# Patient Record
Sex: Female | Born: 1960 | Race: White | Hispanic: No | Smoking: Current every day smoker
Health system: Southern US, Community
[De-identification: ages and names within clinical notes are randomized; demographics above are authoritative.]

## PROBLEM LIST (undated history)

## (undated) DIAGNOSIS — E042 Nontoxic multinodular goiter: Secondary | ICD-10-CM

## (undated) DIAGNOSIS — G4733 Obstructive sleep apnea (adult) (pediatric): Secondary | ICD-10-CM

## (undated) DIAGNOSIS — M5136 Other intervertebral disc degeneration, lumbar region: Secondary | ICD-10-CM

## (undated) DIAGNOSIS — F32A Depression, unspecified: Secondary | ICD-10-CM

## (undated) DIAGNOSIS — I10 Essential (primary) hypertension: Secondary | ICD-10-CM

## (undated) DIAGNOSIS — J45909 Unspecified asthma, uncomplicated: Secondary | ICD-10-CM

## (undated) DIAGNOSIS — K432 Incisional hernia without obstruction or gangrene: Secondary | ICD-10-CM

## (undated) DIAGNOSIS — F1990 Other psychoactive substance use, unspecified, uncomplicated: Secondary | ICD-10-CM

## (undated) DIAGNOSIS — K219 Gastro-esophageal reflux disease without esophagitis: Secondary | ICD-10-CM

## (undated) DIAGNOSIS — F17200 Nicotine dependence, unspecified, uncomplicated: Secondary | ICD-10-CM

## (undated) DIAGNOSIS — F319 Bipolar disorder, unspecified: Secondary | ICD-10-CM

## (undated) DIAGNOSIS — E039 Hypothyroidism, unspecified: Secondary | ICD-10-CM

## (undated) DIAGNOSIS — F1010 Alcohol abuse, uncomplicated: Secondary | ICD-10-CM

## (undated) DIAGNOSIS — R0989 Other specified symptoms and signs involving the circulatory and respiratory systems: Secondary | ICD-10-CM

## (undated) DIAGNOSIS — M1990 Unspecified osteoarthritis, unspecified site: Secondary | ICD-10-CM

## (undated) DIAGNOSIS — E871 Hypo-osmolality and hyponatremia: Secondary | ICD-10-CM

## (undated) DIAGNOSIS — R0600 Dyspnea, unspecified: Secondary | ICD-10-CM

## (undated) DIAGNOSIS — E079 Disorder of thyroid, unspecified: Secondary | ICD-10-CM

## (undated) DIAGNOSIS — B1920 Unspecified viral hepatitis C without hepatic coma: Secondary | ICD-10-CM

## (undated) DIAGNOSIS — D649 Anemia, unspecified: Secondary | ICD-10-CM

## (undated) DIAGNOSIS — I639 Cerebral infarction, unspecified: Secondary | ICD-10-CM

## (undated) DIAGNOSIS — Z8619 Personal history of other infectious and parasitic diseases: Secondary | ICD-10-CM

## (undated) DIAGNOSIS — Z9151 Personal history of suicidal behavior: Secondary | ICD-10-CM

## (undated) DIAGNOSIS — E538 Deficiency of other specified B group vitamins: Secondary | ICD-10-CM

## (undated) DIAGNOSIS — R7303 Prediabetes: Secondary | ICD-10-CM

## (undated) DIAGNOSIS — F988 Other specified behavioral and emotional disorders with onset usually occurring in childhood and adolescence: Secondary | ICD-10-CM

## (undated) DIAGNOSIS — M1731 Unilateral post-traumatic osteoarthritis, right knee: Secondary | ICD-10-CM

## (undated) DIAGNOSIS — F329 Major depressive disorder, single episode, unspecified: Secondary | ICD-10-CM

## (undated) DIAGNOSIS — K5900 Constipation, unspecified: Secondary | ICD-10-CM

## (undated) DIAGNOSIS — R002 Palpitations: Secondary | ICD-10-CM

## (undated) DIAGNOSIS — R0609 Other forms of dyspnea: Secondary | ICD-10-CM

## (undated) DIAGNOSIS — Z8614 Personal history of Methicillin resistant Staphylococcus aureus infection: Secondary | ICD-10-CM

## (undated) DIAGNOSIS — Z9289 Personal history of other medical treatment: Secondary | ICD-10-CM

## (undated) DIAGNOSIS — I700 Atherosclerosis of aorta: Secondary | ICD-10-CM

## (undated) DIAGNOSIS — F1421 Cocaine dependence, in remission: Secondary | ICD-10-CM

## (undated) DIAGNOSIS — Z8489 Family history of other specified conditions: Secondary | ICD-10-CM

## (undated) DIAGNOSIS — M25569 Pain in unspecified knee: Secondary | ICD-10-CM

## (undated) DIAGNOSIS — F419 Anxiety disorder, unspecified: Secondary | ICD-10-CM

## (undated) DIAGNOSIS — E559 Vitamin D deficiency, unspecified: Secondary | ICD-10-CM

## (undated) DIAGNOSIS — E119 Type 2 diabetes mellitus without complications: Secondary | ICD-10-CM

## (undated) DIAGNOSIS — E785 Hyperlipidemia, unspecified: Secondary | ICD-10-CM

## (undated) DIAGNOSIS — M810 Age-related osteoporosis without current pathological fracture: Secondary | ICD-10-CM

## (undated) DIAGNOSIS — Z8719 Personal history of other diseases of the digestive system: Secondary | ICD-10-CM

## (undated) DIAGNOSIS — Z22322 Carrier or suspected carrier of Methicillin resistant Staphylococcus aureus: Secondary | ICD-10-CM

## (undated) DIAGNOSIS — F1011 Alcohol abuse, in remission: Secondary | ICD-10-CM

## (undated) DIAGNOSIS — G4730 Sleep apnea, unspecified: Secondary | ICD-10-CM

## (undated) DIAGNOSIS — Z915 Personal history of self-harm: Secondary | ICD-10-CM

## (undated) DIAGNOSIS — M25559 Pain in unspecified hip: Secondary | ICD-10-CM

## (undated) DIAGNOSIS — J432 Centrilobular emphysema: Secondary | ICD-10-CM

## (undated) DIAGNOSIS — Z96651 Presence of right artificial knee joint: Secondary | ICD-10-CM

## (undated) HISTORY — DX: Personal history of other medical treatment: Z92.89

## (undated) HISTORY — DX: Sleep apnea, unspecified: G47.30

## (undated) HISTORY — DX: Cerebral infarction, unspecified: I63.9

## (undated) HISTORY — DX: Prediabetes: R73.03

## (undated) HISTORY — DX: Palpitations: R00.2

## (undated) HISTORY — DX: Age-related osteoporosis without current pathological fracture: M81.0

## (undated) HISTORY — DX: Anemia, unspecified: D64.9

## (undated) HISTORY — DX: Other specified behavioral and emotional disorders with onset usually occurring in childhood and adolescence: F98.8

## (undated) HISTORY — DX: Hypo-osmolality and hyponatremia: E87.1

## (undated) HISTORY — DX: Obstructive sleep apnea (adult) (pediatric): G47.33

## (undated) HISTORY — DX: Pain in unspecified hip: M25.559

## (undated) HISTORY — DX: Anxiety disorder, unspecified: F41.9

## (undated) HISTORY — DX: Essential (primary) hypertension: I10

## (undated) HISTORY — DX: Pain in unspecified knee: M25.569

## (undated) HISTORY — DX: Hyperlipidemia, unspecified: E78.5

## (undated) HISTORY — DX: Deficiency of other specified B group vitamins: E53.8

## (undated) HISTORY — DX: Personal history of suicidal behavior: Z91.51

## (undated) HISTORY — DX: Other psychoactive substance use, unspecified, uncomplicated: F19.90

## (undated) HISTORY — DX: Depression, unspecified: F32.A

## (undated) HISTORY — DX: Atherosclerosis of aorta: I70.0

## (undated) HISTORY — DX: Personal history of other diseases of the digestive system: Z87.19

## (undated) HISTORY — PX: BILATERAL SALPINGOOPHORECTOMY: SHX1223

## (undated) HISTORY — DX: Personal history of other infectious and parasitic diseases: Z86.19

## (undated) HISTORY — DX: Constipation, unspecified: K59.00

## (undated) HISTORY — DX: Disorder of thyroid, unspecified: E07.9

## (undated) HISTORY — PX: BREAST SURGERY: SHX581

## (undated) HISTORY — DX: Personal history of Methicillin resistant Staphylococcus aureus infection: Z86.14

## (undated) HISTORY — DX: Other forms of dyspnea: R06.09

## (undated) HISTORY — DX: Hypothyroidism, unspecified: E03.9

## (undated) HISTORY — DX: Nontoxic multinodular goiter: E04.2

## (undated) HISTORY — PX: HERNIA REPAIR: SHX51

## (undated) HISTORY — PX: COLONOSCOPY: SHX5424

## (undated) HISTORY — PX: TONSILLECTOMY: SUR1361

## (undated) HISTORY — DX: Centrilobular emphysema: J43.2

## (undated) HISTORY — DX: Carrier or suspected carrier of methicillin resistant Staphylococcus aureus: Z22.322

## (undated) HISTORY — PX: TUBAL LIGATION: SHX77

## (undated) HISTORY — DX: Unspecified osteoarthritis, unspecified site: M19.90

## (undated) HISTORY — DX: Other intervertebral disc degeneration, lumbar region: M51.36

## (undated) HISTORY — DX: Type 2 diabetes mellitus without complications: E11.9

## (undated) HISTORY — DX: Vitamin D deficiency, unspecified: E55.9

## (undated) HISTORY — DX: Major depressive disorder, single episode, unspecified: F32.9

## (undated) HISTORY — DX: Alcohol abuse, uncomplicated: F10.10

## (undated) HISTORY — PX: JOINT REPLACEMENT: SHX530

## (undated) HISTORY — DX: Unilateral post-traumatic osteoarthritis, right knee: M17.31

## (undated) HISTORY — PX: BREAST BIOPSY: SHX20

## (undated) HISTORY — DX: Alcohol abuse, in remission: F10.11

## (undated) HISTORY — DX: Personal history of self-harm: Z91.5

---
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
## 2003-07-10 ENCOUNTER — Other Ambulatory Visit: Payer: Self-pay

## 2004-09-27 ENCOUNTER — Emergency Department: Payer: Self-pay | Admitting: Emergency Medicine

## 2005-05-23 ENCOUNTER — Other Ambulatory Visit: Payer: Self-pay

## 2005-05-23 ENCOUNTER — Emergency Department: Payer: Self-pay | Admitting: Emergency Medicine

## 2005-09-18 ENCOUNTER — Emergency Department: Payer: Self-pay | Admitting: Emergency Medicine

## 2006-05-04 ENCOUNTER — Emergency Department: Payer: Self-pay | Admitting: Emergency Medicine

## 2007-02-13 ENCOUNTER — Ambulatory Visit: Payer: Self-pay

## 2009-03-14 ENCOUNTER — Emergency Department: Payer: Self-pay | Admitting: Emergency Medicine

## 2009-06-08 ENCOUNTER — Emergency Department: Payer: Self-pay | Admitting: Emergency Medicine

## 2009-10-27 ENCOUNTER — Emergency Department: Payer: Self-pay | Admitting: Emergency Medicine

## 2010-11-12 ENCOUNTER — Emergency Department: Payer: Self-pay | Admitting: *Deleted

## 2011-03-24 ENCOUNTER — Emergency Department: Payer: Self-pay | Admitting: *Deleted

## 2011-05-10 DIAGNOSIS — K5792 Diverticulitis of intestine, part unspecified, without perforation or abscess without bleeding: Secondary | ICD-10-CM

## 2011-05-10 DIAGNOSIS — Z8614 Personal history of Methicillin resistant Staphylococcus aureus infection: Secondary | ICD-10-CM

## 2011-05-10 DIAGNOSIS — Z8719 Personal history of other diseases of the digestive system: Secondary | ICD-10-CM

## 2011-05-10 HISTORY — DX: Diverticulitis of intestine, part unspecified, without perforation or abscess without bleeding: K57.92

## 2011-05-10 HISTORY — DX: Personal history of other diseases of the digestive system: Z87.19

## 2011-05-10 HISTORY — DX: Personal history of Methicillin resistant Staphylococcus aureus infection: Z86.14

## 2011-08-28 ENCOUNTER — Emergency Department: Payer: Self-pay | Admitting: Emergency Medicine

## 2011-08-28 DIAGNOSIS — K5732 Diverticulitis of large intestine without perforation or abscess without bleeding: Secondary | ICD-10-CM | POA: Diagnosis not present

## 2011-08-28 DIAGNOSIS — F17200 Nicotine dependence, unspecified, uncomplicated: Secondary | ICD-10-CM | POA: Diagnosis not present

## 2011-08-28 DIAGNOSIS — R1032 Left lower quadrant pain: Secondary | ICD-10-CM | POA: Diagnosis not present

## 2011-08-28 DIAGNOSIS — R52 Pain, unspecified: Secondary | ICD-10-CM | POA: Diagnosis not present

## 2011-08-28 DIAGNOSIS — R109 Unspecified abdominal pain: Secondary | ICD-10-CM | POA: Diagnosis not present

## 2011-08-28 DIAGNOSIS — Z8619 Personal history of other infectious and parasitic diseases: Secondary | ICD-10-CM | POA: Diagnosis not present

## 2011-08-28 DIAGNOSIS — I10 Essential (primary) hypertension: Secondary | ICD-10-CM | POA: Diagnosis not present

## 2011-08-28 DIAGNOSIS — M129 Arthropathy, unspecified: Secondary | ICD-10-CM | POA: Diagnosis not present

## 2011-08-28 DIAGNOSIS — Z79899 Other long term (current) drug therapy: Secondary | ICD-10-CM | POA: Diagnosis not present

## 2011-08-28 LAB — COMPREHENSIVE METABOLIC PANEL
Albumin: 3.5 g/dL (ref 3.4–5.0)
Alkaline Phosphatase: 93 U/L (ref 50–136)
Anion Gap: 10 (ref 7–16)
BUN: 5 mg/dL — ABNORMAL LOW (ref 7–18)
Bilirubin,Total: 0.7 mg/dL (ref 0.2–1.0)
Calcium, Total: 8.6 mg/dL (ref 8.5–10.1)
Chloride: 101 mmol/L (ref 98–107)
Co2: 25 mmol/L (ref 21–32)
Creatinine: 0.72 mg/dL (ref 0.60–1.30)
EGFR (African American): >60
EGFR (Non-African Amer.): >60
Glucose: 95 mg/dL (ref 65–99)
Osmolality: 269 (ref 275–301)
Potassium: 3.7 mmol/L (ref 3.5–5.1)
SGOT(AST): 50 U/L — ABNORMAL HIGH (ref 15–37)
SGPT (ALT): 73 U/L
Sodium: 136 mmol/L (ref 136–145)
Total Protein: 7.7 g/dL (ref 6.4–8.2)

## 2011-08-28 LAB — CBC
HCT: 46.7 % (ref 35.0–47.0)
HGB: 15.6 g/dL (ref 12.0–16.0)
MCH: 32.2 pg (ref 26.0–34.0)
MCHC: 33.4 g/dL (ref 32.0–36.0)
MCV: 96 fL (ref 80–100)
Platelet: 205 10*3/uL (ref 150–440)
RBC: 4.84 10*6/uL (ref 3.80–5.20)
RDW: 12.6 % (ref 11.5–14.5)
WBC: 12.5 10*3/uL — ABNORMAL HIGH (ref 3.6–11.0)

## 2011-09-01 DIAGNOSIS — Z113 Encounter for screening for infections with a predominantly sexual mode of transmission: Secondary | ICD-10-CM | POA: Diagnosis not present

## 2011-09-01 DIAGNOSIS — D279 Benign neoplasm of unspecified ovary: Secondary | ICD-10-CM | POA: Diagnosis not present

## 2011-09-08 DIAGNOSIS — N9489 Other specified conditions associated with female genital organs and menstrual cycle: Secondary | ICD-10-CM | POA: Diagnosis not present

## 2011-09-08 DIAGNOSIS — D279 Benign neoplasm of unspecified ovary: Secondary | ICD-10-CM | POA: Diagnosis not present

## 2011-09-08 DIAGNOSIS — R9389 Abnormal findings on diagnostic imaging of other specified body structures: Secondary | ICD-10-CM | POA: Diagnosis not present

## 2011-09-08 DIAGNOSIS — D251 Intramural leiomyoma of uterus: Secondary | ICD-10-CM | POA: Diagnosis not present

## 2011-09-16 DIAGNOSIS — N9489 Other specified conditions associated with female genital organs and menstrual cycle: Secondary | ICD-10-CM | POA: Diagnosis not present

## 2011-09-27 ENCOUNTER — Ambulatory Visit: Payer: Self-pay | Admitting: Gynecologic Oncology

## 2011-09-27 DIAGNOSIS — F17200 Nicotine dependence, unspecified, uncomplicated: Secondary | ICD-10-CM | POA: Diagnosis not present

## 2011-09-27 DIAGNOSIS — I10 Essential (primary) hypertension: Secondary | ICD-10-CM | POA: Diagnosis not present

## 2011-09-27 DIAGNOSIS — J45909 Unspecified asthma, uncomplicated: Secondary | ICD-10-CM | POA: Diagnosis not present

## 2011-09-27 DIAGNOSIS — K5900 Constipation, unspecified: Secondary | ICD-10-CM | POA: Diagnosis not present

## 2011-09-27 DIAGNOSIS — F319 Bipolar disorder, unspecified: Secondary | ICD-10-CM | POA: Diagnosis not present

## 2011-09-27 DIAGNOSIS — F4310 Post-traumatic stress disorder, unspecified: Secondary | ICD-10-CM | POA: Diagnosis not present

## 2011-09-27 DIAGNOSIS — B1920 Unspecified viral hepatitis C without hepatic coma: Secondary | ICD-10-CM | POA: Diagnosis not present

## 2011-09-27 DIAGNOSIS — G8929 Other chronic pain: Secondary | ICD-10-CM | POA: Diagnosis not present

## 2011-09-27 DIAGNOSIS — R1032 Left lower quadrant pain: Secondary | ICD-10-CM | POA: Diagnosis not present

## 2011-09-27 DIAGNOSIS — Z79899 Other long term (current) drug therapy: Secondary | ICD-10-CM | POA: Diagnosis not present

## 2011-09-27 DIAGNOSIS — J410 Simple chronic bronchitis: Secondary | ICD-10-CM | POA: Diagnosis not present

## 2011-09-27 DIAGNOSIS — M129 Arthropathy, unspecified: Secondary | ICD-10-CM | POA: Diagnosis not present

## 2011-09-27 DIAGNOSIS — M545 Low back pain, unspecified: Secondary | ICD-10-CM | POA: Diagnosis not present

## 2011-09-27 DIAGNOSIS — K5730 Diverticulosis of large intestine without perforation or abscess without bleeding: Secondary | ICD-10-CM | POA: Diagnosis not present

## 2011-09-27 DIAGNOSIS — N839 Noninflammatory disorder of ovary, fallopian tube and broad ligament, unspecified: Secondary | ICD-10-CM | POA: Diagnosis not present

## 2011-10-08 ENCOUNTER — Ambulatory Visit: Payer: Self-pay | Admitting: Gynecologic Oncology

## 2011-11-01 ENCOUNTER — Ambulatory Visit: Payer: Self-pay | Admitting: Obstetrics and Gynecology

## 2011-11-01 DIAGNOSIS — N83209 Unspecified ovarian cyst, unspecified side: Secondary | ICD-10-CM | POA: Diagnosis not present

## 2011-11-01 DIAGNOSIS — I119 Hypertensive heart disease without heart failure: Secondary | ICD-10-CM | POA: Diagnosis not present

## 2011-11-01 DIAGNOSIS — Z01812 Encounter for preprocedural laboratory examination: Secondary | ICD-10-CM | POA: Diagnosis not present

## 2011-11-01 DIAGNOSIS — I10 Essential (primary) hypertension: Secondary | ICD-10-CM | POA: Diagnosis not present

## 2011-11-01 DIAGNOSIS — Z01818 Encounter for other preprocedural examination: Secondary | ICD-10-CM | POA: Diagnosis not present

## 2011-11-01 DIAGNOSIS — Z01810 Encounter for preprocedural cardiovascular examination: Secondary | ICD-10-CM | POA: Diagnosis not present

## 2011-11-01 LAB — COMPREHENSIVE METABOLIC PANEL
Albumin: 3.7 g/dL (ref 3.4–5.0)
Alkaline Phosphatase: 107 U/L (ref 50–136)
Anion Gap: 6 — ABNORMAL LOW (ref 7–16)
BUN: 7 mg/dL (ref 7–18)
Bilirubin,Total: 0.5 mg/dL (ref 0.2–1.0)
Calcium, Total: 9.1 mg/dL (ref 8.5–10.1)
Chloride: 102 mmol/L (ref 98–107)
Co2: 28 mmol/L (ref 21–32)
Creatinine: 0.78 mg/dL (ref 0.60–1.30)
EGFR (African American): >60
EGFR (Non-African Amer.): >60
Glucose: 92 mg/dL (ref 65–99)
Osmolality: 270 (ref 275–301)
Potassium: 3.7 mmol/L (ref 3.5–5.1)
SGOT(AST): 53 U/L — ABNORMAL HIGH (ref 15–37)
SGPT (ALT): 74 U/L
Sodium: 136 mmol/L (ref 136–145)
Total Protein: 7.7 g/dL (ref 6.4–8.2)

## 2011-11-01 LAB — CBC
HCT: 49.6 % — ABNORMAL HIGH (ref 35.0–47.0)
HGB: 16.7 g/dL — ABNORMAL HIGH (ref 12.0–16.0)
MCH: 32.4 pg (ref 26.0–34.0)
MCHC: 33.7 g/dL (ref 32.0–36.0)
MCV: 96 fL (ref 80–100)
Platelet: 211 10*3/uL (ref 150–440)
RBC: 5.15 10*6/uL (ref 3.80–5.20)
RDW: 12.4 % (ref 11.5–14.5)
WBC: 8.5 10*3/uL (ref 3.6–11.0)

## 2011-11-15 ENCOUNTER — Ambulatory Visit: Payer: Self-pay | Admitting: Obstetrics and Gynecology

## 2011-11-15 DIAGNOSIS — Z79899 Other long term (current) drug therapy: Secondary | ICD-10-CM | POA: Diagnosis not present

## 2011-11-15 DIAGNOSIS — F4310 Post-traumatic stress disorder, unspecified: Secondary | ICD-10-CM | POA: Diagnosis not present

## 2011-11-15 DIAGNOSIS — D4959 Neoplasm of unspecified behavior of other genitourinary organ: Secondary | ICD-10-CM | POA: Diagnosis not present

## 2011-11-15 DIAGNOSIS — M069 Rheumatoid arthritis, unspecified: Secondary | ICD-10-CM | POA: Diagnosis not present

## 2011-11-15 DIAGNOSIS — R059 Cough, unspecified: Secondary | ICD-10-CM | POA: Diagnosis not present

## 2011-11-15 DIAGNOSIS — B1920 Unspecified viral hepatitis C without hepatic coma: Secondary | ICD-10-CM | POA: Diagnosis not present

## 2011-11-15 DIAGNOSIS — N83209 Unspecified ovarian cyst, unspecified side: Secondary | ICD-10-CM | POA: Diagnosis not present

## 2011-11-15 DIAGNOSIS — Z888 Allergy status to other drugs, medicaments and biological substances status: Secondary | ICD-10-CM | POA: Diagnosis not present

## 2011-11-15 DIAGNOSIS — I10 Essential (primary) hypertension: Secondary | ICD-10-CM | POA: Diagnosis not present

## 2011-11-15 DIAGNOSIS — F329 Major depressive disorder, single episode, unspecified: Secondary | ICD-10-CM | POA: Diagnosis not present

## 2011-11-15 DIAGNOSIS — F411 Generalized anxiety disorder: Secondary | ICD-10-CM | POA: Diagnosis not present

## 2011-11-15 DIAGNOSIS — F319 Bipolar disorder, unspecified: Secondary | ICD-10-CM | POA: Diagnosis not present

## 2011-11-15 DIAGNOSIS — Z9889 Other specified postprocedural states: Secondary | ICD-10-CM | POA: Diagnosis not present

## 2011-11-15 DIAGNOSIS — F17200 Nicotine dependence, unspecified, uncomplicated: Secondary | ICD-10-CM | POA: Diagnosis not present

## 2011-11-15 DIAGNOSIS — K5730 Diverticulosis of large intestine without perforation or abscess without bleeding: Secondary | ICD-10-CM | POA: Diagnosis not present

## 2011-11-15 DIAGNOSIS — D279 Benign neoplasm of unspecified ovary: Secondary | ICD-10-CM | POA: Diagnosis not present

## 2011-11-15 DIAGNOSIS — Z885 Allergy status to narcotic agent status: Secondary | ICD-10-CM | POA: Diagnosis not present

## 2011-11-16 LAB — PATHOLOGY REPORT

## 2011-12-08 DIAGNOSIS — L02219 Cutaneous abscess of trunk, unspecified: Secondary | ICD-10-CM | POA: Diagnosis not present

## 2011-12-08 DIAGNOSIS — T8140XA Infection following a procedure, unspecified, initial encounter: Secondary | ICD-10-CM | POA: Diagnosis not present

## 2011-12-20 DIAGNOSIS — M25519 Pain in unspecified shoulder: Secondary | ICD-10-CM | POA: Diagnosis not present

## 2011-12-20 DIAGNOSIS — B1710 Acute hepatitis C without hepatic coma: Secondary | ICD-10-CM | POA: Diagnosis not present

## 2011-12-20 DIAGNOSIS — I10 Essential (primary) hypertension: Secondary | ICD-10-CM | POA: Diagnosis not present

## 2011-12-27 DIAGNOSIS — R7401 Elevation of levels of liver transaminase levels: Secondary | ICD-10-CM | POA: Diagnosis not present

## 2011-12-27 DIAGNOSIS — E871 Hypo-osmolality and hyponatremia: Secondary | ICD-10-CM | POA: Diagnosis not present

## 2012-01-17 DIAGNOSIS — B1710 Acute hepatitis C without hepatic coma: Secondary | ICD-10-CM | POA: Diagnosis not present

## 2012-01-17 DIAGNOSIS — K117 Disturbances of salivary secretion: Secondary | ICD-10-CM | POA: Diagnosis not present

## 2012-01-17 DIAGNOSIS — E871 Hypo-osmolality and hyponatremia: Secondary | ICD-10-CM | POA: Diagnosis not present

## 2012-01-22 ENCOUNTER — Emergency Department: Payer: Self-pay | Admitting: Emergency Medicine

## 2012-01-22 DIAGNOSIS — I10 Essential (primary) hypertension: Secondary | ICD-10-CM | POA: Diagnosis not present

## 2012-01-22 DIAGNOSIS — Z79899 Other long term (current) drug therapy: Secondary | ICD-10-CM | POA: Diagnosis not present

## 2012-01-22 DIAGNOSIS — F17200 Nicotine dependence, unspecified, uncomplicated: Secondary | ICD-10-CM | POA: Diagnosis not present

## 2012-01-22 DIAGNOSIS — L0291 Cutaneous abscess, unspecified: Secondary | ICD-10-CM | POA: Diagnosis not present

## 2012-01-22 DIAGNOSIS — IMO0002 Reserved for concepts with insufficient information to code with codable children: Secondary | ICD-10-CM | POA: Diagnosis not present

## 2012-01-22 DIAGNOSIS — L0390 Cellulitis, unspecified: Secondary | ICD-10-CM | POA: Diagnosis not present

## 2012-01-24 ENCOUNTER — Emergency Department: Payer: Self-pay | Admitting: Emergency Medicine

## 2012-01-24 DIAGNOSIS — IMO0002 Reserved for concepts with insufficient information to code with codable children: Secondary | ICD-10-CM | POA: Diagnosis not present

## 2012-01-24 LAB — CBC WITH DIFFERENTIAL/PLATELET
Basophil #: 0.1 10*3/uL (ref 0.0–0.1)
Basophil %: 0.7 %
Eosinophil #: 0.1 10*3/uL (ref 0.0–0.7)
Eosinophil %: 0.8 %
HCT: 45.2 % (ref 35.0–47.0)
HGB: 15.4 g/dL (ref 12.0–16.0)
Lymphocyte #: 2.9 10*3/uL (ref 1.0–3.6)
Lymphocyte %: 21.5 %
MCH: 32.6 pg (ref 26.0–34.0)
MCHC: 34.2 g/dL (ref 32.0–36.0)
MCV: 96 fL (ref 80–100)
Monocyte #: 0.7 x10 3/mm (ref 0.2–0.9)
Monocyte %: 5.2 %
Neutrophil #: 9.7 10*3/uL — ABNORMAL HIGH (ref 1.4–6.5)
Neutrophil %: 71.8 %
Platelet: 268 10*3/uL (ref 150–440)
RBC: 4.73 10*6/uL (ref 3.80–5.20)
RDW: 12.2 % (ref 11.5–14.5)
WBC: 13.5 10*3/uL — ABNORMAL HIGH (ref 3.6–11.0)

## 2012-01-24 LAB — BASIC METABOLIC PANEL
Anion Gap: 9 (ref 7–16)
BUN: 5 mg/dL — ABNORMAL LOW (ref 7–18)
Calcium, Total: 8.9 mg/dL (ref 8.5–10.1)
Chloride: 99 mmol/L (ref 98–107)
Co2: 25 mmol/L (ref 21–32)
Creatinine: 0.84 mg/dL (ref 0.60–1.30)
EGFR (African American): >60
EGFR (Non-African Amer.): >60
Glucose: 114 mg/dL — ABNORMAL HIGH (ref 65–99)
Osmolality: 264 (ref 275–301)
Potassium: 4 mmol/L (ref 3.5–5.1)
Sodium: 133 mmol/L — ABNORMAL LOW (ref 136–145)

## 2012-01-26 ENCOUNTER — Emergency Department: Payer: Self-pay | Admitting: Emergency Medicine

## 2012-01-26 DIAGNOSIS — I10 Essential (primary) hypertension: Secondary | ICD-10-CM | POA: Diagnosis not present

## 2012-01-26 DIAGNOSIS — F17200 Nicotine dependence, unspecified, uncomplicated: Secondary | ICD-10-CM | POA: Diagnosis not present

## 2012-01-26 DIAGNOSIS — Z79899 Other long term (current) drug therapy: Secondary | ICD-10-CM | POA: Diagnosis not present

## 2012-01-26 DIAGNOSIS — R109 Unspecified abdominal pain: Secondary | ICD-10-CM | POA: Diagnosis not present

## 2012-01-26 LAB — CBC
HCT: 44.1 % (ref 35.0–47.0)
HGB: 15.6 g/dL (ref 12.0–16.0)
MCH: 33.6 pg (ref 26.0–34.0)
MCHC: 35.4 g/dL (ref 32.0–36.0)
MCV: 95 fL (ref 80–100)
Platelet: 201 10*3/uL (ref 150–440)
RBC: 4.64 10*6/uL (ref 3.80–5.20)
RDW: 12.4 % (ref 11.5–14.5)
WBC: 6.6 10*3/uL (ref 3.6–11.0)

## 2012-01-26 LAB — URINALYSIS, COMPLETE
Bacteria: NONE SEEN
Bilirubin,UR: NEGATIVE
Blood: NEGATIVE
Glucose,UR: NEGATIVE mg/dL (ref 0–75)
Ketone: NEGATIVE
Leukocyte Esterase: NEGATIVE
Nitrite: NEGATIVE
Ph: 5 (ref 4.5–8.0)
Protein: NEGATIVE
RBC,UR: 2 /HPF (ref 0–5)
Specific Gravity: 1.016 (ref 1.003–1.030)
Squamous Epithelial: 4
WBC UR: 1 /HPF (ref 0–5)

## 2012-01-26 LAB — COMPREHENSIVE METABOLIC PANEL
Albumin: 3.2 g/dL — ABNORMAL LOW (ref 3.4–5.0)
Alkaline Phosphatase: 86 U/L (ref 50–136)
Anion Gap: 8 (ref 7–16)
BUN: 5 mg/dL — ABNORMAL LOW (ref 7–18)
Bilirubin,Total: 0.2 mg/dL (ref 0.2–1.0)
Calcium, Total: 9.1 mg/dL (ref 8.5–10.1)
Chloride: 104 mmol/L (ref 98–107)
Co2: 23 mmol/L (ref 21–32)
Creatinine: 0.88 mg/dL (ref 0.60–1.30)
EGFR (African American): >60
EGFR (Non-African Amer.): >60
Glucose: 94 mg/dL (ref 65–99)
Osmolality: 267 (ref 275–301)
Potassium: 3.7 mmol/L (ref 3.5–5.1)
SGOT(AST): 26 U/L (ref 15–37)
SGPT (ALT): 40 U/L (ref 12–78)
Sodium: 135 mmol/L — ABNORMAL LOW (ref 136–145)
Total Protein: 6.9 g/dL (ref 6.4–8.2)

## 2012-01-26 LAB — WOUND CULTURE

## 2012-01-26 LAB — LIPASE, BLOOD: Lipase: 83 U/L (ref 73–393)

## 2012-01-27 DIAGNOSIS — IMO0002 Reserved for concepts with insufficient information to code with codable children: Secondary | ICD-10-CM | POA: Diagnosis not present

## 2012-01-28 LAB — WOUND CULTURE

## 2012-02-08 DIAGNOSIS — IMO0002 Reserved for concepts with insufficient information to code with codable children: Secondary | ICD-10-CM | POA: Diagnosis not present

## 2012-02-09 DIAGNOSIS — Z23 Encounter for immunization: Secondary | ICD-10-CM | POA: Diagnosis not present

## 2012-02-28 DIAGNOSIS — F259 Schizoaffective disorder, unspecified: Secondary | ICD-10-CM | POA: Diagnosis not present

## 2012-03-09 DIAGNOSIS — K5732 Diverticulitis of large intestine without perforation or abscess without bleeding: Secondary | ICD-10-CM | POA: Diagnosis not present

## 2012-03-09 DIAGNOSIS — Z22322 Carrier or suspected carrier of Methicillin resistant Staphylococcus aureus: Secondary | ICD-10-CM

## 2012-03-09 DIAGNOSIS — I10 Essential (primary) hypertension: Secondary | ICD-10-CM | POA: Diagnosis not present

## 2012-03-09 DIAGNOSIS — R109 Unspecified abdominal pain: Secondary | ICD-10-CM | POA: Diagnosis not present

## 2012-03-09 HISTORY — DX: Carrier or suspected carrier of methicillin resistant Staphylococcus aureus: Z22.322

## 2012-03-12 ENCOUNTER — Ambulatory Visit: Payer: Self-pay | Admitting: Family Medicine

## 2012-03-12 DIAGNOSIS — R109 Unspecified abdominal pain: Secondary | ICD-10-CM | POA: Diagnosis not present

## 2012-03-12 DIAGNOSIS — E785 Hyperlipidemia, unspecified: Secondary | ICD-10-CM | POA: Diagnosis not present

## 2012-03-16 ENCOUNTER — Ambulatory Visit: Payer: Self-pay | Admitting: Family Medicine

## 2012-03-16 DIAGNOSIS — K439 Ventral hernia without obstruction or gangrene: Secondary | ICD-10-CM | POA: Diagnosis not present

## 2012-03-16 DIAGNOSIS — M47817 Spondylosis without myelopathy or radiculopathy, lumbosacral region: Secondary | ICD-10-CM | POA: Diagnosis not present

## 2012-03-16 DIAGNOSIS — R109 Unspecified abdominal pain: Secondary | ICD-10-CM | POA: Diagnosis not present

## 2012-03-27 DIAGNOSIS — F259 Schizoaffective disorder, unspecified: Secondary | ICD-10-CM | POA: Diagnosis not present

## 2012-03-28 DIAGNOSIS — K432 Incisional hernia without obstruction or gangrene: Secondary | ICD-10-CM | POA: Diagnosis not present

## 2012-04-10 DIAGNOSIS — L0390 Cellulitis, unspecified: Secondary | ICD-10-CM | POA: Diagnosis not present

## 2012-04-10 DIAGNOSIS — L0291 Cutaneous abscess, unspecified: Secondary | ICD-10-CM | POA: Diagnosis not present

## 2012-04-11 DIAGNOSIS — L02219 Cutaneous abscess of trunk, unspecified: Secondary | ICD-10-CM | POA: Diagnosis not present

## 2012-04-11 DIAGNOSIS — K432 Incisional hernia without obstruction or gangrene: Secondary | ICD-10-CM | POA: Diagnosis not present

## 2012-04-11 DIAGNOSIS — IMO0002 Reserved for concepts with insufficient information to code with codable children: Secondary | ICD-10-CM | POA: Diagnosis not present

## 2012-04-11 DIAGNOSIS — L03319 Cellulitis of trunk, unspecified: Secondary | ICD-10-CM | POA: Diagnosis not present

## 2012-04-11 DIAGNOSIS — F259 Schizoaffective disorder, unspecified: Secondary | ICD-10-CM | POA: Diagnosis not present

## 2012-04-12 DIAGNOSIS — IMO0002 Reserved for concepts with insufficient information to code with codable children: Secondary | ICD-10-CM | POA: Diagnosis not present

## 2012-04-12 DIAGNOSIS — K432 Incisional hernia without obstruction or gangrene: Secondary | ICD-10-CM | POA: Diagnosis not present

## 2012-04-19 DIAGNOSIS — J069 Acute upper respiratory infection, unspecified: Secondary | ICD-10-CM | POA: Diagnosis not present

## 2012-04-24 DIAGNOSIS — F259 Schizoaffective disorder, unspecified: Secondary | ICD-10-CM | POA: Diagnosis not present

## 2012-05-08 DIAGNOSIS — F259 Schizoaffective disorder, unspecified: Secondary | ICD-10-CM | POA: Diagnosis not present

## 2012-05-14 DIAGNOSIS — J189 Pneumonia, unspecified organism: Secondary | ICD-10-CM | POA: Diagnosis not present

## 2012-05-16 DIAGNOSIS — K432 Incisional hernia without obstruction or gangrene: Secondary | ICD-10-CM | POA: Diagnosis not present

## 2012-05-28 DIAGNOSIS — K113 Abscess of salivary gland: Secondary | ICD-10-CM | POA: Diagnosis not present

## 2012-06-05 ENCOUNTER — Emergency Department: Payer: Self-pay | Admitting: Emergency Medicine

## 2012-06-05 DIAGNOSIS — IMO0002 Reserved for concepts with insufficient information to code with codable children: Secondary | ICD-10-CM | POA: Diagnosis not present

## 2012-06-05 DIAGNOSIS — K219 Gastro-esophageal reflux disease without esophagitis: Secondary | ICD-10-CM | POA: Diagnosis not present

## 2012-06-05 DIAGNOSIS — F329 Major depressive disorder, single episode, unspecified: Secondary | ICD-10-CM | POA: Diagnosis not present

## 2012-06-05 DIAGNOSIS — F319 Bipolar disorder, unspecified: Secondary | ICD-10-CM | POA: Diagnosis not present

## 2012-06-05 DIAGNOSIS — F3289 Other specified depressive episodes: Secondary | ICD-10-CM | POA: Diagnosis not present

## 2012-06-05 DIAGNOSIS — I10 Essential (primary) hypertension: Secondary | ICD-10-CM | POA: Diagnosis not present

## 2012-06-05 DIAGNOSIS — S52599A Other fractures of lower end of unspecified radius, initial encounter for closed fracture: Secondary | ICD-10-CM | POA: Diagnosis not present

## 2012-06-05 DIAGNOSIS — F17200 Nicotine dependence, unspecified, uncomplicated: Secondary | ICD-10-CM | POA: Diagnosis not present

## 2012-06-05 DIAGNOSIS — Z79899 Other long term (current) drug therapy: Secondary | ICD-10-CM | POA: Diagnosis not present

## 2012-06-05 DIAGNOSIS — F259 Schizoaffective disorder, unspecified: Secondary | ICD-10-CM | POA: Diagnosis not present

## 2012-06-07 DIAGNOSIS — S5290XA Unspecified fracture of unspecified forearm, initial encounter for closed fracture: Secondary | ICD-10-CM | POA: Diagnosis not present

## 2012-06-12 DIAGNOSIS — S5290XA Unspecified fracture of unspecified forearm, initial encounter for closed fracture: Secondary | ICD-10-CM | POA: Diagnosis not present

## 2012-06-13 ENCOUNTER — Ambulatory Visit: Payer: Self-pay | Admitting: Surgery

## 2012-06-13 DIAGNOSIS — K439 Ventral hernia without obstruction or gangrene: Secondary | ICD-10-CM | POA: Diagnosis not present

## 2012-06-13 DIAGNOSIS — Z01812 Encounter for preprocedural laboratory examination: Secondary | ICD-10-CM | POA: Diagnosis not present

## 2012-06-13 DIAGNOSIS — I10 Essential (primary) hypertension: Secondary | ICD-10-CM | POA: Diagnosis not present

## 2012-06-13 DIAGNOSIS — Z01810 Encounter for preprocedural cardiovascular examination: Secondary | ICD-10-CM | POA: Diagnosis not present

## 2012-06-13 LAB — HEPATIC FUNCTION PANEL A (ARMC)
Albumin: 3.6 g/dL (ref 3.4–5.0)
Alkaline Phosphatase: 96 U/L (ref 50–136)
Bilirubin, Direct: 0.1 mg/dL (ref 0.00–0.20)
Bilirubin,Total: 0.6 mg/dL (ref 0.2–1.0)
SGOT(AST): 40 U/L — ABNORMAL HIGH (ref 15–37)
SGPT (ALT): 53 U/L (ref 12–78)
Total Protein: 7.4 g/dL (ref 6.4–8.2)

## 2012-06-13 LAB — CBC WITH DIFFERENTIAL/PLATELET
Basophil #: 0.1 10*3/uL (ref 0.0–0.1)
Basophil %: 0.9 %
Eosinophil #: 0.1 10*3/uL (ref 0.0–0.7)
Eosinophil %: 1.4 %
HCT: 44.7 % (ref 35.0–47.0)
HGB: 15.2 g/dL (ref 12.0–16.0)
Lymphocyte #: 3.1 10*3/uL (ref 1.0–3.6)
Lymphocyte %: 39.3 %
MCH: 32.2 pg (ref 26.0–34.0)
MCHC: 34 g/dL (ref 32.0–36.0)
MCV: 95 fL (ref 80–100)
Monocyte #: 0.4 x10 3/mm (ref 0.2–0.9)
Monocyte %: 5.4 %
Neutrophil #: 4.2 10*3/uL (ref 1.4–6.5)
Neutrophil %: 53 %
Platelet: 207 10*3/uL (ref 150–440)
RBC: 4.72 10*6/uL (ref 3.80–5.20)
RDW: 12.4 % (ref 11.5–14.5)
WBC: 8 10*3/uL (ref 3.6–11.0)

## 2012-06-13 LAB — BASIC METABOLIC PANEL
Anion Gap: 8 (ref 7–16)
BUN: 7 mg/dL (ref 7–18)
Calcium, Total: 8.7 mg/dL (ref 8.5–10.1)
Chloride: 100 mmol/L (ref 98–107)
Co2: 26 mmol/L (ref 21–32)
Creatinine: 0.7 mg/dL (ref 0.60–1.30)
EGFR (African American): >60
EGFR (Non-African Amer.): >60
Glucose: 80 mg/dL (ref 65–99)
Osmolality: 265 (ref 275–301)
Potassium: 3.5 mmol/L (ref 3.5–5.1)
Sodium: 134 mmol/L — ABNORMAL LOW (ref 136–145)

## 2012-06-29 DIAGNOSIS — S5290XA Unspecified fracture of unspecified forearm, initial encounter for closed fracture: Secondary | ICD-10-CM | POA: Diagnosis not present

## 2012-07-03 ENCOUNTER — Ambulatory Visit: Payer: Self-pay | Admitting: Surgery

## 2012-07-03 DIAGNOSIS — F319 Bipolar disorder, unspecified: Secondary | ICD-10-CM | POA: Diagnosis not present

## 2012-07-03 DIAGNOSIS — K432 Incisional hernia without obstruction or gangrene: Secondary | ICD-10-CM | POA: Diagnosis not present

## 2012-07-03 DIAGNOSIS — Z885 Allergy status to narcotic agent status: Secondary | ICD-10-CM | POA: Diagnosis not present

## 2012-07-03 DIAGNOSIS — J45909 Unspecified asthma, uncomplicated: Secondary | ICD-10-CM | POA: Diagnosis not present

## 2012-07-03 DIAGNOSIS — M5430 Sciatica, unspecified side: Secondary | ICD-10-CM | POA: Diagnosis not present

## 2012-07-03 DIAGNOSIS — I10 Essential (primary) hypertension: Secondary | ICD-10-CM | POA: Diagnosis not present

## 2012-07-03 DIAGNOSIS — F4310 Post-traumatic stress disorder, unspecified: Secondary | ICD-10-CM | POA: Diagnosis not present

## 2012-07-03 DIAGNOSIS — Z9109 Other allergy status, other than to drugs and biological substances: Secondary | ICD-10-CM | POA: Diagnosis not present

## 2012-07-03 DIAGNOSIS — K439 Ventral hernia without obstruction or gangrene: Secondary | ICD-10-CM | POA: Diagnosis not present

## 2012-07-03 DIAGNOSIS — F17200 Nicotine dependence, unspecified, uncomplicated: Secondary | ICD-10-CM | POA: Diagnosis not present

## 2012-07-03 DIAGNOSIS — Z8619 Personal history of other infectious and parasitic diseases: Secondary | ICD-10-CM | POA: Diagnosis not present

## 2012-07-03 DIAGNOSIS — M069 Rheumatoid arthritis, unspecified: Secondary | ICD-10-CM | POA: Diagnosis not present

## 2012-07-03 DIAGNOSIS — Z79899 Other long term (current) drug therapy: Secondary | ICD-10-CM | POA: Diagnosis not present

## 2012-07-18 DIAGNOSIS — F259 Schizoaffective disorder, unspecified: Secondary | ICD-10-CM | POA: Diagnosis not present

## 2012-07-30 DIAGNOSIS — S5290XA Unspecified fracture of unspecified forearm, initial encounter for closed fracture: Secondary | ICD-10-CM | POA: Diagnosis not present

## 2012-08-15 DIAGNOSIS — Z719 Counseling, unspecified: Secondary | ICD-10-CM | POA: Diagnosis not present

## 2012-08-15 DIAGNOSIS — R5381 Other malaise: Secondary | ICD-10-CM | POA: Diagnosis not present

## 2012-08-15 DIAGNOSIS — F17200 Nicotine dependence, unspecified, uncomplicated: Secondary | ICD-10-CM | POA: Diagnosis not present

## 2012-08-15 DIAGNOSIS — J309 Allergic rhinitis, unspecified: Secondary | ICD-10-CM | POA: Diagnosis not present

## 2012-08-15 DIAGNOSIS — J029 Acute pharyngitis, unspecified: Secondary | ICD-10-CM | POA: Diagnosis not present

## 2012-08-15 DIAGNOSIS — E0590 Thyrotoxicosis, unspecified without thyrotoxic crisis or storm: Secondary | ICD-10-CM | POA: Diagnosis not present

## 2012-08-15 DIAGNOSIS — I10 Essential (primary) hypertension: Secondary | ICD-10-CM | POA: Diagnosis not present

## 2012-09-04 DIAGNOSIS — I10 Essential (primary) hypertension: Secondary | ICD-10-CM | POA: Diagnosis not present

## 2012-09-04 DIAGNOSIS — E0590 Thyrotoxicosis, unspecified without thyrotoxic crisis or storm: Secondary | ICD-10-CM | POA: Diagnosis not present

## 2012-09-04 DIAGNOSIS — B182 Chronic viral hepatitis C: Secondary | ICD-10-CM | POA: Diagnosis not present

## 2012-09-04 DIAGNOSIS — R10819 Abdominal tenderness, unspecified site: Secondary | ICD-10-CM | POA: Diagnosis not present

## 2012-09-07 ENCOUNTER — Ambulatory Visit: Payer: Self-pay | Admitting: Family Medicine

## 2012-09-07 DIAGNOSIS — N63 Unspecified lump in unspecified breast: Secondary | ICD-10-CM | POA: Diagnosis not present

## 2012-09-07 DIAGNOSIS — R229 Localized swelling, mass and lump, unspecified: Secondary | ICD-10-CM | POA: Diagnosis not present

## 2012-09-19 DIAGNOSIS — E0590 Thyrotoxicosis, unspecified without thyrotoxic crisis or storm: Secondary | ICD-10-CM | POA: Diagnosis not present

## 2012-09-19 LAB — TSH: TSH: 0.14 u[IU]/mL — AB (ref 0.41–5.90)

## 2012-09-24 DIAGNOSIS — E0590 Thyrotoxicosis, unspecified without thyrotoxic crisis or storm: Secondary | ICD-10-CM | POA: Diagnosis not present

## 2012-10-03 ENCOUNTER — Ambulatory Visit: Payer: Self-pay

## 2012-10-03 DIAGNOSIS — E0590 Thyrotoxicosis, unspecified without thyrotoxic crisis or storm: Secondary | ICD-10-CM | POA: Diagnosis not present

## 2012-10-04 DIAGNOSIS — M1710 Unilateral primary osteoarthritis, unspecified knee: Secondary | ICD-10-CM | POA: Diagnosis not present

## 2012-10-04 DIAGNOSIS — E0590 Thyrotoxicosis, unspecified without thyrotoxic crisis or storm: Secondary | ICD-10-CM | POA: Diagnosis not present

## 2012-10-04 DIAGNOSIS — R1033 Periumbilical pain: Secondary | ICD-10-CM | POA: Diagnosis not present

## 2012-10-04 DIAGNOSIS — R1013 Epigastric pain: Secondary | ICD-10-CM | POA: Diagnosis not present

## 2012-10-04 DIAGNOSIS — IMO0002 Reserved for concepts with insufficient information to code with codable children: Secondary | ICD-10-CM | POA: Diagnosis not present

## 2012-10-04 DIAGNOSIS — M79609 Pain in unspecified limb: Secondary | ICD-10-CM | POA: Diagnosis not present

## 2012-10-05 DIAGNOSIS — R1013 Epigastric pain: Secondary | ICD-10-CM | POA: Diagnosis not present

## 2012-10-05 DIAGNOSIS — K432 Incisional hernia without obstruction or gangrene: Secondary | ICD-10-CM | POA: Diagnosis not present

## 2012-10-05 DIAGNOSIS — R109 Unspecified abdominal pain: Secondary | ICD-10-CM | POA: Diagnosis not present

## 2012-10-05 DIAGNOSIS — K5900 Constipation, unspecified: Secondary | ICD-10-CM | POA: Diagnosis not present

## 2012-10-08 ENCOUNTER — Ambulatory Visit: Payer: Self-pay | Admitting: Surgery

## 2012-10-10 ENCOUNTER — Emergency Department: Payer: Self-pay | Admitting: Emergency Medicine

## 2012-10-10 DIAGNOSIS — R5383 Other fatigue: Secondary | ICD-10-CM | POA: Diagnosis not present

## 2012-10-10 DIAGNOSIS — I10 Essential (primary) hypertension: Secondary | ICD-10-CM | POA: Diagnosis not present

## 2012-10-10 DIAGNOSIS — Z9889 Other specified postprocedural states: Secondary | ICD-10-CM | POA: Diagnosis not present

## 2012-10-10 DIAGNOSIS — F319 Bipolar disorder, unspecified: Secondary | ICD-10-CM | POA: Diagnosis not present

## 2012-10-10 DIAGNOSIS — B1920 Unspecified viral hepatitis C without hepatic coma: Secondary | ICD-10-CM | POA: Diagnosis not present

## 2012-10-10 DIAGNOSIS — Z79899 Other long term (current) drug therapy: Secondary | ICD-10-CM | POA: Diagnosis not present

## 2012-10-10 DIAGNOSIS — R5381 Other malaise: Secondary | ICD-10-CM | POA: Diagnosis not present

## 2012-10-10 DIAGNOSIS — R55 Syncope and collapse: Secondary | ICD-10-CM | POA: Diagnosis not present

## 2012-10-10 LAB — CBC
HCT: 43.9 % (ref 35.0–47.0)
HGB: 15.4 g/dL (ref 12.0–16.0)
MCH: 32.7 pg (ref 26.0–34.0)
MCHC: 35 g/dL (ref 32.0–36.0)
MCV: 94 fL (ref 80–100)
Platelet: 206 10*3/uL (ref 150–440)
RBC: 4.69 10*6/uL (ref 3.80–5.20)
RDW: 12.8 % (ref 11.5–14.5)
WBC: 7.3 10*3/uL (ref 3.6–11.0)

## 2012-10-10 LAB — BASIC METABOLIC PANEL
Anion Gap: 7 (ref 7–16)
BUN: 7 mg/dL (ref 7–18)
Calcium, Total: 8.9 mg/dL (ref 8.5–10.1)
Chloride: 105 mmol/L (ref 98–107)
Co2: 24 mmol/L (ref 21–32)
Creatinine: 0.64 mg/dL (ref 0.60–1.30)
EGFR (African American): >60
EGFR (Non-African Amer.): >60
Glucose: 95 mg/dL (ref 65–99)
Osmolality: 270 (ref 275–301)
Potassium: 4 mmol/L (ref 3.5–5.1)
Sodium: 136 mmol/L (ref 136–145)

## 2012-10-10 LAB — URINALYSIS, COMPLETE
Bilirubin,UR: NEGATIVE
Blood: NEGATIVE
Glucose,UR: NEGATIVE mg/dL (ref 0–75)
Ketone: NEGATIVE
Nitrite: NEGATIVE
Ph: 6 (ref 4.5–8.0)
Protein: NEGATIVE
RBC,UR: 1 /HPF (ref 0–5)
Specific Gravity: 1.008 (ref 1.003–1.030)
Squamous Epithelial: 4
WBC UR: 4 /HPF (ref 0–5)

## 2012-10-10 LAB — TROPONIN I: Troponin-I: <0.02 ng/mL

## 2012-10-12 ENCOUNTER — Ambulatory Visit: Payer: Self-pay | Admitting: Surgery

## 2012-10-12 DIAGNOSIS — K429 Umbilical hernia without obstruction or gangrene: Secondary | ICD-10-CM | POA: Diagnosis not present

## 2012-10-15 DIAGNOSIS — R1033 Periumbilical pain: Secondary | ICD-10-CM | POA: Diagnosis not present

## 2012-10-16 DIAGNOSIS — I10 Essential (primary) hypertension: Secondary | ICD-10-CM | POA: Diagnosis not present

## 2012-10-16 DIAGNOSIS — K439 Ventral hernia without obstruction or gangrene: Secondary | ICD-10-CM | POA: Diagnosis not present

## 2012-10-16 DIAGNOSIS — R109 Unspecified abdominal pain: Secondary | ICD-10-CM | POA: Diagnosis not present

## 2012-10-19 ENCOUNTER — Emergency Department: Payer: Self-pay | Admitting: Emergency Medicine

## 2012-10-19 DIAGNOSIS — I10 Essential (primary) hypertension: Secondary | ICD-10-CM | POA: Diagnosis not present

## 2012-10-19 DIAGNOSIS — Z79899 Other long term (current) drug therapy: Secondary | ICD-10-CM | POA: Diagnosis not present

## 2012-10-19 DIAGNOSIS — R109 Unspecified abdominal pain: Secondary | ICD-10-CM | POA: Diagnosis not present

## 2012-10-19 DIAGNOSIS — K625 Hemorrhage of anus and rectum: Secondary | ICD-10-CM | POA: Diagnosis not present

## 2012-10-19 DIAGNOSIS — B1920 Unspecified viral hepatitis C without hepatic coma: Secondary | ICD-10-CM | POA: Diagnosis not present

## 2012-10-19 LAB — COMPREHENSIVE METABOLIC PANEL
Albumin: 3.6 g/dL (ref 3.4–5.0)
Alkaline Phosphatase: 123 U/L (ref 50–136)
Anion Gap: 6 — ABNORMAL LOW (ref 7–16)
BUN: 12 mg/dL (ref 7–18)
Bilirubin,Total: 0.5 mg/dL (ref 0.2–1.0)
Calcium, Total: 8.9 mg/dL (ref 8.5–10.1)
Chloride: 104 mmol/L (ref 98–107)
Co2: 25 mmol/L (ref 21–32)
Creatinine: 0.86 mg/dL (ref 0.60–1.30)
EGFR (African American): >60
EGFR (Non-African Amer.): >60
Glucose: 96 mg/dL (ref 65–99)
Osmolality: 270 (ref 275–301)
Potassium: 4.2 mmol/L (ref 3.5–5.1)
SGOT(AST): 123 U/L — ABNORMAL HIGH (ref 15–37)
SGPT (ALT): 177 U/L — ABNORMAL HIGH (ref 12–78)
Sodium: 135 mmol/L — ABNORMAL LOW (ref 136–145)
Total Protein: 7.1 g/dL (ref 6.4–8.2)

## 2012-10-19 LAB — CBC
HCT: 42.8 % (ref 35.0–47.0)
HGB: 15.1 g/dL (ref 12.0–16.0)
MCH: 33.1 pg (ref 26.0–34.0)
MCHC: 35.3 g/dL (ref 32.0–36.0)
MCV: 94 fL (ref 80–100)
Platelet: 183 10*3/uL (ref 150–440)
RBC: 4.57 10*6/uL (ref 3.80–5.20)
RDW: 12.4 % (ref 11.5–14.5)
WBC: 8.5 10*3/uL (ref 3.6–11.0)

## 2012-10-19 LAB — URINALYSIS, COMPLETE
Bacteria: NONE SEEN
Bilirubin,UR: NEGATIVE
Blood: NEGATIVE
Glucose,UR: NEGATIVE mg/dL (ref 0–75)
Ketone: NEGATIVE
Nitrite: NEGATIVE
Ph: 6 (ref 4.5–8.0)
Protein: NEGATIVE
RBC,UR: 1 /HPF (ref 0–5)
Specific Gravity: 1.004 (ref 1.003–1.030)
Squamous Epithelial: 2
WBC UR: <1 /HPF (ref 0–5)

## 2012-10-19 LAB — LIPASE, BLOOD: Lipase: 155 U/L (ref 73–393)

## 2012-11-01 DIAGNOSIS — K432 Incisional hernia without obstruction or gangrene: Secondary | ICD-10-CM | POA: Diagnosis not present

## 2012-11-07 DIAGNOSIS — R946 Abnormal results of thyroid function studies: Secondary | ICD-10-CM | POA: Diagnosis not present

## 2012-11-08 ENCOUNTER — Ambulatory Visit (INDEPENDENT_AMBULATORY_CARE_PROVIDER_SITE_OTHER): Payer: Medicare Other | Admitting: General Surgery

## 2012-11-08 ENCOUNTER — Encounter: Payer: Self-pay | Admitting: General Surgery

## 2012-11-08 VITALS — BP 118/88 | HR 76 | Resp 14 | Ht 62.0 in | Wt 204.0 lb

## 2012-11-08 DIAGNOSIS — K432 Incisional hernia without obstruction or gangrene: Secondary | ICD-10-CM | POA: Insufficient documentation

## 2012-11-08 HISTORY — DX: Incisional hernia without obstruction or gangrene: K43.2

## 2012-11-08 NOTE — Progress Notes (Signed)
Patient ID: Susan [name], female   DOB: May 06, 1961, 52 y.o.   MRN: [id number]  Chief Complaint  Patient presents with  . Abdominal Pain    HPI Susan [name] is a 52 y.o. female.  Patient here today for hernia evaluation located below the umbilicus.  Abdominal pain for about 2 months but over the past 2 weeks it seems to be worse. It seems to be more noticed with positional and changing positions.  Abdominal x ray done [facility] 10-19-12. CT done 10-12-12. Ventral hernia surgery in Feb 2014 by Dr Egbert Garibaldi at [facility].  HPI  Past Medical History  Diagnosis Date  . Thyroid disease     hyperthyroid  . 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 Hepatitis C     Past Surgical History  Procedure Laterality Date  . Salpingoophorectomy Bilateral 11-13-2011  . Hernia repair  06/2011  . Breast surgery Left 20 yrs ago    History reviewed. No pertinent family history.  Social History History  Substance Use Topics  . Smoking status: Current Every Day Smoker -- 1.00 packs/day for 30 years    Types: Cigarettes  . Smokeless tobacco: Never Used  . Alcohol Use: Yes    Allergies  Allergen Reactions  . Lasix (Furosemide)     Electrolyte imbalance    Current Outpatient Prescriptions  Medication Sig Dispense Refill  . amLODipine (NORVASC) 5 MG tablet Take 5 mg by mouth daily.      . clonazePAM (KLONOPIN) 0.5 MG tablet Take 0.5 mg by mouth 2 (two) times daily as needed for anxiety.      . docusate sodium (COLACE) 100 MG capsule Take 100 mg by mouth 2 (two) times daily as needed for constipation.      . DULoxetine (CYMBALTA) 30 MG capsule Take 30 mg by mouth daily.      . fluticasone (FLONASE) 50 MCG/ACT nasal spray Place 2 sprays into the nose daily.      Marland Kitchen gabapentin (NEURONTIN) 600 MG tablet Take 600 mg by mouth 3 (three) times daily.      Marland Kitchen ibuprofen (ADVIL,MOTRIN) 200 MG tablet Take 200 mg by mouth every 6 (six) hours as needed for pain.      Marland Kitchen lisinopril (PRINIVIL,ZESTRIL) 20 MG tablet Take 20 mg by mouth 2  (two) times daily.      Marland Kitchen loratadine (CLARITIN) 10 MG tablet Take 10 mg by mouth daily.      . nebivolol (BYSTOLIC) 5 MG tablet Take 5 mg by mouth daily.      Marland Kitchen omeprazole (PRILOSEC) 20 MG capsule Take 20 mg by mouth daily.      Marland Kitchen oxyCODONE-acetaminophen (PERCOCET/ROXICET) 5-325 MG per tablet Take 1 tablet by mouth every 6 (six) hours as needed for pain.      . polyethylene glycol (MIRALAX / GLYCOLAX) packet Take 17 g by mouth daily.      Rocco Pauls (ADVANCED FORMULA EYE DROPS OP) Apply to eye.       No current facility-administered medications for this visit.    Review of Systems Review of Systems  Constitutional: Negative.   Respiratory: Negative.   Cardiovascular: Negative.   Gastrointestinal: Positive for abdominal pain, constipation and abdominal distention.    Blood pressure 118/88, pulse 76, resp. rate 14, height 5\' 2"  (1.575 m), weight 204 lb (92.534 kg).  Physical Exam Physical Exam  Constitutional: She is oriented to person, place, and time. She appears well-developed and well-nourished.  Eyes: Conjunctivae are normal.  Neck: No thyromegaly present.  Cardiovascular: Normal rate, regular rhythm and normal heart sounds.   Pulses:      Dorsalis pedis pulses are 2+ on the right side, and 2+ on the left side.       Posterior tibial pulses are 2+ on the right side, and 2+ on the left side.  No edema  Pulmonary/Chest: Effort normal and breath sounds normal.  Abdominal: Soft. Bowel sounds are normal. There is tenderness (non focal and mild). A hernia is present. Hernia confirmed positive in the ventral area.    Lymphadenopathy:    She has no cervical adenopathy.  Neurological: She is alert and oriented to person, place, and time.  Skin: Skin is warm and dry.    Data Reviewed Recent CT of abdomen shows a 6 cm defect below umbilicus with herniation of bowel Revoewed prior op note- repair done with Physio mesh. Assessment    Recent ventral hernia, no  apparent findings for epigastric pain which appears to be positions related.    Plan   Discussed repair of the recurrent hernia. Prefer laparoscopic repair but may need open repair and possible component separation. Risks and benefits explained. \She continues to smoke-cautioned on ill effects of this on healing. Encouraged to stop. Pt is agreeable to surgery.        SANKAR,SEEPLAPUTHUR G 11/09/2012, 8:42 AM

## 2012-11-08 NOTE — Patient Instructions (Addendum)
Recommend to stop smoking    Laparoscopic Ventral Hernia Repair Laparoscopic ventral hernia repairis a surgery to fix a ventral hernia. Aventral hernia, also called an incisional hernia, is a bulge of body tissue or intestines that pushes through the front part of the abdomen. This can happen if the connective tissue covering the muscles over the abdomen has a weak spot or is torn because of a surgical cut (incision) from a previous surgery. Laparoscopic ventral hernia repair is often done soon after diagnosis to stop the hernia from getting bigger, becoming uncomfortable, or becoming an emergency. This surgery usually takes about 2 hours, but the time can vary greatly. LET YOUR CAREGIVER KNOW ABOUT:  Any allergies you have.  All medicines you are taking, including steroids, vitamins, herbs, eyedrops, and over-the-counter medicines and creams.  Previous problems you or members of your family have had with the use of anesthetics.  Any blood disorders or bleeding problems you have had.  Past surgeries you have had.  Other health problems you have. RISKS AND COMPLICATIONS  Generally, laparoscopic ventral hernia repair is a safe procedure. However, as with any surgical procedure, complications can occur. Possible complications include:  Bleeding.  Trouble passing urine or having a bowel movement after the operation.  Infection.  Pneumonia.  Blood clots.  Pain in the area of the hernia.  A bulge in the area of the hernia that may be caused by a collection of fluid.  Injury to intestines or other structures in the abdomen.  Return of the hernia after surgery. In some cases, the caregiver may need to stop the laparoscopic procedure and do regular, open surgery. This may be necessary for very difficult hernias, when organs are hard to see, or when bleeding problems occur during surgery. BEFORE THE PROCEDURE   You may need to have blood tests, urine tests, a chest X-ray, or  electrocardiography done before the day of the surgery.  Ask your caregiver about changing or stopping your regular medicines.  You may need to wash with a special type of germ-killing soap.  Do not eat or drink anything for at least 6 hours before the surgery.  Make plans to have someone drive you home after the procedure. PROCEDURE   Small monitors will be put on your body. They are used to check your heart, blood pressure, and oxygen level.  An intravenous (IV) access tube will be put into a vein in your hand or arm. Fluids and medicine will flow directly into your body through the IV tube.  You will be given medicine to make you sleep through the procedure (general anesthetic).  Your abdomen will be cleaned with a special soap to kill any germs on your skin.  Once you are asleep, several small incisions will be made in your abdomen.  The large space in your abdomen will be filled with air so that it expands. This gives the caregiver more room and a better view.  A thin, lighted tube with a tiny camera on the end (laparoscope) is put through a small incision in your abdomen. The camera on the laparoscope sends a picture to a TV screen in the operating room. This gives the caregiver a good view inside the abdomen.  Hollow tubes are put through the other small incisions in your abdomen. The tools needed for the procedure are put through these tubes.  The caregiver puts the tissue or intestines that formed the hernia back in place.  A screen-like patch (mesh) is used  to close the hernia. This helps make the area stronger. Stitches, tacks, or staples are used to keep the mesh in place.  Medicine and a bandage (dressing) or skin glue will be put over the incisions. AFTER THE PROCEDURE   You will stay in a recovery area until the anesthetic wears off. Your blood pressure and pulse will be checked often.  You may be able to go home the same day or may need to stay in the hospital for  1 or 2 days after this surgery. Your caregiver will decide when you can go home.  You may feel some pain. You will likely be given medicine for pain.  You will be urged to do breathing exercises that involve taking deep breaths. This helps prevent a lung infection after a surgery.  You may have to wear compression stockings while you are in the hospital. These stockings help keep blood clots from forming in your legs. Document Released: 04/11/2012 Document Reviewed: 02/15/2012 [facility] Patient Information 2014 [place], [place].

## 2012-11-09 ENCOUNTER — Encounter: Payer: Self-pay | Admitting: General Surgery

## 2012-11-12 ENCOUNTER — Other Ambulatory Visit: Payer: Self-pay | Admitting: General Surgery

## 2012-11-12 DIAGNOSIS — R109 Unspecified abdominal pain: Secondary | ICD-10-CM | POA: Diagnosis not present

## 2012-11-12 DIAGNOSIS — K432 Incisional hernia without obstruction or gangrene: Secondary | ICD-10-CM

## 2012-11-12 DIAGNOSIS — I10 Essential (primary) hypertension: Secondary | ICD-10-CM | POA: Diagnosis not present

## 2012-11-12 DIAGNOSIS — B182 Chronic viral hepatitis C: Secondary | ICD-10-CM | POA: Diagnosis not present

## 2012-11-14 DIAGNOSIS — E0520 Thyrotoxicosis with toxic multinodular goiter without thyrotoxic crisis or storm: Secondary | ICD-10-CM | POA: Diagnosis not present

## 2012-11-20 ENCOUNTER — Ambulatory Visit: Payer: Self-pay | Admitting: General Surgery

## 2012-11-20 DIAGNOSIS — K432 Incisional hernia without obstruction or gangrene: Secondary | ICD-10-CM | POA: Diagnosis not present

## 2012-11-20 DIAGNOSIS — Z01812 Encounter for preprocedural laboratory examination: Secondary | ICD-10-CM | POA: Diagnosis not present

## 2012-11-20 DIAGNOSIS — F259 Schizoaffective disorder, unspecified: Secondary | ICD-10-CM | POA: Diagnosis not present

## 2012-11-20 LAB — CBC WITH DIFFERENTIAL/PLATELET
Basophil #: 0.1 10*3/uL (ref 0.0–0.1)
Basophil %: 1.1 %
Eosinophil #: 0.2 10*3/uL (ref 0.0–0.7)
Eosinophil %: 2.8 %
HCT: 40.9 % (ref 35.0–47.0)
HGB: 14.2 g/dL (ref 12.0–16.0)
Lymphocyte #: 2.3 10*3/uL (ref 1.0–3.6)
Lymphocyte %: 37.7 %
MCH: 33.6 pg (ref 26.0–34.0)
MCHC: 34.8 g/dL (ref 32.0–36.0)
MCV: 97 fL (ref 80–100)
Monocyte #: 0.5 x10 3/mm (ref 0.2–0.9)
Monocyte %: 8.8 %
Neutrophil #: 3 10*3/uL (ref 1.4–6.5)
Neutrophil %: 49.6 %
Platelet: 198 10*3/uL (ref 150–440)
RBC: 4.24 10*6/uL (ref 3.80–5.20)
RDW: 12.4 % (ref 11.5–14.5)
WBC: 6 10*3/uL (ref 3.6–11.0)

## 2012-11-20 LAB — BASIC METABOLIC PANEL
Anion Gap: 7 (ref 7–16)
BUN: 7 mg/dL (ref 7–18)
Calcium, Total: 9.1 mg/dL (ref 8.5–10.1)
Chloride: 103 mmol/L (ref 98–107)
Co2: 26 mmol/L (ref 21–32)
Creatinine: 0.68 mg/dL (ref 0.60–1.30)
EGFR (African American): >60
EGFR (Non-African Amer.): >60
Glucose: 87 mg/dL (ref 65–99)
Osmolality: 269 (ref 275–301)
Potassium: 3.8 mmol/L (ref 3.5–5.1)
Sodium: 136 mmol/L (ref 136–145)

## 2012-11-21 ENCOUNTER — Encounter: Payer: Self-pay | Admitting: General Surgery

## 2012-11-22 ENCOUNTER — Ambulatory Visit: Payer: Self-pay | Admitting: General Surgery

## 2012-11-23 ENCOUNTER — Ambulatory Visit: Payer: Self-pay | Admitting: General Surgery

## 2012-11-23 DIAGNOSIS — Z825 Family history of asthma and other chronic lower respiratory diseases: Secondary | ICD-10-CM | POA: Diagnosis not present

## 2012-11-23 DIAGNOSIS — F319 Bipolar disorder, unspecified: Secondary | ICD-10-CM | POA: Diagnosis not present

## 2012-11-23 DIAGNOSIS — K439 Ventral hernia without obstruction or gangrene: Secondary | ICD-10-CM | POA: Diagnosis not present

## 2012-11-23 DIAGNOSIS — J45909 Unspecified asthma, uncomplicated: Secondary | ICD-10-CM | POA: Diagnosis not present

## 2012-11-23 DIAGNOSIS — F4310 Post-traumatic stress disorder, unspecified: Secondary | ICD-10-CM | POA: Diagnosis not present

## 2012-11-23 DIAGNOSIS — K432 Incisional hernia without obstruction or gangrene: Secondary | ICD-10-CM | POA: Diagnosis not present

## 2012-11-23 DIAGNOSIS — R0902 Hypoxemia: Secondary | ICD-10-CM | POA: Diagnosis not present

## 2012-11-23 DIAGNOSIS — F17200 Nicotine dependence, unspecified, uncomplicated: Secondary | ICD-10-CM | POA: Diagnosis not present

## 2012-11-23 DIAGNOSIS — Z9079 Acquired absence of other genital organ(s): Secondary | ICD-10-CM | POA: Diagnosis not present

## 2012-11-23 DIAGNOSIS — Z888 Allergy status to other drugs, medicaments and biological substances status: Secondary | ICD-10-CM | POA: Diagnosis not present

## 2012-11-23 DIAGNOSIS — D649 Anemia, unspecified: Secondary | ICD-10-CM | POA: Diagnosis not present

## 2012-11-23 DIAGNOSIS — Z6379 Other stressful life events affecting family and household: Secondary | ICD-10-CM | POA: Diagnosis not present

## 2012-11-23 DIAGNOSIS — Z885 Allergy status to narcotic agent status: Secondary | ICD-10-CM | POA: Diagnosis not present

## 2012-11-23 DIAGNOSIS — G589 Mononeuropathy, unspecified: Secondary | ICD-10-CM | POA: Diagnosis not present

## 2012-11-23 DIAGNOSIS — Z8614 Personal history of Methicillin resistant Staphylococcus aureus infection: Secondary | ICD-10-CM | POA: Diagnosis not present

## 2012-11-23 DIAGNOSIS — M069 Rheumatoid arthritis, unspecified: Secondary | ICD-10-CM | POA: Diagnosis not present

## 2012-11-23 DIAGNOSIS — I10 Essential (primary) hypertension: Secondary | ICD-10-CM | POA: Diagnosis not present

## 2012-11-23 DIAGNOSIS — K219 Gastro-esophageal reflux disease without esophagitis: Secondary | ICD-10-CM | POA: Diagnosis not present

## 2012-11-23 DIAGNOSIS — B1920 Unspecified viral hepatitis C without hepatic coma: Secondary | ICD-10-CM | POA: Diagnosis not present

## 2012-11-23 DIAGNOSIS — Z23 Encounter for immunization: Secondary | ICD-10-CM | POA: Diagnosis not present

## 2012-11-23 DIAGNOSIS — Z79899 Other long term (current) drug therapy: Secondary | ICD-10-CM | POA: Diagnosis not present

## 2012-11-23 DIAGNOSIS — F411 Generalized anxiety disorder: Secondary | ICD-10-CM | POA: Diagnosis not present

## 2012-11-24 DIAGNOSIS — Z79899 Other long term (current) drug therapy: Secondary | ICD-10-CM | POA: Diagnosis not present

## 2012-11-24 DIAGNOSIS — K219 Gastro-esophageal reflux disease without esophagitis: Secondary | ICD-10-CM | POA: Diagnosis not present

## 2012-11-24 DIAGNOSIS — I10 Essential (primary) hypertension: Secondary | ICD-10-CM | POA: Diagnosis not present

## 2012-11-24 DIAGNOSIS — Z885 Allergy status to narcotic agent status: Secondary | ICD-10-CM | POA: Diagnosis not present

## 2012-11-24 DIAGNOSIS — Z9889 Other specified postprocedural states: Secondary | ICD-10-CM | POA: Diagnosis not present

## 2012-11-24 DIAGNOSIS — R0902 Hypoxemia: Secondary | ICD-10-CM | POA: Diagnosis not present

## 2012-11-24 DIAGNOSIS — K432 Incisional hernia without obstruction or gangrene: Secondary | ICD-10-CM | POA: Diagnosis not present

## 2012-11-24 DIAGNOSIS — J449 Chronic obstructive pulmonary disease, unspecified: Secondary | ICD-10-CM | POA: Diagnosis not present

## 2012-11-25 DIAGNOSIS — R0902 Hypoxemia: Secondary | ICD-10-CM | POA: Diagnosis not present

## 2012-11-25 DIAGNOSIS — Z79899 Other long term (current) drug therapy: Secondary | ICD-10-CM | POA: Diagnosis not present

## 2012-11-25 DIAGNOSIS — K432 Incisional hernia without obstruction or gangrene: Secondary | ICD-10-CM | POA: Diagnosis not present

## 2012-11-25 DIAGNOSIS — I10 Essential (primary) hypertension: Secondary | ICD-10-CM | POA: Diagnosis not present

## 2012-11-25 DIAGNOSIS — Z885 Allergy status to narcotic agent status: Secondary | ICD-10-CM | POA: Diagnosis not present

## 2012-11-25 DIAGNOSIS — K219 Gastro-esophageal reflux disease without esophagitis: Secondary | ICD-10-CM | POA: Diagnosis not present

## 2012-12-05 ENCOUNTER — Encounter: Payer: Self-pay | Admitting: General Surgery

## 2012-12-06 DIAGNOSIS — E042 Nontoxic multinodular goiter: Secondary | ICD-10-CM | POA: Diagnosis not present

## 2012-12-06 DIAGNOSIS — E041 Nontoxic single thyroid nodule: Secondary | ICD-10-CM | POA: Diagnosis not present

## 2012-12-10 DIAGNOSIS — F410 Panic disorder [episodic paroxysmal anxiety] without agoraphobia: Secondary | ICD-10-CM | POA: Diagnosis not present

## 2012-12-11 ENCOUNTER — Ambulatory Visit (INDEPENDENT_AMBULATORY_CARE_PROVIDER_SITE_OTHER): Payer: Medicare Other | Admitting: General Surgery

## 2012-12-11 ENCOUNTER — Encounter: Payer: Self-pay | Admitting: General Surgery

## 2012-12-11 VITALS — BP 124/72 | HR 76 | Resp 12 | Ht 65.0 in | Wt 150.0 lb

## 2012-12-11 DIAGNOSIS — K432 Incisional hernia without obstruction or gangrene: Secondary | ICD-10-CM

## 2012-12-11 NOTE — Progress Notes (Signed)
Patient here today for her recurrent ventral hernia done 11/23/12. Patient states she is doing well.Repair done via laparoscope using  Physio mash. Pt had moderate hypoxia post op and resolved after 2-3 days. Port site well healed and no sign of infection .Abdomen soft , no recurrence of hernia. Lungs clear. Doing well postop.

## 2012-12-11 NOTE — Patient Instructions (Addendum)
Patient to return in one month. May increase activity slowly back to normal.

## 2012-12-14 ENCOUNTER — Encounter: Payer: Self-pay | Admitting: General Surgery

## 2012-12-20 DIAGNOSIS — R922 Inconclusive mammogram: Secondary | ICD-10-CM | POA: Diagnosis not present

## 2012-12-20 DIAGNOSIS — Z1231 Encounter for screening mammogram for malignant neoplasm of breast: Secondary | ICD-10-CM | POA: Diagnosis not present

## 2012-12-20 DIAGNOSIS — N6489 Other specified disorders of breast: Secondary | ICD-10-CM | POA: Diagnosis not present

## 2013-01-01 DIAGNOSIS — F411 Generalized anxiety disorder: Secondary | ICD-10-CM | POA: Diagnosis not present

## 2013-01-01 DIAGNOSIS — F3132 Bipolar disorder, current episode depressed, moderate: Secondary | ICD-10-CM | POA: Diagnosis not present

## 2013-01-01 DIAGNOSIS — F331 Major depressive disorder, recurrent, moderate: Secondary | ICD-10-CM | POA: Diagnosis not present

## 2013-01-03 DIAGNOSIS — M79609 Pain in unspecified limb: Secondary | ICD-10-CM | POA: Diagnosis not present

## 2013-01-03 DIAGNOSIS — M898X9 Other specified disorders of bone, unspecified site: Secondary | ICD-10-CM | POA: Diagnosis not present

## 2013-01-03 DIAGNOSIS — L259 Unspecified contact dermatitis, unspecified cause: Secondary | ICD-10-CM | POA: Diagnosis not present

## 2013-01-08 DIAGNOSIS — F331 Major depressive disorder, recurrent, moderate: Secondary | ICD-10-CM | POA: Diagnosis not present

## 2013-01-08 DIAGNOSIS — F3132 Bipolar disorder, current episode depressed, moderate: Secondary | ICD-10-CM | POA: Diagnosis not present

## 2013-01-08 DIAGNOSIS — F411 Generalized anxiety disorder: Secondary | ICD-10-CM | POA: Diagnosis not present

## 2013-01-15 ENCOUNTER — Ambulatory Visit: Payer: Self-pay | Admitting: General Surgery

## 2013-01-15 ENCOUNTER — Encounter: Payer: Self-pay | Admitting: General Surgery

## 2013-01-15 VITALS — BP 130/74 | HR 84 | Resp 14 | Ht 64.0 in | Wt 200.0 lb

## 2013-01-15 DIAGNOSIS — K432 Incisional hernia without obstruction or gangrene: Secondary | ICD-10-CM

## 2013-01-15 NOTE — Patient Instructions (Addendum)
Follow up as needed

## 2013-01-15 NOTE — Progress Notes (Signed)
Patient ID: Susan [name], female   DOB: Mar 26, 1961, 52 y.o.   MRN: [id number] Patient here today for post op visit.  Laparoscopy,Repair of Ventral/abdominal wall hernia with Physiomesh on 11-23-12. States she is doing well. Abdomen soft, non tender.  Repair is intact mid abdomen. All port sites well healed.  Doing well.  Return here on an as needed basis.

## 2013-01-23 DIAGNOSIS — B182 Chronic viral hepatitis C: Secondary | ICD-10-CM | POA: Diagnosis not present

## 2013-01-23 DIAGNOSIS — Z1211 Encounter for screening for malignant neoplasm of colon: Secondary | ICD-10-CM | POA: Diagnosis not present

## 2013-01-23 DIAGNOSIS — Z01818 Encounter for other preprocedural examination: Secondary | ICD-10-CM | POA: Diagnosis not present

## 2013-01-25 DIAGNOSIS — F411 Generalized anxiety disorder: Secondary | ICD-10-CM | POA: Diagnosis not present

## 2013-01-25 DIAGNOSIS — F3132 Bipolar disorder, current episode depressed, moderate: Secondary | ICD-10-CM | POA: Diagnosis not present

## 2013-01-25 DIAGNOSIS — F331 Major depressive disorder, recurrent, moderate: Secondary | ICD-10-CM | POA: Diagnosis not present

## 2013-01-28 DIAGNOSIS — F4310 Post-traumatic stress disorder, unspecified: Secondary | ICD-10-CM | POA: Diagnosis not present

## 2013-01-29 ENCOUNTER — Emergency Department: Payer: Self-pay | Admitting: Internal Medicine

## 2013-01-29 DIAGNOSIS — S139XXA Sprain of joints and ligaments of unspecified parts of neck, initial encounter: Secondary | ICD-10-CM | POA: Diagnosis not present

## 2013-01-29 DIAGNOSIS — F329 Major depressive disorder, single episode, unspecified: Secondary | ICD-10-CM | POA: Diagnosis not present

## 2013-01-29 DIAGNOSIS — M25559 Pain in unspecified hip: Secondary | ICD-10-CM | POA: Diagnosis not present

## 2013-01-29 DIAGNOSIS — F3289 Other specified depressive episodes: Secondary | ICD-10-CM | POA: Diagnosis not present

## 2013-01-29 DIAGNOSIS — S339XXA Sprain of unspecified parts of lumbar spine and pelvis, initial encounter: Secondary | ICD-10-CM | POA: Diagnosis not present

## 2013-01-29 DIAGNOSIS — I10 Essential (primary) hypertension: Secondary | ICD-10-CM | POA: Diagnosis not present

## 2013-01-29 DIAGNOSIS — Z79899 Other long term (current) drug therapy: Secondary | ICD-10-CM | POA: Diagnosis not present

## 2013-01-29 DIAGNOSIS — IMO0002 Reserved for concepts with insufficient information to code with codable children: Secondary | ICD-10-CM | POA: Diagnosis not present

## 2013-02-15 DIAGNOSIS — B182 Chronic viral hepatitis C: Secondary | ICD-10-CM | POA: Diagnosis not present

## 2013-02-15 DIAGNOSIS — R7402 Elevation of levels of lactic acid dehydrogenase (LDH): Secondary | ICD-10-CM | POA: Diagnosis not present

## 2013-02-15 DIAGNOSIS — R7401 Elevation of levels of liver transaminase levels: Secondary | ICD-10-CM | POA: Diagnosis not present

## 2013-02-21 ENCOUNTER — Ambulatory Visit: Payer: Self-pay | Admitting: Gastroenterology

## 2013-02-21 DIAGNOSIS — D126 Benign neoplasm of colon, unspecified: Secondary | ICD-10-CM | POA: Diagnosis not present

## 2013-02-21 DIAGNOSIS — K5730 Diverticulosis of large intestine without perforation or abscess without bleeding: Secondary | ICD-10-CM | POA: Diagnosis not present

## 2013-02-21 DIAGNOSIS — Z1211 Encounter for screening for malignant neoplasm of colon: Secondary | ICD-10-CM | POA: Diagnosis not present

## 2013-02-22 LAB — PATHOLOGY REPORT

## 2013-02-25 DIAGNOSIS — F4310 Post-traumatic stress disorder, unspecified: Secondary | ICD-10-CM | POA: Diagnosis not present

## 2013-03-04 DIAGNOSIS — B1920 Unspecified viral hepatitis C without hepatic coma: Secondary | ICD-10-CM | POA: Diagnosis not present

## 2013-03-04 DIAGNOSIS — Z86010 Personal history of colonic polyps: Secondary | ICD-10-CM | POA: Diagnosis not present

## 2013-03-13 ENCOUNTER — Emergency Department: Payer: Self-pay | Admitting: Emergency Medicine

## 2013-03-13 DIAGNOSIS — R52 Pain, unspecified: Secondary | ICD-10-CM | POA: Diagnosis not present

## 2013-03-13 DIAGNOSIS — R079 Chest pain, unspecified: Secondary | ICD-10-CM | POA: Diagnosis not present

## 2013-03-13 DIAGNOSIS — I10 Essential (primary) hypertension: Secondary | ICD-10-CM | POA: Diagnosis not present

## 2013-03-13 DIAGNOSIS — F319 Bipolar disorder, unspecified: Secondary | ICD-10-CM | POA: Diagnosis not present

## 2013-03-13 DIAGNOSIS — Z79899 Other long term (current) drug therapy: Secondary | ICD-10-CM | POA: Diagnosis not present

## 2013-03-13 DIAGNOSIS — F4310 Post-traumatic stress disorder, unspecified: Secondary | ICD-10-CM | POA: Diagnosis not present

## 2013-03-13 DIAGNOSIS — R072 Precordial pain: Secondary | ICD-10-CM | POA: Diagnosis not present

## 2013-03-13 DIAGNOSIS — B1920 Unspecified viral hepatitis C without hepatic coma: Secondary | ICD-10-CM | POA: Diagnosis not present

## 2013-03-13 DIAGNOSIS — R0789 Other chest pain: Secondary | ICD-10-CM | POA: Diagnosis not present

## 2013-03-13 LAB — CBC
HCT: 43.1 % (ref 35.0–47.0)
HGB: 15.1 g/dL (ref 12.0–16.0)
MCH: 32.7 pg (ref 26.0–34.0)
MCHC: 35.1 g/dL (ref 32.0–36.0)
MCV: 93 fL (ref 80–100)
Platelet: 175 10*3/uL (ref 150–440)
RBC: 4.62 10*6/uL (ref 3.80–5.20)
RDW: 12.2 % (ref 11.5–14.5)
WBC: 9.2 10*3/uL (ref 3.6–11.0)

## 2013-03-13 LAB — URINALYSIS, COMPLETE
Bacteria: NONE SEEN
Bilirubin,UR: NEGATIVE
Blood: NEGATIVE
Glucose,UR: NEGATIVE mg/dL (ref 0–75)
Ketone: NEGATIVE
Leukocyte Esterase: NEGATIVE
Nitrite: NEGATIVE
Ph: 6 (ref 4.5–8.0)
Protein: NEGATIVE
RBC,UR: NONE SEEN /HPF (ref 0–5)
Specific Gravity: 1.002 (ref 1.003–1.030)
Squamous Epithelial: <1
WBC UR: NONE SEEN /HPF (ref 0–5)

## 2013-03-13 LAB — COMPREHENSIVE METABOLIC PANEL
Albumin: 3.5 g/dL (ref 3.4–5.0)
Alkaline Phosphatase: 110 U/L (ref 50–136)
Anion Gap: 1 — ABNORMAL LOW (ref 7–16)
BUN: 8 mg/dL (ref 7–18)
Bilirubin,Total: 0.6 mg/dL (ref 0.2–1.0)
Calcium, Total: 8.9 mg/dL (ref 8.5–10.1)
Chloride: 105 mmol/L (ref 98–107)
Co2: 29 mmol/L (ref 21–32)
Creatinine: 0.76 mg/dL (ref 0.60–1.30)
EGFR (African American): >60
EGFR (Non-African Amer.): >60
Glucose: 82 mg/dL (ref 65–99)
Osmolality: 268 (ref 275–301)
Potassium: 3.6 mmol/L (ref 3.5–5.1)
SGOT(AST): 90 U/L — ABNORMAL HIGH (ref 15–37)
SGPT (ALT): 96 U/L — ABNORMAL HIGH (ref 12–78)
Sodium: 135 mmol/L — ABNORMAL LOW (ref 136–145)
Total Protein: 7.1 g/dL (ref 6.4–8.2)

## 2013-03-13 LAB — CK TOTAL AND CKMB (NOT AT ARMC)
CK, Total: 42 U/L (ref 21–215)
CK-MB: 0.8 ng/mL (ref 0.5–3.6)

## 2013-03-13 LAB — TROPONIN I: Troponin-I: <0.02 ng/mL

## 2013-03-14 DIAGNOSIS — I209 Angina pectoris, unspecified: Secondary | ICD-10-CM | POA: Diagnosis not present

## 2013-03-14 DIAGNOSIS — I119 Hypertensive heart disease without heart failure: Secondary | ICD-10-CM | POA: Diagnosis not present

## 2013-03-14 DIAGNOSIS — E782 Mixed hyperlipidemia: Secondary | ICD-10-CM | POA: Diagnosis not present

## 2013-03-26 DIAGNOSIS — B182 Chronic viral hepatitis C: Secondary | ICD-10-CM | POA: Diagnosis not present

## 2013-03-28 DIAGNOSIS — M76899 Other specified enthesopathies of unspecified lower limb, excluding foot: Secondary | ICD-10-CM | POA: Diagnosis not present

## 2013-04-18 DIAGNOSIS — F4310 Post-traumatic stress disorder, unspecified: Secondary | ICD-10-CM | POA: Diagnosis not present

## 2013-04-23 DIAGNOSIS — B182 Chronic viral hepatitis C: Secondary | ICD-10-CM | POA: Diagnosis not present

## 2013-04-25 DIAGNOSIS — B182 Chronic viral hepatitis C: Secondary | ICD-10-CM | POA: Diagnosis not present

## 2013-05-01 DIAGNOSIS — M76899 Other specified enthesopathies of unspecified lower limb, excluding foot: Secondary | ICD-10-CM | POA: Diagnosis not present

## 2013-05-08 DIAGNOSIS — F4310 Post-traumatic stress disorder, unspecified: Secondary | ICD-10-CM | POA: Diagnosis not present

## 2013-05-14 DIAGNOSIS — I10 Essential (primary) hypertension: Secondary | ICD-10-CM | POA: Diagnosis not present

## 2013-05-14 DIAGNOSIS — Z13220 Encounter for screening for lipoid disorders: Secondary | ICD-10-CM | POA: Diagnosis not present

## 2013-05-14 DIAGNOSIS — IMO0002 Reserved for concepts with insufficient information to code with codable children: Secondary | ICD-10-CM | POA: Diagnosis not present

## 2013-05-14 DIAGNOSIS — K121 Other forms of stomatitis: Secondary | ICD-10-CM | POA: Diagnosis not present

## 2013-05-21 DIAGNOSIS — B182 Chronic viral hepatitis C: Secondary | ICD-10-CM | POA: Diagnosis not present

## 2013-05-21 DIAGNOSIS — E0590 Thyrotoxicosis, unspecified without thyrotoxic crisis or storm: Secondary | ICD-10-CM | POA: Diagnosis not present

## 2013-05-29 DIAGNOSIS — F4310 Post-traumatic stress disorder, unspecified: Secondary | ICD-10-CM | POA: Diagnosis not present

## 2013-06-11 DIAGNOSIS — E041 Nontoxic single thyroid nodule: Secondary | ICD-10-CM | POA: Diagnosis not present

## 2013-06-11 DIAGNOSIS — E0590 Thyrotoxicosis, unspecified without thyrotoxic crisis or storm: Secondary | ICD-10-CM | POA: Diagnosis not present

## 2013-06-14 DIAGNOSIS — I10 Essential (primary) hypertension: Secondary | ICD-10-CM | POA: Diagnosis not present

## 2013-06-18 DIAGNOSIS — E0520 Thyrotoxicosis with toxic multinodular goiter without thyrotoxic crisis or storm: Secondary | ICD-10-CM | POA: Diagnosis not present

## 2013-06-18 DIAGNOSIS — F17200 Nicotine dependence, unspecified, uncomplicated: Secondary | ICD-10-CM | POA: Diagnosis not present

## 2013-06-19 DIAGNOSIS — F4310 Post-traumatic stress disorder, unspecified: Secondary | ICD-10-CM | POA: Diagnosis not present

## 2013-07-01 DIAGNOSIS — I10 Essential (primary) hypertension: Secondary | ICD-10-CM | POA: Diagnosis not present

## 2013-07-16 DIAGNOSIS — B182 Chronic viral hepatitis C: Secondary | ICD-10-CM | POA: Diagnosis not present

## 2013-07-18 DIAGNOSIS — M25559 Pain in unspecified hip: Secondary | ICD-10-CM | POA: Diagnosis not present

## 2013-07-18 DIAGNOSIS — M1610 Unilateral primary osteoarthritis, unspecified hip: Secondary | ICD-10-CM | POA: Diagnosis not present

## 2013-07-18 DIAGNOSIS — M169 Osteoarthritis of hip, unspecified: Secondary | ICD-10-CM | POA: Diagnosis not present

## 2013-07-18 DIAGNOSIS — M5137 Other intervertebral disc degeneration, lumbosacral region: Secondary | ICD-10-CM | POA: Diagnosis not present

## 2013-07-18 DIAGNOSIS — IMO0002 Reserved for concepts with insufficient information to code with codable children: Secondary | ICD-10-CM | POA: Diagnosis not present

## 2013-07-23 ENCOUNTER — Ambulatory Visit: Payer: Self-pay | Admitting: Physical Medicine and Rehabilitation

## 2013-07-23 DIAGNOSIS — M5137 Other intervertebral disc degeneration, lumbosacral region: Secondary | ICD-10-CM | POA: Diagnosis not present

## 2013-07-23 DIAGNOSIS — M47817 Spondylosis without myelopathy or radiculopathy, lumbosacral region: Secondary | ICD-10-CM | POA: Diagnosis not present

## 2013-07-24 DIAGNOSIS — F4310 Post-traumatic stress disorder, unspecified: Secondary | ICD-10-CM | POA: Diagnosis not present

## 2013-07-31 DIAGNOSIS — E559 Vitamin D deficiency, unspecified: Secondary | ICD-10-CM | POA: Diagnosis not present

## 2013-07-31 DIAGNOSIS — D510 Vitamin B12 deficiency anemia due to intrinsic factor deficiency: Secondary | ICD-10-CM | POA: Diagnosis not present

## 2013-07-31 DIAGNOSIS — R5381 Other malaise: Secondary | ICD-10-CM | POA: Diagnosis not present

## 2013-07-31 DIAGNOSIS — I10 Essential (primary) hypertension: Secondary | ICD-10-CM | POA: Diagnosis not present

## 2013-07-31 DIAGNOSIS — R5383 Other fatigue: Secondary | ICD-10-CM | POA: Diagnosis not present

## 2013-07-31 DIAGNOSIS — G4700 Insomnia, unspecified: Secondary | ICD-10-CM | POA: Diagnosis not present

## 2013-07-31 DIAGNOSIS — R7989 Other specified abnormal findings of blood chemistry: Secondary | ICD-10-CM | POA: Diagnosis not present

## 2013-08-15 DIAGNOSIS — M48062 Spinal stenosis, lumbar region with neurogenic claudication: Secondary | ICD-10-CM | POA: Diagnosis not present

## 2013-08-15 DIAGNOSIS — M5137 Other intervertebral disc degeneration, lumbosacral region: Secondary | ICD-10-CM | POA: Diagnosis not present

## 2013-08-15 DIAGNOSIS — IMO0002 Reserved for concepts with insufficient information to code with codable children: Secondary | ICD-10-CM | POA: Diagnosis not present

## 2013-08-16 ENCOUNTER — Ambulatory Visit: Payer: Self-pay | Admitting: Family Medicine

## 2013-08-16 DIAGNOSIS — R918 Other nonspecific abnormal finding of lung field: Secondary | ICD-10-CM | POA: Diagnosis not present

## 2013-08-16 DIAGNOSIS — J029 Acute pharyngitis, unspecified: Secondary | ICD-10-CM | POA: Diagnosis not present

## 2013-08-16 DIAGNOSIS — F17200 Nicotine dependence, unspecified, uncomplicated: Secondary | ICD-10-CM | POA: Diagnosis not present

## 2013-08-16 DIAGNOSIS — R221 Localized swelling, mass and lump, neck: Secondary | ICD-10-CM | POA: Diagnosis not present

## 2013-08-16 DIAGNOSIS — R599 Enlarged lymph nodes, unspecified: Secondary | ICD-10-CM | POA: Diagnosis not present

## 2013-08-16 DIAGNOSIS — R220 Localized swelling, mass and lump, head: Secondary | ICD-10-CM | POA: Diagnosis not present

## 2013-08-20 DIAGNOSIS — G4710 Hypersomnia, unspecified: Secondary | ICD-10-CM | POA: Diagnosis not present

## 2013-08-21 DIAGNOSIS — F4310 Post-traumatic stress disorder, unspecified: Secondary | ICD-10-CM | POA: Diagnosis not present

## 2013-08-22 ENCOUNTER — Ambulatory Visit: Payer: Self-pay | Admitting: Family Medicine

## 2013-08-22 DIAGNOSIS — IMO0002 Reserved for concepts with insufficient information to code with codable children: Secondary | ICD-10-CM | POA: Diagnosis not present

## 2013-08-22 DIAGNOSIS — J029 Acute pharyngitis, unspecified: Secondary | ICD-10-CM | POA: Diagnosis not present

## 2013-08-22 DIAGNOSIS — R220 Localized swelling, mass and lump, head: Secondary | ICD-10-CM | POA: Diagnosis not present

## 2013-08-22 DIAGNOSIS — E041 Nontoxic single thyroid nodule: Secondary | ICD-10-CM | POA: Diagnosis not present

## 2013-08-22 DIAGNOSIS — E049 Nontoxic goiter, unspecified: Secondary | ICD-10-CM | POA: Diagnosis not present

## 2013-08-22 DIAGNOSIS — J9819 Other pulmonary collapse: Secondary | ICD-10-CM | POA: Diagnosis not present

## 2013-08-22 DIAGNOSIS — R221 Localized swelling, mass and lump, neck: Secondary | ICD-10-CM | POA: Diagnosis not present

## 2013-08-22 DIAGNOSIS — F17200 Nicotine dependence, unspecified, uncomplicated: Secondary | ICD-10-CM | POA: Diagnosis not present

## 2013-08-23 DIAGNOSIS — L280 Lichen simplex chronicus: Secondary | ICD-10-CM | POA: Diagnosis not present

## 2013-08-23 DIAGNOSIS — G4700 Insomnia, unspecified: Secondary | ICD-10-CM | POA: Diagnosis not present

## 2013-08-24 DIAGNOSIS — K5790 Diverticulosis of intestine, part unspecified, without perforation or abscess without bleeding: Secondary | ICD-10-CM | POA: Insufficient documentation

## 2013-09-04 DIAGNOSIS — M5137 Other intervertebral disc degeneration, lumbosacral region: Secondary | ICD-10-CM | POA: Diagnosis not present

## 2013-09-04 DIAGNOSIS — IMO0002 Reserved for concepts with insufficient information to code with codable children: Secondary | ICD-10-CM | POA: Diagnosis not present

## 2013-09-18 DIAGNOSIS — F4310 Post-traumatic stress disorder, unspecified: Secondary | ICD-10-CM | POA: Diagnosis not present

## 2013-09-23 DIAGNOSIS — F411 Generalized anxiety disorder: Secondary | ICD-10-CM | POA: Diagnosis not present

## 2013-10-02 DIAGNOSIS — F4310 Post-traumatic stress disorder, unspecified: Secondary | ICD-10-CM | POA: Diagnosis not present

## 2013-10-03 DIAGNOSIS — M5416 Radiculopathy, lumbar region: Secondary | ICD-10-CM | POA: Insufficient documentation

## 2013-10-03 DIAGNOSIS — M5137 Other intervertebral disc degeneration, lumbosacral region: Secondary | ICD-10-CM | POA: Diagnosis not present

## 2013-10-03 DIAGNOSIS — IMO0002 Reserved for concepts with insufficient information to code with codable children: Secondary | ICD-10-CM | POA: Diagnosis not present

## 2013-10-11 DIAGNOSIS — F4310 Post-traumatic stress disorder, unspecified: Secondary | ICD-10-CM | POA: Diagnosis not present

## 2013-10-15 DIAGNOSIS — B182 Chronic viral hepatitis C: Secondary | ICD-10-CM | POA: Diagnosis not present

## 2013-10-15 DIAGNOSIS — E049 Nontoxic goiter, unspecified: Secondary | ICD-10-CM | POA: Diagnosis not present

## 2013-10-16 DIAGNOSIS — F4310 Post-traumatic stress disorder, unspecified: Secondary | ICD-10-CM | POA: Diagnosis not present

## 2013-10-25 DIAGNOSIS — M5137 Other intervertebral disc degeneration, lumbosacral region: Secondary | ICD-10-CM | POA: Diagnosis not present

## 2013-10-25 DIAGNOSIS — IMO0002 Reserved for concepts with insufficient information to code with codable children: Secondary | ICD-10-CM | POA: Diagnosis not present

## 2013-10-31 DIAGNOSIS — F4310 Post-traumatic stress disorder, unspecified: Secondary | ICD-10-CM | POA: Diagnosis not present

## 2013-11-19 DIAGNOSIS — F4310 Post-traumatic stress disorder, unspecified: Secondary | ICD-10-CM | POA: Diagnosis not present

## 2013-11-26 DIAGNOSIS — M5137 Other intervertebral disc degeneration, lumbosacral region: Secondary | ICD-10-CM | POA: Diagnosis not present

## 2013-11-26 DIAGNOSIS — IMO0002 Reserved for concepts with insufficient information to code with codable children: Secondary | ICD-10-CM | POA: Diagnosis not present

## 2013-12-03 DIAGNOSIS — F4310 Post-traumatic stress disorder, unspecified: Secondary | ICD-10-CM | POA: Diagnosis not present

## 2013-12-04 DIAGNOSIS — M545 Low back pain, unspecified: Secondary | ICD-10-CM | POA: Diagnosis not present

## 2013-12-04 DIAGNOSIS — G4733 Obstructive sleep apnea (adult) (pediatric): Secondary | ICD-10-CM | POA: Diagnosis not present

## 2013-12-11 DIAGNOSIS — M545 Low back pain, unspecified: Secondary | ICD-10-CM | POA: Diagnosis not present

## 2013-12-16 DIAGNOSIS — M5430 Sciatica, unspecified side: Secondary | ICD-10-CM | POA: Diagnosis not present

## 2013-12-17 DIAGNOSIS — E0520 Thyrotoxicosis with toxic multinodular goiter without thyrotoxic crisis or storm: Secondary | ICD-10-CM | POA: Diagnosis not present

## 2013-12-17 DIAGNOSIS — E049 Nontoxic goiter, unspecified: Secondary | ICD-10-CM | POA: Diagnosis not present

## 2013-12-18 LAB — TSH: TSH: 0.34 u[IU]/mL — AB (ref <=5.90)

## 2013-12-20 DIAGNOSIS — M5430 Sciatica, unspecified side: Secondary | ICD-10-CM | POA: Diagnosis not present

## 2013-12-24 DIAGNOSIS — E0590 Thyrotoxicosis, unspecified without thyrotoxic crisis or storm: Secondary | ICD-10-CM | POA: Diagnosis not present

## 2013-12-24 DIAGNOSIS — E0520 Thyrotoxicosis with toxic multinodular goiter without thyrotoxic crisis or storm: Secondary | ICD-10-CM | POA: Diagnosis not present

## 2013-12-24 DIAGNOSIS — M5430 Sciatica, unspecified side: Secondary | ICD-10-CM | POA: Diagnosis not present

## 2013-12-24 DIAGNOSIS — F17200 Nicotine dependence, unspecified, uncomplicated: Secondary | ICD-10-CM | POA: Diagnosis not present

## 2013-12-31 DIAGNOSIS — M5430 Sciatica, unspecified side: Secondary | ICD-10-CM | POA: Diagnosis not present

## 2013-12-31 DIAGNOSIS — F4310 Post-traumatic stress disorder, unspecified: Secondary | ICD-10-CM | POA: Diagnosis not present

## 2014-01-02 DIAGNOSIS — IMO0002 Reserved for concepts with insufficient information to code with codable children: Secondary | ICD-10-CM | POA: Diagnosis not present

## 2014-01-02 DIAGNOSIS — M5137 Other intervertebral disc degeneration, lumbosacral region: Secondary | ICD-10-CM | POA: Diagnosis not present

## 2014-01-08 DIAGNOSIS — F4310 Post-traumatic stress disorder, unspecified: Secondary | ICD-10-CM | POA: Diagnosis not present

## 2014-02-14 DIAGNOSIS — F4310 Post-traumatic stress disorder, unspecified: Secondary | ICD-10-CM | POA: Diagnosis not present

## 2014-03-10 ENCOUNTER — Encounter: Payer: Self-pay | Admitting: General Surgery

## 2014-03-11 DIAGNOSIS — G4733 Obstructive sleep apnea (adult) (pediatric): Secondary | ICD-10-CM | POA: Diagnosis not present

## 2014-03-11 DIAGNOSIS — R351 Nocturia: Secondary | ICD-10-CM | POA: Diagnosis not present

## 2014-03-26 DIAGNOSIS — Z720 Tobacco use: Secondary | ICD-10-CM | POA: Diagnosis not present

## 2014-03-26 DIAGNOSIS — R350 Frequency of micturition: Secondary | ICD-10-CM | POA: Diagnosis not present

## 2014-03-26 DIAGNOSIS — I10 Essential (primary) hypertension: Secondary | ICD-10-CM | POA: Diagnosis not present

## 2014-03-26 DIAGNOSIS — E663 Overweight: Secondary | ICD-10-CM | POA: Diagnosis not present

## 2014-03-26 DIAGNOSIS — J392 Other diseases of pharynx: Secondary | ICD-10-CM | POA: Diagnosis not present

## 2014-03-27 DIAGNOSIS — E663 Overweight: Secondary | ICD-10-CM | POA: Diagnosis not present

## 2014-03-27 DIAGNOSIS — R350 Frequency of micturition: Secondary | ICD-10-CM | POA: Diagnosis not present

## 2014-04-02 DIAGNOSIS — F4310 Post-traumatic stress disorder, unspecified: Secondary | ICD-10-CM | POA: Diagnosis not present

## 2014-04-09 DIAGNOSIS — R109 Unspecified abdominal pain: Secondary | ICD-10-CM | POA: Diagnosis not present

## 2014-04-09 DIAGNOSIS — I10 Essential (primary) hypertension: Secondary | ICD-10-CM | POA: Diagnosis not present

## 2014-04-09 DIAGNOSIS — F4310 Post-traumatic stress disorder, unspecified: Secondary | ICD-10-CM | POA: Diagnosis not present

## 2014-04-09 DIAGNOSIS — R350 Frequency of micturition: Secondary | ICD-10-CM | POA: Diagnosis not present

## 2014-04-09 DIAGNOSIS — E663 Overweight: Secondary | ICD-10-CM | POA: Diagnosis not present

## 2014-04-18 ENCOUNTER — Ambulatory Visit: Payer: Self-pay

## 2014-04-18 DIAGNOSIS — N2889 Other specified disorders of kidney and ureter: Secondary | ICD-10-CM | POA: Diagnosis not present

## 2014-04-18 DIAGNOSIS — R109 Unspecified abdominal pain: Secondary | ICD-10-CM | POA: Diagnosis not present

## 2014-04-21 DIAGNOSIS — B1920 Unspecified viral hepatitis C without hepatic coma: Secondary | ICD-10-CM | POA: Diagnosis not present

## 2014-04-23 DIAGNOSIS — F4310 Post-traumatic stress disorder, unspecified: Secondary | ICD-10-CM | POA: Diagnosis not present

## 2014-04-29 ENCOUNTER — Emergency Department: Payer: Self-pay | Admitting: Emergency Medicine

## 2014-04-29 DIAGNOSIS — F4310 Post-traumatic stress disorder, unspecified: Secondary | ICD-10-CM | POA: Diagnosis not present

## 2014-04-29 DIAGNOSIS — R0789 Other chest pain: Secondary | ICD-10-CM | POA: Diagnosis not present

## 2014-04-29 DIAGNOSIS — Z79899 Other long term (current) drug therapy: Secondary | ICD-10-CM | POA: Diagnosis not present

## 2014-04-29 DIAGNOSIS — R079 Chest pain, unspecified: Secondary | ICD-10-CM | POA: Diagnosis not present

## 2014-04-29 DIAGNOSIS — I10 Essential (primary) hypertension: Secondary | ICD-10-CM | POA: Diagnosis not present

## 2014-04-29 DIAGNOSIS — Z720 Tobacco use: Secondary | ICD-10-CM | POA: Diagnosis not present

## 2014-04-29 DIAGNOSIS — Z7951 Long term (current) use of inhaled steroids: Secondary | ICD-10-CM | POA: Diagnosis not present

## 2014-04-29 DIAGNOSIS — Z91040 Latex allergy status: Secondary | ICD-10-CM | POA: Diagnosis not present

## 2014-04-29 DIAGNOSIS — R110 Nausea: Secondary | ICD-10-CM | POA: Diagnosis not present

## 2014-04-29 DIAGNOSIS — R000 Tachycardia, unspecified: Secondary | ICD-10-CM | POA: Diagnosis not present

## 2014-04-29 LAB — BASIC METABOLIC PANEL
Anion Gap: 3 — ABNORMAL LOW (ref 7–16)
BUN: 9 mg/dL (ref 7–18)
Calcium, Total: 9.1 mg/dL (ref 8.5–10.1)
Chloride: 103 mmol/L (ref 98–107)
Co2: 30 mmol/L (ref 21–32)
Creatinine: 0.78 mg/dL (ref 0.60–1.30)
EGFR (African American): >60
EGFR (Non-African Amer.): >60
Glucose: 85 mg/dL (ref 65–99)
Osmolality: 270 (ref 275–301)
Potassium: 4.2 mmol/L (ref 3.5–5.1)
Sodium: 136 mmol/L (ref 136–145)

## 2014-04-29 LAB — CBC
HCT: 46.1 % (ref 35.0–47.0)
HGB: 15.6 g/dL (ref 12.0–16.0)
MCH: 30.9 pg (ref 26.0–34.0)
MCHC: 33.8 g/dL (ref 32.0–36.0)
MCV: 91 fL (ref 80–100)
Platelet: 209 10*3/uL (ref 150–440)
RBC: 5.04 10*6/uL (ref 3.80–5.20)
RDW: 13.1 % (ref 11.5–14.5)
WBC: 8.6 10*3/uL (ref 3.6–11.0)

## 2014-04-29 LAB — TROPONIN I
Troponin-I: <0.02 ng/mL
Troponin-I: <0.02 ng/mL

## 2014-05-05 DIAGNOSIS — R079 Chest pain, unspecified: Secondary | ICD-10-CM | POA: Diagnosis not present

## 2014-05-05 DIAGNOSIS — I10 Essential (primary) hypertension: Secondary | ICD-10-CM | POA: Diagnosis not present

## 2014-05-13 DIAGNOSIS — R350 Frequency of micturition: Secondary | ICD-10-CM | POA: Diagnosis not present

## 2014-05-14 DIAGNOSIS — F4310 Post-traumatic stress disorder, unspecified: Secondary | ICD-10-CM | POA: Diagnosis not present

## 2014-05-14 DIAGNOSIS — R079 Chest pain, unspecified: Secondary | ICD-10-CM | POA: Diagnosis not present

## 2014-05-19 DIAGNOSIS — I10 Essential (primary) hypertension: Secondary | ICD-10-CM | POA: Diagnosis not present

## 2014-05-19 DIAGNOSIS — R079 Chest pain, unspecified: Secondary | ICD-10-CM | POA: Diagnosis not present

## 2014-05-20 DIAGNOSIS — R682 Dry mouth, unspecified: Secondary | ICD-10-CM | POA: Insufficient documentation

## 2014-05-20 DIAGNOSIS — J392 Other diseases of pharynx: Secondary | ICD-10-CM | POA: Insufficient documentation

## 2014-05-21 DIAGNOSIS — F4310 Post-traumatic stress disorder, unspecified: Secondary | ICD-10-CM | POA: Diagnosis not present

## 2014-05-26 DIAGNOSIS — K219 Gastro-esophageal reflux disease without esophagitis: Secondary | ICD-10-CM | POA: Diagnosis not present

## 2014-05-26 DIAGNOSIS — R195 Other fecal abnormalities: Secondary | ICD-10-CM | POA: Diagnosis not present

## 2014-05-26 DIAGNOSIS — G4700 Insomnia, unspecified: Secondary | ICD-10-CM | POA: Diagnosis not present

## 2014-05-26 DIAGNOSIS — E559 Vitamin D deficiency, unspecified: Secondary | ICD-10-CM | POA: Diagnosis not present

## 2014-05-26 DIAGNOSIS — Z23 Encounter for immunization: Secondary | ICD-10-CM | POA: Diagnosis not present

## 2014-05-26 DIAGNOSIS — E538 Deficiency of other specified B group vitamins: Secondary | ICD-10-CM | POA: Diagnosis not present

## 2014-05-26 DIAGNOSIS — Z720 Tobacco use: Secondary | ICD-10-CM | POA: Diagnosis not present

## 2014-05-26 DIAGNOSIS — F17210 Nicotine dependence, cigarettes, uncomplicated: Secondary | ICD-10-CM | POA: Diagnosis not present

## 2014-05-26 DIAGNOSIS — E785 Hyperlipidemia, unspecified: Secondary | ICD-10-CM | POA: Diagnosis not present

## 2014-05-26 LAB — HEPATIC FUNCTION PANEL
ALT: 23 U/L (ref 7–35)
AST: 21 U/L (ref 13–35)
Alkaline Phosphatase: 101 U/L (ref 25–125)
Bilirubin, Total: 0.3 mg/dL

## 2014-05-26 LAB — BASIC METABOLIC PANEL
BUN: 9 mg/dL (ref 4–21)
Creatinine: 0.8 mg/dL (ref 0.5–1.1)
Glucose: 92 mg/dL
Potassium: 4.4 mmol/L (ref 3.4–5.3)
Sodium: 137 mmol/L (ref 137–147)

## 2014-05-26 LAB — LIPID PANEL
Cholesterol: 173 mg/dL (ref 0–200)
HDL: 55 mg/dL (ref 35–70)
LDL Cholesterol: 101 mg/dL
Triglycerides: 83 mg/dL (ref 40–160)

## 2014-05-26 LAB — TSH: TSH: 0.27 u[IU]/mL — AB (ref 0.41–5.90)

## 2014-05-28 DIAGNOSIS — F4310 Post-traumatic stress disorder, unspecified: Secondary | ICD-10-CM | POA: Diagnosis not present

## 2014-06-04 DIAGNOSIS — F4310 Post-traumatic stress disorder, unspecified: Secondary | ICD-10-CM | POA: Diagnosis not present

## 2014-06-06 DIAGNOSIS — E538 Deficiency of other specified B group vitamins: Secondary | ICD-10-CM | POA: Diagnosis not present

## 2014-06-16 DIAGNOSIS — F4310 Post-traumatic stress disorder, unspecified: Secondary | ICD-10-CM | POA: Diagnosis not present

## 2014-06-17 DIAGNOSIS — E538 Deficiency of other specified B group vitamins: Secondary | ICD-10-CM | POA: Diagnosis not present

## 2014-06-18 DIAGNOSIS — F4310 Post-traumatic stress disorder, unspecified: Secondary | ICD-10-CM | POA: Diagnosis not present

## 2014-06-24 DIAGNOSIS — F4310 Post-traumatic stress disorder, unspecified: Secondary | ICD-10-CM | POA: Diagnosis not present

## 2014-06-25 DIAGNOSIS — F4310 Post-traumatic stress disorder, unspecified: Secondary | ICD-10-CM | POA: Diagnosis not present

## 2014-06-26 ENCOUNTER — Ambulatory Visit (INDEPENDENT_AMBULATORY_CARE_PROVIDER_SITE_OTHER): Payer: Medicare Other | Admitting: Endocrinology

## 2014-06-26 ENCOUNTER — Encounter: Payer: Self-pay | Admitting: Endocrinology

## 2014-06-26 VITALS — BP 130/86 | HR 87 | Resp 16 | Ht 62.0 in | Wt 208.0 lb

## 2014-06-26 DIAGNOSIS — F17209 Nicotine dependence, unspecified, with unspecified nicotine-induced disorders: Secondary | ICD-10-CM

## 2014-06-26 DIAGNOSIS — E0590 Thyrotoxicosis, unspecified without thyrotoxic crisis or storm: Secondary | ICD-10-CM

## 2014-06-26 DIAGNOSIS — F17200 Nicotine dependence, unspecified, uncomplicated: Secondary | ICD-10-CM | POA: Insufficient documentation

## 2014-06-26 DIAGNOSIS — F319 Bipolar disorder, unspecified: Secondary | ICD-10-CM | POA: Insufficient documentation

## 2014-06-26 DIAGNOSIS — E0520 Thyrotoxicosis with toxic multinodular goiter without thyrotoxic crisis or storm: Secondary | ICD-10-CM | POA: Diagnosis not present

## 2014-06-26 DIAGNOSIS — I10 Essential (primary) hypertension: Secondary | ICD-10-CM | POA: Insufficient documentation

## 2014-06-26 DIAGNOSIS — B1920 Unspecified viral hepatitis C without hepatic coma: Secondary | ICD-10-CM

## 2014-06-26 HISTORY — DX: Unspecified viral hepatitis C without hepatic coma: B19.20

## 2014-06-26 HISTORY — DX: Nicotine dependence, unspecified, with unspecified nicotine-induced disorders: F17.209

## 2014-06-26 NOTE — Assessment & Plan Note (Addendum)
Discussed with the patient regarding thyroid hormone physiology, symptoms and signs of hyperthyroidism. Discussed possible etiologies for low TSH could indicate early subclinical hyperthyroidism due to Graves disease versus toxic adenoma or toxic MNG versus transient changes due to thyroiditis. In her case, it has been determined most likely as toxic MNG.   She appears clinically euthyroid today without tachycardia and small goiter.  Defer lab check at this time as she has had recent thyroid labs with Dr Sanda Klein.  Will do so at time of next follow up and consider getting thyroid Ab levels. If the labs are stable as compared to prior labs, then could continue to monitor symptoms for now. If they start to show worsening of hyperthyroidism- then treatment could be considered in the future.  Since patient is probably transferring care here will obtain prior thyroid workup done with Dr Nilda Simmer-  nuc scan, last [name], prior FNA.   Feel that her recent weight gain, increased thirst could be related to her meds and I have asked her to follow back with her PCP/psych.

## 2014-06-26 NOTE — Assessment & Plan Note (Addendum)
Discussed about thyroid nodules, etiology, follow up, possible compressive symptoms, possible pathology reports.  Encouraging that dominant left mid pole nodule is benign 11/2012- obtain report.  Obtain last thyroid US report from August 2015. Consider thyroid US for follow up nodules in August 2016.  Recent hoarseness in voice could be related to her smoking and I have encouraged her to quit smoking.

## 2014-06-26 NOTE — Patient Instructions (Signed)
Monitor for symptoms of hyperthyroidism- unintentional weight loss, tremors, palpitations, diarrhea. Notify if they occur more frequently.   Monitor for any enlargement in region in region of thyroid.   Please come back for a follow-up appointment in 2 months

## 2014-06-26 NOTE — Progress Notes (Signed)
Pre visit review using our clinic review tool, if applicable. No additional management support is needed unless otherwise documented below in the visit note. 

## 2014-06-26 NOTE — Progress Notes (Signed)
Reason for visit: Low TSH/hyperthyroidism HPI  Susan [name] is a 54 y.o.-year-old female, referred by her PCP, Enid Derry , for evaluation for hyperthyroidism/low TSH. She was seen by Dr Nilda Simmer August 2015. Initially reported that probably Dr Sanda Klein was not aware that she was seeing Dr Nilda Simmer and the patient was not aware that Dr Nilda Simmer is endocrinologist. At end of visit, is probably transferring care here.    The patient has been seeing Dr Nilda Simmer for the past 2-3 years and has subclinical hyperthyroidism that is being monitored without treatment. She had had serial thyroid US showing a MNG. Most recent thyroid US on 12/17/13 showed stable multiple nodules were present with a stable left left mid pole dominant nodule at 3.32cm. This nodule was benign on FNA 11/2012.   Prior nuclear scan 09/2012 showed that she recd a 145.8 uCi I-123 with normal 6 and 24 hour uptakes at 15.5% and 26% respectively. Scan showed heterogenous pattern with a slight defect along the lateral aspect of the left thyroid lobe. Above information obtained through review of Dr Sollum's note.    The patient denies any FH thyroid cancer. Mom had thyroid surgery for ?goiter. she denies any personal XRT exposure to her neck area. Patient denies any recent hospitalization, illness or major change in his medical history. No recent medication changes. Denies tenderness in neck area.  Denies any recent ingestion of seaweed/kelp or recent exposure to iv contrast dye. No recent steroid use or use of herbal supplements.    I reviewed pt's thyroid tests: Lab Results  Component Value Date   TSH 0.27* 05/26/2014   TSH 0.14* 09/19/2012    Free T4 normal at 0.95 05/26/14  Other pertinent labs are as follows: Lab Results  Component Value Date   AST 21 05/26/2014   Lab Results  Component Value Date   ALT 23 05/26/2014   Main concerning symptoms at this time have been Weight gain and thirst for years but particularly worse since  past 3 months. Recent random sugars and calcium, GFR on lab check with Dr Sanda Klein was normal Jan 2016. She has had 30 lbs weight gain in the past 1 year without any apparent change in her lifestyle, which is pretty healthy by her report. 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 Hair dresser Has not noticed a major change in the size of her thyroid since last visit.    Review of systems:  [ x ] complains of   [  ] denies  [  ] weight loss [ x ]  tremors [x  ] palpitations -when anxious [x  ] diarrhea-on and off [  ] increased anxiety [  ] muscle weakness  [ x ] heat intolerance-last year [ x ] fatigue  [  ] proptosis [  ] problems with eye closure or color vision. [  ] noticing any enlargement in size of thyroid [  ] lumps in neck [x  ] dysphagia -occasionally chokes on her food [ x ] change in voice-recently has been more hoarse on and off, trying to quit smoking, is on Chantix for past 2 weeks [  ]  SOB when lying down   I reviewed her chart and she also has a history of Bipolar disease. I have reviewed the patient's past medical history, family and social history, surgical history, medications and allergies.  Past Medical History  Diagnosis Date  . Thyroid disease     hyperthyroid  .  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 Hepatitis C   . Depression   . Anxiety   . Bipolar 1 disorder   . History of alcohol abuse   . History of hepatitis C   . Hypertension   . History of MRSA infection 2013  . History of diverticulitis 2013  . Sleep apnea    Past Surgical History  Procedure Laterality Date  . Salpingoophorectomy Bilateral 11-13-2011  . Breast surgery Left 20 yrs ago  . Hernia repair  06/2011, July 2014  . Cesarean section     Family History  Problem Relation Age of Onset  . Alcohol abuse Father   . Arthritis Mother   . Asthma Mother   . Mental illness Mother   . Thyroid disease Mother   . Arthritis Brother   . Cancer Brother   . Mental illness Brother    . Alcohol abuse Sister   . Drug abuse Sister   . Mental illness Sister    History   Social History  . Marital Status: Divorced    Spouse Name: N/A  . Number of Children: N/A  . Years of Education: N/A   Occupational History  . Not on file.   Social History Main Topics  . Smoking status: Current Every Day Smoker -- 1.00 packs/day for 30 years    Types: Cigarettes  . Smokeless tobacco: Never Used  . Alcohol Use: Yes  . Drug Use: No  . Sexual Activity: Not on file   Other Topics Concern  . Not on file   Social History Narrative   No current outpatient prescriptions on file prior to visit.   No current facility-administered medications on file prior to visit.   Allergies  Allergen Reactions  . Hydrochlorothiazide Other (See Comments)    Electrolyte imbalance  . Lasix [Furosemide]     Electrolyte imbalance       ROS: Review of Systems: [x]  complains of  [  ] denies General:   [ x ] Recent weight change [ x ] Fatigue  [  ] Loss of appetite Eyes: [ x ]  Vision Difficulty [  ]  Eye pain ENT: [ x ]  Hearing difficulty [  ]  Difficulty Swallowing CVS: [  ] Chest pain [ x ]  Palpitations/Irregular Heart beat [  ]  Shortness of breath lying flat [ x ] Swelling of legs Resp: [  ] Frequent Cough [  ] Shortness of Breath  [  ]  Wheezing GI: [  ] Heartburn  [  ] Nausea or Vomiting  [ x ] Diarrhea [ x ] Constipation  [  ] Abdominal Pain GU: [ x ]  Polyuria  [ x ]  nocturia Bones/joints:  [  ]  Muscle aches  [ x ] Joint Pain  [  ] Bone pain Skin/Hair/Nails: [  ]  Rash  [  ] New stretch marks [x  ]  Itching [  ] Hair loss [  ]  Excessive hair growth Reproduction: [ x ] Low sexual desire , [  ]  Women: Menstrual cycle problems [  ]  Women: Breast Discharge [  ] Men: Difficulty with erections [  ]  Men: Enlarged Breasts CNS: [x  ] Frequent Headaches [ x ] Blurry vision [  ] Tremors [  ] Seizures [  ] Loss of consciousness [  ] Localized weakness Endocrine: [x  ]  Excess thirst [  x ]  Feeling excessively hot [  x ]  Feeling excessively cold Heme: [  ]  Easy bruising [x  ]  Enlarged glands or lumps in neck Allergy: [  ]  Food allergies [  ] Environmental allergies  PE: BP 130/86 mmHg  Pulse 87  Resp 16  Ht 5\' 2"  (1.575 m)  Wt 208 lb (94.348 kg)  BMI 38.03 kg/m2  SpO2 97% Wt Readings from Last 3 Encounters:  06/26/14 208 lb (94.348 kg)  01/15/13 200 lb (90.719 kg)  12/11/12 150 lb (68.04 kg)    HEENT: Isabel/AT, EOMI, no icterus, no proptosis, no chemosis, no mild lid lag, no retraction, eyes close completely Neck: thyroid gland - left lobe irregularity without distinct nodule, non-tender, no erythema, no tracheal deviation; negative Pemberton's sign; no lymphadenopathy; no bruits Lungs: good air entry, clear bilaterally Heart: S1&S2 normal, regular rate & rhythm; no murmurs, rubs or gallops Abd: soft, NT, ND, no HSM, +BS Ext: fine tremor in hands bilaterally, no edema, 2+ DP/PT pulses, good muscle mass Neuro: normal gait, 2+ reflexes bilaterally, normal 5/5 strength, no proximal myopathy  Derm: no pretibial myxoedema/skin dryness   ASSESSMENT: 1. Low TSH/Hyperthyroidism  2. Toxic MNG  PLAN:     Problem List Items Addressed This Visit      Endocrine   Subclinical hyperthyroidism - Primary    Discussed with the patient regarding thyroid hormone physiology, symptoms and signs of hyperthyroidism. Discussed possible etiologies for low TSH could indicate early subclinical hyperthyroidism due to Graves disease versus toxic adenoma or toxic MNG versus transient changes due to thyroiditis. In her case, it has been determined most likely as toxic MNG.   She appears clinically euthyroid today without tachycardia and small goiter.  Defer lab check at this time as she has had recent thyroid labs with Dr Sanda Klein.  Will do so at time of next follow up and consider getting thyroid Ab levels. If the labs are stable as compared to prior labs, then could continue to monitor  symptoms for now. If they start to show worsening of hyperthyroidism- then treatment could be considered in the future.  Since patient is probably transferring care here will obtain prior thyroid workup done with Dr Nilda Simmer-  nuc scan, last [name], prior FNA.   Feel that her recent weight gain, increased thirst could be related to her meds and I have asked her to follow back with her PCP/psych.         Toxic multinodular goiter    Discussed about thyroid nodules, etiology, follow up, possible compressive symptoms, possible pathology reports.  Encouraging that dominant left mid pole nodule is benign 11/2012- obtain report.  Obtain last thyroid US report from August 2015. Consider thyroid US for follow up nodules in August 2016.  Recent hoarseness in voice could be related to her smoking and I have encouraged her to quit smoking.           - RTC in 2 months or sooner if symptoms worsen.    Mauri Temkin [facility] 06/26/2014  12:05 PM

## 2014-06-30 DIAGNOSIS — E538 Deficiency of other specified B group vitamins: Secondary | ICD-10-CM | POA: Diagnosis not present

## 2014-07-02 DIAGNOSIS — E669 Obesity, unspecified: Secondary | ICD-10-CM | POA: Diagnosis not present

## 2014-07-02 DIAGNOSIS — F17290 Nicotine dependence, other tobacco product, uncomplicated: Secondary | ICD-10-CM | POA: Diagnosis not present

## 2014-07-02 DIAGNOSIS — F4310 Post-traumatic stress disorder, unspecified: Secondary | ICD-10-CM | POA: Diagnosis not present

## 2014-07-02 DIAGNOSIS — I10 Essential (primary) hypertension: Secondary | ICD-10-CM | POA: Diagnosis not present

## 2014-07-10 ENCOUNTER — Other Ambulatory Visit: Payer: Self-pay | Admitting: Endocrinology

## 2014-07-15 ENCOUNTER — Ambulatory Visit
Admit: 2014-07-15 | Disposition: A | Discharge status: Home / Self Care | Payer: Self-pay | Attending: Family Medicine | Admitting: Family Medicine

## 2014-07-15 DIAGNOSIS — E785 Hyperlipidemia, unspecified: Secondary | ICD-10-CM | POA: Diagnosis not present

## 2014-07-15 DIAGNOSIS — E669 Obesity, unspecified: Secondary | ICD-10-CM | POA: Diagnosis not present

## 2014-07-15 DIAGNOSIS — F4310 Post-traumatic stress disorder, unspecified: Secondary | ICD-10-CM | POA: Diagnosis not present

## 2014-07-15 DIAGNOSIS — Z713 Dietary counseling and surveillance: Secondary | ICD-10-CM | POA: Diagnosis not present

## 2014-07-15 DIAGNOSIS — I10 Essential (primary) hypertension: Secondary | ICD-10-CM | POA: Diagnosis not present

## 2014-07-16 DIAGNOSIS — M5136 Other intervertebral disc degeneration, lumbar region: Secondary | ICD-10-CM | POA: Diagnosis not present

## 2014-07-16 DIAGNOSIS — M5416 Radiculopathy, lumbar region: Secondary | ICD-10-CM | POA: Diagnosis not present

## 2014-07-23 DIAGNOSIS — F4310 Post-traumatic stress disorder, unspecified: Secondary | ICD-10-CM | POA: Diagnosis not present

## 2014-08-06 IMAGING — NM NM THYROID IMAGING W/ UPTAKE SINGLE (24 HR)
1 series · 3 of 3 positions shown · non-contrast
Comparison: none

REASON FOR EXAM: hyperthyroidism
COMMENTS:

[Series 1000: (id) thyroid scan · 2.40mm/px · 3 of 3 slices shown]
[im 1/3  full-range]
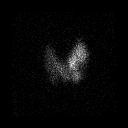
[im 2/3  full-range]
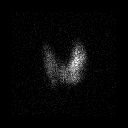
[im 3/3  full-range]
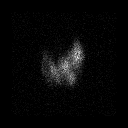

[3 of 3 positions shown; findings below may reference images not displayed]

PROCEDURE:     KNM - KNM THYROID H-FWK 24HR [DATE]  [DATE]

RESULT:     The patient received a dose of 145.8 uCi of 7odine-C1Y. Uptake
values were calculated at 6 hours and 24 hours and/or 15.5% and 26.0%
expectedly. These are within normal limits. Laboratory values indicate
suppression of TSH with elevation of T3. Images demonstrate heterogeneous
pattern of uptake. There is a slight defect along the lateral aspect of the
left thyroid lobe on the anterior and LAO projection which could represent a
small photopenic defect. Ultrasound correlation is recommended.
IMPRESSION: Heterogeneous pattern of uptake within the thyroid.
Possible photopenic area laterally in the mid left thyroid lobe.
Normal-appearing thyroid uptake of iodine.

[REDACTED]

## 2014-08-07 DIAGNOSIS — F4310 Post-traumatic stress disorder, unspecified: Secondary | ICD-10-CM | POA: Diagnosis not present

## 2014-08-08 ENCOUNTER — Ambulatory Visit
Admit: 2014-08-08 | Disposition: A | Discharge status: Home / Self Care | Payer: Self-pay | Attending: Family Medicine | Admitting: Family Medicine

## 2014-08-08 DIAGNOSIS — I10 Essential (primary) hypertension: Secondary | ICD-10-CM | POA: Diagnosis not present

## 2014-08-08 DIAGNOSIS — E669 Obesity, unspecified: Secondary | ICD-10-CM | POA: Diagnosis not present

## 2014-08-08 DIAGNOSIS — Z713 Dietary counseling and surveillance: Secondary | ICD-10-CM | POA: Diagnosis not present

## 2014-08-08 DIAGNOSIS — E785 Hyperlipidemia, unspecified: Secondary | ICD-10-CM | POA: Diagnosis not present

## 2014-08-11 DIAGNOSIS — M5416 Radiculopathy, lumbar region: Secondary | ICD-10-CM | POA: Diagnosis not present

## 2014-08-11 DIAGNOSIS — M5136 Other intervertebral disc degeneration, lumbar region: Secondary | ICD-10-CM | POA: Diagnosis not present

## 2014-08-12 DIAGNOSIS — N393 Stress incontinence (female) (male): Secondary | ICD-10-CM | POA: Diagnosis not present

## 2014-08-12 DIAGNOSIS — E669 Obesity, unspecified: Secondary | ICD-10-CM | POA: Diagnosis not present

## 2014-08-12 DIAGNOSIS — Z713 Dietary counseling and surveillance: Secondary | ICD-10-CM | POA: Diagnosis not present

## 2014-08-12 DIAGNOSIS — I10 Essential (primary) hypertension: Secondary | ICD-10-CM | POA: Diagnosis not present

## 2014-08-12 DIAGNOSIS — R350 Frequency of micturition: Secondary | ICD-10-CM | POA: Diagnosis not present

## 2014-08-12 DIAGNOSIS — E785 Hyperlipidemia, unspecified: Secondary | ICD-10-CM | POA: Diagnosis not present

## 2014-08-12 DIAGNOSIS — R682 Dry mouth, unspecified: Secondary | ICD-10-CM | POA: Diagnosis not present

## 2014-08-20 DIAGNOSIS — J029 Acute pharyngitis, unspecified: Secondary | ICD-10-CM | POA: Diagnosis not present

## 2014-08-20 DIAGNOSIS — I10 Essential (primary) hypertension: Secondary | ICD-10-CM | POA: Diagnosis not present

## 2014-08-27 DIAGNOSIS — M5416 Radiculopathy, lumbar region: Secondary | ICD-10-CM | POA: Diagnosis not present

## 2014-08-27 DIAGNOSIS — M5136 Other intervertebral disc degeneration, lumbar region: Secondary | ICD-10-CM | POA: Diagnosis not present

## 2014-08-28 ENCOUNTER — Ambulatory Visit: Payer: Medicare Other | Admitting: Endocrinology

## 2014-08-29 NOTE — Op Note (Signed)
PATIENT NAME:  Susan [name], Susan [name] MR#:  [id number] DATE OF BIRTH:  Mar 07, 1961  DATE OF PROCEDURE:  07/03/2012  PREOPERATIVE DIAGNOSIS: Incisional ventral hernia.   POSTOPERATIVE DIAGNOSIS: Incisional ventral hernia.   PROCEDURE PERFORMED: Laparoscopic ventral hernia repair with Physiomesh, 10 x 14 cm.   SURGEON: Randi Poullard A. Marina Gravel, M.D.  ASSISTANT: PA student.   TYPE OF ANESTHESIA: General endotracheal and 30 mL of 0.25% plain Marcaine.   INDICATIONS: This is a 54 year old white female who underwent a laparoscopic-assisted oophorectomy in the summer of 2013. She has now developed an incisional ventral hernia. I discussed with her and her family laparoscopic repair with mesh. She understands and wishes to proceed.   FINDINGS: There was a 4 x 4 cm fascial defect in the ventral midline just below the umbilicus. There were no bowel contents or omentum.   SPECIMENS: None.   ESTIMATED BLOOD LOSS: Minimal.   DESCRIPTION OF PROCEDURE: With the patient in the supine position, general oral endotracheal anesthesia was induced. Her abdomen was widely prepped and draped with ChloraPrep solution and Ioban, Foley catheter having been placed and arms tucked at her sides. Timeout was observed.   A Veress needle was placed after elevation of the fascia through a small incision just to the left of the upper midline area. A total of 3.5 liters of CO2 was insufflated. Visiport was then placed under direct visualization. Intraperitoneal position was confirmed by direct visualization and pneumoperitoneum was established to a total of 15 mmHg with CO2 insufflation.   The hernia defect was clearly visible. There was a 4 x 5 cm fascial defect which was measured with an intracorporeal measuring device. Two 5 mm ports were placed on the extreme right subcostal and left subcostal margin. A 10 x 15 cm piece of Physiomesh was brought into the field, oriented 10 cm wide along the right-to-left orientation, and measured 13 cm  long along the cranial-caudal orientation after trimming. Sutures of 0 Ethibond were placed at the 4 quadrants and tied. The hernia mesh was then inserted into the peritoneal cavity, laid flat and then at 4 quadrants utilizing the suture passer transabdominally, the sutures were then sequentially captured and externalized. Ethicon suture strap was then used to circumferentially tack the hernia mesh to the underlying peritoneum and fascia. Sutures were then tied sequentially under direct visualization. Photodocumentation was obtained and placed on the chart. There was no bleeding and no evidence of bowel injury. Ports were then removed under direct visualization. Pneumoperitoneum was released and the 10 mm port site was closed with a figure-of-eight #0 Vicryl suture in vertical orientation. A total of 30 mL of 0.25% plain Marcaine was infiltrated along all skin and fascial incisions prior to closure. A 4-0 Vicryl subcuticular was applied to all skin edges. The most inferior abdominal site for transfixation of the mesh was closed with simple and vertical mattress 4-0 nylon sutures due to persistent skin edge bleeding. Sterile occlusive dressings were placed consisting of benzoin, 1/2-inch Steri-Strips, 4 x 4's and Tegaderm. An abdominal binder was placed. The Foley catheter was removed and the patient was subsequently extubated and taken to the recovery room in stable and satisfactory condition by anesthesia services.   ____________________________ Jeannette How Marina Gravel, MD mab:jm D: 07/03/2012 14:30:58 ET T: 07/03/2012 18:12:21 ET JOB#: [id number]  cc: Elta Guadeloupe A. Marina Gravel, MD, <Dictator> Hortencia Conradi MD ELECTRONICALLY SIGNED 07/04/2012 10:45

## 2014-08-29 NOTE — Consult Note (Signed)
Brief Consult Note: Diagnosis: 1. Hypoxia 2. s/p Ventral Hernia Repair POD # 1. 3. HTN 4. GERD 5. Anxiety/Depression 6. Neuropathy.   Patient was seen by consultant.   Consult note dictated.   Orders entered.   Comments: 54 yo female w/ hx of hTN, GERD, anxiety/depression, Neuropathy, who is s/p ventral hernia repair POD # 1 now is slightly hypoxic.   1. Hypoxia - likely related to underlying COPD and also pt. taking shallow breaths due to abdominal pain.  - will place on scheduled Symbicort, PRN Duonebs, Incentive Spirometry.  - ambulate and assess O2 sats in a.m. tomorrow.   2. GERD - cont. Prilosec  3. HTN - cont. Norvasc, Bystolic, Lisinopril.   4. Depression/Anxiety - cont. Klonopin, Cymbalta  5. s/p Vental Hernia Repair - cont. care as per surgery.   Thanks for the consult and will follow with you.  Job # [id number].  Electronic Signatures: Henreitta Leber (MD)  (Signed 19-Jul-14 11:17)  Authored: Brief Consult Note   Last Updated: 19-Jul-14 11:17 by Henreitta Leber (MD)

## 2014-08-29 NOTE — Op Note (Signed)
PATIENT NAME:  Susan [name], Susan [name] MR#:  [id number] DATE OF BIRTH:  January 16, 1961  DATE OF PROCEDURE:  11/23/2012  PREOPERATIVE DIAGNOSIS: Recurrent ventral hernia.   POSTOPERATIVE DIAGNOSIS: Recurrent ventral hernia.   OPERATION: Laparoscopy and repair of recurrent ventral hernia.   SURGEON: S.G. Jamal Collin, M.D.   ANESTHESIA: General.   COMPLICATIONS: None.   ESTIMATED BLOOD LOSS: Minimal.   DRAINS: None.   DESCRIPTION OF PROCEDURE: The patient was put to sleep in the supine position on the operating table. A timeout procedure was performed. The abdomen was prepped and draped out as a sterile field. This patient has had a previous repair, also with a Physiomesh, of a hernia that was located near the umbilicus. The hernial opening was noted to be below the level of the umbilicus and fairly sizable. A small incision was made in the left upper quadrant close to the costal margin, and a Veress needle with the InnerDyne sleeve was positioned in the peritoneal cavity and verified with the hanging drop method. A 10 mm port was placed which subsequently was switched to an 11 mm XL port. With adequate pneumoperitoneum, the camera was used with good visualization of the peritoneal cavity. There was some adhesion of the omentum to the previously placed mesh which was relatively small in size, now measuring only about 5 cm, and once this was taken down and freed, a large defect was identified below the level of the umbilicus. The edges of this fascial defect were then marked on the skin level and allowing for an adequate margin, a 15 x 15 cm Physiomesh was selected. This was then placed inside the peritoneal cavity and placed up against the abdominal wall with the center placed over the hernial defect. Following this, the secure strap was used to tack it down all around at 1 cm intervals and subsequent placement of four 0 Prolene stitches superior, inferior and 2 lateral. This was done through tiny stab incisions.  After this was secured and satisfactory reinforced repair was noted, the pneumoperitoneum was released. The ports were removed. The fascial opening at the left upper quadrant site closed with 0 Vicryl and the skin incisions closed with subcuticular 4-0 Vicryl in the port sites and all the skin openings closed with Dermabond. The procedure was well tolerated. She was subsequently extubated and returned to the recovery room in stable condition.   ____________________________ S.Robinette Haines, MD sgs:gb D: 11/25/2012 10:21:56 ET T: 11/25/2012 21:25:28 ET JOB#: [id number]  cc: Synthia Innocent. Jamal Collin, MD, <Dictator> [facility] Robinette Haines MD ELECTRONICALLY SIGNED 11/26/2012 6:35

## 2014-08-29 NOTE — Consult Note (Signed)
PATIENT NAME:  Susan [name], Susan [name] MR#:  [id number] DATE OF BIRTH:  Apr 23, 1961  DATE OF CONSULTATION:  11/24/2012  REFERRING PHYSICIAN:  S.G. Jamal Collin, MD CONSULTING PHYSICIAN:  Belia Heman. Verdell Carmine, MD  PRIMARY CARE PHYSICIAN: [facility].   CHIEF COMPLAINT: Hypoxia.   HISTORY OF PRESENT ILLNESS: This is a 54 year old female who is postoperative day #1 from ventral hernia repair. Was noted to be hypoxic with O2 saturation on 2 liters nasal cannula at low 90s. Hospitalist services were contacted for further treatment and evaluation. The patient does say that she has been having some exertional dyspnea progressively getting worse over the past few weeks. She denies any cough or productive sputum, any fevers, chills, any nausea, vomiting, chest pain or any other associated symptoms.   REVIEW OF SYSTEMS:  CONSTITUTIONAL: No documented fever. No weight gain, no weight loss.  EYES: No blurred or double vision.  EARS, NOSE, THROAT: No tinnitus. No postnasal drip. No redness of oropharynx.  RESPIRATORY: No cough, no wheeze, no hemoptysis. Positive dyspnea.  CARDIOVASCULAR: No chest pain, no orthopnea, no palpitations, no syncope.  GASTROINTESTINAL: No nausea. No vomiting. No diarrhea. Positive abdominal pain. No melena or hematochezia.  GENITOURINARY: No dysuria or hematuria.  ENDOCRINE: No polyuria or nocturia. No heat or intolerance.  HEMATOLOGIC: No anemia, no bruising, no bleeding.  INTEGUMENTARY: No rashes. No lesions.  MUSCULOSKELETAL: No arthritis, no swelling, no gout.  NEUROLOGIC: No numbness or tingling. No ataxia. No seizure-type  activity.  PSYCHIATRIC: Positive anxiety, positive depression. No ADD.   PAST MEDICAL HISTORY: Consistent with:  1. Hypertension. 2. GERD.  3. Anxiety/depression.   ALLERGIES: CODEINE, HYDROCHLOROTHIAZIDE, LASIX, MORPHINE.   SOCIAL HISTORY: She does smoke about a pack per day, has been smoking for the past 30+ years. 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 No other illicit drug abuse. Lives at home by herself.   FAMILY HISTORY: Mother is alive, has COPD and asthma. Father died from complications of alcohol abuse.   CURRENT MEDICATIONS:  1. Tylenol with oxycodone 5/325 one tab q.6 hours as needed.  2. Amlodipine 5 mg daily. 3. Bystolic 10 mg daily. 4. Cymbalta 30 mg b.i.d. 5. Colace 50 mg b.i.d. as needed.  6. Gabapentin 600 mg t.i.d. 7. Klonopin 0.5 mg b.i.d. 8. Lisinopril 20 mg b.i.d. 9. Loratadine 10 mg daily. 10. MiraLax as needed. 11. Omeprazole 20 mg daily.  12. Albuterol inhaler 2 puffs q.4 hours as needed.   PHYSICAL EXAMINATION: Presently, is as follows:  VITAL SIGNS: Temperature 97.6, pulse 66, respirations 18, blood pressure 131/80. Saturation is 93% on 2 liters nasal cannula. GENERAL: She is a pleasant-appearing female in no apparent distress.  HEAD, EYES, EARS, NOSE AND THROAT: The patient is atraumatic, normocephalic. Extraocular muscles are intact. Pupils are equal and reactive to light. Sclerae anicteric. No conjunctival injection. No pharyngeal erythema.  NECK: Supple. There is no jugular venous distention, no bruits, no lymphadenopathy, no thyromegaly.  HEART: Regular rate and rhythm. No murmurs, no rubs, no clicks.  LUNGS: Clear to auscultation bilaterally. No rales, no rhonchi, no wheezes.  ABDOMEN: Soft, flat, nontender, nondistended. Has good bowel sounds. No hepatosplenomegaly appreciated.  EXTREMITIES: No evidence of any cyanosis, clubbing or peripheral edema. Has +2 pedal and radial pulses bilaterally.  NEUROLOGICAL: The patient is alert, awake and oriented x3 with no focal motor or sensory deficits appreciated bilaterally.  SKIN: Moist and warm with no rash appreciated.  LYMPHATIC: There is no cervical or axillary lymphadenopathy.   LABORATORY  DATA: From July 15th, glucose of 87, BUN 7, creatinine 0.6, sodium 136, potassium 3.8, chloride 103, bicarbonate 26. White cell count 6, hemoglobin 14.2,  hematocrit 40.9, platelet count of 198.   ASSESSMENT AND PLAN: This is a 54 year old female with a history of hypertension, gastroesophageal reflux disease, anxiety/depression and neuropathy, who is status post ventral hernia repair postoperative day #, now is slightly hypoxic.   1. Hypoxia. The likely cause of this is the patient's underlying chronic obstructive pulmonary disease due to her long history of tobacco abuse combined with the patient taking shallow breaths due to her abdominal pain from recent surgery. I will place the patient on scheduled Symbicort, start some p.r.n. DuoNebs and give her incentive spirometry. She does not appear to have a chronic obstructive pulmonary disease exacerbation; therefore, hold off steroids and antibiotics for now. We should likely ambulate her and assess her O2 saturation in the morning.  2. Gastroesophageal reflux disease. Continue Prilosec.  3. Hypertension. Continue Norvasc, Bystolic and lisinopril.  4. Depression/anxiety. Continue Klonopin. Continue Cymbalta. 5. Status post ventral hernia repair. Continue pain control and care as per surgery.   Thank you so much for the consultation. Will follow along with you.   TIME SPENT WITH THE CONSULT: 45 minutes.   ____________________________ Belia Heman. Verdell Carmine, MD vjs:OSi D: 11/24/2012 11:17:46 ET T: 11/24/2012 11:29:42 ET JOB#: [id number]  cc: Belia Heman. Verdell Carmine, MD, <Dictator> Henreitta Leber MD ELECTRONICALLY SIGNED 11/26/2012 14:48

## 2014-08-31 NOTE — Op Note (Signed)
PATIENT NAME:  Susan [name], Susan [name] MR#:  [id number] DATE OF BIRTH:  1960/06/20  DATE OF PROCEDURE:  11/15/2011  PREOPERATIVE DIAGNOSIS: Bilateral ovarian cysts.  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   PROCEDURES:  1. Diagnostic laparoscopy.  2. Bilateral salpingo-oophorectomy.  3. Bilateral ovarian cystectomy.   SURGEON: Prentice Docker, M.D.   ASSISTANT: Malachy Mood, MD   ESTIMATED BLOOD LOSS: 25 mL.  OPERATIVE FLUIDS: 1500 mL crystalloid.   COMPLICATIONS: None.   FINDINGS:  1. Enlarged left ovary with multiple cysts and a mucinous/gelatinous appearing cyst adjacent to the left ovary.  2. Enlarged right ovary with multiple cystic appearing lesions on the surface and throughout.  3. Normal appearing uterus with several serosal gelatinous/mucinous lesions adherent.  4. Normal appearing appendix.  5. Otherwise normal abdominal survey.   SPECIMENS:  1. Right tube and ovary and ovarian cyst. 2. Left tube and ovary and ovarian cyst.  3. Uterine serosal biopsy. 4. Pelvic washings.   CONDITION: Stable at end of procedure.  INDICATION FOR PROCEDURE: Susan [name] is a 54 year old gravida 2, para 2-0-0-3 female who presented to me in clinic with history of abdominal pain for which she had previously presented to the emergency room at [facility]. During her work-up, it was noted that she had bilateral ovarian cysts on CT scan. In addition she had diverticulitis. The patient's medical history is also complicated by hypertension as well as hepatitis C. The patient has been seen and evaluated by GYN oncologist, Veryl Speak, MD, here at [facility] with recommendation for removal of tubes and ovaries and the preliminary report dictating the way the rest of the surgery is performed. The patient strongly desired to proceed with having a definitive diagnosis, which was recommended, and she was  therefore taken to the operating room.  PROCEDURE IN DETAIL: After the patient was met in the preoperative area and the details of the procedure were reviewed with her and all questions were answered with her and her family, she was taken to the operating room. She was placed under general anesthesia, which was found to be adequate, and she was placed in the dorsal supine lithotomy position and prepped and draped in the usual sterile fashion. After a time out was performed, a Foley catheter was inserted through the urethra into the bladder and a sterile speculum was placed in the vagina and the cervix was visualized and a single-tooth tenaculum was used to grasp the anterior lip of the cervix. An acorn uterine manipulator was then affixed to the single-tooth tenaculum. The speculum was then removed.   Attention was turned to the abdomen where a 5 mm infraumbilical incision was made after injection of 0.5% Marcaine. The abdomen was then entered using direct visualization with the Optiview trocar method. Entrance into the abdomen was verified based on the opening pressure once CO2 was insufflated. The CO2 was then used to obtain a pneumoperitoneum. The scope was inserted through the trocar and atraumatic entry was verified. Right and left lower quadrant ports were placed. Both were 5 mm. Both were placed under direct visualization from the  intraabdominal camera without difficulty. The abdomen was then inspected with the above-noted findings. Using the suction irrigator, the pelvis was irrigated and the irrigant was suctioned out using a separate tube and separate syringe for pelvic washings. Attention was turned to the right fallopian tube and ovary and the surrounding anatomy. The ureter was visualized on the right side and was found to be  well away from the IP ligament which was subsequently transected and then the mesosalpinx was transected along the length until the entire tube and ovary were then separated  from its pedicle. The same procedure was performed on the left tube and ovary where the left ureter was visualized and found to be well away from the IP ligament which was then transected using electrocautery and the mesosalpinx was then transected using the LigaSure medially until the entire tube and ovary along with the cyst were removed. The infraumbilical port was extended to be 10 mm and a 10 mm port was placed. The Endo Catch bag was introduced through the umbilical port and the left tube and ovary was placed in the Endo Catch bag which was removed without difficulty. The right tube and ovary was then placed in a separate new Endo Catch bag and in order to have it be removed without spilling any contents of the ovary, the fascia and skin had to be extended for approximately another 5 to 7 mm. Once specimens were removed, the preliminary report for both specimens returned from pathology as serous cystadenoma. At that point, the procedure was terminated with the benign findings. A fascial closure device was then introduced through the infraumbilical incision and the fascial defect was repaired using two separate interrupted stitches of #0 Vicryl with hemoperitoneum maintained after closure of the fascia. After this the abdomen was desufflated of CO2. Under direct visualization both 5 mm ports were removed. Each port site was closed in a subcuticular fashion with 4-0 Monocryl and the skin was reapproximated using Dermabond. A total of 30 mL of 0.5% Marcaine was injected at the incision sites for postoperative pain.   The uterine manipulator and single-tooth tenaculum were removed from the cervix under direct visualization after sterile speculum was placed. There was hemostasis. Then all instruments were removed and the vagina was verified to be free of any sponges or instruments. The catheter was then removed as well in the OR.   The patient tolerated the procedure well. Sponge, lap, and needle counts were  correct x2. For antibiotic prophylaxis, the patient received 2 grams of Ancef prior to skin incision. For VTE prophylaxis, the patient received heparin 5000 units subcutaneously prior to the surgery. She also had on pneumatic compression stockings which were in place and operating throughout the entire procedure. She was awakened in the operating room and taken to the recovery area in stable condition.  ____________________________ Will Bonnet, MD sdj:slb D: 11/15/2011 12:05:25 ET T: 11/15/2011 12:33:15 ET JOB#: [id number]  cc: Will Bonnet, MD, <Dictator> Will Bonnet MD ELECTRONICALLY SIGNED 11/15/2011 16:17

## 2014-09-03 DIAGNOSIS — F4310 Post-traumatic stress disorder, unspecified: Secondary | ICD-10-CM | POA: Diagnosis not present

## 2014-09-16 ENCOUNTER — Encounter: Payer: Self-pay | Admitting: Dietician

## 2014-09-16 NOTE — Progress Notes (Signed)
Mailed discharge letter from [facility] program to Dr. Enid Derry.

## 2014-09-25 DIAGNOSIS — F4310 Post-traumatic stress disorder, unspecified: Secondary | ICD-10-CM | POA: Diagnosis not present

## 2014-09-29 ENCOUNTER — Ambulatory Visit: Payer: Medicare Other | Attending: Obstetrics and Gynecology | Admitting: Physical Therapy

## 2014-09-29 ENCOUNTER — Encounter: Payer: Self-pay | Admitting: Physical Therapy

## 2014-09-29 VITALS — BP 120/80

## 2014-09-29 DIAGNOSIS — R279 Unspecified lack of coordination: Secondary | ICD-10-CM | POA: Insufficient documentation

## 2014-09-29 DIAGNOSIS — R2689 Other abnormalities of gait and mobility: Secondary | ICD-10-CM | POA: Insufficient documentation

## 2014-09-29 DIAGNOSIS — X58XXXS Exposure to other specified factors, sequela: Secondary | ICD-10-CM | POA: Diagnosis not present

## 2014-09-29 DIAGNOSIS — S39001S Unspecified injury of muscle, fascia and tendon of abdomen, sequela: Secondary | ICD-10-CM | POA: Insufficient documentation

## 2014-09-29 DIAGNOSIS — N393 Stress incontinence (female) (male): Secondary | ICD-10-CM | POA: Insufficient documentation

## 2014-09-30 DIAGNOSIS — M79641 Pain in right hand: Secondary | ICD-10-CM | POA: Diagnosis not present

## 2014-09-30 DIAGNOSIS — M5416 Radiculopathy, lumbar region: Secondary | ICD-10-CM | POA: Diagnosis not present

## 2014-09-30 DIAGNOSIS — I10 Essential (primary) hypertension: Secondary | ICD-10-CM | POA: Diagnosis not present

## 2014-09-30 DIAGNOSIS — E0590 Thyrotoxicosis, unspecified without thyrotoxic crisis or storm: Secondary | ICD-10-CM | POA: Diagnosis not present

## 2014-09-30 DIAGNOSIS — E559 Vitamin D deficiency, unspecified: Secondary | ICD-10-CM | POA: Diagnosis not present

## 2014-09-30 DIAGNOSIS — M5136 Other intervertebral disc degeneration, lumbar region: Secondary | ICD-10-CM | POA: Diagnosis not present

## 2014-09-30 DIAGNOSIS — F17290 Nicotine dependence, other tobacco product, uncomplicated: Secondary | ICD-10-CM | POA: Diagnosis not present

## 2014-09-30 NOTE — Patient Instructions (Addendum)
    Heavily educated the importance of CPAP compliance for cardiac health, sleep quality. PT spoke with Dr. Delight Ovens RN, informing MD re: poor compliance with CPAP use and recommended updated sleep study as improved sleep quality may help enhance her prognosis for nocturia/ nighttime urgency.   Recommended decreased fluid intake before bedtime, decreased cigarettes and tea daily.   Guided diaphragmatic breathing and educated on proper toileting technique with feet propped and refrain from abdominal straining.

## 2014-10-01 DIAGNOSIS — F4310 Post-traumatic stress disorder, unspecified: Secondary | ICD-10-CM | POA: Diagnosis not present

## 2014-10-01 NOTE — Therapy (Signed)
[name] SERVICES [address] [place], [place], [address] Phone: [phone]   Fax:  [phone]  Physical Therapy Evaluation  Patient Details  Name: Susan [name] MRN: [id number] Date of Birth: 1960-06-18 Referring Provider:  Roda Shutters, FNP  Encounter Date: 09/29/2014      PT End of Session - 09/29/14 1629    Visit Number 1   Number of Visits 12   Date for PT Re-Evaluation 12/08/14   PT Start Time 1508   PT Stop Time 2458   PT Time Calculation (min) 57 min   Activity Tolerance Patient tolerated treatment well;No increased pain   Behavior During Therapy [facility] for tasks assessed/performed      Past Medical History  Diagnosis Date  . Thyroid disease     hyperthyroid  . 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 Hepatitis C   . Depression   . Anxiety   . Bipolar 1 disorder   . History of alcohol abuse   . History of hepatitis C   . Hypertension   . History of MRSA infection 2013  . History of diverticulitis 2013  . H/O suicide attempt     slit wrists  . Osteoporosis   . Arthritis     rheumatoid arthritis  . Sleep apnea     Pt discontinue use of CPAP last year, Dx 3 years ago.     Past Surgical History  Procedure Laterality Date  . Salpingoophorectomy Bilateral 11-13-2011  . Breast surgery Left 20 yrs ago  . Hernia repair  06/2011, July 2014  . Cesarean section      Filed Vitals:   09/29/14 1616  BP: 120/80    Visit Diagnosis:  Lack of coordination - Plan: PT plan of care cert/re-cert  Decreased mobility - Plan: PT plan of care cert/re-cert  Unspecified injury of muscle, fascia and tendon of abdomen, sequela - Plan: PT plan of care cert/re-cert          [name] PT Assessment - 09/29/14 1659    Assessment   Medical Diagnosis urinary frequency    Precautions   Precautions None   Restrictions   Weight Bearing Restrictions No   Balance Screen   Has the patient fallen in the past 6 months No   Prior  Function   Level of Independence Independent   Observation/Other Assessments   Observations increased abdominal adipose, chest breathing  SOB after walking from waiting room to treatment room   Other Surveys  --  Urinary Distress Inventory 100% (lower % = worst dysfunction   Functional Tests   Functional tests Other  abdominal straining w/ cue for bowel movement   Posture/Postural Control   Posture/Postural Control Postural limitations   Posture Comments increased lumbopelvis instability with ASLR, able to demo increased stability w/ cuing to coordinate exhalation   ROM / Strength   AROM / PROM / Strength AROM  spinal mobility limited in flexion,ext,sideflexion,rotat   AROM   Overall AROM  Deficits;Other (comment)  limited segmental motion   Palpation   Palpation comment decreased scar mobility along abdomen, decreased softness to upper and LQ   Pt declined internal assessment.                           PT Education - 09/30/14 2343    Education provided Yes   Education Details HEP   Person(s) Educated Patient   Comprehension Verbalized understanding;Verbal cues required;Tactile cues  required;Returned demonstration             PT Long Term Goals - 09/30/14 2356    PT LONG TERM GOAL #1   Title Pt will decrease her UDI score from 100% to < 80% in order to show improved urogenital function and improve QOL.   Time 12   Period Weeks   Status New   PT LONG TERM GOAL #2   Title Pt will be compliant with CPAP use to improve quality of sleep and decrease urgency to urinate at night from 8-10x to < 6 x.    Time 12   Period Weeks   Status New   PT LONG TERM GOAL #3   Title Pt will demo proper diaphragmatic breathing and pelvic flor coordination for bowel momvent, OOB technique, and lifting in order restore pelvic floor function.    Time 12   Period Weeks   Status New   PT LONG TERM GOAL #4   Title Pt will be compliant with relaxation techniques to improve  sleep/urinary urgency.   Time 12   Period Weeks   Status New   PT LONG TERM GOAL #5   Title Pt will be compliant with decreasing fluid intake (1/2 gallon of tea/day to < 4 cups/day) and decreasing fluid intake 3 hrs prior to bed.    Time 12   Status New               Plan - 09/30/14 2346    Clinical Impression Statement Pt is a 54 yo who c/o nocturia and nighttime urinary urgency.    Pt will benefit from skilled therapeutic intervention in order to improve on the following deficits Abnormal gait;Cardiopulmonary status limiting activity;Decreased mobility;Decreased scar mobility;Increased fascial restricitons;Hypomobility;Postural dysfunction;Improper body mechanics;Decreased activity tolerance;Obesity;Other (comment)  lack of education on bladder health    Rehab Potential Good ( motivated )   Clinical Impairments Affecting Rehab Potential Prognosis is limited by pt's poor compliance to CPAP use for OSA, alcohol and nicotine use, Hx of abdominal surgeries, and co-morbidities. Pt demo'd poor breathing mechanics, increased loading on pelvic floor with poor body mechanics and poor posture. Pt also demo'd abdominal scars with poor mobility. Pt declined internal assessment/ doffing of clothes. Pt's PCP has been informed of pt's poor compliance to use of CPAP.    PT Frequency 1x / week   PT Duration 12 weeks   PT Treatment/Interventions ADLs/Self Care Home Management;Electrical Stimulation;Biofeedback;Moist Heat;Stair training;Functional mobility training;Therapeutic activities;Therapeutic exercise;Balance training;Dry needling;Manual techniques;Neuromuscular re-education;Patient/family education;Energy conservation;Cryotherapy;Scar mobilization   Consulted and Agree with Plan of Care Patient          G-Codes - 10/13/2014 0007    Functional Assessment Tool Used UDI-6   Functional Limitation Self care   Self Care Current Status [phone]) At least 80 percent but less than 100 percent impaired,  limited or restricted   Self Care Goal Status (U0454) At least 60 percent but less than 80 percent impaired, limited or restricted       Problem List Patient Active Problem List   Diagnosis Date Noted  . Subclinical hyperthyroidism 06/26/2014  . Toxic multinodular goiter 06/26/2014  . Hypertension 06/26/2014  . Hepatitis C 06/26/2014  . Tobacco dependence 06/26/2014  . Bipolar disorder 06/26/2014  . Recurrent ventral hernia 11/08/2012    Jerl Mina ,PT, DPT, E-RYT  Oct 13, 2014, 12:14 AM  [employer] MAIN [name] SERVICES [address] [place], [place], [address] Phone: [phone]   Fax:  [phone]

## 2014-10-02 ENCOUNTER — Ambulatory Visit: Payer: Medicare Other | Admitting: Physical Therapy

## 2014-10-02 VITALS — BP 138/75

## 2014-10-02 DIAGNOSIS — S39001S Unspecified injury of muscle, fascia and tendon of abdomen, sequela: Secondary | ICD-10-CM | POA: Diagnosis not present

## 2014-10-02 DIAGNOSIS — R279 Unspecified lack of coordination: Secondary | ICD-10-CM | POA: Diagnosis not present

## 2014-10-02 DIAGNOSIS — R2689 Other abnormalities of gait and mobility: Secondary | ICD-10-CM

## 2014-10-02 DIAGNOSIS — N393 Stress incontinence (female) (male): Secondary | ICD-10-CM | POA: Diagnosis not present

## 2014-10-02 NOTE — Therapy (Signed)
[name] [facility] SERVICES [address] [place], [place], [address] Phone: [phone]   Fax:  [phone]  Physical Therapy Treatment  Patient Details  Name: Susan [name] MRN: [id number] Date of Birth: 10/10/1960 Referring Provider:  Roda Shutters, FNP  Encounter Date: 10/02/2014      PT End of Session - 10/02/14 1544    Visit Number 2   Number of Visits 12   Date for PT Re-Evaluation 12/08/14   PT Start Time 4967   PT Stop Time 1450   PT Time Calculation (min) 47 min   Activity Tolerance Patient tolerated treatment well;No increased pain   Behavior During Therapy [facility] for tasks assessed/performed      Past Medical History  Diagnosis Date  . Thyroid disease     hyperthyroid  . 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 Hepatitis C   . Depression   . Anxiety   . Bipolar 1 disorder   . History of alcohol abuse   . History of hepatitis C   . Hypertension   . History of MRSA infection 2013  . History of diverticulitis 2013  . H/O suicide attempt     slit wrists  . Osteoporosis   . Arthritis     rheumatoid arthritis  . Sleep apnea     Pt discontinue use of CPAP last year, Dx 3 years ago.   . OSA (obstructive sleep apnea)     managed by Dr. Manuella Ghazi  . Hypothyroidism   . Multinodular thyroid   . Vitamin D deficiency disease   . Vitamin B12 deficiency     Past Surgical History  Procedure Laterality Date  . Salpingoophorectomy Bilateral 11-13-2011  . Breast surgery Left 20 yrs ago  . Cesarean section    . Hernia repair  06/2011, July 2014    Ventral wall repair with Physiomesh  . Bilateral salpingoophorectomy  July 2013    due to abnormal mass    Filed Vitals:   10/02/14 1544  BP: 138/75    Visit Diagnosis:  Lack of coordination  Decreased mobility  Unspecified injury of muscle, fascia and tendon of abdomen, sequela      Subjective Assessment - 10/02/14 1422    Subjective Pt reported she slept better last night after  taking atenol (prescribed by PCP). Pt was told by her PCP's staff that she would need to f/u Dr. Manuella Ghazi (neurologist) for the sleep study referral. Pt found that the breathing technique on the toilet helpde with bowel movement and complete voiding. Pt stopped drinking fluids 3 hours before bed, and tired decreasing her tea.    Pertinent History Hx of alcohol use but started to decrease beers from 6-8/day to 2-3/day and current smoker 1 pack a day.  Hx of 1 vaginal surgery (with tears/stitches), 1 C-section, umbilical hernia repair. Currently with diverticulitis and  monitoring diet. Unable to leave the house 1x/week due to frequent Type 6 (Bristol Stool Scale) bowel momvents    Currently in Pain? Yes   Pain Score 6    Pain Location Back   Pain Orientation Medial   Pain Descriptors / Indicators Spasm   Pain Type --  post delivery (post partum)    Pain Onset Other (comment)  54 yo ago   Pain Frequency Constant   Aggravating Factors  moving, bending, baking, standing    Pain Relieving Factors average pain 5/10, pt will take Vicadin when pain 8/10  [facility] Adult PT Treatment/Exercise - 10/02/14 1446    Bed Mobility   Bed Mobility Supine to Sit   Supine to Sit --  log rolling 2 reps   Posture/Postural Control   Posture/Postural Control Postural limitations   Posture Comments provided cues for proper sitting, Pt able to maintain upright posture for ~3 min x2 during subjective taking and end of session   Exercises   Exercises Other Exercises  open book 15 reps bil   Manual Therapy   Manual Therapy Soft tissue mobilization;Joint mobilization   Manual therapy comments noted significant tensions along paraspinals and hypomobility along t-spine   Joint Mobilization Grade II along T1-15 PA on SP    Soft tissue mobilization parapsinal STM and MWM w/ open book 5 reps                PT Education - 10/02/14 1541    Education Details HEP and reinforced  small changes in behavorial changes. PT left a message with Dr. Trena Platt RN re: pt's recent spikes in BP, poor compilance with CPAP, and prognosis impacted by poorly managed OSA   Person(s) Educated Patient             PT Long Term Goals - 10/02/14 1433    PT LONG TERM GOAL #1   Title Pt will decrease her UDI score from 100% to < 80% in order to show improved urogenital function and improve QOL.   Time 12   Period Weeks   Status New   PT LONG TERM GOAL #2   Title Pt will be compliant with CPAP use to improve quality of sleep and decrease urgency to urinate at night from 8-10x to < 6 x.    Time 12   Period Weeks   Status New   PT LONG TERM GOAL #3   Title Pt will demo proper diaphragmatic breathing and pelvic flor coordination for bowel momvent, OOB technique, and lifting in order restore pelvic floor function.    Time 12   Period Weeks   Status New   PT LONG TERM GOAL #4   Title Pt will be compliant with relaxation techniques to improve sleep/urinary urgency.   Time 12   Period Weeks   Status New   PT LONG TERM GOAL #5   Title Pt will be compliant with decreasing fluid intake (1/2 gallon of tea/day to < 4 cups/day) and decreasing fluid intake 3 hrs prior to bed.    Time 12   Status New   Additional Long Term Goals   Additional Long Term Goals Yes   PT LONG TERM GOAL #6   Title Pt will report no bowel episode that limits her from leaving her house for 1 week in order to increase participatin in the community.    Time 12   Period Weeks   Status New               Plan - 10/02/14 1545    Clinical Impression Statement Pt shows good compliance to toileting posture and breathing practice from first session, Pt responded well to postural education, manual Tx, and progressed to diaphragmatic breathing in seated position. Pt will continue to benefit from skilled PT to advance towards her goals. Anticipate pt will continue to progress because pt is motivated to make behavorial  changes despite current medical conditions. Pt's medical providers (PCP and neurologist) have been contacted to f/u on her poor compliance of CPAP and recent spike in BP. Pt's BP today  was 138/75.      Pt will benefit from skilled therapeutic intervention in order to improve on the following deficits Abnormal gait;Cardiopulmonary status limiting activity;Decreased mobility;Decreased scar mobility;Increased fascial restrictions;Hypomobility;Postural dysfunction;Improper body mechanics;Decreased activity tolerance;Obesity;Other (comment)  lack of education on bladder health    Rehab Potential Good   Clinical Impairments Affecting Rehab Potential Prognosis is limited by pt's poor compliance to CPAP use for OSA, alcohol and nicotine use, Hx of abdominal surgeries, and co-morbidities. Pt demo'd poor breathing mechanics, increased loading on pelvic floor with poor body mechanics and poor posture. Pt also demo'd abdominal scars with poor mobility. Pt declined internal assessment/ doffing of clothes.     PT Frequency 1x / week   PT Duration 12 weeks   PT Treatment/Interventions ADLs/Self Care Home Management;Electrical Stimulation;Biofeedback;Moist Heat;Stair training;Functional mobility training;Therapeutic activities;Therapeutic exercise;Balance training;Dry needling;Manual techniques;Neuromuscular re-education;Patient/family education;Energy conservation;Cryotherapy;Scar mobilization   PT Next Visit Plan reassess thorax, initiate abdominal massage    Consulted and Agree with Plan of Care Patient        Problem List Patient Active Problem List   Diagnosis Date Noted  . Subclinical hyperthyroidism 06/26/2014  . Toxic multinodular goiter 06/26/2014  . Hypertension 06/26/2014  . Hepatitis C 06/26/2014  . Tobacco dependence 06/26/2014  . Bipolar disorder 06/26/2014  . Recurrent ventral hernia 11/08/2012    Jerl Mina ,PT, DPT, E-RYT  10/02/2014, 3:51 PM  [place] MAIN [facility] SERVICES [address] [place], [place], [address] Phone: [phone]   Fax:  [phone]

## 2014-10-02 NOTE — Patient Instructions (Signed)
Open book handout  1) log roll out bed 2) proper sitting posture 3) diaphragmatic breathing seated 4) vacuuming with sidestep not forward bending

## 2014-10-08 DIAGNOSIS — F4310 Post-traumatic stress disorder, unspecified: Secondary | ICD-10-CM | POA: Diagnosis not present

## 2014-10-09 ENCOUNTER — Ambulatory Visit: Payer: Medicare Other | Attending: Obstetrics and Gynecology | Admitting: Physical Therapy

## 2014-10-09 VITALS — BP 121/61

## 2014-10-09 DIAGNOSIS — Z7409 Other reduced mobility: Secondary | ICD-10-CM | POA: Diagnosis not present

## 2014-10-09 DIAGNOSIS — R279 Unspecified lack of coordination: Secondary | ICD-10-CM | POA: Diagnosis not present

## 2014-10-09 DIAGNOSIS — R350 Frequency of micturition: Secondary | ICD-10-CM | POA: Insufficient documentation

## 2014-10-09 DIAGNOSIS — S39001S Unspecified injury of muscle, fascia and tendon of abdomen, sequela: Secondary | ICD-10-CM | POA: Diagnosis not present

## 2014-10-09 DIAGNOSIS — X58XXXS Exposure to other specified factors, sequela: Secondary | ICD-10-CM | POA: Insufficient documentation

## 2014-10-09 DIAGNOSIS — R2689 Other abnormalities of gait and mobility: Secondary | ICD-10-CM

## 2014-10-09 NOTE — Patient Instructions (Addendum)
This week work on for one 1/4 gallon (1 L) of decaffeinated tea, drink 1 L-1.5 L of water/ day. ( have tea in the morning and then water for the rest of the day).  This week tea: water ratio is 1:1, by the end of week 1 tea: 2 water. By end of the month: tea would be only for treats 1-2x/ week, 8 (8 fl oz) of water per day.    Monitor your blood pressure. Follow up with Dr. Trena Platt office for sleep study referral.  [phone]  Keep up the good work!

## 2014-10-10 NOTE — Therapy (Signed)
[place] MAIN [facility] SERVICES [address] [place], [place], [address] Phone: [phone]   Fax:  [phone]  Physical Therapy Treatment  Patient Details  Name: Susan [name] MRN: [id number] Date of Birth: [birth year] Referring Provider:  Roda Shutters, FNP  Encounter Date: 10/09/2014      PT End of Session - 10/10/14 2127    Visit Number 3   Number of Visits 12   Date for PT Re-Evaluation 12/08/14   Authorization Type G code 10/10 visit  --3/10   PT Start Time 6962   PT Stop Time 1500   PT Time Calculation (min) 53 min   Activity Tolerance Patient tolerated treatment well;No increased pain   Behavior During Therapy [facility] for tasks assessed/performed      Past Medical History  Diagnosis Date  . Thyroid disease     hyperthyroid  . 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 Hepatitis C   . Depression   . Anxiety   . Bipolar 1 disorder   . History of alcohol abuse   . History of hepatitis C   . Hypertension   . History of MRSA infection 2013  . History of diverticulitis 2013  . H/O suicide attempt     slit wrists  . Osteoporosis   . Arthritis     rheumatoid arthritis  . Sleep apnea     Pt discontinue use of CPAP last year, Dx 3 years ago.   . OSA (obstructive sleep apnea)     managed by Dr. Manuella Ghazi  . Hypothyroidism   . Multinodular thyroid   . Vitamin D deficiency disease   . Vitamin B12 deficiency     Past Surgical History  Procedure Laterality Date  . Salpingoophorectomy Bilateral 11-13-2011  . Breast surgery Left 20 yrs ago  . Cesarean section    . Hernia repair  06/2011, July 2014    Ventral wall repair with Physiomesh  . Bilateral salpingoophorectomy  July 2013    due to abnormal mass    Filed Vitals:   10/09/14 1425  BP: 121/61    Visit Diagnosis:  Lack of coordination  Decreased mobility  Unspecified injury of muscle, fascia and tendon of abdomen, sequela      Subjective Assessment - 10/09/14 1424     Subjective Pt was able to sleep for 5 hrs last night after decreasing her bedroom temperature and went to bathroom 2x/night  instead 8x/night. The past 3-4 days, pt has not had leakage, and has had regular bowel movements. She has decreased cigarettes by 20 to 16 /day. Pt decreased her tea intake from 1/2 gallon 1/3 of a gallon. Pt continues to only drink 16-20 fl oz water/day.  Yesterday her blood pressure was 16/100 yesterday and decreased to 173/99. Pt expressed she experienced major stress w/ family related issues. Today she experiences a HA with blurred vision, both Sx started this morning. Pt experiences back pain today after lifting her 60# dog into bed.    Pertinent History Hx of alcohol use but started to decrease beers from 6-8/day to 2-3/day and current smoker 1 pack a day.  Hx of 1 vaginal surgery (with tears/stitches), 1 C-section, umbilical hernia repair. Currently with diverticulitis and  monitoring diet. Unable to leave the house 1x/week due to frequent Type 6 (Bristol Stool Scale) bowel momvents    Currently in Pain? Yes   Pain Score 5    Pain Location Back   Pain Orientation Medial   Pain Onset  Other (comment)  54 yo ago            [facility] PT Assessment - 10/10/14 0001    Posture/Postural Control   Posture Comments --  Dynamic Stabilization Level 1-2                     [facility] Adult PT Treatment/Exercise - 10/09/14 1452    Posture/Postural Control   Posture Comments --  Dynamic Stabilization Level 1-2   Exercises   Exercises Knee/Hip  Figure 4 and cross over stretches (5 breaths) 2x/day                PT Education - 10/10/14 2123    Education provided Yes   Education Details HEP, reviewed S & Sx of stroke, educated the impact of caffeine and nicotine on BP, encouraged pt to f/u with Dr Trena Platt clinic for sleep study, adviced pt to gradually return to walking only short bouts when she returns to the gym and to refrain from immediate return to her  typical 3 mi  due to the recent fluctuations in her BP, educated her about the monitored cardiac fitness program at [facility] but pt declined and preferred to return to gym.   Person(s) Educated Patient   Methods Explanation;Demonstration   Comprehension Verbalized understanding;Returned demonstration             PT Long Term Goals - 10/02/14 1433    PT LONG TERM GOAL #1   Title Pt will decrease her UDI score from 100% to < 80% in order to show improved urogenital function and improve QOL.   Time 12   Period Weeks   Status New   PT LONG TERM GOAL #2   Title Pt will be compliant with CPAP use to improve quality of sleep and decrease urgency to urinate at night from 8-10x to < 6 x.    Time 12   Period Weeks   Status New   PT LONG TERM GOAL #3   Title Pt will demo proper diaphragmatic breathing and pelvic flor coordination for bowel momvent, OOB technique, and lifting in order restore pelvic floor function.    Time 12   Period Weeks   Status New   PT LONG TERM GOAL #4   Title Pt will be compliant with relaxation techniques to improve sleep/urinary urgency.   Time 12   Period Weeks   Status New   PT LONG TERM GOAL #5   Title Pt will be compliant with decreasing fluid intake (1/2 gallon of tea/day to < 4 cups/day) and decreasing fluid intake 3 hrs prior to bed.    Time 12   Status New   Additional Long Term Goals   Additional Long Term Goals Yes   PT LONG TERM GOAL #6   Title Pt will report no bowel episode that limits her from leaving her house for 1 week in order to increase participatin in the community.    Time 12   Period Weeks   Status New               Plan - 10/09/14 1500    Clinical Impression Statement Pt responded well to dynamic stabilization 1-2 and piriformis stretches (figure 4 and cross over) with decreased LBP from 6/10 to 2/10 after Tx. Pt's BP was taken twice and within normal range. Pt denied LOB and showed no signs of slurred speech/ LOB and  cranial tests were neg. Reviewed signs of stroke with pt.  Dr. Trena Platt office was called again to f/u about sleep study . Pt was educated the importance of decreasing her tea intake and cigarettes due to increased impact on her spikes in BP and  encouraged the use of breathing exercises for stress-management. Pt was educated when she returns to her walking routine that she needs to resume with short bouts and rest rather than return to her typical 6mi of walking. Despite her medical complications, pt continues to show motivation and compliance. Pt continues to benefit from skilled PT.     Pt will benefit from skilled therapeutic intervention in order to improve on the following deficits Abnormal gait;Cardiopulmonary status limiting activity;Decreased mobility;Decreased scar mobility;Increased fascial restricitons;Hypomobility;Postural dysfunction;Improper body mechanics;Decreased activity tolerance;Obesity;Other (comment)  lack of education on bladder health    Rehab Potential Good   Clinical Impairments Affecting Rehab Potential Prognosis is limited by pt's poor compliance to CPAP use for OSA, alcohol and nicotine use, Hx of abdominal surgeries, and co-morbidities. Pt demo'd poor breathing mechanics, increased loading on pelvic floor with poor body mechanics and poor posture. Pt also demo'd abdominal scars with poor mobility. Pt declined internal assessment/ doffing of clothes.     PT Frequency 1x / week   PT Duration 12 weeks   PT Treatment/Interventions ADLs/Self Care Home Management;Electrical Stimulation;Biofeedback;Moist Heat;Stair training;Functional mobility training;Therapeutic activities;Therapeutic exercise;Balance training;Dry needling;Manual techniques;Neuromuscular re-education;Patient/family education;Energy conservation;Cryotherapy;Scar mobilization   PT Next Visit Plan reasses thorax, initiate abdominal massage    Consulted and Agree with Plan of Care Patient        Problem  List Patient Active Problem List   Diagnosis Date Noted  . Subclinical hyperthyroidism 06/26/2014  . Toxic multinodular goiter 06/26/2014  . Hypertension 06/26/2014  . Hepatitis C 06/26/2014  . Tobacco dependence 06/26/2014  . Bipolar disorder 06/26/2014  . Recurrent ventral hernia 11/08/2012    Jerl Mina ,PT, DPT, E-RYT  10/10/2014, 10:03 PM  [place] MAIN [facility] SERVICES [address] [place], [place], [address] Phone: [phone]   Fax:  [phone]

## 2014-10-13 DIAGNOSIS — D225 Melanocytic nevi of trunk: Secondary | ICD-10-CM | POA: Diagnosis not present

## 2014-10-13 DIAGNOSIS — L298 Other pruritus: Secondary | ICD-10-CM | POA: Diagnosis not present

## 2014-10-13 DIAGNOSIS — L820 Inflamed seborrheic keratosis: Secondary | ICD-10-CM | POA: Diagnosis not present

## 2014-10-13 DIAGNOSIS — L538 Other specified erythematous conditions: Secondary | ICD-10-CM | POA: Diagnosis not present

## 2014-10-15 DIAGNOSIS — F4310 Post-traumatic stress disorder, unspecified: Secondary | ICD-10-CM | POA: Diagnosis not present

## 2014-10-16 ENCOUNTER — Ambulatory Visit: Payer: Medicare Other | Admitting: Family Medicine

## 2014-10-16 ENCOUNTER — Ambulatory Visit: Payer: Medicare Other | Admitting: Physical Therapy

## 2014-10-16 VITALS — BP 134/68

## 2014-10-16 DIAGNOSIS — Z7409 Other reduced mobility: Secondary | ICD-10-CM | POA: Diagnosis not present

## 2014-10-16 DIAGNOSIS — R279 Unspecified lack of coordination: Secondary | ICD-10-CM

## 2014-10-16 DIAGNOSIS — R350 Frequency of micturition: Secondary | ICD-10-CM | POA: Diagnosis not present

## 2014-10-16 DIAGNOSIS — F4310 Post-traumatic stress disorder, unspecified: Secondary | ICD-10-CM | POA: Diagnosis not present

## 2014-10-16 DIAGNOSIS — S39001S Unspecified injury of muscle, fascia and tendon of abdomen, sequela: Secondary | ICD-10-CM

## 2014-10-16 DIAGNOSIS — R2689 Other abnormalities of gait and mobility: Secondary | ICD-10-CM

## 2014-10-16 NOTE — Therapy (Signed)
[place] MAIN [facility] SERVICES [address] [place], [place], [address] Phone: [phone]   Fax:  [phone]  Physical Therapy Treatment  Patient Details  Name: Susan [name] MRN: [id number] Date of Birth: 10/07/1960 Referring Provider:  Arnetha Courser, MD  Encounter Date: 10/16/2014    Past Medical History  Diagnosis Date  . Thyroid disease     hyperthyroid  . 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 Hepatitis C   . Depression   . Anxiety   . Bipolar 1 disorder   . History of alcohol abuse   . History of hepatitis C   . Hypertension   . History of MRSA infection 2013  . History of diverticulitis 2013  . H/O suicide attempt     slit wrists  . Osteoporosis   . Arthritis     rheumatoid arthritis  . Sleep apnea     Pt discontinue use of CPAP last year, Dx 3 years ago.   . OSA (obstructive sleep apnea)     managed by Dr. Manuella Ghazi  . Hypothyroidism   . Multinodular thyroid   . Vitamin D deficiency disease   . Vitamin B12 deficiency     Past Surgical History  Procedure Laterality Date  . Salpingoophorectomy Bilateral 11-13-2011  . Breast surgery Left 20 yrs ago  . Cesarean section    . Hernia repair  06/2011, July 2014    Ventral wall repair with Physiomesh  . Bilateral salpingoophorectomy  July 2013    due to abnormal mass    Filed Vitals:   10/16/14 1400  BP: 134/68    Visit Diagnosis:  Lack of coordination  Decreased mobility  Unspecified injury of muscle, fascia and tendon of abdomen, sequela      Subjective Assessment - 10/16/14 1430    Subjective Pt reported no leakage at night and day for one week. Pt reported nocturia 2-3x/day. Pt reported she has trouble falling asleep. Pt stated she decreased her smoking by 50%. Pt's stated her BP has been more stable the past week in 130s/70-80. Pt will be seeing Dr. Marzetta Board next Tues but has not heard back from Dr. Trena Platt office for sleep study appt. Pt 's LBP has decreased to 2/10 without  radiating pain for the past 3-4 days and is now localized more in R buttock. Pt  stated she is having regular bowel movements.    Pertinent History Hx of alcohol use but started to decrease beers from 6-8/day to 2-3/day and current smoker 1 pack a day.  Hx of 1 vaginal surgery (with tears/stitches), 1 C-section, umbilical hernia repair. Currently with diverticulitis and  monitoring diet. Unable to leave the house 1x/week due to frequent Type 6 (Bristol Stool Scale) bowel momvents    Currently in Pain? Yes   Pain Score 1    Pain Location Back   Pain Orientation Medial   Pain Descriptors / Indicators Spasm   Pain Radiating Towards none for 3-4 days   Pain Onset Other (comment)  54 yo ago   Pain Frequency Intermittent                         [facility] Adult PT Treatment/Exercise - 10/16/14 1437    Ambulation/Gait   Ambulation/Gait Yes   Ambulation Distance (Feet) 158 Feet   Gait Comments ~2-3 min bouts with rest, 97-94 % SO2 with mild SOB  pre-walking 136/68, 132/76   Posture/Postural Control   Posture Comments Dynamic  Stabilization 3 15 reps  cues for increased thoracic stability   Exercises   Exercises Knee/Hip  Figure 4 and cross over thigh (belt)    Manual Therapy   Manual Therapy Joint mobilization;Soft tissue mobilization   Manual therapy comments pre-Tx: noted trigger point on R lat glut  and c.o pain, Post-Tx: decreased tenderness and tensions   Joint Mobilization rotational mob grade III 90 sec 1 bout   Soft tissue mobilization trigger point and STM on problem spot                PT Education - 10/16/14 1511    Education provided Yes   Education Details HEP, gradual progression of aerobic activity this week with short bouts of walking and hold on stairclimber until next week. Sleep position w/ pillows             PT Long Term Goals - 10/16/14 1606    PT LONG TERM GOAL #1   Title Pt will decrease her UDI score from 100% to < 80% in order to show  improved urogenital function and improve QOL.   Time 12   Period Weeks   Status Partially Met   PT LONG TERM GOAL #2   Title Pt will be compliant with CPAP use to improve quality of sleep and decrease urgency to urinate at night from 8-10x to < 6 x.    Time 12   Period Weeks   Status Partially Met   PT LONG TERM GOAL #3   Title Pt will demo proper diaphragmatic breathing and pelvic flor coordination for bowel momvent, OOB technique, and lifting in order restore pelvic floor function.    Time 12   Period Weeks   Status Achieved   PT LONG TERM GOAL #4   Title Pt will be compliant with relaxation techniques to improve sleep/urinary urgency.   Time 12   Period Weeks   Status Achieved   PT LONG TERM GOAL #5   Title Pt will be compliant with decreasing fluid intake (1/2 gallon of tea/day to < 4 cups/day) and decreasing fluid intake 3 hrs prior to bed.    Time 12   Status Achieved   PT LONG TERM GOAL #6   Title Pt will report no bowel episode that limits her from leaving her house for 1 week in order to increase participatin in the community.    Time 12   Period Weeks   Status Achieved               Plan - 10/16/14 1516    Clinical Impression Statement Pt showed minor SOB and no drop in SO2 while walking a distance of 158 ft with 4 rest breaks (~2-3 min bouts) (Standing rest breaks occurred during use of elevators and stopping to speak with Dr. Trena Platt office to f/u w/ sleep study recommendation).  Pt's R glut pain was decreased with manual Tx and sleep position modification with pillows. Pt progressed to Dynamic Stabilization Level 3 with minor cuing and pt reported relief for her back after ex. Provided pt info on [employer].. Pt is progressing well towards goals and will continue to benefit form skilled PT to advance towards strengthening in upright positions and integration back into fitness routine while her  cardiac conditions are showing more stability. provided to pt  info on yoga sessions per her interest, provided pt forms to give to Dr. Marzetta Board for [employer] program, pt received form to bring to Dr. Lawson Fiscal  office from Dr. Trena Platt office when PT and pt walked to clinic to f/u. (Dr. Trena Platt front desk staff stated that pt has to get PCP to sign on sleep study order due to pt' being a Medicaid pt (2ndary Ins)    Pt will benefit from skilled therapeutic intervention in order to improve on the following deficits Abnormal gait;Cardiopulmonary status limiting activity;Decreased mobility;Decreased scar mobility;Increased fascial restricitons;Hypomobility;Postural dysfunction;Improper body mechanics;Decreased activity tolerance;Obesity;Other (comment)  lack of education on bladder health    Rehab Potential Good   Clinical Impairments Affecting Rehab Potential Prognosis is limited by pt's poor compliance to CPAP use for OSA, alcohol and nicotine use, Hx of abdominal surgeries, and co-morbidities. Pt demo'd poor breathing mechanics, increased loading on pelvic floor with poor body mechanics and poor posture. Pt also demo'd abdominal scars with poor mobility. Pt declined internal assessment/ doffing of clothes.     PT Frequency 1x / week   PT Duration 12 weeks   PT Treatment/Interventions ADLs/Self Care Home Management;Electrical Stimulation;Biofeedback;Moist Heat;Stair training;Functional mobility training;Therapeutic activities;Therapeutic exercise;Balance training;Dry needling;Manual techniques;Neuromuscular re-education;Patient/family education;Energy conservation;Cryotherapy;Scar mobilization   PT Next Visit Plan reasses thorax, initiate abdominal massage    Consulted and Agree with Plan of Care Patient        Problem List Patient Active Problem List   Diagnosis Date Noted  . Subclinical hyperthyroidism 06/26/2014  . Toxic multinodular goiter 06/26/2014  . Hypertension 06/26/2014  . Hepatitis C 06/26/2014  . Tobacco dependence 06/26/2014  . Bipolar disorder  06/26/2014  . Recurrent ventral hernia 11/08/2012    Jerl Mina ,PT, DPT, E-RYT   10/16/2014, 4:12 PM  [place] MAIN [facility] SERVICES [address] [place], [place], [address] Phone: [phone]   Fax:  [phone]

## 2014-10-16 NOTE — Patient Instructions (Addendum)
Practice HEP for glut stretches from last week and  Progress to level 3 of Dynamic Stabilization  Walk  46min bouts 5x / 2x day with rests in between, and hold on using stairclimber until next week after PT assessment  (build endurance gradually). Engage deep core mm with exhalations.  Provide Dr. Marzetta Board form for sleep study and [place] [facility] Sleeping position with two pillows (one in front of bottom knee and one between knees) to decrease R glut pain

## 2014-10-21 ENCOUNTER — Ambulatory Visit (INDEPENDENT_AMBULATORY_CARE_PROVIDER_SITE_OTHER): Payer: Medicare Other | Admitting: Family Medicine

## 2014-10-21 ENCOUNTER — Encounter: Payer: Self-pay | Admitting: Family Medicine

## 2014-10-21 VITALS — BP 129/86 | HR 98 | Temp 98.2°F | Wt 207.0 lb

## 2014-10-21 DIAGNOSIS — I10 Essential (primary) hypertension: Secondary | ICD-10-CM | POA: Diagnosis not present

## 2014-10-21 DIAGNOSIS — F17200 Nicotine dependence, unspecified, uncomplicated: Secondary | ICD-10-CM

## 2014-10-21 MED ORDER — AMLODIPINE BESYLATE 5 MG PO TABS: 5.0000 mg | ORAL_TABLET | Freq: Every day | ORAL | Status: DC

## 2014-10-21 MED ORDER — ATENOLOL 25 MG PO TABS: 25.0000 mg | ORAL_TABLET | Freq: Every day | ORAL | Status: DC

## 2014-10-21 NOTE — Assessment & Plan Note (Signed)
Improved; patient requests changing medicine; will increase atenolol and decrease amlodipine; monitor and call, discussed goal, DASH guidelines encouraged; stress reduction

## 2014-10-21 NOTE — Patient Instructions (Addendum)
Your goal blood pressure is less than  140 on top and less than 90 on the bottom. Try to follow the DASH guidelines (DASH stands for Dietary Approaches to Stop Hypertension) Try to limit the sodium in your diet.  Ideally, consume less than 1.5 grams (less than 1,500mg ) per day. Do not add salt when cooking or at the table.  Check the sodium amount on labels when shopping, and choose items lower in sodium when given a choice. Avoid or limit foods that already contain a lot of sodium. Eat a diet rich in fruits and vegetables and whole grains. Increase the atenolol to 25 mg daily Decrease the amlodipine to 5 mg daily Check your blood pressure and just call me if either low numbers or high numbers Keep up the efforts with smoking cessation and smoking less -- you can do this!  DASH Eating Plan DASH stands for "Dietary Approaches to Stop Hypertension." The DASH eating plan is a healthy eating plan that has been shown to reduce high blood pressure (hypertension). Additional health benefits may include reducing the risk of type 2 diabetes mellitus, heart disease, and stroke. The DASH eating plan may also help with weight loss. WHAT DO I NEED TO KNOW ABOUT THE DASH EATING PLAN? For the DASH eating plan, you will follow these general guidelines:  Choose foods with a percent daily value for sodium of less than 5% (as listed on the food label).  Use salt-free seasonings or herbs instead of table salt or sea salt.  Check with your health care provider or pharmacist before using salt substitutes.  Eat lower-sodium products, often labeled as "lower sodium" or "no salt added."  Eat fresh foods.  Eat more vegetables, fruits, and low-fat dairy products.  Choose whole grains. Look for the word "whole" as the first word in the ingredient list.  Choose fish and skinless chicken or [place] more often than red meat. Limit fish, poultry, and meat to 6 oz (170 g) each day.  Limit sweets, desserts, sugars, and  sugary drinks.  Choose heart-healthy fats.  Limit cheese to 1 oz (28 g) per day.  Eat more home-cooked food and less restaurant, buffet, and fast food.  Limit fried foods.  Cook foods using methods other than frying.  Limit canned vegetables. If you do use them, rinse them well to decrease the sodium.  When eating at a restaurant, ask that your food be prepared with less salt, or no salt if possible. WHAT FOODS CAN I EAT? Seek help from a dietitian for individual calorie needs. Grains Whole grain or whole wheat bread. Brown rice. Whole grain or whole wheat pasta. Quinoa, bulgur, and whole grain cereals. Low-sodium cereals. Corn or whole wheat flour tortillas. Whole grain cornbread. Whole grain crackers. Low-sodium crackers. Vegetables Fresh or frozen vegetables (raw, steamed, roasted, or grilled). Low-sodium or reduced-sodium tomato and vegetable juices. Low-sodium or reduced-sodium tomato sauce and paste. Low-sodium or reduced-sodium canned vegetables.  Fruits All fresh, canned (in natural juice), or frozen fruits. Meat and Other Protein Products Ground beef (85% or leaner), grass-fed beef, or beef trimmed of fat. Skinless chicken or [place]. Ground chicken or [place]. Pork trimmed of fat. All fish and seafood. Eggs. Dried beans, peas, or lentils. Unsalted nuts and seeds. Unsalted canned beans. Dairy Low-fat dairy products, such as skim or 1% milk, 2% or reduced-fat cheeses, low-fat ricotta or cottage cheese, or plain low-fat yogurt. Low-sodium or reduced-sodium cheeses. Fats and Oils Tub margarines without trans fats. Light or reduced-fat mayonnaise  and salad dressings (reduced sodium). Avocado. Safflower, olive, or canola oils. Natural peanut or almond butter. Other Unsalted popcorn and pretzels. The items listed above may not be a complete list of recommended foods or beverages. Contact your dietitian for more options. WHAT FOODS ARE NOT RECOMMENDED? Grains White bread. White  pasta. White rice. Refined cornbread. Bagels and croissants. Crackers that contain trans fat. Vegetables Creamed or fried vegetables. Vegetables in a cheese sauce. Regular canned vegetables. Regular canned tomato sauce and paste. Regular tomato and vegetable juices. Fruits Dried fruits. Canned fruit in light or heavy syrup. Fruit juice. Meat and Other Protein Products Fatty cuts of meat. Ribs, chicken wings, bacon, sausage, bologna, salami, chitterlings, fatback, hot dogs, bratwurst, and packaged luncheon meats. Salted nuts and seeds. Canned beans with salt. Dairy Whole or 2% milk, cream, half-and-half, and cream cheese. Whole-fat or sweetened yogurt. Full-fat cheeses or blue cheese. Nondairy creamers and whipped toppings. Processed cheese, cheese spreads, or cheese curds. Condiments Onion and garlic salt, seasoned salt, table salt, and sea salt. Canned and packaged gravies. Worcestershire sauce. Tartar sauce. Barbecue sauce. Teriyaki sauce. Soy sauce, including reduced sodium. Steak sauce. Fish sauce. Oyster sauce. Cocktail sauce. Horseradish. Ketchup and mustard. Meat flavorings and tenderizers. Bouillon cubes. Hot sauce. Tabasco sauce. Marinades. Taco seasonings. Relishes. Fats and Oils Butter, stick margarine, lard, shortening, ghee, and bacon fat. Coconut, palm kernel, or palm oils. Regular salad dressings. Other Pickles and olives. Salted popcorn and pretzels. The items listed above may not be a complete list of foods and beverages to avoid. Contact your dietitian for more information. WHERE CAN I FIND MORE INFORMATION? National Heart, Lung, and Blood Institute: travelstabloid.com Document Released: 04/14/2011 Document Revised: 09/09/2013 Document Reviewed: 02/27/2013 [facility] Patient Information 2015 [place], [place]. This information is not intended to replace advice given to you by your health care provider. Make sure you discuss any questions you have with  your health care provider.

## 2014-10-21 NOTE — Assessment & Plan Note (Signed)
She is doing better, smoking less, encouragement given

## 2014-10-21 NOTE — Progress Notes (Signed)
BP 129/86 mmHg  Pulse 98  Temp(Src) 98.2 F (36.8 C)  Wt 207 lb (93.895 kg)  SpO2 96%   Subjective:    Patient ID: Susan [name], female    DOB: 1960-06-13, 54 y.o.   MRN: [id number]  HPI: Susan [name] is a 54 y.o. female  Chief Complaint  Patient presents with  . Hypertension   Hypertension Patient has had hypertension since years Checking blood pressure away from here?  yes Range (low to high) over last two weeks: 120 -133 / 80 - 91 One day it was 112/51 at the bladder therapist's office; felt like her head was going to explode, like her eyes felt funny; her machine at home is very consistent Feels blood pressure is under very good control Hypertension-associated complications:  none If taking medicines, are you taking them regularly?  yes Siblings / family history: Does high blood pressure run in your family?   no Salt:  Trying to limit sodium / salt when buying foods at the grocery store?  yes  Do you try to limit added salt when cooking and at the table?  yes Sedentary lifestyle:  Exercise/activity level:  frequent (three or more times per week) Steroids/Non-steroidals:  Have you had a recent cortisone shot in the last few months?  no  Do you take prednisone or prescription NSAIDs, no , or take OTCS NSAIDs such as ibuprofen, Motrin, Advil, Aleve, or naproxen? Rare, took two ibuprofen last week Smoking: Do you smoke?  YES Snoring / sleep apnea: Do you snore or have sleep apnea?  YES  Going to have sleep study soon  She is working with the bladder therapist; doing breathing and sitting and posture; she wants patient to do yoga; limiting gym per the therapist's instructions; they have cut caffeine, more water; watching what she eats  She is down to half of a pack of cigarettes per day  She is on disability from her psychiatrist; on disability for 4 years; she sees them every 3 months, sees therapist every 2 weeks and has group every 1 week  Relevant past medical,  surgical, family and social history reviewed and updated as indicated. Interim medical history since our last visit reviewed. Allergies and medications reviewed and updated.  Review of Systems  Gastrointestinal: Negative for constipation (good and regular; daily bowel movements, she is pleased with this).  Musculoskeletal: Positive for back pain (doing better with posture and therapy exercises).  Neurological: Positive for headaches (last week, took ibuprofen took it away).    Per HPI unless specifically indicated above     Objective:    BP 129/86 mmHg  Pulse 98  Temp(Src) 98.2 F (36.8 C)  Wt 207 lb (93.895 kg)  SpO2 96%  Wt Readings from Last 3 Encounters:  10/21/14 207 lb (93.895 kg)  09/30/14 208 lb (94.348 kg)  09/30/14 208 lb (94.348 kg)    Physical Exam  Constitutional: She appears well-developed and well-nourished. No distress.  HENT:  Head: Normocephalic and atraumatic.  Eyes: EOM are normal. No scleral icterus.  Neck: No thyromegaly present.  Cardiovascular: Normal rate, regular rhythm and normal heart sounds.   No murmur heard. Pulmonary/Chest: Effort normal and breath sounds normal. No respiratory distress. She has no wheezes.  Abdominal: Soft. She exhibits no distension.  Musculoskeletal: Normal range of motion. She exhibits no edema.  Neurological: She is alert. She exhibits normal muscle tone.  Skin: Skin is warm and dry. She is not diaphoretic. No pallor.  Psychiatric:  She has a normal mood and affect. Her behavior is normal. Judgment and thought content normal.    Results for orders placed or performed in visit on 07/10/14  TSH  Result Value Ref Range   TSH 0.34 (A) .41 - 5.90 uIU/mL      Assessment & Plan:   Problem List Items Addressed This Visit    None       Follow up plan: No Follow-up on file.

## 2014-10-22 DIAGNOSIS — F4310 Post-traumatic stress disorder, unspecified: Secondary | ICD-10-CM | POA: Diagnosis not present

## 2014-10-23 ENCOUNTER — Encounter: Payer: Self-pay | Admitting: Physical Therapy

## 2014-10-23 ENCOUNTER — Ambulatory Visit: Payer: Medicare Other | Admitting: Physical Therapy

## 2014-10-23 VITALS — BP 131/80

## 2014-10-23 DIAGNOSIS — R279 Unspecified lack of coordination: Secondary | ICD-10-CM | POA: Diagnosis not present

## 2014-10-23 DIAGNOSIS — Z7409 Other reduced mobility: Secondary | ICD-10-CM | POA: Diagnosis not present

## 2014-10-23 DIAGNOSIS — R2689 Other abnormalities of gait and mobility: Secondary | ICD-10-CM

## 2014-10-23 DIAGNOSIS — S39001S Unspecified injury of muscle, fascia and tendon of abdomen, sequela: Secondary | ICD-10-CM

## 2014-10-23 DIAGNOSIS — R350 Frequency of micturition: Secondary | ICD-10-CM | POA: Diagnosis not present

## 2014-10-24 NOTE — Therapy (Signed)
[place] MAIN [facility] SERVICES [address] [place], [place], [address] Phone: [phone]   Fax:  [phone]   PHYSICAL THERAPY DISCHARGE SUMMARY  Visits from Start of Care: 5  Current functional level related to goals / functional outcomes: See below   Remaining deficits: none   Education / Equipment: See below  Plan: Patient agrees to discharge.  Patient goals were partially met. Patient is being discharged due to meeting the stated rehab goals.  ?????          Physical Therapy Treatment  Patient Details  Name: Susan [name] MRN: [id number] Date of Birth: [birth year] Referring Provider:  Arnetha Courser, MD  Encounter Date: 10/23/2014    Past Medical History  Diagnosis Date  . Thyroid disease     hyperthyroid  . 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 Hepatitis C   . Depression   . Anxiety   . Bipolar 1 disorder   . History of alcohol abuse   . History of hepatitis C   . Hypertension   . History of MRSA infection 2013  . History of diverticulitis 2013  . H/O suicide attempt     slit wrists  . Osteoporosis   . Arthritis     rheumatoid arthritis  . Sleep apnea     Pt discontinue use of CPAP last year, Dx 3 years ago.   . OSA (obstructive sleep apnea)     managed by Dr. Manuella Ghazi  . Hypothyroidism   . Multinodular thyroid   . Vitamin D deficiency disease   . Vitamin B12 deficiency     Past Surgical History  Procedure Laterality Date  . Salpingoophorectomy Bilateral 11-13-2011  . Breast surgery Left 20 yrs ago  . Cesarean section    . Hernia repair  06/2011, July 2014    Ventral wall repair with Physiomesh  . Bilateral salpingoophorectomy  July 2013    due to abnormal mass    Filed Vitals:   10/23/14 1406  BP: 131/80    Visit Diagnosis:  Lack of coordination  Decreased mobility  Unspecified injury of muscle, fascia and tendon of abdomen, sequela                                     PT Long Term Goals - 10/23/14 1409    PT LONG TERM GOAL #1   Title Pt will decrease her UDI score from 100% to < 80% in order to show improved urogenital function and improve QOL. (10/23/14: 41.7% )    Time 12   Period Weeks   Status Achieved   PT LONG TERM GOAL #2   Title Pt will be decrease urination at night from 8-10x to < 6 x.  (10/23/14: Pt reported 1x/night the past week without dribbling post-voiding)    Time 12   Period Weeks   Status Achieved   PT LONG TERM GOAL #3   Title Pt will demo proper diaphragmatic breathing and pelvic flor coordination for bowel momvent, OOB technique, and lifting in order restore pelvic floor function.    Time 12   Period Weeks   Status Achieved   PT LONG TERM GOAL #4   Title Pt will be compliant with relaxation techniques to improve sleep/urinary urgency.   Time 12   Period Weeks   Status Achieved   PT LONG TERM GOAL #5   Title Pt will be  compliant with decreasing fluid intake (1/2 gallon of tea/day to < 4 cups/day) and decreasing fluid intake 3 hrs prior to bed.    Time 12   Status Achieved   PT LONG TERM GOAL #6   Title Pt will report no bowel episode that limits her from leaving her house for 1 week in order to increase participatin in the community.    Time 12   Period Weeks   Status Achieved               Plan - 11-23-2014 07-19-42    Clinical Impression Statement Pt has achieved 100% of her goals with significantly decreased scores in UDI from 100% to 47.4%, decreased nocturia from 8-10x/ night to 1x/night without post-void dribble, increased relaxation practice, and improved fluid intake for optimal bladder health. Pt also reported her low back pain has significantly improved as well. Pt's cardiac conditions have stabilized with medication changes made by her PCP and she is due for sleep study this week which PT had advocated and faciliated her to f/u due to her previous poor compliance. Pt was educated on health and wellness post-D/C  and pt expressed continued motivation to exercise,  decrease smoking, and lose weight. Pt expressed she is interested in community yoga classes at [facility] and resume treadmill walking at her gym.    Pt will benefit from skilled therapeutic intervention in order to improve on the following deficits Abnormal gait;Cardiopulmonary status limiting activity;Decreased mobility;Decreased scar mobility;Increased fascial restricitons;Hypomobility;Postural dysfunction;Improper body mechanics;Decreased activity tolerance;Obesity;Other (comment)  lack of education on bladder health    Rehab Potential Good   Clinical Impairments Affecting Rehab Potential Prognosis is limited by pt's poor compliance to CPAP use for OSA, alcohol and nicotine use, Hx of abdominal surgeries, and co-morbidities. Pt demo'd poor breathing mechanics, increased loading on pelvic floor with poor body mechanics and poor posture. Pt also demo'd abdominal scars with poor mobility. Pt declined internal assessment/ doffing of clothes.     PT Frequency 1x / week   PT Duration 12 weeks   PT Treatment/Interventions ADLs/Self Care Home Management;Electrical Stimulation;Biofeedback;Moist Heat;Stair training;Functional mobility training;Therapeutic activities;Therapeutic exercise;Balance training;Dry needling;Manual techniques;Neuromuscular re-education;Patient/family education;Energy conservation;Cryotherapy;Scar mobilization   PT Next Visit Plan D/C today   Consulted and Agree with Plan of Care Patient          G-Codes - Nov 23, 2014 July 19, 2241    Functional Assessment Tool Used UDI-6   Functional Limitation Self care   Self Care Current Status [phone]) At least 80 percent but less than 100 percent impaired, limited or restricted   Self Care Goal Status (H2122) At least 60 percent but less than 80 percent impaired, limited or restricted   Self Care Discharge Status [phone]) At least 40 percent but less than 60 percent impaired, limited or restricted       Problem List Patient Active Problem List   Diagnosis Date Noted  . Subclinical hyperthyroidism 06/26/2014  . Toxic multinodular goiter 06/26/2014  . Hypertension 06/26/2014  . Hepatitis C 06/26/2014  . Tobacco dependence 06/26/2014  . Bipolar disorder 06/26/2014  . Recurrent ventral hernia 11/08/2012    Jerl Mina ,PT, DPT, E-RYT   11-23-2014, 10:49 PM  [place] MAIN [facility] SERVICES [address] [place], [place], [address] Phone: [phone]   Fax:  [phone]

## 2014-10-28 ENCOUNTER — Encounter: Payer: Medicare Other | Admitting: Physical Therapy

## 2014-10-29 DIAGNOSIS — F4310 Post-traumatic stress disorder, unspecified: Secondary | ICD-10-CM | POA: Diagnosis not present

## 2014-10-30 ENCOUNTER — Emergency Department
Admission: EM | Admit: 2014-10-30 | Discharge: 2014-10-30 | Disposition: A | Discharge status: Home / Self Care | Payer: Medicare Other | Attending: Emergency Medicine | Admitting: Emergency Medicine

## 2014-10-30 ENCOUNTER — Emergency Department: Payer: Medicare Other

## 2014-10-30 ENCOUNTER — Encounter: Payer: Self-pay | Admitting: Emergency Medicine

## 2014-10-30 DIAGNOSIS — Y9289 Other specified places as the place of occurrence of the external cause: Secondary | ICD-10-CM | POA: Insufficient documentation

## 2014-10-30 DIAGNOSIS — W1839XA Other fall on same level, initial encounter: Secondary | ICD-10-CM | POA: Insufficient documentation

## 2014-10-30 DIAGNOSIS — Y998 Other external cause status: Secondary | ICD-10-CM | POA: Diagnosis not present

## 2014-10-30 DIAGNOSIS — Z79899 Other long term (current) drug therapy: Secondary | ICD-10-CM | POA: Diagnosis not present

## 2014-10-30 DIAGNOSIS — Z7951 Long term (current) use of inhaled steroids: Secondary | ICD-10-CM | POA: Insufficient documentation

## 2014-10-30 DIAGNOSIS — I10 Essential (primary) hypertension: Secondary | ICD-10-CM | POA: Insufficient documentation

## 2014-10-30 DIAGNOSIS — Z720 Tobacco use: Secondary | ICD-10-CM | POA: Diagnosis not present

## 2014-10-30 DIAGNOSIS — S2232XA Fracture of one rib, left side, initial encounter for closed fracture: Secondary | ICD-10-CM | POA: Diagnosis not present

## 2014-10-30 DIAGNOSIS — Y9389 Activity, other specified: Secondary | ICD-10-CM | POA: Diagnosis not present

## 2014-10-30 DIAGNOSIS — S299XXA Unspecified injury of thorax, initial encounter: Secondary | ICD-10-CM | POA: Diagnosis present

## 2014-10-30 MED ORDER — KETOROLAC TROMETHAMINE 60 MG/2ML IM SOLN
60.0000 mg | Freq: Once | INTRAMUSCULAR | Status: AC
  Administered 2014-10-30: 60 mg via INTRAMUSCULAR

## 2014-10-30 MED ORDER — METHOCARBAMOL 500 MG PO TABS: 500.0000 mg | ORAL_TABLET | Freq: Four times a day (QID) | ORAL | Status: DC | PRN

## 2014-10-30 MED ORDER — KETOROLAC TROMETHAMINE 60 MG/2ML IM SOLN
INTRAMUSCULAR | Status: AC
  Administered 2014-10-30: 60 mg via INTRAMUSCULAR
  Filled 2014-10-30: qty 2

## 2014-10-30 MED ORDER — HYDROCODONE-ACETAMINOPHEN 5-325 MG PO TABS: 1.0000 | ORAL_TABLET | Freq: Four times a day (QID) | ORAL | Status: DC | PRN

## 2014-10-30 MED ORDER — KETOROLAC TROMETHAMINE 10 MG PO TABS: 10.0000 mg | ORAL_TABLET | Freq: Four times a day (QID) | ORAL | Status: DC | PRN

## 2014-10-30 NOTE — ED Notes (Signed)
Pt states she slipped on Sunday and now has increasing left rib pain and states she can not take a deep breath or cough

## 2014-10-30 NOTE — ED Notes (Signed)
Pt to ED with c/o left rib pain, states she slipped on Saturday and landed on couch with left rib area, continues to have left rib pain that is worse with movement and inspiration

## 2014-10-30 NOTE — ED Notes (Signed)
Pt given incentive spirometer.

## 2014-10-30 NOTE — ED Provider Notes (Signed)
[facility] Emergency Department Provider Note  ____________________________________________  Time seen: 0900 I have reviewed the triage vital signs and the nursing notes.   HISTORY  Chief Complaint Rib Injury    HPI Susan [name] is a 54 y.o. female is here today with left-sided rib pain states she fell down landing on the couch 4 days ago now the pain is increasing every time she takes a deep breath denies any cracks or pops any fevers or chills denies any previous injury she is arrived here today for further evaluation treatment rates pain as 8 out of 10 with certain movements or deep breaths nothing making it particular better or worse    Past Medical History  Diagnosis Date  . Thyroid disease     hyperthyroid  . 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 Hepatitis C   . Depression   . Anxiety   . Bipolar 1 disorder   . History of alcohol abuse   . History of hepatitis C   . Hypertension   . History of MRSA infection 2013  . History of diverticulitis 2013  . H/O suicide attempt     slit wrists  . Osteoporosis   . Arthritis     rheumatoid arthritis  . Sleep apnea     Pt discontinue use of CPAP last year, Dx 3 years ago.   . OSA (obstructive sleep apnea)     managed by Dr. Manuella Ghazi  . Hypothyroidism   . Multinodular thyroid   . Vitamin D deficiency disease   . Vitamin B12 deficiency     Patient Active Problem List   Diagnosis Date Noted  . Subclinical hyperthyroidism 06/26/2014  . Toxic multinodular goiter 06/26/2014  . Hypertension 06/26/2014  . Hepatitis C 06/26/2014  . Tobacco dependence 06/26/2014  . Bipolar disorder 06/26/2014  . Recurrent ventral hernia 11/08/2012    Past Surgical History  Procedure Laterality Date  . Salpingoophorectomy Bilateral 11-13-2011  . Breast surgery Left 20 yrs ago  . Cesarean section    . Hernia repair  06/2011, July 2014    Ventral wall repair with Physiomesh  . Bilateral salpingoophorectomy  July 2013    due to  abnormal mass    Current Outpatient Rx  Name  Route  Sig  Dispense  Refill  . albuterol (PROVENTIL HFA;VENTOLIN HFA) 108 (90 BASE) MCG/ACT inhaler   Inhalation   Inhale 2 puffs into the lungs every 4 (four) hours as needed for wheezing or shortness of breath.          Marland Kitchen amLODipine (NORVASC) 5 MG tablet   Oral   Take 1 tablet (5 mg total) by mouth daily.   90 tablet   3     Decreasing this dose as we increase the beta-block ...   . atenolol (TENORMIN) 25 MG tablet   Oral   Take 1 tablet (25 mg total) by mouth daily.   90 tablet   1     New instructions   . busPIRone (BUSPAR) 15 MG tablet   Oral   Take 15 mg by mouth 2 (two) times daily. One every morning and one and one-half every evening         . clonazePAM (KLONOPIN) 0.5 MG tablet   Oral   Take 0.5 mg by mouth 3 (three) times daily as needed for anxiety.         . cyanocobalamin (,VITAMIN B-12,) 1000 MCG/ML injection   Intramuscular   Inject 1,000 mcg into  the muscle once.         . cyclobenzaprine (FLEXERIL) 10 MG tablet   Oral   Take 10 mg by mouth at bedtime as needed for muscle spasms.         . DULoxetine (CYMBALTA) 30 MG capsule   Oral   Take 60 mg by mouth daily.          . ergocalciferol (VITAMIN D2) 50000 UNITS capsule   Oral   Take 50,000 Units by mouth once a week.         . fexofenadine (ALLEGRA) 180 MG tablet   Oral   Take 180 mg by mouth daily.         . fluticasone (FLONASE) 50 MCG/ACT nasal spray   Each Nare   Place 2 sprays into both nostrils daily.         Marland Kitchen gabapentin (NEURONTIN) 300 MG capsule   Oral   Take 300 mg by mouth 3 (three) times daily.         Marland Kitchen HYDROcodone-acetaminophen (NORCO) 5-325 MG per tablet   Oral   Take 1 tablet by mouth every 6 (six) hours as needed for moderate pain or severe pain.   12 tablet   0   . ketorolac (TORADOL) 10 MG tablet   Oral   Take 1 tablet (10 mg total) by mouth every 6 (six) hours as needed.   20 tablet   0   .  losartan (COZAAR) 100 MG tablet   Oral   Take 100 mg by mouth daily.         . methocarbamol (ROBAXIN) 500 MG tablet   Oral   Take 1 tablet (500 mg total) by mouth every 6 (six) hours as needed for muscle spasms.   16 tablet   0   . omeprazole (PRILOSEC) 20 MG capsule   Oral   Take 20 mg by mouth every other day. Take every other day .         . traMADol (ULTRAM) 50 MG tablet   Oral   Take 50 mg by mouth every 6 (six) hours as needed.         . traZODone (DESYREL) 100 MG tablet   Oral   Take 100 mg by mouth at bedtime. Take 1/2 to 1 whole pill by mouth if needed for sleep         . varenicline (CHANTIX) 1 MG tablet   Oral   Take 1 mg by mouth 2 (two) times daily.           Allergies Hydrochlorothiazide and Lasix  Family History  Problem Relation Age of Onset  . Alcohol abuse Father   . Arthritis Mother   . Asthma Mother   . Mental illness Mother   . Thyroid disease Mother   . Arthritis Brother   . Cancer Brother   . Mental illness Brother   . Alcohol abuse Sister   . Drug abuse Sister   . Mental illness Sister     Social History History  Substance Use Topics  . Smoking status: Current Every Day Smoker -- 1.00 packs/day for 30 years    Types: Cigarettes  . Smokeless tobacco: Never Used  . Alcohol Use: 4.8 oz/week    8 Cans of beer per week    Review of Systems Constitutional: No fever/chills Eyes: No visual changes. ENT: No sore throat. Cardiovascular: Denies chest pain. Respiratory: Denies shortness of breath. Gastrointestinal: No abdominal pain.  No nausea,  no vomiting.  No diarrhea.  No constipation. Genitourinary: Negative for dysuria. Musculoskeletal: Negative for back pain. Skin: Negative for rash. Neurological: Negative for headaches, focal weakness or numbness.  10-point ROS otherwise negative.  ____________________________________________   PHYSICAL EXAM:  VITAL SIGNS: ED Triage Vitals  Enc Vitals Group     BP 10/30/14  0714 156/74 mmHg     Pulse Rate 10/30/14 0714 71     Resp 10/30/14 0714 18     Temp 10/30/14 0714 98.1 F (36.7 C)     Temp Source 10/30/14 0714 Oral     SpO2 10/30/14 0714 95 %     Weight 10/30/14 0714 207 lb (93.895 kg)     Height 10/30/14 0714 5\' 2"  (1.575 m)     Head Cir --      Peak Flow --      Pain Score 10/30/14 0715 8     Pain Loc --      Pain Edu? --      Excl. in [place]? --     Constitutional: Alert and oriented. Well appearing and in no acute distress. Eyes: Conjunctivae are normal. PERRL. EOMI. Head: Atraumatic. Nose: No congestion/rhinnorhea. Mouth/Throat: Mucous membranes are moist.  Oropharynx non-erythematous. Neck: No stridor.   Cardiovascular: Normal rate, regular rhythm. Grossly normal heart sounds.  Good peripheral circulation. Respiratory: Normal respiratory effort.  No retractions. Lungs CTAB. Gastrointestinal: Soft and nontender. No distention. No abdominal bruits. No CVA tenderness. Musculoskeletal: No lower extremity tenderness nor edema.  No joint effusions. N with palpation along the left anterior chest wall area of ribs 6 through 10 with some bruising to this area no palpable deformity or abnormality Neurologic:  Normal speech and language. No gross focal neurologic deficits are appreciated. Speech is normal. No gait instability. Skin:  Skin is warm, dry and intact. No rash noted. Psychiatric: Mood and affect are normal. Speech and behavior are normal.  ____________________________________________  RADIOLOGY  IMPRESSION: Acute incomplete fracture anterior left seventh rib. Localized left base atelectasis. No effusion or pneumothorax. Old healed rib fractures more superiorly on the left.   Electronically Signed By: [name] Grip Tad Fancher M.D. On: 10/30/2014 09:25 ____________________________________________   PROCEDURES  Procedure(s) performed: None  Critical Care performed: No  ____________________________________________   INITIAL  IMPRESSION / ASSESSMENT AND PLAN / ED COURSE  Pertinent labs & imaging results that were available during my care of the patient were reviewed by me and considered in my medical decision making (see chart for details).  She'll impression rib fracture patient was started on pain management to follow-up with her primary care provider she is also provided an incentive spirometer return here for any acute concerns or worsening symptoms ____________________________________________   FINAL CLINICAL IMPRESSION(S) / ED DIAGNOSES  Final diagnoses:  Left rib fracture, closed, initial encounter     Emonnie Cannady Verdene Rio, PA-C 10/30/14 [place], MD 10/30/14 1427

## 2014-10-30 NOTE — Discharge Instructions (Signed)
Blunt Chest Trauma °Blunt chest trauma is an injury caused by a blow to the chest. These chest injuries can be very painful. Blunt chest trauma often results in bruised or broken (fractured) ribs. Most cases of bruised and fractured ribs from blunt chest traumas get better after 1 to 3 weeks of rest and pain medicine. Often, the soft tissue in the chest wall is also injured, causing pain and bruising. Internal organs, such as the heart and lungs, may also be injured. Blunt chest trauma can lead to serious medical problems. This injury requires immediate medical care. °CAUSES  °· Motor vehicle collisions. °· Falls. °· Physical violence. °· Sports injuries. °SYMPTOMS  °· Chest pain. The pain may be worse when you move or breathe deeply. °· Shortness of breath. °· Lightheadedness. °· Bruising. °· Tenderness. °· Swelling. °DIAGNOSIS  °Your caregiver will do a physical exam. X-rays may be taken to look for fractures. However, minor rib fractures may not show up on X-rays until a few days after the injury. If a more serious injury is suspected, further imaging tests may be done. This may include ultrasounds, computed tomography (CT) scans, or magnetic resonance imaging (MRI). °TREATMENT  °Treatment depends on the severity of your injury. Your caregiver may prescribe pain medicines and deep breathing exercises. °HOME CARE INSTRUCTIONS °· Limit your activities until you can move around without much pain. °· Do not do any strenuous work until your injury is healed. °· Put ice on the injured area. °¨ Put ice in a plastic bag. °¨ Place a towel between your skin and the bag. °¨ Leave the ice on for 15-20 minutes, 03-04 times a day. °· You may wear a rib belt as directed by your caregiver to reduce pain. °· Practice deep breathing as directed by your caregiver to keep your lungs clear. °· Only take over-the-counter or prescription medicines for pain, fever, or discomfort as directed by your caregiver. °SEEK IMMEDIATE MEDICAL  CARE IF:  °· You have increasing pain or shortness of breath. °· You cough up blood. °· You have nausea, vomiting, or abdominal pain. °· You have a fever. °· You feel dizzy, weak, or you faint. °MAKE SURE YOU: °· Understand these instructions. °· Will watch your condition. °· Will get help right away if you are not doing well or get worse. °Document Released: 06/02/2004 Document Revised: 07/18/2011 Document Reviewed: 02/09/2011 °[employer]® Patient Information ©2015 [employer]. This information is not intended to replace advice given to you by your health care provider. Make sure you discuss any questions you have with your health care provider. ° ° °

## 2014-11-04 ENCOUNTER — Ambulatory Visit: Payer: Medicare Other | Attending: Neurology

## 2014-11-04 DIAGNOSIS — G4733 Obstructive sleep apnea (adult) (pediatric): Secondary | ICD-10-CM | POA: Insufficient documentation

## 2014-11-04 DIAGNOSIS — G4761 Periodic limb movement disorder: Secondary | ICD-10-CM | POA: Diagnosis not present

## 2014-11-05 ENCOUNTER — Other Ambulatory Visit: Payer: Self-pay | Admitting: Family Medicine

## 2014-11-05 NOTE — Telephone Encounter (Signed)
Lab Results  Component Value Date   CREATININE 0.8 05/26/2014   Also had creatinine of 0.75 on Sep 30, 2014 (in Programme researcher, broadcasting/film/video) approved

## 2014-11-05 NOTE — Telephone Encounter (Signed)
Routing to provider  

## 2014-11-06 ENCOUNTER — Encounter: Payer: Medicare Other | Admitting: Physical Therapy

## 2014-11-06 DIAGNOSIS — L538 Other specified erythematous conditions: Secondary | ICD-10-CM | POA: Diagnosis not present

## 2014-11-06 DIAGNOSIS — R58 Hemorrhage, not elsewhere classified: Secondary | ICD-10-CM | POA: Diagnosis not present

## 2014-11-06 DIAGNOSIS — F4310 Post-traumatic stress disorder, unspecified: Secondary | ICD-10-CM | POA: Diagnosis not present

## 2014-11-06 DIAGNOSIS — D485 Neoplasm of uncertain behavior of skin: Secondary | ICD-10-CM | POA: Diagnosis not present

## 2014-11-06 DIAGNOSIS — L821 Other seborrheic keratosis: Secondary | ICD-10-CM | POA: Diagnosis not present

## 2014-11-09 DIAGNOSIS — G4733 Obstructive sleep apnea (adult) (pediatric): Secondary | ICD-10-CM | POA: Diagnosis not present

## 2014-11-09 DIAGNOSIS — G4761 Periodic limb movement disorder: Secondary | ICD-10-CM | POA: Diagnosis not present

## 2014-11-12 DIAGNOSIS — F4310 Post-traumatic stress disorder, unspecified: Secondary | ICD-10-CM | POA: Diagnosis not present

## 2014-11-13 ENCOUNTER — Encounter: Payer: Medicare Other | Admitting: Physical Therapy

## 2014-11-20 ENCOUNTER — Encounter: Payer: Medicare Other | Admitting: Physical Therapy

## 2014-11-21 DIAGNOSIS — J019 Acute sinusitis, unspecified: Secondary | ICD-10-CM | POA: Diagnosis not present

## 2014-11-25 DIAGNOSIS — F4310 Post-traumatic stress disorder, unspecified: Secondary | ICD-10-CM | POA: Diagnosis not present

## 2014-12-01 ENCOUNTER — Other Ambulatory Visit: Payer: Self-pay | Admitting: Family Medicine

## 2014-12-04 ENCOUNTER — Ambulatory Visit: Payer: Medicare Other | Attending: Neurology

## 2014-12-04 DIAGNOSIS — G4733 Obstructive sleep apnea (adult) (pediatric): Secondary | ICD-10-CM | POA: Insufficient documentation

## 2014-12-04 DIAGNOSIS — G4761 Periodic limb movement disorder: Secondary | ICD-10-CM | POA: Diagnosis not present

## 2014-12-09 DIAGNOSIS — G4761 Periodic limb movement disorder: Secondary | ICD-10-CM | POA: Diagnosis not present

## 2014-12-09 DIAGNOSIS — G4733 Obstructive sleep apnea (adult) (pediatric): Secondary | ICD-10-CM | POA: Diagnosis not present

## 2014-12-09 DIAGNOSIS — F4310 Post-traumatic stress disorder, unspecified: Secondary | ICD-10-CM | POA: Diagnosis not present

## 2014-12-15 DIAGNOSIS — J019 Acute sinusitis, unspecified: Secondary | ICD-10-CM | POA: Diagnosis not present

## 2014-12-16 DIAGNOSIS — F4310 Post-traumatic stress disorder, unspecified: Secondary | ICD-10-CM | POA: Diagnosis not present

## 2014-12-17 DIAGNOSIS — E0520 Thyrotoxicosis with toxic multinodular goiter without thyrotoxic crisis or storm: Secondary | ICD-10-CM | POA: Diagnosis not present

## 2014-12-17 DIAGNOSIS — E0590 Thyrotoxicosis, unspecified without thyrotoxic crisis or storm: Secondary | ICD-10-CM | POA: Diagnosis not present

## 2014-12-23 DIAGNOSIS — F4310 Post-traumatic stress disorder, unspecified: Secondary | ICD-10-CM | POA: Diagnosis not present

## 2014-12-24 DIAGNOSIS — F17200 Nicotine dependence, unspecified, uncomplicated: Secondary | ICD-10-CM | POA: Diagnosis not present

## 2014-12-24 DIAGNOSIS — F4310 Post-traumatic stress disorder, unspecified: Secondary | ICD-10-CM | POA: Diagnosis not present

## 2014-12-24 DIAGNOSIS — I10 Essential (primary) hypertension: Secondary | ICD-10-CM | POA: Diagnosis not present

## 2014-12-24 DIAGNOSIS — E0520 Thyrotoxicosis with toxic multinodular goiter without thyrotoxic crisis or storm: Secondary | ICD-10-CM | POA: Diagnosis not present

## 2014-12-31 ENCOUNTER — Other Ambulatory Visit: Payer: Self-pay | Admitting: Family Medicine

## 2014-12-31 DIAGNOSIS — E0520 Thyrotoxicosis with toxic multinodular goiter without thyrotoxic crisis or storm: Secondary | ICD-10-CM | POA: Diagnosis not present

## 2014-12-31 NOTE — Telephone Encounter (Signed)
She has a refill remaining on the Ventolin but I can't delete it without deleting both rxs.

## 2015-01-01 DIAGNOSIS — E042 Nontoxic multinodular goiter: Secondary | ICD-10-CM | POA: Diagnosis not present

## 2015-01-07 DIAGNOSIS — F4310 Post-traumatic stress disorder, unspecified: Secondary | ICD-10-CM | POA: Diagnosis not present

## 2015-01-13 DIAGNOSIS — C041 Malignant neoplasm of lateral floor of mouth: Secondary | ICD-10-CM | POA: Diagnosis not present

## 2015-01-14 DIAGNOSIS — F4310 Post-traumatic stress disorder, unspecified: Secondary | ICD-10-CM | POA: Diagnosis not present

## 2015-01-20 DIAGNOSIS — E890 Postprocedural hypothyroidism: Secondary | ICD-10-CM | POA: Diagnosis not present

## 2015-01-21 ENCOUNTER — Ambulatory Visit: Payer: Medicare Other | Admitting: Family Medicine

## 2015-01-26 DIAGNOSIS — M5136 Other intervertebral disc degeneration, lumbar region: Secondary | ICD-10-CM | POA: Diagnosis not present

## 2015-01-26 DIAGNOSIS — M5416 Radiculopathy, lumbar region: Secondary | ICD-10-CM | POA: Diagnosis not present

## 2015-01-26 DIAGNOSIS — M4806 Spinal stenosis, lumbar region: Secondary | ICD-10-CM | POA: Diagnosis not present

## 2015-02-11 ENCOUNTER — Ambulatory Visit: Payer: Medicare Other | Admitting: Obstetrics and Gynecology

## 2015-02-25 DIAGNOSIS — F4310 Post-traumatic stress disorder, unspecified: Secondary | ICD-10-CM | POA: Diagnosis not present

## 2015-03-09 ENCOUNTER — Other Ambulatory Visit: Payer: Self-pay | Admitting: Family Medicine

## 2015-03-09 NOTE — Telephone Encounter (Signed)
Routing to provider  

## 2015-03-09 NOTE — Telephone Encounter (Signed)
rx approved

## 2015-03-23 DIAGNOSIS — M5136 Other intervertebral disc degeneration, lumbar region: Secondary | ICD-10-CM | POA: Diagnosis not present

## 2015-03-23 DIAGNOSIS — M5416 Radiculopathy, lumbar region: Secondary | ICD-10-CM | POA: Diagnosis not present

## 2015-04-01 ENCOUNTER — Ambulatory Visit: Payer: Medicare Other | Admitting: Family Medicine

## 2015-04-09 ENCOUNTER — Ambulatory Visit (INDEPENDENT_AMBULATORY_CARE_PROVIDER_SITE_OTHER): Payer: Medicare Other | Admitting: Family Medicine

## 2015-04-09 ENCOUNTER — Encounter: Payer: Self-pay | Admitting: Family Medicine

## 2015-04-09 VITALS — BP 110/75 | HR 81 | Temp 98.2°F | Ht 61.75 in | Wt 216.0 lb

## 2015-04-09 DIAGNOSIS — F1020 Alcohol dependence, uncomplicated: Secondary | ICD-10-CM | POA: Diagnosis not present

## 2015-04-09 DIAGNOSIS — R358 Other polyuria: Secondary | ICD-10-CM

## 2015-04-09 DIAGNOSIS — E0520 Thyrotoxicosis with toxic multinodular goiter without thyrotoxic crisis or storm: Secondary | ICD-10-CM

## 2015-04-09 DIAGNOSIS — R635 Abnormal weight gain: Secondary | ICD-10-CM | POA: Diagnosis not present

## 2015-04-09 DIAGNOSIS — F1021 Alcohol dependence, in remission: Secondary | ICD-10-CM

## 2015-04-09 DIAGNOSIS — D72820 Lymphocytosis (symptomatic): Secondary | ICD-10-CM | POA: Diagnosis not present

## 2015-04-09 DIAGNOSIS — B1920 Unspecified viral hepatitis C without hepatic coma: Secondary | ICD-10-CM | POA: Diagnosis not present

## 2015-04-09 DIAGNOSIS — F17200 Nicotine dependence, unspecified, uncomplicated: Secondary | ICD-10-CM | POA: Diagnosis not present

## 2015-04-09 DIAGNOSIS — E538 Deficiency of other specified B group vitamins: Secondary | ICD-10-CM | POA: Diagnosis not present

## 2015-04-09 DIAGNOSIS — Z23 Encounter for immunization: Secondary | ICD-10-CM

## 2015-04-09 DIAGNOSIS — E559 Vitamin D deficiency, unspecified: Secondary | ICD-10-CM | POA: Diagnosis not present

## 2015-04-09 DIAGNOSIS — R3589 Other polyuria: Secondary | ICD-10-CM | POA: Insufficient documentation

## 2015-04-09 DIAGNOSIS — Z9989 Dependence on other enabling machines and devices: Secondary | ICD-10-CM

## 2015-04-09 DIAGNOSIS — G4733 Obstructive sleep apnea (adult) (pediatric): Secondary | ICD-10-CM | POA: Diagnosis not present

## 2015-04-09 DIAGNOSIS — Z1239 Encounter for other screening for malignant neoplasm of breast: Secondary | ICD-10-CM

## 2015-04-09 DIAGNOSIS — R7989 Other specified abnormal findings of blood chemistry: Secondary | ICD-10-CM | POA: Diagnosis not present

## 2015-04-09 DIAGNOSIS — R7301 Impaired fasting glucose: Secondary | ICD-10-CM | POA: Diagnosis not present

## 2015-04-09 DIAGNOSIS — Z5181 Encounter for therapeutic drug level monitoring: Secondary | ICD-10-CM

## 2015-04-09 HISTORY — DX: Vitamin D deficiency, unspecified: E55.9

## 2015-04-09 MED ORDER — OMEPRAZOLE 20 MG PO CPDR
DELAYED_RELEASE_CAPSULE | ORAL | Status: DC
Start: 2015-04-09 — End: 2015-08-04

## 2015-04-09 MED ORDER — VARENICLINE TARTRATE 1 MG PO TABS: 1.0000 mg | ORAL_TABLET | Freq: Two times a day (BID) | ORAL | Status: DC

## 2015-04-09 MED ORDER — ALBUTEROL SULFATE HFA 108 (90 BASE) MCG/ACT IN AERS: 2.0000 | INHALATION_SPRAY | RESPIRATORY_TRACT | Status: DC | PRN

## 2015-04-09 MED ORDER — BUSPIRONE HCL 15 MG PO TABS: ORAL_TABLET | ORAL | Status: DC

## 2015-04-09 MED ORDER — VARENICLINE TARTRATE 0.5 MG X 11 & 1 MG X 42 PO MISC: ORAL | Status: DC

## 2015-04-09 NOTE — Progress Notes (Signed)
BP 110/75 mmHg  Pulse 81  Temp(Src) 98.2 F (36.8 C)  Ht 5' 1.75" (1.568 m)  Wt 216 lb (97.977 kg)  BMI 39.85 kg/m2  SpO2 96%   Subjective:    Patient ID: Susan [name], female    DOB: 02/19/1961, 54 y.o.   MRN: [id number]  HPI: Susan [name] is a 54 y.o. female  Chief Complaint  Patient presents with  . Hypertension  . Nicotine Dependence    she would like to get a rx for Chantix  . Refills    she needs refills on albuterol, omeprazole, and buspar   She is interested in quitting smoking; she had tried earlier, but was too stressed out before She wants to quit smoking; she has stopped drinking, clean and sober for 95 days; going to outpatient rehab program (not AA) three days a week until next Friday and then will go once a week She has been eating a lot of sweets; also has dry mouth and thirst; concerned about diabetes Trouble losing weight; going to change her diet first of the year She has another nodule in her thyroid; they did a biopsy in August, endocrinologist; thinks they said it's overactive; last labs more than 3 months ago Fam hx of thyroid disease (daughter, mother, nephew); has constipation; hair is falling out more too She feels fatigued and revved up at the same time; she is a good eater, not vitamin deficiency  Patient interested in flu shot and also willing to get pneumonia shot today; she cannot recall ever having one and we have no record in [facility]  HM list reviewed; HIV has been done and was negative; 05/09/2010 (estimated date) was done and negative  Relevant past medical, surgical, family and social history reviewed and updated as indicated Past Medical History  Diagnosis Date  . Thyroid disease     hyperthyroid  . 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 Hepatitis C   . Depression   . Anxiety   . Bipolar 1 disorder ([place])   . History of alcohol abuse   . History of hepatitis C   . Hypertension   . History of MRSA infection 2013  . History of  diverticulitis 2013  . H/O suicide attempt     slit wrists  . Osteoporosis   . Arthritis     rheumatoid arthritis  . Sleep apnea     Pt discontinue use of CPAP last year, Dx 3 years ago.   . OSA (obstructive sleep apnea)     managed by Dr. Manuella Ghazi  . Hypothyroidism   . Multinodular thyroid   . Vitamin D deficiency disease   . Vitamin B12 deficiency     Past Surgical History  Procedure Laterality Date  . Salpingoophorectomy Bilateral 11-13-2011  . Breast surgery Left 20 yrs ago  . Cesarean section    . Hernia repair  06/2011, July 2014    Ventral wall repair with Physiomesh  . Bilateral salpingoophorectomy  July 2013    due to abnormal mass   Interim medical history since our last visit reviewed. Allergies and medications reviewed and updated.  Review of Systems Per HPI unless specifically indicated above     Objective:    BP 110/75 mmHg  Pulse 81  Temp(Src) 98.2 F (36.8 C)  Ht 5' 1.75" (1.568 m)  Wt 216 lb (97.977 kg)  BMI 39.85 kg/m2  SpO2 96%  Wt Readings from Last 3 Encounters:  04/09/15 216 lb (97.977 kg)  10/30/14 207  lb (93.895 kg)  10/21/14 207 lb (93.895 kg)    Physical Exam  Constitutional: She appears well-developed and well-nourished. No distress.  Weight up 9 pounds in 5-1/2 months  HENT:  Head: Normocephalic and atraumatic.  Eyes: EOM are normal. No scleral icterus.  Neck: No thyroid mass (nontender, no nodules appreciated on exam) and no thyromegaly present.  Cardiovascular: Normal rate, regular rhythm and normal heart sounds.   No murmur heard. Pulmonary/Chest: Effort normal and breath sounds normal. No respiratory distress. She has no wheezes.  Abdominal: Soft. Bowel sounds are normal. She exhibits no distension.  Musculoskeletal: Normal range of motion. She exhibits no edema.  Neurological: She is alert. She exhibits normal muscle tone.  Skin: Skin is warm and dry. She is not diaphoretic. No pallor.  Psychiatric: She has a normal mood and  affect. Her behavior is normal. Judgment and thought content normal.      Assessment & Plan:   Problem List Items Addressed This Visit      Respiratory   OSA on CPAP   Relevant Orders   CBC with Differential/Platelet (Completed)     Digestive   Hepatitis C    Completed treatment      Vitamin B12 deficiency   Relevant Orders   Vitamin B12 (Completed)     Endocrine   Toxic multinodular goiter    Recheck thyroid labs today, as patient has symptoms suggestive of both underactive or overactive thyroid, so need to clarify      Relevant Orders   TSH (Completed)   T3, free (Completed)   T4, free (Completed)     Genitourinary   Polyuria   Relevant Orders   Hgb A1c w/o eAG (Completed)     Other   Tobacco dependence    Encouraged patient to quit smoking; she will try again with Chantix; starter month followed by 2-3 months of continuing month      Relevant Medications   varenicline (CHANTIX STARTING MONTH PAK) 0.5 MG X 11 & 1 MG X 42 tablet   varenicline (CHANTIX CONTINUING MONTH PAK) 1 MG tablet   Need for Streptococcus pneumoniae and influenza vaccination    Flu vaccine and PPSV-23 today      Relevant Orders   Pneumococcal polysaccharide vaccine 23-valent greater than or equal to 2yo subcutaneous/IM (Completed)   Weight gain, abnormal - Primary    May be somewhat related to her cravings for sweets after becoming sober, but could also be related to her thyroid disease      Vitamin D deficiency   Relevant Orders   VITAMIN D 25 Hydroxy (Vit-D Deficiency, Fractures) (Completed)   Medication monitoring encounter   Relevant Orders   CBC with Differential/Platelet (Completed)   Lipid Panel w/o Chol/HDL Ratio (Completed)   Comprehensive metabolic panel (Completed)   Breast cancer screening   Relevant Orders   MM DIGITAL SCREENING BILATERAL   Alcoholism in recovery [facility])    So happy for patient being clean and sober for 95 days; encouragement given; continue outpatient  rehab program       Other Visit Diagnoses    Needs flu shot        Encounter for immunization           Follow up plan: Return in about 3 months (around 07/08/2015) for complete physical and pap smear.  Meds ordered this encounter  Medications  . varenicline (CHANTIX STARTING MONTH PAK) 0.5 MG X 11 & 1 MG X 42 tablet    Sig:  Take one 0.5 mg tablet by mouth once daily for 3 days, then increase to one 0.5 mg tablet twice daily for 4 days, then increase to one 1 mg tablet twice daily.    Dispense:  53 tablet    Refill:  0  . varenicline (CHANTIX CONTINUING MONTH PAK) 1 MG tablet    Sig: Take 1 tablet (1 mg total) by mouth 2 (two) times daily.    Dispense:  60 tablet    Refill:  3    Fill AFTER the starter pack  . omeprazole (PRILOSEC) 20 MG capsule    Sig: TAKE 1 CAPSULE BY MOUTH DAILY PRN; CAUTION; PROLONGED USE MAY INCREASE RISK OF PNEUMONIA.    Dispense:  30 capsule    Refill:  1    Maximum Refills Reached  . busPIRone (BUSPAR) 15 MG tablet    Sig: TAKE 1 TABLET BY MOUTH IN THE MORNING & TAKE 1 & 1/2 TABLET EACH EVENING    Dispense:  75 tablet    Refill:  6    Maximum Refills Reached  . albuterol (VENTOLIN HFA) 108 (90 BASE) MCG/ACT inhaler    Sig: Inhale 2 puffs into the lungs every 4 (four) hours as needed for wheezing or shortness of breath.    Dispense:  18 g    Refill:  2    Prescription Expired   An after-visit summary was printed and given to the patient at [facility].  Please see the patient instructions which may contain other information and recommendations beyond what is mentioned above in the assessment and plan.

## 2015-04-09 NOTE — Patient Instructions (Addendum)
You received the flu shot today; it should protect you against the flu virus over the coming months; it will take about two weeks for antibodies to develop; do try to stay away from hospitals, nursing homes, and daycares during peak flu season; taking 1000 mg of vitamin C daily during flu season may help you avoid getting sick You also received the PPSV-23 (Pneumovax) today; your next booster will be due after you turn 65 We'll contact you with the lab results Return in 3 months for a physical and pap smear Please do call to schedule your mammogram; the number to schedule one at either [facility] or [facility] is [phone]

## 2015-04-09 NOTE — Assessment & Plan Note (Signed)
Flu vaccine and PPSV-23 today

## 2015-04-09 NOTE — Assessment & Plan Note (Signed)
Completed treatment.

## 2015-04-09 NOTE — Assessment & Plan Note (Signed)
Recheck thyroid labs today, as patient has symptoms suggestive of both underactive or overactive thyroid, so need to clarify

## 2015-04-10 ENCOUNTER — Telehealth: Payer: Self-pay | Admitting: Family Medicine

## 2015-04-10 ENCOUNTER — Other Ambulatory Visit: Payer: Self-pay | Admitting: Family Medicine

## 2015-04-10 DIAGNOSIS — D72820 Lymphocytosis (symptomatic): Secondary | ICD-10-CM

## 2015-04-10 DIAGNOSIS — R7301 Impaired fasting glucose: Secondary | ICD-10-CM

## 2015-04-10 LAB — CBC WITH DIFFERENTIAL/PLATELET
Basophils Absolute: 0.1 10*3/uL (ref 0.0–0.2)
Basos: 1 %
EOS (ABSOLUTE): 0.3 10*3/uL (ref 0.0–0.4)
Eos: 3 %
Hematocrit: 41.2 % (ref 34.0–46.6)
Hemoglobin: 14.1 g/dL (ref 11.1–15.9)
Immature Grans (Abs): 0 10*3/uL (ref 0.0–0.1)
Immature Granulocytes: 0 %
Lymphocytes Absolute: 5.2 10*3/uL — ABNORMAL HIGH (ref 0.7–3.1)
Lymphs: 44 %
MCH: 30.7 pg (ref 26.6–33.0)
MCHC: 34.2 g/dL (ref 31.5–35.7)
MCV: 90 fL (ref 79–97)
Monocytes Absolute: 0.8 10*3/uL (ref 0.1–0.9)
Monocytes: 7 %
Neutrophils Absolute: 5.4 10*3/uL (ref 1.4–7.0)
Neutrophils: 45 %
Platelets: 249 10*3/uL (ref 150–379)
RBC: 4.6 x10E6/uL (ref 3.77–5.28)
RDW: 12.6 % (ref 12.3–15.4)
WBC: 11.7 10*3/uL — ABNORMAL HIGH (ref 3.4–10.8)

## 2015-04-10 LAB — HGB A1C W/O EAG: Hgb A1c MFr Bld: 6 % — ABNORMAL HIGH (ref 4.8–5.6)

## 2015-04-10 LAB — COMPREHENSIVE METABOLIC PANEL
ALT: 19 IU/L (ref 0–32)
AST: 16 IU/L (ref 0–40)
Albumin/Globulin Ratio: 1.7 (ref 1.1–2.5)
Albumin: 4.1 g/dL (ref 3.5–5.5)
Alkaline Phosphatase: 83 IU/L (ref 39–117)
BUN/Creatinine Ratio: 10 (ref 9–23)
BUN: 9 mg/dL (ref 6–24)
Bilirubin Total: 0.4 mg/dL (ref 0.0–1.2)
CO2: 25 mmol/L (ref 18–29)
Calcium: 8.9 mg/dL (ref 8.7–10.2)
Chloride: 98 mmol/L (ref 97–106)
Creatinine, Ser: 0.88 mg/dL (ref 0.57–1.00)
GFR calc Af Amer: 86 mL/min/{1.73_m2} (ref >59)
GFR calc non Af Amer: 75 mL/min/{1.73_m2} (ref >59)
Globulin, Total: 2.4 g/dL (ref 1.5–4.5)
Glucose: 81 mg/dL (ref 65–99)
Potassium: 4 mmol/L (ref 3.5–5.2)
Sodium: 138 mmol/L (ref 136–144)
Total Protein: 6.5 g/dL (ref 6.0–8.5)

## 2015-04-10 LAB — LIPID PANEL W/O CHOL/HDL RATIO
Cholesterol, Total: 181 mg/dL (ref 100–199)
HDL: 36 mg/dL — ABNORMAL LOW (ref >39)
LDL Calculated: 118 mg/dL — ABNORMAL HIGH (ref 0–99)
Triglycerides: 133 mg/dL (ref 0–149)
VLDL Cholesterol Cal: 27 mg/dL (ref 5–40)

## 2015-04-10 LAB — VITAMIN D 25 HYDROXY (VIT D DEFICIENCY, FRACTURES): Vit D, 25-Hydroxy: 11.4 ng/mL — ABNORMAL LOW (ref 30.0–100.0)

## 2015-04-10 LAB — TSH: TSH: 0.03 u[IU]/mL — ABNORMAL LOW (ref 0.450–4.500)

## 2015-04-10 LAB — T4, FREE: Free T4: 1.15 ng/dL (ref 0.82–1.77)

## 2015-04-10 LAB — VITAMIN B12: Vitamin B-12: 318 pg/mL (ref 211–946)

## 2015-04-10 LAB — T3, FREE: T3, Free: 4.8 pg/mL — ABNORMAL HIGH (ref 2.0–4.4)

## 2015-04-10 MED ORDER — VITAMIN D (ERGOCALCIFEROL) 1.25 MG (50000 UNIT) PO CAPS: 50000.0000 [IU] | ORAL_CAPSULE | ORAL | Status: AC

## 2015-04-10 NOTE — Assessment & Plan Note (Signed)
Discussed with patient by phone; lymph count over 5, running another test; will check CBC again in one month (lab visit only)

## 2015-04-10 NOTE — Telephone Encounter (Signed)
Please add on CLL panel to blood in lab -- I spoke to [place], but cannot get test to be ordered in the system, we hand-wrote it on paper

## 2015-04-10 NOTE — Telephone Encounter (Signed)
I told her about the lab results She'll talk to Dr. Gabriel Carina about thyroid tests Start Rx vit D Start OTC vitamin B12 500 or 1000 mcg SL a few days a week PREdiabetes; work on weight loss Lymph count up; doing more tests; return in one month for lab draw only repeat CBC

## 2015-04-10 NOTE — Telephone Encounter (Signed)
[facility] notified to add on. Dr. Sanda Klein, there is still a medication to be approved.

## 2015-04-13 DIAGNOSIS — F1021 Alcohol dependence, in remission: Secondary | ICD-10-CM | POA: Insufficient documentation

## 2015-04-13 NOTE — Assessment & Plan Note (Addendum)
Encouraged patient to quit smoking; she will try again with Chantix; starter month followed by 2-3 months of continuing month

## 2015-04-13 NOTE — Assessment & Plan Note (Signed)
May be somewhat related to her cravings for sweets after becoming sober, but could also be related to her thyroid disease

## 2015-04-13 NOTE — Assessment & Plan Note (Signed)
So happy for patient being clean and sober for 95 days; encouragement given; continue outpatient rehab program

## 2015-04-14 DIAGNOSIS — F4310 Post-traumatic stress disorder, unspecified: Secondary | ICD-10-CM | POA: Diagnosis not present

## 2015-04-15 LAB — FISH HES LEUKEMIA, 4Q12 REA

## 2015-04-15 LAB — SPECIMEN STATUS REPORT

## 2015-04-17 ENCOUNTER — Encounter: Payer: Self-pay | Admitting: Family Medicine

## 2015-04-17 DIAGNOSIS — F17200 Nicotine dependence, unspecified, uncomplicated: Secondary | ICD-10-CM | POA: Diagnosis not present

## 2015-04-17 DIAGNOSIS — E0520 Thyrotoxicosis with toxic multinodular goiter without thyrotoxic crisis or storm: Secondary | ICD-10-CM | POA: Diagnosis not present

## 2015-04-20 ENCOUNTER — Telehealth: Payer: Self-pay | Admitting: Family Medicine

## 2015-04-20 NOTE — Telephone Encounter (Signed)
Labs reviewed; I will call her tomorrow

## 2015-04-22 ENCOUNTER — Other Ambulatory Visit: Payer: Self-pay | Admitting: Internal Medicine

## 2015-04-22 DIAGNOSIS — M5136 Other intervertebral disc degeneration, lumbar region: Secondary | ICD-10-CM | POA: Diagnosis not present

## 2015-04-22 DIAGNOSIS — E0520 Thyrotoxicosis with toxic multinodular goiter without thyrotoxic crisis or storm: Secondary | ICD-10-CM

## 2015-04-22 DIAGNOSIS — M5416 Radiculopathy, lumbar region: Secondary | ICD-10-CM | POA: Diagnosis not present

## 2015-04-22 DIAGNOSIS — E0590 Thyrotoxicosis, unspecified without thyrotoxic crisis or storm: Secondary | ICD-10-CM

## 2015-04-22 NOTE — Telephone Encounter (Signed)
I left brief msg, calling about labs 

## 2015-04-23 NOTE — Telephone Encounter (Signed)
She is going to work with endocrinologist and they are going to do the radiation; scheduled January 3-6, 2017 Her CLL panel was negative; let's recheck CBC in March Discussed risk of leukemia with smoking, so want to keep an eye on this

## 2015-04-29 DIAGNOSIS — F4310 Post-traumatic stress disorder, unspecified: Secondary | ICD-10-CM | POA: Diagnosis not present

## 2015-05-06 ENCOUNTER — Other Ambulatory Visit: Payer: Self-pay | Admitting: Family Medicine

## 2015-05-06 NOTE — Telephone Encounter (Signed)
BP and pulse from Dec 2016 visit reviewed; Rx approved

## 2015-05-11 DIAGNOSIS — J019 Acute sinusitis, unspecified: Secondary | ICD-10-CM | POA: Diagnosis not present

## 2015-05-11 DIAGNOSIS — J029 Acute pharyngitis, unspecified: Secondary | ICD-10-CM | POA: Diagnosis not present

## 2015-05-12 ENCOUNTER — Ambulatory Visit: Admission: RE | Admit: 2015-05-12 | Payer: Medicare Other | Source: Ambulatory Visit

## 2015-05-12 ENCOUNTER — Other Ambulatory Visit: Payer: Medicare Other

## 2015-05-13 ENCOUNTER — Other Ambulatory Visit: Payer: Medicare Other

## 2015-05-13 ENCOUNTER — Encounter
Admission: RE | Admit: 2015-05-13 | Discharge: 2015-05-13 | Disposition: A | Discharge status: Home / Self Care | Payer: Medicare Other | Source: Ambulatory Visit | Attending: Internal Medicine | Admitting: Internal Medicine

## 2015-05-13 DIAGNOSIS — E0520 Thyrotoxicosis with toxic multinodular goiter without thyrotoxic crisis or storm: Secondary | ICD-10-CM | POA: Insufficient documentation

## 2015-05-13 MED ORDER — SODIUM IODIDE I-123 7.4 MBQ PO CAPS
148.6000 | ORAL_CAPSULE | Freq: Once | ORAL | Status: AC
  Administered 2015-05-13: 148.62 via ORAL

## 2015-05-14 ENCOUNTER — Encounter
Admission: RE | Admit: 2015-05-14 | Discharge: 2015-05-14 | Disposition: A | Discharge status: Home / Self Care | Payer: Medicare Other | Source: Ambulatory Visit | Attending: Internal Medicine | Admitting: Internal Medicine

## 2015-05-14 DIAGNOSIS — E0520 Thyrotoxicosis with toxic multinodular goiter without thyrotoxic crisis or storm: Secondary | ICD-10-CM | POA: Diagnosis not present

## 2015-05-14 DIAGNOSIS — E042 Nontoxic multinodular goiter: Secondary | ICD-10-CM | POA: Diagnosis not present

## 2015-05-15 ENCOUNTER — Encounter
Admission: RE | Admit: 2015-05-15 | Discharge: 2015-05-15 | Disposition: A | Discharge status: Home / Self Care | Payer: Medicare Other | Source: Ambulatory Visit | Attending: Internal Medicine | Admitting: Internal Medicine

## 2015-05-15 DIAGNOSIS — E0520 Thyrotoxicosis with toxic multinodular goiter without thyrotoxic crisis or storm: Secondary | ICD-10-CM

## 2015-05-15 DIAGNOSIS — E0590 Thyrotoxicosis, unspecified without thyrotoxic crisis or storm: Secondary | ICD-10-CM

## 2015-05-15 MED ORDER — SODIUM IODIDE I 131 CAPSULE
30.0500 | Freq: Once | INTRAVENOUS | Status: AC | PRN
  Administered 2015-05-15: 30.05 via ORAL

## 2015-05-26 DIAGNOSIS — M5416 Radiculopathy, lumbar region: Secondary | ICD-10-CM | POA: Diagnosis not present

## 2015-05-26 DIAGNOSIS — M5136 Other intervertebral disc degeneration, lumbar region: Secondary | ICD-10-CM | POA: Diagnosis not present

## 2015-06-03 ENCOUNTER — Other Ambulatory Visit: Payer: Self-pay | Admitting: Family Medicine

## 2015-06-12 ENCOUNTER — Other Ambulatory Visit: Payer: Self-pay | Admitting: Physical Medicine and Rehabilitation

## 2015-06-12 DIAGNOSIS — M5416 Radiculopathy, lumbar region: Secondary | ICD-10-CM

## 2015-06-15 DIAGNOSIS — G4733 Obstructive sleep apnea (adult) (pediatric): Secondary | ICD-10-CM | POA: Diagnosis not present

## 2015-07-01 ENCOUNTER — Ambulatory Visit: Admission: RE | Admit: 2015-07-01 | Payer: Medicare Other | Source: Ambulatory Visit

## 2015-07-03 DIAGNOSIS — M7051 Other bursitis of knee, right knee: Secondary | ICD-10-CM | POA: Diagnosis not present

## 2015-07-06 DIAGNOSIS — E0520 Thyrotoxicosis with toxic multinodular goiter without thyrotoxic crisis or storm: Secondary | ICD-10-CM | POA: Diagnosis not present

## 2015-07-08 ENCOUNTER — Ambulatory Visit (INDEPENDENT_AMBULATORY_CARE_PROVIDER_SITE_OTHER): Payer: Medicare Other | Admitting: Family Medicine

## 2015-07-08 ENCOUNTER — Encounter: Payer: Self-pay | Admitting: Family Medicine

## 2015-07-08 VITALS — BP 154/89 | HR 65 | Temp 97.6°F | Ht 62.0 in | Wt 213.0 lb

## 2015-07-08 DIAGNOSIS — F1020 Alcohol dependence, uncomplicated: Secondary | ICD-10-CM

## 2015-07-08 DIAGNOSIS — F17200 Nicotine dependence, unspecified, uncomplicated: Secondary | ICD-10-CM | POA: Diagnosis not present

## 2015-07-08 DIAGNOSIS — Z1231 Encounter for screening mammogram for malignant neoplasm of breast: Secondary | ICD-10-CM | POA: Diagnosis not present

## 2015-07-08 DIAGNOSIS — R7301 Impaired fasting glucose: Secondary | ICD-10-CM | POA: Diagnosis not present

## 2015-07-08 DIAGNOSIS — E0520 Thyrotoxicosis with toxic multinodular goiter without thyrotoxic crisis or storm: Secondary | ICD-10-CM

## 2015-07-08 DIAGNOSIS — Z780 Asymptomatic menopausal state: Secondary | ICD-10-CM

## 2015-07-08 DIAGNOSIS — Z1239 Encounter for other screening for malignant neoplasm of breast: Secondary | ICD-10-CM | POA: Diagnosis not present

## 2015-07-08 DIAGNOSIS — I10 Essential (primary) hypertension: Secondary | ICD-10-CM | POA: Diagnosis not present

## 2015-07-08 DIAGNOSIS — Z124 Encounter for screening for malignant neoplasm of cervix: Secondary | ICD-10-CM

## 2015-07-08 DIAGNOSIS — F1021 Alcohol dependence, in remission: Secondary | ICD-10-CM

## 2015-07-08 DIAGNOSIS — Z0000 Encounter for general adult medical examination without abnormal findings: Secondary | ICD-10-CM

## 2015-07-08 MED ORDER — AMLODIPINE BESYLATE 10 MG PO TABS: 10.0000 mg | ORAL_TABLET | Freq: Every day | ORAL | Status: DC

## 2015-07-08 NOTE — Assessment & Plan Note (Signed)
Weight loss will be key; encouraged her to tell her endo of her dx; check A1c in June

## 2015-07-08 NOTE — Assessment & Plan Note (Signed)
USPSTF grade A and B recommendations reviewed with patient; age-appropriate recommendations, preventive care, screening tests, etc discussed and encouraged; healthy living encouraged; see AVS for patient education given to patient  

## 2015-07-08 NOTE — Patient Instructions (Addendum)
In the future, if you must take steroids, pay close attention to your mental health and seek help immediately for any dark thoughts Please do call to schedule your mammogram and bone density test; the number to schedule one at either [facility] or [facility] is [phone]   Health Maintenance, Female Adopting a healthy lifestyle and getting preventive care can go a long way to promote health and wellness. Talk with your health care provider about what schedule of regular examinations is right for you. This is a good chance for you to check in with your provider about disease prevention and staying healthy. In between checkups, there are plenty of things you can do on your own. Experts have done a lot of research about which lifestyle changes and preventive measures are most likely to keep you healthy. Ask your health care provider for more information. WEIGHT AND DIET  Eat a healthy diet  Be sure to include plenty of vegetables, fruits, low-fat dairy products, and lean protein.  Do not eat a lot of foods high in solid fats, added sugars, or salt.  Get regular exercise. This is one of the most important things you can do for your health.  Most adults should exercise for at least 150 minutes each week. The exercise should increase your heart rate and make you sweat (moderate-intensity exercise).  Most adults should also do strengthening exercises at least twice a week. This is in addition to the moderate-intensity exercise.  Maintain a healthy weight  Body mass index (BMI) is a measurement that can be used to identify possible weight problems. It estimates body fat based on height and weight. Your health care provider can help determine your BMI and help you achieve or maintain a healthy weight.  For females 70 years of age and older:   A BMI below 18.5 is considered underweight.  A BMI of 18.5 to 24.9 is normal.  A BMI of 25 to 29.9 is considered  overweight.  A BMI of 30 and above is considered obese.  Watch levels of cholesterol and blood lipids  You should start having your blood tested for lipids and cholesterol at 55 years of age, then have this test every 5 years.  You may need to have your cholesterol levels checked more often if:  Your lipid or cholesterol levels are high.  You are older than 55 years of age.  You are at high risk for heart disease.  CANCER SCREENING   Lung Cancer  Lung cancer screening is recommended for adults 41-30 years old who are at high risk for lung cancer because of a history of smoking.  A yearly low-dose CT scan of the lungs is recommended for people who:  Currently smoke.  Have quit within the past 15 years.  Have at least a 30-pack-year history of smoking. A pack year is smoking an average of one pack of cigarettes a day for 1 year.  Yearly screening should continue until it has been 15 years since you quit.  Yearly screening should stop if you develop a health problem that would prevent you from having lung cancer treatment.  Breast Cancer  Practice breast self-awareness. This means understanding how your breasts normally appear and feel.  It also means doing regular breast self-exams. Let your health care provider know about any changes, no matter how small.  If you are in your 20s or 30s, you should have a clinical breast exam (CBE) by a health care provider  every 1-3 years as part of a regular health exam.  If you are 69 or older, have a CBE every year. Also consider having a breast X-ray (mammogram) every year.  If you have a family history of breast cancer, talk to your health care provider about genetic screening.  If you are at high risk for breast cancer, talk to your health care provider about having an MRI and a mammogram every year.  Breast cancer gene (BRCA) assessment is recommended for women who have family members with BRCA-related cancers. BRCA-related  cancers include:  Breast.  Ovarian.  Tubal.  Peritoneal cancers.  Results of the assessment will determine the need for genetic counseling and BRCA1 and BRCA2 testing. Cervical Cancer Your health care provider may recommend that you be screened regularly for cancer of the pelvic organs (ovaries, uterus, and vagina). This screening involves a pelvic examination, including checking for microscopic changes to the surface of your cervix (Pap test). You may be encouraged to have this screening done every 3 years, beginning at age 82.  For women ages 18-65, health care providers may recommend pelvic exams and Pap testing every 3 years, or they may recommend the Pap and pelvic exam, combined with testing for human papilloma virus (HPV), every 5 years. Some types of HPV increase your risk of cervical cancer. Testing for HPV may also be done on women of any age with unclear Pap test results.  Other health care providers may not recommend any screening for nonpregnant women who are considered low risk for pelvic cancer and who do not have symptoms. Ask your health care provider if a screening pelvic exam is right for you.  If you have had past treatment for cervical cancer or a condition that could lead to cancer, you need Pap tests and screening for cancer for at least 20 years after your treatment. If Pap tests have been discontinued, your risk factors (such as having a new sexual partner) need to be reassessed to determine if screening should resume. Some women have medical problems that increase the chance of getting cervical cancer. In these cases, your health care provider may recommend more frequent screening and Pap tests. Colorectal Cancer  This type of cancer can be detected and often prevented.  Routine colorectal cancer screening usually begins at 55 years of age and continues through 55 years of age.  Your health care provider may recommend screening at an earlier age if you have risk  factors for colon cancer.  Your health care provider may also recommend using home test kits to check for hidden blood in the stool.  A small camera at the end of a tube can be used to examine your colon directly (sigmoidoscopy or colonoscopy). This is done to check for the earliest forms of colorectal cancer.  Routine screening usually begins at age 9.  Direct examination of the colon should be repeated every 5-10 years through 55 years of age. However, you may need to be screened more often if early forms of precancerous polyps or small growths are found. Skin Cancer  Check your skin from head to toe regularly.  Tell your health care provider about any new moles or changes in moles, especially if there is a change in a mole's shape or color.  Also tell your health care provider if you have a mole that is larger than the size of a pencil eraser.  Always use sunscreen. Apply sunscreen liberally and repeatedly throughout the day.  Protect yourself by  wearing long sleeves, pants, a wide-brimmed hat, and sunglasses whenever you are outside. HEART DISEASE, DIABETES, AND HIGH BLOOD PRESSURE   High blood pressure causes heart disease and increases the risk of stroke. High blood pressure is more likely to develop in:  People who have blood pressure in the high end of the normal range (130-139/85-89 mm Hg).  People who are overweight or obese.  People who are African American.  If you are 35-33 years of age, have your blood pressure checked every 3-5 years. If you are 13 years of age or older, have your blood pressure checked every year. You should have your blood pressure measured twice--once when you are at a hospital or clinic, and once when you are not at a hospital or clinic. Record the average of the two measurements. To check your blood pressure when you are not at a hospital or clinic, you can use:  An automated blood pressure machine at a pharmacy.  A home blood pressure  monitor.  If you are between 47 years and 16 years old, ask your health care provider if you should take aspirin to prevent strokes.  Have regular diabetes screenings. This involves taking a blood sample to check your fasting blood sugar level.  If you are at a normal weight and have a low risk for diabetes, have this test once every three years after 55 years of age.  If you are overweight and have a high risk for diabetes, consider being tested at a younger age or more often. PREVENTING INFECTION  Hepatitis B  If you have a higher risk for hepatitis B, you should be screened for this virus. You are considered at high risk for hepatitis B if:  You were born in a country where hepatitis B is common. Ask your health care provider which countries are considered high risk.  Your parents were born in a high-risk country, and you have not been immunized against hepatitis B (hepatitis B vaccine).  You have HIV or AIDS.  You use needles to inject street drugs.  You live with someone who has hepatitis B.  You have had sex with someone who has hepatitis B.  You get hemodialysis treatment.  You take certain medicines for conditions, including cancer, organ transplantation, and autoimmune conditions. Hepatitis C  Blood testing is recommended for:  Everyone born from [birth year] through 1965.  Anyone with known risk factors for hepatitis C. Sexually transmitted infections (STIs)  You should be screened for sexually transmitted infections (STIs) including gonorrhea and chlamydia if:  You are sexually active and are younger than 55 years of age.  You are older than 55 years of age and your health care provider tells you that you are at risk for this type of infection.  Your sexual activity has changed since you were last screened and you are at an increased risk for chlamydia or gonorrhea. Ask your health care provider if you are at risk.  If you do not have HIV, but are at risk, it may be  recommended that you take a prescription medicine daily to prevent HIV infection. This is called pre-exposure prophylaxis (PrEP). You are considered at risk if:  You are sexually active and do not regularly use condoms or know the HIV status of your partner(s).  You take drugs by injection.  You are sexually active with a partner who has HIV. Talk with your health care provider about whether you are at high risk of being infected with HIV. If  you choose to begin PrEP, you should first be tested for HIV. You should then be tested every 3 months for as long as you are taking PrEP.  PREGNANCY   If you are premenopausal and you may become pregnant, ask your health care provider about preconception counseling.  If you may become pregnant, take 400 to 800 micrograms (mcg) of folic acid every day.  If you want to prevent pregnancy, talk to your health care provider about birth control (contraception). OSTEOPOROSIS AND MENOPAUSE   Osteoporosis is a disease in which the bones lose minerals and strength with aging. This can result in serious bone fractures. Your risk for osteoporosis can be identified using a bone density scan.  If you are 81 years of age or older, or if you are at risk for osteoporosis and fractures, ask your health care provider if you should be screened.  Ask your health care provider whether you should take a calcium or vitamin D supplement to lower your risk for osteoporosis.  Menopause may have certain physical symptoms and risks.  Hormone replacement therapy may reduce some of these symptoms and risks. Talk to your health care provider about whether hormone replacement therapy is right for you.  HOME CARE INSTRUCTIONS   Schedule regular health, dental, and eye exams.  Stay current with your immunizations.   Do not use any tobacco products including cigarettes, chewing tobacco, or electronic cigarettes.  If you are pregnant, do not drink alcohol.  If you are  breastfeeding, limit how much and how often you drink alcohol.  Limit alcohol intake to no more than 1 drink per day for nonpregnant women. One drink equals 12 ounces of beer, 5 ounces of wine, or 1 ounces of hard liquor.  Do not use street drugs.  Do not share needles.  Ask your health care provider for help if you need support or information about quitting drugs.  Tell your health care provider if you often feel depressed.  Tell your health care provider if you have ever been abused or do not feel safe at home.   This information is not intended to replace advice given to you by your health care provider. Make sure you discuss any questions you have with your health care provider.   Document Released: 11/08/2010 Document Revised: 05/16/2014 Document Reviewed: 03/27/2013 Elsevier Interactive Patient Education [employer].

## 2015-07-08 NOTE — Assessment & Plan Note (Signed)
Order bone density study; menopausal for 10 years; smoker

## 2015-07-08 NOTE — Assessment & Plan Note (Signed)
So glad that she didn't drink with her recent stress

## 2015-07-08 NOTE — Assessment & Plan Note (Signed)
Explained that we'll start doing chest CTs yearly at age 55 if she meets criteria (30 pack year smoking hx)

## 2015-07-08 NOTE — Assessment & Plan Note (Signed)
She sees endocrinologist later today

## 2015-07-08 NOTE — Progress Notes (Addendum)
Patient ID: Susan [name], female   DOB: [birth year], 55 y.o.   MRN: [id number]   Subjective:   Susan [name] is a 55 y.o. female here for a complete physical exam  Interim issues since last visit: she has been on steroids quite a bit for knees; through physiatry, Dr. Sharlet Salina; she goes back to see him on the 15th or 16th and they are doing another MRI; injections are not working; went to [place] urgent care and that was the last round of steroids  USPSTF grade A and B recommendations Alcohol: did not drink through the stress Depression:  Depression screen [facility] 2/9 07/08/2015  Decreased Interest 0  Down, Depressed, Hopeless 3  PHQ - 2 Score 3  Altered sleeping 3  Tired, decreased energy 0  Change in appetite 3  Feeling bad or failure about yourself  0  Trouble concentrating 0  Moving slowly or fidgety/restless 0  Suicidal thoughts 1  PHQ-9 Score 10  Difficult doing work/chores Somewhat difficult  Sunday was terrible; she is aware of what's going on; aware of the steroids doing this, could not stand her own self on Sunday, it's the steroids and she's off the steroids today  Hypertension: high today; teeters when she checks it at home; okay with increasing amlodipine Obesity: asked Contrave Tobacco use: she has been smoking a little over 1/2 ppd; was down to less than 1/2 ppd before her husband's death; doing Chantix HIV, hep B, hep C: declined testing STD testing and prevention (chl/gon/syphilis): declined Lipids: Dec 2016; due June 2017 Glucose: Dec 2016; due June 2017 Colorectal cancer: 2014; next due 2019 Breast cancer: mammogram ordered today BRCA gene screening: n/a Intimate partner violence: n/a Cervical cancer screening: pap today; hx of abnormal many years ago Lung cancer: yearly chest CTs at age 64 if 30 pack history Osteoporosis: order today; menopause early to mid 57's Fall prevention/vitamin D: discussed Aspirin: n/a Diet: eating some ice cream Exercise: she  is exercising Skin cancer: no worrisome moles; sees derm   Past Medical History  Diagnosis Date  . Thyroid disease     hyperthyroid  . 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 Hepatitis C   . Depression   . Anxiety   . Bipolar 1 disorder ([place])   . History of alcohol abuse   . History of hepatitis C   . Hypertension   . History of MRSA infection 2013  . History of diverticulitis 2013  . H/O suicide attempt     slit wrists  . Osteoporosis   . Arthritis     rheumatoid arthritis  . Sleep apnea     Pt discontinue use of CPAP last year, Dx 3 years ago.   . OSA (obstructive sleep apnea)     managed by Dr. Manuella Ghazi  . Hypothyroidism   . Multinodular thyroid   . Vitamin D deficiency disease   . Vitamin B12 deficiency    Past Surgical History  Procedure Laterality Date  . Salpingoophorectomy Bilateral 11-13-2011  . Breast surgery Left 20 yrs ago  . Cesarean section    . Hernia repair  06/2011, July 2014    Ventral wall repair with Physiomesh  . Bilateral salpingoophorectomy  July 2013    due to abnormal mass   Family History  Problem Relation Age of Onset  . Alcohol abuse Father   . Arthritis Mother   . Asthma Mother   . Mental illness Mother   . Thyroid disease Mother   . COPD Mother   .  Heart disease Mother   . Congestive Heart Failure Mother   . Arthritis Brother   . Mental illness Brother   . Cancer Brother     non-hodkins lymphoma  . Alcohol abuse Sister   . Drug abuse Sister   . Mental illness Sister   . Diabetes Neg Hx   . Stroke Neg Hx    Social History  Substance Use Topics  . Smoking status: Current Every Day Smoker -- 0.50 packs/day for 30 years    Types: Cigarettes  . Smokeless tobacco: Never Used  . Alcohol Use: No   Review of Systems  Objective:   Filed Vitals:   07/08/15 0804  BP: 154/89  Pulse: 65  Temp: 97.6 F (36.4 C)  Height: _0  (1.575 m)  Weight: 213 lb (96.616 kg)  SpO2: 100%   Body mass index is 38.95 kg/(m^2). Wt Readings from Last 3  Encounters:  07/08/15 213 lb (96.616 kg)  04/09/15 216 lb (97.977 kg)  10/30/14 207 lb (93.895 kg)   Physical Exam  Constitutional: She appears well-developed and well-nourished.  HENT:  Head: Normocephalic and atraumatic.  Eyes: Conjunctivae and EOM are normal. Right eye exhibits no hordeolum. Left eye exhibits no hordeolum. No scleral icterus.  Neck: Carotid bruit is not present. No thyromegaly present.  Cardiovascular: Normal rate, regular rhythm, S1 normal, S2 normal and normal heart sounds.   No extrasystoles are present.  Pulmonary/Chest: Effort normal and breath sounds normal. No respiratory distress. Right breast exhibits no inverted nipple, no mass, no nipple discharge, no skin change and no tenderness. Left breast exhibits no inverted nipple, no mass, no nipple discharge, no skin change and no tenderness. Breasts are symmetrical.  Abdominal: Soft. Normal appearance and bowel sounds are normal. She exhibits no distension, no abdominal bruit, no pulsatile midline mass and no mass. There is no hepatosplenomegaly. There is no tenderness. No hernia.  Genitourinary: Uterus normal. Pelvic exam was performed with patient prone. There is no rash or lesion on the right labia. There is no rash or lesion on the left labia. Cervix exhibits no motion tenderness and no discharge. Right adnexum displays no mass, no tenderness and no fullness. Left adnexum displays no mass, no tenderness and no fullness. No erythema in the vagina. No vaginal discharge found.  Musculoskeletal: Normal range of motion. She exhibits no edema.  Lymphadenopathy:       Head (right side): No submandibular adenopathy present.       Head (left side): No submandibular adenopathy present.    She has no cervical adenopathy.    She has no axillary adenopathy.  Neurological: She is alert. She displays no tremor. No cranial nerve deficit. She exhibits normal muscle tone. Gait normal.  Skin: Skin is warm and dry. No bruising and no  ecchymosis noted. No cyanosis. No pallor.  Psychiatric: Her speech is normal and behavior is normal. Thought content normal. Her mood appears not anxious. She does not exhibit a depressed mood.    Assessment/Plan:   Problem List Items Addressed This Visit      Cardiovascular and Mediastinum   Hypertension    Not controlled; continue beta-blocker at same dose, pulse 65; continue ARB (max); increase amlodipine to 10 mg daily; recheck BP here in 2-3 weeks; DASH guidelines; weight loss encouraged      Relevant Medications   amLODipine (NORVASC) 10 MG tablet     Endocrine   Toxic multinodular goiter    She sees endocrinologist later today  Impaired fasting glucose    Weight loss will be key; encouraged her to tell her endo of her dx; check A1c in June        Other   Tobacco dependence    Explained that we'll start doing chest CTs yearly at age 29 if she meets criteria (30 pack year smoking hx)      Breast cancer screening    CBE done today; no concerns; SBE encouraged to be done monthly; mammo every 1-2 years      Alcoholism in recovery [facility])    So glad that she didn't drink with her recent stress      Postmenopausal    Order bone density study; menopausal for 10 years; smoker      Relevant Orders   DG Bone Density   Preventative health care - Primary    USPSTF grade A and B recommendations reviewed with patient; age-appropriate recommendations, preventive care, screening tests, etc discussed and encouraged; healthy living encouraged; see AVS for patient education given to patient      Cervical cancer screening   Relevant Orders   Pap liquid-based and HPV (high risk)    Other Visit Diagnoses    Visit for screening mammogram        Relevant Orders    MM SCREENING BREAST TOMO BILATERAL        Meds ordered this encounter  Medications  . cyclobenzaprine (FLEXERIL) 5 MG tablet    Sig: Take 5 mg by mouth.  Marland Kitchen amLODipine (NORVASC) 10 MG tablet    Sig: Take 1  tablet (10 mg total) by mouth daily.    Dispense:  30 tablet    Refill:  5    Cancel the 5 mg strength; I'm increasing the dose   Orders Placed This Encounter  Procedures  . MM SCREENING BREAST TOMO BILATERAL    Standing Status: Future     Number of Occurrences:      Standing Expiration Date: 09/06/2016    Order Specific Question:  Reason for Exam (SYMPTOM  OR DIAGNOSIS REQUIRED)    Answer:  yearly screening    Order Specific Question:  Is the patient pregnant?    Answer:  No    Order Specific Question:  Preferred imaging location?    Answer:  [facility]  . DG Bone Density    Order Specific Question:  Reason for Exam (SYMPTOM  OR DIAGNOSIS REQUIRED)    Answer:  post-menopausal x 10 years; hx of steroid use    Order Specific Question:  Preferred imaging location?    Answer:  [facility]    Order Specific Question:  Is the patient pregnant?    Answer:  No    Follow up plan: Return 2-3 weeks, for obesity; 12+ months for next CPE.  An after-visit summary was printed and given to the patient at [facility].  Please see the patient instructions which may contain other information and recommendations beyond what is mentioned above in the assessment and plan.

## 2015-07-08 NOTE — Addendum Note (Signed)
Addended by: LADA, MELINDA P on: 07/08/2015 11:30 AM   Modules accepted: Orders, SmartSet

## 2015-07-08 NOTE — Assessment & Plan Note (Signed)
CBE done today; no concerns; SBE encouraged to be done monthly; mammo every 1-2 years

## 2015-07-08 NOTE — Assessment & Plan Note (Signed)
Not controlled; continue beta-blocker at same dose, pulse 65; continue ARB (max); increase amlodipine to 10 mg daily; recheck BP here in 2-3 weeks; DASH guidelines; weight loss encouraged

## 2015-07-13 LAB — PAP LB AND HPV HIGH-RISK
HPV, high-risk: NEGATIVE
PAP Smear Comment: 0

## 2015-07-14 ENCOUNTER — Telehealth: Payer: Self-pay | Admitting: Family Medicine

## 2015-07-14 NOTE — Telephone Encounter (Signed)
Calling about pap smear, left brief msg; will call back later ----------------------------- Amy, please just let her know that her pap smear was completely normal; great news I don't have any note about previous abnormal pap smears; please ask if she remembers if she has had any abnormals If she has had three normal pap smears in a row, then she can have her next pap smear in 3 to 5 years If she has a history of abnormal pap smears recently, then back to me please

## 2015-07-15 NOTE — Telephone Encounter (Signed)
Spoke with patient, she states that she has had an abnormal one, but it was a long, long time ago. She states she's had more than 3 normal paps since then.

## 2015-07-22 DIAGNOSIS — F4310 Post-traumatic stress disorder, unspecified: Secondary | ICD-10-CM | POA: Diagnosis not present

## 2015-07-23 ENCOUNTER — Ambulatory Visit
Admission: RE | Admit: 2015-07-23 | Discharge: 2015-07-23 | Disposition: A | Discharge status: Home / Self Care | Payer: Medicare Other | Source: Ambulatory Visit | Attending: Physical Medicine and Rehabilitation | Admitting: Physical Medicine and Rehabilitation

## 2015-07-23 DIAGNOSIS — M5416 Radiculopathy, lumbar region: Secondary | ICD-10-CM | POA: Insufficient documentation

## 2015-07-23 DIAGNOSIS — M4806 Spinal stenosis, lumbar region: Secondary | ICD-10-CM | POA: Diagnosis not present

## 2015-07-23 DIAGNOSIS — M5186 Other intervertebral disc disorders, lumbar region: Secondary | ICD-10-CM | POA: Insufficient documentation

## 2015-07-23 DIAGNOSIS — M5126 Other intervertebral disc displacement, lumbar region: Secondary | ICD-10-CM | POA: Diagnosis not present

## 2015-07-23 DIAGNOSIS — G544 Lumbosacral root disorders, not elsewhere classified: Secondary | ICD-10-CM | POA: Diagnosis not present

## 2015-07-23 DIAGNOSIS — M5136 Other intervertebral disc degeneration, lumbar region: Secondary | ICD-10-CM | POA: Diagnosis not present

## 2015-07-29 ENCOUNTER — Ambulatory Visit: Payer: Medicare Other | Admitting: Family Medicine

## 2015-07-30 ENCOUNTER — Emergency Department: Payer: Medicare Other

## 2015-07-30 ENCOUNTER — Emergency Department
Admission: EM | Admit: 2015-07-30 | Discharge: 2015-07-30 | Disposition: A | Discharge status: Home / Self Care | Payer: Medicare Other | Attending: Emergency Medicine | Admitting: Emergency Medicine

## 2015-07-30 DIAGNOSIS — M1711 Unilateral primary osteoarthritis, right knee: Secondary | ICD-10-CM

## 2015-07-30 DIAGNOSIS — F17210 Nicotine dependence, cigarettes, uncomplicated: Secondary | ICD-10-CM | POA: Diagnosis not present

## 2015-07-30 DIAGNOSIS — M25561 Pain in right knee: Secondary | ICD-10-CM | POA: Diagnosis present

## 2015-07-30 DIAGNOSIS — Z79899 Other long term (current) drug therapy: Secondary | ICD-10-CM | POA: Diagnosis not present

## 2015-07-30 DIAGNOSIS — I10 Essential (primary) hypertension: Secondary | ICD-10-CM | POA: Insufficient documentation

## 2015-07-30 DIAGNOSIS — M179 Osteoarthritis of knee, unspecified: Secondary | ICD-10-CM | POA: Diagnosis not present

## 2015-07-30 MED ORDER — MELOXICAM 15 MG PO TABS: 15.0000 mg | ORAL_TABLET | Freq: Every day | ORAL | Status: DC

## 2015-07-30 MED ORDER — OXYCODONE-ACETAMINOPHEN 5-325 MG PO TABS: 1.0000 | ORAL_TABLET | ORAL | Status: DC | PRN

## 2015-07-30 NOTE — ED Provider Notes (Signed)
[facility] Emergency Department Provider Note  ____________________________________________  Time seen: Approximately 12:18 PM  I have reviewed the triage vital signs and the nursing notes.   HISTORY  Chief Complaint Knee Pain    HPI Susan [name] is a 55 y.o. female presents for evaluation of pain to her right knee for about 3 weeks. Unsure of mechanism of injury. Patient states that she see her PCP who has put her on a prednisone with initially helped but pain continues. Due to the pain is located more medially and has a difficult time walking which exacerbates the pain.   Past Medical History  Diagnosis Date  . Thyroid disease     hyperthyroid  . 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 Hepatitis C   . Depression   . Anxiety   . Bipolar 1 disorder ([place])   . History of alcohol abuse   . History of hepatitis C   . Hypertension   . History of MRSA infection 2013  . History of diverticulitis 2013  . H/O suicide attempt     slit wrists  . Osteoporosis   . Arthritis     rheumatoid arthritis  . Sleep apnea     Pt discontinue use of CPAP last year, Dx 3 years ago.   . OSA (obstructive sleep apnea)     managed by Dr. Manuella Ghazi  . Hypothyroidism   . Multinodular thyroid   . Vitamin D deficiency disease   . Vitamin B12 deficiency     Patient Active Problem List   Diagnosis Date Noted  . Postmenopausal 07/08/2015  . Preventative health care 07/08/2015  . Cervical cancer screening 07/08/2015  . Alcoholism in recovery ([place]) 04/13/2015  . Lymphocytosis 04/10/2015  . Impaired fasting glucose 04/10/2015  . Need for Streptococcus pneumoniae and influenza vaccination 04/09/2015  . Polyuria 04/09/2015  . Vitamin D deficiency 04/09/2015  . Vitamin B12 deficiency 04/09/2015  . Medication monitoring encounter 04/09/2015  . OSA on CPAP 04/09/2015  . Breast cancer screening 04/09/2015  . Subclinical hyperthyroidism 06/26/2014  . Toxic multinodular goiter 06/26/2014   . Hypertension 06/26/2014  . Hepatitis C 06/26/2014  . Tobacco dependence 06/26/2014  . Bipolar disorder ([place]) 06/26/2014  . Recurrent ventral hernia 11/08/2012    Past Surgical History  Procedure Laterality Date  . Salpingoophorectomy Bilateral 11-13-2011  . Breast surgery Left 20 yrs ago  . Cesarean section    . Hernia repair  06/2011, July 2014    Ventral wall repair with Physiomesh  . Bilateral salpingoophorectomy  July 2013    due to abnormal mass    Current Outpatient Rx  Name  Route  Sig  Dispense  Refill  . albuterol (VENTOLIN HFA) 108 (90 BASE) MCG/ACT inhaler   Inhalation   Inhale 2 puffs into the lungs every 4 (four) hours as needed for wheezing or shortness of breath.   18 g   2     Prescription Expired   . amLODipine (NORVASC) 10 MG tablet   Oral   Take 1 tablet (10 mg total) by mouth daily.   30 tablet   5     Cancel the 5 mg strength; I'm increasing the dose   . atenolol (TENORMIN) 25 MG tablet      TAKE 1 TABLET BY MOUTH DAILY.   30 tablet   11     Maximum Refills Reached   . busPIRone (BUSPAR) 15 MG tablet      TAKE 1 TABLET BY MOUTH IN  THE MORNING & TAKE 1 & 1/2 TABLET EACH EVENING   75 tablet   6     Maximum Refills Reached   . clonazePAM (KLONOPIN) 0.5 MG tablet   Oral   Take 0.5 mg by mouth 3 (three) times daily as needed for anxiety.         . cyclobenzaprine (FLEXERIL) 5 MG tablet   Oral   Take 5 mg by mouth.         . DULoxetine (CYMBALTA) 30 MG capsule   Oral   Take 60 mg by mouth daily.          Marland Kitchen gabapentin (NEURONTIN) 300 MG capsule   Oral   Take 600 mg by mouth 2 (two) times daily.          Marland Kitchen losartan (COZAAR) 100 MG tablet      TAKE 1 TABLET BY MOUTH DAILY.   30 tablet   6     Maximum Refills Reached   . meloxicam (MOBIC) 15 MG tablet   Oral   Take 1 tablet (15 mg total) by mouth daily.   30 tablet   0   . omeprazole (PRILOSEC) 20 MG capsule      TAKE 1 CAPSULE BY MOUTH DAILY PRN; CAUTION;  PROLONGED USE MAY INCREASE RISK OF PNEUMONIA.   30 capsule   1     Maximum Refills Reached   . oxyCODONE-acetaminophen (ROXICET) 5-325 MG tablet   Oral   Take 1-2 tablets by mouth every 4 (four) hours as needed for severe pain.   15 tablet   0   . varenicline (CHANTIX CONTINUING MONTH PAK) 1 MG tablet   Oral   Take 1 tablet (1 mg total) by mouth 2 (two) times daily.   60 tablet   3     Fill AFTER the starter pack     Allergies Hydrochlorothiazide and Lasix  Family History  Problem Relation Age of Onset  . Alcohol abuse Father   . Arthritis Mother   . Asthma Mother   . Mental illness Mother   . Thyroid disease Mother   . COPD Mother   . Heart disease Mother   . Congestive Heart Failure Mother   . Arthritis Brother   . Mental illness Brother   . Cancer Brother     non-hodkins lymphoma  . Alcohol abuse Sister   . Drug abuse Sister   . Mental illness Sister   . Diabetes Neg Hx   . Stroke Neg Hx     Social History Social History  Substance Use Topics  . Smoking status: Current Every Day Smoker -- 0.50 packs/day for 30 years    Types: Cigarettes  . Smokeless tobacco: Never Used  . Alcohol Use: No    Review of Systems Constitutional: No fever/chills Musculoskeletal: Positive for right knee pain Skin: Negative for rash. Neurological: Negative for headaches, focal weakness or numbness.  10-point ROS otherwise negative.  ____________________________________________   PHYSICAL EXAM:  VITAL SIGNS: ED Triage Vitals  Enc Vitals Group     BP 07/30/15 1106 133/88 mmHg     Pulse Rate 07/30/15 1106 82     Resp 07/30/15 1106 16     Temp 07/30/15 1106 97.9 F (36.6 C)     Temp Source 07/30/15 1106 Oral     SpO2 07/30/15 1106 96 %     Weight 07/30/15 1106 221 lb (100.245 kg)     Height 07/30/15 1106 5\' 2"  (1.575 m)  Head Cir --      Peak Flow --      Pain Score 07/30/15 1103 8     Pain Loc --      Pain Edu? --      Excl. in [place]? --      Constitutional: Alert and oriented. Well appearing and in no acute distress. Musculoskeletal: Right knee with full range of motion no effusion noted. No warmth. Point tenderness noticed medially. Neurologic:  Normal speech and language. No gross focal neurologic deficits are appreciated. No gait instability. Skin:  Skin is warm, dry and intact. No rash noted. Psychiatric: Mood and affect are normal. Speech and behavior are normal.  ____________________________________________   LABS (all labs ordered are listed, but only abnormal results are displayed)  Labs Reviewed - No data to display ____________________________________________   RADIOLOGY  IMPRESSION: Mild/moderate tricompartmental degenerative changes but no acute bony findings or joint effusion. ____________________________________________   PROCEDURES  Procedure(s) performed: None  Critical Care performed: No  ____________________________________________   INITIAL IMPRESSION / ASSESSMENT AND PLAN / ED COURSE  Pertinent labs & imaging results that were available during my care of the patient were reviewed by me and considered in my medical decision making (see chart for details).  Acute tricompartmental changes degenerative changes to the right knee. Rx given for meloxicam 15 mg daily and Percocet 5/325. She'll follow up with orthopedic surgeon for continued evaluation. ____________________________________________   FINAL CLINICAL IMPRESSION(S) / ED DIAGNOSES  Final diagnoses:  Osteoarthritis of right knee, unspecified osteoarthritis type     This chart was dictated using voice recognition software/Dragon. Despite best efforts to proofread, errors can occur which can change the meaning. Any change was purely unintentional.   Arlyss Repress, PA-C 07/30/15 [place], MD 07/30/15 [phone]

## 2015-07-30 NOTE — Discharge Instructions (Signed)
Heat Therapy °Heat therapy can help ease sore, stiff, injured, and tight muscles and joints. Heat relaxes your muscles, which may help ease your pain. Heat therapy should only be used on old, pre-existing, or long-lasting (chronic) injuries. Do not use heat therapy unless told by your doctor. °HOW TO USE HEAT THERAPY °There are several different kinds of heat therapy, including: °· Moist heat pack. °· Warm water bath. °· Hot water bottle. °· Electric heating pad. °· Heated gel pack. °· Heated wrap. °· Electric heating pad. °GENERAL HEAT THERAPY RECOMMENDATIONS  °· Do not sleep while using heat therapy. Only use heat therapy while you are awake. °· Your skin may turn pink while using heat therapy. Do not use heat therapy if your skin turns red. °· Do not use heat therapy if you have new pain. °· High heat or long exposure to heat can cause burns. Be careful when using heat therapy to avoid burning your skin. °· Do not use heat therapy on areas of your skin that are already irritated, such as with a rash or sunburn. °GET HELP IF:  °· You have blisters, redness, swelling (puffiness), or numbness. °· You have new pain. °· Your pain is worse. °MAKE SURE YOU: °· Understand these instructions. °· Will watch your condition. °· Will get help right away if you are not doing well or get worse. °  °This information is not intended to replace advice given to you by your health care provider. Make sure you discuss any questions you have with your health care provider. °  °Document Released: 07/18/2011 Document Revised: 05/16/2014 Document Reviewed: 06/18/2013 °Elsevier Interactive Patient Education ©2016 Else[employer] Inc. ° °Osteoarthritis °Osteoarthritis is a disease that causes soreness and inflammation of a joint. It occurs when the cartilage at the affected joint wears down. Cartilage acts as a cushion, covering the ends of bones where they meet to form a joint. Osteoarthritis is the most common form of arthritis. It often occurs  in older people. The joints affected most often by this condition include those in the: °· Ends of the fingers. °· Thumbs. °· Neck. °· Lower back. °· Knees. °· Hips. °CAUSES  °Over time, the cartilage that covers the ends of bones begins to wear away. This causes bone to rub on bone, producing pain and stiffness in the affected joints.  °RISK FACTORS °Certain factors can increase your chances of having osteoarthritis, including: °· Older age. °· Excessive body weight. °· Overuse of joints. °· Previous joint injury. °SIGNS AND SYMPTOMS  °· Pain, swelling, and stiffness in the joint. °· Over time, the joint may lose its normal shape. °· Small deposits of bone (osteophytes) may grow on the edges of the joint. °· Bits of bone or cartilage can break off and float inside the joint space. This may cause more pain and damage. °DIAGNOSIS  °Your health care provider will do a physical exam and ask about your symptoms. Various tests may be ordered, such as: °· X-rays of the affected joint. °· Blood tests to rule out other types of arthritis. °Additional tests may be used to diagnose your condition. °TREATMENT  °Goals of treatment are to control pain and improve joint function. Treatment plans may include: °· A prescribed exercise program that allows for rest and joint relief. °· A weight control plan. °· Pain relief techniques, such as: °¨ Properly applied heat and cold. °¨ Electric pulses delivered to nerve endings under the skin (transcutaneous electrical nerve stimulation [TENS]). °¨ Massage. °¨ Certain nutritional   supplements. °· Medicines to control pain, such as: °¨ Acetaminophen. °¨ Nonsteroidal anti-inflammatory drugs (NSAIDs), such as naproxen. °¨ Narcotic or central-acting agents, such as tramadol. °¨ Corticosteroids. These can be given orally or as an injection. °· Surgery to reposition the bones and relieve pain (osteotomy) or to remove loose pieces of bone and cartilage. Joint replacement may be needed in advanced  states of osteoarthritis. °HOME CARE INSTRUCTIONS  °· Take medicines only as directed by your health care provider. °· Maintain a healthy weight. Follow your health care provider's instructions for weight control. This may include dietary instructions. °· Exercise as directed. Your health care provider can recommend specific types of exercise. These may include: °¨ Strengthening exercises. These are done to strengthen the muscles that support joints affected by arthritis. They can be performed with weights or with exercise bands to add resistance. °¨ Aerobic activities. These are exercises, such as brisk walking or low-impact aerobics, that get your heart pumping. °¨ Range-of-motion activities. These keep your joints limber. °¨ Balance and agility exercises. These help you maintain daily living skills. °· Rest your affected joints as directed by your health care provider. °· Keep all follow-up visits as directed by your health care provider. °SEEK MEDICAL CARE IF:  °· Your skin turns red. °· You develop a rash in addition to your joint pain. °· You have worsening joint pain. °· You have a fever along with joint or muscle aches. °SEEK IMMEDIATE MEDICAL CARE IF: °· You have a significant loss of weight or appetite. °· You have night sweats. °FOR MORE INFORMATION  °· National Institute of Arthritis and Musculoskeletal and Skin Diseases: www.niams.nih.gov °· National Institute on Aging: www.nia.nih.gov °· American College of Rheumatology: www.rheumatology.org °  °This information is not intended to replace advice given to you by your health care provider. Make sure you discuss any questions you have with your health care provider. °  °Document Released: 04/25/2005 Document Revised: 05/16/2014 Document Reviewed: 12/31/2012 °Elsevier Interactive Patient Education ©2016 Elsevier Inc. ° °

## 2015-07-30 NOTE — ED Notes (Signed)
Pt c/o right knee pain for the past 3 weeks, was seen at [facility] and Dx with bursitis.Susan [name]

## 2015-07-30 NOTE — ED Notes (Signed)
See triage note  Developed pain to right knee about 3 weeks ago..unsure of injury  Pain to lateral aspect of knee   Ambulates with slight limp d/t pain

## 2015-08-03 ENCOUNTER — Encounter: Payer: Self-pay | Admitting: Family Medicine

## 2015-08-03 ENCOUNTER — Ambulatory Visit (INDEPENDENT_AMBULATORY_CARE_PROVIDER_SITE_OTHER): Payer: Medicare Other | Admitting: Family Medicine

## 2015-08-03 VITALS — BP 130/83 | HR 89 | Temp 99.1°F | Wt 218.0 lb

## 2015-08-03 DIAGNOSIS — G4733 Obstructive sleep apnea (adult) (pediatric): Secondary | ICD-10-CM | POA: Diagnosis not present

## 2015-08-03 DIAGNOSIS — F17200 Nicotine dependence, unspecified, uncomplicated: Secondary | ICD-10-CM

## 2015-08-03 DIAGNOSIS — M25561 Pain in right knee: Secondary | ICD-10-CM

## 2015-08-03 DIAGNOSIS — F1020 Alcohol dependence, uncomplicated: Secondary | ICD-10-CM | POA: Diagnosis not present

## 2015-08-03 DIAGNOSIS — Z1239 Encounter for other screening for malignant neoplasm of breast: Secondary | ICD-10-CM

## 2015-08-03 DIAGNOSIS — E669 Obesity, unspecified: Secondary | ICD-10-CM | POA: Diagnosis not present

## 2015-08-03 DIAGNOSIS — Z9989 Dependence on other enabling machines and devices: Secondary | ICD-10-CM

## 2015-08-03 DIAGNOSIS — F1021 Alcohol dependence, in remission: Secondary | ICD-10-CM

## 2015-08-03 DIAGNOSIS — F3170 Bipolar disorder, currently in remission, most recent episode unspecified: Secondary | ICD-10-CM | POA: Diagnosis not present

## 2015-08-03 DIAGNOSIS — I10 Essential (primary) hypertension: Secondary | ICD-10-CM

## 2015-08-03 MED ORDER — LIRAGLUTIDE -WEIGHT MANAGEMENT 18 MG/3ML ~~LOC~~ SOPN: 0.6000 mg | PEN_INJECTOR | Freq: Every day | SUBCUTANEOUS | Status: DC

## 2015-08-03 NOTE — Assessment & Plan Note (Addendum)
Explained I do not want to prescribe narcotics with her history; will address her pain with non-narcotic means; turmeric, tylenol (limited), temperature (ice topically), and topical agent like biofreeze or something similar; refer to ortho as noted

## 2015-08-03 NOTE — Progress Notes (Signed)
BP 130/83 mmHg  Pulse 89  Temp(Src) 99.1 F (37.3 C)  Wt 218 lb (98.884 kg)  SpO2 96%   Subjective:    Patient ID: Susan [name], female    DOB: 1961/03/07, 55 y.o.   MRN: [id number]  HPI: Susan [name] is a 55 y.o. female  Chief Complaint  Patient presents with  . Hypertension    follow up  . Obesity    follow up   Right knee pain; going on 2 weeks; dull ache in the front of the knee; the more she used it, walked, sat down, got up, cut grass, walked dog, got progressively worse and swelled up; large 100 pound dog takes her for a walk; went to ER on 07/30/15 and they did xrays; she is already on meloxicam 15 mg and does not want another round of steroids; she went to [place] for knee pain in February; she wants to get back to exercising; pain is a 10 out of 10 without medicine; she is not doing ice; doing heat; not doing turmeric; she still has 2-3 of the oxycodone left; they did not give her a brace; has tramadol and oxycodone on her medicine list; she says she is NOT taking them together;   Her sciatica is always wreaking havoc and she saw Dr. Sharlet Salina; stabbing pain in butt and pain on right thigh  She is trying to lose weight; has cuties (citrus fruit), quaker oatmeal bars, bananas; keeping bad snacks out of the house; she wants to get in the pool; has heard about Contrave; has not heard about Belviq or Saxenda; no hx of thyroid cancer; no fam hx of MEN-2; she has tried just tried diet and exercise, no formal programs; she did not want to do Weight Watchers; sister is 300 pounds and tried [employer] and quit after 2 weeks; already met with nutritionist; did calorie count; she found it a little helpful, took it Dr. Gabriel Carina; she is paying attention to portions; endo is monitoring her thyroid function  She knows that "the suicidal crap" was caused by medication, six days of prednisone; knew what was going on because Dr. Leonides Schanz warned her; he told her she not take prednisone; she is  well-established with psychiatrist  Pain on the posterior left heel, fissure below that  Relevant past medical, surgical, family and social history reviewed and updated as indicated Past Medical History  Diagnosis Date  . Thyroid disease     hyperthyroid  . 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 Hepatitis C   . Depression   . Anxiety   . Bipolar 1 disorder ([place])   . History of alcohol abuse   . History of hepatitis C   . Hypertension   . History of MRSA infection 2013  . History of diverticulitis 2013  . H/O suicide attempt     slit wrists  . Osteoporosis   . Arthritis     rheumatoid arthritis  . Sleep apnea     Pt discontinue use of CPAP last year, Dx 3 years ago.   . OSA (obstructive sleep apnea)     managed by Dr. Manuella Ghazi  . Hypothyroidism   . Multinodular thyroid   . Vitamin D deficiency disease   . Vitamin B12 deficiency    Past Surgical History  Procedure Laterality Date  . Salpingoophorectomy Bilateral 11-13-2011  . Breast surgery Left 20 yrs ago  . Cesarean section    . Hernia repair  06/2011, July 2014    Ventral wall  repair with Physiomesh  . Bilateral salpingoophorectomy  July 2013    due to abnormal mass   Social History  Substance Use Topics  . Smoking status: Current Every Day Smoker -- 0.50 packs/day for 30 years    Types: Cigarettes  . Smokeless tobacco: Never Used  . Alcohol Use: No    Interim medical history since our last visit reviewed. Allergies and medications reviewed and updated.  Review of Systems Per HPI unless specifically indicated above     Objective:    BP 130/83 mmHg  Pulse 89  Temp(Src) 99.1 F (37.3 C)  Wt 218 lb (98.884 kg)  SpO2 96%  Wt Readings from Last 3 Encounters:  08/03/15 218 lb (98.884 kg)  07/30/15 221 lb (100.245 kg)  07/08/15 213 lb (96.616 kg)   Body mass index is 39.86 kg/(m^2).  Physical Exam  Constitutional: She appears well-developed and well-nourished.  obese  HENT:  Head: Normocephalic and atraumatic.  Eyes:  EOM are normal. No scleral icterus.  Neck: No JVD present.  Cardiovascular: Normal rate and regular rhythm.   Pulmonary/Chest: Effort normal and breath sounds normal.  Abdominal: She exhibits no distension.  Musculoskeletal: She exhibits no edema.       Right knee: She exhibits decreased range of motion and swelling. She exhibits no effusion, no deformity, no erythema, normal alignment, no LCL laxity, normal patellar mobility and no MCL laxity. Tenderness found. Patellar tendon tenderness noted. No medial joint line and no lateral joint line tenderness noted.    Results for orders placed or performed in visit on 07/08/15  Pap liquid-based and HPV (high risk)  Result Value Ref Range   DIAGNOSIS: Comment    Specimen adequacy: Comment    CLINICIAN PROVIDED ICD10: Comment    Performed by: Comment    QC reviewed by: Comment    PAP SMEAR COMMENT .    Note: Comment    HPV, high-risk Negative Negative      Assessment & Plan:   Problem List Items Addressed This Visit      Cardiovascular and Mediastinum   Hypertension    Controlled today; weight loss and DASH guidelines would help        Respiratory   OSA on CPAP    Continue CPAP to help control pressure but also with weight loss        Other   Tobacco dependence    Not quite ready to quit she says; I am here to help if/when she is ready to quit      Bipolar disorder ([place])    Under care of psychiatrist      Breast cancer screening    Encouraged patient to call to schedule her mammogram      Alcoholism in recovery ([place])    Explained I do not want to prescribe narcotics with her history; will address her pain with non-narcotic means; turmeric, tylenol (limited), temperature (ice topically), and topical agent like biofreeze or something similar; refer to ortho as noted      Pain in right knee - Primary    Refer to ortho, Dr Rudene Christians      Relevant Orders   Ambulatory referral to Orthopedic Surgery   Obesity    Significant  time discussing diet, options for weight loss; she should not use Contrave, and I would not favor Belviq with her psychiatric history (from my limited knowledge of the drug); discussed saxenda and she has no known fam hx of MEN-2, thyroid carcinoma; risks discussed; she'll  consider that; healthy eating, limited portions, etc.         Follow up plan: Return in about 5 weeks (around 09/07/2015).  An after-visit summary was printed and given to the patient at [place].  Please see the patient instructions which may contain other information and recommendations beyond what is mentioned above in the assessment and plan.

## 2015-08-03 NOTE — Patient Instructions (Addendum)
TURMERIC -- Try turmeric as a natural anti-inflammatory (for pain and arthritis). It comes in capsules where you buy aspirin and fish oil, but also as a spice where you buy pepper and garlic powder. Stop heat TEMPERATURE --  Ice four times a day, 15-20 minutes at a time with thin cloth between ice and skin TOPICALS -- bio-freeze or something similar TYLENOL -- maximum of 2,000 mg per day of over-the-counter acetaminophen KNEE TURBAN -- Try a neoprene sleeve to help with the knee and wrap it up I referred you to Dr. Rudene Christians and you should hear something in the next day or two Be very aware of the risk of overdose or death in combining opiates (pain pills) plus benzodiazepines; see the August 2016 warning from the FDA  Start [place] out the information at familydoctor.org entitled "What It Takes to Lose Weight" Try to lose between 1-2 pounds per week by taking in fewer calories and burning off more calories You can succeed by limiting portions, limiting foods dense in calories and fat, becoming more active, and drinking 8 glasses of water a day (64 ounces) Don't skip meals, especially breakfast, as skipping meals may alter your metabolism Do not use over-the-counter weight loss pills or gimmicks that claim rapid weight loss A healthy BMI (or body mass index) is between 18.5 and 24.9 You can calculate your ideal BMI at the [place] website ClubMonetize.fr Return 4 weeks after starting the Saxenda  Please do call to schedule your mammogram; the number to schedule one at either [facility] or [facility] is [phone]

## 2015-08-03 NOTE — Assessment & Plan Note (Signed)
Not quite ready to quit she says; I am here to help if/when she is ready to quit

## 2015-08-03 NOTE — Assessment & Plan Note (Signed)
Refer to ortho, Dr Rudene Christians

## 2015-08-03 NOTE — Assessment & Plan Note (Addendum)
Controlled today; weight loss and DASH guidelines would help

## 2015-08-03 NOTE — Assessment & Plan Note (Signed)
Continue CPAP to help control pressure but also with weight loss

## 2015-08-04 ENCOUNTER — Other Ambulatory Visit: Payer: Self-pay | Admitting: Family Medicine

## 2015-08-05 ENCOUNTER — Telehealth: Payer: Self-pay

## 2015-08-05 NOTE — Telephone Encounter (Signed)
Left message to call.

## 2015-08-05 NOTE — Telephone Encounter (Signed)
Patient notified

## 2015-08-05 NOTE — Telephone Encounter (Signed)
At this time, actually NO I don't want to do the Belviq and we can't use the Contrave I'll suggest that she work with the nutritionist and keep a calorie count for 3 days; find out how many calories she is taking in over those 3 days and then call me back and we'll come up with a calorie restriction plan We also need to make sure she is drinking 64 ounces of water a day Make sure too that she knows about What It Takes to Lose Weight page at [employer] Thanks

## 2015-08-05 NOTE — Telephone Encounter (Signed)
Insurance does not cover the [place], do you want to change to something else?

## 2015-08-06 DIAGNOSIS — M1711 Unilateral primary osteoarthritis, right knee: Secondary | ICD-10-CM | POA: Diagnosis not present

## 2015-08-06 NOTE — Assessment & Plan Note (Signed)
Encouraged patient to call to schedule her mammogram

## 2015-08-06 NOTE — Assessment & Plan Note (Signed)
Under care of psychiatrist °

## 2015-08-06 NOTE — Assessment & Plan Note (Signed)
Significant time discussing diet, options for weight loss; she should not use Contrave, and I would not favor Belviq with her psychiatric history (from my limited knowledge of the drug); discussed saxenda and she has no known fam hx of MEN-2, thyroid carcinoma; risks discussed; she'll consider that; healthy eating, limited portions, etc.

## 2015-08-19 ENCOUNTER — Ambulatory Visit: Payer: Commercial Managed Care - HMO | Attending: Physical Medicine and Rehabilitation

## 2015-08-19 DIAGNOSIS — M5442 Lumbago with sciatica, left side: Secondary | ICD-10-CM

## 2015-08-19 NOTE — Therapy (Signed)
[place] MAIN [facility] SERVICES [address] [place], [place], [address] Phone: [phone]   Fax:  [phone]  Physical Therapy Evaluation  Patient Details  Name: MAKHAYLA HOVDE MRN: [id number] Date of Birth: September 01, 1960 Referring Provider: Sharlet Salina  Encounter Date: 08/19/2015      PT End of Session - 08/19/15 0923    Visit Number 1   Number of Visits 13   Date for PT Re-Evaluation 09/30/15   Authorization Type 1/10   PT Start Time 0830   PT Stop Time 0915   PT Time Calculation (min) 45 min   Activity Tolerance Patient tolerated treatment well;No increased pain   Behavior During Therapy [facility] for tasks assessed/performed      Past Medical History  Diagnosis Date  . Thyroid disease     hyperthyroid  . 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 Hepatitis C   . Depression   . Anxiety   . Bipolar 1 disorder ([place])   . History of alcohol abuse   . History of hepatitis C   . Hypertension   . History of MRSA infection 2013  . History of diverticulitis 2013  . H/O suicide attempt     slit wrists  . Osteoporosis   . Arthritis     rheumatoid arthritis  . Sleep apnea     Pt discontinue use of CPAP last year, Dx 3 years ago.   . OSA (obstructive sleep apnea)     managed by Dr. Manuella Ghazi  . Hypothyroidism   . Multinodular thyroid   . Vitamin D deficiency disease   . Vitamin B12 deficiency     Past Surgical History  Procedure Laterality Date  . Salpingoophorectomy Bilateral 11-13-2011  . Breast surgery Left 20 yrs ago  . Cesarean section    . Hernia repair  06/2011, July 2014    Ventral wall repair with Physiomesh  . Bilateral salpingoophorectomy  July 2013    due to abnormal mass    There were no vitals filed for this visit.       Subjective Assessment - 08/19/15 0840    Subjective pt reports 1-2 years ago she began having sciatic pain mostly in the L leg but also in the R leg at times. pt reports her pain started with insidious onset. pt  reports sitting with her legs crossed seems to make her pain worse. pt reports she is not sure what else or movement makes her leg pain better or worse.    Pertinent History Hx of alcohol use but started to decrease beers from 6-8/day to 2-3/day and current smoker 1 pack a day.  Hx of 1 vaginal surgery (with tears/stitches), 1 C-section, umbilical hernia repair. Currently with diverticulitis and  monitoring diet. Unable to leave the house 1x/week due to frequent Type 6 (Bristol Stool Scale) bowel momvents    Limitations Sitting   How long can you sit comfortably? 30   How long can you walk comfortably? 3 blocks   Currently in Pain? Yes   Pain Score 6    Pain Location Back   Pain Orientation Left;Posterior   Pain Descriptors / Indicators Aching   Pain Type Chronic pain   Pain Onset --   Aggravating Factors  sitting             [facility] PT Assessment - 08/19/15 0001    Assessment   Medical Diagnosis LBP   Referring Provider Chasnis   Onset Date/Surgical Date 08/18/13   Prior Therapy  PT    Precautions   Precautions None   Balance Screen   Has the patient fallen in the past 6 months No   Has the patient had a decrease in activity level because of a fear of falling?  Yes   Is the patient reluctant to leave their home because of a fear of falling?  Yes   [place] Private residence   Living Arrangements Alone   Available Help at Discharge Family   Type of [place] to enter   Entrance Stairs-Number of Steps 5   Entrance Stairs-Rails Can reach both   [place] One level   Prior Function   Level of Independence Independent with household mobility without device  less pain           POSTURE/OBSERVATION: Forward flexed, kyphosis reduced lumbar normal lordosis  PROM/AROM: Lumbar flexion,sidebending WNL Lumbar extension with mod loss  STRENGTH:  Graded on a 0-5 scale Muscle Group Left Right  Shoulder flex    Shoulder Abd     Shoulder Ext    Shoulder IR/ER    Elbow    Wrist/hand     Hip Flex 4 4  Hip Abd 4- 4-  Hip Add 4- 4-  Hip Ext 4- 4-  Hip IR/ER    Knee Flex 4 4  Knee Ext 4 4  Ankle DF 4 4  Ankle PF 4 4   SENSATION: Dermatomes WNL bilaterally  SPECIAL TESTS: Repeated flexion peripheralized pain to L calf  Therex: REIL: x 10 centralized pain to lower back with improved ROM following REIS x 10 Lumbar roll in sitting during pt education: x 4 min                    PT Education - 08/19/15 [id number]    Education provided Yes   Education Details POC, principles of peripheralization/centralization.    Person(s) Educated Patient   Methods Explanation   Comprehension Verbalized understanding             PT Long Term Goals - 08/19/15 0927    PT LONG TERM GOAL #1   Title pt will reduce oswestry score by 15pts to improve function   Baseline 40%   Time 6   Period Weeks   Status New   PT LONG TERM GOAL #2   Title pt will sit with lumbar roll in place x 1 hour without increase in LBP/ leg pain   Time 6   Period Weeks   Status New   PT LONG TERM GOAL #3   Title pt will demonstrate proper lifting mechanics of 15lb item from floor to table x 5 with no inc. pain   Time 6   Period Weeks   Status New   PT LONG TERM GOAL #4   Title pt will be independent and compliant with HEP and independent aquatic exercise.    Time 6   Period Weeks   Status New               Plan - 08/19/15 [id number]    Clinical Impression Statement pt presents as 55 y/o F with chronic LBP with L sided sciatica. pt has hx of HTN, Bipolar disorder, obesity, smoking, multiple abdominal surgeries, Hep C. With subjective history pt does have trouble determining aggs/eases of her pain, however demonstrated worse/peripheral symptoms with prolonged sitting and repeated flexion. pt reported no pain with repeated extension exercises, responding well  to mechanical therapy. pt would benefit from skilled PT  serivces to address pain, ROM, core/hip strength, posture to maximize her function .   Rehab Potential Good   Clinical Impairments Affecting Rehab Potential Prognosis is limited by pt's poor compliance to CPAP use for OSA, alcohol and nicotine use, Hx of abdominal surgeries, and co-morbidities.    PT Frequency 2x / week   PT Duration 6 weeks   PT Treatment/Interventions Electrical Stimulation;Moist Heat;Stair training;Functional mobility training;Therapeutic activities;Therapeutic exercise;Balance training;Dry needling;Manual techniques;Neuromuscular re-education;Patient/family education;Energy conservation;Cryotherapy;Scar mobilization;Aquatic Therapy;Traction;Ultrasound;ADLs/Self Care Home Management   Consulted and Agree with Plan of Care Patient      Patient will benefit from skilled therapeutic intervention in order to improve the following deficits and impairments:  Abnormal gait, Cardiopulmonary status limiting activity, Decreased mobility, Decreased scar mobility, Increased fascial restricitons, Hypomobility, Postural dysfunction, Improper body mechanics, Decreased activity tolerance, Obesity, Other (comment), Pain, Decreased range of motion, Decreased strength  Visit Diagnosis: Lumbago with sciatica, left side - Plan: PT plan of care cert/re-cert      G-Codes - [id number]    Functional Assessment Tool Used ODI, ROM, NRPS   Functional Limitation Mobility: Walking and moving around   Mobility: Walking and Moving Around Current Status [phone]) At least 20 percent but less than 40 percent impaired, limited or restricted   Mobility: Walking and Moving Around Goal Status [phone]) At least 1 percent but less than 20 percent impaired, limited or restricted       Problem List Patient Active Problem List   Diagnosis Date Noted  . Obesity 08/06/2015  . Pain in right knee 08/03/2015  . Postmenopausal 07/08/2015  . Preventative health care 07/08/2015  . Cervical cancer screening  07/08/2015  . Alcoholism in recovery ([place]) 04/13/2015  . Lymphocytosis 04/10/2015  . Impaired fasting glucose 04/10/2015  . Need for Streptococcus pneumoniae and influenza vaccination 04/09/2015  . Polyuria 04/09/2015  . Vitamin D deficiency 04/09/2015  . Vitamin B12 deficiency 04/09/2015  . Medication monitoring encounter 04/09/2015  . OSA on CPAP 04/09/2015  . Breast cancer screening 04/09/2015  . Subclinical hyperthyroidism 06/26/2014  . Toxic multinodular goiter 06/26/2014  . Hypertension 06/26/2014  . Hepatitis C 06/26/2014  . Tobacco dependence 06/26/2014  . Bipolar disorder ([place]) 06/26/2014  . Recurrent ventral hernia 11/08/2012   Gorden Harms. Lachlan Mckim, PT, DPT [phone]  Nadiyah Zeis 08/19/2015, 9:31 AM  [place] MAIN [facility] SERVICES [address] [place], [place], [address] Phone: [phone]   Fax:  [phone]  Name: JUSTINA DAFFRON MRN: [id number] Date of Birth: 04/19/1961

## 2015-08-19 NOTE — Patient Instructions (Signed)
[email or web address]  Prone press up x 10 Standing lumbar extension x 10 To be performed every other waking hour

## 2015-08-25 ENCOUNTER — Ambulatory Visit: Payer: Commercial Managed Care - HMO | Admitting: Physical Therapy

## 2015-08-25 DIAGNOSIS — M5442 Lumbago with sciatica, left side: Secondary | ICD-10-CM

## 2015-08-26 ENCOUNTER — Ambulatory Visit: Payer: Commercial Managed Care - HMO

## 2015-08-26 DIAGNOSIS — F4310 Post-traumatic stress disorder, unspecified: Secondary | ICD-10-CM | POA: Diagnosis not present

## 2015-08-26 DIAGNOSIS — M5442 Lumbago with sciatica, left side: Secondary | ICD-10-CM

## 2015-08-26 NOTE — Patient Instructions (Signed)
[email or web address] Bridging 2x10 Alt LE march with Diaphragmatic breathing (initiating TA contraction) 2x10

## 2015-08-26 NOTE — Therapy (Signed)
[place] MAIN [facility] SERVICES [address] [place], [place], [address] Phone: [phone]   Fax:  [phone]  Physical Therapy Treatment  Patient Details  Name: Susan [name] MRN: [id number] Date of Birth: Jul 10, 1960 Referring Provider: Sharlet Salina  Encounter Date: 08/25/2015      PT End of Session - 08/25/15 1908    Visit Number 2   Number of Visits 13   Date for PT Re-Evaluation 09/30/15   Authorization Type 3/10   PT Start Time 0805   PT Stop Time 0850   PT Time Calculation (min) 45 min   Activity Tolerance Patient tolerated treatment well;No increased pain   Behavior During Therapy [facility] for tasks assessed/performed      Past Medical History  Diagnosis Date  . Thyroid disease     hyperthyroid  . 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 Hepatitis C   . Depression   . Anxiety   . Bipolar 1 disorder ([place])   . History of alcohol abuse   . History of hepatitis C   . Hypertension   . History of MRSA infection 2013  . History of diverticulitis 2013  . H/O suicide attempt     slit wrists  . Osteoporosis   . Arthritis     rheumatoid arthritis  . Sleep apnea     Pt discontinue use of CPAP last year, Dx 3 years ago.   . OSA (obstructive sleep apnea)     managed by Dr. Manuella Ghazi  . Hypothyroidism   . Multinodular thyroid   . Vitamin D deficiency disease   . Vitamin B12 deficiency     Past Surgical History  Procedure Laterality Date  . Salpingoophorectomy Bilateral 11-13-2011  . Breast surgery Left 20 yrs ago  . Cesarean section    . Hernia repair  06/2011, July 2014    Ventral wall repair with Physiomesh  . Bilateral salpingoophorectomy  July 2013    due to abnormal mass    There were no vitals filed for this visit.      Subjective Assessment - 08/25/15 1633    Subjective  After her last PT session, pt reported she could not turn or rotate her trunk L/R due radiating pain when walking.This pain lasted three days.  After that, pt 's pain  improved and she did not have to take any pain medicine. When pt walked the dogs this morning, pt did not get radiating pain, only had localized "ache" at the low back pain.   Pt states her radiating pain started when she was walking her dogs when they pulled away from the leash. Pt's pain level right now is 2-3/10. Pt is feeling frustrated that one day, the pain may return when her dogs pull on the leash and she will tense. Pt is willing to keep trying with therapy and decrease pain medications. Pt has an appt with Dr. Sharlet Salina this week. Pt wants to avoid surgery.      Pertinent History Hx of alcohol use but started to decrease beers from 6-8/day to 2-3/day and current smoker 1 pack a day.  Hx of 1 vaginal surgery (with tears/stitches), 1 C-section, umbilical hernia repair. Currently with diverticulitis and  monitoring diet. Unable to leave the house 1x/week due to frequent Type 6 (Bristol Stool Scale) bowel momvents    Limitations Sitting   How long can you sit comfortably? 30   How long can you walk comfortably? 3 blocks   Currently in Pain? Yes  Pain Score 3    Pain Location Back   Pain Descriptors / Indicators Aching                     Adult Aquatic Therapy - 08/25/15 1907    Aquatic Therapy Subjective   Subjective Pt had no complaints with aquatic Tx today       O: Pt entered/exited pool via ramp with UE on rail.  Lap =50 feet  2  laps of walking  In 4'6"  Exercises performed in 3'6" depth: 4 laps of perturbation training with PT applying manual perturbation at her waist . Cued for quicker stepping strategy 10 x 4 bilaterally backward stepping in semi tandem (simulated task of pulling dog on leash , counter force applied by PT) cured for decreased shoulder elevation 4 laps of unanticipated pulling by PT on noodle in multidirection (simulated task of pulling dog on leash)  2 laps Sidestepping squats L/R  Floating on noodles, gentle pulled by PT  5'                   PT Long Term Goals - 08/19/15 [id number]    PT LONG TERM GOAL #1   Title pt will reduce oswestry score by 15pts to improve function   Baseline 40%   Time 6   Period Weeks   Status New   PT LONG TERM GOAL #2   Title pt will sit with lumbar roll in place x 1 hour without increase in LBP/ leg pain   Time 6   Period Weeks   Status New   PT LONG TERM GOAL #3   Title pt will demonstrate proper lifting mechanics of 15lb item from floor to table x 5 with no inc. pain   Time 6   Period Weeks   Status New   PT LONG TERM GOAL #4   Title pt will be independent and compliant with HEP and independent aquatic exercise.    Time 6   Period Weeks   Status New               Plan - 08/26/15 1909    Clinical Impression Statement Pt responded well to aquatic Tx without complaints. Pt demo'd increased propioception and postural stability after training. Pt will continue to benefit form skilled PT.   Rehab Potential Good   Clinical Impairments Affecting Rehab Potential Prognosis is limited by pt's poor compliance to CPAP use for OSA, alcohol and nicotine use, Hx of abdominal surgeries, and co-morbidities.    PT Frequency 2x / week   PT Duration 6 weeks   PT Treatment/Interventions Electrical Stimulation;Moist Heat;Stair training;Functional mobility training;Therapeutic activities;Therapeutic exercise;Balance training;Dry needling;Manual techniques;Neuromuscular re-education;Patient/family education;Energy conservation;Cryotherapy;Scar mobilization;Aquatic Therapy;Traction;Ultrasound;ADLs/Self Care Home Management   Consulted and Agree with Plan of Care Patient      Patient will benefit from skilled therapeutic intervention in order to improve the following deficits and impairments:  Abnormal gait, Cardiopulmonary status limiting activity, Decreased mobility, Decreased scar mobility, Increased fascial restricitons, Hypomobility, Postural dysfunction, Improper body  mechanics, Decreased activity tolerance, Obesity, Other (comment), Pain, Decreased range of motion, Decreased strength  Visit Diagnosis: Lumbago with sciatica, left side     Problem List Patient Active Problem List   Diagnosis Date Noted  . Obesity 08/06/2015  . Pain in right knee 08/03/2015  . Postmenopausal 07/08/2015  . Preventative health care 07/08/2015  . Cervical cancer screening 07/08/2015  . Alcoholism in recovery ([place]) 04/13/2015  . Lymphocytosis 04/10/2015  .  Impaired fasting glucose 04/10/2015  . Need for Streptococcus pneumoniae and influenza vaccination 04/09/2015  . Polyuria 04/09/2015  . Vitamin D deficiency 04/09/2015  . Vitamin B12 deficiency 04/09/2015  . Medication monitoring encounter 04/09/2015  . OSA on CPAP 04/09/2015  . Breast cancer screening 04/09/2015  . Subclinical hyperthyroidism 06/26/2014  . Toxic multinodular goiter 06/26/2014  . Hypertension 06/26/2014  . Hepatitis C 06/26/2014  . Tobacco dependence 06/26/2014  . Bipolar disorder ([place]) 06/26/2014  . Recurrent ventral hernia 11/08/2012    Jerl Mina ,PT, DPT, E-RYT  08/26/2015, 7:18 PM  [place] MAIN [facility] SERVICES [address] [place], [place], [address] Phone: [phone]   Fax:  [phone]  Name: Susan [name] MRN: [id number] Date of Birth: [birth year]

## 2015-08-26 NOTE — Therapy (Addendum)
[place] MAIN [facility] SERVICES [address] [place], [place], [address] Phone: [phone]   Fax:  [phone]  Physical Therapy Treatment  Patient Details  Name: Susan [name] MRN: [id number] Date of Birth: 12-Jan-1961 Referring Provider: Sharlet Salina  Encounter Date: 08/26/2015      PT End of Session - 08/26/15 1908    Visit Number 3   Number of Visits 13   Date for PT Re-Evaluation 09/30/15   Authorization Type 3/10   PT Start Time 0805   PT Stop Time 0850   PT Time Calculation (min) 45 min   Activity Tolerance Patient tolerated treatment well;No increased pain   Behavior During Therapy [facility] for tasks assessed/performed      Past Medical History  Diagnosis Date  . Thyroid disease     hyperthyroid  . 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 Hepatitis C   . Depression   . Anxiety   . Bipolar 1 disorder ([name])   . History of alcohol abuse   . History of hepatitis C   . Hypertension   . History of MRSA infection 2013  . History of diverticulitis 2013  . H/O suicide attempt     slit wrists  . Osteoporosis   . Arthritis     rheumatoid arthritis  . Sleep apnea     Pt discontinue use of CPAP last year, Dx 3 years ago.   . OSA (obstructive sleep apnea)     managed by Dr. Manuella Ghazi  . Hypothyroidism   . Multinodular thyroid   . Vitamin D deficiency disease   . Vitamin B12 deficiency     Past Surgical History  Procedure Laterality Date  . Salpingoophorectomy Bilateral 11-13-2011  . Breast surgery Left 20 yrs ago  . Cesarean section    . Hernia repair  06/2011, July 2014    Ventral wall repair with Physiomesh  . Bilateral salpingoophorectomy  July 2013    due to abnormal mass    There were no vitals filed for this visit.      Subjective Assessment - 08/26/15 1633    Subjective pt reports she was pretty sore after the eval but she continued with the exercises. she reports her sciatic pain is not present. she reports 3/10 back pain    Pertinent  History Hx of alcohol use but started to decrease beers from 6-8/day to 2-3/day and current smoker 1 pack a day.  Hx of 1 vaginal surgery (with tears/stitches), 1 C-section, umbilical hernia repair. Currently with diverticulitis and  monitoring diet. Unable to leave the house 1x/week due to frequent Type 6 (Bristol Stool Scale) bowel momvents    Limitations Sitting   How long can you sit comfortably? 30   How long can you walk comfortably? 3 blocks   Currently in Pain? Yes   Pain Score 3    Pain Location Back   Pain Descriptors / Indicators Aching      Manual therapy CPA and unilateral R mobs to lumbar spine grade II-III 3 bouts of 20s each L1-5   hypomobility noted but improved with therapy  Therex: Bridging 2x10 Alt LE march with Diaphragmatic breathing (initiating TA contraction) 2x10 Pt requires min verbal and tactile cues for proper exercise performance     MHP prior to tx x 8 min no charge              Adult Aquatic Therapy - 08/26/15 1907    Aquatic Therapy Subjective   Subjective Pt  had no complaints with aquatic Tx today                         PT Long Term Goals - 08/19/15 0927    PT LONG TERM GOAL #1   Title pt will reduce oswestry score by 15pts to improve function   Baseline 40%   Time 6   Period Weeks   Status New   PT LONG TERM GOAL #2   Title pt will sit with lumbar roll in place x 1 hour without increase in LBP/ leg pain   Time 6   Period Weeks   Status New   PT LONG TERM GOAL #3   Title pt will demonstrate proper lifting mechanics of 15lb item from floor to table x 5 with no inc. pain   Time 6   Period Weeks   Status New   PT LONG TERM GOAL #4   Title pt will be independent and compliant with HEP and independent aquatic exercise.    Time 6   Period Weeks   Status New               Plan - 08/26/15 1909    Clinical Impression Statement ZXX   Rehab Potential Good   Clinical Impairments Affecting Rehab Potential  Prognosis is limited by pt's poor compliance to CPAP use for OSA, alcohol and nicotine use, Hx of abdominal surgeries, and co-morbidities.    PT Frequency 2x / week   PT Duration 6 weeks   PT Treatment/Interventions Electrical Stimulation;Moist Heat;Stair training;Functional mobility training;Therapeutic activities;Therapeutic exercise;Balance training;Dry needling;Manual techniques;Neuromuscular re-education;Patient/family education;Energy conservation;Cryotherapy;Scar mobilization;Aquatic Therapy;Traction;Ultrasound;ADLs/Self Care Home Management   Consulted and Agree with Plan of Care Patient      Patient will benefit from skilled therapeutic intervention in order to improve the following deficits and impairments:  Abnormal gait, Cardiopulmonary status limiting activity, Decreased mobility, Decreased scar mobility, Increased fascial restricitons, Hypomobility, Postural dysfunction, Improper body mechanics, Decreased activity tolerance, Obesity, Other (comment), Pain, Decreased range of motion, Decreased strength  Visit Diagnosis: Lumbago with sciatica, left side     Problem List Patient Active Problem List   Diagnosis Date Noted  . Obesity 08/06/2015  . Pain in right knee 08/03/2015  . Postmenopausal 07/08/2015  . Preventative health care 07/08/2015  . Cervical cancer screening 07/08/2015  . Alcoholism in recovery ([place]) 04/13/2015  . Lymphocytosis 04/10/2015  . Impaired fasting glucose 04/10/2015  . Need for Streptococcus pneumoniae and influenza vaccination 04/09/2015  . Polyuria 04/09/2015  . Vitamin D deficiency 04/09/2015  . Vitamin B12 deficiency 04/09/2015  . Medication monitoring encounter 04/09/2015  . OSA on CPAP 04/09/2015  . Breast cancer screening 04/09/2015  . Subclinical hyperthyroidism 06/26/2014  . Toxic multinodular goiter 06/26/2014  . Hypertension 06/26/2014  . Hepatitis C 06/26/2014  . Tobacco dependence 06/26/2014  . Bipolar disorder ([place]) 06/26/2014  .  Recurrent ventral hernia 11/08/2012    Goldye Tourangeau 08/27/2015, 1:05 PM  [place] MAIN [facility] SERVICES [place], [place], [address] Phone: [phone]   Fax:  [phone]  Name: STANISHA SCHACHER MRN: [id number] Date of Birth: 08/24/1960

## 2015-08-27 DIAGNOSIS — M4806 Spinal stenosis, lumbar region: Secondary | ICD-10-CM | POA: Diagnosis not present

## 2015-08-27 DIAGNOSIS — M5136 Other intervertebral disc degeneration, lumbar region: Secondary | ICD-10-CM | POA: Diagnosis not present

## 2015-08-27 DIAGNOSIS — M5416 Radiculopathy, lumbar region: Secondary | ICD-10-CM | POA: Diagnosis not present

## 2015-08-31 ENCOUNTER — Ambulatory Visit: Payer: Commercial Managed Care - HMO

## 2015-08-31 ENCOUNTER — Other Ambulatory Visit: Payer: Self-pay | Admitting: Family Medicine

## 2015-08-31 DIAGNOSIS — M5442 Lumbago with sciatica, left side: Secondary | ICD-10-CM | POA: Diagnosis not present

## 2015-08-31 NOTE — Telephone Encounter (Signed)
Rx approved

## 2015-08-31 NOTE — Therapy (Signed)
[place] MAIN [facility] SERVICES [address] [place], [place], [address] Phone: [phone]   Fax:  [phone]  Physical Therapy Treatment  Patient Details  Name: Susan [name] MRN: [id number] Date of Birth: 10-06-1960 Referring Provider: Sharlet Salina  Encounter Date: 08/31/2015      PT End of Session - 08/31/15 0816    Visit Number 4   Number of Visits 13   Date for PT Re-Evaluation 09/30/15   Authorization Type 4/10   PT Start Time 0808   PT Stop Time 0845   PT Time Calculation (min) 37 min   Activity Tolerance Patient tolerated treatment well;No increased pain   Behavior During Therapy [facility] for tasks assessed/performed      Past Medical History  Diagnosis Date  . Thyroid disease     hyperthyroid  . 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 Hepatitis C   . Depression   . Anxiety   . Bipolar 1 disorder ([place])   . History of alcohol abuse   . History of hepatitis C   . Hypertension   . History of MRSA infection 2013  . History of diverticulitis 2013  . H/O suicide attempt     slit wrists  . Osteoporosis   . Arthritis     rheumatoid arthritis  . Sleep apnea     Pt discontinue use of CPAP last year, Dx 3 years ago.   . OSA (obstructive sleep apnea)     managed by Dr. Manuella Ghazi  . Hypothyroidism   . Multinodular thyroid   . Vitamin D deficiency disease   . Vitamin B12 deficiency     Past Surgical History  Procedure Laterality Date  . Salpingoophorectomy Bilateral 11-13-2011  . Breast surgery Left 20 yrs ago  . Cesarean section    . Hernia repair  06/2011, July 2014    Ventral wall repair with Physiomesh  . Bilateral salpingoophorectomy  July 2013    due to abnormal mass    There were no vitals filed for this visit.      Subjective Assessment - 08/31/15 0814    Subjective pt reports she went to f/u with MD who told her the MRI results. she reports her sciatic pain is much less. she is having some lower back pain this morning, but she  didnt do any of her exercises yesterday.    Pertinent History Hx of alcohol use but started to decrease beers from 6-8/day to 2-3/day and current smoker 1 pack a day.  Hx of 1 vaginal surgery (with tears/stitches), 1 C-section, umbilical hernia repair. Currently with diverticulitis and  monitoring diet. Unable to leave the house 1x/week due to frequent Type 6 (Bristol Stool Scale) bowel momvents    Limitations Sitting   How long can you sit comfortably? 30   How long can you walk comfortably? 3 blocks   Currently in Pain? Yes   Pain Score 3    Pain Location Back   Pain Orientation Lower   Pain Descriptors / Indicators Aching    MHP to lower back x 6 min no charge prior to manual therapy   Manual therapy: CPA glides to L1-5 grade II to L1-13 grade III to L4-5 4 bouts 30s xeach MFR to thoracolumbar fascia x 3 min   Therex: LTR x 90s  Diaphragmatic breathing (initiating TA contraction) with bridge 2x10 Diaphragmatic breathing (initiating TA contraction) with alt LE March 2x10 sidelying clamshells x15 Pt requires min verbal and tactile cues for proper exercise  performance                             PT Education - 08/31/15 0815    Education provided Yes   Education Details current research regarding imaging results and its relation to pain   Person(s) Educated Patient   Methods Explanation   Comprehension Verbalized understanding             PT Long Term Goals - 08/19/15 0927    PT LONG TERM GOAL #1   Title pt will reduce oswestry score by 15pts to improve function   Baseline 40%   Time 6   Period Weeks   Status New   PT LONG TERM GOAL #2   Title pt will sit with lumbar roll in place x 1 hour without increase in LBP/ leg pain   Time 6   Period Weeks   Status New   PT LONG TERM GOAL #3   Title pt will demonstrate proper lifting mechanics of 15lb item from floor to table x 5 with no inc. pain   Time 6   Period Weeks   Status New   PT LONG TERM  GOAL #4   Title pt will be independent and compliant with HEP and independent aquatic exercise.    Time 6   Period Weeks   Status New               Plan - 08/31/15 [id number]    Clinical Impression Statement pt progressing very well through PT. she is having less sciatic pain for 48+ hours. is responds well to manual therapy and therex as well with significant pain reduction following therapy sessions.    Rehab Potential Good   Clinical Impairments Affecting Rehab Potential Prognosis is limited by pt's poor compliance to CPAP use for OSA, alcohol and nicotine use, Hx of abdominal surgeries, and co-morbidities.    PT Frequency 2x / week   PT Duration 6 weeks   PT Treatment/Interventions Electrical Stimulation;Moist Heat;Stair training;Functional mobility training;Therapeutic activities;Therapeutic exercise;Balance training;Dry needling;Manual techniques;Neuromuscular re-education;Patient/family education;Energy conservation;Cryotherapy;Scar mobilization;Aquatic Therapy;Traction;Ultrasound;ADLs/Self Care Home Management   Consulted and Agree with Plan of Care Patient      Patient will benefit from skilled therapeutic intervention in order to improve the following deficits and impairments:  Abnormal gait, Cardiopulmonary status limiting activity, Decreased mobility, Decreased scar mobility, Increased fascial restricitons, Hypomobility, Postural dysfunction, Improper body mechanics, Decreased activity tolerance, Obesity, Other (comment), Pain, Decreased range of motion, Decreased strength  Visit Diagnosis: Lumbago with sciatica, left side     Problem List Patient Active Problem List   Diagnosis Date Noted  . Obesity 08/06/2015  . Pain in right knee 08/03/2015  . Postmenopausal 07/08/2015  . Preventative health care 07/08/2015  . Cervical cancer screening 07/08/2015  . Alcoholism in recovery ([place]) 04/13/2015  . Lymphocytosis 04/10/2015  . Impaired fasting glucose 04/10/2015  . Need  for Streptococcus pneumoniae and influenza vaccination 04/09/2015  . Polyuria 04/09/2015  . Vitamin D deficiency 04/09/2015  . Vitamin B12 deficiency 04/09/2015  . Medication monitoring encounter 04/09/2015  . OSA on CPAP 04/09/2015  . Breast cancer screening 04/09/2015  . Subclinical hyperthyroidism 06/26/2014  . Toxic multinodular goiter 06/26/2014  . Hypertension 06/26/2014  . Hepatitis C 06/26/2014  . Tobacco dependence 06/26/2014  . Bipolar disorder ([place]) 06/26/2014  . Recurrent ventral hernia 11/08/2012   Gorden Harms. Jabria Loos, PT, DPT [phone]  Susan [name] 08/31/2015, 8:47 AM  [place]  [place] MAIN [facility] SERVICES [place], [place], [address] Phone: [phone]   Fax:  [phone]  Name: Susan [name] MRN: [id number] Date of Birth: 12-28-1960

## 2015-09-01 ENCOUNTER — Encounter: Payer: Self-pay | Admitting: Family Medicine

## 2015-09-01 ENCOUNTER — Ambulatory Visit (INDEPENDENT_AMBULATORY_CARE_PROVIDER_SITE_OTHER): Payer: Commercial Managed Care - HMO | Admitting: Family Medicine

## 2015-09-01 ENCOUNTER — Ambulatory Visit: Payer: Medicare Other

## 2015-09-01 VITALS — BP 122/74 | HR 79 | Temp 97.9°F | Resp 14 | Ht 62.0 in | Wt 216.0 lb

## 2015-09-01 DIAGNOSIS — R7301 Impaired fasting glucose: Secondary | ICD-10-CM | POA: Diagnosis not present

## 2015-09-01 DIAGNOSIS — F1021 Alcohol dependence, in remission: Secondary | ICD-10-CM

## 2015-09-01 DIAGNOSIS — I10 Essential (primary) hypertension: Secondary | ICD-10-CM | POA: Diagnosis not present

## 2015-09-01 DIAGNOSIS — F1020 Alcohol dependence, uncomplicated: Secondary | ICD-10-CM

## 2015-09-01 DIAGNOSIS — G4733 Obstructive sleep apnea (adult) (pediatric): Secondary | ICD-10-CM

## 2015-09-01 DIAGNOSIS — E538 Deficiency of other specified B group vitamins: Secondary | ICD-10-CM

## 2015-09-01 DIAGNOSIS — Z9989 Dependence on other enabling machines and devices: Secondary | ICD-10-CM

## 2015-09-01 DIAGNOSIS — F17200 Nicotine dependence, unspecified, uncomplicated: Secondary | ICD-10-CM | POA: Diagnosis not present

## 2015-09-01 DIAGNOSIS — E669 Obesity, unspecified: Secondary | ICD-10-CM

## 2015-09-01 NOTE — Patient Instructions (Addendum)
Your next A1c and fasting lipids will be due on or after June 2nd Check out the information at familydoctor.org entitled "What It Takes to Lose Weight" Try to lose between 1-2 pounds per week by taking in fewer calories and burning off more calories You can succeed by limiting portions, limiting foods dense in calories and fat, becoming more active, and drinking 8 glasses of water a day (64 ounces) Don't skip meals, especially breakfast, as skipping meals may alter your metabolism Do not use over-the-counter weight loss pills or gimmicks that claim rapid weight loss A healthy BMI (or body mass index) is between 18.5 and 24.9 You can calculate your ideal BMI at the [place] website ClubMonetize.fr  I do encourage you to quit smoking Call [phone] to sign up for smoking cessation classes You can call 1-800-QUIT-NOW to talk with a smoking cessation coach  Pick up some sublingual (SL) vitamin B12, and take 500 or 1000 mcg daily  Smoking Cessation, Tips for Success If you are ready to quit smoking, congratulations! You have chosen to help yourself be healthier. Cigarettes bring nicotine, tar, carbon monoxide, and other irritants into your body. Your lungs, heart, and blood vessels will be able to work better without these poisons. There are many different ways to quit smoking. Nicotine gum, nicotine patches, a nicotine inhaler, or nicotine nasal spray can help with physical craving. Hypnosis, support groups, and medicines help break the habit of smoking. WHAT THINGS CAN I DO TO MAKE QUITTING EASIER?  Here are some tips to help you quit for good:  Pick a date when you will quit smoking completely. Tell all of your friends and family about your plan to quit on that date.  Do not try to slowly cut down on the number of cigarettes you are smoking. Pick a quit date and quit smoking completely starting on that day.  Throw away all cigarettes.   Clean  and remove all ashtrays from your home, work, and car.  On a card, write down your reasons for quitting. Carry the card with you and read it when you get the urge to smoke.  Cleanse your body of nicotine. Drink enough water and fluids to keep your urine clear or pale yellow. Do this after quitting to flush the nicotine from your body.  Learn to predict your moods. Do not let a bad situation be your excuse to have a cigarette. Some situations in your life might tempt you into wanting a cigarette.  Never have "just one" cigarette. It leads to wanting another and another. Remind yourself of your decision to quit.  Change habits associated with smoking. If you smoked while driving or when feeling stressed, try other activities to replace smoking. Stand up when drinking your coffee. Brush your teeth after eating. Sit in a different chair when you read the paper. Avoid alcohol while trying to quit, and try to drink fewer caffeinated beverages. Alcohol and caffeine may urge you to smoke.  Avoid foods and drinks that can trigger a desire to smoke, such as sugary or spicy foods and alcohol.  Ask people who smoke not to smoke around you.  Have something planned to do right after eating or having a cup of coffee. For example, plan to take a walk or exercise.  Try a relaxation exercise to calm you down and decrease your stress. Remember, you may be tense and nervous for the first 2 weeks after you quit, but this will pass.  Find new activities to keep  your hands busy. Play with a pen, coin, or rubber band. Doodle or draw things on paper.  Brush your teeth right after eating. This will help cut down on the craving for the taste of tobacco after meals. You can also try mouthwash.   Use oral substitutes in place of cigarettes. Try using lemon drops, carrots, cinnamon sticks, or chewing gum. Keep them handy so they are available when you have the urge to smoke.  When you have the urge to smoke, try deep  breathing.  Designate your home as a nonsmoking area.  If you are a heavy smoker, ask your health care provider about a prescription for nicotine chewing gum. It can ease your withdrawal from nicotine.  Reward yourself. Set aside the cigarette money you save and buy yourself something nice.  Look for support from others. Join a support group or smoking cessation program. Ask someone at home or at work to help you with your plan to quit smoking.  Always ask yourself, "Do I need this cigarette or is this just a reflex?" Tell yourself, "Today, I choose not to smoke," or "I do not want to smoke." You are reminding yourself of your decision to quit.  Do not replace cigarette smoking with electronic cigarettes (commonly called e-cigarettes). The safety of e-cigarettes is unknown, and some may contain harmful chemicals.  If you relapse, do not give up! Plan ahead and think about what you will do the next time you get the urge to smoke. HOW WILL I FEEL WHEN I QUIT SMOKING? You may have symptoms of withdrawal because your body is used to nicotine (the addictive substance in cigarettes). You may crave cigarettes, be irritable, feel very hungry, cough often, get headaches, or have difficulty concentrating. The withdrawal symptoms are only temporary. They are strongest when you first quit but will go away within 10-14 days. When withdrawal symptoms occur, stay in control. Think about your reasons for quitting. Remind yourself that these are signs that your body is healing and getting used to being without cigarettes. Remember that withdrawal symptoms are easier to treat than the major diseases that smoking can cause.  Even after the withdrawal is over, expect periodic urges to smoke. However, these cravings are generally short lived and will go away whether you smoke or not. Do not smoke! WHAT RESOURCES ARE AVAILABLE TO HELP ME QUIT SMOKING? Your health care provider can direct you to community resources or  hospitals for support, which may include:  Group support.  Education.  Hypnosis.  Therapy.   This information is not intended to replace advice given to you by your health care provider. Make sure you discuss any questions you have with your health care provider.   Document Released: 01/22/2004 Document Revised: 05/16/2014 Document Reviewed: 10/11/2012 Elsevier Interactive Patient Education [employer].

## 2015-09-01 NOTE — Assessment & Plan Note (Signed)
Well controlled 

## 2015-09-01 NOTE — Assessment & Plan Note (Signed)
Next A1c is due in June

## 2015-09-01 NOTE — Assessment & Plan Note (Signed)
Start SL B12 

## 2015-09-01 NOTE — Progress Notes (Signed)
BP 122/74 mmHg  Pulse 79  Temp(Src) 97.9 F (36.6 C) (Oral)  Resp 14  Ht 5\' 2"  (1.575 m)  Wt 216 lb (97.977 kg)  BMI 39.50 kg/m2  SpO2 93%   Subjective:    Patient ID: Susan [name], female    DOB: 05-24-1960, 55 y.o.   MRN: [id number]  HPI: Susan [name] is a 55 y.o. female  Chief Complaint  Patient presents with  . Follow-up   She is here for follow-up She does not eat much; she has been doin the dried cranberries, walnuts, and banana chips as a snack; had that for breakfast and wasn't hungry for hours and hours; maybe not drinking enough water she thinks Active and cutting grass over the weekend; careful to not overheat Doing water therapy for her back; did PT yesterday; weather does worsen her back some Dr. Leonides Schanz is her psychiatrist; on clonazepam and that's short-term; seeing him once a month; not depressed, just sad Dr. Gabriel Carina monitors her thyroid; she did the radiation therapy; things taste bitter after that TSH  Status: Finalresult Visible to patient:  MyChart              Ref Range 27mo ago (04/09/15) 31yr ago (05/26/14) 44yr ago (12/18/13) 42yr ago (09/19/12)    TSH 0.450 - 4.500 uIU/mL 0.030 (L) 0.27 (A)R 0.34 (A)R 0.14 (A)R        She is thinking about trying to quit smoking while at the beach; may use patch She is now 8 months clean and sober  Depression screen [facility] 2/9 09/01/2015 07/08/2015  Decreased Interest 0 0  Down, Depressed, Hopeless 1 3  PHQ - 2 Score 1 3  Altered sleeping - 3  Tired, decreased energy - 0  Change in appetite - 3  Feeling bad or failure about yourself  - 0  Trouble concentrating - 0  Moving slowly or fidgety/restless - 0  Suicidal thoughts - 1  PHQ-9 Score - 10  Difficult doing work/chores - Somewhat difficult   Relevant past medical, surgical, family and social history reviewed Past Medical History  Diagnosis Date  . Thyroid disease     hyperthyroid  . 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 Hepatitis C   . Depression   .  Anxiety   . Bipolar 1 disorder ([place])   . History of alcohol abuse   . History of hepatitis C   . Hypertension   . History of MRSA infection 2013  . History of diverticulitis 2013  . H/O suicide attempt     slit wrists  . Osteoporosis   . Arthritis     rheumatoid arthritis  . Sleep apnea     Pt discontinue use of CPAP last year, Dx 3 years ago.   . OSA (obstructive sleep apnea)     managed by Dr. Manuella Ghazi  . Hypothyroidism   . Multinodular thyroid   . Vitamin D deficiency disease   . Vitamin B12 deficiency    Past Surgical History  Procedure Laterality Date  . Salpingoophorectomy Bilateral 11-13-2011  . Breast surgery Left 20 yrs ago  . Cesarean section    . Hernia repair  06/2011, July 2014    Ventral wall repair with Physiomesh  . Bilateral salpingoophorectomy  July 2013    due to abnormal mass   Family History  Problem Relation Age of Onset  . Alcohol abuse Father   . Arthritis Mother   . Asthma Mother   . Mental illness Mother   .  Thyroid disease Mother   . COPD Mother   . Heart disease Mother   . Congestive Heart Failure Mother   . Arthritis Brother   . Mental illness Brother   . Cancer Brother     non-hodkins lymphoma  . Alcohol abuse Sister   . Drug abuse Sister   . Mental illness Sister   . Diabetes Neg Hx   . Stroke Neg Hx    Social History   Social History  . Marital Status: Widowed    Spouse Name: N/A  . Number of Children: N/A  . Years of Education: N/A   Occupational History  . Not on file.   Social History Main Topics  . Smoking status: Current Every Day Smoker -- 0.50 packs/day for 30 years    Types: Cigarettes  . Smokeless tobacco: Never Used  . Alcohol Use: No  . Drug Use: No     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  . Sexual Activity: No   Other Topics Concern  . Not on file   Social History Narrative  8 months clean and sober  Interim medical history since our last visit reviewed. Allergies and medications  reviewed and updated.  Review of Systems Per HPI unless specifically indicated above     Objective:    BP 122/74 mmHg  Pulse 79  Temp(Src) 97.9 F (36.6 C) (Oral)  Resp 14  Ht 5\' 2"  (1.575 m)  Wt 216 lb (97.977 kg)  BMI 39.50 kg/m2  SpO2 93%  Wt Readings from Last 3 Encounters:  09/01/15 216 lb (97.977 kg)  08/03/15 218 lb (98.884 kg)  07/30/15 221 lb (100.245 kg)    Physical Exam  Constitutional: She appears well-developed and well-nourished. No distress.  Weight down five pounds in one month  HENT:  Head: Normocephalic and atraumatic.  Eyes: EOM are normal. No scleral icterus.  Neck: No JVD present.  Cardiovascular: Normal rate and regular rhythm.   Pulmonary/Chest: Effort normal and breath sounds normal.  Musculoskeletal: She exhibits no edema.  Neurological: She is alert.  Skin: Skin is warm and dry. No bruising and no ecchymosis noted. No pallor.  Psychiatric: She has a normal mood and affect.      Assessment & Plan:   Problem List Items Addressed This Visit      Cardiovascular and Mediastinum   Hypertension - Primary    Well-controlled        Respiratory   OSA on CPAP    Using machine; BP controlled        Digestive   Vitamin B12 deficiency    Start SL B12        Endocrine   Impaired fasting glucose    Next A1c is due in June        Other   Tobacco dependence    She is going [place] quit at the beach; I am here to help when needed      Alcoholism in recovery [facility])    She is working with her psychiatrist; so glad she has remained clean and sober; she is being vigilant, working hard      Obesity    Discussed her weight loss efforts; reinforced staying active, drinking 64 ounces of water a day; thyroid numbers monitored by endo         Follow up plan: Return in about 5 weeks (around 10/09/2015) for visit and fasting labs.  An after-visit summary was printed and given to the patient  at [place].  Please see the patient instructions  which may contain other information and recommendations beyond what is mentioned above in the assessment and plan.

## 2015-09-01 NOTE — Assessment & Plan Note (Signed)
Discussed her weight loss efforts; reinforced staying active, drinking 64 ounces of water a day; thyroid numbers monitored by endo

## 2015-09-01 NOTE — Assessment & Plan Note (Signed)
She is working with her psychiatrist; so glad she has remained clean and sober; she is being vigilant, working hard

## 2015-09-01 NOTE — Assessment & Plan Note (Signed)
Using machine; BP controlled

## 2015-09-01 NOTE — Assessment & Plan Note (Addendum)
She is going to [place] quit at the beach; I am here to help when needed

## 2015-09-03 ENCOUNTER — Ambulatory Visit: Payer: Commercial Managed Care - HMO

## 2015-09-03 DIAGNOSIS — M5442 Lumbago with sciatica, left side: Secondary | ICD-10-CM | POA: Diagnosis not present

## 2015-09-03 NOTE — Therapy (Signed)
[place] [employer] MAIN [facility] SERVICES [address] [place], [place], [address] Phone: [phone]   Fax:  [phone]  Physical Therapy Treatment  Patient Details  Name: Susan [name] MRN: [id number] Date of Birth: 1961-01-27 Referring Provider: Sharlet Salina  Encounter Date: 09/03/2015      PT End of Session - 09/03/15 0918    Visit Number 5   Number of Visits 13   Date for PT Re-Evaluation 09/30/15   Authorization Type 5/10   PT Start Time 0800   PT Stop Time 0850   PT Time Calculation (min) 50 min   Activity Tolerance Patient tolerated treatment well;No increased pain   Behavior During Therapy [facility] for tasks assessed/performed      Past Medical History  Diagnosis Date  . Thyroid disease     hyperthyroid  . 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 Hepatitis C   . Depression   . Anxiety   . Bipolar 1 disorder ([place])   . History of alcohol abuse   . History of hepatitis C   . Hypertension   . History of MRSA infection 2013  . History of diverticulitis 2013  . H/O suicide attempt     slit wrists  . Osteoporosis   . Arthritis     rheumatoid arthritis  . Sleep apnea     Pt discontinue use of CPAP last year, Dx 3 years ago.   . OSA (obstructive sleep apnea)     managed by Dr. Manuella Ghazi  . Hypothyroidism   . Multinodular thyroid   . Vitamin D deficiency disease   . Vitamin B12 deficiency     Past Surgical History  Procedure Laterality Date  . Salpingoophorectomy Bilateral 11-13-2011  . Breast surgery Left 20 yrs ago  . Cesarean section    . Hernia repair  06/2011, July 2014    Ventral wall repair with Physiomesh  . Bilateral salpingoophorectomy  July 2013    due to abnormal mass    There were no vitals filed for this visit.      Subjective Assessment - 09/03/15 0915    Subjective Pt reports she is doing well on this date. She was able to work for 4 hours in her yard over the weekend which made her very proud. She did have an episode on Tuesday  where she slipped but did not fall. However she feels like she strained her groin. She denies any groine or inner thigh pain currently. No specific questions at this time. She is performing HEP and believes it has been beneficial for her back pain.    Pertinent History Hx of alcohol use but started to decrease beers from 6-8/day to 2-3/day and current smoker 1 pack a day.  Hx of 1 vaginal surgery (with tears/stitches), 1 C-section, umbilical hernia repair. Currently with diverticulitis and  monitoring diet. Unable to leave the house 1x/week due to frequent Type 6 (Bristol Stool Scale) bowel momvents    Limitations Sitting   How long can you sit comfortably? 30   How long can you walk comfortably? 3 blocks   Currently in Pain? Yes   Pain Score 4    Pain Location Back   Pain Orientation Lower        AQUATIC THERAPY  Pt entered/exited pool via steps with UE on rail.  Forward/backward and sidestepping for warm-up x 2 lengths each in 3'6" depth;  Exercises performed in 3'6" depth: Standing hip exercises including marches, extension, abduction, and heel raises 1  min x 2 each; Serpentine mobilizations for low back with patient floating on 2 noodles alternating control from ankles and shoulders x 4 lengths; Standing "tug of war" with pool noodle with cues for TrA contraction 1 min x 2; Kickboard push/pull with TrA contraction 1 minute x 2; Kickboard push downs with TrA contraction 1 minute x 2; Seated bicycle, flutter, and scissor kicks 1 minute each; Seated L HS stretch with pool noodle 30 seconds x 2; Educated pt on exercises he can perform at [facility] with water dumbells; Finished with relaxation floating on 2 pool noodles;                         PT Education - 09/03/15 0917    Education provided Yes   Education Details HEP and activity modification   Person(s) Educated Patient   Methods Explanation   Comprehension Verbalized understanding             PT Long  Term Goals - 08/19/15 0927    PT LONG TERM GOAL #1   Title pt will reduce oswestry score by 15pts to improve function   Baseline 40%   Time 6   Period Weeks   Status New   PT LONG TERM GOAL #2   Title pt will sit with lumbar roll in place x 1 hour without increase in LBP/ leg pain   Time 6   Period Weeks   Status New   PT LONG TERM GOAL #3   Title pt will demonstrate proper lifting mechanics of 15lb item from floor to table x 5 with no inc. pain   Time 6   Period Weeks   Status New   PT LONG TERM GOAL #4   Title pt will be independent and compliant with HEP and independent aquatic exercise.    Time 6   Period Weeks   Status New               Plan - 09/03/15 [id number]    Clinical Impression Statement Pt denies any pain during therapy session. She reports considerable improvement in low back pain since starting therapy. Pt is able to complete all exercises and reports low back relaxation with serpentine mobilizations. Encouraged pt to continue HEP and follow up with land therapist.    Rehab Potential Good   Clinical Impairments Affecting Rehab Potential Prognosis is limited by pt's poor compliance to CPAP use for OSA, alcohol and nicotine use, Hx of abdominal surgeries, and co-morbidities.    PT Frequency 2x / week   PT Duration 6 weeks   PT Treatment/Interventions Electrical Stimulation;Moist Heat;Stair training;Functional mobility training;Therapeutic activities;Therapeutic exercise;Balance training;Dry needling;Manual techniques;Neuromuscular re-education;Patient/family education;Energy conservation;Cryotherapy;Scar mobilization;Aquatic Therapy;Traction;Ultrasound;ADLs/Self Care Home Management   PT Next Visit Plan Continue strengthening and mobility   PT Home Exercise Plan Continue as prescribed   Consulted and Agree with Plan of Care Patient      Patient will benefit from skilled therapeutic intervention in order to improve the following deficits and impairments:  Abnormal  gait, Cardiopulmonary status limiting activity, Decreased mobility, Decreased scar mobility, Increased fascial restricitons, Hypomobility, Postural dysfunction, Improper body mechanics, Decreased activity tolerance, Obesity, Other (comment), Pain, Decreased range of motion, Decreased strength  Visit Diagnosis: Lumbago with sciatica, left side     Problem List Patient Active Problem List   Diagnosis Date Noted  . Obesity 08/06/2015  . Pain in right knee 08/03/2015  . Postmenopausal 07/08/2015  . Preventative health care 07/08/2015  . Cervical  cancer screening 07/08/2015  . Alcoholism in recovery ([place]) 04/13/2015  . Lymphocytosis 04/10/2015  . Impaired fasting glucose 04/10/2015  . Need for Streptococcus pneumoniae and influenza vaccination 04/09/2015  . Polyuria 04/09/2015  . Vitamin D deficiency 04/09/2015  . Vitamin B12 deficiency 04/09/2015  . Medication monitoring encounter 04/09/2015  . OSA on CPAP 04/09/2015  . Breast cancer screening 04/09/2015  . Subclinical hyperthyroidism 06/26/2014  . Toxic multinodular goiter 06/26/2014  . Hypertension 06/26/2014  . Hepatitis C 06/26/2014  . Tobacco dependence 06/26/2014  . Bipolar disorder ([place]) 06/26/2014  . Recurrent ventral hernia 11/08/2012   Phillips Grout PT, DPT   Huprich,Jason 09/03/2015, 9:26 AM  [place] MAIN [facility] SERVICES [address] [place], [place], [address] Phone: [phone]   Fax:  [phone]  Name: Susan [name] MRN: [id number] Date of Birth: 09-Apr-1961

## 2015-09-14 ENCOUNTER — Ambulatory Visit: Payer: Commercial Managed Care - HMO | Attending: Physical Medicine and Rehabilitation

## 2015-09-14 DIAGNOSIS — R2689 Other abnormalities of gait and mobility: Secondary | ICD-10-CM | POA: Insufficient documentation

## 2015-09-14 DIAGNOSIS — X58XXXS Exposure to other specified factors, sequela: Secondary | ICD-10-CM | POA: Insufficient documentation

## 2015-09-14 DIAGNOSIS — R279 Unspecified lack of coordination: Secondary | ICD-10-CM | POA: Diagnosis not present

## 2015-09-14 DIAGNOSIS — M5442 Lumbago with sciatica, left side: Secondary | ICD-10-CM

## 2015-09-14 DIAGNOSIS — S39001S Unspecified injury of muscle, fascia and tendon of abdomen, sequela: Secondary | ICD-10-CM | POA: Diagnosis not present

## 2015-09-14 NOTE — Therapy (Signed)
[place] MAIN [facility] SERVICES [address] [place], [place], [address] Phone: [phone]   Fax:  [phone]  Physical Therapy Treatment  Patient Details  Name: Susan [name] MRN: [id number] Date of Birth: January 06, 1961 Referring Provider: Sharlet Salina  Encounter Date: 09/14/2015      PT End of Session - 09/14/15 0838    Visit Number 6   Number of Visits 13   Date for PT Re-Evaluation 09/30/15   Authorization Type 6/10   PT Start Time 0800   PT Stop Time 0835   PT Time Calculation (min) 35 min   Activity Tolerance Patient tolerated treatment well;No increased pain   Behavior During Therapy [facility] for tasks assessed/performed      Past Medical History  Diagnosis Date  . Thyroid disease     hyperthyroid  . 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 Hepatitis C   . Depression   . Anxiety   . Bipolar 1 disorder ([place])   . History of alcohol abuse   . History of hepatitis C   . Hypertension   . History of MRSA infection 2013  . History of diverticulitis 2013  . H/O suicide attempt     slit wrists  . Osteoporosis   . Arthritis     rheumatoid arthritis  . Sleep apnea     Pt discontinue use of CPAP last year, Dx 3 years ago.   . OSA (obstructive sleep apnea)     managed by Dr. Manuella Ghazi  . Hypothyroidism   . Multinodular thyroid   . Vitamin D deficiency disease   . Vitamin B12 deficiency     Past Surgical History  Procedure Laterality Date  . Salpingoophorectomy Bilateral 11-13-2011  . Breast surgery Left 20 yrs ago  . Cesarean section    . Hernia repair  06/2011, July 2014    Ventral wall repair with Physiomesh  . Bilateral salpingoophorectomy  July 2013    due to abnormal mass    There were no vitals filed for this visit.      Subjective Assessment - 09/14/15 0811    Subjective pt reports she has been at the beach the past week. she reports her lower back had been doing pretty well. she has little pain.    Pertinent History Hx of alcohol use  but started to decrease beers from 6-8/day to 2-3/day and current smoker 1 pack a day.  Hx of 1 vaginal surgery (with tears/stitches), 1 C-section, umbilical hernia repair. Currently with diverticulitis and  monitoring diet. Unable to leave the house 1x/week due to frequent Type 6 (Bristol Stool Scale) bowel momvents    Limitations Sitting   How long can you sit comfortably? 30   How long can you walk comfortably? 3 blocks   Currently in Pain? Yes   Pain Score 1    Pain Location Back   Pain Orientation Lower   Pain Descriptors / Indicators Aching      Therex: LTR with T ball x 2 min Tball bridge 2x10 Isometric ball crunch 3s 2x10 Low row red band 2x10 Pavlov press red band x 10 reach side Squat (with bench behind) 2x10 Mod cues for Diaphragmatic breathing (initiating TA contraction) during exercises.                             PT Education - 09/14/15 [phone]    Education provided Yes   Education Details diaphragmatic breathing duirng exercises  Person(s) Educated Patient   Methods Explanation   Comprehension Verbalized understanding             PT Long Term Goals - 08/19/15 0927    PT LONG TERM GOAL #1   Title pt will reduce oswestry score by 15pts to improve function   Baseline 40%   Time 6   Period Weeks   Status New   PT LONG TERM GOAL #2   Title pt will sit with lumbar roll in place x 1 hour without increase in LBP/ leg pain   Time 6   Period Weeks   Status New   PT LONG TERM GOAL #3   Title pt will demonstrate proper lifting mechanics of 15lb item from floor to table x 5 with no inc. pain   Time 6   Period Weeks   Status New   PT LONG TERM GOAL #4   Title pt will be independent and compliant with HEP and independent aquatic exercise.    Time 6   Period Weeks   Status New               Plan - 09/14/15 [id number]    Clinical Impression Statement pt continues to do well regarding her pain relief in the lower back and progression  of core and posural strengthening. pt does need mod cues for diaphragmatic breathing during exercises. initiated lifting mechanics with simple squat today without issue.    Rehab Potential Good   Clinical Impairments Affecting Rehab Potential Prognosis is limited by pt's poor compliance to CPAP use for OSA, alcohol and nicotine use, Hx of abdominal surgeries, and co-morbidities.    PT Frequency 2x / week   PT Duration 6 weeks   PT Treatment/Interventions Electrical Stimulation;Moist Heat;Stair training;Functional mobility training;Therapeutic activities;Therapeutic exercise;Balance training;Dry needling;Manual techniques;Neuromuscular re-education;Patient/family education;Energy conservation;Cryotherapy;Scar mobilization;Aquatic Therapy;Traction;Ultrasound;ADLs/Self Care Home Management   PT Next Visit Plan Continue strengthening and mobility   PT Home Exercise Plan Continue as prescribed   Consulted and Agree with Plan of Care Patient      Patient will benefit from skilled therapeutic intervention in order to improve the following deficits and impairments:  Abnormal gait, Cardiopulmonary status limiting activity, Decreased mobility, Decreased scar mobility, Increased fascial restricitons, Hypomobility, Postural dysfunction, Improper body mechanics, Decreased activity tolerance, Obesity, Other (comment), Pain, Decreased range of motion, Decreased strength  Visit Diagnosis: Lumbago with sciatica, left side     Problem List Patient Active Problem List   Diagnosis Date Noted  . Obesity 08/06/2015  . Pain in right knee 08/03/2015  . Postmenopausal 07/08/2015  . Preventative health care 07/08/2015  . Cervical cancer screening 07/08/2015  . Alcoholism in recovery ([place]) 04/13/2015  . Lymphocytosis 04/10/2015  . Impaired fasting glucose 04/10/2015  . Need for Streptococcus pneumoniae and influenza vaccination 04/09/2015  . Polyuria 04/09/2015  . Vitamin D deficiency 04/09/2015  . Vitamin  B12 deficiency 04/09/2015  . Medication monitoring encounter 04/09/2015  . OSA on CPAP 04/09/2015  . Breast cancer screening 04/09/2015  . Subclinical hyperthyroidism 06/26/2014  . Toxic multinodular goiter 06/26/2014  . Hypertension 06/26/2014  . Hepatitis C 06/26/2014  . Tobacco dependence 06/26/2014  . Bipolar disorder ([place]) 06/26/2014  . Recurrent ventral hernia 11/08/2012   Gorden Harms. Santo Zahradnik, PT, DPT [phone]  Jakaden Ouzts 09/14/2015, 8:40 AM  [place] MAIN [facility] SERVICES [address] [place], [place], [address] Phone: [phone]   Fax:  [phone]  Name: Susan [name] MRN: [id number] Date of Birth: February 23, 1961

## 2015-09-17 ENCOUNTER — Ambulatory Visit: Payer: Commercial Managed Care - HMO | Admitting: Physical Therapy

## 2015-09-17 DIAGNOSIS — M5442 Lumbago with sciatica, left side: Secondary | ICD-10-CM

## 2015-09-17 DIAGNOSIS — S39001S Unspecified injury of muscle, fascia and tendon of abdomen, sequela: Secondary | ICD-10-CM

## 2015-09-17 DIAGNOSIS — R2689 Other abnormalities of gait and mobility: Secondary | ICD-10-CM

## 2015-09-17 DIAGNOSIS — R279 Unspecified lack of coordination: Secondary | ICD-10-CM

## 2015-09-17 NOTE — Therapy (Signed)
[place] MAIN [facility] SERVICES [address] [place], [place], [address] Phone: [phone]   Fax:  [phone]  Physical Therapy Treatment  Patient Details  Name: Susan [name] MRN: [id number] Date of Birth: 30-Jul-1960 Referring Provider: Sharlet Salina  Encounter Date: 09/17/2015      PT End of Session - 09/17/15 0830    Visit Number 7   Number of Visits 13   Date for PT Re-Evaluation 09/30/15   Authorization Type 7/10   PT Start Time 0802   PT Stop Time [id number]   PT Time Calculation (min) 35 min   Activity Tolerance Patient tolerated treatment well;No increased pain   Behavior During Therapy [facility] for tasks assessed/performed      Past Medical History  Diagnosis Date  . Thyroid disease     hyperthyroid  . 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 Hepatitis C   . Depression   . Anxiety   . Bipolar 1 disorder ([place])   . History of alcohol abuse   . History of hepatitis C   . Hypertension   . History of MRSA infection 2013  . History of diverticulitis 2013  . H/O suicide attempt     slit wrists  . Osteoporosis   . Arthritis     rheumatoid arthritis  . Sleep apnea     Pt discontinue use of CPAP last year, Dx 3 years ago.   . OSA (obstructive sleep apnea)     managed by Dr. Manuella Ghazi  . Hypothyroidism   . Multinodular thyroid   . Vitamin D deficiency disease   . Vitamin B12 deficiency     Past Surgical History  Procedure Laterality Date  . Salpingoophorectomy Bilateral 11-13-2011  . Breast surgery Left 20 yrs ago  . Cesarean section    . Hernia repair  06/2011, July 2014    Ventral wall repair with Physiomesh  . Bilateral salpingoophorectomy  July 2013    due to abnormal mass    There were no vitals filed for this visit.      Subjective Assessment - 09/17/15 0812    Subjective Pt reports her back is better but she feels pain in her groin hip area when walking on concrete. Pt spoke of wanting to ask her MD for an MRI. Upon further questioning  by PT, pt states she has had multiple abdominal surgeries (tubal ligation, C-section, 3 hernial repairs).  Pt states she has experienced pulling pain near the hernia with overhead reaching. Pt also states her hernia pops out when coughing. Pt has tried to workout to get rid of her lowe ab pooch but has not had sucess. Pt also states also has felt a loss of sensation around her belly button since her surgeries.      Pertinent History Hx of alcohol use but started to decrease beers from 6-8/day to 2-3/day and current smoker 1 pack a day.  Hx of 1 vaginal surgery (with tears/stitches), 1 C-section, umbilical hernia repair. Currently with diverticulitis and  monitoring diet. Unable to leave the house 1x/week due to frequent Type 6 (Bristol Stool Scale) bowel momvents    Limitations Sitting   How long can you sit comfortably? 30   How long can you walk comfortably? 3 blocks                     Adult Aquatic Therapy - 09/17/15 0822    Aquatic Therapy Subjective   Subjective Pt reported no complaints with today's  session.      O: O: Pt entered/exited the pool via steps with single UE support on rail. Exercises performed in 3'6" depth  50 ft =1 lap  2 laps: walking forward/ backward   w/ blue pool floor for proprioception 2 laps: forward lunges with proper knee alignment 2 laps: forward lunges with dumbbells in hand  2 laps backward walking to increase hip extension, eccentric control of gastroc 2 laps side step squat with education to apply mini squat with ADLs instead of bending 2 laps side step + squat with dumbbell to push through water  5 reps of reverse twist (front lunge) excessive cues for alignment/ propioception                PT Education - 09/17/15 0824    Education provided Yes   Education Details educated pt about the possible role of abdominal scar adhesions on her groin /hip pain. Pt states interest in seeing Pelvic Health PT for few visits to address  this issue and to help pt achieve less pain when walking on pavement.      Person(s) Educated Patient   Methods Explanation;Demonstration;Tactile cues;Verbal cues   Comprehension Returned demonstration;Verbalized understanding             PT Long Term Goals - 09/17/15 0848    PT LONG TERM GOAL #1   Title pt will reduce oswestry score by 15pts to improve function   Baseline 40%   Time 6   Period Weeks   Status On-going   PT LONG TERM GOAL #2   Title pt will sit with lumbar roll in place x 1 hour without increase in LBP/ leg pain   Time 6   Period Weeks   Status On-going   PT LONG TERM GOAL #3   Title pt will demonstrate proper lifting mechanics of 15lb item from floor to table x 5 with no inc. pain   Time 6   Period Weeks   Status On-going   PT LONG TERM GOAL #4   Title pt will be independent and compliant with HEP and independent aquatic exercise.    Time 6   Period Weeks   Status On-going           Plan - 09/17/15 1012    Clinical Impression Statement Pt progressed to dynamic balance exercises without difficulty. Exercises today focused on hip extension /balance as well as trunk rotation. Pt continued to require cues for decreased lumbar lordosis due to increased abdominal adipose and weak core and poor deep core coordination. Educated pt to engage abdominal mm when coughing to minimize worsening of hernia. Discussed the benefit for pelvic health PT to address scar adhesions on abdomen and hernias that may be  associated with her groin/hip pain. Pt will continue to benefit from skilled PT.    Rehab Potential Good   Clinical Impairments Affecting Rehab Potential Prognosis is limited by pt's poor compliance to CPAP use for OSA, alcohol and nicotine use, Hx of abdominal surgeries, and co-morbidities.    PT Frequency 2x / week   PT Duration 6 weeks   PT Treatment/Interventions Electrical Stimulation;Moist Heat;Stair training;Functional mobility training;Therapeutic  activities;Therapeutic exercise;Balance training;Dry needling;Manual techniques;Neuromuscular re-education;Patient/family education;Energy conservation;Cryotherapy;Scar mobilization;Aquatic Therapy;Traction;Ultrasound;ADLs/Self Care Home Management   PT Next Visit Plan Continue strengthening and mobility   PT Home Exercise Plan Continue as prescribed   Consulted and Agree with Plan of Care Patient      Patient will benefit from skilled therapeutic intervention in order  to improve the following deficits and impairments:  Abnormal gait, Cardiopulmonary status limiting activity, Decreased mobility, Decreased scar mobility, Increased fascial restricitons, Hypomobility, Postural dysfunction, Improper body mechanics, Decreased activity tolerance, Obesity, Other (comment), Pain, Decreased range of motion, Decreased strength  Visit Diagnosis: Lumbago with sciatica, left side  Lack of coordination  Decreased mobility  Unspecified injury of muscle, fascia and tendon of abdomen, sequela     Problem List Patient Active Problem List   Diagnosis Date Noted  . Obesity 08/06/2015  . Pain in right knee 08/03/2015  . Postmenopausal 07/08/2015  . Preventative health care 07/08/2015  . Cervical cancer screening 07/08/2015  . Alcoholism in recovery ([place]) 04/13/2015  . Lymphocytosis 04/10/2015  . Impaired fasting glucose 04/10/2015  . Need for Streptococcus pneumoniae and influenza vaccination 04/09/2015  . Polyuria 04/09/2015  . Vitamin D deficiency 04/09/2015  . Vitamin B12 deficiency 04/09/2015  . Medication monitoring encounter 04/09/2015  . OSA on CPAP 04/09/2015  . Breast cancer screening 04/09/2015  . Subclinical hyperthyroidism 06/26/2014  . Toxic multinodular goiter 06/26/2014  . Hypertension 06/26/2014  . Hepatitis C 06/26/2014  . Tobacco dependence 06/26/2014  . Bipolar disorder ([place]) 06/26/2014  . Recurrent ventral hernia 11/08/2012    Jerl Mina ,PT, DPT,  E-RYT  09/17/2015, 10:15 AM  [place] MAIN [facility] SERVICES [address] [place], [place], [address] Phone: [phone]   Fax:  [phone]  Name: Susan [name] MRN: [id number] Date of Birth: November 21, 1960    3

## 2015-09-21 ENCOUNTER — Ambulatory Visit: Payer: Commercial Managed Care - HMO

## 2015-09-21 DIAGNOSIS — R279 Unspecified lack of coordination: Secondary | ICD-10-CM | POA: Diagnosis not present

## 2015-09-21 DIAGNOSIS — M5442 Lumbago with sciatica, left side: Secondary | ICD-10-CM

## 2015-09-21 DIAGNOSIS — R2689 Other abnormalities of gait and mobility: Secondary | ICD-10-CM | POA: Diagnosis not present

## 2015-09-21 DIAGNOSIS — S39001S Unspecified injury of muscle, fascia and tendon of abdomen, sequela: Secondary | ICD-10-CM | POA: Diagnosis not present

## 2015-09-21 NOTE — Therapy (Signed)
[place] MAIN [facility] SERVICES [address] [place], [place], [address] Phone: [phone]   Fax:  [phone]  Physical Therapy Treatment / Progress note  Patient Details  Name: DEMIAH SLIGH MRN: [id number] Date of Birth: September 01, 1960 Referring Provider: Sharlet Salina  Encounter Date: 09/21/2015      PT End of Session - 09/21/15 0823    Visit Number 8   Number of Visits 13   Date for PT Re-Evaluation 09/30/15   Authorization Type 8/10   PT Start Time 0802   PT Stop Time 0845   PT Time Calculation (min) 43 min   Equipment Utilized During Treatment Other (comment)  Tball   Activity Tolerance Patient tolerated treatment well;No increased pain   Behavior During Therapy [facility] for tasks assessed/performed      Past Medical History  Diagnosis Date  . Thyroid disease     hyperthyroid  . 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 Hepatitis C   . Depression   . Anxiety   . Bipolar 1 disorder ([place])   . History of alcohol abuse   . History of hepatitis C   . Hypertension   . History of MRSA infection 2013  . History of diverticulitis 2013  . H/O suicide attempt     slit wrists  . Osteoporosis   . Arthritis     rheumatoid arthritis  . Sleep apnea     Pt discontinue use of CPAP last year, Dx 3 years ago.   . OSA (obstructive sleep apnea)     managed by Dr. Manuella Ghazi  . Hypothyroidism   . Multinodular thyroid   . Vitamin D deficiency disease   . Vitamin B12 deficiency     Past Surgical History  Procedure Laterality Date  . Salpingoophorectomy Bilateral 11-13-2011  . Breast surgery Left 20 yrs ago  . Cesarean section    . Hernia repair  06/2011, July 2014    Ventral wall repair with Physiomesh  . Bilateral salpingoophorectomy  July 2013    due to abnormal mass    There were no vitals filed for this visit.      Subjective Assessment - 09/21/15 0819    Subjective pt reports her back pain has been pretty under control. she reports only taking one pain  pill last week. she has brought in her Tball from home to blow up.    Pertinent History Hx of alcohol use but started to decrease beers from 6-8/day to 2-3/day and current smoker 1 pack a day.  Hx of 1 vaginal surgery (with tears/stitches), 1 C-section, umbilical hernia repair. Currently with diverticulitis and  monitoring diet. Unable to leave the house 1x/week due to frequent Type 6 (Bristol Stool Scale) bowel momvents    Limitations Sitting   How long can you sit comfortably? 30   How long can you walk comfortably? 3 blocks   Currently in Pain? Yes   Pain Score 2    Pain Location Back   Pain Orientation Lower   Pain Descriptors / Indicators Tightness;Aching   Pain Type Chronic pain         Therex:all with Tball LTR with Tball x 3 min 5 s hold on each side Isometric ball crunch 5s 2x10 Isometric LTR 5s x 10 each side Pavlov press red band x 15 each side Low row red band 2x15 Bridge 2x10 Seated 07-22-22 2x10 Dead bug 2x10 Pt requires min verbal and tactile cues for proper exercise performance   Diaphragmatic breathing (initiating  TA contraction) for all exercises                        PT Education - 10/08/2015 0820    Education provided Yes   Education Details min cues for diaphragmatic brething   Person(s) Educated Patient   Methods Explanation   Comprehension Verbalized understanding             PT Long Term Goals - 10/08/2015 [id number]    PT LONG TERM GOAL #1   Title pt will reduce oswestry score by 15pts to improve function   Baseline 40%   Time 6   Period Weeks   Status On-going   PT LONG TERM GOAL #2   Title pt will sit with lumbar roll in place x 1 hour without increase in LBP/ leg pain   Time 6   Period Weeks   Status Achieved   PT LONG TERM GOAL #3   Title pt will demonstrate proper lifting mechanics of 15lb item from floor to table x 5 with no inc. pain   Time 6   Period Weeks   Status On-going   PT LONG TERM GOAL #4   Title pt will be  independent and compliant with HEP and independent aquatic exercise.    Time 6   Period Weeks   Status On-going   PT LONG TERM GOAL #5   Time 6               Plan - October 08, 2015 0841    Clinical Impression Statement pt did well with progression of core strengthening into sitting on Tball. she has good HEP compliance and is having much less frequent pain and less intense pain.    Rehab Potential Good   Clinical Impairments Affecting Rehab Potential Prognosis is limited by pt's poor compliance to CPAP use for OSA, alcohol and nicotine use, Hx of abdominal surgeries, and co-morbidities.    PT Frequency 2x / week   PT Duration 6 weeks   PT Treatment/Interventions Electrical Stimulation;Moist Heat;Stair training;Functional mobility training;Therapeutic activities;Therapeutic exercise;Balance training;Dry needling;Manual techniques;Neuromuscular re-education;Patient/family education;Energy conservation;Cryotherapy;Scar mobilization;Aquatic Therapy;Traction;Ultrasound;ADLs/Self Care Home Management   PT Next Visit Plan Continue strengthening and mobility   PT Home Exercise Plan Continue as prescribed   Consulted and Agree with Plan of Care Patient      Patient will benefit from skilled therapeutic intervention in order to improve the following deficits and impairments:  Abnormal gait, Cardiopulmonary status limiting activity, Decreased mobility, Decreased scar mobility, Increased fascial restricitons, Hypomobility, Postural dysfunction, Improper body mechanics, Decreased activity tolerance, Obesity, Other (comment), Pain, Decreased range of motion, Decreased strength  Visit Diagnosis: Lumbago with sciatica, left side - Plan: PT plan of care cert/re-cert       G-Codes - Oct 08, 2015 [id number]    Functional Assessment Tool Used ROM, NRPS, clinical judgement    Functional Limitation Mobility: Walking and moving around   Mobility: Walking and Moving Around Current Status [id number]) At least 20 percent  but less than 40 percent impaired, limited or restricted   Mobility: Walking and Moving Around Goal Status [phone]) At least 1 percent but less than 20 percent impaired, limited or restricted      Problem List Patient Active Problem List   Diagnosis Date Noted  . Obesity 08/06/2015  . Pain in right knee 08/03/2015  . Postmenopausal 07/08/2015  . Preventative health care 07/08/2015  . Cervical cancer screening 07/08/2015  . Alcoholism in recovery ([place]) 04/13/2015  . Lymphocytosis 04/10/2015  .  Impaired fasting glucose 04/10/2015  . Need for Streptococcus pneumoniae and influenza vaccination 04/09/2015  . Polyuria 04/09/2015  . Vitamin D deficiency 04/09/2015  . Vitamin B12 deficiency 04/09/2015  . Medication monitoring encounter 04/09/2015  . OSA on CPAP 04/09/2015  . Breast cancer screening 04/09/2015  . Subclinical hyperthyroidism 06/26/2014  . Toxic multinodular goiter 06/26/2014  . Hypertension 06/26/2014  . Hepatitis C 06/26/2014  . Tobacco dependence 06/26/2014  . Bipolar disorder ([place]) 06/26/2014  . Recurrent ventral hernia 11/08/2012   Gorden Harms. Mckenzy Salazar, PT, DPT [phone]  Emmalise Huard 09/21/2015, 8:44 AM  [place] MAIN [facility] SERVICES [address] [place], [place], [address] Phone: [phone]   Fax:  [phone]  Name: ARSHIKA TAMS MRN: [id number] Date of Birth: 18-Mar-1961

## 2015-09-24 ENCOUNTER — Ambulatory Visit: Payer: Commercial Managed Care - HMO | Admitting: Physical Therapy

## 2015-09-24 ENCOUNTER — Encounter: Payer: Self-pay | Admitting: Family Medicine

## 2015-09-24 ENCOUNTER — Ambulatory Visit (INDEPENDENT_AMBULATORY_CARE_PROVIDER_SITE_OTHER): Payer: Commercial Managed Care - HMO | Admitting: Family Medicine

## 2015-09-24 VITALS — BP 120/72 | HR 85 | Temp 98.0°F | Resp 16 | Wt 216.0 lb

## 2015-09-24 DIAGNOSIS — R279 Unspecified lack of coordination: Secondary | ICD-10-CM | POA: Diagnosis not present

## 2015-09-24 DIAGNOSIS — M5442 Lumbago with sciatica, left side: Secondary | ICD-10-CM | POA: Diagnosis not present

## 2015-09-24 DIAGNOSIS — M1731 Unilateral post-traumatic osteoarthritis, right knee: Secondary | ICD-10-CM | POA: Diagnosis not present

## 2015-09-24 DIAGNOSIS — S39001S Unspecified injury of muscle, fascia and tendon of abdomen, sequela: Secondary | ICD-10-CM

## 2015-09-24 DIAGNOSIS — F17200 Nicotine dependence, unspecified, uncomplicated: Secondary | ICD-10-CM

## 2015-09-24 DIAGNOSIS — R2689 Other abnormalities of gait and mobility: Secondary | ICD-10-CM | POA: Diagnosis not present

## 2015-09-24 HISTORY — DX: Unilateral post-traumatic osteoarthritis, right knee: M17.31

## 2015-09-24 MED ORDER — ACE KNEE BRACE W/STABILIZERS MISC: Status: DC

## 2015-09-24 MED ORDER — CANE MISC: Status: DC

## 2015-09-24 NOTE — Progress Notes (Signed)
BP 120/72 mmHg  Pulse 85  Temp(Src) 98 F (36.7 C) (Oral)  Resp 16  Wt 216 lb (97.977 kg)  SpO2 96%   Subjective:    Patient ID: Susan [name], female    DOB: [birth year], 55 y.o.   MRN: [id number]  HPI: [name] is a 55 y.o. female  Chief Complaint  Patient presents with  . Referral    right knee pain    She has been having pain in the right knee; started in February; feels like it is dislocated and is afraid she'll fall Pain is excruciating First she went to [facility] urgent care, note reviewed, pes aserine bursitis; was given prednisone; did not go well with her mood; saturated with it and was mean and hyper and suicidal but knew what was going on, that it was the prednisone She went to the ER, March 23rd Had xrays and was found to have tricompartmental arthritis Acute tricompartmental changes degenerative changes to the right knee. Rx given for meloxicam 15 mg daily and Percocet 5/325. She'll follow up with orthopedic surgeon for continued evaluation. then went to ortho, Dr. Jeneen Rinks McGhee;note reviewed Primary OA of right knee; fell on it as a child, tripped and kept hitting it and landing on it; old injuries They wanted to have her take meloxicam; knee steroid injection; elevate and ice, repeat injection in 3 months if needed No MRIs have been done Feels like it going to give way She saw [facility] and they put a shot in the right knee; March 2017; she did get some benefit The inner aspect of the knee is what hurts; radiates down the leg in to the right foot; points along the anteromedial  She is not able to walk 300 feet without stopping to rest and asked about a handicapped sticker; she may bring the form back in Not using cane or brace  Meds reviewed and updated by MD: Tramadol from Dr. Sharlet Salina Oxycodone from [facility] Poot, only for worst pain, few left over from March Flexeril, two every night before bed; rxd by Dr. Sharlet Salina Klonopin from Dr. Leonides Schanz, and he  is going to wean them at next visit; manic and sad after deaths; talks through it with her therapist too I asked if she was aware of the risk of benzo plus opioids and she said, "Yes, you gave me a paper"  Depression screen [facility] 2/9 09/24/2015 09/01/2015 07/08/2015  Decreased Interest 3 0 0  Down, Depressed, Hopeless 1 1 3   PHQ - 2 Score 4 1 3   Altered sleeping 3 - 3  Tired, decreased energy 3 - 0  Change in appetite 3 - 3  Feeling bad or failure about yourself  1 - 0  Trouble concentrating 3 - 0  Moving slowly or fidgety/restless 3 - 0  Suicidal thoughts 0 - 1  PHQ-9 Score 20 - 10  Difficult doing work/chores Somewhat difficult - Somewhat difficult  she has crisis numbers; has psychiatrist, has counselor  Relevant past medical, surgical, family and social history reviewed Past Medical History  Diagnosis Date  . Thyroid disease     hyperthyroid  . 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 Hepatitis C   . Depression   . Anxiety   . Bipolar 1 disorder ([place])   . History of alcohol abuse   . History of hepatitis C   . Hypertension   . History of MRSA infection 2013  . History of diverticulitis 2013  . H/O suicide attempt  slit wrists  . Osteoporosis   . Arthritis     rheumatoid arthritis  . Sleep apnea     Pt discontinue use of CPAP last year, Dx 3 years ago.   . OSA (obstructive sleep apnea)     managed by Dr. Manuella Ghazi  . Hypothyroidism   . Multinodular thyroid   . Vitamin D deficiency disease   . Vitamin B12 deficiency   . Post-traumatic osteoarthritis of right knee 09/24/2015   Past Surgical History  Procedure Laterality Date  . Salpingoophorectomy Bilateral 11-13-2011  . Breast surgery Left 20 yrs ago  . Cesarean section    . Hernia repair  06/2011, July 2014    Ventral wall repair with Physiomesh  . Bilateral salpingoophorectomy  July 2013    due to abnormal mass   Social History  Substance Use Topics  . Smoking status: Current Every Day Smoker -- 0.50 packs/day for 30 years     Types: Cigarettes  . Smokeless tobacco: Never Used  . Alcohol Use: No   Interim medical history since last visit reviewed. Allergies and medications reviewed  Review of Systems Per HPI unless specifically indicated above     Objective:    BP 120/72 mmHg  Pulse 85  Temp(Src) 98 F (36.7 C) (Oral)  Resp 16  Wt 216 lb (97.977 kg)  SpO2 96%  Wt Readings from Last 3 Encounters:  09/24/15 216 lb (97.977 kg)  09/01/15 216 lb (97.977 kg)  08/03/15 218 lb (98.884 kg)    Physical Exam  Constitutional: She appears well-developed and well-nourished.  Cardiovascular: Normal rate.   Pulmonary/Chest: Effort normal.  Musculoskeletal:       Right knee: She exhibits decreased range of motion, swelling, effusion and MCL laxity. She exhibits no LCL laxity, normal patellar mobility and no bony tenderness. Tenderness found. MCL tenderness noted. No LCL and no patellar tendon tenderness noted.  Psychiatric: Her mood appears not anxious. Her speech is not rapid and/or pressured and not tangential. She is not agitated, not aggressive, not hyperactive and not combative. She does not exhibit a depressed mood.  Good eye contact with examiner; not despondent; not tearful    Results for orders placed or performed in visit on 07/08/15  Pap liquid-based and HPV (high risk)  Result Value Ref Range   DIAGNOSIS: Comment    Specimen adequacy: Comment    CLINICIAN PROVIDED ICD10: Comment    Performed by: Comment    QC reviewed by: Comment    PAP SMEAR COMMENT .    Note: Comment    HPV, high-risk Negative Negative      Assessment & Plan:   Problem List Items Addressed This Visit      Musculoskeletal and Integument   Post-traumatic osteoarthritis of right knee - Primary    Refer back to orthopaedist; knee brace with stabilizers ordered; use cane; I'm willing to sign handicapped parking sticker for 3 months, patient may bring by and I'll sign; continue meloxicam; dangers of opioids and benzos within  six hours especially can be fatal      Relevant Orders   Ambulatory referral to Orthopedic Surgery     Other   Tobacco dependence    She admits that she is not ready to quit quite yet; I am here to help if/when ready; see AVS          Follow up plan: Return if symptoms worsen or fail to improve.  An after-visit summary was printed and given to the patient at  check-out.  Please see the patient instructions which may contain other information and recommendations beyond what is mentioned above in the assessment and plan.  Meds ordered this encounter  Medications  . Elastic Bandages & Supports (ACE KNEE BRACE W/STABILIZERS) MISC    Sig: Knee brace with stabilizers, RIGHT knee; suspect internal derangement    Dispense:  1 each    Refill:  0  . Misc. Devices (CANE) MISC    Sig: Right knee instability; use for stability    Dispense:  1 each    Refill:  0    Orders Placed This Encounter  Procedures  . Ambulatory referral to Orthopedic Surgery

## 2015-09-24 NOTE — Assessment & Plan Note (Signed)
She admits that she is not ready to quit quite yet; I am here to help if/when ready; see AVS

## 2015-09-24 NOTE — Assessment & Plan Note (Signed)
Refer back to orthopaedist; knee brace with stabilizers ordered; use cane; I'm willing to sign handicapped parking sticker for 3 months, patient may bring by and I'll sign; continue meloxicam; dangers of opioids and benzos within six hours especially can be fatal

## 2015-09-24 NOTE — Patient Instructions (Addendum)
We'll have you get back in to see the orthopaedist You can use the knee brace and the cane I do encourage you to quit smoking Call [phone] to sign up for smoking cessation classes You can call 1-800-QUIT-NOW to talk with a smoking cessation coach Continue to work with psychiatrist and therapist

## 2015-09-25 NOTE — Therapy (Signed)
[place] MAIN [facility] SERVICES [address] [place], [place], [address] Phone: [phone]   Fax:  [phone]  Physical Therapy Treatment  Patient Details  Name: Susan [name] MRN: [id number] Date of Birth: 17-Dec-1960 Referring Provider: Sharlet Salina  Encounter Date: 09/24/2015      PT End of Session - 09/25/15 2210    Visit Number 9   Number of Visits 13   Date for PT Re-Evaluation 09/30/15   Authorization Type 9/10   Equipment Utilized During Treatment Other (comment)  Tball   Activity Tolerance Patient tolerated treatment well;No increased pain   Behavior During Therapy [facility] for tasks assessed/performed      Past Medical History  Diagnosis Date  . Thyroid disease     hyperthyroid  . 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 Hepatitis C   . Depression   . Anxiety   . Bipolar 1 disorder ([place])   . History of alcohol abuse   . History of hepatitis C   . Hypertension   . History of MRSA infection 2013  . History of diverticulitis 2013  . H/O suicide attempt     slit wrists  . Osteoporosis   . Arthritis     rheumatoid arthritis  . Sleep apnea     Pt discontinue use of CPAP last year, Dx 3 years ago.   . OSA (obstructive sleep apnea)     managed by Dr. Manuella Ghazi  . Hypothyroidism   . Multinodular thyroid   . Vitamin D deficiency disease   . Vitamin B12 deficiency   . Post-traumatic osteoarthritis of right knee 09/24/2015    Past Surgical History  Procedure Laterality Date  . Salpingoophorectomy Bilateral 11-13-2011  . Breast surgery Left 20 yrs ago  . Cesarean section    . Hernia repair  06/2011, July 2014    Ventral wall repair with Physiomesh  . Bilateral salpingoophorectomy  July 2013    due to abnormal mass    There were no vitals filed for this visit.      Subjective Assessment - 09/25/15 2210    Subjective Pt reported R knee swelling and increased pain to the point where she wanted to see her orthopedic MD but she was told she had  to see her PCP first. Pt stated her R knee pain feels like her knee cap "shifts off and catches" and she has felt like she might fall. Pt continues to find her PT exercises to help her do more activities like mowing her lawn across two days which is something she has not been able to do before.     Pertinent History Hx of alcohol use but started to decrease beers from 6-8/day to 2-3/day and current smoker 1 pack a day.  Hx of 1 vaginal surgery (with tears/stitches), 1 C-section, umbilical hernia repair. Currently with diverticulitis and  monitoring diet. Unable to leave the house 1x/week due to frequent Type 6 (Bristol Stool Scale) bowel momvents    Limitations Sitting   How long can you sit comfortably? 30   How long can you walk comfortably? 3 blocks                     Adult Aquatic Therapy - 09/25/15 2210    Aquatic Therapy Subjective   Subjective PT reported the gait training cues helped to relieve her back and knee pain.         O:O: Pt entered/exited the pool via steps with single UE  support on rail. Exercises performed in 3'6" depth  50 ft =1 lap  4 laps forward walking with cues for anterior COM and forefoot striking and less heel striking 4 laps back walking  180 deg turns with mechanics to minimize genu valgus  10 x2 mini squats  10 B  3 way hip ext with UE support on rail Stretches:  Reverse warrior stretch with rail B 30 sec   5 steps with single UE on rail with 45 deg turn and "up with the good, down with the bad"    relaxation over noodles 5'             PT Long Term Goals - 09/21/15 [id number]    PT LONG TERM GOAL #1   Title pt will reduce oswestry score by 15pts to improve function   Baseline 40%   Time 6   Period Weeks   Status On-going   PT LONG TERM GOAL #2   Title pt will sit with lumbar roll in place x 1 hour without increase in LBP/ leg pain   Time 6   Period Weeks   Status Achieved   PT LONG TERM GOAL #3   Title pt will  demonstrate proper lifting mechanics of 15lb item from floor to table x 5 with no inc. pain   Time 6   Period Weeks   Status On-going   PT LONG TERM GOAL #4   Title pt will be independent and compliant with HEP and independent aquatic exercise.    Time 6   Period Weeks   Status On-going   PT LONG TERM GOAL #5   Time 6               Plan - 09/25/15 2210    Clinical Impression Statement Pt demo'd improved gait mechanics to increase plantar fascia load. Pt recognized her heel striking pattern was about to correct with slight hip ER and alignment for equal weight bearing through transverse arch to minimize hip IR/foot pronation. Pt reported her knee pain returned up on exiting the pool. Pt was educated to elevate, ice knee and recommended  to get a knee sleeve until she is able to see her providers.    Rehab Potential Good   Clinical Impairments Affecting Rehab Potential Prognosis is limited by pt's poor compliance to CPAP use for OSA, alcohol and nicotine use, Hx of abdominal surgeries, and co-morbidities.    PT Frequency 2x / week   PT Duration 6 weeks   PT Treatment/Interventions Electrical Stimulation;Moist Heat;Stair training;Functional mobility training;Therapeutic activities;Therapeutic exercise;Balance training;Dry needling;Manual techniques;Neuromuscular re-education;Patient/family education;Energy conservation;Cryotherapy;Scar mobilization;Aquatic Therapy;Traction;Ultrasound;ADLs/Self Care Home Management   PT Next Visit Plan Continue strengthening and mobility   PT Home Exercise Plan Continue as prescribed   Consulted and Agree with Plan of Care Patient      Patient will benefit from skilled therapeutic intervention in order to improve the following deficits and impairments:  Abnormal gait, Cardiopulmonary status limiting activity, Decreased mobility, Decreased scar mobility, Hypomobility, Postural dysfunction, Improper body mechanics, Decreased activity tolerance, Obesity,  Other (comment), Pain, Decreased range of motion, Decreased strength, Increased fascial restricitons, Decreased knowledge of use of DME  Visit Diagnosis: Lumbago with sciatica, left side  Decreased mobility  Unspecified injury of muscle, fascia and tendon of abdomen, sequela  Lack of coordination     Problem List Patient Active Problem List   Diagnosis Date Noted  . Post-traumatic osteoarthritis of right knee 09/24/2015  . Obesity 08/06/2015  . Pain in  right knee 08/03/2015  . Postmenopausal 07/08/2015  . Preventative health care 07/08/2015  . Cervical cancer screening 07/08/2015  . Alcoholism in recovery ([place]) 04/13/2015  . Lymphocytosis 04/10/2015  . Impaired fasting glucose 04/10/2015  . Need for Streptococcus pneumoniae and influenza vaccination 04/09/2015  . Polyuria 04/09/2015  . Vitamin D deficiency 04/09/2015  . Vitamin B12 deficiency 04/09/2015  . Medication monitoring encounter 04/09/2015  . OSA on CPAP 04/09/2015  . Breast cancer screening 04/09/2015  . Subclinical hyperthyroidism 06/26/2014  . Toxic multinodular goiter 06/26/2014  . Hypertension 06/26/2014  . Hepatitis C 06/26/2014  . Tobacco dependence 06/26/2014  . Bipolar disorder ([place]) 06/26/2014  . Recurrent ventral hernia 11/08/2012    Jerl Mina ,PT, DPT, E-RYT  09/25/2015, 10:12 PM  [place] MAIN [facility] SERVICES [address] [place], [place], [address] Phone: [phone]   Fax:  [phone]  Name: JOVANNI MONTE MRN: [id number] Date of Birth: Oct 24, 1960

## 2015-09-28 ENCOUNTER — Ambulatory Visit: Payer: Commercial Managed Care - HMO

## 2015-09-28 DIAGNOSIS — M5442 Lumbago with sciatica, left side: Secondary | ICD-10-CM

## 2015-09-28 DIAGNOSIS — R279 Unspecified lack of coordination: Secondary | ICD-10-CM | POA: Diagnosis not present

## 2015-09-28 DIAGNOSIS — S39001S Unspecified injury of muscle, fascia and tendon of abdomen, sequela: Secondary | ICD-10-CM | POA: Diagnosis not present

## 2015-09-28 DIAGNOSIS — R2689 Other abnormalities of gait and mobility: Secondary | ICD-10-CM | POA: Diagnosis not present

## 2015-09-28 NOTE — Therapy (Signed)
[place] MAIN [facility] SERVICES [address] [place], [place], [address] Phone: [phone]   Fax:  [phone]  Physical Therapy Treatment  Patient Details  Name: Susan [name] MRN: [id number] Date of Birth: 1961/04/21 Referring Provider: Sharlet Salina  Encounter Date: 09/28/2015      PT End of Session - 09/28/15 0816    Visit Number 10   Number of Visits 13   Date for PT Re-Evaluation 09/30/15   Authorization Type 4/10   PT Start Time 0800   PT Stop Time 0845   PT Time Calculation (min) 45 min   Equipment Utilized During Treatment Other (comment)  Tball   Activity Tolerance Patient tolerated treatment well;No increased pain   Behavior During Therapy [facility] for tasks assessed/performed      Past Medical History  Diagnosis Date  . Thyroid disease     hyperthyroid  . 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 Hepatitis C   . Depression   . Anxiety   . Bipolar 1 disorder ([place])   . History of alcohol abuse   . History of hepatitis C   . Hypertension   . History of MRSA infection 2013  . History of diverticulitis 2013  . H/O suicide attempt     slit wrists  . Osteoporosis   . Arthritis     rheumatoid arthritis  . Sleep apnea     Pt discontinue use of CPAP last year, Dx 3 years ago.   . OSA (obstructive sleep apnea)     managed by Dr. Manuella Ghazi  . Hypothyroidism   . Multinodular thyroid   . Vitamin D deficiency disease   . Vitamin B12 deficiency   . Post-traumatic osteoarthritis of right knee 09/24/2015    Past Surgical History  Procedure Laterality Date  . Salpingoophorectomy Bilateral 11-13-2011  . Breast surgery Left 20 yrs ago  . Cesarean section    . Hernia repair  06/2011, July 2014    Ventral wall repair with Physiomesh  . Bilateral salpingoophorectomy  July 2013    due to abnormal mass    There were no vitals filed for this visit.      Subjective Assessment - 09/28/15 0815    Subjective pt reports her back pain has been under  control. she reports she is compliant to HEP   Pertinent History Hx of alcohol use but started to decrease beers from 6-8/day to 2-3/day and current smoker 1 pack a day.  Hx of 1 vaginal surgery (with tears/stitches), 1 C-section, umbilical hernia repair. Currently with diverticulitis and  monitoring diet. Unable to leave the house 1x/week due to frequent Type 6 (Bristol Stool Scale) bowel momvents    Limitations Sitting   How long can you sit comfortably? 30   How long can you walk comfortably? 3 blocks   Currently in Pain? Yes   Pain Score 1    Pain Location Back   Pain Orientation Lower   Pain Descriptors / Indicators Tightness       therex:    LTR with Tball x 3 min 5 s hold on each side Isometric ball crunch 5s 2x10 Isometric LTR 5s x 10 each side Pavlov press red band x 15 each side Low row red band 2x15 Bridge 2x10 Seated 08-08-22 2x10 Clamshell 2x10 Dead bug 2x10 Pt requires min verbal and tactile cues for proper exercise performance  Diaphragmatic breathing (initiating TA contraction) for all exercises  PT Long Term Goals - 09/28/15 0819    PT LONG TERM GOAL #1   Title pt will reduce oswestry score by 15pts to improve function   Baseline 40%   Time 6   Period Weeks   Status Achieved   PT LONG TERM GOAL #2   Title pt will sit with lumbar roll in place x 1 hour without increase in LBP/ leg pain   Time 6   Period Weeks   Status Achieved   PT LONG TERM GOAL #3   Title pt will demonstrate proper lifting mechanics of 15lb item from floor to table x 5 with no inc. pain   Time 6   Period Weeks   Status Achieved   PT LONG TERM GOAL #4   Title pt will be independent and compliant with HEP and independent aquatic exercise.    Time 6   Period Weeks   Status Partially Met   PT LONG TERM GOAL #5   Time 6               Plan - 09/28/15 0816    Clinical Impression Statement pt has achieved majority of PT goals at  this time. HEP was progressed today to incorporate exercises with the Tball. she would benefit from additional 1-2 visits with pelvic floor PT specialist to address abdominal scar tissue, and pt education for self massage. pt will likely DC after that.    Rehab Potential Good   Clinical Impairments Affecting Rehab Potential Prognosis is limited by pt's poor compliance to CPAP use for OSA, alcohol and nicotine use, Hx of abdominal surgeries, and co-morbidities.    PT Frequency 2x / week   PT Duration 6 weeks   PT Treatment/Interventions Electrical Stimulation;Moist Heat;Stair training;Functional mobility training;Therapeutic activities;Therapeutic exercise;Balance training;Dry needling;Manual techniques;Neuromuscular re-education;Patient/family education;Energy conservation;Cryotherapy;Scar mobilization;Aquatic Therapy;Traction;Ultrasound;ADLs/Self Care Home Management   PT Next Visit Plan Continue strengthening and mobility   PT Home Exercise Plan Continue as prescribed   Consulted and Agree with Plan of Care Patient      Patient will benefit from skilled therapeutic intervention in order to improve the following deficits and impairments:  Abnormal gait, Cardiopulmonary status limiting activity, Decreased mobility, Decreased scar mobility, Hypomobility, Postural dysfunction, Improper body mechanics, Decreased activity tolerance, Obesity, Other (comment), Pain, Decreased range of motion, Decreased strength, Increased fascial restricitons, Decreased knowledge of use of DME  Visit Diagnosis: Lumbago with sciatica, left side     Problem List Patient Active Problem List   Diagnosis Date Noted  . Post-traumatic osteoarthritis of right knee 09/24/2015  . Obesity 08/06/2015  . Pain in right knee 08/03/2015  . Postmenopausal 07/08/2015  . Preventative health care 07/08/2015  . Cervical cancer screening 07/08/2015  . Alcoholism in recovery ([place]) 04/13/2015  . Lymphocytosis 04/10/2015  . Impaired  fasting glucose 04/10/2015  . Need for Streptococcus pneumoniae and influenza vaccination 04/09/2015  . Polyuria 04/09/2015  . Vitamin D deficiency 04/09/2015  . Vitamin B12 deficiency 04/09/2015  . Medication monitoring encounter 04/09/2015  . OSA on CPAP 04/09/2015  . Breast cancer screening 04/09/2015  . Subclinical hyperthyroidism 06/26/2014  . Toxic multinodular goiter 06/26/2014  . Hypertension 06/26/2014  . Hepatitis C 06/26/2014  . Tobacco dependence 06/26/2014  . Bipolar disorder ([place]) 06/26/2014  . Recurrent ventral hernia 11/08/2012   Gorden Harms. Rayme Bui, PT, DPT [id number]  Tierria Watson 09/28/2015, 9:01 AM  [place] MAIN [facility] SERVICES [address] [place], [place], [address] Phone: [phone]   Fax:  [phone]  Name: Susan [name] MRN: [id number] Date of Birth: [birth year]

## 2015-09-28 NOTE — Patient Instructions (Addendum)
[email or web address] LTR with Tball x 3 min 5 s hold on each side Isometric ball crunch 5s 2x10 Isometric LTR 5s x 10 each side Pavlov press red band x 15 each side Low row red band 2x15 Bridge 2x10 Seated 07/22/2022 2x10 Dead bug 2x10 Pt requires min verbal and tactile cues for proper exercise performance  Diaphragmatic breathing (initiating TA contraction) for all exercises

## 2015-09-30 ENCOUNTER — Other Ambulatory Visit: Payer: Self-pay | Admitting: Family Medicine

## 2015-09-30 ENCOUNTER — Ambulatory Visit: Payer: Commercial Managed Care - HMO | Admitting: Physical Therapy

## 2015-09-30 ENCOUNTER — Telehealth: Payer: Self-pay | Admitting: Family Medicine

## 2015-09-30 DIAGNOSIS — M5442 Lumbago with sciatica, left side: Secondary | ICD-10-CM | POA: Diagnosis not present

## 2015-09-30 DIAGNOSIS — S39001S Unspecified injury of muscle, fascia and tendon of abdomen, sequela: Secondary | ICD-10-CM

## 2015-09-30 DIAGNOSIS — R2689 Other abnormalities of gait and mobility: Secondary | ICD-10-CM

## 2015-09-30 DIAGNOSIS — R279 Unspecified lack of coordination: Secondary | ICD-10-CM | POA: Diagnosis not present

## 2015-09-30 NOTE — Patient Instructions (Signed)
Handout on pre massage ROM routine for improved circulation  Handout on abdominal massage to decrease scar adhesions  Logg rolling out of bed to decrease strain on abdomen

## 2015-09-30 NOTE — Therapy (Signed)
[place] MAIN [facility] SERVICES [address] [place], [place], [address] Phone: [phone]   Fax:  [phone]  Physical Therapy Treatment  Patient Details  Name: Susan [name] MRN: [id number] Date of Birth: 1961/03/09 Referring Provider: Sharlet Salina  Encounter Date: 09/30/2015      PT End of Session - 09/30/15 1642    Visit Number 11   Number of Visits 13   Date for PT Re-Evaluation 09/30/15   Authorization Type 5/10   PT Start Time 1600   PT Stop Time 1635   PT Time Calculation (min) 35 min   Equipment Utilized During Treatment Other (comment)  Tball   Activity Tolerance Patient tolerated treatment well;No increased pain   Behavior During Therapy [facility] for tasks assessed/performed      Past Medical History  Diagnosis Date  . Thyroid disease     hyperthyroid  . 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 Hepatitis C   . Depression   . Anxiety   . Bipolar 1 disorder ([place])   . History of alcohol abuse   . History of hepatitis C   . Hypertension   . History of MRSA infection 2013  . History of diverticulitis 2013  . H/O suicide attempt     slit wrists  . Osteoporosis   . Arthritis     rheumatoid arthritis  . Sleep apnea     Pt discontinue use of CPAP last year, Dx 3 years ago.   . OSA (obstructive sleep apnea)     managed by Dr. Manuella Ghazi  . Hypothyroidism   . Multinodular thyroid   . Vitamin D deficiency disease   . Vitamin B12 deficiency   . Post-traumatic osteoarthritis of right knee 09/24/2015    Past Surgical History  Procedure Laterality Date  . Salpingoophorectomy Bilateral 11-13-2011  . Breast surgery Left 20 yrs ago  . Cesarean section    . Hernia repair  06/2011, July 2014    Ventral wall repair with Physiomesh  . Bilateral salpingoophorectomy  July 2013    due to abnormal mass    There were no vitals filed for this visit.      Subjective Assessment - 09/30/15 1603    Subjective Pt reports she has been working on her walking  technique with more weightbearing on the balls of her foot instead of the heels. Pt has less knee pain as pt reported she was provided cane and knee brace at her PCP visit. Pt states she occasionally feels an aggravating pain in L > R groin near her abdominal scar.    Pertinent History Hx of alcohol use but started to decrease beers from 6-8/day to 2-3/day and current smoker 1 pack a day.  Hx of 1 vaginal surgery (with tears/stitches), 1 C-section, umbilical hernia repair. Currently with diverticulitis and  monitoring diet. Unable to leave the house 1x/week due to frequent Type 6 (Bristol Stool Scale) bowel momvents    Limitations Sitting   How long can you sit comfortably? 30   How long can you walk comfortably? 3 blocks            [facility] PT Assessment - 09/30/15 1638    Palpation   Palpation comment increased scar adhesions along suprapubic scar, decreased post Tx                     [facility] Adult PT Treatment/Exercise - 09/30/15 1638    Therapeutic Activites    Therapeutic Activities --  guided pt on abdominal massage    Manual Therapy   Manual therapy comments abdominal massage, scar releases                 PT Education - 09/30/15 1641    Education provided Yes   Education Details HEP   Person(s) Educated Patient   Methods Explanation;Demonstration;Tactile cues;Verbal cues;Handout   Comprehension Returned demonstration;Verbalized understanding             PT Long Term Goals - 09/28/15 0819    PT LONG TERM GOAL #1   Title pt will reduce oswestry score by 15pts to improve function   Baseline 40%   Time 6   Period Weeks   Status Achieved   PT LONG TERM GOAL #2   Title pt will sit with lumbar roll in place x 1 hour without increase in LBP/ leg pain   Time 6   Period Weeks   Status Achieved   PT LONG TERM GOAL #3   Title pt will demonstrate proper lifting mechanics of 15lb item from floor to table x 5 with no inc. pain   Time 6   Period Weeks    Status Achieved   PT LONG TERM GOAL #4   Title pt will be independent and compliant with HEP and independent aquatic exercise.    Time 6   Period Weeks   Status Partially Met   PT LONG TERM GOAL #5   Time 6               Plan - 09/30/15 1642    Clinical Impression Statement Pt demo;d decrease abdominal scar restrictions post Tx and pt reported feeling "lighter" in her legs and less "clogged" in her lower abdominal area. Pt was educated on log rolilng technique to minimize abdominal straining and worsening of hernia. Pt will come for one more visit before d/c to monitor whether of not the scar massage has helped with her c/o aggravating pain in her groin and to progress with deep core strengthening exercise. Suspect these Tx will continue to help pt with her postural stability and minimize relapse of Sx.  Pt is progressing well towards her goals.   Rehab Potential Good   Clinical Impairments Affecting Rehab Potential Prognosis is limited by pt's poor compliance to CPAP use for OSA, alcohol and nicotine use, Hx of abdominal surgeries, and co-morbidities.    PT Frequency 2x / week   PT Duration 6 weeks   PT Treatment/Interventions Electrical Stimulation;Moist Heat;Stair training;Functional mobility training;Therapeutic activities;Therapeutic exercise;Balance training;Dry needling;Manual techniques;Neuromuscular re-education;Patient/family education;Energy conservation;Cryotherapy;Scar mobilization;Aquatic Therapy;Traction;Ultrasound;ADLs/Self Care Home Management   PT Next Visit Plan Continue strengthening and mobility   PT Home Exercise Plan Continue as prescribed   Consulted and Agree with Plan of Care Patient      Patient will benefit from skilled therapeutic intervention in order to improve the following deficits and impairments:  Abnormal gait, Cardiopulmonary status limiting activity, Decreased mobility, Decreased scar mobility, Hypomobility, Postural dysfunction, Improper body  mechanics, Decreased activity tolerance, Obesity, Other (comment), Pain, Decreased range of motion, Decreased strength, Increased fascial restricitons, Decreased knowledge of use of DME  Visit Diagnosis: Lumbago with sciatica, left side  Decreased mobility  Unspecified injury of muscle, fascia and tendon of abdomen, sequela  Lack of coordination     Problem List Patient Active Problem List   Diagnosis Date Noted  . Post-traumatic osteoarthritis of right knee 09/24/2015  . Obesity 08/06/2015  . Pain in right knee 08/03/2015  . Postmenopausal  07/08/2015  . Preventative health care 07/08/2015  . Cervical cancer screening 07/08/2015  . Alcoholism in recovery ([place]) 04/13/2015  . Lymphocytosis 04/10/2015  . Impaired fasting glucose 04/10/2015  . Need for Streptococcus pneumoniae and influenza vaccination 04/09/2015  . Polyuria 04/09/2015  . Vitamin D deficiency 04/09/2015  . Vitamin B12 deficiency 04/09/2015  . Medication monitoring encounter 04/09/2015  . OSA on CPAP 04/09/2015  . Breast cancer screening 04/09/2015  . Subclinical hyperthyroidism 06/26/2014  . Toxic multinodular goiter 06/26/2014  . Hypertension 06/26/2014  . Hepatitis C 06/26/2014  . Tobacco dependence 06/26/2014  . Bipolar disorder ([place]) 06/26/2014  . Recurrent ventral hernia 11/08/2012    Jerl Mina ,PT, DPT, E-RYT  09/30/2015, 4:47 PM  [place] MAIN [facility] SERVICES [address] [place], [place], [address] Phone: [phone]   Fax:  [phone]  Name: Susan [name] MRN: [id number] Date of Birth: 1961-04-30

## 2015-09-30 NOTE — Telephone Encounter (Signed)
Check referral

## 2015-10-02 DIAGNOSIS — E0520 Thyrotoxicosis with toxic multinodular goiter without thyrotoxic crisis or storm: Secondary | ICD-10-CM | POA: Diagnosis not present

## 2015-10-06 ENCOUNTER — Encounter: Payer: Self-pay | Admitting: Emergency Medicine

## 2015-10-06 ENCOUNTER — Telehealth: Payer: Self-pay | Admitting: Family Medicine

## 2015-10-06 DIAGNOSIS — K5792 Diverticulitis of intestine, part unspecified, without perforation or abscess without bleeding: Secondary | ICD-10-CM | POA: Insufficient documentation

## 2015-10-06 DIAGNOSIS — Z5321 Procedure and treatment not carried out due to patient leaving prior to being seen by health care provider: Secondary | ICD-10-CM | POA: Diagnosis not present

## 2015-10-06 DIAGNOSIS — G4733 Obstructive sleep apnea (adult) (pediatric): Secondary | ICD-10-CM | POA: Diagnosis not present

## 2015-10-06 DIAGNOSIS — R109 Unspecified abdominal pain: Secondary | ICD-10-CM | POA: Diagnosis present

## 2015-10-06 LAB — URINALYSIS COMPLETE WITH MICROSCOPIC (ARMC ONLY)
Bacteria, UA: NONE SEEN
Bilirubin Urine: NEGATIVE
Glucose, UA: NEGATIVE mg/dL
Hgb urine dipstick: NEGATIVE
Ketones, ur: NEGATIVE mg/dL
Leukocytes, UA: NEGATIVE
Nitrite: NEGATIVE
Protein, ur: NEGATIVE mg/dL
Specific Gravity, Urine: 1.002 — ABNORMAL LOW (ref 1.005–1.030)
pH: 6 (ref 5.0–8.0)

## 2015-10-06 LAB — COMPREHENSIVE METABOLIC PANEL
ALT: 27 U/L (ref 14–54)
AST: 22 U/L (ref 15–41)
Albumin: 4.7 g/dL (ref 3.5–5.0)
Alkaline Phosphatase: 87 U/L (ref 38–126)
Anion gap: 9 (ref 5–15)
BUN: 8 mg/dL (ref 6–20)
CO2: 24 mmol/L (ref 22–32)
Calcium: 9.3 mg/dL (ref 8.9–10.3)
Chloride: 105 mmol/L (ref 101–111)
Creatinine, Ser: 0.8 mg/dL (ref 0.44–1.00)
GFR calc Af Amer: >60 mL/min (ref >60)
GFR calc non Af Amer: >60 mL/min (ref >60)
Glucose, Bld: 97 mg/dL (ref 65–99)
Potassium: 3.6 mmol/L (ref 3.5–5.1)
Sodium: 138 mmol/L (ref 135–145)
Total Bilirubin: 0.2 mg/dL — ABNORMAL LOW (ref 0.3–1.2)
Total Protein: 7.9 g/dL (ref 6.5–8.1)

## 2015-10-06 LAB — CBC
HCT: 43.8 % (ref 35.0–47.0)
Hemoglobin: 14.9 g/dL (ref 12.0–16.0)
MCH: 30.4 pg (ref 26.0–34.0)
MCHC: 34 g/dL (ref 32.0–36.0)
MCV: 89.4 fL (ref 80.0–100.0)
Platelets: 235 10*3/uL (ref 150–440)
RBC: 4.9 MIL/uL (ref 3.80–5.20)
RDW: 13.3 % (ref 11.5–14.5)
WBC: 14.9 10*3/uL — ABNORMAL HIGH (ref 3.6–11.0)

## 2015-10-06 LAB — LIPASE, BLOOD: Lipase: 17 U/L (ref 11–51)

## 2015-10-06 NOTE — ED Notes (Signed)
Pt arrived to the ED for complaints of abdominal pain. Pt states that she suffers of diverticulitis and "this feels just like last time." Pt is AOx4 in moderate pain distress.

## 2015-10-06 NOTE — Telephone Encounter (Signed)
PT IS NEEDING A REFERRAL FOR HER ENDO WITH DR SOLUM. PT HAS APPT ON June 2 @ 9:45.

## 2015-10-07 ENCOUNTER — Emergency Department
Admission: EM | Admit: 2015-10-07 | Discharge: 2015-10-07 | Disposition: A | Discharge status: Home / Self Care | Payer: Commercial Managed Care - HMO | Attending: Emergency Medicine | Admitting: Emergency Medicine

## 2015-10-08 ENCOUNTER — Telehealth: Payer: Self-pay | Admitting: Emergency Medicine

## 2015-10-08 NOTE — ED Notes (Signed)
Called patient due to lwot to inquire about condition and follow up plans.  She has appt tomorrow with pcp.  Says she is actually feeling better than she did.

## 2015-10-09 ENCOUNTER — Encounter: Payer: Self-pay | Admitting: Family Medicine

## 2015-10-09 ENCOUNTER — Ambulatory Visit (INDEPENDENT_AMBULATORY_CARE_PROVIDER_SITE_OTHER): Payer: Commercial Managed Care - HMO | Admitting: Family Medicine

## 2015-10-09 ENCOUNTER — Ambulatory Visit
Admission: RE | Admit: 2015-10-09 | Discharge: 2015-10-09 | Disposition: A | Discharge status: Home / Self Care | Payer: Commercial Managed Care - HMO | Source: Ambulatory Visit | Attending: Family Medicine | Admitting: Family Medicine

## 2015-10-09 VITALS — BP 130/72 | HR 85 | Temp 97.8°F | Resp 16 | Wt 215.0 lb

## 2015-10-09 DIAGNOSIS — R1084 Generalized abdominal pain: Secondary | ICD-10-CM

## 2015-10-09 DIAGNOSIS — R7301 Impaired fasting glucose: Secondary | ICD-10-CM

## 2015-10-09 DIAGNOSIS — E0520 Thyrotoxicosis with toxic multinodular goiter without thyrotoxic crisis or storm: Secondary | ICD-10-CM

## 2015-10-09 DIAGNOSIS — G8929 Other chronic pain: Secondary | ICD-10-CM | POA: Diagnosis not present

## 2015-10-09 DIAGNOSIS — I10 Essential (primary) hypertension: Secondary | ICD-10-CM

## 2015-10-09 DIAGNOSIS — M549 Dorsalgia, unspecified: Secondary | ICD-10-CM

## 2015-10-09 DIAGNOSIS — K5730 Diverticulosis of large intestine without perforation or abscess without bleeding: Secondary | ICD-10-CM | POA: Diagnosis not present

## 2015-10-09 DIAGNOSIS — K439 Ventral hernia without obstruction or gangrene: Secondary | ICD-10-CM | POA: Insufficient documentation

## 2015-10-09 DIAGNOSIS — E042 Nontoxic multinodular goiter: Secondary | ICD-10-CM | POA: Diagnosis not present

## 2015-10-09 DIAGNOSIS — R109 Unspecified abdominal pain: Secondary | ICD-10-CM | POA: Insufficient documentation

## 2015-10-09 HISTORY — DX: Unspecified asthma, uncomplicated: J45.909

## 2015-10-09 MED ORDER — METRONIDAZOLE 500 MG PO TABS: 500.0000 mg | ORAL_TABLET | Freq: Three times a day (TID) | ORAL | Status: AC

## 2015-10-09 MED ORDER — CIPROFLOXACIN HCL 500 MG PO TABS: 500.0000 mg | ORAL_TABLET | Freq: Two times a day (BID) | ORAL | Status: AC

## 2015-10-09 MED ORDER — IOPAMIDOL (ISOVUE-300) INJECTION 61%
100.0000 mL | Freq: Once | INTRAVENOUS | Status: AC | PRN
  Administered 2015-10-09: 100 mL via INTRAVENOUS

## 2015-10-09 NOTE — Patient Instructions (Addendum)
Let's start cipro and flagyl Let's get labs and a CT scan today If you feel worse before we get these done, just go right to the ER Bland food and hydration Please do eat yogurt daily or take a probiotic daily for the next month or two We want to replace the healthy germs in the gut If you notice foul, watery diarrhea in the next two months, schedule an appointment RIGHT AWAY Return in 2 weeks

## 2015-10-09 NOTE — Assessment & Plan Note (Signed)
Followed by Dr. Solum. 

## 2015-10-09 NOTE — Progress Notes (Signed)
BP 130/72 mmHg  Pulse 85  Temp(Src) 97.8 F (36.6 C) (Oral)  Resp 16  Wt 215 lb (97.523 kg)  SpO2 95%   Subjective:    Patient ID: Susan [name], female    DOB: Oct 31, 1960, 56 y.o.   MRN: [id number]  HPI: Susan [name] is a 55 y.o. female  Chief Complaint  Patient presents with  . Follow-up    5 weeks  . Diverticulitis   She is having a major flare up of diverticulitis Went to the ER on Wednesday; going on since Sunday Everything she eats goes right through her; poop is narrow; so painful to have a bowel movement, cramping; it's awful She had labs done and she didn't stay to see the doctor Abdomen hurts down low and up high it feels hard; cramps in stomach and back like labor No fever usually, feels a little feverish and has had chills; does not feel herself Pain level is 6 out of 10 Lipase was normal at 17 White count was high at 14.9k CMP was normal Urine was unremarkable Previous episodes of diverticulitis, has bouts from time to time, says she gets this twice a month One doctor told her to eat a lot of fiber, but "ewww", it sends her running Yesterday, all she had was a sausage biscuit, canteloupe and plain pancakes Last colonoscopy was a year ago, Dr. Candace Cruise at [facility] Thursday before, therapist was doing a lot of massage and releasing toxins in the lower abdomen and stomach Sx started Sunday  Prediabetes; here for labs; trying to keep sweets out of the house; no sugary drinks  Thyroid is managed by Dr. Gabriel Carina  Needs to see Dr. Sharlet Salina, chronic back pain  Depression screen [facility] 2/9 10/09/2015 09/24/2015 09/01/2015 07/08/2015  Decreased Interest 0 3 0 0  Down, Depressed, Hopeless 1 1 1 3   PHQ - 2 Score 1 4 1 3   Altered sleeping - 3 - 3  Tired, decreased energy - 3 - 0  Change in appetite - 3 - 3  Feeling bad or failure about yourself  - 1 - 0  Trouble concentrating - 3 - 0  Moving slowly or fidgety/restless - 3 - 0  Suicidal thoughts - 0 - 1  PHQ-9 Score - 20  - 10  Difficult doing work/chores - Somewhat difficult - Somewhat difficult   Relevant past medical, surgical, family and social history reviewed Past Medical History  Diagnosis Date  . Thyroid disease     hyperthyroid  . 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 Hepatitis C   . Depression   . Anxiety   . Bipolar 1 disorder ([place])   . History of alcohol abuse   . History of hepatitis C   . Hypertension   . History of MRSA infection 2013  . History of diverticulitis 2013  . H/O suicide attempt     slit wrists  . Osteoporosis   . Arthritis     rheumatoid arthritis  . Sleep apnea     Pt discontinue use of CPAP last year, Dx 3 years ago.   . OSA (obstructive sleep apnea)     managed by Dr. Manuella Ghazi  . Hypothyroidism   . Multinodular thyroid   . Vitamin D deficiency disease   . Vitamin B12 deficiency   . Post-traumatic osteoarthritis of right knee 09/24/2015  . Diverticulitis    Past Surgical History  Procedure Laterality Date  . Salpingoophorectomy Bilateral 11-13-2011  . Breast surgery Left 20 yrs ago  .  Cesarean section    . Hernia repair  06/2011, July 2014    Ventral wall repair with Physiomesh  . Bilateral salpingoophorectomy  July 2013    due to abnormal mass   Family History  Problem Relation Age of Onset  . Alcohol abuse Father   . Arthritis Mother   . Asthma Mother   . Mental illness Mother   . Thyroid disease Mother   . COPD Mother   . Heart disease Mother   . Congestive Heart Failure Mother   . Arthritis Brother   . Mental illness Brother   . Cancer Brother     non-hodkins lymphoma  . Alcohol abuse Sister   . Drug abuse Sister   . Mental illness Sister   . Diabetes Neg Hx   . Stroke Neg Hx    Social History  Substance Use Topics  . Smoking status: Current Every Day Smoker -- 0.50 packs/day for 30 years    Types: Cigarettes  . Smokeless tobacco: Never Used  . Alcohol Use: No   Interim medical history since last visit reviewed. Allergies and medications  reviewed  Review of Systems Per HPI unless specifically indicated above     Objective:    BP 130/72 mmHg  Pulse 85  Temp(Src) 97.8 F (36.6 C) (Oral)  Resp 16  Wt 215 lb (97.523 kg)  SpO2 95%  Wt Readings from Last 3 Encounters:  10/09/15 215 lb (97.523 kg)  10/06/15 216 lb (97.977 kg)  09/24/15 216 lb (97.977 kg)    Physical Exam  Constitutional: She appears well-developed and well-nourished. No distress.  HENT:  Head: Normocephalic and atraumatic.  Eyes: EOM are normal. No scleral icterus.  Neck: No thyromegaly present.  Cardiovascular: Normal rate, regular rhythm and normal heart sounds.   No murmur heard. Pulmonary/Chest: Effort normal and breath sounds normal. No respiratory distress. She has no wheezes.  Abdominal: Soft. Bowel sounds are normal. She exhibits no distension. There is tenderness (epigastrium, diffuse lower abdomen suprapubic and LLQ). There is no guarding.  Musculoskeletal: Normal range of motion. She exhibits no edema.  Neurological: She is alert.  Skin: Skin is warm and dry. She is not diaphoretic. No pallor.  Psychiatric: She has a normal mood and affect. Her behavior is normal. Judgment and thought content normal.    Results for orders placed or performed during the hospital encounter of 10/07/15  Lipase, blood  Result Value Ref Range   Lipase 17 11 - 51 U/L  Comprehensive metabolic panel  Result Value Ref Range   Sodium 138 135 - 145 mmol/L   Potassium 3.6 3.5 - 5.1 mmol/L   Chloride 105 101 - 111 mmol/L   CO2 24 22 - 32 mmol/L   Glucose, Bld 97 65 - 99 mg/dL   BUN 8 6 - 20 mg/dL   Creatinine, Ser 0.80 0.44 - 1.00 mg/dL   Calcium 9.3 8.9 - 10.3 mg/dL   Total Protein 7.9 6.5 - 8.1 g/dL   Albumin 4.7 3.5 - 5.0 g/dL   AST 22 15 - 41 U/L   ALT 27 14 - 54 U/L   Alkaline Phosphatase 87 38 - 126 U/L   Total Bilirubin 0.2 (L) 0.3 - 1.2 mg/dL   GFR calc non Af Amer >60 >60 mL/min   GFR calc Af Amer >60 >60 mL/min   Anion gap 9 5 - 15  CBC   Result Value Ref Range   WBC 14.9 (H) 3.6 - 11.0 K/uL   RBC  4.90 3.80 - 5.20 MIL/uL   Hemoglobin 14.9 12.0 - 16.0 g/dL   HCT 43.8 35.0 - 47.0 %   MCV 89.4 80.0 - 100.0 fL   MCH 30.4 26.0 - 34.0 pg   MCHC 34.0 32.0 - 36.0 g/dL   RDW 13.3 11.5 - 14.5 %   Platelets 235 150 - 440 K/uL  Urinalysis complete, with microscopic  Result Value Ref Range   Color, Urine STRAW (A) YELLOW   APPearance CLEAR (A) CLEAR   Glucose, UA NEGATIVE NEGATIVE mg/dL   Bilirubin Urine NEGATIVE NEGATIVE   Ketones, ur NEGATIVE NEGATIVE mg/dL   Specific Gravity, Urine 1.002 (L) 1.005 - 1.030   Hgb urine dipstick NEGATIVE NEGATIVE   pH 6.0 5.0 - 8.0   Protein, ur NEGATIVE NEGATIVE mg/dL   Nitrite NEGATIVE NEGATIVE   Leukocytes, UA NEGATIVE NEGATIVE   RBC / HPF 0-5 0 - 5 RBC/hpf   WBC, UA 0-5 0 - 5 WBC/hpf   Bacteria, UA NONE SEEN NONE SEEN   Squamous Epithelial / LPF 0-5 (A) NONE SEEN      Assessment & Plan:   Problem List Items Addressed This Visit      Cardiovascular and Mediastinum   Hypertension    Controlled, continue med        Endocrine   Impaired fasting glucose    Check A1c      Relevant Orders   Hemoglobin A1c   Lipid Panel w/o Chol/HDL Ratio   Toxic multinodular goiter    Followed by Dr. Gabriel Carina        Other   Abdominal pain - Primary    Will get CT scan this afternoon after endo appt, labs as well; start antibiotics presuming diverticulitis      Relevant Orders   CT Abdomen Pelvis W Contrast   CBC with Differential/Platelet   Comprehensive metabolic panel   Lipase   Chronic back pain   Relevant Orders   Ambulatory referral to Physical Medicine Rehab      Follow up plan: Return in about 2 weeks (around 10/23/2015) for follow-up.  An after-visit summary was printed and given to the patient at [facility].  Please see the patient instructions which may contain other information and recommendations beyond what is mentioned above in the assessment and plan.  Meds ordered  this encounter  Medications  . ciprofloxacin (CIPRO) 500 MG tablet    Sig: Take 1 tablet (500 mg total) by mouth 2 (two) times daily.    Dispense:  20 tablet    Refill:  0  . metroNIDAZOLE (FLAGYL) 500 MG tablet    Sig: Take 1 tablet (500 mg total) by mouth 3 (three) times daily.    Dispense:  30 tablet    Refill:  0    Orders Placed This Encounter  Procedures  . CT Abdomen Pelvis W Contrast  . CBC with Differential/Platelet  . Hemoglobin A1c  . Lipid Panel w/o Chol/HDL Ratio  . Comprehensive metabolic panel  . Lipase  . Ambulatory referral to Physical Medicine Rehab

## 2015-10-09 NOTE — Assessment & Plan Note (Signed)
Check A1c. 

## 2015-10-09 NOTE — Assessment & Plan Note (Signed)
Will get CT scan this afternoon after endo appt, labs as well; start antibiotics presuming diverticulitis

## 2015-10-09 NOTE — Assessment & Plan Note (Signed)
Controlled, continue med

## 2015-10-10 LAB — HEMOGLOBIN A1C
Est. average glucose Bld gHb Est-mCnc: 128 mg/dL
Hgb A1c MFr Bld: 6.1 % — ABNORMAL HIGH (ref 4.8–5.6)

## 2015-10-10 LAB — COMPREHENSIVE METABOLIC PANEL
ALT: 18 IU/L (ref 0–32)
AST: 19 IU/L (ref 0–40)
Albumin/Globulin Ratio: 1.6 (ref 1.2–2.2)
Albumin: 4.2 g/dL (ref 3.5–5.5)
Alkaline Phosphatase: 88 IU/L (ref 39–117)
BUN/Creatinine Ratio: 9 (ref 9–23)
BUN: 8 mg/dL (ref 6–24)
Bilirubin Total: 0.4 mg/dL (ref 0.0–1.2)
CO2: 24 mmol/L (ref 18–29)
Calcium: 9.3 mg/dL (ref 8.7–10.2)
Chloride: 99 mmol/L (ref 96–106)
Creatinine, Ser: 0.9 mg/dL (ref 0.57–1.00)
GFR calc Af Amer: 84 mL/min/{1.73_m2} (ref >59)
GFR calc non Af Amer: 73 mL/min/{1.73_m2} (ref >59)
Globulin, Total: 2.6 g/dL (ref 1.5–4.5)
Glucose: 92 mg/dL (ref 65–99)
Potassium: 4.4 mmol/L (ref 3.5–5.2)
Sodium: 139 mmol/L (ref 134–144)
Total Protein: 6.8 g/dL (ref 6.0–8.5)

## 2015-10-10 LAB — CBC WITH DIFFERENTIAL/PLATELET
Basophils Absolute: 0.1 10*3/uL (ref 0.0–0.2)
Basos: 1 %
EOS (ABSOLUTE): 0.2 10*3/uL (ref 0.0–0.4)
Eos: 2 %
Hematocrit: 42.5 % (ref 34.0–46.6)
Hemoglobin: 14.7 g/dL (ref 11.1–15.9)
Immature Grans (Abs): 0 10*3/uL (ref 0.0–0.1)
Immature Granulocytes: 0 %
Lymphocytes Absolute: 3.6 10*3/uL — ABNORMAL HIGH (ref 0.7–3.1)
Lymphs: 35 %
MCH: 31.2 pg (ref 26.6–33.0)
MCHC: 34.6 g/dL (ref 31.5–35.7)
MCV: 90 fL (ref 79–97)
Monocytes Absolute: 0.5 10*3/uL (ref 0.1–0.9)
Monocytes: 5 %
Neutrophils Absolute: 6.1 10*3/uL (ref 1.4–7.0)
Neutrophils: 57 %
Platelets: 241 10*3/uL (ref 150–379)
RBC: 4.71 x10E6/uL (ref 3.77–5.28)
RDW: 13.3 % (ref 12.3–15.4)
WBC: 10.5 10*3/uL (ref 3.4–10.8)

## 2015-10-10 LAB — LIPID PANEL W/O CHOL/HDL RATIO
Cholesterol, Total: 182 mg/dL (ref 100–199)
HDL: 42 mg/dL (ref >39)
LDL Calculated: 119 mg/dL — ABNORMAL HIGH (ref 0–99)
Triglycerides: 106 mg/dL (ref 0–149)
VLDL Cholesterol Cal: 21 mg/dL (ref 5–40)

## 2015-10-10 LAB — LIPASE: Lipase: 24 U/L (ref 0–59)

## 2015-10-14 DIAGNOSIS — F4310 Post-traumatic stress disorder, unspecified: Secondary | ICD-10-CM | POA: Diagnosis not present

## 2015-10-14 DIAGNOSIS — M1711 Unilateral primary osteoarthritis, right knee: Secondary | ICD-10-CM | POA: Diagnosis not present

## 2015-10-20 ENCOUNTER — Ambulatory Visit: Payer: Commercial Managed Care - HMO | Admitting: Physical Therapy

## 2015-10-23 ENCOUNTER — Ambulatory Visit (INDEPENDENT_AMBULATORY_CARE_PROVIDER_SITE_OTHER): Payer: Commercial Managed Care - HMO | Admitting: Family Medicine

## 2015-10-23 ENCOUNTER — Encounter: Payer: Self-pay | Admitting: Family Medicine

## 2015-10-23 VITALS — BP 122/84 | HR 87 | Temp 97.7°F | Resp 16 | Wt 218.0 lb

## 2015-10-23 DIAGNOSIS — E669 Obesity, unspecified: Secondary | ICD-10-CM | POA: Diagnosis not present

## 2015-10-23 DIAGNOSIS — R7301 Impaired fasting glucose: Secondary | ICD-10-CM

## 2015-10-23 DIAGNOSIS — F17200 Nicotine dependence, unspecified, uncomplicated: Secondary | ICD-10-CM

## 2015-10-23 DIAGNOSIS — E0590 Thyrotoxicosis, unspecified without thyrotoxic crisis or storm: Secondary | ICD-10-CM | POA: Diagnosis not present

## 2015-10-23 DIAGNOSIS — F1020 Alcohol dependence, uncomplicated: Secondary | ICD-10-CM | POA: Diagnosis not present

## 2015-10-23 DIAGNOSIS — R1084 Generalized abdominal pain: Secondary | ICD-10-CM

## 2015-10-23 DIAGNOSIS — D72820 Lymphocytosis (symptomatic): Secondary | ICD-10-CM | POA: Diagnosis not present

## 2015-10-23 DIAGNOSIS — F1021 Alcohol dependence, in remission: Secondary | ICD-10-CM

## 2015-10-23 NOTE — Assessment & Plan Note (Addendum)
Still having abdominal pain; recent CT scan; refer to Dr. Jamal Collin; possible adhesions, so we'll start with surgeon; consider GI next; continue fiber and hydration; having good BMs so won't add anything for bowel regulation; no blood in stool; go to ER if needed

## 2015-10-23 NOTE — Patient Instructions (Addendum)
Try using myfitnesspal or some other calorie counter Try giving up all products that come from a cow or a pig Let's have you meet with a nutritionist and a surgeon Check out the information at familydoctor.org entitled "Nutrition for Weight Loss: What You Need to Know about Fad Diets" Try to lose between 1-2 pounds per week by taking in fewer calories and burning off more calories You can succeed by limiting portions, limiting foods dense in calories and fat, becoming more active, and drinking 8 glasses of water a day (64 ounces) Don't skip meals, especially breakfast, as skipping meals may alter your metabolism Do not use over-the-counter weight loss pills or gimmicks that claim rapid weight loss A healthy BMI (or body mass index) is between 18.5 and 24.9 You can calculate your ideal BMI at the [place] website ClubMonetize.fr

## 2015-10-23 NOTE — Progress Notes (Signed)
BP 122/84 mmHg  Pulse 87  Temp(Src) 97.7 F (36.5 C) (Oral)  Resp 16  Wt 218 lb (98.884 kg)  SpO2 97%   Subjective:    Patient ID: Susan [name], female    DOB: [birth year], 55 y.o.   MRN: [id number]  HPI: Susan [name] is a 55 y.o. female  Chief Complaint  Patient presents with  . Follow-up    2 weeks   She is here about her bloodwork and her stomach Her stomach gets so hard, "you could bounce a quarter off of it"; leans against the counter washing dishes and the belly button hurts where the hernia is She had to double over from abdominal pain this morning Just weird she says; going on for a while; getting progressively worse No relation to food Having good bowel movements; eating shredded wheat, canteloupes and watermelon, good fruits; not losing weight when she quit drinking; alreay mopped 15x13' room, mowing yards, staying active; not losing weight Sees Dr. Gabriel Carina for her thyroid; she wants to try synthroid to see if that helps; our last TSH was 0.030 in December 2016, other labs in [facility] She says there is some syndrome in women that has to do with how your pancreas regulates sugar She has gained 3 pounds in 2 weeks; drinks tea with equal in it; does not drink enough water Not eating out, cooks fresh veggies; having ham and veggies for Sunday dinner; does not eat that much; "I have snacked a little"; not touching ice cream at the house She has met with a nutritionist She has lumps from previous scar tissue she says; hernia repair Prediabetes; reviewed last A1c Lab Results  Component Value Date   HGBA1C 6.1* 10/09/2015   Her lymphs came down Lab Results  Component Value Date   WBC 10.5 10/09/2015   HGB 14.9 10/06/2015   HCT 42.5 10/09/2015   MCV 90 10/09/2015   PLT 241 10/09/2015  Lymphocyte count dropped from 5.2k to 3.6k  LDL okay at 119; goal still less than 130 (will drop to less than 100 if she develops frank diabetes, I explained)  Lipase  normal at 24 on June 2nd  Depression screen [facility] 2/9 10/09/2015 09/24/2015 09/01/2015 07/08/2015  Decreased Interest 0 3 0 0  Down, Depressed, Hopeless _0 PHQ - 2 Score _1 Altered sleeping - 3 - 3  Tired, decreased energy - 3 - 0  Change in appetite - 3 - 3  Feeling bad or failure about yourself  - 1 - 0  Trouble concentrating - 3 - 0  Moving slowly or fidgety/restless - 3 - 0  Suicidal thoughts - 0 - 1  PHQ-9 Score - 20 - 10  Difficult doing work/chores - Somewhat difficult - Somewhat difficult   Relevant past medical, surgical, family and social history reviewed Past Medical History  Diagnosis Date  . Thyroid disease     hyperthyroid  . 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 Hepatitis C   . Depression   . Anxiety   . Bipolar 1 disorder ([place])   . History of alcohol abuse   . History of hepatitis C   . Hypertension   . History of MRSA infection 2013  . History of diverticulitis 2013  . H/O suicide attempt     slit wrists  . Osteoporosis   . Arthritis     rheumatoid arthritis  . Sleep apnea     Pt discontinue use of  CPAP last year, Dx 3 years ago.   . OSA (obstructive sleep apnea)     managed by Dr. Manuella Ghazi  . Hypothyroidism   . Multinodular thyroid   . Vitamin D deficiency disease   . Vitamin B12 deficiency   . Post-traumatic osteoarthritis of right knee 09/24/2015  . Diverticulitis   . Asthma    Past Surgical History  Procedure Laterality Date  . Salpingoophorectomy Bilateral 11-13-2011  . Breast surgery Left 20 yrs ago  . Cesarean section    . Hernia repair  06/2011, July 2014    Ventral wall repair with Physiomesh  . Bilateral salpingoophorectomy  July 2013    due to abnormal mass   Family History  Problem Relation Age of Onset  . Alcohol abuse Father   . Arthritis Mother   . Asthma Mother   . Mental illness Mother   . Thyroid disease Mother   . COPD Mother   . Heart disease Mother   . Congestive Heart Failure Mother   . Arthritis Brother   . Mental illness  Brother   . Cancer Brother     non-hodkins lymphoma  . Alcohol abuse Sister   . Drug abuse Sister   . Mental illness Sister   . Diabetes Neg Hx   . Stroke Neg Hx    Social History  Substance Use Topics  . Smoking status: Current Every Day Smoker -- 0.50 packs/day for 30 years    Types: Cigarettes  . Smokeless tobacco: Never Used  . Alcohol Use: No   Interim medical history since last visit reviewed. Allergies and medications reviewed  Review of Systems Per HPI unless specifically indicated above     Objective:    BP 122/84 mmHg  Pulse 87  Temp(Src) 97.7 F (36.5 C) (Oral)  Resp 16  Wt 218 lb (98.884 kg)  SpO2 97%  Wt Readings from Last 3 Encounters:  10/23/15 218 lb (98.884 kg)  10/09/15 215 lb (97.523 kg)  10/06/15 216 lb (97.977 kg)    Physical Exam  Constitutional: She appears well-developed and well-nourished. No distress.  HENT:  Head: Normocephalic and atraumatic.  Eyes: EOM are normal. No scleral icterus.  Neck: No thyromegaly present.  Cardiovascular: Normal rate, regular rhythm and normal heart sounds.   No murmur heard. Pulmonary/Chest: Effort normal and breath sounds normal. No respiratory distress. She has no wheezes.  Abdominal: Soft. Bowel sounds are normal. She exhibits no distension. There is tenderness (epigastrium tender). There is no guarding.  Musculoskeletal: Normal range of motion. She exhibits no edema.  Neurological: She is alert.  Skin: Skin is warm and dry. She is not diaphoretic. No pallor.  Psychiatric: She has a normal mood and affect. Her behavior is normal. Judgment and thought content normal.   Results for orders placed or performed in visit on 10/09/15  CBC with Differential/Platelet  Result Value Ref Range   WBC 10.5 3.4 - 10.8 x10E3/uL   RBC 4.71 3.77 - 5.28 x10E6/uL   Hemoglobin 14.7 11.1 - 15.9 g/dL   Hematocrit 42.5 34.0 - 46.6 %   MCV 90 79 - 97 fL   MCH 31.2 26.6 - 33.0 pg   MCHC 34.6 31.5 - 35.7 g/dL   RDW 13.3  12.3 - 15.4 %   Platelets 241 150 - 379 x10E3/uL   Neutrophils 57 %   Lymphs 35 %   Monocytes 5 %   Eos 2 %   Basos 1 %   Neutrophils Absolute 6.1 1.4 -  7.0 x10E3/uL   Lymphocytes Absolute 3.6 (H) 0.7 - 3.1 x10E3/uL   Monocytes Absolute 0.5 0.1 - 0.9 x10E3/uL   EOS (ABSOLUTE) 0.2 0.0 - 0.4 x10E3/uL   Basophils Absolute 0.1 0.0 - 0.2 x10E3/uL   Immature Granulocytes 0 %   Immature Grans (Abs) 0.0 0.0 - 0.1 x10E3/uL  Hemoglobin A1c  Result Value Ref Range   Hgb A1c MFr Bld 6.1 (H) 4.8 - 5.6 %   Est. average glucose Bld gHb Est-mCnc 128 mg/dL  Lipid Panel w/o Chol/HDL Ratio  Result Value Ref Range   Cholesterol, Total 182 100 - 199 mg/dL   Triglycerides 106 0 - 149 mg/dL   HDL 42 >39 mg/dL   VLDL Cholesterol Cal 21 5 - 40 mg/dL   LDL Calculated 119 (H) 0 - 99 mg/dL  Comprehensive metabolic panel  Result Value Ref Range   Glucose 92 65 - 99 mg/dL   BUN 8 6 - 24 mg/dL   Creatinine, Ser 0.90 0.57 - 1.00 mg/dL   GFR calc non Af Amer 73 >59 mL/min/1.73   GFR calc Af Amer 84 >59 mL/min/1.73   BUN/Creatinine Ratio 9 9 - 23   Sodium 139 134 - 144 mmol/L   Potassium 4.4 3.5 - 5.2 mmol/L   Chloride 99 96 - 106 mmol/L   CO2 24 18 - 29 mmol/L   Calcium 9.3 8.7 - 10.2 mg/dL   Total Protein 6.8 6.0 - 8.5 g/dL   Albumin 4.2 3.5 - 5.5 g/dL   Globulin, Total 2.6 1.5 - 4.5 g/dL   Albumin/Globulin Ratio 1.6 1.2 - 2.2   Bilirubin Total 0.4 0.0 - 1.2 mg/dL   Alkaline Phosphatase 88 39 - 117 IU/L   AST 19 0 - 40 IU/L   ALT 18 0 - 32 IU/L  Lipase  Result Value Ref Range   Lipase 24 0 - 59 U/L      Assessment & Plan:   Problem List Items Addressed This Visit      Endocrine   Impaired fasting glucose    Weight loss key; next A1c due on or after April 10, 2016      Subclinical hyperthyroidism    Reviewed recent TSH readings; I will completely defer any thyroid medicine adjustments or considerations thereof to the endocrinologist; patient wants to try Synthroid, but she will need to  address that with her endocrinologist        Other   Abdominal pain - Primary    Still having abdominal pain; recent CT scan; refer to Dr. Jamal Collin; possible adhesions, so we'll start with surgeon; consider GI next; continue fiber and hydration; having good BMs so won't add anything for bowel regulation; no blood in stool; go to ER if needed      Relevant Orders   Ambulatory referral to General Surgery   Alcoholism in recovery [facility])    Patient was made aware earlier that I am not going to prescribe her any pain medicine; that is being managed by other providers; she did not ask today; this is just a reminder to myself and other providers of her history and her previous struggle with alcohol      Lymphocytosis    Glad to see this number came down; still elevated, but not trending upward; elevated lymph count in smoker could be sign of developing CLL; will follow      Obesity    Try calorie count and refer to nutritionist; try giving up all foods from cows and pigs (  hamburgers, bacon, sausage, cheese, ham, milk, butter, etc.); refer to nutritionist; consider calorie count      Relevant Orders   Referral to Nutrition and Diabetes Services   Tobacco dependence    I am here to help if/when she is ready to quit         Follow up plan: Return in about 3 months (around 01/23/2016) for regular follow-up.  An after-visit summary was printed and given to the patient at [facility].  Please see the patient instructions which may contain other information and recommendations beyond what is mentioned above in the assessment and plan.  Meds ordered this encounter  Medications  . Cyanocobalamin (RA VITAMIN B-12 TR) 1000 MCG TBCR    Sig: Take by mouth.    Orders Placed This Encounter  Procedures  . Ambulatory referral to General Surgery  . Referral to Nutrition and Diabetes Services

## 2015-10-23 NOTE — Assessment & Plan Note (Addendum)
Try calorie count and refer to nutritionist; try giving up all foods from cows and pigs (hamburgers, bacon, sausage, cheese, ham, milk, butter, etc.); refer to nutritionist; consider calorie count

## 2015-10-24 NOTE — Assessment & Plan Note (Signed)
Patient was made aware earlier that I am not going to prescribe her any pain medicine; that is being managed by other providers; she did not ask today; this is just a reminder to myself and other providers of her history and her previous struggle with alcohol

## 2015-10-24 NOTE — Assessment & Plan Note (Signed)
Reviewed recent TSH readings; I will completely defer any thyroid medicine adjustments or considerations thereof to the endocrinologist; patient wants to try Synthroid, but she will need to address that with her endocrinologist

## 2015-10-24 NOTE — Assessment & Plan Note (Signed)
Glad to see this number came down; still elevated, but not trending upward; elevated lymph count in smoker could be sign of developing CLL; will follow

## 2015-10-24 NOTE — Assessment & Plan Note (Signed)
Weight loss key; next A1c due on or after April 10, 2016

## 2015-10-24 NOTE — Assessment & Plan Note (Signed)
I am here to help if/when she is ready to quit

## 2015-10-26 ENCOUNTER — Telehealth: Payer: Self-pay | Admitting: Family Medicine

## 2015-10-26 DIAGNOSIS — R262 Difficulty in walking, not elsewhere classified: Secondary | ICD-10-CM | POA: Diagnosis not present

## 2015-10-26 DIAGNOSIS — M2391 Unspecified internal derangement of right knee: Secondary | ICD-10-CM | POA: Diagnosis not present

## 2015-10-26 DIAGNOSIS — M25361 Other instability, right knee: Secondary | ICD-10-CM | POA: Diagnosis not present

## 2015-10-26 NOTE — Telephone Encounter (Signed)
PT SEEING DR Jamal Collin ON July 10 AND NEEDS [employer] [facility] REFERRAL . VENTRAL HERNIA IS THE PROCEDURE

## 2015-10-29 ENCOUNTER — Other Ambulatory Visit: Payer: Self-pay | Admitting: Family Medicine

## 2015-10-29 DIAGNOSIS — G4733 Obstructive sleep apnea (adult) (pediatric): Secondary | ICD-10-CM | POA: Diagnosis not present

## 2015-10-29 NOTE — Telephone Encounter (Signed)
Last K+ and Cr reviewed; Rx approved 

## 2015-11-02 ENCOUNTER — Telehealth: Payer: Self-pay | Admitting: Family Medicine

## 2015-11-02 NOTE — Telephone Encounter (Signed)
Per [employer] we are not able to do a retro referral.

## 2015-11-02 NOTE — Telephone Encounter (Signed)
We can't do this can we?

## 2015-11-02 NOTE — Telephone Encounter (Signed)
done

## 2015-11-02 NOTE — Telephone Encounter (Signed)
Teara from [facility] requesting return call. States a referral was never placed for the patient and wanted to know if you wanted to try to contact the insurance company to see if they will back pay. States patient had [employer] gold. Please return call to discuss [phone]

## 2015-11-05 DIAGNOSIS — M1711 Unilateral primary osteoarthritis, right knee: Secondary | ICD-10-CM | POA: Diagnosis not present

## 2015-11-05 DIAGNOSIS — G8929 Other chronic pain: Secondary | ICD-10-CM | POA: Diagnosis not present

## 2015-11-05 DIAGNOSIS — F4310 Post-traumatic stress disorder, unspecified: Secondary | ICD-10-CM | POA: Diagnosis not present

## 2015-11-05 DIAGNOSIS — M25561 Pain in right knee: Secondary | ICD-10-CM | POA: Diagnosis not present

## 2015-11-06 ENCOUNTER — Other Ambulatory Visit: Payer: Self-pay | Admitting: Family Medicine

## 2015-11-16 ENCOUNTER — Ambulatory Visit (INDEPENDENT_AMBULATORY_CARE_PROVIDER_SITE_OTHER): Payer: Commercial Managed Care - HMO | Admitting: General Surgery

## 2015-11-16 ENCOUNTER — Encounter: Payer: Self-pay | Admitting: General Surgery

## 2015-11-16 VITALS — BP 130/80 | HR 77 | Resp 14 | Ht 62.0 in | Wt 218.0 lb

## 2015-11-16 DIAGNOSIS — K432 Incisional hernia without obstruction or gangrene: Secondary | ICD-10-CM | POA: Diagnosis not present

## 2015-11-16 NOTE — Patient Instructions (Addendum)
Open Hernia Repair Open hernia repair is surgery to fix a hernia. A hernia occurs when an internal organ or tissue pushes out through a weak spot in the abdominal wall muscles. Hernias commonly occur in the groin and around the navel. Most hernias tend to get worse over time. Surgery is often done to prevent the hernia from getting bigger, becoming uncomfortable, or becoming an emergency. Emergency surgery may be needed if abdominal contents get stuck in the opening (incarcerated hernia) or the blood supply gets cut off (strangulated hernia). In an open repair, a large cut (incision) is made in the abdomen to perform the surgery. LET [facility] CARE PROVIDER KNOW ABOUT:  Any allergies you have.  All medicines you are taking, including vitamins, herbs, eye drops, creams, and over-the-counter medicines.  Previous problems you or members of your family have had with the use of anesthetics.  Any blood disorders you have.  Previous surgeries you have had.  Medical conditions you have. RISKS AND COMPLICATIONS Generally, this is a safe procedure. However, as with any procedure, complications can occur. Possible complications include:  Infection.  Bleeding.  Nerve injury.  Chronic pain.  The hernia can come back.  Injury to the intestines. BEFORE THE PROCEDURE  Ask your health care provider about changing or stopping any regular medicines. Avoid taking aspirin or blood thinners as directed by your health care provider.  Do noteat or drink anything after midnight the night before surgery.  If you smoke, do not smoke for at least 2 weeks before your surgery.  Do not drink alcohol the day before your surgery.  Let your health care provider know if you develop a cold or any infection before your surgery.  Arrange for someone to drive you home after the procedure or after your hospital stay. Also arrange for someone to help you with activities during recovery. PROCEDURE   Small  monitors will be put on your body. They are used to check your heart, blood pressure, and oxygen level.   An IV access tube will be put into one of your veins. Medicine will be able to flow directly into your body through this IV tube.   You might be given a medicine to help you relax (sedative).   You will be given a medicine to make you sleep (general anesthetic). A breathing tube may be placed into your lungs during the procedure.  A cut (incision) is made over the hernia defect, and the contents are pushed back into the abdomen.  If the hernia is small, stitches may be used to bring the muscle edges back together.  Typically, a surgeon will place a mesh patch made of man-made material (synthetic) to cover the defect. The mesh is sewn to healthy muscle. This reduces the risk of the hernia coming back.  The tissue and skin over the hernia are then closed with stitches or staples.  If the hernia was large, a drain may be left in place to collect excess fluid where the hernia used to be.  Bandages (dressings) are used to cover the incision. AFTER THE PROCEDURE  You will be taken to a recovery area where your progress will be monitored.  If the hernia was small or in the groin (inguinal) region, you will likely be allowed to go home once you are awake, stable, and taking fluids well.  If the hernia was large, you may have to wait for your bowel function to return. You may need to stay in the hospital  for 2-3 days until you can eat and your pain is controlled. A drain may be left in place for 5-7 days. You will be taught how to care for the drain.   This information is not intended to replace advice given to you by your health care provider. Make sure you discuss any questions you have with your health care provider.   Document Released: 10/19/2000 Document Revised: 02/13/2013 Document Reviewed: 12/05/2012 Elsevier Interactive Patient Education 2016 [employer].    Need to stop  smoking and be seen by Dr Sanda Klein about your weight loss and nutrition prior to surgery.

## 2015-11-16 NOTE — Progress Notes (Signed)
Patient ID: Susan [name], female   DOB: 26-Jan-1961, 55 y.o.   MRN: [id number]  Chief Complaint  Patient presents with  . Other    ventral hernia    HPI Susan [name] is a 55 y.o. female here today for a evaluation of a recurrent ventral hernia. She reports pain that comes and goes. Patient had a ct scan done on 10/09/15. Ventral hernia repair was done in 06/2011, and 11/2012. She also reports that she has many lumps across her abdomen that she is concerned about. She reports that at times her whole upper abdominal area will get very hard.  HPI  Past Medical History  Diagnosis Date  . Thyroid disease     hyperthyroid  . 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 Hepatitis C   . Depression   . Anxiety   . Bipolar 1 disorder ([place])   . History of alcohol abuse   . History of hepatitis C   . Hypertension   . History of MRSA infection 2013  . History of diverticulitis 2013  . H/O suicide attempt     slit wrists  . Osteoporosis   . Arthritis     rheumatoid arthritis  . Sleep apnea     Pt discontinue use of CPAP last year, Dx 3 years ago.   . OSA (obstructive sleep apnea)     managed by Dr. Manuella Ghazi  . Hypothyroidism   . Multinodular thyroid   . Vitamin D deficiency disease   . Vitamin B12 deficiency   . Post-traumatic osteoarthritis of right knee 09/24/2015  . Diverticulitis   . Asthma     Past Surgical History  Procedure Laterality Date  . Salpingoophorectomy Bilateral 11-13-2011  . Breast surgery Left 20 yrs ago  . Cesarean section    . Hernia repair  06/2011, July 2014    Ventral wall repair with Physiomesh  . Bilateral salpingoophorectomy  July 2013    due to abnormal mass    Family History  Problem Relation Age of Onset  . Alcohol abuse Father   . Arthritis Mother   . Asthma Mother   . Mental illness Mother   . Thyroid disease Mother   . COPD Mother   . Heart disease Mother   . Congestive Heart Failure Mother   . Arthritis Brother   . Mental illness Brother   . Cancer Brother      non-hodkins lymphoma  . Alcohol abuse Sister   . Drug abuse Sister   . Mental illness Sister   . Diabetes Neg Hx   . Stroke Neg Hx     Social History Social History  Substance Use Topics  . Smoking status: Current Every Day Smoker -- 0.50 packs/day for 30 years    Types: Cigarettes  . Smokeless tobacco: Never Used  . Alcohol Use: No    Allergies  Allergen Reactions  . Hydrochlorothiazide Other (See Comments)    Electrolyte imbalance  . Lasix [Furosemide]     Electrolyte imbalance    Current Outpatient Prescriptions  Medication Sig Dispense Refill  . albuterol (VENTOLIN HFA) 108 (90 BASE) MCG/ACT inhaler Inhale 2 puffs into the lungs every 4 (four) hours as needed for wheezing or shortness of breath. 18 g 2  . amLODipine (NORVASC) 10 MG tablet Take 1 tablet (10 mg total) by mouth daily. 30 tablet 5  . atenolol (TENORMIN) 25 MG tablet TAKE 1 TABLET BY MOUTH DAILY. 30 tablet 11  . busPIRone (BUSPAR) 15 MG tablet TAKE  1 TABLET BY MOUTH IN THE MORNING & TAKE 1 & 1/2 TABLET EACH EVENING 75 tablet 6  . clonazePAM (KLONOPIN) 0.5 MG tablet Take 0.5 mg by mouth 3 (three) times daily as needed for anxiety.    . Cyanocobalamin (RA VITAMIN B-12 TR) 1000 MCG TBCR Take by mouth.    . cyclobenzaprine (FLEXERIL) 5 MG tablet Take 5 mg by mouth daily as needed.     . DULoxetine (CYMBALTA) 60 MG capsule Take 120 mg by mouth daily.     Water engineer Bandages & Supports (ACE KNEE BRACE W/STABILIZERS) MISC Knee brace with stabilizers, RIGHT knee; suspect internal derangement 1 each 0  . gabapentin (NEURONTIN) 300 MG capsule Take 600 mg by mouth 2 (two) times daily.     Marland Kitchen losartan (COZAAR) 100 MG tablet TAKE 1 TABLET BY MOUTH DAILY. 30 tablet 4  . meloxicam (MOBIC) 15 MG tablet Take 1 tablet (15 mg total) by mouth daily. 30 tablet 0  . Misc. Devices (CANE) MISC Right knee instability; use for stability 1 each 0  . omeprazole (PRILOSEC) 20 MG capsule Take 1 capsule (20 mg total) by mouth daily as  needed. Try to skip 1 or 2 days each week; risks with prolonged use 30 capsule 0  . oxyCODONE-acetaminophen (ROXICET) 5-325 MG tablet Take 1-2 tablets by mouth every 4 (four) hours as needed for severe pain. 15 tablet 0  . traMADol (ULTRAM) 50 MG tablet Take 100 mg by mouth every 6 (six) hours as needed.      No current facility-administered medications for this visit.    Review of Systems Review of Systems  Constitutional: Negative.   Respiratory: Negative.   Cardiovascular: Negative.   Gastrointestinal: Negative.     Blood pressure 130/80, pulse 77, resp. rate 14, height 5\' 2"  (1.575 m), weight 218 lb (98.884 kg).  Physical Exam Physical Exam  Constitutional: She is oriented to person, place, and time. She appears well-developed and well-nourished.  Eyes: Conjunctivae are normal. No scleral icterus.  Neck: Neck supple.  Cardiovascular: Normal rate, regular rhythm and normal heart sounds.   Pulmonary/Chest: Effort normal and breath sounds normal.  Abdominal: Soft. Bowel sounds are normal. There is no hepatosplenomegaly. There is tenderness. A hernia (2) is present. Hernia confirmed positive in the ventral area (recurrent ).    Lymphadenopathy:    She has no cervical adenopathy.  Neurological: She is alert and oriented to person, place, and time.  Skin: Skin is warm and dry.    Data Reviewed Prior notes. Last time she had lap repair with Physio mesh. Recent abdominal CT reviewed. There is a 1.3cm mid epigastric fascial defect with a fatty hernia. Below umbilicus there is a 5cm wide, 3-4cm long defect with bowel seen in hernia. Assessment    Recurrent ventral hernia. Pt has multiple risk factors-weight, smoking, diabetes. At this point repair is best achieved with component separation and retrorectus mesh placement.     Plan   Discussed fully with pt. Recommend she discuss her weight, smoking and diabetic control with Dr. Sanda Klein. If she quits smoking and makes a honest  attempt to lose some weight will discuss surgical repair .  Pt is agreeable to this.  She is to see Dr. Sanda Klein and see me back in 2-3 mos. Advised to call if she has any new symptoms with the hernia      PCP:  Enid Derry P This information has been scribed by Gaspar Cola CMA.     SANKAR,SEEPLAPUTHUR G  11/18/2015, 8:19 AM

## 2015-11-17 ENCOUNTER — Ambulatory Visit: Payer: Commercial Managed Care - HMO | Admitting: Family Medicine

## 2015-11-18 ENCOUNTER — Encounter: Payer: Self-pay | Admitting: General Surgery

## 2015-11-26 DIAGNOSIS — F4310 Post-traumatic stress disorder, unspecified: Secondary | ICD-10-CM | POA: Diagnosis not present

## 2015-11-28 DIAGNOSIS — G4733 Obstructive sleep apnea (adult) (pediatric): Secondary | ICD-10-CM | POA: Diagnosis not present

## 2015-11-30 DIAGNOSIS — M5416 Radiculopathy, lumbar region: Secondary | ICD-10-CM | POA: Diagnosis not present

## 2015-11-30 DIAGNOSIS — M5136 Other intervertebral disc degeneration, lumbar region: Secondary | ICD-10-CM | POA: Diagnosis not present

## 2015-11-30 DIAGNOSIS — M4806 Spinal stenosis, lumbar region: Secondary | ICD-10-CM | POA: Diagnosis not present

## 2015-12-02 DIAGNOSIS — F4310 Post-traumatic stress disorder, unspecified: Secondary | ICD-10-CM | POA: Diagnosis not present

## 2015-12-03 ENCOUNTER — Encounter: Payer: Medicare Other | Attending: Family Medicine | Admitting: Dietician

## 2015-12-03 VITALS — Ht 62.0 in | Wt 216.1 lb

## 2015-12-03 DIAGNOSIS — Z713 Dietary counseling and surveillance: Secondary | ICD-10-CM | POA: Insufficient documentation

## 2015-12-03 DIAGNOSIS — E669 Obesity, unspecified: Secondary | ICD-10-CM | POA: Insufficient documentation

## 2015-12-03 DIAGNOSIS — R7303 Prediabetes: Secondary | ICD-10-CM | POA: Insufficient documentation

## 2015-12-03 NOTE — Patient Instructions (Signed)
   Plan to have something to eat every 4-5 hours during the day, even if a small amount of food.   Include a small- medium amount of protein, a handful-size up to fist-size portion of starch and fruit, and plenty of low-cal veggies with your meals. Use menus for balanced meal ideas.  Keep making healthy food choices, you are doing a good job! And continue your regular exercise.

## 2015-12-03 NOTE — Progress Notes (Signed)
Medical Nutrition Therapy: Visit start time: 1330  end time: 1430  Assessment:  Diagnosis: obesity Past medical history: pre-diabetes, HTN, CHF, sleep apnea, diverticulosis Psychosocial issues/ stress concerns: bipolar disorder, is taking meds and seeing therapist Preferred learning method:  . Auditory . Hands-on  Current weight: 216.1lbs  Height: 5'2" Medications, supplements: reviewed list in chart with patient  Progress and evaluation: Patient reports difficulty losing any weight despite efforts to do so with exercise and diet changes. She needs to lose weight prior to having  (?knee) surgery. She was drinking heavily, as many as 18-20 beers a day, but has stopped about 1 year ago, and has not lost any weight. Husband from whom she was separated but maintaining a good relationship, died several months ago. She states she is not typically hungry at all, and usually makes herself to eat 2-3 times a day.   Physical activity: walking, swimming, general activity daily. Reports almost constant moving from 5:30am until 11pm  Dietary Intake:  Usual eating pattern includes 3 meals and 0 snacks per day. Dining out frequency: 3-4 meals per week.  Breakfast: cereal Snack: none Lunch: usually sandwich with [place], peanut butter, hot dog; sometimes skips Snack: none Supper: sometimes sandwich, 7/26 1 pork fajita. Often cooks dinner for family/ houseguests, but might just eat a sandwich Snack: none Beverages: mostly tea with equal sweetener for past 4 months.  * she might go from 6:30am until 8 or 9pm without anything to eat.  Nutrition Care Education: Topics covered: weight management Basic nutrition: basic food groups, appropriate nutrient balance, appropriate meal and snack schedule, general nutrition guidelines    Weight control: factors influencing weight and metabolism including stress; behavioral changes for weight loss; guidance for 1300kcal meal plan and menu options; importance of low  fat and low sugar foods, but allowing for occasional treats.  Diabetes prevention:  appropriate meal and snack schedule, appropriate carb intake and balance  Nutritional Diagnosis:  [place]-3.3 Overweight/obesity As related to history of excess calories, erratic eating pattern, stress.  As evidenced by patient report.  Intervention: Instruction as noted above.   Initial goal is following a more structured, regular eating pattern.    Encouraged gradual, slow increase in caloric intake if needed.    Set goals with patient input.  Education Materials given:  . Food lists/ Planning A Balanced Meal . Sample meal pattern/ menus: Quick and Healthy Meal Ideas . Goals/ instructions  Learner/ who was taught:  . Patient   Level of understanding: Marland Kitchen Verbalizes/ demonstrates competency  Demonstrated degree of understanding via:   Teach back Learning barriers: . None  Willingness to learn/ readiness for change: . Eager, change in progress   Monitoring and Evaluation:  Dietary intake, exercise, and body weight      follow up: 01/01/16

## 2015-12-09 DIAGNOSIS — F4310 Post-traumatic stress disorder, unspecified: Secondary | ICD-10-CM | POA: Diagnosis not present

## 2015-12-10 ENCOUNTER — Other Ambulatory Visit: Payer: Self-pay | Admitting: Family Medicine

## 2015-12-11 NOTE — Telephone Encounter (Signed)
Pt needs to be weaning; 26 pills to last at least 30 days

## 2015-12-16 DIAGNOSIS — F4310 Post-traumatic stress disorder, unspecified: Secondary | ICD-10-CM | POA: Diagnosis not present

## 2015-12-17 ENCOUNTER — Ambulatory Visit (INDEPENDENT_AMBULATORY_CARE_PROVIDER_SITE_OTHER): Payer: Commercial Managed Care - HMO | Admitting: Family Medicine

## 2015-12-17 ENCOUNTER — Encounter: Payer: Self-pay | Admitting: Family Medicine

## 2015-12-17 VITALS — BP 126/68 | HR 100 | Temp 98.2°F | Resp 16 | Ht 62.0 in | Wt 218.3 lb

## 2015-12-17 DIAGNOSIS — E669 Obesity, unspecified: Secondary | ICD-10-CM

## 2015-12-17 DIAGNOSIS — M6208 Separation of muscle (nontraumatic), other site: Secondary | ICD-10-CM

## 2015-12-17 DIAGNOSIS — L089 Local infection of the skin and subcutaneous tissue, unspecified: Secondary | ICD-10-CM | POA: Diagnosis not present

## 2015-12-17 DIAGNOSIS — E0590 Thyrotoxicosis, unspecified without thyrotoxic crisis or storm: Secondary | ICD-10-CM | POA: Diagnosis not present

## 2015-12-17 DIAGNOSIS — M4806 Spinal stenosis, lumbar region: Secondary | ICD-10-CM | POA: Diagnosis not present

## 2015-12-17 DIAGNOSIS — M5416 Radiculopathy, lumbar region: Secondary | ICD-10-CM | POA: Diagnosis not present

## 2015-12-17 DIAGNOSIS — E559 Vitamin D deficiency, unspecified: Secondary | ICD-10-CM | POA: Diagnosis not present

## 2015-12-17 DIAGNOSIS — K219 Gastro-esophageal reflux disease without esophagitis: Secondary | ICD-10-CM | POA: Diagnosis not present

## 2015-12-17 DIAGNOSIS — R234 Changes in skin texture: Secondary | ICD-10-CM

## 2015-12-17 DIAGNOSIS — L989 Disorder of the skin and subcutaneous tissue, unspecified: Secondary | ICD-10-CM

## 2015-12-17 DIAGNOSIS — L918 Other hypertrophic disorders of the skin: Secondary | ICD-10-CM

## 2015-12-17 DIAGNOSIS — M5136 Other intervertebral disc degeneration, lumbar region: Secondary | ICD-10-CM | POA: Diagnosis not present

## 2015-12-17 LAB — T3, FREE: T3, Free: 2.7 pg/mL (ref 2.3–4.2)

## 2015-12-17 LAB — TSH: TSH: 0.39 mIU/L — ABNORMAL LOW

## 2015-12-17 LAB — T4, FREE: Free T4: 1.1 ng/dL (ref 0.8–1.8)

## 2015-12-17 NOTE — Assessment & Plan Note (Addendum)
She is frustrated about her weight; I hear and acknowledge her frustration; however, putting her on thyroid medicine for weight loss is not the answer; she pressed to check her thyroid function today, even though endocrinologist was due to recheck next month; I would not start medicine and would defer to endo; unnecessary thyroid replacement can accelerate bone loss and cause a host of problems; instead, I urged patient to keep up her activity and try to work on reducing empty calories, total calories, hydrating with 64 ounces of water a day, etc.

## 2015-12-17 NOTE — Patient Instructions (Addendum)
Caution: prolonged use of proton pump inhibitors like omeprazole (Prilosec), pantoprazole (Protonix), esomeprazole (Nexium), and others like Dexilant and Aciphex may increase your risk of pneumonia, Clostridium difficile colitis, osteoporosis, anemia and other health complications Try to limit or avoid triggers like coffee, caffeinated beverages, onions, chocolate, spicy foods, peppermint, acid foods like pizza, spaghetti sauce, and orange juice Lose weight if you are overweight or obese Try elevating the head of your bed by placing a small wedge between your mattress and box springs to keep acid in the stomach at night instead of coming up into your esophagus  With your PRE-diabetes, return for fasting labs on or after December 3rd Avoid white rice, white sugar, white potatoes, and white bread  Let's get labs today If needed, we can appeal the Saxenda  You have a condition called diastasis recti and you can do exercises to help with that

## 2015-12-17 NOTE — Assessment & Plan Note (Signed)
Check TSH, T3 and T4

## 2015-12-17 NOTE — Assessment & Plan Note (Signed)
Refer to derm; discussed friction

## 2015-12-17 NOTE — Assessment & Plan Note (Signed)
discussed risks; she'll try skipping 1-2 days a week, nonconsecutive; avoid triggers

## 2015-12-17 NOTE — Progress Notes (Signed)
BP 126/68 (BP Location: Left Arm, Patient Position: Sitting, Cuff Size: Large)   Pulse 100   Temp 98.2 F (36.8 C) (Oral)   Resp 16   Ht 5\' 2"  (1.575 m)   Wt 218 lb 4.8 oz (99 kg)   SpO2 96%   BMI 39.93 kg/m    Subjective:    Patient ID: Susan [name], female    DOB: [birth year], 55 y.o.   MRN: [id number]  HPI: Susan [name] is a 55 y.o. female  Chief Complaint  Patient presents with  . Referral    Has [facility] and needs referrals  . Knee Pain    Onset-years and has been seeing Dr. Mikle Bosworth for right knee pain  . Nevus    Patient has numerous moles all over her abdominal region and would like a referral to Orthopedics  . Foot Burn    Cracking of feet and pain, has been soaking them with no relief.    She is having cracking / fissures in her heels, and both 2nd toes; not sure why; chart review shows hx of vitamin D deficiency  She is not losing weight; she says her surgeon won't operate on her until she loses weight She had the radiation on her thyroid in Dec; sees endocrinologist; she says her thyroid tests were normal in February and goes back in September for more labs and [name]; she wants me to check it today and not wait She eats a little junk now that she doesn't drink; she can't imagine why she isn't losing weight; she does not drink enough water; does drink some water, but maybe not enough; push mowing, active; she saw the nutritionist, following her plan; insurance wouldn't cover the [place]; no thyroid cancer and no fam hx of MEN-2  She has heartburn; no Barretts esophagus; even just tea can be a trigger; has not picked up Rx from [facility], sent on Aug 4th  She has irritated skin tags under the breasts; has been to see [facility] and she'd like a referral to go back to them  Relevant past medical, surgical, family and social history reviewed Past Medical History:  Diagnosis Date  . Anxiety   . Arthritis    rheumatoid arthritis  . Asthma   .  Depression    bipolar, hx of suicide attempt  . H/O suicide attempt    slit wrists  . History of alcohol abuse   . History of diverticulitis 2013  . History of hepatitis C   . History of MRSA infection 2013  . Hypertension   . Hypothyroidism   . 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 Multinodular thyroid   . OSA (obstructive sleep apnea)    managed by Dr. Manuella Ghazi  . Osteoporosis   . Post-traumatic osteoarthritis of right knee 09/24/2015  . Sleep apnea    Pt discontinue use of CPAP last year, Dx 3 years ago.   . Thyroid disease   . Vitamin B12 deficiency   . Vitamin D deficiency disease    Past Surgical History:  Procedure Laterality Date  . BILATERAL SALPINGOOPHORECTOMY     due to abnormal mass  . BREAST SURGERY Left 20 yrs ago  . CESAREAN SECTION    . HERNIA REPAIR  06/2011, July 2014   Ventral wall repair with Physiomesh   Family History  Problem Relation Age of Onset  . Alcohol abuse Father   . Arthritis Mother   . Asthma Mother   . Mental illness Mother   .  Thyroid disease Mother   . COPD Mother   . Heart disease Mother   . Congestive Heart Failure Mother   . Arthritis Brother   . Mental illness Brother   . Cancer Brother     non-hodkins lymphoma  . Alcohol abuse Sister   . Drug abuse Sister   . Mental illness Sister   . Diabetes Neg Hx   . Stroke Neg Hx    Social History  Substance Use Topics  . Smoking status: Current Every Day Smoker    Packs/day: 0.50    Years: 30.00    Types: Cigarettes  . Smokeless tobacco: Never Used  . Alcohol use No    Interim medical history since last visit reviewed. Allergies and medications reviewed  Review of Systems Per HPI unless specifically indicated above     Objective:    BP 126/68 (BP Location: Left Arm, Patient Position: Sitting, Cuff Size: Large)   Pulse 100   Temp 98.2 F (36.8 C) (Oral)   Resp 16   Ht 5\' 2"  (1.575 m)   Wt 218 lb 4.8 oz (99 kg)   SpO2 96%   BMI 39.93 kg/m   Wt Readings from Last 3 Encounters:    12/17/15 218 lb 4.8 oz (99 kg)  12/03/15 216 lb 1.6 oz (98 kg)  11/16/15 218 lb (98.9 kg)    Physical Exam  Constitutional: She appears well-developed and well-nourished. No distress.  HENT:  Head: Normocephalic and atraumatic.  Eyes: EOM are normal. No scleral icterus.  Neck: No thyromegaly present.  Cardiovascular: Normal rate, regular rhythm and normal heart sounds.   No murmur heard. Pulmonary/Chest: Effort normal and breath sounds normal. She has no wheezes.  Abdominal: Soft. Bowel sounds are normal. She exhibits no distension. A hernia (diastasis recti) is present.  Musculoskeletal: Normal range of motion. She exhibits no edema.  Neurological: She is alert.  Skin: Skin is warm and dry. She is not diaphoretic. No pallor.  Fissures in both heels around the periphery; no drainage; fissures at the tips of both 2nd toes; skin rather dry; numerous skin tags under the breasts  Psychiatric: She has a normal mood and affect. Her behavior is normal. Judgment and thought content normal.      Assessment & Plan:   Problem List Items Addressed This Visit      Digestive   GERD (gastroesophageal reflux disease)    discussed risks; she'll try skipping 1-2 days a week, nonconsecutive; avoid triggers        Endocrine   Subclinical hyperthyroidism    Check TSH, T3 and T4      Relevant Orders   T3, free (Completed)   T4, free (Completed)   TSH (Completed)     Musculoskeletal and Integument   Inflamed skin tag    Refer to derm; discussed friction      Diastasis recti    Explained dx; try abdominal exercises to strengthen core        Other   Vitamin D deficiency    Very low in Dec and I think that's why the feet are cracking; check level      Relevant Orders   VITAMIN D 25 Hydroxy (Vit-D Deficiency, Fractures) (Completed)   Obesity    She is frustrated about her weight; I hear and acknowledge her frustration; however, putting her on thyroid medicine for weight loss is not  the answer; she pressed to check her thyroid function today, even though endocrinologist was due to recheck next  month; I would not start medicine and would defer to endo; unnecessary thyroid replacement can accelerate bone loss and cause a host of problems; instead, I urged patient to keep up her activity and try to work on reducing empty calories, total calories, hydrating with 64 ounces of water a day, etc.       Other Visit Diagnoses    Fissure in skin of foot    -  Primary   watch for infection; neosporin; check vit D       Follow up plan: Return in about 4 months (around 04/11/2016) for fasting labs and visit with Dr. Sanda Klein.  An after-visit summary was printed and given to the patient at [facility].  Please see the patient instructions which may contain other information and recommendations beyond what is mentioned above in the assessment and plan.  No orders of the defined types were placed in this encounter.   Orders Placed This Encounter  Procedures  . VITAMIN D 25 Hydroxy (Vit-D Deficiency, Fractures)  . T3, free  . T4, free  . TSH

## 2015-12-17 NOTE — Assessment & Plan Note (Signed)
Very low in Dec and I think that's why the feet are cracking; check level

## 2015-12-18 ENCOUNTER — Other Ambulatory Visit: Payer: Self-pay | Admitting: Family Medicine

## 2015-12-18 ENCOUNTER — Encounter: Payer: Self-pay | Admitting: Family Medicine

## 2015-12-18 DIAGNOSIS — M6208 Separation of muscle (nontraumatic), other site: Secondary | ICD-10-CM | POA: Insufficient documentation

## 2015-12-18 LAB — VITAMIN D 25 HYDROXY (VIT D DEFICIENCY, FRACTURES): Vit D, 25-Hydroxy: 16 ng/mL — ABNORMAL LOW (ref 30–100)

## 2015-12-18 MED ORDER — VITAMIN D (ERGOCALCIFEROL) 1.25 MG (50000 UNIT) PO CAPS: 50000.0000 [IU] | ORAL_CAPSULE | ORAL | 1 refills | Status: AC

## 2015-12-18 NOTE — Assessment & Plan Note (Signed)
Explained dx; try abdominal exercises to strengthen core

## 2015-12-18 NOTE — Progress Notes (Signed)
Start vit D Rx weekly x 8 weeks, then 1000 iu daily (see note to pt in lab section)

## 2015-12-29 ENCOUNTER — Encounter: Payer: Self-pay | Admitting: *Deleted

## 2015-12-29 ENCOUNTER — Emergency Department: Payer: Commercial Managed Care - HMO

## 2015-12-29 ENCOUNTER — Emergency Department
Admission: EM | Admit: 2015-12-29 | Discharge: 2015-12-29 | Disposition: A | Discharge status: Home / Self Care | Payer: Commercial Managed Care - HMO | Attending: Emergency Medicine | Admitting: Emergency Medicine

## 2015-12-29 DIAGNOSIS — J45909 Unspecified asthma, uncomplicated: Secondary | ICD-10-CM | POA: Insufficient documentation

## 2015-12-29 DIAGNOSIS — M7989 Other specified soft tissue disorders: Secondary | ICD-10-CM | POA: Diagnosis not present

## 2015-12-29 DIAGNOSIS — M25569 Pain in unspecified knee: Secondary | ICD-10-CM

## 2015-12-29 DIAGNOSIS — F17210 Nicotine dependence, cigarettes, uncomplicated: Secondary | ICD-10-CM | POA: Insufficient documentation

## 2015-12-29 DIAGNOSIS — I10 Essential (primary) hypertension: Secondary | ICD-10-CM | POA: Diagnosis not present

## 2015-12-29 DIAGNOSIS — E039 Hypothyroidism, unspecified: Secondary | ICD-10-CM | POA: Insufficient documentation

## 2015-12-29 DIAGNOSIS — M25561 Pain in right knee: Secondary | ICD-10-CM

## 2015-12-29 DIAGNOSIS — G4733 Obstructive sleep apnea (adult) (pediatric): Secondary | ICD-10-CM | POA: Diagnosis not present

## 2015-12-29 NOTE — ED Triage Notes (Signed)
Pt complains of right knee pain and swelling for the last 6 moths, pt reports increased pain and swelling

## 2015-12-29 NOTE — ED Notes (Signed)
States she has been having right knee pain for 2-3 days   Has had recent steroid shot to same knee but noticed increased pain and swelling to lower leg  Positive pulses

## 2015-12-29 NOTE — ED Provider Notes (Signed)
[facility] Provider Note   CSN: [id number] Arrival date & time: 12/29/15  0805     History   Chief Complaint Chief Complaint  Patient presents with  . Knee Pain    HPI Susan [name] is a 55 y.o. female.  55 year old female presents today complaining of posterior knee pain and calf pain. Pt states the pain began yesterday and she noticed some swelling to her knee joint and her popliteal space. Has not had any difficulty breathing or chest pain. No recent travel, hospitalization or surgery. No personal history of DVT or PE. Pt is being followed by Dr. Morley Kos, orthopedist for knee pain but he could not see her until 8/30. She is concerned she may have developed a blood clot and was referred here for evaluation.    The history is provided by the patient.  Knee Pain   This is a recurrent problem. The problem occurs constantly. The problem has been gradually worsening. The pain is present in the right knee and right lower leg. The quality of the pain is described as aching. The pain is moderate. Associated symptoms include numbness, stiffness and tingling. The symptoms are aggravated by activity and standing. She has tried nothing for the symptoms. There has been no history of extremity trauma.    Past Medical History:  Diagnosis Date  . Anxiety   . Arthritis    rheumatoid arthritis  . Asthma   . Depression    bipolar, hx of suicide attempt  . H/O suicide attempt    slit wrists  . History of alcohol abuse   . History of diverticulitis 2013  . History of hepatitis C   . History of MRSA infection 2013  . Hypertension   . Hypothyroidism   . 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 Multinodular thyroid   . OSA (obstructive sleep apnea)    managed by Dr. Manuella Ghazi  . Osteoporosis   . Post-traumatic osteoarthritis of right knee 09/24/2015  . Sleep apnea    Pt discontinue use of CPAP last year, Dx 3 years ago.   . Thyroid disease   . Vitamin B12 deficiency   . Vitamin D  deficiency disease     Patient Active Problem List   Diagnosis Date Noted  . Diastasis recti 12/18/2015  . GERD (gastroesophageal reflux disease) 12/17/2015  . Inflamed skin tag 12/17/2015  . Abdominal pain 10/09/2015  . Chronic back pain 10/09/2015  . Post-traumatic osteoarthritis of right knee 09/24/2015  . Obesity 08/06/2015  . Pain in right knee 08/03/2015  . Postmenopausal 07/08/2015  . Preventative health care 07/08/2015  . Cervical cancer screening 07/08/2015  . Alcoholism in recovery ([place]) 04/13/2015  . Lymphocytosis 04/10/2015  . Impaired fasting glucose 04/10/2015  . Need for Streptococcus pneumoniae and influenza vaccination 04/09/2015  . Polyuria 04/09/2015  . Vitamin D deficiency 04/09/2015  . Vitamin B12 deficiency 04/09/2015  . Medication monitoring encounter 04/09/2015  . OSA on CPAP 04/09/2015  . Breast cancer screening 04/09/2015  . Subclinical hyperthyroidism 06/26/2014  . Toxic multinodular goiter 06/26/2014  . Hypertension 06/26/2014  . Hepatitis C 06/26/2014  . Tobacco dependence 06/26/2014  . Bipolar disorder ([place]) 06/26/2014  . Recurrent ventral hernia 11/08/2012    Past Surgical History:  Procedure Laterality Date  . BILATERAL SALPINGOOPHORECTOMY     due to abnormal mass  . BREAST SURGERY Left 20 yrs ago  . CESAREAN SECTION    . HERNIA REPAIR  06/2011, July 2014   Ventral wall  repair with Physiomesh    OB History    Gravida Para Term Preterm AB Living   3 3       2    SAB TAB Ectopic Multiple Live Births                  Obstetric Comments   Menstrual age: 14  Age 1st Pregnancy: 46       Home Medications    Prior to Admission medications   Medication Sig Start Date End Date Taking? Authorizing Provider  albuterol (VENTOLIN HFA) 108 (90 BASE) MCG/ACT inhaler Inhale 2 puffs into the lungs every 4 (four) hours as needed for wheezing or shortness of breath. 04/09/15   Arnetha Courser, MD  amLODipine (NORVASC) 10 MG tablet Take 1 tablet  (10 mg total) by mouth daily. 07/08/15   Arnetha Courser, MD  atenolol (TENORMIN) 25 MG tablet TAKE 1 TABLET BY MOUTH DAILY. 05/06/15   Arnetha Courser, MD  busPIRone (BUSPAR) 15 MG tablet TAKE 1 TABLET BY MOUTH IN THE MORNING & TAKE 1 & 1/2 TABLET EACH EVENING 08/31/15   Arnetha Courser, MD  clonazePAM (KLONOPIN) 0.5 MG tablet Take 0.5 mg by mouth 3 (three) times daily as needed for anxiety.    Historical Provider, MD  Cyanocobalamin (RA VITAMIN B-12 TR) 1000 MCG TBCR Take by mouth.    Historical Provider, MD  cyclobenzaprine (FLEXERIL) 5 MG tablet Take 5 mg by mouth daily as needed.  06/04/15   Historical Provider, MD  DULoxetine (CYMBALTA) 60 MG capsule Take 120 mg by mouth daily.  07/22/15   Historical Provider, MD  Elastic Bandages & Supports (ACE KNEE BRACE W/STABILIZERS) MISC Knee brace with stabilizers, RIGHT knee; suspect internal derangement 09/24/15   Arnetha Courser, MD  gabapentin (NEURONTIN) 300 MG capsule Take 600 mg by mouth 2 (two) times daily.     Historical Provider, MD  losartan (COZAAR) 100 MG tablet TAKE 1 TABLET BY MOUTH DAILY. 10/29/15   Arnetha Courser, MD  meloxicam (MOBIC) 15 MG tablet Take 1 tablet (15 mg total) by mouth daily. 07/30/15   Arlyss Repress, PA-C  Misc. Devices (CANE) MISC Right knee instability; use for stability 09/24/15   Arnetha Courser, MD  omeprazole (PRILOSEC) 20 MG capsule TAKE 1 CAPSULE BY MOUTH DAILY PRN; TRY TO SKIP 1 OR 2 DAYS EACH WEEK. 12/11/15   Arnetha Courser, MD  traMADol (ULTRAM) 50 MG tablet Take 100 mg by mouth every 6 (six) hours as needed.  07/02/15   Historical Provider, MD  Vitamin D, Ergocalciferol, (DRISDOL) 50000 units CAPS capsule Take 1 capsule (50,000 Units total) by mouth every 7 (seven) days. 12/18/15 02/12/16  Arnetha Courser, MD    Family History Family History  Problem Relation Age of Onset  . Alcohol abuse Father   . Arthritis Mother   . Asthma Mother   . Mental illness Mother   . Thyroid disease Mother   . COPD Mother   . Heart  disease Mother   . Congestive Heart Failure Mother   . Arthritis Brother   . Mental illness Brother   . Cancer Brother     non-hodkins lymphoma  . Alcohol abuse Sister   . Drug abuse Sister   . Mental illness Sister   . Diabetes Neg Hx   . Stroke Neg Hx     Social History Social History  Substance Use Topics  . Smoking status: Current Every Day Smoker  Packs/day: 0.50    Years: 30.00    Types: Cigarettes  . Smokeless tobacco: Never Used  . Alcohol use No     Allergies   Hydrochlorothiazide and Lasix [furosemide]   Review of Systems Review of Systems  Constitutional: Negative for chills and fever.  Musculoskeletal: Positive for arthralgias, joint swelling, myalgias and stiffness.  Skin: Negative for wound.  Neurological: Positive for tingling and numbness.  All other systems reviewed and are negative.    Physical Exam Updated Vital Signs BP 126/85 (BP Location: Right Arm)   Pulse 77   Temp 97.9 F (36.6 C) (Oral)   Resp 20   Ht 5\' 2"  (1.575 m)   Wt 99.8 kg   SpO2 96%   BMI 40.24 kg/m   Physical Exam  Constitutional: She appears well-developed and well-nourished.  HENT:  Head: Normocephalic and atraumatic.  Musculoskeletal: Normal range of motion. She exhibits tenderness.       Right knee: She exhibits swelling and effusion. She exhibits normal range of motion and normal meniscus. Tenderness found.  Negative lachman's, negative posterior and anterior drawer test, negative mcmurray's. Swelling noted to popliteal space. Right Calf TTP. Positive homan's sign. Intact distal pulses bilaterally. No pitting edema   Neurological: She is alert.  Skin: Skin is warm and dry.  Psychiatric: She has a normal mood and affect. Her behavior is normal. Judgment and thought content normal.  Nursing note and vitals reviewed.    ED Treatments / Results  Labs (all labs ordered are listed, but only abnormal results are displayed) Labs Reviewed - No data to  display  EKG  EKG Interpretation None       Radiology US Venous Img Lower Unilateral Right  Result Date: 12/29/2015 CLINICAL DATA:  Calf swelling, posterior right knee pain x6 months, worsening EXAM: RIGHT LOWER EXTREMITY VENOUS DOPPLER ULTRASOUND TECHNIQUE: Gray-scale sonography with compression, as well as color and duplex ultrasound, were performed to evaluate the deep venous system from the level of the common femoral vein through the popliteal and proximal calf veins. COMPARISON:  None FINDINGS: Normal compressibility of the common femoral, superficial femoral, and popliteal veins, as well as the proximal calf veins. No filling defects to suggest DVT on grayscale or color Doppler imaging. Doppler waveforms show normal direction of venous flow, normal respiratory phasicity and response to augmentation. No Baker's cyst identified. Survey views of the contralateral common femoral vein are unremarkable. IMPRESSION: No evidence of  lower extremity deep vein thrombosis. Electronically Signed   By: Lucrezia Europe M.D.   On: 12/29/2015 12:58    Procedures Procedures (including critical care time)  Medications Ordered in ED Medications - No data to display   Initial Impression / Assessment and Plan / ED Course  I have reviewed the triage vital signs and the nursing notes.  Pertinent labs & imaging results that were available during my care of the patient were reviewed by me and considered in my medical decision making (see chart for details).  Clinical Course    Reviewed xrays from 07/30/15 - significant degenerative changes to knee. No new injury, no indication for repeat xray. We will rule out blood clots - [name] for DVT ordered   Negative for DVT, continue follow up with orthopedist as planned.   Final Clinical Impressions(s) / ED Diagnoses   Final diagnoses:  Calf swelling  Posterior knee pain  Right knee pain    New Prescriptions Discharge Medication List as of 12/29/2015 11:42 AM  Harvest Dark, PA-C 12/29/15 1316    Earleen Newport, MD 12/29/15 [phone]

## 2015-12-30 ENCOUNTER — Ambulatory Visit: Payer: Medicare Other | Admitting: Dietician

## 2016-01-01 ENCOUNTER — Ambulatory Visit: Payer: Medicare Other | Admitting: Dietician

## 2016-01-04 ENCOUNTER — Encounter: Payer: Medicare Other | Attending: Family Medicine | Admitting: Dietician

## 2016-01-04 ENCOUNTER — Encounter: Payer: Self-pay | Admitting: Dietician

## 2016-01-04 VITALS — Ht 62.0 in | Wt 213.3 lb

## 2016-01-04 DIAGNOSIS — E669 Obesity, unspecified: Secondary | ICD-10-CM | POA: Diagnosis not present

## 2016-01-04 DIAGNOSIS — Z6839 Body mass index (BMI) 39.0-39.9, adult: Secondary | ICD-10-CM | POA: Insufficient documentation

## 2016-01-04 DIAGNOSIS — R7303 Prediabetes: Secondary | ICD-10-CM | POA: Insufficient documentation

## 2016-01-04 DIAGNOSIS — Z713 Dietary counseling and surveillance: Secondary | ICD-10-CM | POA: Insufficient documentation

## 2016-01-04 NOTE — Patient Instructions (Signed)
   Track your food intake using a food diary.   Continue to aim for eating every 4 hours during the day.   Include exercise as often as possible with regards to your knee pain.   Keep drinking plenty of water -- aim for 64 oz or more of fluids daily.

## 2016-01-04 NOTE — Progress Notes (Signed)
Medical Nutrition Therapy: Visit start time: 0940  end time: 1020  Assessment:  Diagnosis: obesity Medical history changes: no changes  Psychosocial issues/ stress concerns: none  Current weight: 213.3lbs  Height: 5'2" Medications, supplement changes: started Vitamin D weekly supplement  Progress and evaluation: Weight loss of 2.8lbs in about 1 month. Patient reports some "chaos" in her life in the past several weeks, making diet changes more difficult. She is trying to eat on a more regular basis, but continues to have little hunger, somewhat due to taking over the counter appetite control supplement. She states that she does crave sweets sometimes. She has been working to increase water intake.    Physical activity: general activity daily; less swimming due to recent epidural, and knee pain (climing steps into and out of pool)  Dietary Intake:  Usual eating pattern includes 2-3 meals and ? snacks per day. Dining out frequency: not assessed today  Breakfast: yogurt and toast or fruit Snack: none Lunch: none, snacking on grapes throughout the day, clementines, apples Snack: rarely -- occasionally granola bar; craves sweets.  Supper: lean meat, often chicken, occasionally all- beef hot dogs, pasta or rice or potato, and 1-2 veg like broccoli, green beans, corn Snack: none Beverages: at least 32oz water daily + tea with Equal sweetener  Nutrition Care Education: Topics covered: weight management Basic nutrition: appropriate nutrient balance    Weight control: appropriate calorie level for weight loss and importance of controlling calories more than any single nutrient; reviewed options for small meals to promote eating at regular intervals; importance of regular physical activity as tolerated; benefits of tracking food intake.     Nutritional Diagnosis:  [place]-3.3 Overweight/obesity As related to history of excess calories.  As evidenced by patient report.  Intervention: Discussion as  noted above.    Commended patient for progress made.    Updated goals with patient input.   Education Materials given:  Marland Kitchen Goals/ instructions  Learner/ who was taught:  . Patient   Level of understanding: Marland Kitchen Verbalizes/ demonstrates competency  Demonstrated degree of understanding via:   Teach back Learning barriers: . None  Willingness to learn/ readiness for change: . Eager, change in progress  Monitoring and Evaluation:  Dietary intake, exercise, and body weight      follow up: 03/14/16

## 2016-01-06 ENCOUNTER — Other Ambulatory Visit: Payer: Self-pay | Admitting: Family Medicine

## 2016-01-06 DIAGNOSIS — M1711 Unilateral primary osteoarthritis, right knee: Secondary | ICD-10-CM | POA: Diagnosis not present

## 2016-01-06 DIAGNOSIS — G8929 Other chronic pain: Secondary | ICD-10-CM | POA: Diagnosis not present

## 2016-01-06 DIAGNOSIS — M25561 Pain in right knee: Secondary | ICD-10-CM | POA: Diagnosis not present

## 2016-01-06 DIAGNOSIS — F4310 Post-traumatic stress disorder, unspecified: Secondary | ICD-10-CM | POA: Diagnosis not present

## 2016-01-07 ENCOUNTER — Other Ambulatory Visit: Payer: Self-pay | Admitting: Student

## 2016-01-07 ENCOUNTER — Telehealth: Payer: Self-pay | Admitting: Family Medicine

## 2016-01-07 DIAGNOSIS — M25561 Pain in right knee: Secondary | ICD-10-CM

## 2016-01-07 DIAGNOSIS — G8929 Other chronic pain: Secondary | ICD-10-CM

## 2016-01-07 DIAGNOSIS — M1711 Unilateral primary osteoarthritis, right knee: Secondary | ICD-10-CM

## 2016-01-08 NOTE — Telephone Encounter (Signed)
[employer] Approval Authorization [id number] from 01/08/2016 through 07/06/16. Office was notified of approval.

## 2016-01-13 DIAGNOSIS — F4310 Post-traumatic stress disorder, unspecified: Secondary | ICD-10-CM | POA: Diagnosis not present

## 2016-01-14 DIAGNOSIS — G4733 Obstructive sleep apnea (adult) (pediatric): Secondary | ICD-10-CM | POA: Diagnosis not present

## 2016-01-19 ENCOUNTER — Telehealth: Payer: Self-pay | Admitting: Family Medicine

## 2016-01-19 ENCOUNTER — Ambulatory Visit (INDEPENDENT_AMBULATORY_CARE_PROVIDER_SITE_OTHER): Payer: Commercial Managed Care - HMO | Admitting: General Surgery

## 2016-01-19 ENCOUNTER — Ambulatory Visit
Admission: RE | Admit: 2016-01-19 | Discharge: 2016-01-19 | Disposition: A | Discharge status: Home / Self Care | Payer: Commercial Managed Care - HMO | Source: Ambulatory Visit | Attending: Student | Admitting: Student

## 2016-01-19 ENCOUNTER — Encounter: Payer: Self-pay | Admitting: General Surgery

## 2016-01-19 VITALS — BP 128/72 | HR 82 | Resp 14 | Ht 62.0 in | Wt 218.0 lb

## 2016-01-19 DIAGNOSIS — M25561 Pain in right knee: Secondary | ICD-10-CM | POA: Diagnosis not present

## 2016-01-19 DIAGNOSIS — S83221A Peripheral tear of medial meniscus, current injury, right knee, initial encounter: Secondary | ICD-10-CM | POA: Insufficient documentation

## 2016-01-19 DIAGNOSIS — G8929 Other chronic pain: Secondary | ICD-10-CM | POA: Insufficient documentation

## 2016-01-19 DIAGNOSIS — M1711 Unilateral primary osteoarthritis, right knee: Secondary | ICD-10-CM | POA: Insufficient documentation

## 2016-01-19 DIAGNOSIS — R234 Changes in skin texture: Secondary | ICD-10-CM

## 2016-01-19 DIAGNOSIS — M7121 Synovial cyst of popliteal space [Baker], right knee: Secondary | ICD-10-CM | POA: Insufficient documentation

## 2016-01-19 DIAGNOSIS — K432 Incisional hernia without obstruction or gangrene: Secondary | ICD-10-CM | POA: Diagnosis not present

## 2016-01-19 DIAGNOSIS — L918 Other hypertrophic disorders of the skin: Secondary | ICD-10-CM

## 2016-01-19 DIAGNOSIS — M948X6 Other specified disorders of cartilage, lower leg: Secondary | ICD-10-CM | POA: Diagnosis not present

## 2016-01-19 NOTE — Assessment & Plan Note (Signed)
Refer to derm  ?

## 2016-01-19 NOTE — Telephone Encounter (Signed)
My apologies; I did not enter those referrals That is completely on me I called patient and personally apologized Will ask derm to handle both issues

## 2016-01-19 NOTE — Progress Notes (Signed)
Patient ID: Susan [name], female   DOB: [birth year], 55 y.o.   MRN: [id number]  Chief Complaint  Patient presents with  . Hernia    HPI Susan [name] is a 55 y.o. female. here today for a evaluation of a recurrent ventral hernia. She reports pain that comes and goes. Patient had a ct scan done on 10/09/15. Ventral hernia repair was done in 06/2011, and 11/2012. She also reports that she has many lumps across her abdomen that she is concerned about. She reports that at times her whole upper abdominal area will get very hard.  Bowels are regular. She is having an MRI on her right knee today, she is on Vicodin as needed for pain. She had lost down to 213 but her sons moved back in with her and feels she is stress eating. She is still smoking but changed to Ultra Lite's. I have reviewed the history of present illness with the patient.   HPI  Past Medical History:  Diagnosis Date  . Anxiety   . Arthritis    rheumatoid arthritis  . Asthma   . Depression    bipolar, hx of suicide attempt  . H/O suicide attempt    slit wrists  . History of alcohol abuse   . History of diverticulitis 2013  . History of hepatitis C   . History of MRSA infection 2013  . Hypertension   . Hypothyroidism   . 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 Multinodular thyroid   . OSA (obstructive sleep apnea)    managed by Dr. Manuella Ghazi  . Osteoporosis   . Post-traumatic osteoarthritis of right knee 09/24/2015  . Sleep apnea    Pt discontinue use of CPAP last year, Dx 3 years ago.   . Thyroid disease   . Vitamin B12 deficiency   . Vitamin D deficiency disease     Past Surgical History:  Procedure Laterality Date  . BILATERAL SALPINGOOPHORECTOMY     due to abnormal mass  . BREAST SURGERY Left 20 yrs ago  . CESAREAN SECTION    . HERNIA REPAIR  06/2011, July 2014   Ventral wall repair with Physiomesh    Family History  Problem Relation Age of Onset  . Alcohol abuse Father   . Arthritis Mother   . Asthma Mother   .  Mental illness Mother   . Thyroid disease Mother   . COPD Mother   . Heart disease Mother   . Congestive Heart Failure Mother   . Arthritis Brother   . Mental illness Brother   . Cancer Brother     non-hodkins lymphoma  . Alcohol abuse Sister   . Drug abuse Sister   . Mental illness Sister   . Diabetes Neg Hx   . Stroke Neg Hx     Social History Social History  Substance Use Topics  . Smoking status: Current Every Day Smoker    Packs/day: 0.50    Years: 30.00    Types: Cigarettes  . Smokeless tobacco: Never Used  . Alcohol use No    Allergies  Allergen Reactions  . Hydrochlorothiazide Other (See Comments)    Electrolyte imbalance  . Lasix [Furosemide]     Electrolyte imbalance    Current Outpatient Prescriptions  Medication Sig Dispense Refill  . albuterol (VENTOLIN HFA) 108 (90 BASE) MCG/ACT inhaler Inhale 2 puffs into the lungs every 4 (four) hours as needed for wheezing or shortness of breath. 18 g 2  . amLODipine (NORVASC) 10 MG  tablet TAKE 1 TABLET BY MOUTH DAILY. 30 tablet 11  . atenolol (TENORMIN) 25 MG tablet TAKE 1 TABLET BY MOUTH DAILY. 30 tablet 11  . busPIRone (BUSPAR) 15 MG tablet TAKE 1 TABLET BY MOUTH IN THE MORNING & TAKE 1 & 1/2 TABLET EACH EVENING 75 tablet 6  . clonazePAM (KLONOPIN) 0.5 MG tablet Take 0.5 mg by mouth 3 (three) times daily as needed for anxiety.    . Cyanocobalamin (RA VITAMIN B-12 TR) 1000 MCG TBCR Take by mouth.    . cyclobenzaprine (FLEXERIL) 5 MG tablet Take 5 mg by mouth daily as needed.     . DULoxetine (CYMBALTA) 60 MG capsule Take 120 mg by mouth daily.     Water engineer Bandages & Supports (ACE KNEE BRACE W/STABILIZERS) MISC Knee brace with stabilizers, RIGHT knee; suspect internal derangement 1 each 0  . gabapentin (NEURONTIN) 300 MG capsule Take 600 mg by mouth 2 (two) times daily.     Marland Kitchen HYDROcodone-acetaminophen (NORCO/VICODIN) 5-325 MG tablet 1 po bid prn Earliest Fill Date: 01/04/16    . losartan (COZAAR) 100 MG tablet TAKE 1  TABLET BY MOUTH DAILY. 30 tablet 4  . meloxicam (MOBIC) 15 MG tablet Take 1 tablet (15 mg total) by mouth daily. 30 tablet 0  . Misc. Devices (CANE) MISC Right knee instability; use for stability 1 each 0  . omeprazole (PRILOSEC) 20 MG capsule TAKE 1 CAPSULE BY MOUTH DAILY PRN; TRY TO SKIP 1 OR 2 DAYS EACH WEEK. 26 capsule 1  . traMADol (ULTRAM) 50 MG tablet Take 100 mg by mouth every 6 (six) hours as needed.     . Vitamin D, Ergocalciferol, (DRISDOL) 50000 units CAPS capsule Take 1 capsule (50,000 Units total) by mouth every 7 (seven) days. 4 capsule 1   No current facility-administered medications for this visit.     Review of Systems Review of Systems  Constitutional: Negative.   Respiratory: Negative.   Cardiovascular: Negative.     Blood pressure 128/72, pulse 82, resp. rate 14, height 5\' 2"  (1.575 m), weight 218 lb (98.9 kg).  Physical Exam Physical Exam  Constitutional: She is oriented to person, place, and time. She appears well-developed and well-nourished.  HENT:  Mouth/Throat: Oropharynx is clear and moist.  Eyes: Conjunctivae are normal. No scleral icterus.  Neck: Neck supple.  Cardiovascular: Normal rate, regular rhythm and normal heart sounds.   Pulmonary/Chest: Effort normal and breath sounds normal.  Abdominal: Soft. Normal appearance and bowel sounds are normal. A hernia ( epigastric and infraumbilical hernias are not easily palpable.) is present.  Lymphadenopathy:    She has no cervical adenopathy.  Neurological: She is alert and oriented to person, place, and time.  Skin: Skin is warm and dry.  Psychiatric: Her behavior is normal.    Data Reviewed Progress ntoes  Assessment    Recurrent ventral hernia. Pt has multiple risk factors-weight, smoking, diabetes.    Plan    Discussed fully with pt.  If she quits smoking and makes a honest attempt to lose some weight (under 200 lb) will discuss surgical repair. Pt is agreeable to this.     Follow up in 2-3  months. Advised to call if she has any new symptoms with the hernia  This information has been scribed by Karie Fetch RN, BSN,BC.   Martin Belling G 01/19/2016, 9:43 AM

## 2016-01-19 NOTE — Patient Instructions (Addendum)
The patient is aware to call back for any questions or concerns. Hernia, Adult A hernia is the bulging of an organ or tissue through a weak spot in the muscles of the abdomen (abdominal wall). Hernias develop most often near the navel or groin. There are many kinds of hernias. Common kinds include:  Femoral hernia. This kind of hernia develops under the groin in the upper thigh area.  Inguinal hernia. This kind of hernia develops in the groin or scrotum.  Umbilical hernia. This kind of hernia develops near the navel.  Hiatal hernia. This kind of hernia causes part of the stomach to be pushed up into the chest.  Incisional hernia. This kind of hernia bulges through a scar from an abdominal surgery. CAUSES This condition may be caused by:  Heavy lifting.  Coughing over a long period of time.  Straining to have a bowel movement.  An incision made during an abdominal surgery.  A birth defect (congenital defect).  Excess weight or obesity.  Smoking.  Poor nutrition.  Cystic fibrosis.  Excess fluid in the abdomen.  Undescended testicles. SYMPTOMS Symptoms of a hernia include:  A lump on the abdomen. This is the first sign of a hernia. The lump may become more obvious with standing, straining, or coughing. It may get bigger over time if it is not treated or if the condition causing it is not treated.  Pain. A hernia is usually painless, but it may become painful over time if treatment is delayed. The pain is usually dull and may get worse with standing or lifting heavy objects. Sometimes a hernia gets tightly squeezed in the weak spot (strangulated) or stuck there (incarcerated) and causes additional symptoms. These symptoms may include:  Vomiting.  Nausea.  Constipation.  Irritability. DIAGNOSIS A hernia may be diagnosed with:  A physical exam. During the exam your health care provider may ask you to cough or to make a specific movement, because a hernia is usually  more visible when you move.  Imaging tests. These can include:  X-rays.  Ultrasound.  CT scan. TREATMENT A hernia that is small and painless may not need to be treated. A hernia that is large or painful may be treated with surgery. Inguinal hernias may be treated with surgery to prevent incarceration or strangulation. Strangulated hernias are always treated with surgery, because lack of blood to the trapped organ or tissue can cause it to die. Surgery to treat a hernia involves pushing the bulge back into place and repairing the weak part of the abdomen. HOME CARE INSTRUCTIONS  Avoid straining.  Do not lift anything heavier than 10 lb (4.5 kg).  Lift with your leg muscles, not your back muscles. This helps avoid strain.  When coughing, try to cough gently.  Prevent constipation. Constipation leads to straining with bowel movements, which can make a hernia worse or cause a hernia repair to break down. You can prevent constipation by:  Eating a high-fiber diet that includes plenty of fruits and vegetables.  Drinking enough fluids to keep your urine clear or pale yellow. Aim to drink 6-8 glasses of water per day.  Using a stool softener as directed by your health care provider.  Lose weight, if you are overweight.  Do not use any tobacco products, including cigarettes, chewing tobacco, or electronic cigarettes. If you need help quitting, ask your health care provider.  Keep all follow-up visits as directed by your health care provider. This is important. Your health care provider may   need to monitor your condition. SEEK MEDICAL CARE IF:  You have swelling, redness, and pain in the affected area.  Your bowel habits change. SEEK IMMEDIATE MEDICAL CARE IF:  You have a fever.  You have abdominal pain that is getting worse.  You feel nauseous or you vomit.  You cannot push the hernia back in place by gently pressing on it while you are lying down.  The hernia:  Changes in  shape or size.  Is stuck outside the abdomen.  Becomes discolored.  Feels hard or tender.   This information is not intended to replace advice given to you by your health care provider. Make sure you discuss any questions you have with your health care provider.   Document Released: 04/25/2005 Document Revised: 05/16/2014 Document Reviewed: 03/05/2014 Elsevier Interactive Patient Education 2016 Elsevier Inc.  

## 2016-01-20 DIAGNOSIS — F4310 Post-traumatic stress disorder, unspecified: Secondary | ICD-10-CM | POA: Diagnosis not present

## 2016-01-22 ENCOUNTER — Ambulatory Visit: Payer: Commercial Managed Care - HMO | Admitting: Family Medicine

## 2016-01-29 ENCOUNTER — Telehealth: Payer: Self-pay | Admitting: Family Medicine

## 2016-01-29 NOTE — Telephone Encounter (Signed)
PT SAID THAT [facility] WILL NOT TAKE HER INSURACNE FOR THIS REFERRAL. AND  WANTS TO KNOW IF YOU COULD SCHEDULE HER ONE WITH [facility] ON [address] PHONE # IS [phone] IF THEY WILL TAKE HER INSURANCE.

## 2016-01-29 NOTE — Telephone Encounter (Signed)
Routed to [facility]

## 2016-02-01 DIAGNOSIS — L304 Erythema intertrigo: Secondary | ICD-10-CM | POA: Diagnosis not present

## 2016-02-01 DIAGNOSIS — L820 Inflamed seborrheic keratosis: Secondary | ICD-10-CM | POA: Diagnosis not present

## 2016-02-01 DIAGNOSIS — L858 Other specified epidermal thickening: Secondary | ICD-10-CM | POA: Diagnosis not present

## 2016-02-02 DIAGNOSIS — M4806 Spinal stenosis, lumbar region: Secondary | ICD-10-CM | POA: Diagnosis not present

## 2016-02-02 DIAGNOSIS — M5136 Other intervertebral disc degeneration, lumbar region: Secondary | ICD-10-CM | POA: Diagnosis not present

## 2016-02-02 DIAGNOSIS — M5416 Radiculopathy, lumbar region: Secondary | ICD-10-CM | POA: Diagnosis not present

## 2016-02-08 ENCOUNTER — Other Ambulatory Visit: Payer: Self-pay | Admitting: Family Medicine

## 2016-02-08 NOTE — Telephone Encounter (Signed)
Decrease med frequency, with cautions; continue to try to wean

## 2016-02-09 DIAGNOSIS — M1711 Unilateral primary osteoarthritis, right knee: Secondary | ICD-10-CM | POA: Diagnosis not present

## 2016-02-09 DIAGNOSIS — M25561 Pain in right knee: Secondary | ICD-10-CM | POA: Diagnosis not present

## 2016-02-09 DIAGNOSIS — G8929 Other chronic pain: Secondary | ICD-10-CM | POA: Diagnosis not present

## 2016-02-17 DIAGNOSIS — M25561 Pain in right knee: Secondary | ICD-10-CM | POA: Diagnosis not present

## 2016-02-17 DIAGNOSIS — M1711 Unilateral primary osteoarthritis, right knee: Secondary | ICD-10-CM | POA: Diagnosis not present

## 2016-02-17 DIAGNOSIS — G8929 Other chronic pain: Secondary | ICD-10-CM | POA: Diagnosis not present

## 2016-02-24 DIAGNOSIS — G8929 Other chronic pain: Secondary | ICD-10-CM | POA: Diagnosis not present

## 2016-02-24 DIAGNOSIS — M25561 Pain in right knee: Secondary | ICD-10-CM | POA: Diagnosis not present

## 2016-02-24 DIAGNOSIS — M1711 Unilateral primary osteoarthritis, right knee: Secondary | ICD-10-CM | POA: Diagnosis not present

## 2016-03-02 DIAGNOSIS — F4310 Post-traumatic stress disorder, unspecified: Secondary | ICD-10-CM | POA: Diagnosis not present

## 2016-03-04 ENCOUNTER — Other Ambulatory Visit: Payer: Self-pay | Admitting: Family Medicine

## 2016-03-06 MED ORDER — BUSPIRONE HCL 15 MG PO TABS: ORAL_TABLET | ORAL | 5 refills | Status: DC

## 2016-03-06 MED ORDER — FAMOTIDINE 20 MG PO TABS: 20.0000 mg | ORAL_TABLET | Freq: Two times a day (BID) | ORAL | 5 refills | Status: DC

## 2016-03-06 NOTE — Telephone Encounter (Signed)
Weaning off PPI; add H2 blocker

## 2016-03-08 DIAGNOSIS — M25561 Pain in right knee: Secondary | ICD-10-CM | POA: Diagnosis not present

## 2016-03-08 DIAGNOSIS — M1711 Unilateral primary osteoarthritis, right knee: Secondary | ICD-10-CM | POA: Diagnosis not present

## 2016-03-08 DIAGNOSIS — G8929 Other chronic pain: Secondary | ICD-10-CM | POA: Diagnosis not present

## 2016-03-14 ENCOUNTER — Ambulatory Visit: Payer: Medicare Other | Admitting: Dietician

## 2016-03-16 DIAGNOSIS — F4310 Post-traumatic stress disorder, unspecified: Secondary | ICD-10-CM | POA: Diagnosis not present

## 2016-03-18 DIAGNOSIS — M1711 Unilateral primary osteoarthritis, right knee: Secondary | ICD-10-CM | POA: Diagnosis not present

## 2016-03-21 ENCOUNTER — Encounter: Payer: Self-pay | Admitting: Dietician

## 2016-03-21 NOTE — Progress Notes (Signed)
Patient cancelled her appointment for 03/14/16, stating she did not need to return at this time. Sent discharge letter to MD.

## 2016-03-22 DIAGNOSIS — E042 Nontoxic multinodular goiter: Secondary | ICD-10-CM | POA: Diagnosis not present

## 2016-03-23 DIAGNOSIS — F4310 Post-traumatic stress disorder, unspecified: Secondary | ICD-10-CM | POA: Diagnosis not present

## 2016-03-25 DIAGNOSIS — Z23 Encounter for immunization: Secondary | ICD-10-CM | POA: Diagnosis not present

## 2016-03-29 DIAGNOSIS — E042 Nontoxic multinodular goiter: Secondary | ICD-10-CM | POA: Diagnosis not present

## 2016-04-06 DIAGNOSIS — F4310 Post-traumatic stress disorder, unspecified: Secondary | ICD-10-CM | POA: Diagnosis not present

## 2016-04-08 ENCOUNTER — Other Ambulatory Visit: Payer: Self-pay | Admitting: Family Medicine

## 2016-04-08 NOTE — Telephone Encounter (Signed)
appt 04/12/16 Will get labs then Last Cr reviewed; Rx approved

## 2016-04-12 ENCOUNTER — Encounter: Payer: Self-pay | Admitting: Family Medicine

## 2016-04-12 ENCOUNTER — Ambulatory Visit (INDEPENDENT_AMBULATORY_CARE_PROVIDER_SITE_OTHER): Payer: Medicare Other | Admitting: Family Medicine

## 2016-04-12 DIAGNOSIS — F1021 Alcohol dependence, in remission: Secondary | ICD-10-CM

## 2016-04-12 DIAGNOSIS — R0789 Other chest pain: Secondary | ICD-10-CM | POA: Diagnosis not present

## 2016-04-12 DIAGNOSIS — I10 Essential (primary) hypertension: Secondary | ICD-10-CM | POA: Diagnosis not present

## 2016-04-12 DIAGNOSIS — E538 Deficiency of other specified B group vitamins: Secondary | ICD-10-CM | POA: Diagnosis not present

## 2016-04-12 DIAGNOSIS — E0590 Thyrotoxicosis, unspecified without thyrotoxic crisis or storm: Secondary | ICD-10-CM | POA: Diagnosis not present

## 2016-04-12 DIAGNOSIS — F1020 Alcohol dependence, uncomplicated: Secondary | ICD-10-CM | POA: Diagnosis not present

## 2016-04-12 DIAGNOSIS — M25561 Pain in right knee: Secondary | ICD-10-CM

## 2016-04-12 DIAGNOSIS — G8929 Other chronic pain: Secondary | ICD-10-CM | POA: Diagnosis not present

## 2016-04-12 DIAGNOSIS — E0520 Thyrotoxicosis with toxic multinodular goiter without thyrotoxic crisis or storm: Secondary | ICD-10-CM

## 2016-04-12 DIAGNOSIS — R7301 Impaired fasting glucose: Secondary | ICD-10-CM

## 2016-04-12 DIAGNOSIS — R079 Chest pain, unspecified: Secondary | ICD-10-CM | POA: Insufficient documentation

## 2016-04-12 DIAGNOSIS — E559 Vitamin D deficiency, unspecified: Secondary | ICD-10-CM | POA: Diagnosis not present

## 2016-04-12 LAB — CBC WITH DIFFERENTIAL/PLATELET
Basophils Absolute: 0 cells/uL (ref 0–200)
Basophils Relative: 0 %
Eosinophils Absolute: 258 cells/uL (ref 15–500)
Eosinophils Relative: 3 %
HCT: 41.7 % (ref 35.0–45.0)
Hemoglobin: 14.1 g/dL (ref 11.7–15.5)
Lymphocytes Relative: 40 %
Lymphs Abs: 3440 cells/uL (ref 850–3900)
MCH: 30.9 pg (ref 27.0–33.0)
MCHC: 33.8 g/dL (ref 32.0–36.0)
MCV: 91.2 fL (ref 80.0–100.0)
MPV: 8.6 fL (ref 7.5–12.5)
Monocytes Absolute: 602 cells/uL (ref 200–950)
Monocytes Relative: 7 %
Neutro Abs: 4300 cells/uL (ref 1500–7800)
Neutrophils Relative %: 50 %
Platelets: 220 10*3/uL (ref 140–400)
RBC: 4.57 MIL/uL (ref 3.80–5.10)
RDW: 12.9 % (ref 11.0–15.0)
WBC: 8.6 10*3/uL (ref 3.8–10.8)

## 2016-04-12 LAB — LIPID PANEL
Cholesterol: 172 mg/dL (ref <200)
HDL: 43 mg/dL — ABNORMAL LOW (ref >50)
LDL Cholesterol: 103 mg/dL — ABNORMAL HIGH (ref <100)
Total CHOL/HDL Ratio: 4 Ratio (ref <5.0)
Triglycerides: 129 mg/dL (ref <150)
VLDL: 26 mg/dL (ref <30)

## 2016-04-12 LAB — COMPLETE METABOLIC PANEL WITH GFR
ALT: 20 U/L (ref 6–29)
AST: 19 U/L (ref 10–35)
Albumin: 4 g/dL (ref 3.6–5.1)
Alkaline Phosphatase: 80 U/L (ref 33–130)
BUN: 8 mg/dL (ref 7–25)
CO2: 28 mmol/L (ref 20–31)
Calcium: 9.1 mg/dL (ref 8.6–10.4)
Chloride: 105 mmol/L (ref 98–110)
Creat: 0.81 mg/dL (ref 0.50–1.05)
GFR, Est African American: >89 mL/min (ref >=60)
GFR, Est Non African American: 82 mL/min (ref >=60)
Glucose, Bld: 85 mg/dL (ref 65–99)
Potassium: 4.3 mmol/L (ref 3.5–5.3)
Sodium: 141 mmol/L (ref 135–146)
Total Bilirubin: 0.3 mg/dL (ref 0.2–1.2)
Total Protein: 6.6 g/dL (ref 6.1–8.1)

## 2016-04-12 LAB — HEMOGLOBIN A1C
Hgb A1c MFr Bld: 5.3 % (ref <5.7)
Mean Plasma Glucose: 105 mg/dL

## 2016-04-12 NOTE — Assessment & Plan Note (Signed)
Well-controlled; continue medicine; try to follow the DASH guidelines

## 2016-04-12 NOTE — Assessment & Plan Note (Signed)
Taking 1000 mcg daily  

## 2016-04-12 NOTE — Assessment & Plan Note (Signed)
Planning on total knee replacement in January

## 2016-04-12 NOTE — Assessment & Plan Note (Addendum)
She says she has been checked out by cardiologist, but will get EKG and have her call heart doctor; he said she was "fine"; EKG read by me today; unchanged from prior; patient was advised to call cardiologist from here to see if he wants to see her today or get additional testing; copy of EKG to be faxed to his office

## 2016-04-12 NOTE — Assessment & Plan Note (Signed)
Noted; will not plan to prescribe benzos or opiates

## 2016-04-12 NOTE — Assessment & Plan Note (Signed)
Turned loose by endo unless any trouble swallowing

## 2016-04-12 NOTE — Assessment & Plan Note (Signed)
Taking 1000 iu vitamin D, but will check level today to see if more needed

## 2016-04-12 NOTE — Assessment & Plan Note (Signed)
Work on weight loss; craving sweets, dry skin, blurry vision concerning for elevated sugars; check glucose and A1c today (fasting)

## 2016-04-12 NOTE — Patient Instructions (Signed)
Let's get labs today Call Dr. Saralyn Pilar now about your symptoms and see if he wants to see you, particularly for clearance before your surgery

## 2016-04-12 NOTE — Progress Notes (Signed)
BP 128/70 (BP Location: Left Arm, Patient Position: Sitting, Cuff Size: Normal)   Pulse 79   Temp 97.9 F (36.6 C)   Resp 16   Ht 5\' 2"  (1.575 m)   Wt 215 lb 8 oz (97.8 kg)   SpO2 97%   BMI 39.42 kg/m    Subjective:    Patient ID: Susan [name], female    DOB: 09-03-1960, 55 y.o.   MRN: [id number]  HPI: Susan [name] is a 55 y.o. female  Chief Complaint  Patient presents with  . Follow-up    Fasting labs    She has thyroid dysfunction; sees endocrinologist for that; last TSH was 0.39 (low) on December 17, 2015; she saw her endocrinologist since the last visit; no more ultrasounds and no more biopsies and she can turn me loose to PCP, and to call her if trouble swallowing; energy is not good; kind of tired all the time, but works through it  Vitamin D deficiency, 16 (low) on December 17, 2015; has 1000 iu vitamin D at home  High cholesterol; LDL was 119 on October 09, 2015; not many saturated fats  CMP normal on October 09, 2015  Prediabetes; A1c was 6.1 on October 09, 2015; blurred vision; sometimes has to have sugar; dentist and dermatologist asked if she is diabetic; in 10 months, major bone loss in her mouth; dermatologist said her skin is dry  Hypertension; she is on a CCB, ARB, and beta-blocker  Obesity; working on weight loss; after knee replacement, she is getting the weight off  Tobacco abuse; smoking 1/2 ppd, down from full flavored 100 to ultra light short; only smoking outside  Having trouble with soreness in the back of the neck; sore for over a month;   Having total knee replacement on May 10, 2016  At the end of the visit, she says she had left sided chest pain last night and thought she was going to die in her sleep; no chest pain now; went away after an hour or so; thinks it was anxiety, ran across some of her dead husband's things; she has a cardiologist, and has had a heart work-up; Dr. Saralyn Pilar; he said she was fine and it was stress and was a panic  attack  Depression screen [facility] 2/9 04/12/2016 12/03/2015 10/09/2015 09/24/2015 09/01/2015  Decreased Interest 0 0 0 3 0  Down, Depressed, Hopeless 0 1 1 1 1   PHQ - 2 Score 0 1 1 4 1   Altered sleeping - - - 3 -  Tired, decreased energy - - - 3 -  Change in appetite - - - 3 -  Feeling bad or failure about yourself  - - - 1 -  Trouble concentrating - - - 3 -  Moving slowly or fidgety/restless - - - 3 -  Suicidal thoughts - - - 0 -  PHQ-9 Score - - - 20 -  Difficult doing work/chores - - - Somewhat difficult -   Relevant past medical, surgical, family and social history reviewed Past Medical History:  Diagnosis Date  . Anxiety   . Arthritis    rheumatoid arthritis  . Asthma   . Depression    bipolar, hx of suicide attempt  . H/O suicide attempt    slit wrists  . History of alcohol abuse   . History of diverticulitis 2013  . History of hepatitis C   . History of MRSA infection 2013  . Hypertension   . Hypothyroidism   .  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 Multinodular thyroid   . OSA (obstructive sleep apnea)    managed by Dr. Manuella Ghazi  . Osteoporosis   . Post-traumatic osteoarthritis of right knee 09/24/2015  . Sleep apnea    Pt discontinue use of CPAP last year, Dx 3 years ago.   . Thyroid disease   . Vitamin B12 deficiency   . Vitamin D deficiency disease    Past Surgical History:  Procedure Laterality Date  . BILATERAL SALPINGOOPHORECTOMY     due to abnormal mass  . BREAST SURGERY Left 20 yrs ago  . CESAREAN SECTION    . HERNIA REPAIR  06/2011, July 2014   Ventral wall repair with Physiomesh   Family History  Problem Relation Age of Onset  . Alcohol abuse Father   . Arthritis Mother   . Asthma Mother   . Mental illness Mother   . Thyroid disease Mother   . COPD Mother   . Heart disease Mother   . Congestive Heart Failure Mother   . Arthritis Brother   . Mental illness Brother   . Cancer Brother     non-hodkins lymphoma  . Alcohol abuse Sister   . Drug abuse Sister   .  Mental illness Sister   . Diabetes Neg Hx   . Stroke Neg Hx    Social History  Substance Use Topics  . Smoking status: Current Every Day Smoker    Packs/day: 0.50    Years: 30.00    Types: Cigarettes  . Smokeless tobacco: Never Used  . Alcohol use No   Interim medical history since last visit reviewed. Allergies and medications reviewed  Review of Systems Per HPI unless specifically indicated above     Objective:    BP 128/70 (BP Location: Left Arm, Patient Position: Sitting, Cuff Size: Normal)   Pulse 79   Temp 97.9 F (36.6 C)   Resp 16   Ht 5\' 2"  (1.575 m)   Wt 215 lb 8 oz (97.8 kg)   SpO2 97%   BMI 39.42 kg/m   Wt Readings from Last 3 Encounters:  04/12/16 215 lb 8 oz (97.8 kg)  01/19/16 218 lb (98.9 kg)  01/04/16 213 lb 4.8 oz (96.8 kg)    Physical Exam  Constitutional: She appears well-developed and well-nourished. No distress.  HENT:  Head: Normocephalic and atraumatic.  Eyes: EOM are normal. No scleral icterus.  Neck: No thyromegaly present.  Cardiovascular: Normal rate, regular rhythm and normal heart sounds.   No murmur heard. Pulmonary/Chest: Effort normal and breath sounds normal. No respiratory distress. She has no wheezes. She exhibits no tenderness and no bony tenderness.  Abdominal: Soft. Bowel sounds are normal. She exhibits no distension. There is no tenderness (epigastrium tender). There is no guarding.  Musculoskeletal: Normal range of motion. She exhibits no edema.  Neurological: She is alert.  Skin: Skin is warm and dry. She is not diaphoretic. No pallor.  Psychiatric: She has a normal mood and affect. Her behavior is normal. Judgment and thought content normal. Her mood appears not anxious. She does not exhibit a depressed mood.    Results for orders placed or performed in visit on 12/17/15  VITAMIN D 25 Hydroxy (Vit-D Deficiency, Fractures)  Result Value Ref Range   Vit D, 25-Hydroxy 16 (L) 30 - 100 ng/mL  T3, free  Result Value Ref  Range   T3, Free 2.7 2.3 - 4.2 pg/mL  T4, free  Result Value Ref Range   Free  T4 1.1 0.8 - 1.8 ng/dL  TSH  Result Value Ref Range   TSH 0.39 (L) mIU/L      Assessment & Plan:   Problem List Items Addressed This Visit      Cardiovascular and Mediastinum   Hypertension    Well-controlled; continue medicine; try to follow the DASH guidelines        Endocrine   Toxic multinodular goiter    Turned loose by endo unless any trouble swallowing      Subclinical hyperthyroidism    Checked by endo in November; I will follow this from now on per patient/endo request      Impaired fasting glucose    Work on weight loss; craving sweets, dry skin, blurry vision concerning for elevated sugars; check glucose and A1c today (fasting)      Relevant Orders   Hemoglobin A1c     Other   Vitamin D deficiency    Taking 1000 iu vitamin D, but will check level today to see if more needed      Relevant Orders   VITAMIN D 25 Hydroxy (Vit-D Deficiency, Fractures)   Vitamin B12 deficiency    Taking 1000 mcg daily      Pain in right knee    Planning on total knee replacement in January      Chest pain    She says she has been checked out by cardiologist, but will get EKG and have her call heart doctor; he said she was "fine"; EKG read by me today; unchanged from prior; patient was advised to call cardiologist from here to see if he wants to see her today or get additional testing; copy of EKG to be faxed to his office      Relevant Orders   COMPLETE METABOLIC PANEL WITH GFR   Lipid panel   CBC with Differential/Platelet   EKG 12-Lead   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  An after-visit summary was printed and given to the patient at [place].  Please see the patient instructions which may contain other information and recommendations beyond what is mentioned  above in the assessment and plan.  Meds ordered this encounter  Medications  . Multiple Vitamin (MULTIVITAMIN) tablet    Sig: Take 1 tablet by mouth daily.  Marland Kitchen econazole nitrate 1 % cream    Sig: Apply topically.    Orders Placed This Encounter  Procedures  . COMPLETE METABOLIC PANEL WITH GFR  . Hemoglobin A1c  . Lipid panel  . VITAMIN D 25 Hydroxy (Vit-D Deficiency, Fractures)  . CBC with Differential/Platelet  . EKG 12-Lead

## 2016-04-12 NOTE — Assessment & Plan Note (Signed)
Checked by endo in November; I will follow this from now on per patient/endo request

## 2016-04-13 DIAGNOSIS — F4310 Post-traumatic stress disorder, unspecified: Secondary | ICD-10-CM | POA: Diagnosis not present

## 2016-04-13 LAB — VITAMIN D 25 HYDROXY (VIT D DEFICIENCY, FRACTURES): Vit D, 25-Hydroxy: 16 ng/mL — ABNORMAL LOW (ref 30–100)

## 2016-04-20 ENCOUNTER — Ambulatory Visit: Payer: Commercial Managed Care - HMO | Admitting: General Surgery

## 2016-04-20 DIAGNOSIS — F4310 Post-traumatic stress disorder, unspecified: Secondary | ICD-10-CM | POA: Diagnosis not present

## 2016-04-21 DIAGNOSIS — Z720 Tobacco use: Secondary | ICD-10-CM | POA: Diagnosis not present

## 2016-04-21 DIAGNOSIS — R079 Chest pain, unspecified: Secondary | ICD-10-CM | POA: Diagnosis not present

## 2016-04-21 DIAGNOSIS — Z01810 Encounter for preprocedural cardiovascular examination: Secondary | ICD-10-CM | POA: Diagnosis not present

## 2016-04-21 DIAGNOSIS — G4733 Obstructive sleep apnea (adult) (pediatric): Secondary | ICD-10-CM | POA: Diagnosis not present

## 2016-04-21 DIAGNOSIS — F17200 Nicotine dependence, unspecified, uncomplicated: Secondary | ICD-10-CM | POA: Insufficient documentation

## 2016-04-21 DIAGNOSIS — I10 Essential (primary) hypertension: Secondary | ICD-10-CM | POA: Diagnosis not present

## 2016-04-25 DIAGNOSIS — Z01810 Encounter for preprocedural cardiovascular examination: Secondary | ICD-10-CM | POA: Diagnosis not present

## 2016-04-25 DIAGNOSIS — R079 Chest pain, unspecified: Secondary | ICD-10-CM | POA: Diagnosis not present

## 2016-04-27 ENCOUNTER — Encounter
Admission: RE | Admit: 2016-04-27 | Discharge: 2016-04-27 | Disposition: A | Discharge status: Home / Self Care | Payer: Medicare Other | Source: Ambulatory Visit | Attending: Surgery | Admitting: Surgery

## 2016-04-27 ENCOUNTER — Ambulatory Visit
Admission: RE | Admit: 2016-04-27 | Discharge: 2016-04-27 | Disposition: A | Discharge status: Home / Self Care | Payer: Medicare Other | Source: Ambulatory Visit | Attending: Surgery | Admitting: Surgery

## 2016-04-27 DIAGNOSIS — Z01812 Encounter for preprocedural laboratory examination: Secondary | ICD-10-CM | POA: Insufficient documentation

## 2016-04-27 DIAGNOSIS — F4310 Post-traumatic stress disorder, unspecified: Secondary | ICD-10-CM | POA: Diagnosis not present

## 2016-04-27 DIAGNOSIS — Z01810 Encounter for preprocedural cardiovascular examination: Secondary | ICD-10-CM | POA: Diagnosis not present

## 2016-04-27 DIAGNOSIS — Z01818 Encounter for other preprocedural examination: Secondary | ICD-10-CM | POA: Diagnosis not present

## 2016-04-27 DIAGNOSIS — I10 Essential (primary) hypertension: Secondary | ICD-10-CM

## 2016-04-27 DIAGNOSIS — F411 Generalized anxiety disorder: Secondary | ICD-10-CM | POA: Diagnosis not present

## 2016-04-27 HISTORY — DX: Bipolar disorder, unspecified: F31.9

## 2016-04-27 HISTORY — DX: Gastro-esophageal reflux disease without esophagitis: K21.9

## 2016-04-27 HISTORY — DX: Dyspnea, unspecified: R06.00

## 2016-04-27 LAB — URINALYSIS, COMPLETE (UACMP) WITH MICROSCOPIC
Bilirubin Urine: NEGATIVE
Glucose, UA: NEGATIVE mg/dL
Hgb urine dipstick: NEGATIVE
Ketones, ur: NEGATIVE mg/dL
Nitrite: NEGATIVE
Protein, ur: NEGATIVE mg/dL
Specific Gravity, Urine: 1.004 — ABNORMAL LOW (ref 1.005–1.030)
pH: 6 (ref 5.0–8.0)

## 2016-04-27 LAB — CBC
HCT: 42.8 % (ref 35.0–47.0)
Hemoglobin: 14.7 g/dL (ref 12.0–16.0)
MCH: 31.2 pg (ref 26.0–34.0)
MCHC: 34.3 g/dL (ref 32.0–36.0)
MCV: 90.9 fL (ref 80.0–100.0)
Platelets: 250 10*3/uL (ref 150–440)
RBC: 4.71 MIL/uL (ref 3.80–5.20)
RDW: 12.6 % (ref 11.5–14.5)
WBC: 10.1 10*3/uL (ref 3.6–11.0)

## 2016-04-27 LAB — BASIC METABOLIC PANEL
Anion gap: 8 (ref 5–15)
BUN: 14 mg/dL (ref 6–20)
CO2: 27 mmol/L (ref 22–32)
Calcium: 9.6 mg/dL (ref 8.9–10.3)
Chloride: 104 mmol/L (ref 101–111)
Creatinine, Ser: 0.84 mg/dL (ref 0.44–1.00)
GFR calc Af Amer: >60 mL/min (ref >60)
GFR calc non Af Amer: >60 mL/min (ref >60)
Glucose, Bld: 98 mg/dL (ref 65–99)
Potassium: 4.2 mmol/L (ref 3.5–5.1)
Sodium: 139 mmol/L (ref 135–145)

## 2016-04-27 LAB — PROTIME-INR
INR: 0.94
Prothrombin Time: 12.6 seconds (ref 11.4–15.2)

## 2016-04-27 LAB — SURGICAL PCR SCREEN
MRSA, PCR: NEGATIVE
Staphylococcus aureus: NEGATIVE

## 2016-04-27 LAB — TYPE AND SCREEN
ABO/RH(D): B POS
Antibody Screen: NEGATIVE

## 2016-04-27 NOTE — Patient Instructions (Signed)
  Your procedure is scheduled on: May 10, 2016 (Tuesday) Report to Same Day Surgery 2nd floor medical mall [facility] Entrance-take elevator on left to 2nd floor.  Check in with surgery information desk.) To find out your arrival time please call [phone] between 1PM - 3PM on May 06, 2016 (Friday)  Remember: Instructions that are not followed completely may result in serious medical risk, up to and including death, or upon the discretion of your surgeon and anesthesiologist your surgery may need to be rescheduled.    _x___ 1. Do not eat food or drink liquids after midnight. No gum chewing or hard candies.     __x__ 2. No Alcohol for 24 hours before or after surgery.   __x__3. No Smoking for 24 prior to surgery.   ____  4. Bring all medications with you on the day of surgery if instructed.    __x__ 5. Notify your doctor if there is any change in your medical condition     (cold, fever, infections).     Do not wear jewelry, make-up, hairpins, clips or nail polish.  Do not wear lotions, powders, or perfumes. You may wear deodorant.  Do not shave 48 hours prior to surgery. Men may shave face and neck.  Do not bring valuables to the hospital.    [facility] is not responsible for any belongings or valuables.               Contacts, dentures or bridgework may not be worn into surgery.  Leave your suitcase in the car. After surgery it may be brought to your room.  For patients admitted to the hospital, discharge time is determined by your treatment team.   Patients discharged the day of surgery will not be allowed to drive home.  You will need someone to drive you home and stay with you the night of your procedure.    Please read over the following fact sheets that you were given:   [facility] Preparing for Surgery and or MRSA Information   _x___ Take these medicines the morning of surgery with A SIP OF WATER:    1. Amlodipine  2. Gabapentin  3. Losartan  4.  Cymbalta  5. Buspar  6.  ____Fleets enema or Magnesium Citrate as directed.   _x___ Use CHG Soap or sage wipes as directed on instruction sheet   _x___ Use inhalers on the day of surgery and bring to hospital day of surgery (USE ALBUTEROL [place])  ____ Stop metformin 2 days prior to surgery    ____ Take 1/2 of usual insulin dose the night before surgery and none on the morning of           surgery.   _x___ Stop Aspirin, Coumadin, Pllavix ,Eliquis, Effient, or Pradaxa (NO ASPIRIN)  x__ Stop Anti-inflammatories such as Advil, Aleve, Ibuprofen, Motrin, Naproxen,          Naprosyn, Goodies powders or aspirin products. Ok to take Tylenol.   _x___ Stop supplements until after surgery. (STOP VITAMIN B-12 NOW)  _x_ Bring C-Pap to the hospital.

## 2016-04-29 DIAGNOSIS — Z720 Tobacco use: Secondary | ICD-10-CM | POA: Diagnosis not present

## 2016-04-29 DIAGNOSIS — R079 Chest pain, unspecified: Secondary | ICD-10-CM | POA: Diagnosis not present

## 2016-04-29 DIAGNOSIS — Z01810 Encounter for preprocedural cardiovascular examination: Secondary | ICD-10-CM | POA: Diagnosis not present

## 2016-04-29 DIAGNOSIS — I10 Essential (primary) hypertension: Secondary | ICD-10-CM | POA: Diagnosis not present

## 2016-05-05 ENCOUNTER — Encounter: Payer: Self-pay | Admitting: *Deleted

## 2016-05-05 ENCOUNTER — Other Ambulatory Visit: Payer: Self-pay | Admitting: Family Medicine

## 2016-05-10 ENCOUNTER — Inpatient Hospital Stay: Payer: Medicare Other | Admitting: Certified Registered Nurse Anesthetist

## 2016-05-10 ENCOUNTER — Inpatient Hospital Stay
Admission: RE | Admit: 2016-05-10 | Discharge: 2016-05-11 | DRG: 470 | Disposition: A | Discharge status: Home Health | Payer: Medicare Other | Source: Ambulatory Visit | Attending: Surgery | Admitting: Surgery

## 2016-05-10 ENCOUNTER — Encounter: Payer: Self-pay | Admitting: *Deleted

## 2016-05-10 ENCOUNTER — Inpatient Hospital Stay: Payer: Medicare Other

## 2016-05-10 ENCOUNTER — Encounter
Admission: RE | Disposition: A | Discharge status: Home Health | Payer: Self-pay | Source: Ambulatory Visit | Attending: Surgery

## 2016-05-10 DIAGNOSIS — J45909 Unspecified asthma, uncomplicated: Secondary | ICD-10-CM | POA: Diagnosis not present

## 2016-05-10 DIAGNOSIS — E039 Hypothyroidism, unspecified: Secondary | ICD-10-CM | POA: Diagnosis present

## 2016-05-10 DIAGNOSIS — M069 Rheumatoid arthritis, unspecified: Secondary | ICD-10-CM | POA: Diagnosis present

## 2016-05-10 DIAGNOSIS — F319 Bipolar disorder, unspecified: Secondary | ICD-10-CM | POA: Diagnosis present

## 2016-05-10 DIAGNOSIS — Z96651 Presence of right artificial knee joint: Secondary | ICD-10-CM | POA: Diagnosis not present

## 2016-05-10 DIAGNOSIS — I10 Essential (primary) hypertension: Secondary | ICD-10-CM | POA: Diagnosis not present

## 2016-05-10 DIAGNOSIS — M810 Age-related osteoporosis without current pathological fracture: Secondary | ICD-10-CM | POA: Diagnosis present

## 2016-05-10 DIAGNOSIS — Z22322 Carrier or suspected carrier of Methicillin resistant Staphylococcus aureus: Secondary | ICD-10-CM

## 2016-05-10 DIAGNOSIS — B1920 Unspecified viral hepatitis C without hepatic coma: Secondary | ICD-10-CM | POA: Diagnosis present

## 2016-05-10 DIAGNOSIS — G4733 Obstructive sleep apnea (adult) (pediatric): Secondary | ICD-10-CM | POA: Diagnosis present

## 2016-05-10 DIAGNOSIS — G8929 Other chronic pain: Secondary | ICD-10-CM | POA: Diagnosis present

## 2016-05-10 DIAGNOSIS — K219 Gastro-esophageal reflux disease without esophagitis: Secondary | ICD-10-CM | POA: Diagnosis present

## 2016-05-10 DIAGNOSIS — M6281 Muscle weakness (generalized): Secondary | ICD-10-CM

## 2016-05-10 DIAGNOSIS — F419 Anxiety disorder, unspecified: Secondary | ICD-10-CM | POA: Diagnosis present

## 2016-05-10 DIAGNOSIS — F418 Other specified anxiety disorders: Secondary | ICD-10-CM | POA: Diagnosis not present

## 2016-05-10 DIAGNOSIS — M25561 Pain in right knee: Secondary | ICD-10-CM

## 2016-05-10 DIAGNOSIS — R262 Difficulty in walking, not elsewhere classified: Secondary | ICD-10-CM

## 2016-05-10 DIAGNOSIS — M1711 Unilateral primary osteoarthritis, right knee: Secondary | ICD-10-CM | POA: Diagnosis not present

## 2016-05-10 DIAGNOSIS — Z471 Aftercare following joint replacement surgery: Secondary | ICD-10-CM | POA: Diagnosis not present

## 2016-05-10 HISTORY — DX: Presence of right artificial knee joint: Z96.651

## 2016-05-10 HISTORY — PX: TOTAL KNEE ARTHROPLASTY: SHX125

## 2016-05-10 LAB — URINE DRUG SCREEN, QUALITATIVE (ARMC ONLY)
Amphetamines, Ur Screen: NOT DETECTED
Barbiturates, Ur Screen: NOT DETECTED
Benzodiazepine, Ur Scrn: NOT DETECTED
Cannabinoid 50 Ng, Ur ~~LOC~~: NOT DETECTED
Cocaine Metabolite,Ur ~~LOC~~: NOT DETECTED
MDMA (Ecstasy)Ur Screen: NOT DETECTED
Methadone Scn, Ur: NOT DETECTED
Opiate, Ur Screen: POSITIVE — AB
Phencyclidine (PCP) Ur S: NOT DETECTED
Tricyclic, Ur Screen: POSITIVE — AB

## 2016-05-10 LAB — ABO/RH: ABO/RH(D): B POS

## 2016-05-10 SURGERY — ARTHROPLASTY, KNEE, TOTAL
Anesthesia: Spinal | Site: Knee | Laterality: Right | Wound class: Clean

## 2016-05-10 MED ORDER — ACETAMINOPHEN 10 MG/ML IV SOLN
INTRAVENOUS | Status: DC | PRN
  Administered 2016-05-10: 1000 mg via INTRAVENOUS

## 2016-05-10 MED ORDER — MAGNESIUM HYDROXIDE 400 MG/5ML PO SUSP: 30.0000 mL | Freq: Every day | ORAL | Status: DC | PRN

## 2016-05-10 MED ORDER — ACETAMINOPHEN 650 MG RE SUPP: 650.0000 mg | Freq: Four times a day (QID) | RECTAL | Status: DC | PRN

## 2016-05-10 MED ORDER — DOCUSATE SODIUM 100 MG PO CAPS
100.0000 mg | ORAL_CAPSULE | Freq: Two times a day (BID) | ORAL | Status: DC
  Administered 2016-05-10 – 2016-05-11 (×3): 100 mg via ORAL
  Filled 2016-05-10 (×3): qty 1

## 2016-05-10 MED ORDER — ALBUTEROL SULFATE HFA 108 (90 BASE) MCG/ACT IN AERS: 2.0000 | INHALATION_SPRAY | RESPIRATORY_TRACT | Status: DC | PRN

## 2016-05-10 MED ORDER — PROPOFOL 10 MG/ML IV BOLUS
INTRAVENOUS | Status: AC
  Filled 2016-05-10: qty 20

## 2016-05-10 MED ORDER — ENOXAPARIN SODIUM 40 MG/0.4ML ~~LOC~~ SOLN
40.0000 mg | SUBCUTANEOUS | Status: DC
  Administered 2016-05-11: 40 mg via SUBCUTANEOUS
  Filled 2016-05-10: qty 0.4

## 2016-05-10 MED ORDER — TRANEXAMIC ACID 1000 MG/10ML IV SOLN
INTRAVENOUS | Status: DC | PRN
  Administered 2016-05-10: 1000 mg via INTRAVENOUS

## 2016-05-10 MED ORDER — AMLODIPINE BESYLATE 10 MG PO TABS
10.0000 mg | ORAL_TABLET | Freq: Every day | ORAL | Status: DC
  Administered 2016-05-11: 10 mg via ORAL
  Filled 2016-05-10: qty 1

## 2016-05-10 MED ORDER — DULOXETINE HCL 60 MG PO CPEP
60.0000 mg | ORAL_CAPSULE | Freq: Two times a day (BID) | ORAL | Status: DC
  Administered 2016-05-10 – 2016-05-11 (×2): 60 mg via ORAL
  Filled 2016-05-10 (×2): qty 1

## 2016-05-10 MED ORDER — LOSARTAN POTASSIUM 50 MG PO TABS
100.0000 mg | ORAL_TABLET | Freq: Every day | ORAL | Status: DC
  Administered 2016-05-11: 100 mg via ORAL
  Filled 2016-05-10: qty 2

## 2016-05-10 MED ORDER — ALBUTEROL SULFATE (2.5 MG/3ML) 0.083% IN NEBU: 2.5000 mg | INHALATION_SOLUTION | RESPIRATORY_TRACT | Status: DC | PRN

## 2016-05-10 MED ORDER — BUPIVACAINE HCL (PF) 0.5 % IJ SOLN
INTRAMUSCULAR | Status: AC
  Filled 2016-05-10: qty 30

## 2016-05-10 MED ORDER — GABAPENTIN 300 MG PO CAPS
600.0000 mg | ORAL_CAPSULE | Freq: Two times a day (BID) | ORAL | Status: DC
  Administered 2016-05-10 – 2016-05-11 (×2): 600 mg via ORAL
  Filled 2016-05-10 (×2): qty 2

## 2016-05-10 MED ORDER — GLYCOPYRROLATE 0.2 MG/ML IJ SOLN
INTRAMUSCULAR | Status: AC
  Filled 2016-05-10: qty 1

## 2016-05-10 MED ORDER — ACETAMINOPHEN 500 MG PO TABS
1000.0000 mg | ORAL_TABLET | Freq: Four times a day (QID) | ORAL | Status: AC
  Administered 2016-05-10 – 2016-05-11 (×3): 1000 mg via ORAL
  Filled 2016-05-10 (×4): qty 2

## 2016-05-10 MED ORDER — METOCLOPRAMIDE HCL 5 MG/ML IJ SOLN
5.0000 mg | Freq: Three times a day (TID) | INTRAMUSCULAR | Status: DC | PRN
Start: 2016-05-10 — End: 2016-05-11

## 2016-05-10 MED ORDER — TRANEXAMIC ACID 1000 MG/10ML IV SOLN
INTRAVENOUS | Status: AC
  Filled 2016-05-10: qty 10

## 2016-05-10 MED ORDER — ADULT MULTIVITAMIN W/MINERALS CH
1.0000 | ORAL_TABLET | Freq: Every day | ORAL | Status: DC
  Administered 2016-05-10 – 2016-05-11 (×2): 1 via ORAL
  Filled 2016-05-10 (×2): qty 1

## 2016-05-10 MED ORDER — FLEET ENEMA 7-19 GM/118ML RE ENEM: 1.0000 | ENEMA | Freq: Once | RECTAL | Status: DC | PRN

## 2016-05-10 MED ORDER — BUSPIRONE HCL 5 MG PO TABS
22.5000 mg | ORAL_TABLET | Freq: Every evening | ORAL | Status: DC
  Administered 2016-05-10: 22.5 mg via ORAL
  Filled 2016-05-10 (×4): qty 2

## 2016-05-10 MED ORDER — OXYCODONE HCL 5 MG PO TABS
5.0000 mg | ORAL_TABLET | ORAL | Status: DC | PRN
  Administered 2016-05-10 – 2016-05-11 (×3): 10 mg via ORAL
  Filled 2016-05-10 (×3): qty 2

## 2016-05-10 MED ORDER — KETAMINE HCL 10 MG/ML IJ SOLN
INTRAMUSCULAR | Status: AC
  Filled 2016-05-10: qty 1

## 2016-05-10 MED ORDER — CLONAZEPAM 0.5 MG PO TABS: 0.5000 mg | ORAL_TABLET | Freq: Three times a day (TID) | ORAL | Status: DC | PRN

## 2016-05-10 MED ORDER — SODIUM CHLORIDE 0.9 % IV SOLN
INTRAVENOUS | Status: DC | PRN
  Administered 2016-05-10: 7.5 ug/kg/min via INTRAVENOUS

## 2016-05-10 MED ORDER — BUPIVACAINE HCL (PF) 0.5 % IJ SOLN
INTRAMUSCULAR | Status: DC | PRN
  Administered 2016-05-10: 3 mL

## 2016-05-10 MED ORDER — ATENOLOL 25 MG PO TABS
25.0000 mg | ORAL_TABLET | Freq: Every day | ORAL | Status: DC
  Administered 2016-05-10 – 2016-05-11 (×2): 25 mg via ORAL
  Filled 2016-05-10 (×2): qty 1

## 2016-05-10 MED ORDER — ONDANSETRON HCL 4 MG PO TABS: 4.0000 mg | ORAL_TABLET | Freq: Four times a day (QID) | ORAL | Status: DC | PRN

## 2016-05-10 MED ORDER — PROPOFOL 10 MG/ML IV BOLUS
INTRAVENOUS | Status: DC | PRN
  Administered 2016-05-10 (×3): 19 mg via INTRAVENOUS

## 2016-05-10 MED ORDER — ONDANSETRON HCL 4 MG/2ML IJ SOLN
4.0000 mg | Freq: Four times a day (QID) | INTRAMUSCULAR | Status: DC | PRN
  Administered 2016-05-10: 4 mg via INTRAVENOUS
  Filled 2016-05-10: qty 2

## 2016-05-10 MED ORDER — HYDROMORPHONE HCL 1 MG/ML IJ SOLN
1.0000 mg | INTRAMUSCULAR | Status: DC | PRN
  Administered 2016-05-10 – 2016-05-11 (×4): 2 mg via INTRAVENOUS
  Filled 2016-05-10 (×4): qty 2

## 2016-05-10 MED ORDER — CEFAZOLIN SODIUM-DEXTROSE 2-4 GM/100ML-% IV SOLN
INTRAVENOUS | Status: AC
  Filled 2016-05-10: qty 100

## 2016-05-10 MED ORDER — VITAMIN B-12 1000 MCG PO TABS
1000.0000 ug | ORAL_TABLET | Freq: Every day | ORAL | Status: DC
  Administered 2016-05-10 – 2016-05-11 (×2): 1000 ug via ORAL
  Filled 2016-05-10 (×2): qty 1

## 2016-05-10 MED ORDER — ECONAZOLE NITRATE 1 % EX CREA: 1.0000 "application " | TOPICAL_CREAM | Freq: Two times a day (BID) | CUTANEOUS | Status: DC | PRN

## 2016-05-10 MED ORDER — KETOROLAC TROMETHAMINE 15 MG/ML IJ SOLN
15.0000 mg | Freq: Four times a day (QID) | INTRAMUSCULAR | Status: DC
  Administered 2016-05-10 – 2016-05-11 (×3): 15 mg via INTRAVENOUS
  Filled 2016-05-10 (×3): qty 1

## 2016-05-10 MED ORDER — LIDOCAINE 2% (20 MG/ML) 5 ML SYRINGE
INTRAMUSCULAR | Status: AC
  Filled 2016-05-10: qty 5

## 2016-05-10 MED ORDER — PROPOFOL 500 MG/50ML IV EMUL
INTRAVENOUS | Status: DC | PRN
  Administered 2016-05-10: 75 ug/kg/min via INTRAVENOUS

## 2016-05-10 MED ORDER — KCL IN DEXTROSE-NACL 20-5-0.9 MEQ/L-%-% IV SOLN
INTRAVENOUS | Status: DC
  Administered 2016-05-10 (×2): via INTRAVENOUS
  Filled 2016-05-10 (×4): qty 1000

## 2016-05-10 MED ORDER — FENTANYL CITRATE (PF) 100 MCG/2ML IJ SOLN
INTRAMUSCULAR | Status: DC | PRN
  Administered 2016-05-10 (×2): 50 ug via INTRAVENOUS

## 2016-05-10 MED ORDER — FENTANYL CITRATE (PF) 100 MCG/2ML IJ SOLN
INTRAMUSCULAR | Status: AC
  Filled 2016-05-10: qty 2

## 2016-05-10 MED ORDER — CEFAZOLIN SODIUM-DEXTROSE 2-4 GM/100ML-% IV SOLN
2.0000 g | Freq: Once | INTRAVENOUS | Status: AC
Start: 2016-05-10 — End: 2016-05-10
  Administered 2016-05-10: 2 g via INTRAVENOUS

## 2016-05-10 MED ORDER — MIDAZOLAM HCL 5 MG/5ML IJ SOLN
INTRAMUSCULAR | Status: DC | PRN
  Administered 2016-05-10: 2 mg via INTRAVENOUS

## 2016-05-10 MED ORDER — NEOMYCIN-POLYMYXIN B GU 40-200000 IR SOLN
Status: AC
  Filled 2016-05-10: qty 20

## 2016-05-10 MED ORDER — BUPIVACAINE LIPOSOME 1.3 % IJ SUSP
INTRAMUSCULAR | Status: DC | PRN
  Administered 2016-05-10 (×2): 60 mL

## 2016-05-10 MED ORDER — ACETAMINOPHEN 10 MG/ML IV SOLN
INTRAVENOUS | Status: AC
  Filled 2016-05-10: qty 100

## 2016-05-10 MED ORDER — KETOROLAC TROMETHAMINE 15 MG/ML IJ SOLN
30.0000 mg | Freq: Once | INTRAMUSCULAR | Status: AC
  Administered 2016-05-10: 30 mg via INTRAVENOUS
  Filled 2016-05-10: qty 2

## 2016-05-10 MED ORDER — METOCLOPRAMIDE HCL 10 MG PO TABS
5.0000 mg | ORAL_TABLET | Freq: Three times a day (TID) | ORAL | Status: DC | PRN
Start: 2016-05-10 — End: 2016-05-11

## 2016-05-10 MED ORDER — LACTATED RINGERS IV SOLN
INTRAVENOUS | Status: DC
  Administered 2016-05-10: 07:00:00 via INTRAVENOUS

## 2016-05-10 MED ORDER — DIPHENHYDRAMINE HCL 12.5 MG/5ML PO ELIX: 12.5000 mg | ORAL_SOLUTION | ORAL | Status: DC | PRN

## 2016-05-10 MED ORDER — CEFAZOLIN SODIUM-DEXTROSE 2-4 GM/100ML-% IV SOLN
2.0000 g | Freq: Four times a day (QID) | INTRAVENOUS | Status: AC
  Administered 2016-05-10 – 2016-05-11 (×3): 2 g via INTRAVENOUS
  Filled 2016-05-10 (×3): qty 100

## 2016-05-10 MED ORDER — NEOMYCIN-POLYMYXIN B GU 40-200000 IR SOLN
Status: DC | PRN
  Administered 2016-05-10: 16 mL

## 2016-05-10 MED ORDER — KETAMINE HCL 50 MG/ML IJ SOLN
INTRAMUSCULAR | Status: DC | PRN
  Administered 2016-05-10 (×2): 1.9 mg via INTRAMUSCULAR

## 2016-05-10 MED ORDER — BISACODYL 10 MG RE SUPP: 10.0000 mg | Freq: Every day | RECTAL | Status: DC | PRN

## 2016-05-10 MED ORDER — ACETAMINOPHEN 325 MG PO TABS: 650.0000 mg | ORAL_TABLET | Freq: Four times a day (QID) | ORAL | Status: DC | PRN

## 2016-05-10 MED ORDER — SODIUM CHLORIDE 0.9 % IJ SOLN
INTRAMUSCULAR | Status: AC
  Filled 2016-05-10: qty 50

## 2016-05-10 MED ORDER — BUPIVACAINE-EPINEPHRINE (PF) 0.25% -1:200000 IJ SOLN
INTRAMUSCULAR | Status: AC
  Filled 2016-05-10: qty 30

## 2016-05-10 MED ORDER — BUSPIRONE HCL 5 MG PO TABS
15.0000 mg | ORAL_TABLET | Freq: Every morning | ORAL | Status: DC
  Administered 2016-05-11: 15 mg via ORAL
  Filled 2016-05-10: qty 3

## 2016-05-10 MED ORDER — MIDAZOLAM HCL 2 MG/2ML IJ SOLN
INTRAMUSCULAR | Status: AC
  Filled 2016-05-10: qty 2

## 2016-05-10 MED ORDER — BUPIVACAINE LIPOSOME 1.3 % IJ SUSP
INTRAMUSCULAR | Status: AC
  Filled 2016-05-10: qty 20

## 2016-05-10 MED ORDER — BUPIVACAINE-EPINEPHRINE 0.25% -1:200000 IJ SOLN
INTRAMUSCULAR | Status: DC | PRN
  Administered 2016-05-10 (×2): 30 mL

## 2016-05-10 SURGICAL SUPPLY — 59 items
BANDAGE ELASTIC 6 CLIP ST LF (GAUZE/BANDAGES/DRESSINGS) ×2 IMPLANT
BLADE SAW SAG 25X90X1.19 (BLADE) ×2 IMPLANT
BLADE SURG SZ20 CARB STEEL (BLADE) ×2 IMPLANT
BNDG COHESIVE 6X5 TAN STRL LF (GAUZE/BANDAGES/DRESSINGS) ×2 IMPLANT
BONE CEMENT PALACOSE (Orthopedic Implant) ×4 IMPLANT
CANISTER SUCT 1200ML W/VALVE (MISCELLANEOUS) ×2 IMPLANT
CANISTER SUCT 3000ML (MISCELLANEOUS) ×2 IMPLANT
CAPT KNEE TOTAL 3 ×2 IMPLANT
CATH TRAY METER 16FR LF (MISCELLANEOUS) ×2 IMPLANT
CEMENT BONE PALACOSE (Orthopedic Implant) ×2 IMPLANT
CHLORAPREP W/TINT 26ML (MISCELLANEOUS) ×2 IMPLANT
COOLER POLAR GLACIER W/PUMP (MISCELLANEOUS) ×2 IMPLANT
COVER MAYO STAND STRL (DRAPES) ×2 IMPLANT
CUFF TOURN 24 STER (MISCELLANEOUS) ×2 IMPLANT
CUFF TOURN 30 STER DUAL PORT (MISCELLANEOUS) IMPLANT
DECANTER SPIKE VIAL GLASS SM (MISCELLANEOUS) ×6 IMPLANT
DRAPE IMP U-DRAPE 54X76 (DRAPES) ×2 IMPLANT
DRAPE INCISE IOBAN 66X45 STRL (DRAPES) ×4 IMPLANT
DRAPE SHEET LG 3/4 BI-LAMINATE (DRAPES) ×2 IMPLANT
DRSG OPSITE POSTOP 4X10 (GAUZE/BANDAGES/DRESSINGS) ×2 IMPLANT
DRSG OPSITE POSTOP 4X12 (GAUZE/BANDAGES/DRESSINGS) IMPLANT
DRSG OPSITE POSTOP 4X14 (GAUZE/BANDAGES/DRESSINGS) IMPLANT
ELECT CAUTERY BLADE 6.4 (BLADE) ×2 IMPLANT
ELECT REM PT RETURN 9FT ADLT (ELECTROSURGICAL) ×2
ELECTRODE REM PT RTRN 9FT ADLT (ELECTROSURGICAL) ×1 IMPLANT
GLOVE BIO SURGEON STRL SZ7.5 (GLOVE) ×4 IMPLANT
GLOVE BIO SURGEON STRL SZ8 (GLOVE) ×4 IMPLANT
GLOVE BIOGEL PI IND STRL 8 (GLOVE) ×1 IMPLANT
GLOVE BIOGEL PI INDICATOR 8 (GLOVE) ×1
GLOVE INDICATOR 8.0 STRL GRN (GLOVE) ×2 IMPLANT
GOWN STRL REUS W/ TWL LRG LVL3 (GOWN DISPOSABLE) ×2 IMPLANT
GOWN STRL REUS W/ TWL XL LVL3 (GOWN DISPOSABLE) ×1 IMPLANT
GOWN STRL REUS W/TWL LRG LVL3 (GOWN DISPOSABLE) ×2
GOWN STRL REUS W/TWL XL LVL3 (GOWN DISPOSABLE) ×1
HANDPIECE INTERPULSE COAX TIP (DISPOSABLE) ×1
HOLDER FOLEY CATH W/STRAP (MISCELLANEOUS) ×2 IMPLANT
HOOD PEEL AWAY FLYTE STAYCOOL (MISCELLANEOUS) ×6 IMPLANT
IMMBOLIZER KNEE 19 BLUE UNIV (SOFTGOODS) ×2 IMPLANT
KIT RM TURNOVER STRD PROC AR (KITS) ×2 IMPLANT
NDL SAFETY 18GX1.5 (NEEDLE) ×2 IMPLANT
NEEDLE 18GX1X1/2 (RX/OR ONLY) (NEEDLE) ×2 IMPLANT
NEEDLE SPNL 20GX3.5 QUINCKE YW (NEEDLE) ×2 IMPLANT
NS IRRIG 1000ML POUR BTL (IV SOLUTION) ×2 IMPLANT
PACK TOTAL KNEE (MISCELLANEOUS) ×2 IMPLANT
PAD WRAPON POLAR KNEE (MISCELLANEOUS) ×1 IMPLANT
SET HNDPC FAN SPRY TIP SCT (DISPOSABLE) ×1 IMPLANT
SOL .9 NS 3000ML IRR  AL (IV SOLUTION) ×1
SOL .9 NS 3000ML IRR UROMATIC (IV SOLUTION) ×1 IMPLANT
STAPLER SKIN PROX 35W (STAPLE) ×2 IMPLANT
SUCTION FRAZIER HANDLE 10FR (MISCELLANEOUS) ×1
SUCTION TUBE FRAZIER 10FR DISP (MISCELLANEOUS) ×1 IMPLANT
SUT VIC AB 0 CT1 36 (SUTURE) ×6 IMPLANT
SUT VIC AB 2-0 CT1 27 (SUTURE) ×4
SUT VIC AB 2-0 CT1 TAPERPNT 27 (SUTURE) ×4 IMPLANT
SYR 20CC LL (SYRINGE) ×2 IMPLANT
SYR 30ML LL (SYRINGE) ×4 IMPLANT
SYRINGE 10CC LL (SYRINGE) ×2 IMPLANT
SYSTEM VACUUM CEMENT MIXING ×2 IMPLANT
WRAPON POLAR PAD KNEE (MISCELLANEOUS) ×2

## 2016-05-10 NOTE — Op Note (Signed)
05/10/2016  10:11 AM  Patient:   Susan [name]  Pre-Op Diagnosis:   Degenerative joint disease, right knee.  Post-Op Diagnosis:   Same  Procedure:   right TKA using all-cemented [employer] Vanguard system with a 67.5 mm mm PCR femur, a 71 mm tibial tray with a 14 mm AS E-poly insert, and a 34 x 8.5 mm all-poly 3-pegged domed patella.  Surgeon:   Pascal Lux, MD  Assistant:   Cameron Proud, PA-C   Anesthesia:   Spinal  Findings:   As above  Complications:   None  EBL:   50 cc  Fluids:   600 cc crystalloid  UOP:   225 cc  TT:   100 minutes at 300 mmHg  Drains:   None  Closure:   Staples  Implants:   As above  Brief Clinical Note:   The patient is a 56 year old female with a long history of progressively worsening right knee pain. The patient's symptoms have progressed despite medications, activity modification, injections, etc. The patient's history and examination were consistent with advanced degenerative joint disease of the right knee confirmed by plain radiographs. The patient presents at this time for a right total knee arthroplasty.  Procedure:   The patient was brought into the operating room. After adequate spinal anesthesia was obtained, the patient was lain in the supine position. A Foley catheter was placed by the nurse before the right lower extremity was prepped with ChloraPrep solution and draped sterilely. Preoperative antibiotics were administered. After verifying the proper laterality with a surgical timeout, the limb was exsanguinated with an Esmarch and the tourniquet inflated to 300 mmHg. A standard anterior approach to the knee was made through an approximately 7 inch incision. The incision was carried down through the subcutaneous tissues to expose superficial retinaculum. This was split the length of the incision and the medial flap elevated sufficiently to expose the medial retinaculum. The medial retinaculum was incised, leaving a 3-4 mm cuff of tissue  on the patella. This was extended distally along the medial border of the patellar tendon and proximally through the medial third of the quadriceps tendon. A subtotal fat pad excision was performed before the soft tissues were elevated off the anteromedial and anterolateral aspects of the proximal tibia to the level of the collateral ligaments. The anterior portions of the medial and lateral menisci were removed, as was the anterior cruciate ligament. With the knee flexed to 90, the external tibial guide was positioned and the appropriate proximal tibial cut made. This piece was taken to the back table where it was measured and found to be optimally replicated by a 71 mm component.  Attention was directed to the distal femur. The intramedullary canal was accessed through a 3/8" drill hole. The intramedullary guide was inserted and position in order to obtain a neutral flexion gap. The intercondylar block was positioned with care taken to avoid notching the anterior cortex of the femur. The appropriate cut was made. Next, the distal cutting block was placed at 6 of valgus alignment. Using the 9 mm slot, the distal cut was made. The distal femur was measured and found to be optimally replicated by the [id number] mm component. The 67.5 mm 4-in-1 cutting block was positioned and first the posterior, then the posterior chamfer, the anterior chamfer, femoral and intercondylar cuts were made. At this point, the posterior portions medial and lateral menisci were removed. A trial reduction was performed using the appropriate femoral and tibial components with the  10 mm, the 12 mm, and the 14 mm inserts. The 14 mm AS insert demonstrated excellent stability to varus and valgus stressing both in flexion and extension while permitting full extension. Patella tracking was assessed and found to be excellent. Therefore, the tibial guide position was marked on the proximal tibia. The patella thickness was measured and found to be 18  mm. Therefore, the appropriate cut was made. The surfaces were measured and found to be optimally replicated by the 34 mm component. The three peg holes were drilled in place before the trial button was inserted. Patella tracking was assessed and found to be excellent, passing the "no thumb test". The lug holes were drilled into the distal femur before the trial component was removed, leaving only the tibial tray. The keel was then created using the appropriate tower, reamer, and punch.  The bony surfaces were prepared for cementing by irrigating thoroughly with bacitracin saline solution. A bone plug was fashioned from some of the bone that had been removed previously and used to plug the distal femoral canal. In addition, 20 cc of Exparel diluted out to 60 cc with normal saline and 30 cc of 0.5% Sensorcaine were injected into the postero-medial and postero-lateral aspects of the knee, the medial and lateral gutter regions, and the peri-incisional tissues to help with postoperative analgesia. Meanwhile, the cement was being mixed on the back table. When it was ready, the tibial tray was cemented in first. The excess cement was removed using [employer] fast streamer. Next, the femoral component was impacted into place. Again, the excess cement was removed using [employer] fast streamer. The 14 mm AS trial insert was positioned and the knee brought into extension while the cement hardened. Finally, the patella was cemented into place and secured using the patellar clamp. Again, the excess cement was removed using [employer] fast streamer. Once the cement had hardened, the knee was placed through a range of motion with the findings as described above. Therefore, the trial insert was removed and, after verifying that no cement had been retained posteriorly, the permanent insert was positioned and secured using the appropriate key locking mechanism. Again the knee was placed through a range of motion with the findings as described above. This  time, will placing the knee through a range of motion, and a palpable click/snap was identified posterolaterally. Therefore, the insert was removed and the posterolateral capsule and popliteus tendon were released from the posrtolateral femur using a curved osteotome. The insert was re-positioned and secured using the appropriate new key locking mechanis and again placed through range of motion. The clicking/snapping had resolved.  The wound was copiously irrigated with bacitracin saline solution using the jet lavage system before the quadriceps tendon and retinacular layer were reapproximated using #0 Vicryl interrupted sutures. The superficial retinacular layer was closed using 2-0 Vicryl interrupted sutures in several layers for the skin was closed using staples. A sterile honeycomb dressing was applied to the skin before the leg was wrapped with an Ace wrap to accommodate the polar pack. The patient was then awakened and returned to the recovery room in satisfactory condition after tolerating the procedure well.

## 2016-05-10 NOTE — Evaluation (Addendum)
Physical Therapy Evaluation Patient Details Name: Susan [name] MRN: [id number] DOB: 04-09-1961 Today's Date: 05/10/2016   History of Present Illness  Pt admitted for R TKR.   Clinical Impression  Pt is a pleasant 56 year old female who was admitted for R TKR. Pt performs bed mobility, transfers, and ambulation with cga and RW. Pt demonstrates deficits with strength/mobility/endurance/pain. Pt able to perform 10 SLRs without assistance, does not require KI at this time. Would benefit from skilled PT to address above deficits and promote optimal return to PLOF. Recommend transition to [place] upon discharge from acute hospitalization.       Follow Up Recommendations Home health PT    Equipment Recommendations  Rolling walker with 5" wheels    Recommendations for Other Services       Precautions / Restrictions Precautions Precautions: Fall;Knee Precaution Booklet Issued: No Restrictions Weight Bearing Restrictions: Yes RLE Weight Bearing: Weight bearing as tolerated      Mobility  Bed Mobility Overal bed mobility: Needs Assistance Bed Mobility: Supine to Sit     Supine to sit: Min guard     General bed mobility comments: safe technique performed with ability to initiate sliding towards EOB. Once seated at EOB, able to sit with upright posture.  Transfers Overall transfer level: Needs assistance Equipment used: Rolling walker (2 wheeled) Transfers: Sit to/from Stand Sit to Stand: Min guard         General transfer comment: safe technique with cues for pushing from seated surface. Once standing, able to stand with upright posture. Heavy WB noted through B UE  Ambulation/Gait Ambulation/Gait assistance: Min guard Ambulation Distance (Feet): 3 Feet Assistive device: Rolling walker (2 wheeled) Gait Pattern/deviations: Step-to pattern     General Gait Details: ambulated using RW and slow step to gait pattern. Safe technique performed as pt responds well to verbal  cues.   Stairs            Wheelchair Mobility    Modified Rankin (Stroke Patients Only)       Balance Overall balance assessment: Needs assistance Sitting-balance support: Feet supported Sitting balance-Leahy Scale: Good     Standing balance support: Bilateral upper extremity supported Standing balance-Leahy Scale: Good                               Pertinent Vitals/Pain Pain Assessment: 0-10 Pain Score: 6  Pain Location: R knee Pain Descriptors / Indicators: Operative site guarding;Dull;Discomfort Pain Intervention(s): Limited activity within patient's tolerance;Repositioned;Ice applied    Home Living Family/patient expects to be discharged to:: Private residence Living Arrangements:  (has friend who will stay with her along with other family) Available Help at Discharge: Family Type of Home: House Home Access: Stairs to enter Entrance Stairs-Rails: Can reach both Entrance Stairs-Number of Steps: 5 Home Layout: Laundry or work area in [place]: Environmental consultant - 4 wheels      Prior Function Level of Independence: Independent               Hand Dominance        Extremity/Trunk Assessment   Upper Extremity Assessment Upper Extremity Assessment: Overall WFL for tasks assessed    Lower Extremity Assessment Lower Extremity Assessment: Generalized weakness (R LE grossly 3/5; L LE grossly 5/5)       Communication   Communication: No difficulties  Cognition Arousal/Alertness: Awake/alert Behavior During Therapy: WFL for tasks assessed/performed Overall Cognitive Status: Within Functional Limits for  tasks assessed                      General Comments      Exercises Total Joint Exercises Goniometric ROM: R knee AAROM: 18-90 degrees Other Exercises Other Exercises: supine ther-ex performed on R LE including ankle pumps, quad sets, SLRs, and hip abd/add. All ther-ex performed x 10 reps. Pt then performed seated knee  flexion x 5 reps.   Assessment/Plan    PT Assessment Patient needs continued PT services  PT Problem List Decreased strength;Decreased range of motion;Decreased activity tolerance;Decreased balance;Decreased mobility;Decreased knowledge of use of DME;Pain          PT Treatment Interventions DME instruction;Gait training;Therapeutic exercise    PT Goals (Current goals can be found in the Care Plan section)  Acute Rehab PT Goals Patient Stated Goal: to go home tomorrow PT Goal Formulation: With patient Time For Goal Achievement: 05/24/16 Potential to Achieve Goals: Good Additional Goals Additional Goal #1: Pt will be able to perform bed mobility/transfers with supervision and RW in order to improve functional mobility    Frequency BID   Barriers to discharge        Co-evaluation               End of Session Equipment Utilized During Treatment: Gait belt Activity Tolerance: Patient tolerated treatment well Patient left: in chair;with chair alarm set Nurse Communication: Mobility status;Weight bearing status         Time: [id number] PT Time Calculation (min) (ACUTE ONLY): 25 min   Charges:   PT Evaluation $PT Eval Moderate Complexity: 1 Procedure PT Treatments $Therapeutic Exercise: 8-22 mins   PT G Codes:        Syris Brookens 2016-05-29, 5:21 PM  Greggory Stallion, PT, DPT [phone]

## 2016-05-10 NOTE — Anesthesia Preprocedure Evaluation (Addendum)
Anesthesia Evaluation  Patient identified by MRN, date of birth, ID band Patient awake    Reviewed: Allergy & Precautions, H&P , NPO status , Patient's Chart, lab work & pertinent test results, reviewed documented beta blocker date and time   History of Anesthesia Complications Negative for: history of anesthetic complications  Airway Mallampati: III  TM Distance: >3 FB Neck ROM: full    Dental  (+) Poor Dentition, Missing, Partial Upper, Partial Lower, Dental Advidsory Given   Pulmonary neg shortness of breath, asthma , sleep apnea and Continuous Positive Airway Pressure Ventilation , neg recent URI, Current Smoker,    Pulmonary exam normal breath sounds clear to auscultation       Cardiovascular Exercise Tolerance: Good hypertension, (-) angina(-) CAD, (-) Past MI, (-) Cardiac Stents and (-) CABG Normal cardiovascular exam(-) dysrhythmias (-) Valvular Problems/Murmurs Rhythm:regular Rate:Normal     Neuro/Psych neg Seizures PSYCHIATRIC DISORDERS (Bipolar, h/o alcoholism)  Neuromuscular disease    GI/Hepatic GERD  ,(+) Hepatitis -, C  Endo/Other  neg diabetesHypothyroidism   Renal/GU negative Renal ROS  negative genitourinary   Musculoskeletal   Abdominal   Peds  Hematology negative hematology ROS (+)   Anesthesia Other Findings Past Medical History: No date: Anxiety No date: Arthritis     Comment: rheumatoid arthritis No date: Asthma No date: Bipolar disorder ([place]) No date: Depression     Comment: bipolar, hx of suicide attempt No date: Dyspnea     Comment: with exertion No date: GERD (gastroesophageal reflux disease) No date: H/O suicide attempt     Comment: slit wrists No date: History of alcohol abuse 2013: History of diverticulitis No date: History of hepatitis C     Comment: HEP "C"--three years ago 2013: History of MRSA infection No date: Hypertension No date: Hypothyroidism 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 Manuella Ghazi No date: Osteoporosis 09/24/2015: Post-traumatic osteoarthritis of right knee No date: Sleep apnea     Comment:  Dx 3 years ago. Use C-PAP No date: Thyroid disease     Comment: Goiter No date: Vitamin B12 deficiency No date: Vitamin D deficiency disease   Reproductive/Obstetrics negative OB ROS                             Anesthesia Physical Anesthesia Plan  ASA: III  Anesthesia Plan: Spinal   Post-op Pain Management:    Induction:   Airway Management Planned:   Additional Equipment:   Intra-op Plan:   Post-operative Plan:   Informed Consent: I have reviewed the patients History and Physical, chart, labs and discussed the procedure including the risks, benefits and alternatives for the proposed anesthesia with the patient or authorized representative who has indicated his/her understanding and acceptance.   Dental Advisory Given  Plan Discussed with: Anesthesiologist, CRNA and Surgeon  Anesthesia Plan Comments:         Anesthesia Quick Evaluation

## 2016-05-10 NOTE — Anesthesia Procedure Notes (Signed)
Spinal  Patient location during procedure: OR Start time: 05/10/2016 7:31 AM End time: 05/10/2016 7:45 AM Staffing Anesthesiologist: Martha Clan Performed: anesthesiologist  Preanesthetic Checklist Completed: patient identified, site marked, surgical consent, pre-op evaluation, timeout performed, IV checked, risks and benefits discussed and monitors and equipment checked Spinal Block Patient position: sitting Prep: ChloraPrep Patient monitoring: heart rate, continuous pulse ox, blood pressure and cardiac monitor Approach: midline Location: L4-5 Injection technique: single-shot Needle Needle type: [place]acre and Introducer  Needle gauge: 24 G Needle length: 9 cm Additional Notes Negative paresthesia. Negative blood return. Positive free-flowing CSF. Expiration date of kit checked and confirmed. Patient tolerated procedure well, without complications.

## 2016-05-10 NOTE — Transfer of Care (Signed)
Immediate Anesthesia Transfer of Care Note  Patient: Susan [name]  Procedure(s) Performed: Procedure(s): TOTAL KNEE ARTHROPLASTY (Right)  Patient Location: PACU  Anesthesia Type:Spinal  Level of Consciousness: awake and patient cooperative  Airway & Oxygen Therapy: Patient connected to face mask oxygen  Post-op Assessment: Report given to RN and Post -op Vital signs reviewed and stable  Post vital signs: Reviewed and stable  Last Vitals:  Vitals:   05/10/16 0613  BP: 126/87  Pulse: 77  Resp: 16  Temp: 36.1 C    Last Pain:  Vitals:   05/10/16 0613  TempSrc: Tympanic         Complications: No apparent anesthesia complications

## 2016-05-10 NOTE — H&P (Signed)
Paper H&P to be scanned into permanent record. H&P reviewed. No changes. 

## 2016-05-10 NOTE — Progress Notes (Signed)
CPM applied at this time

## 2016-05-10 NOTE — Progress Notes (Signed)
On admission patient alert and oriented. Able to move hips and feet. Patient states she is having some tingling sensation. No skin issues. Significant other at bedside. Patient tolerating liquids.   Deri Fuelling, RN

## 2016-05-11 ENCOUNTER — Encounter: Payer: Self-pay | Admitting: Surgery

## 2016-05-11 LAB — CBC WITH DIFFERENTIAL/PLATELET
Basophils Absolute: 0.1 10*3/uL (ref 0–0.1)
Basophils Relative: 1 %
Eosinophils Absolute: 0.2 10*3/uL (ref 0–0.7)
Eosinophils Relative: 2 %
HCT: 37.7 % (ref 35.0–47.0)
Hemoglobin: 13.1 g/dL (ref 12.0–16.0)
Lymphocytes Relative: 18 %
Lymphs Abs: 1.9 10*3/uL (ref 1.0–3.6)
MCH: 31.4 pg (ref 26.0–34.0)
MCHC: 34.8 g/dL (ref 32.0–36.0)
MCV: 90.1 fL (ref 80.0–100.0)
Monocytes Absolute: 0.8 10*3/uL (ref 0.2–0.9)
Monocytes Relative: 7 %
Neutro Abs: 7.9 10*3/uL — ABNORMAL HIGH (ref 1.4–6.5)
Neutrophils Relative %: 72 %
Platelets: 217 10*3/uL (ref 150–440)
RBC: 4.18 MIL/uL (ref 3.80–5.20)
RDW: 12.9 % (ref 11.5–14.5)
WBC: 10.9 10*3/uL (ref 3.6–11.0)

## 2016-05-11 LAB — BASIC METABOLIC PANEL
Anion gap: 6 (ref 5–15)
BUN: 9 mg/dL (ref 6–20)
CO2: 28 mmol/L (ref 22–32)
Calcium: 8.4 mg/dL — ABNORMAL LOW (ref 8.9–10.3)
Chloride: 104 mmol/L (ref 101–111)
Creatinine, Ser: 0.68 mg/dL (ref 0.44–1.00)
GFR calc Af Amer: >60 mL/min (ref >60)
GFR calc non Af Amer: >60 mL/min (ref >60)
Glucose, Bld: 133 mg/dL — ABNORMAL HIGH (ref 65–99)
Potassium: 4.1 mmol/L (ref 3.5–5.1)
Sodium: 138 mmol/L (ref 135–145)

## 2016-05-11 MED ORDER — OXYCODONE HCL 5 MG PO TABS: 5.0000 mg | ORAL_TABLET | ORAL | 0 refills | Status: DC | PRN

## 2016-05-11 MED ORDER — ENOXAPARIN SODIUM 40 MG/0.4ML ~~LOC~~ SOLN: 40.0000 mg | SUBCUTANEOUS | 0 refills | Status: DC

## 2016-05-11 NOTE — Discharge Instructions (Signed)

## 2016-05-11 NOTE — Anesthesia Postprocedure Evaluation (Signed)
Anesthesia Post Note  Patient: Susan [name]  Procedure(s) Performed: Procedure(s) (LRB): TOTAL KNEE ARTHROPLASTY (Right)  Patient location during evaluation: Other Anesthesia Type: Spinal Level of consciousness: awake and alert Pain management: pain level controlled Vital Signs Assessment: post-procedure vital signs reviewed and stable Respiratory status: spontaneous breathing and respiratory function stable Cardiovascular status: blood pressure returned to baseline and stable Postop Assessment: spinal receding Anesthetic complications: no     Last Vitals:  Vitals:   05/10/16 2351 05/11/16 0343  BP: (!) 101/55 (!) 125/57  Pulse: 74 74  Resp: 18 16  Temp: 36.4 C 36.7 C    Last Pain:  Vitals:   05/11/16 0410  TempSrc:   PainSc: Asleep                 Alison Stalling

## 2016-05-11 NOTE — Discharge Summary (Signed)
Physician Discharge Summary  Patient ID: Susan [name] MRN: [id number] DOB/AGE: 14-Mar-1961 56 y.o.  Admit date: 05/10/2016 Discharge date: 05/11/2016  Admission Diagnoses:  primary osteoarthritis right knee Right knee degenerative joint disease  Discharge Diagnoses: Patient Active Problem List   Diagnosis Date Noted  . 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 Chest pain 04/12/2016  . Cracked skin on feet 01/19/2016  . Diastasis recti 12/18/2015  . GERD (gastroesophageal reflux disease) 12/17/2015  . Inflamed skin tag 12/17/2015  . Abdominal pain 10/09/2015  . Chronic back pain 10/09/2015  . Post-traumatic osteoarthritis of right knee 09/24/2015  . Obesity 08/06/2015  . Pain in right knee 08/03/2015  . Postmenopausal 07/08/2015  . Preventative health care 07/08/2015  . Cervical cancer screening 07/08/2015  . Alcoholism in recovery ([place]) 04/13/2015  . Lymphocytosis 04/10/2015  . Impaired fasting glucose 04/10/2015  . Need for Streptococcus pneumoniae and influenza vaccination 04/09/2015  . Polyuria 04/09/2015  . Vitamin D deficiency 04/09/2015  . Vitamin B12 deficiency 04/09/2015  . Medication monitoring encounter 04/09/2015  . OSA on CPAP 04/09/2015  . Breast cancer screening 04/09/2015  . Subclinical hyperthyroidism 06/26/2014  . Toxic multinodular goiter 06/26/2014  . Hypertension 06/26/2014  . Hepatitis C 06/26/2014  . Tobacco dependence 06/26/2014  . Bipolar disorder ([place]) 06/26/2014  . Recurrent ventral hernia 11/08/2012  Right knee degenerative joint disease.  Past Medical History:  Diagnosis Date  . Anxiety   . Arthritis    rheumatoid arthritis  . Asthma   . Bipolar disorder ([place])   . Depression    bipolar, hx of suicide attempt  . Dyspnea    with exertion  . GERD (gastroesophageal reflux disease)   . H/O suicide attempt    slit wrists  . History of alcohol abuse   . History of diverticulitis 2013  . History of hepatitis  C    HEP "C"--three years ago  . History of MRSA infection 2013  . Hypertension   . Hypothyroidism   . 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 Multinodular thyroid   . OSA (obstructive sleep apnea)    managed by Dr. Manuella Ghazi  . Osteoporosis   . Post-traumatic osteoarthritis of right knee 09/24/2015  . Sleep apnea     Dx 3 years ago. Use C-PAP  . Thyroid disease    Goiter  . Vitamin B12 deficiency   . Vitamin D deficiency disease     Transfusion: None   Consultants (if any):   Discharged Condition: Improved  Hospital Course: Susan [name] is an 56 y.o. female who was admitted 05/10/2016 with a diagnosis of right knee degenerative joint disease and went to the operating room on 05/10/2016 and underwent the above named procedures.    Surgeries: Procedure(s): TOTAL KNEE ARTHROPLASTY on 05/10/2016 Patient tolerated the surgery well. Taken to PACU where she was stabilized and then transferred to the orthopedic floor.  Started on Lovenox 40mg  q 24 hrs. Foot pumps applied bilaterally at 80 mm. Heels elevated on bed with rolled towels. No evidence of DVT. Negative [place]. Physical therapy started on day #1 for gait training and transfer. OT started day #1 for ADL and assisted devices.  Patient's IV and Foley were d/c on POD1.  Implants: Right TKA using all-cemented [employer] Vanguard system with a 67.5 mm mm PCR femur, a 71 mm tibial tray with a 14 mm AS E-poly insert, and a 34 x 8.5 mm all-poly 3-pegged domed patella.  She was given perioperative antibiotics:  Anti-infectives    Start     Dose/Rate Route Frequency Ordered Stop   05/10/16 1400  ceFAZolin (ANCEF) IVPB 2g/100 mL premix     2 g 200 mL/hr over 30 Minutes Intravenous Every 6 hours 05/10/16 1105 05/11/16 0157   05/10/16 0559  ceFAZolin (ANCEF) 2-4 GM/100ML-% IVPB    Comments:  Hallaji, Violet Ann: cabinet override      05/10/16 0559 05/10/16 0751   05/10/16 0145  ceFAZolin (ANCEF) IVPB 2g/100 mL premix     2 g 200 mL/hr over 30 Minutes  Intravenous  Once 05/10/16 0138 05/10/16 0809    .  She was given sequential compression devices, early ambulation, and Lovenox for DVT prophylaxis.  She benefited maximally from the hospital stay and there were no complications.    Recent vital signs:  Vitals:   05/11/16 0343 05/11/16 0748  BP: (!) 125/57 (!) 132/58  Pulse: 74 81  Resp: 16   Temp: 98 F (36.7 C) 98.2 F (36.8 C)    Recent laboratory studies:  Lab Results  Component Value Date   HGB 13.1 05/11/2016   HGB 14.7 04/27/2016   HGB 14.1 04/12/2016   Lab Results  Component Value Date   WBC 10.9 05/11/2016   PLT 217 05/11/2016   Lab Results  Component Value Date   INR 0.94 04/27/2016   Lab Results  Component Value Date   NA 138 05/11/2016   K 4.1 05/11/2016   CL 104 05/11/2016   CO2 28 05/11/2016   BUN 9 05/11/2016   CREATININE 0.68 05/11/2016   GLUCOSE 133 (H) 05/11/2016    Discharge Medications:   Allergies as of 05/11/2016      Reactions   Hydrochlorothiazide Other (See Comments)   Electrolyte imbalance   Lasix [furosemide]    Electrolyte imbalance      Medication List    STOP taking these medications   HYDROcodone-acetaminophen 5-325 MG tablet Commonly known as:  NORCO/VICODIN     TAKE these medications   ACE KNEE BRACE W/STABILIZERS Misc Knee brace with stabilizers, RIGHT knee; suspect internal derangement   albuterol 108 (90 Base) MCG/ACT inhaler Commonly known as:  VENTOLIN HFA Inhale 2 puffs into the lungs every 4 (four) hours as needed for wheezing or shortness of breath.   amLODipine 10 MG tablet Commonly known as:  NORVASC TAKE 1 TABLET BY MOUTH DAILY.   atenolol 25 MG tablet Commonly known as:  TENORMIN TAKE 1 TABLET BY MOUTH DAILY. What changed:  See the new instructions.   busPIRone 15 MG tablet Commonly known as:  BUSPAR TAKE 1 TABLET BY MOUTH IN THE MORNING & TAKE 1 & 1/2 TABLET EACH EVENING What changed:  how much to take  how to take this  when to take  this  additional instructions   Cane Misc Right knee instability; use for stability   clonazePAM 0.5 MG tablet Commonly known as:  KLONOPIN Take 0.5 mg by mouth 3 (three) times daily as needed for anxiety.   cyclobenzaprine 5 MG tablet Commonly known as:  FLEXERIL Take 10 mg by mouth at bedtime.   DULoxetine 60 MG capsule Commonly known as:  CYMBALTA Take 60 mg by mouth 2 (two) times daily.   econazole nitrate 1 % cream Apply 1 application topically 2 (two) times daily as needed (for yeast or rash under breasts).   enoxaparin 40 MG/0.4ML injection Commonly known as:  LOVENOX Inject 0.4 mLs (40 mg total) into the skin daily.   famotidine 20  MG tablet Commonly known as:  PEPCID Take 1 tablet (20 mg total) by mouth 2 (two) times daily. For heartburn; wean off of omeprazole   gabapentin 300 MG capsule Commonly known as:  NEURONTIN Take 600 mg by mouth 2 (two) times daily.   losartan 100 MG tablet Commonly known as:  COZAAR TAKE 1 TABLET BY MOUTH DAILY.   meloxicam 15 MG tablet Commonly known as:  MOBIC Take 1 tablet (15 mg total) by mouth daily.   multivitamin tablet Take 1 tablet by mouth daily.   oxyCODONE 5 MG immediate release tablet Commonly known as:  Oxy IR/ROXICODONE Take 1-2 tablets (5-10 mg total) by mouth every 4 (four) hours as needed for breakthrough pain.   RA VITAMIN B-12 TR 1000 MCG Tbcr Generic drug:  Cyanocobalamin Take 1,000 mcg by mouth daily.   traMADol 50 MG tablet Commonly known as:  ULTRAM Take 100 mg by mouth 2 (two) times daily.            Durable Medical Equipment        Start     Ordered   05/10/16 1106  DME Walker rolling  Once    Question:  Patient needs a walker to treat with the following condition  Answer:  Status post total right knee replacement using cement   05/10/16 1105   05/10/16 1106  DME 3 n 1  Once     05/10/16 1105   05/10/16 1106  DME Bedside commode  Once    Question:  Patient needs a bedside commode to  treat with the following condition  Answer:  Status post total right knee replacement using cement   05/10/16 1105      Diagnostic Studies: Dg Chest 2 View  Result Date: 04/27/2016 CLINICAL DATA:  Preoperative examination prior to knee replacement on May 10, 2016. Current smoker. History of hypertension, hepatitis-C. EXAM: CHEST  2 VIEW COMPARISON:  Chest sent x-ray dated October 30, 2014 FINDINGS: The lungs are adequately inflated and clear. The heart and pulmonary vascularity are normal. The mediastinum is normal in width. There is no pleural effusion. There is mild multilevel degenerative disc disease of the thoracic spine. There is mild S shaped thoracolumbar curvature. IMPRESSION: There is no acute cardiopulmonary abnormality. Electronically Signed   By: David  Martinique M.D.   On: 04/27/2016 13:09   Dg Knee Right Port  Result Date: 05/10/2016 CLINICAL DATA:  Right knee replacement EXAM: PORTABLE RIGHT KNEE - 1-2 VIEW COMPARISON:  07/30/2015 FINDINGS: Changes of right knee replacement. Soft tissue and joint space gas noted. No hardware or bony complicating feature. IMPRESSION: Right knee replacement.  No complicating feature. Electronically Signed   By: Rolm Baptise M.D.   On: 05/10/2016 10:40   Disposition: Plan will be for discharge home today pending progression with PT.  Follow-up Information    Judson Roch, PA-C Follow up in 14 day(s).   Specialty:  Physician Assistant Why:  Levert Feinstein Removal Contact information: [place] [address] [phone]          Signed: Judson Roch PA-C 05/11/2016, 7:58 AM

## 2016-05-11 NOTE — Progress Notes (Signed)
Physical Therapy Treatment Patient Details Name: BYRNECE SCHEIBNER MRN: [id number] DOB: July 11, 1960 Today's Date: 05/11/2016    History of Present Illness Pt admitted for R TKR.     PT Comments    Pt is making good progress towards goals with improved ambulation distance this session. Pt able to perform stair training and ambulate RN station with safe technique. Two rest breaks noted during ambulation secondary to fatigue. Safe technique with there-ex. Written HEP given and all questions addressed. Pt safe to dc home this date. Pt impulsive and educated on improving endurance and performing short distances at home. Will have assistance at home from family.  Follow Up Recommendations  Home health PT     Equipment Recommendations  Rolling walker with 5" wheels    Recommendations for Other Services       Precautions / Restrictions Precautions Precautions: Fall;Knee Precaution Booklet Issued: Yes (comment) Restrictions Weight Bearing Restrictions: Yes RLE Weight Bearing: Weight bearing as tolerated    Mobility  Bed Mobility Overal bed mobility: Needs Assistance Bed Mobility: Supine to Sit     Supine to sit: Supervision Sit to supine: Supervision   General bed mobility comments: Safe technique performed with pt slightly impulsive, needs cues for safe technique. Once seated at EOB, able to sit with independence  Transfers Overall transfer level: Needs assistance Equipment used: Rolling walker (2 wheeled) Transfers: Sit to/from Stand Sit to Stand: Min guard         General transfer comment: safe technique with cues for pushing from seated surface. Once standing, able to stand with upright posture. RW adjusted for height.   Ambulation/Gait Ambulation/Gait assistance: Min guard Ambulation Distance (Feet): 200 Feet Assistive device: Rolling walker (2 wheeled) Gait Pattern/deviations: Step-through pattern     General Gait Details: ambulated using RW and step to gait  pattern able to progress to reciprocal gait pattern. Pt needs cues for correct distance from RW as she tends to stay too far away or too close to RW. Slight forward flexed posture noted during ambulation with heavy WB throughout arms. Two seated rest breaks required during ambulation secondary to fatigue.   Stairs Stairs: Yes   Stair Management: Two rails Number of Stairs: 4 General stair comments: Pt able to go up/down 4 stairs with B rails and min assist with cues for correct technique and sequencing. Therapist demonstrated movement prior to performance. Step to gait pattern noted.  Wheelchair Mobility    Modified Rankin (Stroke Patients Only)       Balance                                    Cognition Arousal/Alertness: Awake/alert Behavior During Therapy: WFL for tasks assessed/performed Overall Cognitive Status: Within Functional Limits for tasks assessed                      Exercises Other Exercises Other Exercises: supine ther-ex performed on R LE including ankle pumps, quad sets, SRLs, hip abd/add, and SAQs. All ther-ex performed x 12 reps with cga. Safe technique performed. Written ther-ex given. Other Exercises: Ambulated to bathroom with cga and cues for technique. Slightly impulsive.    General Comments        Pertinent Vitals/Pain Pain Assessment: 0-10 Pain Score: 5  Pain Location: R knee Pain Descriptors / Indicators: Operative site guarding;Dull;Discomfort Pain Intervention(s): Limited activity within patient's tolerance;Ice applied    Home Living  Prior Function            PT Goals (current goals can now be found in the care plan section) Acute Rehab PT Goals Patient Stated Goal: to go home today PT Goal Formulation: With patient Time For Goal Achievement: 05/24/16 Potential to Achieve Goals: Good Progress towards PT goals: Progressing toward goals    Frequency    BID      PT Plan  Current plan remains appropriate    Co-evaluation             End of Session Equipment Utilized During Treatment: Gait belt Activity Tolerance: Patient limited by pain Patient left: in bed;with bed alarm set;with SCD's reapplied     Time: 1349-1427 PT Time Calculation (min) (ACUTE ONLY): 38 min  Charges:  $Gait Training: 23-37 mins $Therapeutic Exercise: 8-22 mins                    G Codes:      Rajan Burgard 2016-05-14, 2:57 PM  Greggory Stallion, PT, DPT [phone]

## 2016-05-11 NOTE — Progress Notes (Signed)
Notified dr. Roland Rack and [place]ia [place] that PT has cleared the patient to discharge home today.

## 2016-05-11 NOTE — Telephone Encounter (Signed)
Glitch in our system; I was not receiving electronic refill requests from Dec 13 until yesterday; addressed with IT; addressing now ------------------------------------ Reviewed last K+ and Cr Rx approved

## 2016-05-11 NOTE — Progress Notes (Signed)
Patient discharged home. DC instructions provided and explained. Medications reviewed. Rx given. All questions answered. Pt stable at discharge. 

## 2016-05-11 NOTE — Progress Notes (Signed)
Subjective: 1 Day Post-Op Procedure(s) (LRB): TOTAL KNEE ARTHROPLASTY (Right) Patient reports pain as moderate and using CPM machine upon entering the room for exam this AM.   Patient is well, and has had no acute complaints or problems Plan is to go Home after hospital stay. Negative for chest pain and shortness of breath Fever: no Gastrointestinal:Negative for nausea and vomiting, patient states she is passing gas, no abd pain.  Objective: Vital signs in last 24 hours: Temp:  [97.4 F (36.3 C)-98.3 F (36.8 C)] 98.2 F (36.8 C) (01/03 0748) Pulse Rate:  [66-90] 81 (01/03 0748) Resp:  [15-20] 16 (01/03 0343) BP: (101-142)/(55-78) 132/58 (01/03 0748) SpO2:  [92 %-98 %] 96 % (01/03 0748) FiO2 (%):  [2 %] 2 % (01/02 1422)  Intake/Output from previous day:  Intake/Output Summary (Last 24 hours) at 05/11/16 0753 Last data filed at 05/11/16 0509  Gross per 24 hour  Intake             3325 ml  Output             3175 ml  Net              150 ml    Intake/Output this shift: No intake/output data recorded.  Labs:  Recent Labs  05/11/16 0344  HGB 13.1    Recent Labs  05/11/16 0344  WBC 10.9  RBC 4.18  HCT 37.7  PLT 217    Recent Labs  05/11/16 0344  NA 138  K 4.1  CL 104  CO2 28  BUN 9  CREATININE 0.68  GLUCOSE 133*  CALCIUM 8.4*   No results for input(s): LABPT, INR in the last 72 hours.   EXAM General - Patient is Alert, Appropriate and Oriented Extremity - ABD soft Sensation intact distally Intact pulses distally Dorsiflexion/Plantar flexion intact Incision: dressing C/D/I No cellulitis present Dressing/Incision - clean, dry, no drainage Motor Function - intact, moving foot and toes well on exam.   Abd soft on exam, normal BS without tympany.  Past Medical History:  Diagnosis Date  . Anxiety   . Arthritis    rheumatoid arthritis  . Asthma   . Bipolar disorder ([place])   . Depression    bipolar, hx of suicide attempt  . Dyspnea    with  exertion  . GERD (gastroesophageal reflux disease)   . H/O suicide attempt    slit wrists  . History of alcohol abuse   . History of diverticulitis 2013  . History of hepatitis C    HEP "C"--three years ago  . History of MRSA infection 2013  . Hypertension   . Hypothyroidism   . 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 Multinodular thyroid   . OSA (obstructive sleep apnea)    managed by Dr. Manuella Ghazi  . Osteoporosis   . Post-traumatic osteoarthritis of right knee 09/24/2015  . Sleep apnea     Dx 3 years ago. Use C-PAP  . Thyroid disease    Goiter  . Vitamin B12 deficiency   . Vitamin D deficiency disease     Assessment/Plan: 1 Day Post-Op Procedure(s) (LRB): TOTAL KNEE ARTHROPLASTY (Right) Active Problems:   Status post total right knee replacement using cement  Estimated body mass index is 39.14 kg/m as calculated from the following:   Height as of this encounter: 5\' 2"  (1.575 m).   Weight as of this encounter: 97.1 kg (214 lb). Advance diet Up with therapy D/C IV fluids when tolerating po intake.  Labs  reviewed, all within normal limits. Pt is passing gas, no abd distention.  No N/V. Pt would like to go home today, will work with PT prior to possible discharge this afternoon.  DVT Prophylaxis - Lovenox, Foot Pumps and TED hose Weight-Bearing as tolerated to right leg  J. Cameron Proud, PA-C [facility] Orthopaedic Surgery 05/11/2016, 7:53 AM

## 2016-05-11 NOTE — Progress Notes (Signed)
Clinical Social Worker (CSW) received SNF consult. PT is recommending home health. RN case manager is aware of above. Please reconsult if future social work needs arise. CSW signing off.   Yazmyne Sara, LCSW [phone] 

## 2016-05-11 NOTE — Progress Notes (Signed)
Pt with low diastolic, called md before giving B/P medications. Md states ok to give.

## 2016-05-11 NOTE — Progress Notes (Signed)
Foley d/c'd at 0500. 

## 2016-05-11 NOTE — Care Management (Signed)
Walker delivered

## 2016-05-11 NOTE — Progress Notes (Signed)
Physical Therapy Treatment Patient Details Name: Susan [name] MRN: [id number] DOB: 12-04-1960 Today's Date: 05/11/2016    History of Present Illness Pt admitted for R TKR.     PT Comments    Pt is making good progress towards goals, however limited by pain this date. Pt very motivated to dc home, however will still need PM session of therapy to meet standard dc goals. Hesitant for pt to discharge as she still needs hands on assist for mobility secondary to pain. Slight improvement in AAROM, however limited flexion secondary to pain. Upon arrival, pt with pillow under R knee, pillow removed and educated on towel roll under ankle to promote knee extension. Will need to perform stair training prior to dc home. Plan to continue assessment.  Follow Up Recommendations  Home health PT     Equipment Recommendations  Rolling walker with 5" wheels    Recommendations for Other Services       Precautions / Restrictions Precautions Precautions: Fall;Knee Precaution Booklet Issued: No Restrictions Weight Bearing Restrictions: Yes RLE Weight Bearing: Weight bearing as tolerated    Mobility  Bed Mobility Overal bed mobility: Needs Assistance Bed Mobility: Sit to Supine       Sit to supine: Min guard   General bed mobility comments: Pt received in recliner, however at end of session, requesting to return back to bed. Assisted with lifting R LE up onto bed. Cues given for sequencing.  Transfers Overall transfer level: Needs assistance Equipment used: Rolling walker (2 wheeled) Transfers: Sit to/from Stand Sit to Stand: Min guard         General transfer comment: safe technique with cues for pushing from seated surface. Once standing, able to stand with upright posture. RW adjusted for height.   Ambulation/Gait Ambulation/Gait assistance: Min guard Ambulation Distance (Feet): 90 Feet Assistive device: Rolling walker (2 wheeled) Gait Pattern/deviations: Step-to pattern      General Gait Details: ambulated using RW and slow step to gait pattern. With verbal cues, pt able to progress to ambulation with reciprocal gait pattern, however inconsistently. Pt with heavy B UE weight bearing on RW. Needs cues for upright posture. Distance limited by pain.   Stairs            Wheelchair Mobility    Modified Rankin (Stroke Patients Only)       Balance                                    Cognition Arousal/Alertness: Awake/alert Behavior During Therapy: WFL for tasks assessed/performed Overall Cognitive Status: Within Functional Limits for tasks assessed                      Exercises Total Joint Exercises Goniometric ROM: R knee AAROM: 15-81 degrees and limited by pain Other Exercises Other Exercises: supine ther-ex performed on R LE including ankle pumps, quad sets, SLRs, hip abd/add and R knee seated flexion. All ther-ex performed x 12 reps with cga. Safe technique performed    General Comments        Pertinent Vitals/Pain Pain Assessment: 0-10 Pain Score: 10-Worst pain ever Pain Location: R knee Pain Descriptors / Indicators: Operative site guarding;Dull;Discomfort Pain Intervention(s): Limited activity within patient's tolerance;Premedicated before session;Patient requesting pain meds-RN notified;Ice applied    Home Living  Prior Function            PT Goals (current goals can now be found in the care plan section) Acute Rehab PT Goals Patient Stated Goal: to go home today PT Goal Formulation: With patient Time For Goal Achievement: 05/24/16 Potential to Achieve Goals: Good Progress towards PT goals: Progressing toward goals    Frequency    BID      PT Plan Current plan remains appropriate    Co-evaluation             End of Session Equipment Utilized During Treatment: Gait belt Activity Tolerance: Patient limited by pain Patient left: in bed;with bed alarm set;with  SCD's reapplied     Time: 0927-0950 PT Time Calculation (min) (ACUTE ONLY): 23 min  Charges:  $Gait Training: 8-22 mins $Therapeutic Exercise: 8-22 mins                    G Codes:      Marketta Valadez 2016/06/01, 10:30 AM Greggory Stallion, PT, DPT [phone]

## 2016-05-11 NOTE — Care Management Note (Addendum)
Case Management Note  Patient Details  Name: Susan [name] MRN: [id number] Date of Birth: 1961/01/14  Subjective/Objective:     POD # 1 S/P right TKA. Met   with patient at bedside. She lives alone but will have 24 hour help by a female friend at home and other family. She has a 4 wheeled walker and will need a 2 wheeled walker. [facility] from [facility]. [facility].  Pharmacy: [place] [phone] Called Lovenox 40 mg # 14 no refills.                             Action/Plan: [facility] to deliver walker today. [facility].   Expected Discharge Date:   05/11/2016               Expected Discharge Plan:  [place]  In-House Referral:     Discharge planning Services  CM Consult  Post Acute Care Choice:  Durable Medical Equipment, Home Health Choice offered to:  Patient  DME Arranged:  Walker rolling DME Agency:  [facility] Arranged:  PT [facility] Agency:  [place]  Status of Service:  Completed, signed off  If discussed at [place] of Stay Meetings, dates discussed:    Additional Comments:  Jolly Mango, RN 05/11/2016, 10:06 AM

## 2016-05-11 NOTE — Care Management Important Message (Signed)
Important Message  Patient Details  Name: Susan [name] MRN: [id number] Date of Birth: [birth year]   Medicare Important Message Given:  Yes    Jolly Mango, RN 05/11/2016, 10:15 AM

## 2016-05-11 NOTE — Progress Notes (Signed)
Patient has home cpap and bedside. States she does not wish to use tonight. Asked RN to get order if speaks with MD in reference to this patient tonight.

## 2016-05-12 DIAGNOSIS — E538 Deficiency of other specified B group vitamins: Secondary | ICD-10-CM | POA: Diagnosis not present

## 2016-05-12 DIAGNOSIS — G4733 Obstructive sleep apnea (adult) (pediatric): Secondary | ICD-10-CM | POA: Diagnosis not present

## 2016-05-12 DIAGNOSIS — E559 Vitamin D deficiency, unspecified: Secondary | ICD-10-CM | POA: Diagnosis not present

## 2016-05-12 DIAGNOSIS — M069 Rheumatoid arthritis, unspecified: Secondary | ICD-10-CM | POA: Diagnosis not present

## 2016-05-12 DIAGNOSIS — E669 Obesity, unspecified: Secondary | ICD-10-CM | POA: Diagnosis not present

## 2016-05-12 DIAGNOSIS — I10 Essential (primary) hypertension: Secondary | ICD-10-CM | POA: Diagnosis not present

## 2016-05-12 DIAGNOSIS — F319 Bipolar disorder, unspecified: Secondary | ICD-10-CM | POA: Diagnosis not present

## 2016-05-12 DIAGNOSIS — E0520 Thyrotoxicosis with toxic multinodular goiter without thyrotoxic crisis or storm: Secondary | ICD-10-CM | POA: Diagnosis not present

## 2016-05-12 DIAGNOSIS — K439 Ventral hernia without obstruction or gangrene: Secondary | ICD-10-CM | POA: Diagnosis not present

## 2016-05-12 DIAGNOSIS — F17220 Nicotine dependence, chewing tobacco, uncomplicated: Secondary | ICD-10-CM | POA: Diagnosis not present

## 2016-05-12 DIAGNOSIS — K219 Gastro-esophageal reflux disease without esophagitis: Secondary | ICD-10-CM | POA: Diagnosis not present

## 2016-05-12 DIAGNOSIS — Z9181 History of falling: Secondary | ICD-10-CM | POA: Diagnosis not present

## 2016-05-12 DIAGNOSIS — J45909 Unspecified asthma, uncomplicated: Secondary | ICD-10-CM | POA: Diagnosis not present

## 2016-05-12 DIAGNOSIS — Z471 Aftercare following joint replacement surgery: Secondary | ICD-10-CM | POA: Diagnosis not present

## 2016-05-12 DIAGNOSIS — M549 Dorsalgia, unspecified: Secondary | ICD-10-CM | POA: Diagnosis not present

## 2016-05-12 DIAGNOSIS — G8929 Other chronic pain: Secondary | ICD-10-CM | POA: Diagnosis not present

## 2016-05-12 DIAGNOSIS — M6208 Separation of muscle (nontraumatic), other site: Secondary | ICD-10-CM | POA: Diagnosis not present

## 2016-05-12 DIAGNOSIS — Z79891 Long term (current) use of opiate analgesic: Secondary | ICD-10-CM | POA: Diagnosis not present

## 2016-05-12 DIAGNOSIS — Z8614 Personal history of Methicillin resistant Staphylococcus aureus infection: Secondary | ICD-10-CM | POA: Diagnosis not present

## 2016-05-12 DIAGNOSIS — F419 Anxiety disorder, unspecified: Secondary | ICD-10-CM | POA: Diagnosis not present

## 2016-05-12 DIAGNOSIS — Z96651 Presence of right artificial knee joint: Secondary | ICD-10-CM | POA: Diagnosis not present

## 2016-05-12 DIAGNOSIS — M810 Age-related osteoporosis without current pathological fracture: Secondary | ICD-10-CM | POA: Diagnosis not present

## 2016-05-13 DIAGNOSIS — J45909 Unspecified asthma, uncomplicated: Secondary | ICD-10-CM | POA: Diagnosis not present

## 2016-05-13 DIAGNOSIS — Z471 Aftercare following joint replacement surgery: Secondary | ICD-10-CM | POA: Diagnosis not present

## 2016-05-13 DIAGNOSIS — E669 Obesity, unspecified: Secondary | ICD-10-CM | POA: Diagnosis not present

## 2016-05-13 DIAGNOSIS — Z96651 Presence of right artificial knee joint: Secondary | ICD-10-CM | POA: Diagnosis not present

## 2016-05-13 DIAGNOSIS — M069 Rheumatoid arthritis, unspecified: Secondary | ICD-10-CM | POA: Diagnosis not present

## 2016-05-13 DIAGNOSIS — I10 Essential (primary) hypertension: Secondary | ICD-10-CM | POA: Diagnosis not present

## 2016-05-16 DIAGNOSIS — Z96651 Presence of right artificial knee joint: Secondary | ICD-10-CM | POA: Diagnosis not present

## 2016-05-16 DIAGNOSIS — Z471 Aftercare following joint replacement surgery: Secondary | ICD-10-CM | POA: Diagnosis not present

## 2016-05-16 DIAGNOSIS — I10 Essential (primary) hypertension: Secondary | ICD-10-CM | POA: Diagnosis not present

## 2016-05-16 DIAGNOSIS — M069 Rheumatoid arthritis, unspecified: Secondary | ICD-10-CM | POA: Diagnosis not present

## 2016-05-16 DIAGNOSIS — E669 Obesity, unspecified: Secondary | ICD-10-CM | POA: Diagnosis not present

## 2016-05-16 DIAGNOSIS — J45909 Unspecified asthma, uncomplicated: Secondary | ICD-10-CM | POA: Diagnosis not present

## 2016-05-18 DIAGNOSIS — M069 Rheumatoid arthritis, unspecified: Secondary | ICD-10-CM | POA: Diagnosis not present

## 2016-05-18 DIAGNOSIS — E669 Obesity, unspecified: Secondary | ICD-10-CM | POA: Diagnosis not present

## 2016-05-18 DIAGNOSIS — J45909 Unspecified asthma, uncomplicated: Secondary | ICD-10-CM | POA: Diagnosis not present

## 2016-05-18 DIAGNOSIS — Z96651 Presence of right artificial knee joint: Secondary | ICD-10-CM | POA: Diagnosis not present

## 2016-05-18 DIAGNOSIS — I10 Essential (primary) hypertension: Secondary | ICD-10-CM | POA: Diagnosis not present

## 2016-05-18 DIAGNOSIS — Z471 Aftercare following joint replacement surgery: Secondary | ICD-10-CM | POA: Diagnosis not present

## 2016-05-19 DIAGNOSIS — Z471 Aftercare following joint replacement surgery: Secondary | ICD-10-CM | POA: Diagnosis not present

## 2016-05-19 DIAGNOSIS — M069 Rheumatoid arthritis, unspecified: Secondary | ICD-10-CM | POA: Diagnosis not present

## 2016-05-19 DIAGNOSIS — I10 Essential (primary) hypertension: Secondary | ICD-10-CM | POA: Diagnosis not present

## 2016-05-19 DIAGNOSIS — E669 Obesity, unspecified: Secondary | ICD-10-CM | POA: Diagnosis not present

## 2016-05-19 DIAGNOSIS — J45909 Unspecified asthma, uncomplicated: Secondary | ICD-10-CM | POA: Diagnosis not present

## 2016-05-19 DIAGNOSIS — Z96651 Presence of right artificial knee joint: Secondary | ICD-10-CM | POA: Diagnosis not present

## 2016-05-23 ENCOUNTER — Encounter: Payer: Self-pay | Admitting: Surgery

## 2016-05-23 DIAGNOSIS — Z96651 Presence of right artificial knee joint: Secondary | ICD-10-CM | POA: Diagnosis not present

## 2016-05-24 ENCOUNTER — Encounter: Payer: Self-pay | Admitting: Surgery

## 2016-05-30 DIAGNOSIS — Z96651 Presence of right artificial knee joint: Secondary | ICD-10-CM | POA: Diagnosis not present

## 2016-05-30 DIAGNOSIS — M25661 Stiffness of right knee, not elsewhere classified: Secondary | ICD-10-CM | POA: Diagnosis not present

## 2016-05-30 DIAGNOSIS — M6281 Muscle weakness (generalized): Secondary | ICD-10-CM | POA: Diagnosis not present

## 2016-05-30 DIAGNOSIS — M25561 Pain in right knee: Secondary | ICD-10-CM | POA: Diagnosis not present

## 2016-06-01 DIAGNOSIS — Z96651 Presence of right artificial knee joint: Secondary | ICD-10-CM | POA: Diagnosis not present

## 2016-06-03 DIAGNOSIS — M6281 Muscle weakness (generalized): Secondary | ICD-10-CM | POA: Diagnosis not present

## 2016-06-03 DIAGNOSIS — M25561 Pain in right knee: Secondary | ICD-10-CM | POA: Diagnosis not present

## 2016-06-03 DIAGNOSIS — Z96651 Presence of right artificial knee joint: Secondary | ICD-10-CM | POA: Diagnosis not present

## 2016-06-03 DIAGNOSIS — M25661 Stiffness of right knee, not elsewhere classified: Secondary | ICD-10-CM | POA: Diagnosis not present

## 2016-06-07 ENCOUNTER — Other Ambulatory Visit: Payer: Self-pay | Admitting: Family Medicine

## 2016-06-07 DIAGNOSIS — M6281 Muscle weakness (generalized): Secondary | ICD-10-CM | POA: Diagnosis not present

## 2016-06-07 DIAGNOSIS — Z96651 Presence of right artificial knee joint: Secondary | ICD-10-CM | POA: Diagnosis not present

## 2016-06-07 DIAGNOSIS — M25661 Stiffness of right knee, not elsewhere classified: Secondary | ICD-10-CM | POA: Diagnosis not present

## 2016-06-07 DIAGNOSIS — M25561 Pain in right knee: Secondary | ICD-10-CM | POA: Diagnosis not present

## 2016-06-08 DIAGNOSIS — Z96651 Presence of right artificial knee joint: Secondary | ICD-10-CM | POA: Diagnosis not present

## 2016-06-08 DIAGNOSIS — M25661 Stiffness of right knee, not elsewhere classified: Secondary | ICD-10-CM | POA: Diagnosis not present

## 2016-06-08 DIAGNOSIS — M25561 Pain in right knee: Secondary | ICD-10-CM | POA: Diagnosis not present

## 2016-06-08 DIAGNOSIS — M6281 Muscle weakness (generalized): Secondary | ICD-10-CM | POA: Diagnosis not present

## 2016-06-13 DIAGNOSIS — Z96651 Presence of right artificial knee joint: Secondary | ICD-10-CM | POA: Diagnosis not present

## 2016-06-15 DIAGNOSIS — Z96651 Presence of right artificial knee joint: Secondary | ICD-10-CM | POA: Diagnosis not present

## 2016-06-15 DIAGNOSIS — M25661 Stiffness of right knee, not elsewhere classified: Secondary | ICD-10-CM | POA: Diagnosis not present

## 2016-06-15 DIAGNOSIS — M6281 Muscle weakness (generalized): Secondary | ICD-10-CM | POA: Diagnosis not present

## 2016-06-15 DIAGNOSIS — M25561 Pain in right knee: Secondary | ICD-10-CM | POA: Diagnosis not present

## 2016-06-17 DIAGNOSIS — M25661 Stiffness of right knee, not elsewhere classified: Secondary | ICD-10-CM | POA: Diagnosis not present

## 2016-06-17 DIAGNOSIS — M6281 Muscle weakness (generalized): Secondary | ICD-10-CM | POA: Diagnosis not present

## 2016-06-17 DIAGNOSIS — Z96651 Presence of right artificial knee joint: Secondary | ICD-10-CM | POA: Diagnosis not present

## 2016-06-17 DIAGNOSIS — M25561 Pain in right knee: Secondary | ICD-10-CM | POA: Diagnosis not present

## 2016-06-20 DIAGNOSIS — M25561 Pain in right knee: Secondary | ICD-10-CM | POA: Diagnosis not present

## 2016-06-20 DIAGNOSIS — M48062 Spinal stenosis, lumbar region with neurogenic claudication: Secondary | ICD-10-CM | POA: Diagnosis not present

## 2016-06-20 DIAGNOSIS — M5416 Radiculopathy, lumbar region: Secondary | ICD-10-CM | POA: Diagnosis not present

## 2016-06-20 DIAGNOSIS — M5136 Other intervertebral disc degeneration, lumbar region: Secondary | ICD-10-CM | POA: Diagnosis not present

## 2016-06-20 DIAGNOSIS — Z96651 Presence of right artificial knee joint: Secondary | ICD-10-CM | POA: Diagnosis not present

## 2016-06-29 DIAGNOSIS — F411 Generalized anxiety disorder: Secondary | ICD-10-CM | POA: Diagnosis not present

## 2016-07-06 DIAGNOSIS — F411 Generalized anxiety disorder: Secondary | ICD-10-CM | POA: Diagnosis not present

## 2016-07-11 ENCOUNTER — Ambulatory Visit: Payer: Medicare Other | Admitting: Family Medicine

## 2016-07-15 ENCOUNTER — Encounter: Payer: Self-pay | Admitting: Family Medicine

## 2016-07-15 ENCOUNTER — Ambulatory Visit (INDEPENDENT_AMBULATORY_CARE_PROVIDER_SITE_OTHER): Payer: Medicare Other | Admitting: Family Medicine

## 2016-07-15 DIAGNOSIS — F1021 Alcohol dependence, in remission: Secondary | ICD-10-CM

## 2016-07-15 DIAGNOSIS — F17200 Nicotine dependence, unspecified, uncomplicated: Secondary | ICD-10-CM

## 2016-07-15 DIAGNOSIS — E6609 Other obesity due to excess calories: Secondary | ICD-10-CM | POA: Diagnosis not present

## 2016-07-15 DIAGNOSIS — E0590 Thyrotoxicosis, unspecified without thyrotoxic crisis or storm: Secondary | ICD-10-CM | POA: Diagnosis not present

## 2016-07-15 DIAGNOSIS — F3170 Bipolar disorder, currently in remission, most recent episode unspecified: Secondary | ICD-10-CM

## 2016-07-15 DIAGNOSIS — R7301 Impaired fasting glucose: Secondary | ICD-10-CM

## 2016-07-15 DIAGNOSIS — Z6838 Body mass index (BMI) 38.0-38.9, adult: Secondary | ICD-10-CM | POA: Diagnosis not present

## 2016-07-15 DIAGNOSIS — F1020 Alcohol dependence, uncomplicated: Secondary | ICD-10-CM | POA: Diagnosis not present

## 2016-07-15 DIAGNOSIS — E559 Vitamin D deficiency, unspecified: Secondary | ICD-10-CM | POA: Diagnosis not present

## 2016-07-15 MED ORDER — ROPINIROLE HCL 0.25 MG PO TABS: 0.2500 mg | ORAL_TABLET | Freq: Every day | ORAL | 0 refills | Status: DC

## 2016-07-15 MED ORDER — VITAMIN D (ERGOCALCIFEROL) 1.25 MG (50000 UNIT) PO CAPS: 50000.0000 [IU] | ORAL_CAPSULE | ORAL | 1 refills | Status: DC

## 2016-07-15 NOTE — Assessment & Plan Note (Signed)
So pleased with how patient is doing; encouragement given

## 2016-07-15 NOTE — Assessment & Plan Note (Signed)
So glad patient is feeling better, sober, losing weight; encouragement given

## 2016-07-15 NOTE — Assessment & Plan Note (Addendum)
Encouraged cessation; she's not quite there yet; call me if I can help if/when the day comes

## 2016-07-15 NOTE — Progress Notes (Signed)
BP 116/64   Pulse 87   Temp 97.7 F (36.5 C) (Oral)   Resp 16   Wt 208 lb (94.3 kg)   SpO2 95%   BMI 38.04 kg/m    Subjective:    Patient ID: Susan [name], female    DOB: 03/17/1961, 56 y.o.   MRN: [id number]  HPI: Susan [name] is a 56 y.o. female  Chief Complaint  Patient presents with  . Follow-up   Since last visit, Dr. 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; her sister told her about that She went home and kept feeling like something was just not right; all over the place mentally, but she is aware of it; no thoughts of hurting others; has become obsessed with death; her sister is in the hospital right now and she is wondering who is next; she is worried about her sister No plan and no intentional; just thinking about death a lot in general; getting her house in order, so they know where things are; I asked if working with counselor; she goes to see her psychiatrist next week; she is in a group for therapy; she will drop the group to get a separate therapist; trying to stay busy  She is clean and sober, 16 months now Trouble sleeping; her legs start hurting and she rubs them, jerking them, restless legs; physical has to move them or it's uncomfortable  She had to quit famotidine; has had gas like you would not believe; sleep apnea pushing air in her; not taking any medicine; mostly below  Vitamin D deficiency, was 11.4 and then 16 and 16; she is taking 1,000 iu   Depression screen [facility] 2/9 07/15/2016 04/12/2016 12/03/2015 10/09/2015 09/24/2015  Decreased Interest 3 0 0 0 3  Down, Depressed, Hopeless 3 0 1 1 1   PHQ - 2 Score 6 0 1 1 4   Altered sleeping 3 - - - 3  Tired, decreased energy 2 - - - 3  Change in appetite 1 - - - 3  Feeling bad or failure about yourself  1 - - - 1  Trouble  concentrating 2 - - - 3  Moving slowly or fidgety/restless 0 - - - 3  Suicidal thoughts 0 - - - 0  PHQ-9 Score 15 - - - 20  Difficult doing work/chores Somewhat difficult - - - Somewhat difficult   Relevant past medical, surgical, family and social history reviewed Past Medical History:  Diagnosis Date  . Anxiety   . Arthritis    rheumatoid arthritis  . Asthma   . Bipolar disorder ([place])   . Depression    bipolar, hx of suicide attempt  . Dyspnea    with exertion  . GERD (gastroesophageal reflux disease)   . H/O suicide attempt    slit wrists  . History of alcohol abuse   . History of diverticulitis 2013  . History of hepatitis C    HEP "C"--three years ago  . History of MRSA infection 2013  . Hypertension   . Hypothyroidism   . 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 Multinodular thyroid   . OSA (obstructive sleep apnea)    managed by Dr. Manuella Ghazi  . Osteoporosis   . Post-traumatic osteoarthritis of right knee 09/24/2015  . Sleep apnea     Dx 3  years ago. Use C-PAP  . Thyroid disease    Goiter  . Vitamin B12 deficiency   . Vitamin D deficiency disease    Past Surgical History:  Procedure Laterality Date  . BILATERAL SALPINGOOPHORECTOMY     due to abnormal mass  . BREAST SURGERY Left 20 yrs ago  . CESAREAN SECTION    . HERNIA REPAIR  06/2011, July 2014   Ventral wall repair with Physiomesh  . TONSILLECTOMY    . TOTAL KNEE ARTHROPLASTY Right 05/10/2016   Procedure: TOTAL KNEE ARTHROPLASTY;  Surgeon: Corky Mull, MD;  Location: [facility] ORS;  Service: Orthopedics;  Laterality: Right;  . TUBAL LIGATION     Family History  Problem Relation Age of Onset  . Alcohol abuse Father   . Heart attack Father   . Arthritis Mother   . Asthma Mother   . Mental illness Mother   . Thyroid disease Mother   . COPD Mother   . Heart disease Mother   . Congestive Heart Failure Mother   . Arthritis Brother   . Mental illness Brother   . Cancer Brother     non-hodkins lymphoma  . Alcohol abuse  Sister   . Drug abuse Sister   . Mental illness Sister   . Mental illness Sister   . Fibromyalgia Sister   . Obesity Sister   . Pneumonia Sister   . Mental illness Sister   . Alcohol abuse Sister   . Drug abuse Sister   . Diabetes Neg Hx   . Stroke Neg Hx    Social History  Substance Use Topics  . Smoking status: Current Every Day Smoker    Packs/day: 0.50    Years: 30.00    Types: Cigarettes  . Smokeless tobacco: Never Used  . Alcohol use No     Comment: 16 months sober   Interim medical history since last visit reviewed. Allergies and medications reviewed  Review of Systems Per HPI unless specifically indicated above     Objective:    BP 116/64   Pulse 87   Temp 97.7 F (36.5 C) (Oral)   Resp 16   Wt 208 lb (94.3 kg)   SpO2 95%   BMI 38.04 kg/m   Wt Readings from Last 3 Encounters:  07/15/16 208 lb (94.3 kg)  05/10/16 214 lb (97.1 kg)  04/27/16 214 lb (97.1 kg)    Physical Exam  Constitutional: She appears well-developed and well-nourished. No distress.  HENT:  Head: Normocephalic and atraumatic.  Eyes: EOM are normal. No scleral icterus.  Neck: No thyromegaly present.  Cardiovascular: Normal rate, regular rhythm and normal heart sounds.   No murmur heard. Pulmonary/Chest: Effort normal and breath sounds normal. No respiratory distress. She has no wheezes. She exhibits no tenderness and no bony tenderness.  Abdominal: Soft. Bowel sounds are normal. She exhibits no distension. There is no tenderness.  Musculoskeletal: Normal range of motion. She exhibits no edema.  Neurological: She is alert.  Skin: Skin is warm and dry. She is not diaphoretic. No pallor.  Psychiatric: She has a normal mood and affect. Her behavior is normal. Judgment and thought content normal. Her mood appears not anxious. She does not exhibit a depressed mood.   Results for orders placed or performed during the hospital encounter of 05/10/16  Urine Drug Screen, Qualitative ([facility] only)   Result Value Ref Range   Tricyclic, Ur Screen POSITIVE (A) NONE DETECTED   Amphetamines, Ur Screen NONE DETECTED NONE DETECTED  MDMA (Ecstasy)Ur Screen NONE DETECTED NONE DETECTED   Cocaine Metabolite,Ur [place] NONE DETECTED NONE DETECTED   Opiate, Ur Screen POSITIVE (A) NONE DETECTED   Phencyclidine (PCP) Ur S NONE DETECTED NONE DETECTED   Cannabinoid 50 Ng, Ur [place] NONE DETECTED NONE DETECTED   Barbiturates, Ur Screen NONE DETECTED NONE DETECTED   Benzodiazepine, Ur Scrn NONE DETECTED NONE DETECTED   Methadone Scn, Ur NONE DETECTED NONE DETECTED  CBC with Differential/Platelet  Result Value Ref Range   WBC 10.9 3.6 - 11.0 K/uL   RBC 4.18 3.80 - 5.20 MIL/uL   Hemoglobin 13.1 12.0 - 16.0 g/dL   HCT 37.7 35.0 - 47.0 %   MCV 90.1 80.0 - 100.0 fL   MCH 31.4 26.0 - 34.0 pg   MCHC 34.8 32.0 - 36.0 g/dL   RDW 12.9 11.5 - 14.5 %   Platelets 217 150 - 440 K/uL   Neutrophils Relative % 72 %   Neutro Abs 7.9 (H) 1.4 - 6.5 K/uL   Lymphocytes Relative 18 %   Lymphs Abs 1.9 1.0 - 3.6 K/uL   Monocytes Relative 7 %   Monocytes Absolute 0.8 0.2 - 0.9 K/uL   Eosinophils Relative 2 %   Eosinophils Absolute 0.2 0 - 0.7 K/uL   Basophils Relative 1 %   Basophils Absolute 0.1 0 - 0.1 K/uL  Basic metabolic panel  Result Value Ref Range   Sodium 138 135 - 145 mmol/L   Potassium 4.1 3.5 - 5.1 mmol/L   Chloride 104 101 - 111 mmol/L   CO2 28 22 - 32 mmol/L   Glucose, Bld 133 (H) 65 - 99 mg/dL   BUN 9 6 - 20 mg/dL   Creatinine, Ser 0.68 0.44 - 1.00 mg/dL   Calcium 8.4 (L) 8.9 - 10.3 mg/dL   GFR calc non Af Amer >60 >60 mL/min   GFR calc Af Amer >60 >60 mL/min   Anion gap 6 5 - 15  ABO/Rh  Result Value Ref Range   ABO/RH(D) B POS       Assessment & Plan:   Problem List Items Addressed This Visit      Endocrine   Subclinical hyperthyroidism    Managed by endocrinologist      Impaired fasting glucose    Last A1c was actually 5.3; excellent control (December 2017)        Other   Vitamin  D deficiency    Will treat with Rx vitamin D for another 8 weeks (cheaper to treat than test, I believe), then she can resume 1,000 iu daily OTC vitamin D3      Tobacco dependence    Encouraged cessation; she's not quite there yet; call me if I can help if/when the day comes      Obesity    So glad patient is feeling better, sober, losing weight; encouragement given      Bipolar disorder ([place])    With some thoughts about death, but no SI/HI and no plan; she'll see psychiatrist next week and work with therapist; start vitamin D Rx in case low D is impacting her mood; she agrees to seek medical help right away for any thoughts of self-harm or hurting others      Alcoholism in recovery [facility])    So pleased with how patient is doing; encouragement given         Follow up plan: Return in about 3 months (around 10/15/2016) for visit and fasting labs.  An after-visit summary was printed  and given to the patient at [place].  Please see the patient instructions which may contain other information and recommendations beyond what is mentioned above in the assessment and plan.  Meds ordered this encounter  Medications  . Naproxen Sodium (ALEVE PO)    Sig: Take 1 tablet by mouth 2 (two) times daily.  . Vitamin D, Ergocalciferol, (DRISDOL) 50000 units CAPS capsule    Sig: Take 1 capsule (50,000 Units total) by mouth every 7 (seven) days.    Dispense:  4 capsule    Refill:  1  . rOPINIRole (REQUIP) 0.25 MG tablet    Sig: Take 1 tablet (0.25 mg total) by mouth at bedtime.    Dispense:  30 tablet    Refill:  0    No orders of the defined types were placed in this encounter.

## 2016-07-15 NOTE — Patient Instructions (Addendum)
If you develop any thoughts of self-harm or hurting others, contact crisis line or seek medical help immediately Try Simethicone for gas (OTC) Avoid beans, broccoli, bread, and bagels for a week Start the vitamin D prescription once a week for 8 weeks, then you can start the 1,000 iu daily vitamin D

## 2016-07-15 NOTE — Assessment & Plan Note (Signed)
With some thoughts about death, but no SI/HI and no plan; she'll see psychiatrist next week and work with therapist; start vitamin D Rx in case low D is impacting her mood; she agrees to seek medical help right away for any thoughts of self-harm or hurting others

## 2016-07-15 NOTE — Assessment & Plan Note (Signed)
Will treat with Rx vitamin D for another 8 weeks (cheaper to treat than test, I believe), then she can resume 1,000 iu daily OTC vitamin D3

## 2016-07-15 NOTE — Assessment & Plan Note (Signed)
Last A1c was actually 5.3; excellent control (December 2017)

## 2016-07-15 NOTE — Assessment & Plan Note (Signed)
Managed by endocrinologist

## 2016-07-20 DIAGNOSIS — F4310 Post-traumatic stress disorder, unspecified: Secondary | ICD-10-CM | POA: Diagnosis not present

## 2016-07-21 ENCOUNTER — Ambulatory Visit: Payer: Commercial Managed Care - HMO | Admitting: General Surgery

## 2016-07-21 ENCOUNTER — Telehealth: Payer: Self-pay | Admitting: *Deleted

## 2016-07-21 NOTE — Telephone Encounter (Signed)
Contacted patient regarding follow up appointment with Dr. Jamal Collin, at this time patient wishes to wait to follow up. Patient states she is doing well and will call us to follow up for appointment.

## 2016-07-25 ENCOUNTER — Ambulatory Visit: Payer: Commercial Managed Care - HMO | Admitting: General Surgery

## 2016-08-04 ENCOUNTER — Other Ambulatory Visit: Payer: Self-pay | Admitting: Family Medicine

## 2016-08-17 DIAGNOSIS — F411 Generalized anxiety disorder: Secondary | ICD-10-CM | POA: Diagnosis not present

## 2016-08-24 DIAGNOSIS — Z96651 Presence of right artificial knee joint: Secondary | ICD-10-CM | POA: Diagnosis not present

## 2016-09-01 ENCOUNTER — Other Ambulatory Visit: Payer: Self-pay | Admitting: Family Medicine

## 2016-09-08 ENCOUNTER — Encounter: Payer: Self-pay | Admitting: *Deleted

## 2016-09-21 DIAGNOSIS — F411 Generalized anxiety disorder: Secondary | ICD-10-CM | POA: Diagnosis not present

## 2016-09-23 DIAGNOSIS — M5136 Other intervertebral disc degeneration, lumbar region: Secondary | ICD-10-CM | POA: Diagnosis not present

## 2016-09-23 DIAGNOSIS — M48062 Spinal stenosis, lumbar region with neurogenic claudication: Secondary | ICD-10-CM | POA: Diagnosis not present

## 2016-09-23 DIAGNOSIS — M5416 Radiculopathy, lumbar region: Secondary | ICD-10-CM | POA: Diagnosis not present

## 2016-09-28 ENCOUNTER — Encounter: Payer: Self-pay | Admitting: Family Medicine

## 2016-09-28 DIAGNOSIS — M51379 Other intervertebral disc degeneration, lumbosacral region without mention of lumbar back pain or lower extremity pain: Secondary | ICD-10-CM | POA: Insufficient documentation

## 2016-09-28 DIAGNOSIS — M5136 Other intervertebral disc degeneration, lumbar region: Secondary | ICD-10-CM | POA: Insufficient documentation

## 2016-09-28 DIAGNOSIS — M5137 Other intervertebral disc degeneration, lumbosacral region: Secondary | ICD-10-CM

## 2016-09-28 HISTORY — DX: Other intervertebral disc degeneration, lumbosacral region without mention of lumbar back pain or lower extremity pain: M51.379

## 2016-09-28 HISTORY — DX: Other intervertebral disc degeneration, lumbosacral region: M51.37

## 2016-10-04 DIAGNOSIS — R079 Chest pain, unspecified: Secondary | ICD-10-CM | POA: Diagnosis not present

## 2016-10-04 DIAGNOSIS — Z01810 Encounter for preprocedural cardiovascular examination: Secondary | ICD-10-CM | POA: Diagnosis not present

## 2016-10-04 DIAGNOSIS — I10 Essential (primary) hypertension: Secondary | ICD-10-CM | POA: Diagnosis not present

## 2016-10-04 DIAGNOSIS — Z720 Tobacco use: Secondary | ICD-10-CM | POA: Diagnosis not present

## 2016-10-04 DIAGNOSIS — G4733 Obstructive sleep apnea (adult) (pediatric): Secondary | ICD-10-CM | POA: Diagnosis not present

## 2016-10-14 ENCOUNTER — Encounter: Payer: Self-pay | Admitting: Family Medicine

## 2016-10-14 ENCOUNTER — Ambulatory Visit (INDEPENDENT_AMBULATORY_CARE_PROVIDER_SITE_OTHER): Payer: Medicare Other | Admitting: Family Medicine

## 2016-10-14 VITALS — BP 118/66 | HR 81 | Temp 97.6°F | Resp 16 | Ht 62.0 in | Wt 223.9 lb

## 2016-10-14 DIAGNOSIS — E0590 Thyrotoxicosis, unspecified without thyrotoxic crisis or storm: Secondary | ICD-10-CM | POA: Diagnosis not present

## 2016-10-14 DIAGNOSIS — E559 Vitamin D deficiency, unspecified: Secondary | ICD-10-CM

## 2016-10-14 DIAGNOSIS — E786 Lipoprotein deficiency: Secondary | ICD-10-CM

## 2016-10-14 DIAGNOSIS — Z1231 Encounter for screening mammogram for malignant neoplasm of breast: Secondary | ICD-10-CM

## 2016-10-14 DIAGNOSIS — Z5181 Encounter for therapeutic drug level monitoring: Secondary | ICD-10-CM

## 2016-10-14 DIAGNOSIS — I10 Essential (primary) hypertension: Secondary | ICD-10-CM

## 2016-10-14 DIAGNOSIS — F1021 Alcohol dependence, in remission: Secondary | ICD-10-CM

## 2016-10-14 DIAGNOSIS — Z1239 Encounter for other screening for malignant neoplasm of breast: Secondary | ICD-10-CM

## 2016-10-14 DIAGNOSIS — R7301 Impaired fasting glucose: Secondary | ICD-10-CM

## 2016-10-14 LAB — COMPLETE METABOLIC PANEL WITH GFR
ALT: 16 U/L (ref 6–29)
AST: 16 U/L (ref 10–35)
Albumin: 4.1 g/dL (ref 3.6–5.1)
Alkaline Phosphatase: 69 U/L (ref 33–130)
BUN: 9 mg/dL (ref 7–25)
CO2: 26 mmol/L (ref 20–31)
Calcium: 9.2 mg/dL (ref 8.6–10.4)
Chloride: 107 mmol/L (ref 98–110)
Creat: 0.72 mg/dL (ref 0.50–1.05)
GFR, Est African American: >89 mL/min (ref >=60)
GFR, Est Non African American: >89 mL/min (ref >=60)
Glucose, Bld: 93 mg/dL (ref 65–99)
Potassium: 4.2 mmol/L (ref 3.5–5.3)
Sodium: 140 mmol/L (ref 135–146)
Total Bilirubin: 0.5 mg/dL (ref 0.2–1.2)
Total Protein: 6.5 g/dL (ref 6.1–8.1)

## 2016-10-14 LAB — CBC WITH DIFFERENTIAL/PLATELET
Basophils Absolute: 0 cells/uL (ref 0–200)
Basophils Relative: 0 %
Eosinophils Absolute: 156 cells/uL (ref 15–500)
Eosinophils Relative: 2 %
HCT: 41.1 % (ref 35.0–45.0)
Hemoglobin: 13.4 g/dL (ref 11.7–15.5)
Lymphocytes Relative: 32 %
Lymphs Abs: 2496 cells/uL (ref 850–3900)
MCH: 30.7 pg (ref 27.0–33.0)
MCHC: 32.6 g/dL (ref 32.0–36.0)
MCV: 94.3 fL (ref 80.0–100.0)
MPV: 8.5 fL (ref 7.5–12.5)
Monocytes Absolute: 390 cells/uL (ref 200–950)
Monocytes Relative: 5 %
Neutro Abs: 4758 cells/uL (ref 1500–7800)
Neutrophils Relative %: 61 %
Platelets: 237 10*3/uL (ref 140–400)
RBC: 4.36 MIL/uL (ref 3.80–5.10)
RDW: 13 % (ref 11.0–15.0)
WBC: 7.8 10*3/uL (ref 3.8–10.8)

## 2016-10-14 LAB — LIPID PANEL
Cholesterol: 214 mg/dL — ABNORMAL HIGH (ref <200)
HDL: 38 mg/dL — ABNORMAL LOW (ref >50)
LDL Cholesterol: 149 mg/dL — ABNORMAL HIGH (ref <100)
Total CHOL/HDL Ratio: 5.6 Ratio — ABNORMAL HIGH (ref <5.0)
Triglycerides: 135 mg/dL (ref <150)
VLDL: 27 mg/dL (ref <30)

## 2016-10-14 LAB — TSH: TSH: 0.94 mIU/L

## 2016-10-14 LAB — T4, FREE: Free T4: 0.8 ng/dL (ref 0.8–1.8)

## 2016-10-14 LAB — T3, FREE: T3, Free: 3 pg/mL (ref 2.3–4.2)

## 2016-10-14 NOTE — Assessment & Plan Note (Signed)
Craving sweets; off of alcohol though, encouraged recovery work

## 2016-10-14 NOTE — Assessment & Plan Note (Signed)
Check lipid panel; weight loss should help raise HDL

## 2016-10-14 NOTE — Assessment & Plan Note (Signed)
Check thyroid panel 

## 2016-10-14 NOTE — Progress Notes (Signed)
BP 118/66 (BP Location: Left Arm, Patient Position: Sitting, Cuff Size: Normal)   Pulse 81   Temp 97.6 F (36.4 C) (Oral)   Resp 16   Ht 5\' 2"  (1.575 m)   Wt 223 lb 14.4 oz (101.6 kg)   SpO2 98%   BMI 40.95 kg/m    Subjective:    Patient ID: Susan [name], female    DOB: [birth year], 56 y.o.   MRN: [id number]  HPI: Susan [name] is a 56 y.o. female  Chief Complaint  Patient presents with  . Follow-up    3 month   . Medication Reaction    Pt might be having rection to requip. Pt right leg is swollen    HPI She thinks the new medicine has caused her to gain weight She wakes up ravenous Some swelling in her legs, right more than left; no redness, no heat, no pain in the calf; no hx of DVT Fluid retention, not adding salt No urinating as much, little short of breath She had radiation for her thyroid; no one else has checked thyroid recently Quit drinking, and really thought she would lose weight Craving sweets; cutting back artificial sweeteners; even really busy, active, cutting grass Prediabetes, A1c had been 6.08 October 2015, then down to 5.7 in December 2017; bad cravings; no diabetes in the family Stress with her sons, moved back in Trying to snack better Vitamin D deficiency; last level was 16 in December 2017  Lab Results  Component Value Date   CHOL 172 04/12/2016   CHOL 182 10/09/2015   CHOL 181 04/09/2015   Lab Results  Component Value Date   HDL 43 (L) 04/12/2016   HDL 42 10/09/2015   HDL 36 (L) 04/09/2015   Lab Results  Component Value Date   LDLCALC 103 (H) 04/12/2016   LDLCALC 119 (H) 10/09/2015   LDLCALC 118 (H) 04/09/2015   Lab Results  Component Value Date   TRIG 129 04/12/2016   TRIG 106 10/09/2015   TRIG 133 04/09/2015   Lab Results  Component Value Date   CHOLHDL 4.0 04/12/2016   No results found for: LDLDIRECT   Depression screen [facility] 2/9 10/14/2016 07/15/2016 04/12/2016 12/03/2015 10/09/2015  Decreased Interest 0 3 0 0 0  Down,  Depressed, Hopeless 0 3 0 1 1  PHQ - 2 Score 0 6 0 1 1  Altered sleeping - 3 - - -  Tired, decreased energy - 2 - - -  Change in appetite - 1 - - -  Feeling bad or failure about yourself  - 1 - - -  Trouble concentrating - 2 - - -  Moving slowly or fidgety/restless - 0 - - -  Suicidal thoughts - 0 - - -  PHQ-9 Score - 15 - - -  Difficult doing work/chores - Somewhat difficult - - -    Relevant past medical, surgical, family and social history reviewed Past Medical History:  Diagnosis Date  . Anxiety   . Arthritis    rheumatoid arthritis  . Asthma   . Bipolar disorder ([place])   . Degenerative disc disease at L5-S1 level 09/28/2016   See ortho note May 2018  . Depression    bipolar, hx of suicide attempt  . Dyspnea    with exertion  . GERD (gastroesophageal reflux disease)   . H/O suicide attempt    slit wrists  . History of alcohol abuse   . History of diverticulitis 2013  . History of  hepatitis C    HEP "C"--three years ago  . History of MRSA infection 2013  . Hypertension   . Hypothyroidism   . 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 Multinodular thyroid   . OSA (obstructive sleep apnea)    managed by Dr. Manuella Ghazi  . Osteoporosis   . Post-traumatic osteoarthritis of right knee 09/24/2015  . Sleep apnea     Dx 3 years ago. Use C-PAP  . Thyroid disease    Goiter  . Vitamin B12 deficiency   . Vitamin D deficiency disease    Past Surgical History:  Procedure Laterality Date  . BILATERAL SALPINGOOPHORECTOMY     due to abnormal mass  . BREAST SURGERY Left 20 yrs ago  . CESAREAN SECTION    . HERNIA REPAIR  06/2011, July 2014   Ventral wall repair with Physiomesh  . TONSILLECTOMY    . TOTAL KNEE ARTHROPLASTY Right 05/10/2016   Procedure: TOTAL KNEE ARTHROPLASTY;  Surgeon: Corky Mull, MD;  Location: [facility] ORS;  Service: Orthopedics;  Laterality: Right;  . TUBAL LIGATION     Family History  Problem Relation Age of Onset  . Alcohol abuse Father   . Heart attack Father   . Arthritis  Mother   . Asthma Mother   . Mental illness Mother   . Thyroid disease Mother   . COPD Mother   . Heart disease Mother   . Congestive Heart Failure Mother   . Arthritis Brother   . Mental illness Brother   . Cancer Brother        non-hodkins lymphoma  . Alcohol abuse Sister   . Drug abuse Sister   . Mental illness Sister   . Mental illness Sister   . Fibromyalgia Sister   . Obesity Sister   . Pneumonia Sister   . Mental illness Sister   . Alcohol abuse Sister   . Drug abuse Sister   . Diabetes Neg Hx   . Stroke Neg Hx    Social History   Social History  . Marital status: Widowed    Spouse name: N/A  . Number of children: N/A  . Years of education: N/A   Occupational History  . Not on file.   Social History Main Topics  . Smoking status: Current Every Day Smoker    Packs/day: 0.50    Years: 30.00    Types: Cigarettes  . Smokeless tobacco: Never Used  . Alcohol use No     Comment: 16 months sober  . 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 Sexual activity: Yes   Other Topics Concern  . Not on file   Social History Narrative  . No narrative on file    Interim medical history since last visit reviewed. Allergies and medications reviewed  Review of Systems Per HPI unless specifically indicated above     Objective:    BP 118/66 (BP Location: Left Arm, Patient Position: Sitting, Cuff Size: Normal)   Pulse 81   Temp 97.6 F (36.4 C) (Oral)   Resp 16   Ht 5\' 2"  (1.575 m)   Wt 223 lb 14.4 oz (101.6 kg)   SpO2 98%   BMI 40.95 kg/m   Wt Readings from Last 3 Encounters:  10/14/16 223 lb 14.4 oz (101.6 kg)  07/15/16 208 lb (94.3 kg)  05/10/16 214 lb (97.1 kg)    Physical Exam  Constitutional: She appears well-developed and well-nourished. No distress.  Weight gain 15 pounds over last 3 months  HENT:  Head: Normocephalic and atraumatic.  Eyes: EOM are normal. No scleral icterus.  Neck: No thyromegaly present.    Cardiovascular: Normal rate, regular rhythm and normal heart sounds.   No murmur heard. Pulmonary/Chest: Effort normal and breath sounds normal. No respiratory distress. She has no wheezes.  Abdominal: Soft. Bowel sounds are normal. She exhibits no distension.  Musculoskeletal: Normal range of motion. She exhibits edema (right more than left; no erythema; unable to Homan's sign on the right).  Neurological: She is alert. She exhibits normal muscle tone.  Skin: Skin is warm and dry. She is not diaphoretic. No pallor.  Psychiatric: She has a normal mood and affect. Her behavior is normal. Judgment and thought content normal.      Assessment & Plan:   Problem List Items Addressed This Visit      Cardiovascular and Mediastinum   Hypertension - Primary    Well-controlled        Endocrine   Subclinical hyperthyroidism    Check thyroid panel      Relevant Orders   T3, free   T4, free   TSH   Impaired fasting glucose    Will recheck A1c and glucose with her recent weight gain and sugar cravings      Relevant Orders   Hemoglobin A1c   Lipid panel     Other   Vitamin D deficiency    Check level and supplement if needed      Relevant Orders   VITAMIN D 25 Hydroxy (Vit-D Deficiency, Fractures)   Morbid obesity ([place])    Patient will check with insurance to see if Saxenda covered      Medication monitoring encounter    Check labs      Relevant Orders   CBC with Differential/Platelet   COMPLETE METABOLIC PANEL WITH GFR   Low HDL (under 40)    Check lipid panel; weight loss should help raise HDL      Relevant Orders   Lipid panel   Alcoholism in recovery ([place])    Craving sweets; off of alcohol though, encouraged recovery work       Other Visit Diagnoses    Screening for breast cancer       Relevant Orders   MM Digital Screening       Follow up plan: Return in about 6 months (around 04/15/2017) for twenty minute follow-up with fasting labs.  An after-visit  summary was printed and given to the patient at [place].  Please see the patient instructions which may contain other information and recommendations beyond what is mentioned above in the assessment and plan.  Meds ordered this encounter  Medications  . HYDROcodone-acetaminophen (NORCO/VICODIN) 5-325 MG tablet    Sig: 1/2-1 po qday prn    Orders Placed This Encounter  Procedures  . MM Digital Screening  . T3, free  . T4, free  . CBC with Differential/Platelet  . COMPLETE METABOLIC PANEL WITH GFR  . Hemoglobin A1c  . TSH  . Lipid panel  . VITAMIN D 25 Hydroxy (Vit-D Deficiency, Fractures)

## 2016-10-14 NOTE — Assessment & Plan Note (Signed)
Well controlled 

## 2016-10-14 NOTE — Assessment & Plan Note (Signed)
Will recheck A1c and glucose with her recent weight gain and sugar cravings

## 2016-10-14 NOTE — Patient Instructions (Signed)
Please check to see if Susan [name] is covered under your plan Check out the information at familydoctor.org entitled "Nutrition for Weight Loss: What You Need to Know about Fad Diets" Try to lose between 1-2 pounds per week by taking in fewer calories and burning off more calories You can succeed by limiting portions, limiting foods dense in calories and fat, becoming more active, and drinking 8 glasses of water a day (64 ounces) Don't skip meals, especially breakfast, as skipping meals may alter your metabolism Do not use over-the-counter weight loss pills or gimmicks that claim rapid weight loss A healthy BMI (or body mass index) is between 18.5 and 24.9 You can calculate your ideal BMI at the [place] website [employer]fr We'll get labs today and contact you

## 2016-10-14 NOTE — Assessment & Plan Note (Signed)
Check level and supplement if needed 

## 2016-10-14 NOTE — Assessment & Plan Note (Signed)
Patient will check with insurance to see if Saxenda covered

## 2016-10-14 NOTE — Assessment & Plan Note (Signed)
Check labs 

## 2016-10-15 LAB — HEMOGLOBIN A1C
Hgb A1c MFr Bld: 5.6 %
Mean Plasma Glucose: 114 mg/dL

## 2016-10-15 LAB — VITAMIN D 25 HYDROXY (VIT D DEFICIENCY, FRACTURES): Vit D, 25-Hydroxy: 18 ng/mL — ABNORMAL LOW (ref 30–100)

## 2016-10-19 ENCOUNTER — Encounter: Payer: Self-pay | Admitting: Family Medicine

## 2016-10-19 ENCOUNTER — Ambulatory Visit (INDEPENDENT_AMBULATORY_CARE_PROVIDER_SITE_OTHER): Payer: Medicare Other | Admitting: Family Medicine

## 2016-10-19 VITALS — BP 120/82 | HR 82 | Temp 97.5°F | Resp 16 | Wt 224.8 lb

## 2016-10-19 DIAGNOSIS — E559 Vitamin D deficiency, unspecified: Secondary | ICD-10-CM

## 2016-10-19 DIAGNOSIS — M7660 Achilles tendinitis, unspecified leg: Secondary | ICD-10-CM | POA: Diagnosis not present

## 2016-10-19 MED ORDER — ECONAZOLE NITRATE 1 % EX CREA: 1.0000 "application " | TOPICAL_CREAM | Freq: Two times a day (BID) | CUTANEOUS | 2 refills | Status: DC | PRN

## 2016-10-19 NOTE — Progress Notes (Signed)
BP 120/82   Pulse 82   Temp 97.5 F (36.4 C) (Oral)   Resp 16   Wt 224 lb 12.8 oz (102 kg)   SpO2 95%   BMI 41.12 kg/m    Subjective:    Patient ID: Susan [name], female    DOB: [birth year], 56 y.o.   MRN: [id number]  HPI: CECILLIA [name] is a 56 y.o. female  Chief Complaint  Patient presents with  . Foot Pain    knot on right heel  . Medication Refill    on cream  . prior auth    saxenda    HPI Patient is here for an acute visit Main reason for visit is painful nodule on the back of the left ankle 3 weeks duration; gets worse through the day Has seen Dr. Vickki Muff (podiatrist) at [facility]; she would like a referral there  Needs refill of econazole; rash under the breasts and inguinal crease; summer is worse  Needs prior authorization form for Saxenda, but lost 2 pounds on her own since [place] organic vegetables, trying to eat healthy  Reviewed last labs; had vitamin D deficiency, will start 50k iu weekly x 4 weeks, then once a month  High cholesterol; will try to lose weight and eat better  Depression screen [facility] 2/9 10/19/2016 10/14/2016 07/15/2016 04/12/2016 12/03/2015  Decreased Interest 0 0 3 0 0  Down, Depressed, Hopeless 1 0 3 0 1  PHQ - 2 Score 1 0 6 0 1  Altered sleeping - - 3 - -  Tired, decreased energy - - 2 - -  Change in appetite - - 1 - -  Feeling bad or failure about yourself  - - 1 - -  Trouble concentrating - - 2 - -  Moving slowly or fidgety/restless - - 0 - -  Suicidal thoughts - - 0 - -  PHQ-9 Score - - 15 - -  Difficult doing work/chores - - Somewhat difficult - -    Relevant past medical, surgical, family and social history reviewed Past Medical History:  Diagnosis Date  . Anxiety   . Arthritis    rheumatoid arthritis  . Asthma   . Bipolar disorder ([place])   . Degenerative disc disease at L5-S1 level 09/28/2016   See ortho note May 2018  . Depression    bipolar, hx of suicide attempt  . Dyspnea    with exertion  .  GERD (gastroesophageal reflux disease)   . H/O suicide attempt    slit wrists  . History of alcohol abuse   . History of diverticulitis 2013  . History of hepatitis C    HEP "C"--three years ago  . History of MRSA infection 2013  . Hypertension   . Hypothyroidism   . 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 Multinodular thyroid   . OSA (obstructive sleep apnea)    managed by Dr. Manuella Ghazi  . Osteoporosis   . Post-traumatic osteoarthritis of right knee 09/24/2015  . Sleep apnea     Dx 3 years ago. Use C-PAP  . Thyroid disease    Goiter  . Vitamin B12 deficiency   . Vitamin D deficiency disease    Past Surgical History:  Procedure Laterality Date  . BILATERAL SALPINGOOPHORECTOMY     due to abnormal mass  . BREAST SURGERY Left 20 yrs ago  . CESAREAN SECTION    . HERNIA REPAIR  06/2011, July 2014   Ventral wall repair with Physiomesh  . TONSILLECTOMY    .  TOTAL KNEE ARTHROPLASTY Right 05/10/2016   Procedure: TOTAL KNEE ARTHROPLASTY;  Surgeon: Corky Mull, MD;  Location: [facility] ORS;  Service: Orthopedics;  Laterality: Right;  . TUBAL LIGATION     Social History   Social History  . Marital status: Widowed    Spouse name: N/A  . Number of children: N/A  . Years of education: N/A   Occupational History  . Not on file.   Social History Main Topics  . Smoking status: Current Every Day Smoker    Packs/day: 0.50    Years: 30.00    Types: Cigarettes  . Smokeless tobacco: Never Used  . Alcohol use No     Comment: 16 months sober  . 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 Sexual activity: Yes   Other Topics Concern  . Not on file   Social History Narrative  . No narrative on file    Interim medical history since last visit reviewed. Allergies and medications reviewed  Review of Systems Per HPI unless specifically indicated above     Objective:    BP 120/82   Pulse 82   Temp 97.5 F (36.4 C) (Oral)   Resp 16   Wt 224 lb 12.8 oz (102 kg)   SpO2  95%   BMI 41.12 kg/m   Wt Readings from Last 3 Encounters:  10/19/16 224 lb 12.8 oz (102 kg)  10/14/16 223 lb 14.4 oz (101.6 kg)  07/15/16 208 lb (94.3 kg)    Physical Exam  Constitutional: She appears well-developed and well-nourished.  HENT:  Mouth/Throat: Mucous membranes are normal.  Eyes: EOM are normal. No scleral icterus.  Cardiovascular: Normal rate and regular rhythm.   Pulses:      Dorsalis pedis pulses are 1+ on the left side.  Pulmonary/Chest: Effort normal and breath sounds normal.  Musculoskeletal:       Left ankle: She exhibits decreased range of motion, swelling and deformity (nodules on the posterior aspect of the LEFT achilles).  Neurological:  Sensation intact distal left foot  Psychiatric: She has a normal mood and affect. Her behavior is normal.    Results for orders placed or performed in visit on 10/14/16  T3, free  Result Value Ref Range   T3, Free 3.0 2.3 - 4.2 pg/mL  T4, free  Result Value Ref Range   Free T4 0.8 0.8 - 1.8 ng/dL  CBC with Differential/Platelet  Result Value Ref Range   WBC 7.8 3.8 - 10.8 K/uL   RBC 4.36 3.80 - 5.10 MIL/uL   Hemoglobin 13.4 11.7 - 15.5 g/dL   HCT 41.1 35.0 - 45.0 %   MCV 94.3 80.0 - 100.0 fL   MCH 30.7 27.0 - 33.0 pg   MCHC 32.6 32.0 - 36.0 g/dL   RDW 13.0 11.0 - 15.0 %   Platelets 237 140 - 400 K/uL   MPV 8.5 7.5 - 12.5 fL   Neutro Abs 4,758 1,500 - 7,800 cells/uL   Lymphs Abs 2,496 850 - 3,900 cells/uL   Monocytes Absolute 390 200 - 950 cells/uL   Eosinophils Absolute 156 15 - 500 cells/uL   Basophils Absolute 0 0 - 200 cells/uL   Neutrophils Relative % 61 %   Lymphocytes Relative 32 %   Monocytes Relative 5 %   Eosinophils Relative 2 %   Basophils Relative 0 %   Smear Review Criteria for review not met   COMPLETE METABOLIC PANEL WITH GFR  Result Value Ref Range   Sodium 140 135 - 146 mmol/L   Potassium 4.2 3.5 - 5.3 mmol/L   Chloride 107 98 - 110 mmol/L   CO2 26 20 - 31 mmol/L   Glucose, Bld 93  65 - 99 mg/dL   BUN 9 7 - 25 mg/dL   Creat 0.72 0.50 - 1.05 mg/dL   Total Bilirubin 0.5 0.2 - 1.2 mg/dL   Alkaline Phosphatase 69 33 - 130 U/L   AST 16 10 - 35 U/L   ALT 16 6 - 29 U/L   Total Protein 6.5 6.1 - 8.1 g/dL   Albumin 4.1 3.6 - 5.1 g/dL   Calcium 9.2 8.6 - 10.4 mg/dL   GFR, Est African American >89 >=60 mL/min   GFR, Est Non African American >89 >=60 mL/min  Hemoglobin A1c  Result Value Ref Range   Hgb A1c MFr Bld 5.6 <5.7 %   Mean Plasma Glucose 114 mg/dL  TSH  Result Value Ref Range   TSH 0.94 mIU/L  Lipid panel  Result Value Ref Range   Cholesterol 214 (H) <200 mg/dL   Triglycerides 135 <150 mg/dL   HDL 38 (L) >50 mg/dL   Total CHOL/HDL Ratio 5.6 (H) <5.0 Ratio   VLDL 27 <30 mg/dL   LDL Cholesterol 149 (H) <100 mg/dL  VITAMIN D 25 Hydroxy (Vit-D Deficiency, Fractures)  Result Value Ref Range   Vit D, 25-Hydroxy 18 (L) 30 - 100 ng/mL      Assessment & Plan:   Problem List Items Addressed This Visit      Other   Vitamin D deficiency    Start the Rx vitamin D once week for 4 weeks, then once a month      Morbid obesity ([place])    Will try to get Saxenda approval; will ask my staff to start on prior auth; encouraged her to eat healthy, keep up efforts       Other Visit Diagnoses    Pain in Achilles tendon    -  Primary   Relevant Orders   Ambulatory referral to Podiatry       Follow up plan: No Follow-up on file.  An after-visit summary was printed and given to the patient at [place].  Please see the patient instructions which may contain other information and recommendations beyond what is mentioned above in the assessment and plan.  Meds ordered this encounter  Medications  . econazole nitrate 1 % cream    Sig: Apply 1 application topically 2 (two) times daily as needed (for yeast or rash under breasts).    Dispense:  30 g    Refill:  2    Orders Placed This Encounter  Procedures  . Ambulatory referral to Podiatry

## 2016-10-19 NOTE — Patient Instructions (Signed)
Keep up the good work with weight loss We'll have you see the podiatrist Use the cream as needed; air areas out well and keep ventilated and dry

## 2016-10-19 NOTE — Assessment & Plan Note (Signed)
Start the Rx vitamin D once week for 4 weeks, then once a month

## 2016-10-19 NOTE — Assessment & Plan Note (Signed)
Will try to get Saxenda approval; will ask my staff to start on prior auth; encouraged her to eat healthy, keep up efforts

## 2016-11-03 ENCOUNTER — Other Ambulatory Visit: Payer: Self-pay | Admitting: Family Medicine

## 2016-11-03 DIAGNOSIS — M79672 Pain in left foot: Secondary | ICD-10-CM | POA: Diagnosis not present

## 2016-11-03 DIAGNOSIS — M7732 Calcaneal spur, left foot: Secondary | ICD-10-CM | POA: Diagnosis not present

## 2016-11-03 DIAGNOSIS — M7662 Achilles tendinitis, left leg: Secondary | ICD-10-CM | POA: Diagnosis not present

## 2016-11-03 DIAGNOSIS — I10 Essential (primary) hypertension: Secondary | ICD-10-CM

## 2016-11-03 DIAGNOSIS — Z6841 Body Mass Index (BMI) 40.0 and over, adult: Secondary | ICD-10-CM | POA: Diagnosis not present

## 2016-11-04 NOTE — Telephone Encounter (Signed)
Labs and BP reviewed from June visit with PCP Dr. Sanda Klein. Refills provided. Pt has 45mo follow up scheduled for December.

## 2016-11-17 ENCOUNTER — Other Ambulatory Visit: Payer: Self-pay | Admitting: Family Medicine

## 2016-11-17 MED ORDER — CLOTRIMAZOLE 1 % EX CREA: 1.0000 "application " | TOPICAL_CREAM | Freq: Two times a day (BID) | CUTANEOUS | 0 refills | Status: DC

## 2016-11-17 NOTE — Progress Notes (Signed)
Insurance would not pay for econazole; changed Rx

## 2016-11-23 DIAGNOSIS — F411 Generalized anxiety disorder: Secondary | ICD-10-CM | POA: Diagnosis not present

## 2016-11-30 ENCOUNTER — Other Ambulatory Visit: Payer: Self-pay | Admitting: Family Medicine

## 2016-11-30 MED ORDER — ATENOLOL 25 MG PO TABS: 25.0000 mg | ORAL_TABLET | Freq: Every day | ORAL | 3 refills | Status: DC

## 2016-11-30 MED ORDER — AMLODIPINE BESYLATE 10 MG PO TABS: 10.0000 mg | ORAL_TABLET | Freq: Every day | ORAL | 3 refills | Status: DC

## 2016-11-30 NOTE — Progress Notes (Signed)
90 day requested She quit ropinirole

## 2016-12-01 ENCOUNTER — Ambulatory Visit (INDEPENDENT_AMBULATORY_CARE_PROVIDER_SITE_OTHER): Payer: Medicare Other | Admitting: Family Medicine

## 2016-12-01 ENCOUNTER — Encounter: Payer: Self-pay | Admitting: Family Medicine

## 2016-12-01 VITALS — BP 120/64 | HR 94 | Temp 97.8°F | Resp 16 | Ht 62.25 in | Wt 227.0 lb

## 2016-12-01 DIAGNOSIS — M545 Low back pain: Secondary | ICD-10-CM

## 2016-12-01 DIAGNOSIS — I10 Essential (primary) hypertension: Secondary | ICD-10-CM | POA: Diagnosis not present

## 2016-12-01 DIAGNOSIS — G8929 Other chronic pain: Secondary | ICD-10-CM

## 2016-12-01 DIAGNOSIS — Z114 Encounter for screening for human immunodeficiency virus [HIV]: Secondary | ICD-10-CM | POA: Diagnosis not present

## 2016-12-01 DIAGNOSIS — F17200 Nicotine dependence, unspecified, uncomplicated: Secondary | ICD-10-CM

## 2016-12-01 DIAGNOSIS — R05 Cough: Secondary | ICD-10-CM

## 2016-12-01 DIAGNOSIS — R059 Cough, unspecified: Secondary | ICD-10-CM

## 2016-12-01 MED ORDER — TIOTROPIUM BROMIDE MONOHYDRATE 2.5 MCG/ACT IN AERS: 2.0000 | INHALATION_SPRAY | Freq: Every day | RESPIRATORY_TRACT | 5 refills | Status: DC

## 2016-12-01 MED ORDER — ALBUTEROL SULFATE HFA 108 (90 BASE) MCG/ACT IN AERS: 2.0000 | INHALATION_SPRAY | RESPIRATORY_TRACT | 2 refills | Status: DC | PRN

## 2016-12-01 NOTE — Progress Notes (Signed)
BP 120/64   Pulse 94   Temp 97.8 F (36.6 C) (Oral)   Resp 16   Ht 5' 2.25" (1.581 m)   Wt 227 lb (103 kg)   SpO2 95%   BMI 41.19 kg/m    Subjective:    Patient ID: Susan [name], female    DOB: 03-Jan-1961, 56 y.o.   MRN: [id number]  HPI: Susan [name] is a 56 y.o. female  Chief Complaint  Patient presents with  . Hypertension    See about lowering her Bp med strengh  . Referral    baratric   . Nicotine Dependence    discuss chantex     HPI Patient is here for follow-up  Her blood pressure is getting lower and she would like to keep the atenolol at night She checks her BP at home and she gets 124-580 systolic, diastolic not over 80 at home She feels tired and draggy, but able to do all chores  She is on buspar, taking it for two years; taking for anxiety; she thinks she has better coping skills; refuses to not get upset Seeing her psychiatrist; now getting 15 clonazepam a month; doing okay, no seizure activity  Seeing Dr. Sharlet Salina for back and knee pain; on norco is pain only an 8 out of 10 or above; does not want surgery  She wants to talk about bariatric surgery; she eats fairly well; weight is not coming off and she's actually putting weight on; she has not done any formal weight loss programs; no idea how many calories she eats a day She saw the nutritionist when she was prediabetic She would like a a set diet to follow Eating too late she says  Smoking but ready to quit; wants to talk about Chantix; doesn't smoke in the house; daughter is going to quit with her  Depression screen [facility] 2/9 12/01/2016 10/19/2016 10/14/2016 07/15/2016 04/12/2016  Decreased Interest - 0 0 3 0  Down, Depressed, Hopeless 1 1 0 3 0  PHQ - 2 Score 1 1 0 6 0  Altered sleeping - - - 3 -  Tired, decreased energy - - - 2 -  Change in appetite - - - 1 -  Feeling bad or failure about yourself  - - - 1 -  Trouble concentrating - - - 2 -  Moving slowly or fidgety/restless - - - 0 -    Suicidal thoughts - - - 0 -  PHQ-9 Score - - - 15 -  Difficult doing work/chores - - - Somewhat difficult -   Relevant past medical, surgical, family and social history reviewed Past Medical History:  Diagnosis Date  . Anxiety   . Arthritis    rheumatoid arthritis  . Asthma   . Bipolar disorder ([place])   . Degenerative disc disease at L5-S1 level 09/28/2016   See ortho note May 2018  . Depression    bipolar, hx of suicide attempt  . Dyspnea    with exertion  . GERD (gastroesophageal reflux disease)   . H/O suicide attempt    slit wrists  . History of alcohol abuse   . History of diverticulitis 2013  . History of hepatitis C    HEP "C"--three years ago  . History of MRSA infection 2013  . Hypertension   . Hypothyroidism   . 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 Multinodular thyroid   . OSA (obstructive sleep apnea)    managed by Dr. Manuella Ghazi  . Osteoporosis   .  Post-traumatic osteoarthritis of right knee 09/24/2015  . Sleep apnea     Dx 3 years ago. Use C-PAP  . Thyroid disease    Goiter  . Vitamin B12 deficiency   . Vitamin D deficiency disease    Past Surgical History:  Procedure Laterality Date  . BILATERAL SALPINGOOPHORECTOMY     due to abnormal mass  . BREAST SURGERY Left 20 yrs ago  . CESAREAN SECTION    . HERNIA REPAIR  06/2011, July 2014   Ventral wall repair with Physiomesh  . TONSILLECTOMY    . TOTAL KNEE ARTHROPLASTY Right 05/10/2016   Procedure: TOTAL KNEE ARTHROPLASTY;  Surgeon: Corky Mull, MD;  Location: [facility] ORS;  Service: Orthopedics;  Laterality: Right;  . TUBAL LIGATION     Family History  Problem Relation Age of Onset  . Alcohol abuse Father   . Heart attack Father   . Arthritis Mother   . Asthma Mother   . Mental illness Mother   . Thyroid disease Mother   . COPD Mother   . Heart disease Mother   . Congestive Heart Failure Mother   . Arthritis Brother   . Mental illness Brother   . Cancer Brother        non-hodkins lymphoma  . Alcohol abuse Sister    . Drug abuse Sister   . Mental illness Sister   . Mental illness Sister   . Fibromyalgia Sister   . Obesity Sister   . Pneumonia Sister   . Mental illness Sister   . Alcohol abuse Sister   . Drug abuse Sister   . Diabetes Neg Hx   . Stroke Neg Hx    Social History   Social History  . Marital status: Widowed    Spouse name: N/A  . Number of children: N/A  . Years of education: N/A   Occupational History  . Not on file.   Social History Main Topics  . Smoking status: Current Every Day Smoker    Packs/day: 0.50    Years: 30.00    Types: Cigarettes  . Smokeless tobacco: Never Used  . Alcohol use No     Comment: 2 years sober  . 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 Sexual activity: Not Currently   Other Topics Concern  . Not on file   Social History Narrative  . No narrative on file    Interim medical history since last visit reviewed. Allergies and medications reviewed  Review of Systems Per HPI unless specifically indicated above     Objective:    BP 120/64   Pulse 94   Temp 97.8 F (36.6 C) (Oral)   Resp 16   Ht 5' 2.25" (1.581 m)   Wt 227 lb (103 kg)   SpO2 95%   BMI 41.19 kg/m   Wt Readings from Last 3 Encounters:  12/01/16 227 lb (103 kg)  10/19/16 224 lb 12.8 oz (102 kg)  10/14/16 223 lb 14.4 oz (101.6 kg)    Physical Exam  Constitutional: She appears well-developed and well-nourished. No distress.  HENT:  Head: Normocephalic and atraumatic.  Eyes: EOM are normal. No scleral icterus.  Neck: No thyromegaly present.  Cardiovascular: Normal rate, regular rhythm and normal heart sounds.   No murmur heard. Pulmonary/Chest: Effort normal and breath sounds normal. No respiratory distress. She has no wheezes.  Abdominal: Soft. Bowel sounds are normal. She exhibits no distension.  Musculoskeletal: Normal range  of motion. She exhibits no edema.  Neurological: She is alert. She exhibits normal muscle tone.   Skin: Skin is warm and dry. She is not diaphoretic. No pallor.  Psychiatric: She has a normal mood and affect. Her behavior is normal. Judgment and thought content normal.      Assessment & Plan:   Problem List Items Addressed This Visit      Cardiovascular and Mediastinum   Hypertension - Primary    Adjust meds; monitor      Relevant Medications   amLODipine (NORVASC) 10 MG tablet     Other   Tobacco dependence    Discussed different forms of smoking cessation with the patient, and I encouraged cessation. Patient requests trial of Chantix.  I discussed common side effects including but not limited to nausea/vomiting, vivid dreams, behaviour changes, depression, suicidal ideation; also explained possible increased cardiovascular risk. After discussion, patient opts to try quitting with Chantix. The 1-800-QUIT-NOW number given in patient instructions. Patient was encouraged to choose a quit date about 1 week after starting medicine. Order chest CT for screening      Relevant Orders   CT CHEST LUNG CA SCREEN LOW DOSE W/O CM   Morbid obesity ([place])    Refer to bariatric clinic for management; see AVS      Relevant Orders   Amb Referral to Bariatric Surgery   Chronic back pain    Seeing Dr. Sharlet Salina      Relevant Medications   traMADol (ULTRAM) 50 MG tablet    Other Visit Diagnoses    Screening for HIV (human immunodeficiency virus)       Relevant Orders   HIV antibody (with reflex)   Cough       Relevant Orders   DG Chest 2 View       Follow up plan: Return in about 3 months (around 02/17/2017) for fasting labs only.  An after-visit summary was printed and given to the patient at [place].  Please see the patient instructions which may contain other information and recommendations beyond what is mentioned above in the assessment and plan.  Meds ordered this encounter  Medications  . traMADol (ULTRAM) 50 MG tablet    Sig: Take 2 tablets by mouth at bedtime.  Marland Kitchen  amLODipine (NORVASC) 10 MG tablet    Sig: Take 0.5 tablets (5 mg total) by mouth daily.    Dispense:  90 tablet    Refill:  3    Maximum Refills Reached  . busPIRone (BUSPAR) 15 MG tablet    Sig: Take 1/2 pill in the morning and 1 pill in the evening for 1 week, then 1/2 pill twice a day for 1 week, then stop    Maximum Refills Reached  . DISCONTD: Tiotropium Bromide Monohydrate (SPIRIVA RESPIMAT) 2.5 MCG/ACT AERS    Sig: Inhale 2 puffs into the lungs daily.    Dispense:  1 Inhaler    Refill:  5  . albuterol (VENTOLIN HFA) 108 (90 Base) MCG/ACT inhaler    Sig: Inhale 2 puffs into the lungs every 4 (four) hours as needed for wheezing or shortness of breath.    Dispense:  18 g    Refill:  2    Prescription Expired    Orders Placed This Encounter  Procedures  . CT CHEST LUNG CA SCREEN LOW DOSE W/O CM  . DG Chest 2 View  . HIV antibody (with reflex)  . Amb Referral to Bariatric Surgery

## 2016-12-01 NOTE — Assessment & Plan Note (Addendum)
Discussed different forms of smoking cessation with the patient, and I encouraged cessation. Patient requests trial of Chantix.  I discussed common side effects including but not limited to nausea/vomiting, vivid dreams, behaviour changes, depression, suicidal ideation; also explained possible increased cardiovascular risk. After discussion, patient opts to try quitting with Chantix. The 1-800-QUIT-NOW number given in patient instructions. Patient was encouraged to choose a quit date about 1 week after starting medicine. Order chest CT for screening

## 2016-12-01 NOTE — Assessment & Plan Note (Signed)
Refer to bariatric clinic for management; see AVS

## 2016-12-01 NOTE — Patient Instructions (Addendum)
For one week, decrease the amlodipine from 10 mg daily to 5 mg daily Monitor your blood pressure and okay to stop completely if BP controlled Let me know if you stay at 5 mg daily  I do encourage you to quit smoking Call [phone] to sign up for smoking cessation classes You can call 1-800-QUIT-NOW to talk with a smoking cessation coach  Please have chest xray done tomorrow Start spiriva They'll contact you about the chest CT   Steps to Quit Smoking Smoking tobacco can be harmful to your health and can affect almost every organ in your body. Smoking puts you, and those around you, at risk for developing many serious chronic diseases. Quitting smoking is difficult, but it is one of the best things that you can do for your health. It is never too late to quit. What are the benefits of quitting smoking? When you quit smoking, you lower your risk of developing serious diseases and conditions, such as:  Lung cancer or lung disease, such as COPD.  Heart disease.  Stroke.  Heart attack.  Infertility.  Osteoporosis and bone fractures.  Additionally, symptoms such as coughing, wheezing, and shortness of breath may get better when you quit. You may also find that you get sick less often because your body is stronger at fighting off colds and infections. If you are pregnant, quitting smoking can help to reduce your chances of having a baby of low birth weight. How do I get ready to quit? When you decide to quit smoking, create a plan to make sure that you are successful. Before you quit:  Pick a date to quit. Set a date within the next two weeks to give you time to prepare.  Write down the reasons why you are quitting. Keep this list in places where you will see it often, such as on your bathroom mirror or in your car or wallet.  Identify the people, places, things, and activities that make you want to smoke (triggers) and avoid them. Make sure to take these actions: ? Throw away all  cigarettes at home, at work, and in your car. ? Throw away smoking accessories, such as Scientist, research (medical). ? Clean your car and make sure to empty the ashtray. ? Clean your home, including curtains and carpets.  Tell your family, friends, and coworkers that you are quitting. Support from your loved ones can make quitting easier.  Talk with your health care provider about your options for quitting smoking.  Find out what treatment options are covered by your health insurance.  What strategies can I use to quit smoking? Talk with your healthcare provider about different strategies to quit smoking. Some strategies include:  Quitting smoking altogether instead of gradually lessening how much you smoke over a period of time. Research shows that quitting "cold [place]" is more successful than gradually quitting.  Attending in-person counseling to help you build problem-solving skills. You are more likely to have success in quitting if you attend several counseling sessions. Even short sessions of 10 minutes can be effective.  Finding resources and support systems that can help you to quit smoking and remain smoke-free after you quit. These resources are most helpful when you use them often. They can include: ? Online chats with a Social worker. ? Telephone quitlines. ? Careers information officer. ? Support groups or group counseling. ? Text messaging programs. ? Mobile phone applications.  Taking medicines to help you quit smoking. (If you are pregnant or breastfeeding, talk with  your health care provider first.) Some medicines contain nicotine and some do not. Both types of medicines help with cravings, but the medicines that include nicotine help to relieve withdrawal symptoms. Your health care provider may recommend: ? Nicotine patches, gum, or lozenges. ? Nicotine inhalers or sprays. ? Non-nicotine medicine that is taken by mouth.  Talk with your health care provider about combining  strategies, such as taking medicines while you are also receiving in-person counseling. Using these two strategies together makes you more likely to succeed in quitting than if you used either strategy on its own. If you are pregnant or breastfeeding, talk with your health care provider about finding counseling or other support strategies to quit smoking. Do not take medicine to help you quit smoking unless told to do so by your health care provider. What things can I do to make it easier to quit? Quitting smoking might feel overwhelming at first, but there is a lot that you can do to make it easier. Take these important actions:  Reach out to your family and friends and ask that they support and encourage you during this time. Call telephone quitlines, reach out to support groups, or work with a counselor for support.  Ask people who smoke to avoid smoking around you.  Avoid places that trigger you to smoke, such as bars, parties, or smoke-break areas at work.  Spend time around people who do not smoke.  Lessen stress in your life, because stress can be a smoking trigger for some people. To lessen stress, try: ? Exercising regularly. ? Deep-breathing exercises. ? Yoga. ? Meditating. ? Performing a body scan. This involves closing your eyes, scanning your body from head to toe, and noticing which parts of your body are particularly tense. Purposefully relax the muscles in those areas.  Download or purchase mobile phone or tablet apps (applications) that can help you stick to your quit plan by providing reminders, tips, and encouragement. There are many free apps, such as QuitGuide from the [employer] Office manager for Disease Control and Prevention). You can find other support for quitting smoking (smoking cessation) through smokefree.gov and other websites.  How will I feel when I quit smoking? Within the first 24 hours of quitting smoking, you may start to feel some withdrawal symptoms. These symptoms  are usually most noticeable 2-3 days after quitting, but they usually do not last beyond 2-3 weeks. Changes or symptoms that you might experience include:  Mood swings.  Restlessness, anxiety, or irritation.  Difficulty concentrating.  Dizziness.  Strong cravings for sugary foods in addition to nicotine.  Mild weight gain.  Constipation.  Nausea.  Coughing or a sore throat.  Changes in how your medicines work in your body.  A depressed mood.  Difficulty sleeping (insomnia).  After the first 2-3 weeks of quitting, you may start to notice more positive results, such as:  Improved sense of smell and taste.  Decreased coughing and sore throat.  Slower heart rate.  Lower blood pressure.  Clearer skin.  The ability to breathe more easily.  Fewer sick days.  Quitting smoking is very challenging for most people. Do not get discouraged if you are not successful the first time. Some people need to make many attempts to quit before they achieve long-term success. Do your best to stick to your quit plan, and talk with your health care provider if you have any questions or concerns. This information is not intended to replace advice given to you by your health  care provider. Make sure you discuss any questions you have with your health care provider. Document Released: 04/19/2001 Document Revised: 12/22/2015 Document Reviewed: 09/09/2014 Elsevier Interactive Patient Education  2017 [employer].

## 2016-12-02 ENCOUNTER — Other Ambulatory Visit: Payer: Self-pay | Admitting: Family Medicine

## 2016-12-02 MED ORDER — UMECLIDINIUM BROMIDE 62.5 MCG/INH IN AEPB: 1.0000 | INHALATION_SPRAY | Freq: Every day | RESPIRATORY_TRACT | 11 refills | Status: DC

## 2016-12-02 NOTE — Progress Notes (Signed)
spiriva not covered; incruse covered; new Rx sent

## 2016-12-05 ENCOUNTER — Telehealth: Payer: Self-pay

## 2016-12-05 DIAGNOSIS — M7732 Calcaneal spur, left foot: Secondary | ICD-10-CM | POA: Diagnosis not present

## 2016-12-05 DIAGNOSIS — M7662 Achilles tendinitis, left leg: Secondary | ICD-10-CM | POA: Diagnosis not present

## 2016-12-05 DIAGNOSIS — F17200 Nicotine dependence, unspecified, uncomplicated: Secondary | ICD-10-CM

## 2016-12-05 MED ORDER — VARENICLINE TARTRATE 0.5 MG X 11 & 1 MG X 42 PO MISC: ORAL | 0 refills | Status: DC

## 2016-12-05 NOTE — Telephone Encounter (Signed)
My sincerest apologies; I called pt personally to apologizw Rx ready to fax

## 2016-12-05 NOTE — Telephone Encounter (Signed)
Pharmacy tech called on the behalf of the pt asking about the RX for Chantix.

## 2016-12-08 ENCOUNTER — Telehealth: Payer: Self-pay | Admitting: *Deleted

## 2016-12-08 NOTE — Telephone Encounter (Signed)
Received referral for low dose lung cancer screening CT scan. Message left at phone number listed in EMR for patient to call me back to facilitate scheduling scan.  

## 2016-12-12 ENCOUNTER — Other Ambulatory Visit: Payer: Self-pay | Admitting: Physical Medicine and Rehabilitation

## 2016-12-12 DIAGNOSIS — M5416 Radiculopathy, lumbar region: Secondary | ICD-10-CM

## 2016-12-12 DIAGNOSIS — M5136 Other intervertebral disc degeneration, lumbar region: Secondary | ICD-10-CM | POA: Diagnosis not present

## 2016-12-12 DIAGNOSIS — M48062 Spinal stenosis, lumbar region with neurogenic claudication: Secondary | ICD-10-CM | POA: Diagnosis not present

## 2016-12-12 NOTE — Assessment & Plan Note (Signed)
Seeing Dr Chasnis.   

## 2016-12-12 NOTE — Assessment & Plan Note (Signed)
Adjust meds; monitor

## 2016-12-13 ENCOUNTER — Telehealth: Payer: Self-pay | Admitting: *Deleted

## 2016-12-13 DIAGNOSIS — Z87891 Personal history of nicotine dependence: Secondary | ICD-10-CM

## 2016-12-13 DIAGNOSIS — Z122 Encounter for screening for malignant neoplasm of respiratory organs: Secondary | ICD-10-CM

## 2016-12-13 NOTE — Telephone Encounter (Signed)
Received referral for initial lung cancer screening scan. Contacted patient and obtained smoking history,(current, 41 pack year) as well as answering questions related to screening process. Patient denies signs of lung cancer such as weight loss or hemoptysis. Patient denies comorbidity that would prevent curative treatment if lung cancer were found. Patient is scheduled for shared decision making visit and CT scan on 12/21/16.

## 2016-12-15 ENCOUNTER — Encounter: Payer: Self-pay | Admitting: Radiology

## 2016-12-15 ENCOUNTER — Ambulatory Visit
Admission: RE | Admit: 2016-12-15 | Discharge: 2016-12-15 | Disposition: A | Discharge status: Home / Self Care | Payer: Medicare Other | Source: Ambulatory Visit | Attending: Physical Medicine and Rehabilitation | Admitting: Physical Medicine and Rehabilitation

## 2016-12-15 DIAGNOSIS — R609 Edema, unspecified: Secondary | ICD-10-CM | POA: Insufficient documentation

## 2016-12-15 DIAGNOSIS — M545 Low back pain: Secondary | ICD-10-CM | POA: Diagnosis not present

## 2016-12-15 DIAGNOSIS — M5416 Radiculopathy, lumbar region: Secondary | ICD-10-CM | POA: Diagnosis present

## 2016-12-15 DIAGNOSIS — M48061 Spinal stenosis, lumbar region without neurogenic claudication: Secondary | ICD-10-CM | POA: Diagnosis not present

## 2016-12-15 DIAGNOSIS — M5116 Intervertebral disc disorders with radiculopathy, lumbar region: Secondary | ICD-10-CM | POA: Insufficient documentation

## 2016-12-16 ENCOUNTER — Emergency Department
Admission: EM | Admit: 2016-12-16 | Discharge: 2016-12-16 | Disposition: A | Discharge status: Home / Self Care | Payer: Medicare Other | Attending: Emergency Medicine | Admitting: Emergency Medicine

## 2016-12-16 ENCOUNTER — Encounter: Payer: Self-pay | Admitting: Emergency Medicine

## 2016-12-16 ENCOUNTER — Emergency Department: Payer: Medicare Other

## 2016-12-16 DIAGNOSIS — R05 Cough: Secondary | ICD-10-CM | POA: Diagnosis not present

## 2016-12-16 DIAGNOSIS — Z79899 Other long term (current) drug therapy: Secondary | ICD-10-CM | POA: Diagnosis not present

## 2016-12-16 DIAGNOSIS — F17210 Nicotine dependence, cigarettes, uncomplicated: Secondary | ICD-10-CM | POA: Insufficient documentation

## 2016-12-16 DIAGNOSIS — R0602 Shortness of breath: Secondary | ICD-10-CM | POA: Diagnosis not present

## 2016-12-16 DIAGNOSIS — J42 Unspecified chronic bronchitis: Secondary | ICD-10-CM | POA: Diagnosis not present

## 2016-12-16 DIAGNOSIS — Z96651 Presence of right artificial knee joint: Secondary | ICD-10-CM | POA: Insufficient documentation

## 2016-12-16 DIAGNOSIS — I10 Essential (primary) hypertension: Secondary | ICD-10-CM | POA: Insufficient documentation

## 2016-12-16 DIAGNOSIS — R079 Chest pain, unspecified: Secondary | ICD-10-CM | POA: Diagnosis present

## 2016-12-16 LAB — BASIC METABOLIC PANEL
Anion gap: 9 (ref 5–15)
BUN: 7 mg/dL (ref 6–20)
CO2: 25 mmol/L (ref 22–32)
Calcium: 9 mg/dL (ref 8.9–10.3)
Chloride: 105 mmol/L (ref 101–111)
Creatinine, Ser: 0.86 mg/dL (ref 0.44–1.00)
GFR calc Af Amer: >60 mL/min (ref >60)
GFR calc non Af Amer: >60 mL/min (ref >60)
Glucose, Bld: 91 mg/dL (ref 65–99)
Potassium: 3.8 mmol/L (ref 3.5–5.1)
Sodium: 139 mmol/L (ref 135–145)

## 2016-12-16 LAB — CBC
HCT: 41.9 % (ref 35.0–47.0)
Hemoglobin: 14.3 g/dL (ref 12.0–16.0)
MCH: 32.1 pg (ref 26.0–34.0)
MCHC: 34.2 g/dL (ref 32.0–36.0)
MCV: 93.9 fL (ref 80.0–100.0)
Platelets: 247 10*3/uL (ref 150–440)
RBC: 4.46 MIL/uL (ref 3.80–5.20)
RDW: 12.7 % (ref 11.5–14.5)
WBC: 10.4 10*3/uL (ref 3.6–11.0)

## 2016-12-16 LAB — TROPONIN I: Troponin I: <0.03 ng/mL (ref <0.03)

## 2016-12-16 MED ORDER — BENZONATATE 100 MG PO CAPS: 100.0000 mg | ORAL_CAPSULE | Freq: Three times a day (TID) | ORAL | 0 refills | Status: DC | PRN

## 2016-12-16 MED ORDER — ALBUTEROL SULFATE HFA 108 (90 BASE) MCG/ACT IN AERS: INHALATION_SPRAY | RESPIRATORY_TRACT | 1 refills | Status: DC

## 2016-12-16 NOTE — Discharge Instructions (Signed)
As we discussed, your workup was reassuring, with normal labs, x-ray, and EKG.  We believe you are suffering from chronic bronchitis.  Try using the prescribed medications and follow up with your regular doctor as planned.    Return to the emergency department if you develop new or worsening symptoms that concern you.

## 2016-12-16 NOTE — ED Triage Notes (Signed)
Pt states that she has been having chest pain, shortness of breath, cough and congestion x 2 weeks. Pt states that she has felt worse over the past 2 days. Pt states that she does not have any energy. Pt was seen by her PCP but she did not give her any medication. Pt able to talk in complete sentences, breathing appears equal and unlabored.

## 2016-12-16 NOTE — ED Provider Notes (Signed)
[facility] Emergency Department Provider Note  ____________________________________________   First MD Initiated Contact with Patient 12/16/16 1954     (approximate)  I have reviewed the triage vital signs and the nursing notes.   HISTORY  Chief Complaint Chest Pain and Shortness of Breath    HPI Susan [name] is a 56 y.o. female with medical history as listed below who presents for evaluation of about 2 weeks of persistent chest discomfort, shortness of breath, cough, and congestion.  The chest discomfort is a mild fullness more notable when she coughs.  The shortness of breath is worse with exertion.  Overall she describes symptoms as moderate to severe.  Nothing particular makes his symptoms better.  She uses a CPAP at night and has been much morefatigued than usual over the last couple of weeks. Dr. Sanda Klein is her primary care provider and she haseen her recently but no medication was prescribed.  Dr. Sanda Klein has scheduledan outpatient CT scan of her chest in about a week.on of her ongoing symptoms.   Past Medical History:  Diagnosis Date  . Anxiety   . Arthritis    rheumatoid arthritis  . Asthma   . Bipolar disorder ([place])   . Degenerative disc disease at L5-S1 level 09/28/2016   See ortho note May 2018  . Depression    bipolar, hx of suicide attempt  . Dyspnea    with exertion  . GERD (gastroesophageal reflux disease)   . H/O suicide attempt    slit wrists  . History of alcohol abuse   . History of diverticulitis 2013  . History of hepatitis C    HEP "C"--three years ago  . History of MRSA infection 2013  . Hypertension   . Hypothyroidism   . 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 Multinodular thyroid   . OSA (obstructive sleep apnea)    managed by Dr. Manuella Ghazi  . Osteoporosis   . Post-traumatic osteoarthritis of right knee 09/24/2015  . Sleep apnea     Dx 3 years ago. Use C-PAP  . Thyroid disease    Goiter  . Vitamin B12 deficiency   . Vitamin D  deficiency disease     Patient Active Problem List   Diagnosis Date Noted  . Low HDL (under 40) 10/14/2016  . Degenerative disc disease at L5-S1 level 09/28/2016  . 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 Cracked skin on feet 01/19/2016  . Diastasis recti 12/18/2015  . GERD (gastroesophageal reflux disease) 12/17/2015  . Chronic back pain 10/09/2015  . Post-traumatic osteoarthritis of right knee 09/24/2015  . Morbid obesity ([place]) 08/06/2015  . Pain in right knee 08/03/2015  . Postmenopausal 07/08/2015  . Preventative health care 07/08/2015  . Cervical cancer screening 07/08/2015  . Alcoholism in recovery ([place]) 04/13/2015  . Impaired fasting glucose 04/10/2015  . Need for Streptococcus pneumoniae and influenza vaccination 04/09/2015  . Vitamin D deficiency 04/09/2015  . Vitamin B12 deficiency 04/09/2015  . Medication monitoring encounter 04/09/2015  . OSA on CPAP 04/09/2015  . Breast cancer screening 04/09/2015  . Subclinical hyperthyroidism 06/26/2014  . Toxic multinodular goiter 06/26/2014  . Hypertension 06/26/2014  . Hepatitis C 06/26/2014  . Tobacco dependence 06/26/2014  . Bipolar disorder ([place]) 06/26/2014  . Recurrent ventral hernia 11/08/2012    Past Surgical History:  Procedure Laterality Date  . BILATERAL SALPINGOOPHORECTOMY     due to abnormal mass  . BREAST SURGERY Left 20 yrs ago  . CESAREAN SECTION    .  HERNIA REPAIR  06/2011, July 2014   Ventral wall repair with Physiomesh  . TONSILLECTOMY    . TOTAL KNEE ARTHROPLASTY Right 05/10/2016   Procedure: TOTAL KNEE ARTHROPLASTY;  Surgeon: Corky Mull, MD;  Location: [facility] ORS;  Service: Orthopedics;  Laterality: Right;  . TUBAL LIGATION      Prior to Admission medications   Medication Sig Start Date End Date Taking? Authorizing Provider  naproxen (NAPROSYN) 500 MG tablet Take 500 mg by mouth 2 (two) times daily with a meal. 11/30/16  Yes [provider]  albuterol (PROVENTIL  HFA;VENTOLIN HFA) 108 (90 Base) MCG/ACT inhaler Inhale 2-4 puffs by mouth every 4 hours as needed for wheezing, cough, and/or shortness of breath 12/16/16   Hinda Kehr, MD  amLODipine (NORVASC) 10 MG tablet Take 0.5 tablets (5 mg total) by mouth daily. 12/01/16   Arnetha Courser, MD  atenolol (TENORMIN) 25 MG tablet Take 1 tablet (25 mg total) by mouth daily. 11/30/16   Lada, Satira Anis, MD  benzonatate (TESSALON PERLES) 100 MG capsule Take 1 capsule (100 mg total) by mouth 3 (three) times daily as needed for cough. 12/16/16   Hinda Kehr, MD  busPIRone (BUSPAR) 15 MG tablet Take 1/2 pill in the morning and 1 pill in the evening for 1 week, then 1/2 pill twice a day for 1 week, then stop 12/01/16   Arnetha Courser, MD  clonazePAM (KLONOPIN) 0.5 MG tablet Take 0.5 mg by mouth 3 (three) times daily as needed for anxiety.    [provider]  clotrimazole (LOTRIMIN) 1 % cream Apply 1 application topically 2 (two) times daily. Patient taking differently: Apply 1 application topically as needed.  11/17/16   Arnetha Courser, MD  cyclobenzaprine (FLEXERIL) 5 MG tablet Take 10 mg by mouth at bedtime.  06/04/15   [provider]  DULoxetine (CYMBALTA) 60 MG capsule Take 60 mg by mouth 2 (two) times daily.  07/22/15   [provider]  gabapentin (NEURONTIN) 300 MG capsule Take 600 mg by mouth 2 (two) times daily.     [provider]  HYDROcodone-acetaminophen (NORCO/VICODIN) 5-325 MG tablet One-half to one pill by mouth daily PRN ([facility]) 11/03/16   [provider]  losartan (COZAAR) 100 MG tablet TAKE 1 TABLET BY MOUTH DAILY. 11/04/16   Hubbard Hartshorn, FNP  traMADol (ULTRAM) 50 MG tablet Take 2 tablets by mouth at bedtime. 11/29/16   [provider]  umeclidinium bromide (INCRUSE ELLIPTA) 62.5 MCG/INH AEPB Inhale 1 puff into the lungs daily. 12/02/16   Lada, Satira Anis, MD  varenicline (CHANTIX STARTING MONTH PAK) 0.5 MG X 11 & 1 MG X 42 tablet Take one 0.5 mg tablet  by mouth once daily for 3 days, then increase to one 0.5 mg tablet twice daily for 4 days, then increase to one 1 mg tablet twice daily. 12/05/16   Arnetha Courser, MD    Allergies Hydrochlorothiazide and Lasix [furosemide]  Family History  Problem Relation Age of Onset  . Alcohol abuse Father   . Heart attack Father   . Arthritis Mother   . Asthma Mother   . Mental illness Mother   . Thyroid disease Mother   . COPD Mother   . Heart disease Mother   . Congestive Heart Failure Mother   . Arthritis Brother   . Mental illness Brother   . Cancer Brother        non-hodkins lymphoma  . Alcohol abuse Sister   .  Drug abuse Sister   . Mental illness Sister   . Mental illness Sister   . Fibromyalgia Sister   . Obesity Sister   . Pneumonia Sister   . Mental illness Sister   . Alcohol abuse Sister   . Drug abuse Sister   . Diabetes Neg Hx   . Stroke Neg Hx     Social History Social History  Substance Use Topics  . Smoking status: Current Every Day Smoker    Packs/day: 0.50    Years: 30.00    Types: Cigarettes  . Smokeless tobacco: Never Used  . Alcohol use No     Comment: 2 years sober    Review of Systems Constitutional: No fever/chills.  Fatigued recently. Eyes: No visual changes. ENT: No sore throat. Cardiovascular: Persistent chest pain for several weeks Respiratory: Persistent shortness of breath and cough for several weeks, worse with exertion Gastrointestinal: No abdominal pain.  No nausea, no vomiting.  No diarrhea.  No constipation. Genitourinary: Negative for dysuria. Musculoskeletal: Negative for neck pain.  Negative for back pain. Integumentary: Negative for rash. Neurological: Negative for headaches, focal weakness or numbness.   ____________________________________________   PHYSICAL EXAM:  VITAL SIGNS: ED Triage Vitals [12/16/16 1655]  Enc Vitals Group     BP (!) 142/73     Pulse Rate 92     Resp 20     Temp 97.6 F (36.4 C)     Temp Source  Oral     SpO2 97 %     Weight 103 kg (227 lb)     Height 1.575 m (5\' 2" )     Head Circumference      Peak Flow      Pain Score 7     Pain Loc      Pain Edu?      Excl. in [place]?     Constitutional: Alert and oriented. Well appearing and in no acute distress. Eyes: Conjunctivae are normal.  Head: Atraumatic. Nose: No congestion/rhinnorhea. Mouth/Throat: Mucous membranes are moist. Neck: No stridor.  No meningeal signs.   Cardiovascular: Normal rate, regular rhythm. Good peripheral circulation. Grossly normal heart sounds. Respiratory: Normal respiratory effort.  No retractions. Lungs CTAB.  Frequent thick sounding cough. Gastrointestinal: Soft and nontender. No distention.  Musculoskeletal: No lower extremity tenderness nor edema. No gross deformities of extremities. Neurologic:  Normal speech and language. No gross focal neurologic deficits are appreciated.  Skin:  Skin is warm, dry and intact. No rash noted. Psychiatric: Mood and affect are normal. Speech and behavior are normal.  ____________________________________________   LABS (all labs ordered are listed, but only abnormal results are displayed)  Labs Reviewed  BASIC METABOLIC PANEL  CBC  TROPONIN I   ____________________________________________  EKG  ED ECG REPORT I, Zala Degrasse, the attending physician, personally viewed and interpreted this ECG.  Date: 12/16/2016 EKG Time: 16:54 Rate: 90 Rhythm: normal sinus rhythm QRS Axis: normal Intervals: normal ST/T Wave abnormalities: Non-specific ST segment / T-wave changes, but no evidence of acute ischemia. Narrative Interpretation: no evidence of acute ischemia  ____________________________________________  RADIOLOGY   Dg Chest 2 View  Result Date: 12/16/2016 CLINICAL DATA:  Dyspnea, dry cough and wheeze x2 weeks. EXAM: CHEST  2 VIEW COMPARISON:  None. FINDINGS: The heart size and mediastinal contours are within normal limits. Both lungs are clear.  Degenerative changes are seen along the dorsal spine without acute osseous appearing abnormality. Slight S-shaped scoliosis the thoracolumbar spine as before. IMPRESSION: No active cardiopulmonary disease.  Electronically Signed   By: Ashley Royalty M.D.   On: 12/16/2016 17:32    ____________________________________________   PROCEDURES  Critical Care performed: No   Procedure(s) performed:   Procedures   ____________________________________________   INITIAL IMPRESSION / ASSESSMENT AND PLAN / ED COURSE  Pertinent labs & imaging results that were available during my care of the patient were reviewed by me and considered in my medical decision making (see chart for details).  the patient's lung sounds are clear and she is not in any respiratory distress although she does have a frequent cough.  she has reassuring lab work, cest x-ray, a EKG.  I had an extensive discussion with her and her family.  I think that chronic bronchitis is a much more likely diagnosis for her than PE and she has a Wells Score for PE of zero.  no indication to obtain a d-dimer or emergent CT scan of her chest at this time.  I prescribed an albuterol inhaler and Tessalon and encouraged her to use her CPAP and follow up with primary care doctor.  She and her family are comfortable with this plan.  I gave my usual and customary return precautions.         ____________________________________________  FINAL CLINICAL IMPRESSION(S) / ED DIAGNOSES  Final diagnoses:  Chronic bronchitis, unspecified chronic bronchitis type ([place])     MEDICATIONS GIVEN DURING THIS VISIT:  Medications - No data to display   NEW OUTPATIENT MEDICATIONS STARTED DURING THIS VISIT:  Discharge Medication List as of 12/16/2016  8:15 PM    START taking these medications   Details  benzonatate (TESSALON PERLES) 100 MG capsule Take 1 capsule (100 mg total) by mouth 3 (three) times daily as needed for cough., Starting Fri 12/16/2016,  Print        Discharge Medication List as of 12/16/2016  8:15 PM    CONTINUE these medications which have CHANGED   Details  albuterol (PROVENTIL HFA;VENTOLIN HFA) 108 (90 Base) MCG/ACT inhaler Inhale 2-4 puffs by mouth every 4 hours as needed for wheezing, cough, and/or shortness of breath, Print        Discharge Medication List as of 12/16/2016  8:15 PM       Note:  This document was prepared using Dragon voice recognition software and may include unintentional dictation errors.    Hinda Kehr, MD 12/16/16 2042

## 2016-12-21 ENCOUNTER — Encounter: Payer: Self-pay | Admitting: Oncology

## 2016-12-21 ENCOUNTER — Ambulatory Visit
Admission: RE | Admit: 2016-12-21 | Discharge: 2016-12-21 | Disposition: A | Discharge status: Home / Self Care | Payer: Medicare Other | Source: Ambulatory Visit | Attending: Oncology | Admitting: Oncology

## 2016-12-21 ENCOUNTER — Inpatient Hospital Stay: Payer: Medicare Other | Attending: Oncology | Admitting: Oncology

## 2016-12-21 DIAGNOSIS — J439 Emphysema, unspecified: Secondary | ICD-10-CM | POA: Insufficient documentation

## 2016-12-21 DIAGNOSIS — Z87891 Personal history of nicotine dependence: Secondary | ICD-10-CM

## 2016-12-21 DIAGNOSIS — Z122 Encounter for screening for malignant neoplasm of respiratory organs: Secondary | ICD-10-CM

## 2016-12-21 DIAGNOSIS — F17210 Nicotine dependence, cigarettes, uncomplicated: Secondary | ICD-10-CM | POA: Diagnosis not present

## 2016-12-21 NOTE — Progress Notes (Signed)
In accordance with CMS guidelines, patient has met eligibility criteria including age, absence of signs or symptoms of lung cancer.  Social History  Substance Use Topics  . Smoking status: Current Every Day Smoker    Packs/day: 1.00    Years: 41.00    Types: Cigarettes  . Smokeless tobacco: Never Used  . Alcohol use No     Comment: 2 years sober     A shared decision-making session was conducted prior to the performance of CT scan. This includes one or more decision aids, includes benefits and harms of screening, follow-up diagnostic testing, over-diagnosis, false positive rate, and total radiation exposure.  Counseling on the importance of adherence to annual lung cancer LDCT screening, impact of co-morbidities, and ability or willingness to undergo diagnosis and treatment is imperative for compliance of the program.  Counseling on the importance of continued smoking cessation for former smokers; the importance of smoking cessation for current smokers, and information about tobacco cessation interventions have been given to patient including [place] and 1800 quit  programs.  Written order for lung cancer screening with LDCT has been given to the patient and any and all questions have been answered to the best of my abilities.   Yearly follow up will be coordinated by Burgess Estelle, Thoracic Navigator.  Faythe Casa, NP 12/21/2016 9:17 AM

## 2016-12-22 ENCOUNTER — Encounter: Payer: Self-pay | Admitting: *Deleted

## 2017-01-31 ENCOUNTER — Other Ambulatory Visit: Payer: Self-pay | Admitting: Family Medicine

## 2017-01-31 DIAGNOSIS — F17200 Nicotine dependence, unspecified, uncomplicated: Secondary | ICD-10-CM

## 2017-02-01 MED ORDER — VARENICLINE TARTRATE 1 MG PO TABS: 1.0000 mg | ORAL_TABLET | Freq: Two times a day (BID) | ORAL | 3 refills | Status: DC

## 2017-02-01 MED ORDER — AMLODIPINE BESYLATE 5 MG PO TABS: 5.0000 mg | ORAL_TABLET | Freq: Every day | ORAL | 11 refills | Status: DC

## 2017-02-15 DIAGNOSIS — M5416 Radiculopathy, lumbar region: Secondary | ICD-10-CM | POA: Diagnosis not present

## 2017-02-15 DIAGNOSIS — M5136 Other intervertebral disc degeneration, lumbar region: Secondary | ICD-10-CM | POA: Diagnosis not present

## 2017-02-17 ENCOUNTER — Ambulatory Visit: Payer: Medicare Other | Admitting: Family Medicine

## 2017-02-27 ENCOUNTER — Encounter: Payer: Self-pay | Admitting: Family Medicine

## 2017-02-27 ENCOUNTER — Ambulatory Visit (INDEPENDENT_AMBULATORY_CARE_PROVIDER_SITE_OTHER): Payer: Medicare Other | Admitting: Family Medicine

## 2017-02-27 DIAGNOSIS — F1021 Alcohol dependence, in remission: Secondary | ICD-10-CM

## 2017-02-27 DIAGNOSIS — Z1231 Encounter for screening mammogram for malignant neoplasm of breast: Secondary | ICD-10-CM

## 2017-02-27 DIAGNOSIS — I10 Essential (primary) hypertension: Secondary | ICD-10-CM

## 2017-02-27 DIAGNOSIS — F17200 Nicotine dependence, unspecified, uncomplicated: Secondary | ICD-10-CM | POA: Diagnosis not present

## 2017-02-27 DIAGNOSIS — F1410 Cocaine abuse, uncomplicated: Secondary | ICD-10-CM | POA: Diagnosis not present

## 2017-02-27 MED ORDER — PEN NEEDLES 31G X 6 MM MISC: 0 refills | Status: DC

## 2017-02-27 MED ORDER — LIRAGLUTIDE -WEIGHT MANAGEMENT 18 MG/3ML ~~LOC~~ SOPN: 0.6000 mg | PEN_INJECTOR | Freq: Every day | SUBCUTANEOUS | 0 refills | Status: DC

## 2017-02-27 MED ORDER — AMLODIPINE BESYLATE 10 MG PO TABS: 10.0000 mg | ORAL_TABLET | Freq: Every day | ORAL | 3 refills | Status: DC

## 2017-02-27 NOTE — Patient Instructions (Signed)
Check out the information at familydoctor.org entitled "Nutrition for Weight Loss: What You Need to Know about Fad Diets" Try to lose between 1-2 pounds per week by taking in fewer calories and burning off more calories You can succeed by limiting portions, limiting foods dense in calories and fat, becoming more active, and drinking 8 glasses of water a day (64 ounces) Don't skip meals, especially breakfast, as skipping meals may alter your metabolism Do not use over-the-counter weight loss pills or gimmicks that claim rapid weight loss A healthy BMI (or body mass index) is between 18.5 and 24.9 You can calculate your ideal BMI at the NIH website ClubMonetize.fr Try to cut back on your daily net calories by 500 Try to not eat anything from a cow or pig (just a rule of thumb) Try to get 150 minutes of activity every week Start the new medicine and see me 6 weeks after last

## 2017-02-27 NOTE — Progress Notes (Signed)
BP 118/80 (BP Location: Left Arm, Patient Position: Sitting, Cuff Size: Large)   Pulse 79   Temp 98.1 F (36.7 C) (Oral)   Ht 5' 2.25" (1.581 m)   Wt 232 lb (105.2 kg)   BMI 42.09 kg/m    Subjective:    Patient ID: Susan [name], female    DOB: 12-14-1960, 56 y.o.   MRN: [id number]  HPI: Susan [name] is a 56 y.o. female  Chief Complaint  Patient presents with  . Follow-up    HPI Relapsed with alcohol and cocaine for 4 days, back in recovery She goes to group 4 hours a day, 3 days a week She is at 7 days now, counts after she completes the whole day She is steadily gaining weight; has cut out sweets; always gets her dressing on the side, uses a teaspoon of regular dressing, minimal Doesn't eat out, no bread, no soda Her insurance would cover victoza for weight loss; her pharmacist suggested this Lab Results  Component Value Date   TSH 0.94 10/14/2016  she is active, mows two yards, cleaning basement, doing laundry, go go go She is frustrated with her weight She has already seen the nutritionist She consulted with a bariatric surgeon over the phone, just to get information Avoiding chips Needs refills of blood pressure medicine; went back on amlodipine; it was high, stress  Depression screen [facility] 2/9 02/27/2017 12/01/2016 10/19/2016 10/14/2016 07/15/2016  Decreased Interest 3 - 0 0 3  Down, Depressed, Hopeless 3 1 1  0 3  PHQ - 2 Score 6 1 1  0 6  Altered sleeping 1 - - - 3  Tired, decreased energy 3 - - - 2  Change in appetite 0 - - - 1  Feeling bad or failure about yourself  3 - - - 1  Trouble concentrating 0 - - - 2  Moving slowly or fidgety/restless 0 - - - 0  Suicidal thoughts 0 - - - 0  PHQ-9 Score 13 - - - 15  Difficult doing work/chores Somewhat difficult - - - Somewhat difficult  actively seeing psychologist and psychiatrist, in group therapy  Relevant past medical, surgical, family and social history reviewed Past Medical History:  Diagnosis Date  .  Anxiety   . Arthritis    rheumatoid arthritis  . Asthma   . Bipolar disorder ([place])   . Degenerative disc disease at L5-S1 level 09/28/2016   See ortho note May 2018  . Depression    bipolar, hx of suicide attempt  . Dyspnea    with exertion  . GERD (gastroesophageal reflux disease)   . H/O suicide attempt    slit wrists  . History of alcohol abuse   . History of diverticulitis 2013  . History of hepatitis C    HEP "C"--three years ago  . History of MRSA infection 2013  . Hypertension   . Hypothyroidism   . 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 Multinodular thyroid   . OSA (obstructive sleep apnea)    managed by Dr. Manuella Ghazi  . Osteoporosis   . Post-traumatic osteoarthritis of right knee 09/24/2015  . Sleep apnea     Dx 3 years ago. Use C-PAP  . Thyroid disease    Goiter  . Vitamin B12 deficiency   . Vitamin D deficiency disease    Past Surgical History:  Procedure Laterality Date  . BILATERAL SALPINGOOPHORECTOMY     due to abnormal mass  . BREAST SURGERY Left 20 yrs ago  .  CESAREAN SECTION    . HERNIA REPAIR  06/2011, July 2014   Ventral wall repair with Physiomesh  . TONSILLECTOMY    . TOTAL KNEE ARTHROPLASTY Right 05/10/2016   Procedure: TOTAL KNEE ARTHROPLASTY;  Surgeon: Corky Mull, MD;  Location: [facility] ORS;  Service: Orthopedics;  Laterality: Right;  . TUBAL LIGATION     Family History  Problem Relation Age of Onset  . Alcohol abuse Father   . Heart attack Father   . Arthritis Mother   . Asthma Mother   . Mental illness Mother   . Thyroid disease Mother   . COPD Mother   . Heart disease Mother   . Congestive Heart Failure Mother   . Arthritis Brother   . Mental illness Brother   . Cancer Brother        non-hodkins lymphoma  . Alcohol abuse Sister   . Drug abuse Sister   . Mental illness Sister   . Mental illness Sister   . Fibromyalgia Sister   . Obesity Sister   . Pneumonia Sister   . Mental illness Sister   . Alcohol abuse Sister   . Drug abuse Sister   .  Diabetes Neg Hx   . Stroke Neg Hx    Social History   Social History  . Marital status: Widowed    Spouse name: N/A  . Number of children: N/A  . Years of education: N/A   Occupational History  . Not on file.   Social History Main Topics  . Smoking status: Current Every Day Smoker    Packs/day: 0.50    Years: 41.00    Types: Cigarettes  . Smokeless tobacco: Never Used  . Alcohol use No     Comment: 2 years sober  . 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 Sexual activity: Not Currently   Other Topics Concern  . Not on file   Social History Narrative  . No narrative on file    Interim medical history since last visit reviewed. Allergies and medications reviewed  Review of Systems  Constitutional: Positive for unexpected weight change (gain).  Musculoskeletal:       Left thumb pain; she'll contact ortho   Per HPI unless specifically indicated above     Objective:    BP 118/80 (BP Location: Left Arm, Patient Position: Sitting, Cuff Size: Large)   Pulse 79   Temp 98.1 F (36.7 C) (Oral)   Ht 5' 2.25" (1.581 m)   Wt 232 lb (105.2 kg)   BMI 42.09 kg/m   Wt Readings from Last 3 Encounters:  02/27/17 232 lb (105.2 kg)  12/21/16 224 lb (101.6 kg)  12/16/16 227 lb (103 kg)    Physical Exam  Constitutional: She appears well-developed and well-nourished. No distress.  Morbidly obese; weight gain 8 pounds over last 2+ months  HENT:  Head: Normocephalic and atraumatic.  Eyes: EOM are normal. No scleral icterus.  Neck: No thyromegaly present.  Cardiovascular: Normal rate, regular rhythm and normal heart sounds.   No murmur heard. Pulmonary/Chest: Effort normal and breath sounds normal. No respiratory distress. She has no wheezes.  Abdominal: Soft. Bowel sounds are normal. She exhibits no distension.  Musculoskeletal: Normal range of motion. She exhibits no edema.  Neurological: She is alert. She exhibits normal muscle tone.    Skin: Skin is warm and dry. She is not diaphoretic. No pallor.  Psychiatric: She has a normal mood and  affect. Her behavior is normal. Judgment and thought content normal. Her mood appears not anxious. She does not exhibit a depressed mood.   Results for orders placed or performed during the hospital encounter of 22/97/98  Basic metabolic panel  Result Value Ref Range   Sodium 139 135 - 145 mmol/L   Potassium 3.8 3.5 - 5.1 mmol/L   Chloride 105 101 - 111 mmol/L   CO2 25 22 - 32 mmol/L   Glucose, Bld 91 65 - 99 mg/dL   BUN 7 6 - 20 mg/dL   Creatinine, Ser 0.86 0.44 - 1.00 mg/dL   Calcium 9.0 8.9 - 10.3 mg/dL   GFR calc non Af Amer >60 >60 mL/min   GFR calc Af Amer >60 >60 mL/min   Anion gap 9 5 - 15  CBC  Result Value Ref Range   WBC 10.4 3.6 - 11.0 K/uL   RBC 4.46 3.80 - 5.20 MIL/uL   Hemoglobin 14.3 12.0 - 16.0 g/dL   HCT 41.9 35.0 - 47.0 %   MCV 93.9 80.0 - 100.0 fL   MCH 32.1 26.0 - 34.0 pg   MCHC 34.2 32.0 - 36.0 g/dL   RDW 12.7 11.5 - 14.5 %   Platelets 247 150 - 440 K/uL  Troponin I  Result Value Ref Range   Troponin I <0.03 <0.03 ng/mL      Assessment & Plan:   Problem List Items Addressed This Visit      Cardiovascular and Mediastinum   Hypertension    BP had gone up; patient now controlled on 10 mg amlo; refills provided; hopefully as things calm down, we can drop her dose down again      Relevant Medications   amLODipine (NORVASC) 10 MG tablet     Other   Tobacco dependence    Encouraged cessation; gave her 1-800-QUIT-NOW yellow tear off Rx      Morbid obesity ([place]) - Primary    BMI today is over 42; pharmacist suggested [name] but I explained that is for diabetes technically and saxenda so I will prescribe that; no personal hx of MTC; no fam hx of MEN-2; start Rx process; in addition, stay active, avoid sugary drinks, stay hydrated; return in 6 weeks after starting medicine      Relevant Medications   Liraglutide -Weight Management (SAXENDA) 18  MG/3ML SOPN   Cocaine use disorder ([place])    Patient relapsed, used for four days, now clean for seven days; active in therapy and recovery right now; BP and pulse normal; encouragement given      Alcoholism in recovery ([place])    Back in recovery; 7 days sobriety; continue working with counselors, day program, psychiatrist       Other Visit Diagnoses    Encounter for screening mammogram for breast cancer       encourged patient to get her mammogram done; breast cancer awareness month, mammo was ordered in June       Follow up plan: Return in about 6 weeks (around 04/10/2017) for follow-up visit with Dr. Sanda Klein.  An after-visit summary was printed and given to the patient at [place].  Please see the patient instructions which may contain other information and recommendations beyond what is mentioned above in the assessment and plan.  Meds ordered this encounter  Medications  . DISCONTD: amLODipine (NORVASC) 10 MG tablet  . DISCONTD: CHANTIX STARTING MONTH PAK 0.5 MG X 11 & 1 MG X 42 tablet  . Liraglutide -Weight Management (SAXENDA) 18  MG/3ML SOPN    Sig: Inject 0.6 mg into the skin daily. x 1 week, then 1.2 mg daily x 1 week, then 1.8 mg daily x 1 week, then 2.4 mg daily x 1 week, then 3 mg daily    Dispense:  3 mL    Refill:  0  . Insulin Pen Needle (PEN NEEDLES) 31G X 6 MM MISC    Sig: For use with Saxenda pen for daily injections    Dispense:  90 each    Refill:  0  . amLODipine (NORVASC) 10 MG tablet    Sig: Take 1 tablet (10 mg total) by mouth daily.    Dispense:  90 tablet    Refill:  3    No orders of the defined types were placed in this encounter.

## 2017-02-27 NOTE — Assessment & Plan Note (Signed)
Back in recovery; 7 days sobriety; continue working with counselors, day program, psychiatrist

## 2017-02-27 NOTE — Assessment & Plan Note (Signed)
Encouraged cessation; gave her 1-800-QUIT-NOW yellow tear off Rx

## 2017-02-27 NOTE — Assessment & Plan Note (Signed)
BMI today is over 42; pharmacist suggested [name] but I explained that is for diabetes technically and saxenda so I will prescribe that; no personal hx of MTC; no fam hx of MEN-2; start Rx process; in addition, stay active, avoid sugary drinks, stay hydrated; return in 6 weeks after starting medicine

## 2017-02-27 NOTE — Assessment & Plan Note (Signed)
Patient relapsed, used for four days, now clean for seven days; active in therapy and recovery right now; BP and pulse normal; encouragement given

## 2017-02-27 NOTE — Assessment & Plan Note (Signed)
BP had gone up; patient now controlled on 10 mg amlo; refills provided; hopefully as things calm down, we can drop her dose down again

## 2017-03-02 DIAGNOSIS — Z79899 Other long term (current) drug therapy: Secondary | ICD-10-CM | POA: Diagnosis not present

## 2017-03-03 DIAGNOSIS — Z23 Encounter for immunization: Secondary | ICD-10-CM | POA: Diagnosis not present

## 2017-03-06 ENCOUNTER — Telehealth: Payer: Self-pay | Admitting: Family Medicine

## 2017-03-06 NOTE — Telephone Encounter (Signed)
Pt states that Kirke Shaggy is not covered by her insurance, wants victoza, per your last note you have advised pt that [name] is technically for diabetics. Please advise. Thanks

## 2017-03-06 NOTE — Telephone Encounter (Signed)
Pt states she was put on Saxenda but her insurance does not cover that. She states her insurance will cover [name]. Please advise.

## 2017-03-08 NOTE — Telephone Encounter (Signed)
Please inform patient Dr. Sanda Klein will give her a call tom,orrow

## 2017-03-08 NOTE — Telephone Encounter (Signed)
Left voice message.

## 2017-03-09 DIAGNOSIS — Z79899 Other long term (current) drug therapy: Secondary | ICD-10-CM | POA: Diagnosis not present

## 2017-03-09 MED ORDER — LIRAGLUTIDE 18 MG/3ML ~~LOC~~ SOPN: PEN_INJECTOR | SUBCUTANEOUS | 0 refills | Status: DC

## 2017-03-09 NOTE — Telephone Encounter (Signed)
Called pt no answer. LM for pt informing her of information below. To call back to schedule appt if she can get rx.

## 2017-03-09 NOTE — Telephone Encounter (Signed)
I can prescribe [name] but it will be considered off-label, and I don't know if insurance will pay for [name] either; I'll be happy to prescribe it, though; f/u with me 4 weeks after she starts it

## 2017-03-14 DIAGNOSIS — M5416 Radiculopathy, lumbar region: Secondary | ICD-10-CM | POA: Diagnosis not present

## 2017-03-14 DIAGNOSIS — M48062 Spinal stenosis, lumbar region with neurogenic claudication: Secondary | ICD-10-CM | POA: Diagnosis not present

## 2017-03-14 DIAGNOSIS — M5136 Other intervertebral disc degeneration, lumbar region: Secondary | ICD-10-CM | POA: Diagnosis not present

## 2017-03-16 ENCOUNTER — Ambulatory Visit
Admission: RE | Admit: 2017-03-16 | Discharge: 2017-03-16 | Disposition: A | Discharge status: Home / Self Care | Payer: Medicare Other | Source: Ambulatory Visit | Attending: Family Medicine | Admitting: Family Medicine

## 2017-03-16 DIAGNOSIS — Z1231 Encounter for screening mammogram for malignant neoplasm of breast: Secondary | ICD-10-CM | POA: Insufficient documentation

## 2017-03-16 DIAGNOSIS — Z1239 Encounter for other screening for malignant neoplasm of breast: Secondary | ICD-10-CM

## 2017-03-17 ENCOUNTER — Encounter: Payer: Self-pay | Admitting: Family Medicine

## 2017-03-17 ENCOUNTER — Telehealth: Payer: Self-pay | Admitting: Family Medicine

## 2017-03-17 NOTE — Telephone Encounter (Signed)
I reviewed pain medicine note Doctor has documented that patient has been completely sober since August 2016 Patient reported to me that she had relapsed I want her to be up front and talk with her pain clinic doctor about her relapse, as that is important for him to know I did document that in my note dated 02/27/17 I tried to reach her personally to discuss, left message that I will send a note through [place], so please check that and call me with any questions or concerns

## 2017-03-21 ENCOUNTER — Inpatient Hospital Stay
Admission: RE | Admit: 2017-03-21 | Discharge: 2017-03-21 | Disposition: A | Discharge status: Home / Self Care | Payer: Self-pay | Source: Ambulatory Visit | Attending: *Deleted | Admitting: *Deleted

## 2017-03-21 ENCOUNTER — Other Ambulatory Visit: Payer: Self-pay | Admitting: *Deleted

## 2017-03-21 DIAGNOSIS — Z9289 Personal history of other medical treatment: Secondary | ICD-10-CM

## 2017-03-22 DIAGNOSIS — F411 Generalized anxiety disorder: Secondary | ICD-10-CM | POA: Diagnosis not present

## 2017-03-28 ENCOUNTER — Telehealth: Payer: Self-pay | Admitting: Family Medicine

## 2017-03-28 DIAGNOSIS — Z5181 Encounter for therapeutic drug level monitoring: Secondary | ICD-10-CM

## 2017-03-28 DIAGNOSIS — E785 Hyperlipidemia, unspecified: Secondary | ICD-10-CM | POA: Insufficient documentation

## 2017-03-28 MED ORDER — ATORVASTATIN CALCIUM 10 MG PO TABS
10.0000 mg | ORAL_TABLET | Freq: Every day | ORAL | 1 refills | Status: DC
Start: 2017-03-28 — End: 2017-05-17

## 2017-03-28 NOTE — Telephone Encounter (Signed)
Please call the pt Pharmacy did a nice review of her medicines for me They recommend a statin Is she willing to start medicine to help lower her cholesterol? If so, back to me for Rx We'll want to recheck her lipids and SGPT in 6-8 weeks

## 2017-03-28 NOTE — Assessment & Plan Note (Signed)
Start statin; recheck labs in 6-8 weeks 

## 2017-03-28 NOTE — Telephone Encounter (Signed)
Called pt, advised her on information below. Pt gave verbal understanding and agrees to start statin. Pt also wanted to let you know that her [name] was approved and she has been taking it since Saturday. Thanks!

## 2017-03-28 NOTE — Telephone Encounter (Signed)
Great Rx sent I put in new orders for lipids and sgpt due in 6-8 weeks

## 2017-04-07 DIAGNOSIS — Z79899 Other long term (current) drug therapy: Secondary | ICD-10-CM | POA: Diagnosis not present

## 2017-04-14 ENCOUNTER — Ambulatory Visit: Payer: Medicare Other | Admitting: Family Medicine

## 2017-04-18 ENCOUNTER — Ambulatory Visit: Payer: Medicare Other | Admitting: Family Medicine

## 2017-04-25 ENCOUNTER — Other Ambulatory Visit: Payer: Self-pay | Admitting: Family Medicine

## 2017-04-25 DIAGNOSIS — I10 Essential (primary) hypertension: Secondary | ICD-10-CM

## 2017-04-25 NOTE — Telephone Encounter (Signed)
Last Cr and K+ reviewed from Aug; Rx approved

## 2017-05-10 DIAGNOSIS — F411 Generalized anxiety disorder: Secondary | ICD-10-CM | POA: Diagnosis not present

## 2017-05-16 ENCOUNTER — Encounter: Payer: Self-pay | Admitting: Family Medicine

## 2017-05-16 ENCOUNTER — Ambulatory Visit (INDEPENDENT_AMBULATORY_CARE_PROVIDER_SITE_OTHER): Payer: Medicare Other | Admitting: Family Medicine

## 2017-05-16 VITALS — BP 116/58 | HR 94 | Temp 98.0°F | Ht 62.25 in | Wt 221.3 lb

## 2017-05-16 DIAGNOSIS — F1021 Alcohol dependence, in remission: Secondary | ICD-10-CM | POA: Diagnosis not present

## 2017-05-16 DIAGNOSIS — Z114 Encounter for screening for human immunodeficiency virus [HIV]: Secondary | ICD-10-CM | POA: Diagnosis not present

## 2017-05-16 DIAGNOSIS — E785 Hyperlipidemia, unspecified: Secondary | ICD-10-CM

## 2017-05-16 DIAGNOSIS — Z720 Tobacco use: Secondary | ICD-10-CM

## 2017-05-16 DIAGNOSIS — Z5181 Encounter for therapeutic drug level monitoring: Secondary | ICD-10-CM | POA: Diagnosis not present

## 2017-05-16 DIAGNOSIS — I10 Essential (primary) hypertension: Secondary | ICD-10-CM

## 2017-05-16 DIAGNOSIS — E786 Lipoprotein deficiency: Secondary | ICD-10-CM | POA: Diagnosis not present

## 2017-05-16 DIAGNOSIS — B356 Tinea cruris: Secondary | ICD-10-CM | POA: Diagnosis not present

## 2017-05-16 MED ORDER — CLOTRIMAZOLE 1 % EX CREA: 1.0000 "application " | TOPICAL_CREAM | Freq: Two times a day (BID) | CUTANEOUS | 0 refills | Status: DC

## 2017-05-16 MED ORDER — AMLODIPINE BESYLATE 5 MG PO TABS: 5.0000 mg | ORAL_TABLET | Freq: Every day | ORAL | 3 refills | Status: DC

## 2017-05-16 NOTE — Assessment & Plan Note (Signed)
She is working on her sobriety; she is not ready to quit

## 2017-05-16 NOTE — Assessment & Plan Note (Signed)
Check lipids today 

## 2017-05-16 NOTE — Assessment & Plan Note (Signed)
So proud of her; don't get complacent; keep working one day at a time

## 2017-05-16 NOTE — Assessment & Plan Note (Signed)
She is doing so well, we are decreasing the CCB from 10 mg daily to 5 mg daily

## 2017-05-16 NOTE — Assessment & Plan Note (Signed)
Significant weight loss, praise given

## 2017-05-16 NOTE — Patient Instructions (Addendum)
We'll get labs today One day at a time Try the cream and air things out Decrease your amlodipine from 10 mg to 5 mg

## 2017-05-16 NOTE — Assessment & Plan Note (Signed)
Check SGPT

## 2017-05-16 NOTE — Assessment & Plan Note (Signed)
>>  ASSESSMENT AND PLAN FOR TOBACCO USE DISORDER WRITTEN ON 05/16/2017  2:23 PM BY LADA, MELINDA P, MD  She is working on her sobriety; she is not ready to quit

## 2017-05-16 NOTE — Progress Notes (Signed)
BP (!) 116/58 (BP Location: Left Arm, Patient Position: Sitting, Cuff Size: Large)   Pulse 94   Temp 98 F (36.7 C) (Oral)   Ht 5' 2.25" (1.581 m)   Wt 221 lb 4.8 oz (100.4 kg)   SpO2 95%   BMI 40.15 kg/m    Subjective:    Patient ID: Susan [name], female    DOB: Apr 10, 1961, 57 y.o.   MRN: [id number]  HPI: Susan [name] is a 57 y.o. female  Chief Complaint  Patient presents with  . Follow-up  . Rash    in groin, believes it may be yeast     HPI Patient is here for f/u She has a rash in her groin which may be yeast; she has been using econzaole; itching in the bathtub; scratches the skin; no fever; no drainage Stopped the buspirone On the victoza, asked about sites She is off of chantix; still smoking one pack of cigarettes a day; sobriety more important right now than cigarettes She has 77 days of sobriety; has a safety net with her AA friends; she is staying with positive people who won't contribute to relapse BP much better; eating better; really watching what she eats; loves fruits and veggies; not using salt shaker; getting protein in Seeing podiatrist; swelling on the back on the LEFT Achilles; being really careful; sees him next week Seeing ortho Dr. Sallee Provencal for the swelling in the right hand and left thumb Sees Dr. Cleda Mccreedy for her feet; cannot exercise and hoping to lose even more as she's more active Losing weight, doing great, taking victoza Cholesterol was high; due for check; eating much better; not eating many eggs  Depression screen [facility] 2/9 05/16/2017 02/27/2017 12/01/2016 10/19/2016 10/14/2016  Decreased Interest 0 3 - 0 0  Down, Depressed, Hopeless 0 3 1 1  0  PHQ - 2 Score 0 6 1 1  0  Altered sleeping - 1 - - -  Tired, decreased energy - 3 - - -  Change in appetite - 0 - - -  Feeling bad or failure about yourself  - 3 - - -  Trouble concentrating - 0 - - -  Moving slowly or fidgety/restless - 0 - - -  Suicidal thoughts - 0 - - -  PHQ-9 Score - 13 - - -    Difficult doing work/chores - Somewhat difficult - - -    Relevant past medical, surgical, family and social history reviewed Past Medical History:  Diagnosis Date  . Anxiety   . Arthritis    rheumatoid arthritis  . Asthma   . Bipolar disorder ([place])   . Degenerative disc disease at L5-S1 level 09/28/2016   See ortho note May 2018  . Depression    bipolar, hx of suicide attempt  . Dyspnea    with exertion  . GERD (gastroesophageal reflux disease)   . H/O suicide attempt    slit wrists  . History of alcohol abuse   . History of diverticulitis 2013  . History of hepatitis C    HEP "C"--three years ago  . History of MRSA infection 2013  . Hypertension   . Hypothyroidism   . 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 Multinodular thyroid   . OSA (obstructive sleep apnea)    managed by Dr. Manuella Ghazi  . Osteoporosis   . Post-traumatic osteoarthritis of right knee 09/24/2015  . Sleep apnea     Dx 3 years ago. Use C-PAP  . Thyroid disease  Goiter  . Vitamin B12 deficiency   . Vitamin D deficiency disease    Past Surgical History:  Procedure Laterality Date  . BILATERAL SALPINGOOPHORECTOMY     due to abnormal mass  . BREAST BIOPSY Left    neg  . BREAST SURGERY Left 20 yrs ago  . CESAREAN SECTION    . HERNIA REPAIR  06/2011, July 2014   Ventral wall repair with Physiomesh  . TONSILLECTOMY    . TOTAL KNEE ARTHROPLASTY Right 05/10/2016   Procedure: TOTAL KNEE ARTHROPLASTY;  Surgeon: Corky Mull, MD;  Location: [facility] ORS;  Service: Orthopedics;  Laterality: Right;  . TUBAL LIGATION     Family History  Problem Relation Age of Onset  . Alcohol abuse Father   . Heart attack Father   . Arthritis Mother   . Asthma Mother   . Mental illness Mother   . Thyroid disease Mother   . COPD Mother   . Heart disease Mother   . Congestive Heart Failure Mother   . Arthritis Brother   . Mental illness Brother   . Cancer Brother        non-hodkins lymphoma  . Alcohol abuse Sister   . Drug abuse Sister    . Mental illness Sister   . Mental illness Sister   . Fibromyalgia Sister   . Obesity Sister   . Pneumonia Sister   . Mental illness Sister   . Alcohol abuse Sister   . Drug abuse Sister   . Diabetes Neg Hx   . Stroke Neg Hx   . Breast cancer Neg Hx    Social History   Tobacco Use  . Smoking status: Current Every Day Smoker    Packs/day: 0.50    Years: 41.00    Pack years: 20.50    Types: Cigarettes  . Smokeless tobacco: Never Used  Substance Use Topics  . Alcohol use: No    Alcohol/week: 0.0 oz    Comment: sober since October 2018  . 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 Allergies and medications reviewed  Review of Systems Per HPI unless specifically indicated above     Objective:    BP (!) 116/58 (BP Location: Left Arm, Patient Position: Sitting, Cuff Size: Large)   Pulse 94   Temp 98 F (36.7 C) (Oral)   Ht 5' 2.25" (1.581 m)   Wt 221 lb 4.8 oz (100.4 kg)   SpO2 95%   BMI 40.15 kg/m   Wt Readings from Last 3 Encounters:  05/16/17 221 lb 4.8 oz (100.4 kg)  02/27/17 232 lb (105.2 kg)  12/21/16 224 lb (101.6 kg)    Physical Exam  Constitutional: She appears well-developed and well-nourished. No distress.  Morbidly obese; weight down 10+ pounds over last 2+ months  HENT:  Head: Normocephalic and atraumatic.  Eyes: EOM are normal. No scleral icterus.  Neck: No thyromegaly present.  Cardiovascular: Normal rate, regular rhythm and normal heart sounds.  No murmur heard. Pulmonary/Chest: Effort normal and breath sounds normal. No respiratory distress. She has no wheezes.  Abdominal: Soft. Bowel sounds are normal. She exhibits no distension.  Musculoskeletal: Normal range of motion. She exhibits no edema.  Neurological: She is alert. She exhibits normal muscle tone.  Skin: Skin is warm and dry. She is not diaphoretic. No pallor.  Mild erythema in the RIGHT inguinal  crease; not especially inflamed; symmetric  Psychiatric:  She has a normal mood and affect. Her behavior is normal. Judgment and thought content normal. Her mood appears not anxious. She does not exhibit a depressed mood.   Results for orders placed or performed during the hospital encounter of 59/16/38  Basic metabolic panel  Result Value Ref Range   Sodium 139 135 - 145 mmol/L   Potassium 3.8 3.5 - 5.1 mmol/L   Chloride 105 101 - 111 mmol/L   CO2 25 22 - 32 mmol/L   Glucose, Bld 91 65 - 99 mg/dL   BUN 7 6 - 20 mg/dL   Creatinine, Ser 0.86 0.44 - 1.00 mg/dL   Calcium 9.0 8.9 - 10.3 mg/dL   GFR calc non Af Amer >60 >60 mL/min   GFR calc Af Amer >60 >60 mL/min   Anion gap 9 5 - 15  CBC  Result Value Ref Range   WBC 10.4 3.6 - 11.0 K/uL   RBC 4.46 3.80 - 5.20 MIL/uL   Hemoglobin 14.3 12.0 - 16.0 g/dL   HCT 41.9 35.0 - 47.0 %   MCV 93.9 80.0 - 100.0 fL   MCH 32.1 26.0 - 34.0 pg   MCHC 34.2 32.0 - 36.0 g/dL   RDW 12.7 11.5 - 14.5 %   Platelets 247 150 - 440 K/uL  Troponin I  Result Value Ref Range   Troponin I <0.03 <0.03 ng/mL      Assessment & Plan:   Problem List Items Addressed This Visit      Cardiovascular and Mediastinum   Hypertension    She is doing so well, we are decreasing the CCB from 10 mg daily to 5 mg daily      Relevant Medications   amLODipine (NORVASC) 5 MG tablet     Other   Dyslipidemia   Relevant Orders   Lipid panel   Tobacco abuse    She is working on her sobriety; she is not ready to quit      Morbid obesity ([place])    Significant weight loss, praise given      Medication monitoring encounter    Check SGPT      Relevant Orders   ALT   Low HDL (under 40)    Check lipids today      Relevant Orders   Lipid panel   Encounter for screening for HIV   Relevant Orders   HIV antibody   Alcoholism in recovery [facility])    So proud of her; don't get complacent; keep working one day at a time       Other Visit Diagnoses    Tinea cruris     -  Primary   treat with topical; avoid damp contriction   Relevant Medications   econazole nitrate 1 % cream   clotrimazole (LOTRIMIN) 1 % cream   Screening for HIV (human immunodeficiency virus)           Follow up plan: Return in about 3 months (around 08/14/2017) for follow-up visit with Dr. Sanda Klein.  An after-visit summary was printed and given to the patient at [place].  Please see the patient instructions which may contain other information and recommendations beyond what is mentioned above in the assessment and plan.  Meds ordered this encounter  Medications  . clotrimazole (LOTRIMIN) 1 % cream    Sig: Apply 1 application topically 2 (two) times daily.    Dispense:  30 g    Refill:  0  . amLODipine (NORVASC) 5 MG tablet  Sig: Take 1 tablet (5 mg total) by mouth daily.    Dispense:  90 tablet    Refill:  3    Decreasing dose to 5 mg daily; CANCEL 10 mg refills please    Orders Placed This Encounter  Procedures  . HIV antibody  . ALT  . Lipid panel

## 2017-05-17 ENCOUNTER — Other Ambulatory Visit: Payer: Self-pay | Admitting: Family Medicine

## 2017-05-17 DIAGNOSIS — Z79899 Other long term (current) drug therapy: Secondary | ICD-10-CM | POA: Diagnosis not present

## 2017-05-17 LAB — LIPID PANEL
Cholesterol: 123 mg/dL (ref <200)
HDL: 36 mg/dL — ABNORMAL LOW (ref >50)
LDL Cholesterol (Calc): 68 mg/dL (calc)
Non-HDL Cholesterol (Calc): 87 mg/dL (calc) (ref <130)
Total CHOL/HDL Ratio: 3.4 (calc) (ref <5.0)
Triglycerides: 100 mg/dL (ref <150)

## 2017-05-17 LAB — HIV ANTIBODY (ROUTINE TESTING W REFLEX): HIV 1&2 Ab, 4th Generation: NONREACTIVE

## 2017-05-17 LAB — ALT: ALT: 15 U/L (ref 6–29)

## 2017-05-17 MED ORDER — ATORVASTATIN CALCIUM 10 MG PO TABS: 10.0000 mg | ORAL_TABLET | Freq: Every day | ORAL | 11 refills | Status: DC

## 2017-05-17 NOTE — Progress Notes (Signed)
One year of refills of statin sent

## 2017-05-18 DIAGNOSIS — M7662 Achilles tendinitis, left leg: Secondary | ICD-10-CM | POA: Diagnosis not present

## 2017-05-18 DIAGNOSIS — Z6841 Body Mass Index (BMI) 40.0 and over, adult: Secondary | ICD-10-CM | POA: Diagnosis not present

## 2017-05-18 DIAGNOSIS — M7732 Calcaneal spur, left foot: Secondary | ICD-10-CM | POA: Diagnosis not present

## 2017-05-22 DIAGNOSIS — M654 Radial styloid tenosynovitis [de Quervain]: Secondary | ICD-10-CM | POA: Diagnosis not present

## 2017-05-22 DIAGNOSIS — M65331 Trigger finger, right middle finger: Secondary | ICD-10-CM | POA: Diagnosis not present

## 2017-05-22 DIAGNOSIS — M79641 Pain in right hand: Secondary | ICD-10-CM | POA: Diagnosis not present

## 2017-05-22 DIAGNOSIS — M79642 Pain in left hand: Secondary | ICD-10-CM | POA: Diagnosis not present

## 2017-05-25 ENCOUNTER — Other Ambulatory Visit: Payer: Self-pay | Admitting: Family Medicine

## 2017-06-12 DIAGNOSIS — M7732 Calcaneal spur, left foot: Secondary | ICD-10-CM | POA: Diagnosis not present

## 2017-06-12 DIAGNOSIS — M7662 Achilles tendinitis, left leg: Secondary | ICD-10-CM | POA: Diagnosis not present

## 2017-06-13 ENCOUNTER — Ambulatory Visit (INDEPENDENT_AMBULATORY_CARE_PROVIDER_SITE_OTHER): Payer: Medicare Other | Admitting: Family Medicine

## 2017-06-13 ENCOUNTER — Encounter: Payer: Self-pay | Admitting: Family Medicine

## 2017-06-13 VITALS — BP 104/68 | HR 88 | Temp 98.2°F | Wt 216.4 lb

## 2017-06-13 DIAGNOSIS — R6889 Other general symptoms and signs: Secondary | ICD-10-CM | POA: Diagnosis not present

## 2017-06-13 NOTE — Patient Instructions (Addendum)
Try vitamin C (orange juice if not diabetic or vitamin C tablets) and drink green tea to help your immune system during your illness Get plenty of rest and hydration Call or go to urgent care if cough worsens  Viral Illness, Adult Viruses are tiny germs that can get into a person's body and cause illness. There are many different types of viruses, and they cause many types of illness. Viral illnesses can range from mild to severe. They can affect various parts of the body. Common illnesses that are caused by a virus include colds and the flu. Viral illnesses also include serious conditions such as HIV/AIDS (human immunodeficiency virus/acquired immunodeficiency syndrome). A few viruses have been linked to certain cancers. What are the causes? Many types of viruses can cause illness. Viruses invade cells in your body, multiply, and cause the infected cells to malfunction or die. When the cell dies, it releases more of the virus. When this happens, you develop symptoms of the illness, and the virus continues to spread to other cells. If the virus takes over the function of the cell, it can cause the cell to divide and grow out of control, as is the case when a virus causes cancer. Different viruses get into the body in different ways. You can get a virus by:  Swallowing food or water that is contaminated with the virus.  Breathing in droplets that have been coughed or sneezed into the air by an infected person.  Touching a surface that has been contaminated with the virus and then touching your eyes, nose, or mouth.  Being bitten by an insect or animal that carries the virus.  Having sexual contact with a person who is infected with the virus.  Being exposed to blood or fluids that contain the virus, either through an open cut or during a transfusion.  If a virus enters your body, your body's defense system (immune system) will try to fight the virus. You may be at higher risk for a viral  illness if your immune system is weak. What are the signs or symptoms? Symptoms vary depending on the type of virus and the location of the cells that it invades. Common symptoms of the main types of viral illnesses include: Cold and flu viruses  Fever.  Headache.  Sore throat.  Muscle aches.  Nasal congestion.  Cough. Digestive system (gastrointestinal) viruses  Fever.  Abdominal pain.  Nausea.  Diarrhea. Liver viruses (hepatitis)  Loss of appetite.  Tiredness.  Yellowing of the skin (jaundice). Brain and spinal cord viruses  Fever.  Headache.  Stiff neck.  Nausea and vomiting.  Confusion or sleepiness. Skin viruses  Warts.  Itching.  Rash. Sexually transmitted viruses  Discharge.  Swelling.  Redness.  Rash. How is this treated? Viruses can be difficult to treat because they live within cells. Antibiotic medicines do not treat viruses because these drugs do not get inside cells. Treatment for a viral illness may include:  Resting and drinking plenty of fluids.  Medicines to relieve symptoms. These can include over-the-counter medicine for pain and fever, medicines for cough or congestion, and medicines to relieve diarrhea.  Antiviral medicines. These drugs are available only for certain types of viruses. They may help reduce flu symptoms if taken early. There are also many antiviral medicines for hepatitis and HIV/AIDS.  Some viral illnesses can be prevented with vaccinations. A common example is the flu shot. Follow these instructions at home: Medicines   Take over-the-counter and prescription medicines only  as told by your health care provider.  If you were prescribed an antiviral medicine, take it as told by your health care provider. Do not stop taking the medicine even if you start to feel better.  Be aware of when antibiotics are needed and when they are not needed. Antibiotics do not treat viruses. If your health care provider  thinks that you may have a bacterial infection as well as a viral infection, you may get an antibiotic. ? Do not ask for an antibiotic prescription if you have been diagnosed with a viral illness. That will not make your illness go away faster. ? Frequently taking antibiotics when they are not needed can lead to antibiotic resistance. When this develops, the medicine no longer works against the bacteria that it normally fights. General instructions  Drink enough fluids to keep your urine clear or pale yellow.  Rest as much as possible.  Return to your normal activities as told by your health care provider. Ask your health care provider what activities are safe for you.  Keep all follow-up visits as told by your health care provider. This is important. How is this prevented? Take these actions to reduce your risk of viral infection:  Eat a healthy diet and get enough rest.  Wash your hands often with soap and water. This is especially important when you are in public places. If soap and water are not available, use hand sanitizer.  Avoid close contact with friends and family who have a viral illness.  If you travel to areas where viral gastrointestinal infection is common, avoid drinking water or eating raw food.  Keep your immunizations up to date. Get a flu shot every year as told by your health care provider.  Do not share toothbrushes, nail clippers, razors, or needles with other people.  Always practice safe sex.  Contact a health care provider if:  You have symptoms of a viral illness that do not go away.  Your symptoms come back after going away.  Your symptoms get worse. Get help right away if:  You have trouble breathing.  You have a severe headache or a stiff neck.  You have severe vomiting or abdominal pain. This information is not intended to replace advice given to you by your health care provider. Make sure you discuss any questions you have with your health  care provider. Document Released: 09/04/2015 Document Revised: 10/07/2015 Document Reviewed: 09/04/2015 Elsevier Interactive Patient Education  [employer].

## 2017-06-13 NOTE — Progress Notes (Signed)
BP 104/68 (BP Location: Left Arm, Patient Position: Sitting, Cuff Size: Large)   Pulse 88   Temp 98.2 F (36.8 C) (Oral)   Wt 216 lb 6.4 oz (98.2 kg)   SpO2 96%   BMI 39.26 kg/m    Subjective:    Patient ID: Susan [name], female    DOB: [birth year], 57 y.o.   MRN: [id number]  HPI: Susan [name] is a 57 y.o. female  Chief Complaint  Patient presents with  . URI    Pt states that she is feeling better, but was much worse. clear nasal drainage, denies sore throat     HPI Patient is here for an acute visit She started to get sick on: Thursday Early symptoms included: headache, malaise, right earache, muscle aches on Friday and Saturday Other symptoms: dry throat, little bit of cough, sneezing, nasal drainage Pertinent negatives: rash Further details: was worse and now getting better; throat still gravely Remedies tried: aleve, more water, orange juice Sick contacts: goes to [name], NA meetings, around public a lot Got her flu shot  Depression screen [facility] 2/9 06/13/2017 05/16/2017 02/27/2017 12/01/2016 10/19/2016  Decreased Interest 0 0 3 - 0  Down, Depressed, Hopeless 0 0 3 1 1   PHQ - 2 Score 0 0 6 1 1   Altered sleeping - - 1 - -  Tired, decreased energy - - 3 - -  Change in appetite - - 0 - -  Feeling bad or failure about yourself  - - 3 - -  Trouble concentrating - - 0 - -  Moving slowly or fidgety/restless - - 0 - -  Suicidal thoughts - - 0 - -  PHQ-9 Score - - 13 - -  Difficult doing work/chores - - Somewhat difficult - -    Relevant past medical, surgical, family and social history reviewed Past Medical History:  Diagnosis Date  . Anxiety   . Arthritis    rheumatoid arthritis  . Asthma   . Bipolar disorder ([place])   . Degenerative disc disease at L5-S1 level 09/28/2016   See ortho note May 2018  . Depression    bipolar, hx of suicide attempt  . Dyspnea    with exertion  . GERD (gastroesophageal reflux disease)   . H/O suicide attempt    slit wrists    . History of alcohol abuse   . History of diverticulitis 2013  . History of hepatitis C    HEP "C"--three years ago  . History of MRSA infection 2013  . Hypertension   . Hypothyroidism   . 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 Multinodular thyroid   . OSA (obstructive sleep apnea)    managed by Dr. Manuella Ghazi  . Osteoporosis   . Post-traumatic osteoarthritis of right knee 09/24/2015  . Sleep apnea     Dx 3 years ago. Use C-PAP  . Thyroid disease    Goiter  . Vitamin B12 deficiency   . Vitamin D deficiency disease    Family History  Problem Relation Age of Onset  . Alcohol abuse Father   . Heart attack Father   . Arthritis Mother   . Asthma Mother   . Mental illness Mother   . Thyroid disease Mother   . COPD Mother   . Heart disease Mother   . Congestive Heart Failure Mother   . Arthritis Brother   . Mental illness Brother   . Cancer Brother        non-hodkins lymphoma  .  Alcohol abuse Sister   . Drug abuse Sister   . Mental illness Sister   . Mental illness Sister   . Fibromyalgia Sister   . Obesity Sister   . Pneumonia Sister   . Mental illness Sister   . Alcohol abuse Sister   . Drug abuse Sister   . Diabetes Neg Hx   . Stroke Neg Hx   . Breast cancer Neg Hx    Social History   Tobacco Use  . Smoking status: Current Every Day Smoker    Packs/day: 0.50    Years: 41.00    Pack years: 20.50    Types: Cigarettes  . Smokeless tobacco: Never Used  Substance Use Topics  . Alcohol use: No    Alcohol/week: 0.0 oz    Comment: sober since October 2018  . 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 Allergies and medications reviewed  Review of Systems Per HPI unless specifically indicated above     Objective:    BP 104/68 (BP Location: Left Arm, Patient Position: Sitting, Cuff Size: Large)   Pulse 88   Temp 98.2 F (36.8 C) (Oral)   Wt 216 lb 6.4 oz (98.2 kg)   SpO2 96%   BMI 39.26 kg/m    Wt Readings from Last 3 Encounters:  06/13/17 216 lb 6.4 oz (98.2 kg)  05/16/17 221 lb 4.8 oz (100.4 kg)  02/27/17 232 lb (105.2 kg)    Physical Exam  Constitutional: She appears well-developed and well-nourished.  HENT:  Right Ear: Tympanic membrane and ear canal normal.  Left Ear: Tympanic membrane and ear canal normal.  Nose: Rhinorrhea present.  Mouth/Throat: Oropharynx is clear and moist and mucous membranes are normal.  Eyes: EOM are normal. No scleral icterus.  Cardiovascular: Normal rate and regular rhythm.  Pulmonary/Chest: Effort normal and breath sounds normal.  Skin: She is not diaphoretic. No pallor.  Psychiatric: She has a normal mood and affect. Her behavior is normal.    Results for orders placed or performed in visit on 05/16/17  HIV antibody  Result Value Ref Range   HIV 1&2 Ab, 4th Generation NON-REACTIVE NON-REACTI  ALT  Result Value Ref Range   ALT 15 6 - 29 U/L  Lipid panel  Result Value Ref Range   Cholesterol 123 <200 mg/dL   HDL 36 (L) >50 mg/dL   Triglycerides 100 <150 mg/dL   LDL Cholesterol (Calc) 68 mg/dL (calc)   Total CHOL/HDL Ratio 3.4 <5.0 (calc)   Non-HDL Cholesterol (Calc) 87 <130 mg/dL (calc)      Assessment & Plan:   Problem List Items Addressed This Visit    None    Visit Diagnoses    Flu-like symptoms    -  Primary   suspect she either is getting over flu or another flu-like illness; she opted for no testing today; rest, hydration; she is contagious; watch for sec pneumonia       Follow up plan: No Follow-up on file.  An after-visit summary was printed and given to the patient at [place].  Please see the patient instructions which may contain other information and recommendations beyond what is mentioned above in the assessment and plan.  No orders of the defined types were placed in this encounter.   No orders of the defined types were placed in this encounter.

## 2017-06-14 DIAGNOSIS — M5136 Other intervertebral disc degeneration, lumbar region: Secondary | ICD-10-CM | POA: Diagnosis not present

## 2017-06-14 DIAGNOSIS — M48062 Spinal stenosis, lumbar region with neurogenic claudication: Secondary | ICD-10-CM | POA: Diagnosis not present

## 2017-06-14 DIAGNOSIS — M5416 Radiculopathy, lumbar region: Secondary | ICD-10-CM | POA: Diagnosis not present

## 2017-06-21 ENCOUNTER — Other Ambulatory Visit: Payer: Self-pay | Admitting: Family Medicine

## 2017-07-05 DIAGNOSIS — F411 Generalized anxiety disorder: Secondary | ICD-10-CM | POA: Diagnosis not present

## 2017-07-06 DIAGNOSIS — F411 Generalized anxiety disorder: Secondary | ICD-10-CM | POA: Diagnosis not present

## 2017-07-11 DIAGNOSIS — F411 Generalized anxiety disorder: Secondary | ICD-10-CM | POA: Diagnosis not present

## 2017-07-19 ENCOUNTER — Other Ambulatory Visit: Payer: Self-pay | Admitting: Family Medicine

## 2017-07-19 NOTE — Telephone Encounter (Signed)
Pen needle request too soon Please resolve with pharmacy

## 2017-07-19 NOTE — Telephone Encounter (Signed)
Left voicmail with pahrmacy

## 2017-08-16 ENCOUNTER — Encounter: Payer: Self-pay | Admitting: Family Medicine

## 2017-08-16 ENCOUNTER — Ambulatory Visit (INDEPENDENT_AMBULATORY_CARE_PROVIDER_SITE_OTHER): Payer: Medicare Other | Admitting: Family Medicine

## 2017-08-16 VITALS — BP 140/82 | HR 80 | Temp 98.0°F | Resp 16 | Ht 62.0 in | Wt 214.0 lb

## 2017-08-16 DIAGNOSIS — K5900 Constipation, unspecified: Secondary | ICD-10-CM | POA: Diagnosis not present

## 2017-08-16 DIAGNOSIS — R7301 Impaired fasting glucose: Secondary | ICD-10-CM

## 2017-08-16 DIAGNOSIS — R1030 Lower abdominal pain, unspecified: Secondary | ICD-10-CM | POA: Diagnosis not present

## 2017-08-16 DIAGNOSIS — E786 Lipoprotein deficiency: Secondary | ICD-10-CM

## 2017-08-16 DIAGNOSIS — R5383 Other fatigue: Secondary | ICD-10-CM

## 2017-08-16 DIAGNOSIS — G4733 Obstructive sleep apnea (adult) (pediatric): Secondary | ICD-10-CM | POA: Diagnosis not present

## 2017-08-16 DIAGNOSIS — E0590 Thyrotoxicosis, unspecified without thyrotoxic crisis or storm: Secondary | ICD-10-CM

## 2017-08-16 DIAGNOSIS — I10 Essential (primary) hypertension: Secondary | ICD-10-CM | POA: Diagnosis not present

## 2017-08-16 DIAGNOSIS — E538 Deficiency of other specified B group vitamins: Secondary | ICD-10-CM

## 2017-08-16 DIAGNOSIS — Z720 Tobacco use: Secondary | ICD-10-CM | POA: Diagnosis not present

## 2017-08-16 DIAGNOSIS — Z5181 Encounter for therapeutic drug level monitoring: Secondary | ICD-10-CM

## 2017-08-16 DIAGNOSIS — E559 Vitamin D deficiency, unspecified: Secondary | ICD-10-CM | POA: Diagnosis not present

## 2017-08-16 DIAGNOSIS — R05 Cough: Secondary | ICD-10-CM

## 2017-08-16 DIAGNOSIS — Z9989 Dependence on other enabling machines and devices: Secondary | ICD-10-CM

## 2017-08-16 DIAGNOSIS — R059 Cough, unspecified: Secondary | ICD-10-CM

## 2017-08-16 MED ORDER — AZITHROMYCIN 250 MG PO TABS: ORAL_TABLET | ORAL | 0 refills | Status: DC

## 2017-08-16 NOTE — Assessment & Plan Note (Signed)
For another day; patient is really working on her sobriety and will address later

## 2017-08-16 NOTE — Assessment & Plan Note (Signed)
Check liver and kidneys 

## 2017-08-16 NOTE — Progress Notes (Signed)
BP 140/82   Pulse 80   Temp 98 F (36.7 C) (Oral)   Resp 16   Ht 5\' 2"  (1.575 m)   Wt 214 lb (97.1 kg)   SpO2 97%   BMI 39.14 kg/m    Subjective:    Patient ID: Susan [name], female    DOB: 07/28/1960, 57 y.o.   MRN: [id number]  HPI: Susan [name] is a 57 y.o. female  Chief Complaint  Patient presents with  . Follow-up  . Cough    since lowering amlodopine?    HPI Patient is here for follow-up  She is doing really well with her sobriety; chairing meetings; setting boundaries, one day at a time; reading her NA and AA books every day; in a good place  Smoking is about a pack a day; not going to worry about that right now, as she works on sobriety  She would like a referral to a gastro doctor, lots of pain in her abdomen; more trouble going to the bathroom; worse with taking pain medicine; on a very high fiber diet; very little meat, doing big stir fry; lots of veggies; will try to drink more water and less tea; down to just 1 or 0 equals in tea  BP was a little up today because she is in a little bit of pain  She is taking 2500 mcg of B12, pill; so tired; has CPAP and using that  Prediabetes Lab Results  Component Value Date   HGBA1C 5.2 08/16/2017    Hypothyroidism Lab Results  Component Value Date   TSH 0.85 08/16/2017   Little naggy cough; sometimes brings something up; not green or yellow; smoking; not worse in the morning; does not feel sick; has some discomfort in the lower right chest wall; felt feverish  Obesity; has lost 7 pounds since January; eating better, lots of veggies  Depression screen [facility] 2/9 08/16/2017 06/13/2017 05/16/2017 02/27/2017 12/01/2016  Decreased Interest 0 0 0 3 -  Down, Depressed, Hopeless 0 0 0 3 1  PHQ - 2 Score 0 0 0 6 1  Altered sleeping - - - 1 -  Tired, decreased energy - - - 3 -  Change in appetite - - - 0 -  Feeling bad or failure about yourself  - - - 3 -  Trouble concentrating - - - 0 -  Moving slowly or  fidgety/restless - - - 0 -  Suicidal thoughts - - - 0 -  PHQ-9 Score - - - 13 -  Difficult doing work/chores - - - Somewhat difficult -    Relevant past medical, surgical, family and social history reviewed Past Medical History:  Diagnosis Date  . Anxiety   . Arthritis    rheumatoid arthritis  . Asthma   . Bipolar disorder ([place])   . Degenerative disc disease at L5-S1 level 09/28/2016   See ortho note May 2018  . Depression    bipolar, hx of suicide attempt  . Dyspnea    with exertion  . GERD (gastroesophageal reflux disease)   . H/O suicide attempt    slit wrists  . History of alcohol abuse   . History of diverticulitis 2013  . History of hepatitis C    HEP "C"--three years ago  . History of MRSA infection 2013  . Hypertension   . Hypothyroidism   . 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 Multinodular thyroid   . OSA (obstructive sleep apnea)    managed by Dr.  Manuella Ghazi  . Osteoporosis   . Post-traumatic osteoarthritis of right knee 09/24/2015  . Sleep apnea     Dx 3 years ago. Use C-PAP  . Thyroid disease    Goiter  . Vitamin B12 deficiency   . Vitamin D deficiency disease    Past Surgical History:  Procedure Laterality Date  . BILATERAL SALPINGOOPHORECTOMY     due to abnormal mass  . BREAST BIOPSY Left    neg  . BREAST SURGERY Left 20 yrs ago  . CESAREAN SECTION    . HERNIA REPAIR  06/2011, July 2014   Ventral wall repair with Physiomesh  . TONSILLECTOMY    . TOTAL KNEE ARTHROPLASTY Right 05/10/2016   Procedure: TOTAL KNEE ARTHROPLASTY;  Surgeon: Corky Mull, MD;  Location: [facility] ORS;  Service: Orthopedics;  Laterality: Right;  . TUBAL LIGATION     Family History  Problem Relation Age of Onset  . Alcohol abuse Father   . Heart attack Father   . Arthritis Mother   . Asthma Mother   . Mental illness Mother   . Thyroid disease Mother   . COPD Mother   . Heart disease Mother   . Congestive Heart Failure Mother   . Arthritis Brother   . Mental illness Brother   . Cancer  Brother        non-hodkins lymphoma  . Alcohol abuse Sister   . Drug abuse Sister   . Mental illness Sister   . Mental illness Sister   . Fibromyalgia Sister   . Obesity Sister   . Pneumonia Sister   . Mental illness Sister   . Alcohol abuse Sister   . Drug abuse Sister   . Diabetes Neg Hx   . Stroke Neg Hx   . Breast cancer Neg Hx    Social History   Tobacco Use  . Smoking status: Current Every Day Smoker    Packs/day: 0.50    Years: 41.00    Pack years: 20.50    Types: Cigarettes  . Smokeless tobacco: Never Used  Substance Use Topics  . Alcohol use: No    Alcohol/week: 0.0 oz    Comment: sober since October 2018  . 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 Allergies and medications reviewed  Review of Systems Per HPI unless specifically indicated above     Objective:    BP 140/82   Pulse 80   Temp 98 F (36.7 C) (Oral)   Resp 16   Ht 5\' 2"  (1.575 m)   Wt 214 lb (97.1 kg)   SpO2 97%   BMI 39.14 kg/m   Wt Readings from Last 3 Encounters:  08/16/17 214 lb (97.1 kg)  06/13/17 216 lb 6.4 oz (98.2 kg)  05/16/17 221 lb 4.8 oz (100.4 kg)    Physical Exam  Constitutional: She appears well-developed and well-nourished. No distress.  HENT:  Head: Normocephalic and atraumatic.  Eyes: EOM are normal. No scleral icterus.  Neck: No thyromegaly present.  Cardiovascular: Normal rate, regular rhythm and normal heart sounds.  No murmur heard. Pulmonary/Chest: Effort normal and breath sounds normal. No respiratory distress. She has no wheezes.  Abdominal: Soft. Bowel sounds are normal. She exhibits no distension.  Musculoskeletal: Normal range of motion. She exhibits no edema.  Neurological: She is alert. She exhibits normal muscle tone.  Skin: Skin is warm and dry. She is not diaphoretic. No  pallor.  Psychiatric: She has a normal mood and affect. Her behavior is normal. Judgment and  thought content normal.    Results for orders placed or performed in visit on 08/16/17  TSH  Result Value Ref Range   TSH 0.85 0.40 - 4.50 mIU/L  VITAMIN D 25 Hydroxy (Vit-D Deficiency, Fractures)  Result Value Ref Range   Vit D, 25-Hydroxy 12 (L) 30 - 100 ng/mL  Vitamin B12  Result Value Ref Range   Vitamin B-12 1,569 (H) 200 - 1,100 pg/mL  Hemoglobin A1c  Result Value Ref Range   Hgb A1c MFr Bld 5.2 <5.7 % of total Hgb   Mean Plasma Glucose 103 (calc)   eAG (mmol/L) 5.7 (calc)  Lipid panel  Result Value Ref Range   Cholesterol 132 <200 mg/dL   HDL 38 (L) >50 mg/dL   Triglycerides 118 <150 mg/dL   LDL Cholesterol (Calc) 74 mg/dL (calc)   Total CHOL/HDL Ratio 3.5 <5.0 (calc)   Non-HDL Cholesterol (Calc) 94 <130 mg/dL (calc)  COMPLETE METABOLIC PANEL WITH GFR  Result Value Ref Range   Glucose, Bld 80 65 - 99 mg/dL   BUN 13 7 - 25 mg/dL   Creat 0.79 0.50 - 1.05 mg/dL   GFR, Est Non African American 84 > OR = 60 mL/min/1.13m2   GFR, Est African American 97 > OR = 60 mL/min/1.13m2   BUN/Creatinine Ratio NOT APPLICABLE 6 - 22 (calc)   Sodium 142 135 - 146 mmol/L   Potassium 3.8 3.5 - 5.3 mmol/L   Chloride 107 98 - 110 mmol/L   CO2 29 20 - 32 mmol/L   Calcium 9.0 8.6 - 10.4 mg/dL   Total Protein 6.3 6.1 - 8.1 g/dL   Albumin 4.2 3.6 - 5.1 g/dL   Globulin 2.1 1.9 - 3.7 g/dL (calc)   AG Ratio 2.0 1.0 - 2.5 (calc)   Total Bilirubin 0.3 0.2 - 1.2 mg/dL   Alkaline phosphatase (APISO) 75 33 - 130 U/L   AST 15 10 - 35 U/L   ALT 13 6 - 29 U/L  CBC with Differential/Platelet  Result Value Ref Range   WBC 10.0 3.8 - 10.8 Thousand/uL   RBC 4.48 3.80 - 5.10 Million/uL   Hemoglobin 14.2 11.7 - 15.5 g/dL   HCT 40.4 35.0 - 45.0 %   MCV 90.2 80.0 - 100.0 fL   MCH 31.7 27.0 - 33.0 pg   MCHC 35.1 32.0 - 36.0 g/dL   RDW 12.0 11.0 - 15.0 %   Platelets 221 140 - 400 Thousand/uL   MPV 9.3 7.5 - 12.5 fL   Neutro Abs 5,590 1,500 - 7,800 cells/uL   Lymphs Abs 3,350 850 - 3,900 cells/uL    WBC mixed population 560 200 - 950 cells/uL   Eosinophils Absolute 440 15 - 500 cells/uL   Basophils Absolute 60 0 - 200 cells/uL   Neutrophils Relative % 55.9 %   Total Lymphocyte 33.5 %   Monocytes Relative 5.6 %   Eosinophils Relative 4.4 %   Basophils Relative 0.6 %      Assessment & Plan:   Problem List Items Addressed This Visit      Cardiovascular and Mediastinum   Hypertension    Try DASH guidelines        Respiratory   OSA on CPAP    Continue use        Endocrine   Subclinical hyperthyroidism    Check TSH      Relevant Orders  TSH (Completed)   Impaired fasting glucose    Check A1c; she is working on weight loss      Relevant Orders   Hemoglobin A1c (Completed)     Other   Vitamin D deficiency    Check level and replace if needed      Relevant Orders   VITAMIN D 25 Hydroxy (Vit-D Deficiency, Fractures) (Completed)   Vitamin B12 deficiency    Check B12 to see if shots are needed      Relevant Orders   Vitamin B12 (Completed)   Tobacco abuse    For another day; patient is really working on her sobriety and will address later      Medication monitoring encounter    Check liver and kidneys      Low HDL (under 40)    Check lipids, working on weight loss      Relevant Orders   Lipid panel (Completed)    Other Visit Diagnoses    Lower abdominal pain    -  Primary   Relevant Orders   Ambulatory referral to Gastroenterology   COMPLETE METABOLIC PANEL WITH GFR (Completed)   CBC with Differential/Platelet (Completed)   Constipation, unspecified constipation type       Relevant Orders   Ambulatory referral to Gastroenterology   Other fatigue       Cough       Relevant Orders   DG Chest 2 View (Completed)       Follow up plan: Return in about 3 months (around 11/15/2017).  An after-visit summary was printed and given to the patient at [place].  Please see the patient instructions which may contain other information and  recommendations beyond what is mentioned above in the assessment and plan.  Meds ordered this encounter  Medications  . azithromycin (ZITHROMAX) 250 MG tablet    Sig: Two pills by mouth today, then one pill daily for 4 more days    Dispense:  6 tablet    Refill:  0    Orders Placed This Encounter  Procedures  . DG Chest 2 View  . TSH  . VITAMIN D 25 Hydroxy (Vit-D Deficiency, Fractures)  . Vitamin B12  . Hemoglobin A1c  . Lipid panel  . COMPLETE METABOLIC PANEL WITH GFR  . CBC with Differential/Platelet  . Ambulatory referral to Gastroenterology

## 2017-08-16 NOTE — Assessment & Plan Note (Signed)
Check TSH 

## 2017-08-16 NOTE — Assessment & Plan Note (Signed)
Continue use

## 2017-08-16 NOTE — Assessment & Plan Note (Signed)
>>  ASSESSMENT AND PLAN FOR TOBACCO USE DISORDER WRITTEN ON 08/16/2017  8:28 AM BY LADA, MELINDA P, MD  For another day; patient is really working on her sobriety and will address later

## 2017-08-16 NOTE — Assessment & Plan Note (Signed)
Check level and replace if needed 

## 2017-08-16 NOTE — Assessment & Plan Note (Signed)
Check A1c; she is working on weight loss

## 2017-08-16 NOTE — Assessment & Plan Note (Signed)
Check lipids, working on weight loss

## 2017-08-16 NOTE — Assessment & Plan Note (Signed)
Try DASH guidelines 

## 2017-08-16 NOTE — Assessment & Plan Note (Signed)
Check B12 to see if shots are needed

## 2017-08-16 NOTE — Patient Instructions (Addendum)
Please have a chest xray today We'll consider the losartan as a possible culprit I've put in the gastroenterology referral Let's get labs Check out the information at familydoctor.org entitled "Nutrition for Weight Loss: What You Need to Know about Fad Diets" Try to lose between 1-2 pounds per week by taking in fewer calories and burning off more calories You can succeed by limiting portions, limiting foods dense in calories and fat, becoming more active, and drinking 8 glasses of water a day (64 ounces) Don't skip meals, especially breakfast, as skipping meals may alter your metabolism Do not use over-the-counter weight loss pills or gimmicks that claim rapid weight loss A healthy BMI (or body mass index) is between 18.5 and 24.9 You can calculate your ideal BMI at the [place] website [employer]Mon[employer]fr Start the antibiotics Please do eat yogurt or kimchi or take a probiotic daily for the next month We want to replace the healthy germs in the gut If you notice foul, watery diarrhea in the next two months, schedule an appointment RIGHT AWAY or go to an urgent care or the emergency room if a holiday or over a weekend

## 2017-08-17 ENCOUNTER — Ambulatory Visit
Admission: RE | Admit: 2017-08-17 | Discharge: 2017-08-17 | Disposition: A | Discharge status: Home / Self Care | Payer: Medicare Other | Source: Ambulatory Visit | Attending: Family Medicine | Admitting: Family Medicine

## 2017-08-17 DIAGNOSIS — R05 Cough: Secondary | ICD-10-CM

## 2017-08-17 DIAGNOSIS — R059 Cough, unspecified: Secondary | ICD-10-CM

## 2017-08-17 LAB — CBC WITH DIFFERENTIAL/PLATELET
Basophils Absolute: 60 cells/uL (ref 0–200)
Basophils Relative: 0.6 %
Eosinophils Absolute: 440 cells/uL (ref 15–500)
Eosinophils Relative: 4.4 %
HCT: 40.4 % (ref 35.0–45.0)
Hemoglobin: 14.2 g/dL (ref 11.7–15.5)
Lymphs Abs: 3350 cells/uL (ref 850–3900)
MCH: 31.7 pg (ref 27.0–33.0)
MCHC: 35.1 g/dL (ref 32.0–36.0)
MCV: 90.2 fL (ref 80.0–100.0)
MPV: 9.3 fL (ref 7.5–12.5)
Monocytes Relative: 5.6 %
Neutro Abs: 5590 cells/uL (ref 1500–7800)
Neutrophils Relative %: 55.9 %
Platelets: 221 10*3/uL (ref 140–400)
RBC: 4.48 10*6/uL (ref 3.80–5.10)
RDW: 12 % (ref 11.0–15.0)
Total Lymphocyte: 33.5 %
WBC mixed population: 560 cells/uL (ref 200–950)
WBC: 10 10*3/uL (ref 3.8–10.8)

## 2017-08-17 LAB — LIPID PANEL
Cholesterol: 132 mg/dL (ref <200)
HDL: 38 mg/dL — ABNORMAL LOW (ref >50)
LDL Cholesterol (Calc): 74 mg/dL (calc)
Non-HDL Cholesterol (Calc): 94 mg/dL (calc) (ref <130)
Total CHOL/HDL Ratio: 3.5 (calc) (ref <5.0)
Triglycerides: 118 mg/dL (ref <150)

## 2017-08-17 LAB — COMPLETE METABOLIC PANEL WITH GFR
AG Ratio: 2 (calc) (ref 1.0–2.5)
ALT: 13 U/L (ref 6–29)
AST: 15 U/L (ref 10–35)
Albumin: 4.2 g/dL (ref 3.6–5.1)
Alkaline phosphatase (APISO): 75 U/L (ref 33–130)
BUN: 13 mg/dL (ref 7–25)
CO2: 29 mmol/L (ref 20–32)
Calcium: 9 mg/dL (ref 8.6–10.4)
Chloride: 107 mmol/L (ref 98–110)
Creat: 0.79 mg/dL (ref 0.50–1.05)
GFR, Est African American: 97 mL/min/{1.73_m2} (ref >=60)
GFR, Est Non African American: 84 mL/min/{1.73_m2} (ref >=60)
Globulin: 2.1 g/dL (calc) (ref 1.9–3.7)
Glucose, Bld: 80 mg/dL (ref 65–99)
Potassium: 3.8 mmol/L (ref 3.5–5.3)
Sodium: 142 mmol/L (ref 135–146)
Total Bilirubin: 0.3 mg/dL (ref 0.2–1.2)
Total Protein: 6.3 g/dL (ref 6.1–8.1)

## 2017-08-17 LAB — TSH: TSH: 0.85 m[IU]/L (ref 0.40–4.50)

## 2017-08-17 LAB — HEMOGLOBIN A1C
Hgb A1c MFr Bld: 5.2 % of total Hgb (ref <5.7)
Mean Plasma Glucose: 103 (calc)
eAG (mmol/L): 5.7 (calc)

## 2017-08-17 LAB — VITAMIN D 25 HYDROXY (VIT D DEFICIENCY, FRACTURES): Vit D, 25-Hydroxy: 12 ng/mL — ABNORMAL LOW (ref 30–100)

## 2017-08-17 LAB — VITAMIN B12: Vitamin B-12: 1569 pg/mL — ABNORMAL HIGH (ref 200–1100)

## 2017-08-19 ENCOUNTER — Other Ambulatory Visit: Payer: Self-pay | Admitting: Family Medicine

## 2017-08-19 MED ORDER — VITAMIN D (ERGOCALCIFEROL) 1.25 MG (50000 UNIT) PO CAPS: 50000.0000 [IU] | ORAL_CAPSULE | ORAL | 1 refills | Status: AC

## 2017-08-19 NOTE — Progress Notes (Signed)
Start Rx vit D once a week for 8 weeks, then 5,000 iu twice a WEEK

## 2017-08-22 ENCOUNTER — Other Ambulatory Visit: Payer: Self-pay

## 2017-08-22 NOTE — Telephone Encounter (Signed)
Verify with pharmacy, [name] requested but I don't think it's due I don't think patient should have run through 5 pens plus 3 refills since May 25, 2017 Check with pharmacy to see how long that should last, just in case she's taking too much and we need to intervene I may be wrong, so ask how long 5 pens should last at that dose

## 2017-08-23 NOTE — Telephone Encounter (Signed)
The pharmacy stated yes she will need new refill at the end of the month.

## 2017-09-04 DIAGNOSIS — S90121A Contusion of right lesser toe(s) without damage to nail, initial encounter: Secondary | ICD-10-CM | POA: Diagnosis not present

## 2017-09-04 DIAGNOSIS — M79671 Pain in right foot: Secondary | ICD-10-CM | POA: Diagnosis not present

## 2017-09-04 DIAGNOSIS — M76821 Posterior tibial tendinitis, right leg: Secondary | ICD-10-CM | POA: Diagnosis not present

## 2017-09-04 DIAGNOSIS — S9031XA Contusion of right foot, initial encounter: Secondary | ICD-10-CM | POA: Diagnosis not present

## 2017-09-13 DIAGNOSIS — M5416 Radiculopathy, lumbar region: Secondary | ICD-10-CM | POA: Diagnosis not present

## 2017-09-13 DIAGNOSIS — M48062 Spinal stenosis, lumbar region with neurogenic claudication: Secondary | ICD-10-CM | POA: Diagnosis not present

## 2017-09-13 DIAGNOSIS — M62830 Muscle spasm of back: Secondary | ICD-10-CM | POA: Diagnosis not present

## 2017-09-13 DIAGNOSIS — M5136 Other intervertebral disc degeneration, lumbar region: Secondary | ICD-10-CM | POA: Diagnosis not present

## 2017-09-18 ENCOUNTER — Ambulatory Visit (INDEPENDENT_AMBULATORY_CARE_PROVIDER_SITE_OTHER): Payer: Medicare Other | Admitting: Gastroenterology

## 2017-09-18 ENCOUNTER — Encounter: Payer: Self-pay | Admitting: Gastroenterology

## 2017-09-18 DIAGNOSIS — K581 Irritable bowel syndrome with constipation: Secondary | ICD-10-CM | POA: Diagnosis not present

## 2017-09-18 DIAGNOSIS — M7752 Other enthesopathy of left foot: Secondary | ICD-10-CM | POA: Diagnosis not present

## 2017-09-18 DIAGNOSIS — R198 Other specified symptoms and signs involving the digestive system and abdomen: Secondary | ICD-10-CM

## 2017-09-18 NOTE — Patient Instructions (Signed)
1. Initial bowel cleanse. Purchase 1 bottle of magnesium citrate from the pharmacy.   2. Start taking Linzess 262mcg once a day.  3. Keep a food diary. Bring diary to next appointment.

## 2017-09-18 NOTE — Progress Notes (Signed)
Susan Bellows MD, MRCP(U.K) [place]  [place], [place] [address]  Main: [phone]  Fax: [phone]   Gastroenterology Consultation  Referring Provider:     Arnetha Courser, MD Primary Care Physician:  Arnetha Courser, MD Primary Gastroenterologist:  Dr. Jonathon [name]  Reason for Consultation:     Abdominal pain and constipation         HPI:   Susan [name] is a 57 y.o. y/o female referred for consultation & management  by Dr. Sanda Klein, Satira Anis, MD.     She has been referred for lower abdominal pain and constipation.   Labs 08/2017- Hb 14.2 , CMP,TSH-normal .   Abdominal pain: Onset: Always had some pain but the last 5 months ben worse, once or twice a week, lasts till she has a bowel movement  Site :lower abdomen around her belly button  Radiation: she can "feel the poop moving " painful when it moves and , fels like her bowel movements have "spurs on it"  Severity : 5-6/10 and sometimes worselike a knife, she has had ligation and 2 hernia surgeries and has active hernias Nature of pain:  Aggravating factors: not having a bowel movement and certain foods, never really knows what makes it worse, he calls it a "poop day " thinks on those days she poops all day , thinks she is full of poop on those days. Also has a lot of gas . Eating food doesn't make it worse  Relieving factors :a bowel movement  Weight loss: lost weight intentionally  NSAID use: takes alleve - 2 tablets one time a day - some days she doesn't  PPI use :no  Gall bladder surgery: intact  Frequency of bowel movements: every 3 days sometimes 4 days- very hard  Change in bowel movements:  Its a new change - rock hard  Relief with bowel movements: yes  Gas/Bloating/Abdominal distension: yes   No digital manipulation , she has had a c section and a big episiotomy. Last colonoscopy was 4 years bacl and says was normal.    Takes Vicodin once a week . Only tried high fiber diet so far.   Past  Medical History:  Diagnosis Date  . Anxiety   . Arthritis    rheumatoid arthritis  . Asthma   . Bipolar disorder ([place])   . Degenerative disc disease at L5-S1 level 09/28/2016   See ortho note May 2018  . Depression    bipolar, hx of suicide attempt  . Dyspnea    with exertion  . GERD (gastroesophageal reflux disease)   . H/O suicide attempt    slit wrists  . History of alcohol abuse   . History of diverticulitis 2013  . History of hepatitis C    HEP "C"--three years ago  . History of MRSA infection 2013  . Hypertension   . Hypothyroidism   . 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 Multinodular thyroid   . OSA (obstructive sleep apnea)    managed by Dr. Manuella Ghazi  . Osteoporosis   . Post-traumatic osteoarthritis of right knee 09/24/2015  . Sleep apnea     Dx 3 years ago. Use C-PAP  . Thyroid disease    Goiter  . Vitamin B12 deficiency   . Vitamin D deficiency disease     Past Surgical History:  Procedure Laterality Date  . BILATERAL SALPINGOOPHORECTOMY     due to abnormal mass  . BREAST BIOPSY Left    neg  .  BREAST SURGERY Left 20 yrs ago  . CESAREAN SECTION    . HERNIA REPAIR  06/2011, July 2014   Ventral wall repair with Physiomesh  . TONSILLECTOMY    . TOTAL KNEE ARTHROPLASTY Right 05/10/2016   Procedure: TOTAL KNEE ARTHROPLASTY;  Surgeon: Corky Mull, MD;  Location: [facility] ORS;  Service: Orthopedics;  Laterality: Right;  . TUBAL LIGATION      Prior to Admission medications   Medication Sig Start Date End Date Taking? Authorizing Provider  albuterol (PROVENTIL HFA;VENTOLIN HFA) 108 (90 Base) MCG/ACT inhaler Inhale 2-4 puffs by mouth every 4 hours as needed for wheezing, cough, and/or shortness of breath 12/16/16   Hinda Kehr, MD  amLODipine (NORVASC) 5 MG tablet Take 1 tablet (5 mg total) by mouth daily. 05/16/17   Arnetha Courser, MD  atenolol (TENORMIN) 25 MG tablet Take 1 tablet (25 mg total) by mouth daily. 11/30/16   Arnetha Courser, MD  atorvastatin (LIPITOR) 10 MG tablet Take  1 tablet (10 mg total) by mouth at bedtime. 05/17/17   Arnetha Courser, MD  azithromycin (ZITHROMAX) 250 MG tablet Two pills by mouth today, then one pill daily for 4 more days 08/16/17   Arnetha Courser, MD  clonazePAM (KLONOPIN) 0.5 MG tablet Take 0.5 mg by mouth 3 (three) times daily as needed for anxiety.    [provider]  clotrimazole (LOTRIMIN) 1 % cream Apply 1 application topically 2 (two) times daily. Patient taking differently: Apply 1 application topically as needed.  05/16/17   Arnetha Courser, MD  cyclobenzaprine (FLEXERIL) 5 MG tablet Take 5 mg by mouth at bedtime.  06/04/15   [provider]  DULoxetine (CYMBALTA) 60 MG capsule Take 60 mg by mouth 2 (two) times daily.  07/22/15   [provider]  econazole nitrate 1 % cream Apply 1 application topically daily.    [provider]  gabapentin (NEURONTIN) 300 MG capsule Take 600 mg by mouth 2 (two) times daily.     [provider]  HYDROcodone-acetaminophen (NORCO/VICODIN) 5-325 MG tablet One-half to one pill by mouth daily PRN ([facility]) 11/03/16   [provider]  Insulin Pen Needle (ULTICARE MINI PEN NEEDLES) 31G X 6 MM MISC FOR USE WITH SAXENDA PEN FOR DAILY INJECTIONS 06/21/17   Lada, Satira Anis, MD  liraglutide ([name]) 18 MG/3ML SOPN Inject 0.3 mLs (1.8 mg total) into the skin daily. 05/25/17   Lada, Satira Anis, MD  losartan (COZAAR) 100 MG tablet TAKE 1 TABLET BY MOUTH DAILY 04/25/17   Arnetha Courser, MD  naproxen (NAPROSYN) 500 MG tablet Take 500 mg by mouth 2 (two) times daily with a meal. 11/30/16   [provider]  traMADol (ULTRAM) 50 MG tablet Take 2 tablets by mouth at bedtime. 11/29/16   [provider]  umeclidinium bromide (INCRUSE ELLIPTA) 62.5 MCG/INH AEPB Inhale 1 puff into the lungs daily. 12/02/16   Arnetha Courser, MD  Vitamin D, Ergocalciferol, (DRISDOL) 50000 units CAPS capsule Take 1 capsule (50,000 Units total) by mouth every 7 (seven) days. 08/19/17  10/14/17  Arnetha Courser, MD    Family History  Problem Relation Age of Onset  . Alcohol abuse Father   . Heart attack Father   . Arthritis Mother   . Asthma Mother   . Mental illness Mother   . Thyroid disease Mother   . COPD Mother   . Heart disease Mother   . Congestive Heart Failure Mother   . Arthritis  Brother   . Mental illness Brother   . Cancer Brother        non-hodkins lymphoma  . Alcohol abuse Sister   . Drug abuse Sister   . Mental illness Sister   . Mental illness Sister   . Fibromyalgia Sister   . Obesity Sister   . Pneumonia Sister   . Mental illness Sister   . Alcohol abuse Sister   . Drug abuse Sister   . Diabetes Neg Hx   . Stroke Neg Hx   . Breast cancer Neg Hx      Social History   Tobacco Use  . Smoking status: Current Every Day Smoker    Packs/day: 0.50    Years: 41.00    Pack years: 20.50    Types: Cigarettes  . Smokeless tobacco: Never Used  Substance Use Topics  . Alcohol use: No    Alcohol/week: 0.0 oz    Comment: sober since October 2018  . Drug use: No    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    Allergies as of 09/18/2017 - Review Complete 08/16/2017  Allergen Reaction Noted  . Hydrochlorothiazide Other (See Comments) 06/26/2014  . Lasix [furosemide]  11/08/2012    Review of Systems:    All systems reviewed and negative except where noted in HPI.   Physical Exam:  There were no vitals taken for this visit. No LMP recorded. Patient is postmenopausal. Psych:  Alert and cooperative. Normal mood and affect. General:   Alert,  Well-developed, well-nourished, pleasant and cooperative in NAD Head:  Normocephalic and atraumatic. Eyes:  Sclera clear, no icterus.   Conjunctiva pink. Ears:  Normal auditory acuity. Nose:  No deformity, discharge, or lesions. Mouth:  No deformity or lesions,oropharynx pink & moist. Neck:  Supple; no masses or thyromegaly. Lungs:  Respirations even and unlabored.  Clear throughout to  auscultation.   No wheezes, crackles, or rhonchi. No acute distress. Heart:  Regular rate and rhythm; no murmurs, clicks, rubs, or gallops. Abdomen:  Normal bowel sounds.  No bruits.  Soft, non-tender and non-distended without masses, hepatosplenomegaly or hernias noted.  No guarding or rebound tenderness.    Neurologic:  Alert and oriented x3;  grossly normal neurologically. Skin:  Intact without significant lesions or rashes. No jaundice. Lymph Nodes:  No significant cervical adenopathy. Psych:  Alert and cooperative. Normal mood and affect.  Imaging Studies: No results found.  Assessment and Plan:   BLAISE PALLADINO is a 57 y.o. 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 Etiology likely multifactorial from diabetes, opiods, abdominal surgeries.   Plan  1. Trial of Linzess 290 mcg 2 weeks samples provided. Start after a colon clean out 2. Colonoscopy due to recent change in bowel habits 3. High fiber diet  4. She will bring a food diary to show me , will also try and help her lose weight .    I have discussed alternative options, risks & benefits,  which include, but are not limited to, bleeding, infection, perforation,respiratory complication & drug reaction.  The patient agrees with this plan & written consent will be obtained.     Follow up in 8 weeks   Dr Susan Bellows MD,MRCP(U.K)

## 2017-09-22 ENCOUNTER — Emergency Department: Payer: Medicare Other

## 2017-09-22 ENCOUNTER — Other Ambulatory Visit: Payer: Self-pay

## 2017-09-22 ENCOUNTER — Encounter: Payer: Self-pay | Admitting: *Deleted

## 2017-09-22 DIAGNOSIS — S99922A Unspecified injury of left foot, initial encounter: Secondary | ICD-10-CM | POA: Diagnosis present

## 2017-09-22 DIAGNOSIS — Z79899 Other long term (current) drug therapy: Secondary | ICD-10-CM | POA: Diagnosis not present

## 2017-09-22 DIAGNOSIS — E039 Hypothyroidism, unspecified: Secondary | ICD-10-CM | POA: Insufficient documentation

## 2017-09-22 DIAGNOSIS — Y9301 Activity, walking, marching and hiking: Secondary | ICD-10-CM | POA: Diagnosis not present

## 2017-09-22 DIAGNOSIS — X500XXA Overexertion from strenuous movement or load, initial encounter: Secondary | ICD-10-CM | POA: Diagnosis not present

## 2017-09-22 DIAGNOSIS — J45909 Unspecified asthma, uncomplicated: Secondary | ICD-10-CM | POA: Diagnosis not present

## 2017-09-22 DIAGNOSIS — Y92009 Unspecified place in unspecified non-institutional (private) residence as the place of occurrence of the external cause: Secondary | ICD-10-CM | POA: Insufficient documentation

## 2017-09-22 DIAGNOSIS — S92355A Nondisplaced fracture of fifth metatarsal bone, left foot, initial encounter for closed fracture: Secondary | ICD-10-CM | POA: Diagnosis not present

## 2017-09-22 DIAGNOSIS — F17210 Nicotine dependence, cigarettes, uncomplicated: Secondary | ICD-10-CM | POA: Diagnosis not present

## 2017-09-22 DIAGNOSIS — S92352A Displaced fracture of fifth metatarsal bone, left foot, initial encounter for closed fracture: Secondary | ICD-10-CM | POA: Diagnosis not present

## 2017-09-22 DIAGNOSIS — I10 Essential (primary) hypertension: Secondary | ICD-10-CM | POA: Insufficient documentation

## 2017-09-22 DIAGNOSIS — Y999 Unspecified external cause status: Secondary | ICD-10-CM | POA: Insufficient documentation

## 2017-09-22 NOTE — ED Triage Notes (Signed)
Pt report shaving stepped in a divot this evening and reports having heard her left foot crack. Pt is able to move her toes a little but reports pain when doing so. No discoloration noted. Pedal pulse intact.

## 2017-09-23 ENCOUNTER — Emergency Department
Admission: EM | Admit: 2017-09-23 | Discharge: 2017-09-23 | Disposition: A | Discharge status: Home / Self Care | Payer: Medicare Other | Attending: Emergency Medicine | Admitting: Emergency Medicine

## 2017-09-23 DIAGNOSIS — S92355A Nondisplaced fracture of fifth metatarsal bone, left foot, initial encounter for closed fracture: Secondary | ICD-10-CM

## 2017-09-23 MED ORDER — OXYCODONE-ACETAMINOPHEN 5-325 MG PO TABS
1.0000 | ORAL_TABLET | Freq: Once | ORAL | Status: AC
  Administered 2017-09-23: 1 via ORAL

## 2017-09-23 MED ORDER — KETOROLAC TROMETHAMINE 60 MG/2ML IM SOLN
60.0000 mg | Freq: Once | INTRAMUSCULAR | Status: AC
  Administered 2017-09-23: 60 mg via INTRAMUSCULAR
  Filled 2017-09-23: qty 2

## 2017-09-23 MED ORDER — OXYCODONE-ACETAMINOPHEN 5-325 MG PO TABS: 2.0000 | ORAL_TABLET | ORAL | 0 refills | Status: DC | PRN

## 2017-09-23 MED ORDER — MORPHINE SULFATE (PF) 4 MG/ML IV SOLN
4.0000 mg | Freq: Once | INTRAVENOUS | Status: AC
  Administered 2017-09-23: 4 mg via INTRAMUSCULAR
  Filled 2017-09-23: qty 1

## 2017-09-23 MED ORDER — OXYCODONE-ACETAMINOPHEN 5-325 MG PO TABS
ORAL_TABLET | ORAL | Status: AC
  Filled 2017-09-23: qty 1

## 2017-09-23 NOTE — ED Provider Notes (Signed)
[facility] Emergency Department Provider Note   ____________________________________________   First MD Initiated Contact with Patient 09/23/17 0102     (approximate)  I have reviewed the triage vital signs and the nursing notes.   HISTORY  Chief Complaint Foot Pain    HPI Susan [name] is a 57 y.o. female who comes into the hospital today because she thinks she broke her foot.  She reports that she was on her computer and her foot fell asleep.  She got up to try to get some food for her dog and when she walks she stepped into a defect in her wooden floor.  She reports that when she did step she heard a snap in her left foot and then felt pain.  The patient called her daughter and asked to be brought to the hospital.  The patient iced her foot but she does have some pain and swelling.  The patient rates her pain an 8 out of 10 in intensity.  She states that she does take Vicodin regularly at home for pain.  Past Medical History:  Diagnosis Date  . Anxiety   . Arthritis    rheumatoid arthritis  . Asthma   . Bipolar disorder ([place])   . Degenerative disc disease at L5-S1 level 09/28/2016   See ortho note May 2018  . Depression    bipolar, hx of suicide attempt  . Dyspnea    with exertion  . GERD (gastroesophageal reflux disease)   . H/O suicide attempt    slit wrists  . History of alcohol abuse   . History of diverticulitis 2013  . History of hepatitis C    HEP "C"--three years ago  . History of MRSA infection 2013  . Hypertension   . Hypothyroidism   . 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 Multinodular thyroid   . OSA (obstructive sleep apnea)    managed by Dr. Manuella Ghazi  . Osteoporosis   . Post-traumatic osteoarthritis of right knee 09/24/2015  . Sleep apnea     Dx 3 years ago. Use C-PAP  . Thyroid disease    Goiter  . Vitamin B12 deficiency   . Vitamin D deficiency disease     Patient Active Problem List   Diagnosis Date Noted  . Encounter for  screening for HIV 05/16/2017  . Dyslipidemia 03/28/2017  . Cocaine use disorder ([place]) 02/27/2017  . Low HDL (under 40) 10/14/2016  . Degenerative disc disease at L5-S1 level 09/28/2016  . 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 Tobacco abuse 04/21/2016  . Cracked skin on feet 01/19/2016  . Diastasis recti 12/18/2015  . GERD (gastroesophageal reflux disease) 12/17/2015  . Chronic back pain 10/09/2015  . Post-traumatic osteoarthritis of right knee 09/24/2015  . Morbid obesity ([place]) 08/06/2015  . Pain in right knee 08/03/2015  . Postmenopausal 07/08/2015  . Cervical cancer screening 07/08/2015  . Alcoholism in recovery ([place]) 04/13/2015  . Impaired fasting glucose 04/10/2015  . Need for Streptococcus pneumoniae and influenza vaccination 04/09/2015  . Vitamin D deficiency 04/09/2015  . Vitamin B12 deficiency 04/09/2015  . Medication monitoring encounter 04/09/2015  . OSA on CPAP 04/09/2015  . Breast cancer screening 04/09/2015  . Subclinical hyperthyroidism 06/26/2014  . Toxic multinodular goiter 06/26/2014  . Hypertension 06/26/2014  . Hepatitis C 06/26/2014  . Bipolar disorder ([place]) 06/26/2014  . Throat dryness 05/20/2014  . Lumbar radiculitis 10/03/2013  . Diverticulosis 08/24/2013  . Recurrent ventral hernia 11/08/2012  .  Incisional hernia 11/08/2012    Past Surgical History:  Procedure Laterality Date  . BILATERAL SALPINGOOPHORECTOMY     due to abnormal mass  . BREAST BIOPSY Left    neg  . BREAST SURGERY Left 20 yrs ago  . CESAREAN SECTION    . HERNIA REPAIR  06/2011, July 2014   Ventral wall repair with Physiomesh  . TONSILLECTOMY    . TOTAL KNEE ARTHROPLASTY Right 05/10/2016   Procedure: TOTAL KNEE ARTHROPLASTY;  Surgeon: Corky Mull, MD;  Location: [facility] ORS;  Service: Orthopedics;  Laterality: Right;  . TUBAL LIGATION      Prior to Admission medications   Medication Sig Start Date End Date Taking? Authorizing Provider  albuterol  (PROVENTIL HFA;VENTOLIN HFA) 108 (90 Base) MCG/ACT inhaler Inhale 2-4 puffs by mouth every 4 hours as needed for wheezing, cough, and/or shortness of breath 12/16/16   Hinda Kehr, MD  amLODipine (NORVASC) 5 MG tablet Take 1 tablet (5 mg total) by mouth daily. 05/16/17   Arnetha Courser, MD  atenolol (TENORMIN) 25 MG tablet Take 1 tablet (25 mg total) by mouth daily. 11/30/16   Arnetha Courser, MD  atorvastatin (LIPITOR) 10 MG tablet Take 1 tablet (10 mg total) by mouth at bedtime. 05/17/17   Lada, Satira Anis, MD  clonazePAM (KLONOPIN) 0.5 MG tablet Take 0.5 mg by mouth 3 (three) times daily as needed for anxiety.    [provider]  clotrimazole (LOTRIMIN) 1 % cream Apply 1 application topically 2 (two) times daily. Patient taking differently: Apply 1 application topically as needed.  05/16/17   Arnetha Courser, MD  cyclobenzaprine (FLEXERIL) 5 MG tablet Take 5 mg by mouth at bedtime.  06/04/15   [provider]  DULoxetine (CYMBALTA) 60 MG capsule Take 60 mg by mouth 2 (two) times daily.  07/22/15   [provider]  econazole nitrate 1 % cream Apply 1 application topically daily.    [provider]  gabapentin (NEURONTIN) 300 MG capsule Take 600 mg by mouth 2 (two) times daily.     [provider]  HYDROcodone-acetaminophen (NORCO/VICODIN) 5-325 MG tablet One-half to one pill by mouth daily PRN ([facility]) 11/03/16   [provider]  Insulin Pen Needle (ULTICARE MINI PEN NEEDLES) 31G X 6 MM MISC FOR USE WITH SAXENDA PEN FOR DAILY INJECTIONS 06/21/17   Lada, Satira Anis, MD  liraglutide ([name]) 18 MG/3ML SOPN Inject 0.3 mLs (1.8 mg total) into the skin daily. 05/25/17   Lada, Satira Anis, MD  losartan (COZAAR) 100 MG tablet TAKE 1 TABLET BY MOUTH DAILY 04/25/17   Arnetha Courser, MD  naproxen (NAPROSYN) 500 MG tablet Take 500 mg by mouth 2 (two) times daily with a meal. 11/30/16   [provider]  oxyCODONE-acetaminophen (PERCOCET/ROXICET) 5-325 MG tablet  Take 2 tablets by mouth every 4 (four) hours as needed for severe pain. 09/23/17   Loney Hering, MD  traMADol (ULTRAM) 50 MG tablet Take 2 tablets by mouth at bedtime. 11/29/16   [provider]  umeclidinium bromide (INCRUSE ELLIPTA) 62.5 MCG/INH AEPB Inhale 1 puff into the lungs daily. 12/02/16   Arnetha Courser, MD  Vitamin D, Ergocalciferol, (DRISDOL) 50000 units CAPS capsule Take 1 capsule (50,000 Units total) by mouth every 7 (seven) days. 08/19/17 10/14/17  Arnetha Courser, MD    Allergies Hydrochlorothiazide and Lasix [furosemide]  Family History  Problem Relation Age of Onset  . Alcohol abuse Father   . Heart attack Father   .  Arthritis Mother   . Asthma Mother   . Mental illness Mother   . Thyroid disease Mother   . COPD Mother   . Heart disease Mother   . Congestive Heart Failure Mother   . Arthritis Brother   . Mental illness Brother   . Cancer Brother        non-hodkins lymphoma  . Alcohol abuse Sister   . Drug abuse Sister   . Mental illness Sister   . Mental illness Sister   . Fibromyalgia Sister   . Obesity Sister   . Pneumonia Sister   . Mental illness Sister   . Alcohol abuse Sister   . Drug abuse Sister   . Diabetes Neg Hx   . Stroke Neg Hx   . Breast cancer Neg Hx     Social History Social History   Tobacco Use  . Smoking status: Current Every Day Smoker    Packs/day: 0.50    Years: 41.00    Pack years: 20.50    Types: Cigarettes  . Smokeless tobacco: Never Used  Substance Use Topics  . Alcohol use: No    Alcohol/week: 0.0 oz    Comment: sober since October 2018  . Drug use: No    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    Review of Systems  Constitutional: No fever/chills Eyes: No visual changes. ENT: No sore throat. Cardiovascular: Denies chest pain. Respiratory: Denies shortness of breath. Gastrointestinal: No abdominal pain.  Genitourinary: Negative for dysuria. Musculoskeletal: left foot pain Skin:  Negative for rash. Neurological: Negative for headaches   ____________________________________________   PHYSICAL EXAM:  VITAL SIGNS: ED Triage Vitals  Enc Vitals Group     BP 09/22/17 2223 (!) 135/92     Pulse Rate 09/22/17 2223 65     Resp 09/22/17 2223 16     Temp 09/22/17 2223 97.8 F (36.6 C)     Temp Source 09/22/17 2223 Oral     SpO2 09/22/17 2223 97 %     Weight 09/22/17 2224 216 lb (98 kg)     Height 09/22/17 2224 5' 2.25" (1.581 m)     Head Circumference --      Peak Flow --      Pain Score 09/22/17 2224 5     Pain Loc --      Pain Edu? --      Excl. in [place]? --     Constitutional: Alert and oriented. Well appearing and in mild distress. Eyes: Conjunctivae are normal. PERRL. EOMI. Head: Atraumatic. Nose: No congestion/rhinnorhea. Mouth/Throat: Mucous membranes are moist.  Oropharynx non-erythematous. Cardiovascular: Normal rate, regular rhythm. Grossly normal heart sounds.  Good peripheral circulation. Respiratory: Normal respiratory effort.  No retractions. Lungs CTAB. Gastrointestinal: Soft and nontender. No distention.  Positive bowel sounds Musculoskeletal: Tenderness to palpation of left lateral foot with some mild soft tissue swelling, no discoloration. Neurologic:  Normal speech and language.  Skin:  Skin is warm, dry and intact.  Psychiatric: Mood and affect are normal.   ____________________________________________   LABS (all labs ordered are listed, but only abnormal results are displayed)  Labs Reviewed - No data to display ____________________________________________  EKG  None ____________________________________________  RADIOLOGY  ED MD interpretation: DG left foot: Minimally displaced fracture at the base of the right fifth metatarsal bone  Official radiology report(s): Dg Foot Complete Left  Result Date: 09/22/2017 CLINICAL DATA:  LEFT foot pain after injury EXAM: LEFT FOOT - COMPLETE 3+ VIEW COMPARISON:  None.  FINDINGS:  Minimally displaced fracture at the base of the RIGHT fifth metatarsal bone. Osseous structures of the RIGHT foot are otherwise intact and normal alignment. IMPRESSION: Minimally displaced fracture at the base of the RIGHT fifth metatarsal bone. Location and configuration suggests avulsion fracture rather than Jones fracture. Electronically Signed   By: Franki Cabot M.D.   On: 09/22/2017 22:56    ____________________________________________   PROCEDURES  Procedure(s) performed: please, see procedure note(s).  .Splint Application Date/Time: 0/62/3762 1:30 AM Performed by: Loney Hering, MD Authorized by: Loney Hering, MD   Consent:    Consent obtained:  Verbal   Consent given by:  Patient   Risks discussed:  Discoloration, numbness and pain Pre-procedure details:    Sensation:  Normal   Skin color:  Pink Procedure details:    Laterality:  Left   Location:  Foot   Cast type:  Short leg   Splint type:  Short leg   Supplies:  Ortho-Glass Post-procedure details:    Pain:  Unchanged   Sensation:  Normal   Patient tolerance of procedure:  Tolerated well, no immediate complications    Critical Care performed: No  ____________________________________________   INITIAL IMPRESSION / ASSESSMENT AND PLAN / ED COURSE  As part of my medical decision making, I reviewed the following data within the electronic MEDICAL RECORD NUMBER Notes from prior ED visits and [place] Controlled Substance Database   This is a 57 year old female who comes into the hospital today with some foot pain after stepping on it wrong.  The patient did receive an x-ray looking for fracture and it was found that the patient had a fracture at the base of her fifth left metatarsal.  The patient was given a shot of morphine and Toradol for her pain.  The patient then received a posterior short leg splint on her left lower extremity.  The patient was able to move her toes without any problems and had good  sensation.  She will be discharged home to follow-up with her orthopedic surgeon.      ____________________________________________   FINAL CLINICAL IMPRESSION(S) / ED DIAGNOSES  Final diagnoses:  Nondisplaced fracture of fifth metatarsal bone, left foot, initial encounter for closed fracture     ED Discharge Orders        Ordered    oxyCODONE-acetaminophen (PERCOCET/ROXICET) 5-325 MG tablet  Every 4 hours PRN     09/23/17 0200       Note:  This document was prepared using Dragon voice recognition software and may include unintentional dictation errors.    Loney Hering, MD 09/23/17 [phone]

## 2017-09-23 NOTE — Discharge Instructions (Addendum)
Please follow up with orthopedic surgery for further evaluation of your foot fracture.

## 2017-09-23 NOTE — ED Notes (Signed)
This RN reviewed discharge instructions, follow-up care, prescriptions, cryotherapy, crutch use, splint care, and need for elevation with patient. Patient verbalized understanding of all reviewed information.  Patient stable, with no distress noted at this time.

## 2017-09-23 NOTE — ED Notes (Signed)
ED Provider at bedside. 

## 2017-09-25 DIAGNOSIS — S92355A Nondisplaced fracture of fifth metatarsal bone, left foot, initial encounter for closed fracture: Secondary | ICD-10-CM | POA: Diagnosis not present

## 2017-09-25 DIAGNOSIS — M79671 Pain in right foot: Secondary | ICD-10-CM | POA: Diagnosis not present

## 2017-09-25 DIAGNOSIS — S92421A Displaced fracture of distal phalanx of right great toe, initial encounter for closed fracture: Secondary | ICD-10-CM | POA: Diagnosis not present

## 2017-09-26 ENCOUNTER — Other Ambulatory Visit: Payer: Self-pay | Admitting: Family Medicine

## 2017-09-27 DIAGNOSIS — F411 Generalized anxiety disorder: Secondary | ICD-10-CM | POA: Diagnosis not present

## 2017-09-29 ENCOUNTER — Other Ambulatory Visit: Payer: Self-pay | Admitting: Family Medicine

## 2017-09-29 DIAGNOSIS — I10 Essential (primary) hypertension: Secondary | ICD-10-CM

## 2017-09-29 MED ORDER — ATENOLOL 25 MG PO TABS: 25.0000 mg | ORAL_TABLET | Freq: Every day | ORAL | 3 refills | Status: DC

## 2017-09-29 MED ORDER — LOSARTAN POTASSIUM 100 MG PO TABS: 100.0000 mg | ORAL_TABLET | Freq: Every day | ORAL | 3 refills | Status: DC

## 2017-09-29 NOTE — Progress Notes (Signed)
Request for 90 day supplies; sent

## 2017-10-04 DIAGNOSIS — Z79899 Other long term (current) drug therapy: Secondary | ICD-10-CM | POA: Diagnosis not present

## 2017-10-11 ENCOUNTER — Telehealth: Payer: Self-pay | Admitting: Gastroenterology

## 2017-10-11 ENCOUNTER — Other Ambulatory Visit: Payer: Self-pay

## 2017-10-11 MED ORDER — NA SULFATE-K SULFATE-MG SULF 17.5-3.13-1.6 GM/177ML PO SOLN: 1.0000 | Freq: Once | ORAL | 0 refills | Status: AC

## 2017-10-11 NOTE — Telephone Encounter (Signed)
Informed patient that her rx for Suprep has been faxed to [facility] as she requested.  Thanks [employer]

## 2017-10-11 NOTE — Telephone Encounter (Signed)
Patient LVM that her prep has not been called in. Please call it into [facility] on [address] And call the patient and let her know. You may leave a message

## 2017-10-13 ENCOUNTER — Encounter: Payer: Self-pay | Admitting: *Deleted

## 2017-10-16 ENCOUNTER — Encounter
Admission: RE | Disposition: A | Discharge status: Home / Self Care | Payer: Self-pay | Source: Ambulatory Visit | Attending: Gastroenterology

## 2017-10-16 ENCOUNTER — Ambulatory Visit: Payer: Medicare HMO | Admitting: Anesthesiology

## 2017-10-16 ENCOUNTER — Ambulatory Visit
Admission: RE | Admit: 2017-10-16 | Discharge: 2017-10-16 | Disposition: A | Discharge status: Home / Self Care | Payer: Medicare HMO | Source: Ambulatory Visit | Attending: Gastroenterology | Admitting: Gastroenterology

## 2017-10-16 DIAGNOSIS — G4733 Obstructive sleep apnea (adult) (pediatric): Secondary | ICD-10-CM | POA: Insufficient documentation

## 2017-10-16 DIAGNOSIS — J45909 Unspecified asthma, uncomplicated: Secondary | ICD-10-CM | POA: Diagnosis not present

## 2017-10-16 DIAGNOSIS — D126 Benign neoplasm of colon, unspecified: Secondary | ICD-10-CM | POA: Diagnosis not present

## 2017-10-16 DIAGNOSIS — Z791 Long term (current) use of non-steroidal anti-inflammatories (NSAID): Secondary | ICD-10-CM | POA: Diagnosis not present

## 2017-10-16 DIAGNOSIS — K635 Polyp of colon: Secondary | ICD-10-CM | POA: Diagnosis not present

## 2017-10-16 DIAGNOSIS — F419 Anxiety disorder, unspecified: Secondary | ICD-10-CM | POA: Diagnosis not present

## 2017-10-16 DIAGNOSIS — F17210 Nicotine dependence, cigarettes, uncomplicated: Secondary | ICD-10-CM | POA: Diagnosis not present

## 2017-10-16 DIAGNOSIS — R198 Other specified symptoms and signs involving the digestive system and abdomen: Secondary | ICD-10-CM

## 2017-10-16 DIAGNOSIS — M1990 Unspecified osteoarthritis, unspecified site: Secondary | ICD-10-CM | POA: Diagnosis not present

## 2017-10-16 DIAGNOSIS — E039 Hypothyroidism, unspecified: Secondary | ICD-10-CM | POA: Diagnosis not present

## 2017-10-16 DIAGNOSIS — Z888 Allergy status to other drugs, medicaments and biological substances status: Secondary | ICD-10-CM | POA: Insufficient documentation

## 2017-10-16 DIAGNOSIS — Z8614 Personal history of Methicillin resistant Staphylococcus aureus infection: Secondary | ICD-10-CM | POA: Diagnosis not present

## 2017-10-16 DIAGNOSIS — Z79899 Other long term (current) drug therapy: Secondary | ICD-10-CM | POA: Diagnosis not present

## 2017-10-16 DIAGNOSIS — I10 Essential (primary) hypertension: Secondary | ICD-10-CM | POA: Insufficient documentation

## 2017-10-16 DIAGNOSIS — F329 Major depressive disorder, single episode, unspecified: Secondary | ICD-10-CM | POA: Insufficient documentation

## 2017-10-16 DIAGNOSIS — R194 Change in bowel habit: Secondary | ICD-10-CM | POA: Diagnosis not present

## 2017-10-16 DIAGNOSIS — K219 Gastro-esophageal reflux disease without esophagitis: Secondary | ICD-10-CM | POA: Insufficient documentation

## 2017-10-16 DIAGNOSIS — D125 Benign neoplasm of sigmoid colon: Secondary | ICD-10-CM

## 2017-10-16 DIAGNOSIS — D122 Benign neoplasm of ascending colon: Secondary | ICD-10-CM | POA: Insufficient documentation

## 2017-10-16 HISTORY — PX: COLONOSCOPY WITH PROPOFOL: SHX5780

## 2017-10-16 LAB — GLUCOSE, CAPILLARY: Glucose-Capillary: 92 mg/dL (ref 65–99)

## 2017-10-16 SURGERY — COLONOSCOPY WITH PROPOFOL
Anesthesia: General

## 2017-10-16 MED ORDER — LIDOCAINE HCL (CARDIAC) PF 100 MG/5ML IV SOSY
PREFILLED_SYRINGE | INTRAVENOUS | Status: DC | PRN
  Administered 2017-10-16: 50 mg via INTRAVENOUS

## 2017-10-16 MED ORDER — PROPOFOL 500 MG/50ML IV EMUL
INTRAVENOUS | Status: DC | PRN
  Administered 2017-10-16: 180 ug/kg/min via INTRAVENOUS

## 2017-10-16 MED ORDER — SODIUM CHLORIDE 0.9 % IV SOLN
INTRAVENOUS | Status: DC
  Administered 2017-10-16: 08:00:00 via INTRAVENOUS

## 2017-10-16 MED ORDER — PROPOFOL 500 MG/50ML IV EMUL
INTRAVENOUS | Status: AC
  Filled 2017-10-16: qty 50

## 2017-10-16 MED ORDER — PROPOFOL 10 MG/ML IV BOLUS
INTRAVENOUS | Status: DC | PRN
  Administered 2017-10-16: 20 mg via INTRAVENOUS
  Administered 2017-10-16: 30 mg via INTRAVENOUS
  Administered 2017-10-16: 50 mg via INTRAVENOUS

## 2017-10-16 MED ORDER — LIDOCAINE HCL (PF) 2 % IJ SOLN
INTRAMUSCULAR | Status: AC
  Filled 2017-10-16: qty 10

## 2017-10-16 NOTE — Op Note (Signed)
[facility] Gastroenterology Patient Name: Susan [name] Procedure Date: 10/16/2017 8:31 AM MRN: [id number] Account #: [id number] Date of Birth: [birth year] Admit Type: Outpatient Age: 57 Room: [facility] ENDO ROOM 4 Gender: Female Note Status: Finalized Procedure:            Colonoscopy Indications:          Change in bowel habits Providers:            Jonathon Bellows MD, MD Referring MD:         Arnetha Courser (Referring MD) Medicines:            Monitored Anesthesia Care Complications:        No immediate complications. Procedure:            Pre-Anesthesia Assessment:                       - Prior to the procedure, a History and Physical was                        performed, and patient medications, allergies and                        sensitivities were reviewed. The patient's tolerance of                        previous anesthesia was reviewed.                       - The risks and benefits of the procedure and the                        sedation options and risks were discussed with the                        patient. All questions were answered and informed                        consent was obtained.                       - ASA Grade Assessment: II - A patient with mild                        systemic disease.                       After obtaining informed consent, the colonoscope was                        passed under direct vision. Throughout the procedure,                        the patient's blood pressure, pulse, and oxygen                        saturations were monitored continuously. The                        Colonoscope was introduced through the anus and  advanced to the the cecum, identified by the                        appendiceal orifice, IC valve and transillumination.                        The colonoscopy was performed with ease. The patient                        tolerated the procedure well. The quality of the bowel                    preparation was poor. Findings:      The perianal and digital rectal examinations were normal.      Three sessile polyps were found in the ascending colon. The polyps were       4 to 6 mm in size. These polyps were removed with a cold snare.       Resection and retrieval were complete.      A 5 mm polyp was found in the sigmoid colon. The polyp was sessile. The       polyp was removed with a cold snare. Resection and retrieval were       complete.      The exam was otherwise without abnormality on direct and retroflexion       views. Impression:           - Preparation of the colon was poor.                       - Three 4 to 6 mm polyps in the ascending colon,                        removed with a cold snare. Resected and retrieved.                       - One 5 mm polyp in the sigmoid colon, removed with a                        cold snare. Resected and retrieved.                       - The examination was otherwise normal on direct and                        retroflexion views. Recommendation:       - Discharge patient to home (with escort).                       - Resume previous diet.                       - Continue present medications.                       - Await pathology results.                       - Repeat colonoscopy in 6 months because the bowel                        preparation was suboptimal. Procedure  Code(s):    --- Professional ---                       [phone], Colonoscopy, flexible; with removal of tumor(s),                        polyp(s), or other lesion(s) by snare technique Diagnosis Code(s):    --- Professional ---                       D12.2, Benign neoplasm of ascending colon                       R19.4, Change in bowel habit                       D12.5, Benign neoplasm of sigmoid colon CPT copyright 2017 American Medical Association. All rights reserved. The codes documented in this report are preliminary and upon coder review may  be  revised to meet current compliance requirements. Jonathon Bellows, MD Jonathon Bellows MD, MD 10/16/2017 8:59:36 AM This report has been signed electronically. Number of Addenda: 0 Note Initiated On: 10/16/2017 8:31 AM Scope Withdrawal Time: 0 hours 15 minutes 5 seconds  Total Procedure Duration: 0 hours 20 minutes 22 seconds       [facility]

## 2017-10-16 NOTE — Anesthesia Preprocedure Evaluation (Signed)
Anesthesia Evaluation  Patient identified by MRN, date of birth, ID band Patient awake    Reviewed: Allergy & Precautions, H&P , NPO status , Patient's Chart, lab work & pertinent test results, reviewed documented beta blocker date and time   History of Anesthesia Complications Negative for: history of anesthetic complications  Airway Mallampati: I  TM Distance: >3 FB Neck ROM: full    Dental  (+) Partial Upper, Partial Lower, Missing, Dental Advidsory Given   Pulmonary neg pulmonary ROS, neg shortness of breath, asthma , sleep apnea and Continuous Positive Airway Pressure Ventilation , neg COPD, Recent URI , Residual Cough, Current Smoker,           Cardiovascular Exercise Tolerance: Good hypertension, (-) angina(-) CAD and (-) Past MI (-) dysrhythmias (-) Valvular Problems/Murmurs     Neuro/Psych PSYCHIATRIC DISORDERS Anxiety Depression Bipolar Disorder negative neurological ROS     GI/Hepatic GERD  ,(+) Hepatitis - (s/p treatment), C  Endo/Other  neg diabetesHyperthyroidism   Renal/GU negative Renal ROS  negative genitourinary   Musculoskeletal   Abdominal   Peds  Hematology negative hematology ROS (+)   Anesthesia Other Findings Past Medical History: No date: Anxiety No date: Arthritis     Comment:  rheumatoid arthritis No date: Asthma No date: Bipolar disorder ([place]) 09/28/2016: Degenerative disc disease at L5-S1 level     Comment:  See ortho note May 2018 No date: Depression     Comment:  bipolar, hx of suicide attempt No date: Dyspnea     Comment:  with exertion No date: GERD (gastroesophageal reflux disease) No date: H/O suicide attempt     Comment:  slit wrists No date: History of alcohol abuse 2013: History of diverticulitis No date: History of hepatitis C     Comment:  HEP "C"--three years ago 2013: History of MRSA infection No date: Hypertension No date: Hypothyroidism 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 Manuella Ghazi No date: Osteoporosis 09/24/2015: Post-traumatic osteoarthritis of right knee No date: Sleep apnea     Comment:   Dx 3 years ago. Use C-PAP No date: Thyroid disease     Comment:  Goiter No date: Vitamin B12 deficiency No date: Vitamin D deficiency disease   Reproductive/Obstetrics negative OB ROS                             Anesthesia Physical Anesthesia Plan  ASA: III  Anesthesia Plan: General   Post-op Pain Management:    Induction: Intravenous  PONV Risk Score and Plan: 2 and Propofol infusion  Airway Management Planned: Nasal Cannula  Additional Equipment:   Intra-op Plan:   Post-operative Plan:   Informed Consent: I have reviewed the patients History and Physical, chart, labs and discussed the procedure including the risks, benefits and alternatives for the proposed anesthesia with the patient or authorized representative who has indicated his/her understanding and acceptance.   Dental Advisory Given  Plan Discussed with: Anesthesiologist, CRNA and Surgeon  Anesthesia Plan Comments:         Anesthesia Quick Evaluation

## 2017-10-16 NOTE — Anesthesia Procedure Notes (Signed)
Date/Time: 10/16/2017 8:33 AM Performed by: [name], CRNA Pre-anesthesia Checklist: Patient identified, Emergency Drugs available, Suction available, Patient being monitored and Timeout performed Patient Re-evaluated:Patient Re-evaluated prior to induction Oxygen Delivery Method: Nasal cannula Preoxygenation: Pre-oxygenation with 100% oxygen

## 2017-10-16 NOTE — Anesthesia Postprocedure Evaluation (Signed)
Anesthesia Post Note  Patient: Alajah Witman Boulter  Procedure(s) Performed: COLONOSCOPY WITH PROPOFOL (N/A )  Patient location during evaluation: Endoscopy Anesthesia Type: General Level of consciousness: awake and alert Pain management: pain level controlled Vital Signs Assessment: post-procedure vital signs reviewed and stable Respiratory status: spontaneous breathing, nonlabored ventilation, respiratory function stable and patient connected to nasal cannula oxygen Cardiovascular status: blood pressure returned to baseline and stable Postop Assessment: no apparent nausea or vomiting Anesthetic complications: no     Last Vitals:  Vitals:   10/16/17 0922 10/16/17 0932  BP: 121/76 110/69  Pulse: 63 60  Resp: 17 18  Temp:    SpO2: 99% 100%    Last Pain:  Vitals:   10/16/17 0932  TempSrc:   PainSc: 0-No pain                 Martha Clan

## 2017-10-16 NOTE — Anesthesia Post-op Follow-up Note (Signed)
Anesthesia QCDR form completed.        

## 2017-10-16 NOTE — Transfer of Care (Signed)
Immediate Anesthesia Transfer of Care Note  Patient: Susan [name]  Procedure(s) Performed: COLONOSCOPY WITH PROPOFOL (N/A )  Patient Location: PACU  Anesthesia Type:General  Level of Consciousness: drowsy  Airway & Oxygen Therapy: Patient Spontanous Breathing and Patient connected to nasal cannula oxygen  Post-op Assessment: Report given to RN and Post -op Vital signs reviewed and stable  Post vital signs: Reviewed and stable  Last Vitals:  Vitals Value Taken Time  BP 87/53 10/16/2017  9:04 AM  Temp 36.2 C 10/16/2017  9:02 AM  Pulse 76 10/16/2017  9:04 AM  Resp 18 10/16/2017  9:04 AM  SpO2 99 % 10/16/2017  9:04 AM  Vitals shown include unvalidated device data.  Last Pain:  Vitals:   10/16/17 0902  TempSrc: Tympanic  PainSc: 0-No pain         Complications: No apparent anesthesia complications

## 2017-10-16 NOTE — H&P (Signed)
Jonathon Bellows, MD [address], [place], [place], [place], [address] [address], [place], [place], [place], [address] Phone: [phone]  Fax: [phone]  Primary Care Physician:  Arnetha Courser, MD   Pre-Procedure History & Physical: HPI:  Susan [name] is a 57 y.o. female is here for an colonoscopy.   Past Medical History:  Diagnosis Date  . Anxiety   . Arthritis    rheumatoid arthritis  . Asthma   . Bipolar disorder ([place])   . Degenerative disc disease at L5-S1 level 09/28/2016   See ortho note May 2018  . Depression    bipolar, hx of suicide attempt  . Dyspnea    with exertion  . GERD (gastroesophageal reflux disease)   . H/O suicide attempt    slit wrists  . History of alcohol abuse   . History of diverticulitis 2013  . History of hepatitis C    HEP "C"--three years ago  . History of MRSA infection 2013  . Hypertension   . Hypothyroidism   . 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 Multinodular thyroid   . OSA (obstructive sleep apnea)    managed by Dr. Manuella Ghazi  . Osteoporosis   . Post-traumatic osteoarthritis of right knee 09/24/2015  . Sleep apnea     Dx 3 years ago. Use C-PAP  . Thyroid disease    Goiter  . Vitamin B12 deficiency   . Vitamin D deficiency disease     Past Surgical History:  Procedure Laterality Date  . BILATERAL SALPINGOOPHORECTOMY     due to abnormal mass  . BREAST BIOPSY Left    neg  . BREAST SURGERY Left 20 yrs ago  . CESAREAN SECTION    . COLONOSCOPY    . HERNIA REPAIR  06/2011, July 2014   Ventral wall repair with Physiomesh  . TONSILLECTOMY    . TOTAL KNEE ARTHROPLASTY Right 05/10/2016   Procedure: TOTAL KNEE ARTHROPLASTY;  Surgeon: Corky Mull, MD;  Location: [facility] ORS;  Service: Orthopedics;  Laterality: Right;  . TUBAL LIGATION      Prior to Admission medications   Medication Sig Start Date End Date Taking? Authorizing Provider  albuterol (PROVENTIL HFA;VENTOLIN HFA) 108 (90 Base) MCG/ACT inhaler Inhale 2-4 puffs by mouth every  4 hours as needed for wheezing, cough, and/or shortness of breath 12/16/16  Yes Hinda Kehr, MD  amLODipine (NORVASC) 5 MG tablet Take 1 tablet (5 mg total) by mouth daily. 05/16/17  Yes Lada, Satira Anis, MD  atenolol (TENORMIN) 25 MG tablet Take 1 tablet (25 mg total) by mouth daily. 09/29/17  Yes Lada, Satira Anis, MD  atorvastatin (LIPITOR) 10 MG tablet Take 1 tablet (10 mg total) by mouth at bedtime. 05/17/17  Yes Lada, Satira Anis, MD  cyclobenzaprine (FLEXERIL) 5 MG tablet Take 5 mg by mouth at bedtime.  06/04/15  Yes [provider]  DULoxetine (CYMBALTA) 60 MG capsule Take 60 mg by mouth 2 (two) times daily.  07/22/15  Yes [provider]  econazole nitrate 1 % cream Apply 1 application topically daily.   Yes [provider]  gabapentin (NEURONTIN) 300 MG capsule Take 600 mg by mouth 2 (two) times daily.    Yes [provider]  HYDROcodone-acetaminophen (NORCO/VICODIN) 5-325 MG tablet One-half to one pill by mouth daily PRN ([facility]) 11/03/16  Yes [provider]  liraglutide ([name]) 18 MG/3ML SOPN Inject 0.3 mLs (1.8 mg total) into the skin daily. 05/25/17  Yes Lada, Satira Anis, MD  losartan (COZAAR) 100 MG tablet Take 1 tablet (100 mg total) by mouth daily. 09/29/17  Yes Lada, Satira Anis, MD  umeclidinium bromide (INCRUSE ELLIPTA) 62.5 MCG/INH AEPB Inhale 1 puff into the lungs daily. 12/02/16  Yes Lada, Satira Anis, MD  clonazePAM (KLONOPIN) 0.5 MG tablet Take 0.5 mg by mouth 3 (three) times daily as needed for anxiety.    [provider]  clotrimazole (LOTRIMIN) 1 % cream Apply 1 application topically 2 (two) times daily. Patient taking differently: Apply 1 application topically as needed.  05/16/17   Lada, Satira Anis, MD  Insulin Pen Needle (ULTICARE MINI PEN NEEDLES) 31G X 6 MM MISC FOR USE WITH SAXENDA PEN FOR DAILY INJECTIONS 09/26/17   Arnetha Courser, MD  naproxen (NAPROSYN) 500 MG tablet Take 500 mg by mouth 2 (two) times daily with a meal. 11/30/16    [provider]  oxyCODONE-acetaminophen (PERCOCET/ROXICET) 5-325 MG tablet Take 2 tablets by mouth every 4 (four) hours as needed for severe pain. Patient not taking: Reported on 10/16/2017 09/23/17   Loney Hering, MD  traMADol (ULTRAM) 50 MG tablet Take 2 tablets by mouth at bedtime. 11/29/16   [provider]    Allergies as of 09/21/2017 - Review Complete 09/18/2017  Allergen Reaction Noted  . Hydrochlorothiazide Other (See Comments) 06/26/2014  . Lasix [furosemide]  11/08/2012    Family History  Problem Relation Age of Onset  . Alcohol abuse Father   . Heart attack Father   . Arthritis Mother   . Asthma Mother   . Mental illness Mother   . Thyroid disease Mother   . COPD Mother   . Heart disease Mother   . Congestive Heart Failure Mother   . Arthritis Brother   . Mental illness Brother   . Cancer Brother        non-hodkins lymphoma  . Alcohol abuse Sister   . Drug abuse Sister   . Mental illness Sister   . Mental illness Sister   . Fibromyalgia Sister   . Obesity Sister   . Pneumonia Sister   . Mental illness Sister   . Alcohol abuse Sister   . Drug abuse Sister   . Diabetes Neg Hx   . Stroke Neg Hx   . Breast cancer Neg Hx     Social History   Socioeconomic History  . Marital status: Widowed    Spouse name: Not on file  . Number of children: Not on file  . Years of education: Not on file  . Highest education level: Not on file  Occupational History  . Not on file  Social Needs  . Financial resource strain: Not on file  . Food insecurity:    Worry: Not on file    Inability: Not on file  . Transportation needs:    Medical: Not on file    Non-medical: Not on file  Tobacco Use  . Smoking status: Current Every Day Smoker    Packs/day: 0.50    Years: 41.00    Pack years: 20.50    Types: Cigarettes  . Smokeless tobacco: Never Used  Substance and Sexual Activity  . Alcohol use: No    Alcohol/week: 0.0 oz    Comment: sober since  October 2018  . 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 Sexual activity: Not Currently  Lifestyle  . Physical activity:    Days per week: Not on file    Minutes  per session: Not on file  . Stress: Not on file  Relationships  . Social connections:    Talks on phone: Not on file    Gets together: Not on file    Attends religious service: Not on file    Active member of club or organization: Not on file    Attends meetings of clubs or organizations: Not on file    Relationship status: Not on file  . Intimate partner violence:    Fear of current or ex partner: Not on file    Emotionally abused: Not on file    Physically abused: Not on file    Forced sexual activity: Not on file  Other Topics Concern  . Not on file  Social History Narrative  . Not on file    Review of Systems: See HPI, otherwise negative ROS  Physical Exam: There were no vitals taken for this visit. General:   Alert,  pleasant and cooperative in NAD Head:  Normocephalic and atraumatic. Neck:  Supple; no masses or thyromegaly. Lungs:  Clear throughout to auscultation, normal respiratory effort.    Heart:  +S1, +S2, Regular rate and rhythm, No edema. Abdomen:  Soft, nontender and nondistended. Normal bowel sounds, without guarding, and without rebound.   Neurologic:  Alert and  oriented x4;  grossly normal neurologically.  Impression/Plan: Blanchard Mane Russum is here for an colonoscopy to be performed for change in bowel habits.  Risks, benefits, limitations, and alternatives regarding  colonoscopy have been reviewed with the patient.  Questions have been answered.  All parties agreeable.   Jonathon Bellows, MD  10/16/2017, 8:00 AM

## 2017-10-17 ENCOUNTER — Encounter: Payer: Self-pay | Admitting: Gastroenterology

## 2017-10-17 LAB — SURGICAL PATHOLOGY

## 2017-10-18 DIAGNOSIS — M79671 Pain in right foot: Secondary | ICD-10-CM | POA: Diagnosis not present

## 2017-10-18 DIAGNOSIS — S92424D Nondisplaced fracture of distal phalanx of right great toe, subsequent encounter for fracture with routine healing: Secondary | ICD-10-CM | POA: Diagnosis not present

## 2017-10-18 DIAGNOSIS — S92355D Nondisplaced fracture of fifth metatarsal bone, left foot, subsequent encounter for fracture with routine healing: Secondary | ICD-10-CM | POA: Diagnosis not present

## 2017-10-18 DIAGNOSIS — M79672 Pain in left foot: Secondary | ICD-10-CM | POA: Diagnosis not present

## 2017-10-30 ENCOUNTER — Telehealth: Payer: Self-pay | Admitting: Gastroenterology

## 2017-10-30 NOTE — Telephone Encounter (Signed)
*  STAT* If patient is at the pharmacy, call can be transferred to refill team.   1. Which medications need to be refilled? (please list name of each medication and dose if known) Linzess 290 mg  2. Which pharmacy/location (including street and city if local pharmacy) is medication to be sent to?[facility]- 3. Do they need a 30 day or 90 day supply? 90 day

## 2017-10-31 ENCOUNTER — Other Ambulatory Visit: Payer: Self-pay

## 2017-10-31 DIAGNOSIS — K581 Irritable bowel syndrome with constipation: Secondary | ICD-10-CM

## 2017-10-31 MED ORDER — LINACLOTIDE 290 MCG PO CAPS: 290.0000 ug | ORAL_CAPSULE | Freq: Every day | ORAL | 2 refills | Status: DC

## 2017-11-01 DIAGNOSIS — M5136 Other intervertebral disc degeneration, lumbar region: Secondary | ICD-10-CM | POA: Diagnosis not present

## 2017-11-01 DIAGNOSIS — M48062 Spinal stenosis, lumbar region with neurogenic claudication: Secondary | ICD-10-CM | POA: Diagnosis not present

## 2017-11-01 DIAGNOSIS — M5416 Radiculopathy, lumbar region: Secondary | ICD-10-CM | POA: Diagnosis not present

## 2017-11-01 DIAGNOSIS — M62830 Muscle spasm of back: Secondary | ICD-10-CM | POA: Diagnosis not present

## 2017-11-10 ENCOUNTER — Other Ambulatory Visit: Payer: Self-pay

## 2017-11-10 DIAGNOSIS — I10 Essential (primary) hypertension: Secondary | ICD-10-CM

## 2017-11-10 NOTE — Telephone Encounter (Signed)
I prescribed 90+3 refills of losartan to the same pharmacy on 09/29/17; please resolve with pharmacy; she should not need any further refills approved by me until May 2020

## 2017-11-14 ENCOUNTER — Other Ambulatory Visit: Payer: Self-pay

## 2017-11-14 DIAGNOSIS — I10 Essential (primary) hypertension: Secondary | ICD-10-CM

## 2017-11-14 DIAGNOSIS — K581 Irritable bowel syndrome with constipation: Secondary | ICD-10-CM

## 2017-11-14 MED ORDER — LIRAGLUTIDE 18 MG/3ML ~~LOC~~ SOPN: 1.8000 mg | PEN_INJECTOR | Freq: Every day | SUBCUTANEOUS | 0 refills | Status: DC

## 2017-11-14 MED ORDER — AMLODIPINE BESYLATE 5 MG PO TABS: 5.0000 mg | ORAL_TABLET | Freq: Every day | ORAL | 1 refills | Status: DC

## 2017-11-14 MED ORDER — ATENOLOL 25 MG PO TABS: 25.0000 mg | ORAL_TABLET | Freq: Every day | ORAL | 1 refills | Status: DC

## 2017-11-14 MED ORDER — LOSARTAN POTASSIUM 100 MG PO TABS: 100.0000 mg | ORAL_TABLET | Freq: Every day | ORAL | 1 refills | Status: DC

## 2017-11-14 MED ORDER — ATORVASTATIN CALCIUM 10 MG PO TABS: 10.0000 mg | ORAL_TABLET | Freq: Every day | ORAL | 1 refills | Status: DC

## 2017-11-14 NOTE — Telephone Encounter (Signed)
New pharmacy; 90-day supply

## 2017-11-14 NOTE — Telephone Encounter (Signed)
  This requested drug was sent to [facility] instead of [facility].

## 2017-11-15 DIAGNOSIS — Z79899 Other long term (current) drug therapy: Secondary | ICD-10-CM | POA: Diagnosis not present

## 2017-11-15 DIAGNOSIS — S92424D Nondisplaced fracture of distal phalanx of right great toe, subsequent encounter for fracture with routine healing: Secondary | ICD-10-CM | POA: Diagnosis not present

## 2017-11-15 DIAGNOSIS — S92355D Nondisplaced fracture of fifth metatarsal bone, left foot, subsequent encounter for fracture with routine healing: Secondary | ICD-10-CM | POA: Diagnosis not present

## 2017-11-20 ENCOUNTER — Other Ambulatory Visit: Payer: Self-pay | Admitting: Family Medicine

## 2017-12-05 DIAGNOSIS — M5416 Radiculopathy, lumbar region: Secondary | ICD-10-CM | POA: Diagnosis not present

## 2017-12-05 DIAGNOSIS — M5136 Other intervertebral disc degeneration, lumbar region: Secondary | ICD-10-CM | POA: Diagnosis not present

## 2017-12-05 DIAGNOSIS — M48062 Spinal stenosis, lumbar region with neurogenic claudication: Secondary | ICD-10-CM | POA: Diagnosis not present

## 2017-12-09 ENCOUNTER — Telehealth: Payer: Self-pay

## 2017-12-09 NOTE — Telephone Encounter (Signed)
Current smoker 1 pk per day Would like scan around Sept 11th after 10:00 No new health issues

## 2017-12-11 ENCOUNTER — Telehealth: Payer: Self-pay | Admitting: *Deleted

## 2017-12-11 DIAGNOSIS — Z122 Encounter for screening for malignant neoplasm of respiratory organs: Secondary | ICD-10-CM

## 2017-12-11 DIAGNOSIS — Z87891 Personal history of nicotine dependence: Secondary | ICD-10-CM

## 2017-12-11 NOTE — Telephone Encounter (Signed)
Patient has been notified that annual lung cancer screening low dose CT scan is due currently or will be in near future. Confirmed that patient is within the age range of 55-77, and asymptomatic, (no signs or symptoms of lung cancer). Patient denies illness that would prevent curative treatment for lung cancer if found. Verified smoking history, (current, 42 pack year). The shared decision making visit was done 12/21/16. Patient is agreeable for CT scan being scheduled next month.

## 2017-12-22 DIAGNOSIS — R69 Illness, unspecified: Secondary | ICD-10-CM | POA: Diagnosis not present

## 2017-12-27 DIAGNOSIS — S92424D Nondisplaced fracture of distal phalanx of right great toe, subsequent encounter for fracture with routine healing: Secondary | ICD-10-CM | POA: Diagnosis not present

## 2017-12-27 DIAGNOSIS — F411 Generalized anxiety disorder: Secondary | ICD-10-CM | POA: Diagnosis not present

## 2017-12-27 DIAGNOSIS — M76821 Posterior tibial tendinitis, right leg: Secondary | ICD-10-CM | POA: Diagnosis not present

## 2017-12-27 DIAGNOSIS — S92355D Nondisplaced fracture of fifth metatarsal bone, left foot, subsequent encounter for fracture with routine healing: Secondary | ICD-10-CM | POA: Diagnosis not present

## 2018-01-03 DIAGNOSIS — M48062 Spinal stenosis, lumbar region with neurogenic claudication: Secondary | ICD-10-CM | POA: Diagnosis not present

## 2018-01-03 DIAGNOSIS — M5136 Other intervertebral disc degeneration, lumbar region: Secondary | ICD-10-CM | POA: Diagnosis not present

## 2018-01-03 DIAGNOSIS — M5416 Radiculopathy, lumbar region: Secondary | ICD-10-CM | POA: Diagnosis not present

## 2018-01-03 DIAGNOSIS — M62830 Muscle spasm of back: Secondary | ICD-10-CM | POA: Diagnosis not present

## 2018-01-03 DIAGNOSIS — Z79899 Other long term (current) drug therapy: Secondary | ICD-10-CM | POA: Diagnosis not present

## 2018-01-10 ENCOUNTER — Encounter: Payer: Self-pay | Admitting: Nurse Practitioner

## 2018-01-10 ENCOUNTER — Ambulatory Visit (INDEPENDENT_AMBULATORY_CARE_PROVIDER_SITE_OTHER): Payer: Medicare HMO | Admitting: Nurse Practitioner

## 2018-01-10 VITALS — BP 120/70 | HR 75 | Temp 98.3°F | Resp 16 | Ht 62.0 in | Wt 220.4 lb

## 2018-01-10 DIAGNOSIS — L02416 Cutaneous abscess of left lower limb: Secondary | ICD-10-CM

## 2018-01-10 DIAGNOSIS — Z23 Encounter for immunization: Secondary | ICD-10-CM

## 2018-01-10 MED ORDER — SULFAMETHOXAZOLE-TRIMETHOPRIM 800-160 MG PO TABS: 1.0000 | ORAL_TABLET | Freq: Two times a day (BID) | ORAL | 0 refills | Status: DC

## 2018-01-10 NOTE — Progress Notes (Signed)
Name: Susan [name]   MRN: [id number]    DOB: 1960-06-28   Date:01/10/2018       Progress Note  Subjective  Chief Complaint  Chief Complaint  Patient presents with  . Puncture Wound    left knee has redness, swelling and pus x 1 week.    HPI Patient states 9/28 was outside in the garden and noted left knee tenderness and felt a stinger or something in her left knee states then knee got really red and hot, and painful and started to drain pus. Thinks it is a spider bite. States the past 2 days redness and swelling have significantly decreased as she has been pushing out pus and spraying peroxide.   No fevers, chills, nausea, abdominal pain, diarrhea, chest pain, heart racing   Patient Active Problem List   Diagnosis Date Noted  . Encounter for screening for HIV 05/16/2017  . Dyslipidemia 03/28/2017  . Cocaine use disorder ([place]) 02/27/2017  . Low HDL (under 40) 10/14/2016  . Degenerative disc disease at L5-S1 level 09/28/2016  . 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 Tobacco abuse 04/21/2016  . Cracked skin on feet 01/19/2016  . Diastasis recti 12/18/2015  . GERD (gastroesophageal reflux disease) 12/17/2015  . Chronic back pain 10/09/2015  . Post-traumatic osteoarthritis of right knee 09/24/2015  . Morbid obesity ([place]) 08/06/2015  . Pain in right knee 08/03/2015  . Postmenopausal 07/08/2015  . Cervical cancer screening 07/08/2015  . Alcoholism in recovery ([place]) 04/13/2015  . Impaired fasting glucose 04/10/2015  . Need for Streptococcus pneumoniae and influenza vaccination 04/09/2015  . Vitamin D deficiency 04/09/2015  . Vitamin B12 deficiency 04/09/2015  . Medication monitoring encounter 04/09/2015  . OSA on CPAP 04/09/2015  . Breast cancer screening 04/09/2015  . Subclinical hyperthyroidism 06/26/2014  . Toxic multinodular goiter 06/26/2014  . Hypertension 06/26/2014  . Hepatitis C 06/26/2014  . Bipolar disorder ([place]) 06/26/2014  . Throat  dryness 05/20/2014  . Lumbar radiculitis 10/03/2013  . Diverticulosis 08/24/2013  . Recurrent ventral hernia 11/08/2012  . Incisional hernia 11/08/2012    Past Medical History:  Diagnosis Date  . Anxiety   . Arthritis    rheumatoid arthritis  . Asthma   . Bipolar disorder ([place])   . Degenerative disc disease at L5-S1 level 09/28/2016   See ortho note May 2018  . Depression    bipolar, hx of suicide attempt  . Dyspnea    with exertion  . GERD (gastroesophageal reflux disease)   . H/O suicide attempt    slit wrists  . History of alcohol abuse   . History of diverticulitis 2013  . History of hepatitis C    HEP "C"--three years ago  . History of MRSA infection 2013  . Hypertension   . Hypothyroidism   . 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 Multinodular thyroid   . OSA (obstructive sleep apnea)    managed by Dr. Manuella Ghazi  . Osteoporosis   . Post-traumatic osteoarthritis of right knee 09/24/2015  . Sleep apnea     Dx 3 years ago. Use C-PAP  . Thyroid disease    Goiter  . Vitamin B12 deficiency   . Vitamin D deficiency disease     Past Surgical History:  Procedure Laterality Date  . BILATERAL SALPINGOOPHORECTOMY     due to abnormal mass  . BREAST BIOPSY Left    neg  . BREAST SURGERY Left 20 yrs ago  . CESAREAN SECTION    .  COLONOSCOPY    . COLONOSCOPY WITH PROPOFOL N/A 10/16/2017   Procedure: COLONOSCOPY WITH PROPOFOL;  Surgeon: Jonathon Bellows, MD;  Location: [facility] ENDOSCOPY;  Service: Gastroenterology;  Laterality: N/A;  . HERNIA REPAIR  06/2011, July 2014   Ventral wall repair with Physiomesh  . TONSILLECTOMY    . TOTAL KNEE ARTHROPLASTY Right 05/10/2016   Procedure: TOTAL KNEE ARTHROPLASTY;  Surgeon: Corky Mull, MD;  Location: [facility] ORS;  Service: Orthopedics;  Laterality: Right;  . TUBAL LIGATION      Social History   Tobacco Use  . Smoking status: Current Every Day Smoker    Packs/day: 0.50    Years: 41.00    Pack years: 20.50    Types: Cigarettes  . Smokeless tobacco:  Never Used  Substance Use Topics  . Alcohol use: No    Alcohol/week: 0.0 standard drinks    Comment: sober since October 2018     Current Outpatient Medications:  .  albuterol (PROVENTIL HFA;VENTOLIN HFA) 108 (90 Base) MCG/ACT inhaler, Inhale 2-4 puffs by mouth every 4 hours as needed for wheezing, cough, and/or shortness of breath, Disp: 1 Inhaler, Rfl: 1 .  amLODipine (NORVASC) 5 MG tablet, Take 1 tablet (5 mg total) by mouth daily., Disp: 90 tablet, Rfl: 1 .  atenolol (TENORMIN) 25 MG tablet, Take 1 tablet (25 mg total) by mouth daily., Disp: 90 tablet, Rfl: 1 .  atorvastatin (LIPITOR) 10 MG tablet, Take 1 tablet (10 mg total) by mouth at bedtime., Disp: 90 tablet, Rfl: 1 .  clotrimazole (LOTRIMIN) 1 % cream, Apply 1 application topically 2 (two) times daily. (Patient taking differently: Apply 1 application topically as needed. ), Disp: 30 g, Rfl: 0 .  cyclobenzaprine (FLEXERIL) 5 MG tablet, Take 5 mg by mouth at bedtime. , Disp: , Rfl:  .  DULoxetine (CYMBALTA) 60 MG capsule, Take 60 mg by mouth 2 (two) times daily. , Disp: , Rfl:  .  econazole nitrate 1 % cream, Apply 1 application topically daily., Disp: , Rfl:  .  gabapentin (NEURONTIN) 300 MG capsule, Take 600 mg by mouth 2 (two) times daily. , Disp: , Rfl:  .  HYDROcodone-acetaminophen (NORCO/VICODIN) 5-325 MG tablet, One-half to one pill by mouth daily PRN ([facility]), Disp: , Rfl:  .  Insulin Pen Needle (ULTICARE MINI PEN NEEDLES) 31G X 6 MM MISC, FOR USE WITH SAXENDA PEN FOR DAILY INJECTIONS, Disp: 90 each, Rfl: 1 .  liraglutide ([name]) 18 MG/3ML SOPN, Inject 0.3 mLs (1.8 mg total) into the skin daily., Disp: 15 pen, Rfl: 0 .  losartan (COZAAR) 100 MG tablet, Take 1 tablet (100 mg total) by mouth daily., Disp: 90 tablet, Rfl: 1 .  naproxen (NAPROSYN) 500 MG tablet, Take 500 mg by mouth 2 (two) times daily with a meal., Disp: , Rfl:  .  traMADol (ULTRAM) 50 MG tablet, Take 2 tablets by mouth at bedtime., Disp: , Rfl:   Allergies    Allergen Reactions  . Hydrochlorothiazide Other (See Comments)    Electrolyte imbalance  . Lasix [Furosemide]     Electrolyte imbalance    ROS   No other specific complaints in a complete review of systems (except as listed in HPI above).  Objective  Vitals:   01/10/18 0813  BP: 120/70  Pulse: 75  Resp: 16  Temp: 98.3 F (36.8 C)  TempSrc: Oral  SpO2: 98%  Weight: 220 lb 6.4 oz (100 kg)  Height: 5\' 2"  (1.575 m)     Body mass index is 40.31  kg/m.  Nursing Note and Vital Signs reviewed.  Physical Exam  Constitutional: She appears well-developed.  Cardiovascular: Normal rate and regular rhythm.  Abdominal: Soft. Bowel sounds are normal. There is no tenderness.  Musculoskeletal: Normal range of motion. She exhibits edema.  Neurological: She is alert.  Skin: Skin is warm and dry.     Psychiatric: She has a normal mood and affect. Her behavior is normal. Judgment and thought content normal.      No results found for this or any previous visit (from the past 48 hour(s)).  Assessment & Plan  1. Abscess of left knee No systemic signs- per patient much improvement; will closely monitor discussed ER precautions in detail. Stop spraying peroxide.  - sulfamethoxazole-trimethoprim (BACTRIM DS,SEPTRA DS) 800-160 MG tablet; Take 1 tablet by mouth 2 (two) times daily.  Dispense: 20 tablet; Refill: 0 - Wound culture  2. Flu vaccine need - Flu Vaccine QUAD 6+ mos PF IM (Fluarix Quad PF)   -Red flags and when to present for emergency care or RTC including  fevers, chills, joint pain, body aches, nausea, vomiting, worsening redness or pain , new/worsening/un-resolving symptoms,  reviewed with patient at time of visit. Follow up and care instructions discussed and provided in AVS.

## 2018-01-10 NOTE — Patient Instructions (Signed)
-   please take bactrim every 12 hours with some food on your stomach - take an over the counter probiotic or eat activia yogurt every day for at least the next 2 weeks to replenish your good gut bacteria -  Come back tomorrow to ensure it is getting better; if you have any fevers, chills, joint pain, body aches, nausea, vomiting, worsening redness or pain please go to the ER  Skin Abscess A skin abscess is an infected area on or under your skin that contains pus and other material. An abscess can happen almost anywhere on your body. Some abscesses break open (rupture) on their own. Most continue to get worse unless they are treated. The infection can spread deeper into the body and into your blood, which can make you feel sick. Treatment usually involves draining the abscess. Follow these instructions at home: Abscess Care  If you have an abscess that has not drained, place a warm, clean, wet washcloth over the abscess several times a day. Do this as told by your doctor.  Follow instructions from your doctor about how to take care of your abscess. Make sure you: ? Cover the abscess with a bandage (dressing). ? Change your bandage or gauze as told by your doctor. ? Wash your hands with soap and water before you change the bandage or gauze. If you cannot use soap and water, use hand sanitizer.  Check your abscess every day for signs that the infection is getting worse. Check for: ? More redness, swelling, or pain. ? More fluid or blood. ? Warmth. ? More pus or a bad smell. Medicines   Take over-the-counter and prescription medicines only as told by your doctor.  If you were prescribed an antibiotic medicine, take it as told by your doctor. Do not stop taking the antibiotic even if you start to feel better. General instructions  To avoid spreading the infection: ? Do not share personal care items, towels, or hot tubs with others. ? Avoid making skin-to-skin contact with other  people.  Keep all follow-up visits as told by your doctor. This is important. Contact a doctor if:  You have more redness, swelling, or pain around your abscess.  You have more fluid or blood coming from your abscess.  Your abscess feels warm when you touch it.  You have more pus or a bad smell coming from your abscess.  You have a fever.  Your muscles ache.  You have chills.  You feel sick. Get help right away if:  You have very bad (severe) pain.  You see red streaks on your skin spreading away from the abscess. This information is not intended to replace advice given to you by your health care provider. Make sure you discuss any questions you have with your health care provider. Document Released: 10/12/2007 Document Revised: 12/20/2015 Document Reviewed: 03/04/2015 Elsevier Interactive Patient Education  [employer].

## 2018-01-11 ENCOUNTER — Encounter: Payer: Self-pay | Admitting: Nurse Practitioner

## 2018-01-11 ENCOUNTER — Ambulatory Visit (INDEPENDENT_AMBULATORY_CARE_PROVIDER_SITE_OTHER): Payer: Medicare HMO | Admitting: Nurse Practitioner

## 2018-01-11 VITALS — BP 120/70 | HR 73 | Temp 98.2°F | Resp 16 | Ht 62.0 in | Wt 220.4 lb

## 2018-01-11 DIAGNOSIS — L02416 Cutaneous abscess of left lower limb: Secondary | ICD-10-CM

## 2018-01-11 MED ORDER — NONFORMULARY OR COMPOUNDED ITEM: 1.0000 | Freq: Every day | 0 refills | Status: AC

## 2018-01-11 NOTE — Progress Notes (Addendum)
Name: Susan [name]   MRN: [id number]    DOB: 24-Sep-1960   Date:01/11/2018       Progress Note  Subjective  Chief Complaint  Chief Complaint  Patient presents with  . Wound Check    HPI  Patient presents for follow up of right knee abcess. Was started on bactrim and had had 3 doses since. Redness seems to have improved minimally, does not appear any worse. Patient denies fevers, chills, joint pain, body aches.   Patient Active Problem List   Diagnosis Date Noted  . Encounter for screening for HIV 05/16/2017  . Dyslipidemia 03/28/2017  . Cocaine use disorder ([place]) 02/27/2017  . Low HDL (under 40) 10/14/2016  . Degenerative disc disease at L5-S1 level 09/28/2016  . 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 Tobacco abuse 04/21/2016  . Cracked skin on feet 01/19/2016  . Diastasis recti 12/18/2015  . GERD (gastroesophageal reflux disease) 12/17/2015  . Chronic back pain 10/09/2015  . Post-traumatic osteoarthritis of right knee 09/24/2015  . Morbid obesity ([place]) 08/06/2015  . Pain in right knee 08/03/2015  . Postmenopausal 07/08/2015  . Cervical cancer screening 07/08/2015  . Alcoholism in recovery ([place]) 04/13/2015  . Impaired fasting glucose 04/10/2015  . Need for Streptococcus pneumoniae and influenza vaccination 04/09/2015  . Vitamin D deficiency 04/09/2015  . Vitamin B12 deficiency 04/09/2015  . Medication monitoring encounter 04/09/2015  . OSA on CPAP 04/09/2015  . Breast cancer screening 04/09/2015  . Subclinical hyperthyroidism 06/26/2014  . Toxic multinodular goiter 06/26/2014  . Hypertension 06/26/2014  . Hepatitis C 06/26/2014  . Bipolar disorder ([place]) 06/26/2014  . Throat dryness 05/20/2014  . Lumbar radiculitis 10/03/2013  . Diverticulosis 08/24/2013  . Recurrent ventral hernia 11/08/2012  . Incisional hernia 11/08/2012    Past Medical History:  Diagnosis Date  . Anxiety   . Arthritis    rheumatoid arthritis  . Asthma   .  Bipolar disorder ([place])   . Degenerative disc disease at L5-S1 level 09/28/2016   See ortho note May 2018  . Depression    bipolar, hx of suicide attempt  . Dyspnea    with exertion  . GERD (gastroesophageal reflux disease)   . H/O suicide attempt    slit wrists  . History of alcohol abuse   . History of diverticulitis 2013  . History of hepatitis C    HEP "C"--three years ago  . History of MRSA infection 2013  . Hypertension   . Hypothyroidism   . 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 Multinodular thyroid   . OSA (obstructive sleep apnea)    managed by Dr. Manuella Ghazi  . Osteoporosis   . Post-traumatic osteoarthritis of right knee 09/24/2015  . Sleep apnea     Dx 3 years ago. Use C-PAP  . Thyroid disease    Goiter  . Vitamin B12 deficiency   . Vitamin D deficiency disease     Past Surgical History:  Procedure Laterality Date  . BILATERAL SALPINGOOPHORECTOMY     due to abnormal mass  . BREAST BIOPSY Left    neg  . BREAST SURGERY Left 20 yrs ago  . CESAREAN SECTION    . COLONOSCOPY    . COLONOSCOPY WITH PROPOFOL N/A 10/16/2017   Procedure: COLONOSCOPY WITH PROPOFOL;  Surgeon: Jonathon Bellows, MD;  Location: [facility] ENDOSCOPY;  Service: Gastroenterology;  Laterality: N/A;  . HERNIA REPAIR  06/2011, July 2014   Ventral wall repair with Physiomesh  . TONSILLECTOMY    .  TOTAL KNEE ARTHROPLASTY Right 05/10/2016   Procedure: TOTAL KNEE ARTHROPLASTY;  Surgeon: Corky Mull, MD;  Location: [facility] ORS;  Service: Orthopedics;  Laterality: Right;  . TUBAL LIGATION      Social History   Tobacco Use  . Smoking status: Current Every Day Smoker    Packs/day: 0.50    Years: 41.00    Pack years: 20.50    Types: Cigarettes  . Smokeless tobacco: Never Used  Substance Use Topics  . Alcohol use: No    Alcohol/week: 0.0 standard drinks    Comment: sober since October 2018     Current Outpatient Medications:  .  albuterol (PROVENTIL HFA;VENTOLIN HFA) 108 (90 Base) MCG/ACT inhaler, Inhale 2-4 puffs by mouth  every 4 hours as needed for wheezing, cough, and/or shortness of breath, Disp: 1 Inhaler, Rfl: 1 .  amLODipine (NORVASC) 5 MG tablet, Take 1 tablet (5 mg total) by mouth daily., Disp: 90 tablet, Rfl: 1 .  atenolol (TENORMIN) 25 MG tablet, Take 1 tablet (25 mg total) by mouth daily., Disp: 90 tablet, Rfl: 1 .  atorvastatin (LIPITOR) 10 MG tablet, Take 1 tablet (10 mg total) by mouth at bedtime., Disp: 90 tablet, Rfl: 1 .  clotrimazole (LOTRIMIN) 1 % cream, Apply 1 application topically 2 (two) times daily. (Patient taking differently: Apply 1 application topically as needed. ), Disp: 30 g, Rfl: 0 .  cyclobenzaprine (FLEXERIL) 5 MG tablet, Take 5 mg by mouth at bedtime. , Disp: , Rfl:  .  DULoxetine (CYMBALTA) 60 MG capsule, Take 60 mg by mouth 2 (two) times daily. , Disp: , Rfl:  .  econazole nitrate 1 % cream, Apply 1 application topically daily., Disp: , Rfl:  .  gabapentin (NEURONTIN) 300 MG capsule, Take 600 mg by mouth 2 (two) times daily. , Disp: , Rfl:  .  HYDROcodone-acetaminophen (NORCO/VICODIN) 5-325 MG tablet, One-half to one pill by mouth daily PRN ([facility]), Disp: , Rfl:  .  Insulin Pen Needle (ULTICARE MINI PEN NEEDLES) 31G X 6 MM MISC, FOR USE WITH SAXENDA PEN FOR DAILY INJECTIONS, Disp: 90 each, Rfl: 1 .  liraglutide ([name]) 18 MG/3ML SOPN, Inject 0.3 mLs (1.8 mg total) into the skin daily., Disp: 15 pen, Rfl: 0 .  losartan (COZAAR) 100 MG tablet, Take 1 tablet (100 mg total) by mouth daily., Disp: 90 tablet, Rfl: 1 .  naproxen (NAPROSYN) 500 MG tablet, Take 500 mg by mouth 2 (two) times daily with a meal., Disp: , Rfl:  .  sulfamethoxazole-trimethoprim (BACTRIM DS,SEPTRA DS) 800-160 MG tablet, Take 1 tablet by mouth 2 (two) times daily., Disp: 20 tablet, Rfl: 0 .  traMADol (ULTRAM) 50 MG tablet, Take 2 tablets by mouth at bedtime., Disp: , Rfl:   Allergies  Allergen Reactions  . Hydrochlorothiazide Other (See Comments)    Electrolyte imbalance  . Lasix [Furosemide]      Electrolyte imbalance    ROS   No other specific complaints in a complete review of systems (except as listed in HPI above).  Objective  Vitals:   01/11/18 0857  BP: 120/70  Pulse: 73  Resp: 16  Temp: 98.2 F (36.8 C)  TempSrc: Oral  SpO2: 94%  Weight: 220 lb 6.4 oz (100 kg)  Height: 5\' 2"  (1.575 m)    Body mass index is 40.31 kg/m.  Nursing Note and Vital Signs reviewed.  Physical Exam  Constitutional: She is oriented to person, place, and time. She appears well-developed and well-nourished.  Cardiovascular: Normal rate, regular rhythm,  normal heart sounds and intact distal pulses.  Pulmonary/Chest: Effort normal and breath sounds normal.  Musculoskeletal: Normal range of motion.  Neurological: She is alert and oriented to person, place, and time.  Skin:  Left knee redness 7x5cm with open yellow area with central scab     No results found for this or any previous visit (from the past 48 hour(s)).  Assessment & Plan  1. Abscess of left knee Discussed ER precautions, no worse- minimal improvement, will follow up in one week.   - NONFORMULARY OR COMPOUNDED ITEM; Apply 1 Dose topically daily for 10 days.  Dispense: 1 each; Refill: 0 CHG soap cleanse body except for wound bed.

## 2018-01-11 NOTE — Patient Instructions (Addendum)
Please take the antibiotic for the entire course even if you feel better or wound resolves before your dose is completed.  Please take an over the counter probiotic daily for 2-3 weeks.  Please go to the ER with fever, chills, worsening redness, increased swelling, pain or drainage.

## 2018-01-12 LAB — WOUND CULTURE
MICRO NUMBER:: 91055916
SPECIMEN QUALITY:: ADEQUATE

## 2018-01-17 ENCOUNTER — Ambulatory Visit
Admission: RE | Admit: 2018-01-17 | Discharge: 2018-01-17 | Disposition: A | Discharge status: Home / Self Care | Payer: Medicare HMO | Source: Ambulatory Visit | Attending: Nurse Practitioner | Admitting: Nurse Practitioner

## 2018-01-17 DIAGNOSIS — Z122 Encounter for screening for malignant neoplasm of respiratory organs: Secondary | ICD-10-CM | POA: Diagnosis not present

## 2018-01-17 DIAGNOSIS — Z87891 Personal history of nicotine dependence: Secondary | ICD-10-CM | POA: Diagnosis not present

## 2018-01-17 DIAGNOSIS — J432 Centrilobular emphysema: Secondary | ICD-10-CM | POA: Diagnosis not present

## 2018-01-17 DIAGNOSIS — I700 Atherosclerosis of aorta: Secondary | ICD-10-CM | POA: Insufficient documentation

## 2018-01-17 DIAGNOSIS — F17210 Nicotine dependence, cigarettes, uncomplicated: Secondary | ICD-10-CM | POA: Diagnosis not present

## 2018-01-18 ENCOUNTER — Encounter: Payer: Self-pay | Admitting: *Deleted

## 2018-01-18 ENCOUNTER — Encounter: Payer: Self-pay | Admitting: Nurse Practitioner

## 2018-01-18 ENCOUNTER — Ambulatory Visit (INDEPENDENT_AMBULATORY_CARE_PROVIDER_SITE_OTHER): Payer: Medicare HMO | Admitting: Nurse Practitioner

## 2018-01-18 VITALS — BP 110/60 | HR 70 | Temp 98.4°F | Resp 16 | Ht 62.0 in | Wt 217.5 lb

## 2018-01-18 DIAGNOSIS — L02416 Cutaneous abscess of left lower limb: Secondary | ICD-10-CM

## 2018-01-18 MED ORDER — SULFAMETHOXAZOLE-TRIMETHOPRIM 800-160 MG PO TABS: 1.0000 | ORAL_TABLET | Freq: Two times a day (BID) | ORAL | 0 refills | Status: DC

## 2018-01-18 NOTE — Progress Notes (Signed)
Name: Susan [name]   MRN: [id number]    DOB: 1961/01/30   Date:01/18/2018       Progress Note  Subjective  Chief Complaint  Chief Complaint  Patient presents with  . Wound Check    left knee    HPI  Patient presents for left knee wound recheck- cultured to have MRSA. Has been on bactrim DS BID x 8 days now.  No knee pain, states did have some drainage today, redness has significantly decreased, and central area has gotten smaller. Denies fevers and chills, no missed doses of antibiotics.   Patient Active Problem List   Diagnosis Date Noted  . Encounter for screening for HIV 05/16/2017  . Dyslipidemia 03/28/2017  . Cocaine use disorder ([place]) 02/27/2017  . Low HDL (under 40) 10/14/2016  . Degenerative disc disease at L5-S1 level 09/28/2016  . 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 Tobacco abuse 04/21/2016  . Cracked skin on feet 01/19/2016  . Diastasis recti 12/18/2015  . GERD (gastroesophageal reflux disease) 12/17/2015  . Chronic back pain 10/09/2015  . Post-traumatic osteoarthritis of right knee 09/24/2015  . Morbid obesity ([place]) 08/06/2015  . Pain in right knee 08/03/2015  . Postmenopausal 07/08/2015  . Cervical cancer screening 07/08/2015  . Alcoholism in recovery ([place]) 04/13/2015  . Impaired fasting glucose 04/10/2015  . Need for Streptococcus pneumoniae and influenza vaccination 04/09/2015  . Vitamin D deficiency 04/09/2015  . Vitamin B12 deficiency 04/09/2015  . Medication monitoring encounter 04/09/2015  . OSA on CPAP 04/09/2015  . Breast cancer screening 04/09/2015  . Subclinical hyperthyroidism 06/26/2014  . Toxic multinodular goiter 06/26/2014  . Hypertension 06/26/2014  . Hepatitis C 06/26/2014  . Bipolar disorder ([place]) 06/26/2014  . Throat dryness 05/20/2014  . Lumbar radiculitis 10/03/2013  . Diverticulosis 08/24/2013  . Recurrent ventral hernia 11/08/2012  . Incisional hernia 11/08/2012    Past Medical History:   Diagnosis Date  . Anxiety   . Arthritis    rheumatoid arthritis  . Asthma   . Bipolar disorder ([place])   . Degenerative disc disease at L5-S1 level 09/28/2016   See ortho note May 2018  . Depression    bipolar, hx of suicide attempt  . Dyspnea    with exertion  . GERD (gastroesophageal reflux disease)   . H/O suicide attempt    slit wrists  . History of alcohol abuse   . History of diverticulitis 2013  . History of hepatitis C    HEP "C"--three years ago  . History of MRSA infection 2013  . Hypertension   . Hypothyroidism   . 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 Multinodular thyroid   . OSA (obstructive sleep apnea)    managed by Dr. Manuella Ghazi  . Osteoporosis   . Post-traumatic osteoarthritis of right knee 09/24/2015  . Sleep apnea     Dx 3 years ago. Use C-PAP  . Thyroid disease    Goiter  . Vitamin B12 deficiency   . Vitamin D deficiency disease     Past Surgical History:  Procedure Laterality Date  . BILATERAL SALPINGOOPHORECTOMY     due to abnormal mass  . BREAST BIOPSY Left    neg  . BREAST SURGERY Left 20 yrs ago  . CESAREAN SECTION    . COLONOSCOPY    . COLONOSCOPY WITH PROPOFOL N/A 10/16/2017   Procedure: COLONOSCOPY WITH PROPOFOL;  Surgeon: Jonathon Bellows, MD;  Location: [facility] ENDOSCOPY;  Service: Gastroenterology;  Laterality: N/A;  . HERNIA REPAIR  06/2011, July 2014   Ventral wall repair with Physiomesh  . TONSILLECTOMY    . TOTAL KNEE ARTHROPLASTY Right 05/10/2016   Procedure: TOTAL KNEE ARTHROPLASTY;  Surgeon: Corky Mull, MD;  Location: [facility] ORS;  Service: Orthopedics;  Laterality: Right;  . TUBAL LIGATION      Social History   Tobacco Use  . Smoking status: Current Every Day Smoker    Packs/day: 0.50    Years: 41.00    Pack years: 20.50    Types: Cigarettes  . Smokeless tobacco: Never Used  Substance Use Topics  . Alcohol use: No    Alcohol/week: 0.0 standard drinks    Comment: sober since October 2018     Current Outpatient Medications:  .  albuterol  (PROVENTIL HFA;VENTOLIN HFA) 108 (90 Base) MCG/ACT inhaler, Inhale 2-4 puffs by mouth every 4 hours as needed for wheezing, cough, and/or shortness of breath, Disp: 1 Inhaler, Rfl: 1 .  amLODipine (NORVASC) 5 MG tablet, Take 1 tablet (5 mg total) by mouth daily., Disp: 90 tablet, Rfl: 1 .  atenolol (TENORMIN) 25 MG tablet, Take 1 tablet (25 mg total) by mouth daily., Disp: 90 tablet, Rfl: 1 .  atorvastatin (LIPITOR) 10 MG tablet, Take 1 tablet (10 mg total) by mouth at bedtime., Disp: 90 tablet, Rfl: 1 .  clotrimazole (LOTRIMIN) 1 % cream, Apply 1 application topically 2 (two) times daily. (Patient taking differently: Apply 1 application topically as needed. ), Disp: 30 g, Rfl: 0 .  cyclobenzaprine (FLEXERIL) 5 MG tablet, Take 5 mg by mouth at bedtime. , Disp: , Rfl:  .  DULoxetine (CYMBALTA) 60 MG capsule, Take 60 mg by mouth 2 (two) times daily. , Disp: , Rfl:  .  econazole nitrate 1 % cream, Apply 1 application topically daily., Disp: , Rfl:  .  gabapentin (NEURONTIN) 300 MG capsule, Take 600 mg by mouth 2 (two) times daily. , Disp: , Rfl:  .  HYDROcodone-acetaminophen (NORCO/VICODIN) 5-325 MG tablet, One-half to one pill by mouth daily PRN ([facility]), Disp: , Rfl:  .  Insulin Pen Needle (ULTICARE MINI PEN NEEDLES) 31G X 6 MM MISC, FOR USE WITH SAXENDA PEN FOR DAILY INJECTIONS, Disp: 90 each, Rfl: 1 .  liraglutide ([name]) 18 MG/3ML SOPN, Inject 0.3 mLs (1.8 mg total) into the skin daily., Disp: 15 pen, Rfl: 0 .  losartan (COZAAR) 100 MG tablet, Take 1 tablet (100 mg total) by mouth daily., Disp: 90 tablet, Rfl: 1 .  naproxen (NAPROSYN) 500 MG tablet, Take 500 mg by mouth 2 (two) times daily with a meal., Disp: , Rfl:  .  NONFORMULARY OR COMPOUNDED ITEM, Apply 1 Dose topically daily for 10 days., Disp: 1 each, Rfl: 0 .  sulfamethoxazole-trimethoprim (BACTRIM DS,SEPTRA DS) 800-160 MG tablet, Take 1 tablet by mouth 2 (two) times daily., Disp: 20 tablet, Rfl: 0 .  traMADol (ULTRAM) 50 MG tablet, Take 2  tablets by mouth at bedtime., Disp: , Rfl:   Allergies  Allergen Reactions  . Hydrochlorothiazide Other (See Comments)    Electrolyte imbalance  . Lasix [Furosemide]     Electrolyte imbalance    ROS   No other specific complaints in a complete review of systems (except as listed in HPI above).  Objective  Vitals:   01/18/18 0756  BP: 110/60  Pulse: 70  Resp: 16  Temp: 98.4 F (36.9 C)  TempSrc: Oral  SpO2: 96%  Weight: 217 lb 8 oz (98.7 kg)  Height: 5\' 2"  (1.575 m)    Body  mass index is 39.78 kg/m.  Nursing Note and Vital Signs reviewed.  Physical Exam  Constitutional: She is oriented to person, place, and time. She appears well-developed and well-nourished.  Cardiovascular: Normal rate, regular rhythm, normal heart sounds and intact distal pulses.  Pulmonary/Chest: Effort normal and breath sounds normal.  Musculoskeletal: Normal range of motion.  Neurological: She is alert and oriented to person, place, and time.  Skin: Left knee redness 4x3cm with open yellow area 2x1.5cm   No results found for this or any previous visit (from the past 48 hour(s)).  Assessment & Plan   1. Abscess of left knee Appears to be improving will extend antibiotic for 14 days instead of 10; recheck in 5 days.  - sulfamethoxazole-trimethoprim (BACTRIM DS,SEPTRA DS) 800-160 MG tablet; Take 1 tablet by mouth 2 (two) times daily.  Dispense: 8 tablet; Refill: 0

## 2018-01-18 NOTE — Patient Instructions (Signed)
- Continue antibiotics for a total of 14 days instead of 10; follow up in 5 days; return sooner if worsening or new or concerning symptoms - Elevated knee when resting - Make sure you are drinking plenty of water and eating good protein to ensure wound healing.  - Keep area clean and dry, keep covered with closure on three sides, can air open at home if no pets.  Wound Care, Adult Taking care of your wound properly can help to prevent pain and infection. It can also help your wound to heal more quickly. How is this treated? Wound care  Follow instructions from your health care provider about how to take care of your wound. Make sure you: ? Wash your hands with soap and water before you change the bandage (dressing). If soap and water are not available, use hand sanitizer. ? Change your dressing as told by your health care provider. ? Leave stitches (sutures), skin glue, or adhesive strips in place. These skin closures may need to stay in place for 2 weeks or longer. If adhesive strip edges start to loosen and curl up, you may trim the loose edges. Do not remove adhesive strips completely unless your health care provider tells you to do that.  Check your wound area every day for signs of infection. Check for: ? More redness, swelling, or pain. ? More fluid or blood. ? Warmth. ? Pus or a bad smell.  Ask your health care provider if you should clean the wound with mild soap and water. Doing this may include: ? Using a clean towel to pat the wound dry after cleaning it. Do not rub or scrub the wound. ? Applying a cream or ointment. Do this only as told by your health care provider. ? Covering the incision with a clean dressing.  Ask your health care provider when you can leave the wound uncovered. Medicines   If you were prescribed an antibiotic medicine, cream, or ointment, take or use the antibiotic as told by your health care provider. Do not stop taking or using the antibiotic even if  your condition improves.  Take over-the-counter and prescription medicines only as told by your health care provider. If you were prescribed pain medicine, take it at least 30 minutes before doing any wound care or as told by your health care provider. General instructions  Return to your normal activities as told by your health care provider. Ask your health care provider what activities are safe.  Do not scratch or pick at the wound.  Keep all follow-up visits as told by your health care provider. This is important.  Eat a diet that includes protein, vitamin A, vitamin C, and other nutrient-rich foods. These help the wound heal: ? Protein-rich foods include meat, dairy, beans, nuts, and other sources. ? Vitamin A-rich foods include carrots and dark green, leafy vegetables. ? Vitamin C-rich foods include citrus, tomatoes, and other fruits and vegetables. ? Nutrient-rich foods have protein, carbohydrates, fat, vitamins, or minerals. Eat a variety of healthy foods including vegetables, fruits, and whole grains. Contact a health care provider if:  You received a tetanus shot and you have swelling, severe pain, redness, or bleeding at the injection site.  Your pain is not controlled with medicine.  You have more redness, swelling, or pain around the wound.  You have more fluid or blood coming from the wound.  Your wound feels warm to the touch.  You have pus or a bad smell coming from the  wound.  You have a fever or chills.  You are nauseous or you vomit.  You are dizzy. Get help right away if:  You have a red streak going away from your wound.  The edges of the wound open up and separate.  Your wound is bleeding and the bleeding does not stop with gentle pressure.  You have a rash.  You faint.  You have trouble breathing. This information is not intended to replace advice given to you by your health care provider. Make sure you discuss any questions you have with your  health care provider. Document Released: 02/02/2008 Document Revised: 12/23/2015 Document Reviewed: 11/10/2015 Elsevier Interactive Patient Education  2017 [employer].

## 2018-01-18 NOTE — Addendum Note (Signed)
Addended by: Fredderick Severance on: 01/18/2018 08:40 AM   Modules accepted: Orders

## 2018-01-19 ENCOUNTER — Telehealth: Payer: Self-pay | Admitting: Nurse Practitioner

## 2018-01-19 DIAGNOSIS — L02416 Cutaneous abscess of left lower limb: Secondary | ICD-10-CM

## 2018-01-19 NOTE — Telephone Encounter (Signed)
Mali with [facility] states pt came in advising RX was extended for ABX but he has not received a prescription. Please call to advise.

## 2018-01-19 NOTE — Telephone Encounter (Signed)
Pharmacy called. Pharmacy aware.

## 2018-01-19 NOTE — Telephone Encounter (Signed)
Can you please call pharmacy and ask why they are asking for a refill on antibiotics. I sent a 4 day supply request to extend her from 10 days to 14 days total of Bactrim. Thanks.

## 2018-01-23 ENCOUNTER — Encounter: Payer: Self-pay | Admitting: Nurse Practitioner

## 2018-01-23 ENCOUNTER — Ambulatory Visit (INDEPENDENT_AMBULATORY_CARE_PROVIDER_SITE_OTHER): Payer: Medicare HMO | Admitting: Nurse Practitioner

## 2018-01-23 VITALS — BP 120/60 | HR 79 | Temp 97.9°F | Resp 16 | Ht 62.0 in | Wt 219.5 lb

## 2018-01-23 DIAGNOSIS — Z5189 Encounter for other specified aftercare: Secondary | ICD-10-CM

## 2018-01-23 DIAGNOSIS — G8929 Other chronic pain: Secondary | ICD-10-CM

## 2018-01-23 DIAGNOSIS — M5442 Lumbago with sciatica, left side: Secondary | ICD-10-CM

## 2018-01-23 DIAGNOSIS — M5441 Lumbago with sciatica, right side: Secondary | ICD-10-CM | POA: Diagnosis not present

## 2018-01-23 NOTE — Progress Notes (Signed)
Name: Susan [name]   MRN: [id number]    DOB: November 03, 1960   Date:01/23/2018       Progress Note  Subjective  Chief Complaint  Chief Complaint  Patient presents with  . Abscess    left knee    HPI  Patient presents for left knee abscess follow-up. Completed 13 days of oral bactrim DS BID will complete course tomorrow. It is healing well.   Patient endorses chronic back pain and left hip pain has worsened. States has appointment for epidural shot on the 26th   Patient Active Problem List   Diagnosis Date Noted  . Encounter for screening for HIV 05/16/2017  . Dyslipidemia 03/28/2017  . Cocaine use disorder ([place]) 02/27/2017  . Low HDL (under 40) 10/14/2016  . Degenerative disc disease at L5-S1 level 09/28/2016  . 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 Tobacco abuse 04/21/2016  . Cracked skin on feet 01/19/2016  . Diastasis recti 12/18/2015  . GERD (gastroesophageal reflux disease) 12/17/2015  . Chronic back pain 10/09/2015  . Post-traumatic osteoarthritis of right knee 09/24/2015  . Morbid obesity ([place]) 08/06/2015  . Pain in right knee 08/03/2015  . Postmenopausal 07/08/2015  . Cervical cancer screening 07/08/2015  . Alcoholism in recovery ([place]) 04/13/2015  . Impaired fasting glucose 04/10/2015  . Need for Streptococcus pneumoniae and influenza vaccination 04/09/2015  . Vitamin D deficiency 04/09/2015  . Vitamin B12 deficiency 04/09/2015  . Medication monitoring encounter 04/09/2015  . OSA on CPAP 04/09/2015  . Breast cancer screening 04/09/2015  . Subclinical hyperthyroidism 06/26/2014  . Toxic multinodular goiter 06/26/2014  . Hypertension 06/26/2014  . Hepatitis C 06/26/2014  . Bipolar disorder ([place]) 06/26/2014  . Throat dryness 05/20/2014  . Lumbar radiculitis 10/03/2013  . Diverticulosis 08/24/2013  . Recurrent ventral hernia 11/08/2012  . Incisional hernia 11/08/2012    Past Medical History:  Diagnosis Date  . Anxiety   .  Arthritis    rheumatoid arthritis  . Asthma   . Bipolar disorder ([place])   . Degenerative disc disease at L5-S1 level 09/28/2016   See ortho note May 2018  . Depression    bipolar, hx of suicide attempt  . Dyspnea    with exertion  . GERD (gastroesophageal reflux disease)   . H/O suicide attempt    slit wrists  . History of alcohol abuse   . History of diverticulitis 2013  . History of hepatitis C    HEP "C"--three years ago  . History of MRSA infection 2013  . Hypertension   . Hypothyroidism   . 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 Multinodular thyroid   . OSA (obstructive sleep apnea)    managed by Dr. Manuella Ghazi  . Osteoporosis   . Post-traumatic osteoarthritis of right knee 09/24/2015  . Sleep apnea     Dx 3 years ago. Use C-PAP  . Thyroid disease    Goiter  . Vitamin B12 deficiency   . Vitamin D deficiency disease     Past Surgical History:  Procedure Laterality Date  . BILATERAL SALPINGOOPHORECTOMY     due to abnormal mass  . BREAST BIOPSY Left    neg  . BREAST SURGERY Left 20 yrs ago  . CESAREAN SECTION    . COLONOSCOPY    . COLONOSCOPY WITH PROPOFOL N/A 10/16/2017   Procedure: COLONOSCOPY WITH PROPOFOL;  Surgeon: Jonathon Bellows, MD;  Location: [facility] ENDOSCOPY;  Service: Gastroenterology;  Laterality: N/A;  . HERNIA REPAIR  06/2011, July 2014  Ventral wall repair with Physiomesh  . TONSILLECTOMY    . TOTAL KNEE ARTHROPLASTY Right 05/10/2016   Procedure: TOTAL KNEE ARTHROPLASTY;  Surgeon: Corky Mull, MD;  Location: [facility] ORS;  Service: Orthopedics;  Laterality: Right;  . TUBAL LIGATION      Social History   Tobacco Use  . Smoking status: Current Every Day Smoker    Packs/day: 0.50    Years: 41.00    Pack years: 20.50    Types: Cigarettes  . Smokeless tobacco: Never Used  Substance Use Topics  . Alcohol use: No    Alcohol/week: 0.0 standard drinks    Comment: sober since October 2018     Current Outpatient Medications:  .  albuterol (PROVENTIL HFA;VENTOLIN HFA) 108 (90  Base) MCG/ACT inhaler, Inhale 2-4 puffs by mouth every 4 hours as needed for wheezing, cough, and/or shortness of breath, Disp: 1 Inhaler, Rfl: 1 .  amLODipine (NORVASC) 5 MG tablet, Take 1 tablet (5 mg total) by mouth daily., Disp: 90 tablet, Rfl: 1 .  atenolol (TENORMIN) 25 MG tablet, Take 1 tablet (25 mg total) by mouth daily., Disp: 90 tablet, Rfl: 1 .  atorvastatin (LIPITOR) 10 MG tablet, Take 1 tablet (10 mg total) by mouth at bedtime., Disp: 90 tablet, Rfl: 1 .  clotrimazole (LOTRIMIN) 1 % cream, Apply 1 application topically 2 (two) times daily. (Patient taking differently: Apply 1 application topically as needed. ), Disp: 30 g, Rfl: 0 .  cyclobenzaprine (FLEXERIL) 5 MG tablet, Take 5 mg by mouth at bedtime. , Disp: , Rfl:  .  DULoxetine (CYMBALTA) 60 MG capsule, Take 60 mg by mouth 2 (two) times daily. , Disp: , Rfl:  .  econazole nitrate 1 % cream, Apply 1 application topically daily., Disp: , Rfl:  .  gabapentin (NEURONTIN) 300 MG capsule, Take 600 mg by mouth 2 (two) times daily. , Disp: , Rfl:  .  HYDROcodone-acetaminophen (NORCO/VICODIN) 5-325 MG tablet, One-half to one pill by mouth daily PRN ([facility]), Disp: , Rfl:  .  Insulin Pen Needle (ULTICARE MINI PEN NEEDLES) 31G X 6 MM MISC, FOR USE WITH SAXENDA PEN FOR DAILY INJECTIONS, Disp: 90 each, Rfl: 1 .  liraglutide ([name]) 18 MG/3ML SOPN, Inject 0.3 mLs (1.8 mg total) into the skin daily., Disp: 15 pen, Rfl: 0 .  losartan (COZAAR) 100 MG tablet, Take 1 tablet (100 mg total) by mouth daily., Disp: 90 tablet, Rfl: 1 .  naproxen (NAPROSYN) 500 MG tablet, Take 500 mg by mouth 2 (two) times daily with a meal., Disp: , Rfl:  .  sulfamethoxazole-trimethoprim (BACTRIM DS,SEPTRA DS) 800-160 MG tablet, Take 1 tablet by mouth 2 (two) times daily., Disp: 20 tablet, Rfl: 0 .  traMADol (ULTRAM) 50 MG tablet, Take 2 tablets by mouth at bedtime., Disp: , Rfl:   Allergies  Allergen Reactions  . Hydrochlorothiazide Other (See Comments)    Electrolyte  imbalance  . Lasix [Furosemide]     Electrolyte imbalance    ROS   No other specific complaints in a complete review of systems (except as listed in HPI above).  Objective  Vitals:   01/23/18 0801  BP: 120/60  Pulse: 79  Resp: 16  Temp: 97.9 F (36.6 C)  TempSrc: Oral  SpO2: 96%  Weight: 219 lb 8 oz (99.6 kg)  Height: 5\' 2"  (1.575 m)     Body mass index is 40.15 kg/m.  Nursing Note and Vital Signs reviewed.  Physical Exam  Constitutional: She appears well-developed.  Cardiovascular: Normal  rate.  Pulmonary/Chest: Effort normal.  Musculoskeletal: Normal range of motion.  Skin: Skin is warm and dry.     Psychiatric: She has a normal mood and affect. Her behavior is normal. Judgment and thought content normal.      No results found for this or any previous visit (from the past 48 hour(s)).  Assessment & Plan  1. Wound check, abscess Healing well; keep clean and dry and return if not improving or any worsening.   2. Chronic midline low back pain with bilateral sciatica Sees physiatrist and is already on medication regimen- consider lidocaine patch for mid back pain, heat and ice, discussed red flag signs.

## 2018-01-24 ENCOUNTER — Other Ambulatory Visit: Payer: Self-pay | Admitting: Family Medicine

## 2018-01-24 DIAGNOSIS — Z6841 Body Mass Index (BMI) 40.0 and over, adult: Secondary | ICD-10-CM | POA: Diagnosis not present

## 2018-01-24 DIAGNOSIS — H2513 Age-related nuclear cataract, bilateral: Secondary | ICD-10-CM | POA: Diagnosis not present

## 2018-01-24 DIAGNOSIS — M654 Radial styloid tenosynovitis [de Quervain]: Secondary | ICD-10-CM | POA: Diagnosis not present

## 2018-01-24 DIAGNOSIS — J449 Chronic obstructive pulmonary disease, unspecified: Secondary | ICD-10-CM | POA: Diagnosis not present

## 2018-01-24 DIAGNOSIS — M65331 Trigger finger, right middle finger: Secondary | ICD-10-CM | POA: Diagnosis not present

## 2018-02-01 DIAGNOSIS — M5136 Other intervertebral disc degeneration, lumbar region: Secondary | ICD-10-CM | POA: Diagnosis not present

## 2018-02-01 DIAGNOSIS — M5416 Radiculopathy, lumbar region: Secondary | ICD-10-CM | POA: Diagnosis not present

## 2018-02-01 DIAGNOSIS — M48062 Spinal stenosis, lumbar region with neurogenic claudication: Secondary | ICD-10-CM | POA: Diagnosis not present

## 2018-02-07 DIAGNOSIS — S92424D Nondisplaced fracture of distal phalanx of right great toe, subsequent encounter for fracture with routine healing: Secondary | ICD-10-CM | POA: Diagnosis not present

## 2018-02-07 DIAGNOSIS — M79672 Pain in left foot: Secondary | ICD-10-CM | POA: Diagnosis not present

## 2018-02-20 ENCOUNTER — Telehealth: Payer: Self-pay | Admitting: Family Medicine

## 2018-02-20 ENCOUNTER — Encounter: Payer: Self-pay | Admitting: Family Medicine

## 2018-02-20 DIAGNOSIS — J432 Centrilobular emphysema: Secondary | ICD-10-CM

## 2018-02-20 DIAGNOSIS — I700 Atherosclerosis of aorta: Secondary | ICD-10-CM | POA: Insufficient documentation

## 2018-02-20 HISTORY — DX: Centrilobular emphysema: J43.2

## 2018-02-20 HISTORY — DX: Atherosclerosis of aorta: I70.0

## 2018-02-20 NOTE — Telephone Encounter (Signed)
Appt made for 10.29.19

## 2018-02-20 NOTE — Telephone Encounter (Signed)
Patient's last visit with me was just over 6 months ago I'd like to see her back in the next few weeks We'll get labs then and go over her chest CT results Thank you

## 2018-02-23 DIAGNOSIS — M48062 Spinal stenosis, lumbar region with neurogenic claudication: Secondary | ICD-10-CM | POA: Diagnosis not present

## 2018-02-23 DIAGNOSIS — M62830 Muscle spasm of back: Secondary | ICD-10-CM | POA: Diagnosis not present

## 2018-02-23 DIAGNOSIS — M5416 Radiculopathy, lumbar region: Secondary | ICD-10-CM | POA: Diagnosis not present

## 2018-02-23 DIAGNOSIS — M5136 Other intervertebral disc degeneration, lumbar region: Secondary | ICD-10-CM | POA: Diagnosis not present

## 2018-03-06 ENCOUNTER — Ambulatory Visit (INDEPENDENT_AMBULATORY_CARE_PROVIDER_SITE_OTHER): Payer: Medicare HMO | Admitting: Nurse Practitioner

## 2018-03-06 ENCOUNTER — Ambulatory Visit: Payer: Medicare HMO | Admitting: Family Medicine

## 2018-03-06 ENCOUNTER — Telehealth: Payer: Self-pay | Admitting: Family Medicine

## 2018-03-06 ENCOUNTER — Encounter: Payer: Self-pay | Admitting: Nurse Practitioner

## 2018-03-06 ENCOUNTER — Ambulatory Visit
Admission: RE | Admit: 2018-03-06 | Discharge: 2018-03-06 | Disposition: A | Discharge status: Home / Self Care | Payer: Medicare HMO | Source: Ambulatory Visit | Attending: Nurse Practitioner | Admitting: Nurse Practitioner

## 2018-03-06 VITALS — BP 126/82 | HR 75 | Temp 97.8°F | Resp 16 | Ht 62.0 in | Wt 214.4 lb

## 2018-03-06 DIAGNOSIS — I700 Atherosclerosis of aorta: Secondary | ICD-10-CM | POA: Diagnosis not present

## 2018-03-06 DIAGNOSIS — R1030 Lower abdominal pain, unspecified: Secondary | ICD-10-CM

## 2018-03-06 DIAGNOSIS — Z716 Tobacco abuse counseling: Secondary | ICD-10-CM | POA: Diagnosis not present

## 2018-03-06 DIAGNOSIS — R3129 Other microscopic hematuria: Secondary | ICD-10-CM | POA: Diagnosis not present

## 2018-03-06 DIAGNOSIS — G8929 Other chronic pain: Secondary | ICD-10-CM

## 2018-03-06 DIAGNOSIS — K5730 Diverticulosis of large intestine without perforation or abscess without bleeding: Secondary | ICD-10-CM | POA: Insufficient documentation

## 2018-03-06 DIAGNOSIS — M5442 Lumbago with sciatica, left side: Secondary | ICD-10-CM | POA: Diagnosis not present

## 2018-03-06 DIAGNOSIS — R319 Hematuria, unspecified: Secondary | ICD-10-CM

## 2018-03-06 DIAGNOSIS — I10 Essential (primary) hypertension: Secondary | ICD-10-CM

## 2018-03-06 DIAGNOSIS — Z720 Tobacco use: Secondary | ICD-10-CM | POA: Diagnosis not present

## 2018-03-06 DIAGNOSIS — K429 Umbilical hernia without obstruction or gangrene: Secondary | ICD-10-CM | POA: Insufficient documentation

## 2018-03-06 LAB — POCT URINALYSIS DIPSTICK
Appearance: NORMAL
Bilirubin, UA: NEGATIVE
Glucose, UA: NEGATIVE
Ketones, UA: NEGATIVE
Leukocytes, UA: NEGATIVE
Nitrite, UA: NEGATIVE
Protein, UA: NEGATIVE
Spec Grav, UA: 1.01 (ref 1.010–1.025)
Urobilinogen, UA: 0.2 E.U./dL
pH, UA: 6 (ref 5.0–8.0)

## 2018-03-06 MED ORDER — PREDNISONE 10 MG (48) PO TBPK: ORAL_TABLET | ORAL | 0 refills | Status: DC

## 2018-03-06 NOTE — Progress Notes (Signed)
Name: Susan [name]   MRN: [id number]    DOB: April 09, 1961   Date:03/06/2018       Progress Note  Subjective  Chief Complaint  Chief Complaint  Patient presents with  . Leg Pain    left  . Results  . Referral    HPI  Left leg pain- sees Dr. Sharlet Salina at [facility] for epidurals for pain relief; states since the last one has caused severe episodes of left leg pain and states has to limp because of the pain, states would like to see orthopedic. States has dealt with sciatic pain for a long time, requesting short term steroid medication to relieve pain. States pain is not managed almost went to the ER due to severe pain. States concerned for falls due to pain; no falls yet. No bowel or bladder incontinence  Back pain shoots down to left groin x 3 weeks; no dysuria.   Taking losartan 100mg  and amlodipine 5mg  every morning and atenolol 25mg  at night.     Patient Active Problem List   Diagnosis Date Noted  . Aortic atherosclerosis ([place]) 02/20/2018  . Centrilobular emphysema ([place]) 02/20/2018  . Encounter for screening for HIV 05/16/2017  . Dyslipidemia 03/28/2017  . Cocaine use disorder ([place]) 02/27/2017  . Low HDL (under 40) 10/14/2016  . Degenerative disc disease at L5-S1 level 09/28/2016  . 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 Tobacco abuse 04/21/2016  . Cracked skin on feet 01/19/2016  . Diastasis recti 12/18/2015  . GERD (gastroesophageal reflux disease) 12/17/2015  . Chronic back pain 10/09/2015  . Post-traumatic osteoarthritis of right knee 09/24/2015  . Morbid obesity ([place]) 08/06/2015  . Pain in right knee 08/03/2015  . Postmenopausal 07/08/2015  . Cervical cancer screening 07/08/2015  . Alcoholism in recovery ([place]) 04/13/2015  . Impaired fasting glucose 04/10/2015  . Need for Streptococcus pneumoniae and influenza vaccination 04/09/2015  . Vitamin D deficiency 04/09/2015  . Vitamin B12 deficiency 04/09/2015  . Medication monitoring  encounter 04/09/2015  . OSA on CPAP 04/09/2015  . Breast cancer screening 04/09/2015  . Subclinical hyperthyroidism 06/26/2014  . Toxic multinodular goiter 06/26/2014  . Hypertension 06/26/2014  . Hepatitis C 06/26/2014  . Bipolar disorder ([place]) 06/26/2014  . Throat dryness 05/20/2014  . Lumbar radiculitis 10/03/2013  . Diverticulosis 08/24/2013  . Recurrent ventral hernia 11/08/2012  . Incisional hernia 11/08/2012    Past Medical History:  Diagnosis Date  . Anxiety   . Aortic atherosclerosis ([place]) 02/20/2018   Chest CT Sept 2019  . Arthritis    rheumatoid arthritis  . Asthma   . Bipolar disorder ([place])   . Centrilobular emphysema ([place]) 02/20/2018   Chest CT Sept 2019  . Degenerative disc disease at L5-S1 level 09/28/2016   See ortho note May 2018  . Depression    bipolar, hx of suicide attempt  . Dyspnea    with exertion  . GERD (gastroesophageal reflux disease)   . H/O suicide attempt    slit wrists  . History of alcohol abuse   . History of diverticulitis 2013  . History of hepatitis C    HEP "C"--three years ago  . History of MRSA infection 2013  . Hypertension   . Hypothyroidism   . 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 Multinodular thyroid   . OSA (obstructive sleep apnea)    managed by Dr. Manuella Ghazi  . Osteoporosis   . Post-traumatic osteoarthritis of right knee 09/24/2015  . Sleep apnea  Dx 3 years ago. Use C-PAP  . Thyroid disease    Goiter  . Vitamin B12 deficiency   . Vitamin D deficiency disease     Past Surgical History:  Procedure Laterality Date  . BILATERAL SALPINGOOPHORECTOMY     due to abnormal mass  . BREAST BIOPSY Left    neg  . BREAST SURGERY Left 20 yrs ago  . CESAREAN SECTION    . COLONOSCOPY    . COLONOSCOPY WITH PROPOFOL N/A 10/16/2017   Procedure: COLONOSCOPY WITH PROPOFOL;  Surgeon: Jonathon Bellows, MD;  Location[facility] ENDOSCOPY;  Service: Gastroenterology;  Laterality: N/A;  . HERNIA REPAIR  06/2011, July 2014   Ventral wall repair with  Physiomesh  . TONSILLECTOMY    . TOTAL KNEE ARTHROPLASTY Right 05/10/2016   Procedure: TOTAL KNEE ARTHROPLASTY;  Surgeon: Corky Mull, MD;  Location: [facility] ORS;  Service: Orthopedics;  Laterality: Right;  . TUBAL LIGATION      Social History   Tobacco Use  . Smoking status: Current Every Day Smoker    Packs/day: 0.50    Years: 41.00    Pack years: 20.50    Types: Cigarettes  . Smokeless tobacco: Never Used  Substance Use Topics  . Alcohol use: No    Alcohol/week: 0.0 standard drinks    Comment: sober since October 2018     Current Outpatient Medications:  .  albuterol (PROVENTIL HFA;VENTOLIN HFA) 108 (90 Base) MCG/ACT inhaler, Inhale 2-4 puffs by mouth every 4 hours as needed for wheezing, cough, and/or shortness of breath, Disp: 1 Inhaler, Rfl: 1 .  amLODipine (NORVASC) 5 MG tablet, Take 1 tablet (5 mg total) by mouth daily., Disp: 90 tablet, Rfl: 1 .  atenolol (TENORMIN) 25 MG tablet, Take 1 tablet (25 mg total) by mouth daily., Disp: 90 tablet, Rfl: 1 .  atorvastatin (LIPITOR) 10 MG tablet, Take 1 tablet (10 mg total) by mouth at bedtime., Disp: 90 tablet, Rfl: 1 .  clotrimazole (LOTRIMIN) 1 % cream, Apply 1 application topically 2 (two) times daily. (Patient taking differently: Apply 1 application topically as needed. ), Disp: 30 g, Rfl: 0 .  cyclobenzaprine (FLEXERIL) 5 MG tablet, Take 5 mg by mouth at bedtime. , Disp: , Rfl:  .  DULoxetine (CYMBALTA) 60 MG capsule, Take 60 mg by mouth 2 (two) times daily. , Disp: , Rfl:  .  econazole nitrate 1 % cream, Apply 1 application topically daily., Disp: , Rfl:  .  gabapentin (NEURONTIN) 300 MG capsule, Take 600 mg by mouth 2 (two) times daily. , Disp: , Rfl:  .  HYDROcodone-acetaminophen (NORCO/VICODIN) 5-325 MG tablet, One-half to one pill by mouth daily PRN ([facility]), Disp: , Rfl:  .  Insulin Pen Needle (ULTICARE MINI PEN NEEDLES) 31G X 6 MM MISC, FOR USE WITH SAXENDA PEN FOR DAILY INJECTIONS, Disp: 90 each, Rfl: 1 .  losartan (COZAAR)  100 MG tablet, Take 1 tablet (100 mg total) by mouth daily., Disp: 90 tablet, Rfl: 1 .  [name] 18 MG/3ML SOPN, INJECT 0.3 MLS (1.8MG  TOTAL) INTO THE SKIN DAILY., Disp: 27 mL, Rfl: 0 .  naproxen (NAPROSYN) 500 MG tablet, Take 500 mg by mouth 2 (two) times daily with a meal., Disp: , Rfl:  .  predniSONE (STERAPRED UNI-PAK 48 TAB) 10 MG (48) TBPK tablet, Take as directed, Disp: 48 tablet, Rfl: 0  Allergies  Allergen Reactions  . Hydrochlorothiazide Other (See Comments)    Electrolyte imbalance  . Lasix [Furosemide]     Electrolyte imbalance  ROS   No other specific complaints in a complete review of systems (except as listed in HPI above).  Objective  Vitals:   03/06/18 0909 03/06/18 0910  BP: 140/80 126/82  Pulse: 75   Resp: 16   Temp: 97.8 F (36.6 C)   TempSrc: Oral   SpO2: 96%   Weight: 214 lb 6.4 oz (97.3 kg)   Height: 5\' 2"  (1.575 m)      Body mass index is 39.21 kg/m.  Nursing Note and Vital Signs reviewed.  Physical Exam  Constitutional: She is oriented to person, place, and time. She appears well-developed and well-nourished. She is cooperative.  HENT:  Head: Normocephalic and atraumatic.  Right Ear: Hearing normal.  Left Ear: Hearing normal.  Mouth/Throat: Mucous membranes are normal.  Eyes: Conjunctivae are normal.  Cardiovascular: Normal rate, regular rhythm and normal heart sounds.  Pulmonary/Chest: Effort normal and breath sounds normal.  Abdominal: Normal appearance. There is no tenderness.  No CVA tenderness   Musculoskeletal: Normal range of motion.  Neurological: She is alert and oriented to person, place, and time.  Psychiatric: She has a normal mood and affect. Her speech is normal and behavior is normal. Judgment and thought content normal.      Results for orders placed or performed in visit on 03/06/18 (from the past 48 hour(s))  POCT Urinalysis Dipstick     Status: Abnormal   Collection Time: 03/06/18 10:16 AM  Result Value Ref  Range   Color, UA Yellow    Clarity, UA Clear    Glucose, UA Negative Negative   Bilirubin, UA Negative    Ketones, UA Negative    Spec Grav, UA 1.010 1.010 - 1.025   Blood, UA Trace    pH, UA 6.0 5.0 - 8.0   Protein, UA Negative Negative   Urobilinogen, UA 0.2 0.2 or 1.0 E.U./dL   Nitrite, UA Negative    Leukocytes, UA Negative Negative   Appearance Normal    Odor Normale     Assessment & Plan 1. Chronic midline low back pain with left-sided sciatica Patient endorses acute on chronic lower back pain now radiating down left leg and around to groin. Requesting referral to ortho and pain management  - Ambulatory referral to Orthopedic Surgery - Ambulatory referral to Pain Clinic - predniSONE (STERAPRED UNI-PAK 48 TAB) 10 MG (48) TBPK tablet; Take as directed  Dispense: 48 tablet; Refill: 0 - POCT Urinalysis Dipstick  2. Aortic atherosclerosis ([place]) Discussed importance of quitting smoking, blood pressure control, diet.  - Lipid Profile  3. Encounter for smoking cessation counseling Greater than 3 minutes spent discussing plan   4. Tobacco use Still smoking but has strong desire to quit; plans to quit when non-smoking brother comes to live with her; using nicotine patch   5. Essential hypertension Taking medications daily, states normal readings at home.   6. Hematuria, unspecified type - Basic Metabolic Panel (BMET) - Urinalysis, Routine w reflex microscopic  7. Lower abdominal pain Consider kidney stone as alternate cause for acute worsening of pain due to hematuria  - CT RENAL STONE STUDY; Future

## 2018-03-06 NOTE — Telephone Encounter (Signed)
Please disregard, pt has been scheduled for today at 230

## 2018-03-06 NOTE — Telephone Encounter (Signed)
Copied from [place] [phone]. Topic: Quick Communication - See Telephone Encounter >> Mar 06, 2018 11:40 AM Nils Flack wrote: CRM for notification. See Telephone encounter for: 03/06/18. I have called pt 4 times to get her set up for EMERGENCY ct.  I have left 2 messages.   Pt does not answer or return call.  Cma verified the correct number on chart at visit today.

## 2018-03-06 NOTE — Patient Instructions (Addendum)
Good cholesterol, also called high-density lipoprotein (HDL) removes extra cholesterol and plaque buildup in your arteries and then sends it to your liver to get rid of and helps reduce your risk of heart disease, heart attack, and stroke.Foods that increase HDL: beans and legumes, whole grains, high-fiber fruits:prunes, apples, and pears; fatty fish- salmon, tuna, sardines; nuts, olive oil  Your goal blood pressure is less than 140 mmHg on top. Try to follow the DASH guidelines (DASH stands for Dietary Approaches to Stop Hypertension) Try to limit the sodium in your diet. Ideally, consume less than 1.5 grams (less than 1,500mg ) per day. Do not add salt when cooking or at the table.  Check the sodium amount on labels when shopping, and choose items lower in sodium when given a choice. Avoid or limit foods that already contain a lot of sodium. Eat a diet rich in fruits and vegetables and whole grains.   Atherosclerosis Atherosclerosis is narrowing and hardening of the blood vessels (arteries). Arteries are tubes that carry blood that contains oxygen from the heart to all parts of the body. Arteries can become narrow or clogged with a buildup of fat, cholesterol, calcium, or other substances (plaque). Plaque decreases the amount of blood that can flow through the artery. Atherosclerosis can affect any artery in the body, including:  Heart arteries (coronary artery disease), which may cause heart attack.  Brain arteries, which may cause stroke.  Leg, arm, and pelvis arteries (peripheral artery disease), which may cause pain and numbness.  Kidney arteries, which may cause kidney (renal) failure.  Treatment may slow the disease and prevent further damage to the heart, brain, peripheral arteries, and kidneys. What are the causes? Atherosclerosis develops when plaque forms in an artery. This damages the inside wall of the artery. Over time, the plaque grows and hardens. It may break through the artery  wall. This causes a blood clot to form over the break, which narrows the artery more. The clot may also break loose and travel to other arteries, causing more damage. What increases the risk? This condition is more likely to develop in people who:  Are middle age or older.  Have a family history of atherosclerosis.  Have high cholesterol.  Have high blood fats (triglycerides).  Have diabetes.  Are overweight.  Smoke tobacco.  Do not exercise enough.  Have a substance in the blood that indicates increased levels of inflammation in the body (C-reactive protein, or CRP).  Have sleep apnea.  Are stressed.  Drink too much alcohol.  What are the signs or symptoms? This condition may not cause any symptoms. If you do have symptoms, they are caused by damage to an area of your body that is not getting enough blood. The following symptoms are possible:  Coronary artery disease may cause chest pain and shortness of breath.  Decreased blood supply to your brain may cause a stroke. Signs and symptoms of stroke may include sudden: ? Weakness on one side of the body. ? Confusion. ? Changes in vision. ? Inability to speak or understand speech. ? Loss of balance, coordination, or ability to walk. ? Severe headache. ? Loss of consciousness.  Peripheral artery disease may cause pain and numbness, often in the legs and hips.  Renal failure may cause fatigue, nausea, swelling, and itchy skin.  How is this diagnosed? This condition is diagnosed based on your medical history and a physical exam. During the exam, your health care provider will check your pulses and listen for a "  whooshing" sound over your arteries (bruit). You may have tests, such as:  Blood tests to check your levels of cholesterol, triglycerides, and CRP.  Electrocardiogram (ECG) to check for heart damage.  Chest X-ray to see if your heart is enlarged, which is a sign of heart failure.  Stress test to see how your  heart reacts to exercise.  Echocardiogram to get images of the inside of your heart.  Ankle-Brachial index to compare blood pressure in your arms to blood pressure in your ankles.  Ultrasound of your peripheral arteries to check blood flow.  CT scan to check for damage to your heart or brain.  X-rays of blood vessels after dye has been injected (angiogram) to check blood flow.  How is this treated? Treatment starts with lifestyle changes, which may include:  Changing your diet.  Losing weight.  Reducing stress.  Exercising and being more physically active.  Not smoking.  You also may need medicine to:  Lower triglycerides and cholesterol.  Lower and control blood pressure.  Prevent blood clots.  Lower inflammation in your body.  Lower and control your blood sugar.  Sometimes, surgery is needed to remove plaque, widen your artery, or create a new path for your blood (bypass). Surgical treatment may include:  Removing plaque from an artery (endarterectomy).  Opening a narrowed heart artery (angioplasty).  Heart or peripheral artery bypass graft surgery.  Follow these instructions at home: Lifestyle   Eat a heart-healthy diet. Talk to your health care provider or a diet specialist (dietitian) if you need help. A heart-healthy diet includes: ? Limiting unhealthy fats and increasing healthy fats. Some examples of healthy fats are olive oil and canola oil. ? Eating plant-based foods, such as fruits, vegetables, nuts, legumes, and whole grains.  Follow an exercise program as told by your health care provider.  Maintain a healthy weight. Lose weight if directed by your health care provider.  Rest when you are tired.  Learn to manage your stress.  Do not use any tobacco products, such as cigarettes, chewing tobacco, and e-cigarettes. If you need help quitting, ask your health care provider.  Limit alcohol intake to no more than 1 drink a day for nonpregnant women  and 2 drinks a day for men. One drink equals 12 oz of beer, 5 oz of wine, or 1 oz of hard liquor.  Do not abuse drugs. General instructions  Take over-the-counter and prescription medicines only as told by your health care provider.  Manage other health conditions as told by your health care provider.  Keep all follow-up visits as told by your health care provider. This is important. Contact a health care provider if:  You have chest pain or discomfort. This includes squeezing chest pain that may feel like indigestion (angina).  You have shortness of breath.  You have an irregular heartbeat.  You have unexplained fatigue.  You have unexplained pain or numbness in an arm, leg, or hip.  You have nausea, swelling of your hands or feet, and itchy skin. Get help right away if:  You have symptoms of a heart attack, such as: ? Chest pain. ? Shortness of breath. ? Pain in your neck, jaw, arms, back, or stomach. ? Cold sweat. ? Nausea. ? Light-headedness.  You have symptoms of a stroke, such as sudden: ? Weakness on one side of your body. ? Confusion. ? Changes in vision. ? Inability to speak or understand speech. ? Loss of balance, coordination, or ability to walk. ?  Severe headache. ? Loss of consciousness. These symptoms may represent a serious problem that is an emergency. Do not wait to see if the symptoms will go away. Get medical help right away. Call your local emergency services (911 in the [place] Do not drive yourself to the hospital. This information is not intended to replace advice given to you by your health care provider. Make sure you discuss any questions you have with your health care provider. Document Released: 07/16/2003 Document Revised: 10/01/2015 Document Reviewed: 03/16/2015 Elsevier Interactive Patient Education  [employer].

## 2018-03-07 DIAGNOSIS — M5416 Radiculopathy, lumbar region: Secondary | ICD-10-CM | POA: Diagnosis not present

## 2018-03-07 DIAGNOSIS — M48062 Spinal stenosis, lumbar region with neurogenic claudication: Secondary | ICD-10-CM | POA: Diagnosis not present

## 2018-03-07 DIAGNOSIS — M5126 Other intervertebral disc displacement, lumbar region: Secondary | ICD-10-CM | POA: Diagnosis not present

## 2018-03-07 DIAGNOSIS — M5136 Other intervertebral disc degeneration, lumbar region: Secondary | ICD-10-CM | POA: Diagnosis not present

## 2018-03-07 DIAGNOSIS — M62830 Muscle spasm of back: Secondary | ICD-10-CM | POA: Diagnosis not present

## 2018-03-07 LAB — LIPID PANEL
Chol/HDL Ratio: 3.5 ratio (ref 0.0–4.4)
Cholesterol, Total: 133 mg/dL (ref 100–199)
HDL: 38 mg/dL — ABNORMAL LOW (ref >39)
LDL Calculated: 61 mg/dL (ref 0–99)
Triglycerides: 169 mg/dL — ABNORMAL HIGH (ref 0–149)
VLDL Cholesterol Cal: 34 mg/dL (ref 5–40)

## 2018-03-07 LAB — BASIC METABOLIC PANEL
BUN/Creatinine Ratio: 12 (ref 9–23)
BUN: 11 mg/dL (ref 6–24)
CO2: 23 mmol/L (ref 20–29)
Calcium: 9.7 mg/dL (ref 8.7–10.2)
Chloride: 101 mmol/L (ref 96–106)
Creatinine, Ser: 0.92 mg/dL (ref 0.57–1.00)
GFR calc Af Amer: 80 mL/min/{1.73_m2} (ref >59)
GFR calc non Af Amer: 69 mL/min/{1.73_m2} (ref >59)
Glucose: 128 mg/dL — ABNORMAL HIGH (ref 65–99)
Potassium: 4.1 mmol/L (ref 3.5–5.2)
Sodium: 141 mmol/L (ref 134–144)

## 2018-03-07 LAB — URINALYSIS, ROUTINE W REFLEX MICROSCOPIC
Bilirubin, UA: NEGATIVE
Glucose, UA: NEGATIVE
Ketones, UA: NEGATIVE
Leukocytes, UA: NEGATIVE
Nitrite, UA: NEGATIVE
Protein, UA: NEGATIVE
RBC, UA: NEGATIVE
Specific Gravity, UA: 1.005 (ref 1.005–1.030)
Urobilinogen, Ur: 0.2 mg/dL (ref 0.2–1.0)
pH, UA: 6.5 (ref 5.0–7.5)

## 2018-03-12 DIAGNOSIS — M5416 Radiculopathy, lumbar region: Secondary | ICD-10-CM | POA: Diagnosis not present

## 2018-03-12 DIAGNOSIS — M5136 Other intervertebral disc degeneration, lumbar region: Secondary | ICD-10-CM | POA: Diagnosis not present

## 2018-03-12 DIAGNOSIS — M48062 Spinal stenosis, lumbar region with neurogenic claudication: Secondary | ICD-10-CM | POA: Diagnosis not present

## 2018-03-21 ENCOUNTER — Other Ambulatory Visit: Payer: Self-pay | Admitting: Family Medicine

## 2018-03-21 ENCOUNTER — Other Ambulatory Visit (HOSPITAL_COMMUNITY): Payer: Self-pay | Admitting: Physical Medicine and Rehabilitation

## 2018-03-21 DIAGNOSIS — M5442 Lumbago with sciatica, left side: Secondary | ICD-10-CM

## 2018-03-23 ENCOUNTER — Ambulatory Visit
Admission: RE | Admit: 2018-03-23 | Discharge: 2018-03-23 | Disposition: A | Discharge status: Home / Self Care | Payer: Medicare HMO | Source: Ambulatory Visit | Attending: Physical Medicine and Rehabilitation | Admitting: Physical Medicine and Rehabilitation

## 2018-03-23 DIAGNOSIS — M4186 Other forms of scoliosis, lumbar region: Secondary | ICD-10-CM | POA: Insufficient documentation

## 2018-03-23 DIAGNOSIS — M5416 Radiculopathy, lumbar region: Secondary | ICD-10-CM | POA: Diagnosis not present

## 2018-03-23 DIAGNOSIS — M48061 Spinal stenosis, lumbar region without neurogenic claudication: Secondary | ICD-10-CM | POA: Insufficient documentation

## 2018-03-23 DIAGNOSIS — M5442 Lumbago with sciatica, left side: Secondary | ICD-10-CM

## 2018-03-23 DIAGNOSIS — M5126 Other intervertebral disc displacement, lumbar region: Secondary | ICD-10-CM | POA: Insufficient documentation

## 2018-03-23 DIAGNOSIS — M545 Low back pain: Secondary | ICD-10-CM | POA: Diagnosis not present

## 2018-03-26 ENCOUNTER — Other Ambulatory Visit: Payer: Self-pay | Admitting: Nurse Practitioner

## 2018-03-27 DIAGNOSIS — M5116 Intervertebral disc disorders with radiculopathy, lumbar region: Secondary | ICD-10-CM | POA: Diagnosis not present

## 2018-03-27 DIAGNOSIS — M4156 Other secondary scoliosis, lumbar region: Secondary | ICD-10-CM | POA: Diagnosis not present

## 2018-03-27 DIAGNOSIS — M76899 Other specified enthesopathies of unspecified lower limb, excluding foot: Secondary | ICD-10-CM | POA: Diagnosis not present

## 2018-03-28 ENCOUNTER — Other Ambulatory Visit: Payer: Self-pay | Admitting: Neurological Surgery

## 2018-03-28 DIAGNOSIS — M76899 Other specified enthesopathies of unspecified lower limb, excluding foot: Secondary | ICD-10-CM

## 2018-03-29 ENCOUNTER — Telehealth: Payer: Self-pay | Admitting: Family Medicine

## 2018-03-30 NOTE — Telephone Encounter (Signed)
lvm [phone] @ 2:33 informing pt that prescription has been sent to pharmacy and that Dr Sanda Klein is asking pt to schedule an appt.

## 2018-03-30 NOTE — Telephone Encounter (Signed)
Patient needs an appointment me please I've not seen her in over 6 months

## 2018-04-01 ENCOUNTER — Ambulatory Visit: Payer: Medicare HMO

## 2018-04-10 ENCOUNTER — Ambulatory Visit: Payer: Medicare HMO

## 2018-04-18 DIAGNOSIS — M545 Low back pain: Secondary | ICD-10-CM | POA: Diagnosis not present

## 2018-04-18 DIAGNOSIS — G8929 Other chronic pain: Secondary | ICD-10-CM | POA: Diagnosis not present

## 2018-04-20 ENCOUNTER — Ambulatory Visit
Admission: RE | Admit: 2018-04-20 | Discharge: 2018-04-20 | Disposition: A | Discharge status: Home / Self Care | Payer: Medicare HMO | Source: Ambulatory Visit | Attending: Neurological Surgery | Admitting: Neurological Surgery

## 2018-04-20 DIAGNOSIS — S73102A Unspecified sprain of left hip, initial encounter: Secondary | ICD-10-CM | POA: Diagnosis not present

## 2018-04-20 DIAGNOSIS — M76899 Other specified enthesopathies of unspecified lower limb, excluding foot: Secondary | ICD-10-CM | POA: Diagnosis not present

## 2018-04-20 DIAGNOSIS — M1612 Unilateral primary osteoarthritis, left hip: Secondary | ICD-10-CM | POA: Diagnosis not present

## 2018-04-20 DIAGNOSIS — K5730 Diverticulosis of large intestine without perforation or abscess without bleeding: Secondary | ICD-10-CM | POA: Insufficient documentation

## 2018-04-20 DIAGNOSIS — X58XXXA Exposure to other specified factors, initial encounter: Secondary | ICD-10-CM | POA: Insufficient documentation

## 2018-04-25 DIAGNOSIS — M1612 Unilateral primary osteoarthritis, left hip: Secondary | ICD-10-CM | POA: Diagnosis not present

## 2018-04-25 DIAGNOSIS — M869 Osteomyelitis, unspecified: Secondary | ICD-10-CM | POA: Diagnosis not present

## 2018-04-25 DIAGNOSIS — M545 Low back pain: Secondary | ICD-10-CM | POA: Diagnosis not present

## 2018-04-25 DIAGNOSIS — M25552 Pain in left hip: Secondary | ICD-10-CM | POA: Diagnosis not present

## 2018-04-25 DIAGNOSIS — M169 Osteoarthritis of hip, unspecified: Secondary | ICD-10-CM | POA: Diagnosis not present

## 2018-04-25 DIAGNOSIS — G8929 Other chronic pain: Secondary | ICD-10-CM | POA: Diagnosis not present

## 2018-04-25 DIAGNOSIS — D7589 Other specified diseases of blood and blood-forming organs: Secondary | ICD-10-CM | POA: Diagnosis not present

## 2018-04-30 ENCOUNTER — Other Ambulatory Visit: Payer: Self-pay | Admitting: Family Medicine

## 2018-04-30 DIAGNOSIS — I10 Essential (primary) hypertension: Secondary | ICD-10-CM

## 2018-04-30 NOTE — Telephone Encounter (Signed)
Lab Results  Component Value Date   CREATININE 0.92 03/06/2018   Lab Results  Component Value Date   ALT 13 08/16/2017   ALT 96 (H) 03/13/2013   Lab Results  Component Value Date   CHOL 133 03/06/2018   HDL 38 (L) 03/06/2018   LDLCALC 61 03/06/2018   TRIG 169 (H) 03/06/2018   CHOLHDL 3.5 03/06/2018

## 2018-05-07 DIAGNOSIS — M545 Low back pain: Secondary | ICD-10-CM | POA: Diagnosis not present

## 2018-05-14 DIAGNOSIS — M5416 Radiculopathy, lumbar region: Secondary | ICD-10-CM | POA: Diagnosis not present

## 2018-05-14 DIAGNOSIS — M62830 Muscle spasm of back: Secondary | ICD-10-CM | POA: Diagnosis not present

## 2018-05-14 DIAGNOSIS — M5126 Other intervertebral disc displacement, lumbar region: Secondary | ICD-10-CM | POA: Diagnosis not present

## 2018-05-14 DIAGNOSIS — M48062 Spinal stenosis, lumbar region with neurogenic claudication: Secondary | ICD-10-CM | POA: Diagnosis not present

## 2018-05-14 DIAGNOSIS — M1612 Unilateral primary osteoarthritis, left hip: Secondary | ICD-10-CM | POA: Diagnosis not present

## 2018-05-14 DIAGNOSIS — M5136 Other intervertebral disc degeneration, lumbar region: Secondary | ICD-10-CM | POA: Diagnosis not present

## 2018-05-16 ENCOUNTER — Telehealth: Payer: Self-pay | Admitting: Family Medicine

## 2018-05-17 NOTE — Telephone Encounter (Signed)
I have not seen patient since April 2019 She has seen the nurse practitioner, however Lab Results  Component Value Date   CREATININE 0.92 03/06/2018   Lab Results  Component Value Date   HGBA1C 5.2 08/16/2017   Please see my note from Mar 29, 2018

## 2018-05-18 DIAGNOSIS — M545 Low back pain: Secondary | ICD-10-CM | POA: Diagnosis not present

## 2018-05-18 DIAGNOSIS — G8929 Other chronic pain: Secondary | ICD-10-CM | POA: Diagnosis not present

## 2018-05-18 NOTE — Telephone Encounter (Signed)
LVM for pt to call the office and schedule an appt

## 2018-05-24 ENCOUNTER — Ambulatory Visit (INDEPENDENT_AMBULATORY_CARE_PROVIDER_SITE_OTHER): Payer: Medicare HMO | Admitting: Nurse Practitioner

## 2018-05-24 ENCOUNTER — Encounter: Payer: Self-pay | Admitting: Nurse Practitioner

## 2018-05-24 VITALS — BP 124/76 | HR 79 | Temp 98.3°F | Resp 12 | Ht 62.0 in | Wt 218.1 lb

## 2018-05-24 DIAGNOSIS — R7303 Prediabetes: Secondary | ICD-10-CM | POA: Diagnosis not present

## 2018-05-24 DIAGNOSIS — J432 Centrilobular emphysema: Secondary | ICD-10-CM | POA: Diagnosis not present

## 2018-05-24 DIAGNOSIS — Z713 Dietary counseling and surveillance: Secondary | ICD-10-CM

## 2018-05-24 DIAGNOSIS — E669 Obesity, unspecified: Secondary | ICD-10-CM | POA: Diagnosis not present

## 2018-05-24 DIAGNOSIS — G8929 Other chronic pain: Secondary | ICD-10-CM | POA: Diagnosis not present

## 2018-05-24 DIAGNOSIS — M1612 Unilateral primary osteoarthritis, left hip: Secondary | ICD-10-CM | POA: Diagnosis not present

## 2018-05-24 DIAGNOSIS — M545 Low back pain: Secondary | ICD-10-CM | POA: Diagnosis not present

## 2018-05-24 DIAGNOSIS — Z1239 Encounter for other screening for malignant neoplasm of breast: Secondary | ICD-10-CM | POA: Diagnosis not present

## 2018-05-24 DIAGNOSIS — M25552 Pain in left hip: Secondary | ICD-10-CM | POA: Diagnosis not present

## 2018-05-24 MED ORDER — TIOTROPIUM BROMIDE MONOHYDRATE 18 MCG IN CAPS: ORAL_CAPSULE | RESPIRATORY_TRACT | 3 refills | Status: DC

## 2018-05-24 MED ORDER — ALBUTEROL SULFATE HFA 108 (90 BASE) MCG/ACT IN AERS: INHALATION_SPRAY | RESPIRATORY_TRACT | 1 refills | Status: DC

## 2018-05-24 NOTE — Progress Notes (Signed)
Name: Susan [name]   MRN: [id number]    DOB: Nov 21, 1960   Date:05/24/2018       Progress Note  Subjective  Chief Complaint  Chief Complaint  Patient presents with  . Follow-up    HPI  Patient states is here for follow-up on weight loss and prediabetes was put on victoza over a year ago. States has lost 20 pounds over the last year but has been watching diet and is frustrated because she is not losing weight anymore. Has has multiple orthopedic injuries over the past year limiting ability to exercise.  Diet:  Morning: banana granola Lunch: peanut butter sandwich Dinner: small portion of meat, sweet potatoes and green beans Watching portions; keep food log. Drink about a gallon of lightly sweetened tea a day and increase water recently to 48 ounces daily.  Snacks on veggies.   Activity: walking the dogs a block 4-5 times a day, goes to gym and bikes 3 miles- was going 5 days a week but hasnt been this week due to hip pain. Plans to start yoga or water program.  Wt Readings from Last 3 Encounters:  05/24/18 218 lb 1.6 oz (98.9 kg)  03/06/18 214 lb 6.4 oz (97.3 kg)  01/23/18 219 lb 8 oz (99.6 kg)   Lab Results  Component Value Date   HGBA1C 5.2 08/16/2017    COPD: hasnt been using daily inhaler in a while, taking albuterol as needed states has needed it more often about twice a week the last month.   Patient Active Problem List   Diagnosis Date Noted  . Aortic atherosclerosis ([place]) 02/20/2018  . Centrilobular emphysema ([place]) 02/20/2018  . Encounter for screening for HIV 05/16/2017  . Dyslipidemia 03/28/2017  . Cocaine use disorder ([place]) 02/27/2017  . Low HDL (under 40) 10/14/2016  . Degenerative disc disease at L5-S1 level 09/28/2016  . 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 Tobacco abuse 04/21/2016  . Cracked skin on feet 01/19/2016  . Diastasis recti 12/18/2015  . GERD (gastroesophageal reflux disease) 12/17/2015  . Chronic back pain  10/09/2015  . Post-traumatic osteoarthritis of right knee 09/24/2015  . Morbid obesity ([place]) 08/06/2015  . Pain in right knee 08/03/2015  . Postmenopausal 07/08/2015  . Cervical cancer screening 07/08/2015  . Alcoholism in recovery ([place]) 04/13/2015  . Impaired fasting glucose 04/10/2015  . Need for Streptococcus pneumoniae and influenza vaccination 04/09/2015  . Vitamin D deficiency 04/09/2015  . Vitamin B12 deficiency 04/09/2015  . Medication monitoring encounter 04/09/2015  . OSA on CPAP 04/09/2015  . Breast cancer screening 04/09/2015  . Subclinical hyperthyroidism 06/26/2014  . Toxic multinodular goiter 06/26/2014  . Hypertension 06/26/2014  . Hepatitis C 06/26/2014  . Bipolar disorder ([place]) 06/26/2014  . Throat dryness 05/20/2014  . Lumbar radiculitis 10/03/2013  . Diverticulosis 08/24/2013  . Recurrent ventral hernia 11/08/2012  . Incisional hernia 11/08/2012    Past Medical History:  Diagnosis Date  . Anxiety   . Aortic atherosclerosis ([place]) 02/20/2018   Chest CT Sept 2019  . Arthritis    rheumatoid arthritis  . Asthma   . Bipolar disorder ([place])   . Centrilobular emphysema ([place]) 02/20/2018   Chest CT Sept 2019  . Degenerative disc disease at L5-S1 level 09/28/2016   See ortho note May 2018  . Depression    bipolar, hx of suicide attempt  . Dyspnea    with exertion  . GERD (gastroesophageal reflux disease)   . H/O suicide attempt  slit wrists  . History of alcohol abuse   . History of diverticulitis 2013  . History of hepatitis C    HEP "C"--three years ago  . History of MRSA infection 2013  . Hypertension   . Hypothyroidism   . 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 Multinodular thyroid   . OSA (obstructive sleep apnea)    managed by Dr. Manuella Ghazi  . Osteoporosis   . Post-traumatic osteoarthritis of right knee 09/24/2015  . Sleep apnea     Dx 3 years ago. Use C-PAP  . Thyroid disease    Goiter  . Vitamin B12 deficiency   . Vitamin D deficiency disease     Past  Surgical History:  Procedure Laterality Date  . BILATERAL SALPINGOOPHORECTOMY     due to abnormal mass  . BREAST BIOPSY Left    neg  . BREAST SURGERY Left 20 yrs ago  . CESAREAN SECTION    . COLONOSCOPY    . COLONOSCOPY WITH PROPOFOL N/A 10/16/2017   Procedure: COLONOSCOPY WITH PROPOFOL;  Surgeon: Jonathon Bellows, MD;  Location: [facility] ENDOSCOPY;  Service: Gastroenterology;  Laterality: N/A;  . HERNIA REPAIR  06/2011, July 2014   Ventral wall repair with Physiomesh  . TONSILLECTOMY    . TOTAL KNEE ARTHROPLASTY Right 05/10/2016   Procedure: TOTAL KNEE ARTHROPLASTY;  Surgeon: Corky Mull, MD;  Location: [facility] ORS;  Service: Orthopedics;  Laterality: Right;  . TUBAL LIGATION      Social History   Tobacco Use  . Smoking status: Current Every Day Smoker    Packs/day: 0.50    Years: 41.00    Pack years: 20.50    Types: Cigarettes  . Smokeless tobacco: Never Used  Substance Use Topics  . Alcohol use: No    Alcohol/week: 0.0 standard drinks    Comment: sober since October 2018     Current Outpatient Medications:  .  albuterol (PROVENTIL HFA;VENTOLIN HFA) 108 (90 Base) MCG/ACT inhaler, Inhale 2-4 puffs by mouth every 4 hours as needed for wheezing, cough, and/or shortness of breath, Disp: 1 Inhaler, Rfl: 1 .  amLODipine (NORVASC) 5 MG tablet, TAKE 1 TABLET (5 MG TOTAL) BY MOUTH DAILY., Disp: 90 tablet, Rfl: 1 .  atenolol (TENORMIN) 25 MG tablet, TAKE 1 TABLET EVERY DAY, Disp: 90 tablet, Rfl: 1 .  atorvastatin (LIPITOR) 10 MG tablet, TAKE 1 TABLET (10 MG TOTAL) BY MOUTH AT BEDTIME., Disp: 90 tablet, Rfl: 1 .  clotrimazole (LOTRIMIN) 1 % cream, Apply 1 application topically 2 (two) times daily. (Patient taking differently: Apply 1 application topically as needed. ), Disp: 30 g, Rfl: 0 .  cyclobenzaprine (FLEXERIL) 5 MG tablet, Take 5 mg by mouth at bedtime. , Disp: , Rfl:  .  DULoxetine (CYMBALTA) 60 MG capsule, Take 60 mg by mouth 2 (two) times daily. , Disp: , Rfl:  .  econazole nitrate 1 %  cream, Apply 1 application topically daily., Disp: , Rfl:  .  gabapentin (NEURONTIN) 300 MG capsule, Take 600 mg by mouth 2 (two) times daily. , Disp: , Rfl:  .  HYDROcodone-acetaminophen (NORCO/VICODIN) 5-325 MG tablet, One-half to one pill by mouth daily PRN ([facility]), Disp: , Rfl:  .  liraglutide ([name]) 18 MG/3ML SOPN, Inject 0.3 mLs (1.8 mg total) into the skin daily. Appointment needed for further refills please, Disp: 5 pen, Rfl: 0 .  losartan (COZAAR) 100 MG tablet, TAKE 1 TABLET EVERY DAY, Disp: 90 tablet, Rfl: 1 .  Insulin Pen Needle (ULTICARE MINI PEN NEEDLES) 31G  X 6 MM MISC, FOR USE WITH SAXENDA PEN FOR DAILY INJECTIONS, Disp: 90 each, Rfl: 1 .  naproxen (NAPROSYN) 500 MG tablet, Take 500 mg by mouth 2 (two) times daily with a meal., Disp: , Rfl:  .  predniSONE (STERAPRED UNI-PAK 48 TAB) 10 MG (48) TBPK tablet, Take as directed (Patient not taking: Reported on 05/24/2018), Disp: 48 tablet, Rfl: 0  Allergies  Allergen Reactions  . Hydrochlorothiazide Other (See Comments)    Electrolyte imbalance  . Lasix [Furosemide]     Electrolyte imbalance    ROS  No other specific complaints in a complete review of systems (except as listed in HPI above).  Objective  Vitals:   05/24/18 1054  BP: 124/76  Pulse: 79  Resp: 12  Temp: 98.3 F (36.8 C)  TempSrc: Oral  SpO2: 97%  Weight: 218 lb 1.6 oz (98.9 kg)  Height: 5\' 2"  (1.575 m)    Body mass index is 39.89 kg/m.  Nursing Note and Vital Signs reviewed.  Physical Exam Constitutional:      Appearance: Normal appearance.  HENT:     Head: Normocephalic and atraumatic.  Neck:     Musculoskeletal: Normal range of motion.  Cardiovascular:     Rate and Rhythm: Normal rate.  Pulmonary:     Effort: Pulmonary effort is normal.  Skin:    General: Skin is warm and dry.  Neurological:     General: No focal deficit present.     Mental Status: She is alert and oriented to person, place, and time.  Psychiatric:        Mood and  Affect: Mood normal.        Thought Content: Thought content normal.        No results found for this or any previous visit (from the past 48 hour(s)).  Assessment & Plan 1. Centrilobular emphysema (HCC) - tiotropium (SPIRIVA HANDIHALER) 18 MCG inhalation capsule; 2 puffs daiy  Dispense: 30 capsule; Refill: 3 - albuterol (PROVENTIL HFA;VENTOLIN HFA) 108 (90 Base) MCG/ACT inhaler; Inhale 2-4 puffs by mouth every 4 hours as needed for wheezing, cough, and/or shortness of breath  Dispense: 1 Inhaler; Refill: 1  2. Obesity (BMI 30-39.9) - Amb Ref to Medical Weight Management  3. Weight loss counseling, encounter for Stop victoza, a1c normal and no longer losing weight on this medication. Considered belqiv; patient would like to talk to weight management discuss further options and meal plans.   4. Screening for breast cancer - MM 3D SCREEN BREAST BILATERAL; Future  5. Prediabetes Last a1c shows no longer prediabetic, discussed diet and exercise, refer to me medical weight managmeent.    Face-to-face time with patient was more than 25 minutes, >50% time spent counseling and coordination of care

## 2018-05-24 NOTE — Patient Instructions (Signed)
-   You should hear from weight management within the month, please let us know if you do not. I do not think you need to be on the victoza anymore- last A1C was good and doesn't seem to assist in weight loss anymore.  - Continue to drink lots of water at least 64 ounces a day.  Please do call to schedule your mammogram; the number to schedule one at either [facility] or [facility] is [phone]

## 2018-05-28 DIAGNOSIS — M545 Low back pain: Secondary | ICD-10-CM | POA: Diagnosis not present

## 2018-05-28 DIAGNOSIS — G8929 Other chronic pain: Secondary | ICD-10-CM | POA: Diagnosis not present

## 2018-05-31 ENCOUNTER — Other Ambulatory Visit: Payer: Self-pay | Admitting: Family Medicine

## 2018-05-31 MED ORDER — DULOXETINE HCL 60 MG PO CPEP: 60.0000 mg | ORAL_CAPSULE | Freq: Two times a day (BID) | ORAL | 1 refills | Status: DC

## 2018-05-31 NOTE — Telephone Encounter (Signed)
Please contact pharmacy and confirm that she has been taking this high of a dose and fill history confirms regular BID use; find out who prescriber was Thank you

## 2018-05-31 NOTE — Telephone Encounter (Signed)
Copied from [place] [id number]. Topic: Quick Communication - Rx Refill/Question >> May 31, 2018  9:30 AM Bea Graff, NT wrote: Medication: DULoxetine (CYMBALTA) 60 MG capsule  Pt states she no longer sees [facility] and wants to see If Dr. Sanda Klein will start filling this med for her.   Has the patient contacted their pharmacy? Yes.   (Agent: If no, request that the patient contact the pharmacy for the refill.) (Agent: If yes, when and what did the pharmacy advise?)  Preferred Pharmacy (with phone number or street name): [id number] - [place], [place] - [address] [phone] (Phone) [phone] (Fax)    Agent: Please be advised that RX refills may take up to 3 business days. We ask that you follow-up with your pharmacy.

## 2018-05-31 NOTE — Telephone Encounter (Signed)
Are you willing to prescribe it says historical?

## 2018-05-31 NOTE — Telephone Encounter (Signed)
Left detailed voicemail to pharmcy

## 2018-05-31 NOTE — Telephone Encounter (Signed)
Pharmacy called back, dosage is correct and last prescriber was DR. Ward at [facility]

## 2018-05-31 NOTE — Telephone Encounter (Signed)
Thank you Rx approved

## 2018-06-04 DIAGNOSIS — G4733 Obstructive sleep apnea (adult) (pediatric): Secondary | ICD-10-CM | POA: Diagnosis not present

## 2018-06-08 DIAGNOSIS — G8929 Other chronic pain: Secondary | ICD-10-CM | POA: Diagnosis not present

## 2018-06-08 DIAGNOSIS — M545 Low back pain: Secondary | ICD-10-CM | POA: Diagnosis not present

## 2018-06-11 DIAGNOSIS — M545 Low back pain: Secondary | ICD-10-CM | POA: Diagnosis not present

## 2018-06-18 DIAGNOSIS — M545 Low back pain: Secondary | ICD-10-CM | POA: Diagnosis not present

## 2018-06-18 DIAGNOSIS — G8929 Other chronic pain: Secondary | ICD-10-CM | POA: Diagnosis not present

## 2018-06-21 DIAGNOSIS — M545 Low back pain: Secondary | ICD-10-CM | POA: Diagnosis not present

## 2018-06-21 DIAGNOSIS — G8929 Other chronic pain: Secondary | ICD-10-CM | POA: Diagnosis not present

## 2018-06-25 DIAGNOSIS — M545 Low back pain: Secondary | ICD-10-CM | POA: Diagnosis not present

## 2018-06-25 DIAGNOSIS — G8929 Other chronic pain: Secondary | ICD-10-CM | POA: Diagnosis not present

## 2018-07-02 DIAGNOSIS — G8929 Other chronic pain: Secondary | ICD-10-CM | POA: Diagnosis not present

## 2018-07-02 DIAGNOSIS — M545 Low back pain: Secondary | ICD-10-CM | POA: Diagnosis not present

## 2018-07-03 ENCOUNTER — Other Ambulatory Visit: Payer: Self-pay | Admitting: Nurse Practitioner

## 2018-07-03 DIAGNOSIS — J432 Centrilobular emphysema: Secondary | ICD-10-CM

## 2018-07-05 DIAGNOSIS — M545 Low back pain: Secondary | ICD-10-CM | POA: Diagnosis not present

## 2018-07-05 DIAGNOSIS — G8929 Other chronic pain: Secondary | ICD-10-CM | POA: Diagnosis not present

## 2018-07-12 ENCOUNTER — Encounter (INDEPENDENT_AMBULATORY_CARE_PROVIDER_SITE_OTHER): Payer: Medicare HMO

## 2018-07-19 ENCOUNTER — Other Ambulatory Visit: Payer: Self-pay

## 2018-07-19 ENCOUNTER — Ambulatory Visit (INDEPENDENT_AMBULATORY_CARE_PROVIDER_SITE_OTHER): Payer: Medicare HMO

## 2018-07-19 VITALS — BP 118/62 | HR 68 | Temp 97.8°F | Resp 16 | Ht 62.0 in | Wt 232.3 lb

## 2018-07-19 DIAGNOSIS — Z1231 Encounter for screening mammogram for malignant neoplasm of breast: Secondary | ICD-10-CM

## 2018-07-19 DIAGNOSIS — Z0000 Encounter for general adult medical examination without abnormal findings: Secondary | ICD-10-CM | POA: Diagnosis not present

## 2018-07-19 NOTE — Progress Notes (Addendum)
Subjective:   Susan [name] is a 58 y.o. female who presents for an Initial Medicare Annual Wellness Visit.  Review of Systems      Cardiac Risk Factors include: advanced age (>25men, >15 women);dyslipidemia;hypertension;obesity (BMI >30kg/m2)     Objective:    Today's Vitals   07/19/18 1249  BP: 118/62  Pulse: 68  Resp: 16  Temp: 97.8 F (36.6 C)  TempSrc: Oral  SpO2: 98%  Weight: 232 lb 4.8 oz (105.4 kg)  Height: 5\' 2"  (1.575 m)   Body mass index is 42.49 kg/m.  Advanced Directives 07/19/2018 10/16/2017 02/27/2017 12/16/2016 12/01/2016 10/19/2016 10/14/2016  Does Patient Have a Medical Advance Directive? No Yes No;Yes Yes Yes Yes Yes  Type of Advance Directive - Healthcare Power of Attorney - Living will;Healthcare Power of [place];Living will - [place];Living will  Does patient want to make changes to medical advance directive? - - - No - Patient declined - - -  Copy of [place] in Chart? - No - copy requested - No - copy requested - - -  Would patient like information on creating a medical advance directive? Yes (MAU/Ambulatory/Procedural Areas - Information given) - - - - - -    Current Medications (verified) Outpatient Encounter Medications as of 07/19/2018  Medication Sig  . albuterol (PROVENTIL HFA;VENTOLIN HFA) 108 (90 Base) MCG/ACT inhaler INHALE 1 TO 2 PUFFS EVERY 4 HOURS AS NEEDED FOR COUGH, WHEEZING, AND SHORTNESS OF BREATH  . amLODipine (NORVASC) 5 MG tablet TAKE 1 TABLET (5 MG TOTAL) BY MOUTH DAILY.  Marland Kitchen atenolol (TENORMIN) 25 MG tablet TAKE 1 TABLET EVERY DAY  . atorvastatin (LIPITOR) 10 MG tablet TAKE 1 TABLET (10 MG TOTAL) BY MOUTH AT BEDTIME.  . clotrimazole (LOTRIMIN) 1 % cream Apply 1 application topically 2 (two) times daily. (Patient taking differently: Apply 1 application topically as needed. )  . cyclobenzaprine (FLEXERIL) 5 MG tablet Take 5 mg by mouth at bedtime.   . DULoxetine  (CYMBALTA) 60 MG capsule Take 1 capsule (60 mg total) by mouth 2 (two) times daily.  Marland Kitchen gabapentin (NEURONTIN) 300 MG capsule Take 600 mg by mouth 2 (two) times daily.   Marland Kitchen HYDROcodone-acetaminophen (NORCO/VICODIN) 5-325 MG tablet One-half to one pill by mouth daily PRN ([facility])  . losartan (COZAAR) 100 MG tablet TAKE 1 TABLET EVERY DAY  . naproxen (NAPROSYN) 500 MG tablet Take 500 mg by mouth 2 (two) times daily with a meal.  . tiotropium (SPIRIVA HANDIHALER) 18 MCG inhalation capsule 2 puffs daiy  . [DISCONTINUED] econazole nitrate 1 % cream Apply 1 application topically daily.  . [DISCONTINUED] Insulin Pen Needle (ULTICARE MINI PEN NEEDLES) 31G X 6 MM MISC FOR USE WITH SAXENDA PEN FOR DAILY INJECTIONS   No facility-administered encounter medications on file as of 07/19/2018.     Allergies (verified) Hydrochlorothiazide and Lasix [furosemide]   History: Past Medical History:  Diagnosis Date  . Anxiety   . Aortic atherosclerosis ([place]) 02/20/2018   Chest CT Sept 2019  . Arthritis    rheumatoid arthritis  . Asthma   . Bipolar disorder ([place])   . Centrilobular emphysema ([place]) 02/20/2018   Chest CT Sept 2019  . Degenerative disc disease at L5-S1 level 09/28/2016   See ortho note May 2018  . Depression    bipolar, hx of suicide attempt  . Dyspnea    with exertion  . GERD (gastroesophageal reflux disease)   . H/O suicide attempt  slit wrists  . History of alcohol abuse   . History of diverticulitis 2013  . History of hepatitis C    HEP "C"--three years ago  . History of MRSA infection 2013  . Hypertension   . Hypothyroidism   . 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 Multinodular thyroid   . OSA (obstructive sleep apnea)    managed by Dr. Manuella Ghazi  . Osteoporosis   . Post-traumatic osteoarthritis of right knee 09/24/2015  . Sleep apnea     Dx 3 years ago. Use C-PAP  . Thyroid disease    Goiter  . Vitamin B12 deficiency   . Vitamin D deficiency disease    Past Surgical History:  Procedure  Laterality Date  . BILATERAL SALPINGOOPHORECTOMY     due to abnormal mass  . BREAST BIOPSY Left    neg  . BREAST SURGERY Left 20 yrs ago  . CESAREAN SECTION    . COLONOSCOPY    . COLONOSCOPY WITH PROPOFOL N/A 10/16/2017   Procedure: COLONOSCOPY WITH PROPOFOL;  Surgeon: Jonathon Bellows, MD;  Location: [facility] ENDOSCOPY;  Service: Gastroenterology;  Laterality: N/A;  . HERNIA REPAIR  06/2011, July 2014   Ventral wall repair with Physiomesh  . TONSILLECTOMY    . TOTAL KNEE ARTHROPLASTY Right 05/10/2016   Procedure: TOTAL KNEE ARTHROPLASTY;  Surgeon: Corky Mull, MD;  Location: [facility] ORS;  Service: Orthopedics;  Laterality: Right;  . TUBAL LIGATION     Family History  Problem Relation Age of Onset  . Alcohol abuse Father   . Heart attack Father   . Arthritis Mother   . Asthma Mother   . Mental illness Mother   . Thyroid disease Mother   . COPD Mother   . Heart disease Mother   . Congestive Heart Failure Mother   . Arthritis Brother   . Mental illness Brother   . Cancer Brother        non-hodkins lymphoma  . Alcohol abuse Sister   . Drug abuse Sister   . Mental illness Sister   . Mental illness Sister   . Fibromyalgia Sister   . Obesity Sister   . Pneumonia Sister   . Mental illness Sister   . Alcohol abuse Sister   . Drug abuse Sister   . Diabetes Neg Hx   . Stroke Neg Hx   . Breast cancer Neg Hx    Social History   Socioeconomic History  . Marital status: Widowed    Spouse name: Not on file  . Number of children: Not on file  . Years of education: Not on file  . Highest education level: Some college, no degree  Occupational History  . Not on file  Social Needs  . Financial resource strain: Not hard at all  . Food insecurity:    Worry: Never true    Inability: Never true  . Transportation needs:    Medical: No    Non-medical: No  Tobacco Use  . Smoking status: Current Every Day Smoker    Packs/day: 0.50    Years: 41.00    Pack years: 20.50    Types: Cigarettes   . Smokeless tobacco: Never Used  Substance and Sexual Activity  . Alcohol use: No    Alcohol/week: 0.0 standard drinks    Comment: sober since October 2018  . 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 Sexual activity: Not Currently  Lifestyle  . Physical activity:  Days per week: 7 days    Minutes per session: 30 min  . Stress: To some extent  Relationships  . Social connections:    Talks on phone: More than three times a week    Gets together: Three times a week    Attends religious service: More than 4 times per year    Active member of club or organization: No    Attends meetings of clubs or organizations: Never    Relationship status: Widowed  Other Topics Concern  . Not on file  Social History Narrative  . Not on file    Tobacco Counseling Ready to quit: Not Answered Counseling given: Not Answered   Clinical Intake:  Pre-visit preparation completed: Yes  Pain : No/denies pain     BMI - recorded: 42.49 Nutritional Status: BMI > 30  Obese Nutritional Risks: None Diabetes: No  How often do you need to have someone help you when you read instructions, pamphlets, or other written materials from your doctor or pharmacy?: 1 - Never What is the last grade level you completed in school?: 12th grade  Interpreter Needed?: No  Information entered by :: Clemetine Marker LPN   Activities of Daily Living In your present state of health, do you have any difficulty performing the following activities: 07/19/2018 05/24/2018  Hearing? N Y  Comment declines hearing aids -  Vision? N N  Comment wears glasses -  Difficulty concentrating or making decisions? Y N  Comment concentrating -  Walking or climbing stairs? N N  Dressing or bathing? N N  Doing errands, shopping? N N  Preparing Food and eating ? N -  Using the Toilet? N -  In the past six months, have you accidently leaked urine? N -  Do you have problems with loss of bowel control? N -   Managing your Medications? N -  Managing your Finances? N -  Housekeeping or managing your Housekeeping? N -  Some recent data might be hidden     Immunizations and Health Maintenance Immunization History  Administered Date(s) Administered  . Influenza,inj,Quad PF,6+ Mos 04/09/2015, 01/10/2018  . Influenza-Unspecified 05/26/2014, 03/30/2016  . Pneumococcal Polysaccharide-23 04/09/2015  . Tdap 03/09/2012   Health Maintenance Due  Topic Date Due  . MAMMOGRAM  03/16/2018  . PAP SMEAR-Modifier  07/08/2018    Patient Care Team: Arnetha Courser, MD as PCP - General (Family Medicine) Christene Lye, MD (General Surgery) Sharlet Salina, MD as Referring Physician (Physical Medicine and Rehabilitation) Vladimir Crofts, MD (Neurology) Gabriel Carina Betsey Holiday, MD as Consulting Physician (Endocrinology) Luna Glasgow, DO as Consulting Physician Lada, Satira Anis, MD as Referring Physician (Family Medicine)  Indicate any recent Medical Services you may have received from other than [facility] providers in the past year (date may be approximate).     Assessment:   This is a routine wellness examination for [place].  Hearing/Vision screen Hearing Screening Comments: Pt denies hearing difficulty Vision Screening Comments: Annual vision screenings done at [facility] issues and exercise activities discussed: Current Exercise Habits: Home exercise routine, Type of exercise: walking, Time (Minutes): 30, Frequency (Times/Week): 7, Weekly Exercise (Minutes/Week): 210, Intensity: Mild, Exercise limited by: orthopedic condition(s)  Goals    . DIET - INCREASE WATER INTAKE     Recommend drinking 6-8 glasses of water per day      Depression Screen PHQ 2/9 Scores 07/19/2018 05/24/2018 08/16/2017 06/13/2017 05/16/2017 02/27/2017 12/01/2016  PHQ - 2 Score 3 0 0 0  0 6 1  PHQ- 9 Score 10 0 - - - 13 -    Fall Risk Fall Risk  07/19/2018 05/24/2018 03/06/2018 01/23/2018 01/23/2018  Falls in  the past year? 1 1 No No No  Number falls in past yr: 1 1 - - -  Injury with Fall? 0 0 - - -  Risk for fall due to : History of fall(s) - - - -  Risk for fall due to: Comment - - - - -   FALL RISK PREVENTION PERTAINING TO THE HOME:  Any stairs in or around the home? Yes  If so, do they handrails? Yes   Home free of loose throw rugs in walkways, pet beds, electrical cords, etc? Yes  Adequate lighting in your home to reduce risk of falls? Yes   ASSISTIVE DEVICES UTILIZED TO PREVENT FALLS:  Life alert? No  Use of a cane, walker or w/c? No  Grab bars in the bathroom? No  Shower chair or bench in shower? No  Elevated toilet seat or a handicapped toilet? No   DME ORDERS:  DME order needed?  No   TIMED UP AND GO:  Was the test performed? Yes .  Length of time to ambulate 10 feet: 6 sec.   GAIT:  Appearance of gait: Gait stead-fast and without the use of an assistive device.   Education: Fall risk prevention has been discussed.  Intervention(s) required? No     Cognitive Function:     6CIT Screen 07/19/2018  What Year? 0 points  What month? 0 points  What time? 0 points  Count back from 20 0 points  Months in reverse 0 points  Repeat phrase 0 points  Total Score 0    Screening Tests Health Maintenance  Topic Date Due  . MAMMOGRAM  03/16/2018  . PAP SMEAR-Modifier  07/08/2018  . TETANUS/TDAP  03/09/2022  . COLONOSCOPY  10/17/2022  . INFLUENZA VACCINE  Completed  . Hepatitis C Screening  Completed  . HIV Screening  Completed    Qualifies for Shingles Vaccine? Yes . Due for Shingrix. Education has been provided regarding the importance of this vaccine. Pt has been advised to call insurance company to determine out of pocket expense. Advised may also receive vaccine at local pharmacy or Health Dept. Verbalized acceptance and understanding.  Tdap: Up to date  Flu Vaccine: Up to date  Pneumococcal Vaccine: Due for Pneumococcal vaccine. Does the patient want to  receive this vaccine today?  No . Education has been provided regarding the importance of this vaccine but still declined. Advised may receive this vaccine at local pharmacy or Health Dept. Aware to provide a copy of the vaccination record if obtained from local pharmacy or Health Dept. Verbalized acceptance and understanding.  Cancer Screenings:  Colorectal Screening: Completed 10/16/17. Repeat in 6 months due to bowel prep suboptimal, pt declines repeat screening at this time.    Mammogram: Completed 03/16/17. Repeat every year; Ordered 05/24/18. Pt provided with contact information and advised to call to schedule appt.   Bone Density: due age 61  Lung Cancer Screening: (Low Dose CT Chest recommended if Age 64-80 years, 30 pack-year currently smoking OR have quit w/in 15years.) does qualify. Due in Sept 2020.  Additional Screening:  Hepatitis C Screening: does qualify; Completed 05/10/16  Vision Screening: Recommended annual ophthalmology exams for early detection of glaucoma and other disorders of the eye. Is the patient up to date with their annual eye exam?  Yes  Who  is the provider or what is the name of the office in which the pt attends annual eye exams? [place] Screening: Recommended annual dental exams for proper oral hygiene  Community Resource Referral:  CRR required this visit?  No      Plan:    I have personally reviewed and addressed the Medicare Annual Wellness questionnaire and have noted the following in the patient's chart:  A. Medical and social history B. Use of alcohol, tobacco or illicit drugs  C. Current medications and supplements D. Functional ability and status E.  Nutritional status F.  Physical activity G. Advance directives H. List of other physicians I.  Hospitalizations, surgeries, and ER visits in previous 12 months J.  [place] such as hearing and vision if needed, cognitive and depression L. Referrals and  appointments   In addition, I have reviewed and discussed with patient certain preventive protocols, quality metrics, and best practice recommendations. A written personalized care plan for preventive services as well as general preventive health recommendations were provided to patient.   Signed,  Clemetine Marker, LPN Nurse Health Advisor   Nurse Notes: pt c/o 14 lb weight gain since discontinuing saxenda. She is trying to be mindful of eating and increase physical activity. She plans to discuss with Dr. Sanda Klein at next visit.

## 2018-07-19 NOTE — Patient Instructions (Signed)
Susan [name] , Thank you for taking time to come for your Medicare Wellness Visit. I appreciate your ongoing commitment to your health goals. Please review the following plan we discussed and let me know if I can assist you in the future.   Screening recommendations/referrals: Colonoscopy: done 10/16/17; bowel prep suboptimal and repeat screening recommended Mammogram: done 03/16/17. Please call [phone] to schedule your mammogram.   Recommended yearly ophthalmology/optometry visit for glaucoma screening and checkup Recommended yearly dental visit for hygiene and checkup  Vaccinations: Influenza vaccine: done 01/10/18 Pneumococcal vaccine: done 04/09/15 Tdap vaccine: done 03/09/12 Shingles vaccine: Shingrix discussed. Please contact your pharmacy for coverage information.   Advanced directives: Advance directive discussed with you today. I have provided a copy for you to complete at home and have notarized. Once this is complete please bring a copy in to our office so we can scan it into your chart.  Conditions/risks identified: Recommend drinking 6-8 glasses of water per day  Next appointment: Please follow up in one year for your Medicare Annual Wellness visit.    Preventive Care 40-64 Years, Female Preventive care refers to lifestyle choices and visits with your health care provider that can promote health and wellness. What does preventive care include?  A yearly physical exam. This is also called an annual well check.  Dental exams once or twice a year.  Routine eye exams. Ask your health care provider how often you should have your eyes checked.  Personal lifestyle choices, including:  Daily care of your teeth and gums.  Regular physical activity.  Eating a healthy diet.  Avoiding tobacco and drug use.  Limiting alcohol use.  Practicing safe sex.  Taking low-dose aspirin daily starting at age 79.  Taking vitamin and mineral supplements as recommended by your health  care provider. What happens during an annual well check? The services and screenings done by your health care provider during your annual well check will depend on your age, overall health, lifestyle risk factors, and family history of disease. Counseling  Your health care provider may ask you questions about your:  Alcohol use.  Tobacco use.  Drug use.  Emotional well-being.  Home and relationship well-being.  Sexual activity.  Eating habits.  Work and work Statistician.  Method of birth control.  Menstrual cycle.  Pregnancy history. Screening  You may have the following tests or measurements:  Height, weight, and BMI.  Blood pressure.  Lipid and cholesterol levels. These may be checked every 5 years, or more frequently if you are over 48 years old.  Skin check.  Lung cancer screening. You may have this screening every year starting at age 71 if you have a 30-pack-year history of smoking and currently smoke or have quit within the past 15 years.  Fecal occult blood test (FOBT) of the stool. You may have this test every year starting at age 75.  Flexible sigmoidoscopy or colonoscopy. You may have a sigmoidoscopy every 5 years or a colonoscopy every 10 years starting at age 25.  Hepatitis C blood test.  Hepatitis B blood test.  Sexually transmitted disease (STD) testing.  Diabetes screening. This is done by checking your blood sugar (glucose) after you have not eaten for a while (fasting). You may have this done every 1-3 years.  Mammogram. This may be done every 1-2 years. Talk to your health care provider about when you should start having regular mammograms. This may depend on whether you have a family history of breast cancer.  BRCA-related cancer screening. This may be done if you have a family history of breast, ovarian, tubal, or peritoneal cancers.  Pelvic exam and Pap test. This may be done every 3 years starting at age 48. Starting at age 72, this may  be done every 5 years if you have a Pap test in combination with an HPV test.  Bone density scan. This is done to screen for osteoporosis. You may have this scan if you are at high risk for osteoporosis. Discuss your test results, treatment options, and if necessary, the need for more tests with your health care provider. Vaccines  Your health care provider may recommend certain vaccines, such as:  Influenza vaccine. This is recommended every year.  Tetanus, diphtheria, and acellular pertussis (Tdap, Td) vaccine. You may need a Td booster every 10 years.  Zoster vaccine. You may need this after age 63.  Pneumococcal 13-valent conjugate (PCV13) vaccine. You may need this if you have certain conditions and were not previously vaccinated.  Pneumococcal polysaccharide (PPSV23) vaccine. You may need one or two doses if you smoke cigarettes or if you have certain conditions. Talk to your health care provider about which screenings and vaccines you need and how often you need them. This information is not intended to replace advice given to you by your health care provider. Make sure you discuss any questions you have with your health care provider. Document Released: 05/22/2015 Document Revised: 01/13/2016 Document Reviewed: 02/24/2015 Elsevier Interactive Patient Education  2017 [place] Prevention in the Home Falls can cause injuries. They can happen to people of all ages. There are many things you can do to make your home safe and to help prevent falls. What can I do on the outside of my home?  Regularly fix the edges of walkways and driveways and fix any cracks.  Remove anything that might make you trip as you walk through a door, such as a raised step or threshold.  Trim any bushes or trees on the path to your home.  Use bright outdoor lighting.  Clear any walking paths of anything that might make someone trip, such as rocks or tools.  Regularly check to see if  handrails are loose or broken. Make sure that both sides of any steps have handrails.  Any raised decks and porches should have guardrails on the edges.  Have any leaves, snow, or ice cleared regularly.  Use sand or salt on walking paths during winter.  Clean up any spills in your garage right away. This includes oil or grease spills. What can I do in the bathroom?  Use night lights.  Install grab bars by the toilet and in the tub and shower. Do not use towel bars as grab bars.  Use non-skid mats or decals in the tub or shower.  If you need to sit down in the shower, use a plastic, non-slip stool.  Keep the floor dry. Clean up any water that spills on the floor as soon as it happens.  Remove soap buildup in the tub or shower regularly.  Attach bath mats securely with double-sided non-slip rug tape.  Do not have throw rugs and other things on the floor that can make you trip. What can I do in the bedroom?  Use night lights.  Make sure that you have a light by your bed that is easy to reach.  Do not use any sheets or blankets that are too big for your bed. They  should not hang down onto the floor.  Have a firm chair that has side arms. You can use this for support while you get dressed.  Do not have throw rugs and other things on the floor that can make you trip. What can I do in the kitchen?  Clean up any spills right away.  Avoid walking on wet floors.  Keep items that you use a lot in easy-to-reach places.  If you need to reach something above you, use a strong step stool that has a grab bar.  Keep electrical cords out of the way.  Do not use floor polish or wax that makes floors slippery. If you must use wax, use non-skid floor wax.  Do not have throw rugs and other things on the floor that can make you trip. What can I do with my stairs?  Do not leave any items on the stairs.  Make sure that there are handrails on both sides of the stairs and use them. Fix  handrails that are broken or loose. Make sure that handrails are as long as the stairways.  Check any carpeting to make sure that it is firmly attached to the stairs. Fix any carpet that is loose or worn.  Avoid having throw rugs at the top or bottom of the stairs. If you do have throw rugs, attach them to the floor with carpet tape.  Make sure that you have a light switch at the top of the stairs and the bottom of the stairs. If you do not have them, ask someone to add them for you. What else can I do to help prevent falls?  Wear shoes that:  Do not have high heels.  Have rubber bottoms.  Are comfortable and fit you well.  Are closed at the toe. Do not wear sandals.  If you use a stepladder:  Make sure that it is fully opened. Do not climb a closed stepladder.  Make sure that both sides of the stepladder are locked into place.  Ask someone to hold it for you, if possible.  Clearly mark and make sure that you can see:  Any grab bars or handrails.  First and last steps.  Where the edge of each step is.  Use tools that help you move around (mobility aids) if they are needed. These include:  Canes.  Walkers.  Scooters.  Crutches.  Turn on the lights when you go into a dark area. Replace any light bulbs as soon as they burn out.  Set up your furniture so you have a clear path. Avoid moving your furniture around.  If any of your floors are uneven, fix them.  If there are any pets around you, be aware of where they are.  Review your medicines with your doctor. Some medicines can make you feel dizzy. This can increase your chance of falling. Ask your doctor what other things that you can do to help prevent falls. This information is not intended to replace advice given to you by your health care provider. Make sure you discuss any questions you have with your health care provider. Document Released: 02/19/2009 Document Revised: 10/01/2015 Document Reviewed: 05/30/2014  Elsevier Interactive Patient Education  2017 [employer].

## 2018-07-23 ENCOUNTER — Ambulatory Visit (INDEPENDENT_AMBULATORY_CARE_PROVIDER_SITE_OTHER): Payer: Medicare HMO | Admitting: Family Medicine

## 2018-07-23 ENCOUNTER — Other Ambulatory Visit: Payer: Self-pay

## 2018-07-23 ENCOUNTER — Encounter: Payer: Self-pay | Admitting: Family Medicine

## 2018-07-23 VITALS — BP 128/80 | HR 102 | Temp 97.4°F | Resp 18 | Ht 62.0 in | Wt 228.8 lb

## 2018-07-23 DIAGNOSIS — R0981 Nasal congestion: Secondary | ICD-10-CM

## 2018-07-23 DIAGNOSIS — R52 Pain, unspecified: Secondary | ICD-10-CM

## 2018-07-23 DIAGNOSIS — Z720 Tobacco use: Secondary | ICD-10-CM

## 2018-07-23 DIAGNOSIS — J358 Other chronic diseases of tonsils and adenoids: Secondary | ICD-10-CM

## 2018-07-23 DIAGNOSIS — J432 Centrilobular emphysema: Secondary | ICD-10-CM | POA: Diagnosis not present

## 2018-07-23 LAB — POCT INFLUENZA A/B
Influenza A, POC: NEGATIVE
Influenza B, POC: NEGATIVE

## 2018-07-23 LAB — POCT RAPID STREP A (OFFICE): Rapid Strep A Screen: NEGATIVE

## 2018-07-23 MED ORDER — LORATADINE 10 MG PO TABS: 10.0000 mg | ORAL_TABLET | Freq: Every day | ORAL | 11 refills | Status: DC

## 2018-07-23 NOTE — Patient Instructions (Addendum)

## 2018-07-23 NOTE — Progress Notes (Signed)
Name: Susan [name]   MRN: [id number]    DOB: Mar 19, 1961   Date:07/23/2018       Progress Note  Subjective  Chief Complaint  Chief Complaint  Patient presents with  . Sore Throat  . Allergic Rhinitis     HPI  Pt presents with concern for sore throat, rhinitis, fatigue that started about 5 days. She was out in her garden last week and noticed a scratchy throat the next day. She has since developed very sore throat, hoarse voice, fatigue, some mild subjective fevers/chills. No recent contact with anyone known to have COVID-19, no recent travel.  She does suffer from seasonal allergies this time of year on occasion.  No cough.  Emphysema: Using Spiriva daily, using albuterol PRN.  Denies shortness of breath, chest tightness, or chest pain.   Tobacco Use: She is still smoking about 1/2-1ppd.  Counseling is provided, she is working on cessation by tapering slowly.  Patient Active Problem List   Diagnosis Date Noted  . Aortic atherosclerosis ([place]) 02/20/2018  . Centrilobular emphysema ([place]) 02/20/2018  . Encounter for screening for HIV 05/16/2017  . Dyslipidemia 03/28/2017  . Cocaine use disorder ([place]) 02/27/2017  . Low HDL (under 40) 10/14/2016  . Degenerative disc disease at L5-S1 level 09/28/2016  . 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 Tobacco abuse 04/21/2016  . Cracked skin on feet 01/19/2016  . Diastasis recti 12/18/2015  . GERD (gastroesophageal reflux disease) 12/17/2015  . Chronic back pain 10/09/2015  . Post-traumatic osteoarthritis of right knee 09/24/2015  . Morbid obesity ([place]) 08/06/2015  . Pain in right knee 08/03/2015  . Postmenopausal 07/08/2015  . Cervical cancer screening 07/08/2015  . Alcoholism in recovery ([place]) 04/13/2015  . Impaired fasting glucose 04/10/2015  . Need for Streptococcus pneumoniae and influenza vaccination 04/09/2015  . Vitamin D deficiency 04/09/2015  . Vitamin B12 deficiency 04/09/2015  . Medication  monitoring encounter 04/09/2015  . OSA on CPAP 04/09/2015  . Breast cancer screening 04/09/2015  . Subclinical hyperthyroidism 06/26/2014  . Toxic multinodular goiter 06/26/2014  . Hypertension 06/26/2014  . Hepatitis C 06/26/2014  . Bipolar disorder ([place]) 06/26/2014  . Throat dryness 05/20/2014  . Lumbar radiculitis 10/03/2013  . Diverticulosis 08/24/2013  . Recurrent ventral hernia 11/08/2012  . Incisional hernia 11/08/2012    Social History   Tobacco Use  . Smoking status: Current Every Day Smoker    Packs/day: 0.50    Years: 41.00    Pack years: 20.50    Types: Cigarettes  . Smokeless tobacco: Never Used  Substance Use Topics  . Alcohol use: No    Alcohol/week: 0.0 standard drinks    Comment: sober since October 2018    Current Outpatient Medications:  .  albuterol (PROVENTIL HFA;VENTOLIN HFA) 108 (90 Base) MCG/ACT inhaler, INHALE 1 TO 2 PUFFS EVERY 4 HOURS AS NEEDED FOR COUGH, WHEEZING, AND SHORTNESS OF BREATH, Disp: 18 g, Rfl: 2 .  amLODipine (NORVASC) 5 MG tablet, TAKE 1 TABLET (5 MG TOTAL) BY MOUTH DAILY., Disp: 90 tablet, Rfl: 1 .  atenolol (TENORMIN) 25 MG tablet, TAKE 1 TABLET EVERY DAY, Disp: 90 tablet, Rfl: 1 .  atorvastatin (LIPITOR) 10 MG tablet, TAKE 1 TABLET (10 MG TOTAL) BY MOUTH AT BEDTIME., Disp: 90 tablet, Rfl: 1 .  clotrimazole (LOTRIMIN) 1 % cream, Apply 1 application topically 2 (two) times daily. (Patient taking differently: Apply 1 application topically as needed. ), Disp: 30 g, Rfl: 0 .  cyclobenzaprine (FLEXERIL) 5  MG tablet, Take 5 mg by mouth at bedtime. , Disp: , Rfl:  .  DULoxetine (CYMBALTA) 60 MG capsule, Take 1 capsule (60 mg total) by mouth 2 (two) times daily., Disp: 180 capsule, Rfl: 1 .  gabapentin (NEURONTIN) 300 MG capsule, Take 600 mg by mouth 2 (two) times daily. , Disp: , Rfl:  .  HYDROcodone-acetaminophen (NORCO/VICODIN) 5-325 MG tablet, One-half to one pill by mouth daily PRN ([facility]), Disp: , Rfl:  .  losartan (COZAAR) 100 MG  tablet, TAKE 1 TABLET EVERY DAY, Disp: 90 tablet, Rfl: 1 .  naproxen (NAPROSYN) 500 MG tablet, Take 500 mg by mouth 2 (two) times daily with a meal., Disp: , Rfl:  .  tiotropium (SPIRIVA HANDIHALER) 18 MCG inhalation capsule, 2 puffs daiy, Disp: 30 capsule, Rfl: 3  Allergies  Allergen Reactions  . Hydrochlorothiazide Other (See Comments)    Electrolyte imbalance  . Lasix [Furosemide]     Electrolyte imbalance    I personally reviewed active problem list, medication list, allergies, notes from last encounter, lab results with the patient/caregiver today.  ROS  Ten systems reviewed and is negative except as mentioned in HPI  Objective  Vitals:   07/23/18 1007  BP: 128/80  Pulse: (!) 102  Resp: 18  Temp: (!) 97.4 F (36.3 C)  TempSrc: Oral  SpO2: 92%  Weight: 228 lb 12.8 oz (103.8 kg)  Height: 5\' 2"  (1.575 m)   Body mass index is 41.85 kg/m.  Nursing Note and Vital Signs reviewed.  Physical Exam  Constitutional: Patient appears well-developed and well-nourished. No distress.  HENT: Head: Normocephalic and atraumatic. Ears: bilateral TMs with no erythema or effusion; Nose: Nose normal. Mouth/Throat: Oropharynx with moderate erythema, tonsils are +2 with very small amount of white exudate present. Eyes: Conjunctivae and EOM are normal. No scleral icterus.  Pupils are equal, round, and reactive to light.  Neck: Normal range of motion. Neck supple. No JVD present. No thyromegaly present.  Cardiovascular: Normal rate, regular rhythm and normal heart sounds.  No murmur heard. No BLE edema. Pulmonary/Chest: Effort normal and breath sounds normal. No respiratory distress. Musculoskeletal: Normal range of motion, no joint effusions. No gross deformities Neurological: Pt is alert and oriented to person, place, and time. No cranial nerve deficit. Coordination, balance, strength, speech and gait are normal.  Skin: Skin is warm and dry. No rash noted. No erythema.  Psychiatric:  Patient has a normal mood and affect. behavior is normal. Judgment and thought content normal.  No results found for this or any previous visit (from the past 72 hour(s)).  Assessment & Plan  1. Sinus congestion - loratadine (CLARITIN) 10 MG tablet; Take 1 tablet (10 mg total) by mouth daily.  Dispense: 30 tablet; Refill: 11 - Advised her symptoms are likely viral as they are accompanied by fatigue, some mild body aches.  Unlikely that this is just allergies.  She has not traveled recently and has not had any contact with known sick individuals.   2. Tonsillar exudate - POCT rapid strep A - Culture, Group A Strep  3. Body aches - POCT Influenza A/B  4. Centrilobular emphysema ([place]) - Denies any shortness of breath or chest pain/tightness. Continue Spiriva daily and albuterol PRN.  I have low suspicion for COVID19, but did advise that she is high risk for complications should she contract this virus and therefore needs to maintain the current recommendation for social distancing.  5. Tobacco abuse - Not ready to quit at this time, counseling  provided.  -Red flags and when to present for emergency care or RTC including fever >101.59F, chest pain, shortness of breath, new/worsening/un-resolving symptoms, reviewed with patient at time of visit. Follow up and care instructions discussed and provided in AVS.

## 2018-07-25 LAB — CULTURE, GROUP A STREP
MICRO NUMBER:: 323235
SPECIMEN QUALITY:: ADEQUATE

## 2018-07-26 ENCOUNTER — Ambulatory Visit (INDEPENDENT_AMBULATORY_CARE_PROVIDER_SITE_OTHER): Payer: Medicare HMO | Admitting: Family Medicine

## 2018-07-26 ENCOUNTER — Encounter (INDEPENDENT_AMBULATORY_CARE_PROVIDER_SITE_OTHER): Payer: Self-pay | Admitting: Family Medicine

## 2018-07-26 ENCOUNTER — Other Ambulatory Visit: Payer: Self-pay

## 2018-07-26 VITALS — BP 113/73 | HR 68 | Ht 63.0 in | Wt 226.0 lb

## 2018-07-26 DIAGNOSIS — Z1331 Encounter for screening for depression: Secondary | ICD-10-CM | POA: Diagnosis not present

## 2018-07-26 DIAGNOSIS — E538 Deficiency of other specified B group vitamins: Secondary | ICD-10-CM

## 2018-07-26 DIAGNOSIS — R739 Hyperglycemia, unspecified: Secondary | ICD-10-CM

## 2018-07-26 DIAGNOSIS — R5383 Other fatigue: Secondary | ICD-10-CM | POA: Diagnosis not present

## 2018-07-26 DIAGNOSIS — Z0289 Encounter for other administrative examinations: Secondary | ICD-10-CM

## 2018-07-26 DIAGNOSIS — Z6841 Body Mass Index (BMI) 40.0 and over, adult: Secondary | ICD-10-CM

## 2018-07-26 DIAGNOSIS — F5089 Other specified eating disorder: Secondary | ICD-10-CM

## 2018-07-26 DIAGNOSIS — J438 Other emphysema: Secondary | ICD-10-CM

## 2018-07-26 DIAGNOSIS — E7849 Other hyperlipidemia: Secondary | ICD-10-CM

## 2018-07-26 DIAGNOSIS — E559 Vitamin D deficiency, unspecified: Secondary | ICD-10-CM | POA: Diagnosis not present

## 2018-07-26 NOTE — Progress Notes (Signed)
.  Office: [phone]  /  Fax: [phone]   HPI:   Chief Complaint: Susan [name] (MR# [id number]) is Susan [name] 58 y.o. female who presents on 07/26/2018 for obesity evaluation and treatment. Current BMI is Body mass index is 40.03 kg/m.Marland Kitchen Susan [name] has struggled with obesity for years and has been unsuccessful in either losing weight or maintaining long term weight loss. Susan [name] attended our information session and states she is currently in the action stage of change and ready to dedicate time achieving and maintaining Susan [name] healthier weight.  Susan [name] states her desired weight loss is 86 lbs she started gaining weight when she quit alcohol and drugs her heaviest weight ever was 240 lbs. she has significant food cravings issues  she snacks frequently in the evenings she skips lunch frequently she is frequently drinking liquids with calories she has problems with excessive hunger sometimes she sometimes eats larger portions than normal  she has binge eating behaviors she struggles with emotional eating   Susan [name] reports having Susan [name] history of cocaine and alcohol abuse and was diagnosed with both binge eating and anorexia in the past.   Fatigue Susan [name] feels her energy is lower than it should be. This has worsened with weight gain and has worsened recently. Susan [name] admits to daytime somnolence and  admits to waking up still tired lately. Patient is at risk for obstructive sleep apnea. Susan [name] has Susan [name] history of symptoms of daytime fatigue. Patient generally gets 4-6 hours of sleep per night, and states they generally do not sleep well most nights. Snoring is present. Apneic episodes are not present. Epworth Sleepiness Score is 9.  Dyspnea on exertion Susan [name] notes increasing shortness of breath with exercising and seems to be worsening over time with weight gain. She notes getting out of breath sooner with activity than she used to. This has gotten worse recently. Susan [name] denies  orthopnea.  Emphysema Susan [name] is currently smoking 1/2-1 pack per day, is on Spiriva, and is followed by her PCP.  Hyperglycemia Susan [name] states she was on [name] in the past without Susan [name] history of diabetes mellitus and reports this was stopped greater than 2 months ago for unknown reasons. She denies history of pancreatitis but does have Susan [name] positive history of toxic multinodular goiter, status post radioactive iodine.  Hyperlipidemia Susan [name] has hyperlipidemia and is on Lipitor. She has been trying to improve her cholesterol levels with intensive lifestyle modification including Susan [name] low saturated fat diet, exercise and weight loss. She denies any chest pain or myalgias. She is attempting to diet control.  Vitamin D deficiency Susan [name] has Susan [name] diagnosis of Vitamin D deficiency. She is currently taking OTC Vitamin D 1000 IU daily. Susan [name] has had no recent labs.  B12 Deficiency Susan [name] has no history of weight loss surgery. She denies anemia and reports taking OTC B12 1000 mcg daily.  Disordered Eating Susan [name] has Susan [name] history of bipolar disorder and used to have anorexic behaviors.  She currently denies binge eating or restricting.  Depression Screen Susan [name]'s Food and Mood (modified PHQ-9) score was 19.  Depression screen PHQ 2/9 07/26/2018  Decreased Interest 2  Down, Depressed, Hopeless 2  PHQ - 2 Score 4  Altered sleeping 3  Tired, decreased energy 3  Change in appetite 1  Feeling bad or failure about yourself  2  Trouble concentrating 3  Moving slowly or fidgety/restless 2  Suicidal thoughts 1  PHQ-9 Score 19  Difficult doing work/chores Somewhat difficult  Some recent data might be hidden  ASSESSMENT AND PLAN:  Other fatigue - Plan: EKG 12-Lead, CBC With Differential, Folate, T3, T4, free, TSH  Other emphysema (HCC)  Hyperglycemia - Plan: Comprehensive metabolic panel, Hemoglobin A1c, Insulin, random  Other hyperlipidemia - Plan: Lipid Panel With LDL/HDL Ratio  Vitamin D deficiency  - Plan: VITAMIN D 25 Hydroxy (Vit-D Deficiency, Fractures)  B12 nutritional deficiency - Plan: Vitamin B12  Other disorder of eating  Depression screening  Class 3 severe obesity with serious comorbidity and body mass index (BMI) of 40.0 to 44.9 in adult, unspecified obesity type (HCC)  PLAN:  Fatigue Susan [name] was informed that her fatigue may be related to obesity, depression or many other causes. Labs will be ordered, and in the meanwhile Susan [name] has agreed to work on diet, exercise and weight loss to help with fatigue. Proper sleep hygiene was discussed including the need for 7-8 hours of quality sleep each night. Susan [name] sleep study was not ordered based on symptoms and Epworth score.  Dyspnea on exertion Susan [name] shortness of breath appears to be obesity related and exercise induced. She has agreed to work on weight loss and gradually increase exercise to treat her exercise induced shortness of breath. If Susan [name] follows our instructions and loses weight without improvement of her shortness of breath, we will plan to refer to pulmonology. We will monitor this condition regularly. Susan [name] agrees to this plan.  Emphysema Susan [name] was encouraged to work on decreasing tobacco and nicotine use, especially during the COVID-19 outbreak.  Hyperglycemia Fasting labs will be obtained and results with be discussed with Susan [name] in 2 weeks at her follow-up visit. In the meanwhile Susan [name] was started on Susan [name] lower simple carbohydrate diet and will work on weight loss efforts.  Hyperlipidemia Susan [name] was informed of the American Heart Association Guidelines emphasizing intensive lifestyle modifications as the first line treatment for hyperlipidemia. We discussed many lifestyle modifications today in depth, and Susan [name] will continue to work on decreasing saturated fats such as fatty red meat, butter and many fried foods. She will start her diet prescription, increase vegetables and lean protein in her diet, and continue  to work on exercise and weight loss efforts. We will check labs today.  Vitamin D Deficiency Arantza was informed that low Vitamin D levels contributes to fatigue and are associated with obesity, breast, and colon cancer. We will check labs today  and Cledith will follow-up with our clinic in 2 weeks.  B12 Deficiency Elynn will work on increasing B12 rich foods in her diet. B12 supplementation was not prescribed today. We will check labs today and will follow-up with her in the next 2 weeks.  Disordered Eating Susan [name] was referred to Dr. Mallie Mussel, our bariatric psychologist, for evaluation due to elevated PHQ-9 score and significant struggles with emotional eating.  Depression Screen Susan [name] had Susan [name] strongly positive depression screening. Depression is commonly associated with obesity and often results in emotional eating behaviors. We will monitor this closely and work on CBT to help improve the non-hunger eating patterns. Susan [name] was referred to Dr. Mallie Mussel, our bariatric psychologist, for evaluation.  Obesity Susan [name] is currently in the action stage of change and her goal is to continue with weight loss efforts She has agreed to follow the Category 2 plan. Susan [name] has been instructed to work up to Susan [name] goal of 150 minutes of combined cardio and strengthening exercise per week for weight loss and overall health benefits. We discussed the following Behavioral Modification Strategies today: increasing lean protein intake, decrease eating out, no skipping meals,  and work on meal planning and easy cooking plans.  Susan [name] has agreed to follow-up with our clinic in 2 weeks and will see Dr. Mallie Mussel in 2 weeks. She was informed of the importance of frequent follow-up visits to maximize her success with intensive lifestyle modifications for her multiple health conditions. She was informed we would discuss her lab results at her next visit unless there is Susan [name] critical issue that needs to be addressed sooner. Susan [name]  agreed to keep her next visit at the agreed upon time to discuss these results.  ALLERGIES: Allergies  Allergen Reactions   Hydrochlorothiazide Other (See Comments)    Electrolyte imbalance   Lasix [Furosemide]     Electrolyte imbalance    MEDICATIONS: Current Outpatient Medications on File Prior to Visit  Medication Sig Dispense Refill   albuterol (PROVENTIL HFA;VENTOLIN HFA) 108 (90 Base) MCG/ACT inhaler INHALE 1 TO 2 PUFFS EVERY 4 HOURS AS NEEDED FOR COUGH, WHEEZING, AND SHORTNESS OF BREATH 18 g 2   amLODipine (NORVASC) 5 MG tablet TAKE 1 TABLET (5 MG TOTAL) BY MOUTH DAILY. 90 tablet 1   atenolol (TENORMIN) 25 MG tablet TAKE 1 TABLET EVERY DAY 90 tablet 1   atorvastatin (LIPITOR) 10 MG tablet TAKE 1 TABLET (10 MG TOTAL) BY MOUTH AT BEDTIME. 90 tablet 1   cholecalciferol (VITAMIN D3) 25 MCG (1000 UT) tablet Take 1,000 Units by mouth daily.     cyclobenzaprine (FLEXERIL) 5 MG tablet Take 5 mg by mouth at bedtime.      DULoxetine (CYMBALTA) 60 MG capsule Take 1 capsule (60 mg total) by mouth 2 (two) times daily. 180 capsule 1   gabapentin (NEURONTIN) 300 MG capsule Take 600 mg by mouth 2 (two) times daily.      HYDROcodone-acetaminophen (NORCO/VICODIN) 5-325 MG tablet One-half to one pill by mouth daily PRN ([facility])     losartan (COZAAR) 100 MG tablet TAKE 1 TABLET EVERY DAY 90 tablet 1   tiotropium (SPIRIVA HANDIHALER) 18 MCG inhalation capsule 2 puffs daiy 30 capsule 3   vitamin B-12 (CYANOCOBALAMIN) 1000 MCG tablet Take 1,000 mcg by mouth daily.     clotrimazole (LOTRIMIN) 1 % cream Apply 1 application topically 2 (two) times daily. (Patient taking differently: Apply 1 application topically as needed. ) 30 g 0   No current facility-administered medications on file prior to visit.     PAST MEDICAL HISTORY: Past Medical History:  Diagnosis Date   ADD (attention deficit disorder)    Alcohol abuse    Anemia    Anxiety    Aortic atherosclerosis ([place]) 02/20/2018    Chest CT Sept 2019   Arthritis    rheumatoid arthritis   Asthma    Bipolar disorder ([place])    Centrilobular emphysema ([place]) 02/20/2018   Chest CT Sept 2019   Constipation    Degenerative disc disease at L5-S1 level 09/28/2016   See ortho note May 2018   Depression    bipolar, hx of suicide attempt   Drug use    Dyspnea    with exertion   GERD (gastroesophageal reflux disease)    H/O suicide attempt    slit wrists   Hip pain    History of alcohol abuse    History of diverticulitis 2013   History of hepatitis C    HEP "C"--three years ago   History of MRSA infection 2013   HLD (hyperlipidemia)    Hypertension    Hypothyroidism    Knee pain    MRSA carrier Nov  2013   Multinodular thyroid    OSA (obstructive sleep apnea)    managed by Dr. Manuella Ghazi   Osteoporosis    Post-traumatic osteoarthritis of right knee 09/24/2015   Prediabetes    Sleep apnea     Dx 3 years ago. Use C-PAP   Thyroid disease    Goiter   Vitamin B12 deficiency    Vitamin D deficiency disease     PAST SURGICAL HISTORY: Past Surgical History:  Procedure Laterality Date   BILATERAL SALPINGOOPHORECTOMY     due to abnormal mass   BREAST BIOPSY Left    neg   BREAST SURGERY Left 20 yrs ago   CESAREAN SECTION     COLONOSCOPY     COLONOSCOPY WITH PROPOFOL N/Susan [name] 10/16/2017   Procedure: COLONOSCOPY WITH PROPOFOL;  Surgeon: Jonathon Bellows, MD;  Location: [facility];  Service: Gastroenterology;  Laterality: N/Susan [name];   HERNIA REPAIR  06/2011, July 2014   Ventral wall repair with Physiomesh   TONSILLECTOMY     TOTAL KNEE ARTHROPLASTY Right 05/10/2016   Procedure: TOTAL KNEE ARTHROPLASTY;  Surgeon: Corky Mull, MD;  Location: [facility] ORS;  Service: Orthopedics;  Laterality: Right;   TUBAL LIGATION      SOCIAL HISTORY: Social History   Tobacco Use   Smoking status: Current Every Day Smoker    Packs/day: 0.50    Years: 41.00    Pack years: 20.50    Types: Cigarettes    Smokeless tobacco: Never Used  Substance Use Topics   Alcohol use: No    Alcohol/week: 0.0 standard drinks    Comment: sober since October 2018   Drug use: No    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    FAMILY HISTORY: Family History  Problem Relation Age of Onset   Alcohol abuse Father    Heart attack Father    Arthritis Mother    Asthma Mother    Mental illness Mother    Thyroid disease Mother    COPD Mother    Heart disease Mother    Congestive Heart Failure Mother    Alcohol abuse Mother    Eating disorder Mother    Arthritis Brother    Mental illness Brother    Cancer Brother        non-hodkins lymphoma   Alcohol abuse Sister    Drug abuse Sister    Mental illness Sister    Mental illness Sister    Fibromyalgia Sister    Obesity Sister    Pneumonia Sister    Mental illness Sister    Alcohol abuse Sister    Drug abuse Sister    Diabetes Neg Hx    Stroke Neg Hx    Breast cancer Neg Hx    ROS: Review of Systems  Constitutional: Positive for malaise/fatigue.  HENT:       Positive for decreased hearing. Positive for vision changes. Positive for wearing glasses or contacts. Positive for floaters. Positive for dentures. Positive for dry mouth. Positive for hoarseness.  Respiratory: Positive for cough, shortness of breath (with activity) and wheezing.   Cardiovascular: Negative for orthopnea.  Gastrointestinal: Positive for constipation.  Genitourinary:       Positive for polyuria.  Musculoskeletal: Positive for joint pain and myalgias.  Neurological: Positive for weakness.  Endo/Heme/Allergies: Positive for polydipsia.       Positive for very cold feet or hands. Positive for polyphagia.   PHYSICAL EXAM: Blood pressure 113/73, pulse 68, height 5\' 3"  (1.6 m), weight 226  lb (102.5 kg), SpO2 97 %. Body mass index is 40.03 kg/m. Physical Exam Vitals signs reviewed.  Constitutional:      Appearance: Normal  appearance. She is well-developed. She is obese.  HENT:     Head: Normocephalic and atraumatic.     Nose: Nose normal.  Eyes:     General: No scleral icterus. Neck:     Musculoskeletal: Normal range of motion.     Comments: Diffuse fullness over the thyroid was noted. Cardiovascular:     Rate and Rhythm: Normal rate and regular rhythm.  Pulmonary:     Effort: Pulmonary effort is normal. No respiratory distress.  Abdominal:     Palpations: Abdomen is soft.     Tenderness: There is no abdominal tenderness.  Musculoskeletal: Normal range of motion.     Comments: Range of motion normal in all four extremities.  Skin:    General: Skin is warm and dry.  Neurological:     Mental Status: She is alert and oriented to person, place, and time.     Coordination: Coordination normal.  Psychiatric:        Mood and Affect: Mood and affect normal.        Behavior: Behavior normal.   RECENT LABS AND TESTS: BMET    Component Value Date/Time   NA 141 03/06/2018 1144   NA 136 04/29/2014 1133   K 4.1 03/06/2018 1144   K 4.2 04/29/2014 1133   CL 101 03/06/2018 1144   CL 103 04/29/2014 1133   CO2 23 03/06/2018 1144   CO2 30 04/29/2014 1133   GLUCOSE 128 (H) 03/06/2018 1144   GLUCOSE 80 08/16/2017 0840   GLUCOSE 85 04/29/2014 1133   BUN 11 03/06/2018 1144   BUN 9 04/29/2014 1133   CREATININE 0.92 03/06/2018 1144   CREATININE 0.79 08/16/2017 0840   CALCIUM 9.7 03/06/2018 1144   CALCIUM 9.1 04/29/2014 1133   GFRNONAA 69 03/06/2018 1144   GFRNONAA 84 08/16/2017 0840   GFRAA 80 03/06/2018 1144   GFRAA 97 08/16/2017 0840   Lab Results  Component Value Date   HGBA1C 5.2 08/16/2017   No results found for: INSULIN CBC    Component Value Date/Time   WBC 10.0 08/16/2017 0840   RBC 4.48 08/16/2017 0840   HGB 14.2 08/16/2017 0840   HGB 14.7 10/09/2015 0933   HCT 40.4 08/16/2017 0840   HCT 42.5 10/09/2015 0933   PLT 221 08/16/2017 0840   PLT 241 10/09/2015 0933   MCV 90.2 08/16/2017  0840   MCV 90 10/09/2015 0933   MCV 91 04/29/2014 1133   MCH 31.7 08/16/2017 0840   MCHC 35.1 08/16/2017 0840   RDW 12.0 08/16/2017 0840   RDW 13.3 10/09/2015 0933   RDW 13.1 04/29/2014 1133   LYMPHSABS 3,350 08/16/2017 0840   LYMPHSABS 3.6 (H) 10/09/2015 0933   LYMPHSABS 2.3 11/20/2012 1005   MONOABS 390 10/14/2016 0903   MONOABS 0.5 11/20/2012 1005   EOSABS 440 08/16/2017 0840   EOSABS 0.2 10/09/2015 0933   EOSABS 0.2 11/20/2012 1005   BASOSABS 60 08/16/2017 0840   BASOSABS 0.1 10/09/2015 0933   BASOSABS 0.1 11/20/2012 1005   Iron/TIBC/Ferritin/ %Sat No results found for: IRON, TIBC, FERRITIN, IRONPCTSAT Lipid Panel     Component Value Date/Time   CHOL 133 03/06/2018 1144   TRIG 169 (H) 03/06/2018 1144   HDL 38 (L) 03/06/2018 1144   CHOLHDL 3.5 03/06/2018 1144   CHOLHDL 3.5 08/16/2017 0840   VLDL 27 10/14/2016  9935   LDLCALC 61 03/06/2018 1144   LDLCALC 74 08/16/2017 0840   Hepatic Function Panel     Component Value Date/Time   PROT 6.3 08/16/2017 0840   PROT 6.8 10/09/2015 0933   PROT 7.1 03/13/2013 1509   ALBUMIN 4.1 10/14/2016 0903   ALBUMIN 4.2 10/09/2015 0933   ALBUMIN 3.5 03/13/2013 1509   AST 15 08/16/2017 0840   AST 90 (H) 03/13/2013 1509   ALT 13 08/16/2017 0840   ALT 96 (H) 03/13/2013 1509   ALKPHOS 69 10/14/2016 0903   ALKPHOS 110 03/13/2013 1509   BILITOT 0.3 08/16/2017 0840   BILITOT 0.4 10/09/2015 0933   BILITOT 0.6 03/13/2013 1509      Component Value Date/Time   TSH 0.85 08/16/2017 0840   Results for Berline, Semrad Hortensia L (MRN [id number]) as of 07/26/2018 11:26  Ref. Range 08/16/2017 08:40  Vitamin D, 25-Hydroxy Latest Ref Range: 30 - 100 ng/mL 12 (L)   ECG sinus rhythm at Susan [name] rate of 74 BPM, RSR(V1), nondiagnostic.  INDIRECT CALORIMETER done today shows Susan [name] VO2 of 222 and Susan [name] REE of 1543. Her calculated basal metabolic rate is 3009 thus her basal metabolic rate is worse than expected.  OBESITY BEHAVIORAL INTERVENTION VISIT  Today's visit was  #1   Starting weight: 226 lbs Starting date: 07/26/2018 Today's weight: 226 lbs  Today's date: 07/26/2018 Total lbs lost to date: 0 At least 15 minutes were spent on discussing the following behavioral intervention visit.    07/26/2018  Height 5\' 3"  (1.6 m)  Weight 226 lb (102.5 kg)  BMI (Calculated) 40.04  BLOOD PRESSURE - SYSTOLIC 233  BLOOD PRESSURE - DIASTOLIC 73  Waist Measurement  50 inches   Body Fat % 49 %  Total Body Water (lbs) 84 lbs  RMR 1543   ASK: We discussed the diagnosis of obesity with [place] today and Emerie agreed to give [name] permission to discuss obesity behavioral modification therapy today.  ASSESS: Nashonda has the diagnosis of obesity and her BMI today is 40.04. Bristyl is in the action stage of change.   ADVISE: Countess was educated on the multiple health risks of obesity as well as the benefit of weight loss to improve her health. She was advised of the need for long term treatment and the importance of lifestyle modifications to improve her current health and to decrease her risk of future health problems.  AGREE: Multiple dietary modification options and treatment options were discussed and  Rehana agreed to follow the recommendations documented in the above note.  ARRANGE: Brynna was educated on the importance of frequent visits to treat obesity as outlined per CMS and USPSTF guidelines and agreed to schedule her next follow-up appointment today.   I, Michaelene Song, am acting as Location manager for Dennard Nip, MD   I have reviewed the above documentation for accuracy and completeness, and I agree with the above. -Dennard Nip, MD

## 2018-07-26 NOTE — Progress Notes (Unsigned)
Office: [phone]  /  Fax: [phone]    Date: 08/09/2018  Time Seen: *** Duration: *** Provider: Glennie Isle, PsyD Type of Session: Intake for Individual Therapy  Type of Contact: Face-to-face  Informed Consent: The provider's role was explained to [facility]. The provider reviewed and discussed issues of confidentiality, privacy, and limits therein. In addition to verbal informed consent, written informed consent for psychological services was obtained from [place] prior to the initial intake interview. Written consent included information concerning the practice, financial arrangements, and confidentiality and patients' rights. Since the clinic is not a 24/7 crisis center, mental health emergency resources were shared in the form of a handout, and the provider explained MyChart, e-mail, voicemail, and/or other messaging systems should be utilized only for non-emergency reasons. Mandie verbally acknowledged understanding of the aforementioned, and agreed to use mental health emergency resources discussed if needed. Moreover, Norissa agreed information may be shared with other [facility] providers as needed for coordination of care, and written consent was obtained.   Chief Complaint: Drenda was referred by [name] due to {Reason for Referrals:22136}. Per the note for the visit with [name] on 07/26/2018,  During today's appointment, [name] reported ***  Ranesha was asked to complete a questionnaire assessing various behaviors related to emotional eating. Bina endorsed the following: {gbmoodandfood:21755}.  HPI:  Per the note for the initial visit with [name] on 07/26/2018, ***. During the initial appointment with {gbproviders:21756}, Vasilisa reported experiencing the following: {HPI Sxs:22137}. During today's appointment, [name] reported ***  Mental Status Examination: Quincie arrived on time for the appointment. She presented  as appropriately dressed and groomed. Sonnet appeared her stated age and demonstrated adequate orientation to time, place, person, and purpose of the appointment. She also demonstrated appropriate eye contact. No psychomotor abnormalities or behavioral peculiarities noted. Her mood was {gbmood:21757} with congruent affect. Her thought processes were logical, linear, and goal-directed. No hallucinations, delusions, bizarre thinking or behavior reported or observed. Judgment, insight, and impulse control appeared to be grossly intact. There was no evidence of paraphasias (i.e., errors in speech, gross mispronunciations, and word substitutions), repetition deficits, or disturbances in volume or prosody (i.e., rhythm and intonation). There was no evidence of attention or memory impairments. Tangee denied current suicidal and homicidal ideation, plan, and intent.   The Montreal Cognitive Assessment (MoCA) was administered. The MoCA assesses different cognitive domains: attention and concentration, executive functions, memory, language, visuoconstructional skills, conceptual thinking, calculations, and orientation. Mazell received *** out of 30 points possible on the MoCA, which is noted in the *** range. ***[A point was added to the total score due to years of formal education being 12 years or fewer.]   Family & Psychosocial History: ***  Medical History: ***  Mental Health History: ***   Rajni denied a trauma history, including {gbtrauma:22071} abuse, as well as neglect. ***  Anniebelle reported experiencing the following: {gbintakesxs:21966}  Liviah reported experiencing worry thoughts regarding the following:   Petina denied experiencing the following: {gbsxs:21965}  She also denied history of and current suicidal ideation, plan, and intent; history of and current homicidal ideation, plan, and intent; and history of and current engagement in self-harm.  Notably, [name] endorsed item 9 (i.e., "Do  you feel that your weight problem is so hopeless that sometimes life doesn't seem worth living?") on the modified PHQ-9 during her initial appointment with [name] on 07/26/2018.   The following strengths were reported by [facility]: *** The  following strengths were observed by this provider: {gbstrengths:22223}.  Legal History: Lorretta {gblegal:22371} a history of legal involvement.   Structured Assessment Results: The Patient Health Questionnaire-9 (PHQ-9) is a self-report measure that assesses symptoms and severity of depression over the course of the last two weeks. Laterria obtained a score of *** suggesting {GBPHQ9SEVERITY:21752}. Bradee finds the endorsed symptoms to be {gbphq9difficulty:21754}.  The Generalized Anxiety Disorder-7 (GAD-7) is a brief self-report measure that assesses symptoms of anxiety over the course of the last two weeks. Chanler obtained a score of *** suggesting {gbgad7severity:21753}.   Interventions: A chart review was conducted prior to the clinical intake interview. The MoCA***, PHQ-9, and GAD-7 were administered and a clinical intake interview was completed. In addition, Kemberly was asked to complete a Mood and Food questionnaire to assess various behaviors related to emotional eating. Throughout session, empathic reflections and validation was provided. Continuing treatment with this provider was discussed and a treatment goal was established. Psychoeducation regarding emotional versus physical hunger was provided. Makynlee was given a handout to utilize between now and the next appointment to increase awareness of hunger patterns and subsequent eating. ***  Provisional DSM-5 Diagnosis: ***  Plan: Taquita appears able and willing to participate as evidenced by collaboration on a treatment goal, engagement in reciprocal conversation, and asking questions as needed for clarification. The next appointment will be scheduled in {gbweeks:21758}. The following treatment goal  was established: {gbtxgoals:21759}. For the aforementioned goal, Marquia can benefit from biweekly individual therapy sessions that are brief in duration for approximately four to six sessions. The treatment modality will be individual therapeutic services, including an eclectic therapeutic approach utilizing techniques from Cognitive Behavioral Therapy, Patient Centered Therapy, Dialectical Behavior Therapy, Acceptance and Commitment Therapy, Interpersonal Therapy, and Cognitive Restructuring. Therapeutic approach will include various interventions as appropriate, such as validation, support, mindfulness, thought defusion, reframing, psychoeducation, values assessment, and role playing. This provider will regularly review the treatment plan and medical chart to keep informed of status changes. Keiosha expressed understanding and agreement with the initial treatment plan of care.

## 2018-07-27 LAB — LIPID PANEL WITH LDL/HDL RATIO
Cholesterol, Total: 158 mg/dL (ref 100–199)
HDL: 44 mg/dL (ref >39)
LDL Calculated: 87 mg/dL (ref 0–99)
LDl/HDL Ratio: 2 ratio (ref 0.0–3.2)
Triglycerides: 134 mg/dL (ref 0–149)
VLDL Cholesterol Cal: 27 mg/dL (ref 5–40)

## 2018-07-27 LAB — HEMOGLOBIN A1C
Est. average glucose Bld gHb Est-mCnc: 123 mg/dL
Hgb A1c MFr Bld: 5.9 % — ABNORMAL HIGH (ref 4.8–5.6)

## 2018-07-27 LAB — COMPREHENSIVE METABOLIC PANEL
ALT: 19 IU/L (ref 0–32)
AST: 19 IU/L (ref 0–40)
Albumin/Globulin Ratio: 2 (ref 1.2–2.2)
Albumin: 4.7 g/dL (ref 3.8–4.9)
Alkaline Phosphatase: 80 IU/L (ref 39–117)
BUN/Creatinine Ratio: 13 (ref 9–23)
BUN: 13 mg/dL (ref 6–24)
Bilirubin Total: 0.4 mg/dL (ref 0.0–1.2)
CO2: 23 mmol/L (ref 20–29)
Calcium: 9.5 mg/dL (ref 8.7–10.2)
Chloride: 100 mmol/L (ref 96–106)
Creatinine, Ser: 0.97 mg/dL (ref 0.57–1.00)
GFR calc Af Amer: 75 mL/min/{1.73_m2} (ref >59)
GFR calc non Af Amer: 65 mL/min/{1.73_m2} (ref >59)
Globulin, Total: 2.3 g/dL (ref 1.5–4.5)
Glucose: 96 mg/dL (ref 65–99)
Potassium: 4.7 mmol/L (ref 3.5–5.2)
Sodium: 139 mmol/L (ref 134–144)
Total Protein: 7 g/dL (ref 6.0–8.5)

## 2018-07-27 LAB — CBC WITH DIFFERENTIAL
Basophils Absolute: 0.1 10*3/uL (ref 0.0–0.2)
Basos: 1 %
EOS (ABSOLUTE): 0.4 10*3/uL (ref 0.0–0.4)
Eos: 3 %
Hematocrit: 43.4 % (ref 34.0–46.6)
Hemoglobin: 15.2 g/dL (ref 11.1–15.9)
Immature Grans (Abs): 0 10*3/uL (ref 0.0–0.1)
Immature Granulocytes: 0 %
Lymphocytes Absolute: 5.1 10*3/uL — ABNORMAL HIGH (ref 0.7–3.1)
Lymphs: 43 %
MCH: 31.7 pg (ref 26.6–33.0)
MCHC: 35 g/dL (ref 31.5–35.7)
MCV: 91 fL (ref 79–97)
Monocytes Absolute: 0.6 10*3/uL (ref 0.1–0.9)
Monocytes: 5 %
Neutrophils Absolute: 5.6 10*3/uL (ref 1.4–7.0)
Neutrophils: 48 %
RBC: 4.79 x10E6/uL (ref 3.77–5.28)
RDW: 11.7 % (ref 11.7–15.4)
WBC: 11.7 10*3/uL — ABNORMAL HIGH (ref 3.4–10.8)

## 2018-07-27 LAB — VITAMIN B12: Vitamin B-12: 1442 pg/mL — ABNORMAL HIGH (ref 232–1245)

## 2018-07-27 LAB — VITAMIN D 25 HYDROXY (VIT D DEFICIENCY, FRACTURES): Vit D, 25-Hydroxy: 18.9 ng/mL — ABNORMAL LOW (ref 30.0–100.0)

## 2018-07-27 LAB — T3: T3, Total: 177 ng/dL (ref 71–180)

## 2018-07-27 LAB — T4, FREE: Free T4: 0.88 ng/dL (ref 0.82–1.77)

## 2018-07-27 LAB — INSULIN, RANDOM: INSULIN: 31 u[IU]/mL — ABNORMAL HIGH (ref 2.6–24.9)

## 2018-07-27 LAB — FOLATE: Folate: 6.5 ng/mL (ref >3.0)

## 2018-07-27 LAB — TSH: TSH: 1.43 u[IU]/mL (ref 0.450–4.500)

## 2018-07-31 ENCOUNTER — Encounter (INDEPENDENT_AMBULATORY_CARE_PROVIDER_SITE_OTHER): Payer: Self-pay

## 2018-08-06 ENCOUNTER — Other Ambulatory Visit: Payer: Self-pay | Admitting: Nurse Practitioner

## 2018-08-06 DIAGNOSIS — J432 Centrilobular emphysema: Secondary | ICD-10-CM

## 2018-08-09 ENCOUNTER — Ambulatory Visit (INDEPENDENT_AMBULATORY_CARE_PROVIDER_SITE_OTHER): Payer: Commercial Managed Care - HMO | Admitting: Family Medicine

## 2018-08-09 ENCOUNTER — Ambulatory Visit (INDEPENDENT_AMBULATORY_CARE_PROVIDER_SITE_OTHER): Payer: Medicare HMO | Admitting: Psychology

## 2018-08-14 ENCOUNTER — Other Ambulatory Visit: Payer: Self-pay | Admitting: Nurse Practitioner

## 2018-08-14 DIAGNOSIS — J432 Centrilobular emphysema: Secondary | ICD-10-CM

## 2018-08-23 ENCOUNTER — Encounter: Payer: Self-pay | Admitting: Family Medicine

## 2018-08-23 ENCOUNTER — Other Ambulatory Visit: Payer: Self-pay

## 2018-08-23 ENCOUNTER — Ambulatory Visit (INDEPENDENT_AMBULATORY_CARE_PROVIDER_SITE_OTHER): Payer: Medicare HMO | Admitting: Family Medicine

## 2018-08-23 ENCOUNTER — Telehealth: Payer: Self-pay | Admitting: Family Medicine

## 2018-08-23 ENCOUNTER — Encounter (INDEPENDENT_AMBULATORY_CARE_PROVIDER_SITE_OTHER): Payer: Self-pay

## 2018-08-23 VITALS — BP 163/87 | HR 88 | Wt 225.5 lb

## 2018-08-23 DIAGNOSIS — E161 Other hypoglycemia: Secondary | ICD-10-CM

## 2018-08-23 DIAGNOSIS — I10 Essential (primary) hypertension: Secondary | ICD-10-CM

## 2018-08-23 DIAGNOSIS — F1021 Alcohol dependence, in remission: Secondary | ICD-10-CM

## 2018-08-23 DIAGNOSIS — J301 Allergic rhinitis due to pollen: Secondary | ICD-10-CM

## 2018-08-23 DIAGNOSIS — J309 Allergic rhinitis, unspecified: Secondary | ICD-10-CM | POA: Insufficient documentation

## 2018-08-23 DIAGNOSIS — I700 Atherosclerosis of aorta: Secondary | ICD-10-CM

## 2018-08-23 DIAGNOSIS — E785 Hyperlipidemia, unspecified: Secondary | ICD-10-CM | POA: Diagnosis not present

## 2018-08-23 DIAGNOSIS — R7301 Impaired fasting glucose: Secondary | ICD-10-CM | POA: Diagnosis not present

## 2018-08-23 DIAGNOSIS — D489 Neoplasm of uncertain behavior, unspecified: Secondary | ICD-10-CM

## 2018-08-23 MED ORDER — LORATADINE 10 MG PO TABS: 10.0000 mg | ORAL_TABLET | Freq: Every day | ORAL | 3 refills | Status: DC | PRN

## 2018-08-23 NOTE — Progress Notes (Signed)
BP (!) 163/87 (BP Location: Left Arm, Patient Position: Sitting, Cuff Size: Normal)   Pulse 88   Wt 225 lb 8 oz (102.3 kg)   BMI 39.95 kg/m    Subjective:    Patient ID: Susan [name], female    DOB: [birth year], 58 y.o.   MRN: [id number]  HPI: Susan [name] is a 58 y.o. female  Chief Complaint  Patient presents with  . Follow-up  . Skin Problem    place on crown of head, onset about a month ago.    HPI Virtual Visit via Telephone/Video Note   I connected with the patient via:  telephone I verified that I am speaking with the correct person using two identifiers.  Call started: 3:53 pm Call terminated: 4:22 pm Total length of call: 29 minutes and 18 seconds   I discussed the limitations, risks, and privacy concerns of performing an evaluation and management service by telephone and the availability of in-person appointments. I explained that he/she may be responsible for charges related to this service. The patient expressed understanding and agreed to proceed.  Provider location: home, upstairs office with door closed, earphones/headset on Patient location: home Additional participants: no one   She spoke with Joelene Millin early this morning; she has a place on her head, needs referral to a dermatologist; like the one on her back that they needed to cut out; she keeps messing with it; maybe a wart or fungus, not sure, about pea sized, sprouts off into two smaller ones, like a piece of rice  Going to health and wellness center in [place]; she can't go Ambulance person; they did a bunch of blood work; she has a lot of insulin in her labs; her A1c came back as 5.9 Her vit D is low 18.9 TSH and T3 and T4 normal Insulin was elevated at 31 A1c was elevated at 5.9 Her [name] was stopped in January; she has gained a lot of weight back; no reactions, no allergies, no thyroid cancer, no MEN-2 in the family; she has two pens there and will check to make sure not expired  She is really trying to stick with her eating plan Down to 225 pounds; down from 236 pounds she thinks; she is getting discouraged because she is not seeing results; trying to work hard to do what she needs to do Allergic rhinitis; needs Rx for claritin to mail order High cholesterol; trying to avoid fried foods; she would rather work on weight loss and better eating, rather than increase medicine She is going to meeting for her alcohol and drug abuse She is still struggling with her husband passing; going to hospice for bereavement counseling; that got stopped because of COVID-19 pandemic; she can recognize things and finds ways to keep herself occupied, keeping herself from dwelling on things; brother lived with her for a few months, but he is now in [place]; family issues, he was really sick; she is trying to stay in her own lane and not project her expectations on other people; she needs to remember to just worry about her, not anybody else; live and let live; her sister is way overweight and a prescription addict; she is trying to emotionally distance; she is dealing with it; there are people there to help and she has a crisis number if needed; good network through [place]; 18 months on Saturday, no alcohol or drugs; "I'm working a good program" she says; she was just abstinent the first time, this time  she is abstinent with a program; she really likes this new way of living HTN; Bp was just checked when she just got out of the bathroom, not relaxed; BP has been 124/64 or 79; this morning was just a fluke, ran in the bedroom real quick and weighed and had arm stretched out, getting everything ready in a rush; anxiety; she'll call back if not under 140/90; still taking BP pills every day No known exposures to COVID-19 to her knowledge; she is staying safe; they disinfect at the meetings and they all sit at least 6 feet apart, good social distancing during the meeting   Fall Risk  08/23/2018 07/23/2018  07/19/2018 05/24/2018 03/06/2018  Falls in the past year? 0 1 1 1  No  Number falls in past yr: - 1 1 1  -  Injury with Fall? - 0 0 0 -  Risk for fall due to : - History of fall(s) History of fall(s) - -  Risk for fall due to: Comment - - - - -  Follow up - Falls evaluation completed - - -    Relevant past medical, surgical, family and social history reviewed Past Medical History:  Diagnosis Date  . ADD (attention deficit disorder)   . Alcohol abuse   . Anemia   . Anxiety   . Aortic atherosclerosis ([place]) 02/20/2018   Chest CT Sept 2019  . Arthritis    rheumatoid arthritis  . Asthma   . Bipolar disorder ([place])   . Centrilobular emphysema ([place]) 02/20/2018   Chest CT Sept 2019  . Constipation   . Degenerative disc disease at L5-S1 level 09/28/2016   See ortho note May 2018  . Depression    bipolar, hx of suicide attempt  . Drug use   . Dyspnea    with exertion  . GERD (gastroesophageal reflux disease)   . H/O suicide attempt    slit wrists  . Hip pain   . History of alcohol abuse   . History of diverticulitis 2013  . History of hepatitis C    HEP "C"--three years ago  . History of MRSA infection 2013  . HLD (hyperlipidemia)   . Hypertension   . Hypothyroidism   . Knee pain   . 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 Multinodular thyroid   . OSA (obstructive sleep apnea)    managed by Dr. Manuella Ghazi  . Osteoporosis   . Post-traumatic osteoarthritis of right knee 09/24/2015  . Prediabetes   . Sleep apnea     Dx 3 years ago. Use C-PAP  . Thyroid disease    Goiter  . Vitamin B12 deficiency   . Vitamin D deficiency disease    Past Surgical History:  Procedure Laterality Date  . BILATERAL SALPINGOOPHORECTOMY     due to abnormal mass  . BREAST BIOPSY Left    neg  . BREAST SURGERY Left 20 yrs ago  . CESAREAN SECTION    . COLONOSCOPY    . COLONOSCOPY WITH PROPOFOL N/A 10/16/2017   Procedure: COLONOSCOPY WITH PROPOFOL;  Surgeon: Jonathon Bellows, MD;  Location: [facility]COPY;  Service:  Gastroenterology;  Laterality: N/A;  . HERNIA REPAIR  06/2011, July 2014   Ventral wall repair with Physiomesh  . TONSILLECTOMY    . TOTAL KNEE ARTHROPLASTY Right 05/10/2016   Procedure: TOTAL KNEE ARTHROPLASTY;  Surgeon: Corky Mull, MD;  Location: [facility] ORS;  Service: Orthopedics;  Laterality: Right;  . TUBAL LIGATION     Family History  Problem Relation Age of  Onset  . Alcohol abuse Father   . Heart attack Father   . Arthritis Mother   . Asthma Mother   . Mental illness Mother   . Thyroid disease Mother   . COPD Mother   . Heart disease Mother   . Congestive Heart Failure Mother   . Alcohol abuse Mother   . Eating disorder Mother   . Arthritis Brother   . Mental illness Brother   . Cancer Brother        non-hodkins lymphoma  . Alcohol abuse Sister   . Drug abuse Sister   . Mental illness Sister   . Mental illness Sister   . Fibromyalgia Sister   . Obesity Sister   . Pneumonia Sister   . Mental illness Sister   . Alcohol abuse Sister   . Drug abuse Sister   . Diabetes Neg Hx   . Stroke Neg Hx   . Breast cancer Neg Hx    Social History   Tobacco Use  . Smoking status: Current Every Day Smoker    Packs/day: 0.50    Years: 41.00    Pack years: 20.50    Types: Cigarettes  . Smokeless tobacco: Never Used  Substance Use Topics  . Alcohol use: No    Alcohol/week: 0.0 standard drinks    Comment: sober since October 2018  . Drug use: No    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     [facility] Visit from 08/23/2018 in [facility]  AUDIT-C Score  0      Interim medical history since last visit reviewed. Allergies and medications reviewed  Review of Systems Per HPI unless specifically indicated above     Objective:    BP (!) 163/87 (BP Location: Left Arm, Patient Position: Sitting, Cuff Size: Normal)   Pulse 88   Wt 225 lb 8 oz (102.3 kg)   BMI 39.95 kg/m   Wt Readings from Last 3 Encounters:  08/23/18 225 lb 8 oz (102.3 kg)   07/26/18 226 lb (102.5 kg)  07/23/18 228 lb 12.8 oz (103.8 kg)    Physical Exam Pulmonary:     Effort: No respiratory distress.  Neurological:     Mental Status: She is alert.     Cranial Nerves: No dysarthria.  Psychiatric:        Speech: Speech is not rapid and pressured, delayed or slurred.    Results for orders placed or performed in visit on 07/26/18  Vitamin B12  Result Value Ref Range   Vitamin B-12 1,442 (H) 232 - 1,245 pg/mL  CBC With Differential  Result Value Ref Range   WBC 11.7 (H) 3.4 - 10.8 x10E3/uL   RBC 4.79 3.77 - 5.28 x10E6/uL   Hemoglobin 15.2 11.1 - 15.9 g/dL   Hematocrit 43.4 34.0 - 46.6 %   MCV 91 79 - 97 fL   MCH 31.7 26.6 - 33.0 pg   MCHC 35.0 31.5 - 35.7 g/dL   RDW 11.7 11.7 - 15.4 %   Neutrophils 48 Not Estab. %   Lymphs 43 Not Estab. %   Monocytes 5 Not Estab. %   Eos 3 Not Estab. %   Basos 1 Not Estab. %   Neutrophils Absolute 5.6 1.4 - 7.0 x10E3/uL   Lymphocytes Absolute 5.1 (H) 0.7 - 3.1 x10E3/uL   Monocytes Absolute 0.6 0.1 - 0.9 x10E3/uL   EOS (ABSOLUTE) 0.4 0.0 - 0.4 x10E3/uL   Basophils Absolute 0.1 0.0 - 0.2 x10E3/uL  Immature Granulocytes 0 Not Estab. %   Immature Grans (Abs) 0.0 0.0 - 0.1 x10E3/uL  Comprehensive metabolic panel  Result Value Ref Range   Glucose 96 65 - 99 mg/dL   BUN 13 6 - 24 mg/dL   Creatinine, Ser 0.97 0.57 - 1.00 mg/dL   GFR calc non Af Amer 65 >59 mL/min/1.73   GFR calc Af Amer 75 >59 mL/min/1.73   BUN/Creatinine Ratio 13 9 - 23   Sodium 139 134 - 144 mmol/L   Potassium 4.7 3.5 - 5.2 mmol/L   Chloride 100 96 - 106 mmol/L   CO2 23 20 - 29 mmol/L   Calcium 9.5 8.7 - 10.2 mg/dL   Total Protein 7.0 6.0 - 8.5 g/dL   Albumin 4.7 3.8 - 4.9 g/dL   Globulin, Total 2.3 1.5 - 4.5 g/dL   Albumin/Globulin Ratio 2.0 1.2 - 2.2   Bilirubin Total 0.4 0.0 - 1.2 mg/dL   Alkaline Phosphatase 80 39 - 117 IU/L   AST 19 0 - 40 IU/L   ALT 19 0 - 32 IU/L  Hemoglobin A1c  Result Value Ref Range   Hgb A1c MFr Bld 5.9 (H)  4.8 - 5.6 %   Est. average glucose Bld gHb Est-mCnc 123 mg/dL  Insulin, random  Result Value Ref Range   INSULIN 31.0 (H) 2.6 - 24.9 uIU/mL  Folate  Result Value Ref Range   Folate 6.5 >3.0 ng/mL  Lipid Panel With LDL/HDL Ratio  Result Value Ref Range   Cholesterol, Total 158 100 - 199 mg/dL   Triglycerides 134 0 - 149 mg/dL   HDL 44 >39 mg/dL   VLDL Cholesterol Cal 27 5 - 40 mg/dL   LDL Calculated 87 0 - 99 mg/dL   LDl/HDL Ratio 2.0 0.0 - 3.2 ratio  T3  Result Value Ref Range   T3, Total 177 71 - 180 ng/dL  T4, free  Result Value Ref Range   Free T4 0.88 0.82 - 1.77 ng/dL  TSH  Result Value Ref Range   TSH 1.430 0.450 - 4.500 uIU/mL  VITAMIN D 25 Hydroxy (Vit-D Deficiency, Fractures)  Result Value Ref Range   Vit D, 25-Hydroxy 18.9 (L) 30.0 - 100.0 ng/mL      Assessment & Plan:   Problem List Items Addressed This Visit      Cardiovascular and Mediastinum   Hypertension    She'll check her BP again and contact me if not under 140/90; she cites anxiety and rushing around this am as cause for single elevation; continue compliance with medicine; avoid salt      Aortic atherosclerosis (HCC)    Goal LDL less than 70; continue medicine, keep taking medicine, she'll work on weight loss, try to limit saturated fats        Respiratory   Allergic rhinitis    [facility] claritin to [facility]        Digestive   Hyperinsulinemia    Message to [name] to contact patient about abnormal labs; because she has prediabetes, will start back on [name] for now; taper up 0.6 mg daily to 1.2 mg daily and we'll hold there awaiting more news from [name]        Endocrine   Impaired fasting glucose    Start back on [name], taper by starting with 0.6 mg daily for the first week, then 1.2 mg daily; I have messaged [name] about contacting patient about other labs        Other  Dyslipidemia    Labs reviewed from March, continue statin and efforts at weight loss and healthier  eating      Alcoholism in recovery [facility])    So glad she is working a good program; 18 months of sobriety from drugs and alcohol; praise given, keep working it, don't let up       Other Visit Diagnoses    Neoplasm of uncertain behavior    -  Primary   crown of head; refer to dermatologist at patient's request   Relevant Orders   Ambulatory referral to Dermatology       Follow up plan: No follow-ups on file.  An after-visit summary was printed and given to the patient at [place].  Please see the patient instructions which may contain other information and recommendations beyond what is mentioned above in the assessment and plan.  Meds ordered this encounter  Medications  . loratadine (CLARITIN) 10 MG tablet    Sig: Take 1 tablet (10 mg total) by mouth daily as needed for allergies.    Dispense:  90 tablet    Refill:  3    Orders Placed This Encounter  Procedures  . Ambulatory referral to Dermatology

## 2018-08-23 NOTE — Assessment & Plan Note (Signed)
Message to [name] to contact patient about abnormal labs; because she has prediabetes, will start back on [name] for now; taper up 0.6 mg daily to 1.2 mg daily and we'll hold there awaiting more news from [name]

## 2018-08-23 NOTE — Assessment & Plan Note (Signed)
Labs reviewed from March, continue statin and efforts at weight loss and healthier eating

## 2018-08-23 NOTE — Assessment & Plan Note (Signed)
Start back on [name], taper by starting with 0.6 mg daily for the first week, then 1.2 mg daily; I have messaged [name] about contacting patient about other labs

## 2018-08-23 NOTE — Assessment & Plan Note (Signed)
Goal LDL less than 70; continue medicine, keep taking medicine, she'll work on weight loss, try to limit saturated fats

## 2018-08-23 NOTE — Assessment & Plan Note (Signed)
So glad she is working a good program; 18 months of sobriety from drugs and alcohol; praise given, keep working it, don't let up

## 2018-08-23 NOTE — Assessment & Plan Note (Signed)
She'll check her BP again and contact me if not under 140/90; she cites anxiety and rushing around this am as cause for single elevation; continue compliance with medicine; avoid salt

## 2018-08-23 NOTE — Telephone Encounter (Signed)
Called pt and left message to sch 53m fu per [facility]

## 2018-08-23 NOTE — Assessment & Plan Note (Signed)
Rx for claritin to [facility]

## 2018-08-23 NOTE — Telephone Encounter (Signed)
Please schedule patient for an appointment With whom: Dr. Sanda Klein When: 3 months How: In person Why: high cholesterol, prediabetes Thank you

## 2018-08-30 ENCOUNTER — Encounter (INDEPENDENT_AMBULATORY_CARE_PROVIDER_SITE_OTHER): Payer: Self-pay

## 2018-09-03 ENCOUNTER — Encounter (INDEPENDENT_AMBULATORY_CARE_PROVIDER_SITE_OTHER): Payer: Self-pay | Admitting: Family Medicine

## 2018-09-03 DIAGNOSIS — G4733 Obstructive sleep apnea (adult) (pediatric): Secondary | ICD-10-CM | POA: Diagnosis not present

## 2018-09-03 NOTE — Progress Notes (Addendum)
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 [brother]       Phone: [phone]  5.   Place: AA meeting 6.   Place: Risk analyst  Step 4: People whom I can ask for help: 1.   Name: Emmit Alexanders       Phone: [phone] 2.   Name: Eddie Dibbles [brother]       Phone: [phone]   Step 5: Professionals or agencies I can contact during a crisis 1. [facility] [where she completed IOP] [phone] 2. National Suicide Prevention Lifeline [phone] 3. [place] 24-hour Helpline 442-362-8234 or 1-209-030-5167 4. Dial 911/ go to the nearest emergency department  Step 6: Making the environment safe: 1. Keep alcohol out of the house [Lesly noted, "I do. I don't allow any drinking in my house."] 2. Keep drugs out of the house [Jourdin noted she does not have any in her home.] 3. No negative people that cause stress and anxiety  Protective factors were identified under the section of the safety plan indicating  "The one thing that is most important to me and worth living for is." The following protective factors were noted: dogs, children, brother, grandchildren, and AA family. She added, "And for myself."    Sharlot indicated she is not currently experiencing any of the  warning signs she noted. She shared, "I've definitely changed my people and places because it is not how I want to live. I want to get better." This provider encouraged Emeree to place her safety plan in a safe, yet accessible place. She acknowledged  understanding, and agreed. She does not have any access to firearms. She added, "My daughter won't let me have them, which the law would not let me have anyways and I try to abide by the law."   The following strengths were reported by Susan [name]: caring, motivated, empathic and hard worker. The following strengths were observed by this provider: ability to express thoughts and feelings during the therapeutic session, ability to establish and benefit from a therapeutic relationship, ability to learn and practice coping skills, willingness to work toward established goal(s) with the clinic and ability to engage in reciprocal conversation.  Legal History: Pranathi endorsed a history of legal involvement. She reported she received a DWI at age 36. Since then, she indicated she was "arrested several times for assault and a drug charge." The drug charged was for marjuana, and she noted, "I don't smoke marijuana. I haven't smoked since I was 84." Susan [name] noted the assault charges were while she was under the influence and "only" toward her 2nd and 3rd husband.   Structured Assessment Results: The Patient Health Questionnaire-9 (PHQ-9) is a self-report measure that assesses symptoms and severity of depression over the course of the last two weeks. Jurnei obtained a score of 7 suggesting mild depression. Germani finds the endorsed symptoms to be somewhat difficult. Depression screen PHQ 2/9 09/06/2018  Decreased Interest 1  Down, Depressed, Hopeless 1  PHQ - 2 Score 2  Altered sleeping 0  Tired, decreased energy 1  Change in appetite 1  Feeling bad or failure about yourself  1  Trouble concentrating 2  Moving slowly or fidgety/restless 0  Suicidal thoughts 0  PHQ-9 Score 7  Difficult doing work/chores -  Some recent data might be hidden   The Generalized Anxiety Disorder-7 (GAD-7) is a brief self-report measure that assesses symptoms of anxiety over the course of the last two weeks. Linday obtained a score of  14 suggesting moderate anxiety. GAD 7 : Generalized Anxiety Score 09/06/2018  Nervous, Anxious, on Edge 2  Control/stop worrying 1  Worry too much - different things 2  Trouble relaxing 3  Restless 3  Easily annoyed or irritable 2  Afraid - awful might happen 1  Total GAD 7 Score 14  Anxiety Difficulty Somewhat difficult   Interventions: A chart review was conducted prior to the clinical intake interview. The PHQ-9, and GAD-7 were administered and a clinical intake interview was completed. In addition, Maguire was asked to complete a Mood and Food questionnaire to assess various behaviors related to emotional eating. Throughout session, empathic reflections and validation was provided. A risk assessment was completed along with a safety plan. This provider recommended a referral be placed for longer-term therapeutic services. It was also recommended [place] continue meeting with this provider until she is established with a new provided. She agreed; therefore, a treatment goal was established.   Provisional DSM-5 Diagnosis: 311 (F32.8) Other Specified Depressive Disorder, Emotional Eating Behaviors  Plan: Valarie appears able and willing to participate as evidenced by collaboration on a treatment goal, engagement in reciprocal conversation, and asking questions as needed for clarification. A referral will be placed for longer-term therapeutic services. This provider recommended an appointment in one week, but Wishek declined. She noted, "Two weeks  is good. I'm very open and honest and would tell you if I needed something sooner." Thus, the next appointment will be scheduled in two weeks, which will be via [employer]. The following treatment goal was established: decrease emotional eating. Once this provider's office resumes in-person appointments, Shakaya will be notified. The treatment modality will be individual therapeutic services, including an eclectic therapeutic approach utilizing techniques from  Cognitive Behavioral Therapy, Patient Centered Therapy, Dialectical Behavior Therapy, Acceptance and Commitment Therapy, Interpersonal Therapy, and Cognitive Restructuring. Therapeutic approach will include various interventions as appropriate, such as validation, support, mindfulness, thought defusion, reframing, psychoeducation, values assessment, and role playing. This provider will regularly review the treatment plan and medical chart to keep informed of status changes. Karlita expressed understanding and agreement with the initial treatment plan of care.

## 2018-09-06 ENCOUNTER — Ambulatory Visit (INDEPENDENT_AMBULATORY_CARE_PROVIDER_SITE_OTHER): Payer: Medicare HMO | Admitting: Psychology

## 2018-09-06 ENCOUNTER — Other Ambulatory Visit: Payer: Self-pay

## 2018-09-06 DIAGNOSIS — F3289 Other specified depressive episodes: Secondary | ICD-10-CM | POA: Diagnosis not present

## 2018-09-10 DIAGNOSIS — M1612 Unilateral primary osteoarthritis, left hip: Secondary | ICD-10-CM | POA: Diagnosis not present

## 2018-09-10 DIAGNOSIS — M5126 Other intervertebral disc displacement, lumbar region: Secondary | ICD-10-CM | POA: Diagnosis not present

## 2018-09-10 DIAGNOSIS — M5416 Radiculopathy, lumbar region: Secondary | ICD-10-CM | POA: Diagnosis not present

## 2018-09-10 DIAGNOSIS — M48062 Spinal stenosis, lumbar region with neurogenic claudication: Secondary | ICD-10-CM | POA: Diagnosis not present

## 2018-09-10 DIAGNOSIS — M5136 Other intervertebral disc degeneration, lumbar region: Secondary | ICD-10-CM | POA: Diagnosis not present

## 2018-09-10 DIAGNOSIS — M62830 Muscle spasm of back: Secondary | ICD-10-CM | POA: Diagnosis not present

## 2018-09-11 ENCOUNTER — Other Ambulatory Visit: Payer: Self-pay

## 2018-09-11 ENCOUNTER — Ambulatory Visit (INDEPENDENT_AMBULATORY_CARE_PROVIDER_SITE_OTHER): Payer: Medicare HMO | Admitting: Family Medicine

## 2018-09-11 DIAGNOSIS — Z6841 Body Mass Index (BMI) 40.0 and over, adult: Secondary | ICD-10-CM

## 2018-09-11 DIAGNOSIS — E559 Vitamin D deficiency, unspecified: Secondary | ICD-10-CM

## 2018-09-11 DIAGNOSIS — R7303 Prediabetes: Secondary | ICD-10-CM

## 2018-09-11 DIAGNOSIS — E66813 Obesity, class 3: Secondary | ICD-10-CM

## 2018-09-11 MED ORDER — VITAMIN D (ERGOCALCIFEROL) 1.25 MG (50000 UNIT) PO CAPS: 50000.0000 [IU] | ORAL_CAPSULE | ORAL | 0 refills | Status: DC

## 2018-09-12 NOTE — Progress Notes (Signed)
Office: [phone]  /  Fax: [phone] TeleHealth Visit:  Susan [name] has verbally consented to this TeleHealth visit today. The patient is located at home, the provider is located at the [employer] [facility] office. The participants in this visit include the listed provider and patient. The visit was conducted today via [facility].  HPI:   Chief Complaint: OBESITY Susan [name] is here to discuss her progress with her obesity treatment plan. She is on the Category 2 plan and is following her eating plan approximately 80 % of the time. She states she is walking the dog and doing yard work. Susan [name] was 1st seen in our clinic over 6 weeks ago right before COVID19 isolation started. We reached out to her twice to schedule a Telehealth visit to discuss labs and further options. She is deviating from her plan pretty significantly, but still eating a lot healthier than previously. Part of this is due to what she already had in her fridge and freezer. She thinks that she may have lost 1 to 2 pounds since her last visit.  We were unable to weigh the patient today for this TeleHealth visit. She feels as if she has lost weight since her last visit. She has lost 0 lbs since starting treatment with [name].  Pre-Diabetes Susan [name] has a diagnosis of pre-diabetes based on her elevated Hgb A1c and was informed this puts her at greater risk of developing diabetes. Her PCP, Dr. Sanda Klein discussed her elevated A1c with her and started her on [name]. She notes her hunger has decreased, but she is struggling to eat all of her dinner. She continues to work on diet and exercise to decrease risk of diabetes.   Vitamin D Deficiency Susan [name] has a diagnosis of vitamin D deficiency. She is currently on OTC vit D and her level of 18.9 on 07/26/18 is not at goal. Susan [name] admits fatigue.  ASSESSMENT AND PLAN:  Prediabetes  Vitamin D deficiency - Plan: Vitamin D, Ergocalciferol, (DRISDOL) 1.25 MG (50000 UT) CAPS capsule  Class 3  severe obesity with serious comorbidity and body mass index (BMI) of 40.0 to 44.9 in adult, unspecified obesity type [facility])  PLAN:  Pre-Diabetes Susan [name] will continue to work on weight loss, exercise, and decreasing simple carbohydrates in her diet to help decrease the risk of diabetes. She was informed that eating too many simple carbohydrates or too many calories at one sitting increases the likelihood of GI side effects. Susan [name] agreed to continue [name] at 1.2 mg and not to increase so that she can eat all the food on her plan. Susan [name] agreed to follow up with [name] as directed to monitor her progress in 2 weeks.  Vitamin D Deficiency Susan [name] was informed that low vitamin D levels contribute to fatigue and are associated with obesity, breast, and colon cancer. Susan [name] agrees to start to take prescription Vit D @50 ,000 IU every week #4 with no refills and will follow up for routine testing of vitamin D, at least 2-3 times per year. She was informed of the risk of over-replacement of vitamin D and agrees to not increase her dose unless she discusses this with [name] first. We will recheck labs in 3 months and Susan [name] agrees to follow up in 2 weeks as directed.  Obesity Susan [name] is currently in the action stage of change. As such, her goal is to continue with weight loss efforts. She has agreed to follow the Category 2 plan. Susan [name] has been instructed to work up to a goal  of 150 minutes of combined cardio and strengthening exercise per week for weight loss and overall health benefits. We discussed the following Behavioral Modification Strategies today: work on meal planning and easy cooking plans and keeping healthy foods in the home.  Susan [name] has agreed to follow up with our clinic in 2 weeks. She was informed of the importance of frequent follow up visits to maximize her success with intensive lifestyle modifications for her multiple health conditions.  ALLERGIES: Allergies  Allergen Reactions  .  Hydrochlorothiazide Other (See Comments)    Electrolyte imbalance  . Lasix [Furosemide]     Electrolyte imbalance    MEDICATIONS: Current Outpatient Medications on File Prior to Visit  Medication Sig Dispense Refill  . albuterol (PROVENTIL HFA;VENTOLIN HFA) 108 (90 Base) MCG/ACT inhaler INHALE 1 TO 2 PUFFS EVERY 4 HOURS AS NEEDED FOR COUGH, WHEEZING, AND SHORTNESS OF BREATH 18 g 2  . amLODipine (NORVASC) 5 MG tablet TAKE 1 TABLET (5 MG TOTAL) BY MOUTH DAILY. 90 tablet 1  . atenolol (TENORMIN) 25 MG tablet TAKE 1 TABLET EVERY DAY 90 tablet 1  . atorvastatin (LIPITOR) 10 MG tablet TAKE 1 TABLET (10 MG TOTAL) BY MOUTH AT BEDTIME. 90 tablet 1  . cholecalciferol (VITAMIN D3) 25 MCG (1000 UT) tablet Take 1,000 Units by mouth daily.    . clotrimazole (LOTRIMIN) 1 % cream Apply 1 application topically 2 (two) times daily. (Patient taking differently: Apply 1 application topically as needed. ) 30 g 0  . cyclobenzaprine (FLEXERIL) 5 MG tablet Take 5 mg by mouth at bedtime.     . DULoxetine (CYMBALTA) 60 MG capsule Take 1 capsule (60 mg total) by mouth 2 (two) times daily. 180 capsule 1  . gabapentin (NEURONTIN) 300 MG capsule Take 600 mg by mouth 2 (two) times daily.     Marland Kitchen HYDROcodone-acetaminophen (NORCO/VICODIN) 5-325 MG tablet One-half to one pill by mouth daily PRN ([facility])    . loratadine (CLARITIN) 10 MG tablet Take 1 tablet (10 mg total) by mouth daily as needed for allergies. 90 tablet 3  . losartan (COZAAR) 100 MG tablet TAKE 1 TABLET EVERY DAY 90 tablet 1  . tiotropium (SPIRIVA HANDIHALER) 18 MCG inhalation capsule INHALE THE CONTENTS OF 1 CAPSULE ONE TIME DAILY 90 capsule 1  . vitamin B-12 (CYANOCOBALAMIN) 1000 MCG tablet Take 1,000 mcg by mouth daily.     No current facility-administered medications on file prior to visit.     PAST MEDICAL HISTORY: Past Medical History:  Diagnosis Date  . ADD (attention deficit disorder)   . Alcohol abuse   . Anemia   . Anxiety   . Aortic  atherosclerosis ([place]) 02/20/2018   Chest CT Sept 2019  . Arthritis    rheumatoid arthritis  . Asthma   . Bipolar disorder ([place])   . Centrilobular emphysema ([place]) 02/20/2018   Chest CT Sept 2019  . Constipation   . Degenerative disc disease at L5-S1 level 09/28/2016   See ortho note May 2018  . Depression    bipolar, hx of suicide attempt  . Drug use   . Dyspnea    with exertion  . GERD (gastroesophageal reflux disease)   . H/O suicide attempt    slit wrists  . Hip pain   . History of alcohol abuse   . History of diverticulitis 2013  . History of hepatitis C    HEP "C"--three years ago  . History of MRSA infection 2013  . HLD (hyperlipidemia)   . Hypertension   .  Hypothyroidism   . Knee pain   . 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 Multinodular thyroid   . OSA (obstructive sleep apnea)    managed by Dr. Manuella Ghazi  . Osteoporosis   . Post-traumatic osteoarthritis of right knee 09/24/2015  . Prediabetes   . Sleep apnea     Dx 3 years ago. Use C-PAP  . Thyroid disease    Goiter  . Vitamin B12 deficiency   . Vitamin D deficiency disease     PAST SURGICAL HISTORY: Past Surgical History:  Procedure Laterality Date  . BILATERAL SALPINGOOPHORECTOMY     due to abnormal mass  . BREAST BIOPSY Left    neg  . BREAST SURGERY Left 20 yrs ago  . CESAREAN SECTION    . COLONOSCOPY    . COLONOSCOPY WITH PROPOFOL N/A 10/16/2017   Procedure: COLONOSCOPY WITH PROPOFOL;  Surgeon: Jonathon Bellows, MD;  Location[facility] ENDOSCOPY;  Service: Gastroenterology;  Laterality: N/A;  . HERNIA REPAIR  06/2011, July 2014   Ventral wall repair with Physiomesh  . TONSILLECTOMY    . TOTAL KNEE ARTHROPLASTY Right 05/10/2016   Procedure: TOTAL KNEE ARTHROPLASTY;  Surgeon: Corky Mull, MD;  Location: [facility] ORS;  Service: Orthopedics;  Laterality: Right;  . TUBAL LIGATION      SOCIAL HISTORY: Social History   Tobacco Use  . Smoking status: Current Every Day Smoker    Packs/day: 0.50    Years: 41.00    Pack years:  20.50    Types: Cigarettes  . Smokeless tobacco: Never Used  Substance Use Topics  . Alcohol use: No    Alcohol/week: 0.0 standard drinks    Comment: sober since October 2018  . 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 Alcohol abuse Father   . Heart attack Father   . Arthritis Mother   . Asthma Mother   . Mental illness Mother   . Thyroid disease Mother   . COPD Mother   . Heart disease Mother   . Congestive Heart Failure Mother   . Alcohol abuse Mother   . Eating disorder Mother   . Arthritis Brother   . Mental illness Brother   . Cancer Brother        non-hodkins lymphoma  . Alcohol abuse Sister   . Drug abuse Sister   . Mental illness Sister   . Mental illness Sister   . Fibromyalgia Sister   . Obesity Sister   . Pneumonia Sister   . Mental illness Sister   . Alcohol abuse Sister   . Drug abuse Sister   . Diabetes Neg Hx   . Stroke Neg Hx   . Breast cancer Neg Hx     ROS: Review of Systems  Constitutional: Positive for malaise/fatigue.    PHYSICAL EXAM: Pt in no acute distress  RECENT LABS AND TESTS: BMET    Component Value Date/Time   NA 139 07/26/2018 1101   NA 136 04/29/2014 1133   K 4.7 07/26/2018 1101   K 4.2 04/29/2014 1133   CL 100 07/26/2018 1101   CL 103 04/29/2014 1133   CO2 23 07/26/2018 1101   CO2 30 04/29/2014 1133   GLUCOSE 96 07/26/2018 1101   GLUCOSE 80 08/16/2017 0840   GLUCOSE 85 04/29/2014 1133   BUN 13 07/26/2018 1101   BUN 9 04/29/2014 1133   CREATININE 0.97 07/26/2018 1101  CREATININE 0.79 08/16/2017 0840   CALCIUM 9.5 07/26/2018 1101   CALCIUM 9.1 04/29/2014 1133   GFRNONAA 65 07/26/2018 1101   GFRNONAA 84 08/16/2017 0840   GFRAA 75 07/26/2018 1101   GFRAA 97 08/16/2017 0840   Lab Results  Component Value Date   HGBA1C 5.9 (H) 07/26/2018   HGBA1C 5.2 08/16/2017   HGBA1C 5.6 10/14/2016   HGBA1C 5.3 04/12/2016    HGBA1C 6.1 (H) 10/09/2015   Lab Results  Component Value Date   INSULIN 31.0 (H) 07/26/2018   CBC    Component Value Date/Time   WBC 11.7 (H) 07/26/2018 1101   WBC 10.0 08/16/2017 0840   RBC 4.79 07/26/2018 1101   RBC 4.48 08/16/2017 0840   HGB 15.2 07/26/2018 1101   HCT 43.4 07/26/2018 1101   PLT 221 08/16/2017 0840   PLT 241 10/09/2015 0933   MCV 91 07/26/2018 1101   MCV 91 04/29/2014 1133   MCH 31.7 07/26/2018 1101   MCH 31.7 08/16/2017 0840   MCHC 35.0 07/26/2018 1101   MCHC 35.1 08/16/2017 0840   RDW 11.7 07/26/2018 1101   RDW 13.1 04/29/2014 1133   LYMPHSABS 5.1 (H) 07/26/2018 1101   LYMPHSABS 2.3 11/20/2012 1005   MONOABS 390 10/14/2016 0903   MONOABS 0.5 11/20/2012 1005   EOSABS 0.4 07/26/2018 1101   EOSABS 0.2 11/20/2012 1005   BASOSABS 0.1 07/26/2018 1101   BASOSABS 0.1 11/20/2012 1005   Iron/TIBC/Ferritin/ %Sat No results found for: IRON, TIBC, FERRITIN, IRONPCTSAT Lipid Panel     Component Value Date/Time   CHOL 158 07/26/2018 1101   TRIG 134 07/26/2018 1101   HDL 44 07/26/2018 1101   CHOLHDL 3.5 03/06/2018 1144   CHOLHDL 3.5 08/16/2017 0840   VLDL 27 10/14/2016 0903   LDLCALC 87 07/26/2018 1101   LDLCALC 74 08/16/2017 0840   Hepatic Function Panel     Component Value Date/Time   PROT 7.0 07/26/2018 1101   PROT 7.1 03/13/2013 1509   ALBUMIN 4.7 07/26/2018 1101   ALBUMIN 3.5 03/13/2013 1509   AST 19 07/26/2018 1101   AST 90 (H) 03/13/2013 1509   ALT 19 07/26/2018 1101   ALT 96 (H) 03/13/2013 1509   ALKPHOS 80 07/26/2018 1101   ALKPHOS 110 03/13/2013 1509   BILITOT 0.4 07/26/2018 1101   BILITOT 0.6 03/13/2013 1509      Component Value Date/Time   TSH 1.430 07/26/2018 1101   TSH 0.85 08/16/2017 0840   TSH 0.94 10/14/2016 0903   Results for SYNDA, BAGENT (MRN [id number]) as of 09/12/2018 09:13  Ref. Range 07/26/2018 11:01  Vitamin D, 25-Hydroxy Latest Ref Range: 30.0 - 100.0 ng/mL 18.9 (L)     I, Marcille Blanco, CMA, am acting as  transcriptionist for Starlyn Skeans, MD I have reviewed the above documentation for accuracy and completeness, and I agree with the above. -Dennard Nip, MD

## 2018-09-12 NOTE — Progress Notes (Signed)
Office: [phone]  /  Fax: [phone]    Date: Sep 17, 2018   Appointment Start Time: 9:08am Duration: 26 minutes Provider: Glennie Isle, Psy.D. Type of Session: Individual Therapy  Location of Patient: Home- bedroom Location of Provider: [facility] Type of Contact: Telepsychological Visit via [employer] Web[employer]   Session Content: Susan [name] is a 58 y.o. female presenting via [place] for a follow-up appointment to address the previously established treatment goal of decreasing emotional eating. Today's appointment was a telepsychological visit, as this provider's clinic is closed for in-person visits due to COVID-19. Therapeutic services will resume to in-person appointments once the clinic re-opens. Levana expressed understanding regarding the rationale for telepsychological services, and provided verbal consent for today's appointment. Prior to proceeding with today's appointment, Susan [name]'s physical location at the time of this appointment was obtained. Susan [name] reported she was at home in her living room and provided the address. In the event of technical difficulties, Susan [name] shared a phone number she could be reached at. Susan [name] and this provider participated in today's telepsychological service. Also, Susan [name] denied anyone else being present in the room or on the WebEx appointment. Notably, this provider called Susan [name] at 9:04am as she did not present for the WebEx appointment. She indicated she was unsure how to connect and could not locate the e-mail sent by this provider; therefore, it was re-sent. Susan [name]'s daughter assisted her with connecting. As such, the appointment was initiated 8 minutes late.   This provider conducted a brief check-in and verbally administered the PHQ-9 and GAD-7. Susan [name] stated she continues to attend AA meetings and expressed ongoing worry regarding her sons. Susan [name] shared a decrease in appetite and believes it is secondary to [name]; however, she  discussed making herself eat. In addition, she discussed ongoing stressors with family members and noted, "I'm just fed up." Nevertheless, she added, "I'm really trying to add the principles in my program." The aforementioned was positively reinforced. A brief risk assessment was completed based on Susan [name]'s history. She denied experiencing suicidal and homicidal ideation, plan, and intent since the last appointment with this provider. Susan [name] shared she still has her safety plan and noted, "I think I'm pretty good." Additionally, she shared about her appointment with [name] and noted it went "well" and she maintained her weight. Moreover, psychoeducation regarding emotional versus physical hunger was provided. Susan [name] provided verbal consent during today's appointment for this provider to send the handout for hunger patterns via e-mail to utilize between now and the next appointment to increase awareness of hunger patterns and subsequent eating. Given that Susan [name] has to make herself eat and has difficulty eating all foods on the structured meal plan, this provider recommended she set reminders on her phone; Susan [name] agreed. She was also agreeable to taking snacks with her to [employer] meetings to eat after to avoid going long periods without eating. Furthermore, this provider shared that per Epic a call was placed to [facility] for the referral placed by this provider. She did not receive a call and was receptive to calling Bethel at [employer]. Thus, the number was provided. Susan [name] was receptive to today's session as evidenced by openness to sharing, responsiveness to feedback, and willingness to implement discussed strategies. At the end of the appointment, Susan [name] also showed this provider the list she made of her "homework" from today's appointment and added, "I feel a connection [referring to the rapport being established with this provider]."  Mental Status Examination:  Appearance: neat  Behavior: cooperative Mood: euthymic Affect: mood congruent Speech: normal in rate, volume, and tone Eye Contact: appropriate Psychomotor Activity: appropriate Thought Process: linear, logical, and goal directed  Content/Perceptual Disturbances: denies suicidal and homicidal ideation, plan, and intent and no hallucinations, delusions, bizarre thinking or behavior reported or observed Orientation: time, person, place and purpose of appointment Cognition/Sensorium: memory, attention, language, and fund of knowledge intact  Insight: fair Judgment: fair  Structured Assessment Results: The Patient Health Questionnaire-9 (PHQ-9) is a self-report measure that assesses symptoms and severity of depression over the course of the last two weeks. Susan [name] obtained a score of 11 suggesting moderate depression. Susan [name] finds the endorsed symptoms to be not difficult at all. Decreased interest 1  Down, depressed, hopeless 0  Altered sleeping 0  Tired, decreased energy 2  Change in appetite 3  Feeling bad or failure about yourself 0  Trouble concentrating 3  Moving slowly or fidgety/restless 2  Suicidal thoughts 0  PHQ-9 Score 11    The Generalized Anxiety Disorder-7 (GAD-7) is a brief self-report measure that assesses symptoms of anxiety over the course of the last two weeks. Susan [name] obtained a score of 10 suggesting moderate anxiety. Susan [name] finds the endorsed symptoms to be not difficult at all. Nervous, anxious, on edge 3  Control/stop worrying 1  Worrying too much- different things 2  Trouble relaxing 2  Restless 1  Easily annoyed or irritable 0  Afraid-awful might happen 1  GAD-7 Score 10   Interventions:  Administered PHQ-9 and GAD-7 for symptom monitoring Provided empathic reflections and validation Conducted a risk assessment Engaged patient in problem solving Psychoeducation provided regarding physical versus emotional hunger Provided positive reinforcement Employed supportive  psychotherapy interventions to facilitate reduced distress, and to improve coping skills with identified stressors Conducted a brief chart review  DSM-5 Diagnosis: 311 (F32.8) Other Specified Depressive Disorder, Emotional Eating Behaviors  Treatment Goal & Progress: During the initial appointment with this provider, the following treatment goal was established: decrease emotional eating. Progress is limited, as Berneda has just begun treatment with this provider; however, she is receptive to the interaction and interventions and rapport is being established.   Plan: Susan [name] continues to appear able and willing to participate as evidenced by engagement in reciprocal conversation, and asking questions for clarification as appropriate. The next appointment will be scheduled in two weeks, which will be via [employer]. Once this provider's office resumes in-person appointments, Pamula will be notified. The next session will focus on reviewing hunger patterns and exploring triggers for emotional eating.

## 2018-09-17 ENCOUNTER — Ambulatory Visit (INDEPENDENT_AMBULATORY_CARE_PROVIDER_SITE_OTHER): Payer: Medicare HMO | Admitting: Psychology

## 2018-09-17 ENCOUNTER — Other Ambulatory Visit: Payer: Self-pay

## 2018-09-17 DIAGNOSIS — F3289 Other specified depressive episodes: Secondary | ICD-10-CM | POA: Diagnosis not present

## 2018-09-19 DIAGNOSIS — M65331 Trigger finger, right middle finger: Secondary | ICD-10-CM | POA: Diagnosis not present

## 2018-09-19 DIAGNOSIS — M1812 Unilateral primary osteoarthritis of first carpometacarpal joint, left hand: Secondary | ICD-10-CM | POA: Diagnosis not present

## 2018-09-19 DIAGNOSIS — M65332 Trigger finger, left middle finger: Secondary | ICD-10-CM | POA: Diagnosis not present

## 2018-09-26 ENCOUNTER — Encounter (INDEPENDENT_AMBULATORY_CARE_PROVIDER_SITE_OTHER): Payer: Self-pay

## 2018-09-26 ENCOUNTER — Ambulatory Visit (INDEPENDENT_AMBULATORY_CARE_PROVIDER_SITE_OTHER): Payer: Medicare HMO | Admitting: Family Medicine

## 2018-09-26 ENCOUNTER — Encounter (INDEPENDENT_AMBULATORY_CARE_PROVIDER_SITE_OTHER): Payer: Self-pay | Admitting: Family Medicine

## 2018-09-26 ENCOUNTER — Other Ambulatory Visit: Payer: Self-pay

## 2018-09-27 ENCOUNTER — Other Ambulatory Visit: Payer: Self-pay

## 2018-09-27 ENCOUNTER — Ambulatory Visit (INDEPENDENT_AMBULATORY_CARE_PROVIDER_SITE_OTHER): Payer: Medicare HMO | Admitting: Psychology

## 2018-09-27 DIAGNOSIS — F1421 Cocaine dependence, in remission: Secondary | ICD-10-CM | POA: Diagnosis not present

## 2018-09-27 DIAGNOSIS — F1021 Alcohol dependence, in remission: Secondary | ICD-10-CM

## 2018-09-27 DIAGNOSIS — F330 Major depressive disorder, recurrent, mild: Secondary | ICD-10-CM

## 2018-09-27 NOTE — Progress Notes (Signed)
Office: [phone]  /  Fax: [phone]    Date: Sep 27, 2018    Appointment Start Time: 10:08am Duration: 30 minutes Provider: Glennie Isle, Psy.D. Type of Session: Individual Therapy  Location of Patient: Home Location of Provider: [facility] Type of Contact: Telepsychological Visit via [employer] Web[employer]   Session Content: Susan [name] is a 58 y.o. female presenting via [place] for a follow-up appointment to address the previously established treatment goal of decreasing emotional eating. Notably, this provider called Carmina at 10:02am as she did not present for the [facility] appointment. Susan [name] explained her daughter was attempting to locate the e-mail and connect. As such, the appointment was initiated 8 minutes late. Today's appointment was a telepsychological visit, as this provider's clinic is closed for in-person visits due to COVID-19. Therapeutic services will resume to in-person appointments once the clinic re-opens. Susan [name] expressed understanding regarding the rationale for telepsychological services, and provided verbal consent for today's appointment. Prior to proceeding with today's appointment, Susan [name]'s physical location at the time of this appointment was obtained. Susan [name] reported she was at home and provided the address. In the event of technical difficulties, Susan [name] shared a phone number she could be reached at. Susan [name] and this provider participated in today's telepsychological service. Also, Susan [name] denied anyone else being present in the room or on the WebEx appointment.  This provider conducted a brief check-in and verbally administered the PHQ-9 and GAD-7. Susan [name] reported, "Overall, things have been good." She discussed recently spending time with her grandchildren. Susan [name] also described being "upset" as she was unable to meet with Susan [name] yesterday due to technical issues, as she wanted to share her recent weight loss. As such, with Leonor's verbal consent,  this provider will share the weight loss with Susan [name] today. A risk assessment was completed. Susan [name] denied experiencing suicidal ideation, plan, and intent since the last appointment with this provider. When homicidal ideation was assessed, Susan [name] initially noted, "I'd like to choke the crap out of people when they're being stupid," including her daughter when she does not listen. Susan [name] added, "It's really just a saying" and denied plan and intent. She further added, "I don't mean it seriously" and she again denied homicidal ideation, plan, and intent. Regarding the referral placed by this provider for longer-term therapeutic services, Susan [name] shared she is scheduled for an appointment on Oct 04, 2018. Thus, termination planning was discussed. She expressed desire to continue with this provider until rapport is established with her new provider.   Regarding eating, Susan [name] stated, "It's going pretty good, but it's a lot of food to get in." She indicated she cooks vegetables for the week and cooks meat daily. Susan [name] stated she did not set reminders on her phone, as she was unable to do so because she "has no clue" what she is doing. Susan [name] stated she would ask a friend at her [employer] meeting to help her set the alarms. Since the last appointment with this provider, Susan [name] denied episodes of emotional eating and explained, "I was very aware of what you told me about is it physical hunger or emotional."  Moreover, Susan [name] shared, "When I wear the mask, I feel like I'm going to have a panic attack." She also discussed experiencing anxiety. As such, this provider introduced Susan [name] to a grounding technique involving her senses. She was led through the exercise and Susan [name] provided verbal consent during today's appointment for this provider to send a handout with the exercise via e-mail. She was encouraged  to engage in the grounding technique when experiencing symptoms of anxiety and when she wears a mask; Susan [name] agreed.  Overall, Susan [name] was receptive to today's session as evidenced by openness to sharing, responsiveness to feedback, and willingness to engage in the grounding exercise.  Mental Status Examination:  Appearance: neat Behavior: cooperative Mood: euthymic Affect: mood congruent Speech: normal in rate, volume, and tone Eye Contact: appropriate Psychomotor Activity: appropriate Thought Process: linear, logical, and goal directed  Content/Perceptual Disturbances: denies suicidal and homicidal ideation, plan, and intent and no hallucinations, delusions, bizarre thinking or behavior reported or observed Orientation: time, person, place and purpose of appointment Cognition/Sensorium: memory, attention, language, and fund of knowledge intact  Insight: good Judgment: good  Structured Assessment Results: The Patient Health Questionnaire-9 (PHQ-9) is a self-report measure that assesses symptoms and severity of depression over the course of the last two weeks. Susan [name] obtained a score of 4 suggesting minimal depression. Susan [name] finds the endorsed symptoms to be not difficult at all. Decreased interest 1  Down, depressed, hopeless 0  Altered sleeping 0  Tired, decreased energy 1  Change in appetite 0  Feeling bad or failure about yourself 0  Trouble concentrating 1  Moving slowly or fidgety/restless 1  Suicidal thoughts 0  PHQ-9 Score 4    The Generalized Anxiety Disorder-7 (GAD-7) is a brief self-report measure that assesses symptoms of anxiety over the course of the last two weeks. Susan [name] obtained a score of 3 suggesting minimal anxiety. Susan [name] finds the endorsed symptoms to be not difficult at all. Nervous, anxious, on edge 1  Control/stop worrying 0  Worrying too much- different things 1  Trouble relaxing 0  Restless 1  Easily annoyed or irritable 0  Afraid-awful might happen 0  GAD-7 Score 3   Interventions:  Conducted a brief chart review Verbal administration of PHQ-9 and GAD-7 for  symptom monitoring Provided empathic reflections and validation Reviewed content from the previous session Conducted a risk assessment Discussed termination planning Employed supportive psychotherapy interventions to facilitate reduced distress, and to improve coping skills with identified stressors Psychoeducation provided regarding grounding Engaged patient in a grounding exercise  DSM-5 Diagnoses:  296.31 (F33.0) Major Depressive Disorder, Recurrent Episode, Mild, 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 Susan [name] has demonstrated progress in her goal as evidenced by increased awareness of hunger patterns and reduction of emotional eating episodes. She continues to demonstrate willingness to engage in learned skills.   Plan: Cabrina continues to appear able and willing to participate as evidenced by engagement in reciprocal conversation, and asking questions for clarification as appropriate. The next appointment will be scheduled in two weeks, which will be via [employer]. Once this provider's office resumes in-person appointments, Maleigha will be notified. The next session will focus on discussing triggers for emotional eating, as they were not discussed today due to [facility] presenting concerns.

## 2018-09-29 ENCOUNTER — Other Ambulatory Visit (INDEPENDENT_AMBULATORY_CARE_PROVIDER_SITE_OTHER): Payer: Self-pay | Admitting: Family Medicine

## 2018-09-29 DIAGNOSIS — E559 Vitamin D deficiency, unspecified: Secondary | ICD-10-CM

## 2018-10-02 ENCOUNTER — Ambulatory Visit (INDEPENDENT_AMBULATORY_CARE_PROVIDER_SITE_OTHER): Payer: Medicare HMO | Admitting: Family Medicine

## 2018-10-02 ENCOUNTER — Encounter (INDEPENDENT_AMBULATORY_CARE_PROVIDER_SITE_OTHER): Payer: Self-pay | Admitting: Family Medicine

## 2018-10-02 ENCOUNTER — Other Ambulatory Visit: Payer: Self-pay

## 2018-10-02 DIAGNOSIS — F3289 Other specified depressive episodes: Secondary | ICD-10-CM

## 2018-10-02 DIAGNOSIS — Z6841 Body Mass Index (BMI) 40.0 and over, adult: Secondary | ICD-10-CM

## 2018-10-02 MED ORDER — BUPROPION HCL ER (SR) 150 MG PO TB12: 150.0000 mg | ORAL_TABLET | Freq: Every day | ORAL | 0 refills | Status: DC

## 2018-10-03 NOTE — Progress Notes (Signed)
Office: [phone]  /  Fax: [phone] TeleHealth Visit:  Susan [name] has verbally consented to this TeleHealth visit today. The patient is located at home, the provider is located at the [employer] [facility] office. The participants in this visit include the listed provider and patient and any and all parties involved. The visit was conducted today via telephone. Susan [name] was unable to use realtime audiovisual technology today and the telehealth visit was conducted via telephone.  HPI:   Chief Complaint: OBESITY Susan [name] is here to discuss her progress with her obesity treatment plan. She is on the Category 2 plan and is following her eating plan approximately 90 % of the time. She states she is doing yard work and taking the stairs for exercise. Grant feels she has continued to lose weight. She has had a lot of stressors with her daughter and her grandkids. She is doing well with meal planning when she is own her own, but this is difficult when she is watching her 4 grandkids. We were unable to weigh the patient today for this TeleHealth visit. She feels as if she has lost weight since her last visit. She has lost 6 lbs since starting treatment with [name].  Depression with emotional eating behaviors [name] is on Cymbalta. She has a previous possible diagnosis of bipolar disorder, but she also has a history of ETOH and drug use, which she is sober now for 2+ years. She is using food for comfort and she notes cravings have gotten out of control. She has been working on behavior modification techniques to help reduce her emotional eating and has been somewhat successful. She shows no sign of suicidal or homicidal ideations.  Depression screen [facility] 2/9 09/06/2018 07/26/2018 07/23/2018 07/19/2018 05/24/2018  Decreased Interest 1 2 1 1  0  Down, Depressed, Hopeless 1 2 2 2  0  PHQ - 2 Score 2 4 3 3  0  Altered sleeping 0 3 0 0 0  Tired, decreased energy 1 3 3 3  0  Change in appetite 1 1 0 0 0   Feeling bad or failure about yourself  1 2 0 0 0  Trouble concentrating 2 3 3 3  0  Moving slowly or fidgety/restless 0 2 1 1  0  Suicidal thoughts 0 1 0 0 0  PHQ-9 Score 7 19 10 10  0  Difficult doing work/chores - Somewhat difficult Somewhat difficult Somewhat difficult Not difficult at all  Some recent data might be hidden    ASSESSMENT AND PLAN:  Other depression - with emotional eating - Plan: buPROPion (WELLBUTRIN SR) 150 MG 12 hr tablet  Class 3 severe obesity with serious comorbidity and body mass index (BMI) of 40.0 to 44.9 in adult, unspecified obesity type (HCC)  PLAN:  Depression with Emotional Eating Behaviors We discussed behavior modification techniques today to help Susan [name] deal with her emotional eating and depression. She has agreed to start Wellbutrin SR 150 mg qAM #30 with no refills, and continue Cymbalta as prescribed. We will continue to monitor closely as she is at risk of crossing into mania, if she really has bipolar disorder. Chenay agreed to follow up as directed.  I spent > than 50% of the 25 minute visit on counseling as documented in the note.  Obesity Susan [name] is currently in the action stage of change. As such, her goal is to continue with weight loss efforts She has agreed to follow the Category 2 plan Susan [name] has been instructed to work up to a goal  of 150 minutes of combined cardio and strengthening exercise per week for weight loss and overall health benefits. We discussed the following Behavioral Modification Strategies today: work on meal planning and easy cooking plans, dealing with family or coworker sabotage and emotional eating strategies  Susan [name] has agreed to follow up with our clinic in 2 weeks. She was informed of the importance of frequent follow up visits to maximize her success with intensive lifestyle modifications for her multiple health conditions.  ALLERGIES: Allergies  Allergen Reactions  . Hydrochlorothiazide Other (See Comments)     Electrolyte imbalance  . Lasix [Furosemide]     Electrolyte imbalance    MEDICATIONS: Current Outpatient Medications on File Prior to Visit  Medication Sig Dispense Refill  . albuterol (PROVENTIL HFA;VENTOLIN HFA) 108 (90 Base) MCG/ACT inhaler INHALE 1 TO 2 PUFFS EVERY 4 HOURS AS NEEDED FOR COUGH, WHEEZING, AND SHORTNESS OF BREATH 18 g 2  . amLODipine (NORVASC) 5 MG tablet TAKE 1 TABLET (5 MG TOTAL) BY MOUTH DAILY. 90 tablet 1  . atenolol (TENORMIN) 25 MG tablet TAKE 1 TABLET EVERY DAY 90 tablet 1  . atorvastatin (LIPITOR) 10 MG tablet TAKE 1 TABLET (10 MG TOTAL) BY MOUTH AT BEDTIME. 90 tablet 1  . cholecalciferol (VITAMIN D3) 25 MCG (1000 UT) tablet Take 1,000 Units by mouth daily.    . clotrimazole (LOTRIMIN) 1 % cream Apply 1 application topically 2 (two) times daily. (Patient taking differently: Apply 1 application topically as needed. ) 30 g 0  . cyclobenzaprine (FLEXERIL) 5 MG tablet Take 5 mg by mouth at bedtime.     . DULoxetine (CYMBALTA) 60 MG capsule Take 1 capsule (60 mg total) by mouth 2 (two) times daily. 180 capsule 1  . gabapentin (NEURONTIN) 300 MG capsule Take 600 mg by mouth 2 (two) times daily.     Marland Kitchen HYDROcodone-acetaminophen (NORCO/VICODIN) 5-325 MG tablet One-half to one pill by mouth daily PRN ([facility])    . loratadine (CLARITIN) 10 MG tablet Take 1 tablet (10 mg total) by mouth daily as needed for allergies. 90 tablet 3  . losartan (COZAAR) 100 MG tablet TAKE 1 TABLET EVERY DAY 90 tablet 1  . tiotropium (SPIRIVA HANDIHALER) 18 MCG inhalation capsule INHALE THE CONTENTS OF 1 CAPSULE ONE TIME DAILY 90 capsule 1  . vitamin B-12 (CYANOCOBALAMIN) 1000 MCG tablet Take 1,000 mcg by mouth daily.    . Vitamin D, Ergocalciferol, (DRISDOL) 1.25 MG (50000 UT) CAPS capsule Take 1 capsule (50,000 Units total) by mouth every 7 (seven) days. 4 capsule 0   No current facility-administered medications on file prior to visit.     PAST MEDICAL HISTORY: Past Medical History:  Diagnosis  Date  . ADD (attention deficit disorder)   . Alcohol abuse   . Anemia   . Anxiety   . Aortic atherosclerosis ([place]) 02/20/2018   Chest CT Sept 2019  . Arthritis    rheumatoid arthritis  . Asthma   . Bipolar disorder ([place])   . Centrilobular emphysema ([place]) 02/20/2018   Chest CT Sept 2019  . Constipation   . Degenerative disc disease at L5-S1 level 09/28/2016   See ortho note May 2018  . Depression    bipolar, hx of suicide attempt  . Drug use   . Dyspnea    with exertion  . GERD (gastroesophageal reflux disease)   . H/O suicide attempt    slit wrists  . Hip pain   . History of alcohol abuse   . History of diverticulitis  2013  . History of hepatitis C    HEP "C"--three years ago  . History of MRSA infection 2013  . HLD (hyperlipidemia)   . Hypertension   . Hypothyroidism   . Knee pain   . 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 Multinodular thyroid   . OSA (obstructive sleep apnea)    managed by Dr. Manuella Ghazi  . Osteoporosis   . Post-traumatic osteoarthritis of right knee 09/24/2015  . Prediabetes   . Sleep apnea     Dx 3 years ago. Use C-PAP  . Thyroid disease    Goiter  . Vitamin B12 deficiency   . Vitamin D deficiency disease     PAST SURGICAL HISTORY: Past Surgical History:  Procedure Laterality Date  . BILATERAL SALPINGOOPHORECTOMY     due to abnormal mass  . BREAST BIOPSY Left    neg  . BREAST SURGERY Left 20 yrs ago  . CESAREAN SECTION    . COLONOSCOPY    . COLONOSCOPY WITH PROPOFOL N/A 10/16/2017   Procedure: COLONOSCOPY WITH PROPOFOL;  Surgeon: Jonathon Bellows, MD;  Location: [facility] ENDOSCOPY;  Service: Gastroenterology;  Laterality: N/A;  . HERNIA REPAIR  06/2011, July 2014   Ventral wall repair with Physiomesh  . TONSILLECTOMY    . TOTAL KNEE ARTHROPLASTY Right 05/10/2016   Procedure: TOTAL KNEE ARTHROPLASTY;  Surgeon: Corky Mull, MD;  Location: [facility] ORS;  Service: Orthopedics;  Laterality: Right;  . TUBAL LIGATION      SOCIAL HISTORY: Social History   Tobacco Use   . Smoking status: Current Every Day Smoker    Packs/day: 0.50    Years: 41.00    Pack years: 20.50    Types: Cigarettes  . Smokeless tobacco: Never Used  Substance Use Topics  . Alcohol use: No    Alcohol/week: 0.0 standard drinks    Comment: sober since October 2018  . 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 Alcohol abuse Father   . Heart attack Father   . Arthritis Mother   . Asthma Mother   . Mental illness Mother   . Thyroid disease Mother   . COPD Mother   . Heart disease Mother   . Congestive Heart Failure Mother   . Alcohol abuse Mother   . Eating disorder Mother   . Arthritis Brother   . Mental illness Brother   . Cancer Brother        non-hodkins lymphoma  . Alcohol abuse Sister   . Drug abuse Sister   . Mental illness Sister   . Mental illness Sister   . Fibromyalgia Sister   . Obesity Sister   . Pneumonia Sister   . Mental illness Sister   . Alcohol abuse Sister   . Drug abuse Sister   . Diabetes Neg Hx   . Stroke Neg Hx   . Breast cancer Neg Hx     ROS: Review of Systems  Constitutional: Positive for weight loss.  Endo/Heme/Allergies:       Positive for cravings  Psychiatric/Behavioral: Positive for depression. Negative for suicidal ideas.    PHYSICAL EXAM: Pt in no acute distress  RECENT LABS AND TESTS: BMET    Component Value Date/Time   NA 139 07/26/2018 1101   NA 136 04/29/2014 1133   K 4.7 07/26/2018 1101   K 4.2 04/29/2014 1133   CL 100 07/26/2018 1101  CL 103 04/29/2014 1133   CO2 23 07/26/2018 1101   CO2 30 04/29/2014 1133   GLUCOSE 96 07/26/2018 1101   GLUCOSE 80 08/16/2017 0840   GLUCOSE 85 04/29/2014 1133   BUN 13 07/26/2018 1101   BUN 9 04/29/2014 1133   CREATININE 0.97 07/26/2018 1101   CREATININE 0.79 08/16/2017 0840   CALCIUM 9.5 07/26/2018 1101   CALCIUM 9.1 04/29/2014 1133   GFRNONAA 65 07/26/2018 1101   GFRNONAA  84 08/16/2017 0840   GFRAA 75 07/26/2018 1101   GFRAA 97 08/16/2017 0840   Lab Results  Component Value Date   HGBA1C 5.9 (H) 07/26/2018   HGBA1C 5.2 08/16/2017   HGBA1C 5.6 10/14/2016   HGBA1C 5.3 04/12/2016   HGBA1C 6.1 (H) 10/09/2015   Lab Results  Component Value Date   INSULIN 31.0 (H) 07/26/2018   CBC    Component Value Date/Time   WBC 11.7 (H) 07/26/2018 1101   WBC 10.0 08/16/2017 0840   RBC 4.79 07/26/2018 1101   RBC 4.48 08/16/2017 0840   HGB 15.2 07/26/2018 1101   HCT 43.4 07/26/2018 1101   PLT 221 08/16/2017 0840   PLT 241 10/09/2015 0933   MCV 91 07/26/2018 1101   MCV 91 04/29/2014 1133   MCH 31.7 07/26/2018 1101   MCH 31.7 08/16/2017 0840   MCHC 35.0 07/26/2018 1101   MCHC 35.1 08/16/2017 0840   RDW 11.7 07/26/2018 1101   RDW 13.1 04/29/2014 1133   LYMPHSABS 5.1 (H) 07/26/2018 1101   LYMPHSABS 2.3 11/20/2012 1005   MONOABS 390 10/14/2016 0903   MONOABS 0.5 11/20/2012 1005   EOSABS 0.4 07/26/2018 1101   EOSABS 0.2 11/20/2012 1005   BASOSABS 0.1 07/26/2018 1101   BASOSABS 0.1 11/20/2012 1005   Iron/TIBC/Ferritin/ %Sat No results found for: IRON, TIBC, FERRITIN, IRONPCTSAT Lipid Panel     Component Value Date/Time   CHOL 158 07/26/2018 1101   TRIG 134 07/26/2018 1101   HDL 44 07/26/2018 1101   CHOLHDL 3.5 03/06/2018 1144   CHOLHDL 3.5 08/16/2017 0840   VLDL 27 10/14/2016 0903   LDLCALC 87 07/26/2018 1101   LDLCALC 74 08/16/2017 0840   Hepatic Function Panel     Component Value Date/Time   PROT 7.0 07/26/2018 1101   PROT 7.1 03/13/2013 1509   ALBUMIN 4.7 07/26/2018 1101   ALBUMIN 3.5 03/13/2013 1509   AST 19 07/26/2018 1101   AST 90 (H) 03/13/2013 1509   ALT 19 07/26/2018 1101   ALT 96 (H) 03/13/2013 1509   ALKPHOS 80 07/26/2018 1101   ALKPHOS 110 03/13/2013 1509   BILITOT 0.4 07/26/2018 1101   BILITOT 0.6 03/13/2013 1509      Component Value Date/Time   TSH 1.430 07/26/2018 1101   TSH 0.85 08/16/2017 0840   TSH 0.94 10/14/2016  0903     Ref. Range 07/26/2018 11:01  Vitamin D, 25-Hydroxy Latest Ref Range: 30.0 - 100.0 ng/mL 18.9 (L)    I, Doreene Nest, am acting as Location manager for Dennard Nip, MD I have reviewed the above documentation for accuracy and completeness, and I agree with the above. -Dennard Nip, MD

## 2018-10-04 ENCOUNTER — Ambulatory Visit: Payer: Medicare HMO | Admitting: Psychology

## 2018-10-08 ENCOUNTER — Other Ambulatory Visit: Payer: Self-pay | Admitting: Family Medicine

## 2018-10-09 NOTE — Progress Notes (Signed)
Office: [phone]  /  Fax: [phone]    Date: October 10, 2018  Appointment Start Time: 8:09am Duration: 28 minutes Provider: Glennie Isle, Psy.D. Type of Session: Individual Therapy  Location of Patient: Home Location of Provider: Provider's Home Type of Contact: Telepsychological Visit via [employer] Web[employer]   Session Content: Susan [name] is a 58 y.o. female presenting via [employer] for a follow-up appointment to address the previously established treatment goal of decreasing emotional eating. Of note, this provider called [facility] at 8:03am as she did not join the [employer] appointment. She explained she was trying to join on her own as her daughter was unavailable; therefore, this provider assisted Susan [name]. Thus, today's appointment was initiated 9 minutes late. Today's appointment was a telepsychological visit, as this provider's clinic is seeing a limited number of patients for in-person visits due to COVID-19. Therapeutic services will resume to in-person appointments once deemed appropriate. Zariah expressed understanding regarding the rationale for telepsychological services, and provided verbal consent for today's appointment. Prior to proceeding with today's appointment, Susan [name]'s physical location at the time of this appointment was obtained. [name] reported she was at home and provided the address. In the event of technical difficulties, [name] shared a phone number she could be reached at. [name] and this provider participated in today's telepsychological service. Also, [name] denied anyone else being present in the room or on the WebEx appointment.  This provider conducted a brief check-in and verbally administered the PHQ-9 and GAD-7. Susan [name] indicated she recently had an appointment with [name] and she was prescribed Wellbutrin. She noted, "It's been making me feel weird." This was explored. She noted feeling "hyper but tired at the same time." It was recommended she follow-up with Dr.  Leafy Ro; Susan [name] agreed. Additionally, [name] stated she started her carpentry class. Moreover, she discussed "being at odds" with her daughter; however, [name] discussed she is staying out of it. A risk assessment was completed. Susan [name] denied experiencing suicidal and homicidal ideation, plan, and intent since the last appointment with this provider. She indicated she still has her safety plan and noted, "I have my friends at [name] available to me." She further discussed using learned tools to assist with coping.   When experiencing anxiety, Susan [name] discussed engaging in the grounding exercise. She also shared she is focusing on whether she is experiencing physical or emotional hunger when she has the urge to eat. Since the last appointment with this provider, Susan [name] denied episodes of emotional eating. Psychoeducation regarding emotional hunger versus emotional eating was provided. Recently, Susan [name] further shared that she is wondering if her eating habits are secondary to how "strict" her mother was when she was growing up. As such, today's appointment focused further on eating habits, specifically triggers for emotional eating. Psychoeducation regarding triggers for emotional eating was provided. Susan [name] was provided a handout via e-mail, and encouraged to utilize the handout between now and the next appointment to increase awareness of triggers and frequency. Madeleyn agreed. This provider also discussed behavioral strategies for specific triggers, such as placing the utensil down when conversing to avoid mindless eating. Furthermore, this provider discussed completing an authorization for coordination of care with [facility]. Notably, Susan [name] shared she rescheduled her appointment per the referral to October 30, 2018 at 3:00pm. [place] provided verbal consent during today's appointment for this provider to send the authorization form and the handout for triggers via e-mail. Overall, Susan [name] was receptive to  today's session as evidenced by openness to sharing, responsiveness to feedback, and  willingness to identify triggers for emotional eating.  Mental Status Examination:  Appearance: neat Behavior: cooperative Mood: euthymic Affect: mood congruent Speech: normal in rate, volume, and tone Eye Contact: appropriate Psychomotor Activity: appropriate Thought Process: linear, logical, and goal directed  Content/Perceptual Disturbances: denies suicidal and homicidal ideation, plan, and intent and no hallucinations, delusions, bizarre thinking or behavior reported or observed Orientation: time, person, place and purpose of appointment Cognition/Sensorium: memory, attention, language, and fund of knowledge intact  Insight: good Judgment: good  Structured Assessment Results: The Patient Health Questionnaire-9 (PHQ-9) is a self-report measure that assesses symptoms and severity of depression over the course of the last two weeks. Susan [name] obtained a score of 7 suggesting mild depression. Susan [name] finds the endorsed symptoms to be not difficult at all. Decreased interest 1  Down, depressed, hopeless 1  Altered sleeping 0  Tired, decreased energy 3  Change in appetite 0  Feeling bad or failure about yourself 0  Trouble concentrating 1  Moving slowly or fidgety/restless 1  Suicidal thoughts 0  PHQ-9 Score 7    The Generalized Anxiety Disorder-7 (GAD-7) is a brief self-report measure that assesses symptoms of anxiety over the course of the last two weeks. Susan [name] obtained a score of 4 suggesting minimal anxiety. Susan [name] finds the endorsed symptoms to be not difficult at all. Nervous, anxious, on edge 1  Control/stop worrying 1  Worrying too much- different things 1  Trouble relaxing 1  Restless 0  Easily annoyed or irritable 0  Afraid-awful might happen 1  GAD-7 Score 4   Interventions:  Conducted a brief chart review Verbal administration of PHQ-9 and GAD-7 for symptom monitoring Provided  empathic reflections and validation Reviewed content from the previous session Conducted a risk assessment Psychoeducation provided regarding triggers for emotional eating Provided positive reinforcement Employed supportive psychotherapy interventions to facilitate reduced distress, and to improve coping skills with identified stressors Psychoeducation provided regarding emotional hunger versus emotional eating  DSM-5 Diagnoses:  296.31 (F33.0) Major Depressive Disorder, Recurrent Episode, Mild, 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 Taneika has demonstrated progress in her goal as evidenced by increased awareness of hunger patterns and willingness to identify triggers for emotional eating. She also denied episodes of emotional eating since the last appointment with this provider and continues to demonstrate willingness to engage in learned skills.   Plan: Marciana continues to appear able and willing to participate as evidenced by engagement in reciprocal conversation, and asking questions for clarification as appropriate. The next appointment will be scheduled in two weeks, which will be via [employer]. Once this provider's office resumes in-person appointments and it is deemed appropriate, Jajaira will be notified. The next session will focus on reviewing triggers for emotional eating and the introduction of thought defusion.

## 2018-10-10 ENCOUNTER — Ambulatory Visit (INDEPENDENT_AMBULATORY_CARE_PROVIDER_SITE_OTHER): Payer: Medicare HMO | Admitting: Psychology

## 2018-10-10 ENCOUNTER — Ambulatory Visit (INDEPENDENT_AMBULATORY_CARE_PROVIDER_SITE_OTHER): Payer: Medicare HMO | Admitting: Family Medicine

## 2018-10-10 ENCOUNTER — Other Ambulatory Visit: Payer: Self-pay | Admitting: Family Medicine

## 2018-10-10 ENCOUNTER — Other Ambulatory Visit: Payer: Self-pay

## 2018-10-10 DIAGNOSIS — I10 Essential (primary) hypertension: Secondary | ICD-10-CM

## 2018-10-10 DIAGNOSIS — F330 Major depressive disorder, recurrent, mild: Secondary | ICD-10-CM

## 2018-10-10 DIAGNOSIS — F1021 Alcohol dependence, in remission: Secondary | ICD-10-CM

## 2018-10-10 DIAGNOSIS — F1421 Cocaine dependence, in remission: Secondary | ICD-10-CM | POA: Diagnosis not present

## 2018-10-11 ENCOUNTER — Ambulatory Visit (INDEPENDENT_AMBULATORY_CARE_PROVIDER_SITE_OTHER): Payer: Self-pay | Admitting: Psychology

## 2018-10-15 ENCOUNTER — Encounter (INDEPENDENT_AMBULATORY_CARE_PROVIDER_SITE_OTHER): Payer: Self-pay | Admitting: Family Medicine

## 2018-10-15 ENCOUNTER — Other Ambulatory Visit: Payer: Self-pay

## 2018-10-15 ENCOUNTER — Ambulatory Visit (INDEPENDENT_AMBULATORY_CARE_PROVIDER_SITE_OTHER): Payer: Medicare HMO | Admitting: Family Medicine

## 2018-10-15 DIAGNOSIS — Z6841 Body Mass Index (BMI) 40.0 and over, adult: Secondary | ICD-10-CM

## 2018-10-15 DIAGNOSIS — F3289 Other specified depressive episodes: Secondary | ICD-10-CM | POA: Diagnosis not present

## 2018-10-15 DIAGNOSIS — I10 Essential (primary) hypertension: Secondary | ICD-10-CM

## 2018-10-15 DIAGNOSIS — E559 Vitamin D deficiency, unspecified: Secondary | ICD-10-CM | POA: Diagnosis not present

## 2018-10-16 MED ORDER — VITAMIN D (ERGOCALCIFEROL) 1.25 MG (50000 UNIT) PO CAPS: 50000.0000 [IU] | ORAL_CAPSULE | ORAL | 0 refills | Status: DC

## 2018-10-16 NOTE — Progress Notes (Signed)
Office: [phone]  /  Fax: [phone] TeleHealth Visit:  Susan [name] has verbally consented to this TeleHealth visit today. The patient is located at home, the provider is located at the [employer] [facility] office. The participants in this visit include the listed provider and patient and any and all parties involved. The visit was conducted today via telephone. Susan [name] was unable to use realtime audiovisual technology today (FaceTime failed) and the telehealth visit was conducted via telephone.  HPI:   Chief Complaint: OBESITY Susan [name] is here to discuss her progress with her obesity treatment plan. She is on the Category 2 plan and is following her eating plan approximately 50 % of the time. She states she is doing yard work everyday for exercise. Susan [name] has had her grand-kids and she has gotten off track with her eating plan. Susan [name] is up approximately 3 pounds, but she is ready to get back to her plan more closely. She finds the meal planning to be especially challenging. We were unable to weigh the patient today for this TeleHealth visit. She feels as if she has gained weight since her last visit. She has lost approximately 6 lbs since starting treatment with [name].  Vitamin D deficiency Susan [name] has a diagnosis of vitamin D deficiency. Susan [name] is stable on vit D and she denies nausea, vomiting or muscle weakness.  Hypertension Susan [name] is a 58 y.o. female with hypertension. Her blood pressure, taken by patient at home today, was at 150/83. She fought with her daughter earlier today and she feels this is why her blood pressure has increased.  Susan [name] denies chest pain, headache or dizziness. Susan [name] is on Norvasc, Atenolol and Losartan. She is working weight loss to help control her blood pressure with the goal of decreasing her risk of heart attack and stroke. Susan [name] blood pressure is not currently controlled.  Depression with emotional eating behaviors Susan [name]  struggles with emotional eating and using food for comfort to the extent that it is negatively impacting her health. She often snacks when she is not hungry. Gissell sometimes feels she is out of control and then feels guilty that she made poor food choices. She has been working on behavior modification techniques to help reduce her emotional eating and has been somewhat successful. Susan [name] was prescribed Wellbutrin at her last visit, in addition to her Cymbalta. She shows no sign of suicidal or homicidal ideations.  ASSESSMENT AND PLAN:  Vitamin D deficiency - Plan: Vitamin D, Ergocalciferol, (DRISDOL) 1.25 MG (50000 UT) CAPS capsule  Essential hypertension  Other depression - with emotional eating  Class 3 severe obesity with serious comorbidity and body mass index (BMI) of 40.0 to 44.9 in adult, unspecified obesity type ([place])  PLAN:  Vitamin D Deficiency Susan [name] was informed that low vitamin D levels contributes to fatigue and are associated with obesity, breast, and colon cancer. She agrees to continue to take prescription Vit D @50 ,000 IU every week #4 with no refills and will follow up for routine testing of vitamin D, at least 2-3 times per year. She was informed of the risk of over-replacement of vitamin D and agrees to not increase her dose unless she discusses this with [name] first. Susan [name] agrees to follow up as directed.  Hypertension We discussed sodium restriction, working on healthy weight loss, and a regular exercise program as the means to achieve improved blood pressure control. Susan [name] agreed with this plan and agreed to follow up as directed. Susan [name] will check  her blood pressure at home daily and we will review in 2 to 3 weeks. We will continue to monitor her blood pressure as well as her progress with the above lifestyle modifications. She will continue her medications as prescribed and will watch for signs of hypotension as she continues her lifestyle modifications.  Depression with  Emotional Eating Behaviors We discussed behavior modification techniques today to help Susan [name] deal with her emotional eating and depression. She will continue her medications and follow up as directed.  Obesity Susan [name] is currently in the action stage of change. As such, her goal is to continue with weight loss efforts She has agreed to follow the Category 2 plan Susan [name] has been instructed to work up to a goal of 150 minutes of combined cardio and strengthening exercise per week for weight loss and overall health benefits. We discussed the following Behavioral Modification Strategies today: decreasing sodium intake and work on meal planning and easy cooking plans  Shrika has agreed to follow up with our clinic in 2 to 3 weeks. She was informed of the importance of frequent follow up visits to maximize her success with intensive lifestyle modifications for her multiple health conditions.  I spent > than 50% of the 25 minute visit on counseling as documented in the note.    ALLERGIES: Allergies  Allergen Reactions  . Hydrochlorothiazide Other (See Comments)    Electrolyte imbalance  . Lasix [Furosemide]     Electrolyte imbalance    MEDICATIONS: Current Outpatient Medications on File Prior to Visit  Medication Sig Dispense Refill  . albuterol (PROVENTIL HFA;VENTOLIN HFA) 108 (90 Base) MCG/ACT inhaler INHALE 1 TO 2 PUFFS EVERY 4 HOURS AS NEEDED FOR COUGH, WHEEZING, AND SHORTNESS OF BREATH 18 g 2  . amLODipine (NORVASC) 5 MG tablet TAKE 1 TABLET (5 MG TOTAL) BY MOUTH DAILY. 90 tablet 1  . atenolol (TENORMIN) 25 MG tablet TAKE 1 TABLET EVERY DAY 90 tablet 1  . atorvastatin (LIPITOR) 10 MG tablet TAKE 1 TABLET (10 MG TOTAL) BY MOUTH AT BEDTIME. 90 tablet 1  . buPROPion (WELLBUTRIN SR) 150 MG 12 hr tablet Take 1 tablet (150 mg total) by mouth daily. 30 tablet 0  . cholecalciferol (VITAMIN D3) 25 MCG (1000 UT) tablet Take 1,000 Units by mouth daily.    . clotrimazole (LOTRIMIN) 1 % cream  Apply 1 application topically 2 (two) times daily. (Patient taking differently: Apply 1 application topically as needed. ) 30 g 0  . cyclobenzaprine (FLEXERIL) 5 MG tablet Take 5 mg by mouth at bedtime.     . DULoxetine (CYMBALTA) 60 MG capsule TAKE 1 CAPSULE (60 MG TOTAL) BY MOUTH 2 (TWO) TIMES DAILY. 180 capsule 1  . gabapentin (NEURONTIN) 300 MG capsule Take 600 mg by mouth 2 (two) times daily.     Marland Kitchen HYDROcodone-acetaminophen (NORCO/VICODIN) 5-325 MG tablet One-half to one pill by mouth daily PRN ([facility])    . loratadine (CLARITIN) 10 MG tablet Take 1 tablet (10 mg total) by mouth daily as needed for allergies. 90 tablet 3  . losartan (COZAAR) 100 MG tablet TAKE 1 TABLET EVERY DAY 90 tablet 1  . tiotropium (SPIRIVA HANDIHALER) 18 MCG inhalation capsule INHALE THE CONTENTS OF 1 CAPSULE ONE TIME DAILY 90 capsule 1  . vitamin B-12 (CYANOCOBALAMIN) 1000 MCG tablet Take 1,000 mcg by mouth daily.     No current facility-administered medications on file prior to visit.     PAST MEDICAL HISTORY: Past Medical History:  Diagnosis Date  . ADD (  attention deficit disorder)   . Alcohol abuse   . Anemia   . Anxiety   . Aortic atherosclerosis ([place]) 02/20/2018   Chest CT Sept 2019  . Arthritis    rheumatoid arthritis  . Asthma   . Bipolar disorder ([place])   . Centrilobular emphysema ([place]) 02/20/2018   Chest CT Sept 2019  . Constipation   . Degenerative disc disease at L5-S1 level 09/28/2016   See ortho note May 2018  . Depression    bipolar, hx of suicide attempt  . Drug use   . Dyspnea    with exertion  . GERD (gastroesophageal reflux disease)   . H/O suicide attempt    slit wrists  . Hip pain   . History of alcohol abuse   . History of diverticulitis 2013  . History of hepatitis C    HEP "C"--three years ago  . History of MRSA infection 2013  . HLD (hyperlipidemia)   . Hypertension   . Hypothyroidism   . Knee pain   . 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 Multinodular thyroid   . OSA  (obstructive sleep apnea)    managed by Dr. Manuella Ghazi  . Osteoporosis   . Post-traumatic osteoarthritis of right knee 09/24/2015  . Prediabetes   . Sleep apnea     Dx 3 years ago. Use C-PAP  . Thyroid disease    Goiter  . Vitamin B12 deficiency   . Vitamin D deficiency disease     PAST SURGICAL HISTORY: Past Surgical History:  Procedure Laterality Date  . BILATERAL SALPINGOOPHORECTOMY     due to abnormal mass  . BREAST BIOPSY Left    neg  . BREAST SURGERY Left 20 yrs ago  . CESAREAN SECTION    . COLONOSCOPY    . COLONOSCOPY WITH PROPOFOL N/A 10/16/2017   Procedure: COLONOSCOPY WITH PROPOFOL;  Surgeon: Jonathon Bellows, MD;  Location: [facility] ENDOSCOPY;  Service: Gastroenterology;  Laterality: N/A;  . HERNIA REPAIR  06/2011, July 2014   Ventral wall repair with Physiomesh  . TONSILLECTOMY    . TOTAL KNEE ARTHROPLASTY Right 05/10/2016   Procedure: TOTAL KNEE ARTHROPLASTY;  Surgeon: Corky Mull, MD;  Location: [facility] ORS;  Service: Orthopedics;  Laterality: Right;  . TUBAL LIGATION      SOCIAL HISTORY: Social History   Tobacco Use  . Smoking status: Current Every Day Smoker    Packs/day: 0.50    Years: 41.00    Pack years: 20.50    Types: Cigarettes  . Smokeless tobacco: Never Used  Substance Use Topics  . Alcohol use: No    Alcohol/week: 0.0 standard drinks    Comment: sober since October 2018  . 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 Alcohol abuse Father   . Heart attack Father   . Arthritis Mother   . Asthma Mother   . Mental illness Mother   . Thyroid disease Mother   . COPD Mother   . Heart disease Mother   . Congestive Heart Failure Mother   . Alcohol abuse Mother   . Eating disorder Mother   . Arthritis Brother   . Mental illness Brother   . Cancer Brother        non-hodkins lymphoma  . Alcohol abuse Sister   . Drug abuse Sister   . Mental illness Sister   .  Mental illness Sister   .  Fibromyalgia Sister   . Obesity Sister   . Pneumonia Sister   . Mental illness Sister   . Alcohol abuse Sister   . Drug abuse Sister   . Diabetes Neg Hx   . Stroke Neg Hx   . Breast cancer Neg Hx     ROS: Review of Systems  Constitutional: Negative for weight loss.  Cardiovascular: Negative for chest pain.  Gastrointestinal: Negative for nausea and vomiting.  Musculoskeletal:       Negative for muscle weakness  Neurological: Negative for dizziness and headaches.  Psychiatric/Behavioral: Positive for depression and suicidal ideas.    PHYSICAL EXAM: Pt in no acute distress  RECENT LABS AND TESTS: BMET    Component Value Date/Time   NA 139 07/26/2018 1101   NA 136 04/29/2014 1133   K 4.7 07/26/2018 1101   K 4.2 04/29/2014 1133   CL 100 07/26/2018 1101   CL 103 04/29/2014 1133   CO2 23 07/26/2018 1101   CO2 30 04/29/2014 1133   GLUCOSE 96 07/26/2018 1101   GLUCOSE 80 08/16/2017 0840   GLUCOSE 85 04/29/2014 1133   BUN 13 07/26/2018 1101   BUN 9 04/29/2014 1133   CREATININE 0.97 07/26/2018 1101   CREATININE 0.79 08/16/2017 0840   CALCIUM 9.5 07/26/2018 1101   CALCIUM 9.1 04/29/2014 1133   GFRNONAA 65 07/26/2018 1101   GFRNONAA 84 08/16/2017 0840   GFRAA 75 07/26/2018 1101   GFRAA 97 08/16/2017 0840   Lab Results  Component Value Date   HGBA1C 5.9 (H) 07/26/2018   HGBA1C 5.2 08/16/2017   HGBA1C 5.6 10/14/2016   HGBA1C 5.3 04/12/2016   HGBA1C 6.1 (H) 10/09/2015   Lab Results  Component Value Date   INSULIN 31.0 (H) 07/26/2018   CBC    Component Value Date/Time   WBC 11.7 (H) 07/26/2018 1101   WBC 10.0 08/16/2017 0840   RBC 4.79 07/26/2018 1101   RBC 4.48 08/16/2017 0840   HGB 15.2 07/26/2018 1101   HCT 43.4 07/26/2018 1101   PLT 221 08/16/2017 0840   PLT 241 10/09/2015 0933   MCV 91 07/26/2018 1101   MCV 91 04/29/2014 1133   MCH 31.7 07/26/2018 1101   MCH 31.7 08/16/2017 0840   MCHC 35.0 07/26/2018 1101   MCHC 35.1  08/16/2017 0840   RDW 11.7 07/26/2018 1101   RDW 13.1 04/29/2014 1133   LYMPHSABS 5.1 (H) 07/26/2018 1101   LYMPHSABS 2.3 11/20/2012 1005   MONOABS 390 10/14/2016 0903   MONOABS 0.5 11/20/2012 1005   EOSABS 0.4 07/26/2018 1101   EOSABS 0.2 11/20/2012 1005   BASOSABS 0.1 07/26/2018 1101   BASOSABS 0.1 11/20/2012 1005   Iron/TIBC/Ferritin/ %Sat No results found for: IRON, TIBC, FERRITIN, IRONPCTSAT Lipid Panel     Component Value Date/Time   CHOL 158 07/26/2018 1101   TRIG 134 07/26/2018 1101   HDL 44 07/26/2018 1101   CHOLHDL 3.5 03/06/2018 1144   CHOLHDL 3.5 08/16/2017 0840   VLDL 27 10/14/2016 0903   LDLCALC 87 07/26/2018 1101   LDLCALC 74 08/16/2017 0840   Hepatic Function Panel     Component Value Date/Time   PROT 7.0 07/26/2018 1101   PROT 7.1 03/13/2013 1509   ALBUMIN 4.7 07/26/2018 1101   ALBUMIN 3.5 03/13/2013 1509   AST 19 07/26/2018 1101   AST 90 (H) 03/13/2013 1509   ALT 19 07/26/2018 1101   ALT 96 (H) 03/13/2013 1509   ALKPHOS 80 07/26/2018 1101   ALKPHOS 110 03/13/2013 1509  BILITOT 0.4 07/26/2018 1101   BILITOT 0.6 03/13/2013 1509      Component Value Date/Time   TSH 1.430 07/26/2018 1101   TSH 0.85 08/16/2017 0840   TSH 0.94 10/14/2016 0903     Ref. Range 07/26/2018 11:01  Vitamin D, 25-Hydroxy Latest Ref Range: 30.0 - 100.0 ng/mL 18.9 (L)    I, Doreene Nest, am acting as Location manager for Dennard Nip, MD I have reviewed the above documentation for accuracy and completeness, and I agree with the above. -Dennard Nip, MD

## 2018-10-23 DIAGNOSIS — F329 Major depressive disorder, single episode, unspecified: Secondary | ICD-10-CM | POA: Insufficient documentation

## 2018-10-23 DIAGNOSIS — Z6841 Body Mass Index (BMI) 40.0 and over, adult: Secondary | ICD-10-CM | POA: Insufficient documentation

## 2018-10-23 DIAGNOSIS — F32A Depression, unspecified: Secondary | ICD-10-CM | POA: Insufficient documentation

## 2018-10-24 ENCOUNTER — Ambulatory Visit (INDEPENDENT_AMBULATORY_CARE_PROVIDER_SITE_OTHER): Payer: Medicare HMO | Admitting: Psychology

## 2018-10-24 ENCOUNTER — Other Ambulatory Visit: Payer: Self-pay

## 2018-10-24 DIAGNOSIS — F1021 Alcohol dependence, in remission: Secondary | ICD-10-CM

## 2018-10-24 DIAGNOSIS — F1421 Cocaine dependence, in remission: Secondary | ICD-10-CM

## 2018-10-24 DIAGNOSIS — F330 Major depressive disorder, recurrent, mild: Secondary | ICD-10-CM

## 2018-10-24 NOTE — Progress Notes (Signed)
Office: [phone]  /  Fax: [phone]    Date: October 24, 2018    Appointment Start Time: 8:07am Duration: 25 minutes Provider: Glennie Isle, Psy.D. Type of Session: Individual Therapy  Location of Patient: Home Location of Provider: Provider's Home Type of Contact: Telepsychological Visit via [employer] WebEx   Session Content: Susan [name] is a 58 y.o. female presenting via [place] for a follow-up appointment to address the previously established treatment goal of decreasing emotional eating. Of note, this provider called Susan [name] at 8:02am as she did not present for today's appointment. She shared, "I've been nervous" regarding her ability to connect. Directions were provided and the e-mail with the secure link for today's appointment was re-sent. As such, today's appointment was initiated 7 minutes late. Today's appointment was a telepsychological visit, as this provider's clinic is seeing a limited number of patients for in-person visits due to COVID-19. Therapeutic services will resume to in-person appointments once deemed appropriate. Susan [name] expressed understanding regarding the rationale for telepsychological services, and provided verbal consent for today's appointment. Prior to proceeding with today's appointment, Susan [name] physical location at the time of this appointment was obtained. Susan [name] reported she was at home and provided the address. In the event of technical difficulties, Susan [name] shared a phone number she could be reached at. Susan [name] and this provider participated in today's telepsychological service. Also, Susan [name] denied anyone else being present in the room or on the WebEx appointment.  This provider conducted a brief check-in and verbally administered the PHQ-9 and GAD-7. Susan [name] reported she had a "major falling out" with her daughter; however, her daughter called and apologized on Monday. Susan [name] discussed setting boundaries with her daughter as conflicts have occurred in the past.  Associated thoughts and feelings processed. Additionally, Susan [name] reported the weather "has contributed" to decreased motivation, fatigue, and feeling down. She denied experiencing hopelessness. A risk assessment was completed. On Monday, Susan [name] expressed having thoughts such as, "How would they [referring to her children] feel if I was dead? Would that make you feel better?" She denied experiencing plan and intent. She added, "It was not really the thought of hurting myself. Just disappearing and thinking, then what are you going to do?" This was secondary to the argument with her daughter and the weather. She discussed having thoughts of "selling everything and moving to the beach." She added, "Then they would come here and then be like where's mother?" Susan [name] shared she enjoys the beach and her brother resides there. She noted, "Me and him have some good conversations." She denied current suicidal and homicidal ideation, plan, and intent. Susan [name] acknowledged she has easy access to her safety plan and noted, "It's nothing I haven't gone through before. I know what to do. If I'm just sitting on the floor and not feeding the dog, that's a problem. That's too far into the depression." She discussed what she disclosed was "momentary" and it was about her children. She reported she is feeding her dogs and noted, "I make them special food." Susan [name] also stated she continues to meet with her sponsor and is attending school. Moreover, Susan [name] discussed feeling "very manic" on Saturday. This was explored. Susan [name] reported waking up early, going out with her dogs, and completed yard and house work. Psychoeducation regarding mania was provided, and she acknowledged that she was not experiencing mania at the time.    Susan [name] denied episodes of emotional eating and discussed a plan to discuss further options with Susan [name]. Psychoeducation regarding pleasurable activities, including its impact  on emotional eating and overall  well-being was provided to assist with coping. Susan [name] was provided with a handout via e-mail with various options of pleasurable activities, and was encouraged to engage in one activity a day and additional activities as needed when triggered to emotionally eat. Susan [name] agreed. Susan [name] provided verbal consent during today's appointment for this provider to send the handout via e-mail. Susan [name] was receptive to today's session as evidenced by openness to sharing, responsiveness to feedback, and willingness to engage in pleasurable activities.  Mental Status Examination:  Appearance: neat Behavior: cooperative Mood: anxious Affect: mood congruent Speech: normal in rate, volume, and tone Eye Contact: appropriate Psychomotor Activity: appropriate Thought Process: linear, logical, and goal directed  Content/Perceptual Disturbances: denies suicidal and homicidal ideation, plan, and intent and no hallucinations, delusions, bizarre thinking or behavior reported or observed Orientation: time, person, place and purpose of appointment Cognition/Sensorium: memory, attention, language, and fund of knowledge intact  Insight: good Judgment: good  Structured Assessment Results: The Patient Health Questionnaire-9 (PHQ-9) is a self-report measure that assesses symptoms and severity of depression over the course of the last two weeks. Susan [name] obtained a score of 3 suggesting minimal depression. Susan [name] finds the endorsed symptoms to be not difficult at all. Little interest or pleasure in doing things 0  Feeling down, depressed, or hopeless 1  Trouble falling or staying asleep, or sleeping too much 0  Feeling tired or having little energy 1  Poor appetite or overeating 0  Feeling bad about yourself --- or that you are a failure or have let yourself or your family down 0  Trouble concentrating on things, such as reading the newspaper or watching television 1  Moving or speaking so slowly that other people could have  noticed? Or the opposite --- being so fidgety or restless that you have been moving around a lot more than usual 0  Thoughts that you would be better off dead or hurting yourself in some way 0  PHQ-9 Score 3    The Generalized Anxiety Disorder-7 (GAD-7) is a brief self-report measure that assesses symptoms of anxiety over the course of the last two weeks. Susan [name] obtained a score of 4 suggesting minimal anxiety. Susan [name] finds the endorsed symptoms to be somewhat difficult. Feeling nervous, anxious, on edge 0  Not being able to stop or control worrying 1  Worrying too much about different things 1  Trouble relaxing 1  Being so restless that it's hard to sit still 1  Becoming easily annoyed or irritable 0  Feeling afraid as if something awful might happen 0  GAD-7 Score 4   Interventions:  Conducted a brief chart review Verbal administration of PHQ-9 and GAD-7 for symptom monitoring Provided empathic reflections and validation Conducted a risk assessment Processed thoughts and feelings Psychoeducation provided regarding pleasurable activities Employed supportive psychotherapy interventions to facilitate reduced distress, and to improve coping skills with identified stressors Psychoeducation provided regarding mania  DSM-5 Diagnoses:  296.31 (F33.0) Major Depressive Disorder, Recurrent Episode, Mild, 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 Susan [name] has demonstrated progress in her goal as evidenced by increased awareness of hunger patterns and triggers for emotional eating. She continues to demonstrate willingness to engage in learned skills and denied episodes of emotional eating since the last appointment with this provider.  Plan: I[place]k continues to appear able and willing to participate as evidenced by engagement in reciprocal conversation, and asking questions for clarification as appropriate. The next appointment will be scheduled in two weeks, which will be via [employer]. Once this provider's office resumes in-person appointments and it is deemed appropriate, [place] will be notified. The next session will focus on reviewing triggers for emotional eating, and the introduction of thought defusion, as it was not introduced today due to [facility] presenting concerns.

## 2018-10-29 ENCOUNTER — Encounter (INDEPENDENT_AMBULATORY_CARE_PROVIDER_SITE_OTHER): Payer: Self-pay | Admitting: Family Medicine

## 2018-10-29 ENCOUNTER — Ambulatory Visit (INDEPENDENT_AMBULATORY_CARE_PROVIDER_SITE_OTHER): Payer: Medicare HMO | Admitting: Family Medicine

## 2018-10-29 ENCOUNTER — Other Ambulatory Visit: Payer: Self-pay

## 2018-10-29 DIAGNOSIS — F3289 Other specified depressive episodes: Secondary | ICD-10-CM | POA: Diagnosis not present

## 2018-10-29 DIAGNOSIS — I10 Essential (primary) hypertension: Secondary | ICD-10-CM

## 2018-10-29 DIAGNOSIS — Z6841 Body Mass Index (BMI) 40.0 and over, adult: Secondary | ICD-10-CM

## 2018-10-30 ENCOUNTER — Ambulatory Visit (INDEPENDENT_AMBULATORY_CARE_PROVIDER_SITE_OTHER): Payer: Medicare HMO | Admitting: Psychology

## 2018-10-30 DIAGNOSIS — F3289 Other specified depressive episodes: Secondary | ICD-10-CM

## 2018-10-30 MED ORDER — BUPROPION HCL ER (SR) 200 MG PO TB12: 200.0000 mg | ORAL_TABLET | Freq: Every day | ORAL | 0 refills | Status: DC

## 2018-10-30 NOTE — Progress Notes (Signed)
Office: [phone]  /  Fax: [phone] TeleHealth Visit:  Susan [name] has verbally consented to this TeleHealth visit today. The patient is located at home, the provider is located at the [employer] [facility] office. The participants in this visit include the listed provider and patient. Susan [name] was unable to use realtime audiovisual technology today and the telehealth visit was conducted via telephone.   HPI:   Chief Complaint: OBESITY Susan [name] is here to discuss her progress with her obesity treatment plan. She is on the  follow the Category 2 plan and is following her eating plan approximately 50 % of the time. She states she is exercising 0 minutes 0 times per week. Susan [name] has been maintaining weight well but got off track in the last 2 weeks and has gained approximately 2 lbs. She is ready to get back on track. She is bored with breakfast and would like more options.  We were unable to weigh the patient today for this TeleHealth visit. She feels as if she has gained weight since her last visit. She has lost 6 lbs lbs since starting treatment with [name].  Hypertension Susan [name] is a 58 y.o. female with hypertension.  Susan [name] denies chest pain or shortness of breath on exertion. She is working weight loss to help control her blood pressure with the goal of decreasing her risk of heart attack and stroke. Susan [name] is on atenolol and losartan, she has checked blood pressure 2 times in the last 2 weeks it was 132/68 yesterday but was 161/84 a few days before.   Depression with emotional eating behaviors Susan [name] is struggling with emotional eating and using food for comfort to the extent that it is negatively impacting her health. She often snacks when she is not hungry. Susan [name] sometimes feels she is out of control and then feels guilty that she made poor food choices. She has been working on behavior modification techniques to help reduce her emotional eating and has been  somewhat successful. She shows no sign of suicidal or homicidal ideations. She is on cymbalta and no wellbutrin x 1 month. She is not certain f the wellbutrin is helping her eating but she does have decreased desire to smoke.   Depression screen [facility] 2/9 09/06/2018 07/26/2018 07/23/2018 07/19/2018 05/24/2018  Decreased Interest 1 2 1 1  0  Down, Depressed, Hopeless 1 2 2 2  0  PHQ - 2 Score 2 4 3 3  0  Altered sleeping 0 3 0 0 0  Tired, decreased energy 1 3 3 3  0  Change in appetite 1 1 0 0 0  Feeling bad or failure about yourself  1 2 0 0 0  Trouble concentrating 2 3 3 3  0  Moving slowly or fidgety/restless 0 2 1 1  0  Suicidal thoughts 0 1 0 0 0  PHQ-9 Score 7 19 10 10  0  Difficult doing work/chores - Somewhat difficult Somewhat difficult Somewhat difficult Not difficult at all  Some recent data might be hidden      ASSESSMENT AND PLAN:  Essential hypertension  Other depression - Plan: buPROPion (WELLBUTRIN SR) 200 MG 12 hr tablet  Class 3 severe obesity with serious comorbidity and body mass index (BMI) of 40.0 to 44.9 in adult, unspecified obesity type (HCC)  PLAN: Hypertension We discussed sodium restriction, working on healthy weight loss, and a regular exercise program as the means to achieve improved blood pressure control. Tamari agreed with this plan and agreed to follow up  as directed. We will continue to monitor her blood pressure as well as her progress with the above lifestyle modifications. She will continue her medications as prescribed and will watch for signs of hypotension as she continues her lifestyle modifications. Advised her on the proper way to check blood pressure and to check it daily. We will continue to monitor.   Depression with Emotional Eating Behaviors We discussed behavior modification techniques today to help Susan [name] deal with her emotional eating and depression. She has agreed to increase Wellbutrin SR to 200 mg qd #30 with no refills and agreed to follow up  as directed.   Obesity Susan [name] is currently in the action stage of change. As such, her goal is to continue with weight loss efforts She has agreed to follow the Category 2 plan with additional breakfast options.  Susan [name] has been instructed to work up to a goal of 150 minutes of combined cardio and strengthening exercise per week for weight loss and overall health benefits. We discussed the following Behavioral Modification Stratagies today: work on meal planning and easy cooking plans, emotional eating strategies and ways to avoid boredom eating   Susan [name] has agreed to follow up with our clinic in 2-3 weeks. She was informed of the importance of frequent follow up visits to maximize her success with intensive lifestyle modifications for her multiple health conditions.  ALLERGIES: Allergies  Allergen Reactions   Hydrochlorothiazide Other (See Comments)    Electrolyte imbalance   Lasix [Furosemide]     Electrolyte imbalance    MEDICATIONS: Current Outpatient Medications on File Prior to Visit  Medication Sig Dispense Refill   albuterol (PROVENTIL HFA;VENTOLIN HFA) 108 (90 Base) MCG/ACT inhaler INHALE 1 TO 2 PUFFS EVERY 4 HOURS AS NEEDED FOR COUGH, WHEEZING, AND SHORTNESS OF BREATH 18 g 2   amLODipine (NORVASC) 5 MG tablet TAKE 1 TABLET (5 MG TOTAL) BY MOUTH DAILY. 90 tablet 1   atenolol (TENORMIN) 25 MG tablet TAKE 1 TABLET EVERY DAY 90 tablet 1   atorvastatin (LIPITOR) 10 MG tablet TAKE 1 TABLET (10 MG TOTAL) BY MOUTH AT BEDTIME. 90 tablet 1   cholecalciferol (VITAMIN D3) 25 MCG (1000 UT) tablet Take 1,000 Units by mouth daily.     clotrimazole (LOTRIMIN) 1 % cream Apply 1 application topically 2 (two) times daily. (Patient taking differently: Apply 1 application topically as needed. ) 30 g 0   cyclobenzaprine (FLEXERIL) 5 MG tablet Take 5 mg by mouth at bedtime.      DULoxetine (CYMBALTA) 60 MG capsule TAKE 1 CAPSULE (60 MG TOTAL) BY MOUTH 2 (TWO) TIMES DAILY. 180 capsule 1     gabapentin (NEURONTIN) 300 MG capsule Take 600 mg by mouth 2 (two) times daily.      HYDROcodone-acetaminophen (NORCO/VICODIN) 5-325 MG tablet One-half to one pill by mouth daily PRN ([facility])     loratadine (CLARITIN) 10 MG tablet Take 1 tablet (10 mg total) by mouth daily as needed for allergies. 90 tablet 3   losartan (COZAAR) 100 MG tablet TAKE 1 TABLET EVERY DAY 90 tablet 1   tiotropium (SPIRIVA HANDIHALER) 18 MCG inhalation capsule INHALE THE CONTENTS OF 1 CAPSULE ONE TIME DAILY 90 capsule 1   vitamin B-12 (CYANOCOBALAMIN) 1000 MCG tablet Take 1,000 mcg by mouth daily.     Vitamin D, Ergocalciferol, (DRISDOL) 1.25 MG (50000 UT) CAPS capsule Take 1 capsule (50,000 Units total) by mouth every 7 (seven) days. 4 capsule 0   No current facility-administered medications on file prior to visit.  PAST MEDICAL HISTORY: Past Medical History:  Diagnosis Date   ADD (attention deficit disorder)    Alcohol abuse    Anemia    Anxiety    Aortic atherosclerosis ([place]) 02/20/2018   Chest CT Sept 2019   Arthritis    rheumatoid arthritis   Asthma    Bipolar disorder ([place])    Centrilobular emphysema ([place]) 02/20/2018   Chest CT Sept 2019   Constipation    Degenerative disc disease at L5-S1 level 09/28/2016   See ortho note May 2018   Depression    bipolar, hx of suicide attempt   Drug use    Dyspnea    with exertion   GERD (gastroesophageal reflux disease)    H/O suicide attempt    slit wrists   Hip pain    History of alcohol abuse    History of diverticulitis 2013   History of hepatitis C    HEP "C"--three years ago   History of MRSA infection 2013   HLD (hyperlipidemia)    Hypertension    Hypothyroidism    Knee pain    MRSA carrier Nov 2013   Multinodular thyroid    OSA (obstructive sleep apnea)    managed by Dr. Manuella Ghazi   Osteoporosis    Post-traumatic osteoarthritis of right knee 09/24/2015   Prediabetes    Sleep apnea     Dx 3 years ago.  Use C-PAP   Thyroid disease    Goiter   Vitamin B12 deficiency    Vitamin D deficiency disease     PAST SURGICAL HISTORY: Past Surgical History:  Procedure Laterality Date   BILATERAL SALPINGOOPHORECTOMY     due to abnormal mass   BREAST BIOPSY Left    neg   BREAST SURGERY Left 20 yrs ago   CESAREAN SECTION     COLONOSCOPY     COLONOSCOPY WITH PROPOFOL N/A 10/16/2017   Procedure: COLONOSCOPY WITH PROPOFOL;  Surgeon: Jonathon Bellows, MD;  Location: [facility] ENDOSCOPY;  Service: Gastroenterology;  Laterality: N/A;   HERNIA REPAIR  06/2011, July 2014   Ventral wall repair with Physiomesh   TONSILLECTOMY     TOTAL KNEE ARTHROPLASTY Right 05/10/2016   Procedure: TOTAL KNEE ARTHROPLASTY;  Surgeon: Corky Mull, MD;  Location: [facility] ORS;  Service: Orthopedics;  Laterality: Right;   TUBAL LIGATION      SOCIAL HISTORY: Social History   Tobacco Use   Smoking status: Current Every Day Smoker    Packs/day: 0.50    Years: 41.00    Pack years: 20.50    Types: Cigarettes   Smokeless tobacco: Never Used  Substance Use Topics   Alcohol use: No    Alcohol/week: 0.0 standard drinks    Comment: sober since October 2018   Drug use: No    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    FAMILY HISTORY: Family History  Problem Relation Age of Onset   Alcohol abuse Father    Heart attack Father    Arthritis Mother    Asthma Mother    Mental illness Mother    Thyroid disease Mother    COPD Mother    Heart disease Mother    Congestive Heart Failure Mother    Alcohol abuse Mother    Eating disorder Mother    Arthritis Brother    Mental illness Brother    Cancer Brother        non-hodkins lymphoma   Alcohol abuse Sister    Drug abuse Sister  Mental illness Sister    Mental illness Sister    Fibromyalgia Sister    Obesity Sister    Pneumonia Sister    Mental illness Sister    Alcohol abuse Sister    Drug abuse Sister    Diabetes Neg Hx     Stroke Neg Hx    Breast cancer Neg Hx     ROS: Review of Systems  Respiratory: Negative for shortness of breath.   Cardiovascular: Negative for chest pain.  Neurological: Negative for dizziness and headaches.  Psychiatric/Behavioral: Positive for depression. Negative for suicidal ideas.    PHYSICAL EXAM: Pt in no acute distress  RECENT LABS AND TESTS: BMET    Component Value Date/Time   NA 139 07/26/2018 1101   NA 136 04/29/2014 1133   K 4.7 07/26/2018 1101   K 4.2 04/29/2014 1133   CL 100 07/26/2018 1101   CL 103 04/29/2014 1133   CO2 23 07/26/2018 1101   CO2 30 04/29/2014 1133   GLUCOSE 96 07/26/2018 1101   GLUCOSE 80 08/16/2017 0840   GLUCOSE 85 04/29/2014 1133   BUN 13 07/26/2018 1101   BUN 9 04/29/2014 1133   CREATININE 0.97 07/26/2018 1101   CREATININE 0.79 08/16/2017 0840   CALCIUM 9.5 07/26/2018 1101   CALCIUM 9.1 04/29/2014 1133   GFRNONAA 65 07/26/2018 1101   GFRNONAA 84 08/16/2017 0840   GFRAA 75 07/26/2018 1101   GFRAA 97 08/16/2017 0840   Lab Results  Component Value Date   HGBA1C 5.9 (H) 07/26/2018   HGBA1C 5.2 08/16/2017   HGBA1C 5.6 10/14/2016   HGBA1C 5.3 04/12/2016   HGBA1C 6.1 (H) 10/09/2015   Lab Results  Component Value Date   INSULIN 31.0 (H) 07/26/2018   CBC    Component Value Date/Time   WBC 11.7 (H) 07/26/2018 1101   WBC 10.0 08/16/2017 0840   RBC 4.79 07/26/2018 1101   RBC 4.48 08/16/2017 0840   HGB 15.2 07/26/2018 1101   HCT 43.4 07/26/2018 1101   PLT 221 08/16/2017 0840   PLT 241 10/09/2015 0933   MCV 91 07/26/2018 1101   MCV 91 04/29/2014 1133   MCH 31.7 07/26/2018 1101   MCH 31.7 08/16/2017 0840   MCHC 35.0 07/26/2018 1101   MCHC 35.1 08/16/2017 0840   RDW 11.7 07/26/2018 1101   RDW 13.1 04/29/2014 1133   LYMPHSABS 5.1 (H) 07/26/2018 1101   LYMPHSABS 2.3 11/20/2012 1005   MONOABS 390 10/14/2016 0903   MONOABS 0.5 11/20/2012 1005   EOSABS 0.4 07/26/2018 1101   EOSABS 0.2 11/20/2012 1005   BASOSABS 0.1  07/26/2018 1101   BASOSABS 0.1 11/20/2012 1005   Iron/TIBC/Ferritin/ %Sat No results found for: IRON, TIBC, FERRITIN, IRONPCTSAT Lipid Panel     Component Value Date/Time   CHOL 158 07/26/2018 1101   TRIG 134 07/26/2018 1101   HDL 44 07/26/2018 1101   CHOLHDL 3.5 03/06/2018 1144   CHOLHDL 3.5 08/16/2017 0840   VLDL 27 10/14/2016 0903   LDLCALC 87 07/26/2018 1101   LDLCALC 74 08/16/2017 0840   Hepatic Function Panel     Component Value Date/Time   PROT 7.0 07/26/2018 1101   PROT 7.1 03/13/2013 1509   ALBUMIN 4.7 07/26/2018 1101   ALBUMIN 3.5 03/13/2013 1509   AST 19 07/26/2018 1101   AST 90 (H) 03/13/2013 1509   ALT 19 07/26/2018 1101   ALT 96 (H) 03/13/2013 1509   ALKPHOS 80 07/26/2018 1101   ALKPHOS 110 03/13/2013 1509   BILITOT 0.4  07/26/2018 1101   BILITOT 0.6 03/13/2013 1509      Component Value Date/Time   TSH 1.430 07/26/2018 1101   TSH 0.85 08/16/2017 0840   TSH 0.94 10/14/2016 0903      I, Renee Ramus, am acting as transcriptionist for Dennard Nip, MD  I have reviewed the above documentation for accuracy and completeness, and I agree with the above. -Dennard Nip, MD

## 2018-11-05 NOTE — Progress Notes (Signed)
Office: [phone]  /  Fax: [phone]    Date: November 07, 2018   Appointment Start Time: 2:36pm Duration: 25 minutes Provider: Glennie Isle, Psy.D. Type of Session: Individual Therapy  Location of Patient: Home Location of Provider: Provider's Home Type of Contact: Telepsychological Visit via [employer] Web[employer]   Session Content: Susan [name] is a 58 y.o. female presenting via [place] for a follow-up appointment to address the previously established treatment goal of decreasing emotional eating. Of note, this provider called Susan [name] at 2:33pm as she did not present for today's appointment. Assistance was provided to connect. As Susan, today's appointment was initiated 6 minutes late. Today's appointment was a telepsychological visit, as this provider's clinic is seeing a limited number of patients for in-person visits due to COVID-19. Therapeutic services will resume to in-person appointments once deemed appropriate. Susan [name] expressed understanding regarding the rationale for telepsychological services, and provided verbal consent for today's appointment. Prior to proceeding with today's appointment, Susan [name]'s physical location at the time of this appointment was obtained. Susan [name] reported she was at home and provided the address. In the event of technical difficulties, Susan [name] shared a phone number she could be reached at. Susan [name] and this provider participated in today's telepsychological service. Also, Susan [name] denied anyone else being present in the room or on the WebEx appointment.  This provider conducted a brief check-in and verbally administered the PHQ-9 and GAD-7. Susan [name] reported she continues to go to school and she noted watching her grandchildren over the weekend. This provider checked-in regarding Susan [name]'s appointment with her new mental health provider. She noted, "It went really well and I really liked him." Susan [name] stated their next appointment is "next Friday." Susan [name] requested an additional visit  with this provider to "see how it goes" with her new provider prior to terminating. A risk assessment was completed. Susan [name] denied experiencing suicidal and homicidal ideation, plan, and intent since the last appointment with this provider. She reported she continues to have easy access to the developed safety plan, and continues to acknowledge understanding regarding the importance of reaching out to trusted individuals and/or emergency resources if she is unable to ensure safety. Notably, Susan [name] reported an increase in fatigue; therefore, it was recommended she reach out to her PCP. Susan [name] agreed. She also discussed ongoing worry regarding her sons.   Regarding eating, Susan [name] stated, "I gained back what I had lost. I'm right back where I started." She discussed a decreased appetite when her grandchildren are visiting and she does not eat. When she does eat with them, she described the food as "crap." In regard to emotional eating, Susan [name] reported, "It's been pretty good. I'm trying to stay aware." She acknowledged one episode of emotional eating since the last appointment with this provider. Since the onset of treatment with this provider, Susan [name] described a reduction in emotional eating. Moreover, she discussed engaging in pleasurable [name]. Susan [name] reported incorporating pleasurable [name] into her daily routine has been "really nice." She recalled when she would "take Sundays" for herself to drink, but noted it was "nice" to take time for herself and enjoy the day without drinking alcohol. Associated thoughts and feelings were further processed. Susan [name], Susan [name] was receptive to today's session as evidenced by openness to sharing, responsiveness to feedback, and willingness to continue engaging in pleasurable [name].  Mental Status Examination:   Appearance: neat Behavior: cooperative Mood: euthymic Affect: mood  congruent Speech: normal in rate, volume, and tone Eye Contact: appropriate Psychomotor Activity: appropriate Thought Process: linear, logical, and goal directed  Content/Perceptual Disturbances: denies suicidal and homicidal ideation, plan, and intent and no hallucinations, delusions, bizarre thinking or behavior reported or observed Orientation: time, person, place and purpose of appointment Cognition/Sensorium: memory, attention, language, and fund of knowledge intact  Insight: good Judgment: good  Structured Assessment Results: The Patient Health Questionnaire-9 (PHQ-9) is a self-report measure that assesses symptoms and severity of depression over the course of the last two weeks. Susan [name] obtained a score of 5 suggesting mild depression. Susan [name] finds the endorsed symptoms to be not difficult at all. Little interest or pleasure in doing things 1  Feeling down, depressed, or hopeless 0  Trouble falling or staying asleep, or sleeping too much 0  Feeling tired or having little energy 3  Poor appetite or overeating 0  Feeling bad about yourself --- or that you are a failure or have let yourself or your family down 1  Trouble concentrating on things, Susan as reading the newspaper or watching television 0  Moving or speaking so slowly that other people could have noticed? Or the opposite --- being so fidgety or restless that you have been moving around a lot more than usual 0  Thoughts that you would be better off dead or hurting yourself in some way 0  PHQ-9 Score 5    The Generalized Anxiety Disorder-7 (GAD-7) is a brief self-report measure that assesses symptoms of anxiety over the course of the last two weeks. Susan [name] obtained a score of 4 suggesting minimal anxiety. Susan [name] finds the endorsed symptoms to be not difficult at all. Feeling nervous, anxious, on edge 0  Not being able to stop or control worrying 0  Worrying  too much about different things 1  Trouble relaxing 0  Being so restless that it's hard to sit still 1  Becoming easily annoyed or irritable 1  Feeling afraid as if something awful might happen 1  GAD-7 Score 4   Interventions:  Conducted a brief chart review Verbal administration of PHQ-9 and GAD-7 for symptom monitoring Provided empathic reflections and validation Reviewed content from the previous session Conducted a risk assessment Processed thoughts and feelings Discussed termination planning Provided positive reinforcement Employed supportive psychotherapy interventions to facilitate reduced distress, and to improve coping skills with identified stressors  DSM-5 Diagnoses:  296.31 (F33.0) Major Depressive Disorder, Recurrent Episode, Mild, 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 Antavia has demonstrated progress in her goal as evidenced by increased awareness of hunger patterns and triggers for emotional eating. Despite deviations from the meal plan, Andreya reported an overall reduction in emotional eating and she continues to demonstrate willingness to engage in learned skills.   Plan: Estoria continues to appear able and willing to participate as evidenced by engagement in reciprocal conversation, and asking questions for clarification as appropriate. Since she is established with a new mental health provider and has an appointment scheduled with him next week, the next appointment will be scheduled in three weeks, which will be via [employer]. Once this provider's office resumes in-person appointments and it is deemed appropriate, Lattie will be notified. The next session will focus on the introduction of thought  defusion, as it was not introduced  during today's appointment based on Magaret's presenting concerns. Next appointment may also focus on termination.

## 2018-11-07 ENCOUNTER — Other Ambulatory Visit: Payer: Self-pay

## 2018-11-07 ENCOUNTER — Ambulatory Visit (INDEPENDENT_AMBULATORY_CARE_PROVIDER_SITE_OTHER): Payer: Medicare HMO | Admitting: Psychology

## 2018-11-07 DIAGNOSIS — F330 Major depressive disorder, recurrent, mild: Secondary | ICD-10-CM

## 2018-11-07 DIAGNOSIS — F1421 Cocaine dependence, in remission: Secondary | ICD-10-CM | POA: Diagnosis not present

## 2018-11-07 DIAGNOSIS — F1021 Alcohol dependence, in remission: Secondary | ICD-10-CM

## 2018-11-15 ENCOUNTER — Encounter (INDEPENDENT_AMBULATORY_CARE_PROVIDER_SITE_OTHER): Payer: Self-pay | Admitting: Family Medicine

## 2018-11-15 ENCOUNTER — Other Ambulatory Visit: Payer: Self-pay

## 2018-11-15 ENCOUNTER — Telehealth (INDEPENDENT_AMBULATORY_CARE_PROVIDER_SITE_OTHER): Payer: Medicare HMO | Admitting: Family Medicine

## 2018-11-15 DIAGNOSIS — E559 Vitamin D deficiency, unspecified: Secondary | ICD-10-CM

## 2018-11-15 DIAGNOSIS — Z6841 Body Mass Index (BMI) 40.0 and over, adult: Secondary | ICD-10-CM | POA: Diagnosis not present

## 2018-11-15 DIAGNOSIS — R7303 Prediabetes: Secondary | ICD-10-CM

## 2018-11-15 MED ORDER — VITAMIN D (ERGOCALCIFEROL) 1.25 MG (50000 UNIT) PO CAPS: 50000.0000 [IU] | ORAL_CAPSULE | ORAL | 0 refills | Status: DC

## 2018-11-16 ENCOUNTER — Ambulatory Visit (INDEPENDENT_AMBULATORY_CARE_PROVIDER_SITE_OTHER): Payer: Medicare HMO | Admitting: Psychology

## 2018-11-16 ENCOUNTER — Other Ambulatory Visit (INDEPENDENT_AMBULATORY_CARE_PROVIDER_SITE_OTHER): Payer: Self-pay | Admitting: Family Medicine

## 2018-11-16 DIAGNOSIS — F3289 Other specified depressive episodes: Secondary | ICD-10-CM

## 2018-11-20 NOTE — Progress Notes (Signed)
Office: [phone]  /  Fax: [phone] TeleHealth Visit:  Susan [name] has verbally consented to this TeleHealth visit today. The patient is located at home, the provider is located at the [employer] [facility] office. The participants in this visit include the listed provider and patient. Susan [name] was unable to use realtime audiovisual technology today and the telehealth visit was conducted via telephone.   HPI:   Chief Complaint: OBESITY Susan [name] is here to discuss her progress with her obesity treatment plan. She is on the Category 2 plan with breakfast options and is following her eating plan approximately 80 % of the time. She states she is walking, taking a carpentry class, and using a push mow to cut her grass once a week. Susan [name] states she has maintained her weight at 226 lbs since our last visit. She has tried to be more active, but is getting frustrated that she has only maintained her weight for the last 4 months. She states her blood pressure range between 130-140/79-80.  We were unable to weigh the patient today for this TeleHealth visit. She feels as if she has maintained her weight since her last visit. She has lost 6 lbs since starting treatment with [name].  Vitamin D Deficiency Susan [name] has a diagnosis of vitamin D deficiency. She is stable on prescription Vit D, but level is not yet at goal. She denies nausea, vomiting or muscle weakness.  Depression with Emotional Eating Behaviors Susan [name] increased Wellbutrin and feels she is more tired lately. She is sleeping very well but she thinks the fatigue may be due to the Wellbutrin. Susan [name] struggles with emotional eating and using food for comfort to the extent that it is negatively impacting her health. She often snacks when she is not hungry. Susan [name] sometimes feels she is out of control and then feels guilty that she made poor food choices. She has been working on behavior modification techniques to help reduce her emotional  eating and has been somewhat successful. She shows no sign of suicidal or homicidal ideations.  Depression screen Susan [name] [address] 2/9 09/06/2018 07/26/2018 07/23/2018 07/19/2018 05/24/2018  Decreased Interest 1 2 1 1  0  Down, Depressed, Hopeless 1 2 2 2  0  PHQ - 2 Score 2 4 3 3  0  Altered sleeping 0 3 0 0 0  Tired, decreased energy 1 3 3 3  0  Change in appetite 1 1 0 0 0  Feeling bad or failure about yourself  1 2 0 0 0  Trouble concentrating 2 3 3 3  0  Moving slowly or fidgety/restless 0 2 1 1  0  Suicidal thoughts 0 1 0 0 0  PHQ-9 Score 7 19 10 10  0  Difficult doing work/chores - Somewhat difficult Somewhat difficult Somewhat difficult Not difficult at all  Some recent data might be hidden    ASSESSMENT AND PLAN:  Prediabetes  Vitamin D deficiency - Plan: Vitamin D, Ergocalciferol, (DRISDOL) 1.25 MG (50000 UT) CAPS capsule  Class 3 severe obesity with serious comorbidity and body mass index (BMI) of 40.0 to 44.9 in adult, unspecified obesity type (HCC)  PLAN:  Vitamin D Deficiency Susan [name] was informed that low vitamin D levels contributes to fatigue and are associated with obesity, breast, and colon cancer. Haeven agrees to continue taking prescription Vit D 50,000 IU every week #4 and we will refill for 1 month. She will follow up for routine testing of vitamin D, at least 2-3 times per year. She was informed of the risk of over-replacement  of vitamin D and agrees to not increase her dose unless she discusses this with [name] first. We will recheck labs in 1 month. Susan [name] agrees to follow up with our clinic in 2 to 3 weeks.  Depression with Emotional Eating Behaviors We discussed behavior modification techniques today to help Susan [name] deal with her emotional eating and depression. Susan [name] agrees to change Wellbutrin to bedtime, and she agrees to follow up with our clinic in 2 to 3 weeks.  I spent > than 50% of the 25 minute visit on counseling as documented in the note.  Obesity Makinlee is currently in  the action stage of change. As such, her goal is to continue with weight loss efforts She has agreed to change to follow a lower carbohydrate, vegetable and lean protein rich diet plan Susan [name] has been instructed to work up to a goal of 150 minutes of combined cardio and strengthening exercise per week for weight loss and overall health benefits. We discussed the following Behavioral Modification Strategies today: increasing lean protein intake, decreasing simple carbohydrates  and increasing vegetables   Susan [name] has agreed to follow up with our clinic in 2 to 3 weeks. She was informed of the importance of frequent follow up visits to maximize her success with intensive lifestyle modifications for her multiple health conditions.  ALLERGIES: Allergies  Allergen Reactions   Hydrochlorothiazide Other (See Comments)    Electrolyte imbalance   Lasix [Furosemide]     Electrolyte imbalance    MEDICATIONS: Current Outpatient Medications on File Prior to Visit  Medication Sig Dispense Refill   albuterol (PROVENTIL HFA;VENTOLIN HFA) 108 (90 Base) MCG/ACT inhaler INHALE 1 TO 2 PUFFS EVERY 4 HOURS AS NEEDED FOR COUGH, WHEEZING, AND SHORTNESS OF BREATH 18 g 2   amLODipine (NORVASC) 5 MG tablet TAKE 1 TABLET (5 MG TOTAL) BY MOUTH DAILY. 90 tablet 1   atenolol (TENORMIN) 25 MG tablet TAKE 1 TABLET EVERY DAY 90 tablet 1   atorvastatin (LIPITOR) 10 MG tablet TAKE 1 TABLET (10 MG TOTAL) BY MOUTH AT BEDTIME. 90 tablet 1   buPROPion (WELLBUTRIN SR) 200 MG 12 hr tablet Take 1 tablet (200 mg total) by mouth daily. 30 tablet 0   cholecalciferol (VITAMIN D3) 25 MCG (1000 UT) tablet Take 1,000 Units by mouth daily.     clotrimazole (LOTRIMIN) 1 % cream Apply 1 application topically 2 (two) times daily. (Patient taking differently: Apply 1 application topically as needed. ) 30 g 0   cyclobenzaprine (FLEXERIL) 5 MG tablet Take 5 mg by mouth at bedtime.      DULoxetine (CYMBALTA) 60 MG capsule TAKE 1  CAPSULE (60 MG TOTAL) BY MOUTH 2 (TWO) TIMES DAILY. 180 capsule 1   gabapentin (NEURONTIN) 300 MG capsule Take 600 mg by mouth 2 (two) times daily.      HYDROcodone-acetaminophen (NORCO/VICODIN) 5-325 MG tablet One-half to one pill by mouth daily PRN ([facility])     loratadine (CLARITIN) 10 MG tablet Take 1 tablet (10 mg total) by mouth daily as needed for allergies. 90 tablet 3   losartan (COZAAR) 100 MG tablet TAKE 1 TABLET EVERY DAY 90 tablet 1   tiotropium (SPIRIVA HANDIHALER) 18 MCG inhalation capsule INHALE THE CONTENTS OF 1 CAPSULE ONE TIME DAILY 90 capsule 1   vitamin B-12 (CYANOCOBALAMIN) 1000 MCG tablet Take 1,000 mcg by mouth daily.     No current facility-administered medications on file prior to visit.     PAST MEDICAL HISTORY: Past Medical History:  Diagnosis Date  ADD (attention deficit disorder)    Alcohol abuse    Anemia    Anxiety    Aortic atherosclerosis ([place]) 02/20/2018   Chest CT Sept 2019   Arthritis    rheumatoid arthritis   Asthma    Bipolar disorder ([place])    Centrilobular emphysema ([name]) 02/20/2018   Chest CT Sept 2019   Constipation    Degenerative disc disease at L5-S1 level 09/28/2016   See ortho note May 2018   Depression    bipolar, hx of suicide attempt   Drug use    Dyspnea    with exertion   GERD (gastroesophageal reflux disease)    H/O suicide attempt    slit wrists   Hip pain    History of alcohol abuse    History of diverticulitis 2013   History of hepatitis C    HEP "C"--three years ago   History of MRSA infection 2013   HLD (hyperlipidemia)    Hypertension    Hypothyroidism    Knee pain    MRSA carrier Nov 2013   Multinodular thyroid    OSA (obstructive sleep apnea)    managed by Dr. Manuella Ghazi   Osteoporosis    Post-traumatic osteoarthritis of right knee 09/24/2015   Prediabetes    Sleep apnea     Dx 3 years ago. Use C-PAP   Thyroid disease    Goiter   Vitamin B12 deficiency    Vitamin D  deficiency disease     PAST SURGICAL HISTORY: Past Surgical History:  Procedure Laterality Date   BILATERAL SALPINGOOPHORECTOMY     due to abnormal mass   BREAST BIOPSY Left    neg   BREAST SURGERY Left 20 yrs ago   CESAREAN SECTION     COLONOSCOPY     COLONOSCOPY WITH PROPOFOL N/A 10/16/2017   Procedure: COLONOSCOPY WITH PROPOFOL;  Surgeon: Jonathon Bellows, MD;  Location: [name];  Service: Gastroenterology;  Laterality: N/A;   HERNIA REPAIR  06/2011, July 2014   Ventral wall repair with Physiomesh   TONSILLECTOMY     TOTAL KNEE ARTHROPLASTY Right 05/10/2016   Procedure: TOTAL KNEE ARTHROPLASTY;  Surgeon: Corky Mull, MD;  Location: [facility] ORS;  Service: Orthopedics;  Laterality: Right;   TUBAL LIGATION      SOCIAL HISTORY: Social History   Tobacco Use   Smoking status: Current Every Day Smoker    Packs/day: 0.50    Years: 41.00    Pack years: 20.50    Types: Cigarettes   Smokeless tobacco: Never Used  Substance Use Topics   Alcohol use: No    Alcohol/week: 0.0 standard drinks    Comment: sober since October 2018   Drug use: No    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    FAMILY HISTORY: Family History  Problem Relation Age of Onset   Alcohol abuse Father    Heart attack Father    Arthritis Mother    Asthma Mother    Mental illness Mother    Thyroid disease Mother    COPD Mother    Heart disease Mother    Congestive Heart Failure Mother    Alcohol abuse Mother    Eating disorder Mother    Arthritis Brother    Mental illness Brother    Cancer Brother        non-hodkins lymphoma   Alcohol abuse Sister    Drug abuse Sister    Mental illness Sister    Mental illness Sister  Fibromyalgia Sister    Obesity Sister    Pneumonia Sister    Mental illness Sister    Alcohol abuse Sister    Drug abuse Sister    Diabetes Neg Hx    Stroke Neg Hx    Breast cancer Neg Hx     ROS: Review of Systems    Constitutional: Positive for malaise/fatigue. Negative for weight loss.  Gastrointestinal: Negative for nausea and vomiting.  Musculoskeletal:       Negative muscle weakness  Neurological: Headaches: .mmm.  Psychiatric/Behavioral: Positive for depression. Negative for suicidal ideas.    PHYSICAL EXAM: Pt in no acute distress  RECENT LABS AND TESTS: BMET    Component Value Date/Time   NA 139 07/26/2018 1101   NA 136 04/29/2014 1133   K 4.7 07/26/2018 1101   K 4.2 04/29/2014 1133   CL 100 07/26/2018 1101   CL 103 04/29/2014 1133   CO2 23 07/26/2018 1101   CO2 30 04/29/2014 1133   GLUCOSE 96 07/26/2018 1101   GLUCOSE 80 08/16/2017 0840   GLUCOSE 85 04/29/2014 1133   BUN 13 07/26/2018 1101   BUN 9 04/29/2014 1133   CREATININE 0.97 07/26/2018 1101   CREATININE 0.79 08/16/2017 0840   CALCIUM 9.5 07/26/2018 1101   CALCIUM 9.1 04/29/2014 1133   GFRNONAA 65 07/26/2018 1101   GFRNONAA 84 08/16/2017 0840   GFRAA 75 07/26/2018 1101   GFRAA 97 08/16/2017 0840   Lab Results  Component Value Date   HGBA1C 5.9 (H) 07/26/2018   HGBA1C 5.2 08/16/2017   HGBA1C 5.6 10/14/2016   HGBA1C 5.3 04/12/2016   HGBA1C 6.1 (H) 10/09/2015   Lab Results  Component Value Date   INSULIN 31.0 (H) 07/26/2018   CBC    Component Value Date/Time   WBC 11.7 (H) 07/26/2018 1101   WBC 10.0 08/16/2017 0840   RBC 4.79 07/26/2018 1101   RBC 4.48 08/16/2017 0840   HGB 15.2 07/26/2018 1101   HCT 43.4 07/26/2018 1101   PLT 221 08/16/2017 0840   PLT 241 10/09/2015 0933   MCV 91 07/26/2018 1101   MCV 91 04/29/2014 1133   MCH 31.7 07/26/2018 1101   MCH 31.7 08/16/2017 0840   MCHC 35.0 07/26/2018 1101   MCHC 35.1 08/16/2017 0840   RDW 11.7 07/26/2018 1101   RDW 13.1 04/29/2014 1133   LYMPHSABS 5.1 (H) 07/26/2018 1101   LYMPHSABS 2.3 11/20/2012 1005   MONOABS 390 10/14/2016 0903   MONOABS 0.5 11/20/2012 1005   EOSABS 0.4 07/26/2018 1101   EOSABS 0.2 11/20/2012 1005   BASOSABS 0.1 07/26/2018  1101   BASOSABS 0.1 11/20/2012 1005   Iron/TIBC/Ferritin/ %Sat No results found for: IRON, TIBC, FERRITIN, IRONPCTSAT Lipid Panel     Component Value Date/Time   CHOL 158 07/26/2018 1101   TRIG 134 07/26/2018 1101   HDL 44 07/26/2018 1101   CHOLHDL 3.5 03/06/2018 1144   CHOLHDL 3.5 08/16/2017 0840   VLDL 27 10/14/2016 0903   LDLCALC 87 07/26/2018 1101   LDLCALC 74 08/16/2017 0840   Hepatic Function Panel     Component Value Date/Time   PROT 7.0 07/26/2018 1101   PROT 7.1 03/13/2013 1509   ALBUMIN 4.7 07/26/2018 1101   ALBUMIN 3.5 03/13/2013 1509   AST 19 07/26/2018 1101   AST 90 (H) 03/13/2013 1509   ALT 19 07/26/2018 1101   ALT 96 (H) 03/13/2013 1509   ALKPHOS 80 07/26/2018 1101   ALKPHOS 110 03/13/2013 1509   BILITOT 0.4 07/26/2018  1101   BILITOT 0.6 03/13/2013 1509      Component Value Date/Time   TSH 1.430 07/26/2018 1101   TSH 0.85 08/16/2017 0840   TSH 0.94 10/14/2016 0903      I, Trixie Dredge, am acting as transcriptionist for Dennard Nip, MD I have reviewed the above documentation for accuracy and completeness, and I agree with the above. -Dennard Nip, MD

## 2018-11-27 NOTE — Progress Notes (Signed)
Office: [phone]  /  Fax: [phone]    Date: November 28, 2018   Appointment Start Time: 2:38pm Duration: 27 minutes Provider: Glennie Isle, Psy.D. Type of Session: Individual Therapy  Location of Patient: Home Location of Provider: Provider's Home Type of Contact: Telepsychological Visit via [employer] WebEx   Session Content: Susan [name] is a 58 y.o. female presenting via [place] for a follow-up appointment to address the previously established treatment goal of decreasing emotional eating. Of note, this provider called Susan [name] at 2:31pm to provide assistance with connecting to today's appointment. A HIPAA compliant voicemail was left requesting a call back. This provider tried Susan [name] again at 2:38pm and she explained she was running behind, but could complete today's appointment. She acknowledged understanding it would not be for the full duration due to the late start, and consented to proceed. Today's appointment was a telepsychological visit, as this provider's clinic is seeing a limited number of patients for in-person visits due to COVID-19. Therapeutic services will resume to in-person appointments once deemed appropriate. Susan [name] expressed understanding regarding the rationale for telepsychological services, and provided verbal consent for today's appointment. Prior to proceeding with today's appointment, Susan [name]'s physical location at the time of this appointment was obtained. Susan [name] reported she was at home and provided the address. In the event of technical difficulties, Susan [name] shared a phone number she could be reached at. Susan [name] and this provider participated in today's telepsychological service. Also, Susan [name] denied anyone else being present in the room or on the WebEx appointment.  This provider conducted a brief check-in and verbally administered the PHQ-9 and GAD-7. A risk assessment was completed. Susan [name] denied experiencing suicidal and homicidal ideation, plan, and intent since the  last appointment with this provider. She reported she continues to have easy access to the developed safety plan, and continues to acknowledge understanding regarding the importance of reaching out to trusted individuals and/or emergency resources if she is unable to ensure safety. She discussed using her safety plan (I.e., called a friend from [name]) as she felt she was "vulnerable for a relapse." This was positively reinforced. Susan [name] noted, "I'm still sober" and noted she continues to attend [name]. This was explored. Susan [name] shared, "This is around the time I relapsed last year." She shared she continues to worry about her children, and she discussed speaking with her sons recently via a video call. Susan [name] further shared her daughter's husband had her daughter arrested resulting in [place] having to assist with the process to have her released. Associated thoughts and feelings were processed. Susan [name] further shared she was able to discuss the aforementioned during her [place] meeting. Susan [name] reported she continues to receive support from [name] and her brother. She noted, "My brother keeps tabs on me." She reported she is "coming out of a funk" and added, "I'm definitely hopeful." Susan [name] further shared her next appointment with her new mental health provider is on December 04, 2018. She added, "We meet about every two weeks." It was recommended she reach out to him and request an earlier appointment to address sobriety concerns. She agreed. Furthermore, Susan [name] acknowledged engaging in "mindless eating." She noted, "I tried to do better," and dicussed eating fruits when engaging in mindless eating. This was positively reinforced. Due to recent stressors and feeling "overwhelmed," this provider encouraged Susan [name] to focus on self-care. She indicated she started reading every night prior to going to bed and she was receptive to reviewing the handout on pleasurable activities previously shared. This provider and [name] discussed  termination of services as previously planned; however, [name] requested an additional appointment. Overall, Susan [name] was receptive to today's session as evidenced by openness to sharing, responsiveness to feedback, and willingness to continue engaging in learned skills. At the end of today's appointment, she stated, "I'm good."   Mental Status Examination:  Appearance: neat Behavior: cooperative Mood: sad Affect: mood congruent Speech: normal in rate, volume, and tone Eye Contact: appropriate Psychomotor Activity: appropriate Thought Process: linear, logical, and goal directed  Content/Perceptual Disturbances: denies suicidal and homicidal ideation, plan, and intent and no hallucinations, delusions, bizarre thinking or behavior reported or observed Orientation: time, person, place and purpose of appointment Cognition/Sensorium: memory, attention, language, and fund of knowledge intact  Insight: good Judgment: good  Structured Assessment Results: The Patient Health Questionnaire-9 (PHQ-9) is a self-report measure that assesses symptoms and severity of depression over the course of the last two weeks. Susan [name] obtained a score of 2 suggesting minimal depression. Susan [name] finds the endorsed symptoms to be not difficult at all. Susan [name] in doing things 0  Feeling down, depressed, or hopeless 0  Trouble falling or staying asleep, or sleeping too much 0  Feeling tired or having Susan energy 1  Poor appetite or overeating 0  Feeling bad about yourself --- or that you are a failure or have let yourself or your family down 1  Trouble concentrating on things, such as reading the newspaper or watching television 0  Moving or speaking so slowly that other people could have noticed? Or the opposite --- being so fidgety or restless that you have been moving around a lot more than usual 0  Thoughts that you would be better off dead or hurting yourself in some way 0  PHQ-9 Score 2    The  Generalized Anxiety Disorder-7 (GAD-7) is a brief self-report measure that assesses symptoms of anxiety over the course of the last two weeks. Jisella obtained a score of 6 suggesting mild anxiety. Theodosia finds the endorsed symptoms to be somewhat difficult. Feeling nervous, anxious, on edge 1  Not being able to stop or control worrying 1  Worrying too much about different things 0  Trouble relaxing 1  Being so restless that it's hard to sit still 0  Becoming easily annoyed or irritable 0  Feeling afraid as if something awful might happen 3  GAD-7 Score 6   Interventions:  Conducted a brief chart review Verbal administration of PHQ-9 and GAD-7 for symptom monitoring Provided empathic reflections and validation Reviewed content from the previous session Conducted a risk assessment Processed thoughts and feelings Employed supportive psychotherapy interventions to facilitate reduced distress, and to improve coping skills with identified stressors  DSM-5 Diagnoses:  296.31 (F33.0) Major Depressive Disorder, Recurrent Episode, Mild, 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 Deriona has demonstrated progress in her goal as evidenced by increased awareness of hunger patterns and triggers for emotional eating. She continues to demonstrate willingness to engage in learned skills and described making better choices when engaging in emotional eating recently.   Plan: Sanela continues to appear able and willing to participate as evidenced by engagement in reciprocal conversation, and asking questions for clarification as appropriate. The next appointment will be scheduled in two weeks, which will be via [employer]. Once this provider's  office resumes in-person appointments and it is deemed appropriate, [place] will be notified. The next session will focus on reviewing learned skills, possible introduction of thought defusion, and termination. Thought defusion was not introduced during today's appointment due to [facility] presenting concerns.

## 2018-11-28 ENCOUNTER — Other Ambulatory Visit: Payer: Self-pay

## 2018-11-28 ENCOUNTER — Ambulatory Visit (INDEPENDENT_AMBULATORY_CARE_PROVIDER_SITE_OTHER): Payer: Medicare HMO | Admitting: Psychology

## 2018-11-28 DIAGNOSIS — F1021 Alcohol dependence, in remission: Secondary | ICD-10-CM | POA: Diagnosis not present

## 2018-11-28 DIAGNOSIS — F1421 Cocaine dependence, in remission: Secondary | ICD-10-CM

## 2018-11-28 DIAGNOSIS — F330 Major depressive disorder, recurrent, mild: Secondary | ICD-10-CM | POA: Diagnosis not present

## 2018-11-29 ENCOUNTER — Encounter (INDEPENDENT_AMBULATORY_CARE_PROVIDER_SITE_OTHER): Payer: Self-pay | Admitting: Family Medicine

## 2018-11-29 ENCOUNTER — Encounter (INDEPENDENT_AMBULATORY_CARE_PROVIDER_SITE_OTHER): Payer: Self-pay

## 2018-11-29 ENCOUNTER — Telehealth (INDEPENDENT_AMBULATORY_CARE_PROVIDER_SITE_OTHER): Payer: Medicare HMO | Admitting: Family Medicine

## 2018-11-29 ENCOUNTER — Other Ambulatory Visit: Payer: Self-pay

## 2018-11-29 DIAGNOSIS — Z6841 Body Mass Index (BMI) 40.0 and over, adult: Secondary | ICD-10-CM | POA: Diagnosis not present

## 2018-11-29 DIAGNOSIS — F3289 Other specified depressive episodes: Secondary | ICD-10-CM

## 2018-11-29 MED ORDER — BUPROPION HCL ER (SR) 200 MG PO TB12: 200.0000 mg | ORAL_TABLET | Freq: Every day | ORAL | 0 refills | Status: DC

## 2018-12-03 NOTE — Progress Notes (Signed)
Office: [phone]  /  Fax: [phone] TeleHealth Visit:  Susan [name] has verbally consented to this TeleHealth visit today. The patient is located at home, the provider is located at the [employer] [facility] office. The participants in this visit include the listed provider and patient and any and all parties involved. The visit was conducted today via telephone. Susan [name] was unable to use realtime audiovisual technology today and the telehealth visit was conducted via telephone.  HPI:   Chief Complaint: OBESITY Susan [name] is here to discuss her progress with her obesity treatment plan. She is on the Category 2 plan and is following her eating plan approximately 50 % of the time. She states she is exercising 0 minutes 0 times per week. Susan [name] states she has gained weight. She was 229 pounds this morning. Susan [name] is struggling to follow her Category 2 plan closely. She was supposed to change to the low carb plan, but she did not receive her plan last visit. We were unable to weigh the patient today for this TeleHealth visit. She feels as if she has gained weight since her last visit. She has lost 0 lbs since starting treatment with [name].  Depression with emotional eating behaviors Susan [name] struggles with emotional eating and using food for comfort to the extent that it is negatively impacting her health. She often snacks when she is not hungry. Susan [name] sometimes feels she is out of control and then feels guilty that she made poor food choices. Her mood is stable on Cymbalta and Wellbutrin. She notes a decrease in emotional eating, but she still struggles in the late evening. She has been working on behavior modification techniques to help reduce her emotional eating and has been somewhat successful. She shows no sign of suicidal or homicidal ideations.  ASSESSMENT AND PLAN:  Class 3 severe obesity with serious comorbidity and body mass index (BMI) of 40.0 to 44.9 in adult, unspecified obesity  type (Susan [name])  Other depression - Plan: buPROPion (WELLBUTRIN SR) 200 MG 12 hr tablet  PLAN:  Depression with Emotional Eating Behaviors Emotional eating strategies were discussed at length today. Susan [name] has agreed to continue Cymbalta and Wellbutrin 200 mg daily #30 with no refills and follow up as directed.  I spent > than 50% of the 25 minute visit on counseling as documented in the note.  Obesity Susan [name] is currently in the action stage of change. As such, her goal is to continue with weight loss efforts She has agreed to change to the lower carbohydrate, vegetable and lean protein rich diet plan. We will send low carb plan through MyChart. Susan [name] has been instructed to work up to a goal of 150 minutes of combined cardio and strengthening exercise per week for weight loss and overall health benefits. We discussed the following Behavioral Modification Strategies today: increasing lean protein intake, decreasing simple carbohydrates, work on meal planning and easy cooking plans and emotional eating strategies  Susan [name] has agreed to follow up with our clinic in 2 weeks. She was informed of the importance of frequent follow up visits to maximize her success with intensive lifestyle modifications for her multiple health conditions.  ALLERGIES: Allergies  Allergen Reactions  . Hydrochlorothiazide Other (See Comments)    Electrolyte imbalance  . Lasix [Furosemide]     Electrolyte imbalance    MEDICATIONS: Current Outpatient Medications on File Prior to Visit  Medication Sig Dispense Refill  . albuterol (PROVENTIL HFA;VENTOLIN HFA) 108 (90 Base) MCG/ACT inhaler INHALE 1 TO 2 PUFFS  EVERY 4 HOURS AS NEEDED FOR COUGH, WHEEZING, AND SHORTNESS OF BREATH 18 g 2  . amLODipine (NORVASC) 5 MG tablet TAKE 1 TABLET (5 MG TOTAL) BY MOUTH DAILY. 90 tablet 1  . atenolol (TENORMIN) 25 MG tablet TAKE 1 TABLET EVERY DAY 90 tablet 1  . atorvastatin (LIPITOR) 10 MG tablet TAKE 1 TABLET (10 MG TOTAL) BY MOUTH  AT BEDTIME. 90 tablet 1  . cholecalciferol (VITAMIN D3) 25 MCG (1000 UT) tablet Take 1,000 Units by mouth daily.    . clotrimazole (LOTRIMIN) 1 % cream Apply 1 application topically 2 (two) times daily. (Patient taking differently: Apply 1 application topically as needed. ) 30 g 0  . cyclobenzaprine (FLEXERIL) 5 MG tablet Take 5 mg by mouth at bedtime.     . DULoxetine (CYMBALTA) 60 MG capsule TAKE 1 CAPSULE (60 MG TOTAL) BY MOUTH 2 (TWO) TIMES DAILY. 180 capsule 1  . gabapentin (NEURONTIN) 300 MG capsule Take 600 mg by mouth 2 (two) times daily.     Marland Kitchen HYDROcodone-acetaminophen (NORCO/VICODIN) 5-325 MG tablet One-half to one pill by mouth daily PRN ([facility])    . loratadine (CLARITIN) 10 MG tablet Take 1 tablet (10 mg total) by mouth daily as needed for allergies. 90 tablet 3  . losartan (COZAAR) 100 MG tablet TAKE 1 TABLET EVERY DAY 90 tablet 1  . tiotropium (SPIRIVA HANDIHALER) 18 MCG inhalation capsule INHALE THE CONTENTS OF 1 CAPSULE ONE TIME DAILY 90 capsule 1  . vitamin B-12 (CYANOCOBALAMIN) 1000 MCG tablet Take 1,000 mcg by mouth daily.    . Vitamin D, Ergocalciferol, (DRISDOL) 1.25 MG (50000 UT) CAPS capsule Take 1 capsule (50,000 Units total) by mouth every 7 (seven) days. 4 capsule 0   No current facility-administered medications on file prior to visit.     PAST MEDICAL HISTORY: Past Medical History:  Diagnosis Date  . ADD (attention deficit disorder)   . Alcohol abuse   . Anemia   . Anxiety   . Aortic atherosclerosis ([place]) 02/20/2018   Chest CT Sept 2019  . Arthritis    rheumatoid arthritis  . Asthma   . Bipolar disorder ([place])   . Centrilobular emphysema ([place]) 02/20/2018   Chest CT Sept 2019  . Constipation   . Degenerative disc disease at L5-S1 level 09/28/2016   See ortho note May 2018  . Depression    bipolar, hx of suicide attempt  . Drug use   . Dyspnea    with exertion  . GERD (gastroesophageal reflux disease)   . H/O suicide attempt    slit wrists  . Hip pain    . History of alcohol abuse   . History of diverticulitis 2013  . History of hepatitis C    HEP "C"--three years ago  . History of MRSA infection 2013  . HLD (hyperlipidemia)   . Hypertension   . Hypothyroidism   . Knee pain   . 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 Multinodular thyroid   . OSA (obstructive sleep apnea)    managed by Dr. Manuella Ghazi  . Osteoporosis   . Post-traumatic osteoarthritis of right knee 09/24/2015  . Prediabetes   . Sleep apnea     Dx 3 years ago. Use C-PAP  . Thyroid disease    Goiter  . Vitamin B12 deficiency   . Vitamin D deficiency disease     PAST SURGICAL HISTORY: Past Surgical History:  Procedure Laterality Date  . BILATERAL SALPINGOOPHORECTOMY     due to abnormal mass  .  BREAST BIOPSY Left    neg  . BREAST SURGERY Left 20 yrs ago  . CESAREAN SECTION    . COLONOSCOPY    . COLONOSCOPY WITH PROPOFOL N/A 10/16/2017   Procedure: COLONOSCOPY WITH PROPOFOL;  Surgeon: Jonathon Bellows, MD;  Location: [facility] ENDOSCOPY;  Service: Gastroenterology;  Laterality: N/A;  . HERNIA REPAIR  06/2011, July 2014   Ventral wall repair with Physiomesh  . TONSILLECTOMY    . TOTAL KNEE ARTHROPLASTY Right 05/10/2016   Procedure: TOTAL KNEE ARTHROPLASTY;  Surgeon: Corky Mull, MD;  Location: [facility] ORS;  Service: Orthopedics;  Laterality: Right;  . TUBAL LIGATION      SOCIAL HISTORY: Social History   Tobacco Use  . Smoking status: Current Every Day Smoker    Packs/day: 0.50    Years: 41.00    Pack years: 20.50    Types: Cigarettes  . Smokeless tobacco: Never Used  Substance Use Topics  . Alcohol use: No    Alcohol/week: 0.0 standard drinks    Comment: sober since October 2018  . 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 Alcohol abuse Father   . Heart attack Father   . Arthritis Mother   . Asthma Mother   . Mental illness Mother   . Thyroid disease Mother   . COPD Mother   .  Heart disease Mother   . Congestive Heart Failure Mother   . Alcohol abuse Mother   . Eating disorder Mother   . Arthritis Brother   . Mental illness Brother   . Cancer Brother        non-hodkins lymphoma  . Alcohol abuse Sister   . Drug abuse Sister   . Mental illness Sister   . Mental illness Sister   . Fibromyalgia Sister   . Obesity Sister   . Pneumonia Sister   . Mental illness Sister   . Alcohol abuse Sister   . Drug abuse Sister   . Diabetes Neg Hx   . Stroke Neg Hx   . Breast cancer Neg Hx     ROS: Review of Systems  Constitutional: Negative for chills.  Psychiatric/Behavioral: Positive for depression. Negative for suicidal ideas.    PHYSICAL EXAM: Pt in no acute distress  RECENT LABS AND TESTS: BMET    Component Value Date/Time   NA 139 07/26/2018 1101   NA 136 04/29/2014 1133   K 4.7 07/26/2018 1101   K 4.2 04/29/2014 1133   CL 100 07/26/2018 1101   CL 103 04/29/2014 1133   CO2 23 07/26/2018 1101   CO2 30 04/29/2014 1133   GLUCOSE 96 07/26/2018 1101   GLUCOSE 80 08/16/2017 0840   GLUCOSE 85 04/29/2014 1133   BUN 13 07/26/2018 1101   BUN 9 04/29/2014 1133   CREATININE 0.97 07/26/2018 1101   CREATININE 0.79 08/16/2017 0840   CALCIUM 9.5 07/26/2018 1101   CALCIUM 9.1 04/29/2014 1133   GFRNONAA 65 07/26/2018 1101   GFRNONAA 84 08/16/2017 0840   GFRAA 75 07/26/2018 1101   GFRAA 97 08/16/2017 0840   Lab Results  Component Value Date   HGBA1C 5.9 (H) 07/26/2018   HGBA1C 5.2 08/16/2017   HGBA1C 5.6 10/14/2016   HGBA1C 5.3 04/12/2016   HGBA1C 6.1 (H) 10/09/2015   Lab Results  Component Value Date   INSULIN 31.0 (H) 07/26/2018   CBC    Component Value Date/Time  WBC 11.7 (H) 07/26/2018 1101   WBC 10.0 08/16/2017 0840   RBC 4.79 07/26/2018 1101   RBC 4.48 08/16/2017 0840   HGB 15.2 07/26/2018 1101   HCT 43.4 07/26/2018 1101   PLT 221 08/16/2017 0840   PLT 241 10/09/2015 0933   MCV 91 07/26/2018 1101   MCV 91 04/29/2014 1133   MCH 31.7  07/26/2018 1101   MCH 31.7 08/16/2017 0840   MCHC 35.0 07/26/2018 1101   MCHC 35.1 08/16/2017 0840   RDW 11.7 07/26/2018 1101   RDW 13.1 04/29/2014 1133   LYMPHSABS 5.1 (H) 07/26/2018 1101   LYMPHSABS 2.3 11/20/2012 1005   MONOABS 390 10/14/2016 0903   MONOABS 0.5 11/20/2012 1005   EOSABS 0.4 07/26/2018 1101   EOSABS 0.2 11/20/2012 1005   BASOSABS 0.1 07/26/2018 1101   BASOSABS 0.1 11/20/2012 1005   Iron/TIBC/Ferritin/ %Sat No results found for: IRON, TIBC, FERRITIN, IRONPCTSAT Lipid Panel     Component Value Date/Time   CHOL 158 07/26/2018 1101   TRIG 134 07/26/2018 1101   HDL 44 07/26/2018 1101   CHOLHDL 3.5 03/06/2018 1144   CHOLHDL 3.5 08/16/2017 0840   VLDL 27 10/14/2016 0903   LDLCALC 87 07/26/2018 1101   LDLCALC 74 08/16/2017 0840   Hepatic Function Panel     Component Value Date/Time   PROT 7.0 07/26/2018 1101   PROT 7.1 03/13/2013 1509   ALBUMIN 4.7 07/26/2018 1101   ALBUMIN 3.5 03/13/2013 1509   AST 19 07/26/2018 1101   AST 90 (H) 03/13/2013 1509   ALT 19 07/26/2018 1101   ALT 96 (H) 03/13/2013 1509   ALKPHOS 80 07/26/2018 1101   ALKPHOS 110 03/13/2013 1509   BILITOT 0.4 07/26/2018 1101   BILITOT 0.6 03/13/2013 1509      Component Value Date/Time   TSH 1.430 07/26/2018 1101   TSH 0.85 08/16/2017 0840   TSH 0.94 10/14/2016 0903     Ref. Range 07/26/2018 11:01  Vitamin D, 25-Hydroxy Latest Ref Range: 30.0 - 100.0 ng/mL 18.9 (L)    I, Doreene Nest, am acting as Location manager for Dennard Nip, MD I have reviewed the above documentation for accuracy and completeness, and I agree with the above. -Dennard Nip, MD

## 2018-12-04 ENCOUNTER — Ambulatory Visit (INDEPENDENT_AMBULATORY_CARE_PROVIDER_SITE_OTHER): Payer: Medicare HMO | Admitting: Psychology

## 2018-12-04 DIAGNOSIS — F3289 Other specified depressive episodes: Secondary | ICD-10-CM

## 2018-12-07 ENCOUNTER — Other Ambulatory Visit (INDEPENDENT_AMBULATORY_CARE_PROVIDER_SITE_OTHER): Payer: Self-pay | Admitting: Family Medicine

## 2018-12-07 DIAGNOSIS — E559 Vitamin D deficiency, unspecified: Secondary | ICD-10-CM

## 2018-12-10 DIAGNOSIS — M48062 Spinal stenosis, lumbar region with neurogenic claudication: Secondary | ICD-10-CM | POA: Diagnosis not present

## 2018-12-10 DIAGNOSIS — M1612 Unilateral primary osteoarthritis, left hip: Secondary | ICD-10-CM | POA: Diagnosis not present

## 2018-12-10 DIAGNOSIS — M5136 Other intervertebral disc degeneration, lumbar region: Secondary | ICD-10-CM | POA: Diagnosis not present

## 2018-12-10 DIAGNOSIS — M5030 Other cervical disc degeneration, unspecified cervical region: Secondary | ICD-10-CM | POA: Diagnosis not present

## 2018-12-10 DIAGNOSIS — M7541 Impingement syndrome of right shoulder: Secondary | ICD-10-CM | POA: Diagnosis not present

## 2018-12-10 DIAGNOSIS — M5416 Radiculopathy, lumbar region: Secondary | ICD-10-CM | POA: Diagnosis not present

## 2018-12-10 DIAGNOSIS — M5412 Radiculopathy, cervical region: Secondary | ICD-10-CM | POA: Diagnosis not present

## 2018-12-11 ENCOUNTER — Ambulatory Visit (INDEPENDENT_AMBULATORY_CARE_PROVIDER_SITE_OTHER): Payer: Medicare HMO | Admitting: Psychology

## 2018-12-11 DIAGNOSIS — F3289 Other specified depressive episodes: Secondary | ICD-10-CM

## 2018-12-12 ENCOUNTER — Encounter (INDEPENDENT_AMBULATORY_CARE_PROVIDER_SITE_OTHER): Payer: Self-pay

## 2018-12-12 NOTE — Progress Notes (Signed)
Office: [phone]  /  Fax: [phone]    Date: December 13, 2018   Appointment Start Time: 3:45pm Duration: 33 minutes Provider: Glennie Isle, Psy.D. Type of Session: Individual Therapy  Location of Patient: Home Location of Provider: [facility] Type of Contact: Telepsychological Visit via Audio Call and [employer] WebEx  Session Content: Susan Susan [name] is a 58 y.o. female presenting for a follow-up appointment to address the previously established treatment goal of decreasing emotional eating. Of note, this provider called Susan Susan [name] at 2:31pm to assist with connecting. A HIPAA compliant voicemail was left requesting a call back. This provider called Susan Susan [name] an additional time at 2:36pm and she answered. She noted she was attempting to connect. The e-mail with the secure link was re-sent. Due to connection issues, the appointment was initiated as an audio call with [facility] consent. Both provider and Susan Susan [name] were utilizing a landline. At 2:54pm, Susan Susan [name] was able to connect via WebEx; therefore, the audio call was ended and the appointment resumed on WebEx. Due to the initial connection issues, today's appointment was initiated 15 minutes late.Today's appointment was a telepsychological visit, as this provider's clinic is seeing a limited number of patients for in-person visits due to COVID-19. Therapeutic services will resume to in-person appointments once deemed appropriate. Susan [name] expressed understanding regarding the rationale for telepsychological services, and provided verbal consent for today's appointment. Prior to proceeding with today's appointment, Susan Susan [name]'s physical location at the time of this appointment was obtained. Susan Susan [name] reported she was at home and provided the address. In the event of technical difficulties, Susan Susan [name] shared a phone number she could be reached at. Susan Susan [name] and this provider participated in today's telepsychological service. Also, Susan Susan [name] denied anyone else being  present in the room or on the call/WebEx appointment.   This provider conducted a brief check-in and verbally administered the PHQ-9 and GAD-7. Susan Susan [name] shared, "I think Susan [name] are going rather well." She discussed speaking with one of her sons and noted her other son was arrested in [place]. She further discussed setting boundaries with her son. She noted, "It feels great!" Susan Susan [name] shared she is setting boundaries with others in her life as well. This was positively reinforced. A risk assessment was completed. Susan Susan [name] denied experiencing suicidal and homicidal ideation, plan, and intent since the last appointment with this provider. She reported she continues to have easy access to the developed safety plan, and continues to acknowledge understanding regarding the importance of reaching out to trusted individuals and/or emergency resources if she is unable to ensure safety. In addition, Susan Susan [name] shared there is no longer concern regarding a relapse and noted she continues to attend AA meetings. Moreover, Susan Susan [name] reported Dr. Leafy [name] increased the dose of Wellbutrin and Susan Susan [name] noted she does not believe it is helping with her eating habits. Susan Susan [name] reported a plan to discuss the concern with Dr. Leafy [name] during their appointment later today. Susan Susan [name] also described experiencing disappointment regarding her weight loss progress. This was explored further. Psychoeducation regarding all or nothing thinking was provided. Notably, Susan Susan [name] shared she is drinking approximately 100oz of coffee daily. Thus, psychoeducation regarding the impact of her increase in caffeine intake on mood, sleep, eating habits, and schedule was provided. It was recommended she discuss the best way to reduce her caffeine intake with Dr. Leafy [name] during their appointment today; Susan Susan [name] agreed. Furthermore, a plan was developed to help Susan Susan [name] cope with emotional eating secondary to stress using learned skills. She wrote down the following plan: focus on  hydration, be prepared with snacks  congruent to the meal plan,  pause to ask questions when triggered to eat (e.g., Am I really hungry?; Is there something bothering me? Will I feel better if I eat?), and engage in pleasurable activities or other coping skills after going through the aforementioned questions. She shared, I think it is pretty good. It was recommended Susan Susan [name] continue discussing her emotional eating habits with her other therapist; she agreed. She shared her last appointment with her other therapist was on Tuesday and she noted, "I really like him." Susan Susan [name] was receptive to today's session as evidenced by openness to sharing, responsiveness to feedback, and willingness to engage in learned skills.  Mental Status Examination:  Appearance: neat Behavior: cooperative Mood: euthymic Affect: mood congruent Speech: normal in rate, volume, and tone Eye Contact: appropriate Psychomotor Activity: appropriate Thought Process: linear, logical, and goal directed  Content/Perceptual Disturbances: denies suicidal and homicidal ideation, plan, and intent and no hallucinations, delusions, bizarre thinking or behavior reported or observed Orientation: time, person, place and purpose of appointment Cognition/Sensorium: memory, attention, language, and fund of knowledge intact  Insight: fair Judgment: fair  Structured Assessment Results: The Patient Health Questionnaire-9 (PHQ-9) is a self-report measure that assesses symptoms and severity of depression over the course of the last two weeks. Susan Susan [name] obtained a score of 4 suggesting minimal depression. Susan Susan [name] finds the endorsed symptoms to be somewhat difficult. Susan Susan [name] 0  Feeling down, depressed, or hopeless 0  Trouble falling or staying asleep, or sleeping too much 1  Feeling tired or having Susan energy 1  Poor appetite or overeating 1  Feeling bad about yourself --- or that you are a failure or have let  yourself or your family down 0  Trouble concentrating on Susan [name], such as reading the newspaper or watching television 0  Moving or speaking so slowly that other people could have noticed? Or the opposite --- being so fidgety or restless that you have been moving around a lot more than usual 1  Thoughts that you would be better off dead or hurting yourself in some way 0  PHQ-9 Score 4    The Generalized Anxiety Disorder-7 (GAD-7) is a brief self-report measure that assesses symptoms of anxiety over the course of the last two weeks. Susan Susan [name] obtained a score of 3 suggesting minimal anxiety. Susan Susan [name] finds the endorsed symptoms to be somewhat difficult. Feeling nervous, anxious, on edge 1  Not being able to stop or control worrying 0  Worrying too much about different Susan [name] 1  Trouble relaxing 1  Being so restless that it's hard to sit still 0  Becoming easily annoyed or irritable 0  Feeling afraid as if something awful might happen 0  GAD-7 Score 3   Interventions:  Conducted a brief chart review Verbal administration of PHQ-9 and GAD-7 for symptom monitoring Provided empathic reflections and validation Reviewed learned skills Provided positive reinforcement Psychoeducation provided regarding all-or-nothing thinking Employed supportive psychotherapy interventions to facilitate reduced distress, and to improve coping skills with identified stressors Psychoeducation provided regarding caffeine use   DSM-5 Diagnoses: 296.31 (F33.0) Major Depressive Disorder, Recurrent Episode, Mild, 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 Celese has demonstrated progress in her goal as evidenced by increased awareness of hunger patterns and  triggers for emotional eating. Despite  deviations from her meal plan and engagement in emotional eating, Ambrea continues to demonstrate willingness to engage in learned skills.   Plan: Today was Alphia's last appointment with this provider. She will continue meeting with her other therapist. Their next appointment is on December 25, 2018.

## 2018-12-13 ENCOUNTER — Ambulatory Visit (INDEPENDENT_AMBULATORY_CARE_PROVIDER_SITE_OTHER): Payer: Medicare HMO | Admitting: Psychology

## 2018-12-13 ENCOUNTER — Other Ambulatory Visit: Payer: Self-pay

## 2018-12-13 ENCOUNTER — Telehealth (INDEPENDENT_AMBULATORY_CARE_PROVIDER_SITE_OTHER): Payer: Medicare HMO | Admitting: Family Medicine

## 2018-12-13 ENCOUNTER — Encounter (INDEPENDENT_AMBULATORY_CARE_PROVIDER_SITE_OTHER): Payer: Self-pay | Admitting: Family Medicine

## 2018-12-13 DIAGNOSIS — E559 Vitamin D deficiency, unspecified: Secondary | ICD-10-CM

## 2018-12-13 DIAGNOSIS — F1421 Cocaine dependence, in remission: Secondary | ICD-10-CM | POA: Diagnosis not present

## 2018-12-13 DIAGNOSIS — F1021 Alcohol dependence, in remission: Secondary | ICD-10-CM

## 2018-12-13 DIAGNOSIS — Z6841 Body Mass Index (BMI) 40.0 and over, adult: Secondary | ICD-10-CM

## 2018-12-13 DIAGNOSIS — F330 Major depressive disorder, recurrent, mild: Secondary | ICD-10-CM

## 2018-12-17 ENCOUNTER — Emergency Department
Admission: EM | Admit: 2018-12-17 | Discharge: 2018-12-17 | Disposition: A | Discharge status: Home / Self Care | Payer: Medicare HMO | Attending: Student in an Organized Health Care Education/Training Program | Admitting: Student in an Organized Health Care Education/Training Program

## 2018-12-17 ENCOUNTER — Other Ambulatory Visit: Payer: Self-pay

## 2018-12-17 ENCOUNTER — Emergency Department: Payer: Medicare HMO

## 2018-12-17 ENCOUNTER — Encounter: Payer: Self-pay | Admitting: Emergency Medicine

## 2018-12-17 DIAGNOSIS — Z96651 Presence of right artificial knee joint: Secondary | ICD-10-CM | POA: Insufficient documentation

## 2018-12-17 DIAGNOSIS — R079 Chest pain, unspecified: Secondary | ICD-10-CM | POA: Diagnosis not present

## 2018-12-17 DIAGNOSIS — F17210 Nicotine dependence, cigarettes, uncomplicated: Secondary | ICD-10-CM | POA: Diagnosis not present

## 2018-12-17 DIAGNOSIS — E039 Hypothyroidism, unspecified: Secondary | ICD-10-CM | POA: Diagnosis not present

## 2018-12-17 DIAGNOSIS — I10 Essential (primary) hypertension: Secondary | ICD-10-CM | POA: Insufficient documentation

## 2018-12-17 DIAGNOSIS — J449 Chronic obstructive pulmonary disease, unspecified: Secondary | ICD-10-CM | POA: Insufficient documentation

## 2018-12-17 DIAGNOSIS — R0789 Other chest pain: Secondary | ICD-10-CM | POA: Diagnosis not present

## 2018-12-17 DIAGNOSIS — Z79899 Other long term (current) drug therapy: Secondary | ICD-10-CM | POA: Diagnosis not present

## 2018-12-17 DIAGNOSIS — Z20828 Contact with and (suspected) exposure to other viral communicable diseases: Secondary | ICD-10-CM | POA: Insufficient documentation

## 2018-12-17 DIAGNOSIS — M069 Rheumatoid arthritis, unspecified: Secondary | ICD-10-CM | POA: Diagnosis not present

## 2018-12-17 LAB — BASIC METABOLIC PANEL
Anion gap: 9 (ref 5–15)
BUN: 17 mg/dL (ref 6–20)
CO2: 23 mmol/L (ref 22–32)
Calcium: 8.7 mg/dL — ABNORMAL LOW (ref 8.9–10.3)
Chloride: 97 mmol/L — ABNORMAL LOW (ref 98–111)
Creatinine, Ser: 0.89 mg/dL (ref 0.44–1.00)
GFR calc Af Amer: >60 mL/min (ref >60)
GFR calc non Af Amer: >60 mL/min (ref >60)
Glucose, Bld: 133 mg/dL — ABNORMAL HIGH (ref 70–99)
Potassium: 4.7 mmol/L (ref 3.5–5.1)
Sodium: 129 mmol/L — ABNORMAL LOW (ref 135–145)

## 2018-12-17 LAB — CBC
HCT: 40.7 % (ref 36.0–46.0)
Hemoglobin: 13.8 g/dL (ref 12.0–15.0)
MCH: 31.6 pg (ref 26.0–34.0)
MCHC: 33.9 g/dL (ref 30.0–36.0)
MCV: 93.1 fL (ref 80.0–100.0)
Platelets: 247 10*3/uL (ref 150–400)
RBC: 4.37 MIL/uL (ref 3.87–5.11)
RDW: 12.9 % (ref 11.5–15.5)
WBC: 18.1 10*3/uL — ABNORMAL HIGH (ref 4.0–10.5)
nRBC: 0 % (ref 0.0–0.2)

## 2018-12-17 LAB — HEPATIC FUNCTION PANEL
ALT: 19 U/L (ref 0–44)
AST: 18 U/L (ref 15–41)
Albumin: 4 g/dL (ref 3.5–5.0)
Alkaline Phosphatase: 85 U/L (ref 38–126)
Bilirubin, Direct: <0.1 mg/dL (ref 0.0–0.2)
Total Bilirubin: 0.7 mg/dL (ref 0.3–1.2)
Total Protein: 6.6 g/dL (ref 6.5–8.1)

## 2018-12-17 LAB — TROPONIN I (HIGH SENSITIVITY)
Troponin I (High Sensitivity): 3 ng/L (ref <18)
Troponin I (High Sensitivity): 3 ng/L (ref <18)

## 2018-12-17 LAB — FIBRIN DERIVATIVES D-DIMER (ARMC ONLY): Fibrin derivatives D-dimer (ARMC): 399.86 ng{FEU}/mL (ref 0.00–499.00)

## 2018-12-17 LAB — SARS CORONAVIRUS 2 BY RT PCR (HOSPITAL ORDER, PERFORMED IN ~~LOC~~ HOSPITAL LAB): SARS Coronavirus 2: NEGATIVE

## 2018-12-17 MED ORDER — ACETAMINOPHEN 500 MG PO TABS
1000.0000 mg | ORAL_TABLET | Freq: Once | ORAL | Status: AC
  Administered 2018-12-17: 1000 mg via ORAL
  Filled 2018-12-17: qty 2

## 2018-12-17 NOTE — ED Provider Notes (Signed)
[facility] Emergency Department Provider Note    First MD Initiated Contact with Patient 12/17/18 1528     (approximate)  I have reviewed the triage vital signs and the nursing notes.   HISTORY  Chief Complaint Chest Pain and Shortness of Breath    HPI Susan [name] is a 58 y.o. female the below listed past medical history presents for evaluation of left-sided chest discomfort.  States that she was moving 1 yesterday and that she may have pulled a muscle but the pain is been persistent and she does have some discomfort when taking deep inspiration.  Denies any measured fevers.  Denies any cough.  No nausea vomiting or diarrhea.  No lower extremity swelling.    Past Medical History:  Diagnosis Date  . ADD (attention deficit disorder)   . Alcohol abuse   . Anemia   . Anxiety   . Aortic atherosclerosis ([place]) 02/20/2018   Chest CT Sept 2019  . Arthritis    rheumatoid arthritis  . Asthma   . Bipolar disorder ([place])   . Centrilobular emphysema ([place]) 02/20/2018   Chest CT Sept 2019  . Constipation   . Degenerative disc disease at L5-S1 level 09/28/2016   See ortho note May 2018  . Depression    bipolar, hx of suicide attempt  . Drug use   . Dyspnea    with exertion  . GERD (gastroesophageal reflux disease)   . H/O suicide attempt    slit wrists  . Hip pain   . History of alcohol abuse   . History of diverticulitis 2013  . History of hepatitis C    HEP "C"--three years ago  . History of MRSA infection 2013  . HLD (hyperlipidemia)   . Hypertension   . Hypothyroidism   . Knee pain   . 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 Multinodular thyroid   . OSA (obstructive sleep apnea)    managed by Dr. Manuella Ghazi  . Osteoporosis   . Post-traumatic osteoarthritis of right knee 09/24/2015  . Prediabetes   . Sleep apnea     Dx 3 years ago. Use C-PAP  . Thyroid disease    Goiter  . Vitamin B12 deficiency   . Vitamin D deficiency disease    Family History   Problem Relation Age of Onset  . Alcohol abuse Father   . Heart attack Father   . Arthritis Mother   . Asthma Mother   . Mental illness Mother   . Thyroid disease Mother   . COPD Mother   . Heart disease Mother   . Congestive Heart Failure Mother   . Alcohol abuse Mother   . Eating disorder Mother   . Arthritis Brother   . Mental illness Brother   . Cancer Brother        non-hodkins lymphoma  . Alcohol abuse Sister   . Drug abuse Sister   . Mental illness Sister   . Mental illness Sister   . Fibromyalgia Sister   . Obesity Sister   . Pneumonia Sister   . Mental illness Sister   . Alcohol abuse Sister   . Drug abuse Sister   . Diabetes Neg Hx   . Stroke Neg Hx   . Breast cancer Neg Hx    Past Surgical History:  Procedure Laterality Date  . BILATERAL SALPINGOOPHORECTOMY     due to abnormal mass  . BREAST BIOPSY Left    neg  . BREAST SURGERY Left 20  yrs ago  . CESAREAN SECTION    . COLONOSCOPY    . COLONOSCOPY WITH PROPOFOL N/A 10/16/2017   Procedure: COLONOSCOPY WITH PROPOFOL;  Surgeon: Jonathon Bellows, MD;  Location: [facility] ENDOSCOPY;  Service: Gastroenterology;  Laterality: N/A;  . HERNIA REPAIR  06/2011, July 2014   Ventral wall repair with Physiomesh  . TONSILLECTOMY    . TOTAL KNEE ARTHROPLASTY Right 05/10/2016   Procedure: TOTAL KNEE ARTHROPLASTY;  Surgeon: Corky Mull, MD;  Location: [facility] ORS;  Service: Orthopedics;  Laterality: Right;  . TUBAL LIGATION     Patient Active Problem List   Diagnosis Date Noted  . Depression 10/23/2018  . Class 3 severe obesity with serious comorbidity and body mass index (BMI) of 40.0 to 44.9 in adult ([place]) 10/23/2018  . Hyperinsulinemia 08/23/2018  . Allergic rhinitis 08/23/2018  . Aortic atherosclerosis ([place]) 02/20/2018  . Centrilobular emphysema ([place]) 02/20/2018  . Encounter for screening for HIV 05/16/2017  . Dyslipidemia 03/28/2017  . Cocaine use disorder ([place]) 02/27/2017  . Low HDL (under 40) 10/14/2016  . Degenerative  disc disease at L5-S1 level 09/28/2016  . 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 Tobacco abuse 04/21/2016  . Cracked skin on feet 01/19/2016  . Diastasis recti 12/18/2015  . GERD (gastroesophageal reflux disease) 12/17/2015  . Chronic back pain 10/09/2015  . Post-traumatic osteoarthritis of right knee 09/24/2015  . Morbid obesity ([place]) 08/06/2015  . Pain in right knee 08/03/2015  . Postmenopausal 07/08/2015  . Cervical cancer screening 07/08/2015  . Alcoholism in recovery ([place]) 04/13/2015  . Impaired fasting glucose 04/10/2015  . Need for Streptococcus pneumoniae and influenza vaccination 04/09/2015  . Vitamin D deficiency 04/09/2015  . Vitamin B12 deficiency 04/09/2015  . Medication monitoring encounter 04/09/2015  . OSA on CPAP 04/09/2015  . Breast cancer screening 04/09/2015  . Subclinical hyperthyroidism 06/26/2014  . Toxic multinodular goiter 06/26/2014  . Hypertension 06/26/2014  . Hepatitis C 06/26/2014  . Bipolar disorder ([place]) 06/26/2014  . Throat dryness 05/20/2014  . Lumbar radiculitis 10/03/2013  . Diverticulosis 08/24/2013  . Recurrent ventral hernia 11/08/2012  . Incisional hernia 11/08/2012      Prior to Admission medications   Medication Sig Start Date End Date Taking? Authorizing Provider  albuterol (PROVENTIL HFA;VENTOLIN HFA) 108 (90 Base) MCG/ACT inhaler INHALE 1 TO 2 PUFFS EVERY 4 HOURS AS NEEDED FOR COUGH, WHEEZING, AND SHORTNESS OF BREATH 07/04/18   Poulose, Bethel Born, NP  amLODipine (NORVASC) 5 MG tablet TAKE 1 TABLET (5 MG TOTAL) BY MOUTH DAILY. 10/11/18   Hubbard Hartshorn, FNP  atenolol (TENORMIN) 25 MG tablet TAKE 1 TABLET EVERY DAY 10/11/18   Hubbard Hartshorn, FNP  atorvastatin (LIPITOR) 10 MG tablet TAKE 1 TABLET (10 MG TOTAL) BY MOUTH AT BEDTIME. 10/11/18   Hubbard Hartshorn, FNP  buPROPion [facility] SR) 200 MG 12 hr tablet Take 1 tablet (200 mg total) by mouth daily. 11/29/18   Dennard Nip D, MD  cholecalciferol (VITAMIN D3)  25 MCG (1000 UT) tablet Take 1,000 Units by mouth daily.    [provider]  clotrimazole (LOTRIMIN) 1 % cream Apply 1 application topically 2 (two) times daily. Patient taking differently: Apply 1 application topically as needed.  05/16/17   Arnetha Courser, MD  cyclobenzaprine (FLEXERIL) 5 MG tablet Take 5 mg by mouth at bedtime.  06/04/15   [provider]  DULoxetine (CYMBALTA) 60 MG capsule TAKE 1 CAPSULE (60 MG TOTAL) BY MOUTH 2 (TWO) TIMES DAILY. 10/09/18  Poulose, Bethel Born, NP  gabapentin (NEURONTIN) 300 MG capsule Take 600 mg by mouth 2 (two) times daily.     [provider]  HYDROcodone-acetaminophen (NORCO/VICODIN) 5-325 MG tablet One-half to one pill by mouth daily PRN ([facility]) 11/03/16   [provider]  loratadine (CLARITIN) 10 MG tablet Take 1 tablet (10 mg total) by mouth daily as needed for allergies. 08/23/18   Arnetha Courser, MD  losartan (COZAAR) 100 MG tablet TAKE 1 TABLET EVERY DAY 10/11/18   Hubbard Hartshorn, FNP  tiotropium (SPIRIVA HANDIHALER) 18 MCG inhalation capsule INHALE THE CONTENTS OF 1 CAPSULE ONE TIME DAILY 08/07/18   Poulose, Bethel Born, NP  vitamin B-12 (CYANOCOBALAMIN) 1000 MCG tablet Take 1,000 mcg by mouth daily.    [provider]  Vitamin D, Ergocalciferol, (DRISDOL) 1.25 MG (50000 UT) CAPS capsule Take 1 capsule (50,000 Units total) by mouth every 7 (seven) days. 11/15/18   Starlyn Skeans, MD    Allergies Hydrochlorothiazide and Lasix [furosemide]    Social History Social History   Tobacco Use  . Smoking status: Current Every Day Smoker    Packs/day: 0.50    Years: 41.00    Pack years: 20.50    Types: Cigarettes  . Smokeless tobacco: Never Used  Substance Use Topics  . Alcohol use: No    Alcohol/week: 0.0 standard drinks    Comment: sober since October 2018  . Drug use: No    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    Review of Systems Patient denies headaches, rhinorrhea, blurry vision,  numbness, shortness of breath, chest pain, edema, cough, abdominal pain, nausea, vomiting, diarrhea, dysuria, fevers, rashes or hallucinations unless otherwise stated above in HPI. ____________________________________________   PHYSICAL EXAM:  VITAL SIGNS: Vitals:   12/17/18 1620 12/17/18 1850  BP: 127/71 125/78  Pulse: 63 65  Resp: 20 18  Temp:    SpO2: 97% 97%    Constitutional: Alert and oriented.  Eyes: Conjunctivae are normal.  Head: Atraumatic. Nose: No congestion/rhinnorhea. Mouth/Throat: Mucous membranes are moist.   Neck: No stridor. Painless ROM.  Cardiovascular: Normal rate, regular rhythm. Grossly normal heart sounds.  Good peripheral circulation. Respiratory: Normal respiratory effort.  No retractions. Lungs CTAB. Gastrointestinal: Soft and nontender. No distention. No abdominal bruits. No CVA tenderness. Genitourinary:  Musculoskeletal:  ttp of lateral chest wall without crepitus of ecchymosis. No lower extremity tenderness nor edema.  No joint effusions. Neurologic:  Normal speech and language. No gross focal neurologic deficits are appreciated. No facial droop Skin:  Skin is warm, dry and intact. No rash noted. Psychiatric: Mood and affect are normal. Speech and behavior are normal.  ____________________________________________   LABS (all labs ordered are listed, but only abnormal results are displayed)  Results for orders placed or performed during the hospital encounter of 12/17/18 (from the past 24 hour(s))  Basic metabolic panel     Status: Abnormal   Collection Time: 12/17/18  2:57 PM  Result Value Ref Range   Sodium 129 (L) 135 - 145 mmol/L   Potassium 4.7 3.5 - 5.1 mmol/L   Chloride 97 (L) 98 - 111 mmol/L   CO2 23 22 - 32 mmol/L   Glucose, Bld 133 (H) 70 - 99 mg/dL   BUN 17 6 - 20 mg/dL   Creatinine, Ser 0.89 0.44 - 1.00 mg/dL   Calcium 8.7 (L) 8.9 - 10.3 mg/dL   GFR calc non Af Amer >60 >60 mL/min   GFR calc Af Amer >60 >  60 mL/min   Anion gap  9 5 - 15  CBC     Status: Abnormal   Collection Time: 12/17/18  2:57 PM  Result Value Ref Range   WBC 18.1 (H) 4.0 - 10.5 K/uL   RBC 4.37 3.87 - 5.11 MIL/uL   Hemoglobin 13.8 12.0 - 15.0 g/dL   HCT 40.7 36.0 - 46.0 %   MCV 93.1 80.0 - 100.0 fL   MCH 31.6 26.0 - 34.0 pg   MCHC 33.9 30.0 - 36.0 g/dL   RDW 12.9 11.5 - 15.5 %   Platelets 247 150 - 400 K/uL   nRBC 0.0 0.0 - 0.2 %  Troponin I (High Sensitivity)     Status: None   Collection Time: 12/17/18  2:57 PM  Result Value Ref Range   Troponin I (High Sensitivity) 3 <18 ng/L  Hepatic function panel     Status: None   Collection Time: 12/17/18  2:57 PM  Result Value Ref Range   Total Protein 6.6 6.5 - 8.1 g/dL   Albumin 4.0 3.5 - 5.0 g/dL   AST 18 15 - 41 U/L   ALT 19 0 - 44 U/L   Alkaline Phosphatase 85 38 - 126 U/L   Total Bilirubin 0.7 0.3 - 1.2 mg/dL   Bilirubin, Direct <0.1 0.0 - 0.2 mg/dL   Indirect Bilirubin NOT CALCULATED 0.3 - 0.9 mg/dL  SARS Coronavirus 2 [facility] order, Performed in [place] hospital lab) Nasopharyngeal Nasopharyngeal Swab     Status: None   Collection Time: 12/17/18  3:38 PM   Specimen: Nasopharyngeal Swab  Result Value Ref Range   SARS Coronavirus 2 NEGATIVE NEGATIVE  Fibrin derivatives D-Dimer ([facility] only)     Status: None   Collection Time: 12/17/18  3:52 PM  Result Value Ref Range   Fibrin derivatives D-dimer ([facility]) 399.86 0.00 - 499.00 ng/mL (FEU)  Troponin I (High Sensitivity)     Status: None   Collection Time: 12/17/18  5:55 PM  Result Value Ref Range   Troponin I (High Sensitivity) 3 <18 ng/L   ____________________________________________  EKG My review and personal interpretation at Time: 14:46   Indication: chest pain  Rate: 70  Rhythm: sinus Axis: normal  Other: normal intervals, no stemi ____________________________________________  RADIOLOGY  I personally reviewed all radiographic images ordered to evaluate for the above acute complaints and reviewed radiology reports  and findings.  These findings were personally discussed with the patient.  Please see medical record for radiology report.  ____________________________________________   PROCEDURES  Procedure(s) performed:  Procedures    Critical Care performed: no ____________________________________________   INITIAL IMPRESSION / ASSESSMENT AND PLAN / ED COURSE  Pertinent labs & imaging results that were available during my care of the patient were reviewed by me and considered in my medical decision making (see chart for details).   DDX: ACS, pericarditis, esophagitis, boerhaaves, pe, dissection, pna, bronchitis, costochondritis   Shaniqua Guillot Foor is a 58 y.o. who presents to the ED with left lateral chest pain as described above.  She is nontoxic-appearing it seems likely musculoskeletal but given her age and risk factors will evaluate for ACS PE and the above differential.  We have sent Tylenol patient is declining any additional pain medication at this time.  Donnell exam soft and benign.  Clinical Course as of Dec 17 1911  Mon Dec 17, 2018  1706 D-dimer is negative.  Mild leukocytosis likely secondary to recent steroid injection.  Will repeat troponin but have  a lower suspicion for ACS.  Probable musculoskeletal strain.   [PR]  1839 Repeat troponin is negative.  Have a high suspicion for musculoskeletal strain.  She is declines any pain medication at this time.  Will give referral to PCP as well as cardiology.  Discussed signs and symptoms for which she should return to the ER.  Have discussed with the patient and available family all diagnostics and treatments performed thus far and all questions were answered to the best of my ability. The patient demonstrates understanding and agreement with plan.    [PR]    Clinical Course User Index [PR] Merlyn Lot, MD    The patient was evaluated in Emergency Department today for the symptoms described in the history of present illness.  He/she was evaluated in the context of the global COVID-19 pandemic, which necessitated consideration that the patient might be at risk for infection with the SARS-CoV-2 virus that causes COVID-19. Institutional protocols and algorithms that pertain to the evaluation of patients at risk for COVID-19 are in a state of rapid change based on information released by regulatory bodies including the CDC and federal and state organizations. These policies and algorithms were followed during the patient's care in the ED.  As part of my medical decision making, I reviewed the following data within the [place] notes reviewed and incorporated, Labs reviewed, notes from prior ED visits and [place] Controlled Substance Database   ____________________________________________   FINAL CLINICAL IMPRESSION(S) / ED DIAGNOSES  Final diagnoses:  Chest pain, unspecified type      NEW MEDICATIONS STARTED DURING THIS VISIT:  New Prescriptions   No medications on file     Note:  This document was prepared using Dragon voice recognition software and may include unintentional dictation errors.    Merlyn Lot, MD 12/17/18 [phone]

## 2018-12-17 NOTE — ED Notes (Signed)
Esign not working at this time. Pt verbalized discharge instructions

## 2018-12-17 NOTE — ED Triage Notes (Signed)
Pt reports CP since Saturday that is heavy in nature and constant. Pt states it eased up after she took 2 baby aspirin but she still feels uncomfortable. Pt reports intermittent SOB. Pt denies fever, pain that radiates or other symptoms.

## 2018-12-17 NOTE — Progress Notes (Signed)
Office: [phone]  /  Fax: [phone] TeleHealth Visit:  Susan [name] has verbally consented to this TeleHealth visit today. The patient is located at home, the provider is located at the [facility] office. The participants in this visit include the listed provider and patient and any and all parties involved. The visit was conducted today via telephone. Susan [name] was unable to use realtime audiovisual technology today and the telehealth visit was conducted via telephone.  HPI:   Chief Complaint: OBESITY Susan [name] is here to discuss her progress with her obesity treatment plan. She is on the lower carbohydrate, vegetable and lean protein rich diet plan and is following her eating plan approximately 40 % of the time. She states she is doing yard work for exercise. Susan [name] states she is doing well maintaining her weight. She is trying to follow the lower carb plan, but she is not following it closely enough to cause weight loss. She especially misses fruit. We were unable to weigh the patient today for this TeleHealth visit. She feels as if she has maintained weight since her last visit (weight not reported today). She has lost 0 lbs since starting treatment with [name].  Vitamin D deficiency Susan [name] has a diagnosis of vitamin D deficiency. She is stable on vit D, but she is not yet at goal. Susan [name] denies nausea, vomiting or muscle weakness.  ETOH use disorder in remission Susan [name] continues to do well avoiding alcohol and she regularly goes to [name] (alcoholics anonymous). She has substituted other addictive behaviors instead, and she is working on decreasing 100 ounces of coffee daily, and she is trying to decrease binge eating.  ASSESSMENT AND PLAN:  Vitamin D deficiency - Plan: Vitamin D, Ergocalciferol, (DRISDOL) 1.25 MG (50000 UT) CAPS capsule  Alcohol use disorder, severe, in sustained remission (HCC)  Class 3 severe obesity with serious comorbidity and body mass  index (BMI) of 40.0 to 44.9 in adult, unspecified obesity type ([place])  PLAN:  Vitamin D Deficiency Susan [name] was informed that low vitamin D levels contributes to fatigue and are associated with obesity, breast, and colon cancer. She agrees to continue to take prescription Vit D @50 ,000 IU every week #4 with no refills and she will follow up for routine testing of vitamin D, at least 2-3 times per year. She was informed of the risk of over-replacement of vitamin D and agrees to not increase her dose unless she discusses this with [name] first. Susan [name] agrees to follow up with our clinic in 2 weeks.  ETOH use disorder in remission Susan [name] is encouraged to continue therapy with Dr. Mallie Mussel and to watch for signs of replacing one addiction with another. We will continue to monitor and Susan [name] will follow up at the agreed upon time.  I spent > than 50% of the 25 minute visit on counseling as documented in the note.  Obesity Susan [name] is currently in the action stage of change. As such, her goal is to continue with weight loss efforts She has agreed to change to the Paleo eating plan with 2 to 3 servings of fruit daily Susan [name] has been instructed to work up to a goal of 150 minutes of combined cardio and strengthening exercise per week for weight loss and overall health benefits. We discussed the following Behavioral Modification Strategies today: keeping healthy foods in the home, work on meal planning and easy cooking plans and emotional eating strategies  Susan [name] has agreed to follow up with our clinic in 2 weeks.  She was informed of the importance of frequent follow up visits to maximize her success with intensive lifestyle modifications for her multiple health conditions.  ALLERGIES: Allergies  Allergen Reactions  . Hydrochlorothiazide Other (See Comments)    Electrolyte imbalance  . Lasix [Furosemide]     Electrolyte imbalance    MEDICATIONS: Current Outpatient Medications on File Prior to Visit   Medication Sig Dispense Refill  . albuterol (PROVENTIL HFA;VENTOLIN HFA) 108 (90 Base) MCG/ACT inhaler INHALE 1 TO 2 PUFFS EVERY 4 HOURS AS NEEDED FOR COUGH, WHEEZING, AND SHORTNESS OF BREATH 18 g 2  . amLODipine (NORVASC) 5 MG tablet TAKE 1 TABLET (5 MG TOTAL) BY MOUTH DAILY. 90 tablet 1  . atenolol (TENORMIN) 25 MG tablet TAKE 1 TABLET EVERY DAY 90 tablet 1  . atorvastatin (LIPITOR) 10 MG tablet TAKE 1 TABLET (10 MG TOTAL) BY MOUTH AT BEDTIME. 90 tablet 1  . buPROPion (WELLBUTRIN SR) 200 MG 12 hr tablet Take 1 tablet (200 mg total) by mouth daily. 30 tablet 0  . cholecalciferol (VITAMIN D3) 25 MCG (1000 UT) tablet Take 1,000 Units by mouth daily.    . clotrimazole (LOTRIMIN) 1 % cream Apply 1 application topically 2 (two) times daily. (Patient taking differently: Apply 1 application topically as needed. ) 30 g 0  . cyclobenzaprine (FLEXERIL) 5 MG tablet Take 5 mg by mouth at bedtime.     . DULoxetine (CYMBALTA) 60 MG capsule TAKE 1 CAPSULE (60 MG TOTAL) BY MOUTH 2 (TWO) TIMES DAILY. 180 capsule 1  . gabapentin (NEURONTIN) 300 MG capsule Take 600 mg by mouth 2 (two) times daily.     Marland Kitchen HYDROcodone-acetaminophen (NORCO/VICODIN) 5-325 MG tablet One-half to one pill by mouth daily PRN ([facility])    . loratadine (CLARITIN) 10 MG tablet Take 1 tablet (10 mg total) by mouth daily as needed for allergies. 90 tablet 3  . losartan (COZAAR) 100 MG tablet TAKE 1 TABLET EVERY DAY 90 tablet 1  . tiotropium (SPIRIVA HANDIHALER) 18 MCG inhalation capsule INHALE THE CONTENTS OF 1 CAPSULE ONE TIME DAILY 90 capsule 1  . vitamin B-12 (CYANOCOBALAMIN) 1000 MCG tablet Take 1,000 mcg by mouth daily.    . Vitamin D, Ergocalciferol, (DRISDOL) 1.25 MG (50000 UT) CAPS capsule Take 1 capsule (50,000 Units total) by mouth every 7 (seven) days. 4 capsule 0   No current facility-administered medications on file prior to visit.     PAST MEDICAL HISTORY: Past Medical History:  Diagnosis Date  . ADD (attention deficit disorder)    . Alcohol abuse   . Anemia   . Anxiety   . Aortic atherosclerosis ([place]) 02/20/2018   Chest CT Sept 2019  . Arthritis    rheumatoid arthritis  . Asthma   . Bipolar disorder ([place])   . Centrilobular emphysema ([place]) 02/20/2018   Chest CT Sept 2019  . Constipation   . Degenerative disc disease at L5-S1 level 09/28/2016   See ortho note May 2018  . Depression    bipolar, hx of suicide attempt  . Drug use   . Dyspnea    with exertion  . GERD (gastroesophageal reflux disease)   . H/O suicide attempt    slit wrists  . Hip pain   . History of alcohol abuse   . History of diverticulitis 2013  . History of hepatitis C    HEP "C"--three years ago  . History of MRSA infection 2013  . HLD (hyperlipidemia)   . Hypertension   . Hypothyroidism   .  Knee pain   . 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 Multinodular thyroid   . OSA (obstructive sleep apnea)    managed by Dr. Manuella Ghazi  . Osteoporosis   . Post-traumatic osteoarthritis of right knee 09/24/2015  . Prediabetes   . Sleep apnea     Dx 3 years ago. Use C-PAP  . Thyroid disease    Goiter  . Vitamin B12 deficiency   . Vitamin D deficiency disease     PAST SURGICAL HISTORY: Past Surgical History:  Procedure Laterality Date  . BILATERAL SALPINGOOPHORECTOMY     due to abnormal mass  . BREAST BIOPSY Left    neg  . BREAST SURGERY Left 20 yrs ago  . CESAREAN SECTION    . COLONOSCOPY    . COLONOSCOPY WITH PROPOFOL N/A 10/16/2017   Procedure: COLONOSCOPY WITH PROPOFOL;  Surgeon: Jonathon Bellows, MD;  Location: [facility] ENDOSCOPY;  Service: Gastroenterology;  Laterality: N/A;  . HERNIA REPAIR  06/2011, July 2014   Ventral wall repair with Physiomesh  . TONSILLECTOMY    . TOTAL KNEE ARTHROPLASTY Right 05/10/2016   Procedure: TOTAL KNEE ARTHROPLASTY;  Surgeon: Corky Mull, MD;  Location: [facility] ORS;  Service: Orthopedics;  Laterality: Right;  . TUBAL LIGATION      SOCIAL HISTORY: Social History   Tobacco Use  . Smoking status: Current Every Day  Smoker    Packs/day: 0.50    Years: 41.00    Pack years: 20.50    Types: Cigarettes  . Smokeless tobacco: Never Used  Substance Use Topics  . Alcohol use: No    Alcohol/week: 0.0 standard drinks    Comment: sober since October 2018  . 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 Alcohol abuse Father   . Heart attack Father   . Arthritis Mother   . Asthma Mother   . Mental illness Mother   . Thyroid disease Mother   . COPD Mother   . Heart disease Mother   . Congestive Heart Failure Mother   . Alcohol abuse Mother   . Eating disorder Mother   . Arthritis Brother   . Mental illness Brother   . Cancer Brother        non-hodkins lymphoma  . Alcohol abuse Sister   . Drug abuse Sister   . Mental illness Sister   . Mental illness Sister   . Fibromyalgia Sister   . Obesity Sister   . Pneumonia Sister   . Mental illness Sister   . Alcohol abuse Sister   . Drug abuse Sister   . Diabetes Neg Hx   . Stroke Neg Hx   . Breast cancer Neg Hx     ROS: Review of Systems  Constitutional: Negative for weight loss.  Gastrointestinal: Negative for nausea and vomiting.  Musculoskeletal:       Negative for muscle weakness    PHYSICAL EXAM: Pt in no acute distress  RECENT LABS AND TESTS: BMET    Component Value Date/Time   NA 129 (L) 12/17/2018 1457   NA 139 07/26/2018 1101   NA 136 04/29/2014 1133   K 4.7 12/17/2018 1457   K 4.2 04/29/2014 1133   CL 97 (L) 12/17/2018 1457   CL 103 04/29/2014 1133   CO2 23 12/17/2018 1457   CO2 30 04/29/2014 1133   GLUCOSE 133 (H) 12/17/2018 1457   GLUCOSE 85 04/29/2014  1133   BUN 17 12/17/2018 1457   BUN 13 07/26/2018 1101   BUN 9 04/29/2014 1133   CREATININE 0.89 12/17/2018 1457   CREATININE 0.79 08/16/2017 0840   CALCIUM 8.7 (L) 12/17/2018 1457   CALCIUM 9.1 04/29/2014 1133   GFRNONAA >60 12/17/2018 1457   GFRNONAA 84 08/16/2017 0840    GFRAA >60 12/17/2018 1457   GFRAA 97 08/16/2017 0840   Lab Results  Component Value Date   HGBA1C 5.9 (H) 07/26/2018   HGBA1C 5.2 08/16/2017   HGBA1C 5.6 10/14/2016   HGBA1C 5.3 04/12/2016   HGBA1C 6.1 (H) 10/09/2015   Lab Results  Component Value Date   INSULIN 31.0 (H) 07/26/2018   CBC    Component Value Date/Time   WBC 18.1 (H) 12/17/2018 1457   RBC 4.37 12/17/2018 1457   HGB 13.8 12/17/2018 1457   HGB 15.2 07/26/2018 1101   HCT 40.7 12/17/2018 1457   HCT 43.4 07/26/2018 1101   PLT 247 12/17/2018 1457   PLT 241 10/09/2015 0933   MCV 93.1 12/17/2018 1457   MCV 91 07/26/2018 1101   MCV 91 04/29/2014 1133   MCH 31.6 12/17/2018 1457   MCHC 33.9 12/17/2018 1457   RDW 12.9 12/17/2018 1457   RDW 11.7 07/26/2018 1101   RDW 13.1 04/29/2014 1133   LYMPHSABS 5.1 (H) 07/26/2018 1101   LYMPHSABS 2.3 11/20/2012 1005   MONOABS 390 10/14/2016 0903   MONOABS 0.5 11/20/2012 1005   EOSABS 0.4 07/26/2018 1101   EOSABS 0.2 11/20/2012 1005   BASOSABS 0.1 07/26/2018 1101   BASOSABS 0.1 11/20/2012 1005   Iron/TIBC/Ferritin/ %Sat No results found for: IRON, TIBC, FERRITIN, IRONPCTSAT Lipid Panel     Component Value Date/Time   CHOL 158 07/26/2018 1101   TRIG 134 07/26/2018 1101   HDL 44 07/26/2018 1101   CHOLHDL 3.5 03/06/2018 1144   CHOLHDL 3.5 08/16/2017 0840   VLDL 27 10/14/2016 0903   LDLCALC 87 07/26/2018 1101   LDLCALC 74 08/16/2017 0840   Hepatic Function Panel     Component Value Date/Time   PROT 6.6 12/17/2018 1457   PROT 7.0 07/26/2018 1101   PROT 7.1 03/13/2013 1509   ALBUMIN 4.0 12/17/2018 1457   ALBUMIN 4.7 07/26/2018 1101   ALBUMIN 3.5 03/13/2013 1509   AST 18 12/17/2018 1457   AST 90 (H) 03/13/2013 1509   ALT 19 12/17/2018 1457   ALT 96 (H) 03/13/2013 1509   ALKPHOS 85 12/17/2018 1457   ALKPHOS 110 03/13/2013 1509   BILITOT 0.7 12/17/2018 1457   BILITOT 0.4 07/26/2018 1101   BILITOT 0.6 03/13/2013 1509   BILIDIR <0.1 12/17/2018 1457   IBILI NOT  CALCULATED 12/17/2018 1457      Component Value Date/Time   TSH 1.430 07/26/2018 1101   TSH 0.85 08/16/2017 0840   TSH 0.94 10/14/2016 0903     Ref. Range 07/26/2018 11:01  Vitamin D, 25-Hydroxy Latest Ref Range: 30.0 - 100.0 ng/mL 18.9 (L)    I, Doreene Nest, am acting as Location manager for Dennard Nip, MD I have reviewed the above documentation for accuracy and completeness, and I agree with the above. -Dennard Nip, MD

## 2018-12-25 ENCOUNTER — Ambulatory Visit (INDEPENDENT_AMBULATORY_CARE_PROVIDER_SITE_OTHER): Payer: Medicare HMO | Admitting: Psychology

## 2018-12-25 DIAGNOSIS — F3289 Other specified depressive episodes: Secondary | ICD-10-CM | POA: Diagnosis not present

## 2018-12-27 ENCOUNTER — Other Ambulatory Visit: Payer: Self-pay

## 2018-12-27 ENCOUNTER — Telehealth (INDEPENDENT_AMBULATORY_CARE_PROVIDER_SITE_OTHER): Payer: Medicare HMO | Admitting: Family Medicine

## 2018-12-27 ENCOUNTER — Encounter (INDEPENDENT_AMBULATORY_CARE_PROVIDER_SITE_OTHER): Payer: Self-pay | Admitting: Family Medicine

## 2018-12-27 DIAGNOSIS — F3289 Other specified depressive episodes: Secondary | ICD-10-CM | POA: Diagnosis not present

## 2018-12-27 DIAGNOSIS — R7303 Prediabetes: Secondary | ICD-10-CM

## 2018-12-27 DIAGNOSIS — Z6841 Body Mass Index (BMI) 40.0 and over, adult: Secondary | ICD-10-CM | POA: Diagnosis not present

## 2018-12-27 DIAGNOSIS — F319 Bipolar disorder, unspecified: Secondary | ICD-10-CM | POA: Diagnosis not present

## 2018-12-27 MED ORDER — BUPROPION HCL ER (SR) 200 MG PO TB12: 200.0000 mg | ORAL_TABLET | Freq: Every day | ORAL | 0 refills | Status: DC

## 2018-12-27 NOTE — Progress Notes (Addendum)
Office: [phone]  /  Fax: [phone] TeleHealth Visit:  Susan [name] has verbally consented to this TeleHealth visit today. The patient is located at home, the provider is located at the [employer] [facility] office. The participants in this visit include the listed provider and patient. Susan [name] was unable to use realtime audiovisual technology today and the telehealth visit was conducted via telephone (20 minutes).   HPI:   Chief Complaint: OBESITY Susan [name] is here to discuss her progress with her obesity treatment plan. She is on the  follow the Category 2 plan and is following her eating plan approximately 40 % of the time. She states she is exercising by walking up and down the stairs 2-3 times a day daily.  Susan [name] is very stressed due to grown twin sons who are homeless and heroin addicts. This has affected her adherence to the plan. She skips meals quite often. She weighed 226 lbs at home today.  We were unable to weigh the patient today for this TeleHealth visit. She feels as if she has lost weight since her last visit. She has lost 0 lbs since starting treatment with [name].  Pre-Diabetes Susan [name] has a diagnosis of prediabetes based on her elevated HgA1c of 5.9 and was informed this puts her at greater risk of developing diabetes. Sheis taking [name] 1.2 mg daily as prescribed by PCP, but PCP recently left. [name] helps with polyphagia. She may need [name] to take over the prescribing and continues to work on diet and exercise to decrease risk of diabetes. She denies nausea or hypoglycemia. Lab Results  Component Value Date   HGBA1C 5.9 (H) 07/26/2018     Bipolar Disorder  Susan [name] has a diagnosis of Bipolar Disorder. She is on Wellbutrin for emotional eating. She has been taking at night but it has caused her to wake up at night and she eats when this happens. She is enjoying working with Dr. Mallie Mussel. She is taking all medications as directed. Susan [name] sees her psychiatrist  regularly.  Depression with emotional eating behaviors Susan [name] is struggling with emotional eating and using food for comfort to the extent that it is negatively impacting her health. She often snacks when she is not hungry. Susan [name] sometimes feels she is out of control and then feels guilty that she made poor food choices. She has been working on behavior modification techniques to help reduce her emotional eating and has been somewhat successful. She shows no sign of suicidal or homicidal ideations. She feels Dr. Mallie Mussel visits are helpful as well as the Wellbutrin. She is more cognizant of her emotional eating.   Depression screen Susan [name] 2/9 09/06/2018 07/26/2018 07/23/2018 07/19/2018 05/24/2018  Decreased Interest 1 2 1 1  0  Down, Depressed, Hopeless 1 2 2 2  0  PHQ - 2 Score 2 4 3 3  0  Altered sleeping 0 3 0 0 0  Tired, decreased energy 1 3 3 3  0  Change in appetite 1 1 0 0 0  Feeling bad or failure about yourself  1 2 0 0 0  Trouble concentrating 2 3 3 3  0  Moving slowly or fidgety/restless 0 2 1 1  0  Suicidal thoughts 0 1 0 0 0  PHQ-9 Score 7 19 10 10  0  Difficult doing work/chores - Somewhat difficult Somewhat difficult Somewhat difficult Not difficult at all  Some recent data might be hidden     ASSESSMENT AND PLAN:  Bipolar affective disorder, remission status unspecified ([place])  Other depression -  Plan: buPROPion (WELLBUTRIN SR) 200 MG 12 hr tablet  Prediabetes  Class 3 severe obesity with serious comorbidity and body mass index (BMI) of 40.0 to 44.9 in adult, unspecified obesity type [facility])  PLAN: Pre-Diabetes Susan [name] will continue to work on weight loss, exercise, and decreasing simple carbohydrates in her diet to help decrease the risk of diabetes. We dicussed metformin including benefits and risks. She was informed that eating too many simple carbohydrates or too many calories at one sitting increases the likelihood of GI side effects. Aubriegh agrees to continue [name] as  prescribed.  Susan [name] agreed to follow up with [name] as directed to monitor her progress.  Bipolar Disorder  Susan [name] will continue to see psychiatry regularly (next visit is Sept 3). She agrees to continue medications and counseling. Agrees to follow up with our clinic as directed.   Depression with Emotional Eating Behaviors We discussed behavior modification techniques today to help Susan [name] deal with her emotional eating and depression. She has agreed to take Wellbutrin SR 200 mg qd #90 with no refills and agreed to follow up as directed.  Obesity Susan [name] is currently in the action stage of change. As such, her goal is to continue with weight loss efforts She has agreed to follow the Category 2 plan Susan [name] has been instructed to work up to a goal of 150 minutes of combined cardio and strengthening exercise per week or continue as above for weight loss and overall health benefits. We discussed the following Behavioral Modification Strategies today: increasing lean protein intake and planning for success.    Susan [name] has agreed to follow up with our clinic in 3 weeks. She was informed of the importance of frequent follow up visits to maximize her success with intensive lifestyle modifications for her multiple health conditions.  ALLERGIES: Allergies  Allergen Reactions   Hydrochlorothiazide Other (See Comments)    Electrolyte imbalance   Lasix [Furosemide]     Electrolyte imbalance    MEDICATIONS: Current Outpatient Medications on File Prior to Visit  Medication Sig Dispense Refill   liraglutide ([name]) 18 MG/3ML SOPN Inject 1.2 mg into the skin daily.     albuterol (PROVENTIL HFA;VENTOLIN HFA) 108 (90 Base) MCG/ACT inhaler INHALE 1 TO 2 PUFFS EVERY 4 HOURS AS NEEDED FOR COUGH, WHEEZING, AND SHORTNESS OF BREATH 18 g 2   amLODipine (NORVASC) 5 MG tablet TAKE 1 TABLET (5 MG TOTAL) BY MOUTH DAILY. 90 tablet 1   atenolol (TENORMIN) 25 MG tablet TAKE 1 TABLET EVERY DAY 90 tablet 1    atorvastatin (LIPITOR) 10 MG tablet TAKE 1 TABLET (10 MG TOTAL) BY MOUTH AT BEDTIME. 90 tablet 1   cholecalciferol (VITAMIN D3) 25 MCG (1000 UT) tablet Take 1,000 Units by mouth daily.     clotrimazole (LOTRIMIN) 1 % cream Apply 1 application topically 2 (two) times daily. (Patient taking differently: Apply 1 application topically as needed. ) 30 g 0   cyclobenzaprine (FLEXERIL) 5 MG tablet Take 5 mg by mouth at bedtime.      DULoxetine (CYMBALTA) 60 MG capsule TAKE 1 CAPSULE (60 MG TOTAL) BY MOUTH 2 (TWO) TIMES DAILY. 180 capsule 1   gabapentin (NEURONTIN) 300 MG capsule Take 600 mg by mouth 2 (two) times daily.      HYDROcodone-acetaminophen (NORCO/VICODIN) 5-325 MG tablet One-half to one pill by mouth daily PRN ([facility])     loratadine (CLARITIN) 10 MG tablet Take 1 tablet (10 mg total) by mouth daily as needed for allergies. 90 tablet 3   losartan (  COZAAR) 100 MG tablet TAKE 1 TABLET EVERY DAY 90 tablet 1   tiotropium (SPIRIVA HANDIHALER) 18 MCG inhalation capsule INHALE THE CONTENTS OF 1 CAPSULE ONE TIME DAILY 90 capsule 1   vitamin B-12 (CYANOCOBALAMIN) 1000 MCG tablet Take 1,000 mcg by mouth daily.     Vitamin D, Ergocalciferol, (DRISDOL) 1.25 MG (50000 UT) CAPS capsule Take 1 capsule (50,000 Units total) by mouth every 7 (seven) days. 4 capsule 0   No current facility-administered medications on file prior to visit.     PAST MEDICAL HISTORY: Past Medical History:  Diagnosis Date   ADD (attention deficit disorder)    Alcohol abuse    Anemia    Anxiety    Aortic atherosclerosis ([place]) 02/20/2018   Chest CT Sept 2019   Arthritis    rheumatoid arthritis   Asthma    Bipolar disorder ([place])    Centrilobular emphysema ([place]) 02/20/2018   Chest CT Sept 2019   Constipation    Degenerative disc disease at L5-S1 level 09/28/2016   See ortho note May 2018   Depression    bipolar, hx of suicide attempt   Drug use    Dyspnea    with exertion   GERD  (gastroesophageal reflux disease)    H/O suicide attempt    slit wrists   Hip pain    History of alcohol abuse    History of diverticulitis 2013   History of hepatitis C    HEP "C"--three years ago   History of MRSA infection 2013   HLD (hyperlipidemia)    Hypertension    Hypothyroidism    Knee pain    MRSA carrier Nov 2013   Multinodular thyroid    OSA (obstructive sleep apnea)    managed by Dr. Manuella Ghazi   Osteoporosis    Post-traumatic osteoarthritis of right knee 09/24/2015   Prediabetes    Sleep apnea     Dx 3 years ago. Use C-PAP   Thyroid disease    Goiter   Vitamin B12 deficiency    Vitamin D deficiency disease     PAST SURGICAL HISTORY: Past Surgical History:  Procedure Laterality Date   BILATERAL SALPINGOOPHORECTOMY     due to abnormal mass   BREAST BIOPSY Left    neg   BREAST SURGERY Left 20 yrs ago   CESAREAN SECTION     COLONOSCOPY     COLONOSCOPY WITH PROPOFOL N/A 10/16/2017   Procedure: COLONOSCOPY WITH PROPOFOL;  Surgeon: Jonathon Bellows, MD;  Location: [facility] ENDOSCOPY;  Service: Gastroenterology;  Laterality: N/A;   HERNIA REPAIR  06/2011, July 2014   Ventral wall repair with Physiomesh   TONSILLECTOMY     TOTAL KNEE ARTHROPLASTY Right 05/10/2016   Procedure: TOTAL KNEE ARTHROPLASTY;  Surgeon: Corky Mull, MD;  Location: [facility] ORS;  Service: Orthopedics;  Laterality: Right;   TUBAL LIGATION      SOCIAL HISTORY: Social History   Tobacco Use   Smoking status: Current Every Day Smoker    Packs/day: 0.50    Years: 41.00    Pack years: 20.50    Types: Cigarettes   Smokeless tobacco: Never Used  Substance Use Topics   Alcohol use: No    Alcohol/week: 0.0 standard drinks    Comment: sober since October 2018   Drug use: No    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    FAMILY HISTORY: Family History  Problem Relation Age of Onset   Alcohol abuse Father    Heart attack  Father    Arthritis Mother     Asthma Mother    Mental illness Mother    Thyroid disease Mother    COPD Mother    Heart disease Mother    Congestive Heart Failure Mother    Alcohol abuse Mother    Eating disorder Mother    Arthritis Brother    Mental illness Brother    Cancer Brother        non-hodkins lymphoma   Alcohol abuse Sister    Drug abuse Sister    Mental illness Sister    Mental illness Sister    Fibromyalgia Sister    Obesity Sister    Pneumonia Sister    Mental illness Sister    Alcohol abuse Sister    Drug abuse Sister    Diabetes Neg Hx    Stroke Neg Hx    Breast cancer Neg Hx     ROS: Review of Systems  Endo/Heme/Allergies:       Negative for hypoglycemia   Psychiatric/Behavioral: Positive for depression. Negative for suicidal ideas.       Negative for homicidal ideations     PHYSICAL EXAM: Pt in no acute distress  RECENT LABS AND TESTS: BMET    Component Value Date/Time   NA 129 (L) 12/17/2018 1457   NA 139 07/26/2018 1101   NA 136 04/29/2014 1133   K 4.7 12/17/2018 1457   K 4.2 04/29/2014 1133   CL 97 (L) 12/17/2018 1457   CL 103 04/29/2014 1133   CO2 23 12/17/2018 1457   CO2 30 04/29/2014 1133   GLUCOSE 133 (H) 12/17/2018 1457   GLUCOSE 85 04/29/2014 1133   BUN 17 12/17/2018 1457   BUN 13 07/26/2018 1101   BUN 9 04/29/2014 1133   CREATININE 0.89 12/17/2018 1457   CREATININE 0.79 08/16/2017 0840   CALCIUM 8.7 (L) 12/17/2018 1457   CALCIUM 9.1 04/29/2014 1133   GFRNONAA >60 12/17/2018 1457   GFRNONAA 84 08/16/2017 0840   GFRAA >60 12/17/2018 1457   GFRAA 97 08/16/2017 0840   Lab Results  Component Value Date   HGBA1C 5.9 (H) 07/26/2018   HGBA1C 5.2 08/16/2017   HGBA1C 5.6 10/14/2016   HGBA1C 5.3 04/12/2016   HGBA1C 6.1 (H) 10/09/2015   Lab Results  Component Value Date   INSULIN 31.0 (H) 07/26/2018   CBC    Component Value Date/Time   WBC 18.1 (H) 12/17/2018 1457   RBC 4.37 12/17/2018 1457   HGB 13.8 12/17/2018 1457   HGB 15.2  07/26/2018 1101   HCT 40.7 12/17/2018 1457   HCT 43.4 07/26/2018 1101   PLT 247 12/17/2018 1457   PLT 241 10/09/2015 0933   MCV 93.1 12/17/2018 1457   MCV 91 07/26/2018 1101   MCV 91 04/29/2014 1133   MCH 31.6 12/17/2018 1457   MCHC 33.9 12/17/2018 1457   RDW 12.9 12/17/2018 1457   RDW 11.7 07/26/2018 1101   RDW 13.1 04/29/2014 1133   LYMPHSABS 5.1 (H) 07/26/2018 1101   LYMPHSABS 2.3 11/20/2012 1005   MONOABS 390 10/14/2016 0903   MONOABS 0.5 11/20/2012 1005   EOSABS 0.4 07/26/2018 1101   EOSABS 0.2 11/20/2012 1005   BASOSABS 0.1 07/26/2018 1101   BASOSABS 0.1 11/20/2012 1005   Iron/TIBC/Ferritin/ %Sat No results found for: IRON, TIBC, FERRITIN, IRONPCTSAT Lipid Panel     Component Value Date/Time   CHOL 158 07/26/2018 1101   TRIG 134 07/26/2018 1101   HDL 44 07/26/2018 1101   CHOLHDL 3.5  03/06/2018 1144   CHOLHDL 3.5 08/16/2017 0840   VLDL 27 10/14/2016 0903   LDLCALC 87 07/26/2018 1101   LDLCALC 74 08/16/2017 0840   Hepatic Function Panel     Component Value Date/Time   PROT 6.6 12/17/2018 1457   PROT 7.0 07/26/2018 1101   PROT 7.1 03/13/2013 1509   ALBUMIN 4.0 12/17/2018 1457   ALBUMIN 4.7 07/26/2018 1101   ALBUMIN 3.5 03/13/2013 1509   AST 18 12/17/2018 1457   AST 90 (H) 03/13/2013 1509   ALT 19 12/17/2018 1457   ALT 96 (H) 03/13/2013 1509   ALKPHOS 85 12/17/2018 1457   ALKPHOS 110 03/13/2013 1509   BILITOT 0.7 12/17/2018 1457   BILITOT 0.4 07/26/2018 1101   BILITOT 0.6 03/13/2013 1509   BILIDIR <0.1 12/17/2018 1457   IBILI NOT CALCULATED 12/17/2018 1457      Component Value Date/Time   TSH 1.430 07/26/2018 1101   TSH 0.85 08/16/2017 0840   TSH 0.94 10/14/2016 0903      I, Renee Ramus, am acting as Location manager for [employer], FNP   I have reviewed the above documentation for accuracy and completeness, and I agree with the above.  - Dawn Whitmire, FNP-C.

## 2018-12-31 ENCOUNTER — Other Ambulatory Visit: Payer: Self-pay | Admitting: Nurse Practitioner

## 2018-12-31 DIAGNOSIS — J432 Centrilobular emphysema: Secondary | ICD-10-CM

## 2018-12-31 DIAGNOSIS — R7303 Prediabetes: Secondary | ICD-10-CM | POA: Insufficient documentation

## 2018-12-31 MED ORDER — VITAMIN D (ERGOCALCIFEROL) 1.25 MG (50000 UNIT) PO CAPS: 50000.0000 [IU] | ORAL_CAPSULE | ORAL | 0 refills | Status: DC

## 2019-01-04 ENCOUNTER — Other Ambulatory Visit: Payer: Self-pay

## 2019-01-04 ENCOUNTER — Encounter: Payer: Self-pay | Admitting: Nurse Practitioner

## 2019-01-04 ENCOUNTER — Ambulatory Visit (INDEPENDENT_AMBULATORY_CARE_PROVIDER_SITE_OTHER): Payer: Medicare HMO | Admitting: Nurse Practitioner

## 2019-01-04 VITALS — BP 136/84 | HR 66 | Temp 97.3°F | Resp 14 | Ht 62.0 in | Wt 233.1 lb

## 2019-01-04 DIAGNOSIS — I10 Essential (primary) hypertension: Secondary | ICD-10-CM

## 2019-01-04 DIAGNOSIS — R413 Other amnesia: Secondary | ICD-10-CM

## 2019-01-04 DIAGNOSIS — R7303 Prediabetes: Secondary | ICD-10-CM | POA: Diagnosis not present

## 2019-01-04 DIAGNOSIS — F3170 Bipolar disorder, currently in remission, most recent episode unspecified: Secondary | ICD-10-CM

## 2019-01-04 DIAGNOSIS — E785 Hyperlipidemia, unspecified: Secondary | ICD-10-CM | POA: Diagnosis not present

## 2019-01-04 DIAGNOSIS — I700 Atherosclerosis of aorta: Secondary | ICD-10-CM | POA: Diagnosis not present

## 2019-01-04 DIAGNOSIS — J432 Centrilobular emphysema: Secondary | ICD-10-CM | POA: Diagnosis not present

## 2019-01-04 MED ORDER — ANORO ELLIPTA 62.5-25 MCG/INH IN AEPB: 1.0000 | INHALATION_SPRAY | Freq: Every day | RESPIRATORY_TRACT | 2 refills | Status: DC

## 2019-01-04 NOTE — Patient Instructions (Addendum)
Orthopedic  Chasnis, Juanda Crumble, DO  [address]  [place], [place] [address]  [place], [place], [place]  [facility]  [place], [place] [address]  [phone]   When you get the [place] stop Spiriva, follow-up with pulmonologist or continue follow-up with [name] for COPD, continue to work on cutting down smoking.  Keep up the great work with healthy eating and increasing activity.

## 2019-01-04 NOTE — Progress Notes (Signed)
Name: Susan [name]   MRN: [id number]    DOB: 1960-12-21   Date:01/04/2019       Progress Note  Subjective  Chief Complaint  Chief Complaint  Patient presents with  . Follow-up  . referrals    told her if she was alreadya patient she could just call for f/u  . Arm Pain    right for 2 weeks  . Diabetes    side effects to wellbutrin    HPI  Has noticed in the past few months has had increased memory impairment. Thinks it might be related to ADD but states she is concerned for alzhemier's and wants to see a neurologist. She has no family history of dementia in her family. States sometimes she will go to get a drink in her kitchen and it will take her 3 trips to remember to get it. States she has been having headaches for the past 3 weeks- noticed it when she increased her Wellbutrin dose also notes feeling a little bit off balance. Has not had any safety concerns with memory. Notes increase stress in her life and states she really thinks her memory issues are related to that- she is seeing psychiatry weekly.  6CIT Screen 01/04/2019 07/19/2018  What Year? 0 points 0 points  What month? 0 points 0 points  What time? 0 points 0 points  Count back from 20 0 points 0 points  Months in reverse 0 points 0 points  Repeat phrase 0 points 0 points  Total Score 0 0     Hypertension Patient is on atenolol 25mg  daily, amlodipine 5mg  daily, losartan 100mg  daily.  Takes medications as prescribed with no missed doses a month.  She is compliant with low-salt diet.  Denies chest pain, headaches, blurry vision.  Hyperlipidemia Patient rx atorvastatin 10mg  dailyTakes medications as prescribed with no missed doses a month.  Denies myalgias Has aortic atherosclerosis Lab Results  Component Value Date   CHOL 158 07/26/2018   HDL 44 07/26/2018   LDLCALC 87 07/26/2018   TRIG 134 07/26/2018   CHOLHDL 3.5 03/06/2018   COPD Smoking history: has smoked 1 ppd for 20 years cut back recently to 8 less  than half a pack.  Inhalers: spiriva daily, Albuterol PRN Endorses worsening shortness of breath and wheezing with exertion would like to see a pulmonologist.   Prediabetes and morbid obesity She is seeing bariatric specialist Leafy Ro MD She is taking victoza 1.2mg  daily & wellbutrin- was supposed to help with depression which lead to overeating but states she has been eating even more. Has follow-up next month for in person visit.  Lab Results  Component Value Date   HGBA1C 5.9 (H) 07/26/2018   Wt Readings from Last 3 Encounters:  01/04/19 233 lb 1.6 oz (105.7 kg)  12/17/18 226 lb (102.5 kg)  08/23/18 225 lb 8 oz (102.3 kg)   Bipolar disorder Sees psychiatry- states weekly right now. Is on cymbalta and wellbutrin- was having some improvements but has worsened since increased dose of wellbutrin. Discussed going to half dose and discussing with prescribing provider    PHQ2/9: Depression screen [facility] 2/9 01/04/2019 09/06/2018 07/26/2018 07/23/2018 07/19/2018  Decreased Interest 1 1 2 1 1   Down, Depressed, Hopeless 2 1 2 2 2   PHQ - 2 Score 3 2 4 3 3   Altered sleeping 2 0 3 0 0  Tired, decreased energy 2 1 3 3 3   Change in appetite 1 1 1  0 0  Feeling bad or failure  about yourself  1 1 2  0 0  Trouble concentrating 0 2 3 3 3   Moving slowly or fidgety/restless 0 0 2 1 1   Suicidal thoughts 0 0 1 0 0  PHQ-9 Score 9 7 19 10 10   Difficult doing work/chores Somewhat difficult - Somewhat difficult Somewhat difficult Somewhat difficult  Some recent data might be hidden     PHQ reviewed. Positive on wellbutrin sees psychiatrist  Patient Active Problem List   Diagnosis Date Noted  . Prediabetes 12/31/2018  . Depression 10/23/2018  . Class 3 severe obesity with serious comorbidity and body mass index (BMI) of 40.0 to 44.9 in adult ([place]) 10/23/2018  . Allergic rhinitis 08/23/2018  . Aortic atherosclerosis ([place]) 02/20/2018  . Centrilobular emphysema ([place]) 02/20/2018  . Dyslipidemia 03/28/2017   . Cocaine use disorder ([place]) 02/27/2017  . Degenerative disc disease at L5-S1 level 09/28/2016  . GERD (gastroesophageal reflux disease) 12/17/2015  . Chronic back pain 10/09/2015  . Post-traumatic osteoarthritis of right knee 09/24/2015  . Morbid obesity ([place]) 08/06/2015  . Alcoholism in recovery ([place]) 04/13/2015  . Vitamin D deficiency 04/09/2015  . Vitamin B12 deficiency 04/09/2015  . OSA on CPAP 04/09/2015  . Subclinical hyperthyroidism 06/26/2014  . Toxic multinodular goiter 06/26/2014  . Hypertension 06/26/2014  . Bipolar disorder ([place]) 06/26/2014  . Lumbar radiculitis 10/03/2013  . Diverticulosis 08/24/2013    Past Medical History:  Diagnosis Date  . ADD (attention deficit disorder)   . Alcohol abuse   . Anemia   . Anxiety   . Aortic atherosclerosis ([place]) 02/20/2018   Chest CT Sept 2019  . Arthritis    rheumatoid arthritis  . Asthma   . Bipolar disorder ([place])   . Centrilobular emphysema ([place]) 02/20/2018   Chest CT Sept 2019  . Constipation   . Degenerative disc disease at L5-S1 level 09/28/2016   See ortho note May 2018  . Depression    bipolar, hx of suicide attempt  . Drug use   . Dyspnea    with exertion  . GERD (gastroesophageal reflux disease)   . H/O suicide attempt    slit wrists  . Hepatitis C 06/26/2014  . Hip pain   . History of alcohol abuse   . History of diverticulitis 2013  . History of hepatitis C    HEP "C"--three years ago  . History of MRSA infection 2013  . HLD (hyperlipidemia)   . Hypertension   . Hypothyroidism   . Incisional hernia 11/08/2012  . Knee pain   . 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 Multinodular thyroid   . OSA (obstructive sleep apnea)    managed by Dr. Manuella Ghazi  . Osteoporosis   . Post-traumatic osteoarthritis of right knee 09/24/2015  . Prediabetes   . Recurrent ventral hernia 11/08/2012  . Sleep apnea     Dx 3 years ago. Use C-PAP  . 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 Thyroid disease    Goiter   . Vitamin B12 deficiency   . Vitamin D deficiency disease     Past Surgical History:  Procedure Laterality Date  . BILATERAL SALPINGOOPHORECTOMY     due to abnormal mass  . BREAST BIOPSY Left    neg  . BREAST SURGERY Left 20 yrs ago  . CESAREAN SECTION    . COLONOSCOPY    . COLONOSCOPY WITH PROPOFOL N/A 10/16/2017   Procedure: COLONOSCOPY WITH PROPOFOL;  Surgeon: Jonathon Bellows, MD;  Location: [facility] ENDOSCOPY;  Service: Gastroenterology;  Laterality: N/A;  . HERNIA REPAIR  06/2011, July 2014   Ventral wall repair with Physiomesh  . TONSILLECTOMY    . TOTAL KNEE ARTHROPLASTY Right 05/10/2016   Procedure: TOTAL KNEE ARTHROPLASTY;  Surgeon: Corky Mull, MD;  Location: [facility] ORS;  Service: Orthopedics;  Laterality: Right;  . TUBAL LIGATION      Social History   Tobacco Use  . Smoking status: Current Every Day Smoker    Packs/day: 0.50    Years: 41.00    Pack years: 20.50    Types: Cigarettes  . Smokeless tobacco: Never Used  Substance Use Topics  . Alcohol use: No    Alcohol/week: 0.0 standard drinks    Comment: sober since October 2018     Current Outpatient Medications:  .  albuterol (PROVENTIL HFA;VENTOLIN HFA) 108 (90 Base) MCG/ACT inhaler, INHALE 1 TO 2 PUFFS EVERY 4 HOURS AS NEEDED FOR COUGH, WHEEZING, AND SHORTNESS OF BREATH, Disp: 18 g, Rfl: 2 .  amLODipine (NORVASC) 5 MG tablet, TAKE 1 TABLET (5 MG TOTAL) BY MOUTH DAILY., Disp: 90 tablet, Rfl: 1 .  atenolol (TENORMIN) 25 MG tablet, TAKE 1 TABLET EVERY DAY, Disp: 90 tablet, Rfl: 1 .  atorvastatin (LIPITOR) 10 MG tablet, TAKE 1 TABLET (10 MG TOTAL) BY MOUTH AT BEDTIME., Disp: 90 tablet, Rfl: 1 .  buPROPion (WELLBUTRIN SR) 200 MG 12 hr tablet, Take 1 tablet (200 mg total) by mouth daily., Disp: 90 tablet, Rfl: 0 .  clotrimazole (LOTRIMIN) 1 % cream, Apply 1 application topically 2 (two) times daily. (Patient taking differently: Apply 1 application topically as needed. ), Disp: 30 g, Rfl: 0 .  cyclobenzaprine (FLEXERIL) 5 MG  tablet, Take 5 mg by mouth at bedtime. , Disp: , Rfl:  .  DULoxetine (CYMBALTA) 60 MG capsule, TAKE 1 CAPSULE (60 MG TOTAL) BY MOUTH 2 (TWO) TIMES DAILY., Disp: 180 capsule, Rfl: 1 .  gabapentin (NEURONTIN) 300 MG capsule, Take 600 mg by mouth 2 (two) times daily. , Disp: , Rfl:  .  HYDROcodone-acetaminophen (NORCO/VICODIN) 5-325 MG tablet, One-half to one pill by mouth daily PRN ([place]), Disp: , Rfl:  .  liraglutide ([name]) 18 MG/3ML SOPN, Inject 1.2 mg into the skin daily., Disp: , Rfl:  .  loratadine (CLARITIN) 10 MG tablet, Take 1 tablet (10 mg total) by mouth daily as needed for allergies., Disp: 90 tablet, Rfl: 3 .  losartan (COZAAR) 100 MG tablet, TAKE 1 TABLET EVERY DAY, Disp: 90 tablet, Rfl: 1 .  tiotropium (SPIRIVA HANDIHALER) 18 MCG inhalation capsule, INHALE THE CONTENTS OF 1 CAPSULE ONE TIME DAILY, Disp: 90 capsule, Rfl: 1 .  vitamin B-12 (CYANOCOBALAMIN) 1000 MCG tablet, Take 1,000 mcg by mouth daily., Disp: , Rfl:  .  cholecalciferol (VITAMIN D3) 25 MCG (1000 UT) tablet, Take 1,000 Units by mouth daily., Disp: , Rfl:  .  Vitamin D, Ergocalciferol, (DRISDOL) 1.25 MG (50000 UT) CAPS capsule, Take 1 capsule (50,000 Units total) by mouth every 7 (seven) days. (Patient not taking: Reported on 01/04/2019), Disp: 4 capsule, Rfl: 0  Allergies  Allergen Reactions  . Hydrochlorothiazide Other (See Comments)    Electrolyte imbalance  . Lasix [Furosemide]     Electrolyte imbalance    ROS   No other specific complaints in a complete review of systems (except as listed in HPI above).  Objective  Vitals:   01/04/19 1428  BP: 136/84  Pulse: 66  Resp: 14  Temp: (!) 97.3 F (36.3 C)  SpO2: 99%  Weight: 233 lb  1.6 oz (105.7 kg)  Height: 5\' 2"  (1.575 m)     Body mass index is 42.63 kg/m.  Nursing Note and Vital Signs reviewed.  Physical Exam Constitutional:      Appearance: Normal appearance. She is well-developed.  HENT:     Head: Normocephalic and atraumatic.     Right  Ear: Hearing normal.     Left Ear: Hearing normal.  Eyes:     Conjunctiva/sclera: Conjunctivae normal.  Cardiovascular:     Rate and Rhythm: Normal rate and regular rhythm.     Heart sounds: Normal heart sounds.  Pulmonary:     Effort: Pulmonary effort is normal.     Breath sounds: Normal breath sounds.  Musculoskeletal: Normal range of motion.  Neurological:     Mental Status: She is alert and oriented to person, place, and time.  Psychiatric:        Speech: Speech normal.        Behavior: Behavior normal. Behavior is cooperative.        Thought Content: Thought content normal.        Judgment: Judgment normal.        No results found for this or any previous visit (from the past 48 hour(s)).  Assessment & Plan  1. Essential hypertension Stable continue meds  2. Centrilobular emphysema (HCC) Smoking cessation recommended. Increase to anoro- refer to pulm for patient preference  - Ambulatory referral to Pulmonology - umeclidinium-vilanterol (ANORO ELLIPTA) 62.5-25 MCG/INH AEPB; Inhale 1 puff into the lungs daily.  Dispense: 60 each; Refill: 2  3. Aortic atherosclerosis (HCC) Continue statin, routine monitor of lipids   4. Dyslipidemia Continue statin, routine monitor of lipids   5. Prediabetes Weight loss and healthy eating   6. Morbid obesity ([place]) Weight loss and healthy eating; continue follow-up with weight management  Encouraged her to continue with healthy changes despite weight gain- regroup and work on portion sizes   7. Bipolar affective disorder in remission [facility]) Continue follow-up with psychiatry, consider half dose of wellbutrin   8. Memory impairment Half dose of wellbutrin until seen but prescribing provider- discussed stress reduction seems to be clear contributor follow-up shortly to see if further work-up is needed.

## 2019-01-08 ENCOUNTER — Encounter: Payer: Self-pay | Admitting: Pulmonary Disease

## 2019-01-08 ENCOUNTER — Ambulatory Visit (INDEPENDENT_AMBULATORY_CARE_PROVIDER_SITE_OTHER): Payer: Medicare HMO | Admitting: Pulmonary Disease

## 2019-01-08 ENCOUNTER — Other Ambulatory Visit: Payer: Self-pay

## 2019-01-08 VITALS — BP 132/70 | HR 68 | Temp 98.2°F | Ht 62.0 in | Wt 228.4 lb

## 2019-01-08 DIAGNOSIS — R0602 Shortness of breath: Secondary | ICD-10-CM | POA: Diagnosis not present

## 2019-01-08 DIAGNOSIS — J432 Centrilobular emphysema: Secondary | ICD-10-CM | POA: Diagnosis not present

## 2019-01-08 DIAGNOSIS — F17210 Nicotine dependence, cigarettes, uncomplicated: Secondary | ICD-10-CM

## 2019-01-08 DIAGNOSIS — E871 Hypo-osmolality and hyponatremia: Secondary | ICD-10-CM

## 2019-01-08 DIAGNOSIS — K219 Gastro-esophageal reflux disease without esophagitis: Secondary | ICD-10-CM

## 2019-01-08 DIAGNOSIS — G4733 Obstructive sleep apnea (adult) (pediatric): Secondary | ICD-10-CM

## 2019-01-08 DIAGNOSIS — Z9989 Dependence on other enabling machines and devices: Secondary | ICD-10-CM | POA: Diagnosis not present

## 2019-01-08 MED ORDER — OMEPRAZOLE 40 MG PO CPDR: 40.0000 mg | DELAYED_RELEASE_CAPSULE | Freq: Every day | ORAL | 2 refills | Status: DC

## 2019-01-08 MED ORDER — ANORO ELLIPTA 62.5-25 MCG/INH IN AEPB: 1.0000 | INHALATION_SPRAY | Freq: Every day | RESPIRATORY_TRACT | 0 refills | Status: AC

## 2019-01-08 NOTE — Progress Notes (Signed)
Subjective:    Patient ID: Susan [name], female    DOB: Feb 17, 1961, 58 y.o.   MRN: [id number]  HPI Patient is a 58 year old current smoker (8 cigarettes/day) who presents for evaluation of dyspnea which has worsened over the last 4 months.  She is referred by Susan Cheshire, NP.  She states that she has had dyspnea for a number of years but she attributed this to increasing weight.  Over the last 4 months she has noted to be worse particularly after heavy activity.  She has been recently diagnosed with asthma and COPD.  She has been maintained on Spiriva previously but as of 25 September she was switched to [facility] however she has not received this medication yet.  This was sent to a mail order pharmacy.  The patient has not had any chest pain, diaphoresis, orthopnea, paroxysmal nocturnal dyspnea or lower extremity edema.  She has not had a cough, no sputum production.  No hemoptysis.  She occasionally will have wheezing.  She has noted significant gastroesophageal reflux symptoms with regurgitation numbing up to the throat.  She has had significant issues with weight gain which she is trying to control.  She does have issues with obstructive sleep apnea and is on CPAP.  This is managed by Dr. Jennings [name].  She is on CPAP at 14 cm H2O and no oxygen bled in.  She reports restorative sleep and feeling energized in the mornings.  She has not had pulmonary function testing.  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  She had no lung lesions.  She was seen in the emergency room on 10 August for a single episode of chest pain which was attributed to a muscle pull after heavy yard work.  ECG x2 was normal, blood work was normal with the exception of sodium at 129 and leukocytosis with white count of 18,000.  She has been on Wellbutrin and has had  significant side effects from it and is now being weaned off of it.  Suspect that her hyponatremia may be related to this.  She is to have blood work coming up in the next few days as she is to follow-up with [facility].  Past medical and surgical history were reviewed.  They are as noted.  Family history also reviewed.  Social history the patient has smoked a pack of cigarettes per day for 44 years she has cut down over the last several months to 8 cigarettes/day.  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  She has had issues with past substance abuse but again remains clean in this regard.  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  She has been disabled and currently not employed.   Review of Systems  Constitutional: Positive for fatigue and unexpected weight change (Weight gain).  HENT: Positive for congestion.   Eyes: Negative.   Respiratory: Positive for shortness of breath and wheezing.   Cardiovascular: Negative.   Gastrointestinal:       Significant reflux with regurgitation.  Endocrine: Negative.   Genitourinary: Negative.   Musculoskeletal: Negative.   Skin: Negative.   Allergic/Immunologic: Negative.   Neurological: Negative.        Neurologic symptoms have resolved since she has decreased  Wellbutrin  Hematological: Negative.   Psychiatric/Behavioral: The patient is nervous/anxious.   All other systems reviewed and are negative.      Objective:   Physical Exam Vitals signs and nursing note reviewed.  Constitutional:      General: She is not in acute distress.    Appearance: She is obese.  HENT:     Head: Normocephalic and atraumatic.     Right Ear: External ear normal.     Left Ear: External ear normal.     Nose:     Comments: Nose/mouth/throat not examined due to masking requirements for COVID 19. Eyes:     General: No scleral icterus.     Conjunctiva/sclera: Conjunctivae normal.     Pupils: Pupils are equal, round, and reactive to light.  Neck:     Musculoskeletal: Neck supple.     Thyroid: No thyromegaly.     Trachea: Trachea and phonation normal.  Cardiovascular:     Rate and Rhythm: Normal rate and regular rhythm.     Pulses: Normal pulses.     Heart sounds: Normal heart sounds, S1 normal and S2 normal. No murmur.  Pulmonary:     Effort: Pulmonary effort is normal. No respiratory distress.     Comments: Coarse breath sounds, good air entry bilaterally. Abdominal:     General: Abdomen is protuberant.     Palpations: Abdomen is soft.  Musculoskeletal:     Right lower leg: No edema.     Left lower leg: No edema.  Skin:    General: Skin is warm and dry.  Neurological:     General: No focal deficit present.     Mental Status: She is alert and oriented to person, place, and time.  Psychiatric:        Mood and Affect: Mood normal.        Behavior: Behavior normal.    Recent Results (from the past 2160 hour(s))  Basic metabolic panel     Status: Abnormal   Collection Time: 12/17/18  2:57 PM  Result Value Ref Range   Sodium 129 (L) 135 - 145 mmol/L   Potassium 4.7 3.5 - 5.1 mmol/L   Chloride 97 (L) 98 - 111 mmol/L   CO2 23 22 - 32 mmol/L   Glucose, Bld 133 (H) 70 - 99 mg/dL   BUN 17 6 - 20 mg/dL   Creatinine, Ser 0.89 0.44 - 1.00 mg/dL   Calcium 8.7 (L) 8.9 - 10.3 mg/dL   GFR calc non Af Amer >60 >60 mL/min   GFR calc Af Amer >60 >60 mL/min   Anion gap 9 5 - 15    Comment: Performed at [facility], [address] [place], [place] [address]  CBC     Status: Abnormal   Collection Time: 12/17/18  2:57 PM  Result Value Ref Range   WBC 18.1 (H) 4.0 - 10.5 K/uL   RBC 4.37 3.87 - 5.11 MIL/uL   Hemoglobin 13.8 12.0 - 15.0 g/dL   HCT 40.7 36.0 - 46.0 %   MCV 93.1 80.0 - 100.0 fL   MCH 31.6 26.0 - 34.0 pg   MCHC 33.9 30.0 - 36.0 g/dL   RDW 12.9 11.5 - 15.5 %   Platelets 247 150 - 400 K/uL   nRBC  0.0 0.0 - 0.2 %    Comment: Performed at [facility], [address], [place], [place] [address]  Troponin I (High Sensitivity)     Status: None   Collection  Time: 12/17/18  2:57 PM  Result Value Ref Range   Troponin I (High Sensitivity) 3 <18 ng/L    Comment: (NOTE) Elevated high sensitivity troponin I (hsTnI) values and significant  changes across serial measurements may suggest ACS but many other  chronic and acute conditions are known to elevate hsTnI results.  Refer to the "Links" section for chest pain algorithms and additional  guidance. Performed at [facility], [address] [place], [place] [address]   Hepatic function panel     Status: None   Collection Time: 12/17/18  2:57 PM  Result Value Ref Range   Total Protein 6.6 6.5 - 8.1 g/dL   Albumin 4.0 3.5 - 5.0 g/dL   AST 18 15 - 41 U/L   ALT 19 0 - 44 U/L   Alkaline Phosphatase 85 38 - 126 U/L   Total Bilirubin 0.7 0.3 - 1.2 mg/dL   Bilirubin, Direct <0.1 0.0 - 0.2 mg/dL   Indirect Bilirubin NOT CALCULATED 0.3 - 0.9 mg/dL    Comment: Performed at [facility], [address] [place], [place] [address]  SARS Coronavirus 2 [facility] order, Performed in [facility] hospital lab) Nasopharyngeal Nasopharyngeal Swab     Status: None   Collection Time: 12/17/18  3:38 PM   Specimen: Nasopharyngeal Swab  Result Value Ref Range   SARS Coronavirus 2 NEGATIVE NEGATIVE    Comment: (NOTE) If result is NEGATIVE SARS-CoV-2 target nucleic acids are NOT DETECTED. The SARS-CoV-2 RNA is generally detectable in upper and lower  respiratory specimens during the acute phase of infection. The lowest  concentration of SARS-CoV-2 viral copies this assay can detect is 250  copies / mL. A negative result does not preclude SARS-CoV-2 infection  and should not be used as the sole basis for treatment or other  patient management decisions.  A negative result may occur with  improper specimen collection /  handling, submission of specimen other  than nasopharyngeal swab, presence of viral mutation(s) within the  areas targeted by this assay, and inadequate number of viral copies  (<250 copies / mL). A negative result must be combined with clinical  observations, patient history, and epidemiological information. If result is POSITIVE SARS-CoV-2 target nucleic acids are DETECTED. The SARS-CoV-2 RNA is generally detectable in upper and lower  respiratory specimens dur ing the acute phase of infection.  Positive  results are indicative of active infection with SARS-CoV-2.  Clinical  correlation with patient history and other diagnostic information is  necessary to determine patient infection status.  Positive results do  not rule out bacterial infection or co-infection with other viruses. If result is PRESUMPTIVE POSTIVE SARS-CoV-2 nucleic acids MAY BE PRESENT.   A presumptive positive result was obtained on the submitted specimen  and confirmed on repeat testing.  While 2019 novel coronavirus  (SARS-CoV-2) nucleic acids may be present in the submitted sample  additional confirmatory testing may be necessary for epidemiological  and / or clinical management purposes  to differentiate between  SARS-CoV-2 and other Sarbecovirus currently known to infect humans.  If clinically indicated additional testing with an alternate test  methodology [phone]) is advised. The SARS-CoV-2 RNA is generally  detectable in upper and lower respiratory sp ecimens during the acute  phase of infection. The expected result is Negative. Fact Sheet for Patients:  StrictlyIdeas.no Fact Sheet for Healthcare Providers: BankingDealers.co.za This test is not yet approved or cleared by the [place] FDA and has been authorized for detection and/or diagnosis  of SARS-CoV-2 by FDA under an Emergency Use Authorization (EUA).  This EUA will remain in effect (meaning this  test can be used) for the duration of the COVID-19 declaration under Section 564(b)(1) of the Act, 21 U.S.C. section 360bbb-3(b)(1), unless the authorization is terminated or revoked sooner. Performed at [facility], [address] [place], [place] [address]   Fibrin derivatives [facility] only)     Status: None   Collection Time: 12/17/18  3:52 PM  Result Value Ref Range   Fibrin derivatives D-dimer ([facility]) 399.86 0.00 - 499.00 ng/mL (FEU)    Comment: (NOTE) <> Exclusion of Venous Thromboembolism (VTE) - OUTPATIENT ONLY   (Emergency Department or [place])   0-499 ng/ml (FEU): With a low to intermediate pretest probability                      for VTE this test result excludes the diagnosis                      of VTE.   >499 ng/ml (FEU) : VTE not excluded; additional work up for VTE is                      required. <> Testing on Inpatients and Evaluation of Disseminated Intravascular   Coagulation (DIC) Reference Range:   0-499 ng/ml (FEU) Performed at [facility], [place], [place] [address]   Troponin I (High Sensitivity)     Status: None   Collection Time: 12/17/18  5:55 PM  Result Value Ref Range   Troponin I (High Sensitivity) 3 <18 ng/L    Comment: (NOTE) Elevated high sensitivity troponin I (hsTnI) values and significant  changes across serial measurements may suggest ACS but many other  chronic and acute conditions are known to elevate hsTnI results.  Refer to the "Links" section for chest pain algorithms and additional  guidance. Performed at [facility], [address] [place], [place] [address]     I reviewed the chest x-ray and it is as noted below:   Assessment & Plan:   1.  Dyspnea (shortness of breath): Etiology likely multifactorial.  Suspect the patient does have COPD underlying this is likely going to be mixed COPD that is centrilobular emphysema with chronic bronchitis type issues.  The patient will  need PFTs to evaluate this.  Additional issues are poorly controlled gastroesophageal reflux with actual episodes of regurgitation that may be leading to silent microaspiration.  Furthermore, the patient has issues with morbid obesity these can be aggravating this issue.  She has a history of obstructive sleep apnea and therefore should have echocardiogram to evaluate for potential pulmonary hypertension can be another cause for her dyspnea.  Please see evaluations ordered as below.  We will see her in follow-up in 4 to 6 weeks time, she is to contact us prior to that time should any new difficulties arise.  2.  COPD: She has had evidence of centrilobular emphysema with bronchial thickening likely due to chronic bronchitis.  Agree with switch to Anoro Ellipta which is LABA/LAMA combination.  The patient has not received her Anoro prescription yet as it was sent to mail order.  Have provided her with samples and instructed her on the use of the inhaler.  She is to discontinue use of Spiriva.  She will be scheduled for full PFTs to evaluate and to determine the degree and type of COPD.  3.  Gastroesophageal reflux: She has had significant symptoms in this regard.  She does have evidence of a hiatal hernia on prior CT scan.  Recommend antireflux measures, weight loss, trial of omeprazole 40 mg daily.  4.  Obstructive sleep apnea on CPAP: She is currently at 14 cm H2O of pressure, she may benefit from BiPAP, I have recommended that she revisit with Susan [name].  This issue adds complexity to her management.  5.  Morbid obesity: This issue adds complexity to her management and may be aggravating her sensation of dyspnea.  She is currently following at the [facility], she is enrolled in a weight loss program.  6.  Tobacco dependence due to cigarettes: Patient was counseled regards to discontinuation of smoking total counseling time 3 to 5 minutes.  7.  Hyponatremia: I suspect that this is  related to Wellbutrin.  She had had symptoms that were likely related to hyponatremia but as she has been decreasing Wellbutrin and tapering off of it she has felt better.  She is to have blood work performed at the [facility] and she should have basic metabolic panel repeated at that time.  Given her tendency towards hyponatremia she should not severely restrict sodium intake in her diet.   Thank you for allowing me to participate in this patient's care.   This chart was dictated using voice recognition software/Dragon.  Despite best efforts to proofread, errors can occur which can change the meaning.  Any change was purely unintentional.

## 2019-01-08 NOTE — Patient Instructions (Addendum)
1.  Agree with the Anoro Ellipta.  We have provided you with samples you can start it right away.  Stop using Spiriva.  2.  We will obtain breathing tests.  3.  We will obtain a heart test call echocardiogram (echo) that will allow me to see the structure of your heart chambers and determine the pressure of the artery that goes from the heart to the lungs.  4.  Check with Dr. Manuella Ghazi  with regards to your CPAP machine.  You may need to have the pressures rechecked.   5.  We will see you in follow-up in 4 to 6 weeks time.  6.  Take omeprazole daily.

## 2019-01-10 ENCOUNTER — Ambulatory Visit (INDEPENDENT_AMBULATORY_CARE_PROVIDER_SITE_OTHER): Payer: Medicare HMO | Admitting: Psychology

## 2019-01-10 DIAGNOSIS — F3289 Other specified depressive episodes: Secondary | ICD-10-CM

## 2019-01-16 ENCOUNTER — Ambulatory Visit (INDEPENDENT_AMBULATORY_CARE_PROVIDER_SITE_OTHER): Payer: Medicare HMO | Admitting: Family Medicine

## 2019-01-16 ENCOUNTER — Other Ambulatory Visit: Payer: Self-pay

## 2019-01-16 VITALS — BP 117/75 | HR 65 | Temp 97.9°F | Ht 63.0 in | Wt 231.6 lb

## 2019-01-16 DIAGNOSIS — F3289 Other specified depressive episodes: Secondary | ICD-10-CM

## 2019-01-16 DIAGNOSIS — Z6841 Body Mass Index (BMI) 40.0 and over, adult: Secondary | ICD-10-CM

## 2019-01-16 DIAGNOSIS — Z9189 Other specified personal risk factors, not elsewhere classified: Secondary | ICD-10-CM

## 2019-01-16 DIAGNOSIS — E559 Vitamin D deficiency, unspecified: Secondary | ICD-10-CM

## 2019-01-16 DIAGNOSIS — R7303 Prediabetes: Secondary | ICD-10-CM | POA: Diagnosis not present

## 2019-01-16 DIAGNOSIS — E66813 Obesity, class 3: Secondary | ICD-10-CM

## 2019-01-16 MED ORDER — INSULIN PEN NEEDLE 32G X 4 MM MISC: 0 refills | Status: DC

## 2019-01-16 MED ORDER — TOPIRAMATE 25 MG PO TABS: 25.0000 mg | ORAL_TABLET | Freq: Every day | ORAL | 0 refills | Status: DC

## 2019-01-16 MED ORDER — VICTOZA 18 MG/3ML ~~LOC~~ SOPN: 1.8000 mg | PEN_INJECTOR | Freq: Every day | SUBCUTANEOUS | 0 refills | Status: DC

## 2019-01-16 NOTE — Progress Notes (Signed)
Office: [phone]  /  Fax: [phone]   HPI:   Chief Complaint: Susan [name] is here to discuss her progress with her obesity treatment plan. She is on the lower carbohydrate, vegetable and lean protein rich diet plan and is following her eating plan approximately 70 % of the time. She states she is walking 30 to 60 minutes 4 to 5 times per week. Susan [name] has been trying to follow the low carbohydrate meal plan but her friends bring her sweets and she can't resist them. She feels she needs more structure in her plan. Her weight is 231 lb 9.6 oz (105.1 kg) today and has had a weight gain of 5 pounds since her last in-office visit. She has lost 0 lbs since starting treatment with [name].  Pre-Diabetes Susan [name] has a diagnosis of prediabetes based on her elevated Hgb A1c and was informed this puts her at greater risk of developing diabetes. She is on [name] 1.2 mg subQ daily. She continues to work on diet and exercise to decrease risk of diabetes. She admits polyphagia and cravings. Susan [name] denies hypoglycemia, nausea and constipation. Lab Results  Component Value Date   HGBA1C 5.4 01/16/2019    At risk for diabetes Susan [name] is at higher than average risk for developing diabetes due to her obesity and prediabetes. She currently denies polyuria or polydipsia.  Vitamin D deficiency Susan [name] has a diagnosis of vitamin D deficiency. She is currently taking vit D and her last vitamin D level was very low at 18.9 on 07/26/18. She denies nausea, vomiting or muscle weakness.  Depression with emotional eating behaviors Susan [name] stopped Bupropion due to insomnia. She is struggling with sweets and cravings. Susan [name] struggles with emotional eating and using food for comfort to the extent that it is negatively impacting her health. She often snacks when she is not hungry. Susan [name] sometimes feels she is out of control and then feels guilty that she made poor food choices. Susan [name] had one kidney stone 40 years ago.  She is aware of the increased risk for kidney stones with Topamax. She has been working on behavior modification techniques to help reduce her emotional eating and has been somewhat successful. She shows no sign of suicidal or homicidal ideations.  ASSESSMENT AND PLAN:  Prediabetes - Plan: liraglutide ([name]) 18 MG/3ML SOPN, Insulin Pen Needle 32G X 4 MM MISC, Hemoglobin A1c, Insulin, random  Vitamin D deficiency - Plan: VITAMIN D 25 Hydroxy (Vit-D Deficiency, Fractures)  Other depression - With emotional eating  - Plan: topiramate (TOPAMAX) 25 MG tablet  At risk for diabetes mellitus  Class 3 severe obesity with serious comorbidity and body mass index (BMI) of 40.0 to 44.9 in adult, unspecified obesity type Susan [name])  PLAN:  Pre-Diabetes Susan [name] will continue to work on weight loss, exercise, and decreasing simple carbohydrates in her diet to help decrease the risk of diabetes. We dicussed metformin including benefits and risks.  Susan [name] agreed to increase [name] to 1.8 mg sub q daily #4 pens with no refills. We will check Hgb A1c, fasting glucose and insulin today and Kima agreed to follow up with [name] as directed to monitor her progress.  Diabetes risk counseling Susan [name] was given extended (15 minutes) diabetes prevention counseling today. She is 58 y.o. female and has risk factors for diabetes including obesity and prediabetes. We discussed intensive lifestyle modifications today with an emphasis on weight loss as well as increasing exercise and decreasing simple carbohydrates in her diet.  Vitamin D Deficiency Susan [name] was informed  that low vitamin D levels contributes to fatigue and are associated with obesity, breast, and colon cancer. She agrees to continue to take prescription Vit D @50 ,000 IU every week and will follow up for routine testing of vitamin D, at least 2-3 times per year. She was informed of the risk of over-replacement of vitamin D and agrees to not increase her dose unless  she discusses this with [name] first. We will check vitamin D level today and Susan [name] will follow up as directed.  Depression with Emotional Eating Behaviors We discussed behavior modification techniques today to help Susan [name] deal with her emotional eating and depression. She has agreed to start Topamax 25 mg qHS #30 with no refills and follow up as directed. Patient denies history of glaucoma. She had one kidney stone 40 years ago but none since then. She was informed that topamax may increase her risk of kidney stones and she voiced understanding and still wanted to try the topamax.  She was informed of side effects.  Obesity Susan [name] is currently in the action stage of change. As such, her goal is to continue with weight loss efforts She has agreed to switch to the Category 2 plan Susan [name] will continue her current exercise regimen for weight loss and overall health benefits. She plans to start back to [employer]  We discussed the following Behavioral Modification Strategies today: planning for success, increasing lean protein intake and decreasing simple carbohydrates   Handouts for Category 2 were provided to patient today.  Susan [name] has agreed to follow up with our clinic in 2 weeks. She was informed of the importance of frequent follow up visits to maximize her success with intensive lifestyle modifications for her multiple health conditions.  ALLERGIES: Allergies  Allergen Reactions  . Hydrochlorothiazide Other (See Comments)    Electrolyte imbalance  . Lasix [Furosemide]     Electrolyte imbalance    MEDICATIONS: Current Outpatient Medications on File Prior to Visit  Medication Sig Dispense Refill  . albuterol (PROVENTIL HFA;VENTOLIN HFA) 108 (90 Base) MCG/ACT inhaler INHALE 1 TO 2 PUFFS EVERY 4 HOURS AS NEEDED FOR COUGH, WHEEZING, AND SHORTNESS OF BREATH 18 g 2  . amLODipine (NORVASC) 5 MG tablet TAKE 1 TABLET (5 MG TOTAL) BY MOUTH DAILY. 90 tablet 1  . atenolol (TENORMIN) 25 MG tablet  TAKE 1 TABLET EVERY DAY 90 tablet 1  . atorvastatin (LIPITOR) 10 MG tablet TAKE 1 TABLET (10 MG TOTAL) BY MOUTH AT BEDTIME. 90 tablet 1  . clotrimazole (LOTRIMIN) 1 % cream Apply 1 application topically 2 (two) times daily. (Patient taking differently: Apply 1 application topically as needed. ) 30 g 0  . cyclobenzaprine (FLEXERIL) 5 MG tablet Take 5 mg by mouth at bedtime.     . DULoxetine (CYMBALTA) 60 MG capsule TAKE 1 CAPSULE (60 MG TOTAL) BY MOUTH 2 (TWO) TIMES DAILY. 180 capsule 1  . gabapentin (NEURONTIN) 300 MG capsule Take 600 mg by mouth 2 (two) times daily.     Marland Kitchen HYDROcodone-acetaminophen (NORCO/VICODIN) 5-325 MG tablet One-half to one pill by mouth daily PRN ([facility])    . loratadine (CLARITIN) 10 MG tablet Take 1 tablet (10 mg total) by mouth daily as needed for allergies. 90 tablet 3  . losartan (COZAAR) 100 MG tablet TAKE 1 TABLET EVERY DAY 90 tablet 1  . omeprazole (PRILOSEC) 40 MG capsule Take 1 capsule (40 mg total) by mouth daily. 30 capsule 2  . umeclidinium-vilanterol (ANORO ELLIPTA) 62.5-25 MCG/INH AEPB Inhale 1 puff into the lungs  daily. 60 each 2  . vitamin B-12 (CYANOCOBALAMIN) 1000 MCG tablet Take 1,000 mcg by mouth daily.    . Vitamin D, Ergocalciferol, (DRISDOL) 1.25 MG (50000 UT) CAPS capsule Take 1 capsule (50,000 Units total) by mouth every 7 (seven) days. 4 capsule 0  . buPROPion (WELLBUTRIN SR) 200 MG 12 hr tablet Take 1 tablet (200 mg total) by mouth daily. (Patient not taking: Reported on 01/16/2019) 90 tablet 0   No current facility-administered medications on file prior to visit.     PAST MEDICAL HISTORY: Past Medical History:  Diagnosis Date  . ADD (attention deficit disorder)   . Alcohol abuse   . Anemia   . Anxiety   . Aortic atherosclerosis ([place]) 02/20/2018   Chest CT Sept 2019  . Arthritis    rheumatoid arthritis  . Asthma   . Bipolar disorder ([place])   . Centrilobular emphysema ([place]) 02/20/2018   Chest CT Sept 2019  . Constipation   . Degenerative  disc disease at L5-S1 level 09/28/2016   See ortho note May 2018  . Depression    bipolar, hx of suicide attempt  . Drug use   . Dyspnea    with exertion  . GERD (gastroesophageal reflux disease)   . H/O suicide attempt    slit wrists  . Hepatitis C 06/26/2014  . Hip pain   . History of alcohol abuse   . History of diverticulitis 2013  . History of hepatitis C    HEP "C"--three years ago  . History of MRSA infection 2013  . HLD (hyperlipidemia)   . Hypertension   . Hypothyroidism   . Incisional hernia 11/08/2012  . Knee pain   . 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 Multinodular thyroid   . OSA (obstructive sleep apnea)    managed by Dr. Manuella Ghazi  . Osteoporosis   . Post-traumatic osteoarthritis of right knee 09/24/2015  . Prediabetes   . Recurrent ventral hernia 11/08/2012  . Sleep apnea     Dx 3 years ago. Use C-PAP  . 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 Thyroid disease    Goiter  . Vitamin B12 deficiency   . Vitamin D deficiency disease     PAST SURGICAL HISTORY: Past Surgical History:  Procedure Laterality Date  . BILATERAL SALPINGOOPHORECTOMY     due to abnormal mass  . BREAST BIOPSY Left    neg  . BREAST SURGERY Left 20 yrs ago  . CESAREAN SECTION    . COLONOSCOPY    . COLONOSCOPY WITH PROPOFOL N/A 10/16/2017   Procedure: COLONOSCOPY WITH PROPOFOL;  Surgeon: Jonathon Bellows, MD;  Location: [name];  Service: Gastroenterology;  Laterality: N/A;  . HERNIA REPAIR  06/2011, July 2014   Ventral wall repair with Physiomesh  . TONSILLECTOMY    . TOTAL KNEE ARTHROPLASTY Right 05/10/2016   Procedure: TOTAL KNEE ARTHROPLASTY;  Surgeon: Corky Mull, MD;  Location: [facility];  Service: Orthopedics;  Laterality: Right;  . TUBAL LIGATION      SOCIAL HISTORY: Social History   Tobacco Use  . Smoking status: Current Every Day Smoker    Packs/day: 0.50    Years: 41.00    Pack years: 20.50    Types: Cigarettes  . Smokeless tobacco: Never Used  Substance  Use Topics  . Alcohol use: No    Alcohol/week: 0.0 standard drinks    Comment: sober since October 2018  . Drug use: No    Comment: former user of  inhale and injected cocaine    FAMILY HISTORY: Family History  Problem Relation Age of Onset  . Alcohol abuse Father   . Heart attack Father   . Arthritis Mother   . Asthma Mother   . Mental illness Mother   . Thyroid disease Mother   . COPD Mother   . Heart disease Mother   . Congestive Heart Failure Mother   . Alcohol abuse Mother   . Eating disorder Mother   . Arthritis Brother   . Mental illness Brother   . Cancer Brother        non-hodkins lymphoma  . Alcohol abuse Sister   . Drug abuse Sister   . Mental illness Sister   . Mental illness Sister   . Fibromyalgia Sister   . Obesity Sister   . Pneumonia Sister   . Mental illness Sister   . Alcohol abuse Sister   . Drug abuse Sister   . Diabetes Neg Hx   . Stroke Neg Hx   . Breast cancer Neg Hx     ROS: Review of Systems  Constitutional: Negative for weight loss.  Gastrointestinal: Negative for constipation, nausea and vomiting.  Genitourinary: Negative for frequency.  Musculoskeletal:       Negative for muscle weakness  Endo/Heme/Allergies: Negative for polydipsia.       Positive for polyphagia Positive for cravings Negative for hypoglycemia  Psychiatric/Behavioral: Positive for depression. Negative for suicidal ideas.    PHYSICAL EXAM: Blood pressure 117/75, pulse 65, temperature 97.9 F (36.6 C), temperature source Oral, height 5\' 3"  (1.6 m), weight 231 lb 9.6 oz (105.1 kg), SpO2 97 %. Body mass index is 41.03 kg/m. Physical Exam Vitals signs reviewed.  Constitutional:      Appearance: Normal appearance. She is well-developed. She is obese.  Cardiovascular:     Rate and Rhythm: Normal rate.  Pulmonary:     Effort: Pulmonary effort is normal.  Musculoskeletal: Normal range of motion.  Skin:    General: Skin is warm and dry.  Neurological:      Mental Status: She is alert and oriented to person, place, and time.  Psychiatric:        Mood and Affect: Mood normal.        Behavior: Behavior normal.        Thought Content: Thought content does not include homicidal or suicidal ideation.     RECENT LABS AND TESTS: BMET    Component Value Date/Time   NA 129 (L) 12/17/2018 1457   NA 139 07/26/2018 1101   NA 136 04/29/2014 1133   K 4.7 12/17/2018 1457   K 4.2 04/29/2014 1133   CL 97 (L) 12/17/2018 1457   CL 103 04/29/2014 1133   CO2 23 12/17/2018 1457   CO2 30 04/29/2014 1133   GLUCOSE 133 (H) 12/17/2018 1457   GLUCOSE 85 04/29/2014 1133   BUN 17 12/17/2018 1457   BUN 13 07/26/2018 1101   BUN 9 04/29/2014 1133   CREATININE 0.89 12/17/2018 1457   CREATININE 0.79 08/16/2017 0840   CALCIUM 8.7 (L) 12/17/2018 1457   CALCIUM 9.1 04/29/2014 1133   GFRNONAA >60 12/17/2018 1457   GFRNONAA 84 08/16/2017 0840   GFRAA >60 12/17/2018 1457   GFRAA 97 08/16/2017 0840   Lab Results  Component Value Date   HGBA1C 5.9 (H) 07/26/2018   HGBA1C 5.2 08/16/2017   HGBA1C 5.6 10/14/2016   HGBA1C 5.3 04/12/2016   HGBA1C 6.1 (H) 10/09/2015   Lab Results  Component  Value Date   INSULIN 31.0 (H) 07/26/2018   CBC    Component Value Date/Time   WBC 18.1 (H) 12/17/2018 1457   RBC 4.37 12/17/2018 1457   HGB 13.8 12/17/2018 1457   HGB 15.2 07/26/2018 1101   HCT 40.7 12/17/2018 1457   HCT 43.4 07/26/2018 1101   PLT 247 12/17/2018 1457   PLT 241 10/09/2015 0933   MCV 93.1 12/17/2018 1457   MCV 91 07/26/2018 1101   MCV 91 04/29/2014 1133   MCH 31.6 12/17/2018 1457   MCHC 33.9 12/17/2018 1457   RDW 12.9 12/17/2018 1457   RDW 11.7 07/26/2018 1101   RDW 13.1 04/29/2014 1133   LYMPHSABS 5.1 (H) 07/26/2018 1101   LYMPHSABS 2.3 11/20/2012 1005   MONOABS 390 10/14/2016 0903   MONOABS 0.5 11/20/2012 1005   EOSABS 0.4 07/26/2018 1101   EOSABS 0.2 11/20/2012 1005   BASOSABS 0.1 07/26/2018 1101   BASOSABS 0.1 11/20/2012 1005    Iron/TIBC/Ferritin/ %Sat No results found for: IRON, TIBC, FERRITIN, IRONPCTSAT Lipid Panel     Component Value Date/Time   CHOL 158 07/26/2018 1101   TRIG 134 07/26/2018 1101   HDL 44 07/26/2018 1101   CHOLHDL 3.5 03/06/2018 1144   CHOLHDL 3.5 08/16/2017 0840   VLDL 27 10/14/2016 0903   LDLCALC 87 07/26/2018 1101   LDLCALC 74 08/16/2017 0840   Hepatic Function Panel     Component Value Date/Time   PROT 6.6 12/17/2018 1457   PROT 7.0 07/26/2018 1101   PROT 7.1 03/13/2013 1509   ALBUMIN 4.0 12/17/2018 1457   ALBUMIN 4.7 07/26/2018 1101   ALBUMIN 3.5 03/13/2013 1509   AST 18 12/17/2018 1457   AST 90 (H) 03/13/2013 1509   ALT 19 12/17/2018 1457   ALT 96 (H) 03/13/2013 1509   ALKPHOS 85 12/17/2018 1457   ALKPHOS 110 03/13/2013 1509   BILITOT 0.7 12/17/2018 1457   BILITOT 0.4 07/26/2018 1101   BILITOT 0.6 03/13/2013 1509   BILIDIR <0.1 12/17/2018 1457   IBILI NOT CALCULATED 12/17/2018 1457      Component Value Date/Time   TSH 1.430 07/26/2018 1101   TSH 0.85 08/16/2017 0840   TSH 0.94 10/14/2016 0903     Ref. Range 07/26/2018 11:01  Vitamin D, 25-Hydroxy Latest Ref Range: 30.0 - 100.0 ng/mL 18.9 (L)    OBESITY BEHAVIORAL INTERVENTION VISIT  Today's visit was # 10   Starting weight: 226 lbs Starting date: 07/26/2018 Today's weight : 231 lbs  Today's date: 01/16/2019 Total lbs lost to date: 0    01/16/2019  Height 5\' 3"  (1.6 m)  Weight 231 lb 9.6 oz (105.1 kg)  BMI (Calculated) 41.04  BLOOD PRESSURE - SYSTOLIC [id number]  BLOOD PRESSURE - DIASTOLIC 75   Body Fat % [id number] %  Total Body Water (lbs) 86.6 lbs    ASK: We discussed the diagnosis of obesity with Susan [name] today and Susan [name] agreed to give [name] permission to discuss obesity behavioral modification therapy today.  ASSESS: Shanasia has the diagnosis of obesity and her BMI today is 41.04 Susan [name] is in the action stage of change   ADVISE: Susan [name] was educated on the multiple health risks of obesity as well as  the benefit of weight loss to improve her health. She was advised of the need for long term treatment and the importance of lifestyle modifications to improve her current health and to decrease her risk of future health problems.  AGREE: Multiple dietary modification options and treatment options were discussed  and  Susan [name] agreed to follow the recommendations documented in the above note.  ARRANGE: Susan [name] was educated on the importance of frequent visits to treat obesity as outlined per CMS and USPSTF guidelines and agreed to schedule her next follow up appointment today.  I, Doreene Nest, am acting as transcriptionist for [employer], FNP-C  I have reviewed the above documentation for accuracy and completeness, and I agree with the above.  - Romonia Yanik, FNP-C.

## 2019-01-17 LAB — HEMOGLOBIN A1C
Est. average glucose Bld gHb Est-mCnc: 108 mg/dL
Hgb A1c MFr Bld: 5.4 % (ref 4.8–5.6)

## 2019-01-17 LAB — VITAMIN D 25 HYDROXY (VIT D DEFICIENCY, FRACTURES): Vit D, 25-Hydroxy: 21.8 ng/mL — ABNORMAL LOW (ref 30.0–100.0)

## 2019-01-17 LAB — INSULIN, RANDOM: INSULIN: 38.8 u[IU]/mL — ABNORMAL HIGH (ref 2.6–24.9)

## 2019-01-21 ENCOUNTER — Ambulatory Visit
Admission: RE | Admit: 2019-01-21 | Discharge: 2019-01-21 | Disposition: A | Discharge status: Home / Self Care | Payer: Medicare HMO | Source: Ambulatory Visit | Attending: Pulmonary Disease | Admitting: Pulmonary Disease

## 2019-01-21 ENCOUNTER — Other Ambulatory Visit (INDEPENDENT_AMBULATORY_CARE_PROVIDER_SITE_OTHER): Payer: Self-pay | Admitting: Family Medicine

## 2019-01-21 ENCOUNTER — Encounter (INDEPENDENT_AMBULATORY_CARE_PROVIDER_SITE_OTHER): Payer: Self-pay | Admitting: Family Medicine

## 2019-01-21 ENCOUNTER — Other Ambulatory Visit: Payer: Self-pay

## 2019-01-21 DIAGNOSIS — E559 Vitamin D deficiency, unspecified: Secondary | ICD-10-CM

## 2019-01-21 DIAGNOSIS — R0600 Dyspnea, unspecified: Secondary | ICD-10-CM | POA: Diagnosis not present

## 2019-01-21 DIAGNOSIS — F319 Bipolar disorder, unspecified: Secondary | ICD-10-CM | POA: Diagnosis not present

## 2019-01-21 DIAGNOSIS — R0602 Shortness of breath: Secondary | ICD-10-CM | POA: Diagnosis not present

## 2019-01-21 DIAGNOSIS — I10 Essential (primary) hypertension: Secondary | ICD-10-CM | POA: Insufficient documentation

## 2019-01-21 NOTE — Progress Notes (Signed)
*  PRELIMINARY RESULTS* Echocardiogram 2D Echocardiogram has been performed.  Sherrie Sport 01/21/2019, 10:30 AM

## 2019-01-25 ENCOUNTER — Telehealth: Payer: Self-pay | Admitting: *Deleted

## 2019-01-25 NOTE — Telephone Encounter (Signed)
Left message for patient to notify them that it is time to schedule annual low dose lung cancer screening CT scan. Instructed patient to call back to verify information prior to the scan being scheduled.  

## 2019-01-28 ENCOUNTER — Encounter (INDEPENDENT_AMBULATORY_CARE_PROVIDER_SITE_OTHER): Payer: Self-pay

## 2019-01-29 ENCOUNTER — Other Ambulatory Visit: Payer: Self-pay

## 2019-01-29 ENCOUNTER — Ambulatory Visit (INDEPENDENT_AMBULATORY_CARE_PROVIDER_SITE_OTHER): Payer: Medicare HMO | Admitting: Psychology

## 2019-01-29 ENCOUNTER — Telehealth (INDEPENDENT_AMBULATORY_CARE_PROVIDER_SITE_OTHER): Payer: Medicare HMO | Admitting: Family Medicine

## 2019-01-29 ENCOUNTER — Encounter (INDEPENDENT_AMBULATORY_CARE_PROVIDER_SITE_OTHER): Payer: Self-pay | Admitting: Family Medicine

## 2019-01-29 DIAGNOSIS — F319 Bipolar disorder, unspecified: Secondary | ICD-10-CM | POA: Diagnosis not present

## 2019-01-29 DIAGNOSIS — E559 Vitamin D deficiency, unspecified: Secondary | ICD-10-CM | POA: Diagnosis not present

## 2019-01-29 DIAGNOSIS — F3289 Other specified depressive episodes: Secondary | ICD-10-CM

## 2019-01-29 DIAGNOSIS — E66813 Obesity, class 3: Secondary | ICD-10-CM

## 2019-01-29 DIAGNOSIS — Z6841 Body Mass Index (BMI) 40.0 and over, adult: Secondary | ICD-10-CM

## 2019-01-29 DIAGNOSIS — F341 Dysthymic disorder: Secondary | ICD-10-CM

## 2019-01-29 MED ORDER — VITAMIN D (ERGOCALCIFEROL) 1.25 MG (50000 UNIT) PO CAPS: 50000.0000 [IU] | ORAL_CAPSULE | ORAL | 0 refills | Status: DC

## 2019-01-29 MED ORDER — TOPIRAMATE 25 MG PO TABS: 25.0000 mg | ORAL_TABLET | Freq: Every day | ORAL | 0 refills | Status: DC

## 2019-01-31 NOTE — Progress Notes (Addendum)
Office: [phone]  /  Fax: [phone] TeleHealth Visit:  [name] has verbally consented to this TeleHealth visit today. The patient is located at home, the provider is located at the [employer] [facility] office. The participants in this visit include the listed provider and patient and any and all parties involved. The visit was conducted today via telephone. Susan [name] was unable to use realtime audiovisual technology today and the telehealth visit was conducted via telephone.  HPI:   Chief Complaint: OBESITY Susan [name] is here to discuss her progress with her obesity treatment plan. She is on the Category 2 plan and is following her eating plan approximately 60 % of the time. She states she is walking 60 minutes 7 times per week. Tammy is skipping breakfast and lunch, because she has been so busy. If she doesn't think about it, she is not hungry. She reports she may be in a manic phase of her bipolar disorder. She feels she can get on a more even keeled routine in October. We were unable to weigh the patient today for this TeleHealth visit. She feels as if she has lost weight since her last visit. She has lost 0 lbs since starting treatment with [name].  Vitamin D deficiency Susan [name] has a diagnosis of vitamin D deficiency. She is not at goal and she hasn't increased much with once weekly prescription vit D. Susan [name] denies nausea, vomiting or muscle weakness.  Bipolar Disorder Susan [name] feels she may be a bit manic right now. She also has ADHD. Susan [name] saw her psychiatrist this morning and he did not change her medications. She denies that Topamax has affected her mood. This was added for food cravings.  ASSESSMENT AND PLAN:  Bipolar affective disorder, remission status unspecified ([place])  Other depression - With emotional eating  - Plan: topiramate (TOPAMAX) 25 MG tablet  Vitamin D deficiency - Plan: Vitamin D, Ergocalciferol, (DRISDOL) 1.25 MG (50000 UT) CAPS capsule, DISCONTINUED:  Vitamin D, Ergocalciferol, (DRISDOL) 1.25 MG (50000 UT) CAPS capsule  Class 3 severe obesity with serious comorbidity and body mass index (BMI) of 40.0 to 44.9 in adult, unspecified obesity type (HCC)  PLAN:  Vitamin D Deficiency Susan [name] was informed that low vitamin D levels contributes to fatigue and are associated with obesity, breast, and colon cancer. Susan [name] agrees to continue to take prescription Vit D @50 ,000 IU and to increase the dose to every 3 days #10 with no refills. She will follow up for routine testing of vitamin D, at least 2-3 times per year. She was informed of the risk of over-replacement of vitamin D and agrees to not increase her dose unless she discusses this with [name] first. Susan [name] agrees to follow up with our clinic in 2 weeks.  Bipolar Disorder Susan [name] will continue to follow up with her psychiatrist. She will follow up with our clinic in 2 weeks.  Obesity Susan [name] is currently in the action stage of change. As such, her goal is to continue with weight loss efforts She has agreed to follow the Category 2 plan Susan [name] will continue walking 60 minutes 7 times per week for weight loss and overall health benefits. We discussed the following Behavioral Modification Strategies today: planning for success, keeping healthy foods in the home, no skipping meals, increasing lean protein intake, work on meal planning and easy cooking plans and avoiding temptations Krisanne will pack her lunch to take with her during the day. She will eat breakfast before she leaves home.  Susan [name] has agreed to  follow up with our clinic in 2 weeks. She was informed of the importance of frequent follow up visits to maximize her success with intensive lifestyle modifications for her multiple health conditions.  ALLERGIES: Allergies  Allergen Reactions  . Hydrochlorothiazide Other (See Comments)    Electrolyte imbalance  . Lasix [Furosemide]     Electrolyte imbalance    MEDICATIONS: Current Outpatient  Medications on File Prior to Visit  Medication Sig Dispense Refill  . albuterol (PROVENTIL HFA;VENTOLIN HFA) 108 (90 Base) MCG/ACT inhaler INHALE 1 TO 2 PUFFS EVERY 4 HOURS AS NEEDED FOR COUGH, WHEEZING, AND SHORTNESS OF BREATH 18 g 2  . amLODipine (NORVASC) 5 MG tablet TAKE 1 TABLET (5 MG TOTAL) BY MOUTH DAILY. 90 tablet 1  . atenolol (TENORMIN) 25 MG tablet TAKE 1 TABLET EVERY DAY 90 tablet 1  . atorvastatin (LIPITOR) 10 MG tablet TAKE 1 TABLET (10 MG TOTAL) BY MOUTH AT BEDTIME. 90 tablet 1  . buPROPion (WELLBUTRIN SR) 200 MG 12 hr tablet Take 1 tablet (200 mg total) by mouth daily. (Patient not taking: Reported on 01/16/2019) 90 tablet 0  . clotrimazole (LOTRIMIN) 1 % cream Apply 1 application topically 2 (two) times daily. (Patient taking differently: Apply 1 application topically as needed. ) 30 g 0  . cyclobenzaprine (FLEXERIL) 5 MG tablet Take 5 mg by mouth at bedtime.     . DULoxetine (CYMBALTA) 60 MG capsule TAKE 1 CAPSULE (60 MG TOTAL) BY MOUTH 2 (TWO) TIMES DAILY. 180 capsule 1  . gabapentin (NEURONTIN) 300 MG capsule Take 600 mg by mouth 2 (two) times daily.     Marland Kitchen HYDROcodone-acetaminophen (NORCO/VICODIN) 5-325 MG tablet One-half to one pill by mouth daily PRN ([facility])    . Insulin Pen Needle 32G X 4 MM MISC Use 1 needle daily to inject [name]. 100 each 0  . liraglutide ([name]) 18 MG/3ML SOPN Inject 0.3 mLs (1.8 mg total) into the skin daily. 3 pen 0  . loratadine (CLARITIN) 10 MG tablet Take 1 tablet (10 mg total) by mouth daily as needed for allergies. 90 tablet 3  . losartan (COZAAR) 100 MG tablet TAKE 1 TABLET EVERY DAY 90 tablet 1  . omeprazole (PRILOSEC) 40 MG capsule Take 1 capsule (40 mg total) by mouth daily. 30 capsule 2  . umeclidinium-vilanterol (ANORO ELLIPTA) 62.5-25 MCG/INH AEPB Inhale 1 puff into the lungs daily. 60 each 2  . vitamin B-12 (CYANOCOBALAMIN) 1000 MCG tablet Take 1,000 mcg by mouth daily.     No current facility-administered medications on file prior to  visit.     PAST MEDICAL HISTORY: Past Medical History:  Diagnosis Date  . ADD (attention deficit disorder)   . Alcohol abuse   . Anemia   . Anxiety   . Aortic atherosclerosis ([place]) 02/20/2018   Chest CT Sept 2019  . Arthritis    rheumatoid arthritis  . Asthma   . Bipolar disorder ([place])   . Centrilobular emphysema ([place]) 02/20/2018   Chest CT Sept 2019  . Constipation   . Degenerative disc disease at L5-S1 level 09/28/2016   See ortho note May 2018  . Depression    bipolar, hx of suicide attempt  . Drug use   . Dyspnea    with exertion  . GERD (gastroesophageal reflux disease)   . H/O suicide attempt    slit wrists  . Hepatitis C 06/26/2014  . Hip pain   . History of alcohol abuse   . History of diverticulitis 2013  . History of  hepatitis C    HEP "C"--three years ago  . History of MRSA infection 2013  . HLD (hyperlipidemia)   . Hypertension   . Hypothyroidism   . Incisional hernia 11/08/2012  . Knee pain   . 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 Multinodular thyroid   . OSA (obstructive sleep apnea)    managed by Dr. Manuella Ghazi  . Osteoporosis   . Post-traumatic osteoarthritis of right knee 09/24/2015  . Prediabetes   . Recurrent ventral hernia 11/08/2012  . Sleep apnea     Dx 3 years ago. Use C-PAP  . 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 Thyroid disease    Goiter  . Vitamin B12 deficiency   . Vitamin D deficiency disease     PAST SURGICAL HISTORY: Past Surgical History:  Procedure Laterality Date  . BILATERAL SALPINGOOPHORECTOMY     due to abnormal mass  . BREAST BIOPSY Left    neg  . BREAST SURGERY Left 20 yrs ago  . CESAREAN SECTION    . COLONOSCOPY    . COLONOSCOPY WITH PROPOFOL N/A 10/16/2017   Procedure: COLONOSCOPY WITH PROPOFOL;  Surgeon: Jonathon Bellows, MD;  [name] ENDOSCOPY;  Service: Gastroenterology;  Laterality: N/A;  . HERNIA REPAIR  06/2011, July 2014   Ventral wall repair with Physiomesh  . TONSILLECTOMY    . TOTAL KNEE  ARTHROPLASTY Right 05/10/2016   Procedure: TOTAL KNEE ARTHROPLASTY;  Surgeon: Corky Mull, MD;  Location: [facility] ORS;  Service: Orthopedics;  Laterality: Right;  . TUBAL LIGATION      SOCIAL HISTORY: Social History   Tobacco Use  . Smoking status: Current Every Day Smoker    Packs/day: 0.50    Years: 41.00    Pack years: 20.50    Types: Cigarettes  . Smokeless tobacco: Never Used  Substance Use Topics  . Alcohol use: No    Alcohol/week: 0.0 standard drinks    Comment: sober since October 2018  . 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 Alcohol abuse Father   . Heart attack Father   . Arthritis Mother   . Asthma Mother   . Mental illness Mother   . Thyroid disease Mother   . COPD Mother   . Heart disease Mother   . Congestive Heart Failure Mother   . Alcohol abuse Mother   . Eating disorder Mother   . Arthritis Brother   . Mental illness Brother   . Cancer Brother        non-hodkins lymphoma  . Alcohol abuse Sister   . Drug abuse Sister   . Mental illness Sister   . Mental illness Sister   . Fibromyalgia Sister   . Obesity Sister   . Pneumonia Sister   . Mental illness Sister   . Alcohol abuse Sister   . Drug abuse Sister   . Diabetes Neg Hx   . Stroke Neg Hx   . Breast cancer Neg Hx     ROS: Review of Systems  Constitutional: Positive for weight loss.  Gastrointestinal: Negative for nausea and vomiting.  Musculoskeletal:       Negative for muscle weakness    PHYSICAL EXAM: Pt in no acute distress  RECENT LABS AND TESTS: BMET    Component Value Date/Time   NA 129 (L) 12/17/2018 1457   NA 139 07/26/2018 1101  NA 136 04/29/2014 1133   K 4.7 12/17/2018 1457   K 4.2 04/29/2014 1133   CL 97 (L) 12/17/2018 1457   CL 103 04/29/2014 1133   CO2 23 12/17/2018 1457   CO2 30 04/29/2014 1133   GLUCOSE 133 (H) 12/17/2018 1457   GLUCOSE 85 04/29/2014 1133    BUN 17 12/17/2018 1457   BUN 13 07/26/2018 1101   BUN 9 04/29/2014 1133   CREATININE 0.89 12/17/2018 1457   CREATININE 0.79 08/16/2017 0840   CALCIUM 8.7 (L) 12/17/2018 1457   CALCIUM 9.1 04/29/2014 1133   GFRNONAA >60 12/17/2018 1457   GFRNONAA 84 08/16/2017 0840   GFRAA >60 12/17/2018 1457   GFRAA 97 08/16/2017 0840   Lab Results  Component Value Date   HGBA1C 5.4 01/16/2019   HGBA1C 5.9 (H) 07/26/2018   HGBA1C 5.2 08/16/2017   HGBA1C 5.6 10/14/2016   HGBA1C 5.3 04/12/2016   Lab Results  Component Value Date   INSULIN 38.8 (H) 01/16/2019   INSULIN 31.0 (H) 07/26/2018   CBC    Component Value Date/Time   WBC 18.1 (H) 12/17/2018 1457   RBC 4.37 12/17/2018 1457   HGB 13.8 12/17/2018 1457   HGB 15.2 07/26/2018 1101   HCT 40.7 12/17/2018 1457   HCT 43.4 07/26/2018 1101   PLT 247 12/17/2018 1457   PLT 241 10/09/2015 0933   MCV 93.1 12/17/2018 1457   MCV 91 07/26/2018 1101   MCV 91 04/29/2014 1133   MCH 31.6 12/17/2018 1457   MCHC 33.9 12/17/2018 1457   RDW 12.9 12/17/2018 1457   RDW 11.7 07/26/2018 1101   RDW 13.1 04/29/2014 1133   LYMPHSABS 5.1 (H) 07/26/2018 1101   LYMPHSABS 2.3 11/20/2012 1005   MONOABS 390 10/14/2016 0903   MONOABS 0.5 11/20/2012 1005   EOSABS 0.4 07/26/2018 1101   EOSABS 0.2 11/20/2012 1005   BASOSABS 0.1 07/26/2018 1101   BASOSABS 0.1 11/20/2012 1005   Iron/TIBC/Ferritin/ %Sat No results found for: IRON, TIBC, FERRITIN, IRONPCTSAT Lipid Panel     Component Value Date/Time   CHOL 158 07/26/2018 1101   TRIG 134 07/26/2018 1101   HDL 44 07/26/2018 1101   CHOLHDL 3.5 03/06/2018 1144   CHOLHDL 3.5 08/16/2017 0840   VLDL 27 10/14/2016 0903   LDLCALC 87 07/26/2018 1101   LDLCALC 74 08/16/2017 0840   Hepatic Function Panel     Component Value Date/Time   PROT 6.6 12/17/2018 1457   PROT 7.0 07/26/2018 1101   PROT 7.1 03/13/2013 1509   ALBUMIN 4.0 12/17/2018 1457   ALBUMIN 4.7 07/26/2018 1101   ALBUMIN 3.5 03/13/2013 1509   AST 18  12/17/2018 1457   AST 90 (H) 03/13/2013 1509   ALT 19 12/17/2018 1457   ALT 96 (H) 03/13/2013 1509   ALKPHOS 85 12/17/2018 1457   ALKPHOS 110 03/13/2013 1509   BILITOT 0.7 12/17/2018 1457   BILITOT 0.4 07/26/2018 1101   BILITOT 0.6 03/13/2013 1509   BILIDIR <0.1 12/17/2018 1457   IBILI NOT CALCULATED 12/17/2018 1457      Component Value Date/Time   TSH 1.430 07/26/2018 1101   TSH 0.85 08/16/2017 0840   TSH 0.94 10/14/2016 0903     Ref. Range 01/16/2019 08:10  Vitamin D, 25-Hydroxy Latest Ref Range: 30.0 - 100.0 ng/mL 21.8 (L)    I, Doreene Nest, am acting as Location manager for [employer], FNP-C  I have reviewed the above documentation for accuracy and completeness, and I agree with the above.  - [employer],  FNP-C.

## 2019-02-01 DIAGNOSIS — M5030 Other cervical disc degeneration, unspecified cervical region: Secondary | ICD-10-CM | POA: Diagnosis not present

## 2019-02-01 DIAGNOSIS — M5412 Radiculopathy, cervical region: Secondary | ICD-10-CM | POA: Diagnosis not present

## 2019-02-01 DIAGNOSIS — M7541 Impingement syndrome of right shoulder: Secondary | ICD-10-CM | POA: Diagnosis not present

## 2019-02-04 DIAGNOSIS — H2513 Age-related nuclear cataract, bilateral: Secondary | ICD-10-CM | POA: Diagnosis not present

## 2019-02-06 ENCOUNTER — Ambulatory Visit: Payer: Medicare HMO | Admitting: Pulmonary Disease

## 2019-02-06 ENCOUNTER — Encounter: Payer: Self-pay | Admitting: Pulmonary Disease

## 2019-02-06 ENCOUNTER — Other Ambulatory Visit: Payer: Self-pay

## 2019-02-06 VITALS — BP 122/78 | HR 89 | Temp 98.1°F | Ht 63.0 in | Wt 227.4 lb

## 2019-02-06 DIAGNOSIS — I5189 Other ill-defined heart diseases: Secondary | ICD-10-CM

## 2019-02-06 DIAGNOSIS — R0602 Shortness of breath: Secondary | ICD-10-CM

## 2019-02-06 NOTE — Patient Instructions (Signed)
1.  We will refer you to cardiology for follow-up since you wish to switch.  Will be [facility].  2.  Continue Anoro for now.  3.  We will call you with the results of your breathing test.  Please allow at least 1 week after the test is done for results.  4.  Continue your efforts to quit smoking.  5.  We will see him in follow-up in 3 months time call sooner should any new difficulties arise.

## 2019-02-08 DIAGNOSIS — M6281 Muscle weakness (generalized): Secondary | ICD-10-CM | POA: Diagnosis not present

## 2019-02-08 DIAGNOSIS — M7541 Impingement syndrome of right shoulder: Secondary | ICD-10-CM | POA: Diagnosis not present

## 2019-02-08 DIAGNOSIS — M5030 Other cervical disc degeneration, unspecified cervical region: Secondary | ICD-10-CM | POA: Diagnosis not present

## 2019-02-08 DIAGNOSIS — M2560 Stiffness of unspecified joint, not elsewhere classified: Secondary | ICD-10-CM | POA: Diagnosis not present

## 2019-02-08 DIAGNOSIS — M25511 Pain in right shoulder: Secondary | ICD-10-CM | POA: Diagnosis not present

## 2019-02-11 ENCOUNTER — Other Ambulatory Visit (INDEPENDENT_AMBULATORY_CARE_PROVIDER_SITE_OTHER): Payer: Self-pay | Admitting: Family Medicine

## 2019-02-11 ENCOUNTER — Telehealth: Payer: Self-pay | Admitting: Pulmonary Disease

## 2019-02-11 ENCOUNTER — Other Ambulatory Visit: Payer: Self-pay

## 2019-02-11 DIAGNOSIS — F3289 Other specified depressive episodes: Secondary | ICD-10-CM

## 2019-02-11 NOTE — Telephone Encounter (Signed)
Pt is aware of date/time of covid test.   

## 2019-02-12 ENCOUNTER — Ambulatory Visit (INDEPENDENT_AMBULATORY_CARE_PROVIDER_SITE_OTHER): Payer: Medicare HMO | Admitting: Family Medicine

## 2019-02-12 ENCOUNTER — Other Ambulatory Visit: Payer: Self-pay

## 2019-02-12 ENCOUNTER — Encounter (INDEPENDENT_AMBULATORY_CARE_PROVIDER_SITE_OTHER): Payer: Self-pay | Admitting: Family Medicine

## 2019-02-12 VITALS — BP 142/80 | HR 72 | Temp 98.2°F | Ht 63.0 in | Wt 223.0 lb

## 2019-02-12 DIAGNOSIS — E559 Vitamin D deficiency, unspecified: Secondary | ICD-10-CM | POA: Diagnosis not present

## 2019-02-12 DIAGNOSIS — Z6839 Body mass index (BMI) 39.0-39.9, adult: Secondary | ICD-10-CM | POA: Diagnosis not present

## 2019-02-12 DIAGNOSIS — E8881 Metabolic syndrome: Secondary | ICD-10-CM

## 2019-02-12 MED ORDER — VITAMIN D (ERGOCALCIFEROL) 1.25 MG (50000 UNIT) PO CAPS: 50000.0000 [IU] | ORAL_CAPSULE | ORAL | 0 refills | Status: DC

## 2019-02-13 ENCOUNTER — Other Ambulatory Visit (INDEPENDENT_AMBULATORY_CARE_PROVIDER_SITE_OTHER): Payer: Self-pay | Admitting: Family Medicine

## 2019-02-13 ENCOUNTER — Other Ambulatory Visit
Admission: RE | Admit: 2019-02-13 | Discharge: 2019-02-13 | Disposition: A | Discharge status: Home / Self Care | Payer: Medicare HMO | Source: Ambulatory Visit | Attending: Pulmonary Disease | Admitting: Pulmonary Disease

## 2019-02-13 DIAGNOSIS — Z20828 Contact with and (suspected) exposure to other viral communicable diseases: Secondary | ICD-10-CM | POA: Insufficient documentation

## 2019-02-13 DIAGNOSIS — Z01812 Encounter for preprocedural laboratory examination: Secondary | ICD-10-CM | POA: Insufficient documentation

## 2019-02-13 DIAGNOSIS — F3289 Other specified depressive episodes: Secondary | ICD-10-CM

## 2019-02-13 NOTE — Progress Notes (Signed)
Office: [phone]  /  Fax: [phone]   HPI:   Chief Complaint: [place]uff is here to discuss her progress with her obesity treatment plan. She is on the Category 2 plan and is following her eating plan approximately 70 % of the time. She states she is exercising 0 minutes 0 times per week. Khania is no longer skipping meals. She has been meal prepping and planning. She likes the Category 2 plan better than the low carb plan. Her weight is 223 lb (101.2 kg) today and has had a weight loss of 8 pounds since her last in-office visit. She has lost 3 lbs since starting treatment with [name].  Vitamin D deficiency Diarra has a diagnosis of vitamin D deficiency. Demeatrice is currently taking vit D and she is not at goal. Her last vitamin D level was at 21.8 on 01/16/19. She denies nausea, vomiting or muscle weakness.  Insulin Resistance Tahliah has a diagnosis of insulin resistance based on her elevated fasting insulin level >5. Although Nasha's blood glucose readings are still under good control, insulin resistance puts her at greater risk of metabolic syndrome and diabetes. She was previously prediabetic.  She is on [name], which is at 1.8 mg daily and this is helping with appetite. She continues to work on diet and exercise to decrease risk of diabetes. Rheann admits to constipation and she is taking fiber gummies. Lab Results  Component Value Date   HGBA1C 5.4 01/16/2019    ASSESSMENT AND PLAN:  Vitamin D deficiency - Plan: Vitamin D, Ergocalciferol, (DRISDOL) 1.25 MG (50000 UT) CAPS capsule  Insulin resistance  Class 2 severe obesity with serious comorbidity and body mass index (BMI) of 39.0 to 39.9 in adult, unspecified obesity type ([place])  PLAN:  Vitamin D Deficiency Damiana was informed that low vitamin D levels contributes to fatigue and are associated with obesity, breast, and colon cancer. Sheniece agrees to continue to take prescription Vit D @50 ,000 IU every 3 days #10 with no  refills and she will follow up for routine testing of vitamin D, at least 2-3 times per year. She was informed of the risk of over-replacement of vitamin D and agrees to not increase her dose unless she discusses this with [name] first. Madicyn agrees to follow up with our clinic in 2 weeks.  Insulin Resistance Talyn will continue to work on weight loss, exercise, and decreasing simple carbohydrates in her diet to help decrease the risk of diabetes. She was informed that eating too many simple carbohydrates or too many calories at one sitting increases the likelihood of GI side effects. Myrlene will increase her water intake and continue [name]. She will follow up with [name] as directed to monitor her progress.  Obesity Jara is currently in the action stage of change. As such, her goal is to continue with weight loss efforts She has agreed to follow the Category 2 plan with breakfast options [place] plans on starting exercise at the gym for weight loss and overall health benefits. We discussed the following Behavioral Modification Strategies today: planning for success and work on meal planning and easy cooking plans  Cassity has agreed to follow up with our clinic in 2 weeks. She was informed of the importance of frequent follow up visits to maximize her success with intensive lifestyle modifications for her multiple health conditions.  ALLERGIES: Allergies  Allergen Reactions  . Hydrochlorothiazide Other (See Comments)    Electrolyte imbalance  . Lasix [Furosemide]     Electrolyte imbalance  MEDICATIONS: Current Outpatient Medications on File Prior to Visit  Medication Sig Dispense Refill  . albuterol (PROVENTIL HFA;VENTOLIN HFA) 108 (90 Base) MCG/ACT inhaler INHALE 1 TO 2 PUFFS EVERY 4 HOURS AS NEEDED FOR COUGH, WHEEZING, AND SHORTNESS OF BREATH 18 g 2  . amLODipine (NORVASC) 5 MG tablet TAKE 1 TABLET (5 MG TOTAL) BY MOUTH DAILY. 90 tablet 1  . atenolol (TENORMIN) 25 MG tablet TAKE 1 TABLET  EVERY DAY 90 tablet 1  . atorvastatin (LIPITOR) 10 MG tablet TAKE 1 TABLET (10 MG TOTAL) BY MOUTH AT BEDTIME. 90 tablet 1  . buPROPion (WELLBUTRIN SR) 200 MG 12 hr tablet Take 1 tablet (200 mg total) by mouth daily. 90 tablet 0  . clotrimazole (LOTRIMIN) 1 % cream Apply 1 application topically 2 (two) times daily. (Patient taking differently: Apply 1 application topically as needed. ) 30 g 0  . cyclobenzaprine (FLEXERIL) 5 MG tablet Take 5 mg by mouth at bedtime.     . DULoxetine (CYMBALTA) 60 MG capsule TAKE 1 CAPSULE (60 MG TOTAL) BY MOUTH 2 (TWO) TIMES DAILY. 180 capsule 1  . gabapentin (NEURONTIN) 300 MG capsule Take 600 mg by mouth 2 (two) times daily.     Marland Kitchen HYDROcodone-acetaminophen (NORCO/VICODIN) 5-325 MG tablet One-half to one pill by mouth daily PRN ([facility])    . Insulin Pen Needle 32G X 4 MM MISC Use 1 needle daily to inject [name]. 100 each 0  . liraglutide ([name]) 18 MG/3ML SOPN Inject 0.3 mLs (1.8 mg total) into the skin daily. 3 pen 0  . loratadine (CLARITIN) 10 MG tablet Take 1 tablet (10 mg total) by mouth daily as needed for allergies. 90 tablet 3  . losartan (COZAAR) 100 MG tablet TAKE 1 TABLET EVERY DAY 90 tablet 1  . omeprazole (PRILOSEC) 40 MG capsule Take 1 capsule (40 mg total) by mouth daily. 30 capsule 2  . predniSONE (STERAPRED UNI-PAK 48 TAB) 10 MG (48) TBPK tablet     . topiramate (TOPAMAX) 25 MG tablet Take 1 tablet (25 mg total) by mouth daily. 30 tablet 0  . umeclidinium-vilanterol (ANORO ELLIPTA) 62.5-25 MCG/INH AEPB Inhale 1 puff into the lungs daily. 60 each 2  . vitamin B-12 (CYANOCOBALAMIN) 1000 MCG tablet Take 1,000 mcg by mouth daily.     No current facility-administered medications on file prior to visit.     PAST MEDICAL HISTORY: Past Medical History:  Diagnosis Date  . ADD (attention deficit disorder)   . Alcohol abuse   . Anemia   . Anxiety   . Aortic atherosclerosis ([place]) 02/20/2018   Chest CT Sept 2019  . Arthritis    rheumatoid arthritis   . Asthma   . Bipolar disorder ([place])   . Centrilobular emphysema ([place]) 02/20/2018   Chest CT Sept 2019  . Constipation   . Degenerative disc disease at L5-S1 level 09/28/2016   See ortho note May 2018  . Depression    bipolar, hx of suicide attempt  . Drug use   . Dyspnea    with exertion  . GERD (gastroesophageal reflux disease)   . H/O suicide attempt    slit wrists  . Hepatitis C 06/26/2014  . Hip pain   . History of alcohol abuse   . History of diverticulitis 2013  . History of hepatitis C    HEP "C"--three years ago  . History of MRSA infection 2013  . HLD (hyperlipidemia)   . Hypertension   . Hypothyroidism   . Incisional hernia 11/08/2012  .  Knee pain   . 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 Multinodular thyroid   . OSA (obstructive sleep apnea)    managed by Dr. Manuella Ghazi  . Osteoporosis   . Post-traumatic osteoarthritis of right knee 09/24/2015  . Prediabetes   . Recurrent ventral hernia 11/08/2012  . Sleep apnea     Dx 3 years ago. Use C-PAP  . 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 Thyroid disease    Goiter  . Vitamin B12 deficiency   . Vitamin D deficiency disease     PAST SURGICAL HISTORY: Past Surgical History:  Procedure Laterality Date  . BILATERAL SALPINGOOPHORECTOMY     due to abnormal mass  . BREAST BIOPSY Left    neg  . BREAST SURGERY Left 20 yrs ago  . CESAREAN SECTION    . COLONOSCOPY    . COLONOSCOPY WITH PROPOFOL N/A 10/16/2017   Procedure: COLONOSCOPY WITH PROPOFOL;  Surgeon: Jonathon Bellows, MD;  Location: [facility] ENDOSCOPY;  Service: Gastroenterology;  Laterality: N/A;  . HERNIA REPAIR  06/2011, July 2014   Ventral wall repair with Physiomesh  . TONSILLECTOMY    . TOTAL KNEE ARTHROPLASTY Right 05/10/2016   Procedure: TOTAL KNEE ARTHROPLASTY;  Surgeon: Corky Mull, MD;  Location: [facility] ORS;  Service: Orthopedics;  Laterality: Right;  . TUBAL LIGATION      SOCIAL HISTORY: Social History   Tobacco Use  . Smoking status: Current  Every Day Smoker    Packs/day: 0.50    Years: 41.00    Pack years: 20.50    Types: Cigarettes  . Smokeless tobacco: Never Used  Substance Use Topics  . Alcohol use: No    Alcohol/week: 0.0 standard drinks    Comment: sober since October 2018  . 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 Alcohol abuse Father   . Heart attack Father   . Arthritis Mother   . Asthma Mother   . Mental illness Mother   . Thyroid disease Mother   . COPD Mother   . Heart disease Mother   . Congestive Heart Failure Mother   . Alcohol abuse Mother   . Eating disorder Mother   . Arthritis Brother   . Mental illness Brother   . Cancer Brother        non-hodkins lymphoma  . Alcohol abuse Sister   . Drug abuse Sister   . Mental illness Sister   . Mental illness Sister   . Fibromyalgia Sister   . Obesity Sister   . Pneumonia Sister   . Mental illness Sister   . Alcohol abuse Sister   . Drug abuse Sister   . Diabetes Neg Hx   . Stroke Neg Hx   . Breast cancer Neg Hx     ROS: Review of Systems  Constitutional: Positive for weight loss.  Gastrointestinal: Positive for constipation. Negative for nausea and vomiting.  Musculoskeletal:       Negative for muscle weakness    PHYSICAL EXAM: Blood pressure (!) 142/80, pulse 72, temperature 98.2 F (36.8 C), temperature source Oral, height 5\' 3"  (1.6 m), weight 223 lb (101.2 kg), SpO2 97 %. Body mass index is 39.5 kg/m. Physical Exam Vitals signs reviewed.  Constitutional:      Appearance: Normal appearance. She is well-developed. She is obese.  Cardiovascular:     Rate and Rhythm: Normal rate.  Pulmonary:     Effort: Pulmonary effort is normal.  Musculoskeletal: Normal range of motion.  Skin:    General: Skin is warm and dry.  Neurological:     Mental Status: She is alert and oriented to person, place, and time.  Psychiatric:        Mood and  Affect: Mood normal.        Behavior: Behavior normal.     RECENT LABS AND TESTS: BMET    Component Value Date/Time   NA 129 (L) 12/17/2018 1457   NA 139 07/26/2018 1101   NA 136 04/29/2014 1133   K 4.7 12/17/2018 1457   K 4.2 04/29/2014 1133   CL 97 (L) 12/17/2018 1457   CL 103 04/29/2014 1133   CO2 23 12/17/2018 1457   CO2 30 04/29/2014 1133   GLUCOSE 133 (H) 12/17/2018 1457   GLUCOSE 85 04/29/2014 1133   BUN 17 12/17/2018 1457   BUN 13 07/26/2018 1101   BUN 9 04/29/2014 1133   CREATININE 0.89 12/17/2018 1457   CREATININE 0.79 08/16/2017 0840   CALCIUM 8.7 (L) 12/17/2018 1457   CALCIUM 9.1 04/29/2014 1133   GFRNONAA >60 12/17/2018 1457   GFRNONAA 84 08/16/2017 0840   GFRAA >60 12/17/2018 1457   GFRAA 97 08/16/2017 0840   Lab Results  Component Value Date   HGBA1C 5.4 01/16/2019   HGBA1C 5.9 (H) 07/26/2018   HGBA1C 5.2 08/16/2017   HGBA1C 5.6 10/14/2016   HGBA1C 5.3 04/12/2016   Lab Results  Component Value Date   INSULIN 38.8 (H) 01/16/2019   INSULIN 31.0 (H) 07/26/2018   CBC    Component Value Date/Time   WBC 18.1 (H) 12/17/2018 1457   RBC 4.37 12/17/2018 1457   HGB 13.8 12/17/2018 1457   HGB 15.2 07/26/2018 1101   HCT 40.7 12/17/2018 1457   HCT 43.4 07/26/2018 1101   PLT 247 12/17/2018 1457   PLT 241 10/09/2015 0933   MCV 93.1 12/17/2018 1457   MCV 91 07/26/2018 1101   MCV 91 04/29/2014 1133   MCH 31.6 12/17/2018 1457   MCHC 33.9 12/17/2018 1457   RDW 12.9 12/17/2018 1457   RDW 11.7 07/26/2018 1101   RDW 13.1 04/29/2014 1133   LYMPHSABS 5.1 (H) 07/26/2018 1101   LYMPHSABS 2.3 11/20/2012 1005   MONOABS 390 10/14/2016 0903   MONOABS 0.5 11/20/2012 1005   EOSABS 0.4 07/26/2018 1101   EOSABS 0.2 11/20/2012 1005   BASOSABS 0.1 07/26/2018 1101   BASOSABS 0.1 11/20/2012 1005   Iron/TIBC/Ferritin/ %Sat No results found for: IRON, TIBC, FERRITIN, IRONPCTSAT Lipid Panel     Component Value Date/Time   CHOL 158 07/26/2018 1101   TRIG 134  07/26/2018 1101   HDL 44 07/26/2018 1101   CHOLHDL 3.5 03/06/2018 1144   CHOLHDL 3.5 08/16/2017 0840   VLDL 27 10/14/2016 0903   LDLCALC 87 07/26/2018 1101   LDLCALC 74 08/16/2017 0840   Hepatic Function Panel     Component Value Date/Time   PROT 6.6 12/17/2018 1457   PROT 7.0 07/26/2018 1101   PROT 7.1 03/13/2013 1509   ALBUMIN 4.0 12/17/2018 1457   ALBUMIN 4.7 07/26/2018 1101   ALBUMIN 3.5 03/13/2013 1509   AST 18 12/17/2018 1457   AST 90 (H) 03/13/2013 1509   ALT 19 12/17/2018 1457   ALT 96 (H) 03/13/2013 1509   ALKPHOS 85 12/17/2018 1457   ALKPHOS 110 03/13/2013 1509   BILITOT 0.7 12/17/2018 1457   BILITOT 0.4 07/26/2018 1101  BILITOT 0.6 03/13/2013 1509   BILIDIR <0.1 12/17/2018 1457   IBILI NOT CALCULATED 12/17/2018 1457      Component Value Date/Time   TSH 1.430 07/26/2018 1101   TSH 0.85 08/16/2017 0840   TSH 0.94 10/14/2016 0903     Ref. Range 01/16/2019 08:10  Vitamin D, 25-Hydroxy Latest Ref Range: 30.0 - 100.0 ng/mL 21.8 (L)    OBESITY BEHAVIORAL INTERVENTION VISIT  Today's visit was # 13   Starting weight: 226 lbs Starting date: 07/26/2018 Today's weight : 223 lbs Today's date: 02/12/2019 Total lbs lost to date: 3    02/12/2019  Height 5\' 3"  (1.6 m)  Weight 223 lb (101.2 kg)  BMI (Calculated) 39.51  BLOOD PRESSURE - SYSTOLIC [id number]  BLOOD PRESSURE - DIASTOLIC 80   Body Fat % 123456 %    ASK: We discussed the diagnosis of obesity with [place] today and [place] agreed to give [name] permission to discuss obesity behavioral modification therapy today.  ASSESS: Lanija has the diagnosis of obesity and her BMI today is 39.51 Trana is in the action stage of change   ADVISE: Dayamin was educated on the multiple health risks of obesity as well as the benefit of weight loss to improve her health. She was advised of the need for long term treatment and the importance of lifestyle modifications to improve her current health and to decrease her risk of  future health problems.  AGREE: Multiple dietary modification options and treatment options were discussed and  Aleida agreed to follow the recommendations documented in the above note.  ARRANGE: Denaysia was educated on the importance of frequent visits to treat obesity as outlined per CMS and USPSTF guidelines and agreed to schedule her next follow up appointment today.  I, Doreene Nest, am acting as transcriptionist for [employer], FNP-C  I have reviewed the above documentation for accuracy and completeness, and I agree with the above.  - Kenasia Scheller, FNP-C.

## 2019-02-14 ENCOUNTER — Ambulatory Visit: Payer: Medicare HMO | Attending: Pulmonary Disease

## 2019-02-14 ENCOUNTER — Other Ambulatory Visit: Payer: Self-pay

## 2019-02-14 DIAGNOSIS — R0602 Shortness of breath: Secondary | ICD-10-CM | POA: Diagnosis not present

## 2019-02-14 LAB — SARS CORONAVIRUS 2 (TAT 6-24 HRS): SARS Coronavirus 2: NEGATIVE

## 2019-02-15 ENCOUNTER — Ambulatory Visit (INDEPENDENT_AMBULATORY_CARE_PROVIDER_SITE_OTHER): Payer: Medicare HMO | Admitting: Cardiology

## 2019-02-15 ENCOUNTER — Encounter: Payer: Self-pay | Admitting: Cardiology

## 2019-02-15 ENCOUNTER — Telehealth: Payer: Self-pay | Admitting: Pulmonary Disease

## 2019-02-15 VITALS — BP 130/70 | HR 80 | Ht 62.0 in | Wt 230.5 lb

## 2019-02-15 DIAGNOSIS — M7541 Impingement syndrome of right shoulder: Secondary | ICD-10-CM | POA: Diagnosis not present

## 2019-02-15 DIAGNOSIS — R0602 Shortness of breath: Secondary | ICD-10-CM | POA: Diagnosis not present

## 2019-02-15 DIAGNOSIS — F17200 Nicotine dependence, unspecified, uncomplicated: Secondary | ICD-10-CM | POA: Diagnosis not present

## 2019-02-15 DIAGNOSIS — E7800 Pure hypercholesterolemia, unspecified: Secondary | ICD-10-CM | POA: Diagnosis not present

## 2019-02-15 DIAGNOSIS — I10 Essential (primary) hypertension: Secondary | ICD-10-CM

## 2019-02-15 NOTE — Progress Notes (Signed)
Cardiology Office Note:    Date:  02/15/2019   ID:  Susan [name], DOB 07-13-1960, MRN [id number]  PCP:  Delsa Grana, PA-C  Cardiologist:  Kate Sable, MD  Electrophysiologist:  None   Referring MD: Tyler Pita, MD   Chief Complaint  Patient presents with  . new patient    Referred by PCP for SOB and Diastolic dysfunction. Meds reviewed verbally with patient.     History of Present Illness:    Susan [name] is a 58 y.o. female with a hx of hypertension, hyperlipidemia, current smoker x35+ years, OSA on CPAP, COPD who presents due to shortness of breath.  Patient states being short of breath for about 3 years now.  Her shortness of breath is typically worse in the summer months when it is hot.  She usually mows her own lawn with a push mower at least once a week.  She has noticed feeling more short of breath when she does her usual activity.  She also states being very stressed of late due to her kids having substance abuse problems.  She used to be a heavy drinker but has been sober for 2 years now.  She denies any chest pain, edema.  She had a recent echocardiogram performed about 3 weeks ago.  Results detailed below but briefly, ejection fraction was normal, there was no wall motion abnormalities, diastolic function was also normal.  Past Medical History:  Diagnosis Date  . ADD (attention deficit disorder)   . Alcohol abuse   . Anemia   . Anxiety   . Aortic atherosclerosis ([place]) 02/20/2018   Chest CT Sept 2019  . Arthritis    rheumatoid arthritis  . Asthma   . Bipolar disorder ([place])   . Centrilobular emphysema ([place]) 02/20/2018   Chest CT Sept 2019  . Constipation   . Degenerative disc disease at L5-S1 level 09/28/2016   See ortho note May 2018  . Depression    bipolar, hx of suicide attempt  . Drug use   . Dyspnea    with exertion  . GERD (gastroesophageal reflux disease)   . H/O suicide attempt    slit wrists  . Hepatitis C 06/26/2014  . Hip pain    . History of alcohol abuse   . History of diverticulitis 2013  . History of hepatitis C    HEP "C"--three years ago  . History of MRSA infection 2013  . HLD (hyperlipidemia)   . Hypertension   . Hypothyroidism   . Incisional hernia 11/08/2012  . Knee pain   . 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 Multinodular thyroid   . OSA (obstructive sleep apnea)    managed by Dr. Manuella Ghazi  . Osteoporosis   . Post-traumatic osteoarthritis of right knee 09/24/2015  . Prediabetes   . Recurrent ventral hernia 11/08/2012  . Sleep apnea     Dx 3 years ago. Use C-PAP  . 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 Thyroid disease    Goiter  . Vitamin B12 deficiency   . Vitamin D deficiency disease     Past Surgical History:  Procedure Laterality Date  . BILATERAL SALPINGOOPHORECTOMY     due to abnormal mass  . BREAST BIOPSY Left    neg  . BREAST SURGERY Left 20 yrs ago  . CESAREAN SECTION    . COLONOSCOPY    . COLONOSCOPY WITH PROPOFOL N/A 10/16/2017   Procedure: COLONOSCOPY WITH PROPOFOL;  Surgeon: Jonathon Bellows, MD;  Location: [facility] ENDOSCOPY;  Service: Gastroenterology;  Laterality: N/A;  . HERNIA REPAIR  06/2011, July 2014   Ventral wall repair with Physiomesh  . TONSILLECTOMY    . TOTAL KNEE ARTHROPLASTY Right 05/10/2016   Procedure: TOTAL KNEE ARTHROPLASTY;  Surgeon: Corky Mull, MD;  Location: [facility] ORS;  Service: Orthopedics;  Laterality: Right;  . TUBAL LIGATION      Current Medications: Current Meds  Medication Sig  . albuterol (PROVENTIL HFA;VENTOLIN HFA) 108 (90 Base) MCG/ACT inhaler INHALE 1 TO 2 PUFFS EVERY 4 HOURS AS NEEDED FOR COUGH, WHEEZING, AND SHORTNESS OF BREATH  . amLODipine (NORVASC) 5 MG tablet TAKE 1 TABLET (5 MG TOTAL) BY MOUTH DAILY.  Marland Kitchen atenolol (TENORMIN) 25 MG tablet TAKE 1 TABLET EVERY DAY  . atorvastatin (LIPITOR) 10 MG tablet TAKE 1 TABLET (10 MG TOTAL) BY MOUTH AT BEDTIME.  Marland Kitchen buPROPion (WELLBUTRIN SR) 200 MG 12 hr tablet Take 1 tablet (200 mg total) by  mouth daily.  . clotrimazole (LOTRIMIN) 1 % cream Apply 1 application topically 2 (two) times daily. (Patient taking differently: Apply 1 application topically as needed. )  . cyclobenzaprine (FLEXERIL) 5 MG tablet Take 5 mg by mouth at bedtime.   . DULoxetine (CYMBALTA) 60 MG capsule TAKE 1 CAPSULE (60 MG TOTAL) BY MOUTH 2 (TWO) TIMES DAILY.  Marland Kitchen gabapentin (NEURONTIN) 300 MG capsule Take 600 mg by mouth 2 (two) times daily.   Marland Kitchen HYDROcodone-acetaminophen (NORCO/VICODIN) 5-325 MG tablet One-half to one pill by mouth daily PRN ([facility])  . Insulin Pen Needle 32G X 4 MM MISC Use 1 needle daily to inject [name].  Marland Kitchen liraglutide ([name]) 18 MG/3ML SOPN Inject 0.3 mLs (1.8 mg total) into the skin daily.  Marland Kitchen loratadine (CLARITIN) 10 MG tablet Take 1 tablet (10 mg total) by mouth daily as needed for allergies.  Marland Kitchen losartan (COZAAR) 100 MG tablet TAKE 1 TABLET EVERY DAY  . omeprazole (PRILOSEC) 40 MG capsule Take 1 capsule (40 mg total) by mouth daily.  . predniSONE (STERAPRED UNI-PAK 48 TAB) 10 MG (48) TBPK tablet   . topiramate (TOPAMAX) 25 MG tablet Take 1 tablet (25 mg total) by mouth daily.  Marland Kitchen umeclidinium-vilanterol (ANORO ELLIPTA) 62.5-25 MCG/INH AEPB Inhale 1 puff into the lungs daily.  . vitamin B-12 (CYANOCOBALAMIN) 1000 MCG tablet Take 1,000 mcg by mouth daily.  . Vitamin D, Ergocalciferol, (DRISDOL) 1.25 MG (50000 UT) CAPS capsule Take 1 capsule (50,000 Units total) by mouth every 3 (three) days.     Allergies:   Hydrochlorothiazide and Lasix [furosemide]   Social History   Socioeconomic History  . Marital status: Widowed    Spouse name: Not on file  . Number of children: Not on file  . Years of education: Not on file  . Highest education level: Some college, no degree  Occupational History  . Occupation: disabled  Social Needs  . Financial resource strain: Not hard at all  . Food insecurity    Worry: Never true    Inability: Never true  . Transportation needs    Medical: No     Non-medical: No  Tobacco Use  . Smoking status: Current Every Day Smoker    Packs/day: 0.50    Years: 41.00    Pack years: 20.50    Types: Cigarettes  . Smokeless tobacco: Never Used  Substance and Sexual Activity  . Alcohol use: No    Alcohol/week: 0.0 standard drinks    Comment: sober since October 2018  . Drug use: No  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  . Sexual activity: Not Currently  Lifestyle  . Physical activity    Days per week: 7 days    Minutes per session: 30 min  . Stress: To some extent  Relationships  . Social connections    Talks on phone: More than three times a week    Gets together: Three times a week    Attends religious service: More than 4 times per year    Active member of club or organization: No    Attends meetings of clubs or organizations: Never    Relationship status: Widowed  Other Topics Concern  . Not on file  Social History Narrative  . Not on file     Family History: The patient's family history includes Alcohol abuse in her father, mother, sister, and sister; Arthritis in her brother and mother; Asthma in her mother; COPD in her mother; Cancer in her brother; Congestive Heart Failure in her mother; Drug abuse in her sister and sister; Eating disorder in her mother; Fibromyalgia in her sister; Heart attack in her father; Heart disease in her mother; Mental illness in her brother, mother, sister, sister, and sister; Obesity in her sister; Pneumonia in her sister; Thyroid disease in her mother. There is no history of Diabetes, Stroke, or Breast cancer.  ROS:   Please see the history of present illness.     All other systems reviewed and are negative.  EKGs/Labs/Other Studies Reviewed:    The following studies were reviewed today:  Echo 01/21/2019  1. The left ventricle has hyperdynamic systolic function, with an ejection fraction of >65%. The cavity size was normal. Left ventricular diastolic parameters were normal.  2.  The right ventricle has normal systolic function. The cavity was normal. There is no increase in right ventricular wall thickness.  3. The aorta is normal unless otherwise noted.  4. The interatrial septum was not assessed.  FINDINGS  Left Ventricle: The left ventricle has hyperdynamic systolic function, with an ejection fraction of >65%. The cavity size was normal. There is no increase in left ventricular wall thickness. Left ventricular diastolic parameters were normal.  Right Ventricle: The right ventricle has normal systolic function. The cavity was normal. There is no increase in right ventricular wall thickness.  Left Atrium: Left atrial size was normal in size.  Right Atrium: Right atrial size was normal in size. Right atrial pressure is estimated at 10 mmHg.  Interatrial Septum: The interatrial septum was not assessed.  Pericardium: There is no evidence of pericardial effusion.  Mitral Valve: The mitral valve is normal in structure. Mitral valve regurgitation is not visualized by color flow Doppler.  Tricuspid Valve: The tricuspid valve is normal in structure. Tricuspid valve regurgitation was not visualized by color flow Doppler.  Aortic Valve: The aortic valve is normal in structure. Aortic valve regurgitation was not visualized by color flow Doppler.  Pulmonic Valve: The pulmonic valve was not assessed. Pulmonic valve regurgitation is not visualized by color flow Doppler.  Aorta: The aorta is normal unless otherwise noted.  EKG:  EKG is  ordered today.  The ekg ordered today demonstrates normal sinus rhythm, normal ECG.  Recent Labs: 07/26/2018: TSH 1.430 12/17/2018: ALT 19; BUN 17; Creatinine, Ser 0.89; Hemoglobin 13.8; Platelets 247; Potassium 4.7; Sodium 129  Recent Lipid Panel    Component Value Date/Time   CHOL 158 07/26/2018 1101   TRIG 134 07/26/2018 1101   HDL 44 07/26/2018 1101   CHOLHDL 3.5  03/06/2018 1144   CHOLHDL 3.5 08/16/2017 0840   VLDL 27  10/14/2016 0903   LDLCALC 87 07/26/2018 1101   LDLCALC 74 08/16/2017 0840    Physical Exam:    VS:  BP 130/70 (BP Location: Right Arm, Patient Position: Sitting, Cuff Size: Normal)   Pulse 80   Ht 5\' 2"  (1.575 m)   Wt 230 lb 8 oz (104.6 kg)   BMI 42.16 kg/m     Wt Readings from Last 3 Encounters:  02/15/19 230 lb 8 oz (104.6 kg)  02/12/19 223 lb (101.2 kg)  02/06/19 227 lb 6.4 oz (103.1 kg)     GEN:  Well nourished, well developed in no acute distress HEENT: Normal NECK: No JVD; No carotid bruits LYMPHATICS: No lymphadenopathy CARDIAC: RRR, no murmurs, rubs, gallops RESPIRATORY:  Clear to auscultation anteriorly, decreased breath sounds at bases. ABDOMEN: Soft, non-tender, non-distended MUSCULOSKELETAL:  No edema; No deformity  SKIN: Warm and dry NEUROLOGIC:  Alert and oriented x 3 PSYCHIATRIC:  Normal affect   ASSESSMENT:   Her symptoms of shortness of breath and not likely of cardiac origin.  Her echocardiogram showed normal ejection fraction with no wall motion abnormalities, normal diastolic function.  Etiologies for shortness of breath include COPD, OSA, obesity. 1. SOB (shortness of breath)   2. Essential hypertension   3. Pure hypercholesterolemia   4. Smoking   5. Obesity, Class III, BMI 40-49.9 (morbid obesity) (HCC)    PLAN:    In order of problems listed above:  1. Echocardiogram was normal.  Shortness of breath likely due to combination of COPD, OSA, obesity.  Addressing the secondary causes will likely improve symptoms. 2. Blood pressure adequately controlled.  Continue losartan, amlodipine. 3. Continue Lipitor as currently prescribed. 4. Smoking cessation advised.  10 minutes spent for counseling patient. 5. Weight loss advised.  Patient has all plans to join a weight loss management program.   Medication Adjustments/Labs and Tests Ordered: Current medicines are reviewed at length with the patient today.  Concerns regarding medicines are outlined  above.  Orders Placed This Encounter  Procedures  . EKG 12-Lead   No orders of the defined types were placed in this encounter.   Patient Instructions  Medication Instructions:  - no changes  If you need a refill on your cardiac medications before your next appointment, please call your pharmacy.   Lab work: - none ordered  If you have labs (blood work) drawn today and your tests are completely normal, you will receive your results only by: Marland Kitchen MyChart Message (if you have MyChart) OR . A paper copy in the mail If you have any lab test that is abnormal or we need to change your treatment, we will call you to review the results.  Testing/Procedures: - none ordered  Follow-Up: At [facility], you and your health needs are our priority.  As part of our continuing mission to provide you with exceptional heart care, we have created designated Provider Care Teams.  These Care Teams include your primary Cardiologist (physician) and Advanced Practice Providers (APPs -  Physician Assistants and Nurse Practitioners) who all work together to provide you with the care you need, when you need it. Marland Kitchen as needed with Dr. Garen Lah  Any Other Special Instructions Will Be Listed Below (If Applicable). - N/A      Signed, Kate Sable, MD  02/15/2019 9:42 AM    [facility]

## 2019-02-15 NOTE — Patient Instructions (Signed)
Medication Instructions:  - no changes  If you need a refill on your cardiac medications before your next appointment, please call your pharmacy.   Lab work: - none ordered  If you have labs (blood work) drawn today and your tests are completely normal, you will receive your results only by: . MyChart Message (if you have MyChart) OR . A paper copy in the mail If you have any lab test that is abnormal or we need to change your treatment, we will call you to review the results.  Testing/Procedures: -none ordered  Follow-Up: At [facility], you and your health needs are our priority.  As part of our continuing mission to provide you with exceptional heart care, we have created designated Provider Care Teams.  These Care Teams include your primary Cardiologist (physician) and Advanced Practice Providers (APPs -  Physician Assistants and Nurse Practitioners) who all work together to provide you with the care you need, when you need it. . as needed with Dr. Agbor-Etang  Any Other Special Instructions Will Be Listed Below (If Applicable). - N/A   

## 2019-02-18 ENCOUNTER — Other Ambulatory Visit (INDEPENDENT_AMBULATORY_CARE_PROVIDER_SITE_OTHER): Payer: Self-pay | Admitting: Family Medicine

## 2019-02-18 DIAGNOSIS — F3289 Other specified depressive episodes: Secondary | ICD-10-CM

## 2019-02-18 DIAGNOSIS — E88819 Insulin resistance, unspecified: Secondary | ICD-10-CM | POA: Insufficient documentation

## 2019-02-18 DIAGNOSIS — E8881 Metabolic syndrome: Secondary | ICD-10-CM | POA: Insufficient documentation

## 2019-02-18 NOTE — Telephone Encounter (Signed)
Will route to Rhonda.  

## 2019-02-18 NOTE — Telephone Encounter (Signed)
Had left message for pt to make sure that she was aware of her appointment with Cardiology on 02/15/2019.  This attempt to verify appointment started several days prior to pt's appointment.  Pt just wanted to verify that there was nothing else needed. Nothing else needed at this time. Rhonda J Cobb

## 2019-02-19 ENCOUNTER — Ambulatory Visit (INDEPENDENT_AMBULATORY_CARE_PROVIDER_SITE_OTHER): Payer: Medicare HMO | Admitting: Psychology

## 2019-02-19 ENCOUNTER — Telehealth: Payer: Self-pay | Admitting: Pulmonary Disease

## 2019-02-19 ENCOUNTER — Other Ambulatory Visit (INDEPENDENT_AMBULATORY_CARE_PROVIDER_SITE_OTHER): Payer: Self-pay | Admitting: Family Medicine

## 2019-02-19 DIAGNOSIS — F341 Dysthymic disorder: Secondary | ICD-10-CM

## 2019-02-19 DIAGNOSIS — F3289 Other specified depressive episodes: Secondary | ICD-10-CM | POA: Diagnosis not present

## 2019-02-19 NOTE — Telephone Encounter (Signed)
Tyler Pita, MD  Blankenship, Margie A, CMA        No evidence of COPD. Her ERV is low which indicates issues with obesity. Otherwise test is fairly normal.    Left message for pt.

## 2019-02-20 DIAGNOSIS — M7541 Impingement syndrome of right shoulder: Secondary | ICD-10-CM | POA: Diagnosis not present

## 2019-02-22 NOTE — Telephone Encounter (Signed)
Call made to patient, confirmed DOB, made aware of PFT results per CG. Voiced understanding. Nothing further is needed at this time.

## 2019-02-26 ENCOUNTER — Encounter: Payer: Self-pay | Admitting: *Deleted

## 2019-02-26 ENCOUNTER — Telehealth: Payer: Self-pay | Admitting: *Deleted

## 2019-02-26 NOTE — Telephone Encounter (Signed)
Left message for patient to notify them that it is time to schedule annual low dose lung cancer screening CT scan. Instructed patient to call back to verify information prior to the scan being scheduled.  

## 2019-02-27 ENCOUNTER — Telehealth (INDEPENDENT_AMBULATORY_CARE_PROVIDER_SITE_OTHER): Payer: Medicare HMO | Admitting: Family Medicine

## 2019-02-27 ENCOUNTER — Other Ambulatory Visit: Payer: Self-pay

## 2019-02-27 DIAGNOSIS — F3289 Other specified depressive episodes: Secondary | ICD-10-CM | POA: Diagnosis not present

## 2019-02-27 DIAGNOSIS — Z6841 Body Mass Index (BMI) 40.0 and over, adult: Secondary | ICD-10-CM

## 2019-02-27 DIAGNOSIS — R7303 Prediabetes: Secondary | ICD-10-CM | POA: Diagnosis not present

## 2019-02-27 MED ORDER — TOPIRAMATE 25 MG PO TABS: 25.0000 mg | ORAL_TABLET | Freq: Every day | ORAL | 0 refills | Status: DC

## 2019-02-27 MED ORDER — VICTOZA 18 MG/3ML ~~LOC~~ SOPN: 1.8000 mg | PEN_INJECTOR | Freq: Every day | SUBCUTANEOUS | 0 refills | Status: DC

## 2019-02-28 DIAGNOSIS — M24159 Other articular cartilage disorders, unspecified hip: Secondary | ICD-10-CM | POA: Diagnosis not present

## 2019-02-28 DIAGNOSIS — M25552 Pain in left hip: Secondary | ICD-10-CM | POA: Diagnosis not present

## 2019-02-28 DIAGNOSIS — M869 Osteomyelitis, unspecified: Secondary | ICD-10-CM | POA: Diagnosis not present

## 2019-02-28 DIAGNOSIS — M1612 Unilateral primary osteoarthritis, left hip: Secondary | ICD-10-CM | POA: Diagnosis not present

## 2019-03-04 NOTE — Progress Notes (Signed)
Office: [phone]  /  Fax: [phone] TeleHealth Visit:  Susan [name] has verbally consented to this TeleHealth visit today. The patient is located at home, the provider is located at the [employer] [facility] office. The participants in this visit include the listed provider and patient and any and all parties involved. The visit was conducted today via telephone. Susan [name] was unable to use realtime audiovisual technology today and the telehealth visit was conducted via telephone (time spent on call was 20 minutes).  HPI:   Chief Complaint: OBESITY Susan [name] is here to discuss her progress with her obesity treatment plan. She is on the Category 2 plan and is following her eating plan approximately 75 % of the time. She states she is exercising 0 minutes 0 times per week. Susan [name] is experiencing severe left hip pain and she will have a hip injection tomorrow. She had two years sobriety (alcohol) celebration today with AA and she made two cakes but she did not eat any. Her appetite is decreased, secondary to the pain. She is doing fairly well with getting her protein in. She is having dental problems and she is having trouble chewing. We were unable to weigh the patient today for this TeleHealth visit. She feels as if she has maintained weight since her last visit (weight not reported). She has lost 8 lbs since starting treatment with [name].  Pre-Diabetes Susan [name] has a diagnosis of prediabetes based on her elevated Hgb A1c and was informed this puts her at greater risk of developing diabetes. She is on [name] 1.8 mg daily which helps with polyphagia. She continues to work on diet and exercise to decrease risk of diabetes. She denies hypoglycemia. Lab Results  Component Value Date   HGBA1C 5.4 01/16/2019    Depression with emotional eating behaviors Susan [name] feels Topamax is helping with cravings. She denies any side effects from Topamax. Susan [name] also sees psychiatry. She is also taking  bupropion and Cymbalta. Susan [name]  struggles with emotional eating and using food for comfort to the extent that it is negatively impacting her health. She often snacks when she is not hungry. Susan [name] sometimes feels she is out of control and then feels guilty that she made poor food choices. She has been working on behavior modification techniques to help reduce her emotional eating and has been somewhat successful. She shows no sign of suicidal or homicidal ideations.  ASSESSMENT AND PLAN:  Prediabetes - Plan: liraglutide ([name]) 18 MG/3ML SOPN  Other depression - With emotional eating  - Plan: topiramate (TOPAMAX) 25 MG tablet  Class 3 severe obesity with serious comorbidity and body mass index (BMI) of 40.0 to 44.9 in adult, unspecified obesity type [facility])  PLAN:  Pre-Diabetes Susan [name] will continue to work on weight loss, exercise, and decreasing simple carbohydrates in her diet to help decrease the risk of diabetes.Susan [name] agrees to continue victoza 1.8 mg sub Q daily #3 pens with no refills and follow up with [name] as directed to monitor her progress.  Depression with Emotional Eating Behaviors We discussed behavior modification techniques today to help Susan [name] deal with her emotional eating and depression. She has agreed to continue Topamax 25 mg daily #30 with no refills and follow up as directed. She will also continue Cymbalta and bupropion. She will follow up with psychiatry as scheduled.   Obesity Susan [name] is currently in the action stage of change. As such, her goal is to continue with weight loss efforts She has agreed to follow the Category 2  plan Susan [name] plans on starting at the gym 03/10/19 for weight loss and overall health benefits. We discussed the following Behavioral Modification Strategies today: planning for success, increasing lean protein intake and work on meal planning and easy cooking plans  We discussed substitutions for 2 ounces of meat that are soft. She may do a Premier  shake instead of a meal if she needs to.  Susan [name] has agreed to follow up with our clinic in 3 weeks. She was informed of the importance of frequent follow up visits to maximize her success with intensive lifestyle modifications for her multiple health conditions.  ALLERGIES: Allergies  Allergen Reactions  . Hydrochlorothiazide Other (See Comments)    Electrolyte imbalance  . Lasix [Furosemide]     Electrolyte imbalance    MEDICATIONS: Current Outpatient Medications on File Prior to Visit  Medication Sig Dispense Refill  . albuterol (PROVENTIL HFA;VENTOLIN HFA) 108 (90 Base) MCG/ACT inhaler INHALE 1 TO 2 PUFFS EVERY 4 HOURS AS NEEDED FOR COUGH, WHEEZING, AND SHORTNESS OF BREATH 18 g 2  . amLODipine (NORVASC) 5 MG tablet TAKE 1 TABLET (5 MG TOTAL) BY MOUTH DAILY. 90 tablet 1  . atenolol (TENORMIN) 25 MG tablet TAKE 1 TABLET EVERY DAY 90 tablet 1  . atorvastatin (LIPITOR) 10 MG tablet TAKE 1 TABLET (10 MG TOTAL) BY MOUTH AT BEDTIME. 90 tablet 1  . buPROPion (WELLBUTRIN SR) 200 MG 12 hr tablet Take 1 tablet (200 mg total) by mouth daily. 90 tablet 0  . clotrimazole (LOTRIMIN) 1 % cream Apply 1 application topically 2 (two) times daily. (Patient taking differently: Apply 1 application topically as needed. ) 30 g 0  . cyclobenzaprine (FLEXERIL) 5 MG tablet Take 5 mg by mouth at bedtime.     . DULoxetine (CYMBALTA) 60 MG capsule TAKE 1 CAPSULE (60 MG TOTAL) BY MOUTH 2 (TWO) TIMES DAILY. 180 capsule 1  . gabapentin (NEURONTIN) 300 MG capsule Take 600 mg by mouth 2 (two) times daily.     Marland Kitchen HYDROcodone-acetaminophen (NORCO/VICODIN) 5-325 MG tablet One-half to one pill by mouth daily PRN ([facility])    . Insulin Pen Needle 32G X 4 MM MISC Use 1 needle daily to inject [name]. 100 each 0  . loratadine (CLARITIN) 10 MG tablet Take 1 tablet (10 mg total) by mouth daily as needed for allergies. 90 tablet 3  . losartan (COZAAR) 100 MG tablet TAKE 1 TABLET EVERY DAY 90 tablet 1  . omeprazole (PRILOSEC) 40 MG  capsule Take 1 capsule (40 mg total) by mouth daily. 30 capsule 2  . predniSONE (STERAPRED UNI-PAK 48 TAB) 10 MG (48) TBPK tablet     . umeclidinium-vilanterol (ANORO ELLIPTA) 62.5-25 MCG/INH AEPB Inhale 1 puff into the lungs daily. 60 each 2  . vitamin B-12 (CYANOCOBALAMIN) 1000 MCG tablet Take 1,000 mcg by mouth daily.    . Vitamin D, Ergocalciferol, (DRISDOL) 1.25 MG (50000 UT) CAPS capsule Take 1 capsule (50,000 Units total) by mouth every 3 (three) days. 10 capsule 0   No current facility-administered medications on file prior to visit.     PAST MEDICAL HISTORY: Past Medical History:  Diagnosis Date  . ADD (attention deficit disorder)   . Alcohol abuse   . Anemia   . Anxiety   . Aortic atherosclerosis ([place]) 02/20/2018   Chest CT Sept 2019  . Arthritis    rheumatoid arthritis  . Asthma   . Bipolar disorder ([name])   . Centrilobular emphysema ([place]) 02/20/2018   Chest CT Sept 2019  .  Constipation   . Degenerative disc disease at L5-S1 level 09/28/2016   See ortho note May 2018  . Depression    bipolar, hx of suicide attempt  . Drug use   . Dyspnea    with exertion  . GERD (gastroesophageal reflux disease)   . H/O suicide attempt    slit wrists  . Hepatitis C 06/26/2014  . Hip pain   . History of alcohol abuse   . History of diverticulitis 2013  . History of hepatitis C    HEP "C"--three years ago  . History of MRSA infection 2013  . HLD (hyperlipidemia)   . Hypertension   . Hypothyroidism   . Incisional hernia 11/08/2012  . Knee pain   . 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 Multinodular thyroid   . OSA (obstructive sleep apnea)    managed by Dr. Manuella Ghazi  . Osteoporosis   . Post-traumatic osteoarthritis of right knee 09/24/2015  . Prediabetes   . Recurrent ventral hernia 11/08/2012  . Sleep apnea     Dx 3 years ago. Use C-PAP  . 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 Thyroid disease    Goiter  . Vitamin B12 deficiency   . Vitamin D deficiency disease      PAST SURGICAL HISTORY: Past Surgical History:  Procedure Laterality Date  . BILATERAL SALPINGOOPHORECTOMY     due to abnormal mass  . BREAST BIOPSY Left    neg  . BREAST SURGERY Left 20 yrs ago  . CESAREAN SECTION    . COLONOSCOPY    . COLONOSCOPY WITH PROPOFOL N/A 10/16/2017   Procedure: COLONOSCOPY WITH PROPOFOL;  Surgeon: Jonathon Bellows, MD;  Location[facility] ENDOSCOPY;  Service: Gastroenterology;  Laterality: N/A;  . HERNIA REPAIR  06/2011, July 2014   Ventral wall repair with Physiomesh  . TONSILLECTOMY    . TOTAL KNEE ARTHROPLASTY Right 05/10/2016   Procedure: TOTAL KNEE ARTHROPLASTY;  Surgeon: Corky Mull, MD;  Location: [facility] ORS;  Service: Orthopedics;  Laterality: Right;  . TUBAL LIGATION      SOCIAL HISTORY: Social History   Tobacco Use  . Smoking status: Current Every Day Smoker    Packs/day: 0.50    Years: 41.00    Pack years: 20.50    Types: Cigarettes  . Smokeless tobacco: Never Used  Substance Use Topics  . Alcohol use: No    Alcohol/week: 0.0 standard drinks    Comment: sober since October 2018  . 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 Alcohol abuse Father   . Heart attack Father   . Arthritis Mother   . Asthma Mother   . Mental illness Mother   . Thyroid disease Mother   . COPD Mother   . Heart disease Mother   . Congestive Heart Failure Mother   . Alcohol abuse Mother   . Eating disorder Mother   . Arthritis Brother   . Mental illness Brother   . Cancer Brother        non-hodkins lymphoma  . Alcohol abuse Sister   . Drug abuse Sister   . Mental illness Sister   . Mental illness Sister   . Fibromyalgia Sister   . Obesity Sister   . Pneumonia Sister   . Mental illness Sister   . Alcohol abuse Sister   . Drug abuse Sister   .  Diabetes Neg Hx   . Stroke Neg Hx   . Breast cancer Neg Hx     ROS: Review of Systems  Constitutional: Negative  for weight loss.  Endo/Heme/Allergies:       Positive for polyphagia Negative for hypoglycemia  Psychiatric/Behavioral: Positive for depression. Negative for suicidal ideas.    PHYSICAL EXAM: Pt in no acute distress  RECENT LABS AND TESTS: BMET    Component Value Date/Time   NA 129 (L) 12/17/2018 1457   NA 139 07/26/2018 1101   NA 136 04/29/2014 1133   K 4.7 12/17/2018 1457   K 4.2 04/29/2014 1133   CL 97 (L) 12/17/2018 1457   CL 103 04/29/2014 1133   CO2 23 12/17/2018 1457   CO2 30 04/29/2014 1133   GLUCOSE 133 (H) 12/17/2018 1457   GLUCOSE 85 04/29/2014 1133   BUN 17 12/17/2018 1457   BUN 13 07/26/2018 1101   BUN 9 04/29/2014 1133   CREATININE 0.89 12/17/2018 1457   CREATININE 0.79 08/16/2017 0840   CALCIUM 8.7 (L) 12/17/2018 1457   CALCIUM 9.1 04/29/2014 1133   GFRNONAA >60 12/17/2018 1457   GFRNONAA 84 08/16/2017 0840   GFRAA >60 12/17/2018 1457   GFRAA 97 08/16/2017 0840   Lab Results  Component Value Date   HGBA1C 5.4 01/16/2019   HGBA1C 5.9 (H) 07/26/2018   HGBA1C 5.2 08/16/2017   HGBA1C 5.6 10/14/2016   HGBA1C 5.3 04/12/2016   Lab Results  Component Value Date   INSULIN 38.8 (H) 01/16/2019   INSULIN 31.0 (H) 07/26/2018   CBC    Component Value Date/Time   WBC 18.1 (H) 12/17/2018 1457   RBC 4.37 12/17/2018 1457   HGB 13.8 12/17/2018 1457   HGB 15.2 07/26/2018 1101   HCT 40.7 12/17/2018 1457   HCT 43.4 07/26/2018 1101   PLT 247 12/17/2018 1457   PLT 241 10/09/2015 0933   MCV 93.1 12/17/2018 1457   MCV 91 07/26/2018 1101   MCV 91 04/29/2014 1133   MCH 31.6 12/17/2018 1457   MCHC 33.9 12/17/2018 1457   RDW 12.9 12/17/2018 1457   RDW 11.7 07/26/2018 1101   RDW 13.1 04/29/2014 1133   LYMPHSABS 5.1 (H) 07/26/2018 1101   LYMPHSABS 2.3 11/20/2012 1005   MONOABS 390 10/14/2016 0903   MONOABS 0.5 11/20/2012 1005   EOSABS 0.4 07/26/2018 1101   EOSABS 0.2 11/20/2012 1005   BASOSABS 0.1 07/26/2018 1101   BASOSABS 0.1 11/20/2012 1005    Iron/TIBC/Ferritin/ %Sat No results found for: IRON, TIBC, FERRITIN, IRONPCTSAT Lipid Panel     Component Value Date/Time   CHOL 158 07/26/2018 1101   TRIG 134 07/26/2018 1101   HDL 44 07/26/2018 1101   CHOLHDL 3.5 03/06/2018 1144   CHOLHDL 3.5 08/16/2017 0840   VLDL 27 10/14/2016 0903   LDLCALC 87 07/26/2018 1101   LDLCALC 74 08/16/2017 0840   Hepatic Function Panel     Component Value Date/Time   PROT 6.6 12/17/2018 1457   PROT 7.0 07/26/2018 1101   PROT 7.1 03/13/2013 1509   ALBUMIN 4.0 12/17/2018 1457   ALBUMIN 4.7 07/26/2018 1101   ALBUMIN 3.5 03/13/2013 1509   AST 18 12/17/2018 1457   AST 90 (H) 03/13/2013 1509   ALT 19 12/17/2018 1457   ALT 96 (H) 03/13/2013 1509   ALKPHOS 85 12/17/2018 1457   ALKPHOS 110 03/13/2013 1509   BILITOT 0.7 12/17/2018 1457   BILITOT 0.4 07/26/2018 1101   BILITOT 0.6 03/13/2013 1509   BILIDIR <0.1 12/17/2018  1457   IBILI NOT CALCULATED 12/17/2018 1457      Component Value Date/Time   TSH 1.430 07/26/2018 1101   TSH 0.85 08/16/2017 0840   TSH 0.94 10/14/2016 0903     Ref. Range 01/16/2019 08:10  Vitamin D, 25-Hydroxy Latest Ref Range: 30.0 - 100.0 ng/mL 21.8 (L)    I, Doreene Nest, am acting as Location manager for [employer], FNP-C.  I have reviewed the above documentation for accuracy and completeness, and I agree with the above.  - Dashaun Onstott, FNP-C.

## 2019-03-05 ENCOUNTER — Ambulatory Visit (INDEPENDENT_AMBULATORY_CARE_PROVIDER_SITE_OTHER): Payer: Medicare HMO | Admitting: Psychology

## 2019-03-05 DIAGNOSIS — F3289 Other specified depressive episodes: Secondary | ICD-10-CM | POA: Diagnosis not present

## 2019-03-11 ENCOUNTER — Other Ambulatory Visit: Payer: Self-pay | Admitting: Physical Medicine and Rehabilitation

## 2019-03-11 DIAGNOSIS — M5412 Radiculopathy, cervical region: Secondary | ICD-10-CM | POA: Diagnosis not present

## 2019-03-11 DIAGNOSIS — M4802 Spinal stenosis, cervical region: Secondary | ICD-10-CM | POA: Diagnosis not present

## 2019-03-11 DIAGNOSIS — M5416 Radiculopathy, lumbar region: Secondary | ICD-10-CM | POA: Diagnosis not present

## 2019-03-11 DIAGNOSIS — M5030 Other cervical disc degeneration, unspecified cervical region: Secondary | ICD-10-CM | POA: Diagnosis not present

## 2019-03-11 DIAGNOSIS — M19012 Primary osteoarthritis, left shoulder: Secondary | ICD-10-CM | POA: Diagnosis not present

## 2019-03-11 DIAGNOSIS — M25511 Pain in right shoulder: Secondary | ICD-10-CM | POA: Diagnosis not present

## 2019-03-11 DIAGNOSIS — M48062 Spinal stenosis, lumbar region with neurogenic claudication: Secondary | ICD-10-CM | POA: Diagnosis not present

## 2019-03-11 DIAGNOSIS — M25512 Pain in left shoulder: Secondary | ICD-10-CM | POA: Diagnosis not present

## 2019-03-11 DIAGNOSIS — M19011 Primary osteoarthritis, right shoulder: Secondary | ICD-10-CM | POA: Diagnosis not present

## 2019-03-11 DIAGNOSIS — M5136 Other intervertebral disc degeneration, lumbar region: Secondary | ICD-10-CM | POA: Diagnosis not present

## 2019-03-17 ENCOUNTER — Other Ambulatory Visit: Payer: Self-pay

## 2019-03-17 ENCOUNTER — Ambulatory Visit
Admission: RE | Admit: 2019-03-17 | Discharge: 2019-03-17 | Disposition: A | Discharge status: Home / Self Care | Payer: Medicare HMO | Source: Ambulatory Visit | Attending: Physical Medicine and Rehabilitation | Admitting: Physical Medicine and Rehabilitation

## 2019-03-17 DIAGNOSIS — M5412 Radiculopathy, cervical region: Secondary | ICD-10-CM | POA: Diagnosis not present

## 2019-03-17 DIAGNOSIS — M4312 Spondylolisthesis, cervical region: Secondary | ICD-10-CM | POA: Diagnosis not present

## 2019-03-20 ENCOUNTER — Other Ambulatory Visit: Payer: Self-pay

## 2019-03-20 ENCOUNTER — Ambulatory Visit (INDEPENDENT_AMBULATORY_CARE_PROVIDER_SITE_OTHER): Payer: Medicare HMO | Admitting: Family Medicine

## 2019-03-20 ENCOUNTER — Encounter (INDEPENDENT_AMBULATORY_CARE_PROVIDER_SITE_OTHER): Payer: Self-pay | Admitting: Family Medicine

## 2019-03-20 VITALS — BP 123/76 | HR 67 | Temp 97.8°F | Ht 63.0 in | Wt 221.0 lb

## 2019-03-20 DIAGNOSIS — Z6839 Body mass index (BMI) 39.0-39.9, adult: Secondary | ICD-10-CM | POA: Diagnosis not present

## 2019-03-20 DIAGNOSIS — E559 Vitamin D deficiency, unspecified: Secondary | ICD-10-CM | POA: Diagnosis not present

## 2019-03-20 DIAGNOSIS — E8881 Metabolic syndrome: Secondary | ICD-10-CM | POA: Diagnosis not present

## 2019-03-20 MED ORDER — VITAMIN D (ERGOCALCIFEROL) 1.25 MG (50000 UNIT) PO CAPS: 50000.0000 [IU] | ORAL_CAPSULE | ORAL | 0 refills | Status: DC

## 2019-03-20 NOTE — Progress Notes (Signed)
Office: [phone]  /  Fax: [phone]   HPI:   Chief Complaint: Susan [name] is here to discuss her progress with her obesity treatment plan. She is on the Category 2 plan and is following her eating plan approximately 75 % of the time. She states she is exercising 0 minutes 0 times per week. Susan [name] got a partial dental plate and she is choosing soft proteins. She reports possibly not getting enough protein, but she is working on it. She does make cakes to take to [name] meetings, but she does not eat any. She has three adult kids who struggle with substance abuse but she is still doing well on the plan despite the stress she endures. Her weight is 221 lb (100.2 kg) today and has had a weight loss of 2 pounds since her last in-office visit. She has lost 5 lbs since starting treatment with [name].  Insulin Resistance Susan [name] has a diagnosis of insulin resistance based on her elevated fasting insulin level >5. Although Susan [name]'s blood glucose readings are still under good control, insulin resistance puts her at greater risk of metabolic syndrome and diabetes. Tiela feels victoza helps with appetite. She denies nausea or constipation. She continues to work on diet and exercise to decrease risk of diabetes. Mialyn denies polyphagia. Lab Results  Component Value Date   HGBA1C 5.4 01/16/2019    Vitamin D deficiency Susan [name] has a diagnosis of vitamin D deficiency. Her last vitamin D level was at 21.8 and was not at goal. Susan [name] is currently taking vit D and she denies nausea, vomiting or muscle weakness.  ASSESSMENT AND PLAN:  Insulin resistance  Vitamin D deficiency - Plan: Vitamin D, Ergocalciferol, (DRISDOL) 1.25 MG (50000 UT) CAPS capsule  Class 2 severe obesity with serious comorbidity and body mass index (BMI) of 39.0 to 39.9 in adult, unspecified obesity type ([place])  PLAN:  Insulin Resistance Susan [name] will continue to work on weight loss, exercise, and decreasing simple carbohydrates in  her diet to help decrease the risk of diabetes. She was informed that eating too many simple carbohydrates or too many calories at one sitting increases the likelihood of GI side effects. Susan [name] will continue victoza 1.8 mg subQ daily (no prescription needed) and follow up with [name] as directed to monitor her progress.  Vitamin D Deficiency Susan [name] was informed that low vitamin D levels contributes to fatigue and are associated with obesity, breast, and colon cancer. Susan [name] agrees to continue to take prescription Vit D @50 ,000 IU every 3 days #10 with no refills and she will follow up for routine testing of vitamin D, at least 2-3 times per year. She was informed of the risk of over-replacement of vitamin D and agrees to not increase her dose unless she discusses this with [name] first. [name] agrees to follow up with our clinic in 3 weeks.  Obesity Susan [name] is currently in the action stage of change. As such, her goal is to continue with weight loss efforts She has agreed to follow the Category 2 plan We discussed the following Behavioral Modification Strategies today: planning for success, better snacking choices, increasing lean protein intake and holiday eating strategies   Handout for holiday tips was given to patient today.  Susan [name] has agreed to follow up with our clinic in 3 weeks. She was informed of the importance of frequent follow up visits to maximize her success with intensive lifestyle modifications for her multiple health conditions.  ALLERGIES: Allergies  Allergen Reactions  . Hydrochlorothiazide Other (See  Comments)    Electrolyte imbalance  . Lasix [Furosemide]     Electrolyte imbalance    MEDICATIONS: Current Outpatient Medications on File Prior to Visit  Medication Sig Dispense Refill  . albuterol (PROVENTIL HFA;VENTOLIN HFA) 108 (90 Base) MCG/ACT inhaler INHALE 1 TO 2 PUFFS EVERY 4 HOURS AS NEEDED FOR COUGH, WHEEZING, AND SHORTNESS OF BREATH 18 g 2  . amLODipine (NORVASC) 5 MG  tablet TAKE 1 TABLET (5 MG TOTAL) BY MOUTH DAILY. 90 tablet 1  . atenolol (TENORMIN) 25 MG tablet TAKE 1 TABLET EVERY DAY 90 tablet 1  . atorvastatin (LIPITOR) 10 MG tablet TAKE 1 TABLET (10 MG TOTAL) BY MOUTH AT BEDTIME. 90 tablet 1  . buPROPion (WELLBUTRIN SR) 200 MG 12 hr tablet Take 1 tablet (200 mg total) by mouth daily. 90 tablet 0  . clotrimazole (LOTRIMIN) 1 % cream Apply 1 application topically 2 (two) times daily. (Patient taking differently: Apply 1 application topically as needed. ) 30 g 0  . cyclobenzaprine (FLEXERIL) 5 MG tablet Take 5 mg by mouth at bedtime.     . DULoxetine (CYMBALTA) 60 MG capsule TAKE 1 CAPSULE (60 MG TOTAL) BY MOUTH 2 (TWO) TIMES DAILY. 180 capsule 1  . gabapentin (NEURONTIN) 300 MG capsule Take 600 mg by mouth 2 (two) times daily.     Susan [name] Kitchen HYDROcodone-acetaminophen (NORCO/VICODIN) 5-325 MG tablet One-half to one pill by mouth daily PRN ([facility])    . Insulin Pen Needle 32G X 4 MM MISC Use 1 needle daily to inject [name]. 100 each 0  . liraglutide ([name]) 18 MG/3ML SOPN Inject 0.3 mLs (1.8 mg total) into the skin daily. 3 pen 0  . loratadine (CLARITIN) 10 MG tablet Take 1 tablet (10 mg total) by mouth daily as needed for allergies. 90 tablet 3  . losartan (COZAAR) 100 MG tablet TAKE 1 TABLET EVERY DAY 90 tablet 1  . omeprazole (PRILOSEC) 40 MG capsule Take 1 capsule (40 mg total) by mouth daily. 30 capsule 2  . topiramate (TOPAMAX) 25 MG tablet Take 1 tablet (25 mg total) by mouth daily. 30 tablet 0  . umeclidinium-vilanterol (ANORO ELLIPTA) 62.5-25 MCG/INH AEPB Inhale 1 puff into the lungs daily. 60 each 2  . vitamin B-12 (CYANOCOBALAMIN) 1000 MCG tablet Take 1,000 mcg by mouth daily.    . meloxicam (MOBIC) 15 MG tablet Take 15 mg by mouth daily.     No current facility-administered medications on file prior to visit.     PAST MEDICAL HISTORY: Past Medical History:  Diagnosis Date  . ADD (attention deficit disorder)   . Alcohol abuse   . Anemia   .  Anxiety   . Aortic atherosclerosis ([place]) 02/20/2018   Chest CT Sept 2019  . Arthritis    rheumatoid arthritis  . Asthma   . Bipolar disorder ([place])   . Centrilobular emphysema ([place]) 02/20/2018   Chest CT Sept 2019  . Constipation   . Degenerative disc disease at L5-S1 level 09/28/2016   See ortho note May 2018  . Depression    bipolar, hx of suicide attempt  . Drug use   . Dyspnea    with exertion  . GERD (gastroesophageal reflux disease)   . H/O suicide attempt    slit wrists  . Hepatitis C 06/26/2014  . Hip pain   . History of alcohol abuse   . History of diverticulitis 2013  . History of hepatitis C    HEP "C"--three years ago  . History of MRSA infection  2013  . HLD (hyperlipidemia)   . Hypertension   . Hypothyroidism   . Incisional hernia 11/08/2012  . Knee pain   . 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 Multinodular thyroid   . OSA (obstructive sleep apnea)    managed by Dr. Manuella Ghazi  . Osteoporosis   . Post-traumatic osteoarthritis of right knee 09/24/2015  . Prediabetes   . Recurrent ventral hernia 11/08/2012  . Sleep apnea     Dx 3 years ago. Use C-PAP  . 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 Thyroid disease    Goiter  . Vitamin B12 deficiency   . Vitamin D deficiency disease     PAST SURGICAL HISTORY: Past Surgical History:  Procedure Laterality Date  . BILATERAL SALPINGOOPHORECTOMY     due to abnormal mass  . BREAST BIOPSY Left    neg  . BREAST SURGERY Left 20 yrs ago  . CESAREAN SECTION    . COLONOSCOPY    . COLONOSCOPY WITH PROPOFOL N/A 10/16/2017   Procedure: COLONOSCOPY WITH PROPOFOL;  Surgeon: Jonathon Bellows, MD;  Location: [facility] ENDOSCOPY;  Service: Gastroenterology;  Laterality: N/A;  . HERNIA REPAIR  06/2011, July 2014   Ventral wall repair with Physiomesh  . TONSILLECTOMY    . TOTAL KNEE ARTHROPLASTY Right 05/10/2016   Procedure: TOTAL KNEE ARTHROPLASTY;  Surgeon: Corky Mull, MD;  Location: [facility] ORS;  Service: Orthopedics;  Laterality:  Right;  . TUBAL LIGATION      SOCIAL HISTORY: Social History   Tobacco Use  . Smoking status: Current Every Day Smoker    Packs/day: 0.50    Years: 41.00    Pack years: 20.50    Types: Cigarettes  . Smokeless tobacco: Never Used  Substance Use Topics  . Alcohol use: No    Alcohol/week: 0.0 standard drinks    Comment: sober since October 2018  . 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 Alcohol abuse Father   . Heart attack Father   . Arthritis Mother   . Asthma Mother   . Mental illness Mother   . Thyroid disease Mother   . COPD Mother   . Heart disease Mother   . Congestive Heart Failure Mother   . Alcohol abuse Mother   . Eating disorder Mother   . Arthritis Brother   . Mental illness Brother   . Cancer Brother        non-hodkins lymphoma  . Alcohol abuse Sister   . Drug abuse Sister   . Mental illness Sister   . Mental illness Sister   . Fibromyalgia Sister   . Obesity Sister   . Pneumonia Sister   . Mental illness Sister   . Alcohol abuse Sister   . Drug abuse Sister   . Diabetes Neg Hx   . Stroke Neg Hx   . Breast cancer Neg Hx     ROS: Review of Systems  Constitutional: Positive for weight loss.  Gastrointestinal: Negative for constipation, nausea and vomiting.  Musculoskeletal:       Negative for muscle weakness  Endo/Heme/Allergies:       Negative for polyphagia    PHYSICAL EXAM: Blood pressure 123/76, pulse 67, temperature 97.8 F (36.6 C), temperature source Oral, height 5\' 3"  (1.6 m), weight 221 lb (100.2 kg), SpO2 99 %. Body mass index is 39.15 kg/m. Physical Exam Vitals  signs reviewed.  Constitutional:      Appearance: Normal appearance. She is well-developed. She is obese.  Cardiovascular:     Rate and Rhythm: Normal rate.  Pulmonary:     Effort: Pulmonary effort is normal.  Musculoskeletal: Normal range of motion.  Skin:     General: Skin is warm and dry.  Neurological:     Mental Status: She is alert and oriented to person, place, and time.  Psychiatric:        Mood and Affect: Mood normal.        Behavior: Behavior normal.     RECENT LABS AND TESTS: BMET    Component Value Date/Time   NA 129 (L) 12/17/2018 1457   NA 139 07/26/2018 1101   NA 136 04/29/2014 1133   K 4.7 12/17/2018 1457   K 4.2 04/29/2014 1133   CL 97 (L) 12/17/2018 1457   CL 103 04/29/2014 1133   CO2 23 12/17/2018 1457   CO2 30 04/29/2014 1133   GLUCOSE 133 (H) 12/17/2018 1457   GLUCOSE 85 04/29/2014 1133   BUN 17 12/17/2018 1457   BUN 13 07/26/2018 1101   BUN 9 04/29/2014 1133   CREATININE 0.89 12/17/2018 1457   CREATININE 0.79 08/16/2017 0840   CALCIUM 8.7 (L) 12/17/2018 1457   CALCIUM 9.1 04/29/2014 1133   GFRNONAA >60 12/17/2018 1457   GFRNONAA 84 08/16/2017 0840   GFRAA >60 12/17/2018 1457   GFRAA 97 08/16/2017 0840   Lab Results  Component Value Date   HGBA1C 5.4 01/16/2019   HGBA1C 5.9 (H) 07/26/2018   HGBA1C 5.2 08/16/2017   HGBA1C 5.6 10/14/2016   HGBA1C 5.3 04/12/2016   Lab Results  Component Value Date   INSULIN 38.8 (H) 01/16/2019   INSULIN 31.0 (H) 07/26/2018   CBC    Component Value Date/Time   WBC 18.1 (H) 12/17/2018 1457   RBC 4.37 12/17/2018 1457   HGB 13.8 12/17/2018 1457   HGB 15.2 07/26/2018 1101   HCT 40.7 12/17/2018 1457   HCT 43.4 07/26/2018 1101   PLT 247 12/17/2018 1457   PLT 241 10/09/2015 0933   MCV 93.1 12/17/2018 1457   MCV 91 07/26/2018 1101   MCV 91 04/29/2014 1133   MCH 31.6 12/17/2018 1457   MCHC 33.9 12/17/2018 1457   RDW 12.9 12/17/2018 1457   RDW 11.7 07/26/2018 1101   RDW 13.1 04/29/2014 1133   LYMPHSABS 5.1 (H) 07/26/2018 1101   LYMPHSABS 2.3 11/20/2012 1005   MONOABS 390 10/14/2016 0903   MONOABS 0.5 11/20/2012 1005   EOSABS 0.4 07/26/2018 1101   EOSABS 0.2 11/20/2012 1005   BASOSABS 0.1 07/26/2018 1101   BASOSABS 0.1 11/20/2012 1005   Iron/TIBC/Ferritin/  %Sat No results found for: IRON, TIBC, FERRITIN, IRONPCTSAT Lipid Panel     Component Value Date/Time   CHOL 158 07/26/2018 1101   TRIG 134 07/26/2018 1101   HDL 44 07/26/2018 1101   CHOLHDL 3.5 03/06/2018 1144   CHOLHDL 3.5 08/16/2017 0840   VLDL 27 10/14/2016 0903   LDLCALC 87 07/26/2018 1101   LDLCALC 74 08/16/2017 0840   Hepatic Function Panel     Component Value Date/Time   PROT 6.6 12/17/2018 1457   PROT 7.0 07/26/2018 1101   PROT 7.1 03/13/2013 1509   ALBUMIN 4.0 12/17/2018 1457   ALBUMIN 4.7 07/26/2018 1101   ALBUMIN 3.5 03/13/2013 1509   AST 18 12/17/2018 1457   AST 90 (H) 03/13/2013 1509   ALT 19 12/17/2018 1457   ALT  96 (H) 03/13/2013 1509   ALKPHOS 85 12/17/2018 1457   ALKPHOS 110 03/13/2013 1509   BILITOT 0.7 12/17/2018 1457   BILITOT 0.4 07/26/2018 1101   BILITOT 0.6 03/13/2013 1509   BILIDIR <0.1 12/17/2018 1457   IBILI NOT CALCULATED 12/17/2018 1457      Component Value Date/Time   TSH 1.430 07/26/2018 1101   TSH 0.85 08/16/2017 0840   TSH 0.94 10/14/2016 0903    Results for ANNELIS, SPEAKER (MRN [id number]) as of 03/20/2019 13:35  Ref. Range 01/16/2019 08:10  Vitamin D, 25-Hydroxy Latest Ref Range: 30.0 - 100.0 ng/mL 21.8 (L)    OBESITY BEHAVIORAL INTERVENTION VISIT  Today's visit was # 14   Starting weight: 226 lbs Starting date: 07/26/2018 Today's weight : 221 lbs Today's date: 03/20/2019 Total lbs lost to date: 5    03/20/2019  Height 5\' 3"  (1.6 m)  Weight 221 lb (100.2 kg)  BMI (Calculated) 39.16  BLOOD PRESSURE - SYSTOLIC [id number]  BLOOD PRESSURE - DIASTOLIC 76   Body Fat % 123456 %    ASK: We discussed the diagnosis of obesity with [place] today and Shawana agreed to give [name] permission to discuss obesity behavioral modification therapy today.  ASSESS: [name] has the diagnosis of obesity and her BMI today is 39.16 Denyce is in the action stage of change   ADVISE: Kayren was educated on the multiple health risks of obesity  as well as the benefit of weight loss to improve her health. She was advised of the need for long term treatment and the importance of lifestyle modifications to improve her current health and to decrease her risk of future health problems.  AGREE: Multiple dietary modification options and treatment options were discussed and  Harlene agreed to follow the recommendations documented in the above note.  ARRANGE: Adabelle was educated on the importance of frequent visits to treat obesity as outlined per CMS and USPSTF guidelines and agreed to schedule her next follow up appointment today.  I, Doreene Nest, am acting as transcriptionist for [employer], FNP-C  I have reviewed the above documentation for accuracy and completeness, and I agree with the above.  - Jenkins Risdon, FNP-C.

## 2019-03-21 ENCOUNTER — Encounter (INDEPENDENT_AMBULATORY_CARE_PROVIDER_SITE_OTHER): Payer: Self-pay | Admitting: Family Medicine

## 2019-03-21 ENCOUNTER — Ambulatory Visit (INDEPENDENT_AMBULATORY_CARE_PROVIDER_SITE_OTHER): Payer: Medicare HMO | Admitting: Psychology

## 2019-03-21 DIAGNOSIS — F3289 Other specified depressive episodes: Secondary | ICD-10-CM

## 2019-03-27 ENCOUNTER — Other Ambulatory Visit: Payer: Self-pay | Admitting: Family Medicine

## 2019-03-27 DIAGNOSIS — I10 Essential (primary) hypertension: Secondary | ICD-10-CM

## 2019-04-01 ENCOUNTER — Other Ambulatory Visit: Payer: Self-pay | Admitting: Family Medicine

## 2019-04-01 NOTE — Telephone Encounter (Signed)
Pt called running short on her Rx for Cymbalta 60 mg.  She is completely out and di dnot get her refill by mail order.  She would like that rx sent to [facility].  She can not go wothout this medication. Best#  [phone] leave a voice mail if she does not pick up

## 2019-04-02 ENCOUNTER — Other Ambulatory Visit: Payer: Self-pay

## 2019-04-02 DIAGNOSIS — J432 Centrilobular emphysema: Secondary | ICD-10-CM

## 2019-04-02 MED ORDER — ANORO ELLIPTA 62.5-25 MCG/INH IN AEPB: 1.0000 | INHALATION_SPRAY | Freq: Every day | RESPIRATORY_TRACT | 2 refills | Status: DC

## 2019-04-02 MED ORDER — DULOXETINE HCL 60 MG PO CPEP: 60.0000 mg | ORAL_CAPSULE | Freq: Two times a day (BID) | ORAL | 1 refills | Status: DC

## 2019-04-02 NOTE — Telephone Encounter (Signed)
Patient calling back to check status of this request. Patient states she has been out of this medication since Sunday.

## 2019-04-08 ENCOUNTER — Ambulatory Visit (INDEPENDENT_AMBULATORY_CARE_PROVIDER_SITE_OTHER): Payer: Medicare HMO | Admitting: Family Medicine

## 2019-04-08 ENCOUNTER — Encounter: Payer: Self-pay | Admitting: Family Medicine

## 2019-04-08 ENCOUNTER — Other Ambulatory Visit: Payer: Self-pay

## 2019-04-08 VITALS — BP 128/78 | HR 89 | Temp 98.5°F | Resp 14 | Ht 63.0 in | Wt 223.9 lb

## 2019-04-08 DIAGNOSIS — Z5181 Encounter for therapeutic drug level monitoring: Secondary | ICD-10-CM | POA: Diagnosis not present

## 2019-04-08 DIAGNOSIS — I700 Atherosclerosis of aorta: Secondary | ICD-10-CM | POA: Diagnosis not present

## 2019-04-08 DIAGNOSIS — E785 Hyperlipidemia, unspecified: Secondary | ICD-10-CM | POA: Diagnosis not present

## 2019-04-08 DIAGNOSIS — M48062 Spinal stenosis, lumbar region with neurogenic claudication: Secondary | ICD-10-CM | POA: Diagnosis not present

## 2019-04-08 DIAGNOSIS — J432 Centrilobular emphysema: Secondary | ICD-10-CM

## 2019-04-08 DIAGNOSIS — M5136 Other intervertebral disc degeneration, lumbar region: Secondary | ICD-10-CM | POA: Diagnosis not present

## 2019-04-08 DIAGNOSIS — M5416 Radiculopathy, lumbar region: Secondary | ICD-10-CM | POA: Diagnosis not present

## 2019-04-08 DIAGNOSIS — Z1231 Encounter for screening mammogram for malignant neoplasm of breast: Secondary | ICD-10-CM

## 2019-04-08 DIAGNOSIS — Z23 Encounter for immunization: Secondary | ICD-10-CM

## 2019-04-08 DIAGNOSIS — I10 Essential (primary) hypertension: Secondary | ICD-10-CM

## 2019-04-08 DIAGNOSIS — F3170 Bipolar disorder, currently in remission, most recent episode unspecified: Secondary | ICD-10-CM

## 2019-04-08 DIAGNOSIS — R7303 Prediabetes: Secondary | ICD-10-CM

## 2019-04-08 DIAGNOSIS — M5030 Other cervical disc degeneration, unspecified cervical region: Secondary | ICD-10-CM | POA: Diagnosis not present

## 2019-04-08 DIAGNOSIS — M5412 Radiculopathy, cervical region: Secondary | ICD-10-CM | POA: Diagnosis not present

## 2019-04-08 MED ORDER — ALBUTEROL SULFATE HFA 108 (90 BASE) MCG/ACT IN AERS: INHALATION_SPRAY | RESPIRATORY_TRACT | 2 refills | Status: DC

## 2019-04-08 MED ORDER — ANORO ELLIPTA 62.5-25 MCG/INH IN AEPB: 1.0000 | INHALATION_SPRAY | Freq: Every day | RESPIRATORY_TRACT | 2 refills | Status: DC

## 2019-04-08 NOTE — Progress Notes (Signed)
Name: Susan [name]   MRN: [id number]    DOB: 20-Aug-1960   Date:04/08/2019       Progress Note  Chief Complaint  Patient presents with  . Follow-up  . Skin Problem    spot/sore on right upper arm, been there for several months  . Medication Refill  . Depression  . Hyperlipidemia  . Hypertension  . Asthma    Subjective:   Susan [name] is a 58 y.o. female, presents to clinic for routine follow up on the conditions listed above.  Hypertension:  Currently managed on amlodipine 5 mg, atenolol 25 mg (takes at night), losartan 100 mg Pt reports good med compliance and denies any SE.  No lightheadedness, hypotension, syncope. Blood pressure today is elevated . BP Readings from Last 3 Encounters:  04/08/19 (!) 142/78  03/20/19 123/76  02/15/19 130/70  Pt denies CP, SOB, exertional sx, LE edema, palpitation, Ha's, visual disturbances Diet - watching her salt - goes to healthy weight and wellness  GERD: Acid reflux, indigestion is well controlled with prilosec 40 mg daily, when she runs out she gets sx, denies N, V, bloating, abd pain, melena, hematochezia  Right upper outer arm lesion been there since July after a bug bite, raised dry, sore, sometimes gets dry crusty that peels, sometimes it will raise up and other times will get flatter.  - she wants derm referral to get it taken off  Obesity:  Going to medical weight management Wt Readings from Last 10 Encounters:  04/08/19 223 lb 14.4 oz (101.6 kg)  03/20/19 221 lb (100.2 kg)  02/15/19 230 lb 8 oz (104.6 kg)  02/12/19 223 lb (101.2 kg)  02/06/19 227 lb 6.4 oz (103.1 kg)  01/16/19 231 lb 9.6 oz (105.1 kg)  01/08/19 228 lb 6.4 oz (103.6 kg)  01/04/19 233 lb 1.6 oz (105.7 kg)  12/17/18 226 lb (102.5 kg)  08/23/18 225 lb 8 oz (102.3 kg)   BMI Readings from Last 5 Encounters:  04/08/19 39.66 kg/m  03/20/19 39.15 kg/m  02/15/19 42.16 kg/m  02/12/19 39.50 kg/m  02/06/19 40.28 kg/m  She has worked on her diet a  lot, Cut out a lot of sweets She is on victoza and topiramate, managed by specialist Not on wellbutrin for weight loss did not tolerate  MDD/mood disorder (chart notes bipolar) Cymbalta 60 mg BID, compliant with meds, no SE.  We discussed high dose and signs of seratonin syndrome.  She feels like her mood is good, she is terrified to withdrawal from it, at this time does not want to change meds /dose at all.  She is doing well with AA, 2 years sober. Depression screen [facility] 2/9 04/08/2019 01/04/2019 09/06/2018  Decreased Interest 0 1 1  Down, Depressed, Hopeless 1 2 1   PHQ - 2 Score 1 3 2   Altered sleeping 0 2 0  Tired, decreased energy 0 2 1  Change in appetite 0 1 1  Feeling bad or failure about yourself  0 1 1  Trouble concentrating 0 0 2  Moving slowly or fidgety/restless 0 0 0  Suicidal thoughts 0 0 0  PHQ-9 Score 1 9 7   Difficult doing work/chores Not difficult at all Somewhat difficult -  Some recent data might be hidden   Emphysema per CT scan 2019 (low dose CT screening for lung cancer)- she reports dx of asthma - she is on anoro and albuterol - she states they are helping her symptoms, she does feels like her  weight is part of her SOB, she uses the albuterol inhaler more frequently with triggers like muggy weather or her anxiety, overall using it about 2-3 x a week.  No nighttime waking, uses CPAP No recent asthma exacerbation or COPD exacerbation requiring meds/steroids/hospitalization She denies any worsening SOB/DOE, cough, wheeze, CP, night sweats, unintentional weight loss She would like to do her annual Low dose CT - doesn't feel like she will have time in December, but wants to get in contact with team to arrange - agrees to me sending [facility] to [facility] to f/up, overdue for annual scan Needs refills today on inhalers  Prediabetes - on meds as noted above, has lost 10 lbs, and is very active, never on meds Lab Results  Component Value Date   HGBA1C 5.4 01/16/2019     Hyperlipidemia: Current Medication Regimen:  lipitor 10 mg  Last Lipids: Lab Results  Component Value Date   CHOL 158 07/26/2018   HDL 44 07/26/2018   LDLCALC 87 07/26/2018   TRIG 134 07/26/2018   CHOLHDL 3.5 03/06/2018  - Current Diet:  healthy - Denies: Chest pain, shortness of breath, myalgias. - Documented aortic atherosclerosis? Yes - Risk factors for atherosclerosis: diabetes mellitus, hypercholesterolemia, hypertension and smoking    Patient Active Problem List   Diagnosis Date Noted  . Insulin resistance 02/18/2019  . Prediabetes 12/31/2018  . Depression 10/23/2018  . Class 2 severe obesity with serious comorbidity and body mass index (BMI) of 39.0 to 39.9 in adult ([place]) 10/23/2018  . Allergic rhinitis 08/23/2018  . Aortic atherosclerosis ([place]) 02/20/2018  . Centrilobular emphysema ([place]) 02/20/2018  . Dyslipidemia 03/28/2017  . Cocaine use disorder ([place]) 02/27/2017  . Degenerative disc disease at L5-S1 level 09/28/2016  . GERD (gastroesophageal reflux disease) 12/17/2015  . Chronic back pain 10/09/2015  . Post-traumatic osteoarthritis of right knee 09/24/2015  . Morbid obesity ([place]) 08/06/2015  . Alcoholism in recovery ([place]) 04/13/2015  . Vitamin D deficiency 04/09/2015  . Vitamin B12 deficiency 04/09/2015  . OSA on CPAP 04/09/2015  . Subclinical hyperthyroidism 06/26/2014  . Toxic multinodular goiter 06/26/2014  . Hypertension 06/26/2014  . Bipolar disorder ([place]) 06/26/2014  . Lumbar radiculitis 10/03/2013  . Diverticulosis 08/24/2013    Past Surgical History:  Procedure Laterality Date  . BILATERAL SALPINGOOPHORECTOMY     due to abnormal mass  . BREAST BIOPSY Left    neg  . BREAST SURGERY Left 20 yrs ago  . CESAREAN SECTION    . COLONOSCOPY    . COLONOSCOPY WITH PROPOFOL N/A 10/16/2017   Procedure: COLONOSCOPY WITH PROPOFOL;  Surgeon: Jonathon Bellows, MD;  Location[facility] ENDOSCOPY;  Service: Gastroenterology;  Laterality: N/A;  . HERNIA REPAIR  06/2011,  July 2014   Ventral wall repair with Physiomesh  . TONSILLECTOMY    . TOTAL KNEE ARTHROPLASTY Right 05/10/2016   Procedure: TOTAL KNEE ARTHROPLASTY;  Surgeon: Corky Mull, MD;  Location: [facility] ORS;  Service: Orthopedics;  Laterality: Right;  . TUBAL LIGATION      Family History  Problem Relation Age of Onset  . Alcohol abuse Father   . Heart attack Father   . Arthritis Mother   . Asthma Mother   . Mental illness Mother   . Thyroid disease Mother   . COPD Mother   . Heart disease Mother   . Congestive Heart Failure Mother   . Alcohol abuse Mother   . Eating disorder Mother   . Arthritis Brother   . Mental illness Brother   .  Cancer Brother        non-hodkins lymphoma  . Alcohol abuse Sister   . Drug abuse Sister   . Mental illness Sister   . Mental illness Sister   . Fibromyalgia Sister   . Obesity Sister   . Pneumonia Sister   . Mental illness Sister   . Alcohol abuse Sister   . Drug abuse Sister   . Diabetes Neg Hx   . Stroke Neg Hx   . Breast cancer Neg Hx     Social History   Socioeconomic History  . Marital status: Widowed    Spouse name: Not on file  . Number of children: Not on file  . Years of education: Not on file  . Highest education level: Some college, no degree  Occupational History  . Occupation: disabled  Social Needs  . Financial resource strain: Not hard at all  . Food insecurity    Worry: Never true    Inability: Never true  . Transportation needs    Medical: No    Non-medical: No  Tobacco Use  . Smoking status: Current Every Day Smoker    Packs/day: 0.50    Years: 41.00    Pack years: 20.50    Types: Cigarettes  . Smokeless tobacco: Never Used  Substance and Sexual Activity  . Alcohol use: No    Alcohol/week: 0.0 standard drinks    Comment: sober since October 2018  . 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 Sexual activity: Not Currently  Lifestyle  . Physical activity    Days per week: 7 days     Minutes per session: 30 min  . Stress: To some extent  Relationships  . Social connections    Talks on phone: More than three times a week    Gets together: Three times a week    Attends religious service: More than 4 times per year    Active member of club or organization: No    Attends meetings of clubs or organizations: Never    Relationship status: Widowed  . Intimate partner violence    Fear of current or ex partner: No    Emotionally abused: No    Physically abused: No    Forced sexual activity: No  Other Topics Concern  . Not on file  Social History Narrative  . Not on file     Current Outpatient Medications:  .  albuterol (PROVENTIL HFA;VENTOLIN HFA) 108 (90 Base) MCG/ACT inhaler, INHALE 1 TO 2 PUFFS EVERY 4 HOURS AS NEEDED FOR COUGH, WHEEZING, AND SHORTNESS OF BREATH, Disp: 18 g, Rfl: 2 .  amLODipine (NORVASC) 5 MG tablet, TAKE 1 TABLET EVERY DAY, Disp: 90 tablet, Rfl: 1 .  atenolol (TENORMIN) 25 MG tablet, TAKE 1 TABLET EVERY DAY, Disp: 90 tablet, Rfl: 1 .  atorvastatin (LIPITOR) 10 MG tablet, TAKE 1 TABLET AT BEDTIME, Disp: 90 tablet, Rfl: 1 .  clotrimazole (LOTRIMIN) 1 % cream, Apply 1 application topically 2 (two) times daily. (Patient taking differently: Apply 1 application topically as needed. ), Disp: 30 g, Rfl: 0 .  cyclobenzaprine (FLEXERIL) 5 MG tablet, Take 5 mg by mouth at bedtime. , Disp: , Rfl:  .  DULoxetine (CYMBALTA) 60 MG capsule, Take 1 capsule (60 mg total) by mouth 2 (two) times daily., Disp: 180 capsule, Rfl: 1 .  gabapentin (NEURONTIN) 300 MG capsule, Take 600 mg by mouth 2 (two) times daily. , Disp: , Rfl:  .  HYDROcodone-acetaminophen (NORCO/VICODIN) 5-325 MG tablet, One-half to one pill by mouth daily PRN ([facility]), Disp: , Rfl:  .  liraglutide ([name]) 18 MG/3ML SOPN, Inject 0.3 mLs (1.8 mg total) into the skin daily., Disp: 3 pen, Rfl: 0 .  loratadine (CLARITIN) 10 MG tablet, Take 1 tablet (10 mg total) by mouth daily as needed for allergies.,  Disp: 90 tablet, Rfl: 3 .  losartan (COZAAR) 100 MG tablet, TAKE 1 TABLET EVERY DAY, Disp: 90 tablet, Rfl: 1 .  meloxicam (MOBIC) 15 MG tablet, Take 15 mg by mouth daily., Disp: , Rfl:  .  omeprazole (PRILOSEC) 40 MG capsule, Take 1 capsule (40 mg total) by mouth daily., Disp: 30 capsule, Rfl: 2 .  topiramate (TOPAMAX) 25 MG tablet, Take 1 tablet (25 mg total) by mouth daily., Disp: 30 tablet, Rfl: 0 .  umeclidinium-vilanterol (ANORO ELLIPTA) 62.5-25 MCG/INH AEPB, Inhale 1 puff into the lungs daily., Disp: 60 each, Rfl: 2 .  vitamin B-12 (CYANOCOBALAMIN) 1000 MCG tablet, Take 1,000 mcg by mouth daily., Disp: , Rfl:  .  Vitamin D, Ergocalciferol, (DRISDOL) 1.25 MG (50000 UT) CAPS capsule, Take 1 capsule (50,000 Units total) by mouth every 3 (three) days., Disp: 10 capsule, Rfl: 0 .  buPROPion (WELLBUTRIN SR) 200 MG 12 hr tablet, Take 1 tablet (200 mg total) by mouth daily. (Patient not taking: Reported on 04/08/2019), Disp: 90 tablet, Rfl: 0 .  Insulin Pen Needle 32G X 4 MM MISC, Use 1 needle daily to inject [name]., Disp: 100 each, Rfl: 0  Allergies  Allergen Reactions  . Hydrochlorothiazide Other (See Comments)    Electrolyte imbalance  . Lasix [Furosemide]     Electrolyte imbalance   I personally reviewed active problem list, medication list, allergies, family history, social history, health maintenance, notes from last encounter, lab results, imaging with the patient/caregiver today.  Review of Systems  Constitutional: Negative.   HENT: Negative.   Eyes: Negative.   Respiratory: Negative.   Cardiovascular: Negative.   Gastrointestinal: Negative.   Endocrine: Negative.   Genitourinary: Negative.   Musculoskeletal: Negative.   Skin: Negative.   Allergic/Immunologic: Negative.   Neurological: Negative.   Hematological: Negative.   Psychiatric/Behavioral: Negative.   All other systems reviewed and are negative.      Objective:    Vitals:   04/08/19 0901 04/08/19 0941  BP:  (!) 142/78 128/78  Pulse: 89   Resp: 14   Temp: 98.5 F (36.9 C)   SpO2: 97%   Weight: 223 lb 14.4 oz (101.6 kg)   Height: 5\' 3"  (1.6 m)     Body mass index is 39.66 kg/m.  Physical Exam Vitals signs and nursing note reviewed.  Constitutional:      General: She is not in acute distress.    Appearance: Normal appearance. She is well-developed. She is obese. She is not ill-appearing, toxic-appearing or diaphoretic.     Interventions: Face mask in place.  HENT:     Head: Normocephalic and atraumatic.     Right Ear: External ear normal.     Left Ear: External ear normal.  Eyes:     General: Lids are normal. No scleral icterus.       Right eye: No discharge.        Left eye: No discharge.     Conjunctiva/sclera: Conjunctivae normal.  Neck:     Musculoskeletal: Normal range of motion and neck supple.     Trachea: Phonation normal. No tracheal deviation.  Cardiovascular:     Rate  and Rhythm: Normal rate and regular rhythm.     Pulses: Normal pulses.          Radial pulses are 2+ on the right side and 2+ on the left side.       Posterior tibial pulses are 2+ on the right side and 2+ on the left side.     Heart sounds: Normal heart sounds. No murmur. No friction rub. No gallop.   Pulmonary:     Effort: Pulmonary effort is normal. No respiratory distress.     Breath sounds: Normal breath sounds. No stridor. No wheezing, rhonchi or rales.  Chest:     Chest wall: No tenderness.  Abdominal:     General: Bowel sounds are normal. There is no distension.     Palpations: Abdomen is soft.     Tenderness: There is no abdominal tenderness. There is no guarding or rebound.  Musculoskeletal: Normal range of motion.        General: No deformity.     Right lower leg: No edema.     Left lower leg: No edema.  Lymphadenopathy:     Cervical: No cervical adenopathy.  Skin:    General: Skin is warm and dry.     Capillary Refill: Capillary refill takes less than 2 seconds.     Coloration:  Skin is not jaundiced or pale.     Findings: No rash.  Neurological:     Mental Status: She is alert and oriented to person, place, and time.     Motor: No abnormal muscle tone.     Gait: Gait normal.  Psychiatric:        Speech: Speech normal.        Behavior: Behavior normal.      Recent Results (from the past 2160 hour(s))  Hemoglobin A1c     Status: None   Collection Time: 01/16/19  8:10 AM  Result Value Ref Range   Hgb A1c MFr Bld 5.4 4.8 - 5.6 %    Comment:          Prediabetes: 5.7 - 6.4          Diabetes: >6.4          Glycemic control for adults with diabetes: <7.0    Est. average glucose Bld gHb Est-mCnc 108 mg/dL  Insulin, random     Status: Abnormal   Collection Time: 01/16/19  8:10 AM  Result Value Ref Range   INSULIN 38.8 (H) 2.6 - 24.9 uIU/mL  VITAMIN D 25 Hydroxy (Vit-D Deficiency, Fractures)     Status: Abnormal   Collection Time: 01/16/19  8:10 AM  Result Value Ref Range   Vit D, 25-Hydroxy 21.8 (L) 30.0 - 100.0 ng/mL    Comment: Vitamin D deficiency has been defined by the [place] and an Endocrine Society practice guideline as a level of serum 25-OH vitamin D less than 20 ng/mL (1,2). The Endocrine Society went on to further define vitamin D insufficiency as a level between 21 and 29 ng/mL (2). 1. IOM (Institute of Medicine). 2010. Dietary reference    intakes for calcium and D. [place]: The    [employer]. 2. Holick MF, [place], Bischoff-Ferrari HA, et al.    Evaluation, treatment, and prevention of vitamin D    deficiency: an Endocrine Society clinical practice    guideline. JCEM. 2011 Jul; 96(7):1911-30.   SARS CORONAVIRUS 2 (TAT 6-24 HRS) Nasopharyngeal Nasopharyngeal Swab     Status: None  Collection Time: 02/13/19 11:14 AM   Specimen: Nasopharyngeal Swab  Result Value Ref Range   SARS Coronavirus 2 NEGATIVE NEGATIVE    Comment: (NOTE) SARS-CoV-2 target nucleic acids are NOT DETECTED. The SARS-CoV-2 RNA is  generally detectable in upper and lower respiratory specimens during the acute phase of infection. Negative results do not preclude SARS-CoV-2 infection, do not rule out co-infections with other pathogens, and should not be used as the sole basis for treatment or other patient management decisions. Negative results must be combined with clinical observations, patient history, and epidemiological information. The expected result is Negative. Fact Sheet for Patients: SugarRoll.be Fact Sheet for Healthcare Providers: https://www.woods-mathews.com/ This test is not yet approved or cleared by the [place] FDA and  has been authorized for detection and/or diagnosis of SARS-CoV-2 by FDA under an Emergency Use Authorization (EUA). This EUA will remain  in effect (meaning this test can be used) for the duration of the COVID-19 declaration under Section 56 4(b)(1) of the Act, 21 U.S.C. section 360bbb-3(b)(1), unless the authorization is terminated or revoked sooner. Performed at [facility], [place] [address], [place], [place] [address]     PHQ2/9: Depression screen [facility] 2/9 04/08/2019 01/04/2019 09/06/2018 07/26/2018 07/23/2018  Decreased Interest 0 1 1 2 1   Down, Depressed, Hopeless 1 2 1 2 2   PHQ - 2 Score 1 3 2 4 3   Altered sleeping 0 2 0 3 0  Tired, decreased energy 0 2 1 3 3   Change in appetite 0 1 1 1  0  Feeling bad or failure about yourself  0 1 1 2  0  Trouble concentrating 0 0 2 3 3   Moving slowly or fidgety/restless 0 0 0 2 1  Suicidal thoughts 0 0 0 1 0  PHQ-9 Score 1 9 7 19 10   Difficult doing work/chores Not difficult at all Somewhat difficult - Somewhat difficult Somewhat difficult  Some recent data might be hidden    phq 9 is negative, score less than 4, on meds, she feels good  Fall Risk: Fall Risk  04/08/2019 01/04/2019 08/23/2018 07/23/2018 07/19/2018  Falls in the past year? 0 0 0 1 1  Number falls in past yr: 0 0 - 1 1   Injury with Fall? 0 0 - 0 0  Risk for fall due to : - - - History of fall(s) History of fall(s)  Risk for fall due to: Comment - - - - -  Follow up - - - Falls evaluation completed -    Functional Status Survey: Is the patient deaf or have difficulty hearing?: No Does the patient have difficulty seeing, even when wearing glasses/contacts?: No Does the patient have difficulty concentrating, remembering, or making decisions?: No Does the patient have difficulty walking or climbing stairs?: No Does the patient have difficulty dressing or bathing?: No Does the patient have difficulty doing errands alone such as visiting a doctor's office or shopping?: No    Assessment & Plan:    1. Essential hypertension Well controlled, when BP was rechecked  2. Need for influenza vaccination Done today - Flu Vaccine QUAD 6+ mos PF IM (Fluarix Quad PF)  3. Centrilobular emphysema ([place]) Well controlled with current inhalers, refills given - albuterol (VENTOLIN HFA) 108 (90 Base) MCG/ACT inhaler; INHALE 1 TO 2 PUFFS EVERY 4 HOURS AS NEEDED FOR COUGH, WHEEZING, AND SHORTNESS OF BREATH  Dispense: 18 g; Refill: 2 - umeclidinium-vilanterol (ANORO ELLIPTA) 62.5-25 MCG/INH AEPB; Inhale 1 puff into the lungs daily.  Dispense:  60 each; Refill: 2  4. Aortic atherosclerosis ([place]) On statin  5. Morbid obesity ([place]) Loosing weight, establishing good habits, seeing specialist  6. Bipolar affective disorder in remission ([place]) Mood good, on cymbalta, no SE or concerns  7. Dyslipidemia Compliant with meds, no SE, no myalgias, fatigue or jaundice Due for FLP and recheck CMP Diet and exercise recommendations for HLD reviewed and handout given - Lipid Panel - CMP w GFR - A1C  8. Prediabetes Per chart, but last A1C was low - will likely stop following if continues to be normal - CMP w GFR - A1C  9. Encounter for screening mammogram for malignant neoplasm of breast Due for mammogram - MM 3D SCREEN BREAST  BILATERAL; Future  10. Encounter for medication monitoring  - CBC w/ Diff - Lipid Panel - CMP w GFR - A1C   Return in about 6 months (around 10/06/2019) for Routine follow-up.   Delsa Grana, PA-C 04/08/19 9:13 AM

## 2019-04-09 ENCOUNTER — Ambulatory Visit (INDEPENDENT_AMBULATORY_CARE_PROVIDER_SITE_OTHER): Payer: Medicare HMO | Admitting: Psychology

## 2019-04-09 ENCOUNTER — Other Ambulatory Visit (INDEPENDENT_AMBULATORY_CARE_PROVIDER_SITE_OTHER): Payer: Self-pay | Admitting: Family Medicine

## 2019-04-09 DIAGNOSIS — F3289 Other specified depressive episodes: Secondary | ICD-10-CM

## 2019-04-09 LAB — CBC WITH DIFFERENTIAL/PLATELET
Absolute Monocytes: 608 cells/uL (ref 200–950)
Basophils Absolute: 72 cells/uL (ref 0–200)
Basophils Relative: 0.7 %
Eosinophils Absolute: 258 cells/uL (ref 15–500)
Eosinophils Relative: 2.5 %
HCT: 38.9 % (ref 35.0–45.0)
Hemoglobin: 13.4 g/dL (ref 11.7–15.5)
Lymphs Abs: 3461 cells/uL (ref 850–3900)
MCH: 32.5 pg (ref 27.0–33.0)
MCHC: 34.4 g/dL (ref 32.0–36.0)
MCV: 94.4 fL (ref 80.0–100.0)
MPV: 9.2 fL (ref 7.5–12.5)
Monocytes Relative: 5.9 %
Neutro Abs: 5902 cells/uL (ref 1500–7800)
Neutrophils Relative %: 57.3 %
Platelets: 215 10*3/uL (ref 140–400)
RBC: 4.12 10*6/uL (ref 3.80–5.10)
RDW: 11.6 % (ref 11.0–15.0)
Total Lymphocyte: 33.6 %
WBC: 10.3 10*3/uL (ref 3.8–10.8)

## 2019-04-09 LAB — COMPLETE METABOLIC PANEL WITH GFR
AG Ratio: 2.1 (calc) (ref 1.0–2.5)
ALT: 12 U/L (ref 6–29)
AST: 12 U/L (ref 10–35)
Albumin: 4.1 g/dL (ref 3.6–5.1)
Alkaline phosphatase (APISO): 65 U/L (ref 37–153)
BUN: 16 mg/dL (ref 7–25)
CO2: 26 mmol/L (ref 20–32)
Calcium: 9.5 mg/dL (ref 8.6–10.4)
Chloride: 107 mmol/L (ref 98–110)
Creat: 0.89 mg/dL (ref 0.50–1.05)
GFR, Est African American: 83 mL/min/{1.73_m2} (ref >=60)
GFR, Est Non African American: 71 mL/min/{1.73_m2} (ref >=60)
Globulin: 2 g/dL (calc) (ref 1.9–3.7)
Glucose, Bld: 89 mg/dL (ref 65–99)
Potassium: 3.7 mmol/L (ref 3.5–5.3)
Sodium: 141 mmol/L (ref 135–146)
Total Bilirubin: 0.3 mg/dL (ref 0.2–1.2)
Total Protein: 6.1 g/dL (ref 6.1–8.1)

## 2019-04-09 LAB — LIPID PANEL
Cholesterol: 144 mg/dL (ref <200)
HDL: 38 mg/dL — ABNORMAL LOW (ref >=50)
LDL Cholesterol (Calc): 79 mg/dL (calc)
Non-HDL Cholesterol (Calc): 106 mg/dL (calc) (ref <130)
Total CHOL/HDL Ratio: 3.8 (calc) (ref <5.0)
Triglycerides: 175 mg/dL — ABNORMAL HIGH (ref <150)

## 2019-04-09 LAB — HEMOGLOBIN A1C
Hgb A1c MFr Bld: 5.4 % of total Hgb (ref <5.7)
Mean Plasma Glucose: 108 (calc)
eAG (mmol/L): 6 (calc)

## 2019-04-10 ENCOUNTER — Ambulatory Visit (INDEPENDENT_AMBULATORY_CARE_PROVIDER_SITE_OTHER): Payer: Medicare HMO | Admitting: Family Medicine

## 2019-04-12 ENCOUNTER — Telehealth: Payer: Self-pay | Admitting: Family Medicine

## 2019-04-12 NOTE — Telephone Encounter (Signed)
Medication Refill - Medication:  albuterol (VENTOLIN HFA) 108 (90 Base) MCG/ACT inhaler [id number] A cream for pt's inner thigh that she stated she talk to Dr on 11/30 about this.   Has the patient contacted their pharmacy? No. (Agent: If no, request that the patient contact the pharmacy for the refill.) (Agent: If yes, when and what did the pharmacy advise?)  Preferred Pharmacy (with phone number or street name):  [id number] - [place], [place] [address] [phone] (Phone     Agent: Please be advised that RX refills may take up to 3 business days. We ask that you follow-up with your pharmacy.

## 2019-04-12 NOTE — Telephone Encounter (Signed)
Tried to call pt this has been sent to the pharmacy

## 2019-04-15 ENCOUNTER — Telehealth: Payer: Self-pay | Admitting: *Deleted

## 2019-04-15 DIAGNOSIS — Z87891 Personal history of nicotine dependence: Secondary | ICD-10-CM

## 2019-04-15 DIAGNOSIS — Z122 Encounter for screening for malignant neoplasm of respiratory organs: Secondary | ICD-10-CM

## 2019-04-15 NOTE — Telephone Encounter (Signed)
Patient has been notified that annual lung cancer screening low dose CT scan is due currently or will be in near future. Confirmed that patient is within the age range of 55-77, and asymptomatic, (no signs or symptoms of lung cancer). Patient denies illness that would prevent curative treatment for lung cancer if found. Verified smoking history, (current, 42.5 pack year). The shared decision making visit was done 12/21/16. Patient is agreeable for CT scan being scheduled.

## 2019-04-16 ENCOUNTER — Other Ambulatory Visit: Payer: Self-pay | Admitting: Pulmonary Disease

## 2019-04-18 ENCOUNTER — Other Ambulatory Visit: Payer: Self-pay

## 2019-04-18 ENCOUNTER — Encounter (INDEPENDENT_AMBULATORY_CARE_PROVIDER_SITE_OTHER): Payer: Self-pay | Admitting: Family Medicine

## 2019-04-18 ENCOUNTER — Ambulatory Visit (INDEPENDENT_AMBULATORY_CARE_PROVIDER_SITE_OTHER): Payer: Medicare HMO | Admitting: Family Medicine

## 2019-04-18 VITALS — BP 131/75 | HR 76 | Temp 97.8°F | Ht 63.0 in | Wt 220.0 lb

## 2019-04-18 DIAGNOSIS — E8881 Metabolic syndrome: Secondary | ICD-10-CM | POA: Diagnosis not present

## 2019-04-18 DIAGNOSIS — Z6839 Body mass index (BMI) 39.0-39.9, adult: Secondary | ICD-10-CM | POA: Diagnosis not present

## 2019-04-18 DIAGNOSIS — F3289 Other specified depressive episodes: Secondary | ICD-10-CM

## 2019-04-18 MED ORDER — VICTOZA 18 MG/3ML ~~LOC~~ SOPN: 1.8000 mg | PEN_INJECTOR | Freq: Every day | SUBCUTANEOUS | 0 refills | Status: DC

## 2019-04-18 MED ORDER — TOPIRAMATE 25 MG PO TABS: 25.0000 mg | ORAL_TABLET | Freq: Every day | ORAL | 0 refills | Status: DC

## 2019-04-22 ENCOUNTER — Ambulatory Visit
Admission: RE | Admit: 2019-04-22 | Discharge: 2019-04-22 | Disposition: A | Discharge status: Home / Self Care | Payer: Medicare HMO | Source: Ambulatory Visit | Attending: Oncology | Admitting: Oncology

## 2019-04-22 ENCOUNTER — Other Ambulatory Visit: Payer: Self-pay

## 2019-04-22 DIAGNOSIS — Z87891 Personal history of nicotine dependence: Secondary | ICD-10-CM | POA: Diagnosis not present

## 2019-04-22 DIAGNOSIS — Z122 Encounter for screening for malignant neoplasm of respiratory organs: Secondary | ICD-10-CM

## 2019-04-23 ENCOUNTER — Ambulatory Visit (INDEPENDENT_AMBULATORY_CARE_PROVIDER_SITE_OTHER): Payer: Medicare HMO | Admitting: Psychology

## 2019-04-23 DIAGNOSIS — F3289 Other specified depressive episodes: Secondary | ICD-10-CM

## 2019-04-23 NOTE — Progress Notes (Signed)
Office: [phone]  /  Fax: [phone]   HPI:  Chief Complaint: [place] is here to discuss her progress with her obesity treatment plan. She is on the Category 2 plan and states she is following her eating plan approximately 50 % of the time. She states she is exercising 0 minutes 0 times per week.  Vassiliki is still having dental pain and she is unable to eat as much protein as she should. She is only eating a banana for breakfast.  She is doing a good job with meal prepping on Sundays.  Insulin Resistance Lamara has a diagnosis of insulin resistance. She does feel [name] does help with appetite. She denies constipation or nausea. Shareka denies polyphagia.  Other Depression with emotional eating behaviors Idalis feels stress eating is well controlled with Topamax.   Today's visit was # 15  Starting weight: 226 lbs Starting date: 07/26/2018 Today's weight : 220 lbs  Today's date: 04/18/2019 Total lbs lost to date: 6 Total lbs lost since last in-office visit: 1  ASSESSMENT AND PLAN:  Insulin resistance - Plan: liraglutide ([name]) 18 MG/3ML SOPN  Other depression - With emotional eating  - Plan: topiramate (TOPAMAX) 25 MG tablet  Class 2 severe obesity with serious comorbidity and body mass index (BMI) of 39.0 to 39.9 in adult, unspecified obesity type ([place])  PLAN:  Insulin Resistance Holiday will continue to work on weight loss, exercise, and decreasing simple carbohydrates to help decrease the risk of diabetes. Carianne agreed to continue [name] 1.8 mg sub Q daily #3 pens with no refills and follow up with [name] as directed to closely monitor her progress.  Emotional Eating Behaviors (other depression) Behavior modification techniques were discussed today to help Chiane deal with her emotional/non-hunger eating behaviors. Cledith agreed to continue Topamax 25 mg daily #30 with no refills and follow up as directed. Will continue to follow and monitor her  progress.  Obesity Patrese is currently in the action stage of change. As such, her goal is to continue with weight loss efforts She has agreed to follow the Category 2 plan We discussed the following Behavioral Modification Strategies today: planning for success, no skipping meals and increasing lean protein intake Handout for egg muffin recipe was given to patient today.  Terrylynn has agreed to follow up with our clinic in 3 weeks. She was informed of the importance of frequent follow up visits to maximize her success with intensive lifestyle modifications for her multiple health conditions.  ALLERGIES: Allergies  Allergen Reactions  . Hydrochlorothiazide Other (See Comments)    Electrolyte imbalance  . Lasix [Furosemide]     Electrolyte imbalance    MEDICATIONS: Current Outpatient Medications on File Prior to Visit  Medication Sig Dispense Refill  . albuterol (VENTOLIN HFA) 108 (90 Base) MCG/ACT inhaler INHALE 1 TO 2 PUFFS EVERY 4 HOURS AS NEEDED FOR COUGH, WHEEZING, AND SHORTNESS OF BREATH 18 g 2  . amLODipine (NORVASC) 5 MG tablet TAKE 1 TABLET EVERY DAY 90 tablet 1  . atenolol (TENORMIN) 25 MG tablet TAKE 1 TABLET EVERY DAY 90 tablet 1  . atorvastatin (LIPITOR) 10 MG tablet TAKE 1 TABLET AT BEDTIME 90 tablet 1  . celecoxib (CELEBREX) 200 MG capsule Take 200 mg by mouth daily.    . clotrimazole (LOTRIMIN) 1 % cream Apply 1 application topically 2 (two) times daily. (Patient taking differently: Apply 1 application topically as needed. ) 30 g 0  . cyclobenzaprine (FLEXERIL) 5 MG tablet Take 5 mg by mouth  at bedtime.     . DULoxetine (CYMBALTA) 60 MG capsule Take 1 capsule (60 mg total) by mouth 2 (two) times daily. 180 capsule 1  . gabapentin (NEURONTIN) 300 MG capsule Take 600 mg by mouth 2 (two) times daily.     Marland Kitchen HYDROcodone-acetaminophen (NORCO/VICODIN) 5-325 MG tablet One-half to one pill by mouth daily PRN ([facility])    . Insulin Pen Needle 32G X 4 MM MISC Use 1 needle daily to  inject [name]. 100 each 0  . loratadine (CLARITIN) 10 MG tablet Take 1 tablet (10 mg total) by mouth daily as needed for allergies. 90 tablet 3  . losartan (COZAAR) 100 MG tablet TAKE 1 TABLET EVERY DAY 90 tablet 1  . umeclidinium-vilanterol (ANORO ELLIPTA) 62.5-25 MCG/INH AEPB Inhale 1 puff into the lungs daily. 60 each 2  . vitamin B-12 (CYANOCOBALAMIN) 1000 MCG tablet Take 1,000 mcg by mouth daily.    . Vitamin D, Ergocalciferol, (DRISDOL) 1.25 MG (50000 UT) CAPS capsule Take 1 capsule (50,000 Units total) by mouth every 3 (three) days. 10 capsule 0  . omeprazole (PRILOSEC) 40 MG capsule Take 1 capsule (40 mg total) by mouth daily. 30 capsule 2   No current facility-administered medications on file prior to visit.    PAST MEDICAL HISTORY: Past Medical History:  Diagnosis Date  . ADD (attention deficit disorder)   . Alcohol abuse   . Anemia   . Anxiety   . Aortic atherosclerosis ([place]) 02/20/2018   Chest CT Sept 2019  . Arthritis    rheumatoid arthritis  . Asthma   . Bipolar disorder ([place])   . Centrilobular emphysema ([place]) 02/20/2018   Chest CT Sept 2019  . Constipation   . Degenerative disc disease at L5-S1 level 09/28/2016   See ortho note May 2018  . Depression    bipolar, hx of suicide attempt  . Drug use   . Dyspnea    with exertion  . GERD (gastroesophageal reflux disease)   . H/O suicide attempt    slit wrists  . Hepatitis C 06/26/2014  . Hip pain   . History of alcohol abuse   . History of diverticulitis 2013  . History of hepatitis C    HEP "C"--three years ago  . History of MRSA infection 2013  . HLD (hyperlipidemia)   . Hypertension   . Hypothyroidism   . Incisional hernia 11/08/2012  . Knee pain   . 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 Multinodular thyroid   . OSA (obstructive sleep apnea)    managed by Dr. Manuella Ghazi  . Osteoporosis   . Post-traumatic osteoarthritis of right knee 09/24/2015  . Prediabetes   . Recurrent ventral hernia 11/08/2012  . Sleep apnea     Dx 3  years ago. Use C-PAP  . 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 Thyroid disease    Goiter  . Vitamin B12 deficiency   . Vitamin D deficiency disease     PAST SURGICAL HISTORY: Past Surgical History:  Procedure Laterality Date  . BILATERAL SALPINGOOPHORECTOMY     due to abnormal mass  . BREAST BIOPSY Left    neg  . BREAST SURGERY Left 20 yrs ago  . CESAREAN SECTION    . COLONOSCOPY    . COLONOSCOPY WITH PROPOFOL N/A 10/16/2017   Procedure: COLONOSCOPY WITH PROPOFOL;  Surgeon: Jonathon Bellows, MD;  Location: [facility] ENDOSCOPY;  Service: Gastroenterology;  Laterality: N/A;  . HERNIA REPAIR  06/2011, July 2014   Ventral  wall repair with Physiomesh  . TONSILLECTOMY    . TOTAL KNEE ARTHROPLASTY Right 05/10/2016   Procedure: TOTAL KNEE ARTHROPLASTY;  Surgeon: Corky Mull, MD;  Location: [facility] ORS;  Service: Orthopedics;  Laterality: Right;  . TUBAL LIGATION      SOCIAL HISTORY: Social History   Tobacco Use  . Smoking status: Current Every Day Smoker    Packs/day: 0.50    Years: 41.00    Pack years: 20.50    Types: Cigarettes  . Smokeless tobacco: Never Used  Substance Use Topics  . Alcohol use: No    Alcohol/week: 0.0 standard drinks    Comment: sober since October 2018  . 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 Alcohol abuse Father   . Heart attack Father   . Arthritis Mother   . Asthma Mother   . Mental illness Mother   . Thyroid disease Mother   . COPD Mother   . Heart disease Mother   . Congestive Heart Failure Mother   . Alcohol abuse Mother   . Eating disorder Mother   . Arthritis Brother   . Mental illness Brother   . Cancer Brother        non-hodkins lymphoma  . Alcohol abuse Sister   . Drug abuse Sister   . Mental illness Sister   . Mental illness Sister   . Fibromyalgia Sister   . Obesity Sister   . Pneumonia Sister   . Mental  illness Sister   . Alcohol abuse Sister   . Drug abuse Sister   . Diabetes Neg Hx   . Stroke Neg Hx   . Breast cancer Neg Hx     ROS: Review of Systems  Constitutional: Positive for weight loss.  Gastrointestinal: Negative for constipation and nausea.  Endo/Heme/Allergies:       Negative for polyphagia  Psychiatric/Behavioral: Positive for depression.    PHYSICAL EXAM: Blood pressure 131/75, pulse 76, temperature 97.8 F (36.6 C), temperature source Oral, height 5\' 3"  (1.6 m), weight 220 lb (99.8 kg), SpO2 98 %. Body mass index is 38.97 kg/m. Physical Exam Vitals reviewed.  Constitutional:      General: She is not in acute distress.    Appearance: Normal appearance. She is well-developed. She is obese.  Cardiovascular:     Rate and Rhythm: Normal rate.  Pulmonary:     Effort: Pulmonary effort is normal.  Musculoskeletal:        General: Normal range of motion.  Skin:    General: Skin is warm and dry.  Neurological:     Mental Status: She is alert and oriented to person, place, and time.  Psychiatric:        Mood and Affect: Mood normal.        Behavior: Behavior normal.     RECENT LABS AND TESTS: BMET    Component Value Date/Time   NA 141 04/08/2019 0000   NA 139 07/26/2018 1101   NA 136 04/29/2014 1133   K 3.7 04/08/2019 0000   K 4.2 04/29/2014 1133   CL 107 04/08/2019 0000   CL 103 04/29/2014 1133   CO2 26 04/08/2019 0000   CO2 30 04/29/2014 1133   GLUCOSE 89 04/08/2019 0000   GLUCOSE 85 04/29/2014 1133   BUN 16 04/08/2019 0000   BUN 13 07/26/2018 1101   BUN 9 04/29/2014 1133  CREATININE 0.89 04/08/2019 0000   CALCIUM 9.5 04/08/2019 0000   CALCIUM 9.1 04/29/2014 1133   GFRNONAA 71 04/08/2019 0000   GFRAA 83 04/08/2019 0000   Lab Results  Component Value Date   HGBA1C 5.4 04/08/2019   HGBA1C 5.4 01/16/2019   HGBA1C 5.9 (H) 07/26/2018   HGBA1C 5.2 08/16/2017   HGBA1C 5.6 10/14/2016   Lab Results  Component Value Date   INSULIN 38.8 (H)  01/16/2019   INSULIN 31.0 (H) 07/26/2018   CBC    Component Value Date/Time   WBC 10.3 04/08/2019 0000   RBC 4.12 04/08/2019 0000   HGB 13.4 04/08/2019 0000   HGB 15.2 07/26/2018 1101   HCT 38.9 04/08/2019 0000   HCT 43.4 07/26/2018 1101   PLT 215 04/08/2019 0000   PLT 241 10/09/2015 0933   MCV 94.4 04/08/2019 0000   MCV 91 07/26/2018 1101   MCV 91 04/29/2014 1133   MCH 32.5 04/08/2019 0000   MCHC 34.4 04/08/2019 0000   RDW 11.6 04/08/2019 0000   RDW 11.7 07/26/2018 1101   RDW 13.1 04/29/2014 1133   LYMPHSABS 3,461 04/08/2019 0000   LYMPHSABS 5.1 (H) 07/26/2018 1101   LYMPHSABS 2.3 11/20/2012 1005   MONOABS 390 10/14/2016 0903   MONOABS 0.5 11/20/2012 1005   EOSABS 258 04/08/2019 0000   EOSABS 0.4 07/26/2018 1101   EOSABS 0.2 11/20/2012 1005   BASOSABS 72 04/08/2019 0000   BASOSABS 0.1 07/26/2018 1101   BASOSABS 0.1 11/20/2012 1005   Iron/TIBC/Ferritin/ %Sat No results found for: IRON, TIBC, FERRITIN, IRONPCTSAT Lipid Panel     Component Value Date/Time   CHOL 144 04/08/2019 0000   CHOL 158 07/26/2018 1101   TRIG 175 (H) 04/08/2019 0000   HDL 38 (L) 04/08/2019 0000   HDL 44 07/26/2018 1101   CHOLHDL 3.8 04/08/2019 0000   VLDL 27 10/14/2016 0903   LDLCALC 79 04/08/2019 0000   Hepatic Function Panel     Component Value Date/Time   PROT 6.1 04/08/2019 0000   PROT 7.0 07/26/2018 1101   PROT 7.1 03/13/2013 1509   ALBUMIN 4.0 12/17/2018 1457   ALBUMIN 4.7 07/26/2018 1101   ALBUMIN 3.5 03/13/2013 1509   AST 12 04/08/2019 0000   AST 90 (H) 03/13/2013 1509   ALT 12 04/08/2019 0000   ALT 96 (H) 03/13/2013 1509   ALKPHOS 85 12/17/2018 1457   ALKPHOS 110 03/13/2013 1509   BILITOT 0.3 04/08/2019 0000   BILITOT 0.4 07/26/2018 1101   BILITOT 0.6 03/13/2013 1509   BILIDIR <0.1 12/17/2018 1457   IBILI NOT CALCULATED 12/17/2018 1457      Component Value Date/Time   TSH 1.430 07/26/2018 1101   TSH 0.85 08/16/2017 0840   TSH 0.94 10/14/2016 0903     OBESITY  BEHAVIORAL INTERVENTION VISIT DOCUMENTATION FOR INSURANCE (~15 minutes)    ASK: We discussed the diagnosis of obesity with [place] today and Lannie agreed to give [name] permission to discuss obesity behavioral modification therapy today.  ASSESS: Valeree has the diagnosis of obesity and her BMI today is 105.98 Megnan is in the action stage of change   ADVISE: Antanasia was educated on the multiple health risks of obesity as well as the benefit of weight loss to improve her health. She was advised of the need for long term treatment and the importance of lifestyle modifications to improve her current health and to decrease her risk of future health problems.  AGREE: Multiple dietary modification options and treatment options were discussed  and  Vanezza agreed to follow the recommendations documented in the above note.  ARRANGE: Deonta was educated on the importance of frequent visits to treat obesity as outlined per CMS and USPSTF guidelines and agreed to schedule her next follow up appointment today.   I, Doreene Nest, am acting as transcriptionist for [employer], FNP-C  I have reviewed the above documentation for accuracy and completeness, and I agree with the above.  - Novelle Addair, FNP-C.

## 2019-04-24 ENCOUNTER — Encounter (INDEPENDENT_AMBULATORY_CARE_PROVIDER_SITE_OTHER): Payer: Self-pay | Admitting: Family Medicine

## 2019-04-24 ENCOUNTER — Encounter: Payer: Self-pay | Admitting: *Deleted

## 2019-05-08 ENCOUNTER — Encounter: Payer: Self-pay | Admitting: Pulmonary Disease

## 2019-05-08 ENCOUNTER — Ambulatory Visit (INDEPENDENT_AMBULATORY_CARE_PROVIDER_SITE_OTHER): Payer: Medicare HMO | Admitting: Pulmonary Disease

## 2019-05-08 DIAGNOSIS — Z9989 Dependence on other enabling machines and devices: Secondary | ICD-10-CM

## 2019-05-08 DIAGNOSIS — R0602 Shortness of breath: Secondary | ICD-10-CM | POA: Diagnosis not present

## 2019-05-08 DIAGNOSIS — G4733 Obstructive sleep apnea (adult) (pediatric): Secondary | ICD-10-CM

## 2019-05-08 DIAGNOSIS — E871 Hypo-osmolality and hyponatremia: Secondary | ICD-10-CM | POA: Diagnosis not present

## 2019-05-08 DIAGNOSIS — J432 Centrilobular emphysema: Secondary | ICD-10-CM

## 2019-05-08 DIAGNOSIS — F17210 Nicotine dependence, cigarettes, uncomplicated: Secondary | ICD-10-CM

## 2019-05-08 NOTE — Progress Notes (Signed)
Subjective:    Patient ID: Susan [name], female    DOB: 10-May-1960, 58 y.o.   MRN: [id number] Virtual Visit Via Video or Telephone Note:   This visit type was conducted due to national recommendations for restrictions regarding the COVID-19 pandemic .  This format is felt to be most appropriate for this patient at this time.  All issues noted in this document were discussed and addressed.  No physical exam was performed (except for noted visual exam findings with Video Visits).    I connected with [facility] by  telephone at 8:36 AM and verified that I was speaking with the correct person using two identifiers. Location patient: home Location provider: [facility] Persons participating in the virtual visit: patient, physician   I discussed the limitations, risks, security and privacy concerns of performing an evaluation and management service by phone and the availability of in person appointments. The patient expressed understanding and agreed to proceed.  HPI The patient is a 58 year old current smoker (6 to 8 cigarettes/day, past heavy smoker) who is following today via telephone due to COVID-19 pandemic.  She was initially seen on 08 January 2019.  Several issues were addressed at that time included shortness of breath, possible COPD, significant gastroesophageal reflux, hyponatremia, morbid obesity and tobacco dependence due to cigarettes.  The patient has finally started on Anoro recall that she had received Anoro prescription from her primary care practitioner however had not received the medication at the time of her initial visit here.  We gave her samples until her shipment with come in.  She states that since starting Anoro her dyspnea is markedly improved.  She had PFTs performed on 8 October.  PFTs showed significant decrease in ERV due to obesity and increased airway resistance.  She had low-dose CT scan of the chest for lung cancer screening which was  negative this was performed on 22 April 2019, she has evidence of centrilobular emphysema noted on CT.  A follow-up chemistry determination showed that her sodium has corrected off Wellbutrin.  This was likely the culprit.  She is following at the health and wellness and has lost 11 pounds of weight by her report.  She has started to feel markedly better and her dyspnea is controlled with the Anoro as noted above.  She has issues with sleep apnea that have not been followed very carefully.  She would like to have a dedicated sleep apnea follow-up appointment and I will set her up for that with Dr. Chesley Mires.  She does not endorse any fevers, chills or sweats.  No cough or sputum production.  No hemoptysis.  No sputum production.  She feels well.  Review of Systems A 10 point review of systems was performed and it is as noted above otherwise negative.     Objective:   Physical Exam  A physical exam was not performed as this was a via telephone visit.  The patient's speech was fluid.       Assessment & Plan:    Dyspnea She notes marked improvement with Anoro Multifactorial etiology COPD/obesity/diastolic dysfunction Continue Anoro Continue efforts at weight loss Follow-up 3 months time call sooner if any new respiratory difficulties arise  COPD/emphysema Preserved FEV1 Evidence of increased airway resistance Continue Anoro Smoking cessation  Obstructive sleep apnea on CPAP Schedule with Dr. Halford Chessman for reassessment Has not had follow-up on this issue  Hyponatremia Resolved Likely due to Wellbutrin  Tobacco dependence due to cigarettes Patient counseled with regards  to discontinuation of smoking Total counseling time 3 to 5 minutes     C. Derrill Kay, MD [facility]    This note was dictated using voice recognition software/Dragon.  Despite best efforts to proofread, errors can occur which can change the meaning.  Any change was purely unintentional.

## 2019-05-08 NOTE — Patient Instructions (Signed)
1.  Continue using Anoro.  2.  You are doing great with your weight loss continue your health and wellness program.  3.  You are also doing a great job with eating smoking.  4.  I will set you an appointment with Dr. Halford Chessman to discuss your sleep apnea issues.  5.  I will see you in follow-up in 3 months time call sooner should any new difficulties arise.

## 2019-05-14 ENCOUNTER — Ambulatory Visit (INDEPENDENT_AMBULATORY_CARE_PROVIDER_SITE_OTHER): Payer: Medicare HMO | Admitting: Psychology

## 2019-05-14 DIAGNOSIS — F3289 Other specified depressive episodes: Secondary | ICD-10-CM | POA: Diagnosis not present

## 2019-05-15 ENCOUNTER — Telehealth (INDEPENDENT_AMBULATORY_CARE_PROVIDER_SITE_OTHER): Payer: Medicare HMO | Admitting: Family Medicine

## 2019-05-15 ENCOUNTER — Other Ambulatory Visit: Payer: Self-pay

## 2019-05-15 DIAGNOSIS — F3289 Other specified depressive episodes: Secondary | ICD-10-CM

## 2019-05-15 DIAGNOSIS — E8881 Metabolic syndrome: Secondary | ICD-10-CM | POA: Diagnosis not present

## 2019-05-15 DIAGNOSIS — Z6838 Body mass index (BMI) 38.0-38.9, adult: Secondary | ICD-10-CM

## 2019-05-15 DIAGNOSIS — E88819 Insulin resistance, unspecified: Secondary | ICD-10-CM

## 2019-05-15 DIAGNOSIS — E559 Vitamin D deficiency, unspecified: Secondary | ICD-10-CM

## 2019-05-15 MED ORDER — VICTOZA 18 MG/3ML ~~LOC~~ SOPN: 1.8000 mg | PEN_INJECTOR | Freq: Every day | SUBCUTANEOUS | 0 refills | Status: DC

## 2019-05-15 MED ORDER — INSULIN PEN NEEDLE 32G X 4 MM MISC: 0 refills | Status: DC

## 2019-05-15 MED ORDER — VITAMIN D (ERGOCALCIFEROL) 1.25 MG (50000 UNIT) PO CAPS: 50000.0000 [IU] | ORAL_CAPSULE | ORAL | 0 refills | Status: DC

## 2019-05-15 MED ORDER — TOPIRAMATE 25 MG PO TABS: 25.0000 mg | ORAL_TABLET | Freq: Every day | ORAL | 0 refills | Status: DC

## 2019-05-20 NOTE — Progress Notes (Signed)
TeleHealth Visit:  Due to the COVID-19 pandemic, this visit was completed with telemedicine (audio/video) technology to reduce patient and provider exposure as well as to preserve personal protective equipment.   Susan [name] has verbally consented to this TeleHealth visit. The patient is located at home, the provider is located at the [employer] [facility] office. The participants in this visit include the listed provider and patient and any and all parties involved. The visit was conducted today via telephone.  Susan [name] was unable to use realtime audiovisual technology today and the telehealth visit was conducted via telephone.  Chief Complaint: Susan [name] is here to discuss her progress with her obesity treatment plan along with follow-up of her obesity related diagnoses. Susan [name] is on the Category 2 Plan and states she is following her eating plan approximately 50% of the time. Susan [name] states she is exercising 0 minutes 0 times per week.  Today's visit was #: 16 Starting weight: 226 lbs Starting date: 07/26/2018  Interim History: Susan [name] is struggling with severe left hip pain. She has been told she needs total hip replacement. She reports being off the plan over the holidays.  and says she "over did it with sweets". She says she has gained weight. Susan [name] is now back on the plan.  Subjective:   Vitamin D deficiency  Susan [name]'s last vitamin D level was 21.8 (01/16/19), and was not at goal. She is on prescription vitamin D.  Insulin resistance  Her last A1c was at 5.4 (04/08/19). She has no polyphagia. She reports [name] helps control hunger. She has no constipation or nausea.  Other depression - With emotional eating  Susan [name] feels emotional eating is well controlled. She has been eating more, due to being on a burst of steroids. She feels Topamax is helping with cravings and she denies any side effects.  Assessment/Plan:   Vitamin D deficiency  Low Vitamin D level  contributes to fatigue and are associated with obesity, breast, and colon cancer. Susan [name] agrees to continue to take prescription Vitamin D @50 ,000 IU every week #4 with no refills and she will follow-up for routine testing of vitamin D, at least 2-3 times per year to avoid over-replacement. We will check vitamin D level at the next visit and Susan [name] agrees to follow up as directed.  Insulin Resistance  Susan [name] will continue to work on weight loss, exercise, and decreasing simple carbohydrates to help decrease the risk of diabetes. She agrees to continue [name] 1.8 mg sub Q daily #3 pens with no refills and prescription was also written today for pen needles #100 each with no refills. We will check A1c and fasting insulin at the next visit. Susan [name] agreed to follow-up with [name] as directed to closely monitor her progress.  Other depression - With emotional eating   Behavior modification techniques were discussed today to help Susan [name] deal with her emotional/non-hunger eating behaviors.  Susan [name] agrees to continue Topamax 25 mg daily #30 with no refills. Orders and follow up as documented in patient record.   Obesity Class 2 severe obesity with serious comorbidity and body mass index (BMI) of 38.0 to 38.9 in adult, unspecified obesity type [facility])  Susan [name] is currently in the action stage of change. As such, her goal is to continue with weight loss efforts. She has agreed to on the Category 2 Plan.   Susan [name] will exercise as tolerated.  We discussed the following behavioral modification strategies today: increasing lean protein intake, decreasing simple carbohydrates, meal  planning and cooking strategies and planning for success.  Susan [name] has agreed to follow-up with our clinic in 3 weeks. She was informed of the importance of frequent follow-up visits to maximize her success with intensive lifestyle modifications for her multiple health conditions.  Objective:   VITALS: Per patient if applicable,  see vitals. GENERAL: Alert and in no acute distress. CARDIOPULMONARY: No increased WOB. Speaking in clear sentences.  PSYCH: Pleasant and cooperative. Speech normal rate and rhythm. Affect is appropriate. Insight and judgement are appropriate. Attention is focused, linear, and appropriate.  NEURO: Oriented as arrived to appointment on time with no prompting.   Lab Results  Component Value Date   CREATININE 0.89 04/08/2019   BUN 16 04/08/2019   NA 141 04/08/2019   K 3.7 04/08/2019   CL 107 04/08/2019   CO2 26 04/08/2019   Lab Results  Component Value Date   ALT 12 04/08/2019   AST 12 04/08/2019   ALKPHOS 85 12/17/2018   BILITOT 0.3 04/08/2019   Lab Results  Component Value Date   HGBA1C 5.4 04/08/2019   HGBA1C 5.4 01/16/2019   HGBA1C 5.9 (H) 07/26/2018   HGBA1C 5.2 08/16/2017   HGBA1C 5.6 10/14/2016   Lab Results  Component Value Date   INSULIN 38.8 (H) 01/16/2019   INSULIN 31.0 (H) 07/26/2018   Lab Results  Component Value Date   TSH 1.430 07/26/2018   Lab Results  Component Value Date   CHOL 144 04/08/2019   HDL 38 (L) 04/08/2019   LDLCALC 79 04/08/2019   TRIG 175 (H) 04/08/2019   CHOLHDL 3.8 04/08/2019   Lab Results  Component Value Date   WBC 10.3 04/08/2019   HGB 13.4 04/08/2019   HCT 38.9 04/08/2019   MCV 94.4 04/08/2019   PLT 215 04/08/2019   No results found for: IRON, TIBC, FERRITIN  Attestation Statements:   Reviewed by clinician on day of visit: allergies, medications, problem list, medical history, surgical history, family history, social history, and previous encounter notes.  Time spent on visit was 19 minutes (10:06-10:25 AM).   Corey Skains, am acting as Location manager for [employer], FNP-C.  I have reviewed the above documentation for accuracy and completeness, and I agree with the above. - Georgianne Fick, FNP

## 2019-05-28 ENCOUNTER — Ambulatory Visit (INDEPENDENT_AMBULATORY_CARE_PROVIDER_SITE_OTHER): Payer: Medicare HMO | Admitting: Psychology

## 2019-05-28 DIAGNOSIS — F3289 Other specified depressive episodes: Secondary | ICD-10-CM

## 2019-06-05 ENCOUNTER — Ambulatory Visit (INDEPENDENT_AMBULATORY_CARE_PROVIDER_SITE_OTHER): Payer: Self-pay | Admitting: Family Medicine

## 2019-06-11 ENCOUNTER — Ambulatory Visit (INDEPENDENT_AMBULATORY_CARE_PROVIDER_SITE_OTHER): Payer: Medicare HMO | Admitting: Psychology

## 2019-06-11 DIAGNOSIS — F3289 Other specified depressive episodes: Secondary | ICD-10-CM

## 2019-06-12 ENCOUNTER — Other Ambulatory Visit (INDEPENDENT_AMBULATORY_CARE_PROVIDER_SITE_OTHER): Payer: Self-pay | Admitting: Family Medicine

## 2019-06-12 DIAGNOSIS — E88819 Insulin resistance, unspecified: Secondary | ICD-10-CM

## 2019-06-12 DIAGNOSIS — F3289 Other specified depressive episodes: Secondary | ICD-10-CM

## 2019-06-12 DIAGNOSIS — E8881 Metabolic syndrome: Secondary | ICD-10-CM

## 2019-06-12 DIAGNOSIS — E559 Vitamin D deficiency, unspecified: Secondary | ICD-10-CM

## 2019-06-14 DIAGNOSIS — M1612 Unilateral primary osteoarthritis, left hip: Secondary | ICD-10-CM | POA: Insufficient documentation

## 2019-06-17 ENCOUNTER — Other Ambulatory Visit (INDEPENDENT_AMBULATORY_CARE_PROVIDER_SITE_OTHER): Payer: Self-pay | Admitting: Family Medicine

## 2019-06-17 DIAGNOSIS — E8881 Metabolic syndrome: Secondary | ICD-10-CM

## 2019-06-17 DIAGNOSIS — E559 Vitamin D deficiency, unspecified: Secondary | ICD-10-CM

## 2019-06-17 DIAGNOSIS — F3289 Other specified depressive episodes: Secondary | ICD-10-CM

## 2019-06-20 ENCOUNTER — Other Ambulatory Visit: Payer: Self-pay | Admitting: Surgery

## 2019-06-21 ENCOUNTER — Other Ambulatory Visit (INDEPENDENT_AMBULATORY_CARE_PROVIDER_SITE_OTHER): Payer: Self-pay | Admitting: Family Medicine

## 2019-06-21 DIAGNOSIS — E8881 Metabolic syndrome: Secondary | ICD-10-CM

## 2019-06-21 DIAGNOSIS — F3289 Other specified depressive episodes: Secondary | ICD-10-CM

## 2019-06-21 DIAGNOSIS — E559 Vitamin D deficiency, unspecified: Secondary | ICD-10-CM

## 2019-06-24 ENCOUNTER — Ambulatory Visit: Payer: Medicare HMO | Admitting: Psychology

## 2019-06-24 ENCOUNTER — Other Ambulatory Visit: Payer: Self-pay | Admitting: Family Medicine

## 2019-06-24 ENCOUNTER — Ambulatory Visit (INDEPENDENT_AMBULATORY_CARE_PROVIDER_SITE_OTHER): Payer: Medicare HMO | Admitting: Psychology

## 2019-06-24 DIAGNOSIS — F3289 Other specified depressive episodes: Secondary | ICD-10-CM | POA: Diagnosis not present

## 2019-07-03 ENCOUNTER — Other Ambulatory Visit (INDEPENDENT_AMBULATORY_CARE_PROVIDER_SITE_OTHER): Payer: Self-pay | Admitting: Family Medicine

## 2019-07-03 DIAGNOSIS — F3289 Other specified depressive episodes: Secondary | ICD-10-CM

## 2019-07-03 DIAGNOSIS — E8881 Metabolic syndrome: Secondary | ICD-10-CM

## 2019-07-03 DIAGNOSIS — E559 Vitamin D deficiency, unspecified: Secondary | ICD-10-CM

## 2019-07-09 ENCOUNTER — Ambulatory Visit: Payer: Self-pay | Admitting: Family Medicine

## 2019-07-09 NOTE — Telephone Encounter (Signed)
Unable to reach pt after 3 attempts. Forwarding to [employer] from agent: Message from Rayann Heman sent at 07/09/2019 9:33 AM EST  Summary: Both ankel swelling    Pt called and stated that both ankles are swelling and would like some advice

## 2019-07-15 ENCOUNTER — Ambulatory Visit (INDEPENDENT_AMBULATORY_CARE_PROVIDER_SITE_OTHER): Payer: Medicare HMO | Admitting: Psychology

## 2019-07-15 DIAGNOSIS — F3289 Other specified depressive episodes: Secondary | ICD-10-CM

## 2019-07-16 ENCOUNTER — Encounter
Admission: RE | Admit: 2019-07-16 | Discharge: 2019-07-16 | Disposition: A | Discharge status: Home / Self Care | Payer: Medicare HMO | Source: Ambulatory Visit | Attending: Surgery | Admitting: Surgery

## 2019-07-16 ENCOUNTER — Encounter (INDEPENDENT_AMBULATORY_CARE_PROVIDER_SITE_OTHER): Payer: Self-pay | Admitting: Family Medicine

## 2019-07-16 ENCOUNTER — Other Ambulatory Visit: Payer: Self-pay

## 2019-07-16 DIAGNOSIS — Z01818 Encounter for other preprocedural examination: Secondary | ICD-10-CM | POA: Insufficient documentation

## 2019-07-16 DIAGNOSIS — I10 Essential (primary) hypertension: Secondary | ICD-10-CM | POA: Diagnosis not present

## 2019-07-16 NOTE — Patient Instructions (Signed)
Your procedure is scheduled on: Tues 3/16 Report to [place] To find out your arrival time please call [phone] between 1PM - 3PM on Mon 3/15  Remember: Instructions that are not followed completely may result in serious medical risk,  up to and including death, or upon the discretion of your surgeon and anesthesiologist your  surgery may need to be rescheduled.     _X__ 1. Do not eat food after midnight the night before your procedure.                 No gum chewing or hard candies. You may drink clear liquids up to 2 hours                 before you are scheduled to arrive for your surgery- DO not drink clear                 liquids within 2 hours of the start of your surgery.                 Clear Liquids include:  water, G2 or                  Gatorade Zero (avoid Red/Purple/Blue), Black Coffee or Tea (Do not add                 anything to coffee or tea). ___x__2.   Complete the carbohydrate drink provided to you, 2 hours before arrival.  __X__2.  On the morning of surgery brush your teeth with toothpaste and water, you                may rinse your mouth with mouthwash if you wish.  Do not swallow any toothpaste of mouthwash.     _X__ 3.  No Alcohol for 24 hours before or after surgery.   _X__ 4.  Do Not Smoke or use e-cigarettes For 24 Hours Prior to Your Surgery.                 Do not use any chewable tobacco products for at least 6 hours prior to                 surgery.  ____  5.  Bring all medications with you on the day of surgery if instructed.   __x__  6.  Notify your doctor if there is any change in your medical condition      (cold, fever, infections).     Do not wear jewelry, make-up, hairpins, clips or nail polish. Do not wear lotions, powders, or perfumes. You may wear deodorant. Do not shave 48 hours prior to surgery. Men may shave face and neck. Do not bring valuables to the hospital.    [facility] is not responsible for any  belongings or valuables.  Contacts, dentures or bridgework may not be worn into surgery. Leave your suitcase in the car. After surgery it may be brought to your room. For patients admitted to the hospital, discharge time is determined by your treatment team.   Patients discharged the day of surgery will not be allowed to drive home.   Make arrangements for someone to be with you for the first 24 hours of your Same Day Discharge.    Please read over the following fact sheets that you were given:   _x___ Take these medicines the morning of surgery with A SIP OF WATER:    1. omeprazole (PRILOSEC) 40 MG capsule dose  the night before and the morning of the surgery  2. loratadine (CLARITIN) 10 MG tablet  3. HYDROcodone-acetaminophen (NORCO/VICODIN) 5-325 MG tablet if needed  4.gabapentin (NEURONTIN) 300 MG capsule  5.DULoxetine (CYMBALTA) 60 MG capsule  6.cyclobenzaprine (FLEXERIL) 5 MG tablet             7. amLODipine (NORVASC) 5 MG tablet ____ Fleet Enema (as directed)   __x__ Use CHG Soap (or wipes) as directed  ____ Use Benzoyl Peroxide Gel as instructed  __x__ Use inhalers on the day of surgery albuterol (VENTOLIN HFA) 108 (90 Base) MCG/ACT inhaler and bring with you to the hospital umeclidinium-vilanterol (ANORO ELLIPTA) 62.5-25 MCG/INH AEPB  ____ Stop metformin 2 days prior to surgery    ____ Take 1/2 of usual insulin dose the night before surgery. No insulin the morning          of surgery.   ____ Stop Coumadin/Plavix/aspirin on   __x__ Stop Anti-inflammatories meloxicam (MOBIC) 15 MG tablet today   ____ Stop supplements until after surgery.    __x__ Use  C-Pap at home after surgery.  No need to bring with you.

## 2019-07-17 ENCOUNTER — Encounter
Admission: RE | Admit: 2019-07-17 | Discharge: 2019-07-17 | Disposition: A | Discharge status: Home / Self Care | Payer: Medicare HMO | Source: Ambulatory Visit | Attending: Surgery | Admitting: Surgery

## 2019-07-17 DIAGNOSIS — Z01818 Encounter for other preprocedural examination: Secondary | ICD-10-CM | POA: Diagnosis not present

## 2019-07-17 LAB — BASIC METABOLIC PANEL
Anion gap: 10 (ref 5–15)
BUN: 18 mg/dL (ref 6–20)
CO2: 26 mmol/L (ref 22–32)
Calcium: 9.4 mg/dL (ref 8.9–10.3)
Chloride: 103 mmol/L (ref 98–111)
Creatinine, Ser: 0.85 mg/dL (ref 0.44–1.00)
GFR calc Af Amer: >60 mL/min (ref >60)
GFR calc non Af Amer: >60 mL/min (ref >60)
Glucose, Bld: 100 mg/dL — ABNORMAL HIGH (ref 70–99)
Potassium: 3.9 mmol/L (ref 3.5–5.1)
Sodium: 139 mmol/L (ref 135–145)

## 2019-07-17 LAB — CBC WITH DIFFERENTIAL/PLATELET
Abs Immature Granulocytes: 0.04 10*3/uL (ref 0.00–0.07)
Basophils Absolute: 0.1 10*3/uL (ref 0.0–0.1)
Basophils Relative: 1 %
Eosinophils Absolute: 0.4 10*3/uL (ref 0.0–0.5)
Eosinophils Relative: 3 %
HCT: 44.6 % (ref 36.0–46.0)
Hemoglobin: 14.8 g/dL (ref 12.0–15.0)
Immature Granulocytes: 0 %
Lymphocytes Relative: 27 %
Lymphs Abs: 3.2 10*3/uL (ref 0.7–4.0)
MCH: 31.6 pg (ref 26.0–34.0)
MCHC: 33.2 g/dL (ref 30.0–36.0)
MCV: 95.3 fL (ref 80.0–100.0)
Monocytes Absolute: 0.7 10*3/uL (ref 0.1–1.0)
Monocytes Relative: 6 %
Neutro Abs: 7.5 10*3/uL (ref 1.7–7.7)
Neutrophils Relative %: 63 %
Platelets: 245 10*3/uL (ref 150–400)
RBC: 4.68 MIL/uL (ref 3.87–5.11)
RDW: 11.9 % (ref 11.5–15.5)
WBC: 11.8 10*3/uL — ABNORMAL HIGH (ref 4.0–10.5)
nRBC: 0 % (ref 0.0–0.2)

## 2019-07-17 LAB — URINALYSIS, ROUTINE W REFLEX MICROSCOPIC
Bilirubin Urine: NEGATIVE
Glucose, UA: NEGATIVE mg/dL
Hgb urine dipstick: NEGATIVE
Ketones, ur: NEGATIVE mg/dL
Leukocytes,Ua: NEGATIVE
Nitrite: NEGATIVE
Protein, ur: NEGATIVE mg/dL
Specific Gravity, Urine: 1.012 (ref 1.005–1.030)
pH: 6 (ref 5.0–8.0)

## 2019-07-17 LAB — TYPE AND SCREEN
ABO/RH(D): B POS
Antibody Screen: NEGATIVE

## 2019-07-17 LAB — PROTIME-INR
INR: 0.9 (ref 0.8–1.2)
Prothrombin Time: 12 seconds (ref 11.4–15.2)

## 2019-07-17 LAB — APTT: aPTT: 30 seconds (ref 24–36)

## 2019-07-17 LAB — SURGICAL PCR SCREEN
MRSA, PCR: NEGATIVE
Staphylococcus aureus: NEGATIVE

## 2019-07-17 NOTE — Telephone Encounter (Signed)
Please see response

## 2019-07-18 ENCOUNTER — Ambulatory Visit: Payer: Medicaid Other | Admitting: Pulmonary Disease

## 2019-07-18 LAB — URINE CULTURE

## 2019-07-19 ENCOUNTER — Other Ambulatory Visit
Admission: RE | Admit: 2019-07-19 | Discharge: 2019-07-19 | Disposition: A | Discharge status: Home / Self Care | Payer: Medicare HMO | Source: Ambulatory Visit | Attending: Surgery | Admitting: Surgery

## 2019-07-19 ENCOUNTER — Other Ambulatory Visit: Payer: Self-pay

## 2019-07-19 DIAGNOSIS — Z20822 Contact with and (suspected) exposure to covid-19: Secondary | ICD-10-CM | POA: Diagnosis not present

## 2019-07-19 DIAGNOSIS — Z01812 Encounter for preprocedural laboratory examination: Secondary | ICD-10-CM | POA: Diagnosis present

## 2019-07-19 NOTE — Pre-Procedure Instructions (Addendum)
SECURE CHAT WITH DR POGGI:  Hi Dr Roland Rack! Pt having surgery wtih you 3-16 THA-Pt's UA came back negative but her UCX came back today with multiple species present, suggesting recollection. Do we need to have pt come back in to repeat UCX?  No because it most likely is contaminant. Thanks!  Ok Thanks so much!

## 2019-07-20 LAB — SARS CORONAVIRUS 2 (TAT 6-24 HRS): SARS Coronavirus 2: NEGATIVE

## 2019-07-23 ENCOUNTER — Inpatient Hospital Stay: Payer: Medicare HMO

## 2019-07-23 ENCOUNTER — Inpatient Hospital Stay
Admission: RE | Admit: 2019-07-23 | Discharge: 2019-07-24 | DRG: 470 | Disposition: A | Discharge status: Home Health | Payer: Medicare HMO | Attending: Surgery | Admitting: Surgery

## 2019-07-23 ENCOUNTER — Encounter: Payer: Self-pay | Admitting: Surgery

## 2019-07-23 ENCOUNTER — Inpatient Hospital Stay: Payer: Medicare HMO | Admitting: Anesthesiology

## 2019-07-23 ENCOUNTER — Other Ambulatory Visit: Payer: Self-pay

## 2019-07-23 ENCOUNTER — Encounter
Admission: RE | Disposition: A | Discharge status: Home Health | Payer: Self-pay | Source: Home / Self Care | Attending: Surgery

## 2019-07-23 DIAGNOSIS — B1920 Unspecified viral hepatitis C without hepatic coma: Secondary | ICD-10-CM | POA: Diagnosis present

## 2019-07-23 DIAGNOSIS — J45909 Unspecified asthma, uncomplicated: Secondary | ICD-10-CM | POA: Diagnosis present

## 2019-07-23 DIAGNOSIS — M1612 Unilateral primary osteoarthritis, left hip: Secondary | ICD-10-CM | POA: Diagnosis present

## 2019-07-23 DIAGNOSIS — Z96642 Presence of left artificial hip joint: Secondary | ICD-10-CM

## 2019-07-23 DIAGNOSIS — F319 Bipolar disorder, unspecified: Secondary | ICD-10-CM | POA: Diagnosis present

## 2019-07-23 DIAGNOSIS — Z20822 Contact with and (suspected) exposure to covid-19: Secondary | ICD-10-CM | POA: Diagnosis present

## 2019-07-23 DIAGNOSIS — K219 Gastro-esophageal reflux disease without esophagitis: Secondary | ICD-10-CM | POA: Diagnosis present

## 2019-07-23 DIAGNOSIS — E559 Vitamin D deficiency, unspecified: Secondary | ICD-10-CM | POA: Diagnosis present

## 2019-07-23 DIAGNOSIS — G4733 Obstructive sleep apnea (adult) (pediatric): Secondary | ICD-10-CM | POA: Diagnosis present

## 2019-07-23 DIAGNOSIS — Z79899 Other long term (current) drug therapy: Secondary | ICD-10-CM | POA: Diagnosis not present

## 2019-07-23 DIAGNOSIS — Z915 Personal history of self-harm: Secondary | ICD-10-CM | POA: Diagnosis not present

## 2019-07-23 DIAGNOSIS — M810 Age-related osteoporosis without current pathological fracture: Secondary | ICD-10-CM | POA: Diagnosis present

## 2019-07-23 DIAGNOSIS — E538 Deficiency of other specified B group vitamins: Secondary | ICD-10-CM | POA: Diagnosis present

## 2019-07-23 DIAGNOSIS — Z6841 Body Mass Index (BMI) 40.0 and over, adult: Secondary | ICD-10-CM

## 2019-07-23 DIAGNOSIS — I10 Essential (primary) hypertension: Secondary | ICD-10-CM | POA: Diagnosis present

## 2019-07-23 DIAGNOSIS — Z96651 Presence of right artificial knee joint: Secondary | ICD-10-CM | POA: Diagnosis present

## 2019-07-23 DIAGNOSIS — M069 Rheumatoid arthritis, unspecified: Secondary | ICD-10-CM | POA: Diagnosis present

## 2019-07-23 DIAGNOSIS — Z8614 Personal history of Methicillin resistant Staphylococcus aureus infection: Secondary | ICD-10-CM

## 2019-07-23 DIAGNOSIS — E785 Hyperlipidemia, unspecified: Secondary | ICD-10-CM | POA: Diagnosis present

## 2019-07-23 HISTORY — PX: TOTAL HIP ARTHROPLASTY: SHX124

## 2019-07-23 HISTORY — DX: Presence of left artificial hip joint: Z96.642

## 2019-07-23 LAB — GLUCOSE, CAPILLARY: Glucose-Capillary: 108 mg/dL — ABNORMAL HIGH (ref 70–99)

## 2019-07-23 SURGERY — ARTHROPLASTY, HIP, TOTAL,POSTERIOR APPROACH
Anesthesia: Spinal | Site: Hip | Laterality: Left

## 2019-07-23 MED ORDER — EPHEDRINE 5 MG/ML INJ
INTRAVENOUS | Status: AC
  Filled 2019-07-23: qty 10

## 2019-07-23 MED ORDER — FENTANYL CITRATE (PF) 100 MCG/2ML IJ SOLN
INTRAMUSCULAR | Status: AC
  Filled 2019-07-23: qty 2

## 2019-07-23 MED ORDER — CEFAZOLIN SODIUM-DEXTROSE 2-4 GM/100ML-% IV SOLN
INTRAVENOUS | Status: AC
  Filled 2019-07-23: qty 100

## 2019-07-23 MED ORDER — OXYCODONE HCL 5 MG PO TABS
5.0000 mg | ORAL_TABLET | ORAL | Status: DC | PRN
  Administered 2019-07-23: 10 mg via ORAL
  Administered 2019-07-24: 5 mg via ORAL
  Filled 2019-07-23: qty 2
  Filled 2019-07-23: qty 1

## 2019-07-23 MED ORDER — ACETAMINOPHEN 500 MG PO TABS
ORAL_TABLET | ORAL | Status: AC
  Filled 2019-07-23: qty 2

## 2019-07-23 MED ORDER — BISACODYL 10 MG RE SUPP: 10.0000 mg | Freq: Every day | RECTAL | Status: DC | PRN

## 2019-07-23 MED ORDER — BUPIVACAINE IN DEXTROSE 0.75-8.25 % IT SOLN
INTRATHECAL | Status: DC | PRN
  Administered 2019-07-23: 2.5 mL via INTRATHECAL

## 2019-07-23 MED ORDER — MIDAZOLAM HCL 2 MG/2ML IJ SOLN
INTRAMUSCULAR | Status: AC
  Filled 2019-07-23: qty 2

## 2019-07-23 MED ORDER — PROPOFOL 10 MG/ML IV BOLUS
INTRAVENOUS | Status: AC
  Filled 2019-07-23: qty 20

## 2019-07-23 MED ORDER — TRAMADOL HCL 50 MG PO TABS
ORAL_TABLET | ORAL | Status: AC
  Filled 2019-07-23: qty 1

## 2019-07-23 MED ORDER — ATENOLOL 25 MG PO TABS
25.0000 mg | ORAL_TABLET | Freq: Every day | ORAL | Status: DC
  Administered 2019-07-24: 25 mg via ORAL
  Filled 2019-07-23: qty 1

## 2019-07-23 MED ORDER — CEFAZOLIN SODIUM-DEXTROSE 2-4 GM/100ML-% IV SOLN
2.0000 g | INTRAVENOUS | Status: AC
  Administered 2019-07-23: 2 g via INTRAVENOUS

## 2019-07-23 MED ORDER — VASOPRESSIN 20 UNIT/ML IV SOLN
INTRAVENOUS | Status: DC | PRN
  Administered 2019-07-23: 2 [IU] via INTRAVENOUS

## 2019-07-23 MED ORDER — ACETAMINOPHEN 500 MG PO TABS
1000.0000 mg | ORAL_TABLET | Freq: Four times a day (QID) | ORAL | Status: AC
  Administered 2019-07-23 – 2019-07-24 (×3): 1000 mg via ORAL
  Filled 2019-07-23 (×3): qty 2

## 2019-07-23 MED ORDER — PHENYLEPHRINE HCL (PRESSORS) 10 MG/ML IV SOLN
INTRAVENOUS | Status: AC
  Filled 2019-07-23: qty 1

## 2019-07-23 MED ORDER — GABAPENTIN 300 MG PO CAPS
600.0000 mg | ORAL_CAPSULE | Freq: Two times a day (BID) | ORAL | Status: DC
  Administered 2019-07-23 – 2019-07-24 (×2): 600 mg via ORAL
  Filled 2019-07-23 (×2): qty 2

## 2019-07-23 MED ORDER — PANTOPRAZOLE SODIUM 40 MG PO TBEC
40.0000 mg | DELAYED_RELEASE_TABLET | Freq: Every day | ORAL | Status: DC
  Administered 2019-07-24: 40 mg via ORAL
  Filled 2019-07-23: qty 1

## 2019-07-23 MED ORDER — OXYCODONE HCL 5 MG/5ML PO SOLN: 5.0000 mg | Freq: Once | ORAL | Status: DC | PRN

## 2019-07-23 MED ORDER — DULOXETINE HCL 60 MG PO CPEP
60.0000 mg | ORAL_CAPSULE | Freq: Two times a day (BID) | ORAL | Status: DC
  Administered 2019-07-23 – 2019-07-24 (×2): 60 mg via ORAL
  Filled 2019-07-23 (×3): qty 1

## 2019-07-23 MED ORDER — CEFAZOLIN SODIUM-DEXTROSE 2-4 GM/100ML-% IV SOLN
2.0000 g | Freq: Three times a day (TID) | INTRAVENOUS | Status: AC
  Administered 2019-07-23 – 2019-07-24 (×2): 2 g via INTRAVENOUS
  Filled 2019-07-23 (×2): qty 100

## 2019-07-23 MED ORDER — TRAMADOL HCL 50 MG PO TABS
50.0000 mg | ORAL_TABLET | Freq: Four times a day (QID) | ORAL | Status: DC
  Administered 2019-07-23 – 2019-07-24 (×5): 50 mg via ORAL
  Filled 2019-07-23 (×4): qty 1

## 2019-07-23 MED ORDER — FLEET ENEMA 7-19 GM/118ML RE ENEM: 1.0000 | ENEMA | Freq: Once | RECTAL | Status: DC | PRN

## 2019-07-23 MED ORDER — CEFAZOLIN SODIUM-DEXTROSE 2-4 GM/100ML-% IV SOLN: 2.0000 g | Freq: Three times a day (TID) | INTRAVENOUS | Status: DC

## 2019-07-23 MED ORDER — ONDANSETRON HCL 4 MG/2ML IJ SOLN
INTRAMUSCULAR | Status: DC | PRN
  Administered 2019-07-23: 4 mg via INTRAVENOUS

## 2019-07-23 MED ORDER — BUPIVACAINE HCL (PF) 0.5 % IJ SOLN
INTRAMUSCULAR | Status: AC
  Filled 2019-07-23: qty 30

## 2019-07-23 MED ORDER — TRANEXAMIC ACID 1000 MG/10ML IV SOLN
INTRAVENOUS | Status: AC
  Filled 2019-07-23: qty 10

## 2019-07-23 MED ORDER — ACETAMINOPHEN 10 MG/ML IV SOLN
INTRAVENOUS | Status: AC
  Filled 2019-07-23: qty 100

## 2019-07-23 MED ORDER — ACETAMINOPHEN 325 MG PO TABS: 325.0000 mg | ORAL_TABLET | Freq: Four times a day (QID) | ORAL | Status: DC | PRN

## 2019-07-23 MED ORDER — HYDROMORPHONE HCL 1 MG/ML IJ SOLN: 0.2500 mg | INTRAMUSCULAR | Status: DC | PRN

## 2019-07-23 MED ORDER — KETOROLAC TROMETHAMINE 30 MG/ML IJ SOLN
INTRAMUSCULAR | Status: AC
  Administered 2019-07-23: 30 mg via INTRAVENOUS
  Filled 2019-07-23: qty 1

## 2019-07-23 MED ORDER — AMLODIPINE BESYLATE 5 MG PO TABS
5.0000 mg | ORAL_TABLET | Freq: Every day | ORAL | Status: DC
  Administered 2019-07-24: 5 mg via ORAL
  Filled 2019-07-23: qty 1

## 2019-07-23 MED ORDER — SODIUM CHLORIDE 0.9 % IV SOLN: INTRAVENOUS | Status: DC

## 2019-07-23 MED ORDER — ONDANSETRON HCL 4 MG PO TABS: 4.0000 mg | ORAL_TABLET | Freq: Four times a day (QID) | ORAL | Status: DC | PRN

## 2019-07-23 MED ORDER — VITAMIN B-12 1000 MCG PO TABS
1000.0000 ug | ORAL_TABLET | Freq: Every day | ORAL | Status: DC
  Administered 2019-07-24: 1000 ug via ORAL
  Filled 2019-07-23: qty 1

## 2019-07-23 MED ORDER — TRANEXAMIC ACID 1000 MG/10ML IV SOLN
INTRAVENOUS | Status: DC | PRN
  Administered 2019-07-23: 1000 mg via TOPICAL

## 2019-07-23 MED ORDER — LACTATED RINGERS IV SOLN: INTRAVENOUS | Status: DC

## 2019-07-23 MED ORDER — MIDAZOLAM HCL 5 MG/5ML IJ SOLN
INTRAMUSCULAR | Status: DC | PRN
  Administered 2019-07-23: .5 mg via INTRAVENOUS
  Administered 2019-07-23: 1 mg via INTRAVENOUS
  Administered 2019-07-23: .5 mg via INTRAVENOUS

## 2019-07-23 MED ORDER — BUPIVACAINE-EPINEPHRINE (PF) 0.5% -1:200000 IJ SOLN
INTRAMUSCULAR | Status: DC | PRN
  Administered 2019-07-23: 30 mL

## 2019-07-23 MED ORDER — CHLORHEXIDINE GLUCONATE 4 % EX LIQD
60.0000 mL | Freq: Once | CUTANEOUS | Status: AC
  Administered 2019-07-23: 4 via TOPICAL

## 2019-07-23 MED ORDER — BUPIVACAINE LIPOSOME 1.3 % IJ SUSP
INTRAMUSCULAR | Status: AC
  Filled 2019-07-23: qty 20

## 2019-07-23 MED ORDER — VASOPRESSIN 20 UNIT/ML IV SOLN
INTRAVENOUS | Status: AC
  Filled 2019-07-23: qty 1

## 2019-07-23 MED ORDER — FENTANYL CITRATE (PF) 100 MCG/2ML IJ SOLN
25.0000 ug | INTRAMUSCULAR | Status: DC | PRN
  Administered 2019-07-23 (×3): 25 ug via INTRAVENOUS

## 2019-07-23 MED ORDER — CEFAZOLIN SODIUM-DEXTROSE 2-4 GM/100ML-% IV SOLN
INTRAVENOUS | Status: AC
  Administered 2019-07-23: 2 g via INTRAVENOUS
  Filled 2019-07-23: qty 100

## 2019-07-23 MED ORDER — SODIUM CHLORIDE 0.9 % IV SOLN
INTRAVENOUS | Status: DC | PRN
  Administered 2019-07-23: 60 mL

## 2019-07-23 MED ORDER — UMECLIDINIUM-VILANTEROL 62.5-25 MCG/INH IN AEPB
1.0000 | INHALATION_SPRAY | Freq: Every day | RESPIRATORY_TRACT | Status: DC
  Administered 2019-07-24: 1 via RESPIRATORY_TRACT
  Filled 2019-07-23: qty 14

## 2019-07-23 MED ORDER — METOCLOPRAMIDE HCL 5 MG/ML IJ SOLN: 5.0000 mg | Freq: Three times a day (TID) | INTRAMUSCULAR | Status: DC | PRN

## 2019-07-23 MED ORDER — OXYCODONE HCL 5 MG PO TABS: 5.0000 mg | ORAL_TABLET | Freq: Once | ORAL | Status: DC | PRN

## 2019-07-23 MED ORDER — SODIUM CHLORIDE 0.9 % IV SOLN
INTRAVENOUS | Status: DC | PRN
  Administered 2019-07-23: 35 ug/min via INTRAVENOUS

## 2019-07-23 MED ORDER — FENTANYL CITRATE (PF) 100 MCG/2ML IJ SOLN
INTRAMUSCULAR | Status: AC
  Administered 2019-07-23: 25 ug via INTRAVENOUS
  Filled 2019-07-23: qty 2

## 2019-07-23 MED ORDER — ATORVASTATIN CALCIUM 10 MG PO TABS
10.0000 mg | ORAL_TABLET | Freq: Every day | ORAL | Status: DC
  Administered 2019-07-23: 10 mg via ORAL
  Filled 2019-07-23: qty 1

## 2019-07-23 MED ORDER — ONDANSETRON HCL 4 MG/2ML IJ SOLN: 4.0000 mg | Freq: Four times a day (QID) | INTRAMUSCULAR | Status: DC | PRN

## 2019-07-23 MED ORDER — ACETAMINOPHEN 10 MG/ML IV SOLN
INTRAVENOUS | Status: DC | PRN
  Administered 2019-07-23: 1000 mg via INTRAVENOUS

## 2019-07-23 MED ORDER — ENOXAPARIN SODIUM 40 MG/0.4ML ~~LOC~~ SOLN
40.0000 mg | SUBCUTANEOUS | Status: DC
  Administered 2019-07-24: 40 mg via SUBCUTANEOUS
  Filled 2019-07-23: qty 0.4

## 2019-07-23 MED ORDER — SODIUM CHLORIDE FLUSH 0.9 % IV SOLN
INTRAVENOUS | Status: AC
  Filled 2019-07-23: qty 40

## 2019-07-23 MED ORDER — ALBUTEROL SULFATE (2.5 MG/3ML) 0.083% IN NEBU: 2.5000 mg | INHALATION_SOLUTION | RESPIRATORY_TRACT | Status: DC | PRN

## 2019-07-23 MED ORDER — PROPOFOL 500 MG/50ML IV EMUL
INTRAVENOUS | Status: DC | PRN
  Administered 2019-07-23: 25 ug/kg/min via INTRAVENOUS

## 2019-07-23 MED ORDER — DOCUSATE SODIUM 100 MG PO CAPS
100.0000 mg | ORAL_CAPSULE | Freq: Two times a day (BID) | ORAL | Status: DC
  Administered 2019-07-23 – 2019-07-24 (×2): 100 mg via ORAL
  Filled 2019-07-23 (×2): qty 1

## 2019-07-23 MED ORDER — MAGNESIUM HYDROXIDE 400 MG/5ML PO SUSP
30.0000 mL | Freq: Every day | ORAL | Status: DC | PRN
  Administered 2019-07-24: 30 mL via ORAL
  Filled 2019-07-23: qty 30

## 2019-07-23 MED ORDER — KETOROLAC TROMETHAMINE 15 MG/ML IJ SOLN
15.0000 mg | Freq: Four times a day (QID) | INTRAMUSCULAR | Status: AC
  Administered 2019-07-23 – 2019-07-24 (×3): 15 mg via INTRAVENOUS
  Filled 2019-07-23 (×3): qty 1

## 2019-07-23 MED ORDER — FENTANYL CITRATE (PF) 100 MCG/2ML IJ SOLN
INTRAMUSCULAR | Status: DC | PRN
  Administered 2019-07-23 (×2): 25 ug via INTRAVENOUS

## 2019-07-23 MED ORDER — VITAMIN D (ERGOCALCIFEROL) 1.25 MG (50000 UNIT) PO CAPS: 50000.0000 [IU] | ORAL_CAPSULE | ORAL | Status: DC

## 2019-07-23 MED ORDER — KETOROLAC TROMETHAMINE 30 MG/ML IJ SOLN: 30.0000 mg | Freq: Once | INTRAMUSCULAR | Status: AC

## 2019-07-23 MED ORDER — ALBUTEROL SULFATE HFA 108 (90 BASE) MCG/ACT IN AERS: 1.0000 | INHALATION_SPRAY | RESPIRATORY_TRACT | Status: DC | PRN

## 2019-07-23 MED ORDER — CYCLOBENZAPRINE HCL 10 MG PO TABS: 5.0000 mg | ORAL_TABLET | Freq: Two times a day (BID) | ORAL | Status: DC | PRN

## 2019-07-23 MED ORDER — LOSARTAN POTASSIUM 50 MG PO TABS
100.0000 mg | ORAL_TABLET | Freq: Every day | ORAL | Status: DC
  Administered 2019-07-24: 100 mg via ORAL
  Filled 2019-07-23: qty 2

## 2019-07-23 MED ORDER — EPINEPHRINE PF 1 MG/ML IJ SOLN
INTRAMUSCULAR | Status: AC
  Filled 2019-07-23: qty 1

## 2019-07-23 MED ORDER — KETOROLAC TROMETHAMINE 15 MG/ML IJ SOLN: 15.0000 mg | Freq: Four times a day (QID) | INTRAMUSCULAR | Status: DC

## 2019-07-23 MED ORDER — LORATADINE 10 MG PO TABS: 10.0000 mg | ORAL_TABLET | Freq: Every day | ORAL | Status: DC | PRN

## 2019-07-23 MED ORDER — METOCLOPRAMIDE HCL 10 MG PO TABS: 5.0000 mg | ORAL_TABLET | Freq: Three times a day (TID) | ORAL | Status: DC | PRN

## 2019-07-23 MED ORDER — CEFAZOLIN SODIUM-DEXTROSE 2-4 GM/100ML-% IV SOLN: 2.0000 g | Freq: Four times a day (QID) | INTRAVENOUS | Status: DC

## 2019-07-23 MED ORDER — DIPHENHYDRAMINE HCL 12.5 MG/5ML PO ELIX: 12.5000 mg | ORAL_SOLUTION | ORAL | Status: DC | PRN

## 2019-07-23 MED ORDER — PROPOFOL 500 MG/50ML IV EMUL
INTRAVENOUS | Status: AC
  Filled 2019-07-23: qty 50

## 2019-07-23 SURGICAL SUPPLY — 55 items
BLADE SAGITTAL WIDE XTHICK NO (BLADE) ×2 IMPLANT
BLADE SURG SZ20 CARB STEEL (BLADE) ×2 IMPLANT
CANISTER SUCT 1200ML W/VALVE (MISCELLANEOUS) ×2 IMPLANT
CANISTER SUCT 3000ML PPV (MISCELLANEOUS) ×4 IMPLANT
CHLORAPREP W/TINT 26 (MISCELLANEOUS) ×3 IMPLANT
COVER WAND RF STERILE (DRAPES) ×2 IMPLANT
DRAPE 3/4 80X56 (DRAPES) ×2 IMPLANT
DRAPE INCISE IOBAN 66X60 STRL (DRAPES) ×2 IMPLANT
DRAPE SURG 17X11 SM STRL (DRAPES) ×4 IMPLANT
DRSG OPSITE POSTOP 4X10 (GAUZE/BANDAGES/DRESSINGS) ×1 IMPLANT
DRSG OPSITE POSTOP 4X12 (GAUZE/BANDAGES/DRESSINGS) ×1 IMPLANT
ELECT BLADE 6.5 EXT (BLADE) ×2 IMPLANT
ELECT CAUTERY BLADE 6.4 (BLADE) ×2 IMPLANT
GLOVE BIO SURGEON STRL SZ7.5 (GLOVE) ×8 IMPLANT
GLOVE BIO SURGEON STRL SZ8 (GLOVE) ×8 IMPLANT
GLOVE BIOGEL PI IND STRL 8 (GLOVE) ×1 IMPLANT
GLOVE BIOGEL PI INDICATOR 8 (GLOVE) ×1
GLOVE INDICATOR 8.0 STRL GRN (GLOVE) ×2 IMPLANT
GOWN STRL REUS W/ TWL LRG LVL3 (GOWN DISPOSABLE) ×1 IMPLANT
GOWN STRL REUS W/ TWL XL LVL3 (GOWN DISPOSABLE) ×1 IMPLANT
GOWN STRL REUS W/TWL LRG LVL3 (GOWN DISPOSABLE) ×2
GOWN STRL REUS W/TWL XL LVL3 (GOWN DISPOSABLE) ×1
HEAD CERAMIC DELTA 36MM -3 (Hips) ×1 IMPLANT
HIP SHELL ACETAB 3H 50MM (Hips) ×2 IMPLANT
HOOD PEEL AWAY FLYTE STAYCOOL (MISCELLANEOUS) ×6 IMPLANT
KIT TURNOVER KIT A (KITS) ×2 IMPLANT
LINER ACE G7 36 SZ D HIGH WALL (Liner) ×1 IMPLANT
NDL FILTER BLUNT 18X1 1/2 (NEEDLE) ×1 IMPLANT
NDL SAFETY ECLIPSE 18X1.5 (NEEDLE) ×2 IMPLANT
NDL SPNL 20GX3.5 QUINCKE YW (NEEDLE) ×1 IMPLANT
NEEDLE FILTER BLUNT 18X 1/2SAF (NEEDLE) ×1
NEEDLE FILTER BLUNT 18X1 1/2 (NEEDLE) ×1 IMPLANT
NEEDLE HYPO 18GX1.5 SHARP (NEEDLE) ×2
NEEDLE SPNL 20GX3.5 QUINCKE YW (NEEDLE) ×2 IMPLANT
PACK HIP PROSTHESIS (MISCELLANEOUS) ×2 IMPLANT
PILLOW ABDUCTION FOAM SM (MISCELLANEOUS) ×2 IMPLANT
PIN STEINMAN 3/16 (PIN) ×2 IMPLANT
PULSAVAC PLUS IRRIG FAN TIP (DISPOSABLE) ×2
SHELL ACETAB HIP 3H 50MM (Hips) IMPLANT
SOL .9 NS 3000ML IRR  AL (IV SOLUTION) ×1
SOL .9 NS 3000ML IRR UROMATIC (IV SOLUTION) ×1 IMPLANT
SPONGE LAP 18X18 RF (DISPOSABLE) ×1 IMPLANT
STAPLER SKIN PROX 35W (STAPLE) ×2 IMPLANT
STEM COLLARLESS FULL 11X135 (Stem) ×1 IMPLANT
SUT TICRON 2-0 30IN 311381 (SUTURE) ×6 IMPLANT
SUT VIC AB 0 CT1 36 (SUTURE) ×2 IMPLANT
SUT VIC AB 1 CT1 36 (SUTURE) ×2 IMPLANT
SUT VIC AB 2-0 CT1 (SUTURE) ×6 IMPLANT
SYR 10ML LL (SYRINGE) ×2 IMPLANT
SYR 20ML LL LF (SYRINGE) ×2 IMPLANT
SYR 30ML LL (SYRINGE) ×4 IMPLANT
TAPE TRANSPORE STRL 2 31045 (GAUZE/BANDAGES/DRESSINGS) ×2 IMPLANT
TIP FAN IRRIG PULSAVAC PLUS (DISPOSABLE) ×1 IMPLANT
TRAY FOLEY MTR SLVR 16FR STAT (SET/KITS/TRAYS/PACK) ×1 IMPLANT
WATER STERILE IRR 1000ML POUR (IV SOLUTION) ×2 IMPLANT

## 2019-07-23 NOTE — Anesthesia Procedure Notes (Signed)
Procedure Name: MAC Date/Time: 07/23/2019 7:45 AM Performed by: Allean Found, CRNA Pre-anesthesia Checklist: Patient identified, Emergency Drugs available, Suction available, Patient being monitored and Timeout performed Oxygen Delivery Method: Nasal cannula Placement Confirmation: positive ETCO2

## 2019-07-23 NOTE — TOC Progression Note (Signed)
Transition of Care [facility]) - Progression Note    Patient Details  Name: Susan [name] MRN: [id number] Date of Birth: October 12, 1960  Transition of Care [facility]) CM/SW [place], RN Phone Number: 07/23/2019, 4:13 PM  Clinical Narrative:     Helene Kelp has notified me from [facility] that they can't accept the patient due to not being in network with [facility], I reached out to [place] with [facility] to see if they can accept the patient, Will let me know        Expected Discharge Plan and Services                                                 Social Determinants of Health (SDOH) Interventions    Readmission Risk Interventions No flowsheet data found.

## 2019-07-23 NOTE — Progress Notes (Signed)
6 out of 10 pain  Po tramadol started   Pt sleeps periodically

## 2019-07-23 NOTE — Anesthesia Preprocedure Evaluation (Signed)
Anesthesia Evaluation  Patient identified by MRN, date of birth, ID band Patient awake    Reviewed: Allergy & Precautions, H&P , NPO status , Patient's Chart, lab work & pertinent test results  History of Anesthesia Complications Negative for: history of anesthetic complications  Airway Mallampati: III  TM Distance: <3 FB Neck ROM: full    Dental  (+) Chipped, Poor Dentition, Missing   Pulmonary shortness of breath and with exertion, asthma , sleep apnea and Continuous Positive Airway Pressure Ventilation , COPD, Current Smoker,           Cardiovascular Exercise Tolerance: Good hypertension, (-) angina(-) Past MI and (-) DOE      Neuro/Psych PSYCHIATRIC DISORDERS  Neuromuscular disease    GI/Hepatic GERD  Medicated and Controlled,(+) Hepatitis -  Endo/Other  Hypothyroidism   Renal/GU      Musculoskeletal  (+) Arthritis ,   Abdominal   Peds  Hematology negative hematology ROS (+)   Anesthesia Other Findings Past Medical History: No date: ADD (attention deficit disorder) No date: Alcohol abuse No date: Anemia No date: Anxiety 02/20/2018: Aortic atherosclerosis (HCC)     Comment:  Chest CT Sept 2019 No date: Arthritis     Comment:  rheumatoid arthritis No date: Asthma No date: Bipolar disorder ([place]) 02/20/2018: Centrilobular emphysema ([place])     Comment:  Chest CT Sept 2019 No date: Constipation 09/28/2016: Degenerative disc disease at L5-S1 level     Comment:  See ortho note May 2018 No date: Depression     Comment:  bipolar, hx of suicide attempt No date: Drug use No date: Dyspnea     Comment:  with exertion No date: GERD (gastroesophageal reflux disease) No date: H/O suicide attempt     Comment:  slit wrists 06/26/2014: Hepatitis C No date: Hip pain No date: History of alcohol abuse 2013: History of diverticulitis No date: History of hepatitis C     Comment:  HEP "C"--three years ago 2013: History  of MRSA infection No date: HLD (hyperlipidemia) No date: Hypertension No date: Hypothyroidism 11/08/2012: Incisional hernia No date: Knee pain Nov 2013: MRSA carrier No date: Multinodular thyroid No date: OSA (obstructive sleep apnea)     Comment:  managed by Dr. Manuella Ghazi No date: Osteoporosis 09/24/2015: Post-traumatic osteoarthritis of right knee No date: Prediabetes 11/08/2012: Recurrent ventral hernia No date: Sleep apnea     Comment:   Dx 3 years ago. Use C-PAP 05/10/2016: Status post total right knee replacement using cement No date: Thyroid disease     Comment:  Goiter No date: Vitamin B12 deficiency No date: Vitamin D deficiency disease  Past Surgical History: No date: BILATERAL SALPINGOOPHORECTOMY     Comment:  due to abnormal mass No date: BREAST BIOPSY; Left     Comment:  neg 20 yrs ago: BREAST SURGERY; Left No date: CESAREAN SECTION No date: COLONOSCOPY 10/16/2017: COLONOSCOPY WITH PROPOFOL; N/A     Comment:  Procedure: COLONOSCOPY WITH PROPOFOL;  Surgeon: Jonathon Bellows, MD;  Location: [facility] ENDOSCOPY;  Service:               Gastroenterology;  Laterality: N/A; 06/2011, July 2014: HERNIA REPAIR     Comment:  Ventral wall repair with Physiomesh No date: HERNIA REPAIR     Comment:  2nd.vental wall repair No date: JOINT REPLACEMENT; Right     Comment:  knee No date: TONSILLECTOMY 05/10/2016: TOTAL KNEE ARTHROPLASTY; Right  Comment:  Procedure: TOTAL KNEE ARTHROPLASTY;  Surgeon: Corky Mull, MD;  Location: [facility] ORS;  Service: Orthopedics;                Laterality: Right; No date: TUBAL LIGATION     Reproductive/Obstetrics negative OB ROS                             Anesthesia Physical Anesthesia Plan  ASA: III  Anesthesia Plan: Spinal   Post-op Pain Management:    Induction:   PONV Risk Score and Plan:   Airway Management Planned: Natural Airway and Nasal Cannula  Additional Equipment:   Intra-op  Plan:   Post-operative Plan:   Informed Consent: I have reviewed the patients History and Physical, chart, labs and discussed the procedure including the risks, benefits and alternatives for the proposed anesthesia with the patient or authorized representative who has indicated his/her understanding and acceptance.     Dental Advisory Given  Plan Discussed with: Anesthesiologist, CRNA and Surgeon  Anesthesia Plan Comments: (Patient reports no bleeding problems and no anticoagulant use.  Plan for spinal with backup GA  Patient consented for risks of anesthesia including but not limited to:  - adverse reactions to medications - risk of bleeding, infection, nerve damage and headache - risk of failed spinal - damage to teeth, lips or other oral mucosa - sore throat or hoarseness - Damage to heart, brain, lungs or loss of life  Patient voiced understanding.)        Anesthesia Quick Evaluation

## 2019-07-23 NOTE — H&P (Signed)
Paper H&P to be scanned into permanent record. H&P reviewed and patient re-examined. No changes. 

## 2019-07-23 NOTE — Progress Notes (Signed)
Bladder scanner done  391 of urine  Straight cath with 400cc of yellow urine.

## 2019-07-23 NOTE — Transfer of Care (Signed)
Immediate Anesthesia Transfer of Care Note  Patient: Susan [name]  Procedure(s) Performed: TOTAL HIP ARTHROPLASTY (Left Hip)  Patient Location: PACU  Anesthesia Type:Spinal  Level of Consciousness: awake, alert  and oriented  Airway & Oxygen Therapy: Patient Spontanous Breathing and Patient connected to nasal cannula oxygen  Post-op Assessment: Report given to RN and Post -op Vital signs reviewed and stable  Post vital signs: Reviewed and stable  Last Vitals:  Vitals Value Taken Time  BP 113/45 07/23/19 1001  Temp    Pulse 65 07/23/19 1005  Resp 15 07/23/19 1005  SpO2 98 % 07/23/19 1005  Vitals shown include unvalidated device data.  Last Pain:  Vitals:   07/23/19 0640  TempSrc: Tympanic  PainSc: 5          Complications: No apparent anesthesia complications

## 2019-07-23 NOTE — Evaluation (Signed)
Physical Therapy Evaluation Patient Details Name: Susan [name] MRN: [id number] DOB: 02/19/1961 Today's Date: 07/23/2019   History of Present Illness  59 y/o female s/p L total hip replacement (posterior approach) on 3/16.  Clinical Impression  Pt did very weill with POD0 total hip replacement PT session.  She showed ability to do SLRs and do most hip exercises even against light resistance.  Pt was eager to get up and walk and again did relatively well with this going 35 ft with slow but consistent cadence and no c/o increased pain.  Pt eager to go home as soon as possible and is pleasant and motivated t/o the session.     Follow Up Recommendations Home health PT;Follow surgeon's recommendation for DC plan and follow-up therapies    Equipment Recommendations  None recommended by PT    Recommendations for Other Services       Precautions / Restrictions Precautions Precautions: Posterior Hip Restrictions Weight Bearing Restrictions: Yes LLE Weight Bearing: Weight bearing as tolerated      Mobility  Bed Mobility Overal bed mobility: Needs Assistance Bed Mobility: Supine to Sit     Supine to sit: Min guard     General bed mobility comments: heavy use of UEs/rails, able to rise w/o direct assist while maintaining hip precautions  Transfers Overall transfer level: Modified independent Equipment used: Rolling walker (2 wheeled)             General transfer comment: Pt is able to rise to standing with good confidence and safety  Ambulation/Gait Ambulation/Gait assistance: Min guard Gait Distance (Feet): 35 Feet Assistive device: Rolling walker (2 wheeled)       General Gait Details: Pt was able to assume consistent cadence relatively quickly with only very minimal limp on L, pt's O2 (low 90s) and HR (~100 bpm) remained stable t/o the effort.  Stairs            Wheelchair Mobility    Modified Rankin (Stroke Patients Only)       Balance Overall  balance assessment: Modified Independent                                           Pertinent Vitals/Pain Pain Assessment: 0-10 Pain Score: 4  Pain Location: L groin    Home Living Family/patient expects to be discharged to:: Private residence Living Arrangements: Children;Non-relatives/Friends Available Help at Discharge: Family;Available 24 hours/day   Home Access: Stairs to enter Entrance Stairs-Rails: Can reach both Entrance Stairs-Number of Steps: 3 Home Layout: Able to live on main level with bedroom/bathroom Home Equipment: Shower seat;Grab bars - tub/shower;Walker - 2 wheels;Walker - 4 wheels      Prior Function Level of Independence: Independent         Comments: Pt is able to ambulate and be relatively active     Hand Dominance        Extremity/Trunk Assessment   Upper Extremity Assessment Upper Extremity Assessment: Overall WFL for tasks assessed    Lower Extremity Assessment Lower Extremity Assessment: Overall WFL for tasks assessed       Communication   Communication: No difficulties  Cognition Arousal/Alertness: Awake/alert Behavior During Therapy: WFL for tasks assessed/performed Overall Cognitive Status: Within Functional Limits for tasks assessed  General Comments      Exercises Total Joint Exercises Ankle Circles/Pumps: AROM;10 reps Quad Sets: Strengthening;5 reps Short Arc Quad: Strengthening;10 reps Heel Slides: AROM;10 reps(with resisted leg extensions) Hip ABduction/ADduction: AROM;Strengthening;5 reps Straight Leg Raises: AROM;5 reps   Assessment/Plan    PT Assessment Patient needs continued PT services  PT Problem List Decreased strength;Decreased range of motion;Decreased activity tolerance;Decreased balance;Decreased mobility;Decreased knowledge of use of DME;Decreased safety awareness;Pain       PT Treatment Interventions DME instruction;Gait  training;Stair training;Functional mobility training;Therapeutic activities;Therapeutic exercise;Balance training;Neuromuscular re-education;Patient/family education    PT Goals (Current goals can be found in the Care Plan section)  Acute Rehab PT Goals Patient Stated Goal: Go home tomorrow PT Goal Formulation: With patient Time For Goal Achievement: 08/06/19 Potential to Achieve Goals: Good    Frequency BID   Barriers to discharge        Co-evaluation               AM-PAC PT "6 Clicks" Mobility  Outcome Measure Help needed turning from your back to your side while in a flat bed without using bedrails?: A Little Help needed moving from lying on your back to sitting on the side of a flat bed without using bedrails?: A Little Help needed moving to and from a bed to a chair (including a wheelchair)?: A Little Help needed standing up from a chair using your arms (e.g., wheelchair or bedside chair)?: A Little Help needed to walk in hospital room?: A Little Help needed climbing 3-5 steps with a railing? : A Little 6 Click Score: 18    End of Session Equipment Utilized During Treatment: Gait belt Activity Tolerance: Patient tolerated treatment well Patient left: with chair alarm set;with call bell/phone within reach   PT Visit Diagnosis: Muscle weakness (generalized) (M62.81);Difficulty in walking, not elsewhere classified (R26.2);Pain Pain - Right/Left: Left Pain - part of body: Hip    Time: [id number] PT Time Calculation (min) (ACUTE ONLY): 45 min   Charges:   PT Evaluation $PT Eval Low Complexity: 1 Low PT Treatments $Gait Training: 8-22 mins $Therapeutic Exercise: 8-22 mins        Kreg Shropshire, DPT 07/23/2019, 6:13 PM

## 2019-07-23 NOTE — TOC Progression Note (Signed)
Transition of Care [facility]) - Progression Note    Patient Details  Name: JEIMMY MEWHINNEY MRN: [id number] Date of Birth: [birth year]  [facility]) CM/SW [place], RN Phone Number: 07/23/2019, 3:13 PM  Clinical Narrative:     Requested the price of lovenox will notify the patient once obtained      Expected Discharge Plan and Services                                                 Social Determinants of Health (SDOH) Interventions    Readmission Risk Interventions No flowsheet data found.

## 2019-07-23 NOTE — Progress Notes (Signed)
Bladder scan for 509  And in and out for 500

## 2019-07-23 NOTE — Op Note (Signed)
07/23/2019  9:58 AM  Patient:   Susan [name]  Pre-Op Diagnosis:   Degenerative joint disease, left hip.  Post-Op Diagnosis:   Same.  Procedure:   Left total hip arthroplasty.  Surgeon:   Pascal Lux, MD  Assistant:   Cameron Proud, PA-C  Anesthesia:   Spinal  Findings:   As above.  Complications:   None  EBL:   300 cc  Fluids:   700 cc crystalloid  UOP:   None  TT:   None  Drains:   None  Closure:   Staples  Implants:   Biomet press-fit system with a #11 laterally offset Echo femoral stem, a 50 mm acetabular shell with an E-poly hi-wall liner, and a 36 mm ceramic head with a -3 mm neck.  Brief Clinical Note:   The patient is a 59 year old female with a history of progressively worsening left hip pain. Her symptoms have progressed despite medications, activity modification, steroid injections, etc. Her history and examination are consistent with progressive degenerative joint disease of the left hip, confirmed by plain radiographs. The patient presents at this time for a left total hip arthroplasty.   Procedure:   The patient was brought into the operating room. After adequate spinal anesthesia was obtained, the patient was repositioned in the right lateral decubitus position and secured using a lateral hip positioner. The left hip and lower extremity were prepped with ChloroPrep solution before being draped sterilely. Preoperative antibiotics were administered. A timeout was performed to verify the appropriate surgical site before a standard posterior approach to the hip was made through an approximately 7-8 inch incision. The incision was carried down through the subcutaneous tissues to expose the gluteal fascia and proximal end of the iliotibial band. These structures were split the length of the incision and the Charnley self-retaining hip retractor placed. The bursal tissues were swept posteriorly to expose the short external rotators. The anterior border of the  piriformis tendon was identified and this plane developed down through the capsule to enter the joint. A flap of tissue was elevated off the posterior aspect of the femoral neck and greater trochanter and retracted posteriorly. This flap included the piriformis tendon, the short external rotators, and the posterior capsule. The soft tissues were elevated off the lateral aspect of the ilium and a large Steinmann pin placed bicortically. With the left leg aligned over the right, a drill bit was placed into the greater trochanter parallel to the Steinmann pin and the distance between these two pins measured in order to optimize leg lengths postoperatively. The drill bit was removed and the hip dislocated. The piriformis fossa was debrided of soft tissues before the intramedullary canal was accessed through this point using a triple step reamer. The canal was reamed sequentially beginning with a #7 tapered reamer and progressing to a #11 tapered reamer. This provided excellent circumferential chatter. Using the appropriate guide, a femoral neck cut was made 10-12 mm above the lesser trochanter. The femoral head was removed.  Attention was directed to the acetabular side. The labrum was debrided circumferentially before the ligamentum teres was removed using a large curette. A line was drawn on the drapes corresponding to the native version of the acetabulum. This line was used as a guide while the acetabulum was reamed sequentially beginning with a 43 mm reamer and progressing to a 49 mm reamer. This provided excellent circumferential chatter. The 49 mm trial acetabulum was positioned and found to fit quite well. Therefore, the 50  mm acetabular shell was selected and impacted into place with care taken to maintain the appropriate version. The trial high wall liner was inserted.  Attention was redirected to the femoral side. A box osteotome was used to establish version before the canal was broached sequentially  beginning with a #9 broach and progressing to a #11 broach. This was left in place and several trial reductions performed using both a standard and laterally offset neck options, as well as the -6 mm, -3 mm, and +0 mm neck lengths. The permanent E-polyethylene hi-wall liner was impacted into the acetabular shell and its locking mechanism verified using a quarter-inch osteotome. Next, the #11 lateral offset femoral stem was impacted into place with care taken to maintain the appropriate version. A repeat trial reduction was performed using the -3 mm and +0 mm neck lengths. The -3 mm neck length demonstrated excellent stability both in extension and external rotation as well as with flexion to 90 and internal rotation beyond 70. It also was stable in the position of sleep. In addition, leg lengths appeared to be restored appropriately, both by reassessing the position of the right leg over the left, as well as by measuring the distance between the Steinmann pin and the drill bit. The 36 mm ceramic head with the -3 mm neck adapter was impacted onto the stem of the femoral component. The Morse taper locking mechanism was verified using manual distraction before the head was relocated and placed through a range of motion with the findings as described above.  The wound was copiously irrigated with sterile saline solution via the jet lavage system before the peri-incisional and pericapsular tissues were injected with 30 cc of 0.5% Sensorcaine with epinephrine and 20 cc of Exparel diluted out to 60 cc with normal saline to help with postoperative analgesia. The posterior flap was reapproximated to the posterior aspect of the greater trochanter using #2 Tycron interrupted sutures placed through drill holes. Several additional #2 Tycron interrupted sutures were used to reinforce this layer of closure. The iliotibial band was reapproximated using #1 Vicryl interrupted sutures before the gluteal fascia was closed using a  running #1 Vicryl suture. At this point, 1 g of transexemic acid in 10 cc of normal saline was injected into the joint to help reduce postoperative bleeding. The subcutaneous tissues were closed in several layers using 2-0 Vicryl interrupted sutures before the skin was closed using staples. A sterile occlusive dressing was applied to the wound before the patient was placed into an abduction wedge pillow. The patient was then rolled back into the supine position on her hospital bed before being awakened and returned to the recovery room in satisfactory condition after tolerating the procedure well.

## 2019-07-23 NOTE — Anesthesia Procedure Notes (Signed)
Spinal  Patient location during procedure: OR Start time: 07/23/2019 7:44 AM End time: 07/23/2019 7:47 AM Staffing Performed: resident/CRNA  Anesthesiologist: Piscitello, Precious Haws, MD Resident/CRNA: Allean Found, CRNA Preanesthetic Checklist Completed: patient identified, IV checked, site marked, risks and benefits discussed, surgical consent, monitors and equipment checked, pre-op evaluation and timeout performed Spinal Block Patient position: sitting Prep: ChloraPrep Patient monitoring: heart rate, continuous pulse ox and blood pressure Approach: midline Location: L4-5 Injection technique: single-shot Needle Needle type: Introducer and Pencan  Needle gauge: 24 G Needle length: 9 cm Needle insertion depth: 8 cm Assessment Sensory level: T6 Additional Notes Negative paresthesia. Negative blood return. Positive free-flowing CSF. Expiration date of kit checked and confirmed. Patient tolerated procedure well, without complications.

## 2019-07-24 LAB — BASIC METABOLIC PANEL
Anion gap: 9 (ref 5–15)
BUN: 16 mg/dL (ref 6–20)
CO2: 26 mmol/L (ref 22–32)
Calcium: 8.2 mg/dL — ABNORMAL LOW (ref 8.9–10.3)
Chloride: 101 mmol/L (ref 98–111)
Creatinine, Ser: 0.91 mg/dL (ref 0.44–1.00)
GFR calc Af Amer: >60 mL/min (ref >60)
GFR calc non Af Amer: >60 mL/min (ref >60)
Glucose, Bld: 141 mg/dL — ABNORMAL HIGH (ref 70–99)
Potassium: 3.6 mmol/L (ref 3.5–5.1)
Sodium: 136 mmol/L (ref 135–145)

## 2019-07-24 LAB — CBC
HCT: 36.6 % (ref 36.0–46.0)
Hemoglobin: 11.9 g/dL — ABNORMAL LOW (ref 12.0–15.0)
MCH: 31.4 pg (ref 26.0–34.0)
MCHC: 32.5 g/dL (ref 30.0–36.0)
MCV: 96.6 fL (ref 80.0–100.0)
Platelets: 167 10*3/uL (ref 150–400)
RBC: 3.79 MIL/uL — ABNORMAL LOW (ref 3.87–5.11)
RDW: 11.9 % (ref 11.5–15.5)
WBC: 12 10*3/uL — ABNORMAL HIGH (ref 4.0–10.5)
nRBC: 0 % (ref 0.0–0.2)

## 2019-07-24 MED ORDER — OXYCODONE HCL 5 MG PO TABS: 5.0000 mg | ORAL_TABLET | ORAL | 0 refills | Status: DC | PRN

## 2019-07-24 MED ORDER — ENOXAPARIN SODIUM 40 MG/0.4ML ~~LOC~~ SOLN: 40.0000 mg | SUBCUTANEOUS | 0 refills | Status: DC

## 2019-07-24 MED ORDER — TRAMADOL HCL 50 MG PO TABS: 50.0000 mg | ORAL_TABLET | Freq: Four times a day (QID) | ORAL | 0 refills | Status: DC

## 2019-07-24 NOTE — Progress Notes (Signed)
Physical Therapy Treatment Patient Details Name: Susan [name] MRN: [id number] DOB: 09/29/1960 Today's Date: 07/24/2019    History of Present Illness 59 y/o female s/p L total hip replacement (posterior approach) on 3/16.    PT Comments    Pt pleasant and motivated to participate during the session.  Pt made good progress towards goals this session.  Pt required no physical assistance with any functional task and was able to ambulate 150' with good stability and with no c/o of increased L hip pain.  Pt recalled 1/3 posterior hip precautions with education provided and with pt showing fair carryover during the session.  Pt reported 5/10 L hip pain at onset of session with no increased pain during functional tasks.  Pt's SpO2 on room air was in the low 90s at rest and mid 90s with activity with HR WNL.  Of note, pt stated that she has an elevated toilet at home with option to add grab bar, OT notified and will follow up regarding recommendations.  Will perform stair training in PM session prior to discharge.  Pt will benefit from HHPT services upon discharge to safely address deficits listed in patient problem list for decreased caregiver assistance and eventual return to PLOF.       Follow Up Recommendations  Home health PT;Follow surgeon's recommendation for DC plan and follow-up therapies     Equipment Recommendations  None recommended by PT    Recommendations for Other Services       Precautions / Restrictions Precautions Precautions: Posterior Hip Restrictions Weight Bearing Restrictions: Yes LLE Weight Bearing: Weight bearing as tolerated    Mobility  Bed Mobility Overal bed mobility: Modified Independent             General bed mobility comments: Minimal extra time and effort during sup to sit  Transfers Overall transfer level: Needs assistance Equipment used: Rolling walker (2 wheeled) Transfers: Sit to/from Stand Sit to Stand: Supervision         General  transfer comment: Mod verbal cues for sequencing for hip precaution compliance  Ambulation/Gait Ambulation/Gait assistance: Min guard Gait Distance (Feet): 150 Feet Assistive device: Rolling walker (2 wheeled) Gait Pattern/deviations: Step-through pattern;Antalgic;Decreased step length - right;Decreased step length - left;Decreased stance time - left Gait velocity: decreased   General Gait Details: Mildly antalgic gait pattern on the LLE but with reciprocal gait pattern and fair gait speed   Stairs             Wheelchair Mobility    Modified Rankin (Stroke Patients Only)       Balance Overall balance assessment: No apparent balance deficits (not formally assessed)                                          Cognition Arousal/Alertness: Awake/alert Behavior During Therapy: WFL for tasks assessed/performed Overall Cognitive Status: Within Functional Limits for tasks assessed                                        Exercises Total Joint Exercises Ankle Circles/Pumps: AROM;10 reps Quad Sets: Strengthening;Both;10 reps Gluteal Sets: Strengthening;Both;10 reps Hip ABduction/ADduction: Strengthening;Both;10 reps Long Arc Quad: Strengthening;Both;10 reps Knee Flexion: Strengthening;Both;10 reps Marching in Standing: AROM;Both;10 reps;Standing Other Exercises Other Exercises: Sit to/from stand transfer training from various height surfaces  Other Exercises: HEP education and review of handout Other Exercises: Posterior hip precaution education and review verbally and during functional tasks Other Exercises: 90 deg L turn training to prevent CKC IR of the L hip Other Exercises: Car transfer sequencing education with verbal education and visual demonstration using room chair for simulation    General Comments        Pertinent Vitals/Pain Pain Assessment: 0-10 Pain Score: 5  Pain Location: L hip Pain Descriptors / Indicators: Sore Pain  Intervention(s): Premedicated before session;Monitored during session    Home Living                      Prior Function            PT Goals (current goals can now be found in the care plan section) Progress towards PT goals: Progressing toward goals    Frequency    BID      PT Plan Current plan remains appropriate    Co-evaluation              AM-PAC PT "6 Clicks" Mobility   Outcome Measure  Help needed turning from your back to your side while in a flat bed without using bedrails?: A Little Help needed moving from lying on your back to sitting on the side of a flat bed without using bedrails?: A Little Help needed moving to and from a bed to a chair (including a wheelchair)?: A Little Help needed standing up from a chair using your arms (e.g., wheelchair or bedside chair)?: A Little Help needed to walk in hospital room?: A Little Help needed climbing 3-5 steps with a railing? : A Little 6 Click Score: 18    End of Session Equipment Utilized During Treatment: Gait belt Activity Tolerance: Patient tolerated treatment well Patient left: in bed;Other (comment)(OT entered room at end of session for OT eval) Nurse Communication: Mobility status;Precautions PT Visit Diagnosis: Muscle weakness (generalized) (M62.81);Difficulty in walking, not elsewhere classified (R26.2);Pain Pain - Right/Left: Left Pain - part of body: Hip     Time: [id number] PT Time Calculation (min) (ACUTE ONLY): 38 min  Charges:  $Gait Training: 8-22 mins $Therapeutic Exercise: 8-22 mins $Therapeutic Activity: 8-22 mins                     D. Scott Javares Kaufhold PT, DPT 07/24/19, 10:42 AM

## 2019-07-24 NOTE — Discharge Summary (Signed)
Physician Discharge Summary  Patient ID: Susan [name] MRN: [id number] DOB/AGE: 59-14-1962 19 y.o.  Admit date: 07/23/2019 Discharge date: 07/24/2019  Admission Diagnoses:  Status post total hip replacement, left [Z96.642]  Discharge Diagnoses: Patient Active Problem List   Diagnosis Date Noted  . Status post total hip replacement, left 07/23/2019  . Insulin resistance 02/18/2019  . Prediabetes 12/31/2018  . Depression 10/23/2018  . Class 2 severe obesity with serious comorbidity and body mass index (BMI) of 39.0 to 39.9 in adult ([place]) 10/23/2018  . Allergic rhinitis 08/23/2018  . Aortic atherosclerosis ([place]) 02/20/2018  . Centrilobular emphysema ([place]) 02/20/2018  . Dyslipidemia 03/28/2017  . Cocaine use disorder ([place]) 02/27/2017  . Degenerative disc disease at L5-S1 level 09/28/2016  . GERD (gastroesophageal reflux disease) 12/17/2015  . Chronic back pain 10/09/2015  . Post-traumatic osteoarthritis of right knee 09/24/2015  . Class 2 severe obesity with serious comorbidity and body mass index (BMI) of 38.0 to 38.9 in adult ([place]) 08/06/2015  . Alcoholism in recovery ([place]) 04/13/2015  . Vitamin D deficiency 04/09/2015  . Vitamin B12 deficiency 04/09/2015  . OSA on CPAP 04/09/2015  . Subclinical hyperthyroidism 06/26/2014  . Toxic multinodular goiter 06/26/2014  . Hypertension 06/26/2014  . Bipolar disorder ([place]) 06/26/2014  . Lumbar radiculitis 10/03/2013  . Diverticulosis 08/24/2013    Past Medical History:  Diagnosis Date  . ADD (attention deficit disorder)   . Alcohol abuse   . Anemia   . Anxiety   . Aortic atherosclerosis ([place]) 02/20/2018   Chest CT Sept 2019  . Arthritis    rheumatoid arthritis  . Asthma   . Bipolar disorder ([place])   . Centrilobular emphysema ([place]) 02/20/2018   Chest CT Sept 2019  . Constipation   . Degenerative disc disease at L5-S1 level 09/28/2016   See ortho note May 2018  . Depression    bipolar, hx of suicide attempt  . Drug use    . Dyspnea    with exertion  . GERD (gastroesophageal reflux disease)   . H/O suicide attempt    slit wrists  . Hepatitis C 06/26/2014  . Hip pain   . History of alcohol abuse   . History of diverticulitis 2013  . History of hepatitis C    HEP "C"--three years ago  . History of MRSA infection 2013  . HLD (hyperlipidemia)   . Hypertension   . Hypothyroidism   . Incisional hernia 11/08/2012  . Knee pain   . 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 Multinodular thyroid   . OSA (obstructive sleep apnea)    managed by Dr. Manuella Ghazi  . Osteoporosis   . Post-traumatic osteoarthritis of right knee 09/24/2015  . Prediabetes   . Recurrent ventral hernia 11/08/2012  . Sleep apnea     Dx 3 years ago. Use C-PAP  . 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 Thyroid disease    Goiter  . Vitamin B12 deficiency   . Vitamin D deficiency disease      Transfusion: None.   Consultants (if any):   Discharged Condition: Improved  Hospital Course: Susan [name] is an 59 y.o. female who was admitted 07/23/2019 with a diagnosis of primary osteoarthritis of the left hip and went to the operating room on 07/23/2019 and underwent the above named procedures.    Surgeries: Procedure(s): TOTAL HIP ARTHROPLASTY on 07/23/2019 Patient tolerated the surgery well. Taken to PACU where she was stabilized and then transferred to the orthopedic floor.  Started on Lovenox 40mg  q 24 hrs. Foot pumps applied bilaterally at 80 mm. Heels elevated on bed with rolled towels. No evidence of DVT. Negative [place]. Physical therapy started on day #1 for gait training and transfer. OT started day #1 for ADL and assisted devices.  Patient's IV was removed on POD1.  Implants: Biomet press-fit system with a #11 laterally offset Echo femoral stem, a 50 mm acetabular shell with an E-poly hi-wall liner, and a 36 mm ceramic head with a -3 mm neck.  She was given perioperative antibiotics:  Anti-infectives (From admission,  onward)   Start     Dose/Rate Route Frequency Ordered Stop   07/23/19 1600  ceFAZolin (ANCEF) IVPB 2g/100 mL premix     2 g 200 mL/hr over 30 Minutes Intravenous Every 8 hours 07/23/19 1405 07/24/19 0539   07/23/19 1400  ceFAZolin (ANCEF) IVPB 2g/100 mL premix  Status:  Discontinued     2 g 200 mL/hr over 30 Minutes Intravenous Every 8 hours 07/23/19 1313 07/23/19 1405   07/23/19 1351  ceFAZolin (ANCEF) 2-4 GM/100ML-% IVPB    Note to Pharmacy: Nanine Means   : cabinet override      07/23/19 1351 07/24/19 0159   07/23/19 1200  ceFAZolin (ANCEF) IVPB 2g/100 mL premix  Status:  Discontinued     2 g 200 mL/hr over 30 Minutes Intravenous Every 6 hours 07/23/19 1153 07/23/19 1313   07/23/19 0621  ceFAZolin (ANCEF) 2-4 GM/100ML-% IVPB    Note to Pharmacy: Trudie Reed   : cabinet override      07/23/19 0621 07/23/19 1616   07/23/19 0615  ceFAZolin (ANCEF) IVPB 2g/100 mL premix     2 g 200 mL/hr over 30 Minutes Intravenous On call to O.R. 07/23/19 [id number] 07/23/19 0810    .  She was given sequential compression devices, early ambulation, and Lovenox for DVT prophylaxis.  She benefited maximally from the hospital stay and there were no complications.    Recent vital signs:  Vitals:   07/23/19 2357 07/24/19 0412  BP: 138/75 129/78  Pulse: 100 100  Resp: 16 16  Temp: 98.2 F (36.8 C) 98.3 F (36.8 C)  SpO2: 95% 94%    Recent laboratory studies:  Lab Results  Component Value Date   HGB 11.9 (L) 07/24/2019   HGB 14.8 07/17/2019   HGB 13.4 04/08/2019   Lab Results  Component Value Date   WBC 12.0 (H) 07/24/2019   PLT 167 07/24/2019   Lab Results  Component Value Date   INR 0.9 07/17/2019   Lab Results  Component Value Date   NA 136 07/24/2019   K 3.6 07/24/2019   CL 101 07/24/2019   CO2 26 07/24/2019   BUN 16 07/24/2019   CREATININE 0.91 07/24/2019   GLUCOSE 141 (H) 07/24/2019    Discharge Medications:   Allergies as of 07/24/2019      Reactions    Hydrochlorothiazide Other (See Comments)   Electrolyte imbalance   Lasix [furosemide] Other (See Comments)   Electrolyte imbalance      Medication List    STOP taking these medications   HYDROcodone-acetaminophen 5-325 MG tablet Commonly known as: NORCO/VICODIN   meloxicam 15 MG tablet Commonly known as: MOBIC     TAKE these medications   albuterol 108 (90 Base) MCG/ACT inhaler Commonly known as: VENTOLIN HFA INHALE 1 TO 2 PUFFS EVERY 4 HOURS AS NEEDED FOR COUGH, WHEEZING, AND SHORTNESS OF BREATH What changed:   how much to take  how to take this  when to take this  reasons to take this  additional instructions   amLODipine 5 MG tablet Commonly known as: NORVASC TAKE 1 TABLET EVERY DAY   Anoro Ellipta 62.5-25 MCG/INH Aepb Generic drug: umeclidinium-vilanterol Inhale 1 puff into the lungs daily.   atenolol 25 MG tablet Commonly known as: TENORMIN TAKE 1 TABLET EVERY DAY   atorvastatin 10 MG tablet Commonly known as: LIPITOR TAKE 1 TABLET AT BEDTIME   clotrimazole 1 % cream Commonly known as: LOTRIMIN Apply 1 application topically 2 (two) times daily.   cyclobenzaprine 5 MG tablet Commonly known as: FLEXERIL Take 5 mg by mouth 2 (two) times daily as needed for muscle spasms.   DULoxetine 60 MG capsule Commonly known as: CYMBALTA Take 1 capsule (60 mg total) by mouth 2 (two) times daily.   enoxaparin 40 MG/0.4ML injection Commonly known as: LOVENOX Inject 0.4 mLs (40 mg total) into the skin daily.   gabapentin 300 MG capsule Commonly known as: NEURONTIN Take 600 mg by mouth 2 (two) times daily.   loratadine 10 MG tablet Commonly known as: CLARITIN Take 1 tablet (10 mg total) by mouth daily as needed for allergies. What changed: when to take this   losartan 100 MG tablet Commonly known as: COZAAR TAKE 1 TABLET EVERY DAY   omeprazole 40 MG capsule Commonly known as: PRILOSEC TAKE 1 CAPSULE (40 MG TOTAL) BY MOUTH DAILY.   oxyCODONE 5 MG  immediate release tablet Commonly known as: Oxy IR/ROXICODONE Take 1-2 tablets (5-10 mg total) by mouth every 4 (four) hours as needed for moderate pain (pain score 4-6).   traMADol 50 MG tablet Commonly known as: ULTRAM Take 1 tablet (50 mg total) by mouth every 6 (six) hours.   vitamin B-12 1000 MCG tablet Commonly known as: CYANOCOBALAMIN Take 1,000 mcg by mouth daily.   Vitamin D (Ergocalciferol) 1.25 MG (50000 UNIT) Caps capsule Commonly known as: DRISDOL Take 1 capsule (50,000 Units total) by mouth every 3 (three) days.            Durable Medical Equipment  (From admission, onward)         Start     Ordered   07/23/19 1623  DME Bedside commode  Once    Question:  Patient needs a bedside commode to treat with the following condition  Answer:  Status post total hip replacement, left   07/23/19 1622   07/23/19 1623  DME 3 n 1  Once     07/23/19 1622   07/23/19 1623  DME Walker rolling  Once    Question Answer Comment  Walker: With 5 Inch Wheels   Patient needs a walker to treat with the following condition Status post total hip replacement, left      07/23/19 1622          Diagnostic Studies: DG HIP UNILAT W OR W/O PELVIS 2-3 VIEWS LEFT  Result Date: 07/23/2019 CLINICAL DATA:  Post LEFT hip replacement surgery EXAM: DG HIP (WITH OR WITHOUT PELVIS) 2-3V LEFT COMPARISON:  01/29/2013 FINDINGS: Interval placement of a LEFT hip prosthesis. Bones demineralized. No fracture or dislocation. Progressive degenerative changes at pubic symphysis. IMPRESSION: LEFT hip prosthesis without acute complication Electronically Signed   By: Lavonia Dana M.D.   On: 07/23/2019 10:26   Disposition: Plan for d/c home with HHPT this afternoon pending progress with PT.  Follow-up Information    Lattie Corns, PA-C Follow up in 14 day(s).   Specialty: Physician  Assistant Why: Lajean Saver Contact information: [place]  [id number] [phone]          Signed: Judson Roch PA-C 07/24/2019, 8:06 AM

## 2019-07-24 NOTE — Progress Notes (Signed)
OT Cancellation Note  Patient Details Name: Susan [name] MRN: [id number] DOB: 12-Mar-1961   Cancelled Treatment:    Reason Eval/Treat Not Completed: Patient at procedure or test/ unavailable(Pt. with PT. Will continue to monitor, and perform initial eval at a later time, or date.)  Harrel Carina, MS, OTR/L 07/24/2019, 9:57 AM

## 2019-07-24 NOTE — Discharge Instructions (Signed)
Instructions after Total Hip Replacement     J. Jeffrey Poggi, M.D.  J. Lance [place], PA-C     Dept. of Orthopaedics & Sports Medicine  [facility]  [address]  [place], [place]  [address]  Phone: [phone]   Fax: [phone]    DIET: . Drink plenty of non-alcoholic fluids. . Resume your normal diet. Include foods high in fiber.  ACTIVITY:  . You may use crutches or a walker with weight-bearing as tolerated, unless instructed otherwise. . You may be weaned off of the walker or crutches by your Physical Therapist.  . Do NOT reach below the level of your knees or cross your legs until allowed.    . Continue doing gentle exercises. Exercising will reduce the pain and swelling, increase motion, and prevent muscle weakness.   . Please continue to use the TED compression stockings for 6 weeks. You may remove the stockings at night, but should reapply them in the morning. . Do not drive or operate any equipment until instructed.  WOUND CARE:  . Continue to use ice packs periodically to reduce pain and swelling. . Keep the incision clean and dry. . You may bathe or shower after the staples are removed at the first office visit following surgery.  MEDICATIONS: . You may resume your regular medications. . Please take the pain medication as prescribed on the medication. . Do not take pain medication on an empty stomach. . You have been given a prescription for a blood thinner to prevent blood clots. Please take the medication as instructed. (NOTE: After completing a 2 week course of Lovenox, take one Enteric-coated aspirin once a day.) . Pain medications and iron supplements can cause constipation. Use a stool softener (Senokot or Colace) on a daily basis and a laxative (dulcolax or miralax) as needed. . Do not drive or drink alcoholic beverages when taking pain medications.  CALL THE OFFICE FOR: . Temperature above 101 degrees . Excessive bleeding or drainage on the  dressing. . Excessive swelling, coldness, or paleness of the toes. . Persistent nausea and vomiting.  FOLLOW-UP:  . You should have an appointment to return to the office in 2 weeks after surgery. . Arrangements have been made for continuation of Physical Therapy (either home therapy or outpatient therapy).  

## 2019-07-24 NOTE — Evaluation (Signed)
Occupational Therapy Evaluation Patient Details Name: Susan TERNES MRN: [id number] DOB: 01/08/1961 Today's Date: 07/24/2019    History of Present Illness Pt. is a 59 y/o female who was admitted to [facility] for a L total hip replacement (posterior Approach)   Clinical Impression   Pt. presents with 7/10 pain, weakness, posterior hip precautions, and limited functional mobility which limits his ability to complete basic ADL and IADL functioning. Pt. resides at home with her son. Pt. was independent with ADLs, and IADL functioning: including meal preparation, medication management, and driving. Pt. reports currently being on disability. Pt. reports that her son passed away this past 2022/08/18. Pt. education was provided about A/E use for LE ADLs, toilet aides, and posterior hip precautions. Pt. Could benefit from OT services for ADL training, A/E training, and pt. Education about posterior hip precautions, home modification, and DME. Pt. Plans to return home upon discharge, with family assistance as needed.    Follow Up Recommendations  No OT follow up    Equipment Recommendations  3 in 1 bedside commode    Recommendations for Other Services       Precautions / Restrictions Precautions Precautions: Posterior Hip Restrictions Weight Bearing Restrictions: Yes LLE Weight Bearing: Weight bearing as tolerated      Mobility Bed Mobility Overal bed mobility: Needs Assistance Bed Mobility: Sit to Supine     Supine to sit: Supervision       Transfers Overall transfer level: Needs assistance Equipment used: Rolling walker (2 wheeled) Transfers: Sit to/from Stand Sit to Stand: Supervision            Balance                                         ADL either performed or assessed with clinical judgement   ADL Overall ADL's : Needs assistance/impaired Eating/Feeding: Set up;Independent   Grooming: Min guard   Upper Body Bathing: Set up;Independent    Lower Body Bathing: Set up;Minimal assistance   Upper Body Dressing : Set up;Independent   Lower Body Dressing: Set up;Minimal assistance                 General ADL Comments: Pt. education was provided about A/E use for LE ADLs, toilet aides, and posterior hip precautions.     Vision Baseline Vision/History: No visual deficits Patient Visual Report: No change from baseline       Perception     Praxis      Pertinent Vitals/Pain Pain Assessment: 0-10 Pain Score: 7  Pain Location: L hip Pain Descriptors / Indicators: Aching Pain Intervention(s): Limited activity within patient's tolerance;Repositioned;Monitored during session     Hand Dominance Right   Extremity/Trunk Assessment Upper Extremity Assessment Upper Extremity Assessment: Overall WFL for tasks assessed           Communication Communication Communication: No difficulties   Cognition Arousal/Alertness: Awake/alert Behavior During Therapy: WFL for tasks assessed/performed Overall Cognitive Status: Within Functional Limits for tasks assessed                                     General Comments       Exercises   Shoulder Instructions      Home Living Family/patient expects to be discharged to:: Private residence Living Arrangements: Children Available Help at Discharge: Family;Available  24 hours/day Type of Home: House Home Access: Stairs to enter [employer] of Steps: 3 Entrance Stairs-Rails: Can reach both Home Layout: Able to live on main level with bedroom/bathroom         Bathroom Toilet: Handicapped height Bathroom Accessibility: Yes   Home Equipment: Shower seat;Grab bars - tub/shower;Walker - 2 wheels;Walker - 4 wheels(Reacher, sockaid, LH sponge)          Prior Functioning/Environment Level of Independence: Independent        Comments: Pt. was independent with ADLs, IADLs.        OT Problem List: Decreased strength;Decreased activity  tolerance;Decreased knowledge of use of DME or AE;Pain      OT Treatment/Interventions: Self-care/ADL training;Therapeutic exercise;Patient/family education;DME and/or AE instruction;Therapeutic activities;Energy conservation    OT Goals(Current goals can be found in the care plan section)    OT Frequency: Min 2X/week   Barriers to D/C:            Co-evaluation              AM-PAC OT "6 Clicks" Daily Activity     Outcome Measure Help from another person eating meals?: None Help from another person taking care of personal grooming?: A Little Help from another person toileting, which includes using toliet, bedpan, or urinal?: A Little Help from another person bathing (including washing, rinsing, drying)?: A Little Help from another person to put on and taking off regular upper body clothing?: A Little Help from another person to put on and taking off regular lower body clothing?: A Little 6 Click Score: 19   End of Session    Activity Tolerance: Patient tolerated treatment well Patient left: in bed  OT Visit Diagnosis: Muscle weakness (generalized) (M62.81)                Time: [id number] OT Time Calculation (min): 23 min Charges:  OT General Charges $OT Visit: 1 Visit OT Evaluation $OT Eval Moderate Complexity: 1 Mod  Harrel Carina, MS, OTR/L  Harrel Carina 07/24/2019, 12:03 PM

## 2019-07-24 NOTE — TOC Transition Note (Signed)
Transition of Care [facility]) - CM/SW Discharge Note   Patient Details  Name: Susan [name] MRN: [id number] Date of Birth: 1960-09-25  Transition of Care [facility]) CM/SW Contact:  Su Hilt, RN Phone Number: 07/24/2019, 12:29 PM   Clinical Narrative:     Met with the patient in the room to discuss DC plan and needs, She lives alone but has family support, her son died 51 and she wants to get discharged, She has a RW, BSC, Shower seat and rolator at home and does not need additional DME, I provided her with the price of Lovenox at $1.30 and let her know [facility] has accepted [facility] as a [facility] for [facility], she has transportation by family and friends, she is up to date with PCP  Final next level of care: Home w Home Health Services Barriers to Discharge: Barriers Resolved   Patient Goals and CMS Choice Patient states their goals for this hospitalization and ongoing recovery are:: go home CMS Medicare.gov Compare Post Acute Care list provided to:: Patient Choice offered to / list presented to : Patient  Discharge Placement                       Discharge Plan and Services   Discharge Planning Services: CM Consult            DME Arranged: N/A         HH Arranged: PT HH Agency: [place] ([facility]) Date [place]: 07/24/19 Time [place]: 1229 Representative spoke with at [place]: [place] ([place]) Interventions     Readmission Risk Interventions No flowsheet data found.

## 2019-07-24 NOTE — TOC Progression Note (Signed)
Transition of Care [facility]) - Progression Note    Patient Details  Name: Susan [name] MRN: [id number] Date of Birth: October 07, 1960  Transition of Care [facility]) CM/SW [place], RN Phone Number: 07/24/2019, 12:20 PM  Clinical Narrative:     [facility], I notified the patient       Expected Discharge Plan and Services                                                 Social Determinants of Health (SDOH) Interventions    Readmission Risk Interventions No flowsheet data found.

## 2019-07-24 NOTE — TOC Benefit Eligibility Note (Signed)
Transition of Care [facility]) Benefit Eligibility Note    Patient Details  Name: TAWNNI BESTER MRN: [id number] Date of Birth: [birth year]   Medication/Dose: Enoxaparin 40mg  once daily for 14 days  Covered?: Yes  Prescription Coverage Preferred Pharmacy: [facility] with Person/Company/Phone Number:: Bronson Curb with [employer] at [phone]  Co-Pay: $1.30 estimated copay  Prior Approval: No  Deductible: (No deductible  LIS Assistance plan.  Level 2)    Dannette Barbara Phone Number: [phone] or [phone] 07/24/2019, 9:39 AM

## 2019-07-24 NOTE — Anesthesia Postprocedure Evaluation (Signed)
Anesthesia Post Note  Patient: Susan [name]  Procedure(s) Performed: TOTAL HIP ARTHROPLASTY (Left Hip)  Patient location during evaluation: Nursing Unit Anesthesia Type: Spinal Level of consciousness: awake, awake and alert and oriented Pain management: pain level controlled Vital Signs Assessment: post-procedure vital signs reviewed and stable Respiratory status: spontaneous breathing, nonlabored ventilation and respiratory function stable Cardiovascular status: blood pressure returned to baseline and stable Postop Assessment: no headache and no backache     Last Vitals:  Vitals:   07/23/19 2357 07/24/19 0412  BP: 138/75 129/78  Pulse: 100 100  Resp: 16 16  Temp: 36.8 C 36.8 C  SpO2: 95% 94%    Last Pain:  Vitals:   07/24/19 0500  TempSrc:   PainSc: 0-No pain                 [name]

## 2019-07-24 NOTE — Progress Notes (Signed)
Physical Therapy Treatment Patient Details Name: Susan [name] MRN: [id number] DOB: 1961/01/29 Today's Date: 07/24/2019    History of Present Illness Pt. is a 59 y/o female who was admitted to [facility] for a L total hip replacement (posterior Approach)    PT Comments    Pt pleasant and motivated to participate during the session.  Pt performed very well during the session with fair to good carryover regarding posterior hip precaution compliance with functional tasks.  Pt was able to amb 2 x 150' this session with SpO2 and HR WNL and ascended/descended 4 steps with good control and stability.  Pt reported no increase in L hip pain during the session and stated that activity "makes it feel better".  Pt will benefit from HHPT services upon discharge to safely address deficits listed in patient problem list for decreased caregiver assistance and eventual return to PLOF.      Follow Up Recommendations  Home health PT;Follow surgeon's recommendation for DC plan and follow-up therapies     Equipment Recommendations  None recommended by PT    Recommendations for Other Services       Precautions / Restrictions Precautions Precautions: Posterior Hip Restrictions Weight Bearing Restrictions: Yes LLE Weight Bearing: Weight bearing as tolerated    Mobility  Bed Mobility Overal bed mobility: Modified Independent Bed Mobility: Supine to Sit     Supine to sit: Modified independent (Device/Increase time)     General bed mobility comments: Minimal extra time and effort during sup to sit  Transfers Overall transfer level: Needs assistance Equipment used: Rolling walker (2 wheeled) Transfers: Sit to/from Stand Sit to Stand: Supervision         General transfer comment: Min verbal cues for sequencing for hip precaution compliance  Ambulation/Gait Ambulation/Gait assistance: Supervision Gait Distance (Feet): 150 Feet x 2 Assistive device: Rolling walker (2 wheeled) Gait  Pattern/deviations: Step-through pattern;Antalgic;Decreased step length - right;Decreased step length - left;Decreased stance time - left Gait velocity: decreased   General Gait Details: Mildly antalgic gait pattern on the LLE but with reciprocal gait pattern and fair gait speed with grossly decreased UE WB on the RW this session   Stairs Stairs: Yes Stairs assistance: Min guard Stair Management: Two rails;Forwards Number of Stairs: 4 General stair comments: Verbal sequencing education and visual demonstration followed by pt practice with good carryover of proper sequencing and good control/stability   Wheelchair Mobility    Modified Rankin (Stroke Patients Only)       Balance Overall balance assessment: No apparent balance deficits (not formally assessed)                                          Cognition Arousal/Alertness: Awake/alert Behavior During Therapy: WFL for tasks assessed/performed Overall Cognitive Status: Within Functional Limits for tasks assessed                                        Exercises Total Joint Exercises Ankle Circles/Pumps: AROM;10 reps Quad Sets: Strengthening;Both;10 reps Gluteal Sets: Strengthening;Both;10 reps Hip ABduction/ADduction: Strengthening;Both;10 reps Long Arc Quad: Strengthening;Both;10 reps Knee Flexion: Strengthening;Both;10 reps Marching in Standing: AROM;Both;10 reps;Standing Other Exercises Other Exercises: Sit to/from stand transfer training from various height surfaces Other Exercises: 90 deg L turn training to prevent CKC IR of the L hip Other  Exercises: Posterior hip precaution education and review verbally and during functional tasks Other Exercises: 90 deg L turn training to prevent CKC IR of the L hip Other Exercises: Car transfer sequencing education with verbal education and visual demonstration using room chair for simulation    General Comments        Pertinent  Vitals/Pain Pain Assessment: 0-10 Pain Score: 4  Pain Location: L hip Pain Descriptors / Indicators: Aching Pain Intervention(s): Premedicated before session;Monitored during session    Home Living Family/patient expects to be discharged to:: Private residence Living Arrangements: Children Available Help at Discharge: Family;Available 24 hours/day Type of Home: House Home Access: Stairs to enter Entrance Stairs-Rails: Can reach both Home Layout: Able to live on main level with bedroom/bathroom Home Equipment: Shower seat;Grab bars - tub/shower;Walker - 2 wheels;Walker - 4 wheels(Reacher, sockaid, LH sponge)      Prior Function Level of Independence: Independent      Comments: Pt. was independent with ADLs, IADLs.   PT Goals (current goals can now be found in the care plan section) Progress towards PT goals: Progressing toward goals    Frequency    BID      PT Plan Current plan remains appropriate    Co-evaluation              AM-PAC PT "6 Clicks" Mobility   Outcome Measure  Help needed turning from your back to your side while in a flat bed without using bedrails?: None Help needed moving from lying on your back to sitting on the side of a flat bed without using bedrails?: None Help needed moving to and from a bed to a chair (including a wheelchair)?: A Little Help needed standing up from a chair using your arms (e.g., wheelchair or bedside chair)?: A Little Help needed to walk in hospital room?: A Little Help needed climbing 3-5 steps with a railing? : A Little 6 Click Score: 20    End of Session Equipment Utilized During Treatment: Gait belt Activity Tolerance: Patient tolerated treatment well Patient left: in chair;with chair alarm set;with SCD's reapplied;with call bell/phone within reach Nurse Communication: Mobility status PT Visit Diagnosis: Muscle weakness (generalized) (M62.81);Difficulty in walking, not elsewhere classified (R26.2);Pain Pain -  Right/Left: Left Pain - part of body: Hip     Time: 1343-1406 PT Time Calculation (min) (ACUTE ONLY): 23 min  Charges:  $Gait Training: 8-22 mins $Therapeutic Exercise: 8-22 mins $Therapeutic Activity: 8-22 mins                     D. Scott Natassja Ollis PT, DPT 07/24/19, 2:26 PM

## 2019-07-24 NOTE — Progress Notes (Signed)
Subjective: 1 Day Post-Op Procedure(s) (LRB): TOTAL HIP ARTHROPLASTY (Left) Patient reports pain as 4 on 0-10 scale.   Patient is well, and has had no acute complaints or problems Plan is to go Home after hospital stay. Negative for chest pain and shortness of breath Fever: no Gastrointestinal:Negative for nausea and vomiting  Objective: Vital signs in last 24 hours: Temp:  [97.8 F (36.6 C)-98.8 F (37.1 C)] 98.3 F (36.8 C) (03/17 0412) Pulse Rate:  [65-100] 100 (03/17 0412) Resp:  [14-24] 16 (03/17 0412) BP: (113-155)/(45-92) 129/78 (03/17 0412) SpO2:  [88 %-100 %] 94 % (03/17 0412)  Intake/Output from previous day:  Intake/Output Summary (Last 24 hours) at 07/24/2019 0801 Last data filed at 07/24/2019 0509 Gross per 24 hour  Intake 1443.61 ml  Output 1250 ml  Net 193.61 ml    Intake/Output this shift: No intake/output data recorded.  Labs: Recent Labs    07/24/19 0718  HGB 11.9*   Recent Labs    07/24/19 0718  WBC 12.0*  RBC 3.79*  HCT 36.6  PLT 167   Recent Labs    07/24/19 0718  NA 136  K 3.6  CL 101  CO2 26  BUN 16  CREATININE 0.91  GLUCOSE 141*  CALCIUM 8.2*   No results for input(s): LABPT, INR in the last 72 hours.   EXAM General - Patient is Alert, Appropriate and Oriented Extremity - ABD soft Sensation intact distally Intact pulses distally Dorsiflexion/Plantar flexion intact Incision: dressing C/D/I No cellulitis present Dressing/Incision - clean, dry, no drainage Motor Function - intact, moving foot and toes well on exam.  Abdomen soft with normal bowel sounds.  Past Medical History:  Diagnosis Date  . ADD (attention deficit disorder)   . Alcohol abuse   . Anemia   . Anxiety   . Aortic atherosclerosis ([place]) 02/20/2018   Chest CT Sept 2019  . Arthritis    rheumatoid arthritis  . Asthma   . Bipolar disorder ([place])   . Centrilobular emphysema ([place]) 02/20/2018   Chest CT Sept 2019  . Constipation   . Degenerative disc  disease at L5-S1 level 09/28/2016   See ortho note May 2018  . Depression    bipolar, hx of suicide attempt  . Drug use   . Dyspnea    with exertion  . GERD (gastroesophageal reflux disease)   . H/O suicide attempt    slit wrists  . Hepatitis C 06/26/2014  . Hip pain   . History of alcohol abuse   . History of diverticulitis 2013  . History of hepatitis C    HEP "C"--three years ago  . History of MRSA infection 2013  . HLD (hyperlipidemia)   . Hypertension   . Hypothyroidism   . Incisional hernia 11/08/2012  . Knee pain   . 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 Multinodular thyroid   . OSA (obstructive sleep apnea)    managed by Dr. Manuella Ghazi  . Osteoporosis   . Post-traumatic osteoarthritis of right knee 09/24/2015  . Prediabetes   . Recurrent ventral hernia 11/08/2012  . Sleep apnea     Dx 3 years ago. Use C-PAP  . 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 Thyroid disease    Goiter  . Vitamin B12 deficiency   . Vitamin D deficiency disease    Assessment/Plan: 1 Day Post-Op Procedure(s) (LRB): TOTAL HIP ARTHROPLASTY (Left) Active Problems:   Status post total hip replacement, left  Estimated body mass index is  42.07 kg/m as calculated from the following:   Height as of 07/16/19: 5\' 2"  (1.575 m).   Weight as of 07/16/19: 104.3 kg. Advance diet Up with therapy D/C IV fluids when tolerating po intake.  Labs reviewed this AM, Hg 11.9, WBC 12.0 Patient did well with PT yesterday.  Continue with PT today. Patient is passing gas without having a BM at this time. Plan for d/c home today.  DVT Prophylaxis - Lovenox, Foot Pumps and TED hose Weight-Bearing as tolerated to left leg  J. Cameron Proud, PA-C [facility] Orthopaedic Surgery 07/24/2019, 8:01 AM

## 2019-07-24 NOTE — Progress Notes (Signed)
Spoke with PA, stated he was ok to discharge without BM. However administer bowel prep prior.

## 2019-07-24 NOTE — Progress Notes (Signed)
Pt d/c home this afternoon via family car.  D/C summary reviewed with SWOT nurse.  Pt verbalized understanding  All belongings packed and taken.  NAD noted at time of d/c.  Pt skin warm dry clean and intact, dressing to L hip clean and intact.

## 2019-07-24 NOTE — Progress Notes (Signed)
Pt with hyperactive bowel sounds to all four quadrants. PRM MOM given.  Pt states she has been passing gas.

## 2019-07-25 LAB — SURGICAL PATHOLOGY

## 2019-07-26 ENCOUNTER — Other Ambulatory Visit (INDEPENDENT_AMBULATORY_CARE_PROVIDER_SITE_OTHER): Payer: Self-pay | Admitting: Family Medicine

## 2019-07-26 ENCOUNTER — Other Ambulatory Visit: Payer: Self-pay | Admitting: Pulmonary Disease

## 2019-07-26 DIAGNOSIS — E559 Vitamin D deficiency, unspecified: Secondary | ICD-10-CM

## 2019-08-05 ENCOUNTER — Other Ambulatory Visit (INDEPENDENT_AMBULATORY_CARE_PROVIDER_SITE_OTHER): Payer: Self-pay | Admitting: Family Medicine

## 2019-08-05 DIAGNOSIS — E559 Vitamin D deficiency, unspecified: Secondary | ICD-10-CM

## 2019-08-08 ENCOUNTER — Ambulatory Visit (INDEPENDENT_AMBULATORY_CARE_PROVIDER_SITE_OTHER): Payer: Medicare HMO | Admitting: Psychology

## 2019-08-08 DIAGNOSIS — F3289 Other specified depressive episodes: Secondary | ICD-10-CM

## 2019-08-21 ENCOUNTER — Other Ambulatory Visit: Payer: Self-pay

## 2019-08-21 ENCOUNTER — Ambulatory Visit (INDEPENDENT_AMBULATORY_CARE_PROVIDER_SITE_OTHER): Payer: Medicare HMO | Admitting: Family Medicine

## 2019-08-21 ENCOUNTER — Encounter: Payer: Self-pay | Admitting: Family Medicine

## 2019-08-21 VITALS — BP 122/70 | HR 85 | Temp 97.8°F | Resp 14 | Ht 63.0 in | Wt 224.0 lb

## 2019-08-21 DIAGNOSIS — Z6839 Body mass index (BMI) 39.0-39.9, adult: Secondary | ICD-10-CM

## 2019-08-21 DIAGNOSIS — E785 Hyperlipidemia, unspecified: Secondary | ICD-10-CM | POA: Diagnosis not present

## 2019-08-21 DIAGNOSIS — I700 Atherosclerosis of aorta: Secondary | ICD-10-CM | POA: Diagnosis not present

## 2019-08-21 DIAGNOSIS — I10 Essential (primary) hypertension: Secondary | ICD-10-CM

## 2019-08-21 DIAGNOSIS — Z720 Tobacco use: Secondary | ICD-10-CM

## 2019-08-21 DIAGNOSIS — F1021 Alcohol dependence, in remission: Secondary | ICD-10-CM

## 2019-08-21 DIAGNOSIS — K219 Gastro-esophageal reflux disease without esophagitis: Secondary | ICD-10-CM

## 2019-08-21 DIAGNOSIS — L989 Disorder of the skin and subcutaneous tissue, unspecified: Secondary | ICD-10-CM

## 2019-08-21 DIAGNOSIS — J432 Centrilobular emphysema: Secondary | ICD-10-CM | POA: Diagnosis not present

## 2019-08-21 DIAGNOSIS — F4321 Adjustment disorder with depressed mood: Secondary | ICD-10-CM

## 2019-08-21 DIAGNOSIS — R21 Rash and other nonspecific skin eruption: Secondary | ICD-10-CM

## 2019-08-21 DIAGNOSIS — F3289 Other specified depressive episodes: Secondary | ICD-10-CM

## 2019-08-21 DIAGNOSIS — F3170 Bipolar disorder, currently in remission, most recent episode unspecified: Secondary | ICD-10-CM

## 2019-08-21 DIAGNOSIS — Z634 Disappearance and death of family member: Secondary | ICD-10-CM

## 2019-08-21 MED ORDER — KETOCONAZOLE 2 % EX SHAM: 1.0000 "application " | MEDICATED_SHAMPOO | CUTANEOUS | 0 refills | Status: DC

## 2019-08-21 MED ORDER — AMLODIPINE BESYLATE 5 MG PO TABS: 5.0000 mg | ORAL_TABLET | Freq: Every day | ORAL | 3 refills | Status: DC

## 2019-08-21 MED ORDER — OMEPRAZOLE 40 MG PO CPDR: 40.0000 mg | DELAYED_RELEASE_CAPSULE | Freq: Every day | ORAL | 2 refills | Status: DC

## 2019-08-21 MED ORDER — ATORVASTATIN CALCIUM 10 MG PO TABS: 10.0000 mg | ORAL_TABLET | Freq: Every day | ORAL | 3 refills | Status: DC

## 2019-08-21 MED ORDER — ANORO ELLIPTA 62.5-25 MCG/INH IN AEPB: 1.0000 | INHALATION_SPRAY | Freq: Every day | RESPIRATORY_TRACT | 5 refills | Status: DC

## 2019-08-21 MED ORDER — ALBUTEROL SULFATE HFA 108 (90 BASE) MCG/ACT IN AERS: 2.0000 | INHALATION_SPRAY | RESPIRATORY_TRACT | 2 refills | Status: DC | PRN

## 2019-08-21 MED ORDER — DULOXETINE HCL 60 MG PO CPEP: 60.0000 mg | ORAL_CAPSULE | Freq: Two times a day (BID) | ORAL | 3 refills | Status: DC

## 2019-08-21 MED ORDER — LOSARTAN POTASSIUM 100 MG PO TABS: 100.0000 mg | ORAL_TABLET | Freq: Every day | ORAL | 3 refills | Status: DC

## 2019-08-21 MED ORDER — CLOTRIMAZOLE 1 % EX CREA: 1.0000 "application " | TOPICAL_CREAM | Freq: Two times a day (BID) | CUTANEOUS | 0 refills | Status: DC

## 2019-08-21 MED ORDER — ATENOLOL 25 MG PO TABS: 25.0000 mg | ORAL_TABLET | Freq: Every day | ORAL | 3 refills | Status: DC

## 2019-08-21 NOTE — Progress Notes (Signed)
Name: Susan [name]   MRN: [id number]    DOB: 10-Apr-1961   Date:08/21/2019       Progress Note  Chief Complaint  Patient presents with  . Medication Refill    cream-for yeast rash under stomach  . Referral    Derm, raised red spots on scalp, with itching  . Hypertension  . Depression  . Gastroesophageal Reflux  . Hyperlipidemia     Subjective:   Susan [name] is a 59 y.o. female, presents to clinic for routine follow up on the conditions listed above.  Hypertension:  Currently managed on amlodipine 5 mg, atenolol 25 mg (takes at night), losartan 100 mg Pt reports good med compliance and denies any SE.  No lightheadedness, hypotension, syncope. Blood pressure today is well controlled. BP Readings from Last 3 Encounters:  08/21/19 122/70  07/24/19 122/61  04/18/19 131/75  Pt denies CP, SOB, exertional sx, LE edema, palpitation, Ha's, visual disturbances Dietary efforts for BP?  Low salt She did have some swelling in legs but it improved on its own Recent labs in March for surgery - we reviewed those labs today, pt had normal renal function and electrolytes   GERD: No change, continues to be well controlled with taking daily, cannot wean off Acid reflux, indigestion is well controlled with prilosec 40 mg daily, when she runs out she gets sx, denies N, V, bloating, abd pain, melena, hematochezia  Obesity:  She has lost weight continues to work on cooking at home, decreasing portions and cutting out junk food Wt Readings from Last 5 Encounters:  08/21/19 224 lb (101.6 kg)  07/24/19 229 lb 15 oz (104.3 kg)  07/16/19 230 lb (104.3 kg)  04/22/19 220 lb (99.8 kg)  04/18/19 220 lb (99.8 kg)   BMI Readings from Last 5 Encounters:  08/21/19 39.68 kg/m  07/24/19 42.06 kg/m  07/16/19 42.07 kg/m  04/22/19 41.57 kg/m  04/18/19 38.97 kg/m  She has worked on her diet a lot, Cut out a lot of sweets She is on victoza and topiramate, managed by specialist Not on  wellbutrin for weight loss did not tolerate  MDD/mood disorder (chart notes bipolar) Pt goes to [place], she just lost her son last month due to OD, other son (twin) has gone off the deep end and is binge drinking and pt is very worried about him "he is holed up in a hotel somewhere"  She is more sad, tearful, still not drinking  She is working with therapist - talking to him frequently She continues to take Cymbalta 60 mg BID, compliant with meds, no SE.  She would like to est with psych for more help and possible med adjustment with worse sx, recent trauma, hx of bipolar 2 per pt - she states "I think I run a little manic" Depression screen [facility] 2/9 08/21/2019 04/08/2019 01/04/2019  Decreased Interest 3 0 1  Down, Depressed, Hopeless 3 1 2   PHQ - 2 Score 6 1 3   Altered sleeping 3 0 2  Tired, decreased energy 3 0 2  Change in appetite 1 0 1  Feeling bad or failure about yourself  - 0 1  Trouble concentrating 0 0 0  Moving slowly or fidgety/restless 0 0 0  Suicidal thoughts 0 0 0  PHQ-9 Score 13 1 9   Difficult doing work/chores Very difficult Not difficult at all Somewhat difficult  Some recent data might be hidden    Emphysema/asthma/COPD - stable, no recent changes or exacerbation, did well  with recent surgery, no worse exertional sx  per CT scan 2019 (low dose CT screening for lung cancer)- she reports dx of asthma - she is on anoro and albuterol - she states they are helping her symptoms, she does feels like her weight is part of her SOB, she uses the albuterol inhaler more frequently with triggers like muggy weather or her anxiety, overall using it about 2-3 x a week.  No nighttime waking, uses CPAP No recent asthma exacerbation or COPD exacerbation requiring meds/steroids/hospitalization She denies any worsening SOB/DOE, cough, wheeze, CP, night sweats, unintentional weight loss She would like to do her annual Low dose CT - doesn't feel like she will have time in December, but wants to get  in contact with team to arrange - agrees to me sending info to [facility] to f/up, overdue for annual scan Needs refills today on inhalers   Hyperlipidemia: Current Medication Regimen:  lipitor 10 mg good compliance Last Lipids: Lab Results  Component Value Date   CHOL 144 04/08/2019   HDL 38 (L) 04/08/2019   LDLCALC 79 04/08/2019   TRIG 175 (H) 04/08/2019   CHOLHDL 3.8 04/08/2019  she previously did weight management and nutrition in the past - she is still working on healthy diet on her own, lost a few lbs, as she gets clearance from ortho she is trying to do more walking/physical activity - Current Diet:  She is working on [employer], low fat, low salt - Denies: Chest pain, shortness of breath, myalgias. - Risk factors for atherosclerosis: hypercholesterolemia, hypertension and smoking   She also mentions bumps and lesions to scalp that are getting worse, she wants referral to dermatology - she has to "dig at them" two lesions to back top of scalp, mildly tender, no OTC meds or shampoos tried yet  Patient Active Problem List   Diagnosis Date Noted  . Status post total hip replacement, left 07/23/2019  . Insulin resistance 02/18/2019  . Prediabetes 12/31/2018  . Depression 10/23/2018  . Class 2 severe obesity with serious comorbidity and body mass index (BMI) of 39.0 to 39.9 in adult ([place]) 10/23/2018  . Allergic rhinitis 08/23/2018  . Aortic atherosclerosis ([place]) 02/20/2018  . Centrilobular emphysema ([place]) 02/20/2018  . Dyslipidemia 03/28/2017  . Cocaine use disorder ([place]) 02/27/2017  . Degenerative disc disease at L5-S1 level 09/28/2016  . GERD (gastroesophageal reflux disease) 12/17/2015  . Chronic back pain 10/09/2015  . Post-traumatic osteoarthritis of right knee 09/24/2015  . Class 2 severe obesity with serious comorbidity and body mass index (BMI) of 38.0 to 38.9 in adult ([place]) 08/06/2015  . Alcoholism in recovery ([place]) 04/13/2015  . Vitamin D deficiency 04/09/2015  .  Vitamin B12 deficiency 04/09/2015  . OSA on CPAP 04/09/2015  . Subclinical hyperthyroidism 06/26/2014  . Toxic multinodular goiter 06/26/2014  . Hypertension 06/26/2014  . Bipolar disorder ([place]) 06/26/2014  . Lumbar radiculitis 10/03/2013  . Diverticulosis 08/24/2013    Past Surgical History:  Procedure Laterality Date  . BILATERAL SALPINGOOPHORECTOMY     due to abnormal mass  . BREAST BIOPSY Left    neg  . BREAST SURGERY Left 20 yrs ago  . CESAREAN SECTION    . COLONOSCOPY    . COLONOSCOPY WITH PROPOFOL N/A 10/16/2017   Procedure: COLONOSCOPY WITH PROPOFOL;  Surgeon: Jonathon Bellows, MD;  Location: [facility] ENDOSCOPY;  Service: Gastroenterology;  Laterality: N/A;  . HERNIA REPAIR  06/2011, July 2014   Ventral wall repair with Physiomesh  . HERNIA REPAIR  2nd.vental wall repair  . JOINT REPLACEMENT Right    knee  . TONSILLECTOMY    . TOTAL HIP ARTHROPLASTY Left 07/23/2019   Procedure: TOTAL HIP ARTHROPLASTY;  Surgeon: Corky Mull, MD;  Location: [facility] ORS;  Service: Orthopedics;  Laterality: Left;  . TOTAL KNEE ARTHROPLASTY Right 05/10/2016   Procedure: TOTAL KNEE ARTHROPLASTY;  Surgeon: Corky Mull, MD;  Location: [facility] ORS;  Service: Orthopedics;  Laterality: Right;  . TUBAL LIGATION      Family History  Problem Relation Age of Onset  . Alcohol abuse Father   . Heart attack Father   . Arthritis Mother   . Asthma Mother   . Mental illness Mother   . Thyroid disease Mother   . COPD Mother   . Heart disease Mother   . Congestive Heart Failure Mother   . Alcohol abuse Mother   . Eating disorder Mother   . Arthritis Brother   . Mental illness Brother   . Cancer Brother        non-hodkins lymphoma  . Alcohol abuse Sister   . Drug abuse Sister   . Mental illness Sister   . Mental illness Sister   . Fibromyalgia Sister   . Obesity Sister   . Pneumonia Sister   . Mental illness Sister   . Alcohol abuse Sister   . Drug abuse Sister   . Diabetes Neg Hx   . Stroke Neg Hx    . Breast cancer Neg Hx     Social History   Tobacco Use  . Smoking status: Current Every Day Smoker    Packs/day: 0.50    Years: 41.00    Pack years: 20.50    Types: Cigarettes  . Smokeless tobacco: Never Used  Substance Use Topics  . Alcohol use: No    Alcohol/week: 0.0 standard drinks    Comment: sober since October 2018  . Drug use: No    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      Current Outpatient Medications:  .  albuterol (VENTOLIN HFA) 108 (90 Base) MCG/ACT inhaler, INHALE 1 TO 2 PUFFS EVERY 4 HOURS AS NEEDED FOR COUGH, WHEEZING, AND SHORTNESS OF BREATH (Patient taking differently: Inhale 1-2 puffs into the lungs every 4 (four) hours as needed for wheezing or shortness of breath. ), Disp: 18 g, Rfl: 2 .  amLODipine (NORVASC) 5 MG tablet, TAKE 1 TABLET EVERY DAY (Patient taking differently: Take 5 mg by mouth daily. ), Disp: 90 tablet, Rfl: 1 .  atenolol (TENORMIN) 25 MG tablet, TAKE 1 TABLET EVERY DAY (Patient taking differently: Take 25 mg by mouth daily. ), Disp: 90 tablet, Rfl: 1 .  atorvastatin (LIPITOR) 10 MG tablet, TAKE 1 TABLET AT BEDTIME (Patient taking differently: Take 10 mg by mouth at bedtime. ), Disp: 90 tablet, Rfl: 1 .  clotrimazole (LOTRIMIN) 1 % cream, Apply 1 application topically 2 (two) times daily., Disp: 30 g, Rfl: 0 .  cyclobenzaprine (FLEXERIL) 5 MG tablet, Take 5 mg by mouth 2 (two) times daily as needed for muscle spasms. , Disp: , Rfl:  .  DULoxetine (CYMBALTA) 60 MG capsule, Take 1 capsule (60 mg total) by mouth 2 (two) times daily., Disp: 180 capsule, Rfl: 1 .  gabapentin (NEURONTIN) 300 MG capsule, Take 600 mg by mouth 2 (two) times daily. , Disp: , Rfl:  .  loratadine (CLARITIN) 10 MG tablet, Take 1 tablet (10 mg total) by mouth daily as needed for allergies. (Patient taking differently:  Take 10 mg by mouth daily. ), Disp: 90 tablet, Rfl: 3 .  losartan (COZAAR) 100 MG tablet, TAKE 1 TABLET EVERY DAY (Patient taking differently:  Take 100 mg by mouth daily. ), Disp: 90 tablet, Rfl: 1 .  oxyCODONE (OXY IR/ROXICODONE) 5 MG immediate release tablet, Take 1-2 tablets (5-10 mg total) by mouth every 4 (four) hours as needed for moderate pain (pain score 4-6)., Disp: 60 tablet, Rfl: 0 .  traMADol (ULTRAM) 50 MG tablet, Take 1 tablet (50 mg total) by mouth every 6 (six) hours., Disp: 30 tablet, Rfl: 0 .  umeclidinium-vilanterol (ANORO ELLIPTA) 62.5-25 MCG/INH AEPB, Inhale 1 puff into the lungs daily., Disp: 60 each, Rfl: 2 .  enoxaparin (LOVENOX) 40 MG/0.4ML injection, Inject 0.4 mLs (40 mg total) into the skin daily. (Patient not taking: Reported on 08/21/2019), Disp: 5.6 mL, Rfl: 0 .  omeprazole (PRILOSEC) 40 MG capsule, TAKE 1 CAPSULE (40 MG TOTAL) BY MOUTH DAILY., Disp: 30 capsule, Rfl: 2 .  vitamin B-12 (CYANOCOBALAMIN) 1000 MCG tablet, Take 1,000 mcg by mouth daily., Disp: , Rfl:  .  Vitamin D, Ergocalciferol, (DRISDOL) 1.25 MG (50000 UT) CAPS capsule, Take 1 capsule (50,000 Units total) by mouth every 3 (three) days. (Patient not taking: Reported on 08/21/2019), Disp: 10 capsule, Rfl: 0  Allergies  Allergen Reactions  . Hydrochlorothiazide Other (See Comments)    Electrolyte imbalance  . Lasix [Furosemide] Other (See Comments)    Electrolyte imbalance    Chart Review Today: I personally reviewed active problem list, medication list, allergies, family history, social history, health maintenance, notes from last encounter, lab results, imaging with the patient/caregiver today.   Review of Systems  10 Systems reviewed and are negative for acute change except as noted in the HPI.    Objective:    Vitals:   08/21/19 1033  BP: 122/70  Pulse: 85  Resp: 14  Temp: 97.8 F (36.6 C)  SpO2: 98%  Weight: 224 lb (101.6 kg)  Height: 5\' 3"  (1.6 m)    Body mass index is 39.68 kg/m.  Physical Exam Vitals and nursing note reviewed.  Constitutional:      General: She is not in acute distress.    Appearance: Normal  appearance. She is well-developed. She is obese. She is not ill-appearing, toxic-appearing or diaphoretic.     Interventions: Face mask in place.  HENT:     Head: Normocephalic and atraumatic.     Right Ear: External ear normal.     Left Ear: External ear normal.  Eyes:     General: Lids are normal. No scleral icterus.       Right eye: No discharge.        Left eye: No discharge.     Conjunctiva/sclera: Conjunctivae normal.  Neck:     Trachea: Phonation normal. No tracheal deviation.  Cardiovascular:     Rate and Rhythm: Normal rate and regular rhythm.     Pulses: Normal pulses.          Radial pulses are 2+ on the right side and 2+ on the left side.       Posterior tibial pulses are 2+ on the right side and 2+ on the left side.     Heart sounds: Normal heart sounds. No murmur. No friction rub. No gallop.   Pulmonary:     Effort: Pulmonary effort is normal. No respiratory distress.     Breath sounds: Normal breath sounds. No stridor. No wheezing, rhonchi or rales.  Chest:  Chest wall: No tenderness.  Abdominal:     General: Bowel sounds are normal. There is no distension.     Palpations: Abdomen is soft.     Tenderness: There is no abdominal tenderness. There is no guarding or rebound.  Musculoskeletal:     Cervical back: Normal range of motion and neck supple.     Right lower leg: No edema.     Left lower leg: No edema.  Lymphadenopathy:     Cervical: No cervical adenopathy.  Skin:    General: Skin is warm and dry.     Capillary Refill: Capillary refill takes less than 2 seconds.     Coloration: Skin is not jaundiced or pale.     Findings: No rash.     Comments: Scalp lesions roughly 1.5 cm in diameter raised some nodular some more erythematous patches, no ttp no drainage, no alopecia  Neurological:     Mental Status: She is alert and oriented to person, place, and time.     Motor: No abnormal muscle tone.     Gait: Gait abnormal.  Psychiatric:        Attention and  Perception: Attention normal.        Mood and Affect: Affect is tearful.        Speech: Speech normal.        Behavior: Behavior normal. Behavior is cooperative.        Thought Content: Thought content does not include homicidal or suicidal ideation. Thought content does not include homicidal or suicidal plan.      Fall Risk: Fall Risk  08/21/2019 04/08/2019 01/04/2019 08/23/2018 07/23/2018  Falls in the past year? 0 0 0 0 1  Number falls in past yr: 0 0 0 - 1  Injury with Fall? 0 0 0 - 0  Risk for fall due to : - - - - History of fall(s)  Risk for fall due to: Comment - - - - -  Follow up - - - - Falls evaluation completed    Functional Status Survey: Is the patient deaf or have difficulty hearing?: No Does the patient have difficulty seeing, even when wearing glasses/contacts?: No Does the patient have difficulty concentrating, remembering, or making decisions?: No Does the patient have difficulty walking or climbing stairs?: No Does the patient have difficulty dressing or bathing?: No Does the patient have difficulty doing errands alone such as visiting a doctor's office or shopping?: No   Assessment & Plan:   1. Essential hypertension Stable, well controlled today, continue diet lifestyle efforts, continue Norvasc, atenolol, losartan Patient does wish to decrease her medications and has had some intermittent lower extremity edema that is resolved and not present today.  We did discuss her holding Norvasc for 1 to 2 weeks and monitoring her blood pressure at home.  She reports that doing home health physical therapy they have been monitoring her blood pressure with activity and her blood pressures have been low normal and well-controlled  - amLODipine (NORVASC) 5 MG tablet; Take 1 tablet (5 mg total) by mouth daily.  Dispense: 90 tablet; Refill: 3 - atenolol (TENORMIN) 25 MG tablet; Take 1 tablet (25 mg total) by mouth daily.  Dispense: 90 tablet; Refill: 3 - losartan (COZAAR) 100  MG tablet; Take 1 tablet (100 mg total) by mouth daily.  Dispense: 90 tablet; Refill: 3  2. Aortic atherosclerosis ([place]) Patient compliant with statin, controlling blood pressure and monitoring - atorvastatin (LIPITOR) 10 MG tablet; Take 1 tablet (10  mg total) by mouth at bedtime.  Dispense: 90 tablet; Refill: 3  3. Centrilobular emphysema ([place]) Patient states symptoms are stable diagnosis of asthma and emphysema per CT scan, medication sent to her new pharmacy - umeclidinium-vilanterol (ANORO ELLIPTA) 62.5-25 MCG/INH AEPB; Inhale 1 puff into the lungs daily.  Dispense: 60 each; Refill: 5 - albuterol (VENTOLIN HFA) 108 (90 Base) MCG/ACT inhaler; Inhale 2 puffs into the lungs every 4 (four) hours as needed for wheezing or shortness of breath.  Dispense: 18 g; Refill: 2  4. Dyslipidemia Compliant with meds, no SE, no myalgias, fatigue or jaundice Due for FLP and recheck CMP Diet recommendations for HLD reviewed and handout given - atorvastatin (LIPITOR) 10 MG tablet; Take 1 tablet (10 mg total) by mouth at bedtime.  Dispense: 90 tablet; Refill: 3 - COMPLETE METABOLIC PANEL WITH GFR - Lipid panel  5. Other depression Unfortunately patient is having a significant increase in her depressive symptoms she is very tearful today which is understandable with the traumatic and recent loss of her son due to an overdose.  She knew that he was having difficulty with alcohol but she had no idea that he was using heroin or opioids he died from a fentanyl overdose.  She has been speaking with a therapist regularly but her symptoms are much worse and it is making it difficult for her to function.  She would be interested in seeing psychiatry for med adjustments with her other psychiatric medical history specifically of bipolar.  She will continue to work with her therapist.  She previously was established at [facility] but her current insurance does not cover that practice and would like to establish elsewhere. -  DULoxetine (CYMBALTA) 60 MG capsule; Take 1 capsule (60 mg total) by mouth 2 (two) times daily.  Dispense: 180 capsule; Refill: 3 - Ambulatory referral to Psychiatry  6. Bipolar affective disorder in remission [facility]) - Ambulatory referral to Psychiatry  7. Grief at loss of child - Ambulatory referral to Psychiatry  8. Scalp lesion Possibly seborrheic dermatitis encouraged patient to try ketoconazole shampoo twice weekly also encouraged to try [facility] or other over-the-counter shampoos - Ambulatory referral to Dermatology - ketoconazole (NIZORAL) 2 % shampoo; Apply 1 application topically 2 (two) times a week.  Dispense: 120 mL; Refill: 0  9. Gastroesophageal reflux disease, unspecified whether esophagitis present Symptoms well controlled but patient continues to need omeprazole daily, discussed weaning trials periodically encouraged her to avoid food triggers, other lifestyle treatment and management for GERD - omeprazole (PRILOSEC) 40 MG capsule; Take 1 capsule (40 mg total) by mouth daily.  Dispense: 30 capsule; Refill: 2  10. Class 2 severe obesity with serious comorbidity and body mass index (BMI) of 39.0 to 39.9 in adult, unspecified obesity type [facility]) Patient has been working very diligently on healthy diet, cooking at home adding in lean meats and vegetables, she is decreasing portions and even postop she is trying to work on increasing her physical activity and exercise, she has lost a little bit of weight and she was encouraged in her efforts.  11. Alcoholism in recovery ([place]) Going to [place], still sober  12. Tobacco use Con't smoking, not interested in cessation at this time  13. Rash and nonspecific skin eruption Refill on topical med for rash to skin folds - clotrimazole (LOTRIMIN) 1 % cream; Apply 1 application topically 2 (two) times daily.  Dispense: 30 g; Refill: 0   Return for 4 month routine f/up.   Delsa Grana, PA-C 08/21/19  10:48 AM

## 2019-08-22 LAB — COMPLETE METABOLIC PANEL WITH GFR
AG Ratio: 2.2 (calc) (ref 1.0–2.5)
ALT: 11 U/L (ref 6–29)
AST: 12 U/L (ref 10–35)
Albumin: 4.4 g/dL (ref 3.6–5.1)
Alkaline phosphatase (APISO): 90 U/L (ref 37–153)
BUN: 11 mg/dL (ref 7–25)
CO2: 27 mmol/L (ref 20–32)
Calcium: 9.5 mg/dL (ref 8.6–10.4)
Chloride: 105 mmol/L (ref 98–110)
Creat: 0.92 mg/dL (ref 0.50–1.05)
GFR, Est African American: 80 mL/min/{1.73_m2} (ref >=60)
GFR, Est Non African American: 69 mL/min/{1.73_m2} (ref >=60)
Globulin: 2 g/dL (calc) (ref 1.9–3.7)
Glucose, Bld: 85 mg/dL (ref 65–99)
Potassium: 4.1 mmol/L (ref 3.5–5.3)
Sodium: 140 mmol/L (ref 135–146)
Total Bilirubin: 0.5 mg/dL (ref 0.2–1.2)
Total Protein: 6.4 g/dL (ref 6.1–8.1)

## 2019-08-22 LAB — LIPID PANEL
Cholesterol: 143 mg/dL (ref <200)
HDL: 47 mg/dL — ABNORMAL LOW (ref >=50)
LDL Cholesterol (Calc): 79 mg/dL (calc)
Non-HDL Cholesterol (Calc): 96 mg/dL (calc) (ref <130)
Total CHOL/HDL Ratio: 3 (calc) (ref <5.0)
Triglycerides: 83 mg/dL (ref <150)

## 2019-08-29 ENCOUNTER — Ambulatory Visit (INDEPENDENT_AMBULATORY_CARE_PROVIDER_SITE_OTHER): Payer: Medicare HMO | Admitting: Psychology

## 2019-08-29 DIAGNOSIS — F3289 Other specified depressive episodes: Secondary | ICD-10-CM | POA: Diagnosis not present

## 2019-08-30 ENCOUNTER — Telehealth: Payer: Self-pay

## 2019-08-30 NOTE — Telephone Encounter (Signed)
Called pt to tell her ins will not cover her cream and that Kristeen Miss said to get OTC clotimazole 1% cream which is the same thing

## 2019-08-31 ENCOUNTER — Ambulatory Visit: Payer: Medicare HMO

## 2019-08-31 ENCOUNTER — Ambulatory Visit: Payer: Medicare HMO | Attending: Internal Medicine

## 2019-08-31 DIAGNOSIS — Z23 Encounter for immunization: Secondary | ICD-10-CM

## 2019-08-31 NOTE — Progress Notes (Signed)
   Covid-19 Vaccination Clinic  Name:  Susan [name]    MRN: [id number] DOB: Jan 03, 1961  08/31/2019  Susan [name] was observed post Covid-19 immunization for 15 minutes without incident. She was provided with Vaccine Information Sheet and instruction to access the V-Safe system.   Susan [name] was instructed to call 911 with any severe reactions post vaccine: Marland Kitchen Difficulty breathing  . Swelling of face and throat  . A fast heartbeat  . A bad rash all over body  . Dizziness and weakness   Immunizations Administered    Name Date Dose VIS Date Route   Pfizer COVID-19 Vaccine 08/31/2019  8:58 AM 0.3 mL 07/03/2018 Intramuscular   Manufacturer: [place]   Lot: [id number]   [place]: [id number]

## 2019-09-12 ENCOUNTER — Ambulatory Visit (INDEPENDENT_AMBULATORY_CARE_PROVIDER_SITE_OTHER): Payer: Medicare HMO | Admitting: Psychology

## 2019-09-12 DIAGNOSIS — F3289 Other specified depressive episodes: Secondary | ICD-10-CM

## 2019-09-24 ENCOUNTER — Ambulatory Visit: Payer: Medicare HMO | Attending: Internal Medicine

## 2019-09-24 DIAGNOSIS — Z23 Encounter for immunization: Secondary | ICD-10-CM

## 2019-09-24 NOTE — Progress Notes (Signed)
   Covid-19 Vaccination Clinic  Name:  Susan [name]    MRN: [id number] DOB: November 03, 1960  09/24/2019  [name] was observed post Covid-19 immunization for 15 minutes without incident. She was provided with Vaccine Information Sheet and instruction to access the V-Safe system.   [name] was instructed to call 911 with any severe reactions post vaccine: Marland Kitchen Difficulty breathing  . Swelling of face and throat  . A fast heartbeat  . A bad rash all over body  . Dizziness and weakness   Immunizations Administered    Name Date Dose VIS Date Route   Pfizer COVID-19 Vaccine 09/24/2019  9:57 AM 0.3 mL 07/03/2018 Intramuscular   Manufacturer: [place]   Lot: [id number]   [place]: [id number]

## 2019-09-27 ENCOUNTER — Other Ambulatory Visit: Payer: Self-pay

## 2019-09-27 ENCOUNTER — Encounter: Payer: Self-pay | Admitting: Psychiatry

## 2019-09-27 ENCOUNTER — Telehealth (INDEPENDENT_AMBULATORY_CARE_PROVIDER_SITE_OTHER): Payer: Medicare HMO | Admitting: Psychiatry

## 2019-09-27 DIAGNOSIS — F3161 Bipolar disorder, current episode mixed, mild: Secondary | ICD-10-CM | POA: Diagnosis not present

## 2019-09-27 DIAGNOSIS — F1021 Alcohol dependence, in remission: Secondary | ICD-10-CM

## 2019-09-27 DIAGNOSIS — F4310 Post-traumatic stress disorder, unspecified: Secondary | ICD-10-CM | POA: Diagnosis not present

## 2019-09-27 DIAGNOSIS — Z634 Disappearance and death of family member: Secondary | ICD-10-CM | POA: Diagnosis not present

## 2019-09-27 DIAGNOSIS — F17200 Nicotine dependence, unspecified, uncomplicated: Secondary | ICD-10-CM

## 2019-09-27 DIAGNOSIS — F1421 Cocaine dependence, in remission: Secondary | ICD-10-CM

## 2019-09-27 MED ORDER — RISPERIDONE 0.5 MG PO TABS: 0.5000 mg | ORAL_TABLET | Freq: Every day | ORAL | 1 refills | Status: DC
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
## 2019-09-30 ENCOUNTER — Ambulatory Visit: Payer: Medicare HMO | Admitting: Psychology

## 2019-10-16 ENCOUNTER — Ambulatory Visit (INDEPENDENT_AMBULATORY_CARE_PROVIDER_SITE_OTHER): Payer: Medicare HMO | Admitting: Family Medicine

## 2019-10-16 ENCOUNTER — Other Ambulatory Visit: Payer: Self-pay

## 2019-10-16 ENCOUNTER — Encounter: Payer: Self-pay | Admitting: Family Medicine

## 2019-10-16 VITALS — BP 122/74 | HR 98 | Temp 97.9°F | Resp 14 | Ht 62.0 in | Wt 235.5 lb

## 2019-10-16 DIAGNOSIS — R0600 Dyspnea, unspecified: Secondary | ICD-10-CM | POA: Diagnosis not present

## 2019-10-16 DIAGNOSIS — R600 Localized edema: Secondary | ICD-10-CM

## 2019-10-16 DIAGNOSIS — J432 Centrilobular emphysema: Secondary | ICD-10-CM

## 2019-10-16 DIAGNOSIS — Z6841 Body Mass Index (BMI) 40.0 and over, adult: Secondary | ICD-10-CM

## 2019-10-16 DIAGNOSIS — R0609 Other forms of dyspnea: Secondary | ICD-10-CM

## 2019-10-16 DIAGNOSIS — I10 Essential (primary) hypertension: Secondary | ICD-10-CM | POA: Diagnosis not present

## 2019-10-16 DIAGNOSIS — E66813 Obesity, class 3: Secondary | ICD-10-CM

## 2019-10-16 DIAGNOSIS — R631 Polydipsia: Secondary | ICD-10-CM

## 2019-10-16 MED ORDER — TRELEGY ELLIPTA 100-62.5-25 MCG/INH IN AEPB: 1.0000 | INHALATION_SPRAY | Freq: Every day | RESPIRATORY_TRACT | 11 refills | Status: DC

## 2019-10-16 NOTE — Patient Instructions (Signed)
Edema  Edema is when you have too much fluid in your body or under your skin. Edema may make your legs, feet, and ankles swell up. Swelling is also common in looser tissues, like around your eyes. This is a common condition. It gets more common as you get older. There are many possible causes of edema. Eating too much salt (sodium) and being on your feet or sitting for a long time can cause edema in your legs, feet, and ankles. Hot weather may make edema worse. Edema is usually painless. Your skin may look swollen or shiny. Follow these instructions at home:  Keep the swollen body part raised (elevated) above the level of your heart when you are sitting or lying down.  Do not sit still or stand for a long time.  Do not wear tight clothes. Do not wear garters on your upper legs.  Exercise your legs. This can help the swelling go down.  Wear elastic bandages or support stockings as told by your doctor.  Eat a low-salt (low-sodium) diet to reduce fluid as told by your doctor.  Depending on the cause of your swelling, you may need to limit how much fluid you drink (fluid restriction).  Take over-the-counter and prescription medicines only as told by your doctor. Contact a doctor if:  Treatment is not working.  You have heart, liver, or kidney disease and have symptoms of edema.  You have sudden and unexplained weight gain. Get help right away if:  You have shortness of breath or chest pain.  You cannot breathe when you lie down.  You have pain, redness, or warmth in the swollen areas.  You have heart, liver, or kidney disease and get edema all of a sudden.  You have a fever and your symptoms get worse all of a sudden. Summary  Edema is when you have too much fluid in your body or under your skin.  Edema may make your legs, feet, and ankles swell up. Swelling is also common in looser tissues, like around your eyes.  Raise (elevate) the swollen body part above the level of your  heart when you are sitting or lying down.  Follow your doctor's instructions about diet and how much fluid you can drink (fluid restriction). This information is not intended to replace advice given to you by your health care provider. Make sure you discuss any questions you have with your health care provider. Document Revised: 04/28/2017 Document Reviewed: 05/13/2016 Elsevier Patient Education  2020 [place].   Chronic Venous Insufficiency Chronic venous insufficiency is a condition where the leg veins cannot effectively pump blood from the legs to the heart. This happens when the vein walls are either stretched, weakened, or damaged, or when the valves inside the vein are damaged. With the right treatment, you should be able to continue with an active life. This condition is also called venous stasis. What are the causes? Common causes of this condition include:  High blood pressure inside the veins (venous hypertension).  Sitting or standing too long, causing increased blood pressure in the leg veins.  A blood clot that blocks blood flow in a vein (deep vein thrombosis, DVT).  Inflammation of a vein (phlebitis) that causes a blood clot to form.  Tumors in the pelvis that cause blood to back up. What increases the risk? The following factors may make you more likely to develop this condition:  Having a family history of this condition.  Obesity.  Pregnancy.  Living without  enough regular physical activity or exercise (sedentary lifestyle).  Smoking.  Having a job that requires long periods of standing or sitting in one place.  Being a certain age. Women in their 54s and 78s and men in their 60s are more likely to develop this condition. What are the signs or symptoms? Symptoms of this condition include:  Veins that are enlarged, bulging, or twisted (varicose veins).  Skin breakdown or ulcers.  Reddened skin or dark discoloration of skin on the leg between the knee  and ankle.  Brown, smooth, tight, and painful skin just above the ankle, usually on the inside of the leg (lipodermatosclerosis).  Swelling of the legs. How is this diagnosed? This condition may be diagnosed based on:  Your medical history.  A physical exam.  Tests, such as: ? A procedure that creates an image of a blood vessel and nearby organs and provides information about blood flow through the blood vessel (duplex ultrasound). ? A procedure that tests blood flow (plethysmography). ? A procedure that looks at the veins using X-ray and dye (venogram). How is this treated? The goals of treatment are to help you return to an active life and to minimize pain or disability. Treatment depends on the severity of your condition, and it may include:  Wearing compression stockings. These can help relieve symptoms and help prevent your condition from getting worse. However, they do not cure the condition.  Sclerotherapy. This procedure involves an injection of a solution that shrinks damaged veins.  Surgery. This may involve: ? Removing a diseased vein (vein stripping). ? Cutting off blood flow through the vein (laser ablation surgery). ? Repairing or reconstructing a valve within the affected vein. Follow these instructions at home:      Wear compression stockings as told by your health care provider. These stockings help to prevent blood clots and reduce swelling in your legs.  Take over-the-counter and prescription medicines only as told by your health care provider.  Stay active by exercising, walking, or doing different activities. Ask your health care provider what activities are safe for you and how much exercise you need.  Drink enough fluid to keep your urine pale yellow.  Do not use any products that contain nicotine or tobacco, such as cigarettes, e-cigarettes, and chewing tobacco. If you need help quitting, ask your health care provider.  Keep all follow-up visits as  told by your health care provider. This is important. Contact a health care provider if you:  Have redness, swelling, or more pain in the affected area.  See a red streak or line that goes up or down from the affected area.  Have skin breakdown or skin loss in the affected area, even if the breakdown is small.  Get an injury in the affected area. Get help right away if:  You get an injury and an open wound in the affected area.  You have: ? Severe pain that does not get better with medicine. ? Sudden numbness or weakness in the foot or ankle below the affected area. ? Trouble moving your foot or ankle. ? A fever. ? Worse or persistent symptoms. ? Chest pain. ? Shortness of breath. Summary  Chronic venous insufficiency is a condition where the leg veins cannot effectively pump blood from the legs to the heart.  Chronic venous insufficiency occurs when the vein walls become stretched, weakened, or damaged, or when valves within the vein are damaged.  Treatment depends on how severe your condition is. It often  involves wearing compression stockings and may involve having a procedure.  Make sure you stay active by exercising, walking, or doing different activities. Ask your health care provider what activities are safe for you and how much exercise you need. This information is not intended to replace advice given to you by your health care provider. Make sure you discuss any questions you have with your health care provider. Document Revised: 01/16/2018 Document Reviewed: 01/16/2018 [employer] Patient Education  [place].

## 2019-10-16 NOTE — Progress Notes (Signed)
Patient ID: Susan [name], female    DOB: 07-07-1960, 59 y.o.   MRN: [id number]  PCP: Delsa Grana, PA-C  Chief Complaint  Patient presents with  . Edema    bilateral abkles and legs    Subjective:   Susan [name] is a 59 y.o. female, presents to clinic with CC of the following:  HPI  Pt has HTN - has been well controlled with pmhx of intermittent LE edema Here for worsening LE edema to feet and ankles primarily Worse this past month She drove a lot last Sunday and immobile and legs down for a long time swelling was much worse for several days  She is trying to be careful with low salt diet and she drinks ample fluids  On amlodipine for some time and higher dose before - no prior specified amlodipine SE of LE edema Good renal function Not on diuretics -she reports a past allergy from diuretics on the chart there is hydrochlorothiazide and Lasix allergy she states it caused her to have abnormal electrolytes?  She states this occurred more than 10 years ago, reviewed patient's labs going back to 2013-minute do not have record of what happened or what else was occurring around that time  Lab Results  Component Value Date   CREATININE 0.92 08/21/2019   Lab Results  Component Value Date   BUN 11 08/21/2019   Lab Results  Component Value Date   GFRNONAA 69 08/21/2019   BP Readings from Last 3 Encounters:  10/16/19 122/74  08/21/19 122/70  07/24/19 122/61   Has been on amlidipine for 4-5+ years, on higher dose in the past, only decreased due to other meds increased, not due to SE  CHF sx?   No orthopnea, CP, palpitations,  OSA compliant with CPAP Breathing worse with her cigarette smoking, still compliant with anoro and albuterol, more DOE using albuterol  Feels breathing is worse with recent weight gain and increased snacking/sweets since starting risperidone - feelsl ike maybe even her leg swelling has been worse since risperidone May 21- she has worse moods and SI  she will call and get sooner f/up with psych - does have an appt in 2 weeks    Urinary sx?  shes drinking a lot and doesn't feel like shes peeing as much as she should with her fluid intake  She reports a change in her psychiatric medications she does not feel like Resporal is helping her very much she feels worsening mood, worsening SI, significant weight increase in appetite increase that is negatively impacting her other health conditions including increased weight, she feels the swelling has occurred with this, worsening breathing at night and when using CPAP and throughout the day.  She continues to feel more stress and increased anxiety and worse moods and has been smoking more increase up to 1 pack/day and she was at about half pack per day she feels her increase smoking is also making her breathing worse.  Has a history of emphysema and is on Anoro and using her inhaler more   Patient Active Problem List   Diagnosis Date Noted  . PTSD (post-traumatic stress disorder) 09/27/2019  . Bereavement 09/27/2019  . Cocaine use disorder, moderate, in sustained remission ([place]) 09/27/2019  . Status post total hip replacement, left 07/23/2019  . Primary osteoarthritis of left hip 06/14/2019  . Insulin resistance 02/18/2019  . Prediabetes 12/31/2018  . Depression 10/23/2018  . Class 2 severe obesity with serious comorbidity and body mass  index (BMI) of 39.0 to 39.9 in adult [facility]) 10/23/2018  . Allergic rhinitis 08/23/2018  . Aortic atherosclerosis ([place]) 02/20/2018  . Centrilobular emphysema ([place]) 02/20/2018  . Dyslipidemia 03/28/2017  . Cocaine use disorder ([place]) 02/27/2017  . Degenerative disc disease at L5-S1 level 09/28/2016  . Tobacco use disorder 04/21/2016  . GERD (gastroesophageal reflux disease) 12/17/2015  . Chronic back pain 10/09/2015  . Post-traumatic osteoarthritis of right knee 09/24/2015  . Class 2 severe obesity with serious comorbidity and body mass index (BMI) of 38.0 to 38.9  in adult ([place]) 08/06/2015  . Alcohol use disorder, severe, in sustained remission ([place]) 04/13/2015  . Vitamin D deficiency 04/09/2015  . Vitamin B12 deficiency 04/09/2015  . OSA on CPAP 04/09/2015  . Subclinical hyperthyroidism 06/26/2014  . Toxic multinodular goiter 06/26/2014  . Hypertension 06/26/2014  . Bipolar I disorder ([place]) 06/26/2014  . Lumbar radiculitis 10/03/2013  . Diverticulosis 08/24/2013      Current Outpatient Medications:  .  albuterol (VENTOLIN HFA) 108 (90 Base) MCG/ACT inhaler, Inhale 2 puffs into the lungs every 4 (four) hours as needed for wheezing or shortness of breath., Disp: 18 g, Rfl: 2 .  amLODipine (NORVASC) 5 MG tablet, Take 1 tablet (5 mg total) by mouth daily., Disp: 90 tablet, Rfl: 3 .  atenolol (TENORMIN) 25 MG tablet, Take 1 tablet (25 mg total) by mouth daily., Disp: 90 tablet, Rfl: 3 .  atorvastatin (LIPITOR) 10 MG tablet, Take 1 tablet (10 mg total) by mouth at bedtime., Disp: 90 tablet, Rfl: 3 .  clotrimazole (LOTRIMIN) 1 % cream, Apply 1 application topically 2 (two) times daily., Disp: 30 g, Rfl: 0 .  cyclobenzaprine (FLEXERIL) 5 MG tablet, Take 5 mg by mouth 2 (two) times daily as needed for muscle spasms. , Disp: , Rfl:  .  DULoxetine (CYMBALTA) 60 MG capsule, Take 1 capsule (60 mg total) by mouth 2 (two) times daily., Disp: 180 capsule, Rfl: 3 .  gabapentin (NEURONTIN) 300 MG capsule, Take 600 mg by mouth 2 (two) times daily. , Disp: , Rfl:  .  HYDROcodone-acetaminophen (NORCO/VICODIN) 5-325 MG tablet, 1 po qday prn., Disp: , Rfl:  .  ketoconazole (NIZORAL) 2 % shampoo, Apply 1 application topically 2 (two) times a week., Disp: 120 mL, Rfl: 0 .  loratadine (CLARITIN) 10 MG tablet, Take 1 tablet (10 mg total) by mouth daily as needed for allergies. (Patient taking differently: Take 10 mg by mouth daily. ), Disp: 90 tablet, Rfl: 3 .  losartan (COZAAR) 100 MG tablet, Take 1 tablet (100 mg total) by mouth daily., Disp: 90 tablet, Rfl: 3 .   meloxicam (MOBIC) 15 MG tablet, Take by mouth., Disp: , Rfl:  .  omeprazole (PRILOSEC) 40 MG capsule, Take 1 capsule (40 mg total) by mouth daily., Disp: 30 capsule, Rfl: 2 .  risperiDONE (RISPERDAL) 0.5 MG tablet, Take 1 tablet (0.5 mg total) by mouth at bedtime., Disp: 30 tablet, Rfl: 1 .  umeclidinium-vilanterol (ANORO ELLIPTA) 62.5-25 MCG/INH AEPB, Inhale 1 puff into the lungs daily., Disp: 60 each, Rfl: 5 .  vitamin B-12 (CYANOCOBALAMIN) 1000 MCG tablet, Take 1,000 mcg by mouth daily., Disp: , Rfl:  .  HYDROcodone-acetaminophen (NORCO/VICODIN) 5-325 MG tablet, Take 1 tablet by mouth daily as needed., Disp: , Rfl:    Allergies  Allergen Reactions  . Hydrochlorothiazide Other (See Comments)    Electrolyte imbalance  . Lasix [Furosemide] Other (See Comments)    Electrolyte imbalance     Family History  Problem Relation Age  of Onset  . Alcohol abuse Father   . Heart attack Father   . Arthritis Mother   . Asthma Mother   . Mental illness Mother   . Thyroid disease Mother   . COPD Mother   . Heart disease Mother   . Congestive Heart Failure Mother   . Alcohol abuse Mother   . Eating disorder Mother   . Bipolar disorder Mother   . Arthritis Brother   . Mental illness Brother   . Cancer Brother        non-hodkins lymphoma  . Alcohol abuse Sister   . Drug abuse Sister   . Mental illness Sister   . Mental illness Sister   . Fibromyalgia Sister   . Obesity Sister   . Pneumonia Sister   . Depression Sister   . Mental illness Sister   . Alcohol abuse Sister   . Drug abuse Sister   . Drug abuse Son   . Alcohol abuse Son   . Drug abuse Son   . Alcohol abuse Son   . Drug abuse Daughter   . Diabetes Neg Hx   . Stroke Neg Hx   . Breast cancer Neg Hx      Social History   Socioeconomic History  . Marital status: Widowed    Spouse name: Not on file  . Number of children: 3  . Years of education: Not on file  . Highest education level: Some college, no degree    Occupational History  . Occupation: disabled  Tobacco Use  . Smoking status: Current Every Day Smoker    Packs/day: 1.00    Years: 41.00    Pack years: 41.00    Types: Cigarettes  . Smokeless tobacco: Never Used  Substance and Sexual Activity  . Alcohol use: No    Alcohol/week: 0.0 standard drinks    Comment: sober since October 2018  . 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 Sexual activity: Not Currently  Other Topics Concern  . Not on file  Social History Narrative  . Not on file   Social Determinants of Health   Financial Resource Strain:   . Difficulty of Paying Living Expenses:   Food Insecurity:   . Worried About Charity fundraiser in the Last Year:   . Arboriculturist in the Last Year:   Transportation Needs:   . Film/video editor (Medical):   Marland Kitchen Lack of Transportation (Non-Medical):   Physical Activity:   . Days of Exercise per Week:   . Minutes of Exercise per Session:   Stress:   . Feeling of Stress :   Social Connections:   . Frequency of Communication with Friends and Family:   . Frequency of Social Gatherings with Friends and Family:   . Attends Religious Services:   . Active Member of Clubs or Organizations:   . Attends Archivist Meetings:   Marland Kitchen Marital Status:   Intimate Partner Violence:   . Fear of Current or Ex-Partner:   . Emotionally Abused:   Marland Kitchen Physically Abused:   . Sexually Abused:     Chart Review Today: I personally reviewed active problem list, medication list, allergies, family history, social history, health maintenance, notes from last encounter, lab results, imaging with the patient/caregiver today.   Review of Systems  Constitutional: Positive for unexpected weight change. Negative for activity change, appetite change, chills and diaphoresis.  HENT: Negative.  Eyes: Negative.   Respiratory: Positive for shortness of breath. Negative for cough, choking, chest tightness and  wheezing.   Cardiovascular: Positive for leg swelling. Negative for chest pain and palpitations.  Gastrointestinal: Negative.   Endocrine: Negative.   Genitourinary: Negative.   Musculoskeletal: Negative.   Skin: Negative.   Allergic/Immunologic: Negative.   Neurological: Negative.   Hematological: Negative.   All other systems reviewed and are negative.  10 Systems reviewed and are negative for acute change except as noted in the HPI.     Objective:   Vitals:   10/16/19 1045  BP: 122/74  Pulse: 98  Resp: 14  Temp: 97.9 F (36.6 C)  SpO2: 96%  Weight: 235 lb 8 oz (106.8 kg)  Height: 5\' 2"  (1.575 m)    Body mass index is 43.07 kg/m.  Physical Exam Vitals and nursing note reviewed.  Constitutional:      General: She is not in acute distress.    Appearance: Normal appearance. She is well-developed. She is obese. She is not ill-appearing, toxic-appearing or diaphoretic.     Interventions: Face mask in place.  HENT:     Head: Normocephalic and atraumatic.     Right Ear: External ear normal.     Left Ear: External ear normal.  Eyes:     General: Lids are normal. No scleral icterus.       Right eye: No discharge.        Left eye: No discharge.     Conjunctiva/sclera: Conjunctivae normal.  Neck:     Trachea: Phonation normal. No tracheal deviation.  Cardiovascular:     Rate and Rhythm: Normal rate and regular rhythm.     Pulses: Normal pulses.          Radial pulses are 2+ on the right side and 2+ on the left side.       Posterior tibial pulses are 2+ on the right side and 2+ on the left side.     Heart sounds: Normal heart sounds. No murmur. No friction rub. No gallop.      Comments: Patient endorses lower extremity edema no pitting edema to feet ankles or shins bilaterally Pulmonary:     Effort: Tachypnea present. No accessory muscle usage, respiratory distress or retractions.     Breath sounds: Normal breath sounds. No stridor or decreased air movement. No  wheezing, rhonchi or rales.     Comments: Good breath sounds, mild increased work of breathing with mild tachypnea no retractions or distress Chest:     Chest wall: No tenderness.  Abdominal:     General: Bowel sounds are normal. There is no distension.     Palpations: Abdomen is soft.     Tenderness: There is no abdominal tenderness. There is no guarding or rebound.  Musculoskeletal:     Cervical back: Normal range of motion and neck supple.     Right lower leg: No edema.     Left lower leg: No edema.  Lymphadenopathy:     Cervical: No cervical adenopathy.  Skin:    General: Skin is warm and dry.     Capillary Refill: Capillary refill takes less than 2 seconds.     Coloration: Skin is not jaundiced or pale.     Findings: No rash.  Neurological:     Mental Status: She is alert and oriented to person, place, and time.     Motor: No abnormal muscle tone.     Gait: Gait normal.  Psychiatric:  Speech: Speech normal.        Behavior: Behavior normal.      Results for orders placed or performed in visit on 08/21/19  COMPLETE METABOLIC PANEL WITH GFR  Result Value Ref Range   Glucose, Bld 85 65 - 99 mg/dL   BUN 11 7 - 25 mg/dL   Creat 0.92 0.50 - 1.05 mg/dL   GFR, Est Non African American 69 > OR = 60 mL/min/1.19m2   GFR, Est African American 80 > OR = 60 mL/min/1.65m2   BUN/Creatinine Ratio NOT APPLICABLE 6 - 22 (calc)   Sodium 140 135 - 146 mmol/L   Potassium 4.1 3.5 - 5.3 mmol/L   Chloride 105 98 - 110 mmol/L   CO2 27 20 - 32 mmol/L   Calcium 9.5 8.6 - 10.4 mg/dL   Total Protein 6.4 6.1 - 8.1 g/dL   Albumin 4.4 3.6 - 5.1 g/dL   Globulin 2.0 1.9 - 3.7 g/dL (calc)   AG Ratio 2.2 1.0 - 2.5 (calc)   Total Bilirubin 0.5 0.2 - 1.2 mg/dL   Alkaline phosphatase (APISO) 90 37 - 153 U/L   AST 12 10 - 35 U/L   ALT 11 6 - 29 U/L  Lipid panel  Result Value Ref Range   Cholesterol 143 <200 mg/dL   HDL 47 (L) > OR = 50 mg/dL   Triglycerides 83 <150 mg/dL   LDL Cholesterol  (Calc) 79 mg/dL (calc)   Total CHOL/HDL Ratio 3.0 <5.0 (calc)   Non-HDL Cholesterol (Calc) 96 <130 mg/dL (calc)        Assessment & Plan:    1. Lower extremity edema Patient endorses worsening pedal edema over the past month and significantly increased over the weekend after being seated for long time driving, swelling had gradually increased over the past 3 to 4 days.  She is already careful to do a low-salt diet, she has some varicose veins to the left inner lower leg but no other varicosities or reticular veins.  If she has edema that is very subtle, she has no pitting edema today.   I do think that most likely she had some venous stasis and dependent edema due to a long car ride encouraged her to try and avoid stasis, take frequent breaks when driving, make sure to walk frequently, elevate her legs, get and wear some compression socks -at this time I do not think she needs any prescription compression stockings. She was encouraged to continue to avoid salt in her diet.  She also notes that she has had a significant increase in her weight and has been eating a lot of carbs and sweets lately, encouraged her to cut back on that and explained that some people may hold more fluid with diet changes or increase carbs. Encouraged her to increase her physical activity and follow-up with her psychiatrist regarding her concerns with her med changes.  I doubt any heart failure, kidney or liver disorders or syndromes it would cause edema but will check labs in her urine today.  She is on amlodipine which has been on for many years and has not had a lower extremity edema side effect in the past but we could possibly try stopping amlodipine and trying a lower dose hydrochlorothiazide with careful monitoring of her electrolytes?  I do think if she does conservative management with avoiding stasis, frequent walking, compression stockings, elevating legs that she may only need to very rarely use a  diuretic.  If symptoms do not improve  or worsen we can refer her to vascular for further eval of venous insufficiency - COMPLETE METABOLIC PANEL WITH GFR - TSH - Brain natriuretic peptide  2. Essential hypertension Well-controlled - COMPLETE METABOLIC PANEL WITH GFR  3. DOE (dyspnea on exertion) To suspect worsening emphysema/COPD with increased cigarette smoking but there is likely multiple factors including increased weight possibly causing obesity hypoventilation syndrome, deconditioning. She does have increased weight but this has not specifically corresponded with her lower extremity edema which has fluctuated she notes weight associated with med changes and increased appetite and eating more sweets and ice cream.  She denies palpitations, orthopnea.   She is using her albuterol inhaler more often, lungs are clear today, slight increased work of breathing but no retractions, accessory muscle use, respiratory distress, wheeze rales or rhonchi - COMPLETE METABOLIC PANEL WITH GFR - Brain natriuretic peptide  4. Centrilobular emphysema (HCC) Suspect some worsening of COPD/edema with increased smoking Possible trial of switching to Trelegy and discontinuing Anoro Encouraged her to cut back on amount of cigarette smoking and quit smoking if possible-understand this is difficult to do and unlikely to attempt when she has significant increased stress, recent loss and grief, change in meds from her psychiatrist has worsened her mood anxiety depression and SI she is going to call and follow-up with them today  5. Polydipsia Patient endorses excessive thirst all the time she drinks a lot of fluids and tries to drink mostly water No diabetes, recent labs showed normal fasting blood sugar and last A1c was normal about 7 months ago - COMPLETE METABOLIC PANEL WITH GFR - TSH - Microalbumin / creatinine urine ratio  6. Class 3 severe obesity with serious comorbidity and body mass index (BMI) of  40.0 to 44.9 in adult, unspecified obesity type (HCC)  11 pound weight increase in the past 2 months with a BMI over 40.  She is interested in medical weight management or bariatric surgery in the future is agreeable to consulting with a dietitian or nutritionist for medical nutritional therapy if covered by her insurance Wt Readings from Last 5 Encounters:  10/16/19 235 lb 8 oz (106.8 kg)  08/21/19 224 lb (101.6 kg)  07/24/19 229 lb 15 oz (104.3 kg)  07/16/19 230 lb (104.3 kg)  04/22/19 220 lb (99.8 kg)   BMI Readings from Last 5 Encounters:  10/16/19 43.07 kg/m  08/21/19 39.68 kg/m  07/24/19 42.06 kg/m  07/16/19 42.07 kg/m  04/22/19 41.57 kg/m   - Amb ref to Medical Nutrition Therapy-MNT   Return for 2-4 week f/up on LE edema.    Delsa Grana, PA-C 10/16/19 11:11 AM

## 2019-10-17 LAB — MICROALBUMIN / CREATININE URINE RATIO
Creatinine, Urine: 213 mg/dL (ref 20–275)
Microalb Creat Ratio: 6 mcg/mg creat (ref <30)
Microalb, Ur: 1.2 mg/dL

## 2019-10-17 LAB — BRAIN NATRIURETIC PEPTIDE: Brain Natriuretic Peptide: 24 pg/mL (ref <100)

## 2019-10-17 LAB — COMPLETE METABOLIC PANEL WITH GFR
AG Ratio: 2 (calc) (ref 1.0–2.5)
ALT: 15 U/L (ref 6–29)
AST: 15 U/L (ref 10–35)
Albumin: 4.1 g/dL (ref 3.6–5.1)
Alkaline phosphatase (APISO): 90 U/L (ref 37–153)
BUN: 13 mg/dL (ref 7–25)
CO2: 22 mmol/L (ref 20–32)
Calcium: 9 mg/dL (ref 8.6–10.4)
Chloride: 106 mmol/L (ref 98–110)
Creat: 0.92 mg/dL (ref 0.50–1.05)
GFR, Est African American: 80 mL/min/{1.73_m2} (ref >=60)
GFR, Est Non African American: 69 mL/min/{1.73_m2} (ref >=60)
Globulin: 2.1 g/dL (calc) (ref 1.9–3.7)
Glucose, Bld: 179 mg/dL — ABNORMAL HIGH (ref 65–99)
Potassium: 4 mmol/L (ref 3.5–5.3)
Sodium: 138 mmol/L (ref 135–146)
Total Bilirubin: 0.3 mg/dL (ref 0.2–1.2)
Total Protein: 6.2 g/dL (ref 6.1–8.1)

## 2019-10-17 LAB — TSH: TSH: 1.09 mIU/L (ref 0.40–4.50)

## 2019-10-18 ENCOUNTER — Telehealth: Payer: Self-pay

## 2019-10-18 ENCOUNTER — Ambulatory Visit (INDEPENDENT_AMBULATORY_CARE_PROVIDER_SITE_OTHER): Payer: Medicare HMO | Admitting: Psychology

## 2019-10-18 DIAGNOSIS — F3289 Other specified depressive episodes: Secondary | ICD-10-CM

## 2019-10-18 DIAGNOSIS — R739 Hyperglycemia, unspecified: Secondary | ICD-10-CM

## 2019-10-18 NOTE — Telephone Encounter (Signed)
-----   Message from Delsa Grana, [place] sent at 10/17/2019  6:02 PM EDT ----- Please notify pt of labwork  Blood sugar is high we are adding on A1C (please add on A1c for diagnosis hyperglycemia) Her kidney function liver function and electrolytes are normal, BNP negative Thyroid was normal, and urine testing was also normal  For now pt should do conservative management we discussed.  If she has severe swelling with pitting (leaving dense in her skin by pressing with her finger) then I want her to call and let us know.  For now her appearance when she was in clinic was said subtle swelling that I do not feel the diuretics are indicated or worth the risk.    She should be able to manage with compression socks, elevation, avoiding prolonged sitting or immobility, increased walking and activity.

## 2019-10-21 ENCOUNTER — Telehealth (HOSPITAL_COMMUNITY): Payer: Self-pay

## 2019-10-21 NOTE — Telephone Encounter (Signed)
This patient called the [facility] office and is a patient of Dr. Shea Evans.  She stated that her Risperidone 0.5mg  is not working and also stated that she doesn't think it's the right medication for her and would like to come off it. She stated that she's up at night and very fatigued and extremely tired. She stated that she has swelling in her feet and legs and has gained 15 pounds. She also stated that she's having suicidal thoughts but not acting on them. I told her that if at any time she does want to act on those thoughts, to please go to Emergency or call 911 right away. She wanted to let you know that she went to her PCP on Wednesday and they did bloodwork and urine. She also now has the number for Dr. Charlcie Cradle office if she needs to reach you from now on. Please review and advise. Thank you.

## 2019-10-21 NOTE — Telephone Encounter (Signed)
Spoke with patient over the phone and she reports that she has gained about 15 lbs since starting to take Risperdal, has been extremely tired and lethargic, noticed worsening of depression and having suicidal thoughts intermittently without any intention or plan and believes it is medication related because she did not have these thoughts prior to taking the Risperdal. She reports that she called to ask if she should taper down the Risperdal or discontinue. Recommended to discontinue Risperdal since she is only taking 0.5 mg. She verbalized understanding and reports that she has an appointment with Dr. Shea Evans on 06/22

## 2019-10-22 NOTE — Telephone Encounter (Signed)
Patient calling back for lab results. Requesting call back.

## 2019-10-23 NOTE — Telephone Encounter (Signed)
Left detailed vm °

## 2019-10-29 ENCOUNTER — Encounter: Payer: Self-pay | Admitting: Psychiatry

## 2019-10-29 ENCOUNTER — Other Ambulatory Visit: Payer: Self-pay

## 2019-10-29 ENCOUNTER — Telehealth (INDEPENDENT_AMBULATORY_CARE_PROVIDER_SITE_OTHER): Payer: Medicare HMO | Admitting: Psychiatry

## 2019-10-29 DIAGNOSIS — F3161 Bipolar disorder, current episode mixed, mild: Secondary | ICD-10-CM | POA: Diagnosis not present

## 2019-10-29 DIAGNOSIS — F17200 Nicotine dependence, unspecified, uncomplicated: Secondary | ICD-10-CM | POA: Diagnosis not present

## 2019-10-29 DIAGNOSIS — F1021 Alcohol dependence, in remission: Secondary | ICD-10-CM

## 2019-10-29 DIAGNOSIS — F4310 Post-traumatic stress disorder, unspecified: Secondary | ICD-10-CM | POA: Diagnosis not present

## 2019-10-29 DIAGNOSIS — F1421 Cocaine dependence, in remission: Secondary | ICD-10-CM

## 2019-10-29 DIAGNOSIS — Z634 Disappearance and death of family member: Secondary | ICD-10-CM | POA: Diagnosis not present

## 2019-10-29 MED ORDER — LURASIDONE HCL 20 MG PO TABS: 20.0000 mg | ORAL_TABLET | Freq: Every day | ORAL | 1 refills | Status: DC

## 2019-10-29 NOTE — Progress Notes (Signed)
Provider Location : [facility] Patient Location : Home  Virtual Visit via Video Note  I connected with Susan [name] on 10/29/19 at 11:30 AM EDT by a video enabled telemedicine application and verified that I am speaking with the correct person using two identifiers.   I discussed the limitations of evaluation and management by telemedicine and the availability of in person appointments. The patient expressed understanding and agreed to proceed.     I discussed the assessment and treatment plan with the patient. The patient was provided an opportunity to ask questions and all were answered. The patient agreed with the plan and demonstrated an understanding of the instructions.   The patient was advised to call back or seek an in-person evaluation if the symptoms worsen or if the condition fails to improve as anticipated.   [place] MD OP Progress Note  10/29/2019 7:35 PM [facility]  MRN:  [id number]  Chief Complaint:  Chief Complaint    Follow-up     HPI: Susan [name] is a 59 year old Caucasian female, widowed, on disability, lives in [place], has a history of bipolar disorder, alcohol and cocaine abuse currently in remission, obstructive sleep apnea on CPAP, hypertension, degenerative disc disease, status post total hip placement, osteoarthritis, hepatitis C was evaluated by telemedicine today.  Patient today reports she continues to struggle with mood lability.  She reports she is often depressed, has anxiety symptoms, irritability as well as sleep issues.  She reports when she took the risperidone her sleep problems worsened.  She did not like the way it made her feel since she also felt tired during the day.  She hence stopped taking it and ever since that her sleep has improved.  Patient reports she continues to follow-up with her therapist and that has been beneficial.  She however currently struggles with several psychosocial stressors including the death of her son and recent  death of her dog.  Patient reports it is difficult to cope with the loss however she has been trying her best.  She denies any suicidality, homicidality or perceptual disturbances.  Patient denies any other concerns today.  Visit Diagnosis:    ICD-10-CM   1. Bipolar disorder, current episode mixed, mild (HCC)  F31.61 lurasidone (LATUDA) 20 MG TABS tablet  2. PTSD (post-traumatic stress disorder)  F43.10   3. Bereavement  Z63.4   4. Tobacco use disorder  F17.200   5. Alcohol use disorder, severe, in sustained remission ([place])  F10.21   6. Cocaine use disorder, moderate, in sustained remission ([place])  F14.21     Past Psychiatric History: I have reviewed past psychiatric history from my progress note on 09/27/2019.  Past trials of Seroquel, Cymbalta, Abilify, risperidone  Past Medical History:  Past Medical History:  Diagnosis Date  . ADD (attention deficit disorder)   . Alcohol abuse   . Anemia   . Anxiety   . Aortic atherosclerosis ([place]) 02/20/2018   Chest CT Sept 2019  . Arthritis    rheumatoid arthritis  . Asthma   . Bipolar disorder ([place])   . Centrilobular emphysema ([place]) 02/20/2018   Chest CT Sept 2019  . Constipation   . Degenerative disc disease at L5-S1 level 09/28/2016   See ortho note May 2018  . Depression    bipolar, hx of suicide attempt  . Drug use   . Dyspnea    with exertion  . GERD (gastroesophageal reflux disease)   . H/O suicide attempt    slit wrists  .  Hepatitis C 06/26/2014  . Hip pain   . History of alcohol abuse   . History of diverticulitis 2013  . History of hepatitis C    HEP "C"--three years ago  . History of MRSA infection 2013  . HLD (hyperlipidemia)   . Hypertension   . Hypothyroidism   . Incisional hernia 11/08/2012  . Knee pain   . 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 Multinodular thyroid   . OSA (obstructive sleep apnea)    managed by Dr. Manuella Ghazi  . Osteoporosis   . Post-traumatic osteoarthritis of right knee 09/24/2015  . Prediabetes   .  Recurrent ventral hernia 11/08/2012  . Sleep apnea     Dx 3 years ago. Use C-PAP  . 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 Thyroid disease    Goiter  . Vitamin B12 deficiency   . Vitamin D deficiency disease     Past Surgical History:  Procedure Laterality Date  . BILATERAL SALPINGOOPHORECTOMY     due to abnormal mass  . BREAST BIOPSY Left    neg  . BREAST SURGERY Left 20 yrs ago  . CESAREAN SECTION    . COLONOSCOPY    . COLONOSCOPY WITH PROPOFOL N/A 10/16/2017   Procedure: COLONOSCOPY WITH PROPOFOL;  Surgeon: Jonathon Bellows, MD;  Location: [facility];  Service: Gastroenterology;  Laterality: N/A;  . HERNIA REPAIR  06/2011, July 2014   Ventral wall repair with Physiomesh  . HERNIA REPAIR     2nd.vental wall repair  . JOINT REPLACEMENT Right    knee  . TONSILLECTOMY    . TOTAL HIP ARTHROPLASTY Left 07/23/2019   Procedure: TOTAL HIP ARTHROPLASTY;  Surgeon: Corky Mull, MD;  Location: [facility] ORS;  Service: Orthopedics;  Laterality: Left;  . TOTAL KNEE ARTHROPLASTY Right 05/10/2016   Procedure: TOTAL KNEE ARTHROPLASTY;  Surgeon: Corky Mull, MD;  Location: [facility] ORS;  Service: Orthopedics;  Laterality: Right;  . TUBAL LIGATION      Family Psychiatric History: I have reviewed family psychiatric history from my progress note on 09/27/2019  Family History:  Family History  Problem Relation Age of Onset  . Alcohol abuse Father   . Heart attack Father   . Arthritis Mother   . Asthma Mother   . Mental illness Mother   . Thyroid disease Mother   . COPD Mother   . Heart disease Mother   . Congestive Heart Failure Mother   . Alcohol abuse Mother   . Eating disorder Mother   . Bipolar disorder Mother   . Arthritis Brother   . Mental illness Brother   . Cancer Brother        non-hodkins lymphoma  . Alcohol abuse Sister   . Drug abuse Sister   . Mental illness Sister   . Mental illness Sister   . Fibromyalgia Sister   . Obesity Sister   . Pneumonia  Sister   . Depression Sister   . Mental illness Sister   . Alcohol abuse Sister   . Drug abuse Sister   . Drug abuse Son   . Alcohol abuse Son   . Drug abuse Son   . Alcohol abuse Son   . Drug abuse Daughter   . Diabetes Neg Hx   . Stroke Neg Hx   . Breast cancer Neg Hx     Social History: I have reviewed social history from my progress note on 09/27/2019 Social History   Socioeconomic History  . Marital  status: Widowed    Spouse name: Not on file  . Number of children: 3  . Years of education: Not on file  . Highest education level: Some college, no degree  Occupational History  . Occupation: disabled  Tobacco Use  . Smoking status: Current Every Day Smoker    Packs/day: 1.00    Years: 41.00    Pack years: 41.00    Types: Cigarettes  . Smokeless tobacco: Never Used  Vaping Use  . Vaping Use: Former  . Quit date: 07/15/2017  Substance and Sexual Activity  . Alcohol use: No    Alcohol/week: 0.0 standard drinks    Comment: sober since October 2018  . 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 Sexual activity: Not Currently  Other Topics Concern  . Not on file  Social History Narrative  . Not on file   Social Determinants of Health   Financial Resource Strain:   . Difficulty of Paying Living Expenses:   Food Insecurity:   . Worried About Charity fundraiser in the Last Year:   . Arboriculturist in the Last Year:   Transportation Needs:   . Film/video editor (Medical):   Marland Kitchen Lack of Transportation (Non-Medical):   Physical Activity:   . Days of Exercise per Week:   . Minutes of Exercise per Session:   Stress:   . Feeling of Stress :   Social Connections:   . Frequency of Communication with Friends and Family:   . Frequency of Social Gatherings with Friends and Family:   . Attends Religious Services:   . Active Member of Clubs or Organizations:   . Attends Archivist Meetings:   Marland Kitchen Marital Status:     Allergies:   Allergies  Allergen Reactions  . Hydrochlorothiazide Other (See Comments)    Electrolyte imbalance  . Lasix [Furosemide] Other (See Comments)    Electrolyte imbalance    Metabolic Disorder Labs: Lab Results  Component Value Date   HGBA1C 5.4 04/08/2019   MPG 108 04/08/2019   MPG 103 08/16/2017   No results found for: PROLACTIN Lab Results  Component Value Date   CHOL 143 08/21/2019   TRIG 83 08/21/2019   HDL 47 (L) 08/21/2019   CHOLHDL 3.0 08/21/2019   VLDL 27 10/14/2016   LDLCALC 79 08/21/2019   LDLCALC 79 04/08/2019   Lab Results  Component Value Date   TSH 1.09 10/16/2019   TSH 1.430 07/26/2018    Therapeutic Level Labs: No results found for: LITHIUM No results found for: VALPROATE No components found for:  CBMZ  Current Medications: Current Outpatient Medications  Medication Sig Dispense Refill  . albuterol (VENTOLIN HFA) 108 (90 Base) MCG/ACT inhaler Inhale 2 puffs into the lungs every 4 (four) hours as needed for wheezing or shortness of breath. 18 g 2  . amLODipine (NORVASC) 5 MG tablet Take 1 tablet (5 mg total) by mouth daily. 90 tablet 3  . atenolol (TENORMIN) 25 MG tablet Take 1 tablet (25 mg total) by mouth daily. 90 tablet 3  . atorvastatin (LIPITOR) 10 MG tablet Take 1 tablet (10 mg total) by mouth at bedtime. 90 tablet 3  . clotrimazole (LOTRIMIN) 1 % cream Apply 1 application topically 2 (two) times daily. 30 g 0  . cyclobenzaprine (FLEXERIL) 5 MG tablet Take 5 mg by mouth 2 (two) times daily as needed for muscle spasms.     . DULoxetine (CYMBALTA)  60 MG capsule Take 1 capsule (60 mg total) by mouth 2 (two) times daily. 180 capsule 3  . Fluticasone-Umeclidin-Vilant (TRELEGY ELLIPTA) 100-62.5-25 MCG/INH AEPB Inhale 1 puff into the lungs daily. 28 each 11  . gabapentin (NEURONTIN) 300 MG capsule Take 600 mg by mouth 2 (two) times daily.     Marland Kitchen HYDROcodone-acetaminophen (NORCO/VICODIN) 5-325 MG tablet 1 po qday prn.    Marland Kitchen HYDROcodone-acetaminophen  (NORCO/VICODIN) 5-325 MG tablet Take 1 tablet by mouth daily as needed.    Marland Kitchen ketoconazole (NIZORAL) 2 % shampoo Apply 1 application topically 2 (two) times a week. 120 mL 0  . loratadine (CLARITIN) 10 MG tablet Take 1 tablet (10 mg total) by mouth daily as needed for allergies. (Patient taking differently: Take 10 mg by mouth daily. ) 90 tablet 3  . losartan (COZAAR) 100 MG tablet Take 1 tablet (100 mg total) by mouth daily. 90 tablet 3  . lurasidone (LATUDA) 20 MG TABS tablet Take 1 tablet (20 mg total) by mouth daily with supper. 30 tablet 1  . meloxicam (MOBIC) 15 MG tablet Take by mouth.    Marland Kitchen omeprazole (PRILOSEC) 40 MG capsule Take 1 capsule (40 mg total) by mouth daily. 30 capsule 2  . umeclidinium-vilanterol (ANORO ELLIPTA) 62.5-25 MCG/INH AEPB Inhale 1 puff into the lungs daily. 60 each 5  . vitamin B-12 (CYANOCOBALAMIN) 1000 MCG tablet Take 1,000 mcg by mouth daily.     No current facility-administered medications for this visit.     Musculoskeletal: Strength & Muscle Tone: UTA Gait & Station: normal Patient leans: N/A  Psychiatric Specialty Exam: Review of Systems  Psychiatric/Behavioral: Positive for dysphoric mood. The patient is nervous/anxious.   All other systems reviewed and are negative.   There were no vitals taken for this visit.There is no height or weight on file to calculate BMI.  General Appearance: Casual  Eye Contact:  Fair  Speech:  Clear and Coherent  Volume:  Normal  Mood:  Anxious and Depressed  Affect:  Congruent  Thought Process:  Goal Directed and Descriptions of Associations: Intact  Orientation:  Full (Time, Place, and Person)  Thought Content: Logical   Suicidal Thoughts:  No  Homicidal Thoughts:  No  Memory:  Immediate;   Fair Recent;   Fair Remote;   Fair  Judgement:  Fair  Insight:  Fair  Psychomotor Activity:  Normal  Concentration:  Concentration: Fair and Attention Span: Fair  Recall:  [employer] of Knowledge: Fair  Language: Fair   Akathisia:  No  Handed:  Right  AIMS (if indicated): UTA  Assets:  Communication Skills Desire for Improvement Housing Social Support  ADL's:  Intact  Cognition: WNL  Sleep:  Improving   Screenings: GAD-7     Office Visit from 09/06/2018 in [facility]  Total GAD-7 Score 14    PHQ2-9     Office Visit from 10/16/2019 in [facility] Office Visit from 08/21/2019 in [facility] Office Visit from 04/08/2019 in [facility] Office Visit from 01/04/2019 in [facility] Office Visit from 09/06/2018 in [place]  PHQ-2 Total Score 6 6 1 3 2   PHQ-9 Total Score 23 13 1 9 7        Assessment and Plan: TIERRIA WATSON is a 59 year old Caucasian female who has a history of bipolar disorder, degenerative disc disease, status post total hip replacement, essential hypertension, osteoarthritis, OSA on CPAP, gastroesophageal reflux disease, hepatitis C  was evaluated by telemedicine today.  She is biologically predisposed given her family history of mental health problems, her own history of substance abuse problems and trauma.  Patient with psychosocial stressors of recent deaths in the family, children who struggle with substance abuse problems, current pandemic, loss of her dog and her own health issues.  Patient had adverse side effects to the risperidone and will continue to benefit from medication readjustment and psychotherapy sessions.  Plan as noted below.  Plan Bipolar disorder current episode mixed-unstable Continue Cymbalta as prescribed. Discontinue risperidone for side effects. Start Latuda 20 mg p.o. daily with supper.  PTSD-unstable Continue Cymbalta as prescribed Continue melatonin over-the-counter at bedtime for sleep. Continue CBT with therapist Mr. Debbe Bales.  Bereavement-unstable Continue grief counseling  Tobacco use disorder-unstable Provided  counseling.  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   Follow-up in clinic in 3 weeks or sooner if needed.  I have spent atleast 20 minutes non face to face with patient today. More than 50 % of the time was spent for preparing to see the patient ( e.g., review of test, records ), ordering medications and test ,psychoeducation and supportive psychotherapy and care coordination,as well as documenting clinical information in electronic health record. This note was generated in part or whole with voice recognition software. Voice recognition is usually quite accurate but there are transcription errors that can and very often do occur. I apologize for any typographical errors that were not detected and corrected.      Ursula Alert, MD 10/29/2019, 7:35 PM

## 2019-10-31 ENCOUNTER — Ambulatory Visit (INDEPENDENT_AMBULATORY_CARE_PROVIDER_SITE_OTHER): Payer: Medicare HMO | Admitting: Psychology

## 2019-10-31 DIAGNOSIS — F3289 Other specified depressive episodes: Secondary | ICD-10-CM

## 2019-11-05 ENCOUNTER — Telehealth: Payer: Self-pay

## 2019-11-05 NOTE — Telephone Encounter (Signed)
I will advise to keep the appointment since she needs to be seen and we do not have any follow-up appointments available until September and I would not advise her to wait until then.

## 2019-11-05 NOTE — Telephone Encounter (Signed)
pt called states that she doing ok on the medication and wanted to know if she needed to keep her appt

## 2019-11-06 ENCOUNTER — Encounter: Payer: Self-pay | Admitting: Family Medicine

## 2019-11-06 ENCOUNTER — Other Ambulatory Visit: Payer: Self-pay

## 2019-11-06 ENCOUNTER — Ambulatory Visit (INDEPENDENT_AMBULATORY_CARE_PROVIDER_SITE_OTHER): Payer: Medicare HMO | Admitting: Family Medicine

## 2019-11-06 VITALS — BP 124/82 | HR 85 | Temp 97.6°F | Resp 18 | Ht 62.0 in | Wt 238.1 lb

## 2019-11-06 DIAGNOSIS — R0602 Shortness of breath: Secondary | ICD-10-CM

## 2019-11-06 DIAGNOSIS — R21 Rash and other nonspecific skin eruption: Secondary | ICD-10-CM

## 2019-11-06 DIAGNOSIS — E66813 Obesity, class 3: Secondary | ICD-10-CM

## 2019-11-06 DIAGNOSIS — Z6841 Body Mass Index (BMI) 40.0 and over, adult: Secondary | ICD-10-CM

## 2019-11-06 DIAGNOSIS — R600 Localized edema: Secondary | ICD-10-CM

## 2019-11-06 DIAGNOSIS — F17200 Nicotine dependence, unspecified, uncomplicated: Secondary | ICD-10-CM

## 2019-11-06 DIAGNOSIS — M810 Age-related osteoporosis without current pathological fracture: Secondary | ICD-10-CM

## 2019-11-06 DIAGNOSIS — R739 Hyperglycemia, unspecified: Secondary | ICD-10-CM | POA: Diagnosis not present

## 2019-11-06 DIAGNOSIS — Z780 Asymptomatic menopausal state: Secondary | ICD-10-CM

## 2019-11-06 LAB — POCT GLYCOSYLATED HEMOGLOBIN (HGB A1C): Hemoglobin A1C: 6.3 % — AB (ref 4.0–5.6)

## 2019-11-06 MED ORDER — KETOCONAZOLE 2 % EX SHAM: 1.0000 "application " | MEDICATED_SHAMPOO | CUTANEOUS | 0 refills | Status: DC

## 2019-11-06 NOTE — Patient Instructions (Signed)
We will determine next steps and follow up after you get the ultrasound of your leg  Continue to work on walking, elevating leg, avoiding salt, wearing compression socks.  We have your routine follow up in August - keep that appointment.   Peripheral Edema  Peripheral edema is swelling that is caused by a buildup of fluid. Peripheral edema most often affects the lower legs, ankles, and feet. It can also develop in the arms, hands, and face. The area of the body that has peripheral edema will look swollen. It may also feel heavy or warm. Your clothes may start to feel tight. Pressing on the area may make a temporary dent in your skin. You may not be able to move your swollen arm or leg as much as usual. There are many causes of peripheral edema. It can happen because of a complication of other conditions such as congestive heart failure, kidney disease, or a problem with your blood circulation. It also can be a side effect of certain medicines or because of an infection. It often happens to women during pregnancy. Sometimes, the cause is not known. Follow these instructions at home: Managing pain, stiffness, and swelling   Raise (elevate) your legs while you are sitting or lying down.  Move around often to prevent stiffness and to lessen swelling.  Do not sit or stand for long periods of time.  Wear support stockings as told by your health care provider. Medicines  Take over-the-counter and prescription medicines only as told by your health care provider.  Your health care provider may prescribe medicine to help your body get rid of excess water (diuretic). General instructions  Pay attention to any changes in your symptoms.  Follow instructions from your health care provider about limiting salt (sodium) in your diet. Sometimes, eating less salt may reduce swelling.  Moisturize skin daily to help prevent skin from cracking and draining.  Keep all follow-up visits as told by your  health care provider. This is important. Contact a health care provider if you have:  A fever.  Edema that starts suddenly or is getting worse, especially if you are pregnant or have a medical condition.  Swelling in only one leg.  Increased swelling, redness, or pain in one or both of your legs.  Drainage or sores at the area where you have edema. Get help right away if you:  Develop shortness of breath, especially when you are lying down.  Have pain in your chest or abdomen.  Feel weak.  Feel faint. Summary  Peripheral edema is swelling that is caused by a buildup of fluid. Peripheral edema most often affects the lower legs, ankles, and feet.  Move around often to prevent stiffness and to lessen swelling. Do not sit or stand for long periods of time.  Pay attention to any changes in your symptoms.  Contact a health care provider if you have edema that starts suddenly or is getting worse, especially if you are pregnant or have a medical condition.  Get help right away if you develop shortness of breath, especially when lying down. This information is not intended to replace advice given to you by your health care provider. Make sure you discuss any questions you have with your health care provider. Document Revised: 01/17/2018 Document Reviewed: 01/17/2018 Elsevier Patient Education  [place].

## 2019-11-06 NOTE — Progress Notes (Signed)
Patient ID: Susan [name], female    DOB: [birth year], 59 y.o.   MRN: [id number]  PCP: Delsa Grana, PA-C  Chief Complaint  Patient presents with  . Follow-up  . Edema    leg, left foot worst than right    Subjective:   Susan [name] is a 59 y.o. female, presents to clinic with CC of the following:  F/up on LE edema, was seen about 3 weeks ago, conservative management including increase walking, avoid stasis, low salt diet elevating legs, wearing compression socks. Labs done showed normal kidney and liver function, TSH normal, urine microalbumin and creatinine and BNP normal.  HPI from 10/16/2019: Last office visit note and lab results were reviewed today Pt has HTN - has been well controlled with pmhx of intermittent LE edema Here for worsening LE edema to feet and ankles primarily Worse this past month She drove a lot last Sunday and immobile and legs down for a long time swelling was much worse for several days  She is trying to be careful with low salt diet and she drinks ample fluids  On amlodipine for some time and higher dose before - no prior specified amlodipine SE of LE edema Good renal function Not on diuretics -she reports a past allergy from diuretics on the chart there is hydrochlorothiazide and Lasix allergy she states it caused her to have abnormal electrolytes?  She states this occurred more than 10 years ago, reviewed patient's labs going back to 2013-minute do not have record of what happened or what else was occurring around that time  Has been on amlidipine for 4-5+ years, on higher dose in the past, only decreased due to other meds increased, not due to SE  CHF sx?   No orthopnea, CP, palpitations,  OSA compliant with CPAP Breathing worse with her cigarette smoking, still compliant with anoro and albuterol, more DOE using albuterol  Feels breathing is worse with recent weight gain and increased snacking/sweets since starting risperidone - feelsl ike  maybe even her leg swelling has been worse since risperidone May 21- she has worse moods and SI she will call and get sooner f/up with psych - does have an appt in 2 weeks    Urinary sx?  shes drinking a lot and doesn't feel like shes peeing as much as she should with her fluid intake  She returns with some persistent lower extremity edema and she is concerned that left foot is worse than right. She has hx of left hip surgery and she reports she broke her foot last year and was in a boot for half of the year ,she wonders if swelling in left leg is from that, not sure if its new Last office visit she did not have much lower extremity edema at all today she does have some appreciable left foot edema Compression socks do help temporarily but she states the swelling comes right back when she takes them off She reports being "very active" and has tried to put her legs up for about an hour and then be active for about an hour  She is working on getting more active, trying to get where she can do more and breath better, cut back on cigarettes, less than a ppd, denies orthopnea, pnd, weight change- though she is up about 3 lbs Wt Readings from Last 5 Encounters:  11/06/19 238 lb 1.6 oz (108 kg)  10/16/19 235 lb 8 oz (106.8 kg)  08/21/19 224 lb (101.6 kg)  07/24/19 229 lb 15 oz (104.3 kg)  07/16/19 230 lb (104.3 kg)   BMI Readings from Last 5 Encounters:  11/06/19 43.55 kg/m  10/16/19 43.07 kg/m  08/21/19 39.68 kg/m  07/24/19 42.06 kg/m  07/16/19 42.07 kg/m   Blood sugar high with last labs, prediabetes -patient does note that she has worsened eating habits aside from low-salt, and weight gain since her psychiatric medications were changed  Shortness of breath and dyspnea on exertion, multifactorial, she does feel that she has a lot of limitations due to being overweight, she is trying to be active and force herself to take deep breaths she is also using what sounds like an incentive  spirometer at home.  She is compliant with her maintenance inhalers for emphysema/COPD.  She denies orthopnea, PND, cough and she feels that her distant exertion is improving due to her efforts, no worsening dyspnea on exertion with the worsening LLE edema -she denies any redness to her left leg the skin does feel "weird" with a pulling sensation and some discomfort it does feel tight but she has no pain in her calf or behind her knee  She did follow-up with her psychiatrist and she was put on [place] which has been helping her anxiety and depression and moods  Patient reports seeing pulmonology and cardiology within the last year she believes she had PFTs completed recently and had an echo and was told that she does not have any congestive heart failure  Reviewed chart further she did see Dr. Patsey Berthold 05/08/2019,  PFTs were done February 14, 2019 -patient does has a diagnosis of obstructive sleep apnea on CPAP, emphysema Per Dr. Patsey Berthold office visit:  Dyspnea She notes marked improvement with Anoro Multifactorial etiology COPD/obesity/diastolic dysfunction Continue Anoro Continue efforts at weight loss Follow-up 3 months time call sooner if any new respiratory difficulties arise  COPD/emphysema Preserved FEV1 Evidence of increased airway resistance Continue Anoro Smoking cessation  Obstructive sleep apnea on CPAP Schedule with Dr. Halford Chessman for reassessment Has not had follow-up on this issue   Evaluated by Cardiology, Dr. Garen Lah Feb 15, 2019 She was referred for diastolic dysfunction but cardiology does note that this diastolic function was normal with echo done just prior to visit Echo was done 01/21/2019-reviewed results LVEF was 65% and hyperdynamic systolic function, she had no left ventricular hypertrophy and diastolic parameters were normal -he concluded that dyspnea was not cardiac in nature likely due to weight, smoking and combination of COPD, obstructive sleep apnea and  obesity   Patient Active Problem List   Diagnosis Date Noted  . Bipolar disorder, current episode mixed, mild ([place]) 10/29/2019  . PTSD (post-traumatic stress disorder) 09/27/2019  . Bereavement 09/27/2019  . Cocaine use disorder, moderate, in sustained remission ([place]) 09/27/2019  . Status post total hip replacement, left 07/23/2019  . Primary osteoarthritis of left hip 06/14/2019  . Insulin resistance 02/18/2019  . Prediabetes 12/31/2018  . Depression 10/23/2018  . Class 2 severe obesity with serious comorbidity and body mass index (BMI) of 39.0 to 39.9 in adult ([place]) 10/23/2018  . Allergic rhinitis 08/23/2018  . Aortic atherosclerosis ([place]) 02/20/2018  . Centrilobular emphysema ([place]) 02/20/2018  . Dyslipidemia 03/28/2017  . Cocaine use disorder ([place]) 02/27/2017  . Degenerative disc disease at L5-S1 level 09/28/2016  . Tobacco use disorder 04/21/2016  . GERD (gastroesophageal reflux disease) 12/17/2015  . Chronic back pain 10/09/2015  . Post-traumatic osteoarthritis of right knee 09/24/2015  . Class 2 severe obesity with serious comorbidity and body mass  index (BMI) of 38.0 to 38.9 in adult [facility]) 08/06/2015  . Alcohol use disorder, severe, in sustained remission ([place]) 04/13/2015  . Vitamin D deficiency 04/09/2015  . Vitamin B12 deficiency 04/09/2015  . OSA on CPAP 04/09/2015  . Subclinical hyperthyroidism 06/26/2014  . Toxic multinodular goiter 06/26/2014  . Hypertension 06/26/2014  . Bipolar I disorder ([place]) 06/26/2014  . Lumbar radiculitis 10/03/2013  . Diverticulosis 08/24/2013      Current Outpatient Medications:  .  albuterol (VENTOLIN HFA) 108 (90 Base) MCG/ACT inhaler, Inhale 2 puffs into the lungs every 4 (four) hours as needed for wheezing or shortness of breath., Disp: 18 g, Rfl: 2 .  amLODipine (NORVASC) 5 MG tablet, Take 1 tablet (5 mg total) by mouth daily., Disp: 90 tablet, Rfl: 3 .  atenolol (TENORMIN) 25 MG tablet, Take 1 tablet (25 mg total) by mouth  daily., Disp: 90 tablet, Rfl: 3 .  atorvastatin (LIPITOR) 10 MG tablet, Take 1 tablet (10 mg total) by mouth at bedtime., Disp: 90 tablet, Rfl: 3 .  clotrimazole (LOTRIMIN) 1 % cream, Apply 1 application topically 2 (two) times daily., Disp: 30 g, Rfl: 0 .  cyclobenzaprine (FLEXERIL) 5 MG tablet, Take 5 mg by mouth 2 (two) times daily as needed for muscle spasms. , Disp: , Rfl:  .  DULoxetine (CYMBALTA) 60 MG capsule, Take 1 capsule (60 mg total) by mouth 2 (two) times daily., Disp: 180 capsule, Rfl: 3 .  Fluticasone-Umeclidin-Vilant (TRELEGY ELLIPTA) 100-62.5-25 MCG/INH AEPB, Inhale 1 puff into the lungs daily., Disp: 28 each, Rfl: 11 .  gabapentin (NEURONTIN) 300 MG capsule, Take 600 mg by mouth 2 (two) times daily. , Disp: , Rfl:  .  HYDROcodone-acetaminophen (NORCO/VICODIN) 5-325 MG tablet, 1 po qday prn., Disp: , Rfl:  .  HYDROcodone-acetaminophen (NORCO/VICODIN) 5-325 MG tablet, Take 1 tablet by mouth daily as needed., Disp: , Rfl:  .  ketoconazole (NIZORAL) 2 % shampoo, Apply 1 application topically 2 (two) times a week., Disp: 120 mL, Rfl: 0 .  loratadine (CLARITIN) 10 MG tablet, Take 1 tablet (10 mg total) by mouth daily as needed for allergies. (Patient taking differently: Take 10 mg by mouth daily. ), Disp: 90 tablet, Rfl: 3 .  losartan (COZAAR) 100 MG tablet, Take 1 tablet (100 mg total) by mouth daily., Disp: 90 tablet, Rfl: 3 .  lurasidone (LATUDA) 20 MG TABS tablet, Take 1 tablet (20 mg total) by mouth daily with supper., Disp: 30 tablet, Rfl: 1 .  meloxicam (MOBIC) 15 MG tablet, Take by mouth., Disp: , Rfl:  .  omeprazole (PRILOSEC) 40 MG capsule, Take 1 capsule (40 mg total) by mouth daily., Disp: 30 capsule, Rfl: 2 .  umeclidinium-vilanterol (ANORO ELLIPTA) 62.5-25 MCG/INH AEPB, Inhale 1 puff into the lungs daily., Disp: 60 each, Rfl: 5 .  vitamin B-12 (CYANOCOBALAMIN) 1000 MCG tablet, Take 1,000 mcg by mouth daily., Disp: , Rfl:    Allergies  Allergen Reactions  .  Hydrochlorothiazide Other (See Comments)    Electrolyte imbalance  . Lasix [Furosemide] Other (See Comments)    Electrolyte imbalance     Family History  Problem Relation Age of Onset  . Alcohol abuse Father   . Heart attack Father   . Arthritis Mother   . Asthma Mother   . Mental illness Mother   . Thyroid disease Mother   . COPD Mother   . Heart disease Mother   . Congestive Heart Failure Mother   . Alcohol abuse Mother   . Eating disorder  Mother   . Bipolar disorder Mother   . Arthritis Brother   . Mental illness Brother   . Cancer Brother        non-hodkins lymphoma  . Alcohol abuse Sister   . Drug abuse Sister   . Mental illness Sister   . Mental illness Sister   . Fibromyalgia Sister   . Obesity Sister   . Pneumonia Sister   . Depression Sister   . Mental illness Sister   . Alcohol abuse Sister   . Drug abuse Sister   . Drug abuse Son   . Alcohol abuse Son   . Drug abuse Son   . Alcohol abuse Son   . Drug abuse Daughter   . Diabetes Neg Hx   . Stroke Neg Hx   . Breast cancer Neg Hx      Social History   Socioeconomic History  . Marital status: Widowed    Spouse name: Not on file  . Number of children: 3  . Years of education: Not on file  . Highest education level: Some college, no degree  Occupational History  . Occupation: disabled  Tobacco Use  . Smoking status: Current Every Day Smoker    Packs/day: 1.00    Years: 41.00    Pack years: 41.00    Types: Cigarettes  . Smokeless tobacco: Never Used  Vaping Use  . Vaping Use: Former  . Quit date: 07/15/2017  Substance and Sexual Activity  . Alcohol use: No    Alcohol/week: 0.0 standard drinks    Comment: sober since October 2018  . 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 Sexual activity: Not Currently  Other Topics Concern  . Not on file  Social History Narrative  . Not on file   Social Determinants of Health   Financial Resource Strain:   . Difficulty  of Paying Living Expenses:   Food Insecurity:   . Worried About Charity fundraiser in the Last Year:   . Arboriculturist in the Last Year:   Transportation Needs:   . Film/video editor (Medical):   Marland Kitchen Lack of Transportation (Non-Medical):   Physical Activity:   . Days of Exercise per Week:   . Minutes of Exercise per Session:   Stress:   . Feeling of Stress :   Social Connections:   . Frequency of Communication with Friends and Family:   . Frequency of Social Gatherings with Friends and Family:   . Attends Religious Services:   . Active Member of Clubs or Organizations:   . Attends Archivist Meetings:   Marland Kitchen Marital Status:   Intimate Partner Violence:   . Fear of Current or Ex-Partner:   . Emotionally Abused:   Marland Kitchen Physically Abused:   . Sexually Abused:     Chart Review Today: I personally reviewed active problem list, medication list, allergies, family history, social history, health maintenance, notes from last encounter, lab results, imaging with the patient/caregiver today.   Review of Systems  Constitutional: Positive for unexpected weight change. Negative for activity change, appetite change, chills and diaphoresis.  HENT: Negative.   Eyes: Negative.   Respiratory: Positive for shortness of breath. Negative for cough, choking, chest tightness and wheezing.   Cardiovascular: Positive for leg swelling. Negative for chest pain and palpitations.  Gastrointestinal: Negative.   Endocrine: Negative.   Genitourinary: Negative.   Musculoskeletal: Negative.   Skin: Negative.  Allergic/Immunologic: Negative.   Neurological: Negative.   Hematological: Negative.   All other systems reviewed and are negative.  10 Systems reviewed and are negative for acute change except as noted in the HPI.     Objective:   Vitals:   11/06/19 1008  BP: 124/82  Pulse: 85  Resp: 18  Temp: 97.6 F (36.4 C)  TempSrc: Temporal  SpO2: 95%  Weight: 238 lb 1.6 oz (108 kg)    Height: 5\' 2"  (1.575 m)    Body mass index is 43.55 kg/m.  Physical Exam Vitals and nursing note reviewed.  Constitutional:      General: She is not in acute distress.    Appearance: Normal appearance. She is well-developed. She is obese. She is not ill-appearing, toxic-appearing or diaphoretic.     Interventions: Face mask in place.  HENT:     Head: Normocephalic and atraumatic.     Right Ear: External ear normal.     Left Ear: External ear normal.  Eyes:     General: Lids are normal. No scleral icterus.       Right eye: No discharge.        Left eye: No discharge.     Conjunctiva/sclera: Conjunctivae normal.  Neck:     Trachea: Phonation normal. No tracheal deviation.  Cardiovascular:     Rate and Rhythm: Normal rate and regular rhythm.     Pulses: Normal pulses.          Radial pulses are 2+ on the right side and 2+ on the left side.       Posterior tibial pulses are 2+ on the right side and 2+ on the left side.     Heart sounds: Normal heart sounds. No murmur heard.  No friction rub. No gallop.      Comments: Patient endorses lower extremity edema no pitting edema to feet ankles or shins bilaterally Pulmonary:     Effort: Tachypnea present. No accessory muscle usage, respiratory distress or retractions.     Breath sounds: Normal breath sounds. No stridor or decreased air movement. No wheezing, rhonchi or rales.     Comments: Good breath sounds, mild increased work of breathing with mild tachypnea no retractions or distress Chest:     Chest wall: No tenderness.  Abdominal:     General: Bowel sounds are normal. There is no distension.     Palpations: Abdomen is soft.     Tenderness: There is no abdominal tenderness. There is no guarding or rebound.  Musculoskeletal:     Cervical back: Normal range of motion and neck supple.     Right lower leg: No edema.     Left lower leg: No edema.  Lymphadenopathy:     Cervical: No cervical adenopathy.  Skin:    General: Skin is  warm and dry.     Capillary Refill: Capillary refill takes less than 2 seconds.     Coloration: Skin is not jaundiced or pale.     Findings: No rash.  Neurological:     Mental Status: She is alert and oriented to person, place, and time.     Motor: No abnormal muscle tone.     Gait: Gait normal.  Psychiatric:        Speech: Speech normal.        Behavior: Behavior normal.      Results for orders placed or performed in visit on 10/16/19  COMPLETE METABOLIC PANEL WITH GFR  Result Value Ref Range  Glucose, Bld 179 (H) 65 - 99 mg/dL   BUN 13 7 - 25 mg/dL   Creat 0.92 0.50 - 1.05 mg/dL   GFR, Est Non African American 69 > OR = 60 mL/min/1.64m2   GFR, Est African American 80 > OR = 60 mL/min/1.57m2   BUN/Creatinine Ratio NOT APPLICABLE 6 - 22 (calc)   Sodium 138 135 - 146 mmol/L   Potassium 4.0 3.5 - 5.3 mmol/L   Chloride 106 98 - 110 mmol/L   CO2 22 20 - 32 mmol/L   Calcium 9.0 8.6 - 10.4 mg/dL   Total Protein 6.2 6.1 - 8.1 g/dL   Albumin 4.1 3.6 - 5.1 g/dL   Globulin 2.1 1.9 - 3.7 g/dL (calc)   AG Ratio 2.0 1.0 - 2.5 (calc)   Total Bilirubin 0.3 0.2 - 1.2 mg/dL   Alkaline phosphatase (APISO) 90 37 - 153 U/L   AST 15 10 - 35 U/L   ALT 15 6 - 29 U/L  TSH  Result Value Ref Range   TSH 1.09 0.40 - 4.50 mIU/L  Microalbumin / creatinine urine ratio  Result Value Ref Range   Creatinine, Urine 213 20 - 275 mg/dL   Microalb, Ur 1.2 mg/dL   Microalb Creat Ratio 6 <30 mcg/mg creat  Brain natriuretic peptide  Result Value Ref Range   Brain Natriuretic Peptide 24 <100 pg/mL        Assessment & Plan:      ICD-10-CM   1. Lower extremity edema  R60.0 US Venous Img Lower Unilateral Left   Some continued edema with left>R, edema is still mild to moderate continue conservative management, r/o DVT, but recent surgery to left hip/foot likely contributory  Encouraged her to wear compression stockings more often, we can have vascular specialist evaluate her  Unlikely to be CHF with  recent unremarkable cardiac work-up and normal echo    2. Rash  R21 ketoconazole (NIZORAL) 2 % shampoo   Rash to scalp, patient has not seen dermatology yet she would like a refill on the ketoconazole shampoo   3. Shortness of breath  R06.02    Multifactorial, encouraged to continue working on smoking cessation, she has current good adherence to inhaler use, encouraged to continue exercising and working on deconditioned state,  weight loss and CPAP compliance would also help    4. Hyperglycemia  R73.9 POCT HgB A1C   hx of prediabetes, last labs show elevated blood sugar though her last A1c was in normal range, recheck A1c today with hyperglycemia and sig weight increase -med side effects and recent surgeries and immobility over the last year    5. Class 3 severe obesity with serious comorbidity and body mass index (BMI) of 40.0 to 44.9 in adult, unspecified obesity type ([place])  E66.01    Z68.41    Weight increased, encouraged her to reduce calorie intake carbs sweets and junk food and increase exercise, f/up nutritionist, was previously seeing weight mgmt  Earlier this month I did refer nutritional counseling-we will have staff help follow-up on that referral She does have the option to return to medical weight management since they have nutrition both into the programs and office visits   6. Tobacco use disorder  F17.200    Discussed smoking cessation today, tobacco counseling was done  7. Osteoporosis, unspecified osteoporosis type, unspecified pathological fracture presence  M81.0    Per [facility] records, multiple risk factors, cannot find DEXA report  8. Postmenopausal estrogen deficiency  Z78.0 DG Bone Density  Bilateral salpingectomy and oophorectomy about 8 years ago, history of frequent steroid use, due for DEXA   Due for PAP - last 2017  Health Maintenance reviewed - Health Maintenance  Topic Date Due  . MAMMOGRAM  03/16/2018  . PAP SMEAR-Modifier  07/08/2018  . COLONOSCOPY   08/20/2020 (Originally 10/17/2018)  . INFLUENZA VACCINE  12/08/2019  . TETANUS/TDAP  03/09/2022  . COVID-19 Vaccine  Completed  . Hepatitis C Screening  Completed  . HIV Screening  Completed     Return for (pt has next routine f/up appt scheduled already).    Delsa Grana, PA-C 11/06/19 10:21 AM

## 2019-11-08 ENCOUNTER — Encounter: Payer: Self-pay | Admitting: Psychiatry

## 2019-11-08 ENCOUNTER — Telehealth (INDEPENDENT_AMBULATORY_CARE_PROVIDER_SITE_OTHER): Payer: Medicare HMO | Admitting: Psychiatry

## 2019-11-08 ENCOUNTER — Other Ambulatory Visit: Payer: Self-pay

## 2019-11-08 DIAGNOSIS — F4310 Post-traumatic stress disorder, unspecified: Secondary | ICD-10-CM

## 2019-11-08 DIAGNOSIS — F1421 Cocaine dependence, in remission: Secondary | ICD-10-CM

## 2019-11-08 DIAGNOSIS — Z634 Disappearance and death of family member: Secondary | ICD-10-CM

## 2019-11-08 DIAGNOSIS — F1021 Alcohol dependence, in remission: Secondary | ICD-10-CM

## 2019-11-08 DIAGNOSIS — F17200 Nicotine dependence, unspecified, uncomplicated: Secondary | ICD-10-CM | POA: Diagnosis not present

## 2019-11-08 DIAGNOSIS — F3161 Bipolar disorder, current episode mixed, mild: Secondary | ICD-10-CM

## 2019-11-08 MED ORDER — LURASIDONE HCL 40 MG PO TABS: 40.0000 mg | ORAL_TABLET | Freq: Every day | ORAL | 1 refills | Status: DC

## 2019-11-08 NOTE — Progress Notes (Signed)
Provider Location : [facility] Patient Location : Home  Virtual Visit via Video Note  I connected with [place] on 11/08/19 at  8:30 AM EDT by a video enabled telemedicine application and verified that I am speaking with the correct person using two identifiers.   I discussed the limitations of evaluation and management by telemedicine and the availability of in person appointments. The patient expressed understanding and agreed to proceed.   I discussed the assessment and treatment plan with the patient. The patient was provided an opportunity to ask questions and all were answered. The patient agreed with the plan and demonstrated an understanding of the instructions.   The patient was advised to call back or seek an in-person evaluation if the symptoms worsen or if the condition fails to improve as anticipated.  [facility] MD OP Progress Note  11/08/2019 1:57 PM Susan [name]  MRN:  [id number]  Chief Complaint:  Chief Complaint    Follow-up     HPI: Susan [name] is a 59 year old Caucasian female, widowed, on disability, lives in [place], has a history of bipolar disorder, alcohol and cocaine use disorder currently in remission, obstructive sleep apnea on CPAP, hypertension, degenerative disc disease, status post total hip replacement, osteoarthritis, hepatitis C was evaluated by telemedicine today.  Patient today reports she was doing better on the [place] until yesterday.  She had a bad day yesterday when she felt sad, withdrawn and did not want to be bothered by anyone.  She reports her son who passed away was on her mind a lot yesterday.  She reports she currently is compliant on the [place].  Denies side effects.  She reports sleep is better.  She continues to follow-up with her therapist.  She reports therapy sessions as beneficial.  She continues to be very active in [name].  Patient denies any suicidality, homicidality or perceptual disturbances.  Patient reports  she has lower extremity-left-sided swelling.  She has upcoming appointment with her provider for the same.  Patient denies any other concerns today.  Visit Diagnosis:    ICD-10-CM   1. Bipolar disorder, current episode mixed, mild (HCC)  F31.61 lurasidone (LATUDA) 40 MG TABS tablet  2. PTSD (post-traumatic stress disorder)  F43.10 lurasidone (LATUDA) 40 MG TABS tablet  3. Bereavement  Z63.4   4. Tobacco use disorder  F17.200   5. Alcohol use disorder, severe, in sustained remission ([place])  F10.21   6. Cocaine use disorder, moderate, in sustained remission ([place])  F14.21     Past Psychiatric History: I have reviewed past psychiatric history from my progress note on 09/27/2019.  Past trials of Seroquel, Cymbalta, Abilify, risperidone.  Past Medical History:  Past Medical History:  Diagnosis Date  . ADD (attention deficit disorder)   . Alcohol abuse   . Anemia   . Anxiety   . Aortic atherosclerosis ([place]) 02/20/2018   Chest CT Sept 2019  . Arthritis    rheumatoid arthritis  . Asthma   . Bipolar disorder ([place])   . Centrilobular emphysema ([place]) 02/20/2018   Chest CT Sept 2019  . Constipation   . Degenerative disc disease at L5-S1 level 09/28/2016   See ortho note May 2018  . Depression    bipolar, hx of suicide attempt  . Drug use   . Dyspnea    with exertion  . GERD (gastroesophageal reflux disease)   . H/O suicide attempt    slit wrists  . Hepatitis C 06/26/2014  . Hip pain   .  History of alcohol abuse   . History of diverticulitis 2013  . History of hepatitis C    HEP "C"--three years ago  . History of MRSA infection 2013  . HLD (hyperlipidemia)   . Hypertension   . Hypothyroidism   . Incisional hernia 11/08/2012  . Knee pain   . 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 Multinodular thyroid   . OSA (obstructive sleep apnea)    managed by Dr. Manuella Ghazi  . Osteoporosis   . Post-traumatic osteoarthritis of right knee 09/24/2015  . Prediabetes   . Recurrent ventral hernia 11/08/2012  . Sleep  apnea     Dx 3 years ago. Use C-PAP  . 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 Thyroid disease    Goiter  . Vitamin B12 deficiency   . Vitamin D deficiency disease     Past Surgical History:  Procedure Laterality Date  . BILATERAL SALPINGOOPHORECTOMY     due to abnormal mass  . BREAST BIOPSY Left    neg  . BREAST SURGERY Left 20 yrs ago  . CESAREAN SECTION    . COLONOSCOPY    . COLONOSCOPY WITH PROPOFOL N/A 10/16/2017   Procedure: COLONOSCOPY WITH PROPOFOL;  Surgeon: Jonathon Bellows, MD;  Location: [facility] ENDOSCOPY;  Service: Gastroenterology;  Laterality: N/A;  . HERNIA REPAIR  06/2011, July 2014   Ventral wall repair with Physiomesh  . HERNIA REPAIR     2nd.vental wall repair  . JOINT REPLACEMENT Right    knee  . TONSILLECTOMY    . TOTAL HIP ARTHROPLASTY Left 07/23/2019   Procedure: TOTAL HIP ARTHROPLASTY;  Surgeon: Corky Mull, MD;  Location: [facility] ORS;  Service: Orthopedics;  Laterality: Left;  . TOTAL KNEE ARTHROPLASTY Right 05/10/2016   Procedure: TOTAL KNEE ARTHROPLASTY;  Surgeon: Corky Mull, MD;  Location: [facility] ORS;  Service: Orthopedics;  Laterality: Right;  . TUBAL LIGATION      Family Psychiatric History: I have reviewed family psychiatric history from my progress note on 09/27/2019  Family History:  Family History  Problem Relation Age of Onset  . Alcohol abuse Father   . Heart attack Father   . Arthritis Mother   . Asthma Mother   . Mental illness Mother   . Thyroid disease Mother   . COPD Mother   . Heart disease Mother   . Congestive Heart Failure Mother   . Alcohol abuse Mother   . Eating disorder Mother   . Bipolar disorder Mother   . Arthritis Brother   . Mental illness Brother   . Cancer Brother        non-hodkins lymphoma  . Alcohol abuse Sister   . Drug abuse Sister   . Mental illness Sister   . Mental illness Sister   . Fibromyalgia Sister   . Obesity Sister   . Pneumonia Sister   . Depression Sister   . Mental  illness Sister   . Alcohol abuse Sister   . Drug abuse Sister   . Drug abuse Son   . Alcohol abuse Son   . Drug abuse Son   . Alcohol abuse Son   . Drug abuse Daughter   . Diabetes Neg Hx   . Stroke Neg Hx   . Breast cancer Neg Hx     Social History: I have reviewed social history from my progress note on 09/27/2019 Social History   Socioeconomic History  . Marital status: Widowed    Spouse name: Not on file  .  Number of children: 3  . Years of education: Not on file  . Highest education level: Some college, no degree  Occupational History  . Occupation: disabled  Tobacco Use  . Smoking status: Current Every Day Smoker    Packs/day: 1.00    Years: 41.00    Pack years: 41.00    Types: Cigarettes  . Smokeless tobacco: Never Used  Vaping Use  . Vaping Use: Former  . Quit date: 07/15/2017  Substance and Sexual Activity  . Alcohol use: No    Alcohol/week: 0.0 standard drinks    Comment: sober since October 2018  . 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 Sexual activity: Not Currently  Other Topics Concern  . Not on file  Social History Narrative  . Not on file   Social Determinants of Health   Financial Resource Strain:   . Difficulty of Paying Living Expenses:   Food Insecurity:   . Worried About Charity fundraiser in the Last Year:   . Arboriculturist in the Last Year:   Transportation Needs:   . Film/video editor (Medical):   Marland Kitchen Lack of Transportation (Non-Medical):   Physical Activity:   . Days of Exercise per Week:   . Minutes of Exercise per Session:   Stress:   . Feeling of Stress :   Social Connections:   . Frequency of Communication with Friends and Family:   . Frequency of Social Gatherings with Friends and Family:   . Attends Religious Services:   . Active Member of Clubs or Organizations:   . Attends Archivist Meetings:   Marland Kitchen Marital Status:     Allergies:  Allergies  Allergen Reactions  .  Hydrochlorothiazide Other (See Comments)    Electrolyte imbalance  . Lasix [Furosemide] Other (See Comments)    Electrolyte imbalance    Metabolic Disorder Labs: Lab Results  Component Value Date   HGBA1C 6.3 (A) 11/06/2019   MPG 108 04/08/2019   MPG 103 08/16/2017   No results found for: PROLACTIN Lab Results  Component Value Date   CHOL 143 08/21/2019   TRIG 83 08/21/2019   HDL 47 (L) 08/21/2019   CHOLHDL 3.0 08/21/2019   VLDL 27 10/14/2016   LDLCALC 79 08/21/2019   LDLCALC 79 04/08/2019   Lab Results  Component Value Date   TSH 1.09 10/16/2019   TSH 1.430 07/26/2018    Therapeutic Level Labs: No results found for: LITHIUM No results found for: VALPROATE No components found for:  CBMZ  Current Medications: Current Outpatient Medications  Medication Sig Dispense Refill  . albuterol (VENTOLIN HFA) 108 (90 Base) MCG/ACT inhaler Inhale 2 puffs into the lungs every 4 (four) hours as needed for wheezing or shortness of breath. 18 g 2  . amLODipine (NORVASC) 5 MG tablet Take 1 tablet (5 mg total) by mouth daily. 90 tablet 3  . atenolol (TENORMIN) 25 MG tablet Take 1 tablet (25 mg total) by mouth daily. 90 tablet 3  . atorvastatin (LIPITOR) 10 MG tablet Take 1 tablet (10 mg total) by mouth at bedtime. 90 tablet 3  . clotrimazole (LOTRIMIN) 1 % cream Apply 1 application topically 2 (two) times daily. 30 g 0  . cyclobenzaprine (FLEXERIL) 5 MG tablet Take 5 mg by mouth 2 (two) times daily as needed for muscle spasms.     . DULoxetine (CYMBALTA) 60 MG capsule Take 1 capsule (60 mg total) by mouth  2 (two) times daily. 180 capsule 3  . Fluticasone-Umeclidin-Vilant (TRELEGY ELLIPTA) 100-62.5-25 MCG/INH AEPB Inhale 1 puff into the lungs daily. 28 each 11  . gabapentin (NEURONTIN) 300 MG capsule Take 600 mg by mouth 2 (two) times daily.     Marland Kitchen HYDROcodone-acetaminophen (NORCO/VICODIN) 5-325 MG tablet 1 po qday prn.    Marland Kitchen HYDROcodone-acetaminophen (NORCO/VICODIN) 5-325 MG tablet Take 1  tablet by mouth daily as needed.    Marland Kitchen ketoconazole (NIZORAL) 2 % shampoo Apply 1 application topically 2 (two) times a week. 120 mL 0  . loratadine (CLARITIN) 10 MG tablet Take 1 tablet (10 mg total) by mouth daily as needed for allergies. (Patient taking differently: Take 10 mg by mouth daily. ) 90 tablet 3  . losartan (COZAAR) 100 MG tablet Take 1 tablet (100 mg total) by mouth daily. 90 tablet 3  . lurasidone (LATUDA) 40 MG TABS tablet Take 1 tablet (40 mg total) by mouth daily with supper. 30 tablet 1  . meloxicam (MOBIC) 15 MG tablet Take by mouth.    Marland Kitchen omeprazole (PRILOSEC) 40 MG capsule Take 1 capsule (40 mg total) by mouth daily. 30 capsule 2  . umeclidinium-vilanterol (ANORO ELLIPTA) 62.5-25 MCG/INH AEPB Inhale 1 puff into the lungs daily. 60 each 5  . vitamin B-12 (CYANOCOBALAMIN) 1000 MCG tablet Take 1,000 mcg by mouth daily.     No current facility-administered medications for this visit.     Musculoskeletal: Strength & Muscle Tone: UTA Gait & Station: normal Patient leans: N/A  Psychiatric Specialty Exam: Review of Systems  Cardiovascular: Positive for leg swelling.  Psychiatric/Behavioral: Positive for dysphoric mood and sleep disturbance. The patient is nervous/anxious.   All other systems reviewed and are negative.   There were no vitals taken for this visit.There is no height or weight on file to calculate BMI.  General Appearance: Casual  Eye Contact:  Fair  Speech:  Clear and Coherent  Volume:  Normal  Mood:  Depressed and Dysphoric, anxious - improving  Affect:  Congruent  Thought Process:  Goal Directed and Descriptions of Associations: Intact  Orientation:  Full (Time, Place, and Person)  Thought Content: Logical   Suicidal Thoughts:  No  Homicidal Thoughts:  No  Memory:  Immediate;   Fair Recent;   Fair Remote;   Fair  Judgement:  Fair  Insight:  Fair  Psychomotor Activity:  Normal  Concentration:  Concentration: Fair and Attention Span: Fair   Recall:  [employer] of Knowledge: Fair  Language: Fair  Akathisia:  No  Handed:  Right  AIMS (if indicated): UTA  Assets:  Communication Skills Desire for Improvement Housing Social Support  ADL's:  Intact  Cognition: WNL  Sleep:  Improving   Screenings: GAD-7     Office Visit from 11/06/2019 in [facility] Office Visit from 09/06/2018 in [facility]  Total GAD-7 Score 21 14    PHQ2-9     Office Visit from 11/06/2019 in [facility] Office Visit from 10/16/2019 in [facility] Office Visit from 08/21/2019 in [facility] Office Visit from 04/08/2019 in [facility] Office Visit from 01/04/2019 in [facility]  PHQ-2 Total Score 6 6 6 1 3   PHQ-9 Total Score 13 23 13 1 9        Assessment and Plan: Susan [name] is a 59 year old Caucasian female who has a history of bipolar disorder, degenerative disc disease, status post hip replacement, essential  hypertension, OSA on CPAP, hepatitis C was evaluated by telemedicine today.  She is biologically predisposed given her family history of mental health problems, her own history of substance abuse problems and trauma.  Patient with psychosocial stressors of recent deaths in the family, children who struggle with substance abuse problem and the current pandemic and the loss of her dog and her own medical problems.  Patient is currently making progress on the [place].  Plan as noted below.  Plan Bipolar disorder current episode mixed-improving Increase Latuda to 40 mg p.o. daily with supper Cymbalta as prescribed  PTSD-improving Cymbalta as prescribed Continue CBT with therapist Mr. Debbe Bales.  Bereavement-improving Continue grief counseling  Tobacco use disorder-improving Provided counseling  Alcohol and cocaine use disorder in remission She continues to be active in [name].  Follow-up in  clinic in 6 to 8 weeks or sooner if needed.  I have spent atleast 20 minutes non face to face with patient today. More than 50 % of the time was spent for preparing to see the patient ( e.g., review of test, records ), ordering medications and test ,psychoeducation and supportive psychotherapy and care coordination,as well as documenting clinical information in electronic health record. This note was generated in part or whole with voice recognition software. Voice recognition is usually quite accurate but there are transcription errors that can and very often do occur. I apologize for any typographical errors that were not detected and corrected.        Ursula Alert, MD 11/08/2019, 1:57 PM

## 2019-11-14 ENCOUNTER — Other Ambulatory Visit: Payer: Self-pay

## 2019-11-14 ENCOUNTER — Ambulatory Visit
Admission: RE | Admit: 2019-11-14 | Discharge: 2019-11-14 | Disposition: A | Discharge status: Home / Self Care | Payer: Medicare HMO | Source: Ambulatory Visit | Attending: Student | Admitting: Student

## 2019-11-14 ENCOUNTER — Ambulatory Visit: Payer: Medicare HMO | Admitting: Psychology

## 2019-11-14 ENCOUNTER — Other Ambulatory Visit (HOSPITAL_COMMUNITY): Payer: Self-pay | Admitting: Student

## 2019-11-14 ENCOUNTER — Other Ambulatory Visit: Payer: Self-pay | Admitting: Student

## 2019-11-14 DIAGNOSIS — M7989 Other specified soft tissue disorders: Secondary | ICD-10-CM

## 2019-11-18 ENCOUNTER — Other Ambulatory Visit: Payer: Self-pay

## 2019-11-18 ENCOUNTER — Ambulatory Visit (INDEPENDENT_AMBULATORY_CARE_PROVIDER_SITE_OTHER): Payer: Medicare HMO | Admitting: Dermatology

## 2019-11-18 DIAGNOSIS — L739 Follicular disorder, unspecified: Secondary | ICD-10-CM

## 2019-11-18 DIAGNOSIS — L738 Other specified follicular disorders: Secondary | ICD-10-CM

## 2019-11-18 MED ORDER — KETOCONAZOLE 2 % EX SHAM: MEDICATED_SHAMPOO | CUTANEOUS | 3 refills | Status: DC

## 2019-11-18 MED ORDER — CLINDAMYCIN PHOSPHATE 1 % EX SOLN: CUTANEOUS | 3 refills | Status: DC

## 2019-11-18 NOTE — Progress Notes (Signed)
   New Patient Visit  Subjective  Susan [name] is a 59 y.o. female who presents for the following: sores (of the scalp that start off as bumps and she picks at them. Her PCP prescribed Ketoconazole shampoo that she uses three days per week. She isn't sure if the shampoo has been helpful).  The following portions of the chart were reviewed this encounter and updated as appropriate:  Tobacco  Allergies  Meds  Problems  Med Hx  Surg Hx  Fam Hx     Review of Systems:  No other skin or systemic complaints except as noted in HPI or Assessment and Plan.  Objective  Well appearing patient in no apparent distress; mood and affect are within normal limits.  A focused examination was performed including the scalp. Relevant physical exam findings are noted in the Assessment and Plan.  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  Recommend CLN shampoo on the days she doesn't use the Ketoconazole shampoo.  Start Clindamycin solution QD.  Consider Doxycycline and or topical Ivermectin if not improved at follow up appointment.  clindamycin (CLEOCIN T) 1 % external solution - Scalp  ketoconazole (NIZORAL) 2 % shampoo - Scalp  Return in about 2 months (around 01/19/2020).  Luther Redo, CMA, am acting as scribe for Sarina Ser, MD .  Documentation: I have reviewed the above documentation for accuracy and completeness, and I agree with the above.  Sarina Ser, MD

## 2019-11-19 ENCOUNTER — Encounter: Payer: Self-pay | Admitting: Dermatology

## 2019-11-20 ENCOUNTER — Ambulatory Visit: Payer: Medicare HMO | Admitting: Psychology

## 2019-11-21 ENCOUNTER — Ambulatory Visit: Payer: Medicare HMO | Admitting: Psychology

## 2019-11-29 ENCOUNTER — Other Ambulatory Visit: Payer: Self-pay

## 2019-11-29 DIAGNOSIS — K219 Gastro-esophageal reflux disease without esophagitis: Secondary | ICD-10-CM

## 2019-11-29 MED ORDER — OMEPRAZOLE 40 MG PO CPDR: 40.0000 mg | DELAYED_RELEASE_CAPSULE | Freq: Every day | ORAL | 3 refills | Status: DC

## 2019-12-05 ENCOUNTER — Ambulatory Visit (INDEPENDENT_AMBULATORY_CARE_PROVIDER_SITE_OTHER): Payer: Medicare HMO | Admitting: Adult Health

## 2019-12-05 ENCOUNTER — Other Ambulatory Visit: Payer: Self-pay

## 2019-12-05 ENCOUNTER — Encounter: Payer: Self-pay | Admitting: Adult Health

## 2019-12-05 ENCOUNTER — Ambulatory Visit: Payer: Medicare HMO | Admitting: Psychology

## 2019-12-05 VITALS — BP 142/82 | HR 73 | Temp 98.0°F | Resp 16 | Ht 62.0 in | Wt 241.2 lb

## 2019-12-05 DIAGNOSIS — Z1389 Encounter for screening for other disorder: Secondary | ICD-10-CM

## 2019-12-05 NOTE — Progress Notes (Signed)
    Patient answered no to all Covid Screening questions and when brought to room reported shortness of breath and cough for 3 days, She reported she was fully vaccinated so did not feel those questions pertained to her. She was advised of need to go to car for virtual evaluation establish care appointment. Patient declined and reported she would go to urgent care and call back for new patient appointment.  No exam/ patient contact  Made by provider.   Above per covid 19 policy in office.

## 2019-12-06 ENCOUNTER — Telehealth: Payer: Self-pay

## 2019-12-06 NOTE — Telephone Encounter (Signed)
Copied from [place] [id number]. Topic: Appointment Scheduling - Scheduling Inquiry for Clinic >> Dec 06, 2019  8:50 AM Scherrie Gerlach wrote: Reason for CRM: pt has appt 8/9 w/ Sharyn Lull.  She went for covid test yesterday and it was negative. However with her sx, it wil only allow me to do virtual visit. Please advise pt closer to appt date if she can come in office.

## 2019-12-10 NOTE — Telephone Encounter (Signed)
Earliest she can come in office is 8/11 and her cough/ SOB and any other covid symptom must be resolved at time per office protocol. Thanks

## 2019-12-11 NOTE — Telephone Encounter (Signed)
Pt advised as directed below.  Apt rescheduled to 12/20/2019.  Pt states the shortness of breath and cough started when her inhaler was changed for Anoro to Trelegy.  She would like to discuss going back on Anoro.  She also thinks her BP medicine is causing her foot to swell.   Thanks,   -Mickel Baas

## 2019-12-16 ENCOUNTER — Telehealth: Payer: Medicare HMO | Admitting: Adult Health

## 2019-12-20 ENCOUNTER — Ambulatory Visit: Payer: Medicare Other | Admitting: Adult Health

## 2019-12-20 ENCOUNTER — Ambulatory Visit: Payer: Medicare HMO | Admitting: Family Medicine

## 2019-12-25 ENCOUNTER — Ambulatory Visit (INDEPENDENT_AMBULATORY_CARE_PROVIDER_SITE_OTHER): Payer: Medicare Other | Admitting: Physician Assistant

## 2019-12-25 ENCOUNTER — Telehealth: Payer: Self-pay | Admitting: Adult Health

## 2019-12-25 ENCOUNTER — Ambulatory Visit: Payer: Medicare Other | Admitting: Physician Assistant

## 2019-12-25 ENCOUNTER — Encounter: Payer: Self-pay | Admitting: Physician Assistant

## 2019-12-25 ENCOUNTER — Other Ambulatory Visit: Payer: Self-pay

## 2019-12-25 ENCOUNTER — Telehealth: Payer: Self-pay

## 2019-12-25 VITALS — BP 144/88 | HR 66 | Temp 98.3°F | Wt 240.8 lb

## 2019-12-25 DIAGNOSIS — I10 Essential (primary) hypertension: Secondary | ICD-10-CM

## 2019-12-25 DIAGNOSIS — R7303 Prediabetes: Secondary | ICD-10-CM

## 2019-12-25 MED ORDER — HYDROCHLOROTHIAZIDE 25 MG PO TABS: 25.0000 mg | ORAL_TABLET | Freq: Every day | ORAL | 0 refills | Status: DC

## 2019-12-25 NOTE — Progress Notes (Signed)
Established patient visit   Patient: Susan [name]   DOB: 05-Jul-1960   59 y.o. Female  MRN: [id number] Visit Date: 12/25/2019  Today's healthcare provider: Trinna Post, PA-C   Chief Complaint  Patient presents with  . Foot Swelling  I,Tori Cupps M Lavetta Geier,acting as a scribe for Trinna Post, PA-C.,have documented all relevant documentation on the behalf of Trinna Post, PA-C,as directed by  Trinna Post, PA-C while in the presence of Trinna Post, PA-C.  Subjective    HPI   Foot Swelling Patient presents today for left foot swelling for about 2 months now. She denies any pain or injury to the foot at the moment. Patient reports she did started Latuda 40 MG a month ago. She states she had hip surgery in 2019-08-17.  Hypertension, follow-up  BP Readings from Last 3 Encounters:  12/25/19 (!) 144/88  12/05/19 (!) 142/82  11/06/19 124/82   Wt Readings from Last 3 Encounters:  12/25/19 240 lb 12.8 oz (109.2 kg)  12/05/19 (!) 241 lb 3.2 oz (109.4 kg)  11/06/19 238 lb 1.6 oz (108 kg)     She was last seen for hypertension 3 months ago.  BP at that visit was elevated. Management since that visit includes continue amlodipine 5 mg daily, atenlol 25 mg daily and losartan 100 mg daily. She has had left ankle swelling for several months. She had left hip arthroplasty in Aug 17, 2019. She underwent left leg [name] 11/14/2019 which was negative for DVT. She has a listed side effect to HCTZ as electrolyte disturbance in the chart but she reports this was over 10 years ago and she took it at the same time with Lasix.   She reports excellent compliance with treatment. She is having side effects. Left foot swelling. She is following a unhealthy diet. She is not exercising. She does not smoke.  Use of agents associated with hypertension: none.   Outside blood pressures are not checked. Symptoms: No chest pain No chest pressure  No palpitations No syncope  No dyspnea No  orthopnea  No paroxysmal nocturnal dyspnea Yes lower extremity edema   Pertinent labs: Lab Results  Component Value Date   CHOL 143 08/21/2019   HDL 47 (L) 08/21/2019   LDLCALC 79 08/21/2019   TRIG 83 08/21/2019   CHOLHDL 3.0 08/21/2019   Lab Results  Component Value Date   NA 138 10/16/2019   K 4.0 10/16/2019   CREATININE 0.92 10/16/2019   GFRNONAA 69 10/16/2019   GFRAA 80 10/16/2019   GLUCOSE 179 (H) 10/16/2019     The 10-year ASCVD risk score Mikey Bussing DC Jr., et al., 08/17/11) is: 8.4%   Morbid Obesity: Wants to be referred to bariatric surgeon in [place], [place]. Reports her son died in 17-Aug-2019. She was following healthy weight and wellness in [place], [place] but this is too far to drive. 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   Wt Readings from Last 3 Encounters:  12/25/19 240 lb 12.8 oz (109.2 kg)  12/05/19 (!) 241 lb 3.2 oz (109.4 kg)  11/06/19 238 lb 1.6 oz (108 kg)    ---------------------------------------------------------------------------------------------------      Medications: Outpatient Medications Prior to Visit  Medication Sig  . albuterol (VENTOLIN HFA) 108 (90 Base) MCG/ACT inhaler Inhale 2 puffs into the lungs every 4 (four) hours as needed for wheezing or shortness of breath.  Marland Kitchen atenolol (TENORMIN) 25 MG tablet Take 1 tablet (  25 mg total) by mouth daily.  Marland Kitchen atorvastatin (LIPITOR) 10 MG tablet Take 1 tablet (10 mg total) by mouth at bedtime.  . clindamycin (CLEOCIN T) 1 % external solution Apply to aa's scalp QD PRN  . clotrimazole (LOTRIMIN) 1 % cream Apply 1 application topically 2 (two) times daily.  . cyclobenzaprine (FLEXERIL) 5 MG tablet Take 5 mg by mouth 2 (two) times daily as needed for muscle spasms.   . DULoxetine (CYMBALTA) 60 MG capsule Take 1 capsule (60 mg total) by mouth 2 (two) times daily.  . Fluticasone-Umeclidin-Vilant (TRELEGY ELLIPTA) 100-62.5-25 MCG/INH AEPB Inhale 1 puff into the lungs  daily.  Marland Kitchen gabapentin (NEURONTIN) 300 MG capsule Take 600 mg by mouth 2 (two) times daily.   Marland Kitchen HYDROcodone-acetaminophen (NORCO/VICODIN) 5-325 MG tablet Take 1 tablet by mouth daily as needed.  Marland Kitchen ketoconazole (NIZORAL) 2 % shampoo Apply 1 application topically 2 (two) times a week.  Marland Kitchen ketoconazole (NIZORAL) 2 % shampoo Shampoo into the scalp let sit 10 minutes then wash out. Use 3d/wk.  Marland Kitchen loratadine (CLARITIN) 10 MG tablet Take 1 tablet (10 mg total) by mouth daily as needed for allergies. (Patient taking differently: Take 10 mg by mouth daily. )  . losartan (COZAAR) 100 MG tablet Take 1 tablet (100 mg total) by mouth daily.  Marland Kitchen lurasidone (LATUDA) 40 MG TABS tablet Take 1 tablet (40 mg total) by mouth daily with supper.  . meloxicam (MOBIC) 15 MG tablet Take by mouth.  Marland Kitchen omeprazole (PRILOSEC) 40 MG capsule Take 1 capsule (40 mg total) by mouth daily.  Marland Kitchen umeclidinium-vilanterol (ANORO ELLIPTA) 62.5-25 MCG/INH AEPB Inhale 1 puff into the lungs daily.  . [DISCONTINUED] amLODipine (NORVASC) 5 MG tablet Take 1 tablet (5 mg total) by mouth daily.  . vitamin B-12 (CYANOCOBALAMIN) 1000 MCG tablet Take 1,000 mcg by mouth daily. (Patient not taking: Reported on 12/25/2019)  . [DISCONTINUED] HYDROcodone-acetaminophen (NORCO/VICODIN) 5-325 MG tablet 1 po qday prn. (Patient not taking: Reported on 12/25/2019)   No facility-administered medications prior to visit.    Review of Systems  Constitutional: Negative.   Cardiovascular: Negative.   Musculoskeletal: Negative.   Hematological: Negative.        Objective    BP (!) 144/88 (BP Location: Left Arm, Patient Position: Sitting, Cuff Size: Normal)   Pulse 66   Temp 98.3 F (36.8 C) (Oral)   Wt 240 lb 12.8 oz (109.2 kg)   SpO2 96%   BMI 44.04 kg/m     Physical Exam Constitutional:      Appearance: Normal appearance. She is obese.  Cardiovascular:     Rate and Rhythm: Normal rate and regular rhythm.     Heart sounds: Normal heart sounds.    Pulmonary:     Effort: Pulmonary effort is normal.     Breath sounds: Normal breath sounds.  Musculoskeletal:     Right lower leg: Edema present.     Left lower leg: Edema present.     Comments: Bilateral lower extremity edema, left > right. Pedal edema in left foot particularly pronounced.   Skin:    General: Skin is warm and dry.  Neurological:     General: No focal deficit present.     Mental Status: She is alert and oriented to person, place, and time. Mental status is at baseline.  Psychiatric:        Mood and Affect: Mood normal.        Behavior: Behavior normal.       No results  found for any visits on 12/25/19.  Assessment & Plan    1. Essential hypertension  Negative doppler since onset of symptoms. Likely 2/2 amlodipine. Discussed stopping amlodipine and trial of HCTZ. She has had electrolyte abnormalities in the past however this was with concurrent Lasix use. Can try again, start as below and f/u in 1 month for BP check and lab check.   - hydrochlorothiazide (HYDRODIURIL) 25 MG tablet; Take 1 tablet (25 mg total) by mouth daily.  Dispense: 90 tablet; Refill: 0  2. Morbid obesity ([place])  - Amb Referral to Bariatric Surgery  3. Prediabetes     No follow-ups on file.      ITrinna Post, PA-C, have reviewed all documentation for this visit. The documentation on 12/25/19 for the exam, diagnosis, procedures, and orders are all accurate and complete.  The entirety of the information documented in the History of Present Illness, Review of Systems and Physical Exam were personally obtained by me. Portions of this information were initially documented by [facility] and reviewed by me for thoroughness and accuracy.     Paulene Floor  [facility] [phone] (phone) [phone] (fax)  [place]

## 2019-12-25 NOTE — Telephone Encounter (Signed)
Copied from [place] [id number]. Topic: Clinical - COVID Pre-Screen >> Dec 25, 2019  8:24 AM Leward Quan A wrote: 1. To the best of your knowledge, have you been in close contact with anyone with a confirmed diagnosis of COVID 19?  no  If no - Proceed to next question; If yes - Schedule patient for a virtual visit  2. Have you had any one or more of the following: fever, chills, cough, shortness of breath or any flu-like symptoms?  no  If no - Proceed to next question; If yes - Schedule patient for a virtual visit  3. Have you been diagnosed with or have a previous diagnosis of COVID 19?  no  If no - Proceed to next question; If yes - Schedule patient for a virtual visit  4. I am going to go over a few other symptoms with you. Please let me know if you are experiencing any of the following: no  Ear, nose or throat discomfort  A sore throat  Headache  Muscle pain  Diarrhea  Loss of taste or smell  If no - Continue with scheduling process; If yes - Document in scheduling notes   Thank you for answering these questions. Please know we will ask you these questions or similar questions when you arrive for your appointment and again it's how we are keeping everyone safe. Also, to keep you safe, please use the provided hand sanitizer when you enter the building. Susan [name], we are asking everyone in the building to wear a mask because they help [name] prevent the spread of germs.   Do you have a mask of your own, if not, we are happy to provide one for you. The last thing I want to go over with you is the no visitor guidelines. This means no one can attend the appointment with you unless you need physical assistance. I understand this may be different from your past appointments and I know this may be difficult but please know if someone is driving you we are happy to call them for you once your appointment is over.

## 2019-12-25 NOTE — Telephone Encounter (Signed)
Patient was seen in the office today

## 2019-12-25 NOTE — Telephone Encounter (Signed)
Copied from [place] [id number]. Topic: Medicare AWV >> Dec 25, 2019  9:46 AM Cher Nakai R wrote: Reason for CRM:   Left message for patient to call back and schedule Medicare Annual Wellness Visit (AWV) either virtually or in office.  Last AWV 07/19/2018  Please schedule at anytime with [facility] Health Advisor.  If any questions, please contact me at [phone]

## 2019-12-26 ENCOUNTER — Ambulatory Visit (INDEPENDENT_AMBULATORY_CARE_PROVIDER_SITE_OTHER): Payer: Medicare Other | Admitting: Psychology

## 2019-12-26 DIAGNOSIS — F3289 Other specified depressive episodes: Secondary | ICD-10-CM | POA: Diagnosis not present

## 2019-12-30 ENCOUNTER — Other Ambulatory Visit: Payer: Self-pay | Admitting: Psychiatry

## 2019-12-30 DIAGNOSIS — F4310 Post-traumatic stress disorder, unspecified: Secondary | ICD-10-CM

## 2019-12-30 DIAGNOSIS — F3161 Bipolar disorder, current episode mixed, mild: Secondary | ICD-10-CM

## 2020-01-10 ENCOUNTER — Telehealth (INDEPENDENT_AMBULATORY_CARE_PROVIDER_SITE_OTHER): Payer: Medicare Other | Admitting: Psychiatry

## 2020-01-10 ENCOUNTER — Other Ambulatory Visit: Payer: Self-pay

## 2020-01-10 ENCOUNTER — Encounter: Payer: Self-pay | Admitting: Psychiatry

## 2020-01-10 DIAGNOSIS — F1421 Cocaine dependence, in remission: Secondary | ICD-10-CM

## 2020-01-10 DIAGNOSIS — F3178 Bipolar disorder, in full remission, most recent episode mixed: Secondary | ICD-10-CM | POA: Diagnosis not present

## 2020-01-10 DIAGNOSIS — F1021 Alcohol dependence, in remission: Secondary | ICD-10-CM

## 2020-01-10 DIAGNOSIS — Z634 Disappearance and death of family member: Secondary | ICD-10-CM

## 2020-01-10 DIAGNOSIS — F17200 Nicotine dependence, unspecified, uncomplicated: Secondary | ICD-10-CM

## 2020-01-10 DIAGNOSIS — F4310 Post-traumatic stress disorder, unspecified: Secondary | ICD-10-CM | POA: Diagnosis not present

## 2020-01-10 MED ORDER — HYDROXYZINE HCL 25 MG PO TABS: 25.0000 mg | ORAL_TABLET | Freq: Three times a day (TID) | ORAL | 1 refills | Status: DC | PRN

## 2020-01-10 NOTE — Progress Notes (Signed)
Provider Location : [facility] Patient Location : Home  Participants: Patient , Provider  Virtual Visit via Video [name]  I connected with Susan [name] on 01/10/20 at  8:30 AM EDT by a video enabled telemedicine application and verified that I am speaking with the correct person using two identifiers.   I discussed the limitations of evaluation and management by telemedicine and the availability of in person appointments. The patient expressed understanding and agreed to proceed.    I discussed the assessment and treatment plan with the patient. The patient was provided an opportunity to ask questions and all were answered. The patient agreed with the plan and demonstrated an understanding of the instructions.   The patient was advised to call back or seek an in-person evaluation if the symptoms worsen or if the condition fails to improve as anticipated.   Susan [name]  01/10/2020 8:55 AM Susan [name]  MRN:  [id number]  Chief Complaint:  Chief Complaint    Follow-up     HPI: Susan [name] is a 59 year old Caucasian female, widowed, on disability, lives in [place], has a history of bipolar disorder, alcohol and cocaine use disorder in remission, obstructive sleep apnea on CPAP, hypertension, degenerative disc disease, status post total hip replacement, osteoarthritis, hepatitis C was evaluated by telemedicine today.  Patient today reports she is currently making progress with regards to her mood symptoms on the Latuda.  The Latuda 40 mg definitely helps her.  She calls it her 'miracle drug'.  She denies any side effects to the medication.  She reports sleep is improved.  She is able to sleep 7 hours without any disruption.  She reports her appetite is okay.  She is trying to watch her diet and is currently going to the gym.  She lost 10 pounds in 10 days and is excited about that.  She reports she continues to attend AA meetings.  She reports she does have panic  attacks on and off due to situational stressors.  Her son moved in back with her and that is a stressor for her.  She however reports she has told her son as well as her roommate to move out since she wants to be alone and does not want anyone else in the house.  She reports she looks forward to that.  Patient denies any suicidality, homicidality or perceptual disturbances.  Patient denies any other concerns today.  Visit Diagnosis:    ICD-10-CM   1. Bipolar disorder, in full remission, most recent episode mixed ([place])  F31.78   2. PTSD (post-traumatic stress disorder)  F43.10 hydrOXYzine (ATARAX/VISTARIL) 25 MG tablet  3. Bereavement  Z63.4   4. Tobacco use disorder  F17.200   5. Alcohol use disorder, severe, in sustained remission ([place])  F10.21   6. Cocaine use disorder, moderate, in sustained remission ([place])  F14.21     Past Psychiatric History: I have reviewed past psychiatric history from my progress [name] on 09/27/2019.  Past trials of Seroquel, Cymbalta, Abilify, risperidone.  Past Medical History:  Past Medical History:  Diagnosis Date  . ADD (attention deficit disorder)   . Alcohol abuse   . Anemia   . Anxiety   . Aortic atherosclerosis ([place]) 02/20/2018   Chest CT Sept 2019  . Arthritis    rheumatoid arthritis  . Asthma   . Bipolar disorder ([place])   . Centrilobular emphysema ([place]) 02/20/2018   Chest CT Sept 2019  . Constipation   . Degenerative disc  disease at L5-S1 level 09/28/2016   See ortho [name] May 2018  . Depression    bipolar, hx of suicide attempt  . Drug use   . Dyspnea    with exertion  . GERD (gastroesophageal reflux disease)   . H/O suicide attempt    slit wrists  . Hepatitis C 06/26/2014  . Hip pain   . History of alcohol abuse   . History of diverticulitis 2013  . History of hepatitis C    HEP "C"--three years ago  . History of MRSA infection 2013  . HLD (hyperlipidemia)   . Hypertension   . Hypothyroidism   . Incisional hernia 11/08/2012  . Knee  pain   . 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 Multinodular thyroid   . OSA (obstructive sleep apnea)    managed by Dr. Manuella Ghazi  . Osteoporosis   . Post-traumatic osteoarthritis of right knee 09/24/2015  . Prediabetes   . Recurrent ventral hernia 11/08/2012  . Sleep apnea     Dx 3 years ago. Use C-PAP  . 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 Thyroid disease    Goiter  . Vitamin B12 deficiency   . Vitamin D deficiency disease     Past Surgical History:  Procedure Laterality Date  . BILATERAL SALPINGOOPHORECTOMY     due to abnormal mass  . BREAST BIOPSY Left    neg  . BREAST SURGERY Left 20 yrs ago  . CESAREAN SECTION    . COLONOSCOPY    . COLONOSCOPY WITH PROPOFOL N/A 10/16/2017   Procedure: COLONOSCOPY WITH PROPOFOL;  Surgeon: Jonathon Bellows, MD;  Location: [facility] ENDOSCOPY;  Service: Gastroenterology;  Laterality: N/A;  . HERNIA REPAIR  06/2011, July 2014   Ventral wall repair with Physiomesh  . HERNIA REPAIR     2nd.vental wall repair  . JOINT REPLACEMENT Right    knee  . TONSILLECTOMY    . TOTAL HIP ARTHROPLASTY Left 07/23/2019   Procedure: TOTAL HIP ARTHROPLASTY;  Surgeon: Corky Mull, MD;  Location: [facility] ORS;  Service: Orthopedics;  Laterality: Left;  . TOTAL KNEE ARTHROPLASTY Right 05/10/2016   Procedure: TOTAL KNEE ARTHROPLASTY;  Surgeon: Corky Mull, MD;  Location: [facility] ORS;  Service: Orthopedics;  Laterality: Right;  . TUBAL LIGATION      Family Psychiatric History: I have reviewed family psychiatric history from my progress [name] on 09/27/2019.  Family History:  Family History  Problem Relation Age of Onset  . Alcohol abuse Father   . Heart attack Father   . Arthritis Mother   . Asthma Mother   . Mental illness Mother   . Thyroid disease Mother   . COPD Mother   . Heart disease Mother   . Congestive Heart Failure Mother   . Alcohol abuse Mother   . Eating disorder Mother   . Bipolar disorder Mother   . Arthritis Brother   . Mental illness Brother    . Cancer Brother        non-hodkins lymphoma  . Alcohol abuse Sister   . Drug abuse Sister   . Mental illness Sister   . Mental illness Sister   . Fibromyalgia Sister   . Obesity Sister   . Pneumonia Sister   . Depression Sister   . Mental illness Sister   . Alcohol abuse Sister   . Drug abuse Sister   . Drug abuse Son   . Alcohol abuse Son   . Drug abuse Son   .  Alcohol abuse Son   . Drug abuse Daughter   . Diabetes Neg Hx   . Stroke Neg Hx   . Breast cancer Neg Hx     Social History: I have reviewed social history from my progress [name] on 09/27/2019. Social History   Socioeconomic History  . Marital status: Widowed    Spouse name: Not on file  . Number of children: 3  . Years of education: Not on file  . Highest education level: Some college, no degree  Occupational History  . Occupation: disabled  Tobacco Use  . Smoking status: Current Every Day Smoker    Packs/day: 1.00    Years: 41.00    Pack years: 41.00    Types: Cigarettes  . Smokeless tobacco: Never Used  Vaping Use  . Vaping Use: Former  . Quit date: 07/15/2017  Substance and Sexual Activity  . Alcohol use: No    Alcohol/week: 0.0 standard drinks    Comment: sober since October 2018  . 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 Sexual activity: Not Currently  Other Topics Concern  . Not on file  Social History Narrative  . Not on file   Social Determinants of Health   Financial Resource Strain:   . Difficulty of Paying Living Expenses: Not on file  Food Insecurity:   . Worried About Charity fundraiser in the Last Year: Not on file  . Ran Out of Food in the Last Year: Not on file  Transportation Needs:   . Lack of Transportation (Medical): Not on file  . Lack of Transportation (Non-Medical): Not on file  Physical Activity:   . Days of Exercise per Week: Not on file  . Minutes of Exercise per Session: Not on file  Stress:   . Feeling of Stress : Not on file   Social Connections:   . Frequency of Communication with Friends and Family: Not on file  . Frequency of Social Gatherings with Friends and Family: Not on file  . Attends Religious Services: Not on file  . Active Member of Clubs or Organizations: Not on file  . Attends Archivist Meetings: Not on file  . Marital Status: Not on file    Allergies:  Allergies  Allergen Reactions  . Hydrochlorothiazide Other (See Comments)    Electrolyte imbalance  . Lasix [Furosemide] Other (See Comments)    Electrolyte imbalance    Metabolic Disorder Labs: Lab Results  Component Value Date   HGBA1C 6.3 (A) 11/06/2019   MPG 108 04/08/2019   MPG 103 08/16/2017   No results found for: PROLACTIN Lab Results  Component Value Date   CHOL 143 08/21/2019   TRIG 83 08/21/2019   HDL 47 (L) 08/21/2019   CHOLHDL 3.0 08/21/2019   VLDL 27 10/14/2016   LDLCALC 79 08/21/2019   LDLCALC 79 04/08/2019   Lab Results  Component Value Date   TSH 1.09 10/16/2019   TSH 1.430 07/26/2018    Therapeutic Level Labs: No results found for: LITHIUM No results found for: VALPROATE No components found for:  CBMZ  Current Medications: Current Outpatient Medications  Medication Sig Dispense Refill  . albuterol (VENTOLIN HFA) 108 (90 Base) MCG/ACT inhaler Inhale 2 puffs into the lungs every 4 (four) hours as needed for wheezing or shortness of breath. 18 g 2  . atenolol (TENORMIN) 25 MG tablet Take 1 tablet (25 mg total) by mouth daily. 90 tablet 3  .  atorvastatin (LIPITOR) 10 MG tablet Take 1 tablet (10 mg total) by mouth at bedtime. 90 tablet 3  . clindamycin (CLEOCIN T) 1 % external solution Apply to aa's scalp QD PRN 60 mL 3  . clotrimazole (LOTRIMIN) 1 % cream Apply 1 application topically 2 (two) times daily. 30 g 0  . cyclobenzaprine (FLEXERIL) 5 MG tablet Take 5 mg by mouth 2 (two) times daily as needed for muscle spasms.     . DULoxetine (CYMBALTA) 60 MG capsule Take 1 capsule (60 mg total) by  mouth 2 (two) times daily. 180 capsule 3  . Fluticasone-Umeclidin-Vilant (TRELEGY ELLIPTA) 100-62.5-25 MCG/INH AEPB Inhale 1 puff into the lungs daily. 28 each 11  . gabapentin (NEURONTIN) 300 MG capsule Take 600 mg by mouth 2 (two) times daily.     . hydrochlorothiazide (HYDRODIURIL) 25 MG tablet Take 1 tablet (25 mg total) by mouth daily. 90 tablet 0  . HYDROcodone-acetaminophen (NORCO/VICODIN) 5-325 MG tablet Take 1 tablet by mouth daily as needed.    . hydrOXYzine (ATARAX/VISTARIL) 25 MG tablet Take 1 tablet (25 mg total) by mouth 3 (three) times daily as needed for anxiety. For breakthrough anxiety attacks 30 tablet 1  . ketoconazole (NIZORAL) 2 % shampoo Apply 1 application topically 2 (two) times a week. 120 mL 0  . ketoconazole (NIZORAL) 2 % shampoo Shampoo into the scalp let sit 10 minutes then wash out. Use 3d/wk. 120 mL 3  . LATUDA 40 MG TABS tablet TAKE 1 TABLET (40 MG TOTAL) BY MOUTH DAILY WITH SUPPER. 30 tablet 1  . loratadine (CLARITIN) 10 MG tablet Take 1 tablet (10 mg total) by mouth daily as needed for allergies. (Patient taking differently: Take 10 mg by mouth daily. ) 90 tablet 3  . losartan (COZAAR) 100 MG tablet Take 1 tablet (100 mg total) by mouth daily. 90 tablet 3  . meloxicam (MOBIC) 15 MG tablet Take by mouth.    Marland Kitchen omeprazole (PRILOSEC) 40 MG capsule Take 1 capsule (40 mg total) by mouth daily. 90 capsule 3  . umeclidinium-vilanterol (ANORO ELLIPTA) 62.5-25 MCG/INH AEPB Inhale 1 puff into the lungs daily. 60 each 5  . vitamin B-12 (CYANOCOBALAMIN) 1000 MCG tablet Take 1,000 mcg by mouth daily. (Patient not taking: Reported on 12/25/2019)     No current facility-administered medications for this visit.     Musculoskeletal: Strength & Muscle Tone: UTA Gait & Station: normal Patient leans: N/A  Psychiatric Specialty Exam: Review of Systems  Psychiatric/Behavioral: The patient is nervous/anxious.   All other systems reviewed and are negative.   There were no  vitals taken for this visit.There is no height or weight on file to calculate BMI.  General Appearance: Casual  Eye Contact:  Fair  Speech:  Clear and Coherent  Volume:  Normal  Mood:  Anxious improving  Affect:  Congruent  Thought Process:  Goal Directed and Descriptions of Associations: Intact  Orientation:  Full (Time, Place, and Person)  Thought Content: Logical   Suicidal Thoughts:  No  Homicidal Thoughts:  No  Memory:  Immediate;   Fair Recent;   Fair Remote;   Fair  Judgement:  Fair  Insight:  Fair  Psychomotor Activity:  Normal  Concentration:  Concentration: Fair and Attention Span: Fair  Recall:  [employer] of Knowledge: Fair  Language: Fair  Akathisia:  No  Handed:  Right  AIMS (if indicated): UTA  Assets:  Communication Skills Desire for Improvement Housing Social Support  ADL's:  Intact  Cognition:  WNL  Sleep:  Fair   Screenings: GAD-7     Office Visit from 11/06/2019 in [facility] Office Visit from 09/06/2018 in [facility]  Total GAD-7 Score 21 14    PHQ2-9     Office Visit from 11/06/2019 in [facility] Office Visit from 10/16/2019 in [facility] Office Visit from 08/21/2019 in [facility] Office Visit from 04/08/2019 in [facility] Office Visit from 01/04/2019 in [facility]  PHQ-2 Total Score 6 6 6 1 3   PHQ-9 Total Score 13 23 13 1 9        Assessment and Plan: SERIAH BROTZMAN is a 59 year old Caucasian female who has a history of bipolar disorder, degenerative disc disease status post hip replacement, essential hypertension, OSA on CPAP, hepatitis C was evaluated by telemedicine today.  Patient is biologically predisposed given her family history of mental health problems, her own history of substance abuse problems and trauma.  Patient with psychosocial stressors of recent deaths in the family, children who struggle with  substance abuse problems ,current pandemic and loss of her dog, her own medical problems.  Patient however is currently making progress on the [place].  Discussed plan as noted below.  Plan Bipolar disorder in remission Latuda 40 mg p.o. daily with supper Cymbalta as prescribed  PTSD-improving Cymbalta 60 mg p.o. twice daily. Add hydroxyzine 25 mg p.o. 3 times daily as needed for severe breakthrough anxiety attacks only.  She will limit use. Continue CBT with therapist Mr.Mustafa.  Bereavement-improving Continue grief counseling.  Tobacco use disorder-improving Provided counseling  Alcohol and cocaine use disorder in remission She continues to be active in [place] meetings  Follow-up in clinic in 8 weeks or sooner if needed.  I have spent atleast 20 minutes face to face with patient today. More than 50 % of the time was spent for preparing to see the patient ( e.g., review of test, records ), ordering medications and test ,psychoeducation and supportive psychotherapy and care coordination,as well as documenting clinical information in electronic health record. This [name] was generated in part or whole with voice recognition software. Voice recognition is usually quite accurate but there are transcription errors that can and very often do occur. I apologize for any typographical errors that were not detected and corrected.        Ursula Alert, MD 01/10/2020, 8:55 AM

## 2020-01-17 ENCOUNTER — Ambulatory Visit (INDEPENDENT_AMBULATORY_CARE_PROVIDER_SITE_OTHER): Payer: Medicare Other | Admitting: Psychology

## 2020-01-17 DIAGNOSIS — F3289 Other specified depressive episodes: Secondary | ICD-10-CM

## 2020-01-20 ENCOUNTER — Ambulatory Visit (INDEPENDENT_AMBULATORY_CARE_PROVIDER_SITE_OTHER): Payer: Medicare Other | Admitting: Dermatology

## 2020-01-20 ENCOUNTER — Encounter: Payer: Self-pay | Admitting: Dermatology

## 2020-01-20 ENCOUNTER — Other Ambulatory Visit: Payer: Self-pay

## 2020-01-20 DIAGNOSIS — L739 Follicular disorder, unspecified: Secondary | ICD-10-CM | POA: Diagnosis not present

## 2020-01-20 DIAGNOSIS — L719 Rosacea, unspecified: Secondary | ICD-10-CM | POA: Diagnosis not present

## 2020-01-20 MED ORDER — KETOCONAZOLE 2 % EX SHAM: MEDICATED_SHAMPOO | CUTANEOUS | 3 refills | Status: DC

## 2020-01-20 NOTE — Progress Notes (Signed)
   Follow-Up Visit   Subjective  Susan [name] is a 59 y.o. female who presents for the following: Follow-up (Folliculitis/Rosacea with pityrosporum folliculitis follow up of scalp. She is clear today. Treating with Ketoconazole shampoo 3 times per week and CLn wash 1 time per week. She has not used Clindamycin solution lately since she is clear.).  The following portions of the chart were reviewed this encounter and updated as appropriate:  Tobacco  Allergies  Meds  Problems  Med Hx  Surg Hx  Fam Hx     Review of Systems:  No other skin or systemic complaints except as noted in HPI or Assessment and Plan.  Objective  Well appearing patient in no apparent distress; mood and affect are within normal limits.  A focused examination was performed including scalp. Relevant physical exam findings are noted in the Assessment and Plan.  Objective  Scalp: Couple residual crusts.   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 Advised patient control is the goal, condition is not curable.   Continue Ketoconazole 2% shampoo 3 times per week and Cln shampoo once a week.  Continue Clindamycin solution prn flares.  clindamycin (CLEOCIN T) 1 % external solution - Scalp  ketoconazole (NIZORAL) 2 % shampoo - Scalp  Return in about 1 year (around 01/19/2021).   I, Ashok Cordia, CMA, am acting as scribe for Sarina Ser, MD .  Documentation: I have reviewed the above documentation for accuracy and completeness, and I agree with the above.  Sarina Ser, MD

## 2020-01-24 NOTE — Progress Notes (Addendum)
Established patient visit   Patient: Susan [name]   DOB: [birth year]   59 y.o. Female  MRN: [id number] Visit Date: 01/27/2020  Today's healthcare provider: Marcille Buffy, FNP   Chief Complaint  Patient presents with  . Hypertension   Subjective    HPI  Hypertension, follow-up  BP Readings from Last 3 Encounters:  01/27/20 134/70  12/25/19 (!) 144/88  12/05/19 (!) 142/82   Wt Readings from Last 3 Encounters:  01/27/20 235 lb (106.6 kg)  12/25/19 240 lb 12.8 oz (109.2 kg)  12/05/19 (!) 241 lb 3.2 oz (109.4 kg)     She was last seen for hypertension 1 months ago.  BP at that visit was 144/88. Management since that visit includes starting patient back on HCTZ 25mg .  She reports excellent compliance with treatment. She is having side effects.  She is following a Regular diet. She is exercising. She does smoke.  She reports she is feeling much better, denies dizziness, lightheadedness or any syncope.   Use of agents associated with hypertension: none.   Outside blood pressures are not being checked.  Desires blood pressure check. Leg swelling resolved, negative for DVT on [name].   Is still overdue on mammogram. Will place order today she will call [facility] to schedule.  She was previously seen by Marton Redwood and added on trial of HCTZ of which she is doing well on. Scheduled for labs in one month history of electrolyte abnormalities with concurrent Lasix treatment in the past.   Continues to  Smoke 1ppd for 45 years- declines additional screening CT scan at this time.   Patient  denies any fever, body aches,chills, rash, chest pain, shortness of breath, nausea, vomiting, or diarrhea.   She also is doing well on her Cymbalta has been on for " years' Previous PCP prescribed she does not need refills today but may need in future she reports. Denies any suicidal or homicidal ideations or intents.   Symptoms: No chest pain No chest pressure   No palpitations No syncope  No dyspnea No orthopnea  No paroxysmal nocturnal dyspnea No lower extremity edema   Pertinent labs: Lab Results  Component Value Date   CHOL 143 08/21/2019   HDL 47 (L) 08/21/2019   LDLCALC 79 08/21/2019   TRIG 83 08/21/2019   CHOLHDL 3.0 08/21/2019   Lab Results  Component Value Date   NA 138 10/16/2019   K 4.0 10/16/2019   CREATININE 0.92 10/16/2019   GFRNONAA 69 10/16/2019   GFRAA 80 10/16/2019   GLUCOSE 179 (H) 10/16/2019     The 10-year ASCVD risk score Mikey Bussing DC Jr., et al., 2013) is: 8%   ---------------------------------------------------------------------------------------------------   Patient Active Problem List   Diagnosis Date Noted  . Osteoporosis 11/06/2019  . Bipolar disorder, current episode mixed, mild ([place]) 10/29/2019  . PTSD (post-traumatic stress disorder) 09/27/2019  . Bereavement 09/27/2019  . Cocaine use disorder, moderate, in sustained remission ([place]) 09/27/2019  . Status post total hip replacement, left 07/23/2019  . Primary osteoarthritis of left hip 06/14/2019  . Insulin resistance 02/18/2019  . Prediabetes 12/31/2018  . Depression 10/23/2018  . Class 3 severe obesity with serious comorbidity and body mass index (BMI) of 40.0 to 44.9 in adult ([place]) 10/23/2018  . Allergic rhinitis 08/23/2018  . Aortic atherosclerosis ([place]) 02/20/2018  . Centrilobular emphysema ([place]) 02/20/2018  . Dyslipidemia 03/28/2017  . Degenerative disc disease at L5-S1 level 09/28/2016  . Tobacco use disorder 04/21/2016  .  GERD (gastroesophageal reflux disease) 12/17/2015  . Chronic back pain 10/09/2015  . Post-traumatic osteoarthritis of right knee 09/24/2015  . Alcohol use disorder, severe, in sustained remission ([place]) 04/13/2015  . Vitamin D deficiency 04/09/2015  . Vitamin B12 deficiency 04/09/2015  . OSA on CPAP 04/09/2015  . Subclinical hyperthyroidism 06/26/2014  . Toxic multinodular goiter 06/26/2014  . Hypertension 06/26/2014    . Bipolar I disorder ([place]) 06/26/2014  . Lumbar radiculitis 10/03/2013  . Diverticulosis 08/24/2013   Past Medical History:  Diagnosis Date  . ADD (attention deficit disorder)   . Alcohol abuse   . Anemia   . Anxiety   . Aortic atherosclerosis ([place]) 02/20/2018   Chest CT Sept 2019  . Arthritis    rheumatoid arthritis  . Asthma   . Bipolar disorder ([place])   . Centrilobular emphysema ([place]) 02/20/2018   Chest CT Sept 2019  . Constipation   . Degenerative disc disease at L5-S1 level 09/28/2016   See ortho note May 2018  . Depression    bipolar, hx of suicide attempt  . Drug use   . Dyspnea    with exertion  . GERD (gastroesophageal reflux disease)   . H/O suicide attempt    slit wrists  . Hepatitis C 06/26/2014  . Hip pain   . History of alcohol abuse   . History of diverticulitis 2013  . History of hepatitis C    HEP "C"--three years ago  . History of MRSA infection 2013  . HLD (hyperlipidemia)   . Hypertension   . Hypothyroidism   . Incisional hernia 11/08/2012  . Knee pain   . 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 Multinodular thyroid   . OSA (obstructive sleep apnea)    managed by Dr. Manuella Ghazi  . Osteoporosis   . Post-traumatic osteoarthritis of right knee 09/24/2015  . Prediabetes   . Recurrent ventral hernia 11/08/2012  . Sleep apnea     Dx 3 years ago. Use C-PAP  . 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 Thyroid disease    Goiter  . Vitamin B12 deficiency   . Vitamin D deficiency disease    Allergies  Allergen Reactions  . Lasix [Furosemide] Other (See Comments)    Electrolyte imbalance       Medications: Outpatient Medications Prior to Visit  Medication Sig Note  . albuterol (VENTOLIN HFA) 108 (90 Base) MCG/ACT inhaler Inhale 2 puffs into the lungs every 4 (four) hours as needed for wheezing or shortness of breath.   Marland Kitchen atenolol (TENORMIN) 25 MG tablet Take 1 tablet (25 mg total) by mouth daily.   Marland Kitchen atorvastatin (LIPITOR) 10 MG tablet Take 1  tablet (10 mg total) by mouth at bedtime.   . clindamycin (CLEOCIN T) 1 % external solution Apply to aa's scalp QD PRN   . cyclobenzaprine (FLEXERIL) 5 MG tablet Take 5 mg by mouth 2 (two) times daily as needed for muscle spasms.    . DULoxetine (CYMBALTA) 60 MG capsule Take 1 capsule (60 mg total) by mouth 2 (two) times daily.   . Fluticasone-Umeclidin-Vilant (TRELEGY ELLIPTA) 100-62.5-25 MCG/INH AEPB Inhale 1 puff into the lungs daily.   Marland Kitchen gabapentin (NEURONTIN) 300 MG capsule Take 600 mg by mouth 2 (two) times daily.    Marland Kitchen HYDROcodone-acetaminophen (NORCO/VICODIN) 5-325 MG tablet Take 1 tablet by mouth daily as needed.   Marland Kitchen ketoconazole (NIZORAL) 2 % shampoo Apply 1 application topically 2 (two) times a week.   Marland Kitchen ketoconazole (NIZORAL) 2 %  shampoo Shampoo into the scalp let sit 10 minutes then wash out. Use 3d/wk.   Marland Kitchen LATUDA 40 MG TABS tablet TAKE 1 TABLET (40 MG TOTAL) BY MOUTH DAILY WITH SUPPER.   Marland Kitchen loratadine (CLARITIN) 10 MG tablet Take 1 tablet (10 mg total) by mouth daily as needed for allergies. (Patient taking differently: Take 10 mg by mouth daily. )   . losartan (COZAAR) 100 MG tablet Take 1 tablet (100 mg total) by mouth daily.   . meloxicam (MOBIC) 15 MG tablet Take by mouth.   Marland Kitchen omeprazole (PRILOSEC) 40 MG capsule Take 1 capsule (40 mg total) by mouth daily.   . vitamin B-12 (CYANOCOBALAMIN) 1000 MCG tablet Take 1,000 mcg by mouth daily.    . [DISCONTINUED] hydrochlorothiazide (HYDRODIURIL) 25 MG tablet Take 1 tablet (25 mg total) by mouth daily.   . clotrimazole (LOTRIMIN) 1 % cream Apply 1 application topically 2 (two) times daily. (Patient not taking: Reported on 01/27/2020) 01/27/2020: Prn   . hydrOXYzine (ATARAX/VISTARIL) 25 MG tablet Take 1 tablet (25 mg total) by mouth 3 (three) times daily as needed for anxiety. For breakthrough anxiety attacks (Patient not taking: Reported on 01/27/2020)   . umeclidinium-vilanterol (ANORO ELLIPTA) 62.5-25 MCG/INH AEPB Inhale 1 puff into the  lungs daily. (Patient not taking: Reported on 01/27/2020)    No facility-administered medications prior to visit.    Review of Systems  Constitutional: Positive for fatigue (reports as " always chronic :" no change ). Negative for activity change, appetite change, chills, diaphoresis, fever and unexpected weight change.  HENT: Negative.   Respiratory: Negative.   Cardiovascular: Negative.   Gastrointestinal: Negative.   Genitourinary: Negative.   Musculoskeletal: Negative.   Skin: Negative.   Hematological: Negative.   Psychiatric/Behavioral: Negative.     Last CBC Lab Results  Component Value Date   WBC 12.0 (H) 07/24/2019   HGB 11.9 (L) 07/24/2019   HCT 36.6 07/24/2019   MCV 96.6 07/24/2019   MCH 31.4 07/24/2019   RDW 11.9 07/24/2019   PLT 167 77/82/4235   Last metabolic panel Lab Results  Component Value Date   GLUCOSE 179 (H) 10/16/2019   NA 138 10/16/2019   K 4.0 10/16/2019   CL 106 10/16/2019   CO2 22 10/16/2019   BUN 13 10/16/2019   CREATININE 0.92 10/16/2019   GFRNONAA 69 10/16/2019   GFRAA 80 10/16/2019   CALCIUM 9.0 10/16/2019   PROT 6.2 10/16/2019   ALBUMIN 4.0 12/17/2018   LABGLOB 2.3 07/26/2018   AGRATIO 2.0 07/26/2018   BILITOT 0.3 10/16/2019   ALKPHOS 85 12/17/2018   AST 15 10/16/2019   ALT 15 10/16/2019   ANIONGAP 9 07/24/2019   Last lipids Lab Results  Component Value Date   CHOL 143 08/21/2019   HDL 47 (L) 08/21/2019   LDLCALC 79 08/21/2019   TRIG 83 08/21/2019   CHOLHDL 3.0 08/21/2019   Last hemoglobin A1c Lab Results  Component Value Date   HGBA1C 6.3 (A) 11/06/2019   Last thyroid functions Lab Results  Component Value Date   TSH 1.09 10/16/2019   T3TOTAL 177 07/26/2018   Last vitamin D Lab Results  Component Value Date   VD25OH 21.8 (L) 01/16/2019   Last vitamin B12 and Folate Lab Results  Component Value Date   VITAMINB12 1,442 (H) 07/26/2018   FOLATE 6.5 07/26/2018      Objective    BP 134/70   Pulse 64    Temp 98.1 F (36.7 C) (Oral)   Resp 15  Wt 235 lb (106.6 kg)   SpO2 98%   BMI 42.98 kg/m  BP Readings from Last 3 Encounters:  01/27/20 134/70  12/25/19 (!) 144/88  12/05/19 (!) 142/82      Physical Exam Vitals reviewed.  Constitutional:      General: She is not in acute distress.    Appearance: She is well-developed. She is obese. She is not ill-appearing, toxic-appearing or diaphoretic.     Interventions: She is not intubated. HENT:     Head: Normocephalic and atraumatic.     Right Ear: External ear normal.     Left Ear: External ear normal.     Nose: Nose normal.     Mouth/Throat:     Mouth: Mucous membranes are moist.     Pharynx: No oropharyngeal exudate or posterior oropharyngeal erythema.  Eyes:     General: Lids are normal. No scleral icterus.       Right eye: No discharge.        Left eye: No discharge.     Extraocular Movements: Extraocular movements intact.     Conjunctiva/sclera: Conjunctivae normal.     Right eye: Right conjunctiva is not injected. No exudate or hemorrhage.    Left eye: Left conjunctiva is not injected. No exudate or hemorrhage.    Pupils: Pupils are equal, round, and reactive to light.  Neck:     Thyroid: No thyroid mass or thyromegaly.     Vascular: Normal carotid pulses. No carotid bruit, hepatojugular reflux or JVD.     Trachea: Trachea and phonation normal. No tracheal tenderness or tracheal deviation.     Meningeal: Brudzinski's sign and Kernig's sign absent.  Cardiovascular:     Rate and Rhythm: Normal rate and regular rhythm.     Pulses: Normal pulses.          Radial pulses are 2+ on the right side and 2+ on the left side.       Dorsalis pedis pulses are 2+ on the right side and 2+ on the left side.       Posterior tibial pulses are 2+ on the right side and 2+ on the left side.     Heart sounds: Normal heart sounds, S1 normal and S2 normal. Heart sounds not distant. No murmur heard.  No friction rub. No gallop.   Pulmonary:      Effort: Pulmonary effort is normal. No tachypnea, bradypnea, accessory muscle usage or respiratory distress. She is not intubated.     Breath sounds: Normal breath sounds. No stridor. No wheezing, rhonchi or rales.  Chest:     Chest wall: No tenderness.  Abdominal:     General: Bowel sounds are normal. There is no distension or abdominal bruit.     Palpations: Abdomen is soft. There is no shifting dullness, fluid wave, hepatomegaly, splenomegaly, mass or pulsatile mass.     Tenderness: There is no abdominal tenderness. There is no right CVA tenderness, left CVA tenderness, guarding or rebound.     Hernia: No hernia is present.  Musculoskeletal:        General: No tenderness or deformity. Normal range of motion.     Cervical back: Full passive range of motion without pain, normal range of motion and neck supple. No edema, erythema or rigidity. No spinous process tenderness or muscular tenderness. Normal range of motion.  Lymphadenopathy:     Head:     Right side of head: No submental, submandibular, tonsillar, preauricular, posterior auricular or occipital adenopathy.  Left side of head: No submental, submandibular, tonsillar, preauricular, posterior auricular or occipital adenopathy.     Cervical: No cervical adenopathy.     Right cervical: No superficial, deep or posterior cervical adenopathy.    Left cervical: No superficial, deep or posterior cervical adenopathy.     Upper Body:     Right upper body: No supraclavicular or pectoral adenopathy.     Left upper body: No supraclavicular or pectoral adenopathy.  Skin:    General: Skin is warm and dry.     Capillary Refill: Capillary refill takes less than 2 seconds.     Coloration: Skin is not pale.     Findings: No abrasion, bruising, burn, ecchymosis, erythema, lesion, petechiae or rash.     Nails: There is no clubbing.  Neurological:     General: No focal deficit present.     Mental Status: She is alert and oriented to person,  place, and time.     GCS: GCS eye subscore is 4. GCS verbal subscore is 5. GCS motor subscore is 6.     Cranial Nerves: No cranial nerve deficit.     Sensory: No sensory deficit.     Motor: No tremor, atrophy, abnormal muscle tone or seizure activity.     Coordination: Coordination normal.     Gait: Gait normal.     Deep Tendon Reflexes: Reflexes are normal and symmetric. Reflexes normal. Babinski sign absent on the right side. Babinski sign absent on the left side.     Reflex Scores:      Tricep reflexes are 2+ on the right side and 2+ on the left side.      Bicep reflexes are 2+ on the right side and 2+ on the left side.      Brachioradialis reflexes are 2+ on the right side and 2+ on the left side.      Patellar reflexes are 2+ on the right side and 2+ on the left side.      Achilles reflexes are 2+ on the right side and 2+ on the left side. Psychiatric:        Mood and Affect: Mood normal.        Speech: Speech normal.        Behavior: Behavior normal.        Thought Content: Thought content normal.        Judgment: Judgment normal.      No results found for any visits on 01/27/20.  Assessment & Plan     1. Essential hypertension Labs in one month around 02/26/20 walk in lab instructions given. Refilled medication.   - CBC with Differential/Platelet; Standing - Comprehensive Metabolic Panel (CMET); Standing - hydrochlorothiazide (HYDRODIURIL) 25 MG tablet; Take 1 tablet (25 mg total) by mouth daily.  Dispense: 90 tablet; Refill: 0  2. Morbid obesity ([place]) She has been referred to bariatric clinic. Healthy lifestyle and diet/ exercise discussed.   3. Fatigue, unspecified type Rest - chronic fatigue. Labs repeat in one month.  - VITAMIN D 25 Hydroxy (Vit-D Deficiency, Fractures); Standing  4. Encounter for screening mammogram for malignant neoplasm of breast ordered see AVS for instructions.  - MM Digital Screening  5. Need for immunization against influenza discussed /  reviewed VIS sheet on influenza and after care instructions.  - Flu Vaccine QUAD 6+ mos PF IM (Fluarix Quad PF)  Smoking cessation discussed/ patient declined.   See note may need Cymbalta refill- ok to fill at next fill. GAD and  PHQ 9 at next visit advised.  Prediabetes   Labs in one month   Orders Placed This Encounter  Procedures  . MM Digital Screening  . Flu Vaccine QUAD 6+ mos PF IM (Fluarix Quad PF)  . CBC with Differential/Platelet  . Comprehensive Metabolic Panel (CMET)  . VITAMIN D 25 Hydroxy (Vit-D Deficiency, Fractures)  . HgB A1c   Return in about 3 months (around 04/27/2020), or if symptoms worsen or fail to improve, for at any time for any worsening symptoms.     I[place] Enrrique Mierzwa, FNP, have reviewed all documentation for this visit. The documentation on 01/27/20 for the exam, diagnosis, procedures, and orders are all accurate and complete.    Marcille Buffy, [place] [phone] (phone) [phone] (fax)  [place]

## 2020-01-27 ENCOUNTER — Other Ambulatory Visit: Payer: Self-pay

## 2020-01-27 ENCOUNTER — Ambulatory Visit (INDEPENDENT_AMBULATORY_CARE_PROVIDER_SITE_OTHER): Payer: Medicare Other | Admitting: Adult Health

## 2020-01-27 ENCOUNTER — Encounter: Payer: Self-pay | Admitting: Adult Health

## 2020-01-27 VITALS — BP 134/70 | HR 64 | Temp 98.1°F | Resp 15 | Wt 235.0 lb

## 2020-01-27 DIAGNOSIS — Z23 Encounter for immunization: Secondary | ICD-10-CM | POA: Diagnosis not present

## 2020-01-27 DIAGNOSIS — R5383 Other fatigue: Secondary | ICD-10-CM | POA: Diagnosis not present

## 2020-01-27 DIAGNOSIS — Z1231 Encounter for screening mammogram for malignant neoplasm of breast: Secondary | ICD-10-CM | POA: Insufficient documentation

## 2020-01-27 DIAGNOSIS — I10 Essential (primary) hypertension: Secondary | ICD-10-CM | POA: Diagnosis not present

## 2020-01-27 DIAGNOSIS — R7303 Prediabetes: Secondary | ICD-10-CM

## 2020-01-27 DIAGNOSIS — Z0000 Encounter for general adult medical examination without abnormal findings: Secondary | ICD-10-CM | POA: Insufficient documentation

## 2020-01-27 MED ORDER — HYDROCHLOROTHIAZIDE 25 MG PO TABS: 25.0000 mg | ORAL_TABLET | Freq: Every day | ORAL | 0 refills | Status: DC

## 2020-01-27 NOTE — Patient Instructions (Addendum)
Labs walk in in one month to lab    Call to schedule your screening mammogram. Your orders have been placed for your exam.  Let our office know if you have questions, concerns, or any difficulty scheduling.  If normal results then yearly screening mammograms are recommended unless you notice  Changes in your breast then you should schedule a follow up office visit. If abnormal results  Further imaging will be warranted and sooner follow up as determined by the radiologist at the [facility].   [facility] [place], [place] [address]  Main: [phone]     Hydrochlorothiazide, HCTZ Oral Capsules or Tablets What is this medicine? HYDROCHLOROTHIAZIDE (hye droe klor oh THYE a zide) is a diuretic. It helps you make more urine and to lose salt and excess water from your body. It treats swelling from heart, kidney, or liver disease. It also treats high blood pressure. This medicine may be used for other purposes; ask your health care provider or pharmacist if you have questions. COMMON BRAND NAME(S): Esidrix, Ezide, HydroDIURIL, Microzide, Oretic, Zide What should I tell my health care provider before I take this medicine? They need to know if you have any of these conditions:  diabetes  gout  immune system problems, like lupus  kidney disease or kidney stones  liver disease  pancreatitis  small amount of urine or difficulty passing urine  an unusual or allergic reaction to hydrochlorothiazide, sulfa drugs, other medicines, foods, dyes, or preservatives  pregnant or trying to get pregnant  breast-feeding How should I use this medicine? Take this drug by mouth. Take it as directed on the prescription label at the same time every day. You can take it with or without food. If it upsets your stomach, take it with food. Keep taking it unless your health care provider tells you to stop. Talk to your health care provider about the  use of this drug in children. While it may be prescribed for children as young as newborns for selected conditions, precautions do apply. Overdosage: If you think you have taken too much of this medicine contact a poison control center or emergency room at once. NOTE: This medicine is only for you. Do not share this medicine with others. What if I miss a dose? If you miss a dose, take it as soon as you can. If it is almost time for your next dose, take only that dose. Do not take double or extra doses. What may interact with this medicine?  cholestyramine  colestipol  digoxin  dofetilide  lithium  medicines for blood pressure  medicines for diabetes  medicines that relax muscles for surgery  other diuretics  steroid medicines like prednisone or cortisone This list may not describe all possible interactions. Give your health care provider a list of all the medicines, herbs, non-prescription drugs, or dietary supplements you use. Also tell them if you smoke, drink alcohol, or use illegal drugs. Some items may interact with your medicine. What should I watch for while using this medicine? Visit your doctor or health care professional for regular checks on your progress. Check your blood pressure as directed. Ask your doctor or health care professional what your blood pressure should be and when you should contact him or her. Talk to your health care professional about your risk of skin cancer. You may be more at risk for skin cancer if you take this medicine. This medicine can make you more sensitive  to the sun. Keep out of the sun. If you cannot avoid being in the sun, wear protective clothing and use sunscreen. Do not use sun lamps or tanning beds/booths. You may need to be on a special diet while taking this medicine. Ask your doctor. Check with your doctor or health care professional if you get an attack of severe diarrhea, nausea and vomiting, or if you sweat a lot. The loss of too  much body fluid can make it dangerous for you to take this medicine. You may get drowsy or dizzy. Do not drive, use machinery, or do anything that needs mental alertness until you know how this medicine affects you. Do not stand or sit up quickly, especially if you are an older patient. This reduces the risk of dizzy or fainting spells. Alcohol may interfere with the effect of this medicine. Avoid alcoholic drinks. This medicine may increase blood sugar. Ask your healthcare provider if changes in diet or medicines are needed if you have diabetes. What side effects may I notice from receiving this medicine? Side effects that you should report to your doctor or health care professional as soon as possible:  allergic reactions such as skin rash or itching, hives, swelling of the lips, mouth, tongue, or throat  changes in vision  chest pain  eye pain  fast or irregular heartbeat  feeling faint or lightheaded, falls  gout attack  muscle pain or cramps  pain or difficulty when passing urine  pain, tingling, numbness in the hands or feet  redness, blistering, peeling or loosening of the skin, including inside the mouth   signs and symptoms of high blood sugar such as being more thirsty or hungry or having to urinate more than normal. You may also feel very tired or have blurry vision.  unusually weak Side effects that usually do not require medical attention (report to your doctor or health care professional if they continue or are bothersome):  change in sex drive or performance  dry mouth  headache  stomach upset This list may not describe all possible side effects. Call your doctor for medical advice about side effects. You may report side effects to FDA at 1-800-FDA-1088. Where should I keep my medicine? Keep out of the reach of children and pets. Store at room temperature between 20 and 25 degrees C (68 and 77 degrees F). Protect from light and moisture. Keep the container  tightly closed. Do not freeze. Throw away any unused drug after the expiration date. NOTE: This sheet is a summary. It may not cover all possible information. If you have questions about this medicine, talk to your doctor, pharmacist, or health care provider.  2020 Elsevier/Gold Standard (2018-12-27 16:52:59) Managing Your Hypertension Hypertension is commonly called high blood pressure. This is when the force of your blood pressing against the walls of your arteries is too strong. Arteries are blood vessels that carry blood from your heart throughout your body. Hypertension forces the heart to work harder to pump blood, and may cause the arteries to become narrow or stiff. Having untreated or uncontrolled hypertension can cause heart attack, stroke, kidney disease, and other problems. What are blood pressure readings? A blood pressure reading consists of a higher number over a lower number. Ideally, your blood pressure should be below 120/80. The first ("top") number is called the systolic pressure. It is a measure of the pressure in your arteries as your heart beats. The second ("bottom") number is called the diastolic pressure. It is  a measure of the pressure in your arteries as the heart relaxes. What does my blood pressure reading mean? Blood pressure is classified into four stages. Based on your blood pressure reading, your health care provider may use the following stages to determine what type of treatment you need, if any. Systolic pressure and diastolic pressure are measured in a unit called mm Hg. Normal  Systolic pressure: below 409.  Diastolic pressure: below 80. Elevated  Systolic pressure: 735-329.  Diastolic pressure: below 80. Hypertension stage 1  Systolic pressure: 924-268.  Diastolic pressure: 34-19. Hypertension stage 2  Systolic pressure: 622 or above.  Diastolic pressure: 90 or above. What health risks are associated with hypertension? Managing your hypertension  is an important responsibility. Uncontrolled hypertension can lead to:  A heart attack.  A stroke.  A weakened blood vessel (aneurysm).  Heart failure.  Kidney damage.  Eye damage.  Metabolic syndrome.  Memory and concentration problems. What changes can I make to manage my hypertension? Hypertension can be managed by making lifestyle changes and possibly by taking medicines. Your health care provider will help you make a plan to bring your blood pressure within a normal range. Eating and drinking   Eat a diet that is high in fiber and potassium, and low in salt (sodium), added sugar, and fat. An example eating plan is called the DASH (Dietary Approaches to Stop Hypertension) diet. To eat this way: ? Eat plenty of fresh fruits and vegetables. Try to fill half of your plate at each meal with fruits and vegetables. ? Eat whole grains, such as whole wheat pasta, brown rice, or whole grain bread. Fill about one quarter of your plate with whole grains. ? Eat low-fat diary products. ? Avoid fatty cuts of meat, processed or cured meats, and poultry with skin. Fill about one quarter of your plate with lean proteins such as fish, chicken without skin, beans, eggs, and tofu. ? Avoid premade and processed foods. These tend to be higher in sodium, added sugar, and fat.  Reduce your daily sodium intake. Most people with hypertension should eat less than 1,500 mg of sodium a day.  Limit alcohol intake to no more than 1 drink a day for nonpregnant women and 2 drinks a day for men. One drink equals 12 oz of beer, 5 oz of wine, or 1 oz of hard liquor. Lifestyle  Work with your health care provider to maintain a healthy body weight, or to lose weight. Ask what an ideal weight is for you.  Get at least 30 minutes of exercise that causes your heart to beat faster (aerobic exercise) most days of the week. Activities may include walking, swimming, or biking.  Include exercise to strengthen your  muscles (resistance exercise), such as weight lifting, as part of your weekly exercise routine. Try to do these types of exercises for 30 minutes at least 3 days a week.  Do not use any products that contain nicotine or tobacco, such as cigarettes and e-cigarettes. If you need help quitting, ask your health care provider.  Control any long-term (chronic) conditions you have, such as high cholesterol or diabetes. Monitoring  Monitor your blood pressure at home as told by your health care provider. Your personal target blood pressure may vary depending on your medical conditions, your age, and other factors.  Have your blood pressure checked regularly, as often as told by your health care provider. Working with your health care provider  Review all the medicines you take with  your health care provider because there may be side effects or interactions.  Talk with your health care provider about your diet, exercise habits, and other lifestyle factors that may be contributing to hypertension.  Visit your health care provider regularly. Your health care provider can help you create and adjust your plan for managing hypertension. Will I need medicine to control my blood pressure? Your health care provider may prescribe medicine if lifestyle changes are not enough to get your blood pressure under control, and if:  Your systolic blood pressure is 130 or higher.  Your diastolic blood pressure is 80 or higher. Take medicines only as told by your health care provider. Follow the directions carefully. Blood pressure medicines must be taken as prescribed. The medicine does not work as well when you skip doses. Skipping doses also puts you at risk for problems. Contact a health care provider if:  You think you are having a reaction to medicines you have taken.  You have repeated (recurrent) headaches.  You feel dizzy.  You have swelling in your ankles.  You have trouble with your vision. Get help  right away if:  You develop a severe headache or confusion.  You have unusual weakness or numbness, or you feel faint.  You have severe pain in your chest or abdomen.  You vomit repeatedly.  You have trouble breathing. Summary  Hypertension is when the force of blood pumping through your arteries is too strong. If this condition is not controlled, it may put you at risk for serious complications.  Your personal target blood pressure may vary depending on your medical conditions, your age, and other factors. For most people, a normal blood pressure is less than 120/80.  Hypertension is managed by lifestyle changes, medicines, or both. Lifestyle changes include weight loss, eating a healthy, low-sodium diet, exercising more, and limiting alcohol. This information is not intended to replace advice given to you by your health care provider. Make sure you discuss any questions you have with your health care provider. Document Revised: 08/17/2018 Document Reviewed: 03/23/2016 Elsevier Patient Education  2020 [employer].  Influenza, Adult Influenza is also called "the flu." It is an infection in the lungs, nose, and throat (respiratory tract). It is caused by a virus. The flu causes symptoms that are similar to symptoms of a cold. It also causes a high fever and body aches. The flu spreads easily from person to person (is contagious). Getting a flu shot (influenza vaccination) every year is the best way to prevent the flu. What are the causes? This condition is caused by the influenza virus. You can get the virus by:  Breathing in droplets that are in the air from the cough or sneeze of a person who has the virus.  Touching something that has the virus on it (is contaminated) and then touching your mouth, nose, or eyes. What increases the risk? Certain things may make you more likely to get the flu. These include:  Not washing your hands often.  Having close contact with many people  during cold and flu season.  Touching your mouth, eyes, or nose without first washing your hands.  Not getting a flu shot every year. You may have a higher risk for the flu, along with serious problems such as a lung infection (pneumonia), if you:  Are older than 65.  Are pregnant.  Have a weakened disease-fighting system (immune system) because of a disease or taking certain medicines.  Have a long-term (chronic) illness,  such as: ? Heart, kidney, or lung disease. ? Diabetes. ? Asthma.  Have a liver disorder.  Are very overweight (morbidly obese).  Have anemia. This is a condition that affects your red blood cells. What are the signs or symptoms? Symptoms usually begin suddenly and last 4-14 days. They may include:  Fever and chills.  Headaches, body aches, or muscle aches.  Sore throat.  Cough.  Runny or stuffy (congested) nose.  Chest discomfort.  Not wanting to eat as much as normal (poor appetite).  Weakness or feeling tired (fatigue).  Dizziness.  Feeling sick to your stomach (nauseous) or throwing up (vomiting). How is this treated? If the flu is found early, you can be treated with medicine that can help reduce how bad the illness is and how long it lasts (antiviral medicine). This may be given by mouth (orally) or through an IV tube. Taking care of yourself at home can help your symptoms get better. Your doctor may suggest:  Taking over-the-counter medicines.  Drinking plenty of fluids. The flu often goes away on its own. If you have very bad symptoms or other problems, you may be treated in a hospital. Follow these instructions at home:     Activity  Rest as needed. Get plenty of sleep.  Stay home from work or school as told by your doctor. ? Do not leave home until you do not have a fever for 24 hours without taking medicine. ? Leave home only to visit your doctor. Eating and drinking  Take an ORS (oral rehydration solution). This is a  drink that is sold at pharmacies and stores.  Drink enough fluid to keep your pee (urine) pale yellow.  Drink clear fluids in small amounts as you are able. Clear fluids include: ? Water. ? Ice chips. ? Fruit juice that has water added (diluted fruit juice). ? Low-calorie sports drinks.  Eat bland, easy-to-digest foods in small amounts as you are able. These foods include: ? Bananas. ? Applesauce. ? Rice. ? Lean meats. ? Toast. ? Crackers.  Do not eat or drink: ? Fluids that have a lot of sugar or caffeine. ? Alcohol. ? Spicy or fatty foods. General instructions  Take over-the-counter and prescription medicines only as told by your doctor.  Use a cool mist humidifier to add moisture to the air in your home. This can make it easier for you to breathe.  Cover your mouth and nose when you cough or sneeze.  Wash your hands with soap and water often, especially after you cough or sneeze. If you cannot use soap and water, use alcohol-based hand sanitizer.  Keep all follow-up visits as told by your doctor. This is important. How is this prevented?   Get a flu shot every year. You may get the flu shot in late summer, fall, or winter. Ask your doctor when you should get your flu shot.  Avoid contact with people who are sick during fall and winter (cold and flu season). Contact a doctor if:  You get new symptoms.  You have: ? Chest pain. ? Watery poop (diarrhea). ? A fever.  Your cough gets worse.  You start to have more mucus.  You feel sick to your stomach.  You throw up. Get help right away if you:  Have shortness of breath.  Have trouble breathing.  Have skin or nails that turn a bluish color.  Have very bad pain or stiffness in your neck.  Get a sudden headache.  Get sudden  pain in your face or ear.  Cannot eat or drink without throwing up. Summary  Influenza ("the flu") is an infection in the lungs, nose, and throat. It is caused by a virus.  Take  over-the-counter and prescription medicines only as told by your doctor.  Getting a flu shot every year is the best way to avoid getting the flu. This information is not intended to replace advice given to you by your health care provider. Make sure you discuss any questions you have with your health care provider. Document Revised: 10/11/2017 Document Reviewed: 10/11/2017 Elsevier Patient Education  [employer].

## 2020-01-27 NOTE — Addendum Note (Signed)
Addended by: Doreen Beam on: 01/27/2020 10:04 AM   Modules accepted: Orders

## 2020-02-07 ENCOUNTER — Ambulatory Visit (INDEPENDENT_AMBULATORY_CARE_PROVIDER_SITE_OTHER): Payer: Medicare Other | Admitting: Psychology

## 2020-02-07 DIAGNOSIS — F3289 Other specified depressive episodes: Secondary | ICD-10-CM | POA: Diagnosis not present

## 2020-02-26 ENCOUNTER — Other Ambulatory Visit: Payer: Self-pay | Admitting: Psychiatry

## 2020-02-26 DIAGNOSIS — F4310 Post-traumatic stress disorder, unspecified: Secondary | ICD-10-CM

## 2020-02-26 DIAGNOSIS — F3161 Bipolar disorder, current episode mixed, mild: Secondary | ICD-10-CM

## 2020-02-27 ENCOUNTER — Ambulatory Visit (INDEPENDENT_AMBULATORY_CARE_PROVIDER_SITE_OTHER): Payer: Medicare Other | Admitting: Psychology

## 2020-02-27 DIAGNOSIS — F3289 Other specified depressive episodes: Secondary | ICD-10-CM

## 2020-03-06 ENCOUNTER — Telehealth (INDEPENDENT_AMBULATORY_CARE_PROVIDER_SITE_OTHER): Payer: Medicare Other | Admitting: Psychiatry

## 2020-03-06 ENCOUNTER — Encounter: Payer: Self-pay | Admitting: Psychiatry

## 2020-03-06 ENCOUNTER — Other Ambulatory Visit: Payer: Self-pay

## 2020-03-06 DIAGNOSIS — F3161 Bipolar disorder, current episode mixed, mild: Secondary | ICD-10-CM | POA: Diagnosis not present

## 2020-03-06 DIAGNOSIS — F3178 Bipolar disorder, in full remission, most recent episode mixed: Secondary | ICD-10-CM | POA: Insufficient documentation

## 2020-03-06 DIAGNOSIS — F1021 Alcohol dependence, in remission: Secondary | ICD-10-CM

## 2020-03-06 DIAGNOSIS — F4310 Post-traumatic stress disorder, unspecified: Secondary | ICD-10-CM | POA: Diagnosis not present

## 2020-03-06 DIAGNOSIS — Z634 Disappearance and death of family member: Secondary | ICD-10-CM | POA: Diagnosis not present

## 2020-03-06 DIAGNOSIS — F1421 Cocaine dependence, in remission: Secondary | ICD-10-CM

## 2020-03-06 DIAGNOSIS — F17200 Nicotine dependence, unspecified, uncomplicated: Secondary | ICD-10-CM | POA: Diagnosis not present

## 2020-03-06 MED ORDER — LATUDA 60 MG PO TABS: 60.0000 mg | ORAL_TABLET | Freq: Every day | ORAL | 1 refills | Status: DC

## 2020-03-06 NOTE — Progress Notes (Signed)
Virtual Visit via Video Note  I connected with [name] on 03/06/20 at  8:30 AM EDT by a video enabled telemedicine application and verified that I am speaking with the correct person using two identifiers.  Location Provider Location : [facility] Patient Location : Home  Participants: Patient , Provider   I discussed the limitations of evaluation and management by telemedicine and the availability of in person appointments. The patient expressed understanding and agreed to proceed.    I discussed the assessment and treatment plan with the patient. The patient was provided an opportunity to ask questions and all were answered. The patient agreed with the plan and demonstrated an understanding of the instructions.   The patient was advised to call back or seek an in-person evaluation if the symptoms worsen or if the condition fails to improve as anticipated.   [name] MD OP Progress Note  03/06/2020 10:53 AM [name]  MRN:  [id number]  Chief Complaint:  Chief Complaint    Follow-up     HPI: Susan [name] is a 59 year old Caucasian female, widowed, disabled, lives in [place], has a history of bipolar disorder, alcohol and cocaine use currently in remission, obstructive sleep apnea on CPAP, hypertension, degenerative disc disease status post total hip replacement, osteoarthritis, hepatitis C was evaluated by telemedicine today.  Patient today reports she is currently going through worsening mood swings.  She reports she had several situational stressors recently with her son who currently has problems with drug abuse.  She reports that has affected her a lot to the point that she felt her mood was up and down a lot.  Patient reports she currently does not have any suicidality.  She denies any perceptual disturbances or homicidality.  She reports sleep is okay.  She is interested in increasing the dosage of Latuda.  She denies any side effects to [place].  She wants to make  sure she manages her mood well and does not want to relapse on alcohol.  She is celebrating 3 years of sobriety this week.  She is proud of herself.  Patient denies any other concerns today. Visit Diagnosis:    ICD-10-CM   1. Bipolar 1 disorder, mixed, mild (HCC)  F31.61 Lurasidone HCl (LATUDA) 60 MG TABS  2. PTSD (post-traumatic stress disorder)  F43.10   3. Bereavement  Z63.4   4. Tobacco use disorder  F17.200   5. Alcohol use disorder, severe, in sustained remission ([place])  F10.21   6. Cocaine use disorder, moderate, in sustained remission ([place])  F14.21     Past Psychiatric History: I have reviewed past psychiatric history from my progress note on 09/27/2019.  Past trials of Seroquel, Cymbalta, Abilify, risperidone.  Past Medical History:  Past Medical History:  Diagnosis Date  . ADD (attention deficit disorder)   . Alcohol abuse   . Anemia   . Anxiety   . Aortic atherosclerosis ([place]) 02/20/2018   Chest CT Sept 2019  . Arthritis    rheumatoid arthritis  . Asthma   . Bipolar disorder ([place])   . Centrilobular emphysema ([place]) 02/20/2018   Chest CT Sept 2019  . Constipation   . Degenerative disc disease at L5-S1 level 09/28/2016   See ortho note May 2018  . Depression    bipolar, hx of suicide attempt  . Drug use   . Dyspnea    with exertion  . GERD (gastroesophageal reflux disease)   . H/O suicide attempt    slit wrists  .  Hepatitis C 06/26/2014  . Hip pain   . History of alcohol abuse   . History of diverticulitis 2013  . History of hepatitis C    HEP "C"--three years ago  . History of MRSA infection 2013  . HLD (hyperlipidemia)   . Hypertension   . Hypothyroidism   . Incisional hernia 11/08/2012  . Knee pain   . 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 Multinodular thyroid   . OSA (obstructive sleep apnea)    managed by Dr. Manuella Ghazi  . Osteoporosis   . Post-traumatic osteoarthritis of right knee 09/24/2015  . Prediabetes   . Recurrent ventral hernia 11/08/2012  . Sleep apnea     Dx  3 years ago. Use C-PAP  . 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 Thyroid disease    Goiter  . Vitamin B12 deficiency   . Vitamin D deficiency disease     Past Surgical History:  Procedure Laterality Date  . BILATERAL SALPINGOOPHORECTOMY     due to abnormal mass  . BREAST BIOPSY Left    neg  . BREAST SURGERY Left 20 yrs ago  . CESAREAN SECTION    . COLONOSCOPY    . COLONOSCOPY WITH PROPOFOL N/A 10/16/2017   Procedure: COLONOSCOPY WITH PROPOFOL;  Surgeon: Jonathon Bellows, MD;  [facility] ENDOSCOPY;  Service: Gastroenterology;  Laterality: N/A;  . HERNIA REPAIR  06/2011, July 2014   Ventral wall repair with Physiomesh  . HERNIA REPAIR     2nd.vental wall repair  . JOINT REPLACEMENT Right    knee  . TONSILLECTOMY    . TOTAL HIP ARTHROPLASTY Left 07/23/2019   Procedure: TOTAL HIP ARTHROPLASTY;  Surgeon: Corky Mull, MD;  Location: [facility] ORS;  Service: Orthopedics;  Laterality: Left;  . TOTAL KNEE ARTHROPLASTY Right 05/10/2016   Procedure: TOTAL KNEE ARTHROPLASTY;  Surgeon: Corky Mull, MD;  Location: [facility] ORS;  Service: Orthopedics;  Laterality: Right;  . TUBAL LIGATION      Family Psychiatric History: I have reviewed family psychiatric history from my progress note on 09/27/2019  Family History:  Family History  Problem Relation Age of Onset  . Alcohol abuse Father   . Heart attack Father   . Arthritis Mother   . Asthma Mother   . Mental illness Mother   . Thyroid disease Mother   . COPD Mother   . Heart disease Mother   . Congestive Heart Failure Mother   . Alcohol abuse Mother   . Eating disorder Mother   . Bipolar disorder Mother   . Arthritis Brother   . Mental illness Brother   . Cancer Brother        non-hodkins lymphoma  . Alcohol abuse Sister   . Drug abuse Sister   . Mental illness Sister   . Mental illness Sister   . Fibromyalgia Sister   . Obesity Sister   . Pneumonia Sister   . Depression Sister   . Mental illness Sister    . Alcohol abuse Sister   . Drug abuse Sister   . Drug abuse Son   . Alcohol abuse Son   . Drug abuse Son   . Alcohol abuse Son   . Drug abuse Daughter   . Diabetes Neg Hx   . Stroke Neg Hx   . Breast cancer Neg Hx     Social History: I have reviewed social history from my progress note on 09/27/2019 Social History   Socioeconomic History  . Marital  status: Widowed    Spouse name: Not on file  . Number of children: 3  . Years of education: Not on file  . Highest education level: Some college, no degree  Occupational History  . Occupation: disabled  Tobacco Use  . Smoking status: Current Every Day Smoker    Packs/day: 1.00    Years: 41.00    Pack years: 41.00    Types: Cigarettes  . Smokeless tobacco: Never Used  Vaping Use  . Vaping Use: Former  . Quit date: 07/15/2017  Substance and Sexual Activity  . Alcohol use: No    Alcohol/week: 0.0 standard drinks    Comment: sober since October 2018  . 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 Sexual activity: Not Currently  Other Topics Concern  . Not on file  Social History Narrative  . Not on file   Social Determinants of Health   Financial Resource Strain:   . Difficulty of Paying Living Expenses: Not on file  Food Insecurity:   . Worried About Charity fundraiser in the Last Year: Not on file  . Ran Out of Food in the Last Year: Not on file  Transportation Needs:   . Lack of Transportation (Medical): Not on file  . Lack of Transportation (Non-Medical): Not on file  Physical Activity:   . Days of Exercise per Week: Not on file  . Minutes of Exercise per Session: Not on file  Stress:   . Feeling of Stress : Not on file  Social Connections:   . Frequency of Communication with Friends and Family: Not on file  . Frequency of Social Gatherings with Friends and Family: Not on file  . Attends Religious Services: Not on file  . Active Member of Clubs or Organizations: Not on file  .  Attends Archivist Meetings: Not on file  . Marital Status: Not on file    Allergies:  Allergies  Allergen Reactions  . Lasix [Furosemide] Other (See Comments)    Electrolyte imbalance    Metabolic Disorder Labs: Lab Results  Component Value Date   HGBA1C 6.3 (A) 11/06/2019   MPG 108 04/08/2019   MPG 103 08/16/2017   No results found for: PROLACTIN Lab Results  Component Value Date   CHOL 143 08/21/2019   TRIG 83 08/21/2019   HDL 47 (L) 08/21/2019   CHOLHDL 3.0 08/21/2019   VLDL 27 10/14/2016   LDLCALC 79 08/21/2019   LDLCALC 79 04/08/2019   Lab Results  Component Value Date   TSH 1.09 10/16/2019   TSH 1.430 07/26/2018    Therapeutic Level Labs: No results found for: LITHIUM No results found for: VALPROATE No components found for:  CBMZ  Current Medications: Current Outpatient Medications  Medication Sig Dispense Refill  . albuterol (VENTOLIN HFA) 108 (90 Base) MCG/ACT inhaler Inhale 2 puffs into the lungs every 4 (four) hours as needed for wheezing or shortness of breath. 18 g 2  . atenolol (TENORMIN) 25 MG tablet Take 1 tablet (25 mg total) by mouth daily. 90 tablet 3  . atorvastatin (LIPITOR) 10 MG tablet Take 1 tablet (10 mg total) by mouth at bedtime. 90 tablet 3  . clindamycin (CLEOCIN T) 1 % external solution Apply to aa's scalp QD PRN 60 mL 3  . clotrimazole (LOTRIMIN) 1 % cream Apply 1 application topically 2 (two) times daily. (Patient not taking: Reported on 01/27/2020) 30 g 0  . cyclobenzaprine (FLEXERIL) 5  MG tablet Take 5 mg by mouth 2 (two) times daily as needed for muscle spasms.     . DULoxetine (CYMBALTA) 60 MG capsule Take 1 capsule (60 mg total) by mouth 2 (two) times daily. 180 capsule 3  . Fluticasone-Umeclidin-Vilant (TRELEGY ELLIPTA) 100-62.5-25 MCG/INH AEPB Inhale 1 puff into the lungs daily. 28 each 11  . gabapentin (NEURONTIN) 300 MG capsule Take 600 mg by mouth 2 (two) times daily.     . hydrochlorothiazide (HYDRODIURIL) 25 MG  tablet Take 1 tablet (25 mg total) by mouth daily. 90 tablet 0  . HYDROcodone-acetaminophen (NORCO/VICODIN) 5-325 MG tablet Take 1 tablet by mouth daily as needed.    . hydrOXYzine (ATARAX/VISTARIL) 25 MG tablet Take 1 tablet (25 mg total) by mouth 3 (three) times daily as needed for anxiety. For breakthrough anxiety attacks (Patient not taking: Reported on 01/27/2020) 30 tablet 1  . ketoconazole (NIZORAL) 2 % shampoo Apply 1 application topically 2 (two) times a week. 120 mL 0  . ketoconazole (NIZORAL) 2 % shampoo Shampoo into the scalp let sit 10 minutes then wash out. Use 3d/wk. 120 mL 3  . loratadine (CLARITIN) 10 MG tablet Take 1 tablet (10 mg total) by mouth daily as needed for allergies. (Patient taking differently: Take 10 mg by mouth daily. ) 90 tablet 3  . losartan (COZAAR) 100 MG tablet Take 1 tablet (100 mg total) by mouth daily. 90 tablet 3  . Lurasidone HCl (LATUDA) 60 MG TABS Take 1 tablet (60 mg total) by mouth daily with supper. 30 tablet 1  . meloxicam (MOBIC) 15 MG tablet Take by mouth.    Marland Kitchen omeprazole (PRILOSEC) 40 MG capsule Take 1 capsule (40 mg total) by mouth daily. 90 capsule 3  . umeclidinium-vilanterol (ANORO ELLIPTA) 62.5-25 MCG/INH AEPB Inhale 1 puff into the lungs daily. (Patient not taking: Reported on 01/27/2020) 60 each 5  . vitamin B-12 (CYANOCOBALAMIN) 1000 MCG tablet Take 1,000 mcg by mouth daily.      No current facility-administered medications for this visit.     Musculoskeletal: Strength & Muscle Tone: UTA Gait & Station: UTA Patient leans: N/A  Psychiatric Specialty Exam: Review of Systems  Psychiatric/Behavioral:       Mood swings  All other systems reviewed and are negative.   There were no vitals taken for this visit.There is no height or weight on file to calculate BMI.  General Appearance: Casual  Eye Contact:  Fair  Speech:  Clear and Coherent  Volume:  Normal  Mood:  Mood swings  Affect:  Congruent  Thought Process:  Goal Directed and  Descriptions of Associations: Intact  Orientation:  Full (Time, Place, and Person)  Thought Content: Logical   Suicidal Thoughts:  No  Homicidal Thoughts:  No  Memory:  Immediate;   Fair Recent;   Fair Remote;   Fair  Judgement:  Fair  Insight:  Fair  Psychomotor Activity:  Normal  Concentration:  Concentration: Fair and Attention Span: Fair  Recall:  [employer] of Knowledge: Fair  Language: Fair  Akathisia:  No  Handed:  Right  AIMS (if indicated): UTA  Assets:  Communication Skills Desire for Improvement Housing Social Support  ADL's:  Intact  Cognition: WNL  Sleep:  Fair   Screenings: GAD-7     Office Visit from 11/06/2019 in [facility] Office Visit from 09/06/2018 in [facility]  Total GAD-7 Score 21 14    PHQ2-9     Office Visit from  11/06/2019 in [facility] Office Visit from 10/16/2019 in [facility] Office Visit from 08/21/2019 in [facility] Office Visit from 04/08/2019 in [facility] Office Visit from 01/04/2019 in [facility]  PHQ-2 Total Score 6 6 6 1 3   PHQ-9 Total Score 13 23 13 1 9        Assessment and Plan: BRADY PLANT is a 59 year old Caucasian female who has a history of bipolar disorder, degenerative disc disease, status post total hip replacement, essential hypertension, osteoarthritis, OSA on CPAP, gastroesophageal reflux disease, hepatitis C was evaluated by telemedicine today.  She is biologically predisposed given her family history of mental health problems, her own history of substance abuse problems and trauma.  Patient with psychosocial stressors of recent death in the family, children who struggle with substance abuse problems, current pandemic, loss of her dog, health issues.  Patient is currently struggling with worsening mood symptoms and will benefit from following medication changes.  Plan Bipolar disorder  mixed mild-unstable Increase Latuda to 60 mg p.o. daily with supper Cymbalta as prescribed  PTSD-improving Cymbalta 60 mg p.o. twice daily Hydroxyzine 25 mg p.o. 3 times daily as needed for breakthrough anxiety. Continue CBT with therapist Mr. Mostafa.  Delirium-improving Continue grief counseling  Tobacco use disorder-improving Provided counseling  Alcohol and cocaine use disorder in remission. She is currently celebrating 3 years of sobriety.  She is active in [name].  Follow-up in clinic in 4 weeks or sooner if needed.  I have spent atleast 20 minutes face to face by video with patient today. More than 50 % of the time was spent for preparing to see the patient ( e.g., review of test, records ),  ordering medications and test ,psychoeducation and supportive psychotherapy and care coordination,as well as documenting clinical information in electronic health record. This note was generated in part or whole with voice recognition software. Voice recognition is usually quite accurate but there are transcription errors that can and very often do occur. I apologize for any typographical errors that were not detected and corrected.        Ursula Alert, MD 03/06/2020, 10:53 AM

## 2020-03-17 ENCOUNTER — Ambulatory Visit: Payer: Medicare Other | Admitting: Psychology

## 2020-03-18 ENCOUNTER — Ambulatory Visit (INDEPENDENT_AMBULATORY_CARE_PROVIDER_SITE_OTHER): Payer: Medicare Other | Admitting: Adult Health

## 2020-03-18 ENCOUNTER — Ambulatory Visit (INDEPENDENT_AMBULATORY_CARE_PROVIDER_SITE_OTHER): Payer: Medicare Other | Admitting: Psychology

## 2020-03-18 ENCOUNTER — Other Ambulatory Visit: Payer: Self-pay

## 2020-03-18 ENCOUNTER — Encounter: Payer: Self-pay | Admitting: Adult Health

## 2020-03-18 VITALS — BP 155/81 | HR 77 | Temp 98.0°F | Resp 16 | Wt 243.2 lb

## 2020-03-18 DIAGNOSIS — F17209 Nicotine dependence, unspecified, with unspecified nicotine-induced disorders: Secondary | ICD-10-CM

## 2020-03-18 DIAGNOSIS — R059 Cough, unspecified: Secondary | ICD-10-CM | POA: Diagnosis not present

## 2020-03-18 DIAGNOSIS — F3289 Other specified depressive episodes: Secondary | ICD-10-CM | POA: Diagnosis not present

## 2020-03-18 DIAGNOSIS — R635 Abnormal weight gain: Secondary | ICD-10-CM | POA: Diagnosis not present

## 2020-03-18 DIAGNOSIS — I10 Essential (primary) hypertension: Secondary | ICD-10-CM

## 2020-03-18 NOTE — Patient Instructions (Addendum)
Call to schedule your screening mammogram. Your orders have been placed for your exam.  Let our office know if you have questions, concerns, or any difficulty scheduling.  If normal results then yearly screening mammograms are recommended unless you notice  Changes in your breast then you should schedule a follow up office visit. If abnormal results  Further imaging will be warranted and sooner follow up as determined by the radiologist at the [facility].   [facility] at [place], [place] [address]  Main: [phone]     Fat and Cholesterol Restricted Eating Plan Getting too much fat and cholesterol in your diet may cause health problems. Choosing the right foods helps keep your fat and cholesterol at normal levels. This can keep you from getting certain diseases. Your doctor may recommend an eating plan that includes:  Total fat: ______% or less of total calories a day.  Saturated fat: ______% or less of total calories a day.  Cholesterol: less than _________mg a day.  Fiber: ______g a day. What are tips for following this plan? Meal planning  At meals, divide your plate into four equal parts: ? Fill one-half of your plate with vegetables and green salads. ? Fill one-fourth of your plate with whole grains. ? Fill one-fourth of your plate with low-fat (lean) protein foods.  Eat fish that is high in omega-3 fats at least two times a week. This includes mackerel, tuna, sardines, and salmon.  Eat foods that are high in fiber, such as whole grains, beans, apples, broccoli, carrots, peas, and barley. General tips   Work with your doctor to lose weight if you need to.  Avoid: ? Foods with added sugar. ? Fried foods. ? Foods with partially hydrogenated oils.  Limit alcohol intake to no more than 1 drink a day for nonpregnant women and 2 drinks a day for men. One drink equals 12 oz of beer, 5 oz of wine, or 1 oz of hard  liquor. Reading food labels  Check food labels for: ? Trans fats. ? Partially hydrogenated oils. ? Saturated fat (g) in each serving. ? Cholesterol (mg) in each serving. ? Fiber (g) in each serving.  Choose foods with healthy fats, such as: ? Monounsaturated fats. ? Polyunsaturated fats. ? Omega-3 fats.  Choose grain products that have whole grains. Look for the word "whole" as the first word in the ingredient list. Cooking  Cook foods using low-fat methods. These include baking, boiling, grilling, and broiling.  Eat more home-cooked foods. Eat at restaurants and buffets less often.  Avoid cooking using saturated fats, such as butter, cream, palm oil, palm kernel oil, and coconut oil. Recommended foods  Fruits  All fresh, canned (in natural juice), or frozen fruits. Vegetables  Fresh or frozen vegetables (raw, steamed, roasted, or grilled). Green salads. Grains  Whole grains, such as whole wheat or whole grain breads, crackers, cereals, and pasta. Unsweetened oatmeal, bulgur, barley, quinoa, or brown rice. Corn or whole wheat flour tortillas. Meats and other protein foods  Ground beef (85% or leaner), grass-fed beef, or beef trimmed of fat. Skinless chicken or [place]. Ground chicken or [place]. Pork trimmed of fat. All fish and seafood. Egg whites. Dried beans, peas, or lentils. Unsalted nuts or seeds. Unsalted canned beans. Nut butters without added sugar or oil. Dairy  Low-fat or nonfat dairy products, such as skim or 1% milk, 2% or reduced-fat cheeses, low-fat and fat-free ricotta or cottage cheese, or plain low-fat  and nonfat yogurt. Fats and oils  Tub margarine without trans fats. Light or reduced-fat mayonnaise and salad dressings. Avocado. Olive, canola, sesame, or safflower oils. The items listed above may not be a complete list of foods and beverages you can eat. Contact a dietitian for more information. Foods to avoid Fruits  Canned fruit in heavy syrup. Fruit  in cream or butter sauce. Fried fruit. Vegetables  Vegetables cooked in cheese, cream, or butter sauce. Fried vegetables. Grains  White bread. White pasta. White rice. Cornbread. Bagels, pastries, and croissants. Crackers and snack foods that contain trans fat and hydrogenated oils. Meats and other protein foods  Fatty cuts of meat. Ribs, chicken wings, bacon, sausage, bologna, salami, chitterlings, fatback, hot dogs, bratwurst, and packaged lunch meats. Liver and organ meats. Whole eggs and egg yolks. Chicken and [place] with skin. Fried meat. Dairy  Whole or 2% milk, cream, half-and-half, and cream cheese. Whole milk cheeses. Whole-fat or sweetened yogurt. Full-fat cheeses. Nondairy creamers and whipped toppings. Processed cheese, cheese spreads, and cheese curds. Beverages  Alcohol. Sugar-sweetened drinks such as sodas, lemonade, and fruit drinks. Fats and oils  Butter, stick margarine, lard, shortening, ghee, or bacon fat. Coconut, palm kernel, and palm oils. Sweets and desserts  Corn syrup, sugars, honey, and molasses. Candy. Jam and jelly. Syrup. Sweetened cereals. Cookies, pies, cakes, donuts, muffins, and ice cream. The items listed above may not be a complete list of foods and beverages you should avoid. Contact a dietitian for more information. Summary  Choosing the right foods helps keep your fat and cholesterol at normal levels. This can keep you from getting certain diseases.  At meals, fill one-half of your plate with vegetables and green salads.  Eat high-fiber foods, like whole grains, beans, apples, carrots, peas, and barley.  Limit added sugar, saturated fats, alcohol, and fried foods. This information is not intended to replace advice given to you by your health care provider. Make sure you discuss any questions you have with your health care provider. Document Revised: 12/27/2017 Document Reviewed: 01/10/2017 Elsevier Patient Education  [place] refers to food and lifestyle choices that are based on the traditions of countries located on the [employer]. This way of eating has been shown to help prevent certain conditions and improve outcomes for people who have chronic diseases, like kidney disease and heart disease. What are tips for following this plan? Lifestyle  Cook and eat meals together with your family, when possible.  Drink enough fluid to keep your urine clear or pale yellow.  Be physically active every day. This includes: ? Aerobic exercise like running or swimming. ? Leisure activities like gardening, walking, or housework.  Get 7-8 hours of sleep each night.  If recommended by your health care provider, drink red wine in moderation. This means 1 glass a day for nonpregnant women and 2 glasses a day for men. A glass of wine equals 5 oz (150 mL). Reading food labels   Check the serving size of packaged foods. For foods such as rice and pasta, the serving size refers to the amount of cooked product, not dry.  Check the total fat in packaged foods. Avoid foods that have saturated fat or trans fats.  Check the ingredients list for added sugars, such as corn syrup. Shopping  At the grocery store, buy most of your food from the areas near the walls of the store. This includes: ? Fresh fruits and vegetables (produce). ? Grains,  beans, nuts, and seeds. Some of these may be available in unpackaged forms or large amounts (in bulk). ? Fresh seafood. ? Poultry and eggs. ? Low-fat dairy products.  Buy whole ingredients instead of prepackaged foods.  Buy fresh fruits and vegetables in-season from local farmers markets.  Buy frozen fruits and vegetables in resealable bags.  If you do not have access to quality fresh seafood, buy precooked frozen shrimp or canned fish, such as tuna, salmon, or sardines.  Buy small amounts of raw or cooked vegetables, salads, or olives  from the deli or salad bar at your store.  Stock your pantry so you always have certain foods on hand, such as olive oil, canned tuna, canned tomatoes, rice, pasta, and beans. Cooking  Cook foods with extra-virgin olive oil instead of using butter or other vegetable oils.  Have meat as a side dish, and have vegetables or grains as your main dish. This means having meat in small portions or adding small amounts of meat to foods like pasta or stew.  Use beans or vegetables instead of meat in common dishes like chili or lasagna.  Experiment with different cooking methods. Try roasting or broiling vegetables instead of steaming or sauteing them.  Add frozen vegetables to soups, stews, pasta, or rice.  Add nuts or seeds for added healthy fat at each meal. You can add these to yogurt, salads, or vegetable dishes.  Marinate fish or vegetables using olive oil, lemon juice, garlic, and fresh herbs. Meal planning   Plan to eat 1 vegetarian meal one day each week. Try to work up to 2 vegetarian meals, if possible.  Eat seafood 2 or more times a week.  Have healthy snacks readily available, such as: ? Vegetable sticks with hummus. ? [place] yogurt. ? Fruit and nut trail mix.  Eat balanced meals throughout the week. This includes: ? Fruit: 2-3 servings a day ? Vegetables: 4-5 servings a day ? Low-fat dairy: 2 servings a day ? Fish, poultry, or lean meat: 1 serving a day ? Beans and legumes: 2 or more servings a week ? Nuts and seeds: 1-2 servings a day ? Whole grains: 6-8 servings a day ? Extra-virgin olive oil: 3-4 servings a day  Limit red meat and sweets to only a few servings a month What are my food choices?  Mediterranean diet ? Recommended  Grains: Whole-grain pasta. Brown rice. Bulgar wheat. Polenta. Couscous. Whole-wheat bread. [place].  Vegetables: Artichokes. Beets. Broccoli. Cabbage. Carrots. Eggplant. Green beans. Chard. Kale. Spinach. Onions. Leeks. Peas.  Squash. Tomatoes. Peppers. Radishes.  Fruits: Apples. Apricots. Avocado. Berries. Bananas. Cherries. Dates. Figs. Grapes. Lemons. Melon. Oranges. Peaches. Plums. Pomegranate.  Meats and other protein foods: Beans. Almonds. Sunflower seeds. Pine nuts. Peanuts. [place]. Salmon. Scallops. Shrimp. [place]. Tilapia. Clams. Oysters. Eggs.  Dairy: Low-fat milk. Cheese. Greek yogurt.  Beverages: Water. Red wine. Herbal tea.  Fats and oils: Extra virgin olive oil. Avocado oil. Grape seed oil.  Sweets and desserts: [place] yogurt with honey. Baked apples. Poached pears. Trail mix.  Seasoning and other foods: Basil. Cilantro. Coriander. Cumin. Mint. Parsley. Sage. Rosemary. Tarragon. Garlic. Oregano. Thyme. [name]. Balsalmic vinegar. Tahini. Hummus. Tomato sauce. Olives. Mushrooms. ? Limit these  Grains: Prepackaged pasta or rice dishes. Prepackaged cereal with added sugar.  Vegetables: Deep fried potatoes (french fries).  Fruits: Fruit canned in syrup.  Meats and other protein foods: Beef. Pork. Lamb. Poultry with skin. Hot dogs. [place] Salines.  Dairy: Ice cream. Sour cream. Whole milk.  Beverages: Juice.  Sugar-sweetened soft drinks. Beer. Liquor and spirits.  Fats and oils: Butter. Canola oil. Vegetable oil. Beef fat (tallow). Lard.  Sweets and desserts: Cookies. Cakes. Pies. Candy.  Seasoning and other foods: Mayonnaise. Premade sauces and marinades. The items listed may not be a complete list. Talk with your dietitian about what dietary choices are right for you. Summary  The Mediterranean diet includes both food and lifestyle choices.  Eat a variety of fresh fruits and vegetables, beans, nuts, seeds, and whole grains.  Limit the amount of red meat and sweets that you eat.  Talk with your health care provider about whether it is safe for you to drink red wine in moderation. This means 1 glass a day for nonpregnant women and 2 glasses a day for men. A glass of wine equals 5 oz (150 mL). This  information is not intended to replace advice given to you by your health care provider. Make sure you discuss any questions you have with your health care provider. Document Revised: 12/24/2015 Document Reviewed: 12/17/2015 Elsevier Patient Education  [place].

## 2020-03-18 NOTE — Progress Notes (Signed)
Established patient visit   Patient: Susan [name]   DOB: Mar 14, 1961   59 y.o. Female  MRN: [id number] Visit Date: 03/18/2020  Today's healthcare provider: Marcille Buffy, FNP   Chief Complaint  Patient presents with  . Obesity   Subjective    HPI  Weight Management  Patient presents in office today to address obesity and discuss options to help with weight loss. Patient reports that a month ago she weighed 232lbs, patient states that previously she was able to lose weight in past with diet and exercise.   Patient states that she has tried weight management program [facility] and has previously tried medication to help with weight loss such as Topomax and [name].  She is contemplating if she wants surgery.  She is not exercising and she has been a little overwhelmed with holidays, she has a Clinical biochemist and will pick this back up in January.   She has been eating a lot of ice cream.  Sweet tea was stopped. She is doing soft drinks.   Her son overdosed 1 year ago, mom has deceased 7 years ago  and husband deceased 4 years ago.  She saw psychiatry this morning, Latuda was increased last visit. She feels thi sis helping her.  Patient  denies any fever, body aches,chills, rash, chest pain, shortness of breath, nausea, vomiting, or diarrhea.   Denies dizziness, lightheadedness, pre syncopal or syncopal episodes.     Patient Active Problem List   Diagnosis Date Noted  . Bipolar disorder, in full remission, most recent episode mixed ([place]) 03/06/2020  . Need for immunization against influenza 01/27/2020  . Encounter for screening mammogram for malignant neoplasm of breast 01/27/2020  . Fatigue 01/27/2020  . Morbid obesity ([place]) 01/27/2020  . Osteoporosis 11/06/2019  . Bipolar disorder, current episode mixed, mild ([place]) 10/29/2019  . PTSD (post-traumatic stress disorder) 09/27/2019  . Bereavement 09/27/2019  . Cocaine use disorder, moderate,  in sustained remission ([place]) 09/27/2019  . Status post total hip replacement, left 07/23/2019  . Primary osteoarthritis of left hip 06/14/2019  . Insulin resistance 02/18/2019  . Prediabetes 12/31/2018  . Depression 10/23/2018  . Class 3 severe obesity with serious comorbidity and body mass index (BMI) of 40.0 to 44.9 in adult ([place]) 10/23/2018  . Allergic rhinitis 08/23/2018  . Aortic atherosclerosis ([place]) 02/20/2018  . Centrilobular emphysema ([place]) 02/20/2018  . Dyslipidemia 03/28/2017  . Degenerative disc disease at L5-S1 level 09/28/2016  . Tobacco use disorder 04/21/2016  . GERD (gastroesophageal reflux disease) 12/17/2015  . Chronic back pain 10/09/2015  . Post-traumatic osteoarthritis of right knee 09/24/2015  . Alcohol use disorder, severe, in sustained remission ([place]) 04/13/2015  . Vitamin D deficiency 04/09/2015  . Vitamin B12 deficiency 04/09/2015  . OSA on CPAP 04/09/2015  . Subclinical hyperthyroidism 06/26/2014  . Toxic multinodular goiter 06/26/2014  . Hypertension 06/26/2014  . Bipolar I disorder ([place]) 06/26/2014  . Lumbar radiculitis 10/03/2013  . Diverticulosis 08/24/2013   Past Medical History:  Diagnosis Date  . ADD (attention deficit disorder)   . Alcohol abuse   . Anemia   . Anxiety   . Aortic atherosclerosis ([place]) 02/20/2018   Chest CT Sept 2019  . Arthritis    rheumatoid arthritis  . Asthma   . Bipolar disorder ([place])   . Centrilobular emphysema ([place]) 02/20/2018   Chest CT Sept 2019  . Constipation   . Degenerative disc disease at L5-S1 level 09/28/2016   See ortho note  May 2018  . Depression    bipolar, hx of suicide attempt  . Drug use   . Dyspnea    with exertion  . GERD (gastroesophageal reflux disease)   . H/O suicide attempt    slit wrists  . Hepatitis C 06/26/2014  . Hip pain   . History of alcohol abuse   . History of diverticulitis 2013  . History of hepatitis C    HEP "C"--three years ago  . History of MRSA infection 2013  . HLD  (hyperlipidemia)   . Hypertension   . Hypothyroidism   . Incisional hernia 11/08/2012  . Knee pain   . 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 Multinodular thyroid   . OSA (obstructive sleep apnea)    managed by Dr. Manuella Ghazi  . Osteoporosis   . Post-traumatic osteoarthritis of right knee 09/24/2015  . Prediabetes   . Recurrent ventral hernia 11/08/2012  . Sleep apnea     Dx 3 years ago. Use C-PAP  . 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 Thyroid disease    Goiter  . Vitamin B12 deficiency   . Vitamin D deficiency disease    Allergies  Allergen Reactions  . Lasix [Furosemide] Other (See Comments)    Electrolyte imbalance       Medications: Outpatient Medications Prior to Visit  Medication Sig  . albuterol (VENTOLIN HFA) 108 (90 Base) MCG/ACT inhaler Inhale 2 puffs into the lungs every 4 (four) hours as needed for wheezing or shortness of breath.  Marland Kitchen atenolol (TENORMIN) 25 MG tablet Take 1 tablet (25 mg total) by mouth daily.  Marland Kitchen atorvastatin (LIPITOR) 10 MG tablet Take 1 tablet (10 mg total) by mouth at bedtime.  . clindamycin (CLEOCIN T) 1 % external solution Apply to aa's scalp QD PRN  . clotrimazole (LOTRIMIN) 1 % cream Apply 1 application topically 2 (two) times daily. (Patient not taking: Reported on 01/27/2020)  . cyclobenzaprine (FLEXERIL) 5 MG tablet Take 5 mg by mouth 2 (two) times daily as needed for muscle spasms.   . DULoxetine (CYMBALTA) 60 MG capsule Take 1 capsule (60 mg total) by mouth 2 (two) times daily.  . Fluticasone-Umeclidin-Vilant (TRELEGY ELLIPTA) 100-62.5-25 MCG/INH AEPB Inhale 1 puff into the lungs daily.  Marland Kitchen gabapentin (NEURONTIN) 300 MG capsule Take 600 mg by mouth 2 (two) times daily.   . hydrochlorothiazide (HYDRODIURIL) 25 MG tablet Take 1 tablet (25 mg total) by mouth daily.  . hydrOXYzine (ATARAX/VISTARIL) 25 MG tablet Take 1 tablet (25 mg total) by mouth 3 (three) times daily as needed for anxiety. For breakthrough anxiety attacks (Patient not  taking: Reported on 01/27/2020)  . ketoconazole (NIZORAL) 2 % shampoo Shampoo into the scalp let sit 10 minutes then wash out. Use 3d/wk.  Marland Kitchen loratadine (CLARITIN) 10 MG tablet Take 1 tablet (10 mg total) by mouth daily as needed for allergies. (Patient taking differently: Take 10 mg by mouth daily. )  . losartan (COZAAR) 100 MG tablet Take 1 tablet (100 mg total) by mouth daily.  . Lurasidone HCl (LATUDA) 60 MG TABS Take 1 tablet (60 mg total) by mouth daily with supper.  . meloxicam (MOBIC) 15 MG tablet Take by mouth.  Marland Kitchen omeprazole (PRILOSEC) 40 MG capsule Take 1 capsule (40 mg total) by mouth daily.  Marland Kitchen umeclidinium-vilanterol (ANORO ELLIPTA) 62.5-25 MCG/INH AEPB Inhale 1 puff into the lungs daily. (Patient not taking: Reported on 01/27/2020)  . vitamin B-12 (CYANOCOBALAMIN) 1000 MCG tablet Take 1,000 mcg by mouth  daily.   . [DISCONTINUED] HYDROcodone-acetaminophen (NORCO/VICODIN) 5-325 MG tablet Take 1 tablet by mouth daily as needed.  . [DISCONTINUED] ketoconazole (NIZORAL) 2 % shampoo Apply 1 application topically 2 (two) times a week.   No facility-administered medications prior to visit.    Review of Systems  Constitutional: Positive for appetite change (increased ) and fatigue. Negative for activity change, chills, diaphoresis, fever and unexpected weight change.  HENT: Negative.   Respiratory: Negative.   Cardiovascular: Negative.   Gastrointestinal: Negative.   Genitourinary: Negative.   Musculoskeletal: Negative.   Skin: Negative.   Neurological: Negative.   Hematological: Negative.   Psychiatric/Behavioral: Positive for decreased concentration. Negative for agitation, behavioral problems, confusion, dysphoric mood, hallucinations, self-injury, sleep disturbance and suicidal ideas. The patient is nervous/anxious. The patient is not hyperactive.     Last CBC Lab Results  Component Value Date   WBC 12.0 (H) 07/24/2019   HGB 11.9 (L) 07/24/2019   HCT 36.6 07/24/2019   MCV 96.6  07/24/2019   MCH 31.4 07/24/2019   RDW 11.9 07/24/2019   PLT 167 89/38/1017   Last metabolic panel Lab Results  Component Value Date   GLUCOSE 179 (H) 10/16/2019   NA 138 10/16/2019   K 4.0 10/16/2019   CL 106 10/16/2019   CO2 22 10/16/2019   BUN 13 10/16/2019   CREATININE 0.92 10/16/2019   GFRNONAA 69 10/16/2019   GFRAA 80 10/16/2019   CALCIUM 9.0 10/16/2019   PROT 6.2 10/16/2019   ALBUMIN 4.0 12/17/2018   LABGLOB 2.3 07/26/2018   AGRATIO 2.0 07/26/2018   BILITOT 0.3 10/16/2019   ALKPHOS 85 12/17/2018   AST 15 10/16/2019   ALT 15 10/16/2019   ANIONGAP 9 07/24/2019   Last lipids Lab Results  Component Value Date   CHOL 143 08/21/2019   HDL 47 (L) 08/21/2019   LDLCALC 79 08/21/2019   TRIG 83 08/21/2019   CHOLHDL 3.0 08/21/2019   Last hemoglobin A1c Lab Results  Component Value Date   HGBA1C 6.3 (A) 11/06/2019   Last thyroid functions Lab Results  Component Value Date   TSH 1.09 10/16/2019   T3TOTAL 177 07/26/2018   Last vitamin D Lab Results  Component Value Date   VD25OH 21.8 (L) 01/16/2019   Last vitamin B12 and Folate Lab Results  Component Value Date   VITAMINB12 1,442 (H) 07/26/2018   FOLATE 6.5 07/26/2018      Objective    BP (!) 155/81   Pulse 77   Temp 98 F (36.7 C) (Oral)   Resp 16   Wt 243 lb 3.2 oz (110.3 kg)   SpO2 100%   BMI 44.48 kg/m  BP Readings from Last 3 Encounters:  03/18/20 (!) 155/81  01/27/20 134/70  12/25/19 (!) 144/88   Wt Readings from Last 3 Encounters:  03/18/20 243 lb 3.2 oz (110.3 kg)  01/27/20 235 lb (106.6 kg)  12/25/19 240 lb 12.8 oz (109.2 kg)   SpO2 Readings from Last 3 Encounters:  03/18/20 100%  01/27/20 98%  12/25/19 96%      Physical Exam Constitutional:      General: She is not in acute distress.    Appearance: Normal appearance. She is obese. She is not ill-appearing, toxic-appearing or diaphoretic.  HENT:     Right Ear: External ear normal.     Left Ear: External ear normal.      Nose: Nose normal.     Mouth/Throat:     Mouth: Mucous membranes are moist.  Eyes:  General: No scleral icterus.    Conjunctiva/sclera: Conjunctivae normal.  Cardiovascular:     Rate and Rhythm: Normal rate and regular rhythm.     Pulses: Normal pulses.     Heart sounds: Normal heart sounds. No murmur heard.  No friction rub. No gallop.   Pulmonary:     Effort: Pulmonary effort is normal. No respiratory distress.     Breath sounds: No stridor, decreased air movement or transmitted upper airway sounds. Examination of the left-lower field reveals wheezing. Wheezing present. No rhonchi or rales.     Comments: Mild expiratory wheeze intermittent heard only with auscultation. She reports mild occasional cough over 2 months, no productive sputum or hemoptysis. Chest:     Chest wall: No tenderness.  Abdominal:     General: There is no distension.     Palpations: Abdomen is soft. There is no mass.     Tenderness: There is no abdominal tenderness. There is no right CVA tenderness, left CVA tenderness, guarding or rebound.     Hernia: No hernia is present.  Musculoskeletal:        General: Normal range of motion.     Cervical back: Normal range of motion and neck supple. No rigidity or tenderness.  Lymphadenopathy:     Cervical: No cervical adenopathy.  Skin:    General: Skin is warm.     Findings: No erythema or rash.  Neurological:     Mental Status: She is alert and oriented to person, place, and time.  Psychiatric:        Mood and Affect: Mood normal.        Behavior: Behavior normal.        Thought Content: Thought content normal.        Judgment: Judgment normal.      No results found for any visits on 03/18/20.  Assessment & Plan     Weight gain - Plan: Amb Referral to Bariatric Surgery  Essential hypertension - Plan: Amb Referral to Bariatric Surgery  Morbid obesity ([place]) - Plan: Amb Referral to Bariatric Surgery  Cough - Plan: DG Chest 2 View  Tobacco use  disorder, continuous   Lab orders are in - she is overdue for labs and aware she can walk uinto lab.   Orders Placed This Encounter  Procedures  . DG Chest 2 View  . Amb Referral to Bariatric Surgery  she would like to go to bariatric clinic denies referral to nutritionist. .   She can not take Wellbutrin she reports made her " deathly sick" years ago.  Smoking cessation instruction/counseling given:  counseled patient on the dangers of tobacco use, advised patient to stop smoking, and reviewed strategies to maximize success  Needs mammogram- number for [facility] given again.  Return in about 1 month (around 04/17/2020), or if symptoms worsen or fail to improve, for at any time for any worsening symptoms, Go to Emergency room/ urgent care if worse.     Keep psychiatry evaluations and counseling.  Patient verbalized understanding of all instructions given and denies any further questions at this time.   Red Flags discussed. The patient was given clear instructions to go to ER or return to medical center if any red flags develop, symptoms do not improve, worsen or new problems develop. They verbalized understanding.     Marcille Buffy, [place] [phone] (phone) [phone] (fax)  [place]

## 2020-03-20 ENCOUNTER — Ambulatory Visit
Admission: RE | Admit: 2020-03-20 | Discharge: 2020-03-20 | Disposition: A | Discharge status: Home / Self Care | Payer: Medicare Other | Attending: Adult Health | Admitting: Adult Health

## 2020-03-20 ENCOUNTER — Ambulatory Visit
Admission: RE | Admit: 2020-03-20 | Discharge: 2020-03-20 | Disposition: A | Discharge status: Home / Self Care | Payer: Medicare Other | Source: Ambulatory Visit | Attending: Adult Health | Admitting: Adult Health

## 2020-03-20 ENCOUNTER — Other Ambulatory Visit: Payer: Self-pay

## 2020-03-20 DIAGNOSIS — R059 Cough, unspecified: Secondary | ICD-10-CM | POA: Insufficient documentation

## 2020-03-20 DIAGNOSIS — R5383 Other fatigue: Secondary | ICD-10-CM

## 2020-03-20 DIAGNOSIS — I10 Essential (primary) hypertension: Secondary | ICD-10-CM

## 2020-03-20 DIAGNOSIS — R7303 Prediabetes: Secondary | ICD-10-CM

## 2020-03-20 NOTE — Progress Notes (Signed)
No acute infection. Degenerative changes of thoracic spine is noted.

## 2020-03-21 LAB — COMPREHENSIVE METABOLIC PANEL
ALT: 16 IU/L (ref 0–32)
AST: 13 IU/L (ref 0–40)
Albumin/Globulin Ratio: 1.9 (ref 1.2–2.2)
Albumin: 4.3 g/dL (ref 3.8–4.9)
Alkaline Phosphatase: 92 IU/L (ref 44–121)
BUN/Creatinine Ratio: 10 (ref 9–23)
BUN: 11 mg/dL (ref 6–24)
Bilirubin Total: 0.3 mg/dL (ref 0.0–1.2)
CO2: 25 mmol/L (ref 20–29)
Calcium: 9.5 mg/dL (ref 8.7–10.2)
Chloride: 91 mmol/L — ABNORMAL LOW (ref 96–106)
Creatinine, Ser: 1.07 mg/dL — ABNORMAL HIGH (ref 0.57–1.00)
GFR calc Af Amer: 66 mL/min/{1.73_m2} (ref >59)
GFR calc non Af Amer: 57 mL/min/{1.73_m2} — ABNORMAL LOW (ref >59)
Globulin, Total: 2.3 g/dL (ref 1.5–4.5)
Glucose: 100 mg/dL — ABNORMAL HIGH (ref 65–99)
Potassium: 4.1 mmol/L (ref 3.5–5.2)
Sodium: 131 mmol/L — ABNORMAL LOW (ref 134–144)
Total Protein: 6.6 g/dL (ref 6.0–8.5)

## 2020-03-21 LAB — CBC WITH DIFFERENTIAL/PLATELET
Basophils Absolute: 0.1 10*3/uL (ref 0.0–0.2)
Basos: 1 %
EOS (ABSOLUTE): 0.1 10*3/uL (ref 0.0–0.4)
Eos: 1 %
Hematocrit: 40.4 % (ref 34.0–46.6)
Hemoglobin: 14.2 g/dL (ref 11.1–15.9)
Immature Grans (Abs): 0 10*3/uL (ref 0.0–0.1)
Immature Granulocytes: 0 %
Lymphocytes Absolute: 2.9 10*3/uL (ref 0.7–3.1)
Lymphs: 28 %
MCH: 32.6 pg (ref 26.6–33.0)
MCHC: 35.1 g/dL (ref 31.5–35.7)
MCV: 93 fL (ref 79–97)
Monocytes Absolute: 0.7 10*3/uL (ref 0.1–0.9)
Monocytes: 6 %
Neutrophils Absolute: 6.5 10*3/uL (ref 1.4–7.0)
Neutrophils: 64 %
Platelets: 247 10*3/uL (ref 150–450)
RBC: 4.35 x10E6/uL (ref 3.77–5.28)
RDW: 12.4 % (ref 11.7–15.4)
WBC: 10.3 10*3/uL (ref 3.4–10.8)

## 2020-03-21 LAB — VITAMIN D 25 HYDROXY (VIT D DEFICIENCY, FRACTURES): Vit D, 25-Hydroxy: 12.6 ng/mL — ABNORMAL LOW (ref 30.0–100.0)

## 2020-03-21 LAB — HEMOGLOBIN A1C
Est. average glucose Bld gHb Est-mCnc: 131 mg/dL
Hgb A1c MFr Bld: 6.2 % — ABNORMAL HIGH (ref 4.8–5.6)

## 2020-03-24 ENCOUNTER — Other Ambulatory Visit: Payer: Self-pay | Admitting: Adult Health

## 2020-03-24 DIAGNOSIS — E559 Vitamin D deficiency, unspecified: Secondary | ICD-10-CM

## 2020-03-24 MED ORDER — VITAMIN D (ERGOCALCIFEROL) 1.25 MG (50000 UNIT) PO CAPS: 50000.0000 [IU] | ORAL_CAPSULE | ORAL | 0 refills | Status: DC

## 2020-03-24 NOTE — Progress Notes (Signed)
CBC within normal limits now.  Glucose mild elevation - ok to add on hemoglobin A1C.  Sodium is slightly low, increase sodium for a few days in diet   Recheck fasting CMP in 3 weeks advised.   Will send vitamin D prescription she is very low in vitamin D , Advise to take one tablet once a week as prescribed. Recheck vitamin d lab 1- 2 weeks after completing.

## 2020-03-24 NOTE — Progress Notes (Signed)
CBC within normal limits now.  Glucose mild elevation - Hemoglobin A1c still prediabetic recommend lifestyle and dietary changes. Sodium is slightly low, increase sodium for a few days in diet   Recheck fasting CMP in 3 weeks advised.   Will send vitamin D prescription she is very low in vitamin D , Advise to take one tablet once a week as prescribed. Recheck vitamin d lab 1- 2 weeks after completing.

## 2020-03-25 ENCOUNTER — Telehealth: Payer: Self-pay

## 2020-03-25 NOTE — Telephone Encounter (Signed)
-----   Message from Doreen Beam, [place] sent at 03/24/2020  1:20 PM EST ----- CBC within normal limits now.  Glucose mild elevation - ok to add on hemoglobin A1C.  Sodium is slightly low, increase sodium for a few days in diet   Recheck fasting CMP in 3 weeks advised.   Will send vitamin D prescription she is very low in vitamin D , Advise to take one tablet once a week as prescribed. Recheck vitamin d lab 1- 2 weeks after completing.

## 2020-03-25 NOTE — Telephone Encounter (Signed)
Written by Doreen Beam, FNP on 03/24/2020 1:22 PM EST View Full Comments Seen by patient Susan [name] on 03/24/2020 4:47 PM

## 2020-04-06 ENCOUNTER — Other Ambulatory Visit: Payer: Self-pay

## 2020-04-06 ENCOUNTER — Encounter: Payer: Self-pay | Admitting: Psychiatry

## 2020-04-06 ENCOUNTER — Telehealth (INDEPENDENT_AMBULATORY_CARE_PROVIDER_SITE_OTHER): Payer: Medicare Other | Admitting: Psychiatry

## 2020-04-06 DIAGNOSIS — Z634 Disappearance and death of family member: Secondary | ICD-10-CM | POA: Diagnosis not present

## 2020-04-06 DIAGNOSIS — F1021 Alcohol dependence, in remission: Secondary | ICD-10-CM

## 2020-04-06 DIAGNOSIS — F3178 Bipolar disorder, in full remission, most recent episode mixed: Secondary | ICD-10-CM | POA: Diagnosis not present

## 2020-04-06 DIAGNOSIS — F1421 Cocaine dependence, in remission: Secondary | ICD-10-CM

## 2020-04-06 DIAGNOSIS — F4310 Post-traumatic stress disorder, unspecified: Secondary | ICD-10-CM

## 2020-04-06 DIAGNOSIS — F17200 Nicotine dependence, unspecified, uncomplicated: Secondary | ICD-10-CM

## 2020-04-06 MED ORDER — LATUDA 60 MG PO TABS: 60.0000 mg | ORAL_TABLET | Freq: Every day | ORAL | 1 refills | Status: DC

## 2020-04-06 NOTE — Progress Notes (Signed)
Virtual Visit via Video Note  I connected with [place] on 04/06/20 at  3:40 PM EST by a video enabled telemedicine application and verified that I am speaking with the correct person using two identifiers.  Location Provider Location : [facility] Patient Location : Home  Participants: Patient , Provider    I discussed the limitations of evaluation and management by telemedicine and the availability of in person appointments. The patient expressed understanding and agreed to proceed.   I discussed the assessment and treatment plan with the patient. The patient was provided an opportunity to ask questions and all were answered. The patient agreed with the plan and demonstrated an understanding of the instructions.   The patient was advised to call back or seek an in-person evaluation if the symptoms worsen or if the condition fails to improve as anticipated.   [place] MD OP Progress Note  04/06/2020 3:53 PM Susan [name]  MRN:  [id number]  Chief Complaint:  Chief Complaint    Follow-up     HPI: Susan [name] is a 59 year old Caucasian female, widowed, disabled, lives in [place], has a history of bipolar disorder, alcohol and cocaine use disorder in remission, obstructive sleep apnea on CPAP, hypertension, degenerative disc disease, status post total hip replacement, osteoarthritis, hepatitis C was evaluated by telemedicine today.  Patient reports since being on the higher dosage of Latuda her mood symptoms have improved a lot.  She denies any side effects.  She is compliant on the medication.  Patient denies any suicidality, homicidality or perceptual disturbances.  Patient reports she is sleeping better.  She reports she spent her Thanksgiving day with family and friends.  She went for to [name] and that helped.  She reports she continues to have relationship struggles with her son who struggles with substance abuse problems.  She however is trying to stay away and is  currently not trying to change anything and that helps.  Patient denies any other concerns today.  Visit Diagnosis:    ICD-10-CM   1. Bipolar disorder, in full remission, most recent episode mixed (HCC)  F31.78 Lurasidone HCl (LATUDA) 60 MG TABS  2. PTSD (post-traumatic stress disorder)  F43.10   3. Bereavement  Z63.4   4. Tobacco use disorder  F17.200   5. Alcohol use disorder, severe, in sustained remission ([place])  F10.21   6. Cocaine use disorder, moderate, in sustained remission ([place])  F14.21     Past Psychiatric History: I have reviewed past psychiatric history from my progress note on 09/27/2019.  Past trials of Seroquel, Cymbalta, Abilify, risperidone  Past Medical History:  Past Medical History:  Diagnosis Date  . ADD (attention deficit disorder)   . Alcohol abuse   . Anemia   . Anxiety   . Aortic atherosclerosis ([place]) 02/20/2018   Chest CT Sept 2019  . Arthritis    rheumatoid arthritis  . Asthma   . Bipolar disorder ([place])   . Centrilobular emphysema ([place]) 02/20/2018   Chest CT Sept 2019  . Constipation   . Degenerative disc disease at L5-S1 level 09/28/2016   See ortho note May 2018  . Depression    bipolar, hx of suicide attempt  . Drug use   . Dyspnea    with exertion  . GERD (gastroesophageal reflux disease)   . H/O suicide attempt    slit wrists  . Hepatitis C 06/26/2014  . Hip pain   . History of alcohol abuse   . History of diverticulitis  2013  . History of hepatitis C    HEP "C"--three years ago  . History of MRSA infection 2013  . HLD (hyperlipidemia)   . Hypertension   . Hypothyroidism   . Incisional hernia 11/08/2012  . Knee pain   . 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 Multinodular thyroid   . OSA (obstructive sleep apnea)    managed by Dr. Manuella Ghazi  . Osteoporosis   . Post-traumatic osteoarthritis of right knee 09/24/2015  . Prediabetes   . Recurrent ventral hernia 11/08/2012  . Sleep apnea     Dx 3 years ago. Use C-PAP  . Status post total right knee  replacement using cement 05/10/2016  . Thyroid disease    Goiter  . Vitamin B12 deficiency   . Vitamin D deficiency disease     Past Surgical History:  Procedure Laterality Date  . BILATERAL SALPINGOOPHORECTOMY     due to abnormal mass  . BREAST BIOPSY Left    neg  . BREAST SURGERY Left 20 yrs ago  . CESAREAN SECTION    . COLONOSCOPY    . COLONOSCOPY WITH PROPOFOL N/A 10/16/2017   Procedure: COLONOSCOPY WITH PROPOFOL;  Surgeon: Jonathon Bellows, MD;  Location[facility];  Service: Gastroenterology;  Laterality: N/A;  . HERNIA REPAIR  06/2011, July 2014   Ventral wall repair with Physiomesh  . HERNIA REPAIR     2nd.vental wall repair  . JOINT REPLACEMENT Right    knee  . TONSILLECTOMY    . TOTAL HIP ARTHROPLASTY Left 07/23/2019   Procedure: TOTAL HIP ARTHROPLASTY;  Surgeon: Corky Mull, MD;  Location: [facility] ORS;  Service: Orthopedics;  Laterality: Left;  . TOTAL KNEE ARTHROPLASTY Right 05/10/2016   Procedure: TOTAL KNEE ARTHROPLASTY;  Surgeon: Corky Mull, MD;  Location: [facility] ORS;  Service: Orthopedics;  Laterality: Right;  . TUBAL LIGATION      Family Psychiatric History: I have reviewed family psychiatric history from my progress note from 09/27/2019  Family History:  Family History  Problem Relation Age of Onset  . Alcohol abuse Father   . Heart attack Father   . Arthritis Mother   . Asthma Mother   . Mental illness Mother   . Thyroid disease Mother   . COPD Mother   . Heart disease Mother   . Congestive Heart Failure Mother   . Alcohol abuse Mother   . Eating disorder Mother   . Bipolar disorder Mother   . Arthritis Brother   . Mental illness Brother   . Cancer Brother        non-hodkins lymphoma  . Alcohol abuse Sister   . Drug abuse Sister   . Mental illness Sister   . Mental illness Sister   . Fibromyalgia Sister   . Obesity Sister   . Pneumonia Sister   . Depression Sister   . Mental illness Sister   . Alcohol abuse Sister   . Drug abuse Sister   .  Drug abuse Son   . Alcohol abuse Son   . Drug abuse Son   . Alcohol abuse Son   . Drug abuse Daughter   . Diabetes Neg Hx   . Stroke Neg Hx   . Breast cancer Neg Hx     Social History: I have reviewed social history from my progress note on 09/27/2019 Social History   Socioeconomic History  . Marital status: Widowed    Spouse name: Not on file  . Number of children: 3  . Years of  education: Not on file  . Highest education level: Some college, no degree  Occupational History  . Occupation: disabled  Tobacco Use  . Smoking status: Current Every Day Smoker    Packs/day: 1.00    Years: 41.00    Pack years: 41.00    Types: Cigarettes  . Smokeless tobacco: Never Used  Vaping Use  . Vaping Use: Former  . Quit date: 07/15/2017  Substance and Sexual Activity  . Alcohol use: No    Alcohol/week: 0.0 standard drinks    Comment: sober since October 2018  . 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 Sexual activity: Not Currently  Other Topics Concern  . Not on file  Social History Narrative  . Not on file   Social Determinants of Health   Financial Resource Strain:   . Difficulty of Paying Living Expenses: Not on file  Food Insecurity:   . Worried About Charity fundraiser in the Last Year: Not on file  . Ran Out of Food in the Last Year: Not on file  Transportation Needs:   . Lack of Transportation (Medical): Not on file  . Lack of Transportation (Non-Medical): Not on file  Physical Activity:   . Days of Exercise per Week: Not on file  . Minutes of Exercise per Session: Not on file  Stress:   . Feeling of Stress : Not on file  Social Connections:   . Frequency of Communication with Friends and Family: Not on file  . Frequency of Social Gatherings with Friends and Family: Not on file  . Attends Religious Services: Not on file  . Active Member of Clubs or Organizations: Not on file  . Attends Archivist Meetings: Not on file  .  Marital Status: Not on file    Allergies:  Allergies  Allergen Reactions  . Wellbutrin [Bupropion]     Patient reports made " deathly sick " years ago at appointment 03/18/20.  . Lasix [Furosemide] Other (See Comments)    Electrolyte imbalance    Metabolic Disorder Labs: Lab Results  Component Value Date   HGBA1C 6.2 (H) 03/20/2020   MPG 108 04/08/2019   MPG 103 08/16/2017   No results found for: PROLACTIN Lab Results  Component Value Date   CHOL 143 08/21/2019   TRIG 83 08/21/2019   HDL 47 (L) 08/21/2019   CHOLHDL 3.0 08/21/2019   VLDL 27 10/14/2016   LDLCALC 79 08/21/2019   LDLCALC 79 04/08/2019   Lab Results  Component Value Date   TSH 1.09 10/16/2019   TSH 1.430 07/26/2018    Therapeutic Level Labs: No results found for: LITHIUM No results found for: VALPROATE No components found for:  CBMZ  Current Medications: Current Outpatient Medications  Medication Sig Dispense Refill  . HYDROcodone-acetaminophen (NORCO/VICODIN) 5-325 MG tablet 1 po qday prn.    . albuterol (VENTOLIN HFA) 108 (90 Base) MCG/ACT inhaler Inhale 2 puffs into the lungs every 4 (four) hours as needed for wheezing or shortness of breath. 18 g 2  . atenolol (TENORMIN) 25 MG tablet Take 1 tablet (25 mg total) by mouth daily. 90 tablet 3  . atorvastatin (LIPITOR) 10 MG tablet Take 1 tablet (10 mg total) by mouth at bedtime. 90 tablet 3  . clindamycin (CLEOCIN T) 1 % external solution Apply to aa's scalp QD PRN 60 mL 3  . clotrimazole (LOTRIMIN) 1 % cream Apply 1 application topically 2 (two) times daily. (Patient  not taking: Reported on 01/27/2020) 30 g 0  . cyclobenzaprine (FLEXERIL) 5 MG tablet Take 5 mg by mouth 2 (two) times daily as needed for muscle spasms.     . DULoxetine (CYMBALTA) 60 MG capsule Take 1 capsule (60 mg total) by mouth 2 (two) times daily. 180 capsule 3  . Fluticasone-Umeclidin-Vilant (TRELEGY ELLIPTA) 100-62.5-25 MCG/INH AEPB Inhale 1 puff into the lungs daily. 28 each 11  .  gabapentin (NEURONTIN) 300 MG capsule Take 600 mg by mouth 2 (two) times daily.     . hydrochlorothiazide (HYDRODIURIL) 25 MG tablet Take 1 tablet (25 mg total) by mouth daily. 90 tablet 0  . hydrOXYzine (ATARAX/VISTARIL) 25 MG tablet Take 1 tablet (25 mg total) by mouth 3 (three) times daily as needed for anxiety. For breakthrough anxiety attacks (Patient not taking: Reported on 01/27/2020) 30 tablet 1  . ketoconazole (NIZORAL) 2 % shampoo Shampoo into the scalp let sit 10 minutes then wash out. Use 3d/wk. 120 mL 3  . loratadine (CLARITIN) 10 MG tablet Take 1 tablet (10 mg total) by mouth daily as needed for allergies. (Patient taking differently: Take 10 mg by mouth daily. ) 90 tablet 3  . losartan (COZAAR) 100 MG tablet Take 1 tablet (100 mg total) by mouth daily. 90 tablet 3  . Lurasidone HCl (LATUDA) 60 MG TABS Take 1 tablet (60 mg total) by mouth daily with supper. 30 tablet 1  . meloxicam (MOBIC) 15 MG tablet Take by mouth.    Marland Kitchen omeprazole (PRILOSEC) 40 MG capsule Take 1 capsule (40 mg total) by mouth daily. 90 capsule 3  . umeclidinium-vilanterol (ANORO ELLIPTA) 62.5-25 MCG/INH AEPB Inhale 1 puff into the lungs daily. (Patient not taking: Reported on 01/27/2020) 60 each 5  . vitamin B-12 (CYANOCOBALAMIN) 1000 MCG tablet Take 1,000 mcg by mouth daily.     . Vitamin D, Ergocalciferol, (DRISDOL) 1.25 MG (50000 UNIT) CAPS capsule Take 1 capsule (50,000 Units total) by mouth every 7 (seven) days. (taking one tablet per week) recheck lab 1- 2 weeks after completion. 12 capsule 0   No current facility-administered medications for this visit.     Musculoskeletal: Strength & Muscle Tone: UTA Gait & Station: UTA Patient leans: N/A  Psychiatric Specialty Exam: Review of Systems  Psychiatric/Behavioral: The patient is nervous/anxious.   All other systems reviewed and are negative.   There were no vitals taken for this visit.There is no height or weight on file to calculate BMI.  General  Appearance: Casual  Eye Contact:  Fair  Speech:  Clear and Coherent  Volume:  Normal  Mood:  Anxious improving  Affect:  Congruent  Thought Process:  Goal Directed and Descriptions of Associations: Intact  Orientation:  Full (Time, Place, and Person)  Thought Content: Logical   Suicidal Thoughts:  No  Homicidal Thoughts:  No  Memory:  Immediate;   Fair Recent;   Fair Remote;   Fair  Judgement:  Fair  Insight:  Fair  Psychomotor Activity:  Normal  Concentration:  Concentration: Fair and Attention Span: Fair  Recall:  [employer] of Knowledge: Fair  Language: Fair  Akathisia:  No  Handed:  Right  AIMS (if indicated): UTA  Assets:  Communication Skills Desire for Improvement Housing Social Support  ADL's:  Intact  Cognition: WNL  Sleep:  Fair   Screenings: GAD-7     Office Visit from 11/06/2019 in [facility] Office Visit from 09/06/2018 in [facility]  Total GAD-7 Score  21 14    PHQ2-9     Office Visit from 11/06/2019 in [facility] Office Visit from 10/16/2019 in [facility] Office Visit from 08/21/2019 in [facility] Office Visit from 04/08/2019 in [facility] Office Visit from 01/04/2019 in [facility]  PHQ-2 Total Score 6 6 6 1 3   PHQ-9 Total Score 13 23 13 1 9        Assessment and Plan: Susan [name] is a 59 year old Caucasian female who has a history of bipolar disorder, degenerative disc disease, multiple other medical problems was evaluated by telemedicine today.  Patient is biologically predisposed given her family history of mental health problems, her own history of substance abuse problems and trauma.  Patient continues to struggle with psychosocial stressors of her son who struggles with substance abuse problems.  Patient however is currently making progress with regards to her mood.  Plan as noted below.  Plan Bipolar disorder  in remission Latuda 60 mg p.o. daily with supper Cymbalta as prescribed  PTSD-improving Cymbalta 60 mg p.o. twice daily Hydroxyzine 25 mg p.o. 3 times daily as needed for breakthrough anxiety Continue CBT with therapist Mr. Mostafa  Bereavement-improving Continue grief counseling  Tobacco use disorder-improving Provided counseling  Alcohol and cocaine use disorder in remission She is currently sober.  She continues to be active in [name].  Follow-up in clinic in 6 weeks or sooner if needed.  I have spent atleast 20 minutes face to face by video with patient today. More than 50 % of the time was spent for preparing to see the patient ( e.g., review of test, records ),  ordering medications and test ,psychoeducation and supportive psychotherapy and care coordination,as well as documenting clinical information in electronic health record. This note was generated in part or whole with voice recognition software. Voice recognition is usually quite accurate but there are transcription errors that can and very often do occur. I apologize for any typographical errors that were not detected and corrected.      Ursula Alert, MD 04/07/2020, 8:25 AM

## 2020-04-07 ENCOUNTER — Ambulatory Visit (INDEPENDENT_AMBULATORY_CARE_PROVIDER_SITE_OTHER): Payer: Medicare Other | Admitting: Psychology

## 2020-04-07 DIAGNOSIS — F3289 Other specified depressive episodes: Secondary | ICD-10-CM

## 2020-04-13 ENCOUNTER — Other Ambulatory Visit: Payer: Self-pay | Admitting: Physical Medicine and Rehabilitation

## 2020-04-13 DIAGNOSIS — M5136 Other intervertebral disc degeneration, lumbar region: Secondary | ICD-10-CM

## 2020-04-13 DIAGNOSIS — G8929 Other chronic pain: Secondary | ICD-10-CM

## 2020-04-13 DIAGNOSIS — M5441 Lumbago with sciatica, right side: Secondary | ICD-10-CM

## 2020-04-13 DIAGNOSIS — M5126 Other intervertebral disc displacement, lumbar region: Secondary | ICD-10-CM

## 2020-04-13 DIAGNOSIS — M5416 Radiculopathy, lumbar region: Secondary | ICD-10-CM

## 2020-04-13 DIAGNOSIS — M48062 Spinal stenosis, lumbar region with neurogenic claudication: Secondary | ICD-10-CM

## 2020-04-20 ENCOUNTER — Ambulatory Visit (INDEPENDENT_AMBULATORY_CARE_PROVIDER_SITE_OTHER): Payer: Medicare Other | Admitting: Psychology

## 2020-04-20 ENCOUNTER — Telehealth: Payer: Self-pay | Admitting: *Deleted

## 2020-04-20 DIAGNOSIS — F3289 Other specified depressive episodes: Secondary | ICD-10-CM | POA: Diagnosis not present

## 2020-04-20 NOTE — Telephone Encounter (Signed)
Attempted to contact and schedule lung screening scan. Message left for patient to call back to schedule. 

## 2020-04-21 ENCOUNTER — Other Ambulatory Visit: Payer: Self-pay

## 2020-04-21 ENCOUNTER — Ambulatory Visit
Admission: RE | Admit: 2020-04-21 | Discharge: 2020-04-21 | Disposition: A | Discharge status: Home / Self Care | Payer: Medicare Other | Source: Ambulatory Visit | Attending: Physical Medicine and Rehabilitation | Admitting: Physical Medicine and Rehabilitation

## 2020-04-21 DIAGNOSIS — M5136 Other intervertebral disc degeneration, lumbar region: Secondary | ICD-10-CM | POA: Insufficient documentation

## 2020-04-21 DIAGNOSIS — G8929 Other chronic pain: Secondary | ICD-10-CM

## 2020-04-21 DIAGNOSIS — M5126 Other intervertebral disc displacement, lumbar region: Secondary | ICD-10-CM

## 2020-04-21 DIAGNOSIS — M5441 Lumbago with sciatica, right side: Secondary | ICD-10-CM | POA: Diagnosis present

## 2020-04-21 DIAGNOSIS — M5442 Lumbago with sciatica, left side: Secondary | ICD-10-CM | POA: Diagnosis present

## 2020-04-21 DIAGNOSIS — M5416 Radiculopathy, lumbar region: Secondary | ICD-10-CM | POA: Diagnosis present

## 2020-04-21 DIAGNOSIS — M48062 Spinal stenosis, lumbar region with neurogenic claudication: Secondary | ICD-10-CM | POA: Diagnosis present

## 2020-05-11 ENCOUNTER — Telehealth (INDEPENDENT_AMBULATORY_CARE_PROVIDER_SITE_OTHER): Payer: Medicare Other | Admitting: Psychiatry

## 2020-05-11 ENCOUNTER — Encounter: Payer: Self-pay | Admitting: Psychiatry

## 2020-05-11 ENCOUNTER — Other Ambulatory Visit: Payer: Self-pay

## 2020-05-11 ENCOUNTER — Other Ambulatory Visit: Payer: Self-pay | Admitting: *Deleted

## 2020-05-11 DIAGNOSIS — F1021 Alcohol dependence, in remission: Secondary | ICD-10-CM

## 2020-05-11 DIAGNOSIS — Z122 Encounter for screening for malignant neoplasm of respiratory organs: Secondary | ICD-10-CM

## 2020-05-11 DIAGNOSIS — F17200 Nicotine dependence, unspecified, uncomplicated: Secondary | ICD-10-CM | POA: Diagnosis not present

## 2020-05-11 DIAGNOSIS — F3178 Bipolar disorder, in full remission, most recent episode mixed: Secondary | ICD-10-CM

## 2020-05-11 DIAGNOSIS — Z634 Disappearance and death of family member: Secondary | ICD-10-CM | POA: Diagnosis not present

## 2020-05-11 DIAGNOSIS — Z87891 Personal history of nicotine dependence: Secondary | ICD-10-CM

## 2020-05-11 DIAGNOSIS — F4310 Post-traumatic stress disorder, unspecified: Secondary | ICD-10-CM

## 2020-05-11 DIAGNOSIS — F1421 Cocaine dependence, in remission: Secondary | ICD-10-CM

## 2020-05-11 MED ORDER — LATUDA 60 MG PO TABS: 60.0000 mg | ORAL_TABLET | Freq: Every day | ORAL | 1 refills | Status: DC

## 2020-05-11 NOTE — Progress Notes (Signed)
Contacted and scheduled for annual lung screening scan. Patient is a curretn smoker with a 44 pack year history.

## 2020-05-11 NOTE — Progress Notes (Signed)
Virtual Visit via Telephone Note  I connected with Susan [name] on 05/11/20 at  2:20 PM EST by telephone and verified that I am speaking with the correct person using two identifiers.  Location Provider Location : [facility] Patient Location : Home  Participants: Patient , Provider   I discussed the limitations, risks, security and privacy concerns of performing an evaluation and management service by telephone and the availability of in person appointments. I also discussed with the patient that there may be a patient responsible charge related to this service. The patient expressed understanding and agreed to proceed.    I discussed the assessment and treatment plan with the patient. The patient was provided an opportunity to ask questions and all were answered. The patient agreed with the plan and demonstrated an understanding of the instructions.   The patient was advised to call back or seek an in-person evaluation if the symptoms worsen or if the condition fails to improve as anticipated.  [place] MD OP Progress Note  05/11/2020 2:39 PM Susan [name]  MRN:  [id number]  Chief Complaint:  Chief Complaint    Follow-up     HPI: Susan [name] is a 60 year old Caucasian female, widowed, disabled, lives in [place], has a history of bipolar disorder, alcohol and cocaine use disorder in remission, PTSD, bereavement, degenerative disc disease, obstructive sleep apnea on CPAP, status post total hip replacement, osteoarthritis, hepatitis C was evaluated by telemedicine today.  Patient today reports she is currently doing well.  She denies any significant mood swings.  She reports sleep continues to be good.  She is compliant on medications.  Denies side effects.  Patient does report struggling with back pain however currently is on prednisone which has helped a lot.  Since being on the prednisone the pain has reduced and her sleep has improved.  Patient denies any suicidality,  homicidality or perceptual disturbances.  Patient denies any other concerns today.  Visit Diagnosis:    ICD-10-CM   1. Bipolar disorder, in full remission, most recent episode mixed (HCC)  F31.78 Lurasidone HCl (LATUDA) 60 MG TABS  2. PTSD (post-traumatic stress disorder)  F43.10   3. Bereavement  Z63.4   4. Tobacco use disorder  F17.200   5. Alcohol use disorder, severe, in sustained remission ([place])  F10.21   6. Cocaine use disorder, moderate, in sustained remission ([place])  F14.21     Past Psychiatric History: I have reviewed past psychiatric history from my progress note from 09/27/2019.  Past trials of Seroquel, Cymbalta, Abilify, risperidone  Past Medical History:  Past Medical History:  Diagnosis Date  . ADD (attention deficit disorder)   . Alcohol abuse   . Anemia   . Anxiety   . Aortic atherosclerosis ([place]) 02/20/2018   Chest CT Sept 2019  . Arthritis    rheumatoid arthritis  . Asthma   . Bipolar disorder ([place])   . Centrilobular emphysema ([place]) 02/20/2018   Chest CT Sept 2019  . Constipation   . Degenerative disc disease at L5-S1 level 09/28/2016   See ortho note May 2018  . Depression    bipolar, hx of suicide attempt  . Drug use   . Dyspnea    with exertion  . GERD (gastroesophageal reflux disease)   . H/O suicide attempt    slit wrists  . Hepatitis C 06/26/2014  . Hip pain   . History of alcohol abuse   . History of diverticulitis 2013  . History of hepatitis C  HEP "C"--three years ago  . History of MRSA infection 2013  . HLD (hyperlipidemia)   . Hypertension   . Hypothyroidism   . Incisional hernia 11/08/2012  . Knee pain   . 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 Multinodular thyroid   . OSA (obstructive sleep apnea)    managed by Dr. Manuella Ghazi  . Osteoporosis   . Post-traumatic osteoarthritis of right knee 09/24/2015  . Prediabetes   . Recurrent ventral hernia 11/08/2012  . Sleep apnea     Dx 3 years ago. Use C-PAP  . Status post total right knee replacement using  cement 05/10/2016  . Thyroid disease    Goiter  . Vitamin B12 deficiency   . Vitamin D deficiency disease     Past Surgical History:  Procedure Laterality Date  . BILATERAL SALPINGOOPHORECTOMY     due to abnormal mass  . BREAST BIOPSY Left    neg  . BREAST SURGERY Left 20 yrs ago  . CESAREAN SECTION    . COLONOSCOPY    . COLONOSCOPY WITH PROPOFOL N/A 10/16/2017   Procedure: COLONOSCOPY WITH PROPOFOL;  Surgeon: Jonathon Bellows, MD;  Location: [facility] ENDOSCOPY;  Service: Gastroenterology;  Laterality: N/A;  . HERNIA REPAIR  06/2011, July 2014   Ventral wall repair with Physiomesh  . HERNIA REPAIR     2nd.vental wall repair  . JOINT REPLACEMENT Right    knee  . TONSILLECTOMY    . TOTAL HIP ARTHROPLASTY Left 07/23/2019   Procedure: TOTAL HIP ARTHROPLASTY;  Surgeon: Corky Mull, MD;  Location: [facility] ORS;  Service: Orthopedics;  Laterality: Left;  . TOTAL KNEE ARTHROPLASTY Right 05/10/2016   Procedure: TOTAL KNEE ARTHROPLASTY;  Surgeon: Corky Mull, MD;  Location: [facility] ORS;  Service: Orthopedics;  Laterality: Right;  . TUBAL LIGATION      Family Psychiatric History: I have reviewed family psychiatric history from my progress note on 09/27/2019  Family History:  Family History  Problem Relation Age of Onset  . Alcohol abuse Father   . Heart attack Father   . Arthritis Mother   . Asthma Mother   . Mental illness Mother   . Thyroid disease Mother   . COPD Mother   . Heart disease Mother   . Congestive Heart Failure Mother   . Alcohol abuse Mother   . Eating disorder Mother   . Bipolar disorder Mother   . Arthritis Brother   . Mental illness Brother   . Cancer Brother        non-hodkins lymphoma  . Alcohol abuse Sister   . Drug abuse Sister   . Mental illness Sister   . Mental illness Sister   . Fibromyalgia Sister   . Obesity Sister   . Pneumonia Sister   . Depression Sister   . Mental illness Sister   . Alcohol abuse Sister   . Drug abuse Sister   . Drug abuse Son   .  Alcohol abuse Son   . Drug abuse Son   . Alcohol abuse Son   . Drug abuse Daughter   . Diabetes Neg Hx   . Stroke Neg Hx   . Breast cancer Neg Hx     Social History: I have reviewed social history from my progress note on 09/27/2019 Social History   Socioeconomic History  . Marital status: Widowed    Spouse name: Not on file  . Number of children: 3  . Years of education: Not on file  . Highest education level: Some  college, no degree  Occupational History  . Occupation: disabled  Tobacco Use  . Smoking status: Current Every Day Smoker    Packs/day: 1.00    Years: 41.00    Pack years: 41.00    Types: Cigarettes  . Smokeless tobacco: Never Used  Vaping Use  . Vaping Use: Former  . Quit date: 07/15/2017  Substance and Sexual Activity  . Alcohol use: No    Alcohol/week: 0.0 standard drinks    Comment: sober since October 2018  . 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 Sexual activity: Not Currently  Other Topics Concern  . Not on file  Social History Narrative  . Not on file   Social Determinants of Health   Financial Resource Strain: Not on file  Food Insecurity: Not on file  Transportation Needs: Not on file  Physical Activity: Not on file  Stress: Not on file  Social Connections: Not on file    Allergies:  Allergies  Allergen Reactions  . Wellbutrin [Bupropion]     Patient reports made " deathly sick " years ago at appointment 03/18/20.  . Lasix [Furosemide] Other (See Comments)    Electrolyte imbalance    Metabolic Disorder Labs: Lab Results  Component Value Date   HGBA1C 6.2 (H) 03/20/2020   MPG 108 04/08/2019   MPG 103 08/16/2017   No results found for: PROLACTIN Lab Results  Component Value Date   CHOL 143 08/21/2019   TRIG 83 08/21/2019   HDL 47 (L) 08/21/2019   CHOLHDL 3.0 08/21/2019   VLDL 27 10/14/2016   LDLCALC 79 08/21/2019   LDLCALC 79 04/08/2019   Lab Results  Component Value Date   TSH 1.09  10/16/2019   TSH 1.430 07/26/2018    Therapeutic Level Labs: No results found for: LITHIUM No results found for: VALPROATE No components found for:  CBMZ  Current Medications: Current Outpatient Medications  Medication Sig Dispense Refill  . predniSONE (DELTASONE) 10 MG tablet Take as directed - 12 day taper    . albuterol (VENTOLIN HFA) 108 (90 Base) MCG/ACT inhaler Inhale 2 puffs into the lungs every 4 (four) hours as needed for wheezing or shortness of breath. 18 g 2  . atenolol (TENORMIN) 25 MG tablet Take 1 tablet (25 mg total) by mouth daily. 90 tablet 3  . atorvastatin (LIPITOR) 10 MG tablet Take 1 tablet (10 mg total) by mouth at bedtime. 90 tablet 3  . clindamycin (CLEOCIN T) 1 % external solution Apply to aa's scalp QD PRN 60 mL 3  . clotrimazole (LOTRIMIN) 1 % cream Apply 1 application topically 2 (two) times daily. (Patient not taking: Reported on 01/27/2020) 30 g 0  . cyclobenzaprine (FLEXERIL) 5 MG tablet Take 5 mg by mouth 2 (two) times daily as needed for muscle spasms.     . DULoxetine (CYMBALTA) 60 MG capsule Take 1 capsule (60 mg total) by mouth 2 (two) times daily. 180 capsule 3  . Fluticasone-Umeclidin-Vilant (TRELEGY ELLIPTA) 100-62.5-25 MCG/INH AEPB Inhale 1 puff into the lungs daily. 28 each 11  . gabapentin (NEURONTIN) 300 MG capsule Take 600 mg by mouth 2 (two) times daily.     . hydrochlorothiazide (HYDRODIURIL) 25 MG tablet Take 1 tablet (25 mg total) by mouth daily. 90 tablet 0  . HYDROcodone-acetaminophen (NORCO/VICODIN) 5-325 MG tablet 1 po qday prn.    . hydrOXYzine (ATARAX/VISTARIL) 25 MG tablet Take 1 tablet (25 mg total) by mouth 3 (three) times  daily as needed for anxiety. For breakthrough anxiety attacks (Patient not taking: Reported on 01/27/2020) 30 tablet 1  . ketoconazole (NIZORAL) 2 % shampoo Shampoo into the scalp let sit 10 minutes then wash out. Use 3d/wk. 120 mL 3  . loratadine (CLARITIN) 10 MG tablet Take 1 tablet (10 mg total) by mouth daily as  needed for allergies. (Patient taking differently: Take 10 mg by mouth daily. ) 90 tablet 3  . losartan (COZAAR) 100 MG tablet Take 1 tablet (100 mg total) by mouth daily. 90 tablet 3  . Lurasidone HCl (LATUDA) 60 MG TABS Take 1 tablet (60 mg total) by mouth daily with supper. 30 tablet 1  . meloxicam (MOBIC) 15 MG tablet Take by mouth.    Marland Kitchen omeprazole (PRILOSEC) 40 MG capsule Take 1 capsule (40 mg total) by mouth daily. 90 capsule 3  . predniSONE (STERAPRED UNI-PAK 48 TAB) 10 MG (48) TBPK tablet Take by mouth.    . umeclidinium-vilanterol (ANORO ELLIPTA) 62.5-25 MCG/INH AEPB Inhale 1 puff into the lungs daily. (Patient not taking: Reported on 01/27/2020) 60 each 5  . vitamin B-12 (CYANOCOBALAMIN) 1000 MCG tablet Take 1,000 mcg by mouth daily.     . Vitamin D, Ergocalciferol, (DRISDOL) 1.25 MG (50000 UNIT) CAPS capsule Take 1 capsule (50,000 Units total) by mouth every 7 (seven) days. (taking one tablet per week) recheck lab 1- 2 weeks after completion. 12 capsule 0   No current facility-administered medications for this visit.     Musculoskeletal: Strength & Muscle Tone: UTA Gait & Station: UTA Patient leans: N/A  Psychiatric Specialty Exam: Review of Systems  Musculoskeletal: Positive for back pain.  All other systems reviewed and are negative.   There were no vitals taken for this visit.There is no height or weight on file to calculate BMI.  General Appearance: UTA  Eye Contact:  UTA  Speech:  Clear and Coherent  Volume:  Normal  Mood:  Euthymic  Affect:  UTA  Thought Process:  Goal Directed and Descriptions of Associations: Intact  Orientation:  Full (Time, Place, and Person)  Thought Content: Logical   Suicidal Thoughts:  No  Homicidal Thoughts:  No  Memory:  Immediate;   Fair Recent;   Fair Remote;   Fair  Judgement:  Fair  Insight:  Fair  Psychomotor Activity:  UTA  Concentration:  Concentration: Fair and Attention Span: Fair  Recall:  [employer] of Knowledge: Fair   Language: Fair  Akathisia:  No  Handed:  Right  AIMS (if indicated):UTA  Assets:  Communication Skills Desire for Improvement Housing Social Support  ADL's:  Intact  Cognition: WNL  Sleep:  Fair   Screenings: GAD-7   Flowsheet Row Office Visit from 11/06/2019 in [facility] Office Visit from 09/06/2018 in [facility]  Total GAD-7 Score 21 14    PHQ2-9   Flowsheet Row Office Visit from 11/06/2019 in [facility] Office Visit from 10/16/2019 in [facility] Office Visit from [facility]21/2019 in [facility] Visit from 04/08/2019 in [facility] Office Visit from 01/04/2019 in [facility]  PHQ-2 Total Score 6 6 6 1 3   PHQ-9 Total Score 13 23 13 1 9        Assessment and Plan: Susan [name] is a 60 year old Caucasian female who has a history of bipolar disorder, degenerative disc disease, multiple medical problems was evaluated by telemedicine today.  Patient is biologically predisposed given  her family history of mental health problems, however history of substance abuse problems and trauma.  Patient is currently stable on current medication regimen.  Plan as noted below.  Plan Bipolar disorder in remission Latuda 60 mg p.o. daily with supper Cymbalta as prescribed  PTSD-stable Cymbalta 60 mg p.o. daily Hydroxyzine 25 mg p.o. 3 times daily as needed for breakthrough anxiety. Continue CBT with therapist-Mr.Mostafa  Bereavement-improving Continue grief counseling  Tobacco use disorder-improving Provided counseling  Alcohol and cocaine use disorder in remission Patient is currently sober.  She continues to be active in [name].  Follow-up in clinic in 6 to 8 weeks or sooner if needed.  I have spent atleast 20 minutes non face to face with patient today. More than 50 % of the time was spent for preparing to see the patient ( e.g., review of  test, records ), ordering medications and test ,psychoeducation and supportive psychotherapy and care coordination,as well as documenting clinical information in electronic health record. This note was generated in part or whole with voice recognition software. Voice recognition is usually quite accurate but there are transcription errors that can and very often do occur. I apologize for any typographical errors that were not detected and corrected.      Ursula Alert, MD 05/12/2020, 12:52 PM

## 2020-05-13 ENCOUNTER — Ambulatory Visit (INDEPENDENT_AMBULATORY_CARE_PROVIDER_SITE_OTHER): Payer: Medicare Other | Admitting: Psychology

## 2020-05-13 DIAGNOSIS — F3289 Other specified depressive episodes: Secondary | ICD-10-CM

## 2020-05-20 ENCOUNTER — Other Ambulatory Visit: Payer: Self-pay

## 2020-05-20 DIAGNOSIS — L739 Follicular disorder, unspecified: Secondary | ICD-10-CM

## 2020-05-20 MED ORDER — KETOCONAZOLE 2 % EX SHAM: MEDICATED_SHAMPOO | CUTANEOUS | 2 refills | Status: DC

## 2020-05-21 ENCOUNTER — Ambulatory Visit: Admission: RE | Admit: 2020-05-21 | Payer: Medicare Other | Source: Ambulatory Visit

## 2020-05-22 ENCOUNTER — Emergency Department: Payer: Medicare Other

## 2020-05-22 ENCOUNTER — Emergency Department
Admission: EM | Admit: 2020-05-22 | Discharge: 2020-05-22 | Disposition: A | Discharge status: Home / Self Care | Payer: Medicare Other | Attending: Emergency Medicine | Admitting: Emergency Medicine

## 2020-05-22 ENCOUNTER — Other Ambulatory Visit: Payer: Self-pay

## 2020-05-22 DIAGNOSIS — R112 Nausea with vomiting, unspecified: Secondary | ICD-10-CM | POA: Insufficient documentation

## 2020-05-22 DIAGNOSIS — Z5321 Procedure and treatment not carried out due to patient leaving prior to being seen by health care provider: Secondary | ICD-10-CM | POA: Diagnosis not present

## 2020-05-22 DIAGNOSIS — R197 Diarrhea, unspecified: Secondary | ICD-10-CM | POA: Insufficient documentation

## 2020-05-22 DIAGNOSIS — M79602 Pain in left arm: Secondary | ICD-10-CM | POA: Diagnosis not present

## 2020-05-22 DIAGNOSIS — R079 Chest pain, unspecified: Secondary | ICD-10-CM | POA: Insufficient documentation

## 2020-05-22 DIAGNOSIS — R0602 Shortness of breath: Secondary | ICD-10-CM | POA: Diagnosis not present

## 2020-05-22 LAB — BASIC METABOLIC PANEL
Anion gap: 14 (ref 5–15)
BUN: 14 mg/dL (ref 6–20)
CO2: 25 mmol/L (ref 22–32)
Calcium: 9.4 mg/dL (ref 8.9–10.3)
Chloride: 94 mmol/L — ABNORMAL LOW (ref 98–111)
Creatinine, Ser: 1.09 mg/dL — ABNORMAL HIGH (ref 0.44–1.00)
GFR, Estimated: 59 mL/min — ABNORMAL LOW (ref >60)
Glucose, Bld: 107 mg/dL — ABNORMAL HIGH (ref 70–99)
Potassium: 3.7 mmol/L (ref 3.5–5.1)
Sodium: 133 mmol/L — ABNORMAL LOW (ref 135–145)

## 2020-05-22 LAB — CBC
HCT: 37.4 % (ref 36.0–46.0)
Hemoglobin: 12.9 g/dL (ref 12.0–15.0)
MCH: 32 pg (ref 26.0–34.0)
MCHC: 34.5 g/dL (ref 30.0–36.0)
MCV: 92.8 fL (ref 80.0–100.0)
Platelets: 205 10*3/uL (ref 150–400)
RBC: 4.03 MIL/uL (ref 3.87–5.11)
RDW: 12.9 % (ref 11.5–15.5)
WBC: 10.1 10*3/uL (ref 4.0–10.5)
nRBC: 0 % (ref 0.0–0.2)

## 2020-05-22 LAB — TROPONIN I (HIGH SENSITIVITY): Troponin I (High Sensitivity): 8 ng/L (ref <18)

## 2020-05-22 NOTE — ED Triage Notes (Signed)
Pt called no response

## 2020-05-22 NOTE — ED Triage Notes (Signed)
Pt called, no response

## 2020-05-22 NOTE — ED Triage Notes (Signed)
Reports left sided chest pain radiating into left arm since 0530AM. States pain worse with movement. States mild increase in SOB since chest pain began. Pt alert and oriented X4, cooperative, RR even and unlabored, color WNL. Pt in NAD.

## 2020-05-22 NOTE — ED Triage Notes (Signed)
Pt called x's 3, no response ?

## 2020-05-26 NOTE — Progress Notes (Signed)
Subjective:   Susan [name] is a 60 y.o. female who presents for Medicare Annual (Subsequent) preventive examination.  I connected with [facility] today by telephone and verified that I am speaking with the correct person using two identifiers. Location patient: home Location provider: work Persons participating in the virtual visit: patient, provider.   I discussed the limitations, risks, security and privacy concerns of performing an evaluation and management service by telephone and the availability of in person appointments. I also discussed with the patient that there may be a patient responsible charge related to this service. The patient expressed understanding and verbally consented to this telephonic visit.    Interactive audio and video telecommunications were attempted between this provider and patient, however failed, due to patient having technical difficulties OR patient did not have access to video capability.  We continued and completed visit with audio only.   Review of Systems    N/A  Cardiac Risk Factors include: dyslipidemia;hypertension;smoking/ tobacco exposure     Objective:    Today's Vitals   05/27/20 0952  PainSc: 7    There is no height or weight on file to calculate BMI.  Advanced Directives 05/27/2020 05/22/2020 07/23/2019 07/16/2019 07/19/2018 10/16/2017 02/27/2017  Does Patient Have a Medical Advance Directive? No No No No No Yes No;Yes  Type of Advance Directive - - - - - [place] -  Does patient want to make changes to medical advance directive? No - Patient declined - - - - - -  Copy of Press photographer in Chart? - - - - - No - copy requested -  Would patient like information on creating a medical advance directive? - - No - Patient declined No - Patient declined Yes (MAU/Ambulatory/Procedural Areas - Information given) - -    Current Medications (verified) Outpatient Encounter Medications as of 05/27/2020   Medication Sig  . albuterol (VENTOLIN HFA) 108 (90 Base) MCG/ACT inhaler Inhale 2 puffs into the lungs every 4 (four) hours as needed for wheezing or shortness of breath.  Marland Kitchen atenolol (TENORMIN) 25 MG tablet Take 1 tablet (25 mg total) by mouth daily.  Marland Kitchen atorvastatin (LIPITOR) 10 MG tablet Take 1 tablet (10 mg total) by mouth at bedtime.  . clindamycin (CLEOCIN T) 1 % external solution Apply to aa's scalp QD PRN  . clotrimazole (LOTRIMIN) 1 % cream Apply 1 application topically 2 (two) times daily.  . cyclobenzaprine (FLEXERIL) 5 MG tablet Take 5 mg by mouth 2 (two) times daily as needed for muscle spasms.   . DULoxetine (CYMBALTA) 60 MG capsule Take 1 capsule (60 mg total) by mouth 2 (two) times daily.  . Fluticasone-Umeclidin-Vilant (TRELEGY ELLIPTA) 100-62.5-25 MCG/INH AEPB Inhale 1 puff into the lungs daily.  Marland Kitchen gabapentin (NEURONTIN) 300 MG capsule Take 600 mg by mouth 2 (two) times daily.   . hydrochlorothiazide (HYDRODIURIL) 25 MG tablet Take 1 tablet (25 mg total) by mouth daily.  Marland Kitchen HYDROcodone-acetaminophen (NORCO/VICODIN) 5-325 MG tablet 1 po qday prn.  . ketoconazole (NIZORAL) 2 % shampoo Shampoo into the scalp let sit 10 minutes then wash out. Use 3d/wk.  Marland Kitchen loratadine (CLARITIN) 10 MG tablet Take 1 tablet (10 mg total) by mouth daily as needed for allergies. (Patient taking differently: Take 10 mg by mouth daily.)  . losartan (COZAAR) 100 MG tablet Take 1 tablet (100 mg total) by mouth daily.  . Lurasidone HCl (LATUDA) 60 MG TABS Take 1 tablet (60 mg total) by mouth daily with  supper.  . meloxicam (MOBIC) 15 MG tablet Take by mouth.  . predniSONE (DELTASONE) 10 MG tablet Take as directed - 12 day taper  . predniSONE (STERAPRED UNI-PAK 48 TAB) 10 MG (48) TBPK tablet Take by mouth.  . vitamin B-12 (CYANOCOBALAMIN) 1000 MCG tablet Take 1,000 mcg by mouth daily.   . Vitamin D, Ergocalciferol, (DRISDOL) 1.25 MG (50000 UNIT) CAPS capsule Take 1 capsule (50,000 Units total) by mouth every 7  (seven) days. (taking one tablet per week) recheck lab 1- 2 weeks after completion.  . hydrOXYzine (ATARAX/VISTARIL) 25 MG tablet Take 1 tablet (25 mg total) by mouth 3 (three) times daily as needed for anxiety. For breakthrough anxiety attacks (Patient not taking: No sig reported)  . omeprazole (PRILOSEC) 40 MG capsule Take 1 capsule (40 mg total) by mouth daily.  Marland Kitchen umeclidinium-vilanterol (ANORO ELLIPTA) 62.5-25 MCG/INH AEPB Inhale 1 puff into the lungs daily. (Patient not taking: No sig reported)   No facility-administered encounter medications on file as of 05/27/2020.    Allergies (verified) Wellbutrin [bupropion] and Lasix [furosemide]   History: Past Medical History:  Diagnosis Date  . ADD (attention deficit disorder)   . Alcohol abuse   . Anemia   . Anxiety   . Aortic atherosclerosis ([place]) 02/20/2018   Chest CT Sept 2019  . Arthritis    rheumatoid arthritis  . Asthma   . Bipolar disorder ([place])   . Centrilobular emphysema ([place]) 02/20/2018   Chest CT Sept 2019  . Constipation   . Degenerative disc disease at L5-S1 level 09/28/2016   See ortho note May 2018  . Depression    bipolar, hx of suicide attempt  . Drug use   . Dyspnea    with exertion  . GERD (gastroesophageal reflux disease)   . H/O suicide attempt    slit wrists  . Hepatitis C 06/26/2014  . Hip pain   . History of alcohol abuse   . History of diverticulitis 2013  . History of hepatitis C    HEP "C"--three years ago  . History of MRSA infection 2013  . HLD (hyperlipidemia)   . Hypertension   . Hypothyroidism   . Incisional hernia 11/08/2012  . Knee pain   . 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 Multinodular thyroid   . OSA (obstructive sleep apnea)    managed by Dr. Manuella Ghazi  . Osteoporosis   . Post-traumatic osteoarthritis of right knee 09/24/2015  . Prediabetes   . Recurrent ventral hernia 11/08/2012  . Sleep apnea     Dx 3 years ago. Use C-PAP  . 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  Thyroid disease    Goiter  . Vitamin B12 deficiency   . Vitamin D deficiency disease    Past Surgical History:  Procedure Laterality Date  . BILATERAL SALPINGOOPHORECTOMY     due to abnormal mass  . BREAST BIOPSY Left    neg  . BREAST SURGERY Left 20 yrs ago  . CESAREAN SECTION    . COLONOSCOPY    . COLONOSCOPY WITH PROPOFOL N/A 10/16/2017   Procedure: COLONOSCOPY WITH PROPOFOL;  Surgeon: Jonathon Bellows, MD;  Location: [facility] ENDOSCOPY;  Service: Gastroenterology;  Laterality: N/A;  . HERNIA REPAIR  06/2011, July 2014   Ventral wall repair with Physiomesh  . HERNIA REPAIR     2nd.vental wall repair  . JOINT REPLACEMENT Right    knee  . TONSILLECTOMY    . TOTAL HIP ARTHROPLASTY Left 07/23/2019  Procedure: TOTAL HIP ARTHROPLASTY;  Surgeon: Corky Mull, MD;  Location: [facility] ORS;  Service: Orthopedics;  Laterality: Left;  . TOTAL KNEE ARTHROPLASTY Right 05/10/2016   Procedure: TOTAL KNEE ARTHROPLASTY;  Surgeon: Corky Mull, MD;  Location: [facility] ORS;  Service: Orthopedics;  Laterality: Right;  . TUBAL LIGATION     Family History  Problem Relation Age of Onset  . Alcohol abuse Father   . Heart attack Father   . Arthritis Mother   . Asthma Mother   . Mental illness Mother   . Thyroid disease Mother   . COPD Mother   . Heart disease Mother   . Congestive Heart Failure Mother   . Alcohol abuse Mother   . Eating disorder Mother   . Bipolar disorder Mother   . Arthritis Brother   . Mental illness Brother   . Cancer Brother        non-hodkins lymphoma  . Alcohol abuse Sister   . Drug abuse Sister   . Mental illness Sister   . Mental illness Sister   . Fibromyalgia Sister   . Obesity Sister   . Pneumonia Sister   . Depression Sister   . Mental illness Sister   . Alcohol abuse Sister   . Drug abuse Sister   . Drug abuse Son   . Alcohol abuse Son   . Drug abuse Son   . Alcohol abuse Son   . Drug abuse Daughter   . Diabetes Neg Hx   . Stroke Neg Hx   . Breast cancer Neg Hx     Social History   Socioeconomic History  . Marital status: Widowed    Spouse name: Not on file  . Number of children: 3  . Years of education: Not on file  . Highest education level: Some college, no degree  Occupational History  . Occupation: disabled  Tobacco Use  . Smoking status: Current Every Day Smoker    Packs/day: 1.00    Years: 41.00    Pack years: 41.00    Types: Cigarettes  . Smokeless tobacco: Never Used  . Tobacco comment: Pt to discuss trying the patch with PCP at [place] in office apt.   Vaping Use  . Vaping Use: Former  . Quit date: 07/15/2017  Substance and Sexual Activity  . Alcohol use: No    Alcohol/week: 0.0 standard drinks    Comment: sober since October 2018  . 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 Sexual activity: Not Currently  Other Topics Concern  . Not on file  Social History Narrative  . Not on file   Social Determinants of Health   Financial Resource Strain: Low Risk   . Difficulty of Paying Living Expenses: Not hard at all  Food Insecurity: No Food Insecurity  . Worried About Charity fundraiser in the Last Year: Never true  . Ran Out of Food in the Last Year: Never true  Transportation Needs: No Transportation Needs  . Lack of Transportation (Medical): No  . Lack of Transportation (Non-Medical): No  Physical Activity: Inactive  . Days of Exercise per Week: 0 days  . Minutes of Exercise per Session: 0 min  Stress: No Stress Concern Present  . Feeling of Stress : Only a little  Social Connections: Moderately Isolated  . Frequency of Communication with Friends and Family: More than three times a week  . Frequency of Social Gatherings with Friends and Family:  More than three times a week  . Attends Religious Services: Never  . Active Member of Clubs or Organizations: Yes  . Attends Archivist Meetings: More than 4 times per year  . Marital Status: Widowed    Tobacco Counseling Ready to  quit: Yes Counseling given: Yes Comment: Pt to discuss trying the patch with PCP at [place] in office apt.    Clinical Intake:  Pre-visit preparation completed: Yes  Pain : 0-10 Pain Score: 7  Pain Type: Chronic pain Pain Location: Back Pain Orientation: Lower Pain Descriptors / Indicators: Aching Pain Frequency: Constant Pain Relieving Factors: Takes Norco as prescribed for pain.  Pain Relieving Factors: Takes Norco as prescribed for pain.  Nutritional Risks: None Diabetes: No  How often do you need to have someone help you when you read instructions, pamphlets, or other written materials from your doctor or pharmacy?: 1 - Never  Diabetic? No  Interpreter Needed?: No  Information entered by :: [facility], LPN   Activities of Daily Living In your present state of health, do you have any difficulty performing the following activities: 05/27/2020 11/06/2019  Hearing? N N  Vision? Y N  Comment Has had some blurred vision. Pt plans to f/u with Dr George Ina soon. -  Difficulty concentrating or making decisions? N Y  Walking or climbing stairs? N Y  Comment - -  Dressing or bathing? N N  Doing errands, shopping? N N  Preparing Food and eating ? N -  Using the Toilet? N -  In the past six months, have you accidently leaked urine? N -  Do you have problems with loss of bowel control? N -  Managing your Medications? N -  Managing your Finances? N -  Housekeeping or managing your Housekeeping? N -  Some recent data might be hidden    Patient Care Team: Flinchum, Kelby Aline, FNP as PCP - General (Family Medicine) Kate Sable, MD as PCP - Cardiology (Cardiology) Sharlet Salina, MD as Referring Physician (Physical Medicine and Rehabilitation) Vladimir Crofts, MD (Neurology) Gabriel Carina Betsey Holiday, MD as Consulting Physician (Endocrinology) Birder Robson, MD as Referring Physician (Ophthalmology) Lattie Corns, PA-C as Physician Assistant (Physician  Assistant) Poggi, Marshall Cork, MD as Consulting Physician (Orthopedic Surgery) Sharlotte Alamo, DPM (Podiatry) Ursula Alert, MD as Consulting Physician (Psychiatry) Buena Irish, LCSW as Social Worker (Psychology) Ralene Bathe, MD (Dermatology)  Indicate any recent Medical Services you may have received from other than [facility] providers in the past year (date may be approximate).     Assessment:   This is a routine wellness examination for [place].  Hearing/Vision screen No exam data present  Dietary issues and exercise activities discussed: Current Exercise Habits: The patient does not participate in regular exercise at present, Exercise limited by: orthopedic condition(s)  Goals    . DIET - INCREASE WATER INTAKE     Recommend drinking 6-8 glasses of water per day    . Exercise 3x per week (30 min per time)     Recommend to exercise for 3 days a week for at least 30 minutes at a time.     . Quit Smoking      Depression Screen PHQ 2/9 Scores 05/27/2020 11/06/2019 10/16/2019 08/21/2019 04/08/2019 01/04/2019 09/06/2018  PHQ - 2 Score 1 6 6 6 1 3 2   PHQ- 9 Score - 13 23 13 1 9 7     Fall Risk Fall Risk  05/27/2020 11/06/2019 10/16/2019 08/21/2019 04/08/2019  Falls in the past year?  0 0 0 0 0  Number falls in past yr: 0 0 0 0 0  Injury with Fall? 0 0 0 0 0  Risk for fall due to : - - - - -  Risk for fall due to: Comment - - - - -  Follow up - Falls evaluation completed - - -    FALL RISK PREVENTION PERTAINING TO THE HOME:  Any stairs in or around the home? Yes  If so, are there any without handrails? No  Home free of loose throw rugs in walkways, pet beds, electrical cords, etc? Yes  Adequate lighting in your home to reduce risk of falls? Yes   ASSISTIVE DEVICES UTILIZED TO PREVENT FALLS:  Life alert? No  Use of a cane, walker or w/c? No  Grab bars in the bathroom? Yes  Shower chair or bench in shower? No  Elevated toilet seat or a handicapped toilet? Yes    Cognitive  Function: Normal cognitive status assessed by observation by this Nurse Health Advisor. No abnormalities found.       6CIT Screen 01/04/2019 07/19/2018  What Year? 0 points 0 points  What month? 0 points 0 points  What time? 0 points 0 points  Count back from 20 0 points 0 points  Months in reverse 0 points 0 points  Repeat phrase 0 points 0 points  Total Score 0 0    Immunizations Immunization History  Administered Date(s) Administered  . Influenza Inj Mdck Quad Pf 02/13/2019  . Influenza,inj,Quad PF,6+ Mos 04/09/2015, 01/10/2018, 04/08/2019, 01/27/2020  . Influenza-Unspecified 05/26/2014, 03/30/2016  . PFIZER(Purple Top)SARS-COV-2 Vaccination 08/31/2019, 09/24/2019  . Pneumococcal Polysaccharide-23 04/09/2015  . Tdap 03/09/2012    TDAP status: Up to date  Flu Vaccine status: Up to date  Covid-19 vaccine status: Completed vaccines  Qualifies for Shingles Vaccine? Yes   Zostavax completed No   Shingrix Completed?: No.    Education has been provided regarding the importance of this vaccine. Patient has been advised to call insurance company to determine out of pocket expense if they have not yet received this vaccine. Advised may also receive vaccine at local pharmacy or Health Dept. Verbalized acceptance and understanding.  Screening Tests Health Maintenance  Topic Date Due  . MAMMOGRAM  03/16/2018  . PAP SMEAR-Modifier  07/08/2018  . COVID-19 Vaccine (3 - Booster for Pfizer series) 03/26/2020  . COLONOSCOPY (Pts 45-5yrs Insurance coverage will need to be confirmed)  08/20/2020 (Originally 10/17/2018)  . TETANUS/TDAP  03/09/2022  . INFLUENZA VACCINE  Completed  . Hepatitis C Screening  Completed  . HIV Screening  Completed    Health Maintenance  Health Maintenance Due  Topic Date Due  . MAMMOGRAM  03/16/2018  . PAP SMEAR-Modifier  07/08/2018  . COVID-19 Vaccine (3 - Booster for Pfizer series) 03/26/2020    Colorectal cancer screening: Currently due, declined  referral or cologuard order today.  Mammogram status: Ordered 01/27/20. Pt provided with contact info and advised to call to schedule appt.   Lung Cancer Screening: (Low Dose CT Chest recommended if Age 12-80 years, 30 pack-year currently smoking OR have quit w/in 15years.) does qualify. Order on file. Pt missed last apt. Will message Burgess Estelle to have him r/s.  Additional Screening:  Hepatitis C Screening: Up to date  Vision Screening: Recommended annual ophthalmology exams for early detection of glaucoma and other disorders of the eye. Is the patient up to date with their annual eye exam?  Yes  Who is the provider or what  is the name of the office in which the patient attends annual eye exams? Dr George Ina @ [place] If pt is not established with a provider, would they like to be referred to a provider to establish care? No .   Dental Screening: Recommended annual dental exams for proper oral hygiene  Community Resource Referral / Chronic Care Management: CRR required this visit?  No   CCM required this visit?  No      Plan:     I have personally reviewed and noted the following in the patient's chart:   . Medical and social history . Use of alcohol, tobacco or illicit drugs  . Current medications and supplements . Functional ability and status . Nutritional status . Physical activity . Advanced directives . List of other physicians . Hospitalizations, surgeries, and ER visits in previous 12 months . Vitals . Screenings to include cognitive, depression, and falls . Referrals and appointments  In addition, I have reviewed and discussed with patient certain preventive protocols, quality metrics, and best practice recommendations. A written personalized care plan for preventive services as well as general preventive health recommendations were provided to patient.     Khamiyah Grefe [place], [name]   [id number]   Nurse Notes: Pt declined colonoscopy referral or cologuard order  today. Pt to speak with PCP tomorrow about concerns. Advised pt that she is due for her mammogram and that order is already on file. Offered contact information and pt declined and stated she would speak with PCP about this information as well. Pt has not received her Covid booster but plans to look into this at [place] today. Pt is due for a pap smear.

## 2020-05-27 ENCOUNTER — Other Ambulatory Visit: Payer: Self-pay

## 2020-05-27 ENCOUNTER — Ambulatory Visit (INDEPENDENT_AMBULATORY_CARE_PROVIDER_SITE_OTHER): Payer: Medicare Other | Admitting: Psychology

## 2020-05-27 ENCOUNTER — Ambulatory Visit (INDEPENDENT_AMBULATORY_CARE_PROVIDER_SITE_OTHER): Payer: Medicare Other

## 2020-05-27 DIAGNOSIS — Z0000 Encounter for general adult medical examination without abnormal findings: Secondary | ICD-10-CM

## 2020-05-27 DIAGNOSIS — F17200 Nicotine dependence, unspecified, uncomplicated: Secondary | ICD-10-CM | POA: Diagnosis not present

## 2020-05-27 DIAGNOSIS — F3289 Other specified depressive episodes: Secondary | ICD-10-CM | POA: Diagnosis not present

## 2020-05-27 NOTE — Patient Instructions (Signed)
[name] , Thank you for taking time to come for your Medicare Wellness Visit. I appreciate your ongoing commitment to your health goals. Please review the following plan we discussed and let me know if I can assist you in the future.   Screening recommendations/referrals: Colonoscopy: Currently due, declined referral or cologuard order today. Will follow up with PCP about this at next in office apt (05/28/20). Mammogram: Order on file. Please call [phone] to schedule your mammogram.  Recommended yearly ophthalmology/optometry visit for glaucoma screening and checkup Recommended yearly dental visit for hygiene and checkup  Vaccinations: Influenza vaccine: Done 01/27/20 Tdap vaccine: Up to date, due 03/2022 Shingles vaccine: Shingrix discussed. Please contact your pharmacy for coverage information.     Advanced directives: Advance directive discussed with you today. Please request a form from the office and complete at home and have notarized. Once this is complete please bring a copy in to our office so we can scan it into your chart.  Conditions/risks identified: Smoking cessation discussed today. Recommend to continue to increase water intake and start back to the gym.   Next appointment: 05/28/20 @ 8:20 AM with Susan [name] 65 Years and Older, Female Preventive care refers to lifestyle choices and visits with your health care provider that can promote health and wellness. What does preventive care include?  A yearly physical exam. This is also called an annual well check.  Dental exams once or twice a year.  Routine eye exams. Ask your health care provider how often you should have your eyes checked.  Personal lifestyle choices, including:  Daily care of your teeth and gums.  Regular physical activity.  Eating a healthy diet.  Avoiding tobacco and drug use.  Limiting alcohol use.  Practicing safe sex.  Taking low-dose aspirin every  day.  Taking vitamin and mineral supplements as recommended by your health care provider. What happens during an annual well check? The services and screenings done by your health care provider during your annual well check will depend on your age, overall health, lifestyle risk factors, and family history of disease. Counseling  Your health care provider may ask you questions about your:  Alcohol use.  Tobacco use.  Drug use.  Emotional well-being.  Home and relationship well-being.  Sexual activity.  Eating habits.  History of falls.  Memory and ability to understand (cognition).  Work and work Statistician.  Reproductive health. Screening  You may have the following tests or measurements:  Height, weight, and BMI.  Blood pressure.  Lipid and cholesterol levels. These may be checked every 5 years, or more frequently if you are over 51 years old.  Skin check.  Lung cancer screening. You may have this screening every year starting at age 5 if you have a 30-pack-year history of smoking and currently smoke or have quit within the past 15 years.  Fecal occult blood test (FOBT) of the stool. You may have this test every year starting at age 78.  Flexible sigmoidoscopy or colonoscopy. You may have a sigmoidoscopy every 5 years or a colonoscopy every 10 years starting at age 14.  Hepatitis C blood test.  Hepatitis B blood test.  Sexually transmitted disease (STD) testing.  Diabetes screening. This is done by checking your blood sugar (glucose) after you have not eaten for a while (fasting). You may have this done every 1-3 years.  Bone density scan. This is done to screen for osteoporosis. You may have this done starting  at age 67.  Mammogram. This may be done every 1-2 years. Talk to your health care provider about how often you should have regular mammograms. Talk with your health care provider about your test results, treatment options, and if necessary, the need  for more tests. Vaccines  Your health care provider may recommend certain vaccines, such as:  Influenza vaccine. This is recommended every year.  Tetanus, diphtheria, and acellular pertussis (Tdap, Td) vaccine. You may need a Td booster every 10 years.  Zoster vaccine. You may need this after age 44.  Pneumococcal 13-valent conjugate (PCV13) vaccine. One dose is recommended after age 88.  Pneumococcal polysaccharide (PPSV23) vaccine. One dose is recommended after age 59. Talk to your health care provider about which screenings and vaccines you need and how often you need them. This information is not intended to replace advice given to you by your health care provider. Make sure you discuss any questions you have with your health care provider. Document Released: 05/22/2015 Document Revised: 01/13/2016 Document Reviewed: 02/24/2015 Elsevier Interactive Patient Education  2017 [place] Prevention in the Home Falls can cause injuries. They can happen to people of all ages. There are many things you can do to make your home safe and to help prevent falls. What can I do on the outside of my home?  Regularly fix the edges of walkways and driveways and fix any cracks.  Remove anything that might make you trip as you walk through a door, such as a raised step or threshold.  Trim any bushes or trees on the path to your home.  Use bright outdoor lighting.  Clear any walking paths of anything that might make someone trip, such as rocks or tools.  Regularly check to see if handrails are loose or broken. Make sure that both sides of any steps have handrails.  Any raised decks and porches should have guardrails on the edges.  Have any leaves, snow, or ice cleared regularly.  Use sand or salt on walking paths during winter.  Clean up any spills in your garage right away. This includes oil or grease spills. What can I do in the bathroom?  Use night lights.  Install grab bars  by the toilet and in the tub and shower. Do not use towel bars as grab bars.  Use non-skid mats or decals in the tub or shower.  If you need to sit down in the shower, use a plastic, non-slip stool.  Keep the floor dry. Clean up any water that spills on the floor as soon as it happens.  Remove soap buildup in the tub or shower regularly.  Attach bath mats securely with double-sided non-slip rug tape.  Do not have throw rugs and other things on the floor that can make you trip. What can I do in the bedroom?  Use night lights.  Make sure that you have a light by your bed that is easy to reach.  Do not use any sheets or blankets that are too big for your bed. They should not hang down onto the floor.  Have a firm chair that has side arms. You can use this for support while you get dressed.  Do not have throw rugs and other things on the floor that can make you trip. What can I do in the kitchen?  Clean up any spills right away.  Avoid walking on wet floors.  Keep items that you use a lot in easy-to-reach places.  If  you need to reach something above you, use a strong step stool that has a grab bar.  Keep electrical cords out of the way.  Do not use floor polish or wax that makes floors slippery. If you must use wax, use non-skid floor wax.  Do not have throw rugs and other things on the floor that can make you trip. What can I do with my stairs?  Do not leave any items on the stairs.  Make sure that there are handrails on both sides of the stairs and use them. Fix handrails that are broken or loose. Make sure that handrails are as long as the stairways.  Check any carpeting to make sure that it is firmly attached to the stairs. Fix any carpet that is loose or worn.  Avoid having throw rugs at the top or bottom of the stairs. If you do have throw rugs, attach them to the floor with carpet tape.  Make sure that you have a light switch at the top of the stairs and the  bottom of the stairs. If you do not have them, ask someone to add them for you. What else can I do to help prevent falls?  Wear shoes that:  Do not have high heels.  Have rubber bottoms.  Are comfortable and fit you well.  Are closed at the toe. Do not wear sandals.  If you use a stepladder:  Make sure that it is fully opened. Do not climb a closed stepladder.  Make sure that both sides of the stepladder are locked into place.  Ask someone to hold it for you, if possible.  Clearly mark and make sure that you can see:  Any grab bars or handrails.  First and last steps.  Where the edge of each step is.  Use tools that help you move around (mobility aids) if they are needed. These include:  Canes.  Walkers.  Scooters.  Crutches.  Turn on the lights when you go into a dark area. Replace any light bulbs as soon as they burn out.  Set up your furniture so you have a clear path. Avoid moving your furniture around.  If any of your floors are uneven, fix them.  If there are any pets around you, be aware of where they are.  Review your medicines with your doctor. Some medicines can make you feel dizzy. This can increase your chance of falling. Ask your doctor what other things that you can do to help prevent falls. This information is not intended to replace advice given to you by your health care provider. Make sure you discuss any questions you have with your health care provider. Document Released: 02/19/2009 Document Revised: 10/01/2015 Document Reviewed: 05/30/2014 Elsevier Interactive Patient Education  2017 [employer].

## 2020-05-28 ENCOUNTER — Other Ambulatory Visit: Payer: Self-pay

## 2020-05-28 ENCOUNTER — Ambulatory Visit (INDEPENDENT_AMBULATORY_CARE_PROVIDER_SITE_OTHER): Payer: Medicare Other | Admitting: Adult Health

## 2020-05-28 ENCOUNTER — Encounter: Payer: Self-pay | Admitting: Adult Health

## 2020-05-28 VITALS — BP 158/81 | HR 84 | Temp 98.8°F | Resp 16 | Wt 243.0 lb

## 2020-05-28 DIAGNOSIS — R450 Nervousness: Secondary | ICD-10-CM

## 2020-05-28 DIAGNOSIS — F1021 Alcohol dependence, in remission: Secondary | ICD-10-CM

## 2020-05-28 DIAGNOSIS — I10 Essential (primary) hypertension: Secondary | ICD-10-CM

## 2020-05-28 DIAGNOSIS — E559 Vitamin D deficiency, unspecified: Secondary | ICD-10-CM | POA: Diagnosis not present

## 2020-05-28 DIAGNOSIS — E119 Type 2 diabetes mellitus without complications: Secondary | ICD-10-CM

## 2020-05-28 DIAGNOSIS — F419 Anxiety disorder, unspecified: Secondary | ICD-10-CM

## 2020-05-28 DIAGNOSIS — I700 Atherosclerosis of aorta: Secondary | ICD-10-CM

## 2020-05-28 DIAGNOSIS — F3170 Bipolar disorder, currently in remission, most recent episode unspecified: Secondary | ICD-10-CM

## 2020-05-28 MED ORDER — OZEMPIC (0.25 OR 0.5 MG/DOSE) 2 MG/1.5ML ~~LOC~~ SOPN: 0.2500 mg | PEN_INJECTOR | SUBCUTANEOUS | 2 refills | Status: DC

## 2020-05-28 MED ORDER — ATENOLOL 25 MG PO TABS: 37.5000 mg | ORAL_TABLET | Freq: Every day | ORAL | 3 refills | Status: DC

## 2020-05-28 MED ORDER — BLOOD GLUCOSE METER KIT: PACK | 0 refills | Status: DC

## 2020-05-28 MED ORDER — HYDROXYZINE HCL 50 MG PO TABS
50.0000 mg | ORAL_TABLET | Freq: Three times a day (TID) | ORAL | 0 refills | Status: DC | PRN
Start: 2020-05-28 — End: 2021-02-05

## 2020-05-28 NOTE — Patient Instructions (Signed)
Semaglutide injection solution What is this medicine? SEMAGLUTIDE (Sem a GLOO tide) is used to improve blood sugar control in adults with type 2 diabetes. This medicine may be used with other diabetes medicines. This drug may also reduce the risk of heart attack or stroke if you have type 2 diabetes and risk factors for heart disease. This medicine may be used for other purposes; ask your health care provider or pharmacist if you have questions. COMMON BRAND NAME(S): OZEMPIC What should I tell my health care provider before I take this medicine? They need to know if you have any of these conditions:  endocrine tumors (MEN 2) or if someone in your family had these tumors  eye disease, vision problems  history of pancreatitis  kidney disease  stomach problems  thyroid cancer or if someone in your family had thyroid cancer  an unusual or allergic reaction to semaglutide, other medicines, foods, dyes, or preservatives  pregnant or trying to get pregnant  breast-feeding How should I use this medicine? This medicine is for injection under the skin of your upper leg (thigh), stomach area, or upper arm. It is given once every week (every 7 days). You will be taught how to prepare and give this medicine. Use exactly as directed. Take your medicine at regular intervals. Do not take it more often than directed. If you use this medicine with insulin, you should inject this medicine and the insulin separately. Do not mix them together. Do not give the injections right next to each other. Change (rotate) injection sites with each injection. It is important that you put your used needles and syringes in a special sharps container. Do not put them in a trash can. If you do not have a sharps container, call your pharmacist or healthcare provider to get one. A special MedGuide will be given to you by the pharmacist with each prescription and refill. Be sure to read this information carefully each  time. This drug comes with INSTRUCTIONS FOR USE. Ask your pharmacist for directions on how to use this drug. Read the information carefully. Talk to your pharmacist or health care provider if you have questions. Talk to your pediatrician regarding the use of this medicine in children. Special care may be needed. Overdosage: If you think you have taken too much of this medicine contact a poison control center or emergency room at once. NOTE: This medicine is only for you. Do not share this medicine with others. What if I miss a dose? If you miss a dose, take it as soon as you can within 5 days after the missed dose. Then take your next dose at your regular weekly time. If it has been longer than 5 days after the missed dose, do not take the missed dose. Take the next dose at your regular time. Do not take double or extra doses. If you have questions about a missed dose, contact your health care provider for advice. What may interact with this medicine?  other medicines for diabetes Many medications may cause changes in blood sugar, these include:  alcohol containing beverages  antiviral medicines for HIV or AIDS  aspirin and aspirin-like drugs  certain medicines for blood pressure, heart disease, irregular heart beat  chromium  diuretics  female hormones, such as estrogens or progestins, birth control pills  fenofibrate  gemfibrozil  isoniazid  lanreotide  female hormones or anabolic steroids  MAOIs like Carbex, Eldepryl, Marplan, Nardil, and Parnate  medicines for weight loss  medicines for   allergies, asthma, cold, or cough  medicines for depression, anxiety, or psychotic disturbances  niacin  nicotine  NSAIDs, medicines for pain and inflammation, like ibuprofen or naproxen  octreotide  pasireotide  pentamidine  phenytoin  probenecid  quinolone antibiotics such as ciprofloxacin, levofloxacin, ofloxacin  some herbal dietary supplements  steroid medicines  such as prednisone or cortisone  sulfamethoxazole; trimethoprim  thyroid hormones Some medications can hide the warning symptoms of low blood sugar (hypoglycemia). You may need to monitor your blood sugar more closely if you are taking one of these medications. These include:  beta-blockers, often used for high blood pressure or heart problems (examples include atenolol, metoprolol, propranolol)  clonidine  guanethidine  reserpine This list may not describe all possible interactions. Give your health care provider a list of all the medicines, herbs, non-prescription drugs, or dietary supplements you use. Also tell them if you smoke, drink alcohol, or use illegal drugs. Some items may interact with your medicine. What should I watch for while using this medicine? Visit your doctor or health care professional for regular checks on your progress. Drink plenty of fluids while taking this medicine. Check with your doctor or health care professional if you get an attack of severe diarrhea, nausea, and vomiting. The loss of too much body fluid can make it dangerous for you to take this medicine. A test called the HbA1C (A1C) will be monitored. This is a simple blood test. It measures your blood sugar control over the last 2 to 3 months. You will receive this test every 3 to 6 months. Learn how to check your blood sugar. Learn the symptoms of low and high blood sugar and how to manage them. Always carry a quick-source of sugar with you in case you have symptoms of low blood sugar. Examples include hard sugar candy or glucose tablets. Make sure others know that you can choke if you eat or drink when you develop serious symptoms of low blood sugar, such as seizures or unconsciousness. They must get medical help at once. Tell your doctor or health care professional if you have high blood sugar. You might need to change the dose of your medicine. If you are sick or exercising more than usual, you might need  to change the dose of your medicine. Do not skip meals. Ask your doctor or health care professional if you should avoid alcohol. Many nonprescription cough and cold products contain sugar or alcohol. These can affect blood sugar. Pens should never be shared. Even if the needle is changed, sharing may result in passing of viruses like hepatitis or HIV. Wear a medical ID bracelet or chain, and carry a card that describes your disease and details of your medicine and dosage times. Do not become pregnant while taking this medicine. Women should inform their doctor if they wish to become pregnant or think they might be pregnant. There is a potential for serious side effects to an unborn child. Talk to your health care professional or pharmacist for more information. What side effects may I notice from receiving this medicine? Side effects that you should report to your doctor or health care professional as soon as possible:  allergic reactions like skin rash, itching or hives, swelling of the face, lips, or tongue  breathing problems  changes in vision  diarrhea that continues or is severe  lump or swelling on the neck  severe nausea  signs and symptoms of infection like fever or chills; cough; sore throat; pain or trouble   passing urine  signs and symptoms of low blood sugar such as feeling anxious, confusion, dizziness, increased hunger, unusually weak or tired, sweating, shakiness, cold, irritable, headache, blurred vision, fast heartbeat, loss of consciousness  signs and symptoms of kidney injury like trouble passing urine or change in the amount of urine  trouble swallowing  unusual stomach upset or pain  vomiting Side effects that usually do not require medical attention (report to your doctor or health care professional if they continue or are bothersome):  constipation  diarrhea  nausea  pain, redness, or irritation at site where injected  stomach upset This list may not  describe all possible side effects. Call your doctor for medical advice about side effects. You may report side effects to FDA at 1-800-FDA-1088. Where should I keep my medicine? Keep out of the reach of children. Store unopened pens in a refrigerator between 2 and 8 degrees C (36 and 46 degrees F). Do not freeze. Protect from light and heat. After you first use the pen, it can be stored for 56 days at room temperature between 15 and 30 degrees C (59 and 86 degrees F) or in a refrigerator. Throw away your used pen after 56 days or after the expiration date, whichever comes first. Do not store your pen with the needle attached. If the needle is left on, medicine may leak from the pen. NOTE: This sheet is a summary. It may not cover all possible information. If you have questions about this medicine, talk to your doctor, pharmacist, or health care provider.  2021 Elsevier/Gold Standard (2019-01-08 09:41:51) Diabetes Mellitus and Nutrition, Adult When you have diabetes, or diabetes mellitus, it is very important to have healthy eating habits because your blood sugar (glucose) levels are greatly affected by what you eat and drink. Eating healthy foods in the right amounts, at about the same times every day, can help you:  Control your blood glucose.  Lower your risk of heart disease.  Improve your blood pressure.  Reach or maintain a healthy weight. What can affect my meal plan? Every person with diabetes is different, and each person has different needs for a meal plan. Your health care provider may recommend that you work with a dietitian to make a meal plan that is best for you. Your meal plan may vary depending on factors such as:  The calories you need.  The medicines you take.  Your weight.  Your blood glucose, blood pressure, and cholesterol levels.  Your activity level.  Other health conditions you have, such as heart or kidney disease. How do carbohydrates affect  me? Carbohydrates, also called carbs, affect your blood glucose level more than any other type of food. Eating carbs naturally raises the amount of glucose in your blood. Carb counting is a method for keeping track of how many carbs you eat. Counting carbs is important to keep your blood glucose at a healthy level, especially if you use insulin or take certain oral diabetes medicines. It is important to know how many carbs you can safely have in each meal. This is different for every person. Your dietitian can help you calculate how many carbs you should have at each meal and for each snack. How does alcohol affect me? Alcohol can cause a sudden decrease in blood glucose (hypoglycemia), especially if you use insulin or take certain oral diabetes medicines. Hypoglycemia can be a life-threatening condition. Symptoms of hypoglycemia, such as sleepiness, dizziness, and confusion, are similar to symptoms of having  too much alcohol.  Do not drink alcohol if: ? Your health care provider tells you not to drink. ? You are pregnant, may be pregnant, or are planning to become pregnant.  If you drink alcohol: ? Do not drink on an empty stomach. ? Limit how much you use to:  0-1 drink a day for women.  0-2 drinks a day for men. ? Be aware of how much alcohol is in your drink. In the [place] one drink equals one 12 oz bottle of beer (355 mL), one 5 oz glass of wine (148 mL), or one 1 oz glass of hard liquor (44 mL). ? Keep yourself hydrated with water, diet soda, or unsweetened iced tea.  Keep in mind that regular soda, juice, and other mixers may contain a lot of sugar and must be counted as carbs. What are tips for following this plan? Reading food labels  Start by checking the serving size on the "Nutrition Facts" label of packaged foods and drinks. The amount of calories, carbs, fats, and other nutrients listed on the label is based on one serving of the item. Many items contain more than one serving  per package.  Check the total grams (g) of carbs in one serving. You can calculate the number of servings of carbs in one serving by dividing the total carbs by 15. For example, if a food has 30 g of total carbs per serving, it would be equal to 2 servings of carbs.  Check the number of grams (g) of saturated fats and trans fats in one serving. Choose foods that have a low amount or none of these fats.  Check the number of milligrams (mg) of salt (sodium) in one serving. Most people should limit total sodium intake to less than 2,300 mg per day.  Always check the nutrition information of foods labeled as "low-fat" or "nonfat." These foods may be higher in added sugar or refined carbs and should be avoided.  Talk to your dietitian to identify your daily goals for nutrients listed on the label. Shopping  Avoid buying canned, pre-made, or processed foods. These foods tend to be high in fat, sodium, and added sugar.  Shop around the outside edge of the grocery store. This is where you will most often find fresh fruits and vegetables, bulk grains, fresh meats, and fresh dairy. Cooking  Use low-heat cooking methods, such as baking, instead of high-heat cooking methods like deep frying.  Cook using healthy oils, such as olive, canola, or sunflower oil.  Avoid cooking with butter, cream, or high-fat meats. Meal planning  Eat meals and snacks regularly, preferably at the same times every day. Avoid going long periods of time without eating.  Eat foods that are high in fiber, such as fresh fruits, vegetables, beans, and whole grains. Talk with your dietitian about how many servings of carbs you can eat at each meal.  Eat 4-6 oz (112-168 g) of lean protein each day, such as lean meat, chicken, fish, eggs, or tofu. One ounce (oz) of lean protein is equal to: ? 1 oz (28 g) of meat, chicken, or fish. ? 1 egg. ?  cup (62 g) of tofu.  Eat some foods each day that contain healthy fats, such as  avocado, nuts, seeds, and fish.   What foods should I eat? Fruits Berries. Apples. Oranges. Peaches. Apricots. Plums. Grapes. Mango. Papaya. Pomegranate. Kiwi. Cherries. Vegetables Lettuce. Spinach. Leafy greens, including kale, chard, collard greens, and mustard greens. Beets. Cauliflower. Cabbage.  Broccoli. Carrots. Green beans. Tomatoes. Peppers. Onions. Cucumbers. Brussels sprouts. Grains Whole grains, such as whole-wheat or whole-grain bread, crackers, tortillas, cereal, and pasta. Unsweetened oatmeal. Quinoa. Brown or wild rice. Meats and other proteins Seafood. Poultry without skin. Lean cuts of poultry and beef. Tofu. Nuts. Seeds. Dairy Low-fat or fat-free dairy products such as milk, yogurt, and cheese. The items listed above may not be a complete list of foods and beverages you can eat. Contact a dietitian for more information. What foods should I avoid? Fruits Fruits canned with syrup. Vegetables Canned vegetables. Frozen vegetables with butter or cream sauce. Grains Refined white flour and flour products such as bread, pasta, snack foods, and cereals. Avoid all processed foods. Meats and other proteins Fatty cuts of meat. Poultry with skin. Breaded or fried meats. Processed meat. Avoid saturated fats. Dairy Full-fat yogurt, cheese, or milk. Beverages Sweetened drinks, such as soda or iced tea. The items listed above may not be a complete list of foods and beverages you should avoid. Contact a dietitian for more information. Questions to ask a health care provider  Do I need to meet with a diabetes educator?  Do I need to meet with a dietitian?  What number can I call if I have questions?  When are the best times to check my blood glucose? Where to find more information:  American Diabetes Association: diabetes.org  Academy of Nutrition and Dietetics: www.eatright.[employer] of Diabetes and Digestive and Kidney Diseases:  [employer]  Association of Diabetes Care and Education Specialists: www.diabeteseducator.org Summary  It is important to have healthy eating habits because your blood sugar (glucose) levels are greatly affected by what you eat and drink.  A healthy meal plan will help you control your blood glucose and maintain a healthy lifestyle.  Your health care provider may recommend that you work with a dietitian to make a meal plan that is best for you.  Keep in mind that carbohydrates (carbs) and alcohol have immediate effects on your blood glucose levels. It is important to count carbs and to use alcohol carefully. This information is not intended to replace advice given to you by your health care provider. Make sure you discuss any questions you have with your health care provider. Document Revised: 04/02/2019 Document Reviewed: 04/02/2019 Elsevier Patient Education  2021 [employer].

## 2020-05-28 NOTE — Progress Notes (Signed)
Established patient visit   Patient: Susan [name]   DOB: December 07, 1960   60 y.o. Female  MRN: [id number] Visit Date: 05/28/2020  Today's healthcare provider: Marcille Buffy, FNP   Chief Complaint  Patient presents with  . Hypertension   Subjective    HPI  Hypertension, follow-up  BP Readings from Last 3 Encounters:  05/28/20 (!) 158/81  05/22/20 (!) 150/73  03/18/20 (!) 155/81   Wt Readings from Last 3 Encounters:  05/28/20 243 lb (110.2 kg)  05/22/20 239 lb (108.4 kg)  03/18/20 243 lb 3.2 oz (110.3 kg)     She was last seen for hypertension 3 months ago.  BP at that visit was 134/70. Management since that visit includes none.  She reports excellent compliance with treatment. She is not having side effects.  She is following a Regular diet. She is not exercising. She does not smoke.  Use of agents associated with hypertension: none.   She has pain in her back has back injection Tuesday the 25th of January with Dr. Margette Fast.   Outside blood pressures are not being checked. Symptoms: No chest pain Yes chest pressure  No palpitations No syncope  No dyspnea No orthopnea  No paroxysmal nocturnal dyspnea No lower extremity edema    Patient  denies any fever,chills, rash, chest pain, shortness of breath, nausea, vomiting, or diarrhea.  Denies dizziness, lightheadedness, pre syncopal or syncopal episodes.   Pertinent labs: Lab Results  Component Value Date   CHOL 143 08/21/2019   HDL 47 (L) 08/21/2019   LDLCALC 79 08/21/2019   TRIG 83 08/21/2019   CHOLHDL 3.0 08/21/2019   Lab Results  Component Value Date   NA 133 (L) 05/22/2020   K 3.7 05/22/2020   CREATININE 1.09 (H) 05/22/2020   GFRNONAA 59 (L) 05/22/2020   GFRAA 66 03/20/2020   GLUCOSE 107 (H) 05/22/2020     The 10-year ASCVD risk score Mikey Bussing DC Jr., et al., 2013) is: 20.1%   ---------------------------------------------------------------------------------------------------  Patient  Active Problem List   Diagnosis Date Noted  . Nervousness 05/28/2020  . Anxiety 05/28/2020  . Bipolar affective disorder in remission ([place]) 05/28/2020  . Cough 03/18/2020  . Bipolar disorder, in full remission, most recent episode mixed ([place]) 03/06/2020  . Need for immunization against influenza 01/27/2020  . Encounter for screening mammogram for malignant neoplasm of breast 01/27/2020  . Fatigue 01/27/2020  . Morbid obesity ([place]) 01/27/2020  . Osteoporosis 11/06/2019  . Bipolar 1 disorder, mixed, mild ([place]) 10/29/2019  . PTSD (post-traumatic stress disorder) 09/27/2019  . Bereavement 09/27/2019  . Cocaine use disorder, moderate, in sustained remission ([place]) 09/27/2019  . Status post total hip replacement, left 07/23/2019  . Primary osteoarthritis of left hip 06/14/2019  . Insulin resistance 02/18/2019  . Prediabetes 12/31/2018  . Depression 10/23/2018  . Class 3 severe obesity with serious comorbidity and body mass index (BMI) of 40.0 to 44.9 in adult ([place]) 10/23/2018  . Allergic rhinitis 08/23/2018  . Aortic atherosclerosis ([place]) 02/20/2018  . Centrilobular emphysema ([place]) 02/20/2018  . Dyslipidemia 03/28/2017  . Degenerative disc disease at L5-S1 level 09/28/2016  . Tobacco use disorder 04/21/2016  . GERD (gastroesophageal reflux disease) 12/17/2015  . Chronic back pain 10/09/2015  . Post-traumatic osteoarthritis of right knee 09/24/2015  . Alcoholism in recovery ([place]) 04/13/2015  . Vitamin D deficiency 04/09/2015  . Vitamin B12 deficiency 04/09/2015  . OSA on CPAP 04/09/2015  . Subclinical hyperthyroidism 06/26/2014  . Toxic multinodular goiter 06/26/2014  .  Essential hypertension 06/26/2014  . Tobacco use disorder, continuous 06/26/2014  . Bipolar I disorder ([place]) 06/26/2014  . Lumbar radiculitis 10/03/2013  . Diverticulosis 08/24/2013   Past Medical History:  Diagnosis Date  . ADD (attention deficit disorder)   . Alcohol abuse   . Anemia   . Anxiety   . Aortic  atherosclerosis ([place]) 02/20/2018   Chest CT Sept 2019  . Arthritis    rheumatoid arthritis  . Asthma   . Bipolar disorder ([place])   . Centrilobular emphysema ([place]) 02/20/2018   Chest CT Sept 2019  . Constipation   . Degenerative disc disease at L5-S1 level 09/28/2016   See ortho note May 2018  . Depression    bipolar, hx of suicide attempt  . Drug use   . Dyspnea    with exertion  . GERD (gastroesophageal reflux disease)   . H/O suicide attempt    slit wrists  . Hepatitis C 06/26/2014  . Hip pain   . History of alcohol abuse   . History of diverticulitis 2013  . History of hepatitis C    HEP "C"--three years ago  . History of MRSA infection 2013  . HLD (hyperlipidemia)   . Hypertension   . Hypothyroidism   . Incisional hernia 11/08/2012  . Knee pain   . 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 Multinodular thyroid   . OSA (obstructive sleep apnea)    managed by Dr. Manuella Ghazi  . Osteoporosis   . Post-traumatic osteoarthritis of right knee 09/24/2015  . Prediabetes   . Recurrent ventral hernia 11/08/2012  . Sleep apnea     Dx 3 years ago. Use C-PAP  . 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 Thyroid disease    Goiter  . Vitamin B12 deficiency   . Vitamin D deficiency disease    Allergies  Allergen Reactions  . Wellbutrin [Bupropion]     Patient reports made " deathly sick " years ago at appointment 03/18/20.  . Lasix [Furosemide] Other (See Comments)    Electrolyte imbalance       Medications: Outpatient Medications Prior to Visit  Medication Sig  . albuterol (VENTOLIN HFA) 108 (90 Base) MCG/ACT inhaler Inhale 2 puffs into the lungs every 4 (four) hours as needed for wheezing or shortness of breath.  Marland Kitchen atorvastatin (LIPITOR) 10 MG tablet Take 1 tablet (10 mg total) by mouth at bedtime.  . clindamycin (CLEOCIN T) 1 % external solution Apply to aa's scalp QD PRN  . clotrimazole (LOTRIMIN) 1 % cream Apply 1 application topically 2 (two) times daily.  .  cyclobenzaprine (FLEXERIL) 5 MG tablet Take 5 mg by mouth 2 (two) times daily as needed for muscle spasms.   . DULoxetine (CYMBALTA) 60 MG capsule Take 1 capsule (60 mg total) by mouth 2 (two) times daily.  . Fluticasone-Umeclidin-Vilant (TRELEGY ELLIPTA) 100-62.5-25 MCG/INH AEPB Inhale 1 puff into the lungs daily.  Marland Kitchen gabapentin (NEURONTIN) 300 MG capsule Take 600 mg by mouth 2 (two) times daily.   . hydrochlorothiazide (HYDRODIURIL) 25 MG tablet Take 1 tablet (25 mg total) by mouth daily.  Marland Kitchen HYDROcodone-acetaminophen (NORCO/VICODIN) 5-325 MG tablet 1 po qday prn.  . ketoconazole (NIZORAL) 2 % shampoo Shampoo into the scalp let sit 10 minutes then wash out. Use 3d/wk.  Marland Kitchen losartan (COZAAR) 100 MG tablet Take 1 tablet (100 mg total) by mouth daily.  . Lurasidone HCl (LATUDA) 60 MG TABS Take 1 tablet (60 mg total) by mouth daily with supper.  Marland Kitchen  meloxicam (MOBIC) 15 MG tablet Take by mouth.  . predniSONE (DELTASONE) 10 MG tablet Take as directed - 12 day taper  . umeclidinium-vilanterol (ANORO ELLIPTA) 62.5-25 MCG/INH AEPB Inhale 1 puff into the lungs daily.  . vitamin B-12 (CYANOCOBALAMIN) 1000 MCG tablet Take 1,000 mcg by mouth daily.   . Vitamin D, Ergocalciferol, (DRISDOL) 1.25 MG (50000 UNIT) CAPS capsule Take 1 capsule (50,000 Units total) by mouth every 7 (seven) days. (taking one tablet per week) recheck lab 1- 2 weeks after completion.  . [DISCONTINUED] atenolol (TENORMIN) 25 MG tablet Take 1 tablet (25 mg total) by mouth daily.  . [DISCONTINUED] hydrOXYzine (ATARAX/VISTARIL) 25 MG tablet Take 1 tablet (25 mg total) by mouth 3 (three) times daily as needed for anxiety. For breakthrough anxiety attacks  . [DISCONTINUED] loratadine (CLARITIN) 10 MG tablet Take 1 tablet (10 mg total) by mouth daily as needed for allergies. (Patient taking differently: Take 10 mg by mouth daily.)  . [DISCONTINUED] predniSONE (STERAPRED UNI-PAK 48 TAB) 10 MG (48) TBPK tablet Take by mouth.  Marland Kitchen omeprazole (PRILOSEC)  40 MG capsule Take 1 capsule (40 mg total) by mouth daily.   No facility-administered medications prior to visit.    Review of Systems  Constitutional: Positive for fatigue. Negative for activity change, appetite change, chills, diaphoresis, fever and unexpected weight change.  HENT: Negative.   Eyes: Negative.   Respiratory: Negative.   Cardiovascular: Negative.   Gastrointestinal: Negative.   Endocrine: Negative.   Genitourinary: Negative.   Musculoskeletal: Positive for arthralgias and back pain. Negative for gait problem, joint swelling, myalgias, neck pain and neck stiffness.  Neurological: Negative.   Hematological: Negative.   Psychiatric/Behavioral: Negative for agitation, behavioral problems, confusion, decreased concentration, dysphoric mood, hallucinations, self-injury, sleep disturbance and suicidal ideas. The patient is nervous/anxious. The patient is not hyperactive.        Objective    BP (!) 158/81   Pulse 84   Temp 98.8 F (37.1 C) (Oral)   Resp 16   Wt 243 lb (110.2 kg)   SpO2 98%   BMI 45.91 kg/m  BP Readings from Last 3 Encounters:  05/28/20 (!) 158/81  05/22/20 (!) 150/73  03/18/20 (!) 155/81   Wt Readings from Last 3 Encounters:  05/28/20 243 lb (110.2 kg)  05/22/20 239 lb (108.4 kg)  03/18/20 243 lb 3.2 oz (110.3 kg)       Physical Exam Vitals reviewed.  Constitutional:      General: She is not in acute distress.    Appearance: She is well-developed. She is obese. She is not diaphoretic.     Interventions: She is not intubated. HENT:     Head: Normocephalic and atraumatic.     Right Ear: External ear normal.     Left Ear: External ear normal.     Nose: Nose normal.     Mouth/Throat:     Pharynx: No oropharyngeal exudate.  Eyes:     General: Lids are normal. No scleral icterus.       Right eye: No discharge.        Left eye: No discharge.     Conjunctiva/sclera: Conjunctivae normal.     Right eye: Right conjunctiva is not injected.  No exudate or hemorrhage.    Left eye: Left conjunctiva is not injected. No exudate or hemorrhage.    Pupils: Pupils are equal, round, and reactive to light.  Neck:     Thyroid: No thyroid mass or thyromegaly.     Vascular: Normal  carotid pulses. No carotid bruit, hepatojugular reflux or JVD.     Trachea: Trachea and phonation normal. No tracheal tenderness or tracheal deviation.     Meningeal: Brudzinski's sign and Kernig's sign absent.  Cardiovascular:     Rate and Rhythm: Normal rate and regular rhythm.     Pulses: Normal pulses.          Radial pulses are 2+ on the right side and 2+ on the left side.       Dorsalis pedis pulses are 2+ on the right side and 2+ on the left side.       Posterior tibial pulses are 2+ on the right side and 2+ on the left side.     Heart sounds: Normal heart sounds, S1 normal and S2 normal. Heart sounds not distant. No murmur heard. No friction rub. No gallop.   Pulmonary:     Effort: Pulmonary effort is normal. No tachypnea, bradypnea, accessory muscle usage or respiratory distress. She is not intubated.     Breath sounds: Normal breath sounds. No stridor. No wheezing, rhonchi or rales.  Chest:     Chest wall: No tenderness.  Breasts:     Right: No supraclavicular adenopathy.     Left: No supraclavicular adenopathy.    Abdominal:     General: Bowel sounds are normal. There is no distension or abdominal bruit.     Palpations: Abdomen is soft. There is no shifting dullness, fluid wave, hepatomegaly, splenomegaly, mass or pulsatile mass.     Tenderness: There is no abdominal tenderness. There is no guarding or rebound.     Hernia: No hernia is present.  Musculoskeletal:        General: Tenderness present. No deformity or signs of injury. Normal range of motion.     Cervical back: Normal, full passive range of motion without pain, normal range of motion and neck supple. No edema, erythema or rigidity. No spinous process tenderness or muscular tenderness.  Normal range of motion.     Thoracic back: Normal.     Lumbar back: Normal.       Back:     Comments: Seeing specialist.   Lymphadenopathy:     Head:     Right side of head: No submental, submandibular, tonsillar, preauricular, posterior auricular or occipital adenopathy.     Left side of head: No submental, submandibular, tonsillar, preauricular, posterior auricular or occipital adenopathy.     Cervical: No cervical adenopathy.     Right cervical: No superficial, deep or posterior cervical adenopathy.    Left cervical: No superficial, deep or posterior cervical adenopathy.     Upper Body:     Right upper body: No supraclavicular or pectoral adenopathy.     Left upper body: No supraclavicular or pectoral adenopathy.  Skin:    General: Skin is warm and dry.     Coloration: Skin is not pale.     Findings: No abrasion, bruising, burn, ecchymosis, erythema, lesion, petechiae or rash.     Nails: There is no clubbing.  Neurological:     Mental Status: She is alert and oriented to person, place, and time.     GCS: GCS eye subscore is 4. GCS verbal subscore is 5. GCS motor subscore is 6.     Cranial Nerves: No cranial nerve deficit.     Sensory: No sensory deficit.     Motor: No tremor, atrophy, abnormal muscle tone or seizure activity.     Coordination: Coordination normal.  Gait: Gait normal.     Deep Tendon Reflexes: Reflexes are normal and symmetric. Reflexes normal. Babinski sign absent on the right side. Babinski sign absent on the left side.     Reflex Scores:      Tricep reflexes are 2+ on the right side and 2+ on the left side.      Bicep reflexes are 2+ on the right side and 2+ on the left side.      Brachioradialis reflexes are 2+ on the right side and 2+ on the left side.      Patellar reflexes are 2+ on the right side and 2+ on the left side.      Achilles reflexes are 2+ on the right side and 2+ on the left side. Psychiatric:        Speech: Speech normal.         Behavior: Behavior normal.        Thought Content: Thought content normal.        Judgment: Judgment normal.       No results found for any visits on 05/28/20.  Assessment & Plan     Type 2 diabetes mellitus without complication, without long-term current use of insulin (HCC) - Plan: CBC with Differential/Platelet, Comprehensive Metabolic Panel (CMET), [id number],2.63 or 0.5MG/DOS, (OZEMPIC, 0.25 OR 0.5 MG/DOSE,) 2 MG/1.5ML SOPN, HgB A1c  Essential hypertension - Plan: atenolol (TENORMIN) 25 MG tablet  Nervousness - Plan: hydrOXYzine (ATARAX/VISTARIL) 50 MG tablet  Vitamin D deficiency - Plan: VITAMIN D 25 Hydroxy (Vit-D Deficiency, Fractures)  Hypertension, unspecified type - Plan: atenolol (TENORMIN) 25 MG tablet  Anxiety  Alcoholism in recovery ([place]), Chronic  Bipolar affective disorder in remission ([place]), Chronic  Aortic atherosclerosis ([place]), Chronic  Morbid obesity ([place]), Chronic  Meds ordered this encounter  Medications  . blood glucose meter kit and supplies    Sig: Dispense based on patient and insurance preference. Use up to four times daily as directed. (FOR ICD-10 E10.9, E11.9).    Dispense:  1 each    Refill:  0    Order Specific Question:   Number of strips    Answer:   120    Order Specific Question:   Number of lancets    Answer:   120  . atenolol (TENORMIN) 25 MG tablet    Sig: Take 1.5 tablets (37.5 mg total) by mouth daily.    Dispense:  90 tablet    Refill:  3  . hydrOXYzine (ATARAX/VISTARIL) 50 MG tablet    Sig: Take 1 tablet (50 mg total) by mouth every 8 (eight) hours as needed (may cause drowsiness.).    Dispense:  90 tablet    Refill:  0  . Semaglutide,0.25 or 0.5MG/DOS, (OZEMPIC, 0.25 OR 0.5 MG/DOSE,) 2 MG/1.5ML SOPN    Sig: Inject 0.25 mg into the skin once a week for 24 doses.    Dispense:  1.5 mL    Refill:  2  monitor fasting glucose , has had elevated readings, meter script and supplied per insurance given. Low dose ozempic as above  once weekly started, discussed administration and side effects possible.  Atarax refilled for patient anxiety/ nervousness.   Increase Atenolol from 25 mg  Once daily to 37.74m once daily( she was on this previously) she is in pain with back but has been consistently still elevated, monitor and keep log at home, increased exercise as tolerated and dietary changes as discussed.    Orders Placed This Encounter  Procedures  .  CBC with Differential/Platelet  . Comprehensive Metabolic Panel (CMET)  . VITAMIN D 25 Hydroxy (Vit-D Deficiency, Fractures)  . HgB A1c   Strict return precautions advised.  Red Flags discussed. The patient was given clear instructions to go to ER or return to medical center if any red flags develop, symptoms do not improve, worsen or new problems develop. They verbalized understanding.  Return in about 3 months (around 08/26/2020), or if symptoms worsen or fail to improve, for at any time for any worsening symptoms, Go to Emergency room/ urgent care if worse.      The entirety of the information documented in the History of Present Illness, Review of Systems and Physical Exam were personally obtained by me. Portions of this information were initially documented by the CMA and reviewed by me for thoroughness and accuracy.   Marcille Buffy, [place] [phone] (phone) [phone] (fax)  [place]

## 2020-05-29 LAB — COMPREHENSIVE METABOLIC PANEL
ALT: 15 IU/L (ref 0–32)
AST: 15 IU/L (ref 0–40)
Albumin/Globulin Ratio: 2.1 (ref 1.2–2.2)
Albumin: 4.2 g/dL (ref 3.8–4.9)
Alkaline Phosphatase: 87 IU/L (ref 44–121)
BUN/Creatinine Ratio: 8 — ABNORMAL LOW (ref 9–23)
BUN: 9 mg/dL (ref 6–24)
Bilirubin Total: <0.2 mg/dL (ref 0.0–1.2)
CO2: 26 mmol/L (ref 20–29)
Calcium: 9.3 mg/dL (ref 8.7–10.2)
Chloride: 96 mmol/L (ref 96–106)
Creatinine, Ser: 1.1 mg/dL — ABNORMAL HIGH (ref 0.57–1.00)
GFR calc Af Amer: 64 mL/min/{1.73_m2} (ref >59)
GFR calc non Af Amer: 55 mL/min/{1.73_m2} — ABNORMAL LOW (ref >59)
Globulin, Total: 2 g/dL (ref 1.5–4.5)
Glucose: 111 mg/dL — ABNORMAL HIGH (ref 65–99)
Potassium: 3.8 mmol/L (ref 3.5–5.2)
Sodium: 137 mmol/L (ref 134–144)
Total Protein: 6.2 g/dL (ref 6.0–8.5)

## 2020-05-29 LAB — CBC WITH DIFFERENTIAL/PLATELET
Basophils Absolute: 0.1 10*3/uL (ref 0.0–0.2)
Basos: 1 %
EOS (ABSOLUTE): 0.3 10*3/uL (ref 0.0–0.4)
Eos: 3 %
Hematocrit: 38.7 % (ref 34.0–46.6)
Hemoglobin: 13.2 g/dL (ref 11.1–15.9)
Immature Grans (Abs): 0.1 10*3/uL (ref 0.0–0.1)
Immature Granulocytes: 1 %
Lymphocytes Absolute: 3.3 10*3/uL — ABNORMAL HIGH (ref 0.7–3.1)
Lymphs: 30 %
MCH: 31.7 pg (ref 26.6–33.0)
MCHC: 34.1 g/dL (ref 31.5–35.7)
MCV: 93 fL (ref 79–97)
Monocytes Absolute: 0.9 10*3/uL (ref 0.1–0.9)
Monocytes: 8 %
Neutrophils Absolute: 6.3 10*3/uL (ref 1.4–7.0)
Neutrophils: 57 %
Platelets: 255 10*3/uL (ref 150–450)
RBC: 4.17 x10E6/uL (ref 3.77–5.28)
RDW: 12.2 % (ref 11.7–15.4)
WBC: 10.9 10*3/uL — ABNORMAL HIGH (ref 3.4–10.8)

## 2020-05-29 LAB — HEMOGLOBIN A1C
Est. average glucose Bld gHb Est-mCnc: 128 mg/dL
Hgb A1c MFr Bld: 6.1 % — ABNORMAL HIGH (ref 4.8–5.6)

## 2020-05-29 LAB — VITAMIN D 25 HYDROXY (VIT D DEFICIENCY, FRACTURES): Vit D, 25-Hydroxy: 25.1 ng/mL — ABNORMAL LOW (ref 30.0–100.0)

## 2020-06-01 ENCOUNTER — Ambulatory Visit: Payer: Self-pay | Admitting: *Deleted

## 2020-06-01 ENCOUNTER — Other Ambulatory Visit: Payer: Self-pay | Admitting: Adult Health

## 2020-06-01 MED ORDER — VITAMIN D (ERGOCALCIFEROL) 1.25 MG (50000 UNIT) PO CAPS: 50000.0000 [IU] | ORAL_CAPSULE | ORAL | 0 refills | Status: DC

## 2020-06-01 NOTE — Telephone Encounter (Signed)
Pt given lab results per notes of M. Flinchum, FNP on 06/01/20. Pt verbalized understanding. Patient aware to have CBC rechecked in 3-4 weeks and CMP and vit D level in 3 months. Patient reports she is still waiting for pharmacy to give her blood glucose monitor refill of Ozempic. Patient will review with pharmacy when she is notified of vit D being ready.

## 2020-06-01 NOTE — Progress Notes (Signed)
Wbc mild elevation and mild lymphocyte elevation, please place order to recheck CBC in 3 to 4 weeks.  Glucose is elevated and Hemoglobin A1C remains elevated, she was started on Ozempic at last visit continue to monitor. Kidney function is stable,remain hydrated and avoid NSAIDS. Vitamin D is improved but still low, will renew prescription. Recheck CMP and Vitamin D level in 3 months.

## 2020-06-01 NOTE — Progress Notes (Signed)
Meds ordered this encounter  Medications  . Vitamin D, Ergocalciferol, (DRISDOL) 1.25 MG (50000 UNIT) CAPS capsule    Sig: Take 1 capsule (50,000 Units total) by mouth every 7 (seven) days. (taking one tablet per week) recheck lab 1- 2 weeks after completion.    Dispense:  12 capsule    Refill:  0

## 2020-06-10 ENCOUNTER — Telehealth: Payer: Self-pay | Admitting: *Deleted

## 2020-06-10 NOTE — Telephone Encounter (Signed)
Attempted to contact patient for scheduling for lung screening scan. Left message for pt to call [phone] to schedule a low dose scan.

## 2020-06-12 ENCOUNTER — Ambulatory Visit (INDEPENDENT_AMBULATORY_CARE_PROVIDER_SITE_OTHER): Payer: Medicare Other | Admitting: Psychology

## 2020-06-12 DIAGNOSIS — F3289 Other specified depressive episodes: Secondary | ICD-10-CM

## 2020-06-15 ENCOUNTER — Other Ambulatory Visit: Payer: Self-pay | Admitting: Adult Health

## 2020-06-15 NOTE — Telephone Encounter (Signed)
Requested medication (s) are due for refill today: no  Requested medication (s) are on the active medication list: yes  Last refill:  06/01/20 #12  Future visit scheduled: yes  Notes to clinic:  Please review for refill. Refill not delegated per protocol    Requested Prescriptions  Pending Prescriptions Disp Refills   Vitamin D, Ergocalciferol, (DRISDOL) 1.25 MG (50000 UNIT) CAPS capsule [Pharmacy Med Name: Vitamin D (Ergocalciferol) 1.25 MG(50000 UT) CAPS] 12 capsule 0    Sig: TAKE 1 CAPSULE BY MOUTH EVERY SEVEN DAYS. RECHECK LAB 1-2 WEEKS AFTER COMPLETION      Endocrinology:  Vitamins - Vitamin D Supplementation Failed - 06/15/2020  2:43 PM      Failed - 50,000 IU strengths are not delegated      Failed - Phosphate in normal range and within 360 days    No results found for: PHOS        Failed - Vitamin D in normal range and within 360 days    Vit D, 25-Hydroxy  Date Value Ref Range Status  05/28/2020 25.1 (L) 30.0 - 100.0 ng/mL Final    Comment:    Vitamin D deficiency has been defined by the Institute of Medicine and an Endocrine Society practice guideline as a level of serum 25-OH vitamin D less than 20 ng/mL (1,2). The Endocrine Society went on to further define vitamin D insufficiency as a level between 21 and 29 ng/mL (2). 1. IOM (Institute of Medicine). 2010. Dietary reference    intakes for calcium and D. [place]: The    [employer]. 2. Holick MF, [employer], Bischoff-Ferrari HA, et al.    Evaluation, treatment, and prevention of vitamin D    deficiency: an Endocrine Society clinical practice    guideline. JCEM. 2011 Jul; 96(7):1911-30.           Passed - Ca in normal range and within 360 days    Calcium  Date Value Ref Range Status  05/28/2020 9.3 8.7 - 10.2 mg/dL Final   Calcium, Total  Date Value Ref Range Status  04/29/2014 9.1 8.5 - 10.1 mg/dL Final          Passed - Valid encounter within last 12 months    Recent Outpatient  Visits           2 weeks ago Type 2 diabetes mellitus without complication, without long-term current use of insulin ([place])   [place], FNP   2 months ago Weight gain   [facility], Kelby Aline, FNP   4 months ago Essential hypertension   [facility], Kelby Aline, FNP   5 months ago Essential hypertension   [employer], Adriana M, PA-C   6 months ago Screening for blood or protein in urine   [place], Kelby Aline, FNP       Future Appointments             In 2 months Flinchum, Kelby Aline, [place], [place]

## 2020-06-18 ENCOUNTER — Telehealth: Payer: Self-pay

## 2020-06-18 NOTE — Telephone Encounter (Signed)
Attempted to contact patient for scheduling lung screening scan. Left message for patient to call [phone] to schedule CT scan.

## 2020-06-19 ENCOUNTER — Other Ambulatory Visit: Payer: Self-pay | Admitting: Adult Health

## 2020-06-19 DIAGNOSIS — I10 Essential (primary) hypertension: Secondary | ICD-10-CM

## 2020-06-22 ENCOUNTER — Other Ambulatory Visit: Payer: Self-pay | Admitting: Adult Health

## 2020-06-22 DIAGNOSIS — R450 Nervousness: Secondary | ICD-10-CM

## 2020-06-24 ENCOUNTER — Telehealth: Payer: Self-pay | Admitting: Adult Health

## 2020-06-24 NOTE — Telephone Encounter (Signed)
Medication Refill - Medication:  ACCU-CHEK GUIDE test strip  Accu-Chek Softclix Lancets lancets      Preferred Pharmacy (with phone number or street name):  [id number] - [place], [place] [address] Phone:  [phone]  Fax:  [phone]       Agent: Please be advised that RX refills may take up to 3 business days. We ask that you follow-up with your pharmacy.

## 2020-06-24 NOTE — Telephone Encounter (Signed)
pls be advised pt got items in the mail today and ask to cancel.

## 2020-06-24 NOTE — Telephone Encounter (Signed)
Noted that Pt received her prescriptions today in the mail and to cancel  The refill request she requested earlier today.

## 2020-07-03 ENCOUNTER — Telehealth (INDEPENDENT_AMBULATORY_CARE_PROVIDER_SITE_OTHER): Payer: Medicare Other | Admitting: Psychiatry

## 2020-07-03 ENCOUNTER — Encounter: Payer: Self-pay | Admitting: Psychiatry

## 2020-07-03 ENCOUNTER — Other Ambulatory Visit: Payer: Self-pay

## 2020-07-03 DIAGNOSIS — F1421 Cocaine dependence, in remission: Secondary | ICD-10-CM

## 2020-07-03 DIAGNOSIS — F4310 Post-traumatic stress disorder, unspecified: Secondary | ICD-10-CM

## 2020-07-03 DIAGNOSIS — F3178 Bipolar disorder, in full remission, most recent episode mixed: Secondary | ICD-10-CM | POA: Diagnosis not present

## 2020-07-03 DIAGNOSIS — Z634 Disappearance and death of family member: Secondary | ICD-10-CM

## 2020-07-03 DIAGNOSIS — F1021 Alcohol dependence, in remission: Secondary | ICD-10-CM

## 2020-07-03 DIAGNOSIS — F17200 Nicotine dependence, unspecified, uncomplicated: Secondary | ICD-10-CM | POA: Diagnosis not present

## 2020-07-03 MED ORDER — LATUDA 60 MG PO TABS: 60.0000 mg | ORAL_TABLET | Freq: Every day | ORAL | 1 refills | Status: DC

## 2020-07-03 NOTE — Progress Notes (Addendum)
Virtual Visit via Telephone Note  I connected with Susan [name] on 07/03/20 at 10:00 AM EST by telephone and verified that I am speaking with the correct person using two identifiers. Location Provider Location : [facility] Patient Location : Home  Participants: Patient , Provider    I discussed the limitations, risks, security and privacy concerns of performing an evaluation and management service by telephone and the availability of in person appointments. I also discussed with the patient that there may be a patient responsible charge related to this service. The patient expressed understanding and agreed to proceed.   I discussed the assessment and treatment plan with the patient. The patient was provided an opportunity to ask questions and all were answered. The patient agreed with the plan and demonstrated an understanding of the instructions.   The patient was advised to call back or seek an in-person evaluation if the symptoms worsen or if the condition fails to improve as anticipated.   [place] MD OP Progress Note  07/03/2020 9:02 PM Susan [name]  MRN:  [id number]  Chief Complaint:  Chief Complaint    Follow-up     HPI: Susan [name] is a 60 year old Caucasian female, widowed, disabled, lives in [place], has a history of bipolar disorder, alcohol and cocaine use disorder in remission, PTSD, bereavement, degenerative disc disease, obstructive sleep apnea on CPAP, status post total hip replacement, osteoarthritis, hepatitis C was evaluated by telemedicine today.  Patient today reports she is currently making progress with regards to her mood.  She reports she is compliant on the Latuda, Cymbalta.  Denies side effects.  She reports sleep is improving.  She reports she was able to get a procedure done for her back pain recently and that has tremendously helped her to be more active.  Patient denies any hallucinations.  She denies any suicidality or homicidality.  She  continues to go for [name] and reports that helps her a lot.  She has good support system from her sponsor as well as her brother.  She reports it is her son's death anniversary coming up on March 13.  She reports she is anxious about how she will get through it however already has come up with plan to celebrate that day.  She is also agreeable to getting in touch with her therapist Mr. Buena Irish.  Patient denies any other concerns today.  Visit Diagnosis:    ICD-10-CM   1. Bipolar disorder, in full remission, most recent episode mixed (HCC)  F31.78 Lurasidone HCl (LATUDA) 60 MG TABS  2. PTSD (post-traumatic stress disorder)  F43.10   3. Bereavement  Z63.4   4. Tobacco use disorder  F17.200   5. Alcohol use disorder, severe, in sustained remission ([place])  F10.21   6. Cocaine use disorder, moderate, in sustained remission ([place])  F14.21     Past Psychiatric History: I have reviewed past psychiatric history from my progress note on 09/27/2019.  Past trials of Seroquel, Cymbalta, Abilify, risperidone  Past Medical History:  Past Medical History:  Diagnosis Date  . ADD (attention deficit disorder)   . Alcohol abuse   . Anemia   . Anxiety   . Aortic atherosclerosis ([place]) 02/20/2018   Chest CT Sept 2019  . Arthritis    rheumatoid arthritis  . Asthma   . Bipolar disorder ([place])   . Centrilobular emphysema ([place]) 02/20/2018   Chest CT Sept 2019  . Constipation   . Degenerative disc disease at L5-S1 level 09/28/2016  See ortho note May 2018  . Depression    bipolar, hx of suicide attempt  . Drug use   . Dyspnea    with exertion  . GERD (gastroesophageal reflux disease)   . H/O suicide attempt    slit wrists  . Hepatitis C 06/26/2014  . Hip pain   . History of alcohol abuse   . History of diverticulitis 2013  . History of hepatitis C    HEP "C"--three years ago  . History of MRSA infection 2013  . HLD (hyperlipidemia)   . Hypertension   . Hypothyroidism   . Incisional  hernia 11/08/2012  . Knee pain   . 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 Multinodular thyroid   . OSA (obstructive sleep apnea)    managed by Dr. Manuella Ghazi  . Osteoporosis   . Post-traumatic osteoarthritis of right knee 09/24/2015  . Prediabetes   . Recurrent ventral hernia 11/08/2012  . Sleep apnea     Dx 3 years ago. Use C-PAP  . 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 Thyroid disease    Goiter  . Vitamin B12 deficiency   . Vitamin D deficiency disease     Past Surgical History:  Procedure Laterality Date  . BILATERAL SALPINGOOPHORECTOMY     due to abnormal mass  . BREAST BIOPSY Left    neg  . BREAST SURGERY Left 20 yrs ago  . CESAREAN SECTION    . COLONOSCOPY    . COLONOSCOPY WITH PROPOFOL N/A 10/16/2017   Procedure: COLONOSCOPY WITH PROPOFOL;  Surgeon: Jonathon Bellows, MD;  Location: [facility] ENDOSCOPY;  Service: Gastroenterology;  Laterality: N/A;  . HERNIA REPAIR  06/2011, July 2014   Ventral wall repair with Physiomesh  . HERNIA REPAIR     2nd.vental wall repair  . JOINT REPLACEMENT Right    knee  . TONSILLECTOMY    . TOTAL HIP ARTHROPLASTY Left 07/23/2019   Procedure: TOTAL HIP ARTHROPLASTY;  Surgeon: Corky Mull, MD;  Location: [facility] ORS;  Service: Orthopedics;  Laterality: Left;  . TOTAL KNEE ARTHROPLASTY Right 05/10/2016   Procedure: TOTAL KNEE ARTHROPLASTY;  Surgeon: Corky Mull, MD;  Location: [facility] ORS;  Service: Orthopedics;  Laterality: Right;  . TUBAL LIGATION      Family Psychiatric History: I have reviewed family psychiatric history from my progress note on 09/27/2019  Family History:  Family History  Problem Relation Age of Onset  . Alcohol abuse Father   . Heart attack Father   . Arthritis Mother   . Asthma Mother   . Mental illness Mother   . Thyroid disease Mother   . COPD Mother   . Heart disease Mother   . Congestive Heart Failure Mother   . Alcohol abuse Mother   . Eating disorder Mother   . Bipolar disorder Mother   . Arthritis Brother    . Mental illness Brother   . Cancer Brother        non-hodkins lymphoma  . Alcohol abuse Sister   . Drug abuse Sister   . Mental illness Sister   . Mental illness Sister   . Fibromyalgia Sister   . Obesity Sister   . Pneumonia Sister   . Depression Sister   . Mental illness Sister   . Alcohol abuse Sister   . Drug abuse Sister   . Drug abuse Son   . Alcohol abuse Son   . Drug abuse Son   . Alcohol abuse Son   .  Drug abuse Daughter   . Diabetes Neg Hx   . Stroke Neg Hx   . Breast cancer Neg Hx     Social History: I have reviewed social history from my progress note on 09/27/2019 Social History   Socioeconomic History  . Marital status: Widowed    Spouse name: Not on file  . Number of children: 3  . Years of education: Not on file  . Highest education level: Some college, no degree  Occupational History  . Occupation: disabled  Tobacco Use  . Smoking status: Current Every Day Smoker    Packs/day: 1.00    Years: 41.00    Pack years: 41.00    Types: Cigarettes  . Smokeless tobacco: Never Used  . Tobacco comment: Pt to discuss trying the patch with PCP at [place] in office apt.   Vaping Use  . Vaping Use: Former  . Quit date: 07/15/2017  Substance and Sexual Activity  . Alcohol use: No    Alcohol/week: 0.0 standard drinks    Comment: sober since October 2018  . 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 Sexual activity: Not Currently  Other Topics Concern  . Not on file  Social History Narrative  . Not on file   Social Determinants of Health   Financial Resource Strain: Low Risk   . Difficulty of Paying Living Expenses: Not hard at all  Food Insecurity: No Food Insecurity  . Worried About Charity fundraiser in the Last Year: Never true  . Ran Out of Food in the Last Year: Never true  Transportation Needs: No Transportation Needs  . Lack of Transportation (Medical): No  . Lack of Transportation (Non-Medical): No  Physical  Activity: Inactive  . Days of Exercise per Week: 0 days  . Minutes of Exercise per Session: 0 min  Stress: No Stress Concern Present  . Feeling of Stress : Only a little  Social Connections: Moderately Isolated  . Frequency of Communication with Friends and Family: More than three times a week  . Frequency of Social Gatherings with Friends and Family: More than three times a week  . Attends Religious Services: Never  . Active Member of Clubs or Organizations: Yes  . Attends Archivist Meetings: More than 4 times per year  . Marital Status: Widowed    Allergies:  Allergies  Allergen Reactions  . Wellbutrin [Bupropion]     Patient reports made " deathly sick " years ago at appointment 03/18/20.  . Lasix [Furosemide] Other (See Comments)    Electrolyte imbalance    Metabolic Disorder Labs: Lab Results  Component Value Date   HGBA1C 6.1 (H) 05/28/2020   MPG 108 04/08/2019   MPG 103 08/16/2017   No results found for: PROLACTIN Lab Results  Component Value Date   CHOL 143 08/21/2019   TRIG 83 08/21/2019   HDL 47 (L) 08/21/2019   CHOLHDL 3.0 08/21/2019   VLDL 27 10/14/2016   LDLCALC 79 08/21/2019   LDLCALC 79 04/08/2019   Lab Results  Component Value Date   TSH 1.09 10/16/2019   TSH 1.430 07/26/2018    Therapeutic Level Labs: No results found for: LITHIUM No results found for: VALPROATE No components found for:  CBMZ  Current Medications: Current Outpatient Medications  Medication Sig Dispense Refill  . ACCU-CHEK GUIDE test strip TEST UP TO 4 TIMES A DAY 100 each 0  . Accu-Chek Softclix Lancets lancets TEST 4 TIMES  A DAY 100 each 0  . albuterol (VENTOLIN HFA) 108 (90 Base) MCG/ACT inhaler Inhale 2 puffs into the lungs every 4 (four) hours as needed for wheezing or shortness of breath. 18 g 2  . atenolol (TENORMIN) 25 MG tablet Take 1.5 tablets (37.5 mg total) by mouth daily. 90 tablet 3  . atorvastatin (LIPITOR) 10 MG tablet Take 1 tablet (10 mg total) by  mouth at bedtime. 90 tablet 3  . blood glucose meter kit and supplies Dispense based on patient and insurance preference. Use up to four times daily as directed. (FOR ICD-10 E10.9, E11.9). 1 each 0  . clindamycin (CLEOCIN T) 1 % external solution Apply to aa's scalp QD PRN 60 mL 3  . clotrimazole (LOTRIMIN) 1 % cream Apply 1 application topically 2 (two) times daily. 30 g 0  . cyclobenzaprine (FLEXERIL) 5 MG tablet Take 5 mg by mouth 2 (two) times daily as needed for muscle spasms.     . DULoxetine (CYMBALTA) 60 MG capsule Take 1 capsule (60 mg total) by mouth 2 (two) times daily. 180 capsule 3  . Fluticasone-Umeclidin-Vilant (TRELEGY ELLIPTA) 100-62.5-25 MCG/INH AEPB Inhale 1 puff into the lungs daily. 28 each 11  . gabapentin (NEURONTIN) 300 MG capsule Take 600 mg by mouth 2 (two) times daily.     . hydrochlorothiazide (HYDRODIURIL) 25 MG tablet TAKE 1 TABLET (25 MG TOTAL) BY MOUTH DAILY. 90 tablet 0  . HYDROcodone-acetaminophen (NORCO/VICODIN) 5-325 MG tablet 1 po qday prn.    . hydrOXYzine (ATARAX/VISTARIL) 50 MG tablet Take 1 tablet (50 mg total) by mouth every 8 (eight) hours as needed (may cause drowsiness.). 90 tablet 0  . ketoconazole (NIZORAL) 2 % shampoo Shampoo into the scalp let sit 10 minutes then wash out. Use 3d/wk. 120 mL 2  . losartan (COZAAR) 100 MG tablet Take 1 tablet (100 mg total) by mouth daily. 90 tablet 3  . Lurasidone HCl (LATUDA) 60 MG TABS Take 1 tablet (60 mg total) by mouth daily with supper. 30 tablet 1  . meloxicam (MOBIC) 15 MG tablet Take by mouth.    Marland Kitchen omeprazole (PRILOSEC) 40 MG capsule Take 1 capsule (40 mg total) by mouth daily. 90 capsule 3  . predniSONE (DELTASONE) 10 MG tablet Take as directed - 12 day taper    . Semaglutide,0.25 or 0.5MG/DOS, (OZEMPIC, 0.25 OR 0.5 MG/DOSE,) 2 MG/1.5ML SOPN Inject 0.25 mg into the skin once a week for 24 doses. 1.5 mL 2  . umeclidinium-vilanterol (ANORO ELLIPTA) 62.5-25 MCG/INH AEPB Inhale 1 puff into the lungs daily. 60  each 5  . vitamin B-12 (CYANOCOBALAMIN) 1000 MCG tablet Take 1,000 mcg by mouth daily.     . Vitamin D, Ergocalciferol, (DRISDOL) 1.25 MG (50000 UNIT) CAPS capsule TAKE 1 CAPSULE BY MOUTH EVERY SEVEN DAYS. RECHECK LAB 1-2 WEEKS AFTER COMPLETION 12 capsule 0   No current facility-administered medications for this visit.     Musculoskeletal: Strength & Muscle Tone: UTA Gait & Station: UTA Patient leans: N/A  Psychiatric Specialty Exam: Review of Systems  Musculoskeletal: Positive for back pain (improved).  Psychiatric/Behavioral: Positive for dysphoric mood and sleep disturbance.  All other systems reviewed and are negative.   There were no vitals taken for this visit.There is no height or weight on file to calculate BMI.  General Appearance: UTA  Eye Contact:  UTA  Speech:  Clear and Coherent  Volume:  Normal  Mood:  Depressed improving  Affect:  UTA  Thought Process:  Goal Directed and  Descriptions of Associations: Intact  Orientation:  Full (Time, Place, and Person)  Thought Content: Logical   Suicidal Thoughts:  No  Homicidal Thoughts:  No  Memory:  Immediate;   Fair Recent;   Fair Remote;   Fair  Judgement:  Fair  Insight:  Fair  Psychomotor Activity:  UTA  Concentration:  Concentration: Fair and Attention Span: Fair  Recall:  [employer] of Knowledge: Fair  Language: Fair  Akathisia:  No  Handed:  Right  AIMS (if indicated): UTA  Assets:  Communication Skills Desire for Improvement Housing Social Support  ADL's:  Intact  Cognition: WNL  Sleep:  Improving   Screenings: GAD-7   Flowsheet Row Office Visit from 11/06/2019 in [facility] Office Visit from 09/06/2018 in [place]  Total GAD-7 Score 21 14    PHQ2-9   Flowsheet Row Video Visit from 07/03/2020 in [place] Office Visit from 05/28/2020 in [place] from 05/27/2020 in [place]  Visit from 11/06/2019 in [facility] Office Visit from 10/16/2019 in [facility]  PHQ-2 Total Score _0 PHQ-9 Total Score -- 6 -- 13 23    Flowsheet Row Video Visit from 07/03/2020 in [place] Error: Q6 is Yes, you must answer 7       Assessment and Plan: Susan [name] is a 60 year old Caucasian female who has a history of bipolar disorder, degenerative disc disease, multiple medical problems was evaluated by telemedicine today.  Patient is biologically predisposed given her family history of mental health problems, history of substance abuse problems and trauma.  Patient is currently making progress on the current medication regimen.  Plan as noted below.  Plan Bipolar disorder in remission Latuda 60 mg p.o. daily with supper Cymbalta as prescribed  PTSD-stable Cymbalta 60 mg p.o. daily Hydroxyzine 25 mg p.o. 3 times daily as needed for breakthrough anxiety Continue CBT with her therapist-Mr.Mostafa  Bereavement-improving Continue grief counseling  Tobacco use disorder-improving Provided counseling  Alcohol and cocaine use disorder in remission Patient continues to be active in [name].  Patient reports that the death anniversary of her son is coming up on March 13.  Patient to contact her therapist for more frequent psychotherapy sessions.  Patient will also have a follow-up visit with writer in office prior to that.  I have spent atleast 20 minutes non face to face with patient today. More than 50 % of the time was spent for preparing to see the patient ( e.g., review of test, records ), ordering medications and test ,psychoeducation and supportive psychotherapy and care coordination,as well as documenting clinical information in electronic health record. This note was generated in part or whole with voice recognition software. Voice recognition is usually quite accurate but  there are transcription errors that can and very often do occur. I apologize for any typographical errors that were not detected and corrected.      Ursula Alert, MD 07/03/2020, 9:02 PM

## 2020-07-03 NOTE — Patient Instructions (Signed)

## 2020-07-04 NOTE — Progress Notes (Signed)
corrected

## 2020-07-10 ENCOUNTER — Other Ambulatory Visit: Payer: Self-pay | Admitting: Adult Health

## 2020-07-10 MED ORDER — VITAMIN D (ERGOCALCIFEROL) 1.25 MG (50000 UNIT) PO CAPS: ORAL_CAPSULE | ORAL | 0 refills | Status: DC

## 2020-07-13 ENCOUNTER — Other Ambulatory Visit: Payer: Self-pay | Admitting: Adult Health

## 2020-07-15 ENCOUNTER — Encounter: Payer: Self-pay | Admitting: Psychiatry

## 2020-07-15 ENCOUNTER — Other Ambulatory Visit: Payer: Self-pay

## 2020-07-15 ENCOUNTER — Telehealth (INDEPENDENT_AMBULATORY_CARE_PROVIDER_SITE_OTHER): Payer: Medicare Other | Admitting: Psychiatry

## 2020-07-15 DIAGNOSIS — Z634 Disappearance and death of family member: Secondary | ICD-10-CM

## 2020-07-15 DIAGNOSIS — F1021 Alcohol dependence, in remission: Secondary | ICD-10-CM

## 2020-07-15 DIAGNOSIS — F3178 Bipolar disorder, in full remission, most recent episode mixed: Secondary | ICD-10-CM

## 2020-07-15 DIAGNOSIS — F17200 Nicotine dependence, unspecified, uncomplicated: Secondary | ICD-10-CM | POA: Diagnosis not present

## 2020-07-15 DIAGNOSIS — F4310 Post-traumatic stress disorder, unspecified: Secondary | ICD-10-CM | POA: Diagnosis not present

## 2020-07-15 DIAGNOSIS — F1421 Cocaine dependence, in remission: Secondary | ICD-10-CM

## 2020-07-15 NOTE — Progress Notes (Signed)
Virtual Visit via Video Note  I connected with Susan [name] on 07/15/20 at  4:20 PM EST by a video enabled telemedicine application and verified that I am speaking with the correct person using two identifiers.  Location Provider Location : [facility] Patient Location : Home  Participants: Patient , Provider    I discussed the limitations of evaluation and management by telemedicine and the availability of in person appointments. The patient expressed understanding and agreed to proceed.    I discussed the assessment and treatment plan with the patient. The patient was provided an opportunity to ask questions and all were answered. The patient agreed with the plan and demonstrated an understanding of the instructions.   The patient was advised to call back or seek an in-person evaluation if the symptoms worsen or if the condition fails to improve as anticipated.  [place] MD OP Progress Note  07/15/2020 4:36 PM Susan [name]  MRN:  [id number]  Chief Complaint:  Chief Complaint    Follow-up; Depression     HPI: Susan [name] is a 60 year old Caucasian female, widowed, disabled, lives in [place], has a history of bipolar disorder, alcohol and cocaine use disorder in remission, PTSD, bereavement, degenerative disc disease, obstructive sleep apnea on CPAP ,status post total hip replacement, osteoarthritis, hepatitis C was evaluated by telemedicine today.  Patient is currently grieving the loss of her son.  It is her son's death anniversary coming up on March 13.  Patient however today reports she is coping okay.  She is planning to spend the day with her daughter and her grandchildren.  She is also planning to go for AA meeting.  She reports she is not going to keep herself isolated that day and has plans.  Patient overall is doing well with regards to her mood.  She reports sleep is okay.  She had a procedure done to her back yesterday ( neurotomy) and she is currently in pain.  She  however reports she has continued follow-up with her pain provider Dr. Santa Lighter.  Patient denies any suicidality, homicidality or perceptual disturbances.  Patient denies any hallucinations.  She is compliant on medications and denies side effects.  Visit Diagnosis:    ICD-10-CM   1. Bipolar disorder, in full remission, most recent episode mixed ([place])  F31.78   2. PTSD (post-traumatic stress disorder)  F43.10   3. Bereavement  Z63.4   4. Tobacco use disorder  F17.200   5. Alcohol use disorder, severe, in sustained remission ([place])  F10.21   6. Cocaine use disorder, moderate, in sustained remission ([place])  F14.21     Past Psychiatric History: I have reviewed past psychiatric history from my progress note from 09/27/2019.  Past trials of Seroquel, Cymbalta, Abilify, risperidone  Past Medical History:  Past Medical History:  Diagnosis Date  . ADD (attention deficit disorder)   . Alcohol abuse   . Anemia   . Anxiety   . Aortic atherosclerosis ([place]) 02/20/2018   Chest CT Sept 2019  . Arthritis    rheumatoid arthritis  . Asthma   . Bipolar disorder ([place])   . Centrilobular emphysema ([place]) 02/20/2018   Chest CT Sept 2019  . Constipation   . Degenerative disc disease at L5-S1 level 09/28/2016   See ortho note May 2018  . Depression    bipolar, hx of suicide attempt  . Drug use   . Dyspnea    with exertion  . GERD (gastroesophageal reflux disease)   . H/O suicide  attempt    slit wrists  . Hepatitis C 06/26/2014  . Hip pain   . History of alcohol abuse   . History of diverticulitis 2013  . History of hepatitis C    HEP "C"--three years ago  . History of MRSA infection 2013  . HLD (hyperlipidemia)   . Hypertension   . Hypothyroidism   . Incisional hernia 11/08/2012  . Knee pain   . 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 Multinodular thyroid   . OSA (obstructive sleep apnea)    managed by Dr. Manuella Ghazi  . Osteoporosis   . Post-traumatic osteoarthritis of right knee 09/24/2015  . Prediabetes    . Recurrent ventral hernia 11/08/2012  . Sleep apnea     Dx 3 years ago. Use C-PAP  . 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 Thyroid disease    Goiter  . Vitamin B12 deficiency   . Vitamin D deficiency disease     Past Surgical History:  Procedure Laterality Date  . BILATERAL SALPINGOOPHORECTOMY     due to abnormal mass  . BREAST BIOPSY Left    neg  . BREAST SURGERY Left 20 yrs ago  . CESAREAN SECTION    . COLONOSCOPY    . COLONOSCOPY WITH PROPOFOL N/A 10/16/2017   Procedure: COLONOSCOPY WITH PROPOFOL;  Surgeon: Jonathon Bellows, MD;  Location: [facility] ENDOSCOPY;  Service: Gastroenterology;  Laterality: N/A;  . HERNIA REPAIR  06/2011, July 2014   Ventral wall repair with Physiomesh  . HERNIA REPAIR     2nd.vental wall repair  . JOINT REPLACEMENT Right    knee  . TONSILLECTOMY    . TOTAL HIP ARTHROPLASTY Left 07/23/2019   Procedure: TOTAL HIP ARTHROPLASTY;  Surgeon: Corky Mull, MD;  Location: [facility] ORS;  Service: Orthopedics;  Laterality: Left;  . TOTAL KNEE ARTHROPLASTY Right 05/10/2016   Procedure: TOTAL KNEE ARTHROPLASTY;  Surgeon: Corky Mull, MD;  Location: [facility] ORS;  Service: Orthopedics;  Laterality: Right;  . TUBAL LIGATION      Family Psychiatric History: I have reviewed family psychiatric history from my progress note on 09/27/2019  Family History:  Family History  Problem Relation Age of Onset  . Alcohol abuse Father   . Heart attack Father   . Arthritis Mother   . Asthma Mother   . Mental illness Mother   . Thyroid disease Mother   . COPD Mother   . Heart disease Mother   . Congestive Heart Failure Mother   . Alcohol abuse Mother   . Eating disorder Mother   . Bipolar disorder Mother   . Arthritis Brother   . Mental illness Brother   . Cancer Brother        non-hodkins lymphoma  . Alcohol abuse Sister   . Drug abuse Sister   . Mental illness Sister   . Mental illness Sister   . Fibromyalgia Sister   . Obesity Sister   .  Pneumonia Sister   . Depression Sister   . Mental illness Sister   . Alcohol abuse Sister   . Drug abuse Sister   . Drug abuse Son   . Alcohol abuse Son   . Drug abuse Son   . Alcohol abuse Son   . Drug abuse Daughter   . Diabetes Neg Hx   . Stroke Neg Hx   . Breast cancer Neg Hx     Social History: I have reviewed social history from my progress note on 09/27/2019 Social  History   Socioeconomic History  . Marital status: Widowed    Spouse name: Not on file  . Number of children: 3  . Years of education: Not on file  . Highest education level: Some college, no degree  Occupational History  . Occupation: disabled  Tobacco Use  . Smoking status: Current Every Day Smoker    Packs/day: 1.00    Years: 41.00    Pack years: 41.00    Types: Cigarettes  . Smokeless tobacco: Never Used  . Tobacco comment: Pt to discuss trying the patch with PCP at [facility] in office apt.   Vaping Use  . Vaping Use: Former  . Quit date: 07/15/2017  Substance and Sexual Activity  . Alcohol use: No    Alcohol/week: 0.0 standard drinks    Comment: sober since October 2018  . 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 Sexual activity: Not Currently  Other Topics Concern  . Not on file  Social History Narrative  . Not on file   Social Determinants of Health   Financial Resource Strain: Low Risk   . Difficulty of Paying Living Expenses: Not hard at all  Food Insecurity: No Food Insecurity  . Worried About Charity fundraiser in the Last Year: Never true  . Ran Out of Food in the Last Year: Never true  Transportation Needs: No Transportation Needs  . Lack of Transportation (Medical): No  . Lack of Transportation (Non-Medical): No  Physical Activity: Inactive  . Days of Exercise per Week: 0 days  . Minutes of Exercise per Session: 0 min  Stress: No Stress Concern Present  . Feeling of Stress : Only a little  Social Connections: Moderately Isolated  . Frequency  of Communication with Friends and Family: More than three times a week  . Frequency of Social Gatherings with Friends and Family: More than three times a week  . Attends Religious Services: Never  . Active Member of Clubs or Organizations: Yes  . Attends Archivist Meetings: More than 4 times per year  . Marital Status: Widowed    Allergies:  Allergies  Allergen Reactions  . Wellbutrin [Bupropion]     Patient reports made " deathly sick " years ago at appointment 03/18/20.  . Lasix [Furosemide] Other (See Comments)    Electrolyte imbalance    Metabolic Disorder Labs: Lab Results  Component Value Date   HGBA1C 6.1 (H) 05/28/2020   MPG 108 04/08/2019   MPG 103 08/16/2017   No results found for: PROLACTIN Lab Results  Component Value Date   CHOL 143 08/21/2019   TRIG 83 08/21/2019   HDL 47 (L) 08/21/2019   CHOLHDL 3.0 08/21/2019   VLDL 27 10/14/2016   LDLCALC 79 08/21/2019   LDLCALC 79 04/08/2019   Lab Results  Component Value Date   TSH 1.09 10/16/2019   TSH 1.430 07/26/2018    Therapeutic Level Labs: No results found for: LITHIUM No results found for: VALPROATE No components found for:  CBMZ  Current Medications: Current Outpatient Medications  Medication Sig Dispense Refill  . ACCU-CHEK GUIDE test strip TEST UP TO 4 TIMES A DAY 100 each 0  . Accu-Chek Softclix Lancets lancets TEST 4 TIMES A DAY 100 each 0  . albuterol (VENTOLIN HFA) 108 (90 Base) MCG/ACT inhaler Inhale 2 puffs into the lungs every 4 (four) hours as needed for wheezing or shortness of breath. 18 g 2  . atenolol (TENORMIN)  25 MG tablet Take 1.5 tablets (37.5 mg total) by mouth daily. 90 tablet 3  . atorvastatin (LIPITOR) 10 MG tablet Take 1 tablet (10 mg total) by mouth at bedtime. 90 tablet 3  . blood glucose meter kit and supplies Dispense based on patient and insurance preference. Use up to four times daily as directed. (FOR ICD-10 E10.9, E11.9). 1 each 0  . clindamycin (CLEOCIN T) 1  % external solution Apply to aa's scalp QD PRN 60 mL 3  . clotrimazole (LOTRIMIN) 1 % cream Apply 1 application topically 2 (two) times daily. 30 g 0  . cyclobenzaprine (FLEXERIL) 5 MG tablet Take 5 mg by mouth 2 (two) times daily as needed for muscle spasms.     . DULoxetine (CYMBALTA) 60 MG capsule Take 1 capsule (60 mg total) by mouth 2 (two) times daily. 180 capsule 3  . Fluticasone-Umeclidin-Vilant (TRELEGY ELLIPTA) 100-62.5-25 MCG/INH AEPB Inhale 1 puff into the lungs daily. 28 each 11  . gabapentin (NEURONTIN) 300 MG capsule Take 600 mg by mouth 2 (two) times daily.     . hydrochlorothiazide (HYDRODIURIL) 25 MG tablet TAKE 1 TABLET (25 MG TOTAL) BY MOUTH DAILY. 90 tablet 0  . HYDROcodone-acetaminophen (NORCO/VICODIN) 5-325 MG tablet 1 po qday prn.    . hydrOXYzine (ATARAX/VISTARIL) 50 MG tablet Take 1 tablet (50 mg total) by mouth every 8 (eight) hours as needed (may cause drowsiness.). 90 tablet 0  . ketoconazole (NIZORAL) 2 % shampoo Shampoo into the scalp let sit 10 minutes then wash out. Use 3d/wk. 120 mL 2  . losartan (COZAAR) 100 MG tablet Take 1 tablet (100 mg total) by mouth daily. 90 tablet 3  . Lurasidone HCl (LATUDA) 60 MG TABS Take 1 tablet (60 mg total) by mouth daily with supper. 30 tablet 1  . meloxicam (MOBIC) 15 MG tablet Take by mouth.    Marland Kitchen omeprazole (PRILOSEC) 40 MG capsule Take 1 capsule (40 mg total) by mouth daily. 90 capsule 3  . predniSONE (DELTASONE) 10 MG tablet Take as directed - 12 day taper    . Semaglutide,0.25 or 0.5MG/DOS, (OZEMPIC, 0.25 OR 0.5 MG/DOSE,) 2 MG/1.5ML SOPN Inject 0.25 mg into the skin once a week for 24 doses. 1.5 mL 2  . umeclidinium-vilanterol (ANORO ELLIPTA) 62.5-25 MCG/INH AEPB Inhale 1 puff into the lungs daily. 60 each 5  . vitamin B-12 (CYANOCOBALAMIN) 1000 MCG tablet Take 1,000 mcg by mouth daily.     . Vitamin D, Ergocalciferol, (DRISDOL) 1.25 MG (50000 UNIT) CAPS capsule TAKE 1 CAPSULE BY MOUTH EVERY SEVEN DAYS. RECHECK LAB 1-2 WEEKS  AFTER COMPLETION 12 capsule 0   No current facility-administered medications for this visit.     Musculoskeletal: Strength & Muscle Tone: UTA Gait & Station: UTA Patient leans: N/A  Psychiatric Specialty Exam: Review of Systems  Musculoskeletal: Positive for back pain.  Psychiatric/Behavioral:       Grieving  All other systems reviewed and are negative.   There were no vitals taken for this visit.There is no height or weight on file to calculate BMI.  General Appearance: Casual  Eye Contact:  Fair  Speech:  Clear and Coherent  Volume:  Normal  Mood:  Grieving  Affect:  Congruent  Thought Process:  Goal Directed and Descriptions of Associations: Intact  Orientation:  Full (Time, Place, and Person)  Thought Content: Logical   Suicidal Thoughts:  No  Homicidal Thoughts:  No  Memory:  Immediate;   Fair Recent;   Fair Remote;   Fair  Judgement:  Fair  Insight:  Fair  Psychomotor Activity:  Normal  Concentration:  Concentration: Fair and Attention Span: Fair  Recall:  [employer] of Knowledge: Fair  Language: Fair  Akathisia:  No  Handed:  Right  AIMS (if indicated): UTA  Assets:  Communication Skills Desire for Improvement Housing Social Support Talents/Skills  ADL's:  Intact  Cognition: WNL  Sleep:  Fair   Screenings: GAD-7   Flowsheet Row Office Visit from 11/06/2019 in [facility] Office Visit from 09/06/2018 in [facility]  Total GAD-7 Score 21 14    PHQ2-9   Flowsheet Row Video Visit from 07/03/2020 in [facility] Office Visit from 05/28/2020 in [facility] from 05/27/2020 in [facility] Visit from 11/06/2019 in [facility] Office Visit from 10/16/2019 in [facility]  PHQ-2 Total Score _0 PHQ-9 Total Score -- 6 -- 13 23    Flowsheet Row Video Visit from 07/03/2020 in [place] Error: Q6 is Yes, you must answer 7       Assessment and Plan: Susan [name] is a 60 year old Caucasian female who has a history of bipolar disorder, degenerative disc disease, multiple medical problems was evaluated by telemedicine today.  Patient is biologically predisposed given her family history of mental health problems, history of substance abuse problems and trauma.  Patient is currently making progress.  Plan as noted below.  Plan Bipolar disorder in remission Latuda 60 mg p.o. daily with supper Cymbalta as prescribed  PTSD-stable Cymbalta 60 mg p.o. daily Hydroxyzine 25 mg p.o. 3 times daily as needed for breakthrough anxiety Continue CBT with therapist-Mr. Mostafa  Bereavement-improving Provided counseling.  Tobacco use disorder-improving Provided counseling.  Patient is currently using a light cigarettes and is cutting back.  Alcohol use disorder in remission Continue AA meetings  Follow-up in clinic in 4 weeks or sooner if needed.  This note was generated in part or whole with voice recognition software. Voice recognition is usually quite accurate but there are transcription errors that can and very often do occur. I apologize for any typographical errors that were not detected and corrected.      Ursula Alert, MD 07/15/2020, 4:36 PM

## 2020-07-31 ENCOUNTER — Encounter: Payer: Self-pay | Admitting: *Deleted

## 2020-07-31 NOTE — Progress Notes (Signed)
Noted patient did not go for lung scan.

## 2020-08-03 ENCOUNTER — Other Ambulatory Visit: Payer: Self-pay | Admitting: Adult Health

## 2020-08-06 ENCOUNTER — Encounter: Payer: Self-pay | Admitting: Oncology

## 2020-08-10 ENCOUNTER — Other Ambulatory Visit: Payer: Self-pay | Admitting: Family Medicine

## 2020-08-10 ENCOUNTER — Other Ambulatory Visit: Payer: Self-pay | Admitting: Adult Health

## 2020-08-10 DIAGNOSIS — I10 Essential (primary) hypertension: Secondary | ICD-10-CM

## 2020-08-10 DIAGNOSIS — E785 Hyperlipidemia, unspecified: Secondary | ICD-10-CM

## 2020-08-10 DIAGNOSIS — I700 Atherosclerosis of aorta: Secondary | ICD-10-CM

## 2020-08-10 NOTE — Telephone Encounter (Signed)
Notes to clinic:  Patient has a upcoming appointment on 08/26/2020 Review for refills until that time    Requested Prescriptions  Pending Prescriptions Disp Refills   losartan (COZAAR) 100 MG tablet [Pharmacy Med Name: Losartan Potassium 100MG  TABS] 90 tablet 3    Sig: Take 1 tablet (100 mg total) by mouth daily.      Cardiovascular:  Angiotensin Receptor Blockers Failed - 08/10/2020 10:49 AM      Failed - Cr in normal range and within 180 days    Creat  Date Value Ref Range Status  10/16/2019 0.92 0.50 - 1.05 mg/dL Final    Comment:    For patients >66 years of age, the reference limit for Creatinine is approximately 13% higher for people identified as African-American. .    Creatinine, Ser  Date Value Ref Range Status  05/28/2020 1.10 (H) 0.57 - 1.00 mg/dL Final   Creatinine, Urine  Date Value Ref Range Status  10/16/2019 213 20 - 275 mg/dL Final          Failed - Last BP in normal range    BP Readings from Last 1 Encounters:  05/28/20 (!) 158/81          Passed - K in normal range and within 180 days    Potassium  Date Value Ref Range Status  05/28/2020 3.8 3.5 - 5.2 mmol/L Final  04/29/2014 4.2 3.5 - 5.1 mmol/L Final          Passed - Patient is not pregnant      Passed - Valid encounter within last 6 months    Recent Outpatient Visits           2 months ago Type 2 diabetes mellitus without complication, without long-term current use of insulin ([place])   [place], Kelby Aline, FNP   4 months ago Weight gain   [facility], Kelby Aline, FNP   6 months ago Essential hypertension   [facility], Kelby Aline, FNP   7 months ago Essential hypertension   [facility], Adriana M, PA-C   8 months ago Screening for blood or protein in urine   [place], Kelby Aline, FNP       Future Appointments             In 2 weeks [place], Kelby Aline, [place], [facility]               atenolol (TENORMIN) 25 MG tablet [Pharmacy Med Name: Atenolol 25MG  TABS] 90 tablet 3    Sig: TAKE 1 TABLET (25 MG TOTAL) BY MOUTH DAILY.      Cardiovascular:  Beta Blockers Failed - 08/10/2020 10:49 AM      Failed - Last BP in normal range    BP Readings from Last 1 Encounters:  05/28/20 (!) 158/81          Passed - Last Heart Rate in normal range    Pulse Readings from Last 1 Encounters:  05/28/20 84          Passed - Valid encounter within last 6 months    Recent Outpatient Visits           2 months ago Type 2 diabetes mellitus without complication, without long-term current use of insulin ([place])   [place], Kelby Aline, FNP   4 months ago Weight gain   [facility], Kelby Aline, FNP  6 months ago Essential hypertension   [place], Kelby Aline, FNP   7 months ago Essential hypertension   [place], [place], PA-C   8 months ago Screening for blood or protein in urine   [place], Kelby Aline, FNP       Future Appointments             In 2 weeks [place], Kelby Aline, [place], [facility]               atorvastatin (LIPITOR) 10 MG tablet [Pharmacy Med Name: Atorvastatin Calcium 10MG  TABS] 90 tablet 3    Sig: Take 1 tablet (10 mg total) by mouth at bedtime.      Cardiovascular:  Antilipid - Statins Failed - 08/10/2020 10:49 AM      Failed - HDL in normal range and within 360 days    HDL  Date Value Ref Range Status  08/21/2019 47 (L) > OR = 50 mg/dL Final  07/26/2018 44 >39 mg/dL Final          Passed - Total Cholesterol in normal range and within 360 days    Cholesterol, Total  Date Value Ref Range Status  07/26/2018 158 100 - 199 mg/dL Final   Cholesterol  Date Value Ref Range Status  08/21/2019 143 <200 mg/dL Final          Passed - LDL in normal range and  within 360 days    LDL Cholesterol (Calc)  Date Value Ref Range Status  08/21/2019 79 mg/dL (calc) Final    Comment:    Reference range: <100 . Desirable range <100 mg/dL for primary prevention;   <70 mg/dL for patients with CHD or diabetic patients  with > or = 2 CHD risk factors. Marland Kitchen LDL-C is now calculated using the Martin-Hopkins  calculation, which is a validated novel method providing  better accuracy than the Friedewald equation in the  estimation of LDL-C.  Cresenciano Genre et al. Annamaria Helling. 3474;259(56): 2061-2068  (http://education.[employer].com/faq/FAQ164)           Passed - Triglycerides in normal range and within 360 days    Triglycerides  Date Value Ref Range Status  08/21/2019 83 <150 mg/dL Final          Passed - Patient is not pregnant      Passed - Valid encounter within last 12 months    Recent Outpatient Visits           2 months ago Type 2 diabetes mellitus without complication, without long-term current use of insulin ([place])   [place], Kelby Aline, FNP   4 months ago Weight gain   [facility], Kelby Aline, FNP   6 months ago Essential hypertension   [facility], Kelby Aline, FNP   7 months ago Essential hypertension   [facility], Adriana M, PA-C   8 months ago Screening for blood or protein in urine   [place], Kelby Aline, FNP       Future Appointments             In 2 weeks [place], Kelby Aline, [place], [place]

## 2020-08-11 ENCOUNTER — Encounter: Payer: Self-pay | Admitting: Psychiatry

## 2020-08-11 ENCOUNTER — Ambulatory Visit (INDEPENDENT_AMBULATORY_CARE_PROVIDER_SITE_OTHER): Payer: Medicare Other | Admitting: Psychology

## 2020-08-11 ENCOUNTER — Other Ambulatory Visit: Payer: Self-pay

## 2020-08-11 ENCOUNTER — Telehealth (INDEPENDENT_AMBULATORY_CARE_PROVIDER_SITE_OTHER): Payer: Medicare Other | Admitting: Psychiatry

## 2020-08-11 DIAGNOSIS — F4310 Post-traumatic stress disorder, unspecified: Secondary | ICD-10-CM | POA: Diagnosis not present

## 2020-08-11 DIAGNOSIS — F3289 Other specified depressive episodes: Secondary | ICD-10-CM

## 2020-08-11 DIAGNOSIS — F3131 Bipolar disorder, current episode depressed, mild: Secondary | ICD-10-CM

## 2020-08-11 DIAGNOSIS — F17200 Nicotine dependence, unspecified, uncomplicated: Secondary | ICD-10-CM | POA: Diagnosis not present

## 2020-08-11 DIAGNOSIS — F1021 Alcohol dependence, in remission: Secondary | ICD-10-CM

## 2020-08-11 DIAGNOSIS — Z634 Disappearance and death of family member: Secondary | ICD-10-CM

## 2020-08-11 DIAGNOSIS — F1421 Cocaine dependence, in remission: Secondary | ICD-10-CM

## 2020-08-11 MED ORDER — LURASIDONE HCL 80 MG PO TABS: 80.0000 mg | ORAL_TABLET | Freq: Every day | ORAL | 0 refills | Status: DC

## 2020-08-11 NOTE — Progress Notes (Signed)
Virtual Visit via Video Note  I connected with [name] on 08/11/20 at  4:40 PM EDT by a video enabled telemedicine application and verified that I am speaking with the correct person using two identifiers.  Location Provider Location : [facility] Patient Location : Home  Participants: Patient , Provider   I discussed the limitations of evaluation and management by telemedicine and the availability of in person appointments. The patient expressed understanding and agreed to proceed.    I discussed the assessment and treatment plan with the patient. The patient was provided an opportunity to ask questions and all were answered. The patient agreed with the plan and demonstrated an understanding of the instructions.   The patient was advised to call back or seek an in-person evaluation if the symptoms worsen or if the condition fails to improve as anticipated.  Video connection was lost at less than 50% of the duration of the visit, at which time the remainder of the visit was completed through audio only  [facility] MD OP Progress Note  08/11/2020 5:10 PM OTIE HEADLEE  MRN:  [id number]  Chief Complaint:  Chief Complaint    Follow-up; Depression; Insomnia; Drug / Alcohol Assessment     HPI: Susan [name] is a 60 year old Caucasian female, widowed, disabled, lives in [place], has a history of bipolar disorder, alcohol and cocaine use disorder in remission, tobacco use disorder, PTSD, bereavement, degenerative disc disease, obstructive sleep apnea on CPAP, history of total hip replacement, osteoarthritis, hepatitis C was evaluated by telemedicine today.  Patient today reports since the past few weeks she has been feeling more depressed.  She reports she got through her son's death anniversary okay.  It was on March 13.  She however reports the week after that she started feeling depressed.  She has been isolating herself from people as much as she can.  She however has been going for her  [place] meetings.  In fact she also went for AA meeting today.  So far that is going well.  She however does feel sad, struggles with low motivation.  She also struggles with concentration problems.  She reports she does have psychosocial stressors of her daughter abusing drugs and not caring for her children.  She reports her father also seems to feel the same and they are trying to get her help.  This does worry her.  Patient reports sleep is overall okay.  Patient continues to stay away from alcohol and other drugs.  Patient denies any suicidality, homicidality or perceptual disturbances.  She did have psychotherapy sessions recently with her therapist Mr. Mostafa.  That is going well.  She agrees to continue therapy.  She is compliant on medications.  Denies side effects.  Patient denies any other concerns today.  Visit Diagnosis:    ICD-10-CM   1. Mild depressed bipolar 1 disorder (HCC)  F31.31 lurasidone (LATUDA) 80 MG TABS tablet  2. PTSD (post-traumatic stress disorder)  F43.10   3. Bereavement  Z63.4   4. Tobacco use disorder  F17.200   5. Alcohol use disorder, severe, in sustained remission ([place])  F10.21   6. Cocaine use disorder, moderate, in sustained remission ([place])  F14.21     Past Psychiatric History: I have reviewed past psychiatric history from my progress note on 09/27/2019.  Past trials of Seroquel, Cymbalta, Abilify, risperidone  Past Medical History:  Past Medical History:  Diagnosis Date  . ADD (attention deficit disorder)   . Alcohol abuse   .  Anemia   . Anxiety   . Aortic atherosclerosis ([place]) 02/20/2018   Chest CT Sept 2019  . Arthritis    rheumatoid arthritis  . Asthma   . Bipolar disorder ([place])   . Centrilobular emphysema ([place]) 02/20/2018   Chest CT Sept 2019  . Constipation   . Degenerative disc disease at L5-S1 level 09/28/2016   See ortho note May 2018  . Depression    bipolar, hx of suicide attempt  . Drug use   . Dyspnea    with exertion  .  GERD (gastroesophageal reflux disease)   . H/O suicide attempt    slit wrists  . Hepatitis C 06/26/2014  . Hip pain   . History of alcohol abuse   . History of diverticulitis 2013  . History of hepatitis C    HEP "C"--three years ago  . History of MRSA infection 2013  . HLD (hyperlipidemia)   . Hypertension   . Hypothyroidism   . Incisional hernia 11/08/2012  . Knee pain   . 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 Multinodular thyroid   . OSA (obstructive sleep apnea)    managed by Dr. Manuella Ghazi  . Osteoporosis   . Post-traumatic osteoarthritis of right knee 09/24/2015  . Prediabetes   . Recurrent ventral hernia 11/08/2012  . Sleep apnea     Dx 3 years ago. Use C-PAP  . 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 Thyroid disease    Goiter  . Vitamin B12 deficiency   . Vitamin D deficiency disease     Past Surgical History:  Procedure Laterality Date  . BILATERAL SALPINGOOPHORECTOMY     due to abnormal mass  . BREAST BIOPSY Left    neg  . BREAST SURGERY Left 20 yrs ago  . CESAREAN SECTION    . COLONOSCOPY    . COLONOSCOPY WITH PROPOFOL N/A 10/16/2017   Procedure: COLONOSCOPY WITH PROPOFOL;  Surgeon: Jonathon Bellows, MD;  Location[facility] ENDOSCOPY;  Service: Gastroenterology;  Laterality: N/A;  . HERNIA REPAIR  06/2011, July 2014   Ventral wall repair with Physiomesh  . HERNIA REPAIR     2nd.vental wall repair  . JOINT REPLACEMENT Right    knee  . TONSILLECTOMY    . TOTAL HIP ARTHROPLASTY Left 07/23/2019   Procedure: TOTAL HIP ARTHROPLASTY;  Surgeon: Corky Mull, MD;  Location: [facility] ORS;  Service: Orthopedics;  Laterality: Left;  . TOTAL KNEE ARTHROPLASTY Right 05/10/2016   Procedure: TOTAL KNEE ARTHROPLASTY;  Surgeon: Corky Mull, MD;  Location: [facility] ORS;  Service: Orthopedics;  Laterality: Right;  . TUBAL LIGATION      Family Psychiatric History: I have reviewed family psychiatric history from my progress note on 09/27/2019  Family History:  Family History  Problem  Relation Age of Onset  . Alcohol abuse Father   . Heart attack Father   . Arthritis Mother   . Asthma Mother   . Mental illness Mother   . Thyroid disease Mother   . COPD Mother   . Heart disease Mother   . Congestive Heart Failure Mother   . Alcohol abuse Mother   . Eating disorder Mother   . Bipolar disorder Mother   . Arthritis Brother   . Mental illness Brother   . Cancer Brother        non-hodkins lymphoma  . Alcohol abuse Sister   . Drug abuse Sister   . Mental illness Sister   . Mental illness Sister   .  Fibromyalgia Sister   . Obesity Sister   . Pneumonia Sister   . Depression Sister   . Mental illness Sister   . Alcohol abuse Sister   . Drug abuse Sister   . Drug abuse Son   . Alcohol abuse Son   . Drug abuse Son   . Alcohol abuse Son   . Drug abuse Daughter   . Diabetes Neg Hx   . Stroke Neg Hx   . Breast cancer Neg Hx     Social History: I have reviewed social history from my progress note on 09/27/2019 Social History   Socioeconomic History  . Marital status: Widowed    Spouse name: Not on file  . Number of children: 3  . Years of education: Not on file  . Highest education level: Some college, no degree  Occupational History  . Occupation: disabled  Tobacco Use  . Smoking status: Current Every Day Smoker    Packs/day: 1.00    Years: 41.00    Pack years: 41.00    Types: Cigarettes  . Smokeless tobacco: Never Used  . Tobacco comment: Pt to discuss trying the patch with PCP at [place] in office apt.   Vaping Use  . Vaping Use: Former  . Quit date: 07/15/2017  Substance and Sexual Activity  . Alcohol use: No    Alcohol/week: 0.0 standard drinks    Comment: sober since October 2018  . 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 Sexual activity: Not Currently  Other Topics Concern  . Not on file  Social History Narrative  . Not on file   Social Determinants of Health   Financial Resource Strain: Low Risk   .  Difficulty of Paying Living Expenses: Not hard at all  Food Insecurity: No Food Insecurity  . Worried About Charity fundraiser in the Last Year: Never true  . Ran Out of Food in the Last Year: Never true  Transportation Needs: No Transportation Needs  . Lack of Transportation (Medical): No  . Lack of Transportation (Non-Medical): No  Physical Activity: Inactive  . Days of Exercise per Week: 0 days  . Minutes of Exercise per Session: 0 min  Stress: No Stress Concern Present  . Feeling of Stress : Only a little  Social Connections: Moderately Isolated  . Frequency of Communication with Friends and Family: More than three times a week  . Frequency of Social Gatherings with Friends and Family: More than three times a week  . Attends Religious Services: Never  . Active Member of Clubs or Organizations: Yes  . Attends [employer] Meetings: More than 4 times per year  . Marital Status: Widowed    Allergies:  Allergies  Allergen Reactions  . Wellbutrin [Bupropion]     Patient reports made " deathly sick " years ago at appointment 03/18/20.  . Lasix [Furosemide] Other (See Comments)    Electrolyte imbalance    Metabolic Disorder Labs: Lab Results  Component Value Date   HGBA1C 6.1 (H) 05/28/2020   MPG 108 04/08/2019   MPG 103 08/16/2017   No results found for: PROLACTIN Lab Results  Component Value Date   CHOL 143 08/21/2019   TRIG 83 08/21/2019   HDL 47 (L) 08/21/2019   CHOLHDL 3.0 08/21/2019   VLDL 27 10/14/2016   LDLCALC 79 08/21/2019   LDLCALC 79 04/08/2019   Lab Results  Component Value Date   TSH 1.09 10/16/2019  TSH 1.430 07/26/2018    Therapeutic Level Labs: No results found for: LITHIUM No results found for: VALPROATE No components found for:  CBMZ  Current Medications: Current Outpatient Medications  Medication Sig Dispense Refill  . lurasidone (LATUDA) 80 MG TABS tablet Take 1 tablet (80 mg total) by mouth daily with supper. 90 tablet 0  .  ACCU-CHEK GUIDE test strip TEST UP TO 4 TIMES A DAY 100 each 0  . Accu-Chek Softclix Lancets lancets TEST 4 TIMES A DAY 100 each 0  . albuterol (VENTOLIN HFA) 108 (90 Base) MCG/ACT inhaler Inhale 2 puffs into the lungs every 4 (four) hours as needed for wheezing or shortness of breath. 18 g 2  . atenolol (TENORMIN) 25 MG tablet TAKE 1 TABLET (25 MG TOTAL) BY MOUTH DAILY. 90 tablet 3  . atorvastatin (LIPITOR) 10 MG tablet TAKE 1 TABLET (10 MG TOTAL) BY MOUTH AT BEDTIME. 90 tablet 3  . blood glucose meter kit and supplies Dispense based on patient and insurance preference. Use up to four times daily as directed. (FOR ICD-10 E10.9, E11.9). 1 each 0  . clindamycin (CLEOCIN T) 1 % external solution Apply to aa's scalp QD PRN 60 mL 3  . clotrimazole (LOTRIMIN) 1 % cream Apply 1 application topically 2 (two) times daily. 30 g 0  . cyclobenzaprine (FLEXERIL) 5 MG tablet Take 5 mg by mouth 2 (two) times daily as needed for muscle spasms.     . DULoxetine (CYMBALTA) 60 MG capsule Take 1 capsule (60 mg total) by mouth 2 (two) times daily. 180 capsule 3  . Fluticasone-Umeclidin-Vilant (TRELEGY ELLIPTA) 100-62.5-25 MCG/INH AEPB Inhale 1 puff into the lungs daily. 28 each 11  . gabapentin (NEURONTIN) 300 MG capsule Take 600 mg by mouth 2 (two) times daily.     . hydrochlorothiazide (HYDRODIURIL) 25 MG tablet TAKE 1 TABLET (25 MG TOTAL) BY MOUTH DAILY. 90 tablet 0  . HYDROcodone-acetaminophen (NORCO/VICODIN) 5-325 MG tablet 1 po qday prn.    . hydrOXYzine (ATARAX/VISTARIL) 50 MG tablet Take 1 tablet (50 mg total) by mouth every 8 (eight) hours as needed (may cause drowsiness.). 90 tablet 0  . ketoconazole (NIZORAL) 2 % shampoo Shampoo into the scalp let sit 10 minutes then wash out. Use 3d/wk. 120 mL 2  . losartan (COZAAR) 100 MG tablet TAKE 1 TABLET (100 MG TOTAL) BY MOUTH DAILY. 90 tablet 3  . meloxicam (MOBIC) 15 MG tablet Take by mouth.    Marland Kitchen omeprazole (PRILOSEC) 40 MG capsule Take 1 capsule (40 mg total) by  mouth daily. 90 capsule 3  . Semaglutide,0.25 or 0.5MG/DOS, (OZEMPIC, 0.25 OR 0.5 MG/DOSE,) 2 MG/1.5ML SOPN Inject 0.25 mg into the skin once a week for 24 doses. 1.5 mL 2  . umeclidinium-vilanterol (ANORO ELLIPTA) 62.5-25 MCG/INH AEPB Inhale 1 puff into the lungs daily. 60 each 5  . vitamin B-12 (CYANOCOBALAMIN) 1000 MCG tablet Take 1,000 mcg by mouth daily.     . Vitamin D, Ergocalciferol, (DRISDOL) 1.25 MG (50000 UNIT) CAPS capsule TAKE 1 CAPSULE BY MOUTH EVERY SEVEN DAYS. RECHECK LAB 1-2 WEEKS AFTER COMPLETION 12 capsule 0   No current facility-administered medications for this visit.     Musculoskeletal: Strength & Muscle Tone: UTA Gait & Station: UTA Patient leans: N/A  Psychiatric Specialty Exam: Review of Systems  Psychiatric/Behavioral: Positive for decreased concentration and dysphoric mood.  All other systems reviewed and are negative.   There were no vitals taken for this visit.There is no height or weight on file  to calculate BMI.  General Appearance: UTA  Eye Contact:  UTA  Speech:  Clear and Coherent  Volume:  Normal  Mood:  Depressed  Affect:  UTA  Thought Process:  Goal Directed and Descriptions of Associations: Intact  Orientation:  Full (Time, Place, and Person)  Thought Content: Logical   Suicidal Thoughts:  No  Homicidal Thoughts:  No  Memory:  Immediate;   Fair Recent;   Fair Remote;   Fair  Judgement:  Fair  Insight:  Fair  Psychomotor Activity:  UTA  Concentration:  Concentration: Fair and Attention Span: Fair  Recall:  [employer] of Knowledge: Fair  Language: Fair  Akathisia:  No  Handed:  Right  AIMS (if indicated): UTA  Assets:  Communication Skills Desire for Improvement Housing Social Support  ADL's:  Intact  Cognition: WNL  Sleep:  Fair   Screenings: GAD-7   Flowsheet Row Office Visit from 11/06/2019 in [facility] Office Visit from 09/06/2018 in [facility]  Total GAD-7 Score 21 14    PHQ2-9    Flowsheet Row Video Visit from 08/11/2020 in [facility] Video Visit from 07/03/2020 in [place] Office Visit from 05/28/2020 in [facility] from 05/27/2020 in [place] Visit from 11/06/2019 in [facility]  PHQ-2 Total Score _0 PHQ-9 Total Score 10 -- 6 -- 13    Flowsheet Row Video Visit from 08/11/2020 in [facility] Video Visit from 07/03/2020 in [place] Low Risk Error: Q6 is Yes, you must answer 7       Assessment and Plan: DOLCE SYLVIA is a 60 year old Caucasian female who has a history of bipolar disorder, degenerative disc disease, multiple medical problems was evaluated by telemedicine today.  Patient is biologically predisposed given her family history of mental health problems, history of substance abuse problems and trauma.  Patient is currently struggling with depressive symptoms, does have psychosocial stressors as noted above.  She will benefit from the following plan.  Plan Bipolar disorder type I depressed-unstable Increase Latuda to 80 mg p.o. daily with supper Cymbalta as prescribed  PTSD-stable Cymbalta 60 mg p.o. daily Continue CBT with therapist-Mr. Mostafa Hydroxyzine 25 mg p.o. 3 times daily as needed for breakthrough anxiety attacks  Bereavement-improving Provided counseling  Tobacco use disorder-improving Provided counseling.  She will follow-up with her primary care provider.  Alcohol use disorder in remission Continue AA meetings  Follow-up in clinic in 2 to 3 weeks or sooner if needed.   I have spent at least 20 minutes non face to face with patient today which includes the time spent for preparing to see the patient  (e.g., review of test, records), ordering medications and test, psychoeducation and supportive psychotherapy, care  coordination as well as documenting clinical information in electronic health record.   This note was generated in part or whole with voice recognition software. Voice recognition is usually quite accurate but there are transcription errors that can and very often do occur. I apologize for any typographical errors that were not detected and corrected.      Ursula Alert, MD 08/12/2020, 8:27 AM

## 2020-08-24 ENCOUNTER — Other Ambulatory Visit: Payer: Self-pay | Admitting: Adult Health

## 2020-08-24 ENCOUNTER — Other Ambulatory Visit: Payer: Self-pay | Admitting: Dermatology

## 2020-08-24 DIAGNOSIS — L739 Follicular disorder, unspecified: Secondary | ICD-10-CM

## 2020-08-24 DIAGNOSIS — E119 Type 2 diabetes mellitus without complications: Secondary | ICD-10-CM

## 2020-08-26 ENCOUNTER — Encounter: Payer: Self-pay | Admitting: Adult Health

## 2020-08-26 ENCOUNTER — Other Ambulatory Visit: Payer: Self-pay

## 2020-08-26 ENCOUNTER — Other Ambulatory Visit: Payer: Self-pay | Admitting: Adult Health

## 2020-08-26 ENCOUNTER — Ambulatory Visit (INDEPENDENT_AMBULATORY_CARE_PROVIDER_SITE_OTHER): Payer: Medicare Other | Admitting: Adult Health

## 2020-08-26 VITALS — BP 141/80 | HR 66 | Resp 16 | Wt 225.4 lb

## 2020-08-26 DIAGNOSIS — Z720 Tobacco use: Secondary | ICD-10-CM

## 2020-08-26 DIAGNOSIS — E119 Type 2 diabetes mellitus without complications: Secondary | ICD-10-CM

## 2020-08-26 LAB — POCT GLYCOSYLATED HEMOGLOBIN (HGB A1C): Hemoglobin A1C: 5.1 % (ref 4.0–5.6)

## 2020-08-26 MED ORDER — OZEMPIC (0.25 OR 0.5 MG/DOSE) 2 MG/1.5ML ~~LOC~~ SOPN: 0.5000 mg | PEN_INJECTOR | SUBCUTANEOUS | 2 refills | Status: AC

## 2020-08-26 MED ORDER — ACCU-CHEK SOFTCLIX LANCETS MISC: 1.0000 | Freq: Four times a day (QID) | 0 refills | Status: DC

## 2020-08-26 MED ORDER — NICOTINE 14 MG/24HR TD PT24: 14.0000 mg | MEDICATED_PATCH | Freq: Every day | TRANSDERMAL | 0 refills | Status: DC

## 2020-08-26 NOTE — Progress Notes (Signed)
Established patient visit   Patient: Susan [name]   DOB: [birth year]   60 y.o. Female  MRN: [id number] Visit Date: 08/26/2020  Today's healthcare provider: Marcille Buffy, FNP   Chief Complaint  Patient presents with  . Hypertension  . Diabetes  . Anxiety   Subjective    HPI  Hypertension, follow-up  BP Readings from Last 3 Encounters:  08/26/20 (!) 141/80  05/28/20 (!) 158/81  05/22/20 (!) 150/73   Wt Readings from Last 3 Encounters:  08/26/20 225 lb 6.4 oz (102.2 kg)  05/28/20 243 lb (110.2 kg)  05/22/20 239 lb (108.4 kg)     She was last seen for hypertension 3 months ago.  BP at that visit was 158/81. Management since that visit includes increased atenolol from 74m to 37.5. She is on HCTZ 247mpo qd.  She reports poor compliance with treatment. Patient states that she stayed on Atenolol 2551mShe is not having side effects.  She is following a Regular diet. She is not exercising. She does not smoke.  Use of agents associated with hypertension: none.   Outside blood pressures are not being checked. Symptoms: No chest pain No chest pressure  No palpitations No syncope  No dyspnea No orthopnea  No paroxysmal nocturnal dyspnea No lower extremity edema   Pertinent labs: Lab Results  Component Value Date   CHOL 143 08/21/2019   HDL 47 (L) 08/21/2019   LDLCALC 79 08/21/2019   TRIG 83 08/21/2019   CHOLHDL 3.0 08/21/2019   Lab Results  Component Value Date   NA 137 05/28/2020   K 3.8 05/28/2020   CREATININE 1.10 (H) 05/28/2020   GFRNONAA 55 (L) 05/28/2020   GFRAA 64 05/28/2020   GLUCOSE 111 (H) 05/28/2020     The 10-year ASCVD risk score (GoMikey Bussing Jr., et al., 2013) is: 16.3%   --------------------------------------------------------------------------------------------------- Diabetes Mellitus Type II, Follow-up  Lab Results  Component Value Date   HGBA1C 5.1 08/26/2020   HGBA1C 6.1 (H) 05/28/2020   HGBA1C 6.2 (H) 03/20/2020    Wt Readings from Last 3 Encounters:  08/26/20 225 lb 6.4 oz (102.2 kg)  05/28/20 243 lb (110.2 kg)  05/22/20 239 lb (108.4 kg)   Last seen for diabetes 3 months ago.  Management since then includes started patient on Semalutide 0.25. She reports excellent compliance with treatment. She is not having side effects.  Symptoms: No fatigue No foot ulcerations  Yes appetite changes Yes nausea  No paresthesia of the feet  Yes polydipsia  No polyuria No visual disturbances   No vomiting     Home blood sugar records: fasting range: 85-122  Episodes of hypoglycemia? No    Current insulin regiment: Semaglutide 0.25 into the skin once a week for 24 doses Most Recent Eye Exam: 08/2020 Current exercise: no regular exercise Current diet habits: in general, a "healthy" diet    Pertinent Labs: Lab Results  Component Value Date   CHOL 143 08/21/2019   HDL 47 (L) 08/21/2019   LDLCALC 79 08/21/2019   TRIG 83 08/21/2019   CHOLHDL 3.0 08/21/2019   Lab Results  Component Value Date   NA 137 05/28/2020   K 3.8 05/28/2020   CREATININE 1.10 (H) 05/28/2020   GFRNONAA 55 (L) 05/28/2020   GFRAA 64 05/28/2020   GLUCOSE 111 (H) 05/28/2020     --------------------------------------------------------------------------------------------------- Anxiety, Follow-up  She was last seen for anxiety 3 months ago. Changes made at last visit include none.  She reports good compliance with treatment. She reports good tolerance of treatment. She is not having side effects.   She feels her anxiety is mild and Improved since last visit.  Symptoms: No chest pain Yes difficulty concentrating  No dizziness Yes fatigue  No feelings of losing control No insomnia  Yes irritable No palpitations  No panic attacks No racing thoughts  No shortness of breath No sweating  No tremors/shakes    GAD-7 Results GAD-7 Generalized Anxiety Disorder Screening Tool 11/06/2019 09/06/2018  1. Feeling Nervous, Anxious,  or on Edge 3 2  2. Not Being Able to Stop or Control Worrying 3 1  3. Worrying Too Much About Different Things 3 2  4. Trouble Relaxing 3 3  5. Being So Restless it's Hard To Sit Still 3 3  6. Becoming Easily Annoyed or Irritable 3 2  7. Feeling Afraid As If Something Awful Might Happen 3 1  Total GAD-7 Score 21 14  Difficulty At Work, Home, or Getting  Along With Others? Extremely difficult Somewhat difficult    PHQ-9 Scores PHQ9 SCORE ONLY 05/28/2020 05/27/2020 11/06/2019  PHQ-9 Total Score _0 Some encounter information is confidential and restricted. Go to Review Flowsheets activity to see all data.    --------------------------------------------------------------------------------------------------- Patient Active Problem List   Diagnosis Date Noted  . Nervousness 05/28/2020  . Anxiety 05/28/2020  . Bipolar affective disorder in remission ([place]) 05/28/2020  . Cough 03/18/2020  . Bipolar disorder, in full remission, most recent episode mixed ([place]) 03/06/2020  . Need for immunization against influenza 01/27/2020  . Encounter for screening mammogram for malignant neoplasm of breast 01/27/2020  . Fatigue 01/27/2020  . Morbid obesity ([place]) 01/27/2020  . Osteoporosis 11/06/2019  . Bipolar 1 disorder, mixed, mild ([place]) 10/29/2019  . PTSD (post-traumatic stress disorder) 09/27/2019  . Bereavement 09/27/2019  . Cocaine use disorder, moderate, in sustained remission ([place]) 09/27/2019  . Status post total hip replacement, left 07/23/2019  . Primary osteoarthritis of left hip 06/14/2019  . Insulin resistance 02/18/2019  . Prediabetes 12/31/2018  . Depression 10/23/2018  . Class 3 severe obesity with serious comorbidity and body mass index (BMI) of 40.0 to 44.9 in adult ([place]) 10/23/2018  . Allergic rhinitis 08/23/2018  . Aortic atherosclerosis ([place]) 02/20/2018  . Centrilobular emphysema ([place]) 02/20/2018  . Dyslipidemia 03/28/2017  . Degenerative disc disease at L5-S1 level  09/28/2016  . Tobacco use disorder 04/21/2016  . GERD (gastroesophageal reflux disease) 12/17/2015  . Chronic back pain 10/09/2015  . Post-traumatic osteoarthritis of right knee 09/24/2015  . Alcohol use disorder, severe, in sustained remission ([place]) 04/13/2015  . Vitamin D deficiency 04/09/2015  . Vitamin B12 deficiency 04/09/2015  . OSA on CPAP 04/09/2015  . Subclinical hyperthyroidism 06/26/2014  . Toxic multinodular goiter 06/26/2014  . Essential hypertension 06/26/2014  . Tobacco use disorder, continuous 06/26/2014  . Bipolar I disorder ([place]) 06/26/2014  . Lumbar radiculitis 10/03/2013  . Diverticulosis 08/24/2013   Past Medical History:  Diagnosis Date  . ADD (attention deficit disorder)   . Alcohol abuse   . Anemia   . Anxiety   . Aortic atherosclerosis ([place]) 02/20/2018   Chest CT Sept 2019  . Arthritis    rheumatoid arthritis  . Asthma   . Bipolar disorder ([place])   . Centrilobular emphysema ([place]) 02/20/2018   Chest CT Sept 2019  . Constipation   . Degenerative disc disease at L5-S1 level 09/28/2016   See ortho note May 2018  . Depression  bipolar, hx of suicide attempt  . Drug use   . Dyspnea    with exertion  . GERD (gastroesophageal reflux disease)   . H/O suicide attempt    slit wrists  . Hepatitis C 06/26/2014  . Hip pain   . History of alcohol abuse   . History of diverticulitis 2013  . History of hepatitis C    HEP "C"--three years ago  . History of MRSA infection 2013  . HLD (hyperlipidemia)   . Hypertension   . Hypothyroidism   . Incisional hernia 11/08/2012  . Knee pain   . 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 Multinodular thyroid   . OSA (obstructive sleep apnea)    managed by Dr. Manuella Ghazi  . Osteoporosis   . Post-traumatic osteoarthritis of right knee 09/24/2015  . Prediabetes   . Recurrent ventral hernia 11/08/2012  . Sleep apnea     Dx 3 years ago. Use C-PAP  . 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 Thyroid disease    Goiter   . Vitamin B12 deficiency   . Vitamin D deficiency disease    Allergies  Allergen Reactions  . Wellbutrin [Bupropion]     Patient reports made " deathly sick " years ago at appointment 03/18/20.  . Lasix [Furosemide] Other (See Comments)    Electrolyte imbalance       Medications: Outpatient Medications Prior to Visit  Medication Sig  . ACCU-CHEK GUIDE test strip TEST UP TO 4 TIMES A DAY  . albuterol (VENTOLIN HFA) 108 (90 Base) MCG/ACT inhaler Inhale 2 puffs into the lungs every 4 (four) hours as needed for wheezing or shortness of breath.  Marland Kitchen atenolol (TENORMIN) 25 MG tablet TAKE 1 TABLET (25 MG TOTAL) BY MOUTH DAILY.  Marland Kitchen atorvastatin (LIPITOR) 10 MG tablet TAKE 1 TABLET (10 MG TOTAL) BY MOUTH AT BEDTIME.  . BD PEN NEEDLE NANO U/F 32G X 4 MM MISC USE WITH OZEMPIC  . blood glucose meter kit and supplies Dispense based on patient and insurance preference. Use up to four times daily as directed. (FOR ICD-10 E10.9, E11.9).  . clindamycin (CLEOCIN T) 1 % external solution Apply to aa's scalp QD PRN  . clotrimazole (LOTRIMIN) 1 % cream Apply 1 application topically 2 (two) times daily.  . cyclobenzaprine (FLEXERIL) 5 MG tablet Take 5 mg by mouth 2 (two) times daily as needed for muscle spasms.   . DULoxetine (CYMBALTA) 60 MG capsule Take 1 capsule (60 mg total) by mouth 2 (two) times daily.  . Fluticasone-Umeclidin-Vilant (TRELEGY ELLIPTA) 100-62.5-25 MCG/INH AEPB Inhale 1 puff into the lungs daily.  Marland Kitchen gabapentin (NEURONTIN) 300 MG capsule Take 600 mg by mouth 2 (two) times daily.   . hydrochlorothiazide (HYDRODIURIL) 25 MG tablet TAKE 1 TABLET (25 MG TOTAL) BY MOUTH DAILY.  Marland Kitchen HYDROcodone-acetaminophen (NORCO/VICODIN) 5-325 MG tablet 1 po qday prn.  . hydrOXYzine (ATARAX/VISTARIL) 50 MG tablet Take 1 tablet (50 mg total) by mouth every 8 (eight) hours as needed (may cause drowsiness.).  Marland Kitchen ketoconazole (NIZORAL) 2 % shampoo SHAMPOO INTO THE SCALP AND LET SIT'10 MINUTES THEN WASH OUT, USE  3D/WK  . losartan (COZAAR) 100 MG tablet TAKE 1 TABLET (100 MG TOTAL) BY MOUTH DAILY.  Marland Kitchen lurasidone (LATUDA) 80 MG TABS tablet Take 1 tablet (80 mg total) by mouth daily with supper.  . meloxicam (MOBIC) 15 MG tablet Take by mouth.  Marland Kitchen omeprazole (PRILOSEC) 40 MG capsule Take 1 capsule (40 mg total) by mouth daily.  Marland Kitchen  umeclidinium-vilanterol (ANORO ELLIPTA) 62.5-25 MCG/INH AEPB Inhale 1 puff into the lungs daily.  . vitamin B-12 (CYANOCOBALAMIN) 1000 MCG tablet Take 1,000 mcg by mouth daily.   . Vitamin D, Ergocalciferol, (DRISDOL) 1.25 MG (50000 UNIT) CAPS capsule TAKE 1 CAPSULE BY MOUTH EVERY SEVEN DAYS. RECHECK LAB 1-2 WEEKS AFTER COMPLETION  . [DISCONTINUED] Accu-Chek Softclix Lancets lancets TEST 4 TIMES A DAY  . [DISCONTINUED] OZEMPIC, 0.25 OR 0.5 MG/DOSE, 2 MG/1.5ML SOPN INJECT 0.25 MG INTO THE SKIN ONCE A WEEK FOR 24 DOSES.   No facility-administered medications prior to visit.    Review of Systems  Constitutional: Negative.   HENT: Negative.   Respiratory: Negative.   Cardiovascular: Negative.   Gastrointestinal: Negative.   Genitourinary: Negative.   Musculoskeletal: Negative.   Neurological: Negative.   Psychiatric/Behavioral: Negative.        Objective    BP (!) 141/80   Pulse 66   Resp 16   Wt 225 lb 6.4 oz (102.2 kg)   SpO2 100%   BMI 42.59 kg/m     Physical Exam Vitals reviewed.  Constitutional:      General: She is not in acute distress.    Appearance: She is well-developed. She is not diaphoretic.     Interventions: She is not intubated. HENT:     Head: Normocephalic and atraumatic.     Right Ear: External ear normal.     Left Ear: External ear normal.     Nose: Nose normal.     Mouth/Throat:     Pharynx: No oropharyngeal exudate.  Eyes:     General: Lids are normal. No scleral icterus.       Right eye: No discharge.        Left eye: No discharge.     Conjunctiva/sclera: Conjunctivae normal.     Right eye: Right conjunctiva is not injected. No  exudate or hemorrhage.    Left eye: Left conjunctiva is not injected. No exudate or hemorrhage.    Pupils: Pupils are equal, round, and reactive to light.  Neck:     Thyroid: No thyroid mass or thyromegaly.     Vascular: Normal carotid pulses. No carotid bruit, hepatojugular reflux or JVD.     Trachea: Trachea and phonation normal. No tracheal tenderness or tracheal deviation.     Meningeal: Brudzinski's sign and Kernig's sign absent.  Cardiovascular:     Rate and Rhythm: Normal rate and regular rhythm.     Pulses: Normal pulses.          Radial pulses are 2+ on the right side and 2+ on the left side.       Dorsalis pedis pulses are 2+ on the right side and 2+ on the left side.       Posterior tibial pulses are 2+ on the right side and 2+ on the left side.     Heart sounds: Normal heart sounds, S1 normal and S2 normal. Heart sounds not distant. No murmur heard. No friction rub. No gallop.   Pulmonary:     Effort: Pulmonary effort is normal. No tachypnea, bradypnea, accessory muscle usage or respiratory distress. She is not intubated.     Breath sounds: Normal breath sounds. No stridor. No wheezing or rales.  Chest:     Chest wall: No tenderness.  Breasts:     Right: No supraclavicular adenopathy.     Left: No supraclavicular adenopathy.    Abdominal:     General: Bowel sounds are normal. There is no distension  or abdominal bruit.     Palpations: Abdomen is soft. There is no shifting dullness, fluid wave, hepatomegaly, splenomegaly, mass or pulsatile mass.     Tenderness: There is no abdominal tenderness. There is no guarding or rebound.     Hernia: No hernia is present.  Musculoskeletal:        General: No tenderness or deformity. Normal range of motion.     Cervical back: Full passive range of motion without pain, normal range of motion and neck supple. No edema, erythema or rigidity. No spinous process tenderness or muscular tenderness. Normal range of motion.  Lymphadenopathy:      Head:     Right side of head: No submental, submandibular, tonsillar, preauricular, posterior auricular or occipital adenopathy.     Left side of head: No submental, submandibular, tonsillar, preauricular, posterior auricular or occipital adenopathy.     Cervical: No cervical adenopathy.     Right cervical: No superficial, deep or posterior cervical adenopathy.    Left cervical: No superficial, deep or posterior cervical adenopathy.     Upper Body:     Right upper body: No supraclavicular or pectoral adenopathy.     Left upper body: No supraclavicular or pectoral adenopathy.  Skin:    General: Skin is warm and dry.     Coloration: Skin is not pale.     Findings: No abrasion, bruising, burn, ecchymosis, erythema, lesion, petechiae or rash.     Nails: There is no clubbing.  Neurological:     Mental Status: She is alert and oriented to person, place, and time.     GCS: GCS eye subscore is 4. GCS verbal subscore is 5. GCS motor subscore is 6.     Cranial Nerves: No cranial nerve deficit.     Sensory: No sensory deficit.     Motor: No tremor, atrophy, abnormal muscle tone or seizure activity.     Coordination: Coordination normal.     Gait: Gait normal.     Deep Tendon Reflexes: Reflexes are normal and symmetric. Reflexes normal. Babinski sign absent on the right side. Babinski sign absent on the left side.     Reflex Scores:      Tricep reflexes are 2+ on the right side and 2+ on the left side.      Bicep reflexes are 2+ on the right side and 2+ on the left side.      Brachioradialis reflexes are 2+ on the right side and 2+ on the left side.      Patellar reflexes are 2+ on the right side and 2+ on the left side.      Achilles reflexes are 2+ on the right side and 2+ on the left side. Psychiatric:        Speech: Speech normal.        Behavior: Behavior normal.        Thought Content: Thought content normal.        Judgment: Judgment normal.      Results for orders placed or  performed in visit on 08/26/20  POCT glycosylated hemoglobin (Hb A1C)  Result Value Ref Range   Hemoglobin A1C 5.1 4.0 - 5.6 %   HbA1c POC (<> result, manual entry)     HbA1c, POC (prediabetic range)     HbA1c, POC (controlled diabetic range)      Assessment & Plan     Type 2 diabetes mellitus without complication, without long-term current use of insulin ([place]) - Plan: POCT glycosylated  hemoglobin (Hb A1C), Semaglutide,0.25 or 0.5MG/DOS, (OZEMPIC, 0.25 OR 0.5 MG/DOSE,) 2 MG/1.5ML SOPN, Accu-Chek Softclix Lancets lancets  Nicotine abuse - Plan: nicotine (NICODERM CQ - DOSED IN MG/24 HOURS) 14 mg/24hr patch   Meds ordered this encounter  Medications  . Semaglutide,0.25 or 0.5MG/DOS, (OZEMPIC, 0.25 OR 0.5 MG/DOSE,) 2 MG/1.5ML SOPN    Sig: Inject 0.5 mg into the skin once a week for 12 doses.    Dispense:  1.5 mL    Refill:  2    Maximum Refills Reached  . Accu-Chek Softclix Lancets lancets    Sig: 1 each by Other route 4 (four) times daily. for testing    Dispense:  200 each    Refill:  0    Patient reports she needs longer lancets, as not piercing her fingers.  . nicotine (NICODERM CQ - DOSED IN MG/24 HOURS) 14 mg/24hr patch    Sig: Place 1 patch (14 mg total) onto the skin daily.    Dispense:  28 patch    Refill:  0    She would like to go up to Ozempic 0.5 as she has felt health benefits A 1 C is within normal now and she incidentally has benefited from side effect of weight loss.  She will keep snacks handy  Return in about 3 months (around 11/25/2020), or if symptoms worsen or fail to improve, for at any time for any worsening symptoms, Go to Emergency room/ urgent care if worse.     The entirety of the information documented in the History of Present Illness, Review of Systems and Physical Exam were personally obtained by me. Portions of this information were initially documented by the CMA and reviewed by me for thoroughness and accuracy.   Red Flags discussed. The patient  was given clear instructions to go to ER or return to medical center if any red flags develop, symptoms do not improve, worsen or new problems develop. They verbalized understanding.    Marcille Buffy, [place] [phone] (phone) [phone] (fax)  [place]

## 2020-08-26 NOTE — Patient Instructions (Addendum)
Mediterranean Diet A Mediterranean diet refers to food and lifestyle choices that are based on the traditions of countries located on the [place]. This way of eating has been shown to help prevent certain conditions and improve outcomes for people who have chronic diseases, like kidney disease and heart disease. What are tips for following this plan? Lifestyle  Cook and eat meals together with your family, when possible.  Drink enough fluid to keep your urine clear or pale yellow.  Be physically active every day. This includes: ? Aerobic exercise like running or swimming. ? Leisure activities like gardening, walking, or housework.  Get 7-8 hours of sleep each night.  If recommended by your health care provider, drink red wine in moderation. This means 1 glass a day for nonpregnant women and 2 glasses a day for men. A glass of wine equals 5 oz (150 mL). Reading food labels  Check the serving size of packaged foods. For foods such as rice and pasta, the serving size refers to the amount of cooked product, not dry.  Check the total fat in packaged foods. Avoid foods that have saturated fat or trans fats.  Check the ingredients list for added sugars, such as corn syrup.   Shopping  At the grocery store, buy most of your food from the areas near the walls of the store. This includes: ? Fresh fruits and vegetables (produce). ? Grains, beans, nuts, and seeds. Some of these may be available in unpackaged forms or large amounts (in bulk). ? Fresh seafood. ? Poultry and eggs. ? Low-fat dairy products.  Buy whole ingredients instead of prepackaged foods.  Buy fresh fruits and vegetables in-season from local farmers markets.  Buy frozen fruits and vegetables in resealable bags.  If you do not have access to quality fresh seafood, buy precooked frozen shrimp or canned fish, such as tuna, salmon, or sardines.  Buy small amounts of raw or cooked vegetables, salads, or olives from  the deli or salad bar at your store.  Stock your pantry so you always have certain foods on hand, such as olive oil, canned tuna, canned tomatoes, rice, pasta, and beans. Cooking  Cook foods with extra-virgin olive oil instead of using butter or other vegetable oils.  Have meat as a side dish, and have vegetables or grains as your main dish. This means having meat in small portions or adding small amounts of meat to foods like pasta or stew.  Use beans or vegetables instead of meat in common dishes like chili or lasagna.  Experiment with different cooking methods. Try roasting or broiling vegetables instead of steaming or sauteing them.  Add frozen vegetables to soups, stews, pasta, or rice.  Add nuts or seeds for added healthy fat at each meal. You can add these to yogurt, salads, or vegetable dishes.  Marinate fish or vegetables using olive oil, lemon juice, garlic, and fresh herbs. Meal planning  Plan to eat 1 vegetarian meal one day each week. Try to work up to 2 vegetarian meals, if possible.  Eat seafood 2 or more times a week.  Have healthy snacks readily available, such as: ? Vegetable sticks with hummus. ? Greek yogurt. ? Fruit and nut trail mix.  Eat balanced meals throughout the week. This includes: ? Fruit: 2-3 servings a day ? Vegetables: 4-5 servings a day ? Low-fat dairy: 2 servings a day ? Fish, poultry, or lean meat: 1 serving a day ? Beans and legumes: 2 or more servings a week ?   Nuts and seeds: 1-2 servings a day ? Whole grains: 6-8 servings a day ? Extra-virgin olive oil: 3-4 servings a day  Limit red meat and sweets to only a few servings a month   What are my food choices?  Mediterranean diet ? Recommended  Grains: Whole-grain pasta. Brown rice. Bulgar wheat. Polenta. Couscous. Whole-wheat bread. [place].  Vegetables: Artichokes. Beets. Broccoli. Cabbage. Carrots. Eggplant. Green beans. Chard. Kale. Spinach. Onions. Leeks. Peas. Squash.  Tomatoes. Peppers. Radishes.  Fruits: Apples. Apricots. Avocado. Berries. Bananas. Cherries. Dates. Figs. Grapes. Lemons. Melon. Oranges. Peaches. Plums. Pomegranate.  Meats and other protein foods: Beans. Almonds. Sunflower seeds. Pine nuts. Peanuts. [place]. Salmon. Scallops. Shrimp. [place]. Tilapia. Clams. Oysters. Eggs.  Dairy: Low-fat milk. Cheese. Greek yogurt.  Beverages: Water. Red wine. Herbal tea.  Fats and oils: Extra virgin olive oil. Avocado oil. Grape seed oil.  Sweets and desserts: [place] yogurt with honey. Baked apples. Poached pears. Trail mix.  Seasoning and other foods: Basil. Cilantro. Coriander. Cumin. Mint. Parsley. Sage. Rosemary. Tarragon. Garlic. Oregano. Thyme. [name]. Balsalmic vinegar. Tahini. Hummus. Tomato sauce. Olives. Mushrooms. ? Limit these  Grains: Prepackaged pasta or rice dishes. Prepackaged cereal with added sugar.  Vegetables: Deep fried potatoes (french fries).  Fruits: Fruit canned in syrup.  Meats and other protein foods: Beef. Pork. Lamb. Poultry with skin. Hot dogs. [place] Salines.  Dairy: Ice cream. Sour cream. Whole milk.  Beverages: Juice. Sugar-sweetened soft drinks. Beer. Liquor and spirits.  Fats and oils: Butter. Canola oil. Vegetable oil. Beef fat (tallow). Lard.  Sweets and desserts: Cookies. Cakes. Pies. Candy.  Seasoning and other foods: Mayonnaise. Premade sauces and marinades. The items listed may not be a complete list. Talk with your dietitian about what dietary choices are right for you. Summary  The Mediterranean diet includes both food and lifestyle choices.  Eat a variety of fresh fruits and vegetables, beans, nuts, seeds, and whole grains.  Limit the amount of red meat and sweets that you eat.  Talk with your health care provider about whether it is safe for you to drink red wine in moderation. This means 1 glass a day for nonpregnant women and 2 glasses a day for men. A glass of wine equals 5 oz (150 mL). This information  is not intended to replace advice given to you by your health care provider. Make sure you discuss any questions you have with your health care provider. Document Revised: 12/24/2015 Document Reviewed: 12/17/2015 Elsevier Patient Education  [place] Maintenance After Age 84 After age 58, you are at a higher risk for certain long-term diseases and infections as well as injuries from falls. Falls are a major cause of broken bones and head injuries in people who are older than age 17. Getting regular preventive care can help to keep you healthy and well. Preventive care includes getting regular testing and making lifestyle changes as recommended by your health care provider. Talk with your health care provider about:  Which screenings and tests you should have. A screening is a test that checks for a disease when you have no symptoms.  A diet and exercise plan that is right for you. What should I know about screenings and tests to prevent falls? Screening and testing are the best ways to find a health problem early. Early diagnosis and treatment give you the best chance of managing medical conditions that are common after age 82. Certain conditions and lifestyle choices may make you more likely to have a fall. Your  health care provider may recommend:  Regular vision checks. Poor vision and conditions such as cataracts can make you more likely to have a fall. If you wear glasses, make sure to get your prescription updated if your vision changes.  Medicine review. Work with your health care provider to regularly review all of the medicines you are taking, including over-the-counter medicines. Ask your health care provider about any side effects that may make you more likely to have a fall. Tell your health care provider if any medicines that you take make you feel dizzy or sleepy.  Osteoporosis screening. Osteoporosis is a condition that causes the bones to get weaker. This can make the  bones weak and cause them to break more easily.  Blood pressure screening. Blood pressure changes and medicines to control blood pressure can make you feel dizzy.  Strength and balance checks. Your health care provider may recommend certain tests to check your strength and balance while standing, walking, or changing positions.  Foot health exam. Foot pain and numbness, as well as not wearing proper footwear, can make you more likely to have a fall.  Depression screening. You may be more likely to have a fall if you have a fear of falling, feel emotionally low, or feel unable to do activities that you used to do.  Alcohol use screening. Using too much alcohol can affect your balance and may make you more likely to have a fall. What actions can I take to lower my risk of falls? General instructions  Talk with your health care provider about your risks for falling. Tell your health care provider if: ? You fall. Be sure to tell your health care provider about all falls, even ones that seem minor. ? You feel dizzy, sleepy, or off-balance.  Take over-the-counter and prescription medicines only as told by your health care provider. These include any supplements.  Eat a healthy diet and maintain a healthy weight. A healthy diet includes low-fat dairy products, low-fat (lean) meats, and fiber from whole grains, beans, and lots of fruits and vegetables. Home safety  Remove any tripping hazards, such as rugs, cords, and clutter.  Install safety equipment such as grab bars in bathrooms and safety rails on stairs.  Keep rooms and walkways well-lit. Activity  Follow a regular exercise program to stay fit. This will help you maintain your balance. Ask your health care provider what types of exercise are appropriate for you.  If you need a cane or walker, use it as recommended by your health care provider.  Wear supportive shoes that have nonskid soles.   Lifestyle  Do not drink alcohol if your  health care provider tells you not to drink.  If you drink alcohol, limit how much you have: ? 0-1 drink a day for women. ? 0-2 drinks a day for men.  Be aware of how much alcohol is in your drink. In the [place] one drink equals one typical bottle of beer (12 oz), one-half glass of wine (5 oz), or one shot of hard liquor (1 oz).  Do not use any products that contain nicotine or tobacco, such as cigarettes and e-cigarettes. If you need help quitting, ask your health care provider. Summary  Having a healthy lifestyle and getting preventive care can help to protect your health and wellness after age 29.  Screening and testing are the best way to find a health problem early and help you avoid having a fall. Early diagnosis and treatment give you the  best chance for managing medical conditions that are more common for people who are older than age 10.  Falls are a major cause of broken bones and head injuries in people who are older than age 76. Take precautions to prevent a fall at home.  Work with your health care provider to learn what changes you can make to improve your health and wellness and to prevent falls. This information is not intended to replace advice given to you by your health care provider. Make sure you discuss any questions you have with your health care provider. Document Revised: 08/16/2018 Document Reviewed: 03/08/2017 Elsevier Patient Education  2021 [place] Your Hypertension Hypertension, also called high blood pressure, is when the force of the blood pressing against the walls of the arteries is too strong. Arteries are blood vessels that carry blood from your heart throughout your body. Hypertension forces the heart to work harder to pump blood and may cause the arteries to become narrow or stiff. Understanding blood pressure readings Your personal target blood pressure may vary depending on your medical conditions, your age, and other factors. A blood  pressure reading includes a higher number over a lower number. Ideally, your blood pressure should be below 120/80. You should know that:  The first, or top, number is called the systolic pressure. It is a measure of the pressure in your arteries as your heart beats.  The second, or bottom number, is called the diastolic pressure. It is a measure of the pressure in your arteries as the heart relaxes. Blood pressure is classified into four stages. Based on your blood pressure reading, your health care provider may use the following stages to determine what type of treatment you need, if any. Systolic pressure and diastolic pressure are measured in a unit called mmHg. Normal  Systolic pressure: below 625.  Diastolic pressure: below 80. Elevated  Systolic pressure: 638-937.  Diastolic pressure: below 80. Hypertension stage 1  Systolic pressure: 342-876.  Diastolic pressure: 81-15. Hypertension stage 2  Systolic pressure: 726 or above.  Diastolic pressure: 90 or above. How can this condition affect me? Managing your hypertension is an important responsibility. Over time, hypertension can damage the arteries and decrease blood flow to important parts of the body, including the brain, heart, and kidneys. Having untreated or uncontrolled hypertension can lead to:  A heart attack.  A stroke.  A weakened blood vessel (aneurysm).  Heart failure.  Kidney damage.  Eye damage.  Metabolic syndrome.  Memory and concentration problems.  Vascular dementia. What actions can I take to manage this condition? Hypertension can be managed by making lifestyle changes and possibly by taking medicines. Your health care provider will help you make a plan to bring your blood pressure within a normal range. Nutrition  Eat a diet that is high in fiber and potassium, and low in salt (sodium), added sugar, and fat. An example eating plan is called the Dietary Approaches to Stop Hypertension (DASH)  diet. To eat this way: ? Eat plenty of fresh fruits and vegetables. Try to fill one-half of your plate at each meal with fruits and vegetables. ? Eat whole grains, such as whole-wheat pasta, brown rice, or whole-grain bread. Fill about one-fourth of your plate with whole grains. ? Eat low-fat dairy products. ? Avoid fatty cuts of meat, processed or cured meats, and poultry with skin. Fill about one-fourth of your plate with lean proteins such as fish, chicken without skin, beans, eggs, and tofu. ? Avoid  pre-made and processed foods. These tend to be higher in sodium, added sugar, and fat.  Reduce your daily sodium intake. Most people with hypertension should eat less than 1,500 mg of sodium a day.   Lifestyle  Work with your health care provider to maintain a healthy body weight or to lose weight. Ask what an ideal weight is for you.  Get at least 30 minutes of exercise that causes your heart to beat faster (aerobic exercise) most days of the week. Activities may include walking, swimming, or biking.  Include exercise to strengthen your muscles (resistance exercise), such as weight lifting, as part of your weekly exercise routine. Try to do these types of exercises for 30 minutes at least 3 days a week.  Do not use any products that contain nicotine or tobacco, such as cigarettes, e-cigarettes, and chewing tobacco. If you need help quitting, ask your health care provider.  Control any long-term (chronic) conditions you have, such as high cholesterol or diabetes.  Identify your sources of stress and find ways to manage stress. This may include meditation, deep breathing, or making time for fun activities.   Alcohol use  Do not drink alcohol if: ? Your health care provider tells you not to drink. ? You are pregnant, may be pregnant, or are planning to become pregnant.  If you drink alcohol: ? Limit how much you use to:  0-1 drink a day for women.  0-2 drinks a day for men. ? Be aware  of how much alcohol is in your drink. In the [place] one drink equals one 12 oz bottle of beer (355 mL), one 5 oz glass of wine (148 mL), or one 1 oz glass of hard liquor (44 mL). Medicines Your health care provider may prescribe medicine if lifestyle changes are not enough to get your blood pressure under control and if:  Your systolic blood pressure is 130 or higher.  Your diastolic blood pressure is 80 or higher. Take medicines only as told by your health care provider. Follow the directions carefully. Blood pressure medicines must be taken as told by your health care provider. The medicine does not work as well when you skip doses. Skipping doses also puts you at risk for problems. Monitoring Before you monitor your blood pressure:  Do not smoke, drink caffeinated beverages, or exercise within 30 minutes before taking a measurement.  Use the bathroom and empty your bladder (urinate).  Sit quietly for at least 5 minutes before taking measurements. Monitor your blood pressure at home as told by your health care provider. To do this:  Sit with your back straight and supported.  Place your feet flat on the floor. Do not cross your legs.  Support your arm on a flat surface, such as a table. Make sure your upper arm is at heart level.  Each time you measure, take two or three readings one minute apart and record the results. You may also need to have your blood pressure checked regularly by your health care provider.   General information  Talk with your health care provider about your diet, exercise habits, and other lifestyle factors that may be contributing to hypertension.  Review all the medicines you take with your health care provider because there may be side effects or interactions.  Keep all visits as told by your health care provider. Your health care provider can help you create and adjust your plan for managing your high blood pressure. Where to find more  information  National Heart, Lung, and Blood Institute: https://wilson-eaton.com/  American Heart Association: www.heart.org Contact a health care provider if:  You think you are having a reaction to medicines you have taken.  You have repeated (recurrent) headaches.  You feel dizzy.  You have swelling in your ankles.  You have trouble with your vision. Get help right away if:  You develop a severe headache or confusion.  You have unusual weakness or numbness, or you feel faint.  You have severe pain in your chest or abdomen.  You vomit repeatedly.  You have trouble breathing. These symptoms may represent a serious problem that is an emergency. Do not wait to see if the symptoms will go away. Get medical help right away. Call your local emergency services (911 in the [place] Do not drive yourself to the hospital. Summary  Hypertension is when the force of blood pumping through your arteries is too strong. If this condition is not controlled, it may put you at risk for serious complications.  Your personal target blood pressure may vary depending on your medical conditions, your age, and other factors. For most people, a normal blood pressure is less than 120/80.  Hypertension is managed by lifestyle changes, medicines, or both.  Lifestyle changes to help manage hypertension include losing weight, eating a healthy, low-sodium diet, exercising more, stopping smoking, and limiting alcohol. This information is not intended to replace advice given to you by your health care provider. Make sure you discuss any questions you have with your health care provider. Document Revised: 05/31/2019 Document Reviewed: 03/26/2019 Elsevier Patient Education  2021 [place]. Hypertension, Adult Hypertension is another name for high blood pressure. High blood pressure forces your heart to work harder to pump blood. This can cause problems over time. There are two numbers in a blood pressure reading.  There is a top number (systolic) over a bottom number (diastolic). It is best to have a blood pressure that is below 120/80. Healthy choices can help lower your blood pressure, or you may need medicine to help lower it. What are the causes? The cause of this condition is not known. Some conditions may be related to high blood pressure. What increases the risk?  Smoking.  Having type 2 diabetes mellitus, high cholesterol, or both.  Not getting enough exercise or physical activity.  Being overweight.  Having too much fat, sugar, calories, or salt (sodium) in your diet.  Drinking too much alcohol.  Having long-term (chronic) kidney disease.  Having a family history of high blood pressure.  Age. Risk increases with age.  Race. You may be at higher risk if you are African American.  Gender. Men are at higher risk than women before age 84. After age 23, women are at higher risk than men.  Having obstructive sleep apnea.  Stress. What are the signs or symptoms?  High blood pressure may not cause symptoms. Very high blood pressure (hypertensive crisis) may cause: ? Headache. ? Feelings of worry or nervousness (anxiety). ? Shortness of breath. ? Nosebleed. ? A feeling of being sick to your stomach (nausea). ? Throwing up (vomiting). ? Changes in how you see. ? Very bad chest pain. ? Seizures. How is this treated?  This condition is treated by making healthy lifestyle changes, such as: ? Eating healthy foods. ? Exercising more. ? Drinking less alcohol.  Your health care provider may prescribe medicine if lifestyle changes are not enough to get your blood pressure under control, and if: ? Your top number  is above 130. ? Your bottom number is above 80.  Your personal target blood pressure may vary. Follow these instructions at home: Eating and drinking  If told, follow the DASH eating plan. To follow this plan: ? Fill one half of your plate at each meal with fruits and  vegetables. ? Fill one fourth of your plate at each meal with whole grains. Whole grains include whole-wheat pasta, brown rice, and whole-grain bread. ? Eat or drink low-fat dairy products, such as skim milk or low-fat yogurt. ? Fill one fourth of your plate at each meal with low-fat (lean) proteins. Low-fat proteins include fish, chicken without skin, eggs, beans, and tofu. ? Avoid fatty meat, cured and processed meat, or chicken with skin. ? Avoid pre-made or processed food.  Eat less than 1,500 mg of salt each day.  Do not drink alcohol if: ? Your doctor tells you not to drink. ? You are pregnant, may be pregnant, or are planning to become pregnant.  If you drink alcohol: ? Limit how much you use to:  0-1 drink a day for women.  0-2 drinks a day for men. ? Be aware of how much alcohol is in your drink. In the [place] one drink equals one 12 oz bottle of beer (355 mL), one 5 oz glass of wine (148 mL), or one 1 oz glass of hard liquor (44 mL).   Lifestyle  Work with your doctor to stay at a healthy weight or to lose weight. Ask your doctor what the best weight is for you.  Get at least 30 minutes of exercise most days of the week. This may include walking, swimming, or biking.  Get at least 30 minutes of exercise that strengthens your muscles (resistance exercise) at least 3 days a week. This may include lifting weights or doing Pilates.  Do not use any products that contain nicotine or tobacco, such as cigarettes, e-cigarettes, and chewing tobacco. If you need help quitting, ask your doctor.  Check your blood pressure at home as told by your doctor.  Keep all follow-up visits as told by your doctor. This is important.   Medicines  Take over-the-counter and prescription medicines only as told by your doctor. Follow directions carefully.  Do not skip doses of blood pressure medicine. The medicine does not work as well if you skip doses. Skipping doses also puts you at risk for  problems.  Ask your doctor about side effects or reactions to medicines that you should watch for. Contact a doctor if you:  Think you are having a reaction to the medicine you are taking.  Have headaches that keep coming back (recurring).  Feel dizzy.  Have swelling in your ankles.  Have trouble with your vision. Get help right away if you:  Get a very bad headache.  Start to feel mixed up (confused).  Feel weak or numb.  Feel faint.  Have very bad pain in your: ? Chest. ? Belly (abdomen).  Throw up more than once.  Have trouble breathing. Summary  Hypertension is another name for high blood pressure.  High blood pressure forces your heart to work harder to pump blood.  For most people, a normal blood pressure is less than 120/80.  Making healthy choices can help lower blood pressure. If your blood pressure does not get lower with healthy choices, you may need to take medicine. This information is not intended to replace advice given to you by your health care provider. Make sure you  discuss any questions you have with your health care provider. Document Revised: 01/03/2018 Document Reviewed: 01/03/2018 Elsevier Patient Education  2021 [place]. Semaglutide injection solution What is this medicine? SEMAGLUTIDE (Sem a GLOO tide) is used to improve blood sugar control in adults with type 2 diabetes. This medicine may be used with other diabetes medicines. This drug may also reduce the risk of heart attack or stroke if you have type 2 diabetes and risk factors for heart disease. This medicine may be used for other purposes; ask your health care provider or pharmacist if you have questions. COMMON BRAND NAME(S): OZEMPIC What should I tell my health care provider before I take this medicine? They need to know if you have any of these conditions:  endocrine tumors (MEN 2) or if someone in your family had these tumors  eye disease, vision problems  history of  pancreatitis  kidney disease  stomach problems  thyroid cancer or if someone in your family had thyroid cancer  an unusual or allergic reaction to semaglutide, other medicines, foods, dyes, or preservatives  pregnant or trying to get pregnant  breast-feeding How should I use this medicine? This medicine is for injection under the skin of your upper leg (thigh), stomach area, or upper arm. It is given once every week (every 7 days). You will be taught how to prepare and give this medicine. Use exactly as directed. Take your medicine at regular intervals. Do not take it more often than directed. If you use this medicine with insulin, you should inject this medicine and the insulin separately. Do not mix them together. Do not give the injections right next to each other. Change (rotate) injection sites with each injection. It is important that you put your used needles and syringes in a special sharps container. Do not put them in a trash can. If you do not have a sharps container, call your pharmacist or healthcare provider to get one. A special MedGuide will be given to you by the pharmacist with each prescription and refill. Be sure to read this information carefully each time. This drug comes with INSTRUCTIONS FOR USE. Ask your pharmacist for directions on how to use this drug. Read the information carefully. Talk to your pharmacist or health care provider if you have questions. Talk to your pediatrician regarding the use of this medicine in children. Special care may be needed. Overdosage: If you think you have taken too much of this medicine contact a poison control center or emergency room at once. NOTE: This medicine is only for you. Do not share this medicine with others. What if I miss a dose? If you miss a dose, take it as soon as you can within 5 days after the missed dose. Then take your next dose at your regular weekly time. If it has been longer than 5 days after the missed dose,  do not take the missed dose. Take the next dose at your regular time. Do not take double or extra doses. If you have questions about a missed dose, contact your health care provider for advice. What may interact with this medicine?  other medicines for diabetes Many medications may cause changes in blood sugar, these include:  alcohol containing beverages  antiviral medicines for HIV or AIDS  aspirin and aspirin-like drugs  certain medicines for blood pressure, heart disease, irregular heart beat  chromium  diuretics  female hormones, such as estrogens or progestins, birth control pills  fenofibrate  gemfibrozil  isoniazid  lanreotide  female hormones or anabolic steroids  MAOIs like Carbex, Eldepryl, Marplan, Nardil, and Parnate  medicines for weight loss  medicines for allergies, asthma, cold, or cough  medicines for depression, anxiety, or psychotic disturbances  niacin  nicotine  NSAIDs, medicines for pain and inflammation, like ibuprofen or naproxen  octreotide  pasireotide  pentamidine  phenytoin  probenecid  quinolone antibiotics such as ciprofloxacin, levofloxacin, ofloxacin  some herbal dietary supplements  steroid medicines such as prednisone or cortisone  sulfamethoxazole; trimethoprim  thyroid hormones Some medications can hide the warning symptoms of low blood sugar (hypoglycemia). You may need to monitor your blood sugar more closely if you are taking one of these medications. These include:  beta-blockers, often used for high blood pressure or heart problems (examples include atenolol, metoprolol, propranolol)  clonidine  guanethidine  reserpine This list may not describe all possible interactions. Give your health care provider a list of all the medicines, herbs, non-prescription drugs, or dietary supplements you use. Also tell them if you smoke, drink alcohol, or use illegal drugs. Some items may interact with your medicine. What  should I watch for while using this medicine? Visit your doctor or health care professional for regular checks on your progress. Drink plenty of fluids while taking this medicine. Check with your doctor or health care professional if you get an attack of severe diarrhea, nausea, and vomiting. The loss of too much body fluid can make it dangerous for you to take this medicine. A test called the HbA1C (A1C) will be monitored. This is a simple blood test. It measures your blood sugar control over the last 2 to 3 months. You will receive this test every 3 to 6 months. Learn how to check your blood sugar. Learn the symptoms of low and high blood sugar and how to manage them. Always carry a quick-source of sugar with you in case you have symptoms of low blood sugar. Examples include hard sugar candy or glucose tablets. Make sure others know that you can choke if you eat or drink when you develop serious symptoms of low blood sugar, such as seizures or unconsciousness. They must get medical help at once. Tell your doctor or health care professional if you have high blood sugar. You might need to change the dose of your medicine. If you are sick or exercising more than usual, you might need to change the dose of your medicine. Do not skip meals. Ask your doctor or health care professional if you should avoid alcohol. Many nonprescription cough and cold products contain sugar or alcohol. These can affect blood sugar. Pens should never be shared. Even if the needle is changed, sharing may result in passing of viruses like hepatitis or HIV. Wear a medical ID bracelet or chain, and carry a card that describes your disease and details of your medicine and dosage times. Do not become pregnant while taking this medicine. Women should inform their doctor if they wish to become pregnant or think they might be pregnant. There is a potential for serious side effects to an unborn child. Talk to your health care professional  or pharmacist for more information. What side effects may I notice from receiving this medicine? Side effects that you should report to your doctor or health care professional as soon as possible:  allergic reactions like skin rash, itching or hives, swelling of the face, lips, or tongue  breathing problems  changes in vision  diarrhea that continues or is severe  lump  or swelling on the neck  severe nausea  signs and symptoms of infection like fever or chills; cough; sore throat; pain or trouble passing urine  signs and symptoms of low blood sugar such as feeling anxious, confusion, dizziness, increased hunger, unusually weak or tired, sweating, shakiness, cold, irritable, headache, blurred vision, fast heartbeat, loss of consciousness  signs and symptoms of kidney injury like trouble passing urine or change in the amount of urine  trouble swallowing  unusual stomach upset or pain  vomiting Side effects that usually do not require medical attention (report to your doctor or health care professional if they continue or are bothersome):  constipation  diarrhea  nausea  pain, redness, or irritation at site where injected  stomach upset This list may not describe all possible side effects. Call your doctor for medical advice about side effects. You may report side effects to FDA at 1-800-FDA-1088. Where should I keep my medicine? Keep out of the reach of children. Store unopened pens in a refrigerator between 2 and 8 degrees C (36 and 46 degrees F). Do not freeze. Protect from light and heat. After you first use the pen, it can be stored for 56 days at room temperature between 15 and 30 degrees C (59 and 86 degrees F) or in a refrigerator. Throw away your used pen after 56 days or after the expiration date, whichever comes first. Do not store your pen with the needle attached. If the needle is left on, medicine may leak from the pen. NOTE: This sheet is a summary. It may not  cover all possible information. If you have questions about this medicine, talk to your doctor, pharmacist, or health care provider.  2021 Elsevier/Gold Standard (2019-01-08 09:41:51)   [email or web address]">  DASH Eating Plan DASH stands for Dietary Approaches to Stop Hypertension. The DASH eating plan is a healthy eating plan that has been shown to:  Reduce high blood pressure (hypertension).  Reduce your risk for type 2 diabetes, heart disease, and stroke.  Help with weight loss. What are tips for following this plan? Reading food labels  Check food labels for the amount of salt (sodium) per serving. Choose foods with less than 5 percent of the Daily Value of sodium. Generally, foods with less than 300 milligrams (mg) of sodium per serving fit into this eating plan.  To find whole grains, look for the word "whole" as the first word in the ingredient list. Shopping  Buy products labeled as "low-sodium" or "no salt added."  Buy fresh foods. Avoid canned foods and pre-made or frozen meals. Cooking  Avoid adding salt when cooking. Use salt-free seasonings or herbs instead of table salt or sea salt. Check with your health care provider or pharmacist before using salt substitutes.  Do not fry foods. Cook foods using healthy methods such as baking, boiling, grilling, roasting, and broiling instead.  Cook with heart-healthy oils, such as olive, canola, avocado, soybean, or sunflower oil. Meal planning  Eat a balanced diet that includes: ? 4 or more servings of fruits and 4 or more servings of vegetables each day. Try to fill one-half of your plate with fruits and vegetables. ? 6-8 servings of whole grains each day. ? Less than 6 oz (170 g) of lean meat, poultry, or fish each day. A 3-oz (85-g) serving of meat is about the same size as a deck of cards. One egg equals 1 oz (28 g). ? 2-3 servings of low-fat dairy each day. One serving is 1  cup  (237 mL). ? 1 serving of nuts, seeds, or beans 5 times each week. ? 2-3 servings of heart-healthy fats. Healthy fats called omega-3 fatty acids are found in foods such as walnuts, flaxseeds, fortified milks, and eggs. These fats are also found in cold-water fish, such as sardines, salmon, and mackerel.  Limit how much you eat of: ? Canned or prepackaged foods. ? Food that is high in trans fat, such as some fried foods. ? Food that is high in saturated fat, such as fatty meat. ? Desserts and other sweets, sugary drinks, and other foods with added sugar. ? Full-fat dairy products.  Do not salt foods before eating.  Do not eat more than 4 egg yolks a week.  Try to eat at least 2 vegetarian meals a week.  Eat more home-cooked food and less restaurant, buffet, and fast food.   Lifestyle  When eating at a restaurant, ask that your food be prepared with less salt or no salt, if possible.  If you drink alcohol: ? Limit how much you use to:  0-1 drink a day for women who are not pregnant.  0-2 drinks a day for men. ? Be aware of how much alcohol is in your drink. In the [place] one drink equals one 12 oz bottle of beer (355 mL), one 5 oz glass of wine (148 mL), or one 1 oz glass of hard liquor (44 mL). General information  Avoid eating more than 2,300 mg of salt a day. If you have hypertension, you may need to reduce your sodium intake to 1,500 mg a day.  Work with your health care provider to maintain a healthy body weight or to lose weight. Ask what an ideal weight is for you.  Get at least 30 minutes of exercise that causes your heart to beat faster (aerobic exercise) most days of the week. Activities may include walking, swimming, or biking.  Work with your health care provider or dietitian to adjust your eating plan to your individual calorie needs. What foods should I eat? Fruits All fresh, dried, or frozen fruit. Canned fruit in natural juice (without added  sugar). Vegetables Fresh or frozen vegetables (raw, steamed, roasted, or grilled). Low-sodium or reduced-sodium tomato and vegetable juice. Low-sodium or reduced-sodium tomato sauce and tomato paste. Low-sodium or reduced-sodium canned vegetables. Grains Whole-grain or whole-wheat bread. Whole-grain or whole-wheat pasta. Brown rice. [place]. Bulgur. Whole-grain and low-sodium cereals. Pita bread. Low-fat, low-sodium crackers. Whole-wheat flour tortillas. Meats and other proteins Skinless chicken or [place]. Ground chicken or [place]. Pork with fat trimmed off. Fish and seafood. Egg whites. Dried beans, peas, or lentils. Unsalted nuts, nut butters, and seeds. Unsalted canned beans. Lean cuts of beef with fat trimmed off. Low-sodium, lean precooked or cured meat, such as sausages or meat loaves. Dairy Low-fat (1%) or fat-free (skim) milk. Reduced-fat, low-fat, or fat-free cheeses. Nonfat, low-sodium ricotta or cottage cheese. Low-fat or nonfat yogurt. Low-fat, low-sodium cheese. Fats and oils Soft margarine without trans fats. Vegetable oil. Reduced-fat, low-fat, or light mayonnaise and salad dressings (reduced-sodium). Canola, safflower, olive, avocado, soybean, and sunflower oils. Avocado. Seasonings and condiments Herbs. Spices. Seasoning mixes without salt. Other foods Unsalted popcorn and pretzels. Fat-free sweets. The items listed above may not be a complete list of foods and beverages you can eat. Contact a dietitian for more information. What foods should I avoid? Fruits Canned fruit in a light or heavy syrup. Fried fruit. Fruit in cream or butter sauce. Vegetables Creamed or fried  vegetables. Vegetables in a cheese sauce. Regular canned vegetables (not low-sodium or reduced-sodium). Regular canned tomato sauce and paste (not low-sodium or reduced-sodium). Regular tomato and vegetable juice (not low-sodium or reduced-sodium). Angie Fava. Olives. Grains Baked goods made with fat, such  as croissants, muffins, or some breads. Dry pasta or rice meal packs. Meats and other proteins Fatty cuts of meat. Ribs. Fried meat. Berniece Salines. Bologna, salami, and other precooked or cured meats, such as sausages or meat loaves. Fat from the back of a pig (fatback). Bratwurst. Salted nuts and seeds. Canned beans with added salt. Canned or smoked fish. Whole eggs or egg yolks. Chicken or [place] with skin. Dairy Whole or 2% milk, cream, and half-and-half. Whole or full-fat cream cheese. Whole-fat or sweetened yogurt. Full-fat cheese. Nondairy creamers. Whipped toppings. Processed cheese and cheese spreads. Fats and oils Butter. Stick margarine. Lard. Shortening. Ghee. Bacon fat. Tropical oils, such as coconut, palm kernel, or palm oil. Seasonings and condiments Onion salt, garlic salt, seasoned salt, table salt, and sea salt. Worcestershire sauce. Tartar sauce. Barbecue sauce. Teriyaki sauce. Soy sauce, including reduced-sodium. Steak sauce. Canned and packaged gravies. Fish sauce. Oyster sauce. Cocktail sauce. Store-bought horseradish. Ketchup. Mustard. Meat flavorings and tenderizers. Bouillon cubes. Hot sauces. Pre-made or packaged marinades. Pre-made or packaged taco seasonings. Relishes. Regular salad dressings. Other foods Salted popcorn and pretzels. The items listed above may not be a complete list of foods and beverages you should avoid. Contact a dietitian for more information. Where to find more information  National Heart, Lung, and Blood Institute: https://wilson-eaton.com/  American Heart Association: www.heart.org  Academy of Nutrition and Dietetics: www.eatright.[place]: www.kidney.org Summary  The DASH eating plan is a healthy eating plan that has been shown to reduce high blood pressure (hypertension). It may also reduce your risk for type 2 diabetes, heart disease, and stroke.  When on the DASH eating plan, aim to eat more fresh fruits and vegetables, whole  grains, lean proteins, low-fat dairy, and heart-healthy fats.  With the DASH eating plan, you should limit salt (sodium) intake to 2,300 mg a day. If you have hypertension, you may need to reduce your sodium intake to 1,500 mg a day.  Work with your health care provider or dietitian to adjust your eating plan to your individual calorie needs. This information is not intended to replace advice given to you by your health care provider. Make sure you discuss any questions you have with your health care provider. Document Revised: 03/29/2019 Document Reviewed: 03/29/2019 Elsevier Patient Education  2021 [employer].

## 2020-08-28 ENCOUNTER — Ambulatory Visit (INDEPENDENT_AMBULATORY_CARE_PROVIDER_SITE_OTHER): Payer: Medicare Other | Admitting: Psychology

## 2020-08-28 DIAGNOSIS — F3289 Other specified depressive episodes: Secondary | ICD-10-CM | POA: Diagnosis not present

## 2020-09-02 ENCOUNTER — Other Ambulatory Visit: Payer: Self-pay

## 2020-09-02 ENCOUNTER — Telehealth (INDEPENDENT_AMBULATORY_CARE_PROVIDER_SITE_OTHER): Payer: Medicare Other | Admitting: Psychiatry

## 2020-09-02 ENCOUNTER — Encounter: Payer: Self-pay | Admitting: Psychiatry

## 2020-09-02 DIAGNOSIS — F3131 Bipolar disorder, current episode depressed, mild: Secondary | ICD-10-CM | POA: Diagnosis not present

## 2020-09-02 DIAGNOSIS — F4310 Post-traumatic stress disorder, unspecified: Secondary | ICD-10-CM | POA: Diagnosis not present

## 2020-09-02 DIAGNOSIS — F17200 Nicotine dependence, unspecified, uncomplicated: Secondary | ICD-10-CM | POA: Diagnosis not present

## 2020-09-02 DIAGNOSIS — Z634 Disappearance and death of family member: Secondary | ICD-10-CM | POA: Diagnosis not present

## 2020-09-02 DIAGNOSIS — F1421 Cocaine dependence, in remission: Secondary | ICD-10-CM

## 2020-09-02 DIAGNOSIS — F1021 Alcohol dependence, in remission: Secondary | ICD-10-CM

## 2020-09-02 NOTE — Progress Notes (Signed)
Virtual Visit via Video Note  I connected with Susan [name] on 09/02/20 at  1:20 PM EDT by a video enabled telemedicine application and verified that I am speaking with the correct person using two identifiers.  Location Provider Location : [facility] Patient Location : Home  Participants: Patient , Provider   I discussed the limitations of evaluation and management by telemedicine and the availability of in person appointments. The patient expressed understanding and agreed to proceed.   I discussed the assessment and treatment plan with the patient. The patient was provided an opportunity to ask questions and all were answered. The patient agreed with the plan and demonstrated an understanding of the instructions.   The patient was advised to call back or seek an in-person evaluation if the symptoms worsen or if the condition fails to improve as anticipated.   [place] MD OP Progress Note  09/02/2020 5:23 PM Susan [name]  MRN:  [id number]  Chief Complaint:  Chief Complaint    Follow-up; Anxiety; Depression     HPI: Susan [name] is a 60 year old Caucasian female, widowed, disabled, lives in [place], has a history of bipolar disorder, alcohol and cocaine use disorder in remission, tobacco use disorder, PTSD, bereavement, degenerative disc disease, obstructive sleep apnea on CPAP, history of total hip replacement, osteoarthritis, hepatitis C was evaluated by telemedicine today.  Patient today reports since being on the higher dosage of Latuda she has noticed improvement in her mood.  She reports she does not feel too irritable or depressed as she used to before.  She has been interacting with others better than before.  She continues to go for [name].  She reports sleep is overall okay.  She denies any suicidality, homicidality or perceptual disturbances.  Patient is currently trying to cut back smoking and is trying to start nicotine patches soon.  Patient denies any other  concerns today.  Visit Diagnosis:    ICD-10-CM   1. Mild depressed bipolar 1 disorder (HCC)  F31.31    partial remission  2. PTSD (post-traumatic stress disorder)  F43.10   3. Bereavement  Z63.4   4. Tobacco use disorder  F17.200   5. Alcohol use disorder, severe, in sustained remission ([place])  F10.21   6. Cocaine use disorder, moderate, in sustained remission ([place])  F14.21     Past Psychiatric History: I have reviewed past psychiatric history from progress note on 09/27/2019.  Past trials of Seroquel, Cymbalta, Abilify, risperidone  Past Medical History:  Past Medical History:  Diagnosis Date  . ADD (attention deficit disorder)   . Alcohol abuse   . Anemia   . Anxiety   . Aortic atherosclerosis ([place]) 02/20/2018   Chest CT Sept 2019  . Arthritis    rheumatoid arthritis  . Asthma   . Bipolar disorder ([place])   . Centrilobular emphysema ([place]) 02/20/2018   Chest CT Sept 2019  . Constipation   . Degenerative disc disease at L5-S1 level 09/28/2016   See ortho note May 2018  . Depression    bipolar, hx of suicide attempt  . Drug use   . Dyspnea    with exertion  . GERD (gastroesophageal reflux disease)   . H/O suicide attempt    slit wrists  . Hepatitis C 06/26/2014  . Hip pain   . History of alcohol abuse   . History of diverticulitis 2013  . History of hepatitis C    HEP "C"--three years ago  . History of MRSA infection 2013  .  HLD (hyperlipidemia)   . Hypertension   . Hypothyroidism   . Incisional hernia 11/08/2012  . Knee pain   . 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 Multinodular thyroid   . OSA (obstructive sleep apnea)    managed by Dr. Manuella Ghazi  . Osteoporosis   . Post-traumatic osteoarthritis of right knee 09/24/2015  . Prediabetes   . Recurrent ventral hernia 11/08/2012  . Sleep apnea     Dx 3 years ago. Use C-PAP  . 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 Thyroid disease    Goiter  . Vitamin B12 deficiency   . Vitamin D deficiency disease      Past Surgical History:  Procedure Laterality Date  . BILATERAL SALPINGOOPHORECTOMY     due to abnormal mass  . BREAST BIOPSY Left    neg  . BREAST SURGERY Left 20 yrs ago  . CESAREAN SECTION    . COLONOSCOPY    . COLONOSCOPY WITH PROPOFOL N/A 10/16/2017   Procedure: COLONOSCOPY WITH PROPOFOL;  Surgeon: Jonathon Bellows, MD;  [facility] END[facility]COPY;  Service: Gastroenterology;  Laterality: N/A;  . HERNIA REPAIR  06/2011, July 2014   Ventral wall repair with Physiomesh  . HERNIA REPAIR     2nd.vental wall repair  . JOINT REPLACEMENT Right    knee  . TONSILLECTOMY    . TOTAL HIP ARTHROPLASTY Left 07/23/2019   Procedure: TOTAL HIP ARTHROPLASTY;  Surgeon: Corky Mull, MD;  Location: [facility] ORS;  Service: Orthopedics;  Laterality: Left;  . TOTAL KNEE ARTHROPLASTY Right 05/10/2016   Procedure: TOTAL KNEE ARTHROPLASTY;  Surgeon: Corky Mull, MD;  Location: [facility] ORS;  Service: Orthopedics;  Laterality: Right;  . TUBAL LIGATION      Family Psychiatric History: I have reviewed family psychiatric history from my progress note on 09/27/2019  Family History:  Family History  Problem Relation Age of Onset  . Alcohol abuse Father   . Heart attack Father   . Arthritis Mother   . Asthma Mother   . Mental illness Mother   . Thyroid disease Mother   . COPD Mother   . Heart disease Mother   . Congestive Heart Failure Mother   . Alcohol abuse Mother   . Eating disorder Mother   . Bipolar disorder Mother   . Arthritis Brother   . Mental illness Brother   . Cancer Brother        non-hodkins lymphoma  . Alcohol abuse Sister   . Drug abuse Sister   . Mental illness Sister   . Mental illness Sister   . Fibromyalgia Sister   . Obesity Sister   . Pneumonia Sister   . Depression Sister   . Mental illness Sister   . Alcohol abuse Sister   . Drug abuse Sister   . Drug abuse Son   . Alcohol abuse Son   . Drug abuse Son   . Alcohol abuse Son   . Drug abuse Daughter   . Diabetes Neg Hx   .  Stroke Neg Hx   . Breast cancer Neg Hx     Social History: I have reviewed social history from my progress note on 09/27/2019 Social History   Socioeconomic History  . Marital status: Widowed    Spouse name: Not on file  . Number of children: 3  . Years of education: Not on file  . Highest education level: Some college, no degree  Occupational History  . Occupation: disabled  Tobacco Use  .  Smoking status: Current Every Day Smoker    Packs/day: 0.50    Years: 41.00    Pack years: 20.50    Types: Cigarettes  . Smokeless tobacco: Never Used  . Tobacco comment: REPORTED REDUCED TO HALF PACK, REPORTED  09/02/2020  Vaping Use  . Vaping Use: Former  . Quit date: 07/15/2017  Substance and Sexual Activity  . Alcohol use: No    Alcohol/week: 0.0 standard drinks    Comment: sober since October 2018  . 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 Sexual activity: Not Currently  Other Topics Concern  . Not on file  Social History Narrative  . Not on file   Social Determinants of Health   Financial Resource Strain: Low Risk   . Difficulty of Paying Living Expenses: Not hard at all  Food Insecurity: No Food Insecurity  . Worried About Charity fundraiser in the Last Year: Never true  . Ran Out of Food in the Last Year: Never true  Transportation Needs: No Transportation Needs  . Lack of Transportation (Medical): No  . Lack of Transportation (Non-Medical): No  Physical Activity: Inactive  . Days of Exercise per Week: 0 days  . Minutes of Exercise per Session: 0 min  Stress: No Stress Concern Present  . Feeling of Stress : Only a little  Social Connections: Moderately Isolated  . Frequency of Communication with Friends and Family: More than three times a week  . Frequency of Social Gatherings with Friends and Family: More than three times a week  . Attends Religious Services: Never  . Active Member of Clubs or Organizations: Yes  . Attends Theatre manager Meetings: More than 4 times per year  . Marital Status: Widowed    Allergies:  Allergies  Allergen Reactions  . Wellbutrin [Bupropion]     Patient reports made " deathly sick " years ago at appointment 03/18/20.  . Lasix [Furosemide] Other (See Comments)    Electrolyte imbalance    Metabolic Disorder Labs: Lab Results  Component Value Date   HGBA1C 5.1 08/26/2020   MPG 108 04/08/2019   MPG 103 08/16/2017   No results found for: PROLACTIN Lab Results  Component Value Date   CHOL 143 08/21/2019   TRIG 83 08/21/2019   HDL 47 (L) 08/21/2019   CHOLHDL 3.0 08/21/2019   VLDL 27 10/14/2016   LDLCALC 79 08/21/2019   LDLCALC 79 04/08/2019   Lab Results  Component Value Date   TSH 1.09 10/16/2019   TSH 1.430 07/26/2018    Therapeutic Level Labs: No results found for: LITHIUM No results found for: VALPROATE No components found for:  CBMZ  Current Medications: Current Outpatient Medications  Medication Sig Dispense Refill  . ACCU-CHEK GUIDE test strip TEST UP TO 4 TIMES A DAY 100 each 0  . Accu-Chek Softclix Lancets lancets TEST 4 TIMES A DAY 200 each 3  . albuterol (VENTOLIN HFA) 108 (90 Base) MCG/ACT inhaler Inhale 2 puffs into the lungs every 4 (four) hours as needed for wheezing or shortness of breath. 18 g 2  . atenolol (TENORMIN) 25 MG tablet TAKE 1 TABLET (25 MG TOTAL) BY MOUTH DAILY. 90 tablet 3  . atorvastatin (LIPITOR) 10 MG tablet TAKE 1 TABLET (10 MG TOTAL) BY MOUTH AT BEDTIME. 90 tablet 3  . BD PEN NEEDLE NANO U/F 32G X 4 MM MISC USE WITH OZEMPIC 100 each 0  . blood glucose meter kit and  supplies Dispense based on patient and insurance preference. Use up to four times daily as directed. (FOR ICD-10 E10.9, E11.9). 1 each 0  . clindamycin (CLEOCIN T) 1 % external solution Apply to aa's scalp QD PRN 60 mL 3  . clotrimazole (LOTRIMIN) 1 % cream Apply 1 application topically 2 (two) times daily. 30 g 0  . cyclobenzaprine (FLEXERIL) 5 MG tablet Take 5 mg  by mouth 2 (two) times daily as needed for muscle spasms.     . DULoxetine (CYMBALTA) 60 MG capsule Take 1 capsule (60 mg total) by mouth 2 (two) times daily. 180 capsule 3  . Fluticasone-Umeclidin-Vilant (TRELEGY ELLIPTA) 100-62.5-25 MCG/INH AEPB Inhale 1 puff into the lungs daily. 28 each 11  . gabapentin (NEURONTIN) 300 MG capsule Take 600 mg by mouth 2 (two) times daily.     . hydrochlorothiazide (HYDRODIURIL) 25 MG tablet TAKE 1 TABLET (25 MG TOTAL) BY MOUTH DAILY. 90 tablet 0  . HYDROcodone-acetaminophen (NORCO/VICODIN) 5-325 MG tablet 1 po qday prn.    . hydrOXYzine (ATARAX/VISTARIL) 50 MG tablet Take 1 tablet (50 mg total) by mouth every 8 (eight) hours as needed (may cause drowsiness.). 90 tablet 0  . ketoconazole (NIZORAL) 2 % shampoo SHAMPOO INTO THE SCALP AND LET SIT'10 MINUTES THEN WASH OUT, USE 3D/WK 120 mL 2  . losartan (COZAAR) 100 MG tablet TAKE 1 TABLET (100 MG TOTAL) BY MOUTH DAILY. 90 tablet 3  . lurasidone (LATUDA) 80 MG TABS tablet Take 1 tablet (80 mg total) by mouth daily with supper. 90 tablet 0  . meloxicam (MOBIC) 15 MG tablet Take by mouth.    . nicotine (NICODERM CQ - DOSED IN MG/24 HOURS) 14 mg/24hr patch Place 1 patch (14 mg total) onto the skin daily. 28 patch 0  . omeprazole (PRILOSEC) 40 MG capsule Take 1 capsule (40 mg total) by mouth daily. 90 capsule 3  . Semaglutide,0.25 or 0.5MG/DOS, (OZEMPIC, 0.25 OR 0.5 MG/DOSE,) 2 MG/1.5ML SOPN Inject 0.5 mg into the skin once a week for 12 doses. 1.5 mL 2  . umeclidinium-vilanterol (ANORO ELLIPTA) 62.5-25 MCG/INH AEPB Inhale 1 puff into the lungs daily. 60 each 5  . vitamin B-12 (CYANOCOBALAMIN) 1000 MCG tablet Take 1,000 mcg by mouth daily.     . Vitamin D, Ergocalciferol, (DRISDOL) 1.25 MG (50000 UNIT) CAPS capsule TAKE 1 CAPSULE BY MOUTH EVERY SEVEN DAYS. RECHECK LAB 1-2 WEEKS AFTER COMPLETION 12 capsule 0   No current facility-administered medications for this visit.     Musculoskeletal: Strength & Muscle Tone:  UTA Gait & Station: UTA Patient leans: N/A  Psychiatric Specialty Exam: Review of Systems  Psychiatric/Behavioral: Positive for dysphoric mood.  All other systems reviewed and are negative.   There were no vitals taken for this visit.There is no height or weight on file to calculate BMI.  General Appearance: Casual  Eye Contact:  Fair  Speech:  Normal Rate  Volume:  Normal  Mood:  Depressed Improving  Affect:  Restricted  Thought Process:  Goal Directed and Descriptions of Associations: Intact  Orientation:  Full (Time, Place, and Person)  Thought Content: Logical   Suicidal Thoughts:  No  Homicidal Thoughts:  No  Memory:  Immediate;   Fair Recent;   Fair Remote;   Fair  Judgement:  Fair  Insight:  Fair  Psychomotor Activity:  Normal  Concentration:  Concentration: Fair and Attention Span: Fair  Recall:  [employer] of Knowledge: Fair  Language: Fair  Akathisia:  No  Handed:  Right  AIMS (if indicated): UTA  Assets:  Communication Skills Desire for Improvement Housing Social Support  ADL's:  Intact  Cognition: WNL  Sleep:  Fair   Screenings: GAD-7   Flowsheet Row Office Visit from 11/06/2019 in [facility] Office Visit from 09/06/2018 in [place] [facility]  Total GAD-7 Score 21 14    PHQ2-9   Flowsheet Row Video Visit from 08/11/2020 in [place] Video Visit from 07/03/2020 in [place] Office Visit from 05/28/2020 in [place] from 05/27/2020 in [place] Visit from 11/06/2019 in [facility]  PHQ-2 Total Score '3 1 1 1 6  ' PHQ-9 Total Score 10 -- 6 -- 13    Flowsheet Row Video Visit from 08/11/2020 in [place] Video Visit from 07/03/2020 in [place] Low Risk Error: Q6 is Yes, you must answer 7       Assessment and Plan:  Susan [name] is a 60 year old Caucasian female who has a history of bipolar disorder, degenerative disc disease, multiple medical problems was evaluated by telemedicine today.  Patient is biologically predisposed given family history of mental health problems, history of substance abuse problems and trauma.  Patient is currently making progress.  Plan as noted below.  Plan Bipolar disorder type I depressed- improving Latuda 80 mg p.o. daily with supper Cymbalta as prescribed  PTSD-stable Cymbalta 60 mg p.o. daily Continue CBT with therapist- Mr. Barrett Shell Hydroxyzine 25 mg p.o. 3 times daily as needed  Bereavement-improving We will continue counseling.  Tobacco use disorder-improving Will monitor closely  Alcohol use disorder in remission Continue AA meetings  Follow-up in clinic in 1 month or sooner if needed.  This note was generated in part or whole with voice recognition software. Voice recognition is usually quite accurate but there are transcription errors that can and very often do occur. I apologize for any typographical errors that were not detected and corrected.        Ursula Alert, MD 09/03/2020, 5:40 PM

## 2020-09-23 ENCOUNTER — Ambulatory Visit (INDEPENDENT_AMBULATORY_CARE_PROVIDER_SITE_OTHER): Payer: Medicare Other | Admitting: Psychology

## 2020-09-23 DIAGNOSIS — F3289 Other specified depressive episodes: Secondary | ICD-10-CM

## 2020-09-24 ENCOUNTER — Other Ambulatory Visit: Payer: Self-pay | Admitting: Adult Health

## 2020-09-24 DIAGNOSIS — E119 Type 2 diabetes mellitus without complications: Secondary | ICD-10-CM

## 2020-09-24 DIAGNOSIS — Z720 Tobacco use: Secondary | ICD-10-CM

## 2020-09-25 ENCOUNTER — Other Ambulatory Visit: Payer: Self-pay | Admitting: Adult Health

## 2020-09-25 DIAGNOSIS — I10 Essential (primary) hypertension: Secondary | ICD-10-CM

## 2020-09-28 ENCOUNTER — Other Ambulatory Visit: Payer: Self-pay | Admitting: Adult Health

## 2020-09-28 DIAGNOSIS — E119 Type 2 diabetes mellitus without complications: Secondary | ICD-10-CM

## 2020-09-29 ENCOUNTER — Other Ambulatory Visit: Payer: Self-pay

## 2020-09-29 DIAGNOSIS — R0600 Dyspnea, unspecified: Secondary | ICD-10-CM

## 2020-09-29 DIAGNOSIS — R0609 Other forms of dyspnea: Secondary | ICD-10-CM

## 2020-09-29 DIAGNOSIS — J432 Centrilobular emphysema: Secondary | ICD-10-CM

## 2020-09-29 MED ORDER — TRELEGY ELLIPTA 100-62.5-25 MCG/INH IN AEPB: 1.0000 | INHALATION_SPRAY | Freq: Every day | RESPIRATORY_TRACT | 11 refills | Status: DC

## 2020-09-29 NOTE — Telephone Encounter (Signed)
Requested Prescriptions  Pending Prescriptions Disp Refills  . hydrochlorothiazide (HYDRODIURIL) 25 MG tablet [Pharmacy Med Name: hydroCHLOROthiazide 25MG  TABS*] 90 tablet 0    Sig: TAKE 1 TABLET (25 MG TOTAL) BY MOUTH DAILY.     Cardiovascular: Diuretics - Thiazide Failed - 09/25/2020 11:36 AM      Failed - Cr in normal range and within 360 days    Creat  Date Value Ref Range Status  10/16/2019 0.92 0.50 - 1.05 mg/dL Final    Comment:    For patients >34 years of age, the reference limit for Creatinine is approximately 13% higher for people identified as African-American. .    Creatinine, Ser  Date Value Ref Range Status  05/28/2020 1.10 (H) 0.57 - 1.00 mg/dL Final   Creatinine, Urine  Date Value Ref Range Status  10/16/2019 213 20 - 275 mg/dL Final         Failed - Last BP in normal range    BP Readings from Last 1 Encounters:  08/26/20 (!) 141/80         Passed - Ca in normal range and within 360 days    Calcium  Date Value Ref Range Status  05/28/2020 9.3 8.7 - 10.2 mg/dL Final   Calcium, Total  Date Value Ref Range Status  04/29/2014 9.1 8.5 - 10.1 mg/dL Final         Passed - K in normal range and within 360 days    Potassium  Date Value Ref Range Status  05/28/2020 3.8 3.5 - 5.2 mmol/L Final  04/29/2014 4.2 3.5 - 5.1 mmol/L Final         Passed - Na in normal range and within 360 days    Sodium  Date Value Ref Range Status  05/28/2020 137 134 - 144 mmol/L Final  04/29/2014 136 136 - 145 mmol/L Final         Passed - Valid encounter within last 6 months    Recent Outpatient Visits          1 month ago Type 2 diabetes mellitus without complication, without long-term current use of insulin ([place])   [facility], Kelby Aline, FNP   4 months ago Type 2 diabetes mellitus without complication, without long-term current use of insulin ([place])   [facility], Kelby Aline, FNP   6 months ago Weight gain   [facility], Kelby Aline, FNP   8 months ago Essential hypertension   [facility], Kelby Aline, FNP   9 months ago Essential hypertension   [facility], Wendee Beavers, PA-C      Future Appointments            In 1 month [facility], Kelby Aline, [place], [facility]

## 2020-10-09 ENCOUNTER — Encounter: Payer: Self-pay | Admitting: Psychiatry

## 2020-10-09 ENCOUNTER — Telehealth (INDEPENDENT_AMBULATORY_CARE_PROVIDER_SITE_OTHER): Payer: Medicare Other | Admitting: Psychiatry

## 2020-10-09 ENCOUNTER — Other Ambulatory Visit: Payer: Self-pay

## 2020-10-09 ENCOUNTER — Ambulatory Visit (INDEPENDENT_AMBULATORY_CARE_PROVIDER_SITE_OTHER): Payer: Medicare Other | Admitting: Psychology

## 2020-10-09 DIAGNOSIS — F4310 Post-traumatic stress disorder, unspecified: Secondary | ICD-10-CM

## 2020-10-09 DIAGNOSIS — F3289 Other specified depressive episodes: Secondary | ICD-10-CM

## 2020-10-09 DIAGNOSIS — F17200 Nicotine dependence, unspecified, uncomplicated: Secondary | ICD-10-CM

## 2020-10-09 DIAGNOSIS — F3131 Bipolar disorder, current episode depressed, mild: Secondary | ICD-10-CM

## 2020-10-09 DIAGNOSIS — F1021 Alcohol dependence, in remission: Secondary | ICD-10-CM | POA: Diagnosis not present

## 2020-10-09 DIAGNOSIS — F1421 Cocaine dependence, in remission: Secondary | ICD-10-CM

## 2020-10-09 MED ORDER — LURASIDONE HCL 60 MG PO TABS: 60.0000 mg | ORAL_TABLET | Freq: Every day | ORAL | 1 refills | Status: DC

## 2020-10-09 NOTE — Progress Notes (Signed)
Virtual Visit via Video Note  I connected with Susan [name] on 10/09/20 at 11:00 AM EDT by a video enabled telemedicine application and verified that I am speaking with the correct person using two identifiers.  Location Provider Location : [facility] Patient Location : Home  Participants: Patient , Provider   I discussed the limitations of evaluation and management by telemedicine and the availability of in person appointments. The patient expressed understanding and agreed to proceed.    I discussed the assessment and treatment plan with the patient. The patient was provided an opportunity to ask questions and all were answered. The patient agreed with the plan and demonstrated an understanding of the instructions.   The patient was advised to call back or seek an in-person evaluation if the symptoms worsen or if the condition fails to improve as anticipated.   [place] MD OP Progress Note  10/09/2020 12:27 PM Susan [name]  MRN:  [id number]  Chief Complaint:  Chief Complaint    Follow-up; Depression; Insomnia     HPI: Susan [name] is a 60 year old Caucasian female, widowed, disabled, lives in [place], has a history of bipolar disorder, alcohol and cocaine use disorder in remission, tobacco use disorder, PTSD, bereavement, degenerative disc disease, obstructive sleep apnea on CPAP, history of total hip replacement, osteoarthritis, hepatitis C was evaluated by telemedicine today.  Patient today reports she continues to have depressive episodes, feeling sad on and off however reports she feels it could be getting better on the medications.  She however reports she feels tired and fatigued a lot and wonders whether the Latuda higher dosage could be contributing to this.  Patient reports sleep is overall okay.  She does wake up because of her dog and also since she needs to urinate.  She however reports she has been trying to work on the sleep hygiene and is trying to restrict fluid at  the end of the day.  She looks forward to a vacation she is going to take soon.  She is going to spend some time with her brother.  She is hopeful that will help him to feel better.  Patient denies any suicidality, homicidality or perceptual disturbances.  She continues to go for [name].  Patient denies any other concerns today.  Visit Diagnosis:    ICD-10-CM   1. Mild depressed bipolar 1 disorder (HCC)  F31.31 Lurasidone HCl 60 MG TABS   partial remission  2. PTSD (post-traumatic stress disorder)  F43.10   3. Tobacco use disorder  F17.200   4. Alcohol use disorder, severe, in sustained remission ([name])  F10.21   5. Cocaine use disorder, moderate, in sustained remission ([place])  F14.21     Past Psychiatric History: I have reviewed past psychiatric history from progress note from 09/27/2019.  Past trials of Seroquel, Cymbalta, Abilify, risperidone  Past Medical History:  Past Medical History:  Diagnosis Date  . ADD (attention deficit disorder)   . Alcohol abuse   . Anemia   . Anxiety   . Aortic atherosclerosis ([place]) 02/20/2018   Chest CT Sept 2019  . Arthritis    rheumatoid arthritis  . Asthma   . Bipolar disorder ([place])   . Centrilobular emphysema ([place]) 02/20/2018   Chest CT Sept 2019  . Constipation   . Degenerative disc disease at L5-S1 level 09/28/2016   See ortho note May 2018  . Depression    bipolar, hx of suicide attempt  . Drug use   . Dyspnea  with exertion  . GERD (gastroesophageal reflux disease)   . H/O suicide attempt    slit wrists  . Hepatitis C 06/26/2014  . Hip pain   . History of alcohol abuse   . History of diverticulitis 2013  . History of hepatitis C    HEP "C"--three years ago  . History of MRSA infection 2013  . HLD (hyperlipidemia)   . Hypertension   . Hypothyroidism   . Incisional hernia 11/08/2012  . Knee pain   . 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 Multinodular thyroid   . OSA (obstructive sleep apnea)    managed by Dr. Manuella Ghazi  .  Osteoporosis   . Post-traumatic osteoarthritis of right knee 09/24/2015  . Prediabetes   . Recurrent ventral hernia 11/08/2012  . Sleep apnea     Dx 3 years ago. Use C-PAP  . 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 Thyroid disease    Goiter  . Vitamin B12 deficiency   . Vitamin D deficiency disease     Past Surgical History:  Procedure Laterality Date  . BILATERAL SALPINGOOPHORECTOMY     due to abnormal mass  . BREAST BIOPSY Left    neg  . BREAST SURGERY Left 20 yrs ago  . CESAREAN SECTION    . COLONOSCOPY    . COLONOSCOPY WITH PROPOFOL N/A 10/16/2017   Procedure: COLONOSCOPY WITH PROPOFOL;  Surgeon: Jonathon Bellows, MD;  Location: [facility];  Service: Gastroenterology;  Laterality: N/A;  . HERNIA REPAIR  06/2011, July 2014   Ventral wall repair with Physiomesh  . HERNIA REPAIR     2nd.vental wall repair  . JOINT REPLACEMENT Right    knee  . TONSILLECTOMY    . TOTAL HIP ARTHROPLASTY Left 07/23/2019   Procedure: TOTAL HIP ARTHROPLASTY;  Surgeon: Corky Mull, MD;  Location: [facility] ORS;  Service: Orthopedics;  Laterality: Left;  . TOTAL KNEE ARTHROPLASTY Right 05/10/2016   Procedure: TOTAL KNEE ARTHROPLASTY;  Surgeon: Corky Mull, MD;  Location: [facility] ORS;  Service: Orthopedics;  Laterality: Right;  . TUBAL LIGATION      Family Psychiatric History: I have reviewed family psychiatric history from progress note on 09/27/2019  Family History:  Family History  Problem Relation Age of Onset  . Alcohol abuse Father   . Heart attack Father   . Arthritis Mother   . Asthma Mother   . Mental illness Mother   . Thyroid disease Mother   . COPD Mother   . Heart disease Mother   . Congestive Heart Failure Mother   . Alcohol abuse Mother   . Eating disorder Mother   . Bipolar disorder Mother   . Arthritis Brother   . Mental illness Brother   . Cancer Brother        non-hodkins lymphoma  . Alcohol abuse Sister   . Drug abuse Sister   . Mental illness Sister    . Mental illness Sister   . Fibromyalgia Sister   . Obesity Sister   . Pneumonia Sister   . Depression Sister   . Mental illness Sister   . Alcohol abuse Sister   . Drug abuse Sister   . Drug abuse Son   . Alcohol abuse Son   . Drug abuse Son   . Alcohol abuse Son   . Drug abuse Daughter   . Diabetes Neg Hx   . Stroke Neg Hx   . Breast cancer Neg Hx     Social History:  I have reviewed social history from progress note on 09/27/2019 Social History   Socioeconomic History  . Marital status: Widowed    Spouse name: Not on file  . Number of children: 3  . Years of education: Not on file  . Highest education level: Some college, no degree  Occupational History  . Occupation: disabled  Tobacco Use  . Smoking status: Current Every Day Smoker    Packs/day: 0.50    Years: 41.00    Pack years: 20.50    Types: Cigarettes  . Smokeless tobacco: Never Used  . Tobacco comment: REPORTED REDUCED TO HALF PACK, REPORTED  09/02/2020  Vaping Use  . Vaping Use: Former  . Quit date: 07/15/2017  Substance and Sexual Activity  . Alcohol use: No    Alcohol/week: 0.0 standard drinks    Comment: sober since October 2018  . 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 Sexual activity: Not Currently  Other Topics Concern  . Not on file  Social History Narrative  . Not on file   Social Determinants of Health   Financial Resource Strain: Low Risk   . Difficulty of Paying Living Expenses: Not hard at all  Food Insecurity: No Food Insecurity  . Worried About Charity fundraiser in the Last Year: Never true  . Ran Out of Food in the Last Year: Never true  Transportation Needs: No Transportation Needs  . Lack of Transportation (Medical): No  . Lack of Transportation (Non-Medical): No  Physical Activity: Inactive  . Days of Exercise per Week: 0 days  . Minutes of Exercise per Session: 0 min  Stress: No Stress Concern Present  . Feeling of Stress : Only a little   Social Connections: Moderately Isolated  . Frequency of Communication with Friends and Family: More than three times a week  . Frequency of Social Gatherings with Friends and Family: More than three times a week  . Attends Religious Services: Never  . Active Member of Clubs or Organizations: Yes  . Attends Archivist Meetings: More than 4 times per year  . Marital Status: Widowed    Allergies:  Allergies  Allergen Reactions  . Wellbutrin [Bupropion]     Patient reports made " deathly sick " years ago at appointment 03/18/20.  . Lasix [Furosemide] Other (See Comments)    Electrolyte imbalance    Metabolic Disorder Labs: Lab Results  Component Value Date   HGBA1C 5.1 08/26/2020   MPG 108 04/08/2019   MPG 103 08/16/2017   No results found for: PROLACTIN Lab Results  Component Value Date   CHOL 143 08/21/2019   TRIG 83 08/21/2019   HDL 47 (L) 08/21/2019   CHOLHDL 3.0 08/21/2019   VLDL 27 10/14/2016   LDLCALC 79 08/21/2019   LDLCALC 79 04/08/2019   Lab Results  Component Value Date   TSH 1.09 10/16/2019   TSH 1.430 07/26/2018    Therapeutic Level Labs: No results found for: LITHIUM No results found for: VALPROATE No components found for:  CBMZ  Current Medications: Current Outpatient Medications  Medication Sig Dispense Refill  . Lurasidone HCl 60 MG TABS Take 1 tablet (60 mg total) by mouth daily with supper. 30 tablet 1  . ACCU-CHEK GUIDE test strip TEST UP TO 4 TIMES A DAY 100 each 0  . Accu-Chek Softclix Lancets lancets TEST 4 TIMES A DAY 100 each 1  . albuterol (VENTOLIN HFA) 108 (90 Base) MCG/ACT inhaler  Inhale 2 puffs into the lungs every 4 (four) hours as needed for wheezing or shortness of breath. 18 g 2  . atenolol (TENORMIN) 25 MG tablet TAKE 1 TABLET (25 MG TOTAL) BY MOUTH DAILY. 90 tablet 3  . atorvastatin (LIPITOR) 10 MG tablet TAKE 1 TABLET (10 MG TOTAL) BY MOUTH AT BEDTIME. 90 tablet 3  . BD PEN NEEDLE NANO U/F 32G X 4 MM MISC USE WITH  OZEMPIC 100 each 0  . blood glucose meter kit and supplies Dispense based on patient and insurance preference. Use up to four times daily as directed. (FOR ICD-10 E10.9, E11.9). 1 each 0  . clindamycin (CLEOCIN T) 1 % external solution Apply to aa's scalp QD PRN 60 mL 3  . clotrimazole (LOTRIMIN) 1 % cream Apply 1 application topically 2 (two) times daily. 30 g 0  . cyclobenzaprine (FLEXERIL) 5 MG tablet Take 5 mg by mouth 2 (two) times daily as needed for muscle spasms.     . DULoxetine (CYMBALTA) 60 MG capsule Take 1 capsule (60 mg total) by mouth 2 (two) times daily. 180 capsule 3  . Fluticasone-Umeclidin-Vilant (TRELEGY ELLIPTA) 100-62.5-25 MCG/INH AEPB Inhale 1 puff into the lungs daily. 28 each 11  . gabapentin (NEURONTIN) 300 MG capsule Take 600 mg by mouth 2 (two) times daily.     . hydrochlorothiazide (HYDRODIURIL) 25 MG tablet TAKE 1 TABLET (25 MG TOTAL) BY MOUTH DAILY. 90 tablet 0  . HYDROcodone-acetaminophen (NORCO/VICODIN) 5-325 MG tablet 1 po qday prn.    . hydrOXYzine (ATARAX/VISTARIL) 50 MG tablet Take 1 tablet (50 mg total) by mouth every 8 (eight) hours as needed (may cause drowsiness.). 90 tablet 0  . ketoconazole (NIZORAL) 2 % shampoo SHAMPOO INTO THE SCALP AND LET SIT'10 MINUTES THEN WASH OUT, USE 3D/WK 120 mL 2  . Lancet Devices (TRUEDRAW LANCING DEVICE) MISC     . losartan (COZAAR) 100 MG tablet TAKE 1 TABLET (100 MG TOTAL) BY MOUTH DAILY. 90 tablet 3  . meloxicam (MOBIC) 15 MG tablet Take by mouth.    . nicotine (NICODERM CQ - DOSED IN MG/24 HOURS) 14 mg/24hr patch PLACE 1 PATCH (14 MG TOTAL) ONTO THE SKIN DAILY. 30 patch 1  . omeprazole (PRILOSEC) 40 MG capsule Take 1 capsule (40 mg total) by mouth daily. 90 capsule 3  . Semaglutide,0.25 or 0.5MG/DOS, (OZEMPIC, 0.25 OR 0.5 MG/DOSE,) 2 MG/1.5ML SOPN Inject 0.5 mg into the skin once a week for 12 doses. 1.5 mL 2  . umeclidinium-vilanterol (ANORO ELLIPTA) 62.5-25 MCG/INH AEPB Inhale 1 puff into the lungs daily. 60 each 5  .  vitamin B-12 (CYANOCOBALAMIN) 1000 MCG tablet Take 1,000 mcg by mouth daily.     . Vitamin D, Ergocalciferol, (DRISDOL) 1.25 MG (50000 UNIT) CAPS capsule TAKE 1 CAPSULE BY MOUTH EVERY SEVEN DAYS. RECHECK LAB 1-2 WEEKS AFTER COMPLETION 12 capsule 0   No current facility-administered medications for this visit.     Musculoskeletal: Strength & Muscle Tone: UTA Gait & Station: UTA Patient leans: N/A  Psychiatric Specialty Exam: Review of Systems  Constitutional: Positive for fatigue.  Psychiatric/Behavioral: Positive for dysphoric mood and sleep disturbance.  All other systems reviewed and are negative.   There were no vitals taken for this visit.There is no height or weight on file to calculate BMI.  General Appearance: Casual  Eye Contact:  Fair  Speech:  Clear and Coherent  Volume:  Normal  Mood:  Dysphoric Improving  Affect:  Appropriate  Thought Process:  Goal Directed and  Descriptions of Associations: Intact  Orientation:  Full (Time, Place, and Person)  Thought Content: Logical   Suicidal Thoughts:  No  Homicidal Thoughts:  No  Memory:  Immediate;   Fair Recent;   Fair Remote;   Fair  Judgement:  Fair  Insight:  Fair  Psychomotor Activity:  Normal  Concentration:  Concentration: Fair and Attention Span: Fair  Recall:  [employer] of Knowledge: Fair  Language: Fair  Akathisia:  No  Handed:  Right  AIMS (if indicated): UTA  Assets:  Communication Skills Desire for Improvement Housing Social Support  ADL's:  Intact  Cognition: WNL  Sleep:  Restless   Screenings: GAD-7   Flowsheet Row Office Visit from 11/06/2019 in [facility] Office Visit from 09/06/2018 in [facility]  Total GAD-7 Score 21 14    PHQ2-9   Flowsheet Row Video Visit from 08/11/2020 in [facility] Video Visit from 07/03/2020 in [facility] Office Visit from 05/28/2020 in [facility] from 05/27/2020 in [facility] Visit from 11/06/2019 in [facility]  PHQ-2 Total Score _0 PHQ-9 Total Score 10 -- 6 -- 13    Flowsheet Row Video Visit from 08/11/2020 in [facility] Video Visit from 07/03/2020 in [facility] Low Risk Error: Q6 is Yes, you must answer 7       Assessment and Plan: Susan [name] is a 60 year old Caucasian female who has a history of bipolar disorder, degenerative disc disease, multiple medical problems was evaluated by telemedicine today.  Patient is currently struggling with possible adverse side effects to her medication.  Plan as noted below.  Plan Bipolar disorder type I depressed in partial remission Reduce Latuda to 60 mg p.o. daily with supper for side effects of fatigue. Cymbalta as prescribed  PTSD-stable Cymbalta 60 mg p.o. daily Continue CBT with therapist-Mr. Mostafa. Hydroxyzine 25 mg p.o. 3 times daily as needed  Tobacco use disorder-improving Provided counseling  Alcohol use disorder in remission Continue AA meetings  Follow-up in clinic in 6 weeks or sooner if needed.  This note was generated in part or whole with voice recognition software. Voice recognition is usually quite accurate but there are transcription errors that can and very often do occur. I apologize for any typographical errors that were not detected and corrected.      Ursula Alert, MD 10/09/2020, 12:27 PM

## 2020-10-26 ENCOUNTER — Other Ambulatory Visit: Payer: Self-pay | Admitting: Adult Health

## 2020-10-26 DIAGNOSIS — F3289 Other specified depressive episodes: Secondary | ICD-10-CM

## 2020-10-26 DIAGNOSIS — K219 Gastro-esophageal reflux disease without esophagitis: Secondary | ICD-10-CM

## 2020-11-02 ENCOUNTER — Telehealth: Payer: Self-pay | Admitting: Family

## 2020-11-02 ENCOUNTER — Telehealth: Payer: Self-pay | Admitting: Adult Health

## 2020-11-02 ENCOUNTER — Telehealth: Payer: Self-pay

## 2020-11-02 DIAGNOSIS — F3289 Other specified depressive episodes: Secondary | ICD-10-CM

## 2020-11-02 MED ORDER — DULOXETINE HCL 60 MG PO CPEP: 60.0000 mg | ORAL_CAPSULE | Freq: Two times a day (BID) | ORAL | 0 refills | Status: DC

## 2020-11-02 NOTE — Telephone Encounter (Signed)
Medication never been filled by our office.   Pt has transferred to Laverna Peace, NP.   Refilled: 08/21/2019 Last OV: 08/26/2020 Next OV: 11/25/2020

## 2020-11-02 NOTE — Telephone Encounter (Signed)
Spoke with pt and she stated that she is still following with Dr. Shea Evans. Pt was advised that since he refills that medication she should call their office to request the refill. Pt gave a verbal understanding.

## 2020-11-02 NOTE — Telephone Encounter (Signed)
PT called to advise that they are currently out of their DULoxetine (CYMBALTA) 60 MG capsule and are needing a refill asap. They stated that Sharyn Lull is not the PCP who filled it but she is the one who took over for the PCP that did. She would like a refill asap till she can be next be seen with Laverna Peace.

## 2020-11-02 NOTE — Telephone Encounter (Signed)
See telephone encounter.

## 2020-11-02 NOTE — Telephone Encounter (Signed)
LMTCB

## 2020-11-02 NOTE — Telephone Encounter (Signed)
Pt claims that she is taking Cymbalta 60 mg BID for many years and not once daily. Rx sent

## 2020-11-02 NOTE — Telephone Encounter (Signed)
Call out to patient

## 2020-11-02 NOTE — Telephone Encounter (Signed)
Call pt Rec'ed refill request for cymbalta 60mg  bid Appears that this is prescribed from dr Shea Evans psychiatry for 60mg  qd  Please clarify dose and also ask her to ask Dr Shea Evans to refill

## 2020-11-02 NOTE — Telephone Encounter (Signed)
pt called states that she needs refills on her cymbalta. she is out and needs it sent today. Dr. Shea Evans took over the medication but she had enough until this weekend

## 2020-11-03 ENCOUNTER — Encounter: Payer: Self-pay | Admitting: Adult Health

## 2020-11-03 ENCOUNTER — Ambulatory Visit: Payer: Medicare Other | Admitting: Psychology

## 2020-11-05 ENCOUNTER — Other Ambulatory Visit: Payer: Self-pay | Admitting: Adult Health

## 2020-11-18 ENCOUNTER — Telehealth: Payer: Medicare Other | Admitting: Psychiatry

## 2020-11-25 ENCOUNTER — Ambulatory Visit: Payer: Medicare Other | Admitting: Adult Health

## 2020-11-26 ENCOUNTER — Other Ambulatory Visit: Payer: Self-pay | Admitting: Adult Health

## 2020-11-30 ENCOUNTER — Other Ambulatory Visit: Payer: Self-pay | Admitting: Psychiatry

## 2020-11-30 DIAGNOSIS — F3131 Bipolar disorder, current episode depressed, mild: Secondary | ICD-10-CM

## 2020-12-03 ENCOUNTER — Telehealth (INDEPENDENT_AMBULATORY_CARE_PROVIDER_SITE_OTHER): Payer: Medicare Other | Admitting: Psychiatry

## 2020-12-03 ENCOUNTER — Other Ambulatory Visit: Payer: Self-pay

## 2020-12-03 ENCOUNTER — Encounter: Payer: Self-pay | Admitting: Psychiatry

## 2020-12-03 DIAGNOSIS — F3175 Bipolar disorder, in partial remission, most recent episode depressed: Secondary | ICD-10-CM

## 2020-12-03 DIAGNOSIS — F1421 Cocaine dependence, in remission: Secondary | ICD-10-CM

## 2020-12-03 DIAGNOSIS — F1021 Alcohol dependence, in remission: Secondary | ICD-10-CM

## 2020-12-03 DIAGNOSIS — F4310 Post-traumatic stress disorder, unspecified: Secondary | ICD-10-CM

## 2020-12-03 DIAGNOSIS — F17200 Nicotine dependence, unspecified, uncomplicated: Secondary | ICD-10-CM

## 2020-12-03 DIAGNOSIS — F3131 Bipolar disorder, current episode depressed, mild: Secondary | ICD-10-CM

## 2020-12-03 NOTE — Progress Notes (Signed)
Virtual Visit via Video Note  I connected with Susan [name] on 12/03/20 at  8:30 AM EDT by a video enabled telemedicine application and verified that I am speaking with the correct person using two identifiers.  Location Provider Location : [facility] Patient Location : Home  Participants: Patient , Provider   I discussed the limitations of evaluation and management by telemedicine and the availability of in person appointments. The patient expressed understanding and agreed to proceed.    I discussed the assessment and treatment plan with the patient. The patient was provided an opportunity to ask questions and all were answered. The patient agreed with the plan and demonstrated an understanding of the instructions.   The patient was advised to call back or seek an in-person evaluation if the symptoms worsen or if the condition fails to improve as anticipated.   [place] MD OP Progress Note  12/03/2020 8:58 AM Susan [name]  MRN:  [id number]  Chief Complaint:  Chief Complaint   Follow-up; Depression; Insomnia    HPI: Susan [name] is a 61 year old Caucasian female, widowed, disabled, lives in [place], has a history of bipolar disorder, alcohol and cocaine use disorder in remission, tobacco use disorder, PTSD, bereavement, degenerative disc disease, obstructive sleep apnea on CPAP, history of total hip replacement, osteoarthritis, hepatitis C was evaluated by telemedicine today.  Patient today reports she had a good vacation with her brother.  She reports she enjoyed her trip.  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  She however reports her back could not take it and hence she had to quit after 3 weeks.  She continues to attend AA meetings at least 6 times a week.  She reports she continues to be involved and enjoys it.  She continues to stay away from alcohol.  Patient reports since reducing the dosage of Latuda her fatigue  has improved.  Her depression is getting better.  She reports sleep is restless and she has to wake up several times at night to urinate due to being on hydrochlorothiazide.  She is planning to talk to her provider about that.  She denies any suicidality, homicidality or perceptual disturbances.  She is trying to cut back on smoking cigarettes.  She does have nicotine patches available.  She has been watching her diet, taking high-protein low-carb high-fiber low sugar diet.  She reports she has lost 42 pounds since the past 3 to 4 months.  She denies any other concerns today.  Visit Diagnosis:    ICD-10-CM   1. Bipolar disorder, in partial remission, most recent episode depressed ([place])  F31.75     2. PTSD (post-traumatic stress disorder)  F43.10     3. Tobacco use disorder  F17.200     4. Alcohol use disorder, severe, in sustained remission ([place])  F10.21     5. Cocaine use disorder, moderate, in sustained remission ([place])  F14.21       Past Psychiatric History: Reviewed past psychiatric history from progress note on 09/27/2019.  Past trials of Seroquel, Cymbalta, Abilify, risperidone  Past Medical History:  Past Medical History:  Diagnosis Date   ADD (attention deficit disorder)    Alcohol abuse    Anemia    Anxiety    Aortic atherosclerosis ([place]) 02/20/2018   Chest CT Sept 2019   Arthritis    rheumatoid arthritis   Asthma    Bipolar disorder ([place])    Centrilobular emphysema ([place]) 02/20/2018  Chest CT Sept 2019   Constipation    Degenerative disc disease at L5-S1 level 09/28/2016   See ortho note May 2018   Depression    bipolar, hx of suicide attempt   Drug use    Dyspnea    with exertion   GERD (gastroesophageal reflux disease)    H/O suicide attempt    slit wrists   Hepatitis C 06/26/2014   Hip pain    History of alcohol abuse    History of diverticulitis 2013   History of hepatitis C    HEP "C"--three years ago   History of MRSA infection 2013   HLD  (hyperlipidemia)    Hypertension    Hypothyroidism    Incisional hernia 11/08/2012   Knee pain    MRSA carrier Nov 2013   Multinodular thyroid    OSA (obstructive sleep apnea)    managed by Dr. Manuella Ghazi   Osteoporosis    Post-traumatic osteoarthritis of right knee 09/24/2015   Prediabetes    Recurrent ventral hernia 11/08/2012   Sleep apnea     Dx 3 years ago. Use C-PAP   Status post total right knee replacement using cement 05/10/2016   Thyroid disease    Goiter   Vitamin B12 deficiency    Vitamin D deficiency disease     Past Surgical History:  Procedure Laterality Date   BILATERAL SALPINGOOPHORECTOMY     due to abnormal mass   BREAST BIOPSY Left    neg   BREAST SURGERY Left 20 yrs ago   CESAREAN SECTION     COLONOSCOPY     COLONOSCOPY WITH PROPOFOL N/A 10/16/2017   Procedure: COLONOSCOPY WITH PROPOFOL;  Surgeon: Jonathon Bellows, MD;  Location[facility] ENDOSCOPY;  Service: Gastroenterology;  Laterality: N/A;   HERNIA REPAIR  06/2011, July 2014   Ventral wall repair with Physiomesh   HERNIA REPAIR     2nd.vental wall repair   JOINT REPLACEMENT Right    knee   TONSILLECTOMY     TOTAL HIP ARTHROPLASTY Left 07/23/2019   Procedure: TOTAL HIP ARTHROPLASTY;  Surgeon: Corky Mull, MD;  Location: [facility] ORS;  Service: Orthopedics;  Laterality: Left;   TOTAL KNEE ARTHROPLASTY Right 05/10/2016   Procedure: TOTAL KNEE ARTHROPLASTY;  Surgeon: Corky Mull, MD;  Location: [facility] ORS;  Service: Orthopedics;  Laterality: Right;   TUBAL LIGATION      Family Psychiatric History: Reviewed family psychiatric history from progress note on 09/27/2019  Family History:  Family History  Problem Relation Age of Onset   Alcohol abuse Father    Heart attack Father    Arthritis Mother    Asthma Mother    Mental illness Mother    Thyroid disease Mother    COPD Mother    Heart disease Mother    Congestive Heart Failure Mother    Alcohol abuse Mother    Eating disorder Mother    Bipolar disorder Mother     Arthritis Brother    Mental illness Brother    Cancer Brother        non-hodkins lymphoma   Alcohol abuse Sister    Drug abuse Sister    Mental illness Sister    Mental illness Sister    Fibromyalgia Sister    Obesity Sister    Pneumonia Sister    Depression Sister    Mental illness Sister    Alcohol abuse Sister    Drug abuse Sister    Drug abuse Son    Alcohol abuse  Son    Drug abuse Son    Alcohol abuse Son    Drug abuse Daughter    Diabetes Neg Hx    Stroke Neg Hx    Breast cancer Neg Hx     Social History: Reviewed social history from progress note on 09/27/2019 Social History   Socioeconomic History   Marital status: Widowed    Spouse name: Not on file   Number of children: 3   Years of education: Not on file   Highest education level: Some college, no degree  Occupational History   Occupation: disabled  Tobacco Use   Smoking status: Every Day    Packs/day: 0.50    Years: 41.00    Pack years: 20.50    Types: Cigarettes   Smokeless tobacco: Never   Tobacco comments:    REPORTED REDUCED TO HALF PACK, REPORTED  09/02/2020  Vaping Use   Vaping Use: Former   Quit date: 07/15/2017  Substance and Sexual Activity   Alcohol use: No    Alcohol/week: 0.0 standard drinks    Comment: sober since October 2018   Drug use: No    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   Sexual activity: Not Currently  Other Topics Concern   Not on file  Social History Narrative   Not on file   Social Determinants of Health   Financial Resource Strain: Low Risk    Difficulty of Paying Living Expenses: Not hard at all  Food Insecurity: No Food Insecurity   Worried About Charity fundraiser in the Last Year: Never true   [place] in the Last Year: Never true  Transportation Needs: No Transportation Needs   Lack of Transportation (Medical): No   Lack of Transportation (Non-Medical): No  Physical Activity: Inactive   Days of Exercise per Week: 0 days   Minutes  of Exercise per Session: 0 min  Stress: No Stress Concern Present   Feeling of Stress : Only a little  Social Connections: Moderately Isolated   Frequency of Communication with Friends and Family: More than three times a week   Frequency of Social Gatherings with Friends and Family: More than three times a week   Attends Religious Services: Never   Marine scientist or Organizations: Yes   Attends Music therapist: More than 4 times per year   Marital Status: Widowed    Allergies:  Allergies  Allergen Reactions   Wellbutrin [Bupropion]     Patient reports made " deathly sick " years ago at appointment 03/18/20.   Lasix [Furosemide] Other (See Comments)    Electrolyte imbalance    Metabolic Disorder Labs: Lab Results  Component Value Date   HGBA1C 5.1 08/26/2020   MPG 108 04/08/2019   MPG 103 08/16/2017   No results found for: PROLACTIN Lab Results  Component Value Date   CHOL 143 08/21/2019   TRIG 83 08/21/2019   HDL 47 (L) 08/21/2019   CHOLHDL 3.0 08/21/2019   VLDL 27 10/14/2016   LDLCALC 79 08/21/2019   LDLCALC 79 04/08/2019   Lab Results  Component Value Date   TSH 1.09 10/16/2019   TSH 1.430 07/26/2018    Therapeutic Level Labs: No results found for: LITHIUM No results found for: VALPROATE No components found for:  CBMZ  Current Medications: Current Outpatient Medications  Medication Sig Dispense Refill   predniSONE (DELTASONE) 10 MG tablet 6 day taper - Take as directed     ACCU-CHEK  GUIDE test strip TEST UP TO 4 TIMES A DAY 100 each 0   Accu-Chek Softclix Lancets lancets TEST 4 TIMES A DAY 100 each 1   albuterol (VENTOLIN HFA) 108 (90 Base) MCG/ACT inhaler Inhale 2 puffs into the lungs every 4 (four) hours as needed for wheezing or shortness of breath. 18 g 2   atenolol (TENORMIN) 25 MG tablet TAKE 1 TABLET (25 MG TOTAL) BY MOUTH DAILY. 90 tablet 3   atorvastatin (LIPITOR) 10 MG tablet TAKE 1 TABLET (10 MG TOTAL) BY MOUTH AT BEDTIME.  90 tablet 3   BD PEN NEEDLE NANO U/F 32G X 4 MM MISC USE WITH OZEMPIC 100 each 0   blood glucose meter kit and supplies Dispense based on patient and insurance preference. Use up to four times daily as directed. (FOR ICD-10 E10.9, E11.9). 1 each 0   clindamycin (CLEOCIN T) 1 % external solution Apply to aa's scalp QD PRN 60 mL 3   clotrimazole (LOTRIMIN) 1 % cream Apply 1 application topically 2 (two) times daily. 30 g 0   cyclobenzaprine (FLEXERIL) 5 MG tablet Take 5 mg by mouth 2 (two) times daily as needed for muscle spasms.      DULoxetine (CYMBALTA) 60 MG capsule Take 1 capsule (60 mg total) by mouth 2 (two) times daily. 180 capsule 0   Fluticasone-Umeclidin-Vilant (TRELEGY ELLIPTA) 100-62.5-25 MCG/INH AEPB Inhale 1 puff into the lungs daily. 28 each 11   gabapentin (NEURONTIN) 300 MG capsule Take 600 mg by mouth 2 (two) times daily.      hydrochlorothiazide (HYDRODIURIL) 25 MG tablet TAKE 1 TABLET (25 MG TOTAL) BY MOUTH DAILY. 90 tablet 0   HYDROcodone-acetaminophen (NORCO/VICODIN) 5-325 MG tablet 1 po qday prn.     hydrOXYzine (ATARAX/VISTARIL) 50 MG tablet Take 1 tablet (50 mg total) by mouth every 8 (eight) hours as needed (may cause drowsiness.). 90 tablet 0   ketoconazole (NIZORAL) 2 % shampoo SHAMPOO INTO THE SCALP AND LET SIT'10 MINUTES THEN WASH OUT, USE 3D/WK 120 mL 2   Lancet Devices (TRUEDRAW LANCING DEVICE) MISC      LATUDA 60 MG TABS TAKE 1 TABLET (60 MG TOTAL) BY MOUTH DAILY WITH SUPPER. 30 tablet 1   losartan (COZAAR) 100 MG tablet TAKE 1 TABLET (100 MG TOTAL) BY MOUTH DAILY. 90 tablet 3   meloxicam (MOBIC) 15 MG tablet Take by mouth.     nicotine (NICODERM CQ - DOSED IN MG/24 HOURS) 14 mg/24hr patch PLACE 1 PATCH (14 MG TOTAL) ONTO THE SKIN DAILY. 30 patch 1   omeprazole (PRILOSEC) 40 MG capsule Take 1 capsule (40 mg total) by mouth daily. 90 capsule 3   predniSONE (STERAPRED UNI-PAK 21 TAB) 10 MG (21) TBPK tablet Take by mouth.     umeclidinium-vilanterol (ANORO ELLIPTA)  62.5-25 MCG/INH AEPB Inhale 1 puff into the lungs daily. 60 each 5   vitamin B-12 (CYANOCOBALAMIN) 1000 MCG tablet Take 1,000 mcg by mouth daily.      Vitamin D, Ergocalciferol, (DRISDOL) 1.25 MG (50000 UNIT) CAPS capsule TAKE 1 CAPSULE BY MOUTH EVERY SEVEN DAYS. RECHECK LAB 1-2 WEEKS AFTER COMPLETION 12 capsule 0   No current facility-administered medications for this visit.     Musculoskeletal: Strength & Muscle Tone:  UTA Gait & Station:  UTA Patient leans: N/A  Psychiatric Specialty Exam: Review of Systems  Psychiatric/Behavioral:  Positive for dysphoric mood and sleep disturbance.   All other systems reviewed and are negative.  There were no vitals taken for this visit.There is  no height or weight on file to calculate BMI.  General Appearance: Casual  Eye Contact:  Good  Speech:  Clear and Coherent  Volume:  Normal  Mood:  Depressed improving  Affect:  Appropriate  Thought Process:  Goal Directed and Descriptions of Associations: Intact  Orientation:  Full (Time, Place, and Person)  Thought Content: WDL   Suicidal Thoughts:  No  Homicidal Thoughts:  No  Memory:  Immediate;   Fair Recent;   Fair Remote;   Fair  Judgement:  Fair  Insight:  Fair  Psychomotor Activity:  Normal  Concentration:  Concentration: Fair and Attention Span: Fair  Recall:  [employer] of Knowledge: Fair  Language: Fair  Akathisia:  No  Handed:  Right  AIMS (if indicated): not done  Assets:  Communication Skills Desire for Improvement Housing Social Support  ADL's:  Intact  Cognition: WNL  Sleep:   Restless since she has to get up to urinate due to being on a diuretic.   Screenings: GAD-7    Flowsheet Row Office Visit from 11/06/2019 in [facility] Office Visit from 09/06/2018 in [place]  Total GAD-7 Score 21 14      PHQ2-9    Flowsheet Row Video Visit from 12/03/2020 in [place] Video Visit from 08/11/2020 in  [place] Video Visit from 07/03/2020 in [place] Office Visit from 05/28/2020 in [place] from 05/27/2020 in [place]  PHQ-2 Total Score '1 3 1 1 1  ' PHQ-9 Total Score 7 10 -- 6 --      Flowsheet Row Video Visit from 08/11/2020 in [place] Video Visit from 07/03/2020 in [place] Low Risk Error: Q6 is Yes, you must answer 7        Assessment and Plan: Susan [name] is a 60 year old Caucasian female who has a history of bipolar disorder, degenerative disc disease, multiple medical problems was evaluated by telemedicine today.  Patient is currently making progress with regards to her depression.  Discussed plan as noted below.  Plan Bipolar disorder type I depressed in partial remission Latuda 60 mg p.o. daily. Dose reduced due to side effect of fatigue Cymbalta as prescribed  PTSD-stable Cymbalta 60 mg p.o. daily Continue CBT with therapist-Mr. Mostafa Hydroxyzine 25 mg p.o. 3 times daily as needed  Tobacco use disorder-improving Provided counseling for 3 minutes. Patient has nicotine patches available, encouraged to use it.  Alcohol use disorder in remission Continue AA meetings  Follow-up in clinic in 2 months in office.  This note was generated in part or whole with voice recognition software. Voice recognition is usually quite accurate but there are transcription errors that can and very often do occur. I apologize for any typographical errors that were not detected and corrected.          Ursula Alert, MD 12/04/2020, 5:12 PM

## 2020-12-17 NOTE — Telephone Encounter (Signed)
Error

## 2020-12-17 NOTE — Telephone Encounter (Signed)
Signing off on encounter

## 2020-12-22 ENCOUNTER — Other Ambulatory Visit: Payer: Self-pay | Admitting: Family Medicine

## 2020-12-22 ENCOUNTER — Other Ambulatory Visit: Payer: Self-pay | Admitting: Adult Health

## 2020-12-22 DIAGNOSIS — I10 Essential (primary) hypertension: Secondary | ICD-10-CM

## 2020-12-22 NOTE — Telephone Encounter (Signed)
Requested medication (s) are due for refill today: yes   Requested medication (s) are on the active medication list:  yes  Last refill:  09/29/2020  Future visit scheduled:  no  Notes to clinic:  Failed protocol:  Cr in normal range and within 360 days   Last BP in normal range     Requested Prescriptions  Pending Prescriptions Disp Refills   hydrochlorothiazide (HYDRODIURIL) 25 MG tablet [Pharmacy Med Name: hydroCHLOROthiazide '25MG'$  TABS*] 90 tablet 0    Sig: TAKE 1 TABLET (25 MG TOTAL) BY MOUTH DAILY.     Cardiovascular: Diuretics - Thiazide Failed - 12/22/2020 10:32 AM      Failed - Cr in normal range and within 360 days    Creat  Date Value Ref Range Status  10/16/2019 0.92 0.50 - 1.05 mg/dL Final    Comment:    For patients >47 years of age, the reference limit for Creatinine is approximately 13% higher for people identified as African-American. .    Creatinine, Ser  Date Value Ref Range Status  05/28/2020 1.10 (H) 0.57 - 1.00 mg/dL Final   Creatinine, Urine  Date Value Ref Range Status  10/16/2019 213 20 - 275 mg/dL Final          Failed - Last BP in normal range    BP Readings from Last 1 Encounters:  08/26/20 (!) 141/80          Passed - Ca in normal range and within 360 days    Calcium  Date Value Ref Range Status  05/28/2020 9.3 8.7 - 10.2 mg/dL Final   Calcium, Total  Date Value Ref Range Status  04/29/2014 9.1 8.5 - 10.1 mg/dL Final          Passed - K in normal range and within 360 days    Potassium  Date Value Ref Range Status  05/28/2020 3.8 3.5 - 5.2 mmol/L Final  04/29/2014 4.2 3.5 - 5.1 mmol/L Final          Passed - Na in normal range and within 360 days    Sodium  Date Value Ref Range Status  05/28/2020 137 134 - 144 mmol/L Final  04/29/2014 136 136 - 145 mmol/L Final          Passed - Valid encounter within last 6 months    Recent Outpatient Visits           3 months ago Type 2 diabetes mellitus without complication,  without long-term current use of insulin ([place])   [place], Kelby Aline, FNP   6 months ago Type 2 diabetes mellitus without complication, without long-term current use of insulin ([place])   [place], Kelby Aline, FNP   9 months ago Weight gain   [facility], Kelby Aline, FNP   11 months ago Essential hypertension   [facility], Kelby Aline, FNP   12 months ago Essential hypertension   [place], [place], [place]

## 2021-01-21 ENCOUNTER — Other Ambulatory Visit: Payer: Self-pay | Admitting: Psychiatry

## 2021-01-21 ENCOUNTER — Other Ambulatory Visit: Payer: Self-pay | Admitting: Child and Adolescent Psychiatry

## 2021-01-21 DIAGNOSIS — F3131 Bipolar disorder, current episode depressed, mild: Secondary | ICD-10-CM

## 2021-01-21 DIAGNOSIS — F3289 Other specified depressive episodes: Secondary | ICD-10-CM

## 2021-01-21 NOTE — Telephone Encounter (Signed)
Dr. Eappen's pt

## 2021-02-03 ENCOUNTER — Encounter: Payer: Self-pay | Admitting: Family

## 2021-02-03 ENCOUNTER — Other Ambulatory Visit: Payer: Self-pay

## 2021-02-03 ENCOUNTER — Ambulatory Visit (INDEPENDENT_AMBULATORY_CARE_PROVIDER_SITE_OTHER): Payer: Medicare Other | Admitting: Family

## 2021-02-03 VITALS — BP 172/92 | HR 74 | Temp 97.9°F | Ht 61.0 in | Wt 214.0 lb

## 2021-02-03 DIAGNOSIS — Z23 Encounter for immunization: Secondary | ICD-10-CM

## 2021-02-03 DIAGNOSIS — Z1231 Encounter for screening mammogram for malignant neoplasm of breast: Secondary | ICD-10-CM | POA: Diagnosis not present

## 2021-02-03 DIAGNOSIS — I10 Essential (primary) hypertension: Secondary | ICD-10-CM | POA: Diagnosis not present

## 2021-02-03 DIAGNOSIS — Z6841 Body Mass Index (BMI) 40.0 and over, adult: Secondary | ICD-10-CM

## 2021-02-03 DIAGNOSIS — Z1211 Encounter for screening for malignant neoplasm of colon: Secondary | ICD-10-CM | POA: Diagnosis not present

## 2021-02-03 DIAGNOSIS — R7303 Prediabetes: Secondary | ICD-10-CM | POA: Diagnosis not present

## 2021-02-03 DIAGNOSIS — K219 Gastro-esophageal reflux disease without esophagitis: Secondary | ICD-10-CM

## 2021-02-03 DIAGNOSIS — I700 Atherosclerosis of aorta: Secondary | ICD-10-CM

## 2021-02-03 MED ORDER — OMEPRAZOLE 20 MG PO CPDR
20.0000 mg | DELAYED_RELEASE_CAPSULE | Freq: Every day | ORAL | 1 refills | Status: DC | PRN
Start: 2021-02-03 — End: 2021-06-30

## 2021-02-03 MED ORDER — HYDROCHLOROTHIAZIDE 25 MG PO TABS: 25.0000 mg | ORAL_TABLET | Freq: Every day | ORAL | 1 refills | Status: DC

## 2021-02-03 MED ORDER — OZEMPIC (0.25 OR 0.5 MG/DOSE) 2 MG/1.5ML ~~LOC~~ SOPN: 0.5000 mg | PEN_INJECTOR | SUBCUTANEOUS | 2 refills | Status: DC

## 2021-02-03 NOTE — Assessment & Plan Note (Signed)
Controlled on OTC pepcid ac.  No alarm features at this time. counseled on long-term risk of PPIs, omeprazole.  Advised her to use omeprazole for flareups, episodic.  She verbalized understanding of all.

## 2021-02-03 NOTE — Patient Instructions (Addendum)
Please resume hydrochlorothiazide 25 mg.  If you develop chest pain, shortness of breath, headache or vision changes, please call 911 or report to the emergency room right away.    It is imperative that you are seen AT least twice per year for labs and monitoring. Monitor blood pressure at home and me 5-6 reading on separate days. Goal is less than 120/80, based on newest guidelines, however we certainly want to be less than 130/80;  if persistently higher, please make sooner follow up appointment so we can recheck you blood pressure and manage/ adjust medications.  Please call  and schedule your 3D mammogram and /or bone density scan as we discussed.   Belva  [place], [place]  I placed referral for colonoscopy.   Let us know if you dont hear back within a week in regards to an appointment being scheduled.    Long term use beyond 3 months of proton pump inhibitors ( PPI's) include Nexium, Prilosec, Protonix, Dexilant, and Prevacid.  Some medical studies have identified an association between the long-term use of PPIs and the development of numerous adverse conditions including intestinal infections, pneumonia, stomach cancer, osteoporosis-related bone fractures, chronic kidney disease, deficiencies of certain vitamins and minerals, heart attacks, strokes, dementia, and early death. Those studies have flaws, are not considered definitive, and do not establish a cause-and-effect relationship between PPIs and the adverse conditions. High-quality studies have found that PPIs do not significantly increase the risk of any of these conditions except intestinal infections. Nevertheless, we cannot exclude the possibility that PPIs might confer a small increase in the risk of developing these adverse conditions. For the treatment of GERD, gastroenterologists generally agree that the well-established benefits of PPIs far outweigh their theoretical  risks.  Nonetheless I recommend trial wean off of PPI's  if uncomplicated acid reflux and use of histamine 2 blocker ( Pepcid AC).   Patients with history of esophagitis  or Barrett's esophagus should remain on PPI.   I generally recommend trying to control acid reflux with lifestyle modifications including avoiding trigger foods, not eating 2 hours prior to bedtime. You may use histamine 2 blockers daily to twice daily ( this is Pepcid) and then when symptoms flare, start back on PPI for short course.   Of note, we will need to do an endoscopy ( upper GI) to evaluate your esophagus, stomach in the future if acid reflux persists are you develop red flag symptoms: trouble swallowing, hoarseness, chronic cough, unexplained weight loss.  Managing Your Hypertension Hypertension, also called high blood pressure, is when the force of the blood pressing against the walls of the arteries is too strong. Arteries are blood vessels that carry blood from your heart throughout your body. Hypertension forces the heart to work harder to pump blood and may cause the arteries to become narrow or stiff. Understanding blood pressure readings Your personal target blood pressure may vary depending on your medical conditions, your age, and other factors. A blood pressure reading includes a higher number over a lower number. Ideally, your blood pressure should be below 120/80. You should know that: The first, or top, number is called the systolic pressure. It is a measure of the pressure in your arteries as your heart beats. The second, or bottom number, is called the diastolic pressure. It is a measure of the pressure in your arteries as the heart relaxes. Blood pressure is classified into four stages. Based on your blood  pressure reading, your health care provider may use the following stages to determine what type of treatment you need, if any. Systolic pressure and diastolic pressure are measured in a unit called  mmHg. Normal Systolic pressure: below 102. Diastolic pressure: below 80. Elevated Systolic pressure: 725-366. Diastolic pressure: below 80. Hypertension stage 1 Systolic pressure: 440-347. Diastolic pressure: 42-59. Hypertension stage 2 Systolic pressure: 563 or above. Diastolic pressure: 90 or above. How can this condition affect me? Managing your hypertension is an important responsibility. Over time, hypertension can damage the arteries and decrease blood flow to important parts of the body, including the brain, heart, and kidneys. Having untreated or uncontrolled hypertension can lead to: A heart attack. A stroke. A weakened blood vessel (aneurysm). Heart failure. Kidney damage. Eye damage. Metabolic syndrome. Memory and concentration problems. Vascular dementia. What actions can I take to manage this condition? Hypertension can be managed by making lifestyle changes and possibly by taking medicines. Your health care provider will help you make a plan to bring your blood pressure within a normal range. Nutrition  Eat a diet that is high in fiber and potassium, and low in salt (sodium), added sugar, and fat. An example eating plan is called the Dietary Approaches to Stop Hypertension (DASH) diet. To eat this way: Eat plenty of fresh fruits and vegetables. Try to fill one-half of your plate at each meal with fruits and vegetables. Eat whole grains, such as whole-wheat pasta, brown rice, or whole-grain bread. Fill about one-fourth of your plate with whole grains. Eat low-fat dairy products. Avoid fatty cuts of meat, processed or cured meats, and poultry with skin. Fill about one-fourth of your plate with lean proteins such as fish, chicken without skin, beans, eggs, and tofu. Avoid pre-made and processed foods. These tend to be higher in sodium, added sugar, and fat. Reduce your daily sodium intake. Most people with hypertension should eat less than 1,500 mg of sodium a  day. Lifestyle  Work with your health care provider to maintain a healthy body weight or to lose weight. Ask what an ideal weight is for you. Get at least 30 minutes of exercise that causes your heart to beat faster (aerobic exercise) most days of the week. Activities may include walking, swimming, or biking. Include exercise to strengthen your muscles (resistance exercise), such as weight lifting, as part of your weekly exercise routine. Try to do these types of exercises for 30 minutes at least 3 days a week. Do not use any products that contain nicotine or tobacco, such as cigarettes, e-cigarettes, and chewing tobacco. If you need help quitting, ask your health care provider. Control any long-term (chronic) conditions you have, such as high cholesterol or diabetes. Identify your sources of stress and find ways to manage stress. This may include meditation, deep breathing, or making time for fun activities. Alcohol use Do not drink alcohol if: Your health care provider tells you not to drink. You are pregnant, may be pregnant, or are planning to become pregnant. If you drink alcohol: Limit how much you use to: 0-1 drink a day for women. 0-2 drinks a day for men. Be aware of how much alcohol is in your drink. In the [place] one drink equals one 12 oz bottle of beer (355 mL), one 5 oz glass of wine (148 mL), or one 1 oz glass of hard liquor (44 mL). Medicines Your health care provider may prescribe medicine if lifestyle changes are not enough to get your blood pressure under control  and if: Your systolic blood pressure is 130 or higher. Your diastolic blood pressure is 80 or higher. Take medicines only as told by your health care provider. Follow the directions carefully. Blood pressure medicines must be taken as told by your health care provider. The medicine does not work as well when you skip doses. Skipping doses also puts you at risk for problems. Monitoring Before you monitor your blood  pressure: Do not smoke, drink caffeinated beverages, or exercise within 30 minutes before taking a measurement. Use the bathroom and empty your bladder (urinate). Sit quietly for at least 5 minutes before taking measurements. Monitor your blood pressure at home as told by your health care provider. To do this: Sit with your back straight and supported. Place your feet flat on the floor. Do not cross your legs. Support your arm on a flat surface, such as a table. Make sure your upper arm is at heart level. Each time you measure, take two or three readings one minute apart and record the results. You may also need to have your blood pressure checked regularly by your health care provider. General information Talk with your health care provider about your diet, exercise habits, and other lifestyle factors that may be contributing to hypertension. Review all the medicines you take with your health care provider because there may be side effects or interactions. Keep all visits as told by your health care provider. Your health care provider can help you create and adjust your plan for managing your high blood pressure. Where to find more information National Heart, Lung, and Blood Institute: https://wilson-eaton.com/ American Heart Association: www.heart.org Contact a health care provider if: You think you are having a reaction to medicines you have taken. You have repeated (recurrent) headaches. You feel dizzy. You have swelling in your ankles. You have trouble with your vision. Get help right away if: You develop a severe headache or confusion. You have unusual weakness or numbness, or you feel faint. You have severe pain in your chest or abdomen. You vomit repeatedly. You have trouble breathing. These symptoms may represent a serious problem that is an emergency. Do not wait to see if the symptoms will go away. Get medical help right away. Call your local emergency services (911 in the [place] Do  not drive yourself to the hospital. Summary Hypertension is when the force of blood pumping through your arteries is too strong. If this condition is not controlled, it may put you at risk for serious complications. Your personal target blood pressure may vary depending on your medical conditions, your age, and other factors. For most people, a normal blood pressure is less than 120/80. Hypertension is managed by lifestyle changes, medicines, or both. Lifestyle changes to help manage hypertension include losing weight, eating a healthy, low-sodium diet, exercising more, stopping smoking, and limiting alcohol. This information is not intended to replace advice given to you by your health care provider. Make sure you discuss any questions you have with your health care provider. Document Revised: 05/31/2019 Document Reviewed: 03/26/2019 Elsevier Patient Education  2022 [employer].

## 2021-02-03 NOTE — Assessment & Plan Note (Signed)
Reassuring neurologic exam.  No signs or symptoms to suggest hypertensive urgency or emergency at this time.  Patient will resume hydrochlorothiazide 25 mg daily along with atenolol 25mg  , losartan 100mg . She will monitor blood pressure closely.

## 2021-02-03 NOTE — Assessment & Plan Note (Signed)
Congratulated patient on weight loss on Ozempic.  Counseled her on black box warning as it relates to MEN, medullary thyroid cancer.  Patient will continue Ozempic 0.5 mg.  Pending A1c in the setting of prediabetes

## 2021-02-03 NOTE — Progress Notes (Signed)
Subjective:    Patient ID: Susan [name], female    DOB: March 29, 1961, 60 y.o.   MRN: [id number]  CC: Susan [name] is a 60 y.o. female who presents today for follow up.   HPI: GERD- she has run out of omeprazole 32m for a couple of years. Taking pepcid otc prn.  No trouble swallowing or pain with swallowing. Describes epigastric burning. No vomiting, CP, sob.  No h/o barrett's esophagus.     HTN- compliant with losartan, atenolol.  She has been out of hctz 247m No h/o potassium disturbances. At home 117/80.    H/o prediabetes- compliant with ozempic 0.19m26mShe missed one dose last week. She would like refill as has been happy with wieght loss, total 42lbs. No ho thyroid cancer in family or personally.  No nausea, vomiting.   HISTORY:  Past Medical History:  Diagnosis Date   ADD (attention deficit disorder)    Alcohol abuse    Anemia    Anxiety    Aortic atherosclerosis ([place]/15/[place]   Chest CT Sept 2019   Arthritis    rheumatoid arthritis   Asthma    B[place] disorder [place]  Centrilobular emphysema ([place]/15/2019   Chest CT Sept 2019   Constipation    Degenerative disc disease at L5-S1 level 09/28/2016   See ortho note May 2018   Depression    bipolar, hx of suicide attempt   Drug use    Dyspnea    with exertion   GERD (gastroesophageal reflux disease)    H/O suicide attempt    slit wrists   Hepatitis C 06/26/2014   Hip pain    History of alcohol abuse    History of diverticulitis 2013   History of hepatitis C    HEP "C"--three years ago   History of MRSA infection 2013   HLD (hyperlipidemia)    Hypertension    Hypothyroidism    Incisional hernia 11/08/2012   Knee pain    MRSA carrier Nov 2013   Multinodular thyroid    OSA (obstructive sleep apnea)    managed by Dr. ShaManuella GhaziOsteoporosis    Post-traumatic osteoarthritis of right knee 09/24/2015   Prediabetes    Recurrent ventral hernia 11/08/2012   Sleep apnea     Dx 3 years ago. Use C-PAP    Status post total right knee replacement using cement 05/10/2016   Thyroid disease    Goiter   Vitamin B12 deficiency    Vitamin D deficiency disease    Past Surgical History:  Procedure Laterality Date   BILATERAL SALPINGOOPHORECTOMY     due to abnormal mass   BREAST BIOPSY Left    neg   BREAST SURGERY Left 20 yrs ago   CESAREAN SECTION     COLONOSCOPY     COLONOSCOPY WITH PROPOFOL N/A 10/16/2017   Procedure: COLONOSCOPY WITH PROPOFOL;  Surgeon: AnnJonathon BellowsD;  [facility];  Service: Gastroenterology;  Laterality: N/A;   HERNIA REPAIR  06/2011, July 2014   Ventral wall repair with Physiomesh   HERNIA REPAIR     2nd.vental wall repair   JOINT REPLACEMENT Right    knee   TONSILLECTOMY     TOTAL HIP ARTHROPLASTY Left 07/23/2019   Procedure: TOTAL HIP ARTHROPLASTY;  Surgeon: PogCorky MullD;  Location: [facility]S;  Service: Orthopedics;  Laterality: Left;   TOTAL KNEE ARTHROPLASTY Right 05/10/2016   Procedure: TOTAL KNEE ARTHROPLASTY;  Surgeon: JohCorky MullD;  Location: [facility] ORS;  Service: Orthopedics;  Laterality: Right;   TUBAL LIGATION     Family History  Problem Relation Age of Onset   Arthritis Mother    Asthma Mother    Mental illness Mother    Thyroid disease Mother    COPD Mother    Heart disease Mother    Congestive Heart Failure Mother    Alcohol abuse Mother    Eating disorder Mother    Bipolar disorder Mother    Alcohol abuse Father    Heart attack Father    Alcohol abuse Sister    Drug abuse Sister    Mental illness Sister    Mental illness Sister    Fibromyalgia Sister    Obesity Sister    Pneumonia Sister    Depression Sister    Mental illness Sister    Alcohol abuse Sister    Drug abuse Sister    Arthritis Brother    Mental illness Brother    Cancer Brother        non-hodkins lymphoma   Drug abuse Daughter    Drug abuse Son    Alcohol abuse Son    Drug abuse Son    Alcohol abuse Son    Diabetes Neg Hx    Stroke Neg Hx     Breast cancer Neg Hx    Thyroid cancer Neg Hx     Allergies: Wellbutrin [bupropion] and Lasix [furosemide] Current Outpatient Medications on File Prior to Visit  Medication Sig Dispense Refill   ACCU-CHEK GUIDE test strip TEST UP TO 4 TIMES A DAY 100 each 0   Accu-Chek Softclix Lancets lancets TEST 4 TIMES A DAY 100 each 1   albuterol (VENTOLIN HFA) 108 (90 Base) MCG/ACT inhaler Inhale 2 puffs into the lungs every 4 (four) hours as needed for wheezing or shortness of breath. 18 g 2   atenolol (TENORMIN) 25 MG tablet TAKE 1 TABLET (25 MG TOTAL) BY MOUTH DAILY. 90 tablet 3   atorvastatin (LIPITOR) 10 MG tablet TAKE 1 TABLET (10 MG TOTAL) BY MOUTH AT BEDTIME. 90 tablet 3   BD PEN NEEDLE NANO U/F 32G X 4 MM MISC USE WITH OZEMPIC 100 each 0   blood glucose meter kit and supplies Dispense based on patient and insurance preference. Use up to four times daily as directed. (FOR ICD-10 E10.9, E11.9). 1 each 0   clindamycin (CLEOCIN T) 1 % external solution Apply to aa's scalp QD PRN 60 mL 3   clotrimazole (LOTRIMIN) 1 % cream Apply 1 application topically 2 (two) times daily. 30 g 0   cyclobenzaprine (FLEXERIL) 5 MG tablet Take 5 mg by mouth 2 (two) times daily as needed for muscle spasms.      DULoxetine (CYMBALTA) 60 MG capsule TAKE 1 CAPSULE (60 MG TOTAL) BY MOUTH 2 (TWO) TIMES DAILY. 180 capsule 0   Fluticasone-Umeclidin-Vilant (TRELEGY ELLIPTA) 100-62.5-25 MCG/INH AEPB Inhale 1 puff into the lungs daily. 28 each 11   gabapentin (NEURONTIN) 300 MG capsule Take 600 mg by mouth 2 (two) times daily.      HYDROcodone-acetaminophen (NORCO/VICODIN) 5-325 MG tablet 1 po qday prn.     hydrOXYzine (ATARAX/VISTARIL) 50 MG tablet Take 1 tablet (50 mg total) by mouth every 8 (eight) hours as needed (may cause drowsiness.). 90 tablet 0   ketoconazole (NIZORAL) 2 % shampoo SHAMPOO INTO THE SCALP AND LET SIT'10 MINUTES THEN WASH OUT, USE 3D/WK 120 mL 2   Lancet Devices (TRUEDRAW LANCING DEVICE) MISC  LATUDA  60 MG TABS TAKE 1 TABLET (60 MG TOTAL) BY MOUTH DAILY WITH SUPPER. 30 tablet 1   losartan (COZAAR) 100 MG tablet TAKE 1 TABLET (100 MG TOTAL) BY MOUTH DAILY. 90 tablet 3   meloxicam (MOBIC) 15 MG tablet Take by mouth.     vitamin B-12 (CYANOCOBALAMIN) 1000 MCG tablet Take 1,000 mcg by mouth daily.      Vitamin D, Ergocalciferol, (DRISDOL) 1.25 MG (50000 UNIT) CAPS capsule TAKE 1 CAPSULE BY MOUTH EVERY SEVEN DAYS. RECHECK LAB 1-2 WEEKS AFTER COMPLETION 12 capsule 0   omeprazole (PRILOSEC) 40 MG capsule Take 1 capsule (40 mg total) by mouth daily. 90 capsule 3   No current facility-administered medications on file prior to visit.    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  Eyes:  Negative for visual disturbance.  Respiratory:  Negative for cough.   Cardiovascular:  Negative for chest pain and palpitations.  Gastrointestinal:  Negative for nausea and vomiting.  Neurological:  Negative for dizziness and headaches.     Objective:    BP (!) 172/92 (BP Location: Left Arm, Patient Position: Sitting, Cuff Size: Normal)   Pulse 74   Temp 97.9 F (36.6 C) (Oral)   Ht '5\' 1"'  (1.549 m)   Wt 214 lb (97.1 kg)   SpO2 98%   BMI 40.43 kg/m  BP Readings from Last 3 Encounters:  02/03/21 (!) 172/92  08/26/20 (!) 141/80  05/28/20 (!) 158/81   Wt Readings from Last 3 Encounters:  02/03/21 214 lb (97.1 kg)  08/26/20 225 lb 6.4 oz (102.2 kg)  05/28/20 243 lb (110.2 kg)    Physical  Exam Vitals reviewed.  Constitutional:      Appearance: She is well-developed.  HENT:     Mouth/Throat:     Pharynx: Uvula midline.  Eyes:     Conjunctiva/sclera: Conjunctivae normal.     Pupils: Pupils are equal, round, and reactive to light.     Comments: Fundus normal bilaterally.   Cardiovascular:     Rate and Rhythm: Normal rate and regular rhythm.     Pulses: Normal pulses.     Heart sounds: Normal heart sounds.  Pulmonary:     Effort: Pulmonary effort is normal.     Breath sounds: Normal breath sounds. No wheezing, rhonchi or rales.  Skin:    General: Skin is warm and dry.  Neurological:     Mental Status: She is alert.     Cranial Nerves: No cranial nerve deficit.     Sensory: No sensory deficit.     Deep Tendon Reflexes:     Reflex Scores:      Bicep reflexes are 2+ on the right side and 2+ on the left side.      Patellar reflexes are 2+ on the right side and 2+ on the left  side.    Comments: Grip equal and strong bilateral upper extremities. Gait strong and steady. Able to perform rapid alternating movement without difficulty.   Psychiatric:        Speech: Speech normal.        Behavior: Behavior normal.        Thought Content: Thought content normal.       Assessment & Plan:   Problem List Items Addressed This Visit       Cardiovascular and Mediastinum   Aortic atherosclerosis ([place])   Relevant Medications   hydrochlorothiazide (HYDRODIURIL) 25 MG tablet   Other Relevant Orders   Lipid panel   Essential hypertension    Reassuring neurologic exam.  No signs or symptoms to suggest hypertensive urgency or emergency at this time.  Patient will resume hydrochlorothiazide 25 mg daily along with atenolol 89m , losartan 1049m She will monitor blood pressure closely.       Relevant Medications   hydrochlorothiazide (HYDRODIURIL) 25 MG tablet   Other Relevant Orders   CBC with Differential/Platelet   Comprehensive metabolic panel   Basic metabolic panel      Digestive   GERD (gastroesophageal reflux disease)    Controlled on OTC pepcid ac.  No alarm features at this time. counseled on long-term risk of PPIs, omeprazole.  Advised her to use omeprazole for flareups, episodic.  She verbalized understanding of all.      Relevant Medications   omeprazole (PRILOSEC) 20 MG capsule     Other   Class 3 severe obesity with serious comorbidity and body mass index (BMI) of 40.0 to 44.9 in adult ([name]  Relevant Medications   Semaglutide,0.25 or 0.5MG/DOS, (OZEMPIC, 0.25 OR 0.5 MG/DOSE,) 2 MG/1.5ML SOPN   Encounter for screening mammogram for malignant neoplasm of breast - Primary   Relevant Orders   MM 3D SCREEN BREAST BILATERAL   Morbid obesity ([place]   Congratulated patient on weight loss on Ozempic.  Counseled her on black box warning as it relates to MEN, medullary thyroid cancer.  Patient will continue Ozempic 0.5 mg.  Pending A1c in the setting of prediabetes      Relevant Medications   Semaglutide,0.25 or 0.5MG/DOS, (OZEMPIC, 0.25 OR 0.5 MG/DOSE,) 2 MG/1.5ML SOPN   Prediabetes   Relevant Orders   Hemoglobin A1c   Other Visit Diagnoses     Screen for colon cancer       Relevant Orders   Ambulatory referral to Gastroenterology        I have discontinued Chevonne L. Leandro's Anoro Ellipta, nicotine, predniSONE, predniSONE, and Ozempic (0.25 or 0.5 MG/DOSE). I am also having her start on omeprazole and Ozempic (0.25 or 0.5 MG/DOSE). Additionally, I am having her maintain her gabapentin, cyclobenzaprine, vitamin B-12, clotrimazole, albuterol, meloxicam, clindamycin, omeprazole, HYDROcodone-acetaminophen, blood glucose meter kit and supplies, hydrOXYzine, Vitamin D (Ergocalciferol), losartan, atenolol, atorvastatin, ketoconazole, Accu-Chek Softclix Lancets, Trelegy Ellipta, TRUEdraw Lancing Device, Accu-Chek Guide, BD Pen Needle Nano U/F, DULoxetine, Latuda, and hydrochlorothiazide.   Meds ordered this encounter  Medications   omeprazole  (PRILOSEC) 20 MG capsule    Sig: Take 1 capsule (20 mg total) by mouth daily as needed. Take 30 mins to 1 hr before breakfast    Dispense:  90 capsule    Refill:  1    Order Specific Question:   Supervising Provider    Answer:   TUCrecencio [id number]]   hydrochlorothiazide (HYDRODIURIL) 25 MG tablet    Sig: Take 1 tablet (25 mg total)  by mouth daily.    Dispense:  90 tablet    Refill:  1    Maximum Refills Reached    Order Specific Question:   Supervising Provider    Answer:   Deborra Medina L [[id number]]   Semaglutide,0.25 or 0.5MG/DOS, (OZEMPIC, 0.25 OR 0.5 MG/DOSE,) 2 MG/1.5ML SOPN    Sig: Inject 0.5 mg into the skin once a week.    Dispense:  3 mL    Refill:  2    Order Specific Question:   Supervising Provider    Answer:   Crecencio Mc [[id number]]    Return precautions given.   Risks, benefits, and alternatives of the medications and treatment plan prescribed today were discussed, and patient expressed understanding.   Education regarding symptom management and diagnosis given to patient on AVS.  Continue to follow with Flinchum, Kelby Aline, FNP for routine health maintenance.   Cythnia L Kawecki and I agreed with plan.   Mable Paris, FNP

## 2021-02-04 ENCOUNTER — Other Ambulatory Visit (INDEPENDENT_AMBULATORY_CARE_PROVIDER_SITE_OTHER): Payer: Self-pay

## 2021-02-04 DIAGNOSIS — Z1211 Encounter for screening for malignant neoplasm of colon: Secondary | ICD-10-CM

## 2021-02-04 LAB — CBC WITH DIFFERENTIAL/PLATELET
Basophils Absolute: 0.1 10*3/uL (ref 0.0–0.1)
Basophils Relative: 0.8 % (ref 0.0–3.0)
Eosinophils Absolute: 0.2 10*3/uL (ref 0.0–0.7)
Eosinophils Relative: 1.9 % (ref 0.0–5.0)
HCT: 38.2 % (ref 36.0–46.0)
Hemoglobin: 12.7 g/dL (ref 12.0–15.0)
Lymphocytes Relative: 26.3 % (ref 12.0–46.0)
Lymphs Abs: 2.9 10*3/uL (ref 0.7–4.0)
MCHC: 33.2 g/dL (ref 30.0–36.0)
MCV: 97.7 fl (ref 78.0–100.0)
Monocytes Absolute: 0.8 10*3/uL (ref 0.1–1.0)
Monocytes Relative: 6.8 % (ref 3.0–12.0)
Neutro Abs: 7.2 10*3/uL (ref 1.4–7.7)
Neutrophils Relative %: 64.2 % (ref 43.0–77.0)
Platelets: 222 10*3/uL (ref 150.0–400.0)
RBC: 3.91 Mil/uL (ref 3.87–5.11)
RDW: 13.9 % (ref 11.5–15.5)
WBC: 11.2 10*3/uL — ABNORMAL HIGH (ref 4.0–10.5)

## 2021-02-04 LAB — COMPREHENSIVE METABOLIC PANEL
ALT: 15 U/L (ref 0–35)
AST: 11 U/L (ref 0–37)
Albumin: 4 g/dL (ref 3.5–5.2)
Alkaline Phosphatase: 57 U/L (ref 39–117)
BUN: 9 mg/dL (ref 6–23)
CO2: 30 mEq/L (ref 19–32)
Calcium: 8.8 mg/dL (ref 8.4–10.5)
Chloride: 103 mEq/L (ref 96–112)
Creatinine, Ser: 0.89 mg/dL (ref 0.40–1.20)
GFR: 70.66 mL/min (ref >60.00)
Glucose, Bld: 62 mg/dL — ABNORMAL LOW (ref 70–99)
Potassium: 3.4 mEq/L — ABNORMAL LOW (ref 3.5–5.1)
Sodium: 140 mEq/L (ref 135–145)
Total Bilirubin: 0.4 mg/dL (ref 0.2–1.2)
Total Protein: 6.1 g/dL (ref 6.0–8.3)

## 2021-02-04 LAB — HEMOGLOBIN A1C: Hgb A1c MFr Bld: 5.5 % (ref 4.6–6.5)

## 2021-02-04 MED ORDER — PEG 3350-KCL-NA BICARB-NACL 420 G PO SOLR: 4000.0000 mL | Freq: Once | ORAL | 0 refills | Status: AC

## 2021-02-04 NOTE — Progress Notes (Signed)
Gastroenterology Pre-Procedure Review  Request Date: 02/16/2021 Requesting Physician: Dr. Vicente Males  PATIENT REVIEW QUESTIONS: The patient responded to the following health history questions as indicated:    1. Are you having any GI issues? no 2. Do you have a personal history of Polyps? no 3. Do you have a family history of Colon Cancer or Polyps? no 4. Diabetes Mellitus? yes (TYPE 2) 5. Joint replacements in the past 12 months?no 6. Major health problems in the past 3 months?yes ([place]) 7. Any artificial heart valves, MVP, or defibrillator?no    MEDICATIONS & ALLERGIES:    Patient reports the following regarding taking any anticoagulation/antiplatelet therapy:   Plavix, Coumadin, Eliquis, Xarelto, Lovenox, Pradaxa, Brilinta, or Effient? no Aspirin? no  Patient confirms/reports the following medications:  Current Outpatient Medications  Medication Sig Dispense Refill   ACCU-CHEK GUIDE test strip TEST UP TO 4 TIMES A DAY 100 each 0   Accu-Chek Softclix Lancets lancets TEST 4 TIMES A DAY 100 each 1   albuterol (VENTOLIN HFA) 108 (90 Base) MCG/ACT inhaler Inhale 2 puffs into the lungs every 4 (four) hours as needed for wheezing or shortness of breath. 18 g 2   atenolol (TENORMIN) 25 MG tablet TAKE 1 TABLET (25 MG TOTAL) BY MOUTH DAILY. 90 tablet 3   atorvastatin (LIPITOR) 10 MG tablet TAKE 1 TABLET (10 MG TOTAL) BY MOUTH AT BEDTIME. 90 tablet 3   BD PEN NEEDLE NANO U/F 32G X 4 MM MISC USE WITH OZEMPIC 100 each 0   blood glucose meter kit and supplies Dispense based on patient and insurance preference. Use up to four times daily as directed. (FOR ICD-10 E10.9, E11.9). 1 each 0   clindamycin (CLEOCIN T) 1 % external solution Apply to aa's scalp QD PRN 60 mL 3   clotrimazole (LOTRIMIN) 1 % cream Apply 1 application topically 2 (two) times daily. 30 g 0   cyclobenzaprine (FLEXERIL) 5 MG tablet Take 5 mg by mouth 2 (two) times daily as needed for muscle spasms.      DULoxetine (CYMBALTA)  60 MG capsule TAKE 1 CAPSULE (60 MG TOTAL) BY MOUTH 2 (TWO) TIMES DAILY. 180 capsule 0   Fluticasone-Umeclidin-Vilant (TRELEGY ELLIPTA) 100-62.5-25 MCG/INH AEPB Inhale 1 puff into the lungs daily. 28 each 11   gabapentin (NEURONTIN) 300 MG capsule Take 600 mg by mouth 2 (two) times daily.      hydrochlorothiazide (HYDRODIURIL) 25 MG tablet Take 1 tablet (25 mg total) by mouth daily. 90 tablet 1   HYDROcodone-acetaminophen (NORCO/VICODIN) 5-325 MG tablet 1 po qday prn.     hydrOXYzine (ATARAX/VISTARIL) 50 MG tablet Take 1 tablet (50 mg total) by mouth every 8 (eight) hours as needed (may cause drowsiness.). 90 tablet 0   ketoconazole (NIZORAL) 2 % shampoo SHAMPOO INTO THE SCALP AND LET SIT'10 MINUTES THEN WASH OUT, USE 3D/WK 120 mL 2   Lancet Devices (TRUEDRAW LANCING DEVICE) MISC      LATUDA 60 MG TABS TAKE 1 TABLET (60 MG TOTAL) BY MOUTH DAILY WITH SUPPER. 30 tablet 1   losartan (COZAAR) 100 MG tablet TAKE 1 TABLET (100 MG TOTAL) BY MOUTH DAILY. 90 tablet 3   meloxicam (MOBIC) 15 MG tablet Take by mouth.     omeprazole (PRILOSEC) 20 MG capsule Take 1 capsule (20 mg total) by mouth daily as needed. Take 30 mins to 1 hr before breakfast 90 capsule 1   omeprazole (PRILOSEC) 40 MG capsule Take 1 capsule (40 mg total) by mouth daily. 90 capsule 3  Semaglutide,0.25 or 0.5MG/DOS, (OZEMPIC, 0.25 OR 0.5 MG/DOSE,) 2 MG/1.5ML SOPN Inject 0.5 mg into the skin once a week. 3 mL 2   vitamin B-12 (CYANOCOBALAMIN) 1000 MCG tablet Take 1,000 mcg by mouth daily.      Vitamin D, Ergocalciferol, (DRISDOL) 1.25 MG (50000 UNIT) CAPS capsule TAKE 1 CAPSULE BY MOUTH EVERY SEVEN DAYS. RECHECK LAB 1-2 WEEKS AFTER COMPLETION 12 capsule 0   No current facility-administered medications for this visit.    Patient confirms/reports the following allergies:  Allergies  Allergen Reactions   Wellbutrin [Bupropion]     Patient reports made " deathly sick " years ago at appointment 03/18/20.   Lasix [Furosemide] Other (See  Comments)    Electrolyte imbalance    No orders of the defined types were placed in this encounter.   AUTHORIZATION INFORMATION Primary Insurance: 1D#: Group #:  Secondary Insurance: 1D#: Group #:  SCHEDULE INFORMATION: Date: 02/16/2021 Time: Location: [facility]

## 2021-02-05 ENCOUNTER — Other Ambulatory Visit: Payer: Self-pay | Admitting: Family

## 2021-02-05 ENCOUNTER — Telehealth (INDEPENDENT_AMBULATORY_CARE_PROVIDER_SITE_OTHER): Payer: Medicare Other | Admitting: Psychiatry

## 2021-02-05 ENCOUNTER — Encounter: Payer: Self-pay | Admitting: Psychiatry

## 2021-02-05 ENCOUNTER — Other Ambulatory Visit: Payer: Self-pay

## 2021-02-05 DIAGNOSIS — F4310 Post-traumatic stress disorder, unspecified: Secondary | ICD-10-CM | POA: Diagnosis not present

## 2021-02-05 DIAGNOSIS — Z634 Disappearance and death of family member: Secondary | ICD-10-CM | POA: Diagnosis not present

## 2021-02-05 DIAGNOSIS — I10 Essential (primary) hypertension: Secondary | ICD-10-CM

## 2021-02-05 DIAGNOSIS — F3176 Bipolar disorder, in full remission, most recent episode depressed: Secondary | ICD-10-CM | POA: Insufficient documentation

## 2021-02-05 DIAGNOSIS — F1421 Cocaine dependence, in remission: Secondary | ICD-10-CM

## 2021-02-05 DIAGNOSIS — F1021 Alcohol dependence, in remission: Secondary | ICD-10-CM

## 2021-02-05 DIAGNOSIS — F17200 Nicotine dependence, unspecified, uncomplicated: Secondary | ICD-10-CM

## 2021-02-05 MED ORDER — HYDROXYZINE PAMOATE 25 MG PO CAPS: 25.0000 mg | ORAL_CAPSULE | Freq: Three times a day (TID) | ORAL | 1 refills | Status: DC | PRN

## 2021-02-05 MED ORDER — POTASSIUM CHLORIDE CRYS ER 10 MEQ PO TBCR: 10.0000 meq | EXTENDED_RELEASE_TABLET | ORAL | 1 refills | Status: DC

## 2021-02-05 NOTE — Progress Notes (Signed)
Virtual Visit via Video Note  I connected with Newton on 02/05/21 at  8:30 AM EDT by a video enabled telemedicine application and verified that I am speaking with the correct person using two identifiers.  Location Provider Location : [facility] Patient Location : Home  Participants: Patient , Provider   I discussed the limitations of evaluation and management by telemedicine and the availability of in person appointments. The patient expressed understanding and agreed to proceed.    I discussed the assessment and treatment plan with the patient. The patient was provided an opportunity to ask questions and all were answered. The patient agreed with the plan and demonstrated an understanding of the instructions.   The patient was advised to call back or seek an in-person evaluation if the symptoms worsen or if the condition fails to improve as anticipated.   [place] MD OP Progress Note  02/05/2021 9:53 AM SABREENA VOGAN  MRN:  [id number]  Chief Complaint:  Chief Complaint   Follow-up; Anxiety; Depression    HPI: Susan [name] is a 60 year old Caucasian female, widowed, disabled, lives in [place], has a history of bipolar disorder, alcohol and cocaine use disorder in remission, tobacco use disorder, PTSD, bereavement, degenerative disc disease, obstructive sleep apnea on CPAP, history of total hip replacement, osteoarthritis, hepatitis C was evaluated by telemedicine today.  Patient today reports her dog recently got hit by a car, and she is currently grieving.  Patient reports she is currently taking it one day at a time.  She is planning to take a trip to stay with her brother for a few days.  She reports her anxiety may be high when she tries to go to bed.  Sleep has been restless the past few days since her dog died.  She reports she wakes up in between and sleep is interrupted.  She has not been taking hydroxyzine and wonders whether it will be a good idea to start using  it.  She continues to go for [name].  She is going to celebrate 4 years of sobriety on October 18.  She reports her smoking got worse the past few weeks.  She had a roommate staying with her and she was a smoker and that did not help her much.  Patient denies any suicidality, homicidality or perceptual disturbances.  She reports she was able to schedule an appointment with her previous therapist Mr. Buena Irish.  Patient denies any other concerns today.  Visit Diagnosis:    ICD-10-CM   1. Bipolar disorder, in full remission, most recent episode depressed (HCC)  F31.76 hydrOXYzine (VISTARIL) 25 MG capsule    2. PTSD (post-traumatic stress disorder)  F43.10 hydrOXYzine (VISTARIL) 25 MG capsule    3. Tobacco use disorder  F17.200     4. Bereavement  Z63.4     5. Alcohol use disorder, severe, in sustained remission ([place])  F10.21     6. Cocaine use disorder, moderate, in sustained remission ([place])  F14.21       Past Psychiatric History: Reviewed past psychiatric history from progress note on 09/27/2019.  Past trials of Seroquel, Cymbalta, Abilify, risperidone  Past Medical History:  Past Medical History:  Diagnosis Date   ADD (attention deficit disorder)    Alcohol abuse    Anemia    Anxiety    Aortic atherosclerosis ([place]) 02/20/2018   Chest CT Sept 2019   Arthritis    rheumatoid arthritis   Asthma    Bipolar disorder ([place])  Centrilobular emphysema ([place]) 02/20/2018   Chest CT Sept 2019   Constipation    Degenerative disc disease at L5-S1 level 09/28/2016   See ortho note May 2018   Depression    bipolar, hx of suicide attempt   Drug use    Dyspnea    with exertion   GERD (gastroesophageal reflux disease)    H/O suicide attempt    slit wrists   Hepatitis C 06/26/2014   Hip pain    History of alcohol abuse    History of diverticulitis 2013   History of hepatitis C    HEP "C"--three years ago   History of MRSA infection 2013   HLD (hyperlipidemia)     Hypertension    Hypothyroidism    Incisional hernia 11/08/2012   Knee pain    MRSA carrier Nov 2013   Multinodular thyroid    OSA (obstructive sleep apnea)    managed by Dr. Manuella Ghazi   Osteoporosis    Post-traumatic osteoarthritis of right knee 09/24/2015   Prediabetes    Recurrent ventral hernia 11/08/2012   Sleep apnea     Dx 3 years ago. Use C-PAP   Status post total right knee replacement using cement 05/10/2016   Thyroid disease    Goiter   Vitamin B12 deficiency    Vitamin D deficiency disease     Past Surgical History:  Procedure Laterality Date   BILATERAL SALPINGOOPHORECTOMY     due to abnormal mass   BREAST BIOPSY Left    neg   BREAST SURGERY Left 20 yrs ago   CESAREAN SECTION     COLONOSCOPY     COLONOSCOPY WITH PROPOFOL N/A 10/16/2017   Procedure: COLONOSCOPY WITH PROPOFOL;  Surgeon: Jonathon Bellows, MD;  Location[facility] ENDOSCOPY;  Service: Gastroenterology;  Laterality: N/A;   HERNIA REPAIR  06/2011, July 2014   Ventral wall repair with Physiomesh   HERNIA REPAIR     2nd.vental wall repair   JOINT REPLACEMENT Right    knee   TONSILLECTOMY     TOTAL HIP ARTHROPLASTY Left 07/23/2019   Procedure: TOTAL HIP ARTHROPLASTY;  Surgeon: Corky Mull, MD;  Location: [facility] ORS;  Service: Orthopedics;  Laterality: Left;   TOTAL KNEE ARTHROPLASTY Right 05/10/2016   Procedure: TOTAL KNEE ARTHROPLASTY;  Surgeon: Corky Mull, MD;  Location: [facility] ORS;  Service: Orthopedics;  Laterality: Right;   TUBAL LIGATION      Family Psychiatric History: Reviewed family psychiatric history from progress note on 09/27/2019  Family History:  Family History  Problem Relation Age of Onset   Arthritis Mother    Asthma Mother    Mental illness Mother    Thyroid disease Mother    COPD Mother    Heart disease Mother    Congestive Heart Failure Mother    Alcohol abuse Mother    Eating disorder Mother    Bipolar disorder Mother    Alcohol abuse Father    Heart attack Father    Alcohol abuse Sister     Drug abuse Sister    Mental illness Sister    Mental illness Sister    Fibromyalgia Sister    Obesity Sister    Pneumonia Sister    Depression Sister    Mental illness Sister    Alcohol abuse Sister    Drug abuse Sister    Arthritis Brother    Mental illness Brother    Cancer Brother        non-hodkins lymphoma   Drug abuse  Daughter    Drug abuse Son    Alcohol abuse Son    Drug abuse Son    Alcohol abuse Son    Diabetes Neg Hx    Stroke Neg Hx    Breast cancer Neg Hx    Thyroid cancer Neg Hx     Social History: Reviewed social history from progress note on 09/27/2019 Social History   Socioeconomic History   Marital status: Widowed    Spouse name: Not on file   Number of children: 3   Years of education: Not on file   Highest education level: Some college, no degree  Occupational History   Occupation: disabled  Tobacco Use   Smoking status: Every Day    Packs/day: 0.50    Years: 41.00    Pack years: 20.50    Types: Cigarettes   Smokeless tobacco: Never   Tobacco comments:    REPORTED REDUCED TO HALF PACK, REPORTED  09/02/2020  Vaping Use   Vaping Use: Former   Quit date: 07/15/2017  Substance and Sexual Activity   Alcohol use: No    Alcohol/week: 0.0 standard drinks    Comment: sober since October 2018   Drug use: No    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   Sexual activity: Not Currently  Other Topics Concern   Not on file  Social History Narrative   Not on file   Social Determinants of Health   Financial Resource Strain: Low Risk    Difficulty of Paying Living Expenses: Not hard at all  Food Insecurity: No Food Insecurity   Worried About Charity fundraiser in the Last Year: Never true   [place] in the Last Year: Never true  Transportation Needs: No Transportation Needs   Lack of Transportation (Medical): No   Lack of Transportation (Non-Medical): No  Physical Activity: Inactive   Days of Exercise per Week: 0 days    Minutes of Exercise per Session: 0 min  Stress: No Stress Concern Present   Feeling of Stress : Only a little  Social Connections: Moderately Isolated   Frequency of Communication with Friends and Family: More than three times a week   Frequency of Social Gatherings with Friends and Family: More than three times a week   Attends Religious Services: Never   Marine scientist or Organizations: Yes   Attends Music therapist: More than 4 times per year   Marital Status: Widowed    Allergies:  Allergies  Allergen Reactions   Wellbutrin [Bupropion]     Patient reports made " deathly sick " years ago at appointment 03/18/20.   Lasix [Furosemide] Other (See Comments)    Electrolyte imbalance    Metabolic Disorder Labs: Lab Results  Component Value Date   HGBA1C 5.5 02/03/2021   MPG 108 04/08/2019   MPG 103 08/16/2017   No results found for: PROLACTIN Lab Results  Component Value Date   CHOL 143 08/21/2019   TRIG 83 08/21/2019   HDL 47 (L) 08/21/2019   CHOLHDL 3.0 08/21/2019   VLDL 27 10/14/2016   LDLCALC 79 08/21/2019   LDLCALC 79 04/08/2019   Lab Results  Component Value Date   TSH 1.09 10/16/2019   TSH 1.430 07/26/2018    Therapeutic Level Labs: No results found for: LITHIUM No results found for: VALPROATE No components found for:  CBMZ  Current Medications: Current Outpatient Medications  Medication Sig Dispense Refill   hydrOXYzine (VISTARIL) 25 MG  capsule Take 1 capsule (25 mg total) by mouth every 8 (eight) hours as needed. For anxiety, sleep 90 capsule 1   ACCU-CHEK GUIDE test strip TEST UP TO 4 TIMES A DAY 100 each 0   Accu-Chek Softclix Lancets lancets TEST 4 TIMES A DAY 100 each 1   albuterol (VENTOLIN HFA) 108 (90 Base) MCG/ACT inhaler Inhale 2 puffs into the lungs every 4 (four) hours as needed for wheezing or shortness of breath. 18 g 2   atenolol (TENORMIN) 25 MG tablet TAKE 1 TABLET (25 MG TOTAL) BY MOUTH DAILY. 90 tablet 3    atorvastatin (LIPITOR) 10 MG tablet TAKE 1 TABLET (10 MG TOTAL) BY MOUTH AT BEDTIME. 90 tablet 3   BD PEN NEEDLE NANO U/F 32G X 4 MM MISC USE WITH OZEMPIC 100 each 0   blood glucose meter kit and supplies Dispense based on patient and insurance preference. Use up to four times daily as directed. (FOR ICD-10 E10.9, E11.9). 1 each 0   clindamycin (CLEOCIN T) 1 % external solution Apply to aa's scalp QD PRN 60 mL 3   clotrimazole (LOTRIMIN) 1 % cream Apply 1 application topically 2 (two) times daily. 30 g 0   cyclobenzaprine (FLEXERIL) 5 MG tablet Take 5 mg by mouth 2 (two) times daily as needed for muscle spasms.      DULoxetine (CYMBALTA) 60 MG capsule TAKE 1 CAPSULE (60 MG TOTAL) BY MOUTH 2 (TWO) TIMES DAILY. 180 capsule 0   Fluticasone-Umeclidin-Vilant (TRELEGY ELLIPTA) 100-62.5-25 MCG/INH AEPB Inhale 1 puff into the lungs daily. 28 each 11   gabapentin (NEURONTIN) 300 MG capsule Take 600 mg by mouth 2 (two) times daily.      hydrochlorothiazide (HYDRODIURIL) 25 MG tablet Take 1 tablet (25 mg total) by mouth daily. 90 tablet 1   HYDROcodone-acetaminophen (NORCO/VICODIN) 5-325 MG tablet 1 po qday prn.     Lancet Devices (TRUEDRAW LANCING DEVICE) MISC      LATUDA 60 MG TABS TAKE 1 TABLET (60 MG TOTAL) BY MOUTH DAILY WITH SUPPER. 30 tablet 1   losartan (COZAAR) 100 MG tablet TAKE 1 TABLET (100 MG TOTAL) BY MOUTH DAILY. 90 tablet 3   meloxicam (MOBIC) 15 MG tablet Take by mouth.     omeprazole (PRILOSEC) 20 MG capsule Take 1 capsule (20 mg total) by mouth daily as needed. Take 30 mins to 1 hr before breakfast 90 capsule 1   omeprazole (PRILOSEC) 40 MG capsule Take 1 capsule (40 mg total) by mouth daily. 90 capsule 3   potassium chloride (KLOR-CON) 10 MEQ tablet Take 1 tablet (10 mEq total) by mouth every other day. 90 tablet 1   Semaglutide,0.25 or 0.5MG/DOS, (OZEMPIC, 0.25 OR 0.5 MG/DOSE,) 2 MG/1.5ML SOPN Inject 0.5 mg into the skin once a week. 3 mL 2   vitamin B-12 (CYANOCOBALAMIN) 1000 MCG tablet  Take 1,000 mcg by mouth daily.      Vitamin D, Ergocalciferol, (DRISDOL) 1.25 MG (50000 UNIT) CAPS capsule TAKE 1 CAPSULE BY MOUTH EVERY SEVEN DAYS. RECHECK LAB 1-2 WEEKS AFTER COMPLETION 12 capsule 0   No current facility-administered medications for this visit.     Musculoskeletal: Strength & Muscle Tone:  UTA Gait & Station: normal Patient leans: N/A  Psychiatric Specialty Exam: Review of Systems  Psychiatric/Behavioral:  Positive for sleep disturbance. The patient is nervous/anxious.   All other systems reviewed and are negative.  There were no vitals taken for this visit.There is no height or weight on file to calculate BMI.  General  Appearance: Casual  Eye Contact:  Good  Speech:  Clear and Coherent  Volume:  Normal  Mood:  Anxious  Affect:  Congruent  Thought Process:  Goal Directed and Descriptions of Associations: Intact  Orientation:  Full (Time, Place, and Person)  Thought Content: Logical   Suicidal Thoughts:  No  Homicidal Thoughts:  No  Memory:  Immediate;   Fair Recent;   Fair Remote;   Fair  Judgement:  Fair  Insight:  Fair  Psychomotor Activity:  Normal  Concentration:  Concentration: Fair and Attention Span: Fair  Recall:  [employer] of Knowledge: Fair  Language: Fair  Akathisia:  No  Handed:  Right  AIMS (if indicated): done  Assets:  Communication Skills Desire for Improvement Housing Social Support Transportation  ADL's:  Intact  Cognition: WNL  Sleep:   Restless   Screenings: GAD-7    Flowsheet Row Office Visit from 11/06/2019 in [facility] Office Visit from 09/06/2018 in [place]  Total GAD-7 Score 21 14      PHQ2-9    Flowsheet Row Video Visit from 12/03/2020 in [place] Video Visit from 08/11/2020 in [place] Video Visit from 07/03/2020 in [place] Office Visit from 05/28/2020 in [place] from 05/27/2020 in [place]  PHQ-2 Total Score _0 PHQ-9 Total Score 7 10 -- 6 --      Flowsheet Row Video Visit from 08/11/2020 in [place] Video Visit from 07/03/2020 in [place] Low Risk Error: Q6 is Yes, you must answer 7        Assessment and Plan: VAANI MORREN is a 60 year old Caucasian female who has a history of bipolar disorder, degenerative disc disease, multiple medical problems was evaluated by telemedicine today.  Patient recently lost her dog and is currently grieving, reports anxiety and sleep problems.  Discussed plan as noted below.  Plan Bipolar disorder type I depressed-in remission Latuda 60 mg p.o. daily Dose reduced due to side effects of fatigue Cymbalta 60 mg p.o. daily  PTSD-stable Cymbalta 60 mg p.o. daily Patient to restart psychotherapy sessions with her therapist-Mr.Mostafa  Bereavement-unstable Start hydroxyzine 25 mg p.o. 3 times daily as needed-patient was noncompliant. Advised patient to take hydroxyzine at night for sleep as well.  Tobacco use disorder-unstable Provided counseling for 2 minutes.  She has nicotine patches available.  Alcohol use disorder in remission Continue AA meetings.  Follow-up in clinic in 3 to 4 weeks or sooner if needed.  This note was generated in part or whole with voice recognition software. Voice recognition is usually quite accurate but there are transcription errors that can and very often do occur. I apologize for any typographical errors that were not detected and corrected.     Ursula Alert, MD 02/05/2021, 9:53 AM

## 2021-02-10 ENCOUNTER — Other Ambulatory Visit: Payer: Self-pay

## 2021-02-10 ENCOUNTER — Other Ambulatory Visit (INDEPENDENT_AMBULATORY_CARE_PROVIDER_SITE_OTHER): Payer: Medicare Other

## 2021-02-10 DIAGNOSIS — I10 Essential (primary) hypertension: Secondary | ICD-10-CM

## 2021-02-10 DIAGNOSIS — I700 Atherosclerosis of aorta: Secondary | ICD-10-CM

## 2021-02-10 LAB — BASIC METABOLIC PANEL
BUN: 19 mg/dL (ref 6–23)
CO2: 25 mEq/L (ref 19–32)
Calcium: 9.7 mg/dL (ref 8.4–10.5)
Chloride: 95 mEq/L — ABNORMAL LOW (ref 96–112)
Creatinine, Ser: 0.94 mg/dL (ref 0.40–1.20)
GFR: 66.17 mL/min (ref >60.00)
Glucose, Bld: 79 mg/dL (ref 70–99)
Potassium: 3.5 mEq/L (ref 3.5–5.1)
Sodium: 132 mEq/L — ABNORMAL LOW (ref 135–145)

## 2021-02-10 LAB — CBC WITH DIFFERENTIAL/PLATELET
Basophils Absolute: 0.1 10*3/uL (ref 0.0–0.1)
Basophils Relative: 0.3 % (ref 0.0–3.0)
Eosinophils Absolute: 0 10*3/uL (ref 0.0–0.7)
Eosinophils Relative: 0.1 % (ref 0.0–5.0)
HCT: 39 % (ref 36.0–46.0)
Hemoglobin: 13.4 g/dL (ref 12.0–15.0)
Lymphocytes Relative: 15.9 % (ref 12.0–46.0)
Lymphs Abs: 2.7 10*3/uL (ref 0.7–4.0)
MCHC: 34.3 g/dL (ref 30.0–36.0)
MCV: 95.2 fl (ref 78.0–100.0)
Monocytes Absolute: 1 10*3/uL (ref 0.1–1.0)
Monocytes Relative: 6.1 % (ref 3.0–12.0)
Neutro Abs: 13.3 10*3/uL — ABNORMAL HIGH (ref 1.4–7.7)
Neutrophils Relative %: 77.6 % — ABNORMAL HIGH (ref 43.0–77.0)
Platelets: 274 10*3/uL (ref 150.0–400.0)
RBC: 4.1 Mil/uL (ref 3.87–5.11)
RDW: 13.2 % (ref 11.5–15.5)
WBC: 17.1 10*3/uL — ABNORMAL HIGH (ref 4.0–10.5)

## 2021-02-10 LAB — LIPID PANEL
Cholesterol: 151 mg/dL (ref 0–200)
HDL: 55.2 mg/dL (ref >39.00)
LDL Cholesterol: 71 mg/dL (ref 0–99)
NonHDL: 95.63
Total CHOL/HDL Ratio: 3
Triglycerides: 124 mg/dL (ref 0.0–149.0)
VLDL: 24.8 mg/dL (ref 0.0–40.0)

## 2021-02-12 ENCOUNTER — Ambulatory Visit: Payer: Medicare Other | Admitting: Psychology

## 2021-02-12 ENCOUNTER — Other Ambulatory Visit: Payer: Self-pay

## 2021-02-12 ENCOUNTER — Other Ambulatory Visit: Payer: Self-pay | Admitting: Family

## 2021-02-12 ENCOUNTER — Telehealth: Payer: Self-pay

## 2021-02-12 DIAGNOSIS — I10 Essential (primary) hypertension: Secondary | ICD-10-CM

## 2021-02-12 DIAGNOSIS — R899 Unspecified abnormal finding in specimens from other organs, systems and tissues: Secondary | ICD-10-CM

## 2021-02-12 MED ORDER — POTASSIUM CHLORIDE CRYS ER 10 MEQ PO TBCR: 10.0000 meq | EXTENDED_RELEASE_TABLET | Freq: Every day | ORAL | 1 refills | Status: DC

## 2021-02-12 NOTE — Telephone Encounter (Signed)
LMTCB for labs. 

## 2021-02-15 ENCOUNTER — Encounter: Payer: Self-pay | Admitting: Gastroenterology

## 2021-02-16 ENCOUNTER — Encounter
Admission: RE | Disposition: A | Discharge status: Home / Self Care | Payer: Self-pay | Source: Home / Self Care | Attending: Gastroenterology

## 2021-02-16 ENCOUNTER — Ambulatory Visit: Payer: Medicare Other | Admitting: Anesthesiology

## 2021-02-16 ENCOUNTER — Ambulatory Visit
Admission: RE | Admit: 2021-02-16 | Discharge: 2021-02-16 | Disposition: A | Discharge status: Home / Self Care | Payer: Medicare Other | Attending: Gastroenterology | Admitting: Gastroenterology

## 2021-02-16 ENCOUNTER — Encounter: Payer: Self-pay | Admitting: Gastroenterology

## 2021-02-16 DIAGNOSIS — J432 Centrilobular emphysema: Secondary | ICD-10-CM | POA: Insufficient documentation

## 2021-02-16 DIAGNOSIS — K219 Gastro-esophageal reflux disease without esophagitis: Secondary | ICD-10-CM | POA: Insufficient documentation

## 2021-02-16 DIAGNOSIS — Z87891 Personal history of nicotine dependence: Secondary | ICD-10-CM | POA: Insufficient documentation

## 2021-02-16 DIAGNOSIS — I10 Essential (primary) hypertension: Secondary | ICD-10-CM | POA: Diagnosis not present

## 2021-02-16 DIAGNOSIS — E785 Hyperlipidemia, unspecified: Secondary | ICD-10-CM | POA: Diagnosis not present

## 2021-02-16 DIAGNOSIS — Z1211 Encounter for screening for malignant neoplasm of colon: Secondary | ICD-10-CM

## 2021-02-16 DIAGNOSIS — Z86010 Personal history of colonic polyps: Secondary | ICD-10-CM | POA: Insufficient documentation

## 2021-02-16 DIAGNOSIS — Z888 Allergy status to other drugs, medicaments and biological substances status: Secondary | ICD-10-CM | POA: Diagnosis not present

## 2021-02-16 DIAGNOSIS — G4733 Obstructive sleep apnea (adult) (pediatric): Secondary | ICD-10-CM | POA: Diagnosis not present

## 2021-02-16 HISTORY — PX: COLONOSCOPY WITH PROPOFOL: SHX5780

## 2021-02-16 LAB — GLUCOSE, CAPILLARY: Glucose-Capillary: 79 mg/dL (ref 70–99)

## 2021-02-16 SURGERY — COLONOSCOPY WITH PROPOFOL
Anesthesia: General

## 2021-02-16 MED ORDER — PROPOFOL 10 MG/ML IV BOLUS
INTRAVENOUS | Status: DC | PRN
  Administered 2021-02-16: 50 mg via INTRAVENOUS

## 2021-02-16 MED ORDER — SODIUM CHLORIDE 0.9 % IV SOLN
INTRAVENOUS | Status: DC
  Administered 2021-02-16: 1000 mL via INTRAVENOUS

## 2021-02-16 MED ORDER — PROPOFOL 10 MG/ML IV BOLUS
INTRAVENOUS | Status: AC
  Filled 2021-02-16: qty 20

## 2021-02-16 MED ORDER — PROPOFOL 500 MG/50ML IV EMUL
INTRAVENOUS | Status: DC | PRN
  Administered 2021-02-16: 150 ug/kg/min via INTRAVENOUS

## 2021-02-16 MED ORDER — LIDOCAINE HCL (CARDIAC) PF 100 MG/5ML IV SOSY
PREFILLED_SYRINGE | INTRAVENOUS | Status: DC | PRN
  Administered 2021-02-16: 50 mg via INTRAVENOUS

## 2021-02-16 NOTE — Anesthesia Procedure Notes (Signed)
Procedure Name: MAC Date/Time: 02/16/2021 11:33 AM Performed by: Jerrye Noble, CRNA Pre-anesthesia Checklist: Patient identified, Emergency Drugs available, Suction available and Patient being monitored Patient Re-evaluated:Patient Re-evaluated prior to induction Oxygen Delivery Method: Nasal cannula

## 2021-02-16 NOTE — Anesthesia Postprocedure Evaluation (Signed)
Anesthesia Post Note  Patient: Susan [name]  Procedure(s) Performed: COLONOSCOPY WITH PROPOFOL  Patient location during evaluation: Phase II Anesthesia Type: General Level of consciousness: awake and alert, awake and oriented Pain management: pain level controlled Vital Signs Assessment: post-procedure vital signs reviewed and stable Respiratory status: spontaneous breathing, nonlabored ventilation and respiratory function stable Cardiovascular status: blood pressure returned to baseline and stable Postop Assessment: no apparent nausea or vomiting Anesthetic complications: no   No notable events documented.   Last Vitals:  Vitals:   02/16/21 1050 02/16/21 1148  BP: (!) 191/104 120/75  Pulse: (!) 106 94  Resp: 20 20  Temp: (!) 36.2 C   SpO2: 99% 91%    Last Pain:  Vitals:   02/16/21 1208  TempSrc:   PainSc: 0-No pain                 Phill Mutter

## 2021-02-16 NOTE — H&P (Signed)
Jonathon Bellows, MD [address], [place], [place], [place], [address] [id number], [place], [place], [place], [address] Phone: [phone]  Fax: [phone]  Primary Care Physician:  Doreen Beam, FNP   Pre-Procedure History & Physical: HPI:  Susan [name] is a 60 y.o. female is here for an colonoscopy.   Past Medical History:  Diagnosis Date   ADD (attention deficit disorder)    Alcohol abuse    Anemia    Anxiety    Aortic atherosclerosis ([place]) 02/20/2018   Chest CT Sept 2019   Arthritis    rheumatoid arthritis   Asthma    Bipolar disorder ([place])    Centrilobular emphysema ([place]) 02/20/2018   Chest CT Sept 2019   Constipation    Degenerative disc disease at L5-S1 level 09/28/2016   See ortho note May 2018   Depression    bipolar, hx of suicide attempt   Drug use    Dyspnea    with exertion   GERD (gastroesophageal reflux disease)    H/O suicide attempt    slit wrists   Hepatitis C 06/26/2014   Hip pain    History of alcohol abuse    History of diverticulitis 2013   History of hepatitis C    HEP "C"--three years ago   History of MRSA infection 2013   HLD (hyperlipidemia)    Hypertension    Hypothyroidism    Incisional hernia 11/08/2012   Knee pain    MRSA carrier Nov 2013   Multinodular thyroid    OSA (obstructive sleep apnea)    managed by Dr. Manuella Ghazi   Osteoporosis    Post-traumatic osteoarthritis of right knee 09/24/2015   Prediabetes    Recurrent ventral hernia 11/08/2012   Sleep apnea     Dx 3 years ago. Use C-PAP   Status post total right knee replacement using cement 05/10/2016   Thyroid disease    Goiter   Vitamin B12 deficiency    Vitamin D deficiency disease     Past Surgical History:  Procedure Laterality Date   BILATERAL SALPINGOOPHORECTOMY     due to abnormal mass   BREAST BIOPSY Left    neg   BREAST SURGERY Left 20 yrs ago   CESAREAN SECTION     COLONOSCOPY     COLONOSCOPY WITH PROPOFOL N/A 10/16/2017   Procedure: COLONOSCOPY  WITH PROPOFOL;  Surgeon: Jonathon Bellows, MD;  Location: [facility] ENDOSCOPY;  Service: Gastroenterology;  Laterality: N/A;   HERNIA REPAIR  06/2011, July 2014   Ventral wall repair with Physiomesh   HERNIA REPAIR     2nd.vental wall repair   JOINT REPLACEMENT Right    knee   TONSILLECTOMY     TOTAL HIP ARTHROPLASTY Left 07/23/2019   Procedure: TOTAL HIP ARTHROPLASTY;  Surgeon: Corky Mull, MD;  Location: [facility] ORS;  Service: Orthopedics;  Laterality: Left;   TOTAL KNEE ARTHROPLASTY Right 05/10/2016   Procedure: TOTAL KNEE ARTHROPLASTY;  Surgeon: Corky Mull, MD;  Location: [facility] ORS;  Service: Orthopedics;  Laterality: Right;   TUBAL LIGATION      Prior to Admission medications   Medication Sig Start Date End Date Taking? Authorizing Provider  ACCU-CHEK GUIDE test strip TEST UP TO 4 TIMES A DAY 11/17/20  Yes Bacigalupo, Dionne Bucy, MD  Accu-Chek Softclix Lancets lancets TEST 4 TIMES A DAY 09/29/20  Yes Flinchum, Kelby Aline, FNP  albuterol (VENTOLIN HFA) 108 (90 Base) MCG/ACT inhaler Inhale 2 puffs into the lungs every 4 (four) hours  as needed for wheezing or shortness of breath. 08/21/19  Yes Delsa Grana, PA-C  atenolol (TENORMIN) 25 MG tablet TAKE 1 TABLET (25 MG TOTAL) BY MOUTH DAILY. 08/10/20  Yes Flinchum, Kelby Aline, FNP  atorvastatin (LIPITOR) 10 MG tablet TAKE 1 TABLET (10 MG TOTAL) BY MOUTH AT BEDTIME. 08/10/20  Yes Flinchum, Kelby Aline, FNP  BD PEN NEEDLE NANO U/F 32G X 4 MM MISC USE WITH OZEMPIC 11/17/20  Yes Bacigalupo, Dionne Bucy, MD  blood glucose meter kit and supplies Dispense based on patient and insurance preference. Use up to four times daily as directed. (FOR ICD-10 E10.9, E11.9). 05/28/20  Yes Flinchum, Kelby Aline, FNP  clindamycin (CLEOCIN T) 1 % external solution Apply to aa's scalp QD PRN 11/18/19  Yes Ralene Bathe, MD  clotrimazole (LOTRIMIN) 1 % cream Apply 1 application topically 2 (two) times daily. 08/21/19  Yes Delsa Grana, PA-C  cyclobenzaprine (FLEXERIL) 5 MG tablet Take 5 mg  by mouth 2 (two) times daily as needed for muscle spasms.  06/04/15  Yes [provider]  DULoxetine (CYMBALTA) 60 MG capsule TAKE 1 CAPSULE (60 MG TOTAL) BY MOUTH 2 (TWO) TIMES DAILY. 01/21/21  Yes Ursula Alert, MD  gabapentin (NEURONTIN) 300 MG capsule Take 600 mg by mouth 2 (two) times daily.    Yes [provider]  hydrochlorothiazide (HYDRODIURIL) 25 MG tablet Take 1 tablet (25 mg total) by mouth daily. 02/03/21  Yes Burnard Hawthorne, FNP  HYDROcodone-acetaminophen (NORCO/VICODIN) 5-325 MG tablet 1 po qday prn. 03/19/20  Yes [provider]  hydrOXYzine (VISTARIL) 25 MG capsule Take 1 capsule (25 mg total) by mouth every 8 (eight) hours as needed. For anxiety, sleep 02/05/21  Yes Ursula Alert, MD  Lancet Devices (TRUEDRAW LANCING DEVICE) MISC  09/09/20  Yes [provider]  LATUDA 60 MG TABS TAKE 1 TABLET (60 MG TOTAL) BY MOUTH DAILY WITH SUPPER. 01/21/21  Yes Eappen, Saramma, MD  losartan (COZAAR) 100 MG tablet TAKE 1 TABLET (100 MG TOTAL) BY MOUTH DAILY. 08/10/20  Yes Flinchum, Kelby Aline, FNP  meloxicam (MOBIC) 15 MG tablet Take by mouth. 09/16/19  Yes [provider]  omeprazole (PRILOSEC) 20 MG capsule Take 1 capsule (20 mg total) by mouth daily as needed. Take 30 mins to 1 hr before breakfast 02/03/21  Yes Arnett, Yvetta Coder, FNP  potassium chloride (KLOR-CON) 10 MEQ tablet Take 1 tablet (10 mEq total) by mouth daily. 02/12/21  Yes Arnett, Yvetta Coder, FNP  Semaglutide,0.25 or 0.5MG/DOS, (OZEMPIC, 0.25 OR 0.5 MG/DOSE,) 2 MG/1.5ML SOPN Inject 0.5 mg into the skin once a week. 02/03/21  Yes Arnett, Yvetta Coder, FNP  vitamin B-12 (CYANOCOBALAMIN) 1000 MCG tablet Take 1,000 mcg by mouth daily.    Yes [provider]  Vitamin D, Ergocalciferol, (DRISDOL) 1.25 MG (50000 UNIT) CAPS capsule TAKE 1 CAPSULE BY MOUTH EVERY SEVEN DAYS. RECHECK LAB 1-2 WEEKS AFTER COMPLETION 07/10/20  Yes Flinchum, Kelby Aline, FNP  Fluticasone-Umeclidin-Vilant (TRELEGY  ELLIPTA) 100-62.5-25 MCG/INH AEPB Inhale 1 puff into the lungs daily. Patient not taking: Reported on 02/16/2021 09/29/20   Delsa Grana, PA-C  omeprazole (PRILOSEC) 40 MG capsule Take 1 capsule (40 mg total) by mouth daily. 11/29/19 02/27/20  Delsa Grana, PA-C    Allergies as of 02/04/2021 - Review Complete 02/04/2021  Allergen Reaction Noted   Wellbutrin [bupropion]  03/18/2020   Lasix [furosemide] Other (See Comments) 11/08/2012    Family History  Problem Relation Age of Onset   Arthritis Mother    Asthma Mother  Mental illness Mother    Thyroid disease Mother    COPD Mother    Heart disease Mother    Congestive Heart Failure Mother    Alcohol abuse Mother    Eating disorder Mother    Bipolar disorder Mother    Alcohol abuse Father    Heart attack Father    Alcohol abuse Sister    Drug abuse Sister    Mental illness Sister    Mental illness Sister    Fibromyalgia Sister    Obesity Sister    Pneumonia Sister    Depression Sister    Mental illness Sister    Alcohol abuse Sister    Drug abuse Sister    Arthritis Brother    Mental illness Brother    Cancer Brother        non-hodkins lymphoma   Drug abuse Daughter    Drug abuse Son    Alcohol abuse Son    Drug abuse Son    Alcohol abuse Son    Diabetes Neg Hx    Stroke Neg Hx    Breast cancer Neg Hx    Thyroid cancer Neg Hx     Social History   Socioeconomic History   Marital status: Widowed    Spouse name: Not on file   Number of children: 3   Years of education: Not on file   Highest education level: Some college, no degree  Occupational History   Occupation: disabled  Tobacco Use   Smoking status: Every Day    Packs/day: 1.00    Years: 41.00    Pack years: 41.00    Types: Cigarettes   Smokeless tobacco: Never   Tobacco comments:    REPORTED REDUCED TO HALF PACK, REPORTED  09/02/2020  Vaping Use   Vaping Use: Former   Quit date: 07/15/2017  Substance and Sexual Activity   Alcohol use: No     Alcohol/week: 0.0 standard drinks    Comment: sober since October 2018   Drug use: No    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   Sexual activity: Not Currently  Other Topics Concern   Not on file  Social History Narrative   Not on file   Social Determinants of Health   Financial Resource Strain: Low Risk    Difficulty of Paying Living Expenses: Not hard at all  Food Insecurity: No Food Insecurity   Worried About Charity fundraiser in the Last Year: Never true   [place] in the Last Year: Never true  Transportation Needs: No Transportation Needs   Lack of Transportation (Medical): No   Lack of Transportation (Non-Medical): No  Physical Activity: Inactive   Days of Exercise per Week: 0 days   Minutes of Exercise per Session: 0 min  Stress: No Stress Concern Present   Feeling of Stress : Only a little  Social Connections: Moderately Isolated   Frequency of Communication with Friends and Family: More than three times a week   Frequency of Social Gatherings with Friends and Family: More than three times a week   Attends Religious Services: Never   Marine scientist or Organizations: Yes   Attends Music therapist: More than 4 times per year   Marital Status: Widowed  Human resources officer Violence: Not At Risk   Fear of Current or Ex-Partner: No   Emotionally Abused: No   Physically Abused: No   Sexually Abused: No    Review of Systems:  See HPI, otherwise negative ROS  Physical Exam: BP (!) 191/104   Pulse (!) 106   Temp (!) 97.2 F (36.2 C) (Temporal)   Resp 20   Ht '5\' 1"'  (1.549 m)   Wt 97.1 kg   SpO2 99%   BMI 40.43 kg/m  General:   Alert,  pleasant and cooperative in NAD Head:  Normocephalic and atraumatic. Neck:  Supple; no masses or thyromegaly. Lungs:  Clear throughout to auscultation, normal respiratory effort.    Heart:  +S1, +S2, Regular rate and rhythm, No edema. Abdomen:  Soft, nontender and nondistended. Normal bowel  sounds, without guarding, and without rebound.   Neurologic:  Alert and  oriented x4;  grossly normal neurologically.  Impression/Plan: Susan [name] is here for an colonoscopy to be performed for surveillance due to prior history of colon polyps   Risks, benefits, limitations, and alternatives regarding  colonoscopy have been reviewed with the patient.  Questions have been answered.  All parties agreeable.   Jonathon Bellows, MD  02/16/2021, 11:26 AM

## 2021-02-16 NOTE — Op Note (Signed)
[facility] Gastroenterology Patient Name: Susan [name] Procedure Date: 02/16/2021 11:28 AM MRN: [id number] Account #: [id number] Date of Birth: 13-May-1960 Admit Type: Outpatient Age: 60 Room: [facility] ENDO ROOM 2 Gender: Female Note Status: Finalized Instrument Name: Jasper Riling [id number] Procedure:             Colonoscopy Indications:           Surveillance: Personal history of adenomatous polyps                         on last colonoscopy 3 years ago Providers:             Jonathon Bellows MD, MD Referring MD:          Kelby Aline. Flinchum (Referring MD) Medicines:             Monitored Anesthesia Care Complications:         No immediate complications. Procedure:             Pre-Anesthesia Assessment:                        - Prior to the procedure, a History and Physical was                         performed, and patient medications, allergies and                         sensitivities were reviewed. The patient's tolerance                         of previous anesthesia was reviewed.                        - The risks and benefits of the procedure and the                         sedation options and risks were discussed with the                         patient. All questions were answered and informed                         consent was obtained.                        - ASA Grade Assessment: II - A patient with mild                         systemic disease.                        After obtaining informed consent, the colonoscope was                         passed under direct vision. Throughout the procedure,                         the patient's blood pressure, pulse, and oxygen                         saturations  were monitored continuously. The                         Colonoscope was introduced through the anus and                         advanced to the the cecum, identified by the                         appendiceal orifice. The colonoscopy was performed                          with ease. The patient tolerated the procedure well.                         The quality of the bowel preparation was poor. Findings:      The perianal and digital rectal examinations were normal.      A large amount of semi-liquid stool was found in the entire colon,       interfering with visualization. Impression:            - Preparation of the colon was poor.                        - Stool in the entire examined colon.                        - No specimens collected. Recommendation:        - Discharge patient to home (with escort).                        - Resume previous diet.                        - Continue present medications.                        - Repeat colonoscopy in 3 weeks because the bowel                         preparation was suboptimal. Procedure Code(s):     --- Professional ---                        [phone], Colonoscopy, flexible; diagnostic, including                         collection of specimen(s) by brushing or washing, when                         performed (separate procedure) Diagnosis Code(s):     --- Professional ---                        Z86.010, Personal history of colonic polyps CPT copyright 2019 American Medical Association. All rights reserved. The codes documented in this report are preliminary and upon coder review may  be revised to meet current compliance requirements. Jonathon Bellows, MD Jonathon Bellows MD, MD 02/16/2021 11:45:49 AM This report has been signed electronically. Number of Addenda: 0 Note Initiated On: 02/16/2021 11:28 AM Scope Withdrawal Time: 0  hours 1 minute 45 seconds  Total Procedure Duration: 0 hours 7 minutes 3 seconds  Estimated Blood Loss:  Estimated blood loss: none.      [facility]

## 2021-02-16 NOTE — Anesthesia Preprocedure Evaluation (Signed)
Anesthesia Evaluation  Patient identified by MRN, date of birth, ID band Patient awake    Reviewed: Allergy & Precautions, H&P , NPO status , Patient's Chart, lab work & pertinent test results, reviewed documented beta blocker date and time   History of Anesthesia Complications Negative for: history of anesthetic complications  Airway Mallampati: III  TM Distance: <3 FB Neck ROM: full    Dental  (+) Chipped, Poor Dentition, Missing, Partial Upper, Partial Lower   Pulmonary shortness of breath and with exertion, asthma , sleep apnea and Continuous Positive Airway Pressure Ventilation , COPD,  COPD inhaler, Current Smoker and Patient abstained from smoking.,    Pulmonary exam normal        Cardiovascular Exercise Tolerance: Good hypertension, Pt. on medications and Pt. on home beta blockers (-) angina(-) Past MI and (-) DOE Normal cardiovascular exam     Neuro/Psych PSYCHIATRIC DISORDERS Anxiety Depression Bipolar Disorder  Neuromuscular disease    GI/Hepatic Bowel prep,GERD  Medicated and Controlled,(+) Hepatitis -, C  Endo/Other  Hypothyroidism   Renal/GU      Musculoskeletal  (+) Arthritis , Rheumatoid disorders,    Abdominal   Peds  Hematology negative hematology ROS (+) anemia ,   Anesthesia Other Findings ADD (attention deficit disorder)  Alcohol abuse    Anemia    Anxiety    Aortic atherosclerosis ([place]) 02/20/2018 Chest CT Sept 2019  Arthritis  rheumatoid arthritis Asthma    Bipolar disorder ([place])   Centrilobular emphysema ([place]) 02/20/2018 Chest CT Sept 2019 Constipation    Degenerative disc disease at L5-S1 level 09/28/2016 See ortho note May 2018  Depression  bipolar, hx of suicide attempt  Drug use    Dyspnea  with exertion  GERD (gastroesophageal reflux disease) H/O suicide attempt  slit wrists Hepatitis C 06/26/2014   Hip pain    History of alcohol abuse   History of diverticulitis 2013   History  of hepatitis C  HEP "C"--three years ago  History of MRSA infection 2013  HLD (hyperlipidemia)    Hypertension    Hypothyroidism    Incisional hernia 11/08/2012  Knee pain    MRSA carrier Nov 2013   Multinodular thyroid    OSA (obstructive sleep apnea)  managed by Dr. Manuella Ghazi  Osteoporosis    Post-traumatic osteoarthritis of right knee 09/24/2015   Prediabetes    Recurrent ventral hernia 11/08/2012 Sleep apnea  Dx 3 years ago. Use C-PAP  Status post total right knee replacement using cement 05/10/2016   Thyroid disease  Goiter  Vitamin B12 deficiency   Vitamin D deficiency disease        Reproductive/Obstetrics negative OB ROS                            Anesthesia Physical  Anesthesia Plan  ASA: 3  Anesthesia Plan: General   Post-op Pain Management:    Induction: Intravenous  PONV Risk Score and Plan: 2 and Propofol infusion and TIVA  Airway Management Planned: Natural Airway and Nasal Cannula  Additional Equipment:   Intra-op Plan:   Post-operative Plan:   Informed Consent: I have reviewed the patients History and Physical, chart, labs and discussed the procedure including the risks, benefits and alternatives for the proposed anesthesia with the patient or authorized representative who has indicated his/her understanding and acceptance.     Dental Advisory Given  Plan Discussed with: Anesthesiologist, CRNA and Surgeon  Anesthesia Plan Comments: (Patient reports no bleeding problems  and no anticoagulant use.  Plan for spinal with backup GA  Patient consented for risks of anesthesia including but not limited to:  - adverse reactions to medications - risk of bleeding, infection, nerve damage and headache - risk of failed spinal - damage to teeth, lips or other oral mucosa - sore throat or hoarseness - Damage to heart, brain, lungs or loss of life  Patient voiced understanding.)       Anesthesia Quick Evaluation

## 2021-02-16 NOTE — Transfer of Care (Signed)
Immediate Anesthesia Transfer of Care Note  Patient: Susan [name]  Procedure(s) Performed: COLONOSCOPY WITH PROPOFOL  Patient Location: PACU and Endoscopy Unit  Anesthesia Type:General  Level of Consciousness: awake, drowsy and patient cooperative  Airway & Oxygen Therapy: Patient Spontanous Breathing and Patient connected to nasal cannula oxygen  Post-op Assessment: Report given to RN and Post -op Vital signs reviewed and stable  Post vital signs: Reviewed and stable  Last Vitals:  Vitals Value Taken Time  BP 120/75 02/16/21 1148  Temp    Pulse 89 02/16/21 1150  Resp 16 02/16/21 1150  SpO2 92 % 02/16/21 1150  Vitals shown include unvalidated device data.  Last Pain:  Vitals:   02/16/21 1148  TempSrc:   PainSc: 0-No pain         Complications: No notable events documented.

## 2021-02-18 ENCOUNTER — Encounter: Payer: Self-pay | Admitting: Gastroenterology

## 2021-02-19 ENCOUNTER — Other Ambulatory Visit: Payer: Medicare Other

## 2021-03-04 ENCOUNTER — Other Ambulatory Visit: Payer: Self-pay | Admitting: Family Medicine

## 2021-03-04 NOTE — Telephone Encounter (Signed)
   Notes to clinic size, gauge of needles inconsistent with current med list, please assess.

## 2021-03-04 NOTE — Telephone Encounter (Signed)
Requested Prescriptions  Pending Prescriptions Disp Refills  . ACCU-CHEK GUIDE test strip [Pharmacy Med Name: Accu-Chek Guide STRP] 100 each 0    Sig: TEST UP TO 4 TIMES A DAY     Endocrinology: Diabetes - Testing Supplies Passed - 03/04/2021 10:43 AM      Passed - Valid encounter within last 12 months    Recent Outpatient Visits          6 months ago Type 2 diabetes mellitus without complication, without long-term current use of insulin ([place])   [facility] Flinchum, Kelby Aline, FNP   9 months ago Type 2 diabetes mellitus without complication, without long-term current use of insulin ([place])   [facility] Flinchum, Kelby Aline, FNP   11 months ago Weight gain   [facility], Kelby Aline, FNP   1 year ago Essential hypertension   [place] Flinchum, Kelby Aline, [place]   1 year ago Essential hypertension   [employer] [facility], Wendee Beavers, PA-C      Future Appointments            In 2 weeks [place], Yvetta Coder, [place], [place]           . B-D UF III MINI PEN NEEDLES 31G X 5 MM MISC [Pharmacy Med Name: BD Pen Needle Mini U/F 31G X 5 MM MISC] 100 each     Sig: USE WITH OZEMPIC     Endocrinology: Diabetes - Testing Supplies Passed - 03/04/2021 10:43 AM      Passed - Valid encounter within last 12 months    Recent Outpatient Visits          6 months ago Type 2 diabetes mellitus without complication, without long-term current use of insulin ([place])   [facility] Flinchum, Kelby Aline, FNP   9 months ago Type 2 diabetes mellitus without complication, without long-term current use of insulin ([place])   [facility] Flinchum, Kelby Aline, FNP   11 months ago Weight gain   [facility], Kelby Aline, FNP   1 year ago Essential hypertension   [place] Flinchum, Kelby Aline, [place]   1 year ago Essential hypertension   [employer], Wendee Beavers, PA-C      Future Appointments            In 2 weeks [place], Yvetta Coder, [place], [facility]

## 2021-03-05 ENCOUNTER — Telehealth: Payer: Medicare Other | Admitting: Psychiatry

## 2021-03-09 ENCOUNTER — Other Ambulatory Visit: Payer: Self-pay | Admitting: Family Medicine

## 2021-03-09 NOTE — Telephone Encounter (Signed)
Re routed to [facility]

## 2021-03-15 ENCOUNTER — Ambulatory Visit (INDEPENDENT_AMBULATORY_CARE_PROVIDER_SITE_OTHER): Payer: Medicare Other | Admitting: Psychology

## 2021-03-15 DIAGNOSIS — F3289 Other specified depressive episodes: Secondary | ICD-10-CM

## 2021-03-22 ENCOUNTER — Other Ambulatory Visit: Payer: Self-pay | Admitting: Psychiatry

## 2021-03-22 DIAGNOSIS — F3131 Bipolar disorder, current episode depressed, mild: Secondary | ICD-10-CM

## 2021-03-24 ENCOUNTER — Other Ambulatory Visit: Payer: Self-pay

## 2021-03-24 ENCOUNTER — Ambulatory Visit (INDEPENDENT_AMBULATORY_CARE_PROVIDER_SITE_OTHER): Payer: Medicare Other | Admitting: Family

## 2021-03-24 ENCOUNTER — Other Ambulatory Visit (HOSPITAL_COMMUNITY)
Admission: RE | Admit: 2021-03-24 | Discharge: 2021-03-24 | Disposition: A | Discharge status: Home / Self Care | Payer: Medicare Other | Source: Ambulatory Visit | Attending: Family | Admitting: Family

## 2021-03-24 ENCOUNTER — Encounter: Payer: Self-pay | Admitting: Family

## 2021-03-24 VITALS — BP 120/68 | HR 70 | Temp 97.9°F | Ht 61.0 in | Wt 209.0 lb

## 2021-03-24 DIAGNOSIS — Z6841 Body Mass Index (BMI) 40.0 and over, adult: Secondary | ICD-10-CM

## 2021-03-24 DIAGNOSIS — Z1151 Encounter for screening for human papillomavirus (HPV): Secondary | ICD-10-CM | POA: Insufficient documentation

## 2021-03-24 DIAGNOSIS — Z0000 Encounter for general adult medical examination without abnormal findings: Secondary | ICD-10-CM | POA: Insufficient documentation

## 2021-03-24 DIAGNOSIS — Z1231 Encounter for screening mammogram for malignant neoplasm of breast: Secondary | ICD-10-CM

## 2021-03-24 DIAGNOSIS — I10 Essential (primary) hypertension: Secondary | ICD-10-CM

## 2021-03-24 DIAGNOSIS — Z01419 Encounter for gynecological examination (general) (routine) without abnormal findings: Secondary | ICD-10-CM | POA: Diagnosis present

## 2021-03-24 DIAGNOSIS — Z23 Encounter for immunization: Secondary | ICD-10-CM | POA: Diagnosis not present

## 2021-03-24 DIAGNOSIS — R8290 Unspecified abnormal findings in urine: Secondary | ICD-10-CM | POA: Diagnosis not present

## 2021-03-24 LAB — CBC WITH DIFFERENTIAL/PLATELET
Basophils Absolute: 0.1 10*3/uL (ref 0.0–0.1)
Basophils Relative: 0.5 % (ref 0.0–3.0)
Eosinophils Absolute: 0.1 10*3/uL (ref 0.0–0.7)
Eosinophils Relative: 0.7 % (ref 0.0–5.0)
HCT: 39.9 % (ref 36.0–46.0)
Hemoglobin: 13.8 g/dL (ref 12.0–15.0)
Lymphocytes Relative: 17.6 % (ref 12.0–46.0)
Lymphs Abs: 2.2 10*3/uL (ref 0.7–4.0)
MCHC: 34.5 g/dL (ref 30.0–36.0)
MCV: 94.5 fl (ref 78.0–100.0)
Monocytes Absolute: 1 10*3/uL (ref 0.1–1.0)
Monocytes Relative: 8.2 % (ref 3.0–12.0)
Neutro Abs: 9.2 10*3/uL — ABNORMAL HIGH (ref 1.4–7.7)
Neutrophils Relative %: 73 % (ref 43.0–77.0)
Platelets: 261 10*3/uL (ref 150.0–400.0)
RBC: 4.22 Mil/uL (ref 3.87–5.11)
RDW: 12.4 % (ref 11.5–15.5)
WBC: 12.6 10*3/uL — ABNORMAL HIGH (ref 4.0–10.5)

## 2021-03-24 LAB — URINALYSIS, ROUTINE W REFLEX MICROSCOPIC
Bilirubin Urine: NEGATIVE
Ketones, ur: NEGATIVE
Nitrite: POSITIVE — AB
Specific Gravity, Urine: 1.01 (ref 1.000–1.030)
Urine Glucose: NEGATIVE
Urobilinogen, UA: 1 (ref 0.0–1.0)
pH: 7.5 (ref 5.0–8.0)

## 2021-03-24 LAB — COMPREHENSIVE METABOLIC PANEL
ALT: 15 U/L (ref 0–35)
AST: 15 U/L (ref 0–37)
Albumin: 4.5 g/dL (ref 3.5–5.2)
Alkaline Phosphatase: 53 U/L (ref 39–117)
BUN: 13 mg/dL (ref 6–23)
CO2: 29 mEq/L (ref 19–32)
Calcium: 9.4 mg/dL (ref 8.4–10.5)
Chloride: 93 mEq/L — ABNORMAL LOW (ref 96–112)
Creatinine, Ser: 1.04 mg/dL (ref 0.40–1.20)
GFR: 58.56 mL/min — ABNORMAL LOW (ref >60.00)
Glucose, Bld: 82 mg/dL (ref 70–99)
Potassium: 3.9 mEq/L (ref 3.5–5.1)
Sodium: 130 mEq/L — ABNORMAL LOW (ref 135–145)
Total Bilirubin: 0.8 mg/dL (ref 0.2–1.2)
Total Protein: 6.6 g/dL (ref 6.0–8.3)

## 2021-03-24 MED ORDER — POTASSIUM CHLORIDE CRYS ER 10 MEQ PO TBCR: 10.0000 meq | EXTENDED_RELEASE_TABLET | ORAL | 1 refills | Status: DC

## 2021-03-24 MED ORDER — OZEMPIC (0.25 OR 0.5 MG/DOSE) 2 MG/1.5ML ~~LOC~~ SOPN: 0.5000 mg | PEN_INJECTOR | SUBCUTANEOUS | 2 refills | Status: DC

## 2021-03-24 NOTE — Patient Instructions (Addendum)
Referral for lung cancer screening program. Bone density and mammogram as scheduled   congratulations on weight loss!  This is  Dr. Lupita Dawn  example of a  "Low GI"  Diet:  It will allow you to lose 4 to 8  lbs  per month if you follow it carefully.  Your goal with exercise is a minimum of 30 minutes of aerobic exercise 5 days per week (Walking does not count once it becomes easy!)    All of the foods can be found at grocery stores and in bulk at [employer].  The Atkins protein bars and shakes are available in more varieties at [employer], [employer] and [place].     7 AM Breakfast:  Choose from the following:  Low carbohydrate Protein  Shakes (I recommend the  Premier Protein chocolate shakes,  EAS AdvantEdge "Carb Control" shakes  Or the Atkins shakes all are under 3 net carbs)     a scrambled egg/bacon/cheese burrito made with [employer]'s "carb balance" whole wheat tortilla  (about 10 net carbs )  Regulatory affairs officer (basically a quiche without the pastry crust) that is eaten cold and very convenient way to get your eggs.  8 carbs)  If you make your own protein shakes, avoid bananas and pineapple,  And use low carb greek yogurt or original /unsweetened almond or soy milk    Avoid cereal and bananas, oatmeal and cream of wheat and grits. They are loaded with carbohydrates!   10 AM: high protein snack:  Protein bar by Atkins (the snack size, under 200 cal, usually < 6 net carbs).    A stick of cheese:  Around 1 carb,  100 cal     Dannon Light n Fit [place] Yogurt  (80 cal, 8 carbs)  Other so called "protein bars" and Greek yogurts tend to be loaded with carbohydrates.  Remember, in food advertising, the word "energy" is synonymous for " carbohydrate."  Lunch:   A Sandwich using the bread choices listed, Can use any  Eggs,  lunchmeat, grilled meat or canned tuna), avocado, regular mayo/mustard  and cheese.  A Salad using blue cheese, ranch,  Goddess or vinagrette,  Avoid taco  shells, croutons or "confetti" and no "candied nuts" but regular nuts OK.   No pretzels, nabs  or chips.  Pickles and miniature sweet peppers are a good low carb alternative that provide a "crunch"  The bread is the only source of carbohydrate in a sandwich and  can be decreased by trying some of the attached alternatives to traditional loaf bread   Avoid "Low fat dressings, as well as [place] dressings They are loaded with sugar!   3 PM/ Mid day  Snack:  Consider  1 ounce of  almonds, walnuts, pistachios, pecans, peanuts,  Macadamia nuts or a nut medley.  Avoid "granola and granola bars "  Mixed nuts are ok in moderation as long as there are no raisins,  cranberries or dried fruit.   KIND bars are OK if you get the low glycemic index variety   Try the prosciutto/mozzarella cheese sticks by [employer]  In deli /backery section   High protein      6 PM  Dinner:     Meat/fowl/fish with a green salad, and either broccoli, cauliflower, green beans, spinach, brussel sprouts or  Lima beans. DO NOT BREAD THE PROTEIN!!      There is a low carb pasta by [employer] that is acceptable and  tastes great: only 5 digestible carbs/serving.( All grocery stores but [employer]Js carry it ) Several ready made meals are available low carb:   Try Michel Angelo's chicken piccata or chicken or eggplant parm over low carb pasta.(Lowes and [employer]Js)   Marjory Lies Sanchez's "Carnitas" (pulled pork, no sauce,  0 carbs) or his beef pot roast to make a dinner burrito (at [employer])  Pesto over low carb pasta (bj's sells a good quality pesto in the center refrigerated section of the deli   Try satueeing  Cheral Marker with mushroooms as a good side   [employer] makes a mashed cauliflower that tastes like mashed potatoes  Whole wheat pasta is still full of digestible carbs and  Not as low in glycemic index as Dreamfield's.   Brown rice is still rice,  So skip the rice and noodles if you eat [place] or [place] (or at least limit to  1/2 cup)  9 PM snack :   Breyer's "low carb" fudgsicle or  ice cream bar (Carb Smart line), or  Weight Watcher's ice cream bar , or another "no sugar added" ice cream;  a serving of fresh berries/cherries with whipped cream   Cheese or DANNON'S LlGHT N FIT GREEK YOGURT  8 ounces of [place] unsweetened almond/cococunut milk    Treat yourself to a parfait made with whipped cream blueberiies, walnuts and vanilla greek yogurt  Avoid bananas, pineapple, grapes  and watermelon on a regular basis because they are high in sugar.  THINK OF THEM AS DESSERT  Remember that snack Substitutions should be less than 10 NET carbs per serving and meals < 20 carbs. Remember to subtract fiber grams to get the "net carbs."  Health Maintenance for Postmenopausal Women Menopause is a normal process in which your ability to get pregnant comes to an end. This process happens slowly over many months or years, usually between the ages of 19 and 47. Menopause is complete when you have missed your menstrual period for 12 months. It is important to talk with your health care provider about some of the most common conditions that affect women after menopause (postmenopausal women). These include heart disease, cancer, and bone loss (osteoporosis). Adopting a healthy lifestyle and getting preventive care can help to promote your health and wellness. The actions you take can also lower your chances of developing some of these common conditions. What are the signs and symptoms of menopause? During menopause, you may have the following symptoms: Hot flashes. These can be moderate or severe. Night sweats. Decrease in sex drive. Mood swings. Headaches. Tiredness (fatigue). Irritability. Memory problems. Problems falling asleep or staying asleep. Talk with your health care provider about treatment options for your symptoms. Do I need hormone replacement therapy? Hormone replacement therapy is effective in treating  symptoms that are caused by menopause, such as hot flashes and night sweats. Hormone replacement carries certain risks, especially as you become older. If you are thinking about using estrogen or estrogen with progestin, discuss the benefits and risks with your health care provider. How can I reduce my risk for heart disease and stroke? The risk of heart disease, heart attack, and stroke increases as you age. One of the causes may be a change in the body's hormones during menopause. This can affect how your body uses dietary fats, triglycerides, and cholesterol. Heart attack and stroke are medical emergencies. There are many things that you can do to help prevent heart disease and stroke. Watch your blood pressure High blood  pressure causes heart disease and increases the risk of stroke. This is more likely to develop in people who have high blood pressure readings or are overweight. Have your blood pressure checked: Every 3-5 years if you are 12-79 years of age. Every year if you are 58 years old or older. Eat a healthy diet  Eat a diet that includes plenty of vegetables, fruits, low-fat dairy products, and lean protein. Do not eat a lot of foods that are high in solid fats, added sugars, or sodium. Get regular exercise Get regular exercise. This is one of the most important things you can do for your health. Most adults should: Try to exercise for at least 150 minutes each week. The exercise should increase your heart rate and make you sweat (moderate-intensity exercise). Try to do strengthening exercises at least twice each week. Do these in addition to the moderate-intensity exercise. Spend less time sitting. Even light physical activity can be beneficial. Other tips Work with your health care provider to achieve or maintain a healthy weight. Do not use any products that contain nicotine or tobacco. These products include cigarettes, chewing tobacco, and vaping devices, such as e-cigarettes.  If you need help quitting, ask your health care provider. Know your numbers. Ask your health care provider to check your cholesterol and your blood sugar (glucose). Continue to have your blood tested as directed by your health care provider. Do I need screening for cancer? Depending on your health history and family history, you may need to have cancer screenings at different stages of your life. This may include screening for: Breast cancer. Cervical cancer. Lung cancer. Colorectal cancer. What is my risk for osteoporosis? After menopause, you may be at increased risk for osteoporosis. Osteoporosis is a condition in which bone destruction happens more quickly than new bone creation. To help prevent osteoporosis or the bone fractures that can happen because of osteoporosis, you may take the following actions: If you are 1-34 years old, get at least 1,000 mg of calcium and at least 600 international units (IU) of vitamin D per day. If you are older than age 69 but younger than age 52, get at least 1,200 mg of calcium and at least 600 international units (IU) of vitamin D per day. If you are older than age 86, get at least 1,200 mg of calcium and at least 800 international units (IU) of vitamin D per day. Smoking and drinking excessive alcohol increase the risk of osteoporosis. Eat foods that are rich in calcium and vitamin D, and do weight-bearing exercises several times each week as directed by your health care provider. How does menopause affect my mental health? Depression may occur at any age, but it is more common as you become older. Common symptoms of depression include: Feeling depressed. Changes in sleep patterns. Changes in appetite or eating patterns. Feeling an overall lack of motivation or enjoyment of activities that you previously enjoyed. Frequent crying spells. Talk with your health care provider if you think that you are experiencing any of these symptoms. General  instructions See your health care provider for regular wellness exams and vaccines. This may include: Scheduling regular health, dental, and eye exams. Getting and maintaining your vaccines. These include: Influenza vaccine. Get this vaccine each year before the flu season begins. Pneumonia vaccine. Shingles vaccine. Tetanus, diphtheria, and pertussis (Tdap) booster vaccine. Your health care provider may also recommend other immunizations. Tell your health care provider if you have ever been abused or do not  feel safe at home. Summary Menopause is a normal process in which your ability to get pregnant comes to an end. This condition causes hot flashes, night sweats, decreased interest in sex, mood swings, headaches, or lack of sleep. Treatment for this condition may include hormone replacement therapy. Take actions to keep yourself healthy, including exercising regularly, eating a healthy diet, watching your weight, and checking your blood pressure and blood sugar levels. Get screened for cancer and depression. Make sure that you are up to date with all your vaccines. This information is not intended to replace advice given to you by your health care provider. Make sure you discuss any questions you have with your health care provider. Document Revised: 09/14/2020 Document Reviewed: 09/14/2020 Elsevier Patient Education  [place].

## 2021-03-24 NOTE — Progress Notes (Signed)
Subjective:    Patient ID: Susan [name], female    DOB: [birth year], 60 y.o.   MRN: [id number]  CC: [name] is a 60 y.o. female who presents today for physical exam.    HPI: Feels well today  No new complaints  She notes urine is cloudy. No dysuria, urinary frequency, flank pain, fever, nausea  HTN- compliant with hydrochlorothiazide 25 mg daily along with atenolol 83m , losartan 1061m potassium chloride 10 meq  QOd. No cp, sob  H/o Prediabetes - last a1c 5.5; compliant with ozempic  H/o osteoporosis Colorectal Cancer Screening: Due.  Done with Dr. AnVicente Males0/03/2021 however suboptimal prep.  Patient will repeat in 3 weeks and she will call [facility].  Breast Cancer Screening: Mammogram due; scheduled Cervical Cancer Screening: due; history of oophorectomy.  Patient states she still has her cervix, uterus. No pelvic pain,         Tetanus - UTD        Pneumococcal - due for PCV20  Labs: Screening labs done prior Exercise: No regular exercise due to CLBP, following with Dr ChSharlet Salina Alcohol use:  sober since 2018, following with AA.  Smoking/tobacco use: smoker.     HISTORY:  Past Medical History:  Diagnosis Date   ADD (attention deficit disorder)    Alcohol abuse    Anemia    Anxiety    Aortic atherosclerosis (HCRidgely10/15/2019   Chest CT Sept 2019   Arthritis    rheumatoid arthritis   Asthma    Bipolar disorder (HCD[place]   Centrilobular emphysema (HCFollett10/15/2019   Chest CT Sept 2019   Constipation    Degenerative disc disease at L5-S1 level 09/28/2016   See ortho note May 2018   Depression    bipolar, hx of suicide attempt   Drug use    Dyspnea    with exertion   GERD (gastroesophageal reflux disease)    H/O suicide attempt    slit wrists   Hepatitis C 06/26/2014   Hip pain    History of alcohol abuse    History of diverticulitis 2013   History of hepatitis C    HEP "C"--three years ago   History of MRSA infection 2013   HLD (hyperlipidemia)     Hypertension    Hypothyroidism    Incisional hernia 11/08/2012   Knee pain    MRSA carrier Nov 2013   Multinodular thyroid    OSA (obstructive sleep apnea)    managed by Dr. ShManuella Ghazi Osteoporosis    Post-traumatic osteoarthritis of right knee 09/24/2015   Prediabetes    Recurrent ventral hernia 11/08/2012   Sleep apnea     Dx 3 years ago. Use C-PAP   Status post total right knee replacement using cement 05/10/2016   Thyroid disease    Goiter   Vitamin B12 deficiency    Vitamin D deficiency disease     Past Surgical History:  Procedure Laterality Date   BILATERAL SALPINGOOPHORECTOMY     due to abnormal mass   BREAST BIOPSY Left    neg   BREAST SURGERY Left 20 yrs ago   CESAREAN SECTION     COLONOSCOPY     COLONOSCOPY WITH PROPOFOL N/A 10/16/2017   Procedure: COLONOSCOPY WITH PROPOFOL;  Surgeon: AnJonathon BellowsMD;  [facility]  Service: Gastroenterology;  Laterality: N/A;   COLONOSCOPY WITH PROPOFOL N/A 02/16/2021   Procedure: COLONOSCOPY WITH PROPOFOL;  Surgeon: AnJonathon BellowsMD;  [facility];  Service: Gastroenterology;  Laterality: N/A;   HERNIA REPAIR  06/2011, July 2014   Ventral wall repair with Physiomesh   HERNIA REPAIR     2nd.vental wall repair   JOINT REPLACEMENT Right    knee   TONSILLECTOMY     TOTAL HIP ARTHROPLASTY Left 07/23/2019   Procedure: TOTAL HIP ARTHROPLASTY;  Surgeon: Corky Mull, MD;  Location: [facility];  Service: Orthopedics;  Laterality: Left;   TOTAL KNEE ARTHROPLASTY Right 05/10/2016   Procedure: TOTAL KNEE ARTHROPLASTY;  Surgeon: Corky Mull, MD;  Location: [facility];  Service: Orthopedics;  Laterality: Right;   TUBAL LIGATION     Family History  Problem Relation Age of Onset   Arthritis Mother    Asthma Mother    Mental illness Mother    Thyroid disease Mother    COPD Mother    Heart disease Mother    Congestive Heart Failure Mother    Alcohol abuse Mother    Eating disorder Mother    Bipolar disorder Mother     Alcohol abuse Father    Heart attack Father    Alcohol abuse Sister    Drug abuse Sister    Mental illness Sister    Mental illness Sister    Fibromyalgia Sister    Obesity Sister    Pneumonia Sister    Depression Sister    Mental illness Sister    Alcohol abuse Sister    Drug abuse Sister    Arthritis Brother    Mental illness Brother    Cancer Brother        non-hodkins lymphoma   Drug abuse Daughter    Drug abuse Son    Alcohol abuse Son    Drug abuse Son    Alcohol abuse Son    Diabetes Neg Hx    Stroke Neg Hx    Breast cancer Neg Hx    Thyroid cancer Neg Hx       ALLERGIES: Wellbutrin [bupropion] and Lasix [furosemide]  Current Outpatient Medications on File Prior to Visit  Medication Sig Dispense Refill   ACCU-CHEK GUIDE test strip TEST UP TO 4 TIMES A DAY 100 each 0   Accu-Chek Softclix Lancets lancets TEST 4 TIMES A DAY 100 each 1   albuterol (VENTOLIN HFA) 108 (90 Base) MCG/ACT inhaler Inhale 2 puffs into the lungs every 4 (four) hours as needed for wheezing or shortness of breath. 18 g 2   atenolol (TENORMIN) 25 MG tablet TAKE 1 TABLET (25 MG TOTAL) BY MOUTH DAILY. 90 tablet 3   atorvastatin (LIPITOR) 10 MG tablet TAKE 1 TABLET (10 MG TOTAL) BY MOUTH AT BEDTIME. 90 tablet 3   B-D UF III MINI PEN NEEDLES 31G X 5 MM MISC USE WITH OZEMPIC 100 each 1   blood glucose meter kit and supplies Dispense based on patient and insurance preference. Use up to four times daily as directed. (FOR ICD-10 E10.9, E11.9). 1 each 0   clindamycin (CLEOCIN T) 1 % external solution Apply to aa's scalp QD PRN 60 mL 3   clotrimazole (LOTRIMIN) 1 % cream Apply 1 application topically 2 (two) times daily. 30 g 0   cyclobenzaprine (FLEXERIL) 5 MG tablet Take 5 mg by mouth 2 (two) times daily as needed for muscle spasms.      DULoxetine (CYMBALTA) 60 MG capsule TAKE 1 CAPSULE (60 MG TOTAL) BY MOUTH 2 (TWO) TIMES DAILY. 180 capsule 0   Fluticasone-Umeclidin-Vilant (TRELEGY ELLIPTA) 100-62.5-25  MCG/INH AEPB Inhale 1 puff  into the lungs daily. 28 each 11   gabapentin (NEURONTIN) 300 MG capsule Take 600 mg by mouth 2 (two) times daily.      hydrochlorothiazide (HYDRODIURIL) 25 MG tablet Take 1 tablet (25 mg total) by mouth daily. 90 tablet 1   HYDROcodone-acetaminophen (NORCO/VICODIN) 5-325 MG tablet 1 po qday prn.     Lancet Devices (TRUEDRAW LANCING DEVICE) MISC      LATUDA 60 MG TABS TAKE 1 TABLET BY MOUTH DAILY WITH SUPPER 30 tablet 1   losartan (COZAAR) 100 MG tablet TAKE 1 TABLET (100 MG TOTAL) BY MOUTH DAILY. 90 tablet 3   meloxicam (MOBIC) 15 MG tablet Take by mouth.     omeprazole (PRILOSEC) 20 MG capsule Take 1 capsule (20 mg total) by mouth daily as needed. Take 30 mins to 1 hr before breakfast 90 capsule 1   vitamin B-12 (CYANOCOBALAMIN) 1000 MCG tablet Take 1,000 mcg by mouth daily.      omeprazole (PRILOSEC) 40 MG capsule Take 1 capsule (40 mg total) by mouth daily. 90 capsule 3   No current facility-administered medications on file prior to visit.    Social History   Tobacco Use   Smoking status: Every Day    Packs/day: 1.00    Years: 41.00    Pack years: 41.00    Types: Cigarettes   Smokeless tobacco: Never   Tobacco comments:    REPORTED REDUCED TO HALF PACK, REPORTED  09/02/2020  Vaping Use   Vaping Use: Former   Quit date: 07/15/2017  Substance Use Topics   Alcohol use: No    Alcohol/week: 0.0 standard drinks    Comment: sober since October 2018   Drug use: No    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    Review of Systems  Constitutional:  Negative for chills, fever and unexpected weight change.  HENT:  Negative for congestion.   Respiratory:  Negative for cough.   Cardiovascular:  Negative for chest pain, palpitations and leg swelling.  Gastrointestinal:  Negative for nausea and vomiting.  Genitourinary:  Negative for pelvic pain.  Musculoskeletal:  Negative for arthralgias and myalgias.  Skin:  Negative for rash.  Neurological:   Negative for headaches.  Hematological:  Negative for adenopathy.  Psychiatric/Behavioral:  Negative for confusion.       Objective:    BP 120/68 (BP Location: Left Arm, Patient Position: Sitting, Cuff Size: Large)   Pulse 70   Temp 97.9 F (36.6 C) (Oral)   Ht '5\' 1"'  (1.549 m)   Wt 209 lb (94.8 kg)   SpO2 99%   BMI 39.49 kg/m   BP Readings from Last 3 Encounters:  03/24/21 120/68  02/16/21 120/75  02/03/21 (!) 172/92   Wt Readings from Last 3 Encounters:  03/24/21 209 lb (94.8 kg)  02/16/21 214 lb (97.1 kg)  02/03/21 214 lb (97.1 kg)    Physical Exam Vitals reviewed.  Constitutional:      Appearance: Normal appearance. She is well-developed.  Eyes:     Conjunctiva/sclera: Conjunctivae normal.  Neck:     Thyroid: No thyroid mass or thyromegaly.  Cardiovascular:     Rate and Rhythm: Normal rate and regular rhythm.     Pulses: Normal pulses.     Heart sounds: Normal heart sounds.  Pulmonary:     Effort: Pulmonary effort is normal.     Breath sounds: Normal breath sounds. No wheezing, rhonchi or rales.  Chest:  Breasts:    Breasts are symmetrical.  Right: No inverted nipple, mass, nipple discharge, skin change or tenderness.     Left: No inverted nipple, mass, nipple discharge, skin change or tenderness.  Abdominal:     General: Bowel sounds are normal. There is no distension.     Palpations: Abdomen is soft. Abdomen is not rigid. There is no fluid wave or mass.     Tenderness: There is no abdominal tenderness. There is no guarding or rebound.  Genitourinary:    Cervix: No cervical motion tenderness, discharge or friability.     Uterus: Not enlarged, not fixed and not tender.      Adnexa:        Right: No mass, tenderness or fullness.         Left: No mass, tenderness or fullness.       Comments: Pap performed. No CMT. Unable to appreciated ovaries. Lymphadenopathy:     Head:     Right side of head: No submental, submandibular, tonsillar, preauricular,  posterior auricular or occipital adenopathy.     Left side of head: No submental, submandibular, tonsillar, preauricular, posterior auricular or occipital adenopathy.     Cervical:     Right cervical: No superficial, deep or posterior cervical adenopathy.    Left cervical: No superficial, deep or posterior cervical adenopathy.     Upper Body:     Right upper body: No pectoral adenopathy.     Left upper body: No pectoral adenopathy.  Skin:    General: Skin is warm and dry.  Neurological:     Mental Status: She is alert.  Psychiatric:        Speech: Speech normal.        Behavior: Behavior normal.        Thought Content: Thought content normal.       Assessment & Plan:   Problem List Items Addressed This Visit       Cardiovascular and Mediastinum   Essential hypertension    Excellent control. Continue  hydrochlorothiazide 25 mg daily along with atenolol 40m , losartan 1072m potassium chloride 10 meq  QOd.  Discussed hyponatremia on recent lab work, and periodic hyponatremia.  Patient is on Cymbalta, hydrochlorothiazide and discussed with her today these are likely the culprits.  We jointly agreed that we are hesitant to trial stop these medications.  Pending sodium level today if stable may opt to follow, if further decrease may plan to trial stop hydrochlorothiazide to determine if this is the cause.      Relevant Medications   potassium chloride (KLOR-CON) 10 MEQ tablet     Other   Class 3 severe obesity with serious comorbidity and body mass index (BMI) of 40.0 to 44.9 in adult ([facility]  Relevant Medications   Semaglutide,0.25 or 0.5MG/DOS, (OZEMPIC, 0.25 OR 0.5 MG/DOSE,) 2 MG/1.5ML SOPN   Routine physical examination    Clinical breast exam performed, Pap smear performed.  Mammogram and bone density are scheduled.  Encouraged continued low glycemic diet and congratulated patient on weight loss.  We agreed that she will likely be able to resume exercise after she has further  lost weight.Referral for lung cancer screening. Patient states she will contact gastroenterology to reschedule colonoscopy.      Other Visit Diagnoses     Routine adult health maintenance    -  Primary   Relevant Orders   Cytology - PAP( [place])   CBC with Differential/Platelet   Comprehensive metabolic panel   Ambulatory Referral for Lung Cancer Scre  DG Bone Density   Foul smelling urine       Relevant Orders   Urinalysis, Routine w reflex microscopic   Urine Culture   Need for pneumococcal vaccination       Relevant Orders   Pneumococcal conjugate vaccine 20-valent (Prevnar 20) (Completed)        I have discontinued Adelyn L. Evrard's Vitamin D (Ergocalciferol) and hydrOXYzine. I have also changed her potassium chloride. Additionally, I am having her maintain her gabapentin, cyclobenzaprine, vitamin B-12, clotrimazole, albuterol, meloxicam, clindamycin, omeprazole, HYDROcodone-acetaminophen, blood glucose meter kit and supplies, losartan, atenolol, atorvastatin, Accu-Chek Softclix Lancets, Trelegy Ellipta, TRUEdraw Lancing Device, DULoxetine, omeprazole, hydrochlorothiazide, Accu-Chek Guide, B-D UF III MINI PEN NEEDLES, Latuda, and Ozempic (0.25 or 0.5 MG/DOSE).   Meds ordered this encounter  Medications   Semaglutide,0.25 or 0.5MG/DOS, (OZEMPIC, 0.25 OR 0.5 MG/DOSE,) 2 MG/1.5ML SOPN    Sig: Inject 0.5 mg into the skin once a week.    Dispense:  3 mL    Refill:  2    Order Specific Question:   Supervising Provider    Answer:   Deborra Medina L [[id number]]   potassium chloride (KLOR-CON) 10 MEQ tablet    Sig: Take 1 tablet (10 mEq total) by mouth every other day.    Dispense:  90 tablet    Refill:  1    Order Specific Question:   Supervising Provider    Answer:   Crecencio Mc [[id number]]     Return precautions given.   Risks, benefits, and alternatives of the medications and treatment plan prescribed today were discussed, and patient expressed understanding.    Education regarding symptom management and diagnosis given to patient on AVS.   Continue to follow with Flinchum, Kelby Aline, FNP for routine health maintenance.   Kamillah L Fertig and I agreed with plan.   Mable Paris, FNP

## 2021-03-24 NOTE — Assessment & Plan Note (Signed)
Excellent control. Continue  hydrochlorothiazide 25 mg daily along with atenolol 25mg  , losartan 100mg , potassium chloride 10 meq  QOd.  Discussed hyponatremia on recent lab work, and periodic hyponatremia.  Patient is on Cymbalta, hydrochlorothiazide and discussed with her today these are likely the culprits.  We jointly agreed that we are hesitant to trial stop these medications.  Pending sodium level today if stable may opt to follow, if further decrease may plan to trial stop hydrochlorothiazide to determine if this is the cause.

## 2021-03-24 NOTE — Assessment & Plan Note (Signed)
Clinical breast exam performed, Pap smear performed.  Mammogram and bone density are scheduled.  Encouraged continued low glycemic diet and congratulated patient on weight loss.  We agreed that she will likely be able to resume exercise after she has further lost weight.Referral for lung cancer screening. Patient states she will contact gastroenterology to reschedule colonoscopy.

## 2021-03-26 ENCOUNTER — Other Ambulatory Visit: Payer: Self-pay

## 2021-03-26 ENCOUNTER — Other Ambulatory Visit: Payer: Self-pay | Admitting: Family

## 2021-03-26 DIAGNOSIS — N3001 Acute cystitis with hematuria: Secondary | ICD-10-CM

## 2021-03-26 DIAGNOSIS — I10 Essential (primary) hypertension: Secondary | ICD-10-CM

## 2021-03-26 DIAGNOSIS — R899 Unspecified abnormal finding in specimens from other organs, systems and tissues: Secondary | ICD-10-CM

## 2021-03-26 LAB — URINE CULTURE
MICRO NUMBER:: 12645569
SPECIMEN QUALITY:: ADEQUATE

## 2021-03-26 MED ORDER — AMOXICILLIN-POT CLAVULANATE 875-125 MG PO TABS: 1.0000 | ORAL_TABLET | Freq: Two times a day (BID) | ORAL | 0 refills | Status: AC

## 2021-03-30 ENCOUNTER — Other Ambulatory Visit (INDEPENDENT_AMBULATORY_CARE_PROVIDER_SITE_OTHER): Payer: Medicare Other

## 2021-03-30 ENCOUNTER — Other Ambulatory Visit: Payer: Self-pay

## 2021-03-30 DIAGNOSIS — R899 Unspecified abnormal finding in specimens from other organs, systems and tissues: Secondary | ICD-10-CM | POA: Diagnosis not present

## 2021-03-30 DIAGNOSIS — I10 Essential (primary) hypertension: Secondary | ICD-10-CM | POA: Diagnosis not present

## 2021-03-30 DIAGNOSIS — Z0000 Encounter for general adult medical examination without abnormal findings: Secondary | ICD-10-CM

## 2021-03-30 LAB — BASIC METABOLIC PANEL
BUN: 8 mg/dL (ref 6–23)
CO2: 28 mEq/L (ref 19–32)
Calcium: 9 mg/dL (ref 8.4–10.5)
Chloride: 98 mEq/L (ref 96–112)
Creatinine, Ser: 0.9 mg/dL (ref 0.40–1.20)
GFR: 69.65 mL/min (ref >60.00)
Glucose, Bld: 74 mg/dL (ref 70–99)
Potassium: 3.7 mEq/L (ref 3.5–5.1)
Sodium: 132 mEq/L — ABNORMAL LOW (ref 135–145)

## 2021-03-30 LAB — URINALYSIS, ROUTINE W REFLEX MICROSCOPIC
Bilirubin Urine: NEGATIVE
Hgb urine dipstick: NEGATIVE
Ketones, ur: NEGATIVE
Leukocytes,Ua: NEGATIVE
Nitrite: NEGATIVE
RBC / HPF: NONE SEEN (ref 0–?)
Specific Gravity, Urine: <=1.005 — AB (ref 1.000–1.030)
Total Protein, Urine: NEGATIVE
Urine Glucose: NEGATIVE
Urobilinogen, UA: 0.2 (ref 0.0–1.0)
pH: 6.5 (ref 5.0–8.0)

## 2021-03-30 LAB — CBC WITH DIFFERENTIAL/PLATELET
Basophils Absolute: 0.1 10*3/uL (ref 0.0–0.1)
Basophils Relative: 0.9 % (ref 0.0–3.0)
Eosinophils Absolute: 0.2 10*3/uL (ref 0.0–0.7)
Eosinophils Relative: 1.6 % (ref 0.0–5.0)
HCT: 37.2 % (ref 36.0–46.0)
Hemoglobin: 12.7 g/dL (ref 12.0–15.0)
Lymphocytes Relative: 29.6 % (ref 12.0–46.0)
Lymphs Abs: 3.2 10*3/uL (ref 0.7–4.0)
MCHC: 34.3 g/dL (ref 30.0–36.0)
MCV: 94.5 fl (ref 78.0–100.0)
Monocytes Absolute: 0.8 10*3/uL (ref 0.1–1.0)
Monocytes Relative: 7.2 % (ref 3.0–12.0)
Neutro Abs: 6.5 10*3/uL (ref 1.4–7.7)
Neutrophils Relative %: 60.7 % (ref 43.0–77.0)
Platelets: 223 10*3/uL (ref 150.0–400.0)
RBC: 3.93 Mil/uL (ref 3.87–5.11)
RDW: 12.3 % (ref 11.5–15.5)
WBC: 10.8 10*3/uL — ABNORMAL HIGH (ref 4.0–10.5)

## 2021-03-30 LAB — CYTOLOGY - PAP
Adequacy: ABSENT
Comment: NEGATIVE
Diagnosis: NEGATIVE
High risk HPV: NEGATIVE

## 2021-03-31 ENCOUNTER — Other Ambulatory Visit: Payer: Self-pay | Admitting: Family

## 2021-03-31 DIAGNOSIS — B379 Candidiasis, unspecified: Secondary | ICD-10-CM

## 2021-03-31 MED ORDER — FLUCONAZOLE 150 MG PO TABS: 150.0000 mg | ORAL_TABLET | Freq: Once | ORAL | 1 refills | Status: AC

## 2021-04-05 ENCOUNTER — Telehealth: Payer: Self-pay | Admitting: Family

## 2021-04-05 ENCOUNTER — Telehealth: Payer: Self-pay

## 2021-04-05 DIAGNOSIS — B379 Candidiasis, unspecified: Secondary | ICD-10-CM

## 2021-04-05 DIAGNOSIS — I10 Essential (primary) hypertension: Secondary | ICD-10-CM

## 2021-04-05 MED ORDER — FLUCONAZOLE 150 MG PO TABS: 150.0000 mg | ORAL_TABLET | Freq: Once | ORAL | 1 refills | Status: AC

## 2021-04-05 MED ORDER — ATENOLOL 50 MG PO TABS: 50.0000 mg | ORAL_TABLET | Freq: Every day | ORAL | 3 refills | Status: DC

## 2021-04-05 NOTE — Telephone Encounter (Signed)
   Susan [name] please sch pt   Spoke with patient , BP 199/105, HR 78.  Checked at work with arm cuff and felt to be accurate.   She stopped hctz 12.5mg  for one week.   She has had periods of labile blood pressure in the past.   She has decreased sodium intake.  No decongestants, drug use. Taking losartan 100mg  qam and atenolol 25mg  qpm.   She has been on lisinopril which caused cough. She has been on amlodipine however not sure why she came off medication.  She has h/o tachycardia.  No h/o MI.   H/o COPD which well controlled.   Denies exertional chest pain or pressure, numbness or tingling radiating to left arm or jaw, palpitations, dizziness, frequent headaches, changes in vision, or shortness of breath.   She has vaginal itching. No increase in vaginal discharge.   BP Readings from Last 3 Encounters:  03/24/21 120/68  02/16/21 120/75  02/03/21 (!) 172/92   Plan:  Consulted with pharmacist in the setting of COPD, nonselective beta-blocker.  COPD is very well controlled.  We will go ahead and increase atenolol for this evening in particular as patient's pharmacy closes at 5 PM.  May consider trial of calcium channel blocker in the future. She will take atenolol 50mg  tonight Continue losartan 100mg  Start diflucan If she has any chest pain, shortness of breath headache or vision changes, she understands to call 911 or go to the nearest emergency Close follow up

## 2021-04-05 NOTE — Telephone Encounter (Signed)
Patient scheduled with Sharyn Lull & to have labs done [facility].

## 2021-04-05 NOTE — Telephone Encounter (Signed)
LMTCB for labs. 

## 2021-04-06 ENCOUNTER — Telehealth: Payer: Self-pay | Admitting: Family

## 2021-04-06 DIAGNOSIS — I10 Essential (primary) hypertension: Secondary | ICD-10-CM

## 2021-04-06 NOTE — Telephone Encounter (Signed)
Susan [name] with patient and she is calling us this afternoon with blood pressure readings. Please see note below   She is feeling well today BP this morning 199/106, HR 67 before taking medications.  She took atenolol 50mg  qhs  She has taken losartan 100mg  qam and the atenolol 25mg  again this morning and plans to take the 25mg  atenolol 25mg  tonight.  She has also taken  BP on phone 183/99, HR  62. She took medications one hour ago.   Denies exertional chest pain or pressure, numbness or tingling radiating to left arm or jaw, palpitations, dizziness, frequent headaches, changes in vision, or shortness of breath.   Plan We agreed to take BP again in couple of hours and if not coming down, we will consider changing from atenolol to carvedilol for better HTN control. Consider carvedilol ( Coreg CR) 20mg  PO qam.    We agreed and were hesitant to further escalate too quickly antihypertensives as it may take some time for blood pressure to respond from increase in atenolol;  I would want to avoid hypotension.  Counseled patient on importance of staying hypervigilant for any chest pain, shortness of breath headache or vision changes which would warrant calling 911 or going to the nearest emergency room

## 2021-04-07 MED ORDER — CARVEDILOL PHOSPHATE ER 20 MG PO CP24: 20.0000 mg | ORAL_CAPSULE | Freq: Every day | ORAL | 2 refills | Status: DC

## 2021-04-07 NOTE — Addendum Note (Signed)
Addended by: Burnard Hawthorne on: 04/07/2021 10:29 AM   Modules accepted: Orders

## 2021-04-07 NOTE — Telephone Encounter (Signed)
I spoke with patient & advised to STOP atenolol 50mg  & that Coreg 20mg  was sent. Pt verbalized understanding & know to go to ED if she developes CP, SOB, HA, vision changes, left arm pain, numbness or tingling. She will call if BP escalates or does not come down.

## 2021-04-07 NOTE — Telephone Encounter (Signed)
STOP atenolol 50mg  Start carvedilol ( Coreg CR) 20mg  PO qam. she may start today as long as she has NOT taken atenolol today. Continue losartan 100 mg  Please give medication a few days to gradually come down.  If Blood pressure continues to escalate, please let me know.  Certainly if she develops chest pain shortness of breath, headache or vision changes, left arm numbness she needs to call 911

## 2021-04-07 NOTE — Telephone Encounter (Signed)
I called patient & after taking the Atenolol 50mg  last night BP came down to 163/92 P70 at 8p. Then this morning patient checked again & BP was 178/103 P64.   Please advise?

## 2021-04-09 ENCOUNTER — Other Ambulatory Visit: Payer: Self-pay

## 2021-04-09 ENCOUNTER — Telehealth: Payer: Self-pay

## 2021-04-09 ENCOUNTER — Other Ambulatory Visit: Payer: Self-pay | Admitting: Internal Medicine

## 2021-04-09 MED ORDER — CARVEDILOL 6.25 MG PO TABS: 6.2500 mg | ORAL_TABLET | Freq: Two times a day (BID) | ORAL | 3 refills | Status: DC

## 2021-04-09 NOTE — Telephone Encounter (Signed)
I called patient to let her know Carvedilol BID was sent. She asked that I send to [facility] bc [facility] was closed. I have done so & [facility] confirmed that they got. Pt advised to bring insurance card.

## 2021-04-09 NOTE — Telephone Encounter (Signed)
Pt called and stated that Coreg CR 20mg  needed PA, which I am awaiting results. She said that taking the atenolol 50mg  BID has not been helping to get to goal. BP still 177/93. Pt is asymptomatic & has been. [facility] gave me the message they should respond with approval or denial. Given BP should the plain Coreg be prescribed BID, wait in PA outcome or continue Losartan 100mg  & Atenolol 50mg  BID?  I added you [name] bc Joycelyn Schmid has discussed patient with you.

## 2021-04-12 ENCOUNTER — Emergency Department: Payer: Medicare Other

## 2021-04-12 ENCOUNTER — Emergency Department
Admission: EM | Admit: 2021-04-12 | Discharge: 2021-04-12 | Disposition: A | Discharge status: Home / Self Care | Payer: Medicare Other | Attending: Emergency Medicine | Admitting: Emergency Medicine

## 2021-04-12 ENCOUNTER — Telehealth: Payer: Self-pay | Admitting: Adult Health

## 2021-04-12 ENCOUNTER — Encounter: Payer: Self-pay | Admitting: Emergency Medicine

## 2021-04-12 ENCOUNTER — Other Ambulatory Visit: Payer: Self-pay

## 2021-04-12 DIAGNOSIS — J45909 Unspecified asthma, uncomplicated: Secondary | ICD-10-CM | POA: Diagnosis not present

## 2021-04-12 DIAGNOSIS — Z96653 Presence of artificial knee joint, bilateral: Secondary | ICD-10-CM | POA: Insufficient documentation

## 2021-04-12 DIAGNOSIS — R519 Headache, unspecified: Secondary | ICD-10-CM | POA: Diagnosis present

## 2021-04-12 DIAGNOSIS — Z79899 Other long term (current) drug therapy: Secondary | ICD-10-CM | POA: Diagnosis not present

## 2021-04-12 DIAGNOSIS — F17210 Nicotine dependence, cigarettes, uncomplicated: Secondary | ICD-10-CM | POA: Insufficient documentation

## 2021-04-12 DIAGNOSIS — E039 Hypothyroidism, unspecified: Secondary | ICD-10-CM | POA: Insufficient documentation

## 2021-04-12 DIAGNOSIS — I10 Essential (primary) hypertension: Secondary | ICD-10-CM | POA: Diagnosis not present

## 2021-04-12 LAB — CBC WITH DIFFERENTIAL/PLATELET
Abs Immature Granulocytes: 0.03 10*3/uL (ref 0.00–0.07)
Basophils Absolute: 0.1 10*3/uL (ref 0.0–0.1)
Basophils Relative: 1 %
Eosinophils Absolute: 0.5 10*3/uL (ref 0.0–0.5)
Eosinophils Relative: 5 %
HCT: 39.9 % (ref 36.0–46.0)
Hemoglobin: 13.7 g/dL (ref 12.0–15.0)
Immature Granulocytes: 0 %
Lymphocytes Relative: 33 %
Lymphs Abs: 3.5 10*3/uL (ref 0.7–4.0)
MCH: 33 pg (ref 26.0–34.0)
MCHC: 34.3 g/dL (ref 30.0–36.0)
MCV: 96.1 fL (ref 80.0–100.0)
Monocytes Absolute: 0.7 10*3/uL (ref 0.1–1.0)
Monocytes Relative: 6 %
Neutro Abs: 5.8 10*3/uL (ref 1.7–7.7)
Neutrophils Relative %: 55 %
Platelets: 220 10*3/uL (ref 150–400)
RBC: 4.15 MIL/uL (ref 3.87–5.11)
RDW: 12 % (ref 11.5–15.5)
WBC: 10.6 10*3/uL — ABNORMAL HIGH (ref 4.0–10.5)
nRBC: 0 % (ref 0.0–0.2)

## 2021-04-12 LAB — COMPREHENSIVE METABOLIC PANEL
ALT: 17 U/L (ref 0–44)
AST: 16 U/L (ref 15–41)
Albumin: 4.1 g/dL (ref 3.5–5.0)
Alkaline Phosphatase: 66 U/L (ref 38–126)
Anion gap: 6 (ref 5–15)
BUN: 9 mg/dL (ref 6–20)
CO2: 28 mmol/L (ref 22–32)
Calcium: 9.1 mg/dL (ref 8.9–10.3)
Chloride: 103 mmol/L (ref 98–111)
Creatinine, Ser: 0.77 mg/dL (ref 0.44–1.00)
GFR, Estimated: >60 mL/min (ref >60)
Glucose, Bld: 91 mg/dL (ref 70–99)
Potassium: 3.3 mmol/L — ABNORMAL LOW (ref 3.5–5.1)
Sodium: 137 mmol/L (ref 135–145)
Total Bilirubin: 0.4 mg/dL (ref 0.3–1.2)
Total Protein: 7.2 g/dL (ref 6.5–8.1)

## 2021-04-12 MED ORDER — LOSARTAN POTASSIUM 50 MG PO TABS
100.0000 mg | ORAL_TABLET | Freq: Once | ORAL | Status: AC
  Administered 2021-04-12: 100 mg via ORAL
  Filled 2021-04-12: qty 2

## 2021-04-12 MED ORDER — CLONIDINE HCL 0.1 MG PO TABS
0.1000 mg | ORAL_TABLET | Freq: Once | ORAL | Status: AC
  Administered 2021-04-12: 0.1 mg via ORAL
  Filled 2021-04-12: qty 1

## 2021-04-12 MED ORDER — CARVEDILOL 6.25 MG PO TABS
6.2500 mg | ORAL_TABLET | Freq: Once | ORAL | Status: AC
  Administered 2021-04-12: 6.25 mg via ORAL
  Filled 2021-04-12: qty 1

## 2021-04-12 MED ORDER — CARVEDILOL 6.25 MG PO TABS: 6.2500 mg | ORAL_TABLET | Freq: Two times a day (BID) | ORAL | Status: DC

## 2021-04-12 NOTE — Telephone Encounter (Signed)
Patient returned office phone call. 

## 2021-04-12 NOTE — ED Notes (Signed)
Patient states she has two more B/P meds to take at home. Patient  states she is ready to go home since she works nights and hasn't slept today.

## 2021-04-12 NOTE — ED Triage Notes (Signed)
Pt via POV from home. Pt c/o HTN and headache. Pt states that she was recently taken off hydrochlorothiazide and placed on carvedilol on Sunday. Pt is A&OX4 and NAD

## 2021-04-12 NOTE — Telephone Encounter (Signed)
LMTCB

## 2021-04-12 NOTE — Telephone Encounter (Signed)
Patient called and stated this morning her BP was 199/95. BP has been running high with new medication that Arnett prescribed on 04/09/2021. Patient was told to let Arnett know if she was having any issues.

## 2021-04-12 NOTE — Telephone Encounter (Signed)
Spoken to patient her current BP is 190/120 and BPM is 76. She is taking both medications, she has been taking the coreg since Saturday. Patient is not having any sx of SOB, Chest px, jaw px, arm px. She is having Sharp head ache with blurry vision. Instructed patient to go be evaluated. Patient stated she will have her daughter take her to be evaluated at an urgent care. She does not wish to go to ED.

## 2021-04-12 NOTE — Telephone Encounter (Signed)
Routing to [place] who is now [employer].

## 2021-04-12 NOTE — ED Provider Notes (Signed)
Emergency Medicine Provider Triage Evaluation Note  Susan [name] , a 60 y.o. female  was evaluated in triage.  Pt complains of headache, hypertension.  Patient states that she has been struggling controlling her hypertension, her primary care recently took her off her HCTZ due to setting of potassium issues.  Patient was started on carvedilol.  Patient is currently complaining of a severe headache that is global in nature.  No unilateral weakness.  No difficulty formulating thoughts or words.  Patient has noticed that her blood pressure has been elevated since starting her carvedilol.  Review of Systems  Positive: Headache, hypertension Negative: Fevers, chills, URI symptoms, chest pain, shortness of breath, GI symptoms.  Unilateral weakness or numbness  Physical Exam  BP (!) 190/106 (BP Location: Left Arm)   Pulse 80   Temp 97.9 F (36.6 C) (Oral)   Resp 18   Ht 5\' 2"  (1.575 m)   Wt 94.8 kg   SpO2 98%   BMI 38.23 kg/m  Gen:   Awake, no distress   Resp:  Normal effort  MSK:   Moves extremities without difficulty  Other:  Cranial nerves grossly intact time  Medical Decision Making  Medically screening exam initiated at 4:58 PM.  Appropriate orders placed.  Geana L Hartsough was informed that the remainder of the evaluation will be completed by another provider, this initial triage assessment does not replace that evaluation, and the importance of remaining in the ED until their evaluation is complete.  Patient presented for hypertension.  Recently transitioned from HCTZ to severe headache, hypertension at this time.  Neurologically intact at this time.  Patient will have basic labs, CT scan   Brynda Peon 04/12/21 1658    Harvest Dark, MD 04/12/21 2000

## 2021-04-12 NOTE — Telephone Encounter (Signed)
Call pt  Per notes, she was unable to get coreg CR so dr Derrel Nip sent in coreg 6.25mg  BID   Please ensure she is taking:   Coreg  6.25mg  BID Losartan 100mg   What is heart rate? How many days has she been on coreg 6.25mg  and has bp come down since starting?  Triage for exertional chest pain or pressure, numbness or tingling radiating to left arm or jaw, palpitations, dizziness, frequent headaches, changes in vision, or shortness of breath.   If she has any of the above, she will need to go ed.  If symptoms absent, I will likely increase her coreg.   Can she come in tomorrow at 12pm to see me for BP check? She will need to bring her BP cuff as well.  You may add on appointment

## 2021-04-13 ENCOUNTER — Telehealth: Payer: Self-pay

## 2021-04-13 ENCOUNTER — Other Ambulatory Visit: Payer: Self-pay | Admitting: Adult Health

## 2021-04-13 ENCOUNTER — Ambulatory Visit: Payer: Medicare Other | Admitting: Pharmacist

## 2021-04-13 DIAGNOSIS — I10 Essential (primary) hypertension: Secondary | ICD-10-CM

## 2021-04-13 DIAGNOSIS — I700 Atherosclerosis of aorta: Secondary | ICD-10-CM

## 2021-04-13 DIAGNOSIS — Z720 Tobacco use: Secondary | ICD-10-CM

## 2021-04-13 MED ORDER — TELMISARTAN 80 MG PO TABS: 80.0000 mg | ORAL_TABLET | Freq: Every day | ORAL | 2 refills | Status: DC

## 2021-04-13 MED ORDER — SPIRONOLACTONE 25 MG PO TABS: 12.5000 mg | ORAL_TABLET | Freq: Every day | ORAL | 2 refills | Status: DC

## 2021-04-13 NOTE — Progress Notes (Signed)
Orders Placed This Encounter  Procedures   Ambulatory referral to Cardiology    Referral Priority:   Routine    Referral Type:   Consultation    Referral Reason:   Specialty Services Required    Requested Specialty:   Cardiology    Number of Visits Requested:   1    Orders Placed This Encounter  Procedures   Ambulatory referral to Cardiology    Referral Priority:   Routine    Referral Type:   Consultation    Referral Reason:   Specialty Services Required    Requested Specialty:   Cardiology    Number of Visits Requested:   1   AMB Referral to [facility] Coordinaton    Referral Priority:   Routine    Referral Type:   Consultation    Referral Reason:   Care Coordination    Number of Visits Requested:   1

## 2021-04-13 NOTE — Patient Instructions (Signed)
Visit Information   Following is a copy of your full care plan:  Care Plan : Medication Management  Updates made by De Hollingshead, RPH-CPP since 04/13/2021 12:00 AM     Problem: Hypertension, Tobacco Use, COPD      Long-Range Goal: Disease Progression Prevention   Start Date: 04/13/2021  This Visit's Progress: On track  Priority: High  Note:   Current Barriers:  Unable to achieve control of blood pressure   Pharmacist Clinical Goal(s):  patient will achieve control of blood pressure asthrough collaboration with PharmD and provider.    Interventions: 1:1 collaboration with Flinchum, Kelby Aline, FNP regarding development and update of comprehensive plan of care as evidenced by provider attestation and co-signature Inter-disciplinary care team collaboration (see longitudinal plan of care) Comprehensive medication review performed; medication list updated in electronic medical record  Hypertension:   Uncontrolled; current treatment: losartan 100 mg daily, carvedilol 6.25 mg BID;  Current home readings: reports readings 190/110, HR 70s today. Reported ED was able to get BP down to ~160s with clonidine Denies hypertensive symptoms including chest pain, headaches today. Denies recent cocaine use, confirms sobriety since 2018. Does note she smokes ~ 3/4 ppd, is smoking while we talk today. Also notes significant intake of caffeine in tea  Reviewed chart, discussed with primary care provider. Switch losartan to telmisartan 80 mg daily for prolonged half life, longer duration of action. Continue carvedilol 6.25 mg BID. Start spironolactone 12.5 mg daily. Patient called back and her pharmacy does not have these two in stock, [facility].  BMP in 7 days, scheduled and ordered.  Advised to significantly limit caffeine and tobacco use as able. Discussed minimization of sodium intake. Discussed focusing on calming activities this evening instead of focusing on her blood pressure.    Hyperlipidemia:   Controlled; current treatment: atorvastatin 10 mg daily ;  Recommended to continue current regimen at this time  Chronic Obstructive Pulmonary Disease:   Uncontrolled;current treatment:Trelegy 100/62.5/25; albuterol HFA - though reports she stopped the Trelegy, as she feels like it makes her breathing worse. Has not required albuterol recently.  GOLD Classification:  MMRC/CAT score:  Most recent Pulmonary Function Testing:  Recommended to continue current regimen at this time. Will discuss therapy optimization moving forward  Bipolar Depression, pain:   Worsened anxiety due to blood pressure; current treatment: duloxetine 60 mg BID, Latuda 60 mg daily, gabepentin 600 mg BID, meloxicam 15 mg daily, hydrocodone/acetaminophen 5/325 mg PRN Recommended to continue current regimen    Patient Goals/Self-Care Activities patient will:  - take medications as prescribed as evidenced by patient report and record review check blood pressure once daily, document, and provide at future appointments      Consent to CCM Services: Susan [name] was given information about Chronic Care Management services including:  CCM service includes personalized support from designated clinical staff supervised by her physician, including individualized plan of care and coordination with other care providers 24/7 contact phone numbers for assistance for urgent and routine care needs. Service will only be billed when office clinical staff spend 20 minutes or more in a month to coordinate care. Only one practitioner may furnish and bill the service in a calendar month. The patient may stop CCM services at any time (effective at the end of the month) by phone call to the office staff. The patient will be responsible for cost sharing (co-pay) of up to 20% of the service fee (after annual deductible is met).  Patient agreed to  services and verbal consent obtained.   Plan: Telephone follow up  appointment with care management team member scheduled for:  2 days  [name] Darnelle Maffucci, PharmD, [place], CPP Clinical Pharmacist [place] at [facility] [phone]   Please call the care guide team at [phone] if you need to cancel or reschedule your appointment.   Patient verbalizes understanding of instructions provided today and agrees to view in [place].

## 2021-04-13 NOTE — Chronic Care Management (AMB) (Signed)
  Chronic Care Management   Outreach Note  04/13/2021 Name: Susan [name] MRN: [id number] DOB: 30-Aug-1960  Susan [name] is a 60 y.o. year old female who is a primary care patient of Flinchum, Kelby Aline, FNP. I reached out to [facility] by phone today in response to a referral sent by Susan [name]'s primary care provider.  An unsuccessful telephone outreach was attempted today. The patient was referred to the case management team for assistance with care management and care coordination.   Follow Up Plan: A HIPAA compliant phone message was left for the patient providing contact information and requesting a return call.  The care management team will reach out to the patient again over the next 3 days.  If patient returns call to provider office, please advise to call [place] * at [phone]*  Susan [name], [place], [place] [facility]  [place], [place] [address] Direct Dial: [phone] [name]: [email or web address]

## 2021-04-13 NOTE — Chronic Care Management (AMB) (Signed)
Chronic Care Management CCM Pharmacy Note  04/13/2021 Name:  Susan [name] MRN:  [id number] DOB:  14-Feb-1961  Summary: - BP elevated.   Recommendations/Changes made from today's visit: - Discussed with PCP. Switch losartan to telmisartan 80 mg daily, start spironolactone 12.5 mg daily. Continue carvedilol 6.25 mg BID  Subjective: Susan [name] is an 60 y.o. year old female who is a primary patient of Flinchum, Kelby Aline, FNP.  The CCM team was consulted for assistance with disease management and care coordination needs.    Engaged with patient by telephone for initial visit for pharmacy case management and/or care coordination services.   Objective:  Medications Reviewed Today     Reviewed by De Hollingshead, RPH-CPP (Pharmacist) on 04/13/21 at 1453  Med List Status: <None>   Medication Order Taking? Sig Documenting Provider Last Dose Status Informant  ACCU-CHEK GUIDE test strip [id number]  TEST UP TO 4 TIMES A DAY Brita Romp Dionne Bucy, MD  Active   Accu-Chek Softclix Lancets lancets [id number]  TEST 4 TIMES A DAY Flinchum, Kelby Aline, FNP  Active   albuterol (VENTOLIN HFA) 108 (90 Base) MCG/ACT inhaler [id number] No Inhale 2 puffs into the lungs every 4 (four) hours as needed for wheezing or shortness of breath.  Patient not taking: Reported on 04/13/2021   Delsa Grana, PA-C Not Taking Active   atorvastatin (LIPITOR) 10 MG tablet [id number] Yes TAKE 1 TABLET (10 MG TOTAL) BY MOUTH AT BEDTIME. Flinchum, Kelby Aline, FNP Taking Active   B-D UF III MINI PEN NEEDLES 31G X 5 MM MISC [id number]  USE WITH OZEMPIC Flinchum, Kelby Aline, FNP  Active   blood glucose meter kit and supplies [id number]  Dispense based on patient and insurance preference. Use up to four times daily as directed. (FOR ICD-10 E10.9, E11.9). Flinchum, Kelby Aline, FNP  Active   carvedilol (COREG) 6.25 MG tablet [id number] Yes Take 1 tablet (6.25 mg total) by mouth 2 (two) times daily with a meal. Crecencio Mc, MD  Taking Active   clindamycin (CLEOCIN T) 1 % external solution [id number] No Apply to aa's scalp QD PRN  Patient not taking: Reported on 04/13/2021   Ralene Bathe, MD Not Taking Active   clotrimazole (LOTRIMIN) 1 % cream [id number] No Apply 1 application topically 2 (two) times daily.  Patient not taking: Reported on 04/13/2021   Delsa Grana, PA-C Not Taking Active            Med Note [facility], Wilder Glade Jan 27, 2020  9:11 AM) Prn   cyclobenzaprine (FLEXERIL) 5 MG tablet [id number] Yes Take 5 mg by mouth 2 (two) times daily as needed for muscle spasms.  [provider] Taking Active Self           Med Note De Hollingshead   Tue Apr 13, 2021  2:51 PM) 10 mg QPM  DULoxetine (CYMBALTA) 60 MG capsule [id number] Yes TAKE 1 CAPSULE (60 MG TOTAL) BY MOUTH 2 (TWO) TIMES DAILY. Ursula Alert, MD Taking Active   Fluticasone-Umeclidin-Vilant (TRELEGY ELLIPTA) 100-62.5-25 MCG/INH AEPB [id number] No Inhale 1 puff into the lungs daily.  Patient not taking: Reported on 04/13/2021   Delsa Grana, PA-C Not Taking Active Self  gabapentin (NEURONTIN) 300 MG capsule [id number] Yes Take 600 mg by mouth 2 (two) times daily.  [provider] Taking Active Self           Med Note Sharene Butters   Fri Apr 22, 2016 12:30 PM)  HYDROcodone-acetaminophen (NORCO/VICODIN) 5-325 MG tablet [id number] No 1 po qday prn.  Patient not taking: Reported on 04/13/2021   [provider] Not Taking Active   Lancet Devices (TRUEDRAW LANCING DEVICE) [place] [id number]   [provider]  Active   LATUDA 60 MG TABS [id number] No TAKE 1 TABLET BY MOUTH DAILY WITH SUPPER  Patient not taking: Reported on 04/13/2021   Ursula Alert, MD Not Taking Active   losartan (COZAAR) 100 MG tablet [id number] Yes TAKE 1 TABLET (100 MG TOTAL) BY MOUTH DAILY. Flinchum, Kelby Aline, FNP Taking Active   meloxicam (MOBIC) 15 MG tablet [id number] Yes Take by mouth. [provider] Taking Active    omeprazole (PRILOSEC) 20 MG capsule [id number] No Take 1 capsule (20 mg total) by mouth daily as needed. Take 30 mins to 1 hr before breakfast  Patient not taking: Reported on 04/13/2021   Burnard Hawthorne, FNP Not Taking Active   Semaglutide,0.25 or 0.5MG/DOS, (OZEMPIC, 0.25 OR 0.5 MG/DOSE,) 2 MG/1.5ML SOPN [id number] Yes Inject 0.5 mg into the skin once a week. Burnard Hawthorne, FNP Taking Active   vitamin B-12 (CYANOCOBALAMIN) 1000 MCG tablet [id number] Yes Take 1,000 mcg by mouth daily.  [provider] Taking Active Self            Pertinent Labs:   Lab Results  Component Value Date   HGBA1C 5.5 02/03/2021   Lab Results  Component Value Date   CHOL 151 02/10/2021   HDL 55.20 02/10/2021   LDLCALC 71 02/10/2021   TRIG 124.0 02/10/2021   CHOLHDL 3 02/10/2021   Lab Results  Component Value Date   CREATININE 0.77 04/12/2021   BUN 9 04/12/2021   NA 137 04/12/2021   K 3.3 (L) 04/12/2021   CL 103 04/12/2021   CO2 28 04/12/2021    SDOH:  (Social Determinants of Health) assessments and interventions performed:  SDOH Interventions    Flowsheet Row Most Recent Value  SDOH Interventions   Financial Strain Interventions Intervention Not Indicated       CCM Care Plan  Review of patient past medical history, allergies, medications, health status, including review of consultants reports, laboratory and other test data, was performed as part of comprehensive evaluation and provision of chronic care management services.   Care Plan : Medication Management  Updates made by De Hollingshead, RPH-CPP since 04/13/2021 12:00 AM     Problem: Hypertension, Tobacco Use, COPD      Long-Range Goal: Disease Progression Prevention   Start Date: 04/13/2021  This Visit's Progress: On track  Priority: High  Note:   Current Barriers:  Unable to achieve control of blood pressure   Pharmacist Clinical Goal(s):  patient will achieve control of blood pressure asthrough  collaboration with PharmD and provider.    Interventions: 1:1 collaboration with Flinchum, Kelby Aline, FNP regarding development and update of comprehensive plan of care as evidenced by provider attestation and co-signature Inter-disciplinary care team collaboration (see longitudinal plan of care) Comprehensive medication review performed; medication list updated in electronic medical record  Hypertension:   Uncontrolled; current treatment: losartan 100 mg daily, carvedilol 6.25 mg BID;  Current home readings: reports readings 190/110, HR 70s today. Reported ED was able to get BP down to ~160s with clonidine Denies hypertensive symptoms including chest pain, headaches today. Denies recent cocaine use, confirms sobriety since 2018. Does note she smokes ~ 3/4 ppd, is smoking while we talk today. Also notes significant intake of caffeine in tea  Reviewed chart, discussed  with primary care provider. Switch losartan to telmisartan 80 mg daily for prolonged half life, longer duration of action. Continue carvedilol 6.25 mg BID. Start spironolactone 12.5 mg daily. Patient called back and her pharmacy does not have these two in stock, [facility]  BMP in 7 days, scheduled and ordered.  Advised to significantly limit caffeine and tobacco use as able. Discussed minimization of sodium intake. Discussed focusing on calming activities this evening instead of focusing on her blood pressure.   Hyperlipidemia:   Controlled; current treatment: atorvastatin 10 mg daily ;  Recommended to continue current regimen at this time  Chronic Obstructive Pulmonary Disease:   Uncontrolled;current treatment:Trelegy 100/62.5/25; albuterol HFA - though reports she stopped the Trelegy, as she feels like it makes her breathing worse. Has not required albuterol recently.  GOLD Classification:  MMRC/CAT score:  Most recent Pulmonary Function Testing:  Recommended to continue current regimen at this time. Will  discuss therapy optimization moving forward  Bipolar Depression, pain:   Worsened anxiety due to blood pressure; current treatment: duloxetine 60 mg BID, Latuda 60 mg daily, gabepentin 600 mg BID, meloxicam 15 mg daily, hydrocodone/acetaminophen 5/325 mg PRN Recommended to continue current regimen    Patient Goals/Self-Care Activities patient will:  - take medications as prescribed as evidenced by patient report and record review check blood pressure once daily, document, and provide at future appointments       Plan: Telephone follow up appointment with care management team member scheduled for:  2 days  [name] Darnelle Maffucci, PharmD, [place], [place] Clinical Pharmacist [employer] at [employer] [phone]

## 2021-04-13 NOTE — Chronic Care Management (AMB) (Signed)
  Chronic Care Management   Note  04/13/2021 Name: Susan [name] MRN: [id number] DOB: [birth year]  [name] is a 60 y.o. year old female who is a primary care patient of Flinchum, Kelby Aline, FNP. I reached out to [facility] by phone today in response to a referral sent by Ms. Avon PCP.  Ms. Ouellet was given information about Chronic Care Management services today including:  CCM service includes personalized support from designated clinical staff supervised by her physician, including individualized plan of care and coordination with other care providers 24/7 contact phone numbers for assistance for urgent and routine care needs. Service will only be billed when office clinical staff spend 20 minutes or more in a month to coordinate care. Only one practitioner may furnish and bill the service in a calendar month. The patient may stop CCM services at any time (effective at the end of the month) by phone call to the office staff. The patient is responsible for co-pay (up to 20% after annual deductible is met) if co-pay is required by the individual health plan.   Patient agreed to services and verbal consent obtained.   Follow up plan: Telephone appointment with care management team member scheduled for:04/13/2021  Noreene Larsson, [place], [place], [place] [address] Direct Dial: [phone] [email or web address]: [email or web address]

## 2021-04-16 ENCOUNTER — Ambulatory Visit: Payer: Medicare Other | Admitting: Pharmacist

## 2021-04-16 ENCOUNTER — Other Ambulatory Visit: Payer: Self-pay

## 2021-04-16 ENCOUNTER — Telehealth: Payer: Self-pay | Admitting: Pharmacist

## 2021-04-16 DIAGNOSIS — I10 Essential (primary) hypertension: Secondary | ICD-10-CM

## 2021-04-16 DIAGNOSIS — I700 Atherosclerosis of aorta: Secondary | ICD-10-CM

## 2021-04-16 DIAGNOSIS — F319 Bipolar disorder, unspecified: Secondary | ICD-10-CM

## 2021-04-16 MED ORDER — CARVEDILOL 12.5 MG PO TABS: 12.5000 mg | ORAL_TABLET | Freq: Two times a day (BID) | ORAL | 2 refills | Status: DC

## 2021-04-16 MED ORDER — CARVEDILOL 6.25 MG PO TABS: 6.2500 mg | ORAL_TABLET | Freq: Two times a day (BID) | ORAL | 3 refills | Status: DC

## 2021-04-16 NOTE — Telephone Encounter (Signed)
Returned call. See CCM documentation 

## 2021-04-16 NOTE — Chronic Care Management (AMB) (Signed)
Chronic Care Management CCM Pharmacy Note  04/16/2021 Name:  Susan [name] MRN:  [id number] DOB:  1961/05/09  Summary: - BP still elevated, readings today 190-200s/100s, HR 80s. Mild headache, no other symptoms  Recommendations/Changes made from today's visit: - Increase carvedilol to 12.5 mg BID. Follow up on Monday  Subjective: Susan [name] is an 60 y.o. year old female who is a primary patient of Flinchum, Kelby Aline, FNP.  The CCM team was consulted for assistance with disease management and care coordination needs.    Engaged with patient by telephone for follow up visit for pharmacy case management and/or care coordination services.   Objective:  Medications Reviewed Today     Reviewed by De Hollingshead, RPH-CPP (Pharmacist) on 04/13/21 at 1453  Med List Status: <None>   Medication Order Taking? Sig Documenting Provider Last Dose Status Informant  ACCU-CHEK GUIDE test strip [id number]  TEST UP TO 4 TIMES A DAY Brita Romp Dionne Bucy, MD  Active   Accu-Chek Softclix Lancets lancets [id number]  TEST 4 TIMES A DAY Flinchum, Kelby Aline, FNP  Active   albuterol (VENTOLIN HFA) 108 (90 Base) MCG/ACT inhaler [id number] No Inhale 2 puffs into the lungs every 4 (four) hours as needed for wheezing or shortness of breath.  Patient not taking: Reported on 04/13/2021   Delsa Grana, PA-C Not Taking Active   atorvastatin (LIPITOR) 10 MG tablet [id number] Yes TAKE 1 TABLET (10 MG TOTAL) BY MOUTH AT BEDTIME. Flinchum, Kelby Aline, FNP Taking Active   B-D UF III MINI PEN NEEDLES 31G X 5 MM MISC [id number]  USE WITH OZEMPIC Flinchum, Kelby Aline, FNP  Active   blood glucose meter kit and supplies [id number]  Dispense based on patient and insurance preference. Use up to four times daily as directed. (FOR ICD-10 E10.9, E11.9). Flinchum, Kelby Aline, FNP  Active   carvedilol (COREG) 6.25 MG tablet [id number] Yes Take 1 tablet (6.25 mg total) by mouth 2 (two) times daily with a meal. Crecencio Mc,  MD Taking Active   clindamycin (CLEOCIN T) 1 % external solution [id number] No Apply to aa's scalp QD PRN  Patient not taking: Reported on 04/13/2021   Ralene Bathe, MD Not Taking Active   clotrimazole (LOTRIMIN) 1 % cream [id number] No Apply 1 application topically 2 (two) times daily.  Patient not taking: Reported on 04/13/2021   Delsa Grana, PA-C Not Taking Active            Med Note [facility], Wilder Glade Jan 27, 2020  9:11 AM) Prn   cyclobenzaprine (FLEXERIL) 5 MG tablet [id number] Yes Take 5 mg by mouth 2 (two) times daily as needed for muscle spasms.  [provider] Taking Active Self           Med Note De Hollingshead   Tue Apr 13, 2021  2:51 PM) 10 mg QPM  DULoxetine (CYMBALTA) 60 MG capsule [id number] Yes TAKE 1 CAPSULE (60 MG TOTAL) BY MOUTH 2 (TWO) TIMES DAILY. Ursula Alert, MD Taking Active   Fluticasone-Umeclidin-Vilant (TRELEGY ELLIPTA) 100-62.5-25 MCG/INH AEPB [id number] No Inhale 1 puff into the lungs daily.  Patient not taking: Reported on 04/13/2021   Delsa Grana, PA-C Not Taking Active Self  gabapentin (NEURONTIN) 300 MG capsule [id number] Yes Take 600 mg by mouth 2 (two) times daily.  [provider] Taking Active Self           Med Note Sharene Butters   Fri Apr 22, 2016 12:30  PM)    HYDROcodone-acetaminophen (NORCO/VICODIN) 5-325 MG tablet [id number] No 1 po qday prn.  Patient not taking: Reported on 04/13/2021   [provider] Not Taking Active   Lancet Devices (TRUEDRAW LANCING DEVICE) [address] [id number]   [provider]  Active   LATUDA 60 MG TABS [id number] No TAKE 1 TABLET BY MOUTH DAILY WITH SUPPER  Patient not taking: Reported on 04/13/2021   Ursula Alert, MD Not Taking Active   losartan (COZAAR) 100 MG tablet [id number] Yes TAKE 1 TABLET (100 MG TOTAL) BY MOUTH DAILY. Flinchum, Kelby Aline, FNP Taking Active   meloxicam (MOBIC) 15 MG tablet [id number] Yes Take by mouth. [provider] Taking Active    omeprazole (PRILOSEC) 20 MG capsule [id number] No Take 1 capsule (20 mg total) by mouth daily as needed. Take 30 mins to 1 hr before breakfast  Patient not taking: Reported on 04/13/2021   Burnard Hawthorne, FNP Not Taking Active   Semaglutide,0.25 or 0.5MG/DOS, (OZEMPIC, 0.25 OR 0.5 MG/DOSE,) 2 MG/1.5ML SOPN [id number] Yes Inject 0.5 mg into the skin once a week. Burnard Hawthorne, FNP Taking Active   vitamin B-12 (CYANOCOBALAMIN) 1000 MCG tablet [id number] Yes Take 1,000 mcg by mouth daily.  [provider] Taking Active Self            Pertinent Labs:   Lab Results  Component Value Date   HGBA1C 5.5 02/03/2021   Lab Results  Component Value Date   CHOL 151 02/10/2021   HDL 55.20 02/10/2021   LDLCALC 71 02/10/2021   TRIG 124.0 02/10/2021   CHOLHDL 3 02/10/2021   Lab Results  Component Value Date   CREATININE 0.77 04/12/2021   BUN 9 04/12/2021   NA 137 04/12/2021   K 3.3 (L) 04/12/2021   CL 103 04/12/2021   CO2 28 04/12/2021    SDOH:  (Social Determinants of Health) assessments and interventions performed:  SDOH Interventions    Flowsheet Row Most Recent Value  SDOH Interventions   Financial Strain Interventions Intervention Not Indicated       CCM Care Plan  Review of patient past medical history, allergies, medications, health status, including review of consultants reports, laboratory and other test data, was performed as part of comprehensive evaluation and provision of chronic care management services.   Care Plan : Medication Management  Updates made by De Hollingshead, RPH-CPP since 04/16/2021 12:00 AM     Problem: Hypertension, Tobacco Use, COPD      Long-Range Goal: Disease Progression Prevention   Start Date: 04/13/2021  Recent Progress: On track  Priority: High  Note:   Current Barriers:  Unable to achieve control of blood pressure   Pharmacist Clinical Goal(s):  patient will achieve control of blood pressure asthrough  collaboration with PharmD and provider.    Interventions: 1:1 collaboration with Flinchum, Kelby Aline, FNP regarding development and update of comprehensive plan of care as evidenced by provider attestation and co-signature Inter-disciplinary care team collaboration (see longitudinal plan of care) Comprehensive medication review performed; medication list updated in electronic medical record  Hypertension:   Uncontrolled; current treatment: valsartan 80 mg, carvedilol 6.25 mg BID; spironolactone 12.5 mg daily  Current home readings:  BP this morning before medications 188/110, HR 80; today after work 207/122, HR 88; Smoked 5 cigarettes today, reports she had smoked right before her most recent reading Does note a mild headache today. Denies chest pain, visual changes.  Discussed with Dr. Derrel Nip. Increase carvedilol to 12.5 mg twice daily. Patient  will use the supply she currently has. Continue valsartan 80 mg daily, spironolactone 12.5 mg daily.  Moving forward, titrate carvedilol (pending HR) vs titrate spironolactone (pending BMP) vs add amlodipine. Patient does note a history of amlodipine, any denies remembering having issues with swelling.  Reviewed warning signs to seek ED evaluation. Patient verbalized understanding.   Hyperlipidemia:   Controlled; current treatment: atorvastatin 10 mg daily  Previously recommended to continue current regimen at this time  Chronic Obstructive Pulmonary Disease:   Uncontrolled;current treatment:Trelegy 100/62.5/25; albuterol HFA - though reports she stopped the Trelegy, as she feels like it makes her breathing worse. Has not required albuterol recently.  Previously recommended to continue current regimen at this time. Will discuss therapy optimization moving forward  Bipolar Depression, pain:   Worsened anxiety due to blood pressure; current treatment: duloxetine 60 mg BID, Latuda 60 mg daily, gabepentin 600 mg BID, meloxicam 15 mg daily,  hydrocodone/acetaminophen 5/325 mg PRN Previously recommended to continue current regimen    Patient Goals/Self-Care Activities patient will:  - take medications as prescribed as evidenced by patient report and record review check blood pressure once daily, document, and provide at future appointments       Plan: Telephone follow up appointment with care management team member scheduled for:  4 days  [name] Darnelle Maffucci, PharmD, [place], [place] Clinical Pharmacist [employer] [facility] [employer] [phone]

## 2021-04-16 NOTE — Patient Instructions (Signed)
Visit Information  Following are the goals we discussed today:    Patient Goals/Self-Care Activities patient will:  - take medications as prescribed as evidenced by patient report and record review check blood pressure once daily, document, and provide at future appointments          Plan: Telephone follow up appointment with care management team member scheduled for:  4 days   [name] Darnelle Maffucci, PharmD, [place], CPP Clinical Pharmacist [place] [facility] [phone]     Please call the care guide team at [phone] if you need to cancel or reschedule your appointment.   Patient verbalizes understanding of instructions provided today and agrees to view in [place].

## 2021-04-16 NOTE — Telephone Encounter (Signed)
Patient called in she advise that she was returning a callback,  Requesting a callback

## 2021-04-16 NOTE — Telephone Encounter (Signed)
Called patient for blood pressure follow up. Left message for her to return my call at her convenience.

## 2021-04-19 ENCOUNTER — Other Ambulatory Visit: Payer: Self-pay | Admitting: *Deleted

## 2021-04-19 ENCOUNTER — Other Ambulatory Visit: Payer: Self-pay | Admitting: Family Medicine

## 2021-04-19 ENCOUNTER — Ambulatory Visit (INDEPENDENT_AMBULATORY_CARE_PROVIDER_SITE_OTHER): Payer: Medicare Other | Admitting: Psychology

## 2021-04-19 ENCOUNTER — Other Ambulatory Visit: Payer: Self-pay | Admitting: Psychiatry

## 2021-04-19 DIAGNOSIS — F3131 Bipolar disorder, current episode depressed, mild: Secondary | ICD-10-CM | POA: Diagnosis not present

## 2021-04-19 DIAGNOSIS — F3289 Other specified depressive episodes: Secondary | ICD-10-CM

## 2021-04-19 DIAGNOSIS — F17210 Nicotine dependence, cigarettes, uncomplicated: Secondary | ICD-10-CM

## 2021-04-19 NOTE — ED Provider Notes (Signed)
[facility]-EMERGENCY DEPARTMENT  ____________________________________________  Time seen: Approximately 4:42 PM  I have reviewed the triage vital signs and the nursing notes.   HISTORY  Chief Complaint Hypertension and Headache   Historian Patient    HPI Susan [name] is a 60 y.o. female presents to the emergency department with headache and elevated blood pressure at home.  Patient reports that she is uncertain whether she took her blood pressure medication this morning.  She recently stopped hydrochlorothiazide and takes losartan and carvedilol.  Patient states that at she has seemed more stressed today as she did not sleep last night.  She denies chest pain, chest tightness or abdominal pain.   Past Medical History:  Diagnosis Date   ADD (attention deficit disorder)    Alcohol abuse    Anemia    Anxiety    Aortic atherosclerosis ([place]) 02/20/2018   Chest CT Sept 2019   Arthritis    rheumatoid arthritis   Asthma    Bipolar disorder ([place])    Centrilobular emphysema ([place]) 02/20/2018   Chest CT Sept 2019   Constipation    Degenerative disc disease at L5-S1 level 09/28/2016   See ortho note May 2018   Depression    bipolar, hx of suicide attempt   Drug use    Dyspnea    with exertion   GERD (gastroesophageal reflux disease)    H/O suicide attempt    slit wrists   Hepatitis C 06/26/2014   Hip pain    History of alcohol abuse    History of diverticulitis 2013   History of hepatitis C    HEP "C"--three years ago   History of MRSA infection 2013   HLD (hyperlipidemia)    Hypertension    Hypothyroidism    Incisional hernia 11/08/2012   Knee pain    MRSA carrier Nov 2013   Multinodular thyroid    OSA (obstructive sleep apnea)    managed by Dr. Manuella Ghazi   Osteoporosis    Post-traumatic osteoarthritis of right knee 09/24/2015   Prediabetes    Recurrent ventral hernia 11/08/2012   Sleep apnea     Dx 3 years ago. Use C-PAP   Status post total right knee replacement using  cement 05/10/2016   Thyroid disease    Goiter   Vitamin B12 deficiency    Vitamin D deficiency disease      Immunizations up to date:  Yes.     Past Medical History:  Diagnosis Date   ADD (attention deficit disorder)    Alcohol abuse    Anemia    Anxiety    Aortic atherosclerosis ([place]) 02/20/2018   Chest CT Sept 2019   Arthritis    rheumatoid arthritis   Asthma    Bipolar disorder ([place])    Centrilobular emphysema ([place]) 02/20/2018   Chest CT Sept 2019   Constipation    Degenerative disc disease at L5-S1 level 09/28/2016   See ortho note May 2018   Depression    bipolar, hx of suicide attempt   Drug use    Dyspnea    with exertion   GERD (gastroesophageal reflux disease)    H/O suicide attempt    slit wrists   Hepatitis C 06/26/2014   Hip pain    History of alcohol abuse    History of diverticulitis 2013   History of hepatitis C    HEP "C"--three years ago   History of MRSA infection 2013   HLD (hyperlipidemia)    Hypertension  Hypothyroidism    Incisional hernia 11/08/2012   Knee pain    MRSA carrier Nov 2013   Multinodular thyroid    OSA (obstructive sleep apnea)    managed by Dr. Manuella Ghazi   Osteoporosis    Post-traumatic osteoarthritis of right knee 09/24/2015   Prediabetes    Recurrent ventral hernia 11/08/2012   Sleep apnea     Dx 3 years ago. Use C-PAP   Status post total right knee replacement using cement 05/10/2016   Thyroid disease    Goiter   Vitamin B12 deficiency    Vitamin D deficiency disease     Patient Active Problem List   Diagnosis Date Noted   Bipolar disorder, in full remission, most recent episode depressed ([place]) 02/05/2021   Mild depressed bipolar 1 disorder ([place]) 09/02/2020   Nervousness 05/28/2020   Anxiety 05/28/2020   Bipolar affective disorder in remission ([place]) 05/28/2020   Cough 03/18/2020   Bipolar disorder, in full remission, most recent episode mixed ([place]) 03/06/2020   Need for immunization against influenza 01/27/2020    Routine physical examination 01/27/2020   Fatigue 01/27/2020   Morbid obesity ([place]) 01/27/2020   Osteoporosis 11/06/2019   Bipolar 1 disorder, mixed, mild ([place]) 10/29/2019   PTSD (post-traumatic stress disorder) 09/27/2019   Bereavement 09/27/2019   Cocaine use disorder, moderate, in sustained remission ([place]) 09/27/2019   Status post total hip replacement, left 07/23/2019   Primary osteoarthritis of left hip 06/14/2019   Insulin resistance 02/18/2019   Prediabetes 12/31/2018   Depression 10/23/2018   Class 3 severe obesity with serious comorbidity and body mass index (BMI) of 40.0 to 44.9 in adult ([place]) 10/23/2018   Allergic rhinitis 08/23/2018   Aortic atherosclerosis ([place]) 02/20/2018   Centrilobular emphysema ([place]) 02/20/2018   Dyslipidemia 03/28/2017   Degenerative disc disease at L5-S1 level 09/28/2016   Tobacco use disorder 04/21/2016   GERD (gastroesophageal reflux disease) 12/17/2015   Chronic back pain 10/09/2015   Post-traumatic osteoarthritis of right knee 09/24/2015   Alcohol use disorder, severe, in sustained remission ([place]) 04/13/2015   Vitamin D deficiency 04/09/2015   Vitamin B12 deficiency 04/09/2015   OSA on CPAP 04/09/2015   Subclinical hyperthyroidism 06/26/2014   Toxic multinodular goiter 06/26/2014   Essential hypertension 06/26/2014   Tobacco use disorder, continuous 06/26/2014   Bipolar I disorder ([place]) 06/26/2014   Lumbar radiculitis 10/03/2013   Diverticulosis 08/24/2013    Past Surgical History:  Procedure Laterality Date   BILATERAL SALPINGOOPHORECTOMY     due to abnormal mass   BREAST BIOPSY Left    neg   BREAST SURGERY Left 20 yrs ago   CESAREAN SECTION     COLONOSCOPY     COLONOSCOPY WITH PROPOFOL N/A 10/16/2017   Procedure: COLONOSCOPY WITH PROPOFOL;  Surgeon: Jonathon Bellows, MD;  Location: [facility] ENDOSCOPY;  Service: Gastroenterology;  Laterality: N/A;   COLONOSCOPY WITH PROPOFOL N/A 02/16/2021   Procedure: COLONOSCOPY WITH PROPOFOL;  Surgeon:  Jonathon Bellows, MD;  Location: [facility] ENDOSCOPY;  Service: Gastroenterology;  Laterality: N/A;   HERNIA REPAIR  06/2011, July 2014   Ventral wall repair with Physiomesh   HERNIA REPAIR     2nd.vental wall repair   JOINT REPLACEMENT Right    knee   TONSILLECTOMY     TOTAL HIP ARTHROPLASTY Left 07/23/2019   Procedure: TOTAL HIP ARTHROPLASTY;  Surgeon: Corky Mull, MD;  Location: [facility] ORS;  Service: Orthopedics;  Laterality: Left;   TOTAL KNEE ARTHROPLASTY Right 05/10/2016   Procedure: TOTAL KNEE ARTHROPLASTY;  Surgeon:  Corky Mull, MD;  Location: [facility] ORS;  Service: Orthopedics;  Laterality: Right;   TUBAL LIGATION      Prior to Admission medications   Medication Sig Start Date End Date Taking? Authorizing Provider  ACCU-CHEK GUIDE test strip TEST UP TO 4 TIMES A DAY 03/04/21   Bacigalupo, Dionne Bucy, MD  Accu-Chek Softclix Lancets lancets TEST 4 TIMES A DAY 09/29/20   Flinchum, Kelby Aline, FNP  albuterol (VENTOLIN HFA) 108 (90 Base) MCG/ACT inhaler Inhale 2 puffs into the lungs every 4 (four) hours as needed for wheezing or shortness of breath. Patient not taking: Reported on 04/13/2021 08/21/19   Delsa Grana, PA-C  atorvastatin (LIPITOR) 10 MG tablet TAKE 1 TABLET (10 MG TOTAL) BY MOUTH AT BEDTIME. 08/10/20   Flinchum, Kelby Aline, FNP  B-D UF III MINI PEN NEEDLES 31G X 5 MM MISC USE WITH OZEMPIC 03/09/21   Flinchum, Kelby Aline, FNP  blood glucose meter kit and supplies Dispense based on patient and insurance preference. Use up to four times daily as directed. (FOR ICD-10 E10.9, E11.9). 05/28/20   Flinchum, Kelby Aline, FNP  carvedilol (COREG) 12.5 MG tablet Take 1 tablet (12.5 mg total) by mouth 2 (two) times daily with a meal. 04/16/21   Crecencio Mc, MD  clindamycin (CLEOCIN T) 1 % external solution Apply to aa's scalp QD PRN Patient not taking: Reported on 04/13/2021 11/18/19   Ralene Bathe, MD  clotrimazole (LOTRIMIN) 1 % cream Apply 1 application topically 2 (two) times daily. Patient not  taking: Reported on 04/13/2021 08/21/19   Delsa Grana, PA-C  cyclobenzaprine (FLEXERIL) 5 MG tablet Take 5 mg by mouth 2 (two) times daily as needed for muscle spasms.  06/04/15   [provider]  DULoxetine (CYMBALTA) 60 MG capsule TAKE 1 CAPSULE (60 MG TOTAL) BY MOUTH 2 (TWO) TIMES DAILY. 01/21/21   Ursula Alert, MD  Fluticasone-Umeclidin-Vilant (TRELEGY ELLIPTA) 100-62.5-25 MCG/INH AEPB Inhale 1 puff into the lungs daily. Patient not taking: Reported on 04/13/2021 09/29/20   Delsa Grana, PA-C  gabapentin (NEURONTIN) 300 MG capsule Take 600 mg by mouth 2 (two) times daily.     [provider]  HYDROcodone-acetaminophen (NORCO/VICODIN) 5-325 MG tablet 1 po qday prn. Patient not taking: Reported on 04/13/2021 03/19/20   [provider]  Lancet Devices ([place] LANCING DEVICE) [place]  09/09/20   [provider]  LATUDA 60 MG TABS TAKE 1 TABLET BY MOUTH DAILY WITH SUPPER Patient not taking: Reported on 04/13/2021 03/22/21   Ursula Alert, MD  meloxicam (MOBIC) 15 MG tablet Take by mouth. 09/16/19   [provider]  Multiple Vitamin (MULTIVITAMIN) tablet Take 1 tablet by mouth daily.    [provider]  omeprazole (PRILOSEC) 20 MG capsule Take 1 capsule (20 mg total) by mouth daily as needed. Take 30 mins to 1 hr before breakfast Patient not taking: Reported on 04/13/2021 02/03/21   Burnard Hawthorne, FNP  Semaglutide,0.25 or 0.5MG/DOS, (OZEMPIC, 0.25 OR 0.5 MG/DOSE,) 2 MG/1.5ML SOPN Inject 0.5 mg into the skin once a week. 03/24/21   Burnard Hawthorne, FNP  spironolactone (ALDACTONE) 25 MG tablet Take 0.5 tablets (12.5 mg total) by mouth daily. 04/13/21   Flinchum, Kelby Aline, FNP  telmisartan (MICARDIS) 80 MG tablet Take 1 tablet (80 mg total) by mouth daily. 04/13/21   Flinchum, Kelby Aline, FNP    Allergies Wellbutrin [bupropion] and Lasix [furosemide]  Family History  Problem Relation Age of Onset   Arthritis Mother  Asthma Mother    Mental  illness Mother    Thyroid disease Mother    COPD Mother    Heart disease Mother    Congestive Heart Failure Mother    Alcohol abuse Mother    Eating disorder Mother    Bipolar disorder Mother    Alcohol abuse Father    Heart attack Father    Alcohol abuse Sister    Drug abuse Sister    Mental illness Sister    Mental illness Sister    Fibromyalgia Sister    Obesity Sister    Pneumonia Sister    Depression Sister    Mental illness Sister    Alcohol abuse Sister    Drug abuse Sister    Arthritis Brother    Mental illness Brother    Cancer Brother        non-hodkins lymphoma   Drug abuse Daughter    Drug abuse Son    Alcohol abuse Son    Drug abuse Son    Alcohol abuse Son    Diabetes Neg Hx    Stroke Neg Hx    Breast cancer Neg Hx    Thyroid cancer Neg Hx     Social History Social History   Tobacco Use   Smoking status: Every Day    Packs/day: 0.75    Years: 41.00    Pack years: 30.75    Types: Cigarettes   Smokeless tobacco: Never  Vaping Use   Vaping Use: Former   Quit date: 07/15/2017  Substance Use Topics   Alcohol use: No    Alcohol/week: 0.0 standard drinks    Comment: sober since October 2018   Drug use: No    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     Review of Systems  Constitutional: No fever/chills Eyes:  No discharge ENT: No upper respiratory complaints. Respiratory: no cough. No SOB/ use of accessory muscles to breath Gastrointestinal:   No nausea, no vomiting.  No diarrhea.  No constipation. Musculoskeletal: Negative for musculoskeletal pain. Skin: Negative for rash, abrasions, lacerations, ecchymosis.    ____________________________________________   PHYSICAL EXAM:  VITAL SIGNS: ED Triage Vitals  Enc Vitals Group     BP 04/12/21 1649 (!) 190/106     Pulse Rate 04/12/21 1649 80     Resp 04/12/21 1649 18     Temp 04/12/21 1649 97.9 F (36.6 C)     Temp Source 04/12/21 1649 Oral     SpO2 04/12/21 1649 98 %      Weight 04/12/21 1650 209 lb (94.8 kg)     Height 04/12/21 1650 _0  (1.575 m)     Head Circumference --      Peak Flow --      Pain Score 04/12/21 1654 5     Pain Loc --      Pain Edu? --      Excl. in [place]? --      Constitutional: Alert and oriented. Well appearing and in no acute distress. Eyes: Conjunctivae are normal. PERRL. EOMI. Head: Atraumatic. ENT:      Nose: No congestion/rhinnorhea.      Mouth/Throat: Mucous membranes are moist.  Neck: No stridor.  No cervical spine tenderness to palpation. Cardiovascular: Normal rate, regular rhythm. Normal S1 and S2.  Good peripheral circulation. Respiratory: Normal respiratory effort without tachypnea or retractions. Lungs CTAB. Good air entry to the bases with no decreased or absent breath sounds Gastrointestinal: Bowel sounds x 4 quadrants. Soft  and nontender to palpation. No guarding or rigidity. No distention. Musculoskeletal: Full range of motion to all extremities. No obvious deformities noted Neurologic:  Normal for age. No gross focal neurologic deficits are appreciated.  Skin:  Skin is warm, dry and intact. No rash noted. Psychiatric: Mood and affect are normal for age. Speech and behavior are normal.   ____________________________________________   LABS (all labs ordered are listed, but only abnormal results are displayed)  Labs Reviewed  COMPREHENSIVE METABOLIC PANEL - Abnormal; Notable for the following components:      Result Value   Potassium 3.3 (*)    All other components within normal limits  CBC WITH DIFFERENTIAL/PLATELET - Abnormal; Notable for the following components:   WBC 10.6 (*)    All other components within normal limits   ____________________________________________  EKG   ____________________________________________  RADIOLOGY   No results found.  ____________________________________________    PROCEDURES  Procedure(s) performed:     Procedures     Medications  cloNIDine  (CATAPRES) tablet 0.1 mg (0.1 mg Oral Given 04/12/21 1934)  losartan (COZAAR) tablet 100 mg (100 mg Oral Given 04/12/21 2111)  carvedilol (COREG) tablet 6.25 mg (6.25 mg Oral Given 04/12/21 2122)     ____________________________________________   INITIAL IMPRESSION / ASSESSMENT AND PLAN / ED COURSE  Pertinent labs & imaging results that were available during my care of the patient were reviewed by me and considered in my medical decision making (see chart for details).      Assessment and plan Hypertension 60 year old female presents to the emergency department with elevated blood pressure.  Her blood pressure was elevated at 190/106 at triage.  She was initially given clonidine and her blood pressure came down minimally.  CT head showed no acute intracranial abnormality.  She was given her losartan and carvedilol and her blood pressure improved significantly to 167/71.  I advised patient to take blood pressure medications as directed and to make a follow-up with her primary care provider to discuss elevated blood pressure.     ____________________________________________  FINAL CLINICAL IMPRESSION(S) / ED DIAGNOSES  Final diagnoses:  Hypertension, unspecified type      NEW MEDICATIONS STARTED DURING THIS VISIT:  ED Discharge Orders     None           This chart was dictated using voice recognition software/Dragon. Despite best efforts to proofread, errors can occur which can change the meaning. Any change was purely unintentional.     Lannie Fields, PA-C 04/19/21 1646    Arta Silence, MD 04/19/21 [phone]

## 2021-04-19 NOTE — Progress Notes (Signed)
Industry Counselor/Therapist Progress Note  Patient ID: Susan [name], MRN: [id number]   Date: 04/19/21  Time Spent: 2:33  pm - 3:06 pm : 33 Minutes  Treatment Type: Individual Therapy.  Reported Symptoms: Anxiety and interpersonal stressors.    Mental Status Exam: Appearance:  NA     Behavior: Appropriate  Motor: Normal  Speech/Language:  Negative  Affect: Congruent  Mood: anxious  Thought process: normal  Thought content:   WNL  Sensory/Perceptual disturbances:   WNL  Orientation: oriented to person, place, time/date, and situation  Attention: Good  Concentration: Good  Memory: WNL  Fund of knowledge:  Good  Insight:   Good  Judgment:  Good  Impulse Control: Good   Risk Assessment: Danger to Self:  No Self-injurious Behavior: This Danger to Others: No Duty to Warn:no Physical Aggression / Violence:No  Access to Firearms a concern: No  Gang Involvement:No   Subjective:   Blanchard Mane Rosner participated from home, via phone and consented to treatment. Therapist participated from home office. We met online due to [place] pandemic. Genae reviewed the events of the past week.  Lilymarie discussed an increase in her overall anxiety and noted this partially being as a result of her currently unmanaged hypertension.  She will be noted speaking with her primary care physician to address this concern and she is currently monitoring her blood pressure on 4 times a day schedule.  As a result, she will be noted a higher level of anxiety.  Therapist provided psychoeducation regarding hypertension and the possible effects on overall anxiety.  We worked on reviewing possible tools and skills to manage this in the meantime including employing relaxation skills including breathing exercises.  We worked on formulating a treatment plan to be effective for this next 12 months.  She will be highlighted her goals for treatment which include consistent engagement in hopeful  activities, engaging in enjoyable activities, completing tasks at home on a consistent basis, managing her overall symptoms, and addressing interpersonal stressors. We reviewed numerous treatment approaches including CBT, BA, Problem Solving, and Solution focused therapy. Psych-education regarding the Armando L Markwardt diagnosis of Mild depressed bipolar 1 disorder ([place]) was provided during the session . Earma L Dunkley provided verbal approval of the treatment plan.  She will be continues to take her medication consistently and has a follow-up scheduled with her psychiatrist Dr. Shea Evans for medication check.  She will be noted working on managing her interpersonal stressors, specifically with her sister, during her recent family event and noted doing well overall in that regard.  Therapist praised Wheeler for her effort and energy between and during sessions and provided supportive therapy.  Interventions:  Goal Setting Session  Diagnosis: Mild depressed bipolar 1 disorder ([place])   Plan: Patient is to use CBT, BA, Problem Solving, Solution Focused, mindfulness,  and coping skills to help manage decrease symptoms associated with their diagnosis.   Related Problem: Alleviate depressive symptoms and return to previous level of effective functioning. Description: Learn and implement problem-solving and decision-making skills. Target Date: 2022-04-19 Progress: 0%  Related Problem: Appropriately grieve the loss in order to normalize mood and to return to previously adaptive level of functioning. Description: Use mindfulness and acceptance strategies to reduce experiential and cognitive avoidance and increase value-based behavior. Target Date: 2022-04-19 Progress: 0%  Related Problem: Appropriately grieve the loss in order to normalize mood and to return to previously adaptive level of functioning. Description: Increasingly verbalize hopeful and positive statements regarding self, others, and  the  future. Target Date: 2022-04-19 Progress: 20%  Related Problem: Appropriately grieve the loss in order to normalize mood and to return to previously adaptive level of functioning. Description: Describe current and past experiences with depression including their impact on functioning and attempts to resolve it. Target Date: 2022-04-19 Progress: 10%  Related Problem: Appropriately grieve the loss in order to normalize mood and to return to previously adaptive level of functioning. Description: Learn and implement conflict resolution skills to resolve interpersonal problems. Target Date: 2022-04-19 Progress: 30%  Related Problem: Appropriately grieve the loss in order to normalize mood and to return to previously adaptive level of functioning. Description: Verbalize an understanding and resolution of current interpersonal problems. Target Date: 2022-04-19  Progress: 20%  Related Problem: Appropriately grieve the loss in order to normalize mood and to return to previously adaptive level of functioning. Description: Implement mindfulness techniques for relapse prevention. Target Date: 2022-04-19 Progress: 10%  Related Problem: Learn and implement coping skills that result in a reduction of anxiety and worry, and improved daily functioning. Description: Reestablish a consistent sleep-wake cycle. Target Date: 2022-04-19 Progress: 10%  Related Problem: Learn and implement coping skills that result in a reduction of anxiety and worry, and improved daily functioning. Description: Learn to accept limitations in life and commit to tolerating, rather than avoiding, unpleasant emotions while accomplishing meaningful goals. Target Date: 2022-04-19 Progress: 10%  Related Problem: Learn and implement coping skills that result in a reduction of anxiety and worry, and improved daily functioning. Description: Learn and implement problem-solving strategies for realistically addressing  worries. Target Date: 2022-04-19 Progress: 10%  Related Problem: Learn and implement coping skills that result in a reduction of anxiety and worry, and improved daily functioning. Description: Identify, challenge, and replace biased, fearful self-talk with positive, realistic, and empowering self-talk. Target Date: 2022-04-19 Progress: 20%  Related Problem: Learn and implement coping skills that result in a reduction of anxiety and worry, and improved daily functioning. Description: Learn and implement a strategy to limit the association between various environmental settings and worry, delaying the worry until a designated "worry time." Target Date: 2022-04-19 Progress: 0%  Related Problem: Learn and implement coping skills that result in a reduction of anxiety and worry, and improved daily functioning. Description: Identify and engage in pleasant activities on a daily basis. Target Date: 2022-04-19 Progress: 30%  Related Problem: Learn and implement coping skills that result in a reduction of anxiety and worry, and improved daily functioning. Description: Identify the major life conflicts from the past and present that form the basis for present anxiety. Target Date: 2022-04-19 Progress: 20%  Related Problem: Learn and implement coping skills that result in a reduction of anxiety and worry, and improved daily functioning. Description: Cooperate with a medication evaluation by a physician. Target Date: 2022-04-19 Progress: 100%  Buena Irish, LCSW

## 2021-04-20 ENCOUNTER — Ambulatory Visit: Payer: Medicare Other | Admitting: Pharmacist

## 2021-04-20 ENCOUNTER — Other Ambulatory Visit (INDEPENDENT_AMBULATORY_CARE_PROVIDER_SITE_OTHER): Payer: Medicare Other

## 2021-04-20 ENCOUNTER — Other Ambulatory Visit: Payer: Self-pay

## 2021-04-20 DIAGNOSIS — I10 Essential (primary) hypertension: Secondary | ICD-10-CM

## 2021-04-20 DIAGNOSIS — F319 Bipolar disorder, unspecified: Secondary | ICD-10-CM

## 2021-04-20 DIAGNOSIS — I700 Atherosclerosis of aorta: Secondary | ICD-10-CM

## 2021-04-20 LAB — BASIC METABOLIC PANEL
BUN: 10 mg/dL (ref 6–23)
CO2: 28 mEq/L (ref 19–32)
Calcium: 9.3 mg/dL (ref 8.4–10.5)
Chloride: 101 mEq/L (ref 96–112)
Creatinine, Ser: 0.92 mg/dL (ref 0.40–1.20)
GFR: 67.81 mL/min (ref >60.00)
Glucose, Bld: 65 mg/dL — ABNORMAL LOW (ref 70–99)
Potassium: 3.5 mEq/L (ref 3.5–5.1)
Sodium: 135 mEq/L (ref 135–145)

## 2021-04-20 MED ORDER — SPIRONOLACTONE 25 MG PO TABS: 25.0000 mg | ORAL_TABLET | Freq: Every day | ORAL | 1 refills | Status: DC

## 2021-04-20 NOTE — Progress Notes (Signed)
Noted, that should be clarified, Spironolactone increase is to 25 mg po once daily( not 50mg )  and patient is aware.  Glucose was low end normal monitor  glucose.

## 2021-04-20 NOTE — Patient Instructions (Signed)
Visit Information  Following are the goals we discussed today:  Patient Goals/Self-Care Activities patient will:  - take medications as prescribed as evidenced by patient report and record review check blood pressure once daily, document, and provide at future appointments          Plan: Telephone follow up appointment with care management team member scheduled for:  pending results next week   [name] Darnelle Maffucci, PharmD, Para [place], CPP Clinical Pharmacist [place] [facility] [phone]     Please call the care guide team at [phone] if you need to cancel or reschedule your appointment.   Patient verbalizes understanding of instructions provided today and agrees to view in [place].

## 2021-04-20 NOTE — Progress Notes (Signed)
Noted agree with Susan [name] D on spironolactone increase to 50 mg once daily. Keep follow up in office next week and she should have received a call from cardiology to schedule an appointment for evaluation as well?

## 2021-04-20 NOTE — Chronic Care Management (AMB) (Signed)
Chronic Care Management CCM Pharmacy Note  04/20/2021 Name:  Susan [name] MRN:  [id number] DOB:  [birth year]  Summary: - BMP WNL. BP still elevated  Recommendations/Changes made from today's visit: - Increase spironolactone to 25 mg daily.   Subjective: Susan [name] is an 60 y.o. year old female who is a primary patient of Flinchum, Kelby Aline, FNP.  The CCM team was consulted for assistance with disease management and care coordination needs.    Engaged with patient by telephone for follow up visit for pharmacy case management and/or care coordination services.   Objective:  Medications Reviewed Today     Reviewed by De Hollingshead, RPH-CPP (Pharmacist) on 04/13/21 at 1453  Med List Status: <None>   Medication Order Taking? Sig Documenting Provider Last Dose Status Informant  ACCU-CHEK GUIDE test strip [id number]  TEST UP TO 4 TIMES A DAY Brita Romp Dionne Bucy, MD  Active   Accu-Chek Softclix Lancets lancets [id number]  TEST 4 TIMES A DAY Flinchum, Kelby Aline, FNP  Active   albuterol (VENTOLIN HFA) 108 (90 Base) MCG/ACT inhaler [id number] No Inhale 2 puffs into the lungs every 4 (four) hours as needed for wheezing or shortness of breath.  Patient not taking: Reported on 04/13/2021   Delsa Grana, PA-C Not Taking Active   atorvastatin (LIPITOR) 10 MG tablet [id number] Yes TAKE 1 TABLET (10 MG TOTAL) BY MOUTH AT BEDTIME. Flinchum, Kelby Aline, FNP Taking Active   B-D UF III MINI PEN NEEDLES 31G X 5 MM MISC [id number]  USE WITH OZEMPIC Flinchum, Kelby Aline, FNP  Active   blood glucose meter kit and supplies [id number]  Dispense based on patient and insurance preference. Use up to four times daily as directed. (FOR ICD-10 E10.9, E11.9). Flinchum, Kelby Aline, FNP  Active   carvedilol (COREG) 6.25 MG tablet [id number] Yes Take 1 tablet (6.25 mg total) by mouth 2 (two) times daily with a meal. Crecencio Mc, MD Taking Active   clindamycin (CLEOCIN T) 1 % external solution [id number]  No Apply to aa's scalp QD PRN  Patient not taking: Reported on 04/13/2021   Ralene Bathe, MD Not Taking Active   clotrimazole (LOTRIMIN) 1 % cream [id number] No Apply 1 application topically 2 (two) times daily.  Patient not taking: Reported on 04/13/2021   Delsa Grana, PA-C Not Taking Active            Med Note [facility], Wilder Glade Jan 27, 2020  9:11 AM) Prn   cyclobenzaprine (FLEXERIL) 5 MG tablet [id number] Yes Take 5 mg by mouth 2 (two) times daily as needed for muscle spasms.  [provider] Taking Active Self           Med Note De Hollingshead   Tue Apr 13, 2021  2:51 PM) 10 mg QPM  DULoxetine (CYMBALTA) 60 MG capsule [id number] Yes TAKE 1 CAPSULE (60 MG TOTAL) BY MOUTH 2 (TWO) TIMES DAILY. Ursula Alert, MD Taking Active   Fluticasone-Umeclidin-Vilant (TRELEGY ELLIPTA) 100-62.5-25 MCG/INH AEPB [id number] No Inhale 1 puff into the lungs daily.  Patient not taking: Reported on 04/13/2021   Delsa Grana, PA-C Not Taking Active Self  gabapentin (NEURONTIN) 300 MG capsule [id number] Yes Take 600 mg by mouth 2 (two) times daily.  [provider] Taking Active Self           Med Note Sharene Butters   Fri Apr 22, 2016 12:30 PM)    HYDROcodone-acetaminophen (NORCO/VICODIN) 5-325 MG tablet [id number] No  1 po qday prn.  Patient not taking: Reported on 04/13/2021   [provider] Not Taking Active   Lancet Devices (TRUEDRAW LANCING DEVICE) [place] [id number]   [provider]  Active   LATUDA 60 MG TABS [id number] No TAKE 1 TABLET BY MOUTH DAILY WITH SUPPER  Patient not taking: Reported on 04/13/2021   Ursula Alert, MD Not Taking Active   losartan (COZAAR) 100 MG tablet [id number] Yes TAKE 1 TABLET (100 MG TOTAL) BY MOUTH DAILY. Flinchum, Kelby Aline, FNP Taking Active   meloxicam (MOBIC) 15 MG tablet [id number] Yes Take by mouth. [provider] Taking Active   omeprazole (PRILOSEC) 20 MG capsule [id number] No Take 1 capsule (20 mg total) by  mouth daily as needed. Take 30 mins to 1 hr before breakfast  Patient not taking: Reported on 04/13/2021   Burnard Hawthorne, FNP Not Taking Active   Semaglutide,0.25 or 0.5MG/DOS, (OZEMPIC, 0.25 OR 0.5 MG/DOSE,) 2 MG/1.5ML SOPN [id number] Yes Inject 0.5 mg into the skin once a week. Burnard Hawthorne, FNP Taking Active   vitamin B-12 (CYANOCOBALAMIN) 1000 MCG tablet [id number] Yes Take 1,000 mcg by mouth daily.  [provider] Taking Active Self            Pertinent Labs:   Lab Results  Component Value Date   HGBA1C 5.5 02/03/2021   Lab Results  Component Value Date   CHOL 151 02/10/2021   HDL 55.20 02/10/2021   LDLCALC 71 02/10/2021   TRIG 124.0 02/10/2021   CHOLHDL 3 02/10/2021   Lab Results  Component Value Date   CREATININE 0.92 04/20/2021   BUN 10 04/20/2021   NA 135 04/20/2021   K 3.5 04/20/2021   CL 101 04/20/2021   CO2 28 04/20/2021    SDOH:  (Social Determinants of Health) assessments and interventions performed:  SDOH Interventions    Flowsheet Row Most Recent Value  SDOH Interventions   Financial Strain Interventions Intervention Not Indicated       CCM Care Plan  Review of patient past medical history, allergies, medications, health status, including review of consultants reports, laboratory and other test data, was performed as part of comprehensive evaluation and provision of chronic care management services.   Care Plan : Medication Management  Updates made by De Hollingshead, RPH-CPP since 04/20/2021 12:00 AM     Problem: Hypertension, Tobacco Use, COPD      Long-Range Goal: Disease Progression Prevention   Start Date: 04/13/2021  Recent Progress: On track  Priority: High  Note:   Current Barriers:  Unable to achieve control of blood pressure   Pharmacist Clinical Goal(s):  patient will achieve control of blood pressure asthrough collaboration with PharmD and provider.    Interventions: 1:1 collaboration with Flinchum,  Kelby Aline, FNP regarding development and update of comprehensive plan of care as evidenced by provider attestation and co-signature Inter-disciplinary care team collaboration (see longitudinal plan of care) Comprehensive medication review performed; medication list updated in electronic medical record  Hypertension:   Uncontrolled; current treatment: valsartan 80 mg, carvedilol 12.5 mg BID; spironolactone 12.5 mg daily  Current home readings:  BP 160-200s/80-100s, HR 70-80; reports that she is checking 4 times daily.  Current smoker Denies chest pain, visual changes, headaches today. Reviewed BMP. Recommend increasing spironolactone to 25 mg daily. Continue carvedilol 12.5 mg BID, valsartan 80 mg daily. Advised patient to only check blood pressure once daily, as opposed to 4 times daily. Follow up with PCP next week for repeat Scr/K  and blood pressure.    Hyperlipidemia:   Controlled; current treatment: atorvastatin 10 mg daily  Previously recommended to continue current regimen at this time  Chronic Obstructive Pulmonary Disease:   Uncontrolled;current treatment:Trelegy 100/62.5/25; albuterol HFA - though reports she stopped the Trelegy, as she feels like it makes her breathing worse. Has not required albuterol recently.  Previously recommended to continue current regimen at this time. Will discuss therapy optimization moving forward  Bipolar Depression, pain:   Worsened anxiety due to blood pressure; current treatment: duloxetine 60 mg BID, Latuda 60 mg daily, gabepentin 600 mg BID, meloxicam 15 mg daily, hydrocodone/acetaminophen 5/325 mg PRN Previously recommended to continue current regimen    Patient Goals/Self-Care Activities patient will:  - take medications as prescribed as evidenced by patient report and record review check blood pressure once daily, document, and provide at future appointments       Plan: Telephone follow up appointment with care management team member  scheduled for:  pending results next week  [name] Darnelle Maffucci, PharmD, Para [place], [place] Clinical Pharmacist [employer] at [facility] [phone]

## 2021-04-22 ENCOUNTER — Encounter: Payer: Self-pay | Admitting: Psychiatry

## 2021-04-22 ENCOUNTER — Telehealth (INDEPENDENT_AMBULATORY_CARE_PROVIDER_SITE_OTHER): Payer: Medicare Other | Admitting: Psychiatry

## 2021-04-22 ENCOUNTER — Other Ambulatory Visit: Payer: Self-pay

## 2021-04-22 DIAGNOSIS — F1021 Alcohol dependence, in remission: Secondary | ICD-10-CM

## 2021-04-22 DIAGNOSIS — F3176 Bipolar disorder, in full remission, most recent episode depressed: Secondary | ICD-10-CM | POA: Diagnosis not present

## 2021-04-22 DIAGNOSIS — Z634 Disappearance and death of family member: Secondary | ICD-10-CM | POA: Diagnosis not present

## 2021-04-22 DIAGNOSIS — F1421 Cocaine dependence, in remission: Secondary | ICD-10-CM

## 2021-04-22 DIAGNOSIS — F17200 Nicotine dependence, unspecified, uncomplicated: Secondary | ICD-10-CM

## 2021-04-22 DIAGNOSIS — F4310 Post-traumatic stress disorder, unspecified: Secondary | ICD-10-CM | POA: Diagnosis not present

## 2021-04-22 NOTE — Progress Notes (Signed)
Virtual Visit via Telephone Note  I connected with Parole on 04/22/21 at  4:40 PM EST by telephone and verified that I am speaking with the correct person using two identifiers.  Location Provider Location : [facility] Patient Location : Home  Participants: Patient , Provider   I discussed the limitations, risks, security and privacy concerns of performing an evaluation and management service by telephone and the availability of in person appointments. I also discussed with the patient that there may be a patient responsible charge related to this service. The patient expressed understanding and agreed to proceed   I discussed the assessment and treatment plan with the patient. The patient was provided an opportunity to ask questions and all were answered. The patient agreed with the plan and demonstrated an understanding of the instructions.   The patient was advised to call back or seek an in-person evaluation if the symptoms worsen or if the condition fails to improve as anticipated.                                                                 [place] MD OP Progress Note  04/22/2021 5:17 PM LOY LITTLE  MRN:  [id number]  Chief Complaint:  Chief Complaint   Follow-up; Depression    HPI: BERONICA LANSDALE is a 60 year old Caucasian female, widowed, disabled, lives in [place], has a history of bipolar disorder, alcohol and cocaine use disorder in remission, tobacco use disorder, PTSD, bereavement, degenerative disc disease, obstructive sleep apnea on CPAP, history of total hip replacement, osteoarthritis, hepatitis C was evaluated by telemedicine today.  Patient today reports she is currently struggling with elevated blood pressure readings and is currently on multiple antihypertensive medications.  She is currently following up with her primary care provider for the same.  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  She reports she works 3 days a week and enjoys it.  Patient denies any significant mood swings , she however is anxious about her health problems.  Patient denies any suicidality, homicidality or perceptual disturbances.  Patient is trying to cut back on smoking cigarettes.  Patient continues to stay sober from alcohol.  Patient is compliant on medications that denies any concerns today.  Visit Diagnosis:    ICD-10-CM   1. Bipolar disorder, in full remission, most recent episode depressed ([place])  F31.76     2. PTSD (post-traumatic stress disorder)  F43.10     3. Tobacco use disorder  F17.200     4. Bereavement  Z63.4     5. Alcohol use disorder, severe, in sustained remission ([place])  F10.21     6. Cocaine use disorder, moderate, in sustained remission ([place])  F14.21       Past Psychiatric History: Reviewed past psychiatric history from progress note on 09/27/2019.  Past trials of Seroquel, Cymbalta, Abilify, risperidone  Past Medical History:  Past Medical History:  Diagnosis Date   ADD (attention deficit disorder)    Alcohol abuse    Anemia    Anxiety    Aortic atherosclerosis ([place]) 02/20/2018   Chest CT Sept 2019   Arthritis    rheumatoid arthritis   Asthma    Bipolar disorder ([place])  Centrilobular emphysema ([place]) 02/20/2018   Chest CT Sept 2019   Constipation    Degenerative disc disease at L5-S1 level 09/28/2016   See ortho note May 2018   Depression    bipolar, hx of suicide attempt   Drug use    Dyspnea    with exertion   GERD (gastroesophageal reflux disease)    H/O suicide attempt    slit wrists   Hepatitis C 06/26/2014   Hip pain    History of alcohol abuse    History of diverticulitis 2013   History of hepatitis C    HEP "C"--three years ago   History of MRSA infection 2013   HLD (hyperlipidemia)    Hypertension    Hypothyroidism    Incisional hernia 11/08/2012   Knee pain    MRSA carrier Nov 2013   Multinodular thyroid    OSA  (obstructive sleep apnea)    managed by Dr. Manuella Ghazi   Osteoporosis    Post-traumatic osteoarthritis of right knee 09/24/2015   Prediabetes    Recurrent ventral hernia 11/08/2012   Sleep apnea     Dx 3 years ago. Use C-PAP   Status post total right knee replacement using cement 05/10/2016   Thyroid disease    Goiter   Vitamin B12 deficiency    Vitamin D deficiency disease     Past Surgical History:  Procedure Laterality Date   BILATERAL SALPINGOOPHORECTOMY     due to abnormal mass   BREAST BIOPSY Left    neg   BREAST SURGERY Left 20 yrs ago   CESAREAN SECTION     COLONOSCOPY     COLONOSCOPY WITH PROPOFOL N/A 10/16/2017   Procedure: COLONOSCOPY WITH PROPOFOL;  Surgeon: Jonathon Bellows, MD;  Location[facility]COPY;  Service: Gastroenterology;  Laterality: N/A;   COLONOSCOPY WITH PROPOFOL N/A 02/16/2021   Procedure: COLONOSCOPY WITH PROPOFOL;  Surgeon: Jonathon Bellows, MD;  Location: [facility] ENDOSCOPY;  Service: Gastroenterology;  Laterality: N/A;   HERNIA REPAIR  06/2011, July 2014   Ventral wall repair with Physiomesh   HERNIA REPAIR     2nd.vental wall repair   JOINT REPLACEMENT Right    knee   TONSILLECTOMY     TOTAL HIP ARTHROPLASTY Left 07/23/2019   Procedure: TOTAL HIP ARTHROPLASTY;  Surgeon: Corky Mull, MD;  Location: [facility] ORS;  Service: Orthopedics;  Laterality: Left;   TOTAL KNEE ARTHROPLASTY Right 05/10/2016   Procedure: TOTAL KNEE ARTHROPLASTY;  Surgeon: Corky Mull, MD;  Location: [facility] ORS;  Service: Orthopedics;  Laterality: Right;   TUBAL LIGATION      Family Psychiatric History: Reviewed family psychiatric history from progress note on 09/27/2019  Family History:  Family History  Problem Relation Age of Onset   Arthritis Mother    Asthma Mother    Mental illness Mother    Thyroid disease Mother    COPD Mother    Heart disease Mother    Congestive Heart Failure Mother    Alcohol abuse Mother    Eating disorder Mother    Bipolar disorder Mother    Alcohol abuse Father     Heart attack Father    Alcohol abuse Sister    Drug abuse Sister    Mental illness Sister    Mental illness Sister    Fibromyalgia Sister    Obesity Sister    Pneumonia Sister    Depression Sister    Mental illness Sister    Alcohol abuse Sister    Drug abuse Sister  Arthritis Brother    Mental illness Brother    Cancer Brother        non-hodkins lymphoma   Drug abuse Daughter    Drug abuse Son    Alcohol abuse Son    Drug abuse Son    Alcohol abuse Son    Diabetes Neg Hx    Stroke Neg Hx    Breast cancer Neg Hx    Thyroid cancer Neg Hx     Social History: Reviewed social history from progress note on 09/27/2019 Social History   Socioeconomic History   Marital status: Widowed    Spouse name: Not on file   Number of children: 3   Years of education: Not on file   Highest education level: Some college, no degree  Occupational History   Occupation: disabled  Tobacco Use   Smoking status: Every Day    Packs/day: 0.75    Years: 41.00    Pack years: 30.75    Types: Cigarettes   Smokeless tobacco: Never  Vaping Use   Vaping Use: Former   Quit date: 07/15/2017  Substance and Sexual Activity   Alcohol use: No    Alcohol/week: 0.0 standard drinks    Comment: sober since October 2018   Drug use: No    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   Sexual activity: Not Currently  Other Topics Concern   Not on file  Social History Narrative   Not on file   Social Determinants of Health   Financial Resource Strain: Low Risk    Difficulty of Paying Living Expenses: Not very hard  Food Insecurity: No Food Insecurity   Worried About Charity fundraiser in the Last Year: Never true   [place] in the Last Year: Never true  Transportation Needs: No Transportation Needs   Lack of Transportation (Medical): No   Lack of Transportation (Non-Medical): No  Physical Activity: Inactive   Days of Exercise per Week: 0 days   Minutes of Exercise per Session: 0  min  Stress: No Stress Concern Present   Feeling of Stress : Only a little  Social Connections: Moderately Isolated   Frequency of Communication with Friends and Family: More than three times a week   Frequency of Social Gatherings with Friends and Family: More than three times a week   Attends Religious Services: Never   Marine scientist or Organizations: Yes   Attends Music therapist: More than 4 times per year   Marital Status: Widowed    Allergies:  Allergies  Allergen Reactions   Wellbutrin [Bupropion]     Patient reports made " deathly sick " years ago at appointment 03/18/20.   Lasix [Furosemide] Other (See Comments)    Electrolyte imbalance    Metabolic Disorder Labs: Lab Results  Component Value Date   HGBA1C 5.5 02/03/2021   MPG 108 04/08/2019   MPG 103 08/16/2017   No results found for: PROLACTIN Lab Results  Component Value Date   CHOL 151 02/10/2021   TRIG 124.0 02/10/2021   HDL 55.20 02/10/2021   CHOLHDL 3 02/10/2021   VLDL 24.8 02/10/2021   LDLCALC 71 02/10/2021   LDLCALC 79 08/21/2019   Lab Results  Component Value Date   TSH 1.09 10/16/2019   TSH 1.430 07/26/2018    Therapeutic Level Labs: No results found for: LITHIUM No results found for: VALPROATE No components found for:  CBMZ  Current Medications: Current Outpatient Medications  Medication  Sig Dispense Refill   gabapentin (NEURONTIN) 300 MG capsule Take 2 capsules by mouth 2 (two) times daily.     LATUDA 60 MG TABS TAKE 1 TABLET BY MOUTH DAILY WITH SUPPER 30 tablet 1   ACCU-CHEK GUIDE test strip TEST UP TO FOUR TIMES A DAY 100 each 0   Accu-Chek Softclix Lancets lancets TEST 4 TIMES A DAY 100 each 1   albuterol (VENTOLIN HFA) 108 (90 Base) MCG/ACT inhaler Inhale 2 puffs into the lungs every 4 (four) hours as needed for wheezing or shortness of breath. (Patient not taking: Reported on 04/13/2021) 18 g 2   atorvastatin (LIPITOR) 10 MG tablet TAKE 1 TABLET (10 MG TOTAL)  BY MOUTH AT BEDTIME. 90 tablet 3   B-D UF III MINI PEN NEEDLES 31G X 5 MM MISC USE WITH OZEMPIC 100 each 1   blood glucose meter kit and supplies Dispense based on patient and insurance preference. Use up to four times daily as directed. (FOR ICD-10 E10.9, E11.9). 1 each 0   carvedilol (COREG) 12.5 MG tablet Take 1 tablet (12.5 mg total) by mouth 2 (two) times daily with a meal. 60 tablet 2   clindamycin (CLEOCIN T) 1 % external solution Apply to aa's scalp QD PRN (Patient not taking: Reported on 04/13/2021) 60 mL 3   clotrimazole (LOTRIMIN) 1 % cream Apply 1 application topically 2 (two) times daily. (Patient not taking: Reported on 04/13/2021) 30 g 0   cyclobenzaprine (FLEXERIL) 5 MG tablet Take 5 mg by mouth 2 (two) times daily as needed for muscle spasms.      DULoxetine (CYMBALTA) 60 MG capsule TAKE 1 CAPSULE TWICE A DAY 180 capsule 0   Fluticasone-Umeclidin-Vilant (TRELEGY ELLIPTA) 100-62.5-25 MCG/INH AEPB Inhale 1 puff into the lungs daily. (Patient not taking: Reported on 04/13/2021) 28 each 11   gabapentin (NEURONTIN) 300 MG capsule Take 600 mg by mouth 2 (two) times daily.      HYDROcodone-acetaminophen (NORCO/VICODIN) 5-325 MG tablet 1 po qday prn. (Patient not taking: Reported on 04/13/2021)     Lancet Devices (TRUEDRAW LANCING DEVICE) MISC      meloxicam (MOBIC) 15 MG tablet Take by mouth.     Multiple Vitamin (MULTIVITAMIN) tablet Take 1 tablet by mouth daily.     omeprazole (PRILOSEC) 20 MG capsule Take 1 capsule (20 mg total) by mouth daily as needed. Take 30 mins to 1 hr before breakfast (Patient not taking: Reported on 04/13/2021) 90 capsule 1   Semaglutide,0.25 or 0.5MG/DOS, (OZEMPIC, 0.25 OR 0.5 MG/DOSE,) 2 MG/1.5ML SOPN Inject 0.5 mg into the skin once a week. 3 mL 2   spironolactone (ALDACTONE) 25 MG tablet Take 1 tablet (25 mg total) by mouth daily. 90 tablet 1   telmisartan (MICARDIS) 80 MG tablet Take 1 tablet (80 mg total) by mouth daily. 30 tablet 2   No current  facility-administered medications for this visit.     Musculoskeletal: Strength & Muscle Tone:  UTA Gait & Station:  UTA Patient leans: N/A  Psychiatric Specialty Exam: Review of Systems  Psychiatric/Behavioral:  The patient is nervous/anxious.   All other systems reviewed and are negative.  There were no vitals taken for this visit.There is no height or weight on file to calculate BMI.  General Appearance:  UTA  Eye Contact:   UTA  Speech:  Clear and Coherent  Volume:  Normal  Mood:  Anxious  Affect:   UTA  Thought Process:  Goal Directed and Descriptions of Associations: Intact  Orientation:  Full (Time, Place, and Person)  Thought Content: Logical   Suicidal Thoughts:  No  Homicidal Thoughts:  No  Memory:  Immediate;   Fair Recent;   Fair Remote;   Fair  Judgement:  Fair  Insight:  Fair  Psychomotor Activity:  Normal  Concentration:  Concentration: Fair and Attention Span: Fair  Recall:  [employer] of Knowledge: Fair  Language: Fair  Akathisia:  No  Handed:  Right  AIMS (if indicated):  not done  Assets:  Communication Skills Desire for Improvement Social Support  ADL's:  Intact  Cognition: WNL  Sleep:  Fair   Screenings: GAD-7    Flowsheet Row Video Visit from 04/22/2021 in [place] Office Visit from 11/06/2019 in [facility] Office Visit from 09/06/2018 in [place]  Total GAD-7 Score '5 21 14      ' PHQ2-9    Flowsheet Row Video Visit from 04/22/2021 in [place] Video Visit from 12/03/2020 in [place] Video Visit from 08/11/2020 in [place] Video Visit from 07/03/2020 in [place] Office Visit from 05/28/2020 in [place]  PHQ-2 Total Score '2 1 3 1 1  ' PHQ-9 Total Score '4 7 10 ' -- 6      Flowsheet Row ED from 04/12/2021 in [place] Admission (Discharged) from 02/16/2021 in [place] [facility] Video Visit from 08/11/2020 in [place] No Risk No Risk Low Risk        Assessment and Plan: DAPHNE KARRER is a 60 year old Caucasian female who has a history of bipolar disorder, degenerative disc disease, multiple medical problems was evaluated by telemedicine today.  Patient is currently stable with regards to her mood.  Discussed plan as noted below.  Plan Bipolar disorder type I depressed in remission Latuda 60 mg p.o. daily Cymbalta 60 mg p.o. daily.  PTSD-stable Cymbalta 60 mg p.o. daily Patient to continue psychotherapy sessions with her therapist-Mr.Mostafa  Bereavement-improving Continue psychotherapy sessions Hydroxyzine 25 mg p.o. 3 times daily as needed  Tobacco use disorder-improving Provided counseling for 1 minute.  Nicotine patch is unavailable.  Alcohol use disorder in remission Continue AA meetings  Patient with uncontrolled hypertension, discussed with patient the effect of Cymbalta on her blood pressure, patient however wants to stay on the Cymbalta and is not interested in being tapered off of it.  Follow-up in clinic in 1 month or sooner if needed in person.   I have spent at least 18 minutes non face to face with patient today.  This note was generated in part or whole with voice recognition software. Voice recognition is usually quite accurate but there are transcription errors that can and very often do occur. I apologize for any typographical errors that were not detected and corrected.      Ursula Alert, MD 04/23/2021, 1:42 PM

## 2021-04-23 ENCOUNTER — Telehealth: Payer: Self-pay

## 2021-04-23 NOTE — Telephone Encounter (Signed)
Placed call to pt to clarify medication dose. LMTCB

## 2021-04-27 NOTE — Progress Notes (Addendum)
Acute Office Visit  Subjective:    Patient ID: Susan [name], female    DOB: 08-25-1960, 60 y.o.   MRN: [id number]  Chief Complaint  Patient presents with   Follow-up    Hypertension This is a recurrent problem. Associated symptoms include chest pain (None today but has been reported over the last 2 months, she did have COVID in November.) and shortness of breath. Not taking NSAIDS.  Patient is in today for follow up on hypertension and she has had elevated blood pressure since covid in november. Cough has improved starting to subside. She does endorse some shortness of breath still with exertion post covid.  She denies any wheezing.  Denies any chest pain today.  She does report that she has been having some chest pressure occasionally over the last 1 to 3 months, she does not equate to be a stabbing pain but just more of a pressure/ dullness inside chest. It does not radiate anywhere.  Lasts maybe 1- 2 minutes occurs intermittently. She does notice dyspnea with exertion.was previously walking but not doing this anymore due to dyspnea since covid she reports.  Patient does have a tobacco history smoking. Heart rate at home has been over 70. She denies any other symptoms she denies a productive cough. Denies any drug or alcohol use.  Denies any edema.  She does not have any active chest pain today. Patient  denies any fever, body aches,chills, rash, chest pain,  nausea, vomiting, or diarrhea.    Denies dizziness, lightheadedness, pre syncopal or syncopal episodes.   Pharm D note reviewed below. Patient reports she is taking blood pressure as below.  Hypertension:   Uncontrolled; current treatment: valsartan 80 mg, carvedilol 12.5 mg BID; spironolactone 12.5 mg daily  Current home readings:  BP 160-200s/80-100s, HR 70-80; reports that she is checking 4 times daily.  Current smoker Denies chest pain, visual changes, headaches today. Reviewed BMP. Recommend increasing  spironolactone to 25 mg daily. Continue carvedilol 12.5 mg BID, valsartan 80 mg daily. Advised patient to only check blood pressure once daily, as opposed to 4 times daily. Follow up with PCP next week for repeat Scr/K and blood pressure.    Psychiatrist feels Cymbalta could be increasing she has been on for 15 years.   Past Medical History:  Diagnosis Date   ADD (attention deficit disorder)    Alcohol abuse    Anemia    Anxiety    Aortic atherosclerosis ([place]) 02/20/2018   Chest CT Sept 2019   Arthritis    rheumatoid arthritis   Asthma    Bipolar disorder ([place])    Centrilobular emphysema ([place]) 02/20/2018   Chest CT Sept 2019   Constipation    Degenerative disc disease at L5-S1 level 09/28/2016   See ortho note May 2018   Depression    bipolar, hx of suicide attempt   Drug use    Dyspnea    with exertion   GERD (gastroesophageal reflux disease)    H/O suicide attempt    slit wrists   Hepatitis C 06/26/2014   Hip pain    History of alcohol abuse    History of diverticulitis 2013   History of hepatitis C    HEP "C"--three years ago   History of MRSA infection 2013   HLD (hyperlipidemia)    Hypertension    Hypothyroidism    Incisional hernia 11/08/2012   Knee pain    MRSA carrier Nov 2013   Multinodular thyroid  OSA (obstructive sleep apnea)    managed by Dr. Manuella Ghazi   Osteoporosis    Post-traumatic osteoarthritis of right knee 09/24/2015   Prediabetes    Recurrent ventral hernia 11/08/2012   Sleep apnea     Dx 3 years ago. Use C-PAP   Status post total right knee replacement using cement 05/10/2016   Thyroid disease    Goiter   Vitamin B12 deficiency    Vitamin D deficiency disease     Past Surgical History:  Procedure Laterality Date   BILATERAL SALPINGOOPHORECTOMY     due to abnormal mass   BREAST BIOPSY Left    neg   BREAST SURGERY Left 20 yrs ago   CESAREAN SECTION     COLONOSCOPY     COLONOSCOPY WITH PROPOFOL N/A 10/16/2017   Procedure: COLONOSCOPY WITH  PROPOFOL;  Surgeon: Jonathon Bellows, MD;  Location: [facility] ENDOSCOPY;  Service: Gastroenterology;  Laterality: N/A;   COLONOSCOPY WITH PROPOFOL N/A 02/16/2021   Procedure: COLONOSCOPY WITH PROPOFOL;  Surgeon: Jonathon Bellows, MD;  Location: [facility] ENDOSCOPY;  Service: Gastroenterology;  Laterality: N/A;   HERNIA REPAIR  06/2011, July 2014   Ventral wall repair with Physiomesh   HERNIA REPAIR     2nd.vental wall repair   JOINT REPLACEMENT Right    knee   TONSILLECTOMY     TOTAL HIP ARTHROPLASTY Left 07/23/2019   Procedure: TOTAL HIP ARTHROPLASTY;  Surgeon: Corky Mull, MD;  Location: [facility] ORS;  Service: Orthopedics;  Laterality: Left;   TOTAL KNEE ARTHROPLASTY Right 05/10/2016   Procedure: TOTAL KNEE ARTHROPLASTY;  Surgeon: Corky Mull, MD;  Location: [facility] ORS;  Service: Orthopedics;  Laterality: Right;   TUBAL LIGATION      Family History  Problem Relation Age of Onset   Arthritis Mother    Asthma Mother    Mental illness Mother    Thyroid disease Mother    COPD Mother    Heart disease Mother    Congestive Heart Failure Mother    Alcohol abuse Mother    Eating disorder Mother    Bipolar disorder Mother    Alcohol abuse Father    Heart attack Father    Alcohol abuse Sister    Drug abuse Sister    Mental illness Sister    Mental illness Sister    Fibromyalgia Sister    Obesity Sister    Pneumonia Sister    Depression Sister    Mental illness Sister    Alcohol abuse Sister    Drug abuse Sister    Arthritis Brother    Mental illness Brother    Cancer Brother        non-hodkins lymphoma   Drug abuse Daughter    Drug abuse Son    Alcohol abuse Son    Drug abuse Son    Alcohol abuse Son    Diabetes Neg Hx    Stroke Neg Hx    Breast cancer Neg Hx    Thyroid cancer Neg Hx     Social History   Socioeconomic History   Marital status: Widowed    Spouse name: Not on file   Number of children: 3   Years of education: Not on file   Highest education level: Some college, no degree   Occupational History   Occupation: disabled  Tobacco Use   Smoking status: Every Day    Packs/day: 0.75    Years: 41.00    Pack years: 30.75    Types: Cigarettes   Smokeless tobacco:  Never  Vaping Use   Vaping Use: Former   Quit date: 07/15/2017  Substance and Sexual Activity   Alcohol use: No    Alcohol/week: 0.0 standard drinks    Comment: sober since October 2018   Drug use: No    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   Sexual activity: Not Currently  Other Topics Concern   Not on file  Social History Narrative   Not on file   Social Determinants of Health   Financial Resource Strain: Low Risk    Difficulty of Paying Living Expenses: Not very hard  Food Insecurity: No Food Insecurity   Worried About Charity fundraiser in the Last Year: Never true   [place] in the Last Year: Never true  Transportation Needs: No Transportation Needs   Lack of Transportation (Medical): No   Lack of Transportation (Non-Medical): No  Physical Activity: Inactive   Days of Exercise per Week: 0 days   Minutes of Exercise per Session: 0 min  Stress: No Stress Concern Present   Feeling of Stress : Only a little  Social Connections: Moderately Isolated   Frequency of Communication with Friends and Family: More than three times a week   Frequency of Social Gatherings with Friends and Family: More than three times a week   Attends Religious Services: Never   Marine scientist or Organizations: Yes   Attends Music therapist: More than 4 times per year   Marital Status: Widowed  Human resources officer Violence: Not At Risk   Fear of Current or Ex-Partner: No   Emotionally Abused: No   Physically Abused: No   Sexually Abused: No    Outpatient Medications Prior to Visit  Medication Sig Dispense Refill   ACCU-CHEK GUIDE test strip TEST UP TO FOUR TIMES A DAY 100 each 0   Accu-Chek Softclix Lancets lancets TEST 4 TIMES A DAY 100 each 1   albuterol (VENTOLIN  HFA) 108 (90 Base) MCG/ACT inhaler Inhale 2 puffs into the lungs every 4 (four) hours as needed for wheezing or shortness of breath. 18 g 2   atorvastatin (LIPITOR) 10 MG tablet TAKE 1 TABLET (10 MG TOTAL) BY MOUTH AT BEDTIME. 90 tablet 3   B-D UF III MINI PEN NEEDLES 31G X 5 MM MISC USE WITH OZEMPIC 100 each 1   blood glucose meter kit and supplies Dispense based on patient and insurance preference. Use up to four times daily as directed. (FOR ICD-10 E10.9, E11.9). 1 each 0   clindamycin (CLEOCIN T) 1 % external solution Apply to aa's scalp QD PRN 60 mL 3   clotrimazole (LOTRIMIN) 1 % cream Apply 1 application topically 2 (two) times daily. 30 g 0   cyclobenzaprine (FLEXERIL) 5 MG tablet Take 5 mg by mouth 2 (two) times daily as needed for muscle spasms.      DULoxetine (CYMBALTA) 60 MG capsule TAKE 1 CAPSULE TWICE A DAY 180 capsule 0   gabapentin (NEURONTIN) 300 MG capsule Take 600 mg by mouth 2 (two) times daily.      gabapentin (NEURONTIN) 300 MG capsule Take 2 capsules by mouth 2 (two) times daily.     HYDROcodone-acetaminophen (NORCO/VICODIN) 5-325 MG tablet      Lancet Devices (TRUEDRAW LANCING DEVICE) MISC      LATUDA 60 MG TABS TAKE 1 TABLET BY MOUTH DAILY WITH SUPPER 30 tablet 1   meloxicam (MOBIC) 15 MG tablet Take by mouth.  Multiple Vitamin (MULTIVITAMIN) tablet Take 1 tablet by mouth daily.     omeprazole (PRILOSEC) 20 MG capsule Take 1 capsule (20 mg total) by mouth daily as needed. Take 30 mins to 1 hr before breakfast 90 capsule 1   Semaglutide,0.25 or 0.5MG/DOS, (OZEMPIC, 0.25 OR 0.5 MG/DOSE,) 2 MG/1.5ML SOPN Inject 0.5 mg into the skin once a week. 3 mL 2   spironolactone (ALDACTONE) 25 MG tablet Take 1 tablet (25 mg total) by mouth daily. 90 tablet 1   telmisartan (MICARDIS) 80 MG tablet Take 1 tablet (80 mg total) by mouth daily. 30 tablet 2   carvedilol (COREG) 12.5 MG tablet Take 1 tablet (12.5 mg total) by mouth 2 (two) times daily with a meal. 60 tablet 2    Fluticasone-Umeclidin-Vilant (TRELEGY ELLIPTA) 100-62.5-25 MCG/INH AEPB Inhale 1 puff into the lungs daily. (Patient not taking: Reported on 04/28/2021) 28 each 11   No facility-administered medications prior to visit.    Allergies  Allergen Reactions   Wellbutrin [Bupropion]     Patient reports made " deathly sick " years ago at appointment 03/18/20.   Lasix [Furosemide] Other (See Comments)    Electrolyte imbalance    Review of Systems  Constitutional:  Positive for fatigue. Negative for activity change, appetite change, chills, diaphoresis, fever and unexpected weight change.  HENT: Negative.    Respiratory:  Positive for cough, chest tightness (None today but has been reported over the last 2 months.) and shortness of breath. Negative for apnea, choking, wheezing and stridor.   Cardiovascular:  Positive for chest pain (None today but has been reported over the last 2 months, she did have COVID in November.).  Gastrointestinal: Negative.   Genitourinary: Negative.   Musculoskeletal: Negative.   Neurological: Negative.   Hematological: Negative.   Psychiatric/Behavioral: Negative.        Objective:    Physical Exam Vitals reviewed.  Constitutional:      General: She is not in acute distress.    Appearance: She is well-developed. She is not diaphoretic.     Interventions: She is not intubated. HENT:     Head: Normocephalic and atraumatic.     Salivary Glands: Right salivary gland is not diffusely enlarged or tender. Left salivary gland is not diffusely enlarged or tender.     Right Ear: External ear normal.     Left Ear: External ear normal.     Nose: Nose normal.     Mouth/Throat:     Pharynx: No oropharyngeal exudate.  Eyes:     General: Lids are normal. No scleral icterus.       Right eye: No discharge.        Left eye: No discharge.     Conjunctiva/sclera: Conjunctivae normal.     Right eye: Right conjunctiva is not injected. No exudate or hemorrhage.    Left eye:  Left conjunctiva is not injected. No exudate or hemorrhage.    Pupils: Pupils are equal, round, and reactive to light.  Neck:     Thyroid: No thyroid mass, thyromegaly or thyroid tenderness.     Vascular: Normal carotid pulses. Carotid bruit (Possible slight very faint bruit heard on left carotid.) present. No hepatojugular reflux or JVD.     Trachea: Trachea and phonation normal. No tracheal tenderness, abnormal tracheal secretions or tracheal deviation.     Meningeal: Brudzinski's sign and Kernig's sign absent.  Cardiovascular:     Rate and Rhythm: Normal rate and regular rhythm.     Pulses: Normal  pulses.          Radial pulses are 2+ on the right side and 2+ on the left side.       Dorsalis pedis pulses are 2+ on the right side and 2+ on the left side.       Posterior tibial pulses are 2+ on the right side and 2+ on the left side.     Heart sounds: Normal heart sounds, S1 normal and S2 normal. Heart sounds not distant. No murmur heard.   No friction rub. No gallop.  Pulmonary:     Effort: Pulmonary effort is normal. No tachypnea, bradypnea, accessory muscle usage or respiratory distress. She is not intubated.     Breath sounds: Normal breath sounds. No stridor. No wheezing or rales.  Chest:     Chest wall: No tenderness.  Abdominal:     General: Bowel sounds are normal. There is no distension or abdominal bruit.     Palpations: Abdomen is soft. There is no shifting dullness, fluid wave, hepatomegaly, splenomegaly, mass or pulsatile mass.     Tenderness: There is no abdominal tenderness. There is no guarding or rebound.     Hernia: No hernia is present.  Musculoskeletal:        General: No tenderness or deformity. Normal range of motion.     Cervical back: Full passive range of motion without pain, normal range of motion and neck supple. No edema, erythema or rigidity. No spinous process tenderness or muscular tenderness. Normal range of motion.  Lymphadenopathy:     Head:     Right  side of head: No submental, submandibular, tonsillar, preauricular, posterior auricular or occipital adenopathy.     Left side of head: No submental, submandibular, tonsillar, preauricular, posterior auricular or occipital adenopathy.     Cervical: No cervical adenopathy.     Right cervical: No superficial, deep or posterior cervical adenopathy.    Left cervical: No superficial, deep or posterior cervical adenopathy.     Upper Body:     Right upper body: No supraclavicular or pectoral adenopathy.     Left upper body: No supraclavicular or pectoral adenopathy.  Skin:    General: Skin is warm and dry.     Coloration: Skin is not pale.     Findings: No abrasion, bruising, burn, ecchymosis, erythema, lesion, petechiae or rash.     Nails: There is no clubbing.  Neurological:     Mental Status: She is alert and oriented to person, place, and time.     GCS: GCS eye subscore is 4. GCS verbal subscore is 5. GCS motor subscore is 6.     Cranial Nerves: No cranial nerve deficit.     Sensory: No sensory deficit.     Motor: No tremor, atrophy, abnormal muscle tone or seizure activity.     Coordination: Coordination normal.     Gait: Gait normal.     Deep Tendon Reflexes: Reflexes are normal and symmetric. Reflexes normal.     Reflex Scores:      Tricep reflexes are 2+ on the right side and 2+ on the left side.      Patellar reflexes are 2+ on the right side and 2+ on the left side.      Achilles reflexes are 2+ on the right side and 2+ on the left side.    Comments: Patient appers well, not sickly. Speaking in complete sentences. Patient moves on and off of exam table and in room without difficulty. Gait  is normal in hall and in room. Patient is oriented to person place time and situation. Patient answers questions appropriately and engages eye contact and verbal dialect with provider.    Psychiatric:        Speech: Speech normal.        Behavior: Behavior normal.        Thought Content: Thought  content normal.        Judgment: Judgment normal.    BP (!) 156/98    Pulse 70    Temp 97.7 F (36.5 C)    Ht 5' 2.01" (1.575 m)    Wt 214 lb 3.2 oz (97.2 kg)    SpO2 98%    BMI 39.17 kg/m  Wt Readings from Last 3 Encounters:  04/28/21 214 lb 3.2 oz (97.2 kg)  04/12/21 209 lb (94.8 kg)  03/24/21 209 lb (94.8 kg)    Health Maintenance Due  Topic Date Due   MAMMOGRAM  03/16/2018    There are no preventive care reminders to display for this patient.   Lab Results  Component Value Date   TSH 1.09 10/16/2019   Lab Results  Component Value Date   WBC 10.6 (H) 04/12/2021   HGB 13.7 04/12/2021   HCT 39.9 04/12/2021   MCV 96.1 04/12/2021   PLT 220 04/12/2021   Lab Results  Component Value Date   NA 135 04/20/2021   K 3.5 04/20/2021   CO2 28 04/20/2021   GLUCOSE 65 (L) 04/20/2021   BUN 10 04/20/2021   CREATININE 0.92 04/20/2021   BILITOT 0.4 04/12/2021   ALKPHOS 66 04/12/2021   AST 16 04/12/2021   ALT 17 04/12/2021   PROT 7.2 04/12/2021   ALBUMIN 4.1 04/12/2021   CALCIUM 9.3 04/20/2021   ANIONGAP 6 04/12/2021   GFR 67.81 04/20/2021   Lab Results  Component Value Date   CHOL 151 02/10/2021   Lab Results  Component Value Date   HDL 55.20 02/10/2021   Lab Results  Component Value Date   LDLCALC 71 02/10/2021   Lab Results  Component Value Date   TRIG 124.0 02/10/2021   Lab Results  Component Value Date   CHOLHDL 3 02/10/2021   Lab Results  Component Value Date   HGBA1C 5.5 02/03/2021       Assessment & Plan:   Problem List Items Addressed This Visit       Cardiovascular and Mediastinum   Uncontrolled hypertension   Relevant Medications   carvedilol (COREG) 25 MG tablet   Other Relevant Orders   Ambulatory referral to Cardiology     Other   Nonspecific chest pain - Primary   Relevant Orders   EKG 12-Lead (Completed)   CT Angio Chest Pulmonary Embolism (PE) W or WO Contrast   ECHOCARDIOGRAM COMPLETE   CK Total (and CKMB)   Troponin I  (High Sensitivity)   Ambulatory referral to Cardiology   Hyperlipidemia   Relevant Medications   carvedilol (COREG) 25 MG tablet   Tobacco use disorder   Relevant Orders   Ambulatory referral to Cardiology   Morbid obesity [facility])   Relevant Orders   Ambulatory referral to Cardiology   Nervousness   History of COVID-19   Relevant Orders   CT Angio Chest Pulmonary Embolism (PE) W or WO Contrast   ECHOCARDIOGRAM COMPLETE   Ambulatory referral to Cardiology   Left carotid bruit   Relevant Orders   US Carotid Duplex Bilateral   Ambulatory referral to Cardiology   Dyspnea  Relevant Orders   Magnesium   CK Total (and CKMB)   Troponin I (High Sensitivity)   Ambulatory referral to Cardiology   DG Chest 2 View (Completed)   CBC with Differential/Platelet   Comprehensive metabolic panel   Magnesium   TSH   Aldosterone + renin activity w/ ratio   ECHOCARDIOGRAM COMPLETE   D-Dimer, Quantitative   CK Total (and CKMB)   Troponin I (High Sensitivity)   Ambulatory referral to Cardiology   Patient was referred to cardiology on 12 6 has not heard from that referral will place referral again.  EKG was reviewed by me she does have tolerated QRS complexes in leads V4 V5, however this is consistent with her previous EKG done in January, EKG sinus rhythm within normal limits. She is not actively having any chest pain at this time, however given her persistent cough we need to rule out pulmonary embolism, possible slight carotid bruit on the left carotid artery we will order ultrasound carotids.  Echocardiogram post-COVID history and new onset of increased elevated hypertension.  She will have labs today and a chest x-ray   Strict ER precautions were discussed with patient and she knows to go the emergency room if she has shortness of breath chest pain or any worsening or changing symptoms at any time.  Or call 911 and not to self drop. She should get a call regarding stat test that were ordered  today for scheduling those as well from the office if she does not hear she should give [name] a call. She has about she should hear from cardiology within 1 week.  Patient is in no acute distress at today's visit.  Must rule out pulmonary embolism, aortic aneurysm, carotid blockage.  History of COVID in November.   On statin Lipitor 10 mg once daily.  Meds ordered this encounter  Medications   carvedilol (COREG) 25 MG tablet    Sig: Take 1 tablet (25 mg total) by mouth 2 (two) times daily with a meal. Monitor heart rate if going below 60 need to notify the office, seek care if afterhours.    Dispense:  60 tablet    Refill:  1   Medications Discontinued During This Encounter  Medication Reason   carvedilol (COREG) 12.5 MG tablet Completed Course  Increasing carvedilol to 25 mg twice daily, she will discontinue Coreg 12.5 mg twice daily.  She will monitor heart rate and call if it is below parameters given or above.  Hypertension and hypertensive parameters given as well she will keep a blood pressure log morning and evening a since increasing medication and return to the office in 2 weeks for recheck.  [place] ER precautions given.  Smoking cessation advised.  Return in about 2 weeks (around 05/12/2021), or if symptoms worsen or fail to improve, for at any time for any worsening symptoms, Go to Emergency room/ urgent care if worse.   Marcille Buffy, FNP

## 2021-04-28 ENCOUNTER — Telehealth: Payer: Self-pay | Admitting: Adult Health

## 2021-04-28 ENCOUNTER — Encounter: Payer: Self-pay | Admitting: Adult Health

## 2021-04-28 ENCOUNTER — Ambulatory Visit (INDEPENDENT_AMBULATORY_CARE_PROVIDER_SITE_OTHER): Payer: Medicare Other | Admitting: Adult Health

## 2021-04-28 ENCOUNTER — Other Ambulatory Visit: Payer: Medicare Other

## 2021-04-28 ENCOUNTER — Ambulatory Visit (INDEPENDENT_AMBULATORY_CARE_PROVIDER_SITE_OTHER): Payer: Medicare Other

## 2021-04-28 ENCOUNTER — Other Ambulatory Visit: Payer: Self-pay

## 2021-04-28 VITALS — BP 156/98 | HR 70 | Temp 97.7°F | Ht 62.01 in | Wt 214.2 lb

## 2021-04-28 DIAGNOSIS — R0609 Other forms of dyspnea: Secondary | ICD-10-CM | POA: Diagnosis not present

## 2021-04-28 DIAGNOSIS — R0600 Dyspnea, unspecified: Secondary | ICD-10-CM | POA: Diagnosis not present

## 2021-04-28 DIAGNOSIS — I10 Essential (primary) hypertension: Secondary | ICD-10-CM

## 2021-04-28 DIAGNOSIS — R450 Nervousness: Secondary | ICD-10-CM | POA: Diagnosis not present

## 2021-04-28 DIAGNOSIS — Z8616 Personal history of COVID-19: Secondary | ICD-10-CM

## 2021-04-28 DIAGNOSIS — R079 Chest pain, unspecified: Secondary | ICD-10-CM | POA: Diagnosis not present

## 2021-04-28 DIAGNOSIS — F17200 Nicotine dependence, unspecified, uncomplicated: Secondary | ICD-10-CM

## 2021-04-28 DIAGNOSIS — R0989 Other specified symptoms and signs involving the circulatory and respiratory systems: Secondary | ICD-10-CM

## 2021-04-28 DIAGNOSIS — E785 Hyperlipidemia, unspecified: Secondary | ICD-10-CM

## 2021-04-28 LAB — COMPREHENSIVE METABOLIC PANEL
ALT: 12 U/L (ref 0–35)
AST: 16 U/L (ref 0–37)
Albumin: 4.3 g/dL (ref 3.5–5.2)
Alkaline Phosphatase: 67 U/L (ref 39–117)
BUN: 13 mg/dL (ref 6–23)
CO2: 25 mEq/L (ref 19–32)
Calcium: 9.3 mg/dL (ref 8.4–10.5)
Chloride: 99 mEq/L (ref 96–112)
Creatinine, Ser: 1.01 mg/dL (ref 0.40–1.20)
GFR: 60.61 mL/min (ref >60.00)
Glucose, Bld: 78 mg/dL (ref 70–99)
Potassium: 4.3 mEq/L (ref 3.5–5.1)
Sodium: 132 mEq/L — ABNORMAL LOW (ref 135–145)
Total Bilirubin: 0.8 mg/dL (ref 0.2–1.2)
Total Protein: 6.6 g/dL (ref 6.0–8.3)

## 2021-04-28 LAB — MAGNESIUM: Magnesium: 2.1 mg/dL (ref 1.5–2.5)

## 2021-04-28 LAB — TROPONIN I (HIGH SENSITIVITY): High Sens Troponin I: 7 ng/L (ref 2–17)

## 2021-04-28 LAB — TSH: TSH: 1.15 u[IU]/mL (ref 0.35–5.50)

## 2021-04-28 MED ORDER — CARVEDILOL 25 MG PO TABS: 25.0000 mg | ORAL_TABLET | Freq: Two times a day (BID) | ORAL | 1 refills | Status: DC

## 2021-04-28 NOTE — Progress Notes (Signed)
Stable mild heart enlargement- non specific- ECHO has been ordered.

## 2021-04-28 NOTE — Patient Instructions (Addendum)
I have followed up on cardiology refferal sent on 04/13/21 you should hear within a  week.  If you develop chest pain, or  any worsening symptoms you need to call 911 and go to the emergency room for further work up.   Meds ordered this encounter  Medications   carvedilol (COREG) 25 MG tablet    Sig: Take 1 tablet (25 mg total) by mouth 2 (two) times daily with a meal. Monitor heart rate if going below 60 need to notify the office, seek care if afterhours.    Dispense:  60 tablet    Refill:  1     Medications Discontinued During This Encounter  Medication Reason   carvedilol (COREG) 12.5 MG tablet Completed Course   Monitor heart rate if less than 60 need to follow up and if after hours and dizzy or lightheaded seek care.   Monitor blood pressure check one in the morning and once in the evening. Keep log of this and heart rate.   Cough, Adult A cough helps to clear your throat and lungs. A cough may be a sign of an illness or another medical condition. An acute cough may only last 2-3 weeks, while a chronic cough may last 8 or more weeks. Many things can cause a cough. They include: Germs (viruses or bacteria) that attack the airway. Breathing in things that bother (irritate) your lungs. Allergies. Asthma. Mucus that runs down the back of your throat (postnasal drip). Smoking. Acid backing up from the stomach into the tube that moves food from the mouth to the stomach (gastroesophageal reflux). Some medicines. Lung problems. Other medical conditions, such as heart failure or a blood clot in the lung (pulmonary embolism). Follow these instructions at home: Medicines Take over-the-counter and prescription medicines only as told by your doctor. Talk with your doctor before you take medicines that stop a cough (cough suppressants). Lifestyle  Do not smoke, and try not to be around smoke. Do not use any products that contain nicotine or tobacco, such as cigarettes, e-cigarettes, and  chewing tobacco. If you need help quitting, ask your doctor. Drink enough fluid to keep your pee (urine) pale yellow. Avoid caffeine. Do not drink alcohol if your doctor tells you not to drink. General instructions  Watch for any changes in your cough. Tell your doctor about them. Always cover your mouth when you cough. Stay away from things that make you cough, such as perfume, candles, campfire smoke, or cleaning products. If the air is dry, use a cool mist vaporizer or humidifier in your home. If your cough is worse at night, try using extra pillows to raise your head up higher while you sleep. Rest as needed. Keep all follow-up visits as told by your doctor. This is important. Contact a doctor if: You have new symptoms. You cough up pus. Your cough does not get better after 2-3 weeks, or your cough gets worse. Cough medicine does not help your cough and you are not sleeping well. You have pain that gets worse or pain that is not helped with medicine. You have a fever. You are losing weight and you do not know why. You have night sweats. Get help right away if: You cough up blood. You have trouble breathing. Your heartbeat is very fast. These symptoms may be an emergency. Do not wait to see if the symptoms will go away. Get medical help right away. Call your local emergency services (911 in the [place] Do not drive yourself  to the hospital. Summary A cough helps to clear your throat and lungs. Many things can cause a cough. Take over-the-counter and prescription medicines only as told by your doctor. Always cover your mouth when you cough. Contact a doctor if you have new symptoms or you have a cough that does not get better or gets worse. This information is not intended to replace advice given to you by your health care provider. Make sure you discuss any questions you have with your health care provider. Document Revised: 06/14/2019 Document Reviewed: 05/14/2018 Elsevier  Patient Education  [place]. Hypertension, Adult High blood pressure (hypertension) is when the force of blood pumping through the arteries is too strong. The arteries are the blood vessels that carry blood from the heart throughout the body. Hypertension forces the heart to work harder to pump blood and may cause arteries to become narrow or stiff. Untreated or uncontrolled hypertension can cause a heart attack, heart failure, a stroke, kidney disease, and other problems. A blood pressure reading consists of a higher number over a lower number. Ideally, your blood pressure should be below 120/80. The first ("top") number is called the systolic pressure. It is a measure of the pressure in your arteries as your heart beats. The second ("bottom") number is called the diastolic pressure. It is a measure of the pressure in your arteries as the heart relaxes. What are the causes? The exact cause of this condition is not known. There are some conditions that result in or are related to high blood pressure. What increases the risk? Some risk factors for high blood pressure are under your control. The following factors may make you more likely to develop this condition: Smoking. Having type 2 diabetes mellitus, high cholesterol, or both. Not getting enough exercise or physical activity. Being overweight. Having too much fat, sugar, calories, or salt (sodium) in your diet. Drinking too much alcohol. Some risk factors for high blood pressure may be difficult or impossible to change. Some of these factors include: Having chronic kidney disease. Having a family history of high blood pressure. Age. Risk increases with age. Race. You may be at higher risk if you are African American. Gender. Men are at higher risk than women before age 56. After age 48, women are at higher risk than men. Having obstructive sleep apnea. Stress. What are the signs or symptoms? High blood pressure may not cause  symptoms. Very high blood pressure (hypertensive crisis) may cause: Headache. Anxiety. Shortness of breath. Nosebleed. Nausea and vomiting. Vision changes. Severe chest pain. Seizures. How is this diagnosed? This condition is diagnosed by measuring your blood pressure while you are seated, with your arm resting on a flat surface, your legs uncrossed, and your feet flat on the floor. The cuff of the blood pressure monitor will be placed directly against the skin of your upper arm at the level of your heart. It should be measured at least twice using the same arm. Certain conditions can cause a difference in blood pressure between your right and left arms. Certain factors can cause blood pressure readings to be lower or higher than normal for a short period of time: When your blood pressure is higher when you are in a health care provider's office than when you are at home, this is called white coat hypertension. Most people with this condition do not need medicines. When your blood pressure is higher at home than when you are in a health care provider's office, this is  called masked hypertension. Most people with this condition may need medicines to control blood pressure. If you have a high blood pressure reading during one visit or you have normal blood pressure with other risk factors, you may be asked to: Return on a different day to have your blood pressure checked again. Monitor your blood pressure at home for 1 week or longer. If you are diagnosed with hypertension, you may have other blood or imaging tests to help your health care provider understand your overall risk for other conditions. How is this treated? This condition is treated by making healthy lifestyle changes, such as eating healthy foods, exercising more, and reducing your alcohol intake. Your health care provider may prescribe medicine if lifestyle changes are not enough to get your blood pressure under control, and if: Your  systolic blood pressure is above 130. Your diastolic blood pressure is above 80. Your personal target blood pressure may vary depending on your medical conditions, your age, and other factors. Follow these instructions at home: Eating and drinking  Eat a diet that is high in fiber and potassium, and low in sodium, added sugar, and fat. An example eating plan is called the DASH (Dietary Approaches to Stop Hypertension) diet. To eat this way: Eat plenty of fresh fruits and vegetables. Try to fill one half of your plate at each meal with fruits and vegetables. Eat whole grains, such as whole-wheat pasta, brown rice, or whole-grain bread. Fill about one fourth of your plate with whole grains. Eat or drink low-fat dairy products, such as skim milk or low-fat yogurt. Avoid fatty cuts of meat, processed or cured meats, and poultry with skin. Fill about one fourth of your plate with lean proteins, such as fish, chicken without skin, beans, eggs, or tofu. Avoid pre-made and processed foods. These tend to be higher in sodium, added sugar, and fat. Reduce your daily sodium intake. Most people with hypertension should eat less than 1,500 mg of sodium a day. Do not drink alcohol if: Your health care provider tells you not to drink. You are pregnant, may be pregnant, or are planning to become pregnant. If you drink alcohol: Limit how much you use to: 0-1 drink a day for women. 0-2 drinks a day for men. Be aware of how much alcohol is in your drink. In the [place] one drink equals one 12 oz bottle of beer (355 mL), one 5 oz glass of wine (148 mL), or one 1 oz glass of hard liquor (44 mL). Lifestyle  Work with your health care provider to maintain a healthy body weight or to lose weight. Ask what an ideal weight is for you. Get at least 30 minutes of exercise most days of the week. Activities may include walking, swimming, or biking. Include exercise to strengthen your muscles (resistance exercise), such as  Pilates or lifting weights, as part of your weekly exercise routine. Try to do these types of exercises for 30 minutes at least 3 days a week. Do not use any products that contain nicotine or tobacco, such as cigarettes, e-cigarettes, and chewing tobacco. If you need help quitting, ask your health care provider. Monitor your blood pressure at home as told by your health care provider. Keep all follow-up visits as told by your health care provider. This is important. Medicines Take over-the-counter and prescription medicines only as told by your health care provider. Follow directions carefully. Blood pressure medicines must be taken as prescribed. Do not skip doses of blood pressure medicine.  Doing this puts you at risk for problems and can make the medicine less effective. Ask your health care provider about side effects or reactions to medicines that you should watch for. Contact a health care provider if you: Think you are having a reaction to a medicine you are taking. Have headaches that keep coming back (recurring). Feel dizzy. Have swelling in your ankles. Have trouble with your vision. Get help right away if you: Develop a severe headache or confusion. Have unusual weakness or numbness. Feel faint. Have severe pain in your chest or abdomen. Vomit repeatedly. Have trouble breathing. Summary Hypertension is when the force of blood pumping through your arteries is too strong. If this condition is not controlled, it may put you at risk for serious complications. Your personal target blood pressure may vary depending on your medical conditions, your age, and other factors. For most people, a normal blood pressure is less than 120/80. Hypertension is treated with lifestyle changes, medicines, or a combination of both. Lifestyle changes include losing weight, eating a healthy, low-sodium diet, exercising more, and limiting alcohol. This information is not intended to replace advice given to  you by your health care provider. Make sure you discuss any questions you have with your health care provider. Document Revised: 01/03/2018 Document Reviewed: 01/03/2018 Elsevier Patient Education  [place].

## 2021-04-28 NOTE — Telephone Encounter (Signed)
Lft pt vm to call ofc to sch STAT CT. thanks 

## 2021-04-28 NOTE — Addendum Note (Signed)
Addended by: Leeanne Rio on: 04/28/2021 03:42 PM   Modules accepted: Orders

## 2021-04-28 NOTE — Telephone Encounter (Signed)
Lft message with pt daughter to call ofc to sch STAT Ct. thanks

## 2021-04-28 NOTE — Telephone Encounter (Signed)
Patient returned referrals phone call. STAT Referral

## 2021-04-29 ENCOUNTER — Ambulatory Visit: Payer: Medicare Other

## 2021-04-29 NOTE — Progress Notes (Signed)
D Dimer is mildly elevated. Shows patient had CT angiogram provider ordered scheduled today 04/29/21  but did not show for imaging. It is very important she has this scan to rule out a blood clot in her lungs, CMP sodium is again mildly elevated, try some liquid IV, vitamin waters. May need to again adjust medications. Not over hydrating ? Diarrhea ?  Magnesium is within normal limits.  TSH is within normal limits.  Troponin negative. Other labs still pending.  Follow treatment plan from office  if not improving or any worsening within 72 hours and also return to office or open medical facility at ANYTIME if any symptoms persist, change, or worsen or you have any further concerns or questions. Call 911 immediately for emergencies.   Coreg was increased from 12.5 mg BID to 25mg  due to hypertension.  CBC was not drawn she should return scheduled for this.  Please be sure renin and aldosterone lab was drawn.  She does see cardiology 12/30/224 Need CT angio rescheduled asap.

## 2021-04-30 ENCOUNTER — Ambulatory Visit
Admission: RE | Admit: 2021-04-30 | Discharge: 2021-04-30 | Disposition: A | Discharge status: Home / Self Care | Payer: Medicare Other | Source: Ambulatory Visit | Attending: Adult Health | Admitting: Adult Health

## 2021-04-30 ENCOUNTER — Telehealth: Payer: Self-pay

## 2021-04-30 ENCOUNTER — Other Ambulatory Visit: Payer: Self-pay

## 2021-04-30 DIAGNOSIS — R079 Chest pain, unspecified: Secondary | ICD-10-CM | POA: Insufficient documentation

## 2021-04-30 DIAGNOSIS — R0989 Other specified symptoms and signs involving the circulatory and respiratory systems: Secondary | ICD-10-CM | POA: Insufficient documentation

## 2021-04-30 DIAGNOSIS — Z8616 Personal history of COVID-19: Secondary | ICD-10-CM | POA: Insufficient documentation

## 2021-04-30 MED ORDER — IOHEXOL 350 MG/ML SOLN
75.0000 mL | Freq: Once | INTRAVENOUS | Status: AC | PRN
  Administered 2021-04-30: 15:00:00 75 mL via INTRAVENOUS

## 2021-04-30 NOTE — Telephone Encounter (Signed)
Opened in error

## 2021-04-30 NOTE — Progress Notes (Signed)
CT angiogram does not demonstrate any pulmonary embolism.  Mildly enlarged heart size, echocardiogram has been ordered patient should be hearing from scheduling for this in the near future.  Scan also shows enlarged thyroid with coarse calcifications similar to appearance of CT of the neck in 2015 no further evaluation is recommended given that she has already had treatment for her thyroid gland.  If she has any concerns or problems swallowing enlarging areas in her neck she is to return for follow-up. Advised her to keep her appointment with cardiology that was scheduled 05/07/2021 and continue with work-up ordered in office with primary care echocardiogram and carotid ultrasounds.

## 2021-04-30 NOTE — Progress Notes (Signed)
Right carotid artery on carotid ultrasound shows mild plaque formation, degree of narrowing is less than 50%. Left carotid artery shows similar moderate plaque formation without significant stenosis or hardening. Overall minor carotid arteriosclerosis.  Less than 50% bilateral by ultrasound criteria for narrowing.

## 2021-04-30 NOTE — Telephone Encounter (Signed)
LMTCB for lab results.  

## 2021-04-30 NOTE — Progress Notes (Signed)
Susan [name]  If she is staying above 125/65 at this point I am ok with this as long as no associated dizziness or lightheadedness. She was just increased to Coreg 25mg  by mouth twice daily. She needs to give the medication time to work. Test were ordered she sees cardiology 05/07/21.  This was just started yesterday.  But of course as always over the holiday weekend if any   if not improving or any worsening within 72 hours and also return to office or open medical facility at ANYTIME if any symptoms persist, change, or worsen or you have any further concerns or questions. Call 911 immediately for emergencies.   She should check blood pressure in the am one day and evening the next keep log of that and her hear rate. Sit and relax for 10 minutes, with feet on the floor and be sure she is using a brachial cuff, bring cuff to next visit. Should not be using a wrist cuff.   We made need to discuss with psychiatry regarding Cymbalta if all test return normal.   [name]- would you have time to talk with her by phone

## 2021-05-04 ENCOUNTER — Telehealth: Payer: Self-pay

## 2021-05-04 LAB — ALDOSTERONE + RENIN ACTIVITY W/ RATIO
ALDO / PRA Ratio: 21.7 Ratio (ref 0.9–28.9)
Aldosterone: 5 ng/dL
Renin Activity: 0.23 ng/mL/h — ABNORMAL LOW (ref 0.25–5.82)

## 2021-05-04 LAB — CK TOTAL AND CKMB (NOT AT ARMC)
CK, MB: 1.5 ng/mL (ref 0–5.0)
Relative Index: 1.7 (ref 0–4.0)
Total CK: 86 U/L (ref 29–143)

## 2021-05-04 LAB — D-DIMER, QUANTITATIVE: D-Dimer, Quant: 0.51 mcg/mL FEU — ABNORMAL HIGH (ref <0.50)

## 2021-05-04 NOTE — Telephone Encounter (Signed)
LMTCB for results. 

## 2021-05-05 ENCOUNTER — Other Ambulatory Visit: Payer: Self-pay | Admitting: Adult Health

## 2021-05-05 DIAGNOSIS — R7989 Other specified abnormal findings of blood chemistry: Secondary | ICD-10-CM

## 2021-05-05 DIAGNOSIS — I10 Essential (primary) hypertension: Secondary | ICD-10-CM

## 2021-05-05 NOTE — Progress Notes (Signed)
Ordered bilateral upper and lower extremity DVT US. Kristal please let patient know.

## 2021-05-05 NOTE — Progress Notes (Signed)
Orders Placed This Encounter  Procedures   US Venous Img Upper Bilat (DVT)    Order Specific Question:   Reason for Exam (SYMPTOM  OR DIAGNOSIS REQUIRED)    Answer:   d dimer positive rule out clot    Order Specific Question:   Preferred imaging location?    Answer:   [facility]   US Venous Img Lower Bilateral (DVT)    Order Specific Question:   Reason for Exam (SYMPTOM  OR DIAGNOSIS REQUIRED)    Answer:   d dimer positive rule out DVT    Order Specific Question:   Preferred imaging location?    Answer:   [facility]

## 2021-05-07 ENCOUNTER — Encounter: Payer: Self-pay | Admitting: Cardiology

## 2021-05-07 ENCOUNTER — Ambulatory Visit (INDEPENDENT_AMBULATORY_CARE_PROVIDER_SITE_OTHER): Payer: Medicare Other | Admitting: Cardiology

## 2021-05-07 ENCOUNTER — Other Ambulatory Visit: Payer: Self-pay

## 2021-05-07 ENCOUNTER — Other Ambulatory Visit: Payer: Self-pay | Admitting: Adult Health

## 2021-05-07 VITALS — BP 160/94 | HR 72 | Ht 62.0 in | Wt 215.0 lb

## 2021-05-07 DIAGNOSIS — R0600 Dyspnea, unspecified: Secondary | ICD-10-CM | POA: Diagnosis not present

## 2021-05-07 DIAGNOSIS — I10 Essential (primary) hypertension: Secondary | ICD-10-CM

## 2021-05-07 DIAGNOSIS — Z6839 Body mass index (BMI) 39.0-39.9, adult: Secondary | ICD-10-CM | POA: Diagnosis not present

## 2021-05-07 DIAGNOSIS — F17200 Nicotine dependence, unspecified, uncomplicated: Secondary | ICD-10-CM

## 2021-05-07 DIAGNOSIS — J432 Centrilobular emphysema: Secondary | ICD-10-CM

## 2021-05-07 DIAGNOSIS — R0683 Snoring: Secondary | ICD-10-CM

## 2021-05-07 DIAGNOSIS — I498 Other specified cardiac arrhythmias: Secondary | ICD-10-CM

## 2021-05-07 MED ORDER — AMLODIPINE BESYLATE 5 MG PO TABS
5.0000 mg | ORAL_TABLET | Freq: Every day | ORAL | 3 refills | Status: DC
Start: 2021-05-07 — End: 2021-06-02

## 2021-05-07 NOTE — Progress Notes (Signed)
Orders Placed This Encounter  Procedures   US Renal Artery Stenosis   Ambulatory referral to Pulmonology   Ambulatory referral to Sleep Studies

## 2021-05-07 NOTE — Progress Notes (Signed)
Cardiology Office Note:    Date:  05/07/2021   ID:  Susan [name], DOB May 10, 1960, MRN [id number]  PCP:  Doreen Beam, FNP   [facility] Providers Cardiologist:  Kate Sable, MD     Referring MD: Sharmon Leyden*   Chief Complaint  Patient presents with   office visit-Referred by Laverna Peace, NP    Eval of DOE, elevated BP, chest discomfort, and palpitations; Patient states she was diagnosed with COVID almost 2 months ago and symptoms have been present since then.    History of Present Illness:    Susan [name] is a 60 y.o. female with a hx of anxiety, hypertension, hyperlipidemia, current smoker x35+ years, OSA on CPAP, COPD, obesity who presents due for fluttering heartbeat.  Patient states having symptoms of heart fluttering yesterday when she left for work.  She states being anxious at the moment.  Denies dizziness, presyncope or syncope.  Has not had any similar symptoms before, has not had any episodes since.  She also states her blood pressure has been difficult to control.  Systolics usually greater than 160 at home.  She denies chest pain, endorses shortness of breath with exertion.   Prior notes Echo 01/2019 EF 00%, normal diastolic function.  Past Medical History:  Diagnosis Date   ADD (attention deficit disorder)    Alcohol abuse    Anemia    Anxiety    Aortic atherosclerosis ([place]) 02/20/2018   Chest CT Sept 2019   Arthritis    rheumatoid arthritis   Asthma    Bipolar disorder ([place])    Centrilobular emphysema ([place]) 02/20/2018   Chest CT Sept 2019   Constipation    Degenerative disc disease at L5-S1 level 09/28/2016   See ortho note May 2018   Depression    bipolar, hx of suicide attempt   Drug use    Dyspnea    with exertion   GERD (gastroesophageal reflux disease)    H/O suicide attempt    slit wrists   Hepatitis C 06/26/2014   Hip pain    History of alcohol abuse    History of diverticulitis 2013   History of  hepatitis C    HEP "C"--three years ago   History of MRSA infection 2013   HLD (hyperlipidemia)    Hypertension    Hypothyroidism    Incisional hernia 11/08/2012   Knee pain    MRSA carrier Nov 2013   Multinodular thyroid    OSA (obstructive sleep apnea)    managed by Dr. Manuella Ghazi   Osteoporosis    Post-traumatic osteoarthritis of right knee 09/24/2015   Prediabetes    Recurrent ventral hernia 11/08/2012   Sleep apnea     Dx 3 years ago. Use C-PAP   Status post total right knee replacement using cement 05/10/2016   Thyroid disease    Goiter   Vitamin B12 deficiency    Vitamin D deficiency disease     Past Surgical History:  Procedure Laterality Date   BILATERAL SALPINGOOPHORECTOMY     due to abnormal mass   BREAST BIOPSY Left    neg   BREAST SURGERY Left 20 yrs ago   CESAREAN SECTION     COLONOSCOPY     COLONOSCOPY WITH PROPOFOL N/A 10/16/2017   Procedure: COLONOSCOPY WITH PROPOFOL;  Surgeon: Jonathon Bellows, MD;  [facility];  Service: Gastroenterology;  Laterality: N/A;   COLONOSCOPY WITH PROPOFOL N/A 02/16/2021   Procedure: COLONOSCOPY WITH PROPOFOL;  Surgeon: Vicente Males,  Bailey Mech, MD;  Location: [place];  Service: Gastroenterology;  Laterality: N/A;   HERNIA REPAIR  06/2011, July 2014   Ventral wall repair with Physiomesh   HERNIA REPAIR     2nd.vental wall repair   JOINT REPLACEMENT Right    knee   TONSILLECTOMY     TOTAL HIP ARTHROPLASTY Left 07/23/2019   Procedure: TOTAL HIP ARTHROPLASTY;  Surgeon: Corky Mull, MD;  Location: [facility] ORS;  Service: Orthopedics;  Laterality: Left;   TOTAL KNEE ARTHROPLASTY Right 05/10/2016   Procedure: TOTAL KNEE ARTHROPLASTY;  Surgeon: Corky Mull, MD;  Location: [facility] ORS;  Service: Orthopedics;  Laterality: Right;   TUBAL LIGATION      Current Medications: Current Meds  Medication Sig   ACCU-CHEK GUIDE test strip TEST UP TO FOUR TIMES A DAY   Accu-Chek Softclix Lancets lancets TEST 4 TIMES A DAY   albuterol (VENTOLIN HFA) 108  (90 Base) MCG/ACT inhaler Inhale 2 puffs into the lungs every 4 (four) hours as needed for wheezing or shortness of breath.   amLODipine (NORVASC) 5 MG tablet Take 1 tablet (5 mg total) by mouth daily.   atorvastatin (LIPITOR) 10 MG tablet TAKE 1 TABLET (10 MG TOTAL) BY MOUTH AT BEDTIME.   B-D UF III MINI PEN NEEDLES 31G X 5 MM MISC USE WITH OZEMPIC   blood glucose meter kit and supplies Dispense based on patient and insurance preference. Use up to four times daily as directed. (FOR ICD-10 E10.9, E11.9).   carvedilol (COREG) 25 MG tablet Take 1 tablet (25 mg total) by mouth 2 (two) times daily with a meal. Monitor heart rate if going below 60 need to notify the office, seek care if afterhours.   clindamycin (CLEOCIN T) 1 % external solution Apply to aa's scalp QD PRN   clotrimazole (LOTRIMIN) 1 % cream Apply 1 application topically 2 (two) times daily as needed.   cyclobenzaprine (FLEXERIL) 5 MG tablet Take 5 mg by mouth 2 (two) times daily as needed for muscle spasms.    DULoxetine (CYMBALTA) 60 MG capsule TAKE 1 CAPSULE TWICE A DAY   gabapentin (NEURONTIN) 300 MG capsule Take 600 mg by mouth 2 (two) times daily.    HYDROcodone-acetaminophen (NORCO/VICODIN) 5-325 MG tablet    Lancet Devices (TRUEDRAW LANCING DEVICE) MISC    LATUDA 60 MG TABS TAKE 1 TABLET BY MOUTH DAILY WITH SUPPER   meloxicam (MOBIC) 15 MG tablet Take by mouth.   Multiple Vitamin (MULTIVITAMIN) tablet Take 1 tablet by mouth daily.   omeprazole (PRILOSEC) 20 MG capsule Take 1 capsule (20 mg total) by mouth daily as needed. Take 30 mins to 1 hr before breakfast   Semaglutide,0.25 or 0.5MG/DOS, (OZEMPIC, 0.25 OR 0.5 MG/DOSE,) 2 MG/1.5ML SOPN Inject 0.5 mg into the skin once a week.   spironolactone (ALDACTONE) 25 MG tablet Take 1 tablet (25 mg total) by mouth daily.   telmisartan (MICARDIS) 80 MG tablet Take 1 tablet (80 mg total) by mouth daily.     Allergies:   Wellbutrin [bupropion] and Lasix [furosemide]   Social History    Socioeconomic History   Marital status: Widowed    Spouse name: Not on file   Number of children: 3   Years of education: Not on file   Highest education level: Some college, no degree  Occupational History   Occupation: disabled  Tobacco Use   Smoking status: Every Day    Packs/day: 0.75    Years: 41.00    Pack years: 30.75  Types: Cigarettes   Smokeless tobacco: Never  Vaping Use   Vaping Use: Former   Quit date: 07/15/2017  Substance and Sexual Activity   Alcohol use: No    Alcohol/week: 0.0 standard drinks    Comment: sober since October 2018   Drug use: No    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   Sexual activity: Not Currently  Other Topics Concern   Not on file  Social History Narrative   Not on file   Social Determinants of Health   Financial Resource Strain: Low Risk    Difficulty of Paying Living Expenses: Not very hard  Food Insecurity: No Food Insecurity   Worried About Charity fundraiser in the Last Year: Never true   [place] in the Last Year: Never true  Transportation Needs: No Transportation Needs   Lack of Transportation (Medical): No   Lack of Transportation (Non-Medical): No  Physical Activity: Inactive   Days of Exercise per Week: 0 days   Minutes of Exercise per Session: 0 min  Stress: No Stress Concern Present   Feeling of Stress : Only a little  Social Connections: Moderately Isolated   Frequency of Communication with Friends and Family: More than three times a week   Frequency of Social Gatherings with Friends and Family: More than three times a week   Attends Religious Services: Never   Marine scientist or Organizations: Yes   Attends Music therapist: More than 4 times per year   Marital Status: Widowed     Family History: The patient's family history includes Alcohol abuse in her father, mother, sister, sister, son, and son; Arthritis in her brother and mother; Asthma in her mother;  Bipolar disorder in her mother; COPD in her mother; Cancer in her brother; Congestive Heart Failure in her mother; Depression in her sister; Drug abuse in her daughter, sister, sister, son, and son; Eating disorder in her mother; Fibromyalgia in her sister; Heart attack in her father; Heart disease in her mother; Mental illness in her brother, mother, sister, sister, and sister; Obesity in her sister; Pneumonia in her sister; Thyroid disease in her mother. There is no history of Diabetes, Stroke, Breast cancer, or Thyroid cancer.  ROS:   Please see the history of present illness.     All other systems reviewed and are negative.  EKGs/Labs/Other Studies Reviewed:    The following studies were reviewed today:   EKG:  EKG is  ordered today.  The ekg ordered today demonstrates normal sinus rhythm, normal ECG  Recent Labs: 04/12/2021: Hemoglobin 13.7; Platelets 220 04/28/2021: ALT 12; BUN 13; Creatinine, Ser 1.01; Magnesium 2.1; Potassium 4.3; Sodium 132; TSH 1.15  Recent Lipid Panel    Component Value Date/Time   CHOL 151 02/10/2021 0846   CHOL 158 07/26/2018 1101   TRIG 124.0 02/10/2021 0846   HDL 55.20 02/10/2021 0846   HDL 44 07/26/2018 1101   CHOLHDL 3 02/10/2021 0846   VLDL 24.8 02/10/2021 0846   LDLCALC 71 02/10/2021 0846   LDLCALC 79 08/21/2019 1122     Risk Assessment/Calculations:         Physical Exam:    VS:  BP (!) 160/94 (BP Location: Left Arm, Patient Position: Sitting, Cuff Size: Large)    Pulse 72    Ht '5\' 2"'  (1.575 m)    Wt 215 lb (97.5 kg)    SpO2 97%    BMI 39.32 kg/m  Wt Readings from Last 3 Encounters:  05/07/21 215 lb (97.5 kg)  04/28/21 214 lb 3.2 oz (97.2 kg)  04/12/21 209 lb (94.8 kg)     GEN:  Well nourished, well developed in no acute distress HEENT: Normal NECK: No JVD; No carotid bruits LYMPHATICS: No lymphadenopathy CARDIAC: RRR, no murmurs, rubs, gallops RESPIRATORY:  Clear to auscultation without rales, wheezing or rhonchi  ABDOMEN:  Soft, non-tender, non-distended MUSCULOSKELETAL:  No edema; No deformity  SKIN: Warm and dry NEUROLOGIC:  Alert and oriented x 3 PSYCHIATRIC:  Normal affect   ASSESSMENT:    1. Dyspnea, unspecified type   2. Primary hypertension   3. Smoking   4. BMI 39.0-39.9,adult   5. Fluttering heart    PLAN:    In order of problems listed above:  Dyspnea on exertion.  Prior echo with normal systolic and diastolic function.  Symptoms do not appear to be cardiac in origin or angina.  etiology likely includes combination of COPD, morbid obesity, OSA. Hypertension.  BP elevated.  Start Norvasc 5 mg daily .  Continue Coreg, Aldactone, telmisartan. Current smoker, cessation advised. obesity, low-calorie diet, weight loss recommended. Episode of heart fluttering x1.  Associated with anxiety.  Not consistent with cardiac etiology.  Continue to monitor symptoms.  Follow-up in 3 months.    Medication Adjustments/Labs and Tests Ordered: Current medicines are reviewed at length with the patient today.  Concerns regarding medicines are outlined above.  Orders Placed This Encounter  Procedures   EKG 12-Lead   Meds ordered this encounter  Medications   amLODipine (NORVASC) 5 MG tablet    Sig: Take 1 tablet (5 mg total) by mouth daily.    Dispense:  90 tablet    Refill:  3    Patient Instructions  Medication Instructions: Your physician has recommended you make the following change in your medication:   START Norvasc (amlodipine) 5 mg daily   *If you need a refill on your cardiac medications before your next appointment, please call your pharmacy*   Lab Work: None ordered  If you have labs (blood work) drawn today and your tests are completely normal, you will receive your results only by: [place] (if you have MyChart) OR A paper copy in the mail If you have any lab test that is abnormal or we need to change your treatment, we will call you to review the  results.   Testing/Procedures: None ordered   Follow-Up: At [facility], you and your health needs are our priority.  As part of our continuing mission to provide you with exceptional heart care, we have created designated Provider Care Teams.  These Care Teams include your primary Cardiologist (physician) and Advanced Practice Providers (APPs -  Physician Assistants and Nurse Practitioners) who all work together to provide you with the care you need, when you need it.  We recommend signing up for the patient portal called "MyChart".  Sign up information is provided on this After Visit Summary.  MyChart is used to connect with patients for Virtual Visits (Telemedicine).  Patients are able to view lab/test results, encounter notes, upcoming appointments, etc.  Non-urgent messages can be sent to your provider as well.   To learn more about what you can do with MyChart, go to [email or web address].    Your next appointment:   3 month(s)  The format for your next appointment:   In Person  Provider:   You may see Kate Sable, MD or one of the following Advanced  Practice Providers on your designated Care Team:   Murray Hodgkins, NP Christell Faith, PA-C Cadence Kathlen Mody, [place]  :1}    Other Instructions N/A    Signed, Kate Sable, MD  05/07/2021 4:57 PM    [facility]

## 2021-05-07 NOTE — Patient Instructions (Signed)
Medication Instructions: Your physician has recommended you make the following change in your medication:   START Norvasc (amlodipine) 5 mg daily   *If you need a refill on your cardiac medications before your next appointment, please call your pharmacy*   Lab Work: None ordered  If you have labs (blood work) drawn today and your tests are completely normal, you will receive your results only by: [place] (if you have MyChart) OR A paper copy in the mail If you have any lab test that is abnormal or we need to change your treatment, we will call you to review the results.   Testing/Procedures: None ordered   Follow-Up: At [facility], you and your health needs are our priority.  As part of our continuing mission to provide you with exceptional heart care, we have created designated Provider Care Teams.  These Care Teams include your primary Cardiologist (physician) and Advanced Practice Providers (APPs -  Physician Assistants and Nurse Practitioners) who all work together to provide you with the care you need, when you need it.  We recommend signing up for the patient portal called "MyChart".  Sign up information is provided on this After Visit Summary.  MyChart is used to connect with patients for Virtual Visits (Telemedicine).  Patients are able to view lab/test results, encounter notes, upcoming appointments, etc.  Non-urgent messages can be sent to your provider as well.   To learn more about what you can do with MyChart, go to [email or web address].    Your next appointment:   3 month(s)  The format for your next appointment:   In Person  Provider:   You may see Kate Sable, MD or one of the following Advanced Practice Providers on your designated Care Team:   Murray Hodgkins, NP Christell Faith, PA-C Cadence Kathlen Mody, PA-C  :1}    Other Instructions N/A

## 2021-05-10 ENCOUNTER — Ambulatory Visit (INDEPENDENT_AMBULATORY_CARE_PROVIDER_SITE_OTHER): Payer: No Typology Code available for payment source | Admitting: Psychology

## 2021-05-10 DIAGNOSIS — F3289 Other specified depressive episodes: Secondary | ICD-10-CM | POA: Diagnosis not present

## 2021-05-10 NOTE — Progress Notes (Signed)
[place] Counselor/Therapist Progress Note  Patient ID: Susan [name], MRN: [id number]   Date: 05/10/21  Time Spent: 10:07  am - 10:47 am : 40 Minutes  Treatment Type: Individual Therapy.  Reported Symptoms: Anxiety and interpersonal stressors.    Mental Status Exam: Appearance:  NA     Behavior: Appropriate  Motor: Normal  Speech/Language:  Negative  Affect: Congruent  Mood: anxious  Thought process: normal  Thought content:   WNL  Sensory/Perceptual disturbances:   WNL  Orientation: oriented to person, place, time/date, and situation  Attention: Good  Concentration: Good  Memory: WNL  Fund of knowledge:  Good  Insight:   Good  Judgment:  Good  Impulse Control: Good   Risk Assessment: Danger to Self:  No Self-injurious Behavior: This Danger to Others: No Duty to Warn:no Physical Aggression / Violence:No  Access to Firearms a concern: No  Gang Involvement:No   Subjective:   Susan [name] participated from home, via phone and consented to treatment. Therapist participated from home office. We met online due to Susan [name] pandemic. Susan [name] reviewed the events of the past week.  Sheera noted a heightened level of anxiety due to her unmanaged hypertension. She noted her family's lack of follow-up with her during this time and noted feelings of sadness, as a result. We worked on feeling identification during the session. She noted her worry her health and discussed catastrophizing. We explored this during the session and ways to manage this via self-talk including challenging negative thoughts, setting boundaries for self, and reminding self of progress. Therapist encouraged Mana to engage in consistent breathing and relaxation exercises during the week and prior to her blood pressure readings but to discontinue should there be increased tension or difficulty breathing. Therapist praised [place] for her effort and energy between and during sessions and provided  supportive therapy.  Interventions: Interpersonal and relaxation  Diagnosis: Other depression   Plan: Patient is to use CBT, BA, Problem Solving, Solution Focused, mindfulness,  and coping skills to help manage decrease symptoms associated with their diagnosis.   Related Problem: Alleviate depressive symptoms and return to previous level of effective functioning. Description: Learn and implement problem-solving and decision-making skills. Target Date: 2022-04-19 Progress: 0%  Related Problem: Appropriately grieve the loss in order to normalize mood and to return to previously adaptive level of functioning. Description: Use mindfulness and acceptance strategies to reduce experiential and cognitive avoidance and increase value-based behavior. Target Date: 2022-04-19 Progress: 0%  Related Problem: Appropriately grieve the loss in order to normalize mood and to return to previously adaptive level of functioning. Description: Increasingly verbalize hopeful and positive statements regarding self, others, and the future. Target Date: 2022-04-19 Progress: 20%  Related Problem: Appropriately grieve the loss in order to normalize mood and to return to previously adaptive level of functioning. Description: Describe current and past experiences with depression including their impact on functioning and attempts to resolve it. Target Date: 2022-04-19 Progress: 10%  Related Problem: Appropriately grieve the loss in order to normalize mood and to return to previously adaptive level of functioning. Description: Learn and implement conflict resolution skills to resolve interpersonal problems. Target Date: 2022-04-19 Progress: 30%  Related Problem: Appropriately grieve the loss in order to normalize mood and to return to previously adaptive level of functioning. Description: Verbalize an understanding and resolution of current interpersonal problems. Target Date: 2022-04-19  Progress:  20%  Related Problem: Appropriately grieve the loss in order to normalize mood and to return to previously adaptive  level of functioning. Description: Implement mindfulness techniques for relapse prevention. Target Date: 2022-04-19 Progress: 10%  Related Problem: Learn and implement coping skills that result in a reduction of anxiety and worry, and improved daily functioning. Description: Reestablish a consistent sleep-wake cycle. Target Date: 2022-04-19 Progress: 10%  Related Problem: Learn and implement coping skills that result in a reduction of anxiety and worry, and improved daily functioning. Description: Learn to accept limitations in life and commit to tolerating, rather than avoiding, unpleasant emotions while accomplishing meaningful goals. Target Date: 2022-04-19 Progress: 10%  Related Problem: Learn and implement coping skills that result in a reduction of anxiety and worry, and improved daily functioning. Description: Learn and implement problem-solving strategies for realistically addressing worries. Target Date: 2022-04-19 Progress: 10%  Related Problem: Learn and implement coping skills that result in a reduction of anxiety and worry, and improved daily functioning. Description: Identify, challenge, and replace biased, fearful self-talk with positive, realistic, and empowering self-talk. Target Date: 2022-04-19 Progress: 20%  Related Problem: Learn and implement coping skills that result in a reduction of anxiety and worry, and improved daily functioning. Description: Learn and implement a strategy to limit the association between various environmental settings and worry, delaying the worry until a designated "worry time." Target Date: 2022-04-19 Progress: 0%  Related Problem: Learn and implement coping skills that result in a reduction of anxiety and worry, and improved daily functioning. Description: Identify and engage in pleasant activities on a daily  basis. Target Date: 2022-04-19 Progress: 30%  Related Problem: Learn and implement coping skills that result in a reduction of anxiety and worry, and improved daily functioning. Description: Identify the major life conflicts from the past and present that form the basis for present anxiety. Target Date: 2022-04-19 Progress: 20%  Related Problem: Learn and implement coping skills that result in a reduction of anxiety and worry, and improved daily functioning. Description: Cooperate with a medication evaluation by a physician. Target Date: 2022-04-19 Progress: 100%  Buena Irish, LCSW

## 2021-05-11 ENCOUNTER — Telehealth: Payer: Self-pay | Admitting: Adult Health

## 2021-05-11 NOTE — Progress Notes (Signed)
Sent mychart message

## 2021-05-11 NOTE — Telephone Encounter (Signed)
Lft pt vm to call ofc to sch US's. Thank you!

## 2021-05-12 ENCOUNTER — Ambulatory Visit: Payer: Medicare Other

## 2021-05-14 ENCOUNTER — Telehealth: Payer: Self-pay | Admitting: Adult Health

## 2021-05-14 NOTE — Telephone Encounter (Signed)
error 

## 2021-05-16 NOTE — Progress Notes (Signed)
Noted  

## 2021-05-17 ENCOUNTER — Telehealth: Payer: Self-pay | Admitting: Adult Health

## 2021-05-17 NOTE — Telephone Encounter (Signed)
Can you tell me which one they chose to perform today and I can change the other order ? Would like the other one done ok ASAP, ok to give them a verbal order to change and I will cosign.

## 2021-05-17 NOTE — Telephone Encounter (Signed)
Nikki from [place] called in stated the order says STAT,  and the order need to be change to ASAP. Lexine Baton advised me that the Ultrasound tech stated it has to be Upper or lower ,DVT studies can not do both . Either upper and not lower, or lower and not upper, or the left side or the right side, or pick two of the patient limbs that are most swollen, Because each study is an hour long . This is too long for the patient and the Tech . Lexine Baton stated the Patient has a new appointment for renal artery duplex ultrasound and it is schedule on 05/20/21 at 9 am with 8:30am arrival

## 2021-05-19 ENCOUNTER — Ambulatory Visit (INDEPENDENT_AMBULATORY_CARE_PROVIDER_SITE_OTHER): Payer: Commercial Managed Care - HMO | Admitting: Adult Health

## 2021-05-19 ENCOUNTER — Telehealth: Payer: Self-pay | Admitting: Adult Health

## 2021-05-19 ENCOUNTER — Encounter: Payer: Self-pay | Admitting: Adult Health

## 2021-05-19 ENCOUNTER — Other Ambulatory Visit: Payer: Self-pay | Admitting: Psychiatry

## 2021-05-19 ENCOUNTER — Other Ambulatory Visit: Payer: Self-pay | Admitting: Family Medicine

## 2021-05-19 ENCOUNTER — Other Ambulatory Visit: Payer: Self-pay

## 2021-05-19 ENCOUNTER — Telehealth: Payer: Self-pay

## 2021-05-19 VITALS — BP 148/86 | HR 86 | Ht 62.01 in | Wt 213.0 lb

## 2021-05-19 DIAGNOSIS — J432 Centrilobular emphysema: Secondary | ICD-10-CM

## 2021-05-19 DIAGNOSIS — E119 Type 2 diabetes mellitus without complications: Secondary | ICD-10-CM

## 2021-05-19 DIAGNOSIS — Z8619 Personal history of other infectious and parasitic diseases: Secondary | ICD-10-CM | POA: Diagnosis not present

## 2021-05-19 DIAGNOSIS — E661 Drug-induced obesity: Secondary | ICD-10-CM

## 2021-05-19 DIAGNOSIS — Z8616 Personal history of COVID-19: Secondary | ICD-10-CM | POA: Diagnosis not present

## 2021-05-19 DIAGNOSIS — F3131 Bipolar disorder, current episode depressed, mild: Secondary | ICD-10-CM

## 2021-05-19 DIAGNOSIS — I10 Essential (primary) hypertension: Secondary | ICD-10-CM | POA: Diagnosis not present

## 2021-05-19 DIAGNOSIS — E0520 Thyrotoxicosis with toxic multinodular goiter without thyrotoxic crisis or storm: Secondary | ICD-10-CM

## 2021-05-19 DIAGNOSIS — Z6838 Body mass index (BMI) 38.0-38.9, adult: Secondary | ICD-10-CM

## 2021-05-19 LAB — COMPREHENSIVE METABOLIC PANEL
ALT: 13 U/L (ref 0–35)
AST: 12 U/L (ref 0–37)
Albumin: 4.5 g/dL (ref 3.5–5.2)
Alkaline Phosphatase: 68 U/L (ref 39–117)
BUN: 13 mg/dL (ref 6–23)
CO2: 28 mEq/L (ref 19–32)
Calcium: 9.2 mg/dL (ref 8.4–10.5)
Chloride: 100 mEq/L (ref 96–112)
Creatinine, Ser: 0.9 mg/dL (ref 0.40–1.20)
GFR: 69.58 mL/min (ref >60.00)
Glucose, Bld: 82 mg/dL (ref 70–99)
Potassium: 3.9 mEq/L (ref 3.5–5.1)
Sodium: 134 mEq/L — ABNORMAL LOW (ref 135–145)
Total Bilirubin: 0.6 mg/dL (ref 0.2–1.2)
Total Protein: 6.8 g/dL (ref 6.0–8.3)

## 2021-05-19 LAB — HEMOGLOBIN A1C: Hgb A1c MFr Bld: 5.7 % (ref 4.6–6.5)

## 2021-05-19 MED ORDER — SEMAGLUTIDE (1 MG/DOSE) 4 MG/3ML ~~LOC~~ SOPN: 1.0000 mg | PEN_INJECTOR | SUBCUTANEOUS | 1 refills | Status: DC

## 2021-05-19 NOTE — Telephone Encounter (Signed)
Juliana from [employer] [facility] called in asking for cpt code for pat, so she can record for a sleep study. Please call back

## 2021-05-19 NOTE — Progress Notes (Signed)
A1C is stable at 5.7. Sodium is improved and stable. Recommend cmp recheck in 1 month to be sure stable.

## 2021-05-19 NOTE — Telephone Encounter (Signed)
Provider no longer at practice Requested Prescriptions  Pending Prescriptions Disp Refills   ACCU-CHEK GUIDE test strip [Pharmacy Med Name: Accu-Chek Guide STRP] 100 each 0    Sig: TEST UP TO FOUR TIMES DAILY     Endocrinology: Diabetes - Testing Supplies Passed - 05/19/2021  4:00 PM      Passed - Valid encounter within last 12 months    Recent Outpatient Visits          8 months ago Type 2 diabetes mellitus without complication, without long-term current use of insulin ([place])   [facility], Kelby Aline, FNP   11 months ago Type 2 diabetes mellitus without complication, without long-term current use of insulin ([place])   [place] Flinchum, Kelby Aline, [place]   1 year ago Weight gain   [employer], Kelby Aline, FNP   1 year ago Essential hypertension   [place] Flinchum, Kelby Aline, [place]   1 year ago Essential hypertension   [employer] [facility], Wendee Beavers, PA-C      Future Appointments            In 4 weeks Flinchum, Kelby Aline, [place], [place]   In 2 months Sharolyn Douglas, Clance Boll, NP [facility], [place]

## 2021-05-19 NOTE — Patient Instructions (Signed)

## 2021-05-19 NOTE — Telephone Encounter (Signed)
Placed call to [facility]. No option to talk to the person that originally called. Unable to leave VM.

## 2021-05-19 NOTE — Progress Notes (Signed)
Established Patient Office Visit  Subjective:  Patient ID: Susan [name], female    DOB: 05-16-1960  Age: 61 y.o. MRN: [id number]  CC:  Chief Complaint  Patient presents with   Follow-up    HPI [name] presents for follow up on hypertension, fluttering heart beat. Covid 2 months ago and symptoms present since then. She is a smoker.  She is a chronic smoker. Echo was within normal.   She was refer to cardiology and seen Dr. Mylo Red- Etan for appointment on 05/07/21 . Norvasc 5 mg was added at that time. Continued coreg, aldactone, telmisartan.  Blood pressure is reading better at home now readings 409'W systolic, diastolic's around 11'B.   Doing well on ozempic 0.27m since November would like to go up on this dose. Feels well no reflux. She has had history of nodules. She had no cancerous nodules, and no history  of thyroid cancers personal or family.   2016 benign nodules removed per patient.  Will have patient follow up for her thyroid and give recommendations on Ozempic. She has no history of thyroid cancer.   Scheduled for renal artery duplex.  CT angio and UKoreacarotid no significant findings.   CXR mild stable cardiomegaly.   Patient  denies any fever, body aches,chills, rash, chest pain, shortness of breath, nausea, vomiting, or diarrhea.  Denies dizziness, lightheadedness, pre syncopal or syncopal episodes.    Past Medical History:  Diagnosis Date   ADD (attention deficit disorder)    Alcohol abuse    Anemia    Anxiety    Aortic atherosclerosis ([facility] 02/20/2018   Chest CT Sept 2019   Arthritis    rheumatoid arthritis   Asthma    Bipolar disorder ([facility]    Centrilobular emphysema ([facility] 02/20/2018   Chest CT Sept 2019   Constipation    Degenerative disc disease at L5-S1 level 09/28/2016   See ortho note May 2018   Depression    bipolar, hx of suicide attempt   Drug use    Dyspnea    with exertion   GERD (gastroesophageal reflux disease)    H/O  suicide attempt    slit wrists   Hepatitis C 06/26/2014   Hip pain    History of alcohol abuse    History of diverticulitis 2013   History of hepatitis C    HEP "C"--three years ago   History of MRSA infection 2013   HLD (hyperlipidemia)    Hypertension    Hypothyroidism    Incisional hernia 11/08/2012   Knee pain    MRSA carrier Nov 2013   Multinodular thyroid    OSA (obstructive sleep apnea)    managed by Dr. SManuella Ghazi  Osteoporosis    Post-traumatic osteoarthritis of right knee 09/24/2015   Prediabetes    Recurrent ventral hernia 11/08/2012   Sleep apnea     Dx 3 years ago. Use C-PAP   Status post total right knee replacement using cement 05/10/2016   Thyroid disease    Goiter   Vitamin B12 deficiency    Vitamin D deficiency disease     Past Surgical History:  Procedure Laterality Date   BILATERAL SALPINGOOPHORECTOMY     due to abnormal mass   BREAST BIOPSY Left    neg   BREAST SURGERY Left 20 yrs ago   CESAREAN SECTION     COLONOSCOPY     COLONOSCOPY WITH PROPOFOL N/A 10/16/2017   Procedure: COLONOSCOPY WITH PROPOFOL;  Surgeon: AJonathon Bellows  MD;  Location: [facility] ENDOSCOPY;  Service: Gastroenterology;  Laterality: N/A;   COLONOSCOPY WITH PROPOFOL N/A 02/16/2021   Procedure: COLONOSCOPY WITH PROPOFOL;  Surgeon: Jonathon Bellows, MD;  Location[facility] ENDOSCOPY;  Service: Gastroenterology;  Laterality: N/A;   HERNIA REPAIR  06/2011, July 2014   Ventral wall repair with Physiomesh   HERNIA REPAIR     2nd.vental wall repair   JOINT REPLACEMENT Right    knee   TONSILLECTOMY     TOTAL HIP ARTHROPLASTY Left 07/23/2019   Procedure: TOTAL HIP ARTHROPLASTY;  Surgeon: Corky Mull, MD;  Location: [facility] ORS;  Service: Orthopedics;  Laterality: Left;   TOTAL KNEE ARTHROPLASTY Right 05/10/2016   Procedure: TOTAL KNEE ARTHROPLASTY;  Surgeon: Corky Mull, MD;  Location: [facility] ORS;  Service: Orthopedics;  Laterality: Right;   TUBAL LIGATION      Family History  Problem Relation Age of Onset    Arthritis Mother    Asthma Mother    Mental illness Mother    Thyroid disease Mother    COPD Mother    Heart disease Mother    Congestive Heart Failure Mother    Alcohol abuse Mother    Eating disorder Mother    Bipolar disorder Mother    Alcohol abuse Father    Heart attack Father    Alcohol abuse Sister    Drug abuse Sister    Mental illness Sister    Mental illness Sister    Fibromyalgia Sister    Obesity Sister    Pneumonia Sister    Depression Sister    Mental illness Sister    Alcohol abuse Sister    Drug abuse Sister    Arthritis Brother    Mental illness Brother    Cancer Brother        non-hodkins lymphoma   Drug abuse Daughter    Drug abuse Son    Alcohol abuse Son    Drug abuse Son    Alcohol abuse Son    Diabetes Neg Hx    Stroke Neg Hx    Breast cancer Neg Hx    Thyroid cancer Neg Hx     Social History   Socioeconomic History   Marital status: Widowed    Spouse name: Not on file   Number of children: 3   Years of education: Not on file   Highest education level: Some college, no degree  Occupational History   Occupation: disabled  Tobacco Use   Smoking status: Every Day    Packs/day: 0.75    Years: 41.00    Pack years: 30.75    Types: Cigarettes   Smokeless tobacco: Never  Vaping Use   Vaping Use: Former   Quit date: 07/15/2017  Substance and Sexual Activity   Alcohol use: No    Alcohol/week: 0.0 standard drinks    Comment: sober since October 2018   Drug use: No    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   Sexual activity: Not Currently  Other Topics Concern   Not on file  Social History Narrative   Not on file   Social Determinants of Health   Financial Resource Strain: Low Risk    Difficulty of Paying Living Expenses: Not very hard  Food Insecurity: No Food Insecurity   Worried About Charity fundraiser in the Last Year: Never true   [place] in the Last Year: Never true  Transportation Needs: No  Transportation Needs   Lack of Transportation (  Medical): No   Lack of Transportation (Non-Medical): No  Physical Activity: Inactive   Days of Exercise per Week: 0 days   Minutes of Exercise per Session: 0 min  Stress: No Stress Concern Present   Feeling of Stress : Only a little  Social Connections: Moderately Isolated   Frequency of Communication with Friends and Family: More than three times a week   Frequency of Social Gatherings with Friends and Family: More than three times a week   Attends Religious Services: Never   Marine scientist or Organizations: Yes   Attends Music therapist: More than 4 times per year   Marital Status: Widowed  Human resources officer Violence: Not At Risk   Fear of Current or Ex-Partner: No   Emotionally Abused: No   Physically Abused: No   Sexually Abused: No    Outpatient Medications Prior to Visit  Medication Sig Dispense Refill   ACCU-CHEK GUIDE test strip TEST UP TO FOUR TIMES A DAY 100 each 0   Accu-Chek Softclix Lancets lancets TEST 4 TIMES A DAY 100 each 1   albuterol (VENTOLIN HFA) 108 (90 Base) MCG/ACT inhaler Inhale 2 puffs into the lungs every 4 (four) hours as needed for wheezing or shortness of breath. 18 g 2   amLODipine (NORVASC) 5 MG tablet Take 1 tablet (5 mg total) by mouth daily. 90 tablet 3   atorvastatin (LIPITOR) 10 MG tablet TAKE 1 TABLET (10 MG TOTAL) BY MOUTH AT BEDTIME. 90 tablet 3   B-D UF III MINI PEN NEEDLES 31G X 5 MM MISC USE WITH OZEMPIC 100 each 1   blood glucose meter kit and supplies Dispense based on patient and insurance preference. Use up to four times daily as directed. (FOR ICD-10 E10.9, E11.9). 1 each 0   carvedilol (COREG) 25 MG tablet Take 1 tablet (25 mg total) by mouth 2 (two) times daily with a meal. Monitor heart rate if going below 60 need to notify the office, seek care if afterhours. 60 tablet 1   clindamycin (CLEOCIN T) 1 % external solution Apply to aa's scalp QD PRN 60 mL 3    clotrimazole (LOTRIMIN) 1 % cream Apply 1 application topically 2 (two) times daily as needed.     cyclobenzaprine (FLEXERIL) 5 MG tablet Take 5 mg by mouth 2 (two) times daily as needed for muscle spasms.      DULoxetine (CYMBALTA) 60 MG capsule TAKE 1 CAPSULE TWICE A DAY 180 capsule 0   Fluticasone-Umeclidin-Vilant (TRELEGY ELLIPTA) 100-62.5-25 MCG/INH AEPB Inhale 1 puff into the lungs daily. 28 each 11   gabapentin (NEURONTIN) 300 MG capsule Take 600 mg by mouth 2 (two) times daily.      HYDROcodone-acetaminophen (NORCO/VICODIN) 5-325 MG tablet      Lancet Devices (TRUEDRAW LANCING DEVICE) MISC      meloxicam (MOBIC) 15 MG tablet Take by mouth.     Multiple Vitamin (MULTIVITAMIN) tablet Take 1 tablet by mouth daily.     omeprazole (PRILOSEC) 20 MG capsule Take 1 capsule (20 mg total) by mouth daily as needed. Take 30 mins to 1 hr before breakfast 90 capsule 1   spironolactone (ALDACTONE) 25 MG tablet Take 1 tablet (25 mg total) by mouth daily. 90 tablet 1   telmisartan (MICARDIS) 80 MG tablet Take 1 tablet (80 mg total) by mouth daily. 30 tablet 2   LATUDA 60 MG TABS TAKE 1 TABLET BY MOUTH DAILY WITH SUPPER 30 tablet 1   Semaglutide,0.25 or 0.5MG/DOS, (OZEMPIC,  0.25 OR 0.5 MG/DOSE,) 2 MG/1.5ML SOPN Inject 0.5 mg into the skin once a week. 3 mL 2   No facility-administered medications prior to visit.    Allergies  Allergen Reactions   Wellbutrin [Bupropion]     Patient reports made " deathly sick " years ago at appointment 03/18/20.   Lasix [Furosemide] Other (See Comments)    Electrolyte imbalance    ROS Review of Systems  Constitutional: Negative.   HENT: Negative.    Respiratory: Negative.    Cardiovascular: Negative.   Gastrointestinal: Negative.   Genitourinary: Negative.   Musculoskeletal: Negative.      Objective:    Physical Exam   General: Appearance:    Obese female in no acute distress  Eyes:    PERRL, conjunctiva/corneas clear, EOM's intact       Lungs:      Clear to auscultation bilaterally, respirations unlabored  Heart:    Normal heart rate. Normal rhythm. No murmurs, rubs, or gallops.    MS:   All extremities are intact.    Neurologic:   Awake, alert, oriented x 3. No apparent focal neurological           defect.      BP (!) 148/86    Pulse 86    Ht 5' 2.01" (1.575 m)    Wt 213 lb (96.6 kg)    SpO2 99%    BMI 38.95 kg/m  Wt Readings from Last 3 Encounters:  05/19/21 213 lb (96.6 kg)  05/07/21 215 lb (97.5 kg)  04/28/21 214 lb 3.2 oz (97.2 kg)     Health Maintenance Due  Topic Date Due   MAMMOGRAM  03/16/2018   COVID-19 Vaccine (3 - Booster for Pfizer series) 11/19/2019    There are no preventive care reminders to display for this patient.  Lab Results  Component Value Date   TSH 1.15 04/28/2021   Lab Results  Component Value Date   WBC 10.6 (H) 04/12/2021   HGB 13.7 04/12/2021   HCT 39.9 04/12/2021   MCV 96.1 04/12/2021   PLT 220 04/12/2021   Lab Results  Component Value Date   NA 134 (L) 05/19/2021   K 3.9 05/19/2021   CO2 28 05/19/2021   GLUCOSE 82 05/19/2021   BUN 13 05/19/2021   CREATININE 0.90 05/19/2021   BILITOT 0.6 05/19/2021   ALKPHOS 68 05/19/2021   AST 12 05/19/2021   ALT 13 05/19/2021   PROT 6.8 05/19/2021   ALBUMIN 4.5 05/19/2021   CALCIUM 9.2 05/19/2021   ANIONGAP 6 04/12/2021   GFR 69.58 05/19/2021   Lab Results  Component Value Date   CHOL 151 02/10/2021   Lab Results  Component Value Date   HDL 55.20 02/10/2021   Lab Results  Component Value Date   LDLCALC 71 02/10/2021   Lab Results  Component Value Date   TRIG 124.0 02/10/2021   Lab Results  Component Value Date   CHOLHDL 3 02/10/2021   Lab Results  Component Value Date   HGBA1C 5.7 05/19/2021      Assessment & Plan:   Problem List Items Addressed This Visit       Cardiovascular and Mediastinum   Uncontrolled hypertension   Relevant Orders   Comprehensive metabolic panel (Completed)     Respiratory    Centrilobular emphysema ([place])   Relevant Orders   Ambulatory referral to Pulmonology     Digestive   History of hepatitis C- treated 2014- 2015 with [facility] gastroenterology and followed.  Endocrine   Goiter colloid, toxic, nodular   Relevant Orders   Ambulatory referral to Endocrinology     Other   Class 3 severe obesity with serious comorbidity and body mass index (BMI) of 40.0 to 44.9 in adult [facility])   Relevant Medications   Semaglutide, 1 MG/DOSE, 4 MG/3ML SOPN   History of COVID-19   Relevant Orders   Ambulatory referral to Pulmonology   Other Visit Diagnoses     Type 2 diabetes mellitus without complication, without long-term current use of insulin (HCC)    -  Primary   Relevant Medications   Semaglutide, 1 MG/DOSE, 4 MG/3ML SOPN   Other Relevant Orders   Hemoglobin A1c (Completed)   Ambulatory referral to Endocrinology       Meds ordered this encounter  Medications   Semaglutide, 1 MG/DOSE, 4 MG/3ML SOPN    Sig: Inject 1 mg as directed once a week.    Dispense:  3 mL    Refill:  1    Red Flags discussed. The patient was given clear instructions to go to ER or return to medical center if any red flags develop, symptoms do not improve, worsen or new problems develop. They verbalized understanding.   Follow-up: Return in about 3 weeks (around 06/09/2021), or if symptoms worsen or fail to improve, for at any time for any worsening symptoms, Go to Emergency room/ urgent care if worse.    Marcille Buffy, FNP

## 2021-05-19 NOTE — Chronic Care Management (AMB) (Signed)
°  Care Management   Note  05/19/2021 Name: Susan [name] MRN: [id number] DOB: Oct 04, 1960  Susan [name] is a 61 y.o. year old female who is a primary care patient of Flinchum, Kelby Aline, FNP and is actively engaged with the care management team. I reached out to [facility] by phone today to assist with re-scheduling a follow up visit with the Pharmacist  Follow up plan: Unsuccessful telephone outreach attempt made. A HIPAA compliant phone message was left for the patient providing contact information and requesting a return call.  The care management team will reach out to the patient again over the next 3 days.  If patient returns call to provider office, please advise to call [facility] [place]  at [address], [place], [place], [place] [address] Direct Dial: [phone] [email or web address]: [email or web address]

## 2021-05-19 NOTE — Telephone Encounter (Signed)
Please call with codes for sleep apnea study  R06.8 snoring  E66.01 R53.83 As well as uncontrolled hypertension

## 2021-05-20 ENCOUNTER — Ambulatory Visit: Admission: RE | Admit: 2021-05-20 | Payer: Medicare Other | Source: Ambulatory Visit

## 2021-05-20 ENCOUNTER — Telehealth: Payer: Self-pay | Admitting: Adult Health

## 2021-05-20 ENCOUNTER — Telehealth: Payer: Self-pay

## 2021-05-20 DIAGNOSIS — E871 Hypo-osmolality and hyponatremia: Secondary | ICD-10-CM

## 2021-05-20 NOTE — Telephone Encounter (Signed)
Placed orders for CMP for future labs

## 2021-05-20 NOTE — Telephone Encounter (Signed)
Lft pt a vm to ask if pt is a current pt of Dr Gabriel Carina. I spoke with Sharyn Lull and pt is a current pt. thanks

## 2021-05-20 NOTE — Chronic Care Management (AMB) (Signed)
°  Care Management   Note  05/20/2021 Name: Susan [name] MRN: [id number] DOB: 1960-10-12  Susan [name] is a 61 y.o. year old female who is a primary care patient of Flinchum, Kelby Aline, FNP and is actively engaged with the care management team. I reached out to [facility] by phone today to assist with re-scheduling a follow up visit with the Pharmacist  Follow up plan: Telephone appointment with care management team member scheduled for:06/02/2021  [address], [address], [place], [place] [address] Direct Dial: [phone] [email or web address] Website: [email or web address]

## 2021-05-21 ENCOUNTER — Other Ambulatory Visit: Payer: Self-pay | Admitting: Family Medicine

## 2021-05-24 ENCOUNTER — Ambulatory Visit: Payer: Medicare Other | Admitting: Psychiatry

## 2021-05-26 ENCOUNTER — Ambulatory Visit: Payer: Medicare Other

## 2021-05-26 ENCOUNTER — Other Ambulatory Visit: Payer: Self-pay | Admitting: Adult Health

## 2021-05-26 ENCOUNTER — Other Ambulatory Visit: Payer: Medicare Other

## 2021-05-26 DIAGNOSIS — R7989 Other specified abnormal findings of blood chemistry: Secondary | ICD-10-CM

## 2021-05-26 DIAGNOSIS — Z6841 Body Mass Index (BMI) 40.0 and over, adult: Secondary | ICD-10-CM

## 2021-05-26 DIAGNOSIS — I10 Essential (primary) hypertension: Secondary | ICD-10-CM

## 2021-05-28 NOTE — Telephone Encounter (Signed)
Attempted to contact Susan [name] from [facility] No option to speak with her on prompts.

## 2021-05-31 ENCOUNTER — Other Ambulatory Visit: Payer: Self-pay | Admitting: Family Medicine

## 2021-05-31 ENCOUNTER — Other Ambulatory Visit: Payer: Self-pay | Admitting: Adult Health

## 2021-05-31 DIAGNOSIS — Z8639 Personal history of other endocrine, nutritional and metabolic disease: Secondary | ICD-10-CM | POA: Insufficient documentation

## 2021-05-31 DIAGNOSIS — E119 Type 2 diabetes mellitus without complications: Secondary | ICD-10-CM

## 2021-05-31 DIAGNOSIS — Z6838 Body mass index (BMI) 38.0-38.9, adult: Secondary | ICD-10-CM

## 2021-05-31 DIAGNOSIS — E661 Drug-induced obesity: Secondary | ICD-10-CM

## 2021-05-31 NOTE — Progress Notes (Addendum)
Orders Placed This Encounter  Procedures   Ambulatory referral to Endocrinology    Referral Priority:   Routine    Referral Type:   Consultation    Referral Reason:   Specialty Services Required    Number of Visits Requested:   1    Dr. Gabriel Carina not taking any new patients, new referral was placed. Provider received fax from Dr. Gabriel Carina office.

## 2021-05-31 NOTE — Progress Notes (Signed)
Noted thank you

## 2021-06-02 ENCOUNTER — Ambulatory Visit (INDEPENDENT_AMBULATORY_CARE_PROVIDER_SITE_OTHER): Payer: Medicare Other

## 2021-06-02 ENCOUNTER — Ambulatory Visit (INDEPENDENT_AMBULATORY_CARE_PROVIDER_SITE_OTHER): Payer: Medicare Other | Admitting: Pharmacist

## 2021-06-02 DIAGNOSIS — Z0000 Encounter for general adult medical examination without abnormal findings: Secondary | ICD-10-CM

## 2021-06-02 DIAGNOSIS — E119 Type 2 diabetes mellitus without complications: Secondary | ICD-10-CM

## 2021-06-02 DIAGNOSIS — E785 Hyperlipidemia, unspecified: Secondary | ICD-10-CM

## 2021-06-02 DIAGNOSIS — I700 Atherosclerosis of aorta: Secondary | ICD-10-CM

## 2021-06-02 DIAGNOSIS — I10 Essential (primary) hypertension: Secondary | ICD-10-CM

## 2021-06-02 MED ORDER — AMLODIPINE BESYLATE 10 MG PO TABS: 5.0000 mg | ORAL_TABLET | Freq: Every day | ORAL | 3 refills | Status: DC

## 2021-06-02 MED ORDER — AMLODIPINE BESYLATE 10 MG PO TABS: 10.0000 mg | ORAL_TABLET | Freq: Every day | ORAL | 3 refills | Status: DC

## 2021-06-02 NOTE — Patient Instructions (Addendum)
Susan [name],   Increase amlodipine to 10 mg daily and move to the evening.   Continue telmisartan 80 mg in the morning, spironolactone 25 mg in the morning, and carvedilol 25 mg twice daily.   Our goal blood pressure is less than 130/80. Please check your blood pressure daily. Write down these readings and bring to the next appointment with Susan [name]  Feel free to call me with any questions or concerns. I look forward to our next visit!  [name] Darnelle Maffucci, PharmD  Visit Information  Following are the goals we discussed today:  Patient Goals/Self-Care Activities patient will:  - take medications as prescribed as evidenced by patient report and record review check blood pressure once daily, document, and provide at future appointments          Plan: Telephone follow up appointment with care management team member scheduled for:  4 weeks   [name] Darnelle Maffucci, PharmD, [place], CPP Clinical Pharmacist [place] [facility] [phone]   Please call the care guide team at [phone] if you need to cancel or reschedule your appointment.   Patient verbalizes understanding of instructions and care plan provided today and agrees to view in [place]. Active MyChart status confirmed with patient.

## 2021-06-02 NOTE — Patient Instructions (Signed)
Susan [name] , Thank you for taking time to come for your Medicare Wellness Visit. I appreciate your ongoing commitment to your health goals. Please review the following plan we discussed and let me know if I can assist you in the future.   Screening recommendations/referrals: Colonoscopy: 02/16/21 Mammogram: has appointment next week Bone Density: n/d Recommended yearly ophthalmology/optometry visit for glaucoma screening and checkup Recommended yearly dental visit for hygiene and checkup  Vaccinations: Influenza vaccine: 02/03/21 Pneumococcal vaccine: 04/09/15 Tdap vaccine: 03/09/12 Shingles vaccine: n/d  Covid-19: 08/31/19, 09/24/19, pt states had 3rd shot at [place] directives: no  Conditions/risks identified: none  Next appointment: Follow up in one year for your annual wellness visit. 06/06/22 @ 11am by phone  Preventive Care 40-64 Years, Female Preventive care refers to lifestyle choices and visits with your health care provider that can promote health and wellness. What does preventive care include? A yearly physical exam. This is also called an annual well check. Dental exams once or twice a year. Routine eye exams. Ask your health care provider how often you should have your eyes checked. Personal lifestyle choices, including: Daily care of your teeth and gums. Regular physical activity. Eating a healthy diet. Avoiding tobacco and drug use. Limiting alcohol use. Practicing safe sex. Taking low-dose aspirin daily starting at age 36. Taking vitamin and mineral supplements as recommended by your health care provider. What happens during an annual well check? The services and screenings done by your health care provider during your annual well check will depend on your age, overall health, lifestyle risk factors, and family history of disease. Counseling  Your health care provider may ask you questions about your: Alcohol use. Tobacco use. Drug use. Emotional  well-being. Home and relationship well-being. Sexual activity. Eating habits. Work and work Statistician. Method of birth control. Menstrual cycle. Pregnancy history. Screening  You may have the following tests or measurements: Height, weight, and BMI. Blood pressure. Lipid and cholesterol levels. These may be checked every 5 years, or more frequently if you are over 61 years old. Skin check. Lung cancer screening. You may have this screening every year starting at age 24 if you have a 30-pack-year history of smoking and currently smoke or have quit within the past 15 years. Fecal occult blood test (FOBT) of the stool. You may have this test every year starting at age 32. Flexible sigmoidoscopy or colonoscopy. You may have a sigmoidoscopy every 5 years or a colonoscopy every 10 years starting at age 43. Hepatitis C blood test. Hepatitis B blood test. Sexually transmitted disease (STD) testing. Diabetes screening. This is done by checking your blood sugar (glucose) after you have not eaten for a while (fasting). You may have this done every 1-3 years. Mammogram. This may be done every 1-2 years. Talk to your health care provider about when you should start having regular mammograms. This may depend on whether you have a family history of breast cancer. BRCA-related cancer screening. This may be done if you have a family history of breast, ovarian, tubal, or peritoneal cancers. Pelvic exam and Pap test. This may be done every 3 years starting at age 12. Starting at age 71, this may be done every 5 years if you have a Pap test in combination with an HPV test. Bone density scan. This is done to screen for osteoporosis. You may have this scan if you are at high risk for osteoporosis. Discuss your test results, treatment options, and if necessary, the need  for more tests with your health care provider. Vaccines  Your health care provider may recommend certain vaccines, such as: Influenza  vaccine. This is recommended every year. Tetanus, diphtheria, and acellular pertussis (Tdap, Td) vaccine. You may need a Td booster every 10 years. Zoster vaccine. You may need this after age 93. Pneumococcal 13-valent conjugate (PCV13) vaccine. You may need this if you have certain conditions and were not previously vaccinated. Pneumococcal polysaccharide (PPSV23) vaccine. You may need one or two doses if you smoke cigarettes or if you have certain conditions. Talk to your health care provider about which screenings and vaccines you need and how often you need them. This information is not intended to replace advice given to you by your health care provider. Make sure you discuss any questions you have with your health care provider. Document Released: 05/22/2015 Document Revised: 01/13/2016 Document Reviewed: 02/24/2015 Elsevier Interactive Patient Education  2017 [name] Prevention in the Home Falls can cause injuries. They can happen to people of all ages. There are many things you can do to make your home safe and to help prevent falls. What can I do on the outside of my home? Regularly fix the edges of walkways and driveways and fix any cracks. Remove anything that might make you trip as you walk through a door, such as a raised step or threshold. Trim any bushes or trees on the path to your home. Use bright outdoor lighting. Clear any walking paths of anything that might make someone trip, such as rocks or tools. Regularly check to see if handrails are loose or broken. Make sure that both sides of any steps have handrails. Any raised decks and porches should have guardrails on the edges. Have any leaves, snow, or ice cleared regularly. Use sand or salt on walking paths during winter. Clean up any spills in your garage right away. This includes oil or grease spills. What can I do in the bathroom? Use night lights. Install grab bars by the toilet and in the tub and  shower. Do not use towel bars as grab bars. Use non-skid mats or decals in the tub or shower. If you need to sit down in the shower, use a plastic, non-slip stool. Keep the floor dry. Clean up any water that spills on the floor as soon as it happens. Remove soap buildup in the tub or shower regularly. Attach bath mats securely with double-sided non-slip rug tape. Do not have throw rugs and other things on the floor that can make you trip. What can I do in the bedroom? Use night lights. Make sure that you have a light by your bed that is easy to reach. Do not use any sheets or blankets that are too big for your bed. They should not hang down onto the floor. Have a firm chair that has side arms. You can use this for support while you get dressed. Do not have throw rugs and other things on the floor that can make you trip. What can I do in the kitchen? Clean up any spills right away. Avoid walking on wet floors. Keep items that you use a lot in easy-to-reach places. If you need to reach something above you, use a strong step stool that has a grab bar. Keep electrical cords out of the way. Do not use floor polish or wax that makes floors slippery. If you must use wax, use non-skid floor wax. Do not have throw rugs and other  things on the floor that can make you trip. What can I do with my stairs? Do not leave any items on the stairs. Make sure that there are handrails on both sides of the stairs and use them. Fix handrails that are broken or loose. Make sure that handrails are as long as the stairways. Check any carpeting to make sure that it is firmly attached to the stairs. Fix any carpet that is loose or worn. Avoid having throw rugs at the top or bottom of the stairs. If you do have throw rugs, attach them to the floor with carpet tape. Make sure that you have a light switch at the top of the stairs and the bottom of the stairs. If you do not have them, ask someone to add them for  you. What else can I do to help prevent falls? Wear shoes that: Do not have high heels. Have rubber bottoms. Are comfortable and fit you well. Are closed at the toe. Do not wear sandals. If you use a stepladder: Make sure that it is fully opened. Do not climb a closed stepladder. Make sure that both sides of the stepladder are locked into place. Ask someone to hold it for you, if possible. Clearly mark and make sure that you can see: Any grab bars or handrails. First and last steps. Where the edge of each step is. Use tools that help you move around (mobility aids) if they are needed. These include: Canes. Walkers. Scooters. Crutches. Turn on the lights when you go into a dark area. Replace any light bulbs as soon as they burn out. Set up your furniture so you have a clear path. Avoid moving your furniture around. If any of your floors are uneven, fix them. If there are any pets around you, be aware of where they are. Review your medicines with your doctor. Some medicines can make you feel dizzy. This can increase your chance of falling. Ask your doctor what other things that you can do to help prevent falls. This information is not intended to replace advice given to you by your health care provider. Make sure you discuss any questions you have with your health care provider. Document Released: 02/19/2009 Document Revised: 10/01/2015 Document Reviewed: 05/30/2014 Elsevier Interactive Patient Education  2017 [employer].

## 2021-06-02 NOTE — Addendum Note (Signed)
Addended by: De Hollingshead on: 06/02/2021 04:13 PM   Modules accepted: Orders

## 2021-06-02 NOTE — Chronic Care Management (AMB) (Signed)
Chronic Care Management CCM Pharmacy Note  06/02/2021 Name:  Susan [name] MRN:  [id number] DOB:  December 31, 1960  Summary: - Tolerating current regimen. BP still elevated.   Recommendations/Changes made from today's visit: - Discussed with PCP. Increase amlodipine to 10 mg daily, move to evening administration  Subjective: Susan [name] is an 61 y.o. year old female who is a primary patient of Flinchum, Kelby Aline, FNP.  The CCM team was consulted for assistance with disease management and care coordination needs.    Engaged with patient by telephone for follow up visit for pharmacy case management and/or care coordination services.   Objective:  Medications Reviewed Today     Reviewed by De Hollingshead, RPH-CPP (Pharmacist) on 06/02/21 at 1338  Med List Status: <None>   Medication Order Taking? Sig Documenting Provider Last Dose Status Informant  ACCU-CHEK GUIDE test strip [id number] Yes TEST UP TO FOUR TIMES A DAY Flinchum, Kelby Aline, FNP Taking Active   Accu-Chek Softclix Lancets lancets [id number] Yes TEST 4 TIMES A DAY Flinchum, Kelby Aline, FNP Taking Active   acetaminophen (TYLENOL) 325 MG tablet [id number] Yes Take 650 mg by mouth every 6 (six) hours as needed. [provider] Taking Active   albuterol (VENTOLIN HFA) 108 (90 Base) MCG/ACT inhaler [id number] No Inhale 2 puffs into the lungs every 4 (four) hours as needed for wheezing or shortness of breath.  Patient not taking: Reported on 06/02/2021   Delsa Grana, PA-C Not Taking Active   amLODipine (NORVASC) 5 MG tablet [id number] Yes Take 1 tablet (5 mg total) by mouth daily. Kate Sable, MD Taking Active   atorvastatin (LIPITOR) 10 MG tablet [id number] Yes TAKE 1 TABLET (10 MG TOTAL) BY MOUTH AT BEDTIME. Flinchum, Kelby Aline, FNP Taking Active   blood glucose meter kit and supplies [id number]  Dispense based on patient and insurance preference. Use up to four times daily as directed. (FOR ICD-10 E10.9,  E11.9). Flinchum, Kelby Aline, FNP  Active   carvedilol (COREG) 25 MG tablet [id number] Yes Take 1 tablet (25 mg total) by mouth 2 (two) times daily with a meal. Monitor heart rate if going below 60 need to notify the office, seek care if afterhours. Flinchum, Kelby Aline, FNP Taking Active   clindamycin (CLEOCIN T) 1 % external solution [id number] No Apply to aa's scalp QD PRN  Patient not taking: Reported on 06/02/2021   Ralene Bathe, MD Not Taking Active   clotrimazole (LOTRIMIN) 1 % cream [id number] No Apply 1 application topically 2 (two) times daily as needed.  Patient not taking: Reported on 06/02/2021   [provider] Not Taking Active   cyclobenzaprine (FLEXERIL) 5 MG tablet [id number] Yes Take 5 mg by mouth 2 (two) times daily as needed for muscle spasms.  [provider] Taking Active Self           Med Note Kelby Aline Jun 02, 2021  1:36 PM) Taking 10 mg QPM  DULoxetine (CYMBALTA) 60 MG capsule [id number] Yes TAKE 1 CAPSULE TWICE A Feliberto Harts, MD Taking Active            Med Note Darnelle Maffucci, Arville Lime   Wed Jun 02, 2021  1:34 PM) Taking once daily QAM  Fluticasone-Umeclidin-Vilant (TRELEGY ELLIPTA) 100-62.5-25 MCG/INH AEPB [id number] Yes Inhale 1 puff into the lungs daily. Delsa Grana, PA-C Taking Active Self  gabapentin (NEURONTIN) 300 MG capsule [id number] Yes Take 600 mg by mouth 2 (two) times daily.  [provider] Taking Active Self           Med Note Sharene Butters   Fri Apr 22, 2016 12:30 PM)    HYDROcodone-acetaminophen (NORCO/VICODIN) 5-325 MG tablet [id number] Yes Take 1 tablet by mouth every 4 (four) hours as needed. [provider] Taking Active   LATUDA 60 MG TABS [id number] Yes TAKE 1 TABLET BY MOUTH DAILY WITH Letitia Caul, MD Taking Active   meloxicam (MOBIC) 15 MG tablet [id number] Yes Take 15 mg by mouth daily. [provider] Taking Active   omeprazole (PRILOSEC) 20 MG capsule [id number] Yes  Take 1 capsule (20 mg total) by mouth daily as needed. Take 30 mins to 1 hr before breakfast Burnard Hawthorne, FNP Taking Active   Semaglutide, 1 MG/DOSE, 4 MG/3ML SOPN [id number] Yes Inject 1 mg as directed once a week. Flinchum, Kelby Aline, FNP Taking Active   spironolactone (ALDACTONE) 25 MG tablet [id number] Yes Take 1 tablet (25 mg total) by mouth daily. Flinchum, Kelby Aline, FNP Taking Active   telmisartan (MICARDIS) 80 MG tablet [id number] Yes Take 1 tablet (80 mg total) by mouth daily. Doreen Beam, FNP Taking Active             Pertinent Labs:   Lab Results  Component Value Date   HGBA1C 5.7 05/19/2021   Lab Results  Component Value Date   CHOL 151 02/10/2021   HDL 55.20 02/10/2021   LDLCALC 71 02/10/2021   TRIG 124.0 02/10/2021   CHOLHDL 3 02/10/2021   Lab Results  Component Value Date   CREATININE 0.90 05/19/2021   BUN 13 05/19/2021   NA 134 (L) 05/19/2021   K 3.9 05/19/2021   CL 100 05/19/2021   CO2 28 05/19/2021    SDOH:  (Social Determinants of Health) assessments and interventions performed:  SDOH Interventions    Flowsheet Row Most Recent Value  SDOH Interventions   Financial Strain Interventions Intervention Not Indicated       CCM Care Plan  Review of patient past medical history, allergies, medications, health status, including review of consultants reports, laboratory and other test data, was performed as part of comprehensive evaluation and provision of chronic care management services.   Care Plan : Medication Management  Updates made by De Hollingshead, RPH-CPP since 06/02/2021 12:00 AM     Problem: Hypertension, Tobacco Use, COPD      Long-Range Goal: Disease Progression Prevention   Start Date: 04/13/2021  Recent Progress: On track  Priority: High  Note:   Current Barriers:  Unable to achieve control of blood pressure   Pharmacist Clinical Goal(s):  patient will achieve control of blood pressure asthrough collaboration  with PharmD and provider.    Interventions: 1:1 collaboration with Flinchum, Kelby Aline, FNP regarding development and update of comprehensive plan of care as evidenced by provider attestation and co-signature Inter-disciplinary care team collaboration (see longitudinal plan of care) Comprehensive medication review performed; medication list updated in electronic medical record  Hypertension:   Uncontrolled; current treatment: valsartan 80 mg QAM, carvedilol 25 mg BID; spironolactone 25 mg daily QAM, amlodipine 5 mg QAM Current home readings: AM readings ~150s/90, readings later in the day ~130-140s/70-80 Current smoker.  Reviewed goal <130/80. Reviewed administration pattern. Recommend to increase amlodipine to 10 mg daily and move to QPM administration. Discussed with PCP, she is in agreement and patient is amenable. Counseled on risk of development of lower extremity edema. Follow up with PCP in 2 weeks as scheduled.  Hyperlipidemia:   Controlled; current treatment: atorvastatin 10 mg daily  Previously recommended to continue current regimen at this time  Chronic Obstructive Pulmonary Disease:   Uncontrolled;current treatment:Trelegy 100/62.5/25; albuterol HFA PRN Recommended to continue current regimen at this time  Bipolar Depression, pain:   Worsened anxiety due to blood pressure; current treatment: duloxetine 60 mg BID - reports she and psychiatrist discussed that SNRI may impact blood pressure, patient reduced to once daily administration. Denies any worsening of mood;  Latuda 60 mg daily, gabepentin 600 mg BID, meloxicam 15 mg daily, hydrocodone/acetaminophen 5/325 mg PRN Discussed impact of daily NSAID use + ARB on renal function. Advised to discuss daily use of higher dose meloxicam with ortho at next appointment next week. Consider reducing dose, frequency, or trialing a non-NSAID modality.  Recommended to continue current regimen    Patient Goals/Self-Care  Activities patient will:  - take medications as prescribed as evidenced by patient report and record review check blood pressure once daily, document, and provide at future appointments       Plan: Telephone follow up appointment with care management team member scheduled for:  4 weeks  [name] Darnelle Maffucci, PharmD, [place], [place] Pharmacist [employer] [facility] [employer] [phone]

## 2021-06-02 NOTE — Progress Notes (Signed)
Virtual Visit via Telephone Note  I connected with  Susan [name] on 06/02/21 at 10:20 AM EST by telephone and verified that I am speaking with the correct person using two identifiers.  Location: Patient: home  Provider: [facility] Persons participating in the virtual visit: Susan [name]   I discussed the limitations, risks, security and privacy concerns of performing an evaluation and management service by telephone and the availability of in person appointments. The patient expressed understanding and agreed to proceed.  Interactive audio and video telecommunications were attempted between this nurse and patient, however failed, due to patient having technical difficulties OR patient did not have access to video capability.  We continued and completed visit with audio only.  Some vital signs may be absent or patient reported.   Susan David, LPN  Subjective:   Susan [name] is a 61 y.o. female who presents for Medicare Annual (Subsequent) preventive examination.  Review of Systems           Objective:    Today's Vitals   06/02/21 1024  PainSc: 0-No pain   There is no height or weight on file to calculate BMI.  Advanced Directives 04/12/2021 02/16/2021 05/27/2020 05/22/2020 07/23/2019 07/16/2019 07/19/2018  Does Patient Have a Medical Advance Directive? _0  No No  Type of Advance Directive - - - - - - -  Does patient want to make changes to medical advance directive? - - No - Patient declined - - - -  Copy of [place] in Chart? - - - - - - -  Would patient like information on creating a medical advance directive? - No - Patient declined - - No - Patient declined No - Patient declined Yes (MAU/Ambulatory/Procedural Areas - Information given)    Current Medications (verified) Outpatient Encounter Medications as of 06/02/2021  Medication Sig   ACCU-CHEK GUIDE test strip TEST UP TO FOUR TIMES A DAY   Accu-Chek Softclix Lancets  lancets TEST 4 TIMES A DAY   albuterol (VENTOLIN HFA) 108 (90 Base) MCG/ACT inhaler Inhale 2 puffs into the lungs every 4 (four) hours as needed for wheezing or shortness of breath.   amLODipine (NORVASC) 5 MG tablet Take 1 tablet (5 mg total) by mouth daily.   atorvastatin (LIPITOR) 10 MG tablet TAKE 1 TABLET (10 MG TOTAL) BY MOUTH AT BEDTIME.   B-D UF III MINI PEN NEEDLES 31G X 5 MM MISC USE WITH OZEMPIC   blood glucose meter kit and supplies Dispense based on patient and insurance preference. Use up to four times daily as directed. (FOR ICD-10 E10.9, E11.9).   carvedilol (COREG) 25 MG tablet Take 1 tablet (25 mg total) by mouth 2 (two) times daily with a meal. Monitor heart rate if going below 60 need to notify the office, seek care if afterhours.   clindamycin (CLEOCIN T) 1 % external solution Apply to aa's scalp QD PRN   clotrimazole (LOTRIMIN) 1 % cream Apply 1 application topically 2 (two) times daily as needed.   cyclobenzaprine (FLEXERIL) 5 MG tablet Take 5 mg by mouth 2 (two) times daily as needed for muscle spasms.    DULoxetine (CYMBALTA) 60 MG capsule TAKE 1 CAPSULE TWICE A DAY   Fluticasone-Umeclidin-Vilant (TRELEGY ELLIPTA) 100-62.5-25 MCG/INH AEPB Inhale 1 puff into the lungs daily.   gabapentin (NEURONTIN) 300 MG capsule Take 600 mg by mouth 2 (two) times daily.    HYDROcodone-acetaminophen (NORCO/VICODIN) 5-325 MG tablet    Lancet  Devices (TRUEDRAW LANCING DEVICE) MISC    LATUDA 60 MG TABS TAKE 1 TABLET BY MOUTH DAILY WITH DINNER   meloxicam (MOBIC) 15 MG tablet Take by mouth.   Multiple Vitamin (MULTIVITAMIN) tablet Take 1 tablet by mouth daily.   omeprazole (PRILOSEC) 20 MG capsule Take 1 capsule (20 mg total) by mouth daily as needed. Take 30 mins to 1 hr before breakfast   Semaglutide, 1 MG/DOSE, 4 MG/3ML SOPN Inject 1 mg as directed once a week.   spironolactone (ALDACTONE) 25 MG tablet Take 1 tablet (25 mg total) by mouth daily.   telmisartan (MICARDIS) 80 MG tablet Take  1 tablet (80 mg total) by mouth daily.   No facility-administered encounter medications on file as of 06/02/2021.    Allergies (verified) Wellbutrin [bupropion] and Lasix [furosemide]   History: Past Medical History:  Diagnosis Date   ADD (attention deficit disorder)    Alcohol abuse    Anemia    Anxiety    Aortic atherosclerosis ([place]) 02/20/2018   Chest CT Sept 2019   Arthritis    rheumatoid arthritis   Asthma    Bipolar disorder ([place])    Centrilobular emphysema ([place]) 02/20/2018   Chest CT Sept 2019   Constipation    Degenerative disc disease at L5-S1 level 09/28/2016   See ortho note May 2018   Depression    bipolar, hx of suicide attempt   Drug use    Dyspnea    with exertion   GERD (gastroesophageal reflux disease)    H/O suicide attempt    slit wrists   Hepatitis C 06/26/2014   Hip pain    History of alcohol abuse    History of diverticulitis 2013   History of hepatitis C    HEP "C"--three years ago   History of MRSA infection 2013   HLD (hyperlipidemia)    Hypertension    Hypothyroidism    Incisional hernia 11/08/2012   Knee pain    MRSA carrier Nov 2013   Multinodular thyroid    OSA (obstructive sleep apnea)    managed by Dr. Manuella Ghazi   Osteoporosis    Post-traumatic osteoarthritis of right knee 09/24/2015   Prediabetes    Recurrent ventral hernia 11/08/2012   Sleep apnea     Dx 3 years ago. Use C-PAP   Status post total right knee replacement using cement 05/10/2016   Thyroid disease    Goiter   Vitamin B12 deficiency    Vitamin D deficiency disease    Past Surgical History:  Procedure Laterality Date   BILATERAL SALPINGOOPHORECTOMY     due to abnormal mass   BREAST BIOPSY Left    neg   BREAST SURGERY Left 20 yrs ago   CESAREAN SECTION     COLONOSCOPY     COLONOSCOPY WITH PROPOFOL N/A 10/16/2017   Procedure: COLONOSCOPY WITH PROPOFOL;  Surgeon: Jonathon Bellows, MD;  Location: [facility] ENDOSCOPY;  Service: Gastroenterology;  Laterality: N/A;   COLONOSCOPY WITH  PROPOFOL N/A 02/16/2021   Procedure: COLONOSCOPY WITH PROPOFOL;  Surgeon: Jonathon Bellows, MD;  Location: [facility] ENDOSCOPY;  Service: Gastroenterology;  Laterality: N/A;   HERNIA REPAIR  06/2011, July 2014   Ventral wall repair with Physiomesh   HERNIA REPAIR     2nd.vental wall repair   JOINT REPLACEMENT Right    knee   TONSILLECTOMY     TOTAL HIP ARTHROPLASTY Left 07/23/2019   Procedure: TOTAL HIP ARTHROPLASTY;  Surgeon: Corky Mull, MD;  Location: [facility] ORS;  Service: Orthopedics;  Laterality: Left;  ° TOTAL KNEE ARTHROPLASTY Right 05/10/2016  ° Procedure: TOTAL KNEE ARTHROPLASTY;  Surgeon: John J Poggi, MD;  Location: [facility] ORS;  Service: Orthopedics;  Laterality: Right;  ° TUBAL LIGATION    ° °Family History  °Problem Relation Age of Onset  ° Arthritis Mother   ° Asthma Mother   ° Mental illness Mother   ° Thyroid disease Mother   ° COPD Mother   ° Heart disease Mother   ° Congestive Heart Failure Mother   ° Alcohol abuse Mother   ° Eating disorder Mother   ° Bipolar disorder Mother   ° Alcohol abuse Father   ° Heart attack Father   ° Alcohol abuse Sister   ° Drug abuse Sister   ° Mental illness Sister   ° Mental illness Sister   ° Fibromyalgia Sister   ° Obesity Sister   ° Pneumonia Sister   ° Depression Sister   ° Mental illness Sister   ° Alcohol abuse Sister   ° Drug abuse Sister   ° Arthritis Brother   ° Mental illness Brother   ° Cancer Brother   °     non-hodkins lymphoma  ° Drug abuse Daughter   ° Drug abuse Son   ° Alcohol abuse Son   ° Drug abuse Son   ° Alcohol abuse Son   ° Diabetes Neg Hx   ° Stroke Neg Hx   ° Breast cancer Neg Hx   ° Thyroid cancer Neg Hx   ° °Social History  ° °Socioeconomic History  ° Marital status: Widowed  °  Spouse name: Not on file  ° Number of children: 3  ° Years of education: Not on file  ° Highest education level: Some college, no degree  °Occupational History  ° Occupation: disabled  °Tobacco Use  ° Smoking status: Every Day  °  Packs/day: 0.75  °  Years: 41.00  °   Pack years: 30.75  °  Types: Cigarettes  ° Smokeless tobacco: Never  °Vaping Use  ° Vaping Use: Former  ° Quit date: 07/15/2017  °Substance and Sexual Activity  ° Alcohol use: No  °  Alcohol/week: 0.0 standard drinks  °  Comment: sober since October 2018  ° Drug use: No  °  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  ° Sexual activity: Not Currently  °Other Topics Concern  ° Not on file  °Social History Narrative  ° Not on file  ° °Social Determinants of Health  ° °Financial Resource Strain: Low Risk   ° Difficulty of Paying Living Expenses: Not very hard  °Food Insecurity: Not on file  °Transportation Needs: Not on file  °Physical Activity: Not on file  °Stress: Not on file  °Social Connections: Not on file  ° ° °Tobacco Counseling °Ready to quit: Not Answered °Counseling given: Not Answered ° ° °Clinical Intake: ° °Pre-visit preparation completed: Yes ° °Pain : No/denies pain °Pain Score: 0-No pain ° °  ° °Nutritional Risks: None °Diabetes: No ° °How often do you need to have someone help you when you read instructions, pamphlets, or other written materials from your doctor or pharmacy?: 1 - Never ° °Diabetic?patient says no longer ° °Interpreter Needed?: No ° °Information entered by ::  , LPN ° ° °Activities of Daily Living °No flowsheet data found. ° °Patient Care Team: °Flinchum, Michelle S, FNP as PCP - General (Family Medicine) °Agbor-Etang, Brian, MD as PCP - Cardiology (Cardiology) °Chasnis, Benjamin, MD as Referring   Physician (Physical Medicine and Rehabilitation) Vladimir Crofts, MD (Neurology) Gabriel Carina Betsey Holiday, MD as Consulting Physician (Endocrinology) Birder Robson, MD as Referring Physician (Ophthalmology) Mikle Bosworth Margarita Sermons, PA-C as Physician Assistant (Physician Assistant) Poggi, Marshall Cork, MD as Consulting Physician (Orthopedic Surgery) Sharlotte Alamo, DPM (Podiatry) Ursula Alert, MD as Consulting Physician (Psychiatry) Buena Irish, LCSW as Social Worker  (Psychology) Ralene Bathe, MD (Dermatology)  Indicate any recent Medical Services you may have received from other than [facility] providers in the past year (date may be approximate).     Assessment:   This is a routine wellness examination for [place].  Hearing/Vision screen No results found.  Dietary issues and exercise activities discussed:     Goals Addressed   None    Depression Screen PHQ 2/9 Scores 05/19/2021 04/28/2021 05/28/2020 05/27/2020 11/06/2019 10/16/2019 08/21/2019  PHQ - 2 Score _0 PHQ- 9 Score - - 6 - _1 Some encounter information is confidential and restricted. Go to Review Flowsheets activity to see all data.    Fall Risk Fall Risk  05/19/2021 04/28/2021 05/27/2020 11/06/2019 10/16/2019  Falls in the past year? 1 1 0 0 0  Number falls in past yr: 0 0 0 0 0  Injury with Fall? 0 0 0 0 0  Risk for fall due to : - - - - -  Risk for fall due to: Comment - - - - -  Follow up Falls evaluation completed Falls evaluation completed - Falls evaluation completed -    FALL RISK PREVENTION PERTAINING TO THE HOME:  Any stairs in or around the home? Yes  If so, are there any without handrails? No  Home free of loose throw rugs in walkways, pet beds, electrical cords, etc? Yes  Adequate lighting in your home to reduce risk of falls? Yes   ASSISTIVE DEVICES UTILIZED TO PREVENT FALLS:  Life alert? No  Use of a cane, walker or w/c? Yes  Grab bars in the bathroom? Yes  Shower chair or bench in shower? Yes  Elevated toilet seat or a handicapped toilet? Yes     Cognitive Function:Normal cognitive status assessed by direct observation by this Nurse Health Advisor. No abnormalities found.       6CIT Screen 01/04/2019 07/19/2018  What Year? 0 points 0 points  What month? 0 points 0 points  What time? 0 points 0 points  Count back from 20 0 points 0 points  Months in reverse 0 points 0 points  Repeat phrase 0 points 0 points  Total Score 0 0     Immunizations Immunization History  Administered Date(s) Administered   Influenza Inj Mdck Quad Pf 02/13/2019   Influenza,inj,Quad PF,6+ Mos 04/09/2015, 01/10/2018, 04/08/2019, 01/27/2020, 02/03/2021   Influenza-Unspecified 05/26/2014, 03/30/2016   PFIZER(Purple Top)SARS-COV-2 Vaccination 08/31/2019, 09/24/2019   PNEUMOCOCCAL CONJUGATE-20 03/24/2021   Pneumococcal Polysaccharide-23 04/09/2015   Tdap 03/09/2012    TDAP status: Up to date  Flu Vaccine status: Up to date  Pneumococcal vaccine status: Up to date  Covid-19 vaccine status: Completed vaccines  Qualifies for Shingles Vaccine? Yes   Zostavax completed No   Shingrix Completed?: No.    Education has been provided regarding the importance of this vaccine. Patient has been advised to call insurance company to determine out of pocket expense if they have not yet received this vaccine. Advised may also receive vaccine at local pharmacy or Health Dept. Verbalized acceptance and understanding.  Screening Tests Health Maintenance  Topic  Date Due  ° MAMMOGRAM  03/16/2018  ° COVID-19 Vaccine (3 - Booster for Pfizer series) 11/19/2019  ° Zoster Vaccines- Shingrix (1 of 2) 07/27/2021 (Originally 01/25/2011)  ° COLONOSCOPY (Pts 45-49yrs Insurance coverage will need to be confirmed)  02/16/2022  ° TETANUS/TDAP  03/09/2022  ° PAP SMEAR-Modifier  03/24/2022  ° INFLUENZA VACCINE  Completed  ° Hepatitis C Screening  Completed  ° HIV Screening  Completed  ° HPV VACCINES  Aged Out  ° ° °Health Maintenance ° °Health Maintenance Due  °Topic Date Due  ° MAMMOGRAM  03/16/2018  ° COVID-19 Vaccine (3 - Booster for Pfizer series) 11/19/2019  ° ° °Colorectal cancer screening: Type of screening: Colonoscopy. Completed 02/16/21. Repeat every 10 years ° °Mammogram status: Ordered has appointment next week. Pt provided with contact info and advised to call to schedule appt.  ° ° °Lung Cancer Screening: (Low Dose CT Chest recommended if Age 55-80 years, 30  pack-year currently smoking OR have quit w/in 15years.) does not qualify.  ° ° °Additional Screening: ° °Hepatitis C Screening: does qualify; Completed 05/19/21 ° °Vision Screening: Recommended annual ophthalmology exams for early detection of glaucoma and other disorders of the eye. °Is the patient up to date with their annual eye exam?  Yes  °Who is the provider or what is the name of the office in which the patient attends annual eye exams? [facility] °If pt is not established with a provider, would they like to be referred to a provider to establish care? No .  ° °Dental Screening: Recommended annual dental exams for proper oral hygiene ° °Community Resource Referral / Chronic Care Management: °CRR required this visit?  No  ° °CCM required this visit?  No  ° ° °  °Plan:  °  ° °I have personally reviewed and noted the following in the patient’s chart:  ° °Medical and social history °Use of alcohol, tobacco or illicit drugs  °Current medications and supplements including opioid prescriptions.  °Functional ability and status °Nutritional status °Physical activity °Advanced directives °List of other physicians °Hospitalizations, surgeries, and ER visits in previous 12 months °Vitals °Screenings to include cognitive, depression, and falls °Referrals and appointments ° °In addition, I have reviewed and discussed with patient certain preventive protocols, quality metrics, and best practice recommendations. A written personalized care plan for preventive services as well as general preventive health recommendations were provided to patient. °  ° ° ° S , LPN   06/02/2021  ° °Nurse Notes: none ° ° ° ° ° °

## 2021-06-03 ENCOUNTER — Other Ambulatory Visit: Payer: Self-pay | Admitting: Adult Health

## 2021-06-03 ENCOUNTER — Other Ambulatory Visit: Payer: Self-pay | Admitting: Family Medicine

## 2021-06-03 DIAGNOSIS — E119 Type 2 diabetes mellitus without complications: Secondary | ICD-10-CM

## 2021-06-03 MED ORDER — ACCU-CHEK SOFTCLIX LANCETS MISC: 1 refills | Status: DC

## 2021-06-03 MED ORDER — ACCU-CHEK GUIDE VI STRP: ORAL_STRIP | 0 refills | Status: DC

## 2021-06-08 ENCOUNTER — Ambulatory Visit (INDEPENDENT_AMBULATORY_CARE_PROVIDER_SITE_OTHER): Payer: Medicare Other | Admitting: Psychology

## 2021-06-08 DIAGNOSIS — F3176 Bipolar disorder, in full remission, most recent episode depressed: Secondary | ICD-10-CM

## 2021-06-08 DIAGNOSIS — E785 Hyperlipidemia, unspecified: Secondary | ICD-10-CM | POA: Diagnosis not present

## 2021-06-08 DIAGNOSIS — I10 Essential (primary) hypertension: Secondary | ICD-10-CM

## 2021-06-08 DIAGNOSIS — E119 Type 2 diabetes mellitus without complications: Secondary | ICD-10-CM

## 2021-06-08 DIAGNOSIS — F4310 Post-traumatic stress disorder, unspecified: Secondary | ICD-10-CM

## 2021-06-08 NOTE — Progress Notes (Signed)
[facility] Counselor/Therapist Progress Note  Patient ID: Susan [name], MRN: [id number]   Date: 06/08/21  Time Spent: 10:03  am -  10:44 am : 41 Minutes  Treatment Type: Individual Therapy.  Reported Symptoms: Anxiety and interpersonal stressors.    Mental Status Exam: Appearance:  NA     Behavior: Appropriate  Motor: Normal  Speech/Language:  Clear and Coherent  Affect: Congruent  Mood: normal  Thought process: normal  Thought content:   WNL  Sensory/Perceptual disturbances:   WNL  Orientation: oriented to person, place, time/date, and situation  Attention: Good  Concentration: Good  Memory: WNL  Fund of knowledge:  Good  Insight:   Good  Judgment:  Good  Impulse Control: Good   Risk Assessment: Danger to Self:  No Self-injurious Behavior: This Danger to Others: No Duty to Warn:no Physical Aggression / Violence:No  Access to Firearms a concern: No  Gang Involvement:No   Subjective:   Susan [name] participated from home, via phone and consented to treatment. Therapist participated from office. We met online due to [place] pandemic. Susan [name] noted a medication change, a reduction in her Cymbalta, due to increased hypertension. Susan [name] noted feeling better due to more acceptance. Susan [name] noted continued interpersonal stressors with her sister and daughter due to poor boundaries with time and her belongings, respectively. We continued to explore these stressors, during the session, and her attempts to manage her frustration regarding their behavior. Susan [name] noted working on becoming more social, as she noted a recent history of seclusion, and has begun putting effort into space. Susan [name] noted a need to work on maintaining her boundaries and being mindful of her overall needs.  We worked identifying ways to become more mindful of her own needs, take inventory of steps needed to maintain overall mood.  We discussed the idea of identifying behaviors, tools,  protocols, to help her maintain healthful and positive mood.  She noted this is a part of the 12-step program, a daily inventory, which she has not completed in some time.  She noted her commitment towards this goal between sessions.  Therapist encouraged continued mindfulness and self-care.  A follow-up was scheduled. Therapist praised Susan [name] for her effort and energy between and during sessions and provided supportive therapy.  Interventions: Mindfulness Meditation and Interpersonal  Diagnosis: Bipolar disorder, in full remission, most recent episode depressed ([place])  PTSD (post-traumatic stress disorder)   Plan: Patient is to use CBT, BA, Problem Solving, Solution Focused, mindfulness,  and coping skills to help manage decrease symptoms associated with their diagnosis.   Related Problem: Alleviate depressive symptoms and return to previous level of effective functioning. Description: Learn and implement problem-solving and decision-making skills. Target Date: 2022-04-19 Progress: 0%  Related Problem: Appropriately grieve the loss in order to normalize mood and to return to previously adaptive level of functioning. Description: Use mindfulness and acceptance strategies to reduce experiential and cognitive avoidance and increase value-based behavior. Target Date: 2022-04-19 Progress: 0%  Related Problem: Appropriately grieve the loss in order to normalize mood and to return to previously adaptive level of functioning. Description: Increasingly verbalize hopeful and positive statements regarding self, others, and the future. Target Date: 2022-04-19 Progress: 20%  Related Problem: Appropriately grieve the loss in order to normalize mood and to return to previously adaptive level of functioning. Description: Describe current and past experiences with depression including their impact on functioning and attempts to resolve it. Target Date: 2022-04-19 Progress: 10%  Related Problem:  Appropriately grieve the loss in  order to normalize mood and to return to previously adaptive level of functioning. Description: Learn and implement conflict resolution skills to resolve interpersonal problems. Target Date: 2022-04-19 Progress: 30%  Related Problem: Appropriately grieve the loss in order to normalize mood and to return to previously adaptive level of functioning. Description: Verbalize an understanding and resolution of current interpersonal problems. Target Date: 2022-04-19  Progress: 20%  Related Problem: Appropriately grieve the loss in order to normalize mood and to return to previously adaptive level of functioning. Description: Implement mindfulness techniques for relapse prevention. Target Date: 2022-04-19 Progress: 10%  Related Problem: Learn and implement coping skills that result in a reduction of anxiety and worry, and improved daily functioning. Description: Reestablish a consistent sleep-wake cycle. Target Date: 2022-04-19 Progress: 10%  Related Problem: Learn and implement coping skills that result in a reduction of anxiety and worry, and improved daily functioning. Description: Learn to accept limitations in life and commit to tolerating, rather than avoiding, unpleasant emotions while accomplishing meaningful goals. Target Date: 2022-04-19 Progress: 10%  Related Problem: Learn and implement coping skills that result in a reduction of anxiety and worry, and improved daily functioning. Description: Learn and implement problem-solving strategies for realistically addressing worries. Target Date: 2022-04-19 Progress: 10%  Related Problem: Learn and implement coping skills that result in a reduction of anxiety and worry, and improved daily functioning. Description: Identify, challenge, and replace biased, fearful self-talk with positive, realistic, and empowering self-talk. Target Date: 2022-04-19 Progress: 20%  Related Problem: Learn and implement  coping skills that result in a reduction of anxiety and worry, and improved daily functioning. Description: Learn and implement a strategy to limit the association between various environmental settings and worry, delaying the worry until a designated "worry time." Target Date: 2022-04-19 Progress: 0%  Related Problem: Learn and implement coping skills that result in a reduction of anxiety and worry, and improved daily functioning. Description: Identify and engage in pleasant activities on a daily basis. Target Date: 2022-04-19 Progress: 30%  Related Problem: Learn and implement coping skills that result in a reduction of anxiety and worry, and improved daily functioning. Description: Identify the major life conflicts from the past and present that form the basis for present anxiety. Target Date: 2022-04-19 Progress: 20%  Related Problem: Learn and implement coping skills that result in a reduction of anxiety and worry, and improved daily functioning. Description: Cooperate with a medication evaluation by a physician. Target Date: 2022-04-19 Progress: 100%  Buena Irish, LCSW             Buena Irish, [place]

## 2021-06-09 ENCOUNTER — Ambulatory Visit
Admission: RE | Admit: 2021-06-09 | Discharge: 2021-06-09 | Disposition: A | Discharge status: Home / Self Care | Payer: Medicare Other | Source: Ambulatory Visit | Attending: Family | Admitting: Family

## 2021-06-09 ENCOUNTER — Other Ambulatory Visit: Payer: Self-pay | Admitting: Family

## 2021-06-09 ENCOUNTER — Other Ambulatory Visit: Payer: Self-pay

## 2021-06-09 DIAGNOSIS — Z13820 Encounter for screening for osteoporosis: Secondary | ICD-10-CM | POA: Diagnosis not present

## 2021-06-09 DIAGNOSIS — Z780 Asymptomatic menopausal state: Secondary | ICD-10-CM | POA: Diagnosis not present

## 2021-06-09 DIAGNOSIS — Z1231 Encounter for screening mammogram for malignant neoplasm of breast: Secondary | ICD-10-CM | POA: Insufficient documentation

## 2021-06-09 DIAGNOSIS — R928 Other abnormal and inconclusive findings on diagnostic imaging of breast: Secondary | ICD-10-CM

## 2021-06-09 DIAGNOSIS — M85851 Other specified disorders of bone density and structure, right thigh: Secondary | ICD-10-CM | POA: Insufficient documentation

## 2021-06-09 DIAGNOSIS — R2232 Localized swelling, mass and lump, left upper limb: Secondary | ICD-10-CM

## 2021-06-11 ENCOUNTER — Telehealth: Payer: Self-pay | Admitting: Adult Health

## 2021-06-11 ENCOUNTER — Other Ambulatory Visit: Payer: Self-pay

## 2021-06-11 ENCOUNTER — Ambulatory Visit
Admission: RE | Admit: 2021-06-11 | Discharge: 2021-06-11 | Disposition: A | Discharge status: Home / Self Care | Payer: Medicare Other | Source: Ambulatory Visit | Attending: Adult Health | Admitting: Adult Health

## 2021-06-11 ENCOUNTER — Other Ambulatory Visit: Payer: Self-pay | Admitting: Adult Health

## 2021-06-11 DIAGNOSIS — Z6841 Body Mass Index (BMI) 40.0 and over, adult: Secondary | ICD-10-CM | POA: Diagnosis not present

## 2021-06-11 DIAGNOSIS — R7989 Other specified abnormal findings of blood chemistry: Secondary | ICD-10-CM | POA: Insufficient documentation

## 2021-06-11 DIAGNOSIS — N281 Cyst of kidney, acquired: Secondary | ICD-10-CM

## 2021-06-11 DIAGNOSIS — I10 Essential (primary) hypertension: Secondary | ICD-10-CM | POA: Diagnosis present

## 2021-06-11 DIAGNOSIS — I701 Atherosclerosis of renal artery: Secondary | ICD-10-CM

## 2021-06-11 NOTE — Progress Notes (Signed)
Negative DVT ultrasound in lower extremities. See other note.

## 2021-06-11 NOTE — Telephone Encounter (Signed)
I have placed a order for split night sleep study in your quick sign folder please signe give to CMA to fax has demographics insurance and office note attached.

## 2021-06-12 NOTE — Progress Notes (Signed)
Renal artery ultrasound does show that she has elevated pressures in her right mid at the origin of her left renal artery that is very suspicions for renal artery stenosis( hardening) . Incidental left renal cyst.   Will order CT angiogram of abdomen and pelvis order placed  for further evaluation and I will need to refer her to a vascular surgeon and nephrology, this can be the cause of her recently increased blood pressure. She should be called within 2 weeks and if not please call us.

## 2021-06-12 NOTE — Progress Notes (Signed)
Orders Placed This Encounter  Procedures   CT Angio Abd/Pel w/ and/or w/o    Order Specific Question:   If indicated for the ordered procedure, I authorize the administration of contrast media per Radiology protocol    Answer:   Yes    Order Specific Question:   Is patient pregnant?    Answer:   No    Order Specific Question:   Preferred imaging location?    Answer:   [facility]    Order Specific Question:   Call Results- Best Contact Number?    Answer:   [phone]   Ambulatory referral to Nephrology    Referral Priority:   Routine    Referral Type:   Consultation    Referral Reason:   Specialty Services Required    Referred to Provider:   [facility]    Requested Specialty:   Nephrology    Number of Visits Requested:   1   Ambulatory referral to Vascular Surgery    Referral Priority:   Routine    Referral Type:   Surgical    Referral Reason:   Specialty Services Required    Requested Specialty:   Vascular Surgery    Number of Visits Requested:   1

## 2021-06-14 ENCOUNTER — Telehealth: Payer: Self-pay | Admitting: Adult Health

## 2021-06-14 NOTE — Telephone Encounter (Signed)
Lft pt vm to call ofc to sch CT ab and pelvis . thanks

## 2021-06-15 ENCOUNTER — Other Ambulatory Visit: Payer: Self-pay

## 2021-06-15 ENCOUNTER — Other Ambulatory Visit: Payer: Self-pay | Admitting: Adult Health

## 2021-06-15 ENCOUNTER — Ambulatory Visit
Admission: RE | Admit: 2021-06-15 | Discharge: 2021-06-15 | Disposition: A | Discharge status: Home / Self Care | Payer: Medicare Other | Source: Ambulatory Visit | Attending: Adult Health | Admitting: Adult Health

## 2021-06-15 DIAGNOSIS — I701 Atherosclerosis of renal artery: Secondary | ICD-10-CM | POA: Insufficient documentation

## 2021-06-15 DIAGNOSIS — Z8639 Personal history of other endocrine, nutritional and metabolic disease: Secondary | ICD-10-CM

## 2021-06-15 DIAGNOSIS — I10 Essential (primary) hypertension: Secondary | ICD-10-CM | POA: Insufficient documentation

## 2021-06-15 DIAGNOSIS — N281 Cyst of kidney, acquired: Secondary | ICD-10-CM | POA: Insufficient documentation

## 2021-06-15 DIAGNOSIS — E119 Type 2 diabetes mellitus without complications: Secondary | ICD-10-CM

## 2021-06-15 DIAGNOSIS — E1169 Type 2 diabetes mellitus with other specified complication: Secondary | ICD-10-CM

## 2021-06-15 DIAGNOSIS — R93429 Abnormal radiologic findings on diagnostic imaging of unspecified kidney: Secondary | ICD-10-CM

## 2021-06-15 MED ORDER — IOHEXOL 350 MG/ML SOLN
100.0000 mL | Freq: Once | INTRAVENOUS | Status: AC | PRN
  Administered 2021-06-15: 100 mL via INTRAVENOUS

## 2021-06-15 NOTE — Progress Notes (Signed)
No renal stenosis seen on CTA. Right renal cyst versus mass, US showed likely cyst, will have her keep follow up with nephrology and see urology.  Abdominal wall hernia containing hernia, umbilical hernia as well noted.  Colonic diverticular disease, continue to follow with Dr. Bailey Mech gastroenterology.   Vascular findings noted, no renal stenosis noted as on Ultrasound however does have some anatomical variations, will have vascular consult, she has already been referred to [facility].

## 2021-06-15 NOTE — Progress Notes (Signed)
Orders Placed This Encounter  Procedures  . Ambulatory referral to Endocrinology    Referral Priority:   Routine    Referral Type:   Consultation    Referral Reason:   Specialty Services Required    Number of Visits Requested:   1    

## 2021-06-16 ENCOUNTER — Ambulatory Visit: Payer: Medicare Other | Admitting: Adult Health

## 2021-06-16 DIAGNOSIS — E661 Drug-induced obesity: Secondary | ICD-10-CM | POA: Insufficient documentation

## 2021-06-16 DIAGNOSIS — E119 Type 2 diabetes mellitus without complications: Secondary | ICD-10-CM | POA: Insufficient documentation

## 2021-06-16 DIAGNOSIS — Z6838 Body mass index (BMI) 38.0-38.9, adult: Secondary | ICD-10-CM | POA: Insufficient documentation

## 2021-06-16 DIAGNOSIS — E1121 Type 2 diabetes mellitus with diabetic nephropathy: Secondary | ICD-10-CM | POA: Insufficient documentation

## 2021-06-22 ENCOUNTER — Ambulatory Visit: Payer: Medicare Other | Admitting: Adult Health

## 2021-06-22 ENCOUNTER — Other Ambulatory Visit: Payer: Medicare Other

## 2021-06-23 ENCOUNTER — Telehealth: Payer: Self-pay

## 2021-06-23 NOTE — Telephone Encounter (Signed)
Called and LVM in regards to upcoming appt. Would like for patient to bring SD card to cpap.

## 2021-06-24 ENCOUNTER — Telehealth: Payer: Self-pay | Admitting: Pharmacy Technician

## 2021-06-24 ENCOUNTER — Other Ambulatory Visit (HOSPITAL_COMMUNITY): Payer: Self-pay

## 2021-06-24 ENCOUNTER — Other Ambulatory Visit: Payer: Self-pay

## 2021-06-24 ENCOUNTER — Encounter: Payer: Self-pay | Admitting: Pulmonary Disease

## 2021-06-24 ENCOUNTER — Ambulatory Visit (INDEPENDENT_AMBULATORY_CARE_PROVIDER_SITE_OTHER): Payer: Medicare Other | Admitting: Pulmonary Disease

## 2021-06-24 VITALS — BP 132/68 | HR 68 | Temp 98.1°F | Ht 62.0 in | Wt 210.6 lb

## 2021-06-24 DIAGNOSIS — R0609 Other forms of dyspnea: Secondary | ICD-10-CM

## 2021-06-24 DIAGNOSIS — J432 Centrilobular emphysema: Secondary | ICD-10-CM

## 2021-06-24 DIAGNOSIS — G4733 Obstructive sleep apnea (adult) (pediatric): Secondary | ICD-10-CM

## 2021-06-24 MED ORDER — ALBUTEROL SULFATE HFA 108 (90 BASE) MCG/ACT IN AERS: 2.0000 | INHALATION_SPRAY | Freq: Four times a day (QID) | RESPIRATORY_TRACT | 5 refills | Status: DC | PRN

## 2021-06-24 MED ORDER — ALBUTEROL SULFATE HFA 108 (90 BASE) MCG/ACT IN AERS: 2.0000 | INHALATION_SPRAY | RESPIRATORY_TRACT | 2 refills | Status: DC | PRN

## 2021-06-24 NOTE — Progress Notes (Signed)
[facility]  Chief Complaint  Patient presents with   sleep consult    Wearing cpap avg 6-7hr nightly- pressure is not strong enough.     Past Surgical History:  She  has a past surgical history that includes Breast surgery (Left, 20 yrs ago); Cesarean section; Bilateral salpingoophorectomy; Tonsillectomy; Tubal ligation; Total knee arthroplasty (Right, 05/10/2016); Breast biopsy (Left); Colonoscopy; Colonoscopy with propofol (N/A, 10/16/2017); Joint replacement (Right); Hernia repair (06/2011, July 2014); Hernia repair; Total hip arthroplasty (Left, 07/23/2019); and Colonoscopy with propofol (N/A, 02/16/2021).  Past Medical History:  ADD, ETOH, Anemia, Anxiety, RA, Bipolar, DJD, Depression, GERD, Suicidal ideation, Hep C, Diverticulitis, MRSA, HLD, HTN, Hypothyroidism, Osteoporosis, PTSD, Pre DM, Vit B12 deficiency, Vit D deficiency, COVID 24 February 2021  Constitutional:  BP 132/68 (BP Location: Left Arm, Cuff Size: Large)    Pulse 68    Temp 98.1 F (36.7 C) (Temporal)    Ht '5\' 2"'  (1.575 m)    Wt 210 lb 9.6 oz (95.5 kg)    SpO2 97%    BMI 38.52 kg/m   Brief Summary:  Susan [name] is a 61 y.o. female smoker with obstructive sleep apnea and dyspnea.      Subjective:   She had a sleep study on 2016 that showed severe sleep apnea.  She has been using CPAP since.  She uses [employer] for her DME.  She got a new machine about a year ago.  She cut her hair shorter, and not her mask isn't fitting properly.  She goes to bed at 8 pm.  She falls asleep after 20 to 30 minutes.  She wakes up 2 times to use the bathroom.  She gets out of bed at 6 am.  She feels fatigued during the day.  This has been more of a problem since she had COVID infection in October 2022.  She denies sleep walking, sleep talking, bruxism, or nightmares.  There is no history of restless legs.  She denies sleep hallucinations, sleep paralysis, or cataplexy.  The Epworth score is 8  out of 24.  She quit drinking alcohol several years ago.  It has been more of a struggle to quit smoking cigarettes.    CT chest from December 2022 was normal.  PFT from October 2020 was normal.  She has used trelegy before, but this didn't seem to help.  She uses albuterol intermittently with some improvement - mostly during the Summer months when she has allergies.    She has been feeling more winded with activity since she had COVID.  She can walk about 150 feet before she has to rest.  She recovers in a few minutes after resting.  She is not having cough, wheeze, sputum, or chest pain.  She was walked on room air in office today.  She was able to walk 2 laps before asking to stop because she felt short of breath.  Her SpO2 low was 97% and HR went from 65 to 99.   Physical Exam:   Appearance - well kempt   ENMT - no sinus tenderness, no oral exudate, no LAN, Mallampati 3 airway, no stridor  Respiratory - equal breath sounds bilaterally, no wheezing or rales  CV - s1s2 regular rate and rhythm, no murmurs  Ext - no clubbing, no edema  Skin - no rashes  Psych - normal mood and affect   Pulmonary testing:  PFT 02/14/19 >> FEV1 2.43 (96%), FEV1% 77, TLC 5.18 (103%),  DLCO 80%  Chest Imaging:  CT angio chest 04/30/21 >> lungs clear  Sleep Tests:  PSG 11/04/14 >> AHI 72.9, SpO2 low 80.8% CPAP 12/04/14 >> CPAP 14 cm H2O CPAP 05/25/21 to 06/23/21 >> used on 29 of 30 nights with average 8 hrs 50 min.  Average AHI 0.4 with CPAP 14 cm H2O  Cardiac Tests:  Echo 01/21/19 >> EF greater than 65%  Social History:  She  reports that she has been smoking cigarettes. She has a 61.50 pack-year smoking history. She has never used smokeless tobacco. She reports that she does not drink alcohol and does not use drugs.  Family History:  Her family history includes Alcohol abuse in her father, mother, sister, sister, son, and son; Arthritis in her brother and mother; Asthma in her mother; Bipolar  disorder in her mother; COPD in her mother; Cancer in her brother; Congestive Heart Failure in her mother; Depression in her sister; Drug abuse in her daughter, sister, sister, son, and son; Eating disorder in her mother; Fibromyalgia in her sister; Heart attack in her father; Heart disease in her mother; Mental illness in her brother, mother, sister, sister, and sister; Obesity in her sister; Pneumonia in her sister; Thyroid disease in her mother.    Discussion:  She has history of severe obstructive sleep apnea.  She is compliant with therapy and received a new CPAP device in 2022.  Her main issue with set up relates to mask fit.  She had continued tobacco abuse.  She had COVID 19 infection in October 2023.  She has persistent dyspnea on exertion.  Her previous PFT was normal and most recent CT chest was normal.  She had previous trial of trelegy, but this didn't help.  She reports benefit from as needed albuterol therapy when she has allergy symptoms during the Summer months.  She could have intermittent asthma.  I suspect she also has deconditioning in setting of obesity.  Assessment/Plan:   Obstructive sleep apnea. - she is compliant with CPAP and reports benefit from therapy - she uses [employer] for her DME - continue CPAP 14 cm H2O - will arrange for mask refitting and overnight oximetry on CPAP  Dyspnea on exertion. - likely from deconditioning and obesity - encouraged her to start a weight loss regimen and exercise regimen - will arrange for repeat PFT to assess whether she has developed COPD - prn albuterol for mild, intermittent aceinllergic asthma  Tobacco abuse. - discussed options to assist with smoking cessation - she will try nicotine patch    Time Spent Involved in Patient Care on Day of Examination:  54 minutes  Follow up:   Patient Instructions  Will arrange for pulmonary function test and overnight oxygen test while using CPAP  Will have [employer] refit your CPAP  mask  Albuterol two puffs every 6 hours as needed for cough, wheeze, chest congestion, or shortness of breath  Follow up in 6 weeks with Dr. Halford Chessman or Nurse Practitioner  Medication List:   Allergies as of 06/24/2021       Reactions   Wellbutrin [bupropion]    Patient reports made " deathly sick " years ago at appointment 03/18/20.   Lasix [furosemide] Other (See Comments)   Electrolyte imbalance        Medication List        Accurate as of June 24, 2021  9:42 AM. If you have any questions, ask your nurse or doctor.  STOP taking these medications    Trelegy Ellipta 100-62.5-25 MCG/ACT Aepb Generic drug: Fluticasone-Umeclidin-Vilant Stopped by: Chesley Mires, MD       TAKE these medications    Accu-Chek Guide test strip Generic drug: glucose blood TEST UP TO FOUR TIMES A DAY   Accu-Chek Softclix Lancets lancets TEST 4 TIMES A DAY   acetaminophen 325 MG tablet Commonly known as: TYLENOL Take 650 mg by mouth every 6 (six) hours as needed.   albuterol 108 (90 Base) MCG/ACT inhaler Commonly known as: VENTOLIN HFA Inhale 2 puffs into the lungs every 4 (four) hours as needed for wheezing or shortness of breath.   amLODipine 10 MG tablet Commonly known as: NORVASC Take 1 tablet (10 mg total) by mouth daily.   atorvastatin 10 MG tablet Commonly known as: LIPITOR TAKE 1 TABLET (10 MG TOTAL) BY MOUTH AT BEDTIME.   blood glucose meter kit and supplies Dispense based on patient and insurance preference. Use up to four times daily as directed. (FOR ICD-10 E10.9, E11.9).   carvedilol 25 MG tablet Commonly known as: COREG Take 1 tablet (25 mg total) by mouth 2 (two) times daily with a meal. Monitor heart rate if going below 60 need to notify the office, seek care if afterhours.   clindamycin 1 % external solution Commonly known as: CLEOCIN T Apply to aa's scalp QD PRN   clotrimazole 1 % cream Commonly known as: LOTRIMIN Apply 1 application topically  2 (two) times daily as needed.   cyclobenzaprine 5 MG tablet Commonly known as: FLEXERIL Take 5 mg by mouth 2 (two) times daily as needed for muscle spasms.   DULoxetine 60 MG capsule Commonly known as: CYMBALTA TAKE 1 CAPSULE TWICE A DAY   gabapentin 300 MG capsule Commonly known as: NEURONTIN Take 600 mg by mouth 2 (two) times daily.   HYDROcodone-acetaminophen 5-325 MG tablet Commonly known as: NORCO/VICODIN Take 1 tablet by mouth every 4 (four) hours as needed.   Latuda 60 MG Tabs Generic drug: Lurasidone HCl TAKE 1 TABLET BY MOUTH DAILY WITH DINNER   meloxicam 15 MG tablet Commonly known as: MOBIC Take 15 mg by mouth daily.   omeprazole 20 MG capsule Commonly known as: PRILOSEC Take 1 capsule (20 mg total) by mouth daily as needed. Take 30 mins to 1 hr before breakfast   Semaglutide (1 MG/DOSE) 4 MG/3ML Sopn Inject 1 mg as directed once a week.   spironolactone 25 MG tablet Commonly known as: ALDACTONE Take 1 tablet (25 mg total) by mouth daily.   telmisartan 80 MG tablet Commonly known as: MICARDIS Take 1 tablet (80 mg total) by mouth daily.        Signature:  Chesley Mires, MD [place] Pager - [phone] 06/24/2021, 9:42 AM

## 2021-06-24 NOTE — Telephone Encounter (Signed)
Dr. Halford Chessman please advise if new order for proair is okay.

## 2021-06-24 NOTE — Patient Instructions (Signed)
Will arrange for pulmonary function test and overnight oxygen test while using CPAP  Will have [employer] refit your CPAP mask  Albuterol two puffs every 6 hours as needed for cough, wheeze, chest congestion, or shortness of breath  Follow up in 6 weeks with Dr. Halford Chessman or Nurse Practitioner

## 2021-06-24 NOTE — Telephone Encounter (Signed)
I have sent order for proair hfa to her pharmacy.

## 2021-06-24 NOTE — Telephone Encounter (Signed)
ATC patient-unavailable to leave vm due to mailbox being full. Will close encounter.

## 2021-06-24 NOTE — Telephone Encounter (Signed)
Patient Advocate Encounter  Received notification from [facility] ([employer]) that prior authorization for VENTOLIN HFA is required.   PA submitted on 2.16.23 Key [id number] Status is pending   [facility] will continue to follow  Luciano Cutter, CPhT Patient Advocate Phone: [phone] Fax:  [phone]  PATIENT'S INSURANCE [place].

## 2021-06-29 ENCOUNTER — Other Ambulatory Visit: Payer: Self-pay | Admitting: Adult Health

## 2021-06-29 ENCOUNTER — Other Ambulatory Visit: Payer: Self-pay | Admitting: Family

## 2021-06-29 ENCOUNTER — Encounter: Payer: Self-pay | Admitting: Pulmonary Disease

## 2021-06-29 ENCOUNTER — Other Ambulatory Visit: Payer: Self-pay | Admitting: Psychiatry

## 2021-06-29 DIAGNOSIS — F3289 Other specified depressive episodes: Secondary | ICD-10-CM

## 2021-06-29 DIAGNOSIS — I10 Essential (primary) hypertension: Secondary | ICD-10-CM

## 2021-06-29 DIAGNOSIS — K219 Gastro-esophageal reflux disease without esophagitis: Secondary | ICD-10-CM

## 2021-06-30 ENCOUNTER — Encounter: Payer: Self-pay | Admitting: Adult Health

## 2021-06-30 ENCOUNTER — Other Ambulatory Visit: Payer: Self-pay

## 2021-06-30 ENCOUNTER — Other Ambulatory Visit (INDEPENDENT_AMBULATORY_CARE_PROVIDER_SITE_OTHER): Payer: Medicare Other

## 2021-06-30 DIAGNOSIS — R0600 Dyspnea, unspecified: Secondary | ICD-10-CM | POA: Diagnosis not present

## 2021-06-30 DIAGNOSIS — E871 Hypo-osmolality and hyponatremia: Secondary | ICD-10-CM

## 2021-06-30 LAB — CBC WITH DIFFERENTIAL/PLATELET
Basophils Absolute: 0 10*3/uL (ref 0.0–0.1)
Basophils Relative: 0.4 % (ref 0.0–3.0)
Eosinophils Absolute: 0.2 10*3/uL (ref 0.0–0.7)
Eosinophils Relative: 2.1 % (ref 0.0–5.0)
HCT: 37.7 % (ref 36.0–46.0)
Hemoglobin: 13 g/dL (ref 12.0–15.0)
Lymphocytes Relative: 27.7 % (ref 12.0–46.0)
Lymphs Abs: 2.6 10*3/uL (ref 0.7–4.0)
MCHC: 34.4 g/dL (ref 30.0–36.0)
MCV: 95.4 fl (ref 78.0–100.0)
Monocytes Absolute: 0.4 10*3/uL (ref 0.1–1.0)
Monocytes Relative: 4.4 % (ref 3.0–12.0)
Neutro Abs: 6.2 10*3/uL (ref 1.4–7.7)
Neutrophils Relative %: 65.4 % (ref 43.0–77.0)
Platelets: 208 10*3/uL (ref 150.0–400.0)
RBC: 3.95 Mil/uL (ref 3.87–5.11)
RDW: 13 % (ref 11.5–15.5)
WBC: 9.5 10*3/uL (ref 4.0–10.5)

## 2021-06-30 LAB — COMPREHENSIVE METABOLIC PANEL
ALT: 13 U/L (ref 0–35)
AST: 12 U/L (ref 0–37)
Albumin: 4.5 g/dL (ref 3.5–5.2)
Alkaline Phosphatase: 73 U/L (ref 39–117)
BUN: 18 mg/dL (ref 6–23)
CO2: 28 mEq/L (ref 19–32)
Calcium: 9.4 mg/dL (ref 8.4–10.5)
Chloride: 97 mEq/L (ref 96–112)
Creatinine, Ser: 1.16 mg/dL (ref 0.40–1.20)
GFR: 51.27 mL/min — ABNORMAL LOW (ref >60.00)
Glucose, Bld: 114 mg/dL — ABNORMAL HIGH (ref 70–99)
Potassium: 4.1 mEq/L (ref 3.5–5.1)
Sodium: 130 mEq/L — ABNORMAL LOW (ref 135–145)
Total Bilirubin: 0.6 mg/dL (ref 0.2–1.2)
Total Protein: 6.6 g/dL (ref 6.0–8.3)

## 2021-07-01 ENCOUNTER — Ambulatory Visit: Payer: Medicare Other | Admitting: Adult Health

## 2021-07-01 ENCOUNTER — Ambulatory Visit
Admission: RE | Admit: 2021-07-01 | Discharge: 2021-07-01 | Disposition: A | Discharge status: Home / Self Care | Payer: Medicare Other | Source: Ambulatory Visit | Attending: Family | Admitting: Family

## 2021-07-01 DIAGNOSIS — R2232 Localized swelling, mass and lump, left upper limb: Secondary | ICD-10-CM | POA: Insufficient documentation

## 2021-07-01 DIAGNOSIS — R928 Other abnormal and inconclusive findings on diagnostic imaging of breast: Secondary | ICD-10-CM | POA: Diagnosis present

## 2021-07-05 ENCOUNTER — Telehealth: Payer: Self-pay | Admitting: *Deleted

## 2021-07-05 ENCOUNTER — Telehealth: Payer: Self-pay

## 2021-07-05 ENCOUNTER — Other Ambulatory Visit: Payer: Self-pay | Admitting: Adult Health

## 2021-07-05 ENCOUNTER — Other Ambulatory Visit: Payer: Self-pay

## 2021-07-05 ENCOUNTER — Telehealth: Payer: Self-pay | Admitting: Adult Health

## 2021-07-05 DIAGNOSIS — R7989 Other specified abnormal findings of blood chemistry: Secondary | ICD-10-CM

## 2021-07-05 DIAGNOSIS — E871 Hypo-osmolality and hyponatremia: Secondary | ICD-10-CM

## 2021-07-05 NOTE — Telephone Encounter (Signed)
Please place future orders for lab appt.  

## 2021-07-05 NOTE — Telephone Encounter (Signed)
Lm for pt to cb re : lab results

## 2021-07-05 NOTE — Progress Notes (Signed)
Please call her to see how she is feeling, her sodium is low again, would like her to come in today for recheck CMP today if possible to confirm.   Please add CMP, Magnesium - to labs and have patient come today to have rechecked and let me know how she is feeling.

## 2021-07-05 NOTE — Telephone Encounter (Signed)
Patient returned office phone call. 

## 2021-07-06 ENCOUNTER — Other Ambulatory Visit: Payer: Self-pay

## 2021-07-06 ENCOUNTER — Other Ambulatory Visit: Payer: Self-pay | Admitting: Adult Health

## 2021-07-06 ENCOUNTER — Other Ambulatory Visit (INDEPENDENT_AMBULATORY_CARE_PROVIDER_SITE_OTHER): Payer: Medicare Other

## 2021-07-06 ENCOUNTER — Telehealth: Payer: Self-pay

## 2021-07-06 DIAGNOSIS — R7989 Other specified abnormal findings of blood chemistry: Secondary | ICD-10-CM

## 2021-07-06 DIAGNOSIS — I10 Essential (primary) hypertension: Secondary | ICD-10-CM

## 2021-07-06 LAB — COMPREHENSIVE METABOLIC PANEL
ALT: 11 U/L (ref 0–35)
AST: 9 U/L (ref 0–37)
Albumin: 4.3 g/dL (ref 3.5–5.2)
Alkaline Phosphatase: 72 U/L (ref 39–117)
BUN: 16 mg/dL (ref 6–23)
CO2: 25 mEq/L (ref 19–32)
Calcium: 9.3 mg/dL (ref 8.4–10.5)
Chloride: 97 mEq/L (ref 96–112)
Creatinine, Ser: 1.28 mg/dL — ABNORMAL HIGH (ref 0.40–1.20)
GFR: 45.55 mL/min — ABNORMAL LOW (ref >60.00)
Glucose, Bld: 84 mg/dL (ref 70–99)
Potassium: 4.3 mEq/L (ref 3.5–5.1)
Sodium: 131 mEq/L — ABNORMAL LOW (ref 135–145)
Total Bilirubin: 0.6 mg/dL (ref 0.2–1.2)
Total Protein: 6.6 g/dL (ref 6.0–8.3)

## 2021-07-06 LAB — CBC WITH DIFFERENTIAL/PLATELET
Basophils Absolute: 0.1 10*3/uL (ref 0.0–0.1)
Basophils Relative: 0.6 % (ref 0.0–3.0)
Eosinophils Absolute: 0.2 10*3/uL (ref 0.0–0.7)
Eosinophils Relative: 1.8 % (ref 0.0–5.0)
HCT: 37.4 % (ref 36.0–46.0)
Hemoglobin: 12.9 g/dL (ref 12.0–15.0)
Lymphocytes Relative: 18.4 % (ref 12.0–46.0)
Lymphs Abs: 1.7 10*3/uL (ref 0.7–4.0)
MCHC: 34.6 g/dL (ref 30.0–36.0)
MCV: 94.8 fl (ref 78.0–100.0)
Monocytes Absolute: 0.7 10*3/uL (ref 0.1–1.0)
Monocytes Relative: 7 % (ref 3.0–12.0)
Neutro Abs: 6.8 10*3/uL (ref 1.4–7.7)
Neutrophils Relative %: 72.2 % (ref 43.0–77.0)
Platelets: 216 10*3/uL (ref 150.0–400.0)
RBC: 3.94 Mil/uL (ref 3.87–5.11)
RDW: 13.1 % (ref 11.5–15.5)
WBC: 9.5 10*3/uL (ref 4.0–10.5)

## 2021-07-06 LAB — MAGNESIUM: Magnesium: 2 mg/dL (ref 1.5–2.5)

## 2021-07-06 MED ORDER — SPIRONOLACTONE 25 MG PO TABS: 12.5000 mg | ORAL_TABLET | Freq: Every day | ORAL | 1 refills | Status: DC

## 2021-07-06 NOTE — Progress Notes (Signed)
07/07/21 7:08 PM   Susan [name] 1960/12/19 [id number] g  Referring provider:  Doreen Beam, FNP [address]  [place] [address] No chief complaint on file.    HPI: Susan [name] is a 61 y.o.female who presents today for further evaluation of renal cyst.   She was seen by her PCP, Laverna Peace, FNP, on 05/19/2021. She was having uncontrollef hypertention and was scheduled to undergo renal artery duplex. Renal artery dupex ultrasound on 06/11/2021 visualized elevated renal artery velocities, at the RIGHT mid and at the origin of LEFT renal artery. Findings are suspicious for bilateral renal artery stenosis, and a 3.8 cm LEFT exophytic renal cyst.  To further evaluate renal arter stenoisis she underwent a CTA abdomen and pelvis on 06/15/2021 Multiple low-attenuation lesions of varying density throughout both kidneys. And indeterminate density lesion exophytic from the right lower pole measures up to 7 mm in diameter and may represent a complex cyst versus a small renal neoplasm. Consider further evaluation with MRI of the abdomen in 3-6 months. Omental fat containing midline abdominal wall hernia in the mid epigastrium and a second fat containing abdominal wall hernia just caudal to the umbilicus.   She is doing well today and denies any pain in her kidneys.   PMH: Past Medical History:  Diagnosis Date   ADD (attention deficit disorder)    Alcohol abuse    Anemia    Anxiety    Aortic atherosclerosis ([place]) 02/20/2018   Chest CT Sept 2019   Arthritis    rheumatoid arthritis   Asthma    Bipolar disorder ([place])    Constipation    Degenerative disc disease at L5-S1 level 09/28/2016   See ortho note May 2018   Depression    bipolar, hx of suicide attempt   Diabetes mellitus, type 2 ([place])    Diverticulitis 2013   Drug use    Dyspnea    with exertion   GERD (gastroesophageal reflux disease)    H/O suicide attempt    slit wrists   Hepatitis C  06/26/2014   Hip pain    History of MRSA infection 2013   HLD (hyperlipidemia)    Hypertension    Hypothyroidism    Incisional hernia 11/08/2012   Knee pain    Multinodular thyroid    OSA (obstructive sleep apnea)    Osteoporosis    Post-traumatic osteoarthritis of right knee 09/24/2015   Recurrent ventral hernia 11/08/2012   Status post total right knee replacement using cement 05/10/2016   Vitamin B12 deficiency    Vitamin D deficiency disease     Surgical History: Past Surgical History:  Procedure Laterality Date   BILATERAL SALPINGOOPHORECTOMY     due to abnormal mass   BREAST BIOPSY Left    neg   BREAST SURGERY Left 20 yrs ago   CESAREAN SECTION     COLONOSCOPY     COLONOSCOPY WITH PROPOFOL N/A 10/16/2017   Procedure: COLONOSCOPY WITH PROPOFOL;  Surgeon: Jonathon Bellows, MD;  Location: [facility] ENDOSCOPY;  Service: Gastroenterology;  Laterality: N/A;   COLONOSCOPY WITH PROPOFOL N/A 02/16/2021   Procedure: COLONOSCOPY WITH PROPOFOL;  Surgeon: Jonathon Bellows, MD;  Location: [facility] ENDOSCOPY;  Service: Gastroenterology;  Laterality: N/A;   HERNIA REPAIR  06/2011, July 2014   Ventral wall repair with Physiomesh   HERNIA REPAIR     2nd.vental wall repair   JOINT REPLACEMENT Right    knee   TONSILLECTOMY     TOTAL HIP ARTHROPLASTY  Left 07/23/2019   Procedure: TOTAL HIP ARTHROPLASTY;  Surgeon: Corky Mull, MD;  Location: [facility] ORS;  Service: Orthopedics;  Laterality: Left;   TOTAL KNEE ARTHROPLASTY Right 05/10/2016   Procedure: TOTAL KNEE ARTHROPLASTY;  Surgeon: Corky Mull, MD;  Location: [facility] ORS;  Service: Orthopedics;  Laterality: Right;   TUBAL LIGATION      Home Medications:  Allergies as of 07/07/2021       Reactions   Wellbutrin [bupropion]    Patient reports made " deathly sick " years ago at appointment 03/18/20.   Lasix [furosemide] Other (See Comments)   Electrolyte imbalance        Medication List        Accurate as of July 07, 2021 11:59 PM. If you have any  questions, ask your nurse or doctor.          Accu-Chek Guide test strip Generic drug: glucose blood TEST UP TO FOUR TIMES A DAY   Accu-Chek Softclix Lancets lancets TEST 4 TIMES A DAY   acetaminophen 325 MG tablet Commonly known as: TYLENOL Take 650 mg by mouth every 6 (six) hours as needed.   albuterol 108 (90 Base) MCG/ACT inhaler Commonly known as: ProAir HFA Inhale 2 puffs into the lungs every 6 (six) hours as needed for wheezing or shortness of breath.   amLODipine 10 MG tablet Commonly known as: NORVASC Take 1 tablet (10 mg total) by mouth daily.   atorvastatin 10 MG tablet Commonly known as: LIPITOR TAKE 1 TABLET (10 MG TOTAL) BY MOUTH AT BEDTIME.   blood glucose meter kit and supplies Dispense based on patient and insurance preference. Use up to four times daily as directed. (FOR ICD-10 E10.9, E11.9).   carvedilol 25 MG tablet Commonly known as: COREG Take 1 tablet (25 mg total) by mouth 2 (two) times daily with a meal. Monitor heart rate if going below 60 need to notify the office, seek care if afterhours.   clindamycin 1 % external solution Commonly known as: CLEOCIN T Apply to aa's scalp QD PRN   clotrimazole 1 % cream Commonly known as: LOTRIMIN Apply 1 application topically 2 (two) times daily as needed.   cyclobenzaprine 5 MG tablet Commonly known as: FLEXERIL Take 5 mg by mouth 2 (two) times daily as needed for muscle spasms.   DULoxetine 60 MG capsule Commonly known as: CYMBALTA TAKE 1 CAPSULE TWICE A DAY   gabapentin 300 MG capsule Commonly known as: NEURONTIN Take 600 mg by mouth 2 (two) times daily.   HYDROcodone-acetaminophen 5-325 MG tablet Commonly known as: NORCO/VICODIN Take 1 tablet by mouth every 4 (four) hours as needed.   Latuda 60 MG Tabs Generic drug: Lurasidone HCl TAKE 1 TABLET BY MOUTH DAILY WITH DINNER   meloxicam 15 MG tablet Commonly known as: MOBIC Take 15 mg by mouth daily.   omeprazole 20 MG capsule Commonly  known as: PRILOSEC TAKE 1 CAPSULE BY MOUTH DAILY AS NEEDED TAKE 30 MINUTES BEFORE BREAKFAST   Semaglutide (1 MG/DOSE) 4 MG/3ML Sopn Inject 1 mg as directed once a week.   spironolactone 25 MG tablet Commonly known as: ALDACTONE Take 0.5 tablets (12.5 mg total) by mouth daily.   telmisartan 80 MG tablet Commonly known as: MICARDIS Take 1 tablet (80 mg total) by mouth daily.        Allergies:  Allergies  Allergen Reactions   Wellbutrin [Bupropion]     Patient reports made " deathly sick " years ago at appointment 03/18/20.  Lasix [Furosemide] Other (See Comments)    Electrolyte imbalance    Family History: Family History  Problem Relation Age of Onset   Arthritis Mother    Asthma Mother    Mental illness Mother    Thyroid disease Mother    COPD Mother    Heart disease Mother    Congestive Heart Failure Mother    Alcohol abuse Mother    Eating disorder Mother    Bipolar disorder Mother    Alcohol abuse Father    Heart attack Father    Alcohol abuse Sister    Drug abuse Sister    Mental illness Sister    Mental illness Sister    Fibromyalgia Sister    Obesity Sister    Pneumonia Sister    Depression Sister    Mental illness Sister    Alcohol abuse Sister    Drug abuse Sister    Arthritis Brother    Mental illness Brother    Cancer Brother        non-hodkins lymphoma   Drug abuse Daughter    Drug abuse Son    Alcohol abuse Son    Drug abuse Son    Alcohol abuse Son    Diabetes Neg Hx    Stroke Neg Hx    Breast cancer Neg Hx    Thyroid cancer Neg Hx     Social History:  reports that she has been smoking cigarettes. She has a 61.50 pack-year smoking history. She has never used smokeless tobacco. She reports that she does not drink alcohol and does not use drugs.   Physical Exam: BP 124/74    Pulse 76    Ht '5\' 2"'  (1.575 m)    Wt 210 lb (95.3 kg)    BMI 38.41 kg/m   Constitutional:  Alert and oriented, No acute distress. HEENT: [place] AT, moist mucus  membranes.  Trachea midline, no masses. Cardiovascular: No clubbing, cyanosis, or edema. Respiratory: Normal respiratory effort, no increased work of breathing. Skin: No rashes, bruises or suspicious lesions. Neurologic: Grossly intact, no focal deficits, moving all 4 extremities. Psychiatric: Normal mood and affect.  Laboratory Data:  Lab Results  Component Value Date   CREATININE 1.28 (H) 07/06/2021   Lab Results  Component Value Date   HGBA1C 5.7 05/19/2021    Urinalysis Negative  Pertinent Imaging CLINICAL DATA:  Uncontrolled hypertension. Evaluate for renal artery stenosis.   EXAM: CTA ABDOMEN AND PELVIS WITHOUT AND WITH CONTRAST   TECHNIQUE: Multidetector CT imaging of the abdomen and pelvis was performed using the standard protocol during bolus administration of intravenous contrast. Multiplanar reconstructed images and MIPs were obtained and reviewed to evaluate the vascular anatomy.   RADIATION DOSE REDUCTION: This exam was performed according to the departmental dose-optimization program which includes automated exposure control, adjustment of the mA and/or kV according to patient size and/or use of iterative reconstruction technique.   CONTRAST:  163m OMNIPAQUE IOHEXOL 350 MG/ML SOLN   COMPARISON:  CT scan of the abdomen and pelvis 03/06/2018   FINDINGS: VASCULAR   Aorta: Normal caliber aorta without aneurysm, dissection, vasculitis or significant stenosis. Trace atherosclerotic calcifications.   Celiac/SMA: Celicomesenteric trunk. This is a known anatomic variant. No significant stenosis, aneurysm or dissection.   Renals: Both renal arteries are patent without evidence of aneurysm, dissection, vasculitis, fibromuscular dysplasia or significant stenosis.   IMA: Patent without evidence of aneurysm, dissection, vasculitis or significant stenosis.   Inflow: Patent without evidence of aneurysm, dissection, vasculitis or significant  stenosis.    Veins: No focal venous abnormality.   Review of the MIP images confirms the above findings.   NON-VASCULAR   Lower chest: No acute abnormality.   Hepatobiliary: No focal liver abnormality is seen. No gallstones, gallbladder wall thickening, or biliary dilatation.   Pancreas: Unremarkable. No pancreatic ductal dilatation or surrounding inflammatory changes.   Spleen: No splenic injury or perisplenic hematoma.   Adrenals/Urinary Tract: Normal adrenal glands bilaterally. Multiple low-attenuation lesions of varying density throughout both kidneys. The larger lesions are consistent with simple cysts. There is in indeterminate density lesion exophytic from the right lower pole which measures up to 7 mm in diameter and may represent a complex cyst versus a small renal neoplasm. No hydronephrosis. The bladder is unremarkable.   Stomach/Bowel: Stomach is within normal limits. Appendix appears normal. No evidence of bowel wall thickening, distention, or inflammatory changes. Small duodenal diverticulum. Colonic diverticular disease without CT evidence of active inflammation.   Lymphatic: No suspicious lymphadenopathy.   Reproductive: Uterus and bilateral adnexa are unremarkable.   Other: Omental fat containing midline abdominal wall hernia (image 26 series 14). Infraumbilical fat containing abdominal wall hernia. No ascites.   Musculoskeletal: No acute fracture or aggressive appearing lytic or blastic osseous lesion. Left hip arthroplasty. Dextroconvex scoliosis of the lumbar spine with the apex at L3. Multilevel degenerative disc disease and lower lumbar facet arthropathy.   IMPRESSION: VASCULAR   1. No hemodynamically significant stenosis in either renal artery and no evidence of fibromuscular dysplasia. 2. 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 3. Scattered aortic  atherosclerotic vascular calcifications. Aortic Atherosclerosis (ICD10-I70.0).   NON-VASCULAR   1. Multiple low-attenuation lesions of varying density throughout both kidneys. And indeterminate density lesion exophytic from the right lower pole measures up to 7 mm in diameter and may represent a complex cyst versus a small renal neoplasm. Consider further evaluation with MRI of the abdomen in 3-6 months. 2. Omental fat containing midline abdominal wall hernia in the mid epigastrium and a second fat containing abdominal wall hernia just caudal to the umbilicus. 3. Dextroconvex scoliosis of the lumbar spine with associated multilevel degenerative disc disease and facet arthropathy. 4. Colonic diverticular disease without CT evidence of active inflammation.   Signed,   Criselda Peaches, MD, [place]   [facility]   [facility]     Electronically Signed   By: Jacqulynn Cadet M.D.   On: 06/15/2021 16:20     Assessment & Plan:   Left renal cyst  -  We discussed this in detail and in regards to the spectrum of renal masses which includes cysts (pure cysts are considered benign), solid masses and everything in between. - For cystic renal masses, we reviewed the Bosniak classification and discussed that Bosniak 3 lesions harbor a 50% chance of malignancy whereas Bosniak 4 cysts have a solid and 90-95% are malignant in nature.  - Will monitor with MRI in 6 months     I,Perris Tripathi,acting as a scribe for Hollice Espy, MD.,have documented all relevant documentation on the behalf of Hollice Espy, MD,as directed by  Hollice Espy, MD while in the presence of Hollice Espy, MD.  I have reviewed the above documentation for accuracy and completeness, and I agree with the above.   Hollice Espy, MD   [facility] [address] [place], [name] [address] [phone]

## 2021-07-06 NOTE — Telephone Encounter (Signed)
Patient aware of upcoming covid test. Nothing further needed.  

## 2021-07-06 NOTE — Telephone Encounter (Signed)
Called and LVM in regards to upcoming covid test appt. Will try again later. Routing to triage.

## 2021-07-06 NOTE — Progress Notes (Signed)
Meds ordered this encounter  Medications   spironolactone (ALDACTONE) 25 MG tablet    Sig: Take 0.5 tablets (12.5 mg total) by mouth daily.    Dispense:  90 tablet    Refill:  1    Medications Discontinued During This Encounter  Medication Reason   spironolactone (ALDACTONE) 25 MG tablet

## 2021-07-06 NOTE — Progress Notes (Signed)
Creatinine has increased mildly , avoid NSAID's and adequate hydration.  Sodium is confirmed low, spirolactone dose to be decreased from 25 mg she is currently taking to 12.5 mg once daily. I am hesitant to start sodium supplement with her hypertension.  Please have her use Pedialyte or electrolyte drink for 1- 2 days.  If she is able to come to the office this week I would schedule her a follow up blood pressure recheck. Recheck CMP 07/12/21 please add and schedule.  Please verify how she is feeling ? Strict  return precautions/ ER precautions advised.

## 2021-07-07 ENCOUNTER — Other Ambulatory Visit
Admission: RE | Admit: 2021-07-07 | Discharge: 2021-07-07 | Disposition: A | Discharge status: Home / Self Care | Payer: Medicare Other | Source: Ambulatory Visit | Attending: Pulmonary Disease | Admitting: Pulmonary Disease

## 2021-07-07 ENCOUNTER — Encounter: Payer: Self-pay | Admitting: Urology

## 2021-07-07 ENCOUNTER — Ambulatory Visit (INDEPENDENT_AMBULATORY_CARE_PROVIDER_SITE_OTHER): Payer: Medicare Other | Admitting: Psychology

## 2021-07-07 ENCOUNTER — Other Ambulatory Visit: Payer: Self-pay | Admitting: Adult Health

## 2021-07-07 ENCOUNTER — Ambulatory Visit (INDEPENDENT_AMBULATORY_CARE_PROVIDER_SITE_OTHER): Payer: Medicare Other | Admitting: Urology

## 2021-07-07 ENCOUNTER — Other Ambulatory Visit: Payer: Self-pay

## 2021-07-07 VITALS — BP 124/74 | HR 76 | Ht 62.0 in | Wt 210.0 lb

## 2021-07-07 DIAGNOSIS — Z01812 Encounter for preprocedural laboratory examination: Secondary | ICD-10-CM | POA: Diagnosis present

## 2021-07-07 DIAGNOSIS — F3176 Bipolar disorder, in full remission, most recent episode depressed: Secondary | ICD-10-CM

## 2021-07-07 DIAGNOSIS — N2889 Other specified disorders of kidney and ureter: Secondary | ICD-10-CM

## 2021-07-07 DIAGNOSIS — N281 Cyst of kidney, acquired: Secondary | ICD-10-CM

## 2021-07-07 DIAGNOSIS — Z20822 Contact with and (suspected) exposure to covid-19: Secondary | ICD-10-CM | POA: Diagnosis not present

## 2021-07-07 DIAGNOSIS — E119 Type 2 diabetes mellitus without complications: Secondary | ICD-10-CM

## 2021-07-07 DIAGNOSIS — F4310 Post-traumatic stress disorder, unspecified: Secondary | ICD-10-CM

## 2021-07-07 NOTE — Progress Notes (Signed)
[facility] Counselor/Therapist Progress Note ? ?Patient ID: Susan [name], MRN: [id number]  ? ?Date: 07/07/21 ? ?Time Spent: 11:04 am -  11:36 am : 32 Minutes ? ?Treatment Type: Individual Therapy. ? ?Reported Symptoms: Anxiety and interpersonal stressors.   ? ?Mental Status Exam: ?Appearance:  NA     ?Behavior: Appropriate  ?Motor: Normal  ?Speech/Language:  Clear and Coherent  ?Affect: Congruent  ?Mood: normal  ?Thought process: normal  ?Thought content:   WNL  ?Sensory/Perceptual disturbances:   WNL  ?Orientation: oriented to person, place, time/date, and situation  ?Attention: Good  ?Concentration: Good  ?Memory: WNL  ?Fund of knowledge:  Good  ?Insight:   Good  ?Judgment:  Good  ?Impulse Control: Good  ? ?Risk Assessment: ?Danger to Self:  No ?Self-injurious Behavior: This ?Danger to Others: No ?Duty to Warn:no ?Physical Aggression / Violence:No  ?Access to Firearms a concern: No  ?Gang Involvement:No  ? ?Subjective:  ? ?Martie L Speagle participated from home, via phone and consented to treatment. Therapist participated from office. We met online due to [place] pandemic. Madolyn noted some interpersonal strain with her sister. Najma noted numerous medical visits due to varying illnesses and noted this occurring after her previous diagnosis of COVID. We explored this, during the session, and the effect on her mood. Tallyn noted grieving regarding her son who passed almost 1 year ago due to an overdose. We began processing the anniversary and worked on identifying ways to manage and cope with this loss. We reviewed coping during the session. She noted her commitment towards this goal between sessions.  Therapist encouraged continued mindfulness and self-care.  A follow-up was scheduled. Therapist praised Ridgeley for her effort and energy between and during sessions and provided supportive therapy. ? ?Interventions: Grief Therapy ? ?Diagnosis: Bipolar disorder, in full remission, most recent episode  depressed ([place]) ? ?PTSD (post-traumatic stress disorder) ? ? ?Plan: Patient is to use CBT, BA, Problem Solving, Solution Focused, mindfulness,  and coping skills to help manage decrease symptoms associated with their diagnosis. ?  ?Related Problem: Alleviate depressive symptoms and return to previous level of effective functioning. ?Description: Learn and implement problem-solving and decision-making skills. ?Target Date: 2022-04-19 ?Progress: 0% ? ?Related Problem: Appropriately grieve the loss in order to normalize mood and to return to previously adaptive level of functioning. ?Description: Use mindfulness and acceptance strategies to reduce experiential and cognitive avoidance and increase value-based behavior. ?Target Date: 2022-04-19 ?Progress: 0% ? ?Related Problem: Appropriately grieve the loss in order to normalize mood and to return to previously adaptive level of functioning. ?Description: Increasingly verbalize hopeful and positive statements regarding self, others, and the future. ?Target Date: 2022-04-19 ?Progress: 20% ? ?Related Problem: Appropriately grieve the loss in order to normalize mood and to return to previously adaptive level of functioning. ?Description: Describe current and past experiences with depression including their impact on functioning and attempts to resolve it. ?Target Date: 2022-04-19 ?Progress: 10% ? ?Related Problem: Appropriately grieve the loss in order to normalize mood and to return to previously adaptive level of functioning. ?Description: Learn and implement conflict resolution skills to resolve interpersonal problems. ?Target Date: 2022-04-19 ?Progress: 30% ? ?Related Problem: Appropriately grieve the loss in order to normalize mood and to return to previously adaptive level of functioning. ?Description: Verbalize an understanding and resolution of current interpersonal problems. ?Target Date: 2022-04-19 ? ?Progress: 20% ? ?Related Problem: Appropriately grieve the loss  in order to normalize mood and to return to previously adaptive level of functioning. ?Description:  Implement mindfulness techniques for relapse prevention. ?Target Date: 2022-04-19 ?Progress: 10% ? ?Related Problem: Learn and implement coping skills that result in a reduction of anxiety and worry, and improved daily functioning. ?Description: Reestablish a consistent sleep-wake cycle. ?Target Date: 2022-04-19 ?Progress: 10% ? ?Related Problem: Learn and implement coping skills that result in a reduction of anxiety and worry, and improved daily functioning. ?Description: Learn to accept limitations in life and commit to tolerating, rather than avoiding, unpleasant emotions while accomplishing meaningful goals. ?Target Date: 2022-04-19 ?Progress: 10% ? ?Related Problem: Learn and implement coping skills that result in a reduction of anxiety and worry, and improved daily functioning. ?Description: Learn and implement problem-solving strategies for realistically addressing worries. ?Target Date: 2022-04-19 ?Progress: 10% ? ?Related Problem: Learn and implement coping skills that result in a reduction of anxiety and worry, and improved daily functioning. ?Description: Identify, challenge, and replace biased, fearful self-talk with positive, realistic, and empowering self-talk. ?Target Date: 2022-04-19 ?Progress: 20% ? ?Related Problem: Learn and implement coping skills that result in a reduction of anxiety and worry, and improved daily functioning. ?Description: Learn and implement a strategy to limit the association between various environmental settings and worry, delaying the worry until a designated "worry time." ?Target Date: 2022-04-19 ?Progress: 0% ? ?Related Problem: Learn and implement coping skills that result in a reduction of anxiety and worry, and improved daily functioning. ?Description: Identify and engage in pleasant activities on a daily basis. ?Target Date: 2022-04-19 ?Progress: 30% ? ?Related Problem:  Learn and implement coping skills that result in a reduction of anxiety and worry, and improved daily functioning. ?Description: Identify the major life conflicts from the past and present that form the basis for present anxiety. ?Target Date: 2022-04-19 ?Progress: 20% ? ?Related Problem: Learn and implement coping skills that result in a reduction of anxiety and worry, and improved daily functioning. ?Description: Cooperate with a medication evaluation by a physician. ?Target Date: 2022-04-19 ?Progress: 100% ? ? ?Buena Irish, LCSW ?

## 2021-07-08 ENCOUNTER — Ambulatory Visit: Payer: Medicare Other | Attending: Pulmonary Disease

## 2021-07-08 DIAGNOSIS — Z79899 Other long term (current) drug therapy: Secondary | ICD-10-CM | POA: Insufficient documentation

## 2021-07-08 DIAGNOSIS — Z7951 Long term (current) use of inhaled steroids: Secondary | ICD-10-CM | POA: Diagnosis not present

## 2021-07-08 DIAGNOSIS — R0609 Other forms of dyspnea: Secondary | ICD-10-CM | POA: Diagnosis present

## 2021-07-08 DIAGNOSIS — F17210 Nicotine dependence, cigarettes, uncomplicated: Secondary | ICD-10-CM | POA: Diagnosis not present

## 2021-07-08 LAB — PULMONARY FUNCTION TEST ARMC ONLY
DL/VA % pred: 70 %
DL/VA: 3.01 ml/min/mmHg/L
DLCO unc % pred: 72 %
DLCO unc: 13.6 ml/min/mmHg
FEF 25-75 Post: 0.79 L/sec
FEF 25-75 Pre: 1.29 L/sec
FEF2575-%Change-Post: -38 %
FEF2575-%Pred-Post: 35 %
FEF2575-%Pred-Pre: 57 %
FEV1-%Change-Post: -28 %
FEV1-%Pred-Post: 52 %
FEV1-%Pred-Pre: 73 %
FEV1-Post: 1.24 L
FEV1-Pre: 1.74 L
FEV1FVC-%Change-Post: -29 %
FEV1FVC-%Pred-Pre: 76 %
FEV6-%Change-Post: 0 %
FEV6-%Pred-Post: 97 %
FEV6-%Pred-Pre: 97 %
FEV6-Post: 2.87 L
FEV6-Pre: 2.88 L
FEV6FVC-%Change-Post: -1 %
FEV6FVC-%Pred-Post: 101 %
FEV6FVC-%Pred-Pre: 102 %
FVC-%Change-Post: 1 %
FVC-%Pred-Post: 95 %
FVC-%Pred-Pre: 94 %
FVC-Post: 2.94 L
FVC-Pre: 2.91 L
Post FEV1/FVC ratio: 42 %
Post FEV6/FVC ratio: 98 %
Pre FEV1/FVC ratio: 60 %
Pre FEV6/FVC Ratio: 99 %
RV % pred: 89 %
RV: 1.69 L
TLC % pred: 100 %
TLC: 4.78 L

## 2021-07-08 LAB — SARS CORONAVIRUS 2 (TAT 6-24 HRS): SARS Coronavirus 2: NEGATIVE

## 2021-07-08 MED ORDER — ALBUTEROL SULFATE (2.5 MG/3ML) 0.083% IN NEBU
2.5000 mg | INHALATION_SOLUTION | Freq: Once | RESPIRATORY_TRACT | Status: DC
  Filled 2021-07-08: qty 3

## 2021-07-08 MED ORDER — ALBUTEROL SULFATE (2.5 MG/3ML) 0.083% IN NEBU
2.5000 mg | INHALATION_SOLUTION | Freq: Once | RESPIRATORY_TRACT | Status: AC
  Administered 2021-07-08: 2.5 mg via RESPIRATORY_TRACT
  Filled 2021-07-08: qty 3

## 2021-07-09 LAB — URINALYSIS, COMPLETE
Bilirubin, UA: NEGATIVE
Glucose, UA: NEGATIVE
Ketones, UA: NEGATIVE
Leukocytes,UA: NEGATIVE
Nitrite, UA: NEGATIVE
Protein,UA: NEGATIVE
Specific Gravity, UA: 1.01 (ref 1.005–1.030)
Urobilinogen, Ur: 0.2 mg/dL (ref 0.2–1.0)
pH, UA: 5.5 (ref 5.0–7.5)

## 2021-07-09 LAB — MICROSCOPIC EXAMINATION

## 2021-07-12 ENCOUNTER — Other Ambulatory Visit: Payer: Self-pay | Admitting: Adult Health

## 2021-07-12 DIAGNOSIS — I10 Essential (primary) hypertension: Secondary | ICD-10-CM

## 2021-07-13 ENCOUNTER — Telehealth: Payer: Self-pay | Admitting: Pulmonary Disease

## 2021-07-13 NOTE — Telephone Encounter (Signed)
ATC patient about results. No answer and VM not set up  ?

## 2021-07-13 NOTE — Telephone Encounter (Signed)
ONO with CPAP 06/30/21 >> test time 7 hrs 59 min.  Basal SpO2 92.7%, low SpO2 79%.  Spent 4.8 min with an SpO2 < 88%. ? ?Please let her know her overnight oxygen test looked okay.  She doesn't need supplemental oxygen at night.  She should continue using CPAP. ?

## 2021-07-14 ENCOUNTER — Other Ambulatory Visit: Payer: Self-pay

## 2021-07-14 ENCOUNTER — Other Ambulatory Visit: Payer: Self-pay | Admitting: Adult Health

## 2021-07-14 ENCOUNTER — Ambulatory Visit (INDEPENDENT_AMBULATORY_CARE_PROVIDER_SITE_OTHER): Payer: Medicare Other | Admitting: Adult Health

## 2021-07-14 ENCOUNTER — Telehealth: Payer: Medicare Other

## 2021-07-14 ENCOUNTER — Encounter: Payer: Self-pay | Admitting: Adult Health

## 2021-07-14 VITALS — BP 136/80 | HR 75 | Temp 98.0°F | Ht 62.0 in | Wt 210.0 lb

## 2021-07-14 DIAGNOSIS — E119 Type 2 diabetes mellitus without complications: Secondary | ICD-10-CM | POA: Diagnosis not present

## 2021-07-14 DIAGNOSIS — E871 Hypo-osmolality and hyponatremia: Secondary | ICD-10-CM | POA: Diagnosis not present

## 2021-07-14 DIAGNOSIS — I10 Essential (primary) hypertension: Secondary | ICD-10-CM

## 2021-07-14 DIAGNOSIS — E785 Hyperlipidemia, unspecified: Secondary | ICD-10-CM

## 2021-07-14 DIAGNOSIS — I700 Atherosclerosis of aorta: Secondary | ICD-10-CM

## 2021-07-14 DIAGNOSIS — Z6838 Body mass index (BMI) 38.0-38.9, adult: Secondary | ICD-10-CM | POA: Insufficient documentation

## 2021-07-14 DIAGNOSIS — E669 Obesity, unspecified: Secondary | ICD-10-CM | POA: Diagnosis not present

## 2021-07-14 DIAGNOSIS — Z9989 Dependence on other enabling machines and devices: Secondary | ICD-10-CM

## 2021-07-14 DIAGNOSIS — G4733 Obstructive sleep apnea (adult) (pediatric): Secondary | ICD-10-CM

## 2021-07-14 LAB — COMPREHENSIVE METABOLIC PANEL
ALT: 12 U/L (ref 0–35)
AST: 13 U/L (ref 0–37)
Albumin: 4.4 g/dL (ref 3.5–5.2)
Alkaline Phosphatase: 71 U/L (ref 39–117)
BUN: 21 mg/dL (ref 6–23)
CO2: 24 mEq/L (ref 19–32)
Calcium: 9.7 mg/dL (ref 8.4–10.5)
Chloride: 96 mEq/L (ref 96–112)
Creatinine, Ser: 1.52 mg/dL — ABNORMAL HIGH (ref 0.40–1.20)
GFR: 37.06 mL/min — ABNORMAL LOW (ref >60.00)
Glucose, Bld: 96 mg/dL (ref 70–99)
Potassium: 4.3 mEq/L (ref 3.5–5.1)
Sodium: 130 mEq/L — ABNORMAL LOW (ref 135–145)
Total Bilirubin: 0.4 mg/dL (ref 0.2–1.2)
Total Protein: 7 g/dL (ref 6.0–8.3)

## 2021-07-14 LAB — MAGNESIUM: Magnesium: 1.9 mg/dL (ref 1.5–2.5)

## 2021-07-14 MED ORDER — SEMAGLUTIDE (1 MG/DOSE) 4 MG/3ML ~~LOC~~ SOPN: 1.0000 mg | PEN_INJECTOR | SUBCUTANEOUS | 1 refills | Status: DC

## 2021-07-14 MED ORDER — CARVEDILOL 25 MG PO TABS: 25.0000 mg | ORAL_TABLET | Freq: Two times a day (BID) | ORAL | 1 refills | Status: DC

## 2021-07-14 NOTE — Assessment & Plan Note (Signed)
She has had low sodium level and was advised to decrease spirolactone to 12.5 mg po qd instead of 25 mg she reports she did not have a pill splitter so she has been taking one tablet 25 mg every other day. She did not do any electrolyte supplements as advised.  ?

## 2021-07-14 NOTE — Patient Instructions (Signed)

## 2021-07-14 NOTE — Progress Notes (Signed)
Recheck CMP has been placed per PCP and pt is scheduled for 3/17 '@10'$ :45am  ?

## 2021-07-14 NOTE — Progress Notes (Signed)
Established Patient Office Visit  Subjective:  Patient ID: Susan [name], female    DOB: Aug 01, 1960  Age: 61 y.o. MRN: [id number]  CC:  Chief Complaint  Patient presents with   Follow-up    3 wk f/u , blood pressure check and labs.    HPI Susan [name] presents for follow up on hypertension and Diabetes.  She has had low sodium level and was advised to decrease spirolactone to 12.5 mg po qd instead of 25 mg she reports she did not have a pill splitter so she has been taking one tablet 25 mg every other day. She did not do any electrolyte supplements as advised.  Blood pressure at home has been running around 660'A systolic to 00'K systolic she reports. Heart rate has been over 60.   She did have another sleep study for her CPAP recently with pulmonary and she was called yesterday with report to continue CPAP and she has no need for supplemental oxygen.   She continues ozempic as directed without any adverse side effects. She has lost weight. She feels better and A1C is controlled. She denies any hypoglycemia episodes.  Patient  denies any fever, body aches,chills, rash, chest pain, shortness of breath, nausea, vomiting, or diarrhea.  Denies dizziness, lightheadedness, pre syncopal or syncopal episodes.   Past Medical History:  Diagnosis Date   ADD (attention deficit disorder)    Alcohol abuse    Anemia    Anxiety    Aortic atherosclerosis ([place]) 02/20/2018   Chest CT Sept 2019   Arthritis    rheumatoid arthritis   Asthma    Bipolar disorder ([place])    Constipation    Degenerative disc disease at L5-S1 level 09/28/2016   See ortho note May 2018   Depression    bipolar, hx of suicide attempt   Diabetes mellitus, type 2 ([place])    Diverticulitis 2013   Drug use    Dyspnea    with exertion   GERD (gastroesophageal reflux disease)    H/O suicide attempt    slit wrists   Hepatitis C 06/26/2014   Hip pain    History of MRSA infection 2013   HLD (hyperlipidemia)     Hypertension    Hypothyroidism    Incisional hernia 11/08/2012   Knee pain    Multinodular thyroid    OSA (obstructive sleep apnea)    Osteoporosis    Post-traumatic osteoarthritis of right knee 09/24/2015   Recurrent ventral hernia 11/08/2012   Status post total right knee replacement using cement 05/10/2016   Vitamin B12 deficiency    Vitamin D deficiency disease     Past Surgical History:  Procedure Laterality Date   BILATERAL SALPINGOOPHORECTOMY     due to abnormal mass   BREAST BIOPSY Left    neg   BREAST SURGERY Left 20 yrs ago   CESAREAN SECTION     COLONOSCOPY     COLONOSCOPY WITH PROPOFOL N/A 10/16/2017   Procedure: COLONOSCOPY WITH PROPOFOL;  Surgeon: Jonathon Bellows, MD;  Location: [facility] ENDOSCOPY;  Service: Gastroenterology;  Laterality: N/A;   COLONOSCOPY WITH PROPOFOL N/A 02/16/2021   Procedure: COLONOSCOPY WITH PROPOFOL;  Surgeon: Jonathon Bellows, MD;  Location: [facility] ENDOSCOPY;  Service: Gastroenterology;  Laterality: N/A;   HERNIA REPAIR  06/2011, July 2014   Ventral wall repair with Physiomesh   HERNIA REPAIR     2nd.vental wall repair   JOINT REPLACEMENT Right    knee   TONSILLECTOMY     TOTAL  HIP ARTHROPLASTY Left 07/23/2019   Procedure: TOTAL HIP ARTHROPLASTY;  Surgeon: Corky Mull, MD;  Location: [facility] ORS;  Service: Orthopedics;  Laterality: Left;   TOTAL KNEE ARTHROPLASTY Right 05/10/2016   Procedure: TOTAL KNEE ARTHROPLASTY;  Surgeon: Corky Mull, MD;  Location: [facility] ORS;  Service: Orthopedics;  Laterality: Right;   TUBAL LIGATION      Family History  Problem Relation Age of Onset   Arthritis Mother    Asthma Mother    Mental illness Mother    Thyroid disease Mother    COPD Mother    Heart disease Mother    Congestive Heart Failure Mother    Alcohol abuse Mother    Eating disorder Mother    Bipolar disorder Mother    Alcohol abuse Father    Heart attack Father    Alcohol abuse Sister    Drug abuse Sister    Mental illness Sister    Mental illness  Sister    Fibromyalgia Sister    Obesity Sister    Pneumonia Sister    Depression Sister    Mental illness Sister    Alcohol abuse Sister    Drug abuse Sister    Arthritis Brother    Mental illness Brother    Cancer Brother        non-hodkins lymphoma   Drug abuse Daughter    Drug abuse Son    Alcohol abuse Son    Drug abuse Son    Alcohol abuse Son    Diabetes Neg Hx    Stroke Neg Hx    Breast cancer Neg Hx    Thyroid cancer Neg Hx     Social History   Socioeconomic History   Marital status: Widowed    Spouse name: Not on file   Number of children: 3   Years of education: Not on file   Highest education level: Some college, no degree  Occupational History   Occupation: disabled  Tobacco Use   Smoking status: Every Day    Packs/day: 1.50    Years: 41.00    Pack years: 61.50    Types: Cigarettes   Smokeless tobacco: Never   Tobacco comments:    1ppd 06/24/2021  Vaping Use   Vaping Use: Former   Quit date: 07/15/2017  Substance and Sexual Activity   Alcohol use: No    Alcohol/week: 0.0 standard drinks    Comment: sober since October 2018   Drug use: No    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   Sexual activity: Not Currently  Other Topics Concern   Not on file  Social History Narrative   Not on file   Social Determinants of Health   Financial Resource Strain: Low Risk    Difficulty of Paying Living Expenses: Not hard at all  Food Insecurity: No Food Insecurity   Worried About Charity fundraiser in the Last Year: Never true   [place] in the Last Year: Never true  Transportation Needs: No Transportation Needs   Lack of Transportation (Medical): No   Lack of Transportation (Non-Medical): No  Physical Activity: Sufficiently Active   Days of Exercise per Week: 5 days   Minutes of Exercise per Session: 30 min  Stress: No Stress Concern Present   Feeling of Stress : Not at all  Social Connections: Moderately Isolated   Frequency of  Communication with Friends and Family: More than three times a week   Frequency of Social  Gatherings with Friends and Family: Three times a week   Attends Religious Services: Never   Active Member of Clubs or Organizations: Yes   Attends Archivist Meetings: More than 4 times per year   Marital Status: Widowed  Human resources officer Violence: Not At Risk   Fear of Current or Ex-Partner: No   Emotionally Abused: No   Physically Abused: No   Sexually Abused: No    Outpatient Medications Prior to Visit  Medication Sig Dispense Refill   Accu-Chek Softclix Lancets lancets TEST 4 TIMES A DAY 100 each 1   acetaminophen (TYLENOL) 325 MG tablet Take 650 mg by mouth every 6 (six) hours as needed.     albuterol (PROAIR HFA) 108 (90 Base) MCG/ACT inhaler Inhale 2 puffs into the lungs every 6 (six) hours as needed for wheezing or shortness of breath. 1 each 5   amLODipine (NORVASC) 10 MG tablet Take 1 tablet (10 mg total) by mouth daily. 90 tablet 3   atorvastatin (LIPITOR) 10 MG tablet TAKE 1 TABLET (10 MG TOTAL) BY MOUTH AT BEDTIME. 90 tablet 3   blood glucose meter kit and supplies Dispense based on patient and insurance preference. Use up to four times daily as directed. (FOR ICD-10 E10.9, E11.9). 1 each 0   clindamycin (CLEOCIN T) 1 % external solution Apply to aa's scalp QD PRN 60 mL 3   clotrimazole (LOTRIMIN) 1 % cream Apply 1 application topically 2 (two) times daily as needed.     cyclobenzaprine (FLEXERIL) 5 MG tablet Take 5 mg by mouth 2 (two) times daily as needed for muscle spasms.      DULoxetine (CYMBALTA) 60 MG capsule TAKE 1 CAPSULE TWICE A DAY 180 capsule 0   gabapentin (NEURONTIN) 300 MG capsule Take 600 mg by mouth 2 (two) times daily.      glucose blood (ACCU-CHEK GUIDE) test strip TEST UP TO FOUR TIMES A DAY 100 each 0   HYDROcodone-acetaminophen (NORCO/VICODIN) 5-325 MG tablet Take 1 tablet by mouth every 4 (four) hours as needed.     LATUDA 60 MG TABS TAKE 1 TABLET BY  MOUTH DAILY WITH DINNER 30 tablet 1   meloxicam (MOBIC) 15 MG tablet Take 15 mg by mouth daily.     omeprazole (PRILOSEC) 20 MG capsule TAKE 1 CAPSULE BY MOUTH DAILY AS NEEDED TAKE 30 MINUTES BEFORE BREAKFAST 90 capsule 1   spironolactone (ALDACTONE) 25 MG tablet Take 0.5 tablets (12.5 mg total) by mouth daily. 90 tablet 1   telmisartan (MICARDIS) 80 MG tablet Take 1 tablet (80 mg total) by mouth daily. 30 tablet 2   carvedilol (COREG) 25 MG tablet Take 1 tablet (25 mg total) by mouth 2 (two) times daily with a meal. Monitor heart rate if going below 60 need to notify the office, seek care if afterhours. 60 tablet 1   Semaglutide, 1 MG/DOSE, 4 MG/3ML SOPN Inject 1 mg as directed once a week. 3 mL 1   Facility-Administered Medications Prior to Visit  Medication Dose Route Frequency Provider Last Rate Last Admin   albuterol (PROVENTIL) (2.5 MG/3ML) 0.083% nebulizer solution 2.5 mg  2.5 mg Nebulization Once Chesley Mires, MD        Allergies  Allergen Reactions   Wellbutrin [Bupropion]     Patient reports made " deathly sick " years ago at appointment 03/18/20.   Lasix [Furosemide] Other (See Comments)    Electrolyte imbalance    ROS Review of Systems  Constitutional: Negative.   HENT: Negative.  Eyes: Negative.   Respiratory: Negative.    Cardiovascular: Negative.   Gastrointestinal: Negative.   Genitourinary: Negative.      Objective:    Physical Exam   Vitals with BMI 07/14/2021 07/07/2021 06/24/2021  Height '5\' 2"'  '5\' 2"'  '5\' 2"'   Weight 210 lbs 210 lbs 210 lbs 10 oz  BMI 38.4 82.9 56.21  Systolic 308 657 846  Diastolic 80 74 68  Pulse 75 76 68      General: Appearance:    Obese female in no acute distress  Eyes:    PERRL, conjunctiva/corneas clear, EOM's intact       Lungs:     Clear to auscultation bilaterally, respirations unlabored  Heart:    Normal heart rate. Normal rhythm. No murmurs, rubs, or gallops.    MS:   All extremities are intact.    Neurologic:   Awake,  alert, oriented x 3. No apparent focal neurological           defect.    Abdomen: soft and non-tender without masses, organomegaly or hernias noted.  No guarding or rebound  BP 136/80 (BP Location: Left Arm, Patient Position: Sitting, Cuff Size: Large)    Pulse 75    Temp 98 F (36.7 C) (Oral)    Ht '5\' 2"'  (1.575 m)    Wt 210 lb (95.3 kg)    SpO2 99%    BMI 38.41 kg/m  Wt Readings from Last 3 Encounters:  07/14/21 210 lb (95.3 kg)  07/07/21 210 lb (95.3 kg)  06/24/21 210 lb 9.6 oz (95.5 kg)     Body mass index is 38.41 kg/m.   Health Maintenance Due  Topic Date Due   FOOT EXAM  Never done   OPHTHALMOLOGY EXAM  Never done    There are no preventive care reminders to display for this patient.  Lab Results  Component Value Date   TSH 1.15 04/28/2021   Lab Results  Component Value Date   WBC 9.5 07/06/2021   HGB 12.9 07/06/2021   HCT 37.4 07/06/2021   MCV 94.8 07/06/2021   PLT 216.0 07/06/2021   Lab Results  Component Value Date   NA 131 (L) 07/06/2021   K 4.3 07/06/2021   CO2 25 07/06/2021   GLUCOSE 84 07/06/2021   BUN 16 07/06/2021   CREATININE 1.28 (H) 07/06/2021   BILITOT 0.6 07/06/2021   ALKPHOS 72 07/06/2021   AST 9 07/06/2021   ALT 11 07/06/2021   PROT 6.6 07/06/2021   ALBUMIN 4.3 07/06/2021   CALCIUM 9.3 07/06/2021   ANIONGAP 6 04/12/2021   GFR 45.55 (L) 07/06/2021   Lab Results  Component Value Date   CHOL 151 02/10/2021   Lab Results  Component Value Date   HDL 55.20 02/10/2021   Lab Results  Component Value Date   LDLCALC 71 02/10/2021   Lab Results  Component Value Date   TRIG 124.0 02/10/2021   Lab Results  Component Value Date   CHOLHDL 3 02/10/2021   Lab Results  Component Value Date   HGBA1C 5.7 05/19/2021      Assessment & Plan:   Problem List Items Addressed This Visit       Cardiovascular and Mediastinum   Hypertension - Primary   Relevant Medications   carvedilol (COREG) 25 MG tablet     Respiratory   OSA on  CPAP    Repeat sleep study 2023 needs CPAP no supplemental oxygen needed.         Endocrine  Type 2 diabetes mellitus without complication, without long-term current use of insulin ([place])     She continues ozempic as directed without any adverse side effects. She has lost weight. She feels better and A1C is controlled. She denies any hypoglycemia episodes. A1C is now controlled. Diet and exercise, lifestyle changes and also weight loss advised.  Continue Ozempic monitor for hypoglycemia.       Relevant Medications   Semaglutide, 1 MG/DOSE, 4 MG/3ML SOPN     Other   Low sodium levels    She has had low sodium level and was advised to decrease spirolactone to 12.5 mg po qd instead of 25 mg she reports she did not have a pill splitter so she has been taking one tablet 25 mg every other day. She did not do any electrolyte supplements as advised.       Relevant Orders   Comprehensive metabolic panel   Magnesium   BMI 38.0-38.9,adult    Meds ordered this encounter  Medications   carvedilol (COREG) 25 MG tablet    Sig: Take 1 tablet (25 mg total) by mouth 2 (two) times daily with a meal. Monitor heart rate if going below 60 need to notify the office, seek care if afterhours.    Dispense:  60 tablet    Refill:  1   Semaglutide, 1 MG/DOSE, 4 MG/3ML SOPN    Sig: Inject 1 mg as directed once a week.    Dispense:  3 mL    Refill:  1    Orders Placed This Encounter  Procedures   Comprehensive metabolic panel   Magnesium    The patient is advised to begin progressive daily aerobic exercise program, follow a low fat, low cholesterol diet, attempt to lose weight, decrease or avoid alcohol intake, reduce exposure to stress, improve dietary compliance, use calcium 1 gram daily with Vit D, continue current medications, continue current healthy lifestyle patterns, and return for routine annual checkups.    Patient is aware that his primary care provider is leaving the office and last day  will be July 29, 2021 and he will need to establish care with a new primary care provider, list is available upfront for patient to help him with establishing care or they can choose a provider of their choice.Patient verbalized understanding of all instructions given and denies any further questions at this time.   Follow-up: Return in about 1 month (around 08/14/2021), or if symptoms worsen or fail to improve, for at any time for any worsening symptoms, Go to Emergency room/ urgent care if worse.    Marcille Buffy, FNP

## 2021-07-14 NOTE — Progress Notes (Signed)
Please let her know I want her to stop spirolactone completely. She really needs to hydrate with electrolyte solution and will need a recheck CMP scheduled for 07/23/2021 at lab, if any weakness lethargy or new symptoms please be seen immediately. Kidney function is decreased. Avoid NSAID and hydrate with electrolyte solution. This is imperative for a week at least. Monitor blood pressure.

## 2021-07-14 NOTE — Assessment & Plan Note (Signed)
Repeat sleep study 2023 needs CPAP no supplemental oxygen needed.  ?

## 2021-07-14 NOTE — Assessment & Plan Note (Signed)
?  She continues ozempic as directed without any adverse side effects. She has lost weight. She feels better and A1C is controlled. She denies any hypoglycemia episodes. ?A1C is now controlled. Diet and exercise, lifestyle changes and also weight loss advised.  ?Continue Ozempic monitor for hypoglycemia.  ?

## 2021-07-14 NOTE — Telephone Encounter (Signed)
Called and went over ONO results with patient, She voiced understanding. Nothing further needed.  ?

## 2021-07-15 ENCOUNTER — Other Ambulatory Visit: Payer: Self-pay

## 2021-07-15 DIAGNOSIS — I10 Essential (primary) hypertension: Secondary | ICD-10-CM

## 2021-07-15 MED ORDER — CARVEDILOL 25 MG PO TABS: 25.0000 mg | ORAL_TABLET | Freq: Two times a day (BID) | ORAL | 1 refills | Status: DC

## 2021-07-21 ENCOUNTER — Encounter: Payer: Self-pay | Admitting: Psychiatry

## 2021-07-21 ENCOUNTER — Other Ambulatory Visit: Payer: Self-pay

## 2021-07-21 ENCOUNTER — Ambulatory Visit (INDEPENDENT_AMBULATORY_CARE_PROVIDER_SITE_OTHER): Payer: Medicare Other | Admitting: Psychiatry

## 2021-07-21 VITALS — BP 122/64 | HR 66 | Temp 97.6°F | Wt 208.4 lb

## 2021-07-21 DIAGNOSIS — F17200 Nicotine dependence, unspecified, uncomplicated: Secondary | ICD-10-CM

## 2021-07-21 DIAGNOSIS — F3176 Bipolar disorder, in full remission, most recent episode depressed: Secondary | ICD-10-CM

## 2021-07-21 DIAGNOSIS — F1421 Cocaine dependence, in remission: Secondary | ICD-10-CM

## 2021-07-21 DIAGNOSIS — F1021 Alcohol dependence, in remission: Secondary | ICD-10-CM

## 2021-07-21 DIAGNOSIS — Z634 Disappearance and death of family member: Secondary | ICD-10-CM | POA: Diagnosis not present

## 2021-07-21 DIAGNOSIS — F4310 Post-traumatic stress disorder, unspecified: Secondary | ICD-10-CM

## 2021-07-21 MED ORDER — DULOXETINE HCL 30 MG PO CPEP: 30.0000 mg | ORAL_CAPSULE | Freq: Every day | ORAL | 3 refills | Status: DC

## 2021-07-21 MED ORDER — LURASIDONE HCL 40 MG PO TABS: 20.0000 mg | ORAL_TABLET | Freq: Two times a day (BID) | ORAL | 1 refills | Status: DC

## 2021-07-21 NOTE — Progress Notes (Signed)
BH MD/PA/NP OP Progress Note ? ?07/21/2021 12:54 PM ?Susan [name]  ?MRN:  [id number] ? ?Chief Complaint:  ?Chief Complaint  ?Patient presents with  ? Follow-up: 61 year old female with history of PTSD, bipolar disorder, alcoholism in remission, presented for medication management.  ? ?HPI: Susan [name] is a 61 year old Caucasian female, widowed, disabled, lives in [place], has a history of bipolar disorder, alcohol and cocaine use disorder in remission, tobacco use disorder, PTSD, bereavement, degenerative disc disease, obstructive sleep apnea on CPAP, history of total hip replacement, osteoarthritis, hepatitis C was evaluated in office today. ? ?Patient today reports she is currently doing well with her mood.  She however reports she does struggle with fatigue, tiredness during the day and wonders whether her medications could be contributing to that.  Patient reports she is interested in tapering down the Latuda dosage to see if that will help.  She has already reduced the Cymbalta to 30 mg since she had problems controlling her blood pressure.  Currently on 3 different antihypertensives. ? ?Patient reports sleep is good. ? 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  She enjoys her work.  Patient continues to attend AA meetings.  Continues to stay sober from alcohol. ? ?Reports she is interested in smoking cessation, does have nicotine patches available however does not want to take it now since her blood pressure is high. ? ?Denies suicidality, homicidality or perceptual disturbances. ? ?Patient denies any other concerns today. ? ?Visit Diagnosis:  ?  ICD-10-CM   ?1. Bipolar disorder, in full remission, most recent episode depressed (HCC)  F31.76 lurasidone (LATUDA) 40 MG TABS tablet  ?  DULoxetine (CYMBALTA) 30 MG capsule  ?  ?2. PTSD (post-traumatic stress disorder)  F43.10 DULoxetine (CYMBALTA) 30 MG capsule  ?  ?3. Tobacco use disorder   F17.200   ?  ?4. Bereavement  Z63.4   ?  ?5. Alcohol use disorder, severe, in sustained remission ([place])  F10.21   ?  ?6. Cocaine use disorder, moderate, in sustained remission ([place])  F14.21   ?  ? ? ?Past Psychiatric History: Reviewed past psychiatric history from progress note on 09/27/2019.  Past trials of Seroquel, Cymbalta, Abilify, risperidone ? ?Past Medical History:  ?Past Medical History:  ?Diagnosis Date  ? ADD (attention deficit disorder)   ? Alcohol abuse   ? Anemia   ? Anxiety   ? Aortic atherosclerosis ([place]) 02/20/2018  ? Chest CT Sept 2019  ? Arthritis   ? rheumatoid arthritis  ? Asthma   ? Bipolar disorder ([place])   ? Constipation   ? Degenerative disc disease at L5-S1 level 09/28/2016  ? See ortho note May 2018  ? Depression   ? bipolar, hx of suicide attempt  ? Diabetes mellitus, type 2 ([place])   ? Diverticulitis 2013  ? Drug use   ? Dyspnea   ? with exertion  ? GERD (gastroesophageal reflux disease)   ? H/O suicide attempt   ? slit wrists  ? Hepatitis C 06/26/2014  ? Hip pain   ? History of MRSA infection 2013  ? HLD (hyperlipidemia)   ? Hypertension   ? Hypothyroidism   ? Incisional hernia 11/08/2012  ? Knee pain   ? Multinodular thyroid   ? OSA (obstructive sleep apnea)   ? Osteoporosis   ? Post-traumatic osteoarthritis of right knee 09/24/2015  ? Recurrent ventral hernia 11/08/2012  ? Status post total right knee replacement using cement 05/10/2016  ?  Vitamin B12 deficiency   ? Vitamin D deficiency disease   ?  ?Past Surgical History:  ?Procedure Laterality Date  ? BILATERAL SALPINGOOPHORECTOMY    ? due to abnormal mass  ? BREAST BIOPSY Left   ? neg  ? BREAST SURGERY Left 20 yrs ago  ? CESAREAN SECTION    ? COLONOSCOPY    ? COLONOSCOPY WITH PROPOFOL N/A 10/16/2017  ? Procedure: COLONOSCOPY WITH PROPOFOL;  Surgeon: Jonathon Bellows, MD;  Location: [facility] ENDOSCOPY;  Service: Gastroenterology;  Laterality: N/A;  ? COLONOSCOPY WITH PROPOFOL N/A 02/16/2021  ? Procedure: COLONOSCOPY WITH PROPOFOL;  Surgeon: Jonathon Bellows, MD;  Location: [facility] ENDOSCOPY;  Service: Gastroenterology;  Laterality: N/A;  ? HERNIA REPAIR  06/2011, July 2014  ? Ventral wall repair with Physiomesh  ? HERNIA REPAIR    ? 2nd.vental wall repair  ? JOINT REPLACEMENT Right   ? knee  ? TONSILLECTOMY    ? TOTAL HIP ARTHROPLASTY Left 07/23/2019  ? Procedure: TOTAL HIP ARTHROPLASTY;  Surgeon: Corky Mull, MD;  Location: [facility] ORS;  Service: Orthopedics;  Laterality: Left;  ? TOTAL KNEE ARTHROPLASTY Right 05/10/2016  ? Procedure: TOTAL KNEE ARTHROPLASTY;  Surgeon: Corky Mull, MD;  Location: [facility] ORS;  Service: Orthopedics;  Laterality: Right;  ? TUBAL LIGATION    ? ? ?Family Psychiatric History: Reviewed family psychiatric history from progress note on 09/27/2019. ? ?Family History:  ?Family History  ?Problem Relation Age of Onset  ? Arthritis Mother   ? Asthma Mother   ? Mental illness Mother   ? Thyroid disease Mother   ? COPD Mother   ? Heart disease Mother   ? Congestive Heart Failure Mother   ? Alcohol abuse Mother   ? Eating disorder Mother   ? Bipolar disorder Mother   ? Alcohol abuse Father   ? Heart attack Father   ? Alcohol abuse Sister   ? Drug abuse Sister   ? Mental illness Sister   ? Mental illness Sister   ? Fibromyalgia Sister   ? Obesity Sister   ? Pneumonia Sister   ? Depression Sister   ? Mental illness Sister   ? Alcohol abuse Sister   ? Drug abuse Sister   ? Arthritis Brother   ? Mental illness Brother   ? Cancer Brother   ?     non-hodkins lymphoma  ? Drug abuse Daughter   ? Drug abuse Son   ? Alcohol abuse Son   ? Drug abuse Son   ? Alcohol abuse Son   ? Diabetes Neg Hx   ? Stroke Neg Hx   ? Breast cancer Neg Hx   ? Thyroid cancer Neg Hx   ? ? ?Social History: Reviewed social history from progress note from 09/27/2019. ?Social History  ? ?Socioeconomic History  ? Marital status: Widowed  ?  Spouse name: Not on file  ? Number of children: 3  ? Years of education: Not on file  ? Highest education level: Some college, no degree  ?Occupational  History  ? Occupation: disabled  ?Tobacco Use  ? Smoking status: Every Day  ?  Packs/day: 1.50  ?  Years: 41.00  ?  Pack years: 61.50  ?  Types: Cigarettes  ? Smokeless tobacco: Never  ? Tobacco comments:  ?  1ppd 06/24/2021  ?Vaping Use  ? Vaping Use: Former  ? Quit date: 07/15/2017  ?Substance and Sexual Activity  ? Alcohol use: No  ?  Alcohol/week: 0.0 standard drinks  ?  Comment: sober since October 2018  ? Drug use: No  ?  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  ? Sexual activity: Not Currently  ?Other Topics Concern  ? Not on file  ?Social History Narrative  ? Not on file  ? ?Social Determinants of Health  ? ?Financial Resource Strain: Low Risk   ? Difficulty of Paying Living Expenses: Not hard at all  ?Food Insecurity: No Food Insecurity  ? Worried About Charity fundraiser in the Last Year: Never true  ? Ran Out of Food in the Last Year: Never true  ?Transportation Needs: No Transportation Needs  ? Lack of Transportation (Medical): No  ? Lack of Transportation (Non-Medical): No  ?Physical Activity: Sufficiently Active  ? Days of Exercise per Week: 5 days  ? Minutes of Exercise per Session: 30 min  ?Stress: No Stress Concern Present  ? Feeling of Stress : Not at all  ?Social Connections: Moderately Isolated  ? Frequency of Communication with Friends and Family: More than three times a week  ? Frequency of Social Gatherings with Friends and Family: Three times a week  ? Attends Religious Services: Never  ? Active Member of Clubs or Organizations: Yes  ? Attends Archivist Meetings: More than 4 times per year  ? Marital Status: Widowed  ? ? ?Allergies:  ?Allergies  ?Allergen Reactions  ? Wellbutrin [Bupropion]   ?  Patient reports made " deathly sick " years ago at appointment 03/18/20.  ? Lasix [Furosemide] Other (See Comments)  ?  Electrolyte imbalance  ? ? ?Metabolic Disorder Labs: ?Lab Results  ?Component Value Date  ? HGBA1C 5.7 05/19/2021  ? MPG 108 04/08/2019  ? MPG 103 08/16/2017   ? ?No results found for: PROLACTIN ?Lab Results  ?Component Value Date  ? CHOL 151 02/10/2021  ? TRIG 124.0 02/10/2021  ? HDL 55.20 02/10/2021  ? CHOLHDL 3 02/10/2021  ? VLDL 24.8 02/10/2021  ? LDLCALC 71 10/

## 2021-07-23 ENCOUNTER — Other Ambulatory Visit (INDEPENDENT_AMBULATORY_CARE_PROVIDER_SITE_OTHER): Payer: Medicare Other

## 2021-07-23 ENCOUNTER — Other Ambulatory Visit: Payer: Self-pay

## 2021-07-23 DIAGNOSIS — E871 Hypo-osmolality and hyponatremia: Secondary | ICD-10-CM | POA: Diagnosis not present

## 2021-07-23 LAB — COMPREHENSIVE METABOLIC PANEL
ALT: 15 U/L (ref 0–35)
AST: 15 U/L (ref 0–37)
Albumin: 4.4 g/dL (ref 3.5–5.2)
Alkaline Phosphatase: 81 U/L (ref 39–117)
BUN: 18 mg/dL (ref 6–23)
CO2: 24 mEq/L (ref 19–32)
Calcium: 9.4 mg/dL (ref 8.4–10.5)
Chloride: 94 mEq/L — ABNORMAL LOW (ref 96–112)
Creatinine, Ser: 1.32 mg/dL — ABNORMAL HIGH (ref 0.40–1.20)
GFR: 43.89 mL/min — ABNORMAL LOW (ref >60.00)
Glucose, Bld: 102 mg/dL — ABNORMAL HIGH (ref 70–99)
Potassium: 4 mEq/L (ref 3.5–5.1)
Sodium: 126 mEq/L — ABNORMAL LOW (ref 135–145)
Total Bilirubin: 0.4 mg/dL (ref 0.2–1.2)
Total Protein: 6.9 g/dL (ref 6.0–8.3)

## 2021-07-26 NOTE — Progress Notes (Signed)
Her sodium is still low, despite stopping her spirolactone, please verify she did stop it.  ?Given she is 89 I want her go to the ER for replacement sodium.  ?She needs to schedule follow up with her nephrologist as well as cardiology.  ?Follow up here after ER.  ?

## 2021-07-27 ENCOUNTER — Other Ambulatory Visit: Payer: Self-pay | Admitting: Adult Health

## 2021-07-27 DIAGNOSIS — I10 Essential (primary) hypertension: Secondary | ICD-10-CM

## 2021-07-28 ENCOUNTER — Telehealth: Payer: Self-pay

## 2021-07-28 NOTE — Telephone Encounter (Signed)
-----   Message from Doreen Beam, [place] sent at 07/28/2021 11:20 AM EDT ----- ?Does not look like she went for sodium replacement she should be urged to go immediately to the ER.  ?----- Message ----- ?From: Earlyne Iba, CMA ?Sent: 07/27/2021  10:33 AM EDT ?To: Doreen Beam, FNP ? ?I just spoke with patient who was on her way to a meeting. She stated that she will head to the ED afterwards. She confirmed that she stopped the spironolactone around 6 days ago. She does have f/u with cardiology 3/29 but will call for nephology appointment.  ? ?

## 2021-07-28 NOTE — Telephone Encounter (Signed)
LM for pt AND pt daughter - pt needs to go to ER per Sharyn Lull due to low sodium ?

## 2021-07-29 ENCOUNTER — Telehealth: Payer: Self-pay

## 2021-07-29 ENCOUNTER — Ambulatory Visit: Payer: Medicare Other | Admitting: Psychology

## 2021-07-29 NOTE — Telephone Encounter (Signed)
LM FOR PT TO CB  ? ?PT NEEDS TO GO TO ED. ?NO OTHER OPTION. ? ?

## 2021-07-29 NOTE — Telephone Encounter (Signed)
Pt called stating she want to try the sodium pills if she take her BP three times a day  ?

## 2021-07-29 NOTE — Telephone Encounter (Signed)
Pt returning call

## 2021-07-29 NOTE — Telephone Encounter (Signed)
LM FOR PT TO CB - NEEDS TO GO TO ED ?

## 2021-08-04 ENCOUNTER — Encounter: Payer: Self-pay | Admitting: Nurse Practitioner

## 2021-08-04 ENCOUNTER — Ambulatory Visit: Payer: Medicare Other | Admitting: Nurse Practitioner

## 2021-08-04 NOTE — Progress Notes (Deleted)
Office Visit    Patient Name: Susan [name] Date of Encounter: 08/04/2021  Primary Care Provider:  No primary care provider on file. Primary Cardiologist:  Debbe Odea, MD  Chief Complaint    61 year old female with a history of hypertension, hyperlipidemia, tobacco abuse, sleep apnea on CPAP, COPD, obesity, anxiety, hyponatremia, and palpitations, who presents for follow-up related to ***  Past Medical History    Past Medical History:  Diagnosis Date   ADD (attention deficit disorder)    Alcohol abuse    Anemia    Anxiety    Aortic atherosclerosis (HCC) 02/20/2018   Chest CT Sept 2019   Arthritis    rheumatoid arthritis   Asthma    Bipolar disorder (HCC)    Constipation    Degenerative disc disease at L5-S1 level 09/28/2016   See ortho note May 2018   Depression    bipolar, hx of suicide attempt   Diabetes mellitus, type 2 (HCC)    Diverticulitis 2013   DOE (dyspnea on exertion)    Drug use    GERD (gastroesophageal reflux disease)    H/O suicide attempt    slit wrists   Hepatitis C 06/26/2014   Hip pain    History of echocardiogram    a. 04/2019 Echo: EF >65%, nl RV fxn.   History of MRSA infection 2013   HLD (hyperlipidemia)    Hypertension    a. 06/2021 Renal Duplex: ? bilat RAS; b. 06/2021 CTA Abd: No signif RAS.   Hyponatremia    Hypothyroidism    Incisional hernia 11/08/2012   Knee pain    Multinodular thyroid    OSA (obstructive sleep apnea)    Osteoporosis    Palpitations    Post-traumatic osteoarthritis of right knee 09/24/2015   Recurrent ventral hernia 11/08/2012   Status post total right knee replacement using cement 05/10/2016   Vitamin B12 deficiency    Vitamin D deficiency disease    Past Surgical History:  Procedure Laterality Date   BILATERAL SALPINGOOPHORECTOMY     due to abnormal mass   BREAST BIOPSY Left    neg   BREAST SURGERY Left 20 yrs ago   CESAREAN SECTION     COLONOSCOPY     COLONOSCOPY WITH PROPOFOL N/A  10/16/2017   Procedure: COLONOSCOPY WITH PROPOFOL;  Surgeon: Wyline Mood, MD;  Location: [facility] ENDOSCOPY;  Service: Gastroenterology;  Laterality: N/A;   COLONOSCOPY WITH PROPOFOL N/A 02/16/2021   Procedure: COLONOSCOPY WITH PROPOFOL;  Surgeon: Wyline Mood, MD;  Location: [facility] ENDOSCOPY;  Service: Gastroenterology;  Laterality: N/A;   HERNIA REPAIR  06/2011, July 2014   Ventral wall repair with Physiomesh   HERNIA REPAIR     2nd.vental wall repair   JOINT REPLACEMENT Right    knee   TONSILLECTOMY     TOTAL HIP ARTHROPLASTY Left 07/23/2019   Procedure: TOTAL HIP ARTHROPLASTY;  Surgeon: Christena Flake, MD;  Location: [facility] ORS;  Service: Orthopedics;  Laterality: Left;   TOTAL KNEE ARTHROPLASTY Right 05/10/2016   Procedure: TOTAL KNEE ARTHROPLASTY;  Surgeon: Christena Flake, MD;  Location: [facility] ORS;  Service: Orthopedics;  Laterality: Right;   TUBAL LIGATION      Allergies  Allergies  Allergen Reactions   Wellbutrin [Bupropion]     Patient reports made " deathly sick " years ago at appointment 03/18/20.   Lasix [Furosemide] Other (See Comments)    Electrolyte imbalance    History of Present Illness    61 year old female with a  history of hypertension, hyperlipidemia, tobacco abuse, sleep apnea on CPAP, COPD, obesity, anxiety, hyponatremia, and palpitations.  Prior echocardiogram in December 2020 showed an EF of greater than 65% with normal RV function.  She was last seen in cardiology clinic in December 2022, at which time she reported palpitations and heart fluttering, along with elevated home blood pressures (systolics greater than 160).  Blood pressure was elevated in the office and amlodipine 5 mg daily was initiated.  Per primary care note in early January, blood pressures improved slightly-150s at home.  Renal arterial duplex was ordered and suggested bilateral renal artery stenosis.  This was followed by CTA of the abdomen which showed no evidence of renal artery stenosis.  She was  incidentally noted to have multiple low-attenuation lesions of varying density throughout both kidneys as well as an indeterminate density lesion exophytic from the right lower pole measuring up to 7 mm with recommendation for MRI of the abdomen in 3 to 6 months.  She has a history of hyponatremia, and since December, sodiums have been trending in the low 130s.  In late February, she was also noted to have slight rise in creatinine to 1.28 and she was advised to reduce spironolactone to 25 mg daily.  This was subsequently discontinued in the setting of additional rise in creatinine to 1.52 on March 8.  Follow-up labs March 17 showed a sodium of 126, potassium 4.0, chloride 94, BUN of 18, and creatinine of 1.32.  In the setting of hyponatremia, she was encouraged to present to the ED for sodium replacement and advised to follow-up with her nephrologist and cardiology.  Home Medications    Current Outpatient Medications  Medication Sig Dispense Refill   Accu-Chek Softclix Lancets lancets TEST 4 TIMES A DAY 100 each 1   acetaminophen (TYLENOL) 325 MG tablet Take 650 mg by mouth every 6 (six) hours as needed.     albuterol (PROAIR HFA) 108 (90 Base) MCG/ACT inhaler Inhale 2 puffs into the lungs every 6 (six) hours as needed for wheezing or shortness of breath. 1 each 5   amLODipine (NORVASC) 5 MG tablet Take by mouth.     atorvastatin (LIPITOR) 10 MG tablet TAKE 1 TABLET (10 MG TOTAL) BY MOUTH AT BEDTIME. 90 tablet 1   blood glucose meter kit and supplies Dispense based on patient and insurance preference. Use up to four times daily as directed. (FOR ICD-10 E10.9, E11.9). 1 each 0   carvedilol (COREG) 25 MG tablet TAKE 1 TABLET BY MOUTH TWICE A DAY WITH MEAL. MONITOR HEART RATE 60 tablet 1   clindamycin (CLEOCIN T) 1 % external solution Apply to aa's scalp QD PRN 60 mL 3   clotrimazole (LOTRIMIN) 1 % cream Apply 1 application topically 2 (two) times daily as needed.     cyclobenzaprine (FLEXERIL) 5 MG  tablet Take 5 mg by mouth 2 (two) times daily as needed for muscle spasms.      DULoxetine (CYMBALTA) 30 MG capsule Take 1 capsule (30 mg total) by mouth daily. 30 capsule 3   Fluticasone-Umeclidin-Vilant (TRELEGY ELLIPTA) 100-62.5-25 MCG/ACT AEPB      gabapentin (NEURONTIN) 300 MG capsule Take 600 mg by mouth 2 (two) times daily.      glucose blood (ACCU-CHEK GUIDE) test strip TEST UP TO FOUR TIMES A DAY 100 each 0   HYDROcodone-acetaminophen (NORCO/VICODIN) 5-325 MG tablet Take 1 tablet by mouth every 4 (four) hours as needed.     lurasidone (LATUDA) 40 MG TABS tablet Take  0.5 tablets (20 mg total) by mouth 2 (two) times daily with a meal. 30 tablet 1   meloxicam (MOBIC) 15 MG tablet Take 15 mg by mouth daily.     omeprazole (PRILOSEC) 20 MG capsule TAKE 1 CAPSULE BY MOUTH DAILY AS NEEDED TAKE 30 MINUTES BEFORE BREAKFAST 90 capsule 1   OZEMPIC, 1 MG/DOSE, 4 MG/3ML SOPN INJECT 1 MG AS DIRECTED ONCE A WEEK. 3 mL 1   telmisartan (MICARDIS) 80 MG tablet TAKE 1 TABLET (80 MG TOTAL) BY MOUTH DAILY. 30 tablet 2   umeclidinium-vilanterol (ANORO ELLIPTA) 62.5-25 MCG/ACT AEPB Inhale into the lungs.     No current facility-administered medications for this visit.     Review of Systems    ***.  All other systems reviewed and are otherwise negative except as noted above.    Physical Exam    VS:  There were no vitals taken for this visit. , BMI There is no height or weight on file to calculate BMI.     GEN: Well nourished, well developed, in no acute distress. HEENT: normal. Neck: Supple, no JVD, carotid bruits, or masses. Cardiac: RRR, no murmurs, rubs, or gallops. No clubbing, cyanosis, edema.  Radials/DP/PT 2+ and equal bilaterally.  Respiratory:  Respirations regular and unlabored, clear to auscultation bilaterally. GI: Soft, nontender, nondistended, BS + x 4. MS: no deformity or atrophy. Skin: warm and dry, no rash. Neuro:  Strength and sensation are intact. Psych: Normal  affect.  Accessory Clinical Findings    ECG personally reviewed by me today - *** - no acute changes.  Lab Results  Component Value Date   WBC 9.5 07/06/2021   HGB 12.9 07/06/2021   HCT 37.4 07/06/2021   MCV 94.8 07/06/2021   PLT 216.0 07/06/2021   Lab Results  Component Value Date   CREATININE 1.32 (H) 07/23/2021   BUN 18 07/23/2021   NA 126 (L) 07/23/2021   K 4.0 07/23/2021   CL 94 (L) 07/23/2021   CO2 24 07/23/2021   Lab Results  Component Value Date   ALT 15 07/23/2021   AST 15 07/23/2021   ALKPHOS 81 07/23/2021   BILITOT 0.4 07/23/2021   Lab Results  Component Value Date   CHOL 151 02/10/2021   HDL 55.20 02/10/2021   LDLCALC 71 02/10/2021   TRIG 124.0 02/10/2021   CHOLHDL 3 02/10/2021    Lab Results  Component Value Date   HGBA1C 5.7 05/19/2021    Assessment & Plan    1.  ***   Nicolasa Ducking, NP 08/04/2021, 7:40 AM

## 2021-08-05 ENCOUNTER — Encounter: Payer: Self-pay | Admitting: Nurse Practitioner

## 2021-09-01 ENCOUNTER — Encounter: Payer: Self-pay | Admitting: Primary Care

## 2021-09-01 ENCOUNTER — Ambulatory Visit (INDEPENDENT_AMBULATORY_CARE_PROVIDER_SITE_OTHER): Payer: Medicare Other | Admitting: Primary Care

## 2021-09-01 DIAGNOSIS — J432 Centrilobular emphysema: Secondary | ICD-10-CM | POA: Diagnosis not present

## 2021-09-01 DIAGNOSIS — Z9989 Dependence on other enabling machines and devices: Secondary | ICD-10-CM

## 2021-09-01 DIAGNOSIS — G4733 Obstructive sleep apnea (adult) (pediatric): Secondary | ICD-10-CM | POA: Diagnosis not present

## 2021-09-01 NOTE — Progress Notes (Signed)
 @Patient ID: Susan [name], female    DOB: 07/21/1960, 61 y.o.   MRN: [id number]  Chief Complaint  Patient presents with   Follow-up    Wearing cpap 8hr nightly- feels pressure and mask is okay. Pressure and mask is okay.      Referring provider: Flinchum, Michelle S, F*  HPI: 60-year-old female, everyday smoker.  Past medical history significant for OSA on CPAP, centrilobular emphysema, allergic rhinitis, hypertension, aortic arthrosclerosis, GERD, type 2 diabetes, bipolar 1 disorder.  Patient of Dr. Sood, last seen in office on 06/24/2021.  09/01/2021 Patient presents today for 2 to 3-month follow-up. She is compliant with CPAP use, pressure 14 cm H2O without residual events. Overnight oximetry on CPAP on 06/30/2021 showed patient spent 4.8 minutes with SPO2 less than 88%, she does not need supplemental oxygen at night.  She is working on weight loss, down 30 lbs. She is able to walk longer distance without becoming short winded. No acute respiratory complaints. Denies sob, chest tightness, wheezing or cough.   Airview download 07/31/2021 - 08/29/2021 30/30 days used (100%); 26 days (87%) greater than 4 hours Average usage 9 hours 6 minutes Pressure 14 cm H2O Air leaks 2.4 L/min AHI 0.5  Pulmonary function testing 07/08/2021-FVC 2.91 (94%), FEV1 1.74 (73%), ratio 60, TLC 100%, DLCO 13.60 (72%) Mild obstruction, no bronchodilator response.  Normal lung volumes.  Mild diffusion defect.  Allergies  Allergen Reactions   Wellbutrin [Bupropion]     Patient reports made " deathly sick " years ago at appointment 03/18/20.   Lasix [Furosemide] Other (See Comments)    Electrolyte imbalance    Immunization History  Administered Date(s) Administered   Influenza Inj Mdck Quad Pf 02/13/2019   Influenza,inj,Quad PF,6+ Mos 04/09/2015, 01/10/2018, 04/08/2019, 01/27/2020, 02/03/2021   Influenza-Unspecified 05/26/2014, 03/30/2016   PFIZER(Purple Top)SARS-COV-2 Vaccination 08/31/2019,  09/24/2019   PNEUMOCOCCAL CONJUGATE-20 03/24/2021   Pfizer Covid-19 Vaccine Bivalent Booster 12yrs & up 07/01/2020   Pneumococcal Polysaccharide-23 04/09/2015   Tdap 03/09/2012    Past Medical History:  Diagnosis Date   ADD (attention deficit disorder)    Alcohol abuse    Anemia    Anxiety    Aortic atherosclerosis (HCC) 02/20/2018   Chest CT Sept 2019   Arthritis    rheumatoid arthritis   Asthma    Bipolar disorder (HCC)    Constipation    Degenerative disc disease at L5-S1 level 09/28/2016   See ortho note May 2018   Depression    bipolar, hx of suicide attempt   Diabetes mellitus, type 2 (HCC)    Diverticulitis 2013   DOE (dyspnea on exertion)    Drug use    GERD (gastroesophageal reflux disease)    H/O suicide attempt    slit wrists   Hepatitis C 06/26/2014   Hip pain    History of echocardiogram    a. 04/2019 Echo: EF >65%, nl RV fxn.   History of MRSA infection 2013   HLD (hyperlipidemia)    Hypertension    a. 06/2021 Renal Duplex: ? bilat RAS; b. 06/2021 CTA Abd: No signif RAS.   Hyponatremia    Hypothyroidism    Incisional hernia 11/08/2012   Knee pain    Multinodular thyroid    OSA (obstructive sleep apnea)    Osteoporosis    Palpitations    Post-traumatic osteoarthritis of right knee 09/24/2015   Recurrent ventral hernia 11/08/2012   Status post total right knee replacement using cement 05/10/2016   Vitamin B12 deficiency      Vitamin D deficiency disease     Tobacco History: Social History   Tobacco Use  Smoking Status Every Day   Packs/day: 1.50   Years: 41.00   Pack years: 61.50   Types: Cigarettes  Smokeless Tobacco Never  Tobacco Comments   1ppd 09/01/2021   Ready to quit: Not Answered Counseling given: Not Answered Tobacco comments: 1ppd 09/01/2021   Outpatient Medications Prior to Visit  Medication Sig Dispense Refill   Accu-Chek Softclix Lancets lancets TEST 4 TIMES A DAY 100 each 1   acetaminophen (TYLENOL) 325 MG tablet Take 650  mg by mouth every 6 (six) hours as needed.     albuterol (PROAIR HFA) 108 (90 Base) MCG/ACT inhaler Inhale 2 puffs into the lungs every 6 (six) hours as needed for wheezing or shortness of breath. 1 each 5   amLODipine (NORVASC) 5 MG tablet Take by mouth.     atorvastatin (LIPITOR) 10 MG tablet TAKE 1 TABLET (10 MG TOTAL) BY MOUTH AT BEDTIME. 90 tablet 1   blood glucose meter kit and supplies Dispense based on patient and insurance preference. Use up to four times daily as directed. (FOR ICD-10 E10.9, E11.9). 1 each 0   clindamycin (CLEOCIN T) 1 % external solution Apply to aa's scalp QD PRN 60 mL 3   clotrimazole (LOTRIMIN) 1 % cream Apply 1 application topically 2 (two) times daily as needed.     cyclobenzaprine (FLEXERIL) 5 MG tablet Take 5 mg by mouth 2 (two) times daily as needed for muscle spasms.      DULoxetine (CYMBALTA) 30 MG capsule Take 1 capsule (30 mg total) by mouth daily. 30 capsule 3   Fluticasone-Umeclidin-Vilant (TRELEGY ELLIPTA) 100-62.5-25 MCG/ACT AEPB      gabapentin (NEURONTIN) 300 MG capsule Take 600 mg by mouth 2 (two) times daily.      glucose blood (ACCU-CHEK GUIDE) test strip TEST UP TO FOUR TIMES A DAY 100 each 0   HYDROcodone-acetaminophen (NORCO/VICODIN) 5-325 MG tablet Take 1 tablet by mouth every 4 (four) hours as needed.     meloxicam (MOBIC) 15 MG tablet Take 15 mg by mouth daily.     omeprazole (PRILOSEC) 20 MG capsule TAKE 1 CAPSULE BY MOUTH DAILY AS NEEDED TAKE 30 MINUTES BEFORE BREAKFAST 90 capsule 1   OZEMPIC, 1 MG/DOSE, 4 MG/3ML SOPN INJECT 1 MG AS DIRECTED ONCE A WEEK. 3 mL 1   telmisartan (MICARDIS) 80 MG tablet TAKE 1 TABLET (80 MG TOTAL) BY MOUTH DAILY. 30 tablet 2   carvedilol (COREG) 25 MG tablet TAKE 1 TABLET BY MOUTH TWICE A DAY WITH MEAL. MONITOR HEART RATE 60 tablet 1   lurasidone (LATUDA) 40 MG TABS tablet Take 0.5 tablets (20 mg total) by mouth 2 (two) times daily with a meal. 30 tablet 1   umeclidinium-vilanterol (ANORO ELLIPTA) 62.5-25 MCG/ACT  AEPB Inhale into the lungs.     No facility-administered medications prior to visit.      Review of Systems  Review of Systems  Constitutional: Negative.   HENT: Negative.    Respiratory: Negative.      Physical Exam  BP 132/74 (BP Location: Left Arm, Cuff Size: Large)   Pulse 68   Temp 97.7 F (36.5 C) (Temporal)   Ht 5' 2" (1.575 m)   Wt 212 lb (96.2 kg)   SpO2 96%   BMI 38.78 kg/m  Physical Exam Constitutional:      Appearance: Normal appearance.  HENT:     Head: Normocephalic and atraumatic.  Mouth/Throat:     Mouth: Mucous membranes are moist.     Pharynx: Oropharynx is clear.  Cardiovascular:     Rate and Rhythm: Normal rate and regular rhythm.  Pulmonary:     Effort: Pulmonary effort is normal.     Breath sounds: Normal breath sounds.  Skin:    General: Skin is warm and dry.  Neurological:     General: No focal deficit present.     Mental Status: She is alert and oriented to person, place, and time. Mental status is at baseline.  Psychiatric:        Mood and Affect: Mood normal.        Behavior: Behavior normal.        Thought Content: Thought content normal.        Judgment: Judgment normal.     Lab Results:  CBC    Component Value Date/Time   WBC 9.5 07/06/2021 0809   RBC 3.94 07/06/2021 0809   HGB 12.9 07/06/2021 0809   HGB 13.2 05/28/2020 0903   HCT 37.4 07/06/2021 0809   HCT 38.7 05/28/2020 0903   PLT 216.0 07/06/2021 0809   PLT 255 05/28/2020 0903   MCV 94.8 07/06/2021 0809   MCV 93 05/28/2020 0903   MCV 91 04/29/2014 1133   MCH 33.0 04/12/2021 1655   MCHC 34.6 07/06/2021 0809   RDW 13.1 07/06/2021 0809   RDW 12.2 05/28/2020 0903   RDW 13.1 04/29/2014 1133   LYMPHSABS 1.7 07/06/2021 0809   LYMPHSABS 3.3 (H) 05/28/2020 0903   LYMPHSABS 2.3 11/20/2012 1005   MONOABS 0.7 07/06/2021 0809   MONOABS 0.5 11/20/2012 1005   EOSABS 0.2 07/06/2021 0809   EOSABS 0.3 05/28/2020 0903   EOSABS 0.2 11/20/2012 1005   BASOSABS 0.1  07/06/2021 0809   BASOSABS 0.1 05/28/2020 0903   BASOSABS 0.1 11/20/2012 1005    BMET    Component Value Date/Time   NA 126 (L) 07/23/2021 1024   NA 137 05/28/2020 0903   NA 136 04/29/2014 1133   K 4.0 07/23/2021 1024   K 4.2 04/29/2014 1133   CL 94 (L) 07/23/2021 1024   CL 103 04/29/2014 1133   CO2 24 07/23/2021 1024   CO2 30 04/29/2014 1133   GLUCOSE 102 (H) 07/23/2021 1024   GLUCOSE 85 04/29/2014 1133   BUN 18 07/23/2021 1024   BUN 9 05/28/2020 0903   BUN 9 04/29/2014 1133   CREATININE 1.32 (H) 07/23/2021 1024   CREATININE 0.92 10/16/2019 1139   CALCIUM 9.4 07/23/2021 1024   CALCIUM 9.1 04/29/2014 1133   GFRNONAA >60 04/12/2021 1655   GFRNONAA 69 10/16/2019 1139   GFRAA 64 05/28/2020 0903   GFRAA 80 10/16/2019 1139    BNP    Component Value Date/Time   BNP 24 10/16/2019 1139    ProBNP No results found for: PROBNP  Imaging: No results found.   Assessment & Plan:   OSA on CPAP - Well controlled on CPAP. Airview download between 07/31/21-08/29/21 showed 87% compliance with CPAP use > 4 hours. Pressure 14cm h20 with AHI 0.4/hr. No changes needed today. No need to repeat sleep study. Continue nightly use.   Centrilobular emphysema (HCC) - Seen on imaging in 2019. PFTs in March 2023 showed mild-moderate obstructive lung disease consistent with COPD. Dyspnea symptoms improved with weight loss. Continue to use Trelegy 100mcg one puff daily in the morning and albuterol every 6 hours as needed. Strongly encourage smoking cessation. FU in 6 months with Dr. Sood   or sooner if needed.     Martyn Ehrich, NP 10/04/2021

## 2021-09-01 NOTE — Patient Instructions (Addendum)
PFTs showed mild-moderate obstructive lung disease consistent with COPD ?Strongly encourage you quit smoking  ? ?Recommendations: ?- Continue to use Trelegy 1 puff daily in the morning and albuterol every 6 hours as needed ?- I do not see any reason you need sleep study, your PCP ordered this back in February- they will need to cancel as [facility] did not order.  ?- Your compliance looks great, no issues with current pressure settings. You do not need supplemental oxygen . Continue to wear CPAP every night ? ?Follow-up: ?- 6 months with Dr. Halford Chessman  ? ? ?

## 2021-09-03 ENCOUNTER — Ambulatory Visit: Payer: Medicare Other

## 2021-09-07 ENCOUNTER — Encounter: Payer: Self-pay | Admitting: Family Medicine

## 2021-09-07 ENCOUNTER — Ambulatory Visit (INDEPENDENT_AMBULATORY_CARE_PROVIDER_SITE_OTHER): Payer: Medicare Other | Admitting: Family Medicine

## 2021-09-07 VITALS — BP 125/70 | HR 72 | Temp 97.7°F | Ht 62.0 in | Wt 210.0 lb

## 2021-09-07 DIAGNOSIS — R350 Frequency of micturition: Secondary | ICD-10-CM | POA: Diagnosis not present

## 2021-09-07 LAB — URINALYSIS, MICROSCOPIC ONLY

## 2021-09-07 LAB — POCT URINALYSIS DIPSTICK
Bilirubin, UA: NEGATIVE
Glucose, UA: NEGATIVE
Nitrite, UA: NEGATIVE
Protein, UA: POSITIVE — AB
Spec Grav, UA: 1.01 (ref 1.010–1.025)
Urobilinogen, UA: 2 E.U./dL — AB
pH, UA: 6.5 (ref 5.0–8.0)

## 2021-09-07 MED ORDER — NITROFURANTOIN MONOHYD MACRO 100 MG PO CAPS: 100.0000 mg | ORAL_CAPSULE | Freq: Two times a day (BID) | ORAL | 0 refills | Status: DC

## 2021-09-07 NOTE — Progress Notes (Signed)
?Tommi Rumps, MD ?Phone: [phone] ? ?Susan [name] is a 61 y.o. female who presents today for same day visit.  ? ?UTI: started this morning. Has had some external genital itching that she has been scratching.  ?Dysuria- yes  ?Frequency- yes   ?Urgency- yes   ?Hematuria- no   ?Abd pain- no   ?Vaginal d/c- no ? ? ?Social History  ? ?Tobacco Use  ?Smoking Status Every Day  ? Packs/day: 1.50  ? Years: 41.00  ? Pack years: 61.50  ? Types: Cigarettes  ?Smokeless Tobacco Never  ?Tobacco Comments  ? 1ppd 09/01/2021  ? ? ?Current Outpatient Medications on File Prior to Visit  ?Medication Sig Dispense Refill  ? Accu-Chek Softclix Lancets lancets TEST 4 TIMES A DAY 100 each 1  ? acetaminophen (TYLENOL) 325 MG tablet Take 650 mg by mouth every 6 (six) hours as needed.    ? albuterol (PROAIR HFA) 108 (90 Base) MCG/ACT inhaler Inhale 2 puffs into the lungs every 6 (six) hours as needed for wheezing or shortness of breath. 1 each 5  ? amLODipine (NORVASC) 5 MG tablet Take by mouth.    ? atorvastatin (LIPITOR) 10 MG tablet TAKE 1 TABLET (10 MG TOTAL) BY MOUTH AT BEDTIME. 90 tablet 1  ? blood glucose meter kit and supplies Dispense based on patient and insurance preference. Use up to four times daily as directed. (FOR ICD-10 E10.9, E11.9). 1 each 0  ? carvedilol (COREG) 25 MG tablet TAKE 1 TABLET BY MOUTH TWICE A DAY WITH MEAL. MONITOR HEART RATE 60 tablet 1  ? clindamycin (CLEOCIN T) 1 % external solution Apply to aa's scalp QD PRN 60 mL 3  ? clotrimazole (LOTRIMIN) 1 % cream Apply 1 application topically 2 (two) times daily as needed.    ? cyclobenzaprine (FLEXERIL) 5 MG tablet Take 5 mg by mouth 2 (two) times daily as needed for muscle spasms.     ? DULoxetine (CYMBALTA) 30 MG capsule Take 1 capsule (30 mg total) by mouth daily. 30 capsule 3  ? Fluticasone-Umeclidin-Vilant (TRELEGY ELLIPTA) 100-62.5-25 MCG/ACT AEPB     ? gabapentin (NEURONTIN) 300 MG capsule Take 600 mg by mouth 2 (two) times daily.     ? glucose blood  (ACCU-CHEK GUIDE) test strip TEST UP TO FOUR TIMES A DAY 100 each 0  ? HYDROcodone-acetaminophen (NORCO/VICODIN) 5-325 MG tablet Take 1 tablet by mouth every 4 (four) hours as needed.    ? lurasidone (LATUDA) 40 MG TABS tablet Take 0.5 tablets (20 mg total) by mouth 2 (two) times daily with a meal. 30 tablet 1  ? meloxicam (MOBIC) 15 MG tablet Take 15 mg by mouth daily.    ? omeprazole (PRILOSEC) 20 MG capsule TAKE 1 CAPSULE BY MOUTH DAILY AS NEEDED TAKE 30 MINUTES BEFORE BREAKFAST 90 capsule 1  ? OZEMPIC, 1 MG/DOSE, 4 MG/3ML SOPN INJECT 1 MG AS DIRECTED ONCE A WEEK. 3 mL 1  ? telmisartan (MICARDIS) 80 MG tablet TAKE 1 TABLET (80 MG TOTAL) BY MOUTH DAILY. 30 tablet 2  ? ?No current facility-administered medications on file prior to visit.  ? ? ? ?ROS see history of present illness ? ?Objective ? ?Physical Exam ?Vitals:  ? 09/07/21 1314  ?BP: 125/70  ?Pulse: 72  ?Temp: 97.7 ?F (36.5 ?C)  ?SpO2: 96%  ? ? ?BP Readings from Last 3 Encounters:  ?09/07/21 125/70  ?09/01/21 132/74  ?07/14/21 136/80  ? ?Wt Readings from Last 3 Encounters:  ?09/07/21 210 lb (95.3 kg)  ?09/01/21  212 lb (96.2 kg)  ?07/14/21 210 lb (95.3 kg)  ? ? ?Physical Exam ?Constitutional:   ?   General: She is not in acute distress. ?Abdominal:  ?   General: Bowel sounds are normal. There is no distension.  ?   Palpations: Abdomen is soft.  ?   Tenderness: There is no abdominal tenderness.  ?Neurological:  ?   Mental Status: She is alert.  ? ? ? ?Assessment/Plan: Please see individual problem list. ? ?Problem List Items Addressed This Visit   ? ? Urine frequency - Primary  ?  Patient symptoms and urinalysis are concerning for UTI.  We will treat with Macrobid.  We will send urine for culture and microscopy.  I did offer her to look at her external genitalia to evaluate for cause of her itching though she declined that today.  I advised that she should be evaluated if that persist despite treatment for her UTI.  She was advised to seek medical attention if  her symptoms were not improving. ? ?  ?  ? Relevant Medications  ? nitrofurantoin, macrocrystal-monohydrate, (MACROBID) 100 MG capsule  ? Other Relevant Orders  ? Urine Microscopic  ? Urine Culture  ? POCT Urinalysis Dipstick (Completed)  ? ? ?Return for Patient needs to set up a visit with new PCP.. ? ?This visit occurred during the SARS-CoV-2 public health emergency.  Safety protocols were in place, including screening questions prior to the visit, additional usage of staff PPE, and extensive cleaning of exam room while observing appropriate contact time as indicated for disinfecting solutions.  ? ? ?Tommi Rumps, MD ?[place] ? ?

## 2021-09-07 NOTE — Assessment & Plan Note (Addendum)
Patient symptoms and urinalysis are concerning for UTI.  We will treat with Macrobid.  We will send urine for culture and microscopy.  I did offer her to look at her external genitalia to evaluate for cause of her itching though she declined that today.  I advised that she should be evaluated if that persist despite treatment for her UTI.  She was advised to seek medical attention if her symptoms were not improving. ?

## 2021-09-07 NOTE — Patient Instructions (Signed)
Nice to see you. ?I sent Macrobid in for you to start on for your UTI. ?We will contact you with your urine culture results. ?If your symptoms are not improving you need to be reevaluated.  If your symptoms worsen you should be evaluated again. ?If the itching does not resolve with treatment of your UTI you need to have that evaluated as well. ?

## 2021-09-09 ENCOUNTER — Other Ambulatory Visit: Payer: Self-pay | Admitting: Family Medicine

## 2021-09-09 DIAGNOSIS — N3001 Acute cystitis with hematuria: Secondary | ICD-10-CM

## 2021-09-09 LAB — URINE CULTURE
MICRO NUMBER:: 13340064
SPECIMEN QUALITY:: ADEQUATE

## 2021-09-21 ENCOUNTER — Telehealth: Payer: Medicare Other | Admitting: Psychiatry

## 2021-09-22 ENCOUNTER — Other Ambulatory Visit: Payer: Self-pay

## 2021-09-22 DIAGNOSIS — I10 Essential (primary) hypertension: Secondary | ICD-10-CM

## 2021-09-22 MED ORDER — CARVEDILOL 25 MG PO TABS: ORAL_TABLET | ORAL | 1 refills | Status: DC

## 2021-09-29 ENCOUNTER — Telehealth: Payer: Self-pay

## 2021-09-29 DIAGNOSIS — F3176 Bipolar disorder, in full remission, most recent episode depressed: Secondary | ICD-10-CM

## 2021-09-29 MED ORDER — LURASIDONE HCL 40 MG PO TABS: 20.0000 mg | ORAL_TABLET | Freq: Two times a day (BID) | ORAL | 1 refills | Status: DC

## 2021-09-29 NOTE — Telephone Encounter (Signed)
received fax requesting a refill on the latuda '40mg'$ 

## 2021-09-29 NOTE — Telephone Encounter (Signed)
I have sent Latuda to pharmacy.

## 2021-10-04 NOTE — Assessment & Plan Note (Signed)
-   Well controlled on CPAP. Airview download between 07/31/21-08/29/21 showed 87% compliance with CPAP use > 4 hours. Pressure 14cm h20 with AHI 0.4/hr. No changes needed today. No need to repeat sleep study. Continue nightly use.

## 2021-10-04 NOTE — Assessment & Plan Note (Addendum)
-   Seen on imaging in 2019. PFTs in March 2023 showed mild-moderate obstructive lung disease consistent with COPD. Dyspnea symptoms improved with weight loss. Continue to use Trelegy 158mg one puff daily in the morning and albuterol every 6 hours as needed. Strongly encourage smoking cessation. FU in 6 months with Dr. SHalford Chessmanor sooner if needed.

## 2021-10-05 ENCOUNTER — Telehealth: Payer: Self-pay | Admitting: Family Medicine

## 2021-10-05 ENCOUNTER — Other Ambulatory Visit (INDEPENDENT_AMBULATORY_CARE_PROVIDER_SITE_OTHER): Payer: Medicare Other

## 2021-10-05 DIAGNOSIS — N3001 Acute cystitis with hematuria: Secondary | ICD-10-CM

## 2021-10-05 LAB — POCT URINALYSIS DIPSTICK
Bilirubin, UA: NEGATIVE
Glucose, UA: NEGATIVE
Ketones, UA: NEGATIVE
Nitrite, UA: NEGATIVE
Protein, UA: NEGATIVE
Spec Grav, UA: <=1.005 — AB (ref 1.010–1.025)
Urobilinogen, UA: 0.2 E.U./dL
pH, UA: 6 (ref 5.0–8.0)

## 2021-10-05 NOTE — Telephone Encounter (Signed)
Pt called in requesting a refill on her antibiotic that Dr. Caryl Bis had prescribe to her... Pt stated that she was finish with her antibiotic and then her bladder infection came right back... Pt requesting callback.Marland KitchenMarland Kitchen

## 2021-10-05 NOTE — Addendum Note (Signed)
Addended by: Leeanne Rio on: 10/05/2021 04:36 PM   Modules accepted: Orders

## 2021-10-05 NOTE — Telephone Encounter (Signed)
LVM for patient to call back to informed her to keep her lab appointment and give a urine sample, she cannot have another  antibiotic  untic that is done.  Susan [name],cma

## 2021-10-06 LAB — URINALYSIS, MICROSCOPIC ONLY

## 2021-10-06 NOTE — Progress Notes (Signed)
Reviewed and agree with assessment/plan.   Taronda Comacho, MD [facility] 10/06/2021, 12:50 PM Pager:  [phone]  

## 2021-10-06 NOTE — Telephone Encounter (Signed)
Patient was scheduled for a visit.  Sheliah Fiorillo,cma

## 2021-10-07 ENCOUNTER — Other Ambulatory Visit: Payer: Self-pay

## 2021-10-07 ENCOUNTER — Telehealth: Payer: Self-pay | Admitting: Family Medicine

## 2021-10-07 DIAGNOSIS — I10 Essential (primary) hypertension: Secondary | ICD-10-CM

## 2021-10-07 LAB — URINE CULTURE
MICRO NUMBER:: 13458448
Result:: NO GROWTH
SPECIMEN QUALITY:: ADEQUATE

## 2021-10-07 MED ORDER — CARVEDILOL 25 MG PO TABS: ORAL_TABLET | ORAL | 1 refills | Status: DC

## 2021-10-07 NOTE — Telephone Encounter (Signed)
Called and spoke with the patient, see result note.  Susan [name],cma

## 2021-10-07 NOTE — Telephone Encounter (Signed)
Patient called office and the following was read; Please let the patient know that she still has some red and white blood cells in her urine. Is she still having urine symptoms?   Patient states she is still having pain when urinating, and pressure that she has to go all the time.

## 2021-10-07 NOTE — Telephone Encounter (Signed)
Pt called back and I read message to pt and she stated that she is ok with being referred to urology

## 2021-10-07 NOTE — Telephone Encounter (Signed)
LVM for patient to call back.   Sari Cogan,cma  

## 2021-10-08 ENCOUNTER — Other Ambulatory Visit: Payer: Self-pay | Admitting: Family Medicine

## 2021-10-08 DIAGNOSIS — R3129 Other microscopic hematuria: Secondary | ICD-10-CM

## 2021-10-11 ENCOUNTER — Other Ambulatory Visit: Payer: Medicare Other

## 2021-10-14 ENCOUNTER — Encounter: Payer: Self-pay | Admitting: *Deleted

## 2021-10-15 ENCOUNTER — Other Ambulatory Visit: Payer: Self-pay | Admitting: Psychiatry

## 2021-10-15 DIAGNOSIS — F3289 Other specified depressive episodes: Secondary | ICD-10-CM

## 2021-10-21 ENCOUNTER — Encounter: Payer: Self-pay | Admitting: Psychiatry

## 2021-10-21 ENCOUNTER — Ambulatory Visit (INDEPENDENT_AMBULATORY_CARE_PROVIDER_SITE_OTHER): Payer: Medicare Other | Admitting: Urology

## 2021-10-21 ENCOUNTER — Encounter: Payer: Self-pay | Admitting: Urology

## 2021-10-21 ENCOUNTER — Telehealth (INDEPENDENT_AMBULATORY_CARE_PROVIDER_SITE_OTHER): Payer: Medicare Other | Admitting: Psychiatry

## 2021-10-21 VITALS — BP 162/77 | HR 75 | Ht 62.0 in | Wt 210.0 lb

## 2021-10-21 DIAGNOSIS — F1021 Alcohol dependence, in remission: Secondary | ICD-10-CM

## 2021-10-21 DIAGNOSIS — F4310 Post-traumatic stress disorder, unspecified: Secondary | ICD-10-CM

## 2021-10-21 DIAGNOSIS — F17200 Nicotine dependence, unspecified, uncomplicated: Secondary | ICD-10-CM | POA: Diagnosis not present

## 2021-10-21 DIAGNOSIS — R3129 Other microscopic hematuria: Secondary | ICD-10-CM | POA: Diagnosis not present

## 2021-10-21 DIAGNOSIS — N2889 Other specified disorders of kidney and ureter: Secondary | ICD-10-CM

## 2021-10-21 DIAGNOSIS — N3941 Urge incontinence: Secondary | ICD-10-CM | POA: Diagnosis not present

## 2021-10-21 DIAGNOSIS — F3131 Bipolar disorder, current episode depressed, mild: Secondary | ICD-10-CM | POA: Diagnosis not present

## 2021-10-21 DIAGNOSIS — F1421 Cocaine dependence, in remission: Secondary | ICD-10-CM

## 2021-10-21 DIAGNOSIS — Z634 Disappearance and death of family member: Secondary | ICD-10-CM | POA: Diagnosis not present

## 2021-10-21 DIAGNOSIS — R3915 Urgency of urination: Secondary | ICD-10-CM

## 2021-10-21 LAB — MICROSCOPIC EXAMINATION: Bacteria, UA: NONE SEEN

## 2021-10-21 LAB — URINALYSIS, COMPLETE
Bilirubin, UA: NEGATIVE
Glucose, UA: NEGATIVE
Ketones, UA: NEGATIVE
Leukocytes,UA: NEGATIVE
Nitrite, UA: NEGATIVE
Protein,UA: NEGATIVE
Specific Gravity, UA: 1.01 (ref 1.005–1.030)
Urobilinogen, Ur: 1 mg/dL (ref 0.2–1.0)
pH, UA: 7 (ref 5.0–7.5)

## 2021-10-21 MED ORDER — OXYBUTYNIN CHLORIDE ER 10 MG PO TB24: 10.0000 mg | ORAL_TABLET | Freq: Every day | ORAL | 11 refills | Status: DC

## 2021-10-21 MED ORDER — DULOXETINE HCL 20 MG PO CPEP: 20.0000 mg | ORAL_CAPSULE | Freq: Every day | ORAL | 1 refills | Status: DC

## 2021-10-21 MED ORDER — DULOXETINE HCL 30 MG PO CPEP: 30.0000 mg | ORAL_CAPSULE | Freq: Every day | ORAL | 1 refills | Status: DC

## 2021-10-21 NOTE — Progress Notes (Signed)
10/21/21 2:24 PM   Susan [name] 09-Jun-1960 [id number]  Referring provider:  Leone Haven, MD [address] [place],  [place] [address] Chief Complaint  Patient presents with   Hematuria     HPI: Susan [name] is a 61 y.o.female with a personal history of ho presents today for further evaluation of microscopic hematuria.   She underwent a renal artery dupex ultrasound on 06/11/2021 visualized elevated renal artery velocities, at the RIGHT mid and at the origin of LEFT renal artery. Findings are suspicious for bilateral renal artery stenosis, and a 3.8 cm LEFT exophytic renal cyst.  To further evaluate renal arter stenoisis she underwent a CTA abdomen and pelvis on 06/15/2021 Multiple low-attenuation lesions of varying density throughout both kidneys. And indeterminate density lesion exophytic from the right lower pole measures up to 7 mm in diameter and may represent a complex cyst versus a small renal neoplasm.   She was seen by her PCP , Dr Tommi Rumps, on 09/07/2021. She was noted to have external genital itching with UTI symptoms. Urinalysis showed moderate Leukocytes >50 WBCs, 3-6, RBCs and few bacteria. Urine culture grew E.coli. She was treated with Macrobid.   She developed recurrent urinary tract symptoms.  She had a repeat urinalysis on 10/05/2021 that showed >50 WBCs, 3-6 RBCs, few squamous epithelial cells and rare bacteria. POCT urinalysis dip showed large blood, and large leukocytes. She was recommended to return back here for evaluation of hematuria.    All of her urinary symptoms have now completely resolved.  No dysuria or gross hematuria.  She does mention today that she was then struggling with urinary urgency and occasional urge incontinence for many years.  She is never tried any medications for this.  It is bothersome.  It is at her baseline currently.  UA is unremarkable.   PMH: Past Medical History:  Diagnosis Date   ADD (attention  deficit disorder)    Alcohol abuse    Anemia    Anxiety    Aortic atherosclerosis ([place]) 02/20/2018   Chest CT Sept 2019   Arthritis    rheumatoid arthritis   Asthma    Bipolar disorder ([place])    Constipation    Degenerative disc disease at L5-S1 level 09/28/2016   See ortho note May 2018   Depression    bipolar, hx of suicide attempt   Diabetes mellitus, type 2 ([place])    Diverticulitis 2013   DOE (dyspnea on exertion)    Drug use    GERD (gastroesophageal reflux disease)    H/O suicide attempt    slit wrists   Hepatitis C 06/26/2014   Hip pain    History of echocardiogram    a. 04/2019 Echo: EF >65%, nl RV fxn.   History of MRSA infection 2013   HLD (hyperlipidemia)    Hypertension    a. 06/2021 Renal Duplex: ? bilat RAS; b. 06/2021 CTA Abd: No signif RAS.   Hyponatremia    Hypothyroidism    Incisional hernia 11/08/2012   Knee pain    Multinodular thyroid    OSA (obstructive sleep apnea)    Osteoporosis    Palpitations    Post-traumatic osteoarthritis of right knee 09/24/2015   Recurrent ventral hernia 11/08/2012   Status post total right knee replacement using cement 05/10/2016   Vitamin B12 deficiency    Vitamin D deficiency disease     Surgical History: Past Surgical History:  Procedure Laterality Date   BILATERAL SALPINGOOPHORECTOMY  due to abnormal mass   BREAST BIOPSY Left    neg   BREAST SURGERY Left 20 yrs ago   CESAREAN SECTION     COLONOSCOPY     COLONOSCOPY WITH PROPOFOL N/A 10/16/2017   Procedure: COLONOSCOPY WITH PROPOFOL;  Surgeon: Jonathon Bellows, MD;  Location[facility] ENDOSCOPY;  Service: Gastroenterology;  Laterality: N/A;   COLONOSCOPY WITH PROPOFOL N/A 02/16/2021   Procedure: COLONOSCOPY WITH PROPOFOL;  Surgeon: Jonathon Bellows, MD;  Location[facility] ENDOSCOPY;  Service: Gastroenterology;  Laterality: N/A;   HERNIA REPAIR  06/2011, July 2014   Ventral wall repair with Physiomesh   HERNIA REPAIR     2nd.vental wall repair   JOINT REPLACEMENT Right     knee   TONSILLECTOMY     TOTAL HIP ARTHROPLASTY Left 07/23/2019   Procedure: TOTAL HIP ARTHROPLASTY;  Surgeon: Corky Mull, MD;  Location: [facility] ORS;  Service: Orthopedics;  Laterality: Left;   TOTAL KNEE ARTHROPLASTY Right 05/10/2016   Procedure: TOTAL KNEE ARTHROPLASTY;  Surgeon: Corky Mull, MD;  Location: [facility] ORS;  Service: Orthopedics;  Laterality: Right;   TUBAL LIGATION      Home Medications:  Allergies as of 10/21/2021       Reactions   Wellbutrin [bupropion]    Patient reports made " deathly sick " years ago at appointment 03/18/20.   Lasix [furosemide] Other (See Comments)   Electrolyte imbalance        Medication List        Accurate as of October 21, 2021  2:24 PM. If you have any questions, ask your nurse or doctor.          STOP taking these medications    amLODipine 5 MG tablet Commonly known as: NORVASC Stopped by: Ursula Alert, MD   atorvastatin 10 MG tablet Commonly known as: LIPITOR Stopped by: Ursula Alert, MD   nitrofurantoin (macrocrystal-monohydrate) 100 MG capsule Commonly known as: Macrobid Stopped by: Hollice Espy, MD       TAKE these medications    Accu-Chek Guide test strip Generic drug: glucose blood TEST UP TO FOUR TIMES A DAY   Accu-Chek Softclix Lancets lancets TEST 4 TIMES A DAY   acetaminophen 325 MG tablet Commonly known as: TYLENOL Take 650 mg by mouth every 6 (six) hours as needed.   albuterol 108 (90 Base) MCG/ACT inhaler Commonly known as: ProAir HFA Inhale 2 puffs into the lungs every 6 (six) hours as needed for wheezing or shortness of breath.   blood glucose meter kit and supplies Dispense based on patient and insurance preference. Use up to four times daily as directed. (FOR ICD-10 E10.9, E11.9).   carvedilol 25 MG tablet Commonly known as: COREG TAKE 1 TABLET BY MOUTH TWICE A DAY WITH MEAL. MONITOR HEART RATE   clindamycin 1 % external solution Commonly known as: CLEOCIN T Apply to aa's scalp QD  PRN   clotrimazole 1 % cream Commonly known as: LOTRIMIN Apply 1 application topically 2 (two) times daily as needed.   cyclobenzaprine 5 MG tablet Commonly known as: FLEXERIL Take 5 mg by mouth 2 (two) times daily as needed for muscle spasms.   DULoxetine 30 MG capsule Commonly known as: Cymbalta Take 1 capsule (30 mg total) by mouth daily. Take along with 20 mg daily What changed: additional instructions Changed by: Ursula Alert, MD   DULoxetine 20 MG capsule Commonly known as: Cymbalta Take 1 capsule (20 mg total) by mouth daily. Take along with 30 mg daily What changed: You  were already taking a medication with the same name, and this prescription was added. Make sure you understand how and when to take each. Changed by: Ursula Alert, MD   gabapentin 300 MG capsule Commonly known as: NEURONTIN Take 600 mg by mouth 2 (two) times daily.   HYDROcodone-acetaminophen 5-325 MG tablet Commonly known as: NORCO/VICODIN Take 1 tablet by mouth 3 (three) times daily as needed for moderate pain. What changed: Another medication with the same name was removed. Continue taking this medication, and follow the directions you see here. Changed by: Hollice Espy, MD   lurasidone 40 MG Tabs tablet Commonly known as: Latuda Take 0.5 tablets (20 mg total) by mouth 2 (two) times daily with a meal.   meloxicam 15 MG tablet Commonly known as: MOBIC Take 15 mg by mouth daily.   omeprazole 20 MG capsule Commonly known as: PRILOSEC TAKE 1 CAPSULE BY MOUTH DAILY AS NEEDED TAKE 30 MINUTES BEFORE BREAKFAST   oxybutynin 10 MG 24 hr tablet Commonly known as: DITROPAN-XL Take 1 tablet (10 mg total) by mouth daily. Started by: Hollice Espy, MD   Ozempic (1 MG/DOSE) 4 MG/3ML Sopn Generic drug: Semaglutide (1 MG/DOSE) INJECT 1 MG AS DIRECTED ONCE A WEEK.   telmisartan 80 MG tablet Commonly known as: MICARDIS TAKE 1 TABLET (80 MG TOTAL) BY MOUTH DAILY.   Trelegy Ellipta 100-62.5-25  MCG/ACT Aepb Generic drug: Fluticasone-Umeclidin-Vilant        Allergies:  Allergies  Allergen Reactions   Wellbutrin [Bupropion]     Patient reports made " deathly sick " years ago at appointment 03/18/20.   Lasix [Furosemide] Other (See Comments)    Electrolyte imbalance    Family History: Family History  Problem Relation Age of Onset   Arthritis Mother    Asthma Mother    Mental illness Mother    Thyroid disease Mother    COPD Mother    Heart disease Mother    Congestive Heart Failure Mother    Alcohol abuse Mother    Eating disorder Mother    Bipolar disorder Mother    Alcohol abuse Father    Heart attack Father    Alcohol abuse Sister    Drug abuse Sister    Mental illness Sister    Mental illness Sister    Fibromyalgia Sister    Obesity Sister    Pneumonia Sister    Depression Sister    Mental illness Sister    Alcohol abuse Sister    Drug abuse Sister    Arthritis Brother    Mental illness Brother    Cancer Brother        non-hodkins lymphoma   Drug abuse Daughter    Drug abuse Son    Alcohol abuse Son    Drug abuse Son    Alcohol abuse Son    Diabetes Neg Hx    Stroke Neg Hx    Breast cancer Neg Hx    Thyroid cancer Neg Hx     Social History:  reports that she has been smoking cigarettes. She has a 61.50 pack-year smoking history. She has never used smokeless tobacco. She reports that she does not drink alcohol and does not use drugs.   Physical Exam: BP (!) 162/77   Pulse 75   Ht '5\' 2"'  (1.575 m)   Wt 210 lb (95.3 kg)   BMI 38.41 kg/m   Constitutional:  Alert and oriented, No acute distress. HEENT: [place] AT, moist mucus membranes.  Trachea midline, no masses.  Cardiovascular: No clubbing, cyanosis, or edema. Respiratory: Normal respiratory effort, no increased work of breathing. Skin: No rashes, bruises or suspicious lesions. Neurologic: Grossly intact, no focal deficits, moving all 4 extremities. Psychiatric: Normal mood and  affect.  Laboratory Data: Lab Results  Component Value Date   CREATININE 1.32 (H) 07/23/2021   Lab Results  Component Value Date   HGBA1C 5.7 05/19/2021    Urinalysis Results for orders placed or performed in visit on 10/21/21  Microscopic Examination   Urine  Result Value Ref Range   WBC, UA 0-5 0 - 5 /hpf   RBC 0-2 0 - 2 /hpf   Epithelial Cells (non renal) 0-10 0 - 10 /hpf   Bacteria, UA None seen None seen/Few  Urinalysis, Complete  Result Value Ref Range   Specific Gravity, UA 1.010 1.005 - 1.030   pH, UA 7.0 5.0 - 7.5   Color, UA Yellow Yellow   Appearance Ur Clear Clear   Leukocytes,UA Negative Negative   Protein,UA Negative Negative/Trace   Glucose, UA Negative Negative   Ketones, UA Negative Negative   RBC, UA Trace (A) Negative   Bilirubin, UA Negative Negative   Urobilinogen, Ur 1.0 0.2 - 1.0 mg/dL   Nitrite, UA Negative Negative   Microscopic Examination See below:      Assessment & Plan:    Microscopic hematuria  - UA benign that any persistent microscopic blood - likely related to infection which is now cleared along with her urinary symptoms  2. Urinary urge incontinence  - She would like to try oxybutynin 10 mg XL - Discussed side effects including skin redness/itching/irritation at the application site, dry mouth, drowsiness, dizziness, blurred vision  -Plan to reassess her urinary symptoms at follow-up  3.  Renal cyst/lesion Due for imaging at the 57-monthinterval, already scheduled   F/u as scheduled  IConley Rollsas a scribe for AHollice Espy MD.,have documented all relevant documentation on the behalf of AHollice Espy MD,as directed by  AHollice Espy MD while in the presence of AHollice Espy MD.  I have reviewed the above documentation for accuracy and completeness, and I agree with the above.   AHollice Espy MD   [facility] [address] [place]ton[place]ollbr[place] [phone]

## 2021-10-21 NOTE — Progress Notes (Signed)
Virtual Visit via Video Note  I connected with [name] on 10/21/21 at 11:00 AM EDT by a video enabled telemedicine application and verified that I am speaking with the correct person using two identifiers.  Location Provider Location : [facility] Patient Location : Home  Participants: Patient , Provider   I discussed the limitations of evaluation and management by telemedicine and the availability of in person appointments. The patient expressed understanding and agreed to proceed.    I discussed the assessment and treatment plan with the patient. The patient was provided an opportunity to ask questions and all were answered. The patient agreed with the plan and demonstrated an understanding of the instructions.   The patient was advised to call back or seek an in-person evaluation if the symptoms worsen or if the condition fails to improve as anticipated.    [name] MD OP Progress Note  10/21/2021 6:21 PM Susan [name]  MRN:  [id number]  Chief Complaint:  Chief Complaint  Patient presents with   Follow-up: 61 year old female, Caucasian with history of PTSD, bipolar disorder, alcohol use in remission, presented for medication management.   HPI: Susan [name] is a 61 year old Caucasian female, widowed, disabled, lives in [place], has a history of bipolar disorder, alcohol and cocaine use disorder in remission, tobacco use disorder, PTSD, bereavement, degenerative disc disease, obstructive sleep apnea on CPAP, history of total hip replacement, osteoarthritis, hepatitis C was evaluated by telemedicine today.  Patient today reports since the past few weeks she has been struggling with fatigue, low motivation.  She wonders whether she should go back on her previous dosage of Cymbalta.  She was previously on 60 mg daily.  The dosage was reduced due to her history of high blood pressure as well as the patient tapering it off herself.  Patient continues to be compliant on the [place].   Denies side effects.  She reports sleep is overall okay, there are some days when she takes a nap during the day.  Denies any appetite changes.  Denies any suicidality, homicidality or perceptual disturbances.  Continues to smoke cigarettes, receptive to counseling.  Denies any other concerns today.  Visit Diagnosis:    ICD-10-CM   1. Bipolar 1 disorder, depressed, mild (HCC)  F31.31 DULoxetine (CYMBALTA) 30 MG capsule    DULoxetine (CYMBALTA) 20 MG capsule    2. PTSD (post-traumatic stress disorder)  F43.10 DULoxetine (CYMBALTA) 30 MG capsule    DULoxetine (CYMBALTA) 20 MG capsule    3. Tobacco use disorder  F17.200     4. Bereavement  Z63.4     5. Alcohol use disorder, severe, in sustained remission ([place])  F10.21     6. Cocaine use disorder, moderate, in sustained remission ([place])  F14.21       Past Psychiatric History: Reviewed past psychiatric history from progress note on 09/27/2019.  Past trials of Seroquel, Cymbalta, Abilify, risperidone.  Past Medical History:  Past Medical History:  Diagnosis Date   ADD (attention deficit disorder)    Alcohol abuse    Anemia    Anxiety    Aortic atherosclerosis ([place]) 02/20/2018   Chest CT Sept 2019   Arthritis    rheumatoid arthritis   Asthma    Bipolar disorder (HCC)    Constipation    Degenerative disc disease at L5-S1 level 09/28/2016   See ortho note May 2018   Depression    bipolar, hx of suicide attempt   Diabetes mellitus, type 2 ([place])    Diverticulitis  2013   DOE (dyspnea on exertion)    Drug use    GERD (gastroesophageal reflux disease)    H/O suicide attempt    slit wrists   Hepatitis C 06/26/2014   Hip pain    History of echocardiogram    a. 04/2019 Echo: EF >65%, nl RV fxn.   History of MRSA infection 2013   HLD (hyperlipidemia)    Hypertension    a. 06/2021 Renal Duplex: ? bilat RAS; b. 06/2021 CTA Abd: No signif RAS.   Hyponatremia    Hypothyroidism    Incisional hernia 11/08/2012   Knee pain     Multinodular thyroid    OSA (obstructive sleep apnea)    Osteoporosis    Palpitations    Post-traumatic osteoarthritis of right knee 09/24/2015   Recurrent ventral hernia 11/08/2012   Status post total right knee replacement using cement 05/10/2016   Vitamin B12 deficiency    Vitamin D deficiency disease     Past Surgical History:  Procedure Laterality Date   BILATERAL SALPINGOOPHORECTOMY     due to abnormal mass   BREAST BIOPSY Left    neg   BREAST SURGERY Left 20 yrs ago   CESAREAN SECTION     COLONOSCOPY     COLONOSCOPY WITH PROPOFOL N/A 10/16/2017   Procedure: COLONOSCOPY WITH PROPOFOL;  Surgeon: Jonathon Bellows, MD;  Location: [facility] ENDOSCOPY;  Service: Gastroenterology;  Laterality: N/A;   COLONOSCOPY WITH PROPOFOL N/A 02/16/2021   Procedure: COLONOSCOPY WITH PROPOFOL;  Surgeon: Jonathon Bellows, MD;  Location: [facility] ENDOSCOPY;  Service: Gastroenterology;  Laterality: N/A;   HERNIA REPAIR  06/2011, July 2014   Ventral wall repair with Physiomesh   HERNIA REPAIR     2nd.vental wall repair   JOINT REPLACEMENT Right    knee   TONSILLECTOMY     TOTAL HIP ARTHROPLASTY Left 07/23/2019   Procedure: TOTAL HIP ARTHROPLASTY;  Surgeon: Corky Mull, MD;  Location: [facility] ORS;  Service: Orthopedics;  Laterality: Left;   TOTAL KNEE ARTHROPLASTY Right 05/10/2016   Procedure: TOTAL KNEE ARTHROPLASTY;  Surgeon: Corky Mull, MD;  Location: [facility] ORS;  Service: Orthopedics;  Laterality: Right;   TUBAL LIGATION      Family Psychiatric History: Reviewed family psychiatric history from progress note on 09/27/2019.  Family History:  Family History  Problem Relation Age of Onset   Arthritis Mother    Asthma Mother    Mental illness Mother    Thyroid disease Mother    COPD Mother    Heart disease Mother    Congestive Heart Failure Mother    Alcohol abuse Mother    Eating disorder Mother    Bipolar disorder Mother    Alcohol abuse Father    Heart attack Father    Alcohol abuse Sister    Drug abuse  Sister    Mental illness Sister    Mental illness Sister    Fibromyalgia Sister    Obesity Sister    Pneumonia Sister    Depression Sister    Mental illness Sister    Alcohol abuse Sister    Drug abuse Sister    Arthritis Brother    Mental illness Brother    Cancer Brother        non-hodkins lymphoma   Drug abuse Daughter    Drug abuse Son    Alcohol abuse Son    Drug abuse Son    Alcohol abuse Son    Diabetes Neg Hx    Stroke Neg Hx  Breast cancer Neg Hx    Thyroid cancer Neg Hx     Social History: Reviewed social history from progress note on 09/27/2019. Social History   Socioeconomic History   Marital status: Widowed    Spouse name: Not on file   Number of children: 3   Years of education: Not on file   Highest education level: Some college, no degree  Occupational History   Occupation: disabled  Tobacco Use   Smoking status: Every Day    Packs/day: 1.50    Years: 41.00    Total pack years: 61.50    Types: Cigarettes   Smokeless tobacco: Never   Tobacco comments:    1ppd 09/01/2021  Vaping Use   Vaping Use: Former   Quit date: 07/15/2017  Substance and Sexual Activity   Alcohol use: No    Alcohol/week: 0.0 standard drinks of 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   Drug use: No    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   Sexual activity: Not Currently  Other Topics Concern   Not on file  Social History Narrative   Not on file   Social Determinants of Health   Financial Resource Strain: Low Risk  (06/02/2021)   Overall Financial Resource Strain (CARDIA)    Difficulty of Paying Living Expenses: Not hard at all  Food Insecurity: No Food Insecurity (06/02/2021)   Hunger Vital Sign    Worried About Running Out of Food in the Last Year: Never true    [place] in the Last Year: Never true  Transportation Needs: No Transportation Needs (06/02/2021)   PRAPARE - Hydrologist (Medical): No    Lack of  Transportation (Non-Medical): No  Physical Activity: Sufficiently Active (06/02/2021)   Exercise Vital Sign    Days of Exercise per Week: 5 days    Minutes of Exercise per Session: 30 min  Stress: No Stress Concern Present (06/02/2021)   [place]    Feeling of Stress : Not at all  Social Connections: Moderately Isolated (06/02/2021)   Social Connection and Isolation Panel [NHANES]    Frequency of Communication with Friends and Family: More than three times a week    Frequency of Social Gatherings with Friends and Family: Three times a week    Attends Religious Services: Never    Active Member of Clubs or Organizations: Yes    Attends Archivist Meetings: More than 4 times per year    Marital Status: Widowed    Allergies:  Allergies  Allergen Reactions   Wellbutrin [Bupropion]     Patient reports made " deathly sick " years ago at appointment 03/18/20.   Lasix [Furosemide] Other (See Comments)    Electrolyte imbalance    Metabolic Disorder Labs: Lab Results  Component Value Date   HGBA1C 5.7 05/19/2021   MPG 108 04/08/2019   MPG 103 08/16/2017   No results found for: "PROLACTIN" Lab Results  Component Value Date   CHOL 151 02/10/2021   TRIG 124.0 02/10/2021   HDL 55.20 02/10/2021   CHOLHDL 3 02/10/2021   VLDL 24.8 02/10/2021   LDLCALC 71 02/10/2021   LDLCALC 79 08/21/2019   Lab Results  Component Value Date   TSH 1.15 04/28/2021   TSH 1.09 10/16/2019    Therapeutic Level Labs: No results found for: "LITHIUM" No results found for: "VALPROATE" No results found for: "CBMZ"  Current  Medications: Current Outpatient Medications  Medication Sig Dispense Refill   Accu-Chek Softclix Lancets lancets TEST 4 TIMES A DAY 100 each 1   acetaminophen (TYLENOL) 325 MG tablet Take 650 mg by mouth every 6 (six) hours as needed.     albuterol (PROAIR HFA) 108 (90 Base) MCG/ACT inhaler Inhale 2 puffs  into the lungs every 6 (six) hours as needed for wheezing or shortness of breath. 1 each 5   blood glucose meter kit and supplies Dispense based on patient and insurance preference. Use up to four times daily as directed. (FOR ICD-10 E10.9, E11.9). 1 each 0   carvedilol (COREG) 25 MG tablet TAKE 1 TABLET BY MOUTH TWICE A DAY WITH MEAL. MONITOR HEART RATE 60 tablet 1   clindamycin (CLEOCIN T) 1 % external solution Apply to aa's scalp QD PRN 60 mL 3   clotrimazole (LOTRIMIN) 1 % cream Apply 1 application topically 2 (two) times daily as needed.     cyclobenzaprine (FLEXERIL) 5 MG tablet Take 5 mg by mouth 2 (two) times daily as needed for muscle spasms.      DULoxetine (CYMBALTA) 20 MG capsule Take 1 capsule (20 mg total) by mouth daily. Take along with 30 mg daily 30 capsule 1   Fluticasone-Umeclidin-Vilant (TRELEGY ELLIPTA) 100-62.5-25 MCG/ACT AEPB      gabapentin (NEURONTIN) 300 MG capsule Take 600 mg by mouth 2 (two) times daily.      glucose blood (ACCU-CHEK GUIDE) test strip TEST UP TO FOUR TIMES A DAY 100 each 0   HYDROcodone-acetaminophen (NORCO/VICODIN) 5-325 MG tablet Take 1 tablet by mouth 3 (three) times daily as needed for moderate pain.     lurasidone (LATUDA) 40 MG TABS tablet Take 0.5 tablets (20 mg total) by mouth 2 (two) times daily with a meal. 30 tablet 1   meloxicam (MOBIC) 15 MG tablet Take 15 mg by mouth daily.     omeprazole (PRILOSEC) 20 MG capsule TAKE 1 CAPSULE BY MOUTH DAILY AS NEEDED TAKE 30 MINUTES BEFORE BREAKFAST 90 capsule 1   oxybutynin (DITROPAN-XL) 10 MG 24 hr tablet Take 1 tablet (10 mg total) by mouth daily. 30 tablet 11   OZEMPIC, 1 MG/DOSE, 4 MG/3ML SOPN INJECT 1 MG AS DIRECTED ONCE A WEEK. 3 mL 1   telmisartan (MICARDIS) 80 MG tablet TAKE 1 TABLET (80 MG TOTAL) BY MOUTH DAILY. 30 tablet 2   DULoxetine (CYMBALTA) 30 MG capsule Take 1 capsule (30 mg total) by mouth daily. Take along with 20 mg daily 30 capsule 1   No current facility-administered medications  for this visit.     Musculoskeletal: Strength & Muscle Tone:  UTA Gait & Station:  Seated Patient leans: N/A  Psychiatric Specialty Exam: Review of Systems  Psychiatric/Behavioral:  Positive for dysphoric mood.   All other systems reviewed and are negative.   There were no vitals taken for this visit.There is no height or weight on file to calculate BMI.  General Appearance: Casual  Eye Contact:  Fair  Speech:  Clear and Coherent  Volume:  Normal  Mood:  Dysphoric  Affect:  Congruent  Thought Process:  Goal Directed and Descriptions of Associations: Intact  Orientation:  Full (Time, Place, and Person)  Thought Content: Logical   Suicidal Thoughts:  No  Homicidal Thoughts:  No  Memory:  Immediate;   Fair Recent;   Fair Remote;   Fair  Judgement:  Fair  Insight:  Fair  Psychomotor Activity:  Normal  Concentration:  Concentration: Fair and  Attention Span: Fair  Recall:  [employer] of Knowledge: Fair  Language: Fair  Akathisia:  No  Handed:  Right  AIMS (if indicated): done  Assets:  Communication Skills Desire for Improvement Housing Social Support  ADL's:  Intact  Cognition: WNL  Sleep:   restless at times - overall OK   Screenings: AIMS    Flowsheet Row Video Visit from 10/21/2021 in [place] Total Score 0      GAD-7    Flowsheet Row Video Visit from 04/22/2021 in [place] Office Visit from 11/06/2019 in [facility] Office Visit from 09/06/2018 in [place]  Total GAD-7 Score '5 21 14      ' PHQ2-9    Flowsheet Row Video Visit from 10/21/2021 in [place] Office Visit from 07/21/2021 in [place] Office Visit from 07/14/2021 in [place] from 06/02/2021 in [place] Visit from 05/19/2021 in [place]  PHQ-2 Total  Score '1 1 1 ' 0 1  PHQ-9 Total Score 4 3 -- -- --      Flowsheet Row Video Visit from 10/21/2021 in [place] Office Visit from 07/21/2021 in [place] ED from 04/12/2021 in [place] RISK CATEGORY Low Risk Low Risk No Risk        Assessment and Plan: Susan [name] is a 61 year old Caucasian female who has a history of bipolar disorder, degenerative disc disease, multiple medical problems was evaluated by telemedicine today.  Patient with depressive symptoms, interested in dosage increase of Cymbalta, will benefit from the following plan.  Plan Bipolar disorder type I depressed mild-unstable Increase Cymbalta to 50 mg p.o. daily Latuda 40 mg p.o. daily at reduced dosage.  PTSD-stable Continue CBT as needed- Mr.Mostafa Continue Cymbalta as prescribed  Bereavement-stable Continue CBT  Tobacco use disorder-unstable Provided counseling for 2 minutes.  Discussed the APP - [place]. She does have nicotine replacement therapy available.  Encouraged compliance.  Alcohol use disorder in remission Continue AA meetings.  Follow-up in clinic in 4 weeks or sooner if needed.  Consent: Patient/Guardian gives verbal consent for treatment and assignment of benefits for services provided during this visit. Patient/Guardian expressed understanding and agreed to proceed.   This note was generated in part or whole with voice recognition software. Voice recognition is usually quite accurate but there are transcription errors that can and very often do occur. I apologize for any typographical errors that were not detected and corrected.      Ursula Alert, MD 10/21/2021, 6:21 PM

## 2021-11-03 ENCOUNTER — Other Ambulatory Visit: Payer: Self-pay

## 2021-11-03 DIAGNOSIS — I10 Essential (primary) hypertension: Secondary | ICD-10-CM

## 2021-11-03 MED ORDER — TELMISARTAN 80 MG PO TABS: 80.0000 mg | ORAL_TABLET | Freq: Every day | ORAL | 0 refills | Status: DC

## 2021-11-08 ENCOUNTER — Telehealth: Payer: Self-pay

## 2021-11-08 ENCOUNTER — Other Ambulatory Visit: Payer: Self-pay | Admitting: *Deleted

## 2021-11-08 DIAGNOSIS — I10 Essential (primary) hypertension: Secondary | ICD-10-CM

## 2021-11-08 MED ORDER — TELMISARTAN 80 MG PO TABS: 80.0000 mg | ORAL_TABLET | Freq: Every day | ORAL | 5 refills | Status: DC

## 2021-11-08 NOTE — Telephone Encounter (Signed)
Patient states she has switched pharmacies from [place] to [facility] on [address] in [place], near [place].  Patient states she took her last dose of telmisartan (MICARDIS) 80 MG tablet today and needs a refill.  Patient states she would like for [name] to call and let her know when the prescription has been sent to [facility].

## 2021-11-08 NOTE — Telephone Encounter (Signed)
Medication refill has been sent to [facility] on [address]. Will notify pt via mychart.

## 2021-11-09 ENCOUNTER — Emergency Department: Payer: Medicare Other

## 2021-11-09 ENCOUNTER — Other Ambulatory Visit: Payer: Self-pay

## 2021-11-09 ENCOUNTER — Emergency Department
Admission: EM | Admit: 2021-11-09 | Discharge: 2021-11-09 | Disposition: A | Discharge status: Home / Self Care | Payer: Medicare Other | Attending: Emergency Medicine | Admitting: Emergency Medicine

## 2021-11-09 DIAGNOSIS — I10 Essential (primary) hypertension: Secondary | ICD-10-CM | POA: Diagnosis not present

## 2021-11-09 DIAGNOSIS — E039 Hypothyroidism, unspecified: Secondary | ICD-10-CM | POA: Insufficient documentation

## 2021-11-09 DIAGNOSIS — M7122 Synovial cyst of popliteal space [Baker], left knee: Secondary | ICD-10-CM | POA: Diagnosis not present

## 2021-11-09 DIAGNOSIS — M79605 Pain in left leg: Secondary | ICD-10-CM | POA: Diagnosis present

## 2021-11-09 NOTE — ED Triage Notes (Signed)
Pt to ED for L leg pain. Diagnosed with tendinitis to L achilles tendon 2 weeks ago, was placed in tall boot, which broke then pt was given short boot which doesn't work for her. States has pain and swelling from knee to ankle. L ankle appears swollen compared to R.  Pt ambulatory with limping gait. Describes pain as tight, stretching and burning, 8/10. Pt was treated with steroids already. States that 4 days ago was getting up and felt like something was tearing in her L lower leg.

## 2021-11-09 NOTE — Discharge Instructions (Addendum)
-  You may take Tylenol/ibuprofen as needed for pain.  -Please follow-up with your podiatry appointment in 10 days.  You may also follow-up with the orthopedist listed if the Baker's cyst fails to improve.  -Return to the emergency department anytime if you begin to experience any new or worsening symptoms.

## 2021-11-09 NOTE — ED Notes (Signed)
Discharge paperwork provided and reviewed with patient. Followup and RX info reviewed as applicable. Pt provides verbal consent for dc at this time and declines vs at time of dc. pt to lobby alert and oriented.

## 2021-11-09 NOTE — ED Provider Notes (Signed)
[facility] Provider Note    Event Date/Time   First MD Initiated Contact with Patient 11/09/21 1911     (approximate)   History   Chief Complaint Leg Pain   HPI Susan [name] is a 61 y.o. female, history of bipolar 1, hypothyroidism, hypertension, GERD, alcohol use disorder in remission, anxiety, presents to the emergency department for evaluation of leg pain.  Reports being diagnosed with tendinitis in the left Achilles tendon approximately 2 weeks ago and was placed in a tall boot by podiatry. Approximately 1 week ago, she began developing swelling in her left lower extremity with increased pain behind her left knee/upper calf region.  Denies fever/chills, cold sensation or numbness/tingling in affected extremity, nausea/vomiting, chest pain, shortness of breath, flank pain, abdominal pain, or dizziness/lightheadedness.  Denies any recent falls, injuries, or illnesses.  History Limitations: No limitations.        Physical Exam  Triage Vital Signs: ED Triage Vitals  Enc Vitals Group     BP 11/09/21 1750 (!) 166/89     Pulse Rate 11/09/21 1750 67     Resp 11/09/21 1750 16     Temp 11/09/21 1750 98.2 F (36.8 C)     Temp Source 11/09/21 1750 Oral     SpO2 11/09/21 1750 95 %     Weight 11/09/21 1747 209 lb 14.1 oz (95.2 kg)     Height 11/09/21 1747 '5\' 2"'$  (1.575 m)     Head Circumference --      Peak Flow --      Pain Score 11/09/21 1746 8     Pain Loc --      Pain Edu? --      Excl. in [place]? --     Most recent vital signs: Vitals:   11/09/21 1750  BP: (!) 166/89  Pulse: 67  Resp: 16  Temp: 98.2 F (36.8 C)  SpO2: 95%    General: Awake, NAD.  Skin: Warm, dry. No rashes or lesions.  Eyes: PERRL. Conjunctivae normal.  CV: Good peripheral perfusion.  Resp: Normal effort.  Abd: Soft, non-tender. No distention.  Neuro: At baseline. No gross neurological deficits.   Focused Exam: 1+ pitting edema in the left lower extremity.  No  surrounding warmth or erythema.  Edema extends from the left foot up to the proximal calf.  Point tenderness along the upper calf/popliteal region.  No palpable cords PMS intact distally.  Patient maintains normal range of motion.  She is able to ambulate well on her own without assistance  Physical Exam    ED Results / Procedures / Treatments  Labs (all labs ordered are listed, but only abnormal results are displayed) Labs Reviewed - No data to display   EKG N/A.   RADIOLOGY  ED Provider Interpretation: I personally reviewed this ultrasound, no evidence of DVT based on my interpretation.  US Venous Img Lower  Left (DVT Study)  Result Date: 11/09/2021 CLINICAL DATA:  Left leg pain and swelling for 2 weeks EXAM: LEFT LOWER EXTREMITY VENOUS DOPPLER ULTRASOUND TECHNIQUE: Gray-scale sonography with graded compression, as well as color Doppler and duplex ultrasound were performed to evaluate the lower extremity deep venous systems from the level of the common femoral vein and including the common femoral, femoral, profunda femoral, popliteal and calf veins including the posterior tibial, peroneal and gastrocnemius veins when visible. The superficial great saphenous vein was also interrogated. Spectral Doppler was utilized to evaluate flow at rest and with distal augmentation  maneuvers in the common femoral, femoral and popliteal veins. COMPARISON:  None Available. FINDINGS: Contralateral Common Femoral Vein: Respiratory phasicity is normal and symmetric with the symptomatic side. No evidence of thrombus. Normal compressibility. Common Femoral Vein: No evidence of thrombus. Normal compressibility, respiratory phasicity and response to augmentation. Saphenofemoral Junction: No evidence of thrombus. Normal compressibility and flow on color Doppler imaging. Profunda Femoral Vein: No evidence of thrombus. Normal compressibility and flow on color Doppler imaging. Femoral Vein: No evidence of thrombus.  Normal compressibility, respiratory phasicity and response to augmentation. Popliteal Vein: No evidence of thrombus. Normal compressibility, respiratory phasicity and response to augmentation. Calf Veins: No evidence of thrombus. Normal compressibility and flow on color Doppler imaging. Superficial Great Saphenous Vein: No evidence of thrombus. Normal compressibility. Venous Reflux:  None. Other Findings: Popliteal cyst is noted on the left measuring up to 3.5 cm. IMPRESSION: No evidence of deep venous thrombosis. Left popliteal cyst. Electronically Signed   By: Inez Catalina M.D.   On: 11/09/2021 20:07    PROCEDURES:  Critical Care performed: N/A.  Procedures    MEDICATIONS ORDERED IN ED: Medications - No data to display   IMPRESSION / MDM / [place] / ED COURSE  I reviewed the triage vital signs and the nursing notes.                              Differential diagnosis includes, but is not limited to, DVT, Baker's cyst, gastrocnemius tear, tendinitis.  Assessment/Plan Presentation consistent with popliteal cyst.  Confirmed by ultrasound.  No evidence of DVT.  I suspect that this cyst is also complicating her underlying tendinitis.  Low suspicion for any significant soft tissue injury given lack of mechanism.  She states that her tall boot recently broke.  We will provide her with a new boot.  She has an appointment with podiatry in approximately 10 days.  Encouraged her to follow through with this appointment.  We will additionally provide her with referral to orthopedics as well if Baker cyst fails to improve. Offered pain medication, however patient states that she feels fine.  We will plan to discharge.  Patient's presentation is most consistent with acute complicated illness / injury requiring diagnostic workup.   Provided the patient with anticipatory guidance, return precautions, and educational material. Encouraged the patient to return to the emergency department at any  time if they begin to experience any new or worsening symptoms. Patient expressed understanding and agreed with the plan.       FINAL CLINICAL IMPRESSION(S) / ED DIAGNOSES   Final diagnoses:  Baker cyst, left     Rx / DC Orders   ED Discharge Orders     None        Note:  This document was prepared using Dragon voice recognition software and may include unintentional dictation errors.   Teodoro Spray, [place] 11/09/21 2136    Arta Silence, MD 11/14/21 1423

## 2021-11-18 ENCOUNTER — Other Ambulatory Visit: Payer: Self-pay | Admitting: Podiatry

## 2021-11-18 DIAGNOSIS — M7732 Calcaneal spur, left foot: Secondary | ICD-10-CM

## 2021-11-23 ENCOUNTER — Telehealth (INDEPENDENT_AMBULATORY_CARE_PROVIDER_SITE_OTHER): Payer: Self-pay | Admitting: Psychiatry

## 2021-11-23 DIAGNOSIS — Z91199 Patient's noncompliance with other medical treatment and regimen due to unspecified reason: Secondary | ICD-10-CM

## 2021-11-23 NOTE — Progress Notes (Signed)
No response to call or text or video invite  

## 2021-11-26 ENCOUNTER — Ambulatory Visit
Admission: RE | Admit: 2021-11-26 | Discharge: 2021-11-26 | Disposition: A | Discharge status: Home / Self Care | Payer: Medicare Other | Source: Ambulatory Visit | Attending: Podiatry | Admitting: Podiatry

## 2021-11-26 DIAGNOSIS — M7732 Calcaneal spur, left foot: Secondary | ICD-10-CM | POA: Diagnosis present

## 2021-12-10 ENCOUNTER — Other Ambulatory Visit: Payer: Self-pay

## 2021-12-10 ENCOUNTER — Telehealth: Payer: Self-pay | Admitting: Family Medicine

## 2021-12-10 DIAGNOSIS — E119 Type 2 diabetes mellitus without complications: Secondary | ICD-10-CM

## 2021-12-10 MED ORDER — OZEMPIC (1 MG/DOSE) 4 MG/3ML ~~LOC~~ SOPN: PEN_INJECTOR | SUBCUTANEOUS | 1 refills | Status: DC

## 2021-12-10 NOTE — Telephone Encounter (Signed)
Medication sent to the pharmacy.  Chynna Buerkle,cma  

## 2021-12-10 NOTE — Telephone Encounter (Signed)
Patient called asking for a medication fill on the following: OZEMPIC, 1 MG/DOSE, 4 MG/3ML SOPN

## 2021-12-13 ENCOUNTER — Telehealth: Payer: Self-pay | Admitting: Psychiatry

## 2021-12-13 DIAGNOSIS — F3176 Bipolar disorder, in full remission, most recent episode depressed: Secondary | ICD-10-CM

## 2021-12-13 MED ORDER — LURASIDONE HCL 40 MG PO TABS: 20.0000 mg | ORAL_TABLET | Freq: Two times a day (BID) | ORAL | 1 refills | Status: DC

## 2021-12-13 NOTE — Telephone Encounter (Signed)
I sent Latuda 20 mg twice a day to [facility].

## 2021-12-13 NOTE — Telephone Encounter (Signed)
Patient called to schedule her missed appointment. She is scheduled for Sept. Also calling to request refill on Latuda. She has run out.

## 2021-12-13 NOTE — Telephone Encounter (Signed)
She would like sent to [facility] on [address]

## 2021-12-14 NOTE — Telephone Encounter (Signed)
left message that latuda was sent into pharmacy

## 2021-12-15 ENCOUNTER — Other Ambulatory Visit: Payer: Self-pay | Admitting: Psychiatry

## 2021-12-15 ENCOUNTER — Telehealth: Payer: Self-pay | Admitting: Psychiatry

## 2021-12-15 ENCOUNTER — Encounter (INDEPENDENT_AMBULATORY_CARE_PROVIDER_SITE_OTHER): Payer: Self-pay

## 2021-12-15 DIAGNOSIS — F3131 Bipolar disorder, current episode depressed, mild: Secondary | ICD-10-CM

## 2021-12-15 DIAGNOSIS — F4310 Post-traumatic stress disorder, unspecified: Secondary | ICD-10-CM

## 2021-12-15 MED ORDER — DULOXETINE HCL 30 MG PO CPEP: 30.0000 mg | ORAL_CAPSULE | Freq: Every day | ORAL | 1 refills | Status: DC

## 2021-12-15 NOTE — Telephone Encounter (Signed)
I have sent both dosages of duloxetine 30 mg and 20 mg to pharmacy.

## 2022-01-07 ENCOUNTER — Ambulatory Visit
Admission: RE | Admit: 2022-01-07 | Discharge: 2022-01-07 | Disposition: A | Discharge status: Home / Self Care | Payer: Medicare Other | Source: Ambulatory Visit | Attending: Urology | Admitting: Urology

## 2022-01-07 DIAGNOSIS — N281 Cyst of kidney, acquired: Secondary | ICD-10-CM

## 2022-01-07 DIAGNOSIS — N2889 Other specified disorders of kidney and ureter: Secondary | ICD-10-CM | POA: Diagnosis present

## 2022-01-07 MED ORDER — GADOBUTROL 1 MMOL/ML IV SOLN
9.0000 mL | Freq: Once | INTRAVENOUS | Status: AC | PRN
  Administered 2022-01-07: 9 mL via INTRAVENOUS

## 2022-01-19 ENCOUNTER — Encounter: Payer: Self-pay | Admitting: Urology

## 2022-01-19 ENCOUNTER — Ambulatory Visit (INDEPENDENT_AMBULATORY_CARE_PROVIDER_SITE_OTHER): Payer: Medicare Other | Admitting: Urology

## 2022-01-19 VITALS — HR 79 | Ht 62.0 in | Wt 213.0 lb

## 2022-01-19 DIAGNOSIS — R3915 Urgency of urination: Secondary | ICD-10-CM

## 2022-01-19 DIAGNOSIS — N281 Cyst of kidney, acquired: Secondary | ICD-10-CM | POA: Diagnosis not present

## 2022-01-19 LAB — BLADDER SCAN AMB NON-IMAGING

## 2022-01-19 NOTE — Progress Notes (Signed)
01/19/2022 2:57 PM   Susan [name] 1960/07/12 [id number]  Referring provider: Doreen Beam, FNP No address on file  Chief Complaint  Patient presents with   Over Active Bladder    HPI: 61 year old female who presents today for 71-monthfollow-up.  She has a personal history of microscopic hematuria associated with urinary tract infection which subsequently cleared, urgent continence started on oxybutynin 10 mg XL 3 months ago as well as personal history of renal cyst/lesions on surveillance.  She follows up today with an MRI which shows multiple simple cysts along with hemorrhagic and proteinaceous cysts of the left superior and right lower poles.  All of the cyst are categorized as Bosniak 1 and 2.  None appear suspicious or malignant.  She reports today that she try the oxybutynin.  It did help decrease her urgency frequency symptoms but she got to the point where she felt like she was going too long in between urinations although she was not uncomfortable.  She did feel like ultimately she was able to empty her bladder.  She does not like the feeling and up discontinuing this medication.  In the meantime, she has implemented some behavioral changes like going to the bathroom when she first feels the urge and this prevents her from having accidents.  She is satisfied with this as a strategy.  PMH: Past Medical History:  Diagnosis Date   ADD (attention deficit disorder)    Alcohol abuse    Anemia    Anxiety    Aortic atherosclerosis [place] 02/[place]   Chest CT Sept 2019   Arthritis    rheumatoid arthritis   Asthma    Bipolar disorder [place]    Constipation    Degenerative disc disease at L5-S1 level 09/28/2016   See ortho note May 2018   Depression    bipolar, hx of suicide attempt   Di[place] type 2 [place]    Diverticulitis 2013   DOE (dyspnea on exertion)    Drug use    GERD (gastroesophageal reflux disease)    H/O suicide attempt    slit wrists    Hepatitis C 06/26/2014   Hip pain    History of echocardiogram    a. 04/2019 Echo: EF >65%, nl RV fxn.   History of MRSA infection 2013   HLD (hyperlipidemia)    Hypertension    a. 06/2021 Renal Duplex: ? bilat RAS; b. 06/2021 CTA Abd: No signif RAS.   Hyponatremia    Hypothyroidism    Incisional hernia 11/08/2012   Knee pain    Multinodular thyroid    OSA (obstructive sleep apnea)    Osteoporosis    Palpitations    Post-traumatic osteoarthritis of right knee 09/24/2015   Recurrent ventral hernia 11/08/2012   Status post total right knee replacement using cement 05/10/2016   Vitamin B12 deficiency    Vitamin D deficiency disease     Surgical History: Past Surgical History:  Procedure Laterality Date   BILATERAL SALPINGOOPHORECTOMY     due to abnormal mass   BREAST BIOPSY Left    neg   BREAST SURGERY Left 20 yrs ago   CESAREAN SECTION     COLONOSCOPY     COLONOSCOPY WITH PROPOFOL N/A 10/16/2017   Procedure: COLONOSCOPY WITH PROPOFOL;  Surgeon: AJonathon Bellows MD;  [facility];  Service: Gastroenterology;  Laterality: N/A;   COLONOSCOPY WITH PROPOFOL N/A 02/16/2021   Procedure: COLONOSCOPY WITH PROPOFOL;  Surgeon: AJonathon Bellows MD;  [facility];  Service: Gastroenterology;  Laterality: N/A;   HERNIA REPAIR  06/2011, July 2014   Ventral wall repair with Physiomesh   HERNIA REPAIR     2nd.vental wall repair   JOINT REPLACEMENT Right    knee   TONSILLECTOMY     TOTAL HIP ARTHROPLASTY Left 07/23/2019   Procedure: TOTAL HIP ARTHROPLASTY;  Surgeon: Corky Mull, MD;  Location: [facility];  Service: Orthopedics;  Laterality: Left;   TOTAL KNEE ARTHROPLASTY Right 05/10/2016   Procedure: TOTAL KNEE ARTHROPLASTY;  Surgeon: Corky Mull, MD;  Location: [facility];  Service: Orthopedics;  Laterality: Right;   TUBAL LIGATION      Home Medications:  Allergies as of 01/19/2022       Reactions   Wellbutrin [bupropion]    Patient reports made " deathly sick " years  ago at appointment 03/18/20.   Lasix [furosemide] Other (See Comments)   Electrolyte imbalance        Medication List        Accurate as of January 19, 2022  2:57 PM. If you have any questions, ask your nurse or doctor.          Accu-Chek Guide test strip Generic drug: glucose blood TEST UP TO FOUR TIMES A DAY   Accu-Chek Softclix Lancets lancets TEST 4 TIMES A DAY   acetaminophen 325 MG tablet Commonly known as: TYLENOL Take 650 mg by mouth every 6 (six) hours as needed.   albuterol 108 (90 Base) MCG/ACT inhaler Commonly known as: ProAir HFA Inhale 2 puffs into the lungs every 6 (six) hours as needed for wheezing or shortness of breath.   blood glucose meter kit and supplies Dispense based on patient and insurance preference. Use up to four times daily as directed. (FOR ICD-10 E10.9, E11.9).   carvedilol 25 MG tablet Commonly known as: COREG TAKE 1 TABLET BY MOUTH TWICE A DAY WITH MEAL. MONITOR HEART RATE   clindamycin 1 % external solution Commonly known as: CLEOCIN T Apply to aa's scalp QD PRN   clotrimazole 1 % cream Commonly known as: LOTRIMIN Apply 1 application topically 2 (two) times daily as needed.   cyclobenzaprine 5 MG tablet Commonly known as: FLEXERIL Take 5 mg by mouth 2 (two) times daily as needed for muscle spasms.   DULoxetine 20 MG capsule Commonly known as: CYMBALTA TAKE 1 CAPSULE BY MOUTH DAILY WITH THE 30MG CAPSULE   DULoxetine 30 MG capsule Commonly known as: Cymbalta Take 1 capsule (30 mg total) by mouth daily. Take along with 20 mg daily   gabapentin 300 MG capsule Commonly known as: NEURONTIN Take 600 mg by mouth 2 (two) times daily.   HYDROcodone-acetaminophen 5-325 MG tablet Commonly known as: NORCO/VICODIN Take 1 tablet by mouth 3 (three) times daily as needed for moderate pain.   lurasidone 40 MG Tabs tablet Commonly known as: Latuda Take 0.5 tablets (20 mg total) by mouth 2 (two) times daily with a meal.    meloxicam 15 MG tablet Commonly known as: MOBIC Take 15 mg by mouth daily.   omeprazole 20 MG capsule Commonly known as: PRILOSEC TAKE 1 CAPSULE BY MOUTH DAILY AS NEEDED TAKE 30 MINUTES BEFORE BREAKFAST   oxybutynin 10 MG 24 hr tablet Commonly known as: DITROPAN-XL Take 1 tablet (10 mg total) by mouth daily.   Ozempic (1 MG/DOSE) 4 MG/3ML Sopn Generic drug: Semaglutide (1 MG/DOSE) INJECT 1 MG AS DIRECTED ONCE A WEEK.   telmisartan 80 MG tablet Commonly known as: MICARDIS Take 1 tablet (  80 mg total) by mouth daily.   Trelegy Ellipta 100-62.5-25 MCG/ACT Aepb Generic drug: Fluticasone-Umeclidin-Vilant        Allergies:  Allergies  Allergen Reactions   Wellbutrin [Bupropion]     Patient reports made " deathly sick " years ago at appointment 03/18/20.   Lasix [Furosemide] Other (See Comments)    Electrolyte imbalance    Family History: Family History  Problem Relation Age of Onset   Arthritis Mother    Asthma Mother    Mental illness Mother    Thyroid disease Mother    COPD Mother    Heart disease Mother    Congestive Heart Failure Mother    Alcohol abuse Mother    Eating disorder Mother    Bipolar disorder Mother    Alcohol abuse Father    Heart attack Father    Alcohol abuse Sister    Drug abuse Sister    Mental illness Sister    Mental illness Sister    Fibromyalgia Sister    Obesity Sister    Pneumonia Sister    Depression Sister    Mental illness Sister    Alcohol abuse Sister    Drug abuse Sister    Arthritis Brother    Mental illness Brother    Cancer Brother        non-hodkins lymphoma   Drug abuse Daughter    Drug abuse Son    Alcohol abuse Son    Drug abuse Son    Alcohol abuse Son    Diabetes Neg Hx    Stroke Neg Hx    Breast cancer Neg Hx    Thyroid cancer Neg Hx     Social History:  reports that she has been smoking cigarettes. She has a 61.50 pack-year smoking history. She has never used smokeless tobacco. She reports that she  does not drink alcohol and does not use drugs.   Physical Exam: Pulse 79   Ht _0  (1.575 m)   Wt 213 lb (96.6 kg)   BMI 38.96 kg/m   Constitutional:  Alert and oriented, No acute distress. HEENT: [place] AT, moist mucus membranes.  Trachea midline, no masses. Cardiovascular: No clubbing, cyanosis, or edema. Respiratory: Normal respiratory effort, no increased work of breathing. GI: Abdomen is soft, nontender, nondistended, no abdominal masses GU: No CVA tenderness Skin: No rashes, bruises or suspicious lesions. Neurologic: Grossly intact, no focal deficits, moving all 4 extremities. Psychiatric: Normal mood and affect.  Laboratory Data: Lab Results  Component Value Date   WBC 9.5 07/06/2021   HGB 12.9 07/06/2021   HCT 37.4 07/06/2021   MCV 94.8 07/06/2021   PLT 216.0 07/06/2021    Lab Results  Component Value Date   CREATININE 1.32 (H) 07/23/2021    MRI of the abdomen with and without contrast from 01/07/2022 imaging was personally reviewed.  Agree with radiologic interpretation as outlined below. IMPRESSION: 1. Multiple simple, benign bilateral renal cortical cysts, as well as a thinly septated exophytic cyst of the left kidney, and benign hemorrhagic or proteinaceous cysts of the superior pole of the left kidney and inferior pole of the right kidney. The latter lesion corresponds to indeterminate lesion identified by prior CT. No solid masses or suspicious contrast enhancement. No further follow-up or characterization is required for these benign Bosniak category I and II cysts. 2. Cholelithiasis.     Electronically Signed   By: Delanna Ahmadi M.D.   On: 01/07/2022 20:38   Results for orders placed or performed  in visit on 01/19/22  Bladder Scan (Post Void Residual) in office  Result Value Ref Range   Scan Result 23m     Assessment & Plan:    1. Urinary urgency Recently good effect from oxybutynin but ultimately elected not to continue this medication, improved  with behavior changes alone  At this point in time, if she is doing well, no need for further intervention.  To let u[place]ore[place]now if her symptoms worsen or she would like to return to pursuing pharmacotherapy for her urgency frequency type symptoms. - Bladder Scan (Post Void Residual) in office  2. 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

## 2022-01-20 ENCOUNTER — Telehealth (INDEPENDENT_AMBULATORY_CARE_PROVIDER_SITE_OTHER): Payer: Medicare Other | Admitting: Psychiatry

## 2022-01-20 ENCOUNTER — Encounter: Payer: Self-pay | Admitting: Psychiatry

## 2022-01-20 DIAGNOSIS — F1021 Alcohol dependence, in remission: Secondary | ICD-10-CM | POA: Diagnosis not present

## 2022-01-20 DIAGNOSIS — F4310 Post-traumatic stress disorder, unspecified: Secondary | ICD-10-CM

## 2022-01-20 DIAGNOSIS — F1421 Cocaine dependence, in remission: Secondary | ICD-10-CM

## 2022-01-20 DIAGNOSIS — F17200 Nicotine dependence, unspecified, uncomplicated: Secondary | ICD-10-CM | POA: Diagnosis not present

## 2022-01-20 DIAGNOSIS — F3176 Bipolar disorder, in full remission, most recent episode depressed: Secondary | ICD-10-CM

## 2022-01-20 NOTE — Progress Notes (Unsigned)
Virtual Visit via Video Note  I connected with [place] on 01/20/22 at  1:00 PM EDT by a video enabled telemedicine application and verified that I am speaking with the correct person using two identifiers.  Location Provider Location : [facility] Patient Location : Home  Participants: Patient , Provider    I discussed the limitations of evaluation and management by telemedicine and the availability of in person appointments. The patient expressed understanding and agreed to proceed.   I discussed the assessment and treatment plan with the patient. The patient was provided an opportunity to ask questions and all were answered. The patient agreed with the plan and demonstrated an understanding of the instructions.   The patient was advised to call back or seek an in-person evaluation if the symptoms worsen or if the condition fails to improve as anticipated.    [place] MD OP Progress Note  01/20/2022 1:24 PM Susan [name]  MRN:  [id number]  Chief Complaint:  Chief Complaint  Patient presents with   Follow-up: 61 year old female, presented for medication management for bipolar disorder with recent depressive episode.   HPI: Susan [name] is a 61 year old Caucasian female, widowed, disabled, lives in [place], has a history of bipolar disorder, alcohol and cocaine use disorder in remission, tobacco use disorder, PTSD, bereavement, degenerative disc disease, obstructive sleep apnea on CPAP, history of total hip replacement, osteoarthritis, hepatitis C was evaluated by telemedicine today.  Patient today reports she is currently struggling with sciatica which radiates down her right legs.  She currently takes Tylenol as well as gabapentin.  That does seem to help.  Overall she reports sleep is good although she does have pain at times.  Patient reports depression symptoms as improved on the Cymbalta.  She feels motivated and is able to stay focused at work.  She works third shift 3  times a week.  Work is currently going well.  Denies any side effects to Cymbalta.  Patient continues to be compliant on her Latuda, denies side effects.  Continues to attend AA meetings.  Currently cutting back on smoking, motivated to do so.  Planning to spend some time with her brother soon, looks forward to that.  Denies any suicidality, homicidality or perceptual disturbances.  Denies any other concerns today.  Visit Diagnosis:    ICD-10-CM   1. Bipolar 1 disorder, depressed, full remission ([place])  F31.76     2. PTSD (post-traumatic stress disorder)  F43.10     3. Tobacco use disorder  F17.200     4. Alcohol use disorder, severe, in sustained remission ([place])  F10.21     5. Cocaine use disorder, moderate, in sustained remission ([place])  F14.21       Past Psychiatric History: Reviewed past psychiatric history from progress note on 09/27/2019.  Past trials of Seroquel, Cymbalta, Abilify, risperidone.  Past Medical History:  Past Medical History:  Diagnosis Date   ADD (attention deficit disorder)    Alcohol abuse    Anemia    Anxiety    Aortic atherosclerosis ([place]) 02/20/2018   Chest CT Sept 2019   Arthritis    rheumatoid arthritis   Asthma    Bipolar disorder ([place])    Constipation    Degenerative disc disease at L5-S1 level 09/28/2016   See ortho note May 2018   Depression    bipolar, hx of suicide attempt   Diabetes mellitus, type 2 ([place])    Diverticulitis 2013   DOE (dyspnea on exertion)  Drug use    GERD (gastroesophageal reflux disease)    H/O suicide attempt    slit wrists   Hepatitis C 06/26/2014   Hip pain    History of echocardiogram    a. 04/2019 Echo: EF >65%, nl RV fxn.   History of MRSA infection 2013   HLD (hyperlipidemia)    Hypertension    a. 06/2021 Renal Duplex: ? bilat RAS; b. 06/2021 CTA Abd: No signif RAS.   Hyponatremia    Hypothyroidism    Incisional hernia 11/08/2012   Knee pain    Multinodular thyroid    OSA (obstructive sleep  apnea)    Osteoporosis    Palpitations    Post-traumatic osteoarthritis of right knee 09/24/2015   Recurrent ventral hernia 11/08/2012   Status post total right knee replacement using cement 05/10/2016   Vitamin B12 deficiency    Vitamin D deficiency disease     Past Surgical History:  Procedure Laterality Date   BILATERAL SALPINGOOPHORECTOMY     due to abnormal mass   BREAST BIOPSY Left    neg   BREAST SURGERY Left 20 yrs ago   CESAREAN SECTION     COLONOSCOPY     COLONOSCOPY WITH PROPOFOL N/A 10/16/2017   Procedure: COLONOSCOPY WITH PROPOFOL;  Surgeon: Jonathon Bellows, MD;  Location: [facility] ENDOSCOPY;  Service: Gastroenterology;  Laterality: N/A;   COLONOSCOPY WITH PROPOFOL N/A 02/16/2021   Procedure: COLONOSCOPY WITH PROPOFOL;  Surgeon: Jonathon Bellows, MD;  Location[facility];  Service: Gastroenterology;  Laterality: N/A;   HERNIA REPAIR  06/2011, July 2014   Ventral wall repair with Physiomesh   HERNIA REPAIR     2nd.vental wall repair   JOINT REPLACEMENT Right    knee   TONSILLECTOMY     TOTAL HIP ARTHROPLASTY Left 07/23/2019   Procedure: TOTAL HIP ARTHROPLASTY;  Surgeon: Corky Mull, MD;  Location: [facility] ORS;  Service: Orthopedics;  Laterality: Left;   TOTAL KNEE ARTHROPLASTY Right 05/10/2016   Procedure: TOTAL KNEE ARTHROPLASTY;  Surgeon: Corky Mull, MD;  Location: [facility] ORS;  Service: Orthopedics;  Laterality: Right;   TUBAL LIGATION      Family Psychiatric History: Reviewed family psychiatric history from progress note on 09/27/2019.  Family History:  Family History  Problem Relation Age of Onset   Arthritis Mother    Asthma Mother    Mental illness Mother    Thyroid disease Mother    COPD Mother    Heart disease Mother    Congestive Heart Failure Mother    Alcohol abuse Mother    Eating disorder Mother    Bipolar disorder Mother    Alcohol abuse Father    Heart attack Father    Alcohol abuse Sister    Drug abuse Sister    Mental illness Sister    Mental  illness Sister    Fibromyalgia Sister    Obesity Sister    Pneumonia Sister    Depression Sister    Mental illness Sister    Alcohol abuse Sister    Drug abuse Sister    Arthritis Brother    Mental illness Brother    Cancer Brother        non-hodkins lymphoma   Drug abuse Daughter    Drug abuse Son    Alcohol abuse Son    Drug abuse Son    Alcohol abuse Son    Diabetes Neg Hx    Stroke Neg Hx    Breast cancer Neg Hx  Thyroid cancer Neg Hx     Social History: I have reviewed social history from progress note on 09/27/2019. Social History   Socioeconomic History   Marital status: Widowed    Spouse name: Not on file   Number of children: 3   Years of education: Not on file   Highest education level: Some college, no degree  Occupational History   Occupation: disabled  Tobacco Use   Smoking status: Every Day    Packs/day: 1.50    Years: 41.00    Total pack years: 61.50    Types: Cigarettes   Smokeless tobacco: Never   Tobacco comments:    1ppd 09/01/2021  Vaping Use   Vaping Use: Former   Quit date: 07/15/2017  Substance and Sexual Activity   Alcohol use: No    Alcohol/week: 0.0 standard drinks of 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   Drug use: No    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   Sexual activity: Not Currently  Other Topics Concern   Not on file  Social History Narrative   Not on file   Social Determinants of Health   Financial Resource Strain: Low Risk  (06/02/2021)   Overall Financial Resource Strain (CARDIA)    Difficulty of Paying Living Expenses: Not hard at all  Food Insecurity: No Food Insecurity (06/02/2021)   Hunger Vital Sign    Worried About Running Out of Food in the Last Year: Never true    [place] in the Last Year: Never true  Transportation Needs: No Transportation Needs (06/02/2021)   PRAPARE - Hydrologist (Medical): No    Lack of Transportation (Non-Medical): No   Physical Activity: Sufficiently Active (06/02/2021)   Exercise Vital Sign    Days of Exercise per Week: 5 days    Minutes of Exercise per Session: 30 min  Stress: No Stress Concern Present (06/02/2021)   [place]    Feeling of Stress : Not at all  Social Connections: Moderately Isolated (06/02/2021)   Social Connection and Isolation Panel [NHANES]    Frequency of Communication with Friends and Family: More than three times a week    Frequency of Social Gatherings with Friends and Family: Three times a week    Attends Religious Services: Never    Active Member of Clubs or Organizations: Yes    Attends Archivist Meetings: More than 4 times per year    Marital Status: Widowed    Allergies:  Allergies  Allergen Reactions   Wellbutrin [Bupropion]     Patient reports made " deathly sick " years ago at appointment 03/18/20.   Lasix [Furosemide] Other (See Comments)    Electrolyte imbalance    Metabolic Disorder Labs: Lab Results  Component Value Date   HGBA1C 5.7 05/19/2021   MPG 108 04/08/2019   MPG 103 08/16/2017   No results found for: "PROLACTIN" Lab Results  Component Value Date   CHOL 151 02/10/2021   TRIG 124.0 02/10/2021   HDL 55.20 02/10/2021   CHOLHDL 3 02/10/2021   VLDL 24.8 02/10/2021   LDLCALC 71 02/10/2021   LDLCALC 79 08/21/2019   Lab Results  Component Value Date   TSH 1.15 04/28/2021   TSH 1.09 10/16/2019    Therapeutic Level Labs: No results found for: "LITHIUM" No results found for: "VALPROATE" No results found for: "CBMZ"  Current Medications: Current Outpatient Medications  Medication Sig Dispense Refill   acetaminophen (TYLENOL) 325 MG tablet Take 650 mg by mouth every 6 (six) hours as needed.     carvedilol (COREG) 25 MG tablet TAKE 1 TABLET BY MOUTH TWICE A DAY WITH MEAL. MONITOR HEART RATE 60 tablet 1   clotrimazole (LOTRIMIN) 1 % cream Apply 1 application  topically 2 (two) times daily as needed.     cyclobenzaprine (FLEXERIL) 5 MG tablet Take 5 mg by mouth 2 (two) times daily as needed for muscle spasms.      DULoxetine (CYMBALTA) 20 MG capsule TAKE 1 CAPSULE BY MOUTH DAILY WITH THE '30MG'$  CAPSULE 30 capsule 1   DULoxetine (CYMBALTA) 30 MG capsule Take 1 capsule (30 mg total) by mouth daily. Take along with 20 mg daily 30 capsule 1   gabapentin (NEURONTIN) 300 MG capsule Take 600 mg by mouth 2 (two) times daily.      glucose blood (ACCU-CHEK GUIDE) test strip TEST UP TO FOUR TIMES A DAY 100 each 0   HYDROcodone-acetaminophen (NORCO/VICODIN) 5-325 MG tablet Take 1 tablet by mouth 3 (three) times daily as needed for moderate pain.     lurasidone (LATUDA) 40 MG TABS tablet Take 0.5 tablets (20 mg total) by mouth 2 (two) times daily with a meal. 30 tablet 1   meloxicam (MOBIC) 15 MG tablet Take 15 mg by mouth daily.     omeprazole (PRILOSEC) 20 MG capsule TAKE 1 CAPSULE BY MOUTH DAILY AS NEEDED TAKE 30 MINUTES BEFORE BREAKFAST 90 capsule 1   Semaglutide, 1 MG/DOSE, (OZEMPIC, 1 MG/DOSE,) 4 MG/3ML SOPN INJECT 1 MG AS DIRECTED ONCE A WEEK. 3 mL 1   telmisartan (MICARDIS) 80 MG tablet Take 1 tablet (80 mg total) by mouth daily. 30 tablet 5   naloxone (NARCAN) nasal spray 4 mg/0.1 mL Place into the nose. (Patient not taking: Reported on 01/20/2022)     oxybutynin (DITROPAN-XL) 10 MG 24 hr tablet Take 1 tablet (10 mg total) by mouth daily. (Patient not taking: Reported on 01/20/2022) 30 tablet 11   predniSONE (DELTASONE) 10 MG tablet 6 day taper - Take as directed (Patient not taking: Reported on 01/20/2022)     predniSONE (STERAPRED UNI-PAK 21 TAB) 10 MG (21) TBPK tablet Take by mouth. (Patient not taking: Reported on 01/20/2022)     No current facility-administered medications for this visit.     Musculoskeletal: Strength & Muscle Tone:  UTA Gait & Station:  Seated Patient leans: N/A  Psychiatric Specialty Exam: Review of Systems  Musculoskeletal:   Positive for back pain.       Pain radiating down her leg, right sided  Psychiatric/Behavioral: Negative.    All other systems reviewed and are negative.   There were no vitals taken for this visit.There is no height or weight on file to calculate BMI.  General Appearance: Casual  Eye Contact:  Fair  Speech:  Clear and Coherent  Volume:  Normal  Mood:  Euthymic  Affect:  Congruent  Thought Process:  Goal Directed and Descriptions of Associations: Intact  Orientation:  Full (Time, Place, and Person)  Thought Content: Logical   Suicidal Thoughts:  No  Homicidal Thoughts:  No  Memory:  Immediate;   Fair Recent;   Fair Remote;   Fair  Judgement:  Fair  Insight:  Fair  Psychomotor Activity:  Normal  Concentration:  Concentration: Fair and Attention Span: Fair  Recall:  [employer] of Knowledge: Fair  Language: Fair  Akathisia:  No  Handed:  Right  AIMS (if indicated): done  Assets:  Communication Skills Desire for Improvement Housing Social Support  ADL's:  Intact  Cognition: WNL  Sleep:  Fair   Screenings: AIMS    Flowsheet Row Video Visit from 10/21/2021 in [place] Total Score 0      GAD-7    Flowsheet Row Video Visit from 01/20/2022 in [place] Video Visit from 04/22/2021 in [place] Office Visit from 11/06/2019 in [name] Office Visit from 09/06/2018 in [place]  Total GAD-7 Score '1 5 21 14      '$ PHQ2-9    Flowsheet Row Video Visit from 01/20/2022 in [place] Video Visit from 10/21/2021 in [place] Office Visit from 07/21/2021 in [place] Office Visit from 07/14/2021 in [place] from 06/02/2021 in [place]  PHQ-2 Total Score 0 '1 1 1 '$ 0  PHQ-9 Total Score -- 4 3 -- --       Flowsheet Row Video Visit from 01/20/2022 in [place] [facility] from 11/09/2021 in [place] Video Visit from 10/21/2021 in [place]  C-SSRS RISK CATEGORY Low Risk Low Risk Low Risk        Assessment and Plan: Susan [name] is a 62 year old Caucasian female who has a history of bipolar disorder, degenerative disc disease, multiple medical problems was evaluated by telemedicine today.  Patient is currently improved, will benefit from the following plan.  Plan Bipolar disorder type I depressed in remission Cymbalta 50 mg p.o. daily Latuda 40 mg p.o. daily at reduced dosage.  PTSD-stable Continue CBT as needed- Mr.Mostafa. Continue Cymbalta as prescribed  Tobacco use disorder-improving Provided counseling for 1 minute.  Alcohol use disorder in remission Continue AA meetings  Follow-up in clinic in 2 months or sooner if needed.   Collaboration of Care: Collaboration of Care: Other patient advised to continue [place] meetings.  Patient/Guardian was advised Release of Information must be obtained prior to any record release in order to collaborate their care with an outside provider. Patient/Guardian was advised if they have not already done so to contact the registration department to sign all necessary forms in order for [name] to release information regarding their care.   Consent: Patient/Guardian gives verbal consent for treatment and assignment of benefits for services provided during this visit. Patient/Guardian expressed understanding and agreed to proceed.   This note was generated in part or whole with voice recognition software. Voice recognition is usually quite accurate but there are transcription errors that can and very often do occur. I apologize for any typographical errors that were not detected and corrected.      Ursula Alert, MD 01/20/2022, 1:24 PM

## 2022-01-21 ENCOUNTER — Telehealth: Payer: Self-pay

## 2022-01-21 ENCOUNTER — Other Ambulatory Visit: Payer: Self-pay

## 2022-01-21 DIAGNOSIS — I10 Essential (primary) hypertension: Secondary | ICD-10-CM

## 2022-01-21 MED ORDER — CARVEDILOL 25 MG PO TABS: ORAL_TABLET | ORAL | 3 refills | Status: DC

## 2022-01-21 NOTE — Telephone Encounter (Signed)
Medication refill was sent to pharmacy.  Wynetta Seith,cma

## 2022-01-21 NOTE — Telephone Encounter (Signed)
Patient states she would like to request a refill for her carvedilol (COREG) 25 MG tablet.  Patient states she is out and it cannot be refilled until 01/27/22 or 01/28/22.  Patient states she has been out for three days and her blood pressure is going up.  Patient would like for you to please call when her prescription has been sent to the pharmacy.  Patient states she spoke with the pharmacy and they told her to call her PCP and that there are no refills.  Patient states pharmacy said there should be pills left, so the prescription may have been miscounted.  *Patient states her preferred pharmacy [facility] on the corner of [address].

## 2022-01-23 ENCOUNTER — Other Ambulatory Visit: Payer: Self-pay | Admitting: Psychiatry

## 2022-01-23 DIAGNOSIS — F3131 Bipolar disorder, current episode depressed, mild: Secondary | ICD-10-CM

## 2022-01-23 DIAGNOSIS — F4310 Post-traumatic stress disorder, unspecified: Secondary | ICD-10-CM

## 2022-02-07 ENCOUNTER — Ambulatory Visit: Payer: Medicare Other | Admitting: Internal Medicine

## 2022-02-12 ENCOUNTER — Other Ambulatory Visit: Payer: Self-pay | Admitting: Psychiatry

## 2022-02-12 DIAGNOSIS — F3176 Bipolar disorder, in full remission, most recent episode depressed: Secondary | ICD-10-CM

## 2022-02-20 ENCOUNTER — Other Ambulatory Visit: Payer: Self-pay | Admitting: Psychiatry

## 2022-02-20 DIAGNOSIS — F4310 Post-traumatic stress disorder, unspecified: Secondary | ICD-10-CM

## 2022-02-20 DIAGNOSIS — F3131 Bipolar disorder, current episode depressed, mild: Secondary | ICD-10-CM

## 2022-03-02 ENCOUNTER — Other Ambulatory Visit: Payer: Self-pay | Admitting: Family Medicine

## 2022-03-02 DIAGNOSIS — E119 Type 2 diabetes mellitus without complications: Secondary | ICD-10-CM

## 2022-03-03 ENCOUNTER — Ambulatory Visit (INDEPENDENT_AMBULATORY_CARE_PROVIDER_SITE_OTHER): Payer: Medicare Other | Admitting: Pulmonary Disease

## 2022-03-03 ENCOUNTER — Encounter: Payer: Self-pay | Admitting: Pulmonary Disease

## 2022-03-03 VITALS — BP 134/80 | HR 70 | Temp 98.4°F | Ht 62.0 in | Wt 214.4 lb

## 2022-03-03 DIAGNOSIS — Z23 Encounter for immunization: Secondary | ICD-10-CM

## 2022-03-03 DIAGNOSIS — G4733 Obstructive sleep apnea (adult) (pediatric): Secondary | ICD-10-CM

## 2022-03-03 DIAGNOSIS — Z720 Tobacco use: Secondary | ICD-10-CM | POA: Diagnosis not present

## 2022-03-03 DIAGNOSIS — J432 Centrilobular emphysema: Secondary | ICD-10-CM

## 2022-03-03 MED ORDER — ALBUTEROL SULFATE HFA 108 (90 BASE) MCG/ACT IN AERS: 2.0000 | INHALATION_SPRAY | Freq: Four times a day (QID) | RESPIRATORY_TRACT | 2 refills | Status: DC | PRN

## 2022-03-03 NOTE — Patient Instructions (Signed)
Flu shot today  Albuterol two puffs every 6 hours as needed for cough, wheeze, shortness of breath or chest congestion  Follow up in 6 months

## 2022-03-03 NOTE — Progress Notes (Signed)
[facility]  Chief Complaint  Patient presents with   Follow-up    Wearing cpap nightly.     Past Surgical History:  She  has a past surgical history that includes Breast surgery (Left, 20 yrs ago); Cesarean section; Bilateral salpingoophorectomy; Tonsillectomy; Tubal ligation; Total knee arthroplasty (Right, 05/10/2016); Breast biopsy (Left); Colonoscopy; Colonoscopy with propofol (N/A, 10/16/2017); Joint replacement (Right); Hernia repair (06/2011, July 2014); Hernia repair; Total hip arthroplasty (Left, 07/23/2019); and Colonoscopy with propofol (N/A, 02/16/2021).  Past Medical History:  ADD, ETOH, Anemia, Anxiety, RA, Bipolar, DJD, Depression, GERD, Suicidal ideation, Hep C, Diverticulitis, MRSA, HLD, HTN, Hypothyroidism, Osteoporosis, PTSD, Pre DM, Vit B12 deficiency, Vit D deficiency, COVID 24 February 2021  Constitutional:  BP 134/80 (BP Location: Left Arm, Cuff Size: Large)   Pulse 70   Temp 98.4 F (36.9 C) (Temporal)   Ht _0  (1.575 m)   Wt 214 lb 6.4 oz (97.3 kg)   SpO2 94%   BMI 39.21 kg/m   Brief Summary:  Susan [name] is a 61 y.o. female smoker with obstructive sleep apnea and COPD.      Subjective:   She tried trelegy, but didn't notice much difference.  She has albuterol and uses this intermittently.  This seems to help.  She is down to 1/2 ppd.  She is planning to use nicotine patch.  Her brother said he would buy her a car if she stops smoking.  Uses CPAP nightly.  Has full face mask.  No issues with sinus congestion, or sore throat.  Physical Exam:   Appearance - well kempt, using a walker  ENMT - no sinus tenderness, no oral exudate, no LAN, Mallampati 3 airway, no stridor  Respiratory - equal breath sounds bilaterally, no wheezing or rales  CV - s1s2 regular rate and rhythm, no murmurs  Ext - no clubbing, no edema  Skin - no rashes  Psych - normal mood and affect    Pulmonary testing:  PFT  02/14/19 >> FEV1 2.43 (96%), FEV1% 77, TLC 5.18 (103%), DLCO 80% PFT 07/08/21 >> FEV1 1.74 (73%), FEV1% 60, TLC 4.78 (100%), DLCO 72%  Chest Imaging:  CT angio chest 04/30/21 >> lungs clear  Sleep Tests:  PSG 11/04/14 >> AHI 72.9, SpO2 low 80.8% CPAP 12/04/14 >> CPAP 14 cm H2O ONO with CPAP 06/30/21 >> test time 7 hrs 59 min.  Basal SpO2 92.7%, low SpO2 79%.  Spent 4.8 min with an SpO2 < 88%. CPAP 01/31/22 to 03/01/22 >> used on 26 to 30 nights with average 9 hrs 31 min.  Average AHI 0.7 with CPAP 14 cm H2O  Cardiac Tests:  Echo 01/21/19 >> EF greater than 65%  Social History:  She  reports that she has been smoking cigarettes. She has a 61.50 pack-year smoking history. She has never used smokeless tobacco. She reports that she does not drink alcohol and does not use drugs.  Family History:  Her family history includes Alcohol abuse in her father, mother, sister, sister, son, and son; Arthritis in her brother and mother; Asthma in her mother; Bipolar disorder in her mother; COPD in her mother; Cancer in her brother; Congestive Heart Failure in her mother; Depression in her sister; Drug abuse in her daughter, sister, sister, son, and son; Eating disorder in her mother; Fibromyalgia in her sister; Heart attack in her father; Heart disease in her mother; Mental illness in her brother, mother, sister, sister, and sister; Obesity in her sister;  Pneumonia in her sister; Thyroid disease in her mother.     Assessment/Plan:   Obstructive sleep apnea. - she is compliant with CPAP and reports benefit from therapy - she uses [employer] for her DME - continue CPAP 14 cm H2O  COPD with emphysema. - mild symptoms - prn albuterol - flu shot today  Tobacco abuse. - reviewed options for smoking cessation - she will try nicotine patch - she is enrolled in LDCT scan lung cancer screening program  Time Spent Involved in Patient Care on Day of Examination:  36 minutes  Follow up:   Patient Instructions   Flu shot today  Albuterol two puffs every 6 hours as needed for cough, wheeze, shortness of breath or chest congestion  Follow up in 6 months  Medication List:   Allergies as of 03/03/2022       Reactions   Wellbutrin [bupropion]    Patient reports made " deathly sick " years ago at appointment 03/18/20.   Lasix [furosemide] Other (See Comments)   Electrolyte imbalance        Medication List        Accurate as of March 03, 2022 11:03 AM. If you have any questions, ask your nurse or doctor.          STOP taking these medications    predniSONE 10 MG (21) Tbpk tablet Commonly known as: STERAPRED UNI-PAK 21 TAB Stopped by: Chesley Mires, MD   predniSONE 10 MG tablet Commonly known as: DELTASONE Stopped by: Chesley Mires, MD       TAKE these medications    Accu-Chek Guide test strip Generic drug: glucose blood TEST UP TO FOUR TIMES A DAY   acetaminophen 325 MG tablet Commonly known as: TYLENOL Take 650 mg by mouth every 6 (six) hours as needed.   albuterol 108 (90 Base) MCG/ACT inhaler Commonly known as: VENTOLIN HFA Inhale 2 puffs into the lungs every 6 (six) hours as needed for wheezing or shortness of breath. Started by: Chesley Mires, MD   carvedilol 25 MG tablet Commonly known as: COREG TAKE 1 TABLET BY MOUTH TWICE A DAY WITH MEAL. MONITOR HEART RATE   clotrimazole 1 % cream Commonly known as: LOTRIMIN Apply 1 application topically 2 (two) times daily as needed.   cyclobenzaprine 5 MG tablet Commonly known as: FLEXERIL Take 5 mg by mouth 2 (two) times daily as needed for muscle spasms.   DULoxetine 30 MG capsule Commonly known as: CYMBALTA TAKE 1 CAPSULE BY MOUTH DAILY   DULoxetine 20 MG capsule Commonly known as: CYMBALTA TAKE 1 CAPSULE BY MOUTH EVERY DAY WITH 30 MG CAPSULE   gabapentin 300 MG capsule Commonly known as: NEURONTIN Take 600 mg by mouth 2 (two) times daily.   HYDROcodone-acetaminophen 5-325 MG tablet Commonly known as:  NORCO/VICODIN Take 1 tablet by mouth 3 (three) times daily as needed for moderate pain.   lurasidone 40 MG Tabs tablet Commonly known as: LATUDA TAKE 1/2 TABLET(20 MG) BY MOUTH TWICE DAILY WITH A MEAL   meloxicam 15 MG tablet Commonly known as: MOBIC Take 15 mg by mouth daily.   naloxone 4 MG/0.1ML Liqd nasal spray kit Commonly known as: NARCAN Place into the nose.   omeprazole 20 MG capsule Commonly known as: PRILOSEC TAKE 1 CAPSULE BY MOUTH DAILY AS NEEDED TAKE 30 MINUTES BEFORE BREAKFAST   oxybutynin 10 MG 24 hr tablet Commonly known as: DITROPAN-XL Take 1 tablet (10 mg total) by mouth daily.   Ozempic (1 MG/DOSE) 4 MG/3ML  Sopn Generic drug: Semaglutide (1 MG/DOSE) INJECT 1 MG AS DIRECTED ONCE A WEEK.   telmisartan 80 MG tablet Commonly known as: MICARDIS Take 1 tablet (80 mg total) by mouth daily.        Signature:  Chesley Mires, MD [place] Pager - [phone] 03/03/2022, 11:03 AM

## 2022-03-24 ENCOUNTER — Other Ambulatory Visit: Payer: Self-pay | Admitting: Psychiatry

## 2022-03-24 ENCOUNTER — Encounter: Payer: Self-pay | Admitting: Psychiatry

## 2022-03-24 ENCOUNTER — Telehealth (INDEPENDENT_AMBULATORY_CARE_PROVIDER_SITE_OTHER): Payer: Medicare Other | Admitting: Psychiatry

## 2022-03-24 DIAGNOSIS — F3131 Bipolar disorder, current episode depressed, mild: Secondary | ICD-10-CM

## 2022-03-24 DIAGNOSIS — F3176 Bipolar disorder, in full remission, most recent episode depressed: Secondary | ICD-10-CM | POA: Diagnosis not present

## 2022-03-24 DIAGNOSIS — F1021 Alcohol dependence, in remission: Secondary | ICD-10-CM

## 2022-03-24 DIAGNOSIS — F4310 Post-traumatic stress disorder, unspecified: Secondary | ICD-10-CM

## 2022-03-24 DIAGNOSIS — F17200 Nicotine dependence, unspecified, uncomplicated: Secondary | ICD-10-CM

## 2022-03-24 DIAGNOSIS — F1421 Cocaine dependence, in remission: Secondary | ICD-10-CM

## 2022-03-24 MED ORDER — DULOXETINE HCL 30 MG PO CPEP: 30.0000 mg | ORAL_CAPSULE | Freq: Every day | ORAL | 1 refills | Status: DC

## 2022-03-24 NOTE — Progress Notes (Signed)
Virtual Visit via Video Note  I connected with Susan [name] on 03/24/22 at  1:20 PM EST by a video enabled telemedicine application and verified that I am speaking with the correct person using two identifiers.  Location Provider Location : [facility] Patient Location : Home  Participants: Patient , Provider    I discussed the limitations of evaluation and management by telemedicine and the availability of in person appointments. The patient expressed understanding and agreed to proceed.   I discussed the assessment and treatment plan with the patient. The patient was provided an opportunity to ask questions and all were answered. The patient agreed with the plan and demonstrated an understanding of the instructions.   The patient was advised to call back or seek an in-person evaluation if the symptoms worsen or if the condition fails to improve as anticipated.   [place] MD OP Progress Note  03/24/2022 5:34 PM Susan [name]  MRN:  [id number]  Chief Complaint:  Chief Complaint  Patient presents with   Follow-up   Medication Refill   Anxiety   Depression   HPI: Susan [name] is a 61 year old Caucasian female, widowed, employed, lives in [place], has a history of bipolar disorder, alcohol and cocaine use disorder in remission, tobacco use disorder, PTSD, bereavement, degenerative disc disease, obstructive sleep apnea on CPAP, history of total hip replacement, osteoarthritis, hepatitis C was evaluated by telemedicine today.  Patient today reports she ran out of [place], she thought she did not have a refill at the pharmacy.  She reports it has been 2 weeks.  She has not noticed any worsening mood symptoms since being off of it.  She also feels like she has more energy than when she was taking it.  She would like to stay off of the [place] for a while to see if that will be okay.  Patient otherwise denies any significant mood lability, hypomanic, manic symptoms or anxiety.  Reports  sleep is overall good.  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  She reports hence it has been tricky sleeping during the day when she works nights.  However she has been doing overall okay.  Patient reports she celebrated 5 years of sobriety and continues to attend AA meetings 3 times a week.  Patient is compliant on her medications like Susan [name], denies side effects.  Patient denies any suicidality, homicidality or perceptual disturbances.  Continues to have hip joint pain on the right side, waiting to talk to her primary care provider.  Patient denies any other concerns today.    Visit Diagnosis:    ICD-10-CM   1. Bipolar 1 disorder, depressed, full remission ([place])  F31.76     2. PTSD (post-traumatic stress disorder)  F43.10 Susan [name] (CYMBALTA) 30 MG capsule    3. Tobacco use disorder  F17.200     4. Alcohol use disorder, severe, in sustained remission ([place])  F10.21     5. Cocaine use disorder, moderate, in sustained remission ([place])  F14.21       Past Psychiatric History: Reviewed past psychiatric history from progress note on 09/27/2019.  Past trials of Seroquel, Cymbalta, Abilify, risperidone.  Past Medical History:  Past Medical History:  Diagnosis Date   ADD (attention deficit disorder)    Alcohol abuse    Anemia    Anxiety    Aortic atherosclerosis ([place]) 02/20/2018   Chest CT Sept 2019   Arthritis    rheumatoid arthritis   Asthma  Bipolar disorder ([place])    Constipation    Degenerative disc disease at L5-S1 level 09/28/2016   See ortho note May 2018   Depression    bipolar, hx of suicide attempt   Diabetes mellitus, type 2 ([place])    Diverticulitis 2013   DOE (dyspnea on exertion)    Drug use    GERD (gastroesophageal reflux disease)    H/O suicide attempt    slit wrists   Hepatitis C 06/26/2014   Hip pain    History of echocardiogram    a. 04/2019 Echo: EF >65%, nl RV fxn.   History of MRSA infection 2013   HLD  (hyperlipidemia)    Hypertension    a. 06/2021 Renal Duplex: ? bilat RAS; b. 06/2021 CTA Abd: No signif RAS.   Hyponatremia    Hypothyroidism    Incisional hernia 11/08/2012   Knee pain    Multinodular thyroid    OSA (obstructive sleep apnea)    Osteoporosis    Palpitations    Post-traumatic osteoarthritis of right knee 09/24/2015   Recurrent ventral hernia 11/08/2012   Status post total right knee replacement using cement 05/10/2016   Vitamin B12 deficiency    Vitamin D deficiency disease     Past Surgical History:  Procedure Laterality Date   BILATERAL SALPINGOOPHORECTOMY     due to abnormal mass   BREAST BIOPSY Left    neg   BREAST SURGERY Left 20 yrs ago   CESAREAN SECTION     COLONOSCOPY     COLONOSCOPY WITH PROPOFOL N/A 10/16/2017   Procedure: COLONOSCOPY WITH PROPOFOL;  Surgeon: Jonathon Bellows, MD;  Location: [facility] ENDOSCOPY;  Service: Gastroenterology;  Laterality: N/A;   COLONOSCOPY WITH PROPOFOL N/A 02/16/2021   Procedure: COLONOSCOPY WITH PROPOFOL;  Surgeon: Jonathon Bellows, MD;  Location: [facility] ENDOSCOPY;  Service: Gastroenterology;  Laterality: N/A;   HERNIA REPAIR  06/2011, July 2014   Ventral wall repair with Physiomesh   HERNIA REPAIR     2nd.vental wall repair   JOINT REPLACEMENT Right    knee   TONSILLECTOMY     TOTAL HIP ARTHROPLASTY Left 07/23/2019   Procedure: TOTAL HIP ARTHROPLASTY;  Surgeon: Corky Mull, MD;  Location: [facility] ORS;  Service: Orthopedics;  Laterality: Left;   TOTAL KNEE ARTHROPLASTY Right 05/10/2016   Procedure: TOTAL KNEE ARTHROPLASTY;  Surgeon: Corky Mull, MD;  Location: [facility] ORS;  Service: Orthopedics;  Laterality: Right;   TUBAL LIGATION      Family Psychiatric History: Reviewed family psychiatric history from progress note on 09/27/2019.  Family History:  Family History  Problem Relation Age of Onset   Arthritis Mother    Asthma Mother    Mental illness Mother    Thyroid disease Mother    COPD Mother    Heart disease Mother     Congestive Heart Failure Mother    Alcohol abuse Mother    Eating disorder Mother    Bipolar disorder Mother    Alcohol abuse Father    Heart attack Father    Alcohol abuse Sister    Drug abuse Sister    Mental illness Sister    Mental illness Sister    Fibromyalgia Sister    Obesity Sister    Pneumonia Sister    Depression Sister    Mental illness Sister    Alcohol abuse Sister    Drug abuse Sister    Arthritis Brother    Mental illness Brother    Cancer Brother  non-hodkins lymphoma   Drug abuse Daughter    Drug abuse Son    Alcohol abuse Son    Drug abuse Son    Alcohol abuse Son    Diabetes Neg Hx    Stroke Neg Hx    Breast cancer Neg Hx    Thyroid cancer Neg Hx     Social History: Reviewed social history from progress note on 09/27/2019. Social History   Socioeconomic History   Marital status: Widowed    Spouse name: Not on file   Number of children: 3   Years of education: Not on file   Highest education level: Some college, no degree  Occupational History   Occupation: disabled  Tobacco Use   Smoking status: Every Day    Packs/day: 1.50    Years: 41.00    Total pack years: 61.50    Types: Cigarettes   Smokeless tobacco: Never   Tobacco comments:    1ppd 03/03/2022  Vaping Use   Vaping Use: Former   Quit date: 07/15/2017  Substance and Sexual Activity   Alcohol use: No    Alcohol/week: 0.0 standard drinks of 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   Drug use: No    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   Sexual activity: Not Currently  Other Topics Concern   Not on file  Social History Narrative   Not on file   Social Determinants of Health   Financial Resource Strain: Low Risk  (06/02/2021)   Overall Financial Resource Strain (CARDIA)    Difficulty of Paying Living Expenses: Not hard at all  Food Insecurity: No Food Insecurity (06/02/2021)   Hunger Vital Sign    Worried About Running Out of Food in the Last Year:  Never true    [place] in the Last Year: Never true  Transportation Needs: No Transportation Needs (06/02/2021)   PRAPARE - Hydrologist (Medical): No    Lack of Transportation (Non-Medical): No  Physical Activity: Sufficiently Active (06/02/2021)   Exercise Vital Sign    Days of Exercise per Week: 5 days    Minutes of Exercise per Session: 30 min  Stress: No Stress Concern Present (06/02/2021)   [place]    Feeling of Stress : Not at all  Social Connections: Moderately Isolated (06/02/2021)   Social Connection and Isolation Panel [NHANES]    Frequency of Communication with Friends and Family: More than three times a week    Frequency of Social Gatherings with Friends and Family: Three times a week    Attends Religious Services: Never    Active Member of Clubs or Organizations: Yes    Attends Archivist Meetings: More than 4 times per year    Marital Status: Widowed    Allergies:  Allergies  Allergen Reactions   Wellbutrin [Bupropion]     Patient reports made " deathly sick " years ago at appointment 03/18/20.   Lasix [Furosemide] Other (See Comments)    Electrolyte imbalance    Metabolic Disorder Labs: Lab Results  Component Value Date   HGBA1C 5.7 05/19/2021   MPG 108 04/08/2019   MPG 103 08/16/2017   No results found for: "PROLACTIN" Lab Results  Component Value Date   CHOL 151 02/10/2021   TRIG 124.0 02/10/2021   HDL 55.20 02/10/2021   CHOLHDL 3 02/10/2021   VLDL 24.8 02/10/2021   LDLCALC 71 02/10/2021  [place] 79 08/21/2019   Lab Results  Component Value Date   TSH 1.15 04/28/2021   TSH 1.09 10/16/2019    Therapeutic Level Labs: No results found for: "LITHIUM" No results found for: "VALPROATE" No results found for: "CBMZ"  Current Medications: Current Outpatient Medications  Medication Sig Dispense Refill   acetaminophen (TYLENOL) 325 MG  tablet Take 650 mg by mouth every 6 (six) hours as needed.     albuterol (VENTOLIN HFA) 108 (90 Base) MCG/ACT inhaler Inhale 2 puffs into the lungs every 6 (six) hours as needed for wheezing or shortness of breath. 8 g 2   carvedilol (COREG) 25 MG tablet TAKE 1 TABLET BY MOUTH TWICE A DAY WITH MEAL. MONITOR HEART RATE 60 tablet 3   clotrimazole (LOTRIMIN) 1 % cream Apply 1 application topically 2 (two) times daily as needed.     cyclobenzaprine (FLEXERIL) 5 MG tablet Take 5 mg by mouth 2 (two) times daily as needed for muscle spasms.      Susan [name] (CYMBALTA) 20 MG capsule TAKE 1 CAPSULE BY MOUTH EVERY DAY WITH 30 MG CAPSULE 30 capsule 1   gabapentin (NEURONTIN) 300 MG capsule Take 600 mg by mouth 2 (two) times daily.      glucose blood (ACCU-CHEK GUIDE) test strip TEST UP TO FOUR TIMES A DAY 100 each 0   HYDROcodone-acetaminophen (NORCO/VICODIN) 5-325 MG tablet Take 1 tablet by mouth 3 (three) times daily as needed for moderate pain.     meloxicam (MOBIC) 15 MG tablet Take 15 mg by mouth daily.     naloxone (NARCAN) nasal spray 4 mg/0.1 mL Place into the nose.     omeprazole (PRILOSEC) 20 MG capsule TAKE 1 CAPSULE BY MOUTH DAILY AS NEEDED TAKE 30 MINUTES BEFORE BREAKFAST 90 capsule 1   oxybutynin (DITROPAN-XL) 10 MG 24 hr tablet Take 1 tablet (10 mg total) by mouth daily. 30 tablet 11   telmisartan (MICARDIS) 80 MG tablet Take 1 tablet (80 mg total) by mouth daily. 30 tablet 5   Susan [name] (CYMBALTA) 30 MG capsule Take 1 capsule (30 mg total) by mouth daily. 30 capsule 1   lurasidone (LATUDA) 40 MG TABS tablet TAKE 1/2 TABLET(20 MG) BY MOUTH TWICE DAILY WITH A MEAL (Patient not taking: Reported on 03/24/2022) 30 tablet 1   Semaglutide, 1 MG/DOSE, (OZEMPIC, 1 MG/DOSE,) 4 MG/3ML SOPN INJECT 1 MG UNDER THE SKIN ONCE A WEEK (Patient not taking: Reported on 03/24/2022) 3 mL 1   No current facility-administered medications for this visit.     Musculoskeletal: Strength & Muscle Tone:  UTA Gait &  Station:  Seated Patient leans: N/A  Psychiatric Specialty Exam: Review of Systems  Musculoskeletal:        Rt.hip pain - chronic  Psychiatric/Behavioral: Negative.    All other systems reviewed and are negative.   There were no vitals taken for this visit.There is no height or weight on file to calculate BMI.  General Appearance: Casual  Eye Contact:  Fair  Speech:  Clear and Coherent  Volume:  Normal  Mood:  Euthymic  Affect:  Congruent  Thought Process:  Goal Directed and Descriptions of Associations: Intact  Orientation:  Full (Time, Place, and Person)  Thought Content: Logical   Suicidal Thoughts:  No  Homicidal Thoughts:  No  Memory:  Immediate;   Fair Recent;   Fair Remote;   Fair  Judgement:  Fair  Insight:  Fair  Psychomotor Activity:  Normal  Concentration:  Concentration: Fair and Attention Span:  Fair  Recall:  Sm[place] of Knowledge: Fair  Language: Fair  Akathisia:  No  Handed:  Right  AIMS (if indicated): done  Assets:  Communication Skills Desire for [place] Talents/Skills Transportation  ADL's:  Intact  Cognition: WNL  Sleep:  Fair   Screenings: AIMS    Flowsheet Row Video Visit from 03/24/2022 in [place] Video Visit from 01/20/2022 in [place] Video Visit from 10/21/2021 in [place] Total Score 0 0 0      GAD-7    Flowsheet Row Video Visit from 01/20/2022 in [place] Video Visit from 04/22/2021 in [place] from 11/06/2019 in [facility] Office Visit from 09/06/2018 in [place]  Total GAD-7 Score '1 5 21 14      '$ PHQ2-9    Flowsheet Row Video Visit from 03/24/2022 in [place] Video Visit from 01/20/2022 in [place] Video Visit from  10/21/2021 in [place] Office Visit from 07/21/2021 in [place] Office Visit from 07/14/2021 in [place]  PHQ-2 Total Score 0 0 '1 1 1  '$ PHQ-9 Total Score -- -- 4 3 --      Flowsheet Row Video Visit from 03/24/2022 in [place] Video Visit from 01/20/2022 in [place] ED from 11/09/2021 in [place] CATEGORY No Risk Low Risk Low Risk        Assessment and Plan: NAKEETA SEBASTIANI is a 61 year old Caucasian female who has a history of bipolar disorder, degenerative disc disease, multiple medical problems was evaluated by telemedicine today.  Patient currently noncompliant with Latuda would like to stay off of it and monitor herself, otherwise doing fairly well, will benefit from the following plan.  Plan Bipolar disorder type I depressed in remission Cymbalta 50 mg p.o. daily Hold Latuda for now.  Patient advised to restart taking Latuda if she notices any worsening mood symptoms.  She does have a prescription available at the pharmacy. Continue Gabapentin although prescribed for pain is also a mood stabilizer.  PTSD-stable Continue CBT as needed-Mr. Mostafa. Continue Cymbalta 50 mg p.o. daily  Tobacco use disorder-improving Provided counseling for 1 minute.  Alcohol use disorder/cocaine use disorder in remission Patient celebrated 5 years of sobriety. Continue AA meetings.  Follow-up in clinic in 2 months or sooner if needed.   Collaboration of Care: Collaboration of Care: Primary Care Provider AEB encouraged to follow up with primary care provider for her pain  Patient/Guardian was advised Release of Information must be obtained prior to any record release in order to collaborate their care with an outside provider. Patient/Guardian was advised if they have not already done so to contact  the registration department to sign all necessary forms in order for [name] to release information regarding their care.   Consent: Patient/Guardian gives verbal consent for treatment and assignment of benefits for services provided during this visit. Patient/Guardian expressed understanding and agreed to proceed.   This note was generated in part or whole with voice recognition software. Voice recognition is usually quite accurate but there are transcription errors that can and very often do occur. I apologize for any typographical errors that were not detected and corrected.      Ursula Alert, MD 03/24/2022, 5:34 PM

## 2022-04-20 ENCOUNTER — Other Ambulatory Visit: Payer: Self-pay | Admitting: Physical Medicine and Rehabilitation

## 2022-04-20 DIAGNOSIS — M5416 Radiculopathy, lumbar region: Secondary | ICD-10-CM

## 2022-04-21 ENCOUNTER — Other Ambulatory Visit: Payer: Self-pay | Admitting: Psychiatry

## 2022-04-21 DIAGNOSIS — F3131 Bipolar disorder, current episode depressed, mild: Secondary | ICD-10-CM

## 2022-04-21 DIAGNOSIS — F4310 Post-traumatic stress disorder, unspecified: Secondary | ICD-10-CM

## 2022-05-05 ENCOUNTER — Other Ambulatory Visit: Payer: Self-pay | Admitting: Family Medicine

## 2022-05-05 DIAGNOSIS — I10 Essential (primary) hypertension: Secondary | ICD-10-CM

## 2022-05-18 ENCOUNTER — Ambulatory Visit
Admission: RE | Admit: 2022-05-18 | Discharge: 2022-05-18 | Disposition: A | Discharge status: Home / Self Care | Payer: 59 | Source: Ambulatory Visit | Attending: Physical Medicine and Rehabilitation | Admitting: Physical Medicine and Rehabilitation

## 2022-05-18 DIAGNOSIS — M4807 Spinal stenosis, lumbosacral region: Secondary | ICD-10-CM | POA: Diagnosis not present

## 2022-05-18 DIAGNOSIS — M5416 Radiculopathy, lumbar region: Secondary | ICD-10-CM

## 2022-05-18 DIAGNOSIS — M545 Low back pain, unspecified: Secondary | ICD-10-CM | POA: Diagnosis not present

## 2022-05-18 DIAGNOSIS — R200 Anesthesia of skin: Secondary | ICD-10-CM | POA: Diagnosis not present

## 2022-05-18 DIAGNOSIS — M48061 Spinal stenosis, lumbar region without neurogenic claudication: Secondary | ICD-10-CM | POA: Diagnosis not present

## 2022-05-20 ENCOUNTER — Other Ambulatory Visit: Payer: Medicare Other

## 2022-05-20 ENCOUNTER — Other Ambulatory Visit: Payer: Self-pay | Admitting: Family Medicine

## 2022-05-20 DIAGNOSIS — I10 Essential (primary) hypertension: Secondary | ICD-10-CM

## 2022-05-20 NOTE — Telephone Encounter (Signed)
Patient needs to set up follow-up.  Refill sent to pharmacy.

## 2022-05-20 NOTE — Telephone Encounter (Signed)
Refilled: 01/21/2022 Last OV: 07/14/2021 with Sharyn Lull [place] Next OV: not scheduled

## 2022-05-25 ENCOUNTER — Encounter: Payer: Self-pay | Admitting: Psychiatry

## 2022-05-25 ENCOUNTER — Telehealth (INDEPENDENT_AMBULATORY_CARE_PROVIDER_SITE_OTHER): Payer: 59 | Admitting: Psychiatry

## 2022-05-25 DIAGNOSIS — F17210 Nicotine dependence, cigarettes, uncomplicated: Secondary | ICD-10-CM

## 2022-05-25 DIAGNOSIS — F1421 Cocaine dependence, in remission: Secondary | ICD-10-CM

## 2022-05-25 DIAGNOSIS — F17200 Nicotine dependence, unspecified, uncomplicated: Secondary | ICD-10-CM

## 2022-05-25 DIAGNOSIS — F3176 Bipolar disorder, in full remission, most recent episode depressed: Secondary | ICD-10-CM | POA: Diagnosis not present

## 2022-05-25 DIAGNOSIS — F1021 Alcohol dependence, in remission: Secondary | ICD-10-CM

## 2022-05-25 DIAGNOSIS — F4310 Post-traumatic stress disorder, unspecified: Secondary | ICD-10-CM

## 2022-05-25 NOTE — Progress Notes (Signed)
Virtual Visit via Video Note  I connected with Susan [name] on 05/25/22 at  4:30 PM EST by a video enabled telemedicine application and verified that I am speaking with the correct person using two identifiers.  Location Provider Location : [facility] Patient Location : Home  Participants: Patient , Provider   I discussed the limitations of evaluation and management by telemedicine and the availability of in person appointments. The patient expressed understanding and agreed to proceed.   I discussed the assessment and treatment plan with the patient. The patient was provided an opportunity to ask questions and all were answered. The patient agreed with the plan and demonstrated an understanding of the instructions.   The patient was advised to call back or seek an in-person evaluation if the symptoms worsen or if the condition fails to improve as anticipated.    [facility] MD OP Progress Note  05/26/2022 8:53 AM Susan [name]  MRN:  [id number]  Chief Complaint:  Chief Complaint  Patient presents with   Follow-up   Medication Refill   Anxiety   Depression   HPI: Susan [name] is a 62 year old Caucasian female, widowed, unemployed, lives in [place], has a history of bipolar disorder, alcohol and cocaine use disorder in remission, tobacco use disorder, PTSD, bereavement, degenerative disc disease, obstructive sleep apnea on CPAP, history of total hip replacement, osteoarthritis, hepatitis C was evaluated by telemedicine today.  Patient today reports she recently quit her job.  She reports she was not making enough for the work she was doing.  She reports it was a week ago.  She is still adjusting to the normal schedule with her sleep.  Trying to go to bed at night.  She used to work third shift when she was working and this is an adjustment.  She however has been doing okay so far although she continues to feel tired during the day.  She has been making sure she does not take any naps  during the day.  Patient reports although she has not been taking Latuda  currently doing fairly well with regards to her mood.  Denies any mood swings.  Patient denies any suicidality, homicidality or perceptual disturbances.  Patient continues to attend AA meetings.  Currently trying to quit smoking.  Does have patches available.  Denies any other concerns today.  Visit Diagnosis:    ICD-10-CM   1. Bipolar 1 disorder, depressed, full remission ([place])  F31.76     2. PTSD (post-traumatic stress disorder)  F43.10     3. Tobacco use disorder  F17.200     4. Alcohol use disorder, severe, in sustained remission ([place])  F10.21     5. Cocaine use disorder, moderate, in sustained remission ([place])  F14.21       Past Psychiatric History: Reviewed past psychiatric history from progress note on 09/27/2019.  Past trials of Seroquel, Cymbalta, Abilify, risperidone.  Past Medical History:  Past Medical History:  Diagnosis Date   ADD (attention deficit disorder)    Alcohol abuse    Anemia    Anxiety    Aortic atherosclerosis ([place]) 02/20/2018   Chest CT Sept 2019   Arthritis    rheumatoid arthritis   Asthma    Bipolar disorder (HCC)    Constipation    Degenerative disc disease at L5-S1 level 09/28/2016   See ortho note May 2018   Depression    bipolar, hx of suicide attempt   Diabetes mellitus, type 2 ([place])    Diverticulitis  2013   DOE (dyspnea on exertion)    Drug use    GERD (gastroesophageal reflux disease)    H/O suicide attempt    slit wrists   Hepatitis C 06/26/2014   Hip pain    History of echocardiogram    a. 04/2019 Echo: EF >65%, nl RV fxn.   History of MRSA infection 2013   HLD (hyperlipidemia)    Hypertension    a. 06/2021 Renal Duplex: ? bilat RAS; b. 06/2021 CTA Abd: No signif RAS.   Hyponatremia    Hypothyroidism    Incisional hernia 11/08/2012   Knee pain    Multinodular thyroid    OSA (obstructive sleep apnea)    Osteoporosis    Palpitations     Post-traumatic osteoarthritis of right knee 09/24/2015   Recurrent ventral hernia 11/08/2012   Status post total right knee replacement using cement 05/10/2016   Vitamin B12 deficiency    Vitamin D deficiency disease     Past Surgical History:  Procedure Laterality Date   BILATERAL SALPINGOOPHORECTOMY     due to abnormal mass   BREAST BIOPSY Left    neg   BREAST SURGERY Left 20 yrs ago   CESAREAN SECTION     COLONOSCOPY     COLONOSCOPY WITH PROPOFOL N/A 10/16/2017   Procedure: COLONOSCOPY WITH PROPOFOL;  Surgeon: Jonathon Bellows, MD;  Location[facility] ENDOSCOPY;  Service: Gastroenterology;  Laterality: N/A;   COLONOSCOPY WITH PROPOFOL N/A 02/16/2021   Procedure: COLONOSCOPY WITH PROPOFOL;  Surgeon: Jonathon Bellows, MD;  Location: [facility] ENDOSCOPY;  Service: Gastroenterology;  Laterality: N/A;   HERNIA REPAIR  06/2011, July 2014   Ventral wall repair with Physiomesh   HERNIA REPAIR     2nd.vental wall repair   JOINT REPLACEMENT Right    knee   TONSILLECTOMY     TOTAL HIP ARTHROPLASTY Left 07/23/2019   Procedure: TOTAL HIP ARTHROPLASTY;  Surgeon: Corky Mull, MD;  Location: [facility] ORS;  Service: Orthopedics;  Laterality: Left;   TOTAL KNEE ARTHROPLASTY Right 05/10/2016   Procedure: TOTAL KNEE ARTHROPLASTY;  Surgeon: Corky Mull, MD;  Location: [facility] ORS;  Service: Orthopedics;  Laterality: Right;   TUBAL LIGATION      Family Psychiatric History: Reviewed family psychiatric history from progress note on 09/27/2019.  Family History:  Family History  Problem Relation Age of Onset   Arthritis Mother    Asthma Mother    Mental illness Mother    Thyroid disease Mother    COPD Mother    Heart disease Mother    Congestive Heart Failure Mother    Alcohol abuse Mother    Eating disorder Mother    Bipolar disorder Mother    Alcohol abuse Father    Heart attack Father    Alcohol abuse Sister    Drug abuse Sister    Mental illness Sister    Mental illness Sister    Fibromyalgia Sister     Obesity Sister    Pneumonia Sister    Depression Sister    Mental illness Sister    Alcohol abuse Sister    Drug abuse Sister    Arthritis Brother    Mental illness Brother    Cancer Brother        non-hodkins lymphoma   Drug abuse Daughter    Drug abuse Son    Alcohol abuse Son    Drug abuse Son    Alcohol abuse Son    Diabetes Neg Hx    Stroke Neg Hx  Breast cancer Neg Hx    Thyroid cancer Neg Hx     Social History: Reviewed social history from progress note on 09/27/2019. Social History   Socioeconomic History   Marital status: Widowed    Spouse name: Not on file   Number of children: 3   Years of education: Not on file   Highest education level: Some college, no degree  Occupational History   Occupation: disabled  Tobacco Use   Smoking status: Every Day    Packs/day: 1.50    Years: 41.00    Total pack years: 61.50    Types: Cigarettes   Smokeless tobacco: Never   Tobacco comments:    1ppd 03/03/2022  Vaping Use   Vaping Use: Former   Quit date: 07/15/2017  Substance and Sexual Activity   Alcohol use: No    Alcohol/week: 0.0 standard drinks of 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   Drug use: No    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   Sexual activity: Not Currently  Other Topics Concern   Not on file  Social History Narrative   Not on file   Social Determinants of Health   Financial Resource Strain: Low Risk  (06/02/2021)   Overall Financial Resource Strain (CARDIA)    Difficulty of Paying Living Expenses: Not hard at all  Food Insecurity: No Food Insecurity (06/02/2021)   Hunger Vital Sign    Worried About Running Out of Food in the Last Year: Never true    [place] in the Last Year: Never true  Transportation Needs: No Transportation Needs (06/02/2021)   PRAPARE - Hydrologist (Medical): No    Lack of Transportation (Non-Medical): No  Physical Activity: Sufficiently Active (06/02/2021)    Exercise Vital Sign    Days of Exercise per Week: 5 days    Minutes of Exercise per Session: 30 min  Stress: No Stress Concern Present (06/02/2021)   [place]    Feeling of Stress : Not at all  Social Connections: Moderately Isolated (06/02/2021)   Social Connection and Isolation Panel [NHANES]    Frequency of Communication with Friends and Family: More than three times a week    Frequency of Social Gatherings with Friends and Family: Three times a week    Attends Religious Services: Never    Active Member of Clubs or Organizations: Yes    Attends Archivist Meetings: More than 4 times per year    Marital Status: Widowed    Allergies:  Allergies  Allergen Reactions   Wellbutrin [Bupropion]     Patient reports made " deathly sick " years ago at appointment 03/18/20.   Lasix [Furosemide] Other (See Comments)    Electrolyte imbalance    Metabolic Disorder Labs: Lab Results  Component Value Date   HGBA1C 5.7 05/19/2021   MPG 108 04/08/2019   MPG 103 08/16/2017   No results found for: "PROLACTIN" Lab Results  Component Value Date   CHOL 151 02/10/2021   TRIG 124.0 02/10/2021   HDL 55.20 02/10/2021   CHOLHDL 3 02/10/2021   VLDL 24.8 02/10/2021   LDLCALC 71 02/10/2021   LDLCALC 79 08/21/2019   Lab Results  Component Value Date   TSH 1.15 04/28/2021   TSH 1.09 10/16/2019    Therapeutic Level Labs: No results found for: "LITHIUM" No results found for: "VALPROATE" No results found for: "CBMZ"  Current  Medications: Current Outpatient Medications  Medication Sig Dispense Refill   acetaminophen (TYLENOL) 325 MG tablet Take 650 mg by mouth every 6 (six) hours as needed.     albuterol (VENTOLIN HFA) 108 (90 Base) MCG/ACT inhaler Inhale 2 puffs into the lungs every 6 (six) hours as needed for wheezing or shortness of breath. 8 g 2   carvedilol (COREG) 25 MG tablet TAKE 1 TABLET BY MOUTH TWICE DAILY  WITH MEAL. MONITOR HEART RATE 60 tablet 3   clotrimazole (LOTRIMIN) 1 % cream Apply 1 application topically 2 (two) times daily as needed.     cyclobenzaprine (FLEXERIL) 5 MG tablet Take 5 mg by mouth 2 (two) times daily as needed for muscle spasms.      DULoxetine (CYMBALTA) 20 MG capsule TAKE 1 CAPSULE BY MOUTH DAILY WITH 30 MG CAPSULE. 30 capsule 1   DULoxetine (CYMBALTA) 30 MG capsule Take 1 capsule (30 mg total) by mouth daily. 30 capsule 1   gabapentin (NEURONTIN) 300 MG capsule Take 600 mg by mouth 2 (two) times daily.      glucose blood (ACCU-CHEK GUIDE) test strip TEST UP TO FOUR TIMES A DAY 100 each 0   HYDROcodone-acetaminophen (NORCO/VICODIN) 5-325 MG tablet Take 1 tablet by mouth 3 (three) times daily as needed for moderate pain.     meloxicam (MOBIC) 15 MG tablet Take 15 mg by mouth daily.     naloxone (NARCAN) nasal spray 4 mg/0.1 mL Place into the nose.     telmisartan (MICARDIS) 80 MG tablet TAKE 1 TABLET(80 MG) BY MOUTH DAILY 90 tablet 1   No current facility-administered medications for this visit.     Musculoskeletal: Strength & Muscle Tone:  UTA Gait & Station:  Seated Patient leans: N/A  Psychiatric Specialty Exam: Review of Systems  Musculoskeletal:  Positive for back pain.       Leg numbness - right  Psychiatric/Behavioral: Negative.    All other systems reviewed and are negative.   There were no vitals taken for this visit.There is no height or weight on file to calculate BMI.  General Appearance: Casual  Eye Contact:  Fair  Speech:  Clear and Coherent  Volume:  Normal  Mood:  Euthymic  Affect:  Congruent  Thought Process:  Goal Directed and Descriptions of Associations: Intact  Orientation:  Full (Time, Place, and Person)  Thought Content: Logical   Suicidal Thoughts:  No  Homicidal Thoughts:  No  Memory:  Immediate;   Fair Recent;   Fair Remote;   Fair  Judgement:  Fair  Insight:  Fair  Psychomotor Activity:  Normal  Concentration:   Concentration: Fair and Attention Span: Fair  Recall:  [employer] of Knowledge: Fair  Language: Fair  Akathisia:  No  Handed:  Right  AIMS (if indicated): not done  Assets:  Communication Skills Desire for Improvement Housing Social Support  ADL's:  Intact  Cognition: WNL  Sleep:  Fair   Screenings: AIMS    Flowsheet Row Video Visit from 03/24/2022 in [place] Video Visit from 01/20/2022 in [place] Video Visit from 10/21/2021 in [place] Total Score 0 0 0      GAD-7    Flowsheet Row Video Visit from 01/20/2022 in [place] Video Visit from 04/22/2021 in [place] Office Visit from 11/06/2019 in [facility] Office Visit from 09/06/2018 in [place]  Total GAD-7 Score 1 5  21 14      PHQ2-9    Flowsheet Row Video Visit from 05/25/2022 in [place] Video Visit from 03/24/2022 in [place] Video Visit from 01/20/2022 in [place] Video Visit from 10/21/2021 in [place] Office Visit from 07/21/2021 in [place]  PHQ-2 Total Score 0 0 0 1 1  PHQ-9 Total Score -- -- -- 4 3      Flowsheet Row Video Visit from 05/25/2022 in [place] Video Visit from 03/24/2022 in [place] Video Visit from 01/20/2022 in [place] CATEGORY Low Risk No Risk Low Risk        Assessment and Plan: ATZIRY BARANSKI is a 62 year old Caucasian female who has a history of bipolar disorder, multiple medical problems was evaluated by telemedicine today.  Patient is currently stable.  Plan Bipolar disorder type I depressed in remission Cymbalta 50 mg p.o.  daily Gabapentin as prescribed for pain which is also a mood stabilizer.  PTSD-stable Cymbalta 50 mg p.o. daily Continue CBT as needed  Tobacco disorder-improving Patient is currently on nicotine replacement therapy.  Alcohol use disorder/cocaine use disorder in remission Patient sober since the past 5 years.  Continues to go for [name].  Follow-up in clinic in 3 months or sooner if needed in person.  Collaboration of Care: Collaboration of Care: Other encouraged to continue [place] meetings.  Patient/Guardian was advised Release of Information must be obtained prior to any record release in order to collaborate their care with an outside provider. Patient/Guardian was advised if they have not already done so to contact the registration department to sign all necessary forms in order for [name] to release information regarding their care.   Consent: Patient/Guardian gives verbal consent for treatment and assignment of benefits for services provided during this visit. Patient/Guardian expressed understanding and agreed to proceed.   This note was generated in part or whole with voice recognition software. Voice recognition is usually quite accurate but there are transcription errors that can and very often do occur. I apologize for any typographical errors that were not detected and corrected.      Ursula Alert, MD 05/26/2022, 8:53 AM

## 2022-05-26 ENCOUNTER — Other Ambulatory Visit: Payer: Self-pay | Admitting: Psychiatry

## 2022-05-26 DIAGNOSIS — F3176 Bipolar disorder, in full remission, most recent episode depressed: Secondary | ICD-10-CM

## 2022-06-01 NOTE — Progress Notes (Unsigned)
Referring Physician:  Sharlet Salina, MD [place],  [place] [id number]  Primary Physician:  Leone Haven, MD  History of Present Illness: 06/02/2022 Susan [name] is here today with a chief complaint of several years of slowly worsening back pain.  Her most pertinent issue today is pain down her right leg prickly when she stands or walks.  She has pain to her lateral thigh and lateral calf.  Her pain is around her ankle.  It is very bad when she stands in 1 position.  It also limits her ability to walk significant distances.  Standing, walking, doing chores, stairs, and physical activity make it worse.  Medications help.  She is tried injections in the past but they are no longer working.  She saw physical therapy in September which helped somewhat.  Bowel/Bladder Dysfunction: none  Conservative measures:  Physical therapy:  has participated in at [facility] from 01/17/22 to 01/31/22 Multimodal medical therapy including regular antiinflammatories:  aleve, celebrex, mobic, gabapentin, tramadol, cymbalta, hydrocodone, tylenol Injections:  has received epidural steroid injections 03/11/2022: Right hip joint injection (moderate relief) 01/11/2022: Right L3-4 and right L5-S1 transforaminal ESI (over 60% relief) 06/07/2021: Right trochanteric bursa injection (moderate relief) 02/09/2021: Right L3-4 and right L5-S1 transforaminal ESI (moderate to good relief) 01/06/2021: Right L4-5 and right L5-S1 transforaminal ESI (good relief x 3-4 days) 12/24/2020: Right L4 trigger point injection (mild relief) 07/14/2020: RFA to the bilateral L4-5 and L5-S1 facet joints (90% relief) 06/26/2020: MBB to the bilateral L4-5 and L5-S1 facet joints (7/10 to 0/10) 06/02/2020: MBB to the bilateral L4-5 and L5-S1 facet joints (10/10 to 0/10) 02/17/2020: Right L3 trigger point injection (moderate to good relief) 07/23/2019: Left hip replacement (Dr. Roland Rack)  05/22/2019: Left  L2-3 and left L4-5 transforaminal ESI (helped L4 pain, persistenet hip joint pain) 05/13/2019: Left hip joint injection under ultrasound (mild to moderate relief x 3 days) 02/28/2019: Left hip joint injection under ultrasound (moderate relief) 12/10/2018: Right subacromial injection (80% relief) 05/24/2018: Left hip joint injection under ultrasound (good relief) 03/12/2018: Left L2-3 transforaminal ESI (no relief) 02/01/2018: Left L5-S1 transforaminal ESI (relief x5 days) 12/05/2017: Bilateral L5-S1 transforaminal ESI (no relief) 09/13/2017: Right L3 trigger point injection (moderate relief) 02/15/2017: Bilateral L5-S1 transforaminal ESI (good relief for 2 weeks, then moderate sustained relief) 09/23/2016: Bilateral L4-5 transforaminal ESI (mild to moderate relief) 12/17/2015: Bilateral L4-5 transforaminal ESI (good relief) 05/26/2015: Left L4-5 transforaminal ESI (good relief of left leg pain) 04/22/2015: Bilateral L5-S1 transforaminal ESI (relief of right leg pain, worsening of left leg pain but then in an L4 distribution) 08/27/2014: Right L4-5 transforaminal ESI (resolution of right L4 radicular symptoms) 07/16/2014: Right L3-4 transforaminal ESI (resolution of right L3 radicular symptoms) 10/25/2013: Left L4-5 transforaminal ESI (90% relief) 09/04/2013: Left L4-5 transforaminal ESI   Past Surgery: denies  Susan [name] has no symptoms of cervical myelopathy.  The symptoms are causing a significant impact on the patient's life.   I have utilized the care everywhere function in epic to review the outside records available from external health systems.  Review of Systems:  A 10 point review of systems is negative, except for the pertinent positives and negatives detailed in the HPI.  Past Medical History: Past Medical History:  Diagnosis Date   ADD (attention deficit disorder)    Alcohol abuse    Anemia    Anxiety    Aortic atherosclerosis ([place]) 02/20/2018   Chest CT Sept  2019   Arthritis  rheumatoid arthritis   Asthma    Bipolar disorder ([place])    Constipation    Degenerative disc disease at L5-S1 level 09/28/2016   See ortho note May 2018   Depression    bipolar, hx of suicide attempt   Diabetes mellitus, type 2 ([place])    Diverticulitis 2013   DOE (dyspnea on exertion)    Drug use    GERD (gastroesophageal reflux disease)    H/O suicide attempt    slit wrists   Hepatitis C 06/26/2014   Hip pain    History of echocardiogram    a. 04/2019 Echo: EF >65%, nl RV fxn.   History of MRSA infection 2013   HLD (hyperlipidemia)    Hypertension    a. 06/2021 Renal Duplex: ? bilat RAS; b. 06/2021 CTA Abd: No signif RAS.   Hyponatremia    Hypothyroidism    Incisional hernia 11/08/2012   Knee pain    Multinodular thyroid    OSA (obstructive sleep apnea)    Osteoporosis    Palpitations    Post-traumatic osteoarthritis of right knee 09/24/2015   Recurrent ventral hernia 11/08/2012   Status post total right knee replacement using cement 05/10/2016   Vitamin B12 deficiency    Vitamin D deficiency disease     Past Surgical History: Past Surgical History:  Procedure Laterality Date   BILATERAL SALPINGOOPHORECTOMY     due to abnormal mass   BREAST BIOPSY Left    neg   BREAST SURGERY Left 20 yrs ago   CESAREAN SECTION     COLONOSCOPY     COLONOSCOPY WITH PROPOFOL N/A 10/16/2017   Procedure: COLONOSCOPY WITH PROPOFOL;  Surgeon: Jonathon Bellows, MD;  Location: [facility] ENDOSCOPY;  Service: Gastroenterology;  Laterality: N/A;   COLONOSCOPY WITH PROPOFOL N/A 02/16/2021   Procedure: COLONOSCOPY WITH PROPOFOL;  Surgeon: Jonathon Bellows, MD;  Location: [facility] ENDOSCOPY;  Service: Gastroenterology;  Laterality: N/A;   HERNIA REPAIR  06/2011, July 2014   Ventral wall repair with Physiomesh   HERNIA REPAIR     2nd.vental wall repair   JOINT REPLACEMENT Right    knee   TONSILLECTOMY     TOTAL HIP ARTHROPLASTY Left 07/23/2019   Procedure: TOTAL HIP ARTHROPLASTY;  Surgeon:  Corky Mull, MD;  Location: [facility] ORS;  Service: Orthopedics;  Laterality: Left;   TOTAL KNEE ARTHROPLASTY Right 05/10/2016   Procedure: TOTAL KNEE ARTHROPLASTY;  Surgeon: Corky Mull, MD;  Location: [facility] ORS;  Service: Orthopedics;  Laterality: Right;   TUBAL LIGATION      Allergies: Allergies as of 06/02/2022 - Review Complete 06/02/2022  Allergen Reaction Noted   Wellbutrin [bupropion]  03/18/2020   Lasix [furosemide] Other (See Comments) 11/08/2012    Medications: No outpatient medications have been marked as taking for the 06/02/22 encounter (Office Visit) with Meade Maw, MD.    Social History: Social History   Tobacco Use   Smoking status: Every Day    Packs/day: 0.50    Years: 41.00    Total pack years: 20.50    Types: Cigarettes   Smokeless tobacco: Never  Vaping Use   Vaping Use: Never used  Substance Use Topics   Alcohol use: No    Alcohol/week: 0.0 standard drinks of 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   Drug use: No    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    Family Medical History: Family History  Problem Relation Age of Onset   Arthritis Mother  Asthma Mother    Mental illness Mother    Thyroid disease Mother    COPD Mother    Heart disease Mother    Congestive Heart Failure Mother    Alcohol abuse Mother    Eating disorder Mother    Bipolar disorder Mother    Alcohol abuse Father    Heart attack Father    Alcohol abuse Sister    Drug abuse Sister    Mental illness Sister    Mental illness Sister    Fibromyalgia Sister    Obesity Sister    Pneumonia Sister    Depression Sister    Mental illness Sister    Alcohol abuse Sister    Drug abuse Sister    Arthritis Brother    Mental illness Brother    Cancer Brother        non-hodkins lymphoma   Drug abuse Daughter    Drug abuse Son    Alcohol abuse Son    Drug abuse Son    Alcohol abuse Son    Diabetes Neg Hx    Stroke Neg Hx    Breast cancer Neg Hx     Thyroid cancer Neg Hx     Physical Examination: Vitals:   06/02/22 0839  BP: (!) 170/100  Pulse: 73    General: Patient is well developed, well nourished, calm, collected, and in no apparent distress. Attention to examination is appropriate.  Neck:   Supple.  Full range of motion.  Respiratory: Patient is breathing without any difficulty.   NEUROLOGICAL:     Awake, alert, oriented to person, place, and time.  Speech is clear and fluent.   Cranial Nerves: Pupils equal round and reactive to light.  Facial tone is symmetric.  Facial sensation is symmetric. Shoulder shrug is symmetric. Tongue protrusion is midline.  There is no pronator drift.   Strength: Side Biceps Triceps Deltoid Interossei Grip Wrist Ext. Wrist Flex.  R '5 5 5 5 5 5 5  '$ L '5 5 5 5 5 5 5   '$ Side Iliopsoas Quads Hamstring PF DF EHL  R '5 5 5 5 5 5  '$ L '5 5 5 5 5 5   '$ Reflexes are 1+ and symmetric at the biceps, triceps, brachioradialis, patella and achilles.   Hoffman's is absent.   Bilateral upper and lower extremity sensation is intact to light touch.    No evidence of dysmetria noted.  Gait is antalgic.     Medical Decision Making  Imaging: MRI L spine 05/18/2022 IMPRESSION: 1. Progressive advanced bilateral facet hypertrophy at L4-5 with a new right-sided facet effusion. 2. A heterogeneous lesion arising from the right facet at L4-5 extends superiorly 18 mm and likely represents a synovial cyst contributing to moderate to severe central canal and right lateral recess narrowing. I 3. Progressive moderate bilateral foraminal stenosis at L4-5. 4. Progressive advanced bilateral facet hypertrophy at L5-S1, right greater than left. 5. Mild right foraminal stenosis at L5-S1. 6. Mild left subarticular and foraminal narrowing at L1-2 and L2-3 is stable. 7. Mild foraminal narrowing bilaterally at L3-4.     Electronically Signed   By: San Morelle M.D.   On: 05/18/2022 10:15    I have  personally reviewed the images and agree with the above interpretation.  Assessment and Plan: Susan [name] is a pleasant 62 y.o. female with acquired scoliosis over the past several years as apparent on her CT scans from 2013 and 2023.  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  This is consistent with her symptoms.  She has lumbar radiculopathy consistent with L5 distribution.  She is interested in trying to pursue further conservative management.  I will send her for physical therapy and see her back in a couple of months.  If she has not improved at that time, we will discuss synovial cyst resection.  Given the progression of her scoliosis, she may need an intervention on that at some point.  I did discuss with her that she would need to meet her weight goal 190 pounds and to be off all nicotine containing products to consider any major spinal reconstruction should that ever be indicated.    I spent a total of 40 minutes in this patient's care today. This time was spent reviewing pertinent records including imaging studies, obtaining and confirming history, performing a directed evaluation, formulating and discussing my recommendations, and documenting the visit within the medical record.     Thank you for involving me in the care of this patient.      Giang Hemme K. Izora Ribas MD, [facility]

## 2022-06-02 ENCOUNTER — Other Ambulatory Visit: Payer: Self-pay

## 2022-06-02 ENCOUNTER — Ambulatory Visit (INDEPENDENT_AMBULATORY_CARE_PROVIDER_SITE_OTHER): Payer: 59 | Admitting: Neurosurgery

## 2022-06-02 ENCOUNTER — Encounter: Payer: Self-pay | Admitting: Neurosurgery

## 2022-06-02 VITALS — BP 170/100 | HR 73 | Ht 62.0 in | Wt 219.8 lb

## 2022-06-02 DIAGNOSIS — M7138 Other bursal cyst, other site: Secondary | ICD-10-CM | POA: Diagnosis not present

## 2022-06-02 DIAGNOSIS — M5416 Radiculopathy, lumbar region: Secondary | ICD-10-CM | POA: Diagnosis not present

## 2022-06-02 MED ORDER — ALBUTEROL SULFATE HFA 108 (90 BASE) MCG/ACT IN AERS: 2.0000 | INHALATION_SPRAY | Freq: Four times a day (QID) | RESPIRATORY_TRACT | 2 refills | Status: DC | PRN

## 2022-06-06 DIAGNOSIS — Z79899 Other long term (current) drug therapy: Secondary | ICD-10-CM | POA: Diagnosis not present

## 2022-06-06 DIAGNOSIS — M5030 Other cervical disc degeneration, unspecified cervical region: Secondary | ICD-10-CM | POA: Diagnosis not present

## 2022-06-06 DIAGNOSIS — M1611 Unilateral primary osteoarthritis, right hip: Secondary | ICD-10-CM | POA: Diagnosis not present

## 2022-06-06 DIAGNOSIS — M48062 Spinal stenosis, lumbar region with neurogenic claudication: Secondary | ICD-10-CM | POA: Diagnosis not present

## 2022-06-06 DIAGNOSIS — M5136 Other intervertebral disc degeneration, lumbar region: Secondary | ICD-10-CM | POA: Diagnosis not present

## 2022-06-06 DIAGNOSIS — M5126 Other intervertebral disc displacement, lumbar region: Secondary | ICD-10-CM | POA: Diagnosis not present

## 2022-06-06 DIAGNOSIS — M5412 Radiculopathy, cervical region: Secondary | ICD-10-CM | POA: Diagnosis not present

## 2022-06-06 DIAGNOSIS — M7061 Trochanteric bursitis, right hip: Secondary | ICD-10-CM | POA: Diagnosis not present

## 2022-06-06 DIAGNOSIS — M5416 Radiculopathy, lumbar region: Secondary | ICD-10-CM | POA: Diagnosis not present

## 2022-06-17 ENCOUNTER — Ambulatory Visit: Payer: Medicare Other | Admitting: Internal Medicine

## 2022-06-18 ENCOUNTER — Other Ambulatory Visit: Payer: Self-pay | Admitting: Psychiatry

## 2022-06-18 DIAGNOSIS — F4310 Post-traumatic stress disorder, unspecified: Secondary | ICD-10-CM

## 2022-06-18 DIAGNOSIS — F3131 Bipolar disorder, current episode depressed, mild: Secondary | ICD-10-CM

## 2022-06-28 ENCOUNTER — Ambulatory Visit (INDEPENDENT_AMBULATORY_CARE_PROVIDER_SITE_OTHER): Payer: 59 | Admitting: Family Medicine

## 2022-06-28 ENCOUNTER — Encounter: Payer: Self-pay | Admitting: Family Medicine

## 2022-06-28 VITALS — BP 126/86 | HR 73 | Temp 97.9°F | Ht 62.0 in | Wt 229.2 lb

## 2022-06-28 DIAGNOSIS — I700 Atherosclerosis of aorta: Secondary | ICD-10-CM

## 2022-06-28 DIAGNOSIS — L989 Disorder of the skin and subcutaneous tissue, unspecified: Secondary | ICD-10-CM | POA: Insufficient documentation

## 2022-06-28 DIAGNOSIS — J432 Centrilobular emphysema: Secondary | ICD-10-CM | POA: Diagnosis not present

## 2022-06-28 DIAGNOSIS — I10 Essential (primary) hypertension: Secondary | ICD-10-CM | POA: Diagnosis not present

## 2022-06-28 DIAGNOSIS — E785 Hyperlipidemia, unspecified: Secondary | ICD-10-CM

## 2022-06-28 DIAGNOSIS — E538 Deficiency of other specified B group vitamins: Secondary | ICD-10-CM

## 2022-06-28 DIAGNOSIS — E119 Type 2 diabetes mellitus without complications: Secondary | ICD-10-CM

## 2022-06-28 DIAGNOSIS — F17209 Nicotine dependence, unspecified, with unspecified nicotine-induced disorders: Secondary | ICD-10-CM

## 2022-06-28 DIAGNOSIS — F419 Anxiety disorder, unspecified: Secondary | ICD-10-CM

## 2022-06-28 DIAGNOSIS — E559 Vitamin D deficiency, unspecified: Secondary | ICD-10-CM

## 2022-06-28 HISTORY — DX: Disorder of the skin and subcutaneous tissue, unspecified: L98.9

## 2022-06-28 LAB — COMPREHENSIVE METABOLIC PANEL
ALT: 10 U/L (ref 0–35)
AST: 12 U/L (ref 0–37)
Albumin: 4.1 g/dL (ref 3.5–5.2)
Alkaline Phosphatase: 96 U/L (ref 39–117)
BUN: 16 mg/dL (ref 6–23)
CO2: 26 mEq/L (ref 19–32)
Calcium: 9 mg/dL (ref 8.4–10.5)
Chloride: 102 mEq/L (ref 96–112)
Creatinine, Ser: 0.94 mg/dL (ref 0.40–1.20)
GFR: 65.53 mL/min (ref >60.00)
Glucose, Bld: 89 mg/dL (ref 70–99)
Potassium: 4 mEq/L (ref 3.5–5.1)
Sodium: 137 mEq/L (ref 135–145)
Total Bilirubin: 0.3 mg/dL (ref 0.2–1.2)
Total Protein: 6.4 g/dL (ref 6.0–8.3)

## 2022-06-28 LAB — HEMOGLOBIN A1C: Hgb A1c MFr Bld: 5.9 % (ref 4.6–6.5)

## 2022-06-28 LAB — MICROALBUMIN / CREATININE URINE RATIO
Creatinine,U: 28.3 mg/dL
Microalb Creat Ratio: 6.5 mg/g (ref 0.0–30.0)
Microalb, Ur: 1.8 mg/dL (ref 0.0–1.9)

## 2022-06-28 LAB — LIPID PANEL
Cholesterol: 180 mg/dL (ref 0–200)
HDL: 45.1 mg/dL (ref >39.00)
LDL Cholesterol: 108 mg/dL — ABNORMAL HIGH (ref 0–99)
NonHDL: 135.38
Total CHOL/HDL Ratio: 4
Triglycerides: 137 mg/dL (ref 0.0–149.0)
VLDL: 27.4 mg/dL (ref 0.0–40.0)

## 2022-06-28 LAB — VITAMIN D 25 HYDROXY (VIT D DEFICIENCY, FRACTURES): VITD: <7 ng/mL — ABNORMAL LOW (ref 30.00–100.00)

## 2022-06-28 LAB — VITAMIN B12: Vitamin B-12: 303 pg/mL (ref 211–911)

## 2022-06-28 NOTE — Assessment & Plan Note (Signed)
Chronic issue.  She will continue to follow with psychiatry.

## 2022-06-28 NOTE — Assessment & Plan Note (Signed)
Encouraged exercise.  Patient request referral to bariatric center for dietary counseling.  Referral placed.

## 2022-06-28 NOTE — Assessment & Plan Note (Signed)
Check cholesterol today 

## 2022-06-28 NOTE — Assessment & Plan Note (Signed)
Possibly actinic keratosis versus squamous cell carcinoma.  We will refer to dermatology.

## 2022-06-28 NOTE — Assessment & Plan Note (Addendum)
Reports history of diabetes.  A1c has been up to 6.3 from what I can see.  We will recheck today and consider Ozempic.  She is going to confirm whether or not her mother had thyroid cancer.  We will consider starting the patient on ozempic once she confirms mothers history.  They were counseled on the risk of pancreatitis and gallbladder disease.  Discussed the risk of nausea.  They were advised to discontinue the ozempic and contact us immediately if they develop abdominal pain.  If they develop excessive nausea they will contact us right away.  I discussed that medullary thyroid cancer has been seen in rats studies.  The patient confirmed no personal or family history of parathyroid cancer, or adrenal gland cancer.  She will confirm her mothers thyroid history. Discussed that we thus far have not seen medullary thyroid cancer result from use of this type of medication in humans.  They were advised to monitor the thyroid area and contact us for any lumps, swelling, trouble swallowing, or any other changes in this area.  Discussed goal weight loss of 1 to 2 pounds a week while on this medication.

## 2022-06-28 NOTE — Assessment & Plan Note (Signed)
Counseled on smoking cessation.  She will quit smoking this weekend.

## 2022-06-28 NOTE — Assessment & Plan Note (Addendum)
Chronic issue.  Checking labs today to evaluate for risk factor management options.

## 2022-06-28 NOTE — Assessment & Plan Note (Signed)
Chronic issue.  Possibly contributing to dyspnea on exertion.  She will quit smoking this weekend and work on trying to start exercising.

## 2022-06-28 NOTE — Assessment & Plan Note (Signed)
Chronic issue.  Above goal.  She will continue carvedilol 25 mg twice daily and telmisartan 80 mg daily.  Will check labs.  Consider starting spironolactone once her labs return.

## 2022-06-28 NOTE — Progress Notes (Signed)
Tommi Rumps, MD Phone: [phone]  Susan [name] is a 62 y.o. female who presents today for f/u.  HYPERTENSION Disease Monitoring Home BP Monitoring 120-130/80-85 Chest pain- no    Dyspnea- chronic related to obesity and smoking Medications Compliance-  taking coreg, telmisartan.   Previously on amlodipine (leg swelling) and HCTZ (electrolyte abnormalities) BMET    Component Value Date/Time   NA 126 (L) 07/23/2021 1024   NA 137 05/28/2020 0903   NA 136 04/29/2014 1133   K 4.0 07/23/2021 1024   K 4.2 04/29/2014 1133   CL 94 (L) 07/23/2021 1024   CL 103 04/29/2014 1133   CO2 24 07/23/2021 1024   CO2 30 04/29/2014 1133   GLUCOSE 102 (H) 07/23/2021 1024   GLUCOSE 85 04/29/2014 1133   BUN 18 07/23/2021 1024   BUN 9 05/28/2020 0903   BUN 9 04/29/2014 1133   CREATININE 1.32 (H) 07/23/2021 1024   CREATININE 0.92 10/16/2019 1139   CALCIUM 9.4 07/23/2021 1024   CALCIUM 9.1 04/29/2014 1133   GFRNONAA >60 04/12/2021 1655   GFRNONAA 69 10/16/2019 1139   GFRAA 64 05/28/2020 0903   GFRAA 80 10/16/2019 1139   Diabetes: Patient reports history of diabetes with an A1c around 7 a couple years ago.  The highest A1c I can find in the chart is 6.3.  She notes polyuria and polydipsia.  She is not on medications.  She was on Ozempic previously and notes she lost about 40 pounds with this.  Notes she ran out and did not pursue refills.  Patient notes no personal or family history of parathyroid cancer or adrenal gland cancer.  She notes her mother's thyroid was removed about 40 years ago and she thinks this was not related to cancer.  She notes no history of pancreatitis or gallbladder disease.  Anxiety: Patient notes a tingly sensation in her chest when she gets anxiety.  She follows with psychiatry.  Dyspnea on exertion: Patient notes this occurs when she walks.  She attributes this to smoking and obesity.  She plans to quit smoking cold [place] this weekend.  She also has patches in  place.  She notes she has to quit using nicotine products to be able to have back surgery in the future.  Skin lesion: Patient notes between her eyes there is been a dry spot that is been there for several years though is getting bigger recently.  She wonders about seeing dermatology.  Social History   Tobacco Use  Smoking Status Every Day   Packs/day: 0.50   Years: 41.00   Total pack years: 20.50   Types: Cigarettes  Smokeless Tobacco Never    Current Outpatient Medications on File Prior to Visit  Medication Sig Dispense Refill   acetaminophen (TYLENOL) 325 MG tablet Take 650 mg by mouth every 6 (six) hours as needed.     albuterol (VENTOLIN HFA) 108 (90 Base) MCG/ACT inhaler Inhale 2 puffs into the lungs every 6 (six) hours as needed for wheezing or shortness of breath. 8 g 2   carvedilol (COREG) 25 MG tablet TAKE 1 TABLET BY MOUTH TWICE DAILY WITH MEAL. MONITOR HEART RATE 60 tablet 3   clotrimazole (LOTRIMIN) 1 % cream Apply 1 application topically 2 (two) times daily as needed.     cyclobenzaprine (FLEXERIL) 5 MG tablet Take 5 mg by mouth 2 (two) times daily as needed for muscle spasms.      DULoxetine (CYMBALTA) 20 MG capsule TAKE 1 CAPSULE BY MOUTH EVERY DAY  WITH 30MG CAPSULE 30 capsule 1   DULoxetine (CYMBALTA) 30 MG capsule Take 1 capsule (30 mg total) by mouth daily. 30 capsule 1   gabapentin (NEURONTIN) 300 MG capsule Take 600 mg by mouth 2 (two) times daily.      glucose blood (ACCU-CHEK GUIDE) test strip TEST UP TO FOUR TIMES A DAY 100 each 0   HYDROcodone-acetaminophen (NORCO/VICODIN) 5-325 MG tablet Take 1 tablet by mouth 3 (three) times daily as needed for moderate pain.     meloxicam (MOBIC) 15 MG tablet Take 15 mg by mouth daily.     naloxone (NARCAN) nasal spray 4 mg/0.1 mL Place into the nose.     telmisartan (MICARDIS) 80 MG tablet TAKE 1 TABLET(80 MG) BY MOUTH DAILY 90 tablet 1   No current facility-administered medications on file prior to visit.     ROS see  history of present illness  Objective  Physical Exam Vitals:   06/28/22 0811  BP: 126/86  Pulse: 73  Temp: 97.9 F (36.6 C)  SpO2: 99%    BP Readings from Last 3 Encounters:  06/28/22 126/86  06/02/22 (!) 170/100  03/03/22 134/80   Wt Readings from Last 3 Encounters:  06/28/22 229 lb 3.2 oz (104 kg)  06/02/22 219 lb 12.8 oz (99.7 kg)  03/03/22 214 lb 6.4 oz (97.3 kg)    Physical Exam Constitutional:      General: She is not in acute distress.    Appearance: She is not diaphoretic.  HENT:     Head:   Cardiovascular:     Rate and Rhythm: Normal rate and regular rhythm.     Heart sounds: Normal heart sounds.  Pulmonary:     Effort: Pulmonary effort is normal.     Breath sounds: Normal breath sounds.  Skin:    General: Skin is warm and dry.  Neurological:     Mental Status: She is alert.    Diabetic Foot Exam - Simple   Simple Foot Form Diabetic Foot exam was performed with the following findings: Yes 06/28/2022  8:30 AM  Visual Inspection No deformities, no ulcerations, no other skin breakdown bilaterally: Yes Sensation Testing Intact to touch and monofilament testing bilaterally: Yes Pulse Check Posterior Tibialis and Dorsalis pulse intact bilaterally: Yes Comments       Assessment/Plan: Please see individual problem list.  Type 2 diabetes mellitus without complication, without long-term current use of insulin (HCC) Assessment & Plan: Reports history of diabetes.  A1c has been up to 6.3 from what I can see.  We will recheck today and consider Ozempic.  She is going to confirm whether or not her mother had thyroid cancer.  We will consider starting the patient on ozempic once she confirms mothers history.  They were counseled on the risk of pancreatitis and gallbladder disease.  Discussed the risk of nausea.  They were advised to discontinue the ozempic and contact us immediately if they develop abdominal pain.  If they develop excessive nausea they will  contact us right away.  I discussed that medullary thyroid cancer has been seen in rats studies.  The patient confirmed no personal or family history of parathyroid cancer, or adrenal gland cancer.  She will confirm her mothers thyroid history. Discussed that we thus far have not seen medullary thyroid cancer result from use of this type of medication in humans.  They were advised to monitor the thyroid area and contact us for any lumps, swelling, trouble swallowing, or any other changes in  this area.  Discussed goal weight loss of 1 to 2 pounds a week while on this medication.   Orders: -     Comprehensive metabolic panel -     Hemoglobin A1c -     Lipid panel -     Microalbumin / creatinine urine ratio -     Ambulatory referral to Ophthalmology  Primary hypertension Assessment & Plan: Chronic issue.  Above goal.  She will continue carvedilol 25 mg twice daily and telmisartan 80 mg daily.  Will check labs.  Consider starting spironolactone once her labs return.  Orders: -     Comprehensive metabolic panel  Aortic atherosclerosis ([place]) Assessment & Plan: Chronic issue.  Checking labs today to evaluate for risk factor management options.   Centrilobular emphysema ([place]) Assessment & Plan: Chronic issue.  Possibly contributing to dyspnea on exertion.  She will quit smoking this weekend and work on trying to start exercising.   Tobacco use disorder, continuous Assessment & Plan: Counseled on smoking cessation.  She will quit smoking this weekend.   Vitamin D deficiency -     VITAMIN D 25 Hydroxy (Vit-D Deficiency, Fractures)  Vitamin B12 deficiency -     Vitamin B12  Hyperlipidemia, unspecified hyperlipidemia type Assessment & Plan: Check cholesterol today.  Orders: -     Comprehensive metabolic panel -     Lipid panel  Morbid obesity ([place]) Assessment & Plan: Encouraged exercise.  Patient request referral to bariatric center for dietary counseling.  Referral  placed.  Orders: -     Amb Ref to Medical Weight Management  Skin lesion Assessment & Plan: Possibly actinic keratosis versus squamous cell carcinoma.  We will refer to dermatology.  Orders: -     Ambulatory referral to Dermatology  Anxiety Assessment & Plan: Chronic issue.  She will continue to follow with psychiatry.      Return in about 3 months (around 09/26/2022) for Hypertension, diabetes.   I have spent 42 minutes in the care of this patient regarding history taking, documentation, completion of exam, discussion of plan, placing orders.   Tommi Rumps, MD [place]

## 2022-06-28 NOTE — Patient Instructions (Signed)
Nice to see you. Will get lab work today and then determine if we can start on [place] and determine what to do for your blood pressure. Please confirm your mother's thyroid history. You should hear from dermatology and the weight loss clinic.  If you do not hear from them in the next 2 weeks please let us know.

## 2022-06-29 ENCOUNTER — Telehealth: Payer: Self-pay | Admitting: Family Medicine

## 2022-06-29 NOTE — Telephone Encounter (Signed)
Patient called and wanted Dr Caryl Bis to know that patients mother did not have thyroid cancer.

## 2022-06-30 NOTE — Telephone Encounter (Signed)
Pt returned call. Transferred to [employer].

## 2022-06-30 NOTE — Telephone Encounter (Signed)
Noted  

## 2022-07-04 ENCOUNTER — Telehealth: Payer: Self-pay | Admitting: Family Medicine

## 2022-07-04 ENCOUNTER — Other Ambulatory Visit: Payer: Self-pay

## 2022-07-04 ENCOUNTER — Other Ambulatory Visit: Payer: Self-pay | Admitting: Family

## 2022-07-04 DIAGNOSIS — E559 Vitamin D deficiency, unspecified: Secondary | ICD-10-CM

## 2022-07-04 MED ORDER — VITAMIN D (ERGOCALCIFEROL) 1.25 MG (50000 UNIT) PO CAPS: 50000.0000 [IU] | ORAL_CAPSULE | ORAL | 0 refills | Status: DC

## 2022-07-04 MED ORDER — SEMAGLUTIDE(0.25 OR 0.5MG/DOS) 2 MG/3ML ~~LOC~~ SOPN: 0.2500 mg | PEN_INJECTOR | SUBCUTANEOUS | 1 refills | Status: DC

## 2022-07-04 MED ORDER — ROSUVASTATIN CALCIUM 20 MG PO TABS: 20.0000 mg | ORAL_TABLET | Freq: Every day | ORAL | 3 refills | Status: DC

## 2022-07-04 NOTE — Telephone Encounter (Signed)
Lm to advise pt rx's sent  Needs lab appt 6wks

## 2022-07-04 NOTE — Telephone Encounter (Signed)
Pt called stating she receive her lab results and thought the provider was going to send in medication but nothing is there at the pharmacy

## 2022-07-04 NOTE — Telephone Encounter (Signed)
Patient called, note was read and a 6w lab appointment scheduled.

## 2022-07-05 ENCOUNTER — Emergency Department: Payer: 59

## 2022-07-05 ENCOUNTER — Other Ambulatory Visit: Payer: Self-pay

## 2022-07-05 ENCOUNTER — Inpatient Hospital Stay
Admission: EM | Admit: 2022-07-05 | Discharge: 2022-07-07 | DRG: 062 | Disposition: A | Discharge status: Home Health | Payer: 59 | Attending: Internal Medicine | Admitting: Internal Medicine

## 2022-07-05 ENCOUNTER — Telehealth: Payer: Self-pay | Admitting: Family Medicine

## 2022-07-05 DIAGNOSIS — E039 Hypothyroidism, unspecified: Secondary | ICD-10-CM | POA: Diagnosis not present

## 2022-07-05 DIAGNOSIS — E785 Hyperlipidemia, unspecified: Secondary | ICD-10-CM | POA: Diagnosis not present

## 2022-07-05 DIAGNOSIS — Z9151 Personal history of suicidal behavior: Secondary | ICD-10-CM

## 2022-07-05 DIAGNOSIS — G4733 Obstructive sleep apnea (adult) (pediatric): Secondary | ICD-10-CM | POA: Diagnosis not present

## 2022-07-05 DIAGNOSIS — E049 Nontoxic goiter, unspecified: Secondary | ICD-10-CM | POA: Diagnosis present

## 2022-07-05 DIAGNOSIS — R270 Ataxia, unspecified: Secondary | ICD-10-CM | POA: Diagnosis not present

## 2022-07-05 DIAGNOSIS — M1731 Unilateral post-traumatic osteoarthritis, right knee: Secondary | ICD-10-CM | POA: Diagnosis not present

## 2022-07-05 DIAGNOSIS — R29706 NIHSS score 6: Secondary | ICD-10-CM | POA: Diagnosis present

## 2022-07-05 DIAGNOSIS — Z8261 Family history of arthritis: Secondary | ICD-10-CM

## 2022-07-05 DIAGNOSIS — R6889 Other general symptoms and signs: Secondary | ICD-10-CM | POA: Diagnosis not present

## 2022-07-05 DIAGNOSIS — M810 Age-related osteoporosis without current pathological fracture: Secondary | ICD-10-CM | POA: Diagnosis not present

## 2022-07-05 DIAGNOSIS — I161 Hypertensive emergency: Secondary | ICD-10-CM | POA: Diagnosis not present

## 2022-07-05 DIAGNOSIS — R29810 Facial weakness: Secondary | ICD-10-CM | POA: Diagnosis not present

## 2022-07-05 DIAGNOSIS — R200 Anesthesia of skin: Secondary | ICD-10-CM | POA: Diagnosis not present

## 2022-07-05 DIAGNOSIS — I63512 Cerebral infarction due to unspecified occlusion or stenosis of left middle cerebral artery: Secondary | ICD-10-CM | POA: Diagnosis not present

## 2022-07-05 DIAGNOSIS — E1165 Type 2 diabetes mellitus with hyperglycemia: Secondary | ICD-10-CM | POA: Diagnosis present

## 2022-07-05 DIAGNOSIS — Z8614 Personal history of Methicillin resistant Staphylococcus aureus infection: Secondary | ICD-10-CM

## 2022-07-05 DIAGNOSIS — M069 Rheumatoid arthritis, unspecified: Secondary | ICD-10-CM | POA: Diagnosis present

## 2022-07-05 DIAGNOSIS — Z79899 Other long term (current) drug therapy: Secondary | ICD-10-CM

## 2022-07-05 DIAGNOSIS — Z791 Long term (current) use of non-steroidal anti-inflammatories (NSAID): Secondary | ICD-10-CM

## 2022-07-05 DIAGNOSIS — Z8249 Family history of ischemic heart disease and other diseases of the circulatory system: Secondary | ICD-10-CM | POA: Diagnosis not present

## 2022-07-05 DIAGNOSIS — J4489 Other specified chronic obstructive pulmonary disease: Secondary | ICD-10-CM | POA: Diagnosis present

## 2022-07-05 DIAGNOSIS — Z96651 Presence of right artificial knee joint: Secondary | ICD-10-CM | POA: Diagnosis not present

## 2022-07-05 DIAGNOSIS — E559 Vitamin D deficiency, unspecified: Secondary | ICD-10-CM | POA: Diagnosis not present

## 2022-07-05 DIAGNOSIS — Z9989 Dependence on other enabling machines and devices: Secondary | ICD-10-CM

## 2022-07-05 DIAGNOSIS — I700 Atherosclerosis of aorta: Secondary | ICD-10-CM | POA: Diagnosis present

## 2022-07-05 DIAGNOSIS — F319 Bipolar disorder, unspecified: Secondary | ICD-10-CM | POA: Diagnosis present

## 2022-07-05 DIAGNOSIS — Z813 Family history of other psychoactive substance abuse and dependence: Secondary | ICD-10-CM

## 2022-07-05 DIAGNOSIS — I639 Cerebral infarction, unspecified: Secondary | ICD-10-CM | POA: Diagnosis present

## 2022-07-05 DIAGNOSIS — I6389 Other cerebral infarction: Secondary | ICD-10-CM | POA: Diagnosis not present

## 2022-07-05 DIAGNOSIS — Z743 Need for continuous supervision: Secondary | ICD-10-CM | POA: Diagnosis not present

## 2022-07-05 DIAGNOSIS — Z825 Family history of asthma and other chronic lower respiratory diseases: Secondary | ICD-10-CM

## 2022-07-05 DIAGNOSIS — Z96653 Presence of artificial knee joint, bilateral: Secondary | ICD-10-CM | POA: Diagnosis present

## 2022-07-05 DIAGNOSIS — G8191 Hemiplegia, unspecified affecting right dominant side: Secondary | ICD-10-CM | POA: Diagnosis not present

## 2022-07-05 DIAGNOSIS — I10 Essential (primary) hypertension: Secondary | ICD-10-CM | POA: Diagnosis not present

## 2022-07-05 DIAGNOSIS — Z79891 Long term (current) use of opiate analgesic: Secondary | ICD-10-CM

## 2022-07-05 DIAGNOSIS — Z888 Allergy status to other drugs, medicaments and biological substances status: Secondary | ICD-10-CM

## 2022-07-05 DIAGNOSIS — R202 Paresthesia of skin: Secondary | ICD-10-CM | POA: Diagnosis not present

## 2022-07-05 DIAGNOSIS — Z8349 Family history of other endocrine, nutritional and metabolic diseases: Secondary | ICD-10-CM

## 2022-07-05 DIAGNOSIS — I6350 Cerebral infarction due to unspecified occlusion or stenosis of unspecified cerebral artery: Secondary | ICD-10-CM | POA: Diagnosis not present

## 2022-07-05 DIAGNOSIS — Z807 Family history of other malignant neoplasms of lymphoid, hematopoietic and related tissues: Secondary | ICD-10-CM

## 2022-07-05 DIAGNOSIS — K219 Gastro-esophageal reflux disease without esophagitis: Secondary | ICD-10-CM | POA: Diagnosis present

## 2022-07-05 DIAGNOSIS — R29818 Other symptoms and signs involving the nervous system: Secondary | ICD-10-CM | POA: Diagnosis not present

## 2022-07-05 DIAGNOSIS — F17210 Nicotine dependence, cigarettes, uncomplicated: Secondary | ICD-10-CM | POA: Diagnosis not present

## 2022-07-05 DIAGNOSIS — Z811 Family history of alcohol abuse and dependence: Secondary | ICD-10-CM

## 2022-07-05 DIAGNOSIS — R471 Dysarthria and anarthria: Secondary | ICD-10-CM | POA: Diagnosis present

## 2022-07-05 DIAGNOSIS — I6521 Occlusion and stenosis of right carotid artery: Secondary | ICD-10-CM | POA: Diagnosis not present

## 2022-07-05 DIAGNOSIS — Z818 Family history of other mental and behavioral disorders: Secondary | ICD-10-CM

## 2022-07-05 DIAGNOSIS — R001 Bradycardia, unspecified: Secondary | ICD-10-CM | POA: Diagnosis not present

## 2022-07-05 DIAGNOSIS — Z7985 Long-term (current) use of injectable non-insulin antidiabetic drugs: Secondary | ICD-10-CM

## 2022-07-05 DIAGNOSIS — R0902 Hypoxemia: Secondary | ICD-10-CM | POA: Diagnosis not present

## 2022-07-05 HISTORY — DX: Cerebral infarction, unspecified: I63.9

## 2022-07-05 LAB — GLUCOSE, CAPILLARY: Glucose-Capillary: 97 mg/dL (ref 70–99)

## 2022-07-05 LAB — DIFFERENTIAL
Abs Immature Granulocytes: 0.03 10*3/uL (ref 0.00–0.07)
Basophils Absolute: 0.1 10*3/uL (ref 0.0–0.1)
Basophils Relative: 1 %
Eosinophils Absolute: 0.4 10*3/uL (ref 0.0–0.5)
Eosinophils Relative: 4 %
Immature Granulocytes: 0 %
Lymphocytes Relative: 35 %
Lymphs Abs: 3.1 10*3/uL (ref 0.7–4.0)
Monocytes Absolute: 0.7 10*3/uL (ref 0.1–1.0)
Monocytes Relative: 7 %
Neutro Abs: 4.7 10*3/uL (ref 1.7–7.7)
Neutrophils Relative %: 53 %

## 2022-07-05 LAB — CBG MONITORING, ED: Glucose-Capillary: 85 mg/dL (ref 70–99)

## 2022-07-05 LAB — CBC
HCT: 41.9 % (ref 36.0–46.0)
Hemoglobin: 13.7 g/dL (ref 12.0–15.0)
MCH: 31.2 pg (ref 26.0–34.0)
MCHC: 32.7 g/dL (ref 30.0–36.0)
MCV: 95.4 fL (ref 80.0–100.0)
Platelets: 214 10*3/uL (ref 150–400)
RBC: 4.39 MIL/uL (ref 3.87–5.11)
RDW: 11.9 % (ref 11.5–15.5)
WBC: 8.9 10*3/uL (ref 4.0–10.5)
nRBC: 0 % (ref 0.0–0.2)

## 2022-07-05 LAB — COMPREHENSIVE METABOLIC PANEL
ALT: 12 U/L (ref 0–44)
AST: 17 U/L (ref 15–41)
Albumin: 3.8 g/dL (ref 3.5–5.0)
Alkaline Phosphatase: 90 U/L (ref 38–126)
Anion gap: 12 (ref 5–15)
BUN: 20 mg/dL (ref 8–23)
CO2: 24 mmol/L (ref 22–32)
Calcium: 9.1 mg/dL (ref 8.9–10.3)
Chloride: 99 mmol/L (ref 98–111)
Creatinine, Ser: 1.06 mg/dL — ABNORMAL HIGH (ref 0.44–1.00)
GFR, Estimated: 60 mL/min — ABNORMAL LOW (ref >60)
Glucose, Bld: 82 mg/dL (ref 70–99)
Potassium: 4.4 mmol/L (ref 3.5–5.1)
Sodium: 135 mmol/L (ref 135–145)
Total Bilirubin: 0.7 mg/dL (ref 0.3–1.2)
Total Protein: 6.8 g/dL (ref 6.5–8.1)

## 2022-07-05 LAB — ETHANOL: Alcohol, Ethyl (B): <10 mg/dL (ref <10)

## 2022-07-05 LAB — PROTIME-INR
INR: 1.1 (ref 0.8–1.2)
Prothrombin Time: 13.6 seconds (ref 11.4–15.2)

## 2022-07-05 LAB — MRSA NEXT GEN BY PCR, NASAL: MRSA by PCR Next Gen: NOT DETECTED

## 2022-07-05 LAB — APTT: aPTT: 30 seconds (ref 24–36)

## 2022-07-05 MED ORDER — IOHEXOL 350 MG/ML SOLN
75.0000 mL | Freq: Once | INTRAVENOUS | Status: AC | PRN
  Administered 2022-07-05: 75 mL via INTRAVENOUS

## 2022-07-05 MED ORDER — TENECTEPLASE FOR STROKE
25.0000 mg | PACK | Freq: Once | INTRAVENOUS | Status: AC
  Administered 2022-07-05: 25 mg via INTRAVENOUS

## 2022-07-05 MED ORDER — CHLORHEXIDINE GLUCONATE CLOTH 2 % EX PADS
6.0000 | MEDICATED_PAD | Freq: Every day | CUTANEOUS | Status: DC
  Administered 2022-07-05 – 2022-07-06 (×2): 6 via TOPICAL

## 2022-07-05 MED ORDER — ACETAMINOPHEN 160 MG/5ML PO SOLN: 650.0000 mg | ORAL | Status: DC | PRN

## 2022-07-05 MED ORDER — SENNOSIDES-DOCUSATE SODIUM 8.6-50 MG PO TABS: 1.0000 | ORAL_TABLET | Freq: Every evening | ORAL | Status: DC | PRN

## 2022-07-05 MED ORDER — PANTOPRAZOLE SODIUM 40 MG IV SOLR
40.0000 mg | Freq: Every day | INTRAVENOUS | Status: DC
  Administered 2022-07-05 – 2022-07-06 (×2): 40 mg via INTRAVENOUS
  Filled 2022-07-05 (×2): qty 10

## 2022-07-05 MED ORDER — CLEVIDIPINE BUTYRATE 0.5 MG/ML IV EMUL
0.0000 mg/h | INTRAVENOUS | Status: DC
  Administered 2022-07-05: 4 mg/h via INTRAVENOUS
  Administered 2022-07-05 – 2022-07-06 (×3): 2 mg/h via INTRAVENOUS
  Filled 2022-07-05 (×6): qty 50

## 2022-07-05 MED ORDER — SODIUM CHLORIDE 0.9% FLUSH
3.0000 mL | Freq: Once | INTRAVENOUS | Status: AC
  Administered 2022-07-05: 3 mL via INTRAVENOUS

## 2022-07-05 MED ORDER — ACETAMINOPHEN 650 MG RE SUPP: 650.0000 mg | RECTAL | Status: DC | PRN

## 2022-07-05 MED ORDER — ACETAMINOPHEN 325 MG PO TABS: 650.0000 mg | ORAL_TABLET | ORAL | Status: DC | PRN

## 2022-07-05 MED ORDER — STROKE: EARLY STAGES OF RECOVERY BOOK: Freq: Once | Status: AC

## 2022-07-05 MED ORDER — TENECTEPLASE FOR STROKE: 25.0000 mg | PACK | Freq: Once | INTRAVENOUS | Status: DC

## 2022-07-05 NOTE — Progress Notes (Signed)
  Chaplain On-Call responded to Code Stroke notification at 1202 hours.  The patient was taken to the CT Scan area for tests. No family is present.  Chaplain assured Staff of availability if needed.  Chaplain Pollyann Samples M.Div., [facility]

## 2022-07-05 NOTE — Telephone Encounter (Signed)
Caller name:   Relationship to patient: Can be reached: Pharmacy: Last visit:   Situation: (What circumstance or reason brought patient into the office?)   Background   Symptoms:bp high 189/109, right side of body its twiling.    '@LASTVISITSECTIONDTEXT'$ @    Attributing factors (medication changes, positional changes, etc. )      Duration : just started   Pain Scale?  On 1-10 how woiuld you rate your pain? What makes it better or worse?    Blood pressure            Pulse             Temp           Dizziness : Orthostatic BP: Lying           Sitting         Standing      Urinary Symptoms POCT UA. Normal           Abnormal  Palpitations : Chest pain:  Increased Pulse : Get ECG . ( Make PCP aware first patient having chest pain).

## 2022-07-05 NOTE — Telephone Encounter (Signed)
Called Patient twice back to back and left message for her to call the office. I also called the daughter to see if she had heard from her Mother. She states her Mother usually goes to a class at 11:00 everyday but she would try to get in touch with her to see what is going on.

## 2022-07-05 NOTE — Consult Note (Signed)
CODE STROKE- PHARMACY COMMUNICATION   Time CODE STROKE called/page received: 1202  Time response to CODE STROKE was made (in person or via phone): 1220  Time Stroke Kit retrieved from [facility] (only if needed): 1218  Name of Provider/Nurse contacted: Dr. Leonel Ramsay  Past Medical History:  Diagnosis Date   ADD (attention deficit disorder)    Alcohol abuse    Anemia    Anxiety    Aortic atherosclerosis ([place]) 02/20/2018   Chest CT Sept 2019   Arthritis    rheumatoid arthritis   Asthma    Bipolar disorder ([place])    Constipation    Degenerative disc disease at L5-S1 level 09/28/2016   See ortho note May 2018   Depression    bipolar, hx of suicide attempt   Diabetes mellitus, type 2 ([place])    Diverticulitis 2013   DOE (dyspnea on exertion)    Drug use    GERD (gastroesophageal reflux disease)    H/O suicide attempt    slit wrists   Hepatitis C 06/26/2014   Hip pain    History of echocardiogram    a. 04/2019 Echo: EF >65%, nl RV fxn.   History of MRSA infection 2013   HLD (hyperlipidemia)    Hypertension    a. 06/2021 Renal Duplex: ? bilat RAS; b. 06/2021 CTA Abd: No signif RAS.   Hyponatremia    Hypothyroidism    Incisional hernia 11/08/2012   Knee pain    Multinodular thyroid    OSA (obstructive sleep apnea)    Osteoporosis    Palpitations    Post-traumatic osteoarthritis of right knee 09/24/2015   Recurrent ventral hernia 11/08/2012   Status post total right knee replacement using cement 05/10/2016   Vitamin B12 deficiency    Vitamin D deficiency disease    Prior to Admission medications   Medication Sig Start Date End Date Taking? Authorizing Provider  rosuvastatin (CRESTOR) 20 MG tablet Take 1 tablet (20 mg total) by mouth daily. 07/04/22   Kennyth Arnold, FNP  Semaglutide,0.25 or 0.'5MG'$ /DOS, 2 MG/3ML SOPN Inject 0.25 mg into the skin once a week. 07/04/22   Kennyth Arnold, FNP  Vitamin D, Ergocalciferol, (DRISDOL) 1.25 MG (50000 UNIT) CAPS capsule Take 1 capsule  (50,000 Units total) by mouth every 7 (seven) days. 07/04/22   Kennyth Arnold, FNP  acetaminophen (TYLENOL) 325 MG tablet Take 650 mg by mouth every 6 (six) hours as needed.    [provider]  albuterol (VENTOLIN HFA) 108 (90 Base) MCG/ACT inhaler Inhale 2 puffs into the lungs every 6 (six) hours as needed for wheezing or shortness of breath. 06/02/22   Chesley Mires, MD  carvedilol (COREG) 25 MG tablet TAKE 1 TABLET BY MOUTH TWICE DAILY WITH MEAL. MONITOR HEART RATE 05/20/22   Leone Haven, MD  clotrimazole (LOTRIMIN) 1 % cream Apply 1 application topically 2 (two) times daily as needed.    [provider]  cyclobenzaprine (FLEXERIL) 5 MG tablet Take 5 mg by mouth 2 (two) times daily as needed for muscle spasms.  06/04/15   [provider]  DULoxetine (CYMBALTA) 20 MG capsule TAKE 1 CAPSULE BY MOUTH EVERY DAY WITH '30MG'$  CAPSULE 06/20/22   Ursula Alert, MD  DULoxetine (CYMBALTA) 30 MG capsule Take 1 capsule (30 mg total) by mouth daily. 03/24/22   Ursula Alert, MD  gabapentin (NEURONTIN) 300 MG capsule Take 600 mg by mouth 2 (two) times daily.     [provider]  glucose blood (ACCU-CHEK GUIDE)  test strip TEST UP TO FOUR TIMES A DAY 06/03/21   Flinchum, Kelby Aline, FNP  HYDROcodone-acetaminophen (NORCO/VICODIN) 5-325 MG tablet Take 1 tablet by mouth 3 (three) times daily as needed for moderate pain.    [provider]  meloxicam (MOBIC) 15 MG tablet Take 15 mg by mouth daily. 09/16/19   [provider]  naloxone [facility]) nasal spray 4 mg/0.1 mL Place into the nose. 12/27/21   [provider]  telmisartan (MICARDIS) 80 MG tablet TAKE 1 TABLET(80 MG) BY MOUTH DAILY 05/05/22   Leone Haven, MD    Will M. Ouida Sills, PharmD PGY-1 Pharmacy Resident 07/05/2022 12:29 PM

## 2022-07-05 NOTE — H&P (Addendum)
NAME:  Susan [name], MRN:  [id number], DOB:  07-02-1960, LOS: 0 ADMISSION DATE:  07/05/2022  CHIEF COMPLAINT:  acute RT sided weakness  BRIEF SYNOPSIS Acute CVA s/p TNKASE  History of Present Illness:  62 y.o. female arriving to [facility] via [facility] on 07/05/2022 with past medical hx of DM, HTN, hypothyroidism, GERD, OSA, HLD.     Code stroke was activated by ED as patient presented with RT sided Weakness    Patient from home where she was LKW at 1000 and now complaining of right sided facial, leg, and arm numbness and tingling of right arm and incoordination.   Patient reports that this morning she had acute onset right sided tingling and numbness that started in the right hand, went up the arm, to the right side of her face, and right leg.   She also endorses right arm tingling. She then called 911.   Stroke team met patient shortly after arriving to ED.   NIHSS 6, see documentation for details and code stroke times. Patient with right facial droop, right leg weakness, right limb ataxia, right decreased sensation, and dysarthria  on exam. The following imaging was completed:  CT Head and CTA NEG Patient is a candidate for IV Thrombolytic. TNK given in CT at 1221.   Patient with SBP >180, placed on cleviprex gtt per MD order.   Care Plan: post-thrombolytic vital signs and NIHSS (q15 min x2 hours, q30 min x 6 hours, q1 h x 16 h), stroke swallow screen per protocol.        Significant Hospital Events: Including procedures, antibiotic start and stop dates in addition to other pertinent events   2/27 admitted to ICU s/p TNKASE on cleviprex     Objective   Blood pressure (!) 160/67, pulse 64, temperature 97.7 F (36.5 C), temperature source Oral, resp. rate 18, height '5\' 2"'$  (1.575 m), weight 107.2 kg, SpO2 91 %.        Intake/Output Summary (Last 24 hours) at 07/05/2022 1411 Last data filed at 07/05/2022 1251 Gross per 24 hour  Intake 2.29 ml  Output --   Net 2.29 ml   Filed Weights   07/05/22 1218 07/05/22 1302  Weight: 103.9 kg 107.2 kg        Review of Systems: Gen:  Denies  fever, sweats, chills weight loss  HEENT: Denies blurred vision, double vision, ear pain, eye pain, hearing loss, nose bleeds, sore throat Cardiac:  No dizziness, chest pain or heaviness, chest tightness,edema, No JVD Resp:   No cough, -sputum production, -shortness of breath,-wheezing, -hemoptysis,  Other:  All other systems negative   Physical Examination:   General Appearance: No distress  EYES PERRLA, EOM intact.   NECK Supple, No JVD Pulmonary: normal breath sounds, No wheezing.  CardiovascularNormal S1,S2.  No m/r/g.   Abdomen: Benign, Soft, non-tender. Neurology UE/LE 5/5 strength, no focal deficits CN intact, no drift Ext pulses intact, cap refill intact ALL OTHER ROS ARE NEGATIVE    Labs/imaging that I havepersonally reviewed  (right click and "Reselect all SmartList Selections" daily)     ASSESSMENT AND PLAN  62 yo white female with acute CVA s/p TNKASE '@1221'$  PM 07/05/2022   HTN EMERGENCY WEAN OFF CLEVEDIPINE AS TOLERATED    Check HgbA1c, fasting lipid panel - MRI of the brain without contrast - Frequent neuro checks per order set used -Maintain BP less than 180/105(Cleviprex has been ordered)-wean off as tolerated  - Echocardiogram - Telemetry monitoring - PT  consult, OT consult, Speech consult -I have advised that the patient stop smoking Avoid secondary brain injury Follow up Neuro recs  CARDIAC ICU monitoring   RENAL -continue Foley Catheter-assess need -Avoid nephrotoxic agents -Follow urine output, BMP -Ensure adequate renal perfusion, optimize oxygenation -Renal dose medications  INFECTIOUS DISEASE -continue antibiotics as prescribed -follow up cultures  ENDO - ICU hypoglycemic\Hyperglycemia protocol -check FSBS per protocol   GI GI PROPHYLAXIS as indicated  NUTRITIONAL STATUS DIET--> as  tolerated Constipation protocol as indicated   ELECTROLYTES -follow labs as needed -replace as needed -pharmacy consultation and following   ACUTE ANEMIA- TRANSFUSE AS NEEDED    Best practice (right click and "Reselect all SmartList Selections" daily)  Diet:as tolerated GI prophylaxis: PPI Mobility:  bed rest  Code Status:  FULL Disposition:ICU  Labs   CBC: Recent Labs  Lab 07/05/22 1210  WBC 8.9  NEUTROABS 4.7  HGB 13.7  HCT 41.9  MCV 95.4  PLT [id number]    Basic Metabolic Panel: Recent Labs  Lab 07/05/22 1210  NA 135  K 4.4  CL 99  CO2 24  GLUCOSE 82  BUN 20  CREATININE 1.06*  CALCIUM 9.1   GFR: Estimated Creatinine Clearance: 64.1 mL/min (A) (by C-G formula based on SCr of 1.06 mg/dL (H)). Recent Labs  Lab 07/05/22 1210  WBC 8.9    Liver Function Tests: Recent Labs  Lab 07/05/22 1210  AST 17  ALT 12  ALKPHOS 90  BILITOT 0.7  PROT 6.8  ALBUMIN 3.8   No results for input(s): "LIPASE", "AMYLASE" in the last 168 hours. No results for input(s): "AMMONIA" in the last 168 hours.  ABG No results found for: "PHART", "PCO2ART", "PO2ART", "HCO3", "TCO2", "ACIDBASEDEF", "O2SAT"   Coagulation Profile: Recent Labs  Lab 07/05/22 1210  INR 1.1    Cardiac Enzymes: No results for input(s): "CKTOTAL", "CKMB", "CKMBINDEX", "TROPONINI" in the last 168 hours.  HbA1C: Hgb A1c MFr Bld  Date/Time Value Ref Range Status  06/28/2022 08:45 AM 5.9 4.6 - 6.5 % Final    Comment:    Glycemic Control Guidelines for People with Diabetes:Non Diabetic:  <6%Goal of Therapy: <7%Additional Action Suggested:  >8%   05/19/2021 10:13 AM 5.7 4.6 - 6.5 % Final    Comment:    Glycemic Control Guidelines for People with Diabetes:Non Diabetic:  <6%Goal of Therapy: <7%Additional Action Suggested:  >8%     CBG: Recent Labs  Lab 07/05/22 1202  GLUCAP 85     Past Medical History:  She,  has a past medical history of ADD (attention deficit disorder), Alcohol abuse,  Anemia, Anxiety, Aortic atherosclerosis ([place]) (02/20/2018), Arthritis, Asthma, Bipolar disorder ([place]), Constipation, Degenerative disc disease at L5-S1 level (09/28/2016), Depression, Diabetes mellitus, type 2 ([place]), Diverticulitis (2013), DOE (dyspnea on exertion), Drug use, GERD (gastroesophageal reflux disease), H/O suicide attempt, Hepatitis C (06/26/2014), Hip pain, History of echocardiogram, History of MRSA infection (2013), HLD (hyperlipidemia), Hypertension, Hyponatremia, Hypothyroidism, Incisional hernia (11/08/2012), Knee pain, Multinodular thyroid, OSA (obstructive sleep apnea), Osteoporosis, Palpitations, Post-traumatic osteoarthritis of right knee (09/24/2015), Recurrent ventral hernia (11/08/2012), Status post total right knee replacement using cement (05/10/2016), Vitamin B12 deficiency, and Vitamin D deficiency disease.   Surgical History:   Past Surgical History:  Procedure Laterality Date   BILATERAL SALPINGOOPHORECTOMY     due to abnormal mass   BREAST BIOPSY Left    neg   BREAST SURGERY Left 20 yrs ago   CESAREAN SECTION     COLONOSCOPY     COLONOSCOPY WITH PROPOFOL  N/A 10/16/2017   Procedure: COLONOSCOPY WITH PROPOFOL;  Surgeon: Jonathon Bellows, MD;  Location: [facility] ENDOSCOPY;  Service: Gastroenterology;  Laterality: N/A;   COLONOSCOPY WITH PROPOFOL N/A 02/16/2021   Procedure: COLONOSCOPY WITH PROPOFOL;  Surgeon: Jonathon Bellows, MD;  Location: [facility] ENDOSCOPY;  Service: Gastroenterology;  Laterality: N/A;   HERNIA REPAIR  06/2011, July 2014   Ventral wall repair with Physiomesh   HERNIA REPAIR     2nd.vental wall repair   JOINT REPLACEMENT Right    knee   TONSILLECTOMY     TOTAL HIP ARTHROPLASTY Left 07/23/2019   Procedure: TOTAL HIP ARTHROPLASTY;  Surgeon: Corky Mull, MD;  Location: [facility] ORS;  Service: Orthopedics;  Laterality: Left;   TOTAL KNEE ARTHROPLASTY Right 05/10/2016   Procedure: TOTAL KNEE ARTHROPLASTY;  Surgeon: Corky Mull, MD;  Location: [facility] ORS;  Service:  Orthopedics;  Laterality: Right;   TUBAL LIGATION       Social History:   reports that she has been smoking cigarettes. She has a 20.50 pack-year smoking history. She has never used smokeless tobacco. She reports that she does not drink alcohol and does not use drugs.   Family History:  Her family history includes Alcohol abuse in her father, mother, sister, sister, son, and son; Arthritis in her brother and mother; Asthma in her mother; Bipolar disorder in her mother; COPD in her mother; Cancer in her brother; Congestive Heart Failure in her mother; Depression in her sister; Drug abuse in her daughter, sister, sister, son, and son; Eating disorder in her mother; Fibromyalgia in her sister; Heart attack in her father; Heart disease in her mother; Mental illness in her brother, mother, sister, sister, and sister; Obesity in her sister; Pneumonia in her sister; Thyroid disease in her mother. There is no history of Diabetes, Stroke, Breast cancer, or Thyroid cancer.   Allergies Allergies  Allergen Reactions   Wellbutrin [Bupropion]     Patient reports made " deathly sick " years ago at appointment 03/18/20.   Lasix [Furosemide] Other (See Comments)    Electrolyte imbalance     Home Medications  Prior to Admission medications   Medication Sig Start Date End Date Taking? Authorizing Provider  rosuvastatin (CRESTOR) 20 MG tablet Take 1 tablet (20 mg total) by mouth daily. 07/04/22   Kennyth Arnold, FNP  Semaglutide,0.25 or 0.'5MG'$ /DOS, 2 MG/3ML SOPN Inject 0.25 mg into the skin once a week. 07/04/22   Kennyth Arnold, FNP  Vitamin D, Ergocalciferol, (DRISDOL) 1.25 MG (50000 UNIT) CAPS capsule Take 1 capsule (50,000 Units total) by mouth every 7 (seven) days. 07/04/22   Kennyth Arnold, FNP  acetaminophen (TYLENOL) 325 MG tablet Take 650 mg by mouth every 6 (six) hours as needed.    [provider]  albuterol (VENTOLIN HFA) 108 (90 Base) MCG/ACT inhaler Inhale 2 puffs into the lungs every 6  (six) hours as needed for wheezing or shortness of breath. 06/02/22   Chesley Mires, MD  carvedilol (COREG) 25 MG tablet TAKE 1 TABLET BY MOUTH TWICE DAILY WITH MEAL. MONITOR HEART RATE 05/20/22   Leone Haven, MD  clotrimazole (LOTRIMIN) 1 % cream Apply 1 application topically 2 (two) times daily as needed.    [provider]  cyclobenzaprine (FLEXERIL) 5 MG tablet Take 5 mg by mouth 2 (two) times daily as needed for muscle spasms.  06/04/15   [provider]  DULoxetine (CYMBALTA) 20 MG capsule TAKE 1 CAPSULE BY MOUTH EVERY DAY WITH '30MG'$  CAPSULE 06/20/22  Ursula Alert, MD  DULoxetine (CYMBALTA) 30 MG capsule Take 1 capsule (30 mg total) by mouth daily. 03/24/22   Ursula Alert, MD  gabapentin (NEURONTIN) 300 MG capsule Take 600 mg by mouth 2 (two) times daily.     [provider]  glucose blood (ACCU-CHEK GUIDE) test strip TEST UP TO FOUR TIMES A DAY 06/03/21   Flinchum, Kelby Aline, FNP  HYDROcodone-acetaminophen (NORCO/VICODIN) 5-325 MG tablet Take 1 tablet by mouth 3 (three) times daily as needed for moderate pain.    [provider]  meloxicam (MOBIC) 15 MG tablet Take 15 mg by mouth daily. 09/16/19   [provider]  naloxone [facility]) nasal spray 4 mg/0.1 mL Place into the nose. 12/27/21   [provider]  telmisartan (MICARDIS) 80 MG tablet TAKE 1 TABLET(80 MG) BY MOUTH DAILY 05/05/22   Leone Haven, MD       Critical Care Time devoted to patient care services described in this note is 75 minutes.   Critical care was necessary to treat /prevent imminent and life-threatening deterioration.    Corrin [name], M.D.  [facility]  Medical Director [place] Director [facility] Cardio-Pulmonary Department

## 2022-07-05 NOTE — ED Notes (Signed)
Called [facility] spoke to [place] activated code stroke  1200

## 2022-07-05 NOTE — Telephone Encounter (Signed)
Patient's daughter called back and stated her Mother is at the hospital in the ED getting checked out.

## 2022-07-05 NOTE — ED Provider Notes (Signed)
[facility] Provider Note    Event Date/Time   First MD Initiated Contact with Patient 07/05/22 1224     (approximate)   History   Tingling   HPI  Susan [name] is a 62 y.o. female  with h/o HTN, DM, HLD, here with acute onset R sided numbness and discoordination this morning. Pt reports that she initially noticed some numbness/tingling in her R hand, then entire R side. She reached for an object with her R hand and missed it (she is RHD). No h/o similar sx. No vision changes. Activated as code stroke en route.       Physical Exam   Triage Vital Signs: ED Triage Vitals  Enc Vitals Group     BP 07/05/22 1202 (!) 168/127     Pulse Rate 07/05/22 1202 66     Resp 07/05/22 1202 16     Temp 07/05/22 1300 97.7 F (36.5 C)     Temp Source 07/05/22 1300 Oral     SpO2 07/05/22 1230 97 %     Weight 07/05/22 1218 229 lb (103.9 kg)     Height 07/05/22 1302 '5\' 2"'$  (1.575 m)     Head Circumference --      Peak Flow --      Pain Score --      Pain Loc --      Pain Edu? --      Excl. in [place]? --     Most recent vital signs: Vitals:   07/05/22 1935 07/05/22 2000  BP:  (!) 147/82  Pulse: 70 68  Resp: 19 (!) 31  Temp:    SpO2: 96% 93%     General: Awake, no distress.  CV:  Good peripheral perfusion. RRR. Resp:  Normal work of breathing.  Abd:  No distention.  Other:  Subtle R NLF flattening. Right sided weakness UE and LE. Diminished sensation to light touch RUE and RLE.   ED Results / Procedures / Treatments   Labs (all labs ordered are listed, but only abnormal results are displayed) Labs Reviewed  COMPREHENSIVE METABOLIC PANEL - Abnormal; Notable for the following components:      Result Value   Creatinine, Ser 1.06 (*)    GFR, Estimated 60 (*)    All other components within normal limits  MRSA NEXT GEN BY PCR, NASAL  MRSA NEXT GEN BY PCR, NASAL  PROTIME-INR  APTT  CBC  DIFFERENTIAL  ETHANOL  GLUCOSE, CAPILLARY  HIV ANTIBODY  (ROUTINE TESTING W REFLEX)  LIPID PANEL  BASIC METABOLIC PANEL  MAGNESIUM  PHOSPHORUS  CBC  CBG MONITORING, ED     EKG Sinus bradycardia, VR 56. PR 192, QRS 91, QTc 467. No acute ST elevations.   RADIOLOGY CT Head; NAICA   I also independently reviewed and agree with radiologist interpretations.   PROCEDURES:  Critical Care performed: Yes, see critical care procedure note(s)  .Critical Care  Performed by: Duffy Bruce, MD Authorized by: Duffy Bruce, MD   Critical care provider statement:    Critical care time (minutes):  30   Critical care time was exclusive of:  Separately billable procedures and treating other patients   Critical care was necessary to treat or prevent imminent or life-threatening deterioration of the following conditions:  CNS failure or compromise, cardiac failure and circulatory failure   Critical care was time spent personally by me on the following activities:  Development of treatment plan with patient or surrogate, discussions with consultants, evaluation  of patient's response to treatment, examination of patient, ordering and review of laboratory studies, ordering and review of radiographic studies, ordering and performing treatments and interventions, pulse oximetry, re-evaluation of patient's condition and review of old [place] ED: Medications  clevidipine (CLEVIPREX) infusion 0.5 mg/mL (0 mg/hr Intravenous Stopped 07/05/22 1924)   stroke: early stages of recovery book (has no administration in time range)  acetaminophen (TYLENOL) tablet 650 mg (has no administration in time range)    Or  acetaminophen (TYLENOL) 160 MG/5ML solution 650 mg (has no administration in time range)    Or  acetaminophen (TYLENOL) suppository 650 mg (has no administration in time range)  senna-docusate (Senokot-S) tablet 1 tablet (has no administration in time range)  pantoprazole (PROTONIX) injection 40 mg (has no administration in  time range)  Chlorhexidine Gluconate Cloth 2 % PADS 6 each (6 each Topical Given 07/05/22 1501)  sodium chloride flush (NS) 0.9 % injection 3 mL (3 mLs Intravenous Given 07/05/22 1211)  tenecteplase (TNKASE) injection for Stroke 25 mg (25 mg Intravenous Given 07/05/22 1221)  iohexol (OMNIPAQUE) 350 MG/ML injection 75 mL (75 mLs Intravenous Contrast Given 07/05/22 1221)     IMPRESSION / MDM / ASSESSMENT AND PLAN / ED COURSE  I reviewed the triage vital signs and the nursing notes.                              Differential diagnosis includes, but is not limited to, CVA, ICH, TIA, seizure, HTN emergency  Patient's presentation is most consistent with acute presentation with potential threat to life or bodily function.  The patient is on the cardiac monitor to evaluate for evidence of arrhythmia and/or significant heart rate changes  62 yo F with h/o HTN, HLD, DM, here with acute onset unilateral weakness, numbness. Activated as a CODE STROKE en route and Dr. Leonel Ramsay at bedside on arrival. CT head is negative. RIsks/benefits discussed, and given pt's age, good premorbid status, and signs c/w acute ischemia, decision made to give tPA with Dr. Leonel Ramsay. Will admit to Neuro ICU. Cardene started for HTN. Pt in agreement with this plan. No new focl neuro deficits. No recent trauma. No contraindications to tPA noted.   Will admit to Dr. Mortimer Fries in ICU.    FINAL CLINICAL IMPRESSION(S) / ED DIAGNOSES   Final diagnoses:  Ischemic stroke ([place])     Rx / DC Orders   ED Discharge Orders     None        Note:  This document was prepared using Dragon voice recognition software and may include unintentional dictation errors.   Duffy Bruce, MD 07/05/22 2029

## 2022-07-05 NOTE — Consult Note (Signed)
Neurology Consultation Reason for Consult: Right sided numbness Referring Physician: Ellender Hose, C  CC: Right sided numbness  History is obtained from:patient  HPI: Susan [name] is a 62 y.o. female with a history of DM, HTN, hyperlipidemia, who presents with right-sided numbness and discoordination started abruptly this morning.  She states that she initially noticed it in her hand, and then it was in her entire right side.  There is some paresthesia associated with it.  She states that then she reached for an object and "overshot it."  She was having some difficulty with walking, and therefore called 911 and was transported via EMS.   A code stroke was activated on arrival to the emergency department.  She was taken directly to CT where I evaluated her as a code stroke.  Given difficulty walking, trouble with coordination of her right side I did feel like this was a disabling symptom and therefore discussed risks, benefits, and alternatives of tenecteplase with the patient who agreed with proceeding.   LKW: 10 AM tnk given?:  Yes Thrombectomy?:  No, no LVO Premorbid modified rankin scale: 2   Past Medical History:  Diagnosis Date   ADD (attention deficit disorder)    Alcohol abuse    Anemia    Anxiety    Aortic atherosclerosis ([place]) 02/20/2018   Chest CT Sept 2019   Arthritis    rheumatoid arthritis   Asthma    Bipolar disorder ([place])    Constipation    Degenerative disc disease at L5-S1 level 09/28/2016   See ortho note May 2018   Depression    bipolar, hx of suicide attempt   Diabetes mellitus, type 2 ([place])    Diverticulitis 2013   DOE (dyspnea on exertion)    Drug use    GERD (gastroesophageal reflux disease)    H/O suicide attempt    slit wrists   Hepatitis C 06/26/2014   Hip pain    History of echocardiogram    a. 04/2019 Echo: EF >65%, nl RV fxn.   History of MRSA infection 2013   HLD (hyperlipidemia)    Hypertension    a. 06/2021 Renal Duplex: ? bilat RAS; b.  06/2021 CTA Abd: No signif RAS.   Hyponatremia    Hypothyroidism    Incisional hernia 11/08/2012   Knee pain    Multinodular thyroid    OSA (obstructive sleep apnea)    Osteoporosis    Palpitations    Post-traumatic osteoarthritis of right knee 09/24/2015   Recurrent ventral hernia 11/08/2012   Status post total right knee replacement using cement 05/10/2016   Vitamin B12 deficiency    Vitamin D deficiency disease      Family History  Problem Relation Age of Onset   Arthritis Mother    Asthma Mother    Mental illness Mother    Thyroid disease Mother    COPD Mother    Heart disease Mother    Congestive Heart Failure Mother    Alcohol abuse Mother    Eating disorder Mother    Bipolar disorder Mother    Alcohol abuse Father    Heart attack Father    Alcohol abuse Sister    Drug abuse Sister    Mental illness Sister    Mental illness Sister    Fibromyalgia Sister    Obesity Sister    Pneumonia Sister    Depression Sister    Mental illness Sister    Alcohol abuse Sister    Drug abuse Sister  Arthritis Brother    Mental illness Brother    Cancer Brother        non-hodkins lymphoma   Drug abuse Daughter    Drug abuse Son    Alcohol abuse Son    Drug abuse Son    Alcohol abuse Son    Diabetes Neg Hx    Stroke Neg Hx    Breast cancer Neg Hx    Thyroid cancer Neg Hx      Social History:  reports that she has been smoking cigarettes. She has a 20.50 pack-year smoking history. She has never used smokeless tobacco. She reports that she does not drink alcohol and does not use drugs.   Exam: Current vital signs: BP (!) 168/127 (BP Location: Left Arm)   Pulse 66   Resp 16  Vital signs in last 24 hours: Pulse Rate:  [66] 66 (02/27 1202) Resp:  [16] 16 (02/27 1202) BP: (168)/(127) 168/127 (02/27 1202)   Physical Exam  Appears well-developed and well-nourished.   Neuro: Mental Status: Patient is awake, alert, oriented to person, place, month, year, and  situation. Patient is able to give a clear and coherent history. No signs of aphasia or neglect Cranial Nerves: II: Visual Fields are full. Pupils are equal, round, and reactive to light.   III,IV, VI: EOMI without ptosis or diploplia.  V: Facial sensation is diminished on the right VII: Facial movement is notable for decreased nasolabial fold on the right Motor: She has good strength throughout the left side, on the right she has 4+/5 strength of the arm and 4/5 in the right leg  sensory: She has diminished sensation throughout the right side  Cerebellar: She has difficulty with coordination out of proportion to her weakness     I have reviewed labs in epic and the results pertinent to this consultation are: Glucose 85  I have reviewed the images obtained: CT head-no acute hemorrhage  Primary diagnosis: Acute ischemic stroke due to occlusion of unspecified cerebral artery  Secondary diagnoses: Hypertensive emergency Diabetes without hyperglycemia Essential hypertension Hyperlipidemia Bipolar disorder Aortic atherosclerosis  Impression: 62 year old female with acute onset of right-sided numbness and weakness/discoordination.  I discussed risks and benefits and alternatives of IV tenecteplase and the patient desired to proceed with this therapy.  She will need to be admitted for secondary risk factor modification as well as therapy evaluations after her post thrombolytic care.  Recommendations: - HgbA1c, fasting lipid panel - MRI of the brain without contrast - Frequent neuro checks -Maintain BP less than 180/105(Cleviprex has been ordered) - Echocardiogram - Prophylactic therapy-none for 24 hours - Risk factor modification - Telemetry monitoring - PT consult, OT consult, Speech consult -I have advised that the patient stop smoking.  This patient is critically ill and at significant risk of neurological worsening, death and care requires constant monitoring of vital  signs, hemodynamics,respiratory and cardiac monitoring, neurological assessment, discussion with family, other specialists and medical decision making of high complexity. I spent 35 minutes of neurocritical care time  in the care of  this patient. This was time spent independent of any time provided by nurse practitioner or PA.  Roland Rack, MD [facility] [phone]  If 7pm- 7am, please page neurology on call as listed in [place]. 07/05/2022  12:39 PM

## 2022-07-05 NOTE — Code Documentation (Addendum)
Stroke Response Nurse Documentation Code Documentation  Susan [name] is a 62 y.o. female arriving to [facility] via [facility] on 07/05/2022 with past medical hx of DM, HTN, hypothyroidism, GERD, OSA, HLD. On No antithrombotic. Code stroke was activated by ED.   Patient from home where she was LKW at 1000 and now complaining of right sided facial, leg, and arm numbness and tingling of right arm and incoordination. Patient reports that this morning she had acute onset right sided tingling and numbness that started in the right hand, went up the arm, to the right side of her face, and right leg. She also endorses right arm tingling. She then called 911. This Probation officer was contacted by charge [employer] upon patient's arrival to ED. This Probation officer instructed staff to activate code stroke based off symptoms and LKW.   Stroke team meets patient shortly after arriving to ED. Patient cleared for CT by Dr. Myrene Buddy. Patient to CT with team. Labs drawn and IV obtained in CT.  NIHSS 6, see documentation for details and code stroke times. Patient with right facial droop, right leg weakness, right limb ataxia, right decreased sensation, and dysarthria  on exam. The following imaging was completed:  CT Head and CTA. Patient is a candidate for IV Thrombolytic. TNK given in CT at 1221. Patient is not a candidate for IR due to no LVO on imaging, per MD. Patient with SBP >180, placed on cleviprex gtt per MD order.  Care Plan: post-thrombolytic vital signs and NIHSS (q15 min x2 hours, q30 min x 6 hours, q1 h x 16 h), stroke swallow screen per protocol.    Bedside handoff with ED RN Ermalinda Barrios.    Charise Carwin  Stroke Response RN

## 2022-07-06 ENCOUNTER — Inpatient Hospital Stay (HOSPITAL_COMMUNITY)
Admit: 2022-07-06 | Discharge: 2022-07-06 | Disposition: A | Discharge status: Home / Self Care | Payer: 59 | Attending: Internal Medicine | Admitting: Internal Medicine

## 2022-07-06 ENCOUNTER — Inpatient Hospital Stay: Payer: 59

## 2022-07-06 DIAGNOSIS — I6389 Other cerebral infarction: Secondary | ICD-10-CM

## 2022-07-06 LAB — ECHOCARDIOGRAM COMPLETE
AR max vel: 3.72 cm2
AV Area VTI: 4.3 cm2
AV Area mean vel: 3.87 cm2
AV Mean grad: 3 mmHg
AV Peak grad: 5.8 mmHg
Ao pk vel: 1.2 m/s
Height: 62 in
S' Lateral: 2.9 cm
Weight: 2253.98 oz

## 2022-07-06 LAB — CBC
HCT: 44.4 % (ref 36.0–46.0)
Hemoglobin: 15.1 g/dL — ABNORMAL HIGH (ref 12.0–15.0)
MCH: 31.5 pg (ref 26.0–34.0)
MCHC: 34 g/dL (ref 30.0–36.0)
MCV: 92.5 fL (ref 80.0–100.0)
Platelets: 194 10*3/uL (ref 150–400)
RBC: 4.8 MIL/uL (ref 3.87–5.11)
RDW: 11.8 % (ref 11.5–15.5)
WBC: 9.2 10*3/uL (ref 4.0–10.5)
nRBC: 0 % (ref 0.0–0.2)

## 2022-07-06 LAB — BASIC METABOLIC PANEL
Anion gap: 12 (ref 5–15)
BUN: 16 mg/dL (ref 8–23)
CO2: 24 mmol/L (ref 22–32)
Calcium: 9.3 mg/dL (ref 8.9–10.3)
Chloride: 100 mmol/L (ref 98–111)
Creatinine, Ser: 0.77 mg/dL (ref 0.44–1.00)
GFR, Estimated: >60 mL/min (ref >60)
Glucose, Bld: 104 mg/dL — ABNORMAL HIGH (ref 70–99)
Potassium: 3.5 mmol/L (ref 3.5–5.1)
Sodium: 136 mmol/L (ref 135–145)

## 2022-07-06 LAB — LIPID PANEL
Cholesterol: 211 mg/dL — ABNORMAL HIGH (ref 0–200)
HDL: 50 mg/dL (ref >40)
LDL Cholesterol: 128 mg/dL — ABNORMAL HIGH (ref 0–99)
Total CHOL/HDL Ratio: 4.2 RATIO
Triglycerides: 166 mg/dL — ABNORMAL HIGH (ref <150)
VLDL: 33 mg/dL (ref 0–40)

## 2022-07-06 LAB — GLUCOSE, CAPILLARY
Glucose-Capillary: 96 mg/dL (ref 70–99)
Glucose-Capillary: 91 mg/dL (ref 70–99)
Glucose-Capillary: 151 mg/dL — ABNORMAL HIGH (ref 70–99)

## 2022-07-06 LAB — MAGNESIUM: Magnesium: 2.4 mg/dL (ref 1.7–2.4)

## 2022-07-06 LAB — TROPONIN I (HIGH SENSITIVITY)
Troponin I (High Sensitivity): 10 ng/L (ref <18)
Troponin I (High Sensitivity): 16 ng/L (ref <18)

## 2022-07-06 LAB — PHOSPHORUS: Phosphorus: 5 mg/dL — ABNORMAL HIGH (ref 2.5–4.6)

## 2022-07-06 LAB — HIV ANTIBODY (ROUTINE TESTING W REFLEX): HIV Screen 4th Generation wRfx: NONREACTIVE

## 2022-07-06 MED ORDER — POTASSIUM CHLORIDE CRYS ER 20 MEQ PO TBCR
20.0000 meq | EXTENDED_RELEASE_TABLET | Freq: Once | ORAL | Status: AC
  Administered 2022-07-06: 20 meq via ORAL
  Filled 2022-07-06: qty 1

## 2022-07-06 MED ORDER — HYDRALAZINE HCL 20 MG/ML IJ SOLN
10.0000 mg | INTRAMUSCULAR | Status: DC | PRN
  Administered 2022-07-06: 10 mg via INTRAVENOUS
  Filled 2022-07-06: qty 1

## 2022-07-06 MED ORDER — MELOXICAM 7.5 MG PO TABS
15.0000 mg | ORAL_TABLET | Freq: Every day | ORAL | Status: DC
  Administered 2022-07-07: 15 mg via ORAL
  Filled 2022-07-06: qty 2

## 2022-07-06 MED ORDER — BIOTENE DRY MOUTH MT LIQD
15.0000 mL | OROMUCOSAL | Status: DC | PRN
  Filled 2022-07-06: qty 15

## 2022-07-06 MED ORDER — HYDRALAZINE HCL 20 MG/ML IJ SOLN: 20.0000 mg | INTRAMUSCULAR | Status: DC | PRN

## 2022-07-06 MED ORDER — INSULIN ASPART 100 UNIT/ML IJ SOLN: 0.0000 [IU] | Freq: Three times a day (TID) | INTRAMUSCULAR | Status: DC

## 2022-07-06 MED ORDER — ASPIRIN 81 MG PO TBEC
81.0000 mg | DELAYED_RELEASE_TABLET | Freq: Every day | ORAL | Status: DC
  Administered 2022-07-06 – 2022-07-07 (×2): 81 mg via ORAL
  Filled 2022-07-06 (×2): qty 1

## 2022-07-06 MED ORDER — ROSUVASTATIN CALCIUM 20 MG PO TABS
20.0000 mg | ORAL_TABLET | Freq: Every day | ORAL | Status: DC
  Administered 2022-07-06 – 2022-07-07 (×2): 20 mg via ORAL
  Filled 2022-07-06: qty 2
  Filled 2022-07-06: qty 1

## 2022-07-06 MED ORDER — DULOXETINE HCL 30 MG PO CPEP
50.0000 mg | ORAL_CAPSULE | Freq: Every day | ORAL | Status: DC
  Administered 2022-07-06 – 2022-07-07 (×2): 50 mg via ORAL
  Filled 2022-07-06 (×2): qty 1

## 2022-07-06 MED ORDER — CYCLOBENZAPRINE HCL 10 MG PO TABS: 5.0000 mg | ORAL_TABLET | Freq: Two times a day (BID) | ORAL | Status: DC | PRN

## 2022-07-06 MED ORDER — DULOXETINE HCL 30 MG PO CPEP: 30.0000 mg | ORAL_CAPSULE | Freq: Every day | ORAL | Status: DC

## 2022-07-06 MED ORDER — HYDROCODONE-ACETAMINOPHEN 5-325 MG PO TABS: 1.0000 | ORAL_TABLET | Freq: Three times a day (TID) | ORAL | Status: DC | PRN

## 2022-07-06 MED ORDER — GABAPENTIN 300 MG PO CAPS
600.0000 mg | ORAL_CAPSULE | Freq: Two times a day (BID) | ORAL | Status: DC
  Administered 2022-07-06 – 2022-07-07 (×3): 600 mg via ORAL
  Filled 2022-07-06 (×3): qty 2

## 2022-07-06 MED ORDER — VITAMIN D (ERGOCALCIFEROL) 1.25 MG (50000 UNIT) PO CAPS
50000.0000 [IU] | ORAL_CAPSULE | ORAL | Status: DC
  Administered 2022-07-06: 50000 [IU] via ORAL
  Filled 2022-07-06: qty 1

## 2022-07-06 MED ORDER — IRBESARTAN 150 MG PO TABS
150.0000 mg | ORAL_TABLET | Freq: Every day | ORAL | Status: DC
  Administered 2022-07-06 – 2022-07-07 (×2): 150 mg via ORAL
  Filled 2022-07-06 (×2): qty 1

## 2022-07-06 MED ORDER — CLOPIDOGREL BISULFATE 75 MG PO TABS
75.0000 mg | ORAL_TABLET | Freq: Every day | ORAL | Status: DC
  Administered 2022-07-06 – 2022-07-07 (×2): 75 mg via ORAL
  Filled 2022-07-06 (×2): qty 1

## 2022-07-06 MED ORDER — ALBUTEROL SULFATE (2.5 MG/3ML) 0.083% IN NEBU: 3.0000 mL | INHALATION_SOLUTION | Freq: Four times a day (QID) | RESPIRATORY_TRACT | Status: DC | PRN

## 2022-07-06 MED ORDER — MORPHINE SULFATE (PF) 2 MG/ML IV SOLN
2.0000 mg | INTRAVENOUS | Status: DC | PRN
  Administered 2022-07-06: 2 mg via INTRAVENOUS
  Filled 2022-07-06: qty 1

## 2022-07-06 MED ORDER — CARVEDILOL 25 MG PO TABS
25.0000 mg | ORAL_TABLET | Freq: Two times a day (BID) | ORAL | Status: DC
  Administered 2022-07-06 – 2022-07-07 (×2): 25 mg via ORAL
  Filled 2022-07-06 (×2): qty 1

## 2022-07-06 NOTE — Progress Notes (Signed)
Pt in MRI from 1150-1245 causing delay in NIH/neuro examination. Examination complete on arrival to unit at 1245, see epic.

## 2022-07-06 NOTE — Evaluation (Signed)
Physical Therapy Evaluation Patient Details Name: Susan [name] MRN: [id number] DOB: [birth year] Today's Date: 07/06/2022  History of Present Illness  Cherisa Littlepage is a 62 y/o F with PMH: HTN, DM2, HLD, ETOH use, THA, PTSD, cocaine, bipolar. Here with L thalamic infarct (per MRI); symptoms of R numbness and decreased coordination.  Clinical Impression  Patient seated edge of bed, just completing OT evaluation upon arrival to room.  Alert and oriented, follows commands and agreeable/eager to participate with session as appropriate.  Denies pain at this time; does endorse some improvement in R-sided sensory awareness since admission.  R LE grossly 4 to 4-/5 throughout; diminished sensation mid-calf distally to R LE (but able to localize light touch, no extinction).  Mild/mod deficits in motor planning and coordination, especially with functional activities (though patient reports some of this is baseline due to chronic back issues).  Currently requiring close sup for bed mobility; cga/min assist for sit/stand, basic transfers and gait (50' x2) with RW.  Demonstrates partially reciprocal stepping pattern with limited step height/length bilat; excessive R hip flexion to advance/clear LE with lateral heel whip in swing; limited heel strike/toe off with increased force of contact R LE to floor (partially improved with cuing) Would benefit from skilled PT to address above deficits and promote optimal return to PLOF.; Recommend transition to HHPT upon discharge from acute hospitalization.      Recommendations for follow up therapy are one component of a multi-disciplinary discharge planning process, led by the attending physician.  Recommendations may be updated based on patient status, additional functional criteria and insurance authorization.  Follow Up Recommendations Home health PT      Assistance Recommended at Discharge Frequent or constant Supervision/Assistance  Patient can return home with  the following  A little help with walking and/or transfers;A little help with bathing/dressing/bathroom    Equipment Recommendations  (has all equpiment needed)  Recommendations for Other Services       Functional Status Assessment Patient has had a recent decline in their functional status and demonstrates the ability to make significant improvements in function in a reasonable and predictable amount of time.     Precautions / Restrictions Precautions Precautions: Fall Restrictions Weight Bearing Restrictions: No      Mobility  Bed Mobility Overal bed mobility: Modified Independent                  Transfers Overall transfer level: Needs assistance Equipment used: Rolling walker (2 wheels) Transfers: Sit to/from Stand Sit to Stand: Min guard, Min assist           General transfer comment: cuing for hand placement to prevent pulling on RW    Ambulation/Gait Ambulation/Gait assistance: Min assist Gait Distance (Feet):  (50' x2) Assistive device: Rolling walker (2 wheels)         General Gait Details: partially reciprocal stepping pattern with limited step height/length bilat; excessive R hip flexion to advance/clear LE with lateral heel whip in swing; limited heel strike/toe off with increased force of contact R LE to floor (partially improved with cuing)  Stairs            Wheelchair Mobility    Modified Rankin (Stroke Patients Only)       Balance Overall balance assessment: Needs assistance Sitting-balance support: No upper extremity supported Sitting balance-Leahy Scale: Good Sitting balance - Comments: mild R lateral lean with fatigue and divided attention/cognitive tasks; spontaneously corrects with shift of attention   Standing balance support: Bilateral upper extremity  supported Standing balance-Leahy Scale: Fair                               Pertinent Vitals/Pain Pain Assessment Pain Assessment: No/denies pain     Home Living Family/patient expects to be discharged to:: Private residence Living Arrangements: Children (adult son lives in baseline; not available for assist throughout the day) Available Help at Discharge: Family;Friend(s);Available PRN/intermittently;Other (Comment) Type of Home: House Home Access: Stairs to enter   Entrance Stairs-Number of Steps: 5   Home Layout: Laundry or work area in basement;One level (family can assist with laundry immediately upon discharge) Home Equipment: Conservation officer, nature (2 wheels);Cane - single point;Toilet riser;Grab bars - toilet;Grab bars - tub/shower;Shower seat;Hand held shower head      Prior Function Prior Level of Function : Independent/Modified Independent             Mobility Comments: Indep with ADLs, household and community mobilization without assist device ADLs Comments: (I) ADLs. 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 Drives. Has small dog she cares for. Denies falls. Pt is looking forward to an [place] in August 2024 with a friend.     Hand Dominance        Extremity/Trunk Assessment   Upper Extremity Assessment RUE Deficits / Details: Stereognosis testing; pt able to identify 3/5 of the items. Decreased light touch sensation throughout the R hand, but pt able to identify 90% of light touches correctly (had difficulty once with R 5th finger, stating OT had touched on the dorsal surface rather than accurately on palmar). BIL hand grip symmetrical and 5/5. (per OT)    Lower Extremity Assessment Lower Extremity Assessment: RLE deficits/detail RLE Deficits / Details: grosly 4- to 4/5 throughout; decreased sensory awareness mid-calf distally (able to detect light touch, but muted compared to L). Limited coordination and motor planning, esp with gait efforts       Communication      Cognition Arousal/Alertness: Awake/alert Behavior During Therapy: WFL  for tasks assessed/performed Overall Cognitive Status: Within Functional Limits for tasks assessed                                 General Comments: Very eager to participate/progress        General Comments      Exercises     Assessment/Plan    PT Assessment Patient needs continued PT services  PT Problem List Decreased strength;Decreased activity tolerance;Decreased balance;Decreased mobility;Decreased coordination;Decreased safety awareness;Decreased knowledge of use of DME;Decreased cognition;Decreased knowledge of precautions;Obesity;Impaired sensation       PT Treatment Interventions DME instruction;Gait training;Stair training;Therapeutic exercise;Balance training;Functional mobility training;Therapeutic activities;Cognitive remediation;Neuromuscular re-education;Patient/family education    PT Goals (Current goals can be found in the Care Plan section)  Acute Rehab PT Goals Patient Stated Goal: to return home with family PT Goal Formulation: With patient/family Time For Goal Achievement: 07/20/22 Potential to Achieve Goals: Good    Frequency 7X/week     Co-evaluation               AM-PAC PT "6 Clicks" Mobility  Outcome Measure Help needed turning from your back to your side while in a flat bed without using bedrails?: A Little Help needed moving from lying on your back to sitting on the side of a flat bed without using bedrails?: A Little  Help needed moving to and from a bed to a chair (including a wheelchair)?: A Little Help needed standing up from a chair using your arms (e.g., wheelchair or bedside chair)?: A Little Help needed to walk in hospital room?: A Little Help needed climbing 3-5 steps with a railing? : A Lot 6 Click Score: 17    End of Session Equipment Utilized During Treatment: Gait belt Activity Tolerance: Patient tolerated treatment well Patient left: in bed;with call bell/phone within reach;with family/visitor present Nurse  Communication: Mobility status PT Visit Diagnosis: Hemiplegia and hemiparesis Hemiplegia - Right/Left: Right Hemiplegia - dominant/non-dominant: Dominant Hemiplegia - caused by: Cerebral infarction    Time: 1440-1514 PT Time Calculation (min) (ACUTE ONLY): 34 min   Charges:   PT Evaluation $PT Eval Moderate Complexity: 1 Mod PT Treatments $Neuromuscular Re-education: 8-22 mins        Lehua Flores H. Owens Shark, PT, DPT, NCS 07/06/22, 9:50 PM [phone]

## 2022-07-06 NOTE — Hospital Course (Addendum)
62 y.o.f w/ past medical hx of DM, HTN, hypothyroidism, GERD, OSA, HLD presented as code stroke with RT sided Weakness  from home where she was LKW at 1000 and complaining of right sided facial, leg, and arm numbness and tingling of right arm and incoordination. Stroke team met patient shortly after arriving to ED>NIHSS 6- with right facial droop, right leg weakness, right limb ataxia, right decreased sensation, and dysarthria  on exams/p CT Head and CTA NEG, s/p IV TNK  at 1221 07/05/22.  Patient was admitted to ICU post thrombolytics. Patient with SBP >180, placed on cleviprex gtt.

## 2022-07-06 NOTE — Progress Notes (Signed)
*  PRELIMINARY RESULTS* Echocardiogram 2D Echocardiogram has been performed.  Sherrie Sport 07/06/2022, 2:00 PM

## 2022-07-06 NOTE — Progress Notes (Signed)
Subjective: Modest improvement  Exam: Vitals:   07/06/22 1400 07/06/22 1434  BP: (!) 165/110 135/83  Pulse: 91 80  Resp: (!) 21 (!) 21  Temp:    SpO2: 97% 97%   Gen: In bed, NAD Resp: non-labored breathing, no acute distress Abd: soft, nt  Neuro: MS: Awake, alert, interactive and appropriate CN: Visual fields full, EOMI, very mildly flattened nasolabial fold on the right Motor: She has 4/5 strength of the right arm and leg Sensory: Mildly decreased in both the right arm and leg  Pertinent Labs: LDL 128 CBGs have been normal  Impression: 62 year old female with history of tobacco abuse, hypertension, hyperlipidemia who presents with left thalamic infarct which is likely small vessel in etiology.  She will need management of her small vessel risk factors including high intensity statin therapy (was just started the day of admission), diabetes control with a goal LDL less than seven, gradual normalization of her blood pressures over the next week, and tobacco cessation.  Recommendations: 1) Crestor 20 mg nightly (was on this prior to arrival but had only taken a single dose) 2) dual antiplatelet therapy for 3 weeks followed by aspirin monotherapy 3) gradual BP reduction with goal normotension over the next week or two 4) continue DM control with goal A1c less than seven 5) I discussed tobacco cessation with the patient  6) she will need PT, OT, ST  Roland Rack, MD [facility] [phone]  If 7pm- 7am, please page neurology on call as listed in [place].

## 2022-07-06 NOTE — Progress Notes (Signed)
PT Cancellation Note  Patient Details Name: Susan [name] MRN: [id number] DOB: 03-05-1961   Cancelled Treatment:    Reason Eval/Treat Not Completed: Patient not medically ready. Patient received TNK at 1221 yesterday. Strict bedrest for 24 hours. Will follow up this pm as time allows.      Asaad Gulley 07/06/2022, 9:31 AM

## 2022-07-06 NOTE — Progress Notes (Signed)
SLP Cancellation Note  Patient Details Name: Susan [name] MRN: [id number] DOB: Aug 23, 1960   Cancelled treatment:       Reason Eval/Treat Not Completed: SLP screened, no needs identified, will sign off (chart reviewed; consulted NSG then met w/ pt)  Pt denied any difficulty swallowing and is currently on a regular diet; tolerates swallowing pills w/ water per NSG. Pt consumed sips of liquids during this visit w/out deficits noted. Pt conversed in conversation w/out expressive/receptive deficits noted; pt denied any speech-language deficits. Speech intelligible. Pt c/o weak grip in RUE. Of note, pt appeared to have Dry Mouth -- pt endorsed it is "Chronic for me at home; I have it all the time". Encouraged Water drinking, ice chips, and reached out to Pharmacist who ordered Biotene for her. Pt agreed. No further skilled ST services indicated as pt appears at her baseline. Pt agreed. NSG to reconsult if any change in status while admitted.      Orinda Kenner, MS, CCC-SLP Speech Language Pathologist Rehab Services; [place] [phone] (ascom) Tashae Inda 07/06/2022, 12:50 PM

## 2022-07-06 NOTE — Progress Notes (Signed)
PROGRESS NOTE Susan [name]  [id number] DOB: 1960/05/18 DOA: 07/05/2022 PCP: Leone Haven, MD   Brief Narrative/Hospital Course: 62 y.o.f w/ past medical hx of DM, HTN, hypothyroidism, GERD, OSA, HLD presented as code stroke with RT sided Weakness  from home where she was LKW at 1000 and complaining of right sided facial, leg, and arm numbness and tingling of right arm and incoordination. Stroke team met patient shortly after arriving to ED>NIHSS 6- with right facial droop, right leg weakness, right limb ataxia, right decreased sensation, and dysarthria  on exams/p CT Head and CTA NEG, s/p IV TNK  at 1221 07/05/22.  Patient was admitted to ICU post thrombolytics. Patient with SBP >180, placed on cleviprex gtt.     Subjective: Seen and examined this morning resting on the bedside chair. Earlier she had chest discomfort and also some shortness of breath reports this is not new for her she occasionally gets this at home especially during night and also did not use CPAP last night. EKG obtained and reviewed, troponin was ordered Complains of some numbness on the right arm otherwise no weakness.   Assessment and Plan: Principal Problem:   CVA (cerebrovascular accident) ([place])  Acute stroke with right-sided weakness: S/P thrombolytics and significant improvement in her weakness complains of mild numbness tingling on the right arm. Complete stroke workup as per neurology, MRI brain, echocardiogram, lipid.  LDL uncontrolled 128 goal less than 65, HbA1c 5.9 on 2/20.Patient had CTA head, neck no intracranial LVO vertebral arteries are patent,other findings- see report  - Frequent neuro checks - Prophylactic therapy-PENDING neuro inputs/repeat MRI at noon - Risk factor modification - Telemetry monitoring; 30 day event monitor on discharge if no arrythmias captured  - Blood pressure goal   - Post tPA for 24  hours < 180/105> was on Cleviprex drip discontinued 6 AM also received hydralazine>  discussed with neurology resuming Avapro only, continue to hold Coreg for now, continue as needed medication goal 160-180. - PT consult, OT consult, Speech consult  Enlarged thyroid gland: Will need nonemergent ultrasound neck  Hypothyroidism:con tsynthroid Vitamin D deficiency less than 20 on 2/20: Continue her home high-dose vitamin D replacement Diabetes mellitus: PTA meds semaglutide.  A1c well-controlled.  Keep on SSI for now. Recent Labs  Lab 07/05/22 1202 07/05/22 1454  GLUCAP 85 97    Hypertension: PTA on Coreg 25 twice daily, telmisartan 80 daily HLD: PTA on Crestor 20-resuming GERD: Continue PPI OSA-resume cpap bedtime- she is getting her brother to bring it over today On chronic opiates/Flexeril/Cymbalta/Neurontin resume home meds.  Mri resulted and reviewed- discussed w/ neuro and okay for transfer to tele  DVT prophylaxis: SCDs Start: 07/05/22 1514 Code Status:   Code Status: Full Code Family Communication: plan of care discussed with patient at bedside. Patient status is: inpatient because of acute stroke Level of care: ICU   Dispo: The patient is from: homeTBD            Anticipated disposition:  Objective: Vitals last 24 hrs: Vitals:   07/06/22 0730 07/06/22 0745 07/06/22 0800 07/06/22 0900  BP: (!) 164/89 (!) 119/90 (!) 167/73 (!) 147/87  Pulse: 81 82 90   Resp: '20 18 14 18  '$ Temp:      TempSrc:      SpO2: 100% 100% 97%   Weight:      Height:       Weight change:   Physical Examination: General exam: alert awake, older than stated age HEENT:Oral mucosa moist, Ear/Nose  WNL grossly Respiratory system: bilaterally CLEAR BS, no use of accessory muscle Cardiovascular system: S1 & S2 +, No JVD. Gastrointestinal system: Abdomen soft,NT,ND, BS+ Nervous System:Alert, awake, moving extremities. Extremities: LE edema NEG,distal peripheral pulses palpable.  Skin: No rashes,no icterus. MSK: Normal muscle bulk,tone, power  Medications reviewed:  Scheduled  Meds:  Chlorhexidine Gluconate Cloth  6 each Topical Daily   DULoxetine  30 mg Oral Daily   gabapentin  600 mg Oral BID   insulin aspart  0-6 Units Subcutaneous TID WC   irbesartan  150 mg Oral Daily   [START ON 07/07/2022] meloxicam  15 mg Oral Daily   pantoprazole (PROTONIX) IV  40 mg Intravenous QHS   rosuvastatin  20 mg Oral Daily   Vitamin D (Ergocalciferol)  50,000 Units Oral Q7 days   Continuous Infusions:    Diet Order             Diet heart healthy/carb modified Room service appropriate? Yes; Fluid consistency: Thin  Diet effective now                  Intake/Output Summary (Last 24 hours) at 07/06/2022 1001 Last data filed at 07/06/2022 0800 Gross per 24 hour  Intake 78.73 ml  Output 4075 ml  Net -3996.27 ml   Net IO Since Admission: -3,996.27 mL [07/06/22 1001]  Wt Readings from Last 3 Encounters:  07/05/22 63.9 kg  06/28/22 104 kg  06/02/22 99.7 kg     Unresulted Labs (From admission, onward)    None     Data Reviewed: I have personally reviewed following labs and imaging studies CBC: Recent Labs  Lab 07/05/22 1210 07/06/22 0528  WBC 8.9 9.2  NEUTROABS 4.7  --   HGB 13.7 15.1*  HCT 41.9 44.4  MCV 95.4 92.5  PLT 214 [id number]   Basic Metabolic Panel: Recent Labs  Lab 07/05/22 1210 07/06/22 0528  NA 135 136  K 4.4 3.5  CL 99 100  CO2 24 24  GLUCOSE 82 104*  BUN 20 16  CREATININE 1.06* 0.77  CALCIUM 9.1 9.3  MG  --  2.4  PHOS  --  5.0*   GFR: Estimated Creatinine Clearance: 64.8 mL/min (by C-G formula based on SCr of 0.77 mg/dL). Liver Function Tests: Recent Labs  Lab 07/05/22 1210  AST 17  ALT 12  ALKPHOS 90  BILITOT 0.7  PROT 6.8  ALBUMIN 3.8   Recent Labs  Lab 07/05/22 1210  INR 1.1   Recent Labs  Lab 07/05/22 1202 07/05/22 1454  GLUCAP 85 97   Lipid Profile: Recent Labs    07/06/22 0528  CHOL 211*  HDL 50  LDLCALC 128*  TRIG 166*  CHOLHDL 4.2   Recent Results (from the past 240 hour(s))  MRSA Next Gen by  PCR, Nasal     Status: None   Collection Time: 07/05/22  3:32 PM   Specimen: Nasal Mucosa; Nasal Swab  Result Value Ref Range Status   MRSA by PCR Next Gen NOT DETECTED NOT DETECTED Final    Comment: (NOTE) The GeneXpert MRSA Assay (FDA approved for NASAL specimens only), is one component of a comprehensive MRSA colonization surveillance program. It is not intended to diagnose MRSA infection nor to guide or monitor treatment for MRSA infections. Test performance is not FDA approved in patients less than 25 years old. Performed at [facility], [address] [place],  [address]     Antimicrobials: Anti-infectives (From admission, onward)    None  Culture/Microbiology    Component Value Date/Time   SDES  07/17/2019 0851    URINE, RANDOM Performed at [facility], [address] [place], [place] [address]    [facility]  07/17/2019 [phone]    NONE Performed at [facility], [address] [place], [place] [address]    CULT MULTIPLE SPECIES PRESENT, SUGGEST RECOLLECTION (A) 07/17/2019 0851   REPTSTATUS 07/18/2019 FINAL 07/17/2019 0851  Radiology Studies: CT ANGIO HEAD NECK W WO CM (CODE STROKE)  Result Date: 07/05/2022 CLINICAL DATA:  Neuro deficit, acute, stroke suspected. Additional history provided: Right-sided numbness. Last known normal 10 a.m. EXAM: CT ANGIOGRAPHY HEAD AND NECK TECHNIQUE: Multidetector CT imaging of the head and neck was performed using the standard protocol during bolus administration of intravenous contrast. Multiplanar CT image reconstructions and MIPs were obtained to evaluate the vascular anatomy. Carotid stenosis measurements (when applicable) are obtained utilizing NASCET criteria, using the distal internal carotid diameter as the denominator. RADIATION DOSE REDUCTION: This exam was performed according to the departmental dose-optimization program which includes automated exposure control, adjustment of the mA  and/or kV according to patient size and/or use of iterative reconstruction technique. CONTRAST:  74m OMNIPAQUE IOHEXOL 350 MG/ML SOLN COMPARISON:  Noncontrast head CT performed earlier today 07/05/2022. FINDINGS: CTA NECK FINDINGS Aortic arch: Standard aortic branching. Atherosclerotic plaque within the visualized aortic arch and proximal major branch vessels of the neck. Streak and beam hardening artifact arising from a dense right-sided contrast bolus partially obscures the right subclavian artery. Within this limitation, there is no appreciable hemodynamically significant innominate or proximal subclavian artery stenosis. Right carotid system: CCA and ICA patent within the neck without stenosis. Atherosclerotic plaque at the CCA origin and about the carotid bifurcation. Tortuosity of the cervical ICA. Left carotid system: CCA and ICA patent within the neck without stenosis or significant atherosclerotic disease. Vertebral arteries: Streak/beam hardening artifact arising from a dense right-sided contrast bolus partially obscures the V1 right vertebral artery. Within this limitation, the vertebral arteries are patent within the neck without stenosis. Skeleton: Reversal of the expected cervical lordosis. Grade 1 anterolisthesis at C2-C3 and C4-C5. Cervical spondylosis. No acute fracture or aggressive osseous lesion. Other neck: Enlarged and heterogeneous thyroid gland. Upper chest: No consolidation within the imaged lung apices. Review of the MIP images confirms the above findings CTA HEAD FINDINGS Anterior circulation: The intracranial internal carotid arteries are patent. Nonstenotic atherosclerotic plaque within both vessels. The M1 middle cerebral arteries are patent. Atherosclerotic irregularity of the M2 and more distal MCA vessels, bilaterally. No M2 proximal branch occlusion or high-grade proximal stenosis. The anterior cerebral arteries are patent. The left A1 segment is markedly hypoplastic or absent. 1  mm inferolaterally projecting vascular protrusion arising from the cavernous left internal carotid artery (series 8, image 125) (series 5, images 223 and 224). 2 mm inferiorly projecting vascular protrusion arising from the paraclinoid right internal carotid artery (for instance as seen on series 5, images 209-211). Posterior circulation: The intracranial vertebral arteries are patent. The basilar artery is patent. The posterior cerebral arteries are patent. Atherosclerotic irregularity of both vessels without high-grade proximal stenosis. A left posterior communicating artery is present. The right posterior communicating artery is diminutive or absent Venous sinuses: Within the limitations of contrast timing, no convincing thrombus. Anatomic variants: As described. Review of the MIP images confirms the above findings No emergent large vessel occlusion identified. These results were communicated to Dr. KLeonel Ramsayat 12:51 p[place] 2/27/2024by text page via the [facility] system. IMPRESSION: CTA  neck: 1. The common carotid and internal carotid arteries are patent within the neck without stenosis. Atherosclerotic plaque within the right common and cervical internal carotid arteries, as described. 2. Streak/beam hardening artifact partially obscures the V1 right vertebral artery. Within this limitation, the vertebral arteries are patent within the neck without stenosis. 3.  Aortic Atherosclerosis (ICD10-I70.0). 4. Enlarged and heterogeneous thyroid gland. A nonemergent thyroid ultrasound is recommended for further evaluation. Reference: J Am Coll Radiol. 2015 Feb;12(2): 143-50. CTA head: 1. Intracranial atherosclerotic disease, as described. No intracranial large vessel occlusion or proximal high-grade arterial stenosis is identified. 2. 1 mm vascular protrusion arising from the cavernous right internal carotid artery, which may reflect atherosclerotic irregularity or a small aneurysm. 3. 2 mm vascular protrusion  arising from the paraclinoid right internal carotid artery, which may reflect an aneurysm or infundibulum. Electronically Signed   By: Kellie Simmering D.O.   On: 07/05/2022 12:53   CT HEAD CODE STROKE WO CONTRAST  Result Date: 07/05/2022 CLINICAL DATA:  Code stroke. Neuro deficit, acute, stroke suspected. Right-sided numbness. Last known normal 10 a.m. EXAM: CT HEAD WITHOUT CONTRAST TECHNIQUE: Contiguous axial images were obtained from the base of the skull through the vertex without intravenous contrast. RADIATION DOSE REDUCTION: This exam was performed according to the departmental dose-optimization program which includes automated exposure control, adjustment of the mA and/or kV according to patient size and/or use of iterative reconstruction technique. COMPARISON:  Head CT 04/12/2021. FINDINGS: Brain: No age advanced or lobar predominant parenchymal atrophy. Partially empty sella turcica. There is no acute intracranial hemorrhage. No demarcated cortical infarct. No extra-axial fluid collection. No evidence of an intracranial mass. No midline shift. Vascular: No hyperdense vessel.  Atherosclerotic calcifications. Skull: No fracture or aggressive osseous lesion. Sinuses/Orbits: No mass or acute finding within the imaged orbits. Fluid level, and background mild mucosal thickening, within the left sphenoid sinus. ASPECTS [facility] Stroke Program Early CT Score) - Ganglionic level infarction (caudate, lentiform nuclei, internal capsule, insula, M1-M3 cortex): 7 - Supraganglionic infarction (M4-M6 cortex): 3 Total score (0-10 with 10 being normal): 10 No evidence of an acute intracranial abnormality. These results were communicated to Dr. Leonel Ramsay at 12:20 [place] 2/27/2024by text page via the [facility] messaging system. IMPRESSION: 1. No evidence of an acute intracranial abnormality. 2. Partially empty sella turcica. This finding can reflect incidental anatomic variation, or alternatively, it can be associated with  idiopathic intracranial hypertension (pseudotumor cerebri). 3. Left sphenoid sinusitis. Electronically Signed   By: Kellie Simmering D.O.   On: 07/05/2022 12:24     LOS: 1 day   Antonieta Pert, MD [facility]  07/06/2022, 10:01 AM

## 2022-07-06 NOTE — Evaluation (Signed)
Occupational Therapy Evaluation Patient Details Name: Susan [name] MRN: [id number] DOB: [birth year] Today's Date: 07/06/2022   History of Present Illness Susan [name] is a 62 y/o F with PMH: HTN, DM2, HLD, ETOH use, THA, PTSD, cocaine, bipolar. Here with L thalamic infarct (per MRI); symptoms of R numbness and decreased coordination.   Clinical Impression   Patient received for OT evaluation. See flowsheet below for details of function. Pt with deficits in R hand coordination and sensation; mild stereognosis deficit.  Patient will benefit from continued OT while in acute care.   Recommendations for follow up therapy are one component of a multi-disciplinary discharge planning process, led by the attending physician.  Recommendations may be updated based on patient status, additional functional criteria and insurance authorization.   Follow Up Recommendations  Outpatient OT     Assistance Recommended at Discharge Set up Supervision/Assistance  Patient can return home with the following Assistance with cooking/housework    Functional Status Assessment  Patient has had a recent decline in their functional status and demonstrates the ability to make significant improvements in function in a reasonable and predictable amount of time.  Equipment Recommendations  None recommended by OT    Recommendations for Other Services       Precautions / Restrictions Precautions Precautions: Fall Restrictions Weight Bearing Restrictions: No      Mobility Bed Mobility Overal bed mobility: Modified Independent (extra time to t/f to EOB)             General bed mobility comments: Good sitting balance at EOB.    Transfers                   General transfer comment: See PT note for details.      Balance Overall balance assessment:  (Good sitting balance)                                         ADL either performed or assessed with clinical judgement    ADL Overall ADL's : Modified independent                                       General ADL Comments: Pt able to don BIL socks at EOB. Discussed home safety with patient, such as avoiding hot or heavy items in R hand for safety in case pt dropped them. Provided neuromuscular re-education activities for sensory stimulation to R hand; pt and sister expressing understanding; agreeable to [place]. Pending good mobility with PT, pt should be able to complete all needed ADLs without assistance. Pt able to eat lunch today, open containers.     Vision Baseline Vision/History: 1 Wears glasses Ability to See in Adequate Light: 0 Adequate Patient Visual Report: No change from baseline Additional Comments: Patient able to read regular print with her glasses on.     Perception     Praxis      Pertinent Vitals/Pain Pain Assessment Pain Assessment: No/denies pain     Hand Dominance Right   Extremity/Trunk Assessment Upper Extremity Assessment Upper Extremity Assessment: RUE deficits/detail RUE Deficits / Details: Stereognosis testing; pt able to identify 3/5 of the items. Decreased light touch sensation throughout the R hand, but pt able to identify 90% of light touches correctly (had difficulty once with R 5th finger, stating  OT had touched on the dorsal surface rather than accurately on palmar). BIL hand grip symmetrical and 5/5. RUE Sensation: decreased light touch RUE Coordination: decreased fine motor (decreased finger-nose accuracy and speed on R; slower with eating spaghetti with RUE, although no dysmetria noted.)   Lower Extremity Assessment Lower Extremity Assessment: Defer to PT evaluation       Communication Communication Communication: No difficulties;Other (comment) (sister states that pt's speech sounds slightly slurred from baseline. Very intelligible.)   Cognition Arousal/Alertness: Awake/alert Behavior During Therapy: WFL for tasks assessed/performed Overall  Cognitive Status: Within Functional Limits for tasks assessed                                 General Comments: Social, pleasant. A+O. Good sense of humor. Very talkative.     General Comments       Exercises     Shoulder Instructions      Home Living Family/patient expects to be discharged to:: Private residence Living Arrangements: Children (adult son lives in basement; doesn't assist at baseline) Available Help at Discharge: Family;Friend(s);Available PRN/intermittently;Other (Comment) (Pts sister from [place] and brother from [place] currently in room; pt states she has multiple friends.) Type of Home: House Home Access: Stairs to enter Technical brewer of Steps: 5 Entrance Stairs-Rails: Right;Left Home Layout: Laundry or work area in basement;One level     Bathroom Shower/Tub: Teacher, early years/pre: Handicapped height Bathroom Accessibility: Yes How Accessible: Accessible via walker Home Equipment: [place] (2 wheels);Cane - single point;Toilet riser;Grab bars - toilet;Grab bars - tub/shower;Shower seat;Hand held shower head   Additional Comments: Pt states she does not use AD or shower seat at baseline, but has them. Bathroom is equipped with many grab bars 2/2 caring for her mother a while ago.      Prior Functioning/Environment Prior Level of Function : Independent/Modified Independent             Mobility Comments: (I) ADLs Comments: (I) ADLs. 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 Drives. Has small dog she cares for. Denies falls. Pt is looking forward to an [place] in August 2024 with a friend.        OT Problem List: Decreased coordination      OT Treatment/Interventions: Neuromuscular education    OT Goals(Current goals can be found in the care plan section) Acute Rehab OT Goals Patient Stated Goal: Go home; go on [place] cruise this  summer OT Goal Formulation: With patient/family Time For Goal Achievement: 07/20/22 Potential to Achieve Goals: Good ADL Goals Pt/caregiver will Perform Home Exercise Program: Right Upper extremity;Independently (For increased coordination and sensation) Additional ADL Goal #1: patient will demonstrate (I) with all BADLs by d/c.  OT Frequency: Min 2X/week    Co-evaluation              AM-PAC OT "6 Clicks" Daily Activity     Outcome Measure Help from another person eating meals?: None Help from another person taking care of personal grooming?: None Help from another person toileting, which includes using toliet, bedpan, or urinal?: None Help from another person bathing (including washing, rinsing, drying)?: None Help from another person to put on and taking off regular upper body clothing?: None Help from another person to put on and taking off regular lower body clothing?: None 6 Click Score: 24   End of Session Nurse  Communication: Mobility status  Activity Tolerance: Patient tolerated treatment well Patient left:  (saeted at EOB with family and PT in room)  OT Visit Diagnosis: Other symptoms and signs involving the nervous system [id number])                Time: [id number] OT Time Calculation (min): 48 min Charges:  OT General Charges $OT Visit: 1 Visit OT Evaluation $OT Eval High Complexity: 1 High OT Treatments $Neuromuscular Re-education: 8-22 mins  Waymon Amato, MS, OTR/L  Vania Rea 07/06/2022, 4:02 PM

## 2022-07-07 LAB — GLUCOSE, CAPILLARY
Glucose-Capillary: 117 mg/dL — ABNORMAL HIGH (ref 70–99)
Glucose-Capillary: 175 mg/dL — ABNORMAL HIGH (ref 70–99)

## 2022-07-07 MED ORDER — ASPIRIN 81 MG PO TBEC: 81.0000 mg | DELAYED_RELEASE_TABLET | Freq: Every day | ORAL | 12 refills | Status: DC

## 2022-07-07 MED ORDER — CLOPIDOGREL BISULFATE 75 MG PO TABS: 75.0000 mg | ORAL_TABLET | Freq: Every day | ORAL | 0 refills | Status: AC

## 2022-07-07 NOTE — Discharge Summary (Signed)
Physician Discharge Summary  Susan [name] [id number] DOB: [birth year] DOA: 07/05/2022  PCP: Leone Haven, MD  Admit date: 07/05/2022 Discharge date: 07/07/2022 Recommendations for Outpatient Follow-up:  Follow up with PCP/ neurology Dr Manuella Ghazi  in 1 weeks-call for appointment Will need nonemergent ultrasound neck -please discuss with PCP Please obtain BMP/CBC in one week  Discharge Dispo:Home w/ [facility] Discharge Condition: Stable Code Status:   Code Status: Full Code Diet recommendation:  Diet Order             Diet heart healthy/carb modified Room service appropriate? Yes; Fluid consistency: Thin  Diet effective now                   Brief/Interim Summary: 62 y.o.f w/ past medical hx of DM, HTN, hypothyroidism, GERD, OSA, HLD presented as code stroke with RT sided Weakness  from home where she was LKW at 1000 and complaining of right sided facial, leg, and arm numbness and tingling of right arm and incoordination. Stroke team met patient shortly after arriving to ED>NIHSS 6- with right facial droop, right leg weakness, right limb ataxia, right decreased sensation, and dysarthria  on exams/p CT Head and CTA NEG, s/p IV TNK  at 1221 07/05/22.  Patient was admitted to ICU post thrombolytics. Patient with SBP >180, placed on cleviprex gtt. Transferred out of ICU under [facility] service completed stroke workup with MRI brain with left thalamic infarct likely small vessel in etiology.  Echocardiogram unremarkable.LDL not at goal recently started Crestor which she will continue, dual antiplatelet x 3 weeks followed with aspirin monotherapy, gradual BP reduction with normotension over the next week or 2 setting up home health PT OT.  Discharge Diagnoses:  Principal Problem:   CVA (cerebrovascular accident) ([place])  Acute stroke with right-sided weakness: S/P thrombolytics and significant improvement in her weakness ompleted stroke workup with MRI brain with left thalamic infarct likely small  vessel in etiology.  Echocardiogram unremarkable.  LDL not at goal recently started Crestor which she will continue, dual antiplatelet x 3 weeks followed with aspirin monotherapy, gradual BP reduction with normotension over the next week or 2 setting up home health PT OT.   Enlarged thyroid gland:Will need nonemergent ultrasound neck- patient is instructed to see PCP.  She mentions she does have endocrinologist, I have advised her to follow-up on ultrasound at least from PCP to start with   Hypothyroidism:cont synthroid Vitamin D deficiency less than 20 on 2/20: Continue her home high-dose vitamin D replacement Diabetes mellitus: PTA meds semaglutide- resume  A1c well-controlled. Hypertension: Continue home meds plan to gradually reduce blood pressure follow-up with PCP in 1 week  HLD: PTA on Crestor 20 GERD: Continue PPI OSA-resume cpap bedtime- she is getting her brother to bring it over today On chronic opiates/Flexeril/Cymbalta/Neurontin  Consults: Neurology Subjective: Alert awake oriented resting comfortably, eager to go home has only minimal numbness tingling's in her right arm no weakness  Discharge Exam: Vitals:   07/07/22 0825 07/07/22 1143  BP: (!) 182/89 137/72  Pulse: 81 65  Resp: 18 20  Temp: 98.2 F (36.8 C) 98.4 F (36.9 C)  SpO2: 99% 96%   General: Pt is alert, awake, not in acute distress Cardiovascular: RRR, S1/S2 +, no rubs, no gallops Respiratory: CTA bilaterally, no wheezing, no rhonchi Abdominal: Soft, NT, ND, bowel sounds + Extremities: no edema, no cyanosis  Discharge Instructions  Discharge Instructions     Discharge instructions   Complete by: As directed    Please  call call MD or return to ER for similar or worsening recurring problem that brought you to hospital or if any fever,nausea/vomiting,abdominal pain, uncontrolled pain, chest pain,  shortness of breath or any other alarming symptoms.  Please follow-up your doctor as instructed in a week  time and call the office for appointment.  Please avoid alcohol, smoking, or any other illicit substance and maintain healthy habits including taking your regular medications as prescribed.  You were cared for by a hospitalist during your hospital stay. If you have any questions about your discharge medications or the care you received while you were in the hospital after you are discharged, you can call the unit and ask to speak with the hospitalist on call if the hospitalist that took care of you is not available.  Once you are discharged, your primary care physician will handle any further medical issues. Please note that NO REFILLS for any discharge medications will be authorized once you are discharged, as it is imperative that you return to your primary care physician (or establish a relationship with a primary care physician if you do not have one) for your aftercare needs so that they can reassess your need for medications and monitor your lab values   Increase activity slowly   Complete by: As directed       Allergies as of 07/07/2022       Reactions   Wellbutrin [bupropion]    Patient reports made " deathly sick " years ago at appointment 03/18/20.   Lasix [furosemide] Other (See Comments)   Electrolyte imbalance        Medication List     STOP taking these medications    meloxicam 15 MG tablet Commonly known as: MOBIC       TAKE these medications    Accu-Chek Guide test strip Generic drug: glucose blood TEST UP TO FOUR TIMES A DAY   acetaminophen 325 MG tablet Commonly known as: TYLENOL Take 650 mg by mouth every 6 (six) hours as needed.   albuterol 108 (90 Base) MCG/ACT inhaler Commonly known as: VENTOLIN HFA Inhale 2 puffs into the lungs every 6 (six) hours as needed for wheezing or shortness of breath.   aspirin EC 81 MG tablet Take 1 tablet (81 mg total) by mouth daily. Swallow whole. Start taking on: July 08, 2022   carvedilol 25 MG tablet Commonly  known as: COREG TAKE 1 TABLET BY MOUTH TWICE DAILY WITH MEAL. MONITOR HEART RATE   clopidogrel 75 MG tablet Commonly known as: PLAVIX Take 1 tablet (75 mg total) by mouth daily for 21 days. Start taking on: July 08, 2022   clotrimazole 1 % cream Commonly known as: LOTRIMIN Apply 1 application topically 2 (two) times daily as needed.   cyclobenzaprine 5 MG tablet Commonly known as: FLEXERIL Take 5 mg by mouth 2 (two) times daily as needed for muscle spasms.   DULoxetine 30 MG capsule Commonly known as: CYMBALTA Take 1 capsule (30 mg total) by mouth daily.   DULoxetine 20 MG capsule Commonly known as: CYMBALTA TAKE 1 CAPSULE BY MOUTH EVERY DAY WITH '30MG'$  CAPSULE   gabapentin 300 MG capsule Commonly known as: NEURONTIN Take 600 mg by mouth 2 (two) times daily.   HYDROcodone-acetaminophen 5-325 MG tablet Commonly known as: NORCO/VICODIN Take 1 tablet by mouth 3 (three) times daily as needed for moderate pain.   naloxone 4 MG/0.1ML Liqd nasal spray kit Commonly known as: NARCAN Place into the nose.   rosuvastatin 20 MG  tablet Commonly known as: Crestor Take 1 tablet (20 mg total) by mouth daily.   Semaglutide(0.25 or 0.'5MG'$ /DOS) 2 MG/3ML Sopn Inject 0.25 mg into the skin once a week.   telmisartan 80 MG tablet Commonly known as: MICARDIS TAKE 1 TABLET(80 MG) BY MOUTH DAILY   Vitamin D (Ergocalciferol) 1.25 MG (50000 UNIT) Caps capsule Commonly known as: DRISDOL Take 1 capsule (50,000 Units total) by mouth every 7 (seven) days.        Follow-up Information     Leone Haven, MD Follow up in 1 week(s).   Specialty: Family Medicine Contact information: [place] [address] [phone]         Vladimir Crofts, MD Follow up in 1 week(s).   Specialty: Neurology Contact information: [facility] [place] [address] [phone]         [facility] Follow up.    Specialty: Home Health Services Why: They will follow up with you for your home health therapy needs. Contact information: [place] [address] [phone]                Allergies  Allergen Reactions   Wellbutrin [Bupropion]     Patient reports made " deathly sick " years ago at appointment 03/18/20.   Lasix [Furosemide] Other (See Comments)    Electrolyte imbalance    The results of significant diagnostics from this hospitalization (including imaging, microbiology, ancillary and laboratory) are listed below for reference.    Microbiology: Recent Results (from the past 240 hour(s))  MRSA Next Gen by PCR, Nasal     Status: None   Collection Time: 07/05/22  3:32 PM   Specimen: Nasal Mucosa; Nasal Swab  Result Value Ref Range Status   MRSA by PCR Next Gen NOT DETECTED NOT DETECTED Final    Comment: (NOTE) The GeneXpert MRSA Assay (FDA approved for NASAL specimens only), is one component of a comprehensive MRSA colonization surveillance program. It is not intended to diagnose MRSA infection nor to guide or monitor treatment for MRSA infections. Test performance is not FDA approved in patients less than 31 years old. Performed at [facility], [address] [place], [place] [address]     Procedures/Studies: ECHOCARDIOGRAM COMPLETE  Result Date: 07/06/2022    ECHOCARDIOGRAM REPORT   Patient Name:   Susan [name] Date of Exam: 07/06/2022 Medical Rec #:  [id number]        Height:       62.0 in Accession #:    [id number]       Weight:       140.9 lb Date of Birth:  Jul 19, 1960        BSA:          1.647 m Patient Age:    62 years         BP:           147/87 mmHg Patient Gender: F                HR:           90 bpm. Exam Location:  [facility] Procedure: 2D Echo, Cardiac Doppler and Color Doppler Indications:     Stroke I63.9  History:         Patient has prior history of Echocardiogram examinations, most                  recent 01/22/2019. Risk  Factors:Hypertension.  Sonographer:     Sherrie Sport Referring Phys:  [id number] Flora Lipps Diagnosing Phys: Kate Sable MD  Sonographer Comments: Suboptimal apical window. Image acquisition challenging due to COPD. IMPRESSIONS  1. Left ventricular ejection fraction, by estimation, is 60 to 65%. The left ventricle has normal function. The left ventricle has no regional wall motion abnormalities. Left ventricular diastolic parameters are consistent with Grade I diastolic dysfunction (impaired relaxation).  2. Right ventricular systolic function is normal. The right ventricular size is normal.  3. The mitral valve is grossly normal. No evidence of mitral valve regurgitation.  4. The aortic valve was not well visualized. Aortic valve regurgitation is not visualized. FINDINGS  Left Ventricle: Left ventricular ejection fraction, by estimation, is 60 to 65%. The left ventricle has normal function. The left ventricle has no regional wall motion abnormalities. The left ventricular internal cavity size was normal in size. There is  no left ventricular hypertrophy. Left ventricular diastolic parameters are consistent with Grade I diastolic dysfunction (impaired relaxation). Right Ventricle: The right ventricular size is normal. No increase in right ventricular wall thickness. Right ventricular systolic function is normal. Left Atrium: Left atrial size was normal in size. Right Atrium: Right atrial size was normal in size. Pericardium: There is no evidence of pericardial effusion. Mitral Valve: The mitral valve is grossly normal. No evidence of mitral valve regurgitation. Tricuspid Valve: The tricuspid valve is grossly normal. Tricuspid valve regurgitation is not demonstrated. Aortic Valve: The aortic valve was not well visualized. Aortic valve regurgitation is not visualized. Aortic valve mean gradient measures 3.0 mmHg. Aortic valve peak gradient measures 5.8 mmHg. Aortic valve area, by VTI measures 4.30 cm. Pulmonic  Valve: The pulmonic valve was not well visualized. Pulmonic valve regurgitation is not visualized. Aorta: The aortic root is normal in size and structure. Venous: The inferior vena cava was not well visualized. IAS/Shunts: No atrial level shunt detected by color flow Doppler.  LEFT VENTRICLE PLAX 2D LVIDd:         4.50 cm LVIDs:         2.90 cm LV PW:         1.70 cm LV IVS:        1.60 cm LVOT diam:     2.10 cm LV SV:         83 LV SV Index:   51 LVOT Area:     3.46 cm  RIGHT VENTRICLE RV Basal diam:  2.60 cm RV Mid diam:    2.20 cm RV S prime:     15.10 cm/s TAPSE (M-mode): 3.2 cm LEFT ATRIUM             Index        RIGHT ATRIUM           Index LA diam:        3.60 cm 2.19 cm/m   RA Area:     15.10 cm LA Vol (A2C):   46.2 ml 28.05 ml/m  RA Volume:   33.60 ml  20.40 ml/m LA Vol (A4C):   61.3 ml 37.22 ml/m LA Biplane Vol: 57.4 ml 34.85 ml/m  AORTIC VALVE AV Area (Vmax):    3.72 cm AV Area (Vmean):   3.87 cm AV Area (VTI):     4.30 cm AV Vmax:           120.00 cm/s AV Vmean:          87.800 cm/s AV VTI:  0.194 m AV Peak Grad:      5.8 mmHg AV Mean Grad:      3.0 mmHg LVOT Vmax:         129.00 cm/s LVOT Vmean:        98.100 cm/s LVOT VTI:          0.241 m LVOT/AV VTI ratio: 1.24  AORTA Ao Root diam: 3.60 cm TRICUSPID VALVE TR Peak grad:   13.8 mmHg TR Vmax:        186.00 cm/s  SHUNTS Systemic VTI:  0.24 m Systemic Diam: 2.10 cm Kate Sable MD Electronically signed by Kate Sable MD Signature Date/Time: 07/06/2022/5:17:39 PM    Final    MR BRAIN WO CONTRAST  Result Date: 07/06/2022 CLINICAL DATA:  Altered mental status and bacteremia. Right-sided numbness and discoordination EXAM: MRI HEAD WITHOUT CONTRAST TECHNIQUE: Multiplanar, multiecho pulse sequences of the brain and surrounding structures were obtained without intravenous contrast. COMPARISON:  Head CT and CTA from earlier today FINDINGS: Brain: Small acute infarct in the left thalamus. Area of more weakly restricted diffusion  in the parasagittal left frontal lobe anteriorly. Small FLAIR hyperintensities in the cerebral white matter without restricted diffusion, usually chronic small vessel ischemia. Brain volume is normal. No hemorrhage, hydrocephalus, or collection. Vascular: Major flow voids are preserved Skull and upper cervical spine: Normal marrow signal Sinuses/Orbits: Mucosal thickening with secretions in the left sphenoid sinus. Negative orbits. IMPRESSION: 1. Small acute infarct in the left thalamus and small subacute infarct in the left frontal white matter. 2. Mild chronic small vessel disease. Electronically Signed   By: Jorje Guild M.D.   On: 07/06/2022 12:47   CT ANGIO HEAD NECK W WO CM (CODE STROKE)  Result Date: 07/05/2022 CLINICAL DATA:  Neuro deficit, acute, stroke suspected. Additional history provided: Right-sided numbness. Last known normal 10 a.m. EXAM: CT ANGIOGRAPHY HEAD AND NECK TECHNIQUE: Multidetector CT imaging of the head and neck was performed using the standard protocol during bolus administration of intravenous contrast. Multiplanar CT image reconstructions and MIPs were obtained to evaluate the vascular anatomy. Carotid stenosis measurements (when applicable) are obtained utilizing NASCET criteria, using the distal internal carotid diameter as the denominator. RADIATION DOSE REDUCTION: This exam was performed according to the departmental dose-optimization program which includes automated exposure control, adjustment of the mA and/or kV according to patient size and/or use of iterative reconstruction technique. CONTRAST:  51m OMNIPAQUE IOHEXOL 350 MG/ML SOLN COMPARISON:  Noncontrast head CT performed earlier today 07/05/2022. FINDINGS: CTA NECK FINDINGS Aortic arch: Standard aortic branching. Atherosclerotic plaque within the visualized aortic arch and proximal major branch vessels of the neck. Streak and beam hardening artifact arising from a dense right-sided contrast bolus partially obscures  the right subclavian artery. Within this limitation, there is no appreciable hemodynamically significant innominate or proximal subclavian artery stenosis. Right carotid system: CCA and ICA patent within the neck without stenosis. Atherosclerotic plaque at the CCA origin and about the carotid bifurcation. Tortuosity of the cervical ICA. Left carotid system: CCA and ICA patent within the neck without stenosis or significant atherosclerotic disease. Vertebral arteries: Streak/beam hardening artifact arising from a dense right-sided contrast bolus partially obscures the V1 right vertebral artery. Within this limitation, the vertebral arteries are patent within the neck without stenosis. Skeleton: Reversal of the expected cervical lordosis. Grade 1 anterolisthesis at C2-C3 and C4-C5. Cervical spondylosis. No acute fracture or aggressive osseous lesion. Other neck: Enlarged and heterogeneous thyroid gland. Upper chest: No consolidation within the imaged lung apices. Review  of the MIP images confirms the above findings CTA HEAD FINDINGS Anterior circulation: The intracranial internal carotid arteries are patent. Nonstenotic atherosclerotic plaque within both vessels. The M1 middle cerebral arteries are patent. Atherosclerotic irregularity of the M2 and more distal MCA vessels, bilaterally. No M2 proximal branch occlusion or high-grade proximal stenosis. The anterior cerebral arteries are patent. The left A1 segment is markedly hypoplastic or absent. 1 mm inferolaterally projecting vascular protrusion arising from the cavernous left internal carotid artery (series 8, image 125) (series 5, images 223 and 224). 2 mm inferiorly projecting vascular protrusion arising from the paraclinoid right internal carotid artery (for instance as seen on series 5, images 209-211). Posterior circulation: The intracranial vertebral arteries are patent. The basilar artery is patent. The posterior cerebral arteries are patent. Atherosclerotic  irregularity of both vessels without high-grade proximal stenosis. A left posterior communicating artery is present. The right posterior communicating artery is diminutive or absent Venous sinuses: Within the limitations of contrast timing, no convincing thrombus. Anatomic variants: As described. Review of the MIP images confirms the above findings No emergent large vessel occlusion identified. These results were communicated to Dr. Leonel Ramsay at 12:51 [place] 2/27/2024by text page via the [facility] messaging system. IMPRESSION: CTA neck: 1. The common carotid and internal carotid arteries are patent within the neck without stenosis. Atherosclerotic plaque within the right common and cervical internal carotid arteries, as described. 2. Streak/beam hardening artifact partially obscures the V1 right vertebral artery. Within this limitation, the vertebral arteries are patent within the neck without stenosis. 3.  Aortic Atherosclerosis (ICD10-I70.0). 4. Enlarged and heterogeneous thyroid gland. A nonemergent thyroid ultrasound is recommended for further evaluation. Reference: J Am Coll Radiol. 2015 Feb;12(2): 143-50. CTA head: 1. Intracranial atherosclerotic disease, as described. No intracranial large vessel occlusion or proximal high-grade arterial stenosis is identified. 2. 1 mm vascular protrusion arising from the cavernous right internal carotid artery, which may reflect atherosclerotic irregularity or a small aneurysm. 3. 2 mm vascular protrusion arising from the paraclinoid right internal carotid artery, which may reflect an aneurysm or infundibulum. Electronically Signed   By: Kellie Simmering D.O.   On: 07/05/2022 12:53   CT HEAD CODE STROKE WO CONTRAST  Result Date: 07/05/2022 CLINICAL DATA:  Code stroke. Neuro deficit, acute, stroke suspected. Right-sided numbness. Last known normal 10 a.m. EXAM: CT HEAD WITHOUT CONTRAST TECHNIQUE: Contiguous axial images were obtained from the base of the skull through the vertex  without intravenous contrast. RADIATION DOSE REDUCTION: This exam was performed according to the departmental dose-optimization program which includes automated exposure control, adjustment of the mA and/or kV according to patient size and/or use of iterative reconstruction technique. COMPARISON:  Head CT 04/12/2021. FINDINGS: Brain: No age advanced or lobar predominant parenchymal atrophy. Partially empty sella turcica. There is no acute intracranial hemorrhage. No demarcated cortical infarct. No extra-axial fluid collection. No evidence of an intracranial mass. No midline shift. Vascular: No hyperdense vessel.  Atherosclerotic calcifications. Skull: No fracture or aggressive osseous lesion. Sinuses/Orbits: No mass or acute finding within the imaged orbits. Fluid level, and background mild mucosal thickening, within the left sphenoid sinus. ASPECTS [facility] Stroke Program Early CT Score) - Ganglionic level infarction (caudate, lentiform nuclei, internal capsule, insula, M1-M3 cortex): 7 - Supraganglionic infarction (M4-M6 cortex): 3 Total score (0-10 with 10 being normal): 10 No evidence of an acute intracranial abnormality. These results were communicated to Dr. Leonel Ramsay at 12:20 [place] 2/27/2024by text page via the [facility] messaging system. IMPRESSION: 1. No evidence of an acute intracranial abnormality.  2. Partially empty sella turcica. This finding can reflect incidental anatomic variation, or alternatively, it can be associated with idiopathic intracranial hypertension (pseudotumor cerebri). 3. Left sphenoid sinusitis. Electronically Signed   By: Kellie Simmering D.O.   On: 07/05/2022 12:24    Labs: BNP (last 3 results) No results for input(s): "BNP" in the last 8760 hours. Basic Metabolic Panel: Recent Labs  Lab 07/05/22 1210 07/06/22 0528  NA 135 136  K 4.4 3.5  CL 99 100  CO2 24 24  GLUCOSE 82 104*  BUN 20 16  CREATININE 1.06* 0.77  CALCIUM 9.1 9.3  MG  --  2.4  PHOS  --  5.0*   Liver  Function Tests: Recent Labs  Lab 07/05/22 1210  AST 17  ALT 12  ALKPHOS 90  BILITOT 0.7  PROT 6.8  ALBUMIN 3.8   No results for input(s): "LIPASE", "AMYLASE" in the last 168 hours. No results for input(s): "AMMONIA" in the last 168 hours. CBC: Recent Labs  Lab 07/05/22 1210 07/06/22 0528  WBC 8.9 9.2  NEUTROABS 4.7  --   HGB 13.7 15.1*  HCT 41.9 44.4  MCV 95.4 92.5  PLT 214 194   Cardiac Enzymes: No results for input(s): "CKTOTAL", "CKMB", "CKMBINDEX", "TROPONINI" in the last 168 hours. BNP: Invalid input(s): "POCBNP" CBG: Recent Labs  Lab 07/06/22 1134 07/06/22 1727 07/06/22 2048 07/07/22 0823 07/07/22 1145  GLUCAP 96 91 151* 117* 175*   D-Dimer No results for input(s): "DDIMER" in the last 72 hours. Hgb A1c No results for input(s): "HGBA1C" in the last 72 hours. Lipid Profile Recent Labs    07/06/22 0528  CHOL 211*  HDL 50  LDLCALC 128*  TRIG 166*  CHOLHDL 4.2   Thyroid function studies No results for input(s): "TSH", "T4TOTAL", "T3FREE", "THYROIDAB" in the last 72 hours.  Invalid input(s): "FREET3" Anemia work up No results for input(s): "VITAMINB12", "FOLATE", "FERRITIN", "TIBC", "IRON", "RETICCTPCT" in the last 72 hours. Urinalysis    Component Value Date/Time   COLORURINE YELLOW 03/30/2021 1339   APPEARANCEUR Clear 10/21/2021 0910   LABSPEC <=1.005 (A) 03/30/2021 1339   LABSPEC 1.002 03/13/2013 1509   PHURINE 6.5 03/30/2021 1339   GLUCOSEU Negative 10/21/2021 0910   GLUCOSEU NEGATIVE 03/30/2021 1339   HGBUR NEGATIVE 03/30/2021 1339   BILIRUBINUR Negative 10/21/2021 0910   BILIRUBINUR Negative 03/13/2013 1509   KETONESUR NEGATIVE 03/30/2021 1339   PROTEINUR Negative 10/21/2021 0910   PROTEINUR NEGATIVE 07/17/2019 0850   UROBILINOGEN 0.2 10/05/2021 1532   UROBILINOGEN 0.2 03/30/2021 1339   NITRITE Negative 10/21/2021 0910   NITRITE NEGATIVE 03/30/2021 1339   LEUKOCYTESUR Negative 10/21/2021 0910   LEUKOCYTESUR NEGATIVE 03/30/2021  1339   LEUKOCYTESUR Negative 03/13/2013 1509   Sepsis Labs Recent Labs  Lab 07/05/22 1210 07/06/22 0528  WBC 8.9 9.2   Microbiology Recent Results (from the past 240 hour(s))  MRSA Next Gen by PCR, Nasal     Status: None   Collection Time: 07/05/22  3:32 PM   Specimen: Nasal Mucosa; Nasal Swab  Result Value Ref Range Status   MRSA by PCR Next Gen NOT DETECTED NOT DETECTED Final    Comment: (NOTE) The GeneXpert MRSA Assay (FDA approved for NASAL specimens only), is one component of a comprehensive MRSA colonization surveillance program. It is not intended to diagnose MRSA infection nor to guide or monitor treatment for MRSA infections. Test performance is not FDA approved in patients less than 19 years old. Performed at [facility], [place],  [place], [place] [address]      Time coordinating discharge: 25 minutes  SIGNED: Antonieta Pert, MD  [facility] Hospitalists 07/07/2022, 2:17 PM  If 7PM-7AM, please contact night-coverage [email or web address]

## 2022-07-07 NOTE — Progress Notes (Signed)
Pt d/c to home via family. IV removed intact. VSS. Education completed. All questions answered. All belongings sent with pt.

## 2022-07-07 NOTE — TOC Transition Note (Addendum)
Transition of Care [facility]) - CM/SW Discharge Note   Patient Details  Name: Susan [name] MRN: [id number] Date of Birth: 03-May-1961  [facility] CM/SW Contact:  Candie Chroman, LCSW Phone Number: 07/07/2022, 11:14 AM   Clinical Narrative:  Patient has orders to discharge home today. CSW met with patient. Family member at bedside. CSW introduced role and explained that therapy recommendations would be discussed. Patient is agreeable to home health. [facility] is first preference. They accepted referral for PT and OT. Per speech note, no skilled speech therapy needs indicated. No DME recommendations. Family member asked about counseling resources for patients newly diagnosed with cancer. Left message for social worker at [facility] cancer center to see if she had those specific resources. Placed psychiatrist/counseling list on chart to go home with her in case resources not received from cancer center CSW. No further concerns. CSW signing off.   Final next level of care: Home w Home Health Services Barriers to Discharge: Barriers Resolved   Patient Goals and CMS Choice CMS Medicare.gov Compare Post Acute Care list provided to:: Patient Choice offered to / list presented to : Patient  Discharge Placement                      Patient and family notified of of transfer: 07/07/22  Discharge Plan and Services Additional resources added to the After Visit Summary for                            [facility] Arranged: PT, OT [facility] Agency: [place] Date [place]: 07/07/22   Representative spoke with at [place]: Adela Lank  Social Determinants of Health ([place]) Interventions SDOH Screenings   Food Insecurity: No Food Insecurity (07/05/2022)  Housing: Low Risk  (07/05/2022)  Transportation Needs: No Transportation Needs (07/05/2022)  Utilities: Not At Risk (07/05/2022)  Alcohol Screen: Low Risk  (06/02/2021)  Depression (PHQ2-9): High Risk (06/28/2022)   Financial Resource Strain: Low Risk  (06/02/2021)  Physical Activity: Sufficiently Active (06/02/2021)  Social Connections: Moderately Isolated (06/02/2021)  Stress: No Stress Concern Present (06/02/2021)  Tobacco Use: High Risk (07/06/2022)     Readmission Risk Interventions     No data to display

## 2022-07-07 NOTE — Progress Notes (Deleted)
Physician Discharge Summary  Susan [name] [id number] DOB: 27-Mar-1961 DOA: 07/05/2022  PCP: Leone Haven, MD  Admit date: 07/05/2022 Discharge date: 07/07/2022 Recommendations for Outpatient Follow-up:  Follow up with PCP/ neurology Dr Manuella Ghazi  in 1 weeks-call for appointment Will need nonemergent ultrasound neck -please discuss with PCP Please obtain BMP/CBC in one week  Discharge Dispo:Home w/ [facility] Discharge Condition: Stable Code Status:   Code Status: Full Code Diet recommendation:  Diet Order             Diet heart healthy/carb modified Room service appropriate? Yes; Fluid consistency: Thin  Diet effective now                   Brief/Interim Summary: 62 y.o.f w/ past medical hx of DM, HTN, hypothyroidism, GERD, OSA, HLD presented as code stroke with RT sided Weakness  from home where she was LKW at 1000 and complaining of right sided facial, leg, and arm numbness and tingling of right arm and incoordination. Stroke team met patient shortly after arriving to ED>NIHSS 6- with right facial droop, right leg weakness, right limb ataxia, right decreased sensation, and dysarthria  on exams/p CT Head and CTA NEG, s/p IV TNK  at 1221 07/05/22.  Patient was admitted to ICU post thrombolytics. Patient with SBP >180, placed on cleviprex gtt. Transferred out of ICU under [facility] service completed stroke workup with MRI brain with left thalamic infarct likely small vessel in etiology.  Echocardiogram unremarkable.LDL not at goal recently started Crestor which she will continue, dual antiplatelet x 3 weeks followed with aspirin monotherapy, gradual BP reduction with normotension over the next week or 2 setting up home health PT OT.  Discharge Diagnoses:  Principal Problem:   CVA (cerebrovascular accident) ([place])  Acute stroke with right-sided weakness: S/P thrombolytics and significant improvement in her weakness ompleted stroke workup with MRI brain with left thalamic infarct likely small  vessel in etiology.  Echocardiogram unremarkable.  LDL not at goal recently started Crestor which she will continue, dual antiplatelet x 3 weeks followed with aspirin monotherapy, gradual BP reduction with normotension over the next week or 2 setting up home health PT OT.   Enlarged thyroid gland:Will need nonemergent ultrasound neck- patient is instructed to see PCP.  She mentions she does have endocrinologist, I have advised her to follow-up on ultrasound at least from PCP to start with   Hypothyroidism:cont synthroid Vitamin D deficiency less than 20 on 2/20: Continue her home high-dose vitamin D replacement Diabetes mellitus: PTA meds semaglutide- resume  A1c well-controlled. Hypertension: Continue home meds plan to gradually reduce blood pressure follow-up with PCP in 1 week  HLD: PTA on Crestor 20 GERD: Continue PPI OSA-resume cpap bedtime- she is getting her brother to bring it over today On chronic opiates/Flexeril/Cymbalta/Neurontin  Consults: Neurology Subjective: Alert awake oriented resting comfortably, eager to go home has only minimal numbness tingling's in her right arm no weakness  Discharge Exam: Vitals:   07/07/22 0450 07/07/22 0825  BP: 134/79 (!) 182/89  Pulse: 84 81  Resp: 17 18  Temp: 98.4 F (36.9 C) 98.2 F (36.8 C)  SpO2: 95% 99%   General: Pt is alert, awake, not in acute distress Cardiovascular: RRR, S1/S2 +, no rubs, no gallops Respiratory: CTA bilaterally, no wheezing, no rhonchi Abdominal: Soft, NT, ND, bowel sounds + Extremities: no edema, no cyanosis  Discharge Instructions  Discharge Instructions     Discharge instructions   Complete by: As directed    Please  call call MD or return to ER for similar or worsening recurring problem that brought you to hospital or if any fever,nausea/vomiting,abdominal pain, uncontrolled pain, chest pain,  shortness of breath or any other alarming symptoms.  Please follow-up your doctor as instructed in a week  time and call the office for appointment.  Please avoid alcohol, smoking, or any other illicit substance and maintain healthy habits including taking your regular medications as prescribed.  You were cared for by a hospitalist during your hospital stay. If you have any questions about your discharge medications or the care you received while you were in the hospital after you are discharged, you can call the unit and ask to speak with the hospitalist on call if the hospitalist that took care of you is not available.  Once you are discharged, your primary care physician will handle any further medical issues. Please note that NO REFILLS for any discharge medications will be authorized once you are discharged, as it is imperative that you return to your primary care physician (or establish a relationship with a primary care physician if you do not have one) for your aftercare needs so that they can reassess your need for medications and monitor your lab values   Increase activity slowly   Complete by: As directed       Allergies as of 07/07/2022       Reactions   Wellbutrin [bupropion]    Patient reports made " deathly sick " years ago at appointment 03/18/20.   Lasix [furosemide] Other (See Comments)   Electrolyte imbalance        Medication List     STOP taking these medications    meloxicam 15 MG tablet Commonly known as: MOBIC       TAKE these medications    Accu-Chek Guide test strip Generic drug: glucose blood TEST UP TO FOUR TIMES A DAY   acetaminophen 325 MG tablet Commonly known as: TYLENOL Take 650 mg by mouth every 6 (six) hours as needed.   albuterol 108 (90 Base) MCG/ACT inhaler Commonly known as: VENTOLIN HFA Inhale 2 puffs into the lungs every 6 (six) hours as needed for wheezing or shortness of breath.   aspirin EC 81 MG tablet Take 1 tablet (81 mg total) by mouth daily. Swallow whole. Start taking on: July 08, 2022   carvedilol 25 MG tablet Commonly  known as: COREG TAKE 1 TABLET BY MOUTH TWICE DAILY WITH MEAL. MONITOR HEART RATE   clopidogrel 75 MG tablet Commonly known as: PLAVIX Take 1 tablet (75 mg total) by mouth daily for 21 days. Start taking on: July 08, 2022   clotrimazole 1 % cream Commonly known as: LOTRIMIN Apply 1 application topically 2 (two) times daily as needed.   cyclobenzaprine 5 MG tablet Commonly known as: FLEXERIL Take 5 mg by mouth 2 (two) times daily as needed for muscle spasms.   DULoxetine 30 MG capsule Commonly known as: CYMBALTA Take 1 capsule (30 mg total) by mouth daily.   DULoxetine 20 MG capsule Commonly known as: CYMBALTA TAKE 1 CAPSULE BY MOUTH EVERY DAY WITH '30MG'$  CAPSULE   gabapentin 300 MG capsule Commonly known as: NEURONTIN Take 600 mg by mouth 2 (two) times daily.   HYDROcodone-acetaminophen 5-325 MG tablet Commonly known as: NORCO/VICODIN Take 1 tablet by mouth 3 (three) times daily as needed for moderate pain.   naloxone 4 MG/0.1ML Liqd nasal spray kit Commonly known as: NARCAN Place into the nose.   rosuvastatin 20 MG  tablet Commonly known as: Crestor Take 1 tablet (20 mg total) by mouth daily.   Semaglutide(0.25 or 0.'5MG'$ /DOS) 2 MG/3ML Sopn Inject 0.25 mg into the skin once a week.   telmisartan 80 MG tablet Commonly known as: MICARDIS TAKE 1 TABLET(80 MG) BY MOUTH DAILY   Vitamin D (Ergocalciferol) 1.25 MG (50000 UNIT) Caps capsule Commonly known as: DRISDOL Take 1 capsule (50,000 Units total) by mouth every 7 (seven) days.        Follow-up Information     Leone Haven, MD Follow up in 1 week(s).   Specialty: Family Medicine Contact information: [place] [address] [phone]         Vladimir Crofts, MD Follow up in 1 week(s).   Specialty: Neurology Contact information: [address] [facility] [place] [address] [phone]                Allergies  Allergen Reactions    Wellbutrin [Bupropion]     Patient reports made " deathly sick " years ago at appointment 03/18/20.   Lasix [Furosemide] Other (See Comments)    Electrolyte imbalance    The results of significant diagnostics from this hospitalization (including imaging, microbiology, ancillary and laboratory) are listed below for reference.    Microbiology: Recent Results (from the past 240 hour(s))  MRSA Next Gen by PCR, Nasal     Status: None   Collection Time: 07/05/22  3:32 PM   Specimen: Nasal Mucosa; Nasal Swab  Result Value Ref Range Status   MRSA by PCR Next Gen NOT DETECTED NOT DETECTED Final    Comment: (NOTE) The GeneXpert MRSA Assay (FDA approved for NASAL specimens only), is one component of a comprehensive MRSA colonization surveillance program. It is not intended to diagnose MRSA infection nor to guide or monitor treatment for MRSA infections. Test performance is not FDA approved in patients less than 26 years old. Performed at [facility], [address] [place], [place] [address]     Procedures/Studies: ECHOCARDIOGRAM COMPLETE  Result Date: 07/06/2022    ECHOCARDIOGRAM REPORT   Patient Name:   Susan [name] Date of Exam: 07/06/2022 Medical Rec #:  [id number]        Height:       62.0 in Accession #:    [id number]       Weight:       140.9 lb Date of Birth:  1960/12/19        BSA:          1.647 m Patient Age:    31 years         BP:           147/87 mmHg Patient Gender: F                HR:           90 bpm. Exam Location:  [facility] Procedure: 2D Echo, Cardiac Doppler and Color Doppler Indications:     Stroke I63.9  History:         Patient has prior history of Echocardiogram examinations, most                  recent 01/22/2019. Risk Factors:Hypertension.  Sonographer:     Sherrie Sport Referring Phys:  [id number] Flora Lipps Diagnosing Phys: Kate Sable MD  Sonographer Comments: Suboptimal apical window. Image acquisition challenging due to COPD. IMPRESSIONS  1. Left ventricular  ejection fraction, by estimation, is 60  to 65%. The left ventricle has normal function. The left ventricle has no regional wall motion abnormalities. Left ventricular diastolic parameters are consistent with Grade I diastolic dysfunction (impaired relaxation).  2. Right ventricular systolic function is normal. The right ventricular size is normal.  3. The mitral valve is grossly normal. No evidence of mitral valve regurgitation.  4. The aortic valve was not well visualized. Aortic valve regurgitation is not visualized. FINDINGS  Left Ventricle: Left ventricular ejection fraction, by estimation, is 60 to 65%. The left ventricle has normal function. The left ventricle has no regional wall motion abnormalities. The left ventricular internal cavity size was normal in size. There is  no left ventricular hypertrophy. Left ventricular diastolic parameters are consistent with Grade I diastolic dysfunction (impaired relaxation). Right Ventricle: The right ventricular size is normal. No increase in right ventricular wall thickness. Right ventricular systolic function is normal. Left Atrium: Left atrial size was normal in size. Right Atrium: Right atrial size was normal in size. Pericardium: There is no evidence of pericardial effusion. Mitral Valve: The mitral valve is grossly normal. No evidence of mitral valve regurgitation. Tricuspid Valve: The tricuspid valve is grossly normal. Tricuspid valve regurgitation is not demonstrated. Aortic Valve: The aortic valve was not well visualized. Aortic valve regurgitation is not visualized. Aortic valve mean gradient measures 3.0 mmHg. Aortic valve peak gradient measures 5.8 mmHg. Aortic valve area, by VTI measures 4.30 cm. Pulmonic Valve: The pulmonic valve was not well visualized. Pulmonic valve regurgitation is not visualized. Aorta: The aortic root is normal in size and structure. Venous: The inferior vena cava was not well visualized. IAS/Shunts: No atrial level shunt detected  by color flow Doppler.  LEFT VENTRICLE PLAX 2D LVIDd:         4.50 cm LVIDs:         2.90 cm LV PW:         1.70 cm LV IVS:        1.60 cm LVOT diam:     2.10 cm LV SV:         83 LV SV Index:   51 LVOT Area:     3.46 cm  RIGHT VENTRICLE RV Basal diam:  2.60 cm RV Mid diam:    2.20 cm RV S prime:     15.10 cm/s TAPSE (M-mode): 3.2 cm LEFT ATRIUM             Index        RIGHT ATRIUM           Index LA diam:        3.60 cm 2.19 cm/m   RA Area:     15.10 cm LA Vol (A2C):   46.2 ml 28.05 ml/m  RA Volume:   33.60 ml  20.40 ml/m LA Vol (A4C):   61.3 ml 37.22 ml/m LA Biplane Vol: 57.4 ml 34.85 ml/m  AORTIC VALVE AV Area (Vmax):    3.72 cm AV Area (Vmean):   3.87 cm AV Area (VTI):     4.30 cm AV Vmax:           120.00 cm/s AV Vmean:          87.800 cm/s AV VTI:            0.194 m AV Peak Grad:      5.8 mmHg AV Mean Grad:      3.0 mmHg LVOT Vmax:         129.00 cm/s LVOT Vmean:  98.100 cm/s LVOT VTI:          0.241 m LVOT/AV VTI ratio: 1.24  AORTA Ao Root diam: 3.60 cm TRICUSPID VALVE TR Peak grad:   13.8 mmHg TR Vmax:        186.00 cm/s  SHUNTS Systemic VTI:  0.24 m Systemic Diam: 2.10 cm Kate Sable MD Electronically signed by Kate Sable MD Signature Date/Time: 07/06/2022/5:17:39 PM    Final    MR BRAIN WO CONTRAST  Result Date: 07/06/2022 CLINICAL DATA:  Altered mental status and bacteremia. Right-sided numbness and discoordination EXAM: MRI HEAD WITHOUT CONTRAST TECHNIQUE: Multiplanar, multiecho pulse sequences of the brain and surrounding structures were obtained without intravenous contrast. COMPARISON:  Head CT and CTA from earlier today FINDINGS: Brain: Small acute infarct in the left thalamus. Area of more weakly restricted diffusion in the parasagittal left frontal lobe anteriorly. Small FLAIR hyperintensities in the cerebral white matter without restricted diffusion, usually chronic small vessel ischemia. Brain volume is normal. No hemorrhage, hydrocephalus, or collection.  Vascular: Major flow voids are preserved Skull and upper cervical spine: Normal marrow signal Sinuses/Orbits: Mucosal thickening with secretions in the left sphenoid sinus. Negative orbits. IMPRESSION: 1. Small acute infarct in the left thalamus and small subacute infarct in the left frontal white matter. 2. Mild chronic small vessel disease. Electronically Signed   By: Jorje Guild M.D.   On: 07/06/2022 12:47   CT ANGIO HEAD NECK W WO CM (CODE STROKE)  Result Date: 07/05/2022 CLINICAL DATA:  Neuro deficit, acute, stroke suspected. Additional history provided: Right-sided numbness. Last known normal 10 a.m. EXAM: CT ANGIOGRAPHY HEAD AND NECK TECHNIQUE: Multidetector CT imaging of the head and neck was performed using the standard protocol during bolus administration of intravenous contrast. Multiplanar CT image reconstructions and MIPs were obtained to evaluate the vascular anatomy. Carotid stenosis measurements (when applicable) are obtained utilizing NASCET criteria, using the distal internal carotid diameter as the denominator. RADIATION DOSE REDUCTION: This exam was performed according to the departmental dose-optimization program which includes automated exposure control, adjustment of the mA and/or kV according to patient size and/or use of iterative reconstruction technique. CONTRAST:  20m OMNIPAQUE IOHEXOL 350 MG/ML SOLN COMPARISON:  Noncontrast head CT performed earlier today 07/05/2022. FINDINGS: CTA NECK FINDINGS Aortic arch: Standard aortic branching. Atherosclerotic plaque within the visualized aortic arch and proximal major branch vessels of the neck. Streak and beam hardening artifact arising from a dense right-sided contrast bolus partially obscures the right subclavian artery. Within this limitation, there is no appreciable hemodynamically significant innominate or proximal subclavian artery stenosis. Right carotid system: CCA and ICA patent within the neck without stenosis. Atherosclerotic  plaque at the CCA origin and about the carotid bifurcation. Tortuosity of the cervical ICA. Left carotid system: CCA and ICA patent within the neck without stenosis or significant atherosclerotic disease. Vertebral arteries: Streak/beam hardening artifact arising from a dense right-sided contrast bolus partially obscures the V1 right vertebral artery. Within this limitation, the vertebral arteries are patent within the neck without stenosis. Skeleton: Reversal of the expected cervical lordosis. Grade 1 anterolisthesis at C2-C3 and C4-C5. Cervical spondylosis. No acute fracture or aggressive osseous lesion. Other neck: Enlarged and heterogeneous thyroid gland. Upper chest: No consolidation within the imaged lung apices. Review of the MIP images confirms the above findings CTA HEAD FINDINGS Anterior circulation: The intracranial internal carotid arteries are patent. Nonstenotic atherosclerotic plaque within both vessels. The M1 middle cerebral arteries are patent. Atherosclerotic irregularity of the M2 and more distal MCA vessels,  bilaterally. No M2 proximal branch occlusion or high-grade proximal stenosis. The anterior cerebral arteries are patent. The left A1 segment is markedly hypoplastic or absent. 1 mm inferolaterally projecting vascular protrusion arising from the cavernous left internal carotid artery (series 8, image 125) (series 5, images 223 and 224). 2 mm inferiorly projecting vascular protrusion arising from the paraclinoid right internal carotid artery (for instance as seen on series 5, images 209-211). Posterior circulation: The intracranial vertebral arteries are patent. The basilar artery is patent. The posterior cerebral arteries are patent. Atherosclerotic irregularity of both vessels without high-grade proximal stenosis. A left posterior communicating artery is present. The right posterior communicating artery is diminutive or absent Venous sinuses: Within the limitations of contrast timing, no  convincing thrombus. Anatomic variants: As described. Review of the MIP images confirms the above findings No emergent large vessel occlusion identified. These results were communicated to Dr. Leonel Ramsay at 12:51 pmon 2/27/2024by text page via the [facility] messaging system. IMPRESSION: CTA neck: 1. The common carotid and internal carotid arteries are patent within the neck without stenosis. Atherosclerotic plaque within the right common and cervical internal carotid arteries, as described. 2. Streak/beam hardening artifact partially obscures the V1 right vertebral artery. Within this limitation, the vertebral arteries are patent within the neck without stenosis. 3.  Aortic Atherosclerosis (ICD10-I70.0). 4. Enlarged and heterogeneous thyroid gland. A nonemergent thyroid ultrasound is recommended for further evaluation. Reference: J Am Coll Radiol. 2015 Feb;12(2): 143-50. CTA head: 1. Intracranial atherosclerotic disease, as described. No intracranial large vessel occlusion or proximal high-grade arterial stenosis is identified. 2. 1 mm vascular protrusion arising from the cavernous right internal carotid artery, which may reflect atherosclerotic irregularity or a small aneurysm. 3. 2 mm vascular protrusion arising from the paraclinoid right internal carotid artery, which may reflect an aneurysm or infundibulum. Electronically Signed   By: Kellie Simmering D.O.   On: 07/05/2022 12:53   CT HEAD CODE STROKE WO CONTRAST  Result Date: 07/05/2022 CLINICAL DATA:  Code stroke. Neuro deficit, acute, stroke suspected. Right-sided numbness. Last known normal 10 a.m. EXAM: CT HEAD WITHOUT CONTRAST TECHNIQUE: Contiguous axial images were obtained from the base of the skull through the vertex without intravenous contrast. RADIATION DOSE REDUCTION: This exam was performed according to the departmental dose-optimization program which includes automated exposure control, adjustment of the mA and/or kV according to patient size and/or  use of iterative reconstruction technique. COMPARISON:  Head CT 04/12/2021. FINDINGS: Brain: No age advanced or lobar predominant parenchymal atrophy. Partially empty sella turcica. There is no acute intracranial hemorrhage. No demarcated cortical infarct. No extra-axial fluid collection. No evidence of an intracranial mass. No midline shift. Vascular: No hyperdense vessel.  Atherosclerotic calcifications. Skull: No fracture or aggressive osseous lesion. Sinuses/Orbits: No mass or acute finding within the imaged orbits. Fluid level, and background mild mucosal thickening, within the left sphenoid sinus. ASPECTS [facility] Stroke Program Early CT Score) - Ganglionic level infarction (caudate, lentiform nuclei, internal capsule, insula, M1-M3 cortex): 7 - Supraganglionic infarction (M4-M6 cortex): 3 Total score (0-10 with 10 being normal): 10 No evidence of an acute intracranial abnormality. These results were communicated to Dr. Leonel Ramsay at 12:20 [place] 2/27/2024by text page via the [name] messaging system. IMPRESSION: 1. No evidence of an acute intracranial abnormality. 2. Partially empty sella turcica. This finding can reflect incidental anatomic variation, or alternatively, it can be associated with idiopathic intracranial hypertension (pseudotumor cerebri). 3. Left sphenoid sinusitis. Electronically Signed   By: Kellie Simmering D.O.   On: 07/05/2022 12:24  Labs: BNP (last 3 results) No results for input(s): "BNP" in the last 8760 hours. Basic Metabolic Panel: Recent Labs  Lab 07/05/22 1210 07/06/22 0528  NA 135 136  K 4.4 3.5  CL 99 100  CO2 24 24  GLUCOSE 82 104*  BUN 20 16  CREATININE 1.06* 0.77  CALCIUM 9.1 9.3  MG  --  2.4  PHOS  --  5.0*   Liver Function Tests: Recent Labs  Lab 07/05/22 1210  AST 17  ALT 12  ALKPHOS 90  BILITOT 0.7  PROT 6.8  ALBUMIN 3.8   No results for input(s): "LIPASE", "AMYLASE" in the last 168 hours. No results for input(s): "AMMONIA" in the last 168  hours. CBC: Recent Labs  Lab 07/05/22 1210 07/06/22 0528  WBC 8.9 9.2  NEUTROABS 4.7  --   HGB 13.7 15.1*  HCT 41.9 44.4  MCV 95.4 92.5  PLT 214 194   Cardiac Enzymes: No results for input(s): "CKTOTAL", "CKMB", "CKMBINDEX", "TROPONINI" in the last 168 hours. BNP: Invalid input(s): "POCBNP" CBG: Recent Labs  Lab 07/05/22 1454 07/06/22 1134 07/06/22 1727 07/06/22 2048 07/07/22 0823  GLUCAP 97 96 91 151* 117*   D-Dimer No results for input(s): "DDIMER" in the last 72 hours. Hgb A1c No results for input(s): "HGBA1C" in the last 72 hours. Lipid Profile Recent Labs    07/06/22 0528  CHOL 211*  HDL 50  LDLCALC 128*  TRIG 166*  CHOLHDL 4.2   Thyroid function studies No results for input(s): "TSH", "T4TOTAL", "T3FREE", "THYROIDAB" in the last 72 hours.  Invalid input(s): "FREET3" Anemia work up No results for input(s): "VITAMINB12", "FOLATE", "FERRITIN", "TIBC", "IRON", "RETICCTPCT" in the last 72 hours. Urinalysis    Component Value Date/Time   COLORURINE YELLOW 03/30/2021 1339   APPEARANCEUR Clear 10/21/2021 0910   LABSPEC <=1.005 (A) 03/30/2021 1339   LABSPEC 1.002 03/13/2013 1509   PHURINE 6.5 03/30/2021 1339   GLUCOSEU Negative 10/21/2021 0910   GLUCOSEU NEGATIVE 03/30/2021 1339   HGBUR NEGATIVE 03/30/2021 1339   BILIRUBINUR Negative 10/21/2021 0910   BILIRUBINUR Negative 03/13/2013 1509   KETONESUR NEGATIVE 03/30/2021 1339   PROTEINUR Negative 10/21/2021 0910   PROTEINUR NEGATIVE 07/17/2019 0850   UROBILINOGEN 0.2 10/05/2021 1532   UROBILINOGEN 0.2 03/30/2021 1339   NITRITE Negative 10/21/2021 0910   NITRITE NEGATIVE 03/30/2021 1339   LEUKOCYTESUR Negative 10/21/2021 0910   LEUKOCYTESUR NEGATIVE 03/30/2021 1339   LEUKOCYTESUR Negative 03/13/2013 1509   Sepsis Labs Recent Labs  Lab 07/05/22 1210 07/06/22 0528  WBC 8.9 9.2   Microbiology Recent Results (from the past 240 hour(s))  MRSA Next Gen by PCR, Nasal     Status: None   Collection  Time: 07/05/22  3:32 PM   Specimen: Nasal Mucosa; Nasal Swab  Result Value Ref Range Status   MRSA by PCR Next Gen NOT DETECTED NOT DETECTED Final    Comment: (NOTE) The GeneXpert MRSA Assay (FDA approved for NASAL specimens only), is one component of a comprehensive MRSA colonization surveillance program. It is not intended to diagnose MRSA infection nor to guide or monitor treatment for MRSA infections. Test performance is not FDA approved in patients less than 46 years old. Performed at [facility], [address] [place], [place] [address]      Time coordinating discharge: 25 minutes  SIGNED: Antonieta Pert, MD  [facility] 07/07/2022, 10:44 AM  If 7PM-7AM, please contact night-coverage [email or web address]

## 2022-07-07 NOTE — Progress Notes (Signed)
Subjective: Continues to show Modest improvement  Exam: Vitals:   07/07/22 0450 07/07/22 0825  BP: 134/79 (!) 182/89  Pulse: 84 81  Resp: 17 18  Temp: 98.4 F (36.9 C) 98.2 F (36.8 C)  SpO2: 95% 99%   Gen: In bed, NAD Resp: non-labored breathing, no acute distress Abd: soft, nt  Neuro: MS: Awake, alert, interactive and appropriate CN: Visual fields full, EOMI, R NL fold flattening is less apparent, still mild dysarthria.  Motor: She has 4+/5 strength of the right arm and leg Sensory: Mildly decreased in both the right arm and symmetric in the leg today  Pertinent Labs: LDL 128 CBGs have been normal  Impression: 62 year old female with history of tobacco abuse, hypertension, hyperlipidemia who presents with left thalamic infarct which is likely small vessel in etiology.  She will need management of her small vessel risk factors including high intensity statin therapy (was just started the day of admission), diabetes control with a goal LDL less than seven, gradual normalization of her blood pressures over the next week, and tobacco cessation.  Recommendations: 1) Crestor 20 mg nightly (was on this prior to arrival but had only taken a single dose) 2) dual antiplatelet therapy for 3 weeks followed by aspirin monotherapy 3) gradual BP reduction with goal normotension over the next week or two 4) continue DM control with goal A1c less than seven 5) I discussed tobacco cessation with the patient  6) Pt,OT,ST 7) Please call with further questions.   Roland Rack, MD [facility] [phone]  If 7pm- 7am, please page neurology on call as listed in [place].

## 2022-07-08 ENCOUNTER — Telehealth: Payer: Self-pay

## 2022-07-08 NOTE — Transitions of Care (Post Inpatient/ED Visit) (Signed)
   07/08/2022  Name: Susan [name] MRN: [id number] DOB: [birth year]  Today's TOC FU Call Status: Today's TOC FU Call Status:: Successful TOC FU Call Competed TOC FU Call Complete Date: 07/08/22  Transition Care Management Follow-up Telephone Call Date of Discharge: 07/22/22 Discharge Facility: [facility]) Type of Discharge: Inpatient Admission Primary Inpatient Discharge Diagnosis:: cerebral infarction How have you been since you were released from the hospital?: Better  Items Reviewed: Did you receive and understand the discharge instructions provided?: No Medications obtained and verified?: Yes (Medications Reviewed) Any new allergies since your discharge?: No Dietary orders reviewed?: Yes Do you have support at home?: Yes People in Home: sibling(s)  Home Care and Equipment/Supplies: [place] Ordered?: Yes Name of [place]:: [facility] Has Agency set up a time to come to your home?: No Any new equipment or medical supplies ordered?: NA  Functional Questionnaire: Do you need assistance with bathing/showering or dressing?: No Do you need assistance with meal preparation?: No Do you need assistance with eating?: No Do you have difficulty maintaining continence: No Do you need assistance with getting out of bed/getting out of a chair/moving?: No Do you have difficulty managing or taking your medications?: No  Folllow up appointments reviewed: PCP Follow-up appointment confirmed?: Yes Date of PCP follow-up appointment?: 07/22/22 Follow-up Provider: Dr Biagio Quint [facility] Follow-up appointment confirmed?: No Reason Specialist Follow-Up Not Confirmed: Patient has Specialist Provider Number and will Call for Appointment Do you need transportation to your follow-up appointment?: No Do you understand care options if your condition(s) worsen?: Yes-patient verbalized understanding    [place],  [place] Direct Dial [phone]

## 2022-07-09 DIAGNOSIS — E039 Hypothyroidism, unspecified: Secondary | ICD-10-CM | POA: Diagnosis not present

## 2022-07-09 DIAGNOSIS — G4733 Obstructive sleep apnea (adult) (pediatric): Secondary | ICD-10-CM | POA: Diagnosis not present

## 2022-07-09 DIAGNOSIS — J45909 Unspecified asthma, uncomplicated: Secondary | ICD-10-CM | POA: Diagnosis not present

## 2022-07-09 DIAGNOSIS — E785 Hyperlipidemia, unspecified: Secondary | ICD-10-CM | POA: Diagnosis not present

## 2022-07-09 DIAGNOSIS — E538 Deficiency of other specified B group vitamins: Secondary | ICD-10-CM | POA: Diagnosis not present

## 2022-07-09 DIAGNOSIS — E559 Vitamin D deficiency, unspecified: Secondary | ICD-10-CM | POA: Diagnosis not present

## 2022-07-09 DIAGNOSIS — I69351 Hemiplegia and hemiparesis following cerebral infarction affecting right dominant side: Secondary | ICD-10-CM | POA: Diagnosis not present

## 2022-07-09 DIAGNOSIS — M5137 Other intervertebral disc degeneration, lumbosacral region: Secondary | ICD-10-CM | POA: Diagnosis not present

## 2022-07-09 DIAGNOSIS — I69322 Dysarthria following cerebral infarction: Secondary | ICD-10-CM | POA: Diagnosis not present

## 2022-07-09 DIAGNOSIS — E119 Type 2 diabetes mellitus without complications: Secondary | ICD-10-CM | POA: Diagnosis not present

## 2022-07-09 DIAGNOSIS — F17210 Nicotine dependence, cigarettes, uncomplicated: Secondary | ICD-10-CM | POA: Diagnosis not present

## 2022-07-09 DIAGNOSIS — K219 Gastro-esophageal reflux disease without esophagitis: Secondary | ICD-10-CM | POA: Diagnosis not present

## 2022-07-09 DIAGNOSIS — K5790 Diverticulosis of intestine, part unspecified, without perforation or abscess without bleeding: Secondary | ICD-10-CM | POA: Diagnosis not present

## 2022-07-09 DIAGNOSIS — E042 Nontoxic multinodular goiter: Secondary | ICD-10-CM | POA: Diagnosis not present

## 2022-07-09 DIAGNOSIS — M810 Age-related osteoporosis without current pathological fracture: Secondary | ICD-10-CM | POA: Diagnosis not present

## 2022-07-09 DIAGNOSIS — Z7982 Long term (current) use of aspirin: Secondary | ICD-10-CM | POA: Diagnosis not present

## 2022-07-09 DIAGNOSIS — Z7902 Long term (current) use of antithrombotics/antiplatelets: Secondary | ICD-10-CM | POA: Diagnosis not present

## 2022-07-09 DIAGNOSIS — Z7985 Long-term (current) use of injectable non-insulin antidiabetic drugs: Secondary | ICD-10-CM | POA: Diagnosis not present

## 2022-07-09 DIAGNOSIS — I700 Atherosclerosis of aorta: Secondary | ICD-10-CM | POA: Diagnosis not present

## 2022-07-09 DIAGNOSIS — I10 Essential (primary) hypertension: Secondary | ICD-10-CM | POA: Diagnosis not present

## 2022-07-09 DIAGNOSIS — D649 Anemia, unspecified: Secondary | ICD-10-CM | POA: Diagnosis not present

## 2022-07-09 DIAGNOSIS — Z9981 Dependence on supplemental oxygen: Secondary | ICD-10-CM | POA: Diagnosis not present

## 2022-07-12 ENCOUNTER — Encounter: Payer: Self-pay | Admitting: Emergency Medicine

## 2022-07-12 ENCOUNTER — Emergency Department
Admission: EM | Admit: 2022-07-12 | Discharge: 2022-07-12 | Disposition: A | Discharge status: Home / Self Care | Payer: 59 | Attending: Emergency Medicine | Admitting: Emergency Medicine

## 2022-07-12 ENCOUNTER — Other Ambulatory Visit: Payer: Self-pay

## 2022-07-12 ENCOUNTER — Telehealth: Payer: Self-pay | Admitting: Family Medicine

## 2022-07-12 ENCOUNTER — Emergency Department: Payer: 59

## 2022-07-12 DIAGNOSIS — E559 Vitamin D deficiency, unspecified: Secondary | ICD-10-CM | POA: Diagnosis not present

## 2022-07-12 DIAGNOSIS — I69351 Hemiplegia and hemiparesis following cerebral infarction affecting right dominant side: Secondary | ICD-10-CM | POA: Diagnosis not present

## 2022-07-12 DIAGNOSIS — E039 Hypothyroidism, unspecified: Secondary | ICD-10-CM | POA: Diagnosis not present

## 2022-07-12 DIAGNOSIS — F17210 Nicotine dependence, cigarettes, uncomplicated: Secondary | ICD-10-CM | POA: Diagnosis not present

## 2022-07-12 DIAGNOSIS — I69322 Dysarthria following cerebral infarction: Secondary | ICD-10-CM | POA: Diagnosis not present

## 2022-07-12 DIAGNOSIS — Z7902 Long term (current) use of antithrombotics/antiplatelets: Secondary | ICD-10-CM | POA: Diagnosis not present

## 2022-07-12 DIAGNOSIS — G4489 Other headache syndrome: Secondary | ICD-10-CM | POA: Diagnosis not present

## 2022-07-12 DIAGNOSIS — E119 Type 2 diabetes mellitus without complications: Secondary | ICD-10-CM | POA: Diagnosis not present

## 2022-07-12 DIAGNOSIS — Z9981 Dependence on supplemental oxygen: Secondary | ICD-10-CM | POA: Diagnosis not present

## 2022-07-12 DIAGNOSIS — G4733 Obstructive sleep apnea (adult) (pediatric): Secondary | ICD-10-CM | POA: Diagnosis not present

## 2022-07-12 DIAGNOSIS — Z7985 Long-term (current) use of injectable non-insulin antidiabetic drugs: Secondary | ICD-10-CM | POA: Diagnosis not present

## 2022-07-12 DIAGNOSIS — K5790 Diverticulosis of intestine, part unspecified, without perforation or abscess without bleeding: Secondary | ICD-10-CM | POA: Diagnosis not present

## 2022-07-12 DIAGNOSIS — I10 Essential (primary) hypertension: Secondary | ICD-10-CM | POA: Diagnosis not present

## 2022-07-12 DIAGNOSIS — E042 Nontoxic multinodular goiter: Secondary | ICD-10-CM | POA: Diagnosis not present

## 2022-07-12 DIAGNOSIS — J45909 Unspecified asthma, uncomplicated: Secondary | ICD-10-CM | POA: Diagnosis not present

## 2022-07-12 DIAGNOSIS — E785 Hyperlipidemia, unspecified: Secondary | ICD-10-CM | POA: Diagnosis not present

## 2022-07-12 DIAGNOSIS — Z743 Need for continuous supervision: Secondary | ICD-10-CM | POA: Diagnosis not present

## 2022-07-12 DIAGNOSIS — Z7982 Long term (current) use of aspirin: Secondary | ICD-10-CM | POA: Diagnosis not present

## 2022-07-12 DIAGNOSIS — K219 Gastro-esophageal reflux disease without esophagitis: Secondary | ICD-10-CM | POA: Diagnosis not present

## 2022-07-12 DIAGNOSIS — R519 Headache, unspecified: Secondary | ICD-10-CM

## 2022-07-12 DIAGNOSIS — R6889 Other general symptoms and signs: Secondary | ICD-10-CM | POA: Diagnosis not present

## 2022-07-12 DIAGNOSIS — E538 Deficiency of other specified B group vitamins: Secondary | ICD-10-CM | POA: Diagnosis not present

## 2022-07-12 DIAGNOSIS — D649 Anemia, unspecified: Secondary | ICD-10-CM | POA: Diagnosis not present

## 2022-07-12 DIAGNOSIS — I700 Atherosclerosis of aorta: Secondary | ICD-10-CM | POA: Diagnosis not present

## 2022-07-12 DIAGNOSIS — M810 Age-related osteoporosis without current pathological fracture: Secondary | ICD-10-CM | POA: Diagnosis not present

## 2022-07-12 DIAGNOSIS — M5137 Other intervertebral disc degeneration, lumbosacral region: Secondary | ICD-10-CM | POA: Diagnosis not present

## 2022-07-12 LAB — CBC WITH DIFFERENTIAL/PLATELET
Abs Immature Granulocytes: 0.02 10*3/uL (ref 0.00–0.07)
Basophils Absolute: 0.1 10*3/uL (ref 0.0–0.1)
Basophils Relative: 1 %
Eosinophils Absolute: 0.3 10*3/uL (ref 0.0–0.5)
Eosinophils Relative: 3 %
HCT: 40.4 % (ref 36.0–46.0)
Hemoglobin: 13.7 g/dL (ref 12.0–15.0)
Immature Granulocytes: 0 %
Lymphocytes Relative: 34 %
Lymphs Abs: 3.5 10*3/uL (ref 0.7–4.0)
MCH: 31.5 pg (ref 26.0–34.0)
MCHC: 33.9 g/dL (ref 30.0–36.0)
MCV: 92.9 fL (ref 80.0–100.0)
Monocytes Absolute: 0.5 10*3/uL (ref 0.1–1.0)
Monocytes Relative: 5 %
Neutro Abs: 5.8 10*3/uL (ref 1.7–7.7)
Neutrophils Relative %: 57 %
Platelets: 207 10*3/uL (ref 150–400)
RBC: 4.35 MIL/uL (ref 3.87–5.11)
RDW: 11.5 % (ref 11.5–15.5)
WBC: 10.2 10*3/uL (ref 4.0–10.5)
nRBC: 0 % (ref 0.0–0.2)

## 2022-07-12 LAB — BASIC METABOLIC PANEL
Anion gap: 9 (ref 5–15)
BUN: 11 mg/dL (ref 8–23)
CO2: 24 mmol/L (ref 22–32)
Calcium: 8.9 mg/dL (ref 8.9–10.3)
Chloride: 101 mmol/L (ref 98–111)
Creatinine, Ser: 0.87 mg/dL (ref 0.44–1.00)
GFR, Estimated: >60 mL/min (ref >60)
Glucose, Bld: 113 mg/dL — ABNORMAL HIGH (ref 70–99)
Potassium: 3.5 mmol/L (ref 3.5–5.1)
Sodium: 134 mmol/L — ABNORMAL LOW (ref 135–145)

## 2022-07-12 LAB — TROPONIN I (HIGH SENSITIVITY): Troponin I (High Sensitivity): 9 ng/L (ref <18)

## 2022-07-12 MED ORDER — DIPHENHYDRAMINE HCL 50 MG/ML IJ SOLN
12.5000 mg | Freq: Once | INTRAMUSCULAR | Status: AC
  Administered 2022-07-12: 12.5 mg via INTRAVENOUS
  Filled 2022-07-12: qty 1

## 2022-07-12 MED ORDER — PROCHLORPERAZINE EDISYLATE 10 MG/2ML IJ SOLN
10.0000 mg | Freq: Once | INTRAMUSCULAR | Status: AC
  Administered 2022-07-12: 10 mg via INTRAVENOUS
  Filled 2022-07-12: qty 2

## 2022-07-12 MED ORDER — ACETAMINOPHEN 500 MG PO TABS
1000.0000 mg | ORAL_TABLET | Freq: Once | ORAL | Status: AC
  Administered 2022-07-12: 1000 mg via ORAL
  Filled 2022-07-12: qty 2

## 2022-07-12 NOTE — ED Triage Notes (Signed)
Pt presents via acems. Per patient just treated last week with TNK for stroke. Pt was recommended to call 911 due to symptoms including hypertension, headache and eye pain that began today. Pt alert and answering questions appropriately at this time.

## 2022-07-12 NOTE — ED Provider Notes (Signed)
[facility] Provider Note    Event Date/Time   First MD Initiated Contact with Patient 07/12/22 1740     (approximate)   History   Hypertension and Headache   HPI  Susan [name] is a 62 y.o. female   Past medical history of thalamic stroke last week received TNK with residual right-sided deficits numbness and weakness, diabetes, hypertension and hyperlipidemia, who presents to the emergency department with high blood pressure.  She has been recovering well from her stroke with some improving but residual right-sided deficits and no new deficits.  Her home health agency recorded blood pressures in the 123456 systolic before she had taken her blood pressure medicines and repeated the blood pressure several times after taking blood pressure medicines and it came down to [id number] systolic.  She has a mild headache starting this morning.  No trauma.  No new deficits noted by patient.  She has no other acute medical complaints including respiratory symptoms, infectious symptoms, chest pain, lightheadedness or vision changes.  Her blood pressure medicines.  She states that her blood pressure tends to run higher than normal over the last 1 year.   External Medical Documents Reviewed: Telephone encounter from earlier today documenting blood pressure as well as discharge summary 07/07/2022 for ischemic stroke got [facility] K      Physical Exam   Triage Vital Signs: ED Triage Vitals  Enc Vitals Group     BP 07/12/22 1743 (!) 189/86     Pulse Rate 07/12/22 1743 63     Resp 07/12/22 1743 18     Temp 07/12/22 1743 97.8 F (36.6 C)     Temp Source 07/12/22 1743 Oral     SpO2 07/12/22 1743 96 %     Weight 07/12/22 1744 140 lb 14 oz (63.9 kg)     Height 07/12/22 1744 '5\' 2"'$  (1.575 m)     Head Circumference --      Peak Flow --      Pain Score 07/12/22 1744 6     Pain Loc --      Pain Edu? --      Excl. in [place]? --     Most recent vital signs: Vitals:   07/12/22 1830  07/12/22 1900  BP: (!) 177/81 (!) 174/92  Pulse: 66 66  Resp: 19 (!) 21  Temp:    SpO2: 97% 94%    General: Awake, no distress.  CV:  Good peripheral perfusion.  Resp:  Normal effort.  Abd:  No distention.  Other:  comfortable appearing with hypertension initially 180s over 80s and then now 160s on recheck.  Neurologic exam consistent with prior stroke residual deficits including very mild decreased grip strength on the right as well as mildly decreased strength of the right compared to left lower extremity.  Some residual numbness to the torso.  No facial asymmetry or dysarthria.  Clear lungs with abdomen soft nontender   ED Results / Procedures / Treatments   Labs (all labs ordered are listed, but only abnormal results are displayed) Labs Reviewed  BASIC METABOLIC PANEL - Abnormal; Notable for the following components:      Result Value   Sodium 134 (*)    Glucose, Bld 113 (*)    All other components within normal limits  CBC WITH DIFFERENTIAL/PLATELET  TROPONIN I (HIGH SENSITIVITY)     I ordered and reviewed the above labs they are notable for overall electrolytes unremarkable cell counts and negative troponin  EKG  ED ECG REPORT I, Lucillie Garfinkel, the attending physician, personally viewed and interpreted this ECG.   Date: 07/12/2022  EKG Time: 1743  Rate: 66  Rhythm: sinus  Axis: nl  Intervals:none; QTc is long at 520  ST&T Change: No acute ischemic changes    RADIOLOGY I independently reviewed and interpreted CT of the head see no obvious bleeding or midline shift   PROCEDURES:  Critical Care performed: No  Procedures   MEDICATIONS ORDERED IN ED: Medications  acetaminophen (TYLENOL) tablet 1,000 mg (1,000 mg Oral Given 07/12/22 1821)  prochlorperazine (COMPAZINE) injection 10 mg (10 mg Intravenous Given 07/12/22 1820)  diphenhydrAMINE (BENADRYL) injection 12.5 mg (12.5 mg Intravenous Given 07/12/22 1819)    IMPRESSION / MDM / ASSESSMENT AND PLAN / ED  COURSE  I reviewed the triage vital signs and the nursing notes.                                Patient's presentation is most consistent with acute presentation with potential threat to life or bodily function.  Differential diagnosis includes, but is not limited to, intracranial bleeding, migraine, hypertensive urgency/crisis, considered but less likely ACS or dissection   The patient is on the cardiac monitor to evaluate for evidence of arrhythmia and/or significant heart rate changes.  MDM: This is a patient who recently received TNK for ischemic stroke with resolving symptoms no new neurologic symptoms who presents with high blood pressure.  History of blood pressure elevations on antihypertensives.  She has a mild headache today atraumatic.  Will treat headache with migraine cocktail and obtain a CT scan to rule out head bleed.  Check basic labs including troponin EKG though I doubt ACS given no chest pain.  I considered hospitalization for admission or observation but since the headache is resolved blood pressure fluctuating between 123456 to [id number] systolic no other symptoms and evaluation as above is negative, patient will follow-up outpatient with PMD for blood pressure management tomorrow.        FINAL CLINICAL IMPRESSION(S) / ED DIAGNOSES   Final diagnoses:  Uncontrolled hypertension  Nonintractable headache, unspecified chronicity pattern, unspecified headache type     Rx / DC Orders   ED Discharge Orders     None        Note:  This document was prepared using Dragon voice recognition software and may include unintentional dictation errors.    Lucillie Garfinkel, MD 07/12/22 [phone]

## 2022-07-12 NOTE — Discharge Instructions (Addendum)
Call your primary doctor in the morning to discuss blood pressure medications and ongoing monitoring at home and increasing your dose or adjusting your medications as needed for high blood pressure.  Take acetaminophen 650 mg every 6 hours for headache.  Take with food. Thank you for choosing [name] for your health care today!  Please see your primary doctor this week for a follow up appointment.   Sometimes, in the early stages of certain disease courses it is difficult to detect in the emergency department evaluation -- so, it is important that you continue to monitor your symptoms and call your doctor right away or return to the emergency department if you develop any new or worsening symptoms.  Please go to the following website to schedule new (and existing) patient appointments:   [email or web address]  If you do not have a primary doctor try calling the following clinics to establish care:  If you have insurance:  [facility] [id number] [place] [address]   [facility]  [phone] [place] [place] [address]   If you do not have insurance:  [facility]  [id number] [address], [place] [address]   The following is another list of primary care offices in the area who are accepting new patients at this time.  Please reach out to one of them directly and let them know you would like to schedule an appointment to follow up on an Emergency Department visit, and/or to establish a new primary care provider (PCP).  There are likely other primary care clinics in the are who are accepting new patients, but this is an excellent place to start:  [place] physician: Dr Lavon Paganini [address] [place], [place] [address] [phone]  [facility] Lead Physician: Dr Steele Sizer [address], [place], [place]  [address] [phone]  [place] Physician: Dr Park Liter [address] [place], [place] [address] [phone]  [place] Lead Physician: Dr Dewaine Oats [place], [place], [place] [address] [phone]  [place] [facility] [place] Physician: Dr Halina Maidens [address] Colin Broach [place], [place] [address] [phone]   It was my pleasure to care for you today.   [place] Jacelyn Grip, MD

## 2022-07-12 NOTE — Telephone Encounter (Signed)
Neal from [facility] called stating pt BP readings about an hour ago were 200/100 and pt had just taken her medication not even five minutes ago. Nori Riis took pt BP several times and it did not come down. Nori Riis stated pt just called him and she stated it was 173/113

## 2022-07-13 ENCOUNTER — Ambulatory Visit: Payer: Self-pay

## 2022-07-13 DIAGNOSIS — I700 Atherosclerosis of aorta: Secondary | ICD-10-CM | POA: Diagnosis not present

## 2022-07-13 DIAGNOSIS — Z7902 Long term (current) use of antithrombotics/antiplatelets: Secondary | ICD-10-CM | POA: Diagnosis not present

## 2022-07-13 DIAGNOSIS — J45909 Unspecified asthma, uncomplicated: Secondary | ICD-10-CM | POA: Diagnosis not present

## 2022-07-13 DIAGNOSIS — E538 Deficiency of other specified B group vitamins: Secondary | ICD-10-CM | POA: Diagnosis not present

## 2022-07-13 DIAGNOSIS — Z7985 Long-term (current) use of injectable non-insulin antidiabetic drugs: Secondary | ICD-10-CM | POA: Diagnosis not present

## 2022-07-13 DIAGNOSIS — Z7982 Long term (current) use of aspirin: Secondary | ICD-10-CM | POA: Diagnosis not present

## 2022-07-13 DIAGNOSIS — D649 Anemia, unspecified: Secondary | ICD-10-CM | POA: Diagnosis not present

## 2022-07-13 DIAGNOSIS — F17210 Nicotine dependence, cigarettes, uncomplicated: Secondary | ICD-10-CM | POA: Diagnosis not present

## 2022-07-13 DIAGNOSIS — E042 Nontoxic multinodular goiter: Secondary | ICD-10-CM | POA: Diagnosis not present

## 2022-07-13 DIAGNOSIS — Z9981 Dependence on supplemental oxygen: Secondary | ICD-10-CM | POA: Diagnosis not present

## 2022-07-13 DIAGNOSIS — M810 Age-related osteoporosis without current pathological fracture: Secondary | ICD-10-CM | POA: Diagnosis not present

## 2022-07-13 DIAGNOSIS — M5137 Other intervertebral disc degeneration, lumbosacral region: Secondary | ICD-10-CM | POA: Diagnosis not present

## 2022-07-13 DIAGNOSIS — I10 Essential (primary) hypertension: Secondary | ICD-10-CM | POA: Diagnosis not present

## 2022-07-13 DIAGNOSIS — I69322 Dysarthria following cerebral infarction: Secondary | ICD-10-CM | POA: Diagnosis not present

## 2022-07-13 DIAGNOSIS — E559 Vitamin D deficiency, unspecified: Secondary | ICD-10-CM | POA: Diagnosis not present

## 2022-07-13 DIAGNOSIS — E785 Hyperlipidemia, unspecified: Secondary | ICD-10-CM | POA: Diagnosis not present

## 2022-07-13 DIAGNOSIS — G4733 Obstructive sleep apnea (adult) (pediatric): Secondary | ICD-10-CM | POA: Diagnosis not present

## 2022-07-13 DIAGNOSIS — I69351 Hemiplegia and hemiparesis following cerebral infarction affecting right dominant side: Secondary | ICD-10-CM | POA: Diagnosis not present

## 2022-07-13 DIAGNOSIS — E119 Type 2 diabetes mellitus without complications: Secondary | ICD-10-CM | POA: Diagnosis not present

## 2022-07-13 DIAGNOSIS — E039 Hypothyroidism, unspecified: Secondary | ICD-10-CM | POA: Diagnosis not present

## 2022-07-13 DIAGNOSIS — K219 Gastro-esophageal reflux disease without esophagitis: Secondary | ICD-10-CM | POA: Diagnosis not present

## 2022-07-13 DIAGNOSIS — K5790 Diverticulosis of intestine, part unspecified, without perforation or abscess without bleeding: Secondary | ICD-10-CM | POA: Diagnosis not present

## 2022-07-13 MED ORDER — SPIRONOLACTONE 25 MG PO TABS: 12.5000 mg | ORAL_TABLET | Freq: Every day | ORAL | 3 refills | Status: DC

## 2022-07-13 NOTE — Telephone Encounter (Signed)
Please call the patient and see what her BP is today. Is she having any chest pain, shortness of breath, numbness, weakness, vision changes? Other symptoms?

## 2022-07-13 NOTE — Telephone Encounter (Signed)
Noted.  Blood pressure is above goal.  I would like to add spironolactone 12.5 mg daily to help bring her blood pressure down further.  She will need labs at her follow-up visit with me next week.  I have sent this to her pharmacy.

## 2022-07-13 NOTE — Addendum Note (Signed)
Addended by: Caryl Bis, Kritika Stukes G on: 07/13/2022 12:06 PM   Modules accepted: Orders

## 2022-07-13 NOTE — Telephone Encounter (Signed)
Patient called office back. She just got up and will take her BP and call office back. She has no chest pain, no shortness of breath, no vision changes. She does complain of still having some weakness from her stroke last week.

## 2022-07-13 NOTE — Telephone Encounter (Signed)
I called and spoke with the patient and informed her that the provider wants to start her on a new medication for her BP spironolactone and she understood,  I also informed her that he would do labs at her visit with the provider next week and she understood.  Brayan Votaw,cma

## 2022-07-13 NOTE — Telephone Encounter (Signed)
Patient called back at 9:35 am, BP is 172/94.

## 2022-07-13 NOTE — Chronic Care Management (AMB) (Signed)
   07/13/2022  Susan [name] 02-08-1961 [id number]  Reason for Encounter: Change in CCM enrollment status   Horris Latino RN Care Manager/Chronic Care Management [phone]

## 2022-07-13 NOTE — Telephone Encounter (Signed)
Left message to call back regarding Dr. Ellen Henri message

## 2022-07-13 NOTE — Telephone Encounter (Signed)
Spoke to Susan [name] to let her know that Dr. Caryl Bis states the 3/15 appointment is ok for now. We also let the Susan [name] knows if she has any symptoms of SOB, chest pain, vision changes, headache, high BP/ low BP to let us know ASAP. Susan [name] gets labs in the morning and understands and is agreeable. BP at 9:35 this AM 172/94.

## 2022-07-14 ENCOUNTER — Other Ambulatory Visit (INDEPENDENT_AMBULATORY_CARE_PROVIDER_SITE_OTHER): Payer: 59

## 2022-07-14 DIAGNOSIS — E559 Vitamin D deficiency, unspecified: Secondary | ICD-10-CM

## 2022-07-14 LAB — VITAMIN D 25 HYDROXY (VIT D DEFICIENCY, FRACTURES): VITD: 12.22 ng/mL — ABNORMAL LOW (ref 30.00–100.00)

## 2022-07-15 ENCOUNTER — Telehealth: Payer: Self-pay

## 2022-07-15 NOTE — Telephone Encounter (Signed)
LVMTOCB

## 2022-07-15 NOTE — Telephone Encounter (Signed)
Patient states she had a stroke last week and her blood pressure is still high.  Patient states at 8:30am - 174/107.  Patient states she took a whole pill of her spironolactone (ALDACTONE) 25 MG tablet.  Patient states her physical therapist came by today and wouldn't do her exercises because her blood pressure was 170/100.  Patient states she would like to know if Dr. Tommi Rumps would like for her to do anything differently before her appointment with him on 07/22/2022.  Patient states she is not having any other symptoms.  Patient states she is still slightly numb on her right side from having the stroke.  I transferred call to Access Nurse.

## 2022-07-15 NOTE — Telephone Encounter (Signed)
Access Nurse called back to state patient is having pressure behind her eyes in addition to the elevated blood pressure.  Access Nurse states she recommends that patient go to the ED, but patient refuses.  Access Nurse states patient is expecting a call back from our office.

## 2022-07-18 ENCOUNTER — Telehealth: Payer: Self-pay

## 2022-07-18 NOTE — Telephone Encounter (Signed)
Pt returned [place] call. Note below was read to pt. Pt aware.

## 2022-07-18 NOTE — Telephone Encounter (Signed)
Pt returned call. Note below was read to her. Pt aware. 

## 2022-07-18 NOTE — Telephone Encounter (Signed)
Left message to call the office back regarding Dr. Ellen Henri recommnedations.

## 2022-07-18 NOTE — Telephone Encounter (Signed)
Called Patient to check on her this morning. BP is 180/101 but she just took her medication 5 minutes prior to checking her BP. Patient states no symptoms today and "she feels fine." Patient states yesterday her BP was 141/84 but that checking her BP multiple times a day is causing her Anxiety so she did not check it in Saturday. Patient states she does still have a little bit of numbness from the stroke. Patient is scheduled for a office visit on 3/15 but would like to know if there is anything she needs to change or do before then?

## 2022-07-18 NOTE — Telephone Encounter (Signed)
She should continue with her current blood pressure medication regimen.  She should check her blood pressure once daily 2 hours after taking her blood pressure medicines.  If she develops chest pain, shortness of breath, new numbness, new weakness, vision changes, or any other new symptoms she needs to seek medical attention.  She should keep her appointment with me later this week.

## 2022-07-18 NOTE — Telephone Encounter (Signed)
Lm for pt to cb re ::  Susan Bis Angela Adam, MD  P [place] [facility] Vitamin D remains low.  She needs to continue on her prescription vitamin D supplement and have this rechecked in 8 weeks.

## 2022-07-19 ENCOUNTER — Other Ambulatory Visit: Payer: Self-pay

## 2022-07-19 DIAGNOSIS — F17210 Nicotine dependence, cigarettes, uncomplicated: Secondary | ICD-10-CM | POA: Diagnosis not present

## 2022-07-19 DIAGNOSIS — D649 Anemia, unspecified: Secondary | ICD-10-CM | POA: Diagnosis not present

## 2022-07-19 DIAGNOSIS — M810 Age-related osteoporosis without current pathological fracture: Secondary | ICD-10-CM | POA: Diagnosis not present

## 2022-07-19 DIAGNOSIS — I10 Essential (primary) hypertension: Secondary | ICD-10-CM | POA: Diagnosis not present

## 2022-07-19 DIAGNOSIS — Z9981 Dependence on supplemental oxygen: Secondary | ICD-10-CM | POA: Diagnosis not present

## 2022-07-19 DIAGNOSIS — E785 Hyperlipidemia, unspecified: Secondary | ICD-10-CM | POA: Diagnosis not present

## 2022-07-19 DIAGNOSIS — E559 Vitamin D deficiency, unspecified: Secondary | ICD-10-CM

## 2022-07-19 DIAGNOSIS — M5137 Other intervertebral disc degeneration, lumbosacral region: Secondary | ICD-10-CM | POA: Diagnosis not present

## 2022-07-19 DIAGNOSIS — E119 Type 2 diabetes mellitus without complications: Secondary | ICD-10-CM | POA: Diagnosis not present

## 2022-07-19 DIAGNOSIS — I69351 Hemiplegia and hemiparesis following cerebral infarction affecting right dominant side: Secondary | ICD-10-CM | POA: Diagnosis not present

## 2022-07-19 DIAGNOSIS — G4733 Obstructive sleep apnea (adult) (pediatric): Secondary | ICD-10-CM | POA: Diagnosis not present

## 2022-07-19 DIAGNOSIS — Z7902 Long term (current) use of antithrombotics/antiplatelets: Secondary | ICD-10-CM | POA: Diagnosis not present

## 2022-07-19 DIAGNOSIS — E042 Nontoxic multinodular goiter: Secondary | ICD-10-CM | POA: Diagnosis not present

## 2022-07-19 DIAGNOSIS — E538 Deficiency of other specified B group vitamins: Secondary | ICD-10-CM | POA: Diagnosis not present

## 2022-07-19 DIAGNOSIS — Z7985 Long-term (current) use of injectable non-insulin antidiabetic drugs: Secondary | ICD-10-CM | POA: Diagnosis not present

## 2022-07-19 DIAGNOSIS — K219 Gastro-esophageal reflux disease without esophagitis: Secondary | ICD-10-CM | POA: Diagnosis not present

## 2022-07-19 DIAGNOSIS — K5790 Diverticulosis of intestine, part unspecified, without perforation or abscess without bleeding: Secondary | ICD-10-CM | POA: Diagnosis not present

## 2022-07-19 DIAGNOSIS — E039 Hypothyroidism, unspecified: Secondary | ICD-10-CM | POA: Diagnosis not present

## 2022-07-19 DIAGNOSIS — Z7982 Long term (current) use of aspirin: Secondary | ICD-10-CM | POA: Diagnosis not present

## 2022-07-19 DIAGNOSIS — I700 Atherosclerosis of aorta: Secondary | ICD-10-CM | POA: Diagnosis not present

## 2022-07-19 DIAGNOSIS — J45909 Unspecified asthma, uncomplicated: Secondary | ICD-10-CM | POA: Diagnosis not present

## 2022-07-19 DIAGNOSIS — I69322 Dysarthria following cerebral infarction: Secondary | ICD-10-CM | POA: Diagnosis not present

## 2022-07-20 ENCOUNTER — Ambulatory Visit: Payer: 59 | Admitting: Family Medicine

## 2022-07-21 ENCOUNTER — Other Ambulatory Visit: Payer: Self-pay | Admitting: Psychiatry

## 2022-07-21 DIAGNOSIS — Z7902 Long term (current) use of antithrombotics/antiplatelets: Secondary | ICD-10-CM | POA: Diagnosis not present

## 2022-07-21 DIAGNOSIS — K5790 Diverticulosis of intestine, part unspecified, without perforation or abscess without bleeding: Secondary | ICD-10-CM | POA: Diagnosis not present

## 2022-07-21 DIAGNOSIS — E042 Nontoxic multinodular goiter: Secondary | ICD-10-CM | POA: Diagnosis not present

## 2022-07-21 DIAGNOSIS — Z8614 Personal history of Methicillin resistant Staphylococcus aureus infection: Secondary | ICD-10-CM

## 2022-07-21 DIAGNOSIS — Z7985 Long-term (current) use of injectable non-insulin antidiabetic drugs: Secondary | ICD-10-CM | POA: Diagnosis not present

## 2022-07-21 DIAGNOSIS — Z8616 Personal history of COVID-19: Secondary | ICD-10-CM

## 2022-07-21 DIAGNOSIS — M810 Age-related osteoporosis without current pathological fracture: Secondary | ICD-10-CM | POA: Diagnosis not present

## 2022-07-21 DIAGNOSIS — F988 Other specified behavioral and emotional disorders with onset usually occurring in childhood and adolescence: Secondary | ICD-10-CM

## 2022-07-21 DIAGNOSIS — F17210 Nicotine dependence, cigarettes, uncomplicated: Secondary | ICD-10-CM | POA: Diagnosis not present

## 2022-07-21 DIAGNOSIS — Z96642 Presence of left artificial hip joint: Secondary | ICD-10-CM

## 2022-07-21 DIAGNOSIS — I10 Essential (primary) hypertension: Secondary | ICD-10-CM | POA: Diagnosis not present

## 2022-07-21 DIAGNOSIS — G4733 Obstructive sleep apnea (adult) (pediatric): Secondary | ICD-10-CM | POA: Diagnosis not present

## 2022-07-21 DIAGNOSIS — I69322 Dysarthria following cerebral infarction: Secondary | ICD-10-CM | POA: Diagnosis not present

## 2022-07-21 DIAGNOSIS — E559 Vitamin D deficiency, unspecified: Secondary | ICD-10-CM | POA: Diagnosis not present

## 2022-07-21 DIAGNOSIS — J45909 Unspecified asthma, uncomplicated: Secondary | ICD-10-CM | POA: Diagnosis not present

## 2022-07-21 DIAGNOSIS — E119 Type 2 diabetes mellitus without complications: Secondary | ICD-10-CM | POA: Diagnosis not present

## 2022-07-21 DIAGNOSIS — F419 Anxiety disorder, unspecified: Secondary | ICD-10-CM

## 2022-07-21 DIAGNOSIS — E785 Hyperlipidemia, unspecified: Secondary | ICD-10-CM | POA: Diagnosis not present

## 2022-07-21 DIAGNOSIS — E538 Deficiency of other specified B group vitamins: Secondary | ICD-10-CM | POA: Diagnosis not present

## 2022-07-21 DIAGNOSIS — K219 Gastro-esophageal reflux disease without esophagitis: Secondary | ICD-10-CM | POA: Diagnosis not present

## 2022-07-21 DIAGNOSIS — D649 Anemia, unspecified: Secondary | ICD-10-CM | POA: Diagnosis not present

## 2022-07-21 DIAGNOSIS — Z9181 History of falling: Secondary | ICD-10-CM

## 2022-07-21 DIAGNOSIS — Z7982 Long term (current) use of aspirin: Secondary | ICD-10-CM | POA: Diagnosis not present

## 2022-07-21 DIAGNOSIS — F319 Bipolar disorder, unspecified: Secondary | ICD-10-CM

## 2022-07-21 DIAGNOSIS — I69351 Hemiplegia and hemiparesis following cerebral infarction affecting right dominant side: Secondary | ICD-10-CM | POA: Diagnosis not present

## 2022-07-21 DIAGNOSIS — M5137 Other intervertebral disc degeneration, lumbosacral region: Secondary | ICD-10-CM | POA: Diagnosis not present

## 2022-07-21 DIAGNOSIS — F4310 Post-traumatic stress disorder, unspecified: Secondary | ICD-10-CM

## 2022-07-21 DIAGNOSIS — Z9981 Dependence on supplemental oxygen: Secondary | ICD-10-CM | POA: Diagnosis not present

## 2022-07-21 DIAGNOSIS — I700 Atherosclerosis of aorta: Secondary | ICD-10-CM | POA: Diagnosis not present

## 2022-07-21 DIAGNOSIS — Z96653 Presence of artificial knee joint, bilateral: Secondary | ICD-10-CM

## 2022-07-21 DIAGNOSIS — E039 Hypothyroidism, unspecified: Secondary | ICD-10-CM | POA: Diagnosis not present

## 2022-07-22 ENCOUNTER — Ambulatory Visit (INDEPENDENT_AMBULATORY_CARE_PROVIDER_SITE_OTHER): Payer: 59 | Admitting: Family Medicine

## 2022-07-22 ENCOUNTER — Encounter: Payer: Self-pay | Admitting: Family Medicine

## 2022-07-22 ENCOUNTER — Other Ambulatory Visit: Payer: Self-pay | Admitting: Family Medicine

## 2022-07-22 VITALS — BP 124/86 | HR 69 | Temp 97.4°F | Ht 62.0 in | Wt 217.8 lb

## 2022-07-22 DIAGNOSIS — Z1231 Encounter for screening mammogram for malignant neoplasm of breast: Secondary | ICD-10-CM

## 2022-07-22 DIAGNOSIS — E785 Hyperlipidemia, unspecified: Secondary | ICD-10-CM | POA: Diagnosis not present

## 2022-07-22 DIAGNOSIS — R197 Diarrhea, unspecified: Secondary | ICD-10-CM

## 2022-07-22 DIAGNOSIS — I10 Essential (primary) hypertension: Secondary | ICD-10-CM | POA: Diagnosis not present

## 2022-07-22 DIAGNOSIS — K529 Noninfective gastroenteritis and colitis, unspecified: Secondary | ICD-10-CM

## 2022-07-22 DIAGNOSIS — R1013 Epigastric pain: Secondary | ICD-10-CM | POA: Diagnosis not present

## 2022-07-22 DIAGNOSIS — E049 Nontoxic goiter, unspecified: Secondary | ICD-10-CM | POA: Insufficient documentation

## 2022-07-22 DIAGNOSIS — K219 Gastro-esophageal reflux disease without esophagitis: Secondary | ICD-10-CM

## 2022-07-22 DIAGNOSIS — R1310 Dysphagia, unspecified: Secondary | ICD-10-CM

## 2022-07-22 DIAGNOSIS — I639 Cerebral infarction, unspecified: Secondary | ICD-10-CM | POA: Diagnosis not present

## 2022-07-22 DIAGNOSIS — F3289 Other specified depressive episodes: Secondary | ICD-10-CM

## 2022-07-22 HISTORY — DX: Noninfective gastroenteritis and colitis, unspecified: K52.9

## 2022-07-22 LAB — COMPREHENSIVE METABOLIC PANEL
ALT: 11 U/L (ref 0–35)
AST: 13 U/L (ref 0–37)
Albumin: 4.1 g/dL (ref 3.5–5.2)
Alkaline Phosphatase: 94 U/L (ref 39–117)
BUN: 14 mg/dL (ref 6–23)
CO2: 24 mEq/L (ref 19–32)
Calcium: 9.3 mg/dL (ref 8.4–10.5)
Chloride: 103 mEq/L (ref 96–112)
Creatinine, Ser: 1.11 mg/dL (ref 0.40–1.20)
GFR: 53.66 mL/min — ABNORMAL LOW (ref >60.00)
Glucose, Bld: 93 mg/dL (ref 70–99)
Potassium: 3.8 mEq/L (ref 3.5–5.1)
Sodium: 135 mEq/L (ref 135–145)
Total Bilirubin: 0.4 mg/dL (ref 0.2–1.2)
Total Protein: 7 g/dL (ref 6.0–8.3)

## 2022-07-22 LAB — LIPASE: Lipase: 6 U/L — ABNORMAL LOW (ref 11.0–59.0)

## 2022-07-22 MED ORDER — PANTOPRAZOLE SODIUM 40 MG PO TBEC: 40.0000 mg | DELAYED_RELEASE_TABLET | Freq: Every day | ORAL | 0 refills | Status: DC

## 2022-07-22 NOTE — Assessment & Plan Note (Signed)
Chronic issue.  Continue Crestor 20 mg daily. 

## 2022-07-22 NOTE — Addendum Note (Signed)
Addended by: Leeanne Rio on: 07/22/2022 12:25 PM   Modules accepted: Orders

## 2022-07-22 NOTE — Patient Instructions (Signed)
Nice to see you. We will start you protonix for your reflux. GI should call you to set up a visit for this.  Someone will call you to schedule your thyroid ultrasound.  If you do not hear about that in the next 2 weeks please let us know. Please call to schedule your mammogram. Will contact you with your lab results. If you have new stroke symptoms please go to the emergency room.

## 2022-07-22 NOTE — Progress Notes (Signed)
Tommi Rumps, MD Phone: [phone]  Susan [name] is a 62 y.o. female who presents today for follow-up.  HYPERTENSION Disease Monitoring Home BP Monitoring 140s-160s/90s-100s. Chest pain- no    Dyspnea- no Medications Compliance-  taking coreg, spironolactone, telmisartan.  Edema- no Has had some headache.  She was evaluated in the emergency room recently for this and her elevated blood pressure.  Her head feels full.  She had a negative CT head in the ED. BMET    Component Value Date/Time   NA 134 (L) 07/12/2022 1748   NA 137 05/28/2020 0903   NA 136 04/29/2014 1133   K 3.5 07/12/2022 1748   K 4.2 04/29/2014 1133   CL 101 07/12/2022 1748   CL 103 04/29/2014 1133   CO2 24 07/12/2022 1748   CO2 30 04/29/2014 1133   GLUCOSE 113 (H) 07/12/2022 1748   GLUCOSE 85 04/29/2014 1133   BUN 11 07/12/2022 1748   BUN 9 05/28/2020 0903   BUN 9 04/29/2014 1133   CREATININE 0.87 07/12/2022 1748   CREATININE 0.92 10/16/2019 1139   CALCIUM 8.9 07/12/2022 1748   CALCIUM 9.1 04/29/2014 1133   GFRNONAA >60 07/12/2022 1748   GFRNONAA 69 10/16/2019 1139   GFRAA 64 05/28/2020 0903   GFRAA 80 10/16/2019 1139   Stroke: Patient had a thalamic stroke.  She received TNK.  She notes since discharge she has had some improvement in her right-sided numbness and weakness though continues to have some of both of those in the right side.  She has been taking Plavix and Crestor.  She has an appointment with neurology in April.  She has been doing physical therapy.  Anxiety/depression: Patient notes some anxiety and depression since getting home after her stroke.  She wonders if this could be related to the stroke.  She notes no SI or HI.  She is already on medication.  She follows with psychiatry.  Diarrhea: Patient notes on Tuesday through yesterday morning she had episodes of diarrhea that were fairly significant.  She notes no diarrhea since yesterday morning.  No blood in her stool.  No  fevers.  Dysphagia/reflux: Patient notes her stomach feels sour.  She notes reflux into her esophagus with some burning and sour taste.  She does note having some food stick on the way down.  Enlarged thyroid gland: Noted on imaging.  Ultrasound was recommended by radiology.  Social History   Tobacco Use  Smoking Status Every Day   Packs/day: 0.50   Years: 41.00   Additional pack years: 0.00   Total pack years: 20.50   Types: Cigarettes  Smokeless Tobacco Never    Current Outpatient Medications on File Prior to Visit  Medication Sig Dispense Refill   acetaminophen (TYLENOL) 325 MG tablet Take 650 mg by mouth every 6 (six) hours as needed.     albuterol (VENTOLIN HFA) 108 (90 Base) MCG/ACT inhaler Inhale 2 puffs into the lungs every 6 (six) hours as needed for wheezing or shortness of breath. 8 g 2   aspirin EC 81 MG tablet Take 1 tablet (81 mg total) by mouth daily. Swallow whole. 30 tablet 12   carvedilol (COREG) 25 MG tablet TAKE 1 TABLET BY MOUTH TWICE DAILY WITH MEAL. MONITOR HEART RATE 60 tablet 3   clopidogrel (PLAVIX) 75 MG tablet Take 1 tablet (75 mg total) by mouth daily for 21 days. 21 tablet 0   clotrimazole (LOTRIMIN) 1 % cream Apply 1 application topically 2 (two) times daily as needed.  cyclobenzaprine (FLEXERIL) 5 MG tablet Take 5 mg by mouth 2 (two) times daily as needed for muscle spasms.      DULoxetine (CYMBALTA) 20 MG capsule TAKE 1 CAPSULE BY MOUTH EVERY DAY WITH 30MG  CAPSULE 30 capsule 1   DULoxetine (CYMBALTA) 30 MG capsule TAKE 1 CAPSULE BY MOUTH DAILY 30 capsule 1   gabapentin (NEURONTIN) 300 MG capsule Take 600 mg by mouth 2 (two) times daily.      glucose blood (ACCU-CHEK GUIDE) test strip TEST UP TO FOUR TIMES A DAY 100 each 0   HYDROcodone-acetaminophen (NORCO/VICODIN) 5-325 MG tablet Take 1 tablet by mouth 3 (three) times daily as needed for moderate pain.     naloxone (NARCAN) nasal spray 4 mg/0.1 mL Place into the nose.     rosuvastatin (CRESTOR) 20  MG tablet Take 1 tablet (20 mg total) by mouth daily. 30 tablet 3   Semaglutide,0.25 or 0.5MG /DOS, 2 MG/3ML SOPN Inject 0.25 mg into the skin once a week. 3 mL 1   spironolactone (ALDACTONE) 25 MG tablet Take 0.5 tablets (12.5 mg total) by mouth daily. 45 tablet 3   telmisartan (MICARDIS) 80 MG tablet TAKE 1 TABLET(80 MG) BY MOUTH DAILY 90 tablet 1   Vitamin D, Ergocalciferol, (DRISDOL) 1.25 MG (50000 UNIT) CAPS capsule Take 1 capsule (50,000 Units total) by mouth every 7 (seven) days. 12 capsule 0   No current facility-administered medications on file prior to visit.     ROS see history of present illness  Objective  Physical Exam Vitals:   07/22/22 1123 07/22/22 1133  BP: 138/82 124/86  Pulse: 69   Temp: (!) 97.4 F (36.3 C)   SpO2: 99%     BP Readings from Last 3 Encounters:  07/22/22 124/86  07/12/22 (!) 196/94  07/07/22 137/72   Wt Readings from Last 3 Encounters:  07/22/22 217 lb 12.8 oz (98.8 kg)  07/12/22 140 lb 14 oz (63.9 kg)  07/05/22 140 lb 14 oz (63.9 kg)    Physical Exam Constitutional:      General: She is not in acute distress.    Appearance: She is not diaphoretic.  Cardiovascular:     Rate and Rhythm: Normal rate and regular rhythm.     Heart sounds: Normal heart sounds.  Pulmonary:     Effort: Pulmonary effort is normal.     Breath sounds: Normal breath sounds.  Abdominal:     General: Bowel sounds are normal. There is no distension.     Palpations: Abdomen is soft.     Tenderness: There is abdominal tenderness (Mild tenderness in the epigastric region).  Musculoskeletal:     Right lower leg: No edema.     Left lower leg: No edema.  Skin:    General: Skin is warm and dry.  Neurological:     Mental Status: She is alert.     Comments: Sensation to light touch reduced in V1 through V3 on the right side, otherwise CN 3-12 intact, 4+/5 strength right bicep and tricep and dorsiflexion, 5/5 strength in left biceps, triceps, grip, dorsiflexion,  bilateral quads, hamstrings, plantarflexion, sensation to light touch reduced in right UE and LE, intact light touch sensation in left upper and lower extremities normal gait      Assessment/Plan: Please see individual problem list.  Primary hypertension Assessment & Plan: Chronic issue.  Above goal.  We are checking a BMP today and as long as her kidney function and potassium are acceptable we will increase her spironolactone to  25 mg daily.  She will continue carvedilol 25 mg twice daily and telmisartan 80 mg daily.  Orders: -     Comprehensive metabolic panel  Dysphagia, unspecified type -     Ambulatory referral to Gastroenterology -     Pantoprazole Sodium; Take 1 tablet (40 mg total) by mouth daily.  Dispense: 30 tablet; Refill: 0  Epigastric pain Assessment & Plan: Possibly related to gastritis though she did have a little bit of tenderness on exam and thus we are checking a lipase and CMP.  Orders: -     Comprehensive metabolic panel -     Lipase -     CBC  Encounter for screening mammogram for malignant neoplasm of breast -     3D Screening Mammogram, Left and Right; Future  Gastroesophageal reflux disease, unspecified whether esophagitis present Assessment & Plan: Patient complains of reflux symptoms and some dysphagia.  Will start her on Protonix 40 mg daily to be taken 30 to 60 minutes before breakfast.  She will take this for 30 days.  I will refer to GI as well.  Orders: -     Pantoprazole Sodium; Take 1 tablet (40 mg total) by mouth daily.  Dispense: 30 tablet; Refill: 0  Enlarged thyroid Assessment & Plan: Ultrasound ordered to evaluate this finding on CT imaging.  Orders: -     US THYROID; Future  Cerebrovascular accident (CVA), unspecified mechanism ([place]) Assessment & Plan: Still has some residual numbness and weakness on the right side.  She will see neurology as planned.  She will continue Plavix 75 mg daily and aspirin 81 mg daily.  If she has any  recurrent or new stroke symptoms she will seek medical attention immediately.   Hyperlipidemia, unspecified hyperlipidemia type Assessment & Plan: Chronic issue.  Continue Crestor 20 mg daily.   Diarrhea of presumed infectious origin Assessment & Plan: Possibly related to viral GI illness.  Symptoms have resolved at this time.  If they recur she will let us know and we can test her for C. difficile.   Other depression Assessment & Plan: Patient with some anxiety and depression after having her stroke.  I discussed that this can commonly occur in patients who have had strokes.  She would like to monitor on her current medications.  She will contact us or her psychiatrist if her symptoms worsen at all.      Health Maintenance: Patient will call to schedule her mammogram.  We discussed colonoscopy though she deferred this at this time as she wants to recover more.  Return in about 3 months (around 10/22/2022).   Tommi Rumps, MD [place]

## 2022-07-22 NOTE — Assessment & Plan Note (Signed)
Patient with some anxiety and depression after having her stroke.  I discussed that this can commonly occur in patients who have had strokes.  She would like to monitor on her current medications.  She will contact us or her psychiatrist if her symptoms worsen at all.

## 2022-07-22 NOTE — Assessment & Plan Note (Signed)
Possibly related to gastritis though she did have a little bit of tenderness on exam and thus we are checking a lipase and CMP.

## 2022-07-22 NOTE — Assessment & Plan Note (Signed)
Ultrasound ordered to evaluate this finding on CT imaging.

## 2022-07-22 NOTE — Assessment & Plan Note (Signed)
Possibly related to viral GI illness.  Symptoms have resolved at this time.  If they recur she will let us know and we can test her for C. difficile.

## 2022-07-22 NOTE — Assessment & Plan Note (Signed)
Patient complains of reflux symptoms and some dysphagia.  Will start her on Protonix 40 mg daily to be taken 30 to 60 minutes before breakfast.  She will take this for 30 days.  I will refer to GI as well.

## 2022-07-22 NOTE — Assessment & Plan Note (Signed)
Still has some residual numbness and weakness on the right side.  She will see neurology as planned.  She will continue Plavix 75 mg daily and aspirin 81 mg daily.  If she has any recurrent or new stroke symptoms she will seek medical attention immediately.

## 2022-07-22 NOTE — Assessment & Plan Note (Signed)
Chronic issue.  Above goal.  We are checking a BMP today and as long as her kidney function and potassium are acceptable we will increase her spironolactone to 25 mg daily.  She will continue carvedilol 25 mg twice daily and telmisartan 80 mg daily.

## 2022-07-25 ENCOUNTER — Other Ambulatory Visit: Payer: Self-pay | Admitting: Psychiatry

## 2022-07-25 ENCOUNTER — Other Ambulatory Visit: Payer: Self-pay | Admitting: Family Medicine

## 2022-07-25 DIAGNOSIS — F3131 Bipolar disorder, current episode depressed, mild: Secondary | ICD-10-CM

## 2022-07-25 DIAGNOSIS — F4310 Post-traumatic stress disorder, unspecified: Secondary | ICD-10-CM

## 2022-07-27 ENCOUNTER — Other Ambulatory Visit (INDEPENDENT_AMBULATORY_CARE_PROVIDER_SITE_OTHER): Payer: 59

## 2022-07-27 DIAGNOSIS — R1013 Epigastric pain: Secondary | ICD-10-CM

## 2022-07-27 LAB — CBC
HCT: 40.1 % (ref 36.0–46.0)
Hemoglobin: 13.7 g/dL (ref 12.0–15.0)
MCHC: 34.2 g/dL (ref 30.0–36.0)
MCV: 92.9 fl (ref 78.0–100.0)
Platelets: 232 10*3/uL (ref 150.0–400.0)
RBC: 4.31 Mil/uL (ref 3.87–5.11)
RDW: 12.4 % (ref 11.5–15.5)
WBC: 9.6 10*3/uL (ref 4.0–10.5)

## 2022-07-28 ENCOUNTER — Telehealth: Payer: Self-pay | Admitting: Family Medicine

## 2022-07-28 NOTE — Telephone Encounter (Signed)
Lft pt vm to call ofc . thanks 

## 2022-07-29 ENCOUNTER — Telehealth: Payer: Self-pay | Admitting: Family Medicine

## 2022-07-29 NOTE — Telephone Encounter (Signed)
Patient returned call from [facility].  I spoke with Rasheedah and transferred call to her.

## 2022-07-29 NOTE — Telephone Encounter (Signed)
Lft pt vm to call ofc . thanks 

## 2022-08-02 ENCOUNTER — Emergency Department: Payer: 59

## 2022-08-02 ENCOUNTER — Other Ambulatory Visit: Payer: Self-pay

## 2022-08-02 ENCOUNTER — Emergency Department
Admission: EM | Admit: 2022-08-02 | Discharge: 2022-08-02 | Disposition: A | Discharge status: Home / Self Care | Payer: 59 | Attending: Emergency Medicine | Admitting: Emergency Medicine

## 2022-08-02 DIAGNOSIS — R4781 Slurred speech: Secondary | ICD-10-CM | POA: Diagnosis not present

## 2022-08-02 DIAGNOSIS — E119 Type 2 diabetes mellitus without complications: Secondary | ICD-10-CM | POA: Insufficient documentation

## 2022-08-02 DIAGNOSIS — R29818 Other symptoms and signs involving the nervous system: Secondary | ICD-10-CM | POA: Diagnosis not present

## 2022-08-02 DIAGNOSIS — R202 Paresthesia of skin: Secondary | ICD-10-CM | POA: Diagnosis not present

## 2022-08-02 DIAGNOSIS — R519 Headache, unspecified: Secondary | ICD-10-CM | POA: Diagnosis not present

## 2022-08-02 DIAGNOSIS — R29810 Facial weakness: Secondary | ICD-10-CM

## 2022-08-02 DIAGNOSIS — I160 Hypertensive urgency: Secondary | ICD-10-CM | POA: Insufficient documentation

## 2022-08-02 DIAGNOSIS — Z743 Need for continuous supervision: Secondary | ICD-10-CM | POA: Diagnosis not present

## 2022-08-02 DIAGNOSIS — R531 Weakness: Secondary | ICD-10-CM

## 2022-08-02 DIAGNOSIS — G4489 Other headache syndrome: Secondary | ICD-10-CM | POA: Diagnosis not present

## 2022-08-02 DIAGNOSIS — E039 Hypothyroidism, unspecified: Secondary | ICD-10-CM | POA: Diagnosis not present

## 2022-08-02 DIAGNOSIS — I10 Essential (primary) hypertension: Secondary | ICD-10-CM

## 2022-08-02 DIAGNOSIS — Z79899 Other long term (current) drug therapy: Secondary | ICD-10-CM | POA: Diagnosis not present

## 2022-08-02 DIAGNOSIS — I6930 Unspecified sequelae of cerebral infarction: Secondary | ICD-10-CM | POA: Diagnosis not present

## 2022-08-02 DIAGNOSIS — R6889 Other general symptoms and signs: Secondary | ICD-10-CM | POA: Diagnosis not present

## 2022-08-02 LAB — COMPREHENSIVE METABOLIC PANEL
ALT: 13 U/L (ref 0–44)
AST: 15 U/L (ref 15–41)
Albumin: 3.7 g/dL (ref 3.5–5.0)
Alkaline Phosphatase: 81 U/L (ref 38–126)
Anion gap: 12 (ref 5–15)
BUN: 13 mg/dL (ref 8–23)
CO2: 25 mmol/L (ref 22–32)
Calcium: 9 mg/dL (ref 8.9–10.3)
Chloride: 100 mmol/L (ref 98–111)
Creatinine, Ser: 0.87 mg/dL (ref 0.44–1.00)
GFR, Estimated: >60 mL/min (ref >60)
Glucose, Bld: 87 mg/dL (ref 70–99)
Potassium: 3.7 mmol/L (ref 3.5–5.1)
Sodium: 137 mmol/L (ref 135–145)
Total Bilirubin: 0.8 mg/dL (ref 0.3–1.2)
Total Protein: 6.8 g/dL (ref 6.5–8.1)

## 2022-08-02 LAB — CBC
HCT: 37.5 % (ref 36.0–46.0)
Hemoglobin: 12.7 g/dL (ref 12.0–15.0)
MCH: 31.6 pg (ref 26.0–34.0)
MCHC: 33.9 g/dL (ref 30.0–36.0)
MCV: 93.3 fL (ref 80.0–100.0)
Platelets: 185 10*3/uL (ref 150–400)
RBC: 4.02 MIL/uL (ref 3.87–5.11)
RDW: 11.7 % (ref 11.5–15.5)
WBC: 8.6 10*3/uL (ref 4.0–10.5)
nRBC: 0 % (ref 0.0–0.2)

## 2022-08-02 LAB — DIFFERENTIAL
Abs Immature Granulocytes: 0.04 10*3/uL (ref 0.00–0.07)
Basophils Absolute: 0.1 10*3/uL (ref 0.0–0.1)
Basophils Relative: 1 %
Eosinophils Absolute: 0.3 10*3/uL (ref 0.0–0.5)
Eosinophils Relative: 4 %
Immature Granulocytes: 1 %
Lymphocytes Relative: 28 %
Lymphs Abs: 2.4 10*3/uL (ref 0.7–4.0)
Monocytes Absolute: 0.5 10*3/uL (ref 0.1–1.0)
Monocytes Relative: 5 %
Neutro Abs: 5.3 10*3/uL (ref 1.7–7.7)
Neutrophils Relative %: 61 %

## 2022-08-02 LAB — PROTIME-INR
INR: 1.1 (ref 0.8–1.2)
Prothrombin Time: 14.3 seconds (ref 11.4–15.2)

## 2022-08-02 LAB — APTT: aPTT: 31 seconds (ref 24–36)

## 2022-08-02 LAB — ETHANOL: Alcohol, Ethyl (B): <10 mg/dL (ref <10)

## 2022-08-02 LAB — CBG MONITORING, ED: Glucose-Capillary: 75 mg/dL (ref 70–99)

## 2022-08-02 MED ORDER — SPIRONOLACTONE 25 MG PO TABS: 25.0000 mg | ORAL_TABLET | Freq: Every day | ORAL | 3 refills | Status: DC

## 2022-08-02 MED ORDER — SODIUM CHLORIDE 0.9% FLUSH: 3.0000 mL | Freq: Once | INTRAVENOUS | Status: DC

## 2022-08-02 NOTE — Code Documentation (Signed)
Stroke Response Nurse Documentation Code Documentation  Susan [name] is a 62 y.o. female arriving to [facility] via [facility] on 08/02/2022 with past medical hx of ADD, degenerative disc disease, GERD, HTN, hypothyroidism, DM, HLD, OSA, palpitations, and previous stroke with TNK administration in February of 2024 with residual right sided weakness and numbness. On aspirin 81 mg daily. Code stroke was activated by EMS.   Patient from home where she was LKW at 0900 and now complaining of headache. Patient woke up in her normal state of health of health at 0800 and took her medications. She developed acute right sided headache. EMS reports family endorsed patient was having slurred speech on scene. Patient states that she has had residual numbness and some right sided weakness since her stroke in February. She reports that the only new complaint since yesterday at her baseline is the new right sided headache she is experiencing currently.    Stroke team at the bedside on patient arrival. Labs drawn and patient cleared for CT by Dr. Jacqualine Code. Patient to CT with team. NIHSS 4, see documentation for details and code stroke times. Patient with right facial droop, right leg weakness, right decreased sensation, and dysarthria  on exam. The following imaging was completed:  CT Head. Patient is not a candidate for IV Thrombolytic due to recent stroke in the past 3 months, per MD Patient is not a candidate for IR due to exam not consistent with LVO, per MD.   Care Plan: code stroke cancelled, per MD.   Bedside handoff with ED RN Janett Billow.    Charise Carwin  Stroke Response RN

## 2022-08-02 NOTE — ED Triage Notes (Signed)
Pt arrives via EMS from home w/ c/o HA, dizziness, and HTN that started at 0900 this am. Code stroke called by EMS. Pt does have hx of CVA x3 weeks ago with residual R sided weakness and numbness. Family reported slurred speech to EMS.  206/92 95% ra 61 sinus 99 CBG 18g L AC

## 2022-08-02 NOTE — ED Provider Notes (Signed)
[facility] Provider Note    Event Date/Time   First MD Initiated Contact with Patient 08/02/22 1150     (approximate)   History   No chief complaint on file.   HPI {Remember to add pertinent medical, surgical, social, and/or OB history to HPI:1} Susan [name] is a 62 y.o. female on review of primary care note from March 15 of this year has a recent thalamic stroke and was treated with TNK.  Past medical history includes diabetes hypertension hypothyroidism.  She presented as a code stroke with right-sided facial droop right leg weakness right limb ataxia and decreased sensation and received TNK on February 27 of this year     Physical Exam   Triage Vital Signs: ED Triage Vitals  Enc Vitals Group     BP      Pulse      Resp      Temp      Temp src      SpO2      Weight      Height      Head Circumference      Peak Flow      Pain Score      Pain Loc      Pain Edu?      Excl. in [place]?     Most recent vital signs: There were no vitals filed for this visit.  {Only need to document appropriate and relevant physical exam:1} General: Awake, no distress. *** CV:  Good peripheral perfusion. *** Resp:  Normal effort. *** Abd:  No distention. *** Other:  ***   ED Results / Procedures / Treatments   Labs (all labs ordered are listed, but only abnormal results are displayed) Labs Reviewed  PROTIME-INR  APTT  CBC  DIFFERENTIAL  COMPREHENSIVE METABOLIC PANEL  ETHANOL  I-STAT CREATININE, ED  CBG MONITORING, ED     EKG  ***   RADIOLOGY *** {USE THE WORD "INTERPRETED"!! You MUST document your own interpretation of imaging, as well as the fact that you reviewed the radiologist's report!:1}   PROCEDURES:  Critical Care performed: {CriticalCareYesNo:19197::"Yes, see critical care procedure note(s)","No"}  Procedures   MEDICATIONS ORDERED IN ED: Medications  sodium chloride flush (NS) 0.9 % injection 3 mL (has no  administration in time range)     IMPRESSION / MDM / ASSESSMENT AND PLAN / ED COURSE  I reviewed the triage vital signs and the nursing notes.                              Differential diagnosis includes, but is not limited to, ***  Patient's presentation is most consistent with {EM COPA:27473}  *** {If the patient is on the monitor, remove the brackets and asterisks on the sentence below and remember to document it as a Procedure as well. Otherwise delete the sentence below:1} {**The patient is on the cardiac monitor to evaluate for evidence of arrhythmia and/or significant heart rate changes.**} {Remember to include, when applicable, any/all of the following data: independent review of imaging independent review of labs (comment specifically on pertinent positives and negatives) review of specific prior hospitalizations, PCP/specialist notes, etc. discuss meds given and prescribed document any discussion with consultants (including hospitalists) any clinical decision tools you used and why (PECARN, NEXUS, etc.) did you consider admitting the patient? document social determinants of health affecting patient's care (homelessness, inability to follow up in a timely fashion, etc)  document any pre-existing conditions increasing risk on current visit (e.g. diabetes and HTN increasing danger of high-risk chest pain/ACS) describes what meds you gave (especially parenteral) and why any other interventions?:1} Clinical Course as of 08/02/22 1150  Tue Aug 02, 2022  1150 Is seen and evaluated on EMS stretcher.  Patient began experiencing a headache with neurologic symptoms.  Currently with Dr. Quinn Axe. [MQ]    Clinical Course User Index [MQ] Delman Kitten, MD   Patient is deemed not a thrombolytic candidate per neurology, Dr. Quinn Axe, given the proximity of her recent stroke and treatment with thrombolytic at that time less than 1 month ago.  FINAL CLINICAL IMPRESSION(S) / ED DIAGNOSES   Final  diagnoses:  None     Rx / DC Orders   ED Discharge Orders     None        Note:  This document was prepared using Dragon voice recognition software and may include unintentional dictation errors.

## 2022-08-02 NOTE — Progress Notes (Signed)
  Chaplain On-Call responded to Code Stroke notification at 1142 hours.  The patient was already in the CT Scan area. No family was present. ED Staff will page Chaplain if additional support is requested.  At 1202, Code Stroke cancelled per Dr. Livia Snellen.  Chaplain Pollyann Samples M.Div., [facility]

## 2022-08-02 NOTE — Discharge Instructions (Addendum)
As we discussed, please increase your spironolactone dose to 25 mg daily.  Follow-up closely with your primary care doctor and neurology  Return to ER right away if you have a severe headache, new numbness, new weakness, difficulty speaking, chest pain confusion or other concerns arise

## 2022-08-02 NOTE — ED Notes (Signed)
Code stroke  cnl per Dr Quinn Axe  MD

## 2022-08-02 NOTE — Consult Note (Signed)
NEUROLOGY CONSULTATION NOTE   Date of service: August 02, 2022 Patient Name: Susan [name] MRN:  [id number] DOB:  Nov 01, 1960 Reason for consult: stroke code Requesting physician: Dr. Delman Kitten _ _ _   _ __   _ __ _ _  __ __   _ __   __ _  History of Present Illness   This is a 62 yo woman with DM2, HL, HTN, OSA, bippolar disorder, recent admission for L thalamic stroke on 07/05/22 for which she received TNK who is BIB EMS as code stroke. Residual sx from recent stroke are dysarthria, RLE weakness, R NLF flattening, and R sensory deficit. She presented to ED 3 weeks ago c/o headache in setting of SBP>190. She was treated for hypertensive urgency and released. Today she felt a similar headache and took her BP and again found SBP > 190 so she called EMS. EMS called code stroke en route bc they were not sure if her R sided deficits were new or old. Patient reports they are chronic since her stroke in February and denies any new focal neurologic deficits at this time. NIHSS = 4. CT head wo showed no acute findings on personal review. TNK was not administered bc patient had no new deficits and because she had the contraindication of stroke within the past 3 mos. Exam was not c/w LVO therefore CTA was not performed.    ROS   Per HPI: all other systems reviewed and are negative  Past History   I have reviewed the following:  Past Medical History:  Diagnosis Date   ADD (attention deficit disorder)    Alcohol abuse    Anemia    Anxiety    Aortic atherosclerosis ([place]) 02/20/2018   Chest CT Sept 2019   Arthritis    rheumatoid arthritis   Asthma    Bipolar disorder ([place])    Constipation    Degenerative disc disease at L5-S1 level 09/28/2016   See ortho note May 2018   Depression    bipolar, hx of suicide attempt   Diabetes mellitus, type 2 ([place])    Diverticulitis 2013   DOE (dyspnea on exertion)    Drug use    GERD (gastroesophageal reflux disease)    H/O suicide attempt    slit  wrists   Hepatitis C 06/26/2014   Hip pain    History of echocardiogram    a. 04/2019 Echo: EF >65%, nl RV fxn.   History of MRSA infection 2013   HLD (hyperlipidemia)    Hypertension    a. 06/2021 Renal Duplex: ? bilat RAS; b. 06/2021 CTA Abd: No signif RAS.   Hyponatremia    Hypothyroidism    Incisional hernia 11/08/2012   Knee pain    Multinodular thyroid    OSA (obstructive sleep apnea)    Osteoporosis    Palpitations    Post-traumatic osteoarthritis of right knee 09/24/2015   Recurrent ventral hernia 11/08/2012   Status post total right knee replacement using cement 05/10/2016   Vitamin B12 deficiency    Vitamin D deficiency disease    Past Surgical History:  Procedure Laterality Date   BILATERAL SALPINGOOPHORECTOMY     due to abnormal mass   BREAST BIOPSY Left    neg   BREAST SURGERY Left 20 yrs ago   CESAREAN SECTION     COLONOSCOPY     COLONOSCOPY WITH PROPOFOL N/A 10/16/2017   Procedure: COLONOSCOPY WITH PROPOFOL;  Surgeon: Jonathon Bellows, MD;  Location: [facility] ENDOSCOPY;  Service: Gastroenterology;  Laterality: N/A;   COLONOSCOPY WITH PROPOFOL N/A 02/16/2021   Procedure: COLONOSCOPY WITH PROPOFOL;  Surgeon: Jonathon Bellows, MD;  Location: [facility] ENDOSCOPY;  Service: Gastroenterology;  Laterality: N/A;   HERNIA REPAIR  06/2011, July 2014   Ventral wall repair with Physiomesh   HERNIA REPAIR     2nd.vental wall repair   JOINT REPLACEMENT Right    knee   TONSILLECTOMY     TOTAL HIP ARTHROPLASTY Left 07/23/2019   Procedure: TOTAL HIP ARTHROPLASTY;  Surgeon: Corky Mull, MD;  Location: [facility] ORS;  Service: Orthopedics;  Laterality: Left;   TOTAL KNEE ARTHROPLASTY Right 05/10/2016   Procedure: TOTAL KNEE ARTHROPLASTY;  Surgeon: Corky Mull, MD;  Location: [facility] ORS;  Service: Orthopedics;  Laterality: Right;   TUBAL LIGATION     Family History  Problem Relation Age of Onset   Arthritis Mother    Asthma Mother    Mental illness Mother    Thyroid disease Mother    COPD Mother     Heart disease Mother    Congestive Heart Failure Mother    Alcohol abuse Mother    Eating disorder Mother    Bipolar disorder Mother    Alcohol abuse Father    Heart attack Father    Alcohol abuse Sister    Drug abuse Sister    Mental illness Sister    Mental illness Sister    Fibromyalgia Sister    Obesity Sister    Pneumonia Sister    Depression Sister    Mental illness Sister    Alcohol abuse Sister    Drug abuse Sister    Arthritis Brother    Mental illness Brother    Cancer Brother        non-hodkins lymphoma   Drug abuse Daughter    Drug abuse Son    Alcohol abuse Son    Drug abuse Son    Alcohol abuse Son    Diabetes Neg Hx    Stroke Neg Hx    Breast cancer Neg Hx    Thyroid cancer Neg Hx    Social History   Socioeconomic History   Marital status: Widowed    Spouse name: Not on file   Number of children: 3   Years of education: Not on file   Highest education level: Some college, no degree  Occupational History   Occupation: disabled  Tobacco Use   Smoking status: Every Day    Packs/day: 0.50    Years: 41.00    Additional pack years: 0.00    Total pack years: 20.50    Types: Cigarettes   Smokeless tobacco: Never  Vaping Use   Vaping Use: Never used  Substance and Sexual Activity   Alcohol use: No    Alcohol/week: 0.0 standard drinks of 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   Drug use: No    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   Sexual activity: Not Currently  Other Topics Concern   Not on file  Social History Narrative   Not on file   Social Determinants of Health   Financial Resource Strain: Low Risk  (06/02/2021)   Overall Financial Resource Strain (CARDIA)    Difficulty of Paying Living Expenses: Not hard at all  Food Insecurity: No Food Insecurity (07/05/2022)   Hunger Vital Sign    Worried About Running Out of Food in the Last Year: Never true    [place] in the  Last Year: Never true  Transportation  Needs: No Transportation Needs (07/05/2022)   PRAPARE - Hydrologist (Medical): No    Lack of Transportation (Non-Medical): No  Physical Activity: Sufficiently Active (06/02/2021)   Exercise Vital Sign    Days of Exercise per Week: 5 days    Minutes of Exercise per Session: 30 min  Stress: No Stress Concern Present (06/02/2021)   [place]    Feeling of Stress : Not at all  Social Connections: Moderately Isolated (06/02/2021)   Social Connection and Isolation Panel [NHANES]    Frequency of Communication with Friends and Family: More than three times a week    Frequency of Social Gatherings with Friends and Family: Three times a week    Attends Religious Services: Never    Active Member of Clubs or Organizations: Yes    Attends Archivist Meetings: More than 4 times per year    Marital Status: Widowed   Allergies  Allergen Reactions   Wellbutrin [Bupropion]     Patient reports made " deathly sick " years ago at appointment 03/18/20.   Lasix [Furosemide] Other (See Comments)    Electrolyte imbalance    Medications   (Not in a hospital admission)     Current Facility-Administered Medications:    sodium chloride flush (NS) 0.9 % injection 3 mL, 3 mL, Intravenous, Once, Delman Kitten, MD  Current Outpatient Medications:    acetaminophen (TYLENOL) 325 MG tablet, Take 650 mg by mouth every 6 (six) hours as needed., Disp: , Rfl:    albuterol (VENTOLIN HFA) 108 (90 Base) MCG/ACT inhaler, Inhale 2 puffs into the lungs every 6 (six) hours as needed for wheezing or shortness of breath., Disp: 8 g, Rfl: 2   aspirin EC 81 MG tablet, Take 1 tablet (81 mg total) by mouth daily. Swallow whole., Disp: 30 tablet, Rfl: 12   carvedilol (COREG) 25 MG tablet, TAKE 1 TABLET BY MOUTH TWICE DAILY WITH MEAL. MONITOR HEART RATE, Disp: 60 tablet, Rfl: 3   clotrimazole (LOTRIMIN) 1 % cream, Apply 1  application topically 2 (two) times daily as needed., Disp: , Rfl:    cyclobenzaprine (FLEXERIL) 5 MG tablet, Take 5 mg by mouth 2 (two) times daily as needed for muscle spasms. , Disp: , Rfl:    DULoxetine (CYMBALTA) 20 MG capsule, TAKE 1 CAPSULE BY MOUTH EVERY DAY WITH 30MG  CAPSULE, Disp: 30 capsule, Rfl: 1   DULoxetine (CYMBALTA) 30 MG capsule, TAKE 1 CAPSULE BY MOUTH DAILY, Disp: 30 capsule, Rfl: 1   gabapentin (NEURONTIN) 300 MG capsule, Take 600 mg by mouth 2 (two) times daily. , Disp: , Rfl:    glucose blood (ACCU-CHEK GUIDE) test strip, TEST UP TO FOUR TIMES A DAY, Disp: 100 each, Rfl: 0   HYDROcodone-acetaminophen (NORCO/VICODIN) 5-325 MG tablet, Take 1 tablet by mouth 3 (three) times daily as needed for moderate pain., Disp: , Rfl:    naloxone (NARCAN) nasal spray 4 mg/0.1 mL, Place into the nose., Disp: , Rfl:    pantoprazole (PROTONIX) 40 MG tablet, Take 1 tablet (40 mg total) by mouth daily., Disp: 30 tablet, Rfl: 0   rosuvastatin (CRESTOR) 20 MG tablet, Take 1 tablet (20 mg total) by mouth daily., Disp: 30 tablet, Rfl: 3   Semaglutide,0.25 or 0.5MG /DOS, 2 MG/3ML SOPN, Inject 0.25 mg into the skin once a week., Disp: 3 mL, Rfl: 1   spironolactone (ALDACTONE) 25 MG tablet, Take 0.5  tablets (12.5 mg total) by mouth daily., Disp: 45 tablet, Rfl: 3   telmisartan (MICARDIS) 80 MG tablet, TAKE 1 TABLET(80 MG) BY MOUTH DAILY, Disp: 90 tablet, Rfl: 1   Vitamin D, Ergocalciferol, (DRISDOL) 1.25 MG (50000 UNIT) CAPS capsule, NEW PRESCRIPTION REQUEST: TAKE ONE CAPSULE BY MOUTH EVERY WEEK, Disp: 13 capsule, Rfl: 1  Vitals   There were no vitals filed for this visit.   There is no height or weight on file to calculate BMI.  Physical Exam   Physical Exam Gen: A&O x4, NAD HEENT: Atraumatic, normocephalic;mucous membranes moist; oropharynx clear, tongue without atrophy or fasciculations. Neck: Supple, trachea midline. Resp: CTAB, no w/r/r CV: RRR, no m/g/r; nml S1 and S2. 2+ symmetric  peripheral pulses. Abd: soft/NT/ND; nabs x 4 quad Extrem: Nml bulk; no cyanosis, clubbing, or edema.  Neuro: *MS: A&O x4. Follows multi-step commands.  *Speech: fluid, mildly dysarthric, able to name and repeat *CN:    I: Deferred   II,III: PERRLA, VFF by confrontation, optic discs unable to be visualized 2/2 pupillary constriction   III,IV,VI: EOMI w/o nystagmus, no ptosis   V: impaired to LT on L   VII: Eyelid closure was full.  R NLF flattening   VIII: Hearing intact to voice   IX,X: Voice normal, palate elevates symmetrically    XI: SCM/trap 5/5 bilat   XII: Tongue protrudes midline, no atrophy or fasciculations   *Motor:   Normal bulk.  No tremor, rigidity or bradykinesia. Slight drift but not to bed RLE, all other extremities full strength *Sensory: Impaired to LT on R. Propioception intact bilat.  No double-simultaneous extinction.  *Coordination:  Finger-to-nose, heel-to-shin, rapid alternating motions were intact. *Reflexes:  1+ and symmetric throughout without clonus; toes down-going bilat *Gait: deferred  NIHSS = 4 (dysarthria, R NLF flattening, RLE drift, R sensory deficit)   Premorbid mRS = 2   Labs   CBC:  Recent Labs  Lab 07/27/22 0957  WBC 9.6  HGB 13.7  HCT 40.1  MCV 92.9  PLT 0000000    Basic Metabolic Panel:  Lab Results  Component Value Date   NA 135 07/22/2022   K 3.8 07/22/2022   CO2 24 07/22/2022   GLUCOSE 93 07/22/2022   BUN 14 07/22/2022   CREATININE 1.11 07/22/2022   CALCIUM 9.3 07/22/2022   GFRNONAA >60 07/12/2022   GFRAA 64 05/28/2020   Lipid Panel:  Lab Results  Component Value Date   LDLCALC 128 (H) 07/06/2022   HgbA1c:  Lab Results  Component Value Date   HGBA1C 5.9 06/28/2022   Urine Drug Screen:     Component Value Date/Time   LABOPIA POSITIVE (A) 05/10/2016 0616   COCAINSCRNUR NONE DETECTED 05/10/2016 0616   LABBENZ NONE DETECTED 05/10/2016 0616   AMPHETMU NONE DETECTED 05/10/2016 0616   THCU NONE DETECTED  05/10/2016 0616   LABBARB NONE DETECTED 05/10/2016 0616    Alcohol Level     Component Value Date/Time   ETH <10 07/05/2022 1456    CT Head without contrast: No acute process on personal review   Impression   This is a 62 yo woman with DM2, HL, HTN, OSA, bippolar disorder, recent admission for L thalamic stroke on 07/05/22 for which she received TNK who called EMS for headache and SBP>190 (same presentation to ED 3 wks ago). Stroke code was called en route for R sided deficits that patient states are chronic since her last stroke. CT head showed no acute process. Given that patient has no new  deficits, stroke code was cancelled.  Recommendations   - No further specific neurologic workup indicated at this time - F/u as scheduled with outpatient neurology - BP, headache mgmt per ED. Avoid triptans (contraindicated in patients with prior stroke)  Neurology to be available prn for questions going forward ______________________________________________________________________   Thank you for the opportunity to take part in the care of this patient. If you have any further questions, please contact the neurology consultation attending.  Signed,  Su Monks, MD [facility] [phone]  If 7pm- 7am, please page neurology on call as listed in [place].  **Any copied and pasted documentation in this note was written by me in another application not billed for and pasted by me into this document.

## 2022-08-03 ENCOUNTER — Telehealth: Payer: Self-pay

## 2022-08-03 NOTE — Telephone Encounter (Signed)
Reviewed

## 2022-08-03 NOTE — Transitions of Care (Post Inpatient/ED Visit) (Signed)
   08/03/2022  Name: Susan [name] MRN: [id number] DOB: Aug 10, 1960  Today's TOC FU Call Status: Today's TOC FU Call Status:: Successful TOC FU Call Competed TOC FU Call Complete Date: 08/03/22  Pt scheduled with Enis Gash, NP due to appointment availability.    Transition Care Management Follow-up Telephone Call Date of Discharge: 08/02/22 Discharge Facility: [facility]) Type of Discharge: Emergency Department Primary Inpatient Discharge Diagnosis:: Hypertensive Urgency Reason for ED Visit: Cardiac Conditions How have you been since you were released from the hospital?: Better (BP better than yesterday but still elevated.) Any questions or concerns?: Yes Patient Questions/Concerns:: Need to "get my blood pressure figured out." Patient Questions/Concerns Addressed: Notified Provider of Patient Questions/Concerns  Items Reviewed: Did you receive and understand the discharge instructions provided?: Yes Medications obtained and verified?: Yes (Medications Reviewed) Any new allergies since your discharge?: No Dietary orders reviewed?: NA Do you have support at home?: No People in Home: alone  Home Care and Equipment/Supplies: [facility] Ordered?: No Any new equipment or medical supplies ordered?: No  Functional Questionnaire: Do you need assistance with bathing/showering or dressing?: No Do you need assistance with meal preparation?: No Do you need assistance with eating?: No Do you have difficulty maintaining continence: No Do you have difficulty managing or taking your medications?: No  Follow up appointments reviewed: PCP Follow-up appointment confirmed?: Yes Date of PCP follow-up appointment?: 08/03/22 Follow-up Provider: Enis Gash, NP [facility] Follow-up appointment confirmed?: NA Do you need transportation to your follow-up appointment?: No Do you understand care options if your condition(s) worsen?: Yes-patient  verbalized understanding    SIGNATURE Ferne Reus, RN

## 2022-08-04 ENCOUNTER — Ambulatory Visit (INDEPENDENT_AMBULATORY_CARE_PROVIDER_SITE_OTHER): Payer: 59 | Admitting: Nurse Practitioner

## 2022-08-04 ENCOUNTER — Ambulatory Visit: Payer: Self-pay | Admitting: Neurosurgery

## 2022-08-04 ENCOUNTER — Telehealth: Payer: Self-pay

## 2022-08-04 VITALS — BP 158/80 | HR 67 | Temp 97.8°F | Ht 62.0 in | Wt 222.8 lb

## 2022-08-04 DIAGNOSIS — I639 Cerebral infarction, unspecified: Secondary | ICD-10-CM

## 2022-08-04 DIAGNOSIS — I69351 Hemiplegia and hemiparesis following cerebral infarction affecting right dominant side: Secondary | ICD-10-CM | POA: Diagnosis not present

## 2022-08-04 DIAGNOSIS — I10 Essential (primary) hypertension: Secondary | ICD-10-CM | POA: Diagnosis not present

## 2022-08-04 NOTE — Assessment & Plan Note (Addendum)
Continues to have mild right sided numbness and weakness. She is doing physical therapy. Completed Plavix, continue ASA 81 mg daily. Follow up with Neurology as scheduled.

## 2022-08-04 NOTE — Progress Notes (Signed)
Tomasita Morrow, NP-C Phone: [phone]  Susan [name] is a 62 y.o. female who presents today for ED follow up.   Patient was seen in the ED on 08/02/2022 for hypertensive urgency. CT- WNL. Spironolactone was increased to 25 mg daily. Note, imaging and lab work reviewed. She did have a stroke in February. She received TNK at that time. She continues to have mild right sided numbness and weakness. She is taking Aspirin and Crestor daily. She is scheduled to see Neurology in April. Denies headaches or vision changes today.   HYPERTENSION Disease Monitoring Home BP Monitoring- 150-170/90 Chest pain- No    Dyspnea- No Medications Compliance-  Spironolactone, Coreg, Telmisartan. Lightheadedness-  No  Edema- No BMET    Component Value Date/Time   NA 137 08/02/2022 1153   NA 137 05/28/2020 0903   NA 136 04/29/2014 1133   K 3.7 08/02/2022 1153   K 4.2 04/29/2014 1133   CL 100 08/02/2022 1153   CL 103 04/29/2014 1133   CO2 25 08/02/2022 1153   CO2 30 04/29/2014 1133   GLUCOSE 87 08/02/2022 1153   GLUCOSE 85 04/29/2014 1133   BUN 13 08/02/2022 1153   BUN 9 05/28/2020 0903   BUN 9 04/29/2014 1133   CREATININE 0.87 08/02/2022 1153   CREATININE 0.92 10/16/2019 1139   CALCIUM 9.0 08/02/2022 1153   CALCIUM 9.1 04/29/2014 1133   GFRNONAA >60 08/02/2022 1153   GFRNONAA 69 10/16/2019 1139   GFRAA 64 05/28/2020 0903   GFRAA 80 10/16/2019 1139    Social History   Tobacco Use  Smoking Status Every Day   Packs/day: 0.50   Years: 41.00   Additional pack years: 0.00   Total pack years: 20.50   Types: Cigarettes  Smokeless Tobacco Never    Current Outpatient Medications on File Prior to Visit  Medication Sig Dispense Refill   acetaminophen (TYLENOL) 325 MG tablet Take 650 mg by mouth every 6 (six) hours as needed.     albuterol (VENTOLIN HFA) 108 (90 Base) MCG/ACT inhaler Inhale 2 puffs into the lungs every 6 (six) hours as needed for wheezing or shortness of breath. 8 g 2   aspirin  EC 81 MG tablet Take 1 tablet (81 mg total) by mouth daily. Swallow whole. 30 tablet 12   carvedilol (COREG) 25 MG tablet TAKE 1 TABLET BY MOUTH TWICE DAILY WITH MEAL. MONITOR HEART RATE 60 tablet 3   clotrimazole (LOTRIMIN) 1 % cream Apply 1 application topically 2 (two) times daily as needed.     cyclobenzaprine (FLEXERIL) 5 MG tablet Take 5 mg by mouth 2 (two) times daily as needed for muscle spasms.      DULoxetine (CYMBALTA) 20 MG capsule TAKE 1 CAPSULE BY MOUTH EVERY DAY WITH 30MG  CAPSULE 30 capsule 1   DULoxetine (CYMBALTA) 30 MG capsule TAKE 1 CAPSULE BY MOUTH DAILY 30 capsule 1   gabapentin (NEURONTIN) 300 MG capsule Take 600 mg by mouth 2 (two) times daily.      glucose blood (ACCU-CHEK GUIDE) test strip TEST UP TO FOUR TIMES A DAY 100 each 0   HYDROcodone-acetaminophen (NORCO/VICODIN) 5-325 MG tablet Take 1 tablet by mouth 3 (three) times daily as needed for moderate pain.     naloxone (NARCAN) nasal spray 4 mg/0.1 mL Place into the nose.     pantoprazole (PROTONIX) 40 MG tablet Take 1 tablet (40 mg total) by mouth daily. 30 tablet 0   rosuvastatin (CRESTOR) 20 MG tablet Take 1 tablet (20 mg total)  by mouth daily. 30 tablet 3   Semaglutide,0.25 or 0.5MG /DOS, 2 MG/3ML SOPN Inject 0.25 mg into the skin once a week. 3 mL 1   spironolactone (ALDACTONE) 25 MG tablet Take 1 tablet (25 mg total) by mouth daily. 45 tablet 3   telmisartan (MICARDIS) 80 MG tablet TAKE 1 TABLET(80 MG) BY MOUTH DAILY 90 tablet 1   Vitamin D, Ergocalciferol, (DRISDOL) 1.25 MG (50000 UNIT) CAPS capsule NEW PRESCRIPTION REQUEST: TAKE ONE CAPSULE BY MOUTH EVERY WEEK 13 capsule 1   No current facility-administered medications on file prior to visit.    ROS see history of present illness  Objective  Physical Exam Vitals:   08/04/22 0825 08/04/22 0847  BP: (!) 160/82 (!) 158/80  Pulse: 67   Temp: 97.8 F (36.6 C)   SpO2: 99%     BP Readings from Last 3 Encounters:  08/04/22 (!) 158/80  08/02/22 (!) 187/80   07/22/22 124/86   Wt Readings from Last 3 Encounters:  08/04/22 222 lb 12.8 oz (101.1 kg)  08/02/22 217 lb 9.5 oz (98.7 kg)  07/22/22 217 lb 12.8 oz (98.8 kg)    Physical Exam Constitutional:      General: She is not in acute distress.    Appearance: Normal appearance.  HENT:     Head: Normocephalic.  Cardiovascular:     Rate and Rhythm: Normal rate and regular rhythm.     Heart sounds: Normal heart sounds.  Pulmonary:     Effort: Pulmonary effort is normal.     Breath sounds: Normal breath sounds.  Skin:    General: Skin is warm and dry.  Neurological:     Mental Status: She is alert.  Psychiatric:        Mood and Affect: Mood normal.        Behavior: Behavior normal.    Assessment/Plan: Please see individual problem list.  Primary hypertension Assessment & Plan: Continues to be elevated. Seen in ED for hypertensive urgency 2 days ago. Spironolactone increased from 12.5 to 25 mg daily at that time. Encouraged patient to continue medications as prescribed as it can take the Spironolactone time to start working as it is supposed to- continue Spironolactone 25 mg daily, Coreg 25 mg twice daily and Telmisartan 80 mg daily. Advised to continue checking blood pressure daily and keep a log. She will return in 2 weeks for a blood pressure check. Strict return precautions given to patient.    Cerebrovascular accident (CVA), unspecified mechanism ([place]) Assessment & Plan: Continues to have mild right sided numbness and weakness. She is doing physical therapy. Completed Plavix, continue ASA 81 mg daily. Follow up with Neurology as scheduled.     Return in about 2 weeks (around 08/18/2022) for Blood pressure check.   Tomasita Morrow, NP-C [place]

## 2022-08-04 NOTE — Telephone Encounter (Signed)
Called pt to inform her that kacy and her pcp stated she should continue to take her BP meds as prescribed and to f/u in 2 weeks for a BP check. Pt has been scheduled a [place] appt.

## 2022-08-04 NOTE — Assessment & Plan Note (Addendum)
Continues to be elevated. Seen in ED for hypertensive urgency 2 days ago. Spironolactone increased from 12.5 to 25 mg daily at that time. Encouraged patient to continue medications as prescribed as it can take the Spironolactone time to start working as it is supposed to- continue Spironolactone 25 mg daily, Coreg 25 mg twice daily and Telmisartan 80 mg daily. Advised to continue checking blood pressure daily and keep a log. She will return in 2 weeks for a blood pressure check. Strict return precautions given to patient.

## 2022-08-10 ENCOUNTER — Telehealth: Payer: Self-pay

## 2022-08-10 ENCOUNTER — Ambulatory Visit
Admission: RE | Admit: 2022-08-10 | Discharge: 2022-08-10 | Disposition: A | Discharge status: Home / Self Care | Payer: 59 | Source: Ambulatory Visit | Attending: Family Medicine | Admitting: Family Medicine

## 2022-08-10 DIAGNOSIS — F4310 Post-traumatic stress disorder, unspecified: Secondary | ICD-10-CM

## 2022-08-10 DIAGNOSIS — F3131 Bipolar disorder, current episode depressed, mild: Secondary | ICD-10-CM

## 2022-08-10 DIAGNOSIS — E049 Nontoxic goiter, unspecified: Secondary | ICD-10-CM | POA: Insufficient documentation

## 2022-08-10 DIAGNOSIS — E041 Nontoxic single thyroid nodule: Secondary | ICD-10-CM | POA: Diagnosis not present

## 2022-08-10 NOTE — Telephone Encounter (Signed)
Susan [name] from [facility] called states that they need a rx for the Duloxetine 20mg . pt states that she takes both the 20mg  and 30mg .

## 2022-08-11 ENCOUNTER — Other Ambulatory Visit: Payer: Self-pay | Admitting: Family Medicine

## 2022-08-11 ENCOUNTER — Telehealth: Payer: Self-pay | Admitting: Family Medicine

## 2022-08-11 ENCOUNTER — Telehealth: Payer: Self-pay

## 2022-08-11 DIAGNOSIS — E041 Nontoxic single thyroid nodule: Secondary | ICD-10-CM

## 2022-08-11 NOTE — Telephone Encounter (Signed)
Pt called in staying that if her referral for ENT can be change to another location or provider. As per pt, they asking her to pay 600$ at the time of visit. Any questions, she's available [phone]

## 2022-08-11 NOTE — Telephone Encounter (Signed)
Attempted to contact patient to verify that she wanted medications sent to this pharmacy[facility]home [facility] in [place].  Patient advised to call us back.

## 2022-08-11 NOTE — Telephone Encounter (Signed)
LMOM to CB for Thyroid US results

## 2022-08-11 NOTE — Telephone Encounter (Signed)
Pt returned [facility] CMA call. Transferred. 

## 2022-08-12 ENCOUNTER — Telehealth: Payer: Self-pay | Admitting: Family Medicine

## 2022-08-12 DIAGNOSIS — E041 Nontoxic single thyroid nodule: Secondary | ICD-10-CM

## 2022-08-12 MED ORDER — DULOXETINE HCL 20 MG PO CPEP: ORAL_CAPSULE | ORAL | 3 refills | Status: DC

## 2022-08-12 MED ORDER — DULOXETINE HCL 30 MG PO CPEP: 30.0000 mg | ORAL_CAPSULE | Freq: Every day | ORAL | 3 refills | Status: DC

## 2022-08-12 NOTE — Telephone Encounter (Signed)
[facility] does not have any ENT physicians at this time.  I would suggest we refer her to ENT in [facility] as that is the next closest ENT office.  I can place that referral if she is willing.

## 2022-08-12 NOTE — Telephone Encounter (Signed)
I have sent duloxetine to [facility] pharmacy.

## 2022-08-12 NOTE — Telephone Encounter (Signed)
Patient is willing to go to ENT in [place].

## 2022-08-12 NOTE — Telephone Encounter (Signed)
pt called left a message that the pharmacy is the [facility]

## 2022-08-12 NOTE — Telephone Encounter (Signed)
Patient called about her thyroid biopsy. She can not go to [facility], she owes money. Patient would like another referral, could it be sent to [facility]. They did patient's biopsy years ago for her thyroid.

## 2022-08-15 ENCOUNTER — Other Ambulatory Visit (INDEPENDENT_AMBULATORY_CARE_PROVIDER_SITE_OTHER): Payer: 59

## 2022-08-15 DIAGNOSIS — M5412 Radiculopathy, cervical region: Secondary | ICD-10-CM | POA: Diagnosis not present

## 2022-08-15 DIAGNOSIS — M5030 Other cervical disc degeneration, unspecified cervical region: Secondary | ICD-10-CM | POA: Diagnosis not present

## 2022-08-15 DIAGNOSIS — Z79899 Other long term (current) drug therapy: Secondary | ICD-10-CM | POA: Diagnosis not present

## 2022-08-15 DIAGNOSIS — M48062 Spinal stenosis, lumbar region with neurogenic claudication: Secondary | ICD-10-CM | POA: Diagnosis not present

## 2022-08-15 DIAGNOSIS — E559 Vitamin D deficiency, unspecified: Secondary | ICD-10-CM | POA: Diagnosis not present

## 2022-08-15 DIAGNOSIS — M7061 Trochanteric bursitis, right hip: Secondary | ICD-10-CM | POA: Diagnosis not present

## 2022-08-15 DIAGNOSIS — M1611 Unilateral primary osteoarthritis, right hip: Secondary | ICD-10-CM | POA: Diagnosis not present

## 2022-08-15 DIAGNOSIS — M5126 Other intervertebral disc displacement, lumbar region: Secondary | ICD-10-CM | POA: Diagnosis not present

## 2022-08-15 DIAGNOSIS — M5416 Radiculopathy, lumbar region: Secondary | ICD-10-CM | POA: Diagnosis not present

## 2022-08-15 LAB — VITAMIN D 25 HYDROXY (VIT D DEFICIENCY, FRACTURES): VITD: 26.46 ng/mL — ABNORMAL LOW (ref 30.00–100.00)

## 2022-08-15 NOTE — Telephone Encounter (Signed)
Referral placed.

## 2022-08-15 NOTE — Addendum Note (Signed)
Addended by: Glori Luis on: 08/15/2022 09:18 AM   Modules accepted: Orders

## 2022-08-16 ENCOUNTER — Telehealth: Payer: Self-pay

## 2022-08-16 NOTE — Telephone Encounter (Signed)
Noted  

## 2022-08-16 NOTE — Telephone Encounter (Signed)
-----   Message from Glori Luis, MD sent at 08/15/2022  3:30 PM EDT ----- Vitamin D has improved. I would have her start on over the counter vitamin D 2000 IU daily and have that rechecked in 3 months.

## 2022-08-16 NOTE — Telephone Encounter (Signed)
Pt returned [facility] CMA call. Note below from provider was read to her. Pt aware. Pt said she will call another time to book her 3 months lab appt.

## 2022-08-16 NOTE — Telephone Encounter (Signed)
Left message to call the office about lab results.

## 2022-08-18 ENCOUNTER — Other Ambulatory Visit: Payer: Self-pay | Admitting: Psychiatry

## 2022-08-18 ENCOUNTER — Ambulatory Visit: Payer: 59

## 2022-08-18 ENCOUNTER — Other Ambulatory Visit: Payer: Self-pay | Admitting: Family Medicine

## 2022-08-18 ENCOUNTER — Telehealth: Payer: Self-pay

## 2022-08-18 DIAGNOSIS — R262 Difficulty in walking, not elsewhere classified: Secondary | ICD-10-CM | POA: Diagnosis not present

## 2022-08-18 DIAGNOSIS — I10 Essential (primary) hypertension: Secondary | ICD-10-CM

## 2022-08-18 DIAGNOSIS — M5416 Radiculopathy, lumbar region: Secondary | ICD-10-CM | POA: Diagnosis not present

## 2022-08-18 DIAGNOSIS — K219 Gastro-esophageal reflux disease without esophagitis: Secondary | ICD-10-CM

## 2022-08-18 DIAGNOSIS — R278 Other lack of coordination: Secondary | ICD-10-CM | POA: Diagnosis not present

## 2022-08-18 DIAGNOSIS — R1310 Dysphagia, unspecified: Secondary | ICD-10-CM

## 2022-08-18 DIAGNOSIS — F3131 Bipolar disorder, current episode depressed, mild: Secondary | ICD-10-CM

## 2022-08-18 DIAGNOSIS — E559 Vitamin D deficiency, unspecified: Secondary | ICD-10-CM

## 2022-08-18 DIAGNOSIS — F4310 Post-traumatic stress disorder, unspecified: Secondary | ICD-10-CM

## 2022-08-18 NOTE — Progress Notes (Signed)
Pt presented for BP check in office I checked pt BP in her left arm and it was 130/78 O2 was 96 and Hr was 59.Marland KitchenMarland Kitchen

## 2022-08-18 NOTE — Telephone Encounter (Signed)
Left message for Patient to call the office back and schedule lab app around 11/13/22 order is in.

## 2022-08-18 NOTE — Telephone Encounter (Signed)
Patient states at check-out that she believes she is supposed to have her Vitamin D checked again, three months from the last time we checked it on 08/15/2022.  I do not see orders in the system for the lab.  I was unable to reach Prince Solian, CMA, at the time of patient's check-out.  I let patient know I will send a note back to verify and we'll call her back to schedule.

## 2022-08-22 DIAGNOSIS — R262 Difficulty in walking, not elsewhere classified: Secondary | ICD-10-CM | POA: Diagnosis not present

## 2022-08-22 DIAGNOSIS — R278 Other lack of coordination: Secondary | ICD-10-CM | POA: Diagnosis not present

## 2022-08-22 DIAGNOSIS — M5416 Radiculopathy, lumbar region: Secondary | ICD-10-CM | POA: Diagnosis not present

## 2022-08-23 NOTE — Telephone Encounter (Signed)
Labs scheduled for 11/14/22

## 2022-08-24 DIAGNOSIS — R262 Difficulty in walking, not elsewhere classified: Secondary | ICD-10-CM | POA: Diagnosis not present

## 2022-08-24 DIAGNOSIS — R278 Other lack of coordination: Secondary | ICD-10-CM | POA: Diagnosis not present

## 2022-08-24 DIAGNOSIS — M5416 Radiculopathy, lumbar region: Secondary | ICD-10-CM | POA: Diagnosis not present

## 2022-08-29 DIAGNOSIS — E119 Type 2 diabetes mellitus without complications: Secondary | ICD-10-CM | POA: Diagnosis not present

## 2022-08-29 DIAGNOSIS — G4733 Obstructive sleep apnea (adult) (pediatric): Secondary | ICD-10-CM | POA: Diagnosis not present

## 2022-08-29 DIAGNOSIS — I639 Cerebral infarction, unspecified: Secondary | ICD-10-CM | POA: Diagnosis not present

## 2022-08-29 DIAGNOSIS — E041 Nontoxic single thyroid nodule: Secondary | ICD-10-CM | POA: Diagnosis not present

## 2022-08-29 DIAGNOSIS — Z87898 Personal history of other specified conditions: Secondary | ICD-10-CM | POA: Diagnosis not present

## 2022-08-29 DIAGNOSIS — Z87891 Personal history of nicotine dependence: Secondary | ICD-10-CM | POA: Diagnosis not present

## 2022-08-31 ENCOUNTER — Ambulatory Visit (INDEPENDENT_AMBULATORY_CARE_PROVIDER_SITE_OTHER): Payer: 59 | Admitting: Psychiatry

## 2022-08-31 ENCOUNTER — Encounter: Payer: Self-pay | Admitting: Psychiatry

## 2022-08-31 VITALS — BP 147/81 | HR 66 | Temp 98.0°F | Ht 62.0 in | Wt 215.2 lb

## 2022-08-31 DIAGNOSIS — F4310 Post-traumatic stress disorder, unspecified: Secondary | ICD-10-CM

## 2022-08-31 DIAGNOSIS — M5416 Radiculopathy, lumbar region: Secondary | ICD-10-CM | POA: Diagnosis not present

## 2022-08-31 DIAGNOSIS — G4700 Insomnia, unspecified: Secondary | ICD-10-CM

## 2022-08-31 DIAGNOSIS — F1021 Alcohol dependence, in remission: Secondary | ICD-10-CM

## 2022-08-31 DIAGNOSIS — R262 Difficulty in walking, not elsewhere classified: Secondary | ICD-10-CM | POA: Diagnosis not present

## 2022-08-31 DIAGNOSIS — F3176 Bipolar disorder, in full remission, most recent episode depressed: Secondary | ICD-10-CM | POA: Diagnosis not present

## 2022-08-31 DIAGNOSIS — F17200 Nicotine dependence, unspecified, uncomplicated: Secondary | ICD-10-CM | POA: Diagnosis not present

## 2022-08-31 DIAGNOSIS — R278 Other lack of coordination: Secondary | ICD-10-CM | POA: Diagnosis not present

## 2022-08-31 MED ORDER — TRAZODONE HCL 50 MG PO TABS
50.0000 mg | ORAL_TABLET | Freq: Every day | ORAL | 1 refills | Status: DC
Start: 2022-08-31 — End: 2022-11-01

## 2022-08-31 NOTE — Patient Instructions (Signed)
STAYQUITCOACH - APP on your cell phone - its a free app for smoking cessation support.   Trazodone Tablets What is this medication? TRAZODONE (TRAZ oh done) treats depression. It increases the amount of serotonin in the brain, a hormone that helps regulate mood. This medicine may be used for other purposes; ask your health care provider or pharmacist if you have questions. COMMON BRAND NAME(S): Desyrel What should I tell my care team before I take this medication? They need to know if you have any of these conditions: Attempted suicide or thinking about it Bipolar disorder Bleeding problems Glaucoma Heart disease, or previous heart attack Irregular heart beat Kidney or liver disease Low levels of sodium in the blood An unusual or allergic reaction to trazodone, other medications, foods, dyes or preservatives Pregnant or trying to get pregnant Breast-feeding How should I use this medication? Take this medication by mouth with a glass of water. Follow the directions on the prescription label. Take this medication shortly after a meal or a light snack. Take your medication at regular intervals. Do not take your medication more often than directed. Do not stop taking this medication suddenly except upon the advice of your care team. Stopping this medication too quickly may cause serious side effects or your condition may worsen. A special MedGuide will be given to you by the pharmacist with each prescription and refill. Be sure to read this information carefully each time. Talk to your care team regarding the use of this medication in children. Special care may be needed. Overdosage: If you think you have taken too much of this medicine contact a poison control center or emergency room at once. NOTE: This medicine is only for you. Do not share this medicine with others. What if I miss a dose? If you miss a dose, take it as soon as you can. If it is almost time for your next dose, take only  that dose. Do not take double or extra doses. What may interact with this medication? Do not take this medication with any of the following: Certain medications for fungal infections like fluconazole, itraconazole, ketoconazole, posaconazole, voriconazole Cisapride Dronedarone Linezolid MAOIs like Carbex, Eldepryl, Marplan, Nardil, and Parnate Mesoridazine Methylene blue (injected into a vein) Pimozide Saquinavir Thioridazine This medication may also interact with the following: Alcohol Antiviral medications for HIV or AIDS Aspirin and aspirin-like medications Barbiturates like phenobarbital Certain medications for blood pressure, heart disease, irregular heart beat Certain medications for depression, anxiety, or psychotic disturbances Certain medications for migraine headache like almotriptan, eletriptan, frovatriptan, naratriptan, rizatriptan, sumatriptan, zolmitriptan Certain medications for seizures like carbamazepine and phenytoin Certain medications for sleep Certain medications that treat or prevent blood clots like dalteparin, enoxaparin, warfarin Digoxin Fentanyl Lithium NSAIDS, medications for pain and inflammation, like ibuprofen or naproxen Other medications that prolong the QT interval (cause an abnormal heart rhythm) like dofetilide Rasagiline Supplements like St. John's wort, kava kava, valerian Tramadol Tryptophan This list may not describe all possible interactions. Give your health care provider a list of all the medicines, herbs, non-prescription drugs, or dietary supplements you use. Also tell them if you smoke, drink alcohol, or use illegal drugs. Some items may interact with your medicine. What should I watch for while using this medication? Tell your care team if your symptoms do not get better or if they get worse. Visit your care team for regular checks on your progress. Because it may take several weeks to see the full effects of this medication, it is  important to continue your treatment as prescribed by your care team. Watch for new or worsening thoughts of suicide or depression. This includes sudden changes in mood, behaviors, or thoughts. These changes can happen at any time but are more common in the beginning of treatment or after a change in dose. Call your care team right away if you experience these thoughts or worsening depression. Manic episodes may happen in patients with bipolar disorder who take this medication. Watch for changes in feelings or behaviors such as feeling anxious, nervous, agitated, panicky, irritable, hostile, aggressive, impulsive, severely restless, overly excited and hyperactive, or trouble sleeping. These changes can happen at any time but are more common in the beginning of treatment or after a change in dose. Call your care team right away if you notice any of these symptoms. You may get drowsy or dizzy. Do not drive, use machinery, or do anything that needs mental alertness until you know how this medication affects you. Do not stand or sit up quickly, especially if you are an older patient. This reduces the risk of dizzy or fainting spells. Alcohol may interfere with the effect of this medication. Avoid alcoholic drinks. This medication may cause dry eyes and blurred vision. If you wear contact lenses you may feel some discomfort. Lubricating drops may help. See your eye doctor if the problem does not go away or is severe. Your mouth may get dry. Chewing sugarless gum, sucking hard candy and drinking plenty of water may help. Contact your care team if the problem does not go away or is severe. What side effects may I notice from receiving this medication? Side effects that you should report to your care team as soon as possible: Allergic reactions--skin rash, itching, hives, swelling of the face, lips, tongue, or throat Bleeding--bloody or black, tar-like stools, red or dark brown urine, vomiting blood or brown  material that looks like coffee grounds, small, red or purple spots on skin, unusual bleeding or bruising Heart rhythm changes--fast or irregular heartbeat, dizziness, feeling faint or lightheaded, chest pain, trouble breathing Low blood pressure--dizziness, feeling faint or lightheaded, blurry vision Low sodium level--muscle weakness, fatigue, dizziness, headache, confusion Prolonged or painful erection Serotonin syndrome--irritability, confusion, fast or irregular heartbeat, muscle stiffness, twitching muscles, sweating, high fever, seizures, chills, vomiting, diarrhea Sudden eye pain or change in vision such as blurry vision, seeing halos around lights, vision loss Thoughts of suicide or self-harm, worsening mood, feelings of depression Side effects that usually do not require medical attention (report to your care team if they continue or are bothersome): Change in sex drive or performance Constipation Dizziness Drowsiness Dry mouth This list may not describe all possible side effects. Call your doctor for medical advice about side effects. You may report side effects to FDA at 1-800-FDA-1088. Where should I keep my medication? Keep out of the reach of children and pets. Store at room temperature between 15 and 30 degrees C (59 to 86 degrees F). Protect from light. Keep container tightly closed. Throw away any unused medication after the expiration date. NOTE: This sheet is a summary. It may not cover all possible information. If you have questions about this medicine, talk to your doctor, pharmacist, or health care provider.  2023 [employer]/Gold Standard (2007-06-16 00:00:00)

## 2022-08-31 NOTE — Progress Notes (Unsigned)
BH MD OP Progress Note  08/31/2022 5:32 PM Susan [name]  MRN:  [id number]  Chief Complaint:  Chief Complaint  Patient presents with   Follow-up   Anxiety   Depression   Medication Refill   Insomnia   HPI: Susan [name] is a 62 year old Caucasian female, widowed, unemployed, lives in [place], has a history of bipolar disorder, PTSD, tobacco use disorder, alcohol use disorder in remission, cocaine use disorder in remission, degenerative disk disease, obstructive sleep apnea on CPAP, history of total hip replacement, hepatitis C, osteoarthritis was evaluated in office today.  Patient today appeared to be alert, oriented to person place time and situation.  Was able to answer questions appropriately.  Patient with recent CVA, reviewed notes per Dr.Kc-dated 07/05/2022-patient presented with right sided weakness, patient was provided IV TNK , was admitted to ICU post thrombolytic's.  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  Patient reports she does have good support system from her brother, her son and daughter.  Patient reports initially she felt depressed however she is currently coping better.  She does struggle with her sleep.  Has difficulty falling asleep and sleep is restless throughout the night.  Agreeable to trial of trazodone.  Patient denies any suicidality, homicidality or perceptual disturbances.  Continues to struggle with back pain, currently takes hydrocodone, as needed.  That does seem to help.  Patient denies any other concerns today.   Visit Diagnosis:    ICD-10-CM   1. Bipolar 1 disorder, depressed, full remission  F31.76     2. PTSD (post-traumatic stress disorder)  F43.10     3. Tobacco use disorder  F17.200     4. Alcohol use disorder,  severe, in sustained remission  F10.21     5. Insomnia, unspecified type  G47.00 traZODone (DESYREL) 50 MG tablet      Past Psychiatric History: I have reviewed past psychiatric history from progress note on 09/27/2019.  Past trials of Seroquel, Cymbalta, Abilify, risperidone.  Past Medical History:  Past Medical History:  Diagnosis Date   ADD (attention deficit disorder)    Alcohol abuse    Anemia    Anxiety    Aortic atherosclerosis 02/20/2018   Chest CT Sept 2019   Arthritis    rheumatoid arthritis   Asthma    Bipolar disorder    Constipation    Degenerative disc disease at L5-S1 level 09/28/2016   See ortho note May 2018   Depression    bipolar, hx of suicide attempt   Diabetes mellitus, type 2    Diverticulitis 2013   DOE (dyspnea on exertion)    Drug use    GERD (gastroesophageal reflux disease)    H/O suicide attempt    slit wrists   Hepatitis C 06/26/2014   Hip pain    History of echocardiogram    a. 04/2019 Echo: EF >65%, nl RV fxn.   History of MRSA infection 2013   HLD (hyperlipidemia)    Hypertension    a. 06/2021 Renal Duplex: ? bilat RAS; b. 06/2021 CTA Abd: No signif RAS.   Hyponatremia    Hypothyroidism    Incisional hernia 11/08/2012   Knee pain    Multinodular thyroid    OSA (obstructive sleep apnea)    Osteoporosis  Palpitations    Post-traumatic osteoarthritis of right knee 09/24/2015   Recurrent ventral hernia 11/08/2012   Status post total right knee replacement using cement 05/10/2016   Vitamin B12 deficiency    Vitamin D deficiency disease     Past Surgical History:  Procedure Laterality Date   BILATERAL SALPINGOOPHORECTOMY     due to abnormal mass   BREAST BIOPSY Left    neg   BREAST SURGERY Left 20 yrs ago   CESAREAN SECTION     COLONOSCOPY     COLONOSCOPY WITH PROPOFOL N/A 10/16/2017   Procedure: COLONOSCOPY WITH PROPOFOL;  Surgeon: Wyline Mood, MD;  Location: [facility] ENDOSCOPY;  Service: Gastroenterology;  Laterality: N/A;    COLONOSCOPY WITH PROPOFOL N/A 02/16/2021   Procedure: COLONOSCOPY WITH PROPOFOL;  Surgeon: Wyline Mood, MD;  Location: [facility] ENDOSCOPY;  Service: Gastroenterology;  Laterality: N/A;   HERNIA REPAIR  06/2011, July 2014   Ventral wall repair with Physiomesh   HERNIA REPAIR     2nd.vental wall repair   JOINT REPLACEMENT Right    knee   TONSILLECTOMY     TOTAL HIP ARTHROPLASTY Left 07/23/2019   Procedure: TOTAL HIP ARTHROPLASTY;  Surgeon: Christena Flake, MD;  Location: [facility] ORS;  Service: Orthopedics;  Laterality: Left;   TOTAL KNEE ARTHROPLASTY Right 05/10/2016   Procedure: TOTAL KNEE ARTHROPLASTY;  Surgeon: Christena Flake, MD;  Location: [facility] ORS;  Service: Orthopedics;  Laterality: Right;   TUBAL LIGATION      Family Psychiatric History: I have reviewed family psychiatric history from progress note on 09/27/2019.  Family History:  Family History  Problem Relation Age of Onset   Arthritis Mother    Asthma Mother    Mental illness Mother    Thyroid disease Mother    COPD Mother    Heart disease Mother    Congestive Heart Failure Mother    Alcohol abuse Mother    Eating disorder Mother    Bipolar disorder Mother    Alcohol abuse Father    Heart attack Father    Alcohol abuse Sister    Drug abuse Sister    Mental illness Sister    Mental illness Sister    Fibromyalgia Sister    Obesity Sister    Pneumonia Sister    Depression Sister    Mental illness Sister    Alcohol abuse Sister    Drug abuse Sister    Arthritis Brother    Mental illness Brother    Cancer Brother        non-hodkins lymphoma   Drug abuse Daughter    Drug abuse Son    Alcohol abuse Son    Drug abuse Son    Alcohol abuse Son    Diabetes Neg Hx    Stroke Neg Hx    Breast cancer Neg Hx    Thyroid cancer Neg Hx     Social History: I have reviewed social history from progress note on 09/27/2019. Social History   Socioeconomic History   Marital status: Widowed    Spouse name: Not on file   Number of  children: 3   Years of education: Not on file   Highest education level: Some college, no degree  Occupational History   Occupation: disabled  Tobacco Use   Smoking status: Every Day    Packs/day: 0.50    Years: 41.00    Additional pack years: 0.00    Total pack years: 20.50    Types: Cigarettes   Smokeless tobacco: Never  Vaping Use   Vaping Use: Never used  Substance and Sexual Activity   Alcohol use: No    Alcohol/week: 0.0 standard drinks of 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   Drug use: No    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   Sexual activity: Not Currently  Other Topics Concern   Not on file  Social History Narrative   Not on file   Social Determinants of Health   Financial Resource Strain: Low Risk  (06/02/2021)   Overall Financial Resource Strain (CARDIA)    Difficulty of Paying Living Expenses: Not hard at all  Food Insecurity: No Food Insecurity (07/05/2022)   Hunger Vital Sign    Worried About Running Out of Food in the Last Year: Never true    Ran Out of Food in the Last Year: Never true  Transportation Needs: No Transportation Needs (07/05/2022)   PRAPARE - Administrator, Civil Service (Medical): No    Lack of Transportation (Non-Medical): No  Physical Activity: Sufficiently Active (06/02/2021)   Exercise Vital Sign    Days of Exercise per Week: 5 days    Minutes of Exercise per Session: 30 min  Stress: No Stress Concern Present (06/02/2021)   [employer] of Occupational Health - Occupational Stress Questionnaire    Feeling of Stress : Not at all  Social Connections: Moderately Isolated (06/02/2021)   Social Connection and Isolation Panel [NHANES]    Frequency of Communication with Friends and Family: More than three times a week    Frequency of Social Gatherings with Friends and Family: Three times a week    Attends Religious Services: Never    Active Member of Clubs or Organizations: Yes    Attends Tax inspector Meetings: More than 4 times per year    Marital Status: Widowed    Allergies:  Allergies  Allergen Reactions   Wellbutrin [Bupropion]     Patient reports made " deathly sick " years ago at appointment 03/18/20.   Lasix [Furosemide] Other (See Comments)    Electrolyte imbalance    Metabolic Disorder Labs: Lab Results  Component Value Date   HGBA1C 5.9 06/28/2022   MPG 108 04/08/2019   MPG 103 08/16/2017   No results found for: "PROLACTIN" Lab Results  Component Value Date   CHOL 211 (H) 07/06/2022   TRIG 166 (H) 07/06/2022   HDL 50 07/06/2022   CHOLHDL 4.2 07/06/2022   VLDL 33 07/06/2022   LDLCALC 128 (H) 07/06/2022   LDLCALC 108 (H) 06/28/2022   Lab Results  Component Value Date   TSH 1.15 04/28/2021   TSH 1.09 10/16/2019    Therapeutic Level Labs: No results found for: "LITHIUM" No results found for: "VALPROATE" No results found for: "CBMZ"  Current Medications: Current Outpatient Medications  Medication Sig Dispense Refill   acetaminophen (TYLENOL) 325 MG tablet Take 650 mg by mouth every 6 (six) hours as needed.     albuterol (VENTOLIN HFA) 108 (90 Base) MCG/ACT inhaler Inhale 2 puffs into the lungs every 6 (six) hours as needed for wheezing or shortness of breath. 8 g 2   aspirin EC 81 MG tablet Take 1 tablet (81 mg total) by mouth daily. Swallow whole. 30 tablet 12   carvedilol (COREG) 25 MG tablet TAKE 1 TABLET BY MOUTH TWICE DAILY WITH MEAL. MONITOR HEART RATE 60 tablet 3   clotrimazole (LOTRIMIN) 1 % cream Apply 1 application topically 2 (two) times daily as needed.  cyclobenzaprine (FLEXERIL) 5 MG tablet Take 5 mg by mouth 2 (two) times daily as needed for muscle spasms.      DULoxetine (CYMBALTA) 20 MG capsule TAKE 1 CAPSULE BY MOUTH EVERY DAY WITH 30 MG CAPSULE 30 capsule 3   DULoxetine (CYMBALTA) 30 MG capsule Take 1 capsule (30 mg total) by mouth daily. Take daily with 20 mg cap. 30 capsule 3   gabapentin (NEURONTIN) 300 MG capsule  Take 600 mg by mouth 2 (two) times daily.      glucose blood (ACCU-CHEK GUIDE) test strip TEST UP TO FOUR TIMES A DAY 100 each 0   HYDROcodone-acetaminophen (NORCO/VICODIN) 5-325 MG tablet Take 1 tablet by mouth 3 (three) times daily as needed for moderate pain.     naloxone (NARCAN) nasal spray 4 mg/0.1 mL Place into the nose.     pantoprazole (PROTONIX) 40 MG tablet TAKE 1 TABLET(40 MG) BY MOUTH DAILY 90 tablet 3   rosuvastatin (CRESTOR) 20 MG tablet Take 1 tablet (20 mg total) by mouth daily. 30 tablet 3   Semaglutide,0.25 or 0.5MG /DOS, 2 MG/3ML SOPN Inject 0.25 mg into the skin once a week. 3 mL 1   spironolactone (ALDACTONE) 25 MG tablet Take 1 tablet (25 mg total) by mouth daily. 45 tablet 3   telmisartan (MICARDIS) 80 MG tablet TAKE 1 TABLET(80 MG) BY MOUTH DAILY 90 tablet 1   traZODone (DESYREL) 50 MG tablet Take 1 tablet (50 mg total) by mouth at bedtime. 30 tablet 1   Vitamin D, Ergocalciferol, (DRISDOL) 1.25 MG (50000 UNIT) CAPS capsule NEW PRESCRIPTION REQUEST: TAKE ONE CAPSULE BY MOUTH EVERY WEEK 13 capsule 1   No current facility-administered medications for this visit.     Musculoskeletal: Strength & Muscle Tone:  mild changes , right sided upper and lower extremities due to recent CVA Gait & Station:  Waddling gait Patient leans: N/A  Psychiatric Specialty Exam: Review of Systems  Psychiatric/Behavioral:  Positive for sleep disturbance. The patient is nervous/anxious.   All other systems reviewed and are negative.   Blood pressure (!) 147/81, pulse 66, temperature 98 F (36.7 C), temperature source Skin, height 5\' 2"  (1.575 m), weight 215 lb 3.2 oz (97.6 kg).Body mass index is 39.36 kg/m.  General Appearance: Casual  Eye Contact:  Fair  Speech:  Clear and Coherent  Volume:  Normal  Mood:  Anxious  Affect:  Appropriate  Thought Process:  Goal Directed and Descriptions of Associations: Intact  Orientation:  Full (Time, Place, and Person)  Thought Content: Logical    Suicidal Thoughts:  No  Homicidal Thoughts:  No  Memory:  Immediate;   Fair Recent;   Fair Remote;   Fair  Judgement:  Fair  Insight:  Fair  Psychomotor Activity:  Normal  Concentration:  Concentration: Fair and Attention Span: Fair  Recall:  [employer] of Knowledge: Fair  Language: Fair  Akathisia:  No  Handed:  Right  AIMS (if indicated): not done  Assets:  Communication Skills Desire for Improvement Housing Social Support  ADL's:  Intact  Cognition: WNL  Sleep:  Poor   Screenings: AIMS    Flowsheet Row Video Visit from 03/24/2022 in [facility] Video Visit from 01/20/2022 in [facility] Video Visit from 10/21/2021 in [facility]  AIMS Total Score 0 0 0      GAD-7    Flowsheet Row Office Visit from 08/31/2022 in [facility]  Office Visit from 06/28/2022 in [facility] Video Visit from 01/20/2022 in [facility] Video Visit from 04/22/2021 in [facility] Office Visit from 11/06/2019 in [facility]  Total GAD-7 Score 4 3 1 5 21       PHQ2-9    Flowsheet Row Office Visit from 08/31/2022 in [facility] Office Visit from 06/28/2022 in [facility] Video Visit from 05/25/2022 in [facility] Video Visit from 03/24/2022 in [facility] Video Visit from 01/20/2022 in [facility]  PHQ-2 Total Score 3 1 0 0 0  PHQ-9 Total Score 13 11 -- -- --      Flowsheet Row Office Visit from 08/31/2022 in [facility] ED from 08/02/2022 in [facility] Department [facility] ED from 07/12/2022 in [facility] Emergency Department at [facility]  C-SSRS RISK CATEGORY No Risk No Risk No Risk        Assessment and Plan: Susan [name] is a 62 year old Caucasian female who has a history of bipolar disorder, multiple medical problems was evaluated in office today.  Patient with recent small vessel stroke, currently struggling with sleep.  Patient will benefit from the following plan.  Plan Bipolar disorder type I depressed in remission Cymbalta 50 mg p.o. daily Gabapentin as prescribed for pain which is also a mood stabilizer.  PTSD-stable Cymbalta 50 mg p.o. daily Continue CBT as needed  Insomnia unspecified-unstable Patient to work on sleep hygiene techniques. Start trazodone 50 mg p.o. nightly.  Patient could increase to 100 mg if 50 mg does not work.   Tobacco use disorder-improving Patient is currently trying to quit. Provided information for [facility] - app on mobile device. Provided counseling for 1 minute.  Alcohol use disorder/cocaine use disorder in remission Patient has been sober since the past 5 years.  Continue AA meetings as needed.     Collaboration of Care: Collaboration of Care: Other I have reviewed notes per Dr.Kc dated 07/05/2022 - 07/07/2022-inpatient medical service admission for CVA as noted above.  I have also reviewed notes per Dr. Electa Sniff 08/29/2022-patient with small vessel stroke left frontal and thalamic  Patient/Guardian was advised Release of Information must be obtained prior to any record release in order to collaborate their care with an outside provider. Patient/Guardian was advised if they have not already done so to contact the registration department to sign all necessary forms in order for [name] to release information regarding their care.   Consent: Patient/Guardian gives verbal consent for treatment and assignment of benefits for services provided during this visit.  Patient/Guardian expressed understanding and agreed to proceed.   Follow-up in clinic in 4 to 6 weeks or sooner if needed.  This note was generated in part or whole with voice recognition software. Voice recognition is usually quite accurate but there are transcription errors that can and very often do occur. I apologize for any typographical errors that were not detected and corrected.    Jomarie Longs, MD 08/31/2022, 5:32 PM

## 2022-09-01 ENCOUNTER — Telehealth: Payer: Self-pay | Admitting: Family Medicine

## 2022-09-01 ENCOUNTER — Other Ambulatory Visit: Payer: Self-pay | Admitting: Family Medicine

## 2022-09-01 DIAGNOSIS — I10 Essential (primary) hypertension: Secondary | ICD-10-CM

## 2022-09-01 NOTE — Telephone Encounter (Signed)
Prescription Request  09/01/2022  LOV: 07/22/2022  What is the name of the medication or equipment? spironolactone (ALDACTONE) 25 MG tablet  Have you contacted your pharmacy to request a refill? Yes   Which pharmacy would you like this sent to?    [facility] - [place], [place] - [address] Laurell Josephs [address] Laurell Josephs [address] [place] [address] Phone: [phone] Fax: [phone]    Patient notified that their request is being sent to the clinical staff for review and that they should receive a response within 2 business days.   Please advise at Mobile [phone] (mobile)   As per pt, they prescribed her this med when she went to the ED, however they increase it to a whole pill. As per pt, she has more refills but they ready until May 13th, and she can't wait until then.

## 2022-09-02 MED ORDER — SPIRONOLACTONE 25 MG PO TABS
25.0000 mg | ORAL_TABLET | Freq: Every day | ORAL | 0 refills | Status: DC
Start: 2022-09-02 — End: 2022-09-07

## 2022-09-02 NOTE — Telephone Encounter (Signed)
Verbal order given by Dr. Birdie Sons to fill the Spironolactone and schedule her for a bmet next week. I spoke with Susan [name] and advised her. Susan [name] is scheduled for lab and lab has been ordered.

## 2022-09-06 ENCOUNTER — Other Ambulatory Visit: Payer: 59

## 2022-09-06 NOTE — Telephone Encounter (Signed)
Error

## 2022-09-07 ENCOUNTER — Telehealth: Payer: Self-pay | Admitting: Family Medicine

## 2022-09-07 DIAGNOSIS — I10 Essential (primary) hypertension: Secondary | ICD-10-CM

## 2022-09-07 MED ORDER — SPIRONOLACTONE 25 MG PO TABS: 25.0000 mg | ORAL_TABLET | Freq: Every day | ORAL | 1 refills | Status: DC

## 2022-09-07 NOTE — Telephone Encounter (Signed)
Sent to pharmacy 

## 2022-09-07 NOTE — Telephone Encounter (Signed)
Prescription Request  09/07/2022  LOV: 07/22/2022  What is the name of the medication or equipment? spironolactone (ALDACTONE) 25 MG table. The directions are wrong , ED doctor changed to 1 pill per day. Patient will not have enough pills for a 30 or 90 day supply. Patient called [facility] and was told that her provider would have to change prescription.   Have you contacted your pharmacy to request a refill? No   Which pharmacy would you like this sent to?   [facility] - [place], [place] - [address] Laurell Josephs [address] Laurell Josephs [address] [place] [address] Phone: [phone] Fax: [phone]    Patient notified that their request is being sent to the clinical staff for review and that they should receive a response within 2 business days.   Please advise at [facility] [phone]

## 2022-09-09 ENCOUNTER — Telehealth: Payer: Self-pay | Admitting: Family Medicine

## 2022-09-09 NOTE — Telephone Encounter (Signed)
Copied from CRM [id number]. Topic: Medicare AWV >> Sep 09, 2022 11:18 AM Payton Doughty wrote: Reason for CRM: Called patient to schedule Medicare Annual Wellness Visit (AWV). Left message for patient to call back and schedule Medicare Annual Wellness Visit (AWV).  Last date of AWV: 06/02/21  Please schedule an appointment at any time with [facility].  If any questions, please contact me.  Thank you ,  Verlee Rossetti; Care Guide Ambulatory Clinical Support [place] [facility] Direct Dial: [phone]

## 2022-09-14 ENCOUNTER — Other Ambulatory Visit (INDEPENDENT_AMBULATORY_CARE_PROVIDER_SITE_OTHER): Payer: 59

## 2022-09-14 DIAGNOSIS — I10 Essential (primary) hypertension: Secondary | ICD-10-CM

## 2022-09-14 DIAGNOSIS — E559 Vitamin D deficiency, unspecified: Secondary | ICD-10-CM

## 2022-09-14 LAB — BASIC METABOLIC PANEL
BUN: 12 mg/dL (ref 6–23)
CO2: 25 mEq/L (ref 19–32)
Calcium: 9.1 mg/dL (ref 8.4–10.5)
Chloride: 100 mEq/L (ref 96–112)
Creatinine, Ser: 1.09 mg/dL (ref 0.40–1.20)
GFR: 54.78 mL/min — ABNORMAL LOW (ref >60.00)
Glucose, Bld: 81 mg/dL (ref 70–99)
Potassium: 4.1 mEq/L (ref 3.5–5.1)
Sodium: 135 mEq/L (ref 135–145)

## 2022-09-14 LAB — VITAMIN D 25 HYDROXY (VIT D DEFICIENCY, FRACTURES): VITD: 31.62 ng/mL (ref 30.00–100.00)

## 2022-09-15 ENCOUNTER — Telehealth: Payer: Self-pay

## 2022-09-15 ENCOUNTER — Telehealth: Payer: Self-pay | Admitting: Family Medicine

## 2022-09-15 NOTE — Telephone Encounter (Signed)
Left message to call the office back regarding results 

## 2022-09-15 NOTE — Telephone Encounter (Signed)
Prescription Request error 

## 2022-09-15 NOTE — Telephone Encounter (Signed)
-----   Message from Glori Luis, MD sent at 09/14/2022  4:29 PM EDT ----- Please let the patient know that her vitamin D is now acceptable.  She should continue with her current vitamin D supplementation.  Her kidney function is a little worse than her baseline though it is acceptable given the addition of the spironolactone.  She needs to keep her appointment with me later this month.

## 2022-09-20 ENCOUNTER — Ambulatory Visit (INDEPENDENT_AMBULATORY_CARE_PROVIDER_SITE_OTHER): Payer: 59 | Admitting: Neurosurgery

## 2022-09-20 ENCOUNTER — Other Ambulatory Visit: Payer: Self-pay

## 2022-09-20 ENCOUNTER — Encounter: Payer: Self-pay | Admitting: Neurosurgery

## 2022-09-20 VITALS — BP 140/83 | Ht 62.0 in | Wt 215.0 lb

## 2022-09-20 DIAGNOSIS — M419 Scoliosis, unspecified: Secondary | ICD-10-CM | POA: Diagnosis not present

## 2022-09-20 DIAGNOSIS — Z01818 Encounter for other preprocedural examination: Secondary | ICD-10-CM

## 2022-09-20 DIAGNOSIS — M5416 Radiculopathy, lumbar region: Secondary | ICD-10-CM

## 2022-09-20 DIAGNOSIS — M7138 Other bursal cyst, other site: Secondary | ICD-10-CM

## 2022-09-20 NOTE — H&P (View-Only) (Signed)
  Referring Physician:  No referring provider defined for this encounter.  Primary Physician:  Sonnenberg, Eric G, MD  History of Present Illness: 09/20/2022 [name] is doing about the same as she was when I previously saw her.  She went to physical therapy last month but it made her symptoms worse.  She continues to have pain down her right leg when she stands or walks.  06/02/2022 [name] is here today with a chief complaint of several years of slowly worsening back pain.  Her most pertinent issue today is pain down her right leg prickly when she stands or walks.  She has pain to her lateral thigh and lateral calf.  Her pain is around her ankle.  It is very bad when she stands in 1 position.  It also limits her ability to walk significant distances.  Standing, walking, doing chores, stairs, and physical activity make it worse.  Medications help.  She is tried injections in the past but they are no longer working.  She saw physical therapy in September which helped somewhat.  Bowel/Bladder Dysfunction: none  Conservative measures:  Physical therapy:  has participated in at [facility] from 01/17/22 to 01/31/22 Multimodal medical therapy including regular antiinflammatories:  aleve, celebrex, mobic, gabapentin, tramadol, cymbalta, hydrocodone, tylenol Injections:  has received epidural steroid injections 03/11/2022: Right hip joint injection (moderate relief) 01/11/2022: Right L3-4 and right L5-S1 transforaminal ESI (over 60% relief) 06/07/2021: Right trochanteric bursa injection (moderate relief) 02/09/2021: Right L3-4 and right L5-S1 transforaminal ESI (moderate to good relief) 01/06/2021: Right L4-5 and right L5-S1 transforaminal ESI (good relief x 3-4 days) 12/24/2020: Right L4 trigger point injection (mild relief) 07/14/2020: RFA to the bilateral L4-5 and L5-S1 facet joints (90% relief) 06/26/2020: MBB to the bilateral L4-5 and L5-S1 facet joints (7/10 to  0/10) 06/02/2020: MBB to the bilateral L4-5 and L5-S1 facet joints (10/10 to 0/10) 02/17/2020: Right L3 trigger point injection (moderate to good relief) 07/23/2019: Left hip replacement (Dr. Poggi)  05/22/2019: Left L2-3 and left L4-5 transforaminal ESI (helped L4 pain, persistenet hip joint pain) 05/13/2019: Left hip joint injection under ultrasound (mild to moderate relief x 3 days) 02/28/2019: Left hip joint injection under ultrasound (moderate relief) 12/10/2018: Right subacromial injection (80% relief) 05/24/2018: Left hip joint injection under ultrasound (good relief) 03/12/2018: Left L2-3 transforaminal ESI (no relief) 02/01/2018: Left L5-S1 transforaminal ESI (relief x5 days) 12/05/2017: Bilateral L5-S1 transforaminal ESI (no relief) 09/13/2017: Right L3 trigger point injection (moderate relief) 02/15/2017: Bilateral L5-S1 transforaminal ESI (good relief for 2 weeks, then moderate sustained relief) 09/23/2016: Bilateral L4-5 transforaminal ESI (mild to moderate relief) 12/17/2015: Bilateral L4-5 transforaminal ESI (good relief) 05/26/2015: Left L4-5 transforaminal ESI (good relief of left leg pain) 04/22/2015: Bilateral L5-S1 transforaminal ESI (relief of right leg pain, worsening of left leg pain but then in an L4 distribution) 08/27/2014: Right L4-5 transforaminal ESI (resolution of right L4 radicular symptoms) 07/16/2014: Right L3-4 transforaminal ESI (resolution of right L3 radicular symptoms) 10/25/2013: Left L4-5 transforaminal ESI (90% relief) 09/04/2013: Left L4-5 transforaminal ESI   Past Surgery: denies  Meranda L Sandell has no symptoms of cervical myelopathy.  The symptoms are causing a significant impact on the patient's life.   I have utilized the care everywhere function in epic to review the outside records available from external health systems.  Review of Systems:  A 10 point review of systems is negative, except for the pertinent positives and negatives  detailed in the HPI.  Past Medical History: Past   Medical History:  Diagnosis Date   ADD (attention deficit disorder)    Alcohol abuse    Anemia    Anxiety    Aortic atherosclerosis (HCC) 02/20/2018   Chest CT Sept 2019   Arthritis    rheumatoid arthritis   Asthma    Bipolar disorder (HCC)    Constipation    Degenerative disc disease at L5-S1 level 09/28/2016   See ortho note May 2018   Depression    bipolar, hx of suicide attempt   Diabetes mellitus, type 2 (HCC)    Diverticulitis 2013   DOE (dyspnea on exertion)    Drug use    GERD (gastroesophageal reflux disease)    H/O suicide attempt    slit wrists   Hepatitis C 06/26/2014   Hip pain    History of echocardiogram    a. 04/2019 Echo: EF >65%, nl RV fxn.   History of MRSA infection 2013   HLD (hyperlipidemia)    Hypertension    a. 06/2021 Renal Duplex: ? bilat RAS; b. 06/2021 CTA Abd: No signif RAS.   Hyponatremia    Hypothyroidism    Incisional hernia 11/08/2012   Knee pain    Multinodular thyroid    OSA (obstructive sleep apnea)    Osteoporosis    Palpitations    Post-traumatic osteoarthritis of right knee 09/24/2015   Recurrent ventral hernia 11/08/2012   Status post total right knee replacement using cement 05/10/2016   Vitamin B12 deficiency    Vitamin D deficiency disease     Past Surgical History: Past Surgical History:  Procedure Laterality Date   BILATERAL SALPINGOOPHORECTOMY     due to abnormal mass   BREAST BIOPSY Left    neg   BREAST SURGERY Left 20 yrs ago   CESAREAN SECTION     COLONOSCOPY     COLONOSCOPY WITH PROPOFOL N/A 10/16/2017   Procedure: COLONOSCOPY WITH PROPOFOL;  Surgeon: Anna, Kiran, MD;  Location: [facility] ENDOSCOPY;  Service: Gastroenterology;  Laterality: N/A;   COLONOSCOPY WITH PROPOFOL N/A 02/16/2021   Procedure: COLONOSCOPY WITH PROPOFOL;  Surgeon: Anna, Kiran, MD;  Location: [facility] ENDOSCOPY;  Service: Gastroenterology;  Laterality: N/A;   HERNIA REPAIR  06/2011, July 2014    Ventral wall repair with Physiomesh   HERNIA REPAIR     2nd.vental wall repair   JOINT REPLACEMENT Right    knee   TONSILLECTOMY     TOTAL HIP ARTHROPLASTY Left 07/23/2019   Procedure: TOTAL HIP ARTHROPLASTY;  Surgeon: Poggi, John J, MD;  Location: [facility] ORS;  Service: Orthopedics;  Laterality: Left;   TOTAL KNEE ARTHROPLASTY Right 05/10/2016   Procedure: TOTAL KNEE ARTHROPLASTY;  Surgeon: John J Poggi, MD;  Location: [facility] ORS;  Service: Orthopedics;  Laterality: Right;   TUBAL LIGATION      Allergies: Allergies as of 09/20/2022 - Review Complete 08/31/2022  Allergen Reaction Noted   Wellbutrin [bupropion]  03/18/2020   Lasix [furosemide] Other (See Comments) 11/08/2012    Medications: No outpatient medications have been marked as taking for the 09/20/22 encounter (Appointment) with Fiana Gladu, MD.    Social History: Social History   Tobacco Use   Smoking status: Every Day    Packs/day: 0.50    Years: 41.00    Additional pack years: 0.00    Total pack years: 20.50    Types: Cigarettes   Smokeless tobacco: Never  Vaping Use   Vaping Use: Never used  Substance Use Topics   Alcohol use: No    Alcohol/week: 0.0   standard drinks of 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   Drug use: No    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    Family Medical History: Family History  Problem Relation Age of Onset   Arthritis Mother    Asthma Mother    Mental illness Mother    Thyroid disease Mother    COPD Mother    Heart disease Mother    Congestive Heart Failure Mother    Alcohol abuse Mother    Eating disorder Mother    Bipolar disorder Mother    Alcohol abuse Father    Heart attack Father    Alcohol abuse Sister    Drug abuse Sister    Mental illness Sister    Mental illness Sister    Fibromyalgia Sister    Obesity Sister    Pneumonia Sister    Depression Sister    Mental illness Sister    Alcohol abuse Sister    Drug abuse Sister     Arthritis Brother    Mental illness Brother    Cancer Brother        non-hodkins lymphoma   Drug abuse Daughter    Drug abuse Son    Alcohol abuse Son    Drug abuse Son    Alcohol abuse Son    Diabetes Neg Hx    Stroke Neg Hx    Breast cancer Neg Hx    Thyroid cancer Neg Hx     Physical Examination: There were no vitals filed for this visit.   General: Patient is well developed, well nourished, calm, collected, and in no apparent distress. Attention to examination is appropriate.  Neck:   Supple.  Full range of motion.  Respiratory: Patient is breathing without any difficulty.   NEUROLOGICAL:     Awake, alert, oriented to person, place, and time.  Speech is clear and fluent.   Cranial Nerves: Pupils equal round and reactive to light.  Facial tone is symmetric.  Facial sensation is symmetric. Shoulder shrug is symmetric. Tongue protrusion is midline.  There is no pronator drift.   Strength: Side Biceps Triceps Deltoid Interossei Grip Wrist Ext. Wrist Flex.  R 5 5 5 5 5 5 5  L 5 5 5 5 5 5 5   Side Iliopsoas Quads Hamstring PF DF EHL  R 5 5 5 5 5 5  L 5 5 5 5 5 5   Reflexes are 1+ and symmetric at the biceps, triceps, brachioradialis, patella and achilles.   Hoffman's is absent.   Bilateral upper and lower extremity sensation is intact to light touch.    No evidence of dysmetria noted.  Gait is antalgic.    +SLR on R.   Medical Decision Making  Imaging: MRI L spine 05/18/2022 IMPRESSION: 1. Progressive advanced bilateral facet hypertrophy at L4-5 with a new right-sided facet effusion. 2. A heterogeneous lesion arising from the right facet at L4-5 extends superiorly 18 mm and likely represents a synovial cyst contributing to moderate to severe central canal and right lateral recess narrowing. I 3. Progressive moderate bilateral foraminal stenosis at L4-5. 4. Progressive advanced bilateral facet hypertrophy at L5-S1, right greater than left. 5. Mild right  foraminal stenosis at L5-S1. 6. Mild left subarticular and foraminal narrowing at L1-2 and L2-3 is stable. 7. Mild foraminal narrowing bilaterally at L3-4.     Electronically Signed   By: Christopher  Mattern M.D.   On: 05/18/2022 10:15    I   have personally reviewed the images and agree with the above interpretation.  Assessment and Plan: Ms. Postema is a pleasant 61 y.o. female with acquired scoliosis over the past several years as apparent on her CT scans from 2013 and 2023.  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  This is consistent with her symptoms.  She has lumbar radiculopathy consistent with L5 distribution.  She has gone back to physical therapy but had worsening of her symptoms.  She underwent physical therapy in September and again in April.  I do not think that further conservative management is indicated at this point.  I have recommended right L4-5 laminoforaminotomy for synovial cyst resection.  I discussed the planned procedure at length with the patient, including the risks, benefits, alternatives, and indications. The risks discussed include but are not limited to bleeding, infection, need for reoperation, spinal fluid leak, stroke, vision loss, anesthetic complication, coma, paralysis, and even death. I also described in detail that improvement was not guaranteed.  The patient expressed understanding of these risks, and asked that we proceed with surgery. I described the surgery in layman's terms, and gave ample opportunity for questions, which were answered to the best of my ability.  I spent a total of 10 minutes in this patient's care today. This time was spent reviewing pertinent records including imaging studies, obtaining and confirming history, performing a directed evaluation,  formulating and discussing my recommendations, and documenting the visit within the medical record.     Thank you for involving me in the care of this patient.      Toya Palacios K. Gracyn Santillanes MD, MPHS Neurosurgery 

## 2022-09-20 NOTE — Progress Notes (Signed)
Referring Physician:  No referring provider defined for this encounter.  Primary Physician:  Glori Luis, MD  History of Present Illness: 09/20/2022 Susan [name] is doing about the same as she was when I previously saw her.  She went to physical therapy last month but it made her symptoms worse.  She continues to have pain down her right leg when she stands or walks.  06/02/2022 Susan [name] is here today with a chief complaint of several years of slowly worsening back pain.  Her most pertinent issue today is pain down her right leg prickly when she stands or walks.  She has pain to her lateral thigh and lateral calf.  Her pain is around her ankle.  It is very bad when she stands in 1 position.  It also limits her ability to walk significant distances.  Standing, walking, doing chores, stairs, and physical activity make it worse.  Medications help.  She is tried injections in the past but they are no longer working.  She saw physical therapy in September which helped somewhat.  Bowel/Bladder Dysfunction: none  Conservative measures:  Physical therapy:  has participated in at [facility] from 01/17/22 to 01/31/22 Multimodal medical therapy including regular antiinflammatories:  aleve, celebrex, mobic, gabapentin, tramadol, cymbalta, hydrocodone, tylenol Injections:  has received epidural steroid injections 03/11/2022: Right hip joint injection (moderate relief) 01/11/2022: Right L3-4 and right L5-S1 transforaminal ESI (over 60% relief) 06/07/2021: Right trochanteric bursa injection (moderate relief) 02/09/2021: Right L3-4 and right L5-S1 transforaminal ESI (moderate to good relief) 01/06/2021: Right L4-5 and right L5-S1 transforaminal ESI (good relief x 3-4 days) 12/24/2020: Right L4 trigger point injection (mild relief) 07/14/2020: RFA to the bilateral L4-5 and L5-S1 facet joints (90% relief) 06/26/2020: MBB to the bilateral L4-5 and L5-S1 facet joints (7/10 to  0/10) 06/02/2020: MBB to the bilateral L4-5 and L5-S1 facet joints (10/10 to 0/10) 02/17/2020: Right L3 trigger point injection (moderate to good relief) 07/23/2019: Left hip replacement (Dr. Joice Lofts)  05/22/2019: Left L2-3 and left L4-5 transforaminal ESI (helped L4 pain, persistenet hip joint pain) 05/13/2019: Left hip joint injection under ultrasound (mild to moderate relief x 3 days) 02/28/2019: Left hip joint injection under ultrasound (moderate relief) 12/10/2018: Right subacromial injection (80% relief) 05/24/2018: Left hip joint injection under ultrasound (good relief) 03/12/2018: Left L2-3 transforaminal ESI (no relief) 02/01/2018: Left L5-S1 transforaminal ESI (relief x5 days) 12/05/2017: Bilateral L5-S1 transforaminal ESI (no relief) 09/13/2017: Right L3 trigger point injection (moderate relief) 02/15/2017: Bilateral L5-S1 transforaminal ESI (good relief for 2 weeks, then moderate sustained relief) 09/23/2016: Bilateral L4-5 transforaminal ESI (mild to moderate relief) 12/17/2015: Bilateral L4-5 transforaminal ESI (good relief) 05/26/2015: Left L4-5 transforaminal ESI (good relief of left leg pain) 04/22/2015: Bilateral L5-S1 transforaminal ESI (relief of right leg pain, worsening of left leg pain but then in an L4 distribution) 08/27/2014: Right L4-5 transforaminal ESI (resolution of right L4 radicular symptoms) 07/16/2014: Right L3-4 transforaminal ESI (resolution of right L3 radicular symptoms) 10/25/2013: Left L4-5 transforaminal ESI (90% relief) 09/04/2013: Left L4-5 transforaminal ESI   Past Surgery: denies  Susan [name] has no symptoms of cervical myelopathy.  The symptoms are causing a significant impact on the patient's life.   I have utilized the care everywhere function in epic to review the outside records available from external health systems.  Review of Systems:  A 10 point review of systems is negative, except for the pertinent positives and negatives  detailed in the HPI.  Past Medical History: Past  Medical History:  Diagnosis Date   ADD (attention deficit disorder)    Alcohol abuse    Anemia    Anxiety    Aortic atherosclerosis (HCC) 02/20/2018   Chest CT Sept 2019   Arthritis    rheumatoid arthritis   Asthma    Bipolar disorder (HCC)    Constipation    Degenerative disc disease at L5-S1 level 09/28/2016   See ortho note May 2018   Depression    bipolar, hx of suicide attempt   Diabetes mellitus, type 2 (HCC)    Diverticulitis 2013   DOE (dyspnea on exertion)    Drug use    GERD (gastroesophageal reflux disease)    H/O suicide attempt    slit wrists   Hepatitis C 06/26/2014   Hip pain    History of echocardiogram    a. 04/2019 Echo: EF >65%, nl RV fxn.   History of MRSA infection 2013   HLD (hyperlipidemia)    Hypertension    a. 06/2021 Renal Duplex: ? bilat RAS; b. 06/2021 CTA Abd: No signif RAS.   Hyponatremia    Hypothyroidism    Incisional hernia 11/08/2012   Knee pain    Multinodular thyroid    OSA (obstructive sleep apnea)    Osteoporosis    Palpitations    Post-traumatic osteoarthritis of right knee 09/24/2015   Recurrent ventral hernia 11/08/2012   Status post total right knee replacement using cement 05/10/2016   Vitamin B12 deficiency    Vitamin D deficiency disease     Past Surgical History: Past Surgical History:  Procedure Laterality Date   BILATERAL SALPINGOOPHORECTOMY     due to abnormal mass   BREAST BIOPSY Left    neg   BREAST SURGERY Left 20 yrs ago   CESAREAN SECTION     COLONOSCOPY     COLONOSCOPY WITH PROPOFOL N/A 10/16/2017   Procedure: COLONOSCOPY WITH PROPOFOL;  Surgeon: Wyline Mood, MD;  Location: [facility] ENDOSCOPY;  Service: Gastroenterology;  Laterality: N/A;   COLONOSCOPY WITH PROPOFOL N/A 02/16/2021   Procedure: COLONOSCOPY WITH PROPOFOL;  Surgeon: Wyline Mood, MD;  Location: [facility] ENDOSCOPY;  Service: Gastroenterology;  Laterality: N/A;   HERNIA REPAIR  06/2011, July 2014    Ventral wall repair with Physiomesh   HERNIA REPAIR     2nd.vental wall repair   JOINT REPLACEMENT Right    knee   TONSILLECTOMY     TOTAL HIP ARTHROPLASTY Left 07/23/2019   Procedure: TOTAL HIP ARTHROPLASTY;  Surgeon: Christena Flake, MD;  Location: [facility] ORS;  Service: Orthopedics;  Laterality: Left;   TOTAL KNEE ARTHROPLASTY Right 05/10/2016   Procedure: TOTAL KNEE ARTHROPLASTY;  Surgeon: Christena Flake, MD;  Location: [facility] ORS;  Service: Orthopedics;  Laterality: Right;   TUBAL LIGATION      Allergies: Allergies as of 09/20/2022 - Review Complete 08/31/2022  Allergen Reaction Noted   Wellbutrin [bupropion]  03/18/2020   Lasix [furosemide] Other (See Comments) 11/08/2012    Medications: No outpatient medications have been marked as taking for the 09/20/22 encounter (Appointment) with Venetia Night, MD.    Social History: Social History   Tobacco Use   Smoking status: Every Day    Packs/day: 0.50    Years: 41.00    Additional pack years: 0.00    Total pack years: 20.50    Types: Cigarettes   Smokeless tobacco: Never  Vaping Use   Vaping Use: Never used  Substance Use Topics   Alcohol use: No    Alcohol/week: 0.0  standard drinks of 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   Drug use: No    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    Family Medical History: Family History  Problem Relation Age of Onset   Arthritis Mother    Asthma Mother    Mental illness Mother    Thyroid disease Mother    COPD Mother    Heart disease Mother    Congestive Heart Failure Mother    Alcohol abuse Mother    Eating disorder Mother    Bipolar disorder Mother    Alcohol abuse Father    Heart attack Father    Alcohol abuse Sister    Drug abuse Sister    Mental illness Sister    Mental illness Sister    Fibromyalgia Sister    Obesity Sister    Pneumonia Sister    Depression Sister    Mental illness Sister    Alcohol abuse Sister    Drug abuse Sister     Arthritis Brother    Mental illness Brother    Cancer Brother        non-hodkins lymphoma   Drug abuse Daughter    Drug abuse Son    Alcohol abuse Son    Drug abuse Son    Alcohol abuse Son    Diabetes Neg Hx    Stroke Neg Hx    Breast cancer Neg Hx    Thyroid cancer Neg Hx     Physical Examination: There were no vitals filed for this visit.   General: Patient is well developed, well nourished, calm, collected, and in no apparent distress. Attention to examination is appropriate.  Neck:   Supple.  Full range of motion.  Respiratory: Patient is breathing without any difficulty.   NEUROLOGICAL:     Awake, alert, oriented to person, place, and time.  Speech is clear and fluent.   Cranial Nerves: Pupils equal round and reactive to light.  Facial tone is symmetric.  Facial sensation is symmetric. Shoulder shrug is symmetric. Tongue protrusion is midline.  There is no pronator drift.   Strength: Side Biceps Triceps Deltoid Interossei Grip Wrist Ext. Wrist Flex.  R 5 5 5 5 5 5 5   L 5 5 5 5 5 5 5    Side Iliopsoas Quads Hamstring PF DF EHL  R 5 5 5 5 5 5   L 5 5 5 5 5 5    Reflexes are 1+ and symmetric at the biceps, triceps, brachioradialis, patella and achilles.   Hoffman's is absent.   Bilateral upper and lower extremity sensation is intact to light touch.    No evidence of dysmetria noted.  Gait is antalgic.    +SLR on R.   Medical Decision Making  Imaging: MRI L spine 05/18/2022 IMPRESSION: 1. Progressive advanced bilateral facet hypertrophy at L4-5 with a new right-sided facet effusion. 2. A heterogeneous lesion arising from the right facet at L4-5 extends superiorly 18 mm and likely represents a synovial cyst contributing to moderate to severe central canal and right lateral recess narrowing. I 3. Progressive moderate bilateral foraminal stenosis at L4-5. 4. Progressive advanced bilateral facet hypertrophy at L5-S1, right greater than left. 5. Mild right  foraminal stenosis at L5-S1. 6. Mild left subarticular and foraminal narrowing at L1-2 and L2-3 is stable. 7. Mild foraminal narrowing bilaterally at L3-4.     Electronically Signed   By: Marin Roberts M.D.   On: 05/18/2022 10:15    I  have personally reviewed the images and agree with the above interpretation.  Assessment and Plan: Ms. Ihnen is a pleasant 62 y.o. female with acquired scoliosis over the past several years as apparent on her CT scans from 2013 and 2023.  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  This is consistent with her symptoms.  She has lumbar radiculopathy consistent with L5 distribution.  She has gone back to physical therapy but had worsening of her symptoms.  She underwent physical therapy in September and again in April.  I do not think that further conservative management is indicated at this point.  I have recommended right L4-5 laminoforaminotomy for synovial cyst resection.  I discussed the planned procedure at length with the patient, including the risks, benefits, alternatives, and indications. The risks discussed include but are not limited to bleeding, infection, need for reoperation, spinal fluid leak, stroke, vision loss, anesthetic complication, coma, paralysis, and even death. I also described in detail that improvement was not guaranteed.  The patient expressed understanding of these risks, and asked that we proceed with surgery. I described the surgery in layman's terms, and gave ample opportunity for questions, which were answered to the best of my ability.  I spent a total of 10 minutes in this patient's care today. This time was spent reviewing pertinent records including imaging studies, obtaining and confirming history, performing a directed evaluation,  formulating and discussing my recommendations, and documenting the visit within the medical record.     Thank you for involving me in the care of this patient.      Zyah Gomm K. Myer Haff MD, [facility] Neurosurgery

## 2022-09-20 NOTE — Patient Instructions (Signed)
Please see below for information in regards to your upcoming surgery:   Planned surgery: Right L4-5 laminoforaminotomy   Surgery date: 10/10/22 - you will find out your arrival time the business day before your surgery.   Pre-op appointment at [facility] Pre-admit Testing: we will call you with a date/time for this. Pre-admit testing is located on the first floor of the [facility], [address] [facility] [address]. Please bring all prescriptions in the original prescription bottles to your appointment, even if you have reviewed medications by phone with a pharmacy representative. Pre-op labs may be done at your pre-op appointment. You are not required to fast for these labs. Should you need to change your pre-op appointment, please call Pre-admit testing at [phone].    Surgical clearance: we will send a clearance request to Dr Birdie Sons   Blood thinners: ok to stay on aspirin 81mg     Ozempic: hold for 7 days prior to surgery    We can be reached by phone or mychart 8am-4pm, Monday-Friday. If you have any questions/concerns before or after surgery, you can reach [name] at [phone], or you can send a mychart message. If you have a concern after hours that cannot wait until normal business hours, you can call [phone] and ask to page the neurosurgeon on call for [place].   Appointments/FMLA & disability paperwork: [employer]ty [employer] Cristin  Nurse: Royston Cowper  Medical assistants: Laurann Montana Physician Assistant's: Manning Charity & Drake Leach Surgeon: Venetia Night, MD

## 2022-09-20 NOTE — Addendum Note (Signed)
Addended by: Sharlot Gowda on: 09/20/2022 12:05 PM   Modules accepted: Orders

## 2022-09-22 ENCOUNTER — Ambulatory Visit (INDEPENDENT_AMBULATORY_CARE_PROVIDER_SITE_OTHER): Payer: 59 | Admitting: Dermatology

## 2022-09-22 VITALS — BP 186/88

## 2022-09-22 DIAGNOSIS — L814 Other melanin hyperpigmentation: Secondary | ICD-10-CM | POA: Diagnosis not present

## 2022-09-22 DIAGNOSIS — L578 Other skin changes due to chronic exposure to nonionizing radiation: Secondary | ICD-10-CM | POA: Diagnosis not present

## 2022-09-22 DIAGNOSIS — D692 Other nonthrombocytopenic purpura: Secondary | ICD-10-CM

## 2022-09-22 DIAGNOSIS — X32XXXA Exposure to sunlight, initial encounter: Secondary | ICD-10-CM | POA: Diagnosis not present

## 2022-09-22 DIAGNOSIS — W908XXA Exposure to other nonionizing radiation, initial encounter: Secondary | ICD-10-CM | POA: Diagnosis not present

## 2022-09-22 DIAGNOSIS — L820 Inflamed seborrheic keratosis: Secondary | ICD-10-CM

## 2022-09-22 DIAGNOSIS — L821 Other seborrheic keratosis: Secondary | ICD-10-CM

## 2022-09-22 NOTE — Patient Instructions (Signed)
Cryotherapy Aftercare  Wash gently with soap and water everyday.   Apply Vaseline and Band-Aid daily until healed.     Due to recent changes in healthcare laws, you may see results of your pathology and/or laboratory studies on MyChart before the doctors have had a chance to review them. We understand that in some cases there may be results that are confusing or concerning to you. Please understand that not all results are received at the same time and often the doctors may need to interpret multiple results in order to provide you with the best plan of care or course of treatment. Therefore, we ask that you please give us 2 business days to thoroughly review all your results before contacting the office for clarification. Should we see a critical lab result, you will be contacted sooner.   If You Need Anything After Your Visit  If you have any questions or concerns for your doctor, please call our main line at [phone] and press option 4 to reach your doctor's medical assistant. If no one answers, please leave a voicemail as directed and we will return your call as soon as possible. Messages left after 4 pm will be answered the following business day.   You may also send us a message via MyChart. We typically respond to MyChart messages within 1-2 business days.  For prescription refills, please ask your pharmacy to contact our office. Our fax number is [phone].  If you have an urgent issue when the clinic is closed that cannot wait until the next business day, you can page your doctor at the number below.    Please note that while we do our best to be available for urgent issues outside of office hours, we are not available 24/7.   If you have an urgent issue and are unable to reach us, you may choose to seek medical care at your doctor's office, retail clinic, urgent care center, or emergency room.  If you have a medical emergency, please immediately call 911 or go to the  emergency department.  Pager Numbers  - Dr. Kowalski: [phone]  - Dr. Moye: [phone]  - Dr. Stewart: [phone]  In the event of inclement weather, please call our main line at [phone] for an update on the status of any delays or closures.  Dermatology Medication Tips: Please keep the boxes that topical medications come in in order to help keep track of the instructions about where and how to use these. Pharmacies typically print the medication instructions only on the boxes and not directly on the medication tubes.   If your medication is too expensive, please contact our office at [phone] option 4 or send us a message through MyChart.   We are unable to tell what your co-pay for medications will be in advance as this is different depending on your insurance coverage. However, we may be able to find a substitute medication at lower cost or fill out paperwork to get insurance to cover a needed medication.   If a prior authorization is required to get your medication covered by your insurance company, please allow us 1-2 business days to complete this process.  Drug prices often vary depending on where the prescription is filled and some pharmacies may offer cheaper prices.  The website www.goodrx.com contains coupons for medications through different pharmacies. The prices here do not account for what the cost may be with help from insurance (it may be cheaper with your insurance), but the website can   give you the price if you did not use any insurance.  - You can print the associated coupon and take it with your prescription to the pharmacy.  - You may also stop by our office during regular business hours and pick up a GoodRx coupon card.  - If you need your prescription sent electronically to a different pharmacy, notify our office through [place] MyChart or by phone at [phone] option 4.     Si Usted Necesita Algo Despus de Su Visita  Tambin puede  enviarnos un mensaje a travs de MyChart. Por lo general respondemos a los mensajes de MyChart en el transcurso de 1 a 2 das hbiles.  Para renovar recetas, por favor pida a su farmacia que se ponga en contacto con nuestra oficina. Nuestro nmero de fax es el [phone].  Si tiene un asunto urgente cuando la clnica est cerrada y que no puede esperar hasta el siguiente da hbil, puede llamar/localizar a su doctor(a) al nmero que aparece a continuacin.   Por favor, tenga en cuenta que aunque hacemos todo lo posible para estar disponibles para asuntos urgentes fuera del horario de oficina, no estamos disponibles las 24 horas del da, los 7 das de la semana.   Si tiene un problema urgente y no puede comunicarse con nosotros, puede optar por buscar atencin mdica  en el consultorio de su doctor(a), en una clnica privada, en un centro de atencin urgente o en una sala de emergencias.  Si tiene una emergencia mdica, por favor llame inmediatamente al 911 o vaya a la sala de emergencias.  Nmeros de bper  - Dr. Kowalski: [phone]  - Dra. Moye: [phone]  - Dra. Stewart: [phone]  En caso de inclemencias del tiempo, por favor llame a nuestra lnea principal al [phone] para una actualizacin sobre el estado de cualquier retraso o cierre.  Consejos para la medicacin en dermatologa: Por favor, guarde las cajas en las que vienen los medicamentos de uso tpico para ayudarle a seguir las instrucciones sobre dnde y cmo usarlos. Las farmacias generalmente imprimen las instrucciones del medicamento slo en las cajas y no directamente en los tubos del medicamento.   Si su medicamento es muy caro, por favor, pngase en contacto con nuestra oficina llamando al [phone] y presione la opcin 4 o envenos un mensaje a travs de MyChart.   No podemos decirle cul ser su copago por los medicamentos por adelantado ya que esto es diferente dependiendo de la cobertura de su seguro.  Sin embargo, es posible que podamos encontrar un medicamento sustituto a menor costo o llenar un formulario para que el seguro cubra el medicamento que se considera necesario.   Si se requiere una autorizacin previa para que su compaa de seguros cubra su medicamento, por favor permtanos de 1 a 2 das hbiles para completar este proceso.  Los precios de los medicamentos varan con frecuencia dependiendo del lugar de dnde se surte la receta y alguna farmacias pueden ofrecer precios ms baratos.  El sitio web www.goodrx.com tiene cupones para medicamentos de diferentes farmacias. Los precios aqu no tienen en cuenta lo que podra costar con la ayuda del seguro (puede ser ms barato con su seguro), pero el sitio web puede darle el precio si no utiliz ningn seguro.  - Puede imprimir el cupn correspondiente y llevarlo con su receta a la farmacia.  - Tambin puede pasar por nuestra oficina durante el horario de atencin regular y recoger una tarjeta de cupones de GoodRx.  -   Si necesita que su receta se enve electrnicamente a una farmacia diferente, informe a nuestra oficina a travs de MyChart de [place] o por telfono llamando al [phone] y presione la opcin 4.  

## 2022-09-22 NOTE — Progress Notes (Signed)
   Follow-Up Visit   Subjective  Susan [name] is a 62 y.o. female who presents for the following: Spots of glabella and left forehead The patient has spots, moles and lesions to be evaluated, some may be new or changing and the patient may have concern these could be cancer.  The following portions of the chart were reviewed this encounter and updated as appropriate: medications, allergies, medical history  Review of Systems:  No other skin or systemic complaints except as noted in HPI or Assessment and Plan.  Objective  Well appearing patient in no apparent distress; mood and affect are within normal limits. A focused examination was performed of the following areas: Face Relevant exam findings are noted in the Assessment and Plan.  Left forehead x 1, glabella x 1 (2) Erythematous stuck-on, waxy papule or plaque   Assessment & Plan   ACTINIC DAMAGE - chronic, secondary to cumulative UV radiation exposure/sun exposure over time - diffuse scaly erythematous macules with underlying dyspigmentation - Recommend daily broad spectrum sunscreen SPF 30+ to sun-exposed areas, reapply every 2 hours as needed.  - Recommend staying in the shade or wearing long sleeves, sun glasses (UVA+UVB protection) and wide brim hats (4-inch brim around the entire circumference of the hat). - Call for new or changing lesions.  LENTIGINES Exam: scattered tan macules Due to sun exposure Treatment Plan: Benign-appearing, observe. Recommend daily broad spectrum sunscreen SPF 30+ to sun-exposed areas, reapply every 2 hours as needed.  Call for any changes  SEBORRHEIC KERATOSIS - Stuck-on, waxy, tan-brown papules and/or plaques  - Benign-appearing - Discussed benign etiology and prognosis. - Observe - Call for any changes  Purpura - Chronic; persistent and recurrent.  Treatable, but not curable. - Violaceous macules and patches - Benign - Related to trauma, age, sun damage and/or use of blood  thinners, chronic use of topical and/or oral steroids - Observe - Can use OTC arnica containing moisturizer such as Dermend Bruise Formula if desired - Call for worsening or other concerns  Inflamed seborrheic keratosis (2) Left forehead x 1, glabella x 1  Destruction of lesion - Left forehead x 1, glabella x 1 Complexity: simple   Destruction method: cryotherapy   Informed consent: discussed and consent obtained   Timeout:  patient name, date of birth, surgical site, and procedure verified Lesion destroyed using liquid nitrogen: Yes   Region frozen until ice ball extended beyond lesion: Yes   Outcome: patient tolerated procedure well with no complications   Post-procedure details: wound care instructions given     Return if symptoms worsen or fail to improve.  I, Joanie Coddington, CMA, am acting as scribe for Armida Sans, MD .  Documentation: I have reviewed the above documentation for accuracy and completeness, and I agree with the above.  Armida Sans, MD

## 2022-09-26 ENCOUNTER — Other Ambulatory Visit: Payer: Self-pay | Admitting: Family

## 2022-09-26 ENCOUNTER — Telehealth: Payer: Self-pay | Admitting: Family Medicine

## 2022-09-26 NOTE — Telephone Encounter (Signed)
Spoke to pt and she stated that she had called the ems and they came and took her bp on 3 different times 1st one was 165/80 2nd time was 139/77, and the last time it was 139/70. They did not take her to ED because her bp was not 22 or above per pt. Spoke to [place] and informed the pt that if she has any sob heart palpatations, blurry vision that she needs to go to [facility] or ED and keep track of her BP and keep appt with Dr Birdie Sons [facility] 09/28/22

## 2022-09-26 NOTE — Telephone Encounter (Signed)
Pt called stating her BP has been high all weekend long 175/104 sent to access nurse

## 2022-09-26 NOTE — Telephone Encounter (Signed)
Called Patient to check on her after getting the Access Nurse note and Susan [name]'s recommendation of going to the ED due to the all over the place blood pressure and history of stroke. The whole message from Susan [name] was not read to the Patient when she called back. She was not advised to go to the ED like Susan [name] stated. When I called to check on her she stated that she takes her medications between 7& 8 in the mornings. One hour after taking her medication her BP was 169/104 but at 5:00 am before medications her BP was 133/77. Last Thursday she went to dermatology around 11:00 am and her BP was 228/101 then dermatology rechecked it before she left and it was 186/96. Patient states she will go to the ED today and she will follow up with Dr. Birdie Sons on Wednesday. Patient feels as if the medications are not working well for her.

## 2022-09-26 NOTE — Telephone Encounter (Signed)
noted 

## 2022-09-26 NOTE — Telephone Encounter (Signed)
Pt called back and I read the message to her and she understood 

## 2022-09-26 NOTE — Telephone Encounter (Signed)
Pt called in asking to speak to [place]. Call was transferred to [facility].

## 2022-09-26 NOTE — Telephone Encounter (Signed)
Patient is scheduled to see Dr. Birdie Sons on 09/28/22. Is there any other recommendations for this Patient?

## 2022-09-26 NOTE — Telephone Encounter (Signed)
LVM to call back to office to inform pt that she does need to be evaluated today due to history of stroke.

## 2022-09-27 ENCOUNTER — Other Ambulatory Visit: Payer: 59

## 2022-09-27 NOTE — Telephone Encounter (Signed)
Noted. I will see her tomorrow as planned.

## 2022-09-28 ENCOUNTER — Ambulatory Visit (INDEPENDENT_AMBULATORY_CARE_PROVIDER_SITE_OTHER): Payer: 59 | Admitting: Family Medicine

## 2022-09-28 VITALS — BP 126/80 | HR 68 | Temp 97.9°F | Ht 62.0 in | Wt 213.6 lb

## 2022-09-28 DIAGNOSIS — Z01818 Encounter for other preprocedural examination: Secondary | ICD-10-CM | POA: Diagnosis not present

## 2022-09-28 DIAGNOSIS — I10 Essential (primary) hypertension: Secondary | ICD-10-CM | POA: Diagnosis not present

## 2022-09-28 DIAGNOSIS — E119 Type 2 diabetes mellitus without complications: Secondary | ICD-10-CM | POA: Diagnosis not present

## 2022-09-28 DIAGNOSIS — Z7985 Long-term (current) use of injectable non-insulin antidiabetic drugs: Secondary | ICD-10-CM

## 2022-09-28 DIAGNOSIS — E785 Hyperlipidemia, unspecified: Secondary | ICD-10-CM

## 2022-09-28 LAB — HEPATIC FUNCTION PANEL
ALT: 14 U/L (ref 0–35)
AST: 15 U/L (ref 0–37)
Albumin: 4 g/dL (ref 3.5–5.2)
Alkaline Phosphatase: 82 U/L (ref 39–117)
Bilirubin, Direct: 0.1 mg/dL (ref 0.0–0.3)
Total Bilirubin: 0.5 mg/dL (ref 0.2–1.2)
Total Protein: 6.7 g/dL (ref 6.0–8.3)

## 2022-09-28 LAB — LDL CHOLESTEROL, DIRECT: Direct LDL: 38 mg/dL

## 2022-09-28 MED ORDER — AMLODIPINE BESYLATE 5 MG PO TABS
5.0000 mg | ORAL_TABLET | Freq: Every day | ORAL | 2 refills | Status: DC
Start: 2022-09-28 — End: 2022-11-21

## 2022-09-28 NOTE — Assessment & Plan Note (Signed)
Chronic issue.  Continue Crestor 20 mg daily.  Check LDL and hepatic function panel.

## 2022-09-28 NOTE — Assessment & Plan Note (Signed)
Chronic issue.  Above goal.  Discussed options of increasing spironolactone or retrying amlodipine.  Patient opted to retry amlodipine.  We will start with 5 mg daily of amlodipine.  She will continue spironolactone 25 mg daily, telmisartan 80 mg daily, and carvedilol 25 mg twice daily.  She will monitor for swelling and constipation with the amlodipine.  She will return in about 1 week for follow-up with me.

## 2022-09-28 NOTE — Assessment & Plan Note (Signed)
Chronic issue.  Well-controlled.  She will continue Ozempic 1 mg weekly.  She can stop this 7 days prior to her procedure.

## 2022-09-28 NOTE — Progress Notes (Signed)
[name], MD Phone: [phone]  Susan [name] is a 62 y.o. female who presents today for f/u.  DIABETES Disease Monitoring: Blood Sugar ranges-not checking Polyuria/phagia/dipsia- no      Optho- due Medications: Compliance- taking ozempic Hypoglycemic symptoms- no  HYPERTENSION Disease Monitoring Home BP Monitoring 160s/100s at times Chest pain- no    Dyspnea- no Medications Compliance-  taking coreg, telmisartan, spironolactone. History of hyponatremia with HCTZ. Has taken amlodipine, though notes no adverse effects while on that in the past.   Edema- no BMET    Component Value Date/Time   NA 135 09/14/2022 0757   NA 137 05/28/2020 0903   NA 136 04/29/2014 1133   K 4.1 09/14/2022 0757   K 4.2 04/29/2014 1133   CL 100 09/14/2022 0757   CL 103 04/29/2014 1133   CO2 25 09/14/2022 0757   CO2 30 04/29/2014 1133   GLUCOSE 81 09/14/2022 0757   GLUCOSE 85 04/29/2014 1133   BUN 12 09/14/2022 0757   BUN 9 05/28/2020 0903   BUN 9 04/29/2014 1133   CREATININE 1.09 09/14/2022 0757   CREATININE 0.92 10/16/2019 1139   CALCIUM 9.1 09/14/2022 0757   CALCIUM 9.1 04/29/2014 1133   GFRNONAA >60 08/02/2022 1153   GFRNONAA 69 10/16/2019 1139   GFRAA 64 05/28/2020 0903   GFRAA 80 10/16/2019 1139   HYPERLIPIDEMIA Symptoms Chest pain on exertion:  no   Medications: Compliance- taking crestor  Lipid Panel     Component Value Date/Time   CHOL 211 (H) 07/06/2022 0528   CHOL 158 07/26/2018 1101   TRIG 166 (H) 07/06/2022 0528   HDL 50 07/06/2022 0528   HDL 44 07/26/2018 1101   CHOLHDL 4.2 07/06/2022 0528   VLDL 33 07/06/2022 0528   LDLCALC 128 (H) 07/06/2022 0528   LDLCALC 79 08/21/2019 1122   LABVLDL 27 07/26/2018 1101   Patient is scheduled for laminectomy.  Susan [name] notes no chest pain or shortness of breath with going up 2 flights of stairs.  Tobacco abuse: Patient is now smoking less than half a pack per day.  Susan [name] has reduced her intake to this.  Susan [name] plans to quit when  Susan [name] goes on her trip to [place].   Social History   Tobacco Use  Smoking Status Every Day   Packs/day: 0.50   Years: 41.00   Additional pack years: 0.00   Total pack years: 20.50   Types: Cigarettes  Smokeless Tobacco Never    Current Outpatient Medications on File Prior to Visit  Medication Sig Dispense Refill   acetaminophen (TYLENOL) 325 MG tablet Take 650 mg by mouth every 6 (six) hours as needed.     albuterol (VENTOLIN HFA) 108 (90 Base) MCG/ACT inhaler Inhale 2 puffs into the lungs every 6 (six) hours as needed for wheezing or shortness of breath. 8 g 2   aspirin EC 81 MG tablet Take 1 tablet (81 mg total) by mouth daily. Swallow whole. 30 tablet 12   carvedilol (COREG) 25 MG tablet TAKE 1 TABLET BY MOUTH TWICE DAILY WITH MEAL. MONITOR HEART RATE 60 tablet 3   clotrimazole (LOTRIMIN) 1 % cream Apply 1 application topically 2 (two) times daily as needed.     cyclobenzaprine (FLEXERIL) 5 MG tablet Take 5 mg by mouth 2 (two) times daily as needed for muscle spasms.      DULoxetine (CYMBALTA) 20 MG capsule TAKE 1 CAPSULE BY MOUTH EVERY DAY WITH 30 MG CAPSULE 30 capsule 3   DULoxetine (CYMBALTA) 30 MG  capsule Take 1 capsule (30 mg total) by mouth daily. Take daily with 20 mg cap. 30 capsule 3   gabapentin (NEURONTIN) 300 MG capsule Take 600 mg by mouth 2 (two) times daily.      HYDROcodone-acetaminophen (NORCO/VICODIN) 5-325 MG tablet Take 1 tablet by mouth 3 (three) times daily as needed for moderate pain.     naloxone (NARCAN) nasal spray 4 mg/0.1 mL Place into the nose.     OZEMPIC, 1 MG/DOSE, 4 MG/3ML SOPN INJECT 1MG  UNDER THE SKIN (SUBCUTANEOUSLY) ONCE WEEKLY ON SUNDAY 3 mL 0   pantoprazole (PROTONIX) 40 MG tablet TAKE 1 TABLET(40 MG) BY MOUTH DAILY 90 tablet 3   rosuvastatin (CRESTOR) 20 MG tablet Take 1 tablet (20 mg total) by mouth daily. 30 tablet 3   spironolactone (ALDACTONE) 25 MG tablet Take 1 tablet (25 mg total) by mouth daily. 90 tablet 1   telmisartan (MICARDIS) 80  MG tablet TAKE 1 TABLET(80 MG) BY MOUTH DAILY 90 tablet 1   traZODone (DESYREL) 50 MG tablet Take 1 tablet (50 mg total) by mouth at bedtime. 30 tablet 1   Vitamin D, Ergocalciferol, (DRISDOL) 1.25 MG (50000 UNIT) CAPS capsule NEW PRESCRIPTION REQUEST: TAKE ONE CAPSULE BY MOUTH EVERY WEEK 13 capsule 1   No current facility-administered medications on file prior to visit.     ROS see history of present illness  Objective  Physical Exam Vitals:   09/28/22 0915 09/28/22 0922  BP: 138/82 126/80  Pulse: 68   Temp: 97.9 F (36.6 C)   SpO2: 99%     BP Readings from Last 3 Encounters:  09/28/22 126/80  09/22/22 (!) 186/88  09/20/22 (!) 140/83   Wt Readings from Last 3 Encounters:  09/28/22 213 lb 9.6 oz (96.9 kg)  09/20/22 215 lb (97.5 kg)  08/04/22 222 lb 12.8 oz (101.1 kg)    Physical Exam Constitutional:      General: Susan [name] is not in acute distress.    Appearance: Susan [name] is not diaphoretic.  Cardiovascular:     Rate and Rhythm: Normal rate and regular rhythm.     Heart sounds: Normal heart sounds.  Pulmonary:     Effort: Pulmonary effort is normal.     Breath sounds: Normal breath sounds.  Abdominal:     General: Bowel sounds are normal. There is no distension.     Palpations: Abdomen is soft.     Tenderness: There is no abdominal tenderness.  Skin:    General: Skin is warm and dry.  Neurological:     Mental Status: Susan [name] is alert.      Assessment/Plan: Please see individual problem list.  Primary hypertension Assessment & Plan: Chronic issue.  Above goal.  Discussed options of increasing spironolactone or retrying amlodipine.  Patient opted to retry amlodipine.  We will start with 5 mg daily of amlodipine.  Susan [name] will continue spironolactone 25 mg daily, telmisartan 80 mg daily, and carvedilol 25 mg twice daily.  Susan [name] will monitor for swelling and constipation with the amlodipine.  Susan [name] will return in about 1 week for follow-up with me.  Orders: -     amLODIPine  Besylate; Take 1 tablet (5 mg total) by mouth daily.  Dispense: 90 tablet; Refill: 2  Type 2 diabetes mellitus without complication, without long-term current use of insulin (HCC) Assessment & Plan: Chronic issue.  Well-controlled.  Susan [name] will continue Ozempic 1 mg weekly.  Susan [name] can stop this 7 days prior to her procedure.   Hyperlipidemia, unspecified hyperlipidemia type  Assessment & Plan: Chronic issue.  Continue Crestor 20 mg daily.  Check LDL and hepatic function panel.  Orders: -     LDL cholesterol, direct -     Hepatic function panel  Preop examination Assessment & Plan: Preoperative exam completed today.  Patient is low risk from a cardiovascular perspective.  Gupta perioperative risk score of 0.1%.  Patient does smoke and blood pressure has been elevated though otherwise is medically low risk.  We are working on getting her blood pressure down as outlined in the hypertension problem.  Anesthesia and the surgeon will have to make a decision on the day of surgery regarding where her blood pressure is at that time. Patient is as optimized as Susan [name] can be this close to her surgery. Ideally Susan [name] would have quit smoking prior to surgery, though moving forward with surgery while still smoking will be up to the surgeon.       Return in about 1 week (around 10/05/2022) for PCP for htn f/u.   [name], MD [facility]

## 2022-09-28 NOTE — Assessment & Plan Note (Addendum)
Preoperative exam completed today.  Patient is low risk from a cardiovascular perspective.  Gupta perioperative risk score of 0.1%.  Patient does smoke and blood pressure has been elevated though otherwise is medically low risk.  We are working on getting her blood pressure down as outlined in the hypertension problem.  Anesthesia and the surgeon will have to make a decision on the day of surgery regarding where her blood pressure is at that time. Patient is as optimized as she can be this close to her surgery. Ideally she would have quit smoking prior to surgery, though moving forward with surgery while still smoking will be up to the surgeon.

## 2022-10-05 ENCOUNTER — Encounter
Admission: RE | Admit: 2022-10-05 | Discharge: 2022-10-05 | Disposition: A | Discharge status: Home / Self Care | Payer: 59 | Source: Ambulatory Visit | Attending: Neurosurgery | Admitting: Neurosurgery

## 2022-10-05 VITALS — BP 141/77 | HR 64 | Resp 14 | Ht 62.0 in | Wt 211.9 lb

## 2022-10-05 DIAGNOSIS — Z01818 Encounter for other preprocedural examination: Secondary | ICD-10-CM

## 2022-10-05 DIAGNOSIS — R8290 Unspecified abnormal findings in urine: Secondary | ICD-10-CM | POA: Diagnosis not present

## 2022-10-05 DIAGNOSIS — R8271 Bacteriuria: Secondary | ICD-10-CM | POA: Insufficient documentation

## 2022-10-05 DIAGNOSIS — Z01812 Encounter for preprocedural laboratory examination: Secondary | ICD-10-CM | POA: Insufficient documentation

## 2022-10-05 HISTORY — DX: Morbid (severe) obesity due to excess calories: E66.01

## 2022-10-05 HISTORY — DX: Obstructive sleep apnea (adult) (pediatric): G47.33

## 2022-10-05 HISTORY — DX: Other specified symptoms and signs involving the circulatory and respiratory systems: R09.89

## 2022-10-05 HISTORY — DX: Family history of other specified conditions: Z84.89

## 2022-10-05 HISTORY — DX: Nicotine dependence, unspecified, uncomplicated: F17.200

## 2022-10-05 HISTORY — DX: Cocaine dependence, in remission: F14.21

## 2022-10-05 LAB — SURGICAL PCR SCREEN
MRSA, PCR: NEGATIVE
Staphylococcus aureus: NEGATIVE

## 2022-10-05 LAB — URINALYSIS, ROUTINE W REFLEX MICROSCOPIC
Bilirubin Urine: NEGATIVE
Glucose, UA: NEGATIVE mg/dL
Ketones, ur: NEGATIVE mg/dL
Nitrite: NEGATIVE
Protein, ur: NEGATIVE mg/dL
Specific Gravity, Urine: 1.003 — ABNORMAL LOW (ref 1.005–1.030)
Squamous Epithelial / HPF: NONE SEEN /HPF (ref 0–5)
pH: 6 (ref 5.0–8.0)

## 2022-10-05 LAB — TYPE AND SCREEN
ABO/RH(D): B POS
Antibody Screen: NEGATIVE

## 2022-10-05 NOTE — Patient Instructions (Addendum)
Your procedure is scheduled on:10-10-22 Monday Report to the Registration Desk on the 1st floor of the [facility].Then proceed to the 2nd floor Surgery Desk To find out your arrival time, please call [phone] between 1PM - 3PM on:10-07-22 Friday If your arrival time is 6:00 am, do not arrive before that time as the [facility] entrance doors do not open until 6:00 am.  REMEMBER: Instructions that are not followed completely may result in serious medical risk, up to and including death; or upon the discretion of your surgeon and anesthesiologist your surgery may need to be rescheduled.  Do not eat food after midnight the night before surgery.  No gum chewing or hard candies.  You may however, drink Water up to 2 hours before you are scheduled to arrive for your surgery. Do not drink anything within 2 hours of your scheduled arrival time  One week prior to surgery: Stop Anti-inflammatories (NSAIDS) such as Advil, Aleve, Ibuprofen, Motrin, Naproxen, Naprosyn and Aspirin based products such as Excedrin, Goody's Powder, BC Powder.You may however, take Tylenol/Hydrocodone if needed for pain up until the day of surgery. Stop ANY OVER THE COUNTER supplements/vitamins NOW (10-05-22) until after surgery.   Continue taking all prescribed medications with the exception of the following: -Stop OZEMPIC 7 days prior to surgery-Last dose was on 09-29-22-Do NOT take again until AFTER your surgery  TAKE ONLY THESE MEDICATIONS THE MORNING OF SURGERY WITH A SIP OF WATER: -amLODipine (NORVASC)  -carvedilol (COREG)  -DULoxetine (CYMBALTA)  -gabapentin (NEURONTIN)  -rosuvastatin (CRESTOR)  -pantoprazole (PROTONIX)-take one the night before and one on the morning of surgery - helps to prevent nausea after surgery.)  Continue your 81 mg Aspirin up until the day prior to surgery-Do NOT take the morning of surgery  Use your albuterol (VENTOLIN HFA) inhaler the day of surgery and bring your Inhaler to the  hospital  No Alcohol for 24 hours before or after surgery.  No Smoking including e-cigarettes for 24 hours before surgery.  No chewable tobacco products for at least 6 hours before surgery.  No nicotine patches on the day of surgery.  Do not use any "recreational" drugs for at least a week (preferably 2 weeks) before your surgery.  Please be advised that the combination of cocaine and anesthesia may have negative outcomes, up to and including death. If you test positive for cocaine, your surgery will be cancelled.  On the morning of surgery brush your teeth with toothpaste and water, you may rinse your mouth with mouthwash if you wish. Do not swallow any toothpaste or mouthwash.  Use CHG Soap as directed on instruction sheet.  Do not wear jewelry, make-up, hairpins, clips or nail polish.  Do not wear lotions, powders, or perfumes.   Do not shave body hair from the neck down 48 hours before surgery.  Contact lenses, hearing aids and dentures may not be worn into surgery.  Do not bring valuables to the hospital. [facility] is not responsible for any missing/lost belongings or valuables.   Bring your C-PAP to the hospital  Notify your doctor if there is any change in your medical condition (cold, fever, infection).  Wear comfortable clothing (specific to your surgery type) to the hospital.  After surgery, you can help prevent lung complications by doing breathing exercises.  Take deep breaths and cough every 1-2 hours. Your doctor may order a device called an Incentive Spirometer to help you take deep breaths. When coughing or sneezing, hold a pillow firmly against  your incision with both hands. This is called "splinting." Doing this helps protect your incision. It also decreases belly discomfort.  If you are being admitted to the hospital overnight, leave your suitcase in the car. After surgery it may be brought to your room.  In case of increased patient census, it may be  necessary for you, the patient, to continue your postoperative care in the Same Day Surgery department.  If you are being discharged the day of surgery, you will not be allowed to drive home. You will need a responsible individual to drive you home and stay with you for 24 hours after surgery.   If you are taking public transportation, you will need to have a responsible individual with you.  Please call the Pre-admissions Testing Dept. at [phone] if you have any questions about these instructions.  Surgery Visitation Policy:  Patients having surgery or a procedure may have two visitors.  Children under the age of 67 must have an adult with them who is not the patient.    Pre-operative 5 CHG Bath Instructions   You can play a key role in reducing the risk of infection after surgery. Your skin needs to be as free of germs as possible. You can reduce the number of germs on your skin by washing with CHG (chlorhexidine gluconate) soap before surgery. CHG is an antiseptic soap that kills germs and continues to kill germs even after washing.   DO NOT use if you have an allergy to chlorhexidine/CHG or antibacterial soaps. If your skin becomes reddened or irritated, stop using the CHG and notify one of our RNs at [phone].   Please shower with the CHG soap starting 4 days before surgery using the following schedule:     Please keep in mind the following:  DO NOT shave, including legs and underarms, starting the day of your first shower.   You may shave your face at any point before/day of surgery.  Place clean sheets on your bed the day you start using CHG soap. Use a clean washcloth (not used since being washed) for each shower. DO NOT sleep with pets once you start using the CHG.   CHG Shower Instructions:  If you choose to wash your hair and private area, wash first with your normal shampoo/soap.  After you use shampoo/soap, rinse your hair and body thoroughly to remove  shampoo/soap residue.  Turn the water OFF and apply about 3 tablespoons (45 ml) of CHG soap to a CLEAN washcloth.  Apply CHG soap ONLY FROM YOUR NECK DOWN TO YOUR TOES (washing for 3-5 minutes)  DO NOT use CHG soap on face, private areas, open wounds, or sores.  Pay special attention to the area where your surgery is being performed.  If you are having back surgery, having someone wash your back for you may be helpful. Wait 2 minutes after CHG soap is applied, then you may rinse off the CHG soap.  Pat dry with a clean towel  Put on clean clothes/pajamas   If you choose to wear lotion, please use ONLY the CHG-compatible lotions on the back of this paper.     Additional instructions for the day of surgery: DO NOT APPLY any lotions, deodorants, cologne, or perfumes.   Put on clean/comfortable clothes.  Brush your teeth.  Ask your nurse before applying any prescription medications to the skin.      CHG Compatible Lotions   Aveeno Moisturizing lotion  Cetaphil Moisturizing Cream  Cetaphil  Moisturizing Lotion  Clairol Herbal Essence Moisturizing Lotion, Dry Skin  Clairol Herbal Essence Moisturizing Lotion, Extra Dry Skin  Clairol Herbal Essence Moisturizing Lotion, Normal Skin  Curel Age Defying Therapeutic Moisturizing Lotion with Alpha Hydroxy  Curel Extreme Care Body Lotion  Curel Soothing Hands Moisturizing Hand Lotion  Curel Therapeutic Moisturizing Cream, Fragrance-Free  Curel Therapeutic Moisturizing Lotion, Fragrance-Free  Curel Therapeutic Moisturizing Lotion, Original Formula  Eucerin Daily Replenishing Lotion  Eucerin Dry Skin Therapy Plus Alpha Hydroxy Crme  Eucerin Dry Skin Therapy Plus Alpha Hydroxy Lotion  Eucerin Original Crme  Eucerin Original Lotion  Eucerin Plus Crme Eucerin Plus Lotion  Eucerin TriLipid Replenishing Lotion  Keri Anti-Bacterial Hand Lotion  Keri Deep Conditioning Original Lotion Dry Skin Formula Softly Scented  Keri Deep Conditioning  Original Lotion, Fragrance Free Sensitive Skin Formula  Keri Lotion Fast Absorbing Fragrance Free Sensitive Skin Formula  Keri Lotion Fast Absorbing Softly Scented Dry Skin Formula  Keri Original Lotion  Keri Skin Renewal Lotion Keri Silky Smooth Lotion  Keri Silky Smooth Sensitive Skin Lotion  Nivea Body Creamy Conditioning Oil  Nivea Body Extra Enriched Teacher, adult education Moisturizing Lotion Nivea Crme  Nivea Skin Firming Lotion  NutraDerm 30 Skin Lotion  NutraDerm Skin Lotion  NutraDerm Therapeutic Skin Cream  NutraDerm Therapeutic Skin Lotion  ProShield Protective Hand Cream  Provon moisturizing lotion

## 2022-10-06 ENCOUNTER — Encounter: Payer: Self-pay | Admitting: Dermatology

## 2022-10-06 LAB — URINE CULTURE: Culture: >=100000 — AB

## 2022-10-07 ENCOUNTER — Telehealth: Payer: Self-pay | Admitting: Urgent Care

## 2022-10-07 DIAGNOSIS — N390 Urinary tract infection, site not specified: Secondary | ICD-10-CM

## 2022-10-07 DIAGNOSIS — Z01812 Encounter for preprocedural laboratory examination: Secondary | ICD-10-CM

## 2022-10-07 LAB — URINE CULTURE

## 2022-10-07 MED ORDER — SULFAMETHOXAZOLE-TRIMETHOPRIM 800-160 MG PO TABS
1.0000 | ORAL_TABLET | Freq: Two times a day (BID) | ORAL | 0 refills | Status: DC
Start: 2022-10-07 — End: 2022-10-19

## 2022-10-07 NOTE — Progress Notes (Signed)
[name] Perioperative Services: Pre-Admission/Anesthesia Testing  Abnormal Lab Notification and Treatment Plan of Care   Date: 10/07/22  Name: Susan [name] MRN:   [id number]  Re: Abnormal labs noted during PAT appointment   Notified:  Provider Name Provider Role Notification Mode  Venetia Night, MD Neurosurgery Routed and/or faxed via [facility]   Abnormal Lab Value(s):   Lab Results  Component Value Date   COLORURINE STRAW (A) 10/05/2022   APPEARANCEUR CLEAR (A) 10/05/2022   LABSPEC 1.003 (L) 10/05/2022   PHURINE 6.0 10/05/2022   GLUCOSEU NEGATIVE 10/05/2022   HGBUR SMALL (A) 10/05/2022   BILIRUBINUR NEGATIVE 10/05/2022   KETONESUR NEGATIVE 10/05/2022   PROTEINUR NEGATIVE 10/05/2022   UROBILINOGEN 0.2 10/05/2021   NITRITE NEGATIVE 10/05/2022   LEUKOCYTESUR SMALL (A) 10/05/2022   EPIU NONE SEEN 10/05/2022   WBCU 6-10 10/05/2022   RBCU 0-5 10/05/2022   BACTERIA MANY (A) 10/05/2022   CULT >=100,000 COLONIES/mL KLEBSIELLA PNEUMONIAE (A) 10/05/2022    Clinical Information and Notes:  Patient is scheduled for an elective RIGHT L4-5 LAMINOFORAMINOTOMY on 10/10/2022  UA performed in PAT consistent with/concerning for infection.  No leukocytosis noted on CBC; WBC 8.6 Renal function: CrCl cannot be calculated (Patient's most recent lab result is older than the maximum 21 days allowed.). Urine C&S added to assess for pathogenically significant growth.  Impression and Plan:  Susan [name] with a UA that was (+) for infection; reflex culture sent. Contacted patient to discuss. Patient reporting that she is experiencing minor symptoms; mainly frequency and urgency. (+) back pain, however it is difficult to distinguish this as being a new symptoms in the setting of more chronic radicular pain for which she is having surgery. Patient with surgery scheduled soon. In efforts to avoid delaying patient's procedure, or have her experience any potentially  significant perioperative complications related to the aforementioned, I would like to proceed with empiric treatment for urinary tract infection.  Allergies reviewed. Culture report also reviewed to ensure culture appropriate coverage is being provided. Will treat with a 5 day course of SMZ-TMP DS. Patient encouraged to complete the entire course of antibiotics even if she begins to feel better.  Meds ordered this encounter  Medications   sulfamethoxazole-trimethoprim (BACTRIM DS) 800-160 MG tablet    Sig: Take 1 tablet by mouth 2 (two) times daily for 5 days. Increase water intake while taking this medication.    Dispense:  10 tablet    Refill:  0   Patient encouraged to increase her fluid intake as much as possible. Discussed that water is always best to flush the urinary tract. She was advised to avoid caffeine containing fluids until her infections clears, as caffeine can cause her to experience painful bladder spasms.   May use Tylenol as needed for pain/fever should she experience these symptoms. May also use over the counter phenazopyridine to help relieve her current urinary pain.   Patient instructed to call surgeon's office or PAT with any questions or concerns related to the above outlined course of treatment. Additionally, she was instructed to call if she feels like she is getting worse overall while on treatment. Results and treatment plan of care forwarded to primary attending surgeon to make them aware.   Encounter Diagnoses  Name Primary?   Pre-operative laboratory examination Yes   UTI due to Klebsiella species    Susan Mulling, MSN, APRN, FNP-C, CEN [facility]  Peri-operative Services Nurse Practitioner Phone: [phone] Fax: [phone] 10/07/22  8:56 AM  NOTE: This note has been prepared using Scientist, clinical (histocompatibility and immunogenetics). Despite my best ability to proofread, there is always the potential that unintentional transcriptional errors may still occur  from this process.

## 2022-10-08 NOTE — Anesthesia Preprocedure Evaluation (Signed)
Anesthesia Evaluation  Patient identified by MRN, date of birth, ID band Patient awake    Reviewed: Allergy & Precautions, NPO status , Patient's Chart, lab work & pertinent test results  History of Anesthesia Complications Negative for: history of anesthetic complications  Airway Mallampati: II  TM Distance: <3 FB Neck ROM: Full    Dental  (+) Poor Dentition, Missing   Pulmonary asthma , sleep apnea and Continuous Positive Airway Pressure Ventilation , COPD,  COPD inhaler, Current SmokerPatient did not abstain from smoking. Rare inhaler use, never hospitalized for copd   Pulmonary exam normal breath sounds clear to auscultation       Cardiovascular Exercise Tolerance: Poor METShypertension, Pt. on medications + DOE  (-) CAD and (-) Past MI (-) dysrhythmias  Rhythm:Regular Rate:Normal - Systolic murmurs TTE 2024: 1. Left ventricular ejection fraction, by estimation, is 60 to 65%. The  left ventricle has normal function. The left ventricle has no regional  wall motion abnormalities. Left ventricular diastolic parameters are  consistent with Grade I diastolic  dysfunction (impaired relaxation).   2. Right ventricular systolic function is normal. The right ventricular  size is normal.   3. The mitral valve is grossly normal. No evidence of mitral valve  regurgitation.   4. The aortic valve was not well visualized. Aortic valve regurgitation  is not visualized.     Neuro/Psych  PSYCHIATRIC DISORDERS Anxiety Depression Bipolar Disorder   Patient denied hx of stroke, but brother at bedside reminded her of CVA at end of March. It has been about 2.5 months since then. Followed up with neurology one month after the stroke, who did not make any further recommendations for optimization other than continuing what she is doing (work on smoking cessation, diet).  Family practitioner deemed her optimized from an overall perspective. CVA,  No Residual Symptoms    GI/Hepatic ,GERD  Medicated and Controlled,,(+)     (-) substance abuse  , Hepatitis -, C  Endo/Other  diabetes  On GLP1, held for over a week. Denies GI symptoms today  Renal/GU negative Renal ROS     Musculoskeletal  (+) Arthritis ,    Abdominal  (+) + obese  Peds  Hematology   Anesthesia Other Findings Past Medical History: No date: ADD (attention deficit disorder) No date: Alcohol abuse No date: Anemia No date: Anxiety 02/20/2018: Aortic atherosclerosis (HCC)     Comment:  Chest CT Sept 2019 No date: Arthritis     Comment:  rheumatoid arthritis No date: Asthma No date: Bipolar disorder (HCC) No date: Centrilobular emphysema (HCC) No date: Cocaine use disorder, moderate, in sustained remission (HCC) No date: Constipation 09/28/2016: Degenerative disc disease at L5-S1 level     Comment:  See ortho note May 2018 No date: Depression     Comment:  bipolar, hx of suicide attempt No date: Diabetes mellitus, type 2 (HCC) 2013: Diverticulitis No date: DOE (dyspnea on exertion) No date: Drug use No date: Family history of adverse reaction to anesthesia     Comment:  mom-delayed emergence No date: GERD (gastroesophageal reflux disease) No date: H/O suicide attempt     Comment:  slit wrists 06/26/2014: Hepatitis C     Comment:  treated with Harvoni No date: Hip pain No date: History of echocardiogram     Comment:  a. 04/2019 Echo: EF >65%, nl RV fxn. 2013: History of MRSA infection No date: HLD (hyperlipidemia) No date: Hypertension     Comment:  a. 06/2021 Renal Duplex: ? bilat RAS;  b. 06/2021 CTA Abd:               No signif RAS. No date: Hyponatremia No date: Hypothyroidism 11/08/2012: Incisional hernia No date: Knee pain No date: Left carotid bruit No date: Morbid obesity (HCC) No date: Multinodular thyroid No date: OSA (obstructive sleep apnea) No date: OSA on CPAP No date: Osteoporosis No date: Palpitations 09/24/2015:  Post-traumatic osteoarthritis of right knee 11/08/2012: Recurrent ventral hernia 05/10/2016: Status post total right knee replacement using cement 07/05/2022: Stroke (HCC)     Comment:  right arm,torso and right leg numbness No date: Tobacco use disorder No date: Vitamin B12 deficiency No date: Vitamin D deficiency disease  Reproductive/Obstetrics                             Anesthesia Physical Anesthesia Plan  ASA: 3  Anesthesia Plan: General   Post-op Pain Management: Ofirmev IV (intra-op)*   Induction: Intravenous  PONV Risk Score and Plan: 3 and Ondansetron, Dexamethasone and Midazolam  Airway Management Planned: Oral ETT and Video Laryngoscope Planned  Additional Equipment: None  Intra-op Plan:   Post-operative Plan: Extubation in OR  Informed Consent: I have reviewed the patients History and Physical, chart, labs and discussed the procedure including the risks, benefits and alternatives for the proposed anesthesia with the patient or authorized representative who has indicated his/her understanding and acceptance.     Dental advisory given  Plan Discussed with: CRNA and Surgeon  Anesthesia Plan Comments: (Discussed risks of anesthesia with patient, including PONV, sore throat, lip/dental/eye damage. Rare risks discussed as well, such as cardiorespiratory and neurological sequelae, and allergic reactions. I did specifically discuss neurological risk given her borderline time out from her last stroke. I told her normally we wait around 3 months before doing elective procedures after a stroke, however given the neurology and family medicine notes not believing her to be able to be optimized any further, I advised it was not unreasonable to proceed, however she would still be at increased neurological risk. She understands. Discussed the role of CRNA in patient's perioperative care. Patient understands.)       Anesthesia Quick Evaluation

## 2022-10-10 ENCOUNTER — Other Ambulatory Visit: Payer: Self-pay

## 2022-10-10 ENCOUNTER — Ambulatory Visit: Payer: 59

## 2022-10-10 ENCOUNTER — Observation Stay (HOSPITAL_BASED_OUTPATIENT_CLINIC_OR_DEPARTMENT_OTHER)
Admission: RE | Admit: 2022-10-10 | Discharge: 2022-10-11 | Disposition: A | Discharge status: Home / Self Care | Payer: 59 | Source: Ambulatory Visit | Attending: Neurosurgery | Admitting: Neurosurgery

## 2022-10-10 ENCOUNTER — Encounter
Admission: RE | Disposition: A | Discharge status: Home / Self Care | Payer: Self-pay | Source: Ambulatory Visit | Attending: Neurosurgery

## 2022-10-10 ENCOUNTER — Encounter: Payer: Self-pay | Admitting: Neurosurgery

## 2022-10-10 ENCOUNTER — Other Ambulatory Visit: Payer: Self-pay | Admitting: Family Medicine

## 2022-10-10 ENCOUNTER — Ambulatory Visit: Payer: 59 | Admitting: Anesthesiology

## 2022-10-10 ENCOUNTER — Ambulatory Visit: Payer: 59 | Admitting: Urgent Care

## 2022-10-10 DIAGNOSIS — F17210 Nicotine dependence, cigarettes, uncomplicated: Secondary | ICD-10-CM | POA: Insufficient documentation

## 2022-10-10 DIAGNOSIS — D649 Anemia, unspecified: Secondary | ICD-10-CM | POA: Diagnosis not present

## 2022-10-10 DIAGNOSIS — J9811 Atelectasis: Secondary | ICD-10-CM | POA: Diagnosis not present

## 2022-10-10 DIAGNOSIS — Z7982 Long term (current) use of aspirin: Secondary | ICD-10-CM | POA: Insufficient documentation

## 2022-10-10 DIAGNOSIS — M5416 Radiculopathy, lumbar region: Secondary | ICD-10-CM | POA: Insufficient documentation

## 2022-10-10 DIAGNOSIS — Z9889 Other specified postprocedural states: Secondary | ICD-10-CM

## 2022-10-10 DIAGNOSIS — M7138 Other bursal cyst, other site: Secondary | ICD-10-CM

## 2022-10-10 DIAGNOSIS — I10 Essential (primary) hypertension: Secondary | ICD-10-CM | POA: Diagnosis not present

## 2022-10-10 DIAGNOSIS — G9761 Postprocedural hematoma of a nervous system organ or structure following a nervous system procedure: Secondary | ICD-10-CM | POA: Diagnosis not present

## 2022-10-10 DIAGNOSIS — R339 Retention of urine, unspecified: Secondary | ICD-10-CM | POA: Diagnosis not present

## 2022-10-10 DIAGNOSIS — J9601 Acute respiratory failure with hypoxia: Secondary | ICD-10-CM | POA: Diagnosis not present

## 2022-10-10 DIAGNOSIS — E669 Obesity, unspecified: Secondary | ICD-10-CM | POA: Diagnosis present

## 2022-10-10 DIAGNOSIS — S064XAA Epidural hemorrhage with loss of consciousness status unknown, initial encounter: Secondary | ICD-10-CM | POA: Diagnosis not present

## 2022-10-10 DIAGNOSIS — Z01818 Encounter for other preprocedural examination: Secondary | ICD-10-CM

## 2022-10-10 DIAGNOSIS — R0602 Shortness of breath: Secondary | ICD-10-CM | POA: Diagnosis not present

## 2022-10-10 DIAGNOSIS — Z743 Need for continuous supervision: Secondary | ICD-10-CM | POA: Diagnosis not present

## 2022-10-10 DIAGNOSIS — J45909 Unspecified asthma, uncomplicated: Secondary | ICD-10-CM | POA: Insufficient documentation

## 2022-10-10 DIAGNOSIS — J4489 Other specified chronic obstructive pulmonary disease: Secondary | ICD-10-CM | POA: Diagnosis not present

## 2022-10-10 DIAGNOSIS — R338 Other retention of urine: Secondary | ICD-10-CM | POA: Diagnosis not present

## 2022-10-10 DIAGNOSIS — Z6838 Body mass index (BMI) 38.0-38.9, adult: Secondary | ICD-10-CM | POA: Diagnosis not present

## 2022-10-10 DIAGNOSIS — R531 Weakness: Secondary | ICD-10-CM | POA: Diagnosis not present

## 2022-10-10 DIAGNOSIS — Z96651 Presence of right artificial knee joint: Secondary | ICD-10-CM | POA: Insufficient documentation

## 2022-10-10 DIAGNOSIS — G96198 Other disorders of meninges, not elsewhere classified: Secondary | ICD-10-CM | POA: Diagnosis not present

## 2022-10-10 DIAGNOSIS — R200 Anesthesia of skin: Secondary | ICD-10-CM | POA: Diagnosis not present

## 2022-10-10 DIAGNOSIS — G834 Cauda equina syndrome: Secondary | ICD-10-CM | POA: Diagnosis not present

## 2022-10-10 DIAGNOSIS — M810 Age-related osteoporosis without current pathological fracture: Secondary | ICD-10-CM | POA: Diagnosis not present

## 2022-10-10 DIAGNOSIS — M545 Low back pain, unspecified: Secondary | ICD-10-CM | POA: Diagnosis not present

## 2022-10-10 DIAGNOSIS — Z96642 Presence of left artificial hip joint: Secondary | ICD-10-CM | POA: Insufficient documentation

## 2022-10-10 DIAGNOSIS — E119 Type 2 diabetes mellitus without complications: Secondary | ICD-10-CM | POA: Insufficient documentation

## 2022-10-10 DIAGNOSIS — G4733 Obstructive sleep apnea (adult) (pediatric): Secondary | ICD-10-CM | POA: Diagnosis not present

## 2022-10-10 DIAGNOSIS — M069 Rheumatoid arthritis, unspecified: Secondary | ICD-10-CM | POA: Diagnosis not present

## 2022-10-10 DIAGNOSIS — E785 Hyperlipidemia, unspecified: Secondary | ICD-10-CM | POA: Diagnosis not present

## 2022-10-10 DIAGNOSIS — G8929 Other chronic pain: Secondary | ICD-10-CM

## 2022-10-10 DIAGNOSIS — F319 Bipolar disorder, unspecified: Secondary | ICD-10-CM | POA: Diagnosis present

## 2022-10-10 DIAGNOSIS — F1421 Cocaine dependence, in remission: Secondary | ICD-10-CM | POA: Diagnosis present

## 2022-10-10 DIAGNOSIS — E039 Hypothyroidism, unspecified: Secondary | ICD-10-CM | POA: Insufficient documentation

## 2022-10-10 DIAGNOSIS — G9600 Cerebrospinal fluid leak, unspecified: Secondary | ICD-10-CM | POA: Diagnosis not present

## 2022-10-10 DIAGNOSIS — R296 Repeated falls: Secondary | ICD-10-CM | POA: Diagnosis not present

## 2022-10-10 DIAGNOSIS — M549 Dorsalgia, unspecified: Secondary | ICD-10-CM | POA: Diagnosis not present

## 2022-10-10 DIAGNOSIS — R0902 Hypoxemia: Secondary | ICD-10-CM | POA: Diagnosis not present

## 2022-10-10 DIAGNOSIS — J432 Centrilobular emphysema: Secondary | ICD-10-CM | POA: Diagnosis not present

## 2022-10-10 DIAGNOSIS — Z981 Arthrodesis status: Secondary | ICD-10-CM | POA: Diagnosis not present

## 2022-10-10 DIAGNOSIS — Z7985 Long-term (current) use of injectable non-insulin antidiabetic drugs: Secondary | ICD-10-CM | POA: Diagnosis not present

## 2022-10-10 DIAGNOSIS — Z4789 Encounter for other orthopedic aftercare: Secondary | ICD-10-CM | POA: Diagnosis not present

## 2022-10-10 HISTORY — PX: FORAMINOTOMY 1 LEVEL: SHX5835

## 2022-10-10 LAB — GLUCOSE, CAPILLARY
Glucose-Capillary: 86 mg/dL (ref 70–99)
Glucose-Capillary: 110 mg/dL — ABNORMAL HIGH (ref 70–99)

## 2022-10-10 SURGERY — FORAMINOTOMY 1 LEVEL
Anesthesia: General | Laterality: Right

## 2022-10-10 MED ORDER — PROPOFOL 1000 MG/100ML IV EMUL
INTRAVENOUS | Status: AC
  Filled 2022-10-10: qty 200

## 2022-10-10 MED ORDER — SODIUM CHLORIDE 0.9% FLUSH: 3.0000 mL | INTRAVENOUS | Status: DC | PRN

## 2022-10-10 MED ORDER — LIDOCAINE HCL (CARDIAC) PF 100 MG/5ML IV SOSY
PREFILLED_SYRINGE | INTRAVENOUS | Status: DC | PRN
  Administered 2022-10-10: 100 mg via INTRAVENOUS

## 2022-10-10 MED ORDER — POLYETHYLENE GLYCOL 3350 17 G PO PACK: 17.0000 g | PACK | Freq: Every day | ORAL | Status: DC | PRN

## 2022-10-10 MED ORDER — BISACODYL 10 MG RE SUPP: 10.0000 mg | Freq: Every day | RECTAL | Status: DC | PRN

## 2022-10-10 MED ORDER — TRAZODONE HCL 50 MG PO TABS
50.0000 mg | ORAL_TABLET | Freq: Every day | ORAL | Status: DC
  Administered 2022-10-10: 50 mg via ORAL
  Filled 2022-10-10: qty 1

## 2022-10-10 MED ORDER — IRBESARTAN 150 MG PO TABS
300.0000 mg | ORAL_TABLET | Freq: Every day | ORAL | Status: DC
  Administered 2022-10-10 – 2022-10-11 (×2): 300 mg via ORAL
  Filled 2022-10-10 (×2): qty 2

## 2022-10-10 MED ORDER — PHENYLEPHRINE HCL-NACL 20-0.9 MG/250ML-% IV SOLN
INTRAVENOUS | Status: DC | PRN
  Administered 2022-10-10: 25 ug/min via INTRAVENOUS

## 2022-10-10 MED ORDER — ONDANSETRON HCL 4 MG/2ML IJ SOLN
INTRAMUSCULAR | Status: DC | PRN
  Administered 2022-10-10: 4 mg via INTRAVENOUS

## 2022-10-10 MED ORDER — SULFAMETHOXAZOLE-TRIMETHOPRIM 800-160 MG PO TABS
1.0000 | ORAL_TABLET | Freq: Two times a day (BID) | ORAL | Status: DC
  Administered 2022-10-10 – 2022-10-11 (×2): 1 via ORAL
  Filled 2022-10-10 (×2): qty 1

## 2022-10-10 MED ORDER — CHLORHEXIDINE GLUCONATE 0.12 % MT SOLN
15.0000 mL | Freq: Once | OROMUCOSAL | Status: AC
  Administered 2022-10-10: 15 mL via OROMUCOSAL

## 2022-10-10 MED ORDER — PHENYLEPHRINE 80 MCG/ML (10ML) SYRINGE FOR IV PUSH (FOR BLOOD PRESSURE SUPPORT)
PREFILLED_SYRINGE | INTRAVENOUS | Status: DC | PRN
  Administered 2022-10-10: 160 ug via INTRAVENOUS
  Administered 2022-10-10: 80 ug via INTRAVENOUS
  Administered 2022-10-10: 160 ug via INTRAVENOUS
  Administered 2022-10-10: 80 ug via INTRAVENOUS
  Administered 2022-10-10: 160 ug via INTRAVENOUS
  Administered 2022-10-10: 80 ug via INTRAVENOUS
  Administered 2022-10-10: 160 ug via INTRAVENOUS
  Administered 2022-10-10: 80 ug via INTRAVENOUS

## 2022-10-10 MED ORDER — SODIUM CHLORIDE (PF) 0.9 % IJ SOLN
INTRAMUSCULAR | Status: DC | PRN
  Administered 2022-10-10: 60 mL

## 2022-10-10 MED ORDER — KETOROLAC TROMETHAMINE 15 MG/ML IJ SOLN
15.0000 mg | Freq: Four times a day (QID) | INTRAMUSCULAR | Status: AC
  Administered 2022-10-10 – 2022-10-11 (×4): 15 mg via INTRAVENOUS
  Filled 2022-10-10 (×3): qty 1

## 2022-10-10 MED ORDER — ACETAMINOPHEN 500 MG PO TABS
1000.0000 mg | ORAL_TABLET | Freq: Four times a day (QID) | ORAL | Status: DC
  Administered 2022-10-10 – 2022-10-11 (×3): 1000 mg via ORAL
  Filled 2022-10-10 (×3): qty 2

## 2022-10-10 MED ORDER — DULOXETINE HCL 20 MG PO CPEP
20.0000 mg | ORAL_CAPSULE | ORAL | Status: DC
  Administered 2022-10-11: 20 mg via ORAL
  Filled 2022-10-10: qty 1

## 2022-10-10 MED ORDER — MORPHINE SULFATE (PF) 2 MG/ML IV SOLN
2.0000 mg | INTRAVENOUS | Status: DC | PRN
  Administered 2022-10-10: 2 mg via INTRAVENOUS
  Filled 2022-10-10: qty 1

## 2022-10-10 MED ORDER — ONDANSETRON HCL 4 MG/2ML IJ SOLN: 4.0000 mg | Freq: Four times a day (QID) | INTRAMUSCULAR | Status: DC | PRN

## 2022-10-10 MED ORDER — FIBRIN SEALANT 2 ML SINGLE DOSE KIT
PACK | CUTANEOUS | Status: DC | PRN
  Administered 2022-10-10: 2 mL via TOPICAL

## 2022-10-10 MED ORDER — ROSUVASTATIN CALCIUM 10 MG PO TABS
20.0000 mg | ORAL_TABLET | ORAL | Status: DC
  Administered 2022-10-11: 20 mg via ORAL
  Filled 2022-10-10: qty 2

## 2022-10-10 MED ORDER — CARVEDILOL 25 MG PO TABS
25.0000 mg | ORAL_TABLET | Freq: Two times a day (BID) | ORAL | Status: DC
  Administered 2022-10-11: 25 mg via ORAL
  Filled 2022-10-10: qty 1

## 2022-10-10 MED ORDER — SPIRONOLACTONE 25 MG PO TABS
25.0000 mg | ORAL_TABLET | ORAL | Status: DC
  Administered 2022-10-11: 25 mg via ORAL
  Filled 2022-10-10: qty 1

## 2022-10-10 MED ORDER — CYCLOBENZAPRINE HCL 10 MG PO TABS
10.0000 mg | ORAL_TABLET | Freq: Three times a day (TID) | ORAL | Status: DC | PRN
  Administered 2022-10-10: 10 mg via ORAL
  Filled 2022-10-10 (×2): qty 1

## 2022-10-10 MED ORDER — SODIUM CHLORIDE 0.9 % IV SOLN: INTRAVENOUS | Status: DC

## 2022-10-10 MED ORDER — METHYLPREDNISOLONE ACETATE 40 MG/ML IJ SUSP
INTRAMUSCULAR | Status: AC
  Filled 2022-10-10: qty 1

## 2022-10-10 MED ORDER — KETOROLAC TROMETHAMINE 15 MG/ML IJ SOLN
INTRAMUSCULAR | Status: AC
  Filled 2022-10-10: qty 1

## 2022-10-10 MED ORDER — ENOXAPARIN SODIUM 40 MG/0.4ML IJ SOSY
40.0000 mg | PREFILLED_SYRINGE | INTRAMUSCULAR | Status: DC
  Administered 2022-10-11: 40 mg via SUBCUTANEOUS
  Filled 2022-10-10: qty 0.4

## 2022-10-10 MED ORDER — SENNA 8.6 MG PO TABS
1.0000 | ORAL_TABLET | Freq: Two times a day (BID) | ORAL | Status: DC
  Administered 2022-10-10 – 2022-10-11 (×2): 8.6 mg via ORAL
  Filled 2022-10-10 (×2): qty 1

## 2022-10-10 MED ORDER — PROPOFOL 10 MG/ML IV BOLUS
INTRAVENOUS | Status: DC | PRN
  Administered 2022-10-10: 120 mg via INTRAVENOUS
  Administered 2022-10-10: 20 mg via INTRAVENOUS
  Administered 2022-10-10: 30 mg via INTRAVENOUS

## 2022-10-10 MED ORDER — OXYCODONE HCL 5 MG PO TABS: 5.0000 mg | ORAL_TABLET | ORAL | Status: DC | PRN

## 2022-10-10 MED ORDER — MENTHOL 3 MG MT LOZG: 1.0000 | LOZENGE | OROMUCOSAL | Status: DC | PRN

## 2022-10-10 MED ORDER — SODIUM CHLORIDE FLUSH 0.9 % IV SOLN
INTRAVENOUS | Status: AC
  Filled 2022-10-10: qty 20

## 2022-10-10 MED ORDER — DULOXETINE HCL 30 MG PO CPEP
30.0000 mg | ORAL_CAPSULE | ORAL | Status: DC
  Administered 2022-10-11: 30 mg via ORAL
  Filled 2022-10-10: qty 1

## 2022-10-10 MED ORDER — FENTANYL CITRATE (PF) 100 MCG/2ML IJ SOLN
25.0000 ug | INTRAMUSCULAR | Status: DC | PRN
  Administered 2022-10-10 (×4): 25 ug via INTRAVENOUS

## 2022-10-10 MED ORDER — FENTANYL CITRATE (PF) 100 MCG/2ML IJ SOLN
INTRAMUSCULAR | Status: DC | PRN
  Administered 2022-10-10: 50 ug via INTRAVENOUS

## 2022-10-10 MED ORDER — VASOPRESSIN 20 UNIT/ML IV SOLN
INTRAVENOUS | Status: DC | PRN
  Administered 2022-10-10 (×3): 2 [IU] via INTRAVENOUS

## 2022-10-10 MED ORDER — REMIFENTANIL HCL 1 MG IV SOLR
INTRAVENOUS | Status: AC
  Filled 2022-10-10: qty 1000

## 2022-10-10 MED ORDER — OXYCODONE HCL 5 MG/5ML PO SOLN: 5.0000 mg | Freq: Once | ORAL | Status: AC | PRN

## 2022-10-10 MED ORDER — MIDAZOLAM HCL 2 MG/2ML IJ SOLN
INTRAMUSCULAR | Status: AC
  Filled 2022-10-10: qty 2

## 2022-10-10 MED ORDER — BUPIVACAINE HCL (PF) 0.5 % IJ SOLN
INTRAMUSCULAR | Status: AC
  Filled 2022-10-10: qty 30

## 2022-10-10 MED ORDER — STERILE WATER FOR IRRIGATION IR SOLN
Status: DC | PRN
  Administered 2022-10-10: 1000 mL

## 2022-10-10 MED ORDER — FENTANYL CITRATE (PF) 100 MCG/2ML IJ SOLN
INTRAMUSCULAR | Status: AC
  Filled 2022-10-10: qty 2

## 2022-10-10 MED ORDER — PHENYLEPHRINE HCL (PRESSORS) 10 MG/ML IV SOLN
INTRAVENOUS | Status: AC
  Filled 2022-10-10: qty 1

## 2022-10-10 MED ORDER — FIBRIN SEALANT 2 ML SINGLE DOSE KIT
PACK | CUTANEOUS | Status: AC
  Filled 2022-10-10: qty 2

## 2022-10-10 MED ORDER — ACETAMINOPHEN 10 MG/ML IV SOLN
INTRAVENOUS | Status: DC | PRN
  Administered 2022-10-10: 1000 mg via INTRAVENOUS

## 2022-10-10 MED ORDER — SODIUM CHLORIDE 0.9 % IV SOLN: 250.0000 mL | INTRAVENOUS | Status: DC

## 2022-10-10 MED ORDER — REMIFENTANIL HCL 1 MG IV SOLR
INTRAVENOUS | Status: DC | PRN
  Administered 2022-10-10: .1 ug/kg/min via INTRAVENOUS

## 2022-10-10 MED ORDER — ACETAMINOPHEN 10 MG/ML IV SOLN: 1000.0000 mg | Freq: Once | INTRAVENOUS | Status: DC | PRN

## 2022-10-10 MED ORDER — SUCCINYLCHOLINE CHLORIDE 200 MG/10ML IV SOSY
PREFILLED_SYRINGE | INTRAVENOUS | Status: DC | PRN
  Administered 2022-10-10: 120 mg via INTRAVENOUS

## 2022-10-10 MED ORDER — PANTOPRAZOLE SODIUM 40 MG PO TBEC
40.0000 mg | DELAYED_RELEASE_TABLET | ORAL | Status: DC
  Administered 2022-10-11: 40 mg via ORAL
  Filled 2022-10-10: qty 1

## 2022-10-10 MED ORDER — BUPIVACAINE LIPOSOME 1.3 % IJ SUSP
INTRAMUSCULAR | Status: AC
  Filled 2022-10-10: qty 20

## 2022-10-10 MED ORDER — OXYCODONE HCL 5 MG PO TABS
5.0000 mg | ORAL_TABLET | Freq: Once | ORAL | Status: AC | PRN
  Administered 2022-10-10: 5 mg via ORAL

## 2022-10-10 MED ORDER — CEFAZOLIN SODIUM-DEXTROSE 2-4 GM/100ML-% IV SOLN
2.0000 g | Freq: Once | INTRAVENOUS | Status: AC
  Administered 2022-10-10: 2 g via INTRAVENOUS

## 2022-10-10 MED ORDER — PHENOL 1.4 % MT LIQD: 1.0000 | OROMUCOSAL | Status: DC | PRN

## 2022-10-10 MED ORDER — ONDANSETRON HCL 4 MG PO TABS: 4.0000 mg | ORAL_TABLET | Freq: Four times a day (QID) | ORAL | Status: DC | PRN

## 2022-10-10 MED ORDER — MAGNESIUM CITRATE PO SOLN: 1.0000 | Freq: Once | ORAL | Status: DC | PRN

## 2022-10-10 MED ORDER — CEFAZOLIN SODIUM-DEXTROSE 2-4 GM/100ML-% IV SOLN
INTRAVENOUS | Status: AC
  Filled 2022-10-10: qty 100

## 2022-10-10 MED ORDER — ONDANSETRON HCL 4 MG/2ML IJ SOLN: 4.0000 mg | Freq: Once | INTRAMUSCULAR | Status: DC | PRN

## 2022-10-10 MED ORDER — ORAL CARE MOUTH RINSE: 15.0000 mL | Freq: Once | OROMUCOSAL | Status: AC

## 2022-10-10 MED ORDER — OXYCODONE HCL 5 MG PO TABS
10.0000 mg | ORAL_TABLET | ORAL | Status: DC | PRN
  Administered 2022-10-10 – 2022-10-11 (×4): 10 mg via ORAL
  Filled 2022-10-10 (×4): qty 2

## 2022-10-10 MED ORDER — PHENYLEPHRINE HCL-NACL 20-0.9 MG/250ML-% IV SOLN
INTRAVENOUS | Status: AC
  Filled 2022-10-10: qty 250

## 2022-10-10 MED ORDER — ALBUTEROL SULFATE (2.5 MG/3ML) 0.083% IN NEBU: 2.5000 mg | INHALATION_SOLUTION | Freq: Four times a day (QID) | RESPIRATORY_TRACT | Status: DC | PRN

## 2022-10-10 MED ORDER — DOCUSATE SODIUM 100 MG PO CAPS
100.0000 mg | ORAL_CAPSULE | Freq: Two times a day (BID) | ORAL | Status: DC
  Administered 2022-10-10 – 2022-10-11 (×2): 100 mg via ORAL
  Filled 2022-10-10 (×2): qty 1

## 2022-10-10 MED ORDER — SURGIFLO WITH THROMBIN (HEMOSTATIC MATRIX KIT) OPTIME
TOPICAL | Status: DC | PRN
  Administered 2022-10-10: 1 via TOPICAL

## 2022-10-10 MED ORDER — OXYCODONE HCL 5 MG PO TABS
ORAL_TABLET | ORAL | Status: AC
  Filled 2022-10-10: qty 1

## 2022-10-10 MED ORDER — BUPIVACAINE-EPINEPHRINE (PF) 0.5% -1:200000 IJ SOLN
INTRAMUSCULAR | Status: DC | PRN
  Administered 2022-10-10: 1.5 mL via PERINEURAL

## 2022-10-10 MED ORDER — EPHEDRINE SULFATE (PRESSORS) 50 MG/ML IJ SOLN
INTRAMUSCULAR | Status: DC | PRN
  Administered 2022-10-10 (×3): 5 mg via INTRAVENOUS
  Administered 2022-10-10 (×2): 10 mg via INTRAVENOUS

## 2022-10-10 MED ORDER — BUPIVACAINE-EPINEPHRINE (PF) 0.5% -1:200000 IJ SOLN
INTRAMUSCULAR | Status: AC
  Filled 2022-10-10: qty 10

## 2022-10-10 MED ORDER — ACETAMINOPHEN 10 MG/ML IV SOLN
INTRAVENOUS | Status: AC
  Filled 2022-10-10: qty 100

## 2022-10-10 MED ORDER — 0.9 % SODIUM CHLORIDE (POUR BTL) OPTIME
TOPICAL | Status: DC | PRN
  Administered 2022-10-10: 500 mL

## 2022-10-10 MED ORDER — GLYCOPYRROLATE 0.2 MG/ML IJ SOLN
INTRAMUSCULAR | Status: DC | PRN
  Administered 2022-10-10: .2 mg via INTRAVENOUS

## 2022-10-10 MED ORDER — CHLORHEXIDINE GLUCONATE 0.12 % MT SOLN
OROMUCOSAL | Status: AC
  Filled 2022-10-10: qty 15

## 2022-10-10 MED ORDER — SODIUM CHLORIDE 0.9% FLUSH
3.0000 mL | Freq: Two times a day (BID) | INTRAVENOUS | Status: DC
  Administered 2022-10-10 (×2): 3 mL via INTRAVENOUS

## 2022-10-10 MED ORDER — AMLODIPINE BESYLATE 5 MG PO TABS
5.0000 mg | ORAL_TABLET | ORAL | Status: DC
  Administered 2022-10-11: 5 mg via ORAL
  Filled 2022-10-10: qty 1

## 2022-10-10 MED ORDER — GABAPENTIN 300 MG PO CAPS
600.0000 mg | ORAL_CAPSULE | Freq: Two times a day (BID) | ORAL | Status: DC
  Administered 2022-10-10 – 2022-10-11 (×2): 600 mg via ORAL
  Filled 2022-10-10 (×2): qty 2

## 2022-10-10 SURGICAL SUPPLY — 49 items
ADH SKN CLS APL DERMABOND .7 (GAUZE/BANDAGES/DRESSINGS) ×1
AGENT HMST KT MTR STRL THRMB (HEMOSTASIS) ×1
APL PRP STRL LF DISP 70% ISPRP (MISCELLANEOUS) ×1
BASIN KIT SINGLE STR (MISCELLANEOUS) ×1 IMPLANT
BUR NEURO DRILL SOFT 3.0X3.8M (BURR) ×1 IMPLANT
CHLORAPREP W/TINT 26 (MISCELLANEOUS) ×1 IMPLANT
CNTNR URN SCR LID CUP LEK RST (MISCELLANEOUS) ×1 IMPLANT
CONT SPEC 4OZ STRL OR WHT (MISCELLANEOUS) ×1
DERMABOND ADVANCED .7 DNX12 (GAUZE/BANDAGES/DRESSINGS) ×1 IMPLANT
DRAPE C ARM PK CFD 31 SPINE (DRAPES) ×1 IMPLANT
DRAPE LAPAROTOMY 100X77 ABD (DRAPES) ×1 IMPLANT
DRAPE MICROSCOPE SPINE 48X150 (DRAPES) ×1 IMPLANT
DRAPE SURG 17X11 SM STRL (DRAPES) ×1 IMPLANT
DRSG OPSITE POSTOP 3X4 (GAUZE/BANDAGES/DRESSINGS) IMPLANT
ELECT EZSTD 165MM 6.5IN (MISCELLANEOUS)
ELECT REM PT RETURN 9FT ADLT (ELECTROSURGICAL) ×1
ELECTRODE EZSTD 165MM 6.5IN (MISCELLANEOUS) IMPLANT
ELECTRODE REM PT RTRN 9FT ADLT (ELECTROSURGICAL) ×1 IMPLANT
GLOVE BIOGEL PI IND STRL 6.5 (GLOVE) ×1 IMPLANT
GLOVE BIOGEL PI IND STRL 8.5 (GLOVE) ×2 IMPLANT
GLOVE SURG SYN 6.5 ES PF (GLOVE) ×2 IMPLANT
GLOVE SURG SYN 6.5 PF PI (GLOVE) ×2 IMPLANT
GLOVE SURG SYN 8.5  E (GLOVE) ×3
GLOVE SURG SYN 8.5 E (GLOVE) ×3 IMPLANT
GLOVE SURG SYN 8.5 PF PI (GLOVE) ×3 IMPLANT
GOWN SRG LRG LVL 4 IMPRV REINF (GOWNS) ×1 IMPLANT
GOWN SRG XL LVL 3 NONREINFORCE (GOWNS) ×1 IMPLANT
GOWN STRL NON-REIN TWL XL LVL3 (GOWNS) ×1
GOWN STRL REIN LRG LVL4 (GOWNS) ×1
GRAFT DURAGEN MATRIX 1WX1L (Tissue) IMPLANT
KIT SPINAL PRONEVIEW (KITS) ×1 IMPLANT
MANIFOLD NEPTUNE II (INSTRUMENTS) ×1 IMPLANT
MARKER SKIN DUAL TIP RULER LAB (MISCELLANEOUS) ×1 IMPLANT
NDL SAFETY ECLIP 18X1.5 (MISCELLANEOUS) ×1 IMPLANT
NS IRRIG 1000ML POUR BTL (IV SOLUTION) ×1 IMPLANT
PACK LAMINECTOMY ARMC (PACKS) ×1 IMPLANT
PAD ARMBOARD 7.5X6 YLW CONV (MISCELLANEOUS) ×1 IMPLANT
SURGIFLO W/THROMBIN 8M KIT (HEMOSTASIS) ×1 IMPLANT
SUT DVC VLOC 3-0 CL 6 P-12 (SUTURE) ×1 IMPLANT
SUT ETHILON 3-0 FS-10 30 BLK (SUTURE) ×1
SUT VIC AB 0 CT1 27 (SUTURE) ×1
SUT VIC AB 0 CT1 27XCR 8 STRN (SUTURE) ×1 IMPLANT
SUT VIC AB 2-0 CT1 18 (SUTURE) ×1 IMPLANT
SUTURE EHLN 3-0 FS-10 30 BLK (SUTURE) IMPLANT
SYR 10ML LL (SYRINGE) ×2 IMPLANT
SYR 30ML LL (SYRINGE) ×2 IMPLANT
SYR 3ML LL SCALE MARK (SYRINGE) ×1 IMPLANT
TRAP FLUID SMOKE EVACUATOR (MISCELLANEOUS) ×1 IMPLANT
WATER STERILE IRR 1000ML POUR (IV SOLUTION) ×2 IMPLANT

## 2022-10-10 NOTE — Anesthesia Postprocedure Evaluation (Signed)
Anesthesia Post Note  Patient: Susan [name]  Procedure(s) Performed: RIGHT L4-5 LAMINOFORAMINOTOMY (Right)  Patient location during evaluation: PACU Anesthesia Type: General Level of consciousness: awake and alert Pain management: pain level controlled Vital Signs Assessment: post-procedure vital signs reviewed and stable Respiratory status: spontaneous breathing, nonlabored ventilation, respiratory function stable and patient connected to nasal cannula oxygen Cardiovascular status: blood pressure returned to baseline and stable Postop Assessment: no apparent nausea or vomiting Anesthetic complications: no   No notable events documented.   Last Vitals:  Vitals:   10/10/22 1100 10/10/22 1115  BP: 118/66 128/68  Pulse: 62 61  Resp: 15 15  Temp:    SpO2: 92% 94%    Last Pain:  Vitals:   10/10/22 1030  TempSrc:   PainSc: Asleep                 Corinda Gubler

## 2022-10-10 NOTE — Transfer of Care (Signed)
Immediate Anesthesia Transfer of Care Note  Patient: Susan [name]  Procedure(s) Performed: RIGHT L4-5 LAMINOFORAMINOTOMY (Right)  Patient Location: PACU  Anesthesia Type:General  Level of Consciousness: awake, drowsy, and patient cooperative  Airway & Oxygen Therapy: Patient Spontanous Breathing and Patient connected to face mask oxygen  Post-op Assessment: Report given to RN and Post -op Vital signs reviewed and stable  Post vital signs: Reviewed and stable  Last Vitals:  Vitals Value Taken Time  BP 125/63 10/10/22 0940  Temp 36.6 C 10/10/22 0940  Pulse 71 10/10/22 0942  Resp 22 10/10/22 0942  SpO2 96 % 10/10/22 0942  Vitals shown include unvalidated device data.  Last Pain:  Vitals:   10/10/22 0639  TempSrc: Oral  PainSc: 6          Complications: No notable events documented.

## 2022-10-10 NOTE — Interval H&P Note (Signed)
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  The various methods of treatment have been discussed with the patient and family. After consideration of risks, benefits and other options for treatment, the patient has consented to  Procedure(s): RIGHT L4-5 LAMINOFORAMINOTOMY (Right) as a surgical intervention.  The patient's history has been reviewed, patient examined, no change in status, stable for surgery.  I have reviewed the patient's chart and labs.  Questions were answered to the patient's satisfaction.    Heart sounds normal no MRG. Chest Clear to Auscultation Bilaterally.   Eon Zunker

## 2022-10-10 NOTE — Discharge Instructions (Signed)
Your surgeon has performed an operation on your lumbar spine (low back) to relieve pressure on one or more nerves. Many times, patients feel better immediately after surgery and can "overdo it." Even if you feel well, it is important that you follow these activity guidelines. If you do not let your back heal properly from the surgery, you can increase the chance of a disc herniation and/or return of your symptoms. The following are instructions to help in your recovery once you have been discharged from the hospital.  * It is ok to take NSAIDs after surgery.  Activity    No bending, lifting, or twisting ("BLT"). Avoid lifting objects heavier than 10 pounds (gallon milk jug).  Where possible, avoid household activities that involve lifting, bending, pushing, or pulling such as laundry, vacuuming, grocery shopping, and childcare. Try to arrange for help from friends and family for these activities while your back heals.  Increase physical activity slowly as tolerated.  Taking short walks is encouraged, but avoid strenuous exercise. Do not jog, run, bicycle, lift weights, or participate in any other exercises unless specifically allowed by your doctor. Avoid prolonged sitting, including car rides.  Talk to your doctor before resuming sexual activity.  You should not drive until cleared by your doctor.  Until released by your doctor, you should not return to work or school.  You should rest at home and let your body heal.   You may shower three days after your surgery.  After showering, lightly dab your incision dry. Do not take a tub bath or go swimming for 3 weeks, or until approved by your doctor at your follow-up appointment.  If you smoke, we strongly recommend that you quit.  Smoking has been proven to interfere with normal healing in your back and will dramatically reduce the success rate of your surgery. Please contact [facility] (800-QUIT-NOW) and use the resources at www.[facility].com for  assistance in stopping smoking.  Surgical Incision   If you have a dressing on your incision, you may remove it three days after your surgery. Keep your incision area clean and dry.  If you have staples or stitches on your incision, you should have a follow up scheduled for removal. If you do not have staples or stitches, you will have steri-strips (small pieces of surgical tape) or Dermabond glue. The steri-strips/glue should begin to peel away within about a week (it is fine if the steri-strips fall off before then). If the strips are still in place one week after your surgery, you may gently remove them.  Diet            You may return to your usual diet. Be sure to stay hydrated.  When to Contact Us  Although your surgery and recovery will likely be uneventful, you may have some residual numbness, aches, and pains in your back and/or legs. This is normal and should improve in the next few weeks.  However, should you experience any of the following, contact us immediately: New numbness or weakness Pain that is progressively getting worse, and is not relieved by your pain medications or rest Bleeding, redness, swelling, pain, or drainage from surgical incision Chills or flu-like symptoms Fever greater than 101.0 F (38.3 C) Problems with bowel or bladder functions Difficulty breathing or shortness of breath Warmth, tenderness, or swelling in your calf  Contact Information During office hours (Monday-Friday 9 am to 5 pm), please call your physician at [phone] and ask for Kendelyn Jean After hours and   weekends, please call [phone] and speak with the neurosurgeon on call For a life-threatening emergency, call 911  

## 2022-10-10 NOTE — Anesthesia Procedure Notes (Addendum)
Procedure Name: Intubation Date/Time: 10/10/2022 7:25 AM  Performed by: Mohammed Kindle, CRNAPre-anesthesia Checklist: Patient identified, Emergency Drugs available, Suction available and Patient being monitored Patient Re-evaluated:Patient Re-evaluated prior to induction Oxygen Delivery Method: Circle system utilized Preoxygenation: Pre-oxygenation with 100% oxygen Induction Type: IV induction Ventilation: Mask ventilation without difficulty Laryngoscope Size: McGraph and 3 Grade View: Grade I Tube type: Oral Tube size: 6.5 mm Number of attempts: 1 Airway Equipment and Method: Stylet and Oral airway Placement Confirmation: ETT inserted through vocal cords under direct vision, positive ETCO2, breath sounds checked- equal and bilateral and CO2 detector Secured at: 21 cm Tube secured with: Tape Dental Injury: Teeth and Oropharynx as per pre-operative assessment  Difficulty Due To: Difficult Airway- due to dentition Comments: Atraumatic

## 2022-10-10 NOTE — Op Note (Signed)
Indications: 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 The patient tried and failed conservative management, prompting surgical intervention.  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   Preoperative Note:  Risks of surgery discussed include: infection, bleeding, stroke, coma, death, paralysis, CSF leak, nerve/spinal cord injury, numbness, tingling, weakness, complex regional pain syndrome, recurrent stenosis and/or disc herniation, vascular injury, development of instability, neck/back pain, need for further surgery, persistent symptoms, development of deformity, and the risks of anesthesia. The patient understood these risks and agreed to proceed.  Operative Note:   1. Right L4-5 laminoforaminotomy  The patient was then brought from the preoperative center with intravenous access established.  The patient underwent general anesthesia and endotracheal tube intubation, and was then rotated on the [place]calde rail top where all pressure points were appropriately padded.  The skin was then thoroughly cleansed.  Perioperative antibiotic prophylaxis was administered.  Sterile prep and drapes were then applied and a timeout was then observed.  C-arm was brought into the field under sterile conditions and under lateral visualization the L4-5 interspace was identified and marked.  The incision was marked on the right and injected with local anesthetic. Once this was complete a 3 cm incision was opened with the use of a #10 blade knife.    The metrx tubes were sequentially advanced and confirmed in position at L4-5.  An 18mm by 60mm tube was locked in place to the bed side attachment.  The microscope was then sterilely brought into the field and muscle creep was hemostased with a bipolar and resected with a pituitary rongeur.  A Bovie extender was then used to expose the spinous process and lamina.  Careful attention was placed to not violate the facet capsule. A 3 mm matchstick drill bit was then used to make a hemi-laminotomy trough until the ligamentum flavum was exposed.  This was extended to the base of the spinous process.  Once this was complete and the underlying ligamentum flavum was visualized, it was dissected with a curette and resected with Kerrison rongeurs.  Extensive ligamentum hypertrophy was noted, requiring a substantial amount of time and care for removal.    The synovial cyst was identified, and noted to be extensively adherent to the dura.  This was dissected free as carefully as possible, but removal of the synovial cyst required removal of a small amount of dura and causing a CSF leak. The nerve roots were protected during the remainder of the decompression.   The dura was identified and palpated. The kerrison rongeur was then used to remove the medial facet bilaterally until no compression was noted.  A balltip probe was used to confirm decompression of the ipsilateral L5 nerve root.   The wound was copiously irrigated. Duragen and vistaseal were used to buttress the durotomy.    The tube system was then removed under microscopic visualization and hemostasis was obtained with a bipolar.    The fascial layer was reapproximated with the use of a 0 Vicryl suture.  Subcutaneous tissue layer was reapproximated using 2-0 Vicryl suture.  3-0 nylon was used on the skin.  Patient was then rotated back to the preoperative bed awakened  from anesthesia and taken to recovery all counts are correct in this case.  I performed the entire procedure with the assistance of Susan Charity PA as an Horticulturist, commercial. An assistant was required for this procedure due to the complexity.  The assistant provided assistance in tissue manipulation and suction, and was required for the successful and safe performance of the procedure. I performed the critical portions of the procedure.   Susan Thalmann K. Myer Haff MD

## 2022-10-11 ENCOUNTER — Telehealth: Payer: Self-pay | Admitting: Neurosurgery

## 2022-10-11 ENCOUNTER — Other Ambulatory Visit: Payer: Self-pay | Admitting: Neurosurgery

## 2022-10-11 DIAGNOSIS — M5416 Radiculopathy, lumbar region: Secondary | ICD-10-CM

## 2022-10-11 MED ORDER — CELECOXIB 100 MG PO CAPS: 100.0000 mg | ORAL_CAPSULE | Freq: Two times a day (BID) | ORAL | 0 refills | Status: DC

## 2022-10-11 MED ORDER — CYCLOBENZAPRINE HCL 10 MG PO TABS: 10.0000 mg | ORAL_TABLET | Freq: Three times a day (TID) | ORAL | 0 refills | Status: DC | PRN

## 2022-10-11 MED ORDER — SENNA 8.6 MG PO TABS: 1.0000 | ORAL_TABLET | Freq: Two times a day (BID) | ORAL | 0 refills | Status: DC

## 2022-10-11 MED ORDER — OXYCODONE HCL 5 MG PO TABS: 5.0000 mg | ORAL_TABLET | ORAL | 0 refills | Status: DC | PRN

## 2022-10-11 NOTE — Evaluation (Signed)
Physical Therapy Evaluation Patient Details Name: Susan [name] MRN: [id number] DOB: [birth year] Today's Date: 10/11/2022  History of Present Illness  Susan [name] is a 62 y/o F with PMH: HTN, DM2, HLD, ETOH use, THA, PTSD, cocaine, bipolar, and L thalamic infarct (06/2022), now s/p lumbar laminoforaminotomy on 10/10/22.   Clinical Impression  Pt A&Ox4, reported 4/10 pain with movement that increased with stair training. Pt reported at baseline she is independent, and her adult son lives in her basement, and has friends/neighbors that could assist if needed.   Pt demonstrated BLE strength WFLs (except R DF, 4/5 and weaker than L, unable to specify if this was chronic issue). Pt also reported increased numbness than prior to surgery, PA notified. She demonstrated bed mobility, transfers and ambulation with supervision and RW, endorsed feeling safer and steady with RW. Stair training also performed with verbal cues and CGA. BLTs reviewed with pt as well and car transfers. Did note that the pt seemed SOB with ambulation, spO2 and HR WFLs and pt stated that this was normal for her.  Overall the patient demonstrated deficits (see "PT Problem List") that impede the patient's functional abilities, safety, and mobility and would benefit from skilled PT intervention. Recommendation is to continue skilled PT services to maximize safety and return to PLOF.        Recommendations for follow up therapy are one component of a multi-disciplinary discharge planning process, led by the attending physician.  Recommendations may be updated based on patient status, additional functional criteria and insurance authorization.  Follow Up Recommendations       Assistance Recommended at Discharge PRN  Patient can return home with the following  Assistance with cooking/housework;Assist for transportation;Help with stairs or ramp for entrance    Equipment Recommendations None recommended by PT  Recommendations for  Other Services       Functional Status Assessment       Precautions / Restrictions Precautions Precautions: Fall;Back Precaution Booklet Issued: Yes (comment) Precaution Comments: reviewed BLTs Restrictions Weight Bearing Restrictions: No      Mobility  Bed Mobility Overal bed mobility: Modified Independent                  Transfers Overall transfer level: Needs assistance Equipment used: Rolling walker (2 wheels) Transfers: Sit to/from Stand Sit to Stand: Supervision                Ambulation/Gait Ambulation/Gait assistance: Supervision Gait Distance (Feet): 120 Feet Assistive device: Rolling walker (2 wheels) Gait Pattern/deviations: Wide base of support       General Gait Details: no LOB  Stairs Stairs: Yes Stairs assistance: Min guard Stair Management: Two rails Number of Stairs: 4    Wheelchair Mobility    Modified Rankin (Stroke Patients Only)       Balance Overall balance assessment: Needs assistance Sitting-balance support: Feet supported Sitting balance-Leahy Scale: Good       Standing balance-Leahy Scale: Good                               Pertinent Vitals/Pain Pain Assessment Pain Assessment: 0-10 Pain Score: 4  Pain Location: low back Pain Descriptors / Indicators: Sore, Cramping Pain Intervention(s): Limited activity within patient's tolerance, Monitored during session, Repositioned    Home Living Family/patient expects to be discharged to:: Private residence Living Arrangements: Children Available Help at Discharge: Family;Friend(s);Available PRN/intermittently;Other (Comment) Type of Home: House Home Access: Stairs to enter  Entrance Stairs-Rails: Right;Left Entrance Stairs-Number of Steps: 5   Home Layout: Laundry or work area in basement;One level Home Equipment: Agricultural consultant (2 wheels);Cane - single point;Toilet riser;Grab bars - toilet;Grab bars - tub/shower;Shower seat;Hand held Special educational needs teacher (4 wheels)      Prior Function Prior Level of Function : Independent/Modified Independent             Mobility Comments: Indep with ADLs, household and community mobilization without assist device       Hand Dominance        Extremity/Trunk Assessment   Upper Extremity Assessment Upper Extremity Assessment: Overall WFL for tasks assessed    Lower Extremity Assessment Lower Extremity Assessment:  (LLE WFLs) RLE Deficits / Details: Pt endorsing numbness/decreased sensation to RLE, worse than normal (had CVA with residual numbness in Feb '24) strength WFLs except decresaed R foot DF, 4/5    Cervical / Trunk Assessment Cervical / Trunk Assessment: Back Surgery  Communication      Cognition Arousal/Alertness: Awake/alert Behavior During Therapy: WFL for tasks assessed/performed Overall Cognitive Status: Within Functional Limits for tasks assessed                                          General Comments      Exercises     Assessment/Plan    PT Assessment Patient needs continued PT services  PT Problem List Decreased strength;Decreased mobility;Decreased range of motion;Decreased activity tolerance;Decreased balance;Decreased knowledge of use of DME;Pain       PT Treatment Interventions DME instruction;Therapeutic activities;Gait training;Therapeutic exercise;Patient/family education;Stair training;Balance training;Functional mobility training;Neuromuscular re-education    PT Goals (Current goals can be found in the Care Plan section)  Acute Rehab PT Goals Patient Stated Goal: to go home PT Goal Formulation: With patient Time For Goal Achievement: 10/25/22 Potential to Achieve Goals: Good    Frequency 7X/week     Co-evaluation               AM-PAC PT "6 Clicks" Mobility  Outcome Measure Help needed turning from your back to your side while in a flat bed without using bedrails?: None Help needed moving from  lying on your back to sitting on the side of a flat bed without using bedrails?: None Help needed moving to and from a bed to a chair (including a wheelchair)?: None Help needed standing up from a chair using your arms (e.g., wheelchair or bedside chair)?: None Help needed to walk in hospital room?: None Help needed climbing 3-5 steps with a railing? : A Little 6 Click Score: 23    End of Session Equipment Utilized During Treatment: Gait belt Activity Tolerance: Patient tolerated treatment well Patient left: in chair;with call bell/phone within reach Nurse Communication: Mobility status PT Visit Diagnosis: Other abnormalities of gait and mobility (R26.89);Muscle weakness (generalized) (M62.81);Pain Pain - Right/Left:  (midline) Pain - part of body:  (back)    Time: 1610-9604 PT Time Calculation (min) (ACUTE ONLY): 19 min   Charges:   PT Evaluation $PT Eval Low Complexity: 1 Low PT Treatments $Therapeutic Activity: 8-22 mins        Olga Coaster PT, DPT 11:16 AM,10/11/22

## 2022-10-11 NOTE — Discharge Summary (Signed)
Discharge Summary  Patient ID: Susan [name] MRN: [id number] DOB/AGE: 1961-04-19 62 y.o.  Admit date: 10/10/2022 Discharge date: 10/11/2022  Admission Diagnoses:  M54.16 lumbar radiculopathy, M71.38 synovial cyst of lumbar facet joint  Discharge Diagnoses:  Principal Problem:   S/P laminectomy Active Problems:   Synovial cyst of lumbar facet joint   Lumbar radiculopathy   Discharged Condition: good  Hospital Course:  Susan [name] is a 62 year old female presenting with lumbar radiculopathy status post right L4-5 laminoforaminotomy.  Her intraoperative course was complicated by a CSF leak which was repaired intraoperatively.  She remained flat for 12 hours postop and was without any headache or drainage from her incision.  She was seen by physical therapy and cleared for discharge home on postop day 1.  She reported improvement of her right leg pain and her postoperative pain was well-controlled with oral medications.  She was given a prescription for oxycodone, muscle laxer, and stool softener.  Consults: None  Significant Diagnostic Studies: None  Treatments: surgery: As above.  Please see separately dictated operative report for further details.  Discharge Exam: Blood pressure (!) 140/74, pulse 67, temperature 98.6 F (37 C), resp. rate 18, height 5\' 2"  (1.575 m), weight 96.1 kg, SpO2 94 %.  AA Ox3 CNI   Strength:5/5 throughout BLE Incision c/d/I with sutures in place  Disposition: Discharge disposition: 01-Home or Self Care       Discharge Instructions     Incentive spirometry RT   Complete by: As directed       Allergies as of 10/11/2022       Reactions   Wellbutrin [bupropion]    Patient reports made " deathly sick " years ago at appointment 03/18/20.   Lasix [furosemide] Other (See Comments)   Electrolyte imbalance        Medication List     STOP taking these medications    HYDROcodone-acetaminophen 5-325 MG tablet Commonly known as:  NORCO/VICODIN       TAKE these medications    acetaminophen 500 MG tablet Commonly known as: TYLENOL Take 1,000 mg by mouth every morning.   albuterol 108 (90 Base) MCG/ACT inhaler Commonly known as: VENTOLIN HFA Inhale 2 puffs into the lungs every 6 (six) hours as needed for wheezing or shortness of breath.   amLODipine 5 MG tablet Commonly known as: NORVASC Take 1 tablet (5 mg total) by mouth daily. What changed: when to take this   aspirin EC 81 MG tablet Take 1 tablet (81 mg total) by mouth daily. Swallow whole.   carvedilol 25 MG tablet Commonly known as: COREG TAKE 1 TABLET BY MOUTH TWICE DAILY WITH MEAL. MONITOR HEART RATE What changed:  how much to take how to take this when to take this   clotrimazole 1 % cream Commonly known as: LOTRIMIN Apply 1 application topically 2 (two) times daily as needed.   cyclobenzaprine 10 MG tablet Commonly known as: FLEXERIL Take 1 tablet (10 mg total) by mouth 3 (three) times daily as needed for muscle spasms. What changed:  medication strength when to take this reasons to take this   DULoxetine 20 MG capsule Commonly known as: CYMBALTA TAKE 1 CAPSULE BY MOUTH EVERY DAY WITH 30 MG CAPSULE What changed:  how much to take when to take this   DULoxetine 30 MG capsule Commonly known as: CYMBALTA Take 1 capsule (30 mg total) by mouth daily. Take daily with 20 mg cap. What changed: when to take this   gabapentin 300 MG capsule  Commonly known as: NEURONTIN Take 600 mg by mouth 2 (two) times daily.   naloxone 4 MG/0.1ML Liqd nasal spray kit Commonly known as: NARCAN Place 0.4 mg into the nose once.   oxyCODONE 5 MG immediate release tablet Commonly known as: Oxy IR/ROXICODONE Take 1 tablet (5 mg total) by mouth every 3 (three) hours as needed for moderate pain ((score 4 to 6)).   Ozempic (1 MG/DOSE) 4 MG/3ML Sopn Generic drug: Semaglutide (1 MG/DOSE) INJECT 1MG  UNDER THE SKIN (SUBCUTANEOUSLY) ONCE WEEKLY ON  SUNDAY What changed: See the new instructions.   pantoprazole 40 MG tablet Commonly known as: PROTONIX TAKE 1 TABLET(40 MG) BY MOUTH DAILY What changed: See the new instructions.   rosuvastatin 20 MG tablet Commonly known as: Crestor Take 1 tablet (20 mg total) by mouth daily. What changed: when to take this   senna 8.6 MG Tabs tablet Commonly known as: SENOKOT Take 1 tablet (8.6 mg total) by mouth 2 (two) times daily.   spironolactone 25 MG tablet Commonly known as: ALDACTONE Take 1 tablet (25 mg total) by mouth daily. What changed: when to take this   sulfamethoxazole-trimethoprim 800-160 MG tablet Commonly known as: BACTRIM DS Take 1 tablet by mouth 2 (two) times daily for 5 days. Increase water intake while taking this medication.   telmisartan 80 MG tablet Commonly known as: MICARDIS TAKE 1 TABLET(80 MG) BY MOUTH DAILY What changed: See the new instructions.   traZODone 50 MG tablet Commonly known as: DESYREL Take 1 tablet (50 mg total) by mouth at bedtime.   Vitamin D (Ergocalciferol) 1.25 MG (50000 UNIT) Caps capsule Commonly known as: DRISDOL NEW PRESCRIPTION REQUEST: TAKE ONE CAPSULE BY MOUTH EVERY WEEK What changed: See the new instructions.         Signed: [name] Borders 10/11/2022, 9:11 AM

## 2022-10-11 NOTE — Evaluation (Signed)
Occupational Therapy Evaluation Patient Details Name: Susan [name] MRN: [id number] DOB: 1960/07/13 Today's Date: 10/11/2022   History of Present Illness Susan [name] is a 62 y/o F with PMH: HTN, DM2, HLD, ETOH use, THA, PTSD, cocaine, bipolar, and L thalamic infarct (06/2022), now s/p lumbar laminectomy on 10/10/22.   Clinical Impression   Pt was seen for OT evaluation this date. Prior to hospital admission, pt was independent. Pt has family member present and notes assist available at discharge. Pt eager to discharge promptly. Pt presents to acute OT demonstrating impaired ADL performance and functional mobility 2/2 decreased strength, RLE numbness (questionable as to extend new versus from old CVA), and decreased knowledge of precautions (See OT problem list for additional functional deficits). Pt currently requires PRN MINA for LB ADL tasks and SUPV-CGA for ADL mobility. Pt/family instructed in home/routines modifications, guidelines for movement following back surgery, AE/DME, and falls prevention strategies. Pt notes going on cruise in August and OT facilitated problem solving for areas of concern. Pt verbalized understanding of all instruction provided. No additional acute OT needs at this time. Will sign off.     Recommendations for follow up therapy are one component of a multi-disciplinary discharge planning process, led by the attending physician.  Recommendations may be updated based on patient status, additional functional criteria and insurance authorization.   Assistance Recommended at Discharge Intermittent Supervision/Assistance  Patient can return home with the following A little help with walking and/or transfers;A little help with bathing/dressing/bathroom;Assistance with cooking/housework;Assist for transportation;Help with stairs or ramp for entrance    Functional Status Assessment  Patient has had a recent decline in their functional status and demonstrates the ability to  make significant improvements in function in a reasonable and predictable amount of time.  Equipment Recommendations  Other (comment) Lexicographer)    Recommendations for Other Services       Precautions / Restrictions Precautions Precautions: Fall;Back Precaution Booklet Issued: Yes (comment) Restrictions Weight Bearing Restrictions: No      Mobility Bed Mobility               General bed mobility comments: NT in recliner    Transfers Overall transfer level: Needs assistance Equipment used: Rolling walker (2 wheels) Transfers: Sit to/from Stand Sit to Stand: Supervision                  Balance                                           ADL either performed or assessed with clinical judgement   ADL                                         General ADL Comments: Pt able to manage UB and LB ADL without direct assist, does tend to bend forward more than desireable. Denies pain throughout. Pt educated in adaptive equipment and strategies to manage ADL during recovery. HAndout provided.     Vision         Perception     Praxis      Pertinent Vitals/Pain Pain Assessment Pain Assessment: No/denies pain     Hand Dominance     Extremity/Trunk Assessment Upper Extremity Assessment Upper Extremity Assessment: Overall WFL for tasks assessed   Lower Extremity Assessment  Lower Extremity Assessment: RLE deficits/detail;Defer to PT evaluation RLE Deficits / Details: Pt endorsing numbness/decreased sensation to RLE, worse than normal (had CVA with residual numbness in Feb '24)   Cervical / Trunk Assessment Cervical / Trunk Assessment: Back Surgery   Communication     Cognition Arousal/Alertness: Awake/alert Behavior During Therapy: WFL for tasks assessed/performed Overall Cognitive Status: Within Functional Limits for tasks assessed                                       General Comments        Exercises     Shoulder Instructions      Home Living Family/patient expects to be discharged to:: Private residence Living Arrangements: Children                                      Prior Functioning/Environment                          OT Problem List: Impaired sensation;Decreased knowledge of precautions      OT Treatment/Interventions:      OT Goals(Current goals can be found in the care plan section) Acute Rehab OT Goals Patient Stated Goal: go home OT Goal Formulation: All assessment and education complete, DC therapy  OT Frequency:      Co-evaluation              AM-PAC OT "6 Clicks" Daily Activity     Outcome Measure Help from another person eating meals?: None Help from another person taking care of personal grooming?: None Help from another person toileting, which includes using toliet, bedpan, or urinal?: None Help from another person bathing (including washing, rinsing, drying)?: A Little Help from another person to put on and taking off regular upper body clothing?: None Help from another person to put on and taking off regular lower body clothing?: None 6 Click Score: 23   End of Session    Activity Tolerance: Patient tolerated treatment well Patient left: in chair;with call bell/phone within reach;with family/visitor present;with nursing/sitter in room  OT Visit Diagnosis: Other abnormalities of gait and mobility (R26.89)                Time: 8295-6213 OT Time Calculation (min): 25 min Charges:  OT General Charges $OT Visit: 1 Visit OT Evaluation $OT Eval Low Complexity: 1 Low OT Treatments $Self Care/Home Management : 8-22 mins  Arman Filter., MPH, MS, OTR/L ascom [phone] 10/11/22, 11:02 AM

## 2022-10-11 NOTE — Progress Notes (Signed)
   Neurosurgery Progress Note  History: Susan [name] is s/p right L4-5 laminoforaminotomy  POD1: Denies headache. Reports improved leg pain. Is eager to go home  Physical Exam: Vitals:   10/11/22 0403 10/11/22 0721  BP: (!) 132/58 (!) 140/74  Pulse: 71 67  Resp: 20 18  Temp: 98.9 F (37.2 C) 98.6 F (37 C)  SpO2: 93% 94%    AA Ox3 CNI  Strength:5/5 throughout BLE Incision c/d/I with sutures in place  Data:  Other tests/results: none  Assessment/Plan:  Susan [name] is a pleasant 62 y.o s/p lumber decompression for synovial cyst resection complicated by an interoperative CSF leak. She reports improvement  - mobilize - pain control - DVT prophylaxis - PTOT; will plan to d/c home if cleared by therapy  Manning Charity PA-C Department of Neurosurgery

## 2022-10-11 NOTE — Telephone Encounter (Signed)
I spoke with Susan [name].  She is having increased numbness in her right leg.  I reassured her that this is likely due to nerve manipulation during surgery and should improve.  We discussed concerns for sign of stroke as this was her main concern.  I do not feel she is having a CVA at this time given that her only symptom is increased numbness in the distribution of her surgery.  She was given strict return precautions.  She will call us if she has any additional questions or concerns.

## 2022-10-11 NOTE — TOC CM/SW Note (Signed)
Patient left before CSW could meet with her. CSW left her a voicemail to see if she wants home health physical therapy. Neurosurgery PA is aware and will enter HHPT order in case she wants it but she said she thinks the patient told their office she did not want services.  Charlynn Court, CSW [phone]

## 2022-10-12 ENCOUNTER — Emergency Department: Payer: 59

## 2022-10-12 ENCOUNTER — Emergency Department: Payer: 59 | Admitting: Registered Nurse

## 2022-10-12 ENCOUNTER — Telehealth: Payer: Self-pay | Admitting: Orthopedic Surgery

## 2022-10-12 ENCOUNTER — Other Ambulatory Visit: Payer: Self-pay | Admitting: Family Medicine

## 2022-10-12 ENCOUNTER — Other Ambulatory Visit: Payer: Self-pay

## 2022-10-12 ENCOUNTER — Encounter
Admission: EM | Disposition: A | Discharge status: Home Health | Payer: Self-pay | Source: Home / Self Care | Attending: Neurosurgery

## 2022-10-12 ENCOUNTER — Inpatient Hospital Stay
Admission: EM | Admit: 2022-10-12 | Discharge: 2022-10-19 | DRG: 982 | Disposition: A | Discharge status: Home Health | Payer: 59 | Attending: Neurosurgery | Admitting: Neurosurgery

## 2022-10-12 DIAGNOSIS — I10 Essential (primary) hypertension: Secondary | ICD-10-CM | POA: Diagnosis present

## 2022-10-12 DIAGNOSIS — G834 Cauda equina syndrome: Secondary | ICD-10-CM | POA: Diagnosis present

## 2022-10-12 DIAGNOSIS — M810 Age-related osteoporosis without current pathological fracture: Secondary | ICD-10-CM | POA: Diagnosis present

## 2022-10-12 DIAGNOSIS — Z9151 Personal history of suicidal behavior: Secondary | ICD-10-CM

## 2022-10-12 DIAGNOSIS — Z8673 Personal history of transient ischemic attack (TIA), and cerebral infarction without residual deficits: Secondary | ICD-10-CM

## 2022-10-12 DIAGNOSIS — S064XAA Epidural hemorrhage with loss of consciousness status unknown, initial encounter: Secondary | ICD-10-CM | POA: Diagnosis present

## 2022-10-12 DIAGNOSIS — M7138 Other bursal cyst, other site: Secondary | ICD-10-CM | POA: Diagnosis present

## 2022-10-12 DIAGNOSIS — Z818 Family history of other mental and behavioral disorders: Secondary | ICD-10-CM

## 2022-10-12 DIAGNOSIS — R338 Other retention of urine: Secondary | ICD-10-CM | POA: Diagnosis present

## 2022-10-12 DIAGNOSIS — M069 Rheumatoid arthritis, unspecified: Secondary | ICD-10-CM | POA: Diagnosis present

## 2022-10-12 DIAGNOSIS — E039 Hypothyroidism, unspecified: Secondary | ICD-10-CM | POA: Diagnosis present

## 2022-10-12 DIAGNOSIS — E785 Hyperlipidemia, unspecified: Secondary | ICD-10-CM | POA: Diagnosis present

## 2022-10-12 DIAGNOSIS — J9601 Acute respiratory failure with hypoxia: Secondary | ICD-10-CM | POA: Diagnosis not present

## 2022-10-12 DIAGNOSIS — F319 Bipolar disorder, unspecified: Secondary | ICD-10-CM | POA: Diagnosis present

## 2022-10-12 DIAGNOSIS — R531 Weakness: Secondary | ICD-10-CM | POA: Diagnosis not present

## 2022-10-12 DIAGNOSIS — J432 Centrilobular emphysema: Secondary | ICD-10-CM | POA: Diagnosis present

## 2022-10-12 DIAGNOSIS — Z7985 Long-term (current) use of injectable non-insulin antidiabetic drugs: Secondary | ICD-10-CM

## 2022-10-12 DIAGNOSIS — D649 Anemia, unspecified: Secondary | ICD-10-CM | POA: Diagnosis present

## 2022-10-12 DIAGNOSIS — Z6838 Body mass index (BMI) 38.0-38.9, adult: Secondary | ICD-10-CM | POA: Diagnosis not present

## 2022-10-12 DIAGNOSIS — E119 Type 2 diabetes mellitus without complications: Secondary | ICD-10-CM | POA: Diagnosis present

## 2022-10-12 DIAGNOSIS — N200 Calculus of kidney: Secondary | ICD-10-CM | POA: Diagnosis present

## 2022-10-12 DIAGNOSIS — R296 Repeated falls: Secondary | ICD-10-CM | POA: Diagnosis present

## 2022-10-12 DIAGNOSIS — G9761 Postprocedural hematoma of a nervous system organ or structure following a nervous system procedure: Secondary | ICD-10-CM | POA: Diagnosis present

## 2022-10-12 DIAGNOSIS — E669 Obesity, unspecified: Secondary | ICD-10-CM | POA: Diagnosis present

## 2022-10-12 DIAGNOSIS — Z9889 Other specified postprocedural states: Secondary | ICD-10-CM

## 2022-10-12 DIAGNOSIS — R339 Retention of urine, unspecified: Secondary | ICD-10-CM

## 2022-10-12 DIAGNOSIS — Z9851 Tubal ligation status: Secondary | ICD-10-CM

## 2022-10-12 DIAGNOSIS — Z96651 Presence of right artificial knee joint: Secondary | ICD-10-CM | POA: Diagnosis present

## 2022-10-12 DIAGNOSIS — Z807 Family history of other malignant neoplasms of lymphoid, hematopoietic and related tissues: Secondary | ICD-10-CM

## 2022-10-12 DIAGNOSIS — Z8349 Family history of other endocrine, nutritional and metabolic diseases: Secondary | ICD-10-CM

## 2022-10-12 DIAGNOSIS — J95821 Acute postprocedural respiratory failure: Secondary | ICD-10-CM | POA: Diagnosis present

## 2022-10-12 DIAGNOSIS — J4489 Other specified chronic obstructive pulmonary disease: Secondary | ICD-10-CM | POA: Diagnosis present

## 2022-10-12 DIAGNOSIS — M5416 Radiculopathy, lumbar region: Secondary | ICD-10-CM | POA: Diagnosis present

## 2022-10-12 DIAGNOSIS — F17209 Nicotine dependence, unspecified, with unspecified nicotine-induced disorders: Secondary | ICD-10-CM | POA: Diagnosis present

## 2022-10-12 DIAGNOSIS — E559 Vitamin D deficiency, unspecified: Secondary | ICD-10-CM | POA: Diagnosis present

## 2022-10-12 DIAGNOSIS — M1731 Unilateral post-traumatic osteoarthritis, right knee: Secondary | ICD-10-CM | POA: Diagnosis present

## 2022-10-12 DIAGNOSIS — Z79899 Other long term (current) drug therapy: Secondary | ICD-10-CM

## 2022-10-12 DIAGNOSIS — F17200 Nicotine dependence, unspecified, uncomplicated: Secondary | ICD-10-CM | POA: Diagnosis present

## 2022-10-12 DIAGNOSIS — F1421 Cocaine dependence, in remission: Secondary | ICD-10-CM | POA: Diagnosis present

## 2022-10-12 DIAGNOSIS — I119 Hypertensive heart disease without heart failure: Secondary | ICD-10-CM | POA: Diagnosis present

## 2022-10-12 DIAGNOSIS — R0602 Shortness of breath: Secondary | ICD-10-CM | POA: Diagnosis not present

## 2022-10-12 DIAGNOSIS — Z888 Allergy status to other drugs, medicaments and biological substances status: Secondary | ICD-10-CM

## 2022-10-12 DIAGNOSIS — Z96642 Presence of left artificial hip joint: Secondary | ICD-10-CM | POA: Diagnosis present

## 2022-10-12 DIAGNOSIS — M419 Scoliosis, unspecified: Secondary | ICD-10-CM | POA: Diagnosis present

## 2022-10-12 DIAGNOSIS — Z90722 Acquired absence of ovaries, bilateral: Secondary | ICD-10-CM

## 2022-10-12 DIAGNOSIS — Z8249 Family history of ischemic heart disease and other diseases of the circulatory system: Secondary | ICD-10-CM

## 2022-10-12 DIAGNOSIS — G4733 Obstructive sleep apnea (adult) (pediatric): Secondary | ICD-10-CM | POA: Diagnosis present

## 2022-10-12 DIAGNOSIS — Z825 Family history of asthma and other chronic lower respiratory diseases: Secondary | ICD-10-CM

## 2022-10-12 DIAGNOSIS — Z8261 Family history of arthritis: Secondary | ICD-10-CM

## 2022-10-12 DIAGNOSIS — F17210 Nicotine dependence, cigarettes, uncomplicated: Secondary | ICD-10-CM | POA: Diagnosis present

## 2022-10-12 DIAGNOSIS — G9600 Cerebrospinal fluid leak, unspecified: Secondary | ICD-10-CM | POA: Diagnosis present

## 2022-10-12 DIAGNOSIS — Z811 Family history of alcohol abuse and dependence: Secondary | ICD-10-CM

## 2022-10-12 DIAGNOSIS — K219 Gastro-esophageal reflux disease without esophagitis: Secondary | ICD-10-CM | POA: Diagnosis present

## 2022-10-12 DIAGNOSIS — J9811 Atelectasis: Secondary | ICD-10-CM | POA: Diagnosis not present

## 2022-10-12 DIAGNOSIS — Z8619 Personal history of other infectious and parasitic diseases: Secondary | ICD-10-CM

## 2022-10-12 DIAGNOSIS — G96198 Other disorders of meninges, not elsewhere classified: Secondary | ICD-10-CM

## 2022-10-12 DIAGNOSIS — Z813 Family history of other psychoactive substance abuse and dependence: Secondary | ICD-10-CM

## 2022-10-12 DIAGNOSIS — Z8614 Personal history of Methicillin resistant Staphylococcus aureus infection: Secondary | ICD-10-CM

## 2022-10-12 DIAGNOSIS — Z9079 Acquired absence of other genital organ(s): Secondary | ICD-10-CM

## 2022-10-12 HISTORY — PX: LUMBAR LAMINECTOMY/ DECOMPRESSION WITH MET-RX: SHX5959

## 2022-10-12 LAB — COMPREHENSIVE METABOLIC PANEL
ALT: 10 U/L (ref 0–44)
AST: 22 U/L (ref 15–41)
Albumin: 3.7 g/dL (ref 3.5–5.0)
Alkaline Phosphatase: 68 U/L (ref 38–126)
Anion gap: 8 (ref 5–15)
BUN: 17 mg/dL (ref 8–23)
CO2: 21 mmol/L — ABNORMAL LOW (ref 22–32)
Calcium: 8.6 mg/dL — ABNORMAL LOW (ref 8.9–10.3)
Chloride: 102 mmol/L (ref 98–111)
Creatinine, Ser: 1.15 mg/dL — ABNORMAL HIGH (ref 0.44–1.00)
GFR, Estimated: 54 mL/min — ABNORMAL LOW (ref >60)
Glucose, Bld: 93 mg/dL (ref 70–99)
Potassium: 4.2 mmol/L (ref 3.5–5.1)
Sodium: 131 mmol/L — ABNORMAL LOW (ref 135–145)
Total Bilirubin: 0.8 mg/dL (ref 0.3–1.2)
Total Protein: 6.8 g/dL (ref 6.5–8.1)

## 2022-10-12 LAB — URINALYSIS, COMPLETE (UACMP) WITH MICROSCOPIC
Bilirubin Urine: NEGATIVE
Glucose, UA: NEGATIVE mg/dL
Hgb urine dipstick: NEGATIVE
Ketones, ur: NEGATIVE mg/dL
Leukocytes,Ua: NEGATIVE
Nitrite: NEGATIVE
Protein, ur: NEGATIVE mg/dL
Specific Gravity, Urine: 1.004 — ABNORMAL LOW (ref 1.005–1.030)
pH: 6 (ref 5.0–8.0)

## 2022-10-12 LAB — CBC
HCT: 32.1 % — ABNORMAL LOW (ref 36.0–46.0)
Hemoglobin: 10.7 g/dL — ABNORMAL LOW (ref 12.0–15.0)
MCH: 32.3 pg (ref 26.0–34.0)
MCHC: 33.3 g/dL (ref 30.0–36.0)
MCV: 97 fL (ref 80.0–100.0)
Platelets: 163 10*3/uL (ref 150–400)
RBC: 3.31 MIL/uL — ABNORMAL LOW (ref 3.87–5.11)
RDW: 12.5 % (ref 11.5–15.5)
WBC: 11.8 10*3/uL — ABNORMAL HIGH (ref 4.0–10.5)
nRBC: 0 % (ref 0.0–0.2)

## 2022-10-12 LAB — CBG MONITORING, ED: Glucose-Capillary: 107 mg/dL — ABNORMAL HIGH (ref 70–99)

## 2022-10-12 SURGERY — LUMBAR LAMINECTOMY/ DECOMPRESSION WITH MET-RX
Anesthesia: General

## 2022-10-12 MED ORDER — SODIUM CHLORIDE 0.9 % IV SOLN: INTRAVENOUS | Status: DC

## 2022-10-12 MED ORDER — FENTANYL CITRATE (PF) 100 MCG/2ML IJ SOLN
INTRAMUSCULAR | Status: DC | PRN
  Administered 2022-10-12: 50 ug via INTRAVENOUS

## 2022-10-12 MED ORDER — PHENOL 1.4 % MT LIQD: 1.0000 | OROMUCOSAL | Status: DC | PRN

## 2022-10-12 MED ORDER — PHENYLEPHRINE HCL-NACL 20-0.9 MG/250ML-% IV SOLN
INTRAVENOUS | Status: DC | PRN
  Administered 2022-10-12: 30 ug/min via INTRAVENOUS

## 2022-10-12 MED ORDER — GABAPENTIN 300 MG PO CAPS
600.0000 mg | ORAL_CAPSULE | Freq: Two times a day (BID) | ORAL | Status: DC
  Administered 2022-10-13 – 2022-10-19 (×13): 600 mg via ORAL
  Filled 2022-10-12 (×14): qty 2

## 2022-10-12 MED ORDER — DEXMEDETOMIDINE HCL IN NACL 80 MCG/20ML IV SOLN
INTRAVENOUS | Status: DC | PRN
  Administered 2022-10-12: 8 ug via INTRAVENOUS

## 2022-10-12 MED ORDER — PHENYLEPHRINE HCL (PRESSORS) 10 MG/ML IV SOLN
INTRAVENOUS | Status: DC | PRN
  Administered 2022-10-12: 160 ug via INTRAVENOUS

## 2022-10-12 MED ORDER — SEMAGLUTIDE (1 MG/DOSE) 4 MG/3ML ~~LOC~~ SOPN: 1.0000 mg | PEN_INJECTOR | SUBCUTANEOUS | Status: DC

## 2022-10-12 MED ORDER — SUCCINYLCHOLINE CHLORIDE 200 MG/10ML IV SOSY
PREFILLED_SYRINGE | INTRAVENOUS | Status: DC | PRN
  Administered 2022-10-12: 120 mg via INTRAVENOUS

## 2022-10-12 MED ORDER — ONDANSETRON HCL 4 MG PO TABS: 4.0000 mg | ORAL_TABLET | Freq: Four times a day (QID) | ORAL | Status: DC | PRN

## 2022-10-12 MED ORDER — SUGAMMADEX SODIUM 200 MG/2ML IV SOLN
INTRAVENOUS | Status: DC | PRN
  Administered 2022-10-12: 200 mg via INTRAVENOUS

## 2022-10-12 MED ORDER — SODIUM CHLORIDE 0.9 % IV SOLN: 250.0000 mL | INTRAVENOUS | Status: DC

## 2022-10-12 MED ORDER — HYDROMORPHONE HCL 1 MG/ML IJ SOLN: 0.5000 mg | INTRAMUSCULAR | Status: AC | PRN

## 2022-10-12 MED ORDER — SENNA 8.6 MG PO TABS
1.0000 | ORAL_TABLET | Freq: Two times a day (BID) | ORAL | Status: DC
  Administered 2022-10-13 – 2022-10-18 (×10): 8.6 mg via ORAL
  Filled 2022-10-12 (×11): qty 1

## 2022-10-12 MED ORDER — ACETAMINOPHEN 650 MG RE SUPP: 650.0000 mg | RECTAL | Status: DC | PRN

## 2022-10-12 MED ORDER — EPHEDRINE SULFATE (PRESSORS) 50 MG/ML IJ SOLN
INTRAMUSCULAR | Status: DC | PRN
  Administered 2022-10-12: 5 mg via INTRAVENOUS
  Administered 2022-10-12 (×2): 10 mg via INTRAVENOUS

## 2022-10-12 MED ORDER — DULOXETINE HCL 20 MG PO CPEP
20.0000 mg | ORAL_CAPSULE | Freq: Every day | ORAL | Status: DC
  Administered 2022-10-13 – 2022-10-19 (×7): 20 mg via ORAL
  Filled 2022-10-12 (×7): qty 1

## 2022-10-12 MED ORDER — FENTANYL CITRATE (PF) 100 MCG/2ML IJ SOLN
25.0000 ug | INTRAMUSCULAR | Status: DC | PRN
  Administered 2022-10-12 (×2): 50 ug via INTRAVENOUS

## 2022-10-12 MED ORDER — CELECOXIB 100 MG PO CAPS
100.0000 mg | ORAL_CAPSULE | Freq: Two times a day (BID) | ORAL | Status: DC
  Administered 2022-10-13 – 2022-10-19 (×13): 100 mg via ORAL
  Filled 2022-10-12 (×14): qty 1

## 2022-10-12 MED ORDER — DROPERIDOL 2.5 MG/ML IJ SOLN: 0.6250 mg | Freq: Once | INTRAMUSCULAR | Status: DC | PRN

## 2022-10-12 MED ORDER — FLEET ENEMA 7-19 GM/118ML RE ENEM
1.0000 | ENEMA | Freq: Once | RECTAL | Status: AC | PRN
  Administered 2022-10-15: 1 via RECTAL

## 2022-10-12 MED ORDER — LIDOCAINE HCL (CARDIAC) PF 100 MG/5ML IV SOSY
PREFILLED_SYRINGE | INTRAVENOUS | Status: DC | PRN
  Administered 2022-10-12: 80 mg via INTRAVENOUS

## 2022-10-12 MED ORDER — ALBUTEROL SULFATE (2.5 MG/3ML) 0.083% IN NEBU: 2.5000 mg | INHALATION_SOLUTION | Freq: Four times a day (QID) | RESPIRATORY_TRACT | Status: DC | PRN

## 2022-10-12 MED ORDER — ONDANSETRON HCL 4 MG/2ML IJ SOLN: 4.0000 mg | Freq: Four times a day (QID) | INTRAMUSCULAR | Status: DC | PRN

## 2022-10-12 MED ORDER — BISACODYL 5 MG PO TBEC
5.0000 mg | DELAYED_RELEASE_TABLET | Freq: Every day | ORAL | Status: DC | PRN
  Administered 2022-10-14 – 2022-10-15 (×2): 5 mg via ORAL
  Filled 2022-10-12 (×3): qty 1

## 2022-10-12 MED ORDER — ONDANSETRON HCL 4 MG/2ML IJ SOLN
INTRAMUSCULAR | Status: DC | PRN
  Administered 2022-10-12: 4 mg via INTRAVENOUS

## 2022-10-12 MED ORDER — ROSUVASTATIN CALCIUM 10 MG PO TABS
20.0000 mg | ORAL_TABLET | Freq: Every day | ORAL | Status: DC
  Administered 2022-10-13 – 2022-10-19 (×7): 20 mg via ORAL
  Filled 2022-10-12 (×8): qty 2

## 2022-10-12 MED ORDER — ACETAMINOPHEN 325 MG PO TABS
650.0000 mg | ORAL_TABLET | ORAL | Status: DC | PRN
  Administered 2022-10-13 – 2022-10-16 (×2): 650 mg via ORAL
  Filled 2022-10-12 (×2): qty 2

## 2022-10-12 MED ORDER — VASOPRESSIN 20 UNIT/ML IV SOLN
INTRAVENOUS | Status: AC
  Filled 2022-10-12: qty 1

## 2022-10-12 MED ORDER — CARVEDILOL 25 MG PO TABS
25.0000 mg | ORAL_TABLET | Freq: Two times a day (BID) | ORAL | Status: DC
  Administered 2022-10-13 – 2022-10-16 (×7): 25 mg via ORAL
  Filled 2022-10-12 (×7): qty 1

## 2022-10-12 MED ORDER — DEXAMETHASONE SODIUM PHOSPHATE 10 MG/ML IJ SOLN
INTRAMUSCULAR | Status: AC
  Filled 2022-10-12: qty 7

## 2022-10-12 MED ORDER — CYCLOBENZAPRINE HCL 10 MG PO TABS
10.0000 mg | ORAL_TABLET | Freq: Three times a day (TID) | ORAL | Status: DC | PRN
  Administered 2022-10-14 – 2022-10-18 (×4): 10 mg via ORAL
  Filled 2022-10-12 (×5): qty 1

## 2022-10-12 MED ORDER — ROCURONIUM BROMIDE 100 MG/10ML IV SOLN
INTRAVENOUS | Status: DC | PRN
  Administered 2022-10-12: 5 mg via INTRAVENOUS
  Administered 2022-10-12: 10 mg via INTRAVENOUS
  Administered 2022-10-12: 20 mg via INTRAVENOUS

## 2022-10-12 MED ORDER — DULOXETINE HCL 30 MG PO CPEP
30.0000 mg | ORAL_CAPSULE | Freq: Every day | ORAL | Status: DC
  Administered 2022-10-13 – 2022-10-19 (×7): 30 mg via ORAL
  Filled 2022-10-12 (×8): qty 1

## 2022-10-12 MED ORDER — SUCCINYLCHOLINE CHLORIDE 200 MG/10ML IV SOSY
PREFILLED_SYRINGE | INTRAVENOUS | Status: AC
  Filled 2022-10-12: qty 30

## 2022-10-12 MED ORDER — THROMBIN (RECOMBINANT) 5000 UNITS EX SOLR
CUTANEOUS | Status: DC | PRN
  Administered 2022-10-12: 5000 [IU] via TOPICAL

## 2022-10-12 MED ORDER — MENTHOL 3 MG MT LOZG: 1.0000 | LOZENGE | OROMUCOSAL | Status: DC | PRN

## 2022-10-12 MED ORDER — IPRATROPIUM-ALBUTEROL 0.5-2.5 (3) MG/3ML IN SOLN
RESPIRATORY_TRACT | Status: AC
  Filled 2022-10-12: qty 3

## 2022-10-12 MED ORDER — CEFAZOLIN SODIUM-DEXTROSE 2-3 GM-%(50ML) IV SOLR
INTRAVENOUS | Status: DC | PRN
  Administered 2022-10-12: 2 g via INTRAVENOUS

## 2022-10-12 MED ORDER — OXYCODONE HCL 5 MG PO TABS
10.0000 mg | ORAL_TABLET | ORAL | Status: DC | PRN
  Administered 2022-10-13 – 2022-10-19 (×23): 10 mg via ORAL
  Filled 2022-10-12 (×23): qty 2

## 2022-10-12 MED ORDER — VASOPRESSIN 20 UNIT/ML IV SOLN
INTRAVENOUS | Status: DC | PRN
  Administered 2022-10-12 (×2): 1 [IU] via INTRAVENOUS

## 2022-10-12 MED ORDER — ONDANSETRON HCL 4 MG/2ML IJ SOLN
INTRAMUSCULAR | Status: AC
  Filled 2022-10-12: qty 20

## 2022-10-12 MED ORDER — PANTOPRAZOLE SODIUM 40 MG PO TBEC
40.0000 mg | DELAYED_RELEASE_TABLET | Freq: Every day | ORAL | Status: DC
  Administered 2022-10-13 – 2022-10-19 (×7): 40 mg via ORAL
  Filled 2022-10-12 (×8): qty 1

## 2022-10-12 MED ORDER — EPHEDRINE 5 MG/ML INJ
INTRAVENOUS | Status: AC
  Filled 2022-10-12: qty 5

## 2022-10-12 MED ORDER — FENTANYL CITRATE (PF) 100 MCG/2ML IJ SOLN
INTRAMUSCULAR | Status: AC
  Filled 2022-10-12: qty 2

## 2022-10-12 MED ORDER — SODIUM CHLORIDE 0.9% FLUSH
3.0000 mL | INTRAVENOUS | Status: DC | PRN
  Administered 2022-10-15: 3 mL via INTRAVENOUS

## 2022-10-12 MED ORDER — PROPOFOL 10 MG/ML IV BOLUS
INTRAVENOUS | Status: DC | PRN
  Administered 2022-10-12: 120 mg via INTRAVENOUS

## 2022-10-12 MED ORDER — ROCURONIUM BROMIDE 10 MG/ML (PF) SYRINGE
PREFILLED_SYRINGE | INTRAVENOUS | Status: AC
  Filled 2022-10-12: qty 10

## 2022-10-12 MED ORDER — SODIUM CHLORIDE 0.9% FLUSH
3.0000 mL | Freq: Two times a day (BID) | INTRAVENOUS | Status: DC
  Administered 2022-10-13 – 2022-10-19 (×11): 3 mL via INTRAVENOUS

## 2022-10-12 MED ORDER — PHENYLEPHRINE HCL-NACL 20-0.9 MG/250ML-% IV SOLN
INTRAVENOUS | Status: AC
  Filled 2022-10-12: qty 250

## 2022-10-12 MED ORDER — IRBESARTAN 150 MG PO TABS
75.0000 mg | ORAL_TABLET | Freq: Every day | ORAL | Status: DC
  Administered 2022-10-13 – 2022-10-19 (×5): 75 mg via ORAL
  Filled 2022-10-12 (×6): qty 1

## 2022-10-12 MED ORDER — TRAZODONE HCL 50 MG PO TABS
50.0000 mg | ORAL_TABLET | Freq: Every day | ORAL | Status: DC
  Administered 2022-10-13 – 2022-10-18 (×6): 50 mg via ORAL
  Filled 2022-10-12 (×6): qty 1

## 2022-10-12 MED ORDER — IPRATROPIUM-ALBUTEROL 0.5-2.5 (3) MG/3ML IN SOLN
3.0000 mL | Freq: Once | RESPIRATORY_TRACT | Status: AC
  Administered 2022-10-12: 3 mL via RESPIRATORY_TRACT

## 2022-10-12 MED ORDER — DEXAMETHASONE SODIUM PHOSPHATE 10 MG/ML IJ SOLN
INTRAMUSCULAR | Status: DC | PRN
  Administered 2022-10-12: 5 mg via INTRAVENOUS

## 2022-10-12 MED ORDER — FENTANYL CITRATE PF 50 MCG/ML IJ SOSY: 25.0000 ug | PREFILLED_SYRINGE | INTRAMUSCULAR | Status: DC | PRN

## 2022-10-12 MED ORDER — AMLODIPINE BESYLATE 5 MG PO TABS
5.0000 mg | ORAL_TABLET | Freq: Every day | ORAL | Status: DC
  Administered 2022-10-13 – 2022-10-15 (×3): 5 mg via ORAL
  Filled 2022-10-12 (×4): qty 1

## 2022-10-12 MED ORDER — 0.9 % SODIUM CHLORIDE (POUR BTL) OPTIME
TOPICAL | Status: DC | PRN
  Administered 2022-10-12: 500 mL

## 2022-10-12 MED ORDER — POTASSIUM CHLORIDE IN NACL 20-0.9 MEQ/L-% IV SOLN
INTRAVENOUS | Status: DC
  Filled 2022-10-12 (×2): qty 1000

## 2022-10-12 MED ORDER — SPIRONOLACTONE 25 MG PO TABS
25.0000 mg | ORAL_TABLET | Freq: Every day | ORAL | Status: DC
  Administered 2022-10-13 – 2022-10-19 (×7): 25 mg via ORAL
  Filled 2022-10-12 (×9): qty 1

## 2022-10-12 MED ORDER — MIDAZOLAM HCL 2 MG/2ML IJ SOLN
INTRAMUSCULAR | Status: DC | PRN
  Administered 2022-10-12: 2 mg via INTRAVENOUS

## 2022-10-12 MED ORDER — METHYLPREDNISOLONE ACETATE 40 MG/ML IJ SUSP
INTRAMUSCULAR | Status: AC
  Filled 2022-10-12: qty 1

## 2022-10-12 MED ORDER — LIDOCAINE HCL (PF) 2 % IJ SOLN
INTRAMUSCULAR | Status: AC
  Filled 2022-10-12: qty 20

## 2022-10-12 SURGICAL SUPPLY — 44 items
ADH SKN CLS APL DERMABOND .7 (GAUZE/BANDAGES/DRESSINGS) ×1
AGENT HMST KT MTR STRL THRMB (HEMOSTASIS) ×1
BASIN KIT SINGLE STR (MISCELLANEOUS) ×1 IMPLANT
BUR NEURO DRILL SOFT 3.0X3.8M (BURR) ×1 IMPLANT
CNTNR URN SCR LID CUP LEK RST (MISCELLANEOUS) ×1 IMPLANT
CONT SPEC 4OZ STRL OR WHT (MISCELLANEOUS) ×1
DERMABOND ADVANCED .7 DNX12 (GAUZE/BANDAGES/DRESSINGS) ×1 IMPLANT
DRAPE C ARM PK CFD 31 SPINE (DRAPES) ×1 IMPLANT
DRAPE LAPAROTOMY 100X77 ABD (DRAPES) ×1 IMPLANT
DRAPE MICROSCOPE SPINE 48X150 (DRAPES) IMPLANT
DRSG OPSITE POSTOP 4X6 (GAUZE/BANDAGES/DRESSINGS) IMPLANT
DRSG TEGADERM 4X4.75 (GAUZE/BANDAGES/DRESSINGS) IMPLANT
ELECT EZSTD 165MM 6.5IN (MISCELLANEOUS) ×1
ELECT REM PT RETURN 9FT ADLT (ELECTROSURGICAL) ×1
ELECTRODE EZSTD 165MM 6.5IN (MISCELLANEOUS) ×1 IMPLANT
ELECTRODE REM PT RTRN 9FT ADLT (ELECTROSURGICAL) ×1 IMPLANT
GAUZE SPONGE 2X2 8PLY STRL LF (GAUZE/BANDAGES/DRESSINGS) IMPLANT
GLOVE BIOGEL PI IND STRL 6.5 (GLOVE) ×1 IMPLANT
GLOVE SURG SYN 6.5 ES PF (GLOVE) ×1 IMPLANT
GLOVE SURG SYN 6.5 PF PI (GLOVE) ×1 IMPLANT
GLOVE SURG SYN 8.5 E (GLOVE) ×3 IMPLANT
GLOVE SURG SYN 8.5 PF PI (GLOVE) ×3 IMPLANT
GOWN SRG LRG LVL 4 IMPRV REINF (GOWNS) ×1 IMPLANT
GOWN SRG XL LVL 3 NONREINFORCE (GOWNS) ×1 IMPLANT
GOWN STRL NON-REIN TWL XL LVL3 (GOWNS) ×1
GOWN STRL REIN LRG LVL4 (GOWNS) ×1
HEMOVAC 400CC 10FR (MISCELLANEOUS) IMPLANT
KIT SPINAL PRONEVIEW (KITS) ×1 IMPLANT
MANIFOLD NEPTUNE II (INSTRUMENTS) ×1 IMPLANT
MARKER SKIN DUAL TIP RULER LAB (MISCELLANEOUS) ×1 IMPLANT
NDL SAFETY ECLIP 18X1.5 (MISCELLANEOUS) ×1 IMPLANT
NS IRRIG 1000ML POUR BTL (IV SOLUTION) ×1 IMPLANT
PACK LAMINECTOMY ARMC (PACKS) ×1 IMPLANT
SURGIFLO W/THROMBIN 8M KIT (HEMOSTASIS) ×1 IMPLANT
SUT DVC VLOC 3-0 CL 6 P-12 (SUTURE) ×1 IMPLANT
SUT ETHILON 3-0 FS-10 30 BLK (SUTURE) ×2
SUT VIC AB 0 CT1 27 (SUTURE) ×2
SUT VIC AB 0 CT1 27XCR 8 STRN (SUTURE) ×1 IMPLANT
SUT VIC AB 2-0 CT1 18 (SUTURE) ×1 IMPLANT
SUTURE EHLN 3-0 FS-10 30 BLK (SUTURE) IMPLANT
SYR 30ML LL (SYRINGE) ×2 IMPLANT
SYR 3ML LL SCALE MARK (SYRINGE) ×1 IMPLANT
TRAP FLUID SMOKE EVACUATOR (MISCELLANEOUS) ×1 IMPLANT
WATER STERILE IRR 1000ML POUR (IV SOLUTION) ×2 IMPLANT

## 2022-10-12 NOTE — ED Triage Notes (Addendum)
Pt BIB [facility] from home. Pt had a non-infectious cyst removed from her spine on Monday.  Yesterday pt started experiencing urinary incontinence and numbness to bilateral legs. Per pt she did have to stay overnight after the procedure due to a CSF leak. Pt with hx of previous CVA with redicual R side deficit.   EMS Vitals  B/P 126/74 SpO2 98% on RA  Hr 70

## 2022-10-12 NOTE — ED Provider Notes (Signed)
[facility] Provider Note    Event Date/Time   First MD Initiated Contact with Patient 10/12/22 1245     (approximate)  History   Chief Complaint: Post-op Problem  HPI  Susan [name] is a 62 y.o. female with a past medical history of anemia, anxiety, bipolar, gastric reflux, hypertension, hyperlipidemia, sciatica, CVA with right-sided deficits presents to the emergency department for increased right leg weakness numbness and urinary incontinence.  According to the patient she recently had a cyst removed from her lumbar spine this past Monday by Dr. Marcell Barlow of neurosurgery.  Patient states the surgery was complicated by a CSF leak requiring her to stay Monday night was discharged yesterday.  She states since going home yesterday she has had increased weakness and numbness of the right leg more than baseline to the point where she is having trouble ambulating today.  She is also noted that her bladder feels very full and when she urinates she is not able to fully urinate and at times has had a small amount of incontinence.  Denies any bowel incontinence.  Physical Exam   Triage Vital Signs: ED Triage Vitals  Enc Vitals Group     BP 10/12/22 1207 (!) 99/58     Pulse Rate 10/12/22 1207 64     Resp 10/12/22 1207 20     Temp 10/12/22 1207 (!) 97.4 F (36.3 C)     Temp Source 10/12/22 1207 Oral     SpO2 10/12/22 1207 94 %     Weight 10/12/22 1211 211 lb (95.7 kg)     Height 10/12/22 1211 5\' 2"  (1.575 m)     Head Circumference --      Peak Flow --      Pain Score 10/12/22 1210 7     Pain Loc --      Pain Edu? --      Excl. in GC? --     Most recent vital signs: Vitals:   10/12/22 1230 10/12/22 1300  BP: (!) 116/59 122/80  Pulse: 66 63  Resp: 17 20  Temp:    SpO2: 92% 100%    General: Awake, no distress.  CV:  Good peripheral perfusion.  Regular rate and rhythm  Resp:  Normal effort.  Equal breath sounds bilaterally.  Abd:  No distention.   Soft, nontender.  No rebound or guarding. Other:  Patient has 4+ out of 5 strength in left lower extremity, 3/5 strength in right lower extremity.  States subjective decrease sensation throughout the right lower extremity more so in the distal right lower extremity than proximal.   ED Results / Procedures / Treatments   EKG  EKG viewed and interpreted by myself shows what appears to be a sinus rhythm at 70 bpm with a narrow QRS, normal axis, normal intervals with nonspecific ST changes.  RADIOLOGY  MRI pending   MEDICATIONS ORDERED IN ED: Medications - No data to display   IMPRESSION / MDM / ASSESSMENT AND PLAN / ED COURSE  I reviewed the triage vital signs and the nursing notes.  Patient's presentation is most consistent with acute presentation with potential threat to life or bodily function.  Patient presents emergency department for worsening weakness/numbness of the right leg with concern for incontinence at times and difficulty urinating.  Patient had a recent cyst removed from her lumbar spine.  Symptoms are concerning for possible cauda equina possibly due to epidural hematoma less likely abscess given surgery was 2 days ago,  however given the patient's symptoms we will obtain an MRI of the lumbar spine to further evaluate.  Once this has been completed we will discuss with neurosurgery for further recommendations.  Lab work has resulted showing a reassuring CBC with just slight leukocytosis 11,800, overall reassuring chemistry.  Patient's urinalysis shows no significant finding.  Patient however on bladder scan had approximately 650 cc of urine unable to urinate.  We placed a Foley catheter to drain.  MRI is pending.  Patient care signed out to oncoming provider.  Anticipate likely MRI and disposition based on MRI results.  FINAL CLINICAL IMPRESSION(S) / ED DIAGNOSES   Weakness Urinary retention  Note:  This document was prepared using Dragon voice recognition software and  may include unintentional dictation errors.   Minna Antis, MD 10/12/22 [phone]

## 2022-10-12 NOTE — ED Provider Notes (Signed)
MRI concerning for fluid collection compressing the thecal sac. Discussed with Dr. Katrinka Blazing with neurosurgery. Patient will be taken to the OR.   Phineas Semen, MD 10/12/22 Windell Moment

## 2022-10-12 NOTE — Telephone Encounter (Signed)
FYI-   She is s/p right  L4-5 laminoforaminotomy on 10/10/22. Her intraoperative course was complicated by a CSF leak which was repaired intraoperatively.   Spoke with patient and her brother.   Still with numbness in right leg. Having trouble lifting the leg when she walks. Minimal numbness in left leg.   2 episodes of urinary incontinence- no urge to go and then was wet. Then she feels urge to go and cannot urinate. No bowel issues.   She has almost fallen multiple times and family is concerned as they cannot pick her up if she falls. Brother is concerned that she is unsafe at home.   Recommend she go to ED for further evaluation. They will call EMS to transfer.   Dr. Katrinka Blazing is on call today. Will give him heads up as well.

## 2022-10-12 NOTE — Plan of Care (Incomplete)
Patient A&Ox4, from home, independent in room  

## 2022-10-12 NOTE — Op Note (Signed)
Indications: Ms. Karne Tlatelpa is suffering fromcauda equina syndrome.  She had surgery on June 3rd and presented with progressive loss of RLE function and difficulty urinating.  This is an unplanned return to OR.  Findings: epidural hematoma  Preoperative Diagnosis: Cauda Equina Syndrome, Epidural fluid collection Postoperative Diagnosis: same   EBL: 25 ml IVF: see AR ml Drains: 1 placed Disposition: Extubated and Stable to PACU Complications: none  Preoperative Note:  Risks of surgery discussed include: infection, bleeding, stroke, coma, death, paralysis, CSF leak, nerve/spinal cord injury, numbness, tingling, weakness, complex regional pain syndrome, recurrent stenosis and/or disc herniation, vascular injury, development of instability, neck/back pain, need for further surgery, persistent symptoms, development of deformity, and the risks of anesthesia. The patient understood these risks and agreed to proceed.  Operative Note:   1. L3-5 lumbar decompression with evacuation of epidural hematoma  The patient was then brought from the preoperative center with intravenous access established.  The patient underwent general anesthesia and endotracheal tube intubation, and was then rotated on the [place] rail top where all pressure points were appropriately padded.  The skin was then thoroughly cleansed.  Perioperative antibiotic prophylaxis was administered.  Sterile prep and drapes were then applied and a timeout was then observed.    The prior incision was identified.  The stitches were removed.  The incision was then opened.  The fascial sutures were removed.  The dilators were advanced to the L4-5 level and confirmed using fluoroscopy.  The right side of the L4 lamina was fully exposed.  The tube was moved up superiorly and then the laminotomy defect was extended superiorly to the L3-4 disc space such that right hemilaminectomy was performed from the L3/4 disc space extending to the L4/5  disc space.  The tube was then secured to the L3-4 level.  The ligamentum flavum was resected and an epidural hematoma was encountered.  This was evacuated.  An area of epidural bleeding was noted and was coagulated.  There was no obvious spinal fluid leakage noted.  The area was copiously irrigated.  Hemostasis was achieved.  A drain was placed.  I then closed in layers using 0 and 2-0 Vicryl and 3-0 nylon on the skin.   Sneha Willig K. Myer Haff MD

## 2022-10-12 NOTE — Consult Note (Signed)
Neurosurgery-New Consultation Evaluation 10/12/2022 Susan [name] [id number]  Identifying Statement: Susan [name] is a 62 y.o. female from [name] [id number] with a past medical history of anemia, anxiety, bipolar disorder, GERD, hypertension, hyperlipidemia, CVA  Physician Requesting Consultation: No ref. provider found  History of Present Illness: Susan [name] is a 62 year old female presenting to the ER status post right L4-5 laminoforaminotomy for synovial cyst resection on 10/10/2022.  Her intraoperative course was complicated by a CSF leak which was repaired intraoperatively.  She was admitted overnight for pain control and monitoring.  She did well with resolution of her right leg pain however she had persistent right leg numbness.  Yesterday she endorsed increased numbness in her right leg with radiating pain into her left buttocks and new weakness in her right foot.  She has had trouble getting up stairs which was not the case immediately after surgery.  She has also had some urinary incontinence.  She denies any fevers, chills, drainage from her incision, or positional headaches. She states that she last ate last night and has not had anything to eat today.  Past Medical History:  Past Medical History:  Diagnosis Date   ADD (attention deficit disorder)    Alcohol abuse    Anemia    Anxiety    Aortic atherosclerosis (HCC) 02/20/2018   Chest CT Sept 2019   Arthritis    rheumatoid arthritis   Asthma    Bipolar disorder (HCC)    Centrilobular emphysema (HCC)    Cocaine use disorder, moderate, in sustained remission (HCC)    Constipation    Degenerative disc disease at L5-S1 level 09/28/2016   See ortho note May 2018   Depression    bipolar, hx of suicide attempt   Diabetes mellitus, type 2 (HCC)    Diverticulitis 2013   DOE (dyspnea on exertion)    Drug use    Family history of adverse reaction to anesthesia    mom-delayed emergence   GERD (gastroesophageal  reflux disease)    H/O suicide attempt    slit wrists   Hepatitis C 06/26/2014   treated with Harvoni   Hip pain    History of echocardiogram    a. 04/2019 Echo: EF >65%, nl RV fxn.   History of MRSA infection 2013   HLD (hyperlipidemia)    Hypertension    a. 06/2021 Renal Duplex: ? bilat RAS; b. 06/2021 CTA Abd: No signif RAS.   Hyponatremia    Hypothyroidism    Incisional hernia 11/08/2012   Knee pain    Left carotid bruit    Morbid obesity (HCC)    Multinodular thyroid    OSA (obstructive sleep apnea)    OSA on CPAP    Osteoporosis    Palpitations    Post-traumatic osteoarthritis of right knee 09/24/2015   Recurrent ventral hernia 11/08/2012   Status post total right knee replacement using cement 05/10/2016   Stroke (HCC) 07/05/2022   right arm,torso and right leg numbness   Tobacco use disorder    Vitamin B12 deficiency    Vitamin D deficiency disease     Social History: Social History   Socioeconomic History   Marital status: Widowed    Spouse name: Not on file   Number of children: 3   Years of education: Not on file   Highest education level: Some college, no degree  Occupational History   Occupation: disabled  Tobacco Use   Smoking status: Every Day    Packs/day: 0.50  Years: 41.00    Additional pack years: 0.00    Total pack years: 20.50    Types: Cigarettes   Smokeless tobacco: Never  Vaping Use   Vaping Use: Never used  Substance and Sexual Activity   Alcohol use: No    Alcohol/week: 0.0 standard drinks of 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   Drug use: No    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   Sexual activity: Not Currently  Other Topics Concern   Not on file  Social History Narrative   Not on file   Social Determinants of Health   Financial Resource Strain: Low Risk  (06/02/2021)   Overall Financial Resource Strain (CARDIA)    Difficulty of Paying Living Expenses: Not hard at all  Food Insecurity: No Food  Insecurity (07/05/2022)   Hunger Vital Sign    Worried About Running Out of Food in the Last Year: Never true    Ran Out of Food in the Last Year: Never true  Transportation Needs: No Transportation Needs (07/05/2022)   PRAPARE - Administrator, Civil Service (Medical): No    Lack of Transportation (Non-Medical): No  Physical Activity: Sufficiently Active (06/02/2021)   Exercise Vital Sign    Days of Exercise per Week: 5 days    Minutes of Exercise per Session: 30 min  Stress: No Stress Concern Present (06/02/2021)   [employer] of Occupational Health - Occupational Stress Questionnaire    Feeling of Stress : Not at all  Social Connections: Moderately Isolated (06/02/2021)   Social Connection and Isolation Panel [NHANES]    Frequency of Communication with Friends and Family: More than three times a week    Frequency of Social Gatherings with Friends and Family: Three times a week    Attends Religious Services: Never    Active Member of Clubs or Organizations: Yes    Attends Banker Meetings: More than 4 times per year    Marital Status: Widowed  Intimate Partner Violence: Not At Risk (07/05/2022)   Humiliation, Afraid, Rape, and Kick questionnaire    Fear of Current or Ex-Partner: No    Emotionally Abused: No    Physically Abused: No    Sexually Abused: No   Living arrangements (living alone, with partner): Husband   Family History: Family History  Problem Relation Age of Onset   Arthritis Mother    Asthma Mother    Mental illness Mother    Thyroid disease Mother    COPD Mother    Heart disease Mother    Congestive Heart Failure Mother    Alcohol abuse Mother    Eating disorder Mother    Bipolar disorder Mother    Alcohol abuse Father    Heart attack Father    Alcohol abuse Sister    Drug abuse Sister    Mental illness Sister    Mental illness Sister    Fibromyalgia Sister    Obesity Sister    Pneumonia Sister    Depression Sister     Mental illness Sister    Alcohol abuse Sister    Drug abuse Sister    Arthritis Brother    Mental illness Brother    Cancer Brother        non-hodkins lymphoma   Drug abuse Daughter    Drug abuse Son    Alcohol abuse Son    Drug abuse Son    Alcohol abuse Son    Diabetes  Neg Hx    Stroke Neg Hx    Breast cancer Neg Hx    Thyroid cancer Neg Hx     Review of Systems:  Review of Systems - General ROS: Negative Psychological ROS: Negative Ophthalmic ROS: Negative ENT ROS: Negative Hematological and Lymphatic ROS: Negative  Endocrine ROS: Negative Respiratory ROS: Negative Cardiovascular ROS: Negative Gastrointestinal ROS: Negative Genito-Urinary ROS: Negative Musculoskeletal ROS: Negative Neurological ROS: Negative Dermatological ROS: Negative  Physical Exam: BP 122/80 (BP Location: Right Arm)   Pulse 63   Temp (!) 97.4 F (36.3 C) (Oral)   Resp 20   Ht 5\' 2"  (1.575 m)   Wt 95.7 kg   SpO2 100%   BMI 38.59 kg/m  Body mass index is 38.59 kg/m. Body surface area is 2.05 meters squared. General appearance: Alert, cooperative, in no acute distress Head: Normocephalic, atraumatic Eyes: Normal, EOM intact Oropharynx: Moist without lesions Neck: Supple, no tenderness Heart: Normal, regular rate and rhythm, without murmur Lungs: Clear to auscultation, good air exchange Abdomen: Soft, nondistended Ext: No edema in LE bilaterally, good distal pulses  Neurologic exam:  Mental status: alertness: alert, orientation: person, place, time, affect: normal Speech: fluent and clear Cranial nerves:  CN II-II grossly intact Motor:LLE- 4+/5 HF 4- KE 5/5 distally. RLE- 4+/5 HF, KE. 3/5 DF 2/5 EHL and PF Sensory: intact to light touch in all extremities Reflexes: 2+ and symmetric bilaterally for arms and legs Coordination: intact finger to nose Gait: normal   Laboratory: Results for orders placed or performed during the hospital encounter of 10/12/22  CBC  Result Value Ref  Range   WBC 11.8 (H) 4.0 - 10.5 K/uL   RBC 3.31 (L) 3.87 - 5.11 MIL/uL   Hemoglobin 10.7 (L) 12.0 - 15.0 g/dL   HCT 40.9 (L) 81.1 - 91.4 %   MCV 97.0 80.0 - 100.0 fL   MCH 32.3 26.0 - 34.0 pg   MCHC 33.3 30.0 - 36.0 g/dL   RDW 78.2 95.6 - 21.3 %   Platelets 163 150 - 400 K/uL   nRBC 0.0 0.0 - 0.2 %  Comprehensive metabolic panel  Result Value Ref Range   Sodium 131 (L) 135 - 145 mmol/L   Potassium 4.2 3.5 - 5.1 mmol/L   Chloride 102 98 - 111 mmol/L   CO2 21 (L) 22 - 32 mmol/L   Glucose, Bld 93 70 - 99 mg/dL   BUN 17 8 - 23 mg/dL   Creatinine, Ser 0.86 (H) 0.44 - 1.00 mg/dL   Calcium 8.6 (L) 8.9 - 10.3 mg/dL   Total Protein 6.8 6.5 - 8.1 g/dL   Albumin 3.7 3.5 - 5.0 g/dL   AST 22 15 - 41 U/L   ALT 10 0 - 44 U/L   Alkaline Phosphatase 68 38 - 126 U/L   Total Bilirubin 0.8 0.3 - 1.2 mg/dL   GFR, Estimated 54 (L) >60 mL/min   Anion gap 8 5 - 15  Urinalysis, Complete w Microscopic -Urine, Clean Catch  Result Value Ref Range   Color, Urine STRAW (A) YELLOW   APPearance CLEAR (A) CLEAR   Specific Gravity, Urine 1.004 (L) 1.005 - 1.030   pH 6.0 5.0 - 8.0   Glucose, UA NEGATIVE NEGATIVE mg/dL   Hgb urine dipstick NEGATIVE NEGATIVE   Bilirubin Urine NEGATIVE NEGATIVE   Ketones, ur NEGATIVE NEGATIVE mg/dL   Protein, ur NEGATIVE NEGATIVE mg/dL   Nitrite NEGATIVE NEGATIVE   Leukocytes,Ua NEGATIVE NEGATIVE   RBC / HPF 0-5 0 -  5 RBC/hpf   WBC, UA 0-5 0 - 5 WBC/hpf   Bacteria, UA RARE (A) NONE SEEN   Squamous Epithelial / HPF 0-5 0 - 5 /HPF   Imaging:  I personally reviewed radiology studies to include: MRI L spine   FINDINGS: Segmentation: 5 lumbar type vertebral bodies as numbered previously.   Alignment: Scoliotic curvature convex to the right as seen previously. No significant listhesis.   Vertebrae: Edematous change of the endplates at T10, Z61 and T12, likely to relate to regional pain. This is probably degenerative. Lesser vertebral endplate edema noted at L3-4 and  L4-5.   Conus medullaris and cauda equina: Conus extends to the L1 level. Conus and cauda equina appear normal.   Paraspinal and other soft tissues: Posterior surgical approach at the L4-5 level from the right-side has an expected appearance.   Disc levels:   At the surgical level and extending within the spinal canal from the upper L3 level to the L 4 5 disc level, there is a fluid collection which causes marked compression of the thecal sac. This is most severe at the level of the L3-4 disc where the fluid collection within the canal measures 1.8 x 1.5 cm in diameter and markedly flattens the thecal sac anteriorly.   Other than the surgical decompression of the synovial cyst on the right and the postoperative intraspinal fluid collection, there is no change since the preoperative exam, as one might expect.   IMPRESSION: 1. At the surgical level and extending from the upper L3 level to the L4-5 disc level, there is a postoperative fluid collection within the spinal canal which causes marked compression of the thecal sac anteriorly. This is most severe at the level of the L3-4 disc where the fluid collection within the canal measures 1.8 x 1.5 cm in diameter and markedly flattens the thecal sac anteriorly.     Electronically Signed   By: Paulina Fusi M.D.   On: 10/12/2022 16:20   Impression/Plan:     1.  Diagnosis: Cauda equina syndrome  2.  Plan - patient's degree of epidural fluid collection and associated weakness is concerning for cauda equina. -Plans to take patient back to the operating room tonight for evaluation of fluid collection.  Manning Charity PA-C Neurosurgery

## 2022-10-12 NOTE — Transfer of Care (Signed)
Immediate Anesthesia Transfer of Care Note  Patient: Susan [name]  Procedure(s) Performed: LUMBAR LAMINECTOMY/ DECOMPRESSION WITH MET-RX  Patient Location: PACU  Anesthesia Type:General  Level of Consciousness: awake, alert , and oriented  Airway & Oxygen Therapy: Patient Spontanous Breathing  Post-op Assessment: Report given to RN and Post -op Vital signs reviewed and stable  Post vital signs: Reviewed and stable  Last Vitals:  Vitals Value Taken Time  BP 104/56 10/12/22 2110  Temp    Pulse 65 10/12/22 2110  Resp 15 10/12/22 2110  SpO2 96 % 10/12/22 2110  Vitals shown include unvalidated device data.  Last Pain:  Vitals:   10/12/22 1725  TempSrc: Oral  PainSc: 4          Complications: No notable events documented.

## 2022-10-12 NOTE — Anesthesia Procedure Notes (Signed)
Procedure Name: Intubation Date/Time: 10/12/2022 7:13 PM  Performed by: Karoline Caldwell, CRNAPre-anesthesia Checklist: Patient identified, Patient being monitored, Timeout performed, Emergency Drugs available and Suction available Patient Re-evaluated:Patient Re-evaluated prior to induction Oxygen Delivery Method: Circle system utilized Preoxygenation: Pre-oxygenation with 100% oxygen Induction Type: IV induction Ventilation: Mask ventilation without difficulty Laryngoscope Size: 3 and McGraph Grade View: Grade I Tube type: Oral Tube size: 7.0 mm Number of attempts: 1 Airway Equipment and Method: Stylet Placement Confirmation: ETT inserted through vocal cords under direct vision, positive ETCO2 and breath sounds checked- equal and bilateral Secured at: 21 cm Tube secured with: Tape Dental Injury: Teeth and Oropharynx as per pre-operative assessment

## 2022-10-12 NOTE — ED Notes (Signed)
Pt to OR at this time.

## 2022-10-12 NOTE — Anesthesia Preprocedure Evaluation (Addendum)
Anesthesia Evaluation  Patient identified by MRN, date of birth, ID band Patient awake    Reviewed: Allergy & Precautions, NPO status , Patient's Chart, lab work & pertinent test results  History of Anesthesia Complications Negative for: history of anesthetic complications  Airway Mallampati: II  TM Distance: <3 FB Neck ROM: Full    Dental  (+) Poor Dentition, Missing, Dental Advidsory Given   Pulmonary neg shortness of breath, asthma , sleep apnea and Continuous Positive Airway Pressure Ventilation , COPD,  COPD inhaler, neg recent URI, Current SmokerPatient did not abstain from smoking. Rare inhaler use, never hospitalized for copd   Pulmonary exam normal breath sounds clear to auscultation       Cardiovascular Exercise Tolerance: Poor METShypertension, Pt. on medications (-) angina + DOE  (-) CAD and (-) Past MI (-) dysrhythmias  Rhythm:Regular Rate:Normal - Systolic murmurs TTE 2024: 1. Left ventricular ejection fraction, by estimation, is 60 to 65%. The  left ventricle has normal function. The left ventricle has no regional  wall motion abnormalities. Left ventricular diastolic parameters are  consistent with Grade I diastolic  dysfunction (impaired relaxation).   2. Right ventricular systolic function is normal. The right ventricular  size is normal.   3. The mitral valve is grossly normal. No evidence of mitral valve  regurgitation.   4. The aortic valve was not well visualized. Aortic valve regurgitation  is not visualized.     Neuro/Psych neg Seizures PSYCHIATRIC DISORDERS Anxiety Depression Bipolar Disorder   Patient denied hx of stroke, but brother at bedside reminded her of CVA at end of March. It has been about 2.5 months since then. Followed up with neurology one month after the stroke, who did not make any further recommendations for optimization other than continuing what she is doing (work on smoking cessation,  diet).  Family practitioner deemed her optimized from an overall perspective.  Neuromuscular disease CVA, No Residual Symptoms    GI/Hepatic ,GERD  Medicated and Controlled,,(+)     (-) substance abuse  , Hepatitis -, C  Endo/Other  diabetes  On GLP1, held for over a week. Denies GI symptoms today  Renal/GU negative Renal ROS     Musculoskeletal  (+) Arthritis ,    Abdominal  (+) + obese  Peds  Hematology   Anesthesia Other Findings Patient presenting with cauda equina symptoms and with a known fluid collection seen on MRI.  Plan to emergently bring her to the OR for decompression.  Last had nutter butters at 10:30am, so we are now NPO for >8 hours.  Past Medical History: No date: ADD (attention deficit disorder) No date: Alcohol abuse No date: Anemia No date: Anxiety 02/20/2018: Aortic atherosclerosis (HCC)     Comment:  Chest CT Sept 2019 No date: Arthritis     Comment:  rheumatoid arthritis No date: Asthma No date: Bipolar disorder (HCC) No date: Centrilobular emphysema (HCC) No date: Cocaine use disorder, moderate, in sustained remission (HCC) No date: Constipation 09/28/2016: Degenerative disc disease at L5-S1 level     Comment:  See ortho note May 2018 No date: Depression     Comment:  bipolar, hx of suicide attempt No date: Diabetes mellitus, type 2 (HCC) 2013: Diverticulitis No date: DOE (dyspnea on exertion) No date: Drug use No date: Family history of adverse reaction to anesthesia     Comment:  mom-delayed emergence No date: GERD (gastroesophageal reflux disease) No date: H/O suicide attempt     Comment:  slit wrists 06/26/2014: Hepatitis  C     Comment:  treated with Harvoni No date: Hip pain No date: History of echocardiogram     Comment:  a. 04/2019 Echo: EF >65%, nl RV fxn. 2013: History of MRSA infection No date: HLD (hyperlipidemia) No date: Hypertension     Comment:  a. 06/2021 Renal Duplex: ? bilat RAS; b. 06/2021 CTA Abd:               No  signif RAS. No date: Hyponatremia No date: Hypothyroidism 11/08/2012: Incisional hernia No date: Knee pain No date: Left carotid bruit No date: Morbid obesity (HCC) No date: Multinodular thyroid No date: OSA (obstructive sleep apnea) No date: OSA on CPAP No date: Osteoporosis No date: Palpitations 09/24/2015: Post-traumatic osteoarthritis of right knee 11/08/2012: Recurrent ventral hernia 05/10/2016: Status post total right knee replacement using cement 07/05/2022: Stroke (HCC)     Comment:  right arm,torso and right leg numbness No date: Tobacco use disorder No date: Vitamin B12 deficiency No date: Vitamin D deficiency disease  Reproductive/Obstetrics                             Anesthesia Physical Anesthesia Plan  ASA: 3 and emergent  Anesthesia Plan: General   Post-op Pain Management: Ofirmev IV (intra-op)*   Induction: Intravenous  PONV Risk Score and Plan: 3 and Ondansetron, Dexamethasone and Midazolam  Airway Management Planned: Oral ETT and Video Laryngoscope Planned  Additional Equipment: None  Intra-op Plan:   Post-operative Plan: Extubation in OR  Informed Consent: I have reviewed the patients History and Physical, chart, labs and discussed the procedure including the risks, benefits and alternatives for the proposed anesthesia with the patient or authorized representative who has indicated his/her understanding and acceptance.     Dental advisory given  Plan Discussed with: CRNA and Surgeon  Anesthesia Plan Comments: (Discussed risks of anesthesia with patient, including PONV, sore throat, lip/dental/eye damage. Rare risks discussed as well, such as cardiorespiratory and neurological sequelae, and allergic reactions. I did specifically discuss neurological risk given her borderline time out from her last stroke. I told her normally we wait around 3 months before doing elective procedures after a stroke, however given the neurology  and family medicine notes not believing her to be able to be optimized any further, I advised it was not unreasonable to proceed, however she would still be at increased neurological risk. She understands. Discussed the role of CRNA in patient's perioperative care. Patient understands.)       Anesthesia Quick Evaluation

## 2022-10-12 NOTE — Consult Note (Signed)
Initial Consultation Note   Patient: Susan [name]:[id number] DOB: 1961/05/08 PCP: Glori Luis, MD DOA: 10/12/2022 DOS: the patient was seen and examined on 10/13/2022 Primary service: Venetia Night, MD  Referring physician: Dr.Yarborough . Reason for consult: Hypoxia.   Assessment/Plan: >>Acute respiratory failure with hypoxia: Vitals:   10/12/22 2115 10/12/22 2130 10/12/22 2145 10/12/22 2252  BP: (!) 101/57 110/77 (!) 105/46 122/68  Pulse: 61 69 (!) 59 63  Temp:    (!) 97.4 F (36.3 C)  Resp: 16 17 14 18   Height:      Weight:      SpO2: 92% 93% 92% 96%  TempSrc:      BMI (Calculated):      SpO2: 96 % O2 Flow Rate (L/min): 3 L/min D/D include PNA / COPD/ CHF/ post op atelectasis. Dimer / Chest xray pending. / Procalcitonin / BNP.  Incentive spirometer.  Will follow and additional orders based on results.   EKG shows Junctional but ? MAT.  We will repeat EKG. Follow st depression in lat leads. Also in leads I/II/III.  >>Emphysema/ tobacco abuse: Venous blood gas. Supplemental oxygen.  Albuterol PRN. Nicotine patch.  >>Anemia: 2/2 to operative blood loss. We will follow.    >>AKI: Lab Results  Component Value Date   CREATININE 1.15 (H) 10/12/2022   CREATININE 1.09 09/14/2022   CREATININE 0.87 08/02/2022  2/2 to low blood pressure , We will cont with IVF. Strict I/O.   TRH will continue to follow the patient.  HPI: Susan [name] is a 62 y.o. female with past medical history of anemia, anxiety, bipolar, hypertension, hepatitis C, OSA, stroke, tobacco abuse and CVA with right-sided baseline deficits comes back to [name] today with increased right leg weakness worsening.  Patient had lumbar spine surgery yesterday and is having trouble with ambulation today has incomplete emptying of the bladder with frequency.  Consult requested as patient was found to be hypoxic. Pt is post op Cauda Equina Syndrome, Epidural fluid collection. Patient requiring 3 L  nasal cannula with O2 sats in the high 90s. Past Medical History:  Diagnosis Date   ADD (attention deficit disorder)    Alcohol abuse    Anemia    Anxiety    Aortic atherosclerosis (HCC) 02/20/2018   Chest CT Sept 2019   Arthritis    rheumatoid arthritis   Asthma    Bipolar disorder (HCC)    Centrilobular emphysema (HCC)    Cocaine use disorder, moderate, in sustained remission (HCC)    Constipation    Degenerative disc disease at L5-S1 level 09/28/2016   See ortho note May 2018   Depression    bipolar, hx of suicide attempt   Diabetes mellitus, type 2 (HCC)    Diverticulitis 2013   DOE (dyspnea on exertion)    Drug use    Family history of adverse reaction to anesthesia    mom-delayed emergence   GERD (gastroesophageal reflux disease)    H/O suicide attempt    slit wrists   Hepatitis C 06/26/2014   treated with Harvoni   Hip pain    History of echocardiogram    a. 04/2019 Echo: EF >65%, nl RV fxn.   History of MRSA infection 2013   HLD (hyperlipidemia)    Hypertension    a. 06/2021 Renal Duplex: ? bilat RAS; b. 06/2021 CTA Abd: No signif RAS.   Hyponatremia    Hypothyroidism    Incisional hernia 11/08/2012   Knee pain    Left  carotid bruit    Morbid obesity (HCC)    Multinodular thyroid    OSA (obstructive sleep apnea)    OSA on CPAP    Osteoporosis    Palpitations    Post-traumatic osteoarthritis of right knee 09/24/2015   Recurrent ventral hernia 11/08/2012   Status post total right knee replacement using cement 05/10/2016   Stroke (HCC) 07/05/2022   right arm,torso and right leg numbness   Tobacco use disorder    Vitamin B12 deficiency    Vitamin D deficiency disease    Past Surgical History:  Procedure Laterality Date   BILATERAL SALPINGOOPHORECTOMY     due to abnormal mass   BREAST BIOPSY Left    neg   BREAST SURGERY Left 20 yrs ago   CESAREAN SECTION     COLONOSCOPY     COLONOSCOPY WITH PROPOFOL N/A 10/16/2017   Procedure: COLONOSCOPY WITH  PROPOFOL;  Surgeon: Wyline Mood, MD;  Location: [facility] ENDOSCOPY;  Service: Gastroenterology;  Laterality: N/A;   COLONOSCOPY WITH PROPOFOL N/A 02/16/2021   Procedure: COLONOSCOPY WITH PROPOFOL;  Surgeon: Wyline Mood, MD;  Location: [facility] ENDOSCOPY;  Service: Gastroenterology;  Laterality: N/A;   FORAMINOTOMY 1 LEVEL Right 10/10/2022   Procedure: RIGHT L4-5 LAMINOFORAMINOTOMY;  Surgeon: Venetia Night, MD;  Location: [facility] ORS;  Service: Neurosurgery;  Laterality: Right;   HERNIA REPAIR  06/2011, July 2014   Ventral wall repair with Physiomesh   HERNIA REPAIR     2nd.vental wall repair   JOINT REPLACEMENT Right    knee   TONSILLECTOMY     TOTAL HIP ARTHROPLASTY Left 07/23/2019   Procedure: TOTAL HIP ARTHROPLASTY;  Surgeon: Christena Flake, MD;  Location: [facility] ORS;  Service: Orthopedics;  Laterality: Left;   TOTAL KNEE ARTHROPLASTY Right 05/10/2016   Procedure: TOTAL KNEE ARTHROPLASTY;  Surgeon: Christena Flake, MD;  Location: [facility] ORS;  Service: Orthopedics;  Laterality: Right;   TUBAL LIGATION     Social History:   reports that she has been smoking cigarettes. She has a 20.50 pack-year smoking history. She has never used smokeless tobacco. She reports that she does not drink alcohol and does not use drugs.  Allergies  Allergen Reactions   Wellbutrin [Bupropion]     Patient reports made " deathly sick " years ago at appointment 03/18/20.   Lasix [Furosemide] Other (See Comments)    Electrolyte imbalance    Family History  Problem Relation Age of Onset   Arthritis Mother    Asthma Mother    Mental illness Mother    Thyroid disease Mother    COPD Mother    Heart disease Mother    Congestive Heart Failure Mother    Alcohol abuse Mother    Eating disorder Mother    Bipolar disorder Mother    Alcohol abuse Father    Heart attack Father    Alcohol abuse Sister    Drug abuse Sister    Mental illness Sister    Mental illness Sister    Fibromyalgia Sister    Obesity Sister    Pneumonia  Sister    Depression Sister    Mental illness Sister    Alcohol abuse Sister    Drug abuse Sister    Arthritis Brother    Mental illness Brother    Cancer Brother        non-hodkins lymphoma   Drug abuse Daughter    Drug abuse Son    Alcohol abuse Son    Drug abuse Son  Alcohol abuse Son    Diabetes Neg Hx    Stroke Neg Hx    Breast cancer Neg Hx    Thyroid cancer Neg Hx     Prior to Admission medications   Medication Sig Start Date End Date Taking? Authorizing Provider  acetaminophen (TYLENOL) 500 MG tablet Take 1,000 mg by mouth every morning.    [provider]  albuterol (VENTOLIN HFA) 108 (90 Base) MCG/ACT inhaler Inhale 2 puffs into the lungs every 6 (six) hours as needed for wheezing or shortness of breath. 06/02/22   Coralyn Helling, MD  amLODipine (NORVASC) 5 MG tablet Take 1 tablet (5 mg total) by mouth daily. 09/28/22   Glori Luis, MD  aspirin EC 81 MG tablet Take 1 tablet (81 mg total) by mouth daily. Swallow whole. 07/08/22   Lanae Boast, MD  carvedilol (COREG) 25 MG tablet TAKE ONE TABLET BY MOUTH TWICE DAILY WITH A MEAL 10/11/22   Glori Luis, MD  celecoxib (CELEBREX) 100 MG capsule Take 1 capsule (100 mg total) by mouth 2 (two) times daily. 10/11/22   [name] Borders, PA  clotrimazole (LOTRIMIN) 1 % cream Apply 1 application topically 2 (two) times daily as needed.    [provider]  cyclobenzaprine (FLEXERIL) 10 MG tablet Take 1 tablet (10 mg total) by mouth 3 (three) times daily as needed for muscle spasms. 10/11/22   [name] Borders, PA  DULoxetine (CYMBALTA) 20 MG capsule TAKE 1 CAPSULE BY MOUTH EVERY DAY WITH 30 MG CAPSULE 08/12/22   Jomarie Longs, MD  DULoxetine (CYMBALTA) 30 MG capsule Take 1 capsule (30 mg total) by mouth daily. Take daily with 20 mg cap. 08/12/22   Jomarie Longs, MD  gabapentin (NEURONTIN) 300 MG capsule Take 600 mg by mouth 2 (two) times daily.     [provider]  naloxone [facility]) nasal spray 4 mg/0.1  mL Place 0.4 mg into the nose once. 12/27/21   [provider]  oxyCODONE (OXY IR/ROXICODONE) 5 MG immediate release tablet Take 1 tablet (5 mg total) by mouth every 3 (three) hours as needed for moderate pain ((score 4 to 6)). 10/11/22   [name] Borders, PA  OZEMPIC, 1 MG/DOSE, 4 MG/3ML SOPN INJECT 1MG  UNDER THE SKIN (SUBCUTANEOUSLY) ONCE WEEKLY ON SUNDAY Patient taking differently: Inject 1 mg as directed once a week. Tuesdays 09/02/22   Glori Luis, MD  pantoprazole (PROTONIX) 40 MG tablet TAKE 1 TABLET(40 MG) BY MOUTH DAILY 08/18/22   Glori Luis, MD  rosuvastatin (CRESTOR) 20 MG tablet Take 1 tablet (20 mg total) by mouth daily. 07/04/22   Eulis Foster, FNP  senna (SENOKOT) 8.6 MG TABS tablet Take 1 tablet (8.6 mg total) by mouth 2 (two) times daily. 10/11/22   [name] Borders, PA  spironolactone (ALDACTONE) 25 MG tablet Take 1 tablet (25 mg total) by mouth daily. 09/07/22   Glori Luis, MD  sulfamethoxazole-trimethoprim (BACTRIM DS) 800-160 MG tablet Take 1 tablet by mouth 2 (two) times daily for 5 days. Increase water intake while taking this medication. 10/07/22 10/12/22  Verlee Monte, NP  telmisartan (MICARDIS) 80 MG tablet TAKE 1 TABLET(80 MG) BY MOUTH DAILY 05/05/22   Glori Luis, MD  traZODone (DESYREL) 50 MG tablet Take 1 tablet (50 mg total) by mouth at bedtime. 08/31/22   Jomarie Longs, MD  Vitamin D, Ergocalciferol, (DRISDOL) 1.25 MG (50000 UNIT) CAPS capsule NEW PRESCRIPTION REQUEST: TAKE ONE CAPSULE BY MOUTH EVERY WEEK Patient taking  differently: 50,000 Units every 7 (seven) days. Sundays 07/25/22   Glori Luis, MD    Physical Exam: Vitals:   10/12/22 2115 10/12/22 2130 10/12/22 2145 10/12/22 2252  BP: (!) 101/57 110/77 (!) 105/46 122/68  Pulse: 61 69 (!) 59 63  Resp: 16 17 14 18   Temp:    (!) 97.4 F (36.3 C)  TempSrc:      SpO2: 92% 93% 92% 96%  Weight:      Height:       Physical Exam Vitals reviewed.  Constitutional:       General: She is not in acute distress.    Appearance: She is obese.  HENT:     Head: Normocephalic and atraumatic.     Right Ear: External ear normal.     Left Ear: External ear normal.     Nose: Nose normal.     Mouth/Throat:     Mouth: Mucous membranes are dry.  Eyes:     Extraocular Movements: Extraocular movements intact.     Pupils: Pupils are equal, round, and reactive to light.  Neck:     Vascular: No carotid bruit.  Cardiovascular:     Rate and Rhythm: Normal rate and regular rhythm.  Neurological:     Mental Status: She is alert.     Data Reviewed:  Results for orders placed or performed during the hospital encounter of 10/12/22 (from the past 24 hour(s))  CBC     Status: Abnormal   Collection Time: 10/12/22 12:16 PM  Result Value Ref Range   WBC 11.8 (H) 4.0 - 10.5 K/uL   RBC 3.31 (L) 3.87 - 5.11 MIL/uL   Hemoglobin 10.7 (L) 12.0 - 15.0 g/dL   HCT 16.1 (L) 09.6 - 04.5 %   MCV 97.0 80.0 - 100.0 fL   MCH 32.3 26.0 - 34.0 pg   MCHC 33.3 30.0 - 36.0 g/dL   RDW 40.9 81.1 - 91.4 %   Platelets 163 150 - 400 K/uL   nRBC 0.0 0.0 - 0.2 %  Comprehensive metabolic panel     Status: Abnormal   Collection Time: 10/12/22 12:16 PM  Result Value Ref Range   Sodium 131 (L) 135 - 145 mmol/L   Potassium 4.2 3.5 - 5.1 mmol/L   Chloride 102 98 - 111 mmol/L   CO2 21 (L) 22 - 32 mmol/L   Glucose, Bld 93 70 - 99 mg/dL   BUN 17 8 - 23 mg/dL   Creatinine, Ser 7.82 (H) 0.44 - 1.00 mg/dL   Calcium 8.6 (L) 8.9 - 10.3 mg/dL   Total Protein 6.8 6.5 - 8.1 g/dL   Albumin 3.7 3.5 - 5.0 g/dL   AST 22 15 - 41 U/L   ALT 10 0 - 44 U/L   Alkaline Phosphatase 68 38 - 126 U/L   Total Bilirubin 0.8 0.3 - 1.2 mg/dL   GFR, Estimated 54 (L) >60 mL/min   Anion gap 8 5 - 15  Urinalysis, Complete w Microscopic -Urine, Clean Catch     Status: Abnormal   Collection Time: 10/12/22  2:34 PM  Result Value Ref Range   Color, Urine STRAW (A) YELLOW   APPearance CLEAR (A) CLEAR   Specific Gravity, Urine  1.004 (L) 1.005 - 1.030   pH 6.0 5.0 - 8.0   Glucose, UA NEGATIVE NEGATIVE mg/dL   Hgb urine dipstick NEGATIVE NEGATIVE   Bilirubin Urine NEGATIVE NEGATIVE   Ketones, ur NEGATIVE NEGATIVE mg/dL   Protein, ur NEGATIVE NEGATIVE  mg/dL   Nitrite NEGATIVE NEGATIVE   Leukocytes,Ua NEGATIVE NEGATIVE   RBC / HPF 0-5 0 - 5 RBC/hpf   WBC, UA 0-5 0 - 5 WBC/hpf   Bacteria, UA RARE (A) NONE SEEN   Squamous Epithelial / HPF 0-5 0 - 5 /HPF  CBG monitoring, ED     Status: Abnormal   Collection Time: 10/12/22  9:24 PM  Result Value Ref Range   Glucose-Capillary 107 (H) 70 - 99 mg/dL     Family Communication: none.  Primary team communication: none.  Thank you very much for involving [name] in the care of your patient.  Author: Gertha Calkin, MD 10/13/2022 1:37 AM  For on call review [email or web address].

## 2022-10-13 ENCOUNTER — Inpatient Hospital Stay: Payer: 59

## 2022-10-13 ENCOUNTER — Encounter: Payer: Self-pay | Admitting: Neurosurgery

## 2022-10-13 DIAGNOSIS — D649 Anemia, unspecified: Secondary | ICD-10-CM | POA: Diagnosis present

## 2022-10-13 DIAGNOSIS — J9601 Acute respiratory failure with hypoxia: Secondary | ICD-10-CM

## 2022-10-13 DIAGNOSIS — N179 Acute kidney failure, unspecified: Secondary | ICD-10-CM | POA: Insufficient documentation

## 2022-10-13 LAB — BASIC METABOLIC PANEL
Anion gap: 8 (ref 5–15)
BUN: 15 mg/dL (ref 8–23)
CO2: 20 mmol/L — ABNORMAL LOW (ref 22–32)
Calcium: 8.6 mg/dL — ABNORMAL LOW (ref 8.9–10.3)
Chloride: 109 mmol/L (ref 98–111)
Creatinine, Ser: 0.93 mg/dL (ref 0.44–1.00)
GFR, Estimated: >60 mL/min (ref >60)
Glucose, Bld: 142 mg/dL — ABNORMAL HIGH (ref 70–99)
Potassium: 4.6 mmol/L (ref 3.5–5.1)
Sodium: 137 mmol/L (ref 135–145)

## 2022-10-13 LAB — BLOOD GAS, VENOUS
Acid-base deficit: 4.1 mmol/L — ABNORMAL HIGH (ref 0.0–2.0)
Bicarbonate: 22.2 mmol/L (ref 20.0–28.0)
O2 Saturation: 76.8 %
Patient temperature: 37
pCO2, Ven: 44 mmHg (ref 44–60)
pH, Ven: 7.31 (ref 7.25–7.43)
pO2, Ven: 46 mmHg — ABNORMAL HIGH (ref 32–45)

## 2022-10-13 LAB — GLUCOSE, CAPILLARY
Glucose-Capillary: 117 mg/dL — ABNORMAL HIGH (ref 70–99)
Glucose-Capillary: 184 mg/dL — ABNORMAL HIGH (ref 70–99)
Glucose-Capillary: 129 mg/dL — ABNORMAL HIGH (ref 70–99)
Glucose-Capillary: 132 mg/dL — ABNORMAL HIGH (ref 70–99)

## 2022-10-13 LAB — PROCALCITONIN: Procalcitonin: <0.1 ng/mL

## 2022-10-13 LAB — BRAIN NATRIURETIC PEPTIDE: B Natriuretic Peptide: 95.9 pg/mL (ref 0.0–100.0)

## 2022-10-13 LAB — D-DIMER, QUANTITATIVE: D-Dimer, Quant: 0.5 ug/mL-FEU (ref 0.00–0.50)

## 2022-10-13 MED ORDER — THIAMINE HCL 100 MG/ML IJ SOLN
100.0000 mg | Freq: Once | INTRAMUSCULAR | Status: AC
  Administered 2022-10-13: 100 mg via INTRAVENOUS
  Filled 2022-10-13: qty 2

## 2022-10-13 MED ORDER — CHLORHEXIDINE GLUCONATE CLOTH 2 % EX PADS
6.0000 | MEDICATED_PAD | Freq: Every day | CUTANEOUS | Status: DC
  Administered 2022-10-13 – 2022-10-19 (×7): 6 via TOPICAL

## 2022-10-13 MED ORDER — NICOTINE 14 MG/24HR TD PT24
14.0000 mg | MEDICATED_PATCH | Freq: Every day | TRANSDERMAL | Status: DC
  Filled 2022-10-13 (×4): qty 1

## 2022-10-13 MED ORDER — INSULIN ASPART 100 UNIT/ML IJ SOLN
0.0000 [IU] | Freq: Three times a day (TID) | INTRAMUSCULAR | Status: DC
  Administered 2022-10-16: 3 [IU] via SUBCUTANEOUS
  Filled 2022-10-13 (×3): qty 1

## 2022-10-13 NOTE — Progress Notes (Addendum)
   Neurosurgery Progress Note  History: Susan [name] is s/p L3-5 decompression with epidural hematoma evacuation for cauda equina.   POD1: Patient reports improvement of her leg pain as well as some improvement in her lower extremity weakness.  Her immediate postop recovery was complicated by acute respiratory failure with hypoxia.  Internal medicine was consulted for further evaluation and treatment  Physical Exam: Vitals:   10/13/22 0445 10/13/22 0726  BP: 120/68 (!) 151/73  Pulse: 72 85  Resp: 20 14  Temp: 98.6 F (37 C) 98 F (36.7 C)  SpO2: 96% 98%    AA Ox3 CNI  Strength:LLE- 5/5 throughout. RLE- 4+/5 HF, KE. 3/5 DF 2/5 EHL and PF  HV output 20 since surgery  Data:  Other tests/results: Not applicable to her recent surgery.  Please see hospitalist consult note for further details  Assessment/Plan:  Susan [name] is a 62 year old female who underwent a right L4-5 laminoforaminotomy for synovial cyst resection on 10/10/2022.  This was complicated by CSF leak which was repaired intraoperatively.  Patient represented to the ER on postop day 2 with symptoms concerning for cauda equina.  She underwent a L3-5 decompression and evacuation of epidural hematoma on 10/12/2022  - mobilize - pain control - DVT prophylaxis - Will keep HV drain for now - PTOT -We appreciate internal medicine's assistance with additional medical concerns  Manning Charity PA-C Department of Neurosurgery

## 2022-10-13 NOTE — NC FL2 (Signed)
[place] MEDICAID FL2 LEVEL OF CARE FORM     IDENTIFICATION  Patient Name: Susan [name] Birthdate: 1960-07-17 Sex: female Admission Date (Current Location): 10/12/2022  [place] and [place] Number:  Chiropodist and Address:  [facility], [address] [place], [place] [address]      Provider Number: [id number]  Attending Physician Name and Address:  Venetia Night, MD  Relative Name and Phone Number:       Current Level of Care: Hospital Recommended Level of Care: Skilled Nursing Facility Prior Approval Number:    Date Approved/Denied:   PASRR Number: Manual review  Discharge Plan: SNF    Current Diagnoses: Patient Active Problem List   Diagnosis Date Noted   Acute respiratory failure with hypoxia (HCC) 10/13/2022   AKI (acute kidney injury) (HCC) 10/13/2022   Anemia 10/13/2022   Cauda equina syndrome (HCC) 10/12/2022   Epidural hematoma (HCC) 10/12/2022   Mass in epidural space 10/12/2022   Synovial cyst of lumbar facet joint 10/10/2022   Lumbar radiculopathy 10/10/2022   S/P laminectomy 10/10/2022   Preop examination 09/28/2022   Epigastric pain 07/22/2022   Enlarged thyroid 07/22/2022   Diarrhea 07/22/2022   CVA (cerebrovascular accident) (HCC) 07/05/2022   Skin lesion 06/28/2022   BMI 38.0-38.9,adult 07/14/2021   Type 2 diabetes mellitus without complication, without long-term current use of insulin (HCC) 06/16/2021   H/O thyroid nodule 05/31/2021   History of hepatitis C- treated 2014- 2015 with [facility] gastroenterology and followed.  05/19/2021   Left carotid bruit 04/28/2021   Anxiety 05/28/2020   Bipolar affective disorder in remission (HCC) 05/28/2020   Bipolar disorder, in full remission, most recent episode mixed (HCC) 03/06/2020   Need for immunization against influenza 01/27/2020   Routine physical examination 01/27/2020   Morbid obesity (HCC) 01/27/2020   Osteoporosis 11/06/2019   Bipolar 1 disorder,  mixed, mild (HCC) 10/29/2019   PTSD (post-traumatic stress disorder) 09/27/2019   Bereavement 09/27/2019   Cocaine use disorder, moderate, in sustained remission (HCC) 09/27/2019   Status post total hip replacement, left 07/23/2019   Primary osteoarthritis of left hip 06/14/2019   Depression 10/23/2018   Allergic rhinitis 08/23/2018   Aortic atherosclerosis (HCC) 02/20/2018   Centrilobular emphysema (HCC) 02/20/2018   Hyperlipidemia 03/28/2017   Degenerative disc disease at L5-S1 level 09/28/2016   Tobacco use disorder 04/21/2016   GERD (gastroesophageal reflux disease) 12/17/2015   Chronic back pain 10/09/2015   Post-traumatic osteoarthritis of right knee 09/24/2015   Alcohol use disorder, severe, in sustained remission (HCC) 04/13/2015   Vitamin D deficiency 04/09/2015   Vitamin B12 deficiency 04/09/2015   OSA on CPAP 04/09/2015   Subclinical hyperthyroidism 06/26/2014   Goiter colloid, toxic, nodular 06/26/2014   Hypertension 06/26/2014   Tobacco use disorder, continuous 06/26/2014   Bipolar I disorder (HCC) 06/26/2014   Lumbar radiculitis 10/03/2013   Diverticulosis 08/24/2013    Orientation RESPIRATION BLADDER Height & Weight     Self, Time, Place, Situation  Normal, Other (Comment) (Has home CPAP) Indwelling catheter Weight: 211 lb (95.7 kg) Height:  5\' 2"  (157.5 cm)  BEHAVIORAL SYMPTOMS/MOOD NEUROLOGICAL BOWEL NUTRITION STATUS   (None)  (None) Continent Diet (Heart healthy)  AMBULATORY STATUS COMMUNICATION OF NEEDS Skin   Limited Assist Verbally Surgical wounds (Incision on right back: Postop dressing.)                       Personal Care Assistance Level of Assistance  Bathing, Feeding, Dressing Bathing  Assistance: Limited assistance Feeding assistance: Limited assistance Dressing Assistance: Limited assistance     Functional Limitations Info  Sight, Hearing, Speech Sight Info: Adequate Hearing Info: Adequate Speech Info: Adequate    SPECIAL CARE  FACTORS FREQUENCY  PT (By licensed PT), OT (By licensed OT)     PT Frequency: 5 x week OT Frequency: 5 x week            Contractures Contractures Info: Not present    Additional Factors Info  Code Status, Allergies Code Status Info: Full code Allergies Info: Wellbutrin (Bupropion), Lasix (Furosemide)           Current Medications (10/13/2022):  This is the current hospital active medication list Current Facility-Administered Medications  Medication Dose Route Frequency Provider Last Rate Last Admin   0.9 %  sodium chloride infusion  250 mL Intravenous Continuous Venetia Night, MD       acetaminophen (TYLENOL) tablet 650 mg  650 mg Oral Q4H PRN Venetia Night, MD   650 mg at 10/13/22 1229   Or   acetaminophen (TYLENOL) suppository 650 mg  650 mg Rectal Q4H PRN Venetia Night, MD       albuterol (PROVENTIL) (2.5 MG/3ML) 0.083% nebulizer solution 2.5 mg  2.5 mg Inhalation Q6H PRN Venetia Night, MD       amLODipine (NORVASC) tablet 5 mg  5 mg Oral Daily Venetia Night, MD   5 mg at 10/13/22 1013   bisacodyl (DULCOLAX) EC tablet 5 mg  5 mg Oral Daily PRN Venetia Night, MD       carvedilol (COREG) tablet 25 mg  25 mg Oral BID WC Venetia Night, MD   25 mg at 10/13/22 1010   celecoxib (CELEBREX) capsule 100 mg  100 mg Oral BID Venetia Night, MD   100 mg at 10/13/22 1014   Chlorhexidine Gluconate Cloth 2 % PADS 6 each  6 each Topical Daily Venetia Night, MD   6 each at 10/13/22 1520   cyclobenzaprine (FLEXERIL) tablet 10 mg  10 mg Oral TID PRN Venetia Night, MD       DULoxetine (CYMBALTA) DR capsule 20 mg  20 mg Oral Daily Venetia Night, MD   20 mg at 10/13/22 1028   DULoxetine (CYMBALTA) DR capsule 30 mg  30 mg Oral Daily Venetia Night, MD   30 mg at 10/13/22 1028   gabapentin (NEURONTIN) capsule 600 mg  600 mg Oral BID Venetia Night, MD   600 mg at 10/13/22 1010   HYDROmorphone (DILAUDID) injection 0.5 mg  0.5 mg Intravenous  Q2H PRN Venetia Night, MD       irbesartan (AVAPRO) tablet 75 mg  75 mg Oral Daily Venetia Night, MD   75 mg at 10/13/22 1029   menthol-cetylpyridinium (CEPACOL) lozenge 3 mg  1 lozenge Oral PRN Venetia Night, MD       Or   phenol (CHLORASEPTIC) mouth spray 1 spray  1 spray Mouth/Throat PRN Venetia Night, MD       nicotine (NICODERM CQ - dosed in mg/24 hours) patch 14 mg  14 mg Transdermal Daily Gertha Calkin, MD       ondansetron [facility]) tablet 4 mg  4 mg Oral Q6H PRN Venetia Night, MD       Or   ondansetron [facility]) injection 4 mg  4 mg Intravenous Q6H PRN Venetia Night, MD       oxyCODONE (Oxy IR/ROXICODONE) immediate release tablet 10 mg  10 mg Oral Q3H PRN Venetia Night, MD  10 mg at 10/13/22 1353   pantoprazole (PROTONIX) EC tablet 40 mg  40 mg Oral Daily Venetia Night, MD   40 mg at 10/13/22 1013   rosuvastatin (CRESTOR) tablet 20 mg  20 mg Oral Daily Venetia Night, MD   20 mg at 10/13/22 1013   senna (SENOKOT) tablet 8.6 mg  1 tablet Oral BID Venetia Night, MD   8.6 mg at 10/13/22 1010   sodium chloride flush (NS) 0.9 % injection 3 mL  3 mL Intravenous Q12H Venetia Night, MD   3 mL at 10/13/22 1147   sodium chloride flush (NS) 0.9 % injection 3 mL  3 mL Intravenous PRN Venetia Night, MD       sodium phosphate (FLEET) 7-19 GM/118ML enema 1 enema  1 enema Rectal Once PRN Venetia Night, MD       spironolactone (ALDACTONE) tablet 25 mg  25 mg Oral Daily Venetia Night, MD   25 mg at 10/13/22 1029   traZODone (DESYREL) tablet 50 mg  50 mg Oral QHS Venetia Night, MD         Discharge Medications: Please see discharge summary for a list of discharge medications.  Relevant Imaging Results:  Relevant Lab Results:   Additional Information SS#: [id number]  Margarito Liner, LCSW

## 2022-10-13 NOTE — Discharge Instructions (Signed)
Your surgeon has performed an operation on your lumbar spine (low back) to relieve pressure on one or more nerves. Many times, patients feel better immediately after surgery and can "overdo it." Even if you feel well, it is important that you follow these activity guidelines. If you do not let your back heal properly from the surgery, you can increase the chance of a disc herniation and/or return of your symptoms. The following are instructions to help in your recovery once you have been discharged from the hospital.  * It is ok to take NSAIDs after surgery.  Activity    No bending, lifting, or twisting ("BLT"). Avoid lifting objects heavier than 10 pounds (gallon milk jug).  Where possible, avoid household activities that involve lifting, bending, pushing, or pulling such as laundry, vacuuming, grocery shopping, and childcare. Try to arrange for help from friends and family for these activities while your back heals.  Increase physical activity slowly as tolerated.  Taking short walks is encouraged, but avoid strenuous exercise. Do not jog, run, bicycle, lift weights, or participate in any other exercises unless specifically allowed by your doctor. Avoid prolonged sitting, including car rides.  Talk to your doctor before resuming sexual activity.  You should not drive until cleared by your doctor.  Until released by your doctor, you should not return to work or school.  You should rest at home and let your body heal.   You may shower three days after your surgery.  After showering, lightly dab your incision dry. Do not take a tub bath or go swimming for 3 weeks, or until approved by your doctor at your follow-up appointment.  If you smoke, we strongly recommend that you quit.  Smoking has been proven to interfere with normal healing in your back and will dramatically reduce the success rate of your surgery. Please contact [facility] (800-QUIT-NOW) and use the resources at www.[facility].com for  assistance in stopping smoking.  Surgical Incision   If you have a dressing on your incision, you may remove it three days after your surgery. Keep your incision area clean and dry.  If you have staples or stitches on your incision, you should have a follow up scheduled for removal. If you do not have staples or stitches, you will have steri-strips (small pieces of surgical tape) or Dermabond glue. The steri-strips/glue should begin to peel away within about a week (it is fine if the steri-strips fall off before then). If the strips are still in place one week after your surgery, you may gently remove them.  Diet            You may return to your usual diet. Be sure to stay hydrated.  When to Contact Us  Although your surgery and recovery will likely be uneventful, you may have some residual numbness, aches, and pains in your back and/or legs. This is normal and should improve in the next few weeks.  However, should you experience any of the following, contact us immediately: New numbness or weakness Pain that is progressively getting worse, and is not relieved by your pain medications or rest Bleeding, redness, swelling, pain, or drainage from surgical incision Chills or flu-like symptoms Fever greater than 101.0 F (38.3 C) Problems with bowel or bladder functions Difficulty breathing or shortness of breath Warmth, tenderness, or swelling in your calf  Contact Information During office hours (Monday-Friday 9 am to 5 pm), please call your physician at [phone] and ask for Kendelyn Jean After hours and   weekends, please call [phone] and speak with the neurosurgeon on call For a life-threatening emergency, call 911  

## 2022-10-13 NOTE — TOC CM/SW Note (Signed)
RE: Susan [name] Date of Birth: [birth year] Date: 10/13/2022   To Whom It May Concern:  Please be advised that the above-named patient will require a short-term nursing home stay - anticipated 30 days or less for rehabilitation and strengthening.  The plan is for return home.

## 2022-10-13 NOTE — Progress Notes (Signed)
Secure chat with Dr Myer Haff and Dr Allena Katz re new IV due to "beeping"/ alarming.  Dr Myer Haff states ok to saline lock PIV.  No need for new PIV at this time.  No other IV meds ordered.  Zoia RN also aware.

## 2022-10-13 NOTE — Hospital Course (Signed)
Consult request for hypoxia. Pt is post op spinal surgery.

## 2022-10-13 NOTE — Evaluation (Signed)
Physical Therapy Evaluation Patient Details Name: Susan [name] MRN: [id number] DOB: 12-Nov-1960 Today's Date: 10/13/2022  History of Present Illness  Susan [name] is a 62 y/o F with PMH: HTN, DM2, HLD, ETOH use, THA, PTSD, cocaine, bipolar, and L thalamic infarct (06/2022), s/p lumbar laminoforaminotomy (10/10/22). Presenting post L3-5 decompression with evacuation of epidural hematoma on 10/12/22 for Cauda Equina Syndrome.    Clinical Impression  Pt sitting EOB with OT upon arrival to the room.  Pt assisted with getting lines/leads in more manageable space for ambulation attempt.  Pt performs well with transfer, however has some unsteadiness noted when in standing.  Pt very shaky and self-reports it due to being fearful of falling.  Pt attempted ambulation a few steps forward and backwards, however elected to not continue with any further ambulation at this time.  Pt would benefit from increased therapy sessions in order to improve overall functional mobility for safe discharge.  Pt left in bed with all needs met and call bell within reach.         Recommendations for follow up therapy are one component of a multi-disciplinary discharge planning process, led by the attending physician.  Recommendations may be updated based on patient status, additional functional criteria and insurance authorization.  Follow Up Recommendations Can patient physically be transported by private vehicle: Yes     Assistance Recommended at Discharge Set up Supervision/Assistance  Patient can return home with the following  A little help with walking and/or transfers;A little help with bathing/dressing/bathroom;Assistance with cooking/housework;Assist for transportation;Help with stairs or ramp for entrance    Equipment Recommendations None recommended by PT  Recommendations for Other Services       Functional Status Assessment Patient has had a recent decline in their functional status and demonstrates the  ability to make significant improvements in function in a reasonable and predictable amount of time.     Precautions / Restrictions Precautions Precautions: Fall;Back Precaution Comments: reviewed BLTs Required Braces or Orthoses: Other Brace (No brace required) Restrictions Weight Bearing Restrictions: No      Mobility  Bed Mobility Overal bed mobility: Modified Independent                  Transfers Overall transfer level: Needs assistance Equipment used: Rolling walker (2 wheels) Transfers: Sit to/from Stand Sit to Stand: Min guard           General transfer comment: Pt used RW, expressed fear of falling and shakiness. Vitals maintained adequence on room air. \    Ambulation/Gait Ambulation/Gait assistance: Min guard Gait Distance (Feet): 3 Feet Assistive device: Rolling walker (2 wheels) Gait Pattern/deviations: Wide base of support Gait velocity: decreased     General Gait Details: no LOB, however pt is experiencing some shakiness due to fear of falling.  Stairs            Wheelchair Mobility    Modified Rankin (Stroke Patients Only)       Balance Overall balance assessment: Needs assistance Sitting-balance support: Feet supported Sitting balance-Leahy Scale: Fair Sitting balance - Comments: Limited trunk mobility secondary to lower back pain   Standing balance support: Bilateral upper extremity supported Standing balance-Leahy Scale: Fair                               Pertinent Vitals/Pain Pain Assessment Pain Assessment: 0-10 Pain Score: 7  Pain Location: low back Pain Descriptors / Indicators: Aching, Numbness (numbness noted  in the LE's and feet.) Pain Intervention(s): Limited activity within patient's tolerance    Home Living Family/patient expects to be discharged to:: Private residence Living Arrangements: Children Available Help at Discharge: Family;Friend(s);Available PRN/intermittently;Other (Comment)  (Brother and son are available to assist upon return home) Type of Home: House Home Access: Stairs to enter Entrance Stairs-Rails: Doctor, general practice of Steps: 5   Home Layout: Laundry or work area in basement;One level Home Equipment: Agricultural consultant (2 wheels);Cane - single point;Toilet riser;Grab bars - toilet;Grab bars - tub/shower;Shower seat;Hand held Financial trader (4 wheels) Additional Comments: Pt states she does not use AD or shower seat at baseline, but has them. Bathroom is equipped with many grab bars 2/2 caring for her mother a while ago.    Prior Function Prior Level of Function : Independent/Modified Independent             Mobility Comments: Indep with ADLs, household and community mobilization without assist device ADLs Comments: (I) ADLs. Enjoys working in [employer] (states she is retired; sounds as though she volunteers), cutting hair, baking. Drives. Has small dog she cares for. Denies falls. Pt is looking forward to an [place] cruise in August 2024 with a friend.     Hand Dominance   Dominant Hand: Right    Extremity/Trunk Assessment   Upper Extremity Assessment Upper Extremity Assessment: Overall WFL for tasks assessed    Lower Extremity Assessment Lower Extremity Assessment: Generalized weakness RLE Deficits / Details: Pt endorsing numbness/decreased sensation to RLE, worse than normal (had CVA with residual numbness in Feb '24) strength WFLs except decresaed R foot DF, 4/5    Cervical / Trunk Assessment Cervical / Trunk Assessment: Back Surgery  Communication   Communication: No difficulties  Cognition                                                General Comments      Exercises Total Joint Exercises Ankle Circles/Pumps: AROM, Strengthening, Both, 20 reps, Supine   Assessment/Plan    PT Assessment Patient needs continued PT services  PT Problem List Decreased strength;Decreased  mobility;Decreased range of motion;Decreased activity tolerance;Decreased balance;Decreased knowledge of use of DME;Pain       PT Treatment Interventions DME instruction;Therapeutic activities;Gait training;Therapeutic exercise;Patient/family education;Stair training;Balance training;Functional mobility training;Neuromuscular re-education    PT Goals (Current goals can be found in the Care Plan section)  Acute Rehab PT Goals Patient Stated Goal: to go home PT Goal Formulation: With patient Time For Goal Achievement: 10/27/22 Potential to Achieve Goals: Good    Frequency 7X/week     Co-evaluation PT/OT/SLP Co-Evaluation/Treatment: Yes Reason for Co-Treatment: For patient/therapist safety PT goals addressed during session: Mobility/safety with mobility;Balance;Proper use of DME OT goals addressed during session: ADL's and self-care;Proper use of Adaptive equipment and DME       AM-PAC PT "6 Clicks" Mobility  Outcome Measure Help needed turning from your back to your side while in a flat bed without using bedrails?: None Help needed moving from lying on your back to sitting on the side of a flat bed without using bedrails?: None Help needed moving to and from a bed to a chair (including a wheelchair)?: None Help needed standing up from a chair using your arms (e.g., wheelchair or bedside chair)?: A Little Help needed to walk in hospital room?: A Lot Help needed climbing 3-5  steps with a railing? : A Lot 6 Click Score: 19    End of Session Equipment Utilized During Treatment: Gait belt Activity Tolerance: Patient tolerated treatment well Patient left: in bed;with call bell/phone within reach;with bed alarm set Nurse Communication: Mobility status PT Visit Diagnosis: Other abnormalities of gait and mobility (R26.89);Muscle weakness (generalized) (M62.81);Pain Pain - Right/Left:  (midline LBP) Pain - part of body:  (Back)    Time: 1116-1140 PT Time Calculation (min) (ACUTE  ONLY): 24 min   Charges:   PT Evaluation $PT Eval Low Complexity: 1 Low          Nolon Bussing, PT, DPT Physical Therapist - [place]  [facility]  10/13/22, 1:46 PM

## 2022-10-13 NOTE — Progress Notes (Signed)
Progress Note   Patient: Susan [name]:[id number] DOB: 04/23/1961 DOA: 10/12/2022     1 DOS: the patient was seen and examined on 10/13/2022   Brief Hospital course Susan [name] is a 62 y.o. female with past medical history of anemia, anxiety, bipolar, hypertension, hepatitis C, OSA, stroke, tobacco abuse and CVA with right-sided baseline deficits comes back for evaluation of worsening right leg weakness worsening.  Patient had lumbar spine surgery 1 day prior to admission and was having trouble with ambulation as well as incomplete emptying of the bladder with frequency.  Consult requested as patient was found to be hypoxic. Pt is status post L3  -5 decompression with evacuation of epidural hematoma  on 10/12/22 for Cauda Equina Syndrome. Medical consult requested for acute respiratory failure as patient was noted to be hypoxic and currently on 3 L nasal cannula with O2 sats in the high 90s.  Assessment and Plan:  Acute respiratory failure with hypoxia Probably secondary to atelectasis Patient continues to have an oxygen requirement and remains on 3 L to maintain pulse oximetry greater than 92% Continue incentive spirometer, patient advised to use 10 times every hour while awake.    Cauda equina syndrome Epidural fluid collection Status post right L4/5 laminoforaminotomy which was done on 10/10/22 and her intraoperative course was complicated by CSF leak that was repaired intraoperatively. Patient returned to the ER for evaluation of multiple falls at home, right leg weakness/numbness, difficulty ambulating and episodes of urinary incontinence/retention. Had a bladder scan in the ER which showed about 650 cc of urine and a Foley catheter was placed Had an MRI of the lumbar spine which showed at the surgical level and extending from the upper L3 level to the L4-5 disc level, there is a postoperative fluid collection within the spinal canal which causes marked compression of the thecal  sac anteriorly. This is most severe at the level of the L3-4 disc where the fluid collection within the canal measures 1.8 x 1.5 cm in diameter and markedly flattens the thecal sac anteriorly. She was taken emergently to surgery and is status post L3 -5 lumbar decompression with evacuation of epidural hematoma. Further management per neurosurgery   COPD Nicotine dependence Not acutely exacerbated Smoking cessation has been discussed with patient in detail Continue nicotine transdermal patch Continue as needed bronchodilator therapy    Obesity BMI 38.59 Complicates overall prognosis and care Lifestyle modification and exercise has been discussed with patient   Hypertension Continue carvedilol, spironolactone Avapro and amlodipine    Diabetes mellitus, type II Continue Ozempic 1 mg weekly        Subjective: Patient is seen and examined at bedside.  Complains of pain in her lower back  Physical Exam: Vitals:   10/12/22 2145 10/12/22 2252 10/13/22 0445 10/13/22 0726  BP: (!) 105/46 122/68 120/68 (!) 151/73  Pulse: (!) 59 63 72 85  Resp: 14 18 20 14   Temp:  (!) 97.4 F (36.3 C) 98.6 F (37 C) 98 F (36.7 C)  TempSrc:      SpO2: 92% 96% 96% 98%  Weight:      Height:       Physical Exam Vitals and nursing note reviewed.  Constitutional:      Appearance: She is obese.  HENT:     Head: Normocephalic.     Nose: Nose normal.     Mouth/Throat:     Mouth: Mucous membranes are moist.  Eyes:     Conjunctiva/sclera: Conjunctivae normal.  Cardiovascular:     Rate and Rhythm: Normal rate and regular rhythm.  Pulmonary:     Effort: Pulmonary effort is normal.     Breath sounds: Normal breath sounds.  Abdominal:     General: Bowel sounds are normal.     Palpations: Abdomen is soft.     Comments: Central adiposity  Musculoskeletal:        General: Normal range of motion.     Cervical back: Normal range of motion.  Skin:    General: Skin is warm and dry.   Neurological:     Mental Status: She is alert and oriented to person, place, and time.     Motor: Weakness present.  Psychiatric:        Mood and Affect: Mood normal.        Behavior: Behavior normal.     Data Reviewed:  There are no new results to review at this time.  Family Communication: Discussed plan of care with patient.  Disposition: Status is: Inpatient Remains inpatient appropriate because: Pain control  Planned Discharge Destination: Home with Home Health    Time spent: 35 minutes  Author: Lucile Shutters, MD 10/13/2022 11:57 AM  For on call review [email or web address].

## 2022-10-13 NOTE — Plan of Care (Signed)

## 2022-10-13 NOTE — Evaluation (Signed)
Occupational Therapy Evaluation Patient Details Name: Susan [name] MRN: [id number] DOB: 04/18/1961 Today's Date: 10/13/2022   History of Present Illness Susan [name] is a 62 y/o F with PMH: HTN, DM2, HLD, ETOH use, THA, PTSD, cocaine, bipolar, and L thalamic infarct (06/2022), s/p lumbar laminoforaminotomy (10/10/22). Presenting post L3-5 decompression with evacuation of epidural hematoma on 10/12/22 for Cauda Equina Syndrome.   Clinical Impression   Pt was seen for OT evaluation this date. Prior to hospital admission, pt was independent. Pt lives with son, with brother available as needed. Pt presents to acute OT demonstrating impaired ADL performance and functional mobility 2/2 pain, decreased ROM, and balance deficits (See OT problem list for additional functional deficits). Pt currently requires MIN A for UE dressing, MOD A for LE dressing, CGA for bed mobility and transfers. Pt limited by fatigue, pain, and fear of falling. Pt would benefit from skilled OT services to address noted impairments and functional limitations (see below for any additional details) in order to maximize safety and independence while minimizing falls risk and caregiver burden. Anticipate the need for follow up OT services upon acute hospital DC.     Recommendations for follow up therapy are one component of a multi-disciplinary discharge planning process, led by the attending physician.  Recommendations may be updated based on patient status, additional functional criteria and insurance authorization.   Assistance Recommended at Discharge Intermittent Supervision/Assistance  Patient can return home with the following A little help with walking and/or transfers;A little help with bathing/dressing/bathroom;Assistance with cooking/housework;Assist for transportation;Help with stairs or ramp for entrance    Functional Status Assessment  Patient has had a recent decline in their functional status and demonstrates the  ability to make significant improvements in function in a reasonable and predictable amount of time.  Equipment Recommendations  Other (comment) (defer)    Recommendations for Other Services       Precautions / Restrictions Precautions Precautions: Fall;Back Precaution Comments: reviewed BLTs Required Braces or Orthoses: Other Brace (No brace required) Restrictions Weight Bearing Restrictions: No      Mobility Bed Mobility Overal bed mobility: Modified Independent                  Transfers Overall transfer level: Needs assistance Equipment used: Rolling walker (2 wheels) Transfers: Sit to/from Stand Sit to Stand: Min guard           General transfer comment: Pt used RW, expressed fear of falling and shakiness. Vitals maintained adequence on room air. \      Balance Overall balance assessment: Needs assistance Sitting-balance support: Feet supported Sitting balance-Leahy Scale: Fair Sitting balance - Comments: Limited trunk mobility secondary to lower back pain   Standing balance support: Bilateral upper extremity supported Standing balance-Leahy Scale: Fair                             ADL either performed or assessed with clinical judgement   ADL Overall ADL's : Needs assistance/impaired                 Upper Body Dressing : Minimal assistance;Sitting   Lower Body Dressing: Moderate assistance;Sit to/from stand               Functional mobility during ADLs: Min guard;Rolling walker (2 wheels) General ADL Comments: Pt limited by pain to bend forward. Pt reports more pain and lower body numbness since admission.     Vision  Perception     Praxis      Pertinent Vitals/Pain Pain Assessment Pain Assessment: 0-10 Pain Score: 7  Pain Location: low back Pain Descriptors / Indicators: Aching, Numbness Pain Intervention(s): Premedicated before session, Other (comment) (Did not increase with activity)     Hand  Dominance Right   Extremity/Trunk Assessment Upper Extremity Assessment Upper Extremity Assessment: Overall WFL for tasks assessed   Lower Extremity Assessment Lower Extremity Assessment: Generalized weakness RLE Deficits / Details: Pt endorsing numbness/decreased sensation to RLE, worse than normal (had CVA with residual numbness in Feb '24) strength WFLs except decresaed R foot DF, 4/5   Cervical / Trunk Assessment Cervical / Trunk Assessment: Back Surgery   Communication Communication Communication: No difficulties   Cognition Arousal/Alertness: Awake/alert Behavior During Therapy: WFL for tasks assessed/performed Overall Cognitive Status: Within Functional Limits for tasks assessed                                       General Comments       Exercises     Shoulder Instructions      Home Living Family/patient expects to be discharged to:: Private residence Living Arrangements: Children Available Help at Discharge: Family;Friend(s);Available PRN/intermittently;Other (Comment) (Brother and son are available to assist upon return home) Type of Home: House Home Access: Stairs to enter [employer] of Steps: 5 Entrance Stairs-Rails: Right;Left Home Layout: Laundry or work area in basement;One level     Bathroom Shower/Tub: Chief Strategy Officer: Handicapped height Bathroom Accessibility: Yes How Accessible: Accessible via walker Home Equipment: Rolling Walker (2 wheels);Cane - single point;Toilet riser;Grab bars - toilet;Grab bars - tub/shower;Shower seat;Hand held Financial trader (4 wheels)   Additional Comments: Pt states she does not use AD or shower seat at baseline, but has them. Bathroom is equipped with many grab bars 2/2 caring for her mother a while ago.      Prior Functioning/Environment Prior Level of Function : Independent/Modified Independent             Mobility Comments: Indep with ADLs,  household and community mobilization without assist device ADLs Comments: (I) ADLs. Enjoys working in [employer] (states she is retired; sounds as though she volunteers), cutting hair, baking. Drives. Has small dog she cares for. Denies falls. Pt is looking forward to an [place] cruise in August 2024 with a friend.        OT Problem List: Impaired sensation;Decreased knowledge of precautions;Decreased range of motion;Decreased activity tolerance;Impaired balance (sitting and/or standing)      OT Treatment/Interventions: Self-care/ADL training;Therapeutic exercise;Energy conservation;DME and/or AE instruction;Patient/family education    OT Goals(Current goals can be found in the care plan section) Acute Rehab OT Goals Patient Stated Goal: To feel better OT Goal Formulation: With patient Time For Goal Achievement: 10/27/22 Potential to Achieve Goals: Good ADL Goals Pt Will Perform Lower Body Bathing: sit to/from stand;Independently Pt Will Perform Lower Body Dressing: with modified independence;sit to/from stand Pt Will Perform Toileting - Clothing Manipulation and hygiene: with modified independence;sit to/from stand  OT Frequency: Min 1X/week    Co-evaluation PT/OT/SLP Co-Evaluation/Treatment: Yes Reason for Co-Treatment: For patient/therapist safety PT goals addressed during session: Mobility/safety with mobility;Balance;Proper use of DME OT goals addressed during session: ADL's and self-care;Proper use of Adaptive equipment and DME      AM-PAC OT "6 Clicks" Daily Activity     Outcome Measure Help from another person eating  meals?: None Help from another person taking care of personal grooming?: A Little Help from another person toileting, which includes using toliet, bedpan, or urinal?: A Little Help from another person bathing (including washing, rinsing, drying)?: A Little Help from another person to put on and taking off regular upper body clothing?: A Little Help from  another person to put on and taking off regular lower body clothing?: A Little 6 Click Score: 19   End of Session Equipment Utilized During Treatment: Rolling walker (2 wheels);Gait belt  Activity Tolerance: Patient tolerated treatment well Patient left: in bed;with call bell/phone within reach;with bed alarm set;with nursing/sitter in room  OT Visit Diagnosis: Unsteadiness on feet (R26.81);Other abnormalities of gait and mobility (R26.89);Pain Pain - part of body:  (Lower  back)                Time: 1610-9604 OT Time Calculation (min): 29 min Charges:  OT General Charges $OT Visit: 1 Visit OT Evaluation $OT Eval Moderate Complexity: 1 Mod [address], OTS

## 2022-10-13 NOTE — TOC Initial Note (Signed)
Transition of Care [facility]) - Initial/Assessment Note    Patient Details  Name: Susan [name] MRN: [id number] Date of Birth: [birth year]  Transition of Care [facility]) CM/SW Contact:    Margarito Liner, LCSW Phone Number: 10/13/2022, 4:23 PM  Clinical Narrative:   Readmission prevention screen complete. CSW met with patient. No supports at bedside. CSW introduced role and explained that discharge planning would be discussed. PCP is [name], MD. Patient typically drives herself to appointments. She uses [facility] in [place] which mails her prescriptions to her. If she needs a new medication or pain medication, she uses [facility] on the corner or [employer] and [employer]. No issues obtaining medications. Patient's son lives in her basement but he is never home. No home health prior to admission. She has a high-rise toilet, shower bars, BSC, shower chair, walkers, rollator, and canes. Patient is agreeable to SNF placement. Gave CMS scores for facilities within 25 miles of her zip code. Will follow up with bed offers once available. No further concerns. CSW encouraged patient to contact CSW as needed. CSW will continue to follow patient for support and facilitate return home once stable.               Expected Discharge Plan: Skilled Nursing Facility Barriers to Discharge: Continued Medical Work up   Patient Goals and CMS Choice   CMS Medicare.gov Compare Post Acute Care list provided to:: Patient        Expected Discharge Plan and Services     Post Acute Care Choice: Skilled Nursing Facility Living arrangements for the past 2 months: Single Family Home                                      Prior Living Arrangements/Services Living arrangements for the past 2 months: Single Family Home Lives with:: Self Patient language and need for interpreter reviewed:: Yes Do you feel safe going back to the place where you live?: Yes      Need for Family  Participation in Patient Care: Yes (Comment)   Current home services: DME Criminal Activity/Legal Involvement Pertinent to Current Situation/Hospitalization: No - Comment as needed  Activities of Daily Living Home Assistive Devices/Equipment: CPAP ADL Screening (condition at time of admission) Patient's cognitive ability adequate to safely complete daily activities?: Yes Is the patient deaf or have difficulty hearing?: No Does the patient have difficulty seeing, even when wearing glasses/contacts?: No Does the patient have difficulty concentrating, remembering, or making decisions?: No Patient able to express need for assistance with ADLs?: Yes Does the patient have difficulty dressing or bathing?: No Independently performs ADLs?: Yes (appropriate for developmental age) Does the patient have difficulty walking or climbing stairs?: Yes Weakness of Legs: Right Weakness of Arms/Hands: None  Permission Sought/Granted Permission sought to share information with : Facility Industrial/product designer granted to share information with : Yes, Verbal Permission Granted     Permission granted to share info w AGENCY: SNF's        Emotional Assessment Appearance:: Appears stated age Attitude/Demeanor/Rapport: Engaged, Gracious Affect (typically observed): Accepting, Appropriate, Calm, Pleasant Orientation: : Oriented to Self, Oriented to Place, Oriented to  Time, Oriented to Situation Alcohol / Substance Use: Not Applicable Psych Involvement: No (comment)  Admission diagnosis:  Urinary retention [R33.9] Weakness [R53.1] Cauda equina syndrome (HCC) [G83.4] Patient Active Problem List   Diagnosis Date Noted  Acute respiratory failure with hypoxia (HCC) 10/13/2022   AKI (acute kidney injury) (HCC) 10/13/2022   Anemia 10/13/2022   Cauda equina syndrome (HCC) 10/12/2022   Epidural hematoma (HCC) 10/12/2022   Mass in epidural space 10/12/2022   Synovial cyst of lumbar facet joint  10/10/2022   Lumbar radiculopathy 10/10/2022   S/P laminectomy 10/10/2022   Preop examination 09/28/2022   Epigastric pain 07/22/2022   Enlarged thyroid 07/22/2022   Diarrhea 07/22/2022   CVA (cerebrovascular accident) (HCC) 07/05/2022   Skin lesion 06/28/2022   BMI 38.0-38.9,adult 07/14/2021   Type 2 diabetes mellitus without complication, without long-term current use of insulin (HCC) 06/16/2021   H/O thyroid nodule 05/31/2021   History of hepatitis C- treated 2014- 2015 with [facility] gastroenterology and followed.  05/19/2021   Left carotid bruit 04/28/2021   Anxiety 05/28/2020   Bipolar affective disorder in remission (HCC) 05/28/2020   Bipolar disorder, in full remission, most recent episode mixed (HCC) 03/06/2020   Need for immunization against influenza 01/27/2020   Routine physical examination 01/27/2020   Morbid obesity (HCC) 01/27/2020   Osteoporosis 11/06/2019   Bipolar 1 disorder, mixed, mild (HCC) 10/29/2019   PTSD (post-traumatic stress disorder) 09/27/2019   Bereavement 09/27/2019   Cocaine use disorder, moderate, in sustained remission (HCC) 09/27/2019   Status post total hip replacement, left 07/23/2019   Primary osteoarthritis of left hip 06/14/2019   Depression 10/23/2018   Allergic rhinitis 08/23/2018   Aortic atherosclerosis (HCC) 02/20/2018   Centrilobular emphysema (HCC) 02/20/2018   Hyperlipidemia 03/28/2017   Degenerative disc disease at L5-S1 level 09/28/2016   Tobacco use disorder 04/21/2016   GERD (gastroesophageal reflux disease) 12/17/2015   Chronic back pain 10/09/2015   Post-traumatic osteoarthritis of right knee 09/24/2015   Alcohol use disorder, severe, in sustained remission (HCC) 04/13/2015   Vitamin D deficiency 04/09/2015   Vitamin B12 deficiency 04/09/2015   OSA on CPAP 04/09/2015   Subclinical hyperthyroidism 06/26/2014   Goiter colloid, toxic, nodular 06/26/2014   Hypertension 06/26/2014   Tobacco use disorder, continuous 06/26/2014    Bipolar I disorder (HCC) 06/26/2014   Lumbar radiculitis 10/03/2013   Diverticulosis 08/24/2013   PCP:  Glori Luis, MD Pharmacy:   [id number] [place], [place] - [address] AT [facility] [address] [place] [address] Phone: [phone] Fax: [phone]  [facility] - [facility] [place], [place] [address] La[address]ll [address]s [address] La[address] Josephs [address] [place] [address] Phone: [phone] Fax: [phone]     Social Determinants of Health (SDOH) Social History: SDOH Screenings   Food Insecurity: No Food Insecurity (10/12/2022)  Housing: Patient Declined (10/12/2022)  Transportation Needs: No Transportation Needs (10/12/2022)  Utilities: Not At Risk (10/12/2022)  Alcohol Screen: Low Risk  (06/02/2021)  Depression (PHQ2-9): Low Risk  (09/28/2022)  Recent Concern: Depression (PHQ2-9) - High Risk (08/31/2022)  Financial Resource Strain: Low Risk  (06/02/2021)  Physical Activity: Sufficiently Active (06/02/2021)  Social Connections: Moderately Isolated (06/02/2021)  Stress: No Stress Concern Present (06/02/2021)  Tobacco Use: High Risk (10/13/2022)   SDOH Interventions:     Readmission Risk Interventions    10/13/2022    4:20 PM  Readmission Risk Prevention Plan  Transportation Screening Complete  PCP or Specialist Appt within 5-7 Days Complete  Medication Review (RN CM) Complete

## 2022-10-14 DIAGNOSIS — J9601 Acute respiratory failure with hypoxia: Secondary | ICD-10-CM | POA: Diagnosis not present

## 2022-10-14 LAB — GLUCOSE, CAPILLARY
Glucose-Capillary: 106 mg/dL — ABNORMAL HIGH (ref 70–99)
Glucose-Capillary: 100 mg/dL — ABNORMAL HIGH (ref 70–99)
Glucose-Capillary: 101 mg/dL — ABNORMAL HIGH (ref 70–99)
Glucose-Capillary: 119 mg/dL — ABNORMAL HIGH (ref 70–99)

## 2022-10-14 MED ORDER — POLYETHYLENE GLYCOL 3350 17 G PO PACK
17.0000 g | PACK | Freq: Every day | ORAL | Status: DC
  Administered 2022-10-14 – 2022-10-18 (×4): 17 g via ORAL
  Filled 2022-10-14 (×5): qty 1

## 2022-10-14 NOTE — Plan of Care (Signed)
Problem: Education: Goal: Ability to verbalize activity precautions or restrictions will improve 10/14/2022 0328 by Rosita Kea, LPN Outcome: Progressing 10/14/2022 0205 by Rosita Kea, LPN Outcome: Progressing Goal: Knowledge of the prescribed therapeutic regimen will improve 10/14/2022 0328 by Rosita Kea, LPN Outcome: Progressing 10/14/2022 0205 by Rosita Kea, LPN Outcome: Progressing Goal: Understanding of discharge needs will improve 10/14/2022 0328 by Rosita Kea, LPN Outcome: Progressing 10/14/2022 0205 by Rosita Kea, LPN Outcome: Progressing   Problem: Activity: Goal: Ability to avoid complications of mobility impairment will improve 10/14/2022 0328 by Rosita Kea, LPN Outcome: Progressing 10/14/2022 0205 by Rosita Kea, LPN Outcome: Progressing Goal: Ability to tolerate increased activity will improve 10/14/2022 0328 by Rosita Kea, LPN Outcome: Progressing 10/14/2022 0205 by Rosita Kea, LPN Outcome: Progressing Goal: Will remain free from falls 10/14/2022 0328 by Rosita Kea, LPN Outcome: Progressing 10/14/2022 0205 by Rosita Kea, LPN Outcome: Progressing   Problem: Bowel/Gastric: Goal: Gastrointestinal status for postoperative course will improve 10/14/2022 0328 by Rosita Kea, LPN Outcome: Progressing 10/14/2022 0205 by Rosita Kea, LPN Outcome: Progressing   Problem: Clinical Measurements: Goal: Ability to maintain clinical measurements within normal limits will improve Outcome: Progressing Goal: Postoperative complications will be avoided or minimized Outcome: Progressing Goal: Diagnostic test results will improve Outcome: Progressing   Problem: Pain Management: Goal: Pain level will decrease 10/14/2022 0328 by Rosita Kea, LPN Outcome: Progressing 10/14/2022 0205 by Rosita Kea, LPN Outcome: Progressing   Problem: Skin Integrity: Goal: Will show signs of wound healing Outcome: Progressing   Problem: Health Behavior/Discharge  Planning: Goal: Identification of resources available to assist in meeting health care needs will improve Outcome: Progressing   Problem: Bladder/Genitourinary: Goal: Urinary functional status for postoperative course will improve Outcome: Progressing   Problem: Education: Goal: Knowledge of General Education information will improve Description: Including pain rating scale, medication(s)/side effects and non-pharmacologic comfort measures 10/14/2022 0328 by Rosita Kea, LPN Outcome: Progressing 10/14/2022 0205 by Rosita Kea, LPN Outcome: Progressing   Problem: Health Behavior/Discharge Planning: Goal: Ability to manage health-related needs will improve Outcome: Progressing   Problem: Clinical Measurements: Goal: Ability to maintain clinical measurements within normal limits will improve 10/14/2022 0328 by Rosita Kea, LPN Outcome: Progressing 10/14/2022 0205 by Rosita Kea, LPN Outcome: Progressing Goal: Will remain free from infection 10/14/2022 0328 by Rosita Kea, LPN Outcome: Progressing 10/14/2022 0205 by Rosita Kea, LPN Outcome: Progressing Goal: Diagnostic test results will improve Outcome: Progressing Goal: Respiratory complications will improve 10/14/2022 0328 by Rosita Kea, LPN Outcome: Progressing 10/14/2022 0205 by Rosita Kea, LPN Outcome: Progressing Goal: Cardiovascular complication will be avoided Outcome: Progressing   Problem: Activity: Goal: Risk for activity intolerance will decrease Outcome: Progressing   Problem: Nutrition: Goal: Adequate nutrition will be maintained Outcome: Progressing   Problem: Coping: Goal: Level of anxiety will decrease Outcome: Progressing   Problem: Elimination: Goal: Will not experience complications related to bowel motility Outcome: Progressing Goal: Will not experience complications related to urinary retention Outcome: Progressing   Problem: Pain Managment: Goal: General experience of comfort will  improve Outcome: Progressing   Problem: Safety: Goal: Ability to remain free from injury will improve Outcome: Progressing   Problem: Skin Integrity: Goal: Risk for impaired skin integrity will decrease Outcome: Progressing   Problem: Education: Goal: Ability to describe self-care measures that may prevent or decrease complications (Diabetes Survival Skills Education) will improve Outcome: Progressing Goal: Individualized Educational Video(s)  Outcome: Progressing   Problem: Coping: Goal: Ability to adjust to condition or change in health will improve Outcome: Progressing   Problem: Fluid Volume: Goal: Ability to maintain a balanced intake and output will improve Outcome: Progressing   Problem: Health Behavior/Discharge Planning: Goal: Ability to identify and utilize available resources and services will improve Outcome: Progressing Goal: Ability to manage health-related needs will improve Outcome: Progressing   Problem: Metabolic: Goal: Ability to maintain appropriate glucose levels will improve Outcome: Progressing   Problem: Nutritional: Goal: Maintenance of adequate nutrition will improve Outcome: Progressing Goal: Progress toward achieving an optimal weight will improve Outcome: Progressing   Problem: Skin Integrity: Goal: Risk for impaired skin integrity will decrease Outcome: Progressing   Problem: Tissue Perfusion: Goal: Adequacy of tissue perfusion will improve Outcome: Progressing

## 2022-10-14 NOTE — Progress Notes (Signed)
This patient claimed that she had a fall. The tech told me to come in and when I got there the patent was already in the bed. The tech also stated that she was already in the bed when she came in the room. Patient stated she landed on her right butt cheek, and didn't hit her head. She also states that she is not having any pain. Vitals were taken and assessment was done. MD was notified. Patient refused to have family known.

## 2022-10-14 NOTE — Progress Notes (Signed)
   Neurosurgery Progress Note  History: Susan [name] is s/p L3-5 decompression with epidural hematoma evacuation for cauda equina.   POD2: NAEO. Pt reporting improvement numbness and strength in RLE POD1: Patient reports improvement of her leg pain as well as some improvement in her lower extremity weakness.  Her immediate postop recovery was complicated by acute respiratory failure with hypoxia.  Internal medicine was consulted for further evaluation and treatment  Physical Exam: Vitals:   10/14/22 0441 10/14/22 0837  BP: 136/69 138/72  Pulse: 70 (!) 59  Resp: 18 18  Temp: 97.7 F (36.5 C) 98.3 F (36.8 C)  SpO2: 100% 95%    AA Ox3 CNI  Strength:LLE- 5/5 throughout. RLE- 4+/5 HF, KE. 3/5 DF 2/5 EHL and PF  HV output 50 yesterday  Data:  Other tests/results: Not applicable to her recent surgery.  Please see hospitalist consult note for further details  Assessment/Plan:  Susan [name] is a 62 year old female who underwent a right L4-5 laminoforaminotomy for synovial cyst resection on 10/10/2022.  This was complicated by CSF leak which was repaired intraoperatively.  Patient represented to the ER on postop day 2 with symptoms concerning for cauda equina.  She underwent a L3-5 decompression and evacuation of epidural hematoma on 10/12/2022.  She experienced hypoxia postoperatively and internal medicine was consulted for assistance.  - mobilize - pain control - DVT prophylaxis - HV removed 10/14/22 - PTOT -We appreciate internal medicine's assistance with additional medical concerns  Manning Charity PA-C Department of Neurosurgery

## 2022-10-14 NOTE — Care Management Important Message (Signed)
Important Message  Patient Details  Name: Susan [name] MRN: [id number] Date of Birth: 07/15/1960   Medicare Important Message Given:  N/A - LOS <3 / Initial given by admissions     Olegario Messier A Brittannie Tawney 10/14/2022, 2:16 PM

## 2022-10-14 NOTE — Progress Notes (Signed)
Occupational Therapy Treatment Patient Details Name: Susan [name] MRN: [id number] DOB: Jul 27, 1960 Today's Date: 10/14/2022   History of present illness Susan [name] is a 62 y/o F with PMH: HTN, DM2, HLD, ETOH use, THA, PTSD, cocaine, bipolar, and L thalamic infarct (06/2022), s/p lumbar laminoforaminotomy (10/10/22). Presenting post L3-5 decompression with evacuation of epidural hematoma on 10/12/22 for Cauda Equina Syndrome.   OT comments  Ms Kolesnikov was seen for OT treatment on this date. Upon arrival to room pt reclined in bed, agreeable to tx. Pt requires CGA + RW toilet t/f, standing pericare and hand washing. MAX A for LB access seated EOB. Tolerates ~10 ft x3 with seated rest breaks. Pt making good progress toward goals, will continue to follow POC. Discharge recommendation remains appropriate.     Recommendations for follow up therapy are one component of a multi-disciplinary discharge planning process, led by the attending physician.  Recommendations may be updated based on patient status, additional functional criteria and insurance authorization.    Assistance Recommended at Discharge Intermittent Supervision/Assistance  Patient can return home with the following  A little help with walking and/or transfers;A little help with bathing/dressing/bathroom;Assistance with cooking/housework;Assist for transportation;Help with stairs or ramp for entrance   Equipment Recommendations  Other (comment) (defer)    Recommendations for Other Services      Precautions / Restrictions Precautions Precautions: Fall;Back Restrictions Weight Bearing Restrictions: No       Mobility Bed Mobility Overal bed mobility: Modified Independent                  Transfers Overall transfer level: Needs assistance Equipment used: Rolling walker (2 wheels) Transfers: Sit to/from Stand Sit to Stand: Min guard, From elevated surface                 Balance Overall balance assessment:  Needs assistance Sitting-balance support: Feet supported Sitting balance-Leahy Scale: Fair     Standing balance support: No upper extremity supported, During functional activity Standing balance-Leahy Scale: Fair                             ADL either performed or assessed with clinical judgement   ADL Overall ADL's : Needs assistance/impaired                                       General ADL Comments: CGA + RW toilet t/f, standing pericare and hand washing. MAX A for LB access seated EOB.      Cognition Arousal/Alertness: Awake/alert Behavior During Therapy: WFL for tasks assessed/performed Overall Cognitive Status: Within Functional Limits for tasks assessed                                           Pertinent Vitals/ Pain       Pain Assessment Pain Assessment: 0-10 Pain Score: 3  Pain Location: low back Pain Descriptors / Indicators: Burning Pain Intervention(s): Limited activity within patient's tolerance, Premedicated before session   Frequency  Min 1X/week        Progress Toward Goals  OT Goals(current goals can now be found in the care plan section)  Progress towards OT goals: Progressing toward goals  Acute Rehab OT Goals Patient Stated Goal: to return to PLOF OT Goal  Formulation: With patient Time For Goal Achievement: 10/27/22 Potential to Achieve Goals: Good ADL Goals Pt Will Perform Lower Body Bathing: sit to/from stand;Independently Pt Will Perform Lower Body Dressing: with modified independence;sit to/from stand Pt Will Perform Toileting - Clothing Manipulation and hygiene: with modified independence;sit to/from stand  Plan Discharge plan remains appropriate;Frequency remains appropriate    Co-evaluation                 AM-PAC OT "6 Clicks" Daily Activity     Outcome Measure   Help from another person eating meals?: None Help from another person taking care of personal grooming?: A  Little Help from another person toileting, which includes using toliet, bedpan, or urinal?: A Little Help from another person bathing (including washing, rinsing, drying)?: A Little Help from another person to put on and taking off regular upper body clothing?: A Little Help from another person to put on and taking off regular lower body clothing?: A Little 6 Click Score: 19    End of Session    OT Visit Diagnosis: Unsteadiness on feet (R26.81);Other abnormalities of gait and mobility (R26.89);Pain   Activity Tolerance Patient tolerated treatment well   Patient Left in bed;with call bell/phone within reach   Nurse Communication          Time: 1043-1101 OT Time Calculation (min): 18 min  Charges: OT General Charges $OT Visit: 1 Visit OT Treatments $Self Care/Home Management : 8-22 mins  Kathie Dike, M.S. OTR/L  10/14/22, 11:53 AM  ascom [phone]

## 2022-10-14 NOTE — Plan of Care (Signed)
  Problem: Education: Goal: Ability to verbalize activity precautions or restrictions will improve Outcome: Progressing Goal: Knowledge of the prescribed therapeutic regimen will improve Outcome: Progressing Goal: Understanding of discharge needs will improve Outcome: Progressing   Problem: Activity: Goal: Ability to avoid complications of mobility impairment will improve Outcome: Progressing Goal: Ability to tolerate increased activity will improve Outcome: Progressing Goal: Will remain free from falls Outcome: Progressing   Problem: Bowel/Gastric: Goal: Gastrointestinal status for postoperative course will improve Outcome: Progressing   Problem: Clinical Measurements: Goal: Ability to maintain clinical measurements within normal limits will improve Outcome: Progressing Goal: Postoperative complications will be avoided or minimized Outcome: Progressing Goal: Diagnostic test results will improve Outcome: Progressing   Problem: Pain Management: Goal: Pain level will decrease Outcome: Progressing   Problem: Skin Integrity: Goal: Will show signs of wound healing Outcome: Progressing   Problem: Health Behavior/Discharge Planning: Goal: Identification of resources available to assist in meeting health care needs will improve Outcome: Progressing   Problem: Bladder/Genitourinary: Goal: Urinary functional status for postoperative course will improve Outcome: Progressing   Problem: Education: Goal: Knowledge of General Education information will improve Description: Including pain rating scale, medication(s)/side effects and non-pharmacologic comfort measures Outcome: Progressing   Problem: Health Behavior/Discharge Planning: Goal: Ability to manage health-related needs will improve Outcome: Progressing   Problem: Clinical Measurements: Goal: Ability to maintain clinical measurements within normal limits will improve Outcome: Progressing Goal: Will remain free from  infection Outcome: Progressing Goal: Diagnostic test results will improve Outcome: Progressing Goal: Respiratory complications will improve Outcome: Progressing Goal: Cardiovascular complication will be avoided Outcome: Progressing   Problem: Activity: Goal: Risk for activity intolerance will decrease Outcome: Progressing   Problem: Nutrition: Goal: Adequate nutrition will be maintained Outcome: Progressing   Problem: Coping: Goal: Level of anxiety will decrease Outcome: Progressing   Problem: Elimination: Goal: Will not experience complications related to bowel motility Outcome: Progressing Goal: Will not experience complications related to urinary retention Outcome: Progressing   Problem: Pain Managment: Goal: General experience of comfort will improve Outcome: Progressing   Problem: Safety: Goal: Ability to remain free from injury will improve Outcome: Progressing   Problem: Skin Integrity: Goal: Risk for impaired skin integrity will decrease Outcome: Progressing   Problem: Education: Goal: Ability to describe self-care measures that may prevent or decrease complications (Diabetes Survival Skills Education) will improve Outcome: Progressing Goal: Individualized Educational Video(s) Outcome: Progressing   Problem: Coping: Goal: Ability to adjust to condition or change in health will improve Outcome: Progressing   Problem: Fluid Volume: Goal: Ability to maintain a balanced intake and output will improve Outcome: Progressing   Problem: Health Behavior/Discharge Planning: Goal: Ability to identify and utilize available resources and services will improve Outcome: Progressing Goal: Ability to manage health-related needs will improve Outcome: Progressing   Problem: Metabolic: Goal: Ability to maintain appropriate glucose levels will improve Outcome: Progressing   Problem: Nutritional: Goal: Maintenance of adequate nutrition will improve Outcome:  Progressing Goal: Progress toward achieving an optimal weight will improve Outcome: Progressing

## 2022-10-14 NOTE — Progress Notes (Signed)
Progress Note   Patient: Susan [name] ZOX:[id number] DOB: Mar 23, 1961 DOA: 10/12/2022     2 DOS: the patient was seen and examined on 10/14/2022   Brief hospital course:  Susan [name] is a 62 y.o. female with past medical history of anemia, anxiety, bipolar, hypertension, hepatitis C, OSA, stroke, tobacco abuse and CVA with right-sided baseline deficits comes back for evaluation of worsening right leg weakness worsening.  Patient had lumbar spine surgery 1 day prior to admission and was having trouble with ambulation as well as incomplete emptying of the bladder with frequency.  Consult requested as patient was found to be hypoxic. Pt is status post L3  -5 decompression with evacuation of epidural hematoma  on 10/12/22 for Cauda Equina Syndrome. Medical consult requested for acute respiratory failure as patient was noted to be hypoxic and currently on 3 L nasal cannula with O2 sats in the high 90s.    Assessment and Plan:  Acute respiratory failure with hypoxia Probably secondary to atelectasis Patient has been weaned off oxygen and appears comfortable on room air Continue incentive spirometer, patient advised to use 10 times every hour while awake.       Cauda equina syndrome Epidural fluid collection Status post right L4/5 laminoforaminotomy which was done on 10/10/22 and her intraoperative course was complicated by CSF leak that was repaired intraoperatively. Patient returned to the ER for evaluation of multiple falls at home, right leg weakness/numbness, difficulty ambulating and episodes of urinary incontinence/retention. Had a bladder scan in the ER which showed about 650 cc of urine and a Foley catheter was placed Had an MRI of the lumbar spine which showed at the surgical level and extending from the upper L3 level to the L4-5 disc level, there is a postoperative fluid collection within the spinal canal which causes marked compression of the thecal sac anteriorly. This is most  severe at the level of the L3-4 disc where the fluid collection within the canal measures 1.8 x 1.5 cm in diameter and markedly flattens the thecal sac anteriorly. She was taken emergently to surgery and is status post L3 -5 lumbar decompression with evacuation of epidural hematoma. (POD 2) Further management per neurosurgery     COPD Nicotine dependence Not acutely exacerbated Smoking cessation has been discussed with patient in detail Continue nicotine transdermal patch Continue as needed bronchodilator therapy       Obesity BMI 38.59 Complicates overall prognosis and care Lifestyle modification and exercise has been discussed with patient     Hypertension Continue carvedilol, spironolactone Avapro and amlodipine       Diabetes mellitus, type II Continue Ozempic 1 mg weekly                  Subjective: Patient is seen and examined at the bedside.  Has no new complaints  Physical Exam: Vitals:   10/13/22 2125 10/14/22 0007 10/14/22 0441 10/14/22 0837  BP: (!) 117/56 122/60 136/69 138/72  Pulse: 68 67 70 64  Resp: 18 18 18 18   Temp: 97.9 F (36.6 C) 97.8 F (36.6 C) 97.7 F (36.5 C) 98.3 F (36.8 C)  TempSrc:    Oral  SpO2: 95% 96% 100% 95%  Weight:      Height:       Vitals and nursing note reviewed.  Constitutional:      Appearance: She is obese.  HENT:     Head: Normocephalic.     Nose: Nose normal.     Mouth/Throat:  Mouth: Mucous membranes are moist.  Eyes:     Conjunctiva/sclera: Conjunctivae normal.  Cardiovascular:     Rate and Rhythm: Normal rate and regular rhythm.  Pulmonary:     Effort: Pulmonary effort is normal.     Breath sounds: Normal breath sounds.  Abdominal:     General: Bowel sounds are normal.     Palpations: Abdomen is soft.     Comments: Central adiposity  Musculoskeletal:        General: Normal range of motion.     Cervical back: Normal range of motion.  Skin:    General: Skin is warm and dry.  Neurological:      Mental Status: She is alert and oriented to person, place, and time.     Motor: Weakness present.  Psychiatric:        Mood and Affect: Mood normal.        Behavior: Behavior normal.  Data Reviewed:  There are no new results to review at this time.  Family Communication: Plan of care discussed with patient at the bedside.  She verbalizes understanding and agrees with the plan.  Disposition: Status is: Inpatient Remains inpatient appropriate because: Awaiting placement  Planned Discharge Destination: Skilled nursing facility    Time spent: 30 minutes  Author: Lucile Shutters, MD 10/14/2022 1:42 PM  For on call review [email or web address].

## 2022-10-14 NOTE — Progress Notes (Signed)
Physical Therapy Treatment Patient Details Name: Susan [name] MRN: [id number] DOB: 1960-06-17 Today's Date: 10/14/2022   History of Present Illness Susan [name] is a 62 y/o F with PMH: HTN, DM2, HLD, ETOH use, THA, PTSD, cocaine, bipolar, and L thalamic infarct (06/2022), s/p lumbar laminoforaminotomy (10/10/22). Presenting post L3-5 decompression with evacuation of epidural hematoma on 10/12/22 for Cauda Equina Syndrome.    PT Comments    Pt received in Semi-Fowler's position and agreeable to therapy.  Pt notes fatigue but is willing to ambulate with therapist in order to improve overall function of the LE's.  Pt with increased steadiness and a reduction in shaky symptoms present from evaluation yesterday.  Pt also noted to have increased proprioception and feeling in the LE's.  Pt able to ambulate around the nursing pod and back to room where she was left with all needs met.  Pt and therapist discussed discharge options at conclusion of the session.       Recommendations for follow up therapy are one component of a multi-disciplinary discharge planning process, led by the attending physician.  Recommendations may be updated based on patient status, additional functional criteria and insurance authorization.  Follow Up Recommendations       Assistance Recommended at Discharge Set up Supervision/Assistance  Patient can return home with the following A little help with walking and/or transfers;A little help with bathing/dressing/bathroom;Assistance with cooking/housework;Assist for transportation;Help with stairs or ramp for entrance   Equipment Recommendations  None recommended by PT    Recommendations for Other Services       Precautions / Restrictions Precautions Precautions: Fall;Back Restrictions Weight Bearing Restrictions: No     Mobility  Bed Mobility Overal bed mobility: Modified Independent                  Transfers Overall transfer level: Needs  assistance Equipment used: Rolling walker (2 wheels) Transfers: Sit to/from Stand Sit to Stand: From elevated surface, Min guard                Ambulation/Gait Ambulation/Gait assistance: Min guard Gait Distance (Feet): 100 Feet Assistive device: Rolling walker (2 wheels)   Gait velocity: decreased     General Gait Details: Pt with much more steadiness when ambulating during session.   Stairs             Wheelchair Mobility    Modified Rankin (Stroke Patients Only)       Balance Overall balance assessment: Needs assistance Sitting-balance support: Feet supported Sitting balance-Leahy Scale: Fair     Standing balance support: No upper extremity supported, During functional activity Standing balance-Leahy Scale: Fair                              Cognition Arousal/Alertness: Awake/alert Behavior During Therapy: WFL for tasks assessed/performed Overall Cognitive Status: Within Functional Limits for tasks assessed                                          Exercises      General Comments        Pertinent Vitals/Pain Pain Assessment Pain Assessment: 0-10 Pain Score: 3  Pain Location: low back Pain Descriptors / Indicators: Burning Pain Intervention(s): Limited activity within patient's tolerance, Premedicated before session    Home Living  Prior Function            PT Goals (current goals can now be found in the care plan section) Acute Rehab PT Goals Patient Stated Goal: to go home PT Goal Formulation: With patient Time For Goal Achievement: 10/27/22 Potential to Achieve Goals: Good Progress towards PT goals: Progressing toward goals    Frequency    7X/week      PT Plan Current plan remains appropriate    Co-evaluation              AM-PAC PT "6 Clicks" Mobility   Outcome Measure  Help needed turning from your back to your side while in a flat bed without  using bedrails?: None Help needed moving from lying on your back to sitting on the side of a flat bed without using bedrails?: None Help needed moving to and from a bed to a chair (including a wheelchair)?: None Help needed standing up from a chair using your arms (e.g., wheelchair or bedside chair)?: A Little Help needed to walk in hospital room?: A Lot Help needed climbing 3-5 steps with a railing? : A Lot 6 Click Score: 19    End of Session Equipment Utilized During Treatment: Gait belt Activity Tolerance: Patient tolerated treatment well Patient left: in bed;with call bell/phone within reach;with bed alarm set Nurse Communication: Mobility status PT Visit Diagnosis: Other abnormalities of gait and mobility (R26.89);Muscle weakness (generalized) (M62.81);Pain Pain - Right/Left:  (midline LBP) Pain - part of body:  (back)     Time: 0981-1914 PT Time Calculation (min) (ACUTE ONLY): 13 min  Charges:  $Therapeutic Activity: 8-22 mins                     Nolon Bussing, PT, DPT Physical Therapist - [facility]  10/14/22, 2:01 PM

## 2022-10-15 DIAGNOSIS — J9601 Acute respiratory failure with hypoxia: Secondary | ICD-10-CM | POA: Diagnosis not present

## 2022-10-15 LAB — GLUCOSE, CAPILLARY
Glucose-Capillary: 114 mg/dL — ABNORMAL HIGH (ref 70–99)
Glucose-Capillary: 111 mg/dL — ABNORMAL HIGH (ref 70–99)
Glucose-Capillary: 109 mg/dL — ABNORMAL HIGH (ref 70–99)
Glucose-Capillary: 130 mg/dL — ABNORMAL HIGH (ref 70–99)

## 2022-10-15 MED ORDER — MAGNESIUM CITRATE PO SOLN
1.0000 | Freq: Once | ORAL | Status: AC
  Administered 2022-10-15: 1 via ORAL
  Filled 2022-10-15: qty 296

## 2022-10-15 NOTE — Progress Notes (Signed)
Progress Note   Patient: Susan [name] ZOX:[id number] DOB: 12/10/1960 DOA: 10/12/2022     3 DOS: the patient was seen and examined on 10/15/2022   Brief hospital course:  Susan [name] is a 62 y.o. female with past medical history of anemia, anxiety, bipolar, hypertension, hepatitis C, OSA, stroke, tobacco abuse and CVA with right-sided baseline deficits comes back for evaluation of worsening right leg weakness worsening.  Patient had lumbar spine surgery 1 day prior to admission and was having trouble with ambulation as well as incomplete emptying of the bladder with frequency.  Consult requested as patient was found to be hypoxic. Pt is status post L3  -5 decompression with evacuation of epidural hematoma  on 10/12/22 for Cauda Equina Syndrome. Medical consult requested for acute respiratory failure as patient was noted to be hypoxic and currently on 3 L nasal cannula with O2 sats in the high 90s.  Assessment and Plan:  Acute respiratory failure with hypoxia Probably secondary to atelectasis Patient has been weaned off oxygen and appears comfortable on room air Continue incentive spirometer, patient advised to use 10 times every hour while awake.       Cauda equina syndrome Epidural fluid collection Status post right L4/5 laminoforaminotomy which was done on 10/10/22 and her intraoperative course was complicated by CSF leak that was repaired intraoperatively. Patient returned to the ER for evaluation of multiple falls at home, right leg weakness/numbness, difficulty ambulating and episodes of urinary incontinence/retention. Had a bladder scan in the ER which showed about 650 cc of urine and a Foley catheter was placed Had an MRI of the lumbar spine which showed at the surgical level and extending from the upper L3 level to the L4-5 disc level, there is a postoperative fluid collection within the spinal canal which causes marked compression of the thecal sac anteriorly. This is most  severe at the level of the L3-4 disc where the fluid collection within the canal measures 1.8 x 1.5 cm in diameter and markedly flattens the thecal sac anteriorly. She was taken emergently to surgery and is status post L3 -5 lumbar decompression with evacuation of epidural hematoma. (POD 3) Further management per neurosurgery     COPD Nicotine dependence Not acutely exacerbated Smoking cessation has been discussed with patient in detail Continue nicotine transdermal patch Continue as needed bronchodilator therapy       Obesity BMI 38.59 Complicates overall prognosis and care Lifestyle modification and exercise has been discussed with patient     Hypertension Continue carvedilol, spironolactone Avapro and amlodipine       Diabetes mellitus, type II Continue Ozempic 1 mg weekly     Fall precautions        Subjective: Patient is seen and examined at the bedside.  No new complaints   Physical Exam: Vitals:   10/14/22 2044 10/15/22 0009 10/15/22 0452 10/15/22 0747  BP: (!) 151/57 135/73 136/70 (!) 158/85  Pulse: 77 60 64 75  Resp: 18 18 18 18   Temp: 98 F (36.7 C) 98 F (36.7 C) 97.7 F (36.5 C) 98.1 F (36.7 C)  TempSrc:      SpO2: 99% 96% 94% 100%  Weight:      Height:       Vitals and nursing note reviewed.  Constitutional:      Appearance: She is obese.  HENT:     Head: Normocephalic.     Nose: Nose normal.     Mouth/Throat:     Mouth: Mucous membranes  are moist.  Eyes:     Conjunctiva/sclera: Conjunctivae normal.  Cardiovascular:     Rate and Rhythm: Normal rate and regular rhythm.  Pulmonary:     Effort: Pulmonary effort is normal.     Breath sounds: Normal breath sounds.  Abdominal:     General: Bowel sounds are normal.     Palpations: Abdomen is soft.     Comments: Central adiposity  Musculoskeletal:        General: Normal range of motion.     Cervical back: Normal range of motion.  Skin:    General: Skin is warm and dry.  Neurological:      Mental Status: She is alert and oriented to person, place, and time.     Motor: Weakness present.  Psychiatric:        Mood and Affect: Mood normal.        Behavior: Behavior normal.    Data Reviewed:  There are no new results to review at this time.  Family Communication: Plan of care discussed with patient at the bedside.  She verbalizes understanding and agrees with the plan.  Disposition: Status is: Inpatient Remains inpatient appropriate because: Awaiting discharge to rehab  Planned Discharge Destination: Rehab    Time spent: 30 minutes  Author: Lucile Shutters, MD 10/15/2022 1:30 PM  For on call review [email or web address].

## 2022-10-15 NOTE — Progress Notes (Signed)
Physical Therapy Treatment Patient Details Name: Susan [name] MRN: [id number] DOB: 07-16-1960 Today's Date: 10/15/2022   History of Present Illness Susan [name] is a 62 y/o F with PMH: HTN, DM2, HLD, ETOH use, THA, PTSD, cocaine, bipolar, and L thalamic infarct (06/2022), s/p lumbar laminoforaminotomy (10/10/22). Presenting post L3-5 decompression with evacuation of epidural hematoma on 10/12/22 for Cauda Equina Syndrome.    PT Comments    Patient is in bed upon PT arrival with family member in room. Patient is agreeable to participate in PT as she has already been notified PT would arrive after lunch. Patient performed supine mobility to prepare for ambulation. First ambulation performed for 50 ft, patient returned to bed and rested EOB seated; second ambulation was 106 ft with RW and cues for upright position. She is returned to bed and is able to position in supine without assistance. Needs met and family in room upon completion of therapy session. Current POC remains appropriate at this time.     Recommendations for follow up therapy are one component of a multi-disciplinary discharge planning process, led by the attending physician.  Recommendations may be updated based on patient status, additional functional criteria and insurance authorization.  Follow Up Recommendations  Can patient physically be transported by private vehicle: Yes    Assistance Recommended at Discharge Set up Supervision/Assistance  Patient can return home with the following A little help with walking and/or transfers;A little help with bathing/dressing/bathroom;Assistance with cooking/housework;Assist for transportation;Help with stairs or ramp for entrance   Equipment Recommendations  None recommended by PT    Recommendations for Other Services       Precautions / Restrictions Precautions Precautions: Fall;Back Restrictions Weight Bearing Restrictions: No     Mobility  Bed Mobility Overal bed mobility:  Modified Independent             General bed mobility comments: increased time for performance    Transfers Overall transfer level: Needs assistance Equipment used: Rolling walker (2 wheels) Transfers: Sit to/from Stand Sit to Stand: From elevated surface, Min guard           General transfer comment: Uses RW, cues for hand placement    Ambulation/Gait Ambulation/Gait assistance: Min guard Gait Distance (Feet): 106 Feet Assistive device: Rolling walker (2 wheels) Gait Pattern/deviations: Wide base of support, Shuffle Gait velocity: decreased     General Gait Details: Performed two bouts of ambulation, first session with increased trunk flexion and heavy reliance upon UE's, second session with improved upright posture   Stairs             Wheelchair Mobility    Modified Rankin (Stroke Patients Only)       Balance Overall balance assessment: Needs assistance Sitting-balance support: Feet supported Sitting balance-Leahy Scale: Fair     Standing balance support: No upper extremity supported, During functional activity Standing balance-Leahy Scale: Fair                              Cognition Arousal/Alertness: Awake/alert Behavior During Therapy: WFL for tasks assessed/performed Overall Cognitive Status: Within Functional Limits for tasks assessed                                          Exercises General Exercises - Lower Extremity Ankle Circles/Pumps: Strengthening, Both, 10 reps, Supine Heel Slides: Strengthening, Both, 10  reps, Supine Other Exercises Other Exercises: Patient tolerates ambulation x 2 with seated rest break between    General Comments        Pertinent Vitals/Pain Pain Assessment Pain Assessment: 0-10 Pain Score: 4  Pain Location: low back Pain Descriptors / Indicators: Aching Pain Intervention(s): Monitored during session, Repositioned    Home Living                           Prior Function            PT Goals (current goals can now be found in the care plan section) Acute Rehab PT Goals Patient Stated Goal: to go home PT Goal Formulation: With patient Time For Goal Achievement: 10/27/22 Potential to Achieve Goals: Good Progress towards PT goals: Progressing toward goals    Frequency    7X/week      PT Plan Current plan remains appropriate    Co-evaluation              AM-PAC PT "6 Clicks" Mobility   Outcome Measure  Help needed turning from your back to your side while in a flat bed without using bedrails?: None Help needed moving from lying on your back to sitting on the side of a flat bed without using bedrails?: None Help needed moving to and from a bed to a chair (including a wheelchair)?: None Help needed standing up from a chair using your arms (e.g., wheelchair or bedside chair)?: A Little Help needed to walk in hospital room?: A Lot Help needed climbing 3-5 steps with a railing? : A Lot 6 Click Score: 19    End of Session Equipment Utilized During Treatment: Gait belt Activity Tolerance: Patient tolerated treatment well Patient left: in bed;with call bell/phone within reach;with bed alarm set;with family/visitor present Nurse Communication: Mobility status PT Visit Diagnosis: Other abnormalities of gait and mobility (R26.89);Muscle weakness (generalized) (M62.81);Pain     Time: 1257-1316 PT Time Calculation (min) (ACUTE ONLY): 19 min  Charges:  $Therapeutic Activity: 8-22 mins                     Precious Bard, PT, DPT Physical Therapist - [facility]  Outpatient Physical Therapy- [facility] [phone]    10/15/2022, 3:17 PM

## 2022-10-15 NOTE — Progress Notes (Signed)
Neurosurgery visit note Patient seen and examined.  Patient reports minimal back pain well-controlled on medication.  She is been up and ambulating along with physical therapy and the nursing teams.  She has noted she has not had a bowel movement in several days though continues to take the bowel regimen and is up and ambulating to try to improve this.  She has been eating and otherwise doing well.  Examination of back reveals well-healing incision no swelling redness or erythema no drainage.  Physical exam is notable for approximately 3 out of 5 strength in her right plantarflexion dorsiflexion and EHL otherwise grossly full strength in her lower extremities intact sensation.  AP: Overall the patient doing extremely well she continues to ambulate pain is well-controlled I will continue to ambulate and working with the physical therapy teams.  I suspect that she will be here through the weekend as we continue to mobilize her and continue bowel regimen  Peter Garter. Madaline Brilliant, MD Neurosurgery

## 2022-10-16 LAB — GLUCOSE, CAPILLARY
Glucose-Capillary: 100 mg/dL — ABNORMAL HIGH (ref 70–99)
Glucose-Capillary: 73 mg/dL (ref 70–99)
Glucose-Capillary: 172 mg/dL — ABNORMAL HIGH (ref 70–99)
Glucose-Capillary: 128 mg/dL — ABNORMAL HIGH (ref 70–99)

## 2022-10-16 LAB — BASIC METABOLIC PANEL
Anion gap: 10 (ref 5–15)
BUN: 17 mg/dL (ref 8–23)
CO2: 27 mmol/L (ref 22–32)
Calcium: 8.7 mg/dL — ABNORMAL LOW (ref 8.9–10.3)
Chloride: 99 mmol/L (ref 98–111)
Creatinine, Ser: 0.93 mg/dL (ref 0.44–1.00)
GFR, Estimated: >60 mL/min (ref >60)
Glucose, Bld: 104 mg/dL — ABNORMAL HIGH (ref 70–99)
Potassium: 4.3 mmol/L (ref 3.5–5.1)
Sodium: 136 mmol/L (ref 135–145)

## 2022-10-16 MED ORDER — LACTULOSE 10 GM/15ML PO SOLN: 30.0000 g | Freq: Once | ORAL | Status: DC

## 2022-10-16 NOTE — Progress Notes (Signed)
Pts BP 118/58, MD Agbata notified, orders received to hold 1700 Carvedilol 25 mg.

## 2022-10-16 NOTE — Progress Notes (Signed)
Progress Note   Patient: Susan [name] DOB: 1960/09/28 DOA: 10/12/2022     4 DOS: the patient was seen and examined on 10/16/2022   Brief hospital course: Susan [name] is a 62 y.o. female with past medical history of anemia, anxiety, bipolar, hypertension, hepatitis C, OSA, stroke, tobacco abuse and CVA with right-sided baseline deficits comes back for evaluation of worsening right leg weakness worsening.  Patient had lumbar spine surgery 1 day prior to admission and was having trouble with ambulation as well as incomplete emptying of the bladder with frequency.  Consult requested as patient was found to be hypoxic. Pt is status post L3  -5 decompression with evacuation of epidural hematoma  on 10/12/22 for Cauda Equina Syndrome. Medical consult requested for acute respiratory failure as patient was noted to be hypoxic and currently on 3 L nasal cannula with O2 sats in the high 90s.   Assessment and Plan:  Acute respiratory failure with hypoxia Probably secondary to atelectasis Patient has been weaned off oxygen and appears comfortable on room air Continue incentive spirometer, patient advised to use 10 times every hour while awake.       Cauda equina syndrome Epidural fluid collection Status post right L4/5 laminoforaminotomy which was done on 10/10/22 and her intraoperative course was complicated by CSF leak that was repaired intraoperatively. Patient returned to the ER on 10/11/22 for evaluation of multiple falls at home, right leg weakness/numbness, difficulty ambulating and episodes of urinary incontinence/retention. Had a bladder scan in the ER which showed about 650 cc of urine and a Foley catheter was placed Had an MRI of the lumbar spine which showed at the surgical level and extending from the upper L3 level to the L4-5 disc level, there is a postoperative fluid collection within the spinal canal which causes marked compression of the thecal sac anteriorly. This  is most severe at the level of the L3-4 disc where the fluid collection within the canal measures 1.8 x 1.5 cm in diameter and markedly flattens the thecal sac anteriorly. She was taken emergently to surgery and is status post L3 -5 lumbar decompression with evacuation of epidural hematoma. (POD 4) Further management per neurosurgery Awaiting discharge to skilled nursing facility for subacute rehab     COPD Nicotine dependence Not acutely exacerbated Smoking cessation has been discussed with patient in detail Continue nicotine transdermal patch Continue as needed bronchodilator therapy       Obesity BMI 38.59 Complicates overall prognosis and care Lifestyle modification and exercise has been discussed with patient     Hypertension Continue carvedilol, spironolactone Avapro and amlodipine       Diabetes mellitus, type II Continue Ozempic 1 mg weekly     Fall precautions        Subjective: Patient is seen and examined at the bedside.  Sitting up in a chair.  Has not had a bowel movement since surgery.  Physical Exam: Vitals:   10/15/22 1511 10/15/22 1549 10/15/22 2243 10/16/22 0749  BP:  130/70 134/74 106/67  Pulse:  70 71 66  Resp:  18 16 16   Temp:  98.3 F (36.8 C) 98.4 F (36.9 C) 97.9 F (36.6 C)  TempSrc:    Oral  SpO2: 98% 99% 94% 99%  Weight:      Height:      Constitutional:      Appearance: She is obese.  HENT:     Head: Normocephalic.     Nose: Nose normal.  Mouth/Throat:     Mouth: Mucous membranes are moist.  Eyes:     Conjunctiva/sclera: Conjunctivae normal.  Cardiovascular:     Rate and Rhythm: Normal rate and regular rhythm.  Pulmonary:     Effort: Pulmonary effort is normal.     Breath sounds: Normal breath sounds.  Abdominal:     General: Bowel sounds are normal.     Palpations: Abdomen is soft.     Comments: Central adiposity  Musculoskeletal:        General: Normal range of motion.     Cervical back: Normal range of motion.   Skin:    General: Skin is warm and dry.  Neurological:     Mental Status: She is alert and oriented to person, place, and time.     Motor: Weakness present.  Psychiatric:        Mood and Affect: Mood normal.        Behavior: Behavior normal.     Data Reviewed:  There are no new results to review at this time.  Family Communication: Greater than 50% of time was spent discussing plan of care with patient at the bedside.  She verbalizes understanding and agrees to the plan.  Disposition: Status is: Inpatient Remains inpatient appropriate because: Awaiting placement.  Planned Discharge Destination: Rehab    Time spent: 30 minutes  Author: Lucile Shutters, MD 10/16/2022 11:15 AM  For on call review [email or web address].

## 2022-10-16 NOTE — Progress Notes (Signed)
Neurosurgery Visit Note  Patient seen and examined.  Patient's pain well-controlled she has been ambulating mobilizing well.  She is actually started having bowel movements in addition.  She continues to feel slightly constipated but is taking her bowel regimen.  Otherwise she is doing well.  No back pain  Lower extremity exam is stable with regards to the right lower extremity weakness and otherwise grossly neurologically intact.  Incision clean dry and intact  AP: Overall the patient is doing extremely well she will continue to mobilize, and is awaiting dispo to rehab  Peter Garter. Madaline Brilliant, MD Neurosurgery

## 2022-10-16 NOTE — Progress Notes (Signed)
Physical Therapy Treatment Patient Details Name: Susan [name] MRN: [id number] DOB: 1961/01/26 Today's Date: 10/16/2022   History of Present Illness Susan [name] is a 62 y/o F with PMH: HTN, DM2, HLD, ETOH use, THA, PTSD, cocaine, bipolar, and L thalamic infarct (06/2022), s/p lumbar laminoforaminotomy (10/10/22). Presenting post L3-5 decompression with evacuation of epidural hematoma on 10/12/22 for Cauda Equina Syndrome.    PT Comments    Pt in bed, ready to get up.  She is asked to stay supine while room is readied but she progresses to EOB with rails on her own.  Steady in sitting with arm support.  She is able to stand and walk 40' with RW and contact guard/progressing to min a x 1 due to fatigue.  She takes 2 self initiated rests in standing and when asked stated she will make it around nursing pod but then asks for chair to sit.  Demonstrates lack of awareness in her abilities today.  She takes a short rest in chair before walking back to room with another seated rest in hallway at 20' before completing last 20' to room.  Does stop at [facility] to void and remains up in recliner after session.  She does have some increased difficulty walking today with decreased quality.  Reports no increase in pain.  Does state she feels some weakness R ankle and has asked her son to bring in her shoes as she feels that will improve her gait.  She is in agreement to SNF and a this point she would benefit from continued therapies at discharge.    Recommendations for follow up therapy are one component of a multi-disciplinary discharge planning process, led by the attending physician.  Recommendations may be updated based on patient status, additional functional criteria and insurance authorization.  Follow Up Recommendations       Assistance Recommended at Discharge Set up Supervision/Assistance  Patient can return home with the following A little help with walking and/or transfers;A little help with  bathing/dressing/bathroom;Assistance with cooking/housework;Assist for transportation;Help with stairs or ramp for entrance   Equipment Recommendations  None recommended by PT    Recommendations for Other Services       Precautions / Restrictions Precautions Precautions: Fall;Back Restrictions Weight Bearing Restrictions: No     Mobility  Bed Mobility Overal bed mobility: Needs Assistance Bed Mobility: Supine to Sit     Supine to sit: Supervision     General bed mobility comments: increased time for performance Patient Response: Cooperative, Impulsive  Transfers Overall transfer level: Needs assistance Equipment used: Rolling walker (2 wheels) Transfers: Sit to/from Stand Sit to Stand: Min guard, From elevated surface                Ambulation/Gait Ambulation/Gait assistance: Min guard, Min assist Gait Distance (Feet): 40 Feet Assistive device: Rolling walker (2 wheels) Gait Pattern/deviations: Step-through pattern, Decreased step length - right, Decreased step length - left Gait velocity: decreased     General Gait Details: 40' x 1 20' x 2 limited by increased weakness today   Stairs             Wheelchair Mobility    Modified Rankin (Stroke Patients Only)       Balance Overall balance assessment: Needs assistance Sitting-balance support: Feet supported Sitting balance-Leahy Scale: Fair     Standing balance support: Bilateral upper extremity supported Standing balance-Leahy Scale: Poor Standing balance comment: +1 hands on assist today due to unsteadiness  Cognition Arousal/Alertness: Awake/alert Behavior During Therapy: WFL for tasks assessed/performed Overall Cognitive Status: Within Functional Limits for tasks assessed                                 General Comments: asked to remain supine until room set up but she progresses to EOB despite request        Exercises Other  Exercises Other Exercises: to [facility] to void    General Comments        Pertinent Vitals/Pain Pain Assessment Pain Assessment: Faces Faces Pain Scale: Hurts a little bit Pain Location: low back Pain Descriptors / Indicators: Sore Pain Intervention(s): Monitored during session, Repositioned    Home Living                          Prior Function            PT Goals (current goals can now be found in the care plan section) Progress towards PT goals: Progressing toward goals    Frequency    7X/week      PT Plan Current plan remains appropriate    Co-evaluation              AM-PAC PT "6 Clicks" Mobility   Outcome Measure  Help needed turning from your back to your side while in a flat bed without using bedrails?: None Help needed moving from lying on your back to sitting on the side of a flat bed without using bedrails?: A Little Help needed moving to and from a bed to a chair (including a wheelchair)?: A Little Help needed standing up from a chair using your arms (e.g., wheelchair or bedside chair)?: A Little Help needed to walk in hospital room?: A Lot Help needed climbing 3-5 steps with a railing? : A Lot 6 Click Score: 17    End of Session Equipment Utilized During Treatment: Gait belt Activity Tolerance: Patient limited by fatigue Patient left: in chair;with call bell/phone within reach;with chair alarm set Nurse Communication: Mobility status PT Visit Diagnosis: Other abnormalities of gait and mobility (R26.89);Muscle weakness (generalized) (M62.81);Pain     Time: 0826-0850 PT Time Calculation (min) (ACUTE ONLY): 24 min  Charges:  $Gait Training: 23-37 mins                   Danielle Dess, PTA 10/16/22, 9:38 AM

## 2022-10-16 NOTE — Progress Notes (Signed)
Pts BP 106/67, MD notified, per order morning BP meds held.

## 2022-10-17 ENCOUNTER — Encounter: Payer: Self-pay | Admitting: Neurosurgery

## 2022-10-17 ENCOUNTER — Inpatient Hospital Stay: Payer: 59

## 2022-10-17 DIAGNOSIS — J9601 Acute respiratory failure with hypoxia: Secondary | ICD-10-CM | POA: Diagnosis not present

## 2022-10-17 LAB — GLUCOSE, CAPILLARY
Glucose-Capillary: 102 mg/dL — ABNORMAL HIGH (ref 70–99)
Glucose-Capillary: 94 mg/dL (ref 70–99)
Glucose-Capillary: 119 mg/dL — ABNORMAL HIGH (ref 70–99)
Glucose-Capillary: 143 mg/dL — ABNORMAL HIGH (ref 70–99)

## 2022-10-17 MED ORDER — CARVEDILOL 12.5 MG PO TABS
12.5000 mg | ORAL_TABLET | Freq: Two times a day (BID) | ORAL | Status: DC
  Administered 2022-10-17 – 2022-10-19 (×3): 12.5 mg via ORAL
  Filled 2022-10-17 (×3): qty 1

## 2022-10-17 MED ORDER — IOHEXOL 350 MG/ML SOLN
75.0000 mL | Freq: Once | INTRAVENOUS | Status: AC | PRN
  Administered 2022-10-17: 75 mL via INTRAVENOUS

## 2022-10-17 NOTE — Progress Notes (Addendum)
Progress Note   Patient: Susan [name] WGN:[id number] DOB: 1960-11-12 DOA: 10/12/2022     5 DOS: the patient was seen and examined on 10/17/2022   Brief hospital course: Susan [name] is a 62 y.o. female with past medical history of anemia, anxiety, bipolar, hypertension, hepatitis C, OSA, stroke, tobacco abuse and CVA with right-sided baseline deficits comes back for evaluation of worsening right leg weakness worsening.  Patient had lumbar spine surgery 1 day prior to admission and was having trouble with ambulation as well as incomplete emptying of the bladder with frequency.  Consult requested as patient was found to be hypoxic. Pt is status post L3  -5 decompression with evacuation of epidural hematoma  on 10/12/22 for Cauda Equina Syndrome. Medical consult requested for acute respiratory failure as patient was noted to be hypoxic and currently on 3 L nasal cannula with O2 sats in the high 90s.    10/17/22 : Had to be placed back on oxygen due to hypoxia on room air pulse oximetry in the 70s.  Denies having any shortness of breath but states that she has been anxious.  Currently on 2 L of oxygen with pulse oximetry greater than 92%      Assessment and Plan:  Acute respiratory failure with hypoxia Probably secondary to atelectasis Patient was initially weaned off oxygen but required oxygen supplementation overnight for pulse oximetry in the 70s. Will obtain a CT angiogram to rule out PE   Cauda equina syndrome Epidural fluid collection Status post right L4/5 laminoforaminotomy which was done on 10/10/22 and her intraoperative course was complicated by CSF leak that was repaired intraoperatively. Patient returned to the ER on 10/11/22 for evaluation of multiple falls at home, right leg weakness/numbness, difficulty ambulating and episodes of urinary incontinence/retention. Had a bladder scan in the ER which showed about 650 cc of urine and a Foley catheter was placed but has since  been discontinued and patient able to void Had an MRI of the lumbar spine which showed at the surgical level and extending from the upper L3 level to the L4-5 disc level, there is a postoperative fluid collection within the spinal canal which causes marked compression of the thecal sac anteriorly. This is most severe at the level of the L3-4 disc where the fluid collection within the canal measures 1.8 x 1.5 cm in diameter and markedly flattens the thecal sac anteriorly. She was taken emergently to surgery and is status post L3 -5 lumbar decompression with evacuation of epidural hematoma. (POD 5) Further management per neurosurgery Awaiting discharge to skilled nursing facility for subacute rehab     COPD Nicotine dependence Not acutely exacerbated Smoking cessation has been discussed with patient in detail Continue nicotine transdermal patch Continue as needed bronchodilator therapy       Obesity BMI 38.59 Complicates overall prognosis and care Lifestyle modification and exercise has been discussed with patient     Hypertension Continue carvedilol, spironolactone and Avapro  Decrease dose of carvedilol due to relative hypotension and bradycardia Hold amlodipine       Diabetes mellitus, type II Continue Ozempic 1 mg weekly     Fall precautions          Subjective: Patient is seen and examined at bedside. Appears comfortable on 2 L  Physical Exam: Vitals:   10/16/22 0749 10/16/22 1805 10/17/22 0022 10/17/22 0713  BP: 106/67 (!) 118/58 (!) 116/49 (!) 101/48  Pulse: 66 63 64 (!) 55  Resp: 16 18 18  16  Temp: 97.9 F (36.6 C) 97.8 F (36.6 C) 98 F (36.7 C) 98.1 F (36.7 C)  TempSrc: Oral     SpO2: 99% 96% 96% 100%  Weight:      Height:        Constitutional:      Appearance: She is obese.  HENT:     Head: Normocephalic.     Nose: Nose normal.     Mouth/Throat:     Mouth: Mucous membranes are moist.  Eyes:     Conjunctiva/sclera: Conjunctivae normal.   Cardiovascular:     Rate and Rhythm: Normal rate and regular rhythm.  Pulmonary:     Effort: Pulmonary effort is normal.     Breath sounds: Normal breath sounds.  Abdominal:     General: Bowel sounds are normal.     Palpations: Abdomen is soft.     Comments: Central adiposity  Musculoskeletal:        General: Normal range of motion.     Cervical back: Normal range of motion.  Skin:    General: Skin is warm and dry.  Neurological:     Mental Status: She is alert and oriented to person, place, and time.     Motor: Weakness present.  Psychiatric:        Mood and Affect: Mood normal.        Behavior: Behavior normal.    Data Reviewed: Labs reviewed There are no new results to review at this time.  Family Communication: Plan of care discussed with patient at the bedside  Disposition: Status is: Inpatient Remains inpatient appropriate because: Awaiting discharge to rehab  Planned Discharge Destination: Skilled nursing facility    Time spent: 35 minutes  Author: Lucile Shutters, MD 10/17/2022 11:20 AM  For on call review [email or web address].

## 2022-10-17 NOTE — Progress Notes (Signed)
Physical Therapy Treatment Patient Details Name: Susan [name] MRN: [id number] DOB: Mar 24, 1961 Today's Date: 10/17/2022   History of Present Illness Susan [name] is a 62 y/o F with PMH: HTN, DM2, HLD, ETOH use, THA, PTSD, cocaine, bipolar, and L thalamic infarct (06/2022), s/p lumbar laminoforaminotomy (10/10/22). Presenting post L3-5 decompression with evacuation of epidural hematoma on 10/12/22 for Cauda Equina Syndrome.    PT Comments    CT results reviewed.  Pt in bed, initially declined therapy as he is not feeling well but then stated she needed to use the bathroom.  She is able to get to EOB with supervision.  Steady in sitting.  Stands to RW and is able to walk to/from bathroom to void with contact guard/occasional min assist for general imbalances and walker guidance.  Returns to bed and is positioned to comfort.   Of note, pt had removed O2 and was laying on cannula.  Upon walking to bathroom on room air and back to bed, sats 92%.  O2 was left off and discussed with RN to manage as appropriate.  She does state she feels a bit "off" today  Recommendations for follow up therapy are one component of a multi-disciplinary discharge planning process, led by the attending physician.  Recommendations may be updated based on patient status, additional functional criteria and insurance authorization.  Follow Up Recommendations  Can patient physically be transported by private vehicle: Yes    Assistance Recommended at Discharge Set up Supervision/Assistance  Patient can return home with the following A little help with walking and/or transfers;A little help with bathing/dressing/bathroom;Assistance with cooking/housework;Assist for transportation;Help with stairs or ramp for entrance   Equipment Recommendations  None recommended by PT    Recommendations for Other Services       Precautions / Restrictions Precautions Precautions: Fall;Back Restrictions Weight Bearing Restrictions: No      Mobility  Bed Mobility Overal bed mobility: Needs Assistance Bed Mobility: Supine to Sit, Sit to Supine     Supine to sit: Supervision Sit to supine: Supervision     Patient Response: Cooperative  Transfers Overall transfer level: Needs assistance Equipment used: Rolling walker (2 wheels) Transfers: Sit to/from Stand Sit to Stand: Min guard                Ambulation/Gait Ambulation/Gait assistance: Min guard, Min assist Gait Distance (Feet): 12 Feet Assistive device: Rolling walker (2 wheels) Gait Pattern/deviations: Step-through pattern, Decreased step length - right, Decreased step length - left Gait velocity: decreased     General Gait Details: 12' x 2 to and from bathroom.   Stairs             Wheelchair Mobility    Modified Rankin (Stroke Patients Only)       Balance Overall balance assessment: Needs assistance Sitting-balance support: Feet supported Sitting balance-Leahy Scale: Fair     Standing balance support: Bilateral upper extremity supported Standing balance-Leahy Scale: Poor                              Cognition Arousal/Alertness: Awake/alert Behavior During Therapy: WFL for tasks assessed/performed Overall Cognitive Status: Within Functional Limits for tasks assessed                                          Exercises Other Exercises Other Exercises: to bathroom to void  General Comments        Pertinent Vitals/Pain Pain Assessment Pain Assessment: Faces Faces Pain Scale: Hurts a little bit Pain Location: low back Pain Descriptors / Indicators: Sore Pain Intervention(s): Limited activity within patient's tolerance    Home Living                          Prior Function            PT Goals (current goals can now be found in the care plan section) Progress towards PT goals: Progressing toward goals    Frequency    7X/week      PT Plan Current plan remains  appropriate    Co-evaluation              AM-PAC PT "6 Clicks" Mobility   Outcome Measure  Help needed turning from your back to your side while in a flat bed without using bedrails?: None Help needed moving from lying on your back to sitting on the side of a flat bed without using bedrails?: A Little Help needed moving to and from a bed to a chair (including a wheelchair)?: A Little Help needed standing up from a chair using your arms (e.g., wheelchair or bedside chair)?: A Little Help needed to walk in hospital room?: A Little Help needed climbing 3-5 steps with a railing? : A Lot 6 Click Score: 18    End of Session Equipment Utilized During Treatment: Gait belt Activity Tolerance: Patient limited by fatigue Patient left: in bed;with call bell/phone within reach;with bed alarm set Nurse Communication: Mobility status PT Visit Diagnosis: Other abnormalities of gait and mobility (R26.89);Muscle weakness (generalized) (M62.81);Pain     Time: 1521-1530 PT Time Calculation (min) (ACUTE ONLY): 9 min  Charges:  $Gait Training: 8-22 mins                   Danielle Dess, PTA 10/17/22, 3:45 PM '

## 2022-10-17 NOTE — Progress Notes (Signed)
   Neurosurgery Progress Note  History: Susan [name] is s/p L3-5 decompression with epidural hematoma evacuation for cauda equina.   POD5: Pt hypoxic overnight into the 70's. O2 restarted.  POD2: NAEO. Pt reporting improvement numbness and strength in RLE POD1: Patient reports improvement of her leg pain as well as some improvement in her lower extremity weakness.  Her immediate postop recovery was complicated by acute respiratory failure with hypoxia.  Internal medicine was consulted for further evaluation and treatment  Physical Exam: Vitals:   10/17/22 0022 10/17/22 0713  BP: (!) 116/49 (!) 101/48  Pulse: 64 (!) 55  Resp: 18 16  Temp: 98 F (36.7 C) 98.1 F (36.7 C)  SpO2: 96% 100%    AA Ox3 CNI  Strength:ambulating to the bathroom with rolling walker Incision: c/d/I with sutures in place. Open to air   Data:  Other tests/results: Not applicable to her recent surgery.  Please see hospitalist consult note for further details  Assessment/Plan:  Susan [name] is a 62 year old female who underwent a right L4-5 laminoforaminotomy for synovial cyst resection on 10/10/2022.  This was complicated by CSF leak which was repaired intraoperatively.  Patient represented to the ER on postop day 2 with symptoms concerning for cauda equina.  She underwent a L3-5 decompression and evacuation of epidural hematoma on 10/12/2022.  She experienced hypoxia postoperatively and internal medicine was consulted for assistance.  - mobilize - pain control - DVT prophylaxis - HV removed 10/14/22 - OK to leave incision open to air. - PTOT; awaiting placement for SNF - Hypoxia: Medicine planning for CT angio this morning  -We appreciate internal medicine's assistance with additional medical concerns  Manning Charity PA-C Department of Neurosurgery

## 2022-10-18 DIAGNOSIS — J9601 Acute respiratory failure with hypoxia: Secondary | ICD-10-CM | POA: Diagnosis not present

## 2022-10-18 LAB — GLUCOSE, CAPILLARY
Glucose-Capillary: 99 mg/dL (ref 70–99)
Glucose-Capillary: 87 mg/dL (ref 70–99)
Glucose-Capillary: 105 mg/dL — ABNORMAL HIGH (ref 70–99)
Glucose-Capillary: 143 mg/dL — ABNORMAL HIGH (ref 70–99)

## 2022-10-18 MED ORDER — ENOXAPARIN SODIUM 40 MG/0.4ML IJ SOSY
40.0000 mg | PREFILLED_SYRINGE | INTRAMUSCULAR | Status: DC
  Administered 2022-10-18: 40 mg via SUBCUTANEOUS
  Filled 2022-10-18: qty 0.4

## 2022-10-18 NOTE — Progress Notes (Addendum)
Physical Therapy Treatment Patient Details Name: Susan [name] MRN: [id number] DOB: 01-28-1961 Today's Date: 10/18/2022   History of Present Illness Lynn Malpass is a 62 y/o F with PMH: HTN, DM2, HLD, ETOH use, THA, PTSD, cocaine, bipolar, and L thalamic infarct (06/2022), s/p lumbar laminoforaminotomy (10/10/22). Presenting post L3-5 decompression with evacuation of epidural hematoma on 10/12/22 for Cauda Equina Syndrome.    PT Comments    Pt in bed, ready for session.  Room air at rest 94-96%.  She is able to transition to EOB with supervision.  Stands pulling up on walker and education is provided for proper hand placements.  She is able to complete x 1 lap around nursing pod with RW and contact guard/min assist.  Gait remains generally unsteady with overall weakness noted but she does state she has more feeling in her legs today.  She returns to room with phone ringing.  She is asked to walk around bed and not answer her phone.  She stops at side of bed and reaches over rails using them for support to get her phone and talks for a minute or so before continuing back around bed.  She opts to sit in chair.  After short rest, she is able to stand for some standing ex with walker support before fatigue.  Cues are needed for hand placements.  Sats remains at baseline on room air 94-96% with no SOB noted today.  She is reminded to call for assist.  "I can't even pivot by myslef?".  She agrees to use bell and chair alarm is on with call bell in reach upon exit.  Discussed discharge plan.  She does state she wants to go home but is willing and knows she needs rehab to be safe.  Discussed with team.  She will benefit from further PT/OT interventions at discharge as risk for fall remains elevated due to weakness, safety and lack of insight of deficits.   Recommendations for follow up therapy are one component of a multi-disciplinary discharge planning process, led by the attending physician.  Recommendations  may be updated based on patient status, additional functional criteria and insurance authorization.  Follow Up Recommendations       Assistance Recommended at Discharge Intermittent Supervision/Assistance  Patient can return home with the following A little help with walking and/or transfers;A little help with bathing/dressing/bathroom;Assistance with cooking/housework;Assist for transportation;Help with stairs or ramp for entrance   Equipment Recommendations  None recommended by PT    Recommendations for Other Services       Precautions / Restrictions Precautions Precautions: Fall;Back Restrictions Weight Bearing Restrictions: No     Mobility  Bed Mobility Overal bed mobility: Needs Assistance Bed Mobility: Supine to Sit     Supine to sit: Supervision       Patient Response: Cooperative  Transfers Overall transfer level: Needs assistance Equipment used: Rolling walker (2 wheels) Transfers: Sit to/from Stand Sit to Stand: Min guard           General transfer comment: Uses RW, cues for hand placement    Ambulation/Gait Ambulation/Gait assistance: Min guard, Min assist Gait Distance (Feet): 120 Feet Assistive device: Rolling walker (2 wheels) Gait Pattern/deviations: Step-through pattern, Decreased step length - right, Decreased step length - left Gait velocity: decreased     General Gait Details: completes lap around pod.  remains generally unsteady but overall improved this session.  reports more feeling in her leg today   Stairs  Wheelchair Mobility    Modified Rankin (Stroke Patients Only)       Balance Overall balance assessment: Needs assistance Sitting-balance support: Feet supported Sitting balance-Leahy Scale: Fair     Standing balance support: Bilateral upper extremity supported Standing balance-Leahy Scale: Fair Standing balance comment: +1 hands on assist today due to unsteadiness and poor safety                             Cognition Arousal/Alertness: Awake/alert Behavior During Therapy: WFL for tasks assessed/performed Overall Cognitive Status: Within Functional Limits for tasks assessed                                 General Comments: continues with to demonstrate poor safety and insight into deficits        Exercises Other Exercises Other Exercises: standing marches and SLR, unable to heel raise    General Comments        Pertinent Vitals/Pain Pain Assessment Pain Assessment: No/denies pain    Home Living                          Prior Function            PT Goals (current goals can now be found in the care plan section) Progress towards PT goals: Progressing toward goals    Frequency    7X/week      PT Plan Current plan remains appropriate    Co-evaluation              AM-PAC PT "6 Clicks" Mobility   Outcome Measure  Help needed turning from your back to your side while in a flat bed without using bedrails?: None Help needed moving from lying on your back to sitting on the side of a flat bed without using bedrails?: None Help needed moving to and from a bed to a chair (including a wheelchair)?: A Little Help needed standing up from a chair using your arms (e.g., wheelchair or bedside chair)?: A Little Help needed to walk in hospital room?: A Little Help needed climbing 3-5 steps with a railing? : A Lot 6 Click Score: 19    End of Session Equipment Utilized During Treatment: Gait belt Activity Tolerance: Patient tolerated treatment well Patient left: in chair;with call bell/phone within reach;with chair alarm set Nurse Communication: Mobility status PT Visit Diagnosis: Other abnormalities of gait and mobility (R26.89);Muscle weakness (generalized) (M62.81);Pain     Time: 1610-9604 PT Time Calculation (min) (ACUTE ONLY): 13 min  Charges:  $Gait Training: 8-22 mins                    Danielle Dess, PTA 10/18/22,  9:20 AM

## 2022-10-18 NOTE — Progress Notes (Signed)
Progress Note   Patient: Susan [name] ZOX:[id number] DOB: February 28, 1961 DOA: 10/12/2022     6 DOS: the patient was seen and examined on 10/18/2022   Brief hospital course:  Susan [name] is a 62 y.o. female with past medical history of anemia, anxiety, bipolar, hypertension, hepatitis C, OSA, stroke, tobacco abuse and CVA with right-sided baseline deficits comes back for evaluation of worsening right leg weakness worsening.  Patient had lumbar spine surgery 1 day prior to admission and was having trouble with ambulation as well as incomplete emptying of the bladder with frequency.  Consult requested as patient was found to be hypoxic. Pt is status post L3  -5 decompression with evacuation of epidural hematoma  on 10/12/22 for Cauda Equina Syndrome. Medical consult requested for acute respiratory failure as patient was noted to be hypoxic and currently on 3 L nasal cannula with O2 sats in the high 90s.     10/17/22 : Had to be placed back on oxygen due to hypoxia on room air pulse oximetry in the 70s.  Denies having any shortness of breath but states that she has been anxious.  Currently on 2 L of oxygen with pulse oximetry greater than 92%   10/18/22: Off oxygen and doing well     Assessment and Plan:  Acute respiratory failure with hypoxia Probably secondary to atelectasis Patient was initially weaned off oxygen but required oxygen supplementation overnight for pulse oximetry in the 70s. CT angiogram was done for further evaluation of hypoxia and it showed no pulmonary emboli or other acute abnormality. Cardiomegaly. Right nephrolithiasis.     Cauda equina syndrome Epidural fluid collection Status post right L4/5 laminoforaminotomy which was done on 10/10/22 and her intraoperative course was complicated by CSF leak that was repaired intraoperatively. Patient returned to the ER on 10/11/22 for evaluation of multiple falls at home, right leg weakness/numbness, difficulty ambulating and  episodes of urinary incontinence/retention. Had a bladder scan in the ER which showed about 650 cc of urine and a Foley catheter was placed but has since been discontinued and patient able to void Had an MRI of the lumbar spine which showed at the surgical level and extending from the upper L3 level to the L4-5 disc level, there is a postoperative fluid collection within the spinal canal which causes marked compression of the thecal sac anteriorly. This is most severe at the level of the L3-4 disc where the fluid collection within the canal measures 1.8 x 1.5 cm in diameter and markedly flattens the thecal sac anteriorly. She was taken emergently to surgery and is status post L3 -5 lumbar decompression with evacuation of epidural hematoma. (POD 6) Further management per neurosurgery Awaiting discharge to skilled nursing facility for subacute rehab     COPD Nicotine dependence Not acutely exacerbated Smoking cessation has been discussed with patient in detail Continue nicotine transdermal patch Continue as needed bronchodilator therapy       Obesity Obstructive sleep apnea BMI 38.59 Complicates overall prognosis and care Lifestyle modification and exercise has been discussed with patient Continue CPAP at bedtime     Hypertension Continue carvedilol, spironolactone and Avapro  Decrease dose of carvedilol due to relative hypotension and bradycardia Hold amlodipine       Diabetes mellitus, type II Continue Ozempic 1 mg weekly     Fall precautions            Subjective: Patient is seen and examined at the bedside.  No new complaints  Physical Exam: Vitals:  10/17/22 1648 10/17/22 2350 10/18/22 0756 10/18/22 0911  BP: 135/62 (!) 111/58 (!) 136/56   Pulse: 70 75 65   Resp: 16 18 18    Temp: 98.6 F (37 C) 98.1 F (36.7 C) 98.7 F (37.1 C)   TempSrc:      SpO2: 98% 96% 95% 95%  Weight:      Height:       Constitutional:      Appearance: She is obese.  HENT:      Head: Normocephalic.     Nose: Nose normal.     Mouth/Throat:     Mouth: Mucous membranes are moist.  Eyes:     Conjunctiva/sclera: Conjunctivae normal.  Cardiovascular:     Rate and Rhythm: Normal rate and regular rhythm.  Pulmonary:     Effort: Pulmonary effort is normal.     Breath sounds: Normal breath sounds.  Abdominal:     General: Bowel sounds are normal.     Palpations: Abdomen is soft.     Comments: Central adiposity  Musculoskeletal:        General: Normal range of motion.     Cervical back: Normal range of motion.  Skin:    General: Skin is warm and dry.  Neurological:     Mental Status: She is alert and oriented to person, place, and time.     Motor: Weakness present.  Psychiatric:        Mood and Affect: Mood normal.        Behavior: Behavior normal.  Data Reviewed:  There are no new results to review at this time.  Family Communication: Greater than 50% of time was spent discussing patient's condition and plan of care with her at the bedside.  All questions and concerns have been addressed.  Disposition: Status is: Inpatient Remains inpatient appropriate because: Awaiting discharge to SNF  Planned Discharge Destination: Skilled nursing facility    Time spent: 30 minutes  Author: Lucile Shutters, MD 10/18/2022 1:16 PM  For on call review [email or web address].

## 2022-10-18 NOTE — TOC Progression Note (Signed)
Transition of Care [facility]) - Progression Note    Patient Details  Name: Susan [name] MRN: [id number] Date of Birth: July 29, 1960  Transition of Care [facility]) CM/SW Contact  Garret Reddish, RN Phone Number: 10/18/2022, 1:25 PM  Clinical Narrative:   I have spoken with Mrs. Calzada today about short term rehab placement.  Mrs. Ziehm would like to go to [facility], [facility] or [facility].  [facility] and [facility] is unable to accept at this time. I have reached out to [facility] to see if they will review SNF referral for possible bed offer.  TOC will continue to follow progress of patient.      Expected Discharge Plan: Skilled Nursing Facility Barriers to Discharge: Continued Medical Work up  Expected Discharge Plan and Services     Post Acute Care Choice: Skilled Nursing Facility Living arrangements for the past 2 months: Single Family Home                                       Social Determinants of Health (SDOH) Interventions SDOH Screenings   Food Insecurity: No Food Insecurity (10/12/2022)  Housing: Patient Declined (10/12/2022)  Transportation Needs: No Transportation Needs (10/12/2022)  Utilities: Not At Risk (10/12/2022)  Alcohol Screen: Low Risk  (06/02/2021)  Depression (PHQ2-9): Low Risk  (09/28/2022)  Recent Concern: Depression (PHQ2-9) - High Risk (08/31/2022)  Financial Resource Strain: Low Risk  (06/02/2021)  Physical Activity: Sufficiently Active (06/02/2021)  Social Connections: Moderately Isolated (06/02/2021)  Stress: No Stress Concern Present (06/02/2021)  Tobacco Use: High Risk (10/17/2022)    Readmission Risk Interventions    10/13/2022    4:20 PM  Readmission Risk Prevention Plan  Transportation Screening Complete  PCP or Specialist Appt within 5-7 Days Complete  Medication Review (RN CM) Complete

## 2022-10-18 NOTE — Progress Notes (Signed)
   Neurosurgery Progress Note  History: Susan [name] is s/p L3-5 decompression with epidural hematoma evacuation for cauda equina.   POD6: NAEO POD5: Pt hypoxic overnight into the 70's. O2 restarted.  POD2: NAEO. Pt reporting improvement numbness and strength in RLE POD1: Patient reports improvement of her leg pain as well as some improvement in her lower extremity weakness.  Her immediate postop recovery was complicated by acute respiratory failure with hypoxia.  Internal medicine was consulted for further evaluation and treatment  Physical Exam: Vitals:   10/17/22 2350 10/18/22 0756  BP: (!) 111/58 (!) 136/56  Pulse: 75 65  Resp: 18 18  Temp: 98.1 F (36.7 C) 98.7 F (37.1 C)  SpO2: 96% 95%    AA Ox3 CNI  Strength:5/5 throughout LLE. RLE 5/5 HF, KE. 3/5 DF 2/5 EHL and PF  Incision: c/d/I with sutures in place. Open to air   Data:  Other tests/results: Not applicable to her recent surgery.  Please see hospitalist consult note for further details  Assessment/Plan:  Susan [name] is a 62 year old female who underwent a right L4-5 laminoforaminotomy for synovial cyst resection on 10/10/2022.  This was complicated by CSF leak which was repaired intraoperatively.  Patient represented to the ER on postop day 2 with symptoms concerning for cauda equina.  She underwent a L3-5 decompression and evacuation of epidural hematoma on 10/12/2022.  She experienced hypoxia postoperatively and internal medicine was consulted for assistance.  - mobilize - pain control - DVT prophylaxis - HV removed 10/14/22 - OK to leave incision open to air. - PTOT; awaiting placement for SNF. I have reached out to social work this morning for an update. - Hypoxia: CT angio on 10/17/2022 was negative.  Internal medicine recommending a walk test today.  Recommended that she continue with CPAP. -We appreciate internal medicine's assistance with additional medical concerns  Manning Charity PA-C Department of  Neurosurgery

## 2022-10-19 ENCOUNTER — Other Ambulatory Visit: Payer: Self-pay | Admitting: Neurosurgery

## 2022-10-19 LAB — GLUCOSE, CAPILLARY
Glucose-Capillary: 108 mg/dL — ABNORMAL HIGH (ref 70–99)
Glucose-Capillary: 115 mg/dL — ABNORMAL HIGH (ref 70–99)

## 2022-10-19 MED ORDER — ORAL CARE MOUTH RINSE: 15.0000 mL | OROMUCOSAL | Status: DC | PRN

## 2022-10-19 MED ORDER — CARVEDILOL 12.5 MG PO TABS: 12.5000 mg | ORAL_TABLET | Freq: Two times a day (BID) | ORAL | 0 refills | Status: DC

## 2022-10-19 NOTE — Discharge Summary (Addendum)
Discharge Summary  Patient ID: Susan [name] MRN: [id number] DOB/AGE: Dec 05, 1960 62 y.o.  Admit date: 10/12/2022 Discharge date: 10/19/2022  Admission Diagnoses: Cauda equina syndrome  Discharge Diagnoses:  Principal Problem:   Acute respiratory failure with hypoxia (HCC) Active Problems:   Hypertension   Tobacco use disorder   Centrilobular emphysema (HCC)   Cauda equina syndrome (HCC)   Epidural hematoma (HCC)   Anemia   Discharged Condition: good  Hospital Course:  Susan [name] is a 62 year old female presenting with symptoms concerning for acute  Cauda equina.  She underwent L3-4 lumbar decompression for evacuation of epidural hematoma on 10/12/2022.  Her operative course was uncomplicated however postoperatively she experienced acute respiratory distress and hypoxia.  Internal medicine was consulted for further evaluation and management.  She was ultimately appropriately weaned off of her O2 with stabilization of her pulse ox and blood pressure.  She passed a walking test on 10/18/2022.  It was determined to not have any home oxygen requirement.  Her numbness in her right leg improved throughout her hospital stay as did the weakness in her foot although still not back at baseline at discharge.  She was seen by therapy who initially felt she would need SNF placement at discharge however she improved significantly and was able to discharge home on postop [name] 7 with home health services.  She did sustain urinary retention secondary to her cauda equina however this improved and she was able to have her Foley catheter removed with return of spontaneous urination prior to discharge.  She was instructed to resume the pain medications that she was discharged with from her previous hospital stay.   Please note that she suffered from acute respiratory failure with hypoxia, likely a result of intubation during surgery and her underlying pulmonary condition.  Consults: internal  medicine  Significant Diagnostic Studies: none  Treatments: IV hydration, analgesia: acetaminophen and oxycodone, cardiac meds: carvedilol and lasix, therapies: PT and OT, and surgery: As above. Please see separately dictated operative report for further details  Discharge Exam: Blood pressure 109/63, pulse 65, temperature (!) 97.3 F (36.3 C), resp. rate 18, height 5\' 2"  (1.575 m), weight 95.7 kg, SpO2 98 %. AA Ox3 CNI   Strength:5/5 throughout LLE. RLE 5/5 HF, KE. 3/5 DF 2/5 EHL and PF  Incision: c/d/I with sutures in place. Open to air     Disposition: Discharge disposition: 06-Home-Health Care Svc       Discharge Instructions     Face-to-face encounter (required for Medicare/Medicaid patients)   Complete by: As directed    I Susan [name] certify that this patient is under my care and that I, or a nurse practitioner or physician's assistant working with me, had a face-to-face encounter that meets the physician face-to-face encounter requirements with this patient on 10/19/2022. The encounter with the patient was in whole, or in part for the following medical condition(s) which is the primary reason for home health care (List medical condition): right foot weakness   The encounter with the patient was in whole, or in part, for the following medical condition, which is the primary reason for home health care: post-op weakness   I certify that, based on my findings, the following services are medically necessary home health services: Physical therapy   Reason for Medically Necessary Home Health Services: Therapy- Investment banker, operational, Teacher, early years/pre and Stair Training   My clinical findings support the need for the above services: Unable to leave home safely without assistance and/or assistive device  Further, I certify that my clinical findings support that this patient is homebound due to:  Unable to leave home safely without assistance Ambulates short distances less than 300 feet      Home Health   Complete by: As directed    To provide the following care/treatments:  PT OT        Allergies as of 10/19/2022       Reactions   Wellbutrin [bupropion]    Patient reports made " deathly sick " years ago at appointment 03/18/20.   Lasix [furosemide] Other (See Comments)   Electrolyte imbalance        Medication List     STOP taking these medications    aspirin EC 81 MG tablet   carvedilol 25 MG tablet Commonly known as: COREG   sulfamethoxazole-trimethoprim 800-160 MG tablet Commonly known as: BACTRIM DS       TAKE these medications    acetaminophen 500 MG tablet Commonly known as: TYLENOL Take 1,000 mg by mouth every morning.   albuterol 108 (90 Base) MCG/ACT inhaler Commonly known as: VENTOLIN HFA Inhale 2 puffs into the lungs every 6 (six) hours as needed for wheezing or shortness of breath.   amLODipine 5 MG tablet Commonly known as: NORVASC Take 1 tablet (5 mg total) by mouth daily.   celecoxib 100 MG capsule Commonly known as: CeleBREX Take 1 capsule (100 mg total) by mouth 2 (two) times daily.   clotrimazole 1 % cream Commonly known as: LOTRIMIN Apply 1 application topically 2 (two) times daily as needed.   cyclobenzaprine 10 MG tablet Commonly known as: FLEXERIL Take 1 tablet (10 mg total) by mouth 3 (three) times daily as needed for muscle spasms.   DULoxetine 20 MG capsule Commonly known as: CYMBALTA TAKE 1 CAPSULE BY MOUTH EVERY [name] WITH 30 MG CAPSULE   DULoxetine 30 MG capsule Commonly known as: CYMBALTA Take 1 capsule (30 mg total) by mouth daily. Take daily with 20 mg cap.   gabapentin 300 MG capsule Commonly known as: NEURONTIN Take 600 mg by mouth 2 (two) times daily.   naloxone 4 MG/0.1ML Liqd nasal spray kit Commonly known as: NARCAN Place 0.4 mg into the nose once.   oxyCODONE 5 MG immediate release tablet Commonly known as: Oxy IR/ROXICODONE Take 1 tablet (5 mg total) by mouth every 3 (three) hours as  needed for moderate pain ((score 4 to 6)).   Ozempic (1 MG/DOSE) 4 MG/3ML Sopn Generic drug: Semaglutide (1 MG/DOSE) INJECT 1MG  UNDER THE SKIN (SUBCUTANEOUSLY) ONCE WEEKLY ON SUNDAY   pantoprazole 40 MG tablet Commonly known as: PROTONIX TAKE 1 TABLET(40 MG) BY MOUTH DAILY   rosuvastatin 20 MG tablet Commonly known as: Crestor Take 1 tablet (20 mg total) by mouth daily.   senna 8.6 MG Tabs tablet Commonly known as: SENOKOT Take 1 tablet (8.6 mg total) by mouth 2 (two) times daily.   spironolactone 25 MG tablet Commonly known as: ALDACTONE Take 1 tablet (25 mg total) by mouth daily.   telmisartan 80 MG tablet Commonly known as: MICARDIS TAKE ONE TABLET BY MOUTH DAILY What changed: See the new instructions.   traZODone 50 MG tablet Commonly known as: DESYREL Take 1 tablet (50 mg total) by mouth at bedtime.   Vitamin D (Ergocalciferol) 1.25 MG (50000 UNIT) Caps capsule Commonly known as: DRISDOL NEW PRESCRIPTION REQUEST: TAKE ONE CAPSULE BY MOUTH EVERY WEEK What changed: See the new instructions.         Signed: [name] [name]  10/19/2022, 3:36 PM

## 2022-10-19 NOTE — Progress Notes (Signed)
Physical Therapy Treatment Patient Details Name: Susan [name] MRN: [id number] DOB: 1960/11/03 Today's Date: 10/19/2022   History of Present Illness Susan [name] is a 62 y/o F with PMH: HTN, DM2, HLD, ETOH use, THA, PTSD, cocaine, bipolar, and L thalamic infarct (06/2022), s/p lumbar laminoforaminotomy (10/10/22). Presenting post L3-5 decompression with evacuation of epidural hematoma on 10/12/22 for Cauda Equina Syndrome.    PT Comments    Pt was sitting EOB upon arrival. She is A and O x 4 and agreeable to session. Pt was able to stand, ambulate, and perform stairs without physical assistance. She did have some SOB but Sao2 and HR stable. Pt wanting to DC directly home versus going to STR. She endorses have assistance available as needed. Acute PT will continue to follow and progress until Dc'd.   Recommendations for follow up therapy are one component of a multi-disciplinary discharge planning process, led by the attending physician.  Recommendations may be updated based on patient status, additional functional criteria and insurance authorization.     Assistance Recommended at Discharge Set up Supervision/Assistance  Patient can return home with the following A little help with walking and/or transfers;A little help with bathing/dressing/bathroom;Assistance with cooking/housework;Assist for transportation;Help with stairs or ramp for entrance   Equipment Recommendations  None recommended by PT       Precautions / Restrictions Precautions Precautions: Fall;Back Restrictions Weight Bearing Restrictions: No     Mobility  Bed Mobility Overal bed mobility: Needs Assistance Bed Mobility: Sit to Supine, Sidelying to Sit, Rolling  Sit to supine: Supervision General bed mobility comments: pt was seated EOB at start of session however was able to return to bed after OOB activity without physical assistance    Transfers Overall transfer level: Needs assistance Equipment used: Rolling  walker (2 wheels) Transfers: Sit to/from Stand Sit to Stand: Supervision  Ambulation/Gait Ambulation/Gait assistance: Supervision Gait Distance (Feet): 200 Feet Assistive device: Rolling walker (2 wheels) Gait Pattern/deviations: Step-through pattern Gait velocity: decreased  General Gait Details: Pt was able to ambulate and perform stairs with supervision only. She was mostly limited by fatigue. NO knee buckling or LOB noted.   Stairs Stairs: Yes Stairs assistance: Supervision Stair Management: Two rails Number of Stairs: 4 General stair comments: pt demonstrated safe performance of ascending/descending stairs with BUE support in RW.   Balance Overall balance assessment: Needs assistance Sitting-balance support: Feet supported Sitting balance-Leahy Scale: Good  Standing balance support: Bilateral upper extremity supported, During functional activity, Reliant on assistive device for balance Standing balance-Leahy Scale: Good       Cognition Arousal/Alertness: Awake/alert Behavior During Therapy: WFL for tasks assessed/performed Overall Cognitive Status: Within Functional Limits for tasks assessed    General Comments: Pt is A and O x 4           General Comments General comments (skin integrity, edema, etc.): Discussed amount of assistance available at DC. She states she has a son who is with her daily until 1pm and friends available as needed. HR and O2 stable on room air. pt did require 2 standing rest breaks due to SOB/fatigue.      Pertinent Vitals/Pain Pain Assessment Pain Assessment: 0-10 Pain Score: 6  Faces Pain Scale: Hurts a little bit Pain Location: low back Pain Descriptors / Indicators: Sore Pain Intervention(s): Limited activity within patient's tolerance, Monitored during session, Premedicated before session, Repositioned     PT Goals (current goals can now be found in the care plan section) Acute Rehab PT Goals Patient Stated  Goal: to go  home Progress towards PT goals: Progressing toward goals       PT Plan Discharge plan needs to be updated    Co-evaluation     PT goals addressed during session: Mobility/safety with mobility;Balance;Proper use of DME;Strengthening/ROM        AM-PAC PT "6 Clicks" Mobility   Outcome Measure  Help needed turning from your back to your side while in a flat bed without using bedrails?: None Help needed moving from lying on your back to sitting on the side of a flat bed without using bedrails?: None Help needed moving to and from a bed to a chair (including a wheelchair)?: A Little Help needed standing up from a chair using your arms (e.g., wheelchair or bedside chair)?: A Little Help needed to walk in hospital room?: A Little Help needed climbing 3-5 steps with a railing? : A Little 6 Click Score: 20    End of Session   Activity Tolerance: Patient tolerated treatment well Patient left: in bed;with call bell/phone within reach Nurse Communication: Mobility status PT Visit Diagnosis: Other abnormalities of gait and mobility (R26.89);Muscle weakness (generalized) (M62.81);Pain     Time: 8657-8469 PT Time Calculation (min) (ACUTE ONLY): 24 min  Charges:  $Gait Training: 8-22 mins $Therapeutic Activity: 8-22 mins                     Jetta Lout PTA 10/19/22, 10:33 AM

## 2022-10-19 NOTE — Care Management Important Message (Signed)
Important Message  Patient Details  Name: Susan [name] MRN: [id number] Date of Birth: 12-Aug-1960   Medicare Important Message Given:  Yes     Olegario Messier A Janella Rogala 10/19/2022, 10:35 AM

## 2022-10-19 NOTE — Progress Notes (Signed)
Discussed with neurosurgery.   No current IM concerns.  [facility] hospitalist service to sign off at this time. Thank you for allowing [name] to participate in the care of your patient.  Please feel free to reach out with any other questions or concerns  Lolita Patella MD  No charge

## 2022-10-19 NOTE — Progress Notes (Signed)
Discharge instructions reviewed with patient including followup visits and new medications.  Understanding was verbalized and all questions were answered.  IV removed without complication; patient tolerated well.  Patient awaiting ride. 

## 2022-10-19 NOTE — Progress Notes (Addendum)
   Neurosurgery Progress Note  History: Susan [name] is s/p L3-5 decompression with epidural hematoma evacuation for cauda equina.   POD7: Continues improved numbness in right leg POD6: NAEO POD5: Pt hypoxic overnight into the 70's. O2 restarted.  POD2: NAEO. Pt reporting improvement numbness and strength in RLE POD1: Patient reports improvement of her leg pain as well as some improvement in her lower extremity weakness.  Her immediate postop recovery was complicated by acute respiratory failure with hypoxia.  Internal medicine was consulted for further evaluation and treatment  Physical Exam: Vitals:   10/19/22 0546 10/19/22 0838  BP: 126/74 109/63  Pulse: (!) 57 65  Resp: 18 18  Temp: 97.6 F (36.4 C) (!) 97.3 F (36.3 C)  SpO2: 95% 98%    AA Ox3 CNI  Strength:5/5 throughout LLE. RLE 5/5 HF, KE. 3/5 DF 2/5 EHL and PF  Incision: c/d/I with sutures in place. Open to air   Data:  Other tests/results: Not applicable to her recent surgery.  Please see hospitalist consult note for further details  Assessment/Plan:  Susan [name] is a 62 year old female who underwent a right L4-5 laminoforaminotomy for synovial cyst resection on 10/10/2022.  This was complicated by CSF leak which was repaired intraoperatively.  Patient represented to the ER on postop day 2 with symptoms concerning for cauda equina.  She underwent a L3-5 decompression and evacuation of epidural hematoma on 10/12/2022.  She experienced hypoxia postoperatively and internal medicine was consulted for assistance.  - mobilize - pain control - DVT prophylaxis - HV removed 10/14/22 - OK to leave incision open to air. - PTOT - Hypoxia: CT angio on 10/17/2022 was negative.  Internal medicine recommending a walk test today.  Recommended that she continue with CPAP. -We appreciate internal medicine's assistance with additional medical concerns  Manning Charity PA-C Department of Neurosurgery

## 2022-10-19 NOTE — TOC Transition Note (Signed)
Transition of Care [facility]) - CM/SW Discharge Note   Patient Details  Name: Susan [name] MRN: [id number] Date of Birth: [birth year]  Transition of Care [facility]) CM/SW Contact:  Garret Reddish, RN Phone Number: 10/19/2022, 11:36 AM   Clinical Narrative:  Chart reviewed.  I have spoken with [name] today.  I have informed her that [address], [address], and [facility] has declined for a bed offer.  I have given her the bed offers that she currently has.  Patient reports that she would prefer to go home with home health services.  I have discussed home health services with [name].  She has not home health preference.  [name] reports that she has a supportive son and daughter that will be willing to assist with her needs at home.  [name] informs me that she has 2 wheeled rolling walker at home. BSC, and a raised toilet seat at home.    I have asked Barbara Cower with [facility] to accept home health referral for home health PT and OT.    Patient reports that her son or daughter will be able to transport her home today.    I have informed PA Duwayne Heck of the above information.   I have also informed staff nurse of the above information.      Final next level of care: Home w Home Health Services Barriers to Discharge: No Barriers Identified   Patient Goals and CMS Choice CMS Medicare.gov Compare Post Acute Care list provided to:: Patient Choice offered to / list presented to : Patient  Discharge Placement                         Discharge Plan and Services Additional resources added to the After Visit Summary for       Post Acute Care Choice: Skilled Nursing Facility          DME Arranged:  (Patient has all needed DME)         HH Arranged: PT, OT HH Agency: [facility] Date HH Agency Contacted: 10/19/22 Time HH Agency Contacted: 1000 Representative spoke with at [facility] Agency: Barbara Cower  Social Determinants of Health (SDOH)  Interventions SDOH Screenings   Food Insecurity: No Food Insecurity (10/12/2022)  Housing: Patient Declined (10/12/2022)  Transportation Needs: No Transportation Needs (10/12/2022)  Utilities: Not At Risk (10/12/2022)  Alcohol Screen: Low Risk  (06/02/2021)  Depression (PHQ2-9): Low Risk  (09/28/2022)  Recent Concern: Depression (PHQ2-9) - High Risk (08/31/2022)  Financial Resource Strain: Low Risk  (06/02/2021)  Physical Activity: Sufficiently Active (06/02/2021)  Social Connections: Moderately Isolated (06/02/2021)  Stress: No Stress Concern Present (06/02/2021)  Tobacco Use: High Risk (10/17/2022)     Readmission Risk Interventions    10/13/2022    4:20 PM  Readmission Risk Prevention Plan  Transportation Screening Complete  PCP or Specialist Appt within 5-7 Days Complete  Medication Review (RN CM) Complete

## 2022-10-20 ENCOUNTER — Telehealth: Payer: Self-pay | Admitting: *Deleted

## 2022-10-20 NOTE — Transitions of Care (Post Inpatient/ED Visit) (Signed)
   10/20/2022  Name: Susan [name] MRN: [id number] DOB: 1960/07/14  Today's TOC FU Call Status: Today's TOC FU Call Status:: Unsuccessul Call (1st Attempt) Unsuccessful Call (1st Attempt) Date: 10/20/22  Attempted to reach the patient regarding the most recent Inpatient/ED visit.  Follow Up Plan: Additional outreach attempts will be made to reach the patient to complete the Transitions of Care (Post Inpatient/ED visit) call.   Gean Maidens BSN RN [facility] [phone]

## 2022-10-21 ENCOUNTER — Telehealth: Payer: Self-pay | Admitting: *Deleted

## 2022-10-21 DIAGNOSIS — J9621 Acute and chronic respiratory failure with hypoxia: Secondary | ICD-10-CM | POA: Diagnosis not present

## 2022-10-21 DIAGNOSIS — J449 Chronic obstructive pulmonary disease, unspecified: Secondary | ICD-10-CM | POA: Diagnosis not present

## 2022-10-21 DIAGNOSIS — G834 Cauda equina syndrome: Secondary | ICD-10-CM | POA: Diagnosis not present

## 2022-10-21 NOTE — Transitions of Care (Post Inpatient/ED Visit) (Signed)
   10/21/2022  Name: Susan [name] MRN: [id number] DOB: [birth year]  Today's TOC FU Call Status: Today's TOC FU Call Status:: Unsuccessful Call (2nd Attempt) Unsuccessful Call (2nd Attempt) Date: 10/21/22  Attempted to reach the patient regarding the most recent Inpatient/ED visit.  Follow Up Plan: Additional outreach attempts will be made to reach the patient to complete the Transitions of Care (Post Inpatient/ED visit) call.   Gean Maidens BSN RN [facility] [phone]

## 2022-10-24 ENCOUNTER — Ambulatory Visit: Payer: 59 | Admitting: Family Medicine

## 2022-10-24 ENCOUNTER — Telehealth: Payer: Self-pay | Admitting: *Deleted

## 2022-10-24 NOTE — Progress Notes (Unsigned)
   REFERRING PHYSICIAN:  Nicholaus Corolla [address] [place],  [place] [address]   DOS: 10/12/22 L3-5 lumbar decompression with evacuation of epidural hematoma  DOS:   10/10/22 L3-5 lumbar decompression with evacuation of epidural hematoma   HISTORY OF PRESENT ILLNESS: Susan [name] is approximately 2 weeks status post L3-5 lumbar decompression with evacuation of epidural hematoma . As above, she went back to hospital 2 days after her first surgery.   On discharge, she was voiding with no issues. She had some respiratory distress as well, but was found stable to go home without oxygen. She was to continue on prn oxycodone and flexeril. She was discharged on 10/19/22 with home health.   She is doing very well. Still having some numbness in her right leg, but she is moving it much better. Can now lift it to get into her bed. She has some pain in her back. Stopped the oxycodone and is now taking norco (from Dr. Yves Dill). She is also on prn flexeril, celebrex, and tylenol.   She has HHPT/OT. She is voiding with no incontinence or leaking. No bowel issues.   She notes some depression. Does not want to hurt herself or others. Has call into her psychiatrist about seeing a therapist.    PHYSICAL EXAMINATION:  General: Patient is well developed, well nourished, calm, collected, and in no apparent distress.   NEUROLOGICAL:  General: In no acute distress.   Awake, alert, oriented to person, place, and time.  Pupils equal round and reactive to light.  Facial tone is symmetric.     Strength:            Side Iliopsoas Quads Hamstring PF DF EHL  R 5 5 5 4 4 4   L 5 5 5 5 5 5    Incision c/d/I, sutures removed   ROS (Neurologic):  Negative except as noted above  IMAGING: Nothing new to review.   ASSESSMENT/PLAN:  [name] is doing well s/p above surgery. Still with some weakness in right DF/PF/EHL, but it is improving. Treatment options reviewed with patient and  following plan made:   - I have advised the patient to lift up to 10 pounds until 6 weeks after surgery (follow up with Dr. Myer Haff).  - Reviewed wound care. Sutures removed today without complication.  - No bending, twisting, or lifting.  - Continue on current medications including prn norco, flexeril, celebrex. Will watch OTC tylenol with norco. She understands not to go over 3000mg  per day.  - Follow up as scheduled in 4 weeks and prn.   Advised to contact the office if any questions or concerns arise.  Drake Leach PA-C Department of neurosurgery

## 2022-10-24 NOTE — Transitions of Care (Post Inpatient/ED Visit) (Signed)
10/24/2022  Name: Susan [name] MRN: [id number] DOB: [birth year]  Today's TOC FU Call Status: Today's TOC FU Call Status:: Successful TOC FU Call Competed TOC FU Call Complete Date: 10/24/22  Transition Care Management Follow-up Telephone Call Date of Discharge: 10/19/22 Discharge Facility: [facility]) Type of Discharge: Inpatient Admission Primary Inpatient Discharge Diagnosis:: [id number] cauda equina syndrome/Acute respiratory failure with hypoxia How have you been since you were released from the hospital?: Better Any questions or concerns?: No  Items Reviewed: Did you receive and understand the discharge instructions provided?: Yes Medications obtained,verified, and reconciled?: Yes (Medications Reviewed) Any new allergies since your discharge?: No Dietary orders reviewed?: No Do you have support at home?: Yes People in Home: alone Name of Support/Comfort Primary Source: Renae Fickle  Medications Reviewed Today: Medications Reviewed Today     Reviewed by Luella Cook, RN (Case Manager) on 10/24/22 at 1540  Med List Status: <None>   Medication Order Taking? Sig Documenting Provider Last Dose Status Informant  acetaminophen (TYLENOL) 500 MG tablet [id number] Yes Take 1,000 mg by mouth every morning. [provider] Taking Active Self  albuterol (VENTOLIN HFA) 108 (90 Base) MCG/ACT inhaler [id number] Yes Inhale 2 puffs into the lungs every 6 (six) hours as needed for wheezing or shortness of breath. Coralyn Helling, MD Taking Active Self  amLODipine (NORVASC) 5 MG tablet [id number] Yes Take 1 tablet (5 mg total) by mouth daily. Glori Luis, MD Taking Active Self  celecoxib (CELEBREX) 100 MG capsule [id number] Yes Take 1 capsule (100 mg total) by mouth 2 (two) times daily. [name] Borders, PA Taking Active   clotrimazole (LOTRIMIN) 1 % cream [id number] Yes Apply 1 application topically 2 (two) times daily as needed. [provider]  Taking Active Self  cyclobenzaprine (FLEXERIL) 10 MG tablet [id number] Yes Take 1 tablet (10 mg total) by mouth 3 (three) times daily as needed for muscle spasms. [name] Borders, PA Taking Active   DULoxetine (CYMBALTA) 20 MG capsule [id number] Yes TAKE 1 CAPSULE BY MOUTH EVERY DAY WITH 30 MG CAPSULE Eappen, Saramma, MD Taking Active Self  DULoxetine (CYMBALTA) 30 MG capsule [id number] Yes Take 1 capsule (30 mg total) by mouth daily. Take daily with 20 mg cap. Jomarie Longs, MD Taking Active Self  gabapentin (NEURONTIN) 300 MG capsule [id number] Yes Take 600 mg by mouth 2 (two) times daily.  [provider] Taking Active Self           Med Note Halford Decamp   Fri Apr 22, 2016 12:30 PM)    naloxone [facility]) nasal spray 4 mg/0.1 mL [id number]  Place 0.4 mg into the nose once. [provider]  Active Self  oxyCODONE (OXY IR/ROXICODONE) 5 MG immediate release tablet [id number] Yes Take 1 tablet (5 mg total) by mouth every 3 (three) hours as needed for moderate pain ((score 4 to 6)). [name] Borders, PA Taking Active   pantoprazole (PROTONIX) 40 MG tablet [id number] Yes TAKE 1 TABLET(40 MG) BY MOUTH DAILY Glori Luis, MD Taking Active Self  rosuvastatin (CRESTOR) 20 MG tablet [id number] Yes Take 1 tablet (20 mg total) by mouth daily. Eulis Foster, FNP Taking Active Self  Semaglutide, 1 MG/DOSE, (OZEMPIC, 1 MG/DOSE,) 4 MG/3ML SOPN [id number] Yes INJECT 1MG  UNDER THE SKIN (SUBCUTANEOUSLY) ONCE WEEKLY ON SUNDAY Glori Luis, MD Taking Active   senna (SENOKOT) 8.6 MG TABS tablet [id number] Yes Take 1 tablet (8.6 mg total) by mouth 2 (two) times daily. Manning Charity  Nedra Hai, PA Taking Active   spironolactone (ALDACTONE) 25 MG tablet [id number] Yes Take 1 tablet (25 mg total) by mouth daily. Glori Luis, MD Taking Active Self  telmisartan (MICARDIS) 80 MG tablet [id number] Yes TAKE ONE TABLET BY MOUTH DAILY Glori Luis, MD Taking Active   traZODone (DESYREL) 50 MG  tablet [id number] Yes Take 1 tablet (50 mg total) by mouth at bedtime. Jomarie Longs, MD Taking Active Self  Vitamin D, Ergocalciferol, (DRISDOL) 1.25 MG (50000 UNIT) CAPS capsule [id number] Yes NEW PRESCRIPTION REQUEST: TAKE ONE CAPSULE BY MOUTH EVERY WEEK  Patient taking differently: 50,000 Units every 7 (seven) days. Sundays   Glori Luis, MD Taking Active Self            Home Care and Equipment/Supplies: Were Home Health Services Ordered?: Yes Name of Home Health Agency:: [facility] Has Agency set up a time to come to your home?: Yes First Home Health Visit Date: 10/21/22 Any new equipment or medical supplies ordered?: NA  Functional Questionnaire: Do you need assistance with bathing/showering or dressing?: No Do you need assistance with meal preparation?: Yes Do you need assistance with eating?: No Do you have difficulty maintaining continence: No Do you need assistance with getting out of bed/getting out of a chair/moving?: No Do you have difficulty managing or taking your medications?: No  Follow up appointments reviewed: PCP Follow-up appointment confirmed?: NA Specialist Hospital Follow-up appointment confirmed?: Yes Date of Specialist follow-up appointment?: 10/25/22 Follow-Up Specialty Provider:: Drake Leach PA Do you need transportation to your follow-up appointment?: No Do you understand care options if your condition(s) worsen?: Yes-patient verbalized understanding  SDOH Interventions Today    Flowsheet Row Most Recent Value  SDOH Interventions   Food Insecurity Interventions Intervention Not Indicated  Housing Interventions Intervention Not Indicated  Transportation Interventions Intervention Not Indicated, Patient Resources (Friends/Family)      Interventions Today    Flowsheet Row Most Recent Value  General Interventions   General Interventions Discussed/Reviewed General Interventions Discussed, General Interventions Reviewed, Doctor Visits   Doctor Visits Discussed/Reviewed Doctor Visits Discussed  Pharmacy Interventions   Pharmacy Dicussed/Reviewed Pharmacy Topics Discussed, Pharmacy Topics Reviewed      TOC Interventions Today    Flowsheet Row Most Recent Value  TOC Interventions   TOC Interventions Discussed/Reviewed TOC Interventions Discussed, TOC Interventions Reviewed       Gean Maidens BSN RN [facility] [phone]

## 2022-10-25 ENCOUNTER — Encounter: Payer: Self-pay | Admitting: Orthopedic Surgery

## 2022-10-25 ENCOUNTER — Ambulatory Visit (INDEPENDENT_AMBULATORY_CARE_PROVIDER_SITE_OTHER): Payer: 59 | Admitting: Orthopedic Surgery

## 2022-10-25 VITALS — BP 100/78 | HR 67 | Temp 98.2°F

## 2022-10-25 DIAGNOSIS — Z9889 Other specified postprocedural states: Secondary | ICD-10-CM

## 2022-10-25 DIAGNOSIS — G894 Chronic pain syndrome: Secondary | ICD-10-CM

## 2022-10-25 DIAGNOSIS — J449 Chronic obstructive pulmonary disease, unspecified: Secondary | ICD-10-CM | POA: Diagnosis not present

## 2022-10-25 DIAGNOSIS — Z09 Encounter for follow-up examination after completed treatment for conditions other than malignant neoplasm: Secondary | ICD-10-CM

## 2022-10-25 DIAGNOSIS — G834 Cauda equina syndrome: Secondary | ICD-10-CM | POA: Diagnosis not present

## 2022-10-25 DIAGNOSIS — M5416 Radiculopathy, lumbar region: Secondary | ICD-10-CM

## 2022-10-25 DIAGNOSIS — J9621 Acute and chronic respiratory failure with hypoxia: Secondary | ICD-10-CM | POA: Diagnosis not present

## 2022-10-25 DIAGNOSIS — S064XAD Epidural hemorrhage with loss of consciousness status unknown, subsequent encounter: Secondary | ICD-10-CM

## 2022-10-26 DIAGNOSIS — J449 Chronic obstructive pulmonary disease, unspecified: Secondary | ICD-10-CM | POA: Diagnosis not present

## 2022-10-26 DIAGNOSIS — J9621 Acute and chronic respiratory failure with hypoxia: Secondary | ICD-10-CM | POA: Diagnosis not present

## 2022-10-26 DIAGNOSIS — G834 Cauda equina syndrome: Secondary | ICD-10-CM | POA: Diagnosis not present

## 2022-10-27 ENCOUNTER — Telehealth: Payer: Self-pay | Admitting: *Deleted

## 2022-10-27 ENCOUNTER — Telehealth: Payer: Self-pay | Admitting: Family Medicine

## 2022-10-27 NOTE — Telephone Encounter (Signed)
Esther from [facility] called in asking if provider can put OT orders for pt for 1w2. Any questions or concern, she's available @ [phone].

## 2022-10-27 NOTE — Progress Notes (Signed)
  Care Coordination   Note   10/27/2022 Name: Susan [name] MRN: [id number] DOB: 1960/10/21  Susan [name] is a 62 y.o. year old female who sees Birdie Sons, Yehuda Mao, MD for primary care. I reached out to [employer] by phone today to offer care coordination services.  [name] was given information about Care Coordination services today including:   The Care Coordination services include support from the care team which includes your Nurse Coordinator, Clinical Social Worker, or Pharmacist.  The Care Coordination team is here to help remove barriers to the health concerns and goals most important to you. Care Coordination services are voluntary, and the patient may decline or stop services at any time by request to their care team member.   Care Coordination Consent Status: Patient did not agree to participate in care coordination services at this time.  Follow up plan:  none indicated - pt already has established with Licensed Clical SW who does homes visits.   Encounter Outcome:  Pt. Refused  Burman Nieves, CCMA Care Coordination Care Guide Direct Dial: [phone]

## 2022-10-27 NOTE — Progress Notes (Signed)
  Care Coordination  Outreach Note  10/27/2022 Name: [name] MRN: [id number] DOB: [birth year]   Care Coordination Outreach Attempts: An unsuccessful telephone outreach was attempted today to offer the patient information about available care coordination services.  Follow Up Plan:  Additional outreach attempts will be made to offer the patient care coordination information and services.   Encounter Outcome:  No Answer  Burman Nieves, CCMA Care Coordination Care Guide Direct Dial: [phone]

## 2022-10-27 NOTE — Telephone Encounter (Signed)
What does she need the OT orders for?  If it is related to her recent surgery her surgeon should place those orders.

## 2022-10-27 NOTE — Telephone Encounter (Signed)
Left message to call the office back regarding what the OT orders are for? If it is related to her recent surgery her surgeon should place those orders.

## 2022-10-28 DIAGNOSIS — J9621 Acute and chronic respiratory failure with hypoxia: Secondary | ICD-10-CM | POA: Diagnosis not present

## 2022-10-28 DIAGNOSIS — G834 Cauda equina syndrome: Secondary | ICD-10-CM | POA: Diagnosis not present

## 2022-10-28 DIAGNOSIS — J449 Chronic obstructive pulmonary disease, unspecified: Secondary | ICD-10-CM | POA: Diagnosis not present

## 2022-10-28 NOTE — Telephone Encounter (Signed)
Spoke to Patient with Dr. Purvis Sheffield recommendations. Patient understands and is agreeable.

## 2022-10-28 NOTE — Telephone Encounter (Signed)
Her BP is certainly on the low side. She should stop her amlodipine and monitor her BP. If her BP start to go above 130/80 with this change she needs to contact us. If she starts to have light headedness or if she passes out she needs to be seen right away for her BP.

## 2022-10-28 NOTE — Telephone Encounter (Signed)
Patient states she had 3 surgeries in 2 days and her foot is not wanting to work but she will get back with Dr. Leilani Able for that. Patient states her BP has been low. Yesterday's BP 72/42 and today 99/59.  Patient has an appointment with you on 11/14/22 at 9:45 will she need to come in sooner for her low BP or change anything before then?

## 2022-10-28 NOTE — Telephone Encounter (Signed)
Pt returned Elizabeth CMA call. Transferred. 

## 2022-10-31 DIAGNOSIS — J449 Chronic obstructive pulmonary disease, unspecified: Secondary | ICD-10-CM | POA: Diagnosis not present

## 2022-10-31 DIAGNOSIS — G834 Cauda equina syndrome: Secondary | ICD-10-CM | POA: Diagnosis not present

## 2022-10-31 DIAGNOSIS — J9621 Acute and chronic respiratory failure with hypoxia: Secondary | ICD-10-CM | POA: Diagnosis not present

## 2022-10-31 NOTE — Anesthesia Postprocedure Evaluation (Signed)
Anesthesia Post Note  Patient: Susan [name]  Procedure(s) Performed: LUMBAR LAMINECTOMY/ DECOMPRESSION WITH MET-RX  Patient location during evaluation: PACU Anesthesia Type: General Level of consciousness: awake and alert Pain management: pain level controlled Vital Signs Assessment: post-procedure vital signs reviewed and stable Respiratory status: spontaneous breathing, nonlabored ventilation, respiratory function stable and patient connected to nasal cannula oxygen Cardiovascular status: blood pressure returned to baseline and stable Postop Assessment: no apparent nausea or vomiting Anesthetic complications: no   No notable events documented.   Last Vitals:  Vitals:   10/19/22 0546 10/19/22 0838  BP: 126/74 109/63  Pulse: (!) 57 65  Resp: 18 18  Temp: 36.4 C (!) 36.3 C  SpO2: 95% 98%    Last Pain:  Vitals:   10/19/22 1018  TempSrc:   PainSc: 2                  Lenard Simmer

## 2022-11-01 ENCOUNTER — Other Ambulatory Visit: Payer: Self-pay | Admitting: Psychiatry

## 2022-11-01 DIAGNOSIS — G4700 Insomnia, unspecified: Secondary | ICD-10-CM

## 2022-11-01 DIAGNOSIS — G834 Cauda equina syndrome: Secondary | ICD-10-CM | POA: Diagnosis not present

## 2022-11-01 DIAGNOSIS — J449 Chronic obstructive pulmonary disease, unspecified: Secondary | ICD-10-CM | POA: Diagnosis not present

## 2022-11-01 DIAGNOSIS — J9621 Acute and chronic respiratory failure with hypoxia: Secondary | ICD-10-CM | POA: Diagnosis not present

## 2022-11-02 ENCOUNTER — Encounter: Payer: Self-pay | Admitting: Nurse Practitioner

## 2022-11-02 ENCOUNTER — Ambulatory Visit (INDEPENDENT_AMBULATORY_CARE_PROVIDER_SITE_OTHER): Payer: 59 | Admitting: Nurse Practitioner

## 2022-11-02 ENCOUNTER — Telehealth: Payer: Self-pay | Admitting: Neurosurgery

## 2022-11-02 VITALS — BP 118/60 | HR 67 | Temp 98.0°F | Ht 62.0 in | Wt 208.0 lb

## 2022-11-02 DIAGNOSIS — R197 Diarrhea, unspecified: Secondary | ICD-10-CM | POA: Diagnosis not present

## 2022-11-02 DIAGNOSIS — M5416 Radiculopathy, lumbar region: Secondary | ICD-10-CM

## 2022-11-02 DIAGNOSIS — Z8673 Personal history of transient ischemic attack (TIA), and cerebral infarction without residual deficits: Secondary | ICD-10-CM

## 2022-11-02 DIAGNOSIS — R29898 Other symptoms and signs involving the musculoskeletal system: Secondary | ICD-10-CM

## 2022-11-02 DIAGNOSIS — Z9889 Other specified postprocedural states: Secondary | ICD-10-CM

## 2022-11-02 NOTE — Telephone Encounter (Signed)
[facility] does not supply DME, patient aware to pick up order at the office.

## 2022-11-02 NOTE — Telephone Encounter (Signed)
Ok per discussion with Kennyth Arnold

## 2022-11-02 NOTE — Progress Notes (Signed)
Bethanie Dicker, NP-C Phone: [phone]  Susan [name] is a 62 y.o. female who presents today for diarrhea.   Diarrhea: Patient complains of diarrhea. Onset of diarrhea was several months ago. Diarrhea is occurring approximately several times per day. Patient describes diarrhea as watery. Diarrhea has been associated with recent antibiotic therapy and hospitalization .  Patient reports her diarrhea started initially after her hospitalization in March for a stroke. It has continued intermittently until recently worsening after being hospitalized for 9 days. She had 10+ episodes of diarrhea yesterday. She has not had any bowel movements today. Patient denies blood in stool, fever, recent travel, significant abdominal pain.  Previous visits for diarrhea: none. Evaluation to date: none. She is drinking fluids. She has a decreased appetite. She does have an appointment scheduled with Gastroenterology for July 9th.   Social History   Tobacco Use  Smoking Status Every Day   Packs/day: 0.50   Years: 41.00   Additional pack years: 0.00   Total pack years: 20.50   Types: Cigarettes  Smokeless Tobacco Never    Current Outpatient Medications on File Prior to Visit  Medication Sig Dispense Refill   acetaminophen (TYLENOL) 500 MG tablet Take 1,000 mg by mouth 2 (two) times daily.     albuterol (VENTOLIN HFA) 108 (90 Base) MCG/ACT inhaler Inhale 2 puffs into the lungs every 6 (six) hours as needed for wheezing or shortness of breath. 8 g 2   carvedilol (COREG) 12.5 MG tablet Take 12.5 mg by mouth 2 (two) times daily.     celecoxib (CELEBREX) 100 MG capsule Take 1 capsule (100 mg total) by mouth 2 (two) times daily. 60 capsule 0   cyclobenzaprine (FLEXERIL) 5 MG tablet Take 5 mg by mouth 2 (two) times daily.     DULoxetine (CYMBALTA) 20 MG capsule TAKE 1 CAPSULE BY MOUTH EVERY DAY WITH 30 MG CAPSULE 30 capsule 3   DULoxetine (CYMBALTA) 30 MG capsule Take 1 capsule (30 mg total) by mouth daily. Take  daily with 20 mg cap. 30 capsule 3   gabapentin (NEURONTIN) 300 MG capsule Take 600 mg by mouth 2 (two) times daily.      HYDROcodone-acetaminophen (NORCO/VICODIN) 5-325 MG tablet Take 1 tablet by mouth daily as needed for moderate pain.     naloxone (NARCAN) nasal spray 4 mg/0.1 mL Place 0.4 mg into the nose once.     pantoprazole (PROTONIX) 40 MG tablet TAKE 1 TABLET(40 MG) BY MOUTH DAILY 90 tablet 3   rosuvastatin (CRESTOR) 20 MG tablet Take 1 tablet (20 mg total) by mouth daily. 30 tablet 3   Semaglutide, 1 MG/DOSE, (OZEMPIC, 1 MG/DOSE,) 4 MG/3ML SOPN INJECT 1MG  UNDER THE SKIN (SUBCUTANEOUSLY) ONCE WEEKLY ON SUNDAY 3 mL 1   spironolactone (ALDACTONE) 25 MG tablet Take 1 tablet (25 mg total) by mouth daily. 90 tablet 1   telmisartan (MICARDIS) 80 MG tablet TAKE ONE TABLET BY MOUTH DAILY 90 tablet 1   traZODone (DESYREL) 50 MG tablet TAKE 1 TABLET(50 MG) BY MOUTH AT BEDTIME 30 tablet 1   Vitamin D, Ergocalciferol, (DRISDOL) 1.25 MG (50000 UNIT) CAPS capsule NEW PRESCRIPTION REQUEST: TAKE ONE CAPSULE BY MOUTH EVERY WEEK (Patient taking differently: 50,000 Units every 7 (seven) days. Sundays) 13 capsule 1   amLODipine (NORVASC) 5 MG tablet Take 1 tablet (5 mg total) by mouth daily. (Patient not taking: Reported on 11/02/2022) 90 tablet 2   clotrimazole (LOTRIMIN) 1 % cream Apply 1 application topically 2 (two) times daily as needed. (Patient  not taking: Reported on 11/02/2022)     senna (SENOKOT) 8.6 MG TABS tablet Take 1 tablet (8.6 mg total) by mouth 2 (two) times daily. (Patient not taking: Reported on 11/02/2022) 120 tablet 0   No current facility-administered medications on file prior to visit.    ROS see history of present illness  Objective  Physical Exam Vitals:   11/02/22 1452  BP: 118/60  Pulse: 67  Temp: 98 F (36.7 C)  SpO2: 98%    BP Readings from Last 3 Encounters:  11/02/22 118/60  10/25/22 100/78  10/19/22 109/63   Wt Readings from Last 3 Encounters:  11/02/22 208  lb (94.3 kg)  10/12/22 211 lb (95.7 kg)  10/10/22 211 lb 13.8 oz (96.1 kg)    Physical Exam Constitutional:      General: She is not in acute distress.    Appearance: Normal appearance.  HENT:     Head: Normocephalic.  Cardiovascular:     Rate and Rhythm: Normal rate and regular rhythm.     Heart sounds: Normal heart sounds.  Pulmonary:     Effort: Pulmonary effort is normal.     Breath sounds: Normal breath sounds.  Abdominal:     General: Abdomen is flat. Bowel sounds are normal. There is no distension.     Palpations: Abdomen is soft.     Tenderness: There is no abdominal tenderness.  Skin:    General: Skin is warm and dry.  Neurological:     General: No focal deficit present.     Mental Status: She is alert.  Psychiatric:        Mood and Affect: Mood normal.        Behavior: Behavior normal.    Assessment/Plan: Please see individual problem list.  Diarrhea of presumed infectious origin Assessment & Plan: Afebrile. No bowel movements today. Will check lab work as outlined, C. Diff and GI stool panel. Encouraged adequate fluid intake. Advised bland diet, she can increase as tolerated. Return precautions given to patient. Follow up with GI as scheduled.   Orders: -     C Difficile Quick Screen w PCR reflex; Future -     Gastrointestinal Panel by PCR , Stool; Future -     CBC with Differential/Platelet -     Comprehensive metabolic panel   Return if symptoms worsen or fail to improve.   Bethanie Dicker, NP-C [facility] - [employer]

## 2022-11-02 NOTE — Telephone Encounter (Signed)
Referral/order placed for tub bench with suction cups. If [facility] doesn't handle DME, then the patient can take the referral to a medical supply store of her choosing.

## 2022-11-02 NOTE — Telephone Encounter (Signed)
Susan [name] with [facility] called in today asking if we could possibly write a prescription for a tub bench with suction cups. She states Susan [name] is struggling between her stroke and then her sx with [name] and would benefit highly from this.  If we are able to [facility] requests we contact the pt directly about it.

## 2022-11-03 LAB — COMPREHENSIVE METABOLIC PANEL
ALT: 9 U/L (ref 0–35)
AST: 14 U/L (ref 0–37)
Albumin: 4.3 g/dL (ref 3.5–5.2)
Alkaline Phosphatase: 86 U/L (ref 39–117)
BUN: 20 mg/dL (ref 6–23)
CO2: 22 mEq/L (ref 19–32)
Calcium: 9.7 mg/dL (ref 8.4–10.5)
Chloride: 96 mEq/L (ref 96–112)
Creatinine, Ser: 1.51 mg/dL — ABNORMAL HIGH (ref 0.40–1.20)
GFR: 37.01 mL/min — ABNORMAL LOW (ref >60.00)
Glucose, Bld: 80 mg/dL (ref 70–99)
Potassium: 4.8 mEq/L (ref 3.5–5.1)
Sodium: 129 mEq/L — ABNORMAL LOW (ref 135–145)
Total Bilirubin: 0.6 mg/dL (ref 0.2–1.2)
Total Protein: 7.2 g/dL (ref 6.0–8.3)

## 2022-11-03 LAB — CBC WITH DIFFERENTIAL/PLATELET
Basophils Absolute: 0 10*3/uL (ref 0.0–0.1)
Basophils Relative: 0.3 % (ref 0.0–3.0)
Eosinophils Absolute: 0.4 10*3/uL (ref 0.0–0.7)
Eosinophils Relative: 3.2 % (ref 0.0–5.0)
HCT: 33.1 % — ABNORMAL LOW (ref 36.0–46.0)
Hemoglobin: 11.2 g/dL — ABNORMAL LOW (ref 12.0–15.0)
Lymphocytes Relative: 29.1 % (ref 12.0–46.0)
Lymphs Abs: 3.3 10*3/uL (ref 0.7–4.0)
MCHC: 33.9 g/dL (ref 30.0–36.0)
MCV: 94.5 fl (ref 78.0–100.0)
Monocytes Absolute: 0.6 10*3/uL (ref 0.1–1.0)
Monocytes Relative: 5.6 % (ref 3.0–12.0)
Neutro Abs: 6.9 10*3/uL (ref 1.4–7.7)
Neutrophils Relative %: 61.8 % (ref 43.0–77.0)
Platelets: 254 10*3/uL (ref 150.0–400.0)
RBC: 3.5 Mil/uL — ABNORMAL LOW (ref 3.87–5.11)
RDW: 12.9 % (ref 11.5–15.5)
WBC: 11.2 10*3/uL — ABNORMAL HIGH (ref 4.0–10.5)

## 2022-11-04 ENCOUNTER — Other Ambulatory Visit (INDEPENDENT_AMBULATORY_CARE_PROVIDER_SITE_OTHER): Payer: 59

## 2022-11-04 ENCOUNTER — Other Ambulatory Visit: Payer: Self-pay | Admitting: Nurse Practitioner

## 2022-11-04 ENCOUNTER — Telehealth: Payer: Self-pay | Admitting: Family Medicine

## 2022-11-04 DIAGNOSIS — G834 Cauda equina syndrome: Secondary | ICD-10-CM | POA: Diagnosis not present

## 2022-11-04 DIAGNOSIS — R197 Diarrhea, unspecified: Secondary | ICD-10-CM

## 2022-11-04 DIAGNOSIS — E559 Vitamin D deficiency, unspecified: Secondary | ICD-10-CM | POA: Diagnosis not present

## 2022-11-04 DIAGNOSIS — J449 Chronic obstructive pulmonary disease, unspecified: Secondary | ICD-10-CM | POA: Diagnosis not present

## 2022-11-04 DIAGNOSIS — J9621 Acute and chronic respiratory failure with hypoxia: Secondary | ICD-10-CM | POA: Diagnosis not present

## 2022-11-04 LAB — BASIC METABOLIC PANEL
BUN: 24 mg/dL — ABNORMAL HIGH (ref 6–23)
CO2: 25 mEq/L (ref 19–32)
Calcium: 9.9 mg/dL (ref 8.4–10.5)
Chloride: 97 mEq/L (ref 96–112)
Creatinine, Ser: 1.65 mg/dL — ABNORMAL HIGH (ref 0.40–1.20)
GFR: 33.28 mL/min — ABNORMAL LOW (ref >60.00)
Glucose, Bld: 100 mg/dL — ABNORMAL HIGH (ref 70–99)
Potassium: 4.9 mEq/L (ref 3.5–5.1)
Sodium: 131 mEq/L — ABNORMAL LOW (ref 135–145)

## 2022-11-04 LAB — VITAMIN D 25 HYDROXY (VIT D DEFICIENCY, FRACTURES): VITD: 41.34 ng/mL (ref 30.00–100.00)

## 2022-11-04 NOTE — Telephone Encounter (Signed)
Ordered

## 2022-11-04 NOTE — Assessment & Plan Note (Addendum)
Afebrile. No bowel movements today. Will check lab work as outlined, C. Diff and GI stool panel. Encouraged adequate fluid intake. Advised bland diet, she can increase as tolerated. Return precautions given to patient. Follow up with GI as scheduled.

## 2022-11-04 NOTE — Telephone Encounter (Signed)
Patient need orders  °

## 2022-11-05 ENCOUNTER — Other Ambulatory Visit: Payer: Self-pay

## 2022-11-05 ENCOUNTER — Emergency Department
Admission: EM | Admit: 2022-11-05 | Discharge: 2022-11-05 | Disposition: A | Discharge status: Home / Self Care | Payer: 59 | Attending: Emergency Medicine | Admitting: Emergency Medicine

## 2022-11-05 DIAGNOSIS — E860 Dehydration: Secondary | ICD-10-CM | POA: Diagnosis not present

## 2022-11-05 LAB — CBC
HCT: 33.5 % — ABNORMAL LOW (ref 36.0–46.0)
Hemoglobin: 11.3 g/dL — ABNORMAL LOW (ref 12.0–15.0)
MCH: 32.2 pg (ref 26.0–34.0)
MCHC: 33.7 g/dL (ref 30.0–36.0)
MCV: 95.4 fL (ref 80.0–100.0)
Platelets: 217 10*3/uL (ref 150–400)
RBC: 3.51 MIL/uL — ABNORMAL LOW (ref 3.87–5.11)
RDW: 12.6 % (ref 11.5–15.5)
WBC: 9.5 10*3/uL (ref 4.0–10.5)
nRBC: 0 % (ref 0.0–0.2)

## 2022-11-05 LAB — BASIC METABOLIC PANEL
Anion gap: 7 (ref 5–15)
BUN: 21 mg/dL (ref 8–23)
CO2: 20 mmol/L — ABNORMAL LOW (ref 22–32)
Calcium: 9 mg/dL (ref 8.9–10.3)
Chloride: 102 mmol/L (ref 98–111)
Creatinine, Ser: 1.41 mg/dL — ABNORMAL HIGH (ref 0.44–1.00)
GFR, Estimated: 42 mL/min — ABNORMAL LOW (ref >60)
Glucose, Bld: 100 mg/dL — ABNORMAL HIGH (ref 70–99)
Potassium: 4.3 mmol/L (ref 3.5–5.1)
Sodium: 129 mmol/L — ABNORMAL LOW (ref 135–145)

## 2022-11-05 MED ORDER — SODIUM CHLORIDE 0.9 % IV SOLN: Freq: Once | INTRAVENOUS | Status: AC

## 2022-11-05 NOTE — ED Triage Notes (Signed)
Recent back surgery.  Was called yesterday by PCP and told that her kidneys were not working properly and she needed to come in to get fluid.

## 2022-11-05 NOTE — ED Provider Notes (Signed)
[facility] Provider Note    Event Date/Time   First MD Initiated Contact with Patient 11/05/22 0815     (approximate)   History   Dehydration   HPI  Susan [name] is a 62 y.o. female who had back surgery in early June, had labs done twice in the past week and elevation in creatinine and BUN.  Sent to the emergency department for fluids.  Overall she feels well and reports she had been having diarrhea for months but has not had any in the last several days.  She feels this is likely the cause of her dehydration     Physical Exam   Triage Vital Signs: ED Triage Vitals  Enc Vitals Group     BP 11/05/22 0812 (!) 96/56     Pulse Rate 11/05/22 0812 75     Resp 11/05/22 0812 16     Temp 11/05/22 0812 98.3 F (36.8 C)     Temp Source 11/05/22 0812 Oral     SpO2 11/05/22 0812 97 %     Weight 11/05/22 0811 94.3 kg (207 lb 14.3 oz)     Height 11/05/22 0811 1.575 m (5\' 2" )     Head Circumference --      Peak Flow --      Pain Score 11/05/22 0811 0     Pain Loc --      Pain Edu? --      Excl. in GC? --     Most recent vital signs: Vitals:   11/05/22 0900 11/05/22 0943  BP: (!) 92/53   Pulse: 63   Resp: 14   Temp:  97.8 F (36.6 C)  SpO2: 100%      General: Awake, no distress.  CV:  Good peripheral perfusion.  Resp:  Normal effort.  Abd:  No distention.  Soft, nontender Other:     ED Results / Procedures / Treatments   Labs (all labs ordered are listed, but only abnormal results are displayed) Labs Reviewed  CBC - Abnormal; Notable for the following components:      Result Value   RBC 3.51 (*)    Hemoglobin 11.3 (*)    HCT 33.5 (*)    All other components within normal limits  BASIC METABOLIC PANEL - Abnormal; Notable for the following components:   Sodium 129 (*)    CO2 20 (*)    Glucose, Bld 100 (*)    Creatinine, Ser 1.41 (*)    GFR, Estimated 42 (*)    All other components within normal limits      EKG     RADIOLOGY     PROCEDURES:  Critical Care performed:   Procedures   MEDICATIONS ORDERED IN ED: Medications  0.9 %  sodium chloride infusion (0 mLs Intravenous Stopped 11/05/22 0940)     IMPRESSION / MDM / ASSESSMENT AND PLAN / ED COURSE  I reviewed the triage vital signs and the nursing notes. Patient's presentation is most consistent with acute illness / injury with system symptoms.  Patient with reports of diarrhea, reviewed elevated BUN and creatinine yesterday of 24 and 1.65, patient's baseline creatinine is typically under 1.  Will recheck BUN/creatinine, give 1 L IV fluids  Creatinine of 1.4 here, improved from yesterday, patient feeling much better after IV fluids, appropriate for discharge with outpatient follow-up        FINAL CLINICAL IMPRESSION(S) / ED DIAGNOSES   Final diagnoses:  Dehydration     Rx /  DC Orders   ED Discharge Orders     None        Note:  This document was prepared using Dragon voice recognition software and may include unintentional dictation errors.   Jene Every, MD 11/05/22 [phone]

## 2022-11-07 ENCOUNTER — Other Ambulatory Visit: Payer: Self-pay | Admitting: Neurosurgery

## 2022-11-10 ENCOUNTER — Other Ambulatory Visit
Admission: RE | Admit: 2022-11-10 | Discharge: 2022-11-10 | Disposition: A | Discharge status: Home / Self Care | Payer: 59 | Source: Ambulatory Visit | Attending: Nurse Practitioner | Admitting: Nurse Practitioner

## 2022-11-10 DIAGNOSIS — R197 Diarrhea, unspecified: Secondary | ICD-10-CM | POA: Diagnosis not present

## 2022-11-10 LAB — GASTROINTESTINAL PANEL BY PCR, STOOL (REPLACES STOOL CULTURE)

## 2022-11-10 LAB — C DIFFICILE QUICK SCREEN W PCR REFLEX
C Diff antigen: NEGATIVE
C Diff interpretation: NOT DETECTED
C Diff toxin: NEGATIVE

## 2022-11-11 ENCOUNTER — Telehealth: Payer: Self-pay | Admitting: *Deleted

## 2022-11-11 ENCOUNTER — Other Ambulatory Visit: Payer: Self-pay | Admitting: Family

## 2022-11-11 NOTE — Telephone Encounter (Signed)
Pt is on lab schedule again Monday 11/14/22 for Vit D . Just had it checked on 11/04/22.  No orders found, please advise if pt no longer needs this appt.

## 2022-11-14 ENCOUNTER — Other Ambulatory Visit (INDEPENDENT_AMBULATORY_CARE_PROVIDER_SITE_OTHER): Payer: 59

## 2022-11-14 ENCOUNTER — Other Ambulatory Visit: Payer: Self-pay | Admitting: Family Medicine

## 2022-11-14 DIAGNOSIS — M48062 Spinal stenosis, lumbar region with neurogenic claudication: Secondary | ICD-10-CM | POA: Diagnosis not present

## 2022-11-14 DIAGNOSIS — M5412 Radiculopathy, cervical region: Secondary | ICD-10-CM | POA: Diagnosis not present

## 2022-11-14 DIAGNOSIS — M5126 Other intervertebral disc displacement, lumbar region: Secondary | ICD-10-CM | POA: Diagnosis not present

## 2022-11-14 DIAGNOSIS — M5416 Radiculopathy, lumbar region: Secondary | ICD-10-CM | POA: Diagnosis not present

## 2022-11-14 DIAGNOSIS — N179 Acute kidney failure, unspecified: Secondary | ICD-10-CM | POA: Diagnosis not present

## 2022-11-14 DIAGNOSIS — M5030 Other cervical disc degeneration, unspecified cervical region: Secondary | ICD-10-CM | POA: Diagnosis not present

## 2022-11-14 DIAGNOSIS — Z79899 Other long term (current) drug therapy: Secondary | ICD-10-CM | POA: Diagnosis not present

## 2022-11-14 LAB — BASIC METABOLIC PANEL
BUN: 16 mg/dL (ref 6–23)
CO2: 24 mEq/L (ref 19–32)
Calcium: 9.5 mg/dL (ref 8.4–10.5)
Chloride: 96 mEq/L (ref 96–112)
Creatinine, Ser: 1.34 mg/dL — ABNORMAL HIGH (ref 0.40–1.20)
GFR: 42.71 mL/min — ABNORMAL LOW (ref >60.00)
Glucose, Bld: 82 mg/dL (ref 70–99)
Potassium: 4.8 mEq/L (ref 3.5–5.1)
Sodium: 130 mEq/L — ABNORMAL LOW (ref 135–145)

## 2022-11-14 NOTE — Telephone Encounter (Signed)
Orders placed today.

## 2022-11-15 ENCOUNTER — Encounter: Payer: Self-pay | Admitting: Gastroenterology

## 2022-11-15 ENCOUNTER — Ambulatory Visit (INDEPENDENT_AMBULATORY_CARE_PROVIDER_SITE_OTHER): Payer: 59 | Admitting: Gastroenterology

## 2022-11-15 ENCOUNTER — Other Ambulatory Visit: Payer: Self-pay | Admitting: Psychiatry

## 2022-11-15 VITALS — BP 113/66 | HR 64 | Temp 98.9°F | Ht 62.0 in | Wt 209.6 lb

## 2022-11-15 DIAGNOSIS — K5909 Other constipation: Secondary | ICD-10-CM | POA: Diagnosis not present

## 2022-11-15 DIAGNOSIS — Z1211 Encounter for screening for malignant neoplasm of colon: Secondary | ICD-10-CM | POA: Diagnosis not present

## 2022-11-15 DIAGNOSIS — F4310 Post-traumatic stress disorder, unspecified: Secondary | ICD-10-CM

## 2022-11-15 DIAGNOSIS — F3131 Bipolar disorder, current episode depressed, mild: Secondary | ICD-10-CM

## 2022-11-15 NOTE — Patient Instructions (Signed)
Please give [name] a call when you are ready to schedule your colonoscopy in September 2024. Thank you!

## 2022-11-15 NOTE — Progress Notes (Signed)
Wyline Mood MD, MRCP(U.K) [address]  [address]  [place], [place] [address]  Main: [phone]  Fax: [phone]   Gastroenterology Consultation  Referring Provider:     Glori Luis, MD Primary Care Physician:  Glori Luis, MD Primary Gastroenterologist:  Dr. Wyline Mood  Reason for Consultation:     Dysphagia        HPI:   Susan [name] is a 62 y.o. y/o female referred for consultation & management  by Dr. Birdie Sons, Yehuda Mao, MD.     She has been referred to see me for dysphagia.  She absolutely denies any issues with swallowing says her swallowing is completely normal she says her main issue is constipation she can go for 4 to 5 days without a bowel movement then she has explosive diarrhea.  She has had issues with constipation after her back surgery when she was in the hospital required laxatives.  She consumes artificial sugars in terms of equal which causes her to have watery stools at times. Last colonoscopy attempt in 2022 was incomplete due to poor prep.  Was recommended repeat colonoscopy she did not schedule the same.  10/17/2022 CT angiogram chest pulm embolism protocol shows cardiomegaly.  11/05/2022 hemoglobin 11.3 g. Past Medical History:  Diagnosis Date   ADD (attention deficit disorder)    Alcohol abuse    Anemia    Anxiety    Aortic atherosclerosis (HCC) 02/20/2018   Chest CT Sept 2019   Arthritis    rheumatoid arthritis   Asthma    Bipolar disorder (HCC)    Centrilobular emphysema (HCC)    Cocaine use disorder, moderate, in sustained remission (HCC)    Constipation    Degenerative disc disease at L5-S1 level 09/28/2016   See ortho note May 2018   Depression    bipolar, hx of suicide attempt   Diabetes mellitus, type 2 (HCC)    Diverticulitis 2013   DOE (dyspnea on exertion)    Drug use    Family history of adverse reaction to anesthesia    mom-delayed emergence   GERD (gastroesophageal reflux disease)    H/O suicide  attempt    slit wrists   Hepatitis C 06/26/2014   treated with Harvoni   Hip pain    History of echocardiogram    a. 04/2019 Echo: EF >65%, nl RV fxn.   History of MRSA infection 2013   HLD (hyperlipidemia)    Hypertension    a. 06/2021 Renal Duplex: ? bilat RAS; b. 06/2021 CTA Abd: No signif RAS.   Hyponatremia    Hypothyroidism    Incisional hernia 11/08/2012   Knee pain    Left carotid bruit    Morbid obesity (HCC)    Multinodular thyroid    OSA (obstructive sleep apnea)    OSA on CPAP    Osteoporosis    Palpitations    Post-traumatic osteoarthritis of right knee 09/24/2015   Recurrent ventral hernia 11/08/2012   Status post total right knee replacement using cement 05/10/2016   Stroke (HCC) 07/05/2022   right arm,torso and right leg numbness   Tobacco use disorder    Vitamin B12 deficiency    Vitamin D deficiency disease     Past Surgical History:  Procedure Laterality Date   BILATERAL SALPINGOOPHORECTOMY     due to abnormal mass   BREAST BIOPSY Left    neg   BREAST SURGERY Left 20 yrs ago   CESAREAN SECTION  COLONOSCOPY     COLONOSCOPY WITH PROPOFOL N/A 10/16/2017   Procedure: COLONOSCOPY WITH PROPOFOL;  Surgeon: Wyline Mood, MD;  Location: [facility] ENDOSCOPY;  Service: Gastroenterology;  Laterality: N/A;   COLONOSCOPY WITH PROPOFOL N/A 02/16/2021   Procedure: COLONOSCOPY WITH PROPOFOL;  Surgeon: Wyline Mood, MD;  Location[facility] ENDOSCOPY;  Service: Gastroenterology;  Laterality: N/A;   FORAMINOTOMY 1 LEVEL Right 10/10/2022   Procedure: RIGHT L4-5 LAMINOFORAMINOTOMY;  Surgeon: Venetia Night, MD;  Location: [facility] ORS;  Service: Neurosurgery;  Laterality: Right;   HERNIA REPAIR  06/2011, July 2014   Ventral wall repair with Physiomesh   HERNIA REPAIR     2nd.vental wall repair   JOINT REPLACEMENT Right    knee   LUMBAR LAMINECTOMY/ DECOMPRESSION WITH MET-RX N/A 10/12/2022   Procedure: LUMBAR LAMINECTOMY/ DECOMPRESSION WITH MET-RX;  Surgeon: Venetia Night, MD;   Location: [facility] ORS;  Service: Neurosurgery;  Laterality: N/A;   TONSILLECTOMY     TOTAL HIP ARTHROPLASTY Left 07/23/2019   Procedure: TOTAL HIP ARTHROPLASTY;  Surgeon: Christena Flake, MD;  Location: [facility] ORS;  Service: Orthopedics;  Laterality: Left;   TOTAL KNEE ARTHROPLASTY Right 05/10/2016   Procedure: TOTAL KNEE ARTHROPLASTY;  Surgeon: Christena Flake, MD;  Location: [facility] ORS;  Service: Orthopedics;  Laterality: Right;   TUBAL LIGATION      Prior to Admission medications   Medication Sig Start Date End Date Taking? Authorizing Provider  acetaminophen (TYLENOL) 500 MG tablet Take 1,000 mg by mouth 2 (two) times daily.    [provider]  albuterol (VENTOLIN HFA) 108 (90 Base) MCG/ACT inhaler Inhale 2 puffs into the lungs every 6 (six) hours as needed for wheezing or shortness of breath. 06/02/22   Coralyn Helling, MD  amLODipine (NORVASC) 5 MG tablet Take 1 tablet (5 mg total) by mouth daily. Patient not taking: Reported on 11/02/2022 09/28/22   Glori Luis, MD  carvedilol (COREG) 12.5 MG tablet Take 12.5 mg by mouth 2 (two) times daily. 10/19/22   [provider]  celecoxib (CELEBREX) 100 MG capsule TAKE 1 CAPSULE(100 MG) BY MOUTH TWICE DAILY 11/07/22   [name] Borders, PA  clotrimazole (LOTRIMIN) 1 % cream Apply 1 application topically 2 (two) times daily as needed. Patient not taking: Reported on 11/02/2022    [provider]  cyclobenzaprine (FLEXERIL) 5 MG tablet Take 5 mg by mouth 2 (two) times daily. 10/24/22   [provider]  DULoxetine (CYMBALTA) 20 MG capsule TAKE 1 CAPSULE BY MOUTH EVERY DAY WITH 30 MG CAPSULE 08/12/22   Jomarie Longs, MD  DULoxetine (CYMBALTA) 30 MG capsule Take 1 capsule (30 mg total) by mouth daily. Take daily with 20 mg cap. 08/12/22   Jomarie Longs, MD  gabapentin (NEURONTIN) 300 MG capsule Take 600 mg by mouth 2 (two) times daily.     [provider]  HYDROcodone-acetaminophen (NORCO/VICODIN) 5-325 MG tablet Take 1  tablet by mouth daily as needed for moderate pain.    [provider]  naloxone [facility]) nasal spray 4 mg/0.1 mL Place 0.4 mg into the nose once. 12/27/21   [provider]  pantoprazole (PROTONIX) 40 MG tablet TAKE 1 TABLET(40 MG) BY MOUTH DAILY 08/18/22   Glori Luis, MD  rosuvastatin (CRESTOR) 20 MG tablet Take 1 tablet (20 mg total) by mouth daily. 07/04/22   Eulis Foster, FNP  Semaglutide, 1 MG/DOSE, (OZEMPIC, 1 MG/DOSE,) 4 MG/3ML SOPN INJECT 1MG  UNDER THE SKIN (SUBCUTANEOUSLY) ONCE WEEKLY ON SUNDAY 10/14/22   Glori Luis,  MD  senna (SENOKOT) 8.6 MG TABS tablet Take 1 tablet (8.6 mg total) by mouth 2 (two) times daily. Patient not taking: Reported on 11/02/2022 10/11/22   [name] Borders, PA  spironolactone (ALDACTONE) 25 MG tablet Take 1 tablet (25 mg total) by mouth daily. 09/07/22   Glori Luis, MD  telmisartan (MICARDIS) 80 MG tablet TAKE ONE TABLET BY MOUTH DAILY 10/14/22   Glori Luis, MD  traZODone (DESYREL) 50 MG tablet TAKE 1 TABLET(50 MG) BY MOUTH AT BEDTIME 11/01/22   Darcel Smalling, MD  Vitamin D, Ergocalciferol, (DRISDOL) 1.25 MG (50000 UNIT) CAPS capsule NEW PRESCRIPTION REQUEST: TAKE ONE CAPSULE BY MOUTH EVERY WEEK Patient taking differently: 50,000 Units every 7 (seven) days. Sundays 07/25/22   Glori Luis, MD    Family History  Problem Relation Age of Onset   Arthritis Mother    Asthma Mother    Mental illness Mother    Thyroid disease Mother    COPD Mother    Heart disease Mother    Congestive Heart Failure Mother    Alcohol abuse Mother    Eating disorder Mother    Bipolar disorder Mother    Alcohol abuse Father    Heart attack Father    Alcohol abuse Sister    Drug abuse Sister    Mental illness Sister    Mental illness Sister    Fibromyalgia Sister    Obesity Sister    Pneumonia Sister    Depression Sister    Mental illness Sister    Alcohol abuse Sister    Drug abuse Sister    Arthritis Brother    Mental  illness Brother    Cancer Brother        non-hodkins lymphoma   Drug abuse Daughter    Drug abuse Son    Alcohol abuse Son    Drug abuse Son    Alcohol abuse Son    Diabetes Neg Hx    Stroke Neg Hx    Breast cancer Neg Hx    Thyroid cancer Neg Hx      Social History   Tobacco Use   Smoking status: Every Day    Packs/day: 0.50    Years: 41.00    Additional pack years: 0.00    Total pack years: 20.50    Types: Cigarettes   Smokeless tobacco: Never  Vaping Use   Vaping Use: Never used  Substance Use Topics   Alcohol use: No    Alcohol/week: 0.0 standard drinks of 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   Drug use: No    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    Allergies as of 11/15/2022 - Review Complete 11/15/2022  Allergen Reaction Noted   Wellbutrin [bupropion]  03/18/2020   Lasix [furosemide] Other (See Comments) 11/08/2012    Review of Systems:    All systems reviewed and negative except where noted in HPI.   Physical Exam:  BP 113/66   Pulse 64   Temp 98.9 F (37.2 C) (Oral)   Ht 5\' 2"  (1.575 m)   Wt 209 lb 9.6 oz (95.1 kg)   BMI 38.34 kg/m  No LMP recorded. Patient is postmenopausal. Psych:  Alert and cooperative. Normal mood and affect. General:   Alert,  Well-developed, well-nourished, pleasant and cooperative in NAD Head:  Normocephalic and atraumatic. Eyes:  Sclera clear, no icterus.   Conjunctiva pink.    Neurologic:  Alert and oriented x3;  grossly  normal neurologically. Psych:  Alert and cooperative. Normal mood and affect.  Imaging Studies: CT Angio Chest Pulmonary Embolism (PE) W or WO Contrast  Result Date: 10/17/2022 CLINICAL DATA:  Hypoxia. Five days status post L3-5 decompression and epidural hematoma evacuation. Clinical concern for PE. EXAM: CT ANGIOGRAPHY CHEST WITH CONTRAST TECHNIQUE: Multidetector CT imaging of the chest was performed using the standard protocol during bolus administration of intravenous contrast.  Multiplanar CT image reconstructions and MIPs were obtained to evaluate the vascular anatomy. RADIATION DOSE REDUCTION: This exam was performed according to the departmental dose-optimization program which includes automated exposure control, adjustment of the mA and/or kV according to patient size and/or use of iterative reconstruction technique. CONTRAST:  75mL OMNIPAQUE IOHEXOL 350 MG/ML SOLN COMPARISON:  04/30/2021 FINDINGS: Cardiovascular: Normally opacified pulmonary arteries with no pulmonary arterial filling defects seen. Enlarged heart. Small amount of air in the anterior aspect of the pulmonary valve leaflets compatible with air introduced with intravenous access. Normally opacified pulmonary arteries with no pulmonary arterial filling defects seen. 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 This was evaluated with thyroid ultrasound on 08/10/2022 and does not need follow-up. No enlarged lymph nodes. Unremarkable esophagus. Lungs/Pleura: Lungs are clear. No pleural effusion or pneumothorax. Upper Abdomen: Minimal atheromatous arterial calcification. Mm mid right renal calculus. Small right renal cyst. This does not need imaging follow-up. Musculoskeletal: Thoracic and lower cervical spine degenerative changes and mild scoliosis. 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 Review of the MIP images confirms the above findings. IMPRESSION: 1. No pulmonary emboli or other acute abnormality. 2. Cardiomegaly. 3. Right nephrolithiasis. Electronically Signed   By: Beckie Salts M.D.   On: 10/17/2022 12:56    Assessment and Plan:   Susan [name] is a 62 y.o. y/o female has been referred for dysphagia.  Per history she has absolutely no issues with swallowing her main complaint is constipation.  Plan 1.  Avoid artificial sugars and sweeteners as it can cause osmotic diarrhea.  Commence on samples of Trulance which will be provided for a week and she has been advised  that after a week if it helps to give a call we will give her a prescription.  If it does not help to give [name] a call we will give her alternative medications.  She is willing to undergo repeat colonoscopy in September.  I have discussed alternative options, risks & benefits,  which include, but are not limited to, bleeding, infection, perforation,respiratory complication & drug reaction.  The patient agrees with this plan & written consent will be obtained.    Follow up in as needed  Dr Wyline Mood MD,MRCP(U.K)

## 2022-11-16 ENCOUNTER — Telehealth: Payer: Self-pay

## 2022-11-16 ENCOUNTER — Other Ambulatory Visit: Payer: Self-pay | Admitting: Family Medicine

## 2022-11-16 DIAGNOSIS — J449 Chronic obstructive pulmonary disease, unspecified: Secondary | ICD-10-CM | POA: Diagnosis not present

## 2022-11-16 DIAGNOSIS — N179 Acute kidney failure, unspecified: Secondary | ICD-10-CM

## 2022-11-16 DIAGNOSIS — J9621 Acute and chronic respiratory failure with hypoxia: Secondary | ICD-10-CM | POA: Diagnosis not present

## 2022-11-16 DIAGNOSIS — G834 Cauda equina syndrome: Secondary | ICD-10-CM | POA: Diagnosis not present

## 2022-11-16 NOTE — Telephone Encounter (Signed)
Left message to call the office back regarding: She should try to drink 2 bottles of water daily. Her labs should be rechecked in one week. Please schedule a BMP lab for around 11/23/22.

## 2022-11-16 NOTE — Telephone Encounter (Signed)
-----   Message from Glori Luis, MD sent at 11/16/2022 11:43 AM EDT ----- Noted. She should try to drink 2 bottles of water daily. Her labs should be rechecked in one week. Orders placed.

## 2022-11-17 NOTE — Telephone Encounter (Signed)
Patient called yesterday and someone else handled this message. See other message.

## 2022-11-17 NOTE — Telephone Encounter (Signed)
Pt called back and I read the message to her and she has a 3 month appointment on 7/15

## 2022-11-17 NOTE — Telephone Encounter (Signed)
Noted  

## 2022-11-21 ENCOUNTER — Ambulatory Visit (INDEPENDENT_AMBULATORY_CARE_PROVIDER_SITE_OTHER): Payer: 59 | Admitting: Family Medicine

## 2022-11-21 ENCOUNTER — Encounter: Payer: Self-pay | Admitting: Family Medicine

## 2022-11-21 VITALS — BP 122/76 | HR 66 | Temp 97.9°F | Ht 62.0 in | Wt 211.0 lb

## 2022-11-21 DIAGNOSIS — I10 Essential (primary) hypertension: Secondary | ICD-10-CM

## 2022-11-21 DIAGNOSIS — F3289 Other specified depressive episodes: Secondary | ICD-10-CM

## 2022-11-21 DIAGNOSIS — S064XAA Epidural hemorrhage with loss of consciousness status unknown, initial encounter: Secondary | ICD-10-CM

## 2022-11-21 DIAGNOSIS — R197 Diarrhea, unspecified: Secondary | ICD-10-CM

## 2022-11-21 MED ORDER — ROSUVASTATIN CALCIUM 20 MG PO TABS: 20.0000 mg | ORAL_TABLET | Freq: Every day | ORAL | 3 refills | Status: DC

## 2022-11-21 NOTE — Assessment & Plan Note (Signed)
Continues to have issues with this.  Likely related to artificial sugar intake based on GI note.  She will continue to reduce that.  Will check BMP.  I encouraged adequate water intake.  Discussed reduction in sugary beverages.

## 2022-11-21 NOTE — Assessment & Plan Note (Signed)
Doing generally well status post surgery for this issue.  She will continue PT and continue to follow with neurosurgery.

## 2022-11-21 NOTE — Assessment & Plan Note (Signed)
Chronic issue.  Patient will continue Cymbalta 50 mg daily.  She will follow-up with psychiatry as planned.  She will check with them on seeing a therapist.  If for some reason the psychiatrist cannot get her into a therapist she will let us know.

## 2022-11-21 NOTE — Assessment & Plan Note (Signed)
Chronic issue.  Blood pressure well-controlled at home.  Patient will continue amlodipine 5 mg daily, carvedilol 12.5 mg twice daily, telmisartan 80 mg daily, and spironolactone 25 mg daily.

## 2022-11-21 NOTE — Progress Notes (Signed)
Susan Alar, MD Phone: [phone]  Susan [name] is a 62 y.o. female who presents today for f/u.  HYPERTENSION Disease Monitoring Home BP Monitoring 120/70, 99/63 Chest pain- no    Dyspnea- no Medications Compliance-  taking amlodipine, coreg, spironolactone, telmisartan  Edema- notes some in her right ankle yesterday though this resolved.  BMET    Component Value Date/Time   NA 130 (L) 11/14/2022 0925   NA 137 05/28/2020 0903   NA 136 04/29/2014 1133   K 4.8 11/14/2022 0925   K 4.2 04/29/2014 1133   CL 96 11/14/2022 0925   CL 103 04/29/2014 1133   CO2 24 11/14/2022 0925   CO2 30 04/29/2014 1133   GLUCOSE 82 11/14/2022 0925   GLUCOSE 85 04/29/2014 1133   BUN 16 11/14/2022 0925   BUN 9 05/28/2020 0903   BUN 9 04/29/2014 1133   CREATININE 1.34 (H) 11/14/2022 0925   CREATININE 0.92 10/16/2019 1139   CALCIUM 9.5 11/14/2022 0925   CALCIUM 9.1 04/29/2014 1133   GFRNONAA 42 (L) 11/05/2022 0836   GFRNONAA 69 10/16/2019 1139   GFRAA 64 05/28/2020 0903   GFRAA 80 10/16/2019 1139   Diarrhea: Patient saw GI notes they advised her that it may be related to her artificial sugar intake.  She is working on cutting down on this and is hopeful that this will help.  Depression: Patient notes some depression related to having her back surgery not go the way she thought it would.  She is on Cymbalta and see psychiatry later this month.  She wants to see a therapist.  No SI.  Lumbar laminoforaminotomy: Patient had complications after this that resulted in L3-4 lumbar decompression for evacuation of epidural hematoma.  Her numbness improved in her legs throughout her hospital stay though her weakness was not back to baseline at discharge.  She has been doing physical therapy at home.  She is walking with a walker.  She follows up with neurosurgery in the near future.  Social History   Tobacco Use  Smoking Status Every Day   Current packs/day: 0.50   Average packs/day: 0.5 packs/day  for 41.0 years (20.5 ttl pk-yrs)   Types: Cigarettes  Smokeless Tobacco Never    Current Outpatient Medications on File Prior to Visit  Medication Sig Dispense Refill   acetaminophen (TYLENOL) 500 MG tablet Take 1,000 mg by mouth 2 (two) times daily.     albuterol (VENTOLIN HFA) 108 (90 Base) MCG/ACT inhaler Inhale 2 puffs into the lungs every 6 (six) hours as needed for wheezing or shortness of breath. 8 g 2   carvedilol (COREG) 12.5 MG tablet Take 12.5 mg by mouth 2 (two) times daily.     celecoxib (CELEBREX) 100 MG capsule TAKE 1 CAPSULE(100 MG) BY MOUTH TWICE DAILY 60 capsule 0   clotrimazole (LOTRIMIN) 1 % cream Apply 1 application  topically 2 (two) times daily as needed.     cyclobenzaprine (FLEXERIL) 5 MG tablet Take 5 mg by mouth 2 (two) times daily.     DULoxetine (CYMBALTA) 20 MG capsule TAKE ONE CAPSULE BY MOUTH DAILY WITH 30 MG 30 capsule 3   DULoxetine (CYMBALTA) 30 MG capsule Take 1 capsule (30 mg total) by mouth daily. Take daily with 20 mg cap. 30 capsule 3   gabapentin (NEURONTIN) 300 MG capsule Take 600 mg by mouth 2 (two) times daily.      HYDROcodone-acetaminophen (NORCO/VICODIN) 5-325 MG tablet Take 1 tablet by mouth daily as needed for moderate pain.  naloxone (NARCAN) nasal spray 4 mg/0.1 mL Place 0.4 mg into the nose once.     pantoprazole (PROTONIX) 40 MG tablet TAKE 1 TABLET(40 MG) BY MOUTH DAILY 90 tablet 3   Semaglutide, 1 MG/DOSE, (OZEMPIC, 1 MG/DOSE,) 4 MG/3ML SOPN INJECT 1MG  UNDER THE SKIN (SUBCUTANEOUSLY) ONCE WEEKLY ON SUNDAY 3 mL 1   spironolactone (ALDACTONE) 25 MG tablet Take 1 tablet (25 mg total) by mouth daily. 90 tablet 1   telmisartan (MICARDIS) 80 MG tablet TAKE ONE TABLET BY MOUTH DAILY 90 tablet 1   traZODone (DESYREL) 50 MG tablet TAKE 1 TABLET(50 MG) BY MOUTH AT BEDTIME 30 tablet 1   No current facility-administered medications on file prior to visit.     ROS see history of present illness  Objective  Physical Exam Vitals:   11/21/22  1428 11/21/22 1432  BP: 132/76 122/76  Pulse: 66   Temp: 97.9 F (36.6 C)   SpO2: 99%     BP Readings from Last 3 Encounters:  11/21/22 122/76  11/15/22 113/66  11/05/22 (!) 92/53   Wt Readings from Last 3 Encounters:  11/21/22 211 lb (95.7 kg)  11/15/22 209 lb 9.6 oz (95.1 kg)  11/05/22 207 lb 14.3 oz (94.3 kg)    Physical Exam Constitutional:      General: She is not in acute distress.    Appearance: She is not diaphoretic.  Cardiovascular:     Rate and Rhythm: Normal rate and regular rhythm.     Heart sounds: Normal heart sounds.  Pulmonary:     Effort: Pulmonary effort is normal.     Breath sounds: Normal breath sounds.  Musculoskeletal:       Back:  Skin:    General: Skin is warm and dry.  Neurological:     Mental Status: She is alert.      Assessment/Plan: Please see individual problem list.  Primary hypertension Assessment & Plan: Chronic issue.  Blood pressure well-controlled at home.  Patient will continue amlodipine 5 mg daily, carvedilol 12.5 mg twice daily, telmisartan 80 mg daily, and spironolactone 25 mg daily.   Diarrhea of presumed infectious origin Assessment & Plan: Continues to have issues with this.  Likely related to artificial sugar intake based on GI note.  She will continue to reduce that.  Will check BMP.  I encouraged adequate water intake.  Discussed reduction in sugary beverages.  Orders: -     Basic metabolic panel  Epidural hematoma (HCC) Assessment & Plan: Doing generally well status post surgery for this issue.  She will continue PT and continue to follow with neurosurgery.   Other depression Assessment & Plan: Chronic issue.  Patient will continue Cymbalta 50 mg daily.  She will follow-up with psychiatry as planned.  She will check with them on seeing a therapist.  If for some reason the psychiatrist cannot get her into a therapist she will let us know.   Other orders -     Rosuvastatin Calcium; Take 1 tablet (20 mg  total) by mouth daily.  Dispense: 90 tablet; Refill: 3    Return in about 3 months (around 02/21/2023).   Susan Alar, MD [facility]

## 2022-11-22 ENCOUNTER — Ambulatory Visit (INDEPENDENT_AMBULATORY_CARE_PROVIDER_SITE_OTHER): Payer: 59 | Admitting: Neurosurgery

## 2022-11-22 ENCOUNTER — Encounter: Payer: Self-pay | Admitting: Neurosurgery

## 2022-11-22 ENCOUNTER — Telehealth: Payer: Self-pay | Admitting: Gastroenterology

## 2022-11-22 VITALS — BP 120/65 | HR 69 | Temp 98.2°F | Wt 211.8 lb

## 2022-11-22 DIAGNOSIS — G834 Cauda equina syndrome: Secondary | ICD-10-CM

## 2022-11-22 DIAGNOSIS — Z09 Encounter for follow-up examination after completed treatment for conditions other than malignant neoplasm: Secondary | ICD-10-CM

## 2022-11-22 DIAGNOSIS — S064XAD Epidural hemorrhage with loss of consciousness status unknown, subsequent encounter: Secondary | ICD-10-CM

## 2022-11-22 DIAGNOSIS — Z9889 Other specified postprocedural states: Secondary | ICD-10-CM

## 2022-11-22 LAB — BASIC METABOLIC PANEL
BUN: 14 mg/dL (ref 6–23)
CO2: 21 mEq/L (ref 19–32)
Calcium: 9.7 mg/dL (ref 8.4–10.5)
Chloride: 100 mEq/L (ref 96–112)
Creatinine, Ser: 1.51 mg/dL — ABNORMAL HIGH (ref 0.40–1.20)
GFR: 37 mL/min — ABNORMAL LOW (ref >60.00)
Glucose, Bld: 81 mg/dL (ref 70–99)
Potassium: 4.9 mEq/L (ref 3.5–5.1)
Sodium: 129 mEq/L — ABNORMAL LOW (ref 135–145)

## 2022-11-22 NOTE — Telephone Encounter (Signed)
PT started taking linzess on 11/18/22 and has had severe diarrhea everyday since she would like a call back to be advised on what she should do next

## 2022-11-22 NOTE — Progress Notes (Addendum)
   REFERRING PHYSICIAN:  No referring provider defined for this encounter.   DOS: 10/12/22 L3-5 lumbar decompression with evacuation of epidural hematoma  DOS:   10/10/22 L3-5 lumbar decompression with evacuation of epidural hematoma   HISTORY OF PRESENT ILLNESS: Susan [name] is status post L3-5 lumbar decompression with evacuation of epidural hematoma.   She has had some improvements, but still has some difficulty with dribbling urine and has had some accidents secondary to diarrhea.  She has difficulty with sensation of her private area.  She has been both constipated and had diarrhea since surgery.  She is currently suffering from diarrhea.  She does see a gastroenterologist for this.  Her right lower extremity strength has improved, but is not back to baseline.   PHYSICAL EXAMINATION:  General: Patient is well developed, well nourished, calm, collected, and in no apparent distress.   NEUROLOGICAL:  General: In no acute distress.   Awake, alert, oriented to person, place, and time.  Pupils equal round and reactive to light.  Facial tone is symmetric.     Strength:            Side Iliopsoas Quads Hamstring PF DF EHL  R 5 5 5  4+ 4 4  L 5 5 5 5 5 5    Incision shows evidence of superficial dehiscence.  There is some scabbing.  It does not appear infected.   ROS (Neurologic):  Negative except as noted above  IMAGING: Nothing new to review.   ASSESSMENT/PLAN:  Susan [name] is improving after lumbar decompression.  Unfortunately, she suffered an epidural hematoma requiring return to the operating room for evacuation.  She has been left with cauda equina syndrome which is slowly improving, but does result in some issues with bowel bladder control.  I recommended that we refer her for outpatient physical therapy, and will refer her to physical medicine and rehabilitation to help manage her symptoms.  Will see her back in 6 weeks.     Venetia Night MD Department of  neurosurgery

## 2022-11-23 DIAGNOSIS — I10 Essential (primary) hypertension: Secondary | ICD-10-CM | POA: Diagnosis not present

## 2022-11-23 DIAGNOSIS — J9601 Acute respiratory failure with hypoxia: Secondary | ICD-10-CM | POA: Diagnosis not present

## 2022-11-23 DIAGNOSIS — I69351 Hemiplegia and hemiparesis following cerebral infarction affecting right dominant side: Secondary | ICD-10-CM | POA: Diagnosis not present

## 2022-11-23 DIAGNOSIS — Z604 Social exclusion and rejection: Secondary | ICD-10-CM | POA: Diagnosis not present

## 2022-11-23 DIAGNOSIS — D649 Anemia, unspecified: Secondary | ICD-10-CM | POA: Diagnosis not present

## 2022-11-23 DIAGNOSIS — G834 Cauda equina syndrome: Secondary | ICD-10-CM | POA: Diagnosis not present

## 2022-11-23 DIAGNOSIS — J449 Chronic obstructive pulmonary disease, unspecified: Secondary | ICD-10-CM | POA: Diagnosis not present

## 2022-11-23 DIAGNOSIS — E785 Hyperlipidemia, unspecified: Secondary | ICD-10-CM | POA: Diagnosis not present

## 2022-11-23 DIAGNOSIS — R339 Retention of urine, unspecified: Secondary | ICD-10-CM | POA: Diagnosis not present

## 2022-11-23 DIAGNOSIS — Z4789 Encounter for other orthopedic aftercare: Secondary | ICD-10-CM | POA: Diagnosis not present

## 2022-11-23 DIAGNOSIS — M5416 Radiculopathy, lumbar region: Secondary | ICD-10-CM | POA: Diagnosis not present

## 2022-11-23 DIAGNOSIS — G4733 Obstructive sleep apnea (adult) (pediatric): Secondary | ICD-10-CM | POA: Diagnosis not present

## 2022-11-23 DIAGNOSIS — Z7985 Long-term (current) use of injectable non-insulin antidiabetic drugs: Secondary | ICD-10-CM | POA: Diagnosis not present

## 2022-11-23 DIAGNOSIS — Z791 Long term (current) use of non-steroidal anti-inflammatories (NSAID): Secondary | ICD-10-CM | POA: Diagnosis not present

## 2022-11-23 DIAGNOSIS — Z9981 Dependence on supplemental oxygen: Secondary | ICD-10-CM | POA: Diagnosis not present

## 2022-11-23 DIAGNOSIS — Z7951 Long term (current) use of inhaled steroids: Secondary | ICD-10-CM | POA: Diagnosis not present

## 2022-11-23 DIAGNOSIS — E119 Type 2 diabetes mellitus without complications: Secondary | ICD-10-CM | POA: Diagnosis not present

## 2022-11-23 NOTE — Telephone Encounter (Signed)
Called patient back and left her a detailed message asking her how many times a day she has a bowel movement and what was the dosage that Dr. Tobi Bastos put her her on the Linzess. I let her know that she needed to call me back to help answer her questions.

## 2022-11-24 ENCOUNTER — Encounter: Payer: Self-pay | Admitting: Physical Medicine and Rehabilitation

## 2022-11-28 NOTE — Telephone Encounter (Signed)
Patient states she was taking the Linzess but stopped the medication on 11/20/2022 because when she took the medication she would have diarrhea all day long and would have no warning on when the diarrhea was happening. She would wear a diaper because she could not make it to the bathroom and she used desitin diaper rash cream because her butt was raw. Since stopping the medication she will have the diarrhea now like every other day. She states she had diarrhea 2 times this morning. She states she has no warning when it is coming on and from where she had a stoke she can not hold it in. She states she does not noticed anything that makes it worse. She states she is having a lot of sulfa burps. She wants to know what is recommend for the diarrhea because she states she can not live like this

## 2022-11-29 ENCOUNTER — Telehealth (INDEPENDENT_AMBULATORY_CARE_PROVIDER_SITE_OTHER): Payer: 59 | Admitting: Psychiatry

## 2022-11-29 ENCOUNTER — Encounter: Payer: Self-pay | Admitting: Psychiatry

## 2022-11-29 ENCOUNTER — Other Ambulatory Visit: Payer: Self-pay

## 2022-11-29 DIAGNOSIS — F4310 Post-traumatic stress disorder, unspecified: Secondary | ICD-10-CM | POA: Diagnosis not present

## 2022-11-29 DIAGNOSIS — F3131 Bipolar disorder, current episode depressed, mild: Secondary | ICD-10-CM

## 2022-11-29 DIAGNOSIS — F1021 Alcohol dependence, in remission: Secondary | ICD-10-CM | POA: Diagnosis not present

## 2022-11-29 DIAGNOSIS — F17210 Nicotine dependence, cigarettes, uncomplicated: Secondary | ICD-10-CM

## 2022-11-29 DIAGNOSIS — F3132 Bipolar disorder, current episode depressed, moderate: Secondary | ICD-10-CM | POA: Insufficient documentation

## 2022-11-29 DIAGNOSIS — R197 Diarrhea, unspecified: Secondary | ICD-10-CM

## 2022-11-29 DIAGNOSIS — F17200 Nicotine dependence, unspecified, uncomplicated: Secondary | ICD-10-CM

## 2022-11-29 DIAGNOSIS — F1421 Cocaine dependence, in remission: Secondary | ICD-10-CM

## 2022-11-29 MED ORDER — LURASIDONE HCL 20 MG PO TABS
20.0000 mg | ORAL_TABLET | Freq: Every day | ORAL | 1 refills | Status: DC
Start: 2022-11-29 — End: 2023-01-05

## 2022-11-29 NOTE — Patient Instructions (Signed)
  Medicaid below :  [facility] [address] [place], [place] [address]  [phone]    [employer] [address] [place], [place] [address]  [phone]    [facility] [address] [place], [place] [address]  [phone]

## 2022-11-29 NOTE — Telephone Encounter (Signed)
We can stop linzess and start on miralax 1-2 capfuls a day

## 2022-11-29 NOTE — Telephone Encounter (Signed)
Patient verbalized understanding of instructions  

## 2022-11-29 NOTE — Progress Notes (Unsigned)
Virtual Visit via Video Note  I connected with Susan [name] on 11/29/22 at  9:00 AM EDT by a video enabled telemedicine application and verified that I am speaking with the correct person using two identifiers.  Location Provider Location : [facility] Patient Location : Home  Participants: Patient , Provider    I discussed the limitations of evaluation and management by telemedicine and the availability of in person appointments. The patient expressed understanding and agreed to proceed.   I discussed the assessment and treatment plan with the patient. The patient was provided an opportunity to ask questions and all were answered. The patient agreed with the plan and demonstrated an understanding of the instructions.   The patient was advised to call back or seek an in-person evaluation if the symptoms worsen or if the condition fails to improve as anticipated. [facility] MD OP Progress Note  11/29/2022 9:33 AM SANVIKA CUTTINO  MRN:  [id number]  Chief Complaint:  Chief Complaint  Patient presents with   Follow-up   Depression   Anxiety   Medication Refill   HPI: Susan [name] is a 62 year old Caucasian female, widowed, employed, lives in [place], has a history of bipolar disorder, alcohol and cocaine use disorder in remission, tobacco use disorder, PTSD, bereavement, degenerative disc disease, obstructive sleep apnea on CPAP, history of total hip replacement, osteoarthritis, hepatitis C was evaluated by telemedicine today.  Patient had surgery of her back-L3-L5-10/10/2022 however developed complications from it and was hospitalized for a week.  Patient reports she is currently back at home.  I have reviewed notes per Dr. Marcell Barlow.  Patient reports good support system from her son and daughter.  She reports she currently ambulates with a cane.  She does not drive at this point.  She reports she also has GI complications, bowel incontinence, diarrhea, has to wear diapers.  She reports  because of that she has not been sleeping well at night.  She reports all of these medical issues has been affecting her mood and she has been feeling depressed.  She had struggles with mild sadness, lack of motivation, anhedonia, low energy, sleep problems as noted above.  Patient is currently compliant on medications as prescribed.  Denies side effects.  Patient denies any suicidality, homicidality or perceptual disturbances.  Patient agreeable to restart Latuda.  She is currently compliant on the duloxetine.  Patient also agreeable to start psychotherapy, patient provided resources.  Patient looks forward to taking a trip to [place] with a friend in August.  She has been trying to cut back on smoking.  Patient denies any other concerns today.  Visit Diagnosis:    ICD-10-CM   1. Bipolar 1 disorder, depressed, mild (HCC)  F31.31 lurasidone (LATUDA) 20 MG TABS tablet    2. PTSD (post-traumatic stress disorder)  F43.10     3. Tobacco use disorder  F17.200     4. Alcohol use disorder, severe, in sustained remission (HCC)  F10.21     5. Cocaine use disorder, moderate, in sustained remission (HCC)  F14.21       Past Psychiatric History: I have reviewed past psychiatric history from progress note on 09/27/2019.  Past trials of Seroquel, Cymbalta, Abilify, risperidone.  Past Medical History:  Past Medical History:  Diagnosis Date   ADD (attention deficit disorder)    Alcohol abuse    Anemia    Anxiety    Aortic atherosclerosis (HCC) 02/20/2018   Chest CT Sept 2019   Arthritis    rheumatoid  arthritis   Asthma    Bipolar disorder (HCC)    Centrilobular emphysema (HCC)    Cocaine use disorder, moderate, in sustained remission (HCC)    Constipation    Degenerative disc disease at L5-S1 level 09/28/2016   See ortho note May 2018   Depression    bipolar, hx of suicide attempt   Diabetes mellitus, type 2 (HCC)    Diverticulitis 2013   DOE (dyspnea on exertion)    Drug use     Family history of adverse reaction to anesthesia    mom-delayed emergence   GERD (gastroesophageal reflux disease)    H/O suicide attempt    slit wrists   Hepatitis C 06/26/2014   treated with Harvoni   Hip pain    History of echocardiogram    a. 04/2019 Echo: EF >65%, nl RV fxn.   History of MRSA infection 2013   HLD (hyperlipidemia)    Hypertension    a. 06/2021 Renal Duplex: ? bilat RAS; b. 06/2021 CTA Abd: No signif RAS.   Hyponatremia    Hypothyroidism    Incisional hernia 11/08/2012   Knee pain    Left carotid bruit    Morbid obesity (HCC)    Multinodular thyroid    OSA (obstructive sleep apnea)    OSA on CPAP    Osteoporosis    Palpitations    Post-traumatic osteoarthritis of right knee 09/24/2015   Recurrent ventral hernia 11/08/2012   Status post total right knee replacement using cement 05/10/2016   Stroke (HCC) 07/05/2022   right arm,torso and right leg numbness   Tobacco use disorder    Vitamin B12 deficiency    Vitamin D deficiency disease     Past Surgical History:  Procedure Laterality Date   BILATERAL SALPINGOOPHORECTOMY     due to abnormal mass   BREAST BIOPSY Left    neg   BREAST SURGERY Left 20 yrs ago   CESAREAN SECTION     COLONOSCOPY     COLONOSCOPY WITH PROPOFOL N/A 10/16/2017   Procedure: COLONOSCOPY WITH PROPOFOL;  Surgeon: Wyline Mood, MD;  Location: [facility] ENDOSCOPY;  Service: Gastroenterology;  Laterality: N/A;   COLONOSCOPY WITH PROPOFOL N/A 02/16/2021   Procedure: COLONOSCOPY WITH PROPOFOL;  Surgeon: Wyline Mood, MD;  Location: [facility] ENDOSCOPY;  Service: Gastroenterology;  Laterality: N/A;   FORAMINOTOMY 1 LEVEL Right 10/10/2022   Procedure: RIGHT L4-5 LAMINOFORAMINOTOMY;  Surgeon: Venetia Night, MD;  Location: [facility] ORS;  Service: Neurosurgery;  Laterality: Right;   HERNIA REPAIR  06/2011, July 2014   Ventral wall repair with Physiomesh   HERNIA REPAIR     2nd.vental wall repair   JOINT REPLACEMENT Right    knee   LUMBAR LAMINECTOMY/  DECOMPRESSION WITH MET-RX N/A 10/12/2022   Procedure: LUMBAR LAMINECTOMY/ DECOMPRESSION WITH MET-RX;  Surgeon: Venetia Night, MD;  Location: [facility] ORS;  Service: Neurosurgery;  Laterality: N/A;   TONSILLECTOMY     TOTAL HIP ARTHROPLASTY Left 07/23/2019   Procedure: TOTAL HIP ARTHROPLASTY;  Surgeon: Christena Flake, MD;  Location: [facility] ORS;  Service: Orthopedics;  Laterality: Left;   TOTAL KNEE ARTHROPLASTY Right 05/10/2016   Procedure: TOTAL KNEE ARTHROPLASTY;  Surgeon: Christena Flake, MD;  Location: [facility] ORS;  Service: Orthopedics;  Laterality: Right;   TUBAL LIGATION      Family Psychiatric History: I have reviewed family psychiatric history from progress note on 09/27/2019.  Family History:  Family History  Problem Relation Age of Onset   Arthritis Mother    Asthma Mother  Mental illness Mother    Thyroid disease Mother    COPD Mother    Heart disease Mother    Congestive Heart Failure Mother    Alcohol abuse Mother    Eating disorder Mother    Bipolar disorder Mother    Alcohol abuse Father    Heart attack Father    Alcohol abuse Sister    Drug abuse Sister    Mental illness Sister    Mental illness Sister    Fibromyalgia Sister    Obesity Sister    Pneumonia Sister    Depression Sister    Mental illness Sister    Alcohol abuse Sister    Drug abuse Sister    Arthritis Brother    Mental illness Brother    Cancer Brother        non-hodkins lymphoma   Drug abuse Daughter    Drug abuse Son    Alcohol abuse Son    Drug abuse Son    Alcohol abuse Son    Diabetes Neg Hx    Stroke Neg Hx    Breast cancer Neg Hx    Thyroid cancer Neg Hx     Social History: I have reviewed social history from my progress note on 09/27/2019. Social History   Socioeconomic History   Marital status: Widowed    Spouse name: Not on file   Number of children: 3   Years of education: Not on file   Highest education level: Some college, no degree  Occupational History   Occupation:  disabled  Tobacco Use   Smoking status: Every Day    Current packs/day: 0.50    Average packs/day: 0.5 packs/day for 41.0 years (20.5 ttl pk-yrs)    Types: Cigarettes   Smokeless tobacco: Never  Vaping Use   Vaping status: Never Used  Substance and Sexual Activity   Alcohol use: No    Alcohol/week: 0.0 standard drinks of 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   Drug use: No    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   Sexual activity: Not Currently  Other Topics Concern   Not on file  Social History Narrative   Not on file   Social Determinants of Health   Financial Resource Strain: Low Risk  (06/02/2021)   Overall Financial Resource Strain (CARDIA)    Difficulty of Paying Living Expenses: Not hard at all  Food Insecurity: No Food Insecurity (10/24/2022)   Hunger Vital Sign    Worried About Running Out of Food in the Last Year: Never true    Ran Out of Food in the Last Year: Never true  Transportation Needs: No Transportation Needs (10/24/2022)   PRAPARE - Administrator, Civil Service (Medical): No    Lack of Transportation (Non-Medical): No  Physical Activity: Sufficiently Active (06/02/2021)   Exercise Vital Sign    Days of Exercise per Week: 5 days    Minutes of Exercise per Session: 30 min  Stress: No Stress Concern Present (06/02/2021)   [employer] of Occupational Health - Occupational Stress Questionnaire    Feeling of Stress : Not at all  Social Connections: Moderately Isolated (06/02/2021)   Social Connection and Isolation Panel [NHANES]    Frequency of Communication with Friends and Family: More than three times a week    Frequency of Social Gatherings with Friends and Family: Three times a week    Attends Religious Services: Never    Active Member of Clubs  or Organizations: Yes    Attends Banker Meetings: More than 4 times per year    Marital Status: Widowed    Allergies:  Allergies  Allergen Reactions    Wellbutrin [Bupropion]     Patient reports made " deathly sick " years ago at appointment 03/18/20.   Lasix [Furosemide] Other (See Comments)    Electrolyte imbalance    Metabolic Disorder Labs: Lab Results  Component Value Date   HGBA1C 5.9 06/28/2022   MPG 108 04/08/2019   MPG 103 08/16/2017   No results found for: "PROLACTIN" Lab Results  Component Value Date   CHOL 211 (H) 07/06/2022   TRIG 166 (H) 07/06/2022   HDL 50 07/06/2022   CHOLHDL 4.2 07/06/2022   VLDL 33 07/06/2022   LDLCALC 128 (H) 07/06/2022   LDLCALC 108 (H) 06/28/2022   Lab Results  Component Value Date   TSH 1.15 04/28/2021   TSH 1.09 10/16/2019    Therapeutic Level Labs: No results found for: "LITHIUM" No results found for: "VALPROATE" No results found for: "CBMZ"  Current Medications: Current Outpatient Medications  Medication Sig Dispense Refill   acetaminophen (TYLENOL) 500 MG tablet Take 1,000 mg by mouth 2 (two) times daily.     albuterol (VENTOLIN HFA) 108 (90 Base) MCG/ACT inhaler Inhale 2 puffs into the lungs every 6 (six) hours as needed for wheezing or shortness of breath. 8 g 2   carvedilol (COREG) 12.5 MG tablet Take 12.5 mg by mouth 2 (two) times daily.     celecoxib (CELEBREX) 100 MG capsule TAKE 1 CAPSULE(100 MG) BY MOUTH TWICE DAILY 60 capsule 0   clotrimazole (LOTRIMIN) 1 % cream Apply 1 application  topically 2 (two) times daily as needed.     cyclobenzaprine (FLEXERIL) 5 MG tablet Take 5 mg by mouth 2 (two) times daily.     DULoxetine (CYMBALTA) 20 MG capsule TAKE ONE CAPSULE BY MOUTH DAILY WITH 30 MG 30 capsule 3   DULoxetine (CYMBALTA) 30 MG capsule Take 1 capsule (30 mg total) by mouth daily. Take daily with 20 mg cap. 30 capsule 3   gabapentin (NEURONTIN) 300 MG capsule Take 600 mg by mouth 2 (two) times daily.      HYDROcodone-acetaminophen (NORCO/VICODIN) 5-325 MG tablet Take 1 tablet by mouth daily as needed for moderate pain.     lurasidone (LATUDA) 20 MG TABS tablet Take 1  tablet (20 mg total) by mouth daily with breakfast. 30 tablet 1   rosuvastatin (CRESTOR) 20 MG tablet Take 1 tablet (20 mg total) by mouth daily. 90 tablet 3   Semaglutide, 1 MG/DOSE, (OZEMPIC, 1 MG/DOSE,) 4 MG/3ML SOPN INJECT 1MG  UNDER THE SKIN (SUBCUTANEOUSLY) ONCE WEEKLY ON SUNDAY 3 mL 1   spironolactone (ALDACTONE) 25 MG tablet Take 1 tablet (25 mg total) by mouth daily. 90 tablet 1   telmisartan (MICARDIS) 80 MG tablet TAKE ONE TABLET BY MOUTH DAILY 90 tablet 1   traZODone (DESYREL) 50 MG tablet TAKE 1 TABLET(50 MG) BY MOUTH AT BEDTIME 30 tablet 1   linaclotide (LINZESS) 145 MCG CAPS capsule Take 145 mcg by mouth daily before breakfast. (Patient not taking: Reported on 11/29/2022)     naloxone (NARCAN) nasal spray 4 mg/0.1 mL Place 0.4 mg into the nose once. (Patient not taking: Reported on 11/29/2022)     pantoprazole (PROTONIX) 40 MG tablet TAKE 1 TABLET(40 MG) BY MOUTH DAILY (Patient not taking: Reported on 11/29/2022) 90 tablet 3   No current facility-administered medications for this visit.  Musculoskeletal: Strength & Muscle Tone:  UTA Gait & Station:  Seated Patient leans: N/A  Psychiatric Specialty Exam: Review of Systems  Gastrointestinal:  Positive for diarrhea.  Musculoskeletal:  Positive for back pain.       Status post back surgery-L3-L5  Psychiatric/Behavioral:  Positive for dysphoric mood and sleep disturbance.     There were no vitals taken for this visit.There is no height or weight on file to calculate BMI.  General Appearance: Fairly Groomed  Eye Contact:  Fair  Speech:  Clear and Coherent  Volume:  Normal  Mood:  Depressed  Affect:  Congruent  Thought Process:  Goal Directed and Descriptions of Associations: Intact  Orientation:  Full (Time, Place, and Person)  Thought Content: Logical   Suicidal Thoughts:  No  Homicidal Thoughts:  No  Memory:  Immediate;   Fair Recent;   Fair Remote;   Fair  Judgement:  Fair  Insight:  Fair  Psychomotor Activity:   Normal  Concentration:  Concentration: Fair and Attention Span: Fair  Recall:  [employer] of Knowledge: Fair  Language: Fair  Akathisia:  No  Handed:  Right  AIMS (if indicated): not done  Assets:  Communication Skills Desire for Improvement Housing Social Support  ADL's:  Intact  Cognition: WNL  Sleep:  Poor   Screenings: AIMS    Flowsheet Row Video Visit from 03/24/2022 in [facility] Video Visit from 01/20/2022 in [facility] Video Visit from 10/21/2021 in [facility]  AIMS Total Score 0 0 0      GAD-7    Flowsheet Row Office Visit from 11/21/2022 in [facility] [employer] Visit from 09/28/2022 in [facility] [employer] Visit from 08/31/2022 in [facility] Office Visit from 06/28/2022 in [facility] Video Visit from 01/20/2022 in [facility]  Total GAD-7 Score 2 4 4 3 1       PHQ2-9    Flowsheet Row Video Visit from 11/29/2022 in [facility] Office Visit from 11/21/2022 in [facility] Visit from 09/28/2022 in [facility] [employer] Visit from 08/31/2022 in [facility] Office Visit from 06/28/2022 in [facility] Total Score 2 2 1 3 1   PHQ-9 Total Score 8 6 3 13 11       Flowsheet Row Video Visit from 11/29/2022 in [facility] ED from 11/05/2022 in [facility] at [facility] ED to Hosp-Admission (Discharged) from 10/12/2022 in [facility] (1A)  C-SSRS RISK CATEGORY Low  Risk No Risk No Risk        Assessment and Plan: Susan [name] is a 62 year old Caucasian female who has a history of bipolar disorder, GAD generated's disease, recent CVA, multiple medical problems including recent back surgery-L3-L5 with complications, presents with worsening mood symptoms, sleep problems due to her current pain and other GI problems, complications from back surgery, will benefit from the following plan.  Plan Bipolar disorder type I depressed mild-unstable Start Latuda 20 mg p.o. daily with breakfast Cymbalta 50 mg p.o. daily Gabapentin as prescribed for pain which is also a mood stabilizer.  PTSD-stable Continue Cymbalta 50 mg p.o. daily  Tobacco use  disorder-improving Patient provided counseling for 1 minute.  Patient provided resources.  Alcohol use disorder/cocaine use disorder in remission Patient has been sober since the past 5 years.  Continue AA meetings.  Patient provided resources for CBT-patient encouraged to establish care.  Collaboration of Care: Collaboration of Care: Other I have reviewed notes per Dr.Yarborough -neurosurgery-dated 11/22/2022-patient status post lumbar spine surgery for decompression of spinal cord-status post epidural hematoma, status post evacuation, cauda equina syndrome which is slowly improving however continues to have bowel or bladder control problems.  Patient has been referred for outpatient physical therapy.'  Patient/Guardian was advised Release of Information must be obtained prior to any record release in order to collaborate their care with an outside provider. Patient/Guardian was advised if they have not already done so to contact the registration department to sign all necessary forms in order for [name] to release information regarding their care.   Consent: Patient/Guardian gives verbal consent for treatment and assignment of benefits for services provided during this visit. Patient/Guardian expressed understanding and  agreed to proceed.   Follow-up in clinic in 1 -2 months or sooner if needed.  This note was generated in part or whole with voice recognition software. Voice recognition is usually quite accurate but there are transcription errors that can and very often do occur. I apologize for any typographical errors that were not detected and corrected.    Jomarie Longs, MD 11/30/2022, 9:09 AM

## 2022-11-30 ENCOUNTER — Ambulatory Visit: Payer: No Typology Code available for payment source | Admitting: Psychiatry

## 2022-11-30 DIAGNOSIS — Z4789 Encounter for other orthopedic aftercare: Secondary | ICD-10-CM | POA: Diagnosis not present

## 2022-11-30 DIAGNOSIS — Z7951 Long term (current) use of inhaled steroids: Secondary | ICD-10-CM | POA: Diagnosis not present

## 2022-11-30 DIAGNOSIS — E785 Hyperlipidemia, unspecified: Secondary | ICD-10-CM | POA: Diagnosis not present

## 2022-11-30 DIAGNOSIS — G4733 Obstructive sleep apnea (adult) (pediatric): Secondary | ICD-10-CM | POA: Diagnosis not present

## 2022-11-30 DIAGNOSIS — I69351 Hemiplegia and hemiparesis following cerebral infarction affecting right dominant side: Secondary | ICD-10-CM | POA: Diagnosis not present

## 2022-11-30 DIAGNOSIS — J449 Chronic obstructive pulmonary disease, unspecified: Secondary | ICD-10-CM | POA: Diagnosis not present

## 2022-11-30 DIAGNOSIS — M5416 Radiculopathy, lumbar region: Secondary | ICD-10-CM | POA: Diagnosis not present

## 2022-11-30 DIAGNOSIS — Z9981 Dependence on supplemental oxygen: Secondary | ICD-10-CM | POA: Diagnosis not present

## 2022-11-30 DIAGNOSIS — E119 Type 2 diabetes mellitus without complications: Secondary | ICD-10-CM | POA: Diagnosis not present

## 2022-11-30 DIAGNOSIS — Z604 Social exclusion and rejection: Secondary | ICD-10-CM | POA: Diagnosis not present

## 2022-11-30 DIAGNOSIS — Z791 Long term (current) use of non-steroidal anti-inflammatories (NSAID): Secondary | ICD-10-CM | POA: Diagnosis not present

## 2022-11-30 DIAGNOSIS — G834 Cauda equina syndrome: Secondary | ICD-10-CM | POA: Diagnosis not present

## 2022-11-30 DIAGNOSIS — I10 Essential (primary) hypertension: Secondary | ICD-10-CM | POA: Diagnosis not present

## 2022-11-30 DIAGNOSIS — R339 Retention of urine, unspecified: Secondary | ICD-10-CM | POA: Diagnosis not present

## 2022-11-30 DIAGNOSIS — D649 Anemia, unspecified: Secondary | ICD-10-CM | POA: Diagnosis not present

## 2022-11-30 DIAGNOSIS — Z7985 Long-term (current) use of injectable non-insulin antidiabetic drugs: Secondary | ICD-10-CM | POA: Diagnosis not present

## 2022-11-30 DIAGNOSIS — J9601 Acute respiratory failure with hypoxia: Secondary | ICD-10-CM | POA: Diagnosis not present

## 2022-12-02 ENCOUNTER — Encounter: Payer: Self-pay | Admitting: Nurse Practitioner

## 2022-12-02 ENCOUNTER — Other Ambulatory Visit: Payer: Self-pay

## 2022-12-02 ENCOUNTER — Other Ambulatory Visit: Payer: 59

## 2022-12-02 ENCOUNTER — Ambulatory Visit (INDEPENDENT_AMBULATORY_CARE_PROVIDER_SITE_OTHER): Payer: 59 | Admitting: Nurse Practitioner

## 2022-12-02 VITALS — BP 108/60 | HR 75 | Temp 97.7°F | Resp 16 | Ht 62.0 in | Wt 210.1 lb

## 2022-12-02 DIAGNOSIS — K529 Noninfective gastroenteritis and colitis, unspecified: Secondary | ICD-10-CM | POA: Diagnosis not present

## 2022-12-02 DIAGNOSIS — R197 Diarrhea, unspecified: Secondary | ICD-10-CM

## 2022-12-02 LAB — BASIC METABOLIC PANEL
BUN: 18 mg/dL (ref 6–23)
CO2: 24 mEq/L (ref 19–32)
Calcium: 9.2 mg/dL (ref 8.4–10.5)
Chloride: 94 mEq/L — ABNORMAL LOW (ref 96–112)
Creatinine, Ser: 1.45 mg/dL — ABNORMAL HIGH (ref 0.40–1.20)
GFR: 38.84 mL/min — ABNORMAL LOW (ref >60.00)
Glucose, Bld: 83 mg/dL (ref 70–99)
Potassium: 4.8 mEq/L (ref 3.5–5.1)
Sodium: 127 mEq/L — ABNORMAL LOW (ref 135–145)

## 2022-12-02 NOTE — Patient Instructions (Signed)
Diarrhea, Adult Diarrhea is frequent loose and sometimes watery bowel movements. Diarrhea can make you feel weak and cause you to become dehydrated. Dehydration is a condition in which there is not enough water or other fluids in the body. Dehydration can make you tired and thirsty, cause you to have a dry mouth, and decrease how often you urinate. Diarrhea typically lasts 2-3 days. However, it can last longer if it is a sign of something more serious. It is important to treat your diarrhea as told by your health care provider. Follow these instructions at home: Eating and drinking     Follow these recommendations as told by your health care provider: Take an oral rehydration solution (ORS). This is an over-the-counter medicine that helps return your body to its normal balance of nutrients and water. It is found at pharmacies and retail stores. Drink enough fluid to keep your urine pale yellow. Drink fluids such as water, diluted fruit juice, and low-calorie sports drinks. You can drink milk also, if desired. Sucking on ice chips is another way to get fluids. Avoid drinking fluids that contain a lot of sugar or caffeine, such as soda, energy drinks, and regular sports drinks. Avoid alcohol. Eat bland, easy-to-digest foods in small amounts as you are able. These foods include bananas, applesauce, rice, lean meats, toast, and crackers. Avoid spicy or fatty foods.  Medicines Take over-the-counter and prescription medicines only as told by your health care provider. If you were prescribed antibiotics, take them as told by your health care provider. Do not stop using the antibiotic even if you start to feel better. General instructions  Wash your hands often using soap and water for at least 20 seconds. If soap and water are not available, use hand sanitizer. Others in the household should wash their hands as well. Hands should be washed: After using the toilet or changing a diaper. Before  preparing, cooking, or serving food. While caring for a sick person or while visiting someone in a hospital. Rest at home while you recover. Take a warm bath to relieve any burning or pain from frequent diarrhea episodes. Watch your condition for any changes. Contact a health care provider if: You have a fever. Your diarrhea gets worse. You have new symptoms. You vomit every time you eat or drink. You feel light-headed, dizzy, or have a headache. You have muscle cramps. You have signs of dehydration, such as: Dark urine, very little urine, or no urine. Cracked lips. Dry mouth. Sunken eyes. Sleepiness. Weakness. You have bloody or black stools or stools that look like tar. You have severe pain, cramping, or bloating in your abdomen. Your skin feels cold and clammy. You feel confused. Get help right away if: You have chest pain or your heart is beating very quickly. You have trouble breathing or you are breathing very quickly. You feel extremely weak or you faint. These symptoms may be an emergency. Get help right away. Call 911. Do not wait to see if the symptoms will go away. Do not drive yourself to the hospital. This information is not intended to replace advice given to you by your health care provider. Make sure you discuss any questions you have with your health care provider. Document Revised: 10/12/2021 Document Reviewed: 10/12/2021 Elsevier Patient Education  2024 [employer].

## 2022-12-02 NOTE — Progress Notes (Signed)
Established Patient Office Visit  Subjective:  Patient ID: Susan [name], female    DOB: [birth year]  Age: 62 y.o. MRN: [id number]  CC:  Chief Complaint  Patient presents with   Diarrhea    Since March every other day all day long.    HPI  Susan [name] presents for diarrhea that started Tuesday and ended Wednesday. She is eating and drinking okay.   She states eat processed food, potatoes chips, ice cream and drink mostly sweet tea.   She has no diarrhea today and denies abdominal pain, cramping.  She had imodium last night. No dirrheea since then HPI   Past Medical History:  Diagnosis Date   ADD (attention deficit disorder)    Alcohol abuse    Anemia    Anxiety    Aortic atherosclerosis (HCC) 02/20/2018   Chest CT Sept 2019   Arthritis    rheumatoid arthritis   Asthma    Bipolar disorder (HCC)    Centrilobular emphysema (HCC)    Cocaine use disorder, moderate, in sustained remission (HCC)    Constipation    Degenerative disc disease at L5-S1 level 09/28/2016   See ortho note May 2018   Depression    bipolar, hx of suicide attempt   Diabetes mellitus, type 2 (HCC)    Diverticulitis 2013   DOE (dyspnea on exertion)    Drug use    Family history of adverse reaction to anesthesia    mom-delayed emergence   GERD (gastroesophageal reflux disease)    H/O suicide attempt    slit wrists   Hepatitis C 06/26/2014   treated with Harvoni   Hip pain    History of echocardiogram    a. 04/2019 Echo: EF >65%, nl RV fxn.   History of MRSA infection 2013   HLD (hyperlipidemia)    Hypertension    a. 06/2021 Renal Duplex: ? bilat RAS; b. 06/2021 CTA Abd: No signif RAS.   Hyponatremia    Hypothyroidism    Incisional hernia 11/08/2012   Knee pain    Left carotid bruit    Morbid obesity (HCC)    Multinodular thyroid    OSA (obstructive sleep apnea)    OSA on CPAP    Osteoporosis    Palpitations    Post-traumatic osteoarthritis of right knee 09/24/2015    Recurrent ventral hernia 11/08/2012   Status post total right knee replacement using cement 05/10/2016   Stroke (HCC) 07/05/2022   right arm,torso and right leg numbness   Tobacco use disorder    Vitamin B12 deficiency    Vitamin D deficiency disease     Past Surgical History:  Procedure Laterality Date   BILATERAL SALPINGOOPHORECTOMY     due to abnormal mass   BREAST BIOPSY Left    neg   BREAST SURGERY Left 20 yrs ago   CESAREAN SECTION     COLONOSCOPY     COLONOSCOPY WITH PROPOFOL N/A 10/16/2017   Procedure: COLONOSCOPY WITH PROPOFOL;  Surgeon: Wyline Mood, MD;  Location: [facility] ENDOSCOPY;  Service: Gastroenterology;  Laterality: N/A;   COLONOSCOPY WITH PROPOFOL N/A 02/16/2021   Procedure: COLONOSCOPY WITH PROPOFOL;  Surgeon: Wyline Mood, MD;  Location: [facility] ENDOSCOPY;  Service: Gastroenterology;  Laterality: N/A;   FORAMINOTOMY 1 LEVEL Right 10/10/2022   Procedure: RIGHT L4-5 LAMINOFORAMINOTOMY;  Surgeon: Venetia Night, MD;  Location: [facility] ORS;  Service: Neurosurgery;  Laterality: Right;   HERNIA REPAIR  06/2011, July 2014   Ventral wall repair with Physiomesh   HERNIA  REPAIR     2nd.vental wall repair   JOINT REPLACEMENT Right    knee   LUMBAR LAMINECTOMY/ DECOMPRESSION WITH MET-RX N/A 10/12/2022   Procedure: LUMBAR LAMINECTOMY/ DECOMPRESSION WITH MET-RX;  Surgeon: Venetia Night, MD;  Location: [facility] ORS;  Service: Neurosurgery;  Laterality: N/A;   TONSILLECTOMY     TOTAL HIP ARTHROPLASTY Left 07/23/2019   Procedure: TOTAL HIP ARTHROPLASTY;  Surgeon: Christena Flake, MD;  Location: [facility] ORS;  Service: Orthopedics;  Laterality: Left;   TOTAL KNEE ARTHROPLASTY Right 05/10/2016   Procedure: TOTAL KNEE ARTHROPLASTY;  Surgeon: Christena Flake, MD;  Location: [facility] ORS;  Service: Orthopedics;  Laterality: Right;   TUBAL LIGATION      Family History  Problem Relation Age of Onset   Arthritis Mother    Asthma Mother    Mental illness Mother    Thyroid disease Mother    COPD Mother     Heart disease Mother    Congestive Heart Failure Mother    Alcohol abuse Mother    Eating disorder Mother    Bipolar disorder Mother    Alcohol abuse Father    Heart attack Father    Alcohol abuse Sister    Drug abuse Sister    Mental illness Sister    Mental illness Sister    Fibromyalgia Sister    Obesity Sister    Pneumonia Sister    Depression Sister    Mental illness Sister    Alcohol abuse Sister    Drug abuse Sister    Arthritis Brother    Mental illness Brother    Cancer Brother        non-hodkins lymphoma   Drug abuse Daughter    Drug abuse Son    Alcohol abuse Son    Drug abuse Son    Alcohol abuse Son    Diabetes Neg Hx    Stroke Neg Hx    Breast cancer Neg Hx    Thyroid cancer Neg Hx     Social History   Socioeconomic History   Marital status: Widowed    Spouse name: Not on file   Number of children: 3   Years of education: Not on file   Highest education level: Some college, no degree  Occupational History   Occupation: disabled  Tobacco Use   Smoking status: Every Day    Current packs/day: 0.50    Average packs/day: 0.5 packs/day for 41.0 years (20.5 ttl pk-yrs)    Types: Cigarettes   Smokeless tobacco: Never  Vaping Use   Vaping status: Never Used  Substance and Sexual Activity   Alcohol use: No    Alcohol/week: 0.0 standard drinks of 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   Drug use: No    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   Sexual activity: Not Currently  Other Topics Concern   Not on file  Social History Narrative   Not on file   Social Determinants of Health   Financial Resource Strain: Low Risk  (06/02/2021)   Overall Financial Resource Strain (CARDIA)    Difficulty of Paying Living Expenses: Not hard at all  Food Insecurity: No Food Insecurity (10/24/2022)   Hunger Vital Sign    Worried About Running Out of Food in the Last Year: Never true    Ran Out of Food in the Last Year: Never true   Transportation Needs: No Transportation Needs (10/24/2022)   PRAPARE - Transportation    Lack of  Transportation (Medical): No    Lack of Transportation (Non-Medical): No  Physical Activity: Sufficiently Active (06/02/2021)   Exercise Vital Sign    Days of Exercise per Week: 5 days    Minutes of Exercise per Session: 30 min  Stress: No Stress Concern Present (06/02/2021)   [employer] of Occupational Health - Occupational Stress Questionnaire    Feeling of Stress : Not at all  Social Connections: Moderately Isolated (06/02/2021)   Social Connection and Isolation Panel [NHANES]    Frequency of Communication with Friends and Family: More than three times a week    Frequency of Social Gatherings with Friends and Family: Three times a week    Attends Religious Services: Never    Active Member of Clubs or Organizations: Yes    Attends Banker Meetings: More than 4 times per year    Marital Status: Widowed  Intimate Partner Violence: Not At Risk (10/12/2022)   Humiliation, Afraid, Rape, and Kick questionnaire    Fear of Current or Ex-Partner: No    Emotionally Abused: No    Physically Abused: No    Sexually Abused: No     Outpatient Medications Prior to Visit  Medication Sig Dispense Refill   acetaminophen (TYLENOL) 500 MG tablet Take 1,000 mg by mouth 2 (two) times daily.     albuterol (VENTOLIN HFA) 108 (90 Base) MCG/ACT inhaler Inhale 2 puffs into the lungs every 6 (six) hours as needed for wheezing or shortness of breath. 8 g 2   carvedilol (COREG) 12.5 MG tablet Take 12.5 mg by mouth 2 (two) times daily.     clotrimazole (LOTRIMIN) 1 % cream Apply 1 application  topically 2 (two) times daily as needed.     cyclobenzaprine (FLEXERIL) 5 MG tablet Take 5 mg by mouth 2 (two) times daily.     DULoxetine (CYMBALTA) 20 MG capsule TAKE ONE CAPSULE BY MOUTH DAILY WITH 30 MG 30 capsule 3   DULoxetine (CYMBALTA) 30 MG capsule Take 1 capsule (30 mg total) by mouth daily. Take  daily with 20 mg cap. 30 capsule 3   gabapentin (NEURONTIN) 300 MG capsule Take 600 mg by mouth 2 (two) times daily.      HYDROcodone-acetaminophen (NORCO/VICODIN) 5-325 MG tablet Take 1 tablet by mouth daily as needed for moderate pain.     linaclotide (LINZESS) 145 MCG CAPS capsule Take 145 mcg by mouth daily before breakfast.     lurasidone (LATUDA) 20 MG TABS tablet Take 1 tablet (20 mg total) by mouth daily with breakfast. 30 tablet 1   naloxone (NARCAN) nasal spray 4 mg/0.1 mL Place 0.4 mg into the nose once.     pantoprazole (PROTONIX) 40 MG tablet TAKE 1 TABLET(40 MG) BY MOUTH DAILY 90 tablet 3   rosuvastatin (CRESTOR) 20 MG tablet Take 1 tablet (20 mg total) by mouth daily. 90 tablet 3   spironolactone (ALDACTONE) 25 MG tablet Take 1 tablet (25 mg total) by mouth daily. 90 tablet 1   telmisartan (MICARDIS) 80 MG tablet TAKE ONE TABLET BY MOUTH DAILY 90 tablet 1   traZODone (DESYREL) 50 MG tablet TAKE 1 TABLET(50 MG) BY MOUTH AT BEDTIME 30 tablet 1   celecoxib (CELEBREX) 100 MG capsule TAKE 1 CAPSULE(100 MG) BY MOUTH TWICE DAILY 60 capsule 0   Semaglutide, 1 MG/DOSE, (OZEMPIC, 1 MG/DOSE,) 4 MG/3ML SOPN INJECT 1MG  UNDER THE SKIN (SUBCUTANEOUSLY) ONCE WEEKLY ON SUNDAY 3 mL 1   No facility-administered medications prior to visit.    Allergies  Allergen Reactions   Wellbutrin [Bupropion]     Patient reports made " deathly sick " years ago at appointment 03/18/20.   Lasix [Furosemide] Other (See Comments)    Electrolyte imbalance    ROS Review of Systems Negative unless indicated in HPI.    Objective:    Physical Exam Constitutional:      Appearance: Normal appearance.  Cardiovascular:     Rate and Rhythm: Normal rate and regular rhythm.     Pulses: Normal pulses.     Heart sounds: Normal heart sounds.  Abdominal:     General: Bowel sounds are normal.     Palpations: Abdomen is soft.     Tenderness: There is no abdominal tenderness. There is no right CVA tenderness or  left CVA tenderness.  Musculoskeletal:     Cervical back: Normal range of motion.  Skin:    General: Skin is warm.  Neurological:     General: No focal deficit present.     Mental Status: She is alert. Mental status is at baseline.  Psychiatric:        Mood and Affect: Mood normal.        Behavior: Behavior normal.        Thought Content: Thought content normal.        Judgment: Judgment normal.     BP 108/60   Pulse 75   Temp 97.7 F (36.5 C)   Resp 16   Ht 5\' 2"  (1.575 m)   Wt 210 lb 2 oz (95.3 kg)   SpO2 99%   BMI 38.43 kg/m  Wt Readings from Last 3 Encounters:  12/02/22 210 lb 2 oz (95.3 kg)  11/22/22 211 lb 12.8 oz (96.1 kg)  11/21/22 211 lb (95.7 kg)     Health Maintenance  Topic Date Due   OPHTHALMOLOGY EXAM  Never done   Zoster Vaccines- Shingrix (1 of 2) Never done   COVID-19 Vaccine (4 - 2023-24 season) 01/07/2022   Colonoscopy  02/16/2022   DTaP/Tdap/Td (2 - Td or Tdap) 03/09/2022   PAP SMEAR-Modifier  03/24/2022   Medicare Annual Wellness (AWV)  06/02/2022   MAMMOGRAM  06/09/2022   INFLUENZA VACCINE  12/08/2022   HEMOGLOBIN A1C  12/27/2022   Diabetic kidney evaluation - Urine ACR  06/29/2023   FOOT EXAM  06/29/2023   Lung Cancer Screening  10/17/2023   Diabetic kidney evaluation - eGFR measurement  12/02/2023   Hepatitis C Screening  Completed   HIV Screening  Completed   HPV VACCINES  Aged Out    There are no preventive care reminders to display for this patient.  Lab Results  Component Value Date   TSH 1.15 04/28/2021   Lab Results  Component Value Date   WBC 9.5 11/05/2022   HGB 11.3 (L) 11/05/2022   HCT 33.5 (L) 11/05/2022   MCV 95.4 11/05/2022   PLT 217 11/05/2022   Lab Results  Component Value Date   NA 127 (L) 12/02/2022   K 4.8 12/02/2022   CO2 24 12/02/2022   GLUCOSE 83 12/02/2022   BUN 18 12/02/2022   CREATININE 1.45 (H) 12/02/2022   BILITOT 0.6 11/02/2022   ALKPHOS 86 11/02/2022   AST 14 11/02/2022   ALT 9  11/02/2022   PROT 7.2 11/02/2022   ALBUMIN 4.3 11/02/2022   CALCIUM 9.2 12/02/2022   ANIONGAP 7 11/05/2022   GFR 38.84 (L) 12/02/2022   Lab Results  Component Value Date   CHOL 211 (H) 07/06/2022   Lab  Results  Component Value Date   HDL 50 07/06/2022   Lab Results  Component Value Date   LDLCALC 128 (H) 07/06/2022   Lab Results  Component Value Date   TRIG 166 (H) 07/06/2022   Lab Results  Component Value Date   CHOLHDL 4.2 07/06/2022   Lab Results  Component Value Date   HGBA1C 5.9 06/28/2022      Assessment & Plan:  Chronic diarrhea Assessment & Plan: PE reassuring. Advised to consume BRAT diet and adequate hydration. Avoid to consume food containing artificial sweetener. Will check BMP   Orders: -     Basic metabolic panel    Follow-up: No follow-ups on file.   Kara Dies, NP

## 2022-12-06 DIAGNOSIS — E119 Type 2 diabetes mellitus without complications: Secondary | ICD-10-CM | POA: Diagnosis not present

## 2022-12-06 DIAGNOSIS — I10 Essential (primary) hypertension: Secondary | ICD-10-CM | POA: Diagnosis not present

## 2022-12-06 DIAGNOSIS — Z9981 Dependence on supplemental oxygen: Secondary | ICD-10-CM | POA: Diagnosis not present

## 2022-12-06 DIAGNOSIS — Z791 Long term (current) use of non-steroidal anti-inflammatories (NSAID): Secondary | ICD-10-CM | POA: Diagnosis not present

## 2022-12-06 DIAGNOSIS — Z7951 Long term (current) use of inhaled steroids: Secondary | ICD-10-CM | POA: Diagnosis not present

## 2022-12-06 DIAGNOSIS — Z4789 Encounter for other orthopedic aftercare: Secondary | ICD-10-CM | POA: Diagnosis not present

## 2022-12-06 DIAGNOSIS — G834 Cauda equina syndrome: Secondary | ICD-10-CM | POA: Diagnosis not present

## 2022-12-06 DIAGNOSIS — Z604 Social exclusion and rejection: Secondary | ICD-10-CM | POA: Diagnosis not present

## 2022-12-06 DIAGNOSIS — J449 Chronic obstructive pulmonary disease, unspecified: Secondary | ICD-10-CM | POA: Diagnosis not present

## 2022-12-06 DIAGNOSIS — Z7985 Long-term (current) use of injectable non-insulin antidiabetic drugs: Secondary | ICD-10-CM | POA: Diagnosis not present

## 2022-12-06 DIAGNOSIS — I69351 Hemiplegia and hemiparesis following cerebral infarction affecting right dominant side: Secondary | ICD-10-CM | POA: Diagnosis not present

## 2022-12-06 DIAGNOSIS — D649 Anemia, unspecified: Secondary | ICD-10-CM | POA: Diagnosis not present

## 2022-12-06 DIAGNOSIS — G4733 Obstructive sleep apnea (adult) (pediatric): Secondary | ICD-10-CM | POA: Diagnosis not present

## 2022-12-06 DIAGNOSIS — E785 Hyperlipidemia, unspecified: Secondary | ICD-10-CM | POA: Diagnosis not present

## 2022-12-06 DIAGNOSIS — M5416 Radiculopathy, lumbar region: Secondary | ICD-10-CM | POA: Diagnosis not present

## 2022-12-06 DIAGNOSIS — J9601 Acute respiratory failure with hypoxia: Secondary | ICD-10-CM | POA: Diagnosis not present

## 2022-12-06 DIAGNOSIS — R339 Retention of urine, unspecified: Secondary | ICD-10-CM | POA: Diagnosis not present

## 2022-12-07 ENCOUNTER — Other Ambulatory Visit: Payer: Self-pay | Admitting: Family Medicine

## 2022-12-08 ENCOUNTER — Other Ambulatory Visit: Payer: Self-pay | Admitting: Neurosurgery

## 2022-12-08 DIAGNOSIS — Z9889 Other specified postprocedural states: Secondary | ICD-10-CM

## 2022-12-08 DIAGNOSIS — M5416 Radiculopathy, lumbar region: Secondary | ICD-10-CM

## 2022-12-08 NOTE — Telephone Encounter (Signed)
Please let her know that her celebrex was sent to her pharmacy.

## 2022-12-08 NOTE — Telephone Encounter (Signed)
Unable to leave message, voicemail not set up.

## 2022-12-13 ENCOUNTER — Ambulatory Visit: Payer: 59 | Attending: Neurosurgery

## 2022-12-13 DIAGNOSIS — Z9889 Other specified postprocedural states: Secondary | ICD-10-CM | POA: Insufficient documentation

## 2022-12-13 DIAGNOSIS — G834 Cauda equina syndrome: Secondary | ICD-10-CM | POA: Insufficient documentation

## 2022-12-13 DIAGNOSIS — R262 Difficulty in walking, not elsewhere classified: Secondary | ICD-10-CM | POA: Diagnosis not present

## 2022-12-13 DIAGNOSIS — M6281 Muscle weakness (generalized): Secondary | ICD-10-CM | POA: Insufficient documentation

## 2022-12-13 NOTE — Therapy (Signed)
OUTPATIENT PHYSICAL THERAPY NEURO EVALUATION   Patient Name: Susan [name] MRN: [id number] DOB:[birth year], 62 y.o., female Today's Date: 12/13/2022   PCP: [name] MD REFERRING PROVIDER: Venetia Night MD  END OF SESSION:  PT End of Session - 12/13/22 1055     Visit Number 1    Number of Visits 25    Date for PT Re-Evaluation 03/07/23    PT Start Time 0945    PT Stop Time 1035    PT Time Calculation (min) 50 min    Equipment Utilized During Treatment Gait belt    Activity Tolerance Patient tolerated treatment well    Behavior During Therapy [facility] for tasks assessed/performed             Past Medical History:  Diagnosis Date   ADD (attention deficit disorder)    Alcohol abuse    Anemia    Anxiety    Aortic atherosclerosis (HCC) 02/20/2018   Chest CT Sept 2019   Arthritis    rheumatoid arthritis   Asthma    Bipolar disorder (HCC)    Centrilobular emphysema (HCC)    Cocaine use disorder, moderate, in sustained remission (HCC)    Constipation    Degenerative disc disease at L5-S1 level 09/28/2016   See ortho note May 2018   Depression    bipolar, hx of suicide attempt   Diabetes mellitus, type 2 (HCC)    Diverticulitis 2013   DOE (dyspnea on exertion)    Drug use    Family history of adverse reaction to anesthesia    mom-delayed emergence   GERD (gastroesophageal reflux disease)    H/O suicide attempt    slit wrists   Hepatitis C 06/26/2014   treated with Harvoni   Hip pain    History of echocardiogram    a. 04/2019 Echo: EF >65%, nl RV fxn.   History of MRSA infection 2013   HLD (hyperlipidemia)    Hypertension    a. 06/2021 Renal Duplex: ? bilat RAS; b. 06/2021 CTA Abd: No signif RAS.   Hyponatremia    Hypothyroidism    Incisional hernia 11/08/2012   Knee pain    Left carotid bruit    Morbid obesity (HCC)    Multinodular thyroid    OSA (obstructive sleep apnea)    OSA on CPAP    Osteoporosis    Palpitations    Post-traumatic  osteoarthritis of right knee 09/24/2015   Recurrent ventral hernia 11/08/2012   Status post total right knee replacement using cement 05/10/2016   Stroke (HCC) 07/05/2022   right arm,torso and right leg numbness   Tobacco use disorder    Vitamin B12 deficiency    Vitamin D deficiency disease    Past Surgical History:  Procedure Laterality Date   BILATERAL SALPINGOOPHORECTOMY     due to abnormal mass   BREAST BIOPSY Left    neg   BREAST SURGERY Left 20 yrs ago   CESAREAN SECTION     COLONOSCOPY     COLONOSCOPY WITH PROPOFOL N/A 10/16/2017   Procedure: COLONOSCOPY WITH PROPOFOL;  Surgeon: Wyline Mood, MD;  Location: [facility] ENDOSCOPY;  Service: Gastroenterology;  Laterality: N/A;   COLONOSCOPY WITH PROPOFOL N/A 02/16/2021   Procedure: COLONOSCOPY WITH PROPOFOL;  Surgeon: Wyline Mood, MD;  Location: [facility] ENDOSCOPY;  Service: Gastroenterology;  Laterality: N/A;   FORAMINOTOMY 1 LEVEL Right 10/10/2022   Procedure: RIGHT L4-5 LAMINOFORAMINOTOMY;  Surgeon: Venetia Night, MD;  Location: [facility] ORS;  Service: Neurosurgery;  Laterality: Right;  HERNIA REPAIR  06/2011, July 2014   Ventral wall repair with Physiomesh   HERNIA REPAIR     2nd.vental wall repair   JOINT REPLACEMENT Right    knee   LUMBAR LAMINECTOMY/ DECOMPRESSION WITH MET-RX N/A 10/12/2022   Procedure: LUMBAR LAMINECTOMY/ DECOMPRESSION WITH MET-RX;  Surgeon: Venetia Night, MD;  Location: [facility] ORS;  Service: Neurosurgery;  Laterality: N/A;   TONSILLECTOMY     TOTAL HIP ARTHROPLASTY Left 07/23/2019   Procedure: TOTAL HIP ARTHROPLASTY;  Surgeon: Christena Flake, MD;  Location: [facility] ORS;  Service: Orthopedics;  Laterality: Left;   TOTAL KNEE ARTHROPLASTY Right 05/10/2016   Procedure: TOTAL KNEE ARTHROPLASTY;  Surgeon: Christena Flake, MD;  Location: [facility] ORS;  Service: Orthopedics;  Laterality: Right;   TUBAL LIGATION     Patient Active Problem List   Diagnosis Date Noted   Bipolar 1 disorder, depressed, moderate (HCC) 11/29/2022    Acute respiratory failure with hypoxia (HCC) 10/13/2022   AKI (acute kidney injury) (HCC) 10/13/2022   Anemia 10/13/2022   Cauda equina syndrome (HCC) 10/12/2022   Epidural hematoma (HCC) 10/12/2022   Mass in epidural space 10/12/2022   Synovial cyst of lumbar facet joint 10/10/2022   Lumbar radiculopathy 10/10/2022   S/P laminectomy 10/10/2022   Preop examination 09/28/2022   Epigastric pain 07/22/2022   Enlarged thyroid 07/22/2022   Diarrhea 07/22/2022   CVA (cerebrovascular accident) (HCC) 07/05/2022   Skin lesion 06/28/2022   BMI 38.0-38.9,adult 07/14/2021   Type 2 diabetes mellitus without complication, without long-term current use of insulin (HCC) 06/16/2021   H/O thyroid nodule 05/31/2021   History of hepatitis C- treated 2014- 2015 with [facility] gastroenterology and followed.  05/19/2021   Left carotid bruit 04/28/2021   Anxiety 05/28/2020   Bipolar affective disorder in remission (HCC) 05/28/2020   Bipolar disorder, in full remission, most recent episode mixed (HCC) 03/06/2020   Need for immunization against influenza 01/27/2020   Routine physical examination 01/27/2020   Morbid obesity (HCC) 01/27/2020   Osteoporosis 11/06/2019   Bipolar 1 disorder, mixed, mild (HCC) 10/29/2019   PTSD (post-traumatic stress disorder) 09/27/2019   Bereavement 09/27/2019   Cocaine use disorder, moderate, in sustained remission (HCC) 09/27/2019   Status post total hip replacement, left 07/23/2019   Primary osteoarthritis of left hip 06/14/2019   Depression 10/23/2018   Allergic rhinitis 08/23/2018   Aortic atherosclerosis (HCC) 02/20/2018   Centrilobular emphysema (HCC) 02/20/2018   Hyperlipidemia 03/28/2017   Degenerative disc disease at L5-S1 level 09/28/2016   Tobacco use disorder 04/21/2016   GERD (gastroesophageal reflux disease) 12/17/2015   Chronic back pain 10/09/2015   Post-traumatic osteoarthritis of right knee 09/24/2015   Alcohol use disorder, severe, in sustained remission  (HCC) 04/13/2015   Vitamin D deficiency 04/09/2015   Vitamin B12 deficiency 04/09/2015   OSA on CPAP 04/09/2015   Subclinical hyperthyroidism 06/26/2014   Goiter colloid, toxic, nodular 06/26/2014   Hypertension 06/26/2014   Tobacco use disorder, continuous 06/26/2014   Bipolar I disorder (HCC) 06/26/2014   Lumbar radiculitis 10/03/2013   Diverticulosis 08/24/2013    ONSET DATE: 10/12/22  REFERRING DIAG: W09.811 (ICD-10-CM) - Status post lumbar spine surgery for decompression of spinal cord G83.4 (ICD-10-CM) - Cauda equina syndrome (HCC)  THERAPY DIAG:  Muscle weakness (generalized)  Difficulty in walking, not elsewhere classified  Rationale for Evaluation and Treatment: Rehabilitation  SUBJECTIVE:  SUBJECTIVE STATEMENT: Katessa Vatalaro is a 62 y/o F with PMH: HTN, DM2, HLD, ETOH use, THA, PTSD, cocaine, bipolar, and L thalamic infarct (06/2022), s/p lumbar laminoforaminotomy (10/10/22). Presenting post L3-5 decompression with evacuation of epidural hematoma on 10/12/22 for Cauda Equina Syndrome. Pt has residual RLE deficits after her lumbar surgeries leading to ambulating with RW. She was independent prior to her surgeries now reporting balance and gait difficulties. Has had some Worsening RUE N/T since her CVA in February after her lumbar surgeries. Reports N/T 6-7/10 on severity. Mentions some falls when not using her RW. Feels like she always has to have a hand on something to steady herself.  Pt accompanied by: self  PERTINENT HISTORY:  Pt has residual RLE deficits after her lumbar surgeries leading to ambulating with RW. She was independent prior to her surgeries now reporting balance and gait difficulties. Has had some Worsening RUE N/T since her CVA in February after her lumbar surgeries. Reports N/T  6-7/10 on severity. Mentions some falls when not using her RW. Feels like she always has to have a hand on something to steady herself. Has had some falls anterior standing too close in the RW, a fall walking up the stairs with the RW. She is able to get herself up. Pt describing some unsafe scenarios with poor RW use with transfers and asc/desc curbs.   PAIN:  Are you having pain? No  PRECAUTIONS: Back  RED FLAGS: Bowel or bladder incontinence: Yes: MD aware since surgery.     WEIGHT BEARING RESTRICTIONS: No  FALLS: Has patient fallen in last 6 months? Yes. Number of falls 3  LIVING ENVIRONMENT: Lives with: lives with their son   PLOF: Independent  PATIENT GOALS: Improve her walking and   OBJECTIVE:   DIAGNOSTIC FINDINGS: CLINICAL DATA:  Elective surgery   EXAM: LUMBAR SPINE - 2-3 VIEW   COMPARISON:  10/10/2022   FINDINGS: Intraoperative fluoroscopy is utilized for surgical control purposes. Fluoroscopy time is recorded at 6 seconds. Dose 5.33 mGy. Two spot fluoroscopic images are obtained. Initial image demonstrates a localization marker posterior to the L4 vertebra. Second image demonstrates a localization marker posterior to the L3-4 interspace level.   IMPRESSION: Intraoperative fluoroscopy utilized for surgical control purposes for lumbar spine localization.     Electronically Signed   By: Burman Nieves M.D.   On: 10/12/2022 20:24  COGNITION: Overall cognitive status: Within functional limits for tasks assessed   SENSATION: Light touch: Impaired  RLE: impaired L2, L4-L5  COORDINATION: Heel to shin normal bilaterally   PROPRIOCEPTION: Normal bilat   POSTURE:  Heavy BUE support on RW with standing tasks with bilateral upper trap use   LUMBAR AROM: Flexion: 75% limited  Extension: 50% limited  Rotation R/L: 50% limited  Lateral flexion R/L: 50% limited  LOWER EXTREMITY AROM:  L ankle inversion: 20, eversion: 35  R ankle inversion: 15 ,  eversion: 25    LOWER EXTREMITY MMT:    MMT Right Eval Left Eval  Hip flexion 4- 4  Hip extension    Hip abduction    Hip adduction    Hip internal rotation    Hip external rotation    Knee flexion 4- 4+  Knee extension 4 4+  Ankle dorsiflexion 4- 4+  Ankle plantarflexion    Ankle inversion 3 4+  Ankle eversion 3 4-  (Blank rows = not tested)  TRANSFERS: Assistive device utilized: Environmental consultant - 2 wheeled  Sit to stand: Modified independence Stand to sit: Modified  independence  *poor hand placement keeping BUE's on RW for STS and stand to sit despite VC's and education for safe hand placement.   GAIT: Gait pattern: step through pattern, decreased stance time- Right, decreased ankle dorsiflexion- Right, and poor foot clearance- Right. Notable R ankle inversion in stance phase due to evertor weakness Distance walked: 10 meters Assistive device utilized: Walker - 2 wheeled Level of assistance: Modified independence Comments: Heavy BUE support on RW. PT adjusting RW height to reduced B shoulder shrug.   FUNCTIONAL TESTS:  5 times sit to stand: 29.37 seconds Timed up and go (TUG): 26.89 seconds 10 meter walk test: 0.48 m/s with RW  PATIENT SURVEYS:  FOTO 37 with target score of 43  TODAY'S TREATMENT:                                                                                                                              DATE: 12/13/22  Reviewed HEP (reps/sets/frequency). Educated on safe use of RW with hand placements with transfers to reduce falls risk.   PATIENT EDUCATION: Education details: HEP, safe use of AD, POC Person educated: Patient Education method: Explanation, Demonstration, Verbal cues, and Handouts Education comprehension: verbalized understanding and needs further education  HOME EXERCISE PROGRAM: Access Code: [email or web address] URL: [email or web address] Date: 12/13/2022 Prepared by: Ronnie Derby  Exercises - Supine Bridge  - 1 x daily - 7 x  weekly - 3 sets - 6 reps - Seated Scapular Retraction  - 1 x daily - 7 x weekly - 3 sets - 15 reps - Seated Ankle Circles  - 1 x daily - 7 x weekly - 3 sets - 15 reps  GOALS: Goals reviewed with patient? No  SHORT TERM GOALS: Target date: 12/24/22  Pt will be independent with HEP to improve RLE strength for transfers and gait Baseline: 12/13/22: Provided initial HEP Goal status: INITIAL  2.  Pt will demonstrate safe use of hand placement with transfers with LRAD to demonstrate reduced falls risk. Baseline: 12/13/22: BUE use on AD with STS and stand to sit transfers Goal status: INITIAL  LONG TERM GOALS: Target date: 03/07/23  Pt will improve FOTO to target score to demonstrate clinically significant improvement in functional mobility.  Baseline: 12/13/22: 37/43 Goal status: INITIAL  2.  Pt will complete TUG in 12 seconds or less with LRAD to demonstrate reduced falls risk with transfers and short distance gait Baseline: 12/13/22: 26.89 seconds Goal status: INITIAL  3.  Pt will improve 5xSTS to 11.4 seconds or less without UE support to demonstrate improved LE strength to match age based norms.  Baseline: : 12/13/22: 29.37 seconds Goal status: INITIAL  4.  Pt will improve FGA by at least 4 points to demonstrate clinically significant improvement in balance with community activities.  Baseline: 12/13/22: deferred to next session Goal status: INITIAL  5.  Pt will improve gait speed to at least 1.0 m/s with LRAD to demonstrate safe ability to complete  short community distances. Baseline: 12/13/22: 0.48 m/s with RW Goal status: INITIAL  ASSESSMENT:  CLINICAL IMPRESSION: Patient is a 62 y.o. female who was seen today for physical therapy evaluation and treatment for functional impairments s/p lumbar surgeries. Pt presents with RLE sensation changes and RLE weakness along with poor safety awareness with transfers and gait with her RW. Pt presents at high risk for falls as evidenced by 5xSTS, TUG,  and gait speed compared to age matched norms. These impairments have led to x3 falls at home, inability to drive, and loss of independence with community ADL completion. Pt will benefit from skilled PT services to maximize return to independence and reduced falls risk.   OBJECTIVE IMPAIRMENTS: Abnormal gait, decreased balance, decreased mobility, difficulty walking, decreased ROM, decreased strength, impaired sensation, and postural dysfunction.   ACTIVITY LIMITATIONS: carrying, lifting, bending, standing, stairs, transfers, reach over head, and locomotion level  PARTICIPATION LIMITATIONS: cleaning, laundry, driving, shopping, and community activity  PERSONAL FACTORS: Age, Past/current experiences, and Time since onset of injury/illness/exacerbation are also affecting patient's functional outcome.   REHAB POTENTIAL: Good  CLINICAL DECISION MAKING: Evolving/moderate complexity  EVALUATION COMPLEXITY: Moderate  PLAN:  PT FREQUENCY: 2x/week  PT DURATION: 12 weeks  PLANNED INTERVENTIONS: Therapeutic exercises, Therapeutic activity, Neuromuscular re-education, Balance training, Gait training, Patient/Family education, Self Care, Stair training, DME instructions, Manual therapy, and Re-evaluation  PLAN FOR NEXT SESSION: FGA, safety awareness, trial gait without AD. Re-assess HEP   Delphia Grates. Fairly IV, PT, DPT Physical Therapist- [facility]  [facility]  12/13/2022, 6:24 PM

## 2022-12-18 NOTE — Assessment & Plan Note (Addendum)
PE reassuring. Advised to consume BRAT diet and adequate hydration. Avoid to consume food containing artificial sweetener. Will check BMP

## 2022-12-20 ENCOUNTER — Other Ambulatory Visit: Payer: Self-pay | Admitting: Psychiatry

## 2022-12-20 DIAGNOSIS — F4310 Post-traumatic stress disorder, unspecified: Secondary | ICD-10-CM

## 2022-12-21 NOTE — Telephone Encounter (Signed)
received message that pt needs refill on the duloxetine 30mg  pt last seen on 7-28. i did not see a follow up appt so i ask frontdesk to call patient to set up a follow up appt.

## 2022-12-22 NOTE — Telephone Encounter (Signed)
Message left on 12/21/22 to call and schedule

## 2022-12-28 ENCOUNTER — Encounter: Payer: 59 | Admitting: Physical Medicine and Rehabilitation

## 2022-12-28 VITALS — Ht 62.0 in

## 2022-12-28 NOTE — Progress Notes (Addendum)
   REFERRING PHYSICIAN:  Nicholaus Corolla [address] [place],  [place] [address]   DOS: 10/12/22 L3-5 lumbar decompression with evacuation of epidural hematoma  DOS:   10/10/22 L3-5 right L4-L5 laminoforaminotomy  HISTORY OF PRESENT ILLNESS: Susan [name] is status post L3-5 lumbar decompression with evacuation of epidural hematoma.   She was referred to PT at her last visit and also to PMR.   Had initial eval for PT on 12/13/22 and had a visit today. She just got back Monday from trip to [place]. She notes some increased pain in her lower back that is worse on the left. She continues with her right leg pain and thinks it may be a little worse. She thinks her right leg strength is about the same.   She had appointment with PMR yesterday and is going to reschedule- she said the provider was 2 hours behind.   She still has some difficulty with dribbling urine and has had some accidents secondary to diarrhea.  She has difficulty with sensation of her private area.  She switches between diarrhea and constipation.  No change since last visit.   She is out of her pain medication from Dr. Yves Dill and is early for a refill. She has a pain contract with him.    PHYSICAL EXAMINATION:  General: Patient is well developed, well nourished, calm, collected, and in no apparent distress.   NEUROLOGICAL:  General: In no acute distress.   Awake, alert, oriented to person, place, and time.  Pupils equal round and reactive to light.  Facial tone is symmetric.    Strength:            Side Iliopsoas Quads Hamstring PF DF EHL  R 5 5 5  4+ 4 4  L 5 5 5 5 5 5    Incision is well healed    ROS (Neurologic):  Negative except as noted above  IMAGING: Nothing new to review.   ASSESSMENT/PLAN:  [name] is has seen some improvement after her surgery. Appears that cauda equina syndrome is slowly improving, but she still has some issues with bowel and bladder control.   She notes  slight flare up of back and right leg pain that is likely due to increased activity when she went to [place].   Treatment options reviewed with patient and following plan made:   - Call Dr. Yves Dill regarding refills of norco.  - Continue with PT for lumbar spine.  - Follow up with PMR in [place] as scheduled.  - Follow up with Dr. Myer Haff in 3 months and prn.   Drake Leach PA-C Department of neurosurgery

## 2022-12-28 NOTE — Progress Notes (Signed)
Subjective:    Patient ID: Susan [name], female    DOB: Sep 22, 1960, 62 y.o.   MRN: [id number]  HPI  Pain Inventory Average Pain 5 Pain Right Now 6 My pain is dull, tingling, and aching  In the last 24 hours, has pain interfered with the following? General activity 0 Relation with others 0 Enjoyment of life 2 What TIME of day is your pain at its worst? evening Sleep (in general) Fair  Pain is worse with: walking and standing Pain improves with: rest and medication Relief from Meds: 5  use a walker how many minutes can you walk? 5 ability to climb steps?  no do you drive?  no Do you have any goals in this area?  yes Transfers alone  retired  Do you have any goals in this area? Yes I need assistance with the following:  Meal prep Household duties shopping  bladder control problems bowel control problems weakness numbness tingling trouble walking depression anxiety  none  New patient    Family History  Problem Relation Age of Onset   Arthritis Mother    Asthma Mother    Mental illness Mother    Thyroid disease Mother    COPD Mother    Heart disease Mother    Congestive Heart Failure Mother    Alcohol abuse Mother    Eating disorder Mother    Bipolar disorder Mother    Alcohol abuse Father    Heart attack Father    Alcohol abuse Sister    Drug abuse Sister    Mental illness Sister    Mental illness Sister    Fibromyalgia Sister    Obesity Sister    Pneumonia Sister    Depression Sister    Mental illness Sister    Alcohol abuse Sister    Drug abuse Sister    Arthritis Brother    Mental illness Brother    Cancer Brother        non-hodkins lymphoma   Drug abuse Daughter    Drug abuse Son    Alcohol abuse Son    Drug abuse Son    Alcohol abuse Son    Diabetes Neg Hx    Stroke Neg Hx    Breast cancer Neg Hx    Thyroid cancer Neg Hx    Social History   Socioeconomic History   Marital status: Widowed    Spouse name: Not on file    Number of children: 3   Years of education: Not on file   Highest education level: Some college, no degree  Occupational History   Occupation: disabled  Tobacco Use   Smoking status: Every Day    Current packs/day: 0.50    Average packs/day: 0.5 packs/day for 41.0 years (20.5 ttl pk-yrs)    Types: Cigarettes   Smokeless tobacco: Never  Vaping Use   Vaping status: Never Used  Substance and Sexual Activity   Alcohol use: No    Alcohol/week: 0.0 standard drinks of 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   Drug use: No    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   Sexual activity: Not Currently  Other Topics Concern   Not on file  Social History Narrative   Not on file   Social Determinants of Health   Financial Resource Strain: Low Risk  (06/02/2021)   Overall Financial Resource Strain (CARDIA)    Difficulty of Paying Living Expenses: Not hard at all  Food Insecurity:  No Food Insecurity (10/24/2022)   Hunger Vital Sign    Worried About Running Out of Food in the Last Year: Never true    Ran Out of Food in the Last Year: Never true  Transportation Needs: No Transportation Needs (10/24/2022)   PRAPARE - Administrator, Civil Service (Medical): No    Lack of Transportation (Non-Medical): No  Physical Activity: Sufficiently Active (06/02/2021)   Exercise Vital Sign    Days of Exercise per Week: 5 days    Minutes of Exercise per Session: 30 min  Stress: No Stress Concern Present (06/02/2021)   [employer] of Occupational Health - Occupational Stress Questionnaire    Feeling of Stress : Not at all  Social Connections: Moderately Isolated (06/02/2021)   Social Connection and Isolation Panel [NHANES]    Frequency of Communication with Friends and Family: More than three times a week    Frequency of Social Gatherings with Friends and Family: Three times a week    Attends Religious Services: Never    Active Member of Clubs or Organizations: Yes     Attends Banker Meetings: More than 4 times per year    Marital Status: Widowed   Past Surgical History:  Procedure Laterality Date   BILATERAL SALPINGOOPHORECTOMY     due to abnormal mass   BREAST BIOPSY Left    neg   BREAST SURGERY Left 20 yrs ago   CESAREAN SECTION     COLONOSCOPY     COLONOSCOPY WITH PROPOFOL N/A 10/16/2017   Procedure: COLONOSCOPY WITH PROPOFOL;  Surgeon: Wyline Mood, MD;  Location: [facility]COPY;  Service: Gastroenterology;  Laterality: N/A;   COLONOSCOPY WITH PROPOFOL N/A 02/16/2021   Procedure: COLONOSCOPY WITH PROPOFOL;  Surgeon: Wyline Mood, MD;  Location: [facility] ENDOSCOPY;  Service: Gastroenterology;  Laterality: N/A;   FORAMINOTOMY 1 LEVEL Right 10/10/2022   Procedure: RIGHT L4-5 LAMINOFORAMINOTOMY;  Surgeon: Venetia Night, MD;  Location: [facility] ORS;  Service: Neurosurgery;  Laterality: Right;   HERNIA REPAIR  06/2011, July 2014   Ventral wall repair with Physiomesh   HERNIA REPAIR     2nd.vental wall repair   JOINT REPLACEMENT Right    knee   LUMBAR LAMINECTOMY/ DECOMPRESSION WITH MET-RX N/A 10/12/2022   Procedure: LUMBAR LAMINECTOMY/ DECOMPRESSION WITH MET-RX;  Surgeon: Venetia Night, MD;  Location: [facility] ORS;  Service: Neurosurgery;  Laterality: N/A;   TONSILLECTOMY     TOTAL HIP ARTHROPLASTY Left 07/23/2019   Procedure: TOTAL HIP ARTHROPLASTY;  Surgeon: Christena Flake, MD;  Location: [facility] ORS;  Service: Orthopedics;  Laterality: Left;   TOTAL KNEE ARTHROPLASTY Right 05/10/2016   Procedure: TOTAL KNEE ARTHROPLASTY;  Surgeon: Christena Flake, MD;  Location: [facility] ORS;  Service: Orthopedics;  Laterality: Right;   TUBAL LIGATION     Past Medical History:  Diagnosis Date   ADD (attention deficit disorder)    Alcohol abuse    Anemia    Anxiety    Aortic atherosclerosis (HCC) 02/20/2018   Chest CT Sept 2019   Arthritis    rheumatoid arthritis   Asthma    Bipolar disorder (HCC)    Centrilobular emphysema (HCC)    Cocaine use disorder,  moderate, in sustained remission (HCC)    Constipation    Degenerative disc disease at L5-S1 level 09/28/2016   See ortho note May 2018   Depression    bipolar, hx of suicide attempt   Diabetes mellitus, type 2 (HCC)    Diverticulitis 2013   DOE (dyspnea on  exertion)    Drug use    Family history of adverse reaction to anesthesia    mom-delayed emergence   GERD (gastroesophageal reflux disease)    H/O suicide attempt    slit wrists   Hepatitis C 06/26/2014   treated with Harvoni   Hip pain    History of echocardiogram    a. 04/2019 Echo: EF >65%, nl RV fxn.   History of MRSA infection 2013   HLD (hyperlipidemia)    Hypertension    a. 06/2021 Renal Duplex: ? bilat RAS; b. 06/2021 CTA Abd: No signif RAS.   Hyponatremia    Hypothyroidism    Incisional hernia 11/08/2012   Knee pain    Left carotid bruit    Morbid obesity (HCC)    Multinodular thyroid    OSA (obstructive sleep apnea)    OSA on CPAP    Osteoporosis    Palpitations    Post-traumatic osteoarthritis of right knee 09/24/2015   Recurrent ventral hernia 11/08/2012   Status post total right knee replacement using cement 05/10/2016   Stroke (HCC) 07/05/2022   right arm,torso and right leg numbness   Tobacco use disorder    Vitamin B12 deficiency    Vitamin D deficiency disease    There were no vitals taken for this visit.  Opioid Risk Score:   Fall Risk Score:  `1  Depression screen PHQ 2/9     12/02/2022   11:00 AM 11/29/2022    9:15 AM 11/21/2022    2:33 PM 09/28/2022    9:21 AM 08/31/2022    1:22 PM 06/28/2022    8:13 AM 05/25/2022    4:46 PM  Depression screen PHQ 2/9  Decreased Interest 1  1 1  1    Down, Depressed, Hopeless 1  1 0  0   PHQ - 2 Score 2  2 1  1    Altered sleeping 1  1 0  3   Tired, decreased energy 2  2 1  3    Change in appetite 1  0 0  3   Feeling bad or failure about yourself  1  0 0  0   Trouble concentrating 1  1 1  1    Moving slowly or fidgety/restless 0  0 0  0   Suicidal  thoughts 0  0 0  0   PHQ-9 Score 8  6 3  11    Difficult doing work/chores Somewhat difficult  Somewhat difficult Somewhat difficult  Somewhat difficult      Information is confidential and restricted. Go to Review Flowsheets to unlock data.     Review of Systems  Musculoskeletal:  Positive for back pain.       Rt side arm leg foot pain  All other systems reviewed and are negative.      Objective:   Physical Exam        Assessment & Plan:

## 2022-12-29 ENCOUNTER — Ambulatory Visit: Payer: 59

## 2022-12-29 ENCOUNTER — Encounter: Payer: Self-pay | Admitting: Orthopedic Surgery

## 2022-12-29 ENCOUNTER — Ambulatory Visit (INDEPENDENT_AMBULATORY_CARE_PROVIDER_SITE_OTHER): Payer: 59 | Admitting: Orthopedic Surgery

## 2022-12-29 VITALS — BP 138/72 | Temp 98.2°F | Ht 61.0 in | Wt 210.0 lb

## 2022-12-29 DIAGNOSIS — M6281 Muscle weakness (generalized): Secondary | ICD-10-CM | POA: Diagnosis not present

## 2022-12-29 DIAGNOSIS — Z9889 Other specified postprocedural states: Secondary | ICD-10-CM

## 2022-12-29 DIAGNOSIS — R29898 Other symptoms and signs involving the musculoskeletal system: Secondary | ICD-10-CM

## 2022-12-29 DIAGNOSIS — Z09 Encounter for follow-up examination after completed treatment for conditions other than malignant neoplasm: Secondary | ICD-10-CM

## 2022-12-29 DIAGNOSIS — G834 Cauda equina syndrome: Secondary | ICD-10-CM | POA: Diagnosis not present

## 2022-12-29 DIAGNOSIS — R262 Difficulty in walking, not elsewhere classified: Secondary | ICD-10-CM

## 2022-12-29 NOTE — Therapy (Signed)
OUTPATIENT PHYSICAL THERAPY NEURO TREATMENT   Patient Name: Susan [name] MRN: [id number] DOB:August 05, 1960, 62 y.o., female Today's Date: 12/29/2022   PCP: [name] MD REFERRING PROVIDER: Venetia Night MD  END OF SESSION:  PT End of Session - 12/29/22 0810     Visit Number 2    Number of Visits 25    Date for PT Re-Evaluation 03/07/23    PT Start Time 0814    PT Stop Time 0858    PT Time Calculation (min) 44 min    Equipment Utilized During Treatment Gait belt    Activity Tolerance Patient tolerated treatment well    Behavior During Therapy [facility] for tasks assessed/performed             Past Medical History:  Diagnosis Date   ADD (attention deficit disorder)    Alcohol abuse    Anemia    Anxiety    Aortic atherosclerosis (HCC) 02/20/2018   Chest CT Sept 2019   Arthritis    rheumatoid arthritis   Asthma    Bipolar disorder (HCC)    Centrilobular emphysema (HCC)    Cocaine use disorder, moderate, in sustained remission (HCC)    Constipation    Degenerative disc disease at L5-S1 level 09/28/2016   See ortho note May 2018   Depression    bipolar, hx of suicide attempt   Diabetes mellitus, type 2 (HCC)    Diverticulitis 2013   DOE (dyspnea on exertion)    Drug use    Family history of adverse reaction to anesthesia    mom-delayed emergence   GERD (gastroesophageal reflux disease)    H/O suicide attempt    slit wrists   Hepatitis C 06/26/2014   treated with Harvoni   Hip pain    History of echocardiogram    a. 04/2019 Echo: EF >65%, nl RV fxn.   History of MRSA infection 2013   HLD (hyperlipidemia)    Hypertension    a. 06/2021 Renal Duplex: ? bilat RAS; b. 06/2021 CTA Abd: No signif RAS.   Hyponatremia    Hypothyroidism    Incisional hernia 11/08/2012   Knee pain    Left carotid bruit    Morbid obesity (HCC)    Multinodular thyroid    OSA (obstructive sleep apnea)    OSA on CPAP    Osteoporosis    Palpitations    Post-traumatic  osteoarthritis of right knee 09/24/2015   Recurrent ventral hernia 11/08/2012   Status post total right knee replacement using cement 05/10/2016   Stroke (HCC) 07/05/2022   right arm,torso and right leg numbness   Tobacco use disorder    Vitamin B12 deficiency    Vitamin D deficiency disease    Past Surgical History:  Procedure Laterality Date   BILATERAL SALPINGOOPHORECTOMY     due to abnormal mass   BREAST BIOPSY Left    neg   BREAST SURGERY Left 20 yrs ago   CESAREAN SECTION     COLONOSCOPY     COLONOSCOPY WITH PROPOFOL N/A 10/16/2017   Procedure: COLONOSCOPY WITH PROPOFOL;  Surgeon: Wyline Mood, MD;  Location: [facility] ENDOSCOPY;  Service: Gastroenterology;  Laterality: N/A;   COLONOSCOPY WITH PROPOFOL N/A 02/16/2021   Procedure: COLONOSCOPY WITH PROPOFOL;  Surgeon: Wyline Mood, MD;  Location[facility] ENDOSCOPY;  Service: Gastroenterology;  Laterality: N/A;   FORAMINOTOMY 1 LEVEL Right 10/10/2022   Procedure: RIGHT L4-5 LAMINOFORAMINOTOMY;  Surgeon: Venetia Night, MD;  Location: [facility] ORS;  Service: Neurosurgery;  Laterality: Right;  HERNIA REPAIR  06/2011, July 2014   Ventral wall repair with Physiomesh   HERNIA REPAIR     2nd.vental wall repair   JOINT REPLACEMENT Right    knee   LUMBAR LAMINECTOMY/ DECOMPRESSION WITH MET-RX N/A 10/12/2022   Procedure: LUMBAR LAMINECTOMY/ DECOMPRESSION WITH MET-RX;  Surgeon: Venetia Night, MD;  Location: [facility] ORS;  Service: Neurosurgery;  Laterality: N/A;   TONSILLECTOMY     TOTAL HIP ARTHROPLASTY Left 07/23/2019   Procedure: TOTAL HIP ARTHROPLASTY;  Surgeon: Christena Flake, MD;  Location: [facility] ORS;  Service: Orthopedics;  Laterality: Left;   TOTAL KNEE ARTHROPLASTY Right 05/10/2016   Procedure: TOTAL KNEE ARTHROPLASTY;  Surgeon: Christena Flake, MD;  Location: [facility] ORS;  Service: Orthopedics;  Laterality: Right;   TUBAL LIGATION     Patient Active Problem List   Diagnosis Date Noted   Bipolar 1 disorder, depressed, moderate (HCC) 11/29/2022    Acute respiratory failure with hypoxia (HCC) 10/13/2022   AKI (acute kidney injury) (HCC) 10/13/2022   Anemia 10/13/2022   Cauda equina syndrome (HCC) 10/12/2022   Epidural hematoma (HCC) 10/12/2022   Mass in epidural space 10/12/2022   Synovial cyst of lumbar facet joint 10/10/2022   Lumbar radiculopathy 10/10/2022   S/P laminectomy 10/10/2022   Preop examination 09/28/2022   Epigastric pain 07/22/2022   Enlarged thyroid 07/22/2022   Chronic diarrhea 07/22/2022   CVA (cerebrovascular accident) (HCC) 07/05/2022   Skin lesion 06/28/2022   BMI 38.0-38.9,adult 07/14/2021   Type 2 diabetes mellitus without complication, without long-term current use of insulin (HCC) 06/16/2021   H/O thyroid nodule 05/31/2021   History of hepatitis C- treated 2014- 2015 with [facility] gastroenterology and followed.  05/19/2021   Left carotid bruit 04/28/2021   Anxiety 05/28/2020   Bipolar affective disorder in remission (HCC) 05/28/2020   Bipolar disorder, in full remission, most recent episode mixed (HCC) 03/06/2020   Need for immunization against influenza 01/27/2020   Routine physical examination 01/27/2020   Morbid obesity (HCC) 01/27/2020   Osteoporosis 11/06/2019   Bipolar 1 disorder, mixed, mild (HCC) 10/29/2019   PTSD (post-traumatic stress disorder) 09/27/2019   Bereavement 09/27/2019   Cocaine use disorder, moderate, in sustained remission (HCC) 09/27/2019   Status post total hip replacement, left 07/23/2019   Primary osteoarthritis of left hip 06/14/2019   Depression 10/23/2018   Allergic rhinitis 08/23/2018   Aortic atherosclerosis (HCC) 02/20/2018   Centrilobular emphysema (HCC) 02/20/2018   Hyperlipidemia 03/28/2017   Degenerative disc disease at L5-S1 level 09/28/2016   Tobacco use disorder 04/21/2016   GERD (gastroesophageal reflux disease) 12/17/2015   Chronic back pain 10/09/2015   Post-traumatic osteoarthritis of right knee 09/24/2015   Alcohol use disorder, severe, in sustained  remission (HCC) 04/13/2015   Vitamin D deficiency 04/09/2015   Vitamin B12 deficiency 04/09/2015   OSA on CPAP 04/09/2015   Subclinical hyperthyroidism 06/26/2014   Goiter colloid, toxic, nodular 06/26/2014   Hypertension 06/26/2014   Tobacco use disorder, continuous 06/26/2014   Bipolar I disorder (HCC) 06/26/2014   Lumbar radiculitis 10/03/2013   Diverticulosis 08/24/2013    ONSET DATE: 10/12/22  REFERRING DIAG: G64.403 (ICD-10-CM) - Status post lumbar spine surgery for decompression of spinal cord G83.4 (ICD-10-CM) - Cauda equina syndrome (HCC)  THERAPY DIAG:  Muscle weakness (generalized)  Difficulty in walking, not elsewhere classified  Rationale for Evaluation and Treatment: Rehabilitation  SUBJECTIVE:  SUBJECTIVE STATEMENT: Pt reports having a good vacation. Compliant with her HEP. Denies falling. Has had a couple near falls.   Pt accompanied by: self  PERTINENT HISTORY:  Susan [name] is a 62 y/o F with PMH: HTN, DM2, HLD, ETOH use, THA, PTSD, cocaine, bipolar, and L thalamic infarct (06/2022), s/p lumbar laminoforaminotomy (10/10/22). Presenting post L3-5 decompression with evacuation of epidural hematoma on 10/12/22 for Cauda Equina Syndrome. Pt has residual RLE deficits after her lumbar surgeries leading to ambulating with RW. She was independent prior to her surgeries now reporting balance and gait difficulties. Has had some Worsening RUE N/T since her CVA in February after her lumbar surgeries. Reports N/T 6-7/10 on severity. Mentions some falls when not using her RW. Feels like she always has to have a hand on something to steady herself.  Pt has residual RLE deficits after her lumbar surgeries leading to ambulating with RW. She was independent prior to her surgeries now reporting balance  and gait difficulties. Has had some Worsening RUE N/T since her CVA in February after her lumbar surgeries. Reports N/T 6-7/10 on severity. Mentions some falls when not using her RW. Feels like she always has to have a hand on something to steady herself. Has had some falls anterior standing too close in the RW, a fall walking up the stairs with the RW. She is able to get herself up. Pt describing some unsafe scenarios with poor RW use with transfers and asc/desc curbs.   PAIN:  Are you having pain? No  PRECAUTIONS: Back  RED FLAGS: Bowel or bladder incontinence: Yes: MD aware since surgery.     WEIGHT BEARING RESTRICTIONS: No  FALLS: Has patient fallen in last 6 months? Yes. Number of falls 3  LIVING ENVIRONMENT: Lives with: lives with their son   PLOF: Independent  PATIENT GOALS: Improve her walking and   OBJECTIVE:   DIAGNOSTIC FINDINGS: CLINICAL DATA:  Elective surgery   EXAM: LUMBAR SPINE - 2-3 VIEW   COMPARISON:  10/10/2022   FINDINGS: Intraoperative fluoroscopy is utilized for surgical control purposes. Fluoroscopy time is recorded at 6 seconds. Dose 5.33 mGy. Two spot fluoroscopic images are obtained. Initial image demonstrates a localization marker posterior to the L4 vertebra. Second image demonstrates a localization marker posterior to the L3-4 interspace level.   IMPRESSION: Intraoperative fluoroscopy utilized for surgical control purposes for lumbar spine localization.     Electronically Signed   By: Burman Nieves M.D.   On: 10/12/2022 20:24  COGNITION: Overall cognitive status: Within functional limits for tasks assessed   SENSATION: Light touch: Impaired  RLE: impaired L2, L4-L5  COORDINATION: Heel to shin normal bilaterally   PROPRIOCEPTION: Normal bilat   POSTURE:  Heavy BUE support on RW with standing tasks with bilateral upper trap use   LUMBAR AROM: Flexion: 75% limited  Extension: 50% limited  Rotation R/L: 50% limited   Lateral flexion R/L: 50% limited  LOWER EXTREMITY AROM:  L ankle inversion: 20, eversion: 35  R ankle inversion: 15 , eversion: 25    LOWER EXTREMITY MMT:    MMT Right Eval Left Eval  Hip flexion 4- 4  Hip extension    Hip abduction    Hip adduction    Hip internal rotation    Hip external rotation    Knee flexion 4- 4+  Knee extension 4 4+  Ankle dorsiflexion 4- 4+  Ankle plantarflexion    Ankle inversion 3 4+  Ankle eversion 3 4-  (Blank rows = not  tested)  TRANSFERS: Assistive device utilized: Environmental consultant - 2 wheeled  Sit to stand: Modified independence Stand to sit: Modified independence  *poor hand placement keeping BUE's on RW for STS and stand to sit despite VC's and education for safe hand placement.   GAIT: Gait pattern: step through pattern, decreased stance time- Right, decreased ankle dorsiflexion- Right, and poor foot clearance- Right. Notable R ankle inversion in stance phase due to evertor weakness Distance walked: 10 meters Assistive device utilized: Walker - 2 wheeled Level of assistance: Modified independence Comments: Heavy BUE support on RW. PT adjusting RW height to reduced B shoulder shrug.   FUNCTIONAL TESTS:  5 times sit to stand: 29.37 seconds Timed up and go (TUG): 26.89 seconds 10 meter walk test: 0.48 m/s with RW  PATIENT SURVEYS:  FOTO 37 with target score of 43  TODAY'S TREATMENT:                                                                                                                              DATE: 12/29/22   Neuro Re-ed:  FGA: 14/30. Reliant on RW to complete testing. Seated rest half way through testing.   There.ex:   Reviewed HEP:    Performed bridges: 3x6    Seated scap retractions: x10    Seated ankle circles: x10 cw/ccw  STS from standard height chair: 2x10 no UE support  Standing alt marches at [employer]: 2x10/LE  Seated ankle heel to toe raises: 2x10/side  Updated HEP providing new hand out. Reviewed  Reps/sets/frequency.    PATIENT EDUCATION: Education details: HEP, safe use of AD, POC Person educated: Patient Education method: Explanation, Demonstration, Verbal cues, and Handouts Education comprehension: verbalized understanding and needs further education  HOME EXERCISE PROGRAM: Access Code: [email or web address] URL: [email or web address] Date: 12/13/2022 Prepared by: Ronnie Derby  Exercises - Supine Bridge  - 1 x daily - 7 x weekly - 3 sets - 6 reps - Seated Scapular Retraction  - 1 x daily - 7 x weekly - 3 sets - 15 reps - Seated Ankle Circles  - 1 x daily - 7 x weekly - 3 sets - 15 reps  GOALS: Goals reviewed with patient? No  SHORT TERM GOALS: Target date: 12/24/22  Pt will be independent with HEP to improve RLE strength for transfers and gait Baseline: 12/13/22: Provided initial HEP Goal status: INITIAL  2.  Pt will demonstrate safe use of hand placement with transfers with LRAD to demonstrate reduced falls risk. Baseline: 12/13/22: BUE use on AD with STS and stand to sit transfers Goal status: INITIAL  LONG TERM GOALS: Target date: 03/07/23  Pt will improve FOTO to target score to demonstrate clinically significant improvement in functional mobility.  Baseline: 12/13/22: 37/43 Goal status: INITIAL  2.  Pt will complete TUG in 12 seconds or less with LRAD to demonstrate reduced falls risk with transfers and short distance gait Baseline: 12/13/22: 26.89 seconds Goal status: INITIAL  3.  Pt  will improve 5xSTS to 11.4 seconds or less without UE support to demonstrate improved LE strength to match age based norms.  Baseline: : 12/13/22: 29.37 seconds Goal status: INITIAL  4.  Pt will improve FGA by at least 4 points to demonstrate clinically significant improvement in balance with community activities.  Baseline: 12/13/22: deferred to next session; 12/29/22: 14/30 Goal status: INITIAL  5.  Pt will improve gait speed to at least 1.0 m/s with LRAD to demonstrate safe  ability to complete short community distances. Baseline: 12/13/22: 0.48 m/s with RW Goal status: INITIAL  ASSESSMENT:  CLINICAL IMPRESSION: Pt arriving to for first treatment after vacation. Pt demonstrates excellent understanding of her HEP. Completing FGA to assess balance and falls risk. Pt unable to complete test without her RW scoring 14/30 demonstrating significant falls risk with community activities. Pt remains grossly weak in her RLE compared to LLE relying on multimodal cuing for form/technique to reduce L weight shift for compensation with standing strength exercise. Updated HEP to reflect seated heel and toe raises to assist in ankle strengthening for return to normalized gait mechanics. Pt understanding of updated HEP. Pt will benefit from skilled PT services to maximize return to independence and reduced falls risk.   OBJECTIVE IMPAIRMENTS: Abnormal gait, decreased balance, decreased mobility, difficulty walking, decreased ROM, decreased strength, impaired sensation, and postural dysfunction.   ACTIVITY LIMITATIONS: carrying, lifting, bending, standing, stairs, transfers, reach over head, and locomotion level  PARTICIPATION LIMITATIONS: cleaning, laundry, driving, shopping, and community activity  PERSONAL FACTORS: Age, Past/current experiences, and Time since onset of injury/illness/exacerbation are also affecting patient's functional outcome.   REHAB POTENTIAL: Good  CLINICAL DECISION MAKING: Evolving/moderate complexity  EVALUATION COMPLEXITY: Moderate  PLAN:  PT FREQUENCY: 2x/week  PT DURATION: 12 weeks  PLANNED INTERVENTIONS: Therapeutic exercises, Therapeutic activity, Neuromuscular re-education, Balance training, Gait training, Patient/Family education, Self Care, Stair training, DME instructions, Manual therapy, and Re-evaluation  PLAN FOR NEXT SESSION: safety awareness with LRAD. RLE strength tasks.    Delphia Grates. Fairly IV, PT, DPT Physical Therapist- [facility]  [facility]  12/29/2022, 9:10 AM

## 2022-12-30 ENCOUNTER — Encounter: Payer: Self-pay | Admitting: Nurse Practitioner

## 2022-12-30 ENCOUNTER — Ambulatory Visit (INDEPENDENT_AMBULATORY_CARE_PROVIDER_SITE_OTHER): Payer: 59 | Admitting: Nurse Practitioner

## 2022-12-30 VITALS — BP 122/60 | HR 73 | Temp 98.3°F | Ht 61.0 in | Wt 215.4 lb

## 2022-12-30 DIAGNOSIS — S90821A Blister (nonthermal), right foot, initial encounter: Secondary | ICD-10-CM | POA: Diagnosis not present

## 2022-12-30 NOTE — Progress Notes (Unsigned)
Bethanie Dicker, NP-C Phone: [phone]  Susan [name] is a 62 y.o. female who presents today for foot wound.   Patient with large blister on right heel. She noticed it appearing approximately one week ago while on a trip in [place]. She has been walking more on her trip and was wearing new leather shoes. The blister ruptured open on Monday evening. She feels that is has been improving since that time. She is unable to feel her feet and is a diabetic. She has been keeping the area clean, dry and covered. She has been using DermaPlast and medicated band-aids. The area is tender and stings/burns. She denies drainage. Denies warmth or erythema. Denies fevers/chills.   Social History   Tobacco Use  Smoking Status Every Day   Current packs/day: 0.50   Average packs/day: 0.5 packs/day for 41.0 years (20.5 ttl pk-yrs)   Types: Cigarettes  Smokeless Tobacco Never    Current Outpatient Medications on File Prior to Visit  Medication Sig Dispense Refill   acetaminophen (TYLENOL) 500 MG tablet Take 1,000 mg by mouth 2 (two) times daily.     albuterol (VENTOLIN HFA) 108 (90 Base) MCG/ACT inhaler Inhale 2 puffs into the lungs every 6 (six) hours as needed for wheezing or shortness of breath. 8 g 2   carvedilol (COREG) 12.5 MG tablet Take 12.5 mg by mouth 2 (two) times daily.     celecoxib (CELEBREX) 100 MG capsule TAKE 1 CAPSULE(100 MG) BY MOUTH TWICE DAILY 60 capsule 0   clotrimazole (LOTRIMIN) 1 % cream Apply 1 application  topically 2 (two) times daily as needed.     cyclobenzaprine (FLEXERIL) 5 MG tablet Take 5 mg by mouth 2 (two) times daily.     DULoxetine (CYMBALTA) 20 MG capsule TAKE ONE CAPSULE BY MOUTH DAILY WITH 30 MG 30 capsule 3   DULoxetine (CYMBALTA) 30 MG capsule TAKE ONE CAPSULE BY MOUTH DAILY with 20 mg cap. 30 capsule 3   gabapentin (NEURONTIN) 300 MG capsule Take 600 mg by mouth 2 (two) times daily.      HYDROcodone-acetaminophen (NORCO/VICODIN) 5-325 MG tablet Take 1 tablet by  mouth daily as needed for moderate pain.     linaclotide (LINZESS) 145 MCG CAPS capsule Take 145 mcg by mouth daily before breakfast.     OZEMPIC, 1 MG/DOSE, 4 MG/3ML SOPN INJECT 1MG  UNDER THE SKIN (SUBCUTANEOUSLY) ONCE WEEKLY ON SUNDAY 3 mL 1   rosuvastatin (CRESTOR) 20 MG tablet Take 1 tablet (20 mg total) by mouth daily. 90 tablet 3   spironolactone (ALDACTONE) 25 MG tablet Take 1 tablet (25 mg total) by mouth daily. 90 tablet 1   telmisartan (MICARDIS) 80 MG tablet TAKE ONE TABLET BY MOUTH DAILY 90 tablet 1   traZODone (DESYREL) 50 MG tablet TAKE 1 TABLET(50 MG) BY MOUTH AT BEDTIME 30 tablet 1   No current facility-administered medications on file prior to visit.    ROS see history of present illness  Objective  Physical Exam Vitals:   12/30/22 0807  BP: 122/60  Pulse: 73  Temp: 98.3 F (36.8 C)  SpO2: 100%    BP Readings from Last 3 Encounters:  12/30/22 122/60  12/29/22 138/72  12/02/22 108/60   Wt Readings from Last 3 Encounters:  12/30/22 215 lb 6.4 oz (97.7 kg)  12/29/22 210 lb (95.3 kg)  12/02/22 210 lb 2 oz (95.3 kg)    Physical Exam Constitutional:      General: She is not in acute distress.    Appearance:  Normal appearance.  HENT:     Head: Normocephalic.  Cardiovascular:     Rate and Rhythm: Normal rate and regular rhythm.     Heart sounds: Normal heart sounds.  Pulmonary:     Effort: Pulmonary effort is normal.     Breath sounds: Normal breath sounds.  Musculoskeletal:       Feet:  Feet:     Right foot:     Skin integrity: Blister (large, superficial- medial heel, ruptured open) present.     Comments: No signs of infections noted. No drainage, surrounding erythema or warmth.  Skin:    General: Skin is warm and dry.  Neurological:     General: No focal deficit present.     Mental Status: She is alert.  Psychiatric:        Mood and Affect: Mood normal.        Behavior: Behavior normal.    Assessment/Plan: Please see individual problem  list.  Blister of foot without infection, right, initial encounter Assessment & Plan: Due to friction from new shoes and increased walking. Area cleaned, non stick gauze applied and foot wrapped with gauze and coban. She will keep the area clean and dry, without a dressing when at home and dress it when out. Advised to clean with antibacterial soap twice daily. Increased risk for infection and delayed healing due to diabetes. She will monitor for signs of infection. Strict precautions given to patient. Follow up in one week for wound check, sooner PRN.     Return in about 1 week (around 01/06/2023) for Wound care.   Bethanie Dicker, NP-C [facility] [employer]

## 2023-01-02 ENCOUNTER — Other Ambulatory Visit: Payer: Self-pay | Admitting: Family Medicine

## 2023-01-02 ENCOUNTER — Ambulatory Visit: Payer: 59

## 2023-01-02 NOTE — Telephone Encounter (Signed)
Medication was discontinued on 11/21/2022. Is it okay to refuse refill request?

## 2023-01-05 ENCOUNTER — Telehealth: Payer: Self-pay | Admitting: Orthopedic Surgery

## 2023-01-05 ENCOUNTER — Other Ambulatory Visit: Payer: Self-pay | Admitting: Child and Adolescent Psychiatry

## 2023-01-05 DIAGNOSIS — M5416 Radiculopathy, lumbar region: Secondary | ICD-10-CM

## 2023-01-05 DIAGNOSIS — S90821A Blister (nonthermal), right foot, initial encounter: Secondary | ICD-10-CM | POA: Insufficient documentation

## 2023-01-05 DIAGNOSIS — G4700 Insomnia, unspecified: Secondary | ICD-10-CM

## 2023-01-05 DIAGNOSIS — Z9889 Other specified postprocedural states: Secondary | ICD-10-CM

## 2023-01-05 NOTE — Assessment & Plan Note (Addendum)
Due to friction from new shoes and increased walking. Area cleaned, non stick gauze applied and foot wrapped with gauze and coban. She will keep the area clean and dry, without a dressing when at home and dress it when out. Advised to clean with antibacterial soap twice daily. Increased risk for infection and delayed healing due to diabetes. She will monitor for signs of infection. Strict precautions given to patient. Follow up in one week for wound check, sooner PRN.

## 2023-01-05 NOTE — Telephone Encounter (Signed)
Dr. Eappen's pt

## 2023-01-06 NOTE — Telephone Encounter (Signed)
I spoke with the patient she stated that she doesn't need a refill of Celebrex at this time. She has about 2 weeks left.   She denies any side effects to this medication.

## 2023-01-06 NOTE — Telephone Encounter (Signed)
I sent her a Mychart message yesterday and she did not reply.   Please call her - is she still taking celebrex twice a day? Does she need a refill? Any side effects with the medication?   Let me know.

## 2023-01-06 NOTE — Telephone Encounter (Signed)
Susan [name] sent refill of celebrex to be filled in on 01/16/23. She's has no side effects/reactions to the celebrex since she's been taking it.

## 2023-01-10 DIAGNOSIS — E041 Nontoxic single thyroid nodule: Secondary | ICD-10-CM | POA: Diagnosis not present

## 2023-01-10 DIAGNOSIS — I10 Essential (primary) hypertension: Secondary | ICD-10-CM | POA: Diagnosis not present

## 2023-01-11 ENCOUNTER — Ambulatory Visit: Payer: 59 | Attending: Neurosurgery

## 2023-01-11 DIAGNOSIS — M6281 Muscle weakness (generalized): Secondary | ICD-10-CM | POA: Insufficient documentation

## 2023-01-11 DIAGNOSIS — R262 Difficulty in walking, not elsewhere classified: Secondary | ICD-10-CM | POA: Diagnosis not present

## 2023-01-11 NOTE — Therapy (Cosign Needed)
OUTPATIENT PHYSICAL THERAPY NEURO TREATMENT   Patient Name: Susan [name] MRN: [id number] DOB:[birth year], 62 y.o., female Today's Date: 01/12/2023   PCP: [name] MD REFERRING PROVIDER: Venetia Night MD  END OF SESSION:  PT End of Session - 01/11/23 1502     Visit Number 3    Number of Visits 25    Date for PT Re-Evaluation 03/07/23    PT Start Time 1510    PT Stop Time 1554    PT Time Calculation (min) 44 min    Equipment Utilized During Treatment Gait belt    Activity Tolerance Patient tolerated treatment well    Behavior During Therapy [facility] for tasks assessed/performed             Past Medical History:  Diagnosis Date   ADD (attention deficit disorder)    Alcohol abuse    Anemia    Anxiety    Aortic atherosclerosis (HCC) 02/20/2018   Chest CT Sept 2019   Arthritis    rheumatoid arthritis   Asthma    Bipolar disorder (HCC)    Centrilobular emphysema (HCC)    Cocaine use disorder, moderate, in sustained remission (HCC)    Constipation    Degenerative disc disease at L5-S1 level 09/28/2016   See ortho note May 2018   Depression    bipolar, hx of suicide attempt   Diabetes mellitus, type 2 (HCC)    Diverticulitis 2013   DOE (dyspnea on exertion)    Drug use    Family history of adverse reaction to anesthesia    mom-delayed emergence   GERD (gastroesophageal reflux disease)    H/O suicide attempt    slit wrists   Hepatitis C 06/26/2014   treated with Harvoni   Hip pain    History of echocardiogram    a. 04/2019 Echo: EF >65%, nl RV fxn.   History of MRSA infection 2013   HLD (hyperlipidemia)    Hypertension    a. 06/2021 Renal Duplex: ? bilat RAS; b. 06/2021 CTA Abd: No signif RAS.   Hyponatremia    Hypothyroidism    Incisional hernia 11/08/2012   Knee pain    Left carotid bruit    Morbid obesity (HCC)    Multinodular thyroid    OSA (obstructive sleep apnea)    OSA on CPAP    Osteoporosis    Palpitations    Post-traumatic  osteoarthritis of right knee 09/24/2015   Recurrent ventral hernia 11/08/2012   Status post total right knee replacement using cement 05/10/2016   Stroke (HCC) 07/05/2022   right arm,torso and right leg numbness   Tobacco use disorder    Vitamin B12 deficiency    Vitamin D deficiency disease    Past Surgical History:  Procedure Laterality Date   BILATERAL SALPINGOOPHORECTOMY     due to abnormal mass   BREAST BIOPSY Left    neg   BREAST SURGERY Left 20 yrs ago   CESAREAN SECTION     COLONOSCOPY     COLONOSCOPY WITH PROPOFOL N/A 10/16/2017   Procedure: COLONOSCOPY WITH PROPOFOL;  Surgeon: Wyline Mood, MD;  Location: [facility] ENDOSCOPY;  Service: Gastroenterology;  Laterality: N/A;   COLONOSCOPY WITH PROPOFOL N/A 02/16/2021   Procedure: COLONOSCOPY WITH PROPOFOL;  Surgeon: Wyline Mood, MD;  Location: [name] ENDOSCOPY;  Service: Gastroenterology;  Laterality: N/A;   FORAMINOTOMY 1 LEVEL Right 10/10/2022   Procedure: RIGHT L4-5 LAMINOFORAMINOTOMY;  Surgeon: Venetia Night, MD;  Location: [facility] ORS;  Service: Neurosurgery;  Laterality: Right;  HERNIA REPAIR  06/2011, July 2014   Ventral wall repair with Physiomesh   HERNIA REPAIR     2nd.vental wall repair   JOINT REPLACEMENT Right    knee   LUMBAR LAMINECTOMY/ DECOMPRESSION WITH MET-RX N/A 10/12/2022   Procedure: LUMBAR LAMINECTOMY/ DECOMPRESSION WITH MET-RX;  Surgeon: Venetia Night, MD;  Location: [facility] ORS;  Service: Neurosurgery;  Laterality: N/A;   TONSILLECTOMY     TOTAL HIP ARTHROPLASTY Left 07/23/2019   Procedure: TOTAL HIP ARTHROPLASTY;  Surgeon: Christena Flake, MD;  Location: [facility] ORS;  Service: Orthopedics;  Laterality: Left;   TOTAL KNEE ARTHROPLASTY Right 05/10/2016   Procedure: TOTAL KNEE ARTHROPLASTY;  Surgeon: Christena Flake, MD;  Location: [facility] ORS;  Service: Orthopedics;  Laterality: Right;   TUBAL LIGATION     Patient Active Problem List   Diagnosis Date Noted   Blister of foot without infection, right, initial encounter  01/05/2023   Bipolar 1 disorder, depressed, moderate (HCC) 11/29/2022   Acute respiratory failure with hypoxia (HCC) 10/13/2022   AKI (acute kidney injury) (HCC) 10/13/2022   Anemia 10/13/2022   Cauda equina syndrome (HCC) 10/12/2022   Epidural hematoma (HCC) 10/12/2022   Mass in epidural space 10/12/2022   Synovial cyst of lumbar facet joint 10/10/2022   Lumbar radiculopathy 10/10/2022   S/P laminectomy 10/10/2022   Preop examination 09/28/2022   Epigastric pain 07/22/2022   Enlarged thyroid 07/22/2022   Chronic diarrhea 07/22/2022   CVA (cerebrovascular accident) (HCC) 07/05/2022   Skin lesion 06/28/2022   BMI 38.0-38.9,adult 07/14/2021   Type 2 diabetes mellitus without complication, without long-term current use of insulin (HCC) 06/16/2021   H/O thyroid nodule 05/31/2021   History of hepatitis C- treated 2014- 2015 with [facility] gastroenterology and followed.  05/19/2021   Left carotid bruit 04/28/2021   Anxiety 05/28/2020   Bipolar affective disorder in remission (HCC) 05/28/2020   Bipolar disorder, in full remission, most recent episode mixed (HCC) 03/06/2020   Need for immunization against influenza 01/27/2020   Routine physical examination 01/27/2020   Morbid obesity (HCC) 01/27/2020   Osteoporosis 11/06/2019   Bipolar 1 disorder, mixed, mild (HCC) 10/29/2019   PTSD (post-traumatic stress disorder) 09/27/2019   Bereavement 09/27/2019   Cocaine use disorder, moderate, in sustained remission (HCC) 09/27/2019   Status post total hip replacement, left 07/23/2019   Primary osteoarthritis of left hip 06/14/2019   Depression 10/23/2018   Allergic rhinitis 08/23/2018   Aortic atherosclerosis (HCC) 02/20/2018   Centrilobular emphysema (HCC) 02/20/2018   Hyperlipidemia 03/28/2017   Degenerative disc disease at L5-S1 level 09/28/2016   Tobacco use disorder 04/21/2016   GERD (gastroesophageal reflux disease) 12/17/2015   Chronic back pain 10/09/2015   Post-traumatic osteoarthritis  of right knee 09/24/2015   Alcohol use disorder, severe, in sustained remission (HCC) 04/13/2015   Vitamin D deficiency 04/09/2015   Vitamin B12 deficiency 04/09/2015   OSA on CPAP 04/09/2015   Subclinical hyperthyroidism 06/26/2014   Goiter colloid, toxic, nodular 06/26/2014   Hypertension 06/26/2014   Tobacco use disorder, continuous 06/26/2014   Bipolar I disorder (HCC) 06/26/2014   Lumbar radiculitis 10/03/2013   Diverticulosis 08/24/2013    ONSET DATE: 10/12/22  REFERRING DIAG: Z61.096 (ICD-10-CM) - Status post lumbar spine surgery for decompression of spinal cord G83.4 (ICD-10-CM) - Cauda equina syndrome (HCC)  THERAPY DIAG:  Muscle weakness (generalized)  Difficulty in walking, not elsewhere classified  Rationale for Evaluation and Treatment: Rehabilitation  SUBJECTIVE:  SUBJECTIVE STATEMENT: Pt reports 7/10 LBP at today's session. She states she has been HEP compliant and has fully recovered since having COVID last week.   Pt accompanied by: self  PERTINENT HISTORY:  Alyissa Shidler is a 62 y/o F with PMH: HTN, DM2, HLD, ETOH use, THA, PTSD, cocaine, bipolar, and L thalamic infarct (06/2022), s/p lumbar laminoforaminotomy (10/10/22). Presenting post L3-5 decompression with evacuation of epidural hematoma on 10/12/22 for Cauda Equina Syndrome. Pt has residual RLE deficits after her lumbar surgeries leading to ambulating with RW. She was independent prior to her surgeries now reporting balance and gait difficulties. Has had some Worsening RUE N/T since her CVA in February after her lumbar surgeries. Reports N/T 6-7/10 on severity. Mentions some falls when not using her RW. Feels like she always has to have a hand on something to steady herself.  Pt has residual RLE deficits after her lumbar surgeries  leading to ambulating with RW. She was independent prior to her surgeries now reporting balance and gait difficulties. Has had some Worsening RUE N/T since her CVA in February after her lumbar surgeries. Reports N/T 6-7/10 on severity. Mentions some falls when not using her RW. Feels like she always has to have a hand on something to steady herself. Has had some falls anterior standing too close in the RW, a fall walking up the stairs with the RW. She is able to get herself up. Pt describing some unsafe scenarios with poor RW use with transfers and asc/desc curbs.   PAIN:  Are you having pain? No  PRECAUTIONS: Back  RED FLAGS: Bowel or bladder incontinence: Yes: MD aware since surgery.     WEIGHT BEARING RESTRICTIONS: No  FALLS: Has patient fallen in last 6 months? Yes. Number of falls 3  LIVING ENVIRONMENT: Lives with: lives with their son   PLOF: Independent  PATIENT GOALS: Improve her walking and   OBJECTIVE:   DIAGNOSTIC FINDINGS: CLINICAL DATA:  Elective surgery   EXAM: LUMBAR SPINE - 2-3 VIEW   COMPARISON:  10/10/2022   FINDINGS: Intraoperative fluoroscopy is utilized for surgical control purposes. Fluoroscopy time is recorded at 6 seconds. Dose 5.33 mGy. Two spot fluoroscopic images are obtained. Initial image demonstrates a localization marker posterior to the L4 vertebra. Second image demonstrates a localization marker posterior to the L3-4 interspace level.   IMPRESSION: Intraoperative fluoroscopy utilized for surgical control purposes for lumbar spine localization.     Electronically Signed   By: Burman Nieves M.D.   On: 10/12/2022 20:24  COGNITION: Overall cognitive status: Within functional limits for tasks assessed   SENSATION: Light touch: Impaired  RLE: impaired L2, L4-L5  COORDINATION: Heel to shin normal bilaterally   PROPRIOCEPTION: Normal bilat   POSTURE:  Heavy BUE support on RW with standing tasks with bilateral upper trap  use   LUMBAR AROM: Flexion: 75% limited  Extension: 50% limited  Rotation R/L: 50% limited  Lateral flexion R/L: 50% limited  LOWER EXTREMITY AROM:  L ankle inversion: 20, eversion: 35  R ankle inversion: 15 , eversion: 25    LOWER EXTREMITY MMT:    MMT Right Eval Left Eval  Hip flexion 4- 4  Hip extension    Hip abduction    Hip adduction    Hip internal rotation    Hip external rotation    Knee flexion 4- 4+  Knee extension 4 4+  Ankle dorsiflexion 4- 4+  Ankle plantarflexion    Ankle inversion 3 4+  Ankle eversion 3 4-  (  Blank rows = not tested)  TRANSFERS: Assistive device utilized: Environmental consultant - 2 wheeled  Sit to stand: Modified independence Stand to sit: Modified independence  *poor hand placement keeping BUE's on RW for STS and stand to sit despite VC's and education for safe hand placement.   GAIT: Gait pattern: step through pattern, decreased stance time- Right, decreased ankle dorsiflexion- Right, and poor foot clearance- Right. Notable R ankle inversion in stance phase due to evertor weakness Distance walked: 10 meters Assistive device utilized: Walker - 2 wheeled Level of assistance: Modified independence Comments: Heavy BUE support on RW. PT adjusting RW height to reduced B shoulder shrug.   FUNCTIONAL TESTS:  5 times sit to stand: 29.37 seconds Timed up and go (TUG): 26.89 seconds 10 meter walk test: 0.48 m/s with RW  PATIENT SURVEYS:  FOTO 37 with target score of 43  TODAY'S TREATMENT:                                                                                                                              DATE: 01/11/23  Nustep x27min for increased lumbar mobility and LE strengthening   STS from standard height chair: 1x10 no UE support, 2 x10 w/ L foot staggered in front of R foot bilat UE support for push- off.   Standing alt marches at [employer]: 3 x30sec, supervision at [facility]   Lateral steps w/ RTB around distal thighs 4x6', CGA  Seated ankle  heel to toe raises: 2x12/side  Supine glute bridges w/ GTB around distal thighs 2x10   Supine LTR's x15/ each side  Seated blue physioball rollouts forward, Rt lateral flexion, Lt lateral flexion x10/ each direction   Seated thoracic ext w/ half bolster in chair x10, 5 sec hold   GAIT:  Ambulating w/ RW x40'. v/c for proper thoracic positioning to prevent rounded shoulders, and to increase step length.   PATIENT EDUCATION: Education details: HEP, safe use of AD, POC Person educated: Patient Education method: Explanation, Demonstration, Verbal cues, and Handouts Education comprehension: verbalized understanding and needs further education  HOME EXERCISE PROGRAM: Access Code: [email or web address] URL: [email or web address] Date: 12/13/2022 Prepared by: Ronnie Derby  Exercises - Supine Bridge  - 1 x daily - 7 x weekly - 3 sets - 6 reps - Seated Scapular Retraction  - 1 x daily - 7 x weekly - 3 sets - 15 reps - Seated Ankle Circles  - 1 x daily - 7 x weekly - 3 sets - 15 reps  GOALS: Goals reviewed with patient? No  SHORT TERM GOALS: Target date: 12/24/22  Pt will be independent with HEP to improve RLE strength for transfers and gait Baseline: 12/13/22: Provided initial HEP Goal status: INITIAL  2.  Pt will demonstrate safe use of hand placement with transfers with LRAD to demonstrate reduced falls risk. Baseline: 12/13/22: BUE use on AD with STS and stand to sit transfers Goal status: INITIAL  LONG TERM GOALS: Target date: 03/07/23  Pt will improve FOTO to target score to demonstrate clinically significant improvement in functional mobility.  Baseline: 12/13/22: 37/43 Goal status: INITIAL  2.  Pt will complete TUG in 12 seconds or less with LRAD to demonstrate reduced falls risk with transfers and short distance gait Baseline: 12/13/22: 26.89 seconds Goal status: INITIAL  3.  Pt will improve 5xSTS to 11.4 seconds or less without UE support to demonstrate improved LE  strength to match age based norms.  Baseline: : 12/13/22: 29.37 seconds Goal status: INITIAL  4.  Pt will improve FGA by at least 4 points to demonstrate clinically significant improvement in balance with community activities.  Baseline: 12/13/22: deferred to next session; 12/29/22: 14/30 Goal status: INITIAL  5.  Pt will improve gait speed to at least 1.0 m/s with LRAD to demonstrate safe ability to complete short community distances. Baseline: 12/13/22: 0.48 m/s with RW Goal status: INITIAL  ASSESSMENT:  CLINICAL IMPRESSION: Session focused on progressing pt's bilat LE strengthening exercises along w/ improving thoraco- lumbar mobility. Pt notes improvements in LE strength noted w/ improved activity tolerance during exercises at today's session. Pt continues to address LBP and weakness of the RLE> LLE noted w/ pt reported functional tasks. Pt continues to require VC's for safety awareness with use of RW to reduce falls risk still relying on RW with walking tasks due to RLE weakness. Pt would continue to benefit from skilled PT interventions to address remaining RLE strength, pain and balance deficits to maximize return to independence and reduce falls risk.  OBJECTIVE IMPAIRMENTS: Abnormal gait, decreased balance, decreased mobility, difficulty walking, decreased ROM, decreased strength, impaired sensation, and postural dysfunction.   ACTIVITY LIMITATIONS: carrying, lifting, bending, standing, stairs, transfers, reach over head, and locomotion level  PARTICIPATION LIMITATIONS: cleaning, laundry, driving, shopping, and community activity  PERSONAL FACTORS: Age, Past/current experiences, and Time since onset of injury/illness/exacerbation are also affecting patient's functional outcome.   REHAB POTENTIAL: Good  CLINICAL DECISION MAKING: Evolving/moderate complexity  EVALUATION COMPLEXITY: Moderate  PLAN:  PT FREQUENCY: 2x/week  PT DURATION: 12 weeks  PLANNED INTERVENTIONS: Therapeutic  exercises, Therapeutic activity, Neuromuscular re-education, Balance training, Gait training, Patient/Family education, Self Care, Stair training, DME instructions, Manual therapy, and Re-evaluation  PLAN FOR NEXT SESSION: , RLE strength tasks. Progress gait and balance with LRAD.   Lovie Macadamia, SPT  Delphia Grates. Fairly IV, PT, DPT Physical Therapist- [facility]  01/12/2023, 8:38 AM

## 2023-01-12 ENCOUNTER — Ambulatory Visit: Payer: 59

## 2023-01-12 ENCOUNTER — Telehealth: Payer: Self-pay

## 2023-01-12 ENCOUNTER — Telehealth (INDEPENDENT_AMBULATORY_CARE_PROVIDER_SITE_OTHER): Payer: 59 | Admitting: Psychiatry

## 2023-01-12 ENCOUNTER — Encounter: Payer: Self-pay | Admitting: Psychiatry

## 2023-01-12 DIAGNOSIS — F4310 Post-traumatic stress disorder, unspecified: Secondary | ICD-10-CM | POA: Diagnosis not present

## 2023-01-12 DIAGNOSIS — F1021 Alcohol dependence, in remission: Secondary | ICD-10-CM

## 2023-01-12 DIAGNOSIS — R262 Difficulty in walking, not elsewhere classified: Secondary | ICD-10-CM | POA: Diagnosis not present

## 2023-01-12 DIAGNOSIS — M6281 Muscle weakness (generalized): Secondary | ICD-10-CM

## 2023-01-12 DIAGNOSIS — F3176 Bipolar disorder, in full remission, most recent episode depressed: Secondary | ICD-10-CM | POA: Diagnosis not present

## 2023-01-12 DIAGNOSIS — F17200 Nicotine dependence, unspecified, uncomplicated: Secondary | ICD-10-CM

## 2023-01-12 DIAGNOSIS — F1421 Cocaine dependence, in remission: Secondary | ICD-10-CM

## 2023-01-12 MED ORDER — LURASIDONE HCL 20 MG PO TABS: 20.0000 mg | ORAL_TABLET | Freq: Every day | ORAL | 3 refills | Status: DC

## 2023-01-12 NOTE — Telephone Encounter (Signed)
Patient states she is returning a call from [employer].  I transferred call to Rasheedah.

## 2023-01-12 NOTE — Progress Notes (Signed)
Virtual Visit via Video Note  I connected with Susan [name] on 01/12/23 at  1:30 PM EDT by a video enabled telemedicine application and verified that I am speaking with the correct person using two identifiers.  Location Provider Location : [facility] Patient Location : Home  Participants: Patient , Provider    I discussed the limitations of evaluation and management by telemedicine and the availability of in person appointments. The patient expressed understanding and agreed to proceed.   I discussed the assessment and treatment plan with the patient. The patient was provided an opportunity to ask questions and all were answered. The patient agreed with the plan and demonstrated an understanding of the instructions.   The patient was advised to call back or seek an in-person evaluation if the symptoms worsen or if the condition fails to improve as anticipated.   BH MD OP Progress Note  01/13/2023 8:57 AM Susan [name]  MRN:  [id number]  Chief Complaint:  Chief Complaint  Patient presents with   Follow-up   Anxiety   Depression   Medication Refill   HPI: Susan [name] is a 62 year old Caucasian female, widowed, employed, lives in [place], has a history of bipolar disorder, PTSD, alcohol and cocaine use disorder in remission, tobacco use disorder, bereavement, degenerative disk disease, obstructive sleep apnea on CPAP, history of total hip replacement, osteoarthritis, hepatitis C was evaluated by telemedicine today.  Patient today reports she is currently tolerating the Latuda 20 mg well.  She is compliant.  She reports her mood symptoms as improved on the current medication regimen.  Denies any significant mood swings, sadness, anhedonia, anxiety symptoms.  Patient reports sleep is improved.  Latuda helps with sleep as well.  Patient reports she was able to take a vacation to [place] for 7 days.  She had to use a walker to walk otherwise she enjoyed it.  Patient denies any  suicidality, homicidality or perceptual disturbances.  Patient continues to stay sober from alcohol and plans to go back to AA meetings more frequently when she is able to find more time.  Patient reports she is currently in physical therapy and that takes up a lot of her time.  Patient denies any other concerns today.  Visit Diagnosis:    ICD-10-CM   1. Bipolar disorder, in full remission, most recent episode depressed (HCC)  F31.76 lurasidone (LATUDA) 20 MG TABS tablet   Type I    2. PTSD (post-traumatic stress disorder)  F43.10     3. Tobacco use disorder  F17.200     4. Alcohol use disorder, severe, in sustained remission (HCC)  F10.21     5. Cocaine use disorder, moderate, in sustained remission (HCC)  F14.21       Past Psychiatric History: I have reviewed past psychiatric history from progress note on 09/27/2019.  Past trials of Seroquel, Cymbalta, Abilify, risperidone.  Past Medical History:  Past Medical History:  Diagnosis Date   ADD (attention deficit disorder)    Alcohol abuse    Anemia    Anxiety    Aortic atherosclerosis (HCC) 02/20/2018   Chest CT Sept 2019   Arthritis    rheumatoid arthritis   Asthma    Bipolar disorder (HCC)    Centrilobular emphysema (HCC)    Cocaine use disorder, moderate, in sustained remission (HCC)    Constipation    Degenerative disc disease at L5-S1 level 09/28/2016   See ortho note May 2018   Depression    bipolar, hx  of suicide attempt   Diabetes mellitus, type 2 (HCC)    Diverticulitis 2013   DOE (dyspnea on exertion)    Drug use    Family history of adverse reaction to anesthesia    mom-delayed emergence   GERD (gastroesophageal reflux disease)    H/O suicide attempt    slit wrists   Hepatitis C 06/26/2014   treated with Harvoni   Hip pain    History of echocardiogram    a. 04/2019 Echo: EF >65%, nl RV fxn.   History of MRSA infection 2013   HLD (hyperlipidemia)    Hypertension    a. 06/2021 Renal Duplex: ? bilat  RAS; b. 06/2021 CTA Abd: No signif RAS.   Hyponatremia    Hypothyroidism    Incisional hernia 11/08/2012   Knee pain    Left carotid bruit    Morbid obesity (HCC)    Multinodular thyroid    OSA (obstructive sleep apnea)    OSA on CPAP    Osteoporosis    Palpitations    Post-traumatic osteoarthritis of right knee 09/24/2015   Recurrent ventral hernia 11/08/2012   Status post total right knee replacement using cement 05/10/2016   Stroke (HCC) 07/05/2022   right arm,torso and right leg numbness   Tobacco use disorder    Vitamin B12 deficiency    Vitamin D deficiency disease     Past Surgical History:  Procedure Laterality Date   BILATERAL SALPINGOOPHORECTOMY     due to abnormal mass   BREAST BIOPSY Left    neg   BREAST SURGERY Left 20 yrs ago   CESAREAN SECTION     COLONOSCOPY     COLONOSCOPY WITH PROPOFOL N/A 10/16/2017   Procedure: COLONOSCOPY WITH PROPOFOL;  Surgeon: Wyline Mood, MD;  Location[facility] ENDOSCOPY;  Service: Gastroenterology;  Laterality: N/A;   COLONOSCOPY WITH PROPOFOL N/A 02/16/2021   Procedure: COLONOSCOPY WITH PROPOFOL;  Surgeon: Wyline Mood, MD;  [facility]: [facility] ENDOSCOPY;  Service: Gastroenterology;  Laterality: N/A;   FORAMINOTOMY 1 LEVEL Right 10/10/2022   Procedure: RIGHT L4-5 LAMINOFORAMINOTOMY;  Surgeon: Venetia Night, MD;  Location: [facility] ORS;  Service: Neurosurgery;  Laterality: Right;   HERNIA REPAIR  06/2011, July 2014   Ventral wall repair with Physiomesh   HERNIA REPAIR     2nd.vental wall repair   JOINT REPLACEMENT Right    knee   LUMBAR LAMINECTOMY/ DECOMPRESSION WITH MET-RX N/A 10/12/2022   Procedure: LUMBAR LAMINECTOMY/ DECOMPRESSION WITH MET-RX;  Surgeon: Venetia Night, MD;  Location: [facility] ORS;  Service: Neurosurgery;  Laterality: N/A;   TONSILLECTOMY     TOTAL HIP ARTHROPLASTY Left 07/23/2019   Procedure: TOTAL HIP ARTHROPLASTY;  Surgeon: Christena Flake, MD;  Location: [facility] ORS;  Service: Orthopedics;  Laterality: Left;   TOTAL KNEE  ARTHROPLASTY Right 05/10/2016   Procedure: TOTAL KNEE ARTHROPLASTY;  Surgeon: Christena Flake, MD;  Location: [facility] ORS;  Service: Orthopedics;  Laterality: Right;   TUBAL LIGATION      Family Psychiatric History: I have reviewed family psychiatric history from progress note on 09/27/2019.  Family History:  Family History  Problem Relation Age of Onset   Arthritis Mother    Asthma Mother    Mental illness Mother    Thyroid disease Mother    COPD Mother    Heart disease Mother    Congestive Heart Failure Mother    Alcohol abuse Mother    Eating disorder Mother    Bipolar disorder Mother    Alcohol abuse Father  Heart attack Father    Alcohol abuse Sister    Drug abuse Sister    Mental illness Sister    Mental illness Sister    Fibromyalgia Sister    Obesity Sister    Pneumonia Sister    Depression Sister    Mental illness Sister    Alcohol abuse Sister    Drug abuse Sister    Arthritis Brother    Mental illness Brother    Cancer Brother        non-hodkins lymphoma   Drug abuse Daughter    Drug abuse Son    Alcohol abuse Son    Drug abuse Son    Alcohol abuse Son    Diabetes Neg Hx    Stroke Neg Hx    Breast cancer Neg Hx    Thyroid cancer Neg Hx     Social History: I have reviewed social history from progress note on 09/27/2019. Social History   Socioeconomic History   Marital status: Widowed    Spouse name: Not on file   Number of children: 3   Years of education: Not on file   Highest education level: Some college, no degree  Occupational History   Occupation: disabled  Tobacco Use   Smoking status: Every Day    Current packs/day: 0.50    Average packs/day: 0.5 packs/day for 41.0 years (20.5 ttl pk-yrs)    Types: Cigarettes   Smokeless tobacco: Never  Vaping Use   Vaping status: Never Used  Substance and Sexual Activity   Alcohol use: No    Alcohol/week: 0.0 standard drinks of 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   Drug use: No    Comment:  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   Sexual activity: Not Currently  Other Topics Concern   Not on file  Social History Narrative   Not on file   Social Determinants of Health   Financial Resource Strain: Low Risk  (06/02/2021)   Overall Financial Resource Strain (CARDIA)    Difficulty of Paying Living Expenses: Not hard at all  Food Insecurity: No Food Insecurity (10/24/2022)   Hunger Vital Sign    Worried About Running Out of Food in the Last Year: Never true    Ran Out of Food in the Last Year: Never true  Transportation Needs: No Transportation Needs (10/24/2022)   PRAPARE - Administrator, Civil Service (Medical): No    Lack of Transportation (Non-Medical): No  Physical Activity: Sufficiently Active (06/02/2021)   Exercise Vital Sign    Days of Exercise per Week: 5 days    Minutes of Exercise per Session: 30 min  Stress: No Stress Concern Present (06/02/2021)   [employer] of Occupational Health - Occupational Stress Questionnaire    Feeling of Stress : Not at all  Social Connections: Moderately Isolated (06/02/2021)   Social Connection and Isolation Panel [NHANES]    Frequency of Communication with Friends and Family: More than three times a week    Frequency of Social Gatherings with Friends and Family: Three times a week    Attends Religious Services: Never    Active Member of Clubs or Organizations: Yes    Attends Banker Meetings: More than 4 times per year    Marital Status: Widowed    Allergies:  Allergies  Allergen Reactions   Wellbutrin [Bupropion]     Patient reports made " deathly sick " years ago at appointment 03/18/20.   Lasix [  Furosemide] Other (See Comments)    Electrolyte imbalance    Metabolic Disorder Labs: Lab Results  Component Value Date   HGBA1C 5.9 06/28/2022   MPG 108 04/08/2019   MPG 103 08/16/2017   No results found for: "PROLACTIN" Lab Results  Component Value Date   CHOL 211 (H) 07/06/2022    TRIG 166 (H) 07/06/2022   HDL 50 07/06/2022   CHOLHDL 4.2 07/06/2022   VLDL 33 07/06/2022   LDLCALC 128 (H) 07/06/2022   LDLCALC 108 (H) 06/28/2022   Lab Results  Component Value Date   TSH 1.15 04/28/2021   TSH 1.09 10/16/2019    Therapeutic Level Labs: No results found for: "LITHIUM" No results found for: "VALPROATE" No results found for: "CBMZ"  Current Medications: Current Outpatient Medications  Medication Sig Dispense Refill   lurasidone (LATUDA) 20 MG TABS tablet Take 1 tablet (20 mg total) by mouth daily with supper. 30 tablet 3   acetaminophen (TYLENOL) 500 MG tablet Take 1,000 mg by mouth 2 (two) times daily.     albuterol (VENTOLIN HFA) 108 (90 Base) MCG/ACT inhaler Inhale 2 puffs into the lungs every 6 (six) hours as needed for wheezing or shortness of breath. 8 g 2   carvedilol (COREG) 12.5 MG tablet Take 12.5 mg by mouth 2 (two) times daily.     [START ON 01/16/2023] celecoxib (CELEBREX) 100 MG capsule Take 1 capsule (100 mg total) by mouth 2 (two) times daily. 60 capsule 0   clotrimazole (LOTRIMIN) 1 % cream Apply 1 application  topically 2 (two) times daily as needed.     cyclobenzaprine (FLEXERIL) 5 MG tablet Take 5 mg by mouth 2 (two) times daily.     DULoxetine (CYMBALTA) 20 MG capsule TAKE ONE CAPSULE BY MOUTH DAILY WITH 30 MG 30 capsule 3   DULoxetine (CYMBALTA) 30 MG capsule TAKE ONE CAPSULE BY MOUTH DAILY with 20 mg cap. 30 capsule 3   gabapentin (NEURONTIN) 300 MG capsule Take 600 mg by mouth 2 (two) times daily.      HYDROcodone-acetaminophen (NORCO/VICODIN) 5-325 MG tablet Take 1 tablet by mouth daily as needed for moderate pain.     linaclotide (LINZESS) 145 MCG CAPS capsule Take 145 mcg by mouth daily before breakfast.     OZEMPIC, 1 MG/DOSE, 4 MG/3ML SOPN INJECT 1MG  UNDER THE SKIN (SUBCUTANEOUSLY) ONCE WEEKLY ON SUNDAY 3 mL 1   rosuvastatin (CRESTOR) 20 MG tablet Take 1 tablet (20 mg total) by mouth daily. 90 tablet 3   spironolactone (ALDACTONE) 25 MG  tablet Take 1 tablet (25 mg total) by mouth daily. 90 tablet 1   telmisartan (MICARDIS) 80 MG tablet TAKE ONE TABLET BY MOUTH DAILY 90 tablet 1   traZODone (DESYREL) 50 MG tablet TAKE 1 TABLET(50 MG) BY MOUTH AT BEDTIME 30 tablet 1   No current facility-administered medications for this visit.     Musculoskeletal: Strength & Muscle Tone:  UTA Gait & Station:  Seated Patient leans: N/A  Psychiatric Specialty Exam: Review of Systems  Psychiatric/Behavioral: Negative.      There were no vitals taken for this visit.There is no height or weight on file to calculate BMI.  General Appearance: Fairly Groomed  Eye Contact:  Good  Speech:  Clear and Coherent  Volume:  Normal  Mood:  Euthymic  Affect:  Congruent  Thought Process:  Goal Directed and Descriptions of Associations: Intact  Orientation:  Full (Time, Place, and Person)  Thought Content: Logical   Suicidal Thoughts:  No  Homicidal Thoughts:  No  Memory:  Immediate;   Fair Recent;   Fair Remote;   Fair  Judgement:  Fair  Insight:  Fair  Psychomotor Activity:  Normal  Concentration:  Concentration: Fair and Attention Span: Fair  Recall:  [employer] of Knowledge: Fair  Language: Fair  Akathisia:  No  Handed:  Right  AIMS (if indicated): not done  Assets:  Communication Skills Desire for Improvement Housing Social Support  ADL's:  Intact  Cognition: WNL  Sleep:  Fair   Screenings: AIMS    Flowsheet Row Video Visit from 03/24/2022 in [facility] Video Visit from 01/20/2022 in [name] Video Visit from 10/21/2021 in [facility]  AIMS Total Score 0 0 0      GAD-7    Flowsheet Row Office Visit from 12/02/2022 in [facility] [employer] Visit from 11/21/2022 in [facility] [employer] Visit from 09/28/2022 in [facility]  [place] [facility] [employer] Visit from 08/31/2022 in [facility] Office Visit from [facility]/28/2022 in [facility] [facility]  Total GAD-7 Score 6 2 4 4 3       PHQ2-9    Flowsheet Row Office Visit from 12/28/2022 in [facility] Office Visit from 12/02/2022 in [facility] Video Visit from 11/29/2022 in [facility] Office Visit from 11/21/2022 in [facility] Visit from 09/28/2022 in [facility] Total Score 2 2 2 2 1   PHQ-9 Total Score 4 8 8 6 3       Flowsheet Row Video Visit from 01/12/2023 in [facility] Video Visit from 11/29/2022 in [facility] ED from 11/05/2022 in [facility] at [facility]  C-SSRS RISK CATEGORY Low Risk Low Risk No Risk        Assessment and Plan: Susan [name] is a 62 year old Caucasian female who has a history of bipolar disorder, GAD, degenerative disc disease, history of recent CVA, multiple medical problems including recent back surgery-L3-L5 with complications, was evaluated by telemedicine today.  Patient is currently stable on the current medication regimen, plan as noted below.  Plan Bipolar disorder type I depressed in remission Latuda 20 mg p.o. daily with supper Cymbalta 50 mg p.o. daily Gabapentin as prescribed although for pain it is also a mood stabilizer.  PTSD-stable Cymbalta 50 mg p.o. daily  Tobacco use disorder-improving Patient to continue to cut back.  Alcohol use disorder/cocaine use disorder in remission Continue AA meetings.  Patient has been sober since the past 5 years.   Consent: Patient/Guardian gives verbal consent for treatment and  assignment of benefits for services provided during this visit. Patient/Guardian expressed understanding and agreed to proceed.   Follow-up in clinic in 3 months or sooner if needed.  This note was generated in part or whole with voice recognition software. Voice recognition is usually quite accurate but there are transcription errors that can and very often do occur. I apologize for any typographical errors that were not detected and corrected.    Jomarie Longs, MD 01/13/2023, 8:57 AM

## 2023-01-12 NOTE — Therapy (Addendum)
OUTPATIENT PHYSICAL THERAPY NEURO TREATMENT   Patient Name: Susan [name] MRN: [id number] DOB:07-05-1960, 62 y.o., female Today's Date: 01/12/2023   PCP: [name] MD REFERRING PROVIDER: Venetia Night MD  END OF SESSION:  PT End of Session - 01/12/23 1026     Visit Number 4    Number of Visits 25    Date for PT Re-Evaluation 03/07/23    PT Start Time 1028    PT Stop Time 1111    PT Time Calculation (min) 43 min    Equipment Utilized During Treatment Gait belt    Activity Tolerance Patient tolerated treatment well    Behavior During Therapy WFL for tasks assessed/performed             Past Medical History:  Diagnosis Date   ADD (attention deficit disorder)    Alcohol abuse    Anemia    Anxiety    Aortic atherosclerosis (HCC) 02/20/2018   Chest CT Sept 2019   Arthritis    rheumatoid arthritis   Asthma    Bipolar disorder (HCC)    Centrilobular emphysema (HCC)    Cocaine use disorder, moderate, in sustained remission (HCC)    Constipation    Degenerative disc disease at L5-S1 level 09/28/2016   See ortho note May 2018   Depression    bipolar, hx of suicide attempt   Diabetes mellitus, type 2 (HCC)    Diverticulitis 2013   DOE (dyspnea on exertion)    Drug use    Family history of adverse reaction to anesthesia    mom-delayed emergence   GERD (gastroesophageal reflux disease)    H/O suicide attempt    slit wrists   Hepatitis C 06/26/2014   treated with Harvoni   Hip pain    History of echocardiogram    a. 04/2019 Echo: EF >65%, nl RV fxn.   History of MRSA infection 2013   HLD (hyperlipidemia)    Hypertension    a. 06/2021 Renal Duplex: ? bilat RAS; b. 06/2021 CTA Abd: No signif RAS.   Hyponatremia    Hypothyroidism    Incisional hernia 11/08/2012   Knee pain    Left carotid bruit    Morbid obesity (HCC)    Multinodular thyroid    OSA (obstructive sleep apnea)    OSA on CPAP    Osteoporosis    Palpitations    Post-traumatic  osteoarthritis of right knee 09/24/2015   Recurrent ventral hernia 11/08/2012   Status post total right knee replacement using cement 05/10/2016   Stroke (HCC) 07/05/2022   right arm,torso and right leg numbness   Tobacco use disorder    Vitamin B12 deficiency    Vitamin D deficiency disease    Past Surgical History:  Procedure Laterality Date   BILATERAL SALPINGOOPHORECTOMY     due to abnormal mass   BREAST BIOPSY Left    neg   BREAST SURGERY Left 20 yrs ago   CESAREAN SECTION     COLONOSCOPY     COLONOSCOPY WITH PROPOFOL N/A 10/16/2017   Procedure: COLONOSCOPY WITH PROPOFOL;  Surgeon: Wyline Mood, MD;  Location: [facility] ENDOSCOPY;  Service: Gastroenterology;  Laterality: N/A;   COLONOSCOPY WITH PROPOFOL N/A 02/16/2021   Procedure: COLONOSCOPY WITH PROPOFOL;  Surgeon: Wyline Mood, MD;  Location: [facility] ENDOSCOPY;  Service: Gastroenterology;  Laterality: N/A;   FORAMINOTOMY 1 LEVEL Right 10/10/2022   Procedure: RIGHT L4-5 LAMINOFORAMINOTOMY;  Surgeon: Venetia Night, MD;  Location: [facility] ORS;  Service: Neurosurgery;  Laterality: Right;  HERNIA REPAIR  06/2011, July 2014   Ventral wall repair with Physiomesh   HERNIA REPAIR     2nd.vental wall repair   JOINT REPLACEMENT Right    knee   LUMBAR LAMINECTOMY/ DECOMPRESSION WITH MET-RX N/A 10/12/2022   Procedure: LUMBAR LAMINECTOMY/ DECOMPRESSION WITH MET-RX;  Surgeon: Venetia Night, MD;  Location: [facility] ORS;  Service: Neurosurgery;  Laterality: N/A;   TONSILLECTOMY     TOTAL HIP ARTHROPLASTY Left 07/23/2019   Procedure: TOTAL HIP ARTHROPLASTY;  Surgeon: Christena Flake, MD;  Location: [facility] ORS;  Service: Orthopedics;  Laterality: Left;   TOTAL KNEE ARTHROPLASTY Right 05/10/2016   Procedure: TOTAL KNEE ARTHROPLASTY;  Surgeon: Christena Flake, MD;  Location: [facility] ORS;  Service: Orthopedics;  Laterality: Right;   TUBAL LIGATION     Patient Active Problem List   Diagnosis Date Noted   Blister of foot without infection, right, initial encounter  01/05/2023   Bipolar 1 disorder, depressed, moderate (HCC) 11/29/2022   Acute respiratory failure with hypoxia (HCC) 10/13/2022   AKI (acute kidney injury) (HCC) 10/13/2022   Anemia 10/13/2022   Cauda equina syndrome (HCC) 10/12/2022   Epidural hematoma (HCC) 10/12/2022   Mass in epidural space 10/12/2022   Synovial cyst of lumbar facet joint 10/10/2022   Lumbar radiculopathy 10/10/2022   S/P laminectomy 10/10/2022   Preop examination 09/28/2022   Epigastric pain 07/22/2022   Enlarged thyroid 07/22/2022   Chronic diarrhea 07/22/2022   CVA (cerebrovascular accident) (HCC) 07/05/2022   Skin lesion 06/28/2022   BMI 38.0-38.9,adult 07/14/2021   Type 2 diabetes mellitus without complication, without long-term current use of insulin (HCC) 06/16/2021   H/O thyroid nodule 05/31/2021   History of hepatitis C- treated 2014- 2015 with [facility] gastroenterology and followed.  05/19/2021   Left carotid bruit 04/28/2021   Anxiety 05/28/2020   Bipolar affective disorder in remission (HCC) 05/28/2020   Bipolar disorder, in full remission, most recent episode mixed (HCC) 03/06/2020   Need for immunization against influenza 01/27/2020   Routine physical examination 01/27/2020   Morbid obesity (HCC) 01/27/2020   Osteoporosis 11/06/2019   Bipolar 1 disorder, mixed, mild (HCC) 10/29/2019   PTSD (post-traumatic stress disorder) 09/27/2019   Bereavement 09/27/2019   Cocaine use disorder, moderate, in sustained remission (HCC) 09/27/2019   Status post total hip replacement, left 07/23/2019   Primary osteoarthritis of left hip 06/14/2019   Depression 10/23/2018   Allergic rhinitis 08/23/2018   Aortic atherosclerosis (HCC) 02/20/2018   Centrilobular emphysema (HCC) 02/20/2018   Hyperlipidemia 03/28/2017   Degenerative disc disease at L5-S1 level 09/28/2016   Tobacco use disorder 04/21/2016   GERD (gastroesophageal reflux disease) 12/17/2015   Chronic back pain 10/09/2015   Post-traumatic osteoarthritis  of right knee 09/24/2015   Alcohol use disorder, severe, in sustained remission (HCC) 04/13/2015   Vitamin D deficiency 04/09/2015   Vitamin B12 deficiency 04/09/2015   OSA on CPAP 04/09/2015   Subclinical hyperthyroidism 06/26/2014   Goiter colloid, toxic, nodular 06/26/2014   Hypertension 06/26/2014   Tobacco use disorder, continuous 06/26/2014   Bipolar I disorder (HCC) 06/26/2014   Lumbar radiculitis 10/03/2013   Diverticulosis 08/24/2013    ONSET DATE: 10/12/22  REFERRING DIAG: N62.952 (ICD-10-CM) - Status post lumbar spine surgery for decompression of spinal cord G83.4 (ICD-10-CM) - Cauda equina syndrome (HCC)  THERAPY DIAG:  Muscle weakness (generalized)  Difficulty in walking, not elsewhere classified  Rationale for Evaluation and Treatment: Rehabilitation  SUBJECTIVE:  SUBJECTIVE STATEMENT: Pt reports 7/10 LBP. She states that she was a little sore after last session yesterday.    Pt accompanied by: self  PERTINENT HISTORY:  Jacora Kinnon is a 62 y/o F with PMH: HTN, DM2, HLD, ETOH use, THA, PTSD, cocaine, bipolar, and L thalamic infarct (06/2022), s/p lumbar laminoforaminotomy (10/10/22). Presenting post L3-5 decompression with evacuation of epidural hematoma on 10/12/22 for Cauda Equina Syndrome. Pt has residual RLE deficits after her lumbar surgeries leading to ambulating with RW. She was independent prior to her surgeries now reporting balance and gait difficulties. Has had some Worsening RUE N/T since her CVA in February after her lumbar surgeries. Reports N/T 6-7/10 on severity. Mentions some falls when not using her RW. Feels like she always has to have a hand on something to steady herself.  Pt has residual RLE deficits after her lumbar surgeries leading to ambulating with RW. She was  independent prior to her surgeries now reporting balance and gait difficulties. Has had some Worsening RUE N/T since her CVA in February after her lumbar surgeries. Reports N/T 6-7/10 on severity. Mentions some falls when not using her RW. Feels like she always has to have a hand on something to steady herself. Has had some falls anterior standing too close in the RW, a fall walking up the stairs with the RW. She is able to get herself up. Pt describing some unsafe scenarios with poor RW use with transfers and asc/desc curbs.   PAIN:  Are you having pain? No  PRECAUTIONS: Back  RED FLAGS: Bowel or bladder incontinence: Yes: MD aware since surgery.     WEIGHT BEARING RESTRICTIONS: No  FALLS: Has patient fallen in last 6 months? Yes. Number of falls 3  LIVING ENVIRONMENT: Lives with: lives with their son   PLOF: Independent  PATIENT GOALS: Improve her walking and   OBJECTIVE:   DIAGNOSTIC FINDINGS: CLINICAL DATA:  Elective surgery   EXAM: LUMBAR SPINE - 2-3 VIEW   COMPARISON:  10/10/2022   FINDINGS: Intraoperative fluoroscopy is utilized for surgical control purposes. Fluoroscopy time is recorded at 6 seconds. Dose 5.33 mGy. Two spot fluoroscopic images are obtained. Initial image demonstrates a localization marker posterior to the L4 vertebra. Second image demonstrates a localization marker posterior to the L3-4 interspace level.   IMPRESSION: Intraoperative fluoroscopy utilized for surgical control purposes for lumbar spine localization.     Electronically Signed   By: Burman Nieves M.D.   On: 10/12/2022 20:24  COGNITION: Overall cognitive status: Within functional limits for tasks assessed   SENSATION: Light touch: Impaired  RLE: impaired L2, L4-L5  COORDINATION: Heel to shin normal bilaterally   PROPRIOCEPTION: Normal bilat   POSTURE:  Heavy BUE support on RW with standing tasks with bilateral upper trap use   LUMBAR AROM: Flexion: 75% limited   Extension: 50% limited  Rotation R/L: 50% limited  Lateral flexion R/L: 50% limited  LOWER EXTREMITY AROM:  L ankle inversion: 20, eversion: 35  R ankle inversion: 15 , eversion: 25    LOWER EXTREMITY MMT:    MMT Right Eval Left Eval  Hip flexion 4- 4  Hip extension    Hip abduction    Hip adduction    Hip internal rotation    Hip external rotation    Knee flexion 4- 4+  Knee extension 4 4+  Ankle dorsiflexion 4- 4+  Ankle plantarflexion    Ankle inversion 3 4+  Ankle eversion 3 4-  (Blank rows = not tested)  TRANSFERS: Assistive device utilized: Environmental consultant - 2 wheeled  Sit to stand: Modified independence Stand to sit: Modified independence  *poor hand placement keeping BUE's on RW for STS and stand to sit despite VC's and education for safe hand placement.   GAIT: Gait pattern: step through pattern, decreased stance time- Right, decreased ankle dorsiflexion- Right, and poor foot clearance- Right. Notable R ankle inversion in stance phase due to evertor weakness Distance walked: 10 meters Assistive device utilized: Walker - 2 wheeled Level of assistance: Modified independence Comments: Heavy BUE support on RW. PT adjusting RW height to reduced B shoulder shrug.   FUNCTIONAL TESTS:  5 times sit to stand: 29.37 seconds Timed up and go (TUG): 26.89 seconds 10 meter walk test: 0.48 m/s with RW  PATIENT SURVEYS:  FOTO 37 with target score of 43  TODAY'S TREATMENT:                                                                                                                              DATE: 01/12/23  There.ex: administered w/RW (see below for details.) Standing mini squats at RW 2 x10 Seated ankle heel to toe raises: 2x10/side Leg press 20# x8 (difficulty w/ leg press 2/2 weakness and external rotation of the RLE, exercise discontinued)   Neuro re-ed:  STS to ambulation CGA with PRN HHA 3 x10' down and back. VC's for progressing step through gait pattern.  Notable decrease in step lengths with turning around cone .   Tandem stance on level ground RLE/LLE 2 x30sec/ each side; intermittent single UE support; CGA  Romberg stance on airex pad 1 x30sec; CGA ; intermittent single UE support   Standing alt marches at RW: 3 x30sec, supervision at [facility] w/ 2# AW's on bilat LE's.    PATIENT EDUCATION: Education details: HEP, safe use of AD, POC Person educated: Patient Education method: Explanation, Demonstration, Verbal cues, and Handouts Education comprehension: verbalized understanding and needs further education  HOME EXERCISE PROGRAM: Access Code: [email or web address] URL: [email or web address] Date: 12/13/2022 Prepared by: Ronnie Derby  Exercises - Supine Bridge  - 1 x daily - 7 x weekly - 3 sets - 6 reps - Seated Scapular Retraction  - 1 x daily - 7 x weekly - 3 sets - 15 reps - Seated Ankle Circles  - 1 x daily - 7 x weekly - 3 sets - 15 reps  GOALS: Goals reviewed with patient? No  SHORT TERM GOALS: Target date: 12/24/22  Pt will be independent with HEP to improve RLE strength for transfers and gait Baseline: 12/13/22: Provided initial HEP Goal status: INITIAL  2.  Pt will demonstrate safe use of hand placement with transfers with LRAD to demonstrate reduced falls risk. Baseline: 12/13/22: BUE use on AD with STS and stand to sit transfers Goal status: INITIAL  LONG TERM GOALS: Target date: 03/07/23  Pt will improve FOTO to target score to demonstrate clinically significant improvement in functional mobility.  Baseline:  12/13/22: 37/43 Goal status: INITIAL  2.  Pt will complete TUG in 12 seconds or less with LRAD to demonstrate reduced falls risk with transfers and short distance gait Baseline: 12/13/22: 26.89 seconds Goal status: INITIAL  3.  Pt will improve 5xSTS to 11.4 seconds or less without UE support to demonstrate improved LE strength to match age based norms.  Baseline: : 12/13/22: 29.37 seconds Goal status: INITIAL  4.   Pt will improve FGA by at least 4 points to demonstrate clinically significant improvement in balance with community activities.  Baseline: 12/13/22: deferred to next session; 12/29/22: 14/30 Goal status: INITIAL  5.  Pt will improve gait speed to at least 1.0 m/s with LRAD to demonstrate safe ability to complete short community distances. Baseline: 12/13/22: 0.48 m/s with RW Goal status: INITIAL  6. Pt will increase 6 MWT by > 165' to display improvements in functional endurance with community ambulation.   Baseline: 01/12/23: 429 ft w/ 7/10 NPS   ASSESSMENT:  CLINICAL IMPRESSION: Session focused on progressing pt's bilat LE strengthening exercises, administering the , along w/ balance exercises. Pt notes improvements in LE strength noted w/ improved activity tolerance during exercises at today's session. Pt noted decreased cadence, need for multiple standing rest breaks, and trendelenburg gait during w/ RW. Pt continues to note difficulty w/ ambulation w/o RW noting increased fear of falling requiring HHA from therapist and v/c's to continue advancement.  Pt addresses LBP and weakness of the RLE> LLE noted w/ pt reported functional tasks and requires VC's for safety awareness with use of RW to reduce falls risk still relying on RW with walking tasks due to RLE weakness. Pt would continue to benefit from skilled PT interventions to address remaining RLE strength, pain and balance deficits to maximize return to independence and reduce falls risk.  OBJECTIVE IMPAIRMENTS: Abnormal gait, decreased balance, decreased mobility, difficulty walking, decreased ROM, decreased strength, impaired sensation, and postural dysfunction.   ACTIVITY LIMITATIONS: carrying, lifting, bending, standing, stairs, transfers, reach over head, and locomotion level  PARTICIPATION LIMITATIONS: cleaning, laundry, driving, shopping, and community activity  PERSONAL FACTORS: Age, Past/current experiences, and Time since  onset of injury/illness/exacerbation are also affecting patient's functional outcome.   REHAB POTENTIAL: Good  CLINICAL DECISION MAKING: Evolving/moderate complexity  EVALUATION COMPLEXITY: Moderate  PLAN:  PT FREQUENCY: 2x/week  PT DURATION: 12 weeks  PLANNED INTERVENTIONS: Therapeutic exercises, Therapeutic activity, Neuromuscular re-education, Balance training, Gait training, Patient/Family education, Self Care, Stair training, DME instructions, Manual therapy, and Re-evaluation  PLAN FOR NEXT SESSION:  RLE strength tasks. Progress gait and balance with LRAD.   Lovie Macadamia, SPT  Delphia Grates. Fairly IV, PT, DPT Physical Therapist- [facility]  01/12/2023, 2:07 PM

## 2023-01-13 ENCOUNTER — Other Ambulatory Visit: Payer: Self-pay | Admitting: Otolaryngology

## 2023-01-13 DIAGNOSIS — E041 Nontoxic single thyroid nodule: Secondary | ICD-10-CM

## 2023-01-16 ENCOUNTER — Ambulatory Visit: Payer: 59

## 2023-01-16 DIAGNOSIS — R262 Difficulty in walking, not elsewhere classified: Secondary | ICD-10-CM

## 2023-01-16 DIAGNOSIS — M6281 Muscle weakness (generalized): Secondary | ICD-10-CM

## 2023-01-16 NOTE — Therapy (Addendum)
OUTPATIENT PHYSICAL THERAPY NEURO TREATMENT   Patient Name: Susan [name] MRN: [id number] DOB:09/20/1960, 62 y.o., female Today's Date: 01/16/2023   PCP: [name] MD REFERRING PROVIDER: Venetia Night MD  END OF SESSION:  PT End of Session - 01/16/23 1121     Visit Number 5    Number of Visits 25    Date for PT Re-Evaluation 03/07/23    PT Start Time 1116    PT Stop Time 1159    PT Time Calculation (min) 43 min    Equipment Utilized During Treatment Gait belt    Activity Tolerance Patient tolerated treatment well    Behavior During Therapy WFL for tasks assessed/performed             Past Medical History:  Diagnosis Date   ADD (attention deficit disorder)    Alcohol abuse    Anemia    Anxiety    Aortic atherosclerosis (HCC) 02/20/2018   Chest CT Sept 2019   Arthritis    rheumatoid arthritis   Asthma    Bipolar disorder (HCC)    Centrilobular emphysema (HCC)    Cocaine use disorder, moderate, in sustained remission (HCC)    Constipation    Degenerative disc disease at L5-S1 level 09/28/2016   See ortho note May 2018   Depression    bipolar, hx of suicide attempt   Diabetes mellitus, type 2 (HCC)    Diverticulitis 2013   DOE (dyspnea on exertion)    Drug use    Family history of adverse reaction to anesthesia    mom-delayed emergence   GERD (gastroesophageal reflux disease)    H/O suicide attempt    slit wrists   Hepatitis C 06/26/2014   treated with Harvoni   Hip pain    History of echocardiogram    a. 04/2019 Echo: EF >65%, nl RV fxn.   History of MRSA infection 2013   HLD (hyperlipidemia)    Hypertension    a. 06/2021 Renal Duplex: ? bilat RAS; b. 06/2021 CTA Abd: No signif RAS.   Hyponatremia    Hypothyroidism    Incisional hernia 11/08/2012   Knee pain    Left carotid bruit    Morbid obesity (HCC)    Multinodular thyroid    OSA (obstructive sleep apnea)    OSA on CPAP    Osteoporosis    Palpitations    Post-traumatic  osteoarthritis of right knee 09/24/2015   Recurrent ventral hernia 11/08/2012   Status post total right knee replacement using cement 05/10/2016   Stroke (HCC) 07/05/2022   right arm,torso and right leg numbness   Tobacco use disorder    Vitamin B12 deficiency    Vitamin D deficiency disease    Past Surgical History:  Procedure Laterality Date   BILATERAL SALPINGOOPHORECTOMY     due to abnormal mass   BREAST BIOPSY Left    neg   BREAST SURGERY Left 20 yrs ago   CESAREAN SECTION     COLONOSCOPY     COLONOSCOPY WITH PROPOFOL N/A 10/16/2017   Procedure: COLONOSCOPY WITH PROPOFOL;  Surgeon: Wyline Mood, MD;  Location: [facility] ENDOSCOPY;  Service: Gastroenterology;  Laterality: N/A;   COLONOSCOPY WITH PROPOFOL N/A 02/16/2021   Procedure: COLONOSCOPY WITH PROPOFOL;  Surgeon: Wyline Mood, MD;  Location: [facility] ENDOSCOPY;  Service: Gastroenterology;  Laterality: N/A;   FORAMINOTOMY 1 LEVEL Right 10/10/2022   Procedure: RIGHT L4-5 LAMINOFORAMINOTOMY;  Surgeon: Venetia Night, MD;  Location: [facility] ORS;  Service: Neurosurgery;  Laterality: Right;  HERNIA REPAIR  06/2011, July 2014   Ventral wall repair with Physiomesh   HERNIA REPAIR     2nd.vental wall repair   JOINT REPLACEMENT Right    knee   LUMBAR LAMINECTOMY/ DECOMPRESSION WITH MET-RX N/A 10/12/2022   Procedure: LUMBAR LAMINECTOMY/ DECOMPRESSION WITH MET-RX;  Surgeon: Venetia Night, MD;  Location: [facility] ORS;  Service: Neurosurgery;  Laterality: N/A;   TONSILLECTOMY     TOTAL HIP ARTHROPLASTY Left 07/23/2019   Procedure: TOTAL HIP ARTHROPLASTY;  Surgeon: Christena Flake, MD;  Location: [facility] ORS;  Service: Orthopedics;  Laterality: Left;   TOTAL KNEE ARTHROPLASTY Right 05/10/2016   Procedure: TOTAL KNEE ARTHROPLASTY;  Surgeon: Christena Flake, MD;  Location: [facility] ORS;  Service: Orthopedics;  Laterality: Right;   TUBAL LIGATION     Patient Active Problem List   Diagnosis Date Noted   Blister of foot without infection, right, initial encounter  01/05/2023   Bipolar 1 disorder, depressed, moderate (HCC) 11/29/2022   Acute respiratory failure with hypoxia (HCC) 10/13/2022   AKI (acute kidney injury) (HCC) 10/13/2022   Anemia 10/13/2022   Cauda equina syndrome (HCC) 10/12/2022   Epidural hematoma (HCC) 10/12/2022   Mass in epidural space 10/12/2022   Synovial cyst of lumbar facet joint 10/10/2022   Lumbar radiculopathy 10/10/2022   S/P laminectomy 10/10/2022   Preop examination 09/28/2022   Epigastric pain 07/22/2022   Enlarged thyroid 07/22/2022   Chronic diarrhea 07/22/2022   CVA (cerebrovascular accident) (HCC) 07/05/2022   Skin lesion 06/28/2022   BMI 38.0-38.9,adult 07/14/2021   Type 2 diabetes mellitus without complication, without long-term current use of insulin (HCC) 06/16/2021   H/O thyroid nodule 05/31/2021   History of hepatitis C- treated 2014- 2015 with [facility] and followed.  05/19/2021   Left carotid bruit 04/28/2021   Anxiety 05/28/2020   Bipolar affective disorder in remission (HCC) 05/28/2020   Bipolar disorder, in full remission, most recent episode mixed (HCC) 03/06/2020   Need for immunization against influenza 01/27/2020   Routine physical examination 01/27/2020   Morbid obesity (HCC) 01/27/2020   Osteoporosis 11/06/2019   Bipolar 1 disorder, mixed, mild (HCC) 10/29/2019   PTSD (post-traumatic stress disorder) 09/27/2019   Bereavement 09/27/2019   Cocaine use disorder, moderate, in sustained remission (HCC) 09/27/2019   Status post total hip replacement, left 07/23/2019   Primary osteoarthritis of left hip 06/14/2019   Depression 10/23/2018   Allergic rhinitis 08/23/2018   Aortic atherosclerosis (HCC) 02/20/2018   Centrilobular emphysema (HCC) 02/20/2018   Hyperlipidemia 03/28/2017   Degenerative disc disease at L5-S1 level 09/28/2016   Tobacco use disorder 04/21/2016   GERD (gastroesophageal reflux disease) 12/17/2015   Chronic back pain 10/09/2015   Post-traumatic osteoarthritis  of right knee 09/24/2015   Alcohol use disorder, severe, in sustained remission (HCC) 04/13/2015   Vitamin D deficiency 04/09/2015   Vitamin B12 deficiency 04/09/2015   OSA on CPAP 04/09/2015   Subclinical hyperthyroidism 06/26/2014   Goiter colloid, toxic, nodular 06/26/2014   Hypertension 06/26/2014   Tobacco use disorder, continuous 06/26/2014   Bipolar I disorder (HCC) 06/26/2014   Lumbar radiculitis 10/03/2013   Diverticulosis 08/24/2013    ONSET DATE: 10/12/22  REFERRING DIAG: Z61.096 (ICD-10-CM) - Status post lumbar spine surgery for decompression of spinal cord G83.4 (ICD-10-CM) - Cauda equina syndrome (HCC)  THERAPY DIAG:  Muscle weakness (generalized)  Difficulty in walking, not elsewhere classified  Rationale for Evaluation and Treatment: Rehabilitation  SUBJECTIVE:  SUBJECTIVE STATEMENT: Pt reports 6/10 LBP. She states that she was able to sweep/ mop her floors, and make her bed over the weekend without significant increase in pain.   Pt accompanied by: self  PERTINENT HISTORY:  Achaia Bornt is a 62 y/o F with PMH: HTN, DM2, HLD, ETOH use, THA, PTSD, cocaine, bipolar, and L thalamic infarct (06/2022), s/p lumbar laminoforaminotomy (10/10/22). Presenting post L3-5 decompression with evacuation of epidural hematoma on 10/12/22 for Cauda Equina Syndrome. Pt has residual RLE deficits after her lumbar surgeries leading to ambulating with RW. She was independent prior to her surgeries now reporting balance and gait difficulties. Has had some Worsening RUE N/T since her CVA in February after her lumbar surgeries. Reports N/T 6-7/10 on severity. Mentions some falls when not using her RW. Feels like she always has to have a hand on something to steady herself.  Pt has residual RLE deficits after her  lumbar surgeries leading to ambulating with RW. She was independent prior to her surgeries now reporting balance and gait difficulties. Has had some Worsening RUE N/T since her CVA in February after her lumbar surgeries. Reports N/T 6-7/10 on severity. Mentions some falls when not using her RW. Feels like she always has to have a hand on something to steady herself. Has had some falls anterior standing too close in the RW, a fall walking up the stairs with the RW. She is able to get herself up. Pt describing some unsafe scenarios with poor RW use with transfers and asc/desc curbs.   PAIN:  Are you having pain? No  PRECAUTIONS: Back  RED FLAGS: Bowel or bladder incontinence: Yes: MD aware since surgery.     WEIGHT BEARING RESTRICTIONS: No  FALLS: Has patient fallen in last 6 months? Yes. Number of falls 3  LIVING ENVIRONMENT: Lives with: lives with their son   PLOF: Independent  PATIENT GOALS: Improve her walking and   OBJECTIVE:   DIAGNOSTIC FINDINGS: CLINICAL DATA:  Elective surgery   EXAM: LUMBAR SPINE - 2-3 VIEW   COMPARISON:  10/10/2022   FINDINGS: Intraoperative fluoroscopy is utilized for surgical control purposes. Fluoroscopy time is recorded at 6 seconds. Dose 5.33 mGy. Two spot fluoroscopic images are obtained. Initial image demonstrates a localization marker posterior to the L4 vertebra. Second image demonstrates a localization marker posterior to the L3-4 interspace level.   IMPRESSION: Intraoperative fluoroscopy utilized for surgical control purposes for lumbar spine localization.     Electronically Signed   By: Burman Nieves M.D.   On: 10/12/2022 20:24  COGNITION: Overall cognitive status: Within functional limits for tasks assessed   SENSATION: Light touch: Impaired  RLE: impaired L2, L4-L5  COORDINATION: Heel to shin normal bilaterally   PROPRIOCEPTION: Normal bilat   POSTURE:  Heavy BUE support on RW with standing tasks with bilateral  upper trap use   LUMBAR AROM: Flexion: 75% limited  Extension: 50% limited  Rotation R/L: 50% limited  Lateral flexion R/L: 50% limited  LOWER EXTREMITY AROM:  L ankle inversion: 20, eversion: 35  R ankle inversion: 15 , eversion: 25    LOWER EXTREMITY MMT:    MMT Right Eval Left Eval  Hip flexion 4- 4  Hip extension    Hip abduction    Hip adduction    Hip internal rotation    Hip external rotation    Knee flexion 4- 4+  Knee extension 4 4+  Ankle dorsiflexion 4- 4+  Ankle plantarflexion    Ankle inversion 3 4+  Ankle  eversion 3 4-  (Blank rows = not tested)  TRANSFERS: Assistive device utilized: Environmental consultant - 2 wheeled  Sit to stand: Modified independence Stand to sit: Modified independence  *poor hand placement keeping BUE's on RW for STS and stand to sit despite VC's and education for safe hand placement.   GAIT: Gait pattern: step through pattern, decreased stance time- Right, decreased ankle dorsiflexion- Right, and poor foot clearance- Right. Notable R ankle inversion in stance phase due to evertor weakness Distance walked: 10 meters Assistive device utilized: Walker - 2 wheeled Level of assistance: Modified independence Comments: Heavy BUE support on RW. PT adjusting RW height to reduced B shoulder shrug.   FUNCTIONAL TESTS:  5 times sit to stand: 29.37 seconds Timed up and go (TUG): 26.89 seconds 10 meter walk test: 0.48 m/s with RW  PATIENT SURVEYS:  FOTO 37 with target score of 43  TODAY'S TREATMENT: DATE: 01/16/23  There.ex: Nustep x5 min lvl 3.0 to increase lumbar mobility and LE strengthening  STS from mat table w/ bilat UE push off from thighs 2 x10 Seated heel raises w/ 6# DB on bilat distal thighs RLE/LLE 2 x10/ each side Alternating forward taps onto 6" step RLE/LLE 2 x10/ each direction bilat UE support Lateral step ups onto aerobic step RLE/ LLE 2 x10/ each side bilat UE support; CGA needed   Neuro re-ed:  STS to ambulation CGA with  PRN HHA 3 x10' down and back. VC's for progressing step through gait pattern. Notable decrease in step lengths with turning around cone .   Tandem stance on airex pad RLE/LLE 2 x30sec/ each side; intermittent single UE support; CGA  Romberg stance on airex pad 2 x30sec; CGA ; intermittent single UE support   PATIENT EDUCATION: Education details: HEP, safe use of AD, POC Person educated: Patient Education method: Explanation, Demonstration, Verbal cues, and Handouts Education comprehension: verbalized understanding and needs further education  HOME EXERCISE PROGRAM: Access Code: [email or web address] URL: [email or web address] Date: 12/13/2022 Prepared by: Ronnie Derby  Exercises - Supine Bridge  - 1 x daily - 7 x weekly - 3 sets - 6 reps - Seated Scapular Retraction  - 1 x daily - 7 x weekly - 3 sets - 15 reps - Seated Ankle Circles  - 1 x daily - 7 x weekly - 3 sets - 15 reps  GOALS: Goals reviewed with patient? No  SHORT TERM GOALS: Target date: 12/24/22  Pt will be independent with HEP to improve RLE strength for transfers and gait Baseline: 12/13/22: Provided initial HEP Goal status: INITIAL  2.  Pt will demonstrate safe use of hand placement with transfers with LRAD to demonstrate reduced falls risk. Baseline: 12/13/22: BUE use on AD with STS and stand to sit transfers Goal status: INITIAL  LONG TERM GOALS: Target date: 03/07/23  Pt will improve FOTO to target score to demonstrate clinically significant improvement in functional mobility.  Baseline: 12/13/22: 37/43 Goal status: INITIAL  2.  Pt will complete TUG in 12 seconds or less with LRAD to demonstrate reduced falls risk with transfers and short distance gait Baseline: 12/13/22: 26.89 seconds Goal status: INITIAL  3.  Pt will improve 5xSTS to 11.4 seconds or less without UE support to demonstrate improved LE strength to match age based norms.  Baseline: : 12/13/22: 29.37 seconds Goal status: INITIAL  4.  Pt will  improve FGA by at least 4 points to demonstrate clinically significant improvement in balance with community activities.  Baseline: 12/13/22: deferred to next session;  12/29/22: 14/30 Goal status: INITIAL  5.  Pt will improve gait speed to at least 1.0 m/s with LRAD to demonstrate safe ability to complete short community distances. Baseline: 12/13/22: 0.48 m/s with RW Goal status: INITIAL  6. Pt will increase 6 MWT by > 165' to display improvements in functional endurance with community ambulation.   Baseline: 01/12/23: 429 ft w/ 7/10 NPS   ASSESSMENT:  CLINICAL IMPRESSION:  Session focused on progressing pt's bilat LE strengthening  and balance exercises. Pt notes improvements in LE strength noted w/ improved activity tolerance during exercises at today's session. Pt notes improvements in ambulation w/o RW noting improved turning abilities and decreased posterior lean however, pt continues to display fear of falling requiring HHA from therapist and v/c's to continue advancement.  Pt self reports improved ability to perform functional tasks within the home such as making her bed and cleaning ADL's. Pt would continue to benefit from skilled PT interventions to address remaining RLE strength, pain and balance deficits to maximize return to independence and reduce falls risk.  OBJECTIVE IMPAIRMENTS: Abnormal gait, decreased balance, decreased mobility, difficulty walking, decreased ROM, decreased strength, impaired sensation, and postural dysfunction.   ACTIVITY LIMITATIONS: carrying, lifting, bending, standing, stairs, transfers, reach over head, and locomotion level  PARTICIPATION LIMITATIONS: cleaning, laundry, driving, shopping, and community activity  PERSONAL FACTORS: Age, Past/current experiences, and Time since onset of injury/illness/exacerbation are also affecting patient's functional outcome.   REHAB POTENTIAL: Good  CLINICAL DECISION MAKING: Evolving/moderate complexity  EVALUATION  COMPLEXITY: Moderate  PLAN:  PT FREQUENCY: 2x/week  PT DURATION: 12 weeks  PLANNED INTERVENTIONS: Therapeutic exercises, Therapeutic activity, Neuromuscular re-education, Balance training, Gait training, Patient/Family education, Self Care, Stair training, DME instructions, Manual therapy, and Re-evaluation  PLAN FOR NEXT SESSION: Progress gait and balance with LRAD. Progress balance activities.    Lovie Macadamia, SPT  Nolon Bussing, PT, DPT Physical Therapist - [facility]  01/16/23, 12:35 PM

## 2023-01-18 ENCOUNTER — Other Ambulatory Visit: Payer: Self-pay | Admitting: Neurosurgery

## 2023-01-19 ENCOUNTER — Ambulatory Visit: Payer: 59

## 2023-01-19 ENCOUNTER — Telehealth: Payer: Self-pay | Admitting: Family Medicine

## 2023-01-19 DIAGNOSIS — R262 Difficulty in walking, not elsewhere classified: Secondary | ICD-10-CM

## 2023-01-19 DIAGNOSIS — M6281 Muscle weakness (generalized): Secondary | ICD-10-CM

## 2023-01-19 NOTE — Therapy (Addendum)
OUTPATIENT PHYSICAL THERAPY NEURO TREATMENT   Patient Name: Susan [name] MRN: [id number] DOB:[birth year], 62 y.o., female Today's Date: 01/19/2023   PCP: [name] MD REFERRING PROVIDER: Venetia Night MD  END OF SESSION:  PT End of Session - 01/19/23 1028     Visit Number 6    Number of Visits 25    Date for PT Re-Evaluation 03/07/23    PT Start Time 1030    PT Stop Time 1112    PT Time Calculation (min) 42 min    Equipment Utilized During Treatment Gait belt    Activity Tolerance Patient tolerated treatment well    Behavior During Therapy WFL for tasks assessed/performed             Past Medical History:  Diagnosis Date   ADD (attention deficit disorder)    Alcohol abuse    Anemia    Anxiety    Aortic atherosclerosis (HCC) 02/20/2018   Chest CT Sept 2019   Arthritis    rheumatoid arthritis   Asthma    Bipolar disorder (HCC)    Centrilobular emphysema (HCC)    Cocaine use disorder, moderate, in sustained remission (HCC)    Constipation    Degenerative disc disease at L5-S1 level 09/28/2016   See ortho note May 2018   Depression    bipolar, hx of suicide attempt   Diabetes mellitus, type 2 (HCC)    Diverticulitis 2013   DOE (dyspnea on exertion)    Drug use    Family history of adverse reaction to anesthesia    mom-delayed emergence   GERD (gastroesophageal reflux disease)    H/O suicide attempt    slit wrists   Hepatitis C 06/26/2014   treated with Harvoni   Hip pain    History of echocardiogram    a. 04/2019 Echo: EF >65%, nl RV fxn.   History of MRSA infection 2013   HLD (hyperlipidemia)    Hypertension    a. 06/2021 Renal Duplex: ? bilat RAS; b. 06/2021 CTA Abd: No signif RAS.   Hyponatremia    Hypothyroidism    Incisional hernia 11/08/2012   Knee pain    Left carotid bruit    Morbid obesity (HCC)    Multinodular thyroid    OSA (obstructive sleep apnea)    OSA on CPAP    Osteoporosis    Palpitations    Post-traumatic  osteoarthritis of right knee 09/24/2015   Recurrent ventral hernia 11/08/2012   Status post total right knee replacement using cement 05/10/2016   Stroke (HCC) 07/05/2022   right arm,torso and right leg numbness   Tobacco use disorder    Vitamin B12 deficiency    Vitamin D deficiency disease    Past Surgical History:  Procedure Laterality Date   BILATERAL SALPINGOOPHORECTOMY     due to abnormal mass   BREAST BIOPSY Left    neg   BREAST SURGERY Left 20 yrs ago   CESAREAN SECTION     COLONOSCOPY     COLONOSCOPY WITH PROPOFOL N/A 10/16/2017   Procedure: COLONOSCOPY WITH PROPOFOL;  Surgeon: Wyline Mood, MD;  Location[facility] ENDOSCOPY;  Service: Gastroenterology;  Laterality: N/A;   COLONOSCOPY WITH PROPOFOL N/A 02/16/2021   Procedure: COLONOSCOPY WITH PROPOFOL;  Surgeon: Wyline Mood, MD;  Location: [facility] ENDOSCOPY;  Service: Gastroenterology;  Laterality: N/A;   FORAMINOTOMY 1 LEVEL Right 10/10/2022   Procedure: RIGHT L4-5 LAMINOFORAMINOTOMY;  Surgeon: Venetia Night, MD;  Location: [facility] ORS;  Service: Neurosurgery;  Laterality: Right;  HERNIA REPAIR  06/2011, July 2014   Ventral wall repair with Physiomesh   HERNIA REPAIR     2nd.vental wall repair   JOINT REPLACEMENT Right    knee   LUMBAR LAMINECTOMY/ DECOMPRESSION WITH MET-RX N/A 10/12/2022   Procedure: LUMBAR LAMINECTOMY/ DECOMPRESSION WITH MET-RX;  Surgeon: Venetia Night, MD;  Location: [facility] ORS;  Service: Neurosurgery;  Laterality: N/A;   TONSILLECTOMY     TOTAL HIP ARTHROPLASTY Left 07/23/2019   Procedure: TOTAL HIP ARTHROPLASTY;  Surgeon: Christena Flake, MD;  Location: [facility] ORS;  Service: Orthopedics;  Laterality: Left;   TOTAL KNEE ARTHROPLASTY Right 05/10/2016   Procedure: TOTAL KNEE ARTHROPLASTY;  Surgeon: Christena Flake, MD;  Location: [facility] ORS;  Service: Orthopedics;  Laterality: Right;   TUBAL LIGATION     Patient Active Problem List   Diagnosis Date Noted   Blister of foot without infection, right, initial encounter  01/05/2023   Bipolar 1 disorder, depressed, moderate (HCC) 11/29/2022   Acute respiratory failure with hypoxia (HCC) 10/13/2022   AKI (acute kidney injury) (HCC) 10/13/2022   Anemia 10/13/2022   Cauda equina syndrome (HCC) 10/12/2022   Epidural hematoma (HCC) 10/12/2022   Mass in epidural space 10/12/2022   Synovial cyst of lumbar facet joint 10/10/2022   Lumbar radiculopathy 10/10/2022   S/P laminectomy 10/10/2022   Preop examination 09/28/2022   Epigastric pain 07/22/2022   Enlarged thyroid 07/22/2022   Chronic diarrhea 07/22/2022   CVA (cerebrovascular accident) (HCC) 07/05/2022   Skin lesion 06/28/2022   BMI 38.0-38.9,adult 07/14/2021   Type 2 diabetes mellitus without complication, without long-term current use of insulin (HCC) 06/16/2021   H/O thyroid nodule 05/31/2021   History of hepatitis C- treated 2014- 2015 with [facility] gastroenterology and followed.  05/19/2021   Left carotid bruit 04/28/2021   Anxiety 05/28/2020   Bipolar affective disorder in remission (HCC) 05/28/2020   Bipolar disorder, in full remission, most recent episode mixed (HCC) 03/06/2020   Need for immunization against influenza 01/27/2020   Routine physical examination 01/27/2020   Morbid obesity (HCC) 01/27/2020   Osteoporosis 11/06/2019   Bipolar 1 disorder, mixed, mild (HCC) 10/29/2019   PTSD (post-traumatic stress disorder) 09/27/2019   Bereavement 09/27/2019   Cocaine use disorder, moderate, in sustained remission (HCC) 09/27/2019   Status post total hip replacement, left 07/23/2019   Primary osteoarthritis of left hip 06/14/2019   Depression 10/23/2018   Allergic rhinitis 08/23/2018   Aortic atherosclerosis (HCC) 02/20/2018   Centrilobular emphysema (HCC) 02/20/2018   Hyperlipidemia 03/28/2017   Degenerative disc disease at L5-S1 level 09/28/2016   Tobacco use disorder 04/21/2016   GERD (gastroesophageal reflux disease) 12/17/2015   Chronic back pain 10/09/2015   Post-traumatic osteoarthritis  of right knee 09/24/2015   Alcohol use disorder, severe, in sustained remission (HCC) 04/13/2015   Vitamin D deficiency 04/09/2015   Vitamin B12 deficiency 04/09/2015   OSA on CPAP 04/09/2015   Subclinical hyperthyroidism 06/26/2014   Goiter colloid, toxic, nodular 06/26/2014   Hypertension 06/26/2014   Tobacco use disorder, continuous 06/26/2014   Bipolar I disorder (HCC) 06/26/2014   Lumbar radiculitis 10/03/2013   Diverticulosis 08/24/2013    ONSET DATE: 10/12/22  REFERRING DIAG: U98.119 (ICD-10-CM) - Status post lumbar spine surgery for decompression of spinal cord G83.4 (ICD-10-CM) - Cauda equina syndrome (HCC)  THERAPY DIAG:  Muscle weakness (generalized)  Difficulty in walking, not elsewhere classified  Rationale for Evaluation and Treatment: Rehabilitation  SUBJECTIVE:  SUBJECTIVE STATEMENT: Pt reports 5/10 LBP. She states that her R foot has increased numbness today, possibly from sitting a lot yesterday. She also states she had some soreness after last visit.   Pt accompanied by: self  PERTINENT HISTORY:  Avnoor Wunderle is a 62 y/o F with PMH: HTN, DM2, HLD, ETOH use, THA, PTSD, cocaine, bipolar, and L thalamic infarct (06/2022), s/p lumbar laminoforaminotomy (10/10/22). Presenting post L3-5 decompression with evacuation of epidural hematoma on 10/12/22 for Cauda Equina Syndrome. Pt has residual RLE deficits after her lumbar surgeries leading to ambulating with RW. She was independent prior to her surgeries now reporting balance and gait difficulties. Has had some Worsening RUE N/T since her CVA in February after her lumbar surgeries. Reports N/T 6-7/10 on severity. Mentions some falls when not using her RW. Feels like she always has to have a hand on something to steady herself.  Pt has residual  RLE deficits after her lumbar surgeries leading to ambulating with RW. She was independent prior to her surgeries now reporting balance and gait difficulties. Has had some Worsening RUE N/T since her CVA in February after her lumbar surgeries. Reports N/T 6-7/10 on severity. Mentions some falls when not using her RW. Feels like she always has to have a hand on something to steady herself. Has had some falls anterior standing too close in the RW, a fall walking up the stairs with the RW. She is able to get herself up. Pt describing some unsafe scenarios with poor RW use with transfers and asc/desc curbs.   PAIN:  Are you having pain? No  PRECAUTIONS: Back  RED FLAGS: Bowel or bladder incontinence: Yes: MD aware since surgery.     WEIGHT BEARING RESTRICTIONS: No  FALLS: Has patient fallen in last 6 months? Yes. Number of falls 3  LIVING ENVIRONMENT: Lives with: lives with their son   PLOF: Independent  PATIENT GOALS: Improve her walking and   OBJECTIVE:   DIAGNOSTIC FINDINGS: CLINICAL DATA:  Elective surgery   EXAM: LUMBAR SPINE - 2-3 VIEW   COMPARISON:  10/10/2022   FINDINGS: Intraoperative fluoroscopy is utilized for surgical control purposes. Fluoroscopy time is recorded at 6 seconds. Dose 5.33 mGy. Two spot fluoroscopic images are obtained. Initial image demonstrates a localization marker posterior to the L4 vertebra. Second image demonstrates a localization marker posterior to the L3-4 interspace level.   IMPRESSION: Intraoperative fluoroscopy utilized for surgical control purposes for lumbar spine localization.     Electronically Signed   By: Burman Nieves M.D.   On: 10/12/2022 20:24  COGNITION: Overall cognitive status: Within functional limits for tasks assessed   SENSATION: Light touch: Impaired  RLE: impaired L2, L4-L5  COORDINATION: Heel to shin normal bilaterally   PROPRIOCEPTION: Normal bilat   POSTURE:  Heavy BUE support on RW with standing  tasks with bilateral upper trap use   LUMBAR AROM: Flexion: 75% limited  Extension: 50% limited  Rotation R/L: 50% limited  Lateral flexion R/L: 50% limited  LOWER EXTREMITY AROM:  L ankle inversion: 20, eversion: 35  R ankle inversion: 15 , eversion: 25    LOWER EXTREMITY MMT:    MMT Right Eval Left Eval  Hip flexion 4- 4  Hip extension    Hip abduction    Hip adduction    Hip internal rotation    Hip external rotation    Knee flexion 4- 4+  Knee extension 4 4+  Ankle dorsiflexion 4- 4+  Ankle plantarflexion    Ankle inversion 3  4+  Ankle eversion 3 4-  (Blank rows = not tested)  TRANSFERS: Assistive device utilized: Environmental consultant - 2 wheeled  Sit to stand: Modified independence Stand to sit: Modified independence  *poor hand placement keeping BUE's on RW for STS and stand to sit despite VC's and education for safe hand placement.   GAIT: Gait pattern: step through pattern, decreased stance time- Right, decreased ankle dorsiflexion- Right, and poor foot clearance- Right. Notable R ankle inversion in stance phase due to evertor weakness Distance walked: 10 meters Assistive device utilized: Walker - 2 wheeled Level of assistance: Modified independence Comments: Heavy BUE support on RW. PT adjusting RW height to reduced B shoulder shrug.   FUNCTIONAL TESTS:  5 times sit to stand: 29.37 seconds Timed up and go (TUG): 26.89 seconds 10 meter walk test: 0.48 m/s with RW  PATIENT SURVEYS:  FOTO 37 with target score of 43  TODAY'S TREATMENT: DATE: 01/19/23  There.ex: Ambulating w/ RW 2 x100' SBA; v/c's needed for proper gait mechanics including increased step length, and relaxing shoulders.   Seated heel raises w/ 6# DB on bilat distal thighs RLE/LLE 2 x10/ each side  STS from chair w/ bilat intermittent UE push off from thighs 2 x 7  Neuro re-ed:  STS to ambulation CGA with PRN HHA 3 x10' down and back. VC's for progressing step through gait pattern. Notable  decrease in step lengths with turning around cone.   4 square stepping (forward, lateral, and backward) x6; CGA (SPC introduced after first trial, noting improved mobility).  STS to ambulation CGA with cane in LUE 2 x10' down and back. VC's for proper cane positioning in LUE. Notable decrease in step lengths with turning around cone .  Tandem stance on airex pad RLE/LLE 1x30sec/ each side; intermittent single UE support; CGA  PATIENT EDUCATION: Education details: HEP, safe use of AD, POC Person educated: Patient Education method: Explanation, Demonstration, Verbal cues, and Handouts Education comprehension: verbalized understanding and needs further education  HOME EXERCISE PROGRAM: Access Code: [email or web address] URL: [email or web address] Date: 12/13/2022 Prepared by: Ronnie Derby  Exercises - Supine Bridge  - 1 x daily - 7 x weekly - 3 sets - 6 reps - Seated Scapular Retraction  - 1 x daily - 7 x weekly - 3 sets - 15 reps - Seated Ankle Circles  - 1 x daily - 7 x weekly - 3 sets - 15 reps  GOALS: Goals reviewed with patient? No  SHORT TERM GOALS: Target date: 12/24/22  Pt will be independent with HEP to improve RLE strength for transfers and gait Baseline: 12/13/22: Provided initial HEP Goal status: INITIAL  2.  Pt will demonstrate safe use of hand placement with transfers with LRAD to demonstrate reduced falls risk. Baseline: 12/13/22: BUE use on AD with STS and stand to sit transfers Goal status: INITIAL  LONG TERM GOALS: Target date: 03/07/23  Pt will improve FOTO to target score to demonstrate clinically significant improvement in functional mobility.  Baseline: 12/13/22: 37/43 Goal status: INITIAL  2.  Pt will complete TUG in 12 seconds or less with LRAD to demonstrate reduced falls risk with transfers and short distance gait Baseline: 12/13/22: 26.89 seconds Goal status: INITIAL  3.  Pt will improve 5xSTS to 11.4 seconds or less without UE support to demonstrate  improved LE strength to match age based norms.  Baseline: : 12/13/22: 29.37 seconds Goal status: INITIAL  4.  Pt will improve FGA by at least 4 points to demonstrate clinically significant  improvement in balance with community activities.  Baseline: 12/13/22: deferred to next session; 12/29/22: 14/30 Goal status: INITIAL  5.  Pt will improve gait speed to at least 1.0 m/s with LRAD to demonstrate safe ability to complete short community distances. Baseline: 12/13/22: 0.48 m/s with RW Goal status: INITIAL  6. Pt will increase 6 MWT by > 165' to display improvements in functional endurance with community ambulation.   Baseline: 01/12/23: 429 ft w/ 7/10 NPS   ASSESSMENT:  CLINICAL IMPRESSION: Session focused on progressing pt's bilat LE strengthening  and balance exercises. Pt notes improvements in LE strength noted w/ improved activity tolerance during exercises at today's session. Pt displays progression in ambulation w/o RW noting decreased posterior lean and decreased fear of falling. Pt continues to note difficulty w/ turning needing increased time and several small steps to complete movement. Upon four square step completion, a SPC was added to the pt's RUE to enhance stability with completing task. Pt noted improvements with the addition of the [facility], noting larger step width/ length. Pt educated on using the [facility] in the LUE for proper [facility] management 2/2 RLE being weaker than LLE. Pt ambulation  was reassessed w/ SPC noting decreased posterior lean and increased step length. Pt informed that she will need to be medically cleared by MD before she is able to drive again. Pt would continue to benefit from skilled PT interventions to address remaining RLE strength, pain and balance deficits to maximize return to independence and reduce falls risk.  OBJECTIVE IMPAIRMENTS: Abnormal gait, decreased balance, decreased mobility, difficulty walking, decreased ROM, decreased strength, impaired sensation, and  postural dysfunction.   ACTIVITY LIMITATIONS: carrying, lifting, bending, standing, stairs, transfers, reach over head, and locomotion level  PARTICIPATION LIMITATIONS: cleaning, laundry, driving, shopping, and community activity  PERSONAL FACTORS: Age, Past/current experiences, and Time since onset of injury/illness/exacerbation are also affecting patient's functional outcome.   REHAB POTENTIAL: Good  CLINICAL DECISION MAKING: Evolving/moderate complexity  EVALUATION COMPLEXITY: Moderate  PLAN:  PT FREQUENCY: 2x/week  PT DURATION: 12 weeks  PLANNED INTERVENTIONS: Therapeutic exercises, Therapeutic activity, Neuromuscular re-education, Balance training, Gait training, Patient/Family education, Self Care, Stair training, DME instructions, Manual therapy, and Re-evaluation  PLAN FOR NEXT SESSION: Progress gait and balance with LRAD. Progress balance activities.    Lovie Macadamia, SPT  Delphia Grates. Fairly IV, PT, DPT Physical Therapist- [place]  [facility]

## 2023-01-19 NOTE — Telephone Encounter (Signed)
Alexis from [employer] called wanting to know if the pt has stopped taking the vitamin D or if has been discontinued

## 2023-01-20 ENCOUNTER — Other Ambulatory Visit: Payer: Self-pay | Admitting: Family Medicine

## 2023-01-20 NOTE — Telephone Encounter (Signed)
Spoke to [place] at [facility] and let her know the note in the Patient chart shows vitamin D was discontinued on 07/25/22.

## 2023-01-23 NOTE — Telephone Encounter (Signed)
Is patient still on the meg a dose I saw note stated to continue on last level Vit d but no dose.

## 2023-01-24 ENCOUNTER — Ambulatory Visit: Payer: 59

## 2023-01-24 DIAGNOSIS — M6281 Muscle weakness (generalized): Secondary | ICD-10-CM | POA: Diagnosis not present

## 2023-01-24 DIAGNOSIS — R262 Difficulty in walking, not elsewhere classified: Secondary | ICD-10-CM

## 2023-01-24 NOTE — Therapy (Addendum)
OUTPATIENT PHYSICAL THERAPY NEURO TREATMENT   Patient Name: Susan [name] MRN: [id number] DOB:1961/03/14, 62 y.o., female Today's Date: 01/24/2023   PCP: [name] MD REFERRING PROVIDER: Venetia Night MD  END OF SESSION:  PT End of Session - 01/24/23 1034     Visit Number 7    Number of Visits 25    Date for PT Re-Evaluation 03/07/23    PT Start Time 1032    PT Stop Time 1115    PT Time Calculation (min) 43 min    Equipment Utilized During Treatment Gait belt    Activity Tolerance Patient tolerated treatment well    Behavior During Therapy WFL for tasks assessed/performed             Past Medical History:  Diagnosis Date   ADD (attention deficit disorder)    Alcohol abuse    Anemia    Anxiety    Aortic atherosclerosis (HCC) 02/20/2018   Chest CT Sept 2019   Arthritis    rheumatoid arthritis   Asthma    Bipolar disorder (HCC)    Centrilobular emphysema (HCC)    Cocaine use disorder, moderate, in sustained remission (HCC)    Constipation    Degenerative disc disease at L5-S1 level 09/28/2016   See ortho note May 2018   Depression    bipolar, hx of suicide attempt   Diabetes mellitus, type 2 (HCC)    Diverticulitis 2013   DOE (dyspnea on exertion)    Drug use    Family history of adverse reaction to anesthesia    mom-delayed emergence   GERD (gastroesophageal reflux disease)    H/O suicide attempt    slit wrists   Hepatitis C 06/26/2014   treated with Harvoni   Hip pain    History of echocardiogram    a. 04/2019 Echo: EF >65%, nl RV fxn.   History of MRSA infection 2013   HLD (hyperlipidemia)    Hypertension    a. 06/2021 Renal Duplex: ? bilat RAS; b. 06/2021 CTA Abd: No signif RAS.   Hyponatremia    Hypothyroidism    Incisional hernia 11/08/2012   Knee pain    Left carotid bruit    Morbid obesity (HCC)    Multinodular thyroid    OSA (obstructive sleep apnea)    OSA on CPAP    Osteoporosis    Palpitations    Post-traumatic  osteoarthritis of right knee 09/24/2015   Recurrent ventral hernia 11/08/2012   Status post total right knee replacement using cement 05/10/2016   Stroke (HCC) 07/05/2022   right arm,torso and right leg numbness   Tobacco use disorder    Vitamin B12 deficiency    Vitamin D deficiency disease    Past Surgical History:  Procedure Laterality Date   BILATERAL SALPINGOOPHORECTOMY     due to abnormal mass   BREAST BIOPSY Left    neg   BREAST SURGERY Left 20 yrs ago   CESAREAN SECTION     COLONOSCOPY     COLONOSCOPY WITH PROPOFOL N/A 10/16/2017   Procedure: COLONOSCOPY WITH PROPOFOL;  Surgeon: Wyline Mood, MD;  Location: [facility] ENDOSCOPY;  Service: Gastroenterology;  Laterality: N/A;   COLONOSCOPY WITH PROPOFOL N/A 02/16/2021   Procedure: COLONOSCOPY WITH PROPOFOL;  Surgeon: Wyline Mood, MD;  Location: [facility] ENDOSCOPY;  Service: Gastroenterology;  Laterality: N/A;   FORAMINOTOMY 1 LEVEL Right 10/10/2022   Procedure: RIGHT L4-5 LAMINOFORAMINOTOMY;  Surgeon: Venetia Night, MD;  Location: [facility] ORS;  Service: Neurosurgery;  Laterality: Right;  HERNIA REPAIR  06/2011, July 2014   Ventral wall repair with Physiomesh   HERNIA REPAIR     2nd.vental wall repair   JOINT REPLACEMENT Right    knee   LUMBAR LAMINECTOMY/ DECOMPRESSION WITH MET-RX N/A 10/12/2022   Procedure: LUMBAR LAMINECTOMY/ DECOMPRESSION WITH MET-RX;  Surgeon: Venetia Night, MD;  Location: [facility] ORS;  Service: Neurosurgery;  Laterality: N/A;   TONSILLECTOMY     TOTAL HIP ARTHROPLASTY Left 07/23/2019   Procedure: TOTAL HIP ARTHROPLASTY;  Surgeon: Christena Flake, MD;  Location: [facility] ORS;  Service: Orthopedics;  Laterality: Left;   TOTAL KNEE ARTHROPLASTY Right 05/10/2016   Procedure: TOTAL KNEE ARTHROPLASTY;  Surgeon: Christena Flake, MD;  Location: [facility] ORS;  Service: Orthopedics;  Laterality: Right;   TUBAL LIGATION     Patient Active Problem List   Diagnosis Date Noted   Blister of foot without infection, right, initial encounter  01/05/2023   Bipolar 1 disorder, depressed, moderate (HCC) 11/29/2022   Acute respiratory failure with hypoxia (HCC) 10/13/2022   AKI (acute kidney injury) (HCC) 10/13/2022   Anemia 10/13/2022   Cauda equina syndrome (HCC) 10/12/2022   Epidural hematoma (HCC) 10/12/2022   Mass in epidural space 10/12/2022   Synovial cyst of lumbar facet joint 10/10/2022   Lumbar radiculopathy 10/10/2022   S/P laminectomy 10/10/2022   Preop examination 09/28/2022   Epigastric pain 07/22/2022   Enlarged thyroid 07/22/2022   Chronic diarrhea 07/22/2022   CVA (cerebrovascular accident) (HCC) 07/05/2022   Skin lesion 06/28/2022   BMI 38.0-38.9,adult 07/14/2021   Type 2 diabetes mellitus without complication, without long-term current use of insulin (HCC) 06/16/2021   H/O thyroid nodule 05/31/2021   History of hepatitis C- treated 2014- 2015 with [facility] gastroenterology and followed.  05/19/2021   Left carotid bruit 04/28/2021   Anxiety 05/28/2020   Bipolar affective disorder in remission (HCC) 05/28/2020   Bipolar disorder, in full remission, most recent episode mixed (HCC) 03/06/2020   Need for immunization against influenza 01/27/2020   Routine physical examination 01/27/2020   Morbid obesity (HCC) 01/27/2020   Osteoporosis 11/06/2019   Bipolar 1 disorder, mixed, mild (HCC) 10/29/2019   PTSD (post-traumatic stress disorder) 09/27/2019   Bereavement 09/27/2019   Cocaine use disorder, moderate, in sustained remission (HCC) 09/27/2019   Status post total hip replacement, left 07/23/2019   Primary osteoarthritis of left hip 06/14/2019   Depression 10/23/2018   Allergic rhinitis 08/23/2018   Aortic atherosclerosis (HCC) 02/20/2018   Centrilobular emphysema (HCC) 02/20/2018   Hyperlipidemia 03/28/2017   Degenerative disc disease at L5-S1 level 09/28/2016   Tobacco use disorder 04/21/2016   GERD (gastroesophageal reflux disease) 12/17/2015   Chronic back pain 10/09/2015   Post-traumatic osteoarthritis  of right knee 09/24/2015   Alcohol use disorder, severe, in sustained remission (HCC) 04/13/2015   Vitamin D deficiency 04/09/2015   Vitamin B12 deficiency 04/09/2015   OSA on CPAP 04/09/2015   Subclinical hyperthyroidism 06/26/2014   Goiter colloid, toxic, nodular 06/26/2014   Hypertension 06/26/2014   Tobacco use disorder, continuous 06/26/2014   Bipolar I disorder (HCC) 06/26/2014   Lumbar radiculitis 10/03/2013   Diverticulosis 08/24/2013    ONSET DATE: 10/12/22  REFERRING DIAG: Y40.347 (ICD-10-CM) - Status post lumbar spine surgery for decompression of spinal cord G83.4 (ICD-10-CM) - Cauda equina syndrome (HCC)  THERAPY DIAG:  Muscle weakness (generalized)  Difficulty in walking, not elsewhere classified  Rationale for Evaluation and Treatment: Rehabilitation  SUBJECTIVE:  SUBJECTIVE STATEMENT: Pt reports no LBP at today's session. She states that she has been able to walk within her bedroom with no rolling walker.   Pt accompanied by: self  PERTINENT HISTORY:  Braylee Vanosten is a 62 y/o F with PMH: HTN, DM2, HLD, ETOH use, THA, PTSD, cocaine, bipolar, and L thalamic infarct (06/2022), s/p lumbar laminoforaminotomy (10/10/22). Presenting post L3-5 decompression with evacuation of epidural hematoma on 10/12/22 for Cauda Equina Syndrome. Pt has residual RLE deficits after her lumbar surgeries leading to ambulating with RW. She was independent prior to her surgeries now reporting balance and gait difficulties. Has had some Worsening RUE N/T since her CVA in February after her lumbar surgeries. Reports N/T 6-7/10 on severity. Mentions some falls when not using her RW. Feels like she always has to have a hand on something to steady herself.  Pt has residual RLE deficits after her lumbar surgeries leading  to ambulating with RW. She was independent prior to her surgeries now reporting balance and gait difficulties. Has had some Worsening RUE N/T since her CVA in February after her lumbar surgeries. Reports N/T 6-7/10 on severity. Mentions some falls when not using her RW. Feels like she always has to have a hand on something to steady herself. Has had some falls anterior standing too close in the RW, a fall walking up the stairs with the RW. She is able to get herself up. Pt describing some unsafe scenarios with poor RW use with transfers and asc/desc curbs.   PAIN:  Are you having pain? No  PRECAUTIONS: Back  RED FLAGS: Bowel or bladder incontinence: Yes: MD aware since surgery.     WEIGHT BEARING RESTRICTIONS: No  FALLS: Has patient fallen in last 6 months? Yes. Number of falls 3  LIVING ENVIRONMENT: Lives with: lives with their son   PLOF: Independent  PATIENT GOALS: Improve her walking and   OBJECTIVE:   DIAGNOSTIC FINDINGS: CLINICAL DATA:  Elective surgery   EXAM: LUMBAR SPINE - 2-3 VIEW   COMPARISON:  10/10/2022   FINDINGS: Intraoperative fluoroscopy is utilized for surgical control purposes. Fluoroscopy time is recorded at 6 seconds. Dose 5.33 mGy. Two spot fluoroscopic images are obtained. Initial image demonstrates a localization marker posterior to the L4 vertebra. Second image demonstrates a localization marker posterior to the L3-4 interspace level.   IMPRESSION: Intraoperative fluoroscopy utilized for surgical control purposes for lumbar spine localization.     Electronically Signed   By: Burman Nieves M.D.   On: 10/12/2022 20:24  COGNITION: Overall cognitive status: Within functional limits for tasks assessed   SENSATION: Light touch: Impaired  RLE: impaired L2, L4-L5  COORDINATION: Heel to shin normal bilaterally   PROPRIOCEPTION: Normal bilat   POSTURE:  Heavy BUE support on RW with standing tasks with bilateral upper trap use   LUMBAR  AROM: Flexion: 75% limited  Extension: 50% limited  Rotation R/L: 50% limited  Lateral flexion R/L: 50% limited  LOWER EXTREMITY AROM:  L ankle inversion: 20, eversion: 35  R ankle inversion: 15 , eversion: 25    LOWER EXTREMITY MMT:    MMT Right Eval Left Eval  Hip flexion 4- 4  Hip extension    Hip abduction    Hip adduction    Hip internal rotation    Hip external rotation    Knee flexion 4- 4+  Knee extension 4 4+  Ankle dorsiflexion 4- 4+  Ankle plantarflexion    Ankle inversion 3 4+  Ankle eversion 3 4-  (  Blank rows = not tested)  TRANSFERS: Assistive device utilized: Environmental consultant - 2 wheeled  Sit to stand: Modified independence Stand to sit: Modified independence  *poor hand placement keeping BUE's on RW for STS and stand to sit despite VC's and education for safe hand placement.   GAIT: Gait pattern: step through pattern, decreased stance time- Right, decreased ankle dorsiflexion- Right, and poor foot clearance- Right. Notable R ankle inversion in stance phase due to evertor weakness Distance walked: 10 meters Assistive device utilized: Walker - 2 wheeled Level of assistance: Modified independence Comments: Heavy BUE support on RW. PT adjusting RW height to reduced B shoulder shrug.   FUNCTIONAL TESTS:  5 times sit to stand: 29.37 seconds Timed up and go (TUG): 26.89 seconds 10 meter walk test: 0.48 m/s with RW  PATIENT SURVEYS:  FOTO 37 with target score of 43  TODAY'S TREATMENT: DATE: 01/24/23  There.ex: Nustep x5 minutes to increase LE strengthening and lumbar mobility Seated heel raises w/ 7# DB on bilat distal thighs RLE/LLE 3 x10/ each side Lateral banded walks around ankles at plinth x4 down and back w/ RTB and bilat UE support   Neuro re-ed:  STS to ambulation CGA with cane in LUE 3 x10' down and back. 1 x 20' down and back. VC's for proper cane positioning in LUE. Notable decrease in step lengths with turning around cone . 4 square stepping  (forward, lateral, and backward) x1; CGA w/ SPC decreased step length noted Agility ladder w/ RW x3 down and back emphasizing increased step length w/ 1 foot RLE/LLE into every square.   PATIENT EDUCATION: Education details: HEP, safe use of AD, POC Person educated: Patient Education method: Explanation, Demonstration, Verbal cues, and Handouts Education comprehension: verbalized understanding and needs further education  HOME EXERCISE PROGRAM: Access Code: [email or web address] URL: [name] Date: 12/13/2022 Prepared by: Ronnie Derby  Exercises - Supine Bridge  - 1 x daily - 7 x weekly - 3 sets - 6 reps - Seated Scapular Retraction  - 1 x daily - 7 x weekly - 3 sets - 15 reps - Seated Ankle Circles  - 1 x daily - 7 x weekly - 3 sets - 15 reps  GOALS: Goals reviewed with patient? No  SHORT TERM GOALS: Target date: 12/24/22  Pt will be independent with HEP to improve RLE strength for transfers and gait Baseline: 12/13/22: Provided initial HEP Goal status: INITIAL  2.  Pt will demonstrate safe use of hand placement with transfers with LRAD to demonstrate reduced falls risk. Baseline: 12/13/22: BUE use on AD with STS and stand to sit transfers Goal status: INITIAL  LONG TERM GOALS: Target date: 03/07/23  Pt will improve FOTO to target score to demonstrate clinically significant improvement in functional mobility.  Baseline: 12/13/22: 37/43 Goal status: INITIAL  2.  Pt will complete TUG in 12 seconds or less with LRAD to demonstrate reduced falls risk with transfers and short distance gait Baseline: 12/13/22: 26.89 seconds w/ RW, 01/24/23: 44.20 seconds w/ 3 pronged hurry cane    Goal status: INITIAL  3.  Pt will improve 5xSTS to 11.4 seconds or less without UE support to demonstrate improved LE strength to match age based norms.  Baseline: : 12/13/22: 29.37 seconds Goal status: INITIAL  4.  Pt will improve FGA by at least 4 points to demonstrate clinically significant  improvement in balance with community activities.  Baseline: 12/13/22: deferred to next session; 12/29/22: 14/30 Goal status: INITIAL  5.  Pt will improve  gait speed to at least 1.0 m/s with LRAD to demonstrate safe ability to complete short community distances. Baseline: 12/13/22: 0.48 m/s with RW Goal status: INITIAL  6. Pt will increase 6 MWT by > 165' to display improvements in functional endurance with community ambulation.   Baseline: 01/12/23: 429 ft w/ 7/10 NPS   ASSESSMENT:  CLINICAL IMPRESSION: Session focused on progressing pt's bilat LE strengthening  and balance exercises.Pt notes improvements in LE strength noted w/ improved activity tolerance during exercises at today's session. Pt continues to have fear of falling when ambulating w/o RW and w/ 3 prong hurrycane noting a posterior lean and multiple "panic" like episodes when walking. Pt able to self correct and complete walking distance w/ v/c'ing and some HHA. Pt notes increased step length and toe clearance with agility ladder exercise but reliant on visual targers and would continue to benefit from gait training similar to this. Pt would benefit from continued PT to address remaining balance and strength deficits of the LE's and reduce falls risk.   OBJECTIVE IMPAIRMENTS: Abnormal gait, decreased balance, decreased mobility, difficulty walking, decreased ROM, decreased strength, impaired sensation, and postural dysfunction.   ACTIVITY LIMITATIONS: carrying, lifting, bending, standing, stairs, transfers, reach over head, and locomotion level  PARTICIPATION LIMITATIONS: cleaning, laundry, driving, shopping, and community activity  PERSONAL FACTORS: Age, Past/current experiences, and Time since onset of injury/illness/exacerbation are also affecting patient's functional outcome.   REHAB POTENTIAL: Good  CLINICAL DECISION MAKING: Evolving/moderate complexity  EVALUATION COMPLEXITY: Moderate  PLAN:  PT FREQUENCY: 2x/week  PT  DURATION: 12 weeks  PLANNED INTERVENTIONS: Therapeutic exercises, Therapeutic activity, Neuromuscular re-education, Balance training, Gait training, Patient/Family education, Self Care, Stair training, DME instructions, Manual therapy, and Re-evaluation  PLAN FOR NEXT SESSION: Assess pt's tolerance to new HEP exercises added. Progress gait and balance with LRAD. Progress balance activities and stepping clearance activities.    Lovie Macadamia, SPT  Delphia Grates. Fairly IV, PT, DPT Physical Therapist- [place]  [facility]

## 2023-01-24 NOTE — Telephone Encounter (Signed)
She should be on vitamin D 2000 international units daily.  Please see if she has been taking that or if she has been taking the 50,000 unit vitamin D once weekly.  Thanks.

## 2023-01-25 DIAGNOSIS — G4733 Obstructive sleep apnea (adult) (pediatric): Secondary | ICD-10-CM | POA: Diagnosis not present

## 2023-01-26 ENCOUNTER — Ambulatory Visit: Payer: 59

## 2023-01-26 DIAGNOSIS — M6281 Muscle weakness (generalized): Secondary | ICD-10-CM | POA: Diagnosis not present

## 2023-01-26 DIAGNOSIS — R262 Difficulty in walking, not elsewhere classified: Secondary | ICD-10-CM | POA: Diagnosis not present

## 2023-01-26 NOTE — Therapy (Addendum)
OUTPATIENT PHYSICAL THERAPY NEURO TREATMENT   Patient Name: Susan [name] MRN: [id number] DOB:[birth year], 62 y.o., female Today's Date: 01/26/2023   PCP: [name] MD REFERRING PROVIDER: Venetia Night MD  END OF SESSION:  PT End of Session - 01/26/23 1030     Visit Number 8    Number of Visits 25    Date for PT Re-Evaluation 03/07/23    PT Start Time 1031    PT Stop Time 1114    PT Time Calculation (min) 43 min    Equipment Utilized During Treatment Gait belt    Activity Tolerance Patient tolerated treatment well    Behavior During Therapy WFL for tasks assessed/performed             Past Medical History:  Diagnosis Date   ADD (attention deficit disorder)    Alcohol abuse    Anemia    Anxiety    Aortic atherosclerosis (HCC) 02/20/2018   Chest CT Sept 2019   Arthritis    rheumatoid arthritis   Asthma    Bipolar disorder (HCC)    Centrilobular emphysema (HCC)    Cocaine use disorder, moderate, in sustained remission (HCC)    Constipation    Degenerative disc disease at L5-S1 level 09/28/2016   See ortho note May 2018   Depression    bipolar, hx of suicide attempt   Diabetes mellitus, type 2 (HCC)    Diverticulitis 2013   DOE (dyspnea on exertion)    Drug use    Family history of adverse reaction to anesthesia    mom-delayed emergence   GERD (gastroesophageal reflux disease)    H/O suicide attempt    slit wrists   Hepatitis C 06/26/2014   treated with Harvoni   Hip pain    History of echocardiogram    a. 04/2019 Echo: EF >65%, nl RV fxn.   History of MRSA infection 2013   HLD (hyperlipidemia)    Hypertension    a. 06/2021 Renal Duplex: ? bilat RAS; b. 06/2021 CTA Abd: No signif RAS.   Hyponatremia    Hypothyroidism    Incisional hernia 11/08/2012   Knee pain    Left carotid bruit    Morbid obesity (HCC)    Multinodular thyroid    OSA (obstructive sleep apnea)    OSA on CPAP    Osteoporosis    Palpitations    Post-traumatic  osteoarthritis of right knee 09/24/2015   Recurrent ventral hernia 11/08/2012   Status post total right knee replacement using cement 05/10/2016   Stroke (HCC) 07/05/2022   right arm,torso and right leg numbness   Tobacco use disorder    Vitamin B12 deficiency    Vitamin D deficiency disease    Past Surgical History:  Procedure Laterality Date   BILATERAL SALPINGOOPHORECTOMY     due to abnormal mass   BREAST BIOPSY Left    neg   BREAST SURGERY Left 20 yrs ago   CESAREAN SECTION     COLONOSCOPY     COLONOSCOPY WITH PROPOFOL N/A 10/16/2017   Procedure: COLONOSCOPY WITH PROPOFOL;  Surgeon: Wyline Mood, MD;  Location: [facility] ENDOSCOPY;  Service: Gastroenterology;  Laterality: N/A;   COLONOSCOPY WITH PROPOFOL N/A 02/16/2021   Procedure: COLONOSCOPY WITH PROPOFOL;  Surgeon: Wyline Mood, MD;  Location: [facility] ENDOSCOPY;  Service: Gastroenterology;  Laterality: N/A;   FORAMINOTOMY 1 LEVEL Right 10/10/2022   Procedure: RIGHT L4-5 LAMINOFORAMINOTOMY;  Surgeon: Venetia Night, MD;  Location: [facility] ORS;  Service: Neurosurgery;  Laterality: Right;  HERNIA REPAIR  06/2011, July 2014   Ventral wall repair with Physiomesh   HERNIA REPAIR     2nd.vental wall repair   JOINT REPLACEMENT Right    knee   LUMBAR LAMINECTOMY/ DECOMPRESSION WITH MET-RX N/A 10/12/2022   Procedure: LUMBAR LAMINECTOMY/ DECOMPRESSION WITH MET-RX;  Surgeon: Venetia Night, MD;  Location: [facility] ORS;  Service: Neurosurgery;  Laterality: N/A;   TONSILLECTOMY     TOTAL HIP ARTHROPLASTY Left 07/23/2019   Procedure: TOTAL HIP ARTHROPLASTY;  Surgeon: Christena Flake, MD;  Location: [facility] ORS;  Service: Orthopedics;  Laterality: Left;   TOTAL KNEE ARTHROPLASTY Right 05/10/2016   Procedure: TOTAL KNEE ARTHROPLASTY;  Surgeon: Christena Flake, MD;  Location: [facility] ORS;  Service: Orthopedics;  Laterality: Right;   TUBAL LIGATION     Patient Active Problem List   Diagnosis Date Noted   Blister of foot without infection, right, initial encounter  01/05/2023   Bipolar 1 disorder, depressed, moderate (HCC) 11/29/2022   Acute respiratory failure with hypoxia (HCC) 10/13/2022   AKI (acute kidney injury) (HCC) 10/13/2022   Anemia 10/13/2022   Cauda equina syndrome (HCC) 10/12/2022   Epidural hematoma (HCC) 10/12/2022   Mass in epidural space 10/12/2022   Synovial cyst of lumbar facet joint 10/10/2022   Lumbar radiculopathy 10/10/2022   S/P laminectomy 10/10/2022   Preop examination 09/28/2022   Epigastric pain 07/22/2022   Enlarged thyroid 07/22/2022   Chronic diarrhea 07/22/2022   CVA (cerebrovascular accident) (HCC) 07/05/2022   Skin lesion 06/28/2022   BMI 38.0-38.9,adult 07/14/2021   Type 2 diabetes mellitus without complication, without long-term current use of insulin (HCC) 06/16/2021   H/O thyroid nodule 05/31/2021   History of hepatitis C- treated 2014- 2015 with [facility] gastroenterology and followed.  05/19/2021   Left carotid bruit 04/28/2021   Anxiety 05/28/2020   Bipolar affective disorder in remission (HCC) 05/28/2020   Bipolar disorder, in full remission, most recent episode mixed (HCC) 03/06/2020   Need for immunization against influenza 01/27/2020   Routine physical examination 01/27/2020   Morbid obesity (HCC) 01/27/2020   Osteoporosis 11/06/2019   Bipolar 1 disorder, mixed, mild (HCC) 10/29/2019   PTSD (post-traumatic stress disorder) 09/27/2019   Bereavement 09/27/2019   Cocaine use disorder, moderate, in sustained remission (HCC) 09/27/2019   Status post total hip replacement, left 07/23/2019   Primary osteoarthritis of left hip 06/14/2019   Depression 10/23/2018   Allergic rhinitis 08/23/2018   Aortic atherosclerosis (HCC) 02/20/2018   Centrilobular emphysema (HCC) 02/20/2018   Hyperlipidemia 03/28/2017   Degenerative disc disease at L5-S1 level 09/28/2016   Tobacco use disorder 04/21/2016   GERD (gastroesophageal reflux disease) 12/17/2015   Chronic back pain 10/09/2015   Post-traumatic osteoarthritis  of right knee 09/24/2015   Alcohol use disorder, severe, in sustained remission (HCC) 04/13/2015   Vitamin D deficiency 04/09/2015   Vitamin B12 deficiency 04/09/2015   OSA on CPAP 04/09/2015   Subclinical hyperthyroidism 06/26/2014   Goiter colloid, toxic, nodular 06/26/2014   Hypertension 06/26/2014   Tobacco use disorder, continuous 06/26/2014   Bipolar I disorder (HCC) 06/26/2014   Lumbar radiculitis 10/03/2013   Diverticulosis 08/24/2013    ONSET DATE: 10/12/22  REFERRING DIAG: Y86.578 (ICD-10-CM) - Status post lumbar spine surgery for decompression of spinal cord G83.4 (ICD-10-CM) - Cauda equina syndrome (HCC)  THERAPY DIAG:  Muscle weakness (generalized)  Difficulty in walking, not elsewhere classified  Rationale for Evaluation and Treatment: Rehabilitation  SUBJECTIVE:  SUBJECTIVE STATEMENT: Pt reports 6/10 NPS at today's session in the low back. Pt reports increased activity in the community over the last day leading to increased pain.  Pt accompanied by: self  PERTINENT HISTORY:  Orlando Ansara is a 62 y/o F with PMH: HTN, DM2, HLD, ETOH use, THA, PTSD, cocaine, bipolar, and L thalamic infarct (06/2022), s/p lumbar laminoforaminotomy (10/10/22). Presenting post L3-5 decompression with evacuation of epidural hematoma on 10/12/22 for Cauda Equina Syndrome. Pt has residual RLE deficits after her lumbar surgeries leading to ambulating with RW. She was independent prior to her surgeries now reporting balance and gait difficulties. Has had some Worsening RUE N/T since her CVA in February after her lumbar surgeries. Reports N/T 6-7/10 on severity. Mentions some falls when not using her RW. Feels like she always has to have a hand on something to steady herself.  Pt has residual RLE deficits after her  lumbar surgeries leading to ambulating with RW. She was independent prior to her surgeries now reporting balance and gait difficulties. Has had some Worsening RUE N/T since her CVA in February after her lumbar surgeries. Reports N/T 6-7/10 on severity. Mentions some falls when not using her RW. Feels like she always has to have a hand on something to steady herself. Has had some falls anterior standing too close in the RW, a fall walking up the stairs with the RW. She is able to get herself up. Pt describing some unsafe scenarios with poor RW use with transfers and asc/desc curbs.   PAIN:  Are you having pain? No  PRECAUTIONS: Back  RED FLAGS: Bowel or bladder incontinence: Yes: MD aware since surgery.     WEIGHT BEARING RESTRICTIONS: No  FALLS: Has patient fallen in last 6 months? Yes. Number of falls 3  LIVING ENVIRONMENT: Lives with: lives with their son   PLOF: Independent  PATIENT GOALS: Improve her walking and   OBJECTIVE:   DIAGNOSTIC FINDINGS: CLINICAL DATA:  Elective surgery   EXAM: LUMBAR SPINE - 2-3 VIEW   COMPARISON:  10/10/2022   FINDINGS: Intraoperative fluoroscopy is utilized for surgical control purposes. Fluoroscopy time is recorded at 6 seconds. Dose 5.33 mGy. Two spot fluoroscopic images are obtained. Initial image demonstrates a localization marker posterior to the L4 vertebra. Second image demonstrates a localization marker posterior to the L3-4 interspace level.   IMPRESSION: Intraoperative fluoroscopy utilized for surgical control purposes for lumbar spine localization.     Electronically Signed   By: Burman Nieves M.D.   On: 10/12/2022 20:24  COGNITION: Overall cognitive status: Within functional limits for tasks assessed   SENSATION: Light touch: Impaired  RLE: impaired L2, L4-L5  COORDINATION: Heel to shin normal bilaterally   PROPRIOCEPTION: Normal bilat   POSTURE:  Heavy BUE support on RW with standing tasks with bilateral  upper trap use   LUMBAR AROM: Flexion: 75% limited  Extension: 50% limited  Rotation R/L: 50% limited  Lateral flexion R/L: 50% limited  LOWER EXTREMITY AROM:  L ankle inversion: 20, eversion: 35  R ankle inversion: 15 , eversion: 25    LOWER EXTREMITY MMT:    MMT Right Eval Left Eval  Hip flexion 4- 4  Hip extension    Hip abduction    Hip adduction    Hip internal rotation    Hip external rotation    Knee flexion 4- 4+  Knee extension 4 4+  Ankle dorsiflexion 4- 4+  Ankle plantarflexion    Ankle inversion 3 4+  Ankle eversion 3  4-  (Blank rows = not tested)  TRANSFERS: Assistive device utilized: Environmental consultant - 2 wheeled  Sit to stand: Modified independence Stand to sit: Modified independence  *poor hand placement keeping BUE's on RW for STS and stand to sit despite VC's and education for safe hand placement.   GAIT: Gait pattern: step through pattern, decreased stance time- Right, decreased ankle dorsiflexion- Right, and poor foot clearance- Right. Notable R ankle inversion in stance phase due to evertor weakness Distance walked: 10 meters Assistive device utilized: Walker - 2 wheeled Level of assistance: Modified independence Comments: Heavy BUE support on RW. PT adjusting RW height to reduced B shoulder shrug.   FUNCTIONAL TESTS:  5 times sit to stand: 29.37 seconds Timed up and go (TUG): 26.89 seconds 10 meter walk test: 0.48 m/s with RW  PATIENT SURVEYS:  FOTO 37 with target score of 43  TODAY'S TREATMENT: DATE: 01/26/23  There.ex: Nustep x5 minutes to increase LE strengthening and lumbar mobility lvl 3.0 Seated heel raises w/ 7# DB on bilat distal thighs RLE/LLE  2 x10/ each side LAQ w/ 5# AW RLE 2 x10 w/ slow controlled eccentric lower   Neuro re-ed:  STS to ambulation CGA with hurry cane in LUE 1 x10' down and back. 1 x 15' down and back, 1 x 20' down and back. VC's for proper cane positioning transitioning from 3 point gait to 2 point gait in LUE  for improved balance with RLE stance time. Notable decrease in step lengths with turning around cone.  Stepping over 6" obstacle w/ RLE emphasizing hip flexion, knee flexion, and heel strike w/ 1 UE support 2 x10. Brief TC's on RLE with hip flexion to prevent hip ER compensation to clear hurdle vs true hip flexion. Good carryover after cues.  PATIENT EDUCATION: Education details: HEP, safe use of AD, POC Person educated: Patient Education method: Explanation, Demonstration, Verbal cues, and Handouts Education comprehension: verbalized understanding and needs further education  HOME EXERCISE PROGRAM: Access Code: [email or web address] URL: [email or web address] Date: 12/13/2022 Prepared by: Ronnie Derby  Exercises - Supine Bridge  - 1 x daily - 7 x weekly - 3 sets - 6 reps - Seated Scapular Retraction  - 1 x daily - 7 x weekly - 3 sets - 15 reps - Seated Ankle Circles  - 1 x daily - 7 x weekly - 3 sets - 15 reps  GOALS: Goals reviewed with patient? No  SHORT TERM GOALS: Target date: 12/24/22  Pt will be independent with HEP to improve RLE strength for transfers and gait Baseline: 12/13/22: Provided initial HEP Goal status: INITIAL  2.  Pt will demonstrate safe use of hand placement with transfers with LRAD to demonstrate reduced falls risk. Baseline: 12/13/22: BUE use on AD with STS and stand to sit transfers Goal status: INITIAL  LONG TERM GOALS: Target date: 03/07/23  Pt will improve FOTO to target score to demonstrate clinically significant improvement in functional mobility.  Baseline: 12/13/22: 37/43 Goal status: INITIAL  2.  Pt will complete TUG in 12 seconds or less with LRAD to demonstrate reduced falls risk with transfers and short distance gait Baseline: 12/13/22: 26.89 seconds w/ RW, 01/24/23: 44.20 seconds w/ 3 pronged hurry cane    Goal status: INITIAL  3.  Pt will improve 5xSTS to 11.4 seconds or less without UE support to demonstrate improved LE strength to match  age based norms.  Baseline: : 12/13/22: 29.37 seconds Goal status: INITIAL  4.  Pt will improve FGA by at  least 4 points to demonstrate clinically significant improvement in balance with community activities.  Baseline: 12/13/22: deferred to next session; 12/29/22: 14/30 Goal status: INITIAL  5.  Pt will improve gait speed to at least 1.0 m/s with LRAD to demonstrate safe ability to complete short community distances. Baseline: 12/13/22: 0.48 m/s with RW Goal status: INITIAL  6. Pt will increase 6 MWT by > 165' to display improvements in functional endurance with community ambulation.   Baseline: 01/12/23: 429 ft w/ 7/10 NPS   ASSESSMENT:  CLINICAL IMPRESSION: Session focused on progressing pt's bilat LE strengthening  and balance exercises.Pt notes improvements in LE strength noted w/ improved activity tolerance during exercises at today's session. Pt notes improvements in dual task ambulation being able to have intermittent conversation while walking along w/ ambulating increased distances. Pt educated on appropriate RW and hurry cane height along w/ proper 2 point gait ambulating w/ hurry cane. Pt's RW adjusted to correct bilateral shoulder shrug with ambulation. Pt continues to note decreased heel strike, and step length with ambulation. Pt would benefit from continued PT to address remaining balance and strength deficits of the LE's and reduce falls risk.    OBJECTIVE IMPAIRMENTS: Abnormal gait, decreased balance, decreased mobility, difficulty walking, decreased ROM, decreased strength, impaired sensation, and postural dysfunction.   ACTIVITY LIMITATIONS: carrying, lifting, bending, standing, stairs, transfers, reach over head, and locomotion level  PARTICIPATION LIMITATIONS: cleaning, laundry, driving, shopping, and community activity  PERSONAL FACTORS: Age, Past/current experiences, and Time since onset of injury/illness/exacerbation are also affecting patient's functional outcome.    REHAB POTENTIAL: Good  CLINICAL DECISION MAKING: Evolving/moderate complexity  EVALUATION COMPLEXITY: Moderate  PLAN:  PT FREQUENCY: 2x/week  PT DURATION: 12 weeks  PLANNED INTERVENTIONS: Therapeutic exercises, Therapeutic activity, Neuromuscular re-education, Balance training, Gait training, Patient/Family education, Self Care, Stair training, DME instructions, Manual therapy, and Re-evaluation  PLAN FOR NEXT SESSION: Progress gait and balance with LRAD. Progress balance activities and stepping clearance activities. Inversion and heel strike exercises for RLE.    Lovie Macadamia, SPT  Delphia Grates. Fairly IV, PT, DPT Physical Therapist- [place]  [facility]

## 2023-01-30 ENCOUNTER — Other Ambulatory Visit: Payer: Self-pay | Admitting: Psychiatry

## 2023-01-30 DIAGNOSIS — F3176 Bipolar disorder, in full remission, most recent episode depressed: Secondary | ICD-10-CM

## 2023-01-31 ENCOUNTER — Ambulatory Visit: Payer: 59

## 2023-01-31 ENCOUNTER — Other Ambulatory Visit (HOSPITAL_COMMUNITY)
Admission: RE | Admit: 2023-01-31 | Discharge: 2023-01-31 | Disposition: A | Discharge status: Home / Self Care | Payer: 59 | Source: Ambulatory Visit | Attending: Interventional Radiology | Admitting: Interventional Radiology

## 2023-01-31 ENCOUNTER — Ambulatory Visit
Admission: RE | Admit: 2023-01-31 | Discharge: 2023-01-31 | Disposition: A | Discharge status: Home / Self Care | Payer: 59 | Source: Ambulatory Visit | Attending: Otolaryngology | Admitting: Otolaryngology

## 2023-01-31 DIAGNOSIS — M6281 Muscle weakness (generalized): Secondary | ICD-10-CM | POA: Diagnosis not present

## 2023-01-31 DIAGNOSIS — R262 Difficulty in walking, not elsewhere classified: Secondary | ICD-10-CM

## 2023-01-31 DIAGNOSIS — E041 Nontoxic single thyroid nodule: Secondary | ICD-10-CM | POA: Insufficient documentation

## 2023-01-31 NOTE — Therapy (Addendum)
OUTPATIENT PHYSICAL THERAPY NEURO TREATMENT   Patient Name: Susan [name] MRN: [id number] DOB:April 22, 1961, 62 y.o., female Today's Date: 01/31/2023   PCP: [name] MD REFERRING PROVIDER: Venetia Night MD  END OF SESSION:  PT End of Session - 01/31/23 1111     Visit Number 9    Number of Visits 25    Date for PT Re-Evaluation 03/07/23    PT Start Time 1115    PT Stop Time 1159    PT Time Calculation (min) 44 min    Equipment Utilized During Treatment Gait belt    Activity Tolerance Patient tolerated treatment well    Behavior During Therapy [facility] for tasks assessed/performed             Past Medical History:  Diagnosis Date   ADD (attention deficit disorder)    Alcohol abuse    Anemia    Anxiety    Aortic atherosclerosis (HCC) 02/20/2018   Chest CT Sept 2019   Arthritis    rheumatoid arthritis   Asthma    Bipolar disorder (HCC)    Centrilobular emphysema (HCC)    Cocaine use disorder, moderate, in sustained remission (HCC)    Constipation    Degenerative disc disease at L5-S1 level 09/28/2016   See ortho note May 2018   Depression    bipolar, hx of suicide attempt   Diabetes mellitus, type 2 (HCC)    Diverticulitis 2013   DOE (dyspnea on exertion)    Drug use    Family history of adverse reaction to anesthesia    mom-delayed emergence   GERD (gastroesophageal reflux disease)    H/O suicide attempt    slit wrists   Hepatitis C 06/26/2014   treated with Harvoni   Hip pain    History of echocardiogram    a. 04/2019 Echo: EF >65%, nl RV fxn.   History of MRSA infection 2013   HLD (hyperlipidemia)    Hypertension    a. 06/2021 Renal Duplex: ? bilat RAS; b. 06/2021 CTA Abd: No signif RAS.   Hyponatremia    Hypothyroidism    Incisional hernia 11/08/2012   Knee pain    Left carotid bruit    Morbid obesity (HCC)    Multinodular thyroid    OSA (obstructive sleep apnea)    OSA on CPAP    Osteoporosis    Palpitations    Post-traumatic  osteoarthritis of right knee 09/24/2015   Recurrent ventral hernia 11/08/2012   Status post total right knee replacement using cement 05/10/2016   Stroke (HCC) 07/05/2022   right arm,torso and right leg numbness   Tobacco use disorder    Vitamin B12 deficiency    Vitamin D deficiency disease    Past Surgical History:  Procedure Laterality Date   BILATERAL SALPINGOOPHORECTOMY     due to abnormal mass   BREAST BIOPSY Left    neg   BREAST SURGERY Left 20 yrs ago   CESAREAN SECTION     COLONOSCOPY     COLONOSCOPY WITH PROPOFOL N/A 10/16/2017   Procedure: COLONOSCOPY WITH PROPOFOL;  Surgeon: Wyline Mood, MD;  Location: [facility] ENDOSCOPY;  Service: Gastroenterology;  Laterality: N/A;   COLONOSCOPY WITH PROPOFOL N/A 02/16/2021   Procedure: COLONOSCOPY WITH PROPOFOL;  Surgeon: Wyline Mood, MD;  Location: [facility] ENDOSCOPY;  Service: Gastroenterology;  Laterality: N/A;   FORAMINOTOMY 1 LEVEL Right 10/10/2022   Procedure: RIGHT L4-5 LAMINOFORAMINOTOMY;  Surgeon: Venetia Night, MD;  Location: [facility] ORS;  Service: Neurosurgery;  Laterality: Right;  HERNIA REPAIR  06/2011, July 2014   Ventral wall repair with Physiomesh   HERNIA REPAIR     2nd.vental wall repair   JOINT REPLACEMENT Right    knee   LUMBAR LAMINECTOMY/ DECOMPRESSION WITH MET-RX N/A 10/12/2022   Procedure: LUMBAR LAMINECTOMY/ DECOMPRESSION WITH MET-RX;  Surgeon: Venetia Night, MD;  Location: [facility] ORS;  Service: Neurosurgery;  Laterality: N/A;   TONSILLECTOMY     TOTAL HIP ARTHROPLASTY Left 07/23/2019   Procedure: TOTAL HIP ARTHROPLASTY;  Surgeon: Christena Flake, MD;  Location: [facility] ORS;  Service: Orthopedics;  Laterality: Left;   TOTAL KNEE ARTHROPLASTY Right 05/10/2016   Procedure: TOTAL KNEE ARTHROPLASTY;  Surgeon: Christena Flake, MD;  Location: [facility] ORS;  Service: Orthopedics;  Laterality: Right;   TUBAL LIGATION     Patient Active Problem List   Diagnosis Date Noted   Blister of foot without infection, right, initial encounter  01/05/2023   Bipolar 1 disorder, depressed, moderate (HCC) 11/29/2022   Acute respiratory failure with hypoxia (HCC) 10/13/2022   AKI (acute kidney injury) (HCC) 10/13/2022   Anemia 10/13/2022   Cauda equina syndrome (HCC) 10/12/2022   Epidural hematoma (HCC) 10/12/2022   Mass in epidural space 10/12/2022   Synovial cyst of lumbar facet joint 10/10/2022   Lumbar radiculopathy 10/10/2022   S/P laminectomy 10/10/2022   Preop examination 09/28/2022   Epigastric pain 07/22/2022   Enlarged thyroid 07/22/2022   Chronic diarrhea 07/22/2022   CVA (cerebrovascular accident) (HCC) 07/05/2022   Skin lesion 06/28/2022   BMI 38.0-38.9,adult 07/14/2021   Type 2 diabetes mellitus without complication, without long-term current use of insulin (HCC) 06/16/2021   H/O thyroid nodule 05/31/2021   History of hepatitis C- treated 2014- 2015 with [facility] gastroenterology and followed.  05/19/2021   Left carotid bruit 04/28/2021   Anxiety 05/28/2020   Bipolar affective disorder in remission (HCC) 05/28/2020   Bipolar disorder, in full remission, most recent episode mixed (HCC) 03/06/2020   Need for immunization against influenza 01/27/2020   Routine physical examination 01/27/2020   Morbid obesity (HCC) 01/27/2020   Osteoporosis 11/06/2019   Bipolar 1 disorder, mixed, mild (HCC) 10/29/2019   PTSD (post-traumatic stress disorder) 09/27/2019   Bereavement 09/27/2019   Cocaine use disorder, moderate, in sustained remission (HCC) 09/27/2019   Status post total hip replacement, left 07/23/2019   Primary osteoarthritis of left hip 06/14/2019   Depression 10/23/2018   Allergic rhinitis 08/23/2018   Aortic atherosclerosis (HCC) 02/20/2018   Centrilobular emphysema (HCC) 02/20/2018   Hyperlipidemia 03/28/2017   Degenerative disc disease at L5-S1 level 09/28/2016   Tobacco use disorder 04/21/2016   GERD (gastroesophageal reflux disease) 12/17/2015   Chronic back pain 10/09/2015   Post-traumatic osteoarthritis  of right knee 09/24/2015   Alcohol use disorder, severe, in sustained remission (HCC) 04/13/2015   Vitamin D deficiency 04/09/2015   Vitamin B12 deficiency 04/09/2015   OSA on CPAP 04/09/2015   Subclinical hyperthyroidism 06/26/2014   Goiter colloid, toxic, nodular 06/26/2014   Hypertension 06/26/2014   Tobacco use disorder, continuous 06/26/2014   Bipolar I disorder (HCC) 06/26/2014   Lumbar radiculitis 10/03/2013   Diverticulosis 08/24/2013    ONSET DATE: 10/12/22  REFERRING DIAG: E95.284 (ICD-10-CM) - Status post lumbar spine surgery for decompression of spinal cord G83.4 (ICD-10-CM) - Cauda equina syndrome (HCC)  THERAPY DIAG:  Muscle weakness (generalized)  Difficulty in walking, not elsewhere classified  Rationale for Evaluation and Treatment: Rehabilitation  SUBJECTIVE:  SUBJECTIVE STATEMENT:  Pt reports no LBP at today's session. Pt reports some mild back pain over the weekend from cleaning out her closet.   Pt accompanied by: self  PERTINENT HISTORY:  Ellender Bailer is a 62 y/o F with PMH: HTN, DM2, HLD, ETOH use, THA, PTSD, cocaine, bipolar, and L thalamic infarct (06/2022), s/p lumbar laminoforaminotomy (10/10/22). Presenting post L3-5 decompression with evacuation of epidural hematoma on 10/12/22 for Cauda Equina Syndrome. Pt has residual RLE deficits after her lumbar surgeries leading to ambulating with RW. She was independent prior to her surgeries now reporting balance and gait difficulties. Has had some Worsening RUE N/T since her CVA in February after her lumbar surgeries. Reports N/T 6-7/10 on severity. Mentions some falls when not using her RW. Feels like she always has to have a hand on something to steady herself.  Pt has residual RLE deficits after her lumbar surgeries leading to  ambulating with RW. She was independent prior to her surgeries now reporting balance and gait difficulties. Has had some Worsening RUE N/T since her CVA in February after her lumbar surgeries. Reports N/T 6-7/10 on severity. Mentions some falls when not using her RW. Feels like she always has to have a hand on something to steady herself. Has had some falls anterior standing too close in the RW, a fall walking up the stairs with the RW. She is able to get herself up. Pt describing some unsafe scenarios with poor RW use with transfers and asc/desc curbs.   PAIN:  Are you having pain? No  PRECAUTIONS: Back  RED FLAGS: Bowel or bladder incontinence: Yes: MD aware since surgery.     WEIGHT BEARING RESTRICTIONS: No  FALLS: Has patient fallen in last 6 months? Yes. Number of falls 3  LIVING ENVIRONMENT: Lives with: lives with their son   PLOF: Independent  PATIENT GOALS: Improve her walking and   OBJECTIVE:   DIAGNOSTIC FINDINGS: CLINICAL DATA:  Elective surgery   EXAM: LUMBAR SPINE - 2-3 VIEW   COMPARISON:  10/10/2022   FINDINGS: Intraoperative fluoroscopy is utilized for surgical control purposes. Fluoroscopy time is recorded at 6 seconds. Dose 5.33 mGy. Two spot fluoroscopic images are obtained. Initial image demonstrates a localization marker posterior to the L4 vertebra. Second image demonstrates a localization marker posterior to the L3-4 interspace level.   IMPRESSION: Intraoperative fluoroscopy utilized for surgical control purposes for lumbar spine localization.     Electronically Signed   By: Burman Nieves M.D.   On: 10/12/2022 20:24  COGNITION: Overall cognitive status: Within functional limits for tasks assessed   SENSATION: Light touch: Impaired  RLE: impaired L2, L4-L5  COORDINATION: Heel to shin normal bilaterally   PROPRIOCEPTION: Normal bilat   POSTURE:  Heavy BUE support on RW with standing tasks with bilateral upper trap use   LUMBAR  AROM: Flexion: 75% limited  Extension: 50% limited  Rotation R/L: 50% limited  Lateral flexion R/L: 50% limited  LOWER EXTREMITY AROM:  L ankle inversion: 20, eversion: 35  R ankle inversion: 15 , eversion: 25    LOWER EXTREMITY MMT:    MMT Right Eval Left Eval  Hip flexion 4- 4  Hip extension    Hip abduction    Hip adduction    Hip internal rotation    Hip external rotation    Knee flexion 4- 4+  Knee extension 4 4+  Ankle dorsiflexion 4- 4+  Ankle plantarflexion    Ankle inversion 3 4+  Ankle eversion 3 4-  (Blank  rows = not tested)  TRANSFERS: Assistive device utilized: Environmental consultant - 2 wheeled  Sit to stand: Modified independence Stand to sit: Modified independence  *poor hand placement keeping BUE's on RW for STS and stand to sit despite VC's and education for safe hand placement.   GAIT: Gait pattern: step through pattern, decreased stance time- Right, decreased ankle dorsiflexion- Right, and poor foot clearance- Right. Notable R ankle inversion in stance phase due to evertor weakness Distance walked: 10 meters Assistive device utilized: Walker - 2 wheeled Level of assistance: Modified independence Comments: Heavy BUE support on RW. PT adjusting RW height to reduced B shoulder shrug.   FUNCTIONAL TESTS:  5 times sit to stand: 29.37 seconds Timed up and go (TUG): 26.89 seconds 10 meter walk test: 0.48 m/s with RW  PATIENT SURVEYS:  FOTO 37 with target score of 43  TODAY'S TREATMENT: DATE: 01/31/23  There.ex: Nustep x5 minutes to increase LE strengthening and lumbar mobility lvl 3.0 Ankle four way w/ GTB (inversion, eversion, PF, DF) x12/ each way. Hands on  TC's on knee to prevent hip ER/IR compensation Seated heel raises w/ 8# DB on bilat distal thighs RLE/LLE  2 x10/ each side  Neuro re-ed:  STS to ambulation CGA with hurry cane in LUE 1 x25 down and back, 1 x 10' down and back. VC's for proper cane positioning transitioning w/ 2 point gait. Notable  decrease in step lengths with turning around cone.  Agility ladder ambulation CGA w/ hurry cane in LUE 1 x15' down and back (decreased step length of LLE noted w/ RLE deceased stance phase), 1 x15' down and back w/ hurry care in RUE (increase step length and speed to complete ambulation noted)  PATIENT EDUCATION: Education details: HEP, safe use of AD, POC Person educated: Patient Education method: Explanation, Demonstration, Verbal cues, and Handouts Education comprehension: verbalized understanding and needs further education  HOME EXERCISE PROGRAM: Access Code: [email or web address] URL: [email or web address] Date: 12/13/2022 Prepared by: Ronnie Derby  Exercises - Supine Bridge  - 1 x daily - 7 x weekly - 3 sets - 6 reps - Seated Scapular Retraction  - 1 x daily - 7 x weekly - 3 sets - 15 reps - Seated Ankle Circles  - 1 x daily - 7 x weekly - 3 sets - 15 reps  GOALS: Goals reviewed with patient? No  SHORT TERM GOALS: Target date: 12/24/22  Pt will be independent with HEP to improve RLE strength for transfers and gait Baseline: 12/13/22: Provided initial HEP Goal status: INITIAL  2.  Pt will demonstrate safe use of hand placement with transfers with LRAD to demonstrate reduced falls risk. Baseline: 12/13/22: BUE use on AD with STS and stand to sit transfers Goal status: INITIAL  LONG TERM GOALS: Target date: 03/07/23  Pt will improve FOTO to target score to demonstrate clinically significant improvement in functional mobility.  Baseline: 12/13/22: 37/43 Goal status: INITIAL  2.  Pt will complete TUG in 12 seconds or less with LRAD to demonstrate reduced falls risk with transfers and short distance gait Baseline: 12/13/22: 26.89 seconds w/ RW, 01/24/23: 44.20 seconds w/ 3 pronged hurry cane    Goal status: INITIAL  3.  Pt will improve 5xSTS to 11.4 seconds or less without UE support to demonstrate improved LE strength to match age based norms.  Baseline: : 12/13/22: 29.37  seconds Goal status: INITIAL  4.  Pt will improve FGA by at least 4 points to demonstrate clinically significant improvement in balance with  community activities.  Baseline: 12/13/22: deferred to next session; 12/29/22: 14/30 Goal status: INITIAL  5.  Pt will improve gait speed to at least 1.0 m/s with LRAD to demonstrate safe ability to complete short community distances. Baseline: 12/13/22: 0.48 m/s with RW Goal status: INITIAL  6. Pt will increase 6 MWT by > 165' to display improvements in functional endurance with community ambulation.   Baseline: 01/12/23: 429 ft w/ 7/10 NPS   ASSESSMENT:  CLINICAL IMPRESSION: Session focused on progressing pt's bilat LE strengthening and balance exercises. Pt notes improvements in LE strength noted w/ increased tolerance to activities at today's session including increased ambulation distance w/ hurry cane. Pt noting decreased step length of LLE, and decreased stance time of RLE when ambulating with hurry cane in LUE. Switched hurry cane to RUE and pt notes improved step length bilaterally, along w/ increased stance time of RLE. Pt's next visit is a progress note and will further examine pt's gait at next visit. Pt would benefit from continued PT to address remaining balance and strength deficits of the LE's and reduce falls risk.   OBJECTIVE IMPAIRMENTS: Abnormal gait, decreased balance, decreased mobility, difficulty walking, decreased ROM, decreased strength, impaired sensation, and postural dysfunction.   ACTIVITY LIMITATIONS: carrying, lifting, bending, standing, stairs, transfers, reach over head, and locomotion level  PARTICIPATION LIMITATIONS: cleaning, laundry, driving, shopping, and community activity  PERSONAL FACTORS: Age, Past/current experiences, and Time since onset of injury/illness/exacerbation are also affecting patient's functional outcome.   REHAB POTENTIAL: Good  CLINICAL DECISION MAKING: Evolving/moderate complexity  EVALUATION  COMPLEXITY: Moderate  PLAN:  PT FREQUENCY: 2x/week  PT DURATION: 12 weeks  PLANNED INTERVENTIONS: Therapeutic exercises, Therapeutic activity, Neuromuscular re-education, Balance training, Gait training, Patient/Family education, Self Care, Stair training, DME instructions, Manual therapy, and Re-evaluation  PLAN FOR NEXT SESSION: PROGRESS NOTE.  Inversion and heel strike exercises for RLE.    Lovie Macadamia, SPT  Delphia Grates. Fairly IV, PT, DPT Physical Therapist- [place]  [facility]

## 2023-02-02 ENCOUNTER — Ambulatory Visit: Payer: 59

## 2023-02-02 DIAGNOSIS — R262 Difficulty in walking, not elsewhere classified: Secondary | ICD-10-CM

## 2023-02-02 DIAGNOSIS — M6281 Muscle weakness (generalized): Secondary | ICD-10-CM

## 2023-02-02 LAB — CYTOLOGY - NON PAP

## 2023-02-02 NOTE — Therapy (Addendum)
OUTPATIENT PHYSICAL THERAPY NEURO TREATMENT/ PROGRESS NOTE  Dates of reporting period: 12/13/22- 02/02/23   Patient Name: Susan [name] MRN: [id number] DOB:1960-06-15, 62 y.o., female Today's Date: 02/02/2023   PCP: [name] MD REFERRING PROVIDER: Venetia Night MD  END OF SESSION:  PT End of Session - 02/02/23 1031     Visit Number 10    Number of Visits 25    Date for PT Re-Evaluation 03/07/23    PT Start Time 1031    PT Stop Time 1112    PT Time Calculation (min) 41 min    Equipment Utilized During Treatment Gait belt    Activity Tolerance Patient tolerated treatment well    Behavior During Therapy WFL for tasks assessed/performed             Past Medical History:  Diagnosis Date   ADD (attention deficit disorder)    Alcohol abuse    Anemia    Anxiety    Aortic atherosclerosis (HCC) 02/20/2018   Chest CT Sept 2019   Arthritis    rheumatoid arthritis   Asthma    Bipolar disorder (HCC)    Centrilobular emphysema (HCC)    Cocaine use disorder, moderate, in sustained remission (HCC)    Constipation    Degenerative disc disease at L5-S1 level 09/28/2016   See ortho note May 2018   Depression    bipolar, hx of suicide attempt   Diabetes mellitus, type 2 (HCC)    Diverticulitis 2013   DOE (dyspnea on exertion)    Drug use    Family history of adverse reaction to anesthesia    mom-delayed emergence   GERD (gastroesophageal reflux disease)    H/O suicide attempt    slit wrists   Hepatitis C 06/26/2014   treated with Harvoni   Hip pain    History of echocardiogram    a. 04/2019 Echo: EF >65%, nl RV fxn.   History of MRSA infection 2013   HLD (hyperlipidemia)    Hypertension    a. 06/2021 Renal Duplex: ? bilat RAS; b. 06/2021 CTA Abd: No signif RAS.   Hyponatremia    Hypothyroidism    Incisional hernia 11/08/2012   Knee pain    Left carotid bruit    Morbid obesity (HCC)    Multinodular thyroid    OSA (obstructive sleep apnea)    OSA on  CPAP    Osteoporosis    Palpitations    Post-traumatic osteoarthritis of right knee 09/24/2015   Recurrent ventral hernia 11/08/2012   Status post total right knee replacement using cement 05/10/2016   Stroke (HCC) 07/05/2022   right arm,torso and right leg numbness   Tobacco use disorder    Vitamin B12 deficiency    Vitamin D deficiency disease    Past Surgical History:  Procedure Laterality Date   BILATERAL SALPINGOOPHORECTOMY     due to abnormal mass   BREAST BIOPSY Left    neg   BREAST SURGERY Left 20 yrs ago   CESAREAN SECTION     COLONOSCOPY     COLONOSCOPY WITH PROPOFOL N/A 10/16/2017   Procedure: COLONOSCOPY WITH PROPOFOL;  Surgeon: Wyline Mood, MD;  Location: [facility] ENDOSCOPY;  Service: Gastroenterology;  Laterality: N/A;   COLONOSCOPY WITH PROPOFOL N/A 02/16/2021   Procedure: COLONOSCOPY WITH PROPOFOL;  Surgeon: Wyline Mood, MD;  Location: [facility] ENDOSCOPY;  Service: Gastroenterology;  Laterality: N/A;   FORAMINOTOMY 1 LEVEL Right 10/10/2022   Procedure: RIGHT L4-5 LAMINOFORAMINOTOMY;  Surgeon: Venetia Night, MD;  Location: [facility]  ORS;  Service: Neurosurgery;  Laterality: Right;   HERNIA REPAIR  06/2011, July 2014   Ventral wall repair with Physiomesh   HERNIA REPAIR     2nd.vental wall repair   JOINT REPLACEMENT Right    knee   LUMBAR LAMINECTOMY/ DECOMPRESSION WITH MET-RX N/A 10/12/2022   Procedure: LUMBAR LAMINECTOMY/ DECOMPRESSION WITH MET-RX;  Surgeon: Venetia Night, MD;  Location: [facility] ORS;  Service: Neurosurgery;  Laterality: N/A;   TONSILLECTOMY     TOTAL HIP ARTHROPLASTY Left 07/23/2019   Procedure: TOTAL HIP ARTHROPLASTY;  Surgeon: Christena Flake, MD;  Location: [facility] ORS;  Service: Orthopedics;  Laterality: Left;   TOTAL KNEE ARTHROPLASTY Right 05/10/2016   Procedure: TOTAL KNEE ARTHROPLASTY;  Surgeon: Christena Flake, MD;  Location: [facility] ORS;  Service: Orthopedics;  Laterality: Right;   TUBAL LIGATION     Patient Active Problem List   Diagnosis Date Noted    Blister of foot without infection, right, initial encounter 01/05/2023   Bipolar 1 disorder, depressed, moderate (HCC) 11/29/2022   Acute respiratory failure with hypoxia (HCC) 10/13/2022   AKI (acute kidney injury) (HCC) 10/13/2022   Anemia 10/13/2022   Cauda equina syndrome (HCC) 10/12/2022   Epidural hematoma (HCC) 10/12/2022   Mass in epidural space 10/12/2022   Synovial cyst of lumbar facet joint 10/10/2022   Lumbar radiculopathy 10/10/2022   S/P laminectomy 10/10/2022   Preop examination 09/28/2022   Epigastric pain 07/22/2022   Enlarged thyroid 07/22/2022   Chronic diarrhea 07/22/2022   CVA (cerebrovascular accident) (HCC) 07/05/2022   Skin lesion 06/28/2022   BMI 38.0-38.9,adult 07/14/2021   Type 2 diabetes mellitus without complication, without long-term current use of insulin (HCC) 06/16/2021   H/O thyroid nodule 05/31/2021   History of hepatitis C- treated 2014- 2015 with [facility] gastroenterology and followed.  05/19/2021   Left carotid bruit 04/28/2021   Anxiety 05/28/2020   Bipolar affective disorder in remission (HCC) 05/28/2020   Bipolar disorder, in full remission, most recent episode mixed (HCC) 03/06/2020   Need for immunization against influenza 01/27/2020   Routine physical examination 01/27/2020   Morbid obesity (HCC) 01/27/2020   Osteoporosis 11/06/2019   Bipolar 1 disorder, mixed, mild (HCC) 10/29/2019   PTSD (post-traumatic stress disorder) 09/27/2019   Bereavement 09/27/2019   Cocaine use disorder, moderate, in sustained remission (HCC) 09/27/2019   Status post total hip replacement, left 07/23/2019   Primary osteoarthritis of left hip 06/14/2019   Depression 10/23/2018   Allergic rhinitis 08/23/2018   Aortic atherosclerosis (HCC) 02/20/2018   Centrilobular emphysema (HCC) 02/20/2018   Hyperlipidemia 03/28/2017   Degenerative disc disease at L5-S1 level 09/28/2016   Tobacco use disorder 04/21/2016   GERD (gastroesophageal reflux disease) 12/17/2015    Chronic back pain 10/09/2015   Post-traumatic osteoarthritis of right knee 09/24/2015   Alcohol use disorder, severe, in sustained remission (HCC) 04/13/2015   Vitamin D deficiency 04/09/2015   Vitamin B12 deficiency 04/09/2015   OSA on CPAP 04/09/2015   Subclinical hyperthyroidism 06/26/2014   Goiter colloid, toxic, nodular 06/26/2014   Hypertension 06/26/2014   Tobacco use disorder, continuous 06/26/2014   Bipolar I disorder (HCC) 06/26/2014   Lumbar radiculitis 10/03/2013   Diverticulosis 08/24/2013    ONSET DATE: 10/12/22  REFERRING DIAG: Y86.578 (ICD-10-CM) - Status post lumbar spine surgery for decompression of spinal cord G83.4 (ICD-10-CM) - Cauda equina syndrome (HCC)  THERAPY DIAG:  Muscle weakness (generalized)  Difficulty in walking, not elsewhere classified  Rationale for Evaluation and Treatment: Rehabilitation  SUBJECTIVE:  SUBJECTIVE STATEMENT:  Pt reports no LBP at today's session. Pr reports some neck pain following a biopsy on her thyroid.   Pt accompanied by: self  PERTINENT HISTORY:  Abri Dayal is a 62 y/o F with PMH: HTN, DM2, HLD, ETOH use, THA, PTSD, cocaine, bipolar, and L thalamic infarct (06/2022), s/p lumbar laminoforaminotomy (10/10/22). Presenting post L3-5 decompression with evacuation of epidural hematoma on 10/12/22 for Cauda Equina Syndrome. Pt has residual RLE deficits after her lumbar surgeries leading to ambulating with RW. She was independent prior to her surgeries now reporting balance and gait difficulties. Has had some Worsening RUE N/T since her CVA in February after her lumbar surgeries. Reports N/T 6-7/10 on severity. Mentions some falls when not using her RW. Feels like she always has to have a hand on something to steady herself.  Pt has residual RLE  deficits after her lumbar surgeries leading to ambulating with RW. She was independent prior to her surgeries now reporting balance and gait difficulties. Has had some Worsening RUE N/T since her CVA in February after her lumbar surgeries. Reports N/T 6-7/10 on severity. Mentions some falls when not using her RW. Feels like she always has to have a hand on something to steady herself. Has had some falls anterior standing too close in the RW, a fall walking up the stairs with the RW. She is able to get herself up. Pt describing some unsafe scenarios with poor RW use with transfers and asc/desc curbs.   PAIN:  Are you having pain? No  PRECAUTIONS: Back  RED FLAGS: Bowel or bladder incontinence: Yes: MD aware since surgery.     WEIGHT BEARING RESTRICTIONS: No  FALLS: Has patient fallen in last 6 months? Yes. Number of falls 3  LIVING ENVIRONMENT: Lives with: lives with their son   PLOF: Independent  PATIENT GOALS: Improve her walking and   OBJECTIVE:   DIAGNOSTIC FINDINGS: CLINICAL DATA:  Elective surgery   EXAM: LUMBAR SPINE - 2-3 VIEW   COMPARISON:  10/10/2022   FINDINGS: Intraoperative fluoroscopy is utilized for surgical control purposes. Fluoroscopy time is recorded at 6 seconds. Dose 5.33 mGy. Two spot fluoroscopic images are obtained. Initial image demonstrates a localization marker posterior to the L4 vertebra. Second image demonstrates a localization marker posterior to the L3-4 interspace level.   IMPRESSION: Intraoperative fluoroscopy utilized for surgical control purposes for lumbar spine localization.     Electronically Signed   By: Burman Nieves M.D.   On: 10/12/2022 20:24  COGNITION: Overall cognitive status: Within functional limits for tasks assessed   SENSATION: Light touch: Impaired  RLE: impaired L2, L4-L5  COORDINATION: Heel to shin normal bilaterally   PROPRIOCEPTION: Normal bilat   POSTURE:  Heavy BUE support on RW with standing  tasks with bilateral upper trap use   LUMBAR AROM: Flexion: 75% limited  Extension: 50% limited  Rotation R/L: 50% limited  Lateral flexion R/L: 50% limited  LOWER EXTREMITY AROM:  L ankle inversion: 20, eversion: 35  R ankle inversion: 15 , eversion: 25    LOWER EXTREMITY MMT:    MMT Right Eval Left Eval  Hip flexion 4- 4  Hip extension    Hip abduction    Hip adduction    Hip internal rotation    Hip external rotation    Knee flexion 4- 4+  Knee extension 4 4+  Ankle dorsiflexion 4- 4+  Ankle plantarflexion    Ankle inversion 3 4+  Ankle eversion 3 4-  (Blank rows = not  tested)  TRANSFERS: Assistive device utilized: Environmental consultant - 2 wheeled  Sit to stand: Modified independence Stand to sit: Modified independence  *poor hand placement keeping BUE's on RW for STS and stand to sit despite VC's and education for safe hand placement.   GAIT: Gait pattern: step through pattern, decreased stance time- Right, decreased ankle dorsiflexion- Right, and poor foot clearance- Right. Notable R ankle inversion in stance phase due to evertor weakness Distance walked: 10 meters Assistive device utilized: Walker - 2 wheeled Level of assistance: Modified independence Comments: Heavy BUE support on RW. PT adjusting RW height to reduced B shoulder shrug.   FUNCTIONAL TESTS:  5 times sit to stand: 29.37 seconds Timed up and go (TUG): 26.89 seconds 10 meter walk test: 0.48 m/s with RW  PATIENT SURVEYS:  FOTO 37 with target score of 43  TODAY'S TREATMENT: DATE: 02/02/23  Full session spent reviewing goals a pt requiring progress note. See clinical impression and goals section for details.    PATIENT EDUCATION: Education details: HEP, safe use of AD, POC Person educated: Patient Education method: Explanation, Demonstration, Verbal cues, and Handouts Education comprehension: verbalized understanding and needs further education  HOME EXERCISE PROGRAM: Access Code: [email or web address] URL:  [email or web address] Date: 12/13/2022 Prepared by: Ronnie Derby  Exercises - Supine Bridge  - 1 x daily - 7 x weekly - 3 sets - 6 reps - Seated Scapular Retraction  - 1 x daily - 7 x weekly - 3 sets - 15 reps - Seated Ankle Circles  - 1 x daily - 7 x weekly - 3 sets - 15 reps  GOALS: Goals reviewed with patient? No  SHORT TERM GOALS: Target date: 12/24/22  Pt will be independent with HEP to improve RLE strength for transfers and gait Baseline: 12/13/22: Provided initial HEP 02/02/23: HEP compliant Goal status: MET  2.  Pt will demonstrate safe use of hand placement with transfers with LRAD to demonstrate reduced falls risk. Baseline: 12/13/22: BUE use on AD with STS and stand to sit transfers 02/02/23: Correct hand placement noted  Goal status: MET  LONG TERM GOALS: Target date: 03/07/23  Pt will improve FOTO to target score to demonstrate clinically significant improvement in functional mobility.  Baseline: 12/13/22: 37/43 02/02/23: 43/43 Goal status: MET  2.  Pt will complete TUG in 12 seconds or less with LRAD to demonstrate reduced falls risk with transfers and short distance gait Baseline: 12/13/22: 26.89 seconds w/ RW, 01/24/23: 44.20 seconds w/ 3 pronged hurry cane 02/02/23: 31.05sec with hurry cane Goal status: ONGOING  3.  Pt will improve 5xSTS to 11.4 seconds or less without UE support to demonstrate improved LE strength to match age based norms.  Baseline: : 12/13/22: 29.37 seconds 02/02/23: 20.20 seconds Goal status: ONGOING  4.  Pt will improve FGA by at least 4 points to demonstrate clinically significant improvement in balance with community activities.  Baseline: 12/13/22: deferred to next session; 12/29/22: 13/30; 02/02/23:14/30 Goal status: ONGOING  5.  Pt will improve gait speed to at least 1.0 m/s with LRAD to demonstrate safe ability to complete short community distances. Baseline: 12/13/22: 0.48 m/s with RW;  02/02/23: 0.32 m/s w/ hurry cane Goal status:  ONGOING  6. Pt will increase 6 MWT by > 165' to display improvements in functional endurance with community ambulation.   Baseline: 01/12/23: 429 ft w/ 7/10 NPS 02/02/23: Defer to next session   Goal status: ONGOING   ASSESSMENT:  CLINICAL IMPRESSION: Session focused on reviewing pt's STG's and LTG's  as pt requiring progress note for 10th visit. Pt notes progression in all goals, achieving her HEP compliance goal, proper hand placement during transfers, and meeting her target FOTO score. Pt shows improvements in the ability to utilize her hurry cane during functional assessments including the TUG and (0.63m/s). Pt notes progression in TUG time, 5xSTS time and FGA score (see above). Pt continues to note gait deficits when ambulating w/ hurry cane along w/ weakness of bilat LE's and though she is making progress, pt would continue to benefit from skilled PT interventions to address remaining gait and strength deficits along w/ reducing fall risk.   OBJECTIVE IMPAIRMENTS: Abnormal gait, decreased balance, decreased mobility, difficulty walking, decreased ROM, decreased strength, impaired sensation, and postural dysfunction.   ACTIVITY LIMITATIONS: carrying, lifting, bending, standing, stairs, transfers, reach over head, and locomotion level  PARTICIPATION LIMITATIONS: cleaning, laundry, driving, shopping, and community activity  PERSONAL FACTORS: Age, Past/current experiences, and Time since onset of injury/illness/exacerbation are also affecting patient's functional outcome.   REHAB POTENTIAL: Good  CLINICAL DECISION MAKING: Evolving/moderate complexity  EVALUATION COMPLEXITY: Moderate  PLAN:  PT FREQUENCY: 2x/week  PT DURATION: 12 weeks  PLANNED INTERVENTIONS: Therapeutic exercises, Therapeutic activity, Neuromuscular re-education, Balance training, Gait training, Patient/Family education, Self Care, Stair training, DME instructions, Manual therapy, and Re-evaluation  PLAN FOR  NEXT SESSION: , Inversion and heel strike exercises for RLE.    Lovie Macadamia, SPT  Delphia Grates. Fairly IV, PT, DPT Physical Therapist-   [facility]

## 2023-02-03 DIAGNOSIS — E041 Nontoxic single thyroid nodule: Secondary | ICD-10-CM | POA: Diagnosis not present

## 2023-02-06 ENCOUNTER — Telehealth: Payer: Self-pay | Admitting: Family Medicine

## 2023-02-06 NOTE — Telephone Encounter (Signed)
Please see the phone message on this.  This should not have been refilled.  There is a prior message in this encounter that states she should be taking vitamin D 2000 international units daily.  Please contact the mail-order pharmacy and cancel the vitamin D 50,000 international unit prescription.

## 2023-02-06 NOTE — Telephone Encounter (Signed)
This patient had vitamin D 50,000 international units refilled today.  This should not have been refilled.  She was supposed to be taking vitamin D 2000 international units once daily over-the-counter.  Please cancel the 50,000 international unit refill through the mail order pharmacy and make sure the patient has been taking the 2000 international units once daily over-the-counter.  Thanks.

## 2023-02-06 NOTE — Telephone Encounter (Signed)
Prescription was cancelled and Patient is aware to pick up OTC Vitamin D and take 2000 international units daily.

## 2023-02-07 ENCOUNTER — Ambulatory Visit: Payer: 59 | Attending: Neurosurgery

## 2023-02-07 DIAGNOSIS — M6281 Muscle weakness (generalized): Secondary | ICD-10-CM | POA: Insufficient documentation

## 2023-02-07 DIAGNOSIS — R262 Difficulty in walking, not elsewhere classified: Secondary | ICD-10-CM | POA: Diagnosis not present

## 2023-02-07 NOTE — Therapy (Addendum)
OUTPATIENT PHYSICAL THERAPY NEURO TREATMENT  Patient Name: ROSEZELLA BARMES MRN: [id number] DOB:05-07-1961, 62 y.o., female Today's Date: 02/07/2023   PCP: [name] MD REFERRING PROVIDER: Venetia Night MD  END OF SESSION:  PT End of Session - 02/07/23 1111     Visit Number 11    Number of Visits 25    Date for PT Re-Evaluation 03/07/23    PT Start Time 1114    PT Stop Time 1200    PT Time Calculation (min) 46 min    Equipment Utilized During Treatment Gait belt    Activity Tolerance Patient tolerated treatment well    Behavior During Therapy WFL for tasks assessed/performed             Past Medical History:  Diagnosis Date   ADD (attention deficit disorder)    Alcohol abuse    Anemia    Anxiety    Aortic atherosclerosis (HCC) 02/20/2018   Chest CT Sept 2019   Arthritis    rheumatoid arthritis   Asthma    Bipolar disorder (HCC)    Centrilobular emphysema (HCC)    Cocaine use disorder, moderate, in sustained remission (HCC)    Constipation    Degenerative disc disease at L5-S1 level 09/28/2016   See ortho note May 2018   Depression    bipolar, hx of suicide attempt   Diabetes mellitus, type 2 (HCC)    Diverticulitis 2013   DOE (dyspnea on exertion)    Drug use    Family history of adverse reaction to anesthesia    mom-delayed emergence   GERD (gastroesophageal reflux disease)    H/O suicide attempt    slit wrists   Hepatitis C 06/26/2014   treated with Harvoni   Hip pain    History of echocardiogram    a. 04/2019 Echo: EF >65%, nl RV fxn.   History of MRSA infection 2013   HLD (hyperlipidemia)    Hypertension    a. 06/2021 Renal Duplex: ? bilat RAS; b. 06/2021 CTA Abd: No signif RAS.   Hyponatremia    Hypothyroidism    Incisional hernia 11/08/2012   Knee pain    Left carotid bruit    Morbid obesity (HCC)    Multinodular thyroid    OSA (obstructive sleep apnea)    OSA on CPAP    Osteoporosis    Palpitations    Post-traumatic  osteoarthritis of right knee 09/24/2015   Recurrent ventral hernia 11/08/2012   Status post total right knee replacement using cement 05/10/2016   Stroke (HCC) 07/05/2022   right arm,torso and right leg numbness   Tobacco use disorder    Vitamin B12 deficiency    Vitamin D deficiency disease    Past Surgical History:  Procedure Laterality Date   BILATERAL SALPINGOOPHORECTOMY     due to abnormal mass   BREAST BIOPSY Left    neg   BREAST SURGERY Left 20 yrs ago   CESAREAN SECTION     COLONOSCOPY     COLONOSCOPY WITH PROPOFOL N/A 10/16/2017   Procedure: COLONOSCOPY WITH PROPOFOL;  Surgeon: Wyline Mood, MD;  Location: [facility] ENDOSCOPY;  Service: Gastroenterology;  Laterality: N/A;   COLONOSCOPY WITH PROPOFOL N/A 02/16/2021   Procedure: COLONOSCOPY WITH PROPOFOL;  Surgeon: Wyline Mood, MD;  Location: [facility] ENDOSCOPY;  Service: Gastroenterology;  Laterality: N/A;   FORAMINOTOMY 1 LEVEL Right 10/10/2022   Procedure: RIGHT L4-5 LAMINOFORAMINOTOMY;  Surgeon: Venetia Night, MD;  Location: [facility] ORS;  Service: Neurosurgery;  Laterality: Right;   HERNIA  REPAIR  06/2011, July 2014   Ventral wall repair with Physiomesh   HERNIA REPAIR     2nd.vental wall repair   JOINT REPLACEMENT Right    knee   LUMBAR LAMINECTOMY/ DECOMPRESSION WITH MET-RX N/A 10/12/2022   Procedure: LUMBAR LAMINECTOMY/ DECOMPRESSION WITH MET-RX;  Surgeon: Venetia Night, MD;  Location: [facility] ORS;  Service: Neurosurgery;  Laterality: N/A;   TONSILLECTOMY     TOTAL HIP ARTHROPLASTY Left 07/23/2019   Procedure: TOTAL HIP ARTHROPLASTY;  Surgeon: Christena Flake, MD;  Location: [facility] ORS;  Service: Orthopedics;  Laterality: Left;   TOTAL KNEE ARTHROPLASTY Right 05/10/2016   Procedure: TOTAL KNEE ARTHROPLASTY;  Surgeon: Christena Flake, MD;  Location: [facility] ORS;  Service: Orthopedics;  Laterality: Right;   TUBAL LIGATION     Patient Active Problem List   Diagnosis Date Noted   Blister of foot without infection, right, initial encounter  01/05/2023   Bipolar 1 disorder, depressed, moderate (HCC) 11/29/2022   Acute respiratory failure with hypoxia (HCC) 10/13/2022   AKI (acute kidney injury) (HCC) 10/13/2022   Anemia 10/13/2022   Cauda equina syndrome (HCC) 10/12/2022   Epidural hematoma (HCC) 10/12/2022   Mass in epidural space 10/12/2022   Synovial cyst of lumbar facet joint 10/10/2022   Lumbar radiculopathy 10/10/2022   S/P laminectomy 10/10/2022   Preop examination 09/28/2022   Epigastric pain 07/22/2022   Enlarged thyroid 07/22/2022   Chronic diarrhea 07/22/2022   CVA (cerebrovascular accident) (HCC) 07/05/2022   Skin lesion 06/28/2022   BMI 38.0-38.9,adult 07/14/2021   Type 2 diabetes mellitus without complication, without long-term current use of insulin (HCC) 06/16/2021   H/O thyroid nodule 05/31/2021   History of hepatitis C- treated 2014- 2015 with [facility] gastroenterology and followed.  05/19/2021   Left carotid bruit 04/28/2021   Anxiety 05/28/2020   Bipolar affective disorder in remission (HCC) 05/28/2020   Bipolar disorder, in full remission, most recent episode mixed (HCC) 03/06/2020   Need for immunization against influenza 01/27/2020   Routine physical examination 01/27/2020   Morbid obesity (HCC) 01/27/2020   Osteoporosis 11/06/2019   Bipolar 1 disorder, mixed, mild (HCC) 10/29/2019   PTSD (post-traumatic stress disorder) 09/27/2019   Bereavement 09/27/2019   Cocaine use disorder, moderate, in sustained remission (HCC) 09/27/2019   Status post total hip replacement, left 07/23/2019   Primary osteoarthritis of left hip 06/14/2019   Depression 10/23/2018   Allergic rhinitis 08/23/2018   Aortic atherosclerosis (HCC) 02/20/2018   Centrilobular emphysema (HCC) 02/20/2018   Hyperlipidemia 03/28/2017   Degenerative disc disease at L5-S1 level 09/28/2016   Tobacco use disorder 04/21/2016   GERD (gastroesophageal reflux disease) 12/17/2015   Chronic back pain 10/09/2015   Post-traumatic osteoarthritis  of right knee 09/24/2015   Alcohol use disorder, severe, in sustained remission (HCC) 04/13/2015   Vitamin D deficiency 04/09/2015   Vitamin B12 deficiency 04/09/2015   OSA on CPAP 04/09/2015   Subclinical hyperthyroidism 06/26/2014   Goiter colloid, toxic, nodular 06/26/2014   Hypertension 06/26/2014   Tobacco use disorder, continuous 06/26/2014   Bipolar I disorder (HCC) 06/26/2014   Lumbar radiculitis 10/03/2013   Diverticulosis 08/24/2013    ONSET DATE: 10/12/22  REFERRING DIAG: W09.811 (ICD-10-CM) - Status post lumbar spine surgery for decompression of spinal cord G83.4 (ICD-10-CM) - Cauda equina syndrome (HCC)  THERAPY DIAG:  Muscle weakness (generalized)  Difficulty in walking, not elsewhere classified  Rationale for Evaluation and Treatment: Rehabilitation  SUBJECTIVE:  SUBJECTIVE STATEMENT:   Pt reports no LBP at today's session. Pt states she was able to walk around her house with just her cane over the weekend. No notable changes since last session.   Pt accompanied by: self  PERTINENT HISTORY:  Dashell Ryon is a 62 y/o F with PMH: HTN, DM2, HLD, ETOH use, THA, PTSD, cocaine, bipolar, and L thalamic infarct (06/2022), s/p lumbar laminoforaminotomy (10/10/22). Presenting post L3-5 decompression with evacuation of epidural hematoma on 10/12/22 for Cauda Equina Syndrome. Pt has residual RLE deficits after her lumbar surgeries leading to ambulating with RW. She was independent prior to her surgeries now reporting balance and gait difficulties. Has had some Worsening RUE N/T since her CVA in February after her lumbar surgeries. Reports N/T 6-7/10 on severity. Mentions some falls when not using her RW. Feels like she always has to have a hand on something to steady herself.  Pt has residual RLE  deficits after her lumbar surgeries leading to ambulating with RW. She was independent prior to her surgeries now reporting balance and gait difficulties. Has had some Worsening RUE N/T since her CVA in February after her lumbar surgeries. Reports N/T 6-7/10 on severity. Mentions some falls when not using her RW. Feels like she always has to have a hand on something to steady herself. Has had some falls anterior standing too close in the RW, a fall walking up the stairs with the RW. She is able to get herself up. Pt describing some unsafe scenarios with poor RW use with transfers and asc/desc curbs.   PAIN:  Are you having pain? No  PRECAUTIONS: Back  RED FLAGS: Bowel or bladder incontinence: Yes: MD aware since surgery.     WEIGHT BEARING RESTRICTIONS: No  FALLS: Has patient fallen in last 6 months? Yes. Number of falls 3  LIVING ENVIRONMENT: Lives with: lives with their son   PLOF: Independent  PATIENT GOALS: Improve her walking and   OBJECTIVE:   DIAGNOSTIC FINDINGS: CLINICAL DATA:  Elective surgery   EXAM: LUMBAR SPINE - 2-3 VIEW   COMPARISON:  10/10/2022   FINDINGS: Intraoperative fluoroscopy is utilized for surgical control purposes. Fluoroscopy time is recorded at 6 seconds. Dose 5.33 mGy. Two spot fluoroscopic images are obtained. Initial image demonstrates a localization marker posterior to the L4 vertebra. Second image demonstrates a localization marker posterior to the L3-4 interspace level.   IMPRESSION: Intraoperative fluoroscopy utilized for surgical control purposes for lumbar spine localization.     Electronically Signed   By: Burman Nieves M.D.   On: 10/12/2022 20:24  COGNITION: Overall cognitive status: Within functional limits for tasks assessed   SENSATION: Light touch: Impaired  RLE: impaired L2, L4-L5  COORDINATION: Heel to shin normal bilaterally   PROPRIOCEPTION: Normal bilat   POSTURE:  Heavy BUE support on RW with standing  tasks with bilateral upper trap use   LUMBAR AROM: Flexion: 75% limited  Extension: 50% limited  Rotation R/L: 50% limited  Lateral flexion R/L: 50% limited  LOWER EXTREMITY AROM:  L ankle inversion: 20, eversion: 35  R ankle inversion: 15 , eversion: 25    LOWER EXTREMITY MMT:    MMT Right Eval Left Eval  Hip flexion 4- 4  Hip extension    Hip abduction    Hip adduction    Hip internal rotation    Hip external rotation    Knee flexion 4- 4+  Knee extension 4 4+  Ankle dorsiflexion 4- 4+  Ankle plantarflexion    Ankle  inversion 3 4+  Ankle eversion 3 4-  (Blank rows = not tested)  TRANSFERS: Assistive device utilized: Environmental consultant - 2 wheeled  Sit to stand: Modified independence Stand to sit: Modified independence  *poor hand placement keeping BUE's on RW for STS and stand to sit despite VC's and education for safe hand placement.   GAIT: Gait pattern: step through pattern, decreased stance time- Right, decreased ankle dorsiflexion- Right, and poor foot clearance- Right. Notable R ankle inversion in stance phase due to evertor weakness Distance walked: 10 meters Assistive device utilized: Walker - 2 wheeled Level of assistance: Modified independence Comments: Heavy BUE support on RW. PT adjusting RW height to reduced B shoulder shrug.   FUNCTIONAL TESTS:  5 times sit to stand: 29.37 seconds Timed up and go (TUG): 26.89 seconds 10 meter walk test: 0.48 m/s with RW  PATIENT SURVEYS:  FOTO 37 with target score of 43  TODAY'S TREATMENT: DATE: 02/07/23  There-ex:  Nustep x5 minutes to increase LE strengthening and lumbar mobility lvl 5.0 seat 8  Seated heel raises w/ 8# DB on bilat distal thighs 2 x10/each side STS from plinth table w/ no UE support 2 x10  Neuro re-ed:   STS to ambulation CGA with hurry cane in LUE 2 x25 down and back. VC's for proper cane positioning transitioning w/ 2 point gait. Notable decrease in step lengths with turning around cone and  significant over pronation noted of RLE.   Obstacle course with, agility ladder ambulation CGA w/ hurry cane in RUE, stepping over 6" obstacle x2, stepping onto airex pad. 3 x15' down and back   Stepping down from airex pad w/ RLE on airex, LLE stepping forward to assess over pronation of RLE x8; rigid wooden piece place under medial RLE noting correction of over pronation x8  PATIENT EDUCATION: Education details: HEP, safe use of AD, POC Person educated: Patient Education method: Explanation, Demonstration, Verbal cues, and Handouts Education comprehension: verbalized understanding and needs further education  HOME EXERCISE PROGRAM: Access Code: [email or web address] URL: [email or web address] Date: 12/13/2022 Prepared by: Ronnie Derby  Exercises - Supine Bridge  - 1 x daily - 7 x weekly - 3 sets - 6 reps - Seated Scapular Retraction  - 1 x daily - 7 x weekly - 3 sets - 15 reps - Seated Ankle Circles  - 1 x daily - 7 x weekly - 3 sets - 15 reps  GOALS: Goals reviewed with patient? No  SHORT TERM GOALS: Target date: 12/24/22  Pt will be independent with HEP to improve RLE strength for transfers and gait Baseline: 12/13/22: Provided initial HEP 02/02/23: HEP compliant Goal status: MET  2.  Pt will demonstrate safe use of hand placement with transfers with LRAD to demonstrate reduced falls risk. Baseline: 12/13/22: BUE use on AD with STS and stand to sit transfers 02/02/23: Correct hand placement noted  Goal status: MET  LONG TERM GOALS: Target date: 03/07/23  Pt will improve FOTO to target score to demonstrate clinically significant improvement in functional mobility.  Baseline: 12/13/22: 37/43 02/02/23: 43/43 Goal status: MET  2.  Pt will complete TUG in 12 seconds or less with LRAD to demonstrate reduced falls risk with transfers and short distance gait Baseline: 12/13/22: 26.89 seconds w/ RW, 01/24/23: 44.20 seconds w/ 3 pronged hurry cane 02/02/23: 31.05sec with hurry cane Goal  status: ONGOING  3.  Pt will improve 5xSTS to 11.4 seconds or less without UE support to demonstrate improved LE strength to match age based  norms.  Baseline: : 12/13/22: 29.37 seconds 02/02/23: 20.20 seconds Goal status: ONGOING  4.  Pt will improve FGA by at least 4 points to demonstrate clinically significant improvement in balance with community activities.  Baseline: 12/13/22: deferred to next session; 12/29/22: 13/30; 02/02/23:14/30 Goal status: ONGOING  5.  Pt will improve gait speed to at least 1.0 m/s with LRAD to demonstrate safe ability to complete short community distances. Baseline: 12/13/22: 0.48 m/s with RW;  02/02/23: 0.32 m/s w/ hurry cane Goal status: ONGOING  6. Pt will increase 6 MWT by > 165' to display improvements in functional endurance with community ambulation.   Baseline: 01/12/23: 429 ft w/ 7/10 NPS 02/02/23: Defer to next session   Goal status: ONGOING   ASSESSMENT:  CLINICAL IMPRESSION:  Session focused on progressing bilat LE strengthening and gait training w/ hurry cane. Pt notes improvements in gait training noting increased cadence when using hurry cane to ambulate, along w/ improved LE endurance ambulating further distances at today's session. Pt continues to note significant R foot over pronation w/ SLS on the RLE during the gait cycle that was corrected w/ a rigid wooden piece placed at the medial aspect of the R foot. Pt educated on potentially using an over pronation insert to correct this issue. Pt would continue to benefit from skilled PT interventions to address remaining gait and strength deficits along w/ reducing fall risk.   OBJECTIVE IMPAIRMENTS: Abnormal gait, decreased balance, decreased mobility, difficulty walking, decreased ROM, decreased strength, impaired sensation, and postural dysfunction.   ACTIVITY LIMITATIONS: carrying, lifting, bending, standing, stairs, transfers, reach over head, and locomotion level  PARTICIPATION LIMITATIONS: cleaning,  laundry, driving, shopping, and community activity  PERSONAL FACTORS: Age, Past/current experiences, and Time since onset of injury/illness/exacerbation are also affecting patient's functional outcome.   REHAB POTENTIAL: Good  CLINICAL DECISION MAKING: Evolving/moderate complexity  EVALUATION COMPLEXITY: Moderate  PLAN:  PT FREQUENCY: 2x/week  PT DURATION: 12 weeks  PLANNED INTERVENTIONS: Therapeutic exercises, Therapeutic activity, Neuromuscular re-education, Balance training, Gait training, Patient/Family education, Self Care, Stair training, DME instructions, Manual therapy, and Re-evaluation  PLAN FOR NEXT SESSION: , Inversion and heel strike exercises for RLE.    Lovie Macadamia, SPT  Nolon Bussing, PT, DPT Physical Therapist - [facility]  02/07/23, 2:09 PM

## 2023-02-09 ENCOUNTER — Other Ambulatory Visit: Payer: Self-pay | Admitting: Family Medicine

## 2023-02-09 ENCOUNTER — Other Ambulatory Visit: Payer: Self-pay

## 2023-02-09 ENCOUNTER — Other Ambulatory Visit: Payer: Self-pay | Admitting: Orthopedic Surgery

## 2023-02-09 ENCOUNTER — Ambulatory Visit: Payer: 59

## 2023-02-09 DIAGNOSIS — M5416 Radiculopathy, lumbar region: Secondary | ICD-10-CM

## 2023-02-09 DIAGNOSIS — I10 Essential (primary) hypertension: Secondary | ICD-10-CM

## 2023-02-09 DIAGNOSIS — Z9889 Other specified postprocedural states: Secondary | ICD-10-CM

## 2023-02-09 NOTE — Telephone Encounter (Signed)
I left the patient a detailed message that her refill has been sent to the pharmacy.

## 2023-02-09 NOTE — Telephone Encounter (Signed)
Celebrex refill okay. Would be out in a few days. Let her know I sent it to the pharmacy.

## 2023-02-13 ENCOUNTER — Encounter: Payer: 59 | Attending: Physical Medicine and Rehabilitation | Admitting: Physical Medicine and Rehabilitation

## 2023-02-13 ENCOUNTER — Encounter: Payer: Self-pay | Admitting: Physical Medicine and Rehabilitation

## 2023-02-13 VITALS — BP 112/64 | HR 67 | Ht 61.0 in | Wt 223.0 lb

## 2023-02-13 DIAGNOSIS — K5900 Constipation, unspecified: Secondary | ICD-10-CM | POA: Diagnosis not present

## 2023-02-13 DIAGNOSIS — M5440 Lumbago with sciatica, unspecified side: Secondary | ICD-10-CM | POA: Diagnosis not present

## 2023-02-13 DIAGNOSIS — N319 Neuromuscular dysfunction of bladder, unspecified: Secondary | ICD-10-CM | POA: Insufficient documentation

## 2023-02-13 DIAGNOSIS — R269 Unspecified abnormalities of gait and mobility: Secondary | ICD-10-CM | POA: Diagnosis not present

## 2023-02-13 DIAGNOSIS — K592 Neurogenic bowel, not elsewhere classified: Secondary | ICD-10-CM | POA: Insufficient documentation

## 2023-02-13 DIAGNOSIS — G834 Cauda equina syndrome: Secondary | ICD-10-CM | POA: Insufficient documentation

## 2023-02-13 DIAGNOSIS — G8929 Other chronic pain: Secondary | ICD-10-CM | POA: Insufficient documentation

## 2023-02-13 MED ORDER — DOCUSATE SODIUM 100 MG PO CAPS: 100.0000 mg | ORAL_CAPSULE | Freq: Every day | ORAL | 5 refills | Status: DC

## 2023-02-13 MED ORDER — BISACODYL 10 MG RE SUPP: 10.0000 mg | RECTAL | 0 refills | Status: DC | PRN

## 2023-02-13 NOTE — Progress Notes (Signed)
Subjective:    Patient ID: Susan [name], female    DOB: January 29, 1961, 62 y.o.   MRN: [id number]  HPI  HPI  Susan [name] is a 62 y.o. year old female  who  has a past medical history of ADD (attention deficit disorder), Alcohol abuse, Anemia, Anxiety, Aortic atherosclerosis (HCC) (02/20/2018), Arthritis, Asthma, Bipolar disorder (HCC), Centrilobular emphysema (HCC), Cocaine use disorder, moderate, in sustained remission (HCC), Constipation, Degenerative disc disease at L5-S1 level (09/28/2016), Depression, Diabetes mellitus, type 2 (HCC), Diverticulitis (2013), DOE (dyspnea on exertion), Drug use, Family history of adverse reaction to anesthesia, GERD (gastroesophageal reflux disease), H/O suicide attempt, Hepatitis C (06/26/2014), Hip pain, History of echocardiogram, History of MRSA infection (2013), HLD (hyperlipidemia), Hypertension, Hyponatremia, Hypothyroidism, Incisional hernia (11/08/2012), Knee pain, Left carotid bruit, Morbid obesity (HCC), Multinodular thyroid, OSA (obstructive sleep apnea), OSA on CPAP, Osteoporosis, Palpitations, Post-traumatic osteoarthritis of right knee (09/24/2015), Recurrent ventral hernia (11/08/2012), Status post total right knee replacement using cement (05/10/2016), Stroke (HCC) (07/05/2022), Tobacco use disorder, Vitamin B12 deficiency, and Vitamin D deficiency disease.   They are presenting to PM&R clinic as a new patient for pain management evaluation. They were referred by Dr. Marcell Barlow for treatment of low back and radicular pain s/p lumbar laminectomy.   Per his last note 11/22/22: "Susan [name] is improving after lumbar decompression.  Unfortunately, she suffered an epidural hematoma requiring return to the operating room for evacuation.  She has been left with cauda equina syndrome which is slowly improving, but does result in some issues with bowel bladder control.   I recommended that we refer her for outpatient physical therapy, and will refer  her to physical medicine and rehabilitation to help manage her symptoms."  Per Dr. Margaretann Loveless last note 11/14/22: " Medications have included prednisone (temporary relief), Aleve (bruising), Celebrex (less effective), Mobic (moderate relief), gabapentin, tramadol, Cymbalta, and Norco 5/325 daily as needed for breakthrough pain. Physical therapy was not beneficial. She subsequently had physical therapy at [facility] with [name] Muckle."  Source: Mid back / upper lumbar spine Inciting incident:  Patient states symptoms were worse after her second surgery (had hematoma and L3-5 decompression and evac 10/12/22. States since this surgery, she could not walk. C/b DVT.   Description of pain: Burning, nagging sensation, mostly staying in the back but occassionally radiating into the bilateral thighs to the knees  Exacerbating factors:   , bending forward, and activity Remitting factors: pain medication and laying down Red flag symptoms:  ongoing weakness, bilateral numbness/tingling, saddle anesthesia, and loss of bowel or bladder continence since surgery  Medications tried: Topical medications (no effect) : Lidocaine patch and tiger balm Nsaids (mild effect) : Takes aspirin and celebrex - currently out of celebrex Tylenol  (mild effect) : 2 every morning Opiates  (moderate effect) : Norco 5 mg 1 tablet to 1.5 tablets daily; gets it through Dr. Yves Dill at [facility]. Does not last very long.  Gabapentin / Lyrica  (moderate effect) : Takes 600 mg gabapentin BID. Never tried pregabalin. No side effects.   TCAs  (never tried) :  SNRIs  (mild effect) : On cymbalta 50 mg once daily Other  () : Latuda and trazodone PRN for sleep  Other treatments: PT/OT  (excellent effect) : Most recently last Tuesday; has been ongoing 2x weekly with ongoing benefit. Does HEP with cane at home.    Accupuncture/chiropractor/massage  (never tried) :  TENs unit (never tried) :  Injections () : None since surgery; werent working before.  Surgery ( worsened symptoms ) : On 10/10/2022 she underwent right L4-5 laminoforaminotomy. She was doing well, was discharged home, went to the bathroom and was unable to get off the toilet. She was found to have cauda equina syndrome secondary to an epidural hematoma. On 10/12/2022 she underwent L3-5 decompression and epidural hematoma evacuation. Other  () :   Bowel incontinence / constipation; saw GI and was prescribed linzess which worsened things. Waiting on a colonoscopy. Never tried a bowel program.   Urinary, "I know when I have to go but I can't control it." Has not been on any medication and has tried laxatives only to control this. "I have to push to get it all out." Has never seen a urologist.   Goals for pain control: "To walk and drive and be indepenent again."   Goes to [employer] for recreation, has a good friend group. Lives alone, son will assist intermittently. Daughter drives her to appointments.   Prior UDS results:     Component Value Date/Time   LABOPIA POSITIVE (A) 05/10/2016 0616   COCAINSCRNUR NONE DETECTED 05/10/2016 0616   LABBENZ NONE DETECTED 05/10/2016 0616   AMPHETMU NONE DETECTED 05/10/2016 0616   THCU NONE DETECTED 05/10/2016 0616   LABBARB NONE DETECTED 05/10/2016 0616     Pain Inventory Average Pain 5 Pain Right Now 6 My pain is constant, dull, tingling, and aching  In the last 24 hours, has pain interfered with the following? General activity 0 Relation with others 0 Enjoyment of life 2 What TIME of day is your pain at its worst? evening Sleep (in general) Fair  Pain is worse with: walking, standing, and some activites Pain improves with: rest and medication Relief from Meds: 5  use a walker how many minutes can you walk? 5 minutes ability to climb steps?  no do you drive?  no  I need assistance with the following:  meal prep, household duties, and shopping Do you have any goals in this area?  yes  bladder control problems bowel  control problems weakness numbness tingling trouble walking depression anxiety  Any changes since last visit?  no  Any changes since last visit?  no    Family History  Problem Relation Age of Onset   Arthritis Mother    Asthma Mother    Mental illness Mother    Thyroid disease Mother    COPD Mother    Heart disease Mother    Congestive Heart Failure Mother    Alcohol abuse Mother    Eating disorder Mother    Bipolar disorder Mother    Alcohol abuse Father    Heart attack Father    Alcohol abuse Sister    Drug abuse Sister    Mental illness Sister    Mental illness Sister    Fibromyalgia Sister    Obesity Sister    Pneumonia Sister    Depression Sister    Mental illness Sister    Alcohol abuse Sister    Drug abuse Sister    Arthritis Brother    Mental illness Brother    Cancer Brother        non-hodkins lymphoma   Drug abuse Daughter    Drug abuse Son    Alcohol abuse Son    Drug abuse Son    Alcohol abuse Son    Diabetes Neg Hx    Stroke Neg Hx    Breast cancer Neg Hx    Thyroid cancer Neg Hx  Social History   Socioeconomic History   Marital status: Widowed    Spouse name: Not on file   Number of children: 3   Years of education: Not on file   Highest education level: Some college, no degree  Occupational History   Occupation: disabled  Tobacco Use   Smoking status: Every Day    Current packs/day: 0.50    Average packs/day: 0.5 packs/day for 41.0 years (20.5 ttl pk-yrs)    Types: Cigarettes   Smokeless tobacco: Never  Vaping Use   Vaping status: Every Day   Substances: Nicotine  Substance and Sexual Activity   Alcohol use: No    Alcohol/week: 0.0 standard drinks of 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   Drug use: No    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   Sexual activity: Not Currently  Other Topics Concern   Not on file  Social History Narrative   Not on file   Social Determinants of Health   Financial  Resource Strain: Low Risk  (06/02/2021)   Overall Financial Resource Strain (CARDIA)    Difficulty of Paying Living Expenses: Not hard at all  Food Insecurity: No Food Insecurity (10/24/2022)   Hunger Vital Sign    Worried About Running Out of Food in the Last Year: Never true    Ran Out of Food in the Last Year: Never true  Transportation Needs: No Transportation Needs (10/24/2022)   PRAPARE - Administrator, Civil Service (Medical): No    Lack of Transportation (Non-Medical): No  Physical Activity: Sufficiently Active (06/02/2021)   Exercise Vital Sign    Days of Exercise per Week: 5 days    Minutes of Exercise per Session: 30 min  Stress: No Stress Concern Present (06/02/2021)   [employer] of Occupational Health - Occupational Stress Questionnaire    Feeling of Stress : Not at all  Social Connections: Moderately Isolated (06/02/2021)   Social Connection and Isolation Panel [NHANES]    Frequency of Communication with Friends and Family: More than three times a week    Frequency of Social Gatherings with Friends and Family: Three times a week    Attends Religious Services: Never    Active Member of Clubs or Organizations: Yes    Attends Banker Meetings: More than 4 times per year    Marital Status: Widowed   Past Surgical History:  Procedure Laterality Date   BILATERAL SALPINGOOPHORECTOMY     due to abnormal mass   BREAST BIOPSY Left    neg   BREAST SURGERY Left 20 yrs ago   CESAREAN SECTION     COLONOSCOPY     COLONOSCOPY WITH PROPOFOL N/A 10/16/2017   Procedure: COLONOSCOPY WITH PROPOFOL;  Surgeon: Wyline Mood, MD;  Location: [facility] ENDOSCOPY;  Service: Gastroenterology;  Laterality: N/A;   COLONOSCOPY WITH PROPOFOL N/A 02/16/2021   Procedure: COLONOSCOPY WITH PROPOFOL;  Surgeon: Wyline Mood, MD;  Location: [facility] ENDOSCOPY;  Service: Gastroenterology;  Laterality: N/A;   FORAMINOTOMY 1 LEVEL Right 10/10/2022   Procedure: RIGHT L4-5 LAMINOFORAMINOTOMY;   Surgeon: Venetia Night, MD;  Location: [facility] ORS;  Service: Neurosurgery;  Laterality: Right;   HERNIA REPAIR  06/2011, July 2014   Ventral wall repair with Physiomesh   HERNIA REPAIR     2nd.vental wall repair   JOINT REPLACEMENT Right    knee   LUMBAR LAMINECTOMY/ DECOMPRESSION WITH MET-RX N/A 10/12/2022   Procedure: LUMBAR LAMINECTOMY/ DECOMPRESSION WITH MET-RX;  Surgeon: Myer Haff,  Annia Friendly, MD;  Location: [facility] ORS;  Service: Neurosurgery;  Laterality: N/A;   TONSILLECTOMY     TOTAL HIP ARTHROPLASTY Left 07/23/2019   Procedure: TOTAL HIP ARTHROPLASTY;  Surgeon: Christena Flake, MD;  Location: [facility] ORS;  Service: Orthopedics;  Laterality: Left;   TOTAL KNEE ARTHROPLASTY Right 05/10/2016   Procedure: TOTAL KNEE ARTHROPLASTY;  Surgeon: Christena Flake, MD;  Location: [facility] ORS;  Service: Orthopedics;  Laterality: Right;   TUBAL LIGATION     Past Medical History:  Diagnosis Date   ADD (attention deficit disorder)    Alcohol abuse    Anemia    Anxiety    Aortic atherosclerosis (HCC) 02/20/2018   Chest CT Sept 2019   Arthritis    rheumatoid arthritis   Asthma    Bipolar disorder (HCC)    Centrilobular emphysema (HCC)    Cocaine use disorder, moderate, in sustained remission (HCC)    Constipation    Degenerative disc disease at L5-S1 level 09/28/2016   See ortho note May 2018   Depression    bipolar, hx of suicide attempt   Diabetes mellitus, type 2 (HCC)    Diverticulitis 2013   DOE (dyspnea on exertion)    Drug use    Family history of adverse reaction to anesthesia    mom-delayed emergence   GERD (gastroesophageal reflux disease)    H/O suicide attempt    slit wrists   Hepatitis C 06/26/2014   treated with Harvoni   Hip pain    History of echocardiogram    a. 04/2019 Echo: EF >65%, nl RV fxn.   History of MRSA infection 2013   HLD (hyperlipidemia)    Hypertension    a. 06/2021 Renal Duplex: ? bilat RAS; b. 06/2021 CTA Abd: No signif RAS.   Hyponatremia    Hypothyroidism     Incisional hernia 11/08/2012   Knee pain    Left carotid bruit    Morbid obesity (HCC)    Multinodular thyroid    OSA (obstructive sleep apnea)    OSA on CPAP    Osteoporosis    Palpitations    Post-traumatic osteoarthritis of right knee 09/24/2015   Recurrent ventral hernia 11/08/2012   Status post total right knee replacement using cement 05/10/2016   Stroke (HCC) 07/05/2022   right arm,torso and right leg numbness   Tobacco use disorder    Vitamin B12 deficiency    Vitamin D deficiency disease    BP 112/64   Pulse 67   Ht 5\' 1"  (1.549 m)   Wt 223 lb (101.2 kg)   SpO2 97%   BMI 42.14 kg/m   Opioid Risk Score:   Fall Risk Score:  `1  Depression screen [facility] 2/9     02/13/2023    9:12 AM 12/28/2022   10:39 AM 12/02/2022   11:00 AM 11/29/2022    9:15 AM 11/21/2022    2:33 PM 09/28/2022    9:21 AM 08/31/2022    1:22 PM  Depression screen PHQ 2/9  Decreased Interest 1 1 1  1 1    Down, Depressed, Hopeless 1 1 1  1  0   PHQ - 2 Score 2 2 2  2 1    Altered sleeping 0 0 1  1 0   Tired, decreased energy 1 1 2  2 1    Change in appetite 0 0 1  0 0   Feeling bad or failure about yourself  0 0 1  0 0   Trouble concentrating 1  1 1  1 1    Moving slowly or fidgety/restless 0 0 0  0 0   Suicidal thoughts 0 0 0  0 0   PHQ-9 Score 4 4 8  6 3    Difficult doing work/chores   Somewhat difficult  Somewhat difficult Somewhat difficult      Information is confidential and restricted. Go to Review Flowsheets to unlock data.    Review of Systems  Gastrointestinal:  Positive for constipation and diarrhea.  Genitourinary:  Positive for urgency.  Neurological:  Positive for weakness and numbness.       Tingling  Psychiatric/Behavioral:         Depression, anxiety  All other systems reviewed and are negative.      Objective:   Physical Exam    PE: Constitution: Appropriate appearance for age. No apparent distress  +Obese Resp: No respiratory distress. No accessory muscle usage.  on RA and CTAB Cardio: Well perfused appearance. No peripheral edema. Abdomen: Nondistended. Nontender.  +BS, hypoactive.  Psych: Appropriate mood and affect. Neuro: AAOx4. No apparent cognitive deficits   Neurologic Exam:   DTRs: Reflexes were 2+ in bilateral achilles, patella, biceps, BR and triceps. Babinsky: flexor responses b/l.   Hoffmans: negative b/l Sensory exam: revealed normal sensation in all dermatomal regions in left lower extremity and with reduced sensation to light touch in right lower extremity throuhgout, and absent sensation in L2-3 and S1 dermatomes Motor exam: strength 5/5 throughout bilateral lower extremities and with exception of right knee extension 4/5 and left EHL 4/5 Coordination: Fine motor coordination was normal in bilateral lower extremities.  Gait: +trendelenberg with right hip drop, right knee mild scissoring and foor eversion. +RW, clears feet, stable pattern.   Back Exam:   Inspection: Pelvis was unevent left higher than right  Lumbar lordotic curvature was reduced; significant dextroscoliosus and thorkyphosis .   Palpation: Palpatory exam revealed ttp at the left lumbar paraspinals only . There was  no evidence of spasm.   ROM revealed restricted ROM in left knee extension . Special/provocative testing:    SLR: -   Slump test: -    Facet loading: +(very non-specific)   TTP at paraspinals: +(sensitive for facet pain...if no ttp then likely not facet pain)  Information in () parenthesis is normals/details of specific exam.      Assessment & Plan:   ARLIENE TOMEK is a 62 y.o. year old female  who  has a past medical history of ADD (attention deficit disorder), Alcohol abuse, Anemia, Anxiety, Aortic atherosclerosis (HCC) (02/20/2018), Arthritis, Asthma, Bipolar disorder (HCC), Centrilobular emphysema (HCC), Cocaine use disorder, moderate, in sustained remission (HCC), Constipation, Degenerative disc disease at L5-S1 level (09/28/2016), Depression,  Diabetes mellitus, type 2 (HCC), Diverticulitis (2013), DOE (dyspnea on exertion), Drug use, Family history of adverse reaction to anesthesia, GERD (gastroesophageal reflux disease), H/O suicide attempt, Hepatitis C (06/26/2014), Hip pain, History of echocardiogram, History of MRSA infection (2013), HLD (hyperlipidemia), Hypertension, Hyponatremia, Hypothyroidism, Incisional hernia (11/08/2012), Knee pain, Left carotid bruit, Morbid obesity (HCC), Multinodular thyroid, OSA (obstructive sleep apnea), OSA on CPAP, Osteoporosis, Palpitations, Post-traumatic osteoarthritis of right knee (09/24/2015), Recurrent ventral hernia (11/08/2012), Status post total right knee replacement using cement (05/10/2016), Stroke (HCC) (07/05/2022), Tobacco use disorder, Vitamin B12 deficiency, and Vitamin D deficiency disease.   They are presenting to PM&R clinic as a new patient for treatment of pain and debility due to cauda equina syndrome s/p lumbar laminectomy and hematoma eval L3-5 10/2022 with Dr. Marcell Barlow .  Marland Kitchen  Cauda equina syndrome (HCC) Currently in physical therapy twice weekly, continuing to benefit, she should continue this and maintain mobility and community activities is much as possible.  Does continue to be complicated by neuropathic low back pain with occasional extension into the bilateral lower extremities, along with weakness, (lower motor neuron) neurogenic bowel, neurogenic bladder.  Follow up in 2-3 months  Chronic low back pain with sciatica, sciatica laterality unspecified, unspecified back pain laterality Follows with Dr. Yves Dill at [facility] for chronic pain management, and wishes to continue this therapeutic relationship.  Continue management of Norco 5 mg 1-1.5 tabs daily with his office.  I recommend considering either an increase in medication frequency to help control your symptoms, or transition to something like a Butrans patch.  I am happy to coordinate with him in your care.  We discussed  transitioning from gabapentin to Lyrica, which patient is agreeable to.  I will have to calculated taper for this medication, so please wait 1 to 2 days before going to your pharmacy.  Instructions will be on the bottle. -- Prescribed Lyrica 75 mg capsules with 5 day cross taper with gabapentin up to 150 mg BID; filled for 3 months  Neurogenic bowel I am ordering an x-ray of your belly to make sure that you are not severely constipated, and will call you regarding the results.  Start taking Colace 100 mg tablets with breakfast in the morning.  30 minutes to 1 hour after breakfast, sit down on the toilet and bear down and attempt to have a bowel movement.  If no results after 10 to 15 minutes, taken as needed suppository or perform digital stimulation with your gloved finger in the rectum making a clockwise motion for approximately 15 seconds.  Goal is to have a bowel movement every day at approximately the same time, and reduce incontinent episodes outside of the home.  If you have any questions or concerns about this, please let me know.  Neurogenic bladder Start keeping a voiding diary, and reduce your fluid intake.  Try to gradually increase stretches of time for voiding to 3 to 4 hours.  I have sent a referral to urology for official evaluation and medication management.  Abnormality of gait and mobility  She is concerned regarding return to driving, although has been cleared by Dr. Marcell Barlow.  I do think she has enough sensation and motor control of her feet that this may be reasonable.  I Provided information today on OT resources for driving evaluations. Provided written referral for OT driving eval.

## 2023-02-13 NOTE — Patient Instructions (Signed)
  Cauda equina syndrome (HCC) Currently in physical therapy twice weekly, continuing to benefit, she should continue this and maintain mobility and community activities is much as possible.  Does continue to be complicated by neuropathic low back pain with occasional extension into the bilateral lower extremities, along with weakness, neurogenic bowel, neurogenic bladder.  Follow up in 2-3 months  Chronic low back pain with sciatica, sciatica laterality unspecified, unspecified back pain laterality Follows with Dr. Yves Dill at [facility] for chronic pain management, and wishes to continue this therapeutic relationship.  Continue management of Norco 5 mg 1-1.5 tabs daily with his office.  I recommend considering either an increase in urtication frequency to help control your symptoms, or transition to something like a Butrans patch.  I am happy to coordinate with him in your care.  We discussed transitioning from gabapentin to Lyrica, which patient is agreeable to.  I will have to calculated taper for this medication, so please wait 1 to 2 days before going to your pharmacy.  Instructions will be on the bottle.  Neurogenic bowel I am ordering an x-ray of your belly to make sure that you are not severely constipated, and will call you regarding the results.  Start taking Colace 100 mg tablets with breakfast in the morning.  30 minutes to 1 hour after breakfast, sit down on the toilet and bear down and attempt to have a bowel movement.  If no results after 10 to 15 minutes, taken as needed suppository or perform digital stimulation with your gloved finger in the rectum making a clockwise motion for approximately 15 seconds.  Goal is to have a bowel movement every day at approximately the same time, and reduce incontinent episodes outside of the home.  If you have any questions or concerns about this, please let me know.  Neurogenic bladder Start keeping a voiding diary, and reduce your fluid intake.  Try to  gradually increase stretches of time for voiding to 3 to 4 hours.  I have sent a referral to urology for official evaluation and medication management.  Abnormality of gait and mobility  She is concerned regarding return to driving, although has been cleared by Dr. Marcell Barlow.  I do think she has enough sensation and motor control of her feet that this may be reasonable.  I Provided information today on DMV resources for driving evaluations. Provided referral for OT driving eval.

## 2023-02-14 ENCOUNTER — Other Ambulatory Visit: Payer: Self-pay | Admitting: Psychiatry

## 2023-02-14 ENCOUNTER — Other Ambulatory Visit: Payer: Self-pay | Admitting: Family Medicine

## 2023-02-14 DIAGNOSIS — F3131 Bipolar disorder, current episode depressed, mild: Secondary | ICD-10-CM

## 2023-02-14 DIAGNOSIS — F4310 Post-traumatic stress disorder, unspecified: Secondary | ICD-10-CM

## 2023-02-15 ENCOUNTER — Ambulatory Visit: Payer: 59

## 2023-02-15 DIAGNOSIS — M6281 Muscle weakness (generalized): Secondary | ICD-10-CM | POA: Diagnosis not present

## 2023-02-15 DIAGNOSIS — R262 Difficulty in walking, not elsewhere classified: Secondary | ICD-10-CM

## 2023-02-15 NOTE — Therapy (Addendum)
OUTPATIENT PHYSICAL THERAPY NEURO TREATMENT  Patient Name: Susan [name] MRN: [id number] DOB:1960-11-30, 62 y.o., female Today's Date: 02/15/2023   PCP: [name] MD REFERRING PROVIDER: Venetia Night MD  END OF SESSION:  PT End of Session - 02/15/23 1118     Visit Number 12    Number of Visits 25    Date for PT Re-Evaluation 03/07/23    PT Start Time 1115    PT Stop Time 1158    PT Time Calculation (min) 43 min    Equipment Utilized During Treatment Gait belt    Activity Tolerance Patient tolerated treatment well    Behavior During Therapy WFL for tasks assessed/performed             Past Medical History:  Diagnosis Date   ADD (attention deficit disorder)    Alcohol abuse    Anemia    Anxiety    Aortic atherosclerosis (HCC) 02/20/2018   Chest CT Sept 2019   Arthritis    rheumatoid arthritis   Asthma    Bipolar disorder (HCC)    Centrilobular emphysema (HCC)    Cocaine use disorder, moderate, in sustained remission (HCC)    Constipation    Degenerative disc disease at L5-S1 level 09/28/2016   See ortho note May 2018   Depression    bipolar, hx of suicide attempt   Diabetes mellitus, type 2 (HCC)    Diverticulitis 2013   DOE (dyspnea on exertion)    Drug use    Family history of adverse reaction to anesthesia    mom-delayed emergence   GERD (gastroesophageal reflux disease)    H/O suicide attempt    slit wrists   Hepatitis C 06/26/2014   treated with Harvoni   Hip pain    History of echocardiogram    a. 04/2019 Echo: EF >65%, nl RV fxn.   History of MRSA infection 2013   HLD (hyperlipidemia)    Hypertension    a. 06/2021 Renal Duplex: ? bilat RAS; b. 06/2021 CTA Abd: No signif RAS.   Hyponatremia    Hypothyroidism    Incisional hernia 11/08/2012   Knee pain    Left carotid bruit    Morbid obesity (HCC)    Multinodular thyroid    OSA (obstructive sleep apnea)    OSA on CPAP    Osteoporosis    Palpitations    Post-traumatic  osteoarthritis of right knee 09/24/2015   Recurrent ventral hernia 11/08/2012   Status post total right knee replacement using cement 05/10/2016   Stroke (HCC) 07/05/2022   right arm,torso and right leg numbness   Tobacco use disorder    Vitamin B12 deficiency    Vitamin D deficiency disease    Past Surgical History:  Procedure Laterality Date   BILATERAL SALPINGOOPHORECTOMY     due to abnormal mass   BREAST BIOPSY Left    neg   BREAST SURGERY Left 20 yrs ago   CESAREAN SECTION     COLONOSCOPY     COLONOSCOPY WITH PROPOFOL N/A 10/16/2017   Procedure: COLONOSCOPY WITH PROPOFOL;  Surgeon: Wyline Mood, MD;  Location: [facility] ENDOSCOPY;  Service: Gastroenterology;  Laterality: N/A;   COLONOSCOPY WITH PROPOFOL N/A 02/16/2021   Procedure: COLONOSCOPY WITH PROPOFOL;  Surgeon: Wyline Mood, MD;  Location: [facility] ENDOSCOPY;  Service: Gastroenterology;  Laterality: N/A;   FORAMINOTOMY 1 LEVEL Right 10/10/2022   Procedure: RIGHT L4-5 LAMINOFORAMINOTOMY;  Surgeon: Venetia Night, MD;  Location: [facility] ORS;  Service: Neurosurgery;  Laterality: Right;   HERNIA  REPAIR  06/2011, July 2014   Ventral wall repair with Physiomesh   HERNIA REPAIR     2nd.vental wall repair   JOINT REPLACEMENT Right    knee   LUMBAR LAMINECTOMY/ DECOMPRESSION WITH MET-RX N/A 10/12/2022   Procedure: LUMBAR LAMINECTOMY/ DECOMPRESSION WITH MET-RX;  Surgeon: Venetia Night, MD;  Location: [facility] ORS;  Service: Neurosurgery;  Laterality: N/A;   TONSILLECTOMY     TOTAL HIP ARTHROPLASTY Left 07/23/2019   Procedure: TOTAL HIP ARTHROPLASTY;  Surgeon: Christena Flake, MD;  Location: [facility] ORS;  Service: Orthopedics;  Laterality: Left;   TOTAL KNEE ARTHROPLASTY Right 05/10/2016   Procedure: TOTAL KNEE ARTHROPLASTY;  Surgeon: Christena Flake, MD;  Location: [facility] ORS;  Service: Orthopedics;  Laterality: Right;   TUBAL LIGATION     Patient Active Problem List   Diagnosis Date Noted   Neurogenic bowel 02/13/2023   Neurogenic bladder 02/13/2023    Abnormality of gait and mobility 02/13/2023   Blister of foot without infection, right, initial encounter 01/05/2023   Bipolar 1 disorder, depressed, moderate (HCC) 11/29/2022   Acute respiratory failure with hypoxia (HCC) 10/13/2022   AKI (acute kidney injury) (HCC) 10/13/2022   Anemia 10/13/2022   Cauda equina syndrome (HCC) 10/12/2022   Epidural hematoma (HCC) 10/12/2022   Mass in epidural space 10/12/2022   Synovial cyst of lumbar facet joint 10/10/2022   Lumbar radiculopathy 10/10/2022   S/P laminectomy 10/10/2022   Preop examination 09/28/2022   Epigastric pain 07/22/2022   Enlarged thyroid 07/22/2022   Chronic diarrhea 07/22/2022   CVA (cerebrovascular accident) (HCC) 07/05/2022   Skin lesion 06/28/2022   BMI 38.0-38.9,adult 07/14/2021   Type 2 diabetes mellitus without complication, without long-term current use of insulin (HCC) 06/16/2021   H/O thyroid nodule 05/31/2021   History of hepatitis C- treated 2014- 2015 with [facility] gastroenterology and followed.  05/19/2021   Left carotid bruit 04/28/2021   Anxiety 05/28/2020   Bipolar affective disorder in remission (HCC) 05/28/2020   Bipolar disorder, in full remission, most recent episode mixed (HCC) 03/06/2020   Need for immunization against influenza 01/27/2020   Routine physical examination 01/27/2020   Morbid obesity (HCC) 01/27/2020   Osteoporosis 11/06/2019   Bipolar 1 disorder, mixed, mild (HCC) 10/29/2019   PTSD (post-traumatic stress disorder) 09/27/2019   Bereavement 09/27/2019   Cocaine use disorder, moderate, in sustained remission (HCC) 09/27/2019   Status post total hip replacement, left 07/23/2019   Primary osteoarthritis of left hip 06/14/2019   Depression 10/23/2018   Allergic rhinitis 08/23/2018   Aortic atherosclerosis (HCC) 02/20/2018   Centrilobular emphysema (HCC) 02/20/2018   Hyperlipidemia 03/28/2017   Degenerative disc disease at L5-S1 level 09/28/2016   Tobacco use disorder 04/21/2016   GERD  (gastroesophageal reflux disease) 12/17/2015   Chronic back pain 10/09/2015   Post-traumatic osteoarthritis of right knee 09/24/2015   Alcohol use disorder, severe, in sustained remission (HCC) 04/13/2015   Vitamin D deficiency 04/09/2015   Vitamin B12 deficiency 04/09/2015   OSA on CPAP 04/09/2015   Subclinical hyperthyroidism 06/26/2014   Goiter colloid, toxic, nodular 06/26/2014   Hypertension 06/26/2014   Tobacco use disorder, continuous 06/26/2014   Bipolar I disorder (HCC) 06/26/2014   Lumbar radiculitis 10/03/2013   Diverticulosis 08/24/2013    ONSET DATE: 10/12/22  REFERRING DIAG: U98.119 (ICD-10-CM) - Status post lumbar spine surgery for decompression of spinal cord G83.4 (ICD-10-CM) - Cauda equina syndrome (HCC)  THERAPY DIAG:  Muscle weakness (generalized)  Difficulty in walking, not elsewhere classified  Rationale for Evaluation and  Treatment: Rehabilitation  SUBJECTIVE:                                                                                                                                                                                             SUBJECTIVE STATEMENT:   Pt reports no pain at today's session. She states she was cleared to drive and drove herself here today.   Pt accompanied by: self  PERTINENT HISTORY:  Susan [name] is a 62 y/o F with PMH: HTN, DM2, HLD, ETOH use, THA, PTSD, cocaine, bipolar, and L thalamic infarct (06/2022), s/p lumbar laminoforaminotomy (10/10/22). Presenting post L3-5 decompression with evacuation of epidural hematoma on 10/12/22 for Cauda Equina Syndrome. Pt has residual RLE deficits after her lumbar surgeries leading to ambulating with RW. She was independent prior to her surgeries now reporting balance and gait difficulties. Has had some Worsening RUE N/T since her CVA in February after her lumbar surgeries. Reports N/T 6-7/10 on severity. Mentions some falls when not using her RW. Feels like she always has to have a hand on  something to steady herself.  Pt has residual RLE deficits after her lumbar surgeries leading to ambulating with RW. She was independent prior to her surgeries now reporting balance and gait difficulties. Has had some Worsening RUE N/T since her CVA in February after her lumbar surgeries. Reports N/T 6-7/10 on severity. Mentions some falls when not using her RW. Feels like she always has to have a hand on something to steady herself. Has had some falls anterior standing too close in the RW, a fall walking up the stairs with the RW. She is able to get herself up. Pt describing some unsafe scenarios with poor RW use with transfers and asc/desc curbs.   PAIN:  Are you having pain? No  PRECAUTIONS: Back  RED FLAGS: Bowel or bladder incontinence: Yes: MD aware since surgery.     WEIGHT BEARING RESTRICTIONS: No  FALLS: Has patient fallen in last 6 months? Yes. Number of falls 3  LIVING ENVIRONMENT: Lives with: lives with their son   PLOF: Independent  PATIENT GOALS: Improve her walking and   OBJECTIVE:   DIAGNOSTIC FINDINGS: CLINICAL DATA:  Elective surgery   EXAM: LUMBAR SPINE - 2-3 VIEW   COMPARISON:  10/10/2022   FINDINGS: Intraoperative fluoroscopy is utilized for surgical control purposes. Fluoroscopy time is recorded at 6 seconds. Dose 5.33 mGy. Two spot fluoroscopic images are obtained. Initial image demonstrates a localization marker posterior to the L4 vertebra. Second image demonstrates a localization marker posterior to the L3-4 interspace level.   IMPRESSION: Intraoperative fluoroscopy utilized for surgical control purposes for  lumbar spine localization.     Electronically Signed   By: Burman Nieves M.D.   On: 10/12/2022 20:24  COGNITION: Overall cognitive status: Within functional limits for tasks assessed   SENSATION: Light touch: Impaired  RLE: impaired L2, L4-L5  COORDINATION: Heel to shin normal bilaterally   PROPRIOCEPTION: Normal  bilat   POSTURE:  Heavy BUE support on RW with standing tasks with bilateral upper trap use   LUMBAR AROM: Flexion: 75% limited  Extension: 50% limited  Rotation R/L: 50% limited  Lateral flexion R/L: 50% limited  LOWER EXTREMITY AROM:  L ankle inversion: 20, eversion: 35  R ankle inversion: 15 , eversion: 25    LOWER EXTREMITY MMT:    MMT Right Eval Left Eval  Hip flexion 4- 4  Hip extension    Hip abduction    Hip adduction    Hip internal rotation    Hip external rotation    Knee flexion 4- 4+  Knee extension 4 4+  Ankle dorsiflexion 4- 4+  Ankle plantarflexion    Ankle inversion 3 4+  Ankle eversion 3 4-  (Blank rows = not tested)  TRANSFERS: Assistive device utilized: Environmental consultant - 2 wheeled  Sit to stand: Modified independence Stand to sit: Modified independence  *poor hand placement keeping BUE's on RW for STS and stand to sit despite VC's and education for safe hand placement.   GAIT: Gait pattern: step through pattern, decreased stance time- Right, decreased ankle dorsiflexion- Right, and poor foot clearance- Right. Notable R ankle inversion in stance phase due to evertor weakness Distance walked: 10 meters Assistive device utilized: Walker - 2 wheeled Level of assistance: Modified independence Comments: Heavy BUE support on RW. PT adjusting RW height to reduced B shoulder shrug.   FUNCTIONAL TESTS:  5 times sit to stand: 29.37 seconds Timed up and go (TUG): 26.89 seconds 10 meter walk test: 0.48 m/s with RW  PATIENT SURVEYS:  FOTO 37 with target score of 43  TODAY'S TREATMENT: DATE: 02/15/23  There-ex:  Nustep x5 minutes to increase LE strengthening and lumbar mobility lvl 4.0 seat 7  Seated heel raises w/ 6# DB on bilat distal thighs 2 x10/each side  Neuro re-ed:   STS to ambulation CGA with hurry cane in RUE 2 x25 down and back. 1 x25' w/ dual task counting by 2's out loud  STS to ambulation CGA w/ hurry cane in RUE weaving in and out of  three cones to emphasize turning technique 3 x15' down and back  Obstacle course with CGA w/ hurry cane in RUE, stepping over 6" obstacle x2, stepping onto airex pad. 2 x15' down and back; stepping over 4" obstacle followed by onto airex pad x15' down and back    PATIENT EDUCATION: Education details: HEP, safe use of AD, POC Person educated: Patient Education method: Explanation, Demonstration, Verbal cues, and Handouts Education comprehension: verbalized understanding and needs further education  HOME EXERCISE PROGRAM: Access Code: [email or web address] URL: [email or web address] Date: 12/13/2022 Prepared by: Ronnie Derby  Exercises - Supine Bridge  - 1 x daily - 7 x weekly - 3 sets - 6 reps - Seated Scapular Retraction  - 1 x daily - 7 x weekly - 3 sets - 15 reps - Seated Ankle Circles  - 1 x daily - 7 x weekly - 3 sets - 15 reps  GOALS: Goals reviewed with patient? No  SHORT TERM GOALS: Target date: 12/24/22  Pt will be independent with HEP to improve RLE strength for transfers and  gait Baseline: 12/13/22: Provided initial HEP 02/02/23: HEP compliant Goal status: MET  2.  Pt will demonstrate safe use of hand placement with transfers with LRAD to demonstrate reduced falls risk. Baseline: 12/13/22: BUE use on AD with STS and stand to sit transfers 02/02/23: Correct hand placement noted  Goal status: MET  LONG TERM GOALS: Target date: 03/07/23  Pt will improve FOTO to target score to demonstrate clinically significant improvement in functional mobility.  Baseline: 12/13/22: 37/43 02/02/23: 43/43 Goal status: MET  2.  Pt will complete TUG in 12 seconds or less with LRAD to demonstrate reduced falls risk with transfers and short distance gait Baseline: 12/13/22: 26.89 seconds w/ RW, 01/24/23: 44.20 seconds w/ 3 pronged hurry cane 02/02/23: 31.05sec with hurry cane Goal status: ONGOING  3.  Pt will improve 5xSTS to 11.4 seconds or less without UE support to demonstrate improved LE  strength to match age based norms.  Baseline: : 12/13/22: 29.37 seconds 02/02/23: 20.20 seconds Goal status: ONGOING  4.  Pt will improve FGA by at least 4 points to demonstrate clinically significant improvement in balance with community activities.  Baseline: 12/13/22: deferred to next session; 12/29/22: 13/30; 02/02/23:14/30 Goal status: ONGOING  5.  Pt will improve gait speed to at least 1.0 m/s with LRAD to demonstrate safe ability to complete short community distances. Baseline: 12/13/22: 0.48 m/s with RW;  02/02/23: 0.32 m/s w/ hurry cane Goal status: ONGOING  6. Pt will increase 6 MWT by > 165' to display improvements in functional endurance with community ambulation.   Baseline: 01/12/23: 429 ft w/ 7/10 NPS 02/02/23: Defer to next session   Goal status: ONGOING   ASSESSMENT:  CLINICAL IMPRESSION: Session focused on progressing bilat LE strengthening and gait training w/ hurry cane. Pt notes improvements with gait training noted w/ improved turning ability during ambulation. Pt continues to note deficits in bilat LE strength noted w/ increased fatigue following walking trials and difficulty w/ obstacle negotiation during obstacle course activity. Pt self reports ease of functional mobility in the home, however she would continue to benefit from skilled PT interventions to address remaining gait and strength deficits along w/ reducing fall risk.   OBJECTIVE IMPAIRMENTS: Abnormal gait, decreased balance, decreased mobility, difficulty walking, decreased ROM, decreased strength, impaired sensation, and postural dysfunction.   ACTIVITY LIMITATIONS: carrying, lifting, bending, standing, stairs, transfers, reach over head, and locomotion level  PARTICIPATION LIMITATIONS: cleaning, laundry, driving, shopping, and community activity  PERSONAL FACTORS: Age, Past/current experiences, and Time since onset of injury/illness/exacerbation are also affecting patient's functional outcome.   REHAB POTENTIAL:  Good  CLINICAL DECISION MAKING: Evolving/moderate complexity  EVALUATION COMPLEXITY: Moderate  PLAN:  PT FREQUENCY: 2x/week  PT DURATION: 12 weeks  PLANNED INTERVENTIONS: Therapeutic exercises, Therapeutic activity, Neuromuscular re-education, Balance training, Gait training, Patient/Family education, Self Care, Stair training, DME instructions, Manual therapy, and Re-evaluation  PLAN FOR NEXT SESSION: , Ambulation w/ cane, obstacle clearance techniques.     Lovie Macadamia, SPT  Delphia Grates. Fairly IV, PT, DPT Physical Therapist-   [facility]  02/15/23, 12:33 PM

## 2023-02-17 MED ORDER — PREGABALIN 75 MG PO CAPS: ORAL_CAPSULE | ORAL | 0 refills | Status: DC

## 2023-02-17 MED ORDER — PREGABALIN 75 MG PO CAPS: 150.0000 mg | ORAL_CAPSULE | Freq: Two times a day (BID) | ORAL | 1 refills | Status: DC

## 2023-02-20 DIAGNOSIS — M5126 Other intervertebral disc displacement, lumbar region: Secondary | ICD-10-CM | POA: Diagnosis not present

## 2023-02-20 DIAGNOSIS — M5030 Other cervical disc degeneration, unspecified cervical region: Secondary | ICD-10-CM | POA: Diagnosis not present

## 2023-02-20 DIAGNOSIS — M5412 Radiculopathy, cervical region: Secondary | ICD-10-CM | POA: Diagnosis not present

## 2023-02-20 DIAGNOSIS — M51362 Other intervertebral disc degeneration, lumbar region with discogenic back pain and lower extremity pain: Secondary | ICD-10-CM | POA: Diagnosis not present

## 2023-02-20 DIAGNOSIS — M5416 Radiculopathy, lumbar region: Secondary | ICD-10-CM | POA: Diagnosis not present

## 2023-02-21 ENCOUNTER — Ambulatory Visit: Payer: 59

## 2023-02-21 DIAGNOSIS — M6281 Muscle weakness (generalized): Secondary | ICD-10-CM

## 2023-02-21 DIAGNOSIS — R262 Difficulty in walking, not elsewhere classified: Secondary | ICD-10-CM

## 2023-02-21 NOTE — Therapy (Cosign Needed Addendum)
OUTPATIENT PHYSICAL THERAPY NEURO TREATMENT  Patient Name: Susan [name] MRN: [id number] DOB:November 28, 1960, 62 y.o., female Today's Date: 02/22/2023   PCP: [name] MD REFERRING PROVIDER: Venetia Night MD  END OF SESSION:  PT End of Session - 02/21/23 1348     Visit Number 13    Number of Visits 25    Date for PT Re-Evaluation 03/07/23    PT Start Time 1347    PT Stop Time 1428    PT Time Calculation (min) 41 min    Equipment Utilized During Treatment Gait belt    Activity Tolerance Patient tolerated treatment well    Behavior During Therapy WFL for tasks assessed/performed             Past Medical History:  Diagnosis Date   ADD (attention deficit disorder)    Alcohol abuse    Anemia    Anxiety    Aortic atherosclerosis (HCC) 02/20/2018   Chest CT Sept 2019   Arthritis    rheumatoid arthritis   Asthma    Bipolar disorder (HCC)    Centrilobular emphysema (HCC)    Cocaine use disorder, moderate, in sustained remission (HCC)    Constipation    Degenerative disc disease at L5-S1 level 09/28/2016   See ortho note May 2018   Depression    bipolar, hx of suicide attempt   Diabetes mellitus, type 2 (HCC)    Diverticulitis 2013   DOE (dyspnea on exertion)    Drug use    Family history of adverse reaction to anesthesia    mom-delayed emergence   GERD (gastroesophageal reflux disease)    H/O suicide attempt    slit wrists   Hepatitis C 06/26/2014   treated with Harvoni   Hip pain    History of echocardiogram    a. 04/2019 Echo: EF >65%, nl RV fxn.   History of MRSA infection 2013   HLD (hyperlipidemia)    Hypertension    a. 06/2021 Renal Duplex: ? bilat RAS; b. 06/2021 CTA Abd: No signif RAS.   Hyponatremia    Hypothyroidism    Incisional hernia 11/08/2012   Knee pain    Left carotid bruit    Morbid obesity (HCC)    Multinodular thyroid    OSA (obstructive sleep apnea)    OSA on CPAP    Osteoporosis    Palpitations    Post-traumatic  osteoarthritis of right knee 09/24/2015   Recurrent ventral hernia 11/08/2012   Status post total right knee replacement using cement 05/10/2016   Stroke (HCC) 07/05/2022   right arm,torso and right leg numbness   Tobacco use disorder    Vitamin B12 deficiency    Vitamin D deficiency disease    Past Surgical History:  Procedure Laterality Date   BILATERAL SALPINGOOPHORECTOMY     due to abnormal mass   BREAST BIOPSY Left    neg   BREAST SURGERY Left 20 yrs ago   CESAREAN SECTION     COLONOSCOPY     COLONOSCOPY WITH PROPOFOL N/A 10/16/2017   Procedure: COLONOSCOPY WITH PROPOFOL;  Surgeon: Wyline Mood, MD;  Location: [facility] ENDOSCOPY;  Service: Gastroenterology;  Laterality: N/A;   COLONOSCOPY WITH PROPOFOL N/A 02/16/2021   Procedure: COLONOSCOPY WITH PROPOFOL;  Surgeon: Wyline Mood, MD;  Location: [facility] ENDOSCOPY;  Service: Gastroenterology;  Laterality: N/A;   FORAMINOTOMY 1 LEVEL Right 10/10/2022   Procedure: RIGHT L4-5 LAMINOFORAMINOTOMY;  Surgeon: Venetia Night, MD;  Location: [facility] ORS;  Service: Neurosurgery;  Laterality: Right;   HERNIA  REPAIR  06/2011, July 2014   Ventral wall repair with Physiomesh   HERNIA REPAIR     2nd.vental wall repair   JOINT REPLACEMENT Right    knee   LUMBAR LAMINECTOMY/ DECOMPRESSION WITH MET-RX N/A 10/12/2022   Procedure: LUMBAR LAMINECTOMY/ DECOMPRESSION WITH MET-RX;  Surgeon: Venetia Night, MD;  Location: [facility] ORS;  Service: Neurosurgery;  Laterality: N/A;   TONSILLECTOMY     TOTAL HIP ARTHROPLASTY Left 07/23/2019   Procedure: TOTAL HIP ARTHROPLASTY;  Surgeon: Christena Flake, MD;  Location: [facility] ORS;  Service: Orthopedics;  Laterality: Left;   TOTAL KNEE ARTHROPLASTY Right 05/10/2016   Procedure: TOTAL KNEE ARTHROPLASTY;  Surgeon: Christena Flake, MD;  Location: [facility] ORS;  Service: Orthopedics;  Laterality: Right;   TUBAL LIGATION     Patient Active Problem List   Diagnosis Date Noted   Neurogenic bowel 02/13/2023   Neurogenic bladder 02/13/2023    Abnormality of gait and mobility 02/13/2023   Blister of foot without infection, right, initial encounter 01/05/2023   Bipolar 1 disorder, depressed, moderate (HCC) 11/29/2022   Acute respiratory failure with hypoxia (HCC) 10/13/2022   AKI (acute kidney injury) (HCC) 10/13/2022   Anemia 10/13/2022   Cauda equina syndrome (HCC) 10/12/2022   Epidural hematoma (HCC) 10/12/2022   Mass in epidural space 10/12/2022   Synovial cyst of lumbar facet joint 10/10/2022   Lumbar radiculopathy 10/10/2022   S/P laminectomy 10/10/2022   Preop examination 09/28/2022   Epigastric pain 07/22/2022   Enlarged thyroid 07/22/2022   Chronic diarrhea 07/22/2022   CVA (cerebrovascular accident) (HCC) 07/05/2022   Skin lesion 06/28/2022   BMI 38.0-38.9,adult 07/14/2021   Type 2 diabetes mellitus without complication, without long-term current use of insulin (HCC) 06/16/2021   H/O thyroid nodule 05/31/2021   History of hepatitis C- treated 2014- 2015 with [facility] gastroenterology and followed.  05/19/2021   Left carotid bruit 04/28/2021   Anxiety 05/28/2020   Bipolar affective disorder in remission (HCC) 05/28/2020   Bipolar disorder, in full remission, most recent episode mixed (HCC) 03/06/2020   Need for immunization against influenza 01/27/2020   Routine physical examination 01/27/2020   Morbid obesity (HCC) 01/27/2020   Osteoporosis 11/06/2019   Bipolar 1 disorder, mixed, mild (HCC) 10/29/2019   PTSD (post-traumatic stress disorder) 09/27/2019   Bereavement 09/27/2019   Cocaine use disorder, moderate, in sustained remission (HCC) 09/27/2019   Status post total hip replacement, left 07/23/2019   Primary osteoarthritis of left hip 06/14/2019   Depression 10/23/2018   Allergic rhinitis 08/23/2018   Aortic atherosclerosis (HCC) 02/20/2018   Centrilobular emphysema (HCC) 02/20/2018   Hyperlipidemia 03/28/2017   Degenerative disc disease at L5-S1 level 09/28/2016   Tobacco use disorder 04/21/2016   GERD  (gastroesophageal reflux disease) 12/17/2015   Chronic back pain 10/09/2015   Post-traumatic osteoarthritis of right knee 09/24/2015   Alcohol use disorder, severe, in sustained remission (HCC) 04/13/2015   Vitamin D deficiency 04/09/2015   Vitamin B12 deficiency 04/09/2015   OSA on CPAP 04/09/2015   Subclinical hyperthyroidism 06/26/2014   Goiter colloid, toxic, nodular 06/26/2014   Hypertension 06/26/2014   Tobacco use disorder, continuous 06/26/2014   Bipolar I disorder (HCC) 06/26/2014   Lumbar radiculitis 10/03/2013   Diverticulosis 08/24/2013    ONSET DATE: 10/12/22  REFERRING DIAG: Z61.096 (ICD-10-CM) - Status post lumbar spine surgery for decompression of spinal cord G83.4 (ICD-10-CM) - Cauda equina syndrome (HCC)  THERAPY DIAG:  Muscle weakness (generalized)  Difficulty in walking, not elsewhere classified  Rationale for Evaluation and  Treatment: Rehabilitation  SUBJECTIVE:                                                                                                                                                                                             SUBJECTIVE STATEMENT:  Pt reports 7/10 LBP at today's session. She expresses increased pain may be from packing/ moving over the weekend.   Pt accompanied by: self  PERTINENT HISTORY:  Irona Wagenaar is a 62 y/o F with PMH: HTN, DM2, HLD, ETOH use, THA, PTSD, cocaine, bipolar, and L thalamic infarct (06/2022), s/p lumbar laminoforaminotomy (10/10/22). Presenting post L3-5 decompression with evacuation of epidural hematoma on 10/12/22 for Cauda Equina Syndrome. Pt has residual RLE deficits after her lumbar surgeries leading to ambulating with RW. She was independent prior to her surgeries now reporting balance and gait difficulties. Has had some Worsening RUE N/T since her CVA in February after her lumbar surgeries. Reports N/T 6-7/10 on severity. Mentions some falls when not using her RW. Feels like she always has to have a  hand on something to steady herself.  Pt has residual RLE deficits after her lumbar surgeries leading to ambulating with RW. She was independent prior to her surgeries now reporting balance and gait difficulties. Has had some Worsening RUE N/T since her CVA in February after her lumbar surgeries. Reports N/T 6-7/10 on severity. Mentions some falls when not using her RW. Feels like she always has to have a hand on something to steady herself. Has had some falls anterior standing too close in the RW, a fall walking up the stairs with the RW. She is able to get herself up. Pt describing some unsafe scenarios with poor RW use with transfers and asc/desc curbs.   PAIN:  Are you having pain? No  PRECAUTIONS: Back  RED FLAGS: Bowel or bladder incontinence: Yes: MD aware since surgery.     WEIGHT BEARING RESTRICTIONS: No  FALLS: Has patient fallen in last 6 months? Yes. Number of falls 3  LIVING ENVIRONMENT: Lives with: lives with their son   PLOF: Independent  PATIENT GOALS: Improve her walking and   OBJECTIVE:   DIAGNOSTIC FINDINGS: CLINICAL DATA:  Elective surgery   EXAM: LUMBAR SPINE - 2-3 VIEW   COMPARISON:  10/10/2022   FINDINGS: Intraoperative fluoroscopy is utilized for surgical control purposes. Fluoroscopy time is recorded at 6 seconds. Dose 5.33 mGy. Two spot fluoroscopic images are obtained. Initial image demonstrates a localization marker posterior to the L4 vertebra. Second image demonstrates a localization marker posterior to the L3-4 interspace level.   IMPRESSION: Intraoperative fluoroscopy utilized for surgical control purposes for lumbar  spine localization.     Electronically Signed   By: Burman Nieves M.D.   On: 10/12/2022 20:24  COGNITION: Overall cognitive status: Within functional limits for tasks assessed   SENSATION: Light touch: Impaired  RLE: impaired L2, L4-L5  COORDINATION: Heel to shin normal bilaterally   PROPRIOCEPTION: Normal  bilat   POSTURE:  Heavy BUE support on RW with standing tasks with bilateral upper trap use   LUMBAR AROM: Flexion: 75% limited  Extension: 50% limited  Rotation R/L: 50% limited  Lateral flexion R/L: 50% limited  LOWER EXTREMITY AROM:  L ankle inversion: 20, eversion: 35  R ankle inversion: 15 , eversion: 25    LOWER EXTREMITY MMT:    MMT Right Eval Left Eval  Hip flexion 4- 4  Hip extension    Hip abduction    Hip adduction    Hip internal rotation    Hip external rotation    Knee flexion 4- 4+  Knee extension 4 4+  Ankle dorsiflexion 4- 4+  Ankle plantarflexion    Ankle inversion 3 4+  Ankle eversion 3 4-  (Blank rows = not tested)  TRANSFERS: Assistive device utilized: Environmental consultant - 2 wheeled  Sit to stand: Modified independence Stand to sit: Modified independence  *poor hand placement keeping BUE's on RW for STS and stand to sit despite VC's and education for safe hand placement.   GAIT: Gait pattern: step through pattern, decreased stance time- Right, decreased ankle dorsiflexion- Right, and poor foot clearance- Right. Notable R ankle inversion in stance phase due to evertor weakness Distance walked: 10 meters Assistive device utilized: Walker - 2 wheeled Level of assistance: Modified independence Comments: Heavy BUE support on RW. PT adjusting RW height to reduced B shoulder shrug.   FUNCTIONAL TESTS:  5 times sit to stand: 29.37 seconds Timed up and go (TUG): 26.89 seconds 10 meter walk test: 0.48 m/s with RW  PATIENT SURVEYS:  FOTO 37 with target score of 43  TODAY'S TREATMENT: DATE: 02/21/23  There-ex:  Nustep x5 minutes to increase LE strengthening and lumbar mobility lvl 3.0 seat 7  STS from plinth table (attempt made to complete standing bilat heel raise, unable to complete 2/2 to PF deficits) x12 Seated bilat leg press w/ 35# 2 x10   There-act: 8 minutes Discussed with patient the benefit of getting an OTC orthotic to correct her  over-pronation of her R foot. Assisted pt in finding proper orthotic on [employer], and sending the link to her family member to purchase on behalf of the patient. Education provided on needing orthotic with rigid medial arch support to limit over-pronation and calcaneal eversion.   Neuro re-ed:  STS to ambulation CGA with hurry cane in RUE 2 x25 down and back continuously. 1 x25' w/ down and back.   Stepping over 6" obstacle x2 w/ CGA/HHA w/ hurrycane in RUE (multimodal cueing needed for proper steppage pattern w/ LLE first and hurry cane positioning) 3x 15' down and back   PATIENT EDUCATION: Education details: HEP, safe use of AD, POC Person educated: Patient Education method: Explanation, Demonstration, Verbal cues, and Handouts Education comprehension: verbalized understanding and needs further education  HOME EXERCISE PROGRAM: Access Code: [email or web address] URL: [email or web address] Date: 12/13/2022 Prepared by: Ronnie Derby  Exercises - Supine Bridge  - 1 x daily - 7 x weekly - 3 sets - 6 reps - Seated Scapular Retraction  - 1 x daily - 7 x weekly - 3 sets - 15 reps - Seated Ankle Circles  -  1 x daily - 7 x weekly - 3 sets - 15 reps  GOALS: Goals reviewed with patient? No  SHORT TERM GOALS: Target date: 12/24/22  Pt will be independent with HEP to improve RLE strength for transfers and gait Baseline: 12/13/22: Provided initial HEP 02/02/23: HEP compliant Goal status: MET  2.  Pt will demonstrate safe use of hand placement with transfers with LRAD to demonstrate reduced falls risk. Baseline: 12/13/22: BUE use on AD with STS and stand to sit transfers 02/02/23: Correct hand placement noted  Goal status: MET  LONG TERM GOALS: Target date: 03/07/23  Pt will improve FOTO to target score to demonstrate clinically significant improvement in functional mobility.  Baseline: 12/13/22: 37/43 02/02/23: 43/43 Goal status: MET  2.  Pt will complete TUG in 12 seconds or less with LRAD to  demonstrate reduced falls risk with transfers and short distance gait Baseline: 12/13/22: 26.89 seconds w/ RW, 01/24/23: 44.20 seconds w/ 3 pronged hurry cane 02/02/23: 31.05sec with hurry cane Goal status: ONGOING  3.  Pt will improve 5xSTS to 11.4 seconds or less without UE support to demonstrate improved LE strength to match age based norms.  Baseline: : 12/13/22: 29.37 seconds 02/02/23: 20.20 seconds Goal status: ONGOING  4.  Pt will improve FGA by at least 4 points to demonstrate clinically significant improvement in balance with community activities.  Baseline: 12/13/22: deferred to next session; 12/29/22: 13/30; 02/02/23:14/30 Goal status: ONGOING  5.  Pt will improve gait speed to at least 1.0 m/s with LRAD to demonstrate safe ability to complete short community distances. Baseline: 12/13/22: 0.48 m/s with RW;  02/02/23: 0.32 m/s w/ hurry cane Goal status: ONGOING  6. Pt will increase 6 MWT by > 165' to display improvements in functional endurance with community ambulation.   Baseline: 01/12/23: 429 ft w/ 7/10 NPS 02/02/23: Defer to next session   Goal status: ONGOING   ASSESSMENT:  CLINICAL IMPRESSION: Session focused on progressing bilat LE strengthening and gait training w/ hurry cane along w/ pt education on the potential benefits of an orthotic to correct her R foot over pronation. Pt notes progression in gait training and endurance with the ability to ambulate 2 x25' continuously with the hurry cane along w/ obstacle clearance at today's session. Pt requires vc's for proper cane management to complete obstacle clearance safely along with foot placement of the LLE/RLE. Pt educated on benefits of over pronation orthotic and assisted pt in navigating [employer] to order the appropriate item. Pt continues to note strength deficits in bilat LE's noted w/ inability to perform standing bilat heel raise or seated bilat heel raise on the leg press. Pt would continue to benefit from skilled PT interventions  to address remaining deficits.   OBJECTIVE IMPAIRMENTS: Abnormal gait, decreased balance, decreased mobility, difficulty walking, decreased ROM, decreased strength, impaired sensation, and postural dysfunction.   ACTIVITY LIMITATIONS: carrying, lifting, bending, standing, stairs, transfers, reach over head, and locomotion level  PARTICIPATION LIMITATIONS: cleaning, laundry, driving, shopping, and community activity  PERSONAL FACTORS: Age, Past/current experiences, and Time since onset of injury/illness/exacerbation are also affecting patient's functional outcome.   REHAB POTENTIAL: Good  CLINICAL DECISION MAKING: Evolving/moderate complexity  EVALUATION COMPLEXITY: Moderate  PLAN:  PT FREQUENCY: 2x/week  PT DURATION: 12 weeks  PLANNED INTERVENTIONS: Therapeutic exercises, Therapeutic activity, Neuromuscular re-education, Balance training, Gait training, Patient/Family education, Self Care, Stair training, DME instructions, Manual therapy, and Re-evaluation  PLAN FOR NEXT SESSION: Continue gait training w/ hurry cane and bilat LE strengthening exercises.  Lovie Macadamia, SPT  Delphia Grates. Fairly IV, PT, DPT Physical Therapist- [place]  [facility]  02/22/23, 8:39 AM

## 2023-02-22 NOTE — Progress Notes (Signed)
Name: Susan [name] DOB: [birth year] MRN: [id number]  History of Present Illness: [name] is a 62 y.o. female who presents today as a new patient at [facility]. All available relevant medical records have been reviewed.  - GU History: 1. Neurogenic bladder.  She reports that since her back surgeries on 10/10/2022 and 10/12/2022 she has been having new bothersome urinary symptoms including increased urinary urgency, nocturia x2, and urge incontinence. She is going through 2 pullups per day. She previously took Oxybutynin XL 10 mg in 2023 and that was helpful; didn't have any significant side effects that she can recall.  Reports that most of the time when she voids volitionally she experiences urinary hesitancy and straining to void; she often has to do positional voiding to empty completely (leaning forward).   Also reports stress urinary incontinence for many years which is not significantly bothersome.   Today she reports concern for possible UTI - over the past month she has had dysuria. She denies increased daytime urinary frequency, gross hematuria, flank pain, abdominal pain, fevers, nausea, or vomiting. She denies history of recurrent UTI.  Reports possible distant history of kidney stone passing about 30 years ago. Denies any prior stone procedure.   She is following with GI for neurogenic bowel. Reports constipation with liquid stool coming around the impacted stool resulting in fecal incontinence. She is going to be starting a new treatment regimen tomorrow with Colace to address that problem.   Fall Screening: Do you usually have a device to assist in your mobility? Yes - walker  Medications: Current Outpatient Medications  Medication Sig Dispense Refill   acetaminophen (TYLENOL) 500 MG tablet Take 1,000 mg by mouth 2 (two) times daily.     albuterol (VENTOLIN HFA) 108 (90 Base) MCG/ACT inhaler Inhale 2 puffs into the lungs every 6 (six) hours as needed  for wheezing or shortness of breath. 8 g 2   aspirin EC 81 MG tablet Take by mouth.     bisacodyl (MAGIC BULLETS) 10 MG suppository Place 1 suppository (10 mg total) rectally as needed for moderate constipation (if no bowel movement in the morning with colace). 12 suppository 0   carvedilol (COREG) 12.5 MG tablet Take 12.5 mg by mouth 2 (two) times daily.     celecoxib (CELEBREX) 100 MG capsule TAKE 1 CAPSULE(100 MG) BY MOUTH TWICE DAILY 60 capsule 0   clotrimazole (LOTRIMIN) 1 % cream Apply 1 application  topically 2 (two) times daily as needed.     cyclobenzaprine (FLEXERIL) 5 MG tablet Take 5 mg by mouth 2 (two) times daily.     docusate sodium (COLACE) 100 MG capsule Take 1 capsule (100 mg total) by mouth daily. Take with breakfast 30 capsule 5   DULoxetine (CYMBALTA) 20 MG capsule TAKE ONE CAPSULE BY MOUTH DAILY WITH 30 MG 30 capsule 3   DULoxetine (CYMBALTA) 30 MG capsule TAKE ONE CAPSULE BY MOUTH DAILY with 20 mg cap. 30 capsule 3   HYDROcodone-acetaminophen (NORCO/VICODIN) 5-325 MG tablet Take 1 tablet by mouth daily as needed for moderate pain.     lurasidone (LATUDA) 20 MG TABS tablet Take 1 tablet (20 mg total) by mouth daily with supper. 30 tablet 3   naloxone (NARCAN) nasal spray 4 mg/0.1 mL Place into the nose.     OZEMPIC, 1 MG/DOSE, 4 MG/3ML SOPN INJECT 1MG  UNDER THE SKIN (SUBCUTANEOUSLY) ONCE WEEKLY ON SUNDAY 3 mL 1   pregabalin (LYRICA) 75 MG capsule Take 1 capsule (75  mg total) by mouth 2 (two) times daily for 5 days, THEN 2 capsules (150 mg total) 2 (two) times daily for 25 days. Start with 1 capsule (75 mg) of pregabalin twice daily AND reduce your gabapentin half your normal dose (300 mg twice daily) for 5 days. On day 6, stop gabapentin and increase pregabalin to 2 capsules (150 mg) twice daily.. 110 capsule 0   [START ON 03/18/2023] pregabalin (LYRICA) 75 MG capsule Take 2 capsules (150 mg total) by mouth 2 (two) times daily. 120 capsule 1   rosuvastatin (CRESTOR) 20 MG tablet  Take 1 tablet (20 mg total) by mouth daily. 90 tablet 3   spironolactone (ALDACTONE) 25 MG tablet TAKE ONE TABLET BY MOUTH DAILY 90 tablet 1   sulfamethoxazole-trimethoprim (BACTRIM DS) 800-160 MG tablet Take 1 tablet by mouth every 12 (twelve) hours. 14 tablet 0   telmisartan (MICARDIS) 80 MG tablet TAKE ONE TABLET BY MOUTH DAILY 90 tablet 1   traZODone (DESYREL) 50 MG tablet TAKE 1 TABLET(50 MG) BY MOUTH AT BEDTIME 30 tablet 1   Vibegron (GEMTESA) 75 MG TABS Take 1 tablet (75 mg total) by mouth daily. 30 tablet 5   Vitamin D, Ergocalciferol, (DRISDOL) 1.25 MG (50000 UNIT) CAPS capsule TAKE ONE CAPSULE BY MOUTH ONCE WEEKLY ON SUNDAY 13 capsule 1   No current facility-administered medications for this visit.    Allergies: Allergies  Allergen Reactions   Wellbutrin [Bupropion]     Patient reports made " deathly sick " years ago at appointment 03/18/20.   Furosemide Other (See Comments)    Electrolyte imbalance  Other Reaction(s): Other (See Comments)  Electrolyte imbalance Electrolyte imbalance Electrolyte imbalance    Electrolyte imbalance   Hydrochlorothiazide Other (See Comments)    Electrolyte imbalance  Other Reaction(s): Dizziness, Other (See Comments)    Past Medical History:  Diagnosis Date   ADD (attention deficit disorder)    Alcohol abuse    Anemia    Anxiety    Aortic atherosclerosis (HCC) 02/20/2018   Chest CT Sept 2019   Arthritis    rheumatoid arthritis   Asthma    Bipolar disorder (HCC)    Centrilobular emphysema (HCC)    Cocaine use disorder, moderate, in sustained remission (HCC)    Constipation    Degenerative disc disease at L5-S1 level 09/28/2016   See ortho note May 2018   Depression    bipolar, hx of suicide attempt   Diabetes mellitus, type 2 (HCC)    Diverticulitis 2013   DOE (dyspnea on exertion)    Drug use    Family history of adverse reaction to anesthesia    mom-delayed emergence   GERD (gastroesophageal reflux disease)    H/O  suicide attempt    slit wrists   Hepatitis C 06/26/2014   treated with Harvoni   Hip pain    History of echocardiogram    a. 04/2019 Echo: EF >65%, nl RV fxn.   History of MRSA infection 2013   HLD (hyperlipidemia)    Hypertension    a. 06/2021 Renal Duplex: ? bilat RAS; b. 06/2021 CTA Abd: No signif RAS.   Hyponatremia    Hypothyroidism    Incisional hernia 11/08/2012   Knee pain    Left carotid bruit    Morbid obesity (HCC)    Multinodular thyroid    OSA (obstructive sleep apnea)    OSA on CPAP    Osteoporosis    Palpitations    Post-traumatic osteoarthritis of right knee 09/24/2015  Recurrent ventral hernia 11/08/2012   Status post total right knee replacement using cement 05/10/2016   Stroke (HCC) 07/05/2022   right arm,torso and right leg numbness   Tobacco use disorder    Vitamin B12 deficiency    Vitamin D deficiency disease    Past Surgical History:  Procedure Laterality Date   BILATERAL SALPINGOOPHORECTOMY     due to abnormal mass   BREAST BIOPSY Left    neg   BREAST SURGERY Left 20 yrs ago   CESAREAN SECTION     COLONOSCOPY     COLONOSCOPY WITH PROPOFOL N/A 10/16/2017   Procedure: COLONOSCOPY WITH PROPOFOL;  Surgeon: Wyline Mood, MD;  Location: [facility] ENDOSCOPY;  Service: Gastroenterology;  Laterality: N/A;   COLONOSCOPY WITH PROPOFOL N/A 02/16/2021   Procedure: COLONOSCOPY WITH PROPOFOL;  Surgeon: Wyline Mood, MD;  Location[facility] ENDOSCOPY;  Service: Gastroenterology;  Laterality: N/A;   FORAMINOTOMY 1 LEVEL Right 10/10/2022   Procedure: RIGHT L4-5 LAMINOFORAMINOTOMY;  Surgeon: Venetia Night, MD;  Location: [facility] ORS;  Service: Neurosurgery;  Laterality: Right;   HERNIA REPAIR  06/2011, July 2014   Ventral wall repair with Physiomesh   HERNIA REPAIR     2nd.vental wall repair   JOINT REPLACEMENT Right    knee   LUMBAR LAMINECTOMY/ DECOMPRESSION WITH MET-RX N/A 10/12/2022   Procedure: LUMBAR LAMINECTOMY/ DECOMPRESSION WITH MET-RX;  Surgeon: Venetia Night, MD;  Location: [facility] ORS;  Service: Neurosurgery;  Laterality: N/A;   TONSILLECTOMY     TOTAL HIP ARTHROPLASTY Left 07/23/2019   Procedure: TOTAL HIP ARTHROPLASTY;  Surgeon: Christena Flake, MD;  Location: [facility] ORS;  Service: Orthopedics;  Laterality: Left;   TOTAL KNEE ARTHROPLASTY Right 05/10/2016   Procedure: TOTAL KNEE ARTHROPLASTY;  Surgeon: Christena Flake, MD;  Location: [facility] ORS;  Service: Orthopedics;  Laterality: Right;   TUBAL LIGATION     Family History  Problem Relation Age of Onset   Arthritis Mother    Asthma Mother    Mental illness Mother    Thyroid disease Mother    COPD Mother    Heart disease Mother    Congestive Heart Failure Mother    Alcohol abuse Mother    Eating disorder Mother    Bipolar disorder Mother    Alcohol abuse Father    Heart attack Father    Alcohol abuse Sister    Drug abuse Sister    Mental illness Sister    Mental illness Sister    Fibromyalgia Sister    Obesity Sister    Pneumonia Sister    Depression Sister    Mental illness Sister    Alcohol abuse Sister    Drug abuse Sister    Arthritis Brother    Mental illness Brother    Cancer Brother        non-hodkins lymphoma   Drug abuse Daughter    Drug abuse Son    Alcohol abuse Son    Drug abuse Son    Alcohol abuse Son    Diabetes Neg Hx    Stroke Neg Hx    Breast cancer Neg Hx    Thyroid cancer Neg Hx    Social History   Socioeconomic History   Marital status: Widowed    Spouse name: Not on file   Number of children: 3   Years of education: Not on file   Highest education level: Some college, no degree  Occupational History   Occupation: disabled  Tobacco Use   Smoking status: Every Day  Current packs/day: 0.50    Average packs/day: 0.5 packs/day for 41.0 years (20.5 ttl pk-yrs)    Types: Cigarettes   Smokeless tobacco: Never  Vaping Use   Vaping status: Every Day   Substances: Nicotine  Substance and Sexual Activity   Alcohol use: No    Alcohol/week: 0.0  standard drinks of 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   Drug use: No    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   Sexual activity: Not Currently  Other Topics Concern   Not on file  Social History Narrative   Not on file   Social Determinants of Health   Financial Resource Strain: Low Risk  (06/02/2021)   Overall Financial Resource Strain (CARDIA)    Difficulty of Paying Living Expenses: Not hard at all  Food Insecurity: No Food Insecurity (10/24/2022)   Hunger Vital Sign    Worried About Running Out of Food in the Last Year: Never true    Ran Out of Food in the Last Year: Never true  Transportation Needs: No Transportation Needs (10/24/2022)   PRAPARE - Administrator, Civil Service (Medical): No    Lack of Transportation (Non-Medical): No  Physical Activity: Sufficiently Active (06/02/2021)   Exercise Vital Sign    Days of Exercise per Week: 5 days    Minutes of Exercise per Session: 30 min  Stress: No Stress Concern Present (06/02/2021)   [employer] of Occupational Health - Occupational Stress Questionnaire    Feeling of Stress : Not at all  Social Connections: Moderately Isolated (06/02/2021)   Social Connection and Isolation Panel [NHANES]    Frequency of Communication with Friends and Family: More than three times a week    Frequency of Social Gatherings with Friends and Family: Three times a week    Attends Religious Services: Never    Active Member of Clubs or Organizations: Yes    Attends Banker Meetings: More than 4 times per year    Marital Status: Widowed  Intimate Partner Violence: Not At Risk (10/12/2022)   Humiliation, Afraid, Rape, and Kick questionnaire    Fear of Current or Ex-Partner: No    Emotionally Abused: No    Physically Abused: No    Sexually Abused: No    SUBJECTIVE  Review of Systems Constitutional: Patient denies any unintentional weight loss or change in strength lntegumentary: Patient denies  any rashes or pruritus Eyes: Patient denies dry eyes ENT: Patient denies dry mouth Cardiovascular: Patient denies chest pain or syncope Respiratory: Patient denies shortness of breath Gastrointestinal: As per HPI Musculoskeletal: Patient denies muscle cramps or weakness Neurologic: Patient denies convulsions or seizures Allergic/Immunologic: Patient denies recent allergic reaction(s) Hematologic/Lymphatic: Patient denies bleeding tendencies Endocrine: Patient denies heat/cold intolerance  GU: As per HPI.  OBJECTIVE Vitals:   03/01/23 0929  BP: 131/81  Pulse: 65   Body mass index is 42.14 kg/m.  Physical Examination Constitutional: No obvious distress; patient is non-toxic appearing  Cardiovascular: No visible lower extremity edema.  Respiratory: The patient does not have audible wheezing/stridor; respirations do not appear labored  Gastrointestinal: Abdomen non-distended Musculoskeletal: Normal ROM of UEs  Skin: No obvious rashes/open sores  Neurologic: CN 2-12 grossly intact Psychiatric: Answered questions appropriately with normal affect  Hematologic/Lymphatic/Immunologic: No obvious bruises or sites of spontaneous bleeding  UA: >30 WBC/hpf, 3-10 RBC/hpf, bacteria (many) PVR: 41 ml  ASSESSMENT Neurogenic bladder - Plan: BLADDER SCAN AMB NON-IMAGING, Urinalysis, Routine w reflex microscopic, Vibegron (GEMTESA)  75 MG TABS  Urge incontinence - Plan: Vibegron (GEMTESA) 75 MG TABS  Abnormal urinalysis - Plan: sulfamethoxazole-trimethoprim (BACTRIM DS) 800-160 MG tablet  UTI symptoms - Plan: sulfamethoxazole-trimethoprim (BACTRIM DS) 800-160 MG tablet  Abnormal UA. Will check urine culture and treat empirically with Bactrim while awaiting culture results and sensitivities.  We discussed her diagnosis of neurogenic bladder with OAB symptoms, which seems to have been caused by her lumbar surgery / radiculopathy with cauda equina syndrome.  - Additional risk factors for  neurogenic bladder however which were discussed including prior stroke, T2DM, tobacco use, prior substance abuse (alcohol and cocaine).  - Symptoms likely exacerbated by what sounds like fairly significant caffeine intake.   We discussed how her neurogenic bowel with constipation is likely contributing to urethral compression with related voiding dysfunction (hesitancy / straining to empty bladder completely), which may improve once her constipation is under control. Also discussed possible high-tone pelvic floor dysfunction as possible contributing factor; she is already taking Flexeril 10 mg nightly at bedtime and was advised to continue that for pelvic floor muscle relaxation.   For urinary urgency / UUI she did well on Oxybutynin XL previously, however we agreed to avoid anticholinergic medications due to the risk of exacerbating her significant constipation due to neurogenic bowel. She agreed to trial Gemtesa 75 mg daily as an alternative; we discussed the risk for urinary retention with that.   Will plan for follow up in 6 weeks or sooner if needed. Pt verbalized understanding and agreement. All questions were answered.  PLAN Advised the following: 1. Urine culture. 2. Bactrim DS 2x/day x7 days. 3. Gemtesa 75 mg daily (samples given today). 4. Minimize caffeine intake. 5. Constipation management as per GI provider. 6. Return in about 6 weeks (around 04/12/2023) for UA, PVR, & f/u with Evette Georges NP.  Orders Placed This Encounter  Procedures   Microscopic Examination   Urinalysis, Routine w reflex microscopic   BLADDER SCAN AMB NON-IMAGING    It has been explained that the patient is to follow regularly with their PCP in addition to all other providers involved in their care and to follow instructions provided by these respective offices. Patient advised to contact urology clinic if any urologic-pertaining questions, concerns, new symptoms or problems arise in the interim  period.  Patient Instructions      Recommendations regarding UTI prevention / management:  When UTI symptoms occur: Call urology office to request order for urine culture. We recommend waiting for urine culture result prior to use of any antibiotics.  For bladder pain/ burning with urination: Over the counter Pyridium (phenazopyridine) as needed (commonly known under the "AZO" brand). No more than 3 days consecutively at a time due to risk for methemoglobinemia, liver function issues, and bone health damage with long term use of Pyridium.  Routine use for UTI prevention: Adequate fluid intake (>1.5 liters/day) to flush out the urinary tract. - Go to the bathroom to urinate every 4-6 hours while awake to minimize urinary stasis / bacterial overgrowth in the bladder. - Proanthocyanidin (PAC) supplement 36 mg daily; must be soluble (insoluble form of PAC will be ineffective). Recommended brand: Ellura. This is an over-the-counter supplement (often must be found/ purchased online) supplement derived from cranberries with concentrated active component: Proanthocyanidin (PAC) 36 mg daily. Decreases bacterial adherence to bladder lining. Not recommended for patients with interstitial cystitis due to acidity. - D-mannose powder (2 grams daily). This is an over-the-counter supplement which decreases bacterial adherence to bladder lining (it  is a sugar that inhibits bacterial adherence to urothelial cells by binding to the pili of enteric bacteria). Take as per manufacturer recommendation. Can be used as an alternative or in addition to the concentrated cranberry supplement. Not recommended for diabetic patients due to sugar content. - Vitamin C supplement to acidify urine to minimize bacterial growth. Not recommended for patients with interstitial cystitis due to acidity. - Probiotic to maintain healthy vaginal microbiome to suppress bacteria at urethral opening. Brand recommendations: Feminine Balance  (highest concentration of lactobacillus) or Hyperbiotic Pro 15.  Note for patients with diabetes: You may read about D-mannose powder for UTI prevention. That is an over the-counter supplement which decreases bacterial adherence to bladder lining. I would NOT advise that for you as a person with diabetes due to its sugar content.    Electronically signed by:  Donnita Falls, MSN, FNP-C, CUNP 03/01/2023 11:00 AM

## 2023-02-27 ENCOUNTER — Ambulatory Visit: Payer: 59

## 2023-02-27 DIAGNOSIS — R262 Difficulty in walking, not elsewhere classified: Secondary | ICD-10-CM

## 2023-02-27 DIAGNOSIS — M6281 Muscle weakness (generalized): Secondary | ICD-10-CM

## 2023-02-27 NOTE — Therapy (Addendum)
OUTPATIENT PHYSICAL THERAPY NEURO TREATMENT  Patient Name: Susan [name] MRN: [id number] DOB:23-Dec-1960, 62 y.o., female Today's Date: 02/27/2023   PCP: [name] MD REFERRING PROVIDER: Venetia Night MD  END OF SESSION:  PT End of Session - 02/27/23 1032     Visit Number 14    Number of Visits 25    Date for PT Re-Evaluation 03/07/23    PT Start Time 1032    PT Stop Time 1115    PT Time Calculation (min) 43 min    Equipment Utilized During Treatment Gait belt    Activity Tolerance Patient tolerated treatment well    Behavior During Therapy WFL for tasks assessed/performed             Past Medical History:  Diagnosis Date   ADD (attention deficit disorder)    Alcohol abuse    Anemia    Anxiety    Aortic atherosclerosis (HCC) 02/20/2018   Chest CT Sept 2019   Arthritis    rheumatoid arthritis   Asthma    Bipolar disorder (HCC)    Centrilobular emphysema (HCC)    Cocaine use disorder, moderate, in sustained remission (HCC)    Constipation    Degenerative disc disease at L5-S1 level 09/28/2016   See ortho note May 2018   Depression    bipolar, hx of suicide attempt   Diabetes mellitus, type 2 (HCC)    Diverticulitis 2013   DOE (dyspnea on exertion)    Drug use    Family history of adverse reaction to anesthesia    mom-delayed emergence   GERD (gastroesophageal reflux disease)    H/O suicide attempt    slit wrists   Hepatitis C 06/26/2014   treated with Harvoni   Hip pain    History of echocardiogram    a. 04/2019 Echo: EF >65%, nl RV fxn.   History of MRSA infection 2013   HLD (hyperlipidemia)    Hypertension    a. 06/2021 Renal Duplex: ? bilat RAS; b. 06/2021 CTA Abd: No signif RAS.   Hyponatremia    Hypothyroidism    Incisional hernia 11/08/2012   Knee pain    Left carotid bruit    Morbid obesity (HCC)    Multinodular thyroid    OSA (obstructive sleep apnea)    OSA on CPAP    Osteoporosis    Palpitations    Post-traumatic  osteoarthritis of right knee 09/24/2015   Recurrent ventral hernia 11/08/2012   Status post total right knee replacement using cement 05/10/2016   Stroke (HCC) 07/05/2022   right arm,torso and right leg numbness   Tobacco use disorder    Vitamin B12 deficiency    Vitamin D deficiency disease    Past Surgical History:  Procedure Laterality Date   BILATERAL SALPINGOOPHORECTOMY     due to abnormal mass   BREAST BIOPSY Left    neg   BREAST SURGERY Left 20 yrs ago   CESAREAN SECTION     COLONOSCOPY     COLONOSCOPY WITH PROPOFOL N/A 10/16/2017   Procedure: COLONOSCOPY WITH PROPOFOL;  Surgeon: Wyline Mood, MD;  Location: [facility] ENDOSCOPY;  Service: Gastroenterology;  Laterality: N/A;   COLONOSCOPY WITH PROPOFOL N/A 02/16/2021   Procedure: COLONOSCOPY WITH PROPOFOL;  Surgeon: Wyline Mood, MD;  Location: [facility] ENDOSCOPY;  Service: Gastroenterology;  Laterality: N/A;   FORAMINOTOMY 1 LEVEL Right 10/10/2022   Procedure: RIGHT L4-5 LAMINOFORAMINOTOMY;  Surgeon: Venetia Night, MD;  Location: [facility] ORS;  Service: Neurosurgery;  Laterality: Right;   HERNIA  REPAIR  06/2011, July 2014   Ventral wall repair with Physiomesh   HERNIA REPAIR     2nd.vental wall repair   JOINT REPLACEMENT Right    knee   LUMBAR LAMINECTOMY/ DECOMPRESSION WITH MET-RX N/A 10/12/2022   Procedure: LUMBAR LAMINECTOMY/ DECOMPRESSION WITH MET-RX;  Surgeon: Venetia Night, MD;  Location: [facility] ORS;  Service: Neurosurgery;  Laterality: N/A;   TONSILLECTOMY     TOTAL HIP ARTHROPLASTY Left 07/23/2019   Procedure: TOTAL HIP ARTHROPLASTY;  Surgeon: Christena Flake, MD;  Location: [facility] ORS;  Service: Orthopedics;  Laterality: Left;   TOTAL KNEE ARTHROPLASTY Right 05/10/2016   Procedure: TOTAL KNEE ARTHROPLASTY;  Surgeon: Christena Flake, MD;  Location: [facility] ORS;  Service: Orthopedics;  Laterality: Right;   TUBAL LIGATION     Patient Active Problem List   Diagnosis Date Noted   Neurogenic bowel 02/13/2023   Neurogenic bladder 02/13/2023    Abnormality of gait and mobility 02/13/2023   Blister of foot without infection, right, initial encounter 01/05/2023   Bipolar 1 disorder, depressed, moderate (HCC) 11/29/2022   Acute respiratory failure with hypoxia (HCC) 10/13/2022   AKI (acute kidney injury) (HCC) 10/13/2022   Anemia 10/13/2022   Cauda equina syndrome (HCC) 10/12/2022   Epidural hematoma (HCC) 10/12/2022   Mass in epidural space 10/12/2022   Synovial cyst of lumbar facet joint 10/10/2022   Lumbar radiculopathy 10/10/2022   S/P laminectomy 10/10/2022   Epigastric pain 07/22/2022   Enlarged thyroid 07/22/2022   Chronic diarrhea 07/22/2022   CVA (cerebrovascular accident) (HCC) 07/05/2022   Skin lesion 06/28/2022   BMI 38.0-38.9,adult 07/14/2021   Type 2 diabetes mellitus without complication, without long-term current use of insulin (HCC) 06/16/2021   H/O thyroid nodule 05/31/2021   History of hepatitis C- treated 2014- 2015 with [facility] gastroenterology and followed.  05/19/2021   Left carotid bruit 04/28/2021   Anxiety 05/28/2020   Bipolar affective disorder in remission (HCC) 05/28/2020   Bipolar disorder, in full remission, most recent episode mixed (HCC) 03/06/2020   Need for immunization against influenza 01/27/2020   Morbid obesity (HCC) 01/27/2020   Osteoporosis 11/06/2019   Bipolar 1 disorder, mixed, mild (HCC) 10/29/2019   PTSD (post-traumatic stress disorder) 09/27/2019   Bereavement 09/27/2019   Cocaine use disorder, moderate, in sustained remission (HCC) 09/27/2019   Status post total hip replacement, left 07/23/2019   Primary osteoarthritis of left hip 06/14/2019   Depression 10/23/2018   Allergic rhinitis 08/23/2018   Aortic atherosclerosis (HCC) 02/20/2018   Centrilobular emphysema (HCC) 02/20/2018   Hyperlipidemia 03/28/2017   Degenerative disc disease at L5-S1 level 09/28/2016   Tobacco use disorder 04/21/2016   GERD (gastroesophageal reflux disease) 12/17/2015   Chronic back pain 10/09/2015    Post-traumatic osteoarthritis of right knee 09/24/2015   Alcohol use disorder, severe, in sustained remission (HCC) 04/13/2015   Vitamin D deficiency 04/09/2015   Vitamin B12 deficiency 04/09/2015   OSA on CPAP 04/09/2015   Subclinical hyperthyroidism 06/26/2014   Goiter colloid, toxic, nodular 06/26/2014   Hypertension 06/26/2014   Tobacco use disorder, continuous 06/26/2014   Bipolar I disorder (HCC) 06/26/2014   Lumbar radiculitis 10/03/2013   Diverticulosis 08/24/2013    ONSET DATE: 10/12/22  REFERRING DIAG: O13.086 (ICD-10-CM) - Status post lumbar spine surgery for decompression of spinal cord G83.4 (ICD-10-CM) - Cauda equina syndrome (HCC)  THERAPY DIAG:  Muscle weakness (generalized)  Difficulty in walking, not elsewhere classified  Rationale for Evaluation and Treatment: Rehabilitation  SUBJECTIVE:  SUBJECTIVE STATEMENT:  Pt reports 6/10 LBP at today's session. Pt ordered her over pronation orthotics and is waiting for them to come in. Pt reports utilizing her cane more around her home. No notable changes over the weekend.   Pt accompanied by: self  PERTINENT HISTORY:  Sherrianne Jacome is a 62 y/o F with PMH: HTN, DM2, HLD, ETOH use, THA, PTSD, cocaine, bipolar, and L thalamic infarct (06/2022), s/p lumbar laminoforaminotomy (10/10/22). Presenting post L3-5 decompression with evacuation of epidural hematoma on 10/12/22 for Cauda Equina Syndrome. Pt has residual RLE deficits after her lumbar surgeries leading to ambulating with RW. She was independent prior to her surgeries now reporting balance and gait difficulties. Has had some Worsening RUE N/T since her CVA in February after her lumbar surgeries. Reports N/T 6-7/10 on severity. Mentions some falls when not using her RW. Feels like she always  has to have a hand on something to steady herself.  Pt has residual RLE deficits after her lumbar surgeries leading to ambulating with RW. She was independent prior to her surgeries now reporting balance and gait difficulties. Has had some Worsening RUE N/T since her CVA in February after her lumbar surgeries. Reports N/T 6-7/10 on severity. Mentions some falls when not using her RW. Feels like she always has to have a hand on something to steady herself. Has had some falls anterior standing too close in the RW, a fall walking up the stairs with the RW. She is able to get herself up. Pt describing some unsafe scenarios with poor RW use with transfers and asc/desc curbs.   PAIN:  Are you having pain? No  PRECAUTIONS: Back  RED FLAGS: Bowel or bladder incontinence: Yes: MD aware since surgery.     WEIGHT BEARING RESTRICTIONS: No  FALLS: Has patient fallen in last 6 months? Yes. Number of falls 3  LIVING ENVIRONMENT: Lives with: lives with their son   PLOF: Independent  PATIENT GOALS: Improve her walking and   OBJECTIVE:   DIAGNOSTIC FINDINGS: CLINICAL DATA:  Elective surgery   EXAM: LUMBAR SPINE - 2-3 VIEW   COMPARISON:  10/10/2022   FINDINGS: Intraoperative fluoroscopy is utilized for surgical control purposes. Fluoroscopy time is recorded at 6 seconds. Dose 5.33 mGy. Two spot fluoroscopic images are obtained. Initial image demonstrates a localization marker posterior to the L4 vertebra. Second image demonstrates a localization marker posterior to the L3-4 interspace level.   IMPRESSION: Intraoperative fluoroscopy utilized for surgical control purposes for lumbar spine localization.     Electronically Signed   By: Burman Nieves M.D.   On: 10/12/2022 20:24  COGNITION: Overall cognitive status: Within functional limits for tasks assessed   SENSATION: Light touch: Impaired  RLE: impaired L2, L4-L5  COORDINATION: Heel to shin normal bilaterally    PROPRIOCEPTION: Normal bilat   POSTURE:  Heavy BUE support on RW with standing tasks with bilateral upper trap use   LUMBAR AROM: Flexion: 75% limited  Extension: 50% limited  Rotation R/L: 50% limited  Lateral flexion R/L: 50% limited  LOWER EXTREMITY AROM:  L ankle inversion: 20, eversion: 35  R ankle inversion: 15 , eversion: 25    LOWER EXTREMITY MMT:    MMT Right Eval Left Eval  Hip flexion 4- 4  Hip extension    Hip abduction    Hip adduction    Hip internal rotation    Hip external rotation    Knee flexion 4- 4+  Knee extension 4 4+  Ankle dorsiflexion 4- 4+  Ankle  plantarflexion    Ankle inversion 3 4+  Ankle eversion 3 4-  (Blank rows = not tested)  TRANSFERS: Assistive device utilized: Environmental consultant - 2 wheeled  Sit to stand: Modified independence Stand to sit: Modified independence  *poor hand placement keeping BUE's on RW for STS and stand to sit despite VC's and education for safe hand placement.   GAIT: Gait pattern: step through pattern, decreased stance time- Right, decreased ankle dorsiflexion- Right, and poor foot clearance- Right. Notable R ankle inversion in stance phase due to evertor weakness Distance walked: 10 meters Assistive device utilized: Walker - 2 wheeled Level of assistance: Modified independence Comments: Heavy BUE support on RW. PT adjusting RW height to reduced B shoulder shrug.   FUNCTIONAL TESTS:  5 times sit to stand: 29.37 seconds Timed up and go (TUG): 26.89 seconds 10 meter walk test: 0.48 m/s with RW  PATIENT SURVEYS:  FOTO 37 with target score of 43  TODAY'S TREATMENT: DATE: 02/27/23  There-ex:  Nustep x5 minutes to increase LE strengthening and lumbar mobility lvl 3.0 seat 7  Mini squats at elevated plinth table 2 x10 w/ 2KG medicine ball  Seated heel raises w/ 6# DB at distal thighs of bilat LE's 2 x10 Seated inversion w/ GTB LLE only 2 x10  Neuro re-ed:  STS to ambulation CGA with hurry cane in RUE 2  x25 down and back continuously. Stepping over 6" obstacle x2 w/ CGA/HHA w/ hurrycane in RUE (multimodal cueing needed for proper steppage pattern w/ LLE first and hurry cane positioning) x 15' down and back Stepping onto 4" aerobic step w/ 1 UE support and hurrycane in RUE (multimodal cueing needed for proper steppage pattern w/ LLE first and hurry cane positioning) MinA needed to assist with stepping up onto step.   PATIENT EDUCATION: Education details: HEP, safe use of AD, POC Person educated: Patient Education method: Explanation, Demonstration, Verbal cues, and Handouts Education comprehension: verbalized understanding and needs further education  HOME EXERCISE PROGRAM: Access Code: [email or web address] URL: [email or web address] Date: 12/13/2022 Prepared by: Ronnie Derby  Exercises - Supine Bridge  - 1 x daily - 7 x weekly - 3 sets - 6 reps - Seated Scapular Retraction  - 1 x daily - 7 x weekly - 3 sets - 15 reps - Seated Ankle Circles  - 1 x daily - 7 x weekly - 3 sets - 15 reps  GOALS: Goals reviewed with patient? No  SHORT TERM GOALS: Target date: 12/24/22  Pt will be independent with HEP to improve RLE strength for transfers and gait Baseline: 12/13/22: Provided initial HEP 02/02/23: HEP compliant Goal status: MET  2.  Pt will demonstrate safe use of hand placement with transfers with LRAD to demonstrate reduced falls risk. Baseline: 12/13/22: BUE use on AD with STS and stand to sit transfers 02/02/23: Correct hand placement noted  Goal status: MET  LONG TERM GOALS: Target date: 03/07/23  Pt will improve FOTO to target score to demonstrate clinically significant improvement in functional mobility.  Baseline: 12/13/22: 37/43 02/02/23: 43/43 Goal status: MET  2.  Pt will complete TUG in 12 seconds or less with LRAD to demonstrate reduced falls risk with transfers and short distance gait Baseline: 12/13/22: 26.89 seconds w/ RW, 01/24/23: 44.20 seconds w/ 3 pronged hurry cane  02/02/23: 31.05sec with hurry cane Goal status: ONGOING  3.  Pt will improve 5xSTS to 11.4 seconds or less without UE support to demonstrate improved LE strength to match age based norms.  Baseline: :  12/13/22: 29.37 seconds 02/02/23: 20.20 seconds Goal status: ONGOING  4.  Pt will improve FGA by at least 4 points to demonstrate clinically significant improvement in balance with community activities.  Baseline: 12/13/22: deferred to next session; 12/29/22: 13/30; 02/02/23:14/30 Goal status: ONGOING  5.  Pt will improve gait speed to at least 1.0 m/s with LRAD to demonstrate safe ability to complete short community distances. Baseline: 12/13/22: 0.48 m/s with RW;  02/02/23: 0.32 m/s w/ hurry cane Goal status: ONGOING  6. Pt will increase 6 MWT by > 165' to display improvements in functional endurance with community ambulation.   Baseline: 01/12/23: 429 ft w/ 7/10 NPS 02/02/23: Defer to next session   Goal status: ONGOING   ASSESSMENT:  CLINICAL IMPRESSION: Session focused on progressing bilat LE strengthening and gait training w/ hurry cane along. Pt notes progression in gait training and obstacle negotiation with the ability to ambulate 2 x25' with the hurry cane along w/ proper stepping onto 4" aerobic step. Pt continues to note posterior lean with ambulation, requiring v/c to correct posturing. Multimodal cueing also utilized to cue proper hurrycane placement and LE negotiation onto 4" aerobic step, to simulate curb management techniques. Pt notes difficulty with initial stepping up onto obstacle, signifying bilat LE weakness. Pt would continue to benefit from skilled PT interventions to address remaining deficits.   OBJECTIVE IMPAIRMENTS: Abnormal gait, decreased balance, decreased mobility, difficulty walking, decreased ROM, decreased strength, impaired sensation, and postural dysfunction.   ACTIVITY LIMITATIONS: carrying, lifting, bending, standing, stairs, transfers, reach over head, and  locomotion level  PARTICIPATION LIMITATIONS: cleaning, laundry, driving, shopping, and community activity  PERSONAL FACTORS: Age, Past/current experiences, and Time since onset of injury/illness/exacerbation are also affecting patient's functional outcome.   REHAB POTENTIAL: Good  CLINICAL DECISION MAKING: Evolving/moderate complexity  EVALUATION COMPLEXITY: Moderate  PLAN:  PT FREQUENCY: 2x/week  PT DURATION: 12 weeks  PLANNED INTERVENTIONS: Therapeutic exercises, Therapeutic activity, Neuromuscular re-education, Balance training, Gait training, Patient/Family education, Self Care, Stair training, DME instructions, Manual therapy, and Re-evaluation  PLAN FOR NEXT SESSION: Continue gait training w/ hurry cane and bilat LE strengthening exercises.     Lovie Macadamia, SPT  Delphia Grates. Fairly IV, PT, DPT Physical Therapist- [place]  [facility]  02/27/23, 1:10 PM

## 2023-03-01 ENCOUNTER — Encounter: Payer: Self-pay | Admitting: Urology

## 2023-03-01 ENCOUNTER — Telehealth: Payer: Self-pay

## 2023-03-01 ENCOUNTER — Ambulatory Visit: Payer: 59 | Admitting: Urology

## 2023-03-01 VITALS — BP 131/81 | HR 65 | Ht 61.0 in | Wt 223.0 lb

## 2023-03-01 DIAGNOSIS — R8290 Unspecified abnormal findings in urine: Secondary | ICD-10-CM

## 2023-03-01 DIAGNOSIS — N3941 Urge incontinence: Secondary | ICD-10-CM

## 2023-03-01 DIAGNOSIS — N319 Neuromuscular dysfunction of bladder, unspecified: Secondary | ICD-10-CM

## 2023-03-01 DIAGNOSIS — R399 Unspecified symptoms and signs involving the genitourinary system: Secondary | ICD-10-CM

## 2023-03-01 LAB — URINALYSIS, ROUTINE W REFLEX MICROSCOPIC
Bilirubin, UA: NEGATIVE
Glucose, UA: NEGATIVE
Ketones, UA: NEGATIVE
Nitrite, UA: POSITIVE — AB
Protein,UA: NEGATIVE
Specific Gravity, UA: <1.005 — ABNORMAL LOW (ref 1.005–1.030)
Urobilinogen, Ur: 0.2 mg/dL (ref 0.2–1.0)
pH, UA: 6 (ref 5.0–7.5)

## 2023-03-01 LAB — MICROSCOPIC EXAMINATION: WBC, UA: >30 /[HPF] — AB (ref 0–5)

## 2023-03-01 IMAGING — DX DG CHEST 2V
2 series · 2 of 2 positions shown · non-contrast
Comparison: 03/20/2020 and previous

CLINICAL DATA: Pt states that her BP shot up to over 200. She does
has a new cough, but also on a new BP medication. Denies and chest
pain or SOB. Pt is a smoker

EXAM:
CHEST - 2 VIEW

[chest pa]
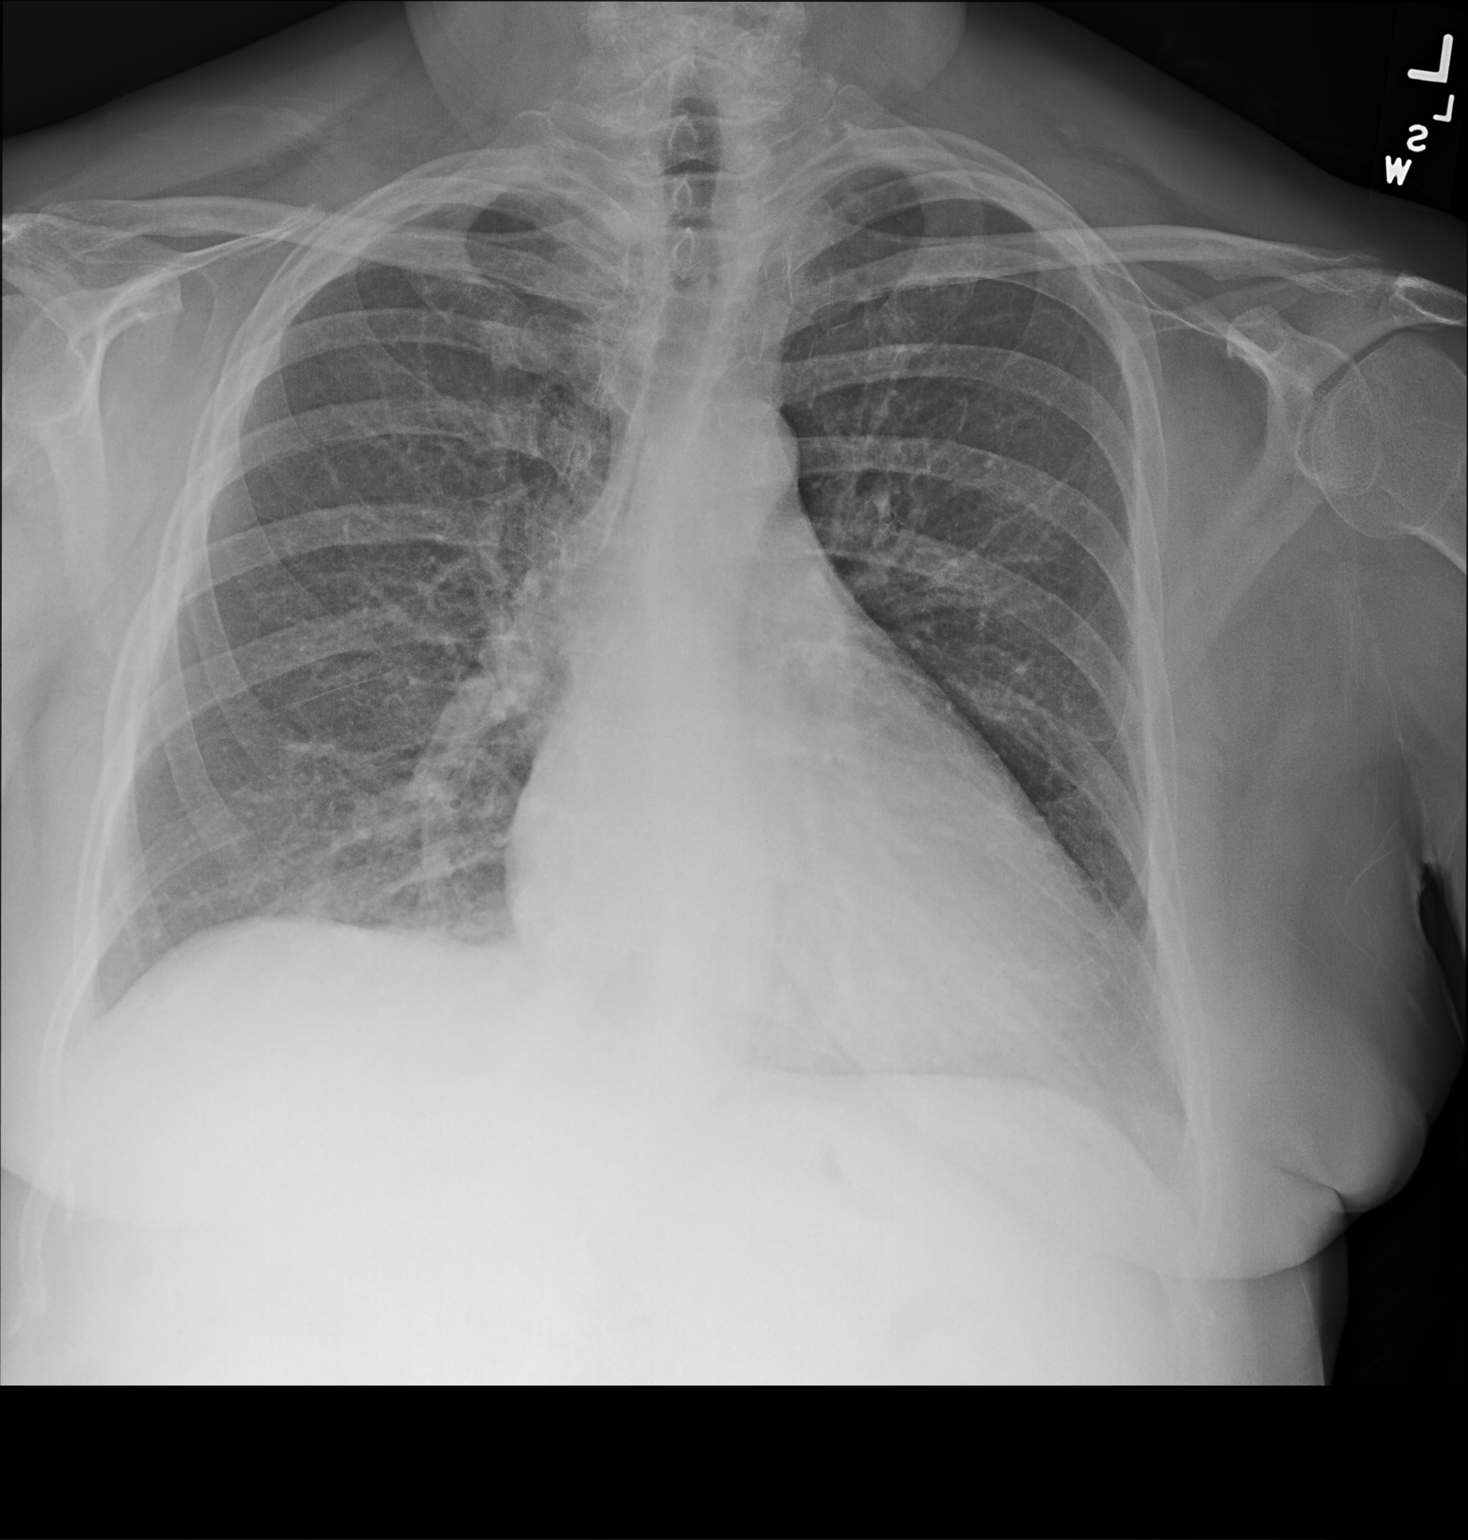

[chest lat]
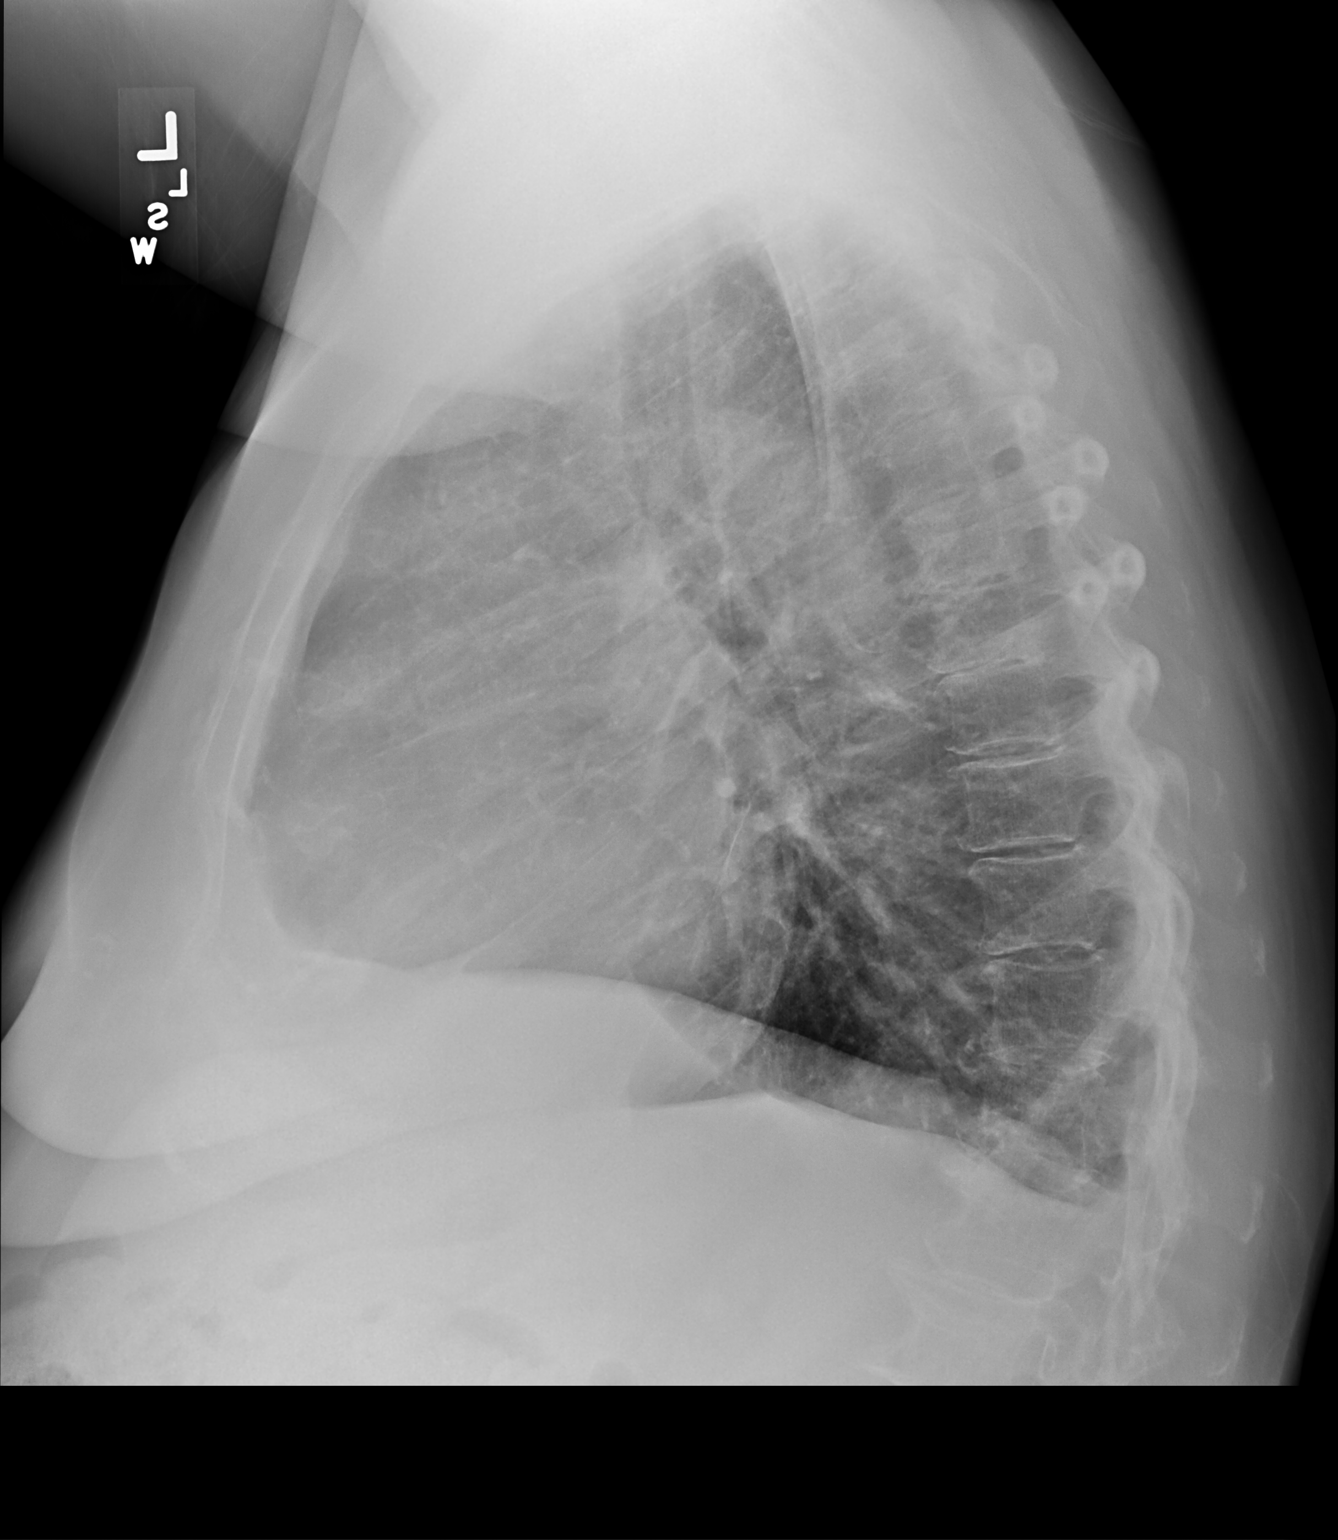

[2 of 2 positions shown; findings below may reference images not displayed]

FINDINGS: Lungs are clear.

Stable mild cardiomegaly.

No effusion.

Lower thoracic spondylitic changes with mild kyphosis.
IMPRESSION: Stable mild cardiomegaly.  No acute findings.

## 2023-03-01 MED ORDER — SULFAMETHOXAZOLE-TRIMETHOPRIM 800-160 MG PO TABS: 1.0000 | ORAL_TABLET | Freq: Two times a day (BID) | ORAL | 0 refills | Status: DC

## 2023-03-01 MED ORDER — GEMTESA 75 MG PO TABS
1.0000 | ORAL_TABLET | Freq: Every day | ORAL | 5 refills | Status: DC
Start: 2023-03-01 — End: 2023-12-22

## 2023-03-01 NOTE — Addendum Note (Signed)
Addended by: Grier Rocher on: 03/01/2023 11:15 AM   Modules accepted: Orders

## 2023-03-01 NOTE — Patient Instructions (Signed)
     Recommendations regarding UTI prevention / management:  When UTI symptoms occur: Call urology office to request order for urine culture. We recommend waiting for urine culture result prior to use of any antibiotics.  For bladder pain/ burning with urination: Over the counter Pyridium (phenazopyridine) as needed (commonly known under the "AZO" brand). No more than 3 days consecutively at a time due to risk for methemoglobinemia, liver function issues, and bone health damage with long term use of Pyridium.  Routine use for UTI prevention: Adequate fluid intake (>1.5 liters/day) to flush out the urinary tract. - Go to the bathroom to urinate every 4-6 hours while awake to minimize urinary stasis / bacterial overgrowth in the bladder. - Proanthocyanidin (PAC) supplement 36 mg daily; must be soluble (insoluble form of PAC will be ineffective). Recommended brand: Ellura. This is an over-the-counter supplement (often must be found/ purchased online) supplement derived from cranberries with concentrated active component: Proanthocyanidin (PAC) 36 mg daily. Decreases bacterial adherence to bladder lining. Not recommended for patients with interstitial cystitis due to acidity. - D-mannose powder (2 grams daily). This is an over-the-counter supplement which decreases bacterial adherence to bladder lining (it is a sugar that inhibits bacterial adherence to urothelial cells by binding to the pili of enteric bacteria). Take as per manufacturer recommendation. Can be used as an alternative or in addition to the concentrated cranberry supplement. Not recommended for diabetic patients due to sugar content. - Vitamin C supplement to acidify urine to minimize bacterial growth. Not recommended for patients with interstitial cystitis due to acidity. - Probiotic to maintain healthy vaginal microbiome to suppress bacteria at urethral opening. Brand recommendations: Feminine Balance (highest concentration of  lactobacillus) or Hyperbiotic Pro 15.  Note for patients with diabetes: You may read about D-mannose powder for UTI prevention. That is an over the-counter supplement which decreases bacterial adherence to bladder lining. I would NOT advise that for you as a person with diabetes due to its sugar content.

## 2023-03-01 NOTE — Telephone Encounter (Signed)
-----   Message from Susan [name] sent at 03/01/2023 10:51 AM EDT ----- Forgot to discuss impact of caffeine intake on bladder today re: urinary urgency & urge incontinence. Please advise patient to minimize caffeine intake as that can exacerbate those symptoms. Thank you.

## 2023-03-01 NOTE — Telephone Encounter (Signed)
Unable to reach patient by phone or leave vm. Mychart message sent informing patient of NP's response.

## 2023-03-01 NOTE — Progress Notes (Signed)
post void residual=41

## 2023-03-03 ENCOUNTER — Ambulatory Visit: Payer: 59

## 2023-03-03 DIAGNOSIS — M6281 Muscle weakness (generalized): Secondary | ICD-10-CM | POA: Diagnosis not present

## 2023-03-03 DIAGNOSIS — R262 Difficulty in walking, not elsewhere classified: Secondary | ICD-10-CM

## 2023-03-03 IMAGING — US US CAROTID DUPLEX BILAT
1 series · 13 of 24 positions shown · non-contrast
Comparison: None.

CLINICAL DATA: Left carotid bruit

EXAM:
BILATERAL CAROTID DUPLEX ULTRASOUND
TECHNIQUE: Gray scale imaging, color Doppler and duplex ultrasound were
performed of bilateral carotid and vertebral arteries in the neck.

[Series 1: us carotid duplex bilat · 0.06mm/px · 13 of 63 slices shown]
[im 1/63]
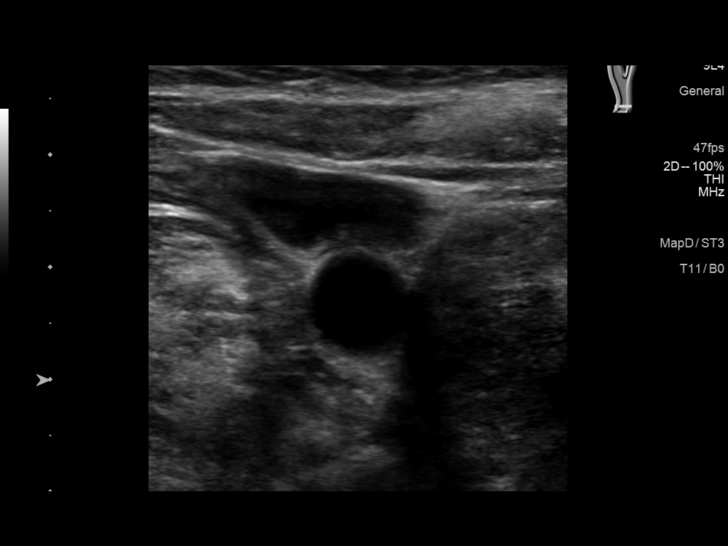
[im 6/63]
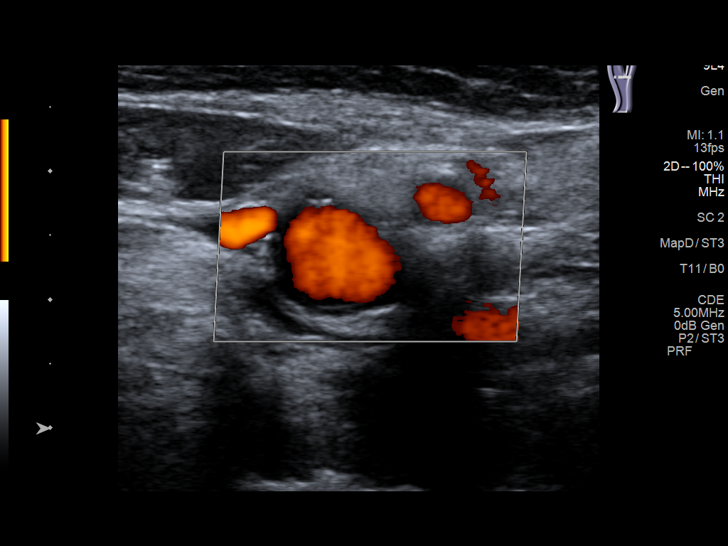
[im 11/63]
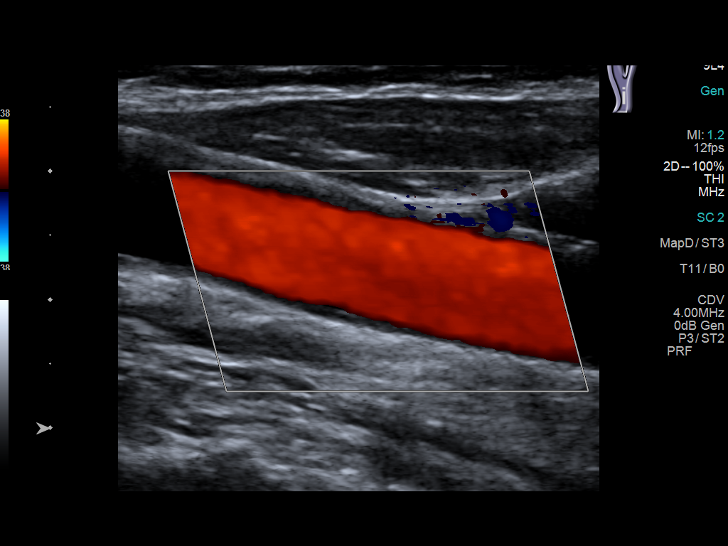
[im 17/63]
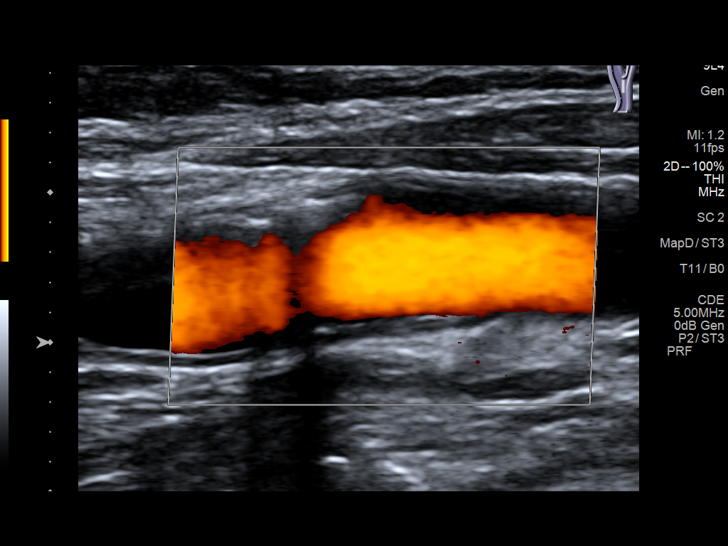
[im 22/63]
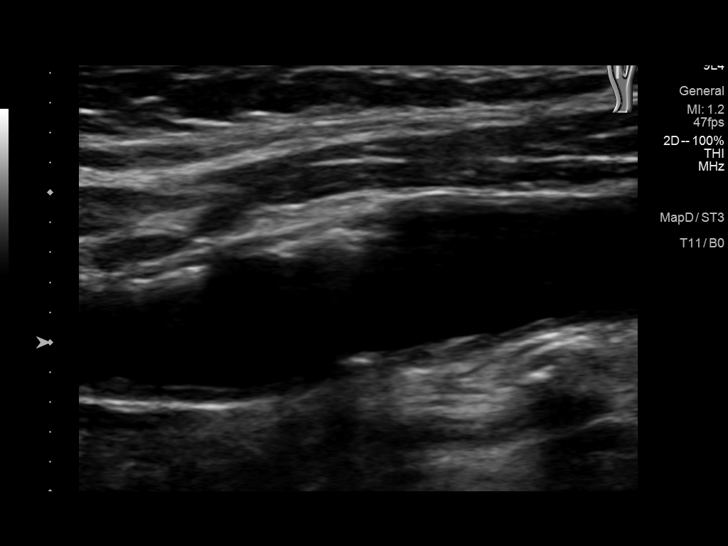
[im 27/63]
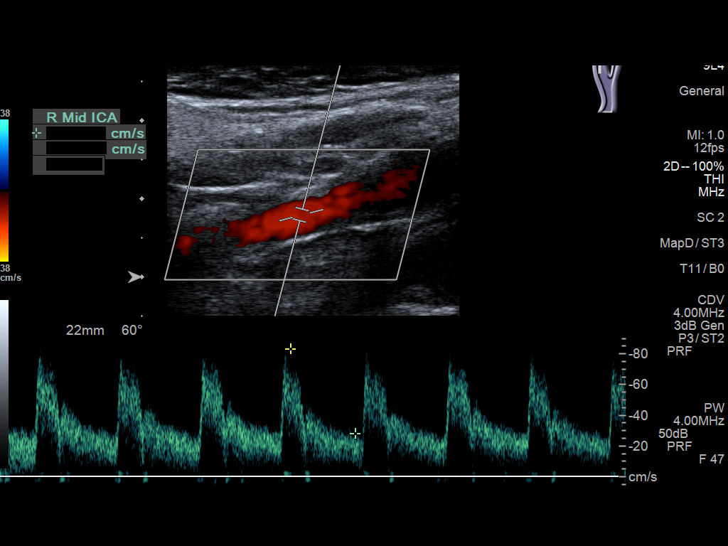
[im 33/63]
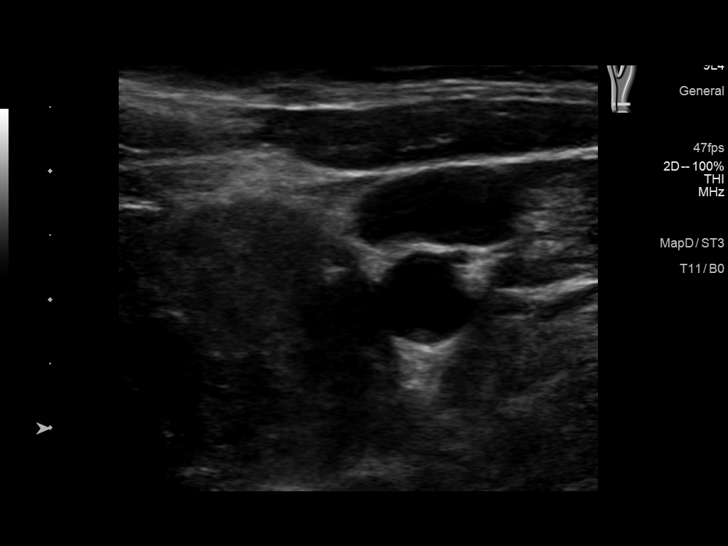
[im 36/63]
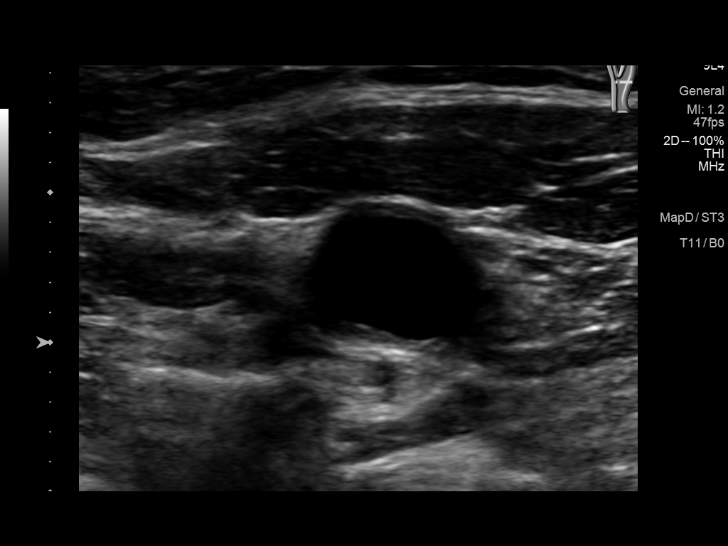
[im 41/63]
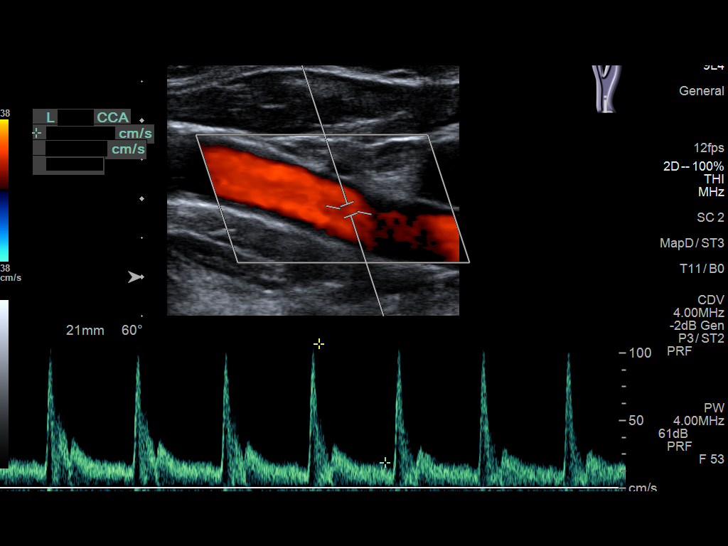
[im 46/63]
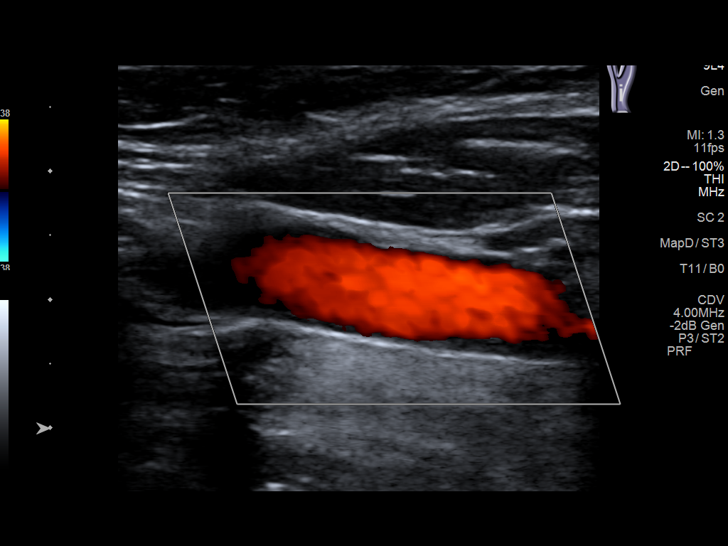
[im 52/63]
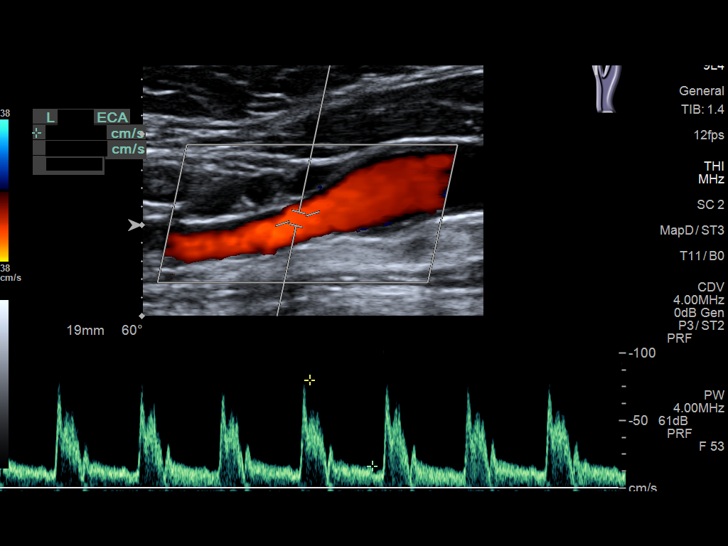
[im 57/63]
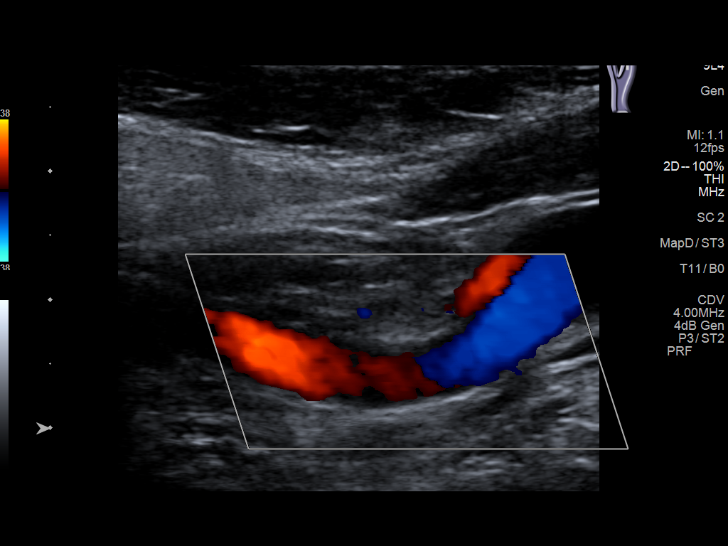
[im 63/63]
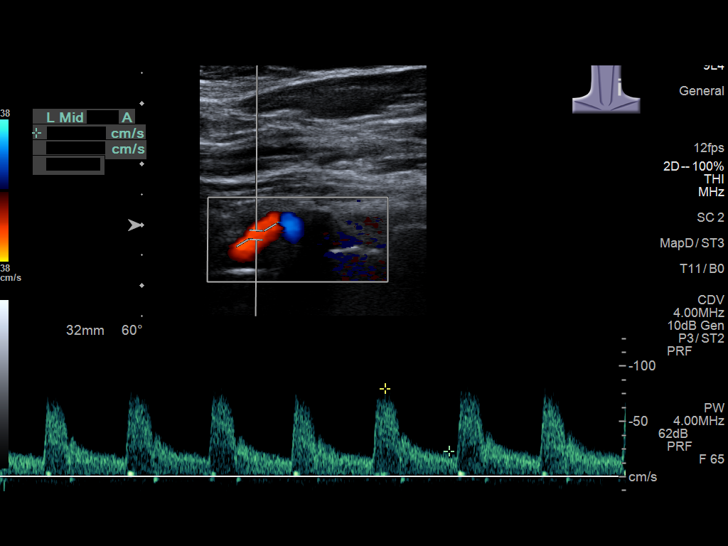

[13 of 24 positions shown; findings below may reference images not displayed]

FINDINGS: Criteria: Quantification of carotid stenosis is based on velocity
parameters that correlate the residual internal carotid diameter
with NASCET-based stenosis levels, using the diameter of the distal
internal carotid lumen as the denominator for stenosis measurement.

The following velocity measurements were obtained:

RIGHT

ICA: 101/36 cm/sec

CCA: 57/17 cm/sec

SYSTOLIC ICA/CCA RATIO:

ECA: 111 cm/sec

LEFT

ICA: 81/28 cm/sec

CCA: 85/21 cm/sec

SYSTOLIC ICA/CCA RATIO:

ECA: 80 cm/sec

RIGHT CAROTID ARTERY: Minor echogenic shadowing plaque formation. No
hemodynamically significant right ICA stenosis, velocity elevation,
or turbulent flow. Degree of narrowing less than 50%.

RIGHT VERTEBRAL ARTERY:  Normal antegrade flow

LEFT CAROTID ARTERY: Similar scattered minor echogenic plaque
formation. No hemodynamically significant left ICA stenosis,
velocity elevation, or turbulent flow.

LEFT VERTEBRAL ARTERY:  Normal antegrade flow
IMPRESSION: Minor carotid atherosclerosis. Negative for stenosis. Degree of
narrowing less than 50% bilaterally by ultrasound criteria.

Patent antegrade vertebral flow bilaterally

## 2023-03-03 NOTE — Therapy (Signed)
OUTPATIENT PHYSICAL THERAPY NEURO TREATMENT  Patient Name: Susan [name] MRN: [id number] DOB:03/09/1961, 62 y.o., female Today's Date: 03/03/2023   PCP: [name] MD REFERRING PROVIDER: Venetia Night MD  END OF SESSION:  PT End of Session - 03/03/23 1029     Visit Number 15    Number of Visits 25    Date for PT Re-Evaluation 03/07/23    PT Start Time 1031    PT Stop Time 1114    PT Time Calculation (min) 43 min    Equipment Utilized During Treatment Gait belt    Activity Tolerance Patient tolerated treatment well    Behavior During Therapy WFL for tasks assessed/performed             Past Medical History:  Diagnosis Date   ADD (attention deficit disorder)    Alcohol abuse    Anemia    Anxiety    Aortic atherosclerosis (HCC) 02/20/2018   Chest CT Sept 2019   Arthritis    rheumatoid arthritis   Asthma    Bipolar disorder (HCC)    Centrilobular emphysema (HCC)    Cocaine use disorder, moderate, in sustained remission (HCC)    Constipation    Degenerative disc disease at L5-S1 level 09/28/2016   See ortho note May 2018   Depression    bipolar, hx of suicide attempt   Diabetes mellitus, type 2 (HCC)    Diverticulitis 2013   DOE (dyspnea on exertion)    Drug use    Family history of adverse reaction to anesthesia    mom-delayed emergence   GERD (gastroesophageal reflux disease)    H/O suicide attempt    slit wrists   Hepatitis C 06/26/2014   treated with Harvoni   Hip pain    History of echocardiogram    a. 04/2019 Echo: EF >65%, nl RV fxn.   History of MRSA infection 2013   HLD (hyperlipidemia)    Hypertension    a. 06/2021 Renal Duplex: ? bilat RAS; b. 06/2021 CTA Abd: No signif RAS.   Hyponatremia    Hypothyroidism    Incisional hernia 11/08/2012   Knee pain    Left carotid bruit    Morbid obesity (HCC)    Multinodular thyroid    OSA (obstructive sleep apnea)    OSA on CPAP    Osteoporosis    Palpitations    Post-traumatic  osteoarthritis of right knee 09/24/2015   Recurrent ventral hernia 11/08/2012   Status post total right knee replacement using cement 05/10/2016   Stroke (HCC) 07/05/2022   right arm,torso and right leg numbness   Tobacco use disorder    Vitamin B12 deficiency    Vitamin D deficiency disease    Past Surgical History:  Procedure Laterality Date   BILATERAL SALPINGOOPHORECTOMY     due to abnormal mass   BREAST BIOPSY Left    neg   BREAST SURGERY Left 20 yrs ago   CESAREAN SECTION     COLONOSCOPY     COLONOSCOPY WITH PROPOFOL N/A 10/16/2017   Procedure: COLONOSCOPY WITH PROPOFOL;  Surgeon: Wyline Mood, MD;  Location: [facility] END[facility]COPY;  Service: Gastroenterology;  Laterality: N/A;   COLONOSCOPY WITH PROPOFOL N/A 02/16/2021   Procedure: COLONOSCOPY WITH PROPOFOL;  Surgeon: Wyline Mood, MD;  Location: [facility] ENDOSCOPY;  Service: Gastroenterology;  Laterality: N/A;   FORAMINOTOMY 1 LEVEL Right 10/10/2022   Procedure: RIGHT L4-5 LAMINOFORAMINOTOMY;  Surgeon: Venetia Night, MD;  Location: [facility] ORS;  Service: Neurosurgery;  Laterality: Right;   HERNIA  REPAIR  06/2011, July 2014   Ventral wall repair with Physiomesh   HERNIA REPAIR     2nd.vental wall repair   JOINT REPLACEMENT Right    knee   LUMBAR LAMINECTOMY/ DECOMPRESSION WITH MET-RX N/A 10/12/2022   Procedure: LUMBAR LAMINECTOMY/ DECOMPRESSION WITH MET-RX;  Surgeon: Venetia Night, MD;  Location: [facility] ORS;  Service: Neurosurgery;  Laterality: N/A;   TONSILLECTOMY     TOTAL HIP ARTHROPLASTY Left 07/23/2019   Procedure: TOTAL HIP ARTHROPLASTY;  Surgeon: Christena Flake, MD;  Location: [facility] ORS;  Service: Orthopedics;  Laterality: Left;   TOTAL KNEE ARTHROPLASTY Right 05/10/2016   Procedure: TOTAL KNEE ARTHROPLASTY;  Surgeon: Christena Flake, MD;  Location: [facility] ORS;  Service: Orthopedics;  Laterality: Right;   TUBAL LIGATION     Patient Active Problem List   Diagnosis Date Noted   Neurogenic bowel 02/13/2023   Neurogenic bladder 02/13/2023    Abnormality of gait and mobility 02/13/2023   Blister of foot without infection, right, initial encounter 01/05/2023   Bipolar 1 disorder, depressed, moderate (HCC) 11/29/2022   Anemia 10/13/2022   Cauda equina syndrome (HCC) 10/12/2022   Epidural hematoma (HCC) 10/12/2022   Mass in epidural space 10/12/2022   Synovial cyst of lumbar facet joint 10/10/2022   Lumbar radiculopathy 10/10/2022   S/P laminectomy 10/10/2022   Epigastric pain 07/22/2022   Enlarged thyroid 07/22/2022   Chronic diarrhea 07/22/2022   CVA (cerebrovascular accident) (HCC) 07/05/2022   Skin lesion 06/28/2022   BMI 38.0-38.9,adult 07/14/2021   Type 2 diabetes mellitus without complication, without long-term current use of insulin (HCC) 06/16/2021   H/O thyroid nodule 05/31/2021   History of hepatitis C- treated 2014- 2015 with [facility] and followed.  05/19/2021   Left carotid bruit 04/28/2021   Anxiety 05/28/2020   Bipolar affective disorder in remission (HCC) 05/28/2020   Bipolar disorder, in full remission, most recent episode mixed (HCC) 03/06/2020   Need for immunization against influenza 01/27/2020   Morbid obesity (HCC) 01/27/2020   Osteoporosis 11/06/2019   Bipolar 1 disorder, mixed, mild (HCC) 10/29/2019   PTSD (post-traumatic stress disorder) 09/27/2019   Bereavement 09/27/2019   Cocaine use disorder, moderate, in sustained remission (HCC) 09/27/2019   Status post total hip replacement, left 07/23/2019   Primary osteoarthritis of left hip 06/14/2019   Depression 10/23/2018   Allergic rhinitis 08/23/2018   Aortic atherosclerosis (HCC) 02/20/2018   Centrilobular emphysema (HCC) 02/20/2018   Hyperlipidemia 03/28/2017   Degenerative disc disease at L5-S1 level 09/28/2016   Tobacco use disorder 04/21/2016   GERD (gastroesophageal reflux disease) 12/17/2015   Chronic back pain 10/09/2015   Post-traumatic osteoarthritis of right knee 09/24/2015   Alcohol use disorder, severe, in sustained  remission (HCC) 04/13/2015   Vitamin D deficiency 04/09/2015   Vitamin B12 deficiency 04/09/2015   OSA on CPAP 04/09/2015   Subclinical hyperthyroidism 06/26/2014   Goiter colloid, toxic, nodular 06/26/2014   Hypertension 06/26/2014   Tobacco use disorder, continuous 06/26/2014   Bipolar I disorder (HCC) 06/26/2014   Lumbar radiculitis 10/03/2013   Diverticulosis 08/24/2013    ONSET DATE: 10/12/22  REFERRING DIAG: Z61.096 (ICD-10-CM) - Status post lumbar spine surgery for decompression of spinal cord G83.4 (ICD-10-CM) - Cauda equina syndrome (HCC)  THERAPY DIAG:  Muscle weakness (generalized)  Difficulty in walking, not elsewhere classified  Rationale for Evaluation and Treatment: Rehabilitation  SUBJECTIVE:  SUBJECTIVE STATEMENT:  Pt reports 6/10 LBP at today's session. Pt notes she will receive her over pronation orthotics on 10/30.   Pt accompanied by: self  PERTINENT HISTORY:  Metra Most is a 62 y/o F with PMH: HTN, DM2, HLD, ETOH use, THA, PTSD, cocaine, bipolar, and L thalamic infarct (06/2022), s/p lumbar laminoforaminotomy (10/10/22). Presenting post L3-5 decompression with evacuation of epidural hematoma on 10/12/22 for Cauda Equina Syndrome. Pt has residual RLE deficits after her lumbar surgeries leading to ambulating with RW. She was independent prior to her surgeries now reporting balance and gait difficulties. Has had some Worsening RUE N/T since her CVA in February after her lumbar surgeries. Reports N/T 6-7/10 on severity. Mentions some falls when not using her RW. Feels like she always has to have a hand on something to steady herself.  Pt has residual RLE deficits after her lumbar surgeries leading to ambulating with RW. She was independent prior to her surgeries now reporting balance  and gait difficulties. Has had some Worsening RUE N/T since her CVA in February after her lumbar surgeries. Reports N/T 6-7/10 on severity. Mentions some falls when not using her RW. Feels like she always has to have a hand on something to steady herself. Has had some falls anterior standing too close in the RW, a fall walking up the stairs with the RW. She is able to get herself up. Pt describing some unsafe scenarios with poor RW use with transfers and asc/desc curbs.   PAIN:  Are you having pain? No  PRECAUTIONS: Back  RED FLAGS: Bowel or bladder incontinence: Yes: MD aware since surgery.     WEIGHT BEARING RESTRICTIONS: No  FALLS: Has patient fallen in last 6 months? Yes. Number of falls 3  LIVING ENVIRONMENT: Lives with: lives with their son   PLOF: Independent  PATIENT GOALS: Improve her walking and   OBJECTIVE:   DIAGNOSTIC FINDINGS: CLINICAL DATA:  Elective surgery   EXAM: LUMBAR SPINE - 2-3 VIEW   COMPARISON:  10/10/2022   FINDINGS: Intraoperative fluoroscopy is utilized for surgical control purposes. Fluoroscopy time is recorded at 6 seconds. Dose 5.33 mGy. Two spot fluoroscopic images are obtained. Initial image demonstrates a localization marker posterior to the L4 vertebra. Second image demonstrates a localization marker posterior to the L3-4 interspace level.   IMPRESSION: Intraoperative fluoroscopy utilized for surgical control purposes for lumbar spine localization.     Electronically Signed   By: Burman Nieves M.D.   On: 10/12/2022 20:24  COGNITION: Overall cognitive status: Within functional limits for tasks assessed   SENSATION: Light touch: Impaired  RLE: impaired L2, L4-L5  COORDINATION: Heel to shin normal bilaterally   PROPRIOCEPTION: Normal bilat   POSTURE:  Heavy BUE support on RW with standing tasks with bilateral upper trap use   LUMBAR AROM: Flexion: 75% limited  Extension: 50% limited  Rotation R/L: 50% limited   Lateral flexion R/L: 50% limited  LOWER EXTREMITY AROM:  L ankle inversion: 20, eversion: 35  R ankle inversion: 15 , eversion: 25    LOWER EXTREMITY MMT:    MMT Right Eval Left Eval  Hip flexion 4- 4  Hip extension    Hip abduction    Hip adduction    Hip internal rotation    Hip external rotation    Knee flexion 4- 4+  Knee extension 4 4+  Ankle dorsiflexion 4- 4+  Ankle plantarflexion    Ankle inversion 3 4+  Ankle eversion 3 4-  (Blank rows = not  tested)  TRANSFERS: Assistive device utilized: Environmental consultant - 2 wheeled  Sit to stand: Modified independence Stand to sit: Modified independence  *poor hand placement keeping BUE's on RW for STS and stand to sit despite VC's and education for safe hand placement.   GAIT: Gait pattern: step through pattern, decreased stance time- Right, decreased ankle dorsiflexion- Right, and poor foot clearance- Right. Notable R ankle inversion in stance phase due to evertor weakness Distance walked: 10 meters Assistive device utilized: Walker - 2 wheeled Level of assistance: Modified independence Comments: Heavy BUE support on RW. PT adjusting RW height to reduced B shoulder shrug.   FUNCTIONAL TESTS:  5 times sit to stand: 29.37 seconds Timed up and go (TUG): 26.89 seconds 10 meter walk test: 0.48 m/s with RW  PATIENT SURVEYS:  FOTO 37 with target score of 43  TODAY'S TREATMENT: DATE: 03/03/23  There-ex:  Nustep x5 minutes to increase LE strengthening and lumbar mobility lvl 3.0 seat 7  Mini squats at elevated plinth table 2 x10 w/ 2KG medicine ball  Seated heel raises w/ 6# DB at distal thighs of bilat LE's 2 x10 Standing marches w/ 2# AW on bilat UE support 2 x  Seated L ankle inversion with GTB: 3x12  Neuro re-ed:  STS to ambulation CGA with hurry cane in RUE 2 x25 down and back continuously. Progressed with 2# AW's on ankles for strengthening. VC's for progressing to step through pattern. Good foot clearance  bilaterally but displays minimal step through pattern due to added weight.   2x6" hurdle clearance with 2# AW's donned. Hurrycane in RUE and HHA on LLE. Required PT demo and min VC's for foot clearance and navigation due to added weight. CGA.     PATIENT EDUCATION: Education details: HEP, safe use of AD, POC Person educated: Patient Education method: Explanation, Demonstration, Verbal cues, and Handouts Education comprehension: verbalized understanding and needs further education  HOME EXERCISE PROGRAM: Access Code: [email or web address] URL: [email or web address] Date: 12/13/2022 Prepared by: Ronnie Derby  Exercises - Supine Bridge  - 1 x daily - 7 x weekly - 3 sets - 6 reps - Seated Scapular Retraction  - 1 x daily - 7 x weekly - 3 sets - 15 reps - Seated Ankle Circles  - 1 x daily - 7 x weekly - 3 sets - 15 reps  GOALS: Goals reviewed with patient? No  SHORT TERM GOALS: Target date: 12/24/22  Pt will be independent with HEP to improve RLE strength for transfers and gait Baseline: 12/13/22: Provided initial HEP 02/02/23: HEP compliant Goal status: MET  2.  Pt will demonstrate safe use of hand placement with transfers with LRAD to demonstrate reduced falls risk. Baseline: 12/13/22: BUE use on AD with STS and stand to sit transfers 02/02/23: Correct hand placement noted  Goal status: MET  LONG TERM GOALS: Target date: 03/07/23  Pt will improve FOTO to target score to demonstrate clinically significant improvement in functional mobility.  Baseline: 12/13/22: 37/43 02/02/23: 43/43 Goal status: MET  2.  Pt will complete TUG in 12 seconds or less with LRAD to demonstrate reduced falls risk with transfers and short distance gait Baseline: 12/13/22: 26.89 seconds w/ RW, 01/24/23: 44.20 seconds w/ 3 pronged hurry cane 02/02/23: 31.05sec with hurry cane Goal status: ONGOING  3.  Pt will improve 5xSTS to 11.4 seconds or less without UE support to demonstrate improved LE strength to match age  based norms.  Baseline: : 12/13/22: 29.37 seconds 02/02/23: 20.20 seconds Goal status: ONGOING  4.  Pt will improve FGA by at least 4 points to demonstrate clinically significant improvement in balance with community activities.  Baseline: 12/13/22: deferred to next session; 12/29/22: 13/30; 02/02/23:14/30 Goal status: ONGOING  5.  Pt will improve gait speed to at least 1.0 m/s with LRAD to demonstrate safe ability to complete short community distances. Baseline: 12/13/22: 0.48 m/s with RW;  02/02/23: 0.32 m/s w/ hurry cane Goal status: ONGOING  6. Pt will increase 6 MWT by > 165' to display improvements in functional endurance with community ambulation.   Baseline: 01/12/23: 429 ft w/ 7/10 NPS 02/02/23: Defer to next session   Goal status: ONGOING   ASSESSMENT:  CLINICAL IMPRESSION: Continuing POC with progression of gait and LE strengthening tasks. Pt tolerating obstacle navigation and foot clearance activities with ankle weights. Pt does demonstrate difficulty clearing hurdles due to additional resistance likely from hip flexor weakness. Pt does continue to demonstrate excellent 2 point gait pattern with cane. Encouraged pt on regular performance of ankle inversion strengthening in attempt to correct calcaneal eversion due to lumbar spine pathology. Continue to recommend use of RW for community tasks for safety and reduced falls risks. Pt would continue to benefit from skilled PT interventions to address remaining deficits.    OBJECTIVE IMPAIRMENTS: Abnormal gait, decreased balance, decreased mobility, difficulty walking, decreased ROM, decreased strength, impaired sensation, and postural dysfunction.   ACTIVITY LIMITATIONS: carrying, lifting, bending, standing, stairs, transfers, reach over head, and locomotion level  PARTICIPATION LIMITATIONS: cleaning, laundry, driving, shopping, and community activity  PERSONAL FACTORS: Age, Past/current experiences, and Time since onset of  injury/illness/exacerbation are also affecting patient's functional outcome.   REHAB POTENTIAL: Good  CLINICAL DECISION MAKING: Evolving/moderate complexity  EVALUATION COMPLEXITY: Moderate  PLAN:  PT FREQUENCY: 2x/week  PT DURATION: 12 weeks  PLANNED INTERVENTIONS: Therapeutic exercises, Therapeutic activity, Neuromuscular re-education, Balance training, Gait training, Patient/Family education, Self Care, Stair training, DME instructions, Manual therapy, and Re-evaluation  PLAN FOR NEXT SESSION: Continue gait training w/ hurry cane and bilat LE strengthening exercises.     Lovie Macadamia, SPT  Delphia Grates. Fairly IV, PT, DPT Physical Therapist- [facility]  03/03/23, 12:13 PM

## 2023-03-04 LAB — URINE CULTURE

## 2023-03-07 ENCOUNTER — Ambulatory Visit: Payer: 59

## 2023-03-07 ENCOUNTER — Telehealth: Payer: Self-pay

## 2023-03-07 ENCOUNTER — Other Ambulatory Visit: Payer: Self-pay

## 2023-03-07 ENCOUNTER — Ambulatory Visit: Payer: 59 | Admitting: Gastroenterology

## 2023-03-07 DIAGNOSIS — M6281 Muscle weakness (generalized): Secondary | ICD-10-CM

## 2023-03-07 DIAGNOSIS — R262 Difficulty in walking, not elsewhere classified: Secondary | ICD-10-CM | POA: Diagnosis not present

## 2023-03-07 NOTE — Therapy (Addendum)
OUTPATIENT PHYSICAL THERAPY NEURO RECERTIFICATION  Patient Name: Susan [name] MRN: [id number] DOB:June 25, 1960, 62 y.o., female Today's Date: 03/07/2023   PCP: [name] MD REFERRING PROVIDER: Venetia Night MD  END OF SESSION:  PT End of Session - 03/07/23 1116     Visit Number 16    Number of Visits 25    Date for PT Re-Evaluation 04/18/23    PT Start Time 1116    PT Stop Time 1158    PT Time Calculation (min) 42 min    Equipment Utilized During Treatment Gait belt    Activity Tolerance Patient tolerated treatment well    Behavior During Therapy WFL for tasks assessed/performed             Past Medical History:  Diagnosis Date   ADD (attention deficit disorder)    Alcohol abuse    Anemia    Anxiety    Aortic atherosclerosis (HCC) 02/20/2018   Chest CT Sept 2019   Arthritis    rheumatoid arthritis   Asthma    Bipolar disorder (HCC)    Centrilobular emphysema (HCC)    Cocaine use disorder, moderate, in sustained remission (HCC)    Constipation    Degenerative disc disease at L5-S1 level 09/28/2016   See ortho note May 2018   Depression    bipolar, hx of suicide attempt   Diabetes mellitus, type 2 (HCC)    Diverticulitis 2013   DOE (dyspnea on exertion)    Drug use    Family history of adverse reaction to anesthesia    mom-delayed emergence   GERD (gastroesophageal reflux disease)    H/O suicide attempt    slit wrists   Hepatitis C 06/26/2014   treated with Harvoni   Hip pain    History of echocardiogram    a. 04/2019 Echo: EF >65%, nl RV fxn.   History of MRSA infection 2013   HLD (hyperlipidemia)    Hypertension    a. 06/2021 Renal Duplex: ? bilat RAS; b. 06/2021 CTA Abd: No signif RAS.   Hyponatremia    Hypothyroidism    Incisional hernia 11/08/2012   Knee pain    Left carotid bruit    Morbid obesity (HCC)    Multinodular thyroid    OSA (obstructive sleep apnea)    OSA on CPAP    Osteoporosis    Palpitations    Post-traumatic  osteoarthritis of right knee 09/24/2015   Recurrent ventral hernia 11/08/2012   Status post total right knee replacement using cement 05/10/2016   Stroke (HCC) 07/05/2022   right arm,torso and right leg numbness   Tobacco use disorder    Vitamin B12 deficiency    Vitamin D deficiency disease    Past Surgical History:  Procedure Laterality Date   BILATERAL SALPINGOOPHORECTOMY     due to abnormal mass   BREAST BIOPSY Left    neg   BREAST SURGERY Left 20 yrs ago   CESAREAN SECTION     COLONOSCOPY     COLONOSCOPY WITH PROPOFOL N/A 10/16/2017   Procedure: COLONOSCOPY WITH PROPOFOL;  Surgeon: Wyline Mood, MD;  Location: [facility] ENDOSCOPY;  Service: Gastroenterology;  Laterality: N/A;   COLONOSCOPY WITH PROPOFOL N/A 02/16/2021   Procedure: COLONOSCOPY WITH PROPOFOL;  Surgeon: Wyline Mood, MD;  Location: [facility] ENDOSCOPY;  Service: Gastroenterology;  Laterality: N/A;   FORAMINOTOMY 1 LEVEL Right 10/10/2022   Procedure: RIGHT L4-5 LAMINOFORAMINOTOMY;  Surgeon: Venetia Night, MD;  Location: [facility] ORS;  Service: Neurosurgery;  Laterality: Right;   HERNIA  REPAIR  06/2011, July 2014   Ventral wall repair with Physiomesh   HERNIA REPAIR     2nd.vental wall repair   JOINT REPLACEMENT Right    knee   LUMBAR LAMINECTOMY/ DECOMPRESSION WITH MET-RX N/A 10/12/2022   Procedure: LUMBAR LAMINECTOMY/ DECOMPRESSION WITH MET-RX;  Surgeon: Venetia Night, MD;  Location: [facility] ORS;  Service: Neurosurgery;  Laterality: N/A;   TONSILLECTOMY     TOTAL HIP ARTHROPLASTY Left 07/23/2019   Procedure: TOTAL HIP ARTHROPLASTY;  Surgeon: Christena Flake, MD;  Location: [facility] ORS;  Service: Orthopedics;  Laterality: Left;   TOTAL KNEE ARTHROPLASTY Right 05/10/2016   Procedure: TOTAL KNEE ARTHROPLASTY;  Surgeon: Christena Flake, MD;  Location: [facility] ORS;  Service: Orthopedics;  Laterality: Right;   TUBAL LIGATION     Patient Active Problem List   Diagnosis Date Noted   Neurogenic bowel 02/13/2023   Neurogenic bladder 02/13/2023    Abnormality of gait and mobility 02/13/2023   Blister of foot without infection, right, initial encounter 01/05/2023   Bipolar 1 disorder, depressed, moderate (HCC) 11/29/2022   Anemia 10/13/2022   Cauda equina syndrome (HCC) 10/12/2022   Epidural hematoma (HCC) 10/12/2022   Mass in epidural space 10/12/2022   Synovial cyst of lumbar facet joint 10/10/2022   Lumbar radiculopathy 10/10/2022   S/P laminectomy 10/10/2022   Epigastric pain 07/22/2022   Enlarged thyroid 07/22/2022   Chronic diarrhea 07/22/2022   CVA (cerebrovascular accident) (HCC) 07/05/2022   Skin lesion 06/28/2022   BMI 38.0-38.9,adult 07/14/2021   Type 2 diabetes mellitus without complication, without long-term current use of insulin (HCC) 06/16/2021   H/O thyroid nodule 05/31/2021   History of hepatitis C- treated 2014- 2015 with [facility] gastroenterology and followed.  05/19/2021   Left carotid bruit 04/28/2021   Anxiety 05/28/2020   Bipolar affective disorder in remission (HCC) 05/28/2020   Bipolar disorder, in full remission, most recent episode mixed (HCC) 03/06/2020   Need for immunization against influenza 01/27/2020   Morbid obesity (HCC) 01/27/2020   Osteoporosis 11/06/2019   Bipolar 1 disorder, mixed, mild (HCC) 10/29/2019   PTSD (post-traumatic stress disorder) 09/27/2019   Bereavement 09/27/2019   Cocaine use disorder, moderate, in sustained remission (HCC) 09/27/2019   Status post total hip replacement, left 07/23/2019   Primary osteoarthritis of left hip 06/14/2019   Depression 10/23/2018   Allergic rhinitis 08/23/2018   Aortic atherosclerosis (HCC) 02/20/2018   Centrilobular emphysema (HCC) 02/20/2018   Hyperlipidemia 03/28/2017   Degenerative disc disease at L5-S1 level 09/28/2016   Tobacco use disorder 04/21/2016   GERD (gastroesophageal reflux disease) 12/17/2015   Chronic back pain 10/09/2015   Post-traumatic osteoarthritis of right knee 09/24/2015   Alcohol use disorder, severe, in sustained  remission (HCC) 04/13/2015   Vitamin D deficiency 04/09/2015   Vitamin B12 deficiency 04/09/2015   OSA on CPAP 04/09/2015   Subclinical hyperthyroidism 06/26/2014   Goiter colloid, toxic, nodular 06/26/2014   Hypertension 06/26/2014   Tobacco use disorder, continuous 06/26/2014   Bipolar I disorder (HCC) 06/26/2014   Lumbar radiculitis 10/03/2013   Diverticulosis 08/24/2013    ONSET DATE: 10/12/22  REFERRING DIAG: C16.606 (ICD-10-CM) - Status post lumbar spine surgery for decompression of spinal cord G83.4 (ICD-10-CM) - Cauda equina syndrome (HCC)  THERAPY DIAG:  Difficulty in walking, not elsewhere classified  Muscle weakness (generalized)  Rationale for Evaluation and Treatment: Rehabilitation  SUBJECTIVE:  SUBJECTIVE STATEMENT:  Pt reports 5/10 LBP at today's session. Pt has received her over-pronation orthotics but has not noticed significant improvement.   Pt accompanied by: self  PERTINENT HISTORY:  Anzlee Schreiter is a 62 y/o F with PMH: HTN, DM2, HLD, ETOH use, THA, PTSD, cocaine, bipolar, and L thalamic infarct (06/2022), s/p lumbar laminoforaminotomy (10/10/22). Presenting post L3-5 decompression with evacuation of epidural hematoma on 10/12/22 for Cauda Equina Syndrome. Pt has residual RLE deficits after her lumbar surgeries leading to ambulating with RW. She was independent prior to her surgeries now reporting balance and gait difficulties. Has had some Worsening RUE N/T since her CVA in February after her lumbar surgeries. Reports N/T 6-7/10 on severity. Mentions some falls when not using her RW. Feels like she always has to have a hand on something to steady herself.  Pt has residual RLE deficits after her lumbar surgeries leading to ambulating with RW. She was independent prior to her  surgeries now reporting balance and gait difficulties. Has had some Worsening RUE N/T since her CVA in February after her lumbar surgeries. Reports N/T 6-7/10 on severity. Mentions some falls when not using her RW. Feels like she always has to have a hand on something to steady herself. Has had some falls anterior standing too close in the RW, a fall walking up the stairs with the RW. She is able to get herself up. Pt describing some unsafe scenarios with poor RW use with transfers and asc/desc curbs.   PAIN:  Are you having pain? No  PRECAUTIONS: Back  RED FLAGS: Bowel or bladder incontinence: Yes: MD aware since surgery.     WEIGHT BEARING RESTRICTIONS: No  FALLS: Has patient fallen in last 6 months? Yes. Number of falls 3  LIVING ENVIRONMENT: Lives with: lives with their son   PLOF: Independent  PATIENT GOALS: Improve her walking and   OBJECTIVE:   DIAGNOSTIC FINDINGS: CLINICAL DATA:  Elective surgery   EXAM: LUMBAR SPINE - 2-3 VIEW   COMPARISON:  10/10/2022   FINDINGS: Intraoperative fluoroscopy is utilized for surgical control purposes. Fluoroscopy time is recorded at 6 seconds. Dose 5.33 mGy. Two spot fluoroscopic images are obtained. Initial image demonstrates a localization marker posterior to the L4 vertebra. Second image demonstrates a localization marker posterior to the L3-4 interspace level.   IMPRESSION: Intraoperative fluoroscopy utilized for surgical control purposes for lumbar spine localization.     Electronically Signed   By: Burman Nieves M.D.   On: 10/12/2022 20:24  COGNITION: Overall cognitive status: Within functional limits for tasks assessed   SENSATION: Light touch: Impaired  RLE: impaired L2, L4-L5  COORDINATION: Heel to shin normal bilaterally   PROPRIOCEPTION: Normal bilat   POSTURE:  Heavy BUE support on RW with standing tasks with bilateral upper trap use   LUMBAR AROM: Flexion: 75% limited  Extension: 50% limited   Rotation R/L: 50% limited  Lateral flexion R/L: 50% limited  LOWER EXTREMITY AROM:  L ankle inversion: 20, eversion: 35  R ankle inversion: 15 , eversion: 25    LOWER EXTREMITY MMT:    MMT Right Eval Left Eval  Hip flexion 4- 4  Hip extension    Hip abduction    Hip adduction    Hip internal rotation    Hip external rotation    Knee flexion 4- 4+  Knee extension 4 4+  Ankle dorsiflexion 4- 4+  Ankle plantarflexion    Ankle inversion 3 4+  Ankle eversion 3 4-  (Blank rows =  not tested)  TRANSFERS: Assistive device utilized: Environmental consultant - 2 wheeled  Sit to stand: Modified independence Stand to sit: Modified independence  *poor hand placement keeping BUE's on RW for STS and stand to sit despite VC's and education for safe hand placement.   GAIT: Gait pattern: step through pattern, decreased stance time- Right, decreased ankle dorsiflexion- Right, and poor foot clearance- Right. Notable R ankle inversion in stance phase due to evertor weakness Distance walked: 10 meters Assistive device utilized: Walker - 2 wheeled Level of assistance: Modified independence Comments: Heavy BUE support on RW. PT adjusting RW height to reduced B shoulder shrug.   FUNCTIONAL TESTS:  5 times sit to stand: 29.37 seconds Timed up and go (TUG): 26.89 seconds 10 meter walk test: 0.48 m/s with RW  PATIENT SURVEYS:  FOTO 37 with target score of 43  TODAY'S TREATMENT: DATE: 03/07/23  Session spent reassessing pt's goals and POC to complete re-cert. (See below)    PATIENT EDUCATION: Education details: HEP, safe use of AD, POC Person educated: Patient Education method: Explanation, Demonstration, Verbal cues, and Handouts Education comprehension: verbalized understanding and needs further education  HOME EXERCISE PROGRAM: Access Code: [email or web address] URL: [name] Date: 01/24/2023 Prepared by: Ronnie Derby  Exercises - Seated Scapular Retraction  - 1 x daily - 7  x weekly - 3 sets - 15 reps - Seated Ankle Circles  - 1 x daily - 7 x weekly - 3 sets - 15 reps - Seated Heel Toe Raises  - 1 x daily - 4-5 x weekly - 3 sets - 10 reps - Sit to Stand with Counter Support  - 1 x daily - 4-5 x weekly - 3 sets - 8 reps   Access Code: [email or web address] URL: [email or web address] Date: 12/13/2022 Prepared by: Ronnie Derby  Exercises - Supine Bridge  - 1 x daily - 7 x weekly - 3 sets - 6 reps - Seated Scapular Retraction  - 1 x daily - 7 x weekly - 3 sets - 15 reps - Seated Ankle Circles  - 1 x daily - 7 x weekly - 3 sets - 15 reps  GOALS: Goals reviewed with patient? No  SHORT TERM GOALS: Target date: 12/24/22  Pt will be independent with HEP to improve RLE strength for transfers and gait Baseline: 12/13/22: Provided initial HEP 02/02/23: HEP compliant Goal status: MET  2.  Pt will demonstrate safe use of hand placement with transfers with LRAD to demonstrate reduced falls risk. Baseline: 12/13/22: BUE use on AD with STS and stand to sit transfers 02/02/23: Correct hand placement noted  Goal status: MET  LONG TERM GOALS: Target date: 04/18/23  Pt will improve FOTO to target score to demonstrate clinically significant improvement in functional mobility.  Baseline: 12/13/22: 37/43; 02/02/23: 43/43 03/07/23 36/43 (answered questions based on not using RW) Goal status: MET  2.  Pt will complete TUG in 12 seconds or less with LRAD to demonstrate reduced falls risk with transfers and short distance gait Baseline: 12/13/22: 26.89 seconds w/ RW, 01/24/23: 44.20 seconds w/ 3 pronged hurry cane 02/02/23: 31.05sec with hurry cane 03/07/23: 19.60 with hurrycane Goal status: ONGOING  3.  Pt will improve 5xSTS to 11.4 seconds or less without UE support to demonstrate improved LE strength to match age based norms.  Baseline: 12/13/22: 29.37 seconds 02/02/23: 20.20 seconds 03/07/23: 13.38 seconds  Goal status: ONGOING  4.  Pt will improve FGA by at least 4 points to  demonstrate clinically significant improvement in balance with community  activities.  Baseline: 12/13/22: deferred to next session; 12/29/22: 13/30; 02/02/23:14/30; 03/07/23: 9/30 w/o AD Goal status: ONGOING  5.  Pt will improve gait speed to at least 1.0 m/s with LRAD to demonstrate safe ability to complete short community distances. Baseline: 12/13/22: 0.48 m/s with RW;  02/02/23: 0.32 m/s w/ hurry cane; 03/07/23: 0.86m/s w/ hurry cane Goal status: ONGOING  6. Pt will increase 6 MWT by > 165' to display improvements in functional endurance with community ambulation.   Baseline: 01/12/23: 429 ft w/ 7/10 NPS 02/02/23: Defer to next session 03/07/23: Deferred to next session  Goal status: ONGOING   ASSESSMENT:  CLINICAL IMPRESSION: Session focused on reassessing pt's STG and LTG's as pt requiring re-certification at today's session. Pt notes achieving 3/8 of her goals, including being HEP compliant, demonstrating safe use of LRAD, and meeting her FOTO score at the last session. Though pt has not achieved her functional assessment goals, pt continues to note improvements in her TUG time (19.60 seconds), 5xSTS (13.38 seconds), and gait speed (0.53 m/s) utilizing her hurrycane noting improved bilat LE strengthening and decreased fear of falling during functional activities. Pt notes a decreased FGA score however, pt able to complete this assessment without any AD, which now provides a baseline for completing this assessment in this manner. Pt and PT agree that pt will continue to benefit from skilled PT interventions to address remaining LE strength, balance and endurance deficits to improve pt's functional mobility w/ LRAD, and return to PLOF.  OBJECTIVE IMPAIRMENTS: Abnormal gait, decreased balance, decreased mobility, difficulty walking, decreased ROM, decreased strength, impaired sensation, and postural dysfunction.   ACTIVITY LIMITATIONS: carrying, lifting, bending, standing, stairs, transfers, reach over  head, and locomotion level  PARTICIPATION LIMITATIONS: cleaning, laundry, driving, shopping, and community activity  PERSONAL FACTORS: Age, Past/current experiences, and Time since onset of injury/illness/exacerbation are also affecting patient's functional outcome.   REHAB POTENTIAL: Good  CLINICAL DECISION MAKING: Evolving/moderate complexity  EVALUATION COMPLEXITY: Moderate  PLAN:  PT FREQUENCY: 2x/week  PT DURATION: 12 weeks  PLANNED INTERVENTIONS: Therapeutic exercises, Therapeutic activity, Neuromuscular re-education, Balance training, Gait training, Patient/Family education, Self Care, Stair training, DME instructions, Manual therapy, and Re-evaluation  PLAN FOR NEXT SESSION: Continue gait training w/ LRAD/ no AD and bilat LE strengthening exercises.     Lovie Macadamia, SPT  Delphia Grates. Fairly IV, PT, DPT Physical Therapist- [place]  [facility]  03/07/23, 2:35 PM

## 2023-03-07 NOTE — Progress Notes (Deleted)
Wyline Mood MD, MRCP(U.K) [address]  [address]  [place], [place] [address]  Main: [phone]  Fax: [phone]   Primary Care Physician: Glori Luis, MD  Primary Gastroenterologist:  Dr. Wyline Mood   No chief complaint on file.   HPI: Susan [name] is a 62 y.o. female   Summary of history :  Initially seen on 11/15/2022 for constipation . She can go for 4 to 5 days without a bowel movement then she has explosive diarrhea. She has had issues with constipation after her back surgery when she was in the hospital required laxatives. She consumes artificial sugars in terms of equal which causes her to have watery stools at times.   Interval history  11/15/2022-03/07/2023     ***   Current Outpatient Medications  Medication Sig Dispense Refill   acetaminophen (TYLENOL) 500 MG tablet Take 1,000 mg by mouth 2 (two) times daily.     albuterol (VENTOLIN HFA) 108 (90 Base) MCG/ACT inhaler Inhale 2 puffs into the lungs every 6 (six) hours as needed for wheezing or shortness of breath. 8 g 2   aspirin EC 81 MG tablet Take by mouth.     bisacodyl (MAGIC BULLETS) 10 MG suppository Place 1 suppository (10 mg total) rectally as needed for moderate constipation (if no bowel movement in the morning with colace). 12 suppository 0   carvedilol (COREG) 12.5 MG tablet Take 12.5 mg by mouth 2 (two) times daily.     celecoxib (CELEBREX) 100 MG capsule TAKE 1 CAPSULE(100 MG) BY MOUTH TWICE DAILY 60 capsule 0   clotrimazole (LOTRIMIN) 1 % cream Apply 1 application  topically 2 (two) times daily as needed.     cyclobenzaprine (FLEXERIL) 5 MG tablet Take 5 mg by mouth 2 (two) times daily.     docusate sodium (COLACE) 100 MG capsule Take 1 capsule (100 mg total) by mouth daily. Take with breakfast 30 capsule 5   DULoxetine (CYMBALTA) 20 MG capsule TAKE ONE CAPSULE BY MOUTH DAILY WITH 30 MG 30 capsule 3   DULoxetine (CYMBALTA) 30 MG capsule TAKE ONE CAPSULE BY MOUTH DAILY with 20 mg  cap. 30 capsule 3   gabapentin (NEURONTIN) 300 MG capsule Take 600 mg by mouth.     HYDROcodone-acetaminophen (NORCO/VICODIN) 5-325 MG tablet Take 1 tablet by mouth daily as needed for moderate pain.     lurasidone (LATUDA) 20 MG TABS tablet Take 1 tablet (20 mg total) by mouth daily with supper. 30 tablet 3   naloxone (NARCAN) nasal spray 4 mg/0.1 mL Place into the nose.     OZEMPIC, 1 MG/DOSE, 4 MG/3ML SOPN INJECT 1MG  UNDER THE SKIN (SUBCUTANEOUSLY) ONCE WEEKLY ON SUNDAY 3 mL 1   pregabalin (LYRICA) 75 MG capsule Take 1 capsule (75 mg total) by mouth 2 (two) times daily for 5 days, THEN 2 capsules (150 mg total) 2 (two) times daily for 25 days. Start with 1 capsule (75 mg) of pregabalin twice daily AND reduce your gabapentin half your normal dose (300 mg twice daily) for 5 days. On day 6, stop gabapentin and increase pregabalin to 2 capsules (150 mg) twice daily.. 110 capsule 0   [START ON 03/18/2023] pregabalin (LYRICA) 75 MG capsule Take 2 capsules (150 mg total) by mouth 2 (two) times daily. 120 capsule 1   rosuvastatin (CRESTOR) 20 MG tablet Take 1 tablet (20 mg total) by mouth daily. 90 tablet 3   spironolactone (ALDACTONE) 25 MG tablet TAKE ONE TABLET BY MOUTH  DAILY 90 tablet 1   sulfamethoxazole-trimethoprim (BACTRIM DS) 800-160 MG tablet Take 1 tablet by mouth every 12 (twelve) hours. 14 tablet 0   telmisartan (MICARDIS) 80 MG tablet TAKE ONE TABLET BY MOUTH DAILY 90 tablet 1   traZODone (DESYREL) 50 MG tablet TAKE 1 TABLET(50 MG) BY MOUTH AT BEDTIME 30 tablet 1   Vibegron (GEMTESA) 75 MG TABS Take 1 tablet (75 mg total) by mouth daily. 30 tablet 5   Vitamin D, Ergocalciferol, (DRISDOL) 1.25 MG (50000 UNIT) CAPS capsule TAKE ONE CAPSULE BY MOUTH ONCE WEEKLY ON SUNDAY 13 capsule 1   No current facility-administered medications for this visit.    Allergies as of 03/07/2023 - Review Complete 03/03/2023  Allergen Reaction Noted   Wellbutrin [bupropion]  03/18/2020   Furosemide Other (See  Comments) 11/08/2012   Hydrochlorothiazide Other (See Comments) 08/24/2013       Interval history   ***/***/202*   ***/***/2024   ROS:  General: Negative for anorexia, weight loss, fever, chills, fatigue, weakness. ENT: Negative for hoarseness, difficulty swallowing , nasal congestion. CV: Negative for chest pain, angina, palpitations, dyspnea on exertion, peripheral edema.  Respiratory: Negative for dyspnea at rest, dyspnea on exertion, cough, sputum, wheezing.  GI: See history of present illness. GU:  Negative for dysuria, hematuria, urinary incontinence, urinary frequency, nocturnal urination.  Endo: Negative for unusual weight change.    Physical Examination:   There were no vitals taken for this visit.  General: Well-nourished, well-developed in no acute distress.  Eyes: No icterus. Conjunctivae pink. Mouth: Oropharyngeal mucosa moist and pink , no lesions erythema or exudate. Lungs: Clear to auscultation bilaterally. Non-labored. Heart: Regular rate and rhythm, no murmurs rubs or gallops.  Abdomen: Bowel sounds are normal, nontender, nondistended, no hepatosplenomegaly or masses, no abdominal bruits or hernia , no rebound or guarding.   Extremities: No lower extremity edema. No clubbing or deformities. Neuro: Alert and oriented x 3.  Grossly intact. Skin: Warm and dry, no jaundice.   Psych: Alert and cooperative, normal mood and affect.   Imaging Studies: No results found.  Assessment and Plan:   Susan [name] is a 62 y.o. y/o female here to follow up for constipation   Plan      Dr Wyline Mood  MD,MRCP [facility]) Follow up in ***  BP check ***

## 2023-03-07 NOTE — Telephone Encounter (Signed)
Dr. Tobi Bastos reviewed patient's chart and read that his last plan for the patient was to schedule a colonoscopy due to her symptoms. But patient never scheduled it after recommending. Today when I called her, she stated that she was felling better from when she called [name] to schedule today's appointment. However, she wanted to know if Dr. Tobi Bastos was able to prescribe her a Cologard. I told her that Dr. Tobi Bastos does not prescribe it since he will want for her to schedule her colonoscopy. Therefore, I recommended for her to call her PCP-Dr. Birdie Sons to prescribe her the Cologard as that's the route she wants to take. Patient agreed and had no further questions.

## 2023-03-09 ENCOUNTER — Other Ambulatory Visit: Payer: Self-pay | Admitting: Psychiatry

## 2023-03-09 ENCOUNTER — Other Ambulatory Visit: Payer: Self-pay | Admitting: Family Medicine

## 2023-03-09 ENCOUNTER — Ambulatory Visit: Payer: 59

## 2023-03-09 DIAGNOSIS — R262 Difficulty in walking, not elsewhere classified: Secondary | ICD-10-CM | POA: Diagnosis not present

## 2023-03-09 DIAGNOSIS — M6281 Muscle weakness (generalized): Secondary | ICD-10-CM

## 2023-03-09 DIAGNOSIS — F4310 Post-traumatic stress disorder, unspecified: Secondary | ICD-10-CM

## 2023-03-09 DIAGNOSIS — F3131 Bipolar disorder, current episode depressed, mild: Secondary | ICD-10-CM

## 2023-03-09 DIAGNOSIS — I10 Essential (primary) hypertension: Secondary | ICD-10-CM

## 2023-03-09 NOTE — Therapy (Addendum)
OUTPATIENT PHYSICAL THERAPY NEURO TREATMENT  Patient Name: Susan [name] MRN: [id number] DOB:08-30-1960, 62 y.o., female Today's Date: 03/09/2023   PCP: [name] MD REFERRING PROVIDER: Venetia Night MD  END OF SESSION:  PT End of Session - 03/09/23 0808     Visit Number 17    Number of Visits 25    Date for PT Re-Evaluation 04/18/23    PT Start Time 0815    PT Stop Time 0856    PT Time Calculation (min) 41 min    Equipment Utilized During Treatment Gait belt    Activity Tolerance Patient tolerated treatment well    Behavior During Therapy [facility] for tasks assessed/performed             Past Medical History:  Diagnosis Date   ADD (attention deficit disorder)    Alcohol abuse    Anemia    Anxiety    Aortic atherosclerosis (HCC) 02/20/2018   Chest CT Sept 2019   Arthritis    rheumatoid arthritis   Asthma    Bipolar disorder (HCC)    Centrilobular emphysema (HCC)    Cocaine use disorder, moderate, in sustained remission (HCC)    Constipation    Degenerative disc disease at L5-S1 level 09/28/2016   See ortho note May 2018   Depression    bipolar, hx of suicide attempt   Diabetes mellitus, type 2 (HCC)    Diverticulitis 2013   DOE (dyspnea on exertion)    Drug use    Family history of adverse reaction to anesthesia    mom-delayed emergence   GERD (gastroesophageal reflux disease)    H/O suicide attempt    slit wrists   Hepatitis C 06/26/2014   treated with Harvoni   Hip pain    History of echocardiogram    a. 04/2019 Echo: EF >65%, nl RV fxn.   History of MRSA infection 2013   HLD (hyperlipidemia)    Hypertension    a. 06/2021 Renal Duplex: ? bilat RAS; b. 06/2021 CTA Abd: No signif RAS.   Hyponatremia    Hypothyroidism    Incisional hernia 11/08/2012   Knee pain    Left carotid bruit    Morbid obesity (HCC)    Multinodular thyroid    OSA (obstructive sleep apnea)    OSA on CPAP    Osteoporosis    Palpitations    Post-traumatic  osteoarthritis of right knee 09/24/2015   Recurrent ventral hernia 11/08/2012   Status post total right knee replacement using cement 05/10/2016   Stroke (HCC) 07/05/2022   right arm,torso and right leg numbness   Tobacco use disorder    Vitamin B12 deficiency    Vitamin D deficiency disease    Past Surgical History:  Procedure Laterality Date   BILATERAL SALPINGOOPHORECTOMY     due to abnormal mass   BREAST BIOPSY Left    neg   BREAST SURGERY Left 20 yrs ago   CESAREAN SECTION     COLONOSCOPY     COLONOSCOPY WITH PROPOFOL N/A 10/16/2017   Procedure: COLONOSCOPY WITH PROPOFOL;  Surgeon: Wyline Mood, MD;  Location: [facility] ENDOSCOPY;  Service: Gastroenterology;  Laterality: N/A;   COLONOSCOPY WITH PROPOFOL N/A 02/16/2021   Procedure: COLONOSCOPY WITH PROPOFOL;  Surgeon: Wyline Mood, MD;  Location: [facility] ENDOSCOPY;  Service: Gastroenterology;  Laterality: N/A;   FORAMINOTOMY 1 LEVEL Right 10/10/2022   Procedure: RIGHT L4-5 LAMINOFORAMINOTOMY;  Surgeon: Venetia Night, MD;  Location: [facility] ORS;  Service: Neurosurgery;  Laterality: Right;   HERNIA  REPAIR  06/2011, July 2014   Ventral wall repair with Physiomesh   HERNIA REPAIR     2nd.vental wall repair   JOINT REPLACEMENT Right    knee   LUMBAR LAMINECTOMY/ DECOMPRESSION WITH MET-RX N/A 10/12/2022   Procedure: LUMBAR LAMINECTOMY/ DECOMPRESSION WITH MET-RX;  Surgeon: Venetia Night, MD;  Location: [facility] ORS;  Service: Neurosurgery;  Laterality: N/A;   TONSILLECTOMY     TOTAL HIP ARTHROPLASTY Left 07/23/2019   Procedure: TOTAL HIP ARTHROPLASTY;  Surgeon: Christena Flake, MD;  Location: [facility] ORS;  Service: Orthopedics;  Laterality: Left;   TOTAL KNEE ARTHROPLASTY Right 05/10/2016   Procedure: TOTAL KNEE ARTHROPLASTY;  Surgeon: Christena Flake, MD;  Location: [facility] ORS;  Service: Orthopedics;  Laterality: Right;   TUBAL LIGATION     Patient Active Problem List   Diagnosis Date Noted   Neurogenic bowel 02/13/2023   Neurogenic bladder 02/13/2023    Abnormality of gait and mobility 02/13/2023   Blister of foot without infection, right, initial encounter 01/05/2023   Bipolar 1 disorder, depressed, moderate (HCC) 11/29/2022   Anemia 10/13/2022   Cauda equina syndrome (HCC) 10/12/2022   Epidural hematoma (HCC) 10/12/2022   Mass in epidural space 10/12/2022   Synovial cyst of lumbar facet joint 10/10/2022   Lumbar radiculopathy 10/10/2022   S/P laminectomy 10/10/2022   Epigastric pain 07/22/2022   Enlarged thyroid 07/22/2022   Chronic diarrhea 07/22/2022   CVA (cerebrovascular accident) (HCC) 07/05/2022   Skin lesion 06/28/2022   BMI 38.0-38.9,adult 07/14/2021   Type 2 diabetes mellitus without complication, without long-term current use of insulin (HCC) 06/16/2021   H/O thyroid nodule 05/31/2021   History of hepatitis C- treated 2014- 2015 with [facility] gastroenterology and followed.  05/19/2021   Left carotid bruit 04/28/2021   Anxiety 05/28/2020   Bipolar affective disorder in remission (HCC) 05/28/2020   Bipolar disorder, in full remission, most recent episode mixed (HCC) 03/06/2020   Need for immunization against influenza 01/27/2020   Morbid obesity (HCC) 01/27/2020   Osteoporosis 11/06/2019   Bipolar 1 disorder, mixed, mild (HCC) 10/29/2019   PTSD (post-traumatic stress disorder) 09/27/2019   Bereavement 09/27/2019   Cocaine use disorder, moderate, in sustained remission (HCC) 09/27/2019   Status post total hip replacement, left 07/23/2019   Primary osteoarthritis of left hip 06/14/2019   Depression 10/23/2018   Allergic rhinitis 08/23/2018   Aortic atherosclerosis (HCC) 02/20/2018   Centrilobular emphysema (HCC) 02/20/2018   Hyperlipidemia 03/28/2017   Degenerative disc disease at L5-S1 level 09/28/2016   Tobacco use disorder 04/21/2016   GERD (gastroesophageal reflux disease) 12/17/2015   Chronic back pain 10/09/2015   Post-traumatic osteoarthritis of right knee 09/24/2015   Alcohol use disorder, severe, in sustained  remission (HCC) 04/13/2015   Vitamin D deficiency 04/09/2015   Vitamin B12 deficiency 04/09/2015   OSA on CPAP 04/09/2015   Subclinical hyperthyroidism 06/26/2014   Goiter colloid, toxic, nodular 06/26/2014   Hypertension 06/26/2014   Tobacco use disorder, continuous 06/26/2014   Bipolar I disorder (HCC) 06/26/2014   Lumbar radiculitis 10/03/2013   Diverticulosis 08/24/2013    ONSET DATE: 10/12/22  REFERRING DIAG: H84.696 (ICD-10-CM) - Status post lumbar spine surgery for decompression of spinal cord G83.4 (ICD-10-CM) - Cauda equina syndrome (HCC)  THERAPY DIAG:  Muscle weakness (generalized)  Difficulty in walking, not elsewhere classified  Rationale for Evaluation and Treatment: Rehabilitation  SUBJECTIVE:  SUBJECTIVE STATEMENT:  Pt reports 3-4/10 LBP at today's session. Pt noted mild soreness following last visit in bilat LE's.   Pt accompanied by: self  PERTINENT HISTORY:  Susan [name] is a 62 y/o F with PMH: HTN, DM2, HLD, ETOH use, THA, PTSD, cocaine, bipolar, and L thalamic infarct (06/2022), s/p lumbar laminoforaminotomy (10/10/22). Presenting post L3-5 decompression with evacuation of epidural hematoma on 10/12/22 for Cauda Equina Syndrome. Pt has residual RLE deficits after her lumbar surgeries leading to ambulating with RW. She was independent prior to her surgeries now reporting balance and gait difficulties. Has had some Worsening RUE N/T since her CVA in February after her lumbar surgeries. Reports N/T 6-7/10 on severity. Mentions some falls when not using her RW. Feels like she always has to have a hand on something to steady herself.  Pt has residual RLE deficits after her lumbar surgeries leading to ambulating with RW. She was independent prior to her surgeries now reporting balance and  gait difficulties. Has had some Worsening RUE N/T since her CVA in February after her lumbar surgeries. Reports N/T 6-7/10 on severity. Mentions some falls when not using her RW. Feels like she always has to have a hand on something to steady herself. Has had some falls anterior standing too close in the RW, a fall walking up the stairs with the RW. She is able to get herself up. Pt describing some unsafe scenarios with poor RW use with transfers and asc/desc curbs.   PAIN:  Are you having pain? No  PRECAUTIONS: Back  RED FLAGS: Bowel or bladder incontinence: Yes: MD aware since surgery.     WEIGHT BEARING RESTRICTIONS: No  FALLS: Has patient fallen in last 6 months? Yes. Number of falls 3  LIVING ENVIRONMENT: Lives with: lives with their son   PLOF: Independent  PATIENT GOALS: Improve her walking and   OBJECTIVE:   DIAGNOSTIC FINDINGS: CLINICAL DATA:  Elective surgery   EXAM: LUMBAR SPINE - 2-3 VIEW   COMPARISON:  10/10/2022   FINDINGS: Intraoperative fluoroscopy is utilized for surgical control purposes. Fluoroscopy time is recorded at 6 seconds. Dose 5.33 mGy. Two spot fluoroscopic images are obtained. Initial image demonstrates a localization marker posterior to the L4 vertebra. Second image demonstrates a localization marker posterior to the L3-4 interspace level.   IMPRESSION: Intraoperative fluoroscopy utilized for surgical control purposes for lumbar spine localization.     Electronically Signed   By: Burman Nieves M.D.   On: 10/12/2022 20:24  COGNITION: Overall cognitive status: Within functional limits for tasks assessed   SENSATION: Light touch: Impaired  RLE: impaired L2, L4-L5  COORDINATION: Heel to shin normal bilaterally   PROPRIOCEPTION: Normal bilat   POSTURE:  Heavy BUE support on RW with standing tasks with bilateral upper trap use   LUMBAR AROM: Flexion: 75% limited  Extension: 50% limited  Rotation R/L: 50% limited  Lateral  flexion R/L: 50% limited  LOWER EXTREMITY AROM:  L ankle inversion: 20, eversion: 35  R ankle inversion: 15 , eversion: 25    LOWER EXTREMITY MMT:    MMT Right Eval Left Eval  Hip flexion 4- 4  Hip extension    Hip abduction    Hip adduction    Hip internal rotation    Hip external rotation    Knee flexion 4- 4+  Knee extension 4 4+  Ankle dorsiflexion 4- 4+  Ankle plantarflexion    Ankle inversion 3 4+  Ankle eversion 3 4-  (Blank rows = not tested)  TRANSFERS: Assistive device utilized: Environmental consultant - 2 wheeled  Sit to stand: Modified independence Stand to sit: Modified independence  *poor hand placement keeping BUE's on RW for STS and stand to sit despite VC's and education for safe hand placement.   GAIT: Gait pattern: step through pattern, decreased stance time- Right, decreased ankle dorsiflexion- Right, and poor foot clearance- Right. Notable R ankle inversion in stance phase due to evertor weakness Distance walked: 10 meters Assistive device utilized: Walker - 2 wheeled Level of assistance: Modified independence Comments: Heavy BUE support on RW. PT adjusting RW height to reduced B shoulder shrug.   FUNCTIONAL TESTS:  5 times sit to stand: 29.37 seconds Timed up and go (TUG): 26.89 seconds 10 meter walk test: 0.48 m/s with RW : 423ft PATIENT SURVEYS:  FOTO 37 with target score of 43  TODAY'S TREATMENT: DATE: 03/09/23  Neuro re-ed:   administered today utilizing hurrycane: 405 ft   STS to ambulation CGA with hurry cane in RUE 2 x25'. Progressed with 2# AW's on ankles for strengthening. VC's for progressing to step through pattern. Good foot clearance bilaterally but displays minimal step through pattern due to added weight.    3x6" hurdle clearance with 2# AW's donned. Hurrycane in RUE and HHA on LLE.Min VC's for foot clearance and navigation due to added weight. CGA.  3 x25' down and back.  There-ex:  Mini squats at elevated plinth table 2 x10  w/ 2KG medicine ball  Step ups onto 6" step with bilat UE support LLE forward x10    PATIENT EDUCATION: Education details: HEP, safe use of AD, POC Person educated: Patient Education method: Explanation, Demonstration, Verbal cues, and Handouts Education comprehension: verbalized understanding and needs further education  HOME EXERCISE PROGRAM: Access Code: [email or web address] URL: [email or web address] Date: 01/24/2023 Prepared by: Ronnie Derby  Exercises - Seated Scapular Retraction  - 1 x daily - 7 x weekly - 3 sets - 15 reps - Seated Ankle Circles  - 1 x daily - 7 x weekly - 3 sets - 15 reps - Seated Heel Toe Raises  - 1 x daily - 4-5 x weekly - 3 sets - 10 reps - Sit to Stand with Counter Support  - 1 x daily - 4-5 x weekly - 3 sets - 8 reps   Access Code: [email or web address] URL: [email or web address] Date: 12/13/2022 Prepared by: Ronnie Derby  Exercises - Supine Bridge  - 1 x daily - 7 x weekly - 3 sets - 6 reps - Seated Scapular Retraction  - 1 x daily - 7 x weekly - 3 sets - 15 reps - Seated Ankle Circles  - 1 x daily - 7 x weekly - 3 sets - 15 reps  GOALS: Goals reviewed with patient? No  SHORT TERM GOALS: Target date: 12/24/22  Pt will be independent with HEP to improve RLE strength for transfers and gait Baseline: 12/13/22: Provided initial HEP 02/02/23: HEP compliant Goal status: MET  2.  Pt will demonstrate safe use of hand placement with transfers with LRAD to demonstrate reduced falls risk. Baseline: 12/13/22: BUE use on AD with STS and stand to sit transfers 02/02/23: Correct hand placement noted  Goal status: MET  LONG TERM GOALS: Target date: 04/18/23  Pt will improve FOTO to target score to demonstrate clinically significant improvement in functional mobility.  Baseline: 12/13/22: 37/43; 02/02/23: 43/43 03/07/23 36/43 (answered questions based on not using RW) Goal status: MET  2.  Pt will complete TUG in 12 seconds or less  with LRAD to  demonstrate reduced falls risk with transfers and short distance gait Baseline: 12/13/22: 26.89 seconds w/ RW, 01/24/23: 44.20 seconds w/ 3 pronged hurry cane 02/02/23: 31.05sec with hurry cane 03/07/23: 19.60 with hurrycane Goal status: ONGOING  3.  Pt will improve 5xSTS to 11.4 seconds or less without UE support to demonstrate improved LE strength to match age based norms.  Baseline: 12/13/22: 29.37 seconds 02/02/23: 20.20 seconds 03/07/23: 13.38 seconds  Goal status: ONGOING  4.  Pt will improve FGA by at least 4 points to demonstrate clinically significant improvement in balance with community activities.  Baseline: 12/13/22: deferred to next session; 12/29/22: 13/30; 02/02/23:14/30; 03/07/23: 9/30 w/o AD Goal status: ONGOING  5.  Pt will improve gait speed to at least 1.0 m/s with LRAD to demonstrate safe ability to complete short community distances. Baseline: 12/13/22: 0.48 m/s with RW;  02/02/23: 0.32 m/s w/ hurry cane; 03/07/23: 0.61m/s w/ hurry cane Goal status: ONGOING  6. Pt will increase 6 MWT by > 165' to display improvements in functional endurance with community ambulation.   Baseline: 01/12/23: 429 ft w/ 7/10 NPS 02/02/23: Defer to next session 03/07/23: Deferred to next session  Goal status: ONGOING   ASSESSMENT:  CLINICAL IMPRESSION: Session focused on administering the and progressing ambulation/ obstacle clearance with the hurrycane, along with bilat LE strengthening. Pt able to complete utilizing the hurrycane noting ambulating 424ft, requiring two seated rest breaks and multiple standing rest breaks. Though pt did not progress is distance traveled, she does show progression in the ability to use a LRAD (RW to [employer]). Pt continues to note deficits with obstacle clearance noting difficulty with proper LE negotiation of trailing leg to clear obstacle. Future sessions will continue to focus on bilat LE strengthening and proper technique to promote adequate clearance to safely  negotiate obstacles (curbs, steps, etc.) Pt will continue to benefit from skilled PT interventions to address remaining LE strength, balance and endurance deficits to improve pt's functional mobility w/ LRAD, and return to PLOF.  OBJECTIVE IMPAIRMENTS: Abnormal gait, decreased balance, decreased mobility, difficulty walking, decreased ROM, decreased strength, impaired sensation, and postural dysfunction.   ACTIVITY LIMITATIONS: carrying, lifting, bending, standing, stairs, transfers, reach over head, and locomotion level  PARTICIPATION LIMITATIONS: cleaning, laundry, driving, shopping, and community activity  PERSONAL FACTORS: Age, Past/current experiences, and Time since onset of injury/illness/exacerbation are also affecting patient's functional outcome.   REHAB POTENTIAL: Good  CLINICAL DECISION MAKING: Evolving/moderate complexity  EVALUATION COMPLEXITY: Moderate  PLAN:  PT FREQUENCY: 2x/week  PT DURATION: 12 weeks  PLANNED INTERVENTIONS: Therapeutic exercises, Therapeutic activity, Neuromuscular re-education, Balance training, Gait training, Patient/Family education, Self Care, Stair training, DME instructions, Manual therapy, and Re-evaluation  PLAN FOR NEXT SESSION: Update HEP, outside ambulation, Continue gait training w/ LRAD/ no AD and bilat LE strengthening exercises.     Lovie Macadamia, SPT  Delphia Grates. Fairly IV, PT, DPT Physical Therapist- [facility]  03/09/23, 12:43 PM

## 2023-03-10 MED ORDER — DULOXETINE HCL 20 MG PO CPEP
ORAL_CAPSULE | ORAL | 5 refills | Status: DC
Start: 2023-03-10 — End: 2023-08-09

## 2023-03-10 NOTE — Telephone Encounter (Signed)
I have sent duloxetine to pharmacy.

## 2023-03-13 ENCOUNTER — Ambulatory Visit: Payer: 59 | Attending: Neurosurgery

## 2023-03-13 DIAGNOSIS — M6281 Muscle weakness (generalized): Secondary | ICD-10-CM | POA: Insufficient documentation

## 2023-03-13 DIAGNOSIS — R262 Difficulty in walking, not elsewhere classified: Secondary | ICD-10-CM | POA: Insufficient documentation

## 2023-03-13 NOTE — Therapy (Addendum)
OUTPATIENT PHYSICAL THERAPY NEURO TREATMENT  Patient Name: Susan [name] MRN: [id number] DOB:02/16/1961, 62 y.o., female Today's Date: 03/13/2023   PCP: [name] MD REFERRING PROVIDER: Venetia Night MD  END OF SESSION:  PT End of Session - 03/13/23 0943     Visit Number 18    Number of Visits 25    Date for PT Re-Evaluation 04/18/23    PT Start Time 0945    PT Stop Time 1030    PT Time Calculation (min) 45 min    Equipment Utilized During Treatment Gait belt    Activity Tolerance Patient tolerated treatment well    Behavior During Therapy [facility] for tasks assessed/performed             Past Medical History:  Diagnosis Date   ADD (attention deficit disorder)    Alcohol abuse    Anemia    Anxiety    Aortic atherosclerosis (HCC) 02/20/2018   Chest CT Sept 2019   Arthritis    rheumatoid arthritis   Asthma    Bipolar disorder (HCC)    Centrilobular emphysema (HCC)    Cocaine use disorder, moderate, in sustained remission (HCC)    Constipation    Degenerative disc disease at L5-S1 level 09/28/2016   See ortho note May 2018   Depression    bipolar, hx of suicide attempt   Diabetes mellitus, type 2 (HCC)    Diverticulitis 2013   DOE (dyspnea on exertion)    Drug use    Family history of adverse reaction to anesthesia    mom-delayed emergence   GERD (gastroesophageal reflux disease)    H/O suicide attempt    slit wrists   Hepatitis C 06/26/2014   treated with Harvoni   Hip pain    History of echocardiogram    a. 04/2019 Echo: EF >65%, nl RV fxn.   History of MRSA infection 2013   HLD (hyperlipidemia)    Hypertension    a. 06/2021 Renal Duplex: ? bilat RAS; b. 06/2021 CTA Abd: No signif RAS.   Hyponatremia    Hypothyroidism    Incisional hernia 11/08/2012   Knee pain    Left carotid bruit    Morbid obesity (HCC)    Multinodular thyroid    OSA (obstructive sleep apnea)    OSA on CPAP    Osteoporosis    Palpitations    Post-traumatic  osteoarthritis of right knee 09/24/2015   Recurrent ventral hernia 11/08/2012   Status post total right knee replacement using cement 05/10/2016   Stroke (HCC) 07/05/2022   right arm,torso and right leg numbness   Tobacco use disorder    Vitamin B12 deficiency    Vitamin D deficiency disease    Past Surgical History:  Procedure Laterality Date   BILATERAL SALPINGOOPHORECTOMY     due to abnormal mass   BREAST BIOPSY Left    neg   BREAST SURGERY Left 20 yrs ago   CESAREAN SECTION     COLONOSCOPY     COLONOSCOPY WITH PROPOFOL N/A 10/16/2017   Procedure: COLONOSCOPY WITH PROPOFOL;  Surgeon: Wyline Mood, MD;  Location: [facility] ENDOSCOPY;  Service: Gastroenterology;  Laterality: N/A;   COLONOSCOPY WITH PROPOFOL N/A 02/16/2021   Procedure: COLONOSCOPY WITH PROPOFOL;  Surgeon: Wyline Mood, MD;  Location: [facility] ENDOSCOPY;  Service: Gastroenterology;  Laterality: N/A;   FORAMINOTOMY 1 LEVEL Right 10/10/2022   Procedure: RIGHT L4-5 LAMINOFORAMINOTOMY;  Surgeon: Venetia Night, MD;  Location: [facility] ORS;  Service: Neurosurgery;  Laterality: Right;   HERNIA  REPAIR  06/2011, July 2014   Ventral wall repair with Physiomesh   HERNIA REPAIR     2nd.vental wall repair   JOINT REPLACEMENT Right    knee   LUMBAR LAMINECTOMY/ DECOMPRESSION WITH MET-RX N/A 10/12/2022   Procedure: LUMBAR LAMINECTOMY/ DECOMPRESSION WITH MET-RX;  Surgeon: Venetia Night, MD;  Location: [facility] ORS;  Service: Neurosurgery;  Laterality: N/A;   TONSILLECTOMY     TOTAL HIP ARTHROPLASTY Left 07/23/2019   Procedure: TOTAL HIP ARTHROPLASTY;  Surgeon: Christena Flake, MD;  Location: [facility] ORS;  Service: Orthopedics;  Laterality: Left;   TOTAL KNEE ARTHROPLASTY Right 05/10/2016   Procedure: TOTAL KNEE ARTHROPLASTY;  Surgeon: Christena Flake, MD;  Location: [facility] ORS;  Service: Orthopedics;  Laterality: Right;   TUBAL LIGATION     Patient Active Problem List   Diagnosis Date Noted   Neurogenic bowel 02/13/2023   Neurogenic bladder 02/13/2023    Abnormality of gait and mobility 02/13/2023   Blister of foot without infection, right, initial encounter 01/05/2023   Bipolar 1 disorder, depressed, moderate (HCC) 11/29/2022   Anemia 10/13/2022   Cauda equina syndrome (HCC) 10/12/2022   Epidural hematoma (HCC) 10/12/2022   Mass in epidural space 10/12/2022   Synovial cyst of lumbar facet joint 10/10/2022   Lumbar radiculopathy 10/10/2022   S/P laminectomy 10/10/2022   Epigastric pain 07/22/2022   Enlarged thyroid 07/22/2022   Chronic diarrhea 07/22/2022   CVA (cerebrovascular accident) (HCC) 07/05/2022   Skin lesion 06/28/2022   BMI 38.0-38.9,adult 07/14/2021   Type 2 diabetes mellitus without complication, without long-term current use of insulin (HCC) 06/16/2021   H/O thyroid nodule 05/31/2021   History of hepatitis C- treated 2014- 2015 with [facility] gastroenterology and followed.  05/19/2021   Left carotid bruit 04/28/2021   Anxiety 05/28/2020   Bipolar affective disorder in remission (HCC) 05/28/2020   Bipolar disorder, in full remission, most recent episode mixed (HCC) 03/06/2020   Need for immunization against influenza 01/27/2020   Morbid obesity (HCC) 01/27/2020   Osteoporosis 11/06/2019   Bipolar 1 disorder, mixed, mild (HCC) 10/29/2019   PTSD (post-traumatic stress disorder) 09/27/2019   Bereavement 09/27/2019   Cocaine use disorder, moderate, in sustained remission (HCC) 09/27/2019   Status post total hip replacement, left 07/23/2019   Primary osteoarthritis of left hip 06/14/2019   Depression 10/23/2018   Allergic rhinitis 08/23/2018   Aortic atherosclerosis (HCC) 02/20/2018   Centrilobular emphysema (HCC) 02/20/2018   Hyperlipidemia 03/28/2017   Degenerative disc disease at L5-S1 level 09/28/2016   Tobacco use disorder 04/21/2016   GERD (gastroesophageal reflux disease) 12/17/2015   Chronic back pain 10/09/2015   Post-traumatic osteoarthritis of right knee 09/24/2015   Alcohol use disorder, severe, in sustained  remission (HCC) 04/13/2015   Vitamin D deficiency 04/09/2015   Vitamin B12 deficiency 04/09/2015   OSA on CPAP 04/09/2015   Subclinical hyperthyroidism 06/26/2014   Goiter colloid, toxic, nodular 06/26/2014   Hypertension 06/26/2014   Tobacco use disorder, continuous 06/26/2014   Bipolar I disorder (HCC) 06/26/2014   Lumbar radiculitis 10/03/2013   Diverticulosis 08/24/2013    ONSET DATE: 10/12/22  REFERRING DIAG: W09.811 (ICD-10-CM) - Status post lumbar spine surgery for decompression of spinal cord G83.4 (ICD-10-CM) - Cauda equina syndrome (HCC)  THERAPY DIAG:  Muscle weakness (generalized)  Difficulty in walking, not elsewhere classified  Rationale for Evaluation and Treatment: Rehabilitation  SUBJECTIVE:  SUBJECTIVE STATEMENT:  Pt reports 5/10 LBP at today's session. Pt presented to PT with concerns of numbness in bilat fingers and toes d/t the cold weather. No notable changes since last session.   Pt accompanied by: self  PERTINENT HISTORY:  Eliyana Pagliaro is a 62 y/o F with PMH: HTN, DM2, HLD, ETOH use, THA, PTSD, cocaine, bipolar, and L thalamic infarct (06/2022), s/p lumbar laminoforaminotomy (10/10/22). Presenting post L3-5 decompression with evacuation of epidural hematoma on 10/12/22 for Cauda Equina Syndrome. Pt has residual RLE deficits after her lumbar surgeries leading to ambulating with RW. She was independent prior to her surgeries now reporting balance and gait difficulties. Has had some Worsening RUE N/T since her CVA in February after her lumbar surgeries. Reports N/T 6-7/10 on severity. Mentions some falls when not using her RW. Feels like she always has to have a hand on something to steady herself.  Pt has residual RLE deficits after her lumbar surgeries leading to ambulating with  RW. She was independent prior to her surgeries now reporting balance and gait difficulties. Has had some Worsening RUE N/T since her CVA in February after her lumbar surgeries. Reports N/T 6-7/10 on severity. Mentions some falls when not using her RW. Feels like she always has to have a hand on something to steady herself. Has had some falls anterior standing too close in the RW, a fall walking up the stairs with the RW. She is able to get herself up. Pt describing some unsafe scenarios with poor RW use with transfers and asc/desc curbs.   PAIN:  Are you having pain? No  PRECAUTIONS: Back  RED FLAGS: Bowel or bladder incontinence: Yes: MD aware since surgery.     WEIGHT BEARING RESTRICTIONS: No  FALLS: Has patient fallen in last 6 months? Yes. Number of falls 3  LIVING ENVIRONMENT: Lives with: lives with their son   PLOF: Independent  PATIENT GOALS: Improve her walking and   OBJECTIVE:   DIAGNOSTIC FINDINGS: CLINICAL DATA:  Elective surgery   EXAM: LUMBAR SPINE - 2-3 VIEW   COMPARISON:  10/10/2022   FINDINGS: Intraoperative fluoroscopy is utilized for surgical control purposes. Fluoroscopy time is recorded at 6 seconds. Dose 5.33 mGy. Two spot fluoroscopic images are obtained. Initial image demonstrates a localization marker posterior to the L4 vertebra. Second image demonstrates a localization marker posterior to the L3-4 interspace level.   IMPRESSION: Intraoperative fluoroscopy utilized for surgical control purposes for lumbar spine localization.     Electronically Signed   By: Burman Nieves M.D.   On: 10/12/2022 20:24  COGNITION: Overall cognitive status: Within functional limits for tasks assessed   SENSATION: Light touch: Impaired  RLE: impaired L2, L4-L5  COORDINATION: Heel to shin normal bilaterally   PROPRIOCEPTION: Normal bilat   POSTURE:  Heavy BUE support on RW with standing tasks with bilateral upper trap use   LUMBAR AROM: Flexion:  75% limited  Extension: 50% limited  Rotation R/L: 50% limited  Lateral flexion R/L: 50% limited  LOWER EXTREMITY AROM:  L ankle inversion: 20, eversion: 35  R ankle inversion: 15 , eversion: 25    LOWER EXTREMITY MMT:    MMT Right Eval Left Eval  Hip flexion 4- 4  Hip extension    Hip abduction    Hip adduction    Hip internal rotation    Hip external rotation    Knee flexion 4- 4+  Knee extension 4 4+  Ankle dorsiflexion 4- 4+  Ankle plantarflexion    Ankle inversion  3 4+  Ankle eversion 3 4-  (Blank rows = not tested)  TRANSFERS: Assistive device utilized: Environmental consultant - 2 wheeled  Sit to stand: Modified independence Stand to sit: Modified independence  *poor hand placement keeping BUE's on RW for STS and stand to sit despite VC's and education for safe hand placement.   GAIT: Gait pattern: step through pattern, decreased stance time- Right, decreased ankle dorsiflexion- Right, and poor foot clearance- Right. Notable R ankle inversion in stance phase due to evertor weakness Distance walked: 10 meters Assistive device utilized: Walker - 2 wheeled Level of assistance: Modified independence Comments: Heavy BUE support on RW. PT adjusting RW height to reduced B shoulder shrug.   FUNCTIONAL TESTS:  5 times sit to stand: 29.37 seconds Timed up and go (TUG): 26.89 seconds 10 meter walk test: 0.48 m/s with RW : 488ft PATIENT SURVEYS:  FOTO 37 with target score of 43  TODAY'S TREATMENT: DATE: 03/13/23  There-ex:  Nustep x5 minutes Lvl 3.0 seat #7  STS from plinth table w/ overhead bilat shoulder flexion w/ 3 KG med ball, 2x8 Standing alternating marches w/ 2# AW's RLE/LLE 3 x30 sec   Neuro re-ed:  STS to ambulation CGA with hurry cane in RUE 2 x50' down and back. Progressed with 2# AW's on ankles for strengthening. VC's for progressing to step through pattern. Good foot clearance bilaterally but displays minimal step through pattern due to added weight.   STS  to ambulation CGA w/ hurrycane 3 x20' down and back around (3) cones emphasizing "Figure- 8" turning around cones. VC's needed for proper hurrycane placement and steppage pattern to navigate turns.    PATIENT EDUCATION: Education details: HEP, safe use of AD, POC Person educated: Patient Education method: Explanation, Demonstration, Verbal cues, and Handouts Education comprehension: verbalized understanding and needs further education  HOME EXERCISE PROGRAM: Access Code: [email or web address] URL: [email or web address] Date: 01/24/2023 Prepared by: Ronnie Derby  Exercises - Seated Scapular Retraction  - 1 x daily - 7 x weekly - 3 sets - 15 reps - Seated Ankle Circles  - 1 x daily - 7 x weekly - 3 sets - 15 reps - Seated Heel Toe Raises  - 1 x daily - 4-5 x weekly - 3 sets - 10 reps - Sit to Stand with Counter Support  - 1 x daily - 4-5 x weekly - 3 sets - 8 reps   Access Code: [email or web address] URL: [email or web address] Date: 12/13/2022 Prepared by: Ronnie Derby  Exercises - Supine Bridge  - 1 x daily - 7 x weekly - 3 sets - 6 reps - Seated Scapular Retraction  - 1 x daily - 7 x weekly - 3 sets - 15 reps - Seated Ankle Circles  - 1 x daily - 7 x weekly - 3 sets - 15 reps  GOALS: Goals reviewed with patient? No  SHORT TERM GOALS: Target date: 12/24/22  Pt will be independent with HEP to improve RLE strength for transfers and gait Baseline: 12/13/22: Provided initial HEP 02/02/23: HEP compliant Goal status: MET  2.  Pt will demonstrate safe use of hand placement with transfers with LRAD to demonstrate reduced falls risk. Baseline: 12/13/22: BUE use on AD with STS and stand to sit transfers 02/02/23: Correct hand placement noted  Goal status: MET  LONG TERM GOALS: Target date: 04/18/23  Pt will improve FOTO to target score to demonstrate clinically significant improvement in functional mobility.  Baseline: 12/13/22: 37/43; 02/02/23: 43/43 03/07/23 36/43 (answered questions  based on not  using RW) Goal status: MET  2.  Pt will complete TUG in 12 seconds or less with LRAD to demonstrate reduced falls risk with transfers and short distance gait Baseline: 12/13/22: 26.89 seconds w/ RW, 01/24/23: 44.20 seconds w/ 3 pronged hurry cane 02/02/23: 31.05sec with hurry cane 03/07/23: 19.60 with hurrycane Goal status: ONGOING  3.  Pt will improve 5xSTS to 11.4 seconds or less without UE support to demonstrate improved LE strength to match age based norms.  Baseline: 12/13/22: 29.37 seconds 02/02/23: 20.20 seconds 03/07/23: 13.38 seconds  Goal status: ONGOING  4.  Pt will improve FGA by at least 4 points to demonstrate clinically significant improvement in balance with community activities.  Baseline: 12/13/22: deferred to next session; 12/29/22: 13/30; 02/02/23:14/30; 03/07/23: 9/30 w/o AD Goal status: ONGOING  5.  Pt will improve gait speed to at least 1.0 m/s with LRAD to demonstrate safe ability to complete short community distances. Baseline: 12/13/22: 0.48 m/s with RW;  02/02/23: 0.32 m/s w/ hurry cane; 03/07/23: 0.83m/s w/ hurry cane Goal status: ONGOING  6. Pt will increase 6 MWT by > 165' to display improvements in functional endurance with community ambulation.   Baseline: 01/12/23: 429 ft w/ 7/10 NPS 02/02/23: Defer to next session 03/07/23: Deferred to next session  Goal status: ONGOING   ASSESSMENT:  CLINICAL IMPRESSION:  Pt presenting to PT with pallor and numbness in bilat fingers and toes 2/2 to "cold temperatures". Pt's vitals assessed noting 120/59 mm Hg, BP HR 61. Pallor and decreased sensation diminished following activity. Session focus on progressing ambulation/ turning negotiation with the hurrycane, along with bilat LE strengthening. Pt continues to note deficits with turning towards the R >L with the hurrycane requiring VC's for proper hurrycane management, increased time, and multiple small steps needed to complete transition, which can lead to difficulty  completing functional tasks within the community. Pt notes improvements in bilat LE strength noted with increased activity tolerance with STS from lowered plinth table and increasing medicine ball # used during activity. Pt will continue to benefit from skilled PT interventions to address remaining LE strength, balance and endurance deficits to improve pt's functional mobility w/ LRAD, and return to PLOF.   OBJECTIVE IMPAIRMENTS: Abnormal gait, decreased balance, decreased mobility, difficulty walking, decreased ROM, decreased strength, impaired sensation, and postural dysfunction.   ACTIVITY LIMITATIONS: carrying, lifting, bending, standing, stairs, transfers, reach over head, and locomotion level  PARTICIPATION LIMITATIONS: cleaning, laundry, driving, shopping, and community activity  PERSONAL FACTORS: Age, Past/current experiences, and Time since onset of injury/illness/exacerbation are also affecting patient's functional outcome.   REHAB POTENTIAL: Good  CLINICAL DECISION MAKING: Evolving/moderate complexity  EVALUATION COMPLEXITY: Moderate  PLAN:  PT FREQUENCY: 2x/week  PT DURATION: 12 weeks  PLANNED INTERVENTIONS: Therapeutic exercises, Therapeutic activity, Neuromuscular re-education, Balance training, Gait training, Patient/Family education, Self Care, Stair training, DME instructions, Manual therapy, and Re-evaluation  PLAN FOR NEXT SESSION: Update HEP, Assess tolerance to picking up objects from floor using hurrycane only. Continue gait training w/ LRAD/ no AD and bilat LE strengthening exercises.     Lovie Macadamia, SPT  Delphia Grates. Fairly IV, PT, DPT Physical Therapist- [facility]  03/13/23, 12:27 PM

## 2023-03-14 ENCOUNTER — Other Ambulatory Visit: Payer: Self-pay | Admitting: Psychiatry

## 2023-03-14 ENCOUNTER — Other Ambulatory Visit: Payer: Self-pay | Admitting: Orthopedic Surgery

## 2023-03-14 DIAGNOSIS — G4700 Insomnia, unspecified: Secondary | ICD-10-CM

## 2023-03-14 DIAGNOSIS — M5416 Radiculopathy, lumbar region: Secondary | ICD-10-CM

## 2023-03-14 DIAGNOSIS — Z9889 Other specified postprocedural states: Secondary | ICD-10-CM

## 2023-03-14 NOTE — Telephone Encounter (Signed)
Refill for celebrex okay.   She has been tolerating without any side effects.   Please let her know it is at the pharmacy.

## 2023-03-14 NOTE — Telephone Encounter (Signed)
Patient aware of medication refill.

## 2023-03-15 ENCOUNTER — Ambulatory Visit: Payer: 59

## 2023-03-15 ENCOUNTER — Other Ambulatory Visit: Payer: Self-pay | Admitting: Family Medicine

## 2023-03-15 ENCOUNTER — Telehealth: Payer: Self-pay | Admitting: Psychiatry

## 2023-03-15 DIAGNOSIS — M6281 Muscle weakness (generalized): Secondary | ICD-10-CM | POA: Diagnosis not present

## 2023-03-15 DIAGNOSIS — R262 Difficulty in walking, not elsewhere classified: Secondary | ICD-10-CM

## 2023-03-15 DIAGNOSIS — I10 Essential (primary) hypertension: Secondary | ICD-10-CM

## 2023-03-15 NOTE — Telephone Encounter (Signed)
Ok- noted and thank you  

## 2023-03-15 NOTE — Therapy (Addendum)
OUTPATIENT PHYSICAL THERAPY NEURO TREATMENT  Patient Name: Susan [name] MRN: [id number] DOB:[birth year], 62 y.o., female Today's Date: 03/15/2023   PCP: [name] MD REFERRING PROVIDER: Venetia Night MD  END OF SESSION:  PT End of Session - 03/15/23 0946     Visit Number 19    Number of Visits 25    Date for PT Re-Evaluation 04/18/23    PT Start Time 0946    PT Stop Time 1029    PT Time Calculation (min) 43 min    Equipment Utilized During Treatment Gait belt    Activity Tolerance Patient tolerated treatment well    Behavior During Therapy [facility] for tasks assessed/performed             Past Medical History:  Diagnosis Date   ADD (attention deficit disorder)    Alcohol abuse    Anemia    Anxiety    Aortic atherosclerosis (HCC) 02/20/2018   Chest CT Sept 2019   Arthritis    rheumatoid arthritis   Asthma    Bipolar disorder (HCC)    Centrilobular emphysema (HCC)    Cocaine use disorder, moderate, in sustained remission (HCC)    Constipation    Degenerative disc disease at L5-S1 level 09/28/2016   See ortho note May 2018   Depression    bipolar, hx of suicide attempt   Diabetes mellitus, type 2 (HCC)    Diverticulitis 2013   DOE (dyspnea on exertion)    Drug use    Family history of adverse reaction to anesthesia    mom-delayed emergence   GERD (gastroesophageal reflux disease)    H/O suicide attempt    slit wrists   Hepatitis C 06/26/2014   treated with Harvoni   Hip pain    History of echocardiogram    a. 04/2019 Echo: EF >65%, nl RV fxn.   History of MRSA infection 2013   HLD (hyperlipidemia)    Hypertension    a. 06/2021 Renal Duplex: ? bilat RAS; b. 06/2021 CTA Abd: No signif RAS.   Hyponatremia    Hypothyroidism    Incisional hernia 11/08/2012   Knee pain    Left carotid bruit    Morbid obesity (HCC)    Multinodular thyroid    OSA (obstructive sleep apnea)    OSA on CPAP    Osteoporosis    Palpitations    Post-traumatic  osteoarthritis of right knee 09/24/2015   Recurrent ventral hernia 11/08/2012   Status post total right knee replacement using cement 05/10/2016   Stroke (HCC) 07/05/2022   right arm,torso and right leg numbness   Tobacco use disorder    Vitamin B12 deficiency    Vitamin D deficiency disease    Past Surgical History:  Procedure Laterality Date   BILATERAL SALPINGOOPHORECTOMY     due to abnormal mass   BREAST BIOPSY Left    neg   BREAST SURGERY Left 20 yrs ago   CESAREAN SECTION     COLONOSCOPY     COLONOSCOPY WITH PROPOFOL N/A 10/16/2017   Procedure: COLONOSCOPY WITH PROPOFOL;  Surgeon: Wyline Mood, MD;  Location: [facility] ENDOSCOPY;  Service: Gastroenterology;  Laterality: N/A;   COLONOSCOPY WITH PROPOFOL N/A 02/16/2021   Procedure: COLONOSCOPY WITH PROPOFOL;  Surgeon: Wyline Mood, MD;  Location: [facility] ENDOSCOPY;  Service: Gastroenterology;  Laterality: N/A;   FORAMINOTOMY 1 LEVEL Right 10/10/2022   Procedure: RIGHT L4-5 LAMINOFORAMINOTOMY;  Surgeon: Venetia Night, MD;  Location: [facility] ORS;  Service: Neurosurgery;  Laterality: Right;   HERNIA  REPAIR  06/2011, July 2014   Ventral wall repair with Physiomesh   HERNIA REPAIR     2nd.vental wall repair   JOINT REPLACEMENT Right    knee   LUMBAR LAMINECTOMY/ DECOMPRESSION WITH MET-RX N/A 10/12/2022   Procedure: LUMBAR LAMINECTOMY/ DECOMPRESSION WITH MET-RX;  Surgeon: Venetia Night, MD;  Location: [facility] ORS;  Service: Neurosurgery;  Laterality: N/A;   TONSILLECTOMY     TOTAL HIP ARTHROPLASTY Left 07/23/2019   Procedure: TOTAL HIP ARTHROPLASTY;  Surgeon: Christena Flake, MD;  Location: [facility] ORS;  Service: Orthopedics;  Laterality: Left;   TOTAL KNEE ARTHROPLASTY Right 05/10/2016   Procedure: TOTAL KNEE ARTHROPLASTY;  Surgeon: Christena Flake, MD;  Location: [facility] ORS;  Service: Orthopedics;  Laterality: Right;   TUBAL LIGATION     Patient Active Problem List   Diagnosis Date Noted   Neurogenic bowel 02/13/2023   Neurogenic bladder 02/13/2023    Abnormality of gait and mobility 02/13/2023   Blister of foot without infection, right, initial encounter 01/05/2023   Bipolar 1 disorder, depressed, moderate (HCC) 11/29/2022   Anemia 10/13/2022   Cauda equina syndrome (HCC) 10/12/2022   Epidural hematoma (HCC) 10/12/2022   Mass in epidural space 10/12/2022   Synovial cyst of lumbar facet joint 10/10/2022   Lumbar radiculopathy 10/10/2022   S/P laminectomy 10/10/2022   Epigastric pain 07/22/2022   Enlarged thyroid 07/22/2022   Chronic diarrhea 07/22/2022   CVA (cerebrovascular accident) (HCC) 07/05/2022   Skin lesion 06/28/2022   BMI 38.0-38.9,adult 07/14/2021   Type 2 diabetes mellitus without complication, without long-term current use of insulin (HCC) 06/16/2021   H/O thyroid nodule 05/31/2021   History of hepatitis C- treated 2014- 2015 with [facility] gastroenterology and followed.  05/19/2021   Left carotid bruit 04/28/2021   Anxiety 05/28/2020   Bipolar affective disorder in remission (HCC) 05/28/2020   Bipolar disorder, in full remission, most recent episode mixed (HCC) 03/06/2020   Need for immunization against influenza 01/27/2020   Morbid obesity (HCC) 01/27/2020   Osteoporosis 11/06/2019   Bipolar 1 disorder, mixed, mild (HCC) 10/29/2019   PTSD (post-traumatic stress disorder) 09/27/2019   Bereavement 09/27/2019   Cocaine use disorder, moderate, in sustained remission (HCC) 09/27/2019   Status post total hip replacement, left 07/23/2019   Primary osteoarthritis of left hip 06/14/2019   Depression 10/23/2018   Allergic rhinitis 08/23/2018   Aortic atherosclerosis (HCC) 02/20/2018   Centrilobular emphysema (HCC) 02/20/2018   Hyperlipidemia 03/28/2017   Degenerative disc disease at L5-S1 level 09/28/2016   Tobacco use disorder 04/21/2016   GERD (gastroesophageal reflux disease) 12/17/2015   Chronic back pain 10/09/2015   Post-traumatic osteoarthritis of right knee 09/24/2015   Alcohol use disorder, severe, in sustained  remission (HCC) 04/13/2015   Vitamin D deficiency 04/09/2015   Vitamin B12 deficiency 04/09/2015   OSA on CPAP 04/09/2015   Subclinical hyperthyroidism 06/26/2014   Goiter colloid, toxic, nodular 06/26/2014   Hypertension 06/26/2014   Tobacco use disorder, continuous 06/26/2014   Bipolar I disorder (HCC) 06/26/2014   Lumbar radiculitis 10/03/2013   Diverticulosis 08/24/2013    ONSET DATE: 10/12/22  REFERRING DIAG: A54.098 (ICD-10-CM) - Status post lumbar spine surgery for decompression of spinal cord G83.4 (ICD-10-CM) - Cauda equina syndrome (HCC)  THERAPY DIAG:  Muscle weakness (generalized)  Difficulty in walking, not elsewhere classified  Rationale for Evaluation and Treatment: Rehabilitation  SUBJECTIVE:  SUBJECTIVE STATEMENT:  Pt reports 6/10 LBP at today's session and mild soreness in bilat LE's. No notable changes since last session.   Pt accompanied by: self  PERTINENT HISTORY:  Suhaylah Wampole is a 62 y/o F with PMH: HTN, DM2, HLD, ETOH use, THA, PTSD, cocaine, bipolar, and L thalamic infarct (06/2022), s/p lumbar laminoforaminotomy (10/10/22). Presenting post L3-5 decompression with evacuation of epidural hematoma on 10/12/22 for Cauda Equina Syndrome. Pt has residual RLE deficits after her lumbar surgeries leading to ambulating with RW. She was independent prior to her surgeries now reporting balance and gait difficulties. Has had some Worsening RUE N/T since her CVA in February after her lumbar surgeries. Reports N/T 6-7/10 on severity. Mentions some falls when not using her RW. Feels like she always has to have a hand on something to steady herself.  Pt has residual RLE deficits after her lumbar surgeries leading to ambulating with RW. She was independent prior to her surgeries now reporting  balance and gait difficulties. Has had some Worsening RUE N/T since her CVA in February after her lumbar surgeries. Reports N/T 6-7/10 on severity. Mentions some falls when not using her RW. Feels like she always has to have a hand on something to steady herself. Has had some falls anterior standing too close in the RW, a fall walking up the stairs with the RW. She is able to get herself up. Pt describing some unsafe scenarios with poor RW use with transfers and asc/desc curbs.   PAIN:  Are you having pain? No  PRECAUTIONS: Back  RED FLAGS: Bowel or bladder incontinence: Yes: MD aware since surgery.     WEIGHT BEARING RESTRICTIONS: No  FALLS: Has patient fallen in last 6 months? Yes. Number of falls 3  LIVING ENVIRONMENT: Lives with: lives with their son   PLOF: Independent  PATIENT GOALS: Improve her walking and   OBJECTIVE:   DIAGNOSTIC FINDINGS: CLINICAL DATA:  Elective surgery   EXAM: LUMBAR SPINE - 2-3 VIEW   COMPARISON:  10/10/2022   FINDINGS: Intraoperative fluoroscopy is utilized for surgical control purposes. Fluoroscopy time is recorded at 6 seconds. Dose 5.33 mGy. Two spot fluoroscopic images are obtained. Initial image demonstrates a localization marker posterior to the L4 vertebra. Second image demonstrates a localization marker posterior to the L3-4 interspace level.   IMPRESSION: Intraoperative fluoroscopy utilized for surgical control purposes for lumbar spine localization.     Electronically Signed   By: Burman Nieves M.D.   On: 10/12/2022 20:24  COGNITION: Overall cognitive status: Within functional limits for tasks assessed   SENSATION: Light touch: Impaired  RLE: impaired L2, L4-L5  COORDINATION: Heel to shin normal bilaterally   PROPRIOCEPTION: Normal bilat   POSTURE:  Heavy BUE support on RW with standing tasks with bilateral upper trap use   LUMBAR AROM: Flexion: 75% limited  Extension: 50% limited  Rotation R/L: 50%  limited  Lateral flexion R/L: 50% limited  LOWER EXTREMITY AROM:  L ankle inversion: 20, eversion: 35  R ankle inversion: 15 , eversion: 25    LOWER EXTREMITY MMT:    MMT Right Eval Left Eval  Hip flexion 4- 4  Hip extension    Hip abduction    Hip adduction    Hip internal rotation    Hip external rotation    Knee flexion 4- 4+  Knee extension 4 4+  Ankle dorsiflexion 4- 4+  Ankle plantarflexion    Ankle inversion 3 4+  Ankle eversion 3 4-  (Blank rows =  not tested)  TRANSFERS: Assistive device utilized: Environmental consultant - 2 wheeled  Sit to stand: Modified independence Stand to sit: Modified independence  *poor hand placement keeping BUE's on RW for STS and stand to sit despite VC's and education for safe hand placement.   GAIT: Gait pattern: step through pattern, decreased stance time- Right, decreased ankle dorsiflexion- Right, and poor foot clearance- Right. Notable R ankle inversion in stance phase due to evertor weakness Distance walked: 10 meters Assistive device utilized: Walker - 2 wheeled Level of assistance: Modified independence Comments: Heavy BUE support on RW. PT adjusting RW height to reduced B shoulder shrug.   FUNCTIONAL TESTS:  5 times sit to stand: 29.37 seconds Timed up and go (TUG): 26.89 seconds 10 meter walk test: 0.48 m/s with RW : 470ft PATIENT SURVEYS:  FOTO 37 with target score of 43  TODAY'S TREATMENT: DATE: 03/15/23  Nustep x5 minutes Lvl 3.0 seat #7   There- act:  Functional training to support pt's moving tasks lifting 9.5# box with 10# AW from 11.5" step on top of wedge turning 90 deg placing on elevated plinth table x10 (VC's for proper bilat LE negotiation, mod carryover)  Functional training to support pt's moving tasks lifting 9.5# box with 5# AW from 11.5" step on top of wedge turning 90 deg placing on elevated plinth table 2x10 (VC's for proper bilat LE negotiation, mod carryover)  Squats while lifting 9.5# box with 5# AW  onto/ off of 8" step 3x8   Neuro re-ed:  STS to ambulation CGA with hurry cane in RUE 2 x75' down and back. VC's for progressing to step through pattern. Good foot clearance bilaterally, decreased retrolean noted.   Stepping ascending/ descending 4" aerobic step w/ 1 UE support and hurrycane in RUE (multimodal cueing needed for proper steppage pattern w/ LLE first and hurry cane positioning) MinA needed to assist with stepping up onto step. 2 x10 ascending / descending  PATIENT EDUCATION: Education details: HEP, safe use of AD, POC Person educated: Patient Education method: Explanation, Demonstration, Verbal cues, and Handouts Education comprehension: verbalized understanding and needs further education  HOME EXERCISE PROGRAM: Access Code: [email or web address] URL: [email or web address] Date: 01/24/2023 Prepared by: Ronnie Derby  Exercises - Seated Scapular Retraction  - 1 x daily - 7 x weekly - 3 sets - 15 reps - Seated Ankle Circles  - 1 x daily - 7 x weekly - 3 sets - 15 reps - Seated Heel Toe Raises  - 1 x daily - 4-5 x weekly - 3 sets - 10 reps - Sit to Stand with Counter Support  - 1 x daily - 4-5 x weekly - 3 sets - 8 reps   Access Code: [email or web address] URL: [email or web address] Date: 12/13/2022 Prepared by: Ronnie Derby  Exercises - Supine Bridge  - 1 x daily - 7 x weekly - 3 sets - 6 reps - Seated Scapular Retraction  - 1 x daily - 7 x weekly - 3 sets - 15 reps - Seated Ankle Circles  - 1 x daily - 7 x weekly - 3 sets - 15 reps  GOALS: Goals reviewed with patient? No  SHORT TERM GOALS: Target date: 12/24/22  Pt will be independent with HEP to improve RLE strength for transfers and gait Baseline: 12/13/22: Provided initial HEP 02/02/23: HEP compliant Goal status: MET  2.  Pt will demonstrate safe use of hand placement with transfers with LRAD to demonstrate reduced falls risk. Baseline: 12/13/22: BUE use on AD with STS and stand  to sit transfers 02/02/23:  Correct hand placement noted  Goal status: MET  LONG TERM GOALS: Target date: 04/18/23  Pt will improve FOTO to target score to demonstrate clinically significant improvement in functional mobility.  Baseline: 12/13/22: 37/43; 02/02/23: 43/43 03/07/23 36/43 (answered questions based on not using RW) Goal status: MET  2.  Pt will complete TUG in 12 seconds or less with LRAD to demonstrate reduced falls risk with transfers and short distance gait Baseline: 12/13/22: 26.89 seconds w/ RW, 01/24/23: 44.20 seconds w/ 3 pronged hurry cane 02/02/23: 31.05sec with hurry cane 03/07/23: 19.60 with hurrycane Goal status: ONGOING  3.  Pt will improve 5xSTS to 11.4 seconds or less without UE support to demonstrate improved LE strength to match age based norms.  Baseline: 12/13/22: 29.37 seconds 02/02/23: 20.20 seconds 03/07/23: 13.38 seconds  Goal status: ONGOING  4.  Pt will improve FGA by at least 4 points to demonstrate clinically significant improvement in balance with community activities.  Baseline: 12/13/22: deferred to next session; 12/29/22: 13/30; 02/02/23:14/30; 03/07/23: 9/30 w/o AD Goal status: ONGOING  5.  Pt will improve gait speed to at least 1.0 m/s with LRAD to demonstrate safe ability to complete short community distances. Baseline: 12/13/22: 0.48 m/s with RW;  02/02/23: 0.32 m/s w/ hurry cane; 03/07/23: 0.18m/s w/ hurry cane Goal status: ONGOING  6. Pt will increase 6 MWT by > 165' to display improvements in functional endurance with community ambulation.   Baseline: 01/12/23: 429 ft w/ 7/10 NPS 02/02/23: Defer to next session 03/07/23: Deferred to next session  Goal status: ONGOING   ASSESSMENT:  CLINICAL IMPRESSION:  Session focused on functional mobility training as pt is preparing to move homes and expressed deficits with maneuvering moving boxes. Pt reports improved bilat LE strength with activity, though still experiences difficulty with proper stepping mechanics of bilat LE's, requiring  VC's to prevent excessive lumbar rotation. Pt demonstrated improved ambulation with hurrycane, showing a decrease in posterior truncal lean and increased efficiency with turning. However, pt continues to have difficulty with ambulation over a 4" aerobic step, requiring assistance with stepping onto the step using hurrycane and LLE MinA and HHA for task completion. Pt will continue to work on improving step-over ability and proper stepping mechanics for safer mobility in home environment. Pt will continue to benefit from skilled PT interventions to address remaining LE strength, balance and endurance deficits to improve pt's functional mobility w/ LRAD, and return to PLOF.  OBJECTIVE IMPAIRMENTS: Abnormal gait, decreased balance, decreased mobility, difficulty walking, decreased ROM, decreased strength, impaired sensation, and postural dysfunction.   ACTIVITY LIMITATIONS: carrying, lifting, bending, standing, stairs, transfers, reach over head, and locomotion level  PARTICIPATION LIMITATIONS: cleaning, laundry, driving, shopping, and community activity  PERSONAL FACTORS: Age, Past/current experiences, and Time since onset of injury/illness/exacerbation are also affecting patient's functional outcome.   REHAB POTENTIAL: Good  CLINICAL DECISION MAKING: Evolving/moderate complexity  EVALUATION COMPLEXITY: Moderate  PLAN:  PT FREQUENCY: 2x/week  PT DURATION: 12 weeks  PLANNED INTERVENTIONS: Therapeutic exercises, Therapeutic activity, Neuromuscular re-education, Balance training, Gait training, Patient/Family education, Self Care, Stair training, DME instructions, Manual therapy, and Re-evaluation  PLAN FOR NEXT SESSION: Progress note, Update HEP, Continue gait training w/ LRAD/ no AD and bilat LE strengthening exercises.     Lovie Macadamia, SPT  Delphia Grates. Fairly IV, PT, DPT Physical Therapist- [place]  [facility]  03/15/23, 3:11 PM

## 2023-03-15 NOTE — Telephone Encounter (Signed)
 I do not see an appointment scheduled for this patient.

## 2023-03-22 ENCOUNTER — Ambulatory Visit: Payer: 59

## 2023-03-24 ENCOUNTER — Ambulatory Visit: Payer: 59

## 2023-03-24 DIAGNOSIS — M6281 Muscle weakness (generalized): Secondary | ICD-10-CM | POA: Diagnosis not present

## 2023-03-24 DIAGNOSIS — R262 Difficulty in walking, not elsewhere classified: Secondary | ICD-10-CM | POA: Diagnosis not present

## 2023-03-24 NOTE — Therapy (Signed)
OUTPATIENT PHYSICAL THERAPY NEURO TREATMENT/ PROGRESS NOTE Dates: 12/13/22-03/24/23 Patient Name: Susan [name] MRN: [id number] DOB:December 03, 1960, 62 y.o., female Today's Date: 03/24/2023   PCP: [name] MD REFERRING PROVIDER: Venetia Night MD  END OF SESSION:  PT End of Session - 03/24/23 1114     Visit Number 20    Number of Visits 25    Date for PT Re-Evaluation 04/18/23    PT Start Time 1115    PT Stop Time 1156    PT Time Calculation (min) 41 min    Equipment Utilized During Treatment Gait belt    Activity Tolerance Patient tolerated treatment well    Behavior During Therapy [facility] for tasks assessed/performed             Past Medical History:  Diagnosis Date   ADD (attention deficit disorder)    Alcohol abuse    Anemia    Anxiety    Aortic atherosclerosis (HCC) 02/20/2018   Chest CT Sept 2019   Arthritis    rheumatoid arthritis   Asthma    Bipolar disorder (HCC)    Centrilobular emphysema (HCC)    Cocaine use disorder, moderate, in sustained remission (HCC)    Constipation    Degenerative disc disease at L5-S1 level 09/28/2016   See ortho note May 2018   Depression    bipolar, hx of suicide attempt   Diabetes mellitus, type 2 (HCC)    Diverticulitis 2013   DOE (dyspnea on exertion)    Drug use    Family history of adverse reaction to anesthesia    mom-delayed emergence   GERD (gastroesophageal reflux disease)    H/O suicide attempt    slit wrists   Hepatitis C 06/26/2014   treated with Harvoni   Hip pain    History of echocardiogram    a. 04/2019 Echo: EF >65%, nl RV fxn.   History of MRSA infection 2013   HLD (hyperlipidemia)    Hypertension    a. 06/2021 Renal Duplex: ? bilat RAS; b. 06/2021 CTA Abd: No signif RAS.   Hyponatremia    Hypothyroidism    Incisional hernia 11/08/2012   Knee pain    Left carotid bruit    Morbid obesity (HCC)    Multinodular thyroid    OSA (obstructive sleep apnea)    OSA on CPAP    Osteoporosis     Palpitations    Post-traumatic osteoarthritis of right knee 09/24/2015   Recurrent ventral hernia 11/08/2012   Status post total right knee replacement using cement 05/10/2016   Stroke (HCC) 07/05/2022   right arm,torso and right leg numbness   Tobacco use disorder    Vitamin B12 deficiency    Vitamin D deficiency disease    Past Surgical History:  Procedure Laterality Date   BILATERAL SALPINGOOPHORECTOMY     due to abnormal mass   BREAST BIOPSY Left    neg   BREAST SURGERY Left 20 yrs ago   CESAREAN SECTION     COLONOSCOPY     COLONOSCOPY WITH PROPOFOL N/A 10/16/2017   Procedure: COLONOSCOPY WITH PROPOFOL;  Surgeon: Wyline Mood, MD;  Location: [facility] ENDOSCOPY;  Service: Gastroenterology;  Laterality: N/A;   COLONOSCOPY WITH PROPOFOL N/A 02/16/2021   Procedure: COLONOSCOPY WITH PROPOFOL;  Surgeon: Wyline Mood, MD;  Location: [facility] ENDOSCOPY;  Service: Gastroenterology;  Laterality: N/A;   FORAMINOTOMY 1 LEVEL Right 10/10/2022   Procedure: RIGHT L4-5 LAMINOFORAMINOTOMY;  Surgeon: Venetia Night, MD;  Location: [facility] ORS;  Service: Neurosurgery;  Laterality: Right;  HERNIA REPAIR  06/2011, July 2014   Ventral wall repair with Physiomesh   HERNIA REPAIR     2nd.vental wall repair   JOINT REPLACEMENT Right    knee   LUMBAR LAMINECTOMY/ DECOMPRESSION WITH MET-RX N/A 10/12/2022   Procedure: LUMBAR LAMINECTOMY/ DECOMPRESSION WITH MET-RX;  Surgeon: Venetia Night, MD;  Location: [facility] ORS;  Service: Neurosurgery;  Laterality: N/A;   TONSILLECTOMY     TOTAL HIP ARTHROPLASTY Left 07/23/2019   Procedure: TOTAL HIP ARTHROPLASTY;  Surgeon: Christena Flake, MD;  Location: [facility] ORS;  Service: Orthopedics;  Laterality: Left;   TOTAL KNEE ARTHROPLASTY Right 05/10/2016   Procedure: TOTAL KNEE ARTHROPLASTY;  Surgeon: Christena Flake, MD;  Location: [facility] ORS;  Service: Orthopedics;  Laterality: Right;   TUBAL LIGATION     Patient Active Problem List   Diagnosis Date Noted   Neurogenic bowel 02/13/2023    Neurogenic bladder 02/13/2023   Abnormality of gait and mobility 02/13/2023   Blister of foot without infection, right, initial encounter 01/05/2023   Bipolar 1 disorder, depressed, moderate (HCC) 11/29/2022   Anemia 10/13/2022   Cauda equina syndrome (HCC) 10/12/2022   Epidural hematoma (HCC) 10/12/2022   Mass in epidural space 10/12/2022   Synovial cyst of lumbar facet joint 10/10/2022   Lumbar radiculopathy 10/10/2022   S/P laminectomy 10/10/2022   Epigastric pain 07/22/2022   Enlarged thyroid 07/22/2022   Chronic diarrhea 07/22/2022   CVA (cerebrovascular accident) (HCC) 07/05/2022   Skin lesion 06/28/2022   BMI 38.0-38.9,adult 07/14/2021   Type 2 diabetes mellitus without complication, without long-term current use of insulin (HCC) 06/16/2021   H/O thyroid nodule 05/31/2021   History of hepatitis C- treated 2014- 2015 with [facility] gastroenterology and followed.  05/19/2021   Left carotid bruit 04/28/2021   Anxiety 05/28/2020   Bipolar affective disorder in remission (HCC) 05/28/2020   Bipolar disorder, in full remission, most recent episode mixed (HCC) 03/06/2020   Need for immunization against influenza 01/27/2020   Morbid obesity (HCC) 01/27/2020   Osteoporosis 11/06/2019   Bipolar 1 disorder, mixed, mild (HCC) 10/29/2019   PTSD (post-traumatic stress disorder) 09/27/2019   Bereavement 09/27/2019   Cocaine use disorder, moderate, in sustained remission (HCC) 09/27/2019   Status post total hip replacement, left 07/23/2019   Primary osteoarthritis of left hip 06/14/2019   Depression 10/23/2018   Allergic rhinitis 08/23/2018   Aortic atherosclerosis (HCC) 02/20/2018   Centrilobular emphysema (HCC) 02/20/2018   Hyperlipidemia 03/28/2017   Degenerative disc disease at L5-S1 level 09/28/2016   Tobacco use disorder 04/21/2016   GERD (gastroesophageal reflux disease) 12/17/2015   Chronic back pain 10/09/2015   Post-traumatic osteoarthritis of right knee 09/24/2015   Alcohol  use disorder, severe, in sustained remission (HCC) 04/13/2015   Vitamin D deficiency 04/09/2015   Vitamin B12 deficiency 04/09/2015   OSA on CPAP 04/09/2015   Subclinical hyperthyroidism 06/26/2014   Goiter colloid, toxic, nodular 06/26/2014   Hypertension 06/26/2014   Tobacco use disorder, continuous 06/26/2014   Bipolar I disorder (HCC) 06/26/2014   Lumbar radiculitis 10/03/2013   Diverticulosis 08/24/2013    ONSET DATE: 10/12/22  REFERRING DIAG: Q46.962 (ICD-10-CM) - Status post lumbar spine surgery for decompression of spinal cord G83.4 (ICD-10-CM) - Cauda equina syndrome (HCC)  THERAPY DIAG:  Muscle weakness (generalized)  Difficulty in walking, not elsewhere classified  Rationale for Evaluation and Treatment: Rehabilitation  SUBJECTIVE:  SUBJECTIVE STATEMENT:  Pt reports 6/10 LBP at today's session. She reports that her legs are tired today, and she missed her last appt 2/2 being sick.   Pt accompanied by: self  PERTINENT HISTORY:  Deundra Hullum is a 62 y/o F with PMH: HTN, DM2, HLD, ETOH use, THA, PTSD, cocaine, bipolar, and L thalamic infarct (06/2022), s/p lumbar laminoforaminotomy (10/10/22). Presenting post L3-5 decompression with evacuation of epidural hematoma on 10/12/22 for Cauda Equina Syndrome. Pt has residual RLE deficits after her lumbar surgeries leading to ambulating with RW. She was independent prior to her surgeries now reporting balance and gait difficulties. Has had some Worsening RUE N/T since her CVA in February after her lumbar surgeries. Reports N/T 6-7/10 on severity. Mentions some falls when not using her RW. Feels like she always has to have a hand on something to steady herself.  Pt has residual RLE deficits after her lumbar surgeries leading to ambulating with RW. She was  independent prior to her surgeries now reporting balance and gait difficulties. Has had some Worsening RUE N/T since her CVA in February after her lumbar surgeries. Reports N/T 6-7/10 on severity. Mentions some falls when not using her RW. Feels like she always has to have a hand on something to steady herself. Has had some falls anterior standing too close in the RW, a fall walking up the stairs with the RW. She is able to get herself up. Pt describing some unsafe scenarios with poor RW use with transfers and asc/desc curbs.   PAIN:  Are you having pain? No  PRECAUTIONS: Back  RED FLAGS: Bowel or bladder incontinence: Yes: MD aware since surgery.     WEIGHT BEARING RESTRICTIONS: No  FALLS: Has patient fallen in last 6 months? Yes. Number of falls 3  LIVING ENVIRONMENT: Lives with: lives with their son   PLOF: Independent  PATIENT GOALS: Improve her walking and   OBJECTIVE:   DIAGNOSTIC FINDINGS: CLINICAL DATA:  Elective surgery   EXAM: LUMBAR SPINE - 2-3 VIEW   COMPARISON:  10/10/2022   FINDINGS: Intraoperative fluoroscopy is utilized for surgical control purposes. Fluoroscopy time is recorded at 6 seconds. Dose 5.33 mGy. Two spot fluoroscopic images are obtained. Initial image demonstrates a localization marker posterior to the L4 vertebra. Second image demonstrates a localization marker posterior to the L3-4 interspace level.   IMPRESSION: Intraoperative fluoroscopy utilized for surgical control purposes for lumbar spine localization.     Electronically Signed   By: Burman Nieves M.D.   On: 10/12/2022 20:24  COGNITION: Overall cognitive status: Within functional limits for tasks assessed   SENSATION: Light touch: Impaired  RLE: impaired L2, L4-L5  COORDINATION: Heel to shin normal bilaterally   PROPRIOCEPTION: Normal bilat   POSTURE:  Heavy BUE support on RW with standing tasks with bilateral upper trap use   LUMBAR AROM: Flexion: 75% limited   Extension: 50% limited  Rotation R/L: 50% limited  Lateral flexion R/L: 50% limited  LOWER EXTREMITY AROM:  L ankle inversion: 20, eversion: 35  R ankle inversion: 15 , eversion: 25    LOWER EXTREMITY MMT:    MMT Right Eval Left Eval  Hip flexion 4- 4  Hip extension    Hip abduction    Hip adduction    Hip internal rotation    Hip external rotation    Knee flexion 4- 4+  Knee extension 4 4+  Ankle dorsiflexion 4- 4+  Ankle plantarflexion    Ankle inversion 3 4+  Ankle eversion 3  4-  (Blank rows = not tested)  TRANSFERS: Assistive device utilized: Environmental consultant - 2 wheeled  Sit to stand: Modified independence Stand to sit: Modified independence  *poor hand placement keeping BUE's on RW for STS and stand to sit despite VC's and education for safe hand placement.   GAIT: Gait pattern: step through pattern, decreased stance time- Right, decreased ankle dorsiflexion- Right, and poor foot clearance- Right. Notable R ankle inversion in stance phase due to evertor weakness Distance walked: 10 meters Assistive device utilized: Walker - 2 wheeled Level of assistance: Modified independence Comments: Heavy BUE support on RW. PT adjusting RW height to reduced B shoulder shrug.   FUNCTIONAL TESTS:  5 times sit to stand: 29.37 seconds Timed up and go (TUG): 26.89 seconds 10 meter walk test: 0.48 m/s with RW : 454ft PATIENT SURVEYS:  FOTO 37 with target score of 43  TODAY'S TREATMENT: DATE: 03/24/23  Beginning of session spent reviewing goals a pt requiring progress note. See clinical impression and goals section for details.    There- ex:  Nustep x5 minutes Lvl 3.0 seat #7 for LE strengthening and lumbar mobility STS with 2 KG med ball overhead raises for dynamic balance perturbation and lumbar extension strength: 2x6 Squats while lifting 9.5# box with 5# AW onto/ off of 8" step 2x8  Alternating toe taps onto 8" step RLE/LLE w/ hurrycane in LUE and HHA w/ RUE x6/ each  side  PATIENT EDUCATION: Education details: HEP, safe use of AD, POC Person educated: Patient Education method: Programmer, multimedia, Demonstration, Verbal cues, and Handouts Education comprehension: verbalized understanding and needs further education  HOME EXERCISE PROGRAM: Access Code: [email or web address] URL: [email or web address] Date: 01/24/2023 Prepared by: Ronnie Derby  Exercises - Seated Scapular Retraction  - 1 x daily - 7 x weekly - 3 sets - 15 reps - Seated Ankle Circles  - 1 x daily - 7 x weekly - 3 sets - 15 reps - Seated Heel Toe Raises  - 1 x daily - 4-5 x weekly - 3 sets - 10 reps - Sit to Stand with Counter Support  - 1 x daily - 4-5 x weekly - 3 sets - 8 reps   Access Code: [email or web address] URL: [email or web address] Date: 12/13/2022 Prepared by: Ronnie Derby  Exercises - Supine Bridge  - 1 x daily - 7 x weekly - 3 sets - 6 reps - Seated Scapular Retraction  - 1 x daily - 7 x weekly - 3 sets - 15 reps - Seated Ankle Circles  - 1 x daily - 7 x weekly - 3 sets - 15 reps  GOALS: Goals reviewed with patient? No  SHORT TERM GOALS: Target date: 12/24/22  Pt will be independent with HEP to improve RLE strength for transfers and gait Baseline: 12/13/22: Provided initial HEP 02/02/23: HEP compliant Goal status: MET  2.  Pt will demonstrate safe use of hand placement with transfers with LRAD to demonstrate reduced falls risk. Baseline: 12/13/22: BUE use on AD with STS and stand to sit transfers 02/02/23: Correct hand placement noted  Goal status: MET  LONG TERM GOALS: Target date: 04/18/23  Pt will improve FOTO to target score to demonstrate clinically significant improvement in functional mobility.  Baseline: 12/13/22: 37/43; 02/02/23: 43/43 03/07/23 36/43 (answered questions based on not using RW) 38/43+ Goal status: MET  2.  Pt will complete TUG in 12 seconds or less with LRAD to demonstrate reduced falls risk with transfers and short distance gait Baseline:  12/13/22: 26.89 seconds w/ RW,  01/24/23: 44.20 seconds w/ 3 pronged hurry cane 02/02/23: 31.05sec with hurry cane 03/07/23: 19.60 with hurrycane Goal status: ONGOING  3.  Pt will improve 5xSTS to 11.4 seconds or less without UE support to demonstrate improved LE strength to match age based norms.  Baseline: 12/13/22: 29.37 seconds 02/02/23: 20.20 seconds 03/07/23: 13.38 seconds  Goal status: ONGOING  4.  Pt will improve FGA by at least 4 points to demonstrate clinically significant improvement in balance with community activities.  Baseline: 12/13/22: deferred to next session; 12/29/22: 13/30; 02/02/23:14/30; 03/07/23: 9/30 w/o AD Goal status: ONGOING  5.  Pt will improve gait speed to at least 1.0 m/s with LRAD to demonstrate safe ability to complete short community distances. Baseline: 12/13/22: 0.48 m/s with RW;  02/02/23: 0.32 m/s w/ hurry cane; 03/07/23: 0.42m/s w/ hurry cane Goal status: ONGOING  6. Pt will increase 6 MWT by > 165' to display improvements in functional endurance with community ambulation.   Baseline: 01/12/23: 429 ft w/ 7/10 NPS 02/02/23: Defer to next session 03/07/23: Deferred to next session 03/24/23: 251ft w/ HURRYCANE   Goal status: ONGOING   ASSESSMENT:  CLINICAL IMPRESSION: Session focused on bilat LE strengthening and administering the as pt requiring progress note for 20th visit. Re-certification performed on 10/29, reassessing pt's STG and LTG (see note for further goal progression). Pt able to complete the using only the hurrycane, ambulating 242ft with CGA; multiple rest breaks required, but this represents significant improvement from previously requiring a RW to complete the assessment. Pt reports improvements in bilat LE strength, with increased activity tolerance noted during today's session. Pt will continue to benefit from skilled PT interventions to address remaining LE strength, balance and endurance deficits to improve pt's functional mobility w/ LRAD, and  return to PLOF.  OBJECTIVE IMPAIRMENTS: Abnormal gait, decreased balance, decreased mobility, difficulty walking, decreased ROM, decreased strength, impaired sensation, and postural dysfunction.   ACTIVITY LIMITATIONS: carrying, lifting, bending, standing, stairs, transfers, reach over head, and locomotion level  PARTICIPATION LIMITATIONS: cleaning, laundry, driving, shopping, and community activity  PERSONAL FACTORS: Age, Past/current experiences, and Time since onset of injury/illness/exacerbation are also affecting patient's functional outcome.   REHAB POTENTIAL: Good  CLINICAL DECISION MAKING: Evolving/moderate complexity  EVALUATION COMPLEXITY: Moderate  PLAN:  PT FREQUENCY: 2x/week  PT DURATION: 12 weeks  PLANNED INTERVENTIONS: Therapeutic exercises, Therapeutic activity, Neuromuscular re-education, Balance training, Gait training, Patient/Family education, Self Care, Stair training, DME instructions, Manual therapy, and Re-evaluation  PLAN FOR NEXT SESSION: Update HEP, Continue gait training w/ LRAD/ no AD and bilat LE strengthening exercises.     Lovie Macadamia, SPT  03/24/23, 12:11 PM

## 2023-03-28 ENCOUNTER — Ambulatory Visit (INDEPENDENT_AMBULATORY_CARE_PROVIDER_SITE_OTHER): Payer: 59 | Admitting: Family Medicine

## 2023-03-28 ENCOUNTER — Encounter: Payer: Self-pay | Admitting: Family Medicine

## 2023-03-28 VITALS — BP 120/68 | Temp 97.8°F | Ht 61.0 in | Wt 231.4 lb

## 2023-03-28 DIAGNOSIS — Z7985 Long-term (current) use of injectable non-insulin antidiabetic drugs: Secondary | ICD-10-CM | POA: Diagnosis not present

## 2023-03-28 DIAGNOSIS — I10 Essential (primary) hypertension: Secondary | ICD-10-CM

## 2023-03-28 DIAGNOSIS — E119 Type 2 diabetes mellitus without complications: Secondary | ICD-10-CM

## 2023-03-28 DIAGNOSIS — Z23 Encounter for immunization: Secondary | ICD-10-CM

## 2023-03-28 DIAGNOSIS — F3289 Other specified depressive episodes: Secondary | ICD-10-CM | POA: Diagnosis not present

## 2023-03-28 LAB — BASIC METABOLIC PANEL
BUN: 22 mg/dL (ref 6–23)
CO2: 22 meq/L (ref 19–32)
Calcium: 9.1 mg/dL (ref 8.4–10.5)
Chloride: 103 meq/L (ref 96–112)
Creatinine, Ser: 1.48 mg/dL — ABNORMAL HIGH (ref 0.40–1.20)
GFR: 37.81 mL/min — ABNORMAL LOW (ref >60.00)
Glucose, Bld: 79 mg/dL (ref 70–99)
Potassium: 4.5 meq/L (ref 3.5–5.1)
Sodium: 135 meq/L (ref 135–145)

## 2023-03-28 LAB — POCT GLYCOSYLATED HEMOGLOBIN (HGB A1C): Hemoglobin A1C: 5.4 % (ref 4.0–5.6)

## 2023-03-28 LAB — TSH: TSH: 1.06 u[IU]/mL (ref 0.35–5.50)

## 2023-03-28 MED ORDER — SEMAGLUTIDE (2 MG/DOSE) 8 MG/3ML ~~LOC~~ SOPN: 2.0000 mg | PEN_INJECTOR | SUBCUTANEOUS | 2 refills | Status: DC

## 2023-03-28 MED ORDER — SEMAGLUTIDE (2 MG/DOSE) 8 MG/3ML ~~LOC~~ SOPN
2.0000 mg | PEN_INJECTOR | SUBCUTANEOUS | 2 refills | Status: DC
Start: 2023-03-28 — End: 2023-03-28

## 2023-03-28 NOTE — Assessment & Plan Note (Signed)
Chronic issue.  Improving some.  She will continue Cymbalta 50 mg daily.  She will continue to see psychiatry.

## 2023-03-28 NOTE — Progress Notes (Signed)
[name], MD Phone: [phone]  Susan [name] is a 62 y.o. female who presents today for follow-up.  Diabetes: Not checking sugars.  She taking Ozempic 1 mg weekly.  She does note some polydipsia.  No hypoglycemia.  She is due to see ophthalmology.  Hypertension: Not checking blood pressures at home.  She is taking spironolactone, telmisartan, and carvedilol.  No chest pain, shortness of breath, or edema.  She notes some depression.  She feels as though this is improving.  Being active with PT has been helpful.  She notes no SI.  Obesity: Patient notes her diet has been terrible.  She is eating lots of sweets.  She does have sweet tea though notes she only puts quarter cup of sugar in a gallon.  No fried foods.  Does eat some junk food.  Exercise is limited though she does try to walk around the house.  Patient is moving to [place] and will be there at least 6 months.  Social History   Tobacco Use  Smoking Status Every Day   Current packs/day: 0.50   Average packs/day: 0.5 packs/day for 41.0 years (20.5 ttl pk-yrs)   Types: Cigarettes  Smokeless Tobacco Never    Current Outpatient Medications on File Prior to Visit  Medication Sig Dispense Refill   acetaminophen (TYLENOL) 500 MG tablet Take 1,000 mg by mouth 2 (two) times daily.     albuterol (VENTOLIN HFA) 108 (90 Base) MCG/ACT inhaler Inhale 2 puffs into the lungs every 6 (six) hours as needed for wheezing or shortness of breath. 8 g 2   aspirin EC 81 MG tablet Take by mouth.     bisacodyl (MAGIC BULLETS) 10 MG suppository Place 1 suppository (10 mg total) rectally as needed for moderate constipation (if no bowel movement in the morning with colace). 12 suppository 0   carvedilol (COREG) 12.5 MG tablet Take 12.5 mg by mouth 2 (two) times daily.     celecoxib (CELEBREX) 100 MG capsule TAKE 1 CAPSULE(100 MG) BY MOUTH TWICE DAILY 60 capsule 0   clotrimazole (LOTRIMIN) 1 % cream Apply 1 application  topically 2 (two)  times daily as needed.     cyclobenzaprine (FLEXERIL) 5 MG tablet Take 5 mg by mouth 2 (two) times daily.     docusate sodium (COLACE) 100 MG capsule Take 1 capsule (100 mg total) by mouth daily. Take with breakfast 30 capsule 5   DULoxetine (CYMBALTA) 20 MG capsule TAKE ONE CAPSULE BY MOUTH DAILY WITH 30 MG 30 capsule 5   DULoxetine (CYMBALTA) 30 MG capsule TAKE ONE CAPSULE BY MOUTH DAILY WITH 20 MG CAPSULE 30 capsule 5   gabapentin (NEURONTIN) 300 MG capsule Take 600 mg by mouth.     HYDROcodone-acetaminophen (NORCO/VICODIN) 5-325 MG tablet Take 1 tablet by mouth daily as needed for moderate pain.     lurasidone (LATUDA) 20 MG TABS tablet Take 1 tablet (20 mg total) by mouth daily with supper. 30 tablet 3   naloxone (NARCAN) nasal spray 4 mg/0.1 mL Place into the nose.     rosuvastatin (CRESTOR) 20 MG tablet Take 1 tablet (20 mg total) by mouth daily. 90 tablet 3   spironolactone (ALDACTONE) 25 MG tablet TAKE ONE TABLET BY MOUTH DAILY 90 tablet 1   sulfamethoxazole-trimethoprim (BACTRIM DS) 800-160 MG tablet Take 1 tablet by mouth every 12 (twelve) hours. 14 tablet 0   telmisartan (MICARDIS) 80 MG tablet TAKE ONE TABLET BY MOUTH DAILY 90 tablet 0   traZODone (DESYREL) 50 MG  tablet TAKE 1 TABLET(50 MG) BY MOUTH AT BEDTIME 30 tablet 2   Vibegron (GEMTESA) 75 MG TABS Take 1 tablet (75 mg total) by mouth daily. 30 tablet 5   Vitamin D, Ergocalciferol, (DRISDOL) 1.25 MG (50000 UNIT) CAPS capsule TAKE ONE CAPSULE BY MOUTH ONCE WEEKLY ON SUNDAY 13 capsule 1   pregabalin (LYRICA) 75 MG capsule Take 2 capsules (150 mg total) by mouth 2 (two) times daily. (Patient not taking: Reported on 03/28/2023) 120 capsule 1   No current facility-administered medications on file prior to visit.     ROS see history of present illness  Objective  Physical Exam Vitals:   03/28/23 0915  BP: 120/68  Temp: 97.8 F (36.6 C)    BP Readings from Last 3 Encounters:  03/28/23 120/68  03/01/23 131/81  02/13/23  112/64   Wt Readings from Last 3 Encounters:  03/28/23 231 lb 6.4 oz (105 kg)  03/01/23 223 lb (101.2 kg)  02/13/23 223 lb (101.2 kg)    Physical Exam Constitutional:      General: She is not in acute distress.    Appearance: She is not diaphoretic.  Cardiovascular:     Rate and Rhythm: Normal rate and regular rhythm.     Heart sounds: Normal heart sounds.  Pulmonary:     Effort: Pulmonary effort is normal.     Breath sounds: Normal breath sounds.  Skin:    General: Skin is warm and dry.  Neurological:     Mental Status: She is alert.      Assessment/Plan: Please see individual problem list.  Type 2 diabetes mellitus without complication, without long-term current use of insulin (HCC) Assessment & Plan: Chronic issue.  A1c is very well-controlled.  Will increase Ozempic to 2 mg weekly to help with weight loss.  Patient will monitor for excessive nausea and she will also monitor for abdominal pain.  If those occur she will let us know right away.  If the abdominal pain occurs she will discontinue the Ozempic.  Orders: -     POCT glycosylated hemoglobin (Hb A1C) -     Semaglutide (2 MG/DOSE); Inject 2 mg as directed once a week.  Dispense: 3 mL; Refill: 2  Primary hypertension Assessment & Plan: Chronic issue.  Adequately controlled.  Patient will continue carvedilol 12.5 mg twice daily, spironolactone 25 mg daily, and telmisartan 80 mg daily.  Orders: -     TSH -     Basic metabolic panel  Other depression Assessment & Plan: Chronic issue.  Improving some.  She will continue Cymbalta 50 mg daily.  She will continue to see psychiatry.   Morbid obesity (HCC) Assessment & Plan: Chronic issue.  Encouraged reduction and sweet intake.  Encouraged remaining as active as she is able to given her limitations.  We are increasing Ozempic to 2 mg to see if that helps with weight loss.   Encounter for administration of vaccine -     Flu vaccine trivalent PF, 6mos and  older(Flulaval,Afluria,Fluarix,Fluzone)     Health Maintenance: Patient will discuss getting mammogram and colonoscopy through new PCP in [place] once she moves there.  Return for Patient will call to schedule follow-up once she has returned from [place].   [name], MD [facility]

## 2023-03-28 NOTE — Assessment & Plan Note (Signed)
Chronic issue.  Encouraged reduction and sweet intake.  Encouraged remaining as active as she is able to given her limitations.  We are increasing Ozempic to 2 mg to see if that helps with weight loss.

## 2023-03-28 NOTE — Patient Instructions (Signed)
Nice to see you. If you develop nausea with the Ozempic please let us know.  If you have abdominal pain with the Ozempic please discontinue use of it and let us know. You need to discuss getting mammogram and colonoscopy with your PCP in [place].

## 2023-03-28 NOTE — Assessment & Plan Note (Addendum)
Chronic issue.  A1c is very well-controlled.  Will increase Ozempic to 2 mg weekly to help with weight loss.  Patient will monitor for excessive nausea and she will also monitor for abdominal pain.  If those occur she will let us know right away.  If the abdominal pain occurs she will discontinue the Ozempic.

## 2023-03-28 NOTE — Assessment & Plan Note (Signed)
Chronic issue.  Adequately controlled.  Patient will continue carvedilol 12.5 mg twice daily, spironolactone 25 mg daily, and telmisartan 80 mg daily.

## 2023-03-29 ENCOUNTER — Ambulatory Visit: Payer: 59 | Admitting: *Deleted

## 2023-03-29 ENCOUNTER — Ambulatory Visit: Payer: 59

## 2023-03-29 VITALS — Ht 61.0 in | Wt 230.2 lb

## 2023-03-29 DIAGNOSIS — M6281 Muscle weakness (generalized): Secondary | ICD-10-CM

## 2023-03-29 DIAGNOSIS — Z0000 Encounter for general adult medical examination without abnormal findings: Secondary | ICD-10-CM

## 2023-03-29 DIAGNOSIS — R262 Difficulty in walking, not elsewhere classified: Secondary | ICD-10-CM

## 2023-03-29 NOTE — Therapy (Signed)
OUTPATIENT PHYSICAL THERAPY NEURO TREATMENT  Patient Name: Susan [name] MRN: [id number] DOB:02-10-1961, 62 y.o., female Today's Date: 03/29/2023   PCP: [name] MD REFERRING PROVIDER: Venetia Night MD  END OF SESSION:  PT End of Session - 03/29/23 1252     Visit Number 21    Number of Visits 25    Date for PT Re-Evaluation 04/18/23    PT Start Time 1255    PT Stop Time 1339    PT Time Calculation (min) 44 min    Equipment Utilized During Treatment Gait belt    Activity Tolerance Patient tolerated treatment well    Behavior During Therapy [facility] for tasks assessed/performed             Past Medical History:  Diagnosis Date   ADD (attention deficit disorder)    Alcohol abuse    Anemia    Anxiety    Aortic atherosclerosis (HCC) 02/20/2018   Chest CT Sept 2019   Arthritis    rheumatoid arthritis   Asthma    Bipolar disorder (HCC)    Centrilobular emphysema (HCC)    Cocaine use disorder, moderate, in sustained remission (HCC)    Constipation    Degenerative disc disease at L5-S1 level 09/28/2016   See ortho note May 2018   Depression    bipolar, hx of suicide attempt   Diabetes mellitus, type 2 (HCC)    Diverticulitis 2013   DOE (dyspnea on exertion)    Drug use    Family history of adverse reaction to anesthesia    mom-delayed emergence   GERD (gastroesophageal reflux disease)    H/O suicide attempt    slit wrists   Hepatitis C 06/26/2014   treated with Harvoni   Hip pain    History of echocardiogram    a. 04/2019 Echo: EF >65%, nl RV fxn.   History of MRSA infection 2013   HLD (hyperlipidemia)    Hypertension    a. 06/2021 Renal Duplex: ? bilat RAS; b. 06/2021 CTA Abd: No signif RAS.   Hyponatremia    Hypothyroidism    Incisional hernia 11/08/2012   Knee pain    Left carotid bruit    Morbid obesity (HCC)    Multinodular thyroid    OSA (obstructive sleep apnea)    OSA on CPAP    Osteoporosis    Palpitations    Post-traumatic  osteoarthritis of right knee 09/24/2015   Recurrent ventral hernia 11/08/2012   Status post total right knee replacement using cement 05/10/2016   Stroke (HCC) 07/05/2022   right arm,torso and right leg numbness   Tobacco use disorder    Vitamin B12 deficiency    Vitamin D deficiency disease    Past Surgical History:  Procedure Laterality Date   BILATERAL SALPINGOOPHORECTOMY     due to abnormal mass   BREAST BIOPSY Left    neg   BREAST SURGERY Left 20 yrs ago   CESAREAN SECTION     COLONOSCOPY     COLONOSCOPY WITH PROPOFOL N/A 10/16/2017   Procedure: COLONOSCOPY WITH PROPOFOL;  Surgeon: Wyline Mood, MD;  Location: [facility] ENDOSCOPY;  Service: Gastroenterology;  Laterality: N/A;   COLONOSCOPY WITH PROPOFOL N/A 02/16/2021   Procedure: COLONOSCOPY WITH PROPOFOL;  Surgeon: Wyline Mood, MD;  Location: [facility] ENDOSCOPY;  Service: Gastroenterology;  Laterality: N/A;   FORAMINOTOMY 1 LEVEL Right 10/10/2022   Procedure: RIGHT L4-5 LAMINOFORAMINOTOMY;  Surgeon: Venetia Night, MD;  Location: [facility] ORS;  Service: Neurosurgery;  Laterality: Right;   HERNIA  REPAIR  06/2011, July 2014   Ventral wall repair with Physiomesh   HERNIA REPAIR     2nd.vental wall repair   JOINT REPLACEMENT Right    knee   LUMBAR LAMINECTOMY/ DECOMPRESSION WITH MET-RX N/A 10/12/2022   Procedure: LUMBAR LAMINECTOMY/ DECOMPRESSION WITH MET-RX;  Surgeon: Venetia Night, MD;  Location: [facility] ORS;  Service: Neurosurgery;  Laterality: N/A;   TONSILLECTOMY     TOTAL HIP ARTHROPLASTY Left 07/23/2019   Procedure: TOTAL HIP ARTHROPLASTY;  Surgeon: Christena Flake, MD;  Location: [facility] ORS;  Service: Orthopedics;  Laterality: Left;   TOTAL KNEE ARTHROPLASTY Right 05/10/2016   Procedure: TOTAL KNEE ARTHROPLASTY;  Surgeon: Christena Flake, MD;  Location: [facility] ORS;  Service: Orthopedics;  Laterality: Right;   TUBAL LIGATION     Patient Active Problem List   Diagnosis Date Noted   Neurogenic bowel 02/13/2023   Neurogenic bladder 02/13/2023    Abnormality of gait and mobility 02/13/2023   Blister of foot without infection, right, initial encounter 01/05/2023   Bipolar 1 disorder, depressed, moderate (HCC) 11/29/2022   Anemia 10/13/2022   Cauda equina syndrome (HCC) 10/12/2022   Epidural hematoma (HCC) 10/12/2022   Mass in epidural space 10/12/2022   Synovial cyst of lumbar facet joint 10/10/2022   Lumbar radiculopathy 10/10/2022   S/P laminectomy 10/10/2022   Epigastric pain 07/22/2022   Enlarged thyroid 07/22/2022   Chronic diarrhea 07/22/2022   CVA (cerebrovascular accident) (HCC) 07/05/2022   Skin lesion 06/28/2022   BMI 38.0-38.9,adult 07/14/2021   Type 2 diabetes mellitus without complication, without long-term current use of insulin (HCC) 06/16/2021   H/O thyroid nodule 05/31/2021   History of hepatitis C- treated 2014- 2015 with [facility] gastroenterology and followed.  05/19/2021   Left carotid bruit 04/28/2021   Anxiety 05/28/2020   Bipolar affective disorder in remission (HCC) 05/28/2020   Bipolar disorder, in full remission, most recent episode mixed (HCC) 03/06/2020   Need for immunization against influenza 01/27/2020   Morbid obesity (HCC) 01/27/2020   Osteoporosis 11/06/2019   Bipolar 1 disorder, mixed, mild (HCC) 10/29/2019   PTSD (post-traumatic stress disorder) 09/27/2019   Bereavement 09/27/2019   Cocaine use disorder, moderate, in sustained remission (HCC) 09/27/2019   Status post total hip replacement, left 07/23/2019   Primary osteoarthritis of left hip 06/14/2019   Depression 10/23/2018   Allergic rhinitis 08/23/2018   Aortic atherosclerosis (HCC) 02/20/2018   Centrilobular emphysema (HCC) 02/20/2018   Hyperlipidemia 03/28/2017   Degenerative disc disease at L5-S1 level 09/28/2016   Tobacco use disorder 04/21/2016   GERD (gastroesophageal reflux disease) 12/17/2015   Chronic back pain 10/09/2015   Post-traumatic osteoarthritis of right knee 09/24/2015   Alcohol use disorder, severe, in sustained  remission (HCC) 04/13/2015   Vitamin D deficiency 04/09/2015   Vitamin B12 deficiency 04/09/2015   OSA on CPAP 04/09/2015   Subclinical hyperthyroidism 06/26/2014   Goiter colloid, toxic, nodular 06/26/2014   Hypertension 06/26/2014   Tobacco use disorder, continuous 06/26/2014   Bipolar I disorder (HCC) 06/26/2014   Lumbar radiculitis 10/03/2013   Diverticulosis 08/24/2013    ONSET DATE: 10/12/22  REFERRING DIAG: Z61.096 (ICD-10-CM) - Status post lumbar spine surgery for decompression of spinal cord G83.4 (ICD-10-CM) - Cauda equina syndrome (HCC)  THERAPY DIAG:  Muscle weakness (generalized)  Difficulty in walking, not elsewhere classified  Rationale for Evaluation and Treatment: Rehabilitation  SUBJECTIVE:  SUBJECTIVE STATEMENT:  Pt reports feeling tired from moving. Moving on December 1st. Next week will be her last appointment. Denies LBP today.  Pt accompanied by: self  PERTINENT HISTORY:  Susan [name] is a 62 y/o F with PMH: HTN, DM2, HLD, ETOH use, THA, PTSD, cocaine, bipolar, and L thalamic infarct (06/2022), s/p lumbar laminoforaminotomy (10/10/22). Presenting post L3-5 decompression with evacuation of epidural hematoma on 10/12/22 for Cauda Equina Syndrome. Pt has residual RLE deficits after her lumbar surgeries leading to ambulating with RW. She was independent prior to her surgeries now reporting balance and gait difficulties. Has had some Worsening RUE N/T since her CVA in February after her lumbar surgeries. Reports N/T 6-7/10 on severity. Mentions some falls when not using her RW. Feels like she always has to have a hand on something to steady herself.  Pt has residual RLE deficits after her lumbar surgeries leading to ambulating with RW. She was independent prior to her surgeries now  reporting balance and gait difficulties. Has had some Worsening RUE N/T since her CVA in February after her lumbar surgeries. Reports N/T 6-7/10 on severity. Mentions some falls when not using her RW. Feels like she always has to have a hand on something to steady herself. Has had some falls anterior standing too close in the RW, a fall walking up the stairs with the RW. She is able to get herself up. Pt describing some unsafe scenarios with poor RW use with transfers and asc/desc curbs.   PAIN:  Are you having pain? No  PRECAUTIONS: Back  RED FLAGS: Bowel or bladder incontinence: Yes: MD aware since surgery.     WEIGHT BEARING RESTRICTIONS: No  FALLS: Has patient fallen in last 6 months? Yes. Number of falls 3  LIVING ENVIRONMENT: Lives with: lives with their son   PLOF: Independent  PATIENT GOALS: Improve her walking and   OBJECTIVE:   DIAGNOSTIC FINDINGS: CLINICAL DATA:  Elective surgery   EXAM: LUMBAR SPINE - 2-3 VIEW   COMPARISON:  10/10/2022   FINDINGS: Intraoperative fluoroscopy is utilized for surgical control purposes. Fluoroscopy time is recorded at 6 seconds. Dose 5.33 mGy. Two spot fluoroscopic images are obtained. Initial image demonstrates a localization marker posterior to the L4 vertebra. Second image demonstrates a localization marker posterior to the L3-4 interspace level.   IMPRESSION: Intraoperative fluoroscopy utilized for surgical control purposes for lumbar spine localization.     Electronically Signed   By: Burman Nieves M.D.   On: 10/12/2022 20:24  COGNITION: Overall cognitive status: Within functional limits for tasks assessed   SENSATION: Light touch: Impaired  RLE: impaired L2, L4-L5  COORDINATION: Heel to shin normal bilaterally   PROPRIOCEPTION: Normal bilat   POSTURE:  Heavy BUE support on RW with standing tasks with bilateral upper trap use   LUMBAR AROM: Flexion: 75% limited  Extension: 50% limited  Rotation R/L:  50% limited  Lateral flexion R/L: 50% limited  LOWER EXTREMITY AROM:  L ankle inversion: 20, eversion: 35  R ankle inversion: 15 , eversion: 25    LOWER EXTREMITY MMT:    MMT Right Eval Left Eval  Hip flexion 4- 4  Hip extension    Hip abduction    Hip adduction    Hip internal rotation    Hip external rotation    Knee flexion 4- 4+  Knee extension 4 4+  Ankle dorsiflexion 4- 4+  Ankle plantarflexion    Ankle inversion 3 4+  Ankle eversion 3 4-  (Blank rows =  not tested)  TRANSFERS: Assistive device utilized: Environmental consultant - 2 wheeled  Sit to stand: Modified independence Stand to sit: Modified independence  *poor hand placement keeping BUE's on RW for STS and stand to sit despite VC's and education for safe hand placement.   GAIT: Gait pattern: step through pattern, decreased stance time- Right, decreased ankle dorsiflexion- Right, and poor foot clearance- Right. Notable R ankle inversion in stance phase due to evertor weakness Distance walked: 10 meters Assistive device utilized: Walker - 2 wheeled Level of assistance: Modified independence Comments: Heavy BUE support on RW. PT adjusting RW height to reduced B shoulder shrug.   FUNCTIONAL TESTS:  5 times sit to stand: 29.37 seconds Timed up and go (TUG): 26.89 seconds 10 meter walk test: 0.48 m/s with RW : 434ft PATIENT SURVEYS:  FOTO 37 with target score of 43  TODAY'S TREATMENT: DATE: 03/29/23  There.ex:  Nustep x5 minutes Lvl 5.0 seat #7 for LE strengthening and lumbar mobility. Not billed.   There- act:  Functional training to support pt's moving tasks lifting 9.5# box with 5# AW from 11.5" step on top of wedge turning 90 deg placing on elevated plinth table 2x5 (VC's for proper bilat LE negotiation, mod carryover).  Reviewed safe lifting mechanics with use of squat for LE use compared to spinal flexion. Educated to utilize scap retraction to prevent lumbar extension compensation.  Neuro  Re-ed: Reviewed and updated HEP. Education on reps/sets/frequency   Mini squats: x8  Standing alternating marches: x12/LE   Standing toe raises: x12/LE Seated R ankle DF with RTB. Regressed due to R ankle DF weakness.   Standing marches: x12/LE  Standing tandem: x30 sec/LE under BOS  Gait no AD, 2x10 meters working on foot clearance, reciprocal arm swing.   PATIENT EDUCATION: Education details: HEP, safe use of AD, POC Person educated: Patient Education method: Explanation, Demonstration, Verbal cues, and Handouts Education comprehension: verbalized understanding and needs further education  HOME EXERCISE PROGRAM: Access Code: [email or web address] URL: [email or web address] Date: 03/29/2023 Prepared by: Ronnie Derby  Exercises - Standing Toe Raises at Chair  - 1 x daily - 3-4 x weekly - 3 sets - 10 reps - Mini Squat with Counter Support  - 1 x daily - 3-4 x weekly - 3 sets - 10 reps - Scapular Retraction with Resistance  - 1 x daily - 4-5 x weekly - 3 sets - 15 reps - Standing March with Counter Support  - 1 x daily - 3-4 x weekly - 3 sets - 12 reps - Standing Tandem Balance with Counter Support  - 1 x daily - 7 x weekly - 1 sets - 3 reps - 30 hold - Seated Heel Toe Raises  - 1 x daily - 3-4 x weekly - 3 sets - 8-10 reps  Access Code: [email or web address] URL: [email or web address] Date: 01/24/2023 Prepared by: Ronnie Derby  Exercises - Seated Scapular Retraction  - 1 x daily - 7 x weekly - 3 sets - 15 reps - Seated Ankle Circles  - 1 x daily - 7 x weekly - 3 sets - 15 reps - Seated Heel Toe Raises  - 1 x daily - 4-5 x weekly - 3 sets - 10 reps - Sit to Stand with Counter Support  - 1 x daily - 4-5 x weekly - 3 sets - 8 reps   Access Code: [email or web address] URL: [email or web address] Date: 12/13/2022 Prepared by: Ronnie Derby  Exercises - Supine Bridge  - 1 x daily - 7 x weekly -  3 sets - 6 reps - Seated Scapular Retraction  - 1 x daily - 7 x weekly - 3  sets - 15 reps - Seated Ankle Circles  - 1 x daily - 7 x weekly - 3 sets - 15 reps  GOALS: Goals reviewed with patient? No  SHORT TERM GOALS: Target date: 12/24/22  Pt will be independent with HEP to improve RLE strength for transfers and gait Baseline: 12/13/22: Provided initial HEP 02/02/23: HEP compliant Goal status: MET  2.  Pt will demonstrate safe use of hand placement with transfers with LRAD to demonstrate reduced falls risk. Baseline: 12/13/22: BUE use on AD with STS and stand to sit transfers 02/02/23: Correct hand placement noted  Goal status: MET  LONG TERM GOALS: Target date: 04/18/23  Pt will improve FOTO to target score to demonstrate clinically significant improvement in functional mobility.  Baseline: 12/13/22: 37/43; 02/02/23: 43/43 03/07/23 36/43 (answered questions based on not using RW) 38/43+ Goal status: MET  2.  Pt will complete TUG in 12 seconds or less with LRAD to demonstrate reduced falls risk with transfers and short distance gait Baseline: 12/13/22: 26.89 seconds w/ RW, 01/24/23: 44.20 seconds w/ 3 pronged hurry cane 02/02/23: 31.05sec with hurry cane 03/07/23: 19.60 with hurrycane Goal status: ONGOING  3.  Pt will improve 5xSTS to 11.4 seconds or less without UE support to demonstrate improved LE strength to match age based norms.  Baseline: 12/13/22: 29.37 seconds 02/02/23: 20.20 seconds 03/07/23: 13.38 seconds  Goal status: ONGOING  4.  Pt will improve FGA by at least 4 points to demonstrate clinically significant improvement in balance with community activities.  Baseline: 12/13/22: deferred to next session; 12/29/22: 13/30; 02/02/23:14/30; 03/07/23: 9/30 w/o AD Goal status: ONGOING  5.  Pt will improve gait speed to at least 1.0 m/s with LRAD to demonstrate safe ability to complete short community distances. Baseline: 12/13/22: 0.48 m/s with RW;  02/02/23: 0.32 m/s w/ hurry cane; 03/07/23: 0.43m/s w/ hurry cane Goal status: ONGOING  6. Pt will increase 6 MWT by > 165'  to display improvements in functional endurance with community ambulation.   Baseline: 01/12/23: 429 ft w/ 7/10 NPS 02/02/23: Defer to next session 03/07/23: Deferred to next session; 03/24/23: 247ft w/ HURRYCANE   Goal status: ONGOING   ASSESSMENT:  CLINICAL IMPRESSION: Continuing PT POC with focus on return to functional moving tasks and updating HEP. Pt remains reliant on AD for safe ambulation outside of household distances. Generally having difficulty with lifting tasks with BUE's holding onto weighted box due to difficulty with foot clearance due to inability to have UE support on an AD. Reviewed safe lifting mechanics and how to perform balance exercises at home to reduce falls risk as pt is moving in the next 2 weeks. Pt understanding of all education and updated HEP with good performance of form/technique.  Pt will continue to benefit from skilled PT interventions to address remaining LE strength, balance and endurance deficits to improve pt's functional mobility w/ LRAD, and return to PLOF.  OBJECTIVE IMPAIRMENTS: Abnormal gait, decreased balance, decreased mobility, difficulty walking, decreased ROM, decreased strength, impaired sensation, and postural dysfunction.   ACTIVITY LIMITATIONS: carrying, lifting, bending, standing, stairs, transfers, reach over head, and locomotion level  PARTICIPATION LIMITATIONS: cleaning, laundry, driving, shopping, and community activity  PERSONAL FACTORS: Age, Past/current experiences, and Time since onset of injury/illness/exacerbation are also affecting patient's functional outcome.   REHAB POTENTIAL: Good  CLINICAL DECISION MAKING: Evolving/moderate complexity  EVALUATION COMPLEXITY: Moderate  PLAN:  PT FREQUENCY: 2x/week  PT DURATION: 12 weeks  PLANNED INTERVENTIONS: Therapeutic exercises, Therapeutic activity, Neuromuscular re-education, Balance training, Gait training, Patient/Family education, Self Care, Stair training, DME instructions,  Manual therapy, and Re-evaluation  PLAN FOR NEXT SESSION: Continue gait training w/ LRAD/ no AD and bilat LE strengthening exercises.      Delphia Grates. Fairly IV, PT, DPT Physical Therapist- [place]  [facility] 03/29/23, 1:51 PM

## 2023-03-29 NOTE — Progress Notes (Signed)
Subjective:   Susan [name] is a 62 y.o. female who presents for Medicare Annual (Subsequent) preventive examination.  Visit Complete: Virtual I connected with  Susan [name] on 03/29/23 by a audio enabled telemedicine application and verified that I am speaking with the correct person using two identifiers.  Patient Location: Home  Provider Location: Home Office  I discussed the limitations of evaluation and management by telemedicine. The patient expressed understanding and agreed to proceed.  Vital Signs: Because this visit was a virtual/telehealth visit, some criteria may be missing or patient reported. Any vitals not documented were not able to be obtained and vitals that have been documented are patient reported.  Cardiac Risk Factors include: advanced age (>90men, >27 women);diabetes mellitus;obesity (BMI >30kg/m2);smoking/ tobacco exposure;dyslipidemia;hypertension     Objective:    Today's Vitals   03/29/23 0903  Weight: 230 lb 4 oz (104.4 kg)  Height: 5\' 1"  (1.549 m)   Body mass index is 43.51 kg/m.     03/29/2023    9:25 AM 12/13/2022    9:49 AM 11/05/2022    8:12 AM 10/12/2022   10:31 PM 10/12/2022   12:12 PM 10/05/2022   12:04 PM 08/02/2022   12:07 PM  Advanced Directives  Does Patient Have a Medical Advance Directive? No No No  No No No  Would patient like information on creating a medical advance directive? No - Patient declined No - Patient declined No - Patient declined No - Patient declined   Yes (ED - Information included in AVS)    Current Medications (verified) Outpatient Encounter Medications as of 03/29/2023  Medication Sig   acetaminophen (TYLENOL) 500 MG tablet Take 1,000 mg by mouth 2 (two) times daily.   albuterol (VENTOLIN HFA) 108 (90 Base) MCG/ACT inhaler Inhale 2 puffs into the lungs every 6 (six) hours as needed for wheezing or shortness of breath.   aspirin EC 81 MG tablet Take by mouth.   celecoxib (CELEBREX) 100 MG capsule TAKE 1  CAPSULE(100 MG) BY MOUTH TWICE DAILY   clotrimazole (LOTRIMIN) 1 % cream Apply 1 application  topically 2 (two) times daily as needed.   cyclobenzaprine (FLEXERIL) 5 MG tablet Take 5 mg by mouth 2 (two) times daily.   docusate sodium (COLACE) 100 MG capsule Take 1 capsule (100 mg total) by mouth daily. Take with breakfast   DULoxetine (CYMBALTA) 20 MG capsule TAKE ONE CAPSULE BY MOUTH DAILY WITH 30 MG   DULoxetine (CYMBALTA) 30 MG capsule TAKE ONE CAPSULE BY MOUTH DAILY WITH 20 MG CAPSULE   HYDROcodone-acetaminophen (NORCO/VICODIN) 5-325 MG tablet Take 1 tablet by mouth daily as needed for moderate pain.   lurasidone (LATUDA) 20 MG TABS tablet Take 1 tablet (20 mg total) by mouth daily with supper.   naloxone (NARCAN) nasal spray 4 mg/0.1 mL Place into the nose.   pregabalin (LYRICA) 75 MG capsule Take 2 capsules (150 mg total) by mouth 2 (two) times daily.   rosuvastatin (CRESTOR) 20 MG tablet Take 1 tablet (20 mg total) by mouth daily.   Semaglutide, 2 MG/DOSE, 8 MG/3ML SOPN Inject 2 mg as directed once a week.   spironolactone (ALDACTONE) 25 MG tablet TAKE ONE TABLET BY MOUTH DAILY   telmisartan (MICARDIS) 80 MG tablet TAKE ONE TABLET BY MOUTH DAILY   traZODone (DESYREL) 50 MG tablet TAKE 1 TABLET(50 MG) BY MOUTH AT BEDTIME   Vibegron (GEMTESA) 75 MG TABS Take 1 tablet (75 mg total) by mouth daily.   Vitamin D, Ergocalciferol, (DRISDOL)  1.25 MG (50000 UNIT) CAPS capsule TAKE ONE CAPSULE BY MOUTH ONCE WEEKLY ON SUNDAY   bisacodyl (MAGIC BULLETS) 10 MG suppository Place 1 suppository (10 mg total) rectally as needed for moderate constipation (if no bowel movement in the morning with colace). (Patient not taking: Reported on 03/29/2023)   carvedilol (COREG) 12.5 MG tablet Take 12.5 mg by mouth 2 (two) times daily. (Patient not taking: Reported on 03/29/2023)   gabapentin (NEURONTIN) 300 MG capsule Take 600 mg by mouth. (Patient not taking: Reported on 03/29/2023)   [DISCONTINUED]  sulfamethoxazole-trimethoprim (BACTRIM DS) 800-160 MG tablet Take 1 tablet by mouth every 12 (twelve) hours. (Patient not taking: Reported on 03/29/2023)   No facility-administered encounter medications on file as of 03/29/2023.    Allergies (verified) Wellbutrin [bupropion], Furosemide, and Hydrochlorothiazide   History: Past Medical History:  Diagnosis Date   ADD (attention deficit disorder)    Alcohol abuse    Anemia    Anxiety    Aortic atherosclerosis (HCC) 02/20/2018   Chest CT Sept 2019   Arthritis    rheumatoid arthritis   Asthma    Bipolar disorder (HCC)    Centrilobular emphysema (HCC)    Cocaine use disorder, moderate, in sustained remission (HCC)    Constipation    Degenerative disc disease at L5-S1 level 09/28/2016   See ortho note May 2018   Depression    bipolar, hx of suicide attempt   Diabetes mellitus, type 2 (HCC)    Diverticulitis 2013   DOE (dyspnea on exertion)    Drug use    Family history of adverse reaction to anesthesia    mom-delayed emergence   GERD (gastroesophageal reflux disease)    H/O suicide attempt    slit wrists   Hepatitis C 06/26/2014   treated with Harvoni   Hip pain    History of echocardiogram    a. 04/2019 Echo: EF >65%, nl RV fxn.   History of MRSA infection 2013   HLD (hyperlipidemia)    Hypertension    a. 06/2021 Renal Duplex: ? bilat RAS; b. 06/2021 CTA Abd: No signif RAS.   Hyponatremia    Hypothyroidism    Incisional hernia 11/08/2012   Knee pain    Left carotid bruit    Morbid obesity (HCC)    Multinodular thyroid    OSA (obstructive sleep apnea)    OSA on CPAP    Osteoporosis    Palpitations    Post-traumatic osteoarthritis of right knee 09/24/2015   Recurrent ventral hernia 11/08/2012   Status post total right knee replacement using cement 05/10/2016   Stroke (HCC) 07/05/2022   right arm,torso and right leg numbness   Tobacco use disorder    Vitamin B12 deficiency    Vitamin D deficiency disease    Past  Surgical History:  Procedure Laterality Date   BILATERAL SALPINGOOPHORECTOMY     due to abnormal mass   BREAST BIOPSY Left    neg   BREAST SURGERY Left 20 yrs ago   CESAREAN SECTION     COLONOSCOPY     COLONOSCOPY WITH PROPOFOL N/A 10/16/2017   Procedure: COLONOSCOPY WITH PROPOFOL;  Surgeon: Wyline Mood, MD;  Location: [facility] ENDOSCOPY;  Service: Gastroenterology;  Laterality: N/A;   COLONOSCOPY WITH PROPOFOL N/A 02/16/2021   Procedure: COLONOSCOPY WITH PROPOFOL;  Surgeon: Wyline Mood, MD;  Location: [facility] ENDOSCOPY;  Service: Gastroenterology;  Laterality: N/A;   FORAMINOTOMY 1 LEVEL Right 10/10/2022   Procedure: RIGHT L4-5 LAMINOFORAMINOTOMY;  Surgeon: Venetia Night, MD;  Location:  [facility] ORS;  Service: Neurosurgery;  Laterality: Right;   HERNIA REPAIR  06/2011, July 2014   Ventral wall repair with Physiomesh   HERNIA REPAIR     2nd.vental wall repair   JOINT REPLACEMENT Right    knee   LUMBAR LAMINECTOMY/ DECOMPRESSION WITH MET-RX N/A 10/12/2022   Procedure: LUMBAR LAMINECTOMY/ DECOMPRESSION WITH MET-RX;  Surgeon: Venetia Night, MD;  Location: [facility] ORS;  Service: Neurosurgery;  Laterality: N/A;   TONSILLECTOMY     TOTAL HIP ARTHROPLASTY Left 07/23/2019   Procedure: TOTAL HIP ARTHROPLASTY;  Surgeon: Christena Flake, MD;  Location: [facility] ORS;  Service: Orthopedics;  Laterality: Left;   TOTAL KNEE ARTHROPLASTY Right 05/10/2016   Procedure: TOTAL KNEE ARTHROPLASTY;  Surgeon: Christena Flake, MD;  Location: [facility] ORS;  Service: Orthopedics;  Laterality: Right;   TUBAL LIGATION     Family History  Problem Relation Age of Onset   Arthritis Mother    Asthma Mother    Mental illness Mother    Thyroid disease Mother    COPD Mother    Heart disease Mother    Congestive Heart Failure Mother    Alcohol abuse Mother    Eating disorder Mother    Bipolar disorder Mother    Alcohol abuse Father    Heart attack Father    Alcohol abuse Sister    Drug abuse Sister    Mental illness Sister     Mental illness Sister    Fibromyalgia Sister    Obesity Sister    Pneumonia Sister    Depression Sister    Mental illness Sister    Alcohol abuse Sister    Drug abuse Sister    Arthritis Brother    Mental illness Brother    Cancer Brother        non-hodkins lymphoma   Drug abuse Daughter    Drug abuse Son    Alcohol abuse Son    Drug abuse Son    Alcohol abuse Son    Diabetes Neg Hx    Stroke Neg Hx    Breast cancer Neg Hx    Thyroid cancer Neg Hx    Social History   Socioeconomic History   Marital status: Widowed    Spouse name: Not on file   Number of children: 3   Years of education: Not on file   Highest education level: Some college, no degree  Occupational History   Occupation: disabled  Tobacco Use   Smoking status: Every Day    Current packs/day: 0.50    Average packs/day: 0.5 packs/day for 41.0 years (20.5 ttl pk-yrs)    Types: Cigarettes   Smokeless tobacco: Never  Vaping Use   Vaping status: Every Day   Substances: Nicotine  Substance and Sexual Activity   Alcohol use: No    Alcohol/week: 0.0 standard drinks of 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   Drug use: No    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   Sexual activity: Not Currently  Other Topics Concern   Not on file  Social History Narrative   Not on file   Social Determinants of Health   Financial Resource Strain: Low Risk  (03/29/2023)   Overall Financial Resource Strain (CARDIA)    Difficulty of Paying Living Expenses: Not hard at all  Food Insecurity: No Food Insecurity (03/29/2023)   Hunger Vital Sign    Worried About Running Out of Food in the Last Year: Never true  Ran Out of Food in the Last Year: Never true  Transportation Needs: No Transportation Needs (03/29/2023)   PRAPARE - Administrator, Civil Service (Medical): No    Lack of Transportation (Non-Medical): No  Physical Activity: Inactive (03/29/2023)   Exercise Vital Sign    Days of  Exercise per Week: 0 days    Minutes of Exercise per Session: 0 min  Stress: No Stress Concern Present (03/29/2023)   [employer] of Occupational Health - Occupational Stress Questionnaire    Feeling of Stress : Only a little  Social Connections: Moderately Isolated (03/29/2023)   Social Connection and Isolation Panel [NHANES]    Frequency of Communication with Friends and Family: Twice a week    Frequency of Social Gatherings with Friends and Family: More than three times a week    Attends Religious Services: Never    Database administrator or Organizations: Yes    Attends Engineer, structural: More than 4 times per year    Marital Status: Widowed    Tobacco Counseling Ready to quit: Yes Counseling given: Yes   Clinical Intake:  Pre-visit preparation completed: Yes  Pain : No/denies pain     BMI - recorded: 43.51 Nutritional Status: BMI > 30  Obese Nutritional Risks: Other (Comment) (has had loose stools for a while seeing doctor for that) Diabetes: Yes CBG done?: No Did pt. bring in CBG monitor from home?: No  How often do you need to have someone help you when you read instructions, pamphlets, or other written materials from your doctor or pharmacy?: 1 - Never  Interpreter Needed?: No  Information entered by :: R. Ajahni Nay LPN   Activities of Daily Living    03/29/2023    9:08 AM 10/12/2022   10:00 PM  In your present state of health, do you have any difficulty performing the following activities:  Hearing? 0 0  Vision? 0 0  Comment glasses   Difficulty concentrating or making decisions? 1 0  Walking or climbing stairs? 1 1  Dressing or bathing? 0 0  Doing errands, shopping? 0 0  Preparing Food and eating ? N   Using the Toilet? N   In the past six months, have you accidently leaked urine? Y   Do you have problems with loss of bowel control? Y   Managing your Medications? N   Managing your Finances? N   Housekeeping or managing your  Housekeeping? N     Patient Care Team: Glori Luis, MD as PCP - General (Family Medicine) Debbe Odea, MD as PCP - Cardiology (Cardiology) Merri Ray, MD as Referring Physician (Physical Medicine and Rehabilitation) Lonell Face, MD (Neurology) Tedd Sias Marlana Salvage, MD as Consulting Physician (Endocrinology) Galen Manila, MD as Referring Physician (Ophthalmology) Marney Doctor Kizzie Ide, PA-C as Physician Assistant (Physician Assistant) Poggi, Excell Seltzer, MD as Consulting Physician (Orthopedic Surgery) Linus Galas, DPM (Podiatry) Jomarie Longs, MD as Consulting Physician (Psychiatry) Delight Ovens, LCSW as Social Worker (Psychology) Deirdre Evener, MD (Dermatology)  Indicate any recent Medical Services you may have received from other than [facility] providers in the past year (date may be approximate).     Assessment:   This is a routine wellness examination for Susan [name].  Hearing/Vision screen Hearing Screening - Comments:: No issues Vision Screening - Comments:: glasses   Goals Addressed             This Visit's Progress    Patient Stated  Try to quit smoking and try to switch from a walker to a cane       Depression Screen    03/29/2023    9:19 AM 03/28/2023    9:22 AM 02/13/2023    9:12 AM 12/28/2022   10:39 AM 12/02/2022   11:00 AM 11/29/2022    9:15 AM 11/21/2022    2:33 PM  PHQ 2/9 Scores  PHQ - 2 Score 1 1 2 2 2  2   PHQ- 9 Score 5 6 4 4 8  6      Information is confidential and restricted. Go to Review Flowsheets to unlock data.    Fall Risk    03/29/2023    9:11 AM 03/28/2023    9:22 AM 02/13/2023    9:12 AM 11/21/2022    2:32 PM 09/28/2022    9:21 AM  Fall Risk   Falls in the past year? 1 0 1 1 0  Comment   Last fall on 01/11/2023 at home. No injury.    Number falls in past yr: 1 0 1 0 0  Injury with Fall? 0 0 0 0 0  Risk for fall due to : History of fall(s);Impaired balance/gait No Fall Risks  History of fall(s) No Fall Risks   Follow up Falls evaluation completed;Falls prevention discussed Falls evaluation completed  Falls evaluation completed Falls evaluation completed    MEDICARE RISK AT HOME: Medicare Risk at Home Any stairs in or around the home?: Yes If so, are there any without handrails?: Yes Home free of loose throw rugs in walkways, pet beds, electrical cords, etc?: Yes Adequate lighting in your home to reduce risk of falls?: Yes Life alert?: No Use of a cane, walker or w/c?: Yes Grab bars in the bathroom?: Yes Shower chair or bench in shower?: Yes Elevated toilet seat or a handicapped toilet?: Yes      Cognitive Function:        03/29/2023    9:25 AM 01/04/2019    2:55 PM 07/19/2018    1:13 PM  6CIT Screen  What Year? 0 points 0 points 0 points  What month? 0 points 0 points 0 points  What time? 0 points 0 points 0 points  Count back from 20 0 points 0 points 0 points  Months in reverse 0 points 0 points 0 points  Repeat phrase 0 points 0 points 0 points  Total Score 0 points 0 points 0 points    Immunizations Immunization History  Administered Date(s) Administered   Influenza Inj Mdck Quad Pf 02/13/2019   Influenza, Seasonal, Injecte, Preservative Fre 03/28/2023   Influenza,inj,Quad PF,6+ Mos 04/09/2015, 01/10/2018, 04/08/2019, 01/27/2020, 02/03/2021, 03/03/2022   Influenza-Unspecified 05/26/2014, 03/30/2016   PFIZER(Purple Top)SARS-COV-2 Vaccination 08/31/2019, 09/24/2019   PNEUMOCOCCAL CONJUGATE-20 03/24/2021   Pfizer Covid-19 Vaccine Bivalent Booster 17yrs & up 07/01/2020   Pneumococcal Polysaccharide-23 04/09/2015   Tdap 03/09/2012    TDAP status: Due, Education has been provided regarding the importance of this vaccine. Advised may receive this vaccine at local pharmacy or Health Dept. Aware to provide a copy of the vaccination record if obtained from local pharmacy or Health Dept. Verbalized acceptance and understanding.  Flu Vaccine status: Up to date  Pneumococcal  vaccine status: Up to date  Covid-19 vaccine status: Information provided on how to obtain vaccines.   Qualifies for Shingles Vaccine? Yes   Zostavax completed No   Shingrix Completed?: No.    Education has been provided regarding the importance of this vaccine. Patient has  been advised to call insurance company to determine out of pocket expense if they have not yet received this vaccine. Advised may also receive vaccine at local pharmacy or Health Dept. Verbalized acceptance and understanding.  Screening Tests Health Maintenance  Topic Date Due   OPHTHALMOLOGY EXAM  Never done   Zoster Vaccines- Shingrix (1 of 2) Never done   Colonoscopy  02/16/2022   DTaP/Tdap/Td (2 - Td or Tdap) 03/09/2022   Cervical Cancer Screening (Pap smear)  03/24/2022   Medicare Annual Wellness (AWV)  06/02/2022   MAMMOGRAM  06/09/2022   COVID-19 Vaccine (4 - 2023-24 season) 04/12/2023 (Originally 01/08/2023)   Diabetic kidney evaluation - Urine ACR  06/29/2023   FOOT EXAM  06/29/2023   HEMOGLOBIN A1C  09/25/2023   Lung Cancer Screening  10/17/2023   Diabetic kidney evaluation - eGFR measurement  03/27/2024   INFLUENZA VACCINE  Completed   Hepatitis C Screening  Completed   HIV Screening  Completed   HPV VACCINES  Aged Out    Health Maintenance  Health Maintenance Due  Topic Date Due   OPHTHALMOLOGY EXAM  Never done   Zoster Vaccines- Shingrix (1 of 2) Never done   Colonoscopy  02/16/2022   DTaP/Tdap/Td (2 - Td or Tdap) 03/09/2022   Cervical Cancer Screening (Pap smear)  03/24/2022   Medicare Annual Wellness (AWV)  06/02/2022   MAMMOGRAM  06/09/2022    Colorectal cancer screening: Type of screening: Colonoscopy. Completed 03/2021. Repeat every 1 years Patient is aware and unable to do it now. Patient is moving in with her brother out of town for 6 months.  Mammogram status: Completed 07/2022. Repeat every year  Bone Density status: Completed 06/09/2021. Results reflect: Bone density results:  OSTEOPOROSIS. Repeat every 2 years.  Lung Cancer Screening: (Low Dose CT Chest recommended if Age 38-80 years, 20 pack-year currently smoking OR have quit w/in 15years.) does qualify.     Additional Screening:  Hepatitis C Screening: does qualify; Completed 04/2015  Vision Screening: Recommended annual ophthalmology exams for early detection of glaucoma and other disorders of the eye. Is the patient up to date with their annual eye exam?  No  Who is the provider or what is the name of the office in which the patient attends annual eye exams? Patient will schedule an eye appointment If pt is not established with a provider, would they like to be referred to a provider to establish care? No .   Dental Screening: Recommended annual dental exams for proper oral hygiene  Diabetic Foot Exam: Diabetic Foot Exam: Completed 06/2022  Community Resource Referral / Chronic Care Management: CRR required this visit?  No   CCM required this visit?  No     Plan:     I have personally reviewed and noted the following in the patient's chart:   Medical and social history Use of alcohol, tobacco or illicit drugs  Current medications and supplements including opioid prescriptions. Patient is currently taking opioid prescriptions. Information provided to patient regarding non-opioid alternatives. Patient advised to discuss non-opioid treatment plan with their provider. Functional ability and status Nutritional status Physical activity Advanced directives List of other physicians Hospitalizations, surgeries, and ER visits in previous 12 months Vitals Screenings to include cognitive, depression, and falls Referrals and appointments  In addition, I have reviewed and discussed with patient certain preventive protocols, quality metrics, and best practice recommendations. A written personalized care plan for preventive services as well as general preventive health recommendations were provided to  patient.     Sydell Axon, LPN   [id number]   After Visit Summary: (MyChart) Due to this being a telephonic visit, the after visit summary with patients personalized plan was offered to patient via MyChart   Nurse Notes: None

## 2023-03-29 NOTE — Patient Instructions (Addendum)
Susan [name] , Thank you for taking time to come for your Medicare Wellness Visit. I appreciate your ongoing commitment to your health goals. Please review the following plan we discussed and let me know if I can assist you in the future.   Referrals/Orders/Follow-Ups/Clinician Recommendations: Remember to reach out to your GI doctor regarding your colonoscopy and update your vaccines  Managing Pain Without Opioids Opioids are strong medicines used to treat moderate to severe pain. For some people, especially those who have long-term (chronic) pain, opioids may not be the best choice for pain management due to: Side effects like nausea, constipation, and sleepiness. The risk of addiction (opioid use disorder). The longer you take opioids, the greater your risk of addiction. Pain that lasts for more than 3 months is called chronic pain. Managing chronic pain usually requires more than one approach and is often provided by a team of health care providers working together (multidisciplinary approach). Pain management may be done at a pain management center or pain clinic. How to manage pain without the use of opioids Use non-opioid medicines Non-opioid medicines for pain may include: Over-the-counter or prescription non-steroidal anti-inflammatory drugs (NSAIDs). These may be the first medicines used for pain. They work well for muscle and bone pain, and they reduce swelling. Acetaminophen. This over-the-counter medicine may work well for milder pain but not swelling. Antidepressants. These may be used to treat chronic pain. A certain type of antidepressant (tricyclics) is often used. These medicines are given in lower doses for pain than when used for depression. Anticonvulsants. These are usually used to treat seizures but may also reduce nerve (neuropathic) pain. Muscle relaxants. These relieve pain caused by sudden muscle tightening (spasms). You may also use a pain medicine that is applied to the  skin as a patch, cream, or gel (topical analgesic), such as a numbing medicine. These may cause fewer side effects than medicines taken by mouth. Do certain therapies as directed Some therapies can help with pain management. They include: Physical therapy. You will do exercises to gain strength and flexibility. A physical therapist may teach you exercises to move and stretch parts of your body that are weak, stiff, or painful. You can learn these exercises at physical therapy visits and practice them at home. Physical therapy may also involve: Massage. Heat wraps or applying heat or cold to affected areas. Electrical signals that interrupt pain signals (transcutaneous electrical nerve stimulation, TENS). Weak lasers that reduce pain and swelling (low-level laser therapy). Signals from your body that help you learn to regulate pain (biofeedback). Occupational therapy. This helps you to learn ways to function at home and work with less pain. Recreational therapy. This involves trying new activities or hobbies, such as a physical activity or drawing. Mental health therapy, including: Cognitive behavioral therapy (CBT). This helps you learn coping skills for dealing with pain. Acceptance and commitment therapy (ACT) to change the way you think and react to pain. Relaxation therapies, including muscle relaxation exercises and mindfulness-based stress reduction. Pain management counseling. This may be individual, family, or group counseling.  Receive medical treatments Medical treatments for pain management include: Nerve block injections. These may include a pain blocker and anti-inflammatory medicines. You may have injections: Near the spine to relieve chronic back or neck pain. Into joints to relieve back or joint pain. Into nerve areas that supply a painful area to relieve body pain. Into muscles (trigger point injections) to relieve some painful muscle conditions. A medical device placed near  your spine  to help block pain signals and relieve nerve pain or chronic back pain (spinal cord stimulation device). Acupuncture. Follow these instructions at home Medicines Take over-the-counter and prescription medicines only as told by your health care provider. If you are taking pain medicine, ask your health care providers about possible side effects to watch out for. Do not drive or use heavy machinery while taking prescription opioid pain medicine. Lifestyle  Do not use drugs or alcohol to reduce pain. If you drink alcohol, limit how much you have to: 0-1 drink a day for women who are not pregnant. 0-2 drinks a day for men. Know how much alcohol is in a drink. In the [place] one drink equals one 12 oz bottle of beer (355 mL), one 5 oz glass of wine (148 mL), or one 1 oz glass of hard liquor (44 mL). Do not use any products that contain nicotine or tobacco. These products include cigarettes, chewing tobacco, and vaping devices, such as e-cigarettes. If you need help quitting, ask your health care provider. Eat a healthy diet and maintain a healthy weight. Poor diet and excess weight may make pain worse. Eat foods that are high in fiber. These include fresh fruits and vegetables, whole grains, and beans. Limit foods that are high in fat and processed sugars, such as fried and sweet foods. Exercise regularly. Exercise lowers stress and may help relieve pain. Ask your health care provider what activities and exercises are safe for you. If your health care provider approves, join an exercise class that combines movement and stress reduction. Examples include yoga and tai chi. Get enough sleep. Lack of sleep may make pain worse. Lower stress as much as possible. Practice stress reduction techniques as told by your therapist. General instructions Work with all your pain management providers to find the treatments that work best for you. You are an important member of your pain management team.  There are many things you can do to reduce pain on your own. Consider joining an online or in-person support group for people who have chronic pain. Keep all follow-up visits. This is important. Where to find more information You can find more information about managing pain without opioids from: American Academy of Pain Medicine: painmed.org Institute for Chronic Pain: instituteforchronicpain.org American Chronic Pain Association: theacpa.org Contact a health care provider if: You have side effects from pain medicine. Your pain gets worse or does not get better with treatments or home therapy. You are struggling with anxiety or depression. Summary Many types of pain can be managed without opioids. Chronic pain may respond better to pain management without opioids. Pain is best managed when you and a team of health care providers work together. Pain management without opioids may include non-opioid medicines, medical treatments, physical therapy, mental health therapy, and lifestyle changes. Tell your health care providers if your pain gets worse or is not being managed well enough. This information is not intended to replace advice given to you by your health care provider. Make sure you discuss any questions you have with your health care provider. Document Revised: 08/05/2020 Document Reviewed: 08/05/2020 Elsevier Patient Education  2024 [employer].  and update your vaccines.  This is a list of the screening recommended for you and due dates:  Health Maintenance  Topic Date Due   Eye exam for diabetics  Never done   Zoster (Shingles) Vaccine (1 of 2) Never done   Colon Cancer Screening  02/16/2022   DTaP/Tdap/Td vaccine (2 - Td or  Tdap) 03/09/2022   Pap Smear  03/24/2022   Mammogram  06/09/2022   COVID-19 Vaccine (4 - 2023-24 season) 04/12/2023*   Yearly kidney health urinalysis for diabetes  06/29/2023   Complete foot exam   06/29/2023   Hemoglobin A1C  09/25/2023    Screening for Lung Cancer  10/17/2023   Yearly kidney function blood test for diabetes  03/27/2024   Medicare Annual Wellness Visit  03/28/2024   Flu Shot  Completed   Hepatitis C Screening  Completed   HIV Screening  Completed   HPV Vaccine  Aged Out  *Topic was postponed. The date shown is not the original due date.    Advanced directives: (Declined) Advance directive discussed with you today. Even though you declined this today, please call our office should you change your mind, and we can give you the proper paperwork for you to fill out.  Next Medicare Annual Wellness Visit scheduled for next year: Yes 03/29/24 @ 9:40

## 2023-03-30 ENCOUNTER — Encounter: Payer: Self-pay | Admitting: Neurosurgery

## 2023-03-30 ENCOUNTER — Ambulatory Visit: Payer: 59 | Admitting: Neurosurgery

## 2023-03-30 VITALS — BP 124/70 | Ht 61.0 in | Wt 230.0 lb

## 2023-03-30 DIAGNOSIS — G834 Cauda equina syndrome: Secondary | ICD-10-CM

## 2023-03-30 DIAGNOSIS — R29898 Other symptoms and signs involving the musculoskeletal system: Secondary | ICD-10-CM

## 2023-03-30 NOTE — Progress Notes (Signed)
   REFERRING PHYSICIAN:  Nicholaus Corolla [address] [place],  [place] [address]   DOS: 10/12/22 L3-5 lumbar decompression with evacuation of epidural hematoma  DOS:   10/10/22 L3-5 lumbar decompression with evacuation of epidural hematoma   HISTORY OF PRESENT ILLNESS: Susan [name] is status post L3-5 lumbar decompression with evacuation of epidural hematoma.   She has had some improvements, but still has some difficulty with urine and bowel function.  She is walking with a walker most of the time, but was able to walk without a walker at physical therapy yesterday.  She continues to work on her balance with therapy.  She is moving to [place] to be closer to her son.     PHYSICAL EXAMINATION:  General: Patient is well developed, well nourished, calm, collected, and in no apparent distress.   NEUROLOGICAL:  General: In no acute distress.   Awake, alert, oriented to person, place, and time.  Pupils equal round and reactive to light.  Facial tone is symmetric.     Strength:            Side Iliopsoas Quads Hamstring PF DF EHL  R 5 5 5  4+ 4 4  L 5 5 5 5 5 5    Incision shows evidence of superficial dehiscence.  There is some scabbing.  It does not appear infected.   ROS (Neurologic):  Negative except as noted above  IMAGING: Nothing new to review.   ASSESSMENT/PLAN:  Susan [name] is improving after lumbar decompression.  Unfortunately, she suffered an epidural hematoma requiring return to the operating room for evacuation.  She has been left with cauda equina syndrome which is slowly improving, but does result in some issues with bowel bladder control.  I will update her physical therapy.  I think she will continue to improve over the next year or so.  I will see her back in July when she moves back to the area.   Venetia Night MD Department of neurosurgery

## 2023-03-31 ENCOUNTER — Ambulatory Visit: Payer: 59

## 2023-03-31 ENCOUNTER — Telehealth: Payer: Self-pay

## 2023-03-31 NOTE — Telephone Encounter (Signed)
I was looking over the paperwork that was dropped off on this patient and it is asking about accommodations but it doesn't say what kind of accommodation is needed and each page is different, one is asking about accommodations to her house, a live in maid and about service animals.   Can you clarify exactly what accommodations she is looking for? Thank you!

## 2023-04-03 ENCOUNTER — Ambulatory Visit: Payer: 59

## 2023-04-03 NOTE — Telephone Encounter (Signed)
We have received paperwork from [facility] and the patient is requesting for you to fill this out. She is wanting handicap accessibility accommodations (like elevated toliets, hand bars/ grab bars), ok to fill this out?

## 2023-04-04 NOTE — Telephone Encounter (Signed)
I placed this paperwork on your desk, can you please fill this out and notify the patient when it is completed?

## 2023-04-05 ENCOUNTER — Ambulatory Visit: Payer: 59 | Admitting: Physical Therapy

## 2023-04-13 ENCOUNTER — Ambulatory Visit: Payer: 59 | Admitting: Urology

## 2023-04-14 ENCOUNTER — Telehealth: Payer: Self-pay | Admitting: Orthopedic Surgery

## 2023-04-14 DIAGNOSIS — Z9889 Other specified postprocedural states: Secondary | ICD-10-CM

## 2023-04-14 DIAGNOSIS — M5416 Radiculopathy, lumbar region: Secondary | ICD-10-CM

## 2023-04-14 NOTE — Telephone Encounter (Signed)
Patient notified and expressed understanding.

## 2023-04-14 NOTE — Telephone Encounter (Signed)
Please let her know I refilled her celebrex this time, but she should get further refills from her PCP.   Thanks.

## 2023-04-17 DIAGNOSIS — R29898 Other symptoms and signs involving the musculoskeletal system: Secondary | ICD-10-CM | POA: Insufficient documentation

## 2023-04-17 DIAGNOSIS — G834 Cauda equina syndrome: Secondary | ICD-10-CM | POA: Diagnosis not present

## 2023-04-17 DIAGNOSIS — R2681 Unsteadiness on feet: Secondary | ICD-10-CM | POA: Insufficient documentation

## 2023-04-17 DIAGNOSIS — Z8673 Personal history of transient ischemic attack (TIA), and cerebral infarction without residual deficits: Secondary | ICD-10-CM | POA: Insufficient documentation

## 2023-04-17 DIAGNOSIS — S343XXA Injury of cauda equina, initial encounter: Secondary | ICD-10-CM | POA: Insufficient documentation

## 2023-04-19 ENCOUNTER — Encounter: Payer: 59 | Admitting: Physical Medicine and Rehabilitation

## 2023-04-25 DIAGNOSIS — R29898 Other symptoms and signs involving the musculoskeletal system: Secondary | ICD-10-CM | POA: Diagnosis not present

## 2023-04-25 DIAGNOSIS — G834 Cauda equina syndrome: Secondary | ICD-10-CM | POA: Diagnosis not present

## 2023-04-27 ENCOUNTER — Telehealth: Payer: Self-pay | Admitting: Physical Medicine and Rehabilitation

## 2023-04-27 DIAGNOSIS — M5416 Radiculopathy, lumbar region: Secondary | ICD-10-CM

## 2023-04-27 DIAGNOSIS — Z9889 Other specified postprocedural states: Secondary | ICD-10-CM

## 2023-04-27 NOTE — Telephone Encounter (Signed)
Patient called in requesting to ask for medication refill on Lyrica & Celebrex patient is currently out of town for a couple of months ,patient would like it sent to [facility] on [address] in [place] . Patient will call us to schedule once she is back in town

## 2023-04-28 ENCOUNTER — Encounter: Payer: Self-pay | Admitting: Psychiatry

## 2023-04-28 ENCOUNTER — Telehealth (INDEPENDENT_AMBULATORY_CARE_PROVIDER_SITE_OTHER): Payer: 59 | Admitting: Psychiatry

## 2023-04-28 DIAGNOSIS — F1021 Alcohol dependence, in remission: Secondary | ICD-10-CM

## 2023-04-28 DIAGNOSIS — F4310 Post-traumatic stress disorder, unspecified: Secondary | ICD-10-CM | POA: Diagnosis not present

## 2023-04-28 DIAGNOSIS — F3176 Bipolar disorder, in full remission, most recent episode depressed: Secondary | ICD-10-CM | POA: Diagnosis not present

## 2023-04-28 DIAGNOSIS — F17200 Nicotine dependence, unspecified, uncomplicated: Secondary | ICD-10-CM | POA: Diagnosis not present

## 2023-04-28 DIAGNOSIS — F1421 Cocaine dependence, in remission: Secondary | ICD-10-CM

## 2023-04-28 MED ORDER — LURASIDONE HCL 20 MG PO TABS: 20.0000 mg | ORAL_TABLET | Freq: Every day | ORAL | 3 refills | Status: DC

## 2023-04-28 NOTE — Progress Notes (Signed)
Virtual Visit via Telephone Note  I connected with Susan [name] on 04/28/23 at  8:30 AM EST by telephone and verified that I am speaking with the correct person using two identifiers.  Location Provider Location : [facility] Patient Location : Brother[facility] Home  Participants: Patient , Provider    I discussed the limitations, risks, security and privacy concerns of performing an evaluation and management service by telephone and the availability of in person appointments. I also discussed with the patient that there may be a patient responsible charge related to this service. The patient expressed understanding and agreed to proceed.   I discussed the assessment and treatment plan with the patient. The patient was provided an opportunity to ask questions and all were answered. The patient agreed with the plan and demonstrated an understanding of the instructions.   The patient was advised to call back or seek an in-person evaluation if the symptoms worsen or if the condition fails to improve as anticipated.   BH MD OP Progress Note  04/28/2023 9:40 AM EZRAH RITTENOUR  MRN:  [id number]  Chief Complaint:  Chief Complaint  Patient presents with   Follow-up   Depression   Anxiety   Medication Refill   HPI: Susan [name] is a 62 year old Caucasian female, currently unemployed, widowed, lives currently in [place] with her brother has a history of bipolar disorder, PTSD, alcohol and cocaine use disorder in remission, tobacco use disorder, bereavement, degenerative disk disease, obstructive sleep apnea on CPAP, history of total hip replacement, osteoarthritis, hepatitis C was evaluated by phone today.  Patient today reports she is currently living with her brother moved 2 weeks ago.  She is currently trying to find an apartment in [place] so she can settle down there.  Patient reports she could not handle the stress of having her son live with her in [place] since he continued to  decompensate with regards to his mental health/substance abuse problems.  He is currently living out of his car.  Patient also could not use her walker to go down the stairs in her previous home as well as could not maintain her yard herself.  Patient reports she continues to go for [employer] and stays sober.  She was also able to quit smoking 2 weeks ago and is proud of herself.  She continues to use a vape.  Patient denies any significant mood swings, depression or anxiety symptoms.  Current medications like Latuda and Cymbalta are effective.  Denies side effects.  Patient reports sleep as improved.  She denies any suicidality, homicidality or perceptual disturbances.  Patient denies any other concerns today.  Visit Diagnosis:    ICD-10-CM   1. Bipolar disorder, in full remission, most recent episode depressed (HCC)  F31.76 lurasidone (LATUDA) 20 MG TABS tablet   Type I    2. PTSD (post-traumatic stress disorder)  F43.10     3. Tobacco use disorder  F17.200    in early remission    4. Alcohol use disorder, severe, in sustained remission (HCC)  F10.21     5. Cocaine use disorder, moderate, in sustained remission (HCC)  F14.21       Past Psychiatric History: I have reviewed past psychiatric history from progress note on 09/27/2019.  Past trials of Seroquel, Cymbalta, Abilify, risperidone.  Past Medical History:  Past Medical History:  Diagnosis Date   ADD (attention deficit disorder)    Alcohol abuse    Anemia    Anxiety    Aortic  atherosclerosis (HCC) 02/20/2018   Chest CT Sept 2019   Arthritis    rheumatoid arthritis   Asthma    Bipolar disorder (HCC)    Centrilobular emphysema (HCC)    Cocaine use disorder, moderate, in sustained remission (HCC)    Constipation    Degenerative disc disease at L5-S1 level 09/28/2016   See ortho note May 2018   Depression    bipolar, hx of suicide attempt   Diabetes mellitus, type 2 (HCC)    Diverticulitis 2013   DOE (dyspnea on  exertion)    Drug use    Family history of adverse reaction to anesthesia    mom-delayed emergence   GERD (gastroesophageal reflux disease)    H/O suicide attempt    slit wrists   Hepatitis C 06/26/2014   treated with Harvoni   Hip pain    History of echocardiogram    a. 04/2019 Echo: EF >65%, nl RV fxn.   History of MRSA infection 2013   HLD (hyperlipidemia)    Hypertension    a. 06/2021 Renal Duplex: ? bilat RAS; b. 06/2021 CTA Abd: No signif RAS.   Hyponatremia    Hypothyroidism    Incisional hernia 11/08/2012   Knee pain    Left carotid bruit    Morbid obesity (HCC)    Multinodular thyroid    OSA (obstructive sleep apnea)    OSA on CPAP    Osteoporosis    Palpitations    Post-traumatic osteoarthritis of right knee 09/24/2015   Recurrent ventral hernia 11/08/2012   Status post total right knee replacement using cement 05/10/2016   Stroke (HCC) 07/05/2022   right arm,torso and right leg numbness   Tobacco use disorder    Vitamin B12 deficiency    Vitamin D deficiency disease     Past Surgical History:  Procedure Laterality Date   BILATERAL SALPINGOOPHORECTOMY     due to abnormal mass   BREAST BIOPSY Left    neg   BREAST SURGERY Left 20 yrs ago   CESAREAN SECTION     COLONOSCOPY     COLONOSCOPY WITH PROPOFOL N/A 10/16/2017   Procedure: COLONOSCOPY WITH PROPOFOL;  Surgeon: Wyline Mood, MD;  Location: [facility] ENDOSCOPY;  Service: Gastroenterology;  Laterality: N/A;   COLONOSCOPY WITH PROPOFOL N/A 02/16/2021   Procedure: COLONOSCOPY WITH PROPOFOL;  Surgeon: Wyline Mood, MD;  Location: [facility] ENDOSCOPY;  Service: Gastroenterology;  Laterality: N/A;   FORAMINOTOMY 1 LEVEL Right 10/10/2022   Procedure: RIGHT L4-5 LAMINOFORAMINOTOMY;  Surgeon: Venetia Night, MD;  Location: [facility] ORS;  Service: Neurosurgery;  Laterality: Right;   HERNIA REPAIR  06/2011, July 2014   Ventral wall repair with Physiomesh   HERNIA REPAIR     2nd.vental wall repair   JOINT REPLACEMENT Right     knee   LUMBAR LAMINECTOMY/ DECOMPRESSION WITH MET-RX N/A 10/12/2022   Procedure: LUMBAR LAMINECTOMY/ DECOMPRESSION WITH MET-RX;  Surgeon: Venetia Night, MD;  Location: [facility] ORS;  Service: Neurosurgery;  Laterality: N/A;   TONSILLECTOMY     TOTAL HIP ARTHROPLASTY Left 07/23/2019   Procedure: TOTAL HIP ARTHROPLASTY;  Surgeon: Christena Flake, MD;  Location: [facility] ORS;  Service: Orthopedics;  Laterality: Left;   TOTAL KNEE ARTHROPLASTY Right 05/10/2016   Procedure: TOTAL KNEE ARTHROPLASTY;  Surgeon: Christena Flake, MD;  Location: [facility] ORS;  Service: Orthopedics;  Laterality: Right;   TUBAL LIGATION      Family Psychiatric History: I have reviewed family psychiatric history from progress note on 09/27/2019.  Family History:  Family  History  Problem Relation Age of Onset   Arthritis Mother    Asthma Mother    Mental illness Mother    Thyroid disease Mother    COPD Mother    Heart disease Mother    Congestive Heart Failure Mother    Alcohol abuse Mother    Eating disorder Mother    Bipolar disorder Mother    Alcohol abuse Father    Heart attack Father    Alcohol abuse Sister    Drug abuse Sister    Mental illness Sister    Mental illness Sister    Fibromyalgia Sister    Obesity Sister    Pneumonia Sister    Depression Sister    Mental illness Sister    Alcohol abuse Sister    Drug abuse Sister    Arthritis Brother    Mental illness Brother    Cancer Brother        non-hodkins lymphoma   Drug abuse Daughter    Drug abuse Son    Alcohol abuse Son    Drug abuse Son    Alcohol abuse Son    Diabetes Neg Hx    Stroke Neg Hx    Breast cancer Neg Hx    Thyroid cancer Neg Hx     Social History: I have reviewed social history from progress note on 09/27/2019. Social History   Socioeconomic History   Marital status: Widowed    Spouse name: Not on file   Number of children: 3   Years of education: Not on file   Highest education level: Some college, no degree  Occupational  History   Occupation: disabled  Tobacco Use   Smoking status: Former    Current packs/day: 0.50    Average packs/day: 0.5 packs/day for 41.0 years (20.5 ttl pk-yrs)    Types: Cigarettes   Smokeless tobacco: Never  Vaping Use   Vaping status: Every Day   Substances: Nicotine  Substance and Sexual Activity   Alcohol use: No    Alcohol/week: 0.0 standard drinks of 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   Drug use: No    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   Sexual activity: Not Currently  Other Topics Concern   Not on file  Social History Narrative   Not on file   Social Drivers of Health   Financial Resource Strain: Low Risk  (03/29/2023)   Overall Financial Resource Strain (CARDIA)    Difficulty of Paying Living Expenses: Not hard at all  Food Insecurity: No Food Insecurity (03/29/2023)   Hunger Vital Sign    Worried About Running Out of Food in the Last Year: Never true    Ran Out of Food in the Last Year: Never true  Transportation Needs: No Transportation Needs (03/29/2023)   PRAPARE - Administrator, Civil Service (Medical): No    Lack of Transportation (Non-Medical): No  Physical Activity: Inactive (03/29/2023)   Exercise Vital Sign    Days of Exercise per Week: 0 days    Minutes of Exercise per Session: 0 min  Stress: No Stress Concern Present (03/29/2023)   [employer] of Occupational Health - Occupational Stress Questionnaire    Feeling of Stress : Only a little  Social Connections: Moderately Isolated (03/29/2023)   Social Connection and Isolation Panel [NHANES]    Frequency of Communication with Friends and Family: Twice a week    Frequency of Social Gatherings with Friends and Family: More  than three times a week    Attends Religious Services: Never    Active Member of Clubs or Organizations: Yes    Attends Banker Meetings: More than 4 times per year    Marital Status: Widowed    Allergies:  Allergies   Allergen Reactions   Wellbutrin [Bupropion]     Patient reports made " deathly sick " years ago at appointment 03/18/20.   Furosemide Other (See Comments)    Electrolyte imbalance  Other Reaction(s): Other (See Comments)  Electrolyte imbalance Electrolyte imbalance Electrolyte imbalance    Electrolyte imbalance   Hydrochlorothiazide Other (See Comments)    Electrolyte imbalance  Other Reaction(s): Dizziness, Other (See Comments)    Metabolic Disorder Labs: Lab Results  Component Value Date   HGBA1C 5.4 03/28/2023   MPG 108 04/08/2019   MPG 103 08/16/2017   No results found for: "PROLACTIN" Lab Results  Component Value Date   CHOL 211 (H) 07/06/2022   TRIG 166 (H) 07/06/2022   HDL 50 07/06/2022   CHOLHDL 4.2 07/06/2022   VLDL 33 07/06/2022   LDLCALC 128 (H) 07/06/2022   LDLCALC 108 (H) 06/28/2022   Lab Results  Component Value Date   TSH 1.06 03/28/2023   TSH 1.15 04/28/2021    Therapeutic Level Labs: No results found for: "LITHIUM" No results found for: "VALPROATE" No results found for: "CBMZ"  Current Medications: Current Outpatient Medications  Medication Sig Dispense Refill   acetaminophen (TYLENOL) 500 MG tablet Take 1,000 mg by mouth 2 (two) times daily.     albuterol (VENTOLIN HFA) 108 (90 Base) MCG/ACT inhaler Inhale 2 puffs into the lungs every 6 (six) hours as needed for wheezing or shortness of breath. 8 g 2   aspirin EC 81 MG tablet Take by mouth.     carvedilol (COREG) 12.5 MG tablet Take 12.5 mg by mouth 2 (two) times daily. (Patient not taking: Reported on 03/29/2023)     celecoxib (CELEBREX) 100 MG capsule TAKE 1 CAPSULE(100 MG) BY MOUTH TWICE DAILY 60 capsule 0   clotrimazole (LOTRIMIN) 1 % cream Apply 1 application  topically 2 (two) times daily as needed.     cyclobenzaprine (FLEXERIL) 5 MG tablet Take 5 mg by mouth 2 (two) times daily.     docusate sodium (COLACE) 100 MG capsule Take 1 capsule (100 mg total) by mouth daily. Take with  breakfast 30 capsule 5   DULoxetine (CYMBALTA) 20 MG capsule TAKE ONE CAPSULE BY MOUTH DAILY WITH 30 MG 30 capsule 5   DULoxetine (CYMBALTA) 30 MG capsule TAKE ONE CAPSULE BY MOUTH DAILY WITH 20 MG CAPSULE 30 capsule 5   HYDROcodone-acetaminophen (NORCO/VICODIN) 5-325 MG tablet Take 1 tablet by mouth daily as needed for moderate pain.     lurasidone (LATUDA) 20 MG TABS tablet Take 1 tablet (20 mg total) by mouth daily with supper. 30 tablet 3   naloxone (NARCAN) nasal spray 4 mg/0.1 mL Place into the nose.     pregabalin (LYRICA) 75 MG capsule Take 2 capsules (150 mg total) by mouth 2 (two) times daily. 120 capsule 1   rosuvastatin (CRESTOR) 20 MG tablet Take 1 tablet (20 mg total) by mouth daily. 90 tablet 3   Semaglutide, 2 MG/DOSE, 8 MG/3ML SOPN Inject 2 mg as directed once a week. 3 mL 2   spironolactone (ALDACTONE) 25 MG tablet TAKE ONE TABLET BY MOUTH DAILY 90 tablet 1   telmisartan (MICARDIS) 80 MG tablet TAKE ONE TABLET BY MOUTH DAILY 90 tablet  0   traZODone (DESYREL) 50 MG tablet TAKE 1 TABLET(50 MG) BY MOUTH AT BEDTIME 30 tablet 2   Vibegron (GEMTESA) 75 MG TABS Take 1 tablet (75 mg total) by mouth daily. 30 tablet 5   Vitamin D, Ergocalciferol, (DRISDOL) 1.25 MG (50000 UNIT) CAPS capsule TAKE ONE CAPSULE BY MOUTH ONCE WEEKLY ON SUNDAY 13 capsule 1   No current facility-administered medications for this visit.     Musculoskeletal: Strength & Muscle Tone:  UTA Gait & Station:  UTA Patient leans: N/A  Psychiatric Specialty Exam: Review of Systems  Psychiatric/Behavioral: Negative.      There were no vitals taken for this visit.There is no height or weight on file to calculate BMI.  General Appearance:  UTA  Eye Contact:   UTA  Speech:  Clear and Coherent  Volume:  Normal  Mood:  Euthymic  Affect:   UTA  Thought Process:  Goal Directed and Descriptions of Associations: Intact  Orientation:  Full (Time, Place, and Person)  Thought Content: Logical   Suicidal Thoughts:  No   Homicidal Thoughts:  No  Memory:  Immediate;   Fair Recent;   Fair Remote;   Fair  Judgement:  Fair  Insight:  Fair  Psychomotor Activity:   UTA  Concentration:  Concentration: Fair and Attention Span: Fair  Recall:  [employer] of Knowledge: Fair  Language: Fair  Akathisia:  No  Handed:  Right  AIMS (if indicated): not done  Assets:  Desire for Improvement Housing Social Support  ADL's:  Intact  Cognition: WNL  Sleep:  Fair   Screenings: AIMS    Flowsheet Row Video Visit from 03/24/2022 in [facility] Video Visit from 01/20/2022 in [facility] Video Visit from 10/21/2021 in [facility]  AIMS Total Score 0 0 0      GAD-7    Flowsheet Row Office Visit from 03/28/2023 in [facility] [employer] Visit from 12/02/2022 in [facility] [employer] Visit from 11/21/2022 in [facility] [employer] Visit from 09/28/2022 in [facility] [employer] Visit from 08/31/2022 in [facility]  Total GAD-7 Score 5 6 2 4 4       PHQ2-9    Flowsheet Row Clinical Support from 03/29/2023 in [facility] Visit from 03/28/2023 in [facility] [employer] Visit from 02/13/2023 in [facility] Office Visit from 12/28/2022 in [facility] Office Visit from 12/02/2022 in [facility] Total Score 1 1 2 2 2   PHQ-9 Total Score 5 6 4 4 8       Flowsheet Row Video Visit from 04/28/2023 in [facility] Video Visit from 01/12/2023 in  [facility] Video Visit from 11/29/2022 in [facility]  C-SSRS RISK CATEGORY Moderate Risk Low Risk Low Risk        Assessment and Plan: LERA BUNDRICK is a 62 year old Caucasian female who has a history of bipolar disorder, GAD, degenerative disk disease, history of recent CVA, multiple medical problems including back surgery was evaluated by phone today.  Patient with recent relocation to [facility], continues to remain stable on the current medication regimen, discussed assessment and plan as noted below.  Bipolar disorder type I depressed in remission Currently managed on the current combination of medication.  Denies no new concerns, agreeable to continue the current regimen. - Continue Latuda 20 mg daily with supper. - Continue Lyrica which is a mood stabilizer although prescribed for pain.  PTSD-stable Patient currently reports mood symptoms as stable and denies any intrusive memories flashbacks, nightmares.  Sleep also is improving. - Continue Cymbalta 50 mg p.o. daily   Tobacco use disorder in early remission Quit smoking 2 weeks ago. - Will continue to encourage to stay away from smoking. - Consider weaning off using the vape gradually.  Alcohol/cocaine use disorder in remission Patient continues to attend AA meetings and remain sober.  Has been sober since the past more than 5 years. - Encouraged to continue AA meetings.     Consent: Patient/Guardian gives verbal consent for treatment and assignment of benefits for services provided during this visit. Patient/Guardian expressed understanding and agreed to proceed.    I have spent at least 14 minutes non face to face with patient today.  This note was generated in part or whole with voice recognition software. Voice recognition is usually quite accurate but there are transcription errors that can and very often do occur. I apologize for  any typographical errors that were not detected and corrected.     Jomarie Longs, MD 04/28/2023, 9:40 AM

## 2023-05-01 ENCOUNTER — Encounter: Payer: Self-pay | Admitting: Physical Medicine and Rehabilitation

## 2023-05-01 DIAGNOSIS — G834 Cauda equina syndrome: Secondary | ICD-10-CM | POA: Diagnosis not present

## 2023-05-01 DIAGNOSIS — R29898 Other symptoms and signs involving the musculoskeletal system: Secondary | ICD-10-CM | POA: Diagnosis not present

## 2023-05-11 DIAGNOSIS — G834 Cauda equina syndrome: Secondary | ICD-10-CM | POA: Diagnosis not present

## 2023-05-11 DIAGNOSIS — R29898 Other symptoms and signs involving the musculoskeletal system: Secondary | ICD-10-CM | POA: Diagnosis not present

## 2023-05-11 MED ORDER — PREGABALIN 150 MG PO CAPS
150.0000 mg | ORAL_CAPSULE | Freq: Two times a day (BID) | ORAL | 6 refills | Status: DC
Start: 2023-05-11 — End: 2023-12-12

## 2023-05-11 NOTE — Telephone Encounter (Signed)
 Reached out to patient regarding prior request for medication refills with no response.  She states that there was a cable cut to her apartment and she is currently on a list for new housing, however the wait time is approximately 4 to 6 months.  In the meantime, she is living in [place] with her brother, and is working on getting her medications in [place] and getting follow-ups with her doctors in order.  Reviewed patient's blood work from 1 month prior, called back and discussed diagnosis of CKD with recent referral to nephrology.  Based on max dosing parameters, should be okay to continue Lyrica  at 150 mg twice daily, however advised patient that I will not be refilling Celebrex  and she should not be taking any NSAIDs based on her current creatinine clearance.  She is understanding and appreciative of this.  Will send 6 months of medication Lyrica  150 mg twice daily to [facility] in [place], [place] .  Patient will call when she is back in [place] so that we can transfer the refills back to her home pharmacy.   Joesph JAYSON Likes, DO 05/11/2023

## 2023-05-11 NOTE — Addendum Note (Signed)
 Addended by: Elijah Birk on: 05/11/2023 01:31 PM   Modules accepted: Orders

## 2023-05-15 DIAGNOSIS — G834 Cauda equina syndrome: Secondary | ICD-10-CM | POA: Diagnosis not present

## 2023-05-15 DIAGNOSIS — R29898 Other symptoms and signs involving the musculoskeletal system: Secondary | ICD-10-CM | POA: Diagnosis not present

## 2023-05-19 ENCOUNTER — Other Ambulatory Visit: Payer: Self-pay | Admitting: Orthopedic Surgery

## 2023-05-19 DIAGNOSIS — Z9889 Other specified postprocedural states: Secondary | ICD-10-CM

## 2023-05-19 DIAGNOSIS — R29898 Other symptoms and signs involving the musculoskeletal system: Secondary | ICD-10-CM | POA: Diagnosis not present

## 2023-05-19 DIAGNOSIS — G834 Cauda equina syndrome: Secondary | ICD-10-CM | POA: Diagnosis not present

## 2023-05-19 DIAGNOSIS — M5416 Radiculopathy, lumbar region: Secondary | ICD-10-CM

## 2023-05-19 NOTE — Telephone Encounter (Signed)
 She was advised last month to get further celebrex refills from her PCP. Please let her know.    Celebrex refill from pharmacy sent back denied.

## 2023-05-19 NOTE — Telephone Encounter (Signed)
 Spoke with patient she was not aware the pharmacy sent this to our office. She did speak with PCP about this medication but they also denied due to kidney function just FYI. Patient does not need anything.

## 2023-05-29 DIAGNOSIS — M5126 Other intervertebral disc displacement, lumbar region: Secondary | ICD-10-CM | POA: Diagnosis not present

## 2023-05-29 DIAGNOSIS — M5412 Radiculopathy, cervical region: Secondary | ICD-10-CM | POA: Diagnosis not present

## 2023-05-29 DIAGNOSIS — M5416 Radiculopathy, lumbar region: Secondary | ICD-10-CM | POA: Diagnosis not present

## 2023-05-29 DIAGNOSIS — M51362 Other intervertebral disc degeneration, lumbar region with discogenic back pain and lower extremity pain: Secondary | ICD-10-CM | POA: Diagnosis not present

## 2023-05-29 DIAGNOSIS — M79671 Pain in right foot: Secondary | ICD-10-CM | POA: Diagnosis not present

## 2023-06-02 DIAGNOSIS — R29898 Other symptoms and signs involving the musculoskeletal system: Secondary | ICD-10-CM | POA: Diagnosis not present

## 2023-06-02 DIAGNOSIS — G834 Cauda equina syndrome: Secondary | ICD-10-CM | POA: Diagnosis not present

## 2023-06-07 DIAGNOSIS — G834 Cauda equina syndrome: Secondary | ICD-10-CM | POA: Diagnosis not present

## 2023-06-07 DIAGNOSIS — R29898 Other symptoms and signs involving the musculoskeletal system: Secondary | ICD-10-CM | POA: Diagnosis not present

## 2023-06-09 ENCOUNTER — Other Ambulatory Visit: Payer: Self-pay | Admitting: Family Medicine

## 2023-06-09 DIAGNOSIS — E119 Type 2 diabetes mellitus without complications: Secondary | ICD-10-CM

## 2023-06-09 DIAGNOSIS — G834 Cauda equina syndrome: Secondary | ICD-10-CM | POA: Diagnosis not present

## 2023-06-09 DIAGNOSIS — R29898 Other symptoms and signs involving the musculoskeletal system: Secondary | ICD-10-CM | POA: Diagnosis not present

## 2023-06-09 DIAGNOSIS — I10 Essential (primary) hypertension: Secondary | ICD-10-CM

## 2023-06-14 DIAGNOSIS — G834 Cauda equina syndrome: Secondary | ICD-10-CM | POA: Diagnosis not present

## 2023-06-14 DIAGNOSIS — R29898 Other symptoms and signs involving the musculoskeletal system: Secondary | ICD-10-CM | POA: Diagnosis not present

## 2023-06-16 DIAGNOSIS — R29898 Other symptoms and signs involving the musculoskeletal system: Secondary | ICD-10-CM | POA: Diagnosis not present

## 2023-06-16 DIAGNOSIS — G834 Cauda equina syndrome: Secondary | ICD-10-CM | POA: Diagnosis not present

## 2023-06-21 DIAGNOSIS — R29898 Other symptoms and signs involving the musculoskeletal system: Secondary | ICD-10-CM | POA: Diagnosis not present

## 2023-06-21 DIAGNOSIS — G834 Cauda equina syndrome: Secondary | ICD-10-CM | POA: Diagnosis not present

## 2023-06-23 DIAGNOSIS — G834 Cauda equina syndrome: Secondary | ICD-10-CM | POA: Diagnosis not present

## 2023-06-23 DIAGNOSIS — R29898 Other symptoms and signs involving the musculoskeletal system: Secondary | ICD-10-CM | POA: Diagnosis not present

## 2023-06-28 DIAGNOSIS — G834 Cauda equina syndrome: Secondary | ICD-10-CM | POA: Diagnosis not present

## 2023-06-28 DIAGNOSIS — R29898 Other symptoms and signs involving the musculoskeletal system: Secondary | ICD-10-CM | POA: Diagnosis not present

## 2023-06-30 DIAGNOSIS — R29898 Other symptoms and signs involving the musculoskeletal system: Secondary | ICD-10-CM | POA: Diagnosis not present

## 2023-06-30 DIAGNOSIS — G834 Cauda equina syndrome: Secondary | ICD-10-CM | POA: Diagnosis not present

## 2023-07-05 DIAGNOSIS — R29898 Other symptoms and signs involving the musculoskeletal system: Secondary | ICD-10-CM | POA: Diagnosis not present

## 2023-07-05 DIAGNOSIS — G834 Cauda equina syndrome: Secondary | ICD-10-CM | POA: Diagnosis not present

## 2023-07-07 DIAGNOSIS — R29898 Other symptoms and signs involving the musculoskeletal system: Secondary | ICD-10-CM | POA: Diagnosis not present

## 2023-07-07 DIAGNOSIS — G834 Cauda equina syndrome: Secondary | ICD-10-CM | POA: Diagnosis not present

## 2023-07-10 ENCOUNTER — Other Ambulatory Visit: Payer: Self-pay | Admitting: Physical Medicine and Rehabilitation

## 2023-07-10 DIAGNOSIS — M19071 Primary osteoarthritis, right ankle and foot: Secondary | ICD-10-CM | POA: Diagnosis not present

## 2023-07-10 DIAGNOSIS — I96 Gangrene, not elsewhere classified: Secondary | ICD-10-CM | POA: Diagnosis not present

## 2023-07-10 DIAGNOSIS — M25571 Pain in right ankle and joints of right foot: Secondary | ICD-10-CM | POA: Diagnosis not present

## 2023-07-10 DIAGNOSIS — M5416 Radiculopathy, lumbar region: Secondary | ICD-10-CM | POA: Diagnosis not present

## 2023-07-10 DIAGNOSIS — M79672 Pain in left foot: Secondary | ICD-10-CM | POA: Diagnosis not present

## 2023-07-10 DIAGNOSIS — M79671 Pain in right foot: Secondary | ICD-10-CM | POA: Diagnosis not present

## 2023-07-10 DIAGNOSIS — M19072 Primary osteoarthritis, left ankle and foot: Secondary | ICD-10-CM | POA: Diagnosis not present

## 2023-07-10 DIAGNOSIS — M79674 Pain in right toe(s): Secondary | ICD-10-CM | POA: Diagnosis not present

## 2023-07-14 DIAGNOSIS — N390 Urinary tract infection, site not specified: Secondary | ICD-10-CM | POA: Diagnosis not present

## 2023-07-20 ENCOUNTER — Encounter: Payer: Self-pay | Admitting: Nurse Practitioner

## 2023-07-21 DIAGNOSIS — I96 Gangrene, not elsewhere classified: Secondary | ICD-10-CM | POA: Diagnosis not present

## 2023-07-28 ENCOUNTER — Telehealth: Payer: 59 | Admitting: Psychiatry

## 2023-07-28 ENCOUNTER — Encounter: Payer: Self-pay | Admitting: Psychiatry

## 2023-07-28 DIAGNOSIS — F3176 Bipolar disorder, in full remission, most recent episode depressed: Secondary | ICD-10-CM

## 2023-07-28 DIAGNOSIS — F1021 Alcohol dependence, in remission: Secondary | ICD-10-CM | POA: Diagnosis not present

## 2023-07-28 DIAGNOSIS — F4310 Post-traumatic stress disorder, unspecified: Secondary | ICD-10-CM

## 2023-07-28 DIAGNOSIS — F17210 Nicotine dependence, cigarettes, uncomplicated: Secondary | ICD-10-CM

## 2023-07-28 DIAGNOSIS — F17200 Nicotine dependence, unspecified, uncomplicated: Secondary | ICD-10-CM

## 2023-07-28 DIAGNOSIS — F1421 Cocaine dependence, in remission: Secondary | ICD-10-CM

## 2023-07-28 NOTE — Progress Notes (Signed)
 Virtual Visit via Video Note  I connected with Susan [name] on 07/28/23 at  9:00 AM EDT by a video enabled telemedicine application and verified that I am speaking with the correct person using two identifiers.  Location Provider Location : [facility] Patient Location : Home  Participants: Patient , Provider    I discussed the limitations of evaluation and management by telemedicine and the availability of in person appointments. The patient expressed understanding and agreed to proceed.   I discussed the assessment and treatment plan with the patient. The patient was provided an opportunity to ask questions and all were answered. The patient agreed with the plan and demonstrated an understanding of the instructions.   The patient was advised to call back or seek an in-person evaluation if the symptoms worsen or if the condition fails to improve as anticipated.  BH MD OP Progress Note  07/28/2023 8:16 PM Susan [name]  MRN:  [id number]  Chief Complaint:  Chief Complaint  Patient presents with   Depression   Anxiety   Medication Refill   Follow-up   HPI: Susan [name] is a 63 year old Caucasian female, currently unemployed, widowed, lives in [place] with her brother, has a history of bipolar disorder, PTSD, tobacco use disorder, alcohol use disorder in remission, cocaine use disorder in remission, bereavement, degenerative disk disease, obstructive sleep apnea on CPAP, history of total hip replacement, osteoarthritis, hepatitis C was evaluated by telemedicine today.  She has completed physical therapy and is now performing exercises at the gym to strengthen her legs. She uses a cane indoors and aims to use it outside soon. Her unsteady gait is attributed to back pain, had back surgery in June, followed by a complication of a blood clot on the spine that required additional surgery.  She has a history of bipolar disorder, currently in remission, and is taking Latuda 20 mg daily  with supper and Cymbalta 50 mg, with no reported side effects. PTSD is stable. She has quit smoking and is not using alcohol or other drugs, expressing surprise at not being 'a boring person without all of that.'  Her memory is improving, although some forgetfulness persists. She manages daily activities better by making lists and has recently started reading books again, which she had not done in about eight years due to personal losses.  She has chronic kidney problems with a creatinine level of 1.48 and a GFR of 37, which have been stable. She is coordinating with her providers for follow-up blood work. Her hemoglobin A1c was 5.4 in November 2024, and her thyroid function was normal at 1.06.  Socially, she lives with her brother and is on a waiting list for an apartment for those aged 45 and over. She attends AA meetings and visits [employer] but has not formed new friendships locally, citing that people are 'not really the nicest.' She maintains contact with friends from [place] and [place] and has socialized with her brother's neighbors.  She denies any suicidality, homicidality or perceptual disturbances.  Visit Diagnosis:    ICD-10-CM   1. Bipolar disorder, in full remission, most recent episode depressed (HCC)  F31.76     2. PTSD (post-traumatic stress disorder)  F43.10     3. Tobacco use disorder  F17.200     4. Alcohol use disorder, severe, in sustained remission (HCC)  F10.21     5. Cocaine use disorder, moderate, in sustained remission (HCC)  F14.21       Past Psychiatric History: I  have reviewed past psychiatric history from progress note on 09/27/2019.  Past trials of Seroquel, Cymbalta, Abilify, risperidone.  Past Medical History:  Past Medical History:  Diagnosis Date   ADD (attention deficit disorder)    Alcohol abuse    Anemia    Anxiety    Aortic atherosclerosis (HCC) 02/20/2018   Chest CT Sept 2019   Arthritis    rheumatoid arthritis   Asthma    Bipolar  disorder (HCC)    Centrilobular emphysema (HCC)    Cocaine use disorder, moderate, in sustained remission (HCC)    Constipation    Degenerative disc disease at L5-S1 level 09/28/2016   See ortho note May 2018   Depression    bipolar, hx of suicide attempt   Diabetes mellitus, type 2 (HCC)    Diverticulitis 2013   DOE (dyspnea on exertion)    Drug use    Family history of adverse reaction to anesthesia    mom-delayed emergence   GERD (gastroesophageal reflux disease)    H/O suicide attempt    slit wrists   Hepatitis C 06/26/2014   treated with Harvoni   Hip pain    History of echocardiogram    a. 04/2019 Echo: EF >65%, nl RV fxn.   History of MRSA infection 2013   HLD (hyperlipidemia)    Hypertension    a. 06/2021 Renal Duplex: ? bilat RAS; b. 06/2021 CTA Abd: No signif RAS.   Hyponatremia    Hypothyroidism    Incisional hernia 11/08/2012   Knee pain    Left carotid bruit    Morbid obesity (HCC)    Multinodular thyroid    OSA (obstructive sleep apnea)    OSA on CPAP    Osteoporosis    Palpitations    Post-traumatic osteoarthritis of right knee 09/24/2015   Recurrent ventral hernia 11/08/2012   Status post total right knee replacement using cement 05/10/2016   Stroke (HCC) 07/05/2022   right arm,torso and right leg numbness   Tobacco use disorder    Vitamin B12 deficiency    Vitamin D deficiency disease     Past Surgical History:  Procedure Laterality Date   BILATERAL SALPINGOOPHORECTOMY     due to abnormal mass   BREAST BIOPSY Left    neg   BREAST SURGERY Left 20 yrs ago   CESAREAN SECTION     COLONOSCOPY     COLONOSCOPY WITH PROPOFOL N/A 10/16/2017   Procedure: COLONOSCOPY WITH PROPOFOL;  Surgeon: Wyline Mood, MD;  Location: [facility] ENDOSCOPY;  Service: Gastroenterology;  Laterality: N/A;   COLONOSCOPY WITH PROPOFOL N/A 02/16/2021   Procedure: COLONOSCOPY WITH PROPOFOL;  Surgeon: Wyline Mood, MD;  Location: [facility] ENDOSCOPY;  Service: Gastroenterology;  Laterality:  N/A;   FORAMINOTOMY 1 LEVEL Right 10/10/2022   Procedure: RIGHT L4-5 LAMINOFORAMINOTOMY;  Surgeon: Venetia Night, MD;  Location: [facility] ORS;  Service: Neurosurgery;  Laterality: Right;   HERNIA REPAIR  06/2011, July 2014   Ventral wall repair with Physiomesh   HERNIA REPAIR     2nd.vental wall repair   JOINT REPLACEMENT Right    knee   LUMBAR LAMINECTOMY/ DECOMPRESSION WITH MET-RX N/A 10/12/2022   Procedure: LUMBAR LAMINECTOMY/ DECOMPRESSION WITH MET-RX;  Surgeon: Venetia Night, MD;  Location: [facility] ORS;  Service: Neurosurgery;  Laterality: N/A;   TONSILLECTOMY     TOTAL HIP ARTHROPLASTY Left 07/23/2019   Procedure: TOTAL HIP ARTHROPLASTY;  Surgeon: Christena Flake, MD;  Location: [facility] ORS;  Service: Orthopedics;  Laterality: Left;   TOTAL KNEE ARTHROPLASTY  Right 05/10/2016   Procedure: TOTAL KNEE ARTHROPLASTY;  Surgeon: Christena Flake, MD;  Location: [facility] ORS;  Service: Orthopedics;  Laterality: Right;   TUBAL LIGATION      Family Psychiatric History: I have reviewed family psychiatric history from progress note on 09/27/2019.  Family History:  Family History  Problem Relation Age of Onset   Arthritis Mother    Asthma Mother    Mental illness Mother    Thyroid disease Mother    COPD Mother    Heart disease Mother    Congestive Heart Failure Mother    Alcohol abuse Mother    Eating disorder Mother    Bipolar disorder Mother    Alcohol abuse Father    Heart attack Father    Alcohol abuse Sister    Drug abuse Sister    Mental illness Sister    Mental illness Sister    Fibromyalgia Sister    Obesity Sister    Pneumonia Sister    Depression Sister    Mental illness Sister    Alcohol abuse Sister    Drug abuse Sister    Arthritis Brother    Mental illness Brother    Cancer Brother        non-hodkins lymphoma   Drug abuse Daughter    Drug abuse Son    Alcohol abuse Son    Drug abuse Son    Alcohol abuse Son    Diabetes Neg Hx    Stroke Neg Hx    Breast cancer Neg Hx     Thyroid cancer Neg Hx     Social History: I have reviewed social history from progress note on 09/27/2019. Social History   Socioeconomic History   Marital status: Widowed    Spouse name: Not on file   Number of children: 3   Years of education: Not on file   Highest education level: Some college, no degree  Occupational History   Occupation: disabled  Tobacco Use   Smoking status: Former    Current packs/day: 0.50    Average packs/day: 0.5 packs/day for 41.0 years (20.5 ttl pk-yrs)    Types: Cigarettes   Smokeless tobacco: Never  Vaping Use   Vaping status: Every Day   Substances: Nicotine  Substance and Sexual Activity   Alcohol use: No    Alcohol/week: 0.0 standard drinks of 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   Drug use: No    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   Sexual activity: Not Currently  Other Topics Concern   Not on file  Social History Narrative   Not on file   Social Drivers of Health   Financial Resource Strain: Low Risk  (03/29/2023)   Overall Financial Resource Strain (CARDIA)    Difficulty of Paying Living Expenses: Not hard at all  Food Insecurity: No Food Insecurity (03/29/2023)   Hunger Vital Sign    Worried About Running Out of Food in the Last Year: Never true    Ran Out of Food in the Last Year: Never true  Transportation Needs: No Transportation Needs (03/29/2023)   PRAPARE - Administrator, Civil Service (Medical): No    Lack of Transportation (Non-Medical): No  Physical Activity: Inactive (03/29/2023)   Exercise Vital Sign    Days of Exercise per Week: 0 days    Minutes of Exercise per Session: 0 min  Stress: No Stress Concern Present (03/29/2023)   [employer] of Occupational Health -  Occupational Stress Questionnaire    Feeling of Stress : Only a little  Social Connections: Moderately Isolated (03/29/2023)   Social Connection and Isolation Panel [NHANES]    Frequency of Communication with  Friends and Family: Twice a week    Frequency of Social Gatherings with Friends and Family: More than three times a week    Attends Religious Services: Never    Database administrator or Organizations: Yes    Attends Engineer, structural: More than 4 times per year    Marital Status: Widowed    Allergies:  Allergies  Allergen Reactions   Wellbutrin [Bupropion]     Patient reports made " deathly sick " years ago at appointment 03/18/20.   Furosemide Other (See Comments)    Electrolyte imbalance  Other Reaction(s): Other (See Comments)  Electrolyte imbalance Electrolyte imbalance Electrolyte imbalance    Electrolyte imbalance   Hydrochlorothiazide Other (See Comments)    Electrolyte imbalance  Other Reaction(s): Dizziness, Other (See Comments)    Metabolic Disorder Labs: Lab Results  Component Value Date   HGBA1C 5.4 03/28/2023   MPG 108 04/08/2019   MPG 103 08/16/2017   No results found for: "PROLACTIN" Lab Results  Component Value Date   CHOL 211 (H) 07/06/2022   TRIG 166 (H) 07/06/2022   HDL 50 07/06/2022   CHOLHDL 4.2 07/06/2022   VLDL 33 07/06/2022   LDLCALC 128 (H) 07/06/2022   LDLCALC 108 (H) 06/28/2022   Lab Results  Component Value Date   TSH 1.06 03/28/2023   TSH 1.15 04/28/2021    Therapeutic Level Labs: No results found for: "LITHIUM" No results found for: "VALPROATE" No results found for: "CBMZ"  Current Medications: Current Outpatient Medications  Medication Sig Dispense Refill   acetaminophen (TYLENOL) 500 MG tablet Take 1,000 mg by mouth 2 (two) times daily.     albuterol (VENTOLIN HFA) 108 (90 Base) MCG/ACT inhaler Inhale 2 puffs into the lungs every 6 (six) hours as needed for wheezing or shortness of breath. 8 g 2   aspirin EC 81 MG tablet Take by mouth.     carvedilol (COREG) 12.5 MG tablet Take 12.5 mg by mouth 2 (two) times daily. (Patient not taking: Reported on 03/29/2023)     celecoxib (CELEBREX) 100 MG capsule TAKE 1  CAPSULE(100 MG) BY MOUTH TWICE DAILY 60 capsule 0   clotrimazole (LOTRIMIN) 1 % cream Apply 1 application  topically 2 (two) times daily as needed.     cyclobenzaprine (FLEXERIL) 5 MG tablet Take 5 mg by mouth 2 (two) times daily.     docusate sodium (COLACE) 100 MG capsule Take 1 capsule (100 mg total) by mouth daily. Take with breakfast 30 capsule 5   DULoxetine (CYMBALTA) 20 MG capsule TAKE ONE CAPSULE BY MOUTH DAILY WITH 30 MG 30 capsule 5   DULoxetine (CYMBALTA) 30 MG capsule TAKE ONE CAPSULE BY MOUTH DAILY WITH 20 MG CAPSULE 30 capsule 5   HYDROcodone-acetaminophen (NORCO/VICODIN) 5-325 MG tablet Take 1 tablet by mouth daily as needed for moderate pain.     lurasidone (LATUDA) 20 MG TABS tablet Take 1 tablet (20 mg total) by mouth daily with supper. 30 tablet 3   naloxone (NARCAN) nasal spray 4 mg/0.1 mL Place into the nose.     OZEMPIC, 2 MG/DOSE, 8 MG/3ML SOPN INJECT 2 MG SUBCUTANEOUSLY ONCE WEEKLY AS DIRECTED 3 mL 10   pregabalin (LYRICA) 150 MG capsule Take 1 capsule (150 mg total) by mouth 2 (two) times daily. 60  capsule 6   rosuvastatin (CRESTOR) 20 MG tablet Take 1 tablet (20 mg total) by mouth daily. 90 tablet 3   spironolactone (ALDACTONE) 25 MG tablet TAKE ONE TABLET BY MOUTH DAILY 90 tablet 1   telmisartan (MICARDIS) 80 MG tablet TAKE 1 TABLET BY MOUTH DAILY 30 tablet 10   traZODone (DESYREL) 50 MG tablet TAKE 1 TABLET(50 MG) BY MOUTH AT BEDTIME 30 tablet 2   Vibegron (GEMTESA) 75 MG TABS Take 1 tablet (75 mg total) by mouth daily. 30 tablet 5   Vitamin D, Ergocalciferol, (DRISDOL) 1.25 MG (50000 UNIT) CAPS capsule TAKE ONE CAPSULE BY MOUTH ONCE WEEKLY ON SUNDAY 13 capsule 1   No current facility-administered medications for this visit.     Musculoskeletal: Strength & Muscle Tone:  UTA Gait & Station:  Seated Patient leans: N/A  Psychiatric Specialty Exam: Review of Systems  Psychiatric/Behavioral: Negative.      There were no vitals taken for this visit.There is no  height or weight on file to calculate BMI.  General Appearance: Casual  Eye Contact:  Good  Speech:  Clear and Coherent  Volume:  Normal  Mood:  Euthymic  Affect:  Congruent  Thought Process:  Goal Directed and Descriptions of Associations: Intact  Orientation:  Full (Time, Place, and Person)  Thought Content: Logical   Suicidal Thoughts:  No  Homicidal Thoughts:  No  Memory:  Immediate;   Fair Recent;   Fair Remote;   Fair  Judgement:  Fair  Insight:  Fair  Psychomotor Activity:  Normal  Concentration:  Concentration: Fair and Attention Span: Fair  Recall:  [employer] of Knowledge: Fair  Language: Fair  Akathisia:  No  Handed:  Right  AIMS (if indicated): not done  Assets:  Desire for Improvement Housing Social Support Talents/Skills Transportation  ADL's:  Intact  Cognition: WNL  Sleep:  Fair   Screenings: AIMS    Flowsheet Row Video Visit from 03/24/2022 in [facility] Video Visit from 01/20/2022 in [facility] Video Visit from 10/21/2021 in [facility]  AIMS Total Score 0 0 0      GAD-7    Flowsheet Row Office Visit from 03/28/2023 in [facility] [employer] Visit from 12/02/2022 in [facility] [employer] Visit from 11/21/2022 in [facility] [employer] Visit from 09/28/2022 in [facility] [employer] Visit from 08/31/2022 in [facility]  Total GAD-7 Score 5 6 2 4 4       PHQ2-9    Flowsheet Row Clinical Support from 03/29/2023 in [name] Visit from 03/28/2023 in [facility] [employer] Visit from 02/13/2023 in [facility] Office Visit from  12/28/2022 in [facility] Visit from 12/02/2022 in [facility] Total Score 1 1 2 2 2   PHQ-9 Total Score 5 6 4 4 8       Flowsheet Row Video Visit from 07/28/2023 in [facility] Video Visit from 04/28/2023 in [facility] Video Visit from 01/12/2023 in [facility]  C-SSRS RISK CATEGORY Moderate Risk Moderate Risk Low Risk        Assessment and Plan: Susan Beverley  [name] is a 63 year old Caucasian female with history of bipolar disorder, PTSD, alcoholism and cocaine use disorder in remission was evaluated by telemedicine today.  Discussed assessment and plan as noted below.  Bipolar Disorder in Remission Bipolar disorder is in remission with no significant symptoms of depression or anxiety. Currently on Latuda 20 mg daily with supper and Cymbalta 50 mg, with no reported side effects. Memory and cognitive function are improving, aided by activities such as reading. - Continue Latuda 20 mg daily with supper - Continue Cymbalta 50 mg daily  Post-Traumatic Stress Disorder (PTSD)-stable PTSD symptoms are well-managed. Engaging in activities such as reading and attending AA meetings, which contribute positively to mental health. - Continue current management - Continue Trazodone 50 mg at bedtime as needed.  Tobacco use disorder in remission/Alcohol and Cocaine use disorder in remission Patient currently sober from drugs, alcohol and smoking. - Continue AA meetings.   Consent: Patient/Guardian gives verbal consent for treatment and assignment of benefits for services provided during this visit. Patient/Guardian expressed understanding and agreed to proceed.  Discussed the use of a AI scribe software for clinical note transcription with the patient, who gave verbal consent to proceed.  This  note was generated in part or whole with voice recognition software. Voice recognition is usually quite accurate but there are transcription errors that can and very often do occur. I apologize for any typographical errors that were not detected and corrected.     Jomarie Longs, MD 07/28/2023, 8:16 PM

## 2023-07-31 ENCOUNTER — Other Ambulatory Visit: Payer: Self-pay

## 2023-07-31 DIAGNOSIS — I10 Essential (primary) hypertension: Secondary | ICD-10-CM

## 2023-07-31 NOTE — Telephone Encounter (Signed)
 Copied from CRM [id number]. Topic: Clinical - Medication Refill >> Jul 31, 2023  2:54 PM Eunice Blase wrote: Most Recent Primary Care Visit:  Provider: Sydell Axon C  Department: [facility]  Visit Type: MEDICARE AWV, SEQUENTIAL  Date: 03/29/2023  Medication: spironolactone (ALDACTONE) 25 MG tablet  Has the patient contacted their pharmacy? Yes (Agent: If no, request that the patient contact the pharmacy for the refill. If patient does not wish to contact the pharmacy document the reason why and proceed with request.) (Agent: If yes, when and what did the pharmacy advise?)Pharmacy need approval from PCP.   Is this the correct pharmacy for this prescription? Yes If no, delete pharmacy and type the correct one.  This is the patient's preferred pharmacy:  [id number] [place], [place] - [address] AT [name] [address] [place] [address] Phone: [phone] Fax: [phone]  [facility] - [address] [place] - [address] [place] [address] Phone: [phone] Fax: [phone]    Has the prescription been filled recently? Yes  Is the patient out of the medication? Yes  Has the patient been seen for an appointment in the last year OR does the patient have an upcoming appointment? Yes  Can we respond through MyChart? Yes  Agent: Please be advised that Rx refills may take up to 3 business days. We ask that you follow-up with your pharmacy.

## 2023-08-01 MED ORDER — SPIRONOLACTONE 25 MG PO TABS: 25.0000 mg | ORAL_TABLET | Freq: Every day | ORAL | 0 refills | Status: DC

## 2023-08-08 ENCOUNTER — Other Ambulatory Visit: Payer: Self-pay | Admitting: Psychiatry

## 2023-08-08 DIAGNOSIS — F4310 Post-traumatic stress disorder, unspecified: Secondary | ICD-10-CM

## 2023-08-08 DIAGNOSIS — F3131 Bipolar disorder, current episode depressed, mild: Secondary | ICD-10-CM

## 2023-08-13 ENCOUNTER — Other Ambulatory Visit: Payer: Self-pay | Admitting: Urology

## 2023-08-13 DIAGNOSIS — N3941 Urge incontinence: Secondary | ICD-10-CM

## 2023-08-13 DIAGNOSIS — N319 Neuromuscular dysfunction of bladder, unspecified: Secondary | ICD-10-CM

## 2023-08-14 DIAGNOSIS — G4733 Obstructive sleep apnea (adult) (pediatric): Secondary | ICD-10-CM | POA: Diagnosis not present

## 2023-08-14 DIAGNOSIS — N390 Urinary tract infection, site not specified: Secondary | ICD-10-CM | POA: Diagnosis not present

## 2023-08-15 ENCOUNTER — Telehealth: Payer: Self-pay

## 2023-08-15 NOTE — Telephone Encounter (Signed)
-----   Message from Donnita Falls sent at 08/14/2023  3:06 PM EDT ----- Please let pt know refill request denied for [facility] - office visit required for follow up. Last seen in October 2024 with plan to follow up in 6 weeks.

## 2023-08-15 NOTE — Telephone Encounter (Signed)
 Patient called and made aware. Patient state's she is currently out of town awaiting apartment due to she is homeless and not sure when she will return. Patient state's when she  return she will call to make an appointment with Sarah for follow up. Voiced understanding.

## 2023-08-28 DIAGNOSIS — R2689 Other abnormalities of gait and mobility: Secondary | ICD-10-CM | POA: Diagnosis not present

## 2023-08-28 DIAGNOSIS — M5126 Other intervertebral disc displacement, lumbar region: Secondary | ICD-10-CM | POA: Diagnosis not present

## 2023-08-28 DIAGNOSIS — M5412 Radiculopathy, cervical region: Secondary | ICD-10-CM | POA: Diagnosis not present

## 2023-08-28 DIAGNOSIS — M7061 Trochanteric bursitis, right hip: Secondary | ICD-10-CM | POA: Diagnosis not present

## 2023-08-28 DIAGNOSIS — M48062 Spinal stenosis, lumbar region with neurogenic claudication: Secondary | ICD-10-CM | POA: Diagnosis not present

## 2023-08-28 DIAGNOSIS — M1611 Unilateral primary osteoarthritis, right hip: Secondary | ICD-10-CM | POA: Diagnosis not present

## 2023-08-28 DIAGNOSIS — Z79899 Other long term (current) drug therapy: Secondary | ICD-10-CM | POA: Diagnosis not present

## 2023-08-28 DIAGNOSIS — M5416 Radiculopathy, lumbar region: Secondary | ICD-10-CM | POA: Diagnosis not present

## 2023-08-30 ENCOUNTER — Other Ambulatory Visit: Payer: Self-pay

## 2023-08-30 ENCOUNTER — Other Ambulatory Visit: Payer: Self-pay | Admitting: Nurse Practitioner

## 2023-08-30 DIAGNOSIS — I10 Essential (primary) hypertension: Secondary | ICD-10-CM

## 2023-08-30 NOTE — Telephone Encounter (Signed)
 Copied from CRM [id number]. Topic: Clinical - Medication Refill >> Aug 30, 2023  1:55 PM Annelle Kiel wrote: Most Recent Primary Care Visit:  Provider: Felicitas Horse C  Department: [facility]  Visit Type: MEDICARE AWV, SEQUENTIAL  Date: 03/29/2023  Medication: spironolactone  25 mg   Has the patient contacted their pharmacy? Yes (Agent: If no, request that the patient contact the pharmacy for the refill. If patient does not wish to contact the pharmacy document the reason why and proceed with request.) (Agent: If yes, when and what did the pharmacy advise?)  Is this the correct pharmacy for this prescription? Yes If no, delete pharmacy and type the correct one.  This is the patient's preferred pharmacy:  [facility], [place] - [address] [place] [address] Phone: [phone] Fax: [phone]   Has the prescription been filled recently? No  Is the patient out of the medication? Yes  Has the patient been seen for an appointment in the last year OR does the patient have an upcoming appointment? Yes  Can we respond through MyChart? No  Agent: Please be advised that Rx refills may take up to 3 business days. We ask that you follow-up with your pharmacy.

## 2023-09-06 ENCOUNTER — Other Ambulatory Visit: Payer: Self-pay | Admitting: Psychiatry

## 2023-09-06 DIAGNOSIS — F4310 Post-traumatic stress disorder, unspecified: Secondary | ICD-10-CM

## 2023-09-08 ENCOUNTER — Other Ambulatory Visit: Payer: Self-pay | Admitting: Psychiatry

## 2023-09-08 DIAGNOSIS — F3176 Bipolar disorder, in full remission, most recent episode depressed: Secondary | ICD-10-CM

## 2023-09-19 DIAGNOSIS — M5416 Radiculopathy, lumbar region: Secondary | ICD-10-CM | POA: Diagnosis not present

## 2023-09-19 DIAGNOSIS — R2681 Unsteadiness on feet: Secondary | ICD-10-CM | POA: Diagnosis not present

## 2023-09-19 DIAGNOSIS — M48062 Spinal stenosis, lumbar region with neurogenic claudication: Secondary | ICD-10-CM | POA: Diagnosis not present

## 2023-09-19 DIAGNOSIS — R2689 Other abnormalities of gait and mobility: Secondary | ICD-10-CM | POA: Diagnosis not present

## 2023-09-29 DIAGNOSIS — R2689 Other abnormalities of gait and mobility: Secondary | ICD-10-CM | POA: Diagnosis not present

## 2023-09-29 DIAGNOSIS — R2681 Unsteadiness on feet: Secondary | ICD-10-CM | POA: Diagnosis not present

## 2023-09-29 DIAGNOSIS — M5416 Radiculopathy, lumbar region: Secondary | ICD-10-CM | POA: Diagnosis not present

## 2023-09-29 DIAGNOSIS — M48062 Spinal stenosis, lumbar region with neurogenic claudication: Secondary | ICD-10-CM | POA: Diagnosis not present

## 2023-10-05 DIAGNOSIS — M5416 Radiculopathy, lumbar region: Secondary | ICD-10-CM | POA: Diagnosis not present

## 2023-10-05 DIAGNOSIS — M48062 Spinal stenosis, lumbar region with neurogenic claudication: Secondary | ICD-10-CM | POA: Diagnosis not present

## 2023-10-05 DIAGNOSIS — R2689 Other abnormalities of gait and mobility: Secondary | ICD-10-CM | POA: Diagnosis not present

## 2023-10-05 DIAGNOSIS — R2681 Unsteadiness on feet: Secondary | ICD-10-CM | POA: Diagnosis not present

## 2023-10-09 ENCOUNTER — Other Ambulatory Visit: Payer: Self-pay

## 2023-10-09 ENCOUNTER — Ambulatory Visit (INDEPENDENT_AMBULATORY_CARE_PROVIDER_SITE_OTHER): Admitting: Psychiatry

## 2023-10-09 ENCOUNTER — Encounter: Payer: Self-pay | Admitting: Psychiatry

## 2023-10-09 VITALS — BP 109/69 | HR 97 | Temp 96.9°F | Ht 61.0 in | Wt 237.2 lb

## 2023-10-09 DIAGNOSIS — F1021 Alcohol dependence, in remission: Secondary | ICD-10-CM | POA: Diagnosis not present

## 2023-10-09 DIAGNOSIS — F4310 Post-traumatic stress disorder, unspecified: Secondary | ICD-10-CM | POA: Diagnosis not present

## 2023-10-09 DIAGNOSIS — F3176 Bipolar disorder, in full remission, most recent episode depressed: Secondary | ICD-10-CM | POA: Diagnosis not present

## 2023-10-09 DIAGNOSIS — F17200 Nicotine dependence, unspecified, uncomplicated: Secondary | ICD-10-CM

## 2023-10-09 DIAGNOSIS — F1421 Cocaine dependence, in remission: Secondary | ICD-10-CM

## 2023-10-09 MED ORDER — TRAZODONE HCL 50 MG PO TABS: 50.0000 mg | ORAL_TABLET | Freq: Every evening | ORAL | 2 refills | Status: DC | PRN

## 2023-10-09 NOTE — Patient Instructions (Signed)
 Please get Lipid panel, TSH and prolactin

## 2023-10-09 NOTE — Progress Notes (Signed)
 [facility] MD OP Progress Note  10/09/2023 11:27 AM Susan [name]  MRN:  [id number]  Chief Complaint:  Chief Complaint  Patient presents with   Follow-up   Anxiety   Depression   Medication Refill   Discussed the use of AI scribe software for clinical note transcription with the patient, who gave verbal consent to proceed.  History of Present Illness Susan [name] is a 63 year old Caucasian female, currently unemployed, widowed, lives currently in [place] with her brother, has a history of bipolar disorder, PTSD, alcohol and cocaine use disorder in remission, tobacco use disorder, bereavement, degenerative disk disease, obstructive sleep apnea on CPAP, history of total hip replacement, osteoarthritis, hepatitis C was evaluated in office today for a follow-up.  She is managing her bipolar disorder and PTSD with Cymbalta  50 mg, Latuda  20 mg, and trazodone  50 mg as needed for sleep. Her bipolar disorder is in remission, and she is not experiencing any recent suicidal ideation . She feels that her current medication regimen is effective, although she acknowledges having both good and bad days.  She has chronic back pain, described as a dull, aching pain rated as 6 out of 10. The pain is constant and has persisted for at least ten years. She takes Vicodin for pain management, especially before long drives, and has previously used Celebrex  and Flexeril  as needed.   Her past medical history includes a total hip replacement and back surgery in June, with complications of a blood clot. She has completed physical therapy and continues to do exercises at the gym. She also has a history of stroke. She is not currently attending AA meetings and reports no current alcohol or drug use.  She does report sleep problems on and off mostly complicated by her pain.  She takes the trazodone  as needed at that generally helps.  She denies side effects.  Her weight is 237 pounds, increase compared to last in person  visit which was a year ago. She attributes some weight gain to her brother's insistence on eating three meals a day. She is currently living with her brother but plans to move into an apartment for the elderly, which is based on income. She has sold her house and is waiting for the apartment to become available.  She denies any suicidality, homicidality or perceptual disturbances.  She appeared to be alert, oriented to person place time and situation.  She denies any other concerns today.    Visit Diagnosis:    ICD-10-CM   1. Bipolar disorder, in full remission, most recent episode depressed (HCC)  F31.76    Type I    2. PTSD (post-traumatic stress disorder)  F43.10 traZODone  (DESYREL ) 50 MG tablet    3. Tobacco use disorder  F17.200    In early remission    4. Alcohol use disorder, severe, in sustained remission (HCC)  F10.21     5. Cocaine use disorder, moderate, in sustained remission (HCC)  F14.21       Past Psychiatric History: I have reviewed past psychiatric history from progress note on 09/27/2019.  Past trials of Seroquel, Cymbalta , Abilify, risperidone .  Past Medical History:  Past Medical History:  Diagnosis Date   ADD (attention deficit disorder)    Alcohol abuse    Anemia    Anxiety    Aortic atherosclerosis (HCC) 02/20/2018   Chest CT Sept 2019   Arthritis    rheumatoid arthritis   Asthma    Bipolar disorder (HCC)  Centrilobular emphysema (HCC)    Cocaine use disorder, moderate, in sustained remission (HCC)    Constipation    Degenerative disc disease at L5-S1 level 09/28/2016   See ortho note May 2018   Depression    bipolar, hx of suicide attempt   Diabetes mellitus, type 2 (HCC)    Diverticulitis 2013   DOE (dyspnea on exertion)    Drug use    Family history of adverse reaction to anesthesia    mom-delayed emergence   GERD (gastroesophageal reflux disease)    H/O suicide attempt    slit wrists   Hepatitis C 06/26/2014   treated with Harvoni    Hip pain    History of echocardiogram    a. 04/2019 Echo: EF >65%, nl RV fxn.   History of MRSA infection 2013   HLD (hyperlipidemia)    Hypertension    a. 06/2021 Renal Duplex: ? bilat RAS; b. 06/2021 CTA Abd: No signif RAS.   Hyponatremia    Hypothyroidism    Incisional hernia 11/08/2012   Knee pain    Left carotid bruit    Morbid obesity (HCC)    Multinodular thyroid     OSA (obstructive sleep apnea)    OSA on CPAP    Osteoporosis    Palpitations    Post-traumatic osteoarthritis of right knee 09/24/2015   Recurrent ventral hernia 11/08/2012   Status post total right knee replacement using cement 05/10/2016   Stroke (HCC) 07/05/2022   right arm,torso and right leg numbness   Tobacco use disorder    Vitamin B12 deficiency    Vitamin D  deficiency disease     Past Surgical History:  Procedure Laterality Date   BILATERAL SALPINGOOPHORECTOMY     due to abnormal mass   BREAST BIOPSY Left    neg   BREAST SURGERY Left 20 yrs ago   CESAREAN SECTION     COLONOSCOPY     COLONOSCOPY WITH PROPOFOL  N/A 10/16/2017   Procedure: COLONOSCOPY WITH PROPOFOL ;  Surgeon: Luke Salaam, MD;  Location: [facility] ENDOSCOPY;  Service: Gastroenterology;  Laterality: N/A;   COLONOSCOPY WITH PROPOFOL  N/A 02/16/2021   Procedure: COLONOSCOPY WITH PROPOFOL ;  Surgeon: Luke Salaam, MD;  Location: [facility] ENDOSCOPY;  Service: Gastroenterology;  Laterality: N/A;   FORAMINOTOMY 1 LEVEL Right 10/10/2022   Procedure: RIGHT L4-5 LAMINOFORAMINOTOMY;  Surgeon: Jodeen Munch, MD;  Location: [facility] ORS;  Service: Neurosurgery;  Laterality: Right;   HERNIA REPAIR  06/2011, July 2014   Ventral wall repair with Physiomesh   HERNIA REPAIR     2nd.vental wall repair   JOINT REPLACEMENT Right    knee   LUMBAR LAMINECTOMY/ DECOMPRESSION WITH MET-RX N/A 10/12/2022   Procedure: LUMBAR LAMINECTOMY/ DECOMPRESSION WITH MET-RX;  Surgeon: Jodeen Munch, MD;  Location: [facility] ORS;  Service: Neurosurgery;  Laterality: N/A;    TONSILLECTOMY     TOTAL HIP ARTHROPLASTY Left 07/23/2019   Procedure: TOTAL HIP ARTHROPLASTY;  Surgeon: Elner Hahn, MD;  Location: [facility] ORS;  Service: Orthopedics;  Laterality: Left;   TOTAL KNEE ARTHROPLASTY Right 05/10/2016   Procedure: TOTAL KNEE ARTHROPLASTY;  Surgeon: Elner Hahn, MD;  Location: [facility] ORS;  Service: Orthopedics;  Laterality: Right;   TUBAL LIGATION      Family Psychiatric History: I have reviewed family psychiatric history from progress note on 09/27/2019.  Family History:  Family History  Problem Relation Age of Onset   Arthritis Mother    Asthma Mother    Mental illness Mother    Thyroid  disease Mother  COPD Mother    Heart disease Mother    Congestive Heart Failure Mother    Alcohol abuse Mother    Eating disorder Mother    Bipolar disorder Mother    Alcohol abuse Father    Heart attack Father    Alcohol abuse Sister    Drug abuse Sister    Mental illness Sister    Mental illness Sister    Fibromyalgia Sister    Obesity Sister    Pneumonia Sister    Depression Sister    Mental illness Sister    Alcohol abuse Sister    Drug abuse Sister    Arthritis Brother    Mental illness Brother    Cancer Brother        non-hodkins lymphoma   Drug abuse Daughter    Drug abuse Son    Alcohol abuse Son    Drug abuse Son    Alcohol abuse Son    Diabetes Neg Hx    Stroke Neg Hx    Breast cancer Neg Hx    Thyroid  cancer Neg Hx     Social History: I have reviewed social history from progress note on 09/27/2019. Social History   Socioeconomic History   Marital status: Widowed    Spouse name: Not on file   Number of children: 3   Years of education: Not on file   Highest education level: Some college, no degree  Occupational History   Occupation: disabled  Tobacco Use   Smoking status: Former    Current packs/day: 0.50    Average packs/day: 0.5 packs/day for 41.0 years (20.5 ttl pk-yrs)    Types: Cigarettes   Smokeless tobacco: Never  Vaping Use    Vaping status: Every Day   Substances: Nicotine   Substance and Sexual Activity   Alcohol use: No    Alcohol/week: 0.0 standard drinks of 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   Drug use: No    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   Sexual activity: Not Currently  Other Topics Concern   Not on file  Social History Narrative   Not on file   Social Drivers of Health   Financial Resource Strain: Low Risk  (03/29/2023)   Overall Financial Resource Strain (CARDIA)    Difficulty of Paying Living Expenses: Not hard at all  Food Insecurity: No Food Insecurity (03/29/2023)   Hunger Vital Sign    Worried About Running Out of Food in the Last Year: Never true    Ran Out of Food in the Last Year: Never true  Transportation Needs: No Transportation Needs (03/29/2023)   PRAPARE - Administrator, Civil Service (Medical): No    Lack of Transportation (Non-Medical): No  Physical Activity: Inactive (03/29/2023)   Exercise Vital Sign    Days of Exercise per Week: 0 days    Minutes of Exercise per Session: 0 min  Stress: No Stress Concern Present (03/29/2023)   [employer] of Occupational Health - Occupational Stress Questionnaire    Feeling of Stress : Only a little  Social Connections: Moderately Isolated (03/29/2023)   Social Connection and Isolation Panel [NHANES]    Frequency of Communication with Friends and Family: Twice a week    Frequency of Social Gatherings with Friends and Family: More than three times a week    Attends Religious Services: Never    Database administrator or Organizations: Yes    Attends Banker Meetings: More  than 4 times per year    Marital Status: Widowed    Allergies:  Allergies  Allergen Reactions   Wellbutrin  [Bupropion ]     Patient reports made " deathly sick " years ago at appointment 03/18/20.   Furosemide Other (See Comments)    Electrolyte imbalance  Other Reaction(s): Other (See  Comments)  Electrolyte imbalance Electrolyte imbalance Electrolyte imbalance    Electrolyte imbalance   Hydrochlorothiazide  Other (See Comments)    Electrolyte imbalance  Other Reaction(s): Dizziness, Other (See Comments)    Metabolic Disorder Labs: Lab Results  Component Value Date   HGBA1C 5.4 03/28/2023   MPG 108 04/08/2019   MPG 103 08/16/2017   No results found for: "PROLACTIN" Lab Results  Component Value Date   CHOL 211 (H) 07/06/2022   TRIG 166 (H) 07/06/2022   HDL 50 07/06/2022   CHOLHDL 4.2 07/06/2022   VLDL 33 07/06/2022   LDLCALC 128 (H) 07/06/2022   LDLCALC 108 (H) 06/28/2022   Lab Results  Component Value Date   TSH 1.06 03/28/2023   TSH 1.15 04/28/2021    Therapeutic Level Labs: No results found for: "LITHIUM" No results found for: "VALPROATE" No results found for: "CBMZ"  Current Medications: Current Outpatient Medications  Medication Sig Dispense Refill   acetaminophen  (TYLENOL ) 500 MG tablet Take 1,000 mg by mouth 2 (two) times daily.     albuterol  (VENTOLIN  HFA) 108 (90 Base) MCG/ACT inhaler Inhale 2 puffs into the lungs every 6 (six) hours as needed for wheezing or shortness of breath. 8 g 2   aspirin  EC 81 MG tablet Take by mouth.     carvedilol  (COREG ) 12.5 MG tablet Take 12.5 mg by mouth 2 (two) times daily.     clotrimazole  (LOTRIMIN ) 1 % cream Apply 1 application  topically 2 (two) times daily as needed.     cyclobenzaprine  (FLEXERIL ) 5 MG tablet Take 5 mg by mouth 2 (two) times daily.     docusate sodium  (COLACE) 100 MG capsule Take 1 capsule (100 mg total) by mouth daily. Take with breakfast 30 capsule 5   DULoxetine  (CYMBALTA ) 20 MG capsule TAKE ONE CAPSULE BY MOUTH DAILY *TAKE WITH 30 MG CAPSULE 30 capsule 10   DULoxetine  (CYMBALTA ) 30 MG capsule TAKE 1 CAPSULE BY MOUTH DAILY *TAKE WITH 20MG  CAPSULE 30 capsule 11   HYDROcodone -acetaminophen  (NORCO/VICODIN) 5-325 MG tablet Take 1 tablet by mouth daily as needed for moderate pain.      lurasidone  (LATUDA ) 20 MG TABS tablet TAKE 1 TABLET(20 MG) BY MOUTH DAILY WITH SUPPER 30 tablet 3   naloxone (NARCAN) nasal spray 4 mg/0.1 mL Place into the nose.     OZEMPIC , 2 MG/DOSE, 8 MG/3ML SOPN INJECT 2 MG SUBCUTANEOUSLY ONCE WEEKLY AS DIRECTED 3 mL 10   pregabalin  (LYRICA ) 150 MG capsule Take 1 capsule (150 mg total) by mouth 2 (two) times daily. 60 capsule 6   rosuvastatin  (CRESTOR ) 20 MG tablet Take 1 tablet (20 mg total) by mouth daily. 90 tablet 3   spironolactone  (ALDACTONE ) 25 MG tablet TAKE 1 TABLET BY MOUTH DAILY *EMERGENCY REFILL* 90 tablet 0   Vibegron  (GEMTESA ) 75 MG TABS Take 1 tablet (75 mg total) by mouth daily. 30 tablet 5   traZODone  (DESYREL ) 50 MG tablet Take 1 tablet (50 mg total) by mouth at bedtime as needed for sleep. 30 tablet 2   No current facility-administered medications for this visit.     Musculoskeletal: Strength & Muscle Tone: within normal limits Gait & Station: walks  with a walker Patient leans: N/A  Psychiatric Specialty Exam: Review of Systems  Psychiatric/Behavioral: Negative.      Blood pressure 109/69, pulse 97, temperature (!) 96.9 F (36.1 C), temperature source Temporal, height 5\' 1"  (1.549 m), weight 237 lb 3.2 oz (107.6 kg).Body mass index is 44.82 kg/m.  General Appearance: Casual  Eye Contact:  Fair  Speech:  Clear and Coherent  Volume:  Normal  Mood:  Euthymic  Affect:  Congruent  Thought Process:  Goal Directed and Descriptions of Associations: Intact  Orientation:  Full (Time, Place, and Person)  Thought Content: Logical   Suicidal Thoughts:  No  Homicidal Thoughts:  No  Memory:  Immediate;   Fair Recent;   Fair Remote;   Fair  Judgement:  Fair  Insight:  Fair  Psychomotor Activity:  Normal  Concentration:  Concentration: Fair and Attention Span: Fair  Recall:  [employer] of Knowledge: Fair  Language: Fair  Akathisia:  No  Handed:  Right  AIMS (if indicated): done  Assets:  Communication Skills Desire for  Improvement Social Support Talents/Skills Transportation  ADL's:  Intact  Cognition: WNL  Sleep:  Fair   Screenings: Midwife Visit from 10/09/2023 in [facility] Video Visit from 03/24/2022 in [facility] Video Visit from 01/20/2022 in [facility] Video Visit from 10/21/2021 in [facility]  AIMS Total Score 0 0 0 0      GAD-7    Flowsheet Row Office Visit from 10/09/2023 in [facility] Office Visit from 03/28/2023 in [facility] [employer] Visit from 12/02/2022 in [facility] [employer] Visit from 11/21/2022 in [facility] [employer] Visit from 09/28/2022 in [facility] [employer]  Total GAD-7 Score 5 5 6 2 4       PHQ2-9    Flowsheet Row Office Visit from 10/09/2023 in [facility] Clinical Support from 03/29/2023 in [facility] Visit from 03/28/2023 in [facility] Visit from 02/13/2023 in [facility] Office Visit from 12/28/2022 in [facility]  PHQ-2 Total Score 1 1 1 2 2   PHQ-9 Total Score -- 5 6 4 4       Flowsheet Row Office Visit from 10/09/2023 in [facility] Video Visit from 07/28/2023 in [facility] Video Visit from 04/28/2023 in [facility]  C-SSRS RISK CATEGORY Moderate Risk Moderate Risk Moderate Risk        Assessment and Plan: ALEXANDER MCAULEY is a 63 year old Caucasian female with history of  bipolar disorder, PTSD, alcoholism and cocaine use disorder in remission was evaluated in office today.  Bipolar disorder in remission Currently mood symptoms are stable on the current medication regimen.  Does have short-term memory problems although overall she has been managing okay. Continue Latuda  20 mg daily with supper  Posttraumatic stress disorder-stable Currently denies any significant trauma related symptoms. Continue Cymbalta  50 mg daily Continue Trazodone  50 mg at bedtime as needed  Tobacco use disorder in remission/alcohol and cocaine use disorder in remission She continues to stay away from drugs, alcohol and smoking. Continue AA meetings as needed  Will benefit from repeat labs including lipid panel, TSH, prolactin.  Patient agrees to get this completed at her physical exam which is coming up with primary care provider in June.  If not we will consider repeating these labs when she returns for follow-up in 3 months.  Follow-up in clinic in 3 months or sooner if needed.  Collaboration of Care: Collaboration of Care: Primary Care Provider AEB encouraged to continue follow-up with primary care provider.  Patient/Guardian was advised Release of Information must be obtained prior to any record release in order to collaborate their care with an outside provider. Patient/Guardian was advised if they have not already done so to contact the registration department to sign all necessary forms in order for us  to release information regarding their care.   Consent: Patient/Guardian gives verbal consent for treatment and assignment of benefits for services provided during this visit. Patient/Guardian expressed understanding and agreed to proceed.   This note was generated in part or whole with voice recognition software. Voice recognition is usually quite accurate but there are transcription errors that can and very often do occur. I apologize for any typographical errors that were not  detected and corrected.    Jenna Routzahn, MD 10/09/2023, 2:34 PM

## 2023-10-11 DIAGNOSIS — M48062 Spinal stenosis, lumbar region with neurogenic claudication: Secondary | ICD-10-CM | POA: Diagnosis not present

## 2023-10-11 DIAGNOSIS — M5416 Radiculopathy, lumbar region: Secondary | ICD-10-CM | POA: Diagnosis not present

## 2023-10-11 DIAGNOSIS — R2681 Unsteadiness on feet: Secondary | ICD-10-CM | POA: Diagnosis not present

## 2023-10-11 DIAGNOSIS — R2689 Other abnormalities of gait and mobility: Secondary | ICD-10-CM | POA: Diagnosis not present

## 2023-10-24 DIAGNOSIS — R2689 Other abnormalities of gait and mobility: Secondary | ICD-10-CM | POA: Diagnosis not present

## 2023-10-24 DIAGNOSIS — M48062 Spinal stenosis, lumbar region with neurogenic claudication: Secondary | ICD-10-CM | POA: Diagnosis not present

## 2023-10-24 DIAGNOSIS — R2681 Unsteadiness on feet: Secondary | ICD-10-CM | POA: Diagnosis not present

## 2023-10-24 DIAGNOSIS — M5416 Radiculopathy, lumbar region: Secondary | ICD-10-CM | POA: Diagnosis not present

## 2023-10-26 ENCOUNTER — Other Ambulatory Visit: Payer: Self-pay

## 2023-10-26 NOTE — Telephone Encounter (Unsigned)
 Copied from CRM [id number]. Topic: Clinical - Medication Refill >> Oct 26, 2023  4:23 PM Howard Macho wrote: Medication: rosuvastatin  (CRESTOR ) 20 MG tablet  Has the patient contacted their pharmacy? No (Agent: If no, request that the patient contact the pharmacy for the refill. If patient does not wish to contact the pharmacy document the reason why and proceed with request.) (Agent: If yes, when and what did the pharmacy advise?)  This is the patient's preferred pharmacy:  [facility], [place] - [address] [place] [address] Phone: [phone] Fax: [phone]  Is this the correct pharmacy for this prescription? Yes If no, delete pharmacy and type the correct one.   Has the prescription been filled recently? Yes  Is the patient out of the medication? Yes  Has the patient been seen for an appointment in the last year OR does the patient have an upcoming appointment? {yes/no:[id number]}  Can we respond through MyChart? Yes  Agent: Please be advised that Rx refills may take up to 3 business days. We ask that you follow-up with your pharmacy.

## 2023-11-01 NOTE — Telephone Encounter (Signed)
 She has not had a cholesterol or liver panel check in one year. Please see if she has enough medication to get her through to her establish care appt or see if earlier appt.

## 2023-11-01 NOTE — Telephone Encounter (Signed)
 Called pt and she stated that she has enough to get her to the [facility]

## 2023-11-03 ENCOUNTER — Ambulatory Visit: Admitting: Nurse Practitioner

## 2023-11-03 VITALS — BP 112/68 | HR 74 | Ht 61.0 in | Wt 238.8 lb

## 2023-11-03 DIAGNOSIS — R300 Dysuria: Secondary | ICD-10-CM | POA: Diagnosis not present

## 2023-11-03 DIAGNOSIS — R350 Frequency of micturition: Secondary | ICD-10-CM | POA: Diagnosis not present

## 2023-11-03 LAB — POC URINALSYSI DIPSTICK (AUTOMATED)
Bilirubin, UA: NEGATIVE
Glucose, UA: NEGATIVE
Ketones, UA: NEGATIVE
Nitrite, UA: NEGATIVE
Protein, UA: POSITIVE — AB
Spec Grav, UA: 1.01 (ref 1.010–1.025)
Urobilinogen, UA: 1 U/dL
pH, UA: 6 (ref 5.0–8.0)

## 2023-11-03 MED ORDER — SULFAMETHOXAZOLE-TRIMETHOPRIM 800-160 MG PO TABS: 1.0000 | ORAL_TABLET | Freq: Two times a day (BID) | ORAL | 0 refills | Status: DC

## 2023-11-03 NOTE — Patient Instructions (Addendum)
 You have symptoms of urgency, frequency, and burning with a feeling of fullness after urination. Your urinalysis indicates an infection. -We have started you on an empirical antibiotic treatment while we wait for your urine culture results. Depending on the results, we may adjust your antibiotics. -Take probiotics with antibiotic.

## 2023-11-03 NOTE — Assessment & Plan Note (Addendum)
 Symptoms of urgency, frequency, and dysuria with post-void fullness and burning. Dip stick positive for leukocytes and lysed blood. - Will treat empirical with Bactrim . - Await urine culture results for potential antibiotic adjustment.

## 2023-11-03 NOTE — Progress Notes (Signed)
 Established Patient Office Visit  Subjective:  Patient ID: Susan [name], female    DOB: 03/20/1961  Age: 63 y.o. MRN: [id number]  CC:  Chief Complaint  Patient presents with   Acute Visit    Urine frequency & burning   Discussed the use of a AI scribe software for clinical note transcription with the patient, who gave verbal consent to proceed.  HPI  Susan [name] is a 63 year old female with overactive bladder who presents with urinary urgency and burning sensation.  She experiences intermittent urinary urgency and a burning sensation upon urination since December. Symptoms temporarily improve with antibiotics but recur after completion. She has been treated twice with antibiotics for UTI, most recently at an urgent care in [place].  She feels a burning sensation after urination and sometimes experiences incomplete bladder emptying, requiring her to lean forward to void completely.   HPI   Past Medical History:  Diagnosis Date   ADD (attention deficit disorder)    Alcohol abuse    Anemia    Anxiety    Aortic atherosclerosis (HCC) 02/20/2018   Chest CT Sept 2019   Arthritis    rheumatoid arthritis   Asthma    Bipolar disorder (HCC)    Centrilobular emphysema (HCC)    Cocaine use disorder, moderate, in sustained remission (HCC)    Constipation    Degenerative disc disease at L5-S1 level 09/28/2016   See ortho note May 2018   Depression    bipolar, hx of suicide attempt   Diabetes mellitus, type 2 (HCC)    Diverticulitis 2013   DOE (dyspnea on exertion)    Drug use    Family history of adverse reaction to anesthesia    mom-delayed emergence   GERD (gastroesophageal reflux disease)    H/O suicide attempt    slit wrists   Hepatitis C 06/26/2014   treated with Harvoni   Hip pain    History of echocardiogram    a. 04/2019 Echo: EF >65%, nl RV fxn.   History of MRSA infection 2013   HLD (hyperlipidemia)    Hypertension    a. 06/2021 Renal Duplex: ? bilat  RAS; b. 06/2021 CTA Abd: No signif RAS.   Hyponatremia    Hypothyroidism    Incisional hernia 11/08/2012   Knee pain    Left carotid bruit    Morbid obesity (HCC)    Multinodular thyroid     OSA (obstructive sleep apnea)    OSA on CPAP    Osteoporosis    Palpitations    Post-traumatic osteoarthritis of right knee 09/24/2015   Recurrent ventral hernia 11/08/2012   Status post total right knee replacement using cement 05/10/2016   Stroke (HCC) 07/05/2022   right arm,torso and right leg numbness   Tobacco use disorder    Vitamin B12 deficiency    Vitamin D  deficiency disease     Past Surgical History:  Procedure Laterality Date   BILATERAL SALPINGOOPHORECTOMY     due to abnormal mass   BREAST BIOPSY Left    neg   BREAST SURGERY Left 20 yrs ago   CESAREAN SECTION     COLONOSCOPY     COLONOSCOPY WITH PROPOFOL  N/A 10/16/2017   Procedure: COLONOSCOPY WITH PROPOFOL ;  Surgeon: Therisa Bi, MD;  Location[facility] ENDOSCOPY;  Service: Gastroenterology;  Laterality: N/A;   COLONOSCOPY WITH PROPOFOL  N/A 02/16/2021   Procedure: COLONOSCOPY WITH PROPOFOL ;  Surgeon: Therisa Bi, MD;  Location: [facility] ENDOSCOPY;  Service: Gastroenterology;  Laterality: N/A;  FORAMINOTOMY 1 LEVEL Right 10/10/2022   Procedure: RIGHT L4-5 LAMINOFORAMINOTOMY;  Surgeon: Clois Fret, MD;  Location: [facility] ORS;  Service: Neurosurgery;  Laterality: Right;   HERNIA REPAIR  06/2011, July 2014   Ventral wall repair with Physiomesh   HERNIA REPAIR     2nd.vental wall repair   JOINT REPLACEMENT Right    knee   LUMBAR LAMINECTOMY/ DECOMPRESSION WITH MET-RX N/A 10/12/2022   Procedure: LUMBAR LAMINECTOMY/ DECOMPRESSION WITH MET-RX;  Surgeon: Clois Fret, MD;  Location: [facility] ORS;  Service: Neurosurgery;  Laterality: N/A;   TONSILLECTOMY     TOTAL HIP ARTHROPLASTY Left 07/23/2019   Procedure: TOTAL HIP ARTHROPLASTY;  Surgeon: Edie Norleen PARAS, MD;  Location: [facility] ORS;  Service: Orthopedics;  Laterality: Left;   TOTAL KNEE  ARTHROPLASTY Right 05/10/2016   Procedure: TOTAL KNEE ARTHROPLASTY;  Surgeon: Norleen PARAS Edie, MD;  Location: [facility] ORS;  Service: Orthopedics;  Laterality: Right;   TUBAL LIGATION      Family History  Problem Relation Age of Onset   Arthritis Mother    Asthma Mother    Mental illness Mother    Thyroid  disease Mother    COPD Mother    Heart disease Mother    Congestive Heart Failure Mother    Alcohol abuse Mother    Eating disorder Mother    Bipolar disorder Mother    Alcohol abuse Father    Heart attack Father    Alcohol abuse Sister    Drug abuse Sister    Mental illness Sister    Mental illness Sister    Fibromyalgia Sister    Obesity Sister    Pneumonia Sister    Depression Sister    Mental illness Sister    Alcohol abuse Sister    Drug abuse Sister    Arthritis Brother    Mental illness Brother    Cancer Brother        non-hodkins lymphoma   Drug abuse Daughter    Drug abuse Son    Alcohol abuse Son    Drug abuse Son    Alcohol abuse Son    Diabetes Neg Hx    Stroke Neg Hx    Breast cancer Neg Hx    Thyroid  cancer Neg Hx     Social History   Socioeconomic History   Marital status: Widowed    Spouse name: Not on file   Number of children: 3   Years of education: Not on file   Highest education level: Some college, no degree  Occupational History   Occupation: disabled  Tobacco Use   Smoking status: Former    Current packs/day: 0.50    Average packs/day: 0.5 packs/day for 41.0 years (20.5 ttl pk-yrs)    Types: Cigarettes   Smokeless tobacco: Never  Vaping Use   Vaping status: Every Day   Substances: Nicotine   Substance and Sexual Activity   Alcohol use: No    Alcohol/week: 0.0 standard drinks of 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   Drug use: No    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   Sexual activity: Not Currently  Other Topics Concern   Not on file  Social History Narrative   Not on file   Social Drivers of Health    Financial Resource Strain: Low Risk  (03/29/2023)   Overall Financial Resource Strain (CARDIA)    Difficulty of Paying Living Expenses: Not hard at all  Food Insecurity: No Food Insecurity (03/29/2023)   Hunger  Vital Sign    Worried About Programme researcher, broadcasting/film/video in the Last Year: Never true    Ran Out of Food in the Last Year: Never true  Transportation Needs: No Transportation Needs (03/29/2023)   PRAPARE - Administrator, Civil Service (Medical): No    Lack of Transportation (Non-Medical): No  Physical Activity: Inactive (03/29/2023)   Exercise Vital Sign    Days of Exercise per Week: 0 days    Minutes of Exercise per Session: 0 min  Stress: No Stress Concern Present (03/29/2023)   [employer] of Occupational Health - Occupational Stress Questionnaire    Feeling of Stress : Only a little  Social Connections: Moderately Isolated (03/29/2023)   Social Connection and Isolation Panel    Frequency of Communication with Friends and Family: Twice a week    Frequency of Social Gatherings with Friends and Family: More than three times a week    Attends Religious Services: Never    Database administrator or Organizations: Yes    Attends Engineer, structural: More than 4 times per year    Marital Status: Widowed  Intimate Partner Violence: Unknown (09/19/2023)   Received from [facility]   HITS    Over the last 12 months how often did your partner physically hurt you?: Never    Over the last 12 months how often did your partner insult you or talk down to you?: Never    Threaten Physical Harm: Not on file    Scream or Curse: Not on file     Outpatient Medications Prior to Visit  Medication Sig Dispense Refill   acetaminophen  (TYLENOL ) 500 MG tablet Take 1,000 mg by mouth 2 (two) times daily.     albuterol  (VENTOLIN  HFA) 108 (90 Base) MCG/ACT inhaler Inhale 2 puffs into the lungs every 6 (six) hours as needed for wheezing or shortness of breath. 8 g 2   aspirin   EC 81 MG tablet Take by mouth.     carvedilol  (COREG ) 12.5 MG tablet Take 12.5 mg by mouth 2 (two) times daily.     clotrimazole  (LOTRIMIN ) 1 % cream Apply 1 application  topically 2 (two) times daily as needed.     cyclobenzaprine  (FLEXERIL ) 5 MG tablet Take 5 mg by mouth 2 (two) times daily.     docusate sodium  (COLACE) 100 MG capsule Take 1 capsule (100 mg total) by mouth daily. Take with breakfast 30 capsule 5   DULoxetine  (CYMBALTA ) 20 MG capsule TAKE ONE CAPSULE BY MOUTH DAILY *TAKE WITH 30 MG CAPSULE 30 capsule 10   DULoxetine  (CYMBALTA ) 30 MG capsule TAKE 1 CAPSULE BY MOUTH DAILY *TAKE WITH 20MG  CAPSULE 30 capsule 11   HYDROcodone -acetaminophen  (NORCO/VICODIN) 5-325 MG tablet Take 1 tablet by mouth daily as needed for moderate pain.     lurasidone  (LATUDA ) 20 MG TABS tablet TAKE 1 TABLET(20 MG) BY MOUTH DAILY WITH SUPPER 30 tablet 3   naloxone (NARCAN) nasal spray 4 mg/0.1 mL Place into the nose.     OZEMPIC , 2 MG/DOSE, 8 MG/3ML SOPN INJECT 2 MG SUBCUTANEOUSLY ONCE WEEKLY AS DIRECTED 3 mL 10   pregabalin  (LYRICA ) 150 MG capsule Take 1 capsule (150 mg total) by mouth 2 (two) times daily. 60 capsule 6   rosuvastatin  (CRESTOR ) 20 MG tablet Take 1 tablet (20 mg total) by mouth daily. 90 tablet 3   traZODone  (DESYREL ) 50 MG tablet Take 1 tablet (50 mg total) by mouth at bedtime as needed for sleep. 30  tablet 2   Vibegron  (GEMTESA ) 75 MG TABS Take 1 tablet (75 mg total) by mouth daily. 30 tablet 5   spironolactone  (ALDACTONE ) 25 MG tablet TAKE 1 TABLET BY MOUTH DAILY *EMERGENCY REFILL* 90 tablet 0   No facility-administered medications prior to visit.    Allergies  Allergen Reactions   Wellbutrin  [Bupropion ]     Patient reports made  deathly sick  years ago at appointment 03/18/20.   Furosemide Other (See Comments)    Electrolyte imbalance  Other Reaction(s): Other (See Comments)  Electrolyte imbalance Electrolyte imbalance Electrolyte imbalance    Electrolyte imbalance    Hydrochlorothiazide  Other (See Comments)    Electrolyte imbalance  Other Reaction(s): Dizziness, Other (See Comments)    ROS Review of Systems Negative unless indicated in HPI.    Objective:    Physical Exam Constitutional:      Appearance: Normal appearance.  Cardiovascular:     Rate and Rhythm: Normal rate and regular rhythm.     Pulses: Normal pulses.     Heart sounds: Normal heart sounds.  Abdominal:     General: Bowel sounds are normal.     Palpations: Abdomen is soft.     Tenderness: There is no abdominal tenderness. There is no right CVA tenderness or left CVA tenderness.  Musculoskeletal:     Cervical back: Normal range of motion.  Neurological:     General: No focal deficit present.     Mental Status: She is alert. Mental status is at baseline.  Psychiatric:        Mood and Affect: Mood normal.        Behavior: Behavior normal.        Thought Content: Thought content normal.        Judgment: Judgment normal.     BP 112/68   Pulse 74   Ht 5' 1 (1.549 m)   Wt 238 lb 12.8 oz (108.3 kg)   SpO2 98%   BMI 45.12 kg/m  Wt Readings from Last 3 Encounters:  11/03/23 238 lb 12.8 oz (108.3 kg)  03/30/23 230 lb (104.3 kg)  03/29/23 230 lb 4 oz (104.4 kg)     Health Maintenance  Topic Date Due   OPHTHALMOLOGY EXAM  Never done   Zoster Vaccines- Shingrix (1 of 2) Never done   Diabetic kidney evaluation - Urine ACR  10/15/2020   Colonoscopy  02/16/2022   DTaP/Tdap/Td (2 - Td or Tdap) 03/09/2022   Cervical Cancer Screening (Pap smear)  03/24/2022   MAMMOGRAM  06/09/2022   FOOT EXAM  06/29/2023   HEMOGLOBIN A1C  09/25/2023   Lung Cancer Screening  10/17/2023   COVID-19 Vaccine (5 - 2024-25 season) 11/18/2023 (Originally 01/08/2023)   INFLUENZA VACCINE  12/08/2023   Diabetic kidney evaluation - eGFR measurement  03/27/2024   Medicare Annual Wellness (AWV)  03/28/2024   Pneumococcal Vaccine 44-26 Years old  Completed   Hepatitis B Vaccines  Completed    Hepatitis C Screening  Completed   HIV Screening  Completed   HPV VACCINES  Aged Out   Meningococcal B Vaccine  Aged Out    There are no preventive care reminders to display for this patient.  Lab Results  Component Value Date   TSH 1.06 03/28/2023   Lab Results  Component Value Date   WBC 9.5 11/05/2022   HGB 11.3 (L) 11/05/2022   HCT 33.5 (L) 11/05/2022   MCV 95.4 11/05/2022   PLT 217 11/05/2022   Lab Results  Component Value  Date   NA 135 03/28/2023   K 4.5 03/28/2023   CO2 22 03/28/2023   GLUCOSE 79 03/28/2023   BUN 22 03/28/2023   CREATININE 1.48 (H) 03/28/2023   BILITOT 0.6 11/02/2022   ALKPHOS 86 11/02/2022   AST 14 11/02/2022   ALT 9 11/02/2022   PROT 7.2 11/02/2022   ALBUMIN 4.3 11/02/2022   CALCIUM  9.1 03/28/2023   ANIONGAP 7 11/05/2022   GFR 37.81 (L) 03/28/2023   Lab Results  Component Value Date   CHOL 211 (H) 07/06/2022   Lab Results  Component Value Date   HDL 50 07/06/2022   Lab Results  Component Value Date   LDLCALC 128 (H) 07/06/2022   Lab Results  Component Value Date   TRIG 166 (H) 07/06/2022   Lab Results  Component Value Date   CHOLHDL 4.2 07/06/2022   Lab Results  Component Value Date   HGBA1C 5.4 03/28/2023      Assessment & Plan:  Dysuria Assessment & Plan: Symptoms of urgency, frequency, and dysuria with post-void fullness and burning. Dip stick positive for leukocytes and lysed blood. - Will treat empirical with Bactrim . - Await urine culture results for potential antibiotic adjustment.    Orders: -     Urine Culture  Urine frequency -     POCT Urinalysis Dipstick (Automated) -     Urine Culture  Other orders -     Sulfamethoxazole -Trimethoprim ; Take 1 tablet by mouth 2 (two) times daily.  Dispense: 20 tablet; Refill: 0    Follow-up: No follow-ups on file.   Bentley Fissel, NP

## 2023-11-06 ENCOUNTER — Other Ambulatory Visit: Payer: Self-pay | Admitting: Internal Medicine

## 2023-11-06 DIAGNOSIS — I10 Essential (primary) hypertension: Secondary | ICD-10-CM

## 2023-11-16 ENCOUNTER — Telehealth: Payer: 59 | Admitting: Neurosurgery

## 2023-11-16 ENCOUNTER — Ambulatory Visit: Payer: Self-pay

## 2023-11-16 NOTE — Telephone Encounter (Signed)
 FYI Only or Action Required?: Action required by provider: request for appointment.  Patient was last seen in primary care on 11/03/2023 by Vincente Saber, NP.  Called Nurse Triage reporting Dizziness.  Symptoms began Middle of June.  Interventions attempted: Rest, hydration, or home remedies and Other: patient reports using her walker to keep her balance when the dizziness comes on.  Symptoms are: unchanged.  Triage Disposition: See Physician Within 24 Hours-no appointments available-patient states she will go to the ED to be evaluated.   Patient/caregiver understands and will follow disposition?: Yes  Copied from CRM [id number]. Topic: Appointments - Appointment Scheduling >> Nov 16, 2023 10:06 AM Sophia H wrote:  Patient states she has been feeling very light headed, believes her blood pressure medicine may need to be adjusted. No appointments available in clinic, patient states its only when she stands up that she starts to feel like she is going to pass out, if she stands still it will go away. Declined speaking with NT, stated she will go to urgent care. # [phone] Reason for Disposition  [1] MODERATE dizziness (e.g., interferes with normal activities) AND [2] has NOT been evaluated by doctor (or NP/PA) for this  (Exception: Dizziness caused by heat exposure, sudden standing, or poor fluid intake.)  Answer Assessment - Initial Assessment Questions 1. DESCRIPTION: Describe your dizziness.     Patient describes lightheadedness that occurs when she stands up 2. LIGHTHEADED: Do you feel lightheaded? (e.g., somewhat faint, woozy, weak upon standing)     yes 3. VERTIGO: Do you feel like either you or the room is spinning or tilting? (i.e., vertigo)     Patient reports she feels like the floor is tilting at times.  4. SEVERITY: How bad is it?  Do you feel like you are going to faint? Can you stand and walk?     In moderate and severe when dizziness occurs.  5. ONSET:   When did the dizziness begin?     Middle of June 6. AGGRAVATING FACTORS: Does anything make it worse? (e.g., standing, change in head position)     From sitting to standing 7. HEART RATE: Can you tell me your heart rate? How many beats in 15 seconds?  (Note: Not all patients can do this.)       N/A 8. CAUSE: What do you think is causing the dizziness? (e.g., decreased fluids or food, diarrhea, emotional distress, heat exposure, new medicine, sudden standing, vomiting; unknown)     unsure 9. RECURRENT SYMPTOM: Have you had dizziness before? If Yes, ask: When was the last time? What happened that time?     Yes-patient reports that she had similar symptoms last year after having a stroke.  10. OTHER SYMPTOMS: Do you have any other symptoms? (e.g., fever, chest pain, vomiting, diarrhea, bleeding)       Nausea  Patient calling with concerns that her blood pressure medication needs to be evaluated. Episodes of lightheadedness with changes in positioning. Patient endorses no dizziness at this time but states when the episodes happen, they are moderate-severe. No appointment available per decision tree. Patient states she will go to the ED to be evaluated but she would like a call back about an appointment to see PCP.  Protocols used: Dizziness - Lightheadedness-A-AH

## 2023-11-17 ENCOUNTER — Emergency Department

## 2023-11-17 ENCOUNTER — Emergency Department
Admission: EM | Admit: 2023-11-17 | Discharge: 2023-11-17 | Disposition: A | Discharge status: Home / Self Care | Attending: Emergency Medicine | Admitting: Emergency Medicine

## 2023-11-17 ENCOUNTER — Other Ambulatory Visit: Payer: Self-pay

## 2023-11-17 DIAGNOSIS — N179 Acute kidney failure, unspecified: Secondary | ICD-10-CM | POA: Insufficient documentation

## 2023-11-17 DIAGNOSIS — I10 Essential (primary) hypertension: Secondary | ICD-10-CM | POA: Diagnosis not present

## 2023-11-17 DIAGNOSIS — E119 Type 2 diabetes mellitus without complications: Secondary | ICD-10-CM | POA: Insufficient documentation

## 2023-11-17 DIAGNOSIS — R42 Dizziness and giddiness: Secondary | ICD-10-CM | POA: Insufficient documentation

## 2023-11-17 DIAGNOSIS — R0602 Shortness of breath: Secondary | ICD-10-CM | POA: Diagnosis not present

## 2023-11-17 LAB — COMPREHENSIVE METABOLIC PANEL WITH GFR
ALT: 14 U/L (ref 0–44)
AST: 19 U/L (ref 15–41)
Albumin: 3.7 g/dL (ref 3.5–5.0)
Alkaline Phosphatase: 82 U/L (ref 38–126)
Anion gap: 9 (ref 5–15)
BUN: 33 mg/dL — ABNORMAL HIGH (ref 8–23)
CO2: 17 mmol/L — ABNORMAL LOW (ref 22–32)
Calcium: 9.4 mg/dL (ref 8.9–10.3)
Chloride: 102 mmol/L (ref 98–111)
Creatinine, Ser: 2.65 mg/dL — ABNORMAL HIGH (ref 0.44–1.00)
GFR, Estimated: 20 mL/min — ABNORMAL LOW (ref >60)
Glucose, Bld: 101 mg/dL — ABNORMAL HIGH (ref 70–99)
Potassium: 4.6 mmol/L (ref 3.5–5.1)
Sodium: 128 mmol/L — ABNORMAL LOW (ref 135–145)
Total Bilirubin: 0.8 mg/dL (ref 0.0–1.2)
Total Protein: 6.8 g/dL (ref 6.5–8.1)

## 2023-11-17 LAB — URINALYSIS, ROUTINE W REFLEX MICROSCOPIC
Bilirubin Urine: NEGATIVE
Glucose, UA: NEGATIVE mg/dL
Hgb urine dipstick: NEGATIVE
Ketones, ur: NEGATIVE mg/dL
Leukocytes,Ua: NEGATIVE
Nitrite: NEGATIVE
Protein, ur: NEGATIVE mg/dL
Specific Gravity, Urine: 1.005 (ref 1.005–1.030)
pH: 5 (ref 5.0–8.0)

## 2023-11-17 LAB — BASIC METABOLIC PANEL WITH GFR
Anion gap: 7 (ref 5–15)
BUN: 31 mg/dL — ABNORMAL HIGH (ref 8–23)
CO2: 19 mmol/L — ABNORMAL LOW (ref 22–32)
Calcium: 9.1 mg/dL (ref 8.9–10.3)
Chloride: 105 mmol/L (ref 98–111)
Creatinine, Ser: 2.44 mg/dL — ABNORMAL HIGH (ref 0.44–1.00)
GFR, Estimated: 22 mL/min — ABNORMAL LOW (ref >60)
Glucose, Bld: 101 mg/dL — ABNORMAL HIGH (ref 70–99)
Potassium: 4.8 mmol/L (ref 3.5–5.1)
Sodium: 131 mmol/L — ABNORMAL LOW (ref 135–145)

## 2023-11-17 LAB — CBC
HCT: 33.1 % — ABNORMAL LOW (ref 36.0–46.0)
Hemoglobin: 10.9 g/dL — ABNORMAL LOW (ref 12.0–15.0)
MCH: 31 pg (ref 26.0–34.0)
MCHC: 32.9 g/dL (ref 30.0–36.0)
MCV: 94 fL (ref 80.0–100.0)
Platelets: 222 K/uL (ref 150–400)
RBC: 3.52 MIL/uL — ABNORMAL LOW (ref 3.87–5.11)
RDW: 13 % (ref 11.5–15.5)
WBC: 7.5 K/uL (ref 4.0–10.5)
nRBC: 0 % (ref 0.0–0.2)

## 2023-11-17 MED ORDER — SODIUM CHLORIDE 0.9 % IV BOLUS
1000.0000 mL | Freq: Once | INTRAVENOUS | Status: AC
  Administered 2023-11-17: 1000 mL via INTRAVENOUS

## 2023-11-17 MED ORDER — LACTATED RINGERS IV BOLUS: 1000.0000 mL | Freq: Once | INTRAVENOUS | Status: DC

## 2023-11-17 NOTE — ED Triage Notes (Addendum)
 Pt c/o intermittent dizziness and SOB w/ exertion x1 month.  Pt reports symptoms get worse w/ exertion.  Pt sts I'm fat which is causing this.  Pt reports she has not been eating and drinking well.  Hx of stroke x1 year ago.   Pt reports issues w/ hypotension as well.  Pt thinks she is on too much HTN medication.

## 2023-11-17 NOTE — Discharge Instructions (Addendum)
 You were seen in the emergency room today for evaluation of your lightheadedness.  I think this is likely related to you being dehydrated from not eating and drinking regularly as well as use of your blood pressure medications.  Please make sure you are staying hydrated and eating regular meals.  Do not take your spironolactone  or telmisartan  unless otherwise directed by your primary care doctor.  Please arrange follow-up with them in the next few days.  Return to the ER for any new or worsening symptoms.

## 2023-11-17 NOTE — ED Provider Notes (Signed)
 [facility] Provider Note    Event Date/Time   First MD Initiated Contact with Patient 11/17/23 [phone]     (approximate)   History   Dizziness   HPI  Susan [name] is a 63 year old female with history of HTN, bipolar disorder, T2DM presenting to the ER for evaluation of lightheadedness.  Patient reports that over the last few weeks, worse over the last few days she has had dizziness described as a lightheadedness sensation.  No syncope.  She denied chest pain or shortness of breath to me.  Did note that triage note comments on shortness of breath for a month.  She says that this has been her baseline for a long time, not acutely changed.  Denies vertigo, numbness, tingling, focal weakness.  Does note that she has been compliant with her medications, but feels like her blood pressure has been low.  She also notes that she has been staying by herself recently and frequently does not eat because she forgets to.  Has her medication list with her.  Her blood pressure medications include spironolactone  and telmisartan .  Patient denies known history of CHF.    Physical Exam   Triage Vital Signs: ED Triage Vitals  Encounter Vitals Group     BP 11/17/23 0737 98/67     Girls Systolic BP Percentile --      Girls Diastolic BP Percentile --      Boys Systolic BP Percentile --      Boys Diastolic BP Percentile --      Pulse Rate 11/17/23 0737 92     Resp 11/17/23 0737 20     Temp 11/17/23 0737 98.1 F (36.7 C)     Temp Source 11/17/23 0737 Oral     SpO2 11/17/23 0737 94 %     Weight 11/17/23 0738 238 lb (108 kg)     Height 11/17/23 0738 5' 1 (1.549 m)     Head Circumference --      Peak Flow --      Pain Score 11/17/23 0738 0     Pain Loc --      Pain Education --      Exclude from Growth Chart --     Most recent vital signs: Vitals:   11/17/23 0737  BP: 98/67  Pulse: 92  Resp: 20  Temp: 98.1 F (36.7 C)  SpO2: 94%     General: Awake,  interactive  CV:  Regular rate, good peripheral perfusion.  Resp:  Unlabored respirations, lungs clear to auscultation Abd:  Nondistended, soft, nontender Neuro:  Symmetric facial movement, fluid speech, moving extremities spontaneously and equally   ED Results / Procedures / Treatments   Labs (all labs ordered are listed, but only abnormal results are displayed) Labs Reviewed  COMPREHENSIVE METABOLIC PANEL WITH GFR - Abnormal; Notable for the following components:      Result Value   Sodium 128 (*)    CO2 17 (*)    Glucose, Bld 101 (*)    BUN 33 (*)    Creatinine, Ser 2.65 (*)    GFR, Estimated 20 (*)    All other components within normal limits  CBC - Abnormal; Notable for the following components:   RBC 3.52 (*)    Hemoglobin 10.9 (*)    HCT 33.1 (*)    All other components within normal limits  URINALYSIS, ROUTINE W REFLEX MICROSCOPIC - Abnormal; Notable for the following components:   Color, Urine STRAW (*)  APPearance CLEAR (*)    All other components within normal limits  BASIC METABOLIC PANEL WITH GFR - Abnormal; Notable for the following components:   Sodium 131 (*)    CO2 19 (*)    Glucose, Bld 101 (*)    BUN 31 (*)    Creatinine, Ser 2.44 (*)    GFR, Estimated 22 (*)    All other components within normal limits     EKG EKG independently reviewed and interpreted by myself demonstrates:  EKG demonstrates normal sinus rhythm at a rate of 95, PR 144, QRS 78, QTc 442, no acute ST changes  RADIOLOGY Imaging independently reviewed and interpreted by myself demonstrates:  CXR without focal consolidation  Formal Radiology Read:  DG Chest Portable 1 View Result Date: 11/17/2023 CLINICAL DATA:  Short of breath EXAM: PORTABLE CHEST 1 VIEW COMPARISON:  None Available. FINDINGS: Normal mediastinum and cardiac silhouette. Normal pulmonary vasculature. No evidence of effusion, infiltrate, or pneumothorax. No acute bony abnormality. IMPRESSION: No acute cardiopulmonary  process. Electronically Signed   By: Jackquline Boxer M.D.   On: 11/17/2023 09:13    PROCEDURES:  Critical Care performed: No  Procedures   MEDICATIONS ORDERED IN ED: Medications  sodium chloride  0.9 % bolus 1,000 mL (0 mLs Intravenous Stopped 11/17/23 1010)     IMPRESSION / MDM / ASSESSMENT AND PLAN / ED COURSE  I reviewed the triage vital signs and the nursing notes.  Differential diagnosis includes, but is not limited to, dehydration in setting of poor p.o. intake, diuretic use, symptomatic hypotension, anemia, electrolyte abnormality, lower suspicion for ACS, pneumonia, PE given absence of chest pain and no change in shortness of breath symptoms over several months  Patient's presentation is most consistent with acute presentation with potential threat to life or bodily function.  63 year old female presenting to the emergency department for evaluation of lightheadedness.  Stable vitals on presentation.  Labs demonstrate hyponatremia with sodium of 128, AKI with creatinine of 2.65, acidosis with bicarb of 17.  Stable anemia with hemoglobin of 10.9.  Labs and clinical presentation suggestive of dehydration in the setting of poor p.o. intake compounded by use of antihypertensives including telmisartan  and spironolactone .  I did consider and discussed admission, but patient strongly prefers discharge home.  She is tolerating p.o. intake.  Will give IV fluids here and reassess.  Repeat BMP demonstrates improving labs with sodium of 131, creatinine of 2.44, bicarb of 19.  Remains with AKI, but patient continues to request discharge home.  I do think that she will continue to have improvement in her renal function with better hydration at home and holding her blood pressure medications.  She will arrange close follow-up with her primary care doctor.  Strict return precautions provided.  Patient discharged stable condition.    FINAL CLINICAL IMPRESSION(S) / ED DIAGNOSES   Final diagnoses:   Lightheadedness  Acute kidney injury (HCC)     Rx / DC Orders   ED Discharge Orders     None        Note:  This document was prepared using Dragon voice recognition software and may include unintentional dictation errors.   Levander Slate, MD 11/17/23 [phone]

## 2023-11-21 ENCOUNTER — Ambulatory Visit (INDEPENDENT_AMBULATORY_CARE_PROVIDER_SITE_OTHER): Admitting: Neurosurgery

## 2023-11-21 DIAGNOSIS — G8929 Other chronic pain: Secondary | ICD-10-CM

## 2023-11-21 DIAGNOSIS — M545 Low back pain, unspecified: Secondary | ICD-10-CM

## 2023-11-21 DIAGNOSIS — G834 Cauda equina syndrome: Secondary | ICD-10-CM

## 2023-11-21 DIAGNOSIS — Z9889 Other specified postprocedural states: Secondary | ICD-10-CM

## 2023-11-21 NOTE — Progress Notes (Signed)
   REFERRING PHYSICIAN:  No referring provider defined for this encounter.   DOS: 10/12/22 L3-5 hematoma evacuation DOS:   10/10/22 L3-5 lumbar decompression   HISTORY OF PRESENT ILLNESS: Susan [name] is status post L3-5 lumbar decompression with evacuation of epidural hematoma.   She is walking a little better.  She is still using a walker in public.  She is working towards a cane in public.  Urinary function has improved.   She is having back pain that has worsened.  She would like to start physical therapy back.    PHYSICAL EXAMINATION:  None today - telephone    ROS (Neurologic):  Negative except as noted above  IMAGING: Nothing new to review.   ASSESSMENT/PLAN:  Susan [name] is improving after lumbar decompression.  Unfortunately, she suffered an epidural hematoma requiring return to the operating room for evacuation.    She has had some improvement in functional mobility and urinary dysfunction.  I am very pleased to hear that.  I would like to start her back on physical therapy for her back pain.  I would like to see her back in approximately 2 to 3 months to check her lumbar spine x-rays.  This visit was performed via telephone.  Patient location: home Provider location: office  I spent a total of 4 minutes non-face-to-face activities for this visit on the date of this encounter including review of current clinical condition and response to treatment.  The patient is aware of and accepts the limits of this telehealth visit.   Reeves Daisy MD Department of neurosurgery

## 2023-11-23 NOTE — Telephone Encounter (Unsigned)
 Copied from CRM [id number]. Topic: Clinical - Medication Refill >> Nov 23, 2023  3:22 PM Harlene ORN wrote: Medication: rosuvastatin  (CRESTOR ) 20 MG tablet and spironolactone  (ALDACTONE ) 25 MG tablet  Has the patient contacted their pharmacy? No (Agent: If no, request that the patient contact the pharmacy for the refill. If patient does not wish to contact the pharmacy document the reason why and proceed with request.) (Agent: If yes, when and what did the pharmacy advise?)  This is the patient's preferred pharmacy:   [facility], [place] - [address] [place] [address] Phone: [phone] Fax: [phone]  Is this the correct pharmacy for this prescription? No If no, delete pharmacy and type the correct one.   Has the prescription been filled recently? No  Is the patient out of the medication? Yes  Has the patient been seen for an appointment in the last year OR does the patient have an upcoming appointment? Yes  Can we respond through MyChart? No  Agent: Please be advised that Rx refills may take up to 3 business days. We ask that you follow-up with your pharmacy.

## 2023-12-04 ENCOUNTER — Ambulatory Visit (INDEPENDENT_AMBULATORY_CARE_PROVIDER_SITE_OTHER)

## 2023-12-04 ENCOUNTER — Telehealth: Payer: Self-pay | Admitting: Nurse Practitioner

## 2023-12-04 VITALS — BP 108/66 | HR 91 | Temp 98.0°F | Ht 62.0 in | Wt 237.8 lb

## 2023-12-04 DIAGNOSIS — I69351 Hemiplegia and hemiparesis following cerebral infarction affecting right dominant side: Secondary | ICD-10-CM | POA: Insufficient documentation

## 2023-12-04 DIAGNOSIS — N185 Chronic kidney disease, stage 5: Secondary | ICD-10-CM | POA: Diagnosis not present

## 2023-12-04 DIAGNOSIS — N179 Acute kidney failure, unspecified: Secondary | ICD-10-CM

## 2023-12-04 DIAGNOSIS — R300 Dysuria: Secondary | ICD-10-CM

## 2023-12-04 DIAGNOSIS — D649 Anemia, unspecified: Secondary | ICD-10-CM

## 2023-12-04 DIAGNOSIS — E782 Mixed hyperlipidemia: Secondary | ICD-10-CM | POA: Diagnosis not present

## 2023-12-04 DIAGNOSIS — E1121 Type 2 diabetes mellitus with diabetic nephropathy: Secondary | ICD-10-CM | POA: Diagnosis not present

## 2023-12-04 DIAGNOSIS — Z8673 Personal history of transient ischemic attack (TIA), and cerebral infarction without residual deficits: Secondary | ICD-10-CM | POA: Diagnosis not present

## 2023-12-04 DIAGNOSIS — J432 Centrilobular emphysema: Secondary | ICD-10-CM | POA: Diagnosis not present

## 2023-12-04 DIAGNOSIS — I10 Essential (primary) hypertension: Secondary | ICD-10-CM

## 2023-12-04 DIAGNOSIS — N184 Chronic kidney disease, stage 4 (severe): Secondary | ICD-10-CM | POA: Diagnosis not present

## 2023-12-04 DIAGNOSIS — F3161 Bipolar disorder, current episode mixed, mild: Secondary | ICD-10-CM

## 2023-12-04 DIAGNOSIS — R269 Unspecified abnormalities of gait and mobility: Secondary | ICD-10-CM

## 2023-12-04 DIAGNOSIS — E119 Type 2 diabetes mellitus without complications: Secondary | ICD-10-CM

## 2023-12-04 DIAGNOSIS — N183 Chronic kidney disease, stage 3 unspecified: Secondary | ICD-10-CM | POA: Insufficient documentation

## 2023-12-04 DIAGNOSIS — G4733 Obstructive sleep apnea (adult) (pediatric): Secondary | ICD-10-CM

## 2023-12-04 LAB — URINALYSIS, ROUTINE W REFLEX MICROSCOPIC
Bilirubin Urine: NEGATIVE
Hgb urine dipstick: NEGATIVE
Ketones, ur: NEGATIVE
Nitrite: NEGATIVE
RBC / HPF: NONE SEEN (ref 0–?)
Specific Gravity, Urine: <=1.005 — AB (ref 1.000–1.030)
Total Protein, Urine: NEGATIVE
Urine Glucose: NEGATIVE
Urobilinogen, UA: 0.2 (ref 0.0–1.0)
pH: 6.5 (ref 5.0–8.0)

## 2023-12-04 MED ORDER — OZEMPIC (2 MG/DOSE) 8 MG/3ML ~~LOC~~ SOPN: 2.0000 mg | PEN_INJECTOR | SUBCUTANEOUS | 3 refills | Status: DC

## 2023-12-04 MED ORDER — ROSUVASTATIN CALCIUM 20 MG PO TABS: 20.0000 mg | ORAL_TABLET | Freq: Every day | ORAL | 3 refills | Status: DC

## 2023-12-04 NOTE — Patient Instructions (Addendum)
-   I am putting an urgent referral to nephrology or kidney doctor for evaluation of chronic kidney disease. If you do not hear from us  to schedule an appointment with in the next 2 weeks please let us  know. Nephrologist manages your kidney function.   - I am sending refill on Ozempic , continue using 2 mg once a week.   - I am also sending refill on Crestor  20 mg, take it at bedtime.   - I am discontinuing your Spironolactone  as your blood pressure is low and kidney function has worsened.   - I am referring you to our pharmacist to make sure we know your updated medication list. She will call you to discuss your medication.   - We will get updated eye exam result from [facility] care at [facility].   - You are established with urologist at [facility] and I recommend you call them to schedule an appointment at:  Phone: [phone]. Urologist manage urinary symptoms. Gerldine Lauraine BROCKS, FNP  is your urologist.   - If you are interested in the shingles vaccine series (Shingrix), call your insurance or pharmacy to check on coverage and location it must be given.  If affordable - you can schedule it here or at your pharmacy depending on coverage

## 2023-12-04 NOTE — Assessment & Plan Note (Signed)
 Established with psychiatrist Maryellen Barth, MD. Recommend close follow up for medication and disease management. No SI/HI.

## 2023-12-04 NOTE — Assessment & Plan Note (Addendum)
 AKI on CKD as evident from worsening Cr, GFR.  11/17/23: Cr: 2.65 compared to baseline Cr: 1.48 (03/28/23). Hyponatremia secondary to above.  Likely multifactorial: DM II, HTN nephropathy, obstructive nephropathy (given h/o neurogenic bladder), recurrent UTI.  She was counseled on severity of CKD, counseled on avoiding nephrotoxic medications, importance of staying hydrated.  Urgent nephrology referral made today for further evaluation and to preserve renal function, evaluate for potential dialysis.  Check UA, urine culture.  Patient provided with phone number to call [facility] to schedule an appointment with her urologist.

## 2023-12-04 NOTE — Assessment & Plan Note (Signed)
 Check lipid panel, continue Rosuvastatin  20 mg daily. Refill sent.

## 2023-12-04 NOTE — Assessment & Plan Note (Signed)
 Plan per AKI

## 2023-12-04 NOTE — Assessment & Plan Note (Signed)
 With h/o Cauda Equina, chronic neurogenic bowel/bladder and chronic lower back pain. Previously was going through close PT/OT.  Using walker for ambulation. [facility] and plans on f/u with them. She is also following up with neurosurgeon Dr. Katrina and has an upcoming appointment with him.

## 2023-12-04 NOTE — Assessment & Plan Note (Addendum)
 Check UA, urine culture. Management pending results.  Urgent nephrology referral made.  Patient will call her urologist to schedule an appointment.

## 2023-12-04 NOTE — Telephone Encounter (Signed)
 LVMTCB to schedule rov with Izetta Rouleau NP.

## 2023-12-04 NOTE — Assessment & Plan Note (Signed)
 Evident during ED visit on 11/17/23. Likely anemia of chronic disease, check CBC, iron panel, B 12.

## 2023-12-04 NOTE — Assessment & Plan Note (Signed)
 A1c has been stable. Repeat A1c, continue Ozempic  2 mg once weekly injection, refill sent.

## 2023-12-04 NOTE — Assessment & Plan Note (Signed)
-   Chronic use of  CPAP. Recommend sleep specialist referral.

## 2023-12-04 NOTE — Assessment & Plan Note (Signed)
 Quit smoking in 2024. Now she is vaping and not sure if it has nicotine  in it.

## 2023-12-04 NOTE — Progress Notes (Signed)
 Established Patient Office Visit TOC from Dr. Maribeth    Subjective  Patient ID: Leonor LITTIE Drafts, female    DOB: 01-02-1961  Age: 63 y.o. MRN: [id number]  Chief Complaint  Patient presents with   Establish Care   Dysuria    She  has a past medical history of ADD (attention deficit disorder), Alcohol abuse, Anemia, Anxiety, Aortic atherosclerosis (HCC) (02/20/2018), Arthritis, Asthma, Bipolar disorder (HCC), Centrilobular emphysema (HCC), Chronic diarrhea (07/22/2022), Cocaine use disorder, moderate, in sustained remission (HCC), Constipation, Degenerative disc disease at L5-S1 level (09/28/2016), Depression, Diabetes mellitus, type 2 (HCC), Diverticulitis (2013), DOE (dyspnea on exertion), Drug use, Family history of adverse reaction to anesthesia, GERD (gastroesophageal reflux disease), H/O suicide attempt, Hepatitis C (06/26/2014), Hip pain, History of echocardiogram, History of MRSA infection (2013), HLD (hyperlipidemia), Hypertension, Hyponatremia, Hypothyroidism, Incisional hernia (11/08/2012), Knee pain, Left carotid bruit, Morbid obesity (HCC), Multinodular thyroid , OSA (obstructive sleep apnea), OSA on CPAP, Osteoporosis, Palpitations, Post-traumatic osteoarthritis of right knee (09/24/2015), Recurrent ventral hernia (11/08/2012), Skin lesion (06/28/2022), Status post total hip replacement, 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  HPI Discussed the use of AI scribe software for clinical note transcription with the patient, who gave verbal consent to proceed.  History of Present Illness CADANCE RAUS is a 63 year old female who presents for transfer of care from previous PCP and chronic medication management.   - [place] and is living in  [place].   - Was seen at ED on 11/17/23. Was diagnosed with AKI on CKD:  BP was 98/67 mmHg.  Had hyponatremia with Na: 128 Cr: 2.65, elevated from baseline Cr: 1.48 (03/28/23) Normocytic, normochromic anemia: Hb 10.9,  12 lead EKG: sinus rhythm, IVF, AKI on CKD< GFR 20-22.  She was treated with IV fluid and recommended to f/u with PCP.  She has been referred to nephrologist in the past but has not seen them since hospital visit.  She is established with urologist at [facility], Lauraine BROCKS, FNP) and is due for a f/u.  - She is complaining of recurrent UTI like symptoms. Is stating she has been having painful micturition, mostly towards end of voiding. No fever, chills. Has been drinking more water  than usual. Is on Vibegron  75 mg once a day.   - She has a h/o OSA, COPD. She used to be a smoker, quit in 04/2023. Currently vaping and not sure if she is vaping nicotine . She had last pulmonology appointment in 05/08/2019 with Dr. Tamea and needs to reestablish care with pulmonologist. Currently denies chest pain, cough.   - Uses walker for ambulation (s/p stroke, obesity, back pain). She used to see PMR medicine at [facility] (Dr. Emeline). On Lyrica  150 mg twice daily   - DM II: Ozempic  2 mg once a week for blood sugar management. Her hemoglobin A1c levels have been well-controlled.  - Hyperlipidemia: Her current medications include Crestor  20 mg once a day.   - HTN: On spironolactone , Telmisartan  (not sure if she is taking this), Carvedilol . She has not been checking BP. She is established with cardiology but has not been following up with them.    - Bipolar, insomnia: Established with psychiatrist Maryellen Barth, MD. On Trazadone, Cymbalta , Latuda .    ROS As per HPI    Objective:     BP 108/66 (BP Location:  Right Arm, Patient Position: Sitting, Cuff Size: Normal)   Pulse 91   Temp 98 F (36.7 C) (Oral)   Ht 5' 2 (1.575 m)   Wt 237 lb 12.8 oz (107.9 kg)   SpO2 96%    BMI 43.49 kg/m      12/04/2023   11:16 AM 11/03/2023    1:14 PM 10/09/2023   11:11 AM  Depression screen PHQ 2/9  Decreased Interest 0 1   Down, Depressed, Hopeless 0 0   PHQ - 2 Score 0 1   Altered sleeping 0 2   Tired, decreased energy 0 1   Change in appetite 1 1   Feeling bad or failure about yourself  0 0   Trouble concentrating 0 1   Moving slowly or fidgety/restless 0 0   Suicidal thoughts 0 0   PHQ-9 Score 1 6   Difficult doing work/chores Somewhat difficult Somewhat difficult      Information is confidential and restricted. Go to Review Flowsheets to unlock data.      12/04/2023   11:16 AM 11/03/2023    1:15 PM 10/09/2023   11:11 AM 03/28/2023    9:22 AM  GAD 7 : Generalized Anxiety Score  Nervous, Anxious, on Edge 1 0  1  Control/stop worrying 0 0  1  Worry too much - different things 0 0  1  Trouble relaxing 0 0  1  Restless 0 0  0  Easily annoyed or irritable 0 1  1  Afraid - awful might happen 0 0  0  Total GAD 7 Score 1 1  5   Anxiety Difficulty Somewhat difficult Somewhat difficult  Somewhat difficult     Information is confidential and restricted. Go to Review Flowsheets to unlock data.      12/04/2023   11:16 AM 11/03/2023    1:14 PM 10/09/2023   11:11 AM  Depression screen PHQ 2/9  Decreased Interest 0 1   Down, Depressed, Hopeless 0 0   PHQ - 2 Score 0 1   Altered sleeping 0 2   Tired, decreased energy 0 1   Change in appetite 1 1   Feeling bad or failure about yourself  0 0   Trouble concentrating 0 1   Moving slowly or fidgety/restless 0 0   Suicidal thoughts 0 0   PHQ-9 Score 1 6   Difficult doing work/chores Somewhat difficult Somewhat difficult      Information is confidential and restricted. Go to Review Flowsheets to unlock data.      12/04/2023   11:16 AM 11/03/2023    1:15 PM 10/09/2023   11:11 AM 03/28/2023    9:22 AM  GAD 7 : Generalized Anxiety Score  Nervous, Anxious, on Edge 1 0  1  Control/stop worrying 0 0  1  Worry too much -  different things 0 0  1  Trouble relaxing 0 0  1  Restless 0 0  0  Easily annoyed or irritable 0 1  1  Afraid - awful might happen 0 0  0  Total GAD 7 Score 1 1  5   Anxiety Difficulty Somewhat difficult Somewhat difficult  Somewhat difficult     Information is confidential and restricted. Go to Review Flowsheets to unlock data.   SDOH Screenings   Food Insecurity: No Food Insecurity (03/29/2023)  Housing: Patient Declined (03/29/2023)  Transportation Needs: No Transportation Needs (03/29/2023)  Utilities: Not At Risk (03/29/2023)  Alcohol Screen: Low Risk  (03/29/2023)  Depression (  PHQ2-9): Low Risk  (12/04/2023)  Recent Concern: Depression (PHQ2-9) - Medium Risk (11/03/2023)  Financial Resource Strain: Low Risk  (03/29/2023)  Physical Activity: Inactive (03/29/2023)  Social Connections: Moderately Isolated (03/29/2023)  Stress: No Stress Concern Present (03/29/2023)  Tobacco Use: Medium Risk (12/04/2023)  Health Literacy: Adequate Health Literacy (03/29/2023)     Physical Exam Constitutional:      General: She is not in acute distress.    Appearance: Normal appearance. She is obese.  HENT:     Head: Normocephalic and atraumatic.     Mouth/Throat:     Mouth: Mucous membranes are moist.  Neck:     Thyroid : No thyroid  mass or thyroid  tenderness.  Cardiovascular:     Rate and Rhythm: Normal rate and regular rhythm.  Pulmonary:     Effort: Pulmonary effort is normal.     Breath sounds: Normal breath sounds. No wheezing.  Abdominal:     General: Bowel sounds are normal.     Palpations: Abdomen is soft.     Tenderness: There is no right CVA tenderness, left CVA tenderness or guarding.  Musculoskeletal:     Cervical back: Neck supple. No rigidity.     Right lower leg: No edema.     Left lower leg: No edema.  Skin:    General: Skin is warm.  Neurological:     Mental Status: She is alert and oriented to person, place, and time.     Gait: Gait abnormal.  Psychiatric:         Mood and Affect: Mood normal.        Behavior: Behavior normal.        No results found for any visits on 12/04/23.  The ASCVD Risk score (Arnett DK, et al., 2019) failed to calculate for the following reasons:   Risk score cannot be calculated because patient has a medical history suggesting prior/existing ASCVD     Assessment & Plan:   AKI (acute kidney injury) [facility]) Assessment & Plan: AKI on CKD as evident from worsening Cr, GFR.  11/17/23: Cr: 2.65 compared to baseline Cr: 1.48 (03/28/23). Hyponatremia secondary to above.  Likely multifactorial: DM II, HTN nephropathy, obstructive nephropathy (given h/o neurogenic bladder), recurrent UTI.  She was counseled on severity of CKD, counseled on avoiding nephrotoxic medications, importance of staying hydrated.  Urgent nephrology referral made today for further evaluation and to preserve renal function, evaluate for potential dialysis.  Check UA, urine culture.  Patient provided with phone number to call [facility] to schedule an appointment with her urologist.   Orders: -     Comprehensive metabolic panel with GFR -     Urine Culture -     Urinalysis, Routine w reflex microscopic -     Ambulatory referral to Nephrology -     AMB Referral VBCI Care Management  Type 2 diabetes mellitus without complication, without long-term current use of insulin  (HCC) Assessment & Plan: A1c has been stable. Repeat A1c, continue Ozempic  2 mg once weekly injection, refill sent.   Orders: -     Ozempic  (2 MG/DOSE); Inject 2 mg into the skin once a week.  Dispense: 3 mL; Refill: 3 -     Hemoglobin A1c -     Ambulatory referral to Nephrology  Primary hypertension Assessment & Plan: BP within goal today. Given AKI on CKD recommend discontinuing Spironolactone . Continue Telmisartan  80 mg once a day, Carvedilol  12.5 mg BID. Concerned for safety of continuing Telmisartan  given stable BP, worsening CKD.  Urgent nephrology referral made.  Also  recommend patient to follow up with cardiologist. Follow up in 2 weeks, check home BP 2-3 times a week, bring home BP reading during follow up appointment.   Orders: -     AMB Referral VBCI Care Management  Mixed hyperlipidemia Assessment & Plan: Check lipid panel, continue Rosuvastatin  20 mg daily. Refill sent.   Orders: -     Rosuvastatin  Calcium ; Take 1 tablet (20 mg total) by mouth daily.  Dispense: 90 tablet; Refill: 3 -     Lipid panel  Centrilobular emphysema (HCC) Assessment & Plan: Only on prn Albuterol . Patient has seen Dr. Tamea back in 2020. She will reestablish care with pulmonology  for COPD management and OSA follow up. Referral made today.  Orders: -     Pulmonary Visit  Morbid obesity (HCC) Assessment & Plan: Lifestyle modification with diet, regular exercise that is safe and as tolerated for patient recommended.    Type II diabetes mellitus with nephropathy (HCC) Assessment & Plan: A1c has been stable. Repeat A1c, continue Ozempic  2 mg once weekly injection, refill sent.   Orders: -     AMB Referral VBCI Care Management  Normocytic normochromic anemia Assessment & Plan: Evident during ED visit on 11/17/23. Likely anemia of chronic disease, check CBC, iron panel, B 12.   Orders: -     CBC -     Vitamin B12 -     Iron, TIBC and Ferritin Panel  Abnormality of gait and mobility Assessment & Plan: With h/o Cauda Equina, chronic neurogenic bowel/bladder and chronic lower back pain. Previously was going through close PT/OT.  Using walker for ambulation. [facility] and plans on f/u with them. She is also following up with neurosurgeon Dr. Katrina and has an upcoming appointment with him.    Bipolar 1 disorder, mixed, mild (HCC) Assessment & Plan: Established with psychiatrist Maryellen Barth, MD. Recommend close follow up for medication and disease management. No SI/HI.    History of stroke Assessment & Plan: On Aspirin   81 mg, Rosuvastatin  20 mg daily, continue.   OSA on CPAP Assessment & Plan: - Chronic use of  CPAP. Recommend sleep specialist referral.   Orders: -     Pulmonary Visit  Dysuria Assessment & Plan: Check UA, urine culture. Management pending results.  Urgent nephrology referral made.  Patient will call her urologist to schedule an appointment.   Stage 4 chronic kidney disease (HCC) Assessment & Plan: Plan per AKI  Orders: -     Ambulatory referral to Nephrology   I spent 50 minutes on the day of this face-to-face encounter reviewing the patient's medical and surgical history, medications, ongoing concerns, and reviewing the assessment and plan with the patient. This time also included counseling the patient on their health conditions and management options. Additionally, I spent time post-visit ordering and reviewing diagnostics and therapeutics with the patient.  Return for chronic follow up in 2-3 weeks with Dr. Abbey.   Luke Abbey, MD

## 2023-12-04 NOTE — Assessment & Plan Note (Addendum)
 BP within goal today. Given AKI on CKD recommend discontinuing Spironolactone . Continue Telmisartan  80 mg once a day, Carvedilol  12.5 mg BID. Concerned for safety of continuing Telmisartan  given stable BP, worsening CKD. Urgent nephrology referral made.  Also recommend patient to follow up with cardiologist. Follow up in 2 weeks, check home BP 2-3 times a week, bring home BP reading during follow up appointment.

## 2023-12-04 NOTE — Assessment & Plan Note (Signed)
 Has not drank alcohol for the last 7 years. Encouraged patient on continuation of remission.

## 2023-12-04 NOTE — Assessment & Plan Note (Signed)
 On Aspirin  81 mg, Rosuvastatin  20 mg daily, continue.

## 2023-12-04 NOTE — Assessment & Plan Note (Signed)
 Lifestyle modification with diet, regular exercise that is safe and as tolerated for patient recommended.

## 2023-12-04 NOTE — Assessment & Plan Note (Signed)
 Only on prn Albuterol . Patient has seen Dr. Tamea back in 2020. She will reestablish care with pulmonology  for COPD management and OSA follow up. Referral made today.

## 2023-12-04 NOTE — Addendum Note (Signed)
 Addended by: TANDA HARVEY D on: 12/04/2023 01:18 PM   Modules accepted: Orders

## 2023-12-05 ENCOUNTER — Ambulatory Visit: Payer: Self-pay

## 2023-12-05 DIAGNOSIS — N3000 Acute cystitis without hematuria: Secondary | ICD-10-CM

## 2023-12-05 LAB — COMPREHENSIVE METABOLIC PANEL WITH GFR
AG Ratio: 1.6 (calc) (ref 1.0–2.5)
ALT: 10 U/L (ref 6–29)
AST: 14 U/L (ref 10–35)
Albumin: 3.9 g/dL (ref 3.6–5.1)
Alkaline phosphatase (APISO): 82 U/L (ref 37–153)
BUN/Creatinine Ratio: 7 (calc) (ref 6–22)
BUN: 10 mg/dL (ref 7–25)
CO2: 20 mmol/L (ref 20–32)
Calcium: 8.8 mg/dL (ref 8.6–10.4)
Chloride: 104 mmol/L (ref 98–110)
Creat: 1.35 mg/dL — ABNORMAL HIGH (ref 0.50–1.05)
Globulin: 2.4 g/dL (ref 1.9–3.7)
Glucose, Bld: 81 mg/dL (ref 65–99)
Potassium: 4.3 mmol/L (ref 3.5–5.3)
Sodium: 137 mmol/L (ref 135–146)
Total Bilirubin: 0.4 mg/dL (ref 0.2–1.2)
Total Protein: 6.3 g/dL (ref 6.1–8.1)
eGFR: 44 mL/min/1.73m2 — ABNORMAL LOW (ref >=60)

## 2023-12-05 LAB — LIPID PANEL
Cholesterol: 98 mg/dL (ref <200)
HDL: 42 mg/dL — ABNORMAL LOW (ref >=50)
LDL Cholesterol (Calc): 39 mg/dL
Non-HDL Cholesterol (Calc): 56 mg/dL (ref <130)
Total CHOL/HDL Ratio: 2.3 (calc) (ref <5.0)
Triglycerides: 87 mg/dL (ref <150)

## 2023-12-05 LAB — CBC
HCT: 31.5 % — ABNORMAL LOW (ref 35.0–45.0)
Hemoglobin: 10.2 g/dL — ABNORMAL LOW (ref 11.7–15.5)
MCH: 31.3 pg (ref 27.0–33.0)
MCHC: 32.4 g/dL (ref 32.0–36.0)
MCV: 96.6 fL (ref 80.0–100.0)
MPV: 9 fL (ref 7.5–12.5)
Platelets: 202 Thousand/uL (ref 140–400)
RBC: 3.26 Million/uL — ABNORMAL LOW (ref 3.80–5.10)
RDW: 12.3 % (ref 11.0–15.0)
WBC: 8.1 Thousand/uL (ref 3.8–10.8)

## 2023-12-05 LAB — HEMOGLOBIN A1C
Hgb A1c MFr Bld: 5.3 % (ref <5.7)
Mean Plasma Glucose: 105 mg/dL
eAG (mmol/L): 5.8 mmol/L

## 2023-12-05 LAB — VITAMIN B12: Vitamin B-12: 344 pg/mL (ref 200–1100)

## 2023-12-05 MED ORDER — AMOXICILLIN-POT CLAVULANATE 500-125 MG PO TABS: 1.0000 | ORAL_TABLET | Freq: Two times a day (BID) | ORAL | 0 refills | Status: DC

## 2023-12-05 NOTE — Progress Notes (Signed)
 Please let the patient know her UA is suggestive of urinary tract infection and urine culture is pending. I recommend starting Augmentin  twice a day for 5 days for treatment. Take over the counter probiotic, once a day for 10 days to help reduce antibiotic side effects.  We will reach out to the patient once culture result is back. I also strongly recommend patient schedule an appointment with her urologist Lauraine Oz. She should anticipate a call from nephrology clinic to schedule an appointment for evaluation of CKD.   She has anemia from chronic disease. Iron, B 12 came back normal. Kidney function is slightly improved compared to 2 weeks ago. A1c and cholesterol looks stable.   Thank you,  Luke Shade, MD

## 2023-12-05 NOTE — Progress Notes (Signed)
 Spoke with the [facility] and was informed they would fax over eye exam, but they do not perform Diabetic Eye Exam.

## 2023-12-06 ENCOUNTER — Telehealth: Payer: Self-pay

## 2023-12-06 LAB — IRON,TIBC AND FERRITIN PANEL
%SAT: 18 % (ref 16–45)
Ferritin: 66 ng/mL (ref 16–288)
Iron: 52 ug/dL (ref 45–160)
TIBC: 297 ug/dL (ref 250–450)

## 2023-12-06 LAB — URINE CULTURE
MICRO NUMBER:: 16753463
SPECIMEN QUALITY:: ADEQUATE

## 2023-12-06 LAB — EXTRA LAV TOP TUBE

## 2023-12-06 NOTE — Telephone Encounter (Signed)
 Copied from CRM [id number]. Topic: Clinical - Lab/Test Results >> Dec 06, 2023  8:53 AM Susan [name] wrote: Reason for CRM: Patient called in regarding missed call , relay lab results to patient, patient stated she has already scheduled an appointment with the urologist and stated she understood and had no further questions

## 2023-12-06 NOTE — Progress Notes (Unsigned)
 Complex Care Management Note Care Guide Note  12/06/2023 Name: Susan [name] MRN: [id number] DOB: 02/15/1961   Complex Care Management Outreach Attempts: An unsuccessful telephone outreach was attempted today to offer the patient information about available complex care management services.  Follow Up Plan:  Additional outreach attempts will be made to offer the patient complex care management information and services.   Encounter Outcome:  No Answer  Dreama [facility]  [facility], [facility] Care Management Assistant Direct Dial: [phone]  Fax: [phone]

## 2023-12-06 NOTE — Telephone Encounter (Signed)
 Copied from CRM [id number]. Topic: Clinical - Lab/Test Results >> Dec 06, 2023  8:53 AM Thersia BROCKS wrote: Reason for CRM: Patient called in regarding missed call , relay lab results to patient, patient stated she has already scheduled an appointment with the urologist and stated she understood and had no further questions

## 2023-12-07 NOTE — Progress Notes (Signed)
 Complex Care Management Note  Care Guide Note 12/07/2023 Name: BLANDINA RENALDO MRN: [id number] DOB: [birth year]  Leonor LITTIE Drafts is a 63 y.o. year old female who sees Bair, Kalpana, MD for primary care. I reached out to [facility] by phone today to offer complex care management services.  Ms. Crosley was given information about Complex Care Management services today including:   The Complex Care Management services include support from the care team which includes your Nurse Care Manager, Clinical Social Worker, or Pharmacist.  The Complex Care Management team is here to help remove barriers to the health concerns and goals most important to you. Complex Care Management services are voluntary, and the patient may decline or stop services at any time by request to their care team member.   Complex Care Management Consent Status: Patient agreed to services and verbal consent obtained.   Follow up plan:  Telephone appointment with complex care management team member scheduled for:  12/11/23 at 2:00 p.m.  Encounter Outcome:  Patient Scheduled  [facility]  [facility], [facility] Care Management Assistant Direct Dial: [phone]  Fax: [phone]

## 2023-12-11 ENCOUNTER — Telehealth: Payer: Self-pay

## 2023-12-11 ENCOUNTER — Other Ambulatory Visit

## 2023-12-11 ENCOUNTER — Encounter: Payer: Self-pay | Admitting: Pharmacist

## 2023-12-11 NOTE — Progress Notes (Signed)
Attempted to contact patient for scheduled appointment for medication management. Left HIPAA compliant message for patient to return my call at their convenience.   

## 2023-12-11 NOTE — Progress Notes (Deleted)
 12/11/2023 Name: Susan [name] MRN: [id number] DOB: 1961-02-11  Subjective  No chief complaint on file.   Care Team: Primary Care Provider: Bair, Kalpana, MD  Reason for visit: {Visit Type:26650}; Susan [name] is a 63 y.o. female who presents today for a phone visit with the pharmacist for a medication review visit to discuss Medication adherence and polypharmacy management in the context of AKI on CKD.    Medication Access/Adherence: ?  Prescription drug coverage: Payor: Advertising copywriter MEDICARE / Plan: DREMA DUAL COMPLETE / Product Type: *No Product type* / .   - Reports that all medications are affordable: {YES/NO:21197} - Medication packaging: {LZ med packaging:31508} - Patient manages their own medications/refills: {YES/NO:21197} - Uses Pillbox: {YES/NO:21197} - Missed medication doses: {LZ med adherence:31509}  HPI: Seen at ED 11/17/23, diagnosed with AKI.   Patient reports concern regarding ***.  Patient {HAS/DOES NOT YJCZ:65250} their medications with then at the time of the visit.   Outpatient Encounter Medications as of 12/11/2023  Medication Sig Renal dosing consideration   acetaminophen  (TYLENOL ) 500 MG tablet Take 1,000 mg by mouth 2 (two) times daily.    albuterol  (VENTOLIN  HFA) 108 (90 Base) MCG/ACT inhaler Inhale 2 puffs into the lungs every 6 (six) hours as needed for wheezing or shortness of breath.    amoxicillin -clavulanate (AUGMENTIN ) 500-125 MG tablet Take 1 tablet by mouth in the morning and at bedtime. CrCl10-30: 250-500 q12h   aspirin  EC 81 MG tablet Take by mouth.    carvedilol  (COREG ) 12.5 MG tablet Take 12.5 mg by mouth 2 (two) times daily.    clotrimazole  (LOTRIMIN ) 1 % cream Apply 1 application  topically 2 (two) times daily as needed.    cyclobenzaprine  (FLEXERIL ) 5 MG tablet Take 5 mg by mouth 2 (two) times daily. N/A   docusate sodium  (COLACE) 100 MG capsule Take 1 capsule (100 mg total) by mouth daily. Take with breakfast N/A -  minimal systemic absorption   DULoxetine  (CYMBALTA ) 20 MG capsule TAKE ONE CAPSULE BY MOUTH DAILY *TAKE WITH 30 MG CAPSULE Package insert: Avoid in CrCl <30.  Some use cautiously off label in lower GFR to max 60 mg/d   DULoxetine  (CYMBALTA ) 30 MG capsule TAKE 1 CAPSULE BY MOUTH DAILY *TAKE WITH 20MG  CAPSULE    HYDROcodone -acetaminophen  (NORCO/VICODIN) 5-325 MG tablet Take 1 tablet by mouth daily as needed for moderate pain. 02/13/2023: Not taken in 2 days. NOT RX BY [facility]   lurasidone  (LATUDA ) 20 MG TABS tablet TAKE 1 TABLET(20 MG) BY MOUTH DAILY WITH SUPPER CrCl<50: Max 80 mg/d   naloxone (NARCAN) nasal spray 4 mg/0.1 mL Place into the nose. N/A   PERIOGARD  0.12 % solution Use as directed 10 mLs in the mouth or throat 3 (three) times daily as needed. N/A   pregabalin  (LYRICA ) 150 MG capsule Take 1 capsule (150 mg total) by mouth 2 (two) times daily. GFR 30-60: 50% of usual dose  Chronic pain up to 300-600 mg/d divided ? 150-300 mg/d divided   rosuvastatin  (CRESTOR ) 20 MG tablet Take 1 tablet (20 mg total) by mouth daily. Max dose 10 mg in GFR <30   Semaglutide , 2 MG/DOSE, (OZEMPIC , 2 MG/DOSE,) 8 MG/3ML SOPN Inject 2 mg into the skin once a week. N/A   telmisartan  (MICARDIS ) 80 MG tablet Take 80 mg by mouth daily. N/A Hold in the setting of AKI   traZODone  (DESYREL ) 50 MG tablet Take 1 tablet (50 mg total) by mouth at bedtime as needed for sleep. N/A  Vibegron  (GEMTESA ) 75 MG TABS Take 1 tablet (75 mg total) by mouth daily. Avoid in GFR <15   No facility-administered encounter medications on file as of 12/11/2023.   Assessment and Plan:   Medication Review DDI / Adverse Effect Consideration: Sedative/CNS depression: cyclobenzaprine , lurasidone , Lyrica , trazodone  Serotonergic: duloxetine , trazodone   CKD G3b A? (Last UACR 2021) Renal dosing Consideration: Baseline eGFR ~37-45 over the pasr year. Most recently, 22 mL/min in the setting of AKI, recovered to usual baseline, 44 mL/min on 12/04/23.   Current medication doses are appropriate for level renal dysfunction Telmisartan  appropriate in the setting of CKD with eGFR return to baseline. Nephrology referral pending. RAASi remain renal protective in these populations.  KDIGO 3.6.7: Continue ACEi or ARB in people with CKD even when the eGFR falls below 30 mL/mon (STOP-ACEi trial) Potassium WNL Nephrology referral pending. If not scheduled before next PCP follow up, consider adding UACR to get a more complete picture of CKD staging while awaiting nephro appointment.   All medications were reviewed with patient today and medication list was updated as appropriate.  Barriers identified / Adherence concerns: *** ***   Medications as of ***  ? Medication name Instructions  Notes   Diabetes***      ***     ***     ***     ***     ***     ***     Hypertension***     ***     ***     ***     ***     ***     Hyperlipidemia***     ***     ***     ***     ***     ***     ***     ***     ***     ***     ***     ***     ***     ***     ***     ***       Future Appointments  Date Time Provider Department Center  12/11/2023  2:00 PM [facility] PHARMACIST [facility]  12/22/2023  8:00 AM Abbey Bruckner, MD [facility]  12/25/2023  9:00 AM Malachy Comer GAILS, NP [facility] None  01/15/2024  1:20 PM Coby Height, MD [facility] None  02/15/2024  9:45 AM Clois Fret, MD [facility] None  04/01/2024  9:30 AM [facility] ANNUAL WELLNESS VISIT [facility]    Manuelita FABIENE Kobs, PharmD Clinical Pharmacist [facility] [phone]

## 2023-12-11 NOTE — Telephone Encounter (Unsigned)
 Copied from CRM [id number]. Topic: General - Other >> Dec 11, 2023  4:12 PM Gennette ORN wrote: Reason for CRM: Patient has a missed call from [facility]. Please call patient back.

## 2023-12-12 ENCOUNTER — Encounter: Payer: Self-pay | Admitting: Pharmacist

## 2023-12-12 ENCOUNTER — Other Ambulatory Visit: Admitting: Pharmacist

## 2023-12-12 NOTE — Telephone Encounter (Unsigned)
 Copied from CRM [id number]. Topic: General - Other >> Dec 12, 2023  3:44 PM Mesmerise C wrote: Reason for CRM: Patient returning a call for Manuelita to set up medication management patient said she was on a call at the time but didn't get a number to reach back at patient would like a call back at [phone]

## 2023-12-12 NOTE — Progress Notes (Unsigned)
 12/12/2023 Name: Susan [name] MRN: [id number] DOB: 1960/07/21  Subjective  No chief complaint on file.   Care Team: Primary Care Provider: Bair, Kalpana, MD  Reason for visit: {Visit Type:26650}; Susan [name] is a 63 y.o. female who presents today for a phone visit with the pharmacist for a medication review visit to discuss Medication adherence and polypharmacy management in the context of AKI on CKD.    Medication Access/Adherence: ?  Prescription drug coverage: Payor: Advertising copywriter MEDICARE / Plan: DREMA DUAL COMPLETE / Product Type: *No Product type* / .   - Reports that all medications are affordable: {YES/NO:21197} - Medication packaging: [facility] - Patient manages their own medications/refills: {YES/NO:21197} - Uses Pillbox: {YES/NO:21197} - Missed medication doses: {LZ med adherence:31509}  HPI: Seen at ED 11/17/23, diagnosed with AKI.  6-7 pills in her pill pack.  Asa 81 -- round white? 660 Duloxetine  DR 20 mg Duloxetine  DR 30 mg Rosuvastatin  20 mg Spironolactone  25 mg white w 660 Telmisartan  80 mg    Patient has their medications with then at the time of the visit.  She notes her medications are packaged in compliance packaging. She is unsure which medication is spironolactone  so she has been taking it since last visit.   Outpatient Encounter Medications as of 12/11/2023  Medication Sig Renal dosing consideration   acetaminophen  (TYLENOL ) 500 MG tablet Take 1,000 mg by mouth 2 (two) times daily.    albuterol  (VENTOLIN  HFA) 108 (90 Base) MCG/ACT inhaler Inhale 2 puffs into the lungs every 6 (six) hours as needed for wheezing or shortness of breath.    amoxicillin -clavulanate (AUGMENTIN ) 500-125 MG tablet Take 1 tablet by mouth in the morning and at bedtime. CrCl10-30: 250-500 q12h   aspirin  EC 81 MG tablet Take by mouth.    carvedilol  (COREG ) 12.5 MG tablet Take 12.5 mg by mouth 2 (two) times daily.    clotrimazole  (LOTRIMIN ) 1 %  cream Apply 1 application  topically 2 (two) times daily as needed.    cyclobenzaprine  (FLEXERIL ) 5 MG tablet Take 5 mg by mouth 2 (two) times daily. N/A   docusate sodium  (COLACE) 100 MG capsule Take 1 capsule (100 mg total) by mouth daily. Take with breakfast N/A - minimal systemic absorption   DULoxetine  (CYMBALTA ) 20 MG capsule TAKE ONE CAPSULE BY MOUTH DAILY *TAKE WITH 30 MG CAPSULE Package insert: Avoid in CrCl <30.  Some use cautiously off label in lower GFR to max 60 mg/d   DULoxetine  (CYMBALTA ) 30 MG capsule TAKE 1 CAPSULE BY MOUTH DAILY *TAKE WITH 20MG  CAPSULE    HYDROcodone -acetaminophen  (NORCO/VICODIN) 5-325 MG tablet Take 1 tablet by mouth daily as needed for moderate pain. 02/13/2023: Not taken in 2 days. NOT RX BY PM&R   lurasidone  (LATUDA ) 20 MG TABS tablet TAKE 1 TABLET(20 MG) BY MOUTH DAILY WITH SUPPER CrCl<50: Max 80 mg/d   naloxone (NARCAN) nasal spray 4 mg/0.1 mL Place into the nose. N/A   PERIOGARD  0.12 % solution Use as directed 10 mLs in the mouth or throat 3 (three) times daily as needed. N/A   pregabalin  (LYRICA ) 150 MG capsule Take 1 capsule (150 mg total) by mouth 2 (two) times daily. GFR 30-60: 50% of usual dose  Chronic pain up to 300-600 mg/d divided ? 150-300 mg/d divided   rosuvastatin  (CRESTOR ) 20 MG tablet Take 1 tablet (20 mg total) by mouth daily. Max dose 10 mg in GFR <30   Semaglutide , 2 MG/DOSE, (OZEMPIC , 2 MG/DOSE,) 8 MG/3ML SOPN Inject  2 mg into the skin once a week. N/A   telmisartan  (MICARDIS ) 80 MG tablet Take 80 mg by mouth daily. N/A Hold in the setting of AKI   traZODone  (DESYREL ) 50 MG tablet Take 1 tablet (50 mg total) by mouth at bedtime as needed for sleep. N/A   Vibegron  (GEMTESA ) 75 MG TABS Take 1 tablet (75 mg total) by mouth daily. Avoid in GFR <15   No facility-administered encounter medications on file as of 12/11/2023.   Assessment and Plan:   Medication Review STOP spironolactone . Reviewed all medications by color/markings and  identified spironolactone  tablet.  [facility] compliance packaging. [facility] team messaged.   DDI / Adverse Effect Consideration: Sedative/CNS depression: cyclobenzaprine , lurasidone , Lyrica , trazodone  Serotonergic: duloxetine , trazodone   CKD G3b A? (Last UACR 2021) Renal dosing Consideration: Baseline eGFR ~37-45 over the pasr year. Most recently, 22 mL/min in the setting of AKI, recovered to usual baseline, 44 mL/min on 12/04/23.  Current medication doses are appropriate for level renal dysfunction Telmisartan  appropriate in the setting of CKD with eGFR return to baseline. Nephrology referral pending. RAASi remain renal protective in these populations.  KDIGO 3.6.7: Continue ACEi or ARB in people with CKD even when the eGFR falls below 30 mL/mon (STOP-ACEi trial) Potassium WNL Nephrology referral pending. If not scheduled before next PCP follow up, consider adding UACR to get a more complete picture of CKD staging while awaiting nephro appointment.   All medications were reviewed with patient today and medication list was updated as appropriate.  Barriers identified / Adherence concerns: *** ***   Future Appointments  Date Time Provider Department Center  12/12/2023  4:00 PM [facility]  12/22/2023  8:00 AM Abbey Bruckner, MD [facility]  12/25/2023  9:00 AM Malachy Comer GAILS, NP [facility] None  01/15/2024  1:20 PM Coby Height, MD [facility] None  02/15/2024  9:45 AM Clois Fret, MD [facility] None  04/01/2024  9:30 AM [facility] ANNUAL WELLNESS VISIT [facility]    Manuelita FABIENE Kobs, PharmD Clinical Pharmacist [facility] [phone]

## 2023-12-12 NOTE — Progress Notes (Signed)
 Attempted to contact patient for scheduled appointment for medication management. Left HIPAA compliant message for patient to return my call at their convenience.   Left direct callback number.

## 2023-12-14 ENCOUNTER — Ambulatory Visit: Payer: Self-pay

## 2023-12-14 NOTE — Telephone Encounter (Signed)
 FYI Only or Action Required?: FYI only for provider.  Patient was last seen in primary care on 12/04/2023 by Abbey Bruckner, MD.  Called Nurse Triage reporting Dysuria.  Symptoms began Ongoing - has been treated for UTI recently, now discomfort has returned.  Interventions attempted: Other: UC ABX.  Symptoms are: gradually worsening.  Triage Disposition: See Physician Within 24 Hours  Patient/caregiver understands and will follow disposition?: Yes - pt will go to UC.                       Copied from CRM [id number]. Topic: General - Other >> Dec 14, 2023  1:19 PM Suzen RAMAN wrote: Reason for CRM: painful urination Reason for Disposition  More than 2 UTI's in last year  Answer Assessment - Initial Assessment Questions 1. SEVERITY: How bad is the pain?  (e.g., Scale 1-10; mild, moderate, or severe)     Mild  2. FREQUENCY: How many times have you had painful urination today?      Each time 3. PATTERN: Is pain present every time you urinate or just sometimes?      yes 4. ONSET: When did the painful urination start?      Ongoing  - went away and then came back after finishing ABX 5. FEVER: Do you have a fever? If Yes, ask: What is your temperature, how was it measured, and when did it start?     no 6. PAST UTI: Have you had a urine infection before? If Yes, ask: When was the last time? and What happened that time?      yes 7. CAUSE: What do you think is causing the painful urination?  (e.g., UTI, scratch, Herpes sore)     UTI 8. OTHER SYMPTOMS: Do you have any other symptoms? (e.g., blood in urine, flank pain, genital sores, urgency, vaginal discharge)     no  Protocols used: Urination Pain - Female-A-AH

## 2023-12-15 NOTE — Telephone Encounter (Signed)
 Please have patient reach out to urology as well in adjunct to the triage recommendations. She was counseled to follow up with urology as she has a h/o neurogenic bladder and established with urology in the past.   Thank you,  Luke Shade, MD

## 2023-12-15 NOTE — Telephone Encounter (Signed)
 Called pt and she stated that she is going to UC today, and has an appointment with Nephrology the 18th of this month.

## 2023-12-19 ENCOUNTER — Other Ambulatory Visit: Payer: Self-pay

## 2023-12-22 ENCOUNTER — Ambulatory Visit

## 2023-12-22 VITALS — BP 124/80 | HR 94 | Temp 98.0°F | Ht 62.0 in | Wt 233.0 lb

## 2023-12-22 DIAGNOSIS — R209 Unspecified disturbances of skin sensation: Secondary | ICD-10-CM

## 2023-12-22 DIAGNOSIS — I10 Essential (primary) hypertension: Secondary | ICD-10-CM | POA: Diagnosis not present

## 2023-12-22 DIAGNOSIS — D649 Anemia, unspecified: Secondary | ICD-10-CM

## 2023-12-22 DIAGNOSIS — R300 Dysuria: Secondary | ICD-10-CM | POA: Diagnosis not present

## 2023-12-22 DIAGNOSIS — N1831 Chronic kidney disease, stage 3a: Secondary | ICD-10-CM | POA: Diagnosis not present

## 2023-12-22 DIAGNOSIS — M79606 Pain in leg, unspecified: Secondary | ICD-10-CM

## 2023-12-22 DIAGNOSIS — Z7985 Long-term (current) use of injectable non-insulin antidiabetic drugs: Secondary | ICD-10-CM

## 2023-12-22 DIAGNOSIS — E1121 Type 2 diabetes mellitus with diabetic nephropathy: Secondary | ICD-10-CM

## 2023-12-22 DIAGNOSIS — E782 Mixed hyperlipidemia: Secondary | ICD-10-CM | POA: Diagnosis not present

## 2023-12-22 DIAGNOSIS — N3941 Urge incontinence: Secondary | ICD-10-CM

## 2023-12-22 DIAGNOSIS — R269 Unspecified abnormalities of gait and mobility: Secondary | ICD-10-CM | POA: Diagnosis not present

## 2023-12-22 LAB — URINALYSIS, ROUTINE W REFLEX MICROSCOPIC
Bilirubin Urine: NEGATIVE
Ketones, ur: NEGATIVE
Nitrite: NEGATIVE
Specific Gravity, Urine: <=1.005 — AB (ref 1.000–1.030)
Total Protein, Urine: NEGATIVE
Urine Glucose: NEGATIVE
Urobilinogen, UA: 1 (ref 0.0–1.0)
pH: 6 (ref 5.0–8.0)

## 2023-12-22 LAB — CBC WITH DIFFERENTIAL/PLATELET
Basophils Absolute: 0 K/uL (ref 0.0–0.1)
Basophils Relative: 0.6 % (ref 0.0–3.0)
Eosinophils Absolute: 0.2 K/uL (ref 0.0–0.7)
Eosinophils Relative: 2.4 % (ref 0.0–5.0)
HCT: 33.7 % — ABNORMAL LOW (ref 36.0–46.0)
Hemoglobin: 11.3 g/dL — ABNORMAL LOW (ref 12.0–15.0)
Lymphocytes Relative: 27.3 % (ref 12.0–46.0)
Lymphs Abs: 2 K/uL (ref 0.7–4.0)
MCHC: 33.5 g/dL (ref 30.0–36.0)
MCV: 91.7 fl (ref 78.0–100.0)
Monocytes Absolute: 0.5 K/uL (ref 0.1–1.0)
Monocytes Relative: 6.6 % (ref 3.0–12.0)
Neutro Abs: 4.6 K/uL (ref 1.4–7.7)
Neutrophils Relative %: 63.1 % (ref 43.0–77.0)
Platelets: 219 K/uL (ref 150.0–400.0)
RBC: 3.67 Mil/uL — ABNORMAL LOW (ref 3.87–5.11)
RDW: 13.5 % (ref 11.5–15.5)
WBC: 7.3 K/uL (ref 4.0–10.5)

## 2023-12-22 MED ORDER — GEMTESA 75 MG PO TABS: 1.0000 | ORAL_TABLET | Freq: Every day | ORAL | 0 refills | Status: DC

## 2023-12-22 MED ORDER — CARVEDILOL 12.5 MG PO TABS
12.5000 mg | ORAL_TABLET | Freq: Two times a day (BID) | ORAL | 0 refills | Status: DC
Start: 2023-12-22 — End: 2024-01-03

## 2023-12-22 MED ORDER — VITAMIN B-12 100 MCG PO TABS: 100.0000 ug | ORAL_TABLET | Freq: Every day | ORAL | 3 refills | Status: AC

## 2023-12-22 NOTE — Assessment & Plan Note (Signed)
 B/L feet colder to touch compared to calves. She does state of pain on b/l feet when temperature gets cold. She has h/o neuropathy involving b/l feet. Dorsalis pedis b/l 2+ recommend vascular surgery referral for further evaluation. She will benefit from b/l duplex ultrasound of lower extremities. Vascular surgery referral made.

## 2023-12-22 NOTE — Assessment & Plan Note (Signed)
 Likely anemia of chronic disease.  B 12 on low normal side, recommend starting 1000 mcg b12 supplement daily.  Repeat CBC today.  Counseled on adding diet rich in iron, b12.

## 2023-12-22 NOTE — Assessment & Plan Note (Addendum)
 A1c within goal. Continue Ozempic  2mg /weekly dose, tolerating medication w/o side effects.  Significant improvement in renal function with Cr at baseline, improving GFR and resolution of hyponatremia.  She has an upcoming appointment with nephrologist Dr. Saralee Stank on 12/26/23 which she is counseled to keep.

## 2023-12-22 NOTE — Assessment & Plan Note (Signed)
 Chronic, has a h/o OAB treated with Vibegron  75 mg daily. She has seen urologist at [facility], Lauraine BROCKS, FNP) in the past but has not beeing following up with her. She ran out of prescription for Vibegron . Prescription for Vibegron  75 mg once a day (30 tab sent today). Repeat UA, urine culture. She is counseled to reach out to urology to schedule an appointment with urology and for further refill on Vibegron .

## 2023-12-22 NOTE — Progress Notes (Signed)
 Established Patient Office Visit   Subjective  Patient ID: Susan [name], female    DOB: [birth year]  Age: 63 y.o. MRN: [id number]  Chief Complaint  Patient presents with   Diabetes   Hypertension    She  has a past medical history of ADD (attention deficit disorder), Alcohol abuse, Anemia, Anxiety, Aortic atherosclerosis (HCC) (02/20/2018), Arthritis, Asthma, Bipolar disorder (HCC), Centrilobular emphysema (HCC), Chronic diarrhea (07/22/2022), Cocaine use disorder, moderate, in sustained remission (HCC), Constipation, Degenerative disc disease at L5-S1 level (09/28/2016), Depression, Diabetes mellitus, type 2 (HCC), Diverticulitis (2013), DOE (dyspnea on exertion), Drug use, Family history of adverse reaction to anesthesia, GERD (gastroesophageal reflux disease), H/O suicide attempt, Hepatitis C (06/26/2014), Hip pain, History of echocardiogram, History of MRSA infection (2013), HLD (hyperlipidemia), Hypertension, Hyponatremia, Hypothyroidism, Incisional hernia (11/08/2012), Knee pain, Left carotid bruit, Morbid obesity (HCC), Multinodular thyroid , OSA (obstructive sleep apnea), OSA on CPAP, Osteoporosis, Palpitations, Post-traumatic osteoarthritis of right knee (09/24/2015), Recurrent ventral hernia (11/08/2012), Skin lesion (06/28/2022), Status post total hip replacement, 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  HPI  Last OV with me: 12/04/23. She presents for f/u for UTI since her last visit.   - Recurrent UTI like symptoms:  She is established with urologist at [facility], Lauraine BROCKS, FNP) and is due for a f/u. She is on Vibegron  75 mg once a day. She was recommended to f/u with her urologist during her last office visit on 12/04/23 but she has not done so.  Urine culture from  12/04/23: grew Klebsiella pneumoniae sensitive to Augmentin  (treated with Augmentin  for 5 days).   Patient reports when she was taking Augmentin  her urinary symptoms fully resolved. Since stopping antibiotic she reports she started to have urinary urgency,  dysuria. No vaginal discharge. Denies worsening back pain from baseline. No fever, chills, nausea, vomiting, diarrhea.   - AKI on CKD with hyponatremia: Hyponatremia resolved.  Renal function improved with Cr of 1.35 (ar baseline now)  compared to Cr of 2.44 on 11/17/2023.  Nephrology referral made during last OV with me on 12/04/23. She has an upcoming appointment with nephrologist Dr. Saralee Stank on 12/26/23.   - Lab from 7/28:  B 12 at 344 Iron panel within  normal range CBC with hemoglobin on 10.2 HbA1c: 5.3%  Lipid panel with LDL 39    - Patient has seen cardiologist at [facility] Dr. Redell Cave, last OV was on 05/07/2021. She has not been taking Carvedilol  12.5 mg BID which she is not taking as she does no have prescription for it. She is on Telmisartan  80 mg once daily.   ROS As per HPI    Objective:     BP 124/80 (BP Location: Right Arm, Patient Position: Sitting, Cuff Size: Normal)   Pulse 94   Temp 98 F (36.7 C) (Oral)   Ht 5' 2 (1.575 m)   Wt 233 lb (105.7 kg)   BMI 42.62 kg/m      12/22/2023    8:13 AM 12/04/2023   11:16 AM 11/03/2023    1:14 PM  Depression screen PHQ 2/9  Decreased Interest 0 0 1  Down, Depressed, Hopeless 0 0 0  PHQ - 2 Score 0 0 1  Altered sleeping 0 0 2  Tired, decreased energy 0 0 1  Change in appetite 1 1 1   Feeling bad or failure about  yourself  0 0 0  Trouble concentrating 0 0 1  Moving slowly or fidgety/restless 0 0 0  Suicidal thoughts 0 0 0  PHQ-9 Score 1 1 6   Difficult doing work/chores  Somewhat difficult Somewhat difficult      12/22/2023    8:13 AM 12/04/2023   11:16 AM 11/03/2023    1:15 PM 10/09/2023   11:11 AM  GAD 7 : Generalized Anxiety Score  Nervous, Anxious, on  Edge 0 1 0   Control/stop worrying 0 0 0   Worry too much - different things 0 0 0   Trouble relaxing 0 0 0   Restless 0 0 0   Easily annoyed or irritable 0 0 1   Afraid - awful might happen 0 0 0   Total GAD 7 Score 0 1 1   Anxiety Difficulty Not difficult at all Somewhat difficult Somewhat difficult      Information is confidential and restricted. Go to Review Flowsheets to unlock data.      12/22/2023    8:13 AM 12/04/2023   11:16 AM 11/03/2023    1:14 PM  Depression screen PHQ 2/9  Decreased Interest 0 0 1  Down, Depressed, Hopeless 0 0 0  PHQ - 2 Score 0 0 1  Altered sleeping 0 0 2  Tired, decreased energy 0 0 1  Change in appetite 1 1 1   Feeling bad or failure about yourself  0 0 0  Trouble concentrating 0 0 1  Moving slowly or fidgety/restless 0 0 0  Suicidal thoughts 0 0 0  PHQ-9 Score 1 1 6   Difficult doing work/chores  Somewhat difficult Somewhat difficult      12/22/2023    8:13 AM 12/04/2023   11:16 AM 11/03/2023    1:15 PM 10/09/2023   11:11 AM  GAD 7 : Generalized Anxiety Score  Nervous, Anxious, on Edge 0 1 0   Control/stop worrying 0 0 0   Worry too much - different things 0 0 0   Trouble relaxing 0 0 0   Restless 0 0 0   Easily annoyed or irritable 0 0 1   Afraid - awful might happen 0 0 0   Total GAD 7 Score 0 1 1   Anxiety Difficulty Not difficult at all Somewhat difficult Somewhat difficult      Information is confidential and restricted. Go to Review Flowsheets to unlock data.   SDOH Screenings   Food Insecurity: No Food Insecurity (03/29/2023)  Housing: Patient Declined (03/29/2023)  Transportation Needs: No Transportation Needs (03/29/2023)  Utilities: Not At Risk (03/29/2023)  Alcohol Screen: Low Risk  (03/29/2023)  Depression (PHQ2-9): Low Risk  (12/22/2023)  Recent Concern: Depression (PHQ2-9) - Medium Risk (11/03/2023)  Financial Resource Strain: Low Risk  (03/29/2023)  Physical Activity: Inactive (03/29/2023)  Social Connections:  Moderately Isolated (03/29/2023)  Stress: No Stress Concern Present (03/29/2023)  Tobacco Use: Medium Risk (12/22/2023)  Health Literacy: Adequate Health Literacy (03/29/2023)     Physical Exam Constitutional:      Appearance: She is obese.  HENT:     Head: Normocephalic and atraumatic.     Mouth/Throat:     Mouth: Mucous membranes are moist.  Cardiovascular:     Rate and Rhythm: Normal rate.  Abdominal:     General: Abdomen is protuberant. Bowel sounds are normal.     Palpations: Abdomen is soft.     Tenderness: There is no abdominal tenderness. There is no right CVA tenderness or left CVA  tenderness.  Musculoskeletal:     Right lower leg: No edema.     Left lower leg: No edema.     Comments: B/L feet colder to touch compared to calves. Dorsalis pedis b/l 2+   Lymphadenopathy:     Cervical: No cervical adenopathy.  Neurological:     Mental Status: She is alert and oriented to person, place, and time.     Gait: Gait abnormal (slow careful gait, using walker for ambulation).        No results found for any visits on 12/22/23.  The ASCVD Risk score (Arnett DK, et al., 2019) failed to calculate for the following reasons:   Risk score cannot be calculated because patient has a medical history suggesting prior/existing ASCVD     Assessment & Plan:   Dysuria -     Urinalysis, Routine w reflex microscopic -     Urine Culture  Urge incontinence Assessment & Plan: Chronic, has a h/o OAB treated with Vibegron  75 mg daily. She has seen urologist at [facility], Lauraine BROCKS, FNP) in the past but has not beeing following up with her. She ran out of prescription for Vibegron . Prescription for Vibegron  75 mg once a day (30 tab sent today). Repeat UA, urine culture. She is counseled to reach out to urology to schedule an appointment with urology and for further refill on Vibegron .   Orders: -     Gemtesa ; Take 1 tablet (75 mg total) by mouth daily.  Dispense: 30 tablet;  Refill: 0  Normocytic normochromic anemia Assessment & Plan: Likely anemia of chronic disease.  B 12 on low normal side, recommend starting 1000 mcg b12 supplement daily.  Repeat CBC today.  Counseled on adding diet rich in iron, b12.   Orders: -     Vitamin B-12; Take 1 tablet (100 mcg total) by mouth daily.  Dispense: 90 tablet; Refill: 3 -     CBC with Differential/Platelet  Painful and cold lower extremity Assessment & Plan: B/L feet colder to touch compared to calves. She does state of pain on b/l feet when temperature gets cold. She has h/o neuropathy involving b/l feet. Dorsalis pedis b/l 2+ recommend vascular surgery referral for further evaluation. She will benefit from b/l duplex ultrasound of lower extremities. Vascular surgery referral made.   Orders: -     Ambulatory referral to Vascular Surgery  Type II diabetes mellitus with nephropathy (HCC) Assessment & Plan: A1c within goal. Continue Ozempic  2mg /weekly dose, tolerating medication w/o side effects.  Significant improvement in renal function with Cr at baseline, improving GFR and resolution of hyponatremia.  She has an upcoming appointment with nephrologist Dr. Saralee Stank on 12/26/23 which she is counseled to keep.     Stage 3a chronic kidney disease [facility]) Assessment & Plan: Renal function at baseline, has upcoming appointment with nephrologist on 12/26/23. Avoid nephrotoxic medications.    Abnormality of gait and mobility Assessment & Plan: Using walker for mobility, has physical therapy scheduled for this week. Continue PT, f/u with neurosurgery.    Primary hypertension Assessment & Plan: Goal BP <130/80 mmHg.  She is currently not taking Carvedilol  12.5 mg BID as she ran out of medication. Recommend restarting this, new refill sent.  Currently on Telmisartan  80 mg once daily, continue.  Recommend reaching out to [facility] cardiology to reschedule an appointment with  Dr. Redell Cave as she has seen him  in the past.    Orders: -     Carvedilol ; Take  1 tablet (12.5 mg total) by mouth 2 (two) times daily.  Dispense: 180 tablet; Refill: 0  Mixed hyperlipidemia Assessment & Plan: LDL good control, continue Crestor  20 mg daily.      Return in about 3 months (around 03/23/2024) for Diabetes, recurrent urinary infection.   Luke Shade, MD

## 2023-12-22 NOTE — Assessment & Plan Note (Signed)
 Using walker for mobility, has physical therapy scheduled for this week. Continue PT, f/u with neurosurgery.

## 2023-12-22 NOTE — Assessment & Plan Note (Signed)
 LDL good control, continue Crestor  20 mg daily.

## 2023-12-22 NOTE — Assessment & Plan Note (Signed)
 Goal BP <130/80 mmHg.  She is currently not taking Carvedilol  12.5 mg BID as she ran out of medication. Recommend restarting this, new refill sent.  Currently on Telmisartan  80 mg once daily, continue.  Recommend reaching out to [facility] cardiology to reschedule an appointment with  Dr. Redell Cave as she has seen him in the past.

## 2023-12-22 NOTE — Patient Instructions (Addendum)
 Please call Susan Lauraine BROCKS, FNP urology to schedule an appointment with her.   Please call cardiology clinic to schedule an appointment with Dr. Redell Cave. If you need a new referral to see cardiology please let me know.   Vitamin B12-rich foods: Lean meats, poultry, fish, eggs, and dairy products.   Iron-rich foods: Lean red meats, poultry, fish, and eggs provide,  while plant sources (beans, lentils, spinach).

## 2023-12-22 NOTE — Assessment & Plan Note (Signed)
 Renal function at baseline, has upcoming appointment with nephrologist on 12/26/23. Avoid nephrotoxic medications.

## 2023-12-24 LAB — URINE CULTURE
MICRO NUMBER:: 16838216
SPECIMEN QUALITY:: ADEQUATE

## 2023-12-25 ENCOUNTER — Ambulatory Visit: Payer: Self-pay

## 2023-12-25 ENCOUNTER — Ambulatory Visit: Admitting: Nurse Practitioner

## 2023-12-25 DIAGNOSIS — N3000 Acute cystitis without hematuria: Secondary | ICD-10-CM

## 2023-12-25 MED ORDER — AMOXICILLIN-POT CLAVULANATE 875-125 MG PO TABS: 1.0000 | ORAL_TABLET | Freq: Two times a day (BID) | ORAL | 0 refills | Status: AC

## 2023-12-25 NOTE — Progress Notes (Signed)
 Please let the patient know her urine culture came back positive for the same bacteria that caused urinary tract infection in 12/04/2023 called Klebsiella pneumonia.  I recommend we treat urinary tract infection with Augmentin  twice a day for 7 days.  I have sent the prescription to the pharmacy.  She should take daily over-the-counter probiotic to help reduce antibiotic side effect like diarrhea, vaginal yeast infection.  As discussed during her office visit on 12/22/2023 she needs to follow-up with urology to further evaluate into recurrent UTI.  Her CBC shows improved hemoglobin, hematocrit. I will continue to monitor this periodically.   Thank you,  Luke Shade, MD

## 2023-12-27 ENCOUNTER — Ambulatory Visit (INDEPENDENT_AMBULATORY_CARE_PROVIDER_SITE_OTHER): Admitting: Nurse Practitioner

## 2023-12-27 ENCOUNTER — Encounter: Payer: Self-pay | Admitting: Nurse Practitioner

## 2023-12-27 VITALS — BP 142/74 | HR 62 | Temp 98.1°F | Ht 62.0 in | Wt 237.2 lb

## 2023-12-27 DIAGNOSIS — J432 Centrilobular emphysema: Secondary | ICD-10-CM

## 2023-12-27 DIAGNOSIS — G4733 Obstructive sleep apnea (adult) (pediatric): Secondary | ICD-10-CM

## 2023-12-27 DIAGNOSIS — I639 Cerebral infarction, unspecified: Secondary | ICD-10-CM

## 2023-12-27 NOTE — Patient Instructions (Signed)
 Continue Albuterol  inhaler 2 puffs every 6 hours as needed for shortness of breath or wheezing. Notify if symptoms persist despite rescue inhaler/neb use.   Continue to use CPAP every night, minimum of 4-6 hours a night.  Change equipment as directed. Wash your tubing with warm soap and water  daily, hang to dry. Wash humidifier portion weekly. Use bottled, distilled water  and change daily Be aware of reduced alertness and do not drive or operate heavy machinery if experiencing this or drowsiness.  Exercise encouraged, as tolerated. Healthy weight management discussed.  Avoid or decrease alcohol consumption and medications that make you more sleepy, if possible. Notify if persistent daytime sleepiness occurs even with consistent use of PAP therapy.  Change CPAP supplies... Every month Mask cushions and/or nasal pillows CPAP machine filters Every 3 months Mask frame (not including the headgear) CPAP tubing Every 6 months Mask headgear Chin strap (if applicable) Humidifier water  tub  Follow up in one year with Susan Jaeli Grubb,NP. If symptoms do not improve or worsen, please contact office for sooner follow up or seek emergency care.

## 2023-12-27 NOTE — Assessment & Plan Note (Signed)
 Severe OSA on CPAP.  Excellent control and compliance.  Receives benefit from use.  Aware of risks of untreated sleep apnea and proper care/use of device.  Healthy weight management encouraged.  Safe driving practices reviewed.  Patient Instructions  Continue Albuterol  inhaler 2 puffs every 6 hours as needed for shortness of breath or wheezing. Notify if symptoms persist despite rescue inhaler/neb use.   Continue to use CPAP every night, minimum of 4-6 hours a night.  Change equipment as directed. Wash your tubing with warm soap and water  daily, hang to dry. Wash humidifier portion weekly. Use bottled, distilled water  and change daily Be aware of reduced alertness and do not drive or operate heavy machinery if experiencing this or drowsiness.  Exercise encouraged, as tolerated. Healthy weight management discussed.  Avoid or decrease alcohol consumption and medications that make you more sleepy, if possible. Notify if persistent daytime sleepiness occurs even with consistent use of PAP therapy.  Change CPAP supplies... Every month Mask cushions and/or nasal pillows CPAP machine filters Every 3 months Mask frame (not including the headgear) CPAP tubing Every 6 months Mask headgear Chin strap (if applicable) Humidifier water  tub  Follow up in one year with Katie Montford Barg,NP. If symptoms do not improve or worsen, please contact office for sooner follow up or seek emergency care.

## 2023-12-27 NOTE — Assessment & Plan Note (Signed)
 Emphysema with moderate COPD.  No controller therapy.  No recent exacerbations or hospitalizations.  Minimal symptom burden.  Continue as needed [place].  Action plan in place.  Smoking cessation advised.

## 2023-12-27 NOTE — Progress Notes (Signed)
 @Patient  ID: Susan [name] Drafts, female    DOB: Jul 17, 1960, 63 y.o.   MRN: [id number]  Chief Complaint  Patient presents with   Obstructive Sleep Apnea    Wearing CPAP every night. No problems mask or pressure.    Referring provider: Abbey Bruckner, MD  HPI: 63 year old female, active smoker followed for OSA on CPAP and emphysema. She is a former patient of Dr. Magdaleno and last seen in office 03/03/2022. Past medical history significant for HTN, CVA, allergic rhinitis, GERD, DM, thyroid  goiter, CKD, PTSD, obesity, HLD, hx of cocaine abuse, bipolar, anxiety, hx of ETOH use.   TEST/EVENTS:  11/04/2014 PSG: AHI 72.9/h, SpO2 low 80% 02/14/2019 PFT: FVC 97, FEV1 96, ratio 77, TLC 105, DLCO 80 07/08/2021 PFT: FVC 94, FEV1 73, ratio 42, TLC 100, DLCO 72. Moderate COPD 10/17/2022 CTA chest: negative PE. Enlarged heart. Stable thyroid  goiter. Lungs are clear.   03/03/2022: OV with Dr. Shellia. OSA and COPD. Tried Trelegy with no difference. Uses albuterol  intermittently, which helps. Down to 1/2 ppd. Planning to use nicotine  patch. Uses CPAP nightly. FFM. CPAP 14 cmH2O. Mild symptoms. PRN albuterol . Continue lung cancer screening program.   12/27/2023: Today - follow up Patient presents today for overdue follow-up.  Has been doing very well with her CPAP.  Wears it every single night.  Cannot sleep without it.  Feels well rested with use.  Energy levels are decent during the day.  No issues with drowsy driving or morning headaches.  She had a stroke about a year ago and her primary just wanted her to reestablish care for monitoring.  She does follow with a cardiologist and neurologist.  Has recovered well poststroke. She is still smoking daily, around half pack a day.  Breathing has been doing well.  No issues with cough or wheezing.  Has not used her rescue inhaler in about a year.  Not on any maintenance therapy.  No exacerbations requiring steroids or antibiotics.  Allergies  Allergen Reactions    Wellbutrin  [Bupropion ]     Patient reports made  deathly sick  years ago at appointment 03/18/20.   Furosemide Other (See Comments)    Electrolyte imbalance  Other Reaction(s): Other (See Comments)  Electrolyte imbalance Electrolyte imbalance Electrolyte imbalance    Electrolyte imbalance   Hydrochlorothiazide  Other (See Comments)    Electrolyte imbalance  Other Reaction(s): Dizziness, Other (See Comments)    Immunization History  Administered Date(s) Administered   Hep A / Hep B 11/16/2009, 08/25/2010, 01/27/2011   Influenza Inj Mdck Quad Pf 02/13/2019   Influenza, Seasonal, Injecte, Preservative Fre 03/28/2023   Influenza,inj,Quad PF,6+ Mos 04/09/2015, 01/10/2018, 04/08/2019, 01/27/2020, 02/03/2021, 03/03/2022   Influenza-Unspecified 05/26/2014, 03/30/2016   MMR 07/17/2000   PFIZER Comirnaty(Gray Top)Covid-19 Tri-Sucrose Vaccine 07/01/2020   PFIZER(Purple Top)SARS-COV-2 Vaccination 08/31/2019, 09/24/2019   PNEUMOCOCCAL CONJUGATE-20 03/24/2021   Pfizer Covid-19 Vaccine Bivalent Booster 12yrs & up 07/01/2020   Pneumococcal Polysaccharide-23 04/09/2015   Tdap 03/09/2012    Past Medical History:  Diagnosis Date   ADD (attention deficit disorder)    Alcohol abuse    Anemia    Anxiety    Aortic atherosclerosis (HCC) 02/20/2018   Chest CT Sept 2019   Arthritis    rheumatoid arthritis   Asthma    Bipolar disorder (HCC)    Centrilobular emphysema (HCC)    Chronic diarrhea 07/22/2022   Cocaine use disorder, moderate, in sustained remission (HCC)    Constipation    Degenerative disc disease at L5-S1 level 09/28/2016  See ortho note May 2018   Depression    bipolar, hx of suicide attempt   Diabetes mellitus, type 2 (HCC)    Diverticulitis 2013   DOE (dyspnea on exertion)    Drug use    Family history of adverse reaction to anesthesia    mom-delayed emergence   GERD (gastroesophageal reflux disease)    H/O suicide attempt    slit wrists   Hepatitis C 06/26/2014    treated with Harvoni   Hip pain    History of echocardiogram    a. 04/2019 Echo: EF >65%, nl RV fxn.   History of MRSA infection 2013   HLD (hyperlipidemia)    Hypertension    a. 06/2021 Renal Duplex: ? bilat RAS; b. 06/2021 CTA Abd: No signif RAS.   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   Status post total right knee replacement using cement 05/10/2016   Stroke (HCC) 07/05/2022   right arm,torso and right leg numbness   Tobacco use disorder    Tobacco use disorder, continuous 06/26/2014   Vitamin B12 deficiency    Vitamin D  deficiency 04/09/2015    Tobacco History: Social History   Tobacco Use  Smoking Status Every Day   Current packs/day: 0.50   Average packs/day: 0.5 packs/day for 48.6 years (24.3 ttl pk-yrs)   Types: Cigarettes   Start date: 1977  Smokeless Tobacco Never   Ready to quit: Not Answered Counseling given: Not Answered   Outpatient Medications Prior to Visit  Medication Sig Dispense Refill   acetaminophen  (TYLENOL ) 500 MG tablet Take 1,000 mg by mouth 2 (two) times daily.     amoxicillin -clavulanate (AUGMENTIN ) 875-125 MG tablet Take 1 tablet by mouth 2 (two) times daily for 7 days. 14 tablet 0   aspirin  EC 81 MG tablet Take by mouth.     carvedilol  (COREG ) 12.5 MG tablet Take 1 tablet (12.5 mg total) by mouth 2 (two) times daily. 180 tablet 0   clotrimazole  (LOTRIMIN ) 1 % cream Apply 1 application  topically 2 (two) times daily as needed.     cyclobenzaprine  (FLEXERIL ) 5 MG tablet Take 5 mg by mouth 2 (two) times daily.     DULoxetine  (CYMBALTA ) 20 MG capsule TAKE ONE CAPSULE BY MOUTH DAILY  *TAKE WITH 30 MG CAPSULE 30 capsule 10   DULoxetine  (CYMBALTA ) 30 MG capsule TAKE 1 CAPSULE BY MOUTH DAILY *TAKE WITH 20MG  CAPSULE 30 capsule 11   gabapentin  (NEURONTIN ) 300 MG capsule Take 600 mg by mouth 2 (two) times daily.     HYDROcodone -acetaminophen  (NORCO/VICODIN) 5-325 MG tablet Take 1 tablet by mouth daily as needed for moderate pain.     lurasidone  (LATUDA ) 20 MG TABS tablet TAKE 1 TABLET(20 MG) BY MOUTH DAILY WITH SUPPER 30 tablet 3   naloxone (NARCAN) nasal spray 4 mg/0.1 mL Place into the nose.     rosuvastatin  (CRESTOR ) 20 MG tablet Take 1 tablet (20 mg total) by mouth daily. 90 tablet 3   Semaglutide , 2 MG/DOSE, (OZEMPIC , 2 MG/DOSE,) 8 MG/3ML SOPN Inject 2 mg into the skin  once a week. 3 mL 3   telmisartan  (MICARDIS ) 80 MG tablet Take 80 mg by mouth daily.     traZODone  (DESYREL ) 50 MG tablet Take 1 tablet (50 mg total) by mouth at bedtime as needed for sleep. 30 tablet 2   Vibegron  (GEMTESA ) 75 MG TABS Take 1 tablet (75 mg total) by mouth daily. 30 tablet 0   vitamin B-12 (CYANOCOBALAMIN ) 100 MCG tablet Take 1 tablet (100 mcg total) by mouth daily. 90 tablet 3   PERIOGARD  0.12 % solution Use as directed 10 mLs in the mouth or throat 3 (three) times daily as needed.     No facility-administered medications prior to visit.     Review of Systems:   Constitutional: No weight loss or gain, night sweats, fevers, chills, fatigue, or lassitude. HEENT: No headaches. No sneezing, itching, ear ache, nasal congestion, or post nasal drip Resp: +minimal shortness of breath with exertion. No excess mucus or change in color of mucus. No productive or non-productive. No hemoptysis. No wheezing.  No chest wall deformity GI:  No heartburn, indigestion Neuro: No dizziness or lightheadedness.  Psych: No depression or anxiety. Mood stable.     Physical Exam:  BP (!) 160/106   Pulse 62   Temp 98.1 F (36.7 C) (Oral)   Ht 5' 2 (1.575 m)   Wt 237 lb 3.2 oz (107.6 kg)   SpO2 98%   BMI  43.38 kg/m   GEN: Pleasant, interactive, well-appearing; morbidly obese; in no acute distress HEENT:  Normocephalic and atraumatic. PERRLA. Sclera white. Nasal turbinates pink, moist and patent bilaterally. No rhinorrhea present. Oropharynx pink and moist, without exudate or edema. No lesions, ulcerations, or postnasal drip. Mallampati III NECK:  Supple w/ fair ROM. No JVD present. No lymphadenopathy.   CV: RRR, no m/r/g, no peripheral edema. Pulses intact, +2 bilaterally. No cyanosis, pallor or clubbing. PULMONARY:  Unlabored, regular breathing. Clear bilaterally A&P w/o wheezes/rales/rhonchi.  GI: BS present and normoactive. Soft, non-tender to palpation. No organomegaly or masses detected.  MSK: No erythema, warmth or tenderness. Cap refil <2 sec all extrem.  Neuro: A/Ox3. No focal deficits noted.   Skin: Warm, no lesions or rashe Psych: Normal affect and behavior. Judgement and thought content appropriate.     Lab Results:  CBC    Component Value Date/Time   WBC 7.3 12/22/2023 0851   RBC 3.67 (L) 12/22/2023 0851   HGB 11.3 (L) 12/22/2023 0851   HGB 13.2 05/28/2020 0903   HCT 33.7 (L) 12/22/2023 0851   HCT 38.7 05/28/2020 0903   PLT 219.0 12/22/2023 0851   PLT 255 05/28/2020 0903   MCV 91.7 12/22/2023 0851   MCV 93 05/28/2020 0903   MCV 91 04/29/2014 1133   MCH 31.3 12/04/2023 1318   MCHC 33.5 12/22/2023 0851   RDW 13.5 12/22/2023 0851   RDW 12.2 05/28/2020 0903   RDW 13.1 04/29/2014 1133   LYMPHSABS 2.0 12/22/2023 0851   LYMPHSABS 3.3 (H) 05/28/2020 0903   LYMPHSABS 2.3 11/20/2012 1005   MONOABS 0.5 12/22/2023 0851   MONOABS 0.5 11/20/2012 1005   EOSABS 0.2 12/22/2023 0851   EOSABS 0.3 05/28/2020 0903   EOSABS 0.2 11/20/2012 1005   BASOSABS 0.0 12/22/2023 0851   BASOSABS 0.1 05/28/2020 0903   BASOSABS 0.1 11/20/2012 1005    BMET    Component Value Date/Time   NA 137 12/04/2023 1318   NA 137 05/28/2020 0903   NA 136 04/29/2014 1133   K 4.3 12/04/2023 1318  K 4.2 04/29/2014 1133   CL 104 12/04/2023 1318   CL 103 04/29/2014 1133   CO2 20 12/04/2023 1318   CO2 30 04/29/2014 1133   GLUCOSE 81 12/04/2023 1318   GLUCOSE 85 04/29/2014 1133   BUN 10 12/04/2023 1318   BUN 9 05/28/2020 0903   BUN 9 04/29/2014 1133   CREATININE 1.35 (H) 12/04/2023 1318   CALCIUM  8.8 12/04/2023 1318   CALCIUM  9.1 04/29/2014 1133   GFRNONAA 22 (L) 11/17/2023 1026   GFRNONAA 69 10/16/2019 1139   GFRAA 64 05/28/2020 0903   GFRAA 80 10/16/2019 1139    BNP    Component Value Date/Time   BNP 95.9 10/13/2022 0155   BNP 24 10/16/2019 1139     Imaging:  No results found.  Administration History     None          Latest Ref Rng & Units 07/08/2021    2:03 PM  PFT Results  FVC-Pre L 2.91   FVC-Predicted Pre % 94   FVC-Post L 2.94   FVC-Predicted Post % 95   Pre FEV1/FVC % % 60   Post FEV1/FCV % % 42   FEV1-Pre L 1.74   FEV1-Predicted Pre % 73   FEV1-Post L 1.24   DLCO uncorrected ml/min/mmHg 13.60   DLCO UNC% % 72   DLVA Predicted % 70   TLC L 4.78   TLC % Predicted % 100   RV % Predicted % 89     No results found for: NITRICOXIDE      Assessment & Plan:   OSA on CPAP Severe OSA on CPAP.  Excellent control and compliance.  Receives benefit from use.  Aware of risks of untreated sleep apnea and proper care/use of device.  Healthy weight management encouraged.  Safe driving practices reviewed.  Patient Instructions  Continue Albuterol  inhaler 2 puffs every 6 hours as needed for shortness of breath or wheezing. Notify if symptoms persist despite rescue inhaler/neb use.   Continue to use CPAP every night, minimum of 4-6 hours a night.  Change equipment as directed. Wash your tubing with warm soap and water  daily, hang to dry. Wash humidifier portion weekly. Use bottled, distilled water  and change daily Be aware of reduced alertness and do not drive or operate heavy machinery if experiencing this or drowsiness.  Exercise encouraged, as  tolerated. Healthy weight management discussed.  Avoid or decrease alcohol consumption and medications that make you more sleepy, if possible. Notify if persistent daytime sleepiness occurs even with consistent use of PAP therapy.  Change CPAP supplies... Every month Mask cushions and/or nasal pillows CPAP machine filters Every 3 months Mask frame (not including the headgear) CPAP tubing Every 6 months Mask headgear Chin strap (if applicable) Humidifier water  tub  Follow up in one year with Katie Dhriti Fales,NP. If symptoms do not improve or worsen, please contact office for sooner follow up or seek emergency care.    Centrilobular emphysema (HCC) Emphysema with moderate COPD.  No controller therapy.  No recent exacerbations or hospitalizations.  Minimal symptom burden.  Continue as needed [place].  Action plan in place.  Smoking cessation advised.  CVA (cerebrovascular accident) [facility]) History of CVA with some residual generalized weakness.  Recovering well.  Follow-up with neurology as scheduled.   Advised if symptoms do not improve or worsen, to please contact office for sooner follow up or seek emergency care.   I spent 25 minutes of dedicated to the care of this patient on the date  of this encounter to include pre-visit review of records, face-to-face time with the patient discussing conditions above, post visit ordering of testing, clinical documentation with the electronic health record, making appropriate referrals as documented, and communicating necessary findings to members of the patients care team.  Comer LULLA Rouleau, NP 12/27/2023  Pt aware and understands NP's role.

## 2023-12-27 NOTE — Assessment & Plan Note (Signed)
 History of CVA with some residual generalized weakness.  Recovering well.  Follow-up with neurology as scheduled.

## 2023-12-28 ENCOUNTER — Other Ambulatory Visit: Payer: Self-pay

## 2024-01-01 ENCOUNTER — Encounter: Payer: Self-pay | Admitting: Physical Therapy

## 2024-01-01 ENCOUNTER — Ambulatory Visit: Attending: Neurosurgery | Admitting: Physical Therapy

## 2024-01-01 DIAGNOSIS — R262 Difficulty in walking, not elsewhere classified: Secondary | ICD-10-CM | POA: Diagnosis not present

## 2024-01-01 DIAGNOSIS — M6281 Muscle weakness (generalized): Secondary | ICD-10-CM | POA: Diagnosis not present

## 2024-01-01 DIAGNOSIS — M545 Low back pain, unspecified: Secondary | ICD-10-CM | POA: Diagnosis not present

## 2024-01-01 DIAGNOSIS — R2689 Other abnormalities of gait and mobility: Secondary | ICD-10-CM | POA: Diagnosis not present

## 2024-01-01 DIAGNOSIS — M48062 Spinal stenosis, lumbar region with neurogenic claudication: Secondary | ICD-10-CM | POA: Diagnosis not present

## 2024-01-01 DIAGNOSIS — M5126 Other intervertebral disc displacement, lumbar region: Secondary | ICD-10-CM | POA: Diagnosis not present

## 2024-01-01 DIAGNOSIS — M5459 Other low back pain: Secondary | ICD-10-CM | POA: Insufficient documentation

## 2024-01-01 DIAGNOSIS — M5416 Radiculopathy, lumbar region: Secondary | ICD-10-CM | POA: Diagnosis not present

## 2024-01-01 DIAGNOSIS — G8929 Other chronic pain: Secondary | ICD-10-CM | POA: Diagnosis not present

## 2024-01-01 NOTE — Therapy (Signed)
 OUTPATIENT PHYSICAL THERAPY THORACOLUMBAR AND BALANCE  EVALUATION   Patient Name: Susan [name] MRN: [id number] DOB:03-02-1961, 63 y.o., female Today's Date: 01/01/2024  END OF SESSION:  PT End of Session - 01/01/24 1026     Visit Number 1    Number of Visits 24    Date for PT Re-Evaluation 03/25/24    Authorization Type [employer] Medicare Dual 2025    Authorization - Visit Number 1    Authorization - Number of Visits 24    Progress Note Due on Visit 10    PT Start Time 0900    PT Stop Time 0945    PT Time Calculation (min) 45 min    Activity Tolerance Patient tolerated treatment well    Behavior During Therapy [facility] for tasks assessed/performed          Past Medical History:  Diagnosis Date   ADD (attention deficit disorder)    Alcohol abuse    Anemia    Anxiety    Aortic atherosclerosis (HCC) 02/20/2018   Chest CT Sept 2019   Arthritis    rheumatoid arthritis   Asthma    Bipolar disorder (HCC)    Centrilobular emphysema (HCC)    Chronic diarrhea 07/22/2022   Cocaine use disorder, moderate, in sustained remission (HCC)    Constipation    Degenerative disc disease at L5-S1 level 09/28/2016   See ortho note May 2018   Depression    bipolar, hx of suicide attempt   Diabetes mellitus, type 2 (HCC)    Diverticulitis 2013   DOE (dyspnea on exertion)    Drug use    Family history of adverse reaction to anesthesia    mom-delayed emergence   GERD (gastroesophageal reflux disease)    H/O suicide attempt    slit wrists   Hepatitis C 06/26/2014   treated with Harvoni   Hip pain    History of echocardiogram    a. 04/2019 Echo: EF >65%, nl RV fxn.   History of MRSA infection 2013   HLD (hyperlipidemia)    Hypertension    a. 06/2021 Renal Duplex: ? bilat RAS; b. 06/2021 CTA Abd: No signif RAS.   Hyponatremia    Hypothyroidism    Incisional hernia 11/08/2012   Knee pain    Left carotid bruit    Morbid obesity (HCC)    Multinodular thyroid     OSA (obstructive sleep  apnea)    OSA on CPAP    Osteoporosis    Palpitations    Post-traumatic osteoarthritis of right knee 09/24/2015   Recurrent ventral hernia 11/08/2012   Skin lesion 06/28/2022   Status post total hip replacement, left 07/23/2019   Status post total right knee replacement using cement 05/10/2016   Stroke (HCC) 07/05/2022   right arm,torso and right leg numbness   Tobacco use disorder    Tobacco use disorder, continuous 06/26/2014   Vitamin B12 deficiency    Vitamin D  deficiency 04/09/2015   Past Surgical History:  Procedure Laterality Date   BILATERAL SALPINGOOPHORECTOMY     due to abnormal mass   BREAST BIOPSY Left    neg   BREAST SURGERY Left 20 yrs ago   CESAREAN SECTION     COLONOSCOPY     COLONOSCOPY WITH PROPOFOL  N/A 10/16/2017   Procedure: COLONOSCOPY WITH PROPOFOL ;  Surgeon: Therisa Bi, MD;  Location: [facility] ENDOSCOPY;  Service: Gastroenterology;  Laterality: N/A;   COLONOSCOPY WITH PROPOFOL  N/A 02/16/2021   Procedure: COLONOSCOPY WITH PROPOFOL ;  Surgeon: Therisa Bi, MD;  Location: [facility] ENDOSCOPY;  Service: Gastroenterology;  Laterality: N/A;   FORAMINOTOMY 1 LEVEL Right 10/10/2022   Procedure: RIGHT L4-5 LAMINOFORAMINOTOMY;  Surgeon: Clois Fret, MD;  Location: [facility] ORS;  Service: Neurosurgery;  Laterality: Right;   HERNIA REPAIR  06/2011, July 2014   Ventral wall repair with Physiomesh   HERNIA REPAIR     2nd.vental wall repair   JOINT REPLACEMENT Right    knee   LUMBAR LAMINECTOMY/ DECOMPRESSION WITH MET-RX N/A 10/12/2022   Procedure: LUMBAR LAMINECTOMY/ DECOMPRESSION WITH MET-RX;  Surgeon: Clois Fret, MD;  Location: [facility] ORS;  Service: Neurosurgery;  Laterality: N/A;   TONSILLECTOMY     TOTAL HIP ARTHROPLASTY Left 07/23/2019   Procedure: TOTAL HIP ARTHROPLASTY;  Surgeon: Edie Norleen PARAS, MD;  Location: [facility] ORS;  Service: Orthopedics;  Laterality: Left;   TOTAL KNEE ARTHROPLASTY Right 05/10/2016   Procedure: TOTAL KNEE ARTHROPLASTY;  Surgeon: Norleen PARAS Edie,  MD;  Location: [facility] ORS;  Service: Orthopedics;  Laterality: Right;   TUBAL LIGATION     Patient Active Problem List   Diagnosis Date Noted   Urge incontinence 12/22/2023   Painful and cold lower extremity 12/22/2023   Hemiplegia and hemiparesis following cerebral infarction affecting right dominant side (HCC) 12/04/2023   CKD (chronic kidney disease) stage 3, GFR 30-59 ml/min (HCC) 12/04/2023   Dysuria 11/03/2023   History of stroke 04/17/2023   Right leg weakness 04/17/2023   Neurogenic bladder 02/13/2023   Abnormality of gait and mobility 02/13/2023   Morbid obesity with body mass index (BMI) of 40.0 to 44.9 in adult (HCC) 01/10/2023   Left thyroid  nodule 01/10/2023   Bipolar 1 disorder, depressed, moderate (HCC) 11/29/2022   Normocytic normochromic anemia 10/13/2022   Epidural hematoma (HCC) 10/12/2022   Mass in epidural space 10/12/2022   Synovial cyst of lumbar facet joint 10/10/2022   Lumbar radiculopathy 10/10/2022   S/P laminectomy 10/10/2022   Epigastric pain 07/22/2022   Enlarged thyroid  07/22/2022   CVA (cerebrovascular accident) (HCC) 07/05/2022   Type II diabetes mellitus with nephropathy (HCC) 06/16/2021   H/O thyroid  nodule 05/31/2021   History of hepatitis C- treated 2014- 2015 with [facility] gastroenterology and followed.  05/19/2021   Left carotid bruit 04/28/2021   Bipolar disorder, in full remission, most recent episode depressed (HCC) 02/05/2021   Anxiety 05/28/2020   Bipolar affective disorder in remission (HCC) 05/28/2020   Bipolar disorder, in full remission, most recent episode mixed (HCC) 03/06/2020   Need for immunization against influenza 01/27/2020   Morbid obesity (HCC) 01/27/2020   Osteoporosis 11/06/2019   Bipolar 1 disorder, mixed, mild (HCC) 10/29/2019   PTSD (post-traumatic stress disorder) 09/27/2019   Bereavement 09/27/2019   Cocaine use disorder, moderate, in sustained remission (HCC) 09/27/2019   Primary osteoarthritis of left hip 06/14/2019    Allergic rhinitis 08/23/2018   Aortic atherosclerosis (HCC) 02/20/2018   Centrilobular emphysema (HCC) 02/20/2018   Hyperlipidemia 03/28/2017   Degenerative disc disease at L5-S1 level 09/28/2016   GERD (gastroesophageal reflux disease) 12/17/2015   Chronic back pain 10/09/2015   Post-traumatic osteoarthritis of right knee 09/24/2015   Alcohol use disorder, severe, in sustained remission (HCC) 04/13/2015   OSA on CPAP 04/09/2015   Subclinical hyperthyroidism 06/26/2014   Goiter colloid, toxic, nodular 06/26/2014   Hypertension 06/26/2014   Bipolar I disorder (HCC) 06/26/2014   Lumbar radiculitis 10/03/2013   Diverticulosis 08/24/2013    PCP: Dr. Luke Shade     REFERRING PROVIDER: Dr. Fret Clois    REFERRING DIAG: M54.50,G89.29 (ICD-10-CM) -  Chronic bilateral low back pain without sciatica  Rationale for Evaluation and Treatment: Rehabilitation  THERAPY DIAG:  Other low back pain - Plan: PT plan of care cert/re-cert  Imbalance - Plan: PT plan of care cert/re-cert  ONSET DATE: 10/2022  SUBJECTIVE:                                                                                                                                                                                           SUBJECTIVE STATEMENT: See pertinent history    PERTINENT HISTORY:  She has prior history of left sided stroke affecting her right side in February 2024. She also had a L3-L4 lumbar decompression surgery in June 2024 with complications due to a epidural hematoma that resulted in cauda equinda syndrome and her needing to return to ER for evacuation and she was in the hospital for an additional 8 days following surgery.  She is just moving back to [place] from [place], because she has qualified for housing in [place]. She describes being most concerned with her balance especially when walking over uneven surfaces. She mainly uses a a 2WW when walking to places that she has to drive to  because it is easier to transport. She has not had a fall in the past 6 mo, but she is scared of falling especially when walking over uneven surfaces. She does report an improvement in LE function with her now being able to raise her toes and that she was not able to do this after surgery.   PAIN:  Are you having pain? Yes: NPRS scale: 5/10 Pain location: L1-L5 Central Spinous   Pain description: Dull   Aggravating factors: Tends to hurt more as the day goes on or the more active she is. Relieving factors: 5 x 325 Viacodin, Alieve  as needed     PRECAUTIONS: Fall; she is afraid of falling    RED FLAGS: None   WEIGHT BEARING RESTRICTIONS: No  FALLS:  Has patient fallen in last 6 months? No  LIVING ENVIRONMENT: Lives with: lives alone Lives in: House/apartment Stairs: Yes: External: 4 steps; on right going up and there is a ramp going to a house and there is a ramped main entrance leading to lobby. Place is called [employer]   Has following equipment at home: Single point cane, Environmental consultant - 2 wheeled, Environmental consultant - 4 wheeled, shower chair, Grab bars, and Ramped entry  OCCUPATION: Retired   PLOF: Independent  PATIENT GOALS: Pt wants to be able to walk outside using single point cane and to feel more confidence when walking especially over uneven surfaces. She currently uses rollator to walk  dog at home and 2WW when walking anywhere where she has to drive.   NEXT MD VISIT: October 2025     OBJECTIVE:  Note: Objective measures were completed at Evaluation unless otherwise noted.  VITALS BP 118/66 HR  60 Sp02  97%  DIAGNOSTIC FINDINGS:  CLINICAL DATA:  Low back pain. Cauda equina syndrome suspected. Cyst removal from the spine two days ago. Postoperative urinary incontinence and bilateral leg numbness.   EXAM: MRI LUMBAR SPINE WITHOUT CONTRAST   TECHNIQUE: Multiplanar, multisequence MR imaging of the lumbar spine was performed. No intravenous contrast was administered.    COMPARISON:  05/18/2022   FINDINGS: Segmentation: 5 lumbar type vertebral bodies as numbered previously.   Alignment: Scoliotic curvature convex to the right as seen previously. No significant listhesis.   Vertebrae: Edematous change of the endplates at T10, U88 and T12, likely to relate to regional pain. This is probably degenerative. Lesser vertebral endplate edema noted at L3-4 and L4-5.   Conus medullaris and cauda equina: Conus extends to the L1 level. Conus and cauda equina appear normal.   Paraspinal and other soft tissues: Posterior surgical approach at the L4-5 level from the right-side has an expected appearance.   Disc levels:   At the surgical level and extending within the spinal canal from the upper L3 level to the L 4 5 disc level, there is a fluid collection which causes marked compression of the thecal sac. This is most severe at the level of the L3-4 disc where the fluid collection within the canal measures 1.8 x 1.5 cm in diameter and markedly flattens the thecal sac anteriorly.   Other than the surgical decompression of the synovial cyst on the right and the postoperative intraspinal fluid collection, there is no change since the preoperative exam, as one might expect.   IMPRESSION: 1. At the surgical level and extending from the upper L3 level to the L4-5 disc level, there is a postoperative fluid collection within the spinal canal which causes marked compression of the thecal sac anteriorly. This is most severe at the level of the L3-4 disc where the fluid collection within the canal measures 1.8 x 1.5 cm in diameter and markedly flattens the thecal sac anteriorly.     Electronically Signed   By: Oneil Officer M.D.   On: 10/12/2022 16:20  PATIENT SURVEYS:  ABC scale: The Activities-Specific Balance Confidence (ABC) Scale 0% 10 20 30  40 50 60 70 80 90 100% No confidence<->completely confident  "How confident are you that you will not lose your  balance or become unsteady when you . . .   Date tested 01/01/24  Walk around the house 80%  2. Walk up or down stairs 80%  3. Bend over and pick up a slipper from in front of a closet floor 60%  4. Reach for a small can off a shelf at eye level 90%  5. Stand on tip toes and reach for something above your head 0%  6. Stand on a chair and reach for something 40%  7. Sweep the floor 50%  8. Walk outside the house to a car parked in the driveway 90%  9. Get into or out of a car 80%  10. Walk across a parking lot to the mall 80%  11. Walk up or down a ramp 80%  12. Walk in a crowded mall where people rapidly walk past you 60%  13. Are bumped into by people as you walk through the mall 40%  14. Step onto or off of an escalator while you are holding onto the railing 50%  15. Step onto or off an escalator while holding onto parcels such that you cannot hold onto the railing 30%  16. Walk outside on icy sidewalks 0%  Total: #/16  57% (910/1600)       COGNITION: Overall cognitive status: Within functional limits for tasks assessed     SENSATION: Light touch: Impaired electricity down her RLE   MUSCLE LENGTH: Hamstrings: Not tested   Debby test: Not tested    POSTURE: rounded shoulders  PALPATION: Not performed    LUMBAR ROM:   AROM eval  Flexion 80%  Extension 100%  Right lateral flexion 100%  Left lateral flexion 100%  Right rotation 100%  Left rotation 100%   (Blank rows = not tested)  LOWER EXTREMITY ROM:     Active  Right eval Left eval  Hip flexion 4- 4  Hip extension    Hip abduction    Hip adduction    Hip internal rotation    Hip external rotation    Knee flexion  4  Knee extension  4  Ankle dorsiflexion    Ankle plantarflexion    Ankle inversion    Ankle eversion     (Blank rows = not tested)  LOWER EXTREMITY MMT:    MMT Right eval Left eval  Hip flexion 4- 4  Hip extension    Hip abduction    Hip adduction    Hip internal rotation    Hip  external rotation    Knee flexion 4 4  Knee extension 4 4  Ankle dorsiflexion 4 4  Ankle plantarflexion    Ankle inversion    Ankle eversion     (Blank rows = not tested)    FUNCTIONAL TESTS:  5 times sit to stand: 14 sec  30 seconds chair stand test: 11 reps   Timed up and go (TUG): NT  2 minute walk test: NT   6 minute walk test: NT   10 meter walk test: NT  Functional gait assessment:  FUNCTIONAL GAIT ASSESSMENT  Date: 01/01/24 Score  GAIT LEVEL SURFACE Instructions: Walk at your normal speed from here to the next mark (6 m) [20 ft].   2.   CHANGE IN GAIT SPEED Instructions: Begin walking at your normal pace (for 1.5 m [5 ft]). When I tell you "go," walk as fast as you can (for 1.5 m [5 ft]). When I tell you "slow," walk as slowly as you can (for 1.5 m [5 ft].   3.    GAIT WITH HORIZONTAL HEAD TURNS Instructions: Walk from here to the next mark 6 m (20 ft) away. Begin walking at your normal pace. Keep walking straight; after 3 steps, turn your head to the right and keep walking straight while looking to the right. After 3 more steps, turn your head to the left and keep walking straight while looking left. Continue alternating looking right and left.   4.   GAIT WITH VERTICAL HEAD TURNS Instructions: Walk from here to the next mark (6 m [20 ft]). Begin walking at your normal pace. Keep walking straight; after 3 steps, tip your head up and keep walking straight while looking up. After 3 more steps, tip your head down, keep walking straight while looking down. Continue  alternating looking up and down every 3 steps until you have completed 2 repetitions in each direction.   5.  GAIT AND PIVOT  TURN Instructions: Begin with walking at your normal pace. When I tell you, "turn and stop," turn as quickly as you can to face the opposite direction and stop.   6.   STEP OVER OBSTACLE Instructions: Begin walking at your normal speed. When you come to the shoe box, step over it, not around it,  and keep walking.   7.   GAIT WITH NARROW BASE OF SUPPORT Instructions: Walk on the floor with arms folded across the chest, feet aligned heel to toe in tandem for a distance of 3.6 m [12 ft]. The number of steps taken in a straight line are counted for a maximum of 10 steps.   8.   GAIT WITH EYES CLOSED Instructions: Walk at your normal speed from here to the next mark (6 m [20 ft]) with your eyes closed.   9.   AMBULATING BACKWARDS Instructions: Walk backwards until I tell you to stop   10. STEPS Instructions: Walk up these stairs as you would at home (ie, using the rail if necessary). At the top turn around and walk down.   Total /30   Interpretation of scores: Non-Specific Older Adults Cutoff Score: <=22/30 = risk of falls Parkinson's Disease Cutoff score <15/30= fall risk (Hoehn & Yahr 1-4)  Minimally Clinically Important Difference (MCID)  Stroke (acute, subacute, and chronic) = MDC: 4.2 points Vestibular (acute) = MDC: 6 points Community Dwelling Older Adults =  MCID: 4 points Parkinson's Disease  =  MDC: 4.3 points  (Academy of Neurologic Physical Therapy (nd). Functional Gait Assessment. Retrieved from https://www.neuropt.org/docs/default-source/cpgs/core-outcome-measures/function-gait-assessment-pocket-guide-proof9-(2).pdf?sfvrsn=b64f35043_0.)  GAIT: Distance walked: 30 ft    Assistive device utilized: Walker - 2 wheeled Level of assistance: Modified independence Comments: Significant use of BUE support with decrease step length bilaterally.    TREATMENT DATE:   01/01/24                                                                                                                              Sit to Stand 1 x 10    -min VC to increase concentric portion and decrease eccentric portion.    PATIENT EDUCATION:  Education details: Form and technique for correct performance of exercise and explanation about cut-offs for functional tests.  Person educated: Patient Education  method: Explanation, Demonstration, Verbal cues, and Handouts Education comprehension: verbalized understanding, returned demonstration, and verbal cues required  HOME EXERCISE PROGRAM: Access Code: [email or web address] URL: [email or web address] Date: 01/01/2024 Prepared by: Toribio Servant  Exercises - Sit to Stand with Arms Crossed  - 3-4 x weekly - 3 sets - 10 reps - Seated Flexion Stretch with Swiss Ball  - 1 x daily - 7 x weekly - 2-3 reps - 60 sec hold  ASSESSMENT:  CLINICAL IMPRESSION: Patient is a 63 y.o. white female who was seen today for physical therapy evaluation and treatment for low back pain and unsteadiness. She has decreased LE strength and endurance along with balance confidence that places her  at an increase risk for falls. She does continue to have low back pain and decreased ROM following her lumbar decompression. However, these deficits have not limited her from carrying out most of her activities of daily living with exception of carrying her groceries into her apartment, which she needs assistance with due to imbalance. She does have minor strength deficits with RLE being slightly weaker than LLE but this is not as significant as expected given her history of recent stroke. She will benefit from skilled PT to address these aforementioned deficits to return to walking with a decreased level of assistive device and to decrease risk of falling.    OBJECTIVE IMPAIRMENTS: Abnormal gait, decreased endurance, decreased ROM, decreased strength, impaired flexibility, impaired sensation, obesity, and pain.   ACTIVITY LIMITATIONS: carrying, lifting, bending, standing, squatting, stairs, dressing, hygiene/grooming, and locomotion level  PARTICIPATION LIMITATIONS: meal prep, cleaning, laundry, shopping, community activity, and yard work  PERSONAL FACTORS: Fitness, Past/current experiences, Time since onset of injury/illness/exacerbation, and 3+ comorbidities: h/o stroke with  right sided deficits, L3-L5 decompression, HLD, HTN are also affecting patient's functional outcome.   REHAB POTENTIAL: Fair multiple co-morbidities and chronicity of condition  CLINICAL DECISION MAKING: Evolving/moderate complexity  EVALUATION COMPLEXITY: Moderate   GOALS: Goals reviewed with patient? No  SHORT TERM GOALS: Target date: 01/15/2024  Patient will demonstrate undestanding of home exercise plan by performing exercises correctly with evidence of good carry over with min to no verbal or tactile cues .   Baseline: NT   Goal status: INITIAL  2.   Pt will increase by at least 40m (168ft) in order to demonstrate clinically significant improvement in cardiopulmonary endurance and community ambulation Baseline: NT   Goal status: INITIAL  3.  Patient will show decreased risk of falling as evidence by decrease in self-selected gait speed by >= 0.12 m/sec.  Baseline: NT   Goal status: INITIAL  4.  Pt will decrease 5TSTS by at least 3 seconds to demonstrate a minimal clinically significant improvement in LE strength.  Baseline: NT  Goal status: INITIAL   LONG TERM GOALS: Target date: 03/25/2024  Patient will improve activities specific balance scale score >=67% as evidence of an improvement self-perception of function and to decrease risk for falls Mae et al., 2022).  Baseline:  Goal status: INITIAL  2.  Patient will perform 5 x STS in <11.4 sec to demonstrate improve LE strength to decrease risk of falling (Bohannon, 2006). Baseline:  Goal status: INITIAL Baseline: 14 sec    Goal status: INITIAL  3.  Patient will complete >=12 reps on 30 sec chair stands to exhibit improved LE endurance to decrease risk of falling.  Baseline: 11 reps    Goal status: INITIAL  4.  Patient will ambulate >=1000 ft in to show community ambulator status to be able to have aerobic endurance to return to fulfilling walking tasks at his job.  Baseline: NT  Goal status:  INITIAL  5.  Patient will demonstrate reduced falls risk as evidenced by Dynamic Gait Index (DGI) >19/24 to decrease risk of falling.  Baseline: NT   Goal status: INITIAL  6.  Patient will perform TUG in <13.5 sec as evidence of improved mobility and decreased risk for falling (Shumway-Cook et al., 2000) Baseline:  NT   Goal status: INITIAL  7. Patient will be able to ambulate safely with the least restrictive assistive device outside as  evidenced by decreased lateral sway, consistent stride length and step length, and with knowledge  of when to take a break due to fatigue.  Baseline: Using 2WW or 4WW Goal status: INITIAL  8. Patient will perform in >=1.0 m./sec as evidence of improved mobility, decreased falls risk, and overall functional independence.  Baseline: NT  Goal status: INITIAL PLAN:  PT FREQUENCY: 1-2x/week  PT DURATION: 12 weeks  PLANNED INTERVENTIONS: [id number]- PT Re-evaluation, 97750- Physical Performance Testing, 97110-Therapeutic exercises, 97530- Therapeutic activity, [id number]- Neuromuscular re-education, 97535- Self Care, [id number]- Manual therapy, [id number]- Gait training, [phone]- Orthotic Initial, [phone]- Orthotic/Prosthetic subsequent, [phone]- Canalith repositioning, [phone]- Aquatic Therapy, [phone]- Electrical stimulation (unattended), [phone]- Electrical stimulation (manual), [id number]- Ultrasound, [id number] (1-2 muscles), [id number] (3+ muscles)- Dry Needling, Patient/Family education, Balance training, Stair training, Taping, Joint mobilization, Joint manipulation, Spinal manipulation, Spinal mobilization, Scar mobilization, Vestibular training, DME instructions, Cryotherapy, Moist heat, and Biofeedback.  PLAN FOR NEXT SESSION: Nu-Step for warm up. Finish functional outcome measures: TUG, 10 mWT, , and dynamic gait assessment. Assess muscle length: straight leg raise test. Add seated HS stretch and progress LE strengthening exercises.   Toribio Servant PT, DPT  [facility] [address] [place], [address] Phone: [phone]   Fax:  [phone]

## 2024-01-02 ENCOUNTER — Other Ambulatory Visit: Payer: Self-pay

## 2024-01-02 ENCOUNTER — Other Ambulatory Visit (HOSPITAL_COMMUNITY): Payer: Self-pay

## 2024-01-03 ENCOUNTER — Other Ambulatory Visit: Payer: Self-pay

## 2024-01-03 ENCOUNTER — Other Ambulatory Visit (HOSPITAL_COMMUNITY): Payer: Self-pay

## 2024-01-03 DIAGNOSIS — N3941 Urge incontinence: Secondary | ICD-10-CM

## 2024-01-03 DIAGNOSIS — I10 Essential (primary) hypertension: Secondary | ICD-10-CM

## 2024-01-03 DIAGNOSIS — I700 Atherosclerosis of aorta: Secondary | ICD-10-CM

## 2024-01-03 MED ORDER — OZEMPIC (2 MG/DOSE) 8 MG/3ML ~~LOC~~ SOPN
2.0000 mg | PEN_INJECTOR | SUBCUTANEOUS | 6 refills | Status: DC
  Filled 2024-01-03: qty 3, 28d supply, fill #0

## 2024-01-03 MED ORDER — ROSUVASTATIN CALCIUM 20 MG PO TABS
20.0000 mg | ORAL_TABLET | Freq: Every day | ORAL | 3 refills | Status: DC
  Filled 2024-01-03: qty 90, 90d supply, fill #0
  Filled 2024-01-11: qty 30, 30d supply, fill #0

## 2024-01-03 MED ORDER — DULOXETINE HCL 20 MG PO CPEP
20.0000 mg | ORAL_CAPSULE | Freq: Every day | ORAL | 7 refills | Status: AC
  Filled 2024-01-03 – 2024-01-16 (×3): qty 30, 30d supply, fill #0
  Filled 2024-02-13: qty 30, 30d supply, fill #1
  Filled 2024-03-19: qty 30, 30d supply, fill #2
  Filled 2024-04-12: qty 30, 30d supply, fill #3

## 2024-01-03 MED ORDER — BISACODYL 10 MG RE SUPP
10.0000 mg | RECTAL | 0 refills | Status: DC | PRN
  Filled 2024-01-03: qty 12, 12d supply, fill #0

## 2024-01-03 MED ORDER — GABAPENTIN 300 MG PO CAPS
600.0000 mg | ORAL_CAPSULE | Freq: Two times a day (BID) | ORAL | 3 refills | Status: DC
  Filled 2024-01-03: qty 360, 90d supply, fill #0
  Filled 2024-01-11: qty 120, 30d supply, fill #0

## 2024-01-03 MED ORDER — TRAZODONE HCL 50 MG PO TABS: ORAL_TABLET | ORAL | 2 refills | Status: DC

## 2024-01-03 MED ORDER — ASPIRIN 81 MG PO TBEC
81.0000 mg | DELAYED_RELEASE_TABLET | Freq: Every day | ORAL | 3 refills | Status: DC
  Filled 2024-01-03: qty 90, 90d supply, fill #0
  Filled 2024-01-11: qty 30, 30d supply, fill #0
  Filled 2024-01-16: qty 90, 90d supply, fill #0

## 2024-01-03 MED ORDER — DULOXETINE HCL 30 MG PO CPEP
30.0000 mg | ORAL_CAPSULE | Freq: Every day | ORAL | 9 refills | Status: AC
  Filled 2024-01-03 – 2024-01-16 (×3): qty 30, 30d supply, fill #0
  Filled 2024-02-13: qty 30, 30d supply, fill #1
  Filled 2024-03-19: qty 30, 30d supply, fill #2
  Filled 2024-04-12: qty 30, 30d supply, fill #3

## 2024-01-03 MED ORDER — CYCLOBENZAPRINE HCL 5 MG PO TABS
2.5000 mg | ORAL_TABLET | Freq: Two times a day (BID) | ORAL | 6 refills | Status: AC | PRN
Start: 2023-08-08 — End: ?
  Filled 2024-01-03 – 2024-01-16 (×3): qty 60, 30d supply, fill #0

## 2024-01-03 MED ORDER — ASPIRIN 81 MG PO TBEC
81.0000 mg | DELAYED_RELEASE_TABLET | Freq: Every day | ORAL | 6 refills | Status: AC
Start: 2023-07-10 — End: ?
  Filled 2024-01-03 – 2024-01-17 (×2): qty 30, 30d supply, fill #0
  Filled ????-??-??: fill #0

## 2024-01-03 MED ORDER — GABAPENTIN 300 MG PO CAPS
600.0000 mg | ORAL_CAPSULE | Freq: Two times a day (BID) | ORAL | 1 refills | Status: AC
  Filled 2024-01-17 – 2024-03-19 (×2): qty 120, 30d supply, fill #0

## 2024-01-03 MED ORDER — GEMTESA 75 MG PO TABS
1.0000 | ORAL_TABLET | Freq: Every day | ORAL | 3 refills | Status: DC
  Filled 2024-01-03: qty 90, 90d supply, fill #0
  Filled 2024-01-11 – 2024-01-16 (×2): qty 30, 30d supply, fill #0
  Filled 2024-02-13: qty 30, 30d supply, fill #1
  Filled 2024-03-19: qty 30, 30d supply, fill #2
  Filled 2024-04-12: qty 30, 30d supply, fill #3

## 2024-01-03 MED ORDER — VITAMIN B12 100 MCG PO TABS
100.0000 ug | ORAL_TABLET | Freq: Every day | ORAL | 3 refills | Status: DC
  Filled 2024-01-03: qty 90, 90d supply, fill #0
  Filled 2024-01-11 – 2024-01-16 (×2): qty 30, 30d supply, fill #0

## 2024-01-03 MED ORDER — SPIRONOLACTONE 25 MG PO TABS
ORAL_TABLET | ORAL | 0 refills | Status: DC
  Filled 2024-01-11: qty 30, 30d supply, fill #0
  Filled 2024-01-16: qty 90, fill #0

## 2024-01-03 MED ORDER — TELMISARTAN 80 MG PO TABS
80.0000 mg | ORAL_TABLET | Freq: Every day | ORAL | 5 refills | Status: AC
  Filled 2024-01-03 – 2024-01-16 (×3): qty 30, 30d supply, fill #0
  Filled 2024-02-13 – 2024-02-15 (×2): qty 30, 30d supply, fill #1
  Filled 2024-03-19 – 2024-03-21 (×2): qty 30, 30d supply, fill #2
  Filled 2024-04-12 – 2024-04-23 (×3): qty 30, 30d supply, fill #3

## 2024-01-03 MED ORDER — OZEMPIC (2 MG/DOSE) 8 MG/3ML ~~LOC~~ SOPN
PEN_INJECTOR | SUBCUTANEOUS | 9 refills | Status: DC
  Filled 2024-01-11: qty 3, 28d supply, fill #0
  Filled 2024-02-14: qty 3, 28d supply, fill #1
  Filled 2024-03-08: qty 3, 28d supply, fill #2

## 2024-01-03 MED ORDER — CARVEDILOL 12.5 MG PO TABS
12.5000 mg | ORAL_TABLET | Freq: Two times a day (BID) | ORAL | 3 refills | Status: DC
  Filled 2024-01-03: qty 180, 90d supply, fill #0
  Filled 2024-01-11: qty 60, 30d supply, fill #0

## 2024-01-03 MED ORDER — LURASIDONE HCL 20 MG PO TABS
20.0000 mg | ORAL_TABLET | Freq: Every day | ORAL | 0 refills | Status: DC
  Filled 2024-01-03 – 2024-01-16 (×3): qty 30, 30d supply, fill #0

## 2024-01-03 NOTE — Therapy (Signed)
 OUTPATIENT PHYSICAL THERAPY THORACOLUMBAR AND BALANCE  TREATMENT   Patient Name: Susan [name] MRN: [id number] DOB:1960-06-06, 63 y.o., female Today's Date: 01/04/2024  END OF SESSION:  PT End of Session - 01/04/24 1257     Visit Number 2    Number of Visits 24    Date for PT Re-Evaluation 03/25/24    Authorization Type [employer] Medicare Dual 2025    Authorization - Number of Visits 24    Progress Note Due on Visit 10    PT Start Time 1300    PT Stop Time 1341    PT Time Calculation (min) 41 min    Activity Tolerance Patient tolerated treatment well    Behavior During Therapy [facility] for tasks assessed/performed           Past Medical History:  Diagnosis Date   ADD (attention deficit disorder)    Alcohol abuse    Anemia    Anxiety    Aortic atherosclerosis (HCC) 02/20/2018   Chest CT Sept 2019   Arthritis    rheumatoid arthritis   Asthma    Bipolar disorder (HCC)    Centrilobular emphysema (HCC)    Chronic diarrhea 07/22/2022   Cocaine use disorder, moderate, in sustained remission (HCC)    Constipation    Degenerative disc disease at L5-S1 level 09/28/2016   See ortho note May 2018   Depression    bipolar, hx of suicide attempt   Diabetes mellitus, type 2 (HCC)    Diverticulitis 2013   DOE (dyspnea on exertion)    Drug use    Family history of adverse reaction to anesthesia    mom-delayed emergence   GERD (gastroesophageal reflux disease)    H/O suicide attempt    slit wrists   Hepatitis C 06/26/2014   treated with Harvoni   Hip pain    History of echocardiogram    a. 04/2019 Echo: EF >65%, nl RV fxn.   History of MRSA infection 2013   HLD (hyperlipidemia)    Hypertension    a. 06/2021 Renal Duplex: ? bilat RAS; b. 06/2021 CTA Abd: No signif RAS.   Hyponatremia    Hypothyroidism    Incisional hernia 11/08/2012   Knee pain    Left carotid bruit    Morbid obesity (HCC)    Multinodular thyroid     OSA (obstructive sleep apnea)    OSA on CPAP     Osteoporosis    Palpitations    Post-traumatic osteoarthritis of right knee 09/24/2015   Recurrent ventral hernia 11/08/2012   Skin lesion 06/28/2022   Status post total hip replacement, left 07/23/2019   Status post total right knee replacement using cement 05/10/2016   Stroke (HCC) 07/05/2022   right arm,torso and right leg numbness   Tobacco use disorder    Tobacco use disorder, continuous 06/26/2014   Vitamin B12 deficiency    Vitamin D  deficiency 04/09/2015   Past Surgical History:  Procedure Laterality Date   BILATERAL SALPINGOOPHORECTOMY     due to abnormal mass   BREAST BIOPSY Left    neg   BREAST SURGERY Left 20 yrs ago   CESAREAN SECTION     COLONOSCOPY     COLONOSCOPY WITH PROPOFOL  N/A 10/16/2017   Procedure: COLONOSCOPY WITH PROPOFOL ;  Surgeon: Therisa Bi, MD;  Location: [facility] ENDOSCOPY;  Service: Gastroenterology;  Laterality: N/A;   COLONOSCOPY WITH PROPOFOL  N/A 02/16/2021   Procedure: COLONOSCOPY WITH PROPOFOL ;  Surgeon: Therisa Bi, MD;  Location[facility] ENDOSCOPY;  Service: Gastroenterology;  Laterality: N/A;   FORAMINOTOMY 1 LEVEL Right 10/10/2022   Procedure: RIGHT L4-5 LAMINOFORAMINOTOMY;  Surgeon: Clois Fret, MD;  Location: [facility] ORS;  Service: Neurosurgery;  Laterality: Right;   HERNIA REPAIR  06/2011, July 2014   Ventral wall repair with Physiomesh   HERNIA REPAIR     2nd.vental wall repair   JOINT REPLACEMENT Right    knee   LUMBAR LAMINECTOMY/ DECOMPRESSION WITH MET-RX N/A 10/12/2022   Procedure: LUMBAR LAMINECTOMY/ DECOMPRESSION WITH MET-RX;  Surgeon: Clois Fret, MD;  Location: [facility] ORS;  Service: Neurosurgery;  Laterality: N/A;   TONSILLECTOMY     TOTAL HIP ARTHROPLASTY Left 07/23/2019   Procedure: TOTAL HIP ARTHROPLASTY;  Surgeon: Edie Norleen PARAS, MD;  Location: [facility] ORS;  Service: Orthopedics;  Laterality: Left;   TOTAL KNEE ARTHROPLASTY Right 05/10/2016   Procedure: TOTAL KNEE ARTHROPLASTY;  Surgeon: Norleen PARAS Edie, MD;  Location: [facility] ORS;   Service: Orthopedics;  Laterality: Right;   TUBAL LIGATION     Patient Active Problem List   Diagnosis Date Noted   Urge incontinence 12/22/2023   Painful and cold lower extremity 12/22/2023   Hemiplegia and hemiparesis following cerebral infarction affecting right dominant side (HCC) 12/04/2023   CKD (chronic kidney disease) stage 3, GFR 30-59 ml/min (HCC) 12/04/2023   Dysuria 11/03/2023   History of stroke 04/17/2023   Right leg weakness 04/17/2023   Neurogenic bladder 02/13/2023   Abnormality of gait and mobility 02/13/2023   Morbid obesity with body mass index (BMI) of 40.0 to 44.9 in adult (HCC) 01/10/2023   Left thyroid  nodule 01/10/2023   Bipolar 1 disorder, depressed, moderate (HCC) 11/29/2022   Normocytic normochromic anemia 10/13/2022   Epidural hematoma (HCC) 10/12/2022   Mass in epidural space 10/12/2022   Synovial cyst of lumbar facet joint 10/10/2022   Lumbar radiculopathy 10/10/2022   S/P laminectomy 10/10/2022   Epigastric pain 07/22/2022   Enlarged thyroid  07/22/2022   CVA (cerebrovascular accident) (HCC) 07/05/2022   Type II diabetes mellitus with nephropathy (HCC) 06/16/2021   H/O thyroid  nodule 05/31/2021   History of hepatitis C- treated 2014- 2015 with [facility] gastroenterology and followed.  05/19/2021   Left carotid bruit 04/28/2021   Bipolar disorder, in full remission, most recent episode depressed (HCC) 02/05/2021   Anxiety 05/28/2020   Bipolar affective disorder in remission (HCC) 05/28/2020   Bipolar disorder, in full remission, most recent episode mixed (HCC) 03/06/2020   Need for immunization against influenza 01/27/2020   Morbid obesity (HCC) 01/27/2020   Osteoporosis 11/06/2019   Bipolar 1 disorder, mixed, mild (HCC) 10/29/2019   PTSD (post-traumatic stress disorder) 09/27/2019   Bereavement 09/27/2019   Cocaine use disorder, moderate, in sustained remission (HCC) 09/27/2019   Primary osteoarthritis of left hip 06/14/2019   Allergic rhinitis  08/23/2018   Aortic atherosclerosis (HCC) 02/20/2018   Centrilobular emphysema (HCC) 02/20/2018   Hyperlipidemia 03/28/2017   Degenerative disc disease at L5-S1 level 09/28/2016   GERD (gastroesophageal reflux disease) 12/17/2015   Chronic back pain 10/09/2015   Post-traumatic osteoarthritis of right knee 09/24/2015   Alcohol use disorder, severe, in sustained remission (HCC) 04/13/2015   OSA on CPAP 04/09/2015   Subclinical hyperthyroidism 06/26/2014   Goiter colloid, toxic, nodular 06/26/2014   Hypertension 06/26/2014   Bipolar I disorder (HCC) 06/26/2014   Lumbar radiculitis 10/03/2013   Diverticulosis 08/24/2013    PCP: Dr. Luke Shade     REFERRING PROVIDER: Dr. Fret Clois    REFERRING DIAG: M54.50,G89.29 (ICD-10-CM) - Chronic bilateral low back pain without sciatica  Rationale for Evaluation and Treatment: Rehabilitation  THERAPY DIAG:  Other low back pain  Imbalance  Muscle weakness (generalized)  Difficulty in walking, not elsewhere classified  ONSET DATE: 10/2022  SUBJECTIVE:                                                                                                                                                                                           SUBJECTIVE STATEMENT:   Patient inquiring about medical supply store to get a electric scooter. Patient reports 7/10 pain R side low back. Took pain medication before session.   PERTINENT HISTORY:  She has prior history of left sided stroke affecting her right side in February 2024. She also had a L3-L4 lumbar decompression surgery in June 2024 with complications due to a epidural hematoma that resulted in cauda equinda syndrome and her needing to return to ER for evacuation and she was in the hospital for an additional 8 days following surgery.  She is just moving back to [place] from [place], because she has qualified for housing in [place]. She describes being most concerned with her balance  especially when walking over uneven surfaces. She mainly uses a a 2WW when walking to places that she has to drive to because it is easier to transport. She has not had a fall in the past 6 mo, but she is scared of falling especially when walking over uneven surfaces. She does report an improvement in LE function with her now being able to raise her toes and that she was not able to do this after surgery.   PAIN:  Are you having pain? Yes: NPRS scale: 5/10 Pain location: L1-L5 [place] Spinous   Pain description: Dull   Aggravating factors: Tends to hurt more as the day goes on or the more active she is. Relieving factors: 5 x 325 Viacodin, Alieve  as needed     PRECAUTIONS: Fall; she is afraid of falling    RED FLAGS: None   WEIGHT BEARING RESTRICTIONS: No  FALLS:  Has patient fallen in last 6 months? No  LIVING ENVIRONMENT: Lives with: lives alone Lives in: House/apartment Stairs: Yes: External: 4 steps; on right going up and there is a ramp going to a house and there is a ramped main entrance leading to lobby. Place is called [employer]   Has following equipment at home: Single point cane, Environmental consultant - 2 wheeled, Environmental consultant - 4 wheeled, shower chair, Grab bars, and Ramped entry  OCCUPATION: Retired   PLOF: Independent  PATIENT GOALS: Pt wants to be able to walk outside using single point cane and to feel more confidence when  walking especially over uneven surfaces. She currently uses rollator to walk dog at home and 2WW when walking anywhere where she has to drive.   NEXT MD VISIT: October 2025     OBJECTIVE:  Note: Objective measures were completed at Evaluation unless otherwise noted.  VITALS BP 118/66 HR  60 Sp02  97%  DIAGNOSTIC FINDINGS:  CLINICAL DATA:  Low back pain. Cauda equina syndrome suspected. Cyst removal from the spine two days ago. Postoperative urinary incontinence and bilateral leg numbness.   EXAM: MRI LUMBAR SPINE WITHOUT CONTRAST    TECHNIQUE: Multiplanar, multisequence MR imaging of the lumbar spine was performed. No intravenous contrast was administered.   COMPARISON:  05/18/2022   FINDINGS: Segmentation: 5 lumbar type vertebral bodies as numbered previously.   Alignment: Scoliotic curvature convex to the right as seen previously. No significant listhesis.   Vertebrae: Edematous change of the endplates at T10, U88 and T12, likely to relate to regional pain. This is probably degenerative. Lesser vertebral endplate edema noted at L3-4 and L4-5.   Conus medullaris and cauda equina: Conus extends to the L1 level. Conus and cauda equina appear normal.   Paraspinal and other soft tissues: Posterior surgical approach at the L4-5 level from the right-side has an expected appearance.   Disc levels:   At the surgical level and extending within the spinal canal from the upper L3 level to the L 4 5 disc level, there is a fluid collection which causes marked compression of the thecal sac. This is most severe at the level of the L3-4 disc where the fluid collection within the canal measures 1.8 x 1.5 cm in diameter and markedly flattens the thecal sac anteriorly.   Other than the surgical decompression of the synovial cyst on the right and the postoperative intraspinal fluid collection, there is no change since the preoperative exam, as one might expect.   IMPRESSION: 1. At the surgical level and extending from the upper L3 level to the L4-5 disc level, there is a postoperative fluid collection within the spinal canal which causes marked compression of the thecal sac anteriorly. This is most severe at the level of the L3-4 disc where the fluid collection within the canal measures 1.8 x 1.5 cm in diameter and markedly flattens the thecal sac anteriorly.     Electronically Signed   By: Oneil Officer M.D.   On: 10/12/2022 16:20  PATIENT SURVEYS:  ABC scale: The Activities-Specific Balance Confidence (ABC)  Scale 0% 10 20 30  40 50 60 70 80 90 100% No confidence<->completely confident  "How confident are you that you will not lose your balance or become unsteady when you . . .   Date tested 01/01/24  Walk around the house 80%  2. Walk up or down stairs 80%  3. Bend over and pick up a slipper from in front of a closet floor 60%  4. Reach for a small can off a shelf at eye level 90%  5. Stand on tip toes and reach for something above your head 0%  6. Stand on a chair and reach for something 40%  7. Sweep the floor 50%  8. Walk outside the house to a car parked in the driveway 90%  9. Get into or out of a car 80%  10. Walk across a parking lot to the mall 80%  11. Walk up or down a ramp 80%  12. Walk in a crowded mall where people rapidly walk past you 60%  13. Are  bumped into by people as you walk through the mall 40%  14. Step onto or off of an escalator while you are holding onto the railing 50%  15. Step onto or off an escalator while holding onto parcels such that you cannot hold onto the railing 30%  16. Walk outside on icy sidewalks 0%  Total: #/16  57% (910/1600)       COGNITION: Overall cognitive status: Within functional limits for tasks assessed     SENSATION: Light touch: Impaired electricity down her RLE   MUSCLE LENGTH: Hamstrings: Not tested   Debby test: Not tested    POSTURE: rounded shoulders  PALPATION: Not performed    LUMBAR ROM:   AROM eval  Flexion 80%  Extension 100%  Right lateral flexion 100%  Left lateral flexion 100%  Right rotation 100%  Left rotation 100%   (Blank rows = not tested)  LOWER EXTREMITY ROM:     Active  Right eval Left eval  Hip flexion 4- 4  Hip extension    Hip abduction    Hip adduction    Hip internal rotation    Hip external rotation    Knee flexion  4  Knee extension  4  Ankle dorsiflexion    Ankle plantarflexion    Ankle inversion    Ankle eversion     (Blank rows = not tested)  LOWER EXTREMITY MMT:     MMT Right eval Left eval  Hip flexion 4- 4  Hip extension    Hip abduction    Hip adduction    Hip internal rotation    Hip external rotation    Knee flexion 4 4  Knee extension 4 4  Ankle dorsiflexion 4 4  Ankle plantarflexion    Ankle inversion    Ankle eversion     (Blank rows = not tested)    FUNCTIONAL TESTS:  5 times sit to stand: 14 sec  30 seconds chair stand test: 11 reps   Timed up and go (TUG): NT  2 minute walk test: NT   6 minute walk test: NT   10 meter walk test: NT  Functional gait assessment:  FUNCTIONAL GAIT ASSESSMENT  Date: 01/01/24 Score  GAIT LEVEL SURFACE Instructions: Walk at your normal speed from here to the next mark (6 m) [20 ft].   2.   CHANGE IN GAIT SPEED Instructions: Begin walking at your normal pace (for 1.5 m [5 ft]). When I tell you "go," walk as fast as you can (for 1.5 m [5 ft]). When I tell you "slow," walk as slowly as you can (for 1.5 m [5 ft].   3.    GAIT WITH HORIZONTAL HEAD TURNS Instructions: Walk from here to the next mark 6 m (20 ft) away. Begin walking at your normal pace. Keep walking straight; after 3 steps, turn your head to the right and keep walking straight while looking to the right. After 3 more steps, turn your head to the left and keep walking straight while looking left. Continue alternating looking right and left.   4.   GAIT WITH VERTICAL HEAD TURNS Instructions: Walk from here to the next mark (6 m [20 ft]). Begin walking at your normal pace. Keep walking straight; after 3 steps, tip your head up and keep walking straight while looking up. After 3 more steps, tip your head down, keep walking straight while looking down. Continue  alternating looking up and down every 3 steps until you have completed  2 repetitions in each direction.   5.  GAIT AND PIVOT TURN Instructions: Begin with walking at your normal pace. When I tell you, "turn and stop," turn as quickly as you can to face the opposite direction and stop.    6.   STEP OVER OBSTACLE Instructions: Begin walking at your normal speed. When you come to the shoe box, step over it, not around it, and keep walking.   7.   GAIT WITH NARROW BASE OF SUPPORT Instructions: Walk on the floor with arms folded across the chest, feet aligned heel to toe in tandem for a distance of 3.6 m [12 ft]. The number of steps taken in a straight line are counted for a maximum of 10 steps.   8.   GAIT WITH EYES CLOSED Instructions: Walk at your normal speed from here to the next mark (6 m [20 ft]) with your eyes closed.   9.   AMBULATING BACKWARDS Instructions: Walk backwards until I tell you to stop   10. STEPS Instructions: Walk up these stairs as you would at home (ie, using the rail if necessary). At the top turn around and walk down.   Total /30   Interpretation of scores: Non-Specific Older Adults Cutoff Score: <=22/30 = risk of falls Parkinson's Disease Cutoff score <15/30= fall risk (Hoehn & Yahr 1-4)  Minimally Clinically Important Difference (MCID)  Stroke (acute, subacute, and chronic) = MDC: 4.2 points Vestibular (acute) = MDC: 6 points Community Dwelling Older Adults =  MCID: 4 points Parkinson's Disease  =  MDC: 4.3 points  (Academy of Neurologic Physical Therapy (nd). Functional Gait Assessment. Retrieved from https://www.neuropt.org/docs/default-source/cpgs/core-outcome-measures/function-gait-assessment-pocket-guide-proof9-(2).pdf?sfvrsn=b22f35043_0.)  GAIT: Distance walked: 30 ft    Assistive device utilized: Janaki Exley - 2 wheeled Level of assistance: Modified independence Comments: Significant use of BUE support with decrease step length bilaterally.    TREATMENT DATE:   01/04/24  : patient ended ambulation about 5 minutes - 553' with RW  : 0.61 m/s  TUG: 15.38 seconds with RW  DGI: deferred due to improper shoes - will wear better shoes next visit    Sit to stand  2 x 10 with no UE support   Standing marching x 10 with RW  support Standing hip abduction x 10 with B UE support  Standing hip extension x 10 with B UE support Standing heel raises x 10 with B UE support   Updated HEP and provided handout  PATIENT EDUCATION:  Education details: Form and technique for correct performance of exercise and explanation about cut-offs for functional tests.  Person educated: Patient Education method: Explanation, Demonstration, Verbal cues, and Handouts Education comprehension: verbalized understanding, returned demonstration, and verbal cues required  HOME EXERCISE PROGRAM: Access Code: [email or web address] URL: [email or web address] Date: 01/01/2024 Prepared by: Toribio Servant  Exercises - Sit to Stand with Arms Crossed  - 3-4 x weekly - 3 sets - 10 reps - Seated Flexion Stretch with Swiss Ball  - 1 x daily - 7 x weekly - 2-3 reps - 60 sec hold  Access Code: [email or web address] URL: [email or web address] Date: 01/04/2024 Prepared by: Maryanne Finder  Exercises - Standing March with Counter Support  - 2-3 x daily - 5-7 x weekly - 3 sets - 10 reps - Standing Hip Abduction with Counter Support  - 2-3 x daily - 5-7 x weekly - 3 sets - 10 reps - Standing Hip Extension with Counter Support  - 2-3 x daily - 5-7 x weekly - 3 sets - 10 reps - Heel Raises  with Counter Support  - 2-3 x daily - 5-7 x weekly - 3 sets - 10 reps  ASSESSMENT:  CLINICAL IMPRESSION:   Session focused on completion of performance tests, however unable to complete DGI 2/2 improper shoes. Patient completed TUG in 15.38 seconds with RW and unable to complete full , see above. Updated HEP with B LE strengthening with good follow through after demonstration. She will benefit from skilled PT to address these aforementioned deficits to return to walking with a decreased level of assistive device and to decrease risk of falling.    OBJECTIVE IMPAIRMENTS: Abnormal gait, decreased endurance, decreased ROM, decreased strength, impaired  flexibility, impaired sensation, obesity, and pain.   ACTIVITY LIMITATIONS: carrying, lifting, bending, standing, squatting, stairs, dressing, hygiene/grooming, and locomotion level  PARTICIPATION LIMITATIONS: meal prep, cleaning, laundry, shopping, community activity, and yard work  PERSONAL FACTORS: Fitness, Past/current experiences, Time since onset of injury/illness/exacerbation, and 3+ comorbidities: h/o stroke with right sided deficits, L3-L5 decompression, HLD, HTN are also affecting patient's functional outcome.   REHAB POTENTIAL: Fair multiple co-morbidities and chronicity of condition  CLINICAL DECISION MAKING: Evolving/moderate complexity  EVALUATION COMPLEXITY: Moderate   GOALS: Goals reviewed with patient? No  SHORT TERM GOALS: Target date: 01/15/2024  Patient will demonstrate undestanding of home exercise plan by performing exercises correctly with evidence of good carry over with min to no verbal or tactile cues .   Baseline: NT   Goal status: INITIAL  2.   Pt will increase by at least 55m (162ft) in order to demonstrate clinically significant improvement in cardiopulmonary endurance and community ambulation Baseline: 8/28: 553' with RW and unable to complete 6 minutes (stopped at 5 minutes)   Goal status: INITIAL  3.  Patient will show decreased risk of falling as evidence by decrease in self-selected gait speed by >= 0.12 m/sec.  Baseline: 8/28: 0.61 m/s Goal status: INITIAL  4.  Pt will decrease 5TSTS by at least 3 seconds to demonstrate a minimal clinically significant improvement in LE strength.  Baseline: 14 seconds  Goal status: INITIAL   LONG TERM GOALS: Target date: 03/25/2024  Patient will improve activities specific balance scale score >=67% as evidence of an improvement self-perception of function and to decrease risk for falls Mae et al., 2022).  Baseline:  Goal status: INITIAL  2.  Patient will perform 5 x STS in <11.4 sec to demonstrate  improve LE strength to decrease risk of falling (Bohannon, 2006). Baseline:  Goal status: INITIAL Baseline: 14 sec    Goal status: INITIAL  3.  Patient will complete >=12 reps on 30 sec chair stands to exhibit improved LE endurance to decrease risk of falling.  Baseline: 11 reps    Goal status: INITIAL  4.  Patient will ambulate >=1000 ft in to show community ambulator status to be able to have aerobic endurance to return to fulfilling walking tasks at his job.  Baseline: 8/28: 553' with RW and unable to complete 6 minutes (stopped at 5 minutes) Goal status: INITIAL  5.  Patient will demonstrate reduced falls risk as evidenced by Dynamic Gait Index (DGI) >19/24 to decrease risk of falling.  Baseline: NT   Goal status: INITIAL  6.  Patient will perform TUG in <13.5 sec as evidence of improved mobility and decreased risk for falling [facility] et al., 2000) Baseline:  8/28: 15.38 seconds with RW  Goal status: INITIAL  7. Patient will be able to ambulate safely with the least restrictive assistive device outside as  evidenced by decreased lateral sway, consistent stride length and step length, and with knowledge of when to take a break due to fatigue.  Baseline: Using 2WW or 4WW Goal status: INITIAL  8. Patient will perform in >=1.0 m./sec as evidence of improved mobility, decreased falls risk, and overall functional independence.  Baseline: 8/28: 0.61 m/s  Goal status: INITIAL PLAN:  PT FREQUENCY: 1-2x/week  PT DURATION: 12 weeks  PLANNED INTERVENTIONS: [id number]- PT Re-evaluation, 97750- Physical Performance Testing, 97110-Therapeutic exercises, 97530- Therapeutic activity, [id number]- Neuromuscular re-education, 97535- Self Care, [id number]- Manual therapy, [phone]- Gait training, [phone]- Orthotic Initial, [phone]- Orthotic/Prosthetic subsequent, [phone]- Canalith repositioning, [phone]- Aquatic Therapy, [phone]- Electrical stimulation (unattended), [phone]- Electrical stimulation (manual),  [id number]- Ultrasound, 79439 (1-2 muscles), [id number] (3+ muscles)- Dry Needling, Patient/Family education, Balance training, Stair training, Taping, Joint mobilization, Joint manipulation, Spinal manipulation, Spinal mobilization, Scar mobilization, Vestibular training, DME instructions, Cryotherapy, Moist heat, and Biofeedback.  PLAN FOR NEXT SESSION: Nu-Step for warm up. Finish functional outcome measures: TUG, 10 mWT, , and dynamic gait assessment. Assess muscle length: straight leg raise test. Add seated HS stretch and progress LE strengthening exercises.   Maryanne Finder, PT, DPT Physical Therapist - [place]  [facility]

## 2024-01-03 NOTE — Progress Notes (Signed)
 Pharmacy changing to [facility].  Refill sent per pharmacist request.  1. Primary hypertension (Primary) - carvedilol  (COREG ) 12.5 MG tablet; Take 1 tablet (12.5 mg total) by mouth 2 (two) times daily.  Dispense: 180 tablet; Refill: 3  2. Urge incontinence - Vibegron  (GEMTESA ) 75 MG TABS; Take 1 tablet (75 mg total) by mouth daily.  Dispense: 90 tablet; Refill: 3  3. Aortic atherosclerosis (HCC) - aspirin  EC 81 MG tablet; Take 1 tablet (81 mg total) by mouth daily.  Dispense: 90 tablet; Refill: 3  Camia Dipinto, MD

## 2024-01-04 ENCOUNTER — Ambulatory Visit: Admitting: Physical Therapy

## 2024-01-04 ENCOUNTER — Encounter: Payer: Self-pay | Admitting: Physical Therapy

## 2024-01-04 DIAGNOSIS — M5459 Other low back pain: Secondary | ICD-10-CM

## 2024-01-04 DIAGNOSIS — R262 Difficulty in walking, not elsewhere classified: Secondary | ICD-10-CM | POA: Diagnosis not present

## 2024-01-04 DIAGNOSIS — R2689 Other abnormalities of gait and mobility: Secondary | ICD-10-CM | POA: Diagnosis not present

## 2024-01-04 DIAGNOSIS — M6281 Muscle weakness (generalized): Secondary | ICD-10-CM | POA: Diagnosis not present

## 2024-01-04 DIAGNOSIS — G4733 Obstructive sleep apnea (adult) (pediatric): Secondary | ICD-10-CM | POA: Diagnosis not present

## 2024-01-04 DIAGNOSIS — M545 Low back pain, unspecified: Secondary | ICD-10-CM | POA: Diagnosis not present

## 2024-01-04 DIAGNOSIS — G8929 Other chronic pain: Secondary | ICD-10-CM | POA: Diagnosis not present

## 2024-01-11 ENCOUNTER — Other Ambulatory Visit (HOSPITAL_COMMUNITY): Payer: Self-pay

## 2024-01-11 ENCOUNTER — Other Ambulatory Visit: Payer: Self-pay | Admitting: Otolaryngology

## 2024-01-11 ENCOUNTER — Other Ambulatory Visit: Payer: Self-pay

## 2024-01-11 ENCOUNTER — Other Ambulatory Visit: Payer: Self-pay | Admitting: Psychiatry

## 2024-01-11 DIAGNOSIS — E041 Nontoxic single thyroid nodule: Secondary | ICD-10-CM

## 2024-01-11 DIAGNOSIS — F3176 Bipolar disorder, in full remission, most recent episode depressed: Secondary | ICD-10-CM

## 2024-01-12 ENCOUNTER — Other Ambulatory Visit: Payer: Self-pay

## 2024-01-15 ENCOUNTER — Telehealth (INDEPENDENT_AMBULATORY_CARE_PROVIDER_SITE_OTHER): Admitting: Psychiatry

## 2024-01-15 ENCOUNTER — Encounter: Payer: Self-pay | Admitting: Psychiatry

## 2024-01-15 DIAGNOSIS — F3176 Bipolar disorder, in full remission, most recent episode depressed: Secondary | ICD-10-CM | POA: Diagnosis not present

## 2024-01-15 DIAGNOSIS — F1021 Alcohol dependence, in remission: Secondary | ICD-10-CM

## 2024-01-15 DIAGNOSIS — F4310 Post-traumatic stress disorder, unspecified: Secondary | ICD-10-CM

## 2024-01-15 DIAGNOSIS — F17201 Nicotine dependence, unspecified, in remission: Secondary | ICD-10-CM

## 2024-01-15 DIAGNOSIS — F17200 Nicotine dependence, unspecified, uncomplicated: Secondary | ICD-10-CM

## 2024-01-15 DIAGNOSIS — F1421 Cocaine dependence, in remission: Secondary | ICD-10-CM

## 2024-01-15 MED ORDER — TRAZODONE HCL 50 MG PO TABS: 50.0000 mg | ORAL_TABLET | Freq: Every evening | ORAL | 2 refills | Status: DC | PRN

## 2024-01-15 NOTE — Progress Notes (Signed)
 Virtual Visit via Video Note  I connected with Susan [name] on 01/15/24 at  1:20 PM EDT by a video enabled telemedicine application and verified that I am speaking with the correct person using two identifiers.  Location Provider Location : [facility] Patient Location : Home  Participants: Patient , Provider   I discussed the limitations of evaluation and management by telemedicine and the availability of in person appointments. The patient expressed understanding and agreed to proceed.   I discussed the assessment and treatment plan with the patient. The patient was provided an opportunity to ask questions and all were answered. The patient agreed with the plan and demonstrated an understanding of the instructions.   The patient was advised to call back or seek an in-person evaluation if the symptoms worsen or if the condition fails to improve as anticipated.  BH MD OP Progress Note  01/15/2024 2:43 PM Susan [name]  MRN:  [id number]  Chief Complaint:  Chief Complaint  Patient presents with   Follow-up   Depression   Anxiety   Medication Refill   Discussed the use of AI scribe software for clinical note transcription with the patient, who gave verbal consent to proceed.  History of Present Illness Susan [name] is a 63 year old Caucasian female, currently unemployed, widowed, lives currently in [place] at [facility], a senior living community, has a history of bipolar disorder, PTSD, alcohol and cocaine use disorder in remission, tobacco use disorder, degenerative disc disease, obstructive sleep apnea on CPAP, history of total hip replacement, osteoarthritis, hepatitis C was evaluated by telemedicine today.  Since moving to a senior living apartment in [place] in mid-June, she reports doing well overall, though she describes some difficulty acclimating to the new environment and establishing a consistent routine. Engaging in social activities such as outings and  bingo, she has made friends at her new residence. She identifies her  daughter, son, and members of her AA group as her support system and continues to participate actively in [employer] and service work.  She describes increased difficulty with sleep since the move, stating that she has needed to use trazodone  more frequently to help her fall asleep. Currently, she takes trazodone  50 mg at bedtime as needed and reports that it effectively helps her sleep. She denies any current or recent mania or hypomanic symptoms. She denies any thoughts of harming herself or others.  Her current medication regimen includes Latuda  20 mg, gabapentin  600 mg twice daily, Cymbalta  50 mg daily, and trazodone  50 mg at bedtime as needed for sleep.  She denies any side effects or concerns.     Visit Diagnosis:    ICD-10-CM   1. Bipolar disorder, in full remission, most recent episode depressed (HCC)  F31.76     2. PTSD (post-traumatic stress disorder)  F43.10 traZODone  (DESYREL ) 50 MG tablet    3. Tobacco use disorder  F17.200    Moderate in early remission    4. Alcohol use disorder, severe, in sustained remission (HCC)  F10.21     5. Cocaine use disorder, moderate, in sustained remission (HCC)  F14.21       Past Psychiatric History: I have reviewed past psychiatric history from progress note on 09/27/2019.  Past trials of Seroquel, Cymbalta , Abilify, risperidone .  Past Medical History:  Past Medical History:  Diagnosis Date   ADD (attention deficit disorder)    Alcohol abuse    Anemia    Anxiety    Aortic atherosclerosis (HCC) 02/20/2018  Chest CT Sept 2019   Arthritis    rheumatoid arthritis   Asthma    Bipolar disorder (HCC)    Centrilobular emphysema (HCC)    Chronic diarrhea 07/22/2022   Cocaine use disorder, moderate, in sustained remission (HCC)    Constipation    Degenerative disc disease at L5-S1 level 09/28/2016   See ortho note May 2018   Depression    bipolar, hx of suicide  attempt   Diabetes mellitus, type 2 (HCC)    Diverticulitis 2013   DOE (dyspnea on exertion)    Drug use    Family history of adverse reaction to anesthesia    mom-delayed emergence   GERD (gastroesophageal reflux disease)    H/O suicide attempt    slit wrists   Hepatitis C 06/26/2014   treated with Harvoni   Hip pain    History of echocardiogram    a. 04/2019 Echo: EF >65%, nl RV fxn.   History of MRSA infection 2013   HLD (hyperlipidemia)    Hypertension    a. 06/2021 Renal Duplex: ? bilat RAS; b. 06/2021 CTA Abd: No signif RAS.   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   Status post total right knee replacement using cement 05/10/2016   Stroke (HCC) 07/05/2022   right arm,torso and right leg numbness   Tobacco use disorder    Tobacco use disorder, continuous 06/26/2014   Vitamin B12 deficiency    Vitamin D  deficiency 04/09/2015    Past Surgical History:  Procedure Laterality Date   BILATERAL SALPINGOOPHORECTOMY     due to abnormal mass   BREAST BIOPSY Left    neg   BREAST SURGERY Left 20 yrs ago   CESAREAN SECTION     COLONOSCOPY     COLONOSCOPY WITH PROPOFOL  N/A 10/16/2017   Procedure: COLONOSCOPY WITH PROPOFOL ;  Surgeon: Therisa Bi, MD;  Location: [facility] ENDOSCOPY;  Service: Gastroenterology;  Laterality: N/A;   COLONOSCOPY WITH PROPOFOL  N/A 02/16/2021   Procedure: COLONOSCOPY WITH PROPOFOL ;  Surgeon: Therisa Bi, MD;  Location: [facility] ENDOSCOPY;  Service: Gastroenterology;  Laterality: N/A;   FORAMINOTOMY 1 LEVEL Right 10/10/2022   Procedure: RIGHT L4-5 LAMINOFORAMINOTOMY;  Surgeon: Clois Fret, MD;   Location: [facility] ORS;  Service: Neurosurgery;  Laterality: Right;   HERNIA REPAIR  06/2011, July 2014   Ventral wall repair with Physiomesh   HERNIA REPAIR     2nd.vental wall repair   JOINT REPLACEMENT Right    knee   LUMBAR LAMINECTOMY/ DECOMPRESSION WITH MET-RX N/A 10/12/2022   Procedure: LUMBAR LAMINECTOMY/ DECOMPRESSION WITH MET-RX;  Surgeon: Clois Fret, MD;  Location: [facility] ORS;  Service: Neurosurgery;  Laterality: N/A;   TONSILLECTOMY     TOTAL HIP ARTHROPLASTY Left 07/23/2019   Procedure: TOTAL HIP ARTHROPLASTY;  Surgeon: Edie Norleen PARAS, MD;  Location: [facility] ORS;  Service: Orthopedics;  Laterality: Left;   TOTAL KNEE ARTHROPLASTY Right 05/10/2016   Procedure: TOTAL KNEE ARTHROPLASTY;  Surgeon: Norleen PARAS Edie, MD;  Location: [facility] ORS;  Service: Orthopedics;  Laterality: Right;   TUBAL LIGATION  Family Psychiatric History: Reviewed family psychiatric history from progress note on 09/27/2019.  Family History:  Family History  Problem Relation Age of Onset   Arthritis Mother    Asthma Mother    Mental illness Mother    Thyroid  disease Mother    COPD Mother    Heart disease Mother    Congestive Heart Failure Mother    Alcohol abuse Mother    Eating disorder Mother    Bipolar disorder Mother    Alcohol abuse Father    Heart attack Father    Alcohol abuse Sister    Drug abuse Sister    Mental illness Sister    Mental illness Sister    Fibromyalgia Sister    Obesity Sister    Pneumonia Sister    Depression Sister    Mental illness Sister    Alcohol abuse Sister    Drug abuse Sister    Arthritis Brother    Mental illness Brother    Cancer Brother        non-hodkins lymphoma   Drug abuse Daughter    Drug abuse Son    Alcohol abuse Son    Drug abuse Son    Alcohol abuse Son    Diabetes Neg Hx    Stroke Neg Hx    Breast cancer Neg Hx    Thyroid  cancer Neg Hx     Social History: Reviewed social history from progress note on 09/27/2019. Social History    Socioeconomic History   Marital status: Widowed    Spouse name: Not on file   Number of children: 3   Years of education: Not on file   Highest education level: Some college, no degree  Occupational History   Occupation: disabled  Tobacco Use   Smoking status: Every Day    Current packs/day: 0.50    Average packs/day: 0.5 packs/day for 48.7 years (24.3 ttl pk-yrs)    Types: Cigarettes    Start date: 40   Smokeless tobacco: Never  Vaping Use   Vaping status: Every Day   Substances: Nicotine   Substance and Sexual Activity   Alcohol use: No    Alcohol/week: 0.0 standard drinks of 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   Drug use: No    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   Sexual activity: Not Currently  Other Topics Concern   Not on file  Social History Narrative   Not on file   Social Drivers of Health   Financial Resource Strain: Low Risk  (03/29/2023)   Overall Financial Resource Strain (CARDIA)    Difficulty of Paying Living Expenses: Not hard at all  Food Insecurity: No Food Insecurity (03/29/2023)   Hunger Vital Sign    Worried About Running Out of Food in the Last Year: Never true    Ran Out of Food in the Last Year: Never true  Transportation Needs: No Transportation Needs (03/29/2023)   PRAPARE - Administrator, Civil Service (Medical): No    Lack of Transportation (Non-Medical): No  Physical Activity: Inactive (03/29/2023)   Exercise Vital Sign    Days of Exercise per Week: 0 days    Minutes of Exercise per Session: 0 min  Stress: No Stress Concern Present (03/29/2023)   [employer] of Occupational Health - Occupational Stress Questionnaire    Feeling of Stress : Only a little  Social Connections: Moderately Isolated (03/29/2023)   Social Connection and Isolation Panel    Frequency  of Communication with Friends and Family: Twice a week    Frequency of Social Gatherings with Friends and Family: More than three  times a week    Attends Religious Services: Never    Database administrator or Organizations: Yes    Attends Engineer, structural: More than 4 times per year    Marital Status: Widowed    Allergies:  Allergies  Allergen Reactions   Wellbutrin  [Bupropion ]     Patient reports made  deathly sick  years ago at appointment 03/18/20.   Furosemide Other (See Comments)    Electrolyte imbalance  Other Reaction(s): Other (See Comments)  Electrolyte imbalance Electrolyte imbalance Electrolyte imbalance    Electrolyte imbalance   Hydrochlorothiazide  Other (See Comments)    Electrolyte imbalance  Other Reaction(s): Dizziness, Other (See Comments)    Metabolic Disorder Labs: Lab Results  Component Value Date   HGBA1C 5.3 12/04/2023   MPG 105 12/04/2023   MPG 108 04/08/2019   No results found for: PROLACTIN Lab Results  Component Value Date   CHOL 98 12/04/2023   TRIG 87 12/04/2023   HDL 42 (L) 12/04/2023   CHOLHDL 2.3 12/04/2023   VLDL 33 07/06/2022   LDLCALC 39 12/04/2023   LDLCALC 128 (H) 07/06/2022   Lab Results  Component Value Date   TSH 1.06 03/28/2023   TSH 1.15 04/28/2021    Therapeutic Level Labs: No results found for: LITHIUM No results found for: VALPROATE No results found for: CBMZ  Current Medications: Current Outpatient Medications  Medication Sig Dispense Refill   acetaminophen  (TYLENOL ) 500 MG tablet Take 1,000 mg by mouth 2 (two) times daily.     aspirin  EC 81 MG tablet Take 1 tablet (81 mg total) by mouth daily. *Swallow whole* 30 tablet 6   bisacodyl  (ONELAX) 10 MG suppository Unwrap and insert 1 suppository (10 mg total) rectally as needed for moderate constipation. *If no bowel movement with morning colace.* 12 suppository 0   carvedilol  (COREG ) 12.5 MG tablet Take 1 tablet (12.5 mg total) by mouth 2 (two) times daily. 180 tablet 3   clotrimazole  (LOTRIMIN ) 1 % cream Apply 1 application  topically 2 (two) times daily as needed.  (Patient not taking: Reported on 01/01/2024)     cyanocobalamin  (VITAMIN B12) 100 MCG tablet Take 1 tablet (100 mcg total) by mouth daily. 90 tablet 3   cyclobenzaprine  (FLEXERIL ) 5 MG tablet Take 0.5-1 tablets (2.5-5 mg total) by mouth 2 (two) times daily as needed. 60 tablet 6   DULoxetine  (CYMBALTA ) 20 MG capsule Take 1 capsule (20 mg total) by mouth daily. *Take with 30 mg capsule* 30 capsule 7   DULoxetine  (CYMBALTA ) 30 MG capsule Take 1 capsule (30 mg total) by mouth daily. *Take with 20 mg capsule.* 30 capsule 9   gabapentin  (NEURONTIN ) 300 MG capsule Take 2 capsules (600 mg total) by mouth 2 (two) times daily. 360 capsule 1   HYDROcodone -acetaminophen  (NORCO/VICODIN) 5-325 MG tablet Take 1 tablet by mouth daily as needed for moderate pain.     lurasidone  (LATUDA ) 20 MG TABS tablet Take 1 tablet (20 mg total) by mouth daily with supper. 30 tablet 0   naloxone (NARCAN) nasal spray 4 mg/0.1 mL Place into the nose.     rosuvastatin  (CRESTOR ) 20 MG tablet Take 1 tablet (20 mg total) by mouth daily. 90 tablet 3   Semaglutide , 2 MG/DOSE, (OZEMPIC , 2 MG/DOSE,) 8 MG/3ML SOPN Inject 2 mg into the skin once a week. 3 mL 3  Semaglutide , 2 MG/DOSE, (OZEMPIC , 2 MG/DOSE,) 8 MG/3ML SOPN Inject 2mg  under the skin once a week 3 mL 9   Semaglutide , 2 MG/DOSE, (OZEMPIC , 2 MG/DOSE,) 8 MG/3ML SOPN Inject 2 mg into the skin once a week. 3 mL 6   telmisartan  (MICARDIS ) 80 MG tablet Take 1 tablet (80 mg total) by mouth daily. 30 tablet 5   traZODone  (DESYREL ) 50 MG tablet Take 1 tablet by mouth at bedtime as needed for sleep. for sleep 30 tablet 2   Vibegron  (GEMTESA ) 75 MG TABS Take 1 tablet (75 mg total) by mouth daily. 90 tablet 3   vitamin B-12 (CYANOCOBALAMIN ) 100 MCG tablet Take 1 tablet (100 mcg total) by mouth daily. 90 tablet 3   No current facility-administered medications for this visit.     Musculoskeletal: Strength & Muscle Tone: UTA Gait & Station: Seated Patient leans: N/A  Psychiatric  Specialty Exam: Review of Systems  Psychiatric/Behavioral:  Positive for sleep disturbance.     There were no vitals taken for this visit.There is no height or weight on file to calculate BMI.  General Appearance: Casual  Eye Contact:  Fair  Speech:  Clear and Coherent  Volume:  Normal  Mood:  Euthymic  Affect:  Appropriate  Thought Process:  Goal Directed and Descriptions of Associations: Intact  Orientation:  Full (Time, Place, and Person)  Thought Content: Logical   Suicidal Thoughts:  No  Homicidal Thoughts:  No  Memory:  Immediate;   Fair Recent;   Fair Remote;   Fair  Judgement:  Fair  Insight:  Fair  Psychomotor Activity:  Normal  Concentration:  Concentration: Fair and Attention Span: Fair  Recall:  [employer] of Knowledge: Fair  Language: Fair  Akathisia:  No  Handed:  Right  AIMS (if indicated): not done  Assets:  Communication Skills Desire for Improvement Housing Social Support Transportation  ADL's:  Intact  Cognition: WNL  Sleep:  improving   Screenings: Geneticist, molecular Office Visit from 10/09/2023 in [facility] Video Visit from 03/24/2022 in [facility] Video Visit from 01/20/2022 in [facility] Video Visit from 10/21/2021 in [facility]  AIMS Total Score 0 0 0 0   GAD-7    Flowsheet Row Office Visit from 12/22/2023 in [facility] [employer] Visit from 12/04/2023 in [facility] [employer] Visit from 11/03/2023 in [facility] [employer] Visit from 10/09/2023 in [facility] Office Visit from 03/28/2023 in [facility] [employer]  Total GAD-7 Score 0 1 1 5 5    PHQ2-9    Flowsheet Row Office Visit from 12/22/2023 in  [facility] Visit from 12/04/2023 in [facility] [employer] Visit from 11/03/2023 in [facility] [employer] Visit from 10/09/2023 in [facility] Clinical Support from 03/29/2023 in [facility] Total Score 0 0 1 1 1   PHQ-9 Total Score 1 1 6  -- 5   Flowsheet Row Video Visit from 01/15/2024 in [facility] ED from 11/17/2023 in [facility] Department at [facility] Visit from 10/09/2023 in [facility]  C-SSRS RISK CATEGORY Moderate Risk  No Risk Moderate Risk     Assessment and Plan: Susan [name] is a 63 year old Caucasian female with history of bipolar disorder, PTSD, alcoholism, cocaine use disorder in remission was evaluated by telemedicine today.  Discussed assessment and plan as noted below.  Bipolar disorder in remission Currently reports overall mood symptoms are stable. Continue Latuda  20 mg daily with supper  Posttraumatic stress disorder-stable Currently denies any significant concerns which are trauma related. Continue Cymbalta  50 mg daily Continue Trazodone  50 mg at bedtime as needed  Tobacco use disorder in remission/Alcohol and Cocaine use disorder in remission She continues to stay away from drugs, alcohol and smoking Continue AA meeting as needed.  I have reviewed labs dated 12/04/2023-hemoglobin A1c-5.3-within normal limits, lipid panel-HDL at 42 otherwise within normal limits.  Pending labs-TSH.  Follow-up Follow-up in clinic in 3 months or sooner if needed.   Consent: Patient/Guardian gives verbal consent for treatment and assignment of benefits for services provided during this visit. Patient/Guardian expressed understanding and agreed to proceed.   This note was  generated in part or whole with voice recognition software. Voice recognition is usually quite accurate but there are transcription errors that can and very often do occur. I apologize for any typographical errors that were not detected and corrected.    Sharolyn Weber, MD 01/15/2024, 2:43 PM

## 2024-01-16 ENCOUNTER — Ambulatory Visit: Attending: Neurosurgery | Admitting: Physical Therapy

## 2024-01-16 ENCOUNTER — Other Ambulatory Visit: Payer: Self-pay

## 2024-01-16 ENCOUNTER — Other Ambulatory Visit (HOSPITAL_COMMUNITY): Payer: Self-pay

## 2024-01-16 ENCOUNTER — Encounter: Payer: Self-pay | Admitting: Physical Therapy

## 2024-01-16 DIAGNOSIS — M6281 Muscle weakness (generalized): Secondary | ICD-10-CM | POA: Diagnosis not present

## 2024-01-16 DIAGNOSIS — M5459 Other low back pain: Secondary | ICD-10-CM | POA: Diagnosis not present

## 2024-01-16 DIAGNOSIS — R2689 Other abnormalities of gait and mobility: Secondary | ICD-10-CM | POA: Insufficient documentation

## 2024-01-16 DIAGNOSIS — R262 Difficulty in walking, not elsewhere classified: Secondary | ICD-10-CM | POA: Insufficient documentation

## 2024-01-16 NOTE — Progress Notes (Signed)
 Left message for patient to call our office back.

## 2024-01-16 NOTE — Therapy (Signed)
 OUTPATIENT PHYSICAL THERAPY THORACOLUMBAR AND BALANCE  TREATMENT   Patient Name: Susan [name] MRN: [id number] DOB:Feb 23, 1961, 63 y.o., female Today's Date: 01/16/2024  END OF SESSION:     Past Medical History:  Diagnosis Date   ADD (attention deficit disorder)    Alcohol abuse    Anemia    Anxiety    Aortic atherosclerosis (HCC) 02/20/2018   Chest CT Sept 2019   Arthritis    rheumatoid arthritis   Asthma    Bipolar disorder (HCC)    Centrilobular emphysema (HCC)    Chronic diarrhea 07/22/2022   Cocaine use disorder, moderate, in sustained remission (HCC)    Constipation    Degenerative disc disease at L5-S1 level 09/28/2016   See ortho note May 2018   Depression    bipolar, hx of suicide attempt   Diabetes mellitus, type 2 (HCC)    Diverticulitis 2013   DOE (dyspnea on exertion)    Drug use    Family history of adverse reaction to anesthesia    mom-delayed emergence   GERD (gastroesophageal reflux disease)    H/O suicide attempt    slit wrists   Hepatitis C 06/26/2014   treated with Harvoni   Hip pain    History of echocardiogram    a. 04/2019 Echo: EF >65%, nl RV fxn.   History of MRSA infection 2013   HLD (hyperlipidemia)    Hypertension    a. 06/2021 Renal Duplex: ? bilat RAS; b. 06/2021 CTA Abd: No signif RAS.   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   Status post total right knee replacement using cement 05/10/2016   Stroke (HCC) 07/05/2022   right arm,torso and right leg numbness   Tobacco use disorder    Tobacco use disorder, continuous 06/26/2014   Vitamin B12 deficiency    Vitamin D   deficiency 04/09/2015   Past Surgical History:  Procedure Laterality Date   BILATERAL SALPINGOOPHORECTOMY     due to abnormal mass   BREAST BIOPSY Left    neg   BREAST SURGERY Left 20 yrs ago   CESAREAN SECTION     COLONOSCOPY     COLONOSCOPY WITH PROPOFOL  N/A 10/16/2017   Procedure: COLONOSCOPY WITH PROPOFOL ;  Surgeon: Therisa Bi, MD;  Location: [facility] ENDOSCOPY;  Service: Gastroenterology;  Laterality: N/A;   COLONOSCOPY WITH PROPOFOL  N/A 02/16/2021   Procedure: COLONOSCOPY WITH PROPOFOL ;  Surgeon: Therisa Bi, MD;  Location: [facility] ENDOSCOPY;  Service: Gastroenterology;  Laterality: N/A;   FORAMINOTOMY 1 LEVEL Right 10/10/2022   Procedure: RIGHT L4-5 LAMINOFORAMINOTOMY;  Surgeon: Clois Fret, MD;  Location: [facility] ORS;  Service: Neurosurgery;  Laterality: Right;   HERNIA REPAIR  06/2011, July 2014   Ventral wall repair with Physiomesh   HERNIA REPAIR     2nd.vental wall repair   JOINT REPLACEMENT Right    knee   LUMBAR LAMINECTOMY/ DECOMPRESSION WITH MET-RX N/A 10/12/2022   Procedure: LUMBAR LAMINECTOMY/ DECOMPRESSION WITH MET-RX;  Surgeon: Clois Fret, MD;  Location: [facility] ORS;  Service: Neurosurgery;  Laterality: N/A;   TONSILLECTOMY  TOTAL HIP ARTHROPLASTY Left 07/23/2019   Procedure: TOTAL HIP ARTHROPLASTY;  Surgeon: Edie Norleen PARAS, MD;  Location: [facility] ORS;  Service: Orthopedics;  Laterality: Left;   TOTAL KNEE ARTHROPLASTY Right 05/10/2016   Procedure: TOTAL KNEE ARTHROPLASTY;  Surgeon: Norleen PARAS Edie, MD;  Location: [facility] ORS;  Service: Orthopedics;  Laterality: Right;   TUBAL LIGATION     Patient Active Problem List   Diagnosis Date Noted   Urge incontinence 12/22/2023   Painful and cold lower extremity 12/22/2023   Hemiplegia and hemiparesis following cerebral infarction affecting right dominant side (HCC) 12/04/2023   CKD (chronic kidney disease) stage 3, GFR 30-59 ml/min (HCC) 12/04/2023   Dysuria 11/03/2023   History of stroke 04/17/2023   Right leg weakness  04/17/2023   Neurogenic bladder 02/13/2023   Abnormality of gait and mobility 02/13/2023   Morbid obesity with body mass index (BMI) of 40.0 to 44.9 in adult (HCC) 01/10/2023   Left thyroid  nodule 01/10/2023   Bipolar 1 disorder, depressed, moderate (HCC) 11/29/2022   Normocytic normochromic anemia 10/13/2022   Epidural hematoma (HCC) 10/12/2022   Mass in epidural space 10/12/2022   Synovial cyst of lumbar facet joint 10/10/2022   Lumbar radiculopathy 10/10/2022   S/P laminectomy 10/10/2022   Epigastric pain 07/22/2022   Enlarged thyroid  07/22/2022   CVA (cerebrovascular accident) (HCC) 07/05/2022   Type II diabetes mellitus with nephropathy (HCC) 06/16/2021   H/O thyroid  nodule 05/31/2021   History of hepatitis C- treated 2014- 2015 with [facility] gastroenterology and followed.  05/19/2021   Left carotid bruit 04/28/2021   Bipolar disorder, in full remission, most recent episode depressed (HCC) 02/05/2021   Anxiety 05/28/2020   Bipolar affective disorder in remission (HCC) 05/28/2020   Bipolar disorder, in full remission, most recent episode mixed (HCC) 03/06/2020   Need for immunization against influenza 01/27/2020   Morbid obesity (HCC) 01/27/2020   Osteoporosis 11/06/2019   Bipolar 1 disorder, mixed, mild (HCC) 10/29/2019   PTSD (post-traumatic stress disorder) 09/27/2019   Bereavement 09/27/2019   Cocaine use disorder, moderate, in sustained remission (HCC) 09/27/2019   Primary osteoarthritis of left hip 06/14/2019   Allergic rhinitis 08/23/2018   Aortic atherosclerosis (HCC) 02/20/2018   Centrilobular emphysema (HCC) 02/20/2018   Hyperlipidemia 03/28/2017   Degenerative disc disease at L5-S1 level 09/28/2016   GERD (gastroesophageal reflux disease) 12/17/2015   Chronic back pain 10/09/2015   Post-traumatic osteoarthritis of right knee 09/24/2015   Alcohol use disorder, severe, in sustained remission (HCC) 04/13/2015   OSA on CPAP 04/09/2015   Subclinical hyperthyroidism  06/26/2014   Goiter colloid, toxic, nodular 06/26/2014   Hypertension 06/26/2014   Bipolar I disorder (HCC) 06/26/2014   Lumbar radiculitis 10/03/2013   Diverticulosis 08/24/2013    PCP: Dr. Luke Shade     REFERRING PROVIDER: Dr. Reeves Daisy    REFERRING DIAG: M54.50,G89.29 (ICD-10-CM) - Chronic bilateral low back pain without sciatica  Rationale for Evaluation and Treatment: Rehabilitation  THERAPY DIAG:  No diagnosis found.  ONSET DATE: 10/2022  SUBJECTIVE:  SUBJECTIVE STATEMENT:   Pt reports that she took her pain medication before the session and other than that she feels great. She is having some trouble extending her left knee for hamstring stretch and she attributes this to a Baker's cyst. She also reports noticing that her right leg feels heavier that usual and that this started occur 2-3 weeks ago. She also notes losing sensation in her right leg that radiates from her low back all the way down to bottom of the foot. She has stubbed her toes several times.   PERTINENT HISTORY:  She has prior history of left sided stroke affecting her right side in February 2024. She also had a L3-L4 lumbar decompression surgery in June 2024 with complications due to a epidural hematoma that resulted in cauda equinda syndrome and her needing to return to ER for evacuation and she was in the hospital for an additional 8 days following surgery.  She is just moving back to [place] from [place], because she has qualified for housing in [place]. She describes being most concerned with her balance especially when walking over uneven surfaces. She mainly uses a a 2WW when walking to places that she has to drive to because it is easier to transport. She has not had a fall in the past 6 mo, but she is scared  of falling especially when walking over uneven surfaces. She does report an improvement in LE function with her now being able to raise her toes and that she was not able to do this after surgery.   PAIN:  Are you having pain? Yes: NPRS scale: 5/10 Pain location: L1-L5 Central Spinous   Pain description: Dull   Aggravating factors: Tends to hurt more as the day goes on or the more active she is. Relieving factors: 5 x 325 Viacodin, Alieve  as needed     PRECAUTIONS: Fall; she is afraid of falling    RED FLAGS: None   WEIGHT BEARING RESTRICTIONS: No  FALLS:  Has patient fallen in last 6 months? No  LIVING ENVIRONMENT: Lives with: lives alone Lives in: House/apartment Stairs: Yes: External: 4 steps; on right going up and there is a ramp going to a house and there is a ramped main entrance leading to lobby. Place is called [employer]   Has following equipment at home: Single point cane, Environmental consultant - 2 wheeled, Environmental consultant - 4 wheeled, shower chair, Grab bars, and Ramped entry  OCCUPATION: Retired   PLOF: Independent  PATIENT GOALS: Pt wants to be able to walk outside using single point cane and to feel more confidence when walking especially over uneven surfaces. She currently uses rollator to walk dog at home and 2WW when walking anywhere where she has to drive.   NEXT MD VISIT: October 2025     OBJECTIVE:  Note: Objective measures were completed at Evaluation unless otherwise noted.  VITALS BP 118/66 HR  60 Sp02  97%  DIAGNOSTIC FINDINGS:  CLINICAL DATA:  Low back pain. Cauda equina syndrome suspected. Cyst removal from the spine two days ago. Postoperative urinary incontinence and bilateral leg numbness.   EXAM: MRI LUMBAR SPINE WITHOUT CONTRAST   TECHNIQUE: Multiplanar, multisequence MR imaging of the lumbar spine was performed. No intravenous contrast was administered.   COMPARISON:  05/18/2022   FINDINGS: Segmentation: 5 lumbar type vertebral bodies as numbered  previously.   Alignment: Scoliotic curvature convex to the right as seen previously. No significant listhesis.   Vertebrae: Edematous change of the endplates at  T10, T11 and T12, likely to relate to regional pain. This is probably degenerative. Lesser vertebral endplate edema noted at L3-4 and L4-5.   Conus medullaris and cauda equina: Conus extends to the L1 level. Conus and cauda equina appear normal.   Paraspinal and other soft tissues: Posterior surgical approach at the L4-5 level from the right-side has an expected appearance.   Disc levels:   At the surgical level and extending within the spinal canal from the upper L3 level to the L 4 5 disc level, there is a fluid collection which causes marked compression of the thecal sac. This is most severe at the level of the L3-4 disc where the fluid collection within the canal measures 1.8 x 1.5 cm in diameter and markedly flattens the thecal sac anteriorly.   Other than the surgical decompression of the synovial cyst on the right and the postoperative intraspinal fluid collection, there is no change since the preoperative exam, as one might expect.   IMPRESSION: 1. At the surgical level and extending from the upper L3 level to the L4-5 disc level, there is a postoperative fluid collection within the spinal canal which causes marked compression of the thecal sac anteriorly. This is most severe at the level of the L3-4 disc where the fluid collection within the canal measures 1.8 x 1.5 cm in diameter and markedly flattens the thecal sac anteriorly.     Electronically Signed   By: Oneil Officer M.D.   On: 10/12/2022 16:20  PATIENT SURVEYS:  ABC scale: The Activities-Specific Balance Confidence (ABC) Scale 0% 10 20 30  40 50 60 70 80 90 100% No confidence<->completely confident  "How confident are you that you will not lose your balance or become unsteady when you . . .   Date tested 01/01/24  Walk around the house 80%  2.  Walk up or down stairs 80%  3. Bend over and pick up a slipper from in front of a closet floor 60%  4. Reach for a small can off a shelf at eye level 90%  5. Stand on tip toes and reach for something above your head 0%  6. Stand on a chair and reach for something 40%  7. Sweep the floor 50%  8. Walk outside the house to a car parked in the driveway 90%  9. Get into or out of a car 80%  10. Walk across a parking lot to the mall 80%  11. Walk up or down a ramp 80%  12. Walk in a crowded mall where people rapidly walk past you 60%  13. Are bumped into by people as you walk through the mall 40%  14. Step onto or off of an escalator while you are holding onto the railing 50%  15. Step onto or off an escalator while holding onto parcels such that you cannot hold onto the railing 30%  16. Walk outside on icy sidewalks 0%  Total: #/16  57% (910/1600)       COGNITION: Overall cognitive status: Within functional limits for tasks assessed     SENSATION: Light touch: Impaired electricity down her RLE   MUSCLE LENGTH: Hamstrings: Not tested   Debby test: Not tested    POSTURE: rounded shoulders  PALPATION: Not performed    LUMBAR ROM:   AROM eval  Flexion 80%  Extension 100%  Right lateral flexion 100%  Left lateral flexion 100%  Right rotation 100%  Left rotation 100%   (Blank rows = not tested)  LOWER EXTREMITY ROM:     Active  Right eval Left eval  Hip flexion    Hip extension    Hip abduction    Hip adduction    Hip internal rotation    Hip external rotation    Knee flexion    Knee extension    Ankle dorsiflexion    Ankle plantarflexion    Ankle inversion    Ankle eversion     (Blank rows = not tested)  LOWER EXTREMITY MMT:    MMT Right eval Left eval  Hip flexion 4- 4  Hip extension    Hip abduction    Hip adduction    Hip internal rotation    Hip external rotation    Knee flexion 4 4  Knee extension 4 4  Ankle dorsiflexion 4 4  Ankle  plantarflexion    Ankle inversion    Ankle eversion     (Blank rows = not tested)    FUNCTIONAL TESTS:  5 times sit to stand: 14 sec  30 seconds chair stand test: 11 reps   Timed up and go (TUG): 15.38 sec with use of single point cane   6 minute walk test: 553 ft  10 meter walk test: 0.61 m/sec                 DGI:  13/26                       <=19/24  predictive of falls in the elderly     GAIT: Distance walked: 30 ft    Assistive device utilized: Walker - 2 wheeled Level of assistance: Modified independence Comments: Significant use of BUE support with decrease step length bilaterally.    TREATMENT DATE:   01/16/24 Physical Performance    Dynamic Gait Index: 13/24    -Mild Impairment - head turns  -Severe Impairment-  obstacle step over      Gait Training   Overground ambulation for 20 ft x 10 with use of single point cane in RUE    -Decreased step length on RLE  -min VC to perform heel to toe and to increase toe off      NMR   Step Taps on RLE onto 4 inch surface with BUE support 2 x 20   Step Tap on LLE onto 4 inch surface with BUE support 2 x 20      PATIENT EDUCATION:  Education details: Form and technique for correct performance of exercise and explanation about cut-offs for functional tests.  Person educated: Patient Education method: Explanation, Demonstration, Verbal cues, and Handouts Education comprehension: verbalized understanding, returned demonstration, and verbal cues required  HOME EXERCISE PROGRAM: Access Code: [email or web address] URL: [email or web address] Date: 01/16/2024 Prepared by: Toribio Servant  Exercises - Alternating Step Taps with Counter Support (Mirrored)  - 5-7 x weekly - 3 sets - 20 reps - Standing Hip Abduction with Counter Support  - 2-3 x daily - 5-7 x weekly - 3 sets - 10 reps - Standing Hip Extension with Counter Support  - 2-3 x daily - 5-7 x weekly - 3 sets - 10 reps - Heel Raises with Counter Support  - 2-3 x daily  - 5-7 x weekly - 3 sets - 10 reps  ASSESSMENT:  CLINICAL IMPRESSION:   Pt exhibits dynamic balance that indicates she is at increased risk for falls especially when stepping over objects. She continues to show decreased step length bilaterally and decreased toe off  bilaterally especially on RLE. PT is concerned about pt's report of decreased sensation in her RLE along with increased weakness that has been worsening over the past several weeks. PT to report these findings to the referring physician, Dr. Clois. She will continue to benefit from skilled PT to improve motor control and strength of lower extremities to improve dynamic balance and gait stability to avoid falling and to decrease level of assistive device to return to her prior level of function.    OBJECTIVE IMPAIRMENTS: Abnormal gait, decreased endurance, decreased ROM, decreased strength, impaired flexibility, impaired sensation, obesity, and pain.   ACTIVITY LIMITATIONS: carrying, lifting, bending, standing, squatting, stairs, dressing, hygiene/grooming, and locomotion level  PARTICIPATION LIMITATIONS: meal prep, cleaning, laundry, shopping, community activity, and yard work  PERSONAL FACTORS: Fitness, Past/current experiences, Time since onset of injury/illness/exacerbation, and 3+ comorbidities: h/o stroke with right sided deficits, L3-L5 decompression, HLD, HTN are also affecting patient's functional outcome.   REHAB POTENTIAL: Fair multiple co-morbidities and chronicity of condition  CLINICAL DECISION MAKING: Evolving/moderate complexity  EVALUATION COMPLEXITY: Moderate   GOALS: Goals reviewed with patient? No  SHORT TERM GOALS: Target date: 01/15/2024  Patient will demonstrate undestanding of home exercise plan by performing exercises correctly with evidence of good carry over with min to no verbal or tactile cues .   Baseline: NT  01/16/24: Performing Independently  Goal status: ACHIEVED     2.   Pt will increase  by at least 105m (154ft) in order to demonstrate clinically significant improvement in cardiopulmonary endurance and community ambulation Baseline: 8/28: 553' with RW and unable to complete 6 minutes (stopped at 5 minutes)   Goal status: ONGOING     3.  Patient will show decreased risk of falling as evidence by decrease in self-selected gait speed by >= 0.12 m/sec.  Baseline: 8/28: 0.61 m/s Goal status: ONGOING    4.  Pt will decrease 5TSTS by at least 3 seconds to demonstrate a minimal clinically significant improvement in LE strength.  Baseline: 14 seconds  Goal status: ONGOING     LONG TERM GOALS: Target date: 03/25/2024  Patient will improve activities specific balance scale score >=67% as evidence of an improvement self-perception of function and to decrease risk for falls Mae et al., 2022).  Baseline: 57% Goal status: ONGOING    2.  Patient will perform 5 x STS in <11.4 sec to demonstrate improve LE strength to decrease risk of falling (Bohannon, 2006). Baseline: 14 sec    Goal status: ONGOING     3.  Patient will complete >=12 reps on 30 sec chair stands to exhibit improved LE endurance to decrease risk of falling.  Baseline: 11 reps    Goal status: ONGOING    4.  Patient will ambulate >=1000 ft in to show community ambulator status to be able to have aerobic endurance to return to fulfilling walking tasks at his job.  Baseline: 8/28: 553' with RW and unable to complete 6 minutes (stopped at 5 minutes) Goal status: ONGOING    5.  Patient will demonstrate reduced falls risk as evidenced by Dynamic Gait Index (DGI) >19/24 to decrease risk of falling.  Baseline: 13/24 with use of single point cane in RUE    Goal status: ONGOING     6.  Patient will perform TUG in <13.5 sec as evidence of improved mobility and decreased risk for falling [facility] et al., 2000) Baseline:  8/28: 15.38 seconds with RW  Goal status: ONGOING  7. Patient will be able to  ambulate safely with the least restrictive assistive device outside as  evidenced by decreased lateral sway, consistent stride length and step length, and with knowledge of when to take a break due to fatigue.  Baseline: Using 2WW or 4WW Goal status: ONGOING     8. Patient will perform in >=1.0 m./sec as evidence of improved mobility, decreased falls risk, and overall functional independence.  Baseline: 8/28: 0.61 m/s  Goal status: ONGOING   PLAN:  PT FREQUENCY: 1-2x/week  PT DURATION: 12 weeks  PLANNED INTERVENTIONS: 97164- PT Re-evaluation, 97750- Physical Performance Testing, 97110-Therapeutic exercises, 97530- Therapeutic activity, [id number]- Neuromuscular re-education, 97535- Self Care, [id number]- Manual therapy, [phone]- Gait training, [phone]- Orthotic Initial, [phone]- Orthotic/Prosthetic subsequent, [phone]- Canalith repositioning, [phone]- Aquatic Therapy, [phone]- Electrical stimulation (unattended), [phone]- Electrical stimulation (manual), [id number]- Ultrasound, [id number] (1-2 muscles), [phone] (3+ muscles)- Dry Needling, Patient/Family education, Balance training, Stair training, Taping, Joint mobilization, Joint manipulation, Spinal manipulation, Spinal mobilization, Scar mobilization, Vestibular training, DME instructions, Cryotherapy, Moist heat, and Biofeedback.  PLAN FOR NEXT SESSION: Reassess short term goals.  Nu-Step for warm up. Femoral nerve glides on RLE. Assess muscle length: straight leg raise test. Add seated HS stretch and progress LE strengthening and motor control exercises.  Toribio Servant PT, DPT  [facility] [address], [place], [address] Phone: [phone]   Fax:  [phone]

## 2024-01-17 ENCOUNTER — Other Ambulatory Visit: Payer: Self-pay

## 2024-01-17 ENCOUNTER — Inpatient Hospital Stay
Admission: RE | Admit: 2024-01-17 | Discharge: 2024-01-17 | Discharge status: Home / Self Care | Source: Ambulatory Visit | Attending: Otolaryngology

## 2024-01-17 ENCOUNTER — Other Ambulatory Visit: Payer: Self-pay | Admitting: Psychiatry

## 2024-01-17 ENCOUNTER — Other Ambulatory Visit (HOSPITAL_COMMUNITY): Payer: Self-pay

## 2024-01-17 DIAGNOSIS — E041 Nontoxic single thyroid nodule: Secondary | ICD-10-CM

## 2024-01-17 DIAGNOSIS — E042 Nontoxic multinodular goiter: Secondary | ICD-10-CM | POA: Diagnosis not present

## 2024-01-17 DIAGNOSIS — F3176 Bipolar disorder, in full remission, most recent episode depressed: Secondary | ICD-10-CM

## 2024-01-17 MED ORDER — LURASIDONE HCL 20 MG PO TABS
20.0000 mg | ORAL_TABLET | Freq: Every day | ORAL | 2 refills | Status: DC
  Filled 2024-01-17: qty 30, 30d supply, fill #0
  Filled 2024-02-13: qty 30, 30d supply, fill #1
  Filled 2024-03-19: qty 30, 30d supply, fill #2

## 2024-01-18 ENCOUNTER — Other Ambulatory Visit (INDEPENDENT_AMBULATORY_CARE_PROVIDER_SITE_OTHER): Payer: Self-pay | Admitting: Nurse Practitioner

## 2024-01-18 DIAGNOSIS — M79606 Pain in leg, unspecified: Secondary | ICD-10-CM

## 2024-01-19 ENCOUNTER — Other Ambulatory Visit: Payer: Self-pay

## 2024-01-22 ENCOUNTER — Other Ambulatory Visit: Payer: Self-pay

## 2024-01-22 ENCOUNTER — Other Ambulatory Visit (HOSPITAL_COMMUNITY): Payer: Self-pay

## 2024-01-23 ENCOUNTER — Ambulatory Visit: Admitting: Physical Therapy

## 2024-01-24 ENCOUNTER — Ambulatory Visit (INDEPENDENT_AMBULATORY_CARE_PROVIDER_SITE_OTHER)

## 2024-01-24 ENCOUNTER — Ambulatory Visit (INDEPENDENT_AMBULATORY_CARE_PROVIDER_SITE_OTHER): Admitting: Nurse Practitioner

## 2024-01-24 ENCOUNTER — Encounter (INDEPENDENT_AMBULATORY_CARE_PROVIDER_SITE_OTHER): Payer: Self-pay | Admitting: Nurse Practitioner

## 2024-01-24 VITALS — BP 121/77 | HR 66 | Ht 62.0 in | Wt 224.0 lb

## 2024-01-24 DIAGNOSIS — R209 Unspecified disturbances of skin sensation: Secondary | ICD-10-CM

## 2024-01-24 DIAGNOSIS — E1121 Type 2 diabetes mellitus with diabetic nephropathy: Secondary | ICD-10-CM

## 2024-01-24 DIAGNOSIS — M79606 Pain in leg, unspecified: Secondary | ICD-10-CM

## 2024-01-25 ENCOUNTER — Ambulatory Visit: Admitting: Physical Therapy

## 2024-01-25 DIAGNOSIS — R262 Difficulty in walking, not elsewhere classified: Secondary | ICD-10-CM

## 2024-01-25 DIAGNOSIS — R2689 Other abnormalities of gait and mobility: Secondary | ICD-10-CM | POA: Diagnosis not present

## 2024-01-25 DIAGNOSIS — M6281 Muscle weakness (generalized): Secondary | ICD-10-CM | POA: Diagnosis not present

## 2024-01-25 DIAGNOSIS — M5459 Other low back pain: Secondary | ICD-10-CM

## 2024-01-25 NOTE — Therapy (Signed)
 OUTPATIENT PHYSICAL THERAPY THORACOLUMBAR AND BALANCE  TREATMENT   Patient Name: Susan [name] MRN: [id number] DOB:02-Jan-1961, 63 y.o., female Today's Date: 01/25/2024  END OF SESSION:  PT End of Session - 01/25/24 1021     Visit Number 4    Number of Visits 24    Date for Recertification  03/25/24    Authorization Type [facility] Medicare Dual 2025    Authorization - Visit Number 4    Authorization - Number of Visits 24    Progress Note Due on Visit 10    PT Start Time 1020    PT Stop Time 1100    PT Time Calculation (min) 40 min    Activity Tolerance Patient tolerated treatment well    Behavior During Therapy [facility] for tasks assessed/performed            Past Medical History:  Diagnosis Date   ADD (attention deficit disorder)    Alcohol abuse    Anemia    Anxiety    Aortic atherosclerosis (HCC) 02/20/2018   Chest CT Sept 2019   Arthritis    rheumatoid arthritis   Asthma    Bipolar disorder (HCC)    Centrilobular emphysema (HCC)    Chronic diarrhea 07/22/2022   Cocaine use disorder, moderate, in sustained remission (HCC)    Constipation    Degenerative disc disease at L5-S1 level 09/28/2016   See ortho note May 2018   Depression    bipolar, hx of suicide attempt   Diabetes mellitus, type 2 (HCC)    Diverticulitis 2013   DOE (dyspnea on exertion)    Drug use    Family history of adverse reaction to anesthesia    mom-delayed emergence   GERD (gastroesophageal reflux disease)    H/O suicide attempt    slit wrists   Hepatitis C 06/26/2014   treated with Harvoni   Hip pain    History of echocardiogram    a. 04/2019 Echo: EF >65%, nl RV fxn.   History of MRSA infection 2013   HLD (hyperlipidemia)    Hypertension    a. 06/2021 Renal Duplex: ? bilat RAS; b. 06/2021 CTA Abd: No signif RAS.   Hyponatremia    Hypothyroidism    Incisional hernia 11/08/2012   Knee pain    Left carotid bruit    Morbid obesity (HCC)    Multinodular thyroid     OSA (obstructive sleep  apnea)    OSA on CPAP    Osteoporosis    Palpitations    Post-traumatic osteoarthritis of right knee 09/24/2015   Recurrent ventral hernia 11/08/2012   Skin lesion 06/28/2022   Status post total hip replacement, left 07/23/2019   Status post total right knee replacement using cement 05/10/2016   Stroke (HCC) 07/05/2022   right arm,torso and right leg numbness   Tobacco use disorder    Tobacco use disorder, continuous 06/26/2014   Vitamin B12 deficiency    Vitamin D  deficiency 04/09/2015   Past Surgical History:  Procedure Laterality Date   BILATERAL SALPINGOOPHORECTOMY     due to abnormal mass   BREAST BIOPSY Left    neg   BREAST SURGERY Left 20 yrs ago   CESAREAN SECTION     COLONOSCOPY     COLONOSCOPY WITH PROPOFOL  N/A 10/16/2017   Procedure: COLONOSCOPY WITH PROPOFOL ;  Surgeon: Therisa Bi, MD;  Location: [facility] ENDOSCOPY;  Service: Gastroenterology;  Laterality: N/A;   COLONOSCOPY WITH PROPOFOL  N/A 02/16/2021   Procedure: COLONOSCOPY WITH PROPOFOL ;  Surgeon: Therisa,  Ruel, MD;  Location: [facility] ENDOSCOPY;  Service: Gastroenterology;  Laterality: N/A;   FORAMINOTOMY 1 LEVEL Right 10/10/2022   Procedure: RIGHT L4-5 LAMINOFORAMINOTOMY;  Surgeon: Clois Fret, MD;  Location: [facility] ORS;  Service: Neurosurgery;  Laterality: Right;   HERNIA REPAIR  06/2011, July 2014   Ventral wall repair with Physiomesh   HERNIA REPAIR     2nd.vental wall repair   JOINT REPLACEMENT Right    knee   LUMBAR LAMINECTOMY/ DECOMPRESSION WITH MET-RX N/A 10/12/2022   Procedure: LUMBAR LAMINECTOMY/ DECOMPRESSION WITH MET-RX;  Surgeon: Clois Fret, MD;  Location: [facility] ORS;  Service: Neurosurgery;  Laterality: N/A;   TONSILLECTOMY     TOTAL HIP ARTHROPLASTY Left 07/23/2019   Procedure: TOTAL HIP ARTHROPLASTY;  Surgeon: Edie Norleen PARAS, MD;  Location: [facility] ORS;  Service: Orthopedics;  Laterality: Left;   TOTAL KNEE ARTHROPLASTY Right 05/10/2016   Procedure: TOTAL KNEE ARTHROPLASTY;  Surgeon: Norleen PARAS Edie,  MD;  Location: [facility] ORS;  Service: Orthopedics;  Laterality: Right;   TUBAL LIGATION     Patient Active Problem List   Diagnosis Date Noted   Urge incontinence 12/22/2023   Painful and cold lower extremity 12/22/2023   Hemiplegia and hemiparesis following cerebral infarction affecting right dominant side (HCC) 12/04/2023   CKD (chronic kidney disease) stage 3, GFR 30-59 ml/min (HCC) 12/04/2023   Dysuria 11/03/2023   History of stroke 04/17/2023   Right leg weakness 04/17/2023   Neurogenic bladder 02/13/2023   Abnormality of gait and mobility 02/13/2023   Morbid obesity with body mass index (BMI) of 40.0 to 44.9 in adult (HCC) 01/10/2023   Left thyroid  nodule 01/10/2023   Bipolar 1 disorder, depressed, moderate (HCC) 11/29/2022   Normocytic normochromic anemia 10/13/2022   Epidural hematoma (HCC) 10/12/2022   Mass in epidural space 10/12/2022   Synovial cyst of lumbar facet joint 10/10/2022   Lumbar radiculopathy 10/10/2022   S/P laminectomy 10/10/2022   Epigastric pain 07/22/2022   Enlarged thyroid  07/22/2022   CVA (cerebrovascular accident) (HCC) 07/05/2022   Type II diabetes mellitus with nephropathy (HCC) 06/16/2021   H/O thyroid  nodule 05/31/2021   History of hepatitis C- treated 2014- 2015 with [facility] gastroenterology and followed.  05/19/2021   Left carotid bruit 04/28/2021   Bipolar disorder, in full remission, most recent episode depressed (HCC) 02/05/2021   Anxiety 05/28/2020   Bipolar affective disorder in remission (HCC) 05/28/2020   Bipolar disorder, in full remission, most recent episode mixed (HCC) 03/06/2020   Need for immunization against influenza 01/27/2020   Morbid obesity (HCC) 01/27/2020   Osteoporosis 11/06/2019   Bipolar 1 disorder, mixed, mild (HCC) 10/29/2019   PTSD (post-traumatic stress disorder) 09/27/2019   Bereavement 09/27/2019   Cocaine use disorder, moderate, in sustained remission (HCC) 09/27/2019   Primary osteoarthritis of left hip 06/14/2019    Allergic rhinitis 08/23/2018   Aortic atherosclerosis (HCC) 02/20/2018   Centrilobular emphysema (HCC) 02/20/2018   Hyperlipidemia 03/28/2017   Degenerative disc disease at L5-S1 level 09/28/2016   GERD (gastroesophageal reflux disease) 12/17/2015   Chronic back pain 10/09/2015   Post-traumatic osteoarthritis of right knee 09/24/2015   Alcohol use disorder, severe, in sustained remission (HCC) 04/13/2015   OSA on CPAP 04/09/2015   Subclinical hyperthyroidism 06/26/2014   Goiter colloid, toxic, nodular 06/26/2014   Hypertension 06/26/2014   Bipolar I disorder (HCC) 06/26/2014   Lumbar radiculitis 10/03/2013   Diverticulosis 08/24/2013    PCP: Dr. Luke Shade     REFERRING PROVIDER: Dr. Fret Clois    REFERRING DIAG:  M54.50,G89.29 (ICD-10-CM) - Chronic bilateral low back pain without sciatica  Rationale for Evaluation and Treatment: Rehabilitation  THERAPY DIAG:  Other low back pain  Imbalance  Difficulty in walking, not elsewhere classified  ONSET DATE: 10/2022  SUBJECTIVE:                                                                                                                                                                                           SUBJECTIVE STATEMENT:   Pt reports ongoing burning in the center of her lumbar spine and that her right leg gives out when walking from time to time. She usually has to stop and recollect herself and regain control of her leg. She describes it as he knee buckling.    PERTINENT HISTORY:  She has prior history of left sided stroke affecting her right side in February 2024. She also had a L3-L4 lumbar decompression surgery in June 2024 with complications due to a epidural hematoma that resulted in cauda equinda syndrome and her needing to return to ER for evacuation and she was in the hospital for an additional 8 days following surgery.  She is just moving back to [place] from [place], because she has qualified  for housing in [place]. She describes being most concerned with her balance especially when walking over uneven surfaces. She mainly uses a a 2WW when walking to places that she has to drive to because it is easier to transport. She has not had a fall in the past 6 mo, but she is scared of falling especially when walking over uneven surfaces. She does report an improvement in LE function with her now being able to raise her toes and that she was not able to do this after surgery.   PAIN:  Are you having pain? Yes: NPRS scale: 5/10 Pain location: L1-L5 Central Spinous   Pain description: Dull   Aggravating factors: Tends to hurt more as the day goes on or the more active she is. Relieving factors: 5 x 325 Viacodin, Alieve  as needed     PRECAUTIONS: Fall; she is afraid of falling    RED FLAGS: None   WEIGHT BEARING RESTRICTIONS: No  FALLS:  Has patient fallen in last 6 months? No  LIVING ENVIRONMENT: Lives with: lives alone Lives in: House/apartment Stairs: Yes: External: 4 steps; on right going up and there is a ramp going to a house and there is a ramped main entrance leading to lobby. Place is called [employer]   Has following equipment at home: Single point cane, Environmental consultant - 2 wheeled, Environmental consultant - 4 wheeled, shower chair, Grab bars, and Ramped  entry  OCCUPATION: Retired   PLOF: Independent  PATIENT GOALS: Pt wants to be able to walk outside using single point cane and to feel more confidence when walking especially over uneven surfaces. She currently uses rollator to walk dog at home and 2WW when walking anywhere where she has to drive.   NEXT MD VISIT: October 2025     OBJECTIVE:  Note: Objective measures were completed at Evaluation unless otherwise noted.  VITALS BP 118/66 HR  60 Sp02  97%  DIAGNOSTIC FINDINGS:  CLINICAL DATA:  Low back pain. Cauda equina syndrome suspected. Cyst removal from the spine two days ago. Postoperative urinary incontinence and bilateral  leg numbness.   EXAM: MRI LUMBAR SPINE WITHOUT CONTRAST   TECHNIQUE: Multiplanar, multisequence MR imaging of the lumbar spine was performed. No intravenous contrast was administered.   COMPARISON:  05/18/2022   FINDINGS: Segmentation: 5 lumbar type vertebral bodies as numbered previously.   Alignment: Scoliotic curvature convex to the right as seen previously. No significant listhesis.   Vertebrae: Edematous change of the endplates at T10, U88 and T12, likely to relate to regional pain. This is probably degenerative. Lesser vertebral endplate edema noted at L3-4 and L4-5.   Conus medullaris and cauda equina: Conus extends to the L1 level. Conus and cauda equina appear normal.   Paraspinal and other soft tissues: Posterior surgical approach at the L4-5 level from the right-side has an expected appearance.   Disc levels:   At the surgical level and extending within the spinal canal from the upper L3 level to the L 4 5 disc level, there is a fluid collection which causes marked compression of the thecal sac. This is most severe at the level of the L3-4 disc where the fluid collection within the canal measures 1.8 x 1.5 cm in diameter and markedly flattens the thecal sac anteriorly.   Other than the surgical decompression of the synovial cyst on the right and the postoperative intraspinal fluid collection, there is no change since the preoperative exam, as one might expect.   IMPRESSION: 1. At the surgical level and extending from the upper L3 level to the L4-5 disc level, there is a postoperative fluid collection within the spinal canal which causes marked compression of the thecal sac anteriorly. This is most severe at the level of the L3-4 disc where the fluid collection within the canal measures 1.8 x 1.5 cm in diameter and markedly flattens the thecal sac anteriorly.     Electronically Signed   By: Oneil Officer M.D.   On: 10/12/2022 16:20  PATIENT SURVEYS:  ABC  scale: The Activities-Specific Balance Confidence (ABC) Scale 0% 10 20 30  40 50 60 70 80 90 100% No confidence<->completely confident  "How confident are you that you will not lose your balance or become unsteady when you . . .   Date tested 01/01/24  Walk around the house 80%  2. Walk up or down stairs 80%  3. Bend over and pick up a slipper from in front of a closet floor 60%  4. Reach for a small can off a shelf at eye level 90%  5. Stand on tip toes and reach for something above your head 0%  6. Stand on a chair and reach for something 40%  7. Sweep the floor 50%  8. Walk outside the house to a car parked in the driveway 90%  9. Get into or out of a car 80%  10. Walk across a parking lot to  the mall 80%  11. Walk up or down a ramp 80%  12. Walk in a crowded mall where people rapidly walk past you 60%  13. Are bumped into by people as you walk through the mall 40%  14. Step onto or off of an escalator while you are holding onto the railing 50%  15. Step onto or off an escalator while holding onto parcels such that you cannot hold onto the railing 30%  16. Walk outside on icy sidewalks 0%  Total: #/16  57% (910/1600)       COGNITION: Overall cognitive status: Within functional limits for tasks assessed     SENSATION: Light touch: Impaired electricity down her RLE   MUSCLE LENGTH: Hamstrings: Not tested   Debby test: Not tested    POSTURE: rounded shoulders  PALPATION: Not performed    LUMBAR ROM:   AROM eval  Flexion 80%  Extension 100%  Right lateral flexion 100%  Left lateral flexion 100%  Right rotation 100%  Left rotation 100%   (Blank rows = not tested)  LOWER EXTREMITY ROM:     Active  Right eval Left eval  Hip flexion    Hip extension    Hip abduction    Hip adduction    Hip internal rotation    Hip external rotation    Knee flexion    Knee extension    Ankle dorsiflexion    Ankle plantarflexion    Ankle inversion    Ankle eversion      (Blank rows = not tested)  LOWER EXTREMITY MMT:    MMT Right eval Left eval  Hip flexion 4- 4  Hip extension    Hip abduction    Hip adduction    Hip internal rotation    Hip external rotation    Knee flexion 4 4  Knee extension 4 4  Ankle dorsiflexion 4 4  Ankle plantarflexion    Ankle inversion    Ankle eversion     (Blank rows = not tested)    FUNCTIONAL TESTS:  5 times sit to stand: 14 sec  30 seconds chair stand test: 11 reps   Timed up and go (TUG): 15.38 sec with use of single point cane   6 minute walk test: 553 ft  10 meter walk test: 0.61 m/sec                 DGI:  13/26                       <=19/24  predictive of falls in the elderly     GAIT: Distance walked: 30 ft    Assistive device utilized: Walker - 2 wheeled Level of assistance: Modified independence Comments: Significant use of BUE support with decrease step length bilaterally.    TREATMENT DATE:   01/25/24:  THERAC 30 ft overground ambulation x 6 with 2WW and BUE support   Standing RLE Ankle DF 2 x 10  -Pt reports decrease in n/t in right side of back     NMR: All performed on right foot   Ankle DF AROM 1 x 10   Great toe extension AROM 1 x 10   Ankle DF AROM  1 x 10   Right foot  Great to extension AROM 50 deg  Standing RLE Ankle DF 2 x 10  -Pt reports decrease in n/t in right side of back   Seated Femoral Nerve tensioner on RLE 2 x  10   -Pt does not report as much relief as when she performed in sitting    THEREX  Ankle AROM R/L 20/20   Knee Ext R/L 4+/4 Terminal Knee Extension on RLE with red band 1 x 10   Seated Long Arch Quad on RLE with red band 2 x 10   -min VC to hold for 2 sec at end range extension     PATIENT EDUCATION:  Education details: Form and technique for correct performance of exercise and explanation about cut-offs for functional tests.  Person educated: Patient Education method: Explanation, Demonstration, Verbal cues, and Handouts Education comprehension:  verbalized understanding, returned demonstration, and verbal cues required  HOME EXERCISE PROGRAM: Access Code: [email or web address] URL: [email or web address] Date: 01/25/2024 Prepared by: Toribio Servant  Exercises - Alternating Step Taps with Counter Support (Mirrored)  - 5-7 x weekly - 3 sets - 20 reps - Standing Hip Abduction with Counter Support  - 2-3 x daily - 5-7 x weekly - 3 sets - 10 reps - Standing Hip Extension with Counter Support  - 2-3 x daily - 5-7 x weekly - 3 sets - 10 reps - Seated Long Arc Quad  - 3-4 x weekly - 3 sets - 10 reps - Standing Ankle Dorsiflexion with Table Support  - 5-7 x weekly - 3 sets - 20 reps - Seated Slump Neural Tensioner  - 1 x daily - 7 x weekly - 1 sets - 10 reps  ASSESSMENT:  CLINICAL IMPRESSION:   Pt shows decreased motor control of ankle dorsiflexors and knee extensors on RLE which is likely the the result of ongoing nerve related issues. She has symmetrical ankle dorsiflexion AROM, so lack of mobility ruled out as potential cause here. She also exhibits decreased right sided lumbar radicular symptoms when performing femoral nerve tensioner in standing with ankle DF ROM and not as much relief in seated position. Pt does show decreased knee extensor strength on RLE compared to LLE but it is not significant and it is hard to see how this could be driving her RLE instability. Further testing needed here to clarify which movements or positions are resulting in RLE instability. She is following up with Dr. Clois to discuss worsening lumbar radicular symptoms and weakness in one week. She will continue to benefit from skilled PT to improve motor control and strength of lower extremities to improve dynamic balance and gait stability to avoid falling and to decrease level of assistive device to return to her prior level of function.   OBJECTIVE IMPAIRMENTS: Abnormal gait, decreased endurance, decreased ROM, decreased strength, impaired flexibility,  impaired sensation, obesity, and pain.   ACTIVITY LIMITATIONS: carrying, lifting, bending, standing, squatting, stairs, dressing, hygiene/grooming, and locomotion level  PARTICIPATION LIMITATIONS: meal prep, cleaning, laundry, shopping, community activity, and yard work  PERSONAL FACTORS: Fitness, Past/current experiences, Time since onset of injury/illness/exacerbation, and 3+ comorbidities: h/o stroke with right sided deficits, L3-L5 decompression, HLD, HTN are also affecting patient's functional outcome.   REHAB POTENTIAL: Fair multiple co-morbidities and chronicity of condition  CLINICAL DECISION MAKING: Evolving/moderate complexity  EVALUATION COMPLEXITY: Moderate   GOALS: Goals reviewed with patient? No  SHORT TERM GOALS: Target date: 01/15/2024  Patient will demonstrate undestanding of home exercise plan by performing exercises correctly with evidence of good carry over with min to no verbal or tactile cues .   Baseline: NT  01/16/24: Performing Independently  Goal status: ACHIEVED     2.   Pt will increase by at least 74m (  155ft) in order to demonstrate clinically significant improvement in cardiopulmonary endurance and community ambulation Baseline: 8/28: 553' with RW and unable to complete 6 minutes (stopped at 5 minutes)   Goal status: NOT MET     3.  Patient will show decreased risk of falling as evidence by decrease in self-selected gait speed by >= 0.12 m/sec.  Baseline: 8/28: 0.61 m/s Goal status: NOT MET    4.  Pt will decrease 5TSTS by at least 3 seconds to demonstrate a minimal clinically significant improvement in LE strength.  Baseline: 14 seconds  Goal status: NOT MET     LONG TERM GOALS: Target date: 03/25/2024  Patient will improve activities specific balance scale score >=67% as evidence of an improvement self-perception of function and to decrease risk for falls Mae et al., 2022).  Baseline: 57% Goal status: ONGOING    2.  Patient will perform  5 x STS in <11.4 sec to demonstrate improve LE strength to decrease risk of falling (Bohannon, 2006). Baseline: 14 sec    Goal status: ONGOING     3.  Patient will complete >=12 reps on 30 sec chair stands to exhibit improved LE endurance to decrease risk of falling.  Baseline: 11 reps    Goal status: ONGOING    4.  Patient will ambulate >=1000 ft in to show community ambulator status to be able to have aerobic endurance to return to fulfilling walking tasks at his job.  Baseline: 8/28: 553' with RW and unable to complete 6 minutes (stopped at 5 minutes) Goal status: ONGOING    5.  Patient will demonstrate reduced falls risk as evidenced by Dynamic Gait Index (DGI) >19/24 to decrease risk of falling.  Baseline: 13/24 with use of single point cane in RUE    Goal status: ONGOING     6.  Patient will perform TUG in <13.5 sec as evidence of improved mobility and decreased risk for falling [facility] et al., 2000) Baseline:  8/28: 15.38 seconds with RW  Goal status: ONGOING    7. Patient will be able to ambulate safely with the least restrictive assistive device outside as  evidenced by decreased lateral sway, consistent stride length and step length, and with knowledge of when to take a break due to fatigue.  Baseline: Using 2WW or 4WW Goal status: ONGOING     8. Patient will perform in >=1.0 m./sec as evidence of improved mobility, decreased falls risk, and overall functional independence.  Baseline: 8/28: 0.61 m/s  Goal status: ONGOING   PLAN:  PT FREQUENCY: 1-2x/week  PT DURATION: 12 weeks  PLANNED INTERVENTIONS: 97164- PT Re-evaluation, 97750- Physical Performance Testing, 97110-Therapeutic exercises, 97530- Therapeutic activity, [id number]- Neuromuscular re-education, 97535- Self Care, 02859- Manual therapy, [phone]- Gait training, [phone]- Orthotic Initial, [phone]- Orthotic/Prosthetic subsequent, [phone]- Canalith repositioning, [phone]- Aquatic Therapy, [phone]- Electrical  stimulation (unattended), [phone]- Electrical stimulation (manual), [id number]- Ultrasound, 79439 (1-2 muscles), [phone] (3+ muscles)- Dry Needling, Patient/Family education, Balance training, Stair training, Taping, Joint mobilization, Joint manipulation, Spinal manipulation, Spinal mobilization, Scar mobilization, Vestibular training, DME instructions, Cryotherapy, Moist heat, and Biofeedback.  PLAN FOR NEXT SESSION: Reassess goals.  Assess muscle length: straight leg raise test. Add seated HS stretch and progress LE strengthening and motor control exercises in standing positions and spend more time with gait analysis.   Toribio Servant PT, DPT  [facility] [address], [place], [address] Phone: [phone]   Fax:  [phone]

## 2024-01-29 DIAGNOSIS — E042 Nontoxic multinodular goiter: Secondary | ICD-10-CM | POA: Diagnosis not present

## 2024-01-29 LAB — VAS US ABI WITH/WO TBI
Left ABI: 1.19
Right ABI: 1.16

## 2024-01-30 ENCOUNTER — Ambulatory Visit: Admitting: Physical Therapy

## 2024-01-30 ENCOUNTER — Ambulatory Visit
Admission: RE | Admit: 2024-01-30 | Discharge: 2024-01-30 | Disposition: A | Discharge status: Home / Self Care | Source: Ambulatory Visit | Attending: Neurosurgery | Admitting: Neurosurgery

## 2024-01-30 ENCOUNTER — Ambulatory Visit (INDEPENDENT_AMBULATORY_CARE_PROVIDER_SITE_OTHER): Admitting: Neurosurgery

## 2024-01-30 ENCOUNTER — Other Ambulatory Visit: Payer: Self-pay

## 2024-01-30 ENCOUNTER — Ambulatory Visit
Admission: RE | Admit: 2024-01-30 | Discharge: 2024-01-30 | Disposition: A | Discharge status: Home / Self Care | Attending: Neurosurgery | Admitting: Neurosurgery

## 2024-01-30 ENCOUNTER — Encounter: Payer: Self-pay | Admitting: Neurosurgery

## 2024-01-30 VITALS — BP 152/90 | Ht 62.0 in | Wt 224.0 lb

## 2024-01-30 DIAGNOSIS — M5136 Other intervertebral disc degeneration, lumbar region with discogenic back pain only: Secondary | ICD-10-CM | POA: Diagnosis not present

## 2024-01-30 DIAGNOSIS — M545 Low back pain, unspecified: Secondary | ICD-10-CM | POA: Insufficient documentation

## 2024-01-30 DIAGNOSIS — G8929 Other chronic pain: Secondary | ICD-10-CM | POA: Insufficient documentation

## 2024-01-30 DIAGNOSIS — M961 Postlaminectomy syndrome, not elsewhere classified: Secondary | ICD-10-CM

## 2024-01-30 DIAGNOSIS — M4187 Other forms of scoliosis, lumbosacral region: Secondary | ICD-10-CM | POA: Diagnosis not present

## 2024-01-30 DIAGNOSIS — R262 Difficulty in walking, not elsewhere classified: Secondary | ICD-10-CM | POA: Diagnosis not present

## 2024-01-30 DIAGNOSIS — M419 Scoliosis, unspecified: Secondary | ICD-10-CM | POA: Diagnosis not present

## 2024-01-30 DIAGNOSIS — R52 Pain, unspecified: Secondary | ICD-10-CM | POA: Diagnosis not present

## 2024-01-30 NOTE — Patient Instructions (Signed)
 [facility] - televisits [phone] Not in network with [facility] 20[facility] - televisits [phone] Not in network with [facility] 2024 [facility] in [place] Provider: Duwaine [place], [place] [phone] Accepts [facility] Dr Corina in [place] [phone] Accepts [facility] [employer], but is typically booked out about 10 months Dr Achilles in [place] [phone]  [facility] (offices in [place], [place], and [place]) [phone]   Please let us  know which one of the above psychologist's you would like to see for evaluation prior to the spinal cord stimulator trial. These are the only providers we are aware of that perform this type of evaluation. Once we fax the referral, please call them to set up an appointment (they do not typically call you).

## 2024-01-30 NOTE — Progress Notes (Signed)
   REFERRING PHYSICIAN:  Abbey Bruckner, Md [address] [place],  [place] [address]   DOS: 10/12/22 L3-5 hematoma evacuation DOS:   10/10/22 L3-5 lumbar decompression   HISTORY OF PRESENT ILLNESS: Susan [name] is status post L3-5 lumbar decompression with evacuation of epidural hematoma.   She continues to have issues with walking and pain.  She returns to see me due to worsening trouble at PT.    PHYSICAL EXAMINATION:  Vitals:   01/30/24 1435  BP: (!) 152/90   4+-5/5 strength in BLE  Walking with walker   ROS (Neurologic):  Negative except as noted above  IMAGING: Nothing new to review.   ASSESSMENT/PLAN:  [name] continues to have issues.  We discussed imaging workup, but she requested to focus on smaller procedures such as spinal cord stimulation.  She may be a candidate for scoliosis intervention, but does not wish to consider such a significant intervention at this time.  Will send her for thoracic MRI scan in addition to psychology evaluation.  I will refer her to pain management for spinal cord stimulator evaluation.  She may be a very good candidate for this.  I spent a total of 10 minutes in this patient's care today. This time was spent reviewing pertinent records including imaging studies, obtaining and confirming history, performing a directed evaluation, formulating and discussing my recommendations, and documenting the visit within the medical record.    Reeves Daisy MD Department of neurosurgery

## 2024-01-31 ENCOUNTER — Telehealth: Payer: Self-pay | Admitting: Psychiatry

## 2024-01-31 ENCOUNTER — Telehealth: Payer: Self-pay | Admitting: Neurosurgery

## 2024-01-31 NOTE — Telephone Encounter (Signed)
 Please let patient know to ask her surgeon to send us  information about what they are requesting specifically. Since in the past ' for spinal cord stimulator clearance' patients were being cleared by psychologist and I have not done those types of clearance.

## 2024-01-31 NOTE — Telephone Encounter (Signed)
 No , I will defer to a psychologist who does this type of clearance

## 2024-01-31 NOTE — Telephone Encounter (Signed)
 Pt. Says that they spoke with their Psychiatrist Dr. Gerrianne Barth, and they wanted a call to see what they need to send over to clear her for surgery.

## 2024-02-01 ENCOUNTER — Ambulatory Visit: Admitting: Physical Therapy

## 2024-02-01 DIAGNOSIS — R262 Difficulty in walking, not elsewhere classified: Secondary | ICD-10-CM | POA: Diagnosis not present

## 2024-02-01 DIAGNOSIS — M6281 Muscle weakness (generalized): Secondary | ICD-10-CM | POA: Diagnosis not present

## 2024-02-01 DIAGNOSIS — M5459 Other low back pain: Secondary | ICD-10-CM | POA: Diagnosis not present

## 2024-02-01 DIAGNOSIS — R2689 Other abnormalities of gait and mobility: Secondary | ICD-10-CM | POA: Diagnosis not present

## 2024-02-01 NOTE — Therapy (Signed)
 OUTPATIENT PHYSICAL THERAPY THORACOLUMBAR AND BALANCE  TREATMENT   Patient Name: Susan [name] MRN: [id number] DOB:02-28-1961, 63 y.o., female Today's Date: 02/01/2024  END OF SESSION:  PT End of Session - 02/01/24 1350     Visit Number 5    Number of Visits 24    Date for Recertification  03/25/24    Authorization Type [facility] Medicare Dual 2025    Authorization - Visit Number 5    Authorization - Number of Visits 24    Progress Note Due on Visit 10    PT Start Time 1345    PT Stop Time 1430    PT Time Calculation (min) 45 min    Activity Tolerance Patient tolerated treatment well    Behavior During Therapy [facility] for tasks assessed/performed            Past Medical History:  Diagnosis Date   ADD (attention deficit disorder)    Alcohol abuse    Anemia    Anxiety    Aortic atherosclerosis 02/20/2018   Chest CT Sept 2019   Arthritis    rheumatoid arthritis   Asthma    Bipolar disorder (HCC)    Centrilobular emphysema (HCC)    Chronic diarrhea 07/22/2022   Cocaine use disorder, moderate, in sustained remission (HCC)    Constipation    Degenerative disc disease at L5-S1 level 09/28/2016   See ortho note May 2018   Depression    bipolar, hx of suicide attempt   Diabetes mellitus, type 2 (HCC)    Diverticulitis 2013   DOE (dyspnea on exertion)    Drug use    Family history of adverse reaction to anesthesia    mom-delayed emergence   GERD (gastroesophageal reflux disease)    H/O suicide attempt    slit wrists   Hepatitis C 06/26/2014   treated with Harvoni   Hip pain    History of echocardiogram    a. 04/2019 Echo: EF >65%, nl RV fxn.   History of MRSA infection 2013   HLD (hyperlipidemia)    Hypertension    a. 06/2021 Renal Duplex: ? bilat RAS; b. 06/2021 CTA Abd: No signif RAS.   Hyponatremia    Hypothyroidism    Incisional hernia 11/08/2012   Knee pain    Left carotid bruit    Morbid obesity (HCC)    Multinodular thyroid     OSA (obstructive sleep apnea)     OSA on CPAP    Osteoporosis    Palpitations    Post-traumatic osteoarthritis of right knee 09/24/2015   Recurrent ventral hernia 11/08/2012   Skin lesion 06/28/2022   Status post total hip replacement, left 07/23/2019   Status post total right knee replacement using cement 05/10/2016   Stroke (HCC) 07/05/2022   right arm,torso and right leg numbness   Tobacco use disorder    Tobacco use disorder, continuous 06/26/2014   Vitamin B12 deficiency    Vitamin D  deficiency 04/09/2015   Past Surgical History:  Procedure Laterality Date   BILATERAL SALPINGOOPHORECTOMY     due to abnormal mass   BREAST BIOPSY Left    neg   BREAST SURGERY Left 20 yrs ago   CESAREAN SECTION     COLONOSCOPY     COLONOSCOPY WITH PROPOFOL  N/A 10/16/2017   Procedure: COLONOSCOPY WITH PROPOFOL ;  Surgeon: Therisa Bi, MD;  Location: [facility] ENDOSCOPY;  Service: Gastroenterology;  Laterality: N/A;   COLONOSCOPY WITH PROPOFOL  N/A 02/16/2021   Procedure: COLONOSCOPY WITH PROPOFOL ;  Surgeon: Therisa Bi,  MD;  Location: [facility] ENDOSCOPY;  Service: Gastroenterology;  Laterality: N/A;   FORAMINOTOMY 1 LEVEL Right 10/10/2022   Procedure: RIGHT L4-5 LAMINOFORAMINOTOMY;  Surgeon: Clois Fret, MD;  Location: [facility] ORS;  Service: Neurosurgery;  Laterality: Right;   HERNIA REPAIR  06/2011, July 2014   Ventral wall repair with Physiomesh   HERNIA REPAIR     2nd.vental wall repair   JOINT REPLACEMENT Right    knee   LUMBAR LAMINECTOMY/ DECOMPRESSION WITH MET-RX N/A 10/12/2022   Procedure: LUMBAR LAMINECTOMY/ DECOMPRESSION WITH MET-RX;  Surgeon: Clois Fret, MD;  Location: [facility] ORS;  Service: Neurosurgery;  Laterality: N/A;   TONSILLECTOMY     TOTAL HIP ARTHROPLASTY Left 07/23/2019   Procedure: TOTAL HIP ARTHROPLASTY;  Surgeon: Edie Norleen PARAS, MD;  Location: [facility] ORS;  Service: Orthopedics;  Laterality: Left;   TOTAL KNEE ARTHROPLASTY Right 05/10/2016   Procedure: TOTAL KNEE ARTHROPLASTY;  Surgeon: Norleen PARAS Edie, MD;   Location: [facility] ORS;  Service: Orthopedics;  Laterality: Right;   TUBAL LIGATION     Patient Active Problem List   Diagnosis Date Noted   Urge incontinence 12/22/2023   Painful and cold lower extremity 12/22/2023   Hemiplegia and hemiparesis following cerebral infarction affecting right dominant side (HCC) 12/04/2023   CKD (chronic kidney disease) stage 3, GFR 30-59 ml/min (HCC) 12/04/2023   Dysuria 11/03/2023   History of stroke 04/17/2023   Right leg weakness 04/17/2023   Neurogenic bladder 02/13/2023   Abnormality of gait and mobility 02/13/2023   Morbid obesity with body mass index (BMI) of 40.0 to 44.9 in adult (HCC) 01/10/2023   Left thyroid  nodule 01/10/2023   Bipolar 1 disorder, depressed, moderate (HCC) 11/29/2022   Normocytic normochromic anemia 10/13/2022   Epidural hematoma (HCC) 10/12/2022   Mass in epidural space 10/12/2022   Synovial cyst of lumbar facet joint 10/10/2022   Lumbar radiculopathy 10/10/2022   S/P laminectomy 10/10/2022   Epigastric pain 07/22/2022   Enlarged thyroid  07/22/2022   CVA (cerebrovascular accident) (HCC) 07/05/2022   Type II diabetes mellitus with nephropathy (HCC) 06/16/2021   H/O thyroid  nodule 05/31/2021   History of hepatitis C- treated 2014- 2015 with [facility] gastroenterology and followed.  05/19/2021   Left carotid bruit 04/28/2021   Bipolar disorder, in full remission, most recent episode depressed 02/05/2021   Anxiety 05/28/2020   Bipolar affective disorder in remission 05/28/2020   Bipolar disorder, in full remission, most recent episode mixed 03/06/2020   Need for immunization against influenza 01/27/2020   Morbid obesity (HCC) 01/27/2020   Osteoporosis 11/06/2019   Bipolar 1 disorder, mixed, mild (HCC) 10/29/2019   PTSD (post-traumatic stress disorder) 09/27/2019   Bereavement 09/27/2019   Cocaine use disorder, moderate, in sustained remission (HCC) 09/27/2019   Primary osteoarthritis of left hip 06/14/2019   Allergic rhinitis  08/23/2018   Aortic atherosclerosis 02/20/2018   Centrilobular emphysema (HCC) 02/20/2018   Hyperlipidemia 03/28/2017   Degenerative disc disease at L5-S1 level 09/28/2016   GERD (gastroesophageal reflux disease) 12/17/2015   Chronic back pain 10/09/2015   Post-traumatic osteoarthritis of right knee 09/24/2015   Alcohol use disorder, severe, in sustained remission (HCC) 04/13/2015   OSA on CPAP 04/09/2015   Subclinical hyperthyroidism 06/26/2014   Goiter colloid, toxic, nodular 06/26/2014   Hypertension 06/26/2014   Bipolar I disorder (HCC) 06/26/2014   Lumbar radiculitis 10/03/2013   Diverticulosis 08/24/2013    PCP: Dr. Luke Shade     REFERRING PROVIDER: Dr. Fret Clois    REFERRING DIAG: [phone] (ICD-10-CM) - Chronic bilateral  low back pain without sciatica  Rationale for Evaluation and Treatment: Rehabilitation  THERAPY DIAG:  Other low back pain  Imbalance  Difficulty in walking, not elsewhere classified  ONSET DATE: 10/2022  SUBJECTIVE:                                                                                                                                                                                           SUBJECTIVE STATEMENT:   Pt reports having increased soreness in her spine and she attributes this to the rain coming.    PERTINENT HISTORY:  She has prior history of left sided stroke affecting her right side in February 2024. She also had a L3-L4 lumbar decompression surgery in June 2024 with complications due to a epidural hematoma that resulted in cauda equinda syndrome and her needing to return to ER for evacuation and she was in the hospital for an additional 8 days following surgery.  She is just moving back to [place] from [place], because she has qualified for housing in [place]. She describes being most concerned with her balance especially when walking over uneven surfaces. She mainly uses a a 2WW when walking to places  that she has to drive to because it is easier to transport. She has not had a fall in the past 6 mo, but she is scared of falling especially when walking over uneven surfaces. She does report an improvement in LE function with her now being able to raise her toes and that she was not able to do this after surgery.   PAIN:  Are you having pain? Yes: NPRS scale: 8/10 NRPS on central spinous process of L1-L5    Pain location: L1-L5 Central Spinous   Pain description: Dull   Aggravating factors: Tends to hurt more as the day goes on or the more active she is. Relieving factors: 5 x 325 Viacodin, Alieve  as needed     PRECAUTIONS: Fall; she is afraid of falling    RED FLAGS: None   WEIGHT BEARING RESTRICTIONS: No  FALLS:  Has patient fallen in last 6 months? No  LIVING ENVIRONMENT: Lives with: lives alone Lives in: House/apartment Stairs: Yes: External: 4 steps; on right going up and there is a ramp going to a house and there is a ramped main entrance leading to lobby. Place is called [employer]   Has following equipment at home: Single point cane, Environmental consultant - 2 wheeled, Environmental consultant - 4 wheeled, shower chair, Grab bars, and Ramped entry  OCCUPATION: Retired   PLOF: Independent  PATIENT GOALS: Pt wants to be able to walk outside using single point cane and  to feel more confidence when walking especially over uneven surfaces. She currently uses rollator to walk dog at home and 2WW when walking anywhere where she has to drive.   NEXT MD VISIT: October 2025     OBJECTIVE:  Note: Objective measures were completed at Evaluation unless otherwise noted.  VITALS BP 118/66 HR  60 Sp02  97%  DIAGNOSTIC FINDINGS:  CLINICAL DATA:  Low back pain. Cauda equina syndrome suspected. Cyst removal from the spine two days ago. Postoperative urinary incontinence and bilateral leg numbness.   EXAM: MRI LUMBAR SPINE WITHOUT CONTRAST   TECHNIQUE: Multiplanar, multisequence MR imaging of the lumbar  spine was performed. No intravenous contrast was administered.   COMPARISON:  05/18/2022   FINDINGS: Segmentation: 5 lumbar type vertebral bodies as numbered previously.   Alignment: Scoliotic curvature convex to the right as seen previously. No significant listhesis.   Vertebrae: Edematous change of the endplates at T10, U88 and T12, likely to relate to regional pain. This is probably degenerative. Lesser vertebral endplate edema noted at L3-4 and L4-5.   Conus medullaris and cauda equina: Conus extends to the L1 level. Conus and cauda equina appear normal.   Paraspinal and other soft tissues: Posterior surgical approach at the L4-5 level from the right-side has an expected appearance.   Disc levels:   At the surgical level and extending within the spinal canal from the upper L3 level to the L 4 5 disc level, there is a fluid collection which causes marked compression of the thecal sac. This is most severe at the level of the L3-4 disc where the fluid collection within the canal measures 1.8 x 1.5 cm in diameter and markedly flattens the thecal sac anteriorly.   Other than the surgical decompression of the synovial cyst on the right and the postoperative intraspinal fluid collection, there is no change since the preoperative exam, as one might expect.   IMPRESSION: 1. At the surgical level and extending from the upper L3 level to the L4-5 disc level, there is a postoperative fluid collection within the spinal canal which causes marked compression of the thecal sac anteriorly. This is most severe at the level of the L3-4 disc where the fluid collection within the canal measures 1.8 x 1.5 cm in diameter and markedly flattens the thecal sac anteriorly.     Electronically Signed   By: Oneil Officer M.D.   On: 10/12/2022 16:20  PATIENT SURVEYS:  ABC scale: The Activities-Specific Balance Confidence (ABC) Scale 0% 10 20 30  40 50 60 70 80 90 100% No confidence<->completely  confident  "How confident are you that you will not lose your balance or become unsteady when you . . .   Date tested 01/01/24  Walk around the house 80%  2. Walk up or down stairs 80%  3. Bend over and pick up a slipper from in front of a closet floor 60%  4. Reach for a small can off a shelf at eye level 90%  5. Stand on tip toes and reach for something above your head 0%  6. Stand on a chair and reach for something 40%  7. Sweep the floor 50%  8. Walk outside the house to a car parked in the driveway 90%  9. Get into or out of a car 80%  10. Walk across a parking lot to the mall 80%  11. Walk up or down a ramp 80%  12. Walk in a crowded mall where people rapidly walk past  you 60%  13. Are bumped into by people as you walk through the mall 40%  14. Step onto or off of an escalator while you are holding onto the railing 50%  15. Step onto or off an escalator while holding onto parcels such that you cannot hold onto the railing 30%  16. Walk outside on icy sidewalks 0%  Total: #/16  57% (910/1600)       COGNITION: Overall cognitive status: Within functional limits for tasks assessed     SENSATION: Light touch: Impaired electricity down her RLE   MUSCLE LENGTH: Hamstrings: Not tested   Debby test: Not tested    POSTURE: rounded shoulders  PALPATION: Not performed    LUMBAR ROM:   AROM eval  Flexion 80%  Extension 100%  Right lateral flexion 100%  Left lateral flexion 100%  Right rotation 100%  Left rotation 100%   (Blank rows = not tested)  LOWER EXTREMITY ROM:     Active  Right eval Left eval  Hip flexion    Hip extension    Hip abduction    Hip adduction    Hip internal rotation    Hip external rotation    Knee flexion    Knee extension    Ankle dorsiflexion    Ankle plantarflexion    Ankle inversion    Ankle eversion     (Blank rows = not tested)  LOWER EXTREMITY MMT:    MMT Right eval Left eval  Hip flexion 4- 4  Hip extension    Hip  abduction    Hip adduction    Hip internal rotation    Hip external rotation    Knee flexion 4 4  Knee extension 4 4  Ankle dorsiflexion 4 4  Ankle plantarflexion    Ankle inversion    Ankle eversion     (Blank rows = not tested)    FUNCTIONAL TESTS:  5 times sit to stand: 14 sec  30 seconds chair stand test: 11 reps   Timed up and go (TUG): 15.38 sec with use of single point cane   6 minute walk test: 553 ft  10 meter walk test: 0.61 m/sec                 DGI:  13/26                       <=19/24  predictive of falls in the elderly     GAIT: Distance walked: 30 ft    Assistive device utilized: Walker - 2 wheeled Level of assistance: Modified independence Comments: Significant use of BUE support with decrease step length bilaterally.    TREATMENT DATE:   02/01/24: SELF CARE HOME MANAGEMENT  -Use of TENS unit with box technique around L1-L5 central spinous process.  -Setting at 5    -Education how to obtain one and whether insurance would cover it and various models available at [facility] and [facility]   -Education about spinal nerve stimulation using video: [email or web address]  THERAC    Overground ambulation with TENS using 2WW 300 ft    -NRPS 5/10      PATIENT EDUCATION:  Education details: Form and technique for correct performance of exercise and explanation about cut-offs for functional tests.  Person educated: Patient Education method: Explanation, Demonstration, Verbal cues, and Handouts Education comprehension: verbalized understanding, returned demonstration, and verbal cues required  HOME EXERCISE PROGRAM: Access Code: [email or web address] URL: [email or web address] Date: 01/25/2024 Prepared by: Toribio Servant  Exercises - Alternating Step Taps with Counter Support (Mirrored)  - 5-7 x weekly - 3 sets - 20 reps - Standing Hip Abduction with Counter Support  - 2-3 x daily - 5-7 x weekly - 3 sets - 10 reps - Standing Hip Extension  with Counter Support  - 2-3 x daily - 5-7 x weekly - 3 sets - 10 reps - Seated Long Arc Quad  - 3-4 x weekly - 3 sets - 10 reps - Standing Ankle Dorsiflexion with Table Support  - 5-7 x weekly - 3 sets - 20 reps - Seated Slump Neural Tensioner  - 1 x daily - 7 x weekly - 1 sets - 10 reps  ASSESSMENT:  CLINICAL IMPRESSION:   Pt shows significant improvement in low back pain with use of TENS especially with movement evoked pain. PT educated pt on how to use TENS and recommended that she request order from her provider, because she would likely benefit from it to better perform physical activity. She will continue to benefit from skilled PT to improve motor control and strength of lower extremities to improve dynamic balance and gait stability to avoid falling and to decrease level of assistive device to return to her prior level of function.  OBJECTIVE IMPAIRMENTS: Abnormal gait, decreased endurance, decreased ROM, decreased strength, impaired flexibility, impaired sensation, obesity, and pain.   ACTIVITY LIMITATIONS: carrying, lifting, bending, standing, squatting, stairs, dressing, hygiene/grooming, and locomotion level  PARTICIPATION LIMITATIONS: meal prep, cleaning, laundry, shopping, community activity, and yard work  PERSONAL FACTORS: Fitness, Past/current experiences, Time since onset of injury/illness/exacerbation, and 3+ comorbidities: h/o stroke with right sided deficits, L3-L5 decompression, HLD, HTN are also affecting patient's functional outcome.   REHAB POTENTIAL: Fair multiple co-morbidities and chronicity of condition  CLINICAL DECISION MAKING: Evolving/moderate complexity  EVALUATION COMPLEXITY: Moderate   GOALS: Goals reviewed with patient? No  SHORT TERM GOALS: Target date: 01/15/2024  Patient will demonstrate undestanding of home exercise plan by performing exercises correctly with evidence of good carry over with min to no verbal or tactile cues .   Baseline: NT   01/16/24: Performing Independently  Goal status: ACHIEVED     2.   Pt will increase by at least 54m (199ft) in order to demonstrate clinically significant improvement in cardiopulmonary endurance and community ambulation Baseline: 8/28: 553' with RW and unable to complete 6 minutes (stopped at 5 minutes)   Goal status: NOT MET     3.  Patient will show decreased risk of falling as evidence by decrease in self-selected gait speed by >= 0.12 m/sec.  Baseline: 8/28: 0.61 m/s Goal status: NOT MET    4.  Pt will decrease 5TSTS by at least 3 seconds to demonstrate a minimal clinically significant improvement in LE strength.  Baseline: 14 seconds  Goal status: NOT MET     LONG TERM GOALS: Target date: 03/25/2024  Patient will improve activities specific balance scale score >=67% as evidence of an improvement self-perception of function and to decrease risk for falls Mae et al., 2022).  Baseline: 57% Goal status: ONGOING    2.  Patient will perform 5 x STS in <11.4 sec to demonstrate improve LE strength to decrease risk of falling (Bohannon, 2006). Baseline: 14 sec    Goal status: ONGOING     3.  Patient will complete >=12 reps on 30 sec chair stands to exhibit improved LE endurance to decrease risk of falling.  Baseline: 11 reps    Goal status: ONGOING  4.  Patient will ambulate >=1000 ft in to show community ambulator status to be able to have aerobic endurance to return to fulfilling walking tasks at his job.  Baseline: 8/28: 553' with RW and unable to complete 6 minutes (stopped at 5 minutes) Goal status: ONGOING    5.  Patient will demonstrate reduced falls risk as evidenced by Dynamic Gait Index (DGI) >19/24 to decrease risk of falling.  Baseline: 13/24 with use of single point cane in RUE    Goal status: ONGOING     6.  Patient will perform TUG in <13.5 sec as evidence of improved mobility and decreased risk for falling [facility] et al., 2000) Baseline:   8/28: 15.38 seconds with RW  Goal status: ONGOING    7. Patient will be able to ambulate safely with the least restrictive assistive device outside as  evidenced by decreased lateral sway, consistent stride length and step length, and with knowledge of when to take a break due to fatigue.  Baseline: Using 2WW or 4WW Goal status: ONGOING     8. Patient will perform in >=1.0 m./sec as evidence of improved mobility, decreased falls risk, and overall functional independence.  Baseline: 8/28: 0.61 m/s  Goal status: ONGOING   PLAN:  PT FREQUENCY: 1-2x/week  PT DURATION: 12 weeks  PLANNED INTERVENTIONS: 97164- PT Re-evaluation, 97750- Physical Performance Testing, 97110-Therapeutic exercises, 97530- Therapeutic activity, [id number]- Neuromuscular re-education, 97535- Self Care, [id number]- Manual therapy, [phone]- Gait training, [phone]- Orthotic Initial, [phone]- Orthotic/Prosthetic subsequent, [phone]- Canalith repositioning, [phone]- Aquatic Therapy, [phone]- Electrical stimulation (unattended), [phone]- Electrical stimulation (manual), [id number]- Ultrasound, 79439 (1-2 muscles), [phone] (3+ muscles)- Dry Needling, Patient/Family education, Balance training, Stair training, Taping, Joint mobilization, Joint manipulation, Spinal manipulation, Spinal mobilization, Scar mobilization, Vestibular training, DME instructions, Cryotherapy, Moist heat, and Biofeedback.  PLAN FOR NEXT SESSION: Reassess goals.  Assess muscle length: straight leg raise test. Add seated HS stretch and progress LE strengthening and motor control exercises in standing positions and spend more time with gait analysis. Circuit training using TENS  Toribio Servant PT, DPT  [facility] [address] [place], [address] Phone: [phone]   Fax:  [phone]

## 2024-02-04 NOTE — Progress Notes (Signed)
 Subjective:    Patient ID: Susan [name], female    DOB: 04-13-1961, 63 y.o.   MRN: [id number] Chief Complaint  Patient presents with   New Patient (Initial Visit)    np. ABI + Consult. Painful and cold lower extremity.  Bair, Luke     The patient is a 63 year old female that presents today as a referral from her primary care provider in regards to concern for possible peripheral arterial disease.  It was noted that at physical exam her feet were colder than her extremities.  She has pain in her lower extremity but does have a history of neuropathy.  She also has a history of lower back issues as well.  She denies classic claudication-like symptoms.  There is also no rest pain like description or ulceration.  Currently the patient underwent ABIs which show a right ABI 1.16 and a left of 1.19.  She has good triphasic tibial artery waveforms bilaterally with good toe waveforms bilaterally.    Review of Systems  Musculoskeletal:  Positive for gait problem.  Neurological:  Positive for numbness.  All other systems reviewed and are negative.      Objective:   Physical Exam Vitals reviewed.  HENT:     Head: Normocephalic.  Cardiovascular:     Rate and Rhythm: Normal rate.     Pulses: Normal pulses.  Pulmonary:     Effort: Pulmonary effort is normal.  Skin:    General: Skin is warm and dry.  Neurological:     Mental Status: She is alert and oriented to person, place, and time.     Motor: Weakness present.     Gait: Gait abnormal.  Psychiatric:        Mood and Affect: Mood normal.        Behavior: Behavior normal.        Thought Content: Thought content normal.        Judgment: Judgment normal.     BP 121/77   Pulse 66   Ht 5' 2 (1.575 m)   Wt 224 lb (101.6 kg)   BMI 40.97 kg/m   Past Medical History:  Diagnosis Date   ADD (attention deficit disorder)    Alcohol abuse    Anemia    Anxiety    Aortic atherosclerosis 02/20/2018   Chest CT Sept 2019    Arthritis    rheumatoid arthritis   Asthma    Bipolar disorder (HCC)    Centrilobular emphysema (HCC)    Chronic diarrhea 07/22/2022   Cocaine use disorder, moderate, in sustained remission (HCC)    Constipation    Degenerative disc disease at L5-S1 level 09/28/2016   See ortho note May 2018   Depression    bipolar, hx of suicide attempt   Diabetes mellitus, type 2 (HCC)    Diverticulitis 2013   DOE (dyspnea on exertion)    Drug use    Family history of adverse reaction to anesthesia    mom-delayed emergence   GERD (gastroesophageal reflux disease)    H/O suicide attempt    slit wrists   Hepatitis C 06/26/2014   treated with Harvoni   Hip pain    History of echocardiogram    a. 04/2019 Echo: EF >65%, nl RV fxn.   History of MRSA infection 2013   HLD (hyperlipidemia)    Hypertension    a. 06/2021 Renal Duplex: ? bilat RAS; b. 06/2021 CTA Abd: No signif RAS.   Hyponatremia    Hypothyroidism  Incisional hernia 11/08/2012   Knee pain    Left carotid bruit    Morbid obesity (HCC)    Multinodular thyroid     OSA (obstructive sleep apnea)    OSA on CPAP    Osteoporosis    Palpitations    Post-traumatic osteoarthritis of right knee 09/24/2015   Recurrent ventral hernia 11/08/2012   Skin lesion 06/28/2022   Status post total hip replacement, left 07/23/2019   Status post total right knee replacement using cement 05/10/2016   Stroke (HCC) 07/05/2022   right arm,torso and right leg numbness   Tobacco use disorder    Tobacco use disorder, continuous 06/26/2014   Vitamin B12 deficiency    Vitamin D  deficiency 04/09/2015    Social History   Socioeconomic History   Marital status: Widowed    Spouse name: Not on file   Number of children: 3   Years of education: Not on file   Highest education level: Some college, no degree  Occupational History   Occupation: disabled  Tobacco Use   Smoking status: Some Days    Current packs/day: 0.50    Average packs/day: 0.5  packs/day for 48.7 years (24.4 ttl pk-yrs)    Types: Cigarettes    Start date: 75   Smokeless tobacco: Never  Vaping Use   Vaping status: Every Day   Substances: Nicotine   Substance and Sexual Activity   Alcohol use: No    Alcohol/week: 0.0 standard drinks of 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   Drug use: No    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   Sexual activity: Not Currently  Other Topics Concern   Not on file  Social History Narrative   Not on file   Social Drivers of Health   Financial Resource Strain: Low Risk  (03/29/2023)   Overall Financial Resource Strain (CARDIA)    Difficulty of Paying Living Expenses: Not hard at all  Food Insecurity: No Food Insecurity (03/29/2023)   Hunger Vital Sign    Worried About Running Out of Food in the Last Year: Never true    Ran Out of Food in the Last Year: Never true  Transportation Needs: No Transportation Needs (03/29/2023)   PRAPARE - Administrator, Civil Service (Medical): No    Lack of Transportation (Non-Medical): No  Physical Activity: Inactive (03/29/2023)   Exercise Vital Sign    Days of Exercise per Week: 0 days    Minutes of Exercise per Session: 0 min  Stress: No Stress Concern Present (03/29/2023)   [employer] of Occupational Health - Occupational Stress Questionnaire    Feeling of Stress : Only a little  Social Connections: Moderately Isolated (03/29/2023)   Social Connection and Isolation Panel    Frequency of Communication with Friends and Family: Twice a week    Frequency of Social Gatherings with Friends and Family: More than three times a week    Attends Religious Services: Never    Database administrator or Organizations: Yes    Attends Engineer, structural: More than 4 times per year    Marital Status: Widowed  Intimate Partner Violence: Unknown (09/19/2023)   Received from [facility]   HITS    Over the last 12 months how often did your partner  physically hurt you?: Never    Over the last 12 months how often did your partner insult you or talk down to you?: Never    Threaten Physical Harm:  Not on file    Scream or Curse: Not on file    Past Surgical History:  Procedure Laterality Date   BILATERAL SALPINGOOPHORECTOMY     due to abnormal mass   BREAST BIOPSY Left    neg   BREAST SURGERY Left 20 yrs ago   CESAREAN SECTION     COLONOSCOPY     COLONOSCOPY WITH PROPOFOL  N/A 10/16/2017   Procedure: COLONOSCOPY WITH PROPOFOL ;  Surgeon: Therisa Bi, MD;  Location: [facility];  Service: Gastroenterology;  Laterality: N/A;   COLONOSCOPY WITH PROPOFOL  N/A 02/16/2021   Procedure: COLONOSCOPY WITH PROPOFOL ;  Surgeon: Therisa Bi, MD;  Location: [facility] ENDOSCOPY;  Service: Gastroenterology;  Laterality: N/A;   FORAMINOTOMY 1 LEVEL Right 10/10/2022   Procedure: RIGHT L4-5 LAMINOFORAMINOTOMY;  Surgeon: Clois Fret, MD;  Location: [facility] ORS;  Service: Neurosurgery;  Laterality: Right;   HERNIA REPAIR  06/2011, July 2014   Ventral wall repair with Physiomesh   HERNIA REPAIR     2nd.vental wall repair   JOINT REPLACEMENT Right    knee   LUMBAR LAMINECTOMY/ DECOMPRESSION WITH MET-RX N/A 10/12/2022   Procedure: LUMBAR LAMINECTOMY/ DECOMPRESSION WITH MET-RX;  Surgeon: Clois Fret, MD;  Location: [facility] ORS;  Service: Neurosurgery;  Laterality: N/A;   TONSILLECTOMY     TOTAL HIP ARTHROPLASTY Left 07/23/2019   Procedure: TOTAL HIP ARTHROPLASTY;  Surgeon: Edie Norleen PARAS, MD;  Location: [facility] ORS;  Service: Orthopedics;  Laterality: Left;   TOTAL KNEE ARTHROPLASTY Right 05/10/2016   Procedure: TOTAL KNEE ARTHROPLASTY;  Surgeon: Norleen PARAS Edie, MD;  Location: [facility] ORS;  Service: Orthopedics;  Laterality: Right;   TUBAL LIGATION      Family History  Problem Relation Age of Onset   Arthritis Mother    Asthma Mother    Mental illness Mother    Thyroid  disease Mother    COPD Mother    Heart disease Mother    Congestive Heart Failure Mother     Alcohol abuse Mother    Eating disorder Mother    Bipolar disorder Mother    Alcohol abuse Father    Heart attack Father    Alcohol abuse Sister    Drug abuse Sister    Mental illness Sister    Mental illness Sister    Fibromyalgia Sister    Obesity Sister    Pneumonia Sister    Depression Sister    Mental illness Sister    Alcohol abuse Sister    Drug abuse Sister    Arthritis Brother    Mental illness Brother    Cancer Brother        non-hodkins lymphoma   Drug abuse Daughter    Drug abuse Son    Alcohol abuse Son    Drug abuse Son    Alcohol abuse Son    Diabetes Neg Hx    Stroke Neg Hx    Breast cancer Neg Hx    Thyroid  cancer Neg Hx     Allergies  Allergen Reactions   Wellbutrin  [Bupropion ]     Patient reports made  deathly sick  years ago at appointment 03/18/20.   Furosemide Other (See Comments)    Electrolyte imbalance  Other Reaction(s): Other (See Comments)  Electrolyte imbalance Electrolyte imbalance Electrolyte imbalance    Electrolyte imbalance   Hydrochlorothiazide  Other (See Comments)    Electrolyte imbalance  Other Reaction(s): Dizziness, Other (See Comments)       Latest Ref Rng & Units 12/22/2023    8:51 AM 12/04/2023  1:18 PM 11/17/2023    7:40 AM  CBC  WBC 4.0 - 10.5 K/uL 7.3  8.1  7.5   Hemoglobin 12.0 - 15.0 g/dL 88.6  89.7  89.0   Hematocrit 36.0 - 46.0 % 33.7  31.5  33.1   Platelets 150.0 - 400.0 K/uL 219.0  202  222       CMP     Component Value Date/Time   NA 137 12/04/2023 1318   NA 137 05/28/2020 0903   NA 136 04/29/2014 1133   K 4.3 12/04/2023 1318   K 4.2 04/29/2014 1133   CL 104 12/04/2023 1318   CL 103 04/29/2014 1133   CO2 20 12/04/2023 1318   CO2 30 04/29/2014 1133   GLUCOSE 81 12/04/2023 1318   GLUCOSE 85 04/29/2014 1133   BUN 10 12/04/2023 1318   BUN 9 05/28/2020 0903   BUN 9 04/29/2014 1133   CREATININE 1.35 (H) 12/04/2023 1318   CALCIUM  8.8 12/04/2023 1318   CALCIUM  9.1 04/29/2014 1133   PROT  6.3 12/04/2023 1318   PROT 6.2 05/28/2020 0903   PROT 7.1 03/13/2013 1509   ALBUMIN 3.7 11/17/2023 0740   ALBUMIN 4.2 05/28/2020 0903   ALBUMIN 3.5 03/13/2013 1509   AST 14 12/04/2023 1318   AST 90 (H) 03/13/2013 1509   ALT 10 12/04/2023 1318   ALT 96 (H) 03/13/2013 1509   ALKPHOS 82 11/17/2023 0740   ALKPHOS 110 03/13/2013 1509   BILITOT 0.4 12/04/2023 1318   BILITOT <0.2 05/28/2020 0903   BILITOT 0.6 03/13/2013 1509   GFR 37.81 (L) 03/28/2023 0942   EGFR 44 (L) 12/04/2023 1318   GFRNONAA 22 (L) 11/17/2023 1026   GFRNONAA 69 10/16/2019 1139     VAS US  ABI WITH/WO TBI Result Date: 01/29/2024  LOWER EXTREMITY DOPPLER STUDY Patient Name:  JACIA SICKMAN  Date of Exam:   01/24/2024 Medical Rec #: [id number]         Accession #:    [id number] Date of Birth: 1961/03/06         Patient Gender: F Patient Age:   18 years Exam Location:  [facility] Procedure:      VAS US  ABI WITH/WO TBI Referring Phys: --------------------------------------------------------------------------------  High Risk Factors: Hypertension, hyperlipidemia, Diabetes, current smoker, prior                    CVA. Other Factors: Worsening right leg numbness status post CVA.  Performing Technologist: Donnice Charnley RVT  Examination Guidelines: A complete evaluation includes at minimum, Doppler waveform signals and systolic blood pressure reading at the level of bilateral brachial, anterior tibial, and posterior tibial arteries, when vessel segments are accessible. Bilateral testing is considered an integral part of a complete examination. Photoelectric Plethysmograph (PPG) waveforms and toe systolic pressure readings are included as required and additional duplex testing as needed. Limited examinations for reoccurring indications may be performed as noted.  ABI Findings: +---------+------------------+-----+---------+--------+ Right    Rt Pressure (mmHg)IndexWaveform Comment   +---------+------------------+-----+---------+--------+ Brachial 146                                      +---------+------------------+-----+---------+--------+ PTA      170               1.16 triphasic         +---------+------------------+-----+---------+--------+ DP       165  1.13 triphasic         +---------+------------------+-----+---------+--------+ Great Toe162               1.11                   +---------+------------------+-----+---------+--------+ +---------+------------------+-----+---------+-------+ Left     Lt Pressure (mmHg)IndexWaveform Comment +---------+------------------+-----+---------+-------+ Brachial 142                                     +---------+------------------+-----+---------+-------+ PTA      174               1.19 triphasic        +---------+------------------+-----+---------+-------+ DP       156               1.07 triphasic        +---------+------------------+-----+---------+-------+ Great Toe169               1.16                  +---------+------------------+-----+---------+-------+ +-------+-----------+-----------+------------+------------+ ABI/TBIToday's ABIToday's TBIPrevious ABIPrevious TBI +-------+-----------+-----------+------------+------------+ Right  1.16       1.11                                +-------+-----------+-----------+------------+------------+ Left   1.19       1.16                                +-------+-----------+-----------+------------+------------+   Summary: Right: Resting right ankle-brachial index is within normal range. The right toe-brachial index is normal.  Left: Resting left ankle-brachial index is within normal range. The left toe-brachial index is normal.  *See table(s) above for measurements and observations.  Electronically signed by Cordella Shawl MD on 01/29/2024 at 10:39:31 AM.    Final        Assessment & Plan:   1. Bilateral cold feet  (Primary) Upon the patient's symptoms and noninvasive studies I do not believe the cause of the bilateral coolness and discomfort is related to peripheral arterial disease.  I suspect is more so related to her neuropathic symptoms.  At this time the patient has normal ABIs with strong triphasic waveforms bilaterally.  No indication for further testing at this time.  No intervention is recommended.  2. Type II diabetes mellitus with nephropathy (HCC) Continue hypoglycemic medications as already ordered, these medications have been reviewed and there are no changes at this time.  Hgb A1C to be monitored as already arranged by primary service   Current Outpatient Medications on File Prior to Visit  Medication Sig Dispense Refill   acetaminophen  (TYLENOL ) 500 MG tablet Take 1,000 mg by mouth 2 (two) times daily.     aspirin  EC 81 MG tablet Take 1 tablet (81 mg total) by mouth daily. *Swallow whole* 30 tablet 6   bisacodyl  (ONELAX) 10 MG suppository Unwrap and insert 1 suppository (10 mg total) rectally as needed for moderate constipation. *If no bowel movement with morning colace.* 12 suppository 0   carvedilol  (COREG ) 12.5 MG tablet Take 1 tablet (12.5 mg total) by mouth 2 (two) times daily. 180 tablet 3   cyanocobalamin  (VITAMIN B12) 100 MCG tablet Take 1 tablet (100 mcg total) by mouth daily. 90 tablet 3   cyclobenzaprine  (FLEXERIL ) 5 MG  tablet Take 0.5-1 tablets (2.5-5 mg total) by mouth 2 (two) times daily as needed. 60 tablet 6   DULoxetine  (CYMBALTA ) 20 MG capsule Take 1 capsule (20 mg total) by mouth daily. *Take with 30 mg capsule* 30 capsule 7   DULoxetine  (CYMBALTA ) 30 MG capsule Take 1 capsule (30 mg total) by mouth daily. *Take with 20 mg capsule.* 30 capsule 9   gabapentin  (NEURONTIN ) 300 MG capsule Take 2 capsules (600 mg total) by mouth 2 (two) times daily. 360 capsule 1   HYDROcodone -acetaminophen  (NORCO/VICODIN) 5-325 MG tablet Take 1 tablet by mouth daily as needed for moderate  pain.     lurasidone  (LATUDA ) 20 MG TABS tablet Take 1 tablet (20 mg total) by mouth daily with supper. 30 tablet 2   naloxone (NARCAN) nasal spray 4 mg/0.1 mL Place into the nose.     rosuvastatin  (CRESTOR ) 20 MG tablet Take 1 tablet (20 mg total) by mouth daily. 90 tablet 3   Semaglutide , 2 MG/DOSE, (OZEMPIC , 2 MG/DOSE,) 8 MG/3ML SOPN Inject 2 mg into the skin once a week. 3 mL 3   Semaglutide , 2 MG/DOSE, (OZEMPIC , 2 MG/DOSE,) 8 MG/3ML SOPN Inject 2mg  under the skin once a week 3 mL 9   Semaglutide , 2 MG/DOSE, (OZEMPIC , 2 MG/DOSE,) 8 MG/3ML SOPN Inject 2 mg into the skin once a week. 3 mL 6   telmisartan  (MICARDIS ) 80 MG tablet Take 1 tablet (80 mg total) by mouth daily. 30 tablet 5   traZODone  (DESYREL ) 50 MG tablet Take 1 tablet by mouth at bedtime as needed for sleep. for sleep 30 tablet 2   Vibegron  (GEMTESA ) 75 MG TABS Take 1 tablet (75 mg total) by mouth daily. 90 tablet 3   vitamin B-12 (CYANOCOBALAMIN ) 100 MCG tablet Take 1 tablet (100 mcg total) by mouth daily. 90 tablet 3   clotrimazole  (LOTRIMIN ) 1 % cream Apply 1 application  topically 2 (two) times daily as needed.     No current facility-administered medications on file prior to visit.    There are no Patient Instructions on file for this visit. No follow-ups on file.   Deshawn Skelley E Massiah Longanecker, NP

## 2024-02-05 ENCOUNTER — Ambulatory Visit: Admitting: Physical Therapy

## 2024-02-05 DIAGNOSIS — R262 Difficulty in walking, not elsewhere classified: Secondary | ICD-10-CM

## 2024-02-05 DIAGNOSIS — R2689 Other abnormalities of gait and mobility: Secondary | ICD-10-CM

## 2024-02-05 DIAGNOSIS — M5459 Other low back pain: Secondary | ICD-10-CM | POA: Diagnosis not present

## 2024-02-05 DIAGNOSIS — M6281 Muscle weakness (generalized): Secondary | ICD-10-CM | POA: Diagnosis not present

## 2024-02-05 NOTE — Therapy (Signed)
 OUTPATIENT PHYSICAL THERAPY THORACOLUMBAR AND BALANCE  TREATMENT   Patient Name: Susan [name] MRN: [id number] DOB:10/09/1960, 63 y.o., female Today's Date: 02/05/2024  END OF SESSION:  PT End of Session - 02/05/24 1400     Visit Number 6    Number of Visits 24    Date for Recertification  03/25/24    Authorization Type [facility] Medicare Dual 2025    Authorization - Visit Number 6    Authorization - Number of Visits 24    Progress Note Due on Visit 10    PT Start Time 0945    PT Stop Time 1030    PT Time Calculation (min) 45 min    Activity Tolerance Patient tolerated treatment well    Behavior During Therapy [facility] for tasks assessed/performed             Past Medical History:  Diagnosis Date   ADD (attention deficit disorder)    Alcohol abuse    Anemia    Anxiety    Aortic atherosclerosis 02/20/2018   Chest CT Sept 2019   Arthritis    rheumatoid arthritis   Asthma    Bipolar disorder (HCC)    Centrilobular emphysema (HCC)    Chronic diarrhea 07/22/2022   Cocaine use disorder, moderate, in sustained remission (HCC)    Constipation    Degenerative disc disease at L5-S1 level 09/28/2016   See ortho note May 2018   Depression    bipolar, hx of suicide attempt   Diabetes mellitus, type 2 (HCC)    Diverticulitis 2013   DOE (dyspnea on exertion)    Drug use    Family history of adverse reaction to anesthesia    mom-delayed emergence   GERD (gastroesophageal reflux disease)    H/O suicide attempt    slit wrists   Hepatitis C 06/26/2014   treated with Harvoni   Hip pain    History of echocardiogram    a. 04/2019 Echo: EF >65%, nl RV fxn.   History of MRSA infection 2013   HLD (hyperlipidemia)    Hypertension    a. 06/2021 Renal Duplex: ? bilat RAS; b. 06/2021 CTA Abd: No signif RAS.   Hyponatremia    Hypothyroidism    Incisional hernia 11/08/2012   Knee pain    Left carotid bruit    Morbid obesity (HCC)    Multinodular thyroid     OSA (obstructive sleep  apnea)    OSA on CPAP    Osteoporosis    Palpitations    Post-traumatic osteoarthritis of right knee 09/24/2015   Recurrent ventral hernia 11/08/2012   Skin lesion 06/28/2022   Status post total hip replacement, left 07/23/2019   Status post total right knee replacement using cement 05/10/2016   Stroke (HCC) 07/05/2022   right arm,torso and right leg numbness   Tobacco use disorder    Tobacco use disorder, continuous 06/26/2014   Vitamin B12 deficiency    Vitamin D  deficiency 04/09/2015   Past Surgical History:  Procedure Laterality Date   BILATERAL SALPINGOOPHORECTOMY     due to abnormal mass   BREAST BIOPSY Left    neg   BREAST SURGERY Left 20 yrs ago   CESAREAN SECTION     COLONOSCOPY     COLONOSCOPY WITH PROPOFOL  N/A 10/16/2017   Procedure: COLONOSCOPY WITH PROPOFOL ;  Surgeon: Therisa Bi, MD;  Location: [facility] ENDOSCOPY;  Service: Gastroenterology;  Laterality: N/A;   COLONOSCOPY WITH PROPOFOL  N/A 02/16/2021   Procedure: COLONOSCOPY WITH PROPOFOL ;  Surgeon: Therisa,  Ruel, MD;  Location: [facility] ENDOSCOPY;  Service: Gastroenterology;  Laterality: N/A;   FORAMINOTOMY 1 LEVEL Right 10/10/2022   Procedure: RIGHT L4-5 LAMINOFORAMINOTOMY;  Surgeon: Clois Fret, MD;  Location: [facility] ORS;  Service: Neurosurgery;  Laterality: Right;   HERNIA REPAIR  06/2011, July 2014   Ventral wall repair with Physiomesh   HERNIA REPAIR     2nd.vental wall repair   JOINT REPLACEMENT Right    knee   LUMBAR LAMINECTOMY/ DECOMPRESSION WITH MET-RX N/A 10/12/2022   Procedure: LUMBAR LAMINECTOMY/ DECOMPRESSION WITH MET-RX;  Surgeon: Clois Fret, MD;  Location: [facility] ORS;  Service: Neurosurgery;  Laterality: N/A;   TONSILLECTOMY     TOTAL HIP ARTHROPLASTY Left 07/23/2019   Procedure: TOTAL HIP ARTHROPLASTY;  Surgeon: Edie Norleen PARAS, MD;  Location: [facility] ORS;  Service: Orthopedics;  Laterality: Left;   TOTAL KNEE ARTHROPLASTY Right 05/10/2016   Procedure: TOTAL KNEE ARTHROPLASTY;  Surgeon: Norleen PARAS Edie,  MD;  Location: [facility] ORS;  Service: Orthopedics;  Laterality: Right;   TUBAL LIGATION     Patient Active Problem List   Diagnosis Date Noted   Urge incontinence 12/22/2023   Painful and cold lower extremity 12/22/2023   Hemiplegia and hemiparesis following cerebral infarction affecting right dominant side (HCC) 12/04/2023   CKD (chronic kidney disease) stage 3, GFR 30-59 ml/min (HCC) 12/04/2023   Dysuria 11/03/2023   History of stroke 04/17/2023   Right leg weakness 04/17/2023   Neurogenic bladder 02/13/2023   Abnormality of gait and mobility 02/13/2023   Morbid obesity with body mass index (BMI) of 40.0 to 44.9 in adult (HCC) 01/10/2023   Left thyroid  nodule 01/10/2023   Bipolar 1 disorder, depressed, moderate (HCC) 11/29/2022   Normocytic normochromic anemia 10/13/2022   Epidural hematoma (HCC) 10/12/2022   Mass in epidural space 10/12/2022   Synovial cyst of lumbar facet joint 10/10/2022   Lumbar radiculopathy 10/10/2022   S/P laminectomy 10/10/2022   Epigastric pain 07/22/2022   Enlarged thyroid  07/22/2022   CVA (cerebrovascular accident) (HCC) 07/05/2022   Type II diabetes mellitus with nephropathy (HCC) 06/16/2021   H/O thyroid  nodule 05/31/2021   History of hepatitis C- treated 2014- 2015 with [facility] gastroenterology and followed.  05/19/2021   Left carotid bruit 04/28/2021   Bipolar disorder, in full remission, most recent episode depressed 02/05/2021   Anxiety 05/28/2020   Bipolar affective disorder in remission 05/28/2020   Bipolar disorder, in full remission, most recent episode mixed 03/06/2020   Need for immunization against influenza 01/27/2020   Morbid obesity (HCC) 01/27/2020   Osteoporosis 11/06/2019   Bipolar 1 disorder, mixed, mild (HCC) 10/29/2019   PTSD (post-traumatic stress disorder) 09/27/2019   Bereavement 09/27/2019   Cocaine use disorder, moderate, in sustained remission (HCC) 09/27/2019   Primary osteoarthritis of left hip 06/14/2019   Allergic  rhinitis 08/23/2018   Aortic atherosclerosis 02/20/2018   Centrilobular emphysema (HCC) 02/20/2018   Hyperlipidemia 03/28/2017   Degenerative disc disease at L5-S1 level 09/28/2016   GERD (gastroesophageal reflux disease) 12/17/2015   Chronic back pain 10/09/2015   Post-traumatic osteoarthritis of right knee 09/24/2015   Alcohol use disorder, severe, in sustained remission (HCC) 04/13/2015   OSA on CPAP 04/09/2015   Subclinical hyperthyroidism 06/26/2014   Goiter colloid, toxic, nodular 06/26/2014   Hypertension 06/26/2014   Bipolar I disorder (HCC) 06/26/2014   Lumbar radiculitis 10/03/2013   Diverticulosis 08/24/2013    PCP: Dr. Luke Shade     REFERRING PROVIDER: Dr. Fret Clois    REFERRING DIAG: M54.50,G89.29 (ICD-10-CM) - Chronic  bilateral low back pain without sciatica  Rationale for Evaluation and Treatment: Rehabilitation  THERAPY DIAG:  No diagnosis found.  ONSET DATE: 10/2022  SUBJECTIVE:                                                                                                                                                                                           SUBJECTIVE STATEMENT:   Pt reports continuing to feel improvement in her right sided low back pain and she attributes this to the use of TENS unit. Her sister gave her a unit and she tried charging it but it would not work.   PERTINENT HISTORY:  She has prior history of left sided stroke affecting her right side in February 2024. She also had a L3-L4 lumbar decompression surgery in June 2024 with complications due to a epidural hematoma that resulted in cauda equinda syndrome and her needing to return to ER for evacuation and she was in the hospital for an additional 8 days following surgery.  She is just moving back to [place] from [place], because she has qualified for housing in [place]. She describes being most concerned with her balance especially when walking over uneven surfaces.  She mainly uses a a 2WW when walking to places that she has to drive to because it is easier to transport. She has not had a fall in the past 6 mo, but she is scared of falling especially when walking over uneven surfaces. She does report an improvement in LE function with her now being able to raise her toes and that she was not able to do this after surgery.   PAIN:  Are you having pain? Yes: NPRS scale: 8/10 NRPS on central spinous process of L1-L5    Pain location: L1-L5 Central Spinous   Pain description: Dull   Aggravating factors: Tends to hurt more as the day goes on or the more active she is. Relieving factors: 5 x 325 Viacodin, Alieve  as needed     PRECAUTIONS: Fall; she is afraid of falling    RED FLAGS: None   WEIGHT BEARING RESTRICTIONS: No  FALLS:  Has patient fallen in last 6 months? No  LIVING ENVIRONMENT: Lives with: lives alone Lives in: House/apartment Stairs: Yes: External: 4 steps; on right going up and there is a ramp going to a house and there is a ramped main entrance leading to lobby. Place is called [employer]   Has following equipment at home: Single point cane, Environmental consultant - 2 wheeled, Environmental consultant - 4 wheeled, shower chair, Grab bars, and Ramped entry  OCCUPATION: Retired   PLOF: Independent  PATIENT GOALS:  Pt wants to be able to walk outside using single point cane and to feel more confidence when walking especially over uneven surfaces. She currently uses rollator to walk dog at home and 2WW when walking anywhere where she has to drive.   NEXT MD VISIT: October 2025     OBJECTIVE:  Note: Objective measures were completed at Evaluation unless otherwise noted.  VITALS BP 118/66 HR  60 Sp02  97%  DIAGNOSTIC FINDINGS:  CLINICAL DATA:  Low back pain. Cauda equina syndrome suspected. Cyst removal from the spine two days ago. Postoperative urinary incontinence and bilateral leg numbness.   EXAM: MRI LUMBAR SPINE WITHOUT CONTRAST    TECHNIQUE: Multiplanar, multisequence MR imaging of the lumbar spine was performed. No intravenous contrast was administered.   COMPARISON:  05/18/2022   FINDINGS: Segmentation: 5 lumbar type vertebral bodies as numbered previously.   Alignment: Scoliotic curvature convex to the right as seen previously. No significant listhesis.   Vertebrae: Edematous change of the endplates at T10, U88 and T12, likely to relate to regional pain. This is probably degenerative. Lesser vertebral endplate edema noted at L3-4 and L4-5.   Conus medullaris and cauda equina: Conus extends to the L1 level. Conus and cauda equina appear normal.   Paraspinal and other soft tissues: Posterior surgical approach at the L4-5 level from the right-side has an expected appearance.   Disc levels:   At the surgical level and extending within the spinal canal from the upper L3 level to the L 4 5 disc level, there is a fluid collection which causes marked compression of the thecal sac. This is most severe at the level of the L3-4 disc where the fluid collection within the canal measures 1.8 x 1.5 cm in diameter and markedly flattens the thecal sac anteriorly.   Other than the surgical decompression of the synovial cyst on the right and the postoperative intraspinal fluid collection, there is no change since the preoperative exam, as one might expect.   IMPRESSION: 1. At the surgical level and extending from the upper L3 level to the L4-5 disc level, there is a postoperative fluid collection within the spinal canal which causes marked compression of the thecal sac anteriorly. This is most severe at the level of the L3-4 disc where the fluid collection within the canal measures 1.8 x 1.5 cm in diameter and markedly flattens the thecal sac anteriorly.     Electronically Signed   By: Oneil Officer M.D.   On: 10/12/2022 16:20  PATIENT SURVEYS:  ABC scale: The Activities-Specific Balance Confidence (ABC)  Scale 0% 10 20 30  40 50 60 70 80 90 100% No confidence<->completely confident  "How confident are you that you will not lose your balance or become unsteady when you . . .   Date tested 01/01/24  Walk around the house 80%  2. Walk up or down stairs 80%  3. Bend over and pick up a slipper from in front of a closet floor 60%  4. Reach for a small can off a shelf at eye level 90%  5. Stand on tip toes and reach for something above your head 0%  6. Stand on a chair and reach for something 40%  7. Sweep the floor 50%  8. Walk outside the house to a car parked in the driveway 90%  9. Get into or out of a car 80%  10. Walk across a parking lot to the mall 80%  11. Walk up or down a ramp  80%  12. Walk in a crowded mall where people rapidly walk past you 60%  13. Are bumped into by people as you walk through the mall 40%  14. Step onto or off of an escalator while you are holding onto the railing 50%  15. Step onto or off an escalator while holding onto parcels such that you cannot hold onto the railing 30%  16. Walk outside on icy sidewalks 0%  Total: #/16  57% (910/1600)       COGNITION: Overall cognitive status: Within functional limits for tasks assessed     SENSATION: Light touch: Impaired electricity down her RLE   MUSCLE LENGTH: Hamstrings: Not tested   Debby test: Not tested    POSTURE: rounded shoulders  PALPATION: Not performed    LUMBAR ROM:   AROM eval  Flexion 80%  Extension 100%  Right lateral flexion 100%  Left lateral flexion 100%  Right rotation 100%  Left rotation 100%   (Blank rows = not tested)  LOWER EXTREMITY ROM:     Active  Right eval Left eval  Hip flexion    Hip extension    Hip abduction    Hip adduction    Hip internal rotation    Hip external rotation    Knee flexion    Knee extension    Ankle dorsiflexion    Ankle plantarflexion    Ankle inversion    Ankle eversion     (Blank rows = not tested)  LOWER EXTREMITY MMT:    MMT  Right eval Left eval  Hip flexion 4- 4  Hip extension    Hip abduction    Hip adduction    Hip internal rotation    Hip external rotation    Knee flexion 4 4  Knee extension 4 4  Ankle dorsiflexion 4 4  Ankle plantarflexion    Ankle inversion    Ankle eversion     (Blank rows = not tested)    FUNCTIONAL TESTS:  5 times sit to stand: 14 sec  30 seconds chair stand test: 11 reps   Timed up and go (TUG): 15.38 sec with use of single point cane   6 minute walk test: 553 ft  10 meter walk test: 0.61 m/sec                 DGI:  13/26                       <=19/24  predictive of falls in the elderly     GAIT: Distance walked: 30 ft    Assistive device utilized: Walker - 2 wheeled Level of assistance: Modified independence Comments: Significant use of BUE support with decrease step length bilaterally.    TREATMENT DATE:   02/05/24: TENS throughout all activity setting at 5 with box technique   THERAC  Nu-Step with seat and arms at 6 for 5 min  Mini-Squat with BUE support using 2WW 1 x 10   Marches with BUE support using 2WW 1 x 10    Straight Leg Raise R/L 80/60   Seated HS Stretch 4 x 60 sec    -min VC to maintain knee extension     PHYSICAL PERFORMANCE  5x STS: 14 sec    : use of 2WW  -558 ft with 2WW   -RPE  6/10  -Dyspnea Scale: 7-8/10      PATIENT EDUCATION:  Education details: Form and technique for correct performance of  exercise and explanation about cut-offs for functional tests.  Person educated: Patient Education method: Explanation, Demonstration, Verbal cues, and Handouts Education comprehension: verbalized understanding, returned demonstration, and verbal cues required  HOME EXERCISE PROGRAM: Access Code: [email or web address] URL: [email or web address] Date: 02/05/2024 Prepared by: Toribio Servant  Exercises - Seated Hamstring Stretch  - 1 x daily - 7 x weekly - 2-3 reps - 60 sec hold - Seated Hamstring Stretch (Mirrored)  - 1 x daily - 7  x weekly - 2-3 reps - 60 sec hold - Alternating Step Taps with Counter Support (Mirrored)  - 5-7 x weekly - 3 sets - 20 reps - Standing Hip Abduction with Counter Support  - 2-3 x daily - 5-7 x weekly - 3 sets - 10 reps - Standing Hip Extension with Counter Support  - 2-3 x daily - 5-7 x weekly - 3 sets - 10 reps - Sit to Stand  - 3-4 x weekly - 3 sets - 10 reps - Standing Ankle Dorsiflexion with Table Support  - 5-7 x weekly - 3 sets - 20 reps - Seated Slump Neural Tensioner  - 1 x daily - 7 x weekly - 1 sets - 10 reps  ASSESSMENT:  CLINICAL IMPRESSION:   Pt showing ongoing improvement with activity tolerance with use of TENS. She was able to complete the without being limited by pain. Her TENS could not maintain a charge, so PT recommends that she utilize her medical equipment order to. She did not show an improvement in LE strength or endurance, but this is likely due to pain limitations prior to the TENS limiting her form completing exercise. She will continue to benefit from skilled PT to improve motor control and strength of lower extremities to improve dynamic balance and gait stability to avoid falling and to decrease level of assistive device to return to her prior level of function.         OBJECTIVE IMPAIRMENTS: Abnormal gait, decreased endurance, decreased ROM, decreased strength, impaired flexibility, impaired sensation, obesity, and pain.   ACTIVITY LIMITATIONS: carrying, lifting, bending, standing, squatting, stairs, dressing, hygiene/grooming, and locomotion level  PARTICIPATION LIMITATIONS: meal prep, cleaning, laundry, shopping, community activity, and yard work  PERSONAL FACTORS: Fitness, Past/current experiences, Time since onset of injury/illness/exacerbation, and 3+ comorbidities: h/o stroke with right sided deficits, L3-L5 decompression, HLD, HTN are also affecting patient's functional outcome.   REHAB POTENTIAL: Fair multiple co-morbidities and chronicity of  condition  CLINICAL DECISION MAKING: Evolving/moderate complexity  EVALUATION COMPLEXITY: Moderate   GOALS: Goals reviewed with patient? No  SHORT TERM GOALS: Target date: 01/15/2024  Patient will demonstrate undestanding of home exercise plan by performing exercises correctly with evidence of good carry over with min to no verbal or tactile cues .   Baseline: NT  01/16/24: Performing Independently  Goal status: ACHIEVED     2.   Pt will increase by at least 23m (142ft) in order to demonstrate clinically significant improvement in cardiopulmonary endurance and community ambulation Baseline: 8/28: 553' with RW and unable to complete 6 minutes (stopped at 5 minutes)   Goal status: NOT MET     3.  Patient will show decreased risk of falling as evidence by decrease in self-selected gait speed by >= 0.12 m/sec.  Baseline: 8/28: 0.61 m/s Goal status: NOT MET    4.  Pt will decrease 5TSTS by at least 3 seconds to demonstrate a minimal clinically significant improvement in LE strength.  Baseline: 14 seconds  Goal status: NOT MET  LONG TERM GOALS: Target date: 03/25/2024  Patient will improve activities specific balance scale score >=67% as evidence of an improvement self-perception of function and to decrease risk for falls Mae et al., 2022).  Baseline: 57% Goal status: ONGOING    2.  Patient will perform 5 x STS in <11.4 sec to demonstrate improve LE strength to decrease risk of falling (Bohannon, 2006). Baseline: 14 sec   02/05/24:  14 sec  Goal status: ONGOING    3.  Patient will complete >=12 reps on 30 sec chair stands to exhibit improved LE endurance to decrease risk of falling.  Baseline: 11 reps   02/05/24: 11 reps    Goal status: ONGOING    4.  Patient will ambulate >=1000 ft in to show community ambulator status to be able to have aerobic endurance to return to fulfilling walking tasks at his job.  Baseline: 8/28: 553' with RW and unable to complete 6 minutes  (stopped at 5 minutes)  02/05/24: 558 ft with 2WW   Goal status: ONGOING    5.  Patient will demonstrate reduced falls risk as evidenced by Dynamic Gait Index (DGI) >19/24 to decrease risk of falling.  Baseline: 13/24 with use of single point cane in RUE    Goal status: ONGOING     6.  Patient will perform TUG in <13.5 sec as evidence of improved mobility and decreased risk for falling [facility] et al., 2000) Baseline:  8/28: 15.38 seconds with RW    Goal status: ONGOING    7. Patient will be able to ambulate safely with the least restrictive assistive device outside as  evidenced by decreased lateral sway, consistent stride length and step length, and with knowledge of when to take a break due to fatigue.  Baseline: Using 2WW or 4WW Goal status: ONGOING     8. Patient will perform in >=1.0 m./sec as evidence of improved mobility, decreased falls risk, and overall functional independence.  Baseline: 8/28: 0.61 m/s  Goal status: ONGOING   PLAN:  PT FREQUENCY: 1-2x/week  PT DURATION: 12 weeks  PLANNED INTERVENTIONS: 97164- PT Re-evaluation, 97750- Physical Performance Testing, 97110-Therapeutic exercises, 97530- Therapeutic activity, [id number]- Neuromuscular re-education, 97535- Self Care, 02859- Manual therapy, [phone]- Gait training, [phone]- Orthotic Initial, [phone]- Orthotic/Prosthetic subsequent, [phone]- Canalith repositioning, [phone]- Aquatic Therapy, [phone]- Electrical stimulation (unattended), [phone]- Electrical stimulation (manual), [id number]- Ultrasound, 79439 (1-2 muscles), [phone] (3+ muscles)- Dry Needling, Patient/Family education, Balance training, Stair training, Taping, Joint mobilization, Joint manipulation, Spinal manipulation, Spinal mobilization, Scar mobilization, Vestibular training, DME instructions, Cryotherapy, Moist heat, and Biofeedback.  PLAN FOR NEXT SESSION: Reassess goals: Dynamic Gait Index, TUG ABC scale, . Circuit training using TENS: attempt single point cane to  walk with and mix in mini-squats and standing hip abduction during resistance portion of circuits.    Toribio Servant PT, DPT  [facility] [address], [place], [address] Phone: [phone]   Fax:  [phone]

## 2024-02-06 ENCOUNTER — Ambulatory Visit: Attending: Cardiology | Admitting: Cardiology

## 2024-02-06 ENCOUNTER — Encounter: Payer: Self-pay | Admitting: Cardiology

## 2024-02-06 VITALS — BP 118/60 | HR 70 | Ht 62.0 in | Wt 236.4 lb

## 2024-02-06 DIAGNOSIS — F17200 Nicotine dependence, unspecified, uncomplicated: Secondary | ICD-10-CM | POA: Diagnosis not present

## 2024-02-06 DIAGNOSIS — I10 Essential (primary) hypertension: Secondary | ICD-10-CM | POA: Diagnosis not present

## 2024-02-06 MED ORDER — AMLODIPINE BESYLATE 5 MG PO TABS: 5.0000 mg | ORAL_TABLET | Freq: Every day | ORAL | 3 refills | Status: DC

## 2024-02-06 NOTE — Patient Instructions (Signed)
 Medication Instructions:  - STOP coreg  - START norvasc  (amlodipine )  *If you need a refill on your cardiac medications before your next appointment, please call your pharmacy*  Lab Work: No labs ordered today  If you have labs (blood work) drawn today and your tests are completely normal, you will receive your results only by: MyChart Message (if you have MyChart) OR A paper copy in the mail If you have any lab test that is abnormal or we need to change your treatment, we will call you to review the results.  Testing/Procedures: No test ordered today   Follow-Up: At [facility], you and your health needs are our priority.  As part of our continuing mission to provide you with exceptional heart care, our providers are all part of one team.  This team includes your primary Cardiologist (physician) and Advanced Practice Providers or APPs (Physician Assistants and Nurse Practitioners) who all work together to provide you with the care you need, when you need it.  Your next appointment:   2 month(s)  Provider:   You may see Redell Cave, MD or one of the following Advanced Practice Providers on your designated Care Team:   Lonni Meager, NP Lesley Maffucci, PA-C Bernardino Bring, PA-C Cadence Kep'el, PA-C Tylene Lunch, NP Barnie Hila, NP    We recommend signing up for the patient portal called MyChart.  Sign up information is provided on this After Visit Summary.  MyChart is used to connect with patients for Virtual Visits (Telemedicine).  Patients are able to view lab/test results, encounter notes, upcoming appointments, etc.  Non-urgent messages can be sent to your provider as well.   To learn more about what you can do with MyChart, go to [email or web address].

## 2024-02-06 NOTE — Telephone Encounter (Signed)
-----   Message ----- From: Rulon Reche FALCON Sent: 02/06/2024  11:50 AM EDT To: Ritta BIRCH [place]   Patient is scheduled with [employer] [facility] on 10/06 at 9:30.

## 2024-02-06 NOTE — Progress Notes (Signed)
 Cardiology Office Note:    Date:  02/06/2024   ID:  Susan [name], DOB Dec 14, 1960, MRN [id number]  PCP:  Abbey Bruckner, MD   [facility] Providers Cardiologist:  Redell Cave, MD     Referring MD: Abbey Bruckner, MD   Chief Complaint  Patient presents with   Follow-up    3 month follow up pat has been doing well with no complaints of chest pain, chest pressure or SOB, medciation reviewed verbally with patient    History of Present Illness:    Susan [name] is a 63 y.o. female with a hx of anxiety, hypertension, hyperlipidemia, current smoker x35+ years, OSA on CPAP, COPD, obesity who presents for follow-up.  Previously seen with hypertension, shortness of breath.  Etiology deemed noncardiac, likely COPD, obesity, sleep apnea.  Denies chest pain but endorses fatigue and shortness of breath since started on carvedilol  about a month ago.  Still smokes, working on quitting, down to 3 cigarettes daily.  Prior notes Echo 06/2022 EF 60 to 65%, impaired relaxation Echo 01/2019 EF 65%, normal diastolic function.  Past Medical History:  Diagnosis Date   ADD (attention deficit disorder)    Alcohol abuse    Anemia    Anxiety    Aortic atherosclerosis 02/20/2018   Chest CT Sept 2019   Arthritis    rheumatoid arthritis   Asthma    Bipolar disorder (HCC)    Centrilobular emphysema (HCC)    Chronic diarrhea 07/22/2022   Cocaine use disorder, moderate, in sustained remission (HCC)    Constipation    Degenerative disc disease at L5-S1 level 09/28/2016   See ortho note May 2018   Depression    bipolar, hx of suicide attempt   Diabetes mellitus, type 2 (HCC)    Diverticulitis 2013   DOE (dyspnea on exertion)    Drug use    Family history of adverse reaction to anesthesia    mom-delayed emergence   GERD (gastroesophageal reflux disease)    H/O suicide attempt    slit wrists   Hepatitis C 06/26/2014   treated with Harvoni   Hip pain    History of echocardiogram    a.  04/2019 Echo: EF >65%, nl RV fxn.   History of MRSA infection 2013   HLD (hyperlipidemia)    Hypertension    a. 06/2021 Renal Duplex: ? bilat RAS; b. 06/2021 CTA Abd: No signif RAS.   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   Status post total right knee replacement using cement 05/10/2016   Stroke (HCC) 07/05/2022   right arm,torso and right leg numbness   Tobacco use disorder    Tobacco use disorder, continuous 06/26/2014   Vitamin B12 deficiency    Vitamin D  deficiency 04/09/2015    Past Surgical History:  Procedure Laterality Date   BILATERAL SALPINGOOPHORECTOMY     due to abnormal mass   BREAST BIOPSY Left    neg   BREAST SURGERY Left 20 yrs ago   CESAREAN SECTION     COLONOSCOPY     COLONOSCOPY  WITH PROPOFOL  N/A 10/16/2017   Procedure: COLONOSCOPY WITH PROPOFOL ;  Surgeon: Therisa Bi, MD;  Location: [facility] ENDOSCOPY;  Service: Gastroenterology;  Laterality: N/A;   COLONOSCOPY WITH PROPOFOL  N/A 02/16/2021   Procedure: COLONOSCOPY WITH PROPOFOL ;  Surgeon: Therisa Bi, MD;  Location: [facility] ENDOSCOPY;  Service: Gastroenterology;  Laterality: N/A;   FORAMINOTOMY 1 LEVEL Right 10/10/2022   Procedure: RIGHT L4-5 LAMINOFORAMINOTOMY;  Surgeon: Clois Fret, MD;  Location: [facility] ORS;  Service: Neurosurgery;  Laterality: Right;   HERNIA REPAIR  06/2011, July 2014   Ventral wall repair with Physiomesh   HERNIA REPAIR     2nd.vental wall repair   JOINT REPLACEMENT Right    knee   LUMBAR LAMINECTOMY/ DECOMPRESSION WITH MET-RX N/A 10/12/2022   Procedure: LUMBAR LAMINECTOMY/ DECOMPRESSION WITH MET-RX;  Surgeon: Clois Fret, MD;  Location: [facility] ORS;  Service: Neurosurgery;  Laterality: N/A;   TONSILLECTOMY     TOTAL HIP ARTHROPLASTY Left 07/23/2019   Procedure: TOTAL HIP ARTHROPLASTY;  Surgeon: Edie Norleen PARAS, MD;  Location: [facility] ORS;  Service: Orthopedics;  Laterality: Left;   TOTAL KNEE ARTHROPLASTY Right 05/10/2016   Procedure: TOTAL KNEE ARTHROPLASTY;  Surgeon: Norleen PARAS Edie, MD;  Location: [facility] ORS;  Service: Orthopedics;  Laterality: Right;   TUBAL LIGATION      Current Medications: Current Meds  Medication Sig   acetaminophen  (TYLENOL ) 500 MG tablet Take 1,000 mg by mouth 2 (two) times daily.   amLODipine  (NORVASC ) 5 MG tablet Take 1 tablet (5 mg total) by mouth daily.   aspirin  EC 81 MG tablet Take 1 tablet (81 mg total) by mouth daily. *Swallow whole*   bisacodyl  (ONELAX) 10 MG suppository Unwrap and insert 1 suppository (10 mg total) rectally as needed for moderate constipation. *If no bowel movement with morning colace.*   clotrimazole  (LOTRIMIN ) 1 % cream Apply 1 application  topically 2 (two) times daily as needed.   cyanocobalamin  (VITAMIN B12) 100 MCG tablet Take 1 tablet (100 mcg total) by mouth daily.   cyclobenzaprine  (FLEXERIL ) 5 MG tablet Take 0.5-1 tablets (2.5-5 mg total) by mouth 2 (two) times daily as needed.   DULoxetine  (CYMBALTA ) 20 MG capsule Take 1 capsule (20 mg total) by mouth daily. *Take with 30 mg capsule*   DULoxetine  (CYMBALTA ) 30 MG capsule Take 1 capsule (30 mg total) by mouth daily. *Take with 20 mg capsule.*   gabapentin  (NEURONTIN ) 300 MG capsule Take 2 capsules (600 mg total) by mouth 2 (two) times daily.   HYDROcodone -acetaminophen  (NORCO/VICODIN) 5-325 MG tablet Take 1 tablet by mouth daily as needed for moderate pain.   lurasidone  (LATUDA ) 20 MG TABS tablet Take 1 tablet (20 mg total) by mouth daily with supper.   naloxone (NARCAN) nasal spray 4 mg/0.1 mL Place into the nose.   rosuvastatin  (CRESTOR ) 20 MG tablet Take 1 tablet (20 mg total) by mouth daily.    Semaglutide , 2 MG/DOSE, (OZEMPIC , 2 MG/DOSE,) 8 MG/3ML SOPN Inject 2 mg into the skin once a week.   Semaglutide , 2 MG/DOSE, (OZEMPIC , 2 MG/DOSE,) 8 MG/3ML SOPN Inject 2mg  under the skin once a week   Semaglutide , 2 MG/DOSE, (OZEMPIC , 2 MG/DOSE,) 8 MG/3ML SOPN Inject 2 mg into the skin once a week.   telmisartan  (MICARDIS ) 80 MG tablet Take 1 tablet (80 mg total) by mouth daily.   traZODone  (DESYREL ) 50 MG tablet Take 1 tablet by mouth at bedtime as needed for sleep. for sleep   Vibegron  (GEMTESA ) 75 MG TABS Take 1 tablet (75 mg total) by mouth  daily.   vitamin B-12 (CYANOCOBALAMIN ) 100 MCG tablet Take 1 tablet (100 mcg total) by mouth daily.   [DISCONTINUED] carvedilol  (COREG ) 12.5 MG tablet Take 1 tablet (12.5 mg total) by mouth 2 (two) times daily.     Allergies:   Wellbutrin  [bupropion ], Furosemide, and Hydrochlorothiazide    Social History   Socioeconomic History   Marital status: Widowed    Spouse name: Not on file   Number of children: 3   Years of education: Not on file   Highest education level: Some college, no degree  Occupational History   Occupation: disabled  Tobacco Use   Smoking status: Some Days    Current packs/day: 0.50    Average packs/day: 0.5 packs/day for 48.7 years (24.4 ttl pk-yrs)    Types: Cigarettes    Start date: 15   Smokeless tobacco: Never  Vaping Use   Vaping status: Every Day   Substances: Nicotine   Substance and Sexual Activity   Alcohol use: No    Alcohol/week: 0.0 standard drinks of 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   Drug use: No    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   Sexual activity: Not Currently  Other Topics Concern   Not on file  Social History Narrative   Not on file   Social Drivers of Health   Financial Resource Strain: Low Risk  (03/29/2023)   Overall Financial Resource Strain (CARDIA)    Difficulty of Paying Living Expenses: Not hard at all  Food Insecurity: No Food Insecurity (03/29/2023)    Hunger Vital Sign    Worried About Running Out of Food in the Last Year: Never true    Ran Out of Food in the Last Year: Never true  Transportation Needs: No Transportation Needs (03/29/2023)   PRAPARE - Administrator, Civil Service (Medical): No    Lack of Transportation (Non-Medical): No  Physical Activity: Inactive (03/29/2023)   Exercise Vital Sign    Days of Exercise per Week: 0 days    Minutes of Exercise per Session: 0 min  Stress: No Stress Concern Present (03/29/2023)   [employer] of Occupational Health - Occupational Stress Questionnaire    Feeling of Stress : Only a little  Social Connections: Moderately Isolated (03/29/2023)   Social Connection and Isolation Panel    Frequency of Communication with Friends and Family: Twice a week    Frequency of Social Gatherings with Friends and Family: More than three times a week    Attends Religious Services: Never    Database administrator or Organizations: Yes    Attends Engineer, structural: More than 4 times per year    Marital Status: Widowed     Family History: The patient's family history includes Alcohol abuse in her father, mother, sister, sister, son, and son; Arthritis in her brother and mother; Asthma in her mother; Bipolar disorder in her mother; COPD in her mother; Cancer in her brother; Congestive Heart Failure in her mother; Depression in her sister; Drug abuse in her daughter, sister, sister, son, and son; Eating disorder in her mother; Fibromyalgia in her sister; Heart attack in her father; Heart disease in her mother; Mental illness in her brother, mother, sister, sister, and sister; Obesity in her sister; Pneumonia in her sister; Thyroid  disease in her mother. There is no history of Diabetes, Stroke, Breast cancer, or Thyroid  cancer.  ROS:   Please see the history of present illness.  All other systems reviewed and are negative.  EKGs/Labs/Other Studies Reviewed:    The following  studies were reviewed today:   EKG:  EKG is  ordered today.  The ekg ordered today demonstrates normal sinus rhythm, normal ECG  Recent Labs: 03/28/2023: TSH 1.06 12/04/2023: ALT 10; BUN 10; Creat 1.35; Potassium 4.3; Sodium 137 12/22/2023: Hemoglobin 11.3; Platelets 219.0  Recent Lipid Panel    Component Value Date/Time   CHOL 98 12/04/2023 1318   CHOL 158 07/26/2018 1101   TRIG 87 12/04/2023 1318   HDL 42 (L) 12/04/2023 1318   HDL 44 07/26/2018 1101   CHOLHDL 2.3 12/04/2023 1318   VLDL 33 07/06/2022 0528   LDLCALC 39 12/04/2023 1318   LDLDIRECT 38.0 09/28/2022 0925     Risk Assessment/Calculations:         Physical Exam:    VS:  BP 118/60 (BP Location: Left Arm, Patient Position: Sitting, Cuff Size: Normal)   Pulse 70   Ht 5' 2 (1.575 m)   Wt 236 lb 6.4 oz (107.2 kg)   SpO2 97%   BMI 43.24 kg/m     Wt Readings from Last 3 Encounters:  02/06/24 236 lb 6.4 oz (107.2 kg)  01/30/24 224 lb (101.6 kg)  01/24/24 224 lb (101.6 kg)     GEN:  Well nourished, well developed in no acute distress HEENT: Normal NECK: No JVD; No carotid bruits LYMPHATICS: No lymphadenopathy CARDIAC: RRR, no murmurs, rubs, gallops RESPIRATORY:  Clear to auscultation without rales, wheezing or rhonchi  ABDOMEN: Soft, non-tender, non-distended MUSCULOSKELETAL:  No edema; No deformity  SKIN: Warm and dry NEUROLOGIC:  Alert and oriented x 3 PSYCHIATRIC:  Normal affect   ASSESSMENT:    1. Primary hypertension   2. Current smoker   3. Morbid obesity (HCC)    PLAN:    In order of problems listed above:  Hypertension.  BP controlled.  But complains of fatigue since starting beta-blocker.  Stop Coreg , start Norvasc  5 mg daily, continue telmisartan  80 mg daily. Current smoker, smoking cessation advised.. Morbid obesity, low-calorie diet, increase activity, weight loss recommended.  Agree with GLP-1.  Follow-up in 6 to 8 weeks  Medication Adjustments/Labs and Tests Ordered: Current  medicines are reviewed at length with the patient today.  Concerns regarding medicines are outlined above.  No orders of the defined types were placed in this encounter.  Meds ordered this encounter  Medications   amLODipine  (NORVASC ) 5 MG tablet    Sig: Take 1 tablet (5 mg total) by mouth daily.    Dispense:  180 tablet    Refill:  3    Patient Instructions  Medication Instructions:  - STOP coreg  - START norvasc  (amlodipine )  *If you need a refill on your cardiac medications before your next appointment, please call your pharmacy*  Lab Work: No labs ordered today  If you have labs (blood work) drawn today and your tests are completely normal, you will receive your results only by: MyChart Message (if you have MyChart) OR A paper copy in the mail If you have any lab test that is abnormal or we need to change your treatment, we will call you to review the results.  Testing/Procedures: No test ordered today   Follow-Up: At [facility], you and your health needs are our priority.  As part of our continuing mission to provide you with exceptional heart care, our providers are all part of one team.  This team includes your primary Cardiologist (physician) and  Advanced Practice Providers or APPs (Physician Assistants and Nurse Practitioners) who all work together to provide you with the care you need, when you need it.  Your next appointment:   2 month(s)  Provider:   You may see Redell Cave, MD or one of the following Advanced Practice Providers on your designated Care Team:   Lonni Meager, NP Lesley Maffucci, PA-C Bernardino Bring, PA-C Cadence Hardin, PA-C Tylene Lunch, NP Barnie Hila, NP    We recommend signing up for the patient portal called MyChart.  Sign up information is provided on this After Visit Summary.  MyChart is used to connect with patients for Virtual Visits (Telemedicine).  Patients are able to view lab/test results, encounter notes,  upcoming appointments, etc.  Non-urgent messages can be sent to your provider as well.   To learn more about what you can do with MyChart, go to [email or web address].            Signed, Redell Cave, MD  02/06/2024 9:34 AM    [facility]

## 2024-02-06 NOTE — Telephone Encounter (Signed)
 Patient called back- she was starting her online process with [employer] but needed assistance. I gave her their number to call, she will call us  back with an update

## 2024-02-06 NOTE — Telephone Encounter (Signed)
 Left voicemail for patient- please inform her that Dr.Eappen can perform the eval but we do not have the exact protocols insurance will require.  If their office is unsure, advise patient [employer] may be best.

## 2024-02-07 ENCOUNTER — Ambulatory Visit
Admission: RE | Admit: 2024-02-07 | Discharge: 2024-02-07 | Disposition: A | Discharge status: Home / Self Care | Source: Ambulatory Visit | Attending: Neurosurgery | Admitting: Neurosurgery

## 2024-02-07 DIAGNOSIS — M5124 Other intervertebral disc displacement, thoracic region: Secondary | ICD-10-CM | POA: Diagnosis not present

## 2024-02-07 DIAGNOSIS — M961 Postlaminectomy syndrome, not elsewhere classified: Secondary | ICD-10-CM

## 2024-02-07 DIAGNOSIS — M4314 Spondylolisthesis, thoracic region: Secondary | ICD-10-CM | POA: Diagnosis not present

## 2024-02-07 DIAGNOSIS — M47814 Spondylosis without myelopathy or radiculopathy, thoracic region: Secondary | ICD-10-CM | POA: Diagnosis not present

## 2024-02-08 ENCOUNTER — Ambulatory Visit: Attending: Neurosurgery | Admitting: Physical Therapy

## 2024-02-08 DIAGNOSIS — R262 Difficulty in walking, not elsewhere classified: Secondary | ICD-10-CM | POA: Insufficient documentation

## 2024-02-08 DIAGNOSIS — M5459 Other low back pain: Secondary | ICD-10-CM | POA: Insufficient documentation

## 2024-02-08 DIAGNOSIS — R2689 Other abnormalities of gait and mobility: Secondary | ICD-10-CM | POA: Insufficient documentation

## 2024-02-08 DIAGNOSIS — M6281 Muscle weakness (generalized): Secondary | ICD-10-CM | POA: Insufficient documentation

## 2024-02-08 NOTE — Therapy (Signed)
 OUTPATIENT PHYSICAL THERAPY THORACOLUMBAR AND BALANCE  TREATMENT   Patient Name: Susan [name] MRN: [id number] DOB:July 29, 1960, 63 y.o., female Today's Date: 02/08/2024  END OF SESSION:  PT End of Session - 02/08/24 1122     Visit Number 7    Number of Visits 24    Date for Recertification  03/25/24    Authorization Type [facility] [place] Medicare Dual 2025    Authorization - Visit Number 7    Authorization - Number of Visits 24    Progress Note Due on Visit 10    PT Start Time 1115    PT Stop Time 1200    PT Time Calculation (min) 45 min    Activity Tolerance Patient tolerated treatment well    Behavior During Therapy [facility] for tasks assessed/performed             Past Medical History:  Diagnosis Date   ADD (attention deficit disorder)    Alcohol abuse    Anemia    Anxiety    Aortic atherosclerosis 02/20/2018   Chest CT Sept 2019   Arthritis    rheumatoid arthritis   Asthma    Bipolar disorder (HCC)    Centrilobular emphysema (HCC)    Chronic diarrhea 07/22/2022   Cocaine use disorder, moderate, in sustained remission (HCC)    Constipation    Degenerative disc disease at L5-S1 level 09/28/2016   See ortho note May 2018   Depression    bipolar, hx of suicide attempt   Diabetes mellitus, type 2 (HCC)    Diverticulitis 2013   DOE (dyspnea on exertion)    Drug use    Family history of adverse reaction to anesthesia    mom-delayed emergence   GERD (gastroesophageal reflux disease)    H/O suicide attempt    slit wrists   Hepatitis C 06/26/2014   treated with Harvoni   Hip pain    History of echocardiogram    a. 04/2019 Echo: EF >65%, nl RV fxn.   History of MRSA infection 2013   HLD (hyperlipidemia)    Hypertension    a. 06/2021 Renal Duplex: ? bilat RAS; b. 06/2021 CTA Abd: No signif RAS.   Hyponatremia    Hypothyroidism    Incisional hernia 11/08/2012   Knee pain    Left carotid bruit    Morbid obesity (HCC)    Multinodular thyroid     OSA (obstructive sleep  apnea)    OSA on CPAP    Osteoporosis    Palpitations    Post-traumatic osteoarthritis of right knee 09/24/2015   Recurrent ventral hernia 11/08/2012   Skin lesion 06/28/2022   Status post total hip replacement, left 07/23/2019   Status post total right knee replacement using cement 05/10/2016   Stroke (HCC) 07/05/2022   right arm,torso and right leg numbness   Tobacco use disorder    Tobacco use disorder, continuous 06/26/2014   Vitamin B12 deficiency    Vitamin D  deficiency 04/09/2015   Past Surgical History:  Procedure Laterality Date   BILATERAL SALPINGOOPHORECTOMY     due to abnormal mass   BREAST BIOPSY Left    neg   BREAST SURGERY Left 20 yrs ago   CESAREAN SECTION     COLONOSCOPY     COLONOSCOPY WITH PROPOFOL  N/A 10/16/2017   Procedure: COLONOSCOPY WITH PROPOFOL ;  Surgeon: Therisa Bi, MD;  Location: [facility] ENDOSCOPY;  Service: Gastroenterology;  Laterality: N/A;   COLONOSCOPY WITH PROPOFOL  N/A 02/16/2021   Procedure: COLONOSCOPY WITH PROPOFOL ;  Surgeon: Therisa,  Ruel, MD;  Location: [facility] ENDOSCOPY;  Service: Gastroenterology;  Laterality: N/A;   FORAMINOTOMY 1 LEVEL Right 10/10/2022   Procedure: RIGHT L4-5 LAMINOFORAMINOTOMY;  Surgeon: Clois Fret, MD;  Location: [facility] ORS;  Service: Neurosurgery;  Laterality: Right;   HERNIA REPAIR  06/2011, July 2014   Ventral wall repair with Physiomesh   HERNIA REPAIR     2nd.vental wall repair   JOINT REPLACEMENT Right    knee   LUMBAR LAMINECTOMY/ DECOMPRESSION WITH MET-RX N/A 10/12/2022   Procedure: LUMBAR LAMINECTOMY/ DECOMPRESSION WITH MET-RX;  Surgeon: Clois Fret, MD;  Location: [facility] ORS;  Service: Neurosurgery;  Laterality: N/A;   TONSILLECTOMY     TOTAL HIP ARTHROPLASTY Left 07/23/2019   Procedure: TOTAL HIP ARTHROPLASTY;  Surgeon: Edie Norleen PARAS, MD;  Location: [facility] ORS;  Service: Orthopedics;  Laterality: Left;   TOTAL KNEE ARTHROPLASTY Right 05/10/2016   Procedure: TOTAL KNEE ARTHROPLASTY;  Surgeon: Norleen PARAS Edie,  MD;  Location: [facility] ORS;  Service: Orthopedics;  Laterality: Right;   TUBAL LIGATION     Patient Active Problem List   Diagnosis Date Noted   Urge incontinence 12/22/2023   Painful and cold lower extremity 12/22/2023   Hemiplegia and hemiparesis following cerebral infarction affecting right dominant side (HCC) 12/04/2023   CKD (chronic kidney disease) stage 3, GFR 30-59 ml/min (HCC) 12/04/2023   Dysuria 11/03/2023   History of stroke 04/17/2023   Right leg weakness 04/17/2023   Neurogenic bladder 02/13/2023   Abnormality of gait and mobility 02/13/2023   Morbid obesity with body mass index (BMI) of 40.0 to 44.9 in adult (HCC) 01/10/2023   Left thyroid  nodule 01/10/2023   Bipolar 1 disorder, depressed, moderate (HCC) 11/29/2022   Normocytic normochromic anemia 10/13/2022   Epidural hematoma (HCC) 10/12/2022   Mass in epidural space 10/12/2022   Synovial cyst of lumbar facet joint 10/10/2022   Lumbar radiculopathy 10/10/2022   S/P laminectomy 10/10/2022   Epigastric pain 07/22/2022   Enlarged thyroid  07/22/2022   CVA (cerebrovascular accident) (HCC) 07/05/2022   Type II diabetes mellitus with nephropathy (HCC) 06/16/2021   H/O thyroid  nodule 05/31/2021   History of hepatitis C- treated 2014- 2015 with [facility] gastroenterology and followed.  05/19/2021   Left carotid bruit 04/28/2021   Bipolar disorder, in full remission, most recent episode depressed 02/05/2021   Anxiety 05/28/2020   Bipolar affective disorder in remission 05/28/2020   Bipolar disorder, in full remission, most recent episode mixed 03/06/2020   Need for immunization against influenza 01/27/2020   Morbid obesity (HCC) 01/27/2020   Osteoporosis 11/06/2019   Bipolar 1 disorder, mixed, mild (HCC) 10/29/2019   PTSD (post-traumatic stress disorder) 09/27/2019   Bereavement 09/27/2019   Cocaine use disorder, moderate, in sustained remission (HCC) 09/27/2019   Primary osteoarthritis of left hip 06/14/2019   Allergic  rhinitis 08/23/2018   Aortic atherosclerosis 02/20/2018   Centrilobular emphysema (HCC) 02/20/2018   Hyperlipidemia 03/28/2017   Degenerative disc disease at L5-S1 level 09/28/2016   GERD (gastroesophageal reflux disease) 12/17/2015   Chronic back pain 10/09/2015   Post-traumatic osteoarthritis of right knee 09/24/2015   Alcohol use disorder, severe, in sustained remission (HCC) 04/13/2015   OSA on CPAP 04/09/2015   Subclinical hyperthyroidism 06/26/2014   Goiter colloid, toxic, nodular 06/26/2014   Hypertension 06/26/2014   Bipolar I disorder (HCC) 06/26/2014   Lumbar radiculitis 10/03/2013   Diverticulosis 08/24/2013    PCP: Dr. Luke Shade     REFERRING PROVIDER: Dr. Fret Clois    REFERRING DIAG: M54.50,G89.29 (ICD-10-CM) - Chronic  bilateral low back pain without sciatica  Rationale for Evaluation and Treatment: Rehabilitation  THERAPY DIAG:  Other low back pain  Imbalance  Difficulty in walking, not elsewhere classified  ONSET DATE: 10/2022  SUBJECTIVE:                                                                                                                                                                                           SUBJECTIVE STATEMENT:   Pt reports that she had follow up for the spinal stimulator and that she is following up about getting TENS unit through insurance. She is experiencing more     PERTINENT HISTORY:  She has prior history of left sided stroke affecting her right side in February 2024. She also had a L3-L4 lumbar decompression surgery in June 2024 with complications due to a epidural hematoma that resulted in cauda equinda syndrome and her needing to return to ER for evacuation and she was in the hospital for an additional 8 days following surgery.  She is just moving back to [place] from [place], because she has qualified for housing in [place]. She describes being most concerned with her balance especially when  walking over uneven surfaces. She mainly uses a a 2WW when walking to places that she has to drive to because it is easier to transport. She has not had a fall in the past 6 mo, but she is scared of falling especially when walking over uneven surfaces. She does report an improvement in LE function with her now being able to raise her toes and that she was not able to do this after surgery.   PAIN:  Are you having pain? Yes: NPRS scale: 6/10 NRPS on central spinous process of L1-L5    Pain location: L1-L5 Central Spinous   Pain description: Dull   Aggravating factors: Tends to hurt more as the day goes on or the more active she is. Relieving factors: 5 x 325 Viacodin, Alieve  as needed     PRECAUTIONS: Fall; she is afraid of falling    RED FLAGS: None   WEIGHT BEARING RESTRICTIONS: No  FALLS:  Has patient fallen in last 6 months? No  LIVING ENVIRONMENT: Lives with: lives alone Lives in: House/apartment Stairs: Yes: External: 4 steps; on right going up and there is a ramp going to a house and there is a ramped main entrance leading to lobby. Place is called [employer]   Has following equipment at home: Single point cane, Environmental consultant - 2 wheeled, Environmental consultant - 4 wheeled, shower chair, Grab bars, and Ramped entry  OCCUPATION: Retired   PLOF: Independent  PATIENT GOALS:  Pt wants to be able to walk outside using single point cane and to feel more confidence when walking especially over uneven surfaces. She currently uses rollator to walk dog at home and 2WW when walking anywhere where she has to drive.   NEXT MD VISIT: October 2025     OBJECTIVE:  Note: Objective measures were completed at Evaluation unless otherwise noted.  VITALS BP 118/66 HR  60 Sp02  97%  DIAGNOSTIC FINDINGS:  CLINICAL DATA:  Low back pain. Cauda equina syndrome suspected. Cyst removal from the spine two days ago. Postoperative urinary incontinence and bilateral leg numbness.   EXAM: MRI LUMBAR SPINE WITHOUT  CONTRAST   TECHNIQUE: Multiplanar, multisequence MR imaging of the lumbar spine was performed. No intravenous contrast was administered.   COMPARISON:  05/18/2022   FINDINGS: Segmentation: 5 lumbar type vertebral bodies as numbered previously.   Alignment: Scoliotic curvature convex to the right as seen previously. No significant listhesis.   Vertebrae: Edematous change of the endplates at T10, U88 and T12, likely to relate to regional pain. This is probably degenerative. Lesser vertebral endplate edema noted at L3-4 and L4-5.   Conus medullaris and cauda equina: Conus extends to the L1 level. Conus and cauda equina appear normal.   Paraspinal and other soft tissues: Posterior surgical approach at the L4-5 level from the right-side has an expected appearance.   Disc levels:   At the surgical level and extending within the spinal canal from the upper L3 level to the L 4 5 disc level, there is a fluid collection which causes marked compression of the thecal sac. This is most severe at the level of the L3-4 disc where the fluid collection within the canal measures 1.8 x 1.5 cm in diameter and markedly flattens the thecal sac anteriorly.   Other than the surgical decompression of the synovial cyst on the right and the postoperative intraspinal fluid collection, there is no change since the preoperative exam, as one might expect.   IMPRESSION: 1. At the surgical level and extending from the upper L3 level to the L4-5 disc level, there is a postoperative fluid collection within the spinal canal which causes marked compression of the thecal sac anteriorly. This is most severe at the level of the L3-4 disc where the fluid collection within the canal measures 1.8 x 1.5 cm in diameter and markedly flattens the thecal sac anteriorly.     Electronically Signed   By: Oneil Officer M.D.   On: 10/12/2022 16:20  PATIENT SURVEYS:  ABC scale: The Activities-Specific Balance Confidence  (ABC) Scale 0% 10 20 30  40 50 60 70 80 90 100% No confidence<->completely confident  "How confident are you that you will not lose your balance or become unsteady when you . . .   Date tested 01/01/24  Walk around the house 80%  2. Walk up or down stairs 80%  3. Bend over and pick up a slipper from in front of a closet floor 60%  4. Reach for a small can off a shelf at eye level 90%  5. Stand on tip toes and reach for something above your head 0%  6. Stand on a chair and reach for something 40%  7. Sweep the floor 50%  8. Walk outside the house to a car parked in the driveway 90%  9. Get into or out of a car 80%  10. Walk across a parking lot to the mall 80%  11. Walk up or down a ramp  80%  12. Walk in a crowded mall where people rapidly walk past you 60%  13. Are bumped into by people as you walk through the mall 40%  14. Step onto or off of an escalator while you are holding onto the railing 50%  15. Step onto or off an escalator while holding onto parcels such that you cannot hold onto the railing 30%  16. Walk outside on icy sidewalks 0%  Total: #/16  57% (910/1600)       COGNITION: Overall cognitive status: Within functional limits for tasks assessed     SENSATION: Light touch: Impaired electricity down her RLE   MUSCLE LENGTH: Hamstrings: Not tested   Debby test: Not tested    POSTURE: rounded shoulders  PALPATION: Not performed    LUMBAR ROM:   AROM eval  Flexion 80%  Extension 100%  Right lateral flexion 100%  Left lateral flexion 100%  Right rotation 100%  Left rotation 100%   (Blank rows = not tested)  LOWER EXTREMITY ROM:     Active  Right eval Left eval  Hip flexion    Hip extension    Hip abduction    Hip adduction    Hip internal rotation    Hip external rotation    Knee flexion    Knee extension    Ankle dorsiflexion    Ankle plantarflexion    Ankle inversion    Ankle eversion     (Blank rows = not tested)  LOWER EXTREMITY MMT:     MMT Right eval Left eval  Hip flexion 4- 4  Hip extension    Hip abduction    Hip adduction    Hip internal rotation    Hip external rotation    Knee flexion 4 4  Knee extension 4 4  Ankle dorsiflexion 4 4  Ankle plantarflexion    Ankle inversion    Ankle eversion     (Blank rows = not tested)    FUNCTIONAL TESTS:  5 times sit to stand: 14 sec  30 seconds chair stand test: 11 reps   Timed up and go (TUG): 15.38 sec with use of single point cane   6 minute walk test: 553 ft  10 meter walk test: 0.61 m/sec                 DGI:  13/26                       <=19/24  predictive of falls in the elderly     GAIT: Distance walked: 30 ft    Assistive device utilized: Walker - 2 wheeled Level of assistance: Modified independence Comments: Significant use of BUE support with decrease step length bilaterally.    TREATMENT DATE:   02/08/24 TENS throughout all activity setting at 5 with box technique   [facility]    Overground ambulation with BUE support using 2WW 30 ft  x 5    Overground ambulation with use of single point cane in RUE 25 ft x 5   Sit to Stand at 18 inch mat height 2 x 10   -min VC to increase speed of concentric phase of exercise and to decrease speed of eccentric phase  GAIT TRAINING: Without shoes on to see position of toes and ankle in space   Static swing limb progression of RLE with focus on heel contact placement and dorsiflexion with BUE support using 2WW  3 x 20  -visual input  provided to have patient attempt to mimic movement of left ankle and toes in swing phase   Static swing limb progression of RLE with RUE support using SPC with    PATIENT EDUCATION:  Education details: Form and technique for correct performance of exercise and explanation about cut-offs for functional tests.  Person educated: Patient Education method: Explanation, Demonstration, Verbal cues, and Handouts Education comprehension: verbalized understanding, returned demonstration,  and verbal cues required  HOME EXERCISE PROGRAM: Access Code: [email or web address] URL: [email or web address] Date: 02/05/2024 Prepared by: Toribio Servant  Exercises - Seated Hamstring Stretch  - 1 x daily - 7 x weekly - 2-3 reps - 60 sec hold - Seated Hamstring Stretch (Mirrored)  - 1 x daily - 7 x weekly - 2-3 reps - 60 sec hold - Alternating Step Taps with Counter Support (Mirrored)  - 5-7 x weekly - 3 sets - 20 reps - Standing Hip Abduction with Counter Support  - 2-3 x daily - 5-7 x weekly - 3 sets - 10 reps - Standing Hip Extension with Counter Support  - 2-3 x daily - 5-7 x weekly - 3 sets - 10 reps - Sit to Stand  - 3-4 x weekly - 3 sets - 10 reps - Standing Ankle Dorsiflexion with Table Support  - 5-7 x weekly - 3 sets - 20 reps - Seated Slump Neural Tensioner  - 1 x daily - 7 x weekly - 1 sets - 10 reps  ASSESSMENT:  CLINICAL IMPRESSION:   Pt continues to show ongoing positive response to use of TENS unit to decrease pain with activity. She does continue to exhibit gait deficits on RLE with decreased proprioception of right foot especially with swing limb progression. PT provided exercises focused on gait training to improve gait mechanics of RLE especially during swing phase to be more symmetrical with left foot and to improve proprioception of RLE to improve toe clearance during swing phase. PT also focused on sit to stands and walking to promote increase LE strength and RLE proprioception to improve overall stability while walking and to decrease risk of falling.  She will continue to benefit from skilled PT to improve motor control and strength of lower extremities to improve dynamic balance and gait stability to avoid falling and to decrease level of assistive device to return to her prior level of function.         OBJECTIVE IMPAIRMENTS: Abnormal gait, decreased endurance, decreased ROM, decreased strength, impaired flexibility, impaired sensation, obesity, and pain.    ACTIVITY LIMITATIONS: carrying, lifting, bending, standing, squatting, stairs, dressing, hygiene/grooming, and locomotion level  PARTICIPATION LIMITATIONS: meal prep, cleaning, laundry, shopping, community activity, and yard work  PERSONAL FACTORS: Fitness, Past/current experiences, Time since onset of injury/illness/exacerbation, and 3+ comorbidities: h/o stroke with right sided deficits, L3-L5 decompression, HLD, HTN are also affecting patient's functional outcome.   REHAB POTENTIAL: Fair multiple co-morbidities and chronicity of condition  CLINICAL DECISION MAKING: Evolving/moderate complexity  EVALUATION COMPLEXITY: Moderate   GOALS: Goals reviewed with patient? No  SHORT TERM GOALS: Target date: 01/15/2024  Patient will demonstrate undestanding of home exercise plan by performing exercises correctly with evidence of good carry over with min to no verbal or tactile cues .   Baseline: NT  01/16/24: Performing Independently  Goal status: ACHIEVED     2.   Pt will increase by at least 4m (175ft) in order to demonstrate clinically significant improvement in cardiopulmonary endurance and community ambulation Baseline: 8/28: 553' with RW and unable to  complete 6 minutes (stopped at 5 minutes)   Goal status: NOT MET     3.  Patient will show decreased risk of falling as evidence by decrease in self-selected gait speed by >= 0.12 m/sec.  Baseline: 8/28: 0.61 m/s Goal status: NOT MET    4.  Pt will decrease 5TSTS by at least 3 seconds to demonstrate a minimal clinically significant improvement in LE strength.  Baseline: 14 seconds  Goal status: NOT MET     LONG TERM GOALS: Target date: 03/25/2024  Patient will improve activities specific balance scale score >=67% as evidence of an improvement self-perception of function and to decrease risk for falls Mae et al., 2022).  Baseline: 57% Goal status: ONGOING    2.  Patient will perform 5 x STS in <11.4 sec to demonstrate  improve LE strength to decrease risk of falling (Bohannon, 2006). Baseline: 14 sec   02/05/24:  14 sec  Goal status: ONGOING    3.  Patient will complete >=12 reps on 30 sec chair stands to exhibit improved LE endurance to decrease risk of falling.  Baseline: 11 reps   02/05/24: 11 reps    Goal status: ONGOING    4.  Patient will ambulate >=1000 ft in to show community ambulator status to be able to have aerobic endurance to return to fulfilling walking tasks at his job.  Baseline: 8/28: 553' with RW and unable to complete 6 minutes (stopped at 5 minutes)  02/05/24: 558 ft with 2WW   Goal status: ONGOING    5.  Patient will demonstrate reduced falls risk as evidenced by Dynamic Gait Index (DGI) >19/24 to decrease risk of falling.  Baseline: 13/24 with use of single point cane in RUE    Goal status: ONGOING     6.  Patient will perform TUG in <13.5 sec as evidence of improved mobility and decreased risk for falling [facility] et al., 2000) Baseline:  8/28: 15.38 seconds with RW    Goal status: ONGOING    7. Patient will be able to ambulate safely with the least restrictive assistive device outside as  evidenced by decreased lateral sway, consistent stride length and step length, and with knowledge of when to take a break due to fatigue.  Baseline: Using 2WW or 4WW Goal status: ONGOING     8. Patient will perform in >=1.0 m./sec as evidence of improved mobility, decreased falls risk, and overall functional independence.  Baseline: 8/28: 0.61 m/s  Goal status: ONGOING   PLAN:  PT FREQUENCY: 1-2x/week  PT DURATION: 12 weeks  PLANNED INTERVENTIONS: [id number]- PT Re-evaluation, 97750- Physical Performance Testing, 97110-Therapeutic exercises, 97530- Therapeutic activity, [id number]- Neuromuscular re-education, 97535- Self Care, [id number]- Manual therapy, [phone]- Gait training, [phone]- Orthotic Initial, [phone]- Orthotic/Prosthetic subsequent, [phone]- Canalith repositioning, [phone]- Aquatic  Therapy, [phone]- Electrical stimulation (unattended), [phone]- Electrical stimulation (manual), [id number]- Ultrasound, 79439 (1-2 muscles), [phone] (3+ muscles)- Dry Needling, Patient/Family education, Balance training, Stair training, Taping, Joint mobilization, Joint manipulation, Spinal manipulation, Spinal mobilization, Scar mobilization, Vestibular training, DME instructions, Cryotherapy, Moist heat, and Biofeedback.  PLAN FOR NEXT SESSION: Reassess goals: Dynamic Gait Index, TUG ABC scale, . Circuit training using TENS: attempt single point cane to walk with and mix in mini-squats and standing hip abduction during resistance portion of circuits.    Toribio Servant PT, DPT  [facility] [address], [place], [address] Phone: [phone]   Fax:  [phone]

## 2024-02-09 ENCOUNTER — Other Ambulatory Visit: Payer: Self-pay

## 2024-02-12 ENCOUNTER — Ambulatory Visit: Admitting: Physical Therapy

## 2024-02-12 DIAGNOSIS — M5459 Other low back pain: Secondary | ICD-10-CM

## 2024-02-12 DIAGNOSIS — R2689 Other abnormalities of gait and mobility: Secondary | ICD-10-CM

## 2024-02-12 DIAGNOSIS — R262 Difficulty in walking, not elsewhere classified: Secondary | ICD-10-CM

## 2024-02-12 NOTE — Therapy (Signed)
 OUTPATIENT PHYSICAL THERAPY THORACOLUMBAR AND BALANCE  TREATMENT   Patient Name: Susan [name] MRN: [id number] DOB:October 28, 1960, 63 y.o., female Today's Date: 02/12/2024  END OF SESSION:  PT End of Session - 02/12/24 1531     Visit Number 8    Number of Visits 24    Date for Recertification  03/25/24    Authorization Type [facility] Medicare Dual 2025    Authorization - Visit Number 8    Authorization - Number of Visits 24    Progress Note Due on Visit 10    PT Start Time 1515    PT Stop Time 1600    PT Time Calculation (min) 45 min    Activity Tolerance Patient tolerated treatment well    Behavior During Therapy [facility] for tasks assessed/performed              Past Medical History:  Diagnosis Date   ADD (attention deficit disorder)    Alcohol abuse    Anemia    Anxiety    Aortic atherosclerosis 02/20/2018   Chest CT Sept 2019   Arthritis    rheumatoid arthritis   Asthma    Bipolar disorder (HCC)    Centrilobular emphysema (HCC)    Chronic diarrhea 07/22/2022   Cocaine use disorder, moderate, in sustained remission (HCC)    Constipation    Degenerative disc disease at L5-S1 level 09/28/2016   See ortho note May 2018   Depression    bipolar, hx of suicide attempt   Diabetes mellitus, type 2 (HCC)    Diverticulitis 2013   DOE (dyspnea on exertion)    Drug use    Family history of adverse reaction to anesthesia    mom-delayed emergence   GERD (gastroesophageal reflux disease)    H/O suicide attempt    slit wrists   Hepatitis C 06/26/2014   treated with Harvoni   Hip pain    History of echocardiogram    a. 04/2019 Echo: EF >65%, nl RV fxn.   History of MRSA infection 2013   HLD (hyperlipidemia)    Hypertension    a. 06/2021 Renal Duplex: ? bilat RAS; b. 06/2021 CTA Abd: No signif RAS.   Hyponatremia    Hypothyroidism    Incisional hernia 11/08/2012   Knee pain    Left carotid bruit    Morbid obesity (HCC)    Multinodular thyroid     OSA (obstructive sleep  apnea)    OSA on CPAP    Osteoporosis    Palpitations    Post-traumatic osteoarthritis of right knee 09/24/2015   Recurrent ventral hernia 11/08/2012   Skin lesion 06/28/2022   Status post total hip replacement, left 07/23/2019   Status post total right knee replacement using cement 05/10/2016   Stroke (HCC) 07/05/2022   right arm,torso and right leg numbness   Tobacco use disorder    Tobacco use disorder, continuous 06/26/2014   Vitamin B12 deficiency    Vitamin D  deficiency 04/09/2015   Past Surgical History:  Procedure Laterality Date   BILATERAL SALPINGOOPHORECTOMY     due to abnormal mass   BREAST BIOPSY Left    neg   BREAST SURGERY Left 20 yrs ago   CESAREAN SECTION     COLONOSCOPY     COLONOSCOPY WITH PROPOFOL  N/A 10/16/2017   Procedure: COLONOSCOPY WITH PROPOFOL ;  Surgeon: Therisa Bi, MD;  Location: [facility] ENDOSCOPY;  Service: Gastroenterology;  Laterality: N/A;   COLONOSCOPY WITH PROPOFOL  N/A 02/16/2021   Procedure: COLONOSCOPY WITH PROPOFOL ;  Surgeon:  Therisa Bi, MD;  Location: [facility] ENDOSCOPY;  Service: Gastroenterology;  Laterality: N/A;   FORAMINOTOMY 1 LEVEL Right 10/10/2022   Procedure: RIGHT L4-5 LAMINOFORAMINOTOMY;  Surgeon: Clois Fret, MD;  Location: [facility] ORS;  Service: Neurosurgery;  Laterality: Right;   HERNIA REPAIR  06/2011, July 2014   Ventral wall repair with Physiomesh   HERNIA REPAIR     2nd.vental wall repair   JOINT REPLACEMENT Right    knee   LUMBAR LAMINECTOMY/ DECOMPRESSION WITH MET-RX N/A 10/12/2022   Procedure: LUMBAR LAMINECTOMY/ DECOMPRESSION WITH MET-RX;  Surgeon: Clois Fret, MD;  Location: [facility] ORS;  Service: Neurosurgery;  Laterality: N/A;   TONSILLECTOMY     TOTAL HIP ARTHROPLASTY Left 07/23/2019   Procedure: TOTAL HIP ARTHROPLASTY;  Surgeon: Edie Norleen PARAS, MD;  Location: [facility] ORS;  Service: Orthopedics;  Laterality: Left;   TOTAL KNEE ARTHROPLASTY Right 05/10/2016   Procedure: TOTAL KNEE ARTHROPLASTY;  Surgeon: Norleen PARAS Edie,  MD;  Location: [facility] ORS;  Service: Orthopedics;  Laterality: Right;   TUBAL LIGATION     Patient Active Problem List   Diagnosis Date Noted   Urge incontinence 12/22/2023   Painful and cold lower extremity 12/22/2023   Hemiplegia and hemiparesis following cerebral infarction affecting right dominant side (HCC) 12/04/2023   CKD (chronic kidney disease) stage 3, GFR 30-59 ml/min (HCC) 12/04/2023   Dysuria 11/03/2023   History of stroke 04/17/2023   Right leg weakness 04/17/2023   Neurogenic bladder 02/13/2023   Abnormality of gait and mobility 02/13/2023   Morbid obesity with body mass index (BMI) of 40.0 to 44.9 in adult (HCC) 01/10/2023   Left thyroid  nodule 01/10/2023   Bipolar 1 disorder, depressed, moderate (HCC) 11/29/2022   Normocytic normochromic anemia 10/13/2022   Epidural hematoma (HCC) 10/12/2022   Mass in epidural space 10/12/2022   Synovial cyst of lumbar facet joint 10/10/2022   Lumbar radiculopathy 10/10/2022   S/P laminectomy 10/10/2022   Epigastric pain 07/22/2022   Enlarged thyroid  07/22/2022   CVA (cerebrovascular accident) (HCC) 07/05/2022   Type II diabetes mellitus with nephropathy (HCC) 06/16/2021   H/O thyroid  nodule 05/31/2021   History of hepatitis C- treated 2014- 2015 with [facility] gastroenterology and followed.  05/19/2021   Left carotid bruit 04/28/2021   Bipolar disorder, in full remission, most recent episode depressed 02/05/2021   Anxiety 05/28/2020   Bipolar affective disorder in remission 05/28/2020   Bipolar disorder, in full remission, most recent episode mixed 03/06/2020   Need for immunization against influenza 01/27/2020   Morbid obesity (HCC) 01/27/2020   Osteoporosis 11/06/2019   Bipolar 1 disorder, mixed, mild (HCC) 10/29/2019   PTSD (post-traumatic stress disorder) 09/27/2019   Bereavement 09/27/2019   Cocaine use disorder, moderate, in sustained remission (HCC) 09/27/2019   Primary osteoarthritis of left hip 06/14/2019   Allergic  rhinitis 08/23/2018   Aortic atherosclerosis 02/20/2018   Centrilobular emphysema (HCC) 02/20/2018   Hyperlipidemia 03/28/2017   Degenerative disc disease at L5-S1 level 09/28/2016   GERD (gastroesophageal reflux disease) 12/17/2015   Chronic back pain 10/09/2015   Post-traumatic osteoarthritis of right knee 09/24/2015   Alcohol use disorder, severe, in sustained remission (HCC) 04/13/2015   OSA on CPAP 04/09/2015   Subclinical hyperthyroidism 06/26/2014   Goiter colloid, toxic, nodular 06/26/2014   Hypertension 06/26/2014   Bipolar I disorder (HCC) 06/26/2014   Lumbar radiculitis 10/03/2013   Diverticulosis 08/24/2013    PCP: Dr. Luke Shade     REFERRING PROVIDER: Dr. Fret Clois    REFERRING DIAG: M54.50,G89.29 (ICD-10-CM) -  Chronic bilateral low back pain without sciatica  Rationale for Evaluation and Treatment: Rehabilitation  THERAPY DIAG:  Other low back pain  Imbalance  Difficulty in walking, not elsewhere classified  ONSET DATE: 10/2022  SUBJECTIVE:                                                                                                                                                                                           SUBJECTIVE STATEMENT:   Pt states feeling increased back pain at start of session.    PERTINENT HISTORY:  She has prior history of left sided stroke affecting her right side in February 2024. She also had a L3-L4 lumbar decompression surgery in June 2024 with complications due to a epidural hematoma that resulted in cauda equinda syndrome and her needing to return to ER for evacuation and she was in the hospital for an additional 8 days following surgery.  She is just moving back to [place] from [place], because she has qualified for housing in [place]. She describes being most concerned with her balance especially when walking over uneven surfaces. She mainly uses a a 2WW when walking to places that she has to drive to  because it is easier to transport. She has not had a fall in the past 6 mo, but she is scared of falling especially when walking over uneven surfaces. She does report an improvement in LE function with her now being able to raise her toes and that she was not able to do this after surgery.   PAIN:  Are you having pain? Yes: NPRS scale: 6/10 NRPS on central spinous process of L1-L5    Pain location: L1-L5 Central Spinous   Pain description: Dull   Aggravating factors: Tends to hurt more as the day goes on or the more active she is. Relieving factors: 5 x 325 Viacodin, Alieve  as needed     PRECAUTIONS: Fall; she is afraid of falling    RED FLAGS: None   WEIGHT BEARING RESTRICTIONS: No  FALLS:  Has patient fallen in last 6 months? No  LIVING ENVIRONMENT: Lives with: lives alone Lives in: House/apartment Stairs: Yes: External: 4 steps; on right going up and there is a ramp going to a house and there is a ramped main entrance leading to lobby. Place is called [employer]   Has following equipment at home: Single point cane, Environmental consultant - 2 wheeled, Environmental consultant - 4 wheeled, shower chair, Grab bars, and Ramped entry  OCCUPATION: Retired   PLOF: Independent  PATIENT GOALS: Pt wants to be able to walk outside using single point cane and to feel more confidence  when walking especially over uneven surfaces. She currently uses rollator to walk dog at home and 2WW when walking anywhere where she has to drive.   NEXT MD VISIT: October 2025     OBJECTIVE:  Note: Objective measures were completed at Evaluation unless otherwise noted.  VITALS BP 118/66 HR  60 Sp02  97%  DIAGNOSTIC FINDINGS:  CLINICAL DATA:  Low back pain. Cauda equina syndrome suspected. Cyst removal from the spine two days ago. Postoperative urinary incontinence and bilateral leg numbness.   EXAM: MRI LUMBAR SPINE WITHOUT CONTRAST   TECHNIQUE: Multiplanar, multisequence MR imaging of the lumbar spine was performed. No  intravenous contrast was administered.   COMPARISON:  05/18/2022   FINDINGS: Segmentation: 5 lumbar type vertebral bodies as numbered previously.   Alignment: Scoliotic curvature convex to the right as seen previously. No significant listhesis.   Vertebrae: Edematous change of the endplates at T10, U88 and T12, likely to relate to regional pain. This is probably degenerative. Lesser vertebral endplate edema noted at L3-4 and L4-5.   Conus medullaris and cauda equina: Conus extends to the L1 level. Conus and cauda equina appear normal.   Paraspinal and other soft tissues: Posterior surgical approach at the L4-5 level from the right-side has an expected appearance.   Disc levels:   At the surgical level and extending within the spinal canal from the upper L3 level to the L 4 5 disc level, there is a fluid collection which causes marked compression of the thecal sac. This is most severe at the level of the L3-4 disc where the fluid collection within the canal measures 1.8 x 1.5 cm in diameter and markedly flattens the thecal sac anteriorly.   Other than the surgical decompression of the synovial cyst on the right and the postoperative intraspinal fluid collection, there is no change since the preoperative exam, as one might expect.   IMPRESSION: 1. At the surgical level and extending from the upper L3 level to the L4-5 disc level, there is a postoperative fluid collection within the spinal canal which causes marked compression of the thecal sac anteriorly. This is most severe at the level of the L3-4 disc where the fluid collection within the canal measures 1.8 x 1.5 cm in diameter and markedly flattens the thecal sac anteriorly.     Electronically Signed   By: Oneil Officer M.D.   On: 10/12/2022 16:20  PATIENT SURVEYS:  ABC scale: The Activities-Specific Balance Confidence (ABC) Scale 0% 10 20 30  40 50 60 70 80 90 100% No confidence<->completely confident  "How  confident are you that you will not lose your balance or become unsteady when you . . .   Date tested 01/01/24  Walk around the house 80%  2. Walk up or down stairs 80%  3. Bend over and pick up a slipper from in front of a closet floor 60%  4. Reach for a small can off a shelf at eye level 90%  5. Stand on tip toes and reach for something above your head 0%  6. Stand on a chair and reach for something 40%  7. Sweep the floor 50%  8. Walk outside the house to a car parked in the driveway 90%  9. Get into or out of a car 80%  10. Walk across a parking lot to the mall 80%  11. Walk up or down a ramp 80%  12. Walk in a crowded mall where people rapidly walk past you 60%  13.  Are bumped into by people as you walk through the mall 40%  14. Step onto or off of an escalator while you are holding onto the railing 50%  15. Step onto or off an escalator while holding onto parcels such that you cannot hold onto the railing 30%  16. Walk outside on icy sidewalks 0%  Total: #/16  57% (910/1600)       COGNITION: Overall cognitive status: Within functional limits for tasks assessed     SENSATION: Light touch: Impaired electricity down her RLE   MUSCLE LENGTH: Hamstrings: Not tested   Debby test: Not tested    POSTURE: rounded shoulders  PALPATION: Not performed    LUMBAR ROM:   AROM eval  Flexion 80%  Extension 100%  Right lateral flexion 100%  Left lateral flexion 100%  Right rotation 100%  Left rotation 100%   (Blank rows = not tested)  LOWER EXTREMITY ROM:     Active  Right eval Left eval  Hip flexion    Hip extension    Hip abduction    Hip adduction    Hip internal rotation    Hip external rotation    Knee flexion    Knee extension    Ankle dorsiflexion    Ankle plantarflexion    Ankle inversion    Ankle eversion     (Blank rows = not tested)  LOWER EXTREMITY MMT:    MMT Right eval Left eval  Hip flexion 4- 4  Hip extension    Hip abduction    Hip  adduction    Hip internal rotation    Hip external rotation    Knee flexion 4 4  Knee extension 4 4  Ankle dorsiflexion 4 4  Ankle plantarflexion    Ankle inversion    Ankle eversion     (Blank rows = not tested)    FUNCTIONAL TESTS:  5 times sit to stand: 14 sec  30 seconds chair stand test: 11 reps   Timed up and go (TUG): 15.38 sec with use of single point cane   6 minute walk test: 553 ft  10 meter walk test: 0.61 m/sec                 DGI:  13/26                       <=19/24  predictive of falls in the elderly     GAIT: Distance walked: 30 ft    Assistive device utilized: Walker - 2 wheeled Level of assistance: Modified independence Comments: Significant use of BUE support with decrease step length bilaterally.    TREATMENT DATE:   02/12/24: Physical Performance   Gait Speed :  23 ft ( ) , 9.6 sec (0.73 m/sec) TUG: 14.47 sec with use of 2WW    NMR  Short Arc  RLE Short Arch Quads 1 x 10  -Pt continues to externally rotate hip   LLE Short Arch Quads  1 x 10  -Pt able to perform with toes pointed straight   RLE Quad Sets with 5 sec hold 2 x 10     THERAC  300 ft overground ambulation with 2WW down hill outside    -2X standing rest break   Standing foot taps on RLE with BUE support using 2WW 1 x 10  -Pt reports increased activation of  adductor on RLE along with external rotation of right foot.     PATIENT EDUCATION:  Education details: Form and technique for correct performance of exercise and explanation about cut-offs for functional tests.  Person educated: Patient Education method: Explanation, Demonstration, Verbal cues, and Handouts Education comprehension: verbalized understanding, returned demonstration, and verbal cues required  HOME EXERCISE PROGRAM: Access Code: [email or web address] URL: [email or web address] Date: 02/05/2024 Prepared by: Toribio Servant  Exercises - Seated Hamstring Stretch  - 1 x daily - 7 x weekly - 2-3 reps -  60 sec hold - Seated Hamstring Stretch (Mirrored)  - 1 x daily - 7 x weekly - 2-3 reps - 60 sec hold - Alternating Step Taps with Counter Support (Mirrored)  - 5-7 x weekly - 3 sets - 20 reps - Standing Hip Abduction with Counter Support  - 2-3 x daily - 5-7 x weekly - 3 sets - 10 reps - Standing Hip Extension with Counter Support  - 2-3 x daily - 5-7 x weekly - 3 sets - 10 reps - Sit to Stand  - 3-4 x weekly - 3 sets - 10 reps - Standing Ankle Dorsiflexion with Table Support  - 5-7 x weekly - 3 sets - 20 reps - Seated Slump Neural Tensioner  - 1 x daily - 7 x weekly - 1 sets - 10 reps  ASSESSMENT:  CLINICAL IMPRESSION:   Pt shows ongoing progress towards goals with improvement in mobility. She continues to respond positively to use of TENS to decreased with ability to perform activity like walking and standing exercises with decreased pain. She does exhibit decreased right quad activation with compensation with ankle external rotation and use of adductors.  She will continue to benefit from skilled PT to improve motor control and strength of lower extremities to improve dynamic balance and gait stability to avoid falling and to decrease level of assistive device to return to her prior level of function.      OBJECTIVE IMPAIRMENTS: Abnormal gait, decreased endurance, decreased ROM, decreased strength, impaired flexibility, impaired sensation, obesity, and pain.   ACTIVITY LIMITATIONS: carrying, lifting, bending, standing, squatting, stairs, dressing, hygiene/grooming, and locomotion level  PARTICIPATION LIMITATIONS: meal prep, cleaning, laundry, shopping, community activity, and yard work  PERSONAL FACTORS: Fitness, Past/current experiences, Time since onset of injury/illness/exacerbation, and 3+ comorbidities: h/o stroke with right sided deficits, L3-L5 decompression, HLD, HTN are also affecting patient's functional outcome.   REHAB POTENTIAL: Fair multiple co-morbidities and chronicity of  condition  CLINICAL DECISION MAKING: Evolving/moderate complexity  EVALUATION COMPLEXITY: Moderate   GOALS: Goals reviewed with patient? No  SHORT TERM GOALS: Target date: 01/15/2024  Patient will demonstrate undestanding of home exercise plan by performing exercises correctly with evidence of good carry over with min to no verbal or tactile cues .   Baseline: NT  01/16/24: Performing Independently  Goal status: ACHIEVED     2.   Pt will increase by at least 74m (172ft) in order to demonstrate clinically significant improvement in cardiopulmonary endurance and community ambulation Baseline: 8/28: 553' with RW and unable to complete 6 minutes (stopped at 5 minutes)   Goal status: NOT MET     3.  Patient will show decreased risk of falling as evidence by decrease in self-selected gait speed by >= 0.12 m/sec.  Baseline: 8/28: 0.61 m/s Goal status: NOT MET    4.  Pt will decrease 5TSTS by at least 3 seconds to demonstrate a minimal clinically significant improvement in LE strength.  Baseline: 14 seconds  Goal status: NOT MET     LONG TERM GOALS: Target date: 03/25/2024  Patient will improve activities specific balance scale score >=67% as evidence of an improvement self-perception of function and to decrease risk for falls Mae et al., 2022).  Baseline: 57% Goal status: ONGOING    2.  Patient will perform 5 x STS in <11.4 sec to demonstrate improve LE strength to decrease risk of falling (Bohannon, 2006). Baseline: 14 sec   02/05/24:  14 sec  Goal status: ONGOING    3.  Patient will complete >=12 reps on 30 sec chair stands to exhibit improved LE endurance to decrease risk of falling.  Baseline: 11 reps   02/05/24: 11 reps    Goal status: ONGOING    4.  Patient will ambulate >=1000 ft in to show community ambulator status to be able to have aerobic endurance to return to fulfilling walking tasks at his job.  Baseline: 8/28: 553' with RW and unable to complete 6 minutes  (stopped at 5 minutes)  02/05/24: 558 ft with 2WW   Goal status: ONGOING    5.  Patient will demonstrate reduced falls risk as evidenced by Dynamic Gait Index (DGI) >19/24 to decrease risk of falling.  Baseline: 13/24 with use of single point cane in RUE    Goal status: ONGOING     6.  Patient will perform TUG in <13.5 sec as evidence of improved mobility and decreased risk for falling [facility] et al., 2000) Baseline:  8/28: 15.38 seconds with RW   02/12/24:  14.47 sec with 2 WW   Goal status: ONGOING    7. Patient will be able to ambulate safely with the least restrictive assistive device outside as  evidenced by decreased lateral sway, consistent stride length and step length, and with knowledge of when to take a break due to fatigue.  Baseline: Using 2WW or 4WW Goal status: ONGOING     8. Patient will perform in >=1.0 m./sec as evidence of improved mobility, decreased falls risk, and overall functional independence.  Baseline: 8/28: 0.61 m/s  02/12/24: 0.73 m/sec    Goal status: ONGOING   PLAN:  PT FREQUENCY: 1-2x/week  PT DURATION: 12 weeks  PLANNED INTERVENTIONS: 97164- PT Re-evaluation, 97750- Physical Performance Testing, 97110-Therapeutic exercises, 97530- Therapeutic activity, 97112- Neuromuscular re-education, 97535- Self Care, 02859- Manual therapy, [phone]- Gait training, [phone]- Orthotic Initial, [phone]- Orthotic/Prosthetic subsequent, [phone]- Canalith repositioning, [phone]- Aquatic Therapy, [phone]- Electrical stimulation (unattended), [phone]- Electrical stimulation (manual), [id number]- Ultrasound, 79439 (1-2 muscles), [id number] (3+ muscles)- Dry Needling, Patient/Family education, Balance training, Stair training, Taping, Joint mobilization, Joint manipulation, Spinal manipulation, Spinal mobilization, Scar mobilization, Vestibular training, DME instructions, Cryotherapy, Moist heat, and Biofeedback.  PLAN FOR NEXT SESSION: Reassess goals: Dynamic Gait Index and ABC scale. NMR-NMES  for improved left quad activation while performing step taps and straight leg raise.  Circuit training using TENS: attempt single point cane to walk with and mix in mini-squats and standing hip abduction during resistance portion of circuits.    Toribio Servant PT, DPT  [facility] [address], [place], [address] Phone: [phone]   Fax:  [phone]

## 2024-02-13 ENCOUNTER — Other Ambulatory Visit: Payer: Self-pay

## 2024-02-13 NOTE — Progress Notes (Signed)
 Assessment & Plan:  1. Diastolic dysfunction (Primary) - Ambulatory referral to Cardiology  2. SOB (shortness of breath) - Ambulatory referral to Cardiology   Patient Instructions  1.  We will refer you to cardiology for follow-up since you wish to switch.  Will be [facility] : [facility].  2.  Continue Anoro for now.  3.  We will call you with the results of your breathing test.  Please allow at least 1 week after the test is done for results.  4.  Continue your efforts to quit smoking.  5.  We will see him in follow-up in 3 months time call sooner should any new difficulties arise.    Please note: late entry documentation due to logistical difficulties during COVID-19 pandemic. This note is filed for information purposes only, and is not intended to be used for billing, nor does it represent the full scope/nature of the visit in question. Please see any associated scanned media linked to date of encounter for additional pertinent information.  Subjective:    HPI: Susan [name] is a 63 y.o. female presenting to the pulmonology clinic on 02/06/2019 with report of: Follow-up (pt states breathing has improved. occ sob with exertion and mild wheezing. )     Outpatient Encounter Medications as of 02/06/2019  Medication Sig   [DISCONTINUED] albuterol  (PROVENTIL  HFA;VENTOLIN  HFA) 108 (90 Base) MCG/ACT inhaler INHALE 1 TO 2 PUFFS EVERY 4 HOURS AS NEEDED FOR COUGH, WHEEZING, AND SHORTNESS OF BREATH   [DISCONTINUED] amLODipine  (NORVASC ) 5 MG tablet TAKE 1 TABLET (5 MG TOTAL) BY MOUTH DAILY.   [DISCONTINUED] atenolol  (TENORMIN ) 25 MG tablet TAKE 1 TABLET EVERY DAY   [DISCONTINUED] atorvastatin  (LIPITOR) 10 MG tablet TAKE 1 TABLET (10 MG TOTAL) BY MOUTH AT BEDTIME.   [DISCONTINUED] buPROPion  (WELLBUTRIN  SR) 200 MG 12 hr tablet Take 1 tablet (200 mg total) by mouth daily. (Patient not taking: Reported on 04/08/2019)   [DISCONTINUED] clotrimazole  (LOTRIMIN ) 1 % cream Apply 1  application topically 2 (two) times daily.   [DISCONTINUED] cyclobenzaprine  (FLEXERIL ) 5 MG tablet Take 10 mg by mouth at bedtime.   [DISCONTINUED] DULoxetine  (CYMBALTA ) 60 MG capsule TAKE 1 CAPSULE (60 MG TOTAL) BY MOUTH 2 (TWO) TIMES DAILY.   [DISCONTINUED] gabapentin  (NEURONTIN ) 300 MG capsule Take 600 mg by mouth 2 (two) times daily.    [DISCONTINUED] HYDROcodone -acetaminophen  (NORCO/VICODIN) 5-325 MG tablet Take 0.5-1 tablets by mouth every 6 (six) hours as needed for moderate pain.    [DISCONTINUED] Insulin  Pen Needle 32G X 4 MM MISC Use 1 needle daily to inject [name] .   [DISCONTINUED] liraglutide  ([name] ) 18 MG/3ML SOPN Inject 0.3 mLs (1.8 mg total) into the skin daily.   [DISCONTINUED] loratadine  (CLARITIN ) 10 MG tablet Take 1 tablet (10 mg total) by mouth daily as needed for allergies. (Patient taking differently: Take 10 mg by mouth daily.)   [DISCONTINUED] losartan  (COZAAR ) 100 MG tablet TAKE 1 TABLET EVERY DAY   [DISCONTINUED] omeprazole  (PRILOSEC) 40 MG capsule Take 1 capsule (40 mg total) by mouth daily.   [DISCONTINUED] topiramate  (TOPAMAX ) 25 MG tablet Take 1 tablet (25 mg total) by mouth daily.   [DISCONTINUED] umeclidinium-vilanterol (ANORO ELLIPTA ) 62.5-25 MCG/INH AEPB Inhale 1 puff into the lungs daily.   [DISCONTINUED] vitamin B-12 (CYANOCOBALAMIN ) 1000 MCG tablet Take 1,000 mcg by mouth daily.  (Patient not taking: Reported on 04/13/2021)   [DISCONTINUED] Vitamin D , Ergocalciferol , (DRISDOL ) 1.25 MG (50000 UT) CAPS capsule Take 1 capsule (50,000 Units total) by mouth every 3 (three) days.   [  DISCONTINUED] predniSONE  (STERAPRED UNI-PAK 48 TAB) 10 MG (48) TBPK tablet    No facility-administered encounter medications on file as of 02/06/2019.      Objective:   Vitals:   02/06/19 0831  BP: 122/78  Pulse: 89  Temp: 98.1 F (36.7 C)  Height: 5' 3 (1.6 m)  Weight: 227 lb 6.4 oz (103.1 kg)  SpO2: 97%  TempSrc: Temporal  BMI (Calculated): 40.29     Physical exam  documentation is limited by delayed entry of information.

## 2024-02-14 ENCOUNTER — Other Ambulatory Visit: Payer: Self-pay

## 2024-02-14 ENCOUNTER — Ambulatory Visit: Admitting: Physical Therapy

## 2024-02-14 DIAGNOSIS — M5459 Other low back pain: Secondary | ICD-10-CM

## 2024-02-14 DIAGNOSIS — R2689 Other abnormalities of gait and mobility: Secondary | ICD-10-CM

## 2024-02-14 NOTE — Therapy (Signed)
 OUTPATIENT PHYSICAL THERAPY THORACOLUMBAR AND BALANCE  TREATMENT   Patient Name: Susan [name] MRN: [id number] DOB:07-10-1960, 63 y.o., female Today's Date: 02/14/2024  END OF SESSION:  PT End of Session - 02/14/24 1701     Visit Number 9    Number of Visits 24    Date for Recertification  03/25/24    Authorization Type [facility] Medicare Dual 2025    Authorization - Visit Number 9    Authorization - Number of Visits 24    Progress Note Due on Visit 10    PT Start Time 1515    PT Stop Time 1600    PT Time Calculation (min) 45 min    Activity Tolerance Patient tolerated treatment well    Behavior During Therapy [name] for tasks assessed/performed               Past Medical History:  Diagnosis Date   ADD (attention deficit disorder)    Alcohol abuse    Anemia    Anxiety    Aortic atherosclerosis 02/20/2018   Chest CT Sept 2019   Arthritis    rheumatoid arthritis   Asthma    Bipolar disorder (HCC)    Centrilobular emphysema (HCC)    Chronic diarrhea 07/22/2022   Cocaine use disorder, moderate, in sustained remission (HCC)    Constipation    Degenerative disc disease at L5-S1 level 09/28/2016   See ortho note May 2018   Depression    bipolar, hx of suicide attempt   Diabetes mellitus, type 2 (HCC)    Diverticulitis 2013   DOE (dyspnea on exertion)    Drug use    Family history of adverse reaction to anesthesia    mom-delayed emergence   GERD (gastroesophageal reflux disease)    H/O suicide attempt    slit wrists   Hepatitis C 06/26/2014   treated with Harvoni   Hip pain    History of echocardiogram    a. 04/2019 Echo: EF >65%, nl RV fxn.   History of MRSA infection 2013   HLD (hyperlipidemia)    Hypertension    a. 06/2021 Renal Duplex: ? bilat RAS; b. 06/2021 CTA Abd: No signif RAS.   Hyponatremia    Hypothyroidism    Incisional hernia 11/08/2012   Knee pain    Left carotid bruit    Morbid obesity (HCC)    Multinodular thyroid     OSA (obstructive sleep  apnea)    OSA on CPAP    Osteoporosis    Palpitations    Post-traumatic osteoarthritis of right knee 09/24/2015   Recurrent ventral hernia 11/08/2012   Skin lesion 06/28/2022   Status post total hip replacement, left 07/23/2019   Status post total right knee replacement using cement 05/10/2016   Stroke (HCC) 07/05/2022   right arm,torso and right leg numbness   Tobacco use disorder    Tobacco use disorder, continuous 06/26/2014   Vitamin B12 deficiency    Vitamin D  deficiency 04/09/2015   Past Surgical History:  Procedure Laterality Date   BILATERAL SALPINGOOPHORECTOMY     due to abnormal mass   BREAST BIOPSY Left    neg   BREAST SURGERY Left 20 yrs ago   CESAREAN SECTION     COLONOSCOPY     COLONOSCOPY WITH PROPOFOL  N/A 10/16/2017   Procedure: COLONOSCOPY WITH PROPOFOL ;  Surgeon: Therisa Bi, MD;  Location: [facility] ENDOSCOPY;  Service: Gastroenterology;  Laterality: N/A;   COLONOSCOPY WITH PROPOFOL  N/A 02/16/2021   Procedure: COLONOSCOPY WITH PROPOFOL ;  Surgeon: Therisa Bi, MD;  Location: [facility] ENDOSCOPY;  Service: Gastroenterology;  Laterality: N/A;   FORAMINOTOMY 1 LEVEL Right 10/10/2022   Procedure: RIGHT L4-5 LAMINOFORAMINOTOMY;  Surgeon: Clois Fret, MD;  Location: [facility] ORS;  Service: Neurosurgery;  Laterality: Right;   HERNIA REPAIR  06/2011, July 2014   Ventral wall repair with Physiomesh   HERNIA REPAIR     2nd.vental wall repair   JOINT REPLACEMENT Right    knee   LUMBAR LAMINECTOMY/ DECOMPRESSION WITH MET-RX N/A 10/12/2022   Procedure: LUMBAR LAMINECTOMY/ DECOMPRESSION WITH MET-RX;  Surgeon: Clois Fret, MD;  Location: [facility] ORS;  Service: Neurosurgery;  Laterality: N/A;   TONSILLECTOMY     TOTAL HIP ARTHROPLASTY Left 07/23/2019   Procedure: TOTAL HIP ARTHROPLASTY;  Surgeon: Edie Norleen PARAS, MD;  Location: [facility] ORS;  Service: Orthopedics;  Laterality: Left;   TOTAL KNEE ARTHROPLASTY Right 05/10/2016   Procedure: TOTAL KNEE ARTHROPLASTY;  Surgeon: Norleen PARAS Edie,  MD;  Location: [facility] ORS;  Service: Orthopedics;  Laterality: Right;   TUBAL LIGATION     Patient Active Problem List   Diagnosis Date Noted   Urge incontinence 12/22/2023   Painful and cold lower extremity 12/22/2023   Hemiplegia and hemiparesis following cerebral infarction affecting right dominant side (HCC) 12/04/2023   CKD (chronic kidney disease) stage 3, GFR 30-59 ml/min (HCC) 12/04/2023   Dysuria 11/03/2023   History of stroke 04/17/2023   Right leg weakness 04/17/2023   Neurogenic bladder 02/13/2023   Abnormality of gait and mobility 02/13/2023   Morbid obesity with body mass index (BMI) of 40.0 to 44.9 in adult (HCC) 01/10/2023   Left thyroid  nodule 01/10/2023   Bipolar 1 disorder, depressed, moderate (HCC) 11/29/2022   Normocytic normochromic anemia 10/13/2022   Epidural hematoma (HCC) 10/12/2022   Mass in epidural space 10/12/2022   Synovial cyst of lumbar facet joint 10/10/2022   Lumbar radiculopathy 10/10/2022   S/P laminectomy 10/10/2022   Epigastric pain 07/22/2022   Enlarged thyroid  07/22/2022   CVA (cerebrovascular accident) (HCC) 07/05/2022   Type II diabetes mellitus with nephropathy (HCC) 06/16/2021   H/O thyroid  nodule 05/31/2021   History of hepatitis C- treated 2014- 2015 with [facility] gastroenterology and followed.  05/19/2021   Left carotid bruit 04/28/2021   Bipolar disorder, in full remission, most recent episode depressed 02/05/2021   Anxiety 05/28/2020   Bipolar affective disorder in remission 05/28/2020   Bipolar disorder, in full remission, most recent episode mixed 03/06/2020   Need for immunization against influenza 01/27/2020   Morbid obesity (HCC) 01/27/2020   Osteoporosis 11/06/2019   Bipolar 1 disorder, mixed, mild (HCC) 10/29/2019   PTSD (post-traumatic stress disorder) 09/27/2019   Bereavement 09/27/2019   Cocaine use disorder, moderate, in sustained remission (HCC) 09/27/2019   Primary osteoarthritis of left hip 06/14/2019   Allergic  rhinitis 08/23/2018   Aortic atherosclerosis 02/20/2018   Centrilobular emphysema (HCC) 02/20/2018   Hyperlipidemia 03/28/2017   Degenerative disc disease at L5-S1 level 09/28/2016   GERD (gastroesophageal reflux disease) 12/17/2015   Chronic back pain 10/09/2015   Post-traumatic osteoarthritis of right knee 09/24/2015   Alcohol use disorder, severe, in sustained remission (HCC) 04/13/2015   OSA on CPAP 04/09/2015   Subclinical hyperthyroidism 06/26/2014   Goiter colloid, toxic, nodular 06/26/2014   Hypertension 06/26/2014   Bipolar I disorder (HCC) 06/26/2014   Lumbar radiculitis 10/03/2013   Diverticulosis 08/24/2013    PCP: Dr. Luke Shade     REFERRING PROVIDER: Dr. Fret Clois    REFERRING DIAG: M54.50,G89.29 (ICD-10-CM) -  Chronic bilateral low back pain without sciatica  Rationale for Evaluation and Treatment: Rehabilitation  THERAPY DIAG:  No diagnosis found.  ONSET DATE: 10/2022  SUBJECTIVE:                                                                                                                                                                                           SUBJECTIVE STATEMENT:   Pt states ongoing low back pain that is not any better or different than her normal feeling.    PERTINENT HISTORY:  She has prior history of left sided stroke affecting her right side in February 2024. She also had a L3-L4 lumbar decompression surgery in June 2024 with complications due to a epidural hematoma that resulted in cauda equinda syndrome and her needing to return to ER for evacuation and she was in the hospital for an additional 8 days following surgery.  She is just moving back to [place] from [place], because she has qualified for housing in [place]. She describes being most concerned with her balance especially when walking over uneven surfaces. She mainly uses a a 2WW when walking to places that she has to drive to because it is easier to  transport. She has not had a fall in the past 6 mo, but she is scared of falling especially when walking over uneven surfaces. She does report an improvement in LE function with her now being able to raise her toes and that she was not able to do this after surgery.   PAIN:  Are you having pain? Yes: NPRS scale: 6/10 NRPS on central spinous process of L1-L5    Pain location: L1-L5 Central Spinous   Pain description: Dull   Aggravating factors: Tends to hurt more as the day goes on or the more active she is. Relieving factors: 5 x 325 Viacodin, Alieve  as needed     PRECAUTIONS: Fall; she is afraid of falling    RED FLAGS: None   WEIGHT BEARING RESTRICTIONS: No  FALLS:  Has patient fallen in last 6 months? No  LIVING ENVIRONMENT: Lives with: lives alone Lives in: House/apartment Stairs: Yes: External: 4 steps; on right going up and there is a ramp going to a house and there is a ramped main entrance leading to lobby. Place is called [employer]   Has following equipment at home: Single point cane, Environmental consultant - 2 wheeled, Environmental consultant - 4 wheeled, shower chair, Grab bars, and Ramped entry  OCCUPATION: Retired   PLOF: Independent  PATIENT GOALS: Pt wants to be able to walk outside using single point cane and to feel more confidence when walking especially  over uneven surfaces. She currently uses rollator to walk dog at home and 2WW when walking anywhere where she has to drive.   NEXT MD VISIT: October 2025     OBJECTIVE:  Note: Objective measures were completed at Evaluation unless otherwise noted.  VITALS BP 118/66 HR  60 Sp02  97%  DIAGNOSTIC FINDINGS:  CLINICAL DATA:  Low back pain. Cauda equina syndrome suspected. Cyst removal from the spine two days ago. Postoperative urinary incontinence and bilateral leg numbness.   EXAM: MRI LUMBAR SPINE WITHOUT CONTRAST   TECHNIQUE: Multiplanar, multisequence MR imaging of the lumbar spine was performed. No intravenous contrast was  administered.   COMPARISON:  05/18/2022   FINDINGS: Segmentation: 5 lumbar type vertebral bodies as numbered previously.   Alignment: Scoliotic curvature convex to the right as seen previously. No significant listhesis.   Vertebrae: Edematous change of the endplates at T10, U88 and T12, likely to relate to regional pain. This is probably degenerative. Lesser vertebral endplate edema noted at L3-4 and L4-5.   Conus medullaris and cauda equina: Conus extends to the L1 level. Conus and cauda equina appear normal.   Paraspinal and other soft tissues: Posterior surgical approach at the L4-5 level from the right-side has an expected appearance.   Disc levels:   At the surgical level and extending within the spinal canal from the upper L3 level to the L 4 5 disc level, there is a fluid collection which causes marked compression of the thecal sac. This is most severe at the level of the L3-4 disc where the fluid collection within the canal measures 1.8 x 1.5 cm in diameter and markedly flattens the thecal sac anteriorly.   Other than the surgical decompression of the synovial cyst on the right and the postoperative intraspinal fluid collection, there is no change since the preoperative exam, as one might expect.   IMPRESSION: 1. At the surgical level and extending from the upper L3 level to the L4-5 disc level, there is a postoperative fluid collection within the spinal canal which causes marked compression of the thecal sac anteriorly. This is most severe at the level of the L3-4 disc where the fluid collection within the canal measures 1.8 x 1.5 cm in diameter and markedly flattens the thecal sac anteriorly.     Electronically Signed   By: Oneil Officer M.D.   On: 10/12/2022 16:20  PATIENT SURVEYS:  ABC scale: The Activities-Specific Balance Confidence (ABC) Scale 0% 10 20 30  40 50 60 70 80 90 100% No confidence<->completely confident  "How confident are you that you will  not lose your balance or become unsteady when you . . .   Date tested 01/01/24  Walk around the house 80%  2. Walk up or down stairs 80%  3. Bend over and pick up a slipper from in front of a closet floor 60%  4. Reach for a small can off a shelf at eye level 90%  5. Stand on tip toes and reach for something above your head 0%  6. Stand on a chair and reach for something 40%  7. Sweep the floor 50%  8. Walk outside the house to a car parked in the driveway 90%  9. Get into or out of a car 80%  10. Walk across a parking lot to the mall 80%  11. Walk up or down a ramp 80%  12. Walk in a crowded mall where people rapidly walk past you 60%  13. Are bumped into  by people as you walk through the mall 40%  14. Step onto or off of an escalator while you are holding onto the railing 50%  15. Step onto or off an escalator while holding onto parcels such that you cannot hold onto the railing 30%  16. Walk outside on icy sidewalks 0%  Total: #/16  57% (910/1600)       COGNITION: Overall cognitive status: Within functional limits for tasks assessed     SENSATION: Light touch: Impaired electricity down her RLE   MUSCLE LENGTH: Hamstrings: Not tested   Debby test: Not tested    POSTURE: rounded shoulders  PALPATION: Not performed    LUMBAR ROM:   AROM eval  Flexion 80%  Extension 100%  Right lateral flexion 100%  Left lateral flexion 100%  Right rotation 100%  Left rotation 100%   (Blank rows = not tested)  LOWER EXTREMITY ROM:     Active  Right eval Left eval  Hip flexion    Hip extension    Hip abduction    Hip adduction    Hip internal rotation    Hip external rotation    Knee flexion    Knee extension    Ankle dorsiflexion    Ankle plantarflexion    Ankle inversion    Ankle eversion     (Blank rows = not tested)  LOWER EXTREMITY MMT:    MMT Right eval Left eval  Hip flexion 4- 4  Hip extension    Hip abduction    Hip adduction    Hip internal rotation     Hip external rotation    Knee flexion 4 4  Knee extension 4 4  Ankle dorsiflexion 4 4  Ankle plantarflexion    Ankle inversion    Ankle eversion     (Blank rows = not tested)    FUNCTIONAL TESTS:  5 times sit to stand: 14 sec  30 seconds chair stand test: 11 reps   Timed up and go (TUG): 15.38 sec with use of single point cane   6 minute walk test: 553 ft  10 meter walk test: 0.61 m/sec                 DGI:  13/26                       <=19/24  predictive of falls in the elderly     GAIT: Distance walked: 30 ft    Assistive device utilized: Walker - 2 wheeled Level of assistance: Modified independence Comments: Significant use of BUE support with decrease step length bilaterally.    TREATMENT DATE:   02/14/24: NMR    [place] with setting mA CC 60 with 10/10 on/off with straight leg raise with focus on keeping toes pointed upward and not externally rotating 2 x 10   -2 channel pad placement  along anterior portion of quad    -Pt unable to keep ankle from externally rotating    Straight Leg Raise with single channel and 2 pad placement along lateral portion of the quad 1 x 10    -Pt shows decreased ankle external rotation  Supine Bent Knee Fall out on RLE 1 X 10   Left Side Lying Right Hip Abduction with knee flexed to 45 degrees 1 x 10   -Pt reports increased lateral quad muscle activation   Left Side Lying Right Hip Abduction with knee flexed to 45 degrees 1 x 10   -  Use of NMES with placement on lateral quad muscle bulk     ABC scale 33%   PATIENT EDUCATION:  Education details: Form and technique for correct performance of exercise and explanation about cut-offs for functional tests.  Person educated: Patient Education method: Explanation, Demonstration, Verbal cues, and Handouts Education comprehension: verbalized understanding, returned demonstration, and verbal cues required  HOME EXERCISE PROGRAM: Access Code: [email or web address] URL:  [email or web address] Date: 02/05/2024 Prepared by: Toribio Servant  Exercises - Seated Hamstring Stretch  - 1 x daily - 7 x weekly - 2-3 reps - 60 sec hold - Seated Hamstring Stretch (Mirrored)  - 1 x daily - 7 x weekly - 2-3 reps - 60 sec hold - Alternating Step Taps with Counter Support (Mirrored)  - 5-7 x weekly - 3 sets - 20 reps - Standing Hip Abduction with Counter Support  - 2-3 x daily - 5-7 x weekly - 3 sets - 10 reps - Standing Hip Extension with Counter Support  - 2-3 x daily - 5-7 x weekly - 3 sets - 10 reps - Sit to Stand  - 3-4 x weekly - 3 sets - 10 reps - Standing Ankle Dorsiflexion with Table Support  - 5-7 x weekly - 3 sets - 20 reps - Seated Slump Neural Tensioner  - 1 x daily - 7 x weekly - 1 sets - 10 reps  ASSESSMENT:  CLINICAL IMPRESSION:   Pt shows ongoing lateral quad strength deficits and potential myotomal deficits with weakness localized on lateral quad as evidenced by hip adductor and medial quad compensation with ankle external rotation with straight leg raise. She did show improved straight leg with lateral hip and quad musculature. Pt does show a decrease in balance confidence from her baseline score, and it is unclear why this is given improvement in her overall function. She will continue to benefit from skilled PT to improve motor control and strength of lower extremities to improve dynamic balance and gait stability to avoid falling and to decrease level of assistive device to return to her prior level of function.    OBJECTIVE IMPAIRMENTS: Abnormal gait, decreased endurance, decreased ROM, decreased strength, impaired flexibility, impaired sensation, obesity, and pain.   ACTIVITY LIMITATIONS: carrying, lifting, bending, standing, squatting, stairs, dressing, hygiene/grooming, and locomotion level  PARTICIPATION LIMITATIONS: meal prep, cleaning, laundry, shopping, community activity, and yard work  PERSONAL FACTORS: Fitness, Past/current  experiences, Time since onset of injury/illness/exacerbation, and 3+ comorbidities: h/o stroke with right sided deficits, L3-L5 decompression, HLD, HTN are also affecting patient's functional outcome.   REHAB POTENTIAL: Fair multiple co-morbidities and chronicity of condition  CLINICAL DECISION MAKING: Evolving/moderate complexity  EVALUATION COMPLEXITY: Moderate   GOALS: Goals reviewed with patient? No  SHORT TERM GOALS: Target date: 01/15/2024  Patient will demonstrate undestanding of home exercise plan by performing exercises correctly with evidence of good carry over with min to no verbal or tactile cues .   Baseline: NT  01/16/24: Performing Independently  Goal status: ACHIEVED     2.   Pt will increase by at least 44m (185ft) in order to demonstrate clinically significant improvement in cardiopulmonary endurance and community ambulation Baseline: 8/28: 553' with RW and unable to complete 6 minutes (stopped at 5 minutes)   Goal status: NOT MET     3.  Patient will show decreased risk of falling as evidence by decrease in self-selected gait speed by >= 0.12 m/sec.  Baseline: 8/28: 0.61 m/s Goal status: NOT MET    4.  Pt will decrease 5TSTS by at least 3 seconds to demonstrate a minimal clinically significant improvement in LE strength.  Baseline: 14 seconds  Goal status: NOT MET     LONG TERM GOALS: Target date: 03/25/2024  Patient will improve activities specific balance scale score >=67% as evidence of an improvement self-perception of function and to decrease risk for falls Mae et al., 2022).  Baseline: 57%  02/14/24: 33% Goal status: ONGOING    2.  Patient will perform 5 x STS in <11.4 sec to demonstrate improve LE strength to decrease risk of falling ([place]ohannon, 2006). Baseline: 14 sec   02/05/24:  14 sec  Goal status: ONGOING    3.  Patient will complete >=12 reps on 30 sec chair stands to exhibit improved LE endurance to decrease risk of falling.  Baseline: 11  reps   02/05/24: 11 reps    Goal status: ONGOING    4.  Patient will ambulate >=1000 ft in to show community ambulator status to be able to have aerobic endurance to return to fulfilling walking tasks at his job.  Baseline: 8/28: 553' with RW and unable to complete 6 minutes (stopped at 5 minutes)  02/05/24: 558 ft with 2WW   Goal status: ONGOING    5.  Patient will demonstrate reduced falls risk as evidenced by Dynamic Gait Index (DGI) >19/24 to decrease risk of falling.  Baseline: 13/24 with use of single point cane in RUE    Goal status: ONGOING     6.  Patient will perform TUG in <13.5 sec as evidence of improved mobility and decreased risk for falling [facility] et al., 2000) Baseline:  8/28: 15.38 seconds with RW   02/12/24:  14.47 sec with 2 WW   Goal status: ONGOING    7. Patient will be able to ambulate safely with the least restrictive assistive device outside as  evidenced by decreased lateral sway, consistent stride length and step length, and with knowledge of when to take a break due to fatigue.  Baseline: Using 2WW or 4WW Goal status: ONGOING     8. Patient will perform in >=1.0 m./sec as evidence of improved mobility, decreased falls risk, and overall functional independence.  Baseline: 8/28: 0.61 m/s  02/12/24: 0.73 m/sec    Goal status: ONGOING   PLAN:  PT FREQUENCY: 1-2x/week  PT DURATION: 12 weeks  PLANNED INTERVENTIONS: 97164- PT Re-evaluation, 97750- Physical Performance Testing, 97110-Therapeutic exercises, 97530- Therapeutic activity, 97112- Neuromuscular re-education, 97535- Self Care, 02859- Manual therapy, [phone]- Gait training, [phone]- Orthotic Initial, [phone]- Orthotic/Prosthetic subsequent, [phone]- Canalith repositioning, [phone]- Aquatic Therapy, [phone]- Electrical stimulation (unattended), [phone]- Electrical stimulation (manual), [id number]- Ultrasound, 79439 (1-2 muscles), 20561 (3+ muscles)- Dry Needling, Patient/Family education, Balance training, Stair  training, Taping, Joint mobilization, Joint manipulation, Spinal manipulation, Spinal mobilization, Scar mobilization, Vestibular training, DME instructions, Cryotherapy, Moist heat, and Biofeedback.  PLAN FOR NEXT SESSION: Reassess goals for progress note: Dynamic Gait Index. NMR-NMES for improved left quad activation while performing step taps and terminal knee extension.   Toribio Servant PT, DPT  [facility] [address], [place], [address] Phone: [phone]   Fax:  [phone]

## 2024-02-15 ENCOUNTER — Ambulatory Visit (HOSPITAL_BASED_OUTPATIENT_CLINIC_OR_DEPARTMENT_OTHER): Admitting: Student in an Organized Health Care Education/Training Program

## 2024-02-15 ENCOUNTER — Ambulatory Visit: Admitting: Neurosurgery

## 2024-02-15 ENCOUNTER — Ambulatory Visit
Admission: RE | Admit: 2024-02-15 | Discharge: 2024-02-15 | Disposition: A | Discharge status: Home / Self Care | Source: Ambulatory Visit | Attending: Student in an Organized Health Care Education/Training Program | Admitting: Student in an Organized Health Care Education/Training Program

## 2024-02-15 ENCOUNTER — Other Ambulatory Visit: Payer: Self-pay

## 2024-02-15 ENCOUNTER — Encounter: Payer: Self-pay | Admitting: Student in an Organized Health Care Education/Training Program

## 2024-02-15 VITALS — BP 157/78 | HR 88 | Temp 97.3°F | Resp 16 | Ht 61.75 in | Wt 234.0 lb

## 2024-02-15 DIAGNOSIS — M961 Postlaminectomy syndrome, not elsewhere classified: Secondary | ICD-10-CM | POA: Insufficient documentation

## 2024-02-15 DIAGNOSIS — G894 Chronic pain syndrome: Secondary | ICD-10-CM | POA: Insufficient documentation

## 2024-02-15 DIAGNOSIS — G8929 Other chronic pain: Secondary | ICD-10-CM | POA: Insufficient documentation

## 2024-02-15 DIAGNOSIS — M5416 Radiculopathy, lumbar region: Secondary | ICD-10-CM | POA: Insufficient documentation

## 2024-02-15 NOTE — Patient Instructions (Addendum)
 Soap scrub  [employer] SCS

## 2024-02-15 NOTE — Progress Notes (Signed)
 PROVIDER NOTE: Interpretation of information contained herein should be left to medically-trained personnel. Specific patient instructions are provided elsewhere under Patient Instructions section of medical record. This document was created in part using AI and STT-dictation technology, any transcriptional errors that may result from this process are unintentional.  Patient: Susan [name]  Service: E/M Encounter  Provider: Wallie Sherry, MD  DOB: [birth year]  Delivery: Face-to-face  Specialty: Interventional Pain Management  MRN: [id number]  Setting: Ambulatory outpatient facility  Specialty designation: 09  Type: New Patient  Location: Outpatient office facility  PCP: Abbey Bruckner, MD  DOS: 02/15/2024    Referring Prov.: Clois Fret, MD   Primary Reason(s) for Visit: Encounter for initial evaluation of one or more chronic problems (new to examiner) potentially causing chronic pain, and posing a threat to normal musculoskeletal function. (Level of risk: High) CC: Back Pain (Lumbar bilateral but is really bad at midline )  HPI  Susan [name] is a 63 y.o. year old, female patient, who comes for the first time to our practice referred by Clois Fret, MD for our initial evaluation of her chronic pain. She has Subclinical hyperthyroidism; Goiter colloid, toxic, nodular; Hypertension; Bipolar I disorder (HCC); OSA on CPAP; Alcohol use disorder, severe, in sustained remission (HCC); Post-traumatic osteoarthritis of right knee; Chronic back pain; GERD (gastroesophageal reflux disease); Degenerative disc disease at L5-S1 level; Hyperlipidemia; Diverticulosis; Lumbar radiculitis; Aortic atherosclerosis; Centrilobular emphysema (HCC); Allergic rhinitis; Primary osteoarthritis of left hip; PTSD (post-traumatic stress disorder); Bereavement; Cocaine use disorder, moderate, in sustained remission (HCC); Bipolar 1 disorder, mixed, mild (HCC); Osteoporosis; Need for immunization against influenza; Morbid  obesity (HCC); Bipolar disorder, in full remission, most recent episode mixed; Anxiety; Bipolar affective disorder in remission; Bipolar disorder, in full remission, most recent episode depressed; Left carotid bruit; History of hepatitis C- treated 2014- 2015 with [facility] gastroenterology and followed. ; H/O thyroid  nodule; Type II diabetes mellitus with nephropathy (HCC); CVA (cerebrovascular accident) (HCC); Epigastric pain; Enlarged thyroid ; Synovial cyst of lumbar facet joint; Chronic radicular lumbar pain; S/P laminectomy; Epidural hematoma (HCC); Mass in epidural space; Normocytic normochromic anemia; Bipolar 1 disorder, depressed, moderate (HCC); Neurogenic bladder; Abnormality of gait and mobility; History of stroke; Right leg weakness; Dysuria; Morbid obesity with body mass index (BMI) of 40.0 to 44.9 in adult [facility]); Left thyroid  nodule; Hemiplegia and hemiparesis following cerebral infarction affecting right dominant side (HCC); CKD (chronic kidney disease) stage 3, GFR 30-59 ml/min (HCC); Urge incontinence; Painful and cold lower extremity; Failed back surgical syndrome; and Chronic pain syndrome on their problem list. Today she comes in for evaluation of her Back Pain (Lumbar bilateral but is really bad at midline )  Pain Assessment: Location: Lower, Left, Right Back Radiating: sometimes  down the outer aspect of right leg to the foot Onset: More than a month ago Duration: Chronic pain Quality: Discomfort, Constant, Burning, Sharp, Other (Comment), Numbness (feels like a cat is clawing in the spine./  numbness in right foot, residual stroke s/s, surgery made it worse) Severity: 7 /10 (subjective, self-reported pain score)  Effect on ADL: limiting Timing: Constant Modifying factors: Dr Avanell gives me Vicodin 1 per day and that helps for a very short time. BP: (!) 157/78  HR: 88  Onset and Duration: Sudden Cause of pain: Unknown Severity: Getting worse, NAS-11 at its worse: 10/10, NAS-11 at  its best: 5/10, NAS-11 now: 7/10, and NAS-11 on the average: 7/10 Timing: Afternoon, Night, During activity or exercise, and After activity or exercise Aggravating Factors: Lifiting, Prolonged  sitting, Prolonged standing, Surgery made it worse, Twisting, and Walking Alleviating Factors: Lying down, Medications, Resting, and TENS Associated Problems: Constipation, Fatigue, Inability to concentrate, Numbness, Sweating, Temperature changes, Tingling, Weakness, and Pain that wakes patient up Quality of Pain: Aching, Burning, Constant, Deep, Distressing, Exhausting, Nagging, Pressure-like, and Sharp Previous Examinations or Tests: MRI scan, X-rays, Nerve conduction test, Neurological evaluation, and Psychiatric evaluation Previous Treatments: Narcotic medications, Physical Therapy, and TENS  Susan [name] is being evaluated for possible interventional pain management therapies for the treatment of her chronic pain.   Discussed the use of AI scribe software for clinical note transcription with the patient, who gave verbal consent to proceed.  History of Present Illness   Susan [name] is a 63 year old female with a history of spine surgery and stroke (right leg weakness) who presents with chronic pain and numbness following spine surgery. Referred by Dr. Clois for evaluation of chronic pain and consideration of a spinal cord stimulator trial.  She underwent spine surgery on June 3rd to remove a cyst from her spine. Post-operatively, she experienced a spinal fluid leak, necessitating an overnight hospital stay. She was discharged the following day, but on June 5th, she experienced sudden leg weakness, described as 'like rubber,' and was unable to walk. She was readmitted to the hospital where a lumbar hematoma was diagnosed treated surgically. She remained hospitalized for eight days and initially could not walk or move her toes.  Since the surgery, she has been experiencing constant pain and  numbness, particularly in her foot and right side. The numbness on her right side was present to a lesser degree prior to surgery due to a previous stroke, but has worsened post-operatively. The pain is constant and impacts her daily life.  She has been working with physical therapy and has noted some improvement, as she can now wiggle her toes. She has tried a TENS unit and other electrical stimulation therapies during physical therapy sessions, which have provided temporary relief from her pain.  Her last lumbar MRI was over a year ago, and she recently had a thoracic MRI. She has completed a psychological evaluation in preparation for a spinal cord stimulator trial.        Meds   Current Outpatient Medications:    acetaminophen  (TYLENOL ) 500 MG tablet, Take 1,000 mg by mouth 2 (two) times daily., Disp: , Rfl:    amLODipine  (NORVASC ) 5 MG tablet, Take 1 tablet (5 mg total) by mouth daily., Disp: 180 tablet, Rfl: 3   aspirin  EC 81 MG tablet, Take 1 tablet (81 mg total) by mouth daily. *Swallow whole*, Disp: 30 tablet, Rfl: 6   bisacodyl  (ONELAX) 10 MG suppository, Unwrap and insert 1 suppository (10 mg total) rectally as needed for moderate constipation. *If no bowel movement with morning colace.*, Disp: 12 suppository, Rfl: 0   clotrimazole  (LOTRIMIN ) 1 % cream, Apply 1 application  topically 2 (two) times daily as needed., Disp: , Rfl:    cyanocobalamin  (VITAMIN B12) 100 MCG tablet, Take 1 tablet (100 mcg total) by mouth daily., Disp: 90 tablet, Rfl: 3   cyclobenzaprine  (FLEXERIL ) 5 MG tablet, Take 0.5-1 tablets (2.5-5 mg total) by mouth 2 (two) times daily as needed., Disp: 60 tablet, Rfl: 6   DULoxetine  (CYMBALTA ) 20 MG capsule, Take 1 capsule (20 mg total) by mouth daily. *Take with 30 mg capsule*, Disp: 30 capsule, Rfl: 7   DULoxetine  (CYMBALTA ) 30 MG capsule, Take 1 capsule (30 mg total) by mouth daily. *Take with  20 mg capsule.*, Disp: 30 capsule, Rfl: 9   gabapentin  (NEURONTIN ) 300 MG  capsule, Take 2 capsules (600 mg total) by mouth 2 (two) times daily., Disp: 360 capsule, Rfl: 1   HYDROcodone -acetaminophen  (NORCO/VICODIN) 5-325 MG tablet, Take 1 tablet by mouth daily as needed for moderate pain., Disp: , Rfl:    lurasidone  (LATUDA ) 20 MG TABS tablet, Take 1 tablet (20 mg total) by mouth daily with supper., Disp: 30 tablet, Rfl: 2   naloxone (NARCAN) nasal spray 4 mg/0.1 mL, Place into the nose., Disp: , Rfl:    rosuvastatin  (CRESTOR ) 20 MG tablet, Take 1 tablet (20 mg total) by mouth daily., Disp: 90 tablet, Rfl: 3   Semaglutide , 2 MG/DOSE, (OZEMPIC , 2 MG/DOSE,) 8 MG/3ML SOPN, Inject 2 mg into the skin once a week., Disp: 3 mL, Rfl: 3   Semaglutide , 2 MG/DOSE, (OZEMPIC , 2 MG/DOSE,) 8 MG/3ML SOPN, Inject 2mg  under the skin once a week, Disp: 3 mL, Rfl: 9   Semaglutide , 2 MG/DOSE, (OZEMPIC , 2 MG/DOSE,) 8 MG/3ML SOPN, Inject 2 mg into the skin once a week., Disp: 3 mL, Rfl: 6   telmisartan  (MICARDIS ) 80 MG tablet, Take 1 tablet (80 mg total) by mouth daily., Disp: 30 tablet, Rfl: 5   traZODone  (DESYREL ) 50 MG tablet, Take 1 tablet by mouth at bedtime as needed for sleep. for sleep, Disp: 30 tablet, Rfl: 2   Vibegron  (GEMTESA ) 75 MG TABS, Take 1 tablet (75 mg total) by mouth daily., Disp: 90 tablet, Rfl: 3   vitamin B-12 (CYANOCOBALAMIN ) 100 MCG tablet, Take 1 tablet (100 mcg total) by mouth daily., Disp: 90 tablet, Rfl: 3  Imaging Review  Cervical Imaging: Cervical MR wo contrast: Results for orders placed during the hospital encounter of 03/17/19  MR CERVICAL SPINE WO CONTRAST  Narrative CLINICAL DATA:  Bilateral shoulder pain for 3 months  EXAM: MRI CERVICAL SPINE WITHOUT CONTRAST  TECHNIQUE: Multiplanar, multisequence MR imaging of the cervical spine was performed. No intravenous contrast was administered.  COMPARISON:  None.  FINDINGS: Alignment: Grade 1 retrolisthesis at C3-4 and grade 1 anterolisthesis at C4-5.  Vertebrae: Normal marrow signal  Cord:  Normal  Posterior Fossa, vertebral arteries, paraspinal tissues: Negative  Disc levels:  C1-C2: Normal.  C2-C3: Normal disc space. Moderate left facet hypertrophy. No spinal canal or neuroforaminal stenosis.  C3-C4: Intermediate disc osteophyte complex with moderate right and severe left neural foraminal stenosis. No spinal canal stenosis.  C4-C5: Moderate right facet hypertrophy mild right foraminal stenosis. No spinal canal or left foraminal stenosis.  C5-C6: Left foraminal disc osteophyte complex with severe left foraminal stenosis. No central spinal canal stenosis.  C6-C7: Normal disc space and facets. No spinal canal or neuroforaminal stenosis.  C7-T1: Normal disc space and facets. No spinal canal or neuroforaminal stenosis.  IMPRESSION: 1. Severe left C5-6 neural foraminal stenosis due to foraminal disc osteophyte complex. 2. Moderate right and severe left C3-4 neural foraminal stenosis due to combination of disc osteophyte complex and facet arthrosis. 3. Grade 1 C3-4 retrolisthesis and C4-5 anterolisthesis.   Electronically Signed By: Franky Stanford M.D. On: 03/17/2019 22:12   Thoracic Imaging: Thoracic MR wo contrast: Results for orders placed during the hospital encounter of 02/07/24  MR THORACIC SPINE WO CONTRAST  Narrative EXAM: MRI THORACIC SPINE WITHOUT INTRAVENOUS CONTRAST 02/07/2024 05:08:14 PM  TECHNIQUE: Multiplanar multisequence MRI of the thoracic spine was performed without the administration of intravenous contrast.  COMPARISON: Lumbar spine series dated 01/30/2024.  CLINICAL HISTORY: chronic pain syndrome. Pain in lower  back primarily R side since surgery 1 year ago; Weakness in legs; Hx of steroid injections in nac; Changes to bowel and bladder; Previous MRI of lumbar spine.  FINDINGS:  BONES AND ALIGNMENT: Sigmoid scoliosis of the thoracolumbar spine. Slight degenerative anterolisthesis at T10-T11 with Modic type 1 reactive  changes within the endplates. There are no edematous changes within the disc space or paraspinous soft tissues at this level. Slight degenerative anterolisthesis also present at T2-T3 and T4-T5. Normal vertebral body heights. Bone marrow signal is unremarkable except for Modic type 1 changes at T10-T11. No abnormal enhancement.  SPINAL CORD: Normal spinal cord volume. Normal spinal cord signal. No spinal cord impingement evident.  SOFT TISSUES: Unremarkable.  DEGENERATIVE CHANGES: Diffuse chronic degenerative disc disease also present throughout the visualized thoracolumbar spine. Mild posterior disc bulging present throughout the thoracic region. No significant spinal canal or neural foraminal stenosis. No nerve root impingement evident.  IMPRESSION: 1. Mild posterior disc bulging throughout the thoracic region without significant spinal canal or neural foraminal stenosis. No spinal cord or nerve root impingement evident. 2. Slight degenerative anterolisthesis at T10-11 with Modic type 1 reactive endplate changes. No disc space or paraspinous soft tissue edema at this level. 3. Slight degenerative anterolisthesis at T2-3 and T4-5.  Electronically signed by: Evalene Coho MD 02/10/2024 05:43 AM EDT RP Workstation: [id number]   Narrative CLINICAL DATA:  Low back pain. Cauda equina syndrome suspected. Cyst removal from the spine two days ago. Postoperative urinary incontinence and bilateral leg numbness.  EXAM: MRI LUMBAR SPINE WITHOUT CONTRAST  TECHNIQUE: Multiplanar, multisequence MR imaging of the lumbar spine was performed. No intravenous contrast was administered.  COMPARISON:  05/18/2022  FINDINGS: Segmentation: 5 lumbar type vertebral bodies as numbered previously.  Alignment: Scoliotic curvature convex to the right as seen previously. No significant listhesis.  Vertebrae: Edematous change of the endplates at T10, U88 and T12, likely to relate to regional  pain. This is probably degenerative. Lesser vertebral endplate edema noted at L3-4 and L4-5.  Conus medullaris and cauda equina: Conus extends to the L1 level. Conus and cauda equina appear normal.  Paraspinal and other soft tissues: Posterior surgical approach at the L4-5 level from the right-side has an expected appearance.  Disc levels:  At the surgical level and extending within the spinal canal from the upper L3 level to the L 4 5 disc level, there is a fluid collection which causes marked compression of the thecal sac. This is most severe at the level of the L3-4 disc where the fluid collection within the canal measures 1.8 x 1.5 cm in diameter and markedly flattens the thecal sac anteriorly.  Other than the surgical decompression of the synovial cyst on the right and the postoperative intraspinal fluid collection, there is no change since the preoperative exam, as one might expect.  IMPRESSION: 1. At the surgical level and extending from the upper L3 level to the L4-5 disc level, there is a postoperative fluid collection within the spinal canal which causes marked compression of the thecal sac anteriorly. This is most severe at the level of the L3-4 disc where the fluid collection within the canal measures 1.8 x 1.5 cm in diameter and markedly flattens the thecal sac anteriorly.   Electronically Signed By: Oneil Officer M.D. On: 10/12/2022 16:20    Narrative CLINICAL DATA:  Prior surgery.  Chronic low back pain.  EXAM: DG LUMBAR SPINE 2-3V  COMPARISON:  None Available.  FINDINGS: Severe convex rightward scoliosis centered in the mid  lumbar spine. Diffuse degenerative disc disease and advanced degenerative facet disease. No fracture. SI joints symmetric and unremarkable.  IMPRESSION: Severe convex rightward scoliosis with advanced degenerative disc and facet disease diffusely.  No acute bony abnormality.   Electronically Signed By: Franky Crease M.D. On:  02/04/2024 20:56  Narrative CLINICAL DATA:  Post LEFT hip replacement surgery  EXAM: DG HIP (WITH OR WITHOUT PELVIS) 2-3V LEFT  COMPARISON:  01/29/2013  FINDINGS: Interval placement of a LEFT hip prosthesis.  Bones demineralized.  No fracture or dislocation.  Progressive degenerative changes at pubic symphysis.  IMPRESSION: LEFT hip prosthesis without acute complication   Electronically Signed By: Oneil Kiss M.D. On: 07/23/2019 10:26   Narrative CLINICAL DATA:  Right knee pain with swelling along the anterior and posterior aspects.  EXAM: MRI OF THE RIGHT KNEE WITHOUT CONTRAST  TECHNIQUE: Multiplanar, multisequence MR imaging of the knee was performed. No intravenous contrast was administered.  COMPARISON:  None.  FINDINGS: MENISCI  Medial meniscus: Flap tear of the periphery of the body of the medial meniscus extending into the gutter.  Lateral meniscus:  Intact.  LIGAMENTS  Cruciates:  Intact ACL and PCL.  Collaterals: Medial collateral ligament is intact. Lateral collateral ligament complex is intact.  CARTILAGE  Patellofemoral: High-grade partial-thickness cartilage loss with areas of full-thickness cartilage loss of the lateral patellar facet and lateral trochlea with subchondral reactive marrow changes. Partial-thickness cartilage loss of the medial patellofemoral compartment.  Medial: Full-thickness cartilage loss of the medial femoral condyle and medial tibial plateau with subchondral reactive marrow edema in the medial tibial plateau.  Lateral: 9 mm focal area of full-thickness cartilage loss of the weight-bearing surface of the lateral tibial plateau. Chondromalacia of the lateral femoral condyle.  Joint: No joint effusion. Normal Hoffa's fat. No plical thickening.  Popliteal Fossa:  Small Baker cyst.  Intact popliteus tendon.  Extensor Mechanism:  Intact.  Bones: No other marrow signal abnormality. No fracture  or dislocation.  Other: No fluid collection or hematoma.  IMPRESSION: 1. Tricompartmental cartilage abnormalities most severe in the patellofemoral compartment and medial femorotibial compartment. 2. Flap tear of the periphery of the body of the medial meniscus extending into the gutter. 3. Small Baker cyst.   Electronically Signed By: Julaine Blanch On: 01/19/2016 15:14  DG Foot Complete Left  Narrative CLINICAL DATA:  LEFT foot pain after injury  EXAM: LEFT FOOT - COMPLETE 3+ VIEW  COMPARISON:  None.  FINDINGS: Minimally displaced fracture at the base of the RIGHT fifth metatarsal bone. Osseous structures of the RIGHT foot are otherwise intact and normal alignment.  IMPRESSION: Minimally displaced fracture at the base of the RIGHT fifth metatarsal bone. Location and configuration suggests avulsion fracture rather than Jones fracture.   Electronically Signed By: Lael Hines M.D. On: 09/22/2017 22:56    Complexity Note: Imaging results reviewed.                         ROS  Cardiovascular: High blood pressure Pulmonary or Respiratory: Smoking Neurological: Stroke (Residual deficits or weakness: n/a) and Curved spine Psychological-Psychiatric: Anxiousness Gastrointestinal: Inflamed liver (Hepatitis) Genitourinary: No reported renal or genitourinary signs or symptoms such as difficulty voiding or producing urine, peeing blood, non-functioning kidney, kidney stones, difficulty emptying the bladder, difficulty controlling the flow of urine, or chronic kidney disease Hematological: No reported hematological signs or symptoms such as prolonged bleeding, low or poor functioning platelets, bruising or bleeding easily, hereditary bleeding problems, low energy levels  due to low hemoglobin or being anemic Endocrine: High blood sugar controlled without the use of insulin  (NIDDM) Rheumatologic: No reported rheumatological signs and symptoms such as fatigue, joint pain,  tenderness, swelling, redness, heat, stiffness, decreased range of motion, with or without associated rash Musculoskeletal: Negative for myasthenia gravis, muscular dystrophy, multiple sclerosis or malignant hyperthermia Work History: Retired  Allergies  Ms. Johannesen is allergic to wellbutrin  [bupropion ], furosemide, and hydrochlorothiazide .  Laboratory Chemistry Profile   Renal Lab Results  Component Value Date   BUN 10 12/04/2023   CREATININE 1.35 (H) 12/04/2023   LABCREA 213 10/16/2019   BCR 7 12/04/2023   GFR 37.81 (L) 03/28/2023   GFRAA 64 05/28/2020   GFRNONAA 22 (L) 11/17/2023   SPECGRAV 1.010 11/03/2023   PHUR 6.0 11/03/2023   PROTEINUR NEGATIVE 11/17/2023     Electrolytes Lab Results  Component Value Date   NA 137 12/04/2023   K 4.3 12/04/2023   CL 104 12/04/2023   CALCIUM  8.8 12/04/2023   MG 2.4 07/06/2022   PHOS 5.0 (H) 07/06/2022     Hepatic Lab Results  Component Value Date   AST 14 12/04/2023   ALT 10 12/04/2023   ALBUMIN 3.7 11/17/2023   ALKPHOS 82 11/17/2023   LIPASE 6.0 (L) 07/22/2022     ID Lab Results  Component Value Date   HIV Non Reactive 07/05/2022   SARSCOV2NAA NEGATIVE 07/07/2021   STAPHAUREUS NEGATIVE 10/05/2022   MRSAPCR NEGATIVE 10/05/2022     Bone Lab Results  Component Value Date   VD25OH 41.34 11/04/2022     Endocrine Lab Results  Component Value Date   GLUCOSE 81 12/04/2023   GLUCOSEU NEGATIVE 12/22/2023   HGBA1C 5.3 12/04/2023   TSH 1.06 03/28/2023   FREET4 0.88 07/26/2018     Neuropathy Lab Results  Component Value Date   VITAMINB12 344 12/04/2023   FOLATE 6.5 07/26/2018   HGBA1C 5.3 12/04/2023   HIV Non Reactive 07/05/2022     CNS No results found for: COLORCSF, APPEARCSF, RBCCOUNTCSF, WBCCSF, POLYSCSF, LYMPHSCSF, EOSCSF, PROTEINCSF, GLUCCSF, JCVIRUS, CSFOLI, IGGCSF, LABACHR, ACETBL   Inflammation (CRP: Acute  ESR: Chronic) No results found for: CRP, ESRSEDRATE,  LATICACIDVEN   Rheumatology No results found for: RF, ANA, LABURIC, URICUR, LYMEIGGIGMAB, LYMEABIGMQN, HLAB27   Coagulation Lab Results  Component Value Date   INR 1.1 08/02/2022   LABPROT 14.3 08/02/2022   APTT 31 08/02/2022   PLT 219.0 12/22/2023   DDIMER 0.50 10/13/2022     Cardiovascular Lab Results  Component Value Date   BNP 95.9 10/13/2022   CKTOTAL 86 04/28/2021   CKMB 1.5 04/28/2021   TROPONINI <0.03 12/16/2016   HGB 11.3 (L) 12/22/2023   HCT 33.7 (L) 12/22/2023     Screening Lab Results  Component Value Date   SARSCOV2NAA NEGATIVE 07/07/2021   STAPHAUREUS NEGATIVE 10/05/2022   MRSAPCR NEGATIVE 10/05/2022   HIV Non Reactive 07/05/2022     Cancer No results found for: CEA, CA125, LABCA2   Allergens No results found for: ALMOND, APPLE, ASPARAGUS, AVOCADO, BANANA, BARLEY, BASIL, BAYLEAF, GREENBEAN, LIMABEAN, WHITEBEAN, BEEFIGE, REDBEET, BLUEBERRY, BROCCOLI, CABBAGE, MELON, CARROT, CASEIN, CASHEWNUT, CAULIFLOWER, CELERY     Note: Lab results reviewed.  PFSH  Drug: Ms. Tang  reports no history of drug use. Alcohol:  reports no history of alcohol use. Tobacco:  reports that she has been smoking cigarettes. She started smoking about 48 years ago. She has a 24.4 pack-year smoking history. She has never used smokeless tobacco. Medical:  has a past  medical history of ADD (attention deficit disorder), Alcohol abuse, Anemia, Anxiety, Aortic atherosclerosis (02/20/2018), Arthritis, Asthma, Bipolar disorder (HCC), Centrilobular emphysema (HCC), Chronic diarrhea (07/22/2022), Cocaine use disorder, moderate, in sustained remission (HCC), Constipation, Degenerative disc disease at L5-S1 level (09/28/2016), Depression, Diabetes mellitus, type 2 (HCC), Diverticulitis (2013), DOE (dyspnea on exertion), Drug use, Family history of adverse reaction to anesthesia, GERD (gastroesophageal reflux disease), H/O suicide  attempt, Hepatitis C (06/26/2014), Hip pain, History of echocardiogram, History of MRSA infection (2013), HLD (hyperlipidemia), Hypertension, Hyponatremia, Hypothyroidism, Incisional hernia (11/08/2012), Knee pain, Left carotid bruit, Morbid obesity (HCC), Multinodular thyroid , OSA (obstructive sleep apnea), OSA on CPAP, Osteoporosis, Palpitations, Post-traumatic osteoarthritis of right knee (09/24/2015), Recurrent ventral hernia (11/08/2012), Skin lesion (06/28/2022), Status post total hip replacement, 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 Family: family history includes Alcohol abuse in her father, mother, sister, sister, son, and son; Arthritis in her brother and mother; Asthma in her mother; Bipolar disorder in her mother; COPD in her mother; Cancer in her brother; Congestive Heart Failure in her mother; Depression in her sister; Drug abuse in her daughter, sister, sister, son, and son; Eating disorder in her mother; Fibromyalgia in her sister; Heart attack in her father; Heart disease in her mother; Mental illness in her brother, mother, sister, sister, and sister; Obesity in her sister; Pneumonia in her sister; Thyroid  disease in her mother.  Past Surgical History:  Procedure Laterality Date   BILATERAL SALPINGOOPHORECTOMY     due to abnormal mass   BREAST BIOPSY Left    neg   BREAST SURGERY Left 20 yrs ago   CESAREAN SECTION     COLONOSCOPY     COLONOSCOPY WITH PROPOFOL  N/A 10/16/2017   Procedure: COLONOSCOPY WITH PROPOFOL ;  Surgeon: Therisa Bi, MD;  Location: [facility] ENDOSCOPY;  Service: Gastroenterology;  Laterality: N/A;   COLONOSCOPY WITH PROPOFOL  N/A 02/16/2021   Procedure: COLONOSCOPY WITH PROPOFOL ;  Surgeon: Therisa Bi, MD;  Location: [facility] ENDOSCOPY;  Service: Gastroenterology;   Laterality: N/A;   FORAMINOTOMY 1 LEVEL Right 10/10/2022   Procedure: RIGHT L4-5 LAMINOFORAMINOTOMY;  Surgeon: Clois Fret, MD;  Location: [facility] ORS;  Service: Neurosurgery;  Laterality: Right;   HERNIA REPAIR  06/2011, July 2014   Ventral wall repair with Physiomesh   HERNIA REPAIR     2nd.vental wall repair   JOINT REPLACEMENT Right    knee   LUMBAR LAMINECTOMY/ DECOMPRESSION WITH MET-RX N/A 10/12/2022   Procedure: LUMBAR LAMINECTOMY/ DECOMPRESSION WITH MET-RX;  Surgeon: Clois Fret, MD;  Location: [facility] ORS;  Service: Neurosurgery;  Laterality: N/A;   TONSILLECTOMY     TOTAL HIP ARTHROPLASTY Left 07/23/2019   Procedure: TOTAL HIP ARTHROPLASTY;  Surgeon: Edie Norleen PARAS, MD;  Location: [facility] ORS;  Service: Orthopedics;  Laterality: Left;   TOTAL KNEE ARTHROPLASTY Right 05/10/2016   Procedure: TOTAL KNEE ARTHROPLASTY;  Surgeon: Norleen PARAS Edie, MD;  Location: [facility] ORS;  Service: Orthopedics;  Laterality: Right;   TUBAL LIGATION     Active Ambulatory Problems    Diagnosis Date Noted   Subclinical hyperthyroidism 06/26/2014   Goiter colloid, toxic, nodular 06/26/2014   Hypertension 06/26/2014   Bipolar I disorder (HCC) 06/26/2014   OSA on CPAP 04/09/2015   Alcohol use disorder, severe, in sustained remission (HCC) 04/13/2015   Post-traumatic osteoarthritis of right knee 09/24/2015   Chronic back pain 10/09/2015   GERD (gastroesophageal reflux disease) 12/17/2015  Degenerative disc disease at L5-S1 level 09/28/2016   Hyperlipidemia 03/28/2017   Diverticulosis 08/24/2013   Lumbar radiculitis 10/03/2013   Aortic atherosclerosis 02/20/2018   Centrilobular emphysema (HCC) 02/20/2018   Allergic rhinitis 08/23/2018   Primary osteoarthritis of left hip 06/14/2019   PTSD (post-traumatic stress disorder) 09/27/2019   Bereavement 09/27/2019   Cocaine use disorder, moderate, in sustained remission (HCC) 09/27/2019   Bipolar 1 disorder, mixed, mild (HCC) 10/29/2019   Osteoporosis  11/06/2019   Need for immunization against influenza 01/27/2020   Morbid obesity (HCC) 01/27/2020   Bipolar disorder, in full remission, most recent episode mixed 03/06/2020   Anxiety 05/28/2020   Bipolar affective disorder in remission 05/28/2020   Bipolar disorder, in full remission, most recent episode depressed 02/05/2021   Left carotid bruit 04/28/2021   History of hepatitis C- treated 2014- 2015 with [facility] gastroenterology and followed.  05/19/2021   H/O thyroid  nodule 05/31/2021   Type II diabetes mellitus with nephropathy (HCC) 06/16/2021   CVA (cerebrovascular accident) (HCC) 07/05/2022   Epigastric pain 07/22/2022   Enlarged thyroid  07/22/2022   Synovial cyst of lumbar facet joint 10/10/2022   Chronic radicular lumbar pain 10/10/2022   S/P laminectomy 10/10/2022   Epidural hematoma (HCC) 10/12/2022   Mass in epidural space 10/12/2022   Normocytic normochromic anemia 10/13/2022   Bipolar 1 disorder, depressed, moderate (HCC) 11/29/2022   Neurogenic bladder 02/13/2023   Abnormality of gait and mobility 02/13/2023   History of stroke 04/17/2023   Right leg weakness 04/17/2023   Dysuria 11/03/2023   Morbid obesity with body mass index (BMI) of 40.0 to 44.9 in adult [facility]) 01/10/2023   Left thyroid  nodule 01/10/2023   Hemiplegia and hemiparesis following cerebral infarction affecting right dominant side (HCC) 12/04/2023   CKD (chronic kidney disease) stage 3, GFR 30-59 ml/min (HCC) 12/04/2023   Urge incontinence 12/22/2023   Painful and cold lower extremity 12/22/2023   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   Chronic pain syndrome 02/15/2024   Resolved Ambulatory Problems    Diagnosis Date Noted   Recurrent ventral hernia 11/08/2012   Hepatitis C 06/26/2014   Tobacco use disorder, continuous 06/26/2014   Need for Streptococcus pneumoniae and influenza vaccination 04/09/2015   Polyuria 04/09/2015   Weight gain, abnormal 04/09/2015   Vitamin D  deficiency 04/09/2015    Vitamin B12 deficiency 04/09/2015   Medication monitoring encounter 04/09/2015   Breast cancer screening 04/09/2015   Lymphocytosis 04/10/2015   Impaired fasting glucose 04/10/2015   Postmenopausal 07/08/2015   Preventative health care 07/08/2015   Cervical cancer screening 07/08/2015   Pain in right knee 08/03/2015   Abdominal pain 10/09/2015   Inflamed skin tag 12/17/2015   Diastasis recti 12/18/2015   Cracked skin on feet 01/19/2016   Nonspecific chest pain 04/12/2016   Status post total right knee replacement using cement 05/10/2016   Low HDL (under 40) 10/14/2016   Dry mouth 05/20/2014   Incisional hernia 11/08/2012   Throat dryness 05/20/2014   Encounter for screening for HIV 05/16/2017   Hyperinsulinemia 08/23/2018   Depression 10/23/2018   Class 3 severe obesity with serious comorbidity and body mass index (BMI) of 40.0 to 44.9 in adult [facility]) 10/23/2018   Prediabetes 12/31/2018   Insulin  resistance 02/18/2019   Status post total hip replacement, left 07/23/2019   Routine physical examination 01/27/2020   Fatigue 01/27/2020   Cough 03/18/2020   Nervousness 05/28/2020   Mild depressed bipolar 1 disorder (HCC) 09/02/2020   History of COVID-19 04/28/2021   Dyspnea  04/28/2021   Class 2 drug-induced obesity with serious comorbidity and body mass index (BMI) of 38.0 to 38.9 in adult 06/16/2021   Low sodium levels 07/14/2021   BMI 38.0-38.9,adult 07/14/2021   Urine frequency 09/07/2021   Skin lesion 06/28/2022   Chronic diarrhea 07/22/2022   Preop examination 09/28/2022   Cauda equina syndrome (HCC) 10/12/2022   Acute respiratory failure with hypoxia (HCC) 10/13/2022   AKI (acute kidney injury) 10/13/2022   Blister of foot without infection, right, initial encounter 01/05/2023   Neurogenic bowel 02/13/2023   Unsteadiness on feet 04/17/2023   Cauda equina spinal cord injury (HCC) 04/17/2023   Past Medical History:  Diagnosis Date   ADD (attention deficit disorder)     Alcohol abuse    Anemia    Arthritis    Asthma    Bipolar disorder (HCC)    Constipation    Diabetes mellitus, type 2 (HCC)    Diverticulitis 2013   DOE (dyspnea on exertion)    Drug use    Family history of adverse reaction to anesthesia    H/O suicide attempt    Hip pain    History of echocardiogram    History of MRSA infection 2013   HLD (hyperlipidemia)    Hyponatremia    Hypothyroidism    Knee pain    Multinodular thyroid     OSA (obstructive sleep apnea)    Palpitations    Stroke (HCC) 07/05/2022   Tobacco use disorder    Constitutional Exam  General appearance: Well nourished, well developed, and well hydrated. In no apparent acute distress Vitals:   02/15/24 0800  BP: (!) 157/78  Pulse: 88  Resp: 16  Temp: (!) 97.3 F (36.3 C)  TempSrc: Temporal  SpO2: 97%  Weight: 234 lb (106.1 kg)  Height: 5' 1.75 (1.568 m)   BMI Assessment: Estimated body mass index is 43.15 kg/m as calculated from the following:   Height as of this encounter: 5' 1.75 (1.568 m).   Weight as of this encounter: 234 lb (106.1 kg).  BMI interpretation table: BMI level Category Range association with higher incidence of chronic pain  <18 kg/m2 Underweight   18.5-24.9 kg/m2 Ideal body weight   25-29.9 kg/m2 Overweight Increased incidence by 20%  30-34.9 kg/m2 Obese (Class I) Increased incidence by 68%  35-39.9 kg/m2 Severe obesity (Class II) Increased incidence by 136%  >40 kg/m2 Extreme obesity (Class III) Increased incidence by 254%   Patient's current BMI Ideal Body weight  Body mass index is 43.15 kg/m. Ideal body weight: 49.5 kg (109 lb 2.9 oz) Adjusted ideal body weight: 72.2 kg (159 lb 1.8 oz)   BMI Readings from Last 4 Encounters:  02/15/24 43.15 kg/m  02/06/24 43.24 kg/m  01/30/24 40.97 kg/m  01/24/24 40.97 kg/m   Wt Readings from Last 4 Encounters:  02/15/24 234 lb (106.1 kg)  02/06/24 236 lb 6.4 oz (107.2 kg)  01/30/24 224 lb (101.6 kg)  01/24/24 224 lb (101.6  kg)    Psych/Mental status: Alert, oriented x 3 (person, place, & time)       Eyes: PERLA Respiratory: No evidence of acute respiratory distress  Thoracic Spine Area Exam  Skin & Axial Inspection: No masses, redness, or swelling Alignment: Symmetrical Functional ROM: Unrestricted ROM Stability: No instability detected Muscle Tone/Strength: Functionally intact. No obvious neuro-muscular anomalies detected. Sensory (Neurological): Unimpaired Muscle strength & Tone: No palpable anomalies Lumbar Spine Area Exam  Skin & Axial Inspection: Well healed scar from previous spine surgery detected Alignment:  Symmetrical Functional ROM: Pain restricted ROM       Stability: No instability detected Muscle Tone/Strength: Functionally intact. No obvious neuro-muscular anomalies detected. Sensory (Neurological): Neurogenic pain pattern Palpation: No palpable anomalies        Gait & Posture Assessment  Ambulation: Patient ambulates using a walker Gait: Age-related, senile gait pattern Posture: Difficulty standing up straight, due to pain  Lower Extremity Exam    Side: Right lower extremity  Side: Left lower extremity  Stability: No instability observed          Stability: No instability observed          Skin & Extremity Inspection: Skin color, temperature, and hair growth are WNL. No peripheral edema or cyanosis. No masses, redness, swelling, asymmetry, or associated skin lesions. No contractures.  Skin & Extremity Inspection: Skin color, temperature, and hair growth are WNL. No peripheral edema or cyanosis. No masses, redness, swelling, asymmetry, or associated skin lesions. No contractures.  Functional ROM: Pain restricted ROM                  Functional ROM: Unrestricted ROM                  Muscle Tone/Strength: Functionally intact. No obvious neuro-muscular anomalies detected.  Muscle Tone/Strength: Functionally intact. No obvious neuro-muscular anomalies detected.  Sensory (Neurological):  Neurogenic pain pattern        Sensory (Neurological): Unimpaired        DTR: Patellar: 1+: trace Achilles: 1+: trace Plantar: deferred today  DTR: Patellar: deferred today Achilles: deferred today Plantar: deferred today  Palpation: No palpable anomalies  Palpation: No palpable anomalies    Assessment  Primary Diagnosis & Pertinent Problem List: The primary encounter diagnosis was Lumbar post-laminectomy syndrome. Diagnoses of Failed back surgical syndrome, Chronic radicular lumbar pain, and Chronic pain syndrome were also pertinent to this visit.  Visit Diagnosis (New problems to examiner): 1. Lumbar post-laminectomy syndrome   2. Failed back surgical syndrome   3. Chronic radicular lumbar pain   4. Chronic pain syndrome    Plan of Care (Initial workup plan)  The patient presents with chronic, intractable pain involving both mechanical low back pain and neurogenic components radiating into the lower extremities. Given the patient's history of failed back surgery syndrome and radiculopathy, and limited response to conservative and interventional therapies, a spinal cord stimulator (SCS) trial is being considered. While SCS is typically more effective for neuropathic and appendicular pain, we discussed that some patients may also experience relief in axial low back and hip pain. The potential benefits and risks of spinal cord stimulation were thoroughly reviewed.  The proposed plan includes a percutaneous spinal cord stimulator trial. The patient was informed that this will involve temporary placement of epidural leads connected to an external pulse generator, which will be used over a 7-day trial period. We discussed the possibility of a mid-trial in-office visit to adjust settings and optimize programming in order to give the patient the best chance of success. The patient will receive daily support from the device representative throughout the trial.  We reviewed the benefits of SCS,  which include potential substantial pain relief, reduction in the use of oral pain medications including opioids, and long-term programmable therapy that can reduce reliance on repeated injections and other pain interventions. Risks were also discussed in detail and include potential surgical complications such as infection, bleeding, CSF leak, lead migration or fracture, hardware malfunction, and the possibility of either no pain relief or  worsening of symptoms. The patient was advised that spinal cord stimulation is not a guaranteed solution or a "magic bullet," but rather a potentially valuable therapy in appropriately selected cases.  I was able to assess the patient's interlaminar windows under live fluoroscopy and they appear fairly collapsed.  I did discuss this with the patient and stated that we may only be able to get in 1 lead percutaneously.  She does have viable access points at T12-L1 and L2-L3 specifically approaching from the right.  I will need an updated lumbar MRI  As part of standard protocol, patient has completed comprehensive psychosocial and behavioral evaluation prior to the trial.  We had a thorough and detailed discussion reviewing the rationale, alternatives, risks, and expected outcomes. The patient stated that all questions were answered to their satisfaction, demonstrated appropriate understanding, and expressed readiness to proceed. There were no barriers to understanding the treatment plan, and the explanation was well received. The patient is eager to move forward with the spinal cord stimulator trial once the necessary evaluation is complete.   Imaging Orders         MR LUMBAR SPINE WO CONTRAST         DG PAIN CLINIC C-ARM 1-60 MIN NO REPORT     Procedure Orders         [place] TRIAL    Provider-requested follow-up: Return in about 27 days (around 03/13/2024) for [employer] SCS trial, ECT.  Future Appointments  Date Time Provider Department Center  02/19/2024  2:30 PM  Theotis Toribio PARAS, PT [facility] None  02/22/2024  1:45 PM Theotis Toribio PARAS, PT [facility] None  02/23/2024  9:30 AM [facility]  02/26/2024  3:15 PM Theotis Toribio PARAS, PT [facility] None  02/29/2024  1:45 PM Theotis Toribio PARAS, PT [facility] None  03/04/2024  1:45 PM Theotis Toribio PARAS, PT [facility] None  03/07/2024  1:45 PM Theotis Toribio PARAS, PT [facility] None  03/25/2024  8:00 AM Abbey Bruckner, MD [facility] [address]  04/01/2024  9:30 AM [facility] ANNUAL WELLNESS VISIT [facility] [address]  04/09/2024  8:40 AM Darliss Rogue, MD [facility] None  04/22/2024  2:15 PM Nieves Cough, MD [facility] None  05/07/2024 11:40 AM Eappen, Saramma, MD [facility] None   I discussed the assessment and treatment plan with the patient. The patient was provided an opportunity to ask questions and all were answered. The patient agreed with the plan and demonstrated an understanding of the instructions.  Patient advised to call back or seek an in-person evaluation if the symptoms or condition worsens.  I personally spent a total of 60 minutes in the care of the patient today including preparing to see the patient, getting/reviewing separately obtained history, performing a medically appropriate exam/evaluation, counseling and educating, placing orders, and documenting clinical information in the EHR.   Note by: Wallie Sherry, MD (TTS and AI technology used. I apologize for any typographical errors that were not detected and corrected.) Date: 02/15/2024; Time: 10:20 AM

## 2024-02-15 NOTE — Progress Notes (Signed)
 Safety precautions to be maintained throughout the outpatient stay will include: orient to surroundings, keep bed in low position, maintain call bell within reach at all times, provide assistance with transfer out of bed and ambulation.

## 2024-02-19 ENCOUNTER — Ambulatory Visit: Admitting: Physical Therapy

## 2024-02-19 DIAGNOSIS — M5459 Other low back pain: Secondary | ICD-10-CM

## 2024-02-19 DIAGNOSIS — R2689 Other abnormalities of gait and mobility: Secondary | ICD-10-CM

## 2024-02-19 DIAGNOSIS — R262 Difficulty in walking, not elsewhere classified: Secondary | ICD-10-CM

## 2024-02-19 DIAGNOSIS — M6281 Muscle weakness (generalized): Secondary | ICD-10-CM

## 2024-02-19 NOTE — Therapy (Signed)
 OUTPATIENT PHYSICAL THERAPY THORACOLUMBAR AND BALANCE PROGRESS  Dates of reporting:  01/01/24-02/19/24  Patient Name: Susan [name] MRN: [id number] DOB:[birth year], 63 y.o., female Today's Date: 02/19/2024  END OF SESSION:  PT End of Session - 02/19/24 1436     Visit Number 10    Number of Visits 24    Date for Recertification  03/25/24    Authorization Type [facility] Medicare Dual 2025    Authorization - Visit Number 10    Authorization - Number of Visits 24    Progress Note Due on Visit 10    PT Start Time 1430    PT Stop Time 1515    PT Time Calculation (min) 45 min    Activity Tolerance Patient tolerated treatment well    Behavior During Therapy [facility] for tasks assessed/performed               Past Medical History:  Diagnosis Date   ADD (attention deficit disorder)    Alcohol abuse    Anemia    Anxiety    Aortic atherosclerosis 02/20/2018   Chest CT Sept 2019   Arthritis    rheumatoid arthritis   Asthma    Bipolar disorder (HCC)    Centrilobular emphysema (HCC)    Chronic diarrhea 07/22/2022   Cocaine use disorder, moderate, in sustained remission (HCC)    Constipation    Degenerative disc disease at L5-S1 level 09/28/2016   See ortho note May 2018   Depression    bipolar, hx of suicide attempt   Diabetes mellitus, type 2 (HCC)    Diverticulitis 2013   DOE (dyspnea on exertion)    Drug use    Family history of adverse reaction to anesthesia    mom-delayed emergence   GERD (gastroesophageal reflux disease)    H/O suicide attempt    slit wrists   Hepatitis C 06/26/2014   treated with Harvoni   Hip pain    History of echocardiogram    a. 04/2019 Echo: EF >65%, nl RV fxn.   History of MRSA infection 2013   HLD (hyperlipidemia)    Hypertension    a. 06/2021 Renal Duplex: ? bilat RAS; b. 06/2021 CTA Abd: No signif RAS.   Hyponatremia    Hypothyroidism    Incisional hernia 11/08/2012   Knee pain    Left carotid bruit    Morbid obesity (HCC)     Multinodular thyroid     OSA (obstructive sleep apnea)    OSA on CPAP    Osteoporosis    Palpitations    Post-traumatic osteoarthritis of right knee 09/24/2015   Recurrent ventral hernia 11/08/2012   Skin lesion 06/28/2022   Status post total hip replacement, left 07/23/2019   Status post total right knee replacement using cement 05/10/2016   Stroke (HCC) 07/05/2022   right arm,torso and right leg numbness   Tobacco use disorder    Tobacco use disorder, continuous 06/26/2014   Vitamin B12 deficiency    Vitamin D  deficiency 04/09/2015   Past Surgical History:  Procedure Laterality Date   BILATERAL SALPINGOOPHORECTOMY     due to abnormal mass   BREAST BIOPSY Left    neg   BREAST SURGERY Left 20 yrs ago   CESAREAN SECTION     COLONOSCOPY     COLONOSCOPY WITH PROPOFOL  N/A 10/16/2017   Procedure: COLONOSCOPY WITH PROPOFOL ;  Surgeon: Therisa Bi, MD;  Location: [facility] ENDOSCOPY;  Service: Gastroenterology;  Laterality: N/A;   COLONOSCOPY WITH PROPOFOL  N/A 02/16/2021   Procedure:  COLONOSCOPY WITH PROPOFOL ;  Surgeon: Therisa Bi, MD;  Location: [facility] ENDOSCOPY;  Service: Gastroenterology;  Laterality: N/A;   FORAMINOTOMY 1 LEVEL Right 10/10/2022   Procedure: RIGHT L4-5 LAMINOFORAMINOTOMY;  Surgeon: Clois Fret, MD;  Location: [facility] ORS;  Service: Neurosurgery;  Laterality: Right;   HERNIA REPAIR  06/2011, July 2014   Ventral wall repair with Physiomesh   HERNIA REPAIR     2nd.vental wall repair   JOINT REPLACEMENT Right    knee   LUMBAR LAMINECTOMY/ DECOMPRESSION WITH MET-RX N/A 10/12/2022   Procedure: LUMBAR LAMINECTOMY/ DECOMPRESSION WITH MET-RX;  Surgeon: Clois Fret, MD;  Location: [facility] ORS;  Service: Neurosurgery;  Laterality: N/A;   TONSILLECTOMY     TOTAL HIP ARTHROPLASTY Left 07/23/2019   Procedure: TOTAL HIP ARTHROPLASTY;  Surgeon: Edie Norleen PARAS, MD;  Location: [facility] ORS;  Service: Orthopedics;  Laterality: Left;   TOTAL KNEE ARTHROPLASTY Right 05/10/2016   Procedure:  TOTAL KNEE ARTHROPLASTY;  Surgeon: Norleen PARAS Edie, MD;  Location: [facility] ORS;  Service: Orthopedics;  Laterality: Right;   TUBAL LIGATION     Patient Active Problem List   Diagnosis Date Noted   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   Chronic pain syndrome 02/15/2024   Urge incontinence 12/22/2023   Painful and cold lower extremity 12/22/2023   Hemiplegia and hemiparesis following cerebral infarction affecting right dominant side (HCC) 12/04/2023   CKD (chronic kidney disease) stage 3, GFR 30-59 ml/min (HCC) 12/04/2023   Dysuria 11/03/2023   History of stroke 04/17/2023   Right leg weakness 04/17/2023   Neurogenic bladder 02/13/2023   Abnormality of gait and mobility 02/13/2023   Morbid obesity with body mass index (BMI) of 40.0 to 44.9 in adult (HCC) 01/10/2023   Left thyroid  nodule 01/10/2023   Bipolar 1 disorder, depressed, moderate (HCC) 11/29/2022   Normocytic normochromic anemia 10/13/2022   Epidural hematoma (HCC) 10/12/2022   Mass in epidural space 10/12/2022   Synovial cyst of lumbar facet joint 10/10/2022   Chronic radicular lumbar pain 10/10/2022   S/P laminectomy 10/10/2022   Epigastric pain 07/22/2022   Enlarged thyroid  07/22/2022   CVA (cerebrovascular accident) (HCC) 07/05/2022   Type II diabetes mellitus with nephropathy (HCC) 06/16/2021   H/O thyroid  nodule 05/31/2021   History of hepatitis C- treated 2014- 2015 with [facility] gastroenterology and followed.  05/19/2021   Left carotid bruit 04/28/2021   Bipolar disorder, in full remission, most recent episode depressed 02/05/2021   Anxiety 05/28/2020   Bipolar affective disorder in remission 05/28/2020   Bipolar disorder, in full remission, most recent episode mixed 03/06/2020   Need for immunization against influenza 01/27/2020   Morbid obesity (HCC) 01/27/2020   Osteoporosis 11/06/2019   Bipolar 1 disorder, mixed, mild (HCC) 10/29/2019   PTSD (post-traumatic stress disorder) 09/27/2019   Bereavement 09/27/2019    Cocaine use disorder, moderate, in sustained remission (HCC) 09/27/2019   Primary osteoarthritis of left hip 06/14/2019   Allergic rhinitis 08/23/2018   Aortic atherosclerosis 02/20/2018   Centrilobular emphysema (HCC) 02/20/2018   Hyperlipidemia 03/28/2017   Degenerative disc disease at L5-S1 level 09/28/2016   GERD (gastroesophageal reflux disease) 12/17/2015   Chronic back pain 10/09/2015   Post-traumatic osteoarthritis of right knee 09/24/2015   Alcohol use disorder, severe, in sustained remission (HCC) 04/13/2015   OSA on CPAP 04/09/2015   Subclinical hyperthyroidism 06/26/2014   Goiter colloid, toxic, nodular 06/26/2014   Hypertension 06/26/2014   Bipolar I disorder (HCC) 06/26/2014   Lumbar radiculitis 10/03/2013   Diverticulosis 08/24/2013    PCP:  Dr. Luke Shade     REFERRING PROVIDER: Dr. Reeves Daisy    REFERRING DIAG: [id number] (ICD-10-CM) - Chronic bilateral low back pain without sciatica  Rationale for Evaluation and Treatment: Rehabilitation  THERAPY DIAG:  Other low back pain  Imbalance  Difficulty in walking, not elsewhere classified  Muscle weakness (generalized)  ONSET DATE: 10/2022  SUBJECTIVE:                                                                                                                                                                                           SUBJECTIVE STATEMENT:   Pt reports that she recently saw doctor at pain clinic, who said she would be a good candidate for spinal cord stimulator.      PERTINENT HISTORY:  She has prior history of left sided stroke affecting her right side in February 2024. She also had a L3-L4 lumbar decompression surgery in June 2024 with complications due to a epidural hematoma that resulted in cauda equinda syndrome and her needing to return to ER for evacuation and she was in the hospital for an additional 8 days following surgery.  She is just moving back to [place] from  [place], because she has qualified for housing in [place]. She describes being most concerned with her balance especially when walking over uneven surfaces. She mainly uses a a 2WW when walking to places that she has to drive to because it is easier to transport. She has not had a fall in the past 6 mo, but she is scared of falling especially when walking over uneven surfaces. She does report an improvement in LE function with her now being able to raise her toes and that she was not able to do this after surgery.   PAIN:  Are you having pain? Yes: NPRS scale: 8/10 NRPS on central spinous process of L1-L5    Pain location: L1-L5 Central Spinous   Pain description: Dull   Aggravating factors: Tends to hurt more as the day goes on or the more active she is. Relieving factors: 5 x 325 Viacodin, Alieve  as needed     PRECAUTIONS: Fall; she is afraid of falling    RED FLAGS: None   WEIGHT BEARING RESTRICTIONS: No  FALLS:  Has patient fallen in last 6 months? No  LIVING ENVIRONMENT: Lives with: lives alone Lives in: House/apartment Stairs: Yes: External: 4 steps; on right going up and there is a ramp going to a house and there is a ramped main entrance leading to lobby. Place is called [employer]   Has following equipment at home: Single point cane, Environmental consultant - 2 wheeled,  Walker - 4 wheeled, shower chair, Grab bars, and Ramped entry  OCCUPATION: Retired   PLOF: Independent  PATIENT GOALS: Pt wants to be able to walk outside using single point cane and to feel more confidence when walking especially over uneven surfaces. She currently uses rollator to walk dog at home and 2WW when walking anywhere where she has to drive.   NEXT MD VISIT: October 2025     OBJECTIVE:  Note: Objective measures were completed at Evaluation unless otherwise noted.  VITALS BP 118/66 HR  60 Sp02  97%  DIAGNOSTIC FINDINGS:  CLINICAL DATA:  Low back pain. Cauda equina syndrome suspected. Cyst removal  from the spine two days ago. Postoperative urinary incontinence and bilateral leg numbness.   EXAM: MRI LUMBAR SPINE WITHOUT CONTRAST   TECHNIQUE: Multiplanar, multisequence MR imaging of the lumbar spine was performed. No intravenous contrast was administered.   COMPARISON:  05/18/2022   FINDINGS: Segmentation: 5 lumbar type vertebral bodies as numbered previously.   Alignment: Scoliotic curvature convex to the right as seen previously. No significant listhesis.   Vertebrae: Edematous change of the endplates at T10, U88 and T12, likely to relate to regional pain. This is probably degenerative. Lesser vertebral endplate edema noted at L3-4 and L4-5.   Conus medullaris and cauda equina: Conus extends to the L1 level. Conus and cauda equina appear normal.   Paraspinal and other soft tissues: Posterior surgical approach at the L4-5 level from the right-side has an expected appearance.   Disc levels:   At the surgical level and extending within the spinal canal from the upper L3 level to the L 4 5 disc level, there is a fluid collection which causes marked compression of the thecal sac. This is most severe at the level of the L3-4 disc where the fluid collection within the canal measures 1.8 x 1.5 cm in diameter and markedly flattens the thecal sac anteriorly.   Other than the surgical decompression of the synovial cyst on the right and the postoperative intraspinal fluid collection, there is no change since the preoperative exam, as one might expect.   IMPRESSION: 1. At the surgical level and extending from the upper L3 level to the L4-5 disc level, there is a postoperative fluid collection within the spinal canal which causes marked compression of the thecal sac anteriorly. This is most severe at the level of the L3-4 disc where the fluid collection within the canal measures 1.8 x 1.5 cm in diameter and markedly flattens the thecal sac anteriorly.     Electronically  Signed   By: Oneil Officer M.D.   On: 10/12/2022 16:20  PATIENT SURVEYS:  ABC scale: The Activities-Specific Balance Confidence (ABC) Scale 0% 10 20 30  40 50 60 70 80 90 100% No confidence<->completely confident  "How confident are you that you will not lose your balance or become unsteady when you . . .   Date tested 01/01/24  Walk around the house 80%  2. Walk up or down stairs 80%  3. Bend over and pick up a slipper from in front of a closet floor 60%  4. Reach for a small can off a shelf at eye level 90%  5. Stand on tip toes and reach for something above your head 0%  6. Stand on a chair and reach for something 40%  7. Sweep the floor 50%  8. Walk outside the house to a car parked in the driveway 90%  9. Get into or out of a  car 80%  10. Walk across a parking lot to the mall 80%  11. Walk up or down a ramp 80%  12. Walk in a crowded mall where people rapidly walk past you 60%  13. Are bumped into by people as you walk through the mall 40%  14. Step onto or off of an escalator while you are holding onto the railing 50%  15. Step onto or off an escalator while holding onto parcels such that you cannot hold onto the railing 30%  16. Walk outside on icy sidewalks 0%  Total: #/16  57% (910/1600)       COGNITION: Overall cognitive status: Within functional limits for tasks assessed     SENSATION: Light touch: Impaired electricity down her RLE   MUSCLE LENGTH: Hamstrings: Not tested   Debby test: Not tested    POSTURE: rounded shoulders  PALPATION: Not performed    LUMBAR ROM:   AROM eval  Flexion 80%  Extension 100%  Right lateral flexion 100%  Left lateral flexion 100%  Right rotation 100%  Left rotation 100%   (Blank rows = not tested)  LOWER EXTREMITY ROM:     Active  Right eval Left eval  Hip flexion    Hip extension    Hip abduction    Hip adduction    Hip internal rotation    Hip external rotation    Knee flexion    Knee extension    Ankle  dorsiflexion    Ankle plantarflexion    Ankle inversion    Ankle eversion     (Blank rows = not tested)  LOWER EXTREMITY MMT:    MMT Right eval Left eval  Hip flexion 4- 4  Hip extension    Hip abduction    Hip adduction    Hip internal rotation    Hip external rotation    Knee flexion 4 4  Knee extension 4 4  Ankle dorsiflexion 4 4  Ankle plantarflexion    Ankle inversion    Ankle eversion     (Blank rows = not tested)    FUNCTIONAL TESTS:  5 times sit to stand: 14 sec  30 seconds chair stand test: 11 reps   Timed up and go (TUG): 15.38 sec with use of single point cane   6 minute walk test: 553 ft  10 meter walk test: 0.61 m/sec                 DGI:  13/26                       <=19/24  predictive of falls in the elderly     GAIT: Distance walked: 30 ft    Assistive device utilized: Walker - 2 wheeled Level of assistance: Modified independence Comments: Significant use of BUE support with decrease step length bilaterally.    TREATMENT DATE:   02/19/24: Physical Performance   Dynamic Gait Index: Dynamic Gait Index - uses single point can in RUE throughout test                [place] the lowest level that applies.    Date Performed 02/19/24  Gait level surface (3) Normal: walks 20', no AD, good speed, no evidence for imbalance, normal gait pattern  2. Change in gait speed (2) Mild Impairment: Is able to change speed but demonstrates mild gait deviations, or not gait deviations but unable to achieve a significant change in velocity, or uses  an assistive device  3. Gait with horizontal head turns (2) Mild Impairment: Performs head turns smoothly with slight change in gait velocity, i.e., minor disruption to smooth gait path or uses walking aid  4. Gait with vertical head turns (3) Normal: Performs head turns smoothly with no change in gait  5. Gait and pivot turn (2) Mild Impairment: Pivot turns safely in > 3 seconds and stops with no loss of balance  6. Step over  obstacle (1) Moderate Impairment: Is able to step over box, but must stop to do it.    7. Step around obstacle (3) Normal: Is able to walk around cones safely without changing gait speed; no evidence of imbalance  8. Steps (2) Mild Impairment: Alternating feet, must use rail  Total score 15/24       Score Interpretation: Score of <19 indicates high risk of falls.   Minimally Clinically Important Difference (MCID):  =DGI scores of<21/24 = 1.80 points DGI scores of >21/24 = 0.60 points  NMR   Hip Abduction with bent knee to 45 deg on RLE  1 x 10   -Pt struggles to lift leg up  Hip Abuction with bent knee to 45 deg on RLE 1 x 10   -with pillow between legs to make less difficult, but still struggles to perform.  Clam Shell on RLE 3 x 20    Step taps on RLE on 6 inch step with BUE support using 2WW 1 x 10  Step taps on RLE on 6 inch step with 1 UE support  1 x 10   Step taps on RLE on 4 inch steop with 1 UE support 1 x 10  -min VC to keep toes pointed forward      PATIENT EDUCATION:  Education details: Form and technique for correct performance of exercise and explanation about cut-offs for functional tests.  Person educated: Patient Education method: Explanation, Demonstration, Verbal cues, and Handouts Education comprehension: verbalized understanding, returned demonstration, and verbal cues required  HOME EXERCISE PROGRAM: Access Code: [email or web address] URL: [email or web address] Date: 02/19/2024 Prepared by: Toribio Servant  Program Notes Toe to heel on right foot with mirror in front of you 3 sets  x 20 reps 5-7 days per week    Exercises - Seated Hamstring Stretch  - 1 x daily - 7 x weekly - 2-3 reps - 60 sec hold - Seated Hamstring Stretch (Mirrored)  - 1 x daily - 7 x weekly - 2-3 reps - 60 sec hold - Alternating Step Taps with Counter Support (Mirrored)  - 5-7 x weekly - 3 sets - 20 reps - Standing Hip Extension with Counter Support  - 2-3 x daily - 5-7 x weekly -  3 sets - 10 reps - Sit to Stand  - 3-4 x weekly - 3 sets - 10 reps - Standing Ankle Dorsiflexion with Table Support  - 5-7 x weekly - 3 sets - 20 reps - Seated Slump Neural Tensioner  - 1 x daily - 7 x weekly - 1 sets - 10 reps - Supine Quad Set (Mirrored)  - 1 x daily - 7 x weekly - 2 sets - 10 reps - 5 sec  hold - Beginner Clam (Mirrored)  - 1 x daily - 3-4 x weekly - 3 sets - 10 reps  CLINICAL IMPRESSION:   Pt shows improvement with dynamic balance with improvement in obstacle step over. She also demonstrates improved hip ER activation with ability to perform clam shells on RLE without  compensation and step taps while maintaining toes pointed forward.  She will continue to benefit from skilled PT to improve motor control and strength of lower extremities to improve dynamic balance and gait stability to avoid falling and to decrease level of assistive device to return to her prior level of function.    OBJECTIVE IMPAIRMENTS: Abnormal gait, decreased endurance, decreased ROM, decreased strength, impaired flexibility, impaired sensation, obesity, and pain.   ACTIVITY LIMITATIONS: carrying, lifting, bending, standing, squatting, stairs, dressing, hygiene/grooming, and locomotion level  PARTICIPATION LIMITATIONS: meal prep, cleaning, laundry, shopping, community activity, and yard work  PERSONAL FACTORS: Fitness, Past/current experiences, Time since onset of injury/illness/exacerbation, and 3+ comorbidities: h/o stroke with right sided deficits, L3-L5 decompression, HLD, HTN are also affecting patient's functional outcome.   REHAB POTENTIAL: Fair multiple co-morbidities and chronicity of condition  CLINICAL DECISION MAKING: Evolving/moderate complexity  EVALUATION COMPLEXITY: Moderate   GOALS: Goals reviewed with patient? No  SHORT TERM GOALS: Target date: 01/15/2024  Patient will demonstrate undestanding of home exercise plan by performing exercises correctly with evidence of good carry  over with min to no verbal or tactile cues .   Baseline: NT  01/16/24: Performing Independently  Goal status: ACHIEVED     2.   Pt will increase by at least 84m (134ft) in order to demonstrate clinically significant improvement in cardiopulmonary endurance and community ambulation Baseline: 8/28: 553' with RW and unable to complete 6 minutes (stopped at 5 minutes)   Goal status: NOT MET     3.  Patient will show decreased risk of falling as evidence by decrease in self-selected gait speed by >= 0.12 m/sec.  Baseline: 8/28: 0.61 m/s Goal status: NOT MET    4.  Pt will decrease 5TSTS by at least 3 seconds to demonstrate a minimal clinically significant improvement in LE strength.  Baseline: 14 seconds  Goal status: NOT MET     LONG TERM GOALS: Target date: 03/25/2024  Patient will improve activities specific balance scale score >=67% as evidence of an improvement self-perception of function and to decrease risk for falls Mae et al., 2022).  Baseline: 57%  02/14/24: 33% Goal status: ONGOING    2.  Patient will perform 5 x STS in <11.4 sec to demonstrate improve LE strength to decrease risk of falling (Bohannon, 2006). Baseline: 14 sec   02/05/24:  14 sec  Goal status: ONGOING    3.  Patient will complete >=12 reps on 30 sec chair stands to exhibit improved LE endurance to decrease risk of falling.  Baseline: 11 reps   02/05/24: 11 reps    Goal status: ONGOING    4.  Patient will ambulate >=1000 ft in to show community ambulator status to be able to have aerobic endurance to return to fulfilling walking tasks at his job.  Baseline: 8/28: 553' with RW and unable to complete 6 minutes (stopped at 5 minutes)  02/05/24: 558 ft with 2WW   Goal status: ONGOING    5.  Patient will demonstrate reduced falls risk as evidenced by Dynamic Gait Index (DGI) >19/24 to decrease risk of falling.  Baseline: 13/24 with use of single point cane in RUE   02/19/24: 15/24-  mild for most, moderate  for stairs and obstacle over.   Goal status: ONGOING     6.  Patient will perform TUG in <13.5 sec as evidence of improved mobility and decreased risk for falling [facility] et al., 2000) Baseline:  8/28: 15.38 seconds with RW  02/12/24:  14.47 sec with 2 WW   Goal status: ONGOING    7. Patient will be able to ambulate safely with the least restrictive assistive device outside as  evidenced by decreased lateral sway, consistent stride length and step length, and with knowledge of when to take a break due to fatigue.  Baseline: Using 2WW or 4WW Goal status: ONGOING     8. Patient will perform in >=1.0 m./sec as evidence of improved mobility, decreased falls risk, and overall functional independence.  Baseline: 8/28: 0.61 m/s  02/12/24: 0.73 m/sec    Goal status: ONGOING   PLAN:  PT FREQUENCY: 1-2x/week  PT DURATION: 12 weeks  PLANNED INTERVENTIONS: 97164- PT Re-evaluation, 97750- Physical Performance Testing, 97110-Therapeutic exercises, 97530- Therapeutic activity, 97112- Neuromuscular re-education, 97535- Self Care, 02859- Manual therapy, [phone]- Gait training, [phone]- Orthotic Initial, [phone]- Orthotic/Prosthetic subsequent, [phone]- Canalith repositioning, [phone]- Aquatic Therapy, [phone]- Electrical stimulation (unattended), [phone]- Electrical stimulation (manual), [id number]- Ultrasound, [id number] (1-2 muscles), [phone] (3+ muscles)- Dry Needling, Patient/Family education, Balance training, Stair training, Taping, Joint mobilization, Joint manipulation, Spinal manipulation, Spinal mobilization, Scar mobilization, Vestibular training, DME instructions, Cryotherapy, Moist heat, and Biofeedback.  PLAN FOR NEXT SESSION: Continue to focus on lateral hip activation on RLE.    Toribio Servant PT, DPT  [facility] [address], [place], [address] Phone: [phone]   Fax:  [phone]

## 2024-02-22 ENCOUNTER — Ambulatory Visit: Admitting: Physical Therapy

## 2024-02-22 ENCOUNTER — Encounter: Payer: Self-pay | Admitting: Physical Therapy

## 2024-02-22 DIAGNOSIS — R2689 Other abnormalities of gait and mobility: Secondary | ICD-10-CM

## 2024-02-22 DIAGNOSIS — R262 Difficulty in walking, not elsewhere classified: Secondary | ICD-10-CM

## 2024-02-22 DIAGNOSIS — M5459 Other low back pain: Secondary | ICD-10-CM | POA: Diagnosis not present

## 2024-02-22 NOTE — Therapy (Signed)
 OUTPATIENT PHYSICAL THERAPY THORACOLUMBAR AND BALANCE TREATMENT      Patient Name: Susan [name] MRN: [id number] DOB:[birth year], 63 y.o., female Today's Date: 02/22/2024  END OF SESSION:  PT End of Session - 02/22/24 1354     Visit Number 11    Number of Visits 24    Date for Recertification  03/25/24    Authorization Type [employer] Medicare Dual 2025    Authorization - Visit Number 11    Authorization - Number of Visits 24    Progress Note Due on Visit 20    PT Start Time 1345    PT Stop Time 1430    PT Time Calculation (min) 45 min    Activity Tolerance Patient tolerated treatment well    Behavior During Therapy [facility] for tasks assessed/performed               Past Medical History:  Diagnosis Date   ADD (attention deficit disorder)    Alcohol abuse    Anemia    Anxiety    Aortic atherosclerosis 02/20/2018   Chest CT Sept 2019   Arthritis    rheumatoid arthritis   Asthma    Bipolar disorder (HCC)    Centrilobular emphysema (HCC)    Chronic diarrhea 07/22/2022   Cocaine use disorder, moderate, in sustained remission (HCC)    Constipation    Degenerative disc disease at L5-S1 level 09/28/2016   See ortho note May 2018   Depression    bipolar, hx of suicide attempt   Diabetes mellitus, type 2 (HCC)    Diverticulitis 2013   DOE (dyspnea on exertion)    Drug use    Family history of adverse reaction to anesthesia    mom-delayed emergence   GERD (gastroesophageal reflux disease)    H/O suicide attempt    slit wrists   Hepatitis C 06/26/2014   treated with Harvoni   Hip pain    History of echocardiogram    a. 04/2019 Echo: EF >65%, nl RV fxn.   History of MRSA infection 2013   HLD (hyperlipidemia)    Hypertension    a. 06/2021 Renal Duplex: ? bilat RAS; b. 06/2021 CTA Abd: No signif RAS.   Hyponatremia    Hypothyroidism    Incisional hernia 11/08/2012   Knee pain    Left carotid bruit    Morbid obesity (HCC)    Multinodular thyroid     OSA (obstructive  sleep apnea)    OSA on CPAP    Osteoporosis    Palpitations    Post-traumatic osteoarthritis of right knee 09/24/2015   Recurrent ventral hernia 11/08/2012   Skin lesion 06/28/2022   Status post total hip replacement, left 07/23/2019   Status post total right knee replacement using cement 05/10/2016   Stroke (HCC) 07/05/2022   right arm,torso and right leg numbness   Tobacco use disorder    Tobacco use disorder, continuous 06/26/2014   Vitamin B12 deficiency    Vitamin D  deficiency 04/09/2015   Past Surgical History:  Procedure Laterality Date   BILATERAL SALPINGOOPHORECTOMY     due to abnormal mass   BREAST BIOPSY Left    neg   BREAST SURGERY Left 20 yrs ago   CESAREAN SECTION     COLONOSCOPY     COLONOSCOPY WITH PROPOFOL  N/A 10/16/2017   Procedure: COLONOSCOPY WITH PROPOFOL ;  Surgeon: Therisa Bi, MD;  Location: [facility] ENDOSCOPY;  Service: Gastroenterology;  Laterality: N/A;   COLONOSCOPY WITH PROPOFOL  N/A 02/16/2021   Procedure: COLONOSCOPY WITH  PROPOFOL ;  Surgeon: Therisa Bi, MD;  Location: [facility] ENDOSCOPY;  Service: Gastroenterology;  Laterality: N/A;   FORAMINOTOMY 1 LEVEL Right 10/10/2022   Procedure: RIGHT L4-5 LAMINOFORAMINOTOMY;  Surgeon: Clois Fret, MD;  Location: [facility] ORS;  Service: Neurosurgery;  Laterality: Right;   HERNIA REPAIR  06/2011, July 2014   Ventral wall repair with Physiomesh   HERNIA REPAIR     2nd.vental wall repair   JOINT REPLACEMENT Right    knee   LUMBAR LAMINECTOMY/ DECOMPRESSION WITH MET-RX N/A 10/12/2022   Procedure: LUMBAR LAMINECTOMY/ DECOMPRESSION WITH MET-RX;  Surgeon: Clois Fret, MD;  Location: [facility] ORS;  Service: Neurosurgery;  Laterality: N/A;   TONSILLECTOMY     TOTAL HIP ARTHROPLASTY Left 07/23/2019   Procedure: TOTAL HIP ARTHROPLASTY;  Surgeon: Edie Norleen PARAS, MD;  Location: [facility] ORS;  Service: Orthopedics;  Laterality: Left;   TOTAL KNEE ARTHROPLASTY Right 05/10/2016   Procedure: TOTAL KNEE ARTHROPLASTY;  Surgeon: Norleen PARAS Edie, MD;  Location: [facility] ORS;  Service: Orthopedics;  Laterality: Right;   TUBAL LIGATION     Patient Active Problem List   Diagnosis Date Noted   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   Chronic pain syndrome 02/15/2024   Urge incontinence 12/22/2023   Painful and cold lower extremity 12/22/2023   Hemiplegia and hemiparesis following cerebral infarction affecting right dominant side (HCC) 12/04/2023   CKD (chronic kidney disease) stage 3, GFR 30-59 ml/min (HCC) 12/04/2023   Dysuria 11/03/2023   History of stroke 04/17/2023   Right leg weakness 04/17/2023   Neurogenic bladder 02/13/2023   Abnormality of gait and mobility 02/13/2023   Morbid obesity with body mass index (BMI) of 40.0 to 44.9 in adult (HCC) 01/10/2023   Left thyroid  nodule 01/10/2023   Bipolar 1 disorder, depressed, moderate (HCC) 11/29/2022   Normocytic normochromic anemia 10/13/2022   Epidural hematoma (HCC) 10/12/2022   Mass in epidural space 10/12/2022   Synovial cyst of lumbar facet joint 10/10/2022   Chronic radicular lumbar pain 10/10/2022   S/P laminectomy 10/10/2022   Epigastric pain 07/22/2022   Enlarged thyroid  07/22/2022   CVA (cerebrovascular accident) (HCC) 07/05/2022   Type II diabetes mellitus with nephropathy (HCC) 06/16/2021   H/O thyroid  nodule 05/31/2021   History of hepatitis C- treated 2014- 2015 with [facility] gastroenterology and followed.  05/19/2021   Left carotid bruit 04/28/2021   Bipolar disorder, in full remission, most recent episode depressed 02/05/2021   Anxiety 05/28/2020   Bipolar affective disorder in remission 05/28/2020   Bipolar disorder, in full remission, most recent episode mixed 03/06/2020   Need for immunization against influenza 01/27/2020   Morbid obesity (HCC) 01/27/2020   Osteoporosis 11/06/2019   Bipolar 1 disorder, mixed, mild (HCC) 10/29/2019   PTSD (post-traumatic stress disorder) 09/27/2019   Bereavement 09/27/2019   Cocaine use disorder, moderate, in sustained  remission (HCC) 09/27/2019   Primary osteoarthritis of left hip 06/14/2019   Allergic rhinitis 08/23/2018   Aortic atherosclerosis 02/20/2018   Centrilobular emphysema (HCC) 02/20/2018   Hyperlipidemia 03/28/2017   Degenerative disc disease at L5-S1 level 09/28/2016   GERD (gastroesophageal reflux disease) 12/17/2015   Chronic back pain 10/09/2015   Post-traumatic osteoarthritis of right knee 09/24/2015   Alcohol use disorder, severe, in sustained remission (HCC) 04/13/2015   OSA on CPAP 04/09/2015   Subclinical hyperthyroidism 06/26/2014   Goiter colloid, toxic, nodular 06/26/2014   Hypertension 06/26/2014   Bipolar I disorder (HCC) 06/26/2014   Lumbar radiculitis 10/03/2013   Diverticulosis 08/24/2013    PCP: Dr. Kalpana  Bair     REFERRING PROVIDER: Dr. Reeves Daisy    REFERRING DIAG: M54.50,G89.29 (ICD-10-CM) - Chronic bilateral low back pain without sciatica  Rationale for Evaluation and Treatment: Rehabilitation  THERAPY DIAG:  Other low back pain  Imbalance  Difficulty in walking, not elsewhere classified  ONSET DATE: 10/2022  SUBJECTIVE:                                                                                                                                                                                           SUBJECTIVE STATEMENT:   Pt states that she is feeling increased pain localized to her right ischial tuberosity. She also describes being able to walk a short distance without a walker and not feeling unsteady. She is concerned about discoloration of her feet and she had a NP cardiologist rule out peripheral arterial disease. She describes that NP suggested it may be Raynaud's.      PERTINENT HISTORY:  She has prior history of left sided stroke affecting her right side in February 2024. She also had a L3-L4 lumbar decompression surgery in June 2024 with complications due to a epidural hematoma that resulted in cauda equinda syndrome and her needing  to return to ER for evacuation and she was in the hospital for an additional 8 days following surgery.  She is just moving back to [place] from [place], because she has qualified for housing in [place]. She describes being most concerned with her balance especially when walking over uneven surfaces. She mainly uses a a 2WW when walking to places that she has to drive to because it is easier to transport. She has not had a fall in the past 6 mo, but she is scared of falling especially when walking over uneven surfaces. She does report an improvement in LE function with her now being able to raise her toes and that she was not able to do this after surgery.   PAIN:  Are you having pain? Yes: NPRS scale: 5-6/10       Pain location: Right ischial tuberosity   Pain description: Dull and achy      Aggravating factors: Walking and performing hip abduction  Relieving factors: Using ice and keep still     PRECAUTIONS: Fall; she is afraid of falling    RED FLAGS: None   WEIGHT BEARING RESTRICTIONS: No  FALLS:  Has patient fallen in last 6 months? No  LIVING ENVIRONMENT: Lives with: lives alone Lives in: House/apartment Stairs: Yes: External: 4 steps; on right going up and there is a ramp going to a house and there is a ramped main entrance leading to  lobby. Place is called [employer]   Has following equipment at home: Single point cane, Environmental consultant - 2 wheeled, Environmental consultant - 4 wheeled, shower chair, Grab bars, and Ramped entry  OCCUPATION: Retired   PLOF: Independent  PATIENT GOALS: Pt wants to be able to walk outside using single point cane and to feel more confidence when walking especially over uneven surfaces. She currently uses rollator to walk dog at home and 2WW when walking anywhere where she has to drive.   NEXT MD VISIT: October 2025     OBJECTIVE:  Note: Objective measures were completed at Evaluation unless otherwise noted.  VITALS BP 118/66 HR  60 Sp02  97%  DIAGNOSTIC  FINDINGS:  CLINICAL DATA:  Low back pain. Cauda equina syndrome suspected. Cyst removal from the spine two days ago. Postoperative urinary incontinence and bilateral leg numbness.   EXAM: MRI LUMBAR SPINE WITHOUT CONTRAST   TECHNIQUE: Multiplanar, multisequence MR imaging of the lumbar spine was performed. No intravenous contrast was administered.   COMPARISON:  05/18/2022   FINDINGS: Segmentation: 5 lumbar type vertebral bodies as numbered previously.   Alignment: Scoliotic curvature convex to the right as seen previously. No significant listhesis.   Vertebrae: Edematous change of the endplates at T10, U88 and T12, likely to relate to regional pain. This is probably degenerative. Lesser vertebral endplate edema noted at L3-4 and L4-5.   Conus medullaris and cauda equina: Conus extends to the L1 level. Conus and cauda equina appear normal.   Paraspinal and other soft tissues: Posterior surgical approach at the L4-5 level from the right-side has an expected appearance.   Disc levels:   At the surgical level and extending within the spinal canal from the upper L3 level to the L 4 5 disc level, there is a fluid collection which causes marked compression of the thecal sac. This is most severe at the level of the L3-4 disc where the fluid collection within the canal measures 1.8 x 1.5 cm in diameter and markedly flattens the thecal sac anteriorly.   Other than the surgical decompression of the synovial cyst on the right and the postoperative intraspinal fluid collection, there is no change since the preoperative exam, as one might expect.   IMPRESSION: 1. At the surgical level and extending from the upper L3 level to the L4-5 disc level, there is a postoperative fluid collection within the spinal canal which causes marked compression of the thecal sac anteriorly. This is most severe at the level of the L3-4 disc where the fluid collection within the canal measures 1.8 x  1.5 cm in diameter and markedly flattens the thecal sac anteriorly.     Electronically Signed   By: Oneil Officer M.D.   On: 10/12/2022 16:20  PATIENT SURVEYS:  ABC scale: The Activities-Specific Balance Confidence (ABC) Scale 0% 10 20 30  40 50 60 70 80 90 100% No confidence<->completely confident  "How confident are you that you will not lose your balance or become unsteady when you . . .   Date tested 01/01/24  Walk around the house 80%  2. Walk up or down stairs 80%  3. Bend over and pick up a slipper from in front of a closet floor 60%  4. Reach for a small can off a shelf at eye level 90%  5. Stand on tip toes and reach for something above your head 0%  6. Stand on a chair and reach for something 40%  7. Sweep the floor 50%  8.  Walk outside the house to a car parked in the driveway 90%  9. Get into or out of a car 80%  10. Walk across a parking lot to the mall 80%  11. Walk up or down a ramp 80%  12. Walk in a crowded mall where people rapidly walk past you 60%  13. Are bumped into by people as you walk through the mall 40%  14. Step onto or off of an escalator while you are holding onto the railing 50%  15. Step onto or off an escalator while holding onto parcels such that you cannot hold onto the railing 30%  16. Walk outside on icy sidewalks 0%  Total: #/16  57% (910/1600)       COGNITION: Overall cognitive status: Within functional limits for tasks assessed     SENSATION: Light touch: Impaired electricity down her RLE   MUSCLE LENGTH: Hamstrings: Not tested   Debby test: Not tested    POSTURE: rounded shoulders  PALPATION: Not performed    LUMBAR ROM:   AROM eval  Flexion 80%  Extension 100%  Right lateral flexion 100%  Left lateral flexion 100%  Right rotation 100%  Left rotation 100%   (Blank rows = not tested)  LOWER EXTREMITY ROM:     Active  Right eval Left eval  Hip flexion    Hip extension    Hip abduction    Hip adduction    Hip  internal rotation    Hip external rotation    Knee flexion    Knee extension    Ankle dorsiflexion    Ankle plantarflexion    Ankle inversion    Ankle eversion     (Blank rows = not tested)  LOWER EXTREMITY MMT:    MMT Right eval Left eval  Hip flexion 4- 4  Hip extension    Hip abduction    Hip adduction    Hip internal rotation    Hip external rotation    Knee flexion 4 4  Knee extension 4 4  Ankle dorsiflexion 4 4  Ankle plantarflexion    Ankle inversion    Ankle eversion     (Blank rows = not tested)    FUNCTIONAL TESTS:  5 times sit to stand: 14 sec  30 seconds chair stand test: 11 reps   Timed up and go (TUG): 15.38 sec with use of single point cane   6 minute walk test: 553 ft  10 meter walk test: 0.61 m/sec                 DGI:  13/26                       <=19/24  predictive of falls in the elderly     GAIT: Distance walked: 30 ft    Assistive device utilized: Walker - 2 wheeled Level of assistance: Modified independence Comments: Significant use of BUE support with decrease step length bilaterally.    TREATMENT DATE:   02/21/24: THEREX   SLR on RLE- Patient feels increased right ischial tuberosity pain at 70 deg fleixon  Resisted right hamstring in sitting - + for right ischial tuberosity pain provocation  Seated HS isometric on RLE with 5 sec hold 1 x 10    -min VC to dig into ground with heel   -Pt reports not feeling muscle   Supine HS isomteric set on RLE with 5 sec hold 1 x 10  Hook Lying Clam Shell 3  x 10 with green band  Chart review of cardiology note on right foot numbness -ABI normal with peripheral vascular disease ruled out   -Did not mention Raynaud's but pt reports NP reporting that Raynaud's likely cause.  02/19/24: Physical Performance   Dynamic Gait Index: Dynamic Gait Index - uses single point can in RUE throughout test                Allen the lowest level that applies.    Date Performed 02/19/24  Gait level surface  (3) Normal: walks 20', no AD, good speed, no evidence for imbalance, normal gait pattern  2. Change in gait speed (2) Mild Impairment: Is able to change speed but demonstrates mild gait deviations, or not gait deviations but unable to achieve a significant change in velocity, or uses an assistive device  3. Gait with horizontal head turns (2) Mild Impairment: Performs head turns smoothly with slight change in gait velocity, i.e., minor disruption to smooth gait path or uses walking aid  4. Gait with vertical head turns (3) Normal: Performs head turns smoothly with no change in gait  5. Gait and pivot turn (2) Mild Impairment: Pivot turns safely in > 3 seconds and stops with no loss of balance  6. Step over obstacle (1) Moderate Impairment: Is able to step over box, but must stop to do it.    7. Step around obstacle (3) Normal: Is able to walk around cones safely without changing gait speed; no evidence of imbalance  8. Steps (2) Mild Impairment: Alternating feet, must use rail  Total score 15/24       Score Interpretation: Score of <19 indicates high risk of falls.   Minimally Clinically Important Difference (MCID):  =DGI scores of<21/24 = 1.80 points DGI scores of >21/24 = 0.60 points  NMR   Hip Abduction with bent knee to 45 deg on RLE  1 x 10   -Pt struggles to lift leg up  Hip Abuction with bent knee to 45 deg on RLE 1 x 10   -with pillow between legs to make less difficult, but still struggles to perform.  Clam Shell on RLE 3 x 20    Step taps on RLE on 6 inch step with BUE support using 2WW 1 x 10  Step taps on RLE on 6 inch step with 1 UE support  1 x 10   Step taps on RLE on 4 inch steop with 1 UE support 1 x 10  -min VC to keep toes pointed forward      PATIENT EDUCATION:  Education details: Form and technique for correct performance of exercise and explanation about cut-offs for functional tests.  Person educated: Patient Education method: Explanation, Demonstration, Verbal  cues, and Handouts Education comprehension: verbalized understanding, returned demonstration, and verbal cues required  HOME EXERCISE PROGRAM: Access Code: [email or web address] URL: [email or web address] Date: 02/22/2024 Prepared by: Toribio Servant  Program Notes Toe to heel on right foot with mirror in front of you 3 sets  x 20 reps 5-7 days per week    Exercises - Seated Hamstring Stretch  - 1 x daily - 7 x weekly - 2-3 reps - 60 sec hold - Seated Hamstring Stretch (Mirrored)  - 1 x daily - 7 x weekly - 2-3 reps - 60 sec hold - Alternating Step Taps with Counter Support (Mirrored)  - 5-7 x weekly - 3 sets - 20 reps - Standing Hip Extension with Counter Support  -  2-3 x daily - 5-7 x weekly - 3 sets - 10 reps - Sit to Stand  - 3-4 x weekly - 3 sets - 10 reps - Standing Ankle Dorsiflexion with Table Support  - 5-7 x weekly - 3 sets - 20 reps - Seated Slump Neural Tensioner  - 1 x daily - 7 x weekly - 1 sets - 10 reps - Supine Quad Set (Mirrored)  - 1 x daily - 7 x weekly - 2 sets - 10 reps - 5 sec  hold - Supine Bilateral Hamstring Sets  - 1 x daily - 7 x weekly - 2 sets - 10 reps - 5 sec  hold - Hooklying Single Leg Bent Knee Fallouts with Resistance  - 3-4 x weekly - 3 sets - 10 reps  CLINICAL IMPRESSION:   Pt limited by right hamstring pain which appears to be hamstring tendinitis with increased pain with increased hip flexion and with increased hamstring resistance that is localized of the right ischial tuberosity. PT focused on isometric strengthening exercises to relieve tendonitis. Educated pt on potential causes for her right foot numbness and discoloration with chart review ruling out vascular and to keep foot warm by wearing socks. Modified home exercise plan to exclude side lying clam shell and focus on supine clam shell to avoid aggravation of lateral hip pain.  She will continue to benefit from skilled PT to improve motor control and strength of lower extremities to improve  dynamic balance and gait stability to avoid falling and to decrease level of assistive device to return to her prior level of function.     OBJECTIVE IMPAIRMENTS: Abnormal gait, decreased endurance, decreased ROM, decreased strength, impaired flexibility, impaired sensation, obesity, and pain.   ACTIVITY LIMITATIONS: carrying, lifting, bending, standing, squatting, stairs, dressing, hygiene/grooming, and locomotion level  PARTICIPATION LIMITATIONS: meal prep, cleaning, laundry, shopping, community activity, and yard work  PERSONAL FACTORS: Fitness, Past/current experiences, Time since onset of injury/illness/exacerbation, and 3+ comorbidities: h/o stroke with right sided deficits, L3-L5 decompression, HLD, HTN are also affecting patient's functional outcome.   REHAB POTENTIAL: Fair multiple co-morbidities and chronicity of condition  CLINICAL DECISION MAKING: Evolving/moderate complexity  EVALUATION COMPLEXITY: Moderate   GOALS: Goals reviewed with patient? No  SHORT TERM GOALS: Target date: 01/15/2024  Patient will demonstrate undestanding of home exercise plan by performing exercises correctly with evidence of good carry over with min to no verbal or tactile cues .   Baseline: NT  01/16/24: Performing Independently  Goal status: ACHIEVED     2.   Pt will increase by at least 88m (142ft) in order to demonstrate clinically significant improvement in cardiopulmonary endurance and community ambulation Baseline: 8/28: 553' with RW and unable to complete 6 minutes (stopped at 5 minutes)   Goal status: NOT MET     3.  Patient will show decreased risk of falling as evidence by decrease in self-selected gait speed by >= 0.12 m/sec.  Baseline: 8/28: 0.61 m/s Goal status: NOT MET    4.  Pt will decrease 5TSTS by at least 3 seconds to demonstrate a minimal clinically significant improvement in LE strength.  Baseline: 14 seconds  Goal status: NOT MET     LONG TERM GOALS: Target date:  03/25/2024  Patient will improve activities specific balance scale score >=67% as evidence of an improvement self-perception of function and to decrease risk for falls Mae et al., 2022).  Baseline: 57%  02/14/24: 33% Goal status: ONGOING    2.  Patient will perform 5 x STS in <11.4 sec to demonstrate improve LE strength to decrease risk of falling (Bohannon, 2006). Baseline: 14 sec   02/05/24:  14 sec  Goal status: ONGOING    3.  Patient will complete >=12 reps on 30 sec chair stands to exhibit improved LE endurance to decrease risk of falling.  Baseline: 11 reps   02/05/24: 11 reps    Goal status: ONGOING    4.  Patient will ambulate >=1000 ft in to show community ambulator status to be able to have aerobic endurance to return to fulfilling walking tasks at his job.  Baseline: 8/28: 553' with RW and unable to complete 6 minutes (stopped at 5 minutes)  02/05/24: 558 ft with 2WW   Goal status: ONGOING    5.  Patient will demonstrate reduced falls risk as evidenced by Dynamic Gait Index (DGI) >19/24 to decrease risk of falling.  Baseline: 13/24 with use of single point cane in RUE   02/19/24: 15/24-  mild for most, moderate for stairs and obstacle over.   Goal status: ONGOING     6.  Patient will perform TUG in <13.5 sec as evidence of improved mobility and decreased risk for falling [facility] et al., 2000) Baseline:  8/28: 15.38 seconds with RW   02/12/24:  14.47 sec with 2 WW   Goal status: ONGOING    7. Patient will be able to ambulate safely with the least restrictive assistive device outside as  evidenced by decreased lateral sway, consistent stride length and step length, and with knowledge of when to take a break due to fatigue.  Baseline: Using 2WW or 4WW Goal status: ONGOING     8. Patient will perform in >=1.0 m./sec as evidence of improved mobility, decreased falls risk, and overall functional independence.  Baseline: 8/28: 0.61 m/s  02/12/24: 0.73 m/sec    Goal  status: ONGOING   PLAN:  PT FREQUENCY: 1-2x/week  PT DURATION: 12 weeks  PLANNED INTERVENTIONS: [id number]- PT Re-evaluation, 97750- Physical Performance Testing, 97110-Therapeutic exercises, 97530- Therapeutic activity, 97112- Neuromuscular re-education, 97535- Self Care, 02859- Manual therapy, [phone]- Gait training, [phone]- Orthotic Initial, [phone]- Orthotic/Prosthetic subsequent, [phone]- Canalith repositioning, [phone]- Aquatic Therapy, [phone]- Electrical stimulation (unattended), [phone]- Electrical stimulation (manual), [id number]- Ultrasound, [id number] (1-2 muscles), [phone] (3+ muscles)- Dry Needling, Patient/Family education, Balance training, Stair training, Taping, Joint mobilization, Joint manipulation, Spinal manipulation, Spinal mobilization, Scar mobilization, Vestibular training, DME instructions, Cryotherapy, Moist heat, and Biofeedback.  PLAN FOR NEXT SESSION: Gait training and balance without use of an assistive device.    Toribio Servant PT, DPT  [facility] [address] [place], [address] Phone: [phone]   Fax:  [phone]

## 2024-02-23 ENCOUNTER — Ambulatory Visit
Admission: RE | Admit: 2024-02-23 | Discharge: 2024-02-23 | Disposition: A | Discharge status: Home / Self Care | Source: Ambulatory Visit | Attending: Student in an Organized Health Care Education/Training Program | Admitting: Student in an Organized Health Care Education/Training Program

## 2024-02-23 DIAGNOSIS — M961 Postlaminectomy syndrome, not elsewhere classified: Secondary | ICD-10-CM | POA: Diagnosis not present

## 2024-02-23 DIAGNOSIS — G8929 Other chronic pain: Secondary | ICD-10-CM | POA: Diagnosis not present

## 2024-02-23 DIAGNOSIS — M4186 Other forms of scoliosis, lumbar region: Secondary | ICD-10-CM | POA: Diagnosis not present

## 2024-02-23 DIAGNOSIS — G894 Chronic pain syndrome: Secondary | ICD-10-CM | POA: Insufficient documentation

## 2024-02-23 DIAGNOSIS — M5116 Intervertebral disc disorders with radiculopathy, lumbar region: Secondary | ICD-10-CM | POA: Diagnosis not present

## 2024-02-23 DIAGNOSIS — M5416 Radiculopathy, lumbar region: Secondary | ICD-10-CM | POA: Insufficient documentation

## 2024-02-23 DIAGNOSIS — M48061 Spinal stenosis, lumbar region without neurogenic claudication: Secondary | ICD-10-CM | POA: Diagnosis not present

## 2024-02-26 ENCOUNTER — Encounter: Payer: Self-pay | Admitting: Physical Therapy

## 2024-02-26 ENCOUNTER — Ambulatory Visit: Admitting: Physical Therapy

## 2024-02-26 DIAGNOSIS — M5459 Other low back pain: Secondary | ICD-10-CM | POA: Diagnosis not present

## 2024-02-26 DIAGNOSIS — Z79899 Other long term (current) drug therapy: Secondary | ICD-10-CM | POA: Diagnosis not present

## 2024-02-26 DIAGNOSIS — R262 Difficulty in walking, not elsewhere classified: Secondary | ICD-10-CM

## 2024-02-26 DIAGNOSIS — M5416 Radiculopathy, lumbar region: Secondary | ICD-10-CM | POA: Diagnosis not present

## 2024-02-26 DIAGNOSIS — M5126 Other intervertebral disc displacement, lumbar region: Secondary | ICD-10-CM | POA: Diagnosis not present

## 2024-02-26 DIAGNOSIS — R2689 Other abnormalities of gait and mobility: Secondary | ICD-10-CM

## 2024-02-26 DIAGNOSIS — M48062 Spinal stenosis, lumbar region with neurogenic claudication: Secondary | ICD-10-CM | POA: Diagnosis not present

## 2024-02-26 NOTE — Progress Notes (Signed)
 Susan [name]                                          MRN: [id number]   02/26/2024   The VBCI Quality Team Specialist reviewed this patient medical record for the purposes of chart review for care gap closure. The following were reviewed: abstraction for care gap closure-glycemic status assessment.    VBCI Quality Team

## 2024-02-26 NOTE — Progress Notes (Signed)
 HPI The patient is a pleasant 63 year old female who presents today for follow-up of right sided low back pain with radiation into the right buttock, and lateral thigh.  She has associated numbness of the right foot.  Her pain is worsened with standing and walking.  She believes that altered walking mechanics have caused significantly increased back pain.  On 10/10/2022 she underwent right L4-5 laminoforaminotomy.  She was doing well, was discharged home, went to the bathroom and was unable to get off the toilet.  She was found to have cauda equina syndrome secondary to an epidural hematoma.  On 10/12/2022 she underwent L3-5 decompression and epidural hematoma evacuation.  Medications have included prednisone  (temporary relief), Aleve (bruising), Celebrex  (less effective), Mobic  (moderate relief), gabapentin , tramadol , Cymbalta , and Norco 5/325 daily as needed for breakthrough pain.  Physical therapy was not beneficial.  She subsequently had physical therapy at [facility] with Lupe Laud.  She quit smoking in December 2024.  In July 2025 Dr. Clois recommended an Art gallery manager.  She wanted to see how much she could progress in physical therapy prior to this.  She was last evaluated on 01/01/2024 at which time she continued with Tylenol  extra strength 2 tablets twice daily as needed, Mobic , Flexeril  5 mg (1 tablet at at bedtime), gabapentin  300mg  (twice daily), Cymbalta  and Norco 5/325 daily as needed breakthrough pain (40/month).  She had restarted physical therapy at [facility] rehab.  On 01/30/2024 she saw Dr. Clois and he commended spinal cord stimulator trial.  On 02/15/2024 she was evaluated by Dr. Marcelino.  He reviewed MRI of the thoracic spine and ordered an updated MRI of the lumbar spine.  On 02/07/2024 MRI of the thoracic spine was obtained. IMPRESSION:  1. Mild posterior disc bulging throughout the thoracic region without  significant spinal canal or neural foraminal stenosis. No spinal cord or  nerve  root impingement evident.  2. Slight degenerative anterolisthesis at T10-11 with Modic type 1 reactive  endplate changes. No disc space or paraspinous soft tissue edema at this level.  3. Slight degenerative anterolisthesis at T2-3 and T4-5.   Electronically signed by: Evalene Coho MD 02/10/2024 05:43 AM EDT  Today, she reports increased right sided low back pain greater than right leg pain that is worsened with standing and walking activities.  She feels that she is walking different after her last surgery.  She would like to undergo the spinal cord stimulator trial with Dr. Lazarus after he has been able to review her updated MRI of the lumbar spine.  A TENS unit was helpful in PT.  She may consider a home unit depending on the results of her spinal cord stimulator trial.  She is going to ask Dr. Marcelino if he would be willing to take over her pain medicine management.     Other history: On 2019/08/08 one of her sons died from a drug overdose.  His identical twin took this quite hard and also has an alcohol and drug problem.  She is worried that he may overdose as well.  She had left total hip replacement by Dr. Edie on 07/23/2019.  On 01/08/2021 she was evaluated by Gustavo for right hip and groin pain.  She was diagnosed with hip impingement but her pain had improved after a right L3-4 and right L5-S1 transforaminal ESI.  We have provided her with a permanent handicap parking sticker.  She is in the process of moving out of her house and moving into an elderly facility.  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  She received thrombolytic therapy and has had improvement of her strength and only persistent and right upper extremity numbness and paresthesias.  We provided her with a prescription for Narcan (12/27/2021).  She has a history of alcohol abuse and had been sober since January 04, 2015 and then relapsed in 2018.  She has been sober since May 2019.  She  continues to attend weekly meetings.  Klonopin  prior to her epidurals has been helpful for anxiety.    The patient has signed a Curator contract (08/28/23). The [place]  Narcotic Database was reviewed today. UDS T[employer]ure was obtained on 08/28/2023 (appropriate).  Procedures: 10/12/2022: L3-5 decompression and epidural hematoma evacuation (Dr. Clois) 10/10/2022: Right L4-5 laminoforaminotomy (Dr. Clois) 03/11/2022: Right hip joint injection (moderate relief) 01/11/2022: Right L3-4 and right L5-S1 transforaminal ESI (over 60% relief) 06/07/2021: Right trochanteric bursa injection (moderate relief) 02/09/2021: Right L3-4 and right L5-S1 transforaminal ESI (moderate to good relief) 01/06/2021: Right L4-5 and right L5-S1 transforaminal ESI (good relief x 3-4 days) 12/24/2020: Right L4 trigger point injection (mild relief) 07/14/2020: RFA to the bilateral L4-5 and L5-S1 facet joints (90% relief) 06/26/2020: MBB to the bilateral L4-5 and L5-S1 facet joints (7/10 to 0/10) 06/02/2020: MBB to the bilateral L4-5 and L5-S1 facet joints (10/10 to 0/10) 02/17/2020: Right L3 trigger point injection (moderate to good relief) 07/23/2019: Left hip replacement (Dr. Edie)  05/22/2019: Left L2-3 and left L4-5 transforaminal ESI (helped L4 pain, persistenet hip joint pain) 05/13/2019: Left hip joint injection under ultrasound (mild to moderate relief x 3 days) 02/28/2019: Left hip joint injection under ultrasound (moderate relief) 12/10/2018: Right subacromial injection (80% relief) 05/24/2018: Left hip joint injection under ultrasound (good relief) 03/12/2018: Left L2-3 transforaminal ESI (no relief) 02/01/2018: Left L5-S1 transforaminal ESI (relief x5 days) 12/05/2017: Bilateral L5-S1 transforaminal ESI (no relief) 09/13/2017: Right L3 trigger point injection (moderate relief) 02/15/2017: Bilateral L5-S1 transforaminal ESI (good relief for 2 weeks, then moderate sustained  relief) 09/23/2016: Bilateral L4-5 transforaminal ESI (mild to moderate relief) 12/17/2015: Bilateral L4-5 transforaminal ESI (good relief) 05/26/2015: Left L4-5 transforaminal ESI (good relief of left leg pain) 04/22/2015: Bilateral L5-S1 transforaminal ESI (relief of right leg pain, worsening of left leg pain but then in an L4 distribution) 08/27/2014: Right L4-5 transforaminal ESI (resolution of right L4 radicular symptoms) 07/16/2014: Right L3-4 transforaminal ESI (resolution of right L3 radicular symptoms) 10/25/2013: Left L4-5 transforaminal ESI (90% relief) 09/04/2013: Left L4-5 transforaminal ESI   Past Medical History:  Diagnosis Date  . Anxiety   . Arthritis   . Asthma without status asthmaticus (HHS-HCC)   . Chronic hepatitis C (CMS/HHS-HCC)   . COPD (chronic obstructive pulmonary disease) (CMS/HHS-HCC)   . Depression   . Diarrhea 07/22/2022  . Diverticulitis   . Diverticulosis   . GERD (gastroesophageal reflux disease)   . History of tonsillectomy   . Hypertension   . Hypothyroidism    Subclinical, with multinodular goiter  . MRSA (methicillin resistant Staphylococcus aureus)   . Osteoporosis, post-menopausal   . Sleep apnea   . Suicidal ideations    History of suicide attempts with hospitalizations.  Attempted to slit her wrists.   . Umbilical hernia   . Ventral hernia     Past Surgical History:  Procedure Laterality Date  . ovaries and fallopian tubes removed Bilateral 2013   removed due to abnormal mass  . Right TKA using all-cemented biomet Vangaurd system with a 67.55mm PCR femur, a  71 mm Tibial tray with a 14 mm AS E-poly insert and a 34 x 8.5 mm all-poly 3 pegged domed patella  Right 05/10/2016   Dr.Poggi   .  Left total hip arthroplasty Left 07/23/2019   Dr.Poggi   . COLONOSCOPY    . POLYPECTOMY    . REPAIR VENTRAL HERNIA LAPAROSCOPIC    . SUBCLINICAL HYPOTHYROIDISM WITH MULTINODULAR GOITER    . TONSILLECTOMY    . UMBILICAL HERNIA REPAIR       Social History   Socioeconomic History  . Marital status: Widowed  Tobacco Use  . Smoking status: Every Day    Current packs/day: 0.40    Average packs/day: 0.4 packs/day for 37.0 years (14.8 ttl pk-yrs)    Types: Cigarettes  . Smokeless tobacco: Never  . Tobacco comments:    about 8 cigs/day  Vaping Use  . Vaping status: Never Used  Substance and Sexual Activity  . Alcohol use: No    Comment: 102 days sober as of today.  . Drug use: No  . Sexual activity: Not Currently  Social History Narrative   Educationhs   Occupation: disabled   Hobbies: reading   Marital Status: separated   Social Drivers of Corporate investment banker Strain: Low Risk  (03/29/2023)   Received from [facility]   Overall Financial Resource Strain (CARDIA)   . Difficulty of Paying Living Expenses: Not hard at all  Food Insecurity: No Food Insecurity (03/29/2023)   Received from [facility]   Hunger Vital Sign   . Within the past 12 months, you worried that your food would run out before you got the money to buy more.: Never true   . Within the past 12 months, the food you bought just didn't last and you didn't have money to get more.: Never true  Transportation Needs: No Transportation Needs (03/29/2023)   Received from [facility]   . Lack of Transportation (Medical): No   . Lack of Transportation (Non-Medical): No    Current Outpatient Medications on File Prior to Visit  Medication Sig Dispense Refill  . acetaminophen  (TYLENOL ) 500 MG tablet Take 1,000 mg by mouth 2 (two) times daily as needed for Pain    . amLODIPine  (NORVASC ) 5 MG tablet Take 5 mg by mouth once daily    . aspirin  81 MG EC tablet Take 81 mg by mouth once daily    . celecoxib  (CELEBREX ) 100 MG capsule Take 100 mg by mouth 2 (two) times daily    . clindamycin  (CLEOCIN  T) 1 % topical solution Apply to AA scalp QD as needed    . cyanocobalamin  (VITAMIN B12) 100 MCG tablet Take 100 mcg by mouth  every morning before breakfast (0630)    . cyclobenzaprine  (FLEXERIL ) 5 MG tablet TAKE ONE-HALF TO 1 TABLET BY MOUTH TWICE DAILY AS NEEDED 60 tablet 10  . DULoxetine  (CYMBALTA ) 20 MG DR capsule Take 20 mg by mouth as directed TAKE 1 CAPSULE BY MOUTH EVERY DAY WITH 30 MG CAPSULE    . DULoxetine  (CYMBALTA ) 30 MG DR capsule Take 30 mg by mouth as directed Take 1 capsule (30 mg total) by mouth daily. Take daily with 20 mg cap.    . ergocalciferol , vitamin D2, 1,250 mcg (50,000 unit) capsule Take 50,000 Units by mouth once a week    . gabapentin  (NEURONTIN ) 300 MG capsule Take 2 capsules (600 mg total) by mouth 2 (two) times daily 360 capsule 3  . [START  ON 03/12/2024] HYDROcodone -acetaminophen  (NORCO) 5-325 mg tablet TAKE 1 TO 1 AND 1/2 TABLETS BY MOUTH EVERY DAY AS NEEDED 40 tablet 0  . HYDROcodone -acetaminophen  (NORCO) 5-325 mg tablet TAKE 1 TO 1 AND 1/2 TABLETS BY MOUTH EVERY DAY AS NEEDED 40 tablet 0  . HYDROcodone -acetaminophen  (NORCO) 5-325 mg tablet TAKE 1 TO 1 AND 1/2 TABLETS BY MOUTH EVERY DAY AS NEEDED 40 tablet 0  . lurasidone  (LATUDA ) 20 mg tablet Take 20 mg by mouth daily with dinner    . meloxicam  (MOBIC ) 15 MG tablet Take 1 tablet (15 mg total) by mouth once daily 30 tablet 11  . naloxone (NARCAN) 4 mg/actuation nasal spray Place into one nostril    . OZEMPIC  0.25 mg or 0.5 mg(2 mg/1.5 mL) pen injector Inject 1 mg subcutaneously once a week    . pantoprazole  (PROTONIX ) 40 MG DR tablet Take 40 mg by mouth once daily    . rosuvastatin  (CRESTOR ) 20 MG tablet Take 20 mg by mouth once daily    . spironolactone  (ALDACTONE ) 25 MG tablet Take 1 tablet by mouth once daily    . telmisartan  (MICARDIS ) 80 MG tablet Take by mouth    . traZODone  (DESYREL ) 50 MG tablet Take 1 tablet by mouth at bedtime    . VENTOLIN  HFA 90 mcg/actuation inhaler Inhale 2 inhalations into the lungs    . vibegron  (GEMTESA ) 75 mg Tab Take 75 mg by mouth once daily    . carvediloL  (COREG ) 12.5 MG tablet Take 12.5 mg by  mouth 2 (two) times daily (Patient not taking: Reported on 02/26/2024)    . carvediloL  (COREG ) 25 MG tablet  (Patient not taking: Reported on 02/26/2024)     No current facility-administered medications on file prior to visit.    Allergies as of 02/26/2024 - Reviewed 02/26/2024  Allergen Reaction Noted  . Bupropion  Other (See Comments) 03/18/2020  . Furosemide Other (See Comments) 05/05/2014  . Hydrochlorothiazide  Dizziness 08/24/2013    ROS More than 10 system, review of system form was given to the patient to fill out and has been signed by Dr. Avanell and scanned into the patient's chart.   Vitals Vitals:   02/26/24 0801  BP: 136/75  Pulse: 86  Temp: 36.1 C (97 F)  TempSrc: Oral  Weight: 98.9 kg (218 lb 0.6 oz)  Height: 157.5 cm (5' 2.01)  PainSc:   8  PainLoc: Back    Physical exam  Cervical exam (performed 12/10/2018) On inspection no rash is noted.  On palpation she has minimal tenderness of the cervical paraspinal musculature.  She displays full range of motion of the cervical spine in all planes without discomfort.  Spurling's maneuver to the right produces minimal paresthesias to the right trapezius ridge.  Spurling's maneuver to the left produces mild to moderate paresthesias into the left upper extremity.  Upper extremity exam She has 5/5 strength of bilateral wrist extensors, biceps, triceps, deltoids, shoulder internal rotators, and shoulder external rotators.  She has reproduction of her right brachial pain with resisted right shoulder external rotation.  Sensation light touch is intact throughout bilateral upper extremities.  Hoffmann sign is negative bilaterally.  Range of motion of the left shoulder is full and without pain.  She has mild restricted right shoulder abduction, internal, and external rotation.  She has a positive right-sided Vonzell Berber and empty can test.  Left shoulder exam is negative.  Lumbar exam (performed 12/24/20) On inspection no scar  is noted.  She has mild tenderness of  the lumbosacral paraspinal musculature.  Lumbar extension with rotation produces mild lumbosacral pain.  Lower extremity exam She has 5/5 strength of bilateral dorsiflexors, knee extensors, and hip flexors.  Sensation light touch is intact throughout bilateral lower extremities.  She has an absent right patellar reflex (knee replacement) and a 2+ left patellar reflex.  She has symmetrical 1+ Achilles reflexes.  She does not have a Babinski sign or ankle clonus.  Straight leg raise is negative bilaterally.  Range of motion of the hip joints does not produce pain.  She has mild tenderness of the right greater trochanter.  The left greater trochanter is nontender.   Radiological data MRI lumbar spine from [facility] dated 10/12/2022 both images and report were reviewed today.  IMPRESSION:  1. At the surgical level and extending from the upper L3 level to  the L4-5 disc level, there is a postoperative fluid collection  within the spinal canal which causes marked compression of the  thecal sac anteriorly. This is most severe at the level of the L3-4  disc where the fluid collection within the canal measures 1.8 x 1.5  cm in diameter and markedly flattens the thecal sac anteriorly.   Electronically Signed    By: Oneil Officer M.D.    On: 10/12/2022 16:20   MRI of the lumbar spine without contrast from [facility] dated 05/18/2022 both images and report were reviewed. She has had some advancement of the degenerative changes at L3-4, L4-5, and L5-S1. The most impressive finding occurs at L4-5 where there is a lesion in the right lateral recess that appears to be a new facet joint effusion that projects into the spinal canal and contributes to severe central stenosis. An element of a disc herniation cannot be ruled out.  COMPARISON:  MRI of lumbar spine without contrast 04/21/2020  FINDINGS:  Segmentation: 5 non rib-bearing lumbar type vertebral bodies are  present. The lowest  fully formed vertebral body is L5.  Alignment: Slight retrolisthesis is present at L2-3 and L3-4. Lumbar  lordosis is preserved. Rightward curvature centered at L3 is similar  the prior exam.  Vertebrae: Mixed fatty edematous changes present at L3-4, worse on  the left at L4-5, worse on the right. The edematous component is  consistent with progressive endplate degenerative change. More  chronic fatty endplate changes at T12-L1 L1-2 and L2-3 are stable.  Chronic fatty endplate marrow changes on the right at L5-S1 are also  stable.  Conus medullaris and cauda equina: Conus extends to the T12-L1  level. Conus and cauda equina appear normal.  Paraspinal and other soft tissues: Limited imaging the abdomen is  unremarkable. There is no significant adenopathy. No solid organ  lesions are present. Benign renal cysts were worked up by abdominal  MRI and unchanged. No follow-up imaging is recommended.   Disc levels:  T12-L1: Mild disc bulging and small extrusion are stable. No  significant stenosis or change is present.   L1-2: A leftward disc protrusion and small left paramedian inferior  extrusion are stable. Mild left subarticular and foraminal narrowing  are stable.   L2-3: A broad-based disc protrusion is present. Facet hypertrophy is  worse on the left. No significant change in mild left subarticular  narrowing. Foramina are patent.   L3-4: A broad-based disc protrusion is present. Moderate facet  hypertrophy is noted. Prominent epidural fat is present. Mild  foraminal stenosis is present bilaterally.   L4-5: Moderate bilateral facet hypertrophy is present. A  heterogeneous lesion is present within  the right lateral recess  extending superiorly extends 18 mm cephalo caudad. The maximal  cross-sectional imaging is 13 x 14 mm. This may emanate from the  right facet. There is significant progression of bilateral facet  hypertrophy and a new effusion in the right facet. Additionally, a   broad-based disc protrusion is again noted. Prominent epidural fat  present. The combination results in severe clustering of the nerve  roots centrally. Moderate bilateral foraminal stenosis has  progressed.   L5-S1: Progressive advanced bilateral facet hypertrophy is present,  right greater than left. Right facet effusion is present. Mild right  foraminal stenosis is present. The central canal is patent.   IMPRESSION:  1. Progressive advanced bilateral facet hypertrophy at L4-5 with a  new right-sided facet effusion.  2. A heterogeneous lesion arising from the right facet at L4-5  extends superiorly 18 mm and likely represents a synovial cyst  contributing to moderate to severe central canal and right lateral  recess narrowing. I  3. Progressive moderate bilateral foraminal stenosis at L4-5.  4. Progressive advanced bilateral facet hypertrophy at L5-S1, right  greater than left.  5. Mild right foraminal stenosis at L5-S1.  6. Mild left subarticular and foraminal narrowing at L1-2 and L2-3  is stable.  7. Mild foraminal narrowing bilaterally at L3-4.   Electronically Signed    By: Lonni Necessary M.D.    On: 05/18/2022 10:15    Impression 1. Acute on chronic low back pain with radiation to the right buttock, anterior lateral thigh, anterior and lateral lower leg, great toe.  Clinically she has symptoms consistent with an L4 greater than L5 polyradiculitis.  She may also have an underlying component of degenerative joint disease of the right hip.  2. Neck pain with radiation into the bilateral shoulders.  She also has pain in the right parascapular region, right forearm, and at times localizing to the third digit.  She has some symptoms consistent with a cervical radiculitis and some symptoms consistent with impingement syndrome.  MRI from [facility] dated 03/17/2019 demonstrates at C2-3, the disc remains well hydrated.  There are severe left facet degenerative changes.  There is no  central or foraminal stenosis.  At C3-4 there is a broad-based disc osteophyte complex with bilateral uncovertebral degenerative changes.  There is moderate right foraminal stenosis and severe left foraminal stenosis.  There is no central stenosis.  At C4-5 there are bilateral uncovertebral degenerative changes with moderate to severe right facet arthropathy.  There is moderate right foraminal stenosis.  There is no left foraminal stenosis or central stenosis.  At C5-6 there is left greater than right uncovertebral degenerative changes.  There is severe left foraminal stenosis and mild to moderate right foraminal stenosis.  There is no central stenosis.  At C6-7 the disc remains well hydrated without significant central or foraminal stenosis.  The C7-T1 level is unremarkable.  X-rays dated 03/11/2019 demonstrate severe degenerative disc disease is noted at C3-4.  At C4-5 there is a greater than 3 mm anterior listhesis.  This slightly increases with flexion.  C5-6 there is mild to moderate degenerative disc disease.  Bilateral facet arthropathy is noted particularly at C4-5.  There is concern for left foraminal narrowing but poorly evaluated with this oblique.  X-rays of the bilateral shoulders dated 03/11/2019 demonstrate mild degenerative changes of the AC joints.  The glenohumeral joints appear well preserved bilaterally.  3.  Right trochanteric bursitis. 4.  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  5.  Chronic hepatitis C, resolved after treatment.  6.  Depression/anxiety with a history of suicide attempt.  7.  Hypertension, diverticulosis, hypothyroidism.   8.  History of Alcohol abuse.     Plan 1.  Continue Tylenol  extra strength 2 tablets twice daily as needed 2.  Continue Mobic  15 mg daily, #30, 11 refills. 3.  Continue Flexeril  5 mg 1-2 qHS prn, #60, 11 refills. 4.  Continue gabapentin  300 mg 2 capsules twice daily.  5.  Continue Cymbalta  60 mg daily as  previously prescribed.  6.  She received a prescription for Narcan (12/27/2021). 7.  Continue Norco 5/325 1 to 1.5 tablets daily as needed breakthrough pain, #40 tablets.  Do not fill until 03/12/2024.  We have reviewed up regulation of pain receptors. 8.  She was sober since August 2016 until she had a relapse in May 2019.  She has been sober since May 2019.  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 9.  Continue PT. 10.  Permanent handicap sticker. 11.  Updated MRI of the lumbar spine is pending.  We will request [employer]. 12.  She has a spinal cord stimulator trial likely pending with Dr. Marcelino. 13.  She will follow-up with me in 6 weeks.    BENJAMIN CHARLES CHASNIS, DO  This note was generated in part with voice recognition software and I apologize for any typographical errors that were not detected and corrected.  PCP:  [employer]

## 2024-02-26 NOTE — Therapy (Signed)
 OUTPATIENT PHYSICAL THERAPY THORACOLUMBAR AND BALANCE TREATMENT      Patient Name: Susan [name] MRN: [id number] DOB:[birth year], 63 y.o., female Today's Date: 02/26/2024  END OF SESSION:  PT End of Session - 02/26/24 1521     Visit Number 12    Number of Visits 24    Date for Recertification  03/25/24    Authorization Type [facility] Medicare Dual 2025    Authorization - Visit Number 12    Authorization - Number of Visits 24    Progress Note Due on Visit 20    PT Start Time 1515    PT Stop Time 1600    PT Time Calculation (min) 45 min    Activity Tolerance Patient tolerated treatment well    Behavior During Therapy [facility] for tasks assessed/performed                Past Medical History:  Diagnosis Date   ADD (attention deficit disorder)    Alcohol abuse    Anemia    Anxiety    Aortic atherosclerosis 02/20/2018   Chest CT Sept 2019   Arthritis    rheumatoid arthritis   Asthma    Bipolar disorder (HCC)    Centrilobular emphysema (HCC)    Chronic diarrhea 07/22/2022   Cocaine use disorder, moderate, in sustained remission (HCC)    Constipation    Degenerative disc disease at L5-S1 level 09/28/2016   See ortho note May 2018   Depression    bipolar, hx of suicide attempt   Diabetes mellitus, type 2 (HCC)    Diverticulitis 2013   DOE (dyspnea on exertion)    Drug use    Family history of adverse reaction to anesthesia    mom-delayed emergence   GERD (gastroesophageal reflux disease)    H/O suicide attempt    slit wrists   Hepatitis C 06/26/2014   treated with Harvoni   Hip pain    History of echocardiogram    a. 04/2019 Echo: EF >65%, nl RV fxn.   History of MRSA infection 2013   HLD (hyperlipidemia)    Hypertension    a. 06/2021 Renal Duplex: ? bilat RAS; b. 06/2021 CTA Abd: No signif RAS.   Hyponatremia    Hypothyroidism    Incisional hernia 11/08/2012   Knee pain    Left carotid bruit    Morbid obesity (HCC)    Multinodular thyroid     OSA (obstructive  sleep apnea)    OSA on CPAP    Osteoporosis    Palpitations    Post-traumatic osteoarthritis of right knee 09/24/2015   Recurrent ventral hernia 11/08/2012   Skin lesion 06/28/2022   Status post total hip replacement, left 07/23/2019   Status post total right knee replacement using cement 05/10/2016   Stroke (HCC) 07/05/2022   right arm,torso and right leg numbness   Tobacco use disorder    Tobacco use disorder, continuous 06/26/2014   Vitamin B12 deficiency    Vitamin D  deficiency 04/09/2015   Past Surgical History:  Procedure Laterality Date   BILATERAL SALPINGOOPHORECTOMY     due to abnormal mass   BREAST BIOPSY Left    neg   BREAST SURGERY Left 20 yrs ago   CESAREAN SECTION     COLONOSCOPY     COLONOSCOPY WITH PROPOFOL  N/A 10/16/2017   Procedure: COLONOSCOPY WITH PROPOFOL ;  Surgeon: Therisa Bi, MD;  Location: [facility] ENDOSCOPY;  Service: Gastroenterology;  Laterality: N/A;   COLONOSCOPY WITH PROPOFOL  N/A 02/16/2021   Procedure: COLONOSCOPY  WITH PROPOFOL ;  Surgeon: Therisa Bi, MD;  Location: [facility] ENDOSCOPY;  Service: Gastroenterology;  Laterality: N/A;   FORAMINOTOMY 1 LEVEL Right 10/10/2022   Procedure: RIGHT L4-5 LAMINOFORAMINOTOMY;  Surgeon: Clois Fret, MD;  Location: [facility] ORS;  Service: Neurosurgery;  Laterality: Right;   HERNIA REPAIR  06/2011, July 2014   Ventral wall repair with Physiomesh   HERNIA REPAIR     2nd.vental wall repair   JOINT REPLACEMENT Right    knee   LUMBAR LAMINECTOMY/ DECOMPRESSION WITH MET-RX N/A 10/12/2022   Procedure: LUMBAR LAMINECTOMY/ DECOMPRESSION WITH MET-RX;  Surgeon: Clois Fret, MD;  Location: [facility] ORS;  Service: Neurosurgery;  Laterality: N/A;   TONSILLECTOMY     TOTAL HIP ARTHROPLASTY Left 07/23/2019   Procedure: TOTAL HIP ARTHROPLASTY;  Surgeon: Edie Norleen PARAS, MD;  Location: [facility] ORS;  Service: Orthopedics;  Laterality: Left;   TOTAL KNEE ARTHROPLASTY Right 05/10/2016   Procedure: TOTAL KNEE ARTHROPLASTY;  Surgeon: Norleen PARAS Edie, MD;  Location: [facility] ORS;  Service: Orthopedics;  Laterality: Right;   TUBAL LIGATION     Patient Active Problem List   Diagnosis Date Noted   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   Chronic pain syndrome 02/15/2024   Urge incontinence 12/22/2023   Painful and cold lower extremity 12/22/2023   Hemiplegia and hemiparesis following cerebral infarction affecting right dominant side (HCC) 12/04/2023   CKD (chronic kidney disease) stage 3, GFR 30-59 ml/min (HCC) 12/04/2023   Dysuria 11/03/2023   History of stroke 04/17/2023   Right leg weakness 04/17/2023   Neurogenic bladder 02/13/2023   Abnormality of gait and mobility 02/13/2023   Morbid obesity with body mass index (BMI) of 40.0 to 44.9 in adult (HCC) 01/10/2023   Left thyroid  nodule 01/10/2023   Bipolar 1 disorder, depressed, moderate (HCC) 11/29/2022   Normocytic normochromic anemia 10/13/2022   Epidural hematoma (HCC) 10/12/2022   Mass in epidural space 10/12/2022   Synovial cyst of lumbar facet joint 10/10/2022   Chronic radicular lumbar pain 10/10/2022   S/P laminectomy 10/10/2022   Epigastric pain 07/22/2022   Enlarged thyroid  07/22/2022   CVA (cerebrovascular accident) (HCC) 07/05/2022   Type II diabetes mellitus with nephropathy (HCC) 06/16/2021   H/O thyroid  nodule 05/31/2021   History of hepatitis C- treated 2014- 2015 with [facility] gastroenterology and followed.  05/19/2021   Left carotid bruit 04/28/2021   Bipolar disorder, in full remission, most recent episode depressed 02/05/2021   Anxiety 05/28/2020   Bipolar affective disorder in remission 05/28/2020   Bipolar disorder, in full remission, most recent episode mixed 03/06/2020   Need for immunization against influenza 01/27/2020   Morbid obesity (HCC) 01/27/2020   Osteoporosis 11/06/2019   Bipolar 1 disorder, mixed, mild (HCC) 10/29/2019   PTSD (post-traumatic stress disorder) 09/27/2019   Bereavement 09/27/2019   Cocaine use disorder, moderate, in sustained  remission (HCC) 09/27/2019   Primary osteoarthritis of left hip 06/14/2019   Allergic rhinitis 08/23/2018   Aortic atherosclerosis 02/20/2018   Centrilobular emphysema (HCC) 02/20/2018   Hyperlipidemia 03/28/2017   Degenerative disc disease at L5-S1 level 09/28/2016   GERD (gastroesophageal reflux disease) 12/17/2015   Chronic back pain 10/09/2015   Post-traumatic osteoarthritis of right knee 09/24/2015   Alcohol use disorder, severe, in sustained remission (HCC) 04/13/2015   OSA on CPAP 04/09/2015   Subclinical hyperthyroidism 06/26/2014   Goiter colloid, toxic, nodular 06/26/2014   Hypertension 06/26/2014   Bipolar I disorder (HCC) 06/26/2014   Lumbar radiculitis 10/03/2013   Diverticulosis 08/24/2013    PCP: Dr.  Luke Shade     REFERRING PROVIDER: Dr. Reeves Daisy    REFERRING DIAG: [phone] (ICD-10-CM) - Chronic bilateral low back pain without sciatica  Rationale for Evaluation and Treatment: Rehabilitation  THERAPY DIAG:  Other low back pain  Imbalance  Difficulty in walking, not elsewhere classified  ONSET DATE: 10/2022  SUBJECTIVE:                                                                                                                                                                                           SUBJECTIVE STATEMENT:   Pt reports that she is feeling increased low back today and she is experiencing more in her left side today than her right side.    PERTINENT HISTORY:  She has prior history of left sided stroke affecting her right side in February 2024. She also had a L3-L4 lumbar decompression surgery in June 2024 with complications due to a epidural hematoma that resulted in cauda equinda syndrome and her needing to return to ER for evacuation and she was in the hospital for an additional 8 days following surgery.  She is just moving back to [place] from [place], because she has qualified for housing in [place]. She describes  being most concerned with her balance especially when walking over uneven surfaces. She mainly uses a a 2WW when walking to places that she has to drive to because it is easier to transport. She has not had a fall in the past 6 mo, but she is scared of falling especially when walking over uneven surfaces. She does report an improvement in LE function with her now being able to raise her toes and that she was not able to do this after surgery.   PAIN:  Are you having pain? Yes: NPRS scale: 8/10       Pain location: Right ischial tuberosity   Pain description: Dull and achy      Aggravating factors: Walking and performing hip abduction  Relieving factors: Using ice and keep still     PRECAUTIONS: Fall; she is afraid of falling    RED FLAGS: None   WEIGHT BEARING RESTRICTIONS: No  FALLS:  Has patient fallen in last 6 months? No  LIVING ENVIRONMENT: Lives with: lives alone Lives in: House/apartment Stairs: Yes: External: 4 steps; on right going up and there is a ramp going to a house and there is a ramped main entrance leading to lobby. Place is called [employer]   Has following equipment at home: Single point cane, Environmental consultant - 2 wheeled, Environmental consultant - 4 wheeled, shower chair, Grab bars, and Ramped entry  OCCUPATION: Retired  PLOF: Independent  PATIENT GOALS: Pt wants to be able to walk outside using single point cane and to feel more confidence when walking especially over uneven surfaces. She currently uses rollator to walk dog at home and 2WW when walking anywhere where she has to drive.   NEXT MD VISIT: October 2025     OBJECTIVE:  Note: Objective measures were completed at Evaluation unless otherwise noted.  VITALS BP 118/66 HR  60 Sp02  97%  DIAGNOSTIC FINDINGS:   EXAM: MRI LUMBAR SPINE 02/23/2024  IMPRESSION: 1. No compressive central canal stenosis or acute neural impingement. 2. Moderate left lateral recess stenosis at L2-3 without definite neural compression. 3.  Postoperative changes at L3-4 without recurrent canal stenosis; left foraminal narrowing that could affect the L3 nerve. Resolution of the previously seen posterior extradural fluid collection. 4. Scoliotic curvature convex to the right with apex at L2-3, similar to the prior exam. 5. Mild edematous endplate changes at L2-3 and L3-4, which may contribute to regional back pain. 6. No anatomic limitation anticipated for spinal cord stimulator placement at T11-12 or T12-L1.   Electronically signed by: Oneil Officer MD 02/26/2024 09:46 AM EDT RP Workstation: [id number]  /////////////////////////////////////////////////////////////////////////////////////////////////////////////////// CLINICAL DATA:  Low back pain. Cauda equina syndrome suspected. Cyst removal from the spine two days ago. Postoperative urinary incontinence and bilateral leg numbness.   EXAM: MRI LUMBAR SPINE WITHOUT CONTRAST   TECHNIQUE: Multiplanar, multisequence MR imaging of the lumbar spine was performed. No intravenous contrast was administered.   COMPARISON:  05/18/2022   FINDINGS: Segmentation: 5 lumbar type vertebral bodies as numbered previously.   Alignment: Scoliotic curvature convex to the right as seen previously. No significant listhesis.   Vertebrae: Edematous change of the endplates at T10, U88 and T12, likely to relate to regional pain. This is probably degenerative. Lesser vertebral endplate edema noted at L3-4 and L4-5.   Conus medullaris and cauda equina: Conus extends to the L1 level. Conus and cauda equina appear normal.   Paraspinal and other soft tissues: Posterior surgical approach at the L4-5 level from the right-side has an expected appearance.   Disc levels:   At the surgical level and extending within the spinal canal from the upper L3 level to the L 4 5 disc level, there is a fluid collection which causes marked compression of the thecal sac. This is most severe at the level of  the L3-4 disc where the fluid collection within the canal measures 1.8 x 1.5 cm in diameter and markedly flattens the thecal sac anteriorly.   Other than the surgical decompression of the synovial cyst on the right and the postoperative intraspinal fluid collection, there is no change since the preoperative exam, as one might expect.   IMPRESSION: 1. At the surgical level and extending from the upper L3 level to the L4-5 disc level, there is a postoperative fluid collection within the spinal canal which causes marked compression of the thecal sac anteriorly. This is most severe at the level of the L3-4 disc where the fluid collection within the canal measures 1.8 x 1.5 cm in diameter and markedly flattens the thecal sac anteriorly.     Electronically Signed   By: Oneil Officer M.D.   On: 10/12/2022 16:20  PATIENT SURVEYS:  ABC scale: The Activities-Specific Balance Confidence (ABC) Scale 0% 10 20 30  40 50 60 70 80 90 100% No confidence<->completely confident  "How confident are you that you will not lose your balance or become unsteady when you . SABRA SABRA  Date tested 01/01/24  Walk around the house 80%  2. Walk up or down stairs 80%  3. Bend over and pick up a slipper from in front of a closet floor 60%  4. Reach for a small can off a shelf at eye level 90%  5. Stand on tip toes and reach for something above your head 0%  6. Stand on a chair and reach for something 40%  7. Sweep the floor 50%  8. Walk outside the house to a car parked in the driveway 90%  9. Get into or out of a car 80%  10. Walk across a parking lot to the mall 80%  11. Walk up or down a ramp 80%  12. Walk in a crowded mall where people rapidly walk past you 60%  13. Are bumped into by people as you walk through the mall 40%  14. Step onto or off of an escalator while you are holding onto the railing 50%  15. Step onto or off an escalator while holding onto parcels such that you cannot hold onto the railing 30%   16. Walk outside on icy sidewalks 0%  Total: #/16  57% (910/1600)       COGNITION: Overall cognitive status: Within functional limits for tasks assessed     SENSATION: Light touch: Impaired electricity down her RLE   MUSCLE LENGTH: Hamstrings: Not tested   Debby test: Not tested    POSTURE: rounded shoulders  PALPATION: Not performed    LUMBAR ROM:   AROM eval  Flexion 80%  Extension 100%  Right lateral flexion 100%  Left lateral flexion 100%  Right rotation 100%  Left rotation 100%   (Blank rows = not tested)  LOWER EXTREMITY ROM:     Active  Right eval Left eval  Hip flexion    Hip extension    Hip abduction    Hip adduction    Hip internal rotation    Hip external rotation    Knee flexion    Knee extension    Ankle dorsiflexion    Ankle plantarflexion    Ankle inversion    Ankle eversion     (Blank rows = not tested)  LOWER EXTREMITY MMT:    MMT Right eval Left eval  Hip flexion 4- 4  Hip extension    Hip abduction    Hip adduction    Hip internal rotation    Hip external rotation    Knee flexion 4 4  Knee extension 4 4  Ankle dorsiflexion 4 4  Ankle plantarflexion    Ankle inversion    Ankle eversion     (Blank rows = not tested)    FUNCTIONAL TESTS:  5 times sit to stand: 14 sec  30 seconds chair stand test: 11 reps   Timed up and go (TUG): 15.38 sec with use of single point cane   6 minute walk test: 553 ft  10 meter walk test: 0.61 m/sec                 DGI:  13/26                       <=19/24  predictive of falls in the elderly     GAIT: Distance walked: 30 ft    Assistive device utilized: Walker - 2 wheeled Level of assistance: Modified independence Comments: Significant use of BUE support with decrease step length bilaterally.    TREATMENT DATE:  02/26/24: NMR with single point cane and use of TENS setting at 5-  2 channel   Overground ambulation 10 meters-1 x  Obstacle Step Over - 1 ft  x 4   Overground  ambulation 10 meters - 1 x obstacle Step Over  6 inch x 4    Overground ambulation 10 meters- #3 AW Step Over  x 4   Overground ambulation 10 meters- (#3 AW Step Over X 2)  x 4   Overground ambulation 10 meters - fast and slow x 4    Overground ambulation 10 meters - vertical head turns x 4   Overground ambulation 10 meters- horizontal head turns x 4   THERAC   Standing Marches with BUE support on RLE only 1 x 10   -Pt externally rotates hip to compensate   Standing Step Tap using RLE  on 1 ft step with BUE support 1 x 10   Standing Step Tap using RLE  on 1 ft step with 1 UE support 1 x 10         PATIENT EDUCATION:  Education details: Form and technique for correct performance of exercise and explanation about cut-offs for functional tests.  Person educated: Patient Education method: Explanation, Demonstration, Verbal cues, and Handouts Education comprehension: verbalized understanding, returned demonstration, and verbal cues required  HOME EXERCISE PROGRAM: Access Code: [email or web address] URL: [email or web address] Date: 02/26/2024 Prepared by: Toribio Servant  Program Notes Toe to heel on right foot with mirror in front of you 3 sets  x 20 reps 5-7 days per week    Exercises - Seated Hamstring Stretch  - 1 x daily - 7 x weekly - 2-3 reps - 60 sec hold - Seated Hamstring Stretch (Mirrored)  - 1 x daily - 7 x weekly - 2-3 reps - 60 sec hold - Alternating Step Taps with Counter Support (Mirrored)  - 5-7 x weekly - 3 sets - 20 reps - Sit to Stand  - 3-4 x weekly - 3 sets - 10 reps - Standing Hip Extension with Counter Support  - 2-3 x daily - 5-7 x weekly - 3 sets - 10 reps - Standing Ankle Dorsiflexion with Table Support  - 5-7 x weekly - 3 sets - 20 reps - Seated Slump Neural Tensioner  - 1 x daily - 7 x weekly - 1 sets - 10 reps - Supine Quad Set (Mirrored)  - 1 x daily - 7 x weekly - 2 sets - 10 reps - 5 sec  hold - Supine Bilateral Hamstring Sets  - 1 x daily - 7 x  weekly - 2 sets - 10 reps - 5 sec  hold - Hooklying Single Leg Bent Knee Fallouts with Resistance  - 3-4 x weekly - 3 sets - 10 reps  CLINICAL IMPRESSION:   Pt shows ongoing improvement in pain tolerance with use of TENS especially with walking activity. She shows ongoing decreased step length and poor foot clearance on RLE. She does show ability to clear lower obstacles when RLE is lead foot with step over. PT focused on dynamic balance and therapeutic activity to improve pt's static balance especially when flexing right hip upwards and maintaining single leg stance on LLE. She will continue to benefit from skilled PT to improve motor control and strength of lower extremities to improve dynamic balance and gait stability to avoid falling and to decrease level of assistive device to return to her prior level of function. OBJECTIVE IMPAIRMENTS: Abnormal gait, decreased endurance, decreased  ROM, decreased strength, impaired flexibility, impaired sensation, obesity, and pain.   ACTIVITY LIMITATIONS: carrying, lifting, bending, standing, squatting, stairs, dressing, hygiene/grooming, and locomotion level  PARTICIPATION LIMITATIONS: meal prep, cleaning, laundry, shopping, community activity, and yard work  PERSONAL FACTORS: Fitness, Past/current experiences, Time since onset of injury/illness/exacerbation, and 3+ comorbidities: h/o stroke with right sided deficits, L3-L5 decompression, HLD, HTN are also affecting patient's functional outcome.   REHAB POTENTIAL: Fair multiple co-morbidities and chronicity of condition  CLINICAL DECISION MAKING: Evolving/moderate complexity  EVALUATION COMPLEXITY: Moderate   GOALS: Goals reviewed with patient? No  SHORT TERM GOALS: Target date: 01/15/2024  Patient will demonstrate undestanding of home exercise plan by performing exercises correctly with evidence of good carry over with min to no verbal or tactile cues .   Baseline: NT  01/16/24: Performing  Independently  Goal status: ACHIEVED     2.   Pt will increase by at least 21m (160ft) in order to demonstrate clinically significant improvement in cardiopulmonary endurance and community ambulation Baseline: 8/28: 553' with RW and unable to complete 6 minutes (stopped at 5 minutes)   Goal status: NOT MET     3.  Patient will show decreased risk of falling as evidence by decrease in self-selected gait speed by >= 0.12 m/sec.  Baseline: 8/28: 0.61 m/s Goal status: NOT MET    4.  Pt will decrease 5TSTS by at least 3 seconds to demonstrate a minimal clinically significant improvement in LE strength.  Baseline: 14 seconds  Goal status: NOT MET     LONG TERM GOALS: Target date: 03/25/2024  Patient will improve activities specific balance scale score >=67% as evidence of an improvement self-perception of function and to decrease risk for falls Mae et al., 2022).  Baseline: 57%  02/14/24: 33% Goal status: ONGOING    2.  Patient will perform 5 x STS in <11.4 sec to demonstrate improve LE strength to decrease risk of falling (Bohannon, 2006). Baseline: 14 sec   02/05/24:  14 sec  Goal status: ONGOING    3.  Patient will complete >=12 reps on 30 sec chair stands to exhibit improved LE endurance to decrease risk of falling.  Baseline: 11 reps   02/05/24: 11 reps    Goal status: ONGOING    4.  Patient will ambulate >=1000 ft in to show community ambulator status to be able to have aerobic endurance to return to fulfilling walking tasks at his job.  Baseline: 8/28: 553' with RW and unable to complete 6 minutes (stopped at 5 minutes)  02/05/24: 558 ft with 2WW   Goal status: ONGOING    5.  Patient will demonstrate reduced falls risk as evidenced by Dynamic Gait Index (DGI) >19/24 to decrease risk of falling.  Baseline: 13/24 with use of single point cane in RUE   02/19/24: 15/24-  mild for most, moderate for stairs and obstacle over.   Goal status: ONGOING     6.  Patient will  perform TUG in <13.5 sec as evidence of improved mobility and decreased risk for falling [facility] et al., 2000) Baseline:  8/28: 15.38 seconds with RW   02/12/24:  14.47 sec with 2 WW   Goal status: ONGOING    7. Patient will be able to ambulate safely with the least restrictive assistive device outside as  evidenced by decreased lateral sway, consistent stride length and step length, and with knowledge of when to take a break due to fatigue.  Baseline: Using 2WW or [phone]  Goal status: ONGOING     8. Patient will perform in >=1.0 m./sec as evidence of improved mobility, decreased falls risk, and overall functional independence.  Baseline: 8/28: 0.61 m/s  02/12/24: 0.73 m/sec    Goal status: ONGOING   PLAN:  PT FREQUENCY: 1-2x/week  PT DURATION: 12 weeks  PLANNED INTERVENTIONS: 97164- PT Re-evaluation, 97750- Physical Performance Testing, 97110-Therapeutic exercises, 97530- Therapeutic activity, 97112- Neuromuscular re-education, 97535- Self Care, 02859- Manual therapy, [phone]- Gait training, [phone]- Orthotic Initial, [phone]- Orthotic/Prosthetic subsequent, [phone]- Canalith repositioning, [phone]- Aquatic Therapy, [phone]- Electrical stimulation (unattended), [phone]- Electrical stimulation (manual), [id number]- Ultrasound, 79439 (1-2 muscles), [phone] (3+ muscles)- Dry Needling, Patient/Family education, Balance training, Stair training, Taping, Joint mobilization, Joint manipulation, Spinal manipulation, Spinal mobilization, Scar mobilization, Vestibular training, DME instructions, Cryotherapy, Moist heat, and Biofeedback.  PLAN FOR NEXT SESSION: Gait training and balance without use of an assistive device: Standing Hip Abduction and step taps. Further progression of dynamic balance with obstacle step overs using single point cane.      Toribio Servant PT, DPT  [facility] [address] [place], [address] Phone: [phone]   Fax:  [phone]

## 2024-02-26 NOTE — Progress Notes (Signed)
 Susan [name]                                          MRN: [id number]   02/26/2024   The [facility] Quality Team Specialist reviewed this patient medical record for the purposes of chart review for care gap closure. The following were reviewed: chart review for care gap closure-breast cancer screening, diabetic eye exam, and kidney health evaluation for diabetes:eGFR  and uACR.    [facility] Quality Team

## 2024-02-29 ENCOUNTER — Ambulatory Visit: Admitting: Physical Therapy

## 2024-02-29 DIAGNOSIS — R262 Difficulty in walking, not elsewhere classified: Secondary | ICD-10-CM

## 2024-02-29 DIAGNOSIS — M5459 Other low back pain: Secondary | ICD-10-CM

## 2024-02-29 DIAGNOSIS — R2689 Other abnormalities of gait and mobility: Secondary | ICD-10-CM

## 2024-02-29 NOTE — Therapy (Signed)
 OUTPATIENT PHYSICAL THERAPY THORACOLUMBAR AND BALANCE TREATMENT      Patient Name: Susan [name] MRN: [id number] DOB:1961-05-05, 63 y.o., female Today's Date: 02/29/2024  END OF SESSION:  PT End of Session - 02/29/24 1350     Visit Number 13    Number of Visits 24    Date for Recertification  03/25/24    Authorization Type [employer] Medicare Dual 2025    Authorization - Visit Number 13    Authorization - Number of Visits 24    Progress Note Due on Visit 20    PT Start Time 1345    PT Stop Time 1430    PT Time Calculation (min) 45 min    Activity Tolerance Patient tolerated treatment well    Behavior During Therapy [facility] for tasks assessed/performed                 Past Medical History:  Diagnosis Date   ADD (attention deficit disorder)    Alcohol abuse    Anemia    Anxiety    Aortic atherosclerosis 02/20/2018   Chest CT Sept 2019   Arthritis    rheumatoid arthritis   Asthma    Bipolar disorder (HCC)    Centrilobular emphysema (HCC)    Chronic diarrhea 07/22/2022   Cocaine use disorder, moderate, in sustained remission (HCC)    Constipation    Degenerative disc disease at L5-S1 level 09/28/2016   See ortho note May 2018   Depression    bipolar, hx of suicide attempt   Diabetes mellitus, type 2 (HCC)    Diverticulitis 2013   DOE (dyspnea on exertion)    Drug use    Family history of adverse reaction to anesthesia    mom-delayed emergence   GERD (gastroesophageal reflux disease)    H/O suicide attempt    slit wrists   Hepatitis C 06/26/2014   treated with Harvoni   Hip pain    History of echocardiogram    a. 04/2019 Echo: EF >65%, nl RV fxn.   History of MRSA infection 2013   HLD (hyperlipidemia)    Hypertension    a. 06/2021 Renal Duplex: ? bilat RAS; b. 06/2021 CTA Abd: No signif RAS.   Hyponatremia    Hypothyroidism    Incisional hernia 11/08/2012   Knee pain    Left carotid bruit    Morbid obesity (HCC)    Multinodular thyroid     OSA  (obstructive sleep apnea)    OSA on CPAP    Osteoporosis    Palpitations    Post-traumatic osteoarthritis of right knee 09/24/2015   Recurrent ventral hernia 11/08/2012   Skin lesion 06/28/2022   Status post total hip replacement, left 07/23/2019   Status post total right knee replacement using cement 05/10/2016   Stroke (HCC) 07/05/2022   right arm,torso and right leg numbness   Tobacco use disorder    Tobacco use disorder, continuous 06/26/2014   Vitamin B12 deficiency    Vitamin D  deficiency 04/09/2015   Past Surgical History:  Procedure Laterality Date   BILATERAL SALPINGOOPHORECTOMY     due to abnormal mass   BREAST BIOPSY Left    neg   BREAST SURGERY Left 20 yrs ago   CESAREAN SECTION     COLONOSCOPY     COLONOSCOPY WITH PROPOFOL  N/A 10/16/2017   Procedure: COLONOSCOPY WITH PROPOFOL ;  Surgeon: Therisa Bi, MD;  Location: [facility] ENDOSCOPY;  Service: Gastroenterology;  Laterality: N/A;   COLONOSCOPY WITH PROPOFOL  N/A 02/16/2021   Procedure:  COLONOSCOPY WITH PROPOFOL ;  Surgeon: Therisa Bi, MD;  Location: [facility] ENDOSCOPY;  Service: Gastroenterology;  Laterality: N/A;   FORAMINOTOMY 1 LEVEL Right 10/10/2022   Procedure: RIGHT L4-5 LAMINOFORAMINOTOMY;  Surgeon: Clois Fret, MD;  Location: [facility] ORS;  Service: Neurosurgery;  Laterality: Right;   HERNIA REPAIR  06/2011, July 2014   Ventral wall repair with Physiomesh   HERNIA REPAIR     2nd.vental wall repair   JOINT REPLACEMENT Right    knee   LUMBAR LAMINECTOMY/ DECOMPRESSION WITH MET-RX N/A 10/12/2022   Procedure: LUMBAR LAMINECTOMY/ DECOMPRESSION WITH MET-RX;  Surgeon: Clois Fret, MD;  Location: [facility] ORS;  Service: Neurosurgery;  Laterality: N/A;   TONSILLECTOMY     TOTAL HIP ARTHROPLASTY Left 07/23/2019   Procedure: TOTAL HIP ARTHROPLASTY;  Surgeon: Edie Norleen PARAS, MD;  Location: [facility] ORS;  Service: Orthopedics;  Laterality: Left;   TOTAL KNEE ARTHROPLASTY Right 05/10/2016   Procedure: TOTAL KNEE ARTHROPLASTY;   Surgeon: Norleen PARAS Edie, MD;  Location: [facility] ORS;  Service: Orthopedics;  Laterality: Right;   TUBAL LIGATION     Patient Active Problem List   Diagnosis Date Noted   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   Chronic pain syndrome 02/15/2024   Urge incontinence 12/22/2023   Painful and cold lower extremity 12/22/2023   Hemiplegia and hemiparesis following cerebral infarction affecting right dominant side (HCC) 12/04/2023   CKD (chronic kidney disease) stage 3, GFR 30-59 ml/min (HCC) 12/04/2023   Dysuria 11/03/2023   History of stroke 04/17/2023   Right leg weakness 04/17/2023   Neurogenic bladder 02/13/2023   Abnormality of gait and mobility 02/13/2023   Morbid obesity with body mass index (BMI) of 40.0 to 44.9 in adult (HCC) 01/10/2023   Left thyroid  nodule 01/10/2023   Bipolar 1 disorder, depressed, moderate (HCC) 11/29/2022   Normocytic normochromic anemia 10/13/2022   Epidural hematoma (HCC) 10/12/2022   Mass in epidural space 10/12/2022   Synovial cyst of lumbar facet joint 10/10/2022   Chronic radicular lumbar pain 10/10/2022   S/P laminectomy 10/10/2022   Epigastric pain 07/22/2022   Enlarged thyroid  07/22/2022   CVA (cerebrovascular accident) (HCC) 07/05/2022   Type II diabetes mellitus with nephropathy (HCC) 06/16/2021   H/O thyroid  nodule 05/31/2021   History of hepatitis C- treated 2014- 2015 with [facility] and followed.  05/19/2021   Left carotid bruit 04/28/2021   Bipolar disorder, in full remission, most recent episode depressed 02/05/2021   Anxiety 05/28/2020   Bipolar affective disorder in remission 05/28/2020   Bipolar disorder, in full remission, most recent episode mixed 03/06/2020   Need for immunization against influenza 01/27/2020   Morbid obesity (HCC) 01/27/2020   Osteoporosis 11/06/2019   Bipolar 1 disorder, mixed, mild (HCC) 10/29/2019   PTSD (post-traumatic stress disorder) 09/27/2019   Bereavement 09/27/2019   Cocaine use disorder,  moderate, in sustained remission (HCC) 09/27/2019   Primary osteoarthritis of left hip 06/14/2019   Allergic rhinitis 08/23/2018   Aortic atherosclerosis 02/20/2018   Centrilobular emphysema (HCC) 02/20/2018   Hyperlipidemia 03/28/2017   Degenerative disc disease at L5-S1 level 09/28/2016   GERD (gastroesophageal reflux disease) 12/17/2015   Chronic back pain 10/09/2015   Post-traumatic osteoarthritis of right knee 09/24/2015   Alcohol use disorder, severe, in sustained remission (HCC) 04/13/2015   OSA on CPAP 04/09/2015   Subclinical hyperthyroidism 06/26/2014   Goiter colloid, toxic, nodular 06/26/2014   Hypertension 06/26/2014   Bipolar I disorder (HCC) 06/26/2014   Lumbar radiculitis 10/03/2013   Diverticulosis 08/24/2013    PCP:  Dr. Luke Shade     REFERRING PROVIDER: Dr. Reeves Daisy    REFERRING DIAG: [phone] (ICD-10-CM) - Chronic bilateral low back pain without sciatica  Rationale for Evaluation and Treatment: Rehabilitation  THERAPY DIAG:  Other low back pain  Imbalance  Difficulty in walking, not elsewhere classified  ONSET DATE: 10/2022  SUBJECTIVE:                                                                                                                                                                                           SUBJECTIVE STATEMENT:   Pt reports that she is feeling pain today but she has not had to take pain pills yet.     PERTINENT HISTORY:  She has prior history of left sided stroke affecting her right side in February 2024. She also had a L3-L4 lumbar decompression surgery in June 2024 with complications due to a epidural hematoma that resulted in cauda equinda syndrome and her needing to return to ER for evacuation and she was in the hospital for an additional 8 days following surgery.  She is just moving back to [place] from [place], because she has qualified for housing in [place]. She describes being most concerned  with her balance especially when walking over uneven surfaces. She mainly uses a a 2WW when walking to places that she has to drive to because it is easier to transport. She has not had a fall in the past 6 mo, but she is scared of falling especially when walking over uneven surfaces. She does report an improvement in LE function with her now being able to raise her toes and that she was not able to do this after surgery.   PAIN:  Are you having pain? Yes: NPRS scale: 7/10       Pain location: Right ischial tuberosity   Pain description: Dull and achy      Aggravating factors: Walking and performing hip abduction  Relieving factors: Using ice and keep still     PRECAUTIONS: Fall; she is afraid of falling    RED FLAGS: None   WEIGHT BEARING RESTRICTIONS: No  FALLS:  Has patient fallen in last 6 months? No  LIVING ENVIRONMENT: Lives with: lives alone Lives in: House/apartment Stairs: Yes: External: 4 steps; on right going up and there is a ramp going to a house and there is a ramped main entrance leading to lobby. Place is called [employer]   Has following equipment at home: Single point cane, Environmental consultant - 2 wheeled, Environmental consultant - 4 wheeled, shower chair, Grab bars, and Ramped entry  OCCUPATION: Retired   PLOF: Independent  PATIENT  GOALS: Pt wants to be able to walk outside using single point cane and to feel more confidence when walking especially over uneven surfaces. She currently uses rollator to walk dog at home and 2WW when walking anywhere where she has to drive.   NEXT MD VISIT: October 2025     OBJECTIVE:  Note: Objective measures were completed at Evaluation unless otherwise noted.  VITALS BP 118/66 HR  60 Sp02  97%  DIAGNOSTIC FINDINGS:   EXAM: MRI LUMBAR SPINE 02/23/2024  IMPRESSION: 1. No compressive central canal stenosis or acute neural impingement. 2. Moderate left lateral recess stenosis at L2-3 without definite neural compression. 3. Postoperative changes  at L3-4 without recurrent canal stenosis; left foraminal narrowing that could affect the L3 nerve. Resolution of the previously seen posterior extradural fluid collection. 4. Scoliotic curvature convex to the right with apex at L2-3, similar to the prior exam. 5. Mild edematous endplate changes at L2-3 and L3-4, which may contribute to regional back pain. 6. No anatomic limitation anticipated for spinal cord stimulator placement at T11-12 or T12-L1.   Electronically signed by: Oneil Officer MD 02/26/2024 09:46 AM EDT RP Workstation: [id number]  /////////////////////////////////////////////////////////////////////////////////////////////////////////////////// CLINICAL DATA:  Low back pain. Cauda equina syndrome suspected. Cyst removal from the spine two days ago. Postoperative urinary incontinence and bilateral leg numbness.   EXAM: MRI LUMBAR SPINE WITHOUT CONTRAST   TECHNIQUE: Multiplanar, multisequence MR imaging of the lumbar spine was performed. No intravenous contrast was administered.   COMPARISON:  05/18/2022   FINDINGS: Segmentation: 5 lumbar type vertebral bodies as numbered previously.   Alignment: Scoliotic curvature convex to the right as seen previously. No significant listhesis.   Vertebrae: Edematous change of the endplates at T10, U88 and T12, likely to relate to regional pain. This is probably degenerative. Lesser vertebral endplate edema noted at L3-4 and L4-5.   Conus medullaris and cauda equina: Conus extends to the L1 level. Conus and cauda equina appear normal.   Paraspinal and other soft tissues: Posterior surgical approach at the L4-5 level from the right-side has an expected appearance.   Disc levels:   At the surgical level and extending within the spinal canal from the upper L3 level to the L 4 5 disc level, there is a fluid collection which causes marked compression of the thecal sac. This is most severe at the level of the L3-4 disc where the  fluid collection within the canal measures 1.8 x 1.5 cm in diameter and markedly flattens the thecal sac anteriorly.   Other than the surgical decompression of the synovial cyst on the right and the postoperative intraspinal fluid collection, there is no change since the preoperative exam, as one might expect.   IMPRESSION: 1. At the surgical level and extending from the upper L3 level to the L4-5 disc level, there is a postoperative fluid collection within the spinal canal which causes marked compression of the thecal sac anteriorly. This is most severe at the level of the L3-4 disc where the fluid collection within the canal measures 1.8 x 1.5 cm in diameter and markedly flattens the thecal sac anteriorly.     Electronically Signed   By: Oneil Officer M.D.   On: 10/12/2022 16:20  PATIENT SURVEYS:  ABC scale: The Activities-Specific Balance Confidence (ABC) Scale 0% 10 20 30  40 50 60 70 80 90 100% No confidence<->completely confident  "How confident are you that you will not lose your balance or become unsteady when you . . .   Date  tested 01/01/24  Walk around the house 80%  2. Walk up or down stairs 80%  3. Bend over and pick up a slipper from in front of a closet floor 60%  4. Reach for a small can off a shelf at eye level 90%  5. Stand on tip toes and reach for something above your head 0%  6. Stand on a chair and reach for something 40%  7. Sweep the floor 50%  8. Walk outside the house to a car parked in the driveway 90%  9. Get into or out of a car 80%  10. Walk across a parking lot to the mall 80%  11. Walk up or down a ramp 80%  12. Walk in a crowded mall where people rapidly walk past you 60%  13. Are bumped into by people as you walk through the mall 40%  14. Step onto or off of an escalator while you are holding onto the railing 50%  15. Step onto or off an escalator while holding onto parcels such that you cannot hold onto the railing 30%  16. Walk outside on  icy sidewalks 0%  Total: #/16  57% (910/1600)       COGNITION: Overall cognitive status: Within functional limits for tasks assessed     SENSATION: Light touch: Impaired electricity down her RLE   MUSCLE LENGTH: Hamstrings: Not tested   Debby test: Not tested    POSTURE: rounded shoulders  PALPATION: Not performed    LUMBAR ROM:   AROM eval  Flexion 80%  Extension 100%  Right lateral flexion 100%  Left lateral flexion 100%  Right rotation 100%  Left rotation 100%   (Blank rows = not tested)  LOWER EXTREMITY ROM:     Active  Right eval Left eval  Hip flexion    Hip extension    Hip abduction    Hip adduction    Hip internal rotation    Hip external rotation    Knee flexion    Knee extension    Ankle dorsiflexion    Ankle plantarflexion    Ankle inversion    Ankle eversion     (Blank rows = not tested)  LOWER EXTREMITY MMT:    MMT Right eval Left eval  Hip flexion 4- 4  Hip extension    Hip abduction    Hip adduction    Hip internal rotation    Hip external rotation    Knee flexion 4 4  Knee extension 4 4  Ankle dorsiflexion 4 4  Ankle plantarflexion    Ankle inversion    Ankle eversion     (Blank rows = not tested)    FUNCTIONAL TESTS:  5 times sit to stand: 14 sec  30 seconds chair stand test: 11 reps   Timed up and go (TUG): 15.38 sec with use of single point cane   6 minute walk test: 553 ft  10 meter walk test: 0.61 m/sec                 DGI:  13/26                       <=19/24  predictive of falls in the elderly     GAIT: Distance walked: 30 ft    Assistive device utilized: Walker - 2 wheeled Level of assistance: Modified independence Comments: Significant use of BUE support with decrease step length bilaterally.    TREATMENT DATE:  02/29/24:   THERAC- use of TENS with setting at 4 with bi-channel and box pattern   Standing Hip Abduction on RLE with BUE support 1 x 10   Standing Hip Flexion on RLE with BUE support   1 x  10  Standing Hip Abduction on RLE with BUE support 1 x 10   Standing Hip Flexion on RLE with BUE support   1 x 10  Standing Heel Raises on BUE support 1 x 10  -Pt unable to maintain knee extension while performing heel raises    Long Sitting Resisted PF on RLE with green band 1 x 15   Long Sitting Resisted PF on RLE with blue band 1 x 15   Standing HS Curl on RLE with BUE support 1 x 10   Standing HS Curl on RLE with #3 AW with BUE support 1 x 10    Standing Hip Extension on RLE with BE support 1 x 10   Sit to Stand at 20 inch mat height 1  x 10 without BUE support       PATIENT EDUCATION:  Education details: Form and technique for correct performance of exercise and explanation about cut-offs for functional tests.  Person educated: Patient Education method: Explanation, Demonstration, Verbal cues, and Handouts Education comprehension: verbalized understanding, returned demonstration, and verbal cues required  HOME EXERCISE PROGRAM: Access Code: [email or web address] URL: [email or web address] Date: 02/29/2024 Prepared by: Toribio Servant  Program Notes Toe to heel on right foot with mirror in front of you 3 sets  x 20 reps 5-7 days per week    Exercises - Seated Slump Neural Tensioner  - 1 x daily - 7 x weekly - 1 sets - 10 reps - Seated Hamstring Stretch  - 1 x daily - 7 x weekly - 2-3 reps - 60 sec hold - Seated Hamstring Stretch (Mirrored)  - 1 x daily - 7 x weekly - 2-3 reps - 60 sec hold - Alternating Step Taps with Counter Support (Mirrored)  - 5-7 x weekly - 3 sets - 20 reps - Sit to Stand  - 3-4 x weekly - 3 sets - 10 reps - Standing Ankle Dorsiflexion with Table Support  - 3-4 x weekly - 3 sets - 10 reps - Standing March with Counter Support (Mirrored)  - 3-4 x weekly - 3 sets - 10 reps - Standing Knee Flexion AROM with Chair Support (Mirrored)  - 3-4 x weekly - 3 sets - 10 reps - Standing Hip Abduction with Counter Support (Mirrored)  - 3-4 x weekly - 3 sets - 10  reps - Long Sitting Ankle Plantar Flexion with Resistance (Mirrored)  - 3-4 x weekly - 3 sets - 15 reps  CLINICAL IMPRESSION:   Pt continues to progress towards goals with improvement in hip strength and decrease in pain with movement with ongoing use of TENS. PT able to progress hips and knee strengthening exercises to include standing positions to promote single leg balance as well as hip strengthening due to pt's pain being well controlled with use of TENS unit. She will continue to benefit from skilled PT to improve motor control and strength of lower extremities to improve dynamic balance and gait stability to avoid falling and to decrease level of assistive device to return to her prior level of function.  OBJECTIVE IMPAIRMENTS: Abnormal gait, decreased endurance, decreased ROM, decreased strength, impaired flexibility, impaired sensation, obesity, and pain.   ACTIVITY LIMITATIONS: carrying, lifting, bending, standing, squatting, stairs, dressing, hygiene/grooming, and  locomotion level  PARTICIPATION LIMITATIONS: meal prep, cleaning, laundry, shopping, community activity, and yard work  PERSONAL FACTORS: Fitness, Past/current experiences, Time since onset of injury/illness/exacerbation, and 3+ comorbidities: h/o stroke with right sided deficits, L3-L5 decompression, HLD, HTN are also affecting patient's functional outcome.   REHAB POTENTIAL: Fair multiple co-morbidities and chronicity of condition  CLINICAL DECISION MAKING: Evolving/moderate complexity  EVALUATION COMPLEXITY: Moderate   GOALS: Goals reviewed with patient? No  SHORT TERM GOALS: Target date: 01/15/2024  Patient will demonstrate undestanding of home exercise plan by performing exercises correctly with evidence of good carry over with min to no verbal or tactile cues .   Baseline: NT  01/16/24: Performing Independently  Goal status: ACHIEVED     2.   Pt will increase by at least 40m (176ft) in order to demonstrate  clinically significant improvement in cardiopulmonary endurance and community ambulation Baseline: 8/28: 553' with RW and unable to complete 6 minutes (stopped at 5 minutes)   Goal status: NOT MET     3.  Patient will show decreased risk of falling as evidence by decrease in self-selected gait speed by >= 0.12 m/sec.  Baseline: 8/28: 0.61 m/s Goal status: NOT MET    4.  Pt will decrease 5TSTS by at least 3 seconds to demonstrate a minimal clinically significant improvement in LE strength.  Baseline: 14 seconds  Goal status: NOT MET     LONG TERM GOALS: Target date: 03/25/2024  Patient will improve activities specific balance scale score >=67% as evidence of an improvement self-perception of function and to decrease risk for falls Mae et al., 2022).  Baseline: 57%  02/14/24: 33% Goal status: ONGOING    2.  Patient will perform 5 x STS in <11.4 sec to demonstrate improve LE strength to decrease risk of falling (Bohannon, 2006). Baseline: 14 sec   02/05/24:  14 sec  Goal status: ONGOING    3.  Patient will complete >=12 reps on 30 sec chair stands to exhibit improved LE endurance to decrease risk of falling.  Baseline: 11 reps   02/05/24: 11 reps    Goal status: ONGOING    4.  Patient will ambulate >=1000 ft in to show community ambulator status to be able to have aerobic endurance to return to fulfilling walking tasks at his job.  Baseline: 8/28: 553' with RW and unable to complete 6 minutes (stopped at 5 minutes)  02/05/24: 558 ft with 2WW   Goal status: ONGOING    5.  Patient will demonstrate reduced falls risk as evidenced by Dynamic Gait Index (DGI) >19/24 to decrease risk of falling.  Baseline: 13/24 with use of single point cane in RUE   02/19/24: 15/24-  mild for most, moderate for stairs and obstacle over.   Goal status: ONGOING     6.  Patient will perform TUG in <13.5 sec as evidence of improved mobility and decreased risk for falling [facility] et al.,  2000) Baseline:  8/28: 15.38 seconds with RW   02/12/24:  14.47 sec with 2 WW   Goal status: ONGOING    7. Patient will be able to ambulate safely with the least restrictive assistive device outside as  evidenced by decreased lateral sway, consistent stride length and step length, and with knowledge of when to take a break due to fatigue.  Baseline: Using 2WW or 4WW Goal status: ONGOING     8. Patient will perform in >=1.0 m./sec as evidence of improved mobility, decreased falls risk,  and overall functional independence.  Baseline: 8/28: 0.61 m/s  02/12/24: 0.73 m/sec    Goal status: ONGOING   PLAN:  PT FREQUENCY: 1-2x/week  PT DURATION: 12 weeks  PLANNED INTERVENTIONS: [id number]- PT Re-evaluation, 97750- Physical Performance Testing, 97110-Therapeutic exercises, 97530- Therapeutic activity, 97112- Neuromuscular re-education, 97535- Self Care, [id number]- Manual therapy, [phone]- Gait training, [phone]- Orthotic Initial, [phone]- Orthotic/Prosthetic subsequent, [phone]- Canalith repositioning, [phone]- Aquatic Therapy, [phone]- Electrical stimulation (unattended), [phone]- Electrical stimulation (manual), [id number]- Ultrasound, [id number] (1-2 muscles), [phone] (3+ muscles)- Dry Needling, Patient/Family education, Balance training, Stair training, Taping, Joint mobilization, Joint manipulation, Spinal manipulation, Spinal mobilization, Scar mobilization, Vestibular training, DME instructions, Cryotherapy, Moist heat, and Biofeedback.  PLAN FOR NEXT SESSION: Ongoing progression of OTAGO exercises: mini-squats. Step Ups on RLE if possible and work on walking with for improved mobility and cardovascular improvement  Toribio Servant PT, DPT  [facility] [address], [place], [address] Phone: [phone]   Fax:  [phone]

## 2024-03-04 ENCOUNTER — Ambulatory Visit: Admitting: Physical Therapy

## 2024-03-07 ENCOUNTER — Ambulatory Visit: Admitting: Physical Therapy

## 2024-03-07 DIAGNOSIS — M5459 Other low back pain: Secondary | ICD-10-CM | POA: Diagnosis not present

## 2024-03-07 DIAGNOSIS — R262 Difficulty in walking, not elsewhere classified: Secondary | ICD-10-CM

## 2024-03-07 DIAGNOSIS — R2689 Other abnormalities of gait and mobility: Secondary | ICD-10-CM

## 2024-03-07 NOTE — Therapy (Signed)
 OUTPATIENT PHYSICAL THERAPY THORACOLUMBAR AND BALANCE TREATMENT      [name] Name: Susan [name] MRN: [id number] DOB:[birth year], 63 y.o., female Today's Date: 03/07/2024  END OF SESSION:  PT End of Session - 03/07/24 1355     Visit Number 14    Number of Visits 24    Date for Recertification  03/25/24    Authorization Type [employer] Medicare Dual 2025    Authorization - Visit Number 14    Authorization - Number of Visits 24    Progress Note Due on Visit 20    PT Start Time 1345    PT Stop Time 1430    PT Time Calculation (min) 45 min    Activity Tolerance [name] tolerated treatment well    Behavior During Therapy [facility] for tasks assessed/performed                  Past Medical History:  Diagnosis Date   ADD (attention deficit disorder)    Alcohol abuse    Anemia    Anxiety    Aortic atherosclerosis 02/20/2018   Chest CT Sept 2019   Arthritis    rheumatoid arthritis   Asthma    Bipolar disorder (HCC)    Centrilobular emphysema (HCC)    Chronic diarrhea 07/22/2022   Cocaine use disorder, moderate, in sustained remission (HCC)    Constipation    Degenerative disc disease at L5-S1 level 09/28/2016   See ortho note May 2018   Depression    bipolar, hx of suicide attempt   Diabetes mellitus, type 2 (HCC)    Diverticulitis 2013   DOE (dyspnea on exertion)    Drug use    Family history of adverse reaction to anesthesia    mom-delayed emergence   GERD (gastroesophageal reflux disease)    H/O suicide attempt    slit wrists   Hepatitis C 06/26/2014   treated with Harvoni   Hip pain    History of echocardiogram    a. 04/2019 Echo: EF >65%, nl RV fxn.   History of MRSA infection 2013   HLD (hyperlipidemia)    Hypertension    a. 06/2021 Renal Duplex: ? bilat RAS; b. 06/2021 CTA Abd: No signif RAS.   Hyponatremia    Hypothyroidism    Incisional hernia 11/08/2012   Knee pain    Left carotid bruit    Morbid obesity (HCC)    Multinodular thyroid     OSA  (obstructive sleep apnea)    OSA on CPAP    Osteoporosis    Palpitations    Post-traumatic osteoarthritis of right knee 09/24/2015   Recurrent ventral hernia 11/08/2012   Skin lesion 06/28/2022   Status post total hip replacement, left 07/23/2019   Status post total right knee replacement using cement 05/10/2016   Stroke (HCC) 07/05/2022   right arm,torso and right leg numbness   Tobacco use disorder    Tobacco use disorder, continuous 06/26/2014   Vitamin B12 deficiency    Vitamin D  deficiency 04/09/2015   Past Surgical History:  Procedure Laterality Date   BILATERAL SALPINGOOPHORECTOMY     due to abnormal mass   BREAST BIOPSY Left    neg   BREAST SURGERY Left 20 yrs ago   CESAREAN SECTION     COLONOSCOPY     COLONOSCOPY WITH PROPOFOL  N/A 10/16/2017   Procedure: COLONOSCOPY WITH PROPOFOL ;  Surgeon: Therisa Bi, MD;  Location: [facility] ENDOSCOPY;  Service: Gastroenterology;  Laterality: N/A;   COLONOSCOPY WITH PROPOFOL  N/A 02/16/2021  Procedure: COLONOSCOPY WITH PROPOFOL ;  Surgeon: Therisa Bi, MD;  Location: [facility] ENDOSCOPY;  Service: Gastroenterology;  Laterality: N/A;   FORAMINOTOMY 1 LEVEL Right 10/10/2022   Procedure: RIGHT L4-5 LAMINOFORAMINOTOMY;  Surgeon: Clois Fret, MD;  Location: [facility] ORS;  Service: Neurosurgery;  Laterality: Right;   HERNIA REPAIR  06/2011, July 2014   Ventral wall repair with Physiomesh   HERNIA REPAIR     2nd.vental wall repair   JOINT REPLACEMENT Right    knee   LUMBAR LAMINECTOMY/ DECOMPRESSION WITH MET-RX N/A 10/12/2022   Procedure: LUMBAR LAMINECTOMY/ DECOMPRESSION WITH MET-RX;  Surgeon: Clois Fret, MD;  Location: [facility] ORS;  Service: Neurosurgery;  Laterality: N/A;   TONSILLECTOMY     TOTAL HIP ARTHROPLASTY Left 07/23/2019   Procedure: TOTAL HIP ARTHROPLASTY;  Surgeon: Edie Norleen PARAS, MD;  Location: [facility] ORS;  Service: Orthopedics;  Laterality: Left;   TOTAL KNEE ARTHROPLASTY Right 05/10/2016   Procedure: TOTAL KNEE ARTHROPLASTY;   Surgeon: Norleen PARAS Edie, MD;  Location: [facility] ORS;  Service: Orthopedics;  Laterality: Right;   TUBAL LIGATION     [name] Active Problem List   Diagnosis Date Noted   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   Chronic pain syndrome 02/15/2024   Urge incontinence 12/22/2023   Painful and cold lower extremity 12/22/2023   Hemiplegia and hemiparesis following cerebral infarction affecting right dominant side (HCC) 12/04/2023   CKD (chronic kidney disease) stage 3, GFR 30-59 ml/min (HCC) 12/04/2023   Dysuria 11/03/2023   History of stroke 04/17/2023   Right leg weakness 04/17/2023   Neurogenic bladder 02/13/2023   Abnormality of gait and mobility 02/13/2023   Morbid obesity with body mass index (BMI) of 40.0 to 44.9 in adult (HCC) 01/10/2023   Left thyroid  nodule 01/10/2023   Bipolar 1 disorder, depressed, moderate (HCC) 11/29/2022   Normocytic normochromic anemia 10/13/2022   Epidural hematoma (HCC) 10/12/2022   Mass in epidural space 10/12/2022   Synovial cyst of lumbar facet joint 10/10/2022   Chronic radicular lumbar pain 10/10/2022   S/P laminectomy 10/10/2022   Epigastric pain 07/22/2022   Enlarged thyroid  07/22/2022   CVA (cerebrovascular accident) (HCC) 07/05/2022   Type II diabetes mellitus with nephropathy (HCC) 06/16/2021   H/O thyroid  nodule 05/31/2021   History of hepatitis C- treated 2014- 2015 with [facility] gastroenterology and followed.  05/19/2021   Left carotid bruit 04/28/2021   Bipolar disorder, in full remission, most recent episode depressed 02/05/2021   Anxiety 05/28/2020   Bipolar affective disorder in remission 05/28/2020   Bipolar disorder, in full remission, most recent episode mixed 03/06/2020   Need for immunization against influenza 01/27/2020   Morbid obesity (HCC) 01/27/2020   Osteoporosis 11/06/2019   Bipolar 1 disorder, mixed, mild (HCC) 10/29/2019   PTSD (post-traumatic stress disorder) 09/27/2019   Bereavement 09/27/2019   Cocaine use disorder,  moderate, in sustained remission (HCC) 09/27/2019   Primary osteoarthritis of left hip 06/14/2019   Allergic rhinitis 08/23/2018   Aortic atherosclerosis 02/20/2018   Centrilobular emphysema (HCC) 02/20/2018   Hyperlipidemia 03/28/2017   Degenerative disc disease at L5-S1 level 09/28/2016   GERD (gastroesophageal reflux disease) 12/17/2015   Chronic back pain 10/09/2015   Post-traumatic osteoarthritis of right knee 09/24/2015   Alcohol use disorder, severe, in sustained remission (HCC) 04/13/2015   OSA on CPAP 04/09/2015   Subclinical hyperthyroidism 06/26/2014   Goiter colloid, toxic, nodular 06/26/2014   Hypertension 06/26/2014   Bipolar I disorder (HCC) 06/26/2014   Lumbar radiculitis 10/03/2013   Diverticulosis 08/24/2013  PCP: Dr. Luke Shade     REFERRING PROVIDER: Dr. Reeves Daisy    REFERRING DIAG: [phone] (ICD-10-CM) - Chronic bilateral low back pain without sciatica  Rationale for Evaluation and Treatment: Rehabilitation  THERAPY DIAG:  Other low back pain  Imbalance  Difficulty in walking, not elsewhere classified  ONSET DATE: 10/2022  SUBJECTIVE:                                                                                                                                                                                           SUBJECTIVE STATEMENT:   Pt reports that she is feeling less pain today and she wants to try and walk using single point cane.       PERTINENT HISTORY:  She has prior history of left sided stroke affecting her right side in February 2024. She also had a L3-L4 lumbar decompression surgery in June 2024 with complications due to a epidural hematoma that resulted in cauda equinda syndrome and her needing to return to ER for evacuation and she was in the hospital for an additional 8 days following surgery.  She is just moving back to [place] from [place], because she has qualified for housing in [place]. She describes  being most concerned with her balance especially when walking over uneven surfaces. She mainly uses a a 2WW when walking to places that she has to drive to because it is easier to transport. She has not had a fall in the past 6 mo, but she is scared of falling especially when walking over uneven surfaces. She does report an improvement in LE function with her now being able to raise her toes and that she was not able to do this after surgery.   PAIN:  Are you having pain? Yes: NPRS scale: 5/10       Pain location: Right ischial tuberosity   Pain description: Dull and achy      Aggravating factors: Walking and performing hip abduction  Relieving factors: Using ice and keep still     PRECAUTIONS: Fall; she is afraid of falling    RED FLAGS: None   WEIGHT BEARING RESTRICTIONS: No  FALLS:  Has [name] fallen in last 6 months? No  LIVING ENVIRONMENT: Lives with: lives alone Lives in: House/apartment Stairs: Yes: External: 4 steps; on right going up and there is a ramp going to a house and there is a ramped main entrance leading to lobby. Place is called [employer]   Has following equipment at home: Single point cane, Environmental Consultant - 2 wheeled, Environmental Consultant - 4 wheeled, shower chair, Grab bars, and Ramped entry  OCCUPATION: Retired  PLOF: Independent  [name] GOALS: Pt wants to be able to walk outside using single point cane and to feel more confidence when walking especially over uneven surfaces. She currently uses rollator to walk dog at home and 2WW when walking anywhere where she has to drive.   NEXT MD VISIT: October 2025     OBJECTIVE:  Note: Objective measures were completed at Evaluation unless otherwise noted.  VITALS BP 118/66 HR  60 Sp02  97%  DIAGNOSTIC FINDINGS:   EXAM: MRI LUMBAR SPINE 02/23/2024  IMPRESSION: 1. No compressive central canal stenosis or acute neural impingement. 2. Moderate left lateral recess stenosis at L2-3 without definite neural compression. 3.  Postoperative changes at L3-4 without recurrent canal stenosis; left foraminal narrowing that could affect the L3 nerve. Resolution of the previously seen posterior extradural fluid collection. 4. Scoliotic curvature convex to the right with apex at L2-3, similar to the prior exam. 5. Mild edematous endplate changes at L2-3 and L3-4, which may contribute to regional back pain. 6. No anatomic limitation anticipated for spinal cord stimulator placement at T11-12 or T12-L1.   Electronically signed by: Oneil Officer MD 02/26/2024 09:46 AM EDT RP Workstation: [id number]  /////////////////////////////////////////////////////////////////////////////////////////////////////////////////// CLINICAL DATA:  Low back pain. Cauda equina syndrome suspected. Cyst removal from the spine two days ago. Postoperative urinary incontinence and bilateral leg numbness.   EXAM: MRI LUMBAR SPINE WITHOUT CONTRAST   TECHNIQUE: Multiplanar, multisequence MR imaging of the lumbar spine was performed. No intravenous contrast was administered.   COMPARISON:  05/18/2022   FINDINGS: Segmentation: 5 lumbar type vertebral bodies as numbered previously.   Alignment: Scoliotic curvature convex to the right as seen previously. No significant listhesis.   Vertebrae: Edematous change of the endplates at T10, U88 and T12, likely to relate to regional pain. This is probably degenerative. Lesser vertebral endplate edema noted at L3-4 and L4-5.   Conus medullaris and cauda equina: Conus extends to the L1 level. Conus and cauda equina appear normal.   Paraspinal and other soft tissues: Posterior surgical approach at the L4-5 level from the right-side has an expected appearance.   Disc levels:   At the surgical level and extending within the spinal canal from the upper L3 level to the L 4 5 disc level, there is a fluid collection which causes marked compression of the thecal sac. This is most severe at the level of  the L3-4 disc where the fluid collection within the canal measures 1.8 x 1.5 cm in diameter and markedly flattens the thecal sac anteriorly.   Other than the surgical decompression of the synovial cyst on the right and the postoperative intraspinal fluid collection, there is no change since the preoperative exam, as one might expect.   IMPRESSION: 1. At the surgical level and extending from the upper L3 level to the L4-5 disc level, there is a postoperative fluid collection within the spinal canal which causes marked compression of the thecal sac anteriorly. This is most severe at the level of the L3-4 disc where the fluid collection within the canal measures 1.8 x 1.5 cm in diameter and markedly flattens the thecal sac anteriorly.     Electronically Signed   By: Oneil Officer M.D.   On: 10/12/2022 16:20  [name] SURVEYS:  ABC scale: The Activities-Specific Balance Confidence (ABC) Scale 0% 10 20 30  40 50 60 70 80 90 100% No confidence<->completely confident  "How confident are you that you will not lose your balance or become unsteady when you . SABRA SABRA  Date tested 01/01/24  Walk around the house 80%  2. Walk up or down stairs 80%  3. Bend over and pick up a slipper from in front of a closet floor 60%  4. Reach for a small can off a shelf at eye level 90%  5. Stand on tip toes and reach for something above your head 0%  6. Stand on a chair and reach for something 40%  7. Sweep the floor 50%  8. Walk outside the house to a car parked in the driveway 90%  9. Get into or out of a car 80%  10. Walk across a parking lot to the mall 80%  11. Walk up or down a ramp 80%  12. Walk in a crowded mall where people rapidly walk past you 60%  13. Are bumped into by people as you walk through the mall 40%  14. Step onto or off of an escalator while you are holding onto the railing 50%  15. Step onto or off an escalator while holding onto parcels such that you cannot hold onto the railing 30%   16. Walk outside on icy sidewalks 0%  Total: #/16  57% (910/1600)       COGNITION: Overall cognitive status: Within functional limits for tasks assessed     SENSATION: Light touch: Impaired electricity down her RLE   MUSCLE LENGTH: Hamstrings: Not tested   Debby test: Not tested    POSTURE: rounded shoulders  PALPATION: Not performed    LUMBAR ROM:   AROM eval  Flexion 80%  Extension 100%  Right lateral flexion 100%  Left lateral flexion 100%  Right rotation 100%  Left rotation 100%   (Blank rows = not tested)  LOWER EXTREMITY ROM:     Active  Right eval Left eval  Hip flexion    Hip extension    Hip abduction    Hip adduction    Hip internal rotation    Hip external rotation    Knee flexion    Knee extension    Ankle dorsiflexion    Ankle plantarflexion    Ankle inversion    Ankle eversion     (Blank rows = not tested)  LOWER EXTREMITY MMT:    MMT Right eval Left eval  Hip flexion 4- 4  Hip extension    Hip abduction    Hip adduction    Hip internal rotation    Hip external rotation    Knee flexion 4 4  Knee extension 4 4  Ankle dorsiflexion 4 4  Ankle plantarflexion    Ankle inversion    Ankle eversion     (Blank rows = not tested)    FUNCTIONAL TESTS:  5 times sit to stand: 14 sec , 03/07/24:11 sec  30 seconds chair stand test: 10?30/25:  11 reps, 13 reps    Timed up and go (TUG): 15.38 sec with use of single point cane   6 minute walk test: 553 ft    10 meter walk test: 0.61 m/sec                 DGI:  13/26                       <=19/24  predictive of falls in the elderly     GAIT: Distance walked: 30 ft    Assistive device utilized: Walker - 2 wheeled Level of assistance: Modified independence Comments: Significant use of BUE support with  decrease step length bilaterally.    TREATMENT DATE:   PHYSICAL PERFORMANCE   5xSTS: 11 sec  30 sec chair stands 13 reps   TUG: 12.27 sec with 2WW,  15 sec with single point cane     [facility]  Activities balance confidence scale:   500 ft up and down hill with single point cane.   -intermittent contact guard assistance   -Decreased step length bilaterally with RLE<LLE     [name] EDUCATION:  Education details: Form and technique for correct performance of exercise and explanation about cut-offs for functional tests.  Person educated: [name] Education method: Explanation, Demonstration, Verbal cues, and Handouts Education comprehension: verbalized understanding, returned demonstration, and verbal cues required  HOME EXERCISE PROGRAM: Access Code: [email or web address] URL: [email or web address] Date: 02/29/2024 Prepared by: Toribio Servant  Program Notes Toe to heel on right foot with mirror in front of you 3 sets  x 20 reps 5-7 days per week    Exercises - Seated Slump Neural Tensioner  - 1 x daily - 7 x weekly - 1 sets - 10 reps - Seated Hamstring Stretch  - 1 x daily - 7 x weekly - 2-3 reps - 60 sec hold - Seated Hamstring Stretch (Mirrored)  - 1 x daily - 7 x weekly - 2-3 reps - 60 sec hold - Alternating Step Taps with Counter Support (Mirrored)  - 5-7 x weekly - 3 sets - 20 reps - Sit to Stand  - 3-4 x weekly - 3 sets - 10 reps - Standing Ankle Dorsiflexion with Table Support  - 3-4 x weekly - 3 sets - 10 reps - Standing March with Counter Support (Mirrored)  - 3-4 x weekly - 3 sets - 10 reps - Standing Knee Flexion AROM with Chair Support (Mirrored)  - 3-4 x weekly - 3 sets - 10 reps - Standing Hip Abduction with Counter Support (Mirrored)  - 3-4 x weekly - 3 sets - 10 reps - Long Sitting Ankle Plantar Flexion with Resistance (Mirrored)  - 3-4 x weekly - 3 sets - 15 reps  CLINICAL IMPRESSION:   Pt progressing towards goals with improvement in LE strength and endurance along with improvement in mobility that places her at an increased risk for falls. She also shows significant decrease in pain with ability to perform increased volume of activity without  using TENS. PT focused on therapeutic activity with walking with single point cane outside over uneven surface, which [name] was able to perform safely albeit with intermittent contact guard assistance. Pt is electing to take a break until from physical therapy for next several weeks to devote more attention to medical side of care.She plans on returning in mid December. She will continue to benefit from skilled PT to improve motor control and strength of lower extremities to improve dynamic balance and gait stability to avoid falling and to decrease level of assistive device to return to her prior level of function.   OBJECTIVE IMPAIRMENTS: Abnormal gait, decreased endurance, decreased ROM, decreased strength, impaired flexibility, impaired sensation, obesity, and pain.   ACTIVITY LIMITATIONS: carrying, lifting, bending, standing, squatting, stairs, dressing, hygiene/grooming, and locomotion level  PARTICIPATION LIMITATIONS: meal prep, cleaning, laundry, shopping, community activity, and yard work  PERSONAL FACTORS: Fitness, Past/current experiences, Time since onset of injury/illness/exacerbation, and 3+ comorbidities: h/o stroke with right sided deficits, L3-L5 decompression, HLD, HTN are also affecting [name]'s functional outcome.   REHAB POTENTIAL: Fair multiple co-morbidities and chronicity of condition  CLINICAL DECISION MAKING: Evolving/moderate complexity  EVALUATION  COMPLEXITY: Moderate   GOALS: Goals reviewed with [name]? No  SHORT TERM GOALS: Target date: 01/15/2024  [name] will demonstrate undestanding of home exercise plan by performing exercises correctly with evidence of good carry over with min to no verbal or tactile cues .   Baseline: NT  01/16/24: Performing Independently  Goal status: ACHIEVED     2.   Pt will increase by at least 40m (168ft) in order to demonstrate clinically significant improvement in cardiopulmonary endurance and community ambulation Baseline:  8/28: 553' with RW and unable to complete 6 minutes (stopped at 5 minutes)   Goal status: NOT MET     3.  [name] will show decreased risk of falling as evidence by decrease in self-selected gait speed by >= 0.12 m/sec.  Baseline: 8/28: 0.61 m/s Goal status: NOT MET    4.  Pt will decrease 5TSTS by at least 3 seconds to demonstrate a minimal clinically significant improvement in LE strength.  Baseline: 14 seconds  Goal status: NOT MET     LONG TERM GOALS: Target date: 03/25/2024  [name] will improve activities specific balance scale score >=67% as evidence of an improvement self-perception of function and to decrease risk for falls Mae et al., 2022).  Baseline: 57%  02/14/24: 33% 03/07/24: 44% (700/1600)   Goal status: ONGOING    2.  [name] will perform 5 x STS in <11.4 sec to demonstrate improve LE strength to decrease risk of falling (Bohannon, 2006). Baseline: 14 sec   02/05/24:  14 sec 03/07/24: 11 sec     Goal status: ACHIEVED    3.  [name] will complete >=12 reps on 30 sec chair stands to exhibit improved LE endurance to decrease risk of falling.  Baseline: 11 reps   02/05/24: 11 reps   03/07/24 13 reps  Goal status: ACHIEVED    4.  [name] will ambulate >=1000 ft in to show community ambulator status to be able to have aerobic endurance to return to fulfilling walking tasks at his job.  Baseline: 8/28: 553' with RW and unable to complete 6 minutes (stopped at 5 minutes)  02/05/24: 558 ft with 2WW   Goal status: ONGOING    5.  [name] will demonstrate reduced falls risk as evidenced by Dynamic Gait Index (DGI) >19/24 to decrease risk of falling.  Baseline: 13/24 with use of single point cane in RUE   02/19/24: 15/24-  mild for most, moderate for stairs and obstacle over.   Goal status: ONGOING     6.  [name] will perform TUG in <13.5 sec as evidence of improved mobility and decreased risk for falling [facility] et al., 2000) Baseline:  8/28: 15.38 seconds with RW    02/12/24:  14.47 sec with 2 [facility]  03/07/24: 12.27 sec with 2WWW 15 sec with SPC   Goal status: ACHIEVED    7. [name] will be able to ambulate safely with the least restrictive assistive device outside as  evidenced by decreased lateral sway, consistent stride length and step length, and with knowledge of when to take a break due to fatigue.  Baseline: Using 2WW or 4WW  03/07/24: Can safely use single point cane.  Goal status: ACHIEVED   8. [name] will perform in >=1.0 m./sec as evidence of improved mobility, decreased falls risk, and overall functional independence.  Baseline: 8/28: 0.61 m/s  02/12/24: 0.73 m/sec  Goal status: ONGOING   PLAN:  PT FREQUENCY: 1-2x/week  PT DURATION: 12 weeks  PLANNED INTERVENTIONS: 02835-  PT Re-evaluation, 97750- Physical Performance Testing, 97110-Therapeutic exercises, 97530- Therapeutic activity, [id number]- Neuromuscular re-education, [phone]- Self Care, 02859- Manual therapy, [phone]- Gait training, [phone]- Orthotic Initial, [phone]- Orthotic/Prosthetic subsequent, [phone]- Canalith repositioning, [phone]- Aquatic Therapy, [phone]- Electrical stimulation (unattended), [phone]- Electrical stimulation (manual), [id number]- Ultrasound, [phone] (1-2 muscles), [phone] (3+ muscles)- Dry Needling, [name]/Family education, Balance training, Stair training, Taping, Joint mobilization, Joint manipulation, Spinal manipulation, Spinal mobilization, Scar mobilization, Vestibular training, DME instructions, Cryotherapy, Moist heat, and Biofeedback.  PLAN FOR NEXT SESSION: , DGI retest, gait speed. Gait training with single point cane and dynamic balance exercises. Ongoing progression of OTAGO exercises: mini-squats. Step Ups on RLE if possible and work on walking with for improved mobility and cardovascular improvement  Toribio Servant PT, DPT  [facility] [address], [place], [address] Phone: [phone]   Fax:  [phone]

## 2024-03-08 ENCOUNTER — Other Ambulatory Visit: Payer: Self-pay

## 2024-03-15 ENCOUNTER — Other Ambulatory Visit: Payer: Self-pay

## 2024-03-15 DIAGNOSIS — E782 Mixed hyperlipidemia: Secondary | ICD-10-CM

## 2024-03-19 ENCOUNTER — Other Ambulatory Visit: Payer: Self-pay

## 2024-03-20 ENCOUNTER — Other Ambulatory Visit: Payer: Self-pay

## 2024-03-21 ENCOUNTER — Other Ambulatory Visit: Payer: Self-pay

## 2024-03-21 DIAGNOSIS — I10 Essential (primary) hypertension: Secondary | ICD-10-CM

## 2024-03-25 ENCOUNTER — Ambulatory Visit

## 2024-03-26 ENCOUNTER — Other Ambulatory Visit: Payer: Self-pay

## 2024-03-27 ENCOUNTER — Telehealth: Payer: Self-pay

## 2024-03-27 NOTE — Telephone Encounter (Signed)
 Received auth for scs trial with Dr. Marcelino. Jerel Blossom a staff message with information.

## 2024-03-27 NOTE — Telephone Encounter (Signed)
 open in error

## 2024-04-01 ENCOUNTER — Telehealth: Payer: Self-pay

## 2024-04-01 ENCOUNTER — Telehealth: Payer: Self-pay | Admitting: *Deleted

## 2024-04-01 ENCOUNTER — Ambulatory Visit (INDEPENDENT_AMBULATORY_CARE_PROVIDER_SITE_OTHER): Payer: 59 | Admitting: *Deleted

## 2024-04-01 VITALS — Ht 61.0 in | Wt 224.0 lb

## 2024-04-01 DIAGNOSIS — Z0000 Encounter for general adult medical examination without abnormal findings: Secondary | ICD-10-CM | POA: Diagnosis not present

## 2024-04-01 DIAGNOSIS — Z1231 Encounter for screening mammogram for malignant neoplasm of breast: Secondary | ICD-10-CM

## 2024-04-01 DIAGNOSIS — E1121 Type 2 diabetes mellitus with diabetic nephropathy: Secondary | ICD-10-CM

## 2024-04-01 NOTE — Progress Notes (Signed)
 Chief Complaint  Patient presents with   Medicare Wellness     Subjective:   Susan [name] is a 63 y.o. female who presents for a Medicare Annual Wellness Visit.  Allergies (verified) Wellbutrin  [bupropion ], Furosemide, and Hydrochlorothiazide    History: Past Medical History:  Diagnosis Date   ADD (attention deficit disorder)    Alcohol abuse    Anemia    Anxiety    Aortic atherosclerosis 02/20/2018   Chest CT Sept 2019   Arthritis    rheumatoid arthritis   Asthma    Bipolar disorder (HCC)    Centrilobular emphysema (HCC)    Chronic diarrhea 07/22/2022   Cocaine use disorder, moderate, in sustained remission (HCC)    Constipation    Degenerative disc disease at L5-S1 level 09/28/2016   See ortho note May 2018   Depression    bipolar, hx of suicide attempt   Diabetes mellitus, type 2 (HCC)    Diverticulitis 2013   DOE (dyspnea on exertion)    Drug use    Family history of adverse reaction to anesthesia    mom-delayed emergence   GERD (gastroesophageal reflux disease)    H/O suicide attempt    slit wrists   Hepatitis C 06/26/2014   treated with Harvoni   Hip pain    History of echocardiogram    a. 04/2019 Echo: EF >65%, nl RV fxn.   History of MRSA infection 2013   HLD (hyperlipidemia)    Hypertension    a. 06/2021 Renal Duplex: ? bilat RAS; b. 06/2021 CTA Abd: No signif RAS.   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   Status post total right knee replacement using cement 05/10/2016   Stroke (HCC) 07/05/2022   right arm,torso and right leg numbness   Tobacco use disorder    Tobacco use  disorder, continuous 06/26/2014   Vitamin B12 deficiency    Vitamin D  deficiency 04/09/2015   Past Surgical History:  Procedure Laterality Date   BILATERAL SALPINGOOPHORECTOMY     due to abnormal mass   BREAST BIOPSY Left    neg   BREAST SURGERY Left 20 yrs ago   CESAREAN SECTION     COLONOSCOPY     COLONOSCOPY WITH PROPOFOL  N/A 10/16/2017   Procedure: COLONOSCOPY WITH PROPOFOL ;  Surgeon: Therisa Bi, MD;  Location: [facility] ENDOSCOPY;  Service: Gastroenterology;  Laterality: N/A;   COLONOSCOPY WITH PROPOFOL  N/A 02/16/2021   Procedure: COLONOSCOPY WITH PROPOFOL ;  Surgeon: Therisa Bi, MD;  Location: [facility] ENDOSCOPY;  Service: Gastroenterology;  Laterality: N/A;   FORAMINOTOMY 1 LEVEL Right 10/10/2022   Procedure: RIGHT L4-5 LAMINOFORAMINOTOMY;  Surgeon: Clois Fret, MD;  Location: [facility] ORS;  Service: Neurosurgery;  Laterality: Right;   HERNIA REPAIR  06/2011, July 2014   Ventral wall repair with Physiomesh   HERNIA REPAIR     2nd.vental wall repair   JOINT REPLACEMENT Right    knee   LUMBAR LAMINECTOMY/ DECOMPRESSION WITH MET-RX N/A 10/12/2022   Procedure: LUMBAR LAMINECTOMY/ DECOMPRESSION WITH MET-RX;  Surgeon: Clois Fret, MD;  Location: [facility] ORS;  Service: Neurosurgery;  Laterality: N/A;   TONSILLECTOMY     TOTAL HIP ARTHROPLASTY Left 07/23/2019   Procedure: TOTAL HIP ARTHROPLASTY;  Surgeon: Edie Norleen PARAS, MD;  Location: [facility] ORS;  Service: Orthopedics;  Laterality: Left;   TOTAL KNEE ARTHROPLASTY Right 05/10/2016   Procedure: TOTAL KNEE ARTHROPLASTY;  Surgeon: Norleen PARAS Edie, MD;  Location: [facility] ORS;  Service: Orthopedics;  Laterality: Right;   TUBAL LIGATION     Family History  Problem Relation Age of Onset   Arthritis Mother    Asthma Mother    Mental illness Mother    Thyroid  disease Mother    COPD Mother    Heart disease Mother    Congestive Heart Failure Mother    Alcohol abuse Mother    Eating disorder Mother    Bipolar disorder Mother    Alcohol abuse Father     Heart attack Father    Alcohol abuse Sister    Drug abuse Sister    Mental illness Sister    Mental illness Sister    Fibromyalgia Sister    Obesity Sister    Pneumonia Sister    Depression Sister    Mental illness Sister    Alcohol abuse Sister    Drug abuse Sister    Arthritis Brother    Mental illness Brother    Cancer Brother        non-hodkins lymphoma   Drug abuse Daughter    Drug abuse Son    Alcohol abuse Son    Drug abuse Son    Alcohol abuse Son    Diabetes Neg Hx    Stroke Neg Hx    Breast cancer Neg Hx    Thyroid  cancer Neg Hx    Social History   Occupational History   Occupation: disabled  Tobacco Use   Smoking status: Every Day    Current packs/day: 0.50    Average packs/day: 0.5 packs/day for 48.9 years (24.4 ttl pk-yrs)    Types: Cigarettes    Start date: 1977   Smokeless tobacco: Never   Tobacco comments:    Smokes about 3 cigarettes a day, vapes daily  Vaping Use   Vaping status: Every Day   Substances: Nicotine   Substance and Sexual Activity   Alcohol use: No    Alcohol/week: 0.0 standard drinks of 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   Drug use: No    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   Sexual activity: Not Currently   Tobacco Counseling Ready to quit: No Counseling given: Not Answered Tobacco comments: Smokes about 3 cigarettes a day, vapes daily  SDOH Screenings   Food Insecurity: No Food Insecurity (04/01/2024)  Housing: Low Risk  (04/01/2024)  Transportation Needs: No Transportation Needs (04/01/2024)  Utilities: Not At Risk (04/01/2024)  Alcohol Screen: Low Risk  (04/01/2024)  Depression (PHQ2-9): Low Risk  (04/01/2024)  Financial Resource Strain: Low Risk  (04/01/2024)  Physical Activity: Inactive (04/01/2024)  Social Connections: Moderately Isolated (04/01/2024)  Stress: No Stress Concern Present (04/01/2024)  Tobacco Use: High Risk (04/01/2024)  Health Literacy: Adequate Health Literacy  (04/01/2024)   See flowsheets for full screening details  Depression Screen PHQ 2 & 9 Depression Scale- Over the past 2 weeks, how often have you been bothered by any of the following problems? Little interest or pleasure in doing things: 0 Feeling down, depressed, or hopeless (PHQ Adolescent also includes...irritable): 1 PHQ-2 Total Score: 1 Trouble falling or staying asleep, or sleeping too  much: 2 Feeling tired or having little energy: 1 Poor appetite or overeating (PHQ Adolescent also includes...weight loss): 0 Feeling bad about yourself - or that you are a failure or have let yourself or your family down: 0 Trouble concentrating on things, such as reading the newspaper or watching television (PHQ Adolescent also includes...like school work): 0 Moving or speaking so slowly that other people could have noticed. Or the opposite - being so fidgety or restless that you have been moving around a lot more than usual: 0 Thoughts that you would be better off dead, or of hurting yourself in some way: 0 PHQ-9 Total Score: 4 If you checked off any problems, how difficult have these problems made it for you to do your work, take care of things at home, or get along with other people?: Somewhat difficult     Goals Addressed             This Visit's Progress    Patient Stated       Wants to lose weight       Visit info / Clinical Intake: Medicare Wellness Visit Type:: Subsequent Annual Wellness Visit Persons participating in visit:: patient Medicare Wellness Visit Mode:: Telephone If telephone:: video declined Because this visit was a virtual/telehealth visit:: pt reported vitals If Telephone or Video please confirm:: I connected with the patient using audio enabled telemedicine application and verified that I am speaking with the correct person using two identifiers; I discussed the limitations of evaluation and management by telemedicine; The patient expressed understanding and agreed  to proceed Patient Location:: Home Provider Location:: Office/Home Information given by:: patient Interpreter Needed?: No Pre-visit prep was completed: yes AWV questionnaire completed by patient prior to visit?: no Living arrangements:: (!) lives alone Patient's Overall Health Status Rating: good Typical amount of pain: (!) a lot Does pain affect daily life?: (!) yes Are you currently prescribed opioids?: (!) yes  Dietary Habits and Nutritional Risks How many meals a day?: 2 Eats fruit and vegetables daily?: yes Most meals are obtained by: preparing own meals In the last 2 weeks, have you had any of the following?: none Diabetic:: (!) yes Any non-healing wounds?: no How often do you check your BS?: 0 Would you like to be referred to a Nutritionist or for Diabetic Management? : yes (Note sent to PCP)  Functional Status Activities of Daily Living (to include ambulation/medication): Independent Ambulation: Independent with device- listed below Home Assistive Devices/Equipment: Walker (specify Type) Medication Administration: Independent Home Management: Independent Manage your own finances?: yes Primary transportation is: driving Concerns about vision?: (!) yes (blurry vision) Concerns about hearing?: no  Fall Screening Falls in the past year?: 1 Number of falls in past year: 1 Was there an injury with Fall?: 0 Fall Risk Category Calculator: 2 Patient Fall Risk Level: Moderate Fall Risk  Fall Risk Patient at Risk for Falls Due to: History of fall(s); Impaired balance/gait Fall risk Follow up: Falls evaluation completed; Falls prevention discussed  Home and Transportation Safety: All rugs have non-skid backing?: (!) no (discussed adding non-skid backing) All stairs or steps have railings?: yes Grab bars in the bathtub or shower?: yes Have non-skid surface in bathtub or shower?: yes Good home lighting?: yes Regular seat belt use?: yes Hospital stays in the last year::  no  Cognitive Assessment Difficulty concentrating, remembering, or making decisions? : yes Will 6CIT or Mini Cog be Completed: yes What year is it?: 0 points What month is it?: 0 points Give patient  an address phrase to remember (5 components): [address] [place] About what time is it?: 0 points Count backwards from 20 to 1: 0 points Say the months of the year in reverse: 0 points Repeat the address phrase from earlier: 0 points 6 CIT Score: 0 points  Advance Directives (For Healthcare) Does Patient Have a Medical Advance Directive?: No Would patient like information on creating a medical advance directive?: No - Patient declined  Reviewed/Updated  Reviewed/Updated: Reviewed All (Medical, Surgical, Family, Medications, Allergies, Care Teams, Patient Goals)        Objective:    Today's Vitals   04/01/24 0933  Weight: 224 lb (101.6 kg)  Height: 5' 1 (1.549 m)   Body mass index is 42.32 kg/m.  Current Medications (verified) Outpatient Encounter Medications as of 04/01/2024  Medication Sig   acetaminophen  (TYLENOL ) 500 MG tablet Take 1,000 mg by mouth 2 (two) times daily.   amLODipine  (NORVASC ) 5 MG tablet Take 1 tablet (5 mg total) by mouth daily.   clotrimazole  (LOTRIMIN ) 1 % cream Apply 1 application  topically 2 (two) times daily as needed.   cyanocobalamin  (VITAMIN B12) 100 MCG tablet Take 1 tablet (100 mcg total) by mouth daily.   cyclobenzaprine  (FLEXERIL ) 5 MG tablet Take 0.5-1 tablets (2.5-5 mg total) by mouth 2 (two) times daily as needed.   DULoxetine  (CYMBALTA ) 20 MG capsule Take 1 capsule (20 mg total) by mouth daily. *Take with 30 mg capsule*   DULoxetine  (CYMBALTA ) 30 MG capsule Take 1 capsule (30 mg total) by mouth daily. *Take with 20 mg capsule.*   gabapentin  (NEURONTIN ) 300 MG capsule Take 2 capsules (600 mg total) by mouth 2 (two) times daily.   HYDROcodone -acetaminophen  (NORCO/VICODIN) 5-325 MG tablet Take 1 tablet by mouth daily as needed for  moderate pain.   lurasidone  (LATUDA ) 20 MG TABS tablet Take 1 tablet (20 mg total) by mouth daily with supper.   naloxone (NARCAN) nasal spray 4 mg/0.1 mL Place into the nose.   rosuvastatin  (CRESTOR ) 20 MG tablet TAKE 1 TABLET(20 MG) BY MOUTH DAILY   Semaglutide , 2 MG/DOSE, (OZEMPIC , 2 MG/DOSE,) 8 MG/3ML SOPN Inject 2 mg into the skin once a week.   Semaglutide , 2 MG/DOSE, (OZEMPIC , 2 MG/DOSE,) 8 MG/3ML SOPN Inject 2mg  under the skin once a week   Semaglutide , 2 MG/DOSE, (OZEMPIC , 2 MG/DOSE,) 8 MG/3ML SOPN Inject 2 mg into the skin once a week.   telmisartan  (MICARDIS ) 80 MG tablet Take 1 tablet (80 mg total) by mouth daily.   traZODone  (DESYREL ) 50 MG tablet Take 1 tablet by mouth at bedtime as needed for sleep. for sleep (Patient taking differently: Take 50 mg by mouth at bedtime. for sleep)   vitamin B-12 (CYANOCOBALAMIN ) 100 MCG tablet Take 1 tablet (100 mcg total) by mouth daily.   aspirin  EC 81 MG tablet Take 1 tablet (81 mg total) by mouth daily. *Swallow whole* (Patient not taking: Reported on 04/01/2024)   bisacodyl  (ONELAX) 10 MG suppository Unwrap and insert 1 suppository (10 mg total) rectally as needed for moderate constipation. *If no bowel movement with morning colace.* (Patient not taking: Reported on 04/01/2024)   carvedilol  (COREG ) 12.5 MG tablet Take 1 tablet (12.5 mg total) by mouth 2 (two) times daily with a meal. (Patient not taking: Reported on 04/01/2024)   Vibegron  (GEMTESA ) 75 MG TABS Take 1 tablet (75 mg total) by mouth daily. (Patient not taking: Reported on 04/01/2024)   No facility-administered encounter medications on file as of 04/01/2024.  Hearing/Vision screen Hearing Screening - Comments:: No issues Vision Screening - Comments:: Glasses, [employer], [place], up to date. Patient will call and schedule a diabetic eye exam and was given several names of eye doctors .  Immunizations and Health Maintenance Health Maintenance  Topic Date Due    OPHTHALMOLOGY EXAM  Never done   Zoster Vaccines- Shingrix (1 of 2) Never done   Diabetic kidney evaluation - Urine ACR  10/15/2020   Colonoscopy  02/16/2022   DTaP/Tdap/Td (2 - Td or Tdap) 03/09/2022   Cervical Cancer Screening (Pap smear)  03/24/2022   Mammogram  06/09/2022   FOOT EXAM  06/29/2023   Lung Cancer Screening  10/17/2023   COVID-19 Vaccine (5 - 2025-26 season) 01/08/2024   Influenza Vaccine  08/06/2024 (Originally 12/08/2023)   HEMOGLOBIN A1C  06/05/2024   Diabetic kidney evaluation - eGFR measurement  12/03/2024   Medicare Annual Wellness (AWV)  04/01/2025   Pneumococcal Vaccine: 50+ Years  Completed   Hepatitis B Vaccines 19-59 Average Risk  Completed   Hepatitis C Screening  Completed   HIV Screening  Completed   HPV VACCINES  Aged Out   Meningococcal B Vaccine  Aged Out        Assessment/Plan:  This is a routine wellness examination for [place].  Patient Care Team: Bair, Kalpana, MD as PCP - General (Family Medicine) Darliss Rogue, MD as PCP - Cardiology (Cardiology) Avanell Katz, MD as Referring Physician (Physical Medicine and Rehabilitation) Maree Jannett POUR, MD (Neurology) Solum, Therisa HERO, MD as Consulting Physician (Endocrinology) Jaye Fallow, MD as Referring Physician (Ophthalmology) Kip Lynwood Double, PA-C as Physician Assistant (Physician Assistant) Poggi, Norleen PARAS, MD as Consulting Physician (Orthopedic Surgery) Neill Boas, DPM (Podiatry) Coby Height, MD as Consulting Physician (Psychiatry) Micah Ludwig, LCSW as Social Worker (Psychology) Hester Alm BROCKS, MD (Dermatology) Therisa Bi, MD as Consulting Physician (Gastroenterology)  I have personally reviewed and noted the following in the patient's chart:   Medical and social history Use of alcohol, tobacco or illicit drugs  Current medications and supplements including opioid prescriptions. Functional ability and status Nutritional status Physical activity Advanced  directives List of other physicians Hospitalizations, surgeries, and ER visits in previous 12 months Vitals Screenings to include cognitive, depression, and falls Referrals and appointments  No orders of the defined types were placed in this encounter.  In addition, I have reviewed and discussed with patient certain preventive protocols, quality metrics, and best practice recommendations. A written personalized care plan for preventive services as well as general preventive health recommendations were provided to patient.   Angeline Fredericks, LPN   [id number]   Return in 1 year (on 04/01/2025).  After Visit Summary: (MyChart) Due to this being a telephonic visit, the after visit summary with patients personalized plan was offered to patient via MyChart   Nurse Notes: Patient stated that she will call and schedule her colonoscopy. Discussed the need to update tetanus and shingles vaccines. Patient was given the telephone number to call and schedule her lung cancer screening. Order placed for mammogram. Patient needs a foot exam and lab work at next office visit.

## 2024-04-01 NOTE — Patient Instructions (Addendum)
 Susan [name],  Thank you for taking the time for your Medicare Wellness Visit. I appreciate your continued commitment to your health goals. Please review the care plan we discussed, and feel free to reach out if I can assist you further.  Please note that Annual Wellness Visits do not include a physical exam. Some assessments may be limited, especially if the visit was conducted virtually. If needed, we may recommend an in-person follow-up with your provider.  Ongoing Care Seeing your primary care provider every 3 to 6 months helps us  monitor your health and provide consistent, personalized care.   Remember to call and schedule your mammogram, colonoscopy, lung cancer screening  and a diabetic eye exam. Make sure that you update your tetanus and shingles vaccines. You have an order for:  []   2D Mammogram  [x]   3D Mammogram  []   Bone Density     Please call for appointment:  [facility]  [address] [place] [address] [phone]   Make sure to wear two-piece clothing.  No lotions, powders, or deodorants the day of the appointment. Make sure to bring picture ID and insurance card.  Bring list of medications you are currently taking including any supplements.    Managing Pain Without Opioids Opioids are strong medicines used to treat moderate to severe pain. For some people, especially those who have long-term (chronic) pain, opioids may not be the best choice for pain management due to: Side effects like nausea, constipation, and sleepiness. The risk of addiction (opioid use disorder). The longer you take opioids, the greater your risk of addiction. Pain that lasts for more than 3 months is called chronic pain. Managing chronic pain usually requires more than one approach and is often provided by a team of health care providers working together (multidisciplinary approach). Pain management may be done at a pain management center  or pain clinic. How to manage pain without the use of opioids Use non-opioid medicines Non-opioid medicines for pain may include: Over-the-counter or prescription non-steroidal anti-inflammatory drugs (NSAIDs). These may be the first medicines used for pain. They work well for muscle and bone pain, and they reduce swelling. Acetaminophen . This over-the-counter medicine may work well for milder pain but not swelling. Antidepressants. These may be used to treat chronic pain. A certain type of antidepressant (tricyclics) is often used. These medicines are given in lower doses for pain than when used for depression. Anticonvulsants. These are usually used to treat seizures but may also reduce nerve (neuropathic) pain. Muscle relaxants. These relieve pain caused by sudden muscle tightening (spasms). You may also use a pain medicine that is applied to the skin as a patch, cream, or gel (topical analgesic), such as a numbing medicine. These may cause fewer side effects than medicines taken by mouth. Do certain therapies as directed Some therapies can help with pain management. They include: Physical therapy. You will do exercises to gain strength and flexibility. A physical therapist may teach you exercises to move and stretch parts of your body that are weak, stiff, or painful. You can learn these exercises at physical therapy visits and practice them at home. Physical therapy may also involve: Massage. Heat wraps or applying heat or cold to affected areas. Electrical signals that interrupt pain signals (transcutaneous electrical nerve stimulation, TENS). Weak lasers that reduce pain and swelling (low-level laser therapy). Signals from your body that help you learn to regulate pain (biofeedback). Occupational therapy. This helps you to  learn ways to function at home and work with less pain. Recreational therapy. This involves trying new activities or hobbies, such as a physical activity or  drawing. Mental health therapy, including: Cognitive behavioral therapy (CBT). This helps you learn coping skills for dealing with pain. Acceptance and commitment therapy (ACT) to change the way you think and react to pain. Relaxation therapies, including muscle relaxation exercises and mindfulness-based stress reduction. Pain management counseling. This may be individual, family, or group counseling.  Receive medical treatments Medical treatments for pain management include: Nerve block injections. These may include a pain blocker and anti-inflammatory medicines. You may have injections: Near the spine to relieve chronic back or neck pain. Into joints to relieve back or joint pain. Into nerve areas that supply a painful area to relieve body pain. Into muscles (trigger point injections) to relieve some painful muscle conditions. A medical device placed near your spine to help block pain signals and relieve nerve pain or chronic back pain (spinal cord stimulation device). Acupuncture. Follow these instructions at home Medicines Take over-the-counter and prescription medicines only as told by your health care provider. If you are taking pain medicine, ask your health care providers about possible side effects to watch out for. Do not drive or use heavy machinery while taking prescription opioid pain medicine. Lifestyle  Do not use drugs or alcohol to reduce pain. If you drink alcohol, limit how much you have to: 0-1 drink a day for women who are not pregnant. 0-2 drinks a day for men. Know how much alcohol is in a drink. In the [place] one drink equals one 12 oz bottle of beer (355 mL), one 5 oz glass of wine (148 mL), or one 1 oz glass of hard liquor (44 mL). Do not use any products that contain nicotine  or tobacco. These products include cigarettes, chewing tobacco, and vaping devices, such as e-cigarettes. If you need help quitting, ask your health care provider. Eat a healthy diet and  maintain a healthy weight. Poor diet and excess weight may make pain worse. Eat foods that are high in fiber. These include fresh fruits and vegetables, whole grains, and beans. Limit foods that are high in fat and processed sugars, such as fried and sweet foods. Exercise regularly. Exercise lowers stress and may help relieve pain. Ask your health care provider what activities and exercises are safe for you. If your health care provider approves, join an exercise class that combines movement and stress reduction. Examples include yoga and tai chi. Get enough sleep. Lack of sleep may make pain worse. Lower stress as much as possible. Practice stress reduction techniques as told by your therapist. General instructions Work with all your pain management providers to find the treatments that work best for you. You are an important member of your pain management team. There are many things you can do to reduce pain on your own. Consider joining an online or in-person support group for people who have chronic pain. Keep all follow-up visits. This is important. Where to find more information You can find more information about managing pain without opioids from: American Academy of Pain Medicine: painmed.org Institute for Chronic Pain: instituteforchronicpain.org American Chronic Pain Association: theacpa.org Contact a health care provider if: You have side effects from pain medicine. Your pain gets worse or does not get better with treatments or home therapy. You are struggling with anxiety or depression. Summary Many types of pain can be managed without opioids. Chronic pain may respond better to  pain management without opioids. Pain is best managed when you and a team of health care providers work together. Pain management without opioids may include non-opioid medicines, medical treatments, physical therapy, mental health therapy, and lifestyle changes. Tell your health care providers if your  pain gets worse or is not being managed well enough. This information is not intended to replace advice given to you by your health care provider. Make sure you discuss any questions you have with your health care provider. Document Revised: 08/05/2020 Document Reviewed: 08/05/2020 Elsevier Patient Education  2024 [employer]  Referrals If a referral was made during today's visit and you haven't received any updates within two weeks, please contact the referred provider directly to check on the status.  Recommended Screenings:  Health Maintenance  Topic Date Due   Eye exam for diabetics  Never done   Zoster (Shingles) Vaccine (1 of 2) Never done   Yearly kidney health urinalysis for diabetes  10/15/2020   Colon Cancer Screening  02/16/2022   DTaP/Tdap/Td vaccine (2 - Td or Tdap) 03/09/2022   Pap Smear  03/24/2022   Breast Cancer Screening  06/09/2022   Complete foot exam   06/29/2023   Screening for Lung Cancer  10/17/2023   Hemoglobin A1C  06/05/2024   COVID-19 Vaccine (6 - Pfizer risk 2025-26 season) 09/17/2024   Yearly kidney function blood test for diabetes  12/03/2024   Medicare Annual Wellness Visit  04/01/2025   Pneumococcal Vaccine for age over 23  Completed   Flu Shot  Completed   Hepatitis B Vaccine  Completed   Hepatitis C Screening  Completed   HIV Screening  Completed   HPV Vaccine  Aged Out   Meningitis B Vaccine  Aged Out       04/01/2024    9:40 AM  Advanced Directives  Does Patient Have a Medical Advance Directive? No  Would patient like information on creating a medical advance directive? No - Patient declined    Vision: Annual vision screenings are recommended for early detection of glaucoma, cataracts, and diabetic retinopathy. These exams can also reveal signs of chronic conditions such as diabetes and high blood pressure.  Dental: Annual dental screenings help detect early signs of oral cancer, gum disease, and other conditions linked to overall  health, including heart disease and diabetes.  Please see the attached documents for additional preventive care recommendations.

## 2024-04-01 NOTE — Telephone Encounter (Signed)
 1. 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  - Amb Referral to Nutrition and Diabetic Education. Mychart message to patient sent.   Luke Shade, MD

## 2024-04-01 NOTE — Telephone Encounter (Signed)
 I received a message via Teams from Angeline Fredericks, LPN, requesting we reschedule patient's appointment with Dr. Luke Shade from 03/25/2024, which was cancelled due to Dr. Shade being out of the office.  Patient's appointment was a three month follow-up for diabetes and recurrent urinary infection.  I spoke with Doyce Croak, CMA, regarding how Dr. Shade would like for us  to reschedule visit, given availability.  Destiny suggested I send a note so she can check with Dr. Shade.

## 2024-04-01 NOTE — Telephone Encounter (Signed)
 Performed [facility] Patient stated that she would be interested in a referral to a nutritionist for diabetic education. Patient stated that she prefers seeing someone in [place].

## 2024-04-01 NOTE — Telephone Encounter (Signed)
 04/17/24, 04/22/24 4 PM is available. Please reach out to patient to offer appointment for any of those dates.   Luke Shade, MD

## 2024-04-02 ENCOUNTER — Other Ambulatory Visit: Payer: Self-pay

## 2024-04-02 ENCOUNTER — Other Ambulatory Visit: Payer: Self-pay | Admitting: Psychiatry

## 2024-04-02 DIAGNOSIS — F3176 Bipolar disorder, in full remission, most recent episode depressed: Secondary | ICD-10-CM

## 2024-04-03 ENCOUNTER — Other Ambulatory Visit: Payer: Self-pay

## 2024-04-03 ENCOUNTER — Other Ambulatory Visit (HOSPITAL_COMMUNITY): Payer: Self-pay

## 2024-04-03 MED ORDER — LURASIDONE HCL 20 MG PO TABS
20.0000 mg | ORAL_TABLET | Freq: Every day | ORAL | 2 refills | Status: AC
  Filled 2024-04-12: qty 30, 30d supply, fill #0

## 2024-04-09 ENCOUNTER — Ambulatory Visit: Admitting: Cardiology

## 2024-04-12 ENCOUNTER — Other Ambulatory Visit: Payer: Self-pay

## 2024-04-13 ENCOUNTER — Other Ambulatory Visit (HOSPITAL_COMMUNITY): Payer: Self-pay

## 2024-04-15 ENCOUNTER — Inpatient Hospital Stay: Admission: RE | Admit: 2024-04-15 | Discharge: 2024-04-15

## 2024-04-15 DIAGNOSIS — Z1231 Encounter for screening mammogram for malignant neoplasm of breast: Secondary | ICD-10-CM

## 2024-04-16 ENCOUNTER — Other Ambulatory Visit (HOSPITAL_COMMUNITY): Payer: Self-pay

## 2024-04-16 ENCOUNTER — Other Ambulatory Visit: Payer: Self-pay

## 2024-04-21 ENCOUNTER — Ambulatory Visit: Payer: Self-pay

## 2024-04-22 ENCOUNTER — Ambulatory Visit: Admitting: Urology

## 2024-04-22 VITALS — BP 122/65 | HR 75

## 2024-04-22 DIAGNOSIS — N390 Urinary tract infection, site not specified: Secondary | ICD-10-CM

## 2024-04-22 DIAGNOSIS — R3915 Urgency of urination: Secondary | ICD-10-CM

## 2024-04-22 DIAGNOSIS — R350 Frequency of micturition: Secondary | ICD-10-CM

## 2024-04-22 MED ORDER — ESTRADIOL 0.01 % VA CREA: TOPICAL_CREAM | VAGINAL | 12 refills | Status: AC

## 2024-04-22 NOTE — Progress Notes (Signed)
 04/22/2024 2:20 PM   Susan [name] 06/12/1960 [id number]  Referring provider: Abbey Bruckner, MD [address] [place],  [place] [address]  No chief complaint on file.   HPI: Patient for me-I reviewed her prior notes, labs, imaging.  1) OAB-urgency, nocturia and urge incontinence worse CVA in 2024 and back surgery in 2024.  Tried oxybutynin  10 mg XL.  She also has some straining to void and positional voiding.  She has constipation.  She is on duloxetine .  Behavioral therapy discussed.  2) recurrent UTI-she had dysuria.  Urine culture positive for Klebsiella in July and August 2025.  3) right renal cyst-noted in evaluation for RAS.  2023 CTA revealed an indeterminate density lesion of the right lower pole 7 mm.  Follow-up September 2023 MRI demonstrated multiple simple cysts along with hemorrhagic and proteinaceous cysts of the left superior and right lower pole.  Bosniak 1 and 2.  Today, seen for the above.  She tried Gemtesa  last visit. She has intermittent stream but feels empty. No SUI. No SUI. No dysuria or gross hematuria.    PMH: Past Medical History:  Diagnosis Date   ADD (attention deficit disorder)    Alcohol abuse    Anemia    Anxiety    Aortic atherosclerosis 02/20/2018   Chest CT Sept 2019   Arthritis    rheumatoid arthritis   Asthma    Bipolar disorder (HCC)    Centrilobular emphysema (HCC)    Chronic diarrhea 07/22/2022   Cocaine use disorder, moderate, in sustained remission (HCC)    Constipation    Degenerative disc disease at L5-S1 level 09/28/2016   See ortho note May 2018   Depression    bipolar, hx of suicide attempt   Diabetes mellitus, type 2 (HCC)    Diverticulitis 2013   DOE (dyspnea on exertion)    Drug use    Family history of adverse reaction to anesthesia    mom-delayed emergence   GERD (gastroesophageal reflux disease)    H/O suicide attempt    slit wrists   Hepatitis C 06/26/2014   treated with Harvoni   Hip pain    History  of echocardiogram    a. 04/2019 Echo: EF >65%, nl RV fxn.   History of MRSA infection 2013   HLD (hyperlipidemia)    Hypertension    a. 06/2021 Renal Duplex: ? bilat RAS; b. 06/2021 CTA Abd: No signif RAS.   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   Status post total right knee replacement using cement 05/10/2016   Stroke (HCC) 07/05/2022   right arm,torso and right leg numbness   Tobacco use disorder    Tobacco use disorder, continuous 06/26/2014   Vitamin B12 deficiency    Vitamin D  deficiency 04/09/2015    Surgical History: Past Surgical History:  Procedure Laterality Date   BILATERAL SALPINGOOPHORECTOMY     due to abnormal mass   BREAST BIOPSY Left    neg   BREAST SURGERY Left 20 yrs ago   CESAREAN SECTION     COLONOSCOPY  COLONOSCOPY WITH PROPOFOL  N/A 10/16/2017   Procedure: COLONOSCOPY WITH PROPOFOL ;  Surgeon: Therisa Bi, MD;  Location: [facility] ENDOSCOPY;  Service: Gastroenterology;  Laterality: N/A;   COLONOSCOPY WITH PROPOFOL  N/A 02/16/2021   Procedure: COLONOSCOPY WITH PROPOFOL ;  Surgeon: Therisa Bi, MD;  Location: [facility] ENDOSCOPY;  Service: Gastroenterology;  Laterality: N/A;   FORAMINOTOMY 1 LEVEL Right 10/10/2022   Procedure: RIGHT L4-5 LAMINOFORAMINOTOMY;  Surgeon: Clois Fret, MD;  Location: [facility] ORS;  Service: Neurosurgery;  Laterality: Right;   HERNIA REPAIR  06/2011, July 2014   Ventral wall repair with Physiomesh   HERNIA REPAIR     2nd.vental wall repair   JOINT REPLACEMENT Right    knee   LUMBAR LAMINECTOMY/ DECOMPRESSION WITH MET-RX N/A 10/12/2022   Procedure: LUMBAR LAMINECTOMY/  DECOMPRESSION WITH MET-RX;  Surgeon: Clois Fret, MD;  Location: [facility] ORS;  Service: Neurosurgery;  Laterality: N/A;   TONSILLECTOMY     TOTAL HIP ARTHROPLASTY Left 07/23/2019   Procedure: TOTAL HIP ARTHROPLASTY;  Surgeon: Edie Norleen PARAS, MD;  Location: [facility] ORS;  Service: Orthopedics;  Laterality: Left;   TOTAL KNEE ARTHROPLASTY Right 05/10/2016   Procedure: TOTAL KNEE ARTHROPLASTY;  Surgeon: Norleen PARAS Edie, MD;  Location: [facility] ORS;  Service: Orthopedics;  Laterality: Right;   TUBAL LIGATION      Home Medications:  Allergies as of 04/22/2024       Reactions   Wellbutrin  [bupropion ]    Patient reports made  deathly sick  years ago at appointment 03/18/20.   Furosemide Other (See Comments)   Electrolyte imbalance Other Reaction(s): Other (See Comments) Electrolyte imbalance Electrolyte imbalance Electrolyte imbalance    Electrolyte imbalance   Hydrochlorothiazide  Other (See Comments)   Electrolyte imbalance Other Reaction(s): Dizziness, Other (See Comments)        Medication List        Accurate as of April 22, 2024  2:20 PM. If you have any questions, ask your nurse or doctor.          acetaminophen  500 MG tablet Commonly known as: TYLENOL  Take 1,000 mg by mouth 2 (two) times daily.   amLODipine  5 MG tablet Commonly known as: NORVASC  Take 1 tablet (5 mg total) by mouth daily.   aspirin  EC 81 MG tablet Take 1 tablet (81 mg total) by mouth daily. *Swallow whole*   bisacodyl  10 MG suppository Commonly known as: OneLAX Unwrap and insert 1 suppository (10 mg total) rectally as needed for moderate constipation. *If no bowel movement with morning colace.*   carvedilol  12.5 MG tablet Commonly known as: COREG  Take 1 tablet (12.5 mg total) by mouth 2 (two) times daily with a meal.   clotrimazole  1 % cream Commonly known as: LOTRIMIN  Apply 1 application  topically 2 (two) times daily as needed.   cyclobenzaprine  5 MG tablet Commonly known as: FLEXERIL  Take  0.5-1 tablets (2.5-5 mg total) by mouth 2 (two) times daily as needed.   DULoxetine  20 MG capsule Commonly known as: CYMBALTA  Take 1 capsule (20 mg total) by mouth daily. *Take with 30 mg capsule*   DULoxetine  30 MG capsule Commonly known as: CYMBALTA  Take 1 capsule (30 mg total) by mouth daily. *Take with 20 mg capsule.*   gabapentin  300 MG capsule Commonly known as: NEURONTIN  Take 2 capsules (600 mg total) by mouth 2 (two) times daily.   Gemtesa  75 MG Tabs Generic drug: Vibegron  Take 1 tablet (75 mg total) by mouth daily.   HYDROcodone -acetaminophen  5-325 MG tablet Commonly known as: NORCO/VICODIN Take 1 tablet by mouth  daily as needed for moderate pain.   lurasidone  20 MG Tabs tablet Commonly known as: LATUDA  Take 1 tablet (20 mg total) by mouth daily with supper.   naloxone 4 MG/0.1ML Liqd nasal spray kit Commonly known as: NARCAN Place into the nose.   Ozempic  (2 MG/DOSE) 8 MG/3ML Sopn Generic drug: Semaglutide  (2 MG/DOSE) Inject 2 mg into the skin once a week.   Ozempic  (2 MG/DOSE) 8 MG/3ML Sopn Generic drug: Semaglutide  (2 MG/DOSE) Inject 2 mg into the skin once a week.   Ozempic  (2 MG/DOSE) 8 MG/3ML Sopn Generic drug: Semaglutide  (2 MG/DOSE) Inject 2mg  under the skin once a week   rosuvastatin  20 MG tablet Commonly known as: CRESTOR  TAKE 1 TABLET(20 MG) BY MOUTH DAILY   telmisartan  80 MG tablet Commonly known as: MICARDIS  Take 1 tablet (80 mg total) by mouth daily.   traZODone  50 MG tablet Commonly known as: DESYREL  Take 1 tablet by mouth at bedtime as needed for sleep. for sleep What changed: when to take this   vitamin B-12 100 MCG tablet Commonly known as: CYANOCOBALAMIN  Take 1 tablet (100 mcg total) by mouth daily.   cyanocobalamin  100 MCG tablet Commonly known as: VITAMIN B12 Take 1 tablet (100 mcg total) by mouth daily.        Allergies: Allergies[1]  Family History: Family History  Problem Relation Age of Onset   Arthritis Mother     Asthma Mother    Mental illness Mother    Thyroid  disease Mother    COPD Mother    Heart disease Mother    Congestive Heart Failure Mother    Alcohol abuse Mother    Eating disorder Mother    Bipolar disorder Mother    Alcohol abuse Father    Heart attack Father    Alcohol abuse Sister    Drug abuse Sister    Mental illness Sister    Mental illness Sister    Fibromyalgia Sister    Obesity Sister    Pneumonia Sister    Depression Sister    Mental illness Sister    Alcohol abuse Sister    Drug abuse Sister    Arthritis Brother    Mental illness Brother    Cancer Brother        non-hodkins lymphoma   Drug abuse Daughter    Drug abuse Son    Alcohol abuse Son    Drug abuse Son    Alcohol abuse Son    Diabetes Neg Hx    Stroke Neg Hx    Breast cancer Neg Hx    Thyroid  cancer Neg Hx     Social History:  reports that she has been smoking cigarettes. She started smoking about 48 years ago. She has a 24.5 pack-year smoking history. She has never used smokeless tobacco. She reports that she does not drink alcohol and does not use drugs.   Physical Exam: BP 122/65   Pulse 75   Constitutional:  Alert and oriented, No acute distress. HEENT: [place] AT, moist mucus membranes.  Trachea midline, no masses. Cardiovascular: No clubbing, cyanosis, or edema. Respiratory: Normal respiratory effort, no increased work of breathing. GI: Abdomen is soft, nontender, nondistended, no abdominal masses GU: No CVA tenderness Skin: No rashes, bruises or suspicious lesions. Neurologic: Grossly intact, no focal deficits, moving all 4 extremities. Psychiatric: Normal mood and affect.  Laboratory Data: Lab Results  Component Value Date   WBC 7.3 12/22/2023   HGB 11.3 (L) 12/22/2023   HCT 33.7 (L)  12/22/2023   MCV 91.7 12/22/2023   PLT 219.0 12/22/2023    Lab Results  Component Value Date   CREATININE 1.35 (H) 12/04/2023    No results found for: PSA  No results found for:  TESTOSTERONE  Lab Results  Component Value Date   HGBA1C 5.3 12/04/2023    Urinalysis    Component Value Date/Time   COLORURINE YELLOW 12/22/2023 0851   APPEARANCEUR SL CLOUDY (A) 12/22/2023 0851   APPEARANCEUR Clear 03/01/2023 0937   LABSPEC <=1.005 (A) 12/22/2023 0851   LABSPEC 1.002 03/13/2013 1509   PHURINE 6.0 12/22/2023 0851   GLUCOSEU NEGATIVE 12/22/2023 0851   HGBUR TRACE-INTACT (A) 12/22/2023 0851   BILIRUBINUR NEGATIVE 12/22/2023 0851   BILIRUBINUR neg 11/03/2023 1315   BILIRUBINUR Negative 03/01/2023 0937   BILIRUBINUR Negative 03/13/2013 1509   KETONESUR NEGATIVE 12/22/2023 0851   PROTEINUR NEGATIVE 11/17/2023 0901   UROBILINOGEN 1.0 12/22/2023 0851   NITRITE NEGATIVE 12/22/2023 0851   LEUKOCYTESUR MODERATE (A) 12/22/2023 0851   LEUKOCYTESUR Negative 03/13/2013 1509    Lab Results  Component Value Date   LABMICR See below: 03/01/2023   WBCUA >30 (A) 03/01/2023   LABEPIT 0-10 03/01/2023   MUCUS Presence of (A) 12/04/2023   BACTERIA Many(>50/hpf) (A) 12/22/2023    Pertinent Imaging:  No results found for this or any previous visit.  MRI 2023   Results for orders placed during the hospital encounter of 03/06/18  CT RENAL STONE STUDY  Narrative CLINICAL DATA:  Left flank pain, microscopic hematuria.  EXAM: CT ABDOMEN AND PELVIS WITHOUT CONTRAST  TECHNIQUE: Multidetector CT imaging of the abdomen and pelvis was performed following the standard protocol without IV contrast.  COMPARISON:  CT scan of October 09, 2015.  FINDINGS: Lower chest: No acute abnormality.  Hepatobiliary: No focal liver abnormality is seen. No gallstones, gallbladder wall thickening, or biliary dilatation.  Pancreas: Unremarkable. No pancreatic ductal dilatation or surrounding inflammatory changes.  Spleen: Normal in size without focal abnormality.  Adrenals/Urinary Tract: Adrenal glands appear normal. Bilateral renal cysts are noted. No hydronephrosis or renal  obstruction is noted. No renal or ureteral calculi are noted. Urinary bladder is decompressed.  Stomach/Bowel: Stomach is within normal limits. Appendix appears normal. No evidence of bowel wall thickening, distention, or inflammatory changes. Sigmoid diverticulosis is noted without inflammation.  Vascular/Lymphatic: Aortic atherosclerosis. No enlarged abdominal or pelvic lymph nodes.  Reproductive: Uterus and bilateral adnexa are unremarkable.  Other: Small infraumbilical hernia is noted which contains loop of small bowel, but does not result in incarceration or obstruction.  Musculoskeletal: No acute or significant osseous findings.  IMPRESSION: Small infraumbilical hernia which contains loop of small bowel, but does not result in incarceration or obstruction.  Sigmoid diverticulosis without inflammation.  No hydronephrosis or renal obstruction is noted. No renal or ureteral calculi are noted.  Aortic Atherosclerosis (ICD10-I70.0).   Electronically Signed By: Lynwood Landy Raddle, M.D. On: 03/06/2018 14:31   Assessment & Plan:    1. Neurogenic bladder (Primary), frequency and urgency -  Continue gemtesa . Discussed adding tamsulosin but wont add another meds.  - Urinalysis, Routine w reflex microscopic; Future - Urinalysis, Routine w reflex microscopic  2. Recurrent UTI - discussed new GSM guideline and the nature r/b/a to TVE. She will start. No breast ca.  No follow-ups on file.  Donnice Brooks, MD  [facility]  [address] [place], [place] [address] [phone]      [1]  Allergies Allergen Reactions   Wellbutrin  [Bupropion ]  Patient reports made  deathly sick  years ago at appointment 03/18/20.   Furosemide Other (See Comments)    Electrolyte imbalance  Other Reaction(s): Other (See Comments)  Electrolyte imbalance Electrolyte imbalance Electrolyte imbalance    Electrolyte imbalance   Hydrochlorothiazide  Other  (See Comments)    Electrolyte imbalance  Other Reaction(s): Dizziness, Other (See Comments)

## 2024-04-23 ENCOUNTER — Encounter: Payer: Self-pay | Admitting: Physical Therapy

## 2024-04-23 ENCOUNTER — Other Ambulatory Visit (HOSPITAL_COMMUNITY): Payer: Self-pay

## 2024-04-23 ENCOUNTER — Ambulatory Visit: Attending: Neurosurgery

## 2024-04-23 ENCOUNTER — Other Ambulatory Visit: Payer: Self-pay

## 2024-04-23 DIAGNOSIS — R262 Difficulty in walking, not elsewhere classified: Secondary | ICD-10-CM

## 2024-04-23 DIAGNOSIS — M6281 Muscle weakness (generalized): Secondary | ICD-10-CM | POA: Diagnosis present

## 2024-04-23 DIAGNOSIS — M5459 Other low back pain: Secondary | ICD-10-CM | POA: Diagnosis present

## 2024-04-23 DIAGNOSIS — R2689 Other abnormalities of gait and mobility: Secondary | ICD-10-CM

## 2024-04-23 LAB — MICROSCOPIC EXAMINATION: Epithelial Cells (non renal): >10 /HPF — AB (ref 0–10)

## 2024-04-23 LAB — URINALYSIS, ROUTINE W REFLEX MICROSCOPIC
Bilirubin, UA: NEGATIVE
Glucose, UA: NEGATIVE
Ketones, UA: NEGATIVE
Nitrite, UA: NEGATIVE
Protein,UA: NEGATIVE
RBC, UA: NEGATIVE
Specific Gravity, UA: <1.005 — ABNORMAL LOW (ref 1.005–1.030)
Urobilinogen, Ur: 1 mg/dL (ref 0.2–1.0)
pH, UA: 6 (ref 5.0–7.5)

## 2024-04-23 NOTE — Therapy (Unsigned)
 OUTPATIENT PHYSICAL THERAPY THORACOLUMBAR AND BALANCE TREATMENT/RECERT  Patient Name: Susan [name] MRN: [id number] DOB:07-09-1960, 63 y.o., female Today's Date: 04/23/2024  END OF SESSION:  PT End of Session - 04/23/24 1601     Visit Number 15    Number of Visits 24    Date for Recertification  07/16/24    Authorization Type [facility] Medicare Dual 2025    Authorization - Number of Visits 24    Progress Note Due on Visit 20    PT Start Time 1600    PT Stop Time 1645    PT Time Calculation (min) 45 min    Equipment Utilized During Treatment Gait belt    Activity Tolerance Patient tolerated treatment well    Behavior During Therapy WFL for tasks assessed/performed                  Past Medical History:  Diagnosis Date   ADD (attention deficit disorder)    Alcohol abuse    Anemia    Anxiety    Aortic atherosclerosis 02/20/2018   Chest CT Sept 2019   Arthritis    rheumatoid arthritis   Asthma    Bipolar disorder (HCC)    Centrilobular emphysema (HCC)    Chronic diarrhea 07/22/2022   Cocaine use disorder, moderate, in sustained remission (HCC)    Constipation    Degenerative disc disease at L5-S1 level 09/28/2016   See ortho note May 2018   Depression    bipolar, hx of suicide attempt   Diabetes mellitus, type 2 (HCC)    Diverticulitis 2013   DOE (dyspnea on exertion)    Drug use    Family history of adverse reaction to anesthesia    mom-delayed emergence   GERD (gastroesophageal reflux disease)    H/O suicide attempt    slit wrists   Hepatitis C 06/26/2014   treated with Harvoni   Hip pain    History of echocardiogram    a. 04/2019 Echo: EF >65%, nl RV fxn.   History of MRSA infection 2013   HLD (hyperlipidemia)    Hypertension    a. 06/2021 Renal Duplex: ? bilat RAS; b. 06/2021 CTA Abd: No signif RAS.   Hyponatremia    Hypothyroidism    Incisional hernia 11/08/2012   Knee pain    Left carotid bruit    Morbid obesity (HCC)    Multinodular thyroid      OSA (obstructive sleep apnea)    OSA on CPAP    Osteoporosis    Palpitations    Post-traumatic osteoarthritis of right knee 09/24/2015   Recurrent ventral hernia 11/08/2012   Skin lesion 06/28/2022   Status post total hip replacement, left 07/23/2019   Status post total right knee replacement using cement 05/10/2016   Stroke (HCC) 07/05/2022   right arm,torso and right leg numbness   Tobacco use disorder    Tobacco use disorder, continuous 06/26/2014   Vitamin B12 deficiency    Vitamin D  deficiency 04/09/2015   Past Surgical History:  Procedure Laterality Date   BILATERAL SALPINGOOPHORECTOMY     due to abnormal mass   BREAST BIOPSY Left    neg   BREAST SURGERY Left 20 yrs ago   CESAREAN SECTION     COLONOSCOPY     COLONOSCOPY WITH PROPOFOL  N/A 10/16/2017   Procedure: COLONOSCOPY WITH PROPOFOL ;  Surgeon: Therisa Bi, MD;  Location: [facility] ENDOSCOPY;  Service: Gastroenterology;  Laterality: N/A;   COLONOSCOPY WITH PROPOFOL  N/A 02/16/2021   Procedure: COLONOSCOPY WITH  PROPOFOL ;  Surgeon: Therisa Bi, MD;  Location: [facility] ENDOSCOPY;  Service: Gastroenterology;  Laterality: N/A;   FORAMINOTOMY 1 LEVEL Right 10/10/2022   Procedure: RIGHT L4-5 LAMINOFORAMINOTOMY;  Surgeon: Clois Fret, MD;  Location: [facility] ORS;  Service: Neurosurgery;  Laterality: Right;   HERNIA REPAIR  06/2011, July 2014   Ventral wall repair with Physiomesh   HERNIA REPAIR     2nd.vental wall repair   JOINT REPLACEMENT Right    knee   LUMBAR LAMINECTOMY/ DECOMPRESSION WITH MET-RX N/A 10/12/2022   Procedure: LUMBAR LAMINECTOMY/ DECOMPRESSION WITH MET-RX;  Surgeon: Clois Fret, MD;  Location: [facility] ORS;  Service: Neurosurgery;  Laterality: N/A;   TONSILLECTOMY     TOTAL HIP ARTHROPLASTY Left 07/23/2019   Procedure: TOTAL HIP ARTHROPLASTY;  Surgeon: Edie Norleen PARAS, MD;  Location: [facility] ORS;  Service: Orthopedics;  Laterality: Left;   TOTAL KNEE ARTHROPLASTY Right 05/10/2016   Procedure: TOTAL KNEE ARTHROPLASTY;   Surgeon: Norleen PARAS Edie, MD;  Location: [facility] ORS;  Service: Orthopedics;  Laterality: Right;   TUBAL LIGATION     Patient Active Problem List   Diagnosis Date Noted   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   Chronic pain syndrome 02/15/2024   Urge incontinence 12/22/2023   Painful and cold lower extremity 12/22/2023   Hemiplegia and hemiparesis following cerebral infarction affecting right dominant side (HCC) 12/04/2023   CKD (chronic kidney disease) stage 3, GFR 30-59 ml/min (HCC) 12/04/2023   Dysuria 11/03/2023   History of stroke 04/17/2023   Right leg weakness 04/17/2023   Neurogenic bladder 02/13/2023   Abnormality of gait and mobility 02/13/2023   Morbid obesity with body mass index (BMI) of 40.0 to 44.9 in adult (HCC) 01/10/2023   Left thyroid  nodule 01/10/2023   Bipolar 1 disorder, depressed, moderate (HCC) 11/29/2022   Normocytic normochromic anemia 10/13/2022   Epidural hematoma (HCC) 10/12/2022   Mass in epidural space 10/12/2022   Synovial cyst of lumbar facet joint 10/10/2022   Chronic radicular lumbar pain 10/10/2022   S/P laminectomy 10/10/2022   Epigastric pain 07/22/2022   Enlarged thyroid  07/22/2022   CVA (cerebrovascular accident) (HCC) 07/05/2022   Type II diabetes mellitus with nephropathy (HCC) 06/16/2021   H/O thyroid  nodule 05/31/2021   History of hepatitis C- treated 2014- 2015 with [facility] gastroenterology and followed.  05/19/2021   Left carotid bruit 04/28/2021   Bipolar disorder, in full remission, most recent episode depressed 02/05/2021   Anxiety 05/28/2020   Bipolar affective disorder in remission 05/28/2020   Bipolar disorder, in full remission, most recent episode mixed 03/06/2020   Need for immunization against influenza 01/27/2020   Morbid obesity (HCC) 01/27/2020   Osteoporosis 11/06/2019   Bipolar 1 disorder, mixed, mild (HCC) 10/29/2019   PTSD (post-traumatic stress disorder) 09/27/2019   Bereavement 09/27/2019   Cocaine use disorder,  moderate, in sustained remission (HCC) 09/27/2019   Primary osteoarthritis of left hip 06/14/2019   Allergic rhinitis 08/23/2018   Aortic atherosclerosis 02/20/2018   Centrilobular emphysema (HCC) 02/20/2018   Hyperlipidemia 03/28/2017   Degenerative disc disease at L5-S1 level 09/28/2016   GERD (gastroesophageal reflux disease) 12/17/2015   Chronic back pain 10/09/2015   Post-traumatic osteoarthritis of right knee 09/24/2015   Alcohol use disorder, severe, in sustained remission (HCC) 04/13/2015   OSA on CPAP 04/09/2015   Subclinical hyperthyroidism 06/26/2014   Goiter colloid, toxic, nodular 06/26/2014   Hypertension 06/26/2014   Bipolar I disorder (HCC) 06/26/2014   Lumbar radiculitis 10/03/2013   Diverticulosis 08/24/2013    PCP: Dr. Kalpana  Bair     REFERRING PROVIDER: Dr. Reeves Daisy    REFERRING DIAG: M54.50,G89.29 (ICD-10-CM) - Chronic bilateral low back pain without sciatica  Rationale for Evaluation and Treatment: Rehabilitation  THERAPY DIAG:  Other low back pain  Imbalance  Difficulty in walking, not elsewhere classified  Muscle weakness (generalized)  ONSET DATE: 10/2022  SUBJECTIVE:                                                                                                                                                                                           SUBJECTIVE STATEMENT:   Pt reports that she is feeling less pain today and she wants to try and walk using single point cane.       PERTINENT HISTORY:  She has prior history of left sided stroke affecting her right side in February 2024. She also had a L3-L4 lumbar decompression surgery in June 2024 with complications due to a epidural hematoma that resulted in cauda equinda syndrome and her needing to return to ER for evacuation and she was in the hospital for an additional 8 days following surgery.  She is just moving back to [place] from [place], because she has qualified for housing  in [place]. She describes being most concerned with her balance especially when walking over uneven surfaces. She mainly uses a a 2WW when walking to places that she has to drive to because it is easier to transport. She has not had a fall in the past 6 mo, but she is scared of falling especially when walking over uneven surfaces. She does report an improvement in LE function with her now being able to raise her toes and that she was not able to do this after surgery.   PAIN:  Are you having pain? Yes: NPRS scale: 5/10       Pain location: Right ischial tuberosity   Pain description: Dull and achy      Aggravating factors: Walking and performing hip abduction  Relieving factors: Using ice and keep still     PRECAUTIONS: Fall; she is afraid of falling    RED FLAGS: None   WEIGHT BEARING RESTRICTIONS: No  FALLS:  Has patient fallen in last 6 months? No  LIVING ENVIRONMENT: Lives with: lives alone Lives in: House/apartment Stairs: Yes: External: 4 steps; on right going up and there is a ramp going to a house and there is a ramped main entrance leading to lobby. Place is called [employer]   Has following equipment at home: Single point cane, Environmental Consultant - 2 wheeled, Environmental Consultant - 4 wheeled, shower chair, Grab bars, and Ramped entry  OCCUPATION: Retired  PLOF: Independent  PATIENT GOALS: Pt wants to be able to walk outside using single point cane and to feel more confidence when walking especially over uneven surfaces. She currently uses rollator to walk dog at home and 2WW when walking anywhere where she has to drive.   NEXT MD VISIT: October 2025     OBJECTIVE:  Note: Objective measures were completed at Evaluation unless otherwise noted.  VITALS BP 118/66 HR  60 Sp02  97%  DIAGNOSTIC FINDINGS:   EXAM: MRI LUMBAR SPINE 02/23/2024  IMPRESSION: 1. No compressive central canal stenosis or acute neural impingement. 2. Moderate left lateral recess stenosis at L2-3 without  definite neural compression. 3. Postoperative changes at L3-4 without recurrent canal stenosis; left foraminal narrowing that could affect the L3 nerve. Resolution of the previously seen posterior extradural fluid collection. 4. Scoliotic curvature convex to the right with apex at L2-3, similar to the prior exam. 5. Mild edematous endplate changes at L2-3 and L3-4, which may contribute to regional back pain. 6. No anatomic limitation anticipated for spinal cord stimulator placement at T11-12 or T12-L1.   Electronically signed by: Oneil Officer MD 02/26/2024 09:46 AM EDT RP Workstation: [id number]  /////////////////////////////////////////////////////////////////////////////////////////////////////////////////// CLINICAL DATA:  Low back pain. Cauda equina syndrome suspected. Cyst removal from the spine two days ago. Postoperative urinary incontinence and bilateral leg numbness.   EXAM: MRI LUMBAR SPINE WITHOUT CONTRAST   TECHNIQUE: Multiplanar, multisequence MR imaging of the lumbar spine was performed. No intravenous contrast was administered.   COMPARISON:  05/18/2022   FINDINGS: Segmentation: 5 lumbar type vertebral bodies as numbered previously.   Alignment: Scoliotic curvature convex to the right as seen previously. No significant listhesis.   Vertebrae: Edematous change of the endplates at T10, U88 and T12, likely to relate to regional pain. This is probably degenerative. Lesser vertebral endplate edema noted at L3-4 and L4-5.   Conus medullaris and cauda equina: Conus extends to the L1 level. Conus and cauda equina appear normal.   Paraspinal and other soft tissues: Posterior surgical approach at the L4-5 level from the right-side has an expected appearance.   Disc levels:   At the surgical level and extending within the spinal canal from the upper L3 level to the L 4 5 disc level, there is a fluid collection which causes marked compression of the thecal sac. This  is most severe at the level of the L3-4 disc where the fluid collection within the canal measures 1.8 x 1.5 cm in diameter and markedly flattens the thecal sac anteriorly.   Other than the surgical decompression of the synovial cyst on the right and the postoperative intraspinal fluid collection, there is no change since the preoperative exam, as one might expect.   IMPRESSION: 1. At the surgical level and extending from the upper L3 level to the L4-5 disc level, there is a postoperative fluid collection within the spinal canal which causes marked compression of the thecal sac anteriorly. This is most severe at the level of the L3-4 disc where the fluid collection within the canal measures 1.8 x 1.5 cm in diameter and markedly flattens the thecal sac anteriorly.     Electronically Signed   By: Oneil Officer M.D.   On: 10/12/2022 16:20  PATIENT SURVEYS:  ABC scale: The Activities-Specific Balance Confidence (ABC) Scale 0% 10 20 30  40 50 60 70 80 90 100% No confidence<->completely confident  How confident are you that you will not lose your balance or become unsteady when you . SABRA SABRA  Date tested 01/01/24  Walk around the house 80%  2. Walk up or down stairs 80%  3. Bend over and pick up a slipper from in front of a closet floor 60%  4. Reach for a small can off a shelf at eye level 90%  5. Stand on tip toes and reach for something above your head 0%  6. Stand on a chair and reach for something 40%  7. Sweep the floor 50%  8. Walk outside the house to a car parked in the driveway 90%  9. Get into or out of a car 80%  10. Walk across a parking lot to the mall 80%  11. Walk up or down a ramp 80%  12. Walk in a crowded mall where people rapidly walk past you 60%  13. Are bumped into by people as you walk through the mall 40%  14. Step onto or off of an escalator while you are holding onto the railing 50%  15. Step onto or off an escalator while holding onto parcels such that you  cannot hold onto the railing 30%  16. Walk outside on icy sidewalks 0%  Total: #/16  57% (910/1600)       COGNITION: Overall cognitive status: Within functional limits for tasks assessed     SENSATION: Light touch: Impaired electricity down her RLE   MUSCLE LENGTH: Hamstrings: Not tested   Debby test: Not tested    POSTURE: rounded shoulders  PALPATION: Not performed    LUMBAR ROM:   AROM eval  Flexion 80%  Extension 100%  Right lateral flexion 100%  Left lateral flexion 100%  Right rotation 100%  Left rotation 100%   (Blank rows = not tested)  LOWER EXTREMITY ROM:     Active  Right eval Left eval  Hip flexion    Hip extension    Hip abduction    Hip adduction    Hip internal rotation    Hip external rotation    Knee flexion    Knee extension    Ankle dorsiflexion    Ankle plantarflexion    Ankle inversion    Ankle eversion     (Blank rows = not tested)  LOWER EXTREMITY MMT:    MMT Right eval Left eval  Hip flexion 4- 4  Hip extension    Hip abduction    Hip adduction    Hip internal rotation    Hip external rotation    Knee flexion 4 4  Knee extension 4 4  Ankle dorsiflexion 4 4  Ankle plantarflexion    Ankle inversion    Ankle eversion     (Blank rows = not tested)    FUNCTIONAL TESTS:  5 times sit to stand: 14 sec , 03/07/24:11 sec  30 seconds chair stand test: 10?30/25:  11 reps, 13 reps    Timed up and go (TUG): 15.38 sec with use of single point cane   6 minute walk test: 553 ft    10 meter walk test: 0.61 m/sec                 DGI:  13/26                       <=19/24  predictive of falls in the elderly     GAIT: Distance walked: 30 ft    Assistive device utilized: Walker - 2 wheeled Level of assistance: Modified independence Comments: Significant use of BUE support with  decrease step length bilaterally.    TREATMENT DATE:   PHYSICAL PERFORMANCE   5xSTS: 11 sec  30 sec chair stands 13 reps   TUG: 12.27 sec with 2WW,   15 sec with single point cane    [facility]  Activities balance confidence scale:   500 ft up and down hill with single point cane.   -intermittent contact guard assistance   -Decreased step length bilaterally with RLE<LLE     PATIENT EDUCATION:  Education details: Form and technique for correct performance of exercise and explanation about cut-offs for functional tests.  Person educated: Patient Education method: Explanation, Demonstration, Verbal cues, and Handouts Education comprehension: verbalized understanding, returned demonstration, and verbal cues required  HOME EXERCISE PROGRAM: Access Code: [email or web address] URL: [email or web address] Date: 02/29/2024 Prepared by: Toribio Servant  Program Notes Toe to heel on right foot with mirror in front of you 3 sets  x 20 reps 5-7 days per week    Exercises - Seated Slump Neural Tensioner  - 1 x daily - 7 x weekly - 1 sets - 10 reps - Seated Hamstring Stretch  - 1 x daily - 7 x weekly - 2-3 reps - 60 sec hold - Seated Hamstring Stretch (Mirrored)  - 1 x daily - 7 x weekly - 2-3 reps - 60 sec hold - Alternating Step Taps with Counter Support (Mirrored)  - 5-7 x weekly - 3 sets - 20 reps - Sit to Stand  - 3-4 x weekly - 3 sets - 10 reps - Standing Ankle Dorsiflexion with Table Support  - 3-4 x weekly - 3 sets - 10 reps - Standing March with Counter Support (Mirrored)  - 3-4 x weekly - 3 sets - 10 reps - Standing Knee Flexion AROM with Chair Support (Mirrored)  - 3-4 x weekly - 3 sets - 10 reps - Standing Hip Abduction with Counter Support (Mirrored)  - 3-4 x weekly - 3 sets - 10 reps - Long Sitting Ankle Plantar Flexion with Resistance (Mirrored)  - 3-4 x weekly - 3 sets - 15 reps  CLINICAL IMPRESSION:   Pt progressing towards goals with improvement in LE strength and endurance along with improvement in mobility that places her at an increased risk for falls. She also shows significant decrease in pain with ability to perform  increased volume of activity without using TENS. PT focused on therapeutic activity with walking with single point cane outside over uneven surface, which patient was able to perform safely albeit with intermittent contact guard assistance. Pt is electing to take a break until from physical therapy for next several weeks to devote more attention to medical side of care.She plans on returning in mid December. She will continue to benefit from skilled PT to improve motor control and strength of lower extremities to improve dynamic balance and gait stability to avoid falling and to decrease level of assistive device to return to her prior level of function.   OBJECTIVE IMPAIRMENTS: Abnormal gait, decreased endurance, decreased ROM, decreased strength, impaired flexibility, impaired sensation, obesity, and pain.   ACTIVITY LIMITATIONS: carrying, lifting, bending, standing, squatting, stairs, dressing, hygiene/grooming, and locomotion level  PARTICIPATION LIMITATIONS: meal prep, cleaning, laundry, shopping, community activity, and yard work  PERSONAL FACTORS: Fitness, Past/current experiences, Time since onset of injury/illness/exacerbation, and 3+ comorbidities: h/o stroke with right sided deficits, L3-L5 decompression, HLD, HTN are also affecting patient's functional outcome.   REHAB POTENTIAL: Fair multiple co-morbidities and chronicity of condition  CLINICAL DECISION MAKING: Evolving/moderate complexity  EVALUATION  COMPLEXITY: Moderate   GOALS: Goals reviewed with patient? No  SHORT TERM GOALS: Target date: 01/15/2024  Patient will demonstrate undestanding of home exercise plan by performing exercises correctly with evidence of good carry over with min to no verbal or tactile cues .   Baseline: NT  01/16/24: Performing Independently  Goal status: ACHIEVED     2.   Pt will increase by at least 64m (13ft) in order to demonstrate clinically significant improvement in cardiopulmonary  endurance and community ambulation Baseline: 8/28: 553' with RW and unable to complete 6 minutes (stopped at 5 minutes)   Goal status: NOT MET     3.  Patient will show decreased risk of falling as evidence by decrease in self-selected gait speed by >= 0.12 m/sec.  Baseline: 8/28: 0.61 m/s Goal status: NOT MET    4.  Pt will decrease 5TSTS by at least 3 seconds to demonstrate a minimal clinically significant improvement in LE strength.  Baseline: 14 seconds  Goal status: NOT MET     LONG TERM GOALS: Target date: 03/25/2024  Patient will improve activities specific balance scale score >=67% as evidence of an improvement self-perception of function and to decrease risk for falls Mae et al., 2022).  Baseline: 57%  02/14/24: 33% 03/07/24: 44% (700/1600)   Goal status: ONGOING    2.  Patient will ambulate >=1000 ft in to show community ambulator status to be able to have aerobic endurance to return to fulfilling walking tasks at his job.  Baseline: 8/28: 553' with RW and unable to complete 6 minutes (stopped at 5 minutes)  02/05/24: 558 ft with 2WW; 04/23/24: 686' using RW Goal status: ONGOING    3.  Patient will demonstrate reduced falls risk as evidenced by Dynamic Gait Index (DGI) >19/24 to decrease risk of falling.  Baseline: 13/24 with use of single point cane in RUE   02/19/24: 15/24-  mild for most, moderate for stairs and obstacle over.; 04/23/24: using SPC, 14/24   Goal status: ONGOING     4. Patient will perform in >=1.0 m./sec as evidence of improved mobility, decreased falls risk, and overall functional independence.  Baseline: 8/28: 0.61 m/s  02/12/24: 0.73 m/sec; 04/23/24: with no AD: 0.55 m/sec, SPC: 0.55 m/s Goal status: ONGOING   PLAN:  PT FREQUENCY: 1-2x/week  PT DURATION: 12 weeks  PLANNED INTERVENTIONS: 97164- PT Re-evaluation, 97750- Physical Performance Testing, 97110-Therapeutic exercises, 97530- Therapeutic activity, 97112- Neuromuscular  re-education, 97535- Self Care, 02859- Manual therapy, [id number]- Gait training, [phone]- Orthotic Initial, [phone]- Orthotic/Prosthetic subsequent, [phone]- Canalith repositioning, [id number]- Aquatic Therapy, H9716- Electrical stimulation (unattended), [phone]- Electrical stimulation (manual), [id number]- Ultrasound, 79439 (1-2 muscles), 20561 (3+ muscles)- Dry Needling, Patient/Family education, Balance training, Stair training, Taping, Joint mobilization, Joint manipulation, Spinal manipulation, Spinal mobilization, Scar mobilization, Vestibular training, DME instructions, Cryotherapy, Moist heat, and Biofeedback.  PLAN FOR NEXT SESSION: , DGI retest, gait speed. Gait training with single point cane and dynamic balance exercises. Ongoing progression of OTAGO exercises: mini-squats. Step Ups on RLE if possible and work on walking with for improved mobility and cardovascular improvement  Toribio Servant PT, DPT  [facility] [address], [place], [address] Phone: [phone]   Fax:  [phone]

## 2024-04-24 ENCOUNTER — Ambulatory Visit (INDEPENDENT_AMBULATORY_CARE_PROVIDER_SITE_OTHER)

## 2024-04-24 ENCOUNTER — Telehealth: Payer: Self-pay

## 2024-04-24 VITALS — BP 120/80 | HR 60 | Temp 97.7°F | Ht 61.5 in | Wt 230.2 lb

## 2024-04-24 DIAGNOSIS — I129 Hypertensive chronic kidney disease with stage 1 through stage 4 chronic kidney disease, or unspecified chronic kidney disease: Secondary | ICD-10-CM | POA: Diagnosis not present

## 2024-04-24 DIAGNOSIS — Z8673 Personal history of transient ischemic attack (TIA), and cerebral infarction without residual deficits: Secondary | ICD-10-CM

## 2024-04-24 DIAGNOSIS — M961 Postlaminectomy syndrome, not elsewhere classified: Secondary | ICD-10-CM | POA: Diagnosis not present

## 2024-04-24 DIAGNOSIS — N3941 Urge incontinence: Secondary | ICD-10-CM | POA: Diagnosis not present

## 2024-04-24 DIAGNOSIS — E1169 Type 2 diabetes mellitus with other specified complication: Secondary | ICD-10-CM

## 2024-04-24 DIAGNOSIS — Z122 Encounter for screening for malignant neoplasm of respiratory organs: Secondary | ICD-10-CM | POA: Diagnosis not present

## 2024-04-24 DIAGNOSIS — G4733 Obstructive sleep apnea (adult) (pediatric): Secondary | ICD-10-CM | POA: Diagnosis not present

## 2024-04-24 DIAGNOSIS — E782 Mixed hyperlipidemia: Secondary | ICD-10-CM | POA: Diagnosis not present

## 2024-04-24 DIAGNOSIS — I701 Atherosclerosis of renal artery: Secondary | ICD-10-CM | POA: Insufficient documentation

## 2024-04-24 DIAGNOSIS — D649 Anemia, unspecified: Secondary | ICD-10-CM | POA: Diagnosis not present

## 2024-04-24 DIAGNOSIS — E1121 Type 2 diabetes mellitus with diabetic nephropathy: Secondary | ICD-10-CM

## 2024-04-24 DIAGNOSIS — N183 Chronic kidney disease, stage 3 unspecified: Secondary | ICD-10-CM

## 2024-04-24 DIAGNOSIS — I10 Essential (primary) hypertension: Secondary | ICD-10-CM | POA: Diagnosis not present

## 2024-04-24 DIAGNOSIS — Z823 Family history of stroke: Secondary | ICD-10-CM | POA: Insufficient documentation

## 2024-04-24 MED ORDER — ZEPBOUND 2.5 MG/0.5ML ~~LOC~~ SOAJ: 2.5000 mg | SUBCUTANEOUS | 0 refills | Status: DC

## 2024-04-24 MED ORDER — CARVEDILOL 12.5 MG PO TABS: 12.5000 mg | ORAL_TABLET | Freq: Two times a day (BID) | ORAL | 3 refills | Status: AC

## 2024-04-24 MED ORDER — ASPIRIN 81 MG PO TBEC: 81.0000 mg | DELAYED_RELEASE_TABLET | Freq: Every day | ORAL | 3 refills | Status: AC

## 2024-04-24 MED ORDER — ZEPBOUND 5 MG/0.5ML ~~LOC~~ SOAJ: 5.0000 mg | SUBCUTANEOUS | 1 refills | Status: DC

## 2024-04-24 MED ORDER — TELMISARTAN 80 MG PO TABS: 80.0000 mg | ORAL_TABLET | Freq: Every day | ORAL | 3 refills | Status: AC

## 2024-04-24 MED ORDER — ROSUVASTATIN CALCIUM 20 MG PO TABS: 20.0000 mg | ORAL_TABLET | Freq: Every day | ORAL | 3 refills | Status: AC

## 2024-04-24 MED ORDER — GEMTESA 75 MG PO TABS: 1.0000 | ORAL_TABLET | Freq: Every day | ORAL | 3 refills | Status: AC

## 2024-04-24 NOTE — Assessment & Plan Note (Addendum)
 A1c has been well controlled on Ozempic  2 mg weekly. Patient prefers Zepbound  for weight control and OSA. We will check for A1c today and repeat it again in 6 months. For now we wills top Ozempic  and start her on Zepbound  plan per obesity. If A1c not within goal <7 with Zepbound  recommend going back to Ozempic .  She declined checking urine microalbumin level today.  Orders:   HgB A1c

## 2024-04-24 NOTE — Telephone Encounter (Signed)
 PA needed for Zepbound  please route updates to [facility]

## 2024-04-24 NOTE — Assessment & Plan Note (Addendum)
 Continue follow up with pain (at [facility]) and Dr. Avanell at [facility].

## 2024-04-24 NOTE — Assessment & Plan Note (Addendum)
 Referral made to low dose lung cancer screening.  Orders:   Ambulatory Referral for Lung Cancer Scre

## 2024-04-24 NOTE — Assessment & Plan Note (Signed)
 Continue Crestor  20 mg and aspirin  81 mg daily.

## 2024-04-24 NOTE — Assessment & Plan Note (Addendum)
 Compliant with CPAP. Start Zepbound . Plan per morbid obesity.  Orders:   tirzepatide  (ZEPBOUND ) 2.5 MG/0.5ML Pen; Inject 2.5 mg into the skin once a week.   tirzepatide  (ZEPBOUND ) 5 MG/0.5ML Pen; Inject 5 mg into the skin once a week.

## 2024-04-24 NOTE — Assessment & Plan Note (Addendum)
 Check CBC and B12 today.  Orders:   CBC w/Diff   B12

## 2024-04-24 NOTE — Patient Instructions (Addendum)
-   Stop Ozempic . Start Zepbound  2.5 mg once weekly for 30 days then increase dose to 5 mg weekly. Please reach out to our office if you develop nausea, vomiting, abdominal pain. If A1c goes up in 6 months we may need to switching you back to Ozempic .    - Follow up in 6 months or sooner if you need.   - You will need to take Aspirin  81 mg daily for life long.

## 2024-04-24 NOTE — Assessment & Plan Note (Addendum)
 On Ozempic  for type II DM. Blood glucose has been stable. Given morbid obesity she is interested in starting Zepbound .  Discussed Zepbound  for weight management. Insurance coverage uncertain. She prefers Zepbound  over Ozempic . Stop Ozempic .  Prescribed Zepbound  2.5 mg for 30 days, increase to 5 mg if tolerated and covered by insurance. Discontinued Ozempic  if Zepbound  is initiated. Orders:   tirzepatide  (ZEPBOUND ) 2.5 MG/0.5ML Pen; Inject 2.5 mg into the skin once a week.   tirzepatide  (ZEPBOUND ) 5 MG/0.5ML Pen; Inject 5 mg into the skin once a week.

## 2024-04-24 NOTE — Assessment & Plan Note (Addendum)
 Continue Gemtesa  75 mg daily. Recently started on vaginal estrogen cream by urologist to reduce recurrent UTI like symptoms. Counseling provided on compliance, adherence to therapy.   Orders:   Vibegron  (GEMTESA ) 75 MG TABS; Take 1 tablet (75 mg total) by mouth daily.

## 2024-04-24 NOTE — Assessment & Plan Note (Addendum)
 Continue crestor  20 mg daily.Orders:   rosuvastatin  (CRESTOR ) 20 MG tablet; Take 1 tablet (20 mg total) by mouth daily.

## 2024-04-24 NOTE — Progress Notes (Signed)
 Established Patient Office Visit   Subjective  Patient ID: Susan [name], female    DOB: Aug 29, 1960  Age: 63 y.o. MRN: [id number]  Chief Complaint  Patient presents with   Diabetes   Urinary Tract Infection    Discussed the use of AI scribe software for clinical note transcription with the patient, who gave verbal consent to proceed.  History of Present Illness Susan [name] is a 63 year old female with a history of stroke and hypertension who presents for follow-up on her medication.  DM II with nephropathy:  Renal artery stenosis, left renal cyst: Morbid obesity: Hyperlipidemia: Hypertension: OSA: H/O Stroke:  - On Ozempic  2mg  weekly. BG has been well controlled. Would like to change to Zepbound  to help with weight loss.  - On Telmisartan  80 mg daily. She was started on Amlodipine  after discontinuing Coreg  in 02/06/24 by cardiology due to concerns of fatigue. However since starting Amlodipine  she developed lower leg swelling. She discontinued Amlodipine  and is back on taking Coreg  12.5 mg twice a day. Reports she has been tolerating this well.    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   Recurrent UTI: Started on vaginal estrogen during her last visit with him.   Insomnia, Bipolar I:  Continuing to follow up with psychiatrist Dr. Eappen. Currently on Trazadone 50 mg at bedtime, Latuda  20 mg at bedtime and Cymbalta  50 mg daily. Denies SI/HI.    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 Clois and Dr. Marcelino on 02/15/2024, at which time a spinal cord stimulator was discussed pending MRI of the lumbar spine.  She was most recently seen on 04/15/2024 by Dr. Avanell for persistent right-sided low back pain with radiation into the right buttock and lateral thigh, associated with numbness of the right foot. Her pain is worsened  with standing and walking.  At her prior visit on 02/26/2024, she continued medical management including Tylenol  Extra Strength 2 tablets twice daily as needed, Mobic , Flexeril  5 mg at bedtime, gabapentin  300 mg twice daily, Cymbalta , and Norco 5/325 mg daily as needed for breakthrough pain (40 tablets per month).  She is scheduled for an SCS trial on 05/13/2024. She plans to discuss with Dr. Marcelino the possibility of assuming management of her pain medications. Functionally, she reports mild improvement and has begun taking a few steps without an assistive device in the home. She started physical therapy on 04/23/2024.S  Current everyday smoking cigarette:  She smokes two cigarettes a day and has a smoking history since age 29. She regularly uses a CPAP machine for sleep apnea. Attempts to contact a low-dose lung cancer screening program have been unsuccessful.  Normocytic normochromic anemia: No blood in stool is reported, and recent blood pressure readings have been stable.    ROS As per HPI    Objective:     BP 120/80 (BP Location: Left Arm, Patient Position: Sitting, Cuff Size: Normal)   Pulse 60   Temp 97.7 F (36.5 C) (Oral)   Ht 5' 1.5 (1.562 m)   Wt 230 lb 3.2 oz (104.4 kg)   SpO2 99%   BMI 42.79 kg/m      04/24/2024    4:03 PM 04/01/2024    9:51 AM 02/15/2024    9:59 AM  Depression screen PHQ 2/9  Decreased Interest 1 0 --  Down, Depressed, Hopeless 2 1   PHQ -  2 Score 3 1   Altered sleeping 1 2   Tired, decreased energy 1 1   Change in appetite 1 0   Feeling bad or failure about yourself  0 0   Trouble concentrating 0 0   Moving slowly or fidgety/restless 0 0   Suicidal thoughts 0 0   PHQ-9 Score 6 4   Difficult doing work/chores Somewhat difficult Somewhat difficult       04/24/2024    4:03 PM 12/22/2023    8:13 AM 12/04/2023   11:16 AM 11/03/2023    1:15 PM  GAD 7 : Generalized Anxiety Score  Nervous, Anxious, on Edge 1 0 1 0  Control/stop worrying 0 0  0 0  Worry too much - different things 0 0 0 0  Trouble relaxing 1 0 0 0  Restless 0 0 0 0  Easily annoyed or irritable 0 0 0 1  Afraid - awful might happen 0 0 0 0  Total GAD 7 Score 2 0 1 1  Anxiety Difficulty Not difficult at all Not difficult at all Somewhat difficult Somewhat difficult      04/24/2024    4:03 PM 04/01/2024    9:51 AM 02/15/2024    9:59 AM  Depression screen PHQ 2/9  Decreased Interest 1 0 --  Down, Depressed, Hopeless 2 1   PHQ - 2 Score 3 1   Altered sleeping 1 2   Tired, decreased energy 1 1   Change in appetite 1 0   Feeling bad or failure about yourself  0 0   Trouble concentrating 0 0   Moving slowly or fidgety/restless 0 0   Suicidal thoughts 0 0   PHQ-9 Score 6 4   Difficult doing work/chores Somewhat difficult Somewhat difficult       04/24/2024    4:03 PM 12/22/2023    8:13 AM 12/04/2023   11:16 AM 11/03/2023    1:15 PM  GAD 7 : Generalized Anxiety Score  Nervous, Anxious, on Edge 1 0 1 0  Control/stop worrying 0 0 0 0  Worry too much - different things 0 0 0 0  Trouble relaxing 1 0 0 0  Restless 0 0 0 0  Easily annoyed or irritable 0 0 0 1  Afraid - awful might happen 0 0 0 0  Total GAD 7 Score 2 0 1 1  Anxiety Difficulty Not difficult at all Not difficult at all Somewhat difficult Somewhat difficult   SDOH Screenings   Food Insecurity: No Food Insecurity (04/01/2024)  Housing: Low Risk (04/01/2024)  Transportation Needs: No Transportation Needs (04/01/2024)  Utilities: Not At Risk (04/01/2024)  Alcohol Screen: Low Risk (04/01/2024)  Depression (PHQ2-9): Medium Risk (04/24/2024)  Financial Resource Strain: Low Risk (04/01/2024)  Physical Activity: Inactive (04/01/2024)  Social Connections: Moderately Isolated (04/01/2024)  Stress: No Stress Concern Present (04/01/2024)  Tobacco Use: High Risk (04/24/2024)  Health Literacy: Adequate Health Literacy (04/01/2024)     Physical Exam Constitutional:      General: She is not in  acute distress.    Appearance: She is obese.  HENT:     Head: Normocephalic and atraumatic.  Cardiovascular:     Rate and Rhythm: Normal rate.  Pulmonary:     Effort: Pulmonary effort is normal.     Breath sounds: Normal breath sounds.  Abdominal:     General: Abdomen is protuberant.     Palpations: Abdomen is soft.     Tenderness: There is no abdominal tenderness.  Musculoskeletal:     Right lower leg: No edema.     Left lower leg: No edema.  Skin:    General: Skin is warm.  Neurological:     Mental Status: She is oriented to person, place, and time.     Gait: Gait abnormal (short stride, using a walker for ambulation).  Psychiatric:        Mood and Affect: Mood normal.        No results found for any visits on 04/24/24.  The ASCVD Risk score (Arnett DK, et al., 2019) failed to calculate for the following reasons:   Risk score cannot be calculated because patient has a medical history suggesting prior/existing ASCVD   * - Cholesterol units were assumed     Assessment & Plan:   Patient requesting pharmacy change to [facility] from [facility] pharmacy. All prescription sent to [facility].  Assessment & Plan Primary hypertension Amlodipine  s/e: Lower leg edema.  Continue Carvedilol  12.5 mg twice a day and Telmisartan  80 mg daily. BP within coal.  Orders:   carvedilol  (COREG ) 12.5 MG tablet; Take 1 tablet (12.5 mg total) by mouth 2 (two) times daily with a meal.   telmisartan  (MICARDIS ) 80 MG tablet; Take 1 tablet (80 mg total) by mouth daily.  Screening for lung cancer Referral made to low dose lung cancer screening.  Orders:   Ambulatory Referral for Lung Cancer Scre  Morbid obesity (HCC) On Ozempic  for type II DM. Blood glucose has been stable. Given morbid obesity she is interested in starting Zepbound .  Discussed Zepbound  for weight management. Insurance coverage uncertain. She prefers Zepbound  over Ozempic . Stop Ozempic .  Prescribed Zepbound  2.5 mg for 30 days,  increase to 5 mg if tolerated and covered by insurance. Discontinued Ozempic  if Zepbound  is initiated. Orders:   tirzepatide  (ZEPBOUND ) 2.5 MG/0.5ML Pen; Inject 2.5 mg into the skin once a week.   tirzepatide  (ZEPBOUND ) 5 MG/0.5ML Pen; Inject 5 mg into the skin once a week.  OSA on CPAP Compliant with CPAP. Start Zepbound . Plan per morbid obesity.  Orders:   tirzepatide  (ZEPBOUND ) 2.5 MG/0.5ML Pen; Inject 2.5 mg into the skin once a week.   tirzepatide  (ZEPBOUND ) 5 MG/0.5ML Pen; Inject 5 mg into the skin once a week.  Mixed hyperlipidemia Continue crestor  20 mg daily.Orders:   rosuvastatin  (CRESTOR ) 20 MG tablet; Take 1 tablet (20 mg total) by mouth daily.  Urge incontinence Continue Gemtesa  75 mg daily. Recently started on vaginal estrogen cream by urologist to reduce recurrent UTI like symptoms. Counseling provided on compliance, adherence to therapy.   Orders:   Vibegron  (GEMTESA ) 75 MG TABS; Take 1 tablet (75 mg total) by mouth daily.  Normocytic normochromic anemia Check CBC and B12 today.  Orders:   CBC w/Diff   B12  Type II diabetes mellitus with nephropathy (HCC) A1c has been well controlled on Ozempic  2 mg weekly. Patient prefers Zepbound  for weight control and OSA. We will check for A1c today and repeat it again in 6 months. For now we wills top Ozempic  and start her on Zepbound  plan per obesity. If A1c not within goal <7 with Zepbound  recommend going back to Ozempic .  She declined checking urine microalbumin level today.  Orders:   HgB A1c  History of CVA (cerebrovascular accident) Continue Crestor  20 mg and aspirin  81 mg daily.     Failed back surgical syndrome Continue follow up with pain (at [facility]) and Dr. Avanell at [facility].       Return in  about 6 months (around 10/23/2024) for Chronic follow up .   Luke Shade, MD

## 2024-04-24 NOTE — Assessment & Plan Note (Addendum)
 Amlodipine  s/e: Lower leg edema.  Continue Carvedilol  12.5 mg twice a day and Telmisartan  80 mg daily. BP within coal.  Orders:   carvedilol  (COREG ) 12.5 MG tablet; Take 1 tablet (12.5 mg total) by mouth 2 (two) times daily with a meal.   telmisartan  (MICARDIS ) 80 MG tablet; Take 1 tablet (80 mg total) by mouth daily.

## 2024-04-25 ENCOUNTER — Ambulatory Visit: Payer: Self-pay

## 2024-04-25 ENCOUNTER — Other Ambulatory Visit (HOSPITAL_COMMUNITY): Payer: Self-pay

## 2024-04-25 LAB — CBC WITH DIFFERENTIAL/PLATELET
Basophils Absolute: 0 K/uL (ref 0.0–0.1)
Basophils Relative: 0.6 % (ref 0.0–3.0)
Eosinophils Absolute: 0.3 K/uL (ref 0.0–0.7)
Eosinophils Relative: 3.8 % (ref 0.0–5.0)
HCT: 33.9 % — ABNORMAL LOW (ref 36.0–46.0)
Hemoglobin: 11.5 g/dL — ABNORMAL LOW (ref 12.0–15.0)
Lymphocytes Relative: 34.8 % (ref 12.0–46.0)
Lymphs Abs: 2.7 K/uL (ref 0.7–4.0)
MCHC: 34 g/dL (ref 30.0–36.0)
MCV: 91.4 fl (ref 78.0–100.0)
Monocytes Absolute: 0.5 K/uL (ref 0.1–1.0)
Monocytes Relative: 6.1 % (ref 3.0–12.0)
Neutro Abs: 4.2 K/uL (ref 1.4–7.7)
Neutrophils Relative %: 54.7 % (ref 43.0–77.0)
Platelets: 164 K/uL (ref 150.0–400.0)
RBC: 3.71 Mil/uL — ABNORMAL LOW (ref 3.87–5.11)
RDW: 13.5 % (ref 11.5–15.5)
WBC: 7.7 K/uL (ref 4.0–10.5)

## 2024-04-25 LAB — VITAMIN B12: Vitamin B-12: 319 pg/mL (ref 211–911)

## 2024-04-25 LAB — HEMOGLOBIN A1C: Hgb A1c MFr Bld: 5.4 % (ref 4.6–6.5)

## 2024-04-26 NOTE — Assessment & Plan Note (Signed)
 Suspected secondary to HTN, h/o microalbuminuria. Seeing nephrologist Dr. Dennise (last visit on 12/26/23). Avoiding SGLT-2 due to h/o UTIs. Continue f/u with Dr. Dennise. His note from 12/26/23 reviewed.

## 2024-04-29 ENCOUNTER — Other Ambulatory Visit (HOSPITAL_COMMUNITY): Payer: Self-pay

## 2024-04-29 ENCOUNTER — Telehealth: Payer: Self-pay

## 2024-04-29 NOTE — Telephone Encounter (Signed)
 PA request has been Cancelled. New Encounter has been or will be created for follow up. For additional info see Pharmacy Prior Auth telephone encounter from 04/29/2024.

## 2024-04-29 NOTE — Telephone Encounter (Signed)
 Pharmacy Patient Advocate Encounter   Received notification from Physician's Office that prior authorization for Zepbound  2.5 mg/0.5 ml is required/requested.   Insurance verification completed.   The patient is insured through [place].   Per test claim: Per test claim, medication is not covered due to plan/benefit exclusion, PA not submitted at this time

## 2024-04-30 ENCOUNTER — Ambulatory Visit: Admitting: Physical Therapy

## 2024-04-30 NOTE — Progress Notes (Signed)
 Susan [name]                                          MRN: [id number]   04/30/2024   The [facility] Quality Team Specialist reviewed this patient medical record for the purposes of chart review for care gap closure. The following were reviewed: chart review for care gap closure-diabetic eye exam and kidney health evaluation for diabetes:eGFR  and uACR.    [facility]BCI Quality Team

## 2024-04-30 NOTE — Progress Notes (Signed)
 Susan [name]                                          MRN: [id number]   04/30/2024   The [facility]BCI Quality Team Specialist reviewed this patient medical record for the purposes of chart review for care gap closure. The following were reviewed: abstraction for care gap closure-glycemic status assessment.    VBCI Quality Team

## 2024-05-07 ENCOUNTER — Telehealth: Admitting: Psychiatry

## 2024-05-07 DIAGNOSIS — F4310 Post-traumatic stress disorder, unspecified: Secondary | ICD-10-CM

## 2024-05-07 MED ORDER — TRAZODONE HCL 50 MG PO TABS: 50.0000 mg | ORAL_TABLET | Freq: Every evening | ORAL | 2 refills | Status: AC | PRN

## 2024-05-07 NOTE — Progress Notes (Signed)
 Patient unable to connect for the appointment due to connection problems.  Patient advised to call to reschedule the appointment at her convenience.  Communicated with staff.

## 2024-05-08 ENCOUNTER — Ambulatory Visit: Admitting: Physical Therapy

## 2024-05-13 ENCOUNTER — Ambulatory Visit
Admission: RE | Admit: 2024-05-13 | Discharge: 2024-05-13 | Disposition: A | Discharge status: Home / Self Care | Source: Ambulatory Visit | Attending: Student in an Organized Health Care Education/Training Program | Admitting: Student in an Organized Health Care Education/Training Program

## 2024-05-13 ENCOUNTER — Ambulatory Visit: Admitting: Student in an Organized Health Care Education/Training Program

## 2024-05-13 ENCOUNTER — Encounter: Payer: Self-pay | Admitting: Student in an Organized Health Care Education/Training Program

## 2024-05-13 VITALS — BP 147/85 | HR 65 | Temp 97.7°F | Resp 15 | Ht 62.0 in | Wt 230.0 lb

## 2024-05-13 DIAGNOSIS — G894 Chronic pain syndrome: Secondary | ICD-10-CM | POA: Insufficient documentation

## 2024-05-13 DIAGNOSIS — M961 Postlaminectomy syndrome, not elsewhere classified: Secondary | ICD-10-CM | POA: Insufficient documentation

## 2024-05-13 DIAGNOSIS — M5416 Radiculopathy, lumbar region: Secondary | ICD-10-CM

## 2024-05-13 DIAGNOSIS — G8929 Other chronic pain: Secondary | ICD-10-CM | POA: Diagnosis present

## 2024-05-13 MED ORDER — CEFAZOLIN SODIUM 1 G IJ SOLR
INTRAMUSCULAR | Status: AC
  Filled 2024-05-13: qty 10

## 2024-05-13 MED ORDER — LIDOCAINE HCL 2 % IJ SOLN
20.0000 mL | Freq: Once | INTRAMUSCULAR | Status: AC
  Administered 2024-05-13: 400 mg

## 2024-05-13 MED ORDER — FENTANYL CITRATE (PF) 100 MCG/2ML IJ SOLN
25.0000 ug | INTRAMUSCULAR | Status: DC | PRN
  Administered 2024-05-13: 75 ug via INTRAVENOUS

## 2024-05-13 MED ORDER — MIDAZOLAM HCL 5 MG/5ML IJ SOLN
INTRAMUSCULAR | Status: AC
  Filled 2024-05-13: qty 5

## 2024-05-13 MED ORDER — ROPIVACAINE HCL 2 MG/ML IJ SOLN
INTRAMUSCULAR | Status: AC
  Filled 2024-05-13: qty 20

## 2024-05-13 MED ORDER — FENTANYL CITRATE (PF) 100 MCG/2ML IJ SOLN
INTRAMUSCULAR | Status: AC
  Filled 2024-05-13: qty 2

## 2024-05-13 MED ORDER — ROPIVACAINE HCL 2 MG/ML IJ SOLN
9.0000 mL | Freq: Once | INTRAMUSCULAR | Status: AC
  Administered 2024-05-13: 9 mL via PERINEURAL

## 2024-05-13 MED ORDER — MIDAZOLAM HCL 5 MG/5ML IJ SOLN
0.5000 mg | Freq: Once | INTRAMUSCULAR | Status: AC
  Administered 2024-05-13: 2 mg via INTRAVENOUS

## 2024-05-13 MED ORDER — LACTATED RINGERS IV SOLN: Freq: Once | INTRAVENOUS | Status: AC

## 2024-05-13 MED ORDER — CEPHALEXIN 500 MG PO CAPS: 500.0000 mg | ORAL_CAPSULE | Freq: Four times a day (QID) | ORAL | 0 refills | Status: AC

## 2024-05-13 MED ORDER — LIDOCAINE HCL 2 % IJ SOLN
INTRAMUSCULAR | Status: AC
  Filled 2024-05-13: qty 20

## 2024-05-13 NOTE — Patient Instructions (Addendum)
 " ______________________________________________________________________    Post-Procedure Discharge Instructions  INSTRUCTIONS Apply ice:  Purpose: This will minimize any swelling and discomfort after procedure.  When: Day of procedure, as soon as you get home. How: Fill a plastic sandwich bag with crushed ice. Cover it with a small towel and apply to injection site. How long: (15 min on, 15 min off) Apply for 15 minutes then remove x 15 minutes.  Repeat sequence on day of procedure, until you go to bed. Apply heat:  Purpose: To treat any soreness and discomfort from the procedure. When: Starting the next day after the procedure. How: Apply heat to procedure site starting the day following the procedure. How long: May continue to repeat daily, until discomfort goes away. Food intake: Start with clear liquids (like water ) and advance to regular food, as tolerated.  Physical activities: Keep activities to a minimum for the first 8 hours after the procedure. After that, then as tolerated. Driving: If you have received any sedation, be responsible and do not drive. You are not allowed to drive for 24 hours after having sedation. Blood thinner: (Applies only to those taking blood thinners) You may restart your blood thinner 6 hours after your procedure. Insulin : (Applies only to Diabetic patients taking insulin ) As soon as you can eat, you may resume your normal dosing schedule. Infection prevention: Keep procedure site clean and dry. Shower daily and clean area with soap and water .  PAIN DIARY Post-procedure Pain Diary: Extremely important that this be done correctly and accurately. Recorded information will be used to determine the next step in treatment. For the purpose of accuracy, follow these rules: Evaluate only the area treated. Do not report or include pain from an untreated area. For the purpose of this evaluation, ignore all other areas of pain, except for the treated area. After your  procedure, avoid taking a long nap and attempting to complete the pain diary after you wake up. Instead, set your alarm clock to go off every hour, on the hour, for the initial 8 hours after the procedure. Document the duration of the numbing medicine, and the relief you are getting from it. Do not go to sleep and attempt to complete it later. It will not be accurate. If you received sedation, it is likely that you were given a medication that may cause amnesia. Because of this, completing the diary at a later time may cause the information to be inaccurate. This information is needed to plan your care. Follow-up appointment: Keep your post-procedure follow-up evaluation appointment after the procedure (usually 2 weeks for most procedures, 6 weeks for radiofrequencies). DO NOT FORGET to bring you pain diary with you.   EXPECT... (What should I expect to see with my procedure?) From numbing medicine (AKA: Local Anesthetics): Numbness or decrease in pain. You may also experience some weakness, which if present, could last for the duration of the local anesthetic. Onset: Full effect within 15 minutes of injected. Duration: It will depend on the type of local anesthetic used. On the average, 1 to 8 hours.  From steroids (Applies only if steroids were used): Decrease in swelling or inflammation. Once inflammation is improved, relief of the pain will follow. Onset of benefits: Depends on the amount of swelling present. The more swelling, the longer it will take for the benefits to be seen. In some cases, up to 10 days. Duration: Steroids will stay in the system x 2 weeks. Duration of benefits will depend on multiple posibilities including persistent  irritating factors. Side-effects: If present, they may typically last 2 weeks (the duration of the steroids). Frequent: Cramps (if they occur, drink Gatorade and take over-the-counter Magnesium  450-500 mg once to twice a day); water  retention with temporary weight  gain; increases in blood sugar; decreased immune system response; increased appetite. Occasional: Facial flushing (red, warm cheeks); mood swings; menstrual changes. Uncommon: Long-term decrease or suppression of natural hormones; bone thinning. (These are more common with higher doses or more frequent use. This is why we prefer that our patients avoid having any injection therapies in other practices.)  Very Rare: Severe mood changes; psychosis; aseptic necrosis. From procedure: Some discomfort is to be expected once the numbing medicine wears off. This should be minimal if ice and heat are applied as instructed.  CALL IF... (When should I call?) You experience numbness and weakness that gets worse with time, as opposed to wearing off. New onset bowel or bladder incontinence. (Applies only to procedures done in the spine)  Emergency Numbers: Durning business hours (Monday - Thursday, 8:00 AM - 4:00 PM) (Friday, 9:00 AM - 12:00 Noon): [phone] After hours: [phone] NOTE: If you are having a problem and are unable connect with, or to talk to a provider, then go to your nearest urgent care or emergency department. If the problem is serious and urgent, please call 911. ______________________________________________________________________    Today we did the following -We have done a Spinal Cord Stimulator Trial with [employer]  -As long as the leads are in place, do not bathe or shower. You may sponge bathe.  -While the lead is in place, please limit the bending, lifting, or twisting because the lead can move.  -The things we want to see is if your pain improves (and by what percentage), if you can do more activity (don't overdo it), and if you can use less of your as needed medicine. Do not stop long acting medicines like methadone, oxycontin , MS Contin , etc without checking with us .  -It is VERY important that you pick up the antibiotics we prescribed, Keflex , on your way home  from the trial and take them as prescribed(4 times a day), starting today, for as long as the lead is in place.  -The Spina Cord Stimulator Representative will be in contact with you while the lead is in place to make sure the trial goes as well as possible.  -Please contact us  with any questions or concerns at any time during the trial.   -If you start running a fever over 100 degrees, have severe back pain, or new pain running down the legs, or drainage coming from the lead site, contact us  immediately and/or go to the emergency room.  -Please do not restart any sort of medication that can thin your blood such as Aspirin , ibuprofen, motrin, aleve, plavix , coumadin, etc. If you aren't sure, call and ask.  -We will have you return on Monday to have the lead removed. If this is successful, at that point we can go over the details about the permanent implant.   "

## 2024-05-13 NOTE — Progress Notes (Signed)
 PROVIDER NOTE: Interpretation of information contained herein should be left to medically-trained personnel. Specific patient instructions are provided elsewhere under Patient Instructions section of medical record. This document was created in part using STT-dictation technology, any transcriptional errors that may result from this process are unintentional.  Patient: Susan [name] Type: Established DOB: [birth year] MRN: [id number] PCP: Abbey Bruckner, MD  Service: Procedure DOS: 05/13/2024 Setting: Ambulatory Location: Ambulatory outpatient facility Delivery: Face-to-face Provider: Wallie Sherry, MD Specialty: Interventional Pain Management Specialty designation: 09 Location: Outpatient facility Ref. Prov.: Sherry Wallie, MD       Interventional Therapy   Primary Reason for Admission: Surgical management of chronic pain condition.   Procedure:              Type: Trial Spinal Cord Neurostimulator Implant (Percutaneous, interlaminar, posterior epidural placement) Laterality: Right (-RT)  Level: Lumbar  Imaging: Fluoroscopic guidance Anesthesia: Local anesthesia (1-2% Lidocaine ) Sedation: Moderate Sedation                       DOS: 05/13/2024  Performed by: Wallie Sherry, MD  Purpose: Diagnostic. To determine if a permanent implant may be effective in controlling some or all of Susan [name]'s chronic pain symptoms.  Rationale (medical necessity): procedure needed and proper for the diagnosis and/or treatment of Susan [name]'s medical symptoms and needs. Susan [name] is a 64 year old female with a history of prior spine surgery and prior postoperative complications, including CSF leak and lumbar hematoma, who presents with chronic neuropathic pain and numbness following spine surgery. She was referred by Dr. Clois for evaluation and consideration of spinal cord stimulator (SCS) trial. She is here today for her SCS trial.  1. Lumbar post-laminectomy syndrome   2. Failed back  surgical syndrome   3. Chronic radicular lumbar pain   4. Chronic pain syndrome    NAS-11 Pain score:   Pre-procedure: 7 /10   Post-procedure: 0-No pain/10     Target: Posterior epidural space over the dorsal columns of the spinal cord. Location: Posterior intraspinal canal Region: Thoracolumbar  Approach: Translaminar percutaneous  Type of procedure: Surgical   Position / Prep / Materials:  Position: Prone  Prep solution: ChloraPrep (2% chlorhexidine  gluconate and 70% isopropyl alcohol) Prep Area: Entire Posterior Thoracolumbar Region  Materials:  Tray: Implant tray        Needle(s):  Type: Epidural  Gauge (G): 14  Length: Regular (10cm)  Qty: 1  H&P (Pre-op Assessment):  Susan [name] is a 64 y.o. (year old), female patient, seen today for interventional treatment. She  has a past surgical history that includes Breast surgery (Left, 20 yrs ago); Cesarean section; Bilateral salpingoophorectomy; Tonsillectomy; Tubal ligation; Total knee arthroplasty (Right, 05/10/2016); Breast biopsy (Left); Colonoscopy; Colonoscopy with propofol  (N/A, 10/16/2017); Joint replacement (Right); Hernia repair (06/2011, July 2014); Hernia repair; Total hip arthroplasty (Left, 07/23/2019); Colonoscopy with propofol  (N/A, 02/16/2021); Foraminotomy 1 level (Right, 10/10/2022); and Lumbar laminectomy/ decompression with met-rx (N/A, 10/12/2022).  Initial Vital Signs:  Pulse/EKG Rate: 65ECG Heart Rate: 62 (nsr) Temp: 97.9 F (36.6 C) Resp: 18 BP: 136/73 SpO2: 99 %  BMI: Estimated body mass index is 42.07 kg/m as calculated from the following:   Height as of this encounter: 5' 2 (1.575 m).   Weight as of this encounter: 230 lb (104.3 kg).  Risk Assessment: Allergies: Reviewed. She is allergic to amlodipine , wellbutrin  [bupropion ], furosemide, and hydrochlorothiazide .  Allergy Precautions: None required Coagulopathies: Reviewed. None identified.  Blood-thinner therapy: None at this time Active  Infection(s): Reviewed. None identified. Susan [name] is afebrile  Site Confirmation: Susan [name] was asked to confirm the procedure and laterality before marking the site, which she did. Procedure checklist: Completed Consent: Before the procedure and under the influence of no sedative(s), amnesic(s), or anxiolytics, the patient was informed of the treatment options, risks and possible complications. To fulfill our ethical and legal obligations, as recommended by the American Medical Association's Code of Ethics, I have informed the patient of my clinical impression; the nature and purpose of the treatment or procedure; the risks, benefits, and possible complications of the intervention; the alternatives, including doing nothing; the risk(s) and benefit(s) of the alternative treatment(s) or procedure(s); and the risk(s) and benefit(s) of doing nothing.  Susan [name] was provided with information about the general risks and possible complications associated with most interventional procedures. These include, but are not limited to: failure to achieve desired goals, infection, bleeding, organ or nerve damage, allergic reactions, paralysis, and/or death.  In addition, she was informed of those risks and possible complications associated to this particular procedure, which include, but are not limited to: damage to the implant; failure to decrease pain; local, systemic, or serious CNS infections, intraspinal abscess with possible cord compression and paralysis, or life-threatening such as meningitis; intrathecal and/or epidural bleeding with formation of hematoma with possible spinal cord compression and permanent paralysis; organ damage; nerve injury or damage with subsequent sensory, motor, and/or autonomic system dysfunction, resulting in transient or permanent pain, numbness, and/or weakness of one or several areas of the body; allergic reactions, either minor or major life-threatening, such as anaphylactic  or anaphylactoid reactions.  Furthermore, Susan [name] was informed of those risks and complications associated with the medications. These include, but are not limited to: allergic reactions (i.e.: anaphylactic or anaphylactoid reactions); arrhythmia;  Hypotension/hypertension; cardiovascular collapse; respiratory depression and/or shortness of breath; swelling or edema; medication-induced neural toxicity; particulate matter embolism and blood vessel occlusion with resultant organ, and/or nervous system infarction and permanent paralysis.  Finally, she was informed that Medicine is not an exact science; therefore, there is also the possibility of unforeseen or unpredictable risks and/or possible complications that may result in a catastrophic outcome. The patient indicated having understood very clearly. We have given the patient no guarantees and we have made no promises. Enough time was given to the patient to ask questions, all of which were answered to the patient's satisfaction. Susan [name] has indicated that she wanted to continue with the procedure. Attestation: I, the ordering provider, attest that I have discussed with the patient the benefits, risks, side-effects, alternatives, likelihood of achieving goals, and potential problems during recovery for the procedure that I have provided informed consent. Date  Time: 05/13/2024  7:45 AM  Pre-Procedure Preparation:  Monitoring: As per clinic protocol. Respiration, ETCO2, SpO2, BP, heart rate and rhythm monitor placed and checked for adequate function Safety Precautions: Patient was assessed for positional comfort and pressure points before starting the procedure. Time-out: I initiated and conducted the Time-out before starting the procedure, as per protocol. The patient was asked to participate by confirming the accuracy of the Time Out information. Verification of the correct person, site, and procedure were performed and confirmed by me, the  nursing staff, and the patient. Time-out conducted as per Joint Commission's Universal Protocol (UP.01.01.01). Time: 0030 Start Time: 0830 hrs.  Description/Narrative of Procedure:          Rationale (medical necessity): procedure needed and proper for the diagnosis and/or treatment of the patient's  medical symptoms and needs. Procedural Technique Safety Precautions: Aspiration looking for blood return was conducted prior to all injections. At no point did we inject any substances, as a needle was being advanced. No attempts were made at seeking any paresthesias. Safe injection practices and needle disposal techniques used. Medications properly checked for expiration dates. SDV (single dose vial) medications used. Description of the Procedure: Protocol guidelines were followed. The patient was assisted into a comfortable position. The target area was identified and the area prepped in the usual manner. Skin & deeper tissues infiltrated with local anesthetic. Appropriate amount of time allowed to pass for local anesthetics to take effect. The procedure needles were then advanced to the target area. Proper needle placement secured. Negative aspiration confirmed. Solution injected in intermittent fashion, asking for systemic symptoms every 0.5cc of injectate. The needles were then removed and the area cleansed, making sure to leave some of the prepping solution back to take advantage of its long term bactericidal properties.  Technical description of procedure: Availability of a responsible, adult driver, and NPO status confirmed. Informed consent was obtained after having discussed risks and possible complications. An IV was started. The patient was then taken to the fluoroscopy suite, where the patient was placed in position for the procedure, over the fluoroscopy table. The patient was then monitored in the usual manner. Fluoroscopy was manipulated to obtain the best possible view of the target. Parallex  error was corrected before commencing the procedure. Once a clear view of the target had been obtained, the skin and deeper tissues over the procedure site were infiltrated using lidocaine , loaded in a 10 cc luer-loc syringe with a 0.5 inch, 25-G needle. The introducer needle(s) was/were then inserted through the skin and deeper tissues. A paramidline approach was used to enter the posterior epidural space at a 30 angle, using Loss-of-resistance Technique with 3 ml of PF-NaCl (0.9% NSS). Correct needle placement was confirmed in the antero-posterior and lateral fluoroscopic views. The lead was gently introduced and manipulated under real-time fluoroscopy, constantly assessing for pain, discomfort, or paresthesias, until the tip rested at the desired level.    Electrode placement was tested until appropriate coverage was attained. Once the patient confirmed that the stimulation was over the desired area, the lead(s) was/were secured in place and the introducer needles removed. This was done under real-time fluoroscopy while observing the electrode tip to avoid unintended migration. The area was covered with a non-occlusive dressing and the patient transported to recovery for further programming.  Vitals:   05/13/24 0928 05/13/24 0938 05/13/24 0948 05/13/24 1000  BP: 135/74 134/75 (!) 104/91 (!) 147/85  Pulse:      Resp: 15 15 20 15   Temp: 97.7 F (36.5 C)   97.7 F (36.5 C)  TempSrc:      SpO2: 94% 99% 95% 96%  Weight:      Height:        Start Time: 0830 hrs. End Time: 0918 hrs.  Neurostimulator Details:  Lead(s):  Brand: [employer]         Epidural Access Level:  L1-2  Lead implant:  Right  No. of Electrodes/Lead:  8  Laterality:  midline  Location (Top electrode):  T7 (mid)  Model No.: [id number]  Length: 60 cm  Lot No.: [id number]  MRI compatibility:  Conditional   Imaging Guidance (Spinal):         Type of Imaging Technique: Fluoroscopy Guidance (Spinal) Indication(s):  Fluoroscopy guidance for needle placement to enhance accuracy in procedures  requiring precise needle localization for targeted delivery of medication in or near specific anatomical locations not easily accessible without such real-time imaging assistance. Exposure Time: Please see nurses notes. Contrast: None used. Fluoroscopic Guidance: I was personally present during the use of fluoroscopy. Tunnel Vision Technique used to obtain the best possible view of the target area. Parallax error corrected before commencing the procedure. Direction-depth-direction technique used to introduce the needle under continuous pulsed fluoroscopy. Once target was reached, antero-posterior, oblique, and lateral fluoroscopic projection used confirm needle placement in all planes. Images permanently stored in EMR. Interpretation: No contrast injected. I personally interpreted the imaging intraoperatively. Adequate needle placement confirmed in multiple planes. Permanent images saved into the patient's record.  Antibiotic Prophylaxis:   Anti-infectives (From admission, onward)    Start     Dose/Rate Route Frequency Ordered Stop   05/13/24 0000  cephALEXin  (KEFLEX ) 500 MG capsule        500 mg Oral 4 times daily 05/13/24 0826 05/20/24 2359      ANCEF  2 g at case start, Keflex  during 7 day SCS trial  Indication(s): Surgical Prophylaxis.  Post-operative Assessment:  Post-procedure Vital Signs:  Pulse/HCG Rate: 6560 Temp: 97.7 F (36.5 C) Resp: 15 BP: (!) 147/85 SpO2: 96 %  Complications:   Under fluoroscopic guidance, an attempt was made to access the epidural space at T12-L1. Care was taken to avoid advancing the needle deeply. Upon entering the epidural space, there was an immediate gush of clear cerebrospinal fluid consistent with inadvertent dural puncture. The needle was promptly withdrawn without further manipulation. A new approach was then taken, and epidural access was subsequently achieved at the  L1-L2 level.  Attempts were made to place a left-sided lead; however, due to significant osteophytes and collapsed interlaminar windows, percutaneous access was technically difficult. A single midline lead was successfully advanced and provided adequate coverage of the patients low back and predominantly right-sided leg pain.  The event and findings were disclosed to the patient at bedside, as well as to the nursing staff. The patient was instructed regarding post-procedure precautions and signs/symptoms of potential CSF leak or neurologic change, including positional headache, nausea, photophobia, severe back pain, worsening weakness, bowel/bladder changes, or persistent drainage. She was advised to notify our office immediately or present to the emergency department should symptoms develop or worsen. Close follow-up was arranged.  The patient tolerated the procedure without acute neurologic deficit and was discharged in stable condition.  I evaluated the patient again prior to discharge and she was sitting upright in her wheelchair comfortable without any headaches vision changes photophobia or phonophobia.  Dressing site also appears fine, no excessive drainage or blood noted.  Note: Post-procedural neurological assessment was performed, showing return to baseline, prior to discharge. The patient was provided with post-procedure discharge instructions, including a section on how to identify potential problems. Should any problems arise concerning this procedure, the patient was given instructions to immediately contact us , at any time, without hesitation. In any case, we plan to contact the patient by telephone for a follow-up status report regarding this interventional procedure.  Comments:  No additional relevant information.  Plan of Care  5 out of 5 strength bilateral lower extremity: Plantar flexion, dorsiflexion, knee flexion, knee extension. No headaches noted upon discharge  Orders:   Orders Placed This Encounter  Procedures   DG PAIN [facility] C-ARM 1-60 MIN NO REPORT    Intraoperative interpretation by procedural physician at [facility].    Standing Status:  Standing    Number of Occurrences:   1    Reason for exam::   Assistance in needle guidance and placement for procedures requiring needle placement in or near specific anatomical locations not easily accessible without such assistance.     Medications administered: We administered lidocaine , lactated ringers , midazolam , fentaNYL , and ropivacaine  (PF) 2 mg/mL (0.2%).  See the medical record for exact dosing, route, and time of administration.    Follow-up plan:   Return in about 1 week (around 05/20/2024) for SCS lead pull.     Recent Visits Date Type Provider Dept  02/15/24 Office Visit Marcelino Nurse, MD [facility]  Showing recent visits within past 90 days and meeting all other requirements Today's Visits Date Type Provider Dept  05/13/24 Procedure visit Marcelino Nurse, MD [facility]  Showing today's visits and meeting all other requirements Future Appointments Date Type Provider Dept  05/20/24 Appointment Marcelino Nurse, MD [facility]  Showing future appointments within next 90 days and meeting all other requirements  Disposition: Discharge home  Discharge (Date  Time): 05/13/2024; 1001 hrs.   Primary Care Physician: Bair, Kalpana, MD Location: [facility] Note by: Nurse Marcelino, MD (TTS technology used. I apologize for any typographical errors that were not detected and corrected.) Date: 05/13/2024; Time: 10:10 AM

## 2024-05-14 ENCOUNTER — Encounter: Payer: Self-pay | Admitting: Psychiatry

## 2024-05-14 ENCOUNTER — Other Ambulatory Visit: Payer: Self-pay

## 2024-05-14 ENCOUNTER — Ambulatory Visit

## 2024-05-14 ENCOUNTER — Ambulatory Visit: Admitting: Psychiatry

## 2024-05-14 VITALS — BP 128/84 | HR 77 | Temp 98.3°F | Ht 62.0 in | Wt 228.4 lb

## 2024-05-14 DIAGNOSIS — F4310 Post-traumatic stress disorder, unspecified: Secondary | ICD-10-CM

## 2024-05-14 DIAGNOSIS — F3176 Bipolar disorder, in full remission, most recent episode depressed: Secondary | ICD-10-CM

## 2024-05-14 DIAGNOSIS — F1021 Alcohol dependence, in remission: Secondary | ICD-10-CM

## 2024-05-14 DIAGNOSIS — F17200 Nicotine dependence, unspecified, uncomplicated: Secondary | ICD-10-CM | POA: Diagnosis not present

## 2024-05-14 DIAGNOSIS — F1421 Cocaine dependence, in remission: Secondary | ICD-10-CM

## 2024-05-14 LAB — LAB REPORT - SCANNED: Microalb Creat Ratio: <30

## 2024-05-14 NOTE — Progress Notes (Unsigned)
 [facility] MD OP Progress Note  05/14/2024 11:17 AM Susan SELVY  MRN:  [id number]  Chief Complaint:  Chief Complaint  Patient presents with   Follow-up   Depression   Anxiety   Medication Refill   Discussed the use of AI scribe software for clinical note transcription with the patient, who gave verbal consent to proceed.  History of Present Illness Susan [name] is a 64 year old Caucasian female, currently unemployed, widowed, lives currently in [place] in a senior living community, has a history of bipolar disorder, PTSD, alcohol and cocaine use disorder in remission, tobacco use disorder, degenerative disc disease, obstructive sleep apnea on CPAP, history of total hip replacement, osteoarthritis, hepatitis C, was evaluated in office today for a follow-up appointment.  Describing her bipolar symptoms as generally well controlled, she states she feels she is doing pretty good and experiences only occasional days when she feels off. She denies depression, anxiety, mania, hypomanic symptoms, or psychosis, including auditory or visual hallucinations. Regarding sleep, she notes good quality and reports sleeping well the previous night. She does not report any issues with memory, concentration, or managing daily tasks such as bills and household responsibilities. Adjusting to retirement, she explores ways to stay engaged, including considering hobbies or joining a gym.  She reports appetite is fair although sometimes she limits her intake due to not wanting to clean up after cooking.  Her current medication regimen includes duloxetine  20 mg and 30 mg daily, Latuda  20 mg daily with supper, and trazodone  50 mg at bedtime.Substance History: She reports current tobacco use, smoking approximately 2 cigarettes per day. She participates in Alcoholics Anonymous (AA) and remains active in the program.  She walks her dog for exercise. She attends activities such as bingo where residing.  She is considering joining a gym.She reports she underwent spinal stimulator placement yesterday.  She is currently recovering from the same and continues to have pain.  She continues to smoke cigarettes although she has cut back to 2 cigarettes/day.  Visit Diagnosis:    ICD-10-CM   1. Bipolar disorder, in full remission, most recent episode depressed  F31.76     2. PTSD (post-traumatic stress disorder)  F43.10     3. Tobacco use disorder  F17.200    Mild    4. Alcohol use disorder, severe, in sustained remission (HCC)  F10.21     5. Cocaine use disorder, moderate, in sustained remission (HCC)  F14.21       Past Psychiatric History: I have reviewed past psychiatric history from progress note on 09/27/2019.  Past trials of Seroquel, Cymbalta , Abilify, risperidone   Past Medical History:  Past Medical History:  Diagnosis Date   ADD (attention deficit disorder)    Alcohol abuse    Anemia    Anxiety    Aortic atherosclerosis 02/20/2018   Chest CT Sept 2019   Arthritis    rheumatoid arthritis   Asthma    Bipolar disorder (HCC)    Centrilobular emphysema (HCC)    Chronic diarrhea 07/22/2022   Cocaine use disorder, moderate, in sustained remission (HCC)    Constipation    Degenerative disc disease at L5-S1 level 09/28/2016   See ortho note May 2018   Depression    bipolar, hx of suicide attempt   Diabetes mellitus, type 2 (HCC)    Diverticulitis 2013   DOE (dyspnea on exertion)    Drug use    Family history of adverse reaction to anesthesia    mom-delayed emergence  GERD (gastroesophageal reflux disease)    H/O suicide attempt    slit wrists   Hepatitis C 06/26/2014   treated with Harvoni   Hip pain    History of echocardiogram    a. 04/2019 Echo: EF >65%, nl RV fxn.   History of MRSA infection 2013   HLD (hyperlipidemia)    Hypertension    a. 06/2021 Renal Duplex: ? bilat RAS; b. 06/2021 CTA Abd: No signif RAS.   Hyponatremia    Hypothyroidism    Incisional hernia  11/08/2012   Knee pain    Left carotid bruit    Morbid obesity (HCC)    Multinodular thyroid     OSA (obstructive sleep apnea)    OSA on CPAP    Osteoporosis    Palpitations    Post-traumatic osteoarthritis of right knee 09/24/2015   Recurrent ventral hernia 11/08/2012   Skin lesion 06/28/2022   Status post total hip replacement, left 07/23/2019   Status post total right knee replacement using cement 05/10/2016   Stroke (HCC) 07/05/2022   right arm,torso and right leg numbness   Tobacco use disorder    Tobacco use disorder, continuous 06/26/2014   Vitamin B12 deficiency    Vitamin D  deficiency 04/09/2015    Past Surgical History:  Procedure Laterality Date   BILATERAL SALPINGOOPHORECTOMY     due to abnormal mass   BREAST BIOPSY Left    neg   BREAST SURGERY Left 20 yrs ago   CESAREAN SECTION     COLONOSCOPY     COLONOSCOPY WITH PROPOFOL  N/A 10/16/2017   Procedure: COLONOSCOPY WITH PROPOFOL ;  Surgeon: Therisa Bi, MD;  Location: [facility] ENDOSCOPY;  Service: Gastroenterology;  Laterality: N/A;   COLONOSCOPY WITH PROPOFOL  N/A 02/16/2021   Procedure: COLONOSCOPY WITH PROPOFOL ;  Surgeon: Therisa Bi, MD;  Location: [facility] ENDOSCOPY;  Service: Gastroenterology;  Laterality: N/A;   FORAMINOTOMY 1 LEVEL Right 10/10/2022   Procedure: RIGHT L4-5 LAMINOFORAMINOTOMY;  Surgeon: Clois Fret, MD;  Location: [facility] ORS;  Service: Neurosurgery;  Laterality: Right;   HERNIA REPAIR  06/2011, July 2014   Ventral wall repair with Physiomesh   HERNIA REPAIR     2nd.vental wall repair   JOINT REPLACEMENT Right    knee   LUMBAR LAMINECTOMY/ DECOMPRESSION WITH MET-RX N/A 10/12/2022   Procedure: LUMBAR LAMINECTOMY/ DECOMPRESSION WITH MET-RX;  Surgeon: Clois Fret, MD;  Location: [facility] ORS;  Service: Neurosurgery;  Laterality: N/A;   TONSILLECTOMY     TOTAL HIP ARTHROPLASTY Left 07/23/2019   Procedure: TOTAL HIP ARTHROPLASTY;  Surgeon: Edie Norleen PARAS, MD;  Location: [facility] ORS;  Service: Orthopedics;   Laterality: Left;   TOTAL KNEE ARTHROPLASTY Right 05/10/2016   Procedure: TOTAL KNEE ARTHROPLASTY;  Surgeon: Norleen PARAS Edie, MD;  Location: [facility] ORS;  Service: Orthopedics;  Laterality: Right;   TUBAL LIGATION      Family Psychiatric History: I have reviewed family psychiatric history from progress note on 09/27/2019.  Family History:  Family History  Problem Relation Age of Onset   Arthritis Mother    Asthma Mother    Mental illness Mother    Thyroid  disease Mother    COPD Mother    Heart disease Mother    Congestive Heart Failure Mother    Alcohol abuse Mother    Eating disorder Mother    Bipolar disorder Mother    Alcohol abuse Father    Heart attack Father    Alcohol abuse Sister    Drug abuse Sister    Mental illness Sister  Mental illness Sister    Fibromyalgia Sister    Obesity Sister    Pneumonia Sister    Depression Sister    Mental illness Sister    Alcohol abuse Sister    Drug abuse Sister    Arthritis Brother    Mental illness Brother    Cancer Brother        non-hodkins lymphoma   Drug abuse Daughter    Drug abuse Son    Alcohol abuse Son    Drug abuse Son    Alcohol abuse Son    Diabetes Neg Hx    Stroke Neg Hx    Breast cancer Neg Hx    Thyroid  cancer Neg Hx     Social History: I have reviewed social history from progress note on 09/27/2019. Social History   Socioeconomic History   Marital status: Widowed    Spouse name: Not on file   Number of children: 3   Years of education: Not on file   Highest education level: Some college, no degree  Occupational History   Occupation: disabled  Tobacco Use   Smoking status: Every Day    Current packs/day: 0.50    Average packs/day: 0.5 packs/day for 49.0 years (24.5 ttl pk-yrs)    Types: Cigarettes    Start date: 16   Smokeless tobacco: Never   Tobacco comments:    Smokes about 2 cigarettes a day, vapes daily  Vaping Use   Vaping status: Every Day   Substances: Nicotine   Substance and Sexual  Activity   Alcohol use: No    Alcohol/week: 0.0 standard drinks of 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   Drug use: No    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   Sexual activity: Not Currently  Other Topics Concern   Not on file  Social History Narrative   Not on file   Social Drivers of Health   Tobacco Use: High Risk (05/14/2024)   Patient History    Smoking Tobacco Use: Every Day    Smokeless Tobacco Use: Never    Passive Exposure: Not on file  Financial Resource Strain: Low Risk (04/01/2024)   Overall Financial Resource Strain (CARDIA)    Difficulty of Paying Living Expenses: Not hard at all  Food Insecurity: No Food Insecurity (04/01/2024)   Epic    Worried About Radiation Protection Practitioner of Food in the Last Year: Never true    Ran Out of Food in the Last Year: Never true  Transportation Needs: No Transportation Needs (04/01/2024)   Epic    Lack of Transportation (Medical): No    Lack of Transportation (Non-Medical): No  Physical Activity: Inactive (04/01/2024)   Exercise Vital Sign    Days of Exercise per Week: 0 days    Minutes of Exercise per Session: 0 min  Stress: No Stress Concern Present (04/01/2024)   [employer] of Occupational Health - Occupational Stress Questionnaire    Feeling of Stress: Only a little  Social Connections: Moderately Isolated (04/01/2024)   Social Connection and Isolation Panel    Frequency of Communication with Friends and Family: More than three times a week    Frequency of Social Gatherings with Friends and Family: Twice a week    Attends Religious Services: Never    Database Administrator or Organizations: Yes    Attends Engineer, Structural: More than 4 times per year    Marital Status: Widowed  Depression (PHQ2-9): Low Risk (05/14/2024)  Depression (PHQ2-9)    PHQ-2 Score: 1  Recent Concern: Depression (PHQ2-9) - Medium Risk (04/24/2024)   Depression (PHQ2-9)    PHQ-2 Score: 6  Alcohol Screen: Low Risk  (04/01/2024)   Alcohol Screen    Last Alcohol Screening Score (AUDIT): 0  Housing: Low Risk (04/01/2024)   Epic    Unable to Pay for Housing in the Last Year: No    Number of Times Moved in the Last Year: 0    Homeless in the Last Year: No  Utilities: Not At Risk (04/01/2024)   Epic    Threatened with loss of utilities: No  Health Literacy: Adequate Health Literacy (04/01/2024)   B1300 Health Literacy    Frequency of need for help with medical instructions: Never    Allergies: Allergies[1]  Metabolic Disorder Labs: Lab Results  Component Value Date   HGBA1C 5.4 04/24/2024   MPG 105 12/04/2023   MPG 108 04/08/2019   No results found for: PROLACTIN Lab Results  Component Value Date   CHOL 98 12/04/2023   TRIG 87 12/04/2023   HDL 42 (L) 12/04/2023   CHOLHDL 2.3 12/04/2023   VLDL 33 07/06/2022   LDLCALC 39 12/04/2023   LDLCALC 128 (H) 07/06/2022   Lab Results  Component Value Date   TSH 1.06 03/28/2023   TSH 1.15 04/28/2021    Therapeutic Level Labs: No results found for: LITHIUM No results found for: VALPROATE No results found for: CBMZ  Current Medications: Current Outpatient Medications  Medication Sig Dispense Refill   acetaminophen  (TYLENOL ) 500 MG tablet Take 1,000 mg by mouth 2 (two) times daily. (Patient not taking: Reported on 05/13/2024)     aspirin  EC 81 MG tablet Take 1 tablet (81 mg total) by mouth daily. *Swallow whole* 90 tablet 3   carvedilol  (COREG ) 12.5 MG tablet Take 1 tablet (12.5 mg total) by mouth 2 (two) times daily with a meal. 180 tablet 3   cephALEXin  (KEFLEX ) 500 MG capsule Take 1 capsule (500 mg total) by mouth 4 (four) times daily for 7 days. 28 capsule 0   clotrimazole  (LOTRIMIN ) 1 % cream Apply 1 application  topically 2 (two) times daily as needed.     cyclobenzaprine  (FLEXERIL ) 5 MG tablet Take 2.5-5 mg by mouth 2 (two) times daily as needed.     DULoxetine  (CYMBALTA ) 20 MG capsule Take 1 capsule (20 mg total) by mouth daily.  *Take with 30 mg capsule* 30 capsule 7   DULoxetine  (CYMBALTA ) 30 MG capsule Take 1 capsule (30 mg total) by mouth daily. *Take with 20 mg capsule.* 30 capsule 9   estradiol  (ESTRACE ) 0.01 % CREA vaginal cream Apply 1 gram daily per vagina for 2 weeks and then three times weekly 42.5 g 12   gabapentin  (NEURONTIN ) 300 MG capsule Take 2 capsules (600 mg total) by mouth 2 (two) times daily. 360 capsule 1   HYDROcodone -acetaminophen  (NORCO/VICODIN) 5-325 MG tablet Take 1 tablet by mouth daily as needed for moderate pain.     lurasidone  (LATUDA ) 20 MG TABS tablet Take 1 tablet (20 mg total) by mouth daily with supper. 30 tablet 2   naloxone (NARCAN) nasal spray 4 mg/0.1 mL Place into the nose.     rosuvastatin  (CRESTOR ) 20 MG tablet Take 1 tablet (20 mg total) by mouth daily. 90 tablet 3   Semaglutide  (OZEMPIC , 2 MG/DOSE, [place]) Inject 2 mg into the skin every 7 (seven) days.     telmisartan  (MICARDIS ) 80 MG tablet Take 1 tablet (80 mg  total) by mouth daily. 90 tablet 3   tirzepatide  (ZEPBOUND ) 2.5 MG/0.5ML Pen Inject 2.5 mg into the skin once a week. (Patient not taking: Reported on 05/13/2024) 2 mL 0   tirzepatide  (ZEPBOUND ) 5 MG/0.5ML Pen Inject 5 mg into the skin once a week. (Patient not taking: Reported on 05/13/2024) 6 mL 1   traZODone  (DESYREL ) 50 MG tablet Take 1 tablet (50 mg total) by mouth at bedtime as needed for sleep. for sleep 30 tablet 2   Vibegron  (GEMTESA ) 75 MG TABS Take 1 tablet (75 mg total) by mouth daily. 90 tablet 3   vitamin B-12 (CYANOCOBALAMIN ) 100 MCG tablet Take 1 tablet (100 mcg total) by mouth daily. 90 tablet 3   No current facility-administered medications for this visit.     Musculoskeletal: Strength & Muscle Tone: within normal limits Gait & Station: Walks with walker Patient leans: Front  Psychiatric Specialty Exam: Review of Systems  Psychiatric/Behavioral: Negative.      Blood pressure 128/84, pulse 77, temperature 98.3 F (36.8 C), temperature source  Temporal, height 5' 2 (1.575 m), weight 228 lb 6.4 oz (103.6 kg).Body mass index is 41.77 kg/m.  General Appearance: Casual  Eye Contact:  Fair  Speech:  Clear and Coherent  Volume:  Normal  Mood:  Euthymic  Affect:  Congruent  Thought Process:  Goal Directed and Descriptions of Associations: Intact  Orientation:  Full (Time, Place, and Person)  Thought Content: Logical   Suicidal Thoughts:  No  Homicidal Thoughts:  No  Memory:  Immediate;   Fair Recent;   Fair Remote;   Fair  Judgement:  Fair  Insight:  Fair  Psychomotor Activity:  Normal  Concentration:  Concentration: Fair and Attention Span: Fair  Recall:  [employer] of Knowledge: Fair  Language: Fair  Akathisia:  No  Handed:  Right  AIMS (if indicated): done  Assets:  Communication Skills Desire for Improvement Housing Social Support Transportation  ADL's:  Intact  Cognition: WNL  Sleep:  Fair   Screenings: Geneticist, Molecular Office Visit from 05/14/2024 in [facility] Office Visit from 10/09/2023 in [facility] Video Visit from 03/24/2022 in [facility] Video Visit from 01/20/2022 in [facility] Video Visit from 10/21/2021 in [facility]  AIMS Total Score 0 0 0 0 0   GAD-7    Flowsheet Row Office Visit from 05/14/2024 in [facility] Office Visit from 04/24/2024 in [facility] Visit from 12/22/2023 in [facility] Visit from 12/04/2023 in [facility] Visit from 11/03/2023 in [facility] [employer]  Total GAD-7 Score 2 2 0 1 1   PHQ2-9    Flowsheet Row Office Visit from 05/14/2024 in [facility] Procedure visit from 05/13/2024 in [facility] Visit from 04/24/2024 in [facility] Clinical Support from 04/01/2024 in [facility] [place] [facility] Visit from 02/15/2024 in [facility] Total Score 1 0 3 1 0  PHQ-9 Total Score -- -- 6 4 --   Flowsheet Row Office Visit from 05/14/2024 in [facility] Video Visit  from 01/15/2024 in [facility] ED from 11/17/2023 in [facility] Department at [facility]  C-SSRS RISK CATEGORY Moderate Risk Moderate Risk No Risk     Assessment and Plan: ROMEY COHEA is a 64 year old Caucasian female who presented for a follow-up appointment, discussed assessment and plan as noted below.  1. Bipolar disorder, in full remission, most recent episode depressed Currently denies any depression or manic symptoms Continue Latuda  20 mg daily with supper  2. PTSD (post-traumatic stress disorder)-stable Currently denies any trauma related symptoms Continue Cymbalta  50 mg daily Continue Trazodone  50 mg at bedtime as needed  3. Tobacco use disorder mild-improving Currently smokes couple of cigarettes per day only.  Interested in quitting. Will reevaluate in future sessions.  4. Alcohol use disorder, severe, in sustained remission (HCC) Currently sober.  Continues to attend AA meetings  5. Cocaine use disorder, moderate, in sustained remission (HCC) Currently sober.  Follow-up Follow-up in clinic in 3 months or sooner if needed.   Consent: Patient/Guardian gives verbal consent for treatment and assignment of benefits for services provided during this visit. Patient/Guardian expressed understanding and agreed to proceed.  This note was  generated in part or whole with voice recognition software. Voice recognition is usually quite accurate but there are transcription errors that can and very often do occur. I apologize for any typographical errors that were not detected and corrected.     Chenel Wernli, MD 05/15/2024, 8:16 AM     [1]  Allergies Allergen Reactions   Amlodipine      Lower leg edema    Wellbutrin  [Bupropion ]     Patient reports made  deathly sick  years ago at appointment 03/18/20.   Furosemide Other (See Comments)    Electrolyte imbalance  Other Reaction(s): Other (See Comments)  Electrolyte imbalance Electrolyte imbalance Electrolyte imbalance    Electrolyte imbalance   Hydrochlorothiazide  Other (See Comments)    Electrolyte imbalance  Other Reaction(s): Dizziness, Other (See Comments)

## 2024-05-15 ENCOUNTER — Ambulatory Visit

## 2024-05-15 NOTE — Therapy (Incomplete)
 " OUTPATIENT PHYSICAL THERAPY THORACOLUMBAR AND BALANCE TREATMENT  Patient Name: Susan [name] MRN: [id number] DOB:January 11, 1961, 64 y.o., female Today's Date: 05/15/2024  END OF SESSION:            Past Medical History:  Diagnosis Date   ADD (attention deficit disorder)    Alcohol abuse    Anemia    Anxiety    Aortic atherosclerosis 02/20/2018   Chest CT Sept 2019   Arthritis    rheumatoid arthritis   Asthma    Bipolar disorder (HCC)    Centrilobular emphysema (HCC)    Chronic diarrhea 07/22/2022   Cocaine use disorder, moderate, in sustained remission (HCC)    Constipation    Degenerative disc disease at L5-S1 level 09/28/2016   See ortho note May 2018   Depression    bipolar, hx of suicide attempt   Diabetes mellitus, type 2 (HCC)    Diverticulitis 2013   DOE (dyspnea on exertion)    Drug use    Family history of adverse reaction to anesthesia    mom-delayed emergence   GERD (gastroesophageal reflux disease)    H/O suicide attempt    slit wrists   Hepatitis C 06/26/2014   treated with Harvoni   Hip pain    History of echocardiogram    a. 04/2019 Echo: EF >65%, nl RV fxn.   History of MRSA infection 2013   HLD (hyperlipidemia)    Hypertension    a. 06/2021 Renal Duplex: ? bilat RAS; b. 06/2021 CTA Abd: No signif RAS.   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   Status post total right knee replacement using cement 05/10/2016   Stroke (HCC) 07/05/2022   right arm,torso and right leg numbness   Tobacco use disorder    Tobacco use disorder, continuous 06/26/2014   Vitamin B12 deficiency     Vitamin D  deficiency 04/09/2015   Past Surgical History:  Procedure Laterality Date   BILATERAL SALPINGOOPHORECTOMY     due to abnormal mass   BREAST BIOPSY Left    neg   BREAST SURGERY Left 20 yrs ago   CESAREAN SECTION     COLONOSCOPY     COLONOSCOPY WITH PROPOFOL  N/A 10/16/2017   Procedure: COLONOSCOPY WITH PROPOFOL ;  Surgeon: Therisa Bi, MD;  Location: [facility] ENDOSCOPY;  Service: Gastroenterology;  Laterality: N/A;   COLONOSCOPY WITH PROPOFOL  N/A 02/16/2021   Procedure: COLONOSCOPY WITH PROPOFOL ;  Surgeon: Therisa Bi, MD;  Location: [facility] ENDOSCOPY;  Service: Gastroenterology;  Laterality: N/A;   FORAMINOTOMY 1 LEVEL Right 10/10/2022   Procedure: RIGHT L4-5 LAMINOFORAMINOTOMY;  Surgeon: Clois Fret, MD;  Location: [facility] ORS;  Service: Neurosurgery;  Laterality: Right;   HERNIA REPAIR  06/2011, July 2014   Ventral wall repair with Physiomesh   HERNIA REPAIR     2nd.vental wall repair   JOINT REPLACEMENT Right    knee   LUMBAR LAMINECTOMY/ DECOMPRESSION WITH MET-RX N/A 10/12/2022   Procedure: LUMBAR LAMINECTOMY/ DECOMPRESSION WITH MET-RX;  Surgeon: Clois Fret, MD;  Location: [facility] ORS;  Service: Neurosurgery;  Laterality: N/A;  TONSILLECTOMY     TOTAL HIP ARTHROPLASTY Left 07/23/2019   Procedure: TOTAL HIP ARTHROPLASTY;  Surgeon: Edie Norleen PARAS, MD;  Location: [facility] ORS;  Service: Orthopedics;  Laterality: Left;   TOTAL KNEE ARTHROPLASTY Right 05/10/2016   Procedure: TOTAL KNEE ARTHROPLASTY;  Surgeon: Norleen PARAS Edie, MD;  Location: [facility] ORS;  Service: Orthopedics;  Laterality: Right;   TUBAL LIGATION     Patient Active Problem List   Diagnosis Date Noted   Tobacco use disorder 05/14/2024   Renal artery stenosis 04/24/2024   Family history of cerebrovascular accident (CVA) 04/24/2024   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   Chronic pain syndrome 02/15/2024   Multiple thyroid  nodules 01/30/2024   Urge incontinence 12/22/2023   Painful and cold lower extremity 12/22/2023    Hemiplegia and hemiparesis following cerebral infarction affecting right dominant side (HCC) 12/04/2023   Benign hypertension with CKD (chronic kidney disease) stage III (HCC) 12/04/2023   Dysuria 11/03/2023   History of stroke 04/17/2023   Right leg weakness 04/17/2023   Neurogenic bladder 02/13/2023   Abnormality of gait and mobility 02/13/2023   Morbid obesity with body mass index (BMI) of 40.0 to 44.9 in adult (HCC) 01/10/2023   Left thyroid  nodule 01/10/2023   Bipolar 1 disorder, depressed, moderate (HCC) 11/29/2022   Normocytic normochromic anemia 10/13/2022   Mass in epidural space 10/12/2022   Synovial cyst of lumbar facet joint 10/10/2022   Chronic radicular lumbar pain 10/10/2022   S/P laminectomy 10/10/2022   Epigastric pain 07/22/2022   Enlarged thyroid  07/22/2022   Type II diabetes mellitus with nephropathy (HCC) 06/16/2021   H/O thyroid  nodule 05/31/2021   History of hepatitis C- treated 2014- 2015 with [facility] gastroenterology and followed.  05/19/2021   Left carotid bruit 04/28/2021   Bipolar disorder, in full remission, most recent episode depressed 02/05/2021   Anxiety 05/28/2020   Bipolar affective disorder in remission 05/28/2020   Bipolar disorder, in full remission, most recent episode mixed 03/06/2020   Need for immunization against influenza 01/27/2020   Screening for lung cancer 01/27/2020   Morbid obesity (HCC) 01/27/2020   Osteoporosis 11/06/2019   Bipolar 1 disorder, mixed, mild (HCC) 10/29/2019   PTSD (post-traumatic stress disorder) 09/27/2019   Bereavement 09/27/2019   Cocaine use disorder, moderate, in sustained remission (HCC) 09/27/2019   Primary osteoarthritis of left hip 06/14/2019   Allergic rhinitis 08/23/2018   Aortic atherosclerosis 02/20/2018   Centrilobular emphysema (HCC) 02/20/2018   Hyperlipidemia 03/28/2017   Degenerative disc disease at L5-S1 level 09/28/2016   GERD (gastroesophageal reflux disease) 12/17/2015   Chronic back pain  10/09/2015   Post-traumatic osteoarthritis of right knee 09/24/2015   Alcohol use disorder, severe, in sustained remission (HCC) 04/13/2015   OSA on CPAP 04/09/2015   Subclinical hyperthyroidism 06/26/2014   Hypertension 06/26/2014   Bipolar I disorder (HCC) 06/26/2014   Lumbar radiculitis 10/03/2013   Diverticulosis 08/24/2013    PCP: Dr. Luke Shade     REFERRING PROVIDER: Dr. Reeves Daisy    REFERRING DIAG: M54.50,G89.29 (ICD-10-CM) - Chronic bilateral low back pain without sciatica  Rationale for Evaluation and Treatment: Rehabilitation  THERAPY DIAG:  No diagnosis found.  ONSET DATE: 10/2022  SUBJECTIVE:  SUBJECTIVE STATEMENT:   ***  PERTINENT HISTORY:  She has prior history of left sided stroke affecting her right side in February 2024. She also had a L3-L4 lumbar decompression surgery in June 2024 with complications due to a epidural hematoma that resulted in cauda equinda syndrome and her needing to return to ER for evacuation and she was in the hospital for an additional 8 days following surgery.  She is just moving back to [place] from [place], because she has qualified for housing in [place]. She describes being most concerned with her balance especially when walking over uneven surfaces. She mainly uses a a 2WW when walking to places that she has to drive to because it is easier to transport. She has not had a fall in the past 6 mo, but she is scared of falling especially when walking over uneven surfaces. She does report an improvement in LE function with her now being able to raise her toes and that she was not able to do this after surgery.   PAIN:  Are you having pain? Yes: NPRS scale: 5/10       Pain location: Right ischial tuberosity   Pain description: Dull and achy       Aggravating factors: Walking and performing hip abduction  Relieving factors: Using ice and keep still     PRECAUTIONS: Fall; she is afraid of falling    RED FLAGS: None   WEIGHT BEARING RESTRICTIONS: No  FALLS:  Has patient fallen in last 6 months? No  LIVING ENVIRONMENT: Lives with: lives alone Lives in: House/apartment Stairs: Yes: External: 4 steps; on right going up and there is a ramp going to a house and there is a ramped main entrance leading to lobby. Place is called [employer]   Has following equipment at home: Single point cane, Environmental Consultant - 2 wheeled, Environmental Consultant - 4 wheeled, shower chair, Grab bars, and Ramped entry  OCCUPATION: Retired   PLOF: Independent  PATIENT GOALS: Pt wants to be able to walk outside using single point cane and to feel more confidence when walking especially over uneven surfaces. She currently uses rollator to walk dog at home and 2WW when walking anywhere where she has to drive.   NEXT MD VISIT: October 2025     OBJECTIVE:  Note: Objective measures were completed at Evaluation unless otherwise noted.  VITALS BP 118/66 HR  60 Sp02  97%  DIAGNOSTIC FINDINGS:   EXAM: MRI LUMBAR SPINE 02/23/2024  IMPRESSION: 1. No compressive central canal stenosis or acute neural impingement. 2. Moderate left lateral recess stenosis at L2-3 without definite neural compression. 3. Postoperative changes at L3-4 without recurrent canal stenosis; left foraminal narrowing that could affect the L3 nerve. Resolution of the previously seen posterior extradural fluid collection. 4. Scoliotic curvature convex to the right with apex at L2-3, similar to the prior exam. 5. Mild edematous endplate changes at L2-3 and L3-4, which may contribute to regional back pain. 6. No anatomic limitation anticipated for spinal cord stimulator placement at T11-12 or T12-L1.   Electronically signed by: Oneil Officer MD 02/26/2024 09:46 AM EDT RP Workstation:  [id number]  ////////////////////////////////////////////////////////////////////////////////////////////// CLINICAL DATA:  Low back pain. Cauda equina syndrome suspected. Cyst removal from the spine two days ago. Postoperative urinary incontinence and bilateral leg numbness.   EXAM: MRI LUMBAR SPINE WITHOUT CONTRAST   TECHNIQUE: Multiplanar, multisequence MR imaging of the lumbar spine was performed. No intravenous contrast was administered.   COMPARISON:  05/18/2022   FINDINGS: Segmentation: 5 lumbar  type vertebral bodies as numbered previously.   Alignment: Scoliotic curvature convex to the right as seen previously. No significant listhesis.   Vertebrae: Edematous change of the endplates at T10, U88 and T12, likely to relate to regional pain. This is probably degenerative. Lesser vertebral endplate edema noted at L3-4 and L4-5.   Conus medullaris and cauda equina: Conus extends to the L1 level. Conus and cauda equina appear normal.   Paraspinal and other soft tissues: Posterior surgical approach at the L4-5 level from the right-side has an expected appearance.   Disc levels:   At the surgical level and extending within the spinal canal from the upper L3 level to the L 4 5 disc level, there is a fluid collection which causes marked compression of the thecal sac. This is most severe at the level of the L3-4 disc where the fluid collection within the canal measures 1.8 x 1.5 cm in diameter and markedly flattens the thecal sac anteriorly.   Other than the surgical decompression of the synovial cyst on the right and the postoperative intraspinal fluid collection, there is no change since the preoperative exam, as one might expect.   IMPRESSION: 1. At the surgical level and extending from the upper L3 level to the L4-5 disc level, there is a postoperative fluid collection within the spinal canal which causes marked compression of the thecal sac anteriorly. This is most  severe at the level of the L3-4 disc where the fluid collection within the canal measures 1.8 x 1.5 cm in diameter and markedly flattens the thecal sac anteriorly.     Electronically Signed   By: Oneil Officer M.D.   On: 10/12/2022 16:20  PATIENT SURVEYS:  ABC scale: The Activities-Specific Balance Confidence (ABC) Scale 0% 10 20 30  40 50 60 70 80 90 100% No confidence<->completely confident  How confident are you that you will not lose your balance or become unsteady when you . . .   Date tested 01/01/24  Walk around the house 80%  2. Walk up or down stairs 80%  3. Bend over and pick up a slipper from in front of a closet floor 60%  4. Reach for a small can off a shelf at eye level 90%  5. Stand on tip toes and reach for something above your head 0%  6. Stand on a chair and reach for something 40%  7. Sweep the floor 50%  8. Walk outside the house to a car parked in the driveway 90%  9. Get into or out of a car 80%  10. Walk across a parking lot to the mall 80%  11. Walk up or down a ramp 80%  12. Walk in a crowded mall where people rapidly walk past you 60%  13. Are bumped into by people as you walk through the mall 40%  14. Step onto or off of an escalator while you are holding onto the railing 50%  15. Step onto or off an escalator while holding onto parcels such that you cannot hold onto the railing 30%  16. Walk outside on icy sidewalks 0%  Total: #/16  57% (910/1600)       COGNITION: Overall cognitive status: Within functional limits for tasks assessed     SENSATION: Light touch: Impaired electricity down her RLE   MUSCLE LENGTH: Hamstrings: Not tested   Debby test: Not tested    POSTURE: rounded shoulders  PALPATION: Not performed    LUMBAR ROM:   AROM eval  Flexion 80%  Extension 100%  Right lateral flexion 100%  Left lateral flexion 100%  Right rotation 100%  Left rotation 100%   (Blank rows = not tested)   LOWER EXTREMITY MMT:    MMT  Right eval Left eval  Hip flexion 4- 4  Knee flexion 4 4  Knee extension 4 4  Ankle dorsiflexion 4 4   (Blank rows = not tested)    FUNCTIONAL TESTS:  5 times sit to stand: 14 sec , 03/07/24:11 sec  30 seconds chair stand test: 10?30/25:  11 reps, 13 reps    Timed up and go (TUG): 15.38 sec with use of single point cane   6 minute walk test: 553 ft    10 meter walk test: 0.61 m/sec                 DGI:  13/26                       <=19/24  predictive of falls in the elderly     GAIT: Distance walked: 30 ft    Assistive device utilized: Walker - 2 wheeled Level of assistance: Modified independence Comments: Significant use of BUE support with decrease step length bilaterally.    TREATMENT DATE:   ***  Balance and LE strengthening  Glut max strengthening. Gait without AD or at least with [facility] to progress LE strength/balance.    PHYSICAL PERFORMANCE   5xSTS: 11 sec  30 sec chair stands 13 reps   TUG: 12.27 sec with 2WW,  15 sec with single point cane     [facility]  Activities balance confidence scale:   500 ft up and down hill with single point cane.   -intermittent contact guard assistance   -Decreased step length bilaterally with RLE<LLE     PATIENT EDUCATION:  Education details: Form and technique for correct performance of exercise and explanation about cut-offs for functional tests.  Person educated: Patient Education method: Explanation, Demonstration, Verbal cues, and Handouts Education comprehension: verbalized understanding, returned demonstration, and verbal cues required  HOME EXERCISE PROGRAM: Access Code: [email or web address] URL: [email or web address] Date: 02/29/2024 Prepared by: Toribio Servant  Program Notes Toe to heel on right foot with mirror in front of you 3 sets  x 20 reps 5-7 days per week    Exercises - Seated Slump Neural Tensioner  - 1 x daily - 7 x weekly - 1 sets - 10 reps - Seated Hamstring Stretch  - 1 x daily - 7 x weekly -  2-3 reps - 60 sec hold - Seated Hamstring Stretch (Mirrored)  - 1 x daily - 7 x weekly - 2-3 reps - 60 sec hold - Alternating Step Taps with Counter Support (Mirrored)  - 5-7 x weekly - 3 sets - 20 reps - Sit to Stand  - 3-4 x weekly - 3 sets - 10 reps - Standing Ankle Dorsiflexion with Table Support  - 3-4 x weekly - 3 sets - 10 reps - Standing March with Counter Support (Mirrored)  - 3-4 x weekly - 3 sets - 10 reps - Standing Knee Flexion AROM with Chair Support (Mirrored)  - 3-4 x weekly - 3 sets - 10 reps - Standing Hip Abduction with Counter Support (Mirrored)  - 3-4 x weekly - 3 sets - 10 reps - Long Sitting Ankle Plantar Flexion with Resistance (Mirrored)  - 3-4 x weekly - 3 sets - 15 reps  CLINICAL IMPRESSION:    ***  OBJECTIVE IMPAIRMENTS: Abnormal gait, decreased endurance, decreased ROM, decreased strength, impaired flexibility, impaired sensation, obesity, and pain.   ACTIVITY LIMITATIONS: carrying, lifting, bending, standing, squatting, stairs, dressing, hygiene/grooming, and locomotion level  PARTICIPATION LIMITATIONS: meal prep, cleaning, laundry, shopping, community activity, and yard work  PERSONAL FACTORS: Fitness, Past/current experiences, Time since onset of injury/illness/exacerbation, and 3+ comorbidities: h/o stroke with right sided deficits, L3-L5 decompression, HLD, HTN are also affecting patient's functional outcome.   REHAB POTENTIAL: Fair multiple co-morbidities and chronicity of condition  CLINICAL DECISION MAKING: Evolving/moderate complexity  EVALUATION COMPLEXITY: Moderate   GOALS: Goals reviewed with patient? No  SHORT TERM GOALS: Target date: 06/04/2024  Patient will demonstrate undestanding of home exercise plan by performing exercises correctly with evidence of good carry over with min to no verbal or tactile cues .   Baseline: NT  01/16/24: Performing Independently  Goal status: ACHIEVED     2.   Pt will increase by at least 33m (128ft) in  order to demonstrate clinically significant improvement in cardiopulmonary endurance and community ambulation Baseline: 8/28: 553' with RW and unable to complete 6 minutes (stopped at 5 minutes)   Goal status: NOT MET     3.  Patient will show decreased risk of falling as evidence by decrease in self-selected gait speed by >= 0.12 m/sec.  Baseline: 8/28: 0.61 m/s Goal status: NOT MET    4.  Pt will decrease 5TSTS by at least 3 seconds to demonstrate a minimal clinically significant improvement in LE strength.  Baseline: 14 seconds  Goal status: NOT MET     LONG TERM GOALS: Target date: 07/16/2024  Patient will improve activities specific balance scale score >=67% as evidence of an improvement self-perception of function and to decrease risk for falls Mae et al., 2022).  Baseline: 57%  02/14/24: 33% 03/07/24: 44% (700/1600); 04/23/24: 770/1600 = 48%  Goal status: ONGOING    2.  Patient will ambulate >=1000 ft in to show community ambulator status to be able to have aerobic endurance to return to fulfilling walking tasks at his job.  Baseline: 8/28: 553' with RW and unable to complete 6 minutes (stopped at 5 minutes)  02/05/24: 558 ft with 2WW; 04/23/24: 686' using RW Goal status: ONGOING    3.  Patient will demonstrate reduced falls risk as evidenced by Dynamic Gait Index (DGI) >19/24 to decrease risk of falling.  Baseline: 13/24 with use of single point cane in RUE   02/19/24: 15/24-  mild for most, moderate for stairs and obstacle over.; 04/23/24: using SPC, 14/24   Goal status: ONGOING     4. Patient will perform in >=1.0 m./sec as evidence of improved mobility, decreased falls risk, and overall functional independence.  Baseline: 8/28: 0.61 m/s  02/12/24: 0.73 m/sec; 04/23/24: with no AD: 0.55 m/sec, SPC: 0.55 m/s Goal status: ONGOING   PLAN:  PT FREQUENCY: 1-2x/week  PT DURATION: 12 weeks  PLANNED INTERVENTIONS: 97164- PT Re-evaluation, 97750- Physical Performance  Testing, 97110-Therapeutic exercises, 97530- Therapeutic activity, 97112- Neuromuscular re-education, 97535- Self Care, 02859- Manual therapy, [id number]- Gait training, [phone]- Orthotic Initial, [phone]- Orthotic/Prosthetic subsequent, [phone]- Canalith repositioning, [id number]- Aquatic Therapy, [id number]- Electrical stimulation (unattended), [phone]- Electrical stimulation (manual), [id number]- Ultrasound, 79439 (1-2 muscles), 20561 (3+ muscles)- Dry Needling, Patient/Family education, Balance training, Stair training, Taping, Joint mobilization, Joint manipulation, Spinal manipulation, Spinal mobilization, Scar mobilization, Vestibular training, DME instructions, Cryotherapy, Moist heat, and Biofeedback.  PLAN FOR NEXT SESSION: *** Glut max strengthening. Gait without AD or at least  with SPC to progress LE strength/balance.    Fonda Simpers, PT, DPT Physical Therapist - [facility]  05/15/2024, 9:44 AM  "

## 2024-05-20 ENCOUNTER — Encounter: Payer: Self-pay | Admitting: Student in an Organized Health Care Education/Training Program

## 2024-05-20 ENCOUNTER — Ambulatory Visit
Admission: RE | Admit: 2024-05-20 | Discharge: 2024-05-20 | Disposition: A | Discharge status: Home / Self Care | Source: Ambulatory Visit | Attending: Student in an Organized Health Care Education/Training Program | Admitting: Student in an Organized Health Care Education/Training Program

## 2024-05-20 ENCOUNTER — Ambulatory Visit: Attending: Neurosurgery

## 2024-05-20 ENCOUNTER — Ambulatory Visit: Admitting: Student in an Organized Health Care Education/Training Program

## 2024-05-20 VITALS — BP 124/63 | HR 72 | Temp 98.1°F | Resp 16 | Ht 62.0 in | Wt 228.0 lb

## 2024-05-20 DIAGNOSIS — M6281 Muscle weakness (generalized): Secondary | ICD-10-CM | POA: Diagnosis present

## 2024-05-20 DIAGNOSIS — R262 Difficulty in walking, not elsewhere classified: Secondary | ICD-10-CM | POA: Diagnosis present

## 2024-05-20 DIAGNOSIS — M961 Postlaminectomy syndrome, not elsewhere classified: Secondary | ICD-10-CM

## 2024-05-20 DIAGNOSIS — M5459 Other low back pain: Secondary | ICD-10-CM | POA: Insufficient documentation

## 2024-05-20 DIAGNOSIS — G8929 Other chronic pain: Secondary | ICD-10-CM

## 2024-05-20 DIAGNOSIS — G894 Chronic pain syndrome: Secondary | ICD-10-CM

## 2024-05-20 DIAGNOSIS — R2689 Other abnormalities of gait and mobility: Secondary | ICD-10-CM | POA: Insufficient documentation

## 2024-05-20 NOTE — Progress Notes (Signed)
 9175 SCS leads removed per Dr. Marcelino. Leads intact  Site clear. Wound care instructions given with patient understanding.

## 2024-05-20 NOTE — Therapy (Signed)
 " OUTPATIENT PHYSICAL THERAPY THORACOLUMBAR AND BALANCE TREATMENT  Patient Name: Susan [name] MRN: [id number] DOB:July 13, 1960, 64 y.o., female Today's Date: 05/20/2024  END OF SESSION:  PT End of Session - 05/20/24 1258     Visit Number 16    Number of Visits 24    Date for Recertification  07/16/24    Authorization Type [employer] [place] [employer] Dual 2025    Authorization - Number of Visits 24    Progress Note Due on Visit 20    PT Start Time 1258    PT Stop Time 1345    PT Time Calculation (min) 47 min    Equipment Utilized During Treatment Gait belt    Activity Tolerance Patient tolerated treatment well    Behavior During Therapy WFL for tasks assessed/performed         Past Medical History:  Diagnosis Date   ADD (attention deficit disorder)    Alcohol abuse    Anemia    Anxiety    Aortic atherosclerosis 02/20/2018   Chest CT Sept 2019   Arthritis    rheumatoid arthritis   Asthma    Bipolar disorder (HCC)    Centrilobular emphysema (HCC)    Chronic diarrhea 07/22/2022   Cocaine use disorder, moderate, in sustained remission (HCC)    Constipation    Degenerative disc disease at L5-S1 level 09/28/2016   See ortho note May 2018   Depression    bipolar, hx of suicide attempt   Diabetes mellitus, type 2 (HCC)    Diverticulitis 2013   DOE (dyspnea on exertion)    Drug use    Family history of adverse reaction to anesthesia    mom-delayed emergence   GERD (gastroesophageal reflux disease)    H/O suicide attempt    slit wrists   Hepatitis C 06/26/2014   treated with Harvoni   Hip pain    History of echocardiogram    a. 04/2019 Echo: EF >65%, nl RV fxn.   History of MRSA infection 2013   HLD (hyperlipidemia)    Hypertension    a. 06/2021 Renal Duplex: ? bilat RAS; b. 06/2021 CTA Abd: No signif RAS.   Hyponatremia    Hypothyroidism    Incisional hernia 11/08/2012   Knee pain    Left carotid bruit    Morbid obesity (HCC)    Multinodular thyroid     OSA (obstructive  sleep apnea)    OSA on CPAP    Osteoporosis    Palpitations    Post-traumatic osteoarthritis of right knee 09/24/2015   Recurrent ventral hernia 11/08/2012   Skin lesion 06/28/2022   Status post total hip replacement, left 07/23/2019   Status post total right knee replacement using cement 05/10/2016   Stroke (HCC) 07/05/2022   right arm,torso and right leg numbness   Tobacco use disorder    Tobacco use disorder, continuous 06/26/2014   Vitamin B12 deficiency    Vitamin D  deficiency 04/09/2015   Past Surgical History:  Procedure Laterality Date   BILATERAL SALPINGOOPHORECTOMY     due to abnormal mass   BREAST BIOPSY Left    neg   BREAST SURGERY Left 20 yrs ago   CESAREAN SECTION     COLONOSCOPY     COLONOSCOPY WITH PROPOFOL  N/A 10/16/2017   Procedure: COLONOSCOPY WITH PROPOFOL ;  Surgeon: Therisa Bi, MD;  Location: [facility] ENDOSCOPY;  Service: Gastroenterology;  Laterality: N/A;   COLONOSCOPY WITH PROPOFOL  N/A 02/16/2021   Procedure: COLONOSCOPY WITH PROPOFOL ;  Surgeon: Therisa Bi, MD;  Location:  [facility] ENDOSCOPY;  Service: Gastroenterology;  Laterality: N/A;   FORAMINOTOMY 1 LEVEL Right 10/10/2022   Procedure: RIGHT L4-5 LAMINOFORAMINOTOMY;  Surgeon: Clois Fret, MD;  Location: [facility] ORS;  Service: Neurosurgery;  Laterality: Right;   HERNIA REPAIR  06/2011, July 2014   Ventral wall repair with Physiomesh   HERNIA REPAIR     2nd.vental wall repair   JOINT REPLACEMENT Right    knee   LUMBAR LAMINECTOMY/ DECOMPRESSION WITH MET-RX N/A 10/12/2022   Procedure: LUMBAR LAMINECTOMY/ DECOMPRESSION WITH MET-RX;  Surgeon: Clois Fret, MD;  Location: [facility] ORS;  Service: Neurosurgery;  Laterality: N/A;   TONSILLECTOMY     TOTAL HIP ARTHROPLASTY Left 07/23/2019   Procedure: TOTAL HIP ARTHROPLASTY;  Surgeon: Edie Norleen PARAS, MD;  Location: [facility] ORS;  Service: Orthopedics;  Laterality: Left;   TOTAL KNEE ARTHROPLASTY Right 05/10/2016   Procedure: TOTAL KNEE ARTHROPLASTY;  Surgeon: Norleen PARAS Edie, MD;  Location: [facility] ORS;  Service: Orthopedics;  Laterality: Right;   TUBAL LIGATION     Patient Active Problem List   Diagnosis Date Noted   Tobacco use disorder 05/14/2024   Renal artery stenosis 04/24/2024   Family history of cerebrovascular accident (CVA) 04/24/2024   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   Chronic pain syndrome 02/15/2024   Multiple thyroid  nodules 01/30/2024   Urge incontinence 12/22/2023   Painful and cold lower extremity 12/22/2023   Hemiplegia and hemiparesis following cerebral infarction affecting right dominant side (HCC) 12/04/2023   Benign hypertension with CKD (chronic kidney disease) stage III (HCC) 12/04/2023   Dysuria 11/03/2023   History of stroke 04/17/2023   Right leg weakness 04/17/2023   Neurogenic bladder 02/13/2023   Abnormality of gait and mobility 02/13/2023   Morbid obesity with body mass index (BMI) of 40.0 to 44.9 in adult (HCC) 01/10/2023   Left thyroid  nodule 01/10/2023   Bipolar 1 disorder, depressed, moderate (HCC) 11/29/2022   Normocytic normochromic anemia 10/13/2022   Mass in epidural space 10/12/2022   Synovial cyst of lumbar facet joint 10/10/2022   Chronic radicular lumbar pain 10/10/2022   S/P laminectomy 10/10/2022   Epigastric pain 07/22/2022   Enlarged thyroid  07/22/2022   Type II diabetes mellitus with nephropathy (HCC) 06/16/2021   H/O thyroid  nodule 05/31/2021   History of hepatitis C- treated 2014- 2015 with [facility]enterology and followed.  05/19/2021   Left carotid bruit 04/28/2021   Bipolar disorder, in full remission, most recent episode depressed 02/05/2021   Anxiety 05/28/2020   Bipolar affective disorder in remission 05/28/2020   Bipolar disorder, in full remission, most recent episode mixed 03/06/2020   Need for immunization against influenza 01/27/2020   Screening for lung cancer 01/27/2020   Morbid obesity (HCC) 01/27/2020   Osteoporosis 11/06/2019   Bipolar 1 disorder, mixed, mild (HCC)  10/29/2019   PTSD (post-traumatic stress disorder) 09/27/2019   Bereavement 09/27/2019   Cocaine use disorder, moderate, in sustained remission (HCC) 09/27/2019   Primary osteoarthritis of left hip 06/14/2019   Allergic rhinitis 08/23/2018   Aortic atherosclerosis 02/20/2018   Centrilobular emphysema (HCC) 02/20/2018   Hyperlipidemia 03/28/2017   Degenerative disc disease at L5-S1 level 09/28/2016   GERD (gastroesophageal reflux disease) 12/17/2015   Chronic back pain 10/09/2015   Post-traumatic osteoarthritis of right knee 09/24/2015   Alcohol use disorder, severe, in sustained remission (HCC) 04/13/2015   OSA on CPAP 04/09/2015   Subclinical hyperthyroidism 06/26/2014   Hypertension 06/26/2014   Bipolar I disorder (HCC) 06/26/2014   Lumbar radiculitis 10/03/2013   Diverticulosis 08/24/2013  PCP: Dr. Luke Shade     REFERRING PROVIDER: Dr. Reeves Daisy    REFERRING DIAG: [phone] (ICD-10-CM) - Chronic bilateral low back pain without sciatica  Rationale for Evaluation and Treatment: Rehabilitation  THERAPY DIAG:  Other low back pain  Imbalance  Difficulty in walking, not elsewhere classified  Muscle weakness (generalized)  ONSET DATE: 10/2022  SUBJECTIVE:                                                                                                                                                                                           SUBJECTIVE STATEMENT:    Pt reports that the spinal stimulator went well until Saturday night when she went to go use the bathroom and her pain went from 2/10 to 10/10.  Pt notes that they removed it today, and her back is killing her.  Pt notes she took pain meds and it still hasn't helped.  PERTINENT HISTORY:  She has prior history of left sided stroke affecting her right side in February 2024. She also had a L3-L4 lumbar decompression surgery in June 2024 with complications due to a epidural hematoma that resulted in  cauda equinda syndrome and her needing to return to ER for evacuation and she was in the hospital for an additional 8 days following surgery.  She is just moving back to [place] from [place], because she has qualified for housing in [place]. She describes being most concerned with her balance especially when walking over uneven surfaces. She mainly uses a a 2WW when walking to places that she has to drive to because it is easier to transport. She has not had a fall in the past 6 mo, but she is scared of falling especially when walking over uneven surfaces. She does report an improvement in LE function with her now being able to raise her toes and that she was not able to do this after surgery.   PAIN:  Are you having pain? Yes: NPRS scale: 5/10       Pain location: Right ischial tuberosity   Pain description: Dull and achy      Aggravating factors: Walking and performing hip abduction  Relieving factors: Using ice and keep still     PRECAUTIONS: Fall; she is afraid of falling    RED FLAGS: None   WEIGHT BEARING RESTRICTIONS: No  FALLS:  Has patient fallen in last 6 months? No  LIVING ENVIRONMENT: Lives with: lives alone Lives in: House/apartment Stairs: Yes: External: 4 steps; on right going up and there is a ramp going to a house and there is a ramped main entrance leading to lobby. Place  is called [employer]   Has following equipment at home: Single point cane, Environmental Consultant - 2 wheeled, Environmental Consultant - 4 wheeled, shower chair, Grab bars, and Ramped entry  OCCUPATION: Retired   PLOF: Independent  PATIENT GOALS: Pt wants to be able to walk outside using single point cane and to feel more confidence when walking especially over uneven surfaces. She currently uses rollator to walk dog at home and 2WW when walking anywhere where she has to drive.   NEXT MD VISIT: October 2025     OBJECTIVE:  Note: Objective measures were completed at Evaluation unless otherwise noted.  VITALS BP  118/66 HR  60 Sp02  97%  DIAGNOSTIC FINDINGS:   EXAM: MRI LUMBAR SPINE 02/23/2024  IMPRESSION: 1. No compressive central canal stenosis or acute neural impingement. 2. Moderate left lateral recess stenosis at L2-3 without definite neural compression. 3. Postoperative changes at L3-4 without recurrent canal stenosis; left foraminal narrowing that could affect the L3 nerve. Resolution of the previously seen posterior extradural fluid collection. 4. Scoliotic curvature convex to the right with apex at L2-3, similar to the prior exam. 5. Mild edematous endplate changes at L2-3 and L3-4, which may contribute to regional back pain. 6. No anatomic limitation anticipated for spinal cord stimulator placement at T11-12 or T12-L1.   Electronically signed by: Oneil Officer MD 02/26/2024 09:46 AM EDT RP Workstation: [id number]  ////////////////////////////////////////////////////////////////////////////////////////////// CLINICAL DATA:  Low back pain. Cauda equina syndrome suspected. Cyst removal from the spine two days ago. Postoperative urinary incontinence and bilateral leg numbness.   EXAM: MRI LUMBAR SPINE WITHOUT CONTRAST   TECHNIQUE: Multiplanar, multisequence MR imaging of the lumbar spine was performed. No intravenous contrast was administered.   COMPARISON:  05/18/2022   FINDINGS: Segmentation: 5 lumbar type vertebral bodies as numbered previously.   Alignment: Scoliotic curvature convex to the right as seen previously. No significant listhesis.   Vertebrae: Edematous change of the endplates at T10, U88 and T12, likely to relate to regional pain. This is probably degenerative. Lesser vertebral endplate edema noted at L3-4 and L4-5.   Conus medullaris and cauda equina: Conus extends to the L1 level. Conus and cauda equina appear normal.   Paraspinal and other soft tissues: Posterior surgical approach at the L4-5 level from the right-side has an expected appearance.    Disc levels:   At the surgical level and extending within the spinal canal from the upper L3 level to the L 4 5 disc level, there is a fluid collection which causes marked compression of the thecal sac. This is most severe at the level of the L3-4 disc where the fluid collection within the canal measures 1.8 x 1.5 cm in diameter and markedly flattens the thecal sac anteriorly.   Other than the surgical decompression of the synovial cyst on the right and the postoperative intraspinal fluid collection, there is no change since the preoperative exam, as one might expect.   IMPRESSION: 1. At the surgical level and extending from the upper L3 level to the L4-5 disc level, there is a postoperative fluid collection within the spinal canal which causes marked compression of the thecal sac anteriorly. This is most severe at the level of the L3-4 disc where the fluid collection within the canal measures 1.8 x 1.5 cm in diameter and markedly flattens the thecal sac anteriorly.     Electronically Signed   By: Oneil Officer M.D.   On: 10/12/2022 16:20  PATIENT SURVEYS:  ABC scale: The Activities-Specific Balance Confidence (ABC)  Scale 0% 10 20 30  40 50 60 70 80 90 100% No confidence<->completely confident  How confident are you that you will not lose your balance or become unsteady when you . . .   Date tested 01/01/24  Walk around the house 80%  2. Walk up or down stairs 80%  3. Bend over and pick up a slipper from in front of a closet floor 60%  4. Reach for a small can off a shelf at eye level 90%  5. Stand on tip toes and reach for something above your head 0%  6. Stand on a chair and reach for something 40%  7. Sweep the floor 50%  8. Walk outside the house to a car parked in the driveway 90%  9. Get into or out of a car 80%  10. Walk across a parking lot to the mall 80%  11. Walk up or down a ramp 80%  12. Walk in a crowded mall where people rapidly walk past you 60%  13. Are  bumped into by people as you walk through the mall 40%  14. Step onto or off of an escalator while you are holding onto the railing 50%  15. Step onto or off an escalator while holding onto parcels such that you cannot hold onto the railing 30%  16. Walk outside on icy sidewalks 0%  Total: #/16  57% (910/1600)       COGNITION: Overall cognitive status: Within functional limits for tasks assessed     SENSATION: Light touch: Impaired electricity down her RLE   MUSCLE LENGTH: Hamstrings: Not tested   Debby test: Not tested    POSTURE: rounded shoulders  PALPATION: Not performed    LUMBAR ROM:   AROM eval  Flexion 80%  Extension 100%  Right lateral flexion 100%  Left lateral flexion 100%  Right rotation 100%  Left rotation 100%   (Blank rows = not tested)   LOWER EXTREMITY MMT:    MMT Right eval Left eval  Hip flexion 4- 4  Knee flexion 4 4  Knee extension 4 4  Ankle dorsiflexion 4 4   (Blank rows = not tested)    FUNCTIONAL TESTS:  5 times sit to stand: 14 sec , 03/07/24:11 sec  30 seconds chair stand test: 10?30/25:  11 reps, 13 reps    Timed up and go (TUG): 15.38 sec with use of single point cane   6 minute walk test: 553 ft    10 meter walk test: 0.61 m/sec                 DGI:  13/26                       <=19/24  predictive of falls in the elderly     GAIT: Distance walked: 30 ft    Assistive device utilized: Walker - 2 wheeled Level of assistance: Modified independence Comments: Significant use of BUE support with decrease step length bilaterally.    TREATMENT DATE:   TherAct:  Ambulation around the gym, x2 laps, utilizing cane and CGA with gait belt donned  STS 2x10   TherEx:  Hooklying posterior pelvic tilts, 3 sec holds, 2x10 Hooklying LTR's, 2x10 each direction Hooklying glute bridges, 2x10  Seated long sitting hamstring stretch, 3 sec holds, 2x10      PATIENT EDUCATION:  Education details: Form and technique for correct  performance of exercise and explanation about cut-offs for functional tests.  Person educated: Patient Education method: Explanation, Demonstration, Verbal cues, and Handouts Education comprehension: verbalized understanding, returned demonstration, and verbal cues required  HOME EXERCISE PROGRAM: Access Code: [email or web address] URL: [email or web address] Date: 02/29/2024 Prepared by: Toribio Servant  Program Notes Toe to heel on right foot with mirror in front of you 3 sets  x 20 reps 5-7 days per week    Exercises - Seated Slump Neural Tensioner  - 1 x daily - 7 x weekly - 1 sets - 10 reps - Seated Hamstring Stretch  - 1 x daily - 7 x weekly - 2-3 reps - 60 sec hold - Seated Hamstring Stretch (Mirrored)  - 1 x daily - 7 x weekly - 2-3 reps - 60 sec hold - Alternating Step Taps with Counter Support (Mirrored)  - 5-7 x weekly - 3 sets - 20 reps - Sit to Stand  - 3-4 x weekly - 3 sets - 10 reps - Standing Ankle Dorsiflexion with Table Support  - 3-4 x weekly - 3 sets - 10 reps - Standing March with Counter Support (Mirrored)  - 3-4 x weekly - 3 sets - 10 reps - Standing Knee Flexion AROM with Chair Support (Mirrored)  - 3-4 x weekly - 3 sets - 10 reps - Standing Hip Abduction with Counter Support (Mirrored)  - 3-4 x weekly - 3 sets - 10 reps - Long Sitting Ankle Plantar Flexion with Resistance (Mirrored)  - 3-4 x weekly - 3 sets - 15 reps  CLINICAL IMPRESSION:    Pt responded well with exercises given, however needed to have multiple rest breaks during the session in order continue due to the back pain that she was experiencing.  Pt still was able to tolerate the exercises within pain tolerable ranges.  Pt encouraged to continue to perform exercises at home to improve overall tolerance to upright exercises and balance and stability as well.   Pt will continue to benefit from skilled therapy to address remaining deficits in order to improve overall QoL and return to PLOF.      OBJECTIVE IMPAIRMENTS: Abnormal gait, decreased endurance, decreased ROM, decreased strength, impaired flexibility, impaired sensation, obesity, and pain.   ACTIVITY LIMITATIONS: carrying, lifting, bending, standing, squatting, stairs, dressing, hygiene/grooming, and locomotion level  PARTICIPATION LIMITATIONS: meal prep, cleaning, laundry, shopping, community activity, and yard work  PERSONAL FACTORS: Fitness, Past/current experiences, Time since onset of injury/illness/exacerbation, and 3+ comorbidities: h/o stroke with right sided deficits, L3-L5 decompression, HLD, HTN are also affecting patient's functional outcome.   REHAB POTENTIAL: Fair multiple co-morbidities and chronicity of condition  CLINICAL DECISION MAKING: Evolving/moderate complexity  EVALUATION COMPLEXITY: Moderate   GOALS: Goals reviewed with patient? No  SHORT TERM GOALS: Target date: 06/04/2024  Patient will demonstrate undestanding of home exercise plan by performing exercises correctly with evidence of good carry over with min to no verbal or tactile cues .   Baseline: NT  01/16/24: Performing Independently  Goal status: ACHIEVED     2.   Pt will increase by at least 30m (126ft) in order to demonstrate clinically significant improvement in cardiopulmonary endurance and community ambulation Baseline: 8/28: 553' with RW and unable to complete 6 minutes (stopped at 5 minutes)   Goal status: NOT MET     3.  Patient will show decreased risk of falling as evidence by decrease in self-selected gait speed by >= 0.12 m/sec.  Baseline: 8/28: 0.61 m/s Goal status: NOT MET    4.  Pt will decrease 5TSTS by at  least 3 seconds to demonstrate a minimal clinically significant improvement in LE strength.  Baseline: 14 seconds  Goal status: NOT MET     LONG TERM GOALS: Target date: 07/16/2024  Patient will improve activities specific balance scale score >=67% as evidence of an improvement self-perception of function  and to decrease risk for falls Mae et al., 2022).  Baseline: 57%  02/14/24: 33% 03/07/24: 44% (700/1600); 04/23/24: 770/1600 = 48%  Goal status: ONGOING    2.  Patient will ambulate >=1000 ft in to show community ambulator status to be able to have aerobic endurance to return to fulfilling walking tasks at his job.  Baseline: 8/28: 553' with RW and unable to complete 6 minutes (stopped at 5 minutes)  02/05/24: 558 ft with 2WW; 04/23/24: 686' using RW Goal status: ONGOING    3.  Patient will demonstrate reduced falls risk as evidenced by Dynamic Gait Index (DGI) >19/24 to decrease risk of falling.  Baseline: 13/24 with use of single point cane in RUE   02/19/24: 15/24-  mild for most, moderate for stairs and obstacle over.; 04/23/24: using SPC, 14/24   Goal status: ONGOING     4. Patient will perform in >=1.0 m./sec as evidence of improved mobility, decreased falls risk, and overall functional independence.  Baseline: 8/28: 0.61 m/s  02/12/24: 0.73 m/sec; 04/23/24: with no AD: 0.55 m/sec, SPC: 0.55 m/s Goal status: ONGOING   PLAN:  PT FREQUENCY: 1-2x/week  PT DURATION: 12 weeks  PLANNED INTERVENTIONS: [id number]- PT Re-evaluation, 97750- Physical Performance Testing, 97110-Therapeutic exercises, 97530- Therapeutic activity, 97112- Neuromuscular re-education, 97535- Self Care, 02859- Manual therapy, [id number]- Gait training, [phone]- Orthotic Initial, [phone]- Orthotic/Prosthetic subsequent, [phone]- Canalith repositioning, [id number]- Aquatic Therapy, [id number]- Electrical stimulation (unattended), [phone]- Electrical stimulation (manual), [id number]- Ultrasound, 79439 (1-2 muscles), 20561 (3+ muscles)- Dry Needling, Patient/Family education, Balance training, Stair training, Taping, Joint mobilization, Joint manipulation, Spinal manipulation, Spinal mobilization, Scar mobilization, Vestibular training, DME instructions, Cryotherapy, Moist heat, and Biofeedback.  PLAN FOR NEXT SESSION:  Glut max strengthening.  Gait without AD or at least with [facility] to progress LE strength/balance.    Fonda Simpers, PT, DPT Physical Therapist - [facility]  05/20/2024, 2:23 PM  "

## 2024-05-20 NOTE — Progress Notes (Signed)
 PROVIDER NOTE: Interpretation of information contained herein should be left to medically-trained personnel. Specific patient instructions are provided elsewhere under Patient Instructions section of medical record. This document was created in part using AI and STT-dictation technology, any transcriptional errors that may result from this process are unintentional.  Patient: Susan [name]  Service: E/M Post-op Encounter  PCP: Bair, Kalpana, MD  DOB: 12/18/1960  DOS: 05/20/2024  Provider: Wallie Sherry, MD  MRN: [id number]  Delivery: Face-to-face  Specialty: Interventional Pain Management  Type: Established Patient  Setting: Ambulatory outpatient facility  Specialty designation: 09  Referring Prov.: Abbey Bruckner, MD  Location: Outpatient office facility  SCS TRIAL POST-OP EVALUATION     Primary Reason(s) for Visit: Encounter for removal of temporary spinal cord stimulator lead(s) and evaluation of trial implant. CC: Back Pain (Lumbar bilateral )  HPI  [name] is a 64 y.o. year old, female patient, who comes today for a post-procedure evaluation. She has Subclinical hyperthyroidism; Hypertension; Bipolar I disorder (HCC); OSA on CPAP; Alcohol use disorder, severe, in sustained remission (HCC); Post-traumatic osteoarthritis of right knee; Chronic back pain; GERD (gastroesophageal reflux disease); Degenerative disc disease at L5-S1 level; Hyperlipidemia; Diverticulosis; Lumbar radiculitis; Aortic atherosclerosis; Centrilobular emphysema (HCC); Allergic rhinitis; Primary osteoarthritis of left hip; PTSD (post-traumatic stress disorder); Bereavement; Cocaine use disorder, moderate, in sustained remission (HCC); Bipolar 1 disorder, mixed, mild (HCC); Osteoporosis; Need for immunization against influenza; Screening for lung cancer; Morbid obesity (HCC); Bipolar disorder, in full remission, most recent episode mixed; Anxiety; Bipolar affective disorder in remission; Bipolar disorder, in full remission,  most recent episode depressed; Left carotid bruit; History of hepatitis C- treated 2014- 2015 with [facility] gastroenterology and followed. ; H/O thyroid  nodule; Type II diabetes mellitus with nephropathy (HCC); Epigastric pain; Enlarged thyroid ; Synovial cyst of lumbar facet joint; Chronic radicular lumbar pain; S/P laminectomy; Mass in epidural space; Normocytic normochromic anemia; Bipolar 1 disorder, depressed, moderate (HCC); Neurogenic bladder; Abnormality of gait and mobility; History of stroke; Right leg weakness; Dysuria; Morbid obesity with body mass index (BMI) of 40.0 to 44.9 in adult [facility]); Left thyroid  nodule; Hemiplegia and hemiparesis following cerebral infarction affecting right dominant side (HCC); Benign hypertension with CKD (chronic kidney disease) stage III (HCC); Urge incontinence; Painful and cold lower extremity; Failed back surgical syndrome; Chronic pain syndrome; Multiple thyroid  nodules; Renal artery stenosis; Family history of cerebrovascular accident (CVA); and Tobacco use disorder on their problem list. Her primarily concern today is the Back Pain (Lumbar bilateral )  Pain Assessment: Location: Left, Right, Lower Back Radiating: denies Onset: More than a month ago Duration: Chronic pain Quality: Aching, Dull, Discomfort, Nagging Severity: 5 /10 (subjective, self-reported pain score)  Effect on ADL: after she got over discomfort from placement she felt she was able to do a little more activity and felt better overall Timing:   Modifying factors: SCS trial, rest, vicodin BP: 124/63  HR: 72  [name] comes in today, after a SCS (Spinal Cord Stimulator) Trial Implant on 05/13/2024, to have her percutaneous, temporary neurostimulator lead(s) removed and to evaluate the trial experience to determine if a permanent implant may be effective in controlling some or all of her chronic pain symptoms.  Further details on both, my assessment(s), as well as the proposed treatment plan,  please see below.  Patient had a positive spinal cord stimulator trial and endorses greater than 50% pain relief and also improvement in her ability to walk and stand.  She is interested in moving forward with permanent implant.  Referral to Dr. Katrina sent over.  Post-operative Assessment Intra-procedural problems/complications: None observed.         Reported side-effects: None.        Post-surgical adverse reactions or complications: None reported         Laboratory Chemistry Profile   Renal Lab Results  Component Value Date   BUN 10 12/04/2023   CREATININE 1.35 (H) 12/04/2023   LABCREA 213 10/16/2019   BCR 7 12/04/2023   GFR 37.81 (L) 03/28/2023   GFRAA 64 05/28/2020   GFRNONAA 22 (L) 11/17/2023   SPECGRAV <1.005 (L) 04/22/2024   PHUR 6.0 04/22/2024   PROTEINUR Negative 04/22/2024     Electrolytes Lab Results  Component Value Date   NA 137 12/04/2023   K 4.3 12/04/2023   CL 104 12/04/2023   CALCIUM  8.8 12/04/2023   MG 2.4 07/06/2022   PHOS 5.0 (H) 07/06/2022     Hepatic Lab Results  Component Value Date   AST 14 12/04/2023   ALT 10 12/04/2023   ALBUMIN 3.7 11/17/2023   ALKPHOS 82 11/17/2023   LIPASE 6.0 (L) 07/22/2022     ID Lab Results  Component Value Date   HIV Non Reactive 07/05/2022   SARSCOV2NAA NEGATIVE 07/07/2021   STAPHAUREUS NEGATIVE 10/05/2022   MRSAPCR NEGATIVE 10/05/2022     Bone Lab Results  Component Value Date   VD25OH 41.34 11/04/2022     Endocrine Lab Results  Component Value Date   GLUCOSE 81 12/04/2023   GLUCOSEU Negative 04/22/2024   HGBA1C 5.4 04/24/2024   TSH 1.06 03/28/2023   FREET4 0.88 07/26/2018     Neuropathy Lab Results  Component Value Date   VITAMINB12 319 04/24/2024   FOLATE 6.5 07/26/2018   HGBA1C 5.4 04/24/2024   HIV Non Reactive 07/05/2022     CNS No results found for: COLORCSF, APPEARCSF, RBCCOUNTCSF, WBCCSF, POLYSCSF, LYMPHSCSF, EOSCSF, PROTEINCSF, GLUCCSF, JCVIRUS, CSFOLI,  IGGCSF, LABACHR, ACETBL   Inflammation (CRP: Acute  ESR: Chronic) No results found for: CRP, ESRSEDRATE, LATICACIDVEN   Rheumatology No results found for: RF, ANA, LABURIC, URICUR, LYMEIGGIGMAB, LYMEABIGMQN, HLAB27   Coagulation Lab Results  Component Value Date   INR 1.1 08/02/2022   LABPROT 14.3 08/02/2022   APTT 31 08/02/2022   PLT 164.0 04/24/2024   DDIMER 0.50 10/13/2022     Cardiovascular Lab Results  Component Value Date   BNP 95.9 10/13/2022   CKTOTAL 86 04/28/2021   CKMB 1.5 04/28/2021   TROPONINI <0.03 12/16/2016   HGB 11.5 (L) 04/24/2024   HCT 33.9 (L) 04/24/2024     Screening Lab Results  Component Value Date   SARSCOV2NAA NEGATIVE 07/07/2021   STAPHAUREUS NEGATIVE 10/05/2022   MRSAPCR NEGATIVE 10/05/2022   HIV Non Reactive 07/05/2022     Cancer No results found for: CEA, CA125, LABCA2   Allergens No results found for: ALMOND, APPLE, ASPARAGUS, AVOCADO, BANANA, BARLEY, BASIL, BAYLEAF, GREENBEAN, LIMABEAN, WHITEBEAN, BEEFIGE, REDBEET, BLUEBERRY, BROCCOLI, CABBAGE, MELON, CARROT, CASEIN, CASHEWNUT, CAULIFLOWER, CELERY     Note: Lab results reviewed.  Recent Imaging Results  Results for orders placed during the hospital encounter of 10/12/22  DG C-Arm 1-60 Min-No Report  Narrative Fluoroscopy was utilized by the requesting physician.  No radiographic interpretation.  Interpretation Report: Fluoroscopy was used during the procedure to assist with needle guidance. The images were interpreted intraoperatively by the requesting physician.        Meds  Current Medications[1]  ROS  Constitutional: Denies any fever or chills Gastrointestinal: No reported hemesis, hematochezia,  vomiting, or acute GI distress Musculoskeletal: Denies any acute onset joint swelling, redness, loss of ROM, or weakness Neurological: No reported episodes of acute onset apraxia, aphasia, dysarthria,  agnosia, amnesia, paralysis, loss of coordination, or loss of consciousness  Allergies  Ms. Lai is allergic to amlodipine , wellbutrin  [bupropion ], furosemide, and hydrochlorothiazide .  PFSH  Drug: Ms. Grand  reports no history of drug use. Alcohol:  reports no history of alcohol use. Tobacco:  reports that she has been smoking cigarettes. She started smoking about 49 years ago. She has a 24.5 pack-year smoking history. She has never used smokeless tobacco. Medical:  has a past medical history of ADD (attention deficit disorder), Alcohol abuse, Anemia, Anxiety, Aortic atherosclerosis (02/20/2018), Arthritis, Asthma, Bipolar disorder (HCC), Centrilobular emphysema (HCC), Chronic diarrhea (07/22/2022), Cocaine use disorder, moderate, in sustained remission (HCC), Constipation, Degenerative disc disease at L5-S1 level (09/28/2016), Depression, Diabetes mellitus, type 2 (HCC), Diverticulitis (2013), DOE (dyspnea on exertion), Drug use, Family history of adverse reaction to anesthesia, GERD (gastroesophageal reflux disease), H/O suicide attempt, Hepatitis C (06/26/2014), Hip pain, History of echocardiogram, History of MRSA infection (2013), HLD (hyperlipidemia), Hypertension, Hyponatremia, Hypothyroidism, Incisional hernia (11/08/2012), Knee pain, Left carotid bruit, Morbid obesity (HCC), Multinodular thyroid , OSA (obstructive sleep apnea), OSA on CPAP, Osteoporosis, Palpitations, Post-traumatic osteoarthritis of right knee (09/24/2015), Recurrent ventral hernia (11/08/2012), Skin lesion (06/28/2022), Status post total hip replacement, 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 Surgical: Ms. Brocksmith  has a past surgical history that includes Breast surgery (Left, 20 yrs ago); Cesarean section; Bilateral  salpingoophorectomy; Tonsillectomy; Tubal ligation; Total knee arthroplasty (Right, 05/10/2016); Breast biopsy (Left); Colonoscopy; Colonoscopy with propofol  (N/A, 10/16/2017); Joint replacement (Right); Hernia repair (06/2011, July 2014); Hernia repair; Total hip arthroplasty (Left, 07/23/2019); Colonoscopy with propofol  (N/A, 02/16/2021); Foraminotomy 1 level (Right, 10/10/2022); and Lumbar laminectomy/ decompression with met-rx (N/A, 10/12/2022). Family: family history includes Alcohol abuse in her father, mother, sister, sister, son, and son; Arthritis in her brother and mother; Asthma in her mother; Bipolar disorder in her mother; COPD in her mother; Cancer in her brother; Congestive Heart Failure in her mother; Depression in her sister; Drug abuse in her daughter, sister, sister, son, and son; Eating disorder in her mother; Fibromyalgia in her sister; Heart attack in her father; Heart disease in her mother; Mental illness in her brother, mother, sister, sister, and sister; Obesity in her sister; Pneumonia in her sister; Thyroid  disease in her mother.  Postop Exam  General appearance: Afebrile. Well nourished, well developed, and well hydrated. In no apparent acute distress. Vitals:   05/20/24 0808  BP: 124/63  Pulse: 72  Resp: 16  Temp: 98.1 F (36.7 C)  TempSrc: Temporal  SpO2: 100%  Weight: 228 lb (103.4 kg)  Height: 5' 2 (1.575 m)   BMI Assessment: Estimated body mass index is 41.7 kg/m as calculated from the following:   Height as of this encounter: 5' 2 (1.575 m).   Weight as of this encounter: 228 lb (103.4 kg).  Surgical site: Wound is healing well. No redness, tenderness, discharge, abnormal odors, or any other evidence of infection or complications.  Assessment  Primary Diagnosis & Pertinent Problem List: The primary encounter diagnosis was Lumbar post-laminectomy syndrome. Diagnoses of Failed back surgical syndrome, Chronic radicular lumbar pain, and Chronic pain syndrome were  also pertinent to this visit.  Diagnosis Status  1. Lumbar post-laminectomy syndrome  2. Failed back surgical syndrome   3. Chronic radicular lumbar pain   4. Chronic pain syndrome    Controlled Controlled Controlled   Successful spinal cord stimulator trial.  Referral to Dr. Katrina for permanent implant.  Can follow-up with me as needed.  Plan of Care Orders:  Orders Placed This Encounter  Procedures   DG PAIN CLINIC C-ARM 1-60 MIN NO REPORT    Intraoperative interpretation by procedural physician at [facility].    Standing Status:   Standing    Number of Occurrences:   1    Reason for exam::   Assistance in needle guidance and placement for procedures requiring needle placement in or near specific anatomical locations not easily accessible without such assistance.   Ambulatory referral to Neurosurgery    Referral Priority:   Routine    Referral Type:   Surgical    Referral Reason:   Specialty Services Required    Referred to Provider:   Clois Fret, MD    Requested Specialty:   Neurosurgery    Number of Visits Requested:   1    Medications administered: Kathy L. Sholtz had no medications administered during this visit.  See the medical record for exact dosing, route, and time of administration.   Follow-up plan:   No follow-ups on file.     Recent Visits Date Type Provider Dept  05/13/24 Procedure visit Marcelino Nurse, MD [facility]  Showing recent visits within past 90 days and meeting all other requirements Today's Visits Date Type Provider Dept  05/20/24 Procedure visit Marcelino Nurse, MD [facility]  Showing today's visits and meeting all other requirements Future Appointments No visits were found meeting these conditions. Showing future appointments within next 90 days and meeting all other requirements  Disposition: Discharge home  Discharge (Date  Time): 05/20/2024; 0835 hrs.   Primary Care Physician: Bair,  Kalpana, MD Location: [facility] Note by: Nurse Marcelino, MD (TTS technology used. I apologize for any typographical errors that were not detected and corrected.) Date: 05/20/2024; Time: 8:27 AM     [1]  Current Outpatient Medications:    acetaminophen  (TYLENOL ) 500 MG tablet, Take 1,000 mg by mouth 2 (two) times daily., Disp: , Rfl:    aspirin  EC 81 MG tablet, Take 1 tablet (81 mg total) by mouth daily. *Swallow whole*, Disp: 90 tablet, Rfl: 3   carvedilol  (COREG ) 12.5 MG tablet, Take 1 tablet (12.5 mg total) by mouth 2 (two) times daily with a meal., Disp: 180 tablet, Rfl: 3   cephALEXin  (KEFLEX ) 500 MG capsule, Take 1 capsule (500 mg total) by mouth 4 (four) times daily for 7 days., Disp: 28 capsule, Rfl: 0   clotrimazole  (LOTRIMIN ) 1 % cream, Apply 1 application  topically 2 (two) times daily as needed., Disp: , Rfl:    cyclobenzaprine  (FLEXERIL ) 5 MG tablet, Take 2.5-5 mg by mouth 2 (two) times daily as needed., Disp: , Rfl:    DULoxetine  (CYMBALTA ) 20 MG capsule, Take 1 capsule (20 mg total) by mouth daily. *Take with 30 mg capsule*, Disp: 30 capsule, Rfl: 7   DULoxetine  (CYMBALTA ) 30 MG capsule, Take 1 capsule (30 mg total) by mouth daily. *Take with 20 mg capsule.*, Disp: 30 capsule, Rfl: 9   estradiol  (ESTRACE ) 0.01 % CREA vaginal cream, Apply 1 gram daily per vagina for 2 weeks and then three times weekly, Disp: 42.5 g, Rfl: 12   gabapentin  (NEURONTIN ) 300 MG capsule, Take 2 capsules (600  mg total) by mouth 2 (two) times daily., Disp: 360 capsule, Rfl: 1   HYDROcodone -acetaminophen  (NORCO/VICODIN) 5-325 MG tablet, Take 1 tablet by mouth daily as needed for moderate pain., Disp: , Rfl:    lurasidone  (LATUDA ) 20 MG TABS tablet, Take 1 tablet (20 mg total) by mouth daily with supper., Disp: 30 tablet, Rfl: 2   naloxone (NARCAN) nasal spray 4 mg/0.1 mL, Place into the nose., Disp: , Rfl:    rosuvastatin  (CRESTOR ) 20 MG tablet, Take 1 tablet (20 mg total) by  mouth daily., Disp: 90 tablet, Rfl: 3   Semaglutide  (OZEMPIC , 2 MG/DOSE, [place]), Inject 2 mg into the skin every 7 (seven) days., Disp: , Rfl:    telmisartan  (MICARDIS ) 80 MG tablet, Take 1 tablet (80 mg total) by mouth daily., Disp: 90 tablet, Rfl: 3   tirzepatide  (ZEPBOUND ) 2.5 MG/0.5ML Pen, Inject 2.5 mg into the skin once a week., Disp: 2 mL, Rfl: 0   tirzepatide  (ZEPBOUND ) 5 MG/0.5ML Pen, Inject 5 mg into the skin once a week., Disp: 6 mL, Rfl: 1   traZODone  (DESYREL ) 50 MG tablet, Take 1 tablet (50 mg total) by mouth at bedtime as needed for sleep. for sleep, Disp: 30 tablet, Rfl: 2   Vibegron  (GEMTESA ) 75 MG TABS, Take 1 tablet (75 mg total) by mouth daily., Disp: 90 tablet, Rfl: 3   vitamin B-12 (CYANOCOBALAMIN ) 100 MCG tablet, Take 1 tablet (100 mcg total) by mouth daily., Disp: 90 tablet, Rfl: 3

## 2024-05-21 ENCOUNTER — Telehealth: Payer: Self-pay

## 2024-05-21 NOTE — Telephone Encounter (Signed)
 Post procedure follow up.  Patient states she is doing ok.

## 2024-05-22 ENCOUNTER — Ambulatory Visit

## 2024-05-22 DIAGNOSIS — R2689 Other abnormalities of gait and mobility: Secondary | ICD-10-CM

## 2024-05-22 DIAGNOSIS — M5459 Other low back pain: Secondary | ICD-10-CM | POA: Diagnosis not present

## 2024-05-22 DIAGNOSIS — M6281 Muscle weakness (generalized): Secondary | ICD-10-CM

## 2024-05-22 DIAGNOSIS — R262 Difficulty in walking, not elsewhere classified: Secondary | ICD-10-CM

## 2024-05-22 NOTE — Therapy (Signed)
 " OUTPATIENT PHYSICAL THERAPY THORACOLUMBAR AND BALANCE TREATMENT  Patient Name: Susan [name] MRN: [id number] DOB:1961-04-05, 64 y.o., female Today's Date: 05/22/2024  END OF SESSION:  PT End of Session - 05/22/24 1259     Visit Number 17    Number of Visits 24    Date for Recertification  07/16/24    Authorization Type [facility] Medicare Dual 2025    Authorization - Number of Visits 24    Progress Note Due on Visit 20    PT Start Time 1300    PT Stop Time 1345    PT Time Calculation (min) 45 min    Equipment Utilized During Treatment Gait belt    Activity Tolerance Patient tolerated treatment well    Behavior During Therapy WFL for tasks assessed/performed          Past Medical History:  Diagnosis Date   ADD (attention deficit disorder)    Alcohol abuse    Anemia    Anxiety    Aortic atherosclerosis 02/20/2018   Chest CT Sept 2019   Arthritis    rheumatoid arthritis   Asthma    Bipolar disorder (HCC)    Centrilobular emphysema (HCC)    Chronic diarrhea 07/22/2022   Cocaine use disorder, moderate, in sustained remission (HCC)    Constipation    Degenerative disc disease at L5-S1 level 09/28/2016   See ortho note May 2018   Depression    bipolar, hx of suicide attempt   Diabetes mellitus, type 2 (HCC)    Diverticulitis 2013   DOE (dyspnea on exertion)    Drug use    Family history of adverse reaction to anesthesia    mom-delayed emergence   GERD (gastroesophageal reflux disease)    H/O suicide attempt    slit wrists   Hepatitis C 06/26/2014   treated with Harvoni   Hip pain    History of echocardiogram    a. 04/2019 Echo: EF >65%, nl RV fxn.   History of MRSA infection 2013   HLD (hyperlipidemia)    Hypertension    a. 06/2021 Renal Duplex: ? bilat RAS; b. 06/2021 CTA Abd: No signif RAS.   Hyponatremia    Hypothyroidism    Incisional hernia 11/08/2012   Knee pain    Left carotid bruit    Morbid obesity (HCC)    Multinodular thyroid     OSA (obstructive  sleep apnea)    OSA on CPAP    Osteoporosis    Palpitations    Post-traumatic osteoarthritis of right knee 09/24/2015   Recurrent ventral hernia 11/08/2012   Skin lesion 06/28/2022   Status post total hip replacement, left 07/23/2019   Status post total right knee replacement using cement 05/10/2016   Stroke (HCC) 07/05/2022   right arm,torso and right leg numbness   Tobacco use disorder    Tobacco use disorder, continuous 06/26/2014   Vitamin B12 deficiency    Vitamin D  deficiency 04/09/2015   Past Surgical History:  Procedure Laterality Date   BILATERAL SALPINGOOPHORECTOMY     due to abnormal mass   BREAST BIOPSY Left    neg   BREAST SURGERY Left 20 yrs ago   CESAREAN SECTION     COLONOSCOPY     COLONOSCOPY WITH PROPOFOL  N/A 10/16/2017   Procedure: COLONOSCOPY WITH PROPOFOL ;  Surgeon: Therisa Bi, MD;  Location: [facility] ENDOSCOPY;  Service: Gastroenterology;  Laterality: N/A;   COLONOSCOPY WITH PROPOFOL  N/A 02/16/2021   Procedure: COLONOSCOPY WITH PROPOFOL ;  Surgeon: Therisa Bi, MD;  Location: [facility] ENDOSCOPY;  Service: Gastroenterology;  Laterality: N/A;   FORAMINOTOMY 1 LEVEL Right 10/10/2022   Procedure: RIGHT L4-5 LAMINOFORAMINOTOMY;  Surgeon: Clois Fret, MD;  Location: [facility] ORS;  Service: Neurosurgery;  Laterality: Right;   HERNIA REPAIR  06/2011, July 2014   Ventral wall repair with Physiomesh   HERNIA REPAIR     2nd.vental wall repair   JOINT REPLACEMENT Right    knee   LUMBAR LAMINECTOMY/ DECOMPRESSION WITH MET-RX N/A 10/12/2022   Procedure: LUMBAR LAMINECTOMY/ DECOMPRESSION WITH MET-RX;  Surgeon: Clois Fret, MD;  Location: [facility] ORS;  Service: Neurosurgery;  Laterality: N/A;   TONSILLECTOMY     TOTAL HIP ARTHROPLASTY Left 07/23/2019   Procedure: TOTAL HIP ARTHROPLASTY;  Surgeon: Edie Norleen PARAS, MD;  Location: [facility] ORS;  Service: Orthopedics;  Laterality: Left;   TOTAL KNEE ARTHROPLASTY Right 05/10/2016   Procedure: TOTAL KNEE ARTHROPLASTY;  Surgeon: Norleen PARAS Edie, MD;  Location: [facility] ORS;  Service: Orthopedics;  Laterality: Right;   TUBAL LIGATION     Patient Active Problem List   Diagnosis Date Noted   Tobacco use disorder 05/14/2024   Renal artery stenosis 04/24/2024   Family history of cerebrovascular accident (CVA) 04/24/2024   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   Chronic pain syndrome 02/15/2024   Multiple thyroid  nodules 01/30/2024   Urge incontinence 12/22/2023   Painful and cold lower extremity 12/22/2023   Hemiplegia and hemiparesis following cerebral infarction affecting right dominant side (HCC) 12/04/2023   Benign hypertension with CKD (chronic kidney disease) stage III (HCC) 12/04/2023   Dysuria 11/03/2023   History of stroke 04/17/2023   Right leg weakness 04/17/2023   Neurogenic bladder 02/13/2023   Abnormality of gait and mobility 02/13/2023   Morbid obesity with body mass index (BMI) of 40.0 to 44.9 in adult (HCC) 01/10/2023   Left thyroid  nodule 01/10/2023   Bipolar 1 disorder, depressed, moderate (HCC) 11/29/2022   Normocytic normochromic anemia 10/13/2022   Mass in epidural space 10/12/2022   Synovial cyst of lumbar facet joint 10/10/2022   Chronic radicular lumbar pain 10/10/2022   S/P laminectomy 10/10/2022   Epigastric pain 07/22/2022   Enlarged thyroid  07/22/2022   Type II diabetes mellitus with nephropathy (HCC) 06/16/2021   H/O thyroid  nodule 05/31/2021   History of hepatitis C- treated 2014- 2015 with [facility] gastroenterology and followed.  05/19/2021   Left carotid bruit 04/28/2021   Bipolar disorder, in full remission, most recent episode depressed 02/05/2021   Anxiety 05/28/2020   Bipolar affective disorder in remission 05/28/2020   Bipolar disorder, in full remission, most recent episode mixed 03/06/2020   Need for immunization against influenza 01/27/2020   Screening for lung cancer 01/27/2020   Morbid obesity (HCC) 01/27/2020   Osteoporosis 11/06/2019   Bipolar 1 disorder, mixed, mild (HCC)  10/29/2019   PTSD (post-traumatic stress disorder) 09/27/2019   Bereavement 09/27/2019   Cocaine use disorder, moderate, in sustained remission (HCC) 09/27/2019   Primary osteoarthritis of left hip 06/14/2019   Allergic rhinitis 08/23/2018   Aortic atherosclerosis 02/20/2018   Centrilobular emphysema (HCC) 02/20/2018   Hyperlipidemia 03/28/2017   Degenerative disc disease at L5-S1 level 09/28/2016   GERD (gastroesophageal reflux disease) 12/17/2015   Chronic back pain 10/09/2015   Post-traumatic osteoarthritis of right knee 09/24/2015   Alcohol use disorder, severe, in sustained remission (HCC) 04/13/2015   OSA on CPAP 04/09/2015   Subclinical hyperthyroidism 06/26/2014   Hypertension 06/26/2014   Bipolar I disorder (HCC) 06/26/2014   Lumbar radiculitis 10/03/2013   Diverticulosis  08/24/2013    PCP: Dr. Luke Shade     REFERRING PROVIDER: Dr. Reeves Daisy    REFERRING DIAG: [phone] (ICD-10-CM) - Chronic bilateral low back pain without sciatica  Rationale for Evaluation and Treatment: Rehabilitation  THERAPY DIAG:  Other low back pain  Imbalance  Difficulty in walking, not elsewhere classified  Muscle weakness (generalized)  ONSET DATE: 10/2022  SUBJECTIVE:                                                                                                                                                                                           SUBJECTIVE STATEMENT:    Pt reports that she is feeling better and her back is not hurting like it was on Monday.    PERTINENT HISTORY:  She has prior history of left sided stroke affecting her right side in February 2024. She also had a L3-L4 lumbar decompression surgery in June 2024 with complications due to a epidural hematoma that resulted in cauda equinda syndrome and her needing to return to ER for evacuation and she was in the hospital for an additional 8 days following surgery.  She is just moving back to [place]  from [place], because she has qualified for housing in [place]. She describes being most concerned with her balance especially when walking over uneven surfaces. She mainly uses a a 2WW when walking to places that she has to drive to because it is easier to transport. She has not had a fall in the past 6 mo, but she is scared of falling especially when walking over uneven surfaces. She does report an improvement in LE function with her now being able to raise her toes and that she was not able to do this after surgery.   PAIN:  Are you having pain? Yes: NPRS scale: 5/10       Pain location: Right ischial tuberosity   Pain description: Dull and achy      Aggravating factors: Walking and performing hip abduction  Relieving factors: Using ice and keep still     PRECAUTIONS: Fall; she is afraid of falling    RED FLAGS: None   WEIGHT BEARING RESTRICTIONS: No  FALLS:  Has patient fallen in last 6 months? No  LIVING ENVIRONMENT: Lives with: lives alone Lives in: House/apartment Stairs: Yes: External: 4 steps; on right going up and there is a ramp going to a house and there is a ramped main entrance leading to lobby. Place is called [employer]   Has following equipment at home: Single point cane, Environmental Consultant - 2 wheeled, Environmental Consultant - 4 wheeled, shower chair, Grab bars, and Ramped entry  OCCUPATION: Retired   PLOF: Independent  PATIENT GOALS: Pt wants to be able to walk outside using single point cane and to feel more confidence when walking especially over uneven surfaces. She currently uses rollator to walk dog at home and 2WW when walking anywhere where she has to drive.   NEXT MD VISIT: October 2025     OBJECTIVE:  Note: Objective measures were completed at Evaluation unless otherwise noted.  VITALS BP 118/66 HR  60 Sp02  97%  DIAGNOSTIC FINDINGS:   EXAM: MRI LUMBAR SPINE 02/23/2024  IMPRESSION: 1. No compressive central canal stenosis or acute neural impingement. 2.  Moderate left lateral recess stenosis at L2-3 without definite neural compression. 3. Postoperative changes at L3-4 without recurrent canal stenosis; left foraminal narrowing that could affect the L3 nerve. Resolution of the previously seen posterior extradural fluid collection. 4. Scoliotic curvature convex to the right with apex at L2-3, similar to the prior exam. 5. Mild edematous endplate changes at L2-3 and L3-4, which may contribute to regional back pain. 6. No anatomic limitation anticipated for spinal cord stimulator placement at T11-12 or T12-L1.   Electronically signed by: Oneil Officer MD 02/26/2024 09:46 AM EDT RP Workstation: [id number]  ////////////////////////////////////////////////////////////////////////////////////////////// CLINICAL DATA:  Low back pain. Cauda equina syndrome suspected. Cyst removal from the spine two days ago. Postoperative urinary incontinence and bilateral leg numbness.   EXAM: MRI LUMBAR SPINE WITHOUT CONTRAST   TECHNIQUE: Multiplanar, multisequence MR imaging of the lumbar spine was performed. No intravenous contrast was administered.   COMPARISON:  05/18/2022   FINDINGS: Segmentation: 5 lumbar type vertebral bodies as numbered previously.   Alignment: Scoliotic curvature convex to the right as seen previously. No significant listhesis.   Vertebrae: Edematous change of the endplates at T10, U88 and T12, likely to relate to regional pain. This is probably degenerative. Lesser vertebral endplate edema noted at L3-4 and L4-5.   Conus medullaris and cauda equina: Conus extends to the L1 level. Conus and cauda equina appear normal.   Paraspinal and other soft tissues: Posterior surgical approach at the L4-5 level from the right-side has an expected appearance.   Disc levels:   At the surgical level and extending within the spinal canal from the upper L3 level to the L 4 5 disc level, there is a fluid collection which causes marked  compression of the thecal sac. This is most severe at the level of the L3-4 disc where the fluid collection within the canal measures 1.8 x 1.5 cm in diameter and markedly flattens the thecal sac anteriorly.   Other than the surgical decompression of the synovial cyst on the right and the postoperative intraspinal fluid collection, there is no change since the preoperative exam, as one might expect.   IMPRESSION: 1. At the surgical level and extending from the upper L3 level to the L4-5 disc level, there is a postoperative fluid collection within the spinal canal which causes marked compression of the thecal sac anteriorly. This is most severe at the level of the L3-4 disc where the fluid collection within the canal measures 1.8 x 1.5 cm in diameter and markedly flattens the thecal sac anteriorly.     Electronically Signed   By: Oneil Officer M.D.   On: 10/12/2022 16:20  PATIENT SURVEYS:  ABC scale: The Activities-Specific Balance Confidence (ABC) Scale 0% 10 20 30  40 50 60 70 80 90 100% No confidence<->completely confident  How confident are you that you will not lose your balance or become unsteady  when you . . .   Date tested 01/01/24  Walk around the house 80%  2. Walk up or down stairs 80%  3. Bend over and pick up a slipper from in front of a closet floor 60%  4. Reach for a small can off a shelf at eye level 90%  5. Stand on tip toes and reach for something above your head 0%  6. Stand on a chair and reach for something 40%  7. Sweep the floor 50%  8. Walk outside the house to a car parked in the driveway 90%  9. Get into or out of a car 80%  10. Walk across a parking lot to the mall 80%  11. Walk up or down a ramp 80%  12. Walk in a crowded mall where people rapidly walk past you 60%  13. Are bumped into by people as you walk through the mall 40%  14. Step onto or off of an escalator while you are holding onto the railing 50%  15. Step onto or off an escalator while  holding onto parcels such that you cannot hold onto the railing 30%  16. Walk outside on icy sidewalks 0%  Total: #/16  57% (910/1600)       COGNITION: Overall cognitive status: Within functional limits for tasks assessed     SENSATION: Light touch: Impaired electricity down her RLE   MUSCLE LENGTH: Hamstrings: Not tested   Debby test: Not tested    POSTURE: rounded shoulders  PALPATION: Not performed    LUMBAR ROM:   AROM eval  Flexion 80%  Extension 100%  Right lateral flexion 100%  Left lateral flexion 100%  Right rotation 100%  Left rotation 100%   (Blank rows = not tested)   LOWER EXTREMITY MMT:    MMT Right eval Left eval  Hip flexion 4- 4  Knee flexion 4 4  Knee extension 4 4  Ankle dorsiflexion 4 4   (Blank rows = not tested)    FUNCTIONAL TESTS:  5 times sit to stand: 14 sec , 03/07/24:11 sec  30 seconds chair stand test: 10?30/25:  11 reps, 13 reps    Timed up and go (TUG): 15.38 sec with use of single point cane   6 minute walk test: 553 ft    10 meter walk test: 0.61 m/sec                 DGI:  13/26                       <=19/24  predictive of falls in the elderly     GAIT: Distance walked: 30 ft    Assistive device utilized: Walker - 2 wheeled Level of assistance: Modified independence Comments: Significant use of BUE support with decrease step length bilaterally.    TREATMENT DATE: 05/22/2024  TherAct:  Ambulation around the gym, x3 laps, utilizing cane and CGA with gait belt donned  STS 2x10  Ambulation up steps, B UE for support, alternating, x10  Seated rest break utilized and vitals monitored Lateral step up onto 6 step, x10 from each side  Seated rest break utilized and vitals monitored  The patient completed 5 minutes at level(s) 3 on the NuStep using both BUE/BLE reciprocal movements to promote strength, endurance, and cardiorespiratory fitness. PT increased the resistance level and monitored the patient's response to  the intervention throughout. The patient required min cueing for technique and supervision level of assistance.  Neuro:  Ambulation with eyes closed, 10' in total, pt with significant difficulty and fear of falling with the attempt     PATIENT EDUCATION:  Education details: Form and technique for correct performance of exercise and explanation about cut-offs for functional tests.  Person educated: Patient Education method: Explanation, Demonstration, Verbal cues, and Handouts Education comprehension: verbalized understanding, returned demonstration, and verbal cues required  HOME EXERCISE PROGRAM: Access Code: [email or web address] URL: [email or web address] Date: 02/29/2024 Prepared by: Toribio Servant  Program Notes Toe to heel on right foot with mirror in front of you 3 sets  x 20 reps 5-7 days per week    Exercises - Seated Slump Neural Tensioner  - 1 x daily - 7 x weekly - 1 sets - 10 reps - Seated Hamstring Stretch  - 1 x daily - 7 x weekly - 2-3 reps - 60 sec hold - Seated Hamstring Stretch (Mirrored)  - 1 x daily - 7 x weekly - 2-3 reps - 60 sec hold - Alternating Step Taps with Counter Support (Mirrored)  - 5-7 x weekly - 3 sets - 20 reps - Sit to Stand  - 3-4 x weekly - 3 sets - 10 reps - Standing Ankle Dorsiflexion with Table Support  - 3-4 x weekly - 3 sets - 10 reps - Standing March with Counter Support (Mirrored)  - 3-4 x weekly - 3 sets - 10 reps - Standing Knee Flexion AROM with Chair Support (Mirrored)  - 3-4 x weekly - 3 sets - 10 reps - Standing Hip Abduction with Counter Support (Mirrored)  - 3-4 x weekly - 3 sets - 10 reps - Long Sitting Ankle Plantar Flexion with Resistance (Mirrored)  - 3-4 x weekly - 3 sets - 15 reps  CLINICAL IMPRESSION:    Pt in much better spirits and was able to participate in exercises without becoming too fatigued.  Pt much more motivated throughout the session.  Pt was able to progress and perform a balance related activity as  well.  Pt still exhibits shortness of breath at times and therapist monitored vitals, however HR and SpO2 remained WFL throughout the session.  Pt encouraged to be more mobile at home and perform HEP in preparation for tasks at therapy sessions.   Pt will continue to benefit from skilled therapy to address remaining deficits in order to improve overall QoL and return to PLOF.       OBJECTIVE IMPAIRMENTS: Abnormal gait, decreased endurance, decreased ROM, decreased strength, impaired flexibility, impaired sensation, obesity, and pain.   ACTIVITY LIMITATIONS: carrying, lifting, bending, standing, squatting, stairs, dressing, hygiene/grooming, and locomotion level  PARTICIPATION LIMITATIONS: meal prep, cleaning, laundry, shopping, community activity, and yard work  PERSONAL FACTORS: Fitness, Past/current experiences, Time since onset of injury/illness/exacerbation, and 3+ comorbidities: h/o stroke with right sided deficits, L3-L5 decompression, HLD, HTN are also affecting patient's functional outcome.   REHAB POTENTIAL: Fair multiple co-morbidities and chronicity of condition  CLINICAL DECISION MAKING: Evolving/moderate complexity  EVALUATION COMPLEXITY: Moderate   GOALS: Goals reviewed with patient? No  SHORT TERM GOALS: Target date: 06/04/2024  Patient will demonstrate undestanding of home exercise plan by performing exercises correctly with evidence of good carry over with min to no verbal or tactile cues .   Baseline: NT  01/16/24: Performing Independently  Goal status: ACHIEVED     2.   Pt will increase by at least 34m (133ft) in order to demonstrate clinically significant improvement in cardiopulmonary endurance and community ambulation Baseline: 8/28: 553' with  RW and unable to complete 6 minutes (stopped at 5 minutes)   Goal status: NOT MET     3.  Patient will show decreased risk of falling as evidence by decrease in self-selected gait speed by >= 0.12 m/sec.  Baseline: 8/28:  0.61 m/s Goal status: NOT MET    4.  Pt will decrease 5TSTS by at least 3 seconds to demonstrate a minimal clinically significant improvement in LE strength.  Baseline: 14 seconds  Goal status: NOT MET     LONG TERM GOALS: Target date: 07/16/2024  Patient will improve activities specific balance scale score >=67% as evidence of an improvement self-perception of function and to decrease risk for falls Mae et al., 2022).  Baseline: 57%  02/14/24: 33% 03/07/24: 44% (700/1600); 04/23/24: 770/1600 = 48%  Goal status: ONGOING    2.  Patient will ambulate >=1000 ft in to show community ambulator status to be able to have aerobic endurance to return to fulfilling walking tasks at his job.  Baseline: 8/28: 553' with RW and unable to complete 6 minutes (stopped at 5 minutes)  02/05/24: 558 ft with 2WW; 04/23/24: 686' using RW Goal status: ONGOING    3.  Patient will demonstrate reduced falls risk as evidenced by Dynamic Gait Index (DGI) >19/24 to decrease risk of falling.  Baseline: 13/24 with use of single point cane in RUE   02/19/24: 15/24-  mild for most, moderate for stairs and obstacle over.; 04/23/24: using SPC, 14/24   Goal status: ONGOING     4. Patient will perform in >=1.0 m./sec as evidence of improved mobility, decreased falls risk, and overall functional independence.  Baseline: 8/28: 0.61 m/s  02/12/24: 0.73 m/sec; 04/23/24: with no AD: 0.55 m/sec, SPC: 0.55 m/s Goal status: ONGOING   PLAN:  PT FREQUENCY: 1-2x/week  PT DURATION: 12 weeks  PLANNED INTERVENTIONS: [id number]- PT Re-evaluation, 97750- Physical Performance Testing, 97110-Therapeutic exercises, 97530- Therapeutic activity, 97112- Neuromuscular re-education, 97535- Self Care, 02859- Manual therapy, [id number]- Gait training, [phone]- Orthotic Initial, [phone]- Orthotic/Prosthetic subsequent, [phone]- Canalith repositioning, [id number]- Aquatic Therapy, [id number]- Electrical stimulation (unattended), [phone]- Electrical stimulation  (manual), [id number]- Ultrasound, 79439 (1-2 muscles), 20561 (3+ muscles)- Dry Needling, Patient/Family education, Balance training, Stair training, Taping, Joint mobilization, Joint manipulation, Spinal manipulation, Spinal mobilization, Scar mobilization, Vestibular training, DME instructions, Cryotherapy, Moist heat, and Biofeedback.  PLAN FOR NEXT SESSION:   Glut max strengthening. Gait without AD or at least with [facility] to progress LE strength/balance.    Fonda Simpers, PT, DPT Physical Therapist - [facility]  05/22/2024, 12:59 PM  "

## 2024-05-27 ENCOUNTER — Ambulatory Visit

## 2024-05-27 DIAGNOSIS — M6281 Muscle weakness (generalized): Secondary | ICD-10-CM

## 2024-05-27 DIAGNOSIS — M5459 Other low back pain: Secondary | ICD-10-CM

## 2024-05-27 DIAGNOSIS — R2689 Other abnormalities of gait and mobility: Secondary | ICD-10-CM

## 2024-05-27 DIAGNOSIS — R262 Difficulty in walking, not elsewhere classified: Secondary | ICD-10-CM

## 2024-05-27 NOTE — Therapy (Signed)
 " OUTPATIENT PHYSICAL THERAPY THORACOLUMBAR AND BALANCE TREATMENT  Patient Name: Susan [name] MRN: [id number] DOB:03-Dec-1960, 64 y.o., female Today's Date: 05/27/2024  END OF SESSION:  PT End of Session - 05/27/24 1301     Visit Number 18    Number of Visits 24    Date for Recertification  07/16/24    Authorization Type [employer] Medicare Dual 2025    Authorization - Number of Visits 24    Progress Note Due on Visit 20    PT Start Time 1302    PT Stop Time 1345    PT Time Calculation (min) 43 min    Equipment Utilized During Treatment Gait belt    Activity Tolerance Patient tolerated treatment well    Behavior During Therapy WFL for tasks assessed/performed           Past Medical History:  Diagnosis Date   ADD (attention deficit disorder)    Alcohol abuse    Anemia    Anxiety    Aortic atherosclerosis 02/20/2018   Chest CT Sept 2019   Arthritis    rheumatoid arthritis   Asthma    Bipolar disorder (HCC)    Centrilobular emphysema (HCC)    Chronic diarrhea 07/22/2022   Cocaine use disorder, moderate, in sustained remission (HCC)    Constipation    Degenerative disc disease at L5-S1 level 09/28/2016   See ortho note May 2018   Depression    bipolar, hx of suicide attempt   Diabetes mellitus, type 2 (HCC)    Diverticulitis 2013   DOE (dyspnea on exertion)    Drug use    Family history of adverse reaction to anesthesia    mom-delayed emergence   GERD (gastroesophageal reflux disease)    H/O suicide attempt    slit wrists   Hepatitis C 06/26/2014   treated with Harvoni   Hip pain    History of echocardiogram    a. 04/2019 Echo: EF >65%, nl RV fxn.   History of MRSA infection 2013   HLD (hyperlipidemia)    Hypertension    a. 06/2021 Renal Duplex: ? bilat RAS; b. 06/2021 CTA Abd: No signif RAS.   Hyponatremia    Hypothyroidism    Incisional hernia 11/08/2012   Knee pain    Left carotid bruit    Morbid obesity (HCC)    Multinodular thyroid     OSA (obstructive  sleep apnea)    OSA on CPAP    Osteoporosis    Palpitations    Post-traumatic osteoarthritis of right knee 09/24/2015   Recurrent ventral hernia 11/08/2012   Skin lesion 06/28/2022   Status post total hip replacement, left 07/23/2019   Status post total right knee replacement using cement 05/10/2016   Stroke (HCC) 07/05/2022   right arm,torso and right leg numbness   Tobacco use disorder    Tobacco use disorder, continuous 06/26/2014   Vitamin B12 deficiency    Vitamin D  deficiency 04/09/2015   Past Surgical History:  Procedure Laterality Date   BILATERAL SALPINGOOPHORECTOMY     due to abnormal mass   BREAST BIOPSY Left    neg   BREAST SURGERY Left 20 yrs ago   CESAREAN SECTION     COLONOSCOPY     COLONOSCOPY WITH PROPOFOL  N/A 10/16/2017   Procedure: COLONOSCOPY WITH PROPOFOL ;  Surgeon: Therisa Bi, MD;  Location: [facility] END[facility]COPY;  Service: Gastroenterology;  Laterality: N/A;   COLONOSCOPY WITH PROPOFOL  N/A 02/16/2021   Procedure: COLONOSCOPY WITH PROPOFOL ;  Surgeon: Therisa Bi, MD;  Location: [facility] ENDOSCOPY;  Service: Gastroenterology;  Laterality: N/A;   FORAMINOTOMY 1 LEVEL Right 10/10/2022   Procedure: RIGHT L4-5 LAMINOFORAMINOTOMY;  Surgeon: Clois Fret, MD;  Location: [facility] ORS;  Service: Neurosurgery;  Laterality: Right;   HERNIA REPAIR  06/2011, July 2014   Ventral wall repair with Physiomesh   HERNIA REPAIR     2nd.vental wall repair   JOINT REPLACEMENT Right    knee   LUMBAR LAMINECTOMY/ DECOMPRESSION WITH MET-RX N/A 10/12/2022   Procedure: LUMBAR LAMINECTOMY/ DECOMPRESSION WITH MET-RX;  Surgeon: Clois Fret, MD;  Location: [facility] ORS;  Service: Neurosurgery;  Laterality: N/A;   TONSILLECTOMY     TOTAL HIP ARTHROPLASTY Left 07/23/2019   Procedure: TOTAL HIP ARTHROPLASTY;  Surgeon: Edie Norleen PARAS, MD;  Location: [facility] ORS;  Service: Orthopedics;  Laterality: Left;   TOTAL KNEE ARTHROPLASTY Right 05/10/2016   Procedure: TOTAL KNEE ARTHROPLASTY;  Surgeon: Norleen PARAS Edie, MD;  Location: [facility] ORS;  Service: Orthopedics;  Laterality: Right;   TUBAL LIGATION     Patient Active Problem List   Diagnosis Date Noted   Tobacco use disorder 05/14/2024   Renal artery stenosis 04/24/2024   Family history of cerebrovascular accident (CVA) 04/24/2024   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   Chronic pain syndrome 02/15/2024   Multiple thyroid  nodules 01/30/2024   Urge incontinence 12/22/2023   Painful and cold lower extremity 12/22/2023   Hemiplegia and hemiparesis following cerebral infarction affecting right dominant side (HCC) 12/04/2023   Benign hypertension with CKD (chronic kidney disease) stage III (HCC) 12/04/2023   Dysuria 11/03/2023   History of stroke 04/17/2023   Right leg weakness 04/17/2023   Neurogenic bladder 02/13/2023   Abnormality of gait and mobility 02/13/2023   Morbid obesity with body mass index (BMI) of 40.0 to 44.9 in adult (HCC) 01/10/2023   Left thyroid  nodule 01/10/2023   Bipolar 1 disorder, depressed, moderate (HCC) 11/29/2022   Normocytic normochromic anemia 10/13/2022   Mass in epidural space 10/12/2022   Synovial cyst of lumbar facet joint 10/10/2022   Chronic radicular lumbar pain 10/10/2022   S/P laminectomy 10/10/2022   Epigastric pain 07/22/2022   Enlarged thyroid  07/22/2022   Type II diabetes mellitus with nephropathy (HCC) 06/16/2021   H/O thyroid  nodule 05/31/2021   History of hepatitis C- treated 2014- 2015 with [facility] gastroenterology and followed.  05/19/2021   Left carotid bruit 04/28/2021   Bipolar disorder, in full remission, most recent episode depressed 02/05/2021   Anxiety 05/28/2020   Bipolar affective disorder in remission 05/28/2020   Bipolar disorder, in full remission, most recent episode mixed 03/06/2020   Need for immunization against influenza 01/27/2020   Screening for lung cancer 01/27/2020   Morbid obesity (HCC) 01/27/2020   Osteoporosis 11/06/2019   Bipolar 1 disorder, mixed, mild (HCC)  10/29/2019   PTSD (post-traumatic stress disorder) 09/27/2019   Bereavement 09/27/2019   Cocaine use disorder, moderate, in sustained remission (HCC) 09/27/2019   Primary osteoarthritis of left hip 06/14/2019   Allergic rhinitis 08/23/2018   Aortic atherosclerosis 02/20/2018   Centrilobular emphysema (HCC) 02/20/2018   Hyperlipidemia 03/28/2017   Degenerative disc disease at L5-S1 level 09/28/2016   GERD (gastroesophageal reflux disease) 12/17/2015   Chronic back pain 10/09/2015   Post-traumatic osteoarthritis of right knee 09/24/2015   Alcohol use disorder, severe, in sustained remission (HCC) 04/13/2015   OSA on CPAP 04/09/2015   Subclinical hyperthyroidism 06/26/2014   Hypertension 06/26/2014   Bipolar I disorder (HCC) 06/26/2014   Lumbar radiculitis 10/03/2013   Diverticulosis  08/24/2013    PCP: Dr. Luke Shade     REFERRING PROVIDER: Dr. Reeves Daisy    REFERRING DIAG: [id number] (ICD-10-CM) - Chronic bilateral low back pain without sciatica  Rationale for Evaluation and Treatment: Rehabilitation  THERAPY DIAG:  Other low back pain  Imbalance  Difficulty in walking, not elsewhere classified  Muscle weakness (generalized)  ONSET DATE: 10/2022  SUBJECTIVE:                                                                                                                                                                                           SUBJECTIVE STATEMENT:    Pt reports she was mopping her floor before coming here, so she's already done some exercising.     PERTINENT HISTORY:  She has prior history of left sided stroke affecting her right side in February 2024. She also had a L3-L4 lumbar decompression surgery in June 2024 with complications due to a epidural hematoma that resulted in cauda equinda syndrome and her needing to return to ER for evacuation and she was in the hospital for an additional 8 days following surgery.  She is just moving back to  [place] from [place], because she has qualified for housing in [place]. She describes being most concerned with her balance especially when walking over uneven surfaces. She mainly uses a a 2WW when walking to places that she has to drive to because it is easier to transport. She has not had a fall in the past 6 mo, but she is scared of falling especially when walking over uneven surfaces. She does report an improvement in LE function with her now being able to raise her toes and that she was not able to do this after surgery.   PAIN:  Are you having pain? Yes: NPRS scale: 5/10       Pain location: Right ischial tuberosity   Pain description: Dull and achy      Aggravating factors: Walking and performing hip abduction  Relieving factors: Using ice and keep still     PRECAUTIONS: Fall; she is afraid of falling    RED FLAGS: None   WEIGHT BEARING RESTRICTIONS: No  FALLS:  Has patient fallen in last 6 months? No  LIVING ENVIRONMENT: Lives with: lives alone Lives in: House/apartment Stairs: Yes: External: 4 steps; on right going up and there is a ramp going to a house and there is a ramped main entrance leading to lobby. Place is called [employer]   Has following equipment at home: Single point cane, Environmental Consultant - 2 wheeled, Environmental Consultant - 4 wheeled, shower chair, Grab bars, and Ramped entry  OCCUPATION: Retired   PLOF: Independent  PATIENT GOALS: Pt wants to be able to walk outside using single point cane and to feel more confidence when walking especially over uneven surfaces. She currently uses rollator to walk dog at home and 2WW when walking anywhere where she has to drive.   NEXT MD VISIT: October 2025     OBJECTIVE:  Note: Objective measures were completed at Evaluation unless otherwise noted.  VITALS BP 118/66 HR  60 Sp02  97%  DIAGNOSTIC FINDINGS:   EXAM: MRI LUMBAR SPINE 02/23/2024  IMPRESSION: 1. No compressive central canal stenosis or acute neural  impingement. 2. Moderate left lateral recess stenosis at L2-3 without definite neural compression. 3. Postoperative changes at L3-4 without recurrent canal stenosis; left foraminal narrowing that could affect the L3 nerve. Resolution of the previously seen posterior extradural fluid collection. 4. Scoliotic curvature convex to the right with apex at L2-3, similar to the prior exam. 5. Mild edematous endplate changes at L2-3 and L3-4, which may contribute to regional back pain. 6. No anatomic limitation anticipated for spinal cord stimulator placement at T11-12 or T12-L1.   Electronically signed by: Oneil Officer MD 02/26/2024 09:46 AM EDT RP Workstation: [id number]  ////////////////////////////////////////////////////////////////////////////////////////////// CLINICAL DATA:  Low back pain. Cauda equina syndrome suspected. Cyst removal from the spine two days ago. Postoperative urinary incontinence and bilateral leg numbness.   EXAM: MRI LUMBAR SPINE WITHOUT CONTRAST   TECHNIQUE: Multiplanar, multisequence MR imaging of the lumbar spine was performed. No intravenous contrast was administered.   COMPARISON:  05/18/2022   FINDINGS: Segmentation: 5 lumbar type vertebral bodies as numbered previously.   Alignment: Scoliotic curvature convex to the right as seen previously. No significant listhesis.   Vertebrae: Edematous change of the endplates at T10, U88 and T12, likely to relate to regional pain. This is probably degenerative. Lesser vertebral endplate edema noted at L3-4 and L4-5.   Conus medullaris and cauda equina: Conus extends to the L1 level. Conus and cauda equina appear normal.   Paraspinal and other soft tissues: Posterior surgical approach at the L4-5 level from the right-side has an expected appearance.   Disc levels:   At the surgical level and extending within the spinal canal from the upper L3 level to the L 4 5 disc level, there is a fluid collection which  causes marked compression of the thecal sac. This is most severe at the level of the L3-4 disc where the fluid collection within the canal measures 1.8 x 1.5 cm in diameter and markedly flattens the thecal sac anteriorly.   Other than the surgical decompression of the synovial cyst on the right and the postoperative intraspinal fluid collection, there is no change since the preoperative exam, as one might expect.   IMPRESSION: 1. At the surgical level and extending from the upper L3 level to the L4-5 disc level, there is a postoperative fluid collection within the spinal canal which causes marked compression of the thecal sac anteriorly. This is most severe at the level of the L3-4 disc where the fluid collection within the canal measures 1.8 x 1.5 cm in diameter and markedly flattens the thecal sac anteriorly.     Electronically Signed   By: Oneil Officer M.D.   On: 10/12/2022 16:20  PATIENT SURVEYS:  ABC scale: The Activities-Specific Balance Confidence (ABC) Scale 0% 10 20 30  40 50 60 70 80 90 100% No confidence<->completely confident  How confident are you that you will not lose your balance or become unsteady  when you . . .   Date tested 01/01/24  Walk around the house 80%  2. Walk up or down stairs 80%  3. Bend over and pick up a slipper from in front of a closet floor 60%  4. Reach for a small can off a shelf at eye level 90%  5. Stand on tip toes and reach for something above your head 0%  6. Stand on a chair and reach for something 40%  7. Sweep the floor 50%  8. Walk outside the house to a car parked in the driveway 90%  9. Get into or out of a car 80%  10. Walk across a parking lot to the mall 80%  11. Walk up or down a ramp 80%  12. Walk in a crowded mall where people rapidly walk past you 60%  13. Are bumped into by people as you walk through the mall 40%  14. Step onto or off of an escalator while you are holding onto the railing 50%  15. Step onto or off an  escalator while holding onto parcels such that you cannot hold onto the railing 30%  16. Walk outside on icy sidewalks 0%  Total: #/16  57% (910/1600)       COGNITION: Overall cognitive status: Within functional limits for tasks assessed     SENSATION: Light touch: Impaired electricity down her RLE   MUSCLE LENGTH: Hamstrings: Not tested   Debby test: Not tested    POSTURE: rounded shoulders  PALPATION: Not performed    LUMBAR ROM:   AROM eval  Flexion 80%  Extension 100%  Right lateral flexion 100%  Left lateral flexion 100%  Right rotation 100%  Left rotation 100%   (Blank rows = not tested)   LOWER EXTREMITY MMT:    MMT Right eval Left eval  Hip flexion 4- 4  Knee flexion 4 4  Knee extension 4 4  Ankle dorsiflexion 4 4   (Blank rows = not tested)    FUNCTIONAL TESTS:  5 times sit to stand: 14 sec , 03/07/24:11 sec  30 seconds chair stand test: 10?30/25:  11 reps, 13 reps    Timed up and go (TUG): 15.38 sec with use of single point cane   6 minute walk test: 553 ft    10 meter walk test: 0.61 m/sec                 DGI:  13/26                       <=19/24  predictive of falls in the elderly    GAIT: Distance walked: 30 ft    Assistive device utilized: Walker - 2 wheeled Level of assistance: Modified independence Comments: Significant use of BUE support with decrease step length bilaterally.    TREATMENT DATE: 05/27/2024  TherAct:  Ambulation around the gym, x3 laps, utilizing cane and CGA with gait belt donned  STS with airex pad under feet for added challenge, 2x10  Ambulation up steps, B UE for support, alternating, x10  Seated rest break utilized and vitals monitored Lateral step up onto 6 step, x10 from each side  Seated rest break utilized and vitals monitored  The patient completed 9 minutes at level(s) 1-5 on the NuStep using both BUE/BLE reciprocal movements to promote strength, endurance, and cardiorespiratory fitness. PT increased  the resistance level and monitored the patient's response to the intervention throughout. The patient required min cueing  for technique and supervision level of assistance.   Standing hip marches at Matrix, 25#, 2x10 each LE Standing hip adduction at Matrix, 25#, 2x10 each LE Standing hip abduction at Matrix, 25#, 2x10 each LE Standing hip extension at Matrix, 25#, 2x10 each LE     PATIENT EDUCATION:  Education details: Form and technique for correct performance of exercise and explanation about cut-offs for functional tests.  Person educated: Patient Education method: Explanation, Demonstration, Verbal cues, and Handouts Education comprehension: verbalized understanding, returned demonstration, and verbal cues required  HOME EXERCISE PROGRAM: Access Code: [email or web address] URL: [email or web address] Date: 02/29/2024 Prepared by: Toribio Servant  Program Notes Toe to heel on right foot with mirror in front of you 3 sets  x 20 reps 5-7 days per week    Exercises - Seated Slump Neural Tensioner  - 1 x daily - 7 x weekly - 1 sets - 10 reps - Seated Hamstring Stretch  - 1 x daily - 7 x weekly - 2-3 reps - 60 sec hold - Seated Hamstring Stretch (Mirrored)  - 1 x daily - 7 x weekly - 2-3 reps - 60 sec hold - Alternating Step Taps with Counter Support (Mirrored)  - 5-7 x weekly - 3 sets - 20 reps - Sit to Stand  - 3-4 x weekly - 3 sets - 10 reps - Standing Ankle Dorsiflexion with Table Support  - 3-4 x weekly - 3 sets - 10 reps - Standing March with Counter Support (Mirrored)  - 3-4 x weekly - 3 sets - 10 reps - Standing Knee Flexion AROM with Chair Support (Mirrored)  - 3-4 x weekly - 3 sets - 10 reps - Standing Hip Abduction with Counter Support (Mirrored)  - 3-4 x weekly - 3 sets - 10 reps - Long Sitting Ankle Plantar Flexion with Resistance (Mirrored)  - 3-4 x weekly - 3 sets - 15 reps  CLINICAL IMPRESSION:     Patient responded well performing the newly introduced exercises  and was able to perform them without any difficulty.  Patient noted that she felt like she got a good workout today.  Patient reported that she was sweating and felt that she really worked on her legs today compared to previous sessions.  Patient still struggles to perform the lateral step ups going to her left side due to the weakness in that left lower extremity.  This will continue to be a focal point of sessions in the future.   Pt will continue to benefit from skilled therapy to address remaining deficits in order to improve overall QoL and return to PLOF.     Note: Portions of this document were prepared using Dragon voice recognition software and although reviewed may contain unintentional dictation errors in syntax, grammar, or spelling.   OBJECTIVE IMPAIRMENTS: Abnormal gait, decreased endurance, decreased ROM, decreased strength, impaired flexibility, impaired sensation, obesity, and pain.   ACTIVITY LIMITATIONS: carrying, lifting, bending, standing, squatting, stairs, dressing, hygiene/grooming, and locomotion level  PARTICIPATION LIMITATIONS: meal prep, cleaning, laundry, shopping, community activity, and yard work  PERSONAL FACTORS: Fitness, Past/current experiences, Time since onset of injury/illness/exacerbation, and 3+ comorbidities: h/o stroke with right sided deficits, L3-L5 decompression, HLD, HTN are also affecting patient's functional outcome.   REHAB POTENTIAL: Fair multiple co-morbidities and chronicity of condition  CLINICAL DECISION MAKING: Evolving/moderate complexity  EVALUATION COMPLEXITY: Moderate   GOALS: Goals reviewed with patient? No  SHORT TERM GOALS: Target date: 06/04/2024  Patient will demonstrate undestanding of home exercise plan by performing exercises  correctly with evidence of good carry over with min to no verbal or tactile cues .   Baseline: NT  01/16/24: Performing Independently  Goal status: ACHIEVED     2.   Pt will increase by at least  38m (121ft) in order to demonstrate clinically significant improvement in cardiopulmonary endurance and community ambulation Baseline: 8/28: 553' with RW and unable to complete 6 minutes (stopped at 5 minutes)   Goal status: NOT MET     3.  Patient will show decreased risk of falling as evidence by decrease in self-selected gait speed by >= 0.12 m/sec.  Baseline: 8/28: 0.61 m/s Goal status: NOT MET    4.  Pt will decrease 5TSTS by at least 3 seconds to demonstrate a minimal clinically significant improvement in LE strength.  Baseline: 14 seconds  Goal status: NOT MET     LONG TERM GOALS: Target date: 07/16/2024  Patient will improve activities specific balance scale score >=67% as evidence of an improvement self-perception of function and to decrease risk for falls Mae et al., 2022).  Baseline: 57%  02/14/24: 33% 03/07/24: 44% (700/1600); 04/23/24: 770/1600 = 48%  Goal status: ONGOING    2.  Patient will ambulate >=1000 ft in to show community ambulator status to be able to have aerobic endurance to return to fulfilling walking tasks at his job.  Baseline: 8/28: 553' with RW and unable to complete 6 minutes (stopped at 5 minutes)  02/05/24: 558 ft with 2WW; 04/23/24: 686' using RW Goal status: ONGOING    3.  Patient will demonstrate reduced falls risk as evidenced by Dynamic Gait Index (DGI) >19/24 to decrease risk of falling.  Baseline: 13/24 with use of single point cane in RUE   02/19/24: 15/24-  mild for most, moderate for stairs and obstacle over.; 04/23/24: using SPC, 14/24   Goal status: ONGOING     4. Patient will perform in >=1.0 m./sec as evidence of improved mobility, decreased falls risk, and overall functional independence.  Baseline: 8/28: 0.61 m/s  02/12/24: 0.73 m/sec; 04/23/24: with no AD: 0.55 m/sec, SPC: 0.55 m/s Goal status: ONGOING   PLAN:  PT FREQUENCY: 1-2x/week  PT DURATION: 12 weeks  PLANNED INTERVENTIONS: 97164- PT Re-evaluation, 97750-  Physical Performance Testing, 97110-Therapeutic exercises, 97530- Therapeutic activity, 97112- Neuromuscular re-education, 97535- Self Care, 02859- Manual therapy, [id number]- Gait training, [phone]- Orthotic Initial, [id number]- Orthotic/Prosthetic subsequent, [phone]- Canalith repositioning, [id number]- Aquatic Therapy, [id number]- Electrical stimulation (unattended), [phone]- Electrical stimulation (manual), [id number]- Ultrasound, 79439 (1-2 muscles), 20561 (3+ muscles)- Dry Needling, Patient/Family education, Balance training, Stair training, Taping, Joint mobilization, Joint manipulation, Spinal manipulation, Spinal mobilization, Scar mobilization, Vestibular training, DME instructions, Cryotherapy, Moist heat, and Biofeedback.  PLAN FOR NEXT SESSION:   Glut max strengthening. Gait without AD or at least with [facility] to progress LE strength/balance.    Fonda Simpers, PT, DPT Physical Therapist - [facility]  05/27/24, 1:44 PM  "

## 2024-05-29 ENCOUNTER — Encounter: Payer: Self-pay | Admitting: Dietician

## 2024-05-29 ENCOUNTER — Ambulatory Visit

## 2024-05-29 ENCOUNTER — Encounter: Admitting: Dietician

## 2024-05-29 VITALS — Ht 62.0 in | Wt 228.0 lb

## 2024-05-29 DIAGNOSIS — M5459 Other low back pain: Secondary | ICD-10-CM

## 2024-05-29 DIAGNOSIS — R2689 Other abnormalities of gait and mobility: Secondary | ICD-10-CM

## 2024-05-29 DIAGNOSIS — E1121 Type 2 diabetes mellitus with diabetic nephropathy: Secondary | ICD-10-CM | POA: Insufficient documentation

## 2024-05-29 DIAGNOSIS — Z713 Dietary counseling and surveillance: Secondary | ICD-10-CM | POA: Insufficient documentation

## 2024-05-29 DIAGNOSIS — R262 Difficulty in walking, not elsewhere classified: Secondary | ICD-10-CM

## 2024-05-29 DIAGNOSIS — M6281 Muscle weakness (generalized): Secondary | ICD-10-CM

## 2024-05-29 NOTE — Patient Instructions (Addendum)
 Prepare food in the mornings to reheat for meals later (or cook a larger batch to freeze for later use) Work to eat a small meal or healthy snack every 3-5 hours during the day. Eat a light breakfast within 1-2 hours of waking up to wake up body metabolism. Drink more water  during the day, try a few swallows before drinking tea or other drink; try flavoring with a few frozen berries, a slice of lemon or lime or orange, or a cold water  infusion from [place] or [place] (near tea bags in stores). Consider treating yourself to a new drink bottle to help with motivation.

## 2024-05-29 NOTE — Progress Notes (Signed)
 Diabetes Self-Management Education  Visit Type: First/Initial  Appt. Start Time: 0900 Appt. End Time: 1020  05/29/2024  [name], identified by name and date of birth, is a 64 y.o. female with a diagnosis of Diabetes: Type 2.   ASSESSMENT  Height 5' 2 (1.575 m), weight 228 lb (103.4 kg). Body mass index is 41.7 kg/m.   Diabetes Self-Management Education - 05/29/24 0913       Visit Information   Visit Type First/Initial      Initial Visit   Diabetes Type Type 2    Are you currently following a meal plan? No    Are you taking your medications as prescribed? Yes      Health Coping   How would you rate your overall health? Fair      Psychosocial Assessment   Patient Belief/Attitude about Diabetes Denial    What is the hardest part about your diabetes right now, causing you the most concern, or is the most worrisome to you about your diabetes?   Making healty food and beverage choices;Being active    Learning Readiness Ready    How often do you need to have someone help you when you read instructions, pamphlets, or other written materials from your doctor or pharmacy? 1 - Never    What is the last grade level you completed in school? >12; community college      Pre-Education Assessment   Patient understands the diabetes disease and treatment process. Needs Review    Patient understands incorporating nutritional management into lifestyle. Needs Review    Patient undertands incorporating physical activity into lifestyle. Comprehends key points    Patient understands using medications safely. Comprehends key points    Patient understands monitoring blood glucose, interpreting and using results Comprehends key points    Patient understands prevention, detection, and treatment of acute complications. Needs Review    Patient understands prevention, detection, and treatment of chronic complications. Needs Review    Patient understands how to develop strategies to address  psychosocial issues. Needs Review    Patient understands how to develop strategies to promote health/change behavior. Comprehends key points      Complications   Last HgB A1C per patient/outside source 5.4 %   04/24/24   How often do you check your blood sugar? 0 times/day (not testing)   lost meter during move to new residence   Have you had a dilated eye exam in the past 12 months? Yes    Have you had a dental exam in the past 12 months? Yes    Are you checking your feet? Yes    How many days per week are you checking your feet? 7   reports dry skin     Dietary Intake   Breakfast often skips when having appts; occasionally cereal    Lunch cheese and crackers, other snack foods; chicken salad    Dinner cooks maybe 2-3 meals a week; some takeout meal ie cheeseburger;or simple meal ie just broccoli; poor appetite for meal; unmotivated to cook, does not want to have to clean up after, some bug infestation in apt building.    Snack (evening) something sweet (some during night) snack cake(s); ice cream    Beverage(s) lightly sweet tea throughout day; gatorade; occ soda; avoids artificial sweeteners due to history of diarrhea      Activity / Exercise   Activity / Exercise Type Light (walking / raking leaves)   walks dog   How many days per  week do you exercise? 7    How many minutes per day do you exercise? 15    Total minutes per week of exercise 105      Patient Education   Previous Diabetes Education Yes   [facility] diabetes multiple years ago   Healthy Eating Food label reading, portion sizes and measuring food.;Carbohydrate counting;Meal timing in regards to the patients' current diabetes medication.;Meal options for control of blood glucose level and chronic complications.;Other (comment)   eating at regular intervals, quick/ simple options and easy cooking methods to help with motivation; minimizing sugar sweetened beverages   Being Active Role of exercise on diabetes management, blood  pressure control and cardiac health.    Acute complications Other (comment)   preventing hypoglycemia by eating every 3-5 hours during the day     Individualized Goals (developed by patient)   Nutrition Other (comment);General guidelines for healthy choices and portions discussed   eat something every 3-5 hours during the day; drink more water  and less tea and gatorade   Physical Activity Exercise 5-7 days per week;15 minutes per day      Outcomes   Expected Outcomes Demonstrated interest in learning. Expect positive outcomes    Future DMSE 2 months;PRN          Individualized Plan for Diabetes Self-Management Training:   Learning Objective:  Patient will have a greater understanding of diabetes self-management. Patient education plan is to attend individual and/or group sessions per assessed needs and concerns.   Plan:   Patient Instructions  Prepare food in the mornings to reheat for meals later (or cook a larger batch to freeze for later use) Work to eat a small meal or healthy snack every 3-5 hours during the day. Eat a light breakfast within 1-2 hours of waking up to wake up body metabolism. Drink more water  during the day, try a few swallows before drinking tea or other drink; try flavoring with a few frozen berries, a slice of lemon or lime or orange, or a cold water  infusion from [employer] or [employer] (near tea bags in stores). Consider treating yourself to a new drink bottle to help with motivation.  Expected Outcomes:  Demonstrated interest in learning. Expect positive outcomes  Education material provided: Plate planner with food lists; sample menus; snacking handout  If problems or questions, patient to contact team via:  Phone  Future DSME appointment: 2 months, PRN

## 2024-05-29 NOTE — Therapy (Signed)
 " OUTPATIENT PHYSICAL THERAPY THORACOLUMBAR AND BALANCE TREATMENT  Patient Name: Susan [name] MRN: [id number] DOB:1961/01/03, 64 y.o., female Today's Date: 05/29/2024  END OF SESSION:  PT End of Session - 05/29/24 1304     Visit Number 19    Number of Visits 24    Date for Recertification  07/16/24    Authorization Type [employer] Medicare Dual 2025    Authorization - Number of Visits 24    Progress Note Due on Visit 20    PT Start Time 1301    PT Stop Time 1345    PT Time Calculation (min) 44 min    Equipment Utilized During Treatment Gait belt    Activity Tolerance Patient tolerated treatment well    Behavior During Therapy WFL for tasks assessed/performed         Past Medical History:  Diagnosis Date   ADD (attention deficit disorder)    Alcohol abuse    Anemia    Anxiety    Aortic atherosclerosis 02/20/2018   Chest CT Sept 2019   Arthritis    rheumatoid arthritis   Asthma    Bipolar disorder (HCC)    Centrilobular emphysema (HCC)    Chronic diarrhea 07/22/2022   Cocaine use disorder, moderate, in sustained remission (HCC)    Constipation    Degenerative disc disease at L5-S1 level 09/28/2016   See ortho note May 2018   Depression    bipolar, hx of suicide attempt   Diabetes mellitus, type 2 (HCC)    Diverticulitis 2013   DOE (dyspnea on exertion)    Drug use    Family history of adverse reaction to anesthesia    mom-delayed emergence   GERD (gastroesophageal reflux disease)    H/O suicide attempt    slit wrists   Hepatitis C 06/26/2014   treated with Harvoni   Hip pain    History of echocardiogram    a. 04/2019 Echo: EF >65%, nl RV fxn.   History of MRSA infection 2013   HLD (hyperlipidemia)    Hypertension    a. 06/2021 Renal Duplex: ? bilat RAS; b. 06/2021 CTA Abd: No signif RAS.   Hyponatremia    Hypothyroidism    Incisional hernia 11/08/2012   Knee pain    Left carotid bruit    Morbid obesity (HCC)    Multinodular thyroid     OSA (obstructive  sleep apnea)    OSA on CPAP    Osteoporosis    Palpitations    Post-traumatic osteoarthritis of right knee 09/24/2015   Recurrent ventral hernia 11/08/2012   Skin lesion 06/28/2022   Status post total hip replacement, left 07/23/2019   Status post total right knee replacement using cement 05/10/2016   Stroke (HCC) 07/05/2022   right arm,torso and right leg numbness   Tobacco use disorder    Tobacco use disorder, continuous 06/26/2014   Vitamin B12 deficiency    Vitamin D  deficiency 04/09/2015   Past Surgical History:  Procedure Laterality Date   BILATERAL SALPINGOOPHORECTOMY     due to abnormal mass   BREAST BIOPSY Left    neg   BREAST SURGERY Left 20 yrs ago   CESAREAN SECTION     COLONOSCOPY     COLONOSCOPY WITH PROPOFOL  N/A 10/16/2017   Procedure: COLONOSCOPY WITH PROPOFOL ;  Surgeon: Therisa Bi, MD;  Location[facility] ENDOSCOPY;  Service: Gastroenterology;  Laterality: N/A;   COLONOSCOPY WITH PROPOFOL  N/A 02/16/2021   Procedure: COLONOSCOPY WITH PROPOFOL ;  Surgeon: Therisa Bi, MD;  Location:  [facility] ENDOSCOPY;  Service: Gastroenterology;  Laterality: N/A;   FORAMINOTOMY 1 LEVEL Right 10/10/2022   Procedure: RIGHT L4-5 LAMINOFORAMINOTOMY;  Surgeon: Clois Fret, MD;  Location: [facility] ORS;  Service: Neurosurgery;  Laterality: Right;   HERNIA REPAIR  06/2011, July 2014   Ventral wall repair with Physiomesh   HERNIA REPAIR     2nd.vental wall repair   JOINT REPLACEMENT Right    knee   LUMBAR LAMINECTOMY/ DECOMPRESSION WITH MET-RX N/A 10/12/2022   Procedure: LUMBAR LAMINECTOMY/ DECOMPRESSION WITH MET-RX;  Surgeon: Clois Fret, MD;  Location: [facility] ORS;  Service: Neurosurgery;  Laterality: N/A;   TONSILLECTOMY     TOTAL HIP ARTHROPLASTY Left 07/23/2019   Procedure: TOTAL HIP ARTHROPLASTY;  Surgeon: Edie Norleen PARAS, MD;  Location: [facility] ORS;  Service: Orthopedics;  Laterality: Left;   TOTAL KNEE ARTHROPLASTY Right 05/10/2016   Procedure: TOTAL KNEE ARTHROPLASTY;  Surgeon: Norleen PARAS Edie, MD;  Location: [facility] ORS;  Service: Orthopedics;  Laterality: Right;   TUBAL LIGATION     Patient Active Problem List   Diagnosis Date Noted   Tobacco use disorder 05/14/2024   Renal artery stenosis 04/24/2024   Family history of cerebrovascular accident (CVA) 04/24/2024   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   Chronic pain syndrome 02/15/2024   Multiple thyroid  nodules 01/30/2024   Urge incontinence 12/22/2023   Painful and cold lower extremity 12/22/2023   Hemiplegia and hemiparesis following cerebral infarction affecting right dominant side (HCC) 12/04/2023   Benign hypertension with CKD (chronic kidney disease) stage III (HCC) 12/04/2023   Dysuria 11/03/2023   History of stroke 04/17/2023   Right leg weakness 04/17/2023   Neurogenic bladder 02/13/2023   Abnormality of gait and mobility 02/13/2023   Morbid obesity with body mass index (BMI) of 40.0 to 44.9 in adult (HCC) 01/10/2023   Left thyroid  nodule 01/10/2023   Bipolar 1 disorder, depressed, moderate (HCC) 11/29/2022   Normocytic normochromic anemia 10/13/2022   Mass in epidural space 10/12/2022   Synovial cyst of lumbar facet joint 10/10/2022   Chronic radicular lumbar pain 10/10/2022   S/P laminectomy 10/10/2022   Epigastric pain 07/22/2022   Enlarged thyroid  07/22/2022   Type II diabetes mellitus with nephropathy (HCC) 06/16/2021   H/O thyroid  nodule 05/31/2021   History of hepatitis C- treated 2014- 2015 with [facility] gastroenterology and followed.  05/19/2021   Left carotid bruit 04/28/2021   Bipolar disorder, in full remission, most recent episode depressed 02/05/2021   Anxiety 05/28/2020   Bipolar affective disorder in remission 05/28/2020   Bipolar disorder, in full remission, most recent episode mixed 03/06/2020   Need for immunization against influenza 01/27/2020   Screening for lung cancer 01/27/2020   Morbid obesity (HCC) 01/27/2020   Osteoporosis 11/06/2019   Bipolar 1 disorder, mixed, mild (HCC)  10/29/2019   PTSD (post-traumatic stress disorder) 09/27/2019   Bereavement 09/27/2019   Cocaine use disorder, moderate, in sustained remission (HCC) 09/27/2019   Primary osteoarthritis of left hip 06/14/2019   Allergic rhinitis 08/23/2018   Aortic atherosclerosis 02/20/2018   Centrilobular emphysema (HCC) 02/20/2018   Hyperlipidemia 03/28/2017   Degenerative disc disease at L5-S1 level 09/28/2016   GERD (gastroesophageal reflux disease) 12/17/2015   Chronic back pain 10/09/2015   Post-traumatic osteoarthritis of right knee 09/24/2015   Alcohol use disorder, severe, in sustained remission (HCC) 04/13/2015   OSA on CPAP 04/09/2015   Subclinical hyperthyroidism 06/26/2014   Hypertension 06/26/2014   Bipolar I disorder (HCC) 06/26/2014   Lumbar radiculitis 10/03/2013   Diverticulosis 08/24/2013  PCP: Dr. Luke Shade     REFERRING PROVIDER: Dr. Reeves Daisy    REFERRING DIAG: [phone] (ICD-10-CM) - Chronic bilateral low back pain without sciatica  Rationale for Evaluation and Treatment: Rehabilitation  THERAPY DIAG:  Other low back pain  Imbalance  Difficulty in walking, not elsewhere classified  Muscle weakness (generalized)  ONSET DATE: 10/2022  SUBJECTIVE:                                                                                                                                                                                           SUBJECTIVE STATEMENT:    Pt reports she's doing well and has been assisting in helping her son find a car to drive.  Pt notes 6/10 pain upon arrival and states she has not been home since this morning.  She was at an 8/10 and took a pain pill and is down to 6/10 now.   PERTINENT HISTORY:  She has prior history of left sided stroke affecting her right side in February 2024. She also had a L3-L4 lumbar decompression surgery in June 2024 with complications due to a epidural hematoma that resulted in cauda equinda syndrome and  her needing to return to ER for evacuation and she was in the hospital for an additional 8 days following surgery.  She is just moving back to [place] from [place], because she has qualified for housing in [place]. She describes being most concerned with her balance especially when walking over uneven surfaces. She mainly uses a a 2WW when walking to places that she has to drive to because it is easier to transport. She has not had a fall in the past 6 mo, but she is scared of falling especially when walking over uneven surfaces. She does report an improvement in LE function with her now being able to raise her toes and that she was not able to do this after surgery.   PAIN:  Are you having pain? Yes: NPRS scale: 5/10       Pain location: Right ischial tuberosity   Pain description: Dull and achy      Aggravating factors: Walking and performing hip abduction  Relieving factors: Using ice and keep still     PRECAUTIONS: Fall; she is afraid of falling    RED FLAGS: None   WEIGHT BEARING RESTRICTIONS: No  FALLS:  Has patient fallen in last 6 months? No  LIVING ENVIRONMENT: Lives with: lives alone Lives in: House/apartment Stairs: Yes: External: 4 steps; on right going up and there is a ramp going to a house and there is a ramped main entrance leading to lobby. Place  is called [employer]   Has following equipment at home: Single point cane, Environmental Consultant - 2 wheeled, Environmental Consultant - 4 wheeled, shower chair, Grab bars, and Ramped entry  OCCUPATION: Retired   PLOF: Independent  PATIENT GOALS: Pt wants to be able to walk outside using single point cane and to feel more confidence when walking especially over uneven surfaces. She currently uses rollator to walk dog at home and 2WW when walking anywhere where she has to drive.   NEXT MD VISIT: October 2025     OBJECTIVE:  Note: Objective measures were completed at Evaluation unless otherwise noted.  VITALS BP 118/66 HR  60 Sp02   97%  DIAGNOSTIC FINDINGS:   EXAM: MRI LUMBAR SPINE 02/23/2024  IMPRESSION: 1. No compressive central canal stenosis or acute neural impingement. 2. Moderate left lateral recess stenosis at L2-3 without definite neural compression. 3. Postoperative changes at L3-4 without recurrent canal stenosis; left foraminal narrowing that could affect the L3 nerve. Resolution of the previously seen posterior extradural fluid collection. 4. Scoliotic curvature convex to the right with apex at L2-3, similar to the prior exam. 5. Mild edematous endplate changes at L2-3 and L3-4, which may contribute to regional back pain. 6. No anatomic limitation anticipated for spinal cord stimulator placement at T11-12 or T12-L1.   Electronically signed by: Oneil Officer MD 02/26/2024 09:46 AM EDT RP Workstation: [id number]  ////////////////////////////////////////////////////////////////////////////////////////////// CLINICAL DATA:  Low back pain. Cauda equina syndrome suspected. Cyst removal from the spine two days ago. Postoperative urinary incontinence and bilateral leg numbness.   EXAM: MRI LUMBAR SPINE WITHOUT CONTRAST   TECHNIQUE: Multiplanar, multisequence MR imaging of the lumbar spine was performed. No intravenous contrast was administered.   COMPARISON:  05/18/2022   FINDINGS: Segmentation: 5 lumbar type vertebral bodies as numbered previously.   Alignment: Scoliotic curvature convex to the right as seen previously. No significant listhesis.   Vertebrae: Edematous change of the endplates at T10, U88 and T12, likely to relate to regional pain. This is probably degenerative. Lesser vertebral endplate edema noted at L3-4 and L4-5.   Conus medullaris and cauda equina: Conus extends to the L1 level. Conus and cauda equina appear normal.   Paraspinal and other soft tissues: Posterior surgical approach at the L4-5 level from the right-side has an expected appearance.   Disc levels:   At  the surgical level and extending within the spinal canal from the upper L3 level to the L 4 5 disc level, there is a fluid collection which causes marked compression of the thecal sac. This is most severe at the level of the L3-4 disc where the fluid collection within the canal measures 1.8 x 1.5 cm in diameter and markedly flattens the thecal sac anteriorly.   Other than the surgical decompression of the synovial cyst on the right and the postoperative intraspinal fluid collection, there is no change since the preoperative exam, as one might expect.   IMPRESSION: 1. At the surgical level and extending from the upper L3 level to the L4-5 disc level, there is a postoperative fluid collection within the spinal canal which causes marked compression of the thecal sac anteriorly. This is most severe at the level of the L3-4 disc where the fluid collection within the canal measures 1.8 x 1.5 cm in diameter and markedly flattens the thecal sac anteriorly.     Electronically Signed   By: Oneil Officer M.D.   On: 10/12/2022 16:20  PATIENT SURVEYS:  ABC scale: The Activities-Specific Balance Confidence (ABC)  Scale 0% 10 20 30  40 50 60 70 80 90 100% No confidence<->completely confident  How confident are you that you will not lose your balance or become unsteady when you . . .   Date tested 01/01/24  Walk around the house 80%  2. Walk up or down stairs 80%  3. Bend over and pick up a slipper from in front of a closet floor 60%  4. Reach for a small can off a shelf at eye level 90%  5. Stand on tip toes and reach for something above your head 0%  6. Stand on a chair and reach for something 40%  7. Sweep the floor 50%  8. Walk outside the house to a car parked in the driveway 90%  9. Get into or out of a car 80%  10. Walk across a parking lot to the mall 80%  11. Walk up or down a ramp 80%  12. Walk in a crowded mall where people rapidly walk past you 60%  13. Are bumped into by people  as you walk through the mall 40%  14. Step onto or off of an escalator while you are holding onto the railing 50%  15. Step onto or off an escalator while holding onto parcels such that you cannot hold onto the railing 30%  16. Walk outside on icy sidewalks 0%  Total: #/16  57% (910/1600)       COGNITION: Overall cognitive status: Within functional limits for tasks assessed     SENSATION: Light touch: Impaired electricity down her RLE   MUSCLE LENGTH: Hamstrings: Not tested   Debby test: Not tested    POSTURE: rounded shoulders  PALPATION: Not performed    LUMBAR ROM:   AROM eval  Flexion 80%  Extension 100%  Right lateral flexion 100%  Left lateral flexion 100%  Right rotation 100%  Left rotation 100%   (Blank rows = not tested)   LOWER EXTREMITY MMT:    MMT Right eval Left eval  Hip flexion 4- 4  Knee flexion 4 4  Knee extension 4 4  Ankle dorsiflexion 4 4   (Blank rows = not tested)    FUNCTIONAL TESTS:  5 times sit to stand: 14 sec , 03/07/24:11 sec  30 seconds chair stand test: 10?30/25:  11 reps, 13 reps    Timed up and go (TUG): 15.38 sec with use of single point cane   6 minute walk test: 553 ft    10 meter walk test: 0.61 m/sec                 DGI:  13/26                       <=19/24  predictive of falls in the elderly    GAIT: Distance walked: 30 ft    Assistive device utilized: Walker - 2 wheeled Level of assistance: Modified independence Comments: Significant use of BUE support with decrease step length bilaterally.    TREATMENT DATE: 05/29/2024   TherAct:  The patient completed 10 minutes at level(s) 1-5 on the NuStep using both BUE/BLE reciprocal movements to promote strength, endurance, and cardiorespiratory fitness. PT increased the resistance level and monitored the patient's response to the intervention throughout. The patient required min cueing for technique and supervision level of assistance.   Ambulation around the gym, x4  laps, utilizing cane and CGA with gait belt donned  STS with thinner airex pad under feet  for added challenge, 2x10  Ambulation up steps, B UE for support, alternating, x10  Seated rest break utilized and vitals monitored Lateral step up onto 6 step, x10 from each side  Seated rest break utilized and vitals monitored  Standing calf raise attempts, x10 but unable to get full ROM  Seated ankle plantarflexion with RTC, 2x10 each LE Seated calf raises while holding brown bolster on knees for increased resistance, 2x10  Ambulation around the gym, x4 laps, utilizing cane and CGA with gait belt donned    Neuro:  Static NBOS on Airex pad, 30 sec bouts Static NBOS on Airex pad, eyes closed, 30 sec bouts Static staggered stance on Airex pad, 30 sec bouts Static staggered stance on Airex pad, eyes closed, 30 sec bouts    PATIENT EDUCATION:  Education details: Form and technique for correct performance of exercise and explanation about cut-offs for functional tests.  Person educated: Patient Education method: Explanation, Demonstration, Verbal cues, and Handouts Education comprehension: verbalized understanding, returned demonstration, and verbal cues required  HOME EXERCISE PROGRAM: Access Code: [email or web address] URL: [email or web address] Date: 02/29/2024 Prepared by: Toribio Servant  Program Notes Toe to heel on right foot with mirror in front of you 3 sets  x 20 reps 5-7 days per week    Exercises - Seated Slump Neural Tensioner  - 1 x daily - 7 x weekly - 1 sets - 10 reps - Seated Hamstring Stretch  - 1 x daily - 7 x weekly - 2-3 reps - 60 sec hold - Seated Hamstring Stretch (Mirrored)  - 1 x daily - 7 x weekly - 2-3 reps - 60 sec hold - Alternating Step Taps with Counter Support (Mirrored)  - 5-7 x weekly - 3 sets - 20 reps - Sit to Stand  - 3-4 x weekly - 3 sets - 10 reps - Standing Ankle Dorsiflexion with Table Support  - 3-4 x weekly - 3 sets - 10 reps - Standing  March with Counter Support (Mirrored)  - 3-4 x weekly - 3 sets - 10 reps - Standing Knee Flexion AROM with Chair Support (Mirrored)  - 3-4 x weekly - 3 sets - 10 reps - Standing Hip Abduction with Counter Support (Mirrored)  - 3-4 x weekly - 3 sets - 10 reps - Long Sitting Ankle Plantar Flexion with Resistance (Mirrored)  - 3-4 x weekly - 3 sets - 15 reps  CLINICAL IMPRESSION:     Patient responded well to the exercises today.  Patient noted that she feels better when she warms up on the NuStep so that was continued today.  Patient introduced to narrow based exercises in order to challenge balance with eyes open and eyes closed.  Patient tends to have reduced balance when standing on the right leg as noted in current gait pattern as well.  Patient has limited propulsion due to lack of calf strength currently and will continue to be primary focus moving forward during gait.   Pt will continue to benefit from skilled therapy to address remaining deficits in order to improve overall QoL and return to PLOF.      Note: Portions of this document were prepared using Dragon voice recognition software and although reviewed may contain unintentional dictation errors in syntax, grammar, or spelling.   OBJECTIVE IMPAIRMENTS: Abnormal gait, decreased endurance, decreased ROM, decreased strength, impaired flexibility, impaired sensation, obesity, and pain.   ACTIVITY LIMITATIONS: carrying, lifting, bending, standing, squatting, stairs, dressing, hygiene/grooming, and locomotion level  PARTICIPATION LIMITATIONS: meal prep,  cleaning, laundry, shopping, community activity, and yard work  PERSONAL FACTORS: Fitness, Past/current experiences, Time since onset of injury/illness/exacerbation, and 3+ comorbidities: h/o stroke with right sided deficits, L3-L5 decompression, HLD, HTN are also affecting patient's functional outcome.   REHAB POTENTIAL: Fair multiple co-morbidities and chronicity of condition  CLINICAL  DECISION MAKING: Evolving/moderate complexity  EVALUATION COMPLEXITY: Moderate   GOALS: Goals reviewed with patient? No  SHORT TERM GOALS: Target date: 06/04/2024  Patient will demonstrate undestanding of home exercise plan by performing exercises correctly with evidence of good carry over with min to no verbal or tactile cues .   Baseline: NT  01/16/24: Performing Independently  Goal status: ACHIEVED     2.   Pt will increase by at least 17m (159ft) in order to demonstrate clinically significant improvement in cardiopulmonary endurance and community ambulation Baseline: 8/28: 553' with RW and unable to complete 6 minutes (stopped at 5 minutes)   Goal status: NOT MET     3.  Patient will show decreased risk of falling as evidence by decrease in self-selected gait speed by >= 0.12 m/sec.  Baseline: 8/28: 0.61 m/s Goal status: NOT MET    4.  Pt will decrease 5TSTS by at least 3 seconds to demonstrate a minimal clinically significant improvement in LE strength.  Baseline: 14 seconds  Goal status: NOT MET     LONG TERM GOALS: Target date: 07/16/2024  Patient will improve activities specific balance scale score >=67% as evidence of an improvement self-perception of function and to decrease risk for falls Mae et al., 2022).  Baseline: 57%  02/14/24: 33% 03/07/24: 44% (700/1600); 04/23/24: 770/1600 = 48%  Goal status: ONGOING    2.  Patient will ambulate >=1000 ft in to show community ambulator status to be able to have aerobic endurance to return to fulfilling walking tasks at his job.  Baseline: 8/28: 553' with RW and unable to complete 6 minutes (stopped at 5 minutes)  02/05/24: 558 ft with 2WW; 04/23/24: 686' using RW Goal status: ONGOING    3.  Patient will demonstrate reduced falls risk as evidenced by Dynamic Gait Index (DGI) >19/24 to decrease risk of falling.  Baseline: 13/24 with use of single point cane in RUE   02/19/24: 15/24-  mild for most, moderate for stairs  and obstacle over.; 04/23/24: using SPC, 14/24   Goal status: ONGOING     4. Patient will perform in >=1.0 m./sec as evidence of improved mobility, decreased falls risk, and overall functional independence.  Baseline: 8/28: 0.61 m/s  02/12/24: 0.73 m/sec; 04/23/24: with no AD: 0.55 m/sec, SPC: 0.55 m/s Goal status: ONGOING   PLAN:  PT FREQUENCY: 1-2x/week  PT DURATION: 12 weeks  PLANNED INTERVENTIONS: [id number]- PT Re-evaluation, 97750- Physical Performance Testing, 97110-Therapeutic exercises, 97530- Therapeutic activity, 97112- Neuromuscular re-education, 97535- Self Care, 02859- Manual therapy, [id number]- Gait training, [phone]- Orthotic Initial, [phone]- Orthotic/Prosthetic subsequent, [phone]- Canalith repositioning, [id number]- Aquatic Therapy, [id number]- Electrical stimulation (unattended), [phone]- Electrical stimulation (manual), [id number]- Ultrasound, 79439 (1-2 muscles), 20561 (3+ muscles)- Dry Needling, Patient/Family education, Balance training, Stair training, Taping, Joint mobilization, Joint manipulation, Spinal manipulation, Spinal mobilization, Scar mobilization, Vestibular training, DME instructions, Cryotherapy, Moist heat, and Biofeedback.  PLAN FOR NEXT SESSION:    Glut max strengthening. Gait without AD or at least with [facility] to progress LE strength/balance.     Fonda Simpers, PT, DPT Physical Therapist - [facility]  05/29/24, 2:27 PM  "

## 2024-05-30 ENCOUNTER — Other Ambulatory Visit: Payer: Self-pay

## 2024-05-30 ENCOUNTER — Encounter: Payer: Self-pay | Admitting: Neurosurgery

## 2024-05-30 ENCOUNTER — Telehealth: Payer: Self-pay

## 2024-05-30 ENCOUNTER — Ambulatory Visit: Admitting: Neurosurgery

## 2024-05-30 VITALS — BP 146/84 | Ht 62.0 in | Wt 228.0 lb

## 2024-05-30 DIAGNOSIS — M961 Postlaminectomy syndrome, not elsewhere classified: Secondary | ICD-10-CM

## 2024-05-30 DIAGNOSIS — Z01818 Encounter for other preprocedural examination: Secondary | ICD-10-CM

## 2024-05-30 NOTE — Telephone Encounter (Signed)
 We received a request for clearance for procedure from [facility] at [facility] via fax.  I sent a copy to Dr. Kalpana Bair's folder on the S drive and placed a copy on Destiny Knight, CMA's, keyboard, as she was with a patient.

## 2024-05-30 NOTE — Progress Notes (Signed)
 "  REFERRING PHYSICIAN:  Abbey Bruckner, Md [address] [place],  [place] [address]   DOS: 10/12/22 L3-5 hematoma evacuation DOS:   10/10/22 L3-5 lumbar decompression   HISTORY OF PRESENT ILLNESS: Susan [name] is status post L3-5 lumbar decompression with evacuation of epidural hematoma.   She had an excellent response to SCS trial.  She had 70% improvement in symptoms.  She was able to do more of her daily tasks with less pain.  Current Outpatient Medications  Medication Instructions   acetaminophen  (TYLENOL ) 1,000 mg, 2 times daily   aspirin  EC 81 mg, Oral, Daily, *Swallow whole*   carvedilol  (COREG ) 12.5 mg, Oral, 2 times daily with meals   clotrimazole  (LOTRIMIN ) 1 % cream 1 application , 2 times daily PRN   cyclobenzaprine  (FLEXERIL ) 2.5-5 mg, 2 times daily PRN   DULoxetine  (CYMBALTA ) 20 mg, Oral, Daily, *Take with 30 mg capsule*   DULoxetine  (CYMBALTA ) 30 mg, Oral, Daily, *Take with 20 mg capsule.*   estradiol  (ESTRACE ) 0.01 % CREA vaginal cream Apply 1 gram daily per vagina for 2 weeks and then three times weekly   gabapentin  (NEURONTIN ) 600 mg, Oral, 2 times daily   Gemtesa  75 mg, Oral, Daily   HYDROcodone -acetaminophen  (NORCO/VICODIN) 5-325 MG tablet 1 tablet, Daily PRN   lurasidone  (LATUDA ) 20 mg, Oral, Daily with supper   naloxone (NARCAN) nasal spray 4 mg/0.1 mL Place into the nose.   rosuvastatin  (CRESTOR ) 20 mg, Oral, Daily   Semaglutide  (OZEMPIC , 2 MG/DOSE, [place]) 2 mg, Every 7 days   telmisartan  (MICARDIS ) 80 mg, Oral, Daily   traZODone  (DESYREL ) 50 mg, Oral, At bedtime PRN, for sleep   vitamin B-12 (CYANOCOBALAMIN ) 100 mcg, Oral, Daily    Allergies[1]  Social History   Socioeconomic History   Marital status: Widowed    Spouse name: Not on file   Number of children: 3   Years of education: Not on file   Highest education level: Some college, no degree  Occupational History   Occupation: disabled  Tobacco Use   Smoking status: Every Day    Types:  E-cigarettes   Smokeless tobacco: Never   Tobacco comments:    Vapes 2 daily  Vaping Use   Vaping status: Every Day   Substances: Nicotine   Substance and Sexual Activity   Alcohol use: No    Alcohol/week: 0.0 standard drinks of 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   Drug use: No    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   Sexual activity: Not Currently  Other Topics Concern   Not on file  Social History Narrative   Not on file   Social Drivers of Health   Tobacco Use: High Risk (05/30/2024)   Patient History    Smoking Tobacco Use: Every Day    Smokeless Tobacco Use: Never    Passive Exposure: Not on file  Financial Resource Strain: Low Risk (04/01/2024)   Overall Financial Resource Strain (CARDIA)    Difficulty of Paying Living Expenses: Not hard at all  Food Insecurity: No Food Insecurity (04/01/2024)   Epic    Worried About Radiation Protection Practitioner of Food in the Last Year: Never true    Ran Out of Food in the Last Year: Never true  Transportation Needs: No Transportation Needs (04/01/2024)   Epic    Lack of Transportation (Medical): No    Lack of Transportation (Non-Medical): No  Physical Activity: Inactive (04/01/2024)   Exercise Vital Sign    Days of  Exercise per Week: 0 days    Minutes of Exercise per Session: 0 min  Stress: No Stress Concern Present (04/01/2024)   Susan [name]    Feeling of Stress: Only a little  Social Connections: Moderately Isolated (04/01/2024)   Social Connection and Isolation Panel    Frequency of Communication with Friends and Family: More than three times a week    Frequency of Social Gatherings with Friends and Family: Twice a week    Attends Religious Services: Never    Database Administrator or Organizations: Yes    Attends Engineer, Structural: More than 4 times per year    Marital Status: Widowed  Intimate Partner Violence: Not At Risk (04/01/2024)    Epic    Fear of Current or Ex-Partner: No    Emotionally Abused: No    Physically Abused: No    Sexually Abused: No  Depression (PHQ2-9): Low Risk (05/29/2024)   Depression (PHQ2-9)    PHQ-2 Score: 1  Recent Concern: Depression (PHQ2-9) - Medium Risk (04/24/2024)   Depression (PHQ2-9)    PHQ-2 Score: 6  Alcohol Screen: Low Risk (04/01/2024)   Alcohol Screen    Last Alcohol Screening Score (AUDIT): 0  Housing: Low Risk (04/01/2024)   Epic    Unable to Pay for Housing in the Last Year: No    Number of Times Moved in the Last Year: 0    Homeless in the Last Year: No  Utilities: Not At Risk (04/01/2024)   Epic    Threatened with loss of utilities: No  Health Literacy: Adequate Health Literacy (04/01/2024)   B1300 Health Literacy    Frequency of need for help with medical instructions: Never    Family History  Problem Relation Age of Onset   Arthritis Mother    Asthma Mother    Mental illness Mother    Thyroid  disease Mother    COPD Mother    Heart disease Mother    Congestive Heart Failure Mother    Alcohol abuse Mother    Eating disorder Mother    Bipolar disorder Mother    Alcohol abuse Father    Heart attack Father    Alcohol abuse Sister    Drug abuse Sister    Mental illness Sister    Mental illness Sister    Fibromyalgia Sister    Obesity Sister    Pneumonia Sister    Depression Sister    Mental illness Sister    Alcohol abuse Sister    Drug abuse Sister    Arthritis Brother    Mental illness Brother    Cancer Brother        non-hodkins lymphoma   Drug abuse Daughter    Drug abuse Son    Alcohol abuse Son    Drug abuse Son    Alcohol abuse Son    Diabetes Neg Hx    Stroke Neg Hx    Breast cancer Neg Hx    Thyroid  cancer Neg Hx          PHYSICAL EXAMINATION:  Vitals:   05/30/24 1021  BP: (!) 146/84   4+-5/5 strength in BLE  Walking with walker   ROS (Neurologic):  Negative except as noted above  IMAGING: Nothing new to review.    T-spine MRI reviewed.  ASSESSMENT/PLAN:  Susan [name] continues to have issues.  She has postlaminectomy syndrome.  She had an excellent response to stimulator trial.  I  think she is an excellent candidate for permanent implantation.    I discussed the planned procedure at length with the patient, including the risks, benefits, alternatives, and indications. The risks discussed include but are not limited to bleeding, infection, need for reoperation, spinal fluid leak, stroke, vision loss, anesthetic complication, coma, paralysis, and even death. I also described in detail that improvement was not guaranteed.  The patient expressed understanding of these risks, and asked that we proceed with surgery. I described the surgery in layman's terms, and gave ample opportunity for questions, which were answered to the best of my ability.  I spent a total of 20 minutes in this patient's care today. This time was spent reviewing pertinent records including imaging studies, obtaining and confirming history, performing a directed evaluation, formulating and discussing my recommendations, and documenting the visit within the medical record.    Susan Daisy MD Department of neurosurgery    [1]  Allergies Allergen Reactions   Amlodipine      Lower leg edema    Wellbutrin  [Bupropion ]     Patient reports made  deathly sick  years ago at appointment 03/18/20.   Furosemide Other (See Comments)    Electrolyte imbalance  Other Reaction(s): Other (See Comments)  Electrolyte imbalance Electrolyte imbalance Electrolyte imbalance    Electrolyte imbalance   Hydrochlorothiazide  Other (See Comments)    Electrolyte imbalance  Other Reaction(s): Dizziness, Other (See Comments)   "

## 2024-05-30 NOTE — Telephone Encounter (Signed)
 SABRA

## 2024-05-30 NOTE — Patient Instructions (Signed)
 Please see below for information in regards to your upcoming surgery:   Planned surgery: Thoracic laminectomy for spinal cord stimulator placement ([employer])   Surgery date: 06/19/24 at [facility] (Medical Mall: [address] [place], [place] [address]) - you will find out your arrival time the business day before your surgery.   Pre-op appointment at [facility] Pre-admit Testing: you will receive a call with a date/time for this appointment. If you are scheduled for an in person appointment, Pre-admit Testing is located on the first floor of the [facility] building, [address], [address]. During this appointment, they will advise you which medications you can take the morning of surgery, and which medications you will need to hold for surgery. Labs (such as blood work, EKG) may be done at your pre-op appointment. You are not required to fast for these labs. Should you need to change your pre-op appointment, please call Pre-admit testing at [phone].   Please bring your medication bottles or an up to date medication list to your pre-admit testing appointment (regardless of whether we have a list in your chart).    Blood thinners:   Aspirin  81mg : OK to stay on aspirin  81mg     Diabetes/heart failure/kidney disease/weight loss medications that require an extended hold: Per anesthesia guidelines (due to the increased risk of aspiration caused by delayed gastric emptying):  Semaglutide  (Ozempic ) injections: hold for 7 days prior to surgery    Surgical clearance: we will send a clearance form to Luke Shade, MD. They may wish to see you in their office prior to signing the clearance form. If so, they may call you to schedule an appointment.     Common restrictions after spine surgery: No bending, lifting, or twisting (BLT). Avoid lifting objects heavier than 10 pounds for the first 6 weeks after surgery. Where possible, avoid  household activities that involve lifting, bending, reaching, pushing, or pulling such as laundry, vacuuming, grocery shopping, and childcare. Try to arrange for help from friends and family for these activities while you heal. Do not drive while taking prescription pain medication. Weeks 6 through 12 after surgery: avoid lifting more than 25 pounds.     How to contact us :  If you have any questions/concerns before or after surgery, you can reach us  at [phone], or you can send a mychart message. We can be reached by phone or mychart 8am-4pm, Monday-Friday.  *Please note: Calls after 4pm are forwarded to a third party answering service. Mychart messages are not routinely monitored during evenings, weekends, and holidays. Please call our office to contact the answering service for urgent concerns during non-business hours.   If you have FMLA/disability paperwork, please drop it off or fax it to [phone]   Appointments/FMLA & disability paperwork: Reche Hait, & Nichole Registered Nurse/Surgery scheduler: Ainsley Deakins, RN & Katie, RN Certified Medical Assistants: Don, CMA, Elenor, CMA, Damien, CMA, & Auston, [place] Physician Assistants: Lyle Decamp, PA-C, Edsel Goods, PA-C & Glade Boys, PA-C Surgeons: Penne Sharps, MD & Reeves Daisy, MD   [facility] PREADMIT TESTING VISIT and SURGERY INFORMATION SHEET   Now that surgery has been scheduled you can anticipate several phone calls from [facility] services. A pharmacy technician will call you to verify your current list of medications taken at home.               The Pre-Service Center will call to verify your insurance information and to give you billing estimates and  information.             The Preadmit Testing Office will be calling to schedule a visit to obtain information for the anesthesia team and provide instructions on preparation for surgery.  What can you expect for the Preadmit Testing  Visit: Appointments may be scheduled in-person or by telephone.  If a telephone visit is scheduled, you may be asked to come into the office to have lab tests or other studies performed.   This visit will not be completed any greater than 14 days prior to your surgery.  If your surgery has been scheduled for a future date, please do not be alarmed if we have not contacted you to schedule an appointment more than a month prior to the surgery date.    Please be prepared to provide the following information during this appointment:            -Personal medical history                                               -Medication and allergy list            -Any history of problems with anesthesia              -Recent lab work or diagnostic studies            -Please notify us  of any needs we should be aware of to provide the best care possible           -You will be provided with instructions on how to prepare for your surgery.    On The Day of Surgery:  You must have a driver to take you home after surgery, you will be asked not to drive for 24 hours following surgery.  Taxi, Gisele and non-medical transport will not be acceptable means of transportation unless you have a responsible individual who will be traveling with you.  Visitors in the surgical area:   2 people will be able to visit you in your room once your preparation for surgery has been completed. During surgery, your visitors will be asked to wait in the Surgery Waiting Area.  It is not a requirement for them to stay, if they prefer to leave and come back.  Your visitor(s) will be given an update once the surgery has been completed.  No visitors are allowed in the initial recovery room to respect patient privacy and safety.  Once you are more awake and transfer to the secondary recovery area, or are transferred to an inpatient room, visitors will again be able to see you.  To respect and protect your privacy: We will ask on the day of  surgery who your driver will be and what the contact number for that individual will be. We will ask if it is okay to share information with this individual, or if there is an alternative individual that we, or the surgeon, should contact to provide updates and information. If family or friends come to the surgical information desk requesting information about you, who you have not listed with us , no information will be given.   It may be helpful to designate someone as the main contact who will be responsible for updating your other friends and family.    PREADMIT TESTING OFFICE: [phone] SAME DAY SURGERY: [phone] We look forward to  caring for you before and throughout the process of your surgery.

## 2024-05-30 NOTE — Telephone Encounter (Signed)
 Sent a new clearance form to [facility] per Dr Graylon recommendation. I also updated the patient.

## 2024-05-30 NOTE — Telephone Encounter (Signed)
 Surgical Clearance form has been faxed to Neurosurgery for Dr Reeves Nine with Dr Graylon recommendations attached.

## 2024-05-30 NOTE — Telephone Encounter (Signed)
 Reviewed preop clearance form.  Given patient's comorbidities, general anesthesia for upcoming procedure recommend cardiac clearance prior to spinal cord stimulator placement if deemed appropriate by neurosurgeon and preop anesthesia evaluation.   Please fax the form to Dr. Elliot office. She is established with cardiologist, last visit was on 01/27/24 with Dr. Darliss.    Thank you,  Luke Shade, MD

## 2024-05-31 ENCOUNTER — Telehealth: Payer: Self-pay

## 2024-05-31 ENCOUNTER — Telehealth (HOSPITAL_BASED_OUTPATIENT_CLINIC_OR_DEPARTMENT_OTHER): Payer: Self-pay | Admitting: *Deleted

## 2024-05-31 NOTE — Telephone Encounter (Signed)
"  ° °  Pre-operative Risk Assessment    Patient Name: Susan [name]  DOB: February 17, 1961 MRN: [id number]   Date of last office visit: 02/06/24 DR. AGBOR-ETANG Date of next office visit: NONE   Request for Surgical Clearance    Procedure:  THORACIC LAMINECTOMY FOR SPINAL CORD STIMULATOR PLACEMENT  Date of Surgery:  Clearance 06/19/24                                Surgeon:  DR. REEVES DAISY Surgeon's Group or Practice Name:  [facility] Phone number:  [phone] Fax number:  [phone]   Type of Clearance Requested:   - Medical ; PER FORM OK TO STAY ON ASA 81 MG  DOES ALSO NOTE HOLD OZEMPIC  x 7 DAYS PRIOR   Type of Anesthesia:  General    Additional requests/questions:    [name]   05/31/2024, 8:36 AM   "

## 2024-05-31 NOTE — Telephone Encounter (Signed)
"  °  Patient Consent for Virtual Visit        Susan [name] has provided verbal consent on 05/31/2024 for a virtual visit (video or telephone).   CONSENT FOR VIRTUAL VISIT FOR:  Susan [name]  By participating in this virtual visit I agree to the following:  I hereby voluntarily request, consent and authorize [facility] and its employed or contracted physicians, physician assistants, nurse practitioners or other licensed health care professionals (the Practitioner), to provide me with telemedicine health care services (the Services) as deemed necessary by the treating Practitioner. I acknowledge and consent to receive the Services by the Practitioner via telemedicine. I understand that the telemedicine visit will involve communicating with the Practitioner through live audiovisual communication technology and the disclosure of certain medical information by electronic transmission. I acknowledge that I have been given the opportunity to request an in-person assessment or other available alternative prior to the telemedicine visit and am voluntarily participating in the telemedicine visit.  I understand that I have the right to withhold or withdraw my consent to the use of telemedicine in the course of my care at any time, without affecting my right to future care or treatment, and that the Practitioner or I may terminate the telemedicine visit at any time. I understand that I have the right to inspect all information obtained and/or recorded in the course of the telemedicine visit and may receive copies of available information for a reasonable fee.  I understand that some of the potential risks of receiving the Services via telemedicine include:  Delay or interruption in medical evaluation due to technological equipment failure or disruption; Information transmitted may not be sufficient (e.g. poor resolution of images) to allow for appropriate medical decision making by the  Practitioner; and/or  In rare instances, security protocols could fail, causing a breach of personal health information.  Furthermore, I acknowledge that it is my responsibility to provide information about my medical history, conditions and care that is complete and accurate to the best of my ability. I acknowledge that Practitioner's advice, recommendations, and/or decision may be based on factors not within their control, such as incomplete or inaccurate data provided by me or distortions of diagnostic images or specimens that may result from electronic transmissions. I understand that the practice of medicine is not an exact science and that Practitioner makes no warranties or guarantees regarding treatment outcomes. I acknowledge that a copy of this consent can be made available to me via my patient portal [facility] MyChart), or I can request a printed copy by calling the office of [facility].    I understand that my insurance will be billed for this visit.   I have read or had this consent read to me. I understand the contents of this consent, which adequately explains the benefits and risks of the Services being provided via telemedicine.  I have been provided ample opportunity to ask questions regarding this consent and the Services and have had my questions answered to my satisfaction. I give my informed consent for the services to be provided through the use of telemedicine in my medical care    "

## 2024-05-31 NOTE — Telephone Encounter (Signed)
 Preop tele appt now scheduled, med rec and consent done.

## 2024-05-31 NOTE — Telephone Encounter (Signed)
" ° °  Name: Susan [name]  DOB: 07/26/1960  MRN: [id number]  Primary Cardiologist: Redell Cave, MD   Preoperative team, please contact this patient and set up a phone call appointment for further preoperative risk assessment. Please obtain consent and complete medication review. Thank you for your help.  I confirm that guidance regarding antiplatelet and oral anticoagulation therapy has been completed and, if necessary, noted below.  Per requesting team- OK to remain on aspirin . Hold ozempic  for 7 days  I also confirmed the patient resides in the state of [place] . As per [facility] Medical Board telemedicine laws, the patient must reside in the state in which the provider is licensed.   Rollo FABIENE Louder, PA-C 05/31/2024, 8:53 AM [facility]    "

## 2024-06-04 ENCOUNTER — Ambulatory Visit

## 2024-06-05 ENCOUNTER — Telehealth: Payer: Self-pay | Admitting: Neurosurgery

## 2024-06-05 NOTE — Telephone Encounter (Signed)
 Called patient to change appointment to the [facility] office but she would rather stay in [place]. She would like a call back 06/07/24 after 3pm to reschedule appt on 3/26

## 2024-06-06 ENCOUNTER — Ambulatory Visit

## 2024-06-10 ENCOUNTER — Ambulatory Visit

## 2024-06-10 DIAGNOSIS — Z01810 Encounter for preprocedural cardiovascular examination: Secondary | ICD-10-CM

## 2024-06-10 NOTE — Progress Notes (Signed)
 "   Virtual Visit via Telephone Note   Because of Collins L Gharibian co-morbid illnesses, she is at least at moderate risk for complications without adequate follow up.  This format is felt to be most appropriate for this patient at this time.  Due to technical limitations with video connection (technology), today's appointment will be conducted as an audio only telehealth visit, and [employer] verbally agreed to proceed in this manner.   All issues noted in this document were discussed and addressed.  No physical exam could be performed with this format.  Evaluation Performed:  Preoperative cardiovascular risk assessment _____________   Date:  06/10/2024   Patient ID:  Susan [name], DOB 07/13/1960, MRN [id number] Patient Location:  Home Provider location:   Office  Primary Care Provider:  Bair, Kalpana, MD Primary Cardiologist:  Redell Cave, MD  Chief Complaint / Patient Profile   64 y.o. y/o female with a h/o  HTN, HLD, shortness of breath, OSA, who is pending Thoracic laminectomy  and presents today for telephonic preoperative cardiovascular risk assessment.  History of Present Illness    Susan [name] is a 64 y.o. female who presents via audio/video conferencing for a telehealth visit today.  Pt was last seen in cardiology clinic on 02/06/24 by Dr. Budd- Daleen .  At that time [facility] was doing well .  The patient is now pending procedure as outlined above. Since her last visit, she continues to be stable from a cardiac standpoint.  Today she denies chest pain, shortness of breath, lower extremity edema, fatigue, palpitations, melena, hematuria, hemoptysis, diaphoresis, weakness, presyncope, syncope, orthopnea, and PND.   Past Medical History    Past Medical History:  Diagnosis Date   ADD (attention deficit disorder)    Alcohol abuse    Anemia    Anxiety    Aortic atherosclerosis 02/20/2018   Chest CT Sept 2019   Arthritis    rheumatoid arthritis    Asthma    Bipolar disorder (HCC)    Centrilobular emphysema (HCC)    Chronic diarrhea 07/22/2022   Cocaine use disorder, moderate, in sustained remission (HCC)    Constipation    Degenerative disc disease at L5-S1 level 09/28/2016   See ortho note May 2018   Depression    bipolar, hx of suicide attempt   Diabetes mellitus, type 2 (HCC)    Diverticulitis 2013   DOE (dyspnea on exertion)    Drug use    Family history of adverse reaction to anesthesia    mom-delayed emergence   GERD (gastroesophageal reflux disease)    H/O suicide attempt    slit wrists   Hepatitis C 06/26/2014   treated with Harvoni   Hip pain    History of echocardiogram    a. 04/2019 Echo: EF >65%, nl RV fxn.   History of MRSA infection 2013   HLD (hyperlipidemia)    Hypertension    a. 06/2021 Renal Duplex: ? bilat RAS; b. 06/2021 CTA Abd: No signif RAS.   Hyponatremia    Hypothyroidism    Incisional hernia 11/08/2012   Knee pain    Left carotid bruit    Morbid obesity (HCC)    Multinodular thyroid     OSA (obstructive sleep apnea)    OSA on CPAP    Osteoporosis    Palpitations    Post-traumatic osteoarthritis of right knee 09/24/2015   Recurrent ventral hernia 11/08/2012   Skin lesion 06/28/2022   Status post total hip replacement,  left 07/23/2019   Status post total right knee replacement using cement 05/10/2016   Stroke (HCC) 07/05/2022   right arm,torso and right leg numbness   Tobacco use disorder    Tobacco use disorder, continuous 06/26/2014   Vitamin B12 deficiency    Vitamin D  deficiency 04/09/2015   Past Surgical History:  Procedure Laterality Date   BILATERAL SALPINGOOPHORECTOMY     due to abnormal mass   BREAST BIOPSY Left    neg   BREAST SURGERY Left 20 yrs ago   CESAREAN SECTION     COLONOSCOPY     COLONOSCOPY WITH PROPOFOL  N/A 10/16/2017   Procedure: COLONOSCOPY WITH PROPOFOL ;  Surgeon: Therisa Bi, MD;  Location: [facility] ENDOSCOPY;  Service: Gastroenterology;  Laterality: N/A;    COLONOSCOPY WITH PROPOFOL  N/A 02/16/2021   Procedure: COLONOSCOPY WITH PROPOFOL ;  Surgeon: Therisa Bi, MD;  Location: [facility] ENDOSCOPY;  Service: Gastroenterology;  Laterality: N/A;   FORAMINOTOMY 1 LEVEL Right 10/10/2022   Procedure: RIGHT L4-5 LAMINOFORAMINOTOMY;  Surgeon: Clois Fret, MD;  Location: [facility] ORS;  Service: Neurosurgery;  Laterality: Right;   HERNIA REPAIR  06/2011, July 2014   Ventral wall repair with Physiomesh   HERNIA REPAIR     2nd.vental wall repair   JOINT REPLACEMENT Right    knee   LUMBAR LAMINECTOMY/ DECOMPRESSION WITH MET-RX N/A 10/12/2022   Procedure: LUMBAR LAMINECTOMY/ DECOMPRESSION WITH MET-RX;  Surgeon: Clois Fret, MD;  Location: [facility] ORS;  Service: Neurosurgery;  Laterality: N/A;   TONSILLECTOMY     TOTAL HIP ARTHROPLASTY Left 07/23/2019   Procedure: TOTAL HIP ARTHROPLASTY;  Surgeon: Edie Norleen PARAS, MD;  Location: [facility] ORS;  Service: Orthopedics;  Laterality: Left;   TOTAL KNEE ARTHROPLASTY Right 05/10/2016   Procedure: TOTAL KNEE ARTHROPLASTY;  Surgeon: Norleen PARAS Edie, MD;  Location: [facility] ORS;  Service: Orthopedics;  Laterality: Right;   TUBAL LIGATION      Allergies  Allergies[1]  Home Medications    Prior to Admission medications  Medication Sig Start Date End Date Taking? Authorizing Provider  acetaminophen  (TYLENOL ) 500 MG tablet Take 1,000 mg by mouth 2 (two) times daily.    [provider]  aspirin  EC 81 MG tablet Take 1 tablet (81 mg total) by mouth daily. *Swallow whole* 04/24/24   Bair, Luke, MD  carvedilol  (COREG ) 12.5 MG tablet Take 1 tablet (12.5 mg total) by mouth 2 (two) times daily with a meal. 04/24/24   Bair, Luke, MD  clotrimazole  (LOTRIMIN ) 1 % cream Apply 1 application  topically 2 (two) times daily as needed.    [provider]  cyclobenzaprine  (FLEXERIL ) 5 MG tablet Take 2.5-5 mg by mouth 2 (two) times daily as needed. 05/07/24   [provider]  DULoxetine  (CYMBALTA ) 20 MG capsule Take 1  capsule (20 mg total) by mouth daily. *Take with 30 mg capsule* 09/06/23   Eappen, Saramma, MD  DULoxetine  (CYMBALTA ) 30 MG capsule Take 1 capsule (30 mg total) by mouth daily. *Take with 20 mg capsule.* 10/06/23   Eappen, Saramma, MD  estradiol  (ESTRACE ) 0.01 % CREA vaginal cream Apply 1 gram daily per vagina for 2 weeks and then three times weekly 04/22/24   Nieves Cough, MD  gabapentin  (NEURONTIN ) 300 MG capsule Take 2 capsules (600 mg total) by mouth 2 (two) times daily. 12/14/23     HYDROcodone -acetaminophen  (NORCO/VICODIN) 5-325 MG tablet Take 1 tablet by mouth daily as needed for moderate pain.    [provider]  lurasidone  (LATUDA ) 20 MG TABS tablet Take 1 tablet (  20 mg total) by mouth daily with supper. 04/03/24   Eappen, Saramma, MD  naloxone [facility]) nasal spray 4 mg/0.1 mL Place into the nose. 12/27/21   [provider]  rosuvastatin  (CRESTOR ) 20 MG tablet Take 1 tablet (20 mg total) by mouth daily. 04/24/24   Bair, Kalpana, MD  Semaglutide  (OZEMPIC , 2 MG/DOSE, [place]) Inject 2 mg into the skin every 7 (seven) days.    [provider]  telmisartan  (MICARDIS ) 80 MG tablet Take 1 tablet (80 mg total) by mouth daily. 04/24/24   Bair, Kalpana, MD  traZODone  (DESYREL ) 50 MG tablet Take 1 tablet (50 mg total) by mouth at bedtime as needed for sleep. for sleep Patient taking differently: Take 50 mg by mouth at bedtime as needed for sleep. for sleep 05/29/24 taking daily at bedtime 05/07/24   Eappen, Saramma, MD  Vibegron  (GEMTESA ) 75 MG TABS Take 1 tablet (75 mg total) by mouth daily. 04/24/24   Bair, Kalpana, MD  vitamin B-12 (CYANOCOBALAMIN ) 100 MCG tablet Take 1 tablet (100 mcg total) by mouth daily. 12/22/23   Abbey Bruckner, MD    Physical Exam    Vital Signs:  Susan [name] does not have vital signs available for review today.  Given telephonic nature of communication, physical exam is limited. AAOx3. NAD. Normal affect.  Speech and respirations are  unlabored.  Accessory Clinical Findings    None  Assessment & Plan    1.  Preoperative Cardiovascular Risk Assessment:THORACIC LAMINECTOMY FOR SPINAL CORD STIMULATOR PLACEMENT   Date of Surgery:  Clearance 06/19/24                                  Surgeon:  DR. REEVES DAISY Surgeon's Group or Practice Name:  [facility] Phone number:  [phone] Fax number:  [phone]      Primary Cardiologist: Redell Cave, MD  Chart reviewed as part of pre-operative protocol coverage. Given past medical history and time since last visit, based on ACC/AHA guidelines, Susan [name] would be at acceptable risk for the planned procedure without further cardiovascular testing.   Patient was advised that if she develops new symptoms prior to surgery to contact our office to arrange a follow-up appointment.  He verbalized understanding.  Her aspirin  should be continued throughout her perioperative procedure.  Her Ozempic  may be held for 7 days prior to her procedure.  I will route this recommendation to the requesting party via Epic fax function and remove from pre-op pool.       Time:   Today, I have spent 7 minutes with the patient with telehealth technology discussing medical history, symptoms, and management plan.  I spent 10 minutes reviewing patient's past cardiac history and cardiac medications.    Susan CHRISTELLA Beauvais, NP  06/10/2024, 8:40 AM     [1]  Allergies Allergen Reactions   Amlodipine      Lower leg edema    Wellbutrin  [Bupropion ]     Patient reports made  deathly sick  years ago at appointment 03/18/20.   Furosemide Other (See Comments)    Electrolyte imbalance  Other Reaction(s): Other (See Comments)  Electrolyte imbalance Electrolyte imbalance Electrolyte imbalance    Electrolyte imbalance   Hydrochlorothiazide  Other (See Comments)    Electrolyte imbalance  Other Reaction(s): Dizziness, Other (See Comments)   "

## 2024-06-12 ENCOUNTER — Inpatient Hospital Stay
Admission: RE | Admit: 2024-06-12 | Discharge: 2024-06-12 | Disposition: A | Source: Ambulatory Visit | Attending: Neurosurgery | Admitting: Neurosurgery

## 2024-06-12 ENCOUNTER — Other Ambulatory Visit: Payer: Self-pay

## 2024-06-12 VITALS — BP 142/80 | HR 72 | Temp 98.3°F | Resp 18 | Ht 62.0 in | Wt 230.0 lb

## 2024-06-12 DIAGNOSIS — Z9889 Other specified postprocedural states: Secondary | ICD-10-CM | POA: Insufficient documentation

## 2024-06-12 DIAGNOSIS — Z01812 Encounter for preprocedural laboratory examination: Secondary | ICD-10-CM | POA: Insufficient documentation

## 2024-06-12 DIAGNOSIS — Z01818 Encounter for other preprocedural examination: Secondary | ICD-10-CM

## 2024-06-12 DIAGNOSIS — M961 Postlaminectomy syndrome, not elsewhere classified: Secondary | ICD-10-CM | POA: Insufficient documentation

## 2024-06-12 DIAGNOSIS — E1121 Type 2 diabetes mellitus with diabetic nephropathy: Secondary | ICD-10-CM | POA: Insufficient documentation

## 2024-06-12 LAB — BASIC METABOLIC PANEL WITH GFR
Anion gap: 13 (ref 5–15)
BUN: 10 mg/dL (ref 8–23)
CO2: 24 mmol/L (ref 22–32)
Calcium: 9.1 mg/dL (ref 8.9–10.3)
Chloride: 103 mmol/L (ref 98–111)
Creatinine, Ser: 1.07 mg/dL — ABNORMAL HIGH (ref 0.44–1.00)
GFR, Estimated: 58 mL/min — ABNORMAL LOW
Glucose, Bld: 62 mg/dL — ABNORMAL LOW (ref 70–99)
Potassium: 3.9 mmol/L (ref 3.5–5.1)
Sodium: 139 mmol/L (ref 135–145)

## 2024-06-12 LAB — URINALYSIS, COMPLETE (UACMP) WITH MICROSCOPIC
Bilirubin Urine: NEGATIVE
Glucose, UA: NEGATIVE mg/dL
Hgb urine dipstick: NEGATIVE
Ketones, ur: NEGATIVE mg/dL
Nitrite: NEGATIVE
Protein, ur: NEGATIVE mg/dL
RBC / HPF: 0 RBC/hpf (ref 0–5)
Specific Gravity, Urine: 1.006 (ref 1.005–1.030)
pH: 6 (ref 5.0–8.0)

## 2024-06-12 LAB — TYPE AND SCREEN
ABO/RH(D): B POS
Antibody Screen: NEGATIVE

## 2024-06-12 LAB — SURGICAL PCR SCREEN
MRSA, PCR: NEGATIVE
Staphylococcus aureus: NEGATIVE

## 2024-06-12 NOTE — Patient Instructions (Addendum)
 Your procedure is scheduled on:  Wednesday February 11 Report to the Registration Desk on the 1st floor of the [facility] To find out your arrival time, please call [phone] between 1PM - 3PM on:  TUESDAY February 10  If your arrival time is 6:00 am, do not arrive before that time as the [facility] entrance doors do not open until 6:00 am.  REMEMBER: Instructions that are not followed completely may result in serious medical risk, up to and including death; or upon the discretion of your surgeon and anesthesiologist your surgery may need to be rescheduled.  Do not eat food after midnight the night before surgery.  No gum chewing or hard candies.  You may however, drink WATER  up to 2 hours before you are scheduled to arrive for your surgery. Do not drink anything within 2 hours of your scheduled arrival time.   One week prior to surgery: Stop Anti-inflammatories (NSAIDS) such as Advil, Aleve, Ibuprofen, Motrin, Naproxen, Naprosyn and Aspirin  based products such as Excedrin, Goody's Powder, BC Powder. Stop ANY OVER THE COUNTER supplements until after surgery.  You may however, continue to take Tylenol  if needed for pain up until the day of surgery.  **Follow guidelines for insulin  and diabetes medications.** Semaglutide  (OZEMPIC ) hold 7 days prior to surgery, last dose TUESDAY FEBRUARY 3    Continue taking all of your other prescription medications up until the day of surgery.  ON THE DAY OF SURGERY ONLY TAKE THESE MEDICATIONS WITH SIPS OF WATER :  DULoxetine  (CYMBALTA )  Vibegron  (GEMTESA )   No Alcohol for 24 hours before or after surgery.  No Smoking including e-cigarettes for 24 hours before surgery.   Do not use any recreational drugs for at least a week (preferably 2 weeks) before your surgery.  Please be advised that the combination of cocaine and anesthesia may have negative outcomes, up to and including death. If you test positive for cocaine, your surgery will  be cancelled.  On the morning of surgery brush your teeth with toothpaste and water , you may rinse your mouth with mouthwash if you wish. Do not swallow any toothpaste or mouthwash.  Use CHG Soap as directed on instruction sheet.  Do not wear jewelry, make-up, hairpins, clips or nail polish.  For welded (permanent) jewelry: bracelets, anklets, waist bands, etc.  Please have this removed prior to surgery.  If it is not removed, there is a chance that hospital personnel will need to cut it off on the day of surgery.  Do not wear lotions, powders, or perfumes.   Do not shave body hair from the neck down 48 hours before surgery.  Contact lenses, hearing aids and dentures may not be worn into surgery.  Do not bring valuables to the hospital. [facility] is not responsible for any missing/lost belongings or valuables.   Notify your doctor if there is any change in your medical condition (cold, fever, infection).  Wear comfortable clothing (specific to your surgery type) to the hospital.  After surgery, you can help prevent lung complications by doing breathing exercises.  Take deep breaths and cough every 1-2 hours.   If you are being discharged the day of surgery, you will not be allowed to drive home. You will need a responsible individual to drive you home and stay with you for 24 hours after surgery.   If you are taking public transportation, you will need to have a responsible individual with you.  Please call the Pre-admissions Testing Dept. at [phone]  [phone] if you have any questions about these instructions.  Surgery Visitation Policy:  Patients having surgery or a procedure may have two visitors.  Children under the age of 73 must have an adult with them who is not the patient.   Merchandiser, Retail to address health-related social needs:  [email or web address]     Pre-operative 4 CHG Bath Instructions   You can play a key role in reducing the risk  of infection after surgery. Your skin needs to be as free of germs as possible. You can reduce the number of germs on your skin by washing with CHG (chlorhexidine  gluconate) soap before surgery. CHG is an antiseptic soap that kills germs and continues to kill germs even after washing.   DO NOT use if you have an allergy to chlorhexidine /CHG or antibacterial soaps. If your skin becomes reddened or irritated, stop using the CHG and notify one of our RNs at [phone].   Please shower with the CHG soap starting 4 days before surgery using the following schedule:  STARTING SATURDAY FEBRUARY 7    Please keep in mind the following:  DO NOT shave, including legs and underarms, starting the day of your first shower.   You may shave your face at any point before/day of surgery.  Place clean sheets on your bed the day you start using CHG soap. Use a clean washcloth (not used since being washed) for each shower. DO NOT sleep with pets once you start using the CHG.   CHG Shower Instructions:  If you choose to wash your hair and private area, wash first with your normal shampoo/soap.  After you use shampoo/soap, rinse your hair and body thoroughly to remove shampoo/soap residue.  Turn the water  OFF and apply about 3 tablespoons (45 ml) of CHG soap to a CLEAN washcloth.  Apply CHG soap ONLY FROM YOUR NECK DOWN TO YOUR TOES (washing for 3-5 minutes)  DO NOT use CHG soap on face, private areas, open wounds, or sores.  Pay special attention to the area where your surgery is being performed.  If you are having back surgery, having someone wash your back for you may be helpful. Wait 2 minutes after CHG soap is applied, then you may rinse off the CHG soap.  Pat dry with a clean towel  Put on clean clothes/pajamas   If you choose to wear lotion, please use ONLY the CHG-compatible lotions on the back of this paper.     Additional instructions for the day of surgery: DO NOT APPLY any lotions, deodorants,  cologne, or perfumes.   Put on clean/comfortable clothes.  Brush your teeth.  Ask your nurse before applying any prescription medications to the skin.      CHG Compatible Lotions   Aveeno Moisturizing lotion  Cetaphil Moisturizing Cream  Cetaphil Moisturizing Lotion  Clairol Herbal Essence Moisturizing Lotion, Dry Skin  Clairol Herbal Essence Moisturizing Lotion, Extra Dry Skin  Clairol Herbal Essence Moisturizing Lotion, Normal Skin  Curel Age Defying Therapeutic Moisturizing Lotion with Alpha Hydroxy  Curel Extreme Care Body Lotion  Curel Soothing Hands Moisturizing Hand Lotion  Curel Therapeutic Moisturizing Cream, Fragrance-Free  Curel Therapeutic Moisturizing Lotion, Fragrance-Free  Curel Therapeutic Moisturizing Lotion, Original Formula  Eucerin Daily Replenishing Lotion  Eucerin Dry Skin Therapy Plus Alpha Hydroxy Crme  Eucerin Dry Skin Therapy Plus Alpha Hydroxy Lotion  Eucerin Original Crme  Eucerin Original Lotion  Eucerin Plus Crme Eucerin Plus Lotion  Eucerin TriLipid Replenishing Lotion  Keri Anti-Bacterial Hand Lotion  Keri Deep Conditioning Original Lotion Dry Skin Formula Softly Scented  Keri Deep Conditioning Original Lotion, Fragrance Free Sensitive Skin Formula  Keri Lotion Fast Absorbing Fragrance Free Sensitive Skin Formula  Keri Lotion Fast Absorbing Softly Scented Dry Skin Formula  Keri Original Lotion  Keri Skin Renewal Lotion Keri Silky Smooth Lotion  Keri Silky Smooth Sensitive Skin Lotion  Nivea Body Creamy Conditioning Oil  Nivea Body Extra Enriched Teacher, Adult Education Moisturizing Lotion Nivea Crme  Nivea Skin Firming Lotion  NutraDerm 30 Skin Lotion  NutraDerm Skin Lotion  NutraDerm Therapeutic Skin Cream  NutraDerm Therapeutic Skin Lotion  ProShield Protective Hand Cream  Provon moisturizing lotion

## 2024-06-19 ENCOUNTER — Encounter: Payer: Self-pay | Admitting: Urgent Care

## 2024-06-19 ENCOUNTER — Ambulatory Visit: Admission: RE | Admit: 2024-06-19 | Admitting: Neurosurgery

## 2024-06-19 ENCOUNTER — Encounter: Admission: RE | Payer: Self-pay | Source: Ambulatory Visit

## 2024-07-02 ENCOUNTER — Encounter: Admitting: Orthopedic Surgery

## 2024-08-01 ENCOUNTER — Encounter: Admitting: Neurosurgery

## 2024-08-12 ENCOUNTER — Ambulatory Visit: Admitting: Psychiatry

## 2024-10-07 ENCOUNTER — Ambulatory Visit: Admitting: Urology

## 2025-04-08 ENCOUNTER — Ambulatory Visit
